# Patient Record
Sex: Female | Born: 1955 | Race: White | Hispanic: No | Marital: Single | Smoking: Never smoker
Health system: Southern US, Community
[De-identification: ages and names within clinical notes are randomized; demographics above are authoritative.]

## PROBLEM LIST (undated history)

## (undated) DIAGNOSIS — E039 Hypothyroidism, unspecified: Secondary | ICD-10-CM

## (undated) DIAGNOSIS — E119 Type 2 diabetes mellitus without complications: Secondary | ICD-10-CM

## (undated) DIAGNOSIS — I10 Essential (primary) hypertension: Secondary | ICD-10-CM

## (undated) DIAGNOSIS — S81809A Unspecified open wound, unspecified lower leg, initial encounter: Secondary | ICD-10-CM

## (undated) DIAGNOSIS — N200 Calculus of kidney: Secondary | ICD-10-CM

## (undated) DIAGNOSIS — I890 Lymphedema, not elsewhere classified: Secondary | ICD-10-CM

## (undated) NOTE — *Deleted (*Deleted)
[facility]  Name: [name]  MRN: [id number] DOB: Sep 18, 1955   Chief Complaint/ HPI  [name],  73 y.o. , female presents for their Initial CCM visit with the clinical pharmacist via telephone due to COVID-19 Pandemic.  PCP : Juline Patch, MD Patient Care Team: Juline Patch, MD as PCP - General (Family Medicine) Ubaldo Glassing Javier Docker, MD as Consulting Physician (Cardiology) Vladimir Faster, [facility] (Pharmacist)  Their chronic conditions include: Hypertension, Hyperlipidemia, Diabetes, Chronic Kidney Disease, Hypothyroidism and Osteoarthritis   Office Visits: 04/30/20- Dr. Ronnald Ramp-  11/05/19- Dr. Ronnald Ramp - bloodwork  Consult Visit: 05/30/19- Cardiology - SOB OE, echo recommended possible HF, determine LV function, ? Lymphedema, compression hose, suggest empagliflozin  No Known Allergies  Medications: Outpatient Encounter Medications as of 05/12/2020  Medication Sig  . acetaminophen (TYLENOL) 500 MG tablet Take 500 mg by mouth every 6 (six) hours as needed.  Marland Kitchen aspirin EC 81 MG tablet Take 1 tablet (81 mg total) by mouth daily.  Marland Kitchen atorvastatin (LIPITOR) 20 MG tablet Take 1 tablet (20 mg total) by mouth daily.  . cloNIDine (CATAPRES) 0.3 MG tablet TAKE ONE TABLET BY MOUTH 3 TIMES DAILY.  . furosemide (LASIX) 40 MG tablet Take 1 tablet (40 mg total) by mouth 2 (two) times daily.  Marland Kitchen glipiZIDE (GLUCOTROL) 5 MG tablet TAKE 1 TABLET BY MOUTH twice a day  . glucose blood test strip Use as instructed  . levothyroxine (SYNTHROID) 125 MCG tablet Take 1 tablet (125 mcg total) by mouth daily before breakfast.  . metoprolol succinate (TOPROL-XL) 50 MG 24 hr tablet Take 1 tablet (50 mg total) by mouth daily.  Marland Kitchen telmisartan (MICARDIS) 80 MG tablet TAKE ONE TABLET BY MOUTH EVERY DAY   No facility-administered encounter medications on file as of 05/12/2020.     Current Diagnosis/Assessment:    Goals Addressed   None       Diabetes   A1c goal <7%   Recent Relevant Labs: Lab Results  Component Value Date/Time   HGBA1C 8.2 (H) 04/30/2020 02:08 PM   HGBA1C 7.4 (H) 11/05/2019 03:44 PM   CMP Latest Ref Rng & Units 04/30/2020 11/05/2019 04/30/2019  Glucose 65 - 99 mg/dL 147(H) 106(H) 128(H)  BUN 8 - 27 mg/dL 28(H) 25 21  Creatinine 0.57 - 1.00 mg/dL 1.58(H) 1.53(H) 1.43(H)  Sodium 134 - 144 mmol/L 136 137 137  Potassium 3.5 - 5.2 mmol/L 5.0 4.8 4.4  Chloride 96 - 106 mmol/L 97 102 101  CO2 20 - 29 mmol/L 21 24 19(L)  Calcium 8.7 - 10.3 mg/dL 9.6 9.2 9.6  Total Protein 6.0 - 8.5 g/dL - 7.8 -  Total Bilirubin 0.0 - 1.2 mg/dL - 0.7 -  Alkaline Phos 39 - 117 IU/L - 88 -  AST 0 - 40 IU/L - 15 -  ALT 0 - 32 IU/L - 9 -   EGFR~33 mL/min  Last diabetic Eye exam: No results found for: HMDIABEYEEXA  Last diabetic Foot exam: No results found for: HMDIABFOOTEX   Checking BG: Daily usually before breakfast She does not check post-prandial Recent FBG Readings: 133 today. Reports her readings are usually in the 130's  She reports episodes of hypoglycemia with most recent 2-3 weeks ago. She states she became "shaky and hungry". Her BG at that time was 89.  Patient is currently uncontrolled on the following medications: Marland Kitchen Glipizide 5 mg bid  We discussed: diet and exercise extensively and signs/symptoms and treatment of hypoglycemia. Patients endorsed  eating too much bread. She does not cook and often eats packaged [employer] or [employer] or sandwiches. She reports eating more sandwiches than usual because tomatoes are in season and she loves tomato sandwiches. She does read labels and tries to purchase items with the least sodium. She drinks two 16-ounce diet, caffeine-free Mountain Dews daily and equally as much water. We discussed the importance of checking post-prandials to help guide future therapy.  We discussed the upward trend in her [id number] and complications associated with uncontrolled diabetes. She is very concerned about her kidney  function and her heart. She does currently exercise due to back pain which causes her walk hunched over. She uses a walker at baseline.Patient would benefit from transition to Empagliflozin. We discussed the benefits and risks including weight loss, minimal hypoglycemia, reduced risk cardiovascular death as well as increased UTI and yeast infections.  Patient does not want to go on any type of injectable therapy including insulin.  Plan  Recommend follow-up HbA1c 3-6 months from last reading given result above goal and upward trend. Consider changing to Empagliflozin at next appointment if eGFR still >30 ml/min.    Hypertension/CKD IV/EDEMA   BP goal is:  <130/80  Office blood pressures are  BP Readings from Last 3 Encounters:  04/30/20 130/80  11/05/19 130/84  04/30/19 (!) 160/90   BMP Latest Ref Rng & Units 04/30/2020 11/05/2019 04/30/2019  Glucose 65 - 99 mg/dL 147(H) 106(H) 128(H)  BUN 8 - 27 mg/dL 28(H) 25 21  Creatinine 0.57 - 1.00 mg/dL 1.58(H) 1.53(H) 1.43(H)  BUN/Creat Ratio 12 - 28 18 16 15   Sodium 134 - 144 mmol/L 136 137 137  Potassium 3.5 - 5.2 mmol/L 5.0 4.8 4.4  Chloride 96 - 106 mmol/L 97 102 101  CO2 20 - 29 mmol/L 21 24 19(L)  Calcium 8.7 - 10.3 mg/dL 9.6 9.2 9.6    Patient checks BP at home infrequently Patient states her monitor needs batteries and can not remember the last time she checked it at home.  Patient has failed these meds in the past: lopressor 25 mg Patient is currently controlled on the following medications:   Clonidine 0.3 tid  Furosemide 40 mg bid  Metoprolol succinate 50 mg bid  Telmisartan 80 mg qd  We discussed diet and exercise extensively. Patient does not cook and eats a lot of packaged meals. She has not followed up with cardiology or scheduled echocardiogram. She wears Juxtalite compression wraps and sits in recliner with legs elevated most of the time. Does not have scales at home. BP monitor needs batteries. She will get batteries  and begin checking BP.  Plan  Continue current medications.  Recommend patient follow up with cardiology for echo.    Hyperlipidemia   LDL goal < 70  Lipid Panel     Component Value Date/Time   CHOL 205 (H) 04/30/2020 1408   TRIG 112 04/30/2020 1408   HDL 49 04/30/2020 1408   LDLCALC 136 (H) 04/30/2020 1408    Hepatic Function Latest Ref Rng & Units 04/30/2020 11/05/2019 04/30/2019  Total Protein 6.0 - 8.5 g/dL - 7.8 -  Albumin 3.8 - 4.8 g/dL 4.1 4.0 4.0  AST 0 - 40 IU/L - 15 -  ALT 0 - 32 IU/L - 9 -  Alk Phosphatase 39 - 117 IU/L - 88 -  Total Bilirubin 0.0 - 1.2 mg/dL - 0.7 -     The 10-year ASCVD risk score Mikey Bussing DC Jr., et al., 2013)  is: 16.7%   Values used to calculate the score:     Age: 20 years     Sex: Female     Is Non-Hispanic African American: No     Diabetic: Yes     Tobacco smoker: No     Systolic Blood Pressure: [id number]     Is BP treated: Yes     HDL Cholesterol: 49 mg/dL     Total Cholesterol: 205 mg/dL   Patient has failed these meds in past: N/A Patient is currently uncontrolled on the following medications:  . Atorvastatin 10 mg qam . Aspirin 81 mg qam  We discussed:  diet and exercise extensively.  We discussed ASCVD risk calculation and LDL goals in patients with diabetes. Patient has limited mobility due to back pain. Uses a walker.  Plan  Recommend increasing to high intensity atorvastatin 40 mg for 50% reduction in LDL to achieve goal levels.   Consider discontinuing aspirin for primary prevention.  Hypothyroidism   Lab Results  Component Value Date/Time   TSH 0.511 04/30/2020 02:08 PM   TSH 0.258 (L) 11/05/2019 03:44 PM    Patient has failed these meds in past: none Patient is currently uncontrolled on the following medications:  . Levothyroxine 125 mcg qam  We discussed:  Taking first thing each morning 30 minutes before food or other medications. Patient denies any heart palpitations, difficulty falling asleep or excessive  sweating.  Plan  Recommend consider further decrease of levothyroxine to 112 mcg and recheck TSH in 6-8 weeks.  Vaccines   Reviewed and discussed patient's vaccination history.    Immunization History  Administered Date(s) Administered  . Tdap 05/03/2018    Plan  Patient is comtemplating COVID vaccine and is not interested in Shingrix or flu vaccine at this time.   Medication Management   Pt uses [facility] pharmacy for all medications Pt endorses 90-100% compliance  We discussed: Patient has prescriptions delivered and likes using [facility] because it is close to her house.   Plan  Continue current medication management strategy    Follow up: 3 month phone visit  Junita Push. Kenton Kingfisher PharmD, [facility] [phone]

---

## 2008-06-27 DIAGNOSIS — E119 Type 2 diabetes mellitus without complications: Secondary | ICD-10-CM

## 2008-06-27 HISTORY — DX: Type 2 diabetes mellitus without complications: E11.9

## 2010-12-21 ENCOUNTER — Encounter: Payer: Self-pay | Admitting: Nurse Practitioner

## 2010-12-21 ENCOUNTER — Encounter: Payer: Self-pay | Admitting: Cardiothoracic Surgery

## 2010-12-26 ENCOUNTER — Encounter: Payer: Self-pay | Admitting: Nurse Practitioner

## 2011-01-26 ENCOUNTER — Encounter: Payer: Self-pay | Admitting: Nurse Practitioner

## 2011-01-26 ENCOUNTER — Encounter: Payer: Self-pay | Admitting: Cardiothoracic Surgery

## 2011-01-28 ENCOUNTER — Ambulatory Visit: Payer: Self-pay | Admitting: Family Medicine

## 2011-02-24 ENCOUNTER — Ambulatory Visit: Payer: Self-pay | Admitting: Family Medicine

## 2011-02-26 ENCOUNTER — Encounter: Payer: Self-pay | Admitting: Nurse Practitioner

## 2011-02-26 ENCOUNTER — Encounter: Payer: Self-pay | Admitting: Cardiothoracic Surgery

## 2012-02-20 ENCOUNTER — Emergency Department: Payer: Self-pay | Admitting: Emergency Medicine

## 2012-02-20 LAB — COMPREHENSIVE METABOLIC PANEL
Albumin: 3.5 g/dL (ref 3.4–5.0)
Alkaline Phosphatase: 82 U/L (ref 50–136)
BUN: 26 mg/dL — ABNORMAL HIGH (ref 7–18)
Bilirubin,Total: 0.8 mg/dL (ref 0.2–1.0)
Calcium, Total: 9.3 mg/dL (ref 8.5–10.1)
Co2: 27 mmol/L (ref 21–32)
Creatinine: 1.31 mg/dL — ABNORMAL HIGH (ref 0.60–1.30)
EGFR (Non-African Amer.): 45 — ABNORMAL LOW
SGPT (ALT): 22 U/L (ref 12–78)
Sodium: 139 mmol/L (ref 136–145)

## 2012-02-20 LAB — CBC
HCT: 37.6 % (ref 35.0–47.0)
HGB: 12.5 g/dL (ref 12.0–16.0)
MCHC: 33.2 g/dL (ref 32.0–36.0)
MCV: 86 fL (ref 80–100)
RDW: 15.1 % — ABNORMAL HIGH (ref 11.5–14.5)
WBC: 5.3 10*3/uL (ref 3.6–11.0)

## 2012-12-31 ENCOUNTER — Encounter: Payer: Self-pay | Admitting: Cardiothoracic Surgery

## 2012-12-31 ENCOUNTER — Encounter: Payer: Self-pay | Admitting: Nurse Practitioner

## 2013-01-16 LAB — WOUND CULTURE

## 2013-01-25 ENCOUNTER — Encounter: Payer: Self-pay | Admitting: Cardiothoracic Surgery

## 2013-01-25 ENCOUNTER — Encounter: Payer: Self-pay | Admitting: Nurse Practitioner

## 2013-02-25 ENCOUNTER — Encounter: Payer: Self-pay | Admitting: Cardiothoracic Surgery

## 2013-03-27 ENCOUNTER — Encounter: Payer: Self-pay | Admitting: Cardiothoracic Surgery

## 2013-04-10 ENCOUNTER — Encounter: Payer: Self-pay | Admitting: Surgery

## 2013-04-27 ENCOUNTER — Encounter: Payer: Self-pay | Admitting: Cardiothoracic Surgery

## 2013-04-27 ENCOUNTER — Encounter: Payer: Self-pay | Admitting: Surgery

## 2013-05-27 ENCOUNTER — Encounter: Payer: Self-pay | Admitting: Cardiothoracic Surgery

## 2013-06-27 ENCOUNTER — Encounter: Payer: Self-pay | Admitting: Cardiothoracic Surgery

## 2013-07-28 ENCOUNTER — Encounter: Payer: Self-pay | Admitting: Cardiothoracic Surgery

## 2013-08-25 ENCOUNTER — Encounter: Payer: Self-pay | Admitting: Cardiothoracic Surgery

## 2013-09-16 ENCOUNTER — Encounter: Payer: Self-pay | Admitting: Surgery

## 2013-09-25 ENCOUNTER — Encounter: Payer: Self-pay | Admitting: Surgery

## 2013-09-25 ENCOUNTER — Encounter: Payer: Self-pay | Admitting: Cardiothoracic Surgery

## 2013-10-25 ENCOUNTER — Encounter: Payer: Self-pay | Admitting: Cardiothoracic Surgery

## 2013-10-25 ENCOUNTER — Encounter: Payer: Self-pay | Admitting: Surgery

## 2013-11-25 ENCOUNTER — Encounter: Payer: Self-pay | Admitting: Cardiothoracic Surgery

## 2013-12-02 ENCOUNTER — Encounter: Payer: Self-pay | Admitting: Internal Medicine

## 2013-12-16 ENCOUNTER — Encounter: Payer: Self-pay | Admitting: Surgery

## 2013-12-25 ENCOUNTER — Encounter: Payer: Self-pay | Admitting: Surgery

## 2013-12-25 ENCOUNTER — Encounter: Payer: Self-pay | Admitting: Internal Medicine

## 2013-12-25 ENCOUNTER — Encounter: Payer: Self-pay | Admitting: Cardiothoracic Surgery

## 2014-01-25 ENCOUNTER — Encounter: Payer: Self-pay | Admitting: Cardiothoracic Surgery

## 2014-01-25 ENCOUNTER — Encounter: Payer: Self-pay | Admitting: Surgery

## 2014-01-25 ENCOUNTER — Encounter: Payer: Self-pay | Admitting: Internal Medicine

## 2014-02-21 ENCOUNTER — Ambulatory Visit: Payer: Self-pay | Admitting: Family Medicine

## 2014-02-25 ENCOUNTER — Encounter: Payer: Self-pay | Admitting: Surgery

## 2014-02-25 ENCOUNTER — Encounter: Payer: Self-pay | Admitting: Cardiothoracic Surgery

## 2014-03-27 ENCOUNTER — Encounter: Payer: Self-pay | Admitting: Cardiothoracic Surgery

## 2014-04-27 ENCOUNTER — Encounter: Payer: Self-pay | Admitting: Cardiothoracic Surgery

## 2014-05-27 ENCOUNTER — Encounter: Payer: Self-pay | Admitting: Surgery

## 2014-06-27 ENCOUNTER — Encounter: Payer: Self-pay | Admitting: Cardiothoracic Surgery

## 2014-07-28 ENCOUNTER — Encounter: Payer: Self-pay | Admitting: Cardiothoracic Surgery

## 2014-08-18 ENCOUNTER — Encounter: Payer: Self-pay | Admitting: Surgery

## 2014-08-26 ENCOUNTER — Encounter
Admit: 2014-08-26 | Disposition: A | Discharge status: Home / Self Care | Payer: Self-pay | Attending: Surgery | Admitting: Surgery

## 2014-09-26 ENCOUNTER — Encounter
Admit: 2014-09-26 | Disposition: A | Discharge status: Home / Self Care | Payer: Self-pay | Attending: Surgery | Admitting: Surgery

## 2014-10-27 LAB — WOUND AEROBIC CULTURE

## 2014-10-29 ENCOUNTER — Encounter: Payer: Managed Care, Other (non HMO) | Attending: General Surgery | Admitting: Surgery

## 2014-10-29 DIAGNOSIS — E669 Obesity, unspecified: Secondary | ICD-10-CM | POA: Diagnosis not present

## 2014-10-29 DIAGNOSIS — L97219 Non-pressure chronic ulcer of right calf with unspecified severity: Secondary | ICD-10-CM | POA: Insufficient documentation

## 2014-10-29 DIAGNOSIS — I890 Lymphedema, not elsewhere classified: Secondary | ICD-10-CM | POA: Insufficient documentation

## 2014-10-29 DIAGNOSIS — E119 Type 2 diabetes mellitus without complications: Secondary | ICD-10-CM | POA: Diagnosis not present

## 2014-10-29 DIAGNOSIS — B353 Tinea pedis: Secondary | ICD-10-CM | POA: Diagnosis not present

## 2014-10-29 NOTE — Progress Notes (Signed)
MAHAK, LEBEDA ([id number]) Visit Report for 10/29/2014 Arrival Information Details Patient Name: [name], [name]. Date of Service: 10/29/2014 8:15 AM Medical Record Number: [id number] Patient Account Number: [id number] Date of Birth/Sex: 1955-09-21 (59 y.o. Female) Treating [name]: Montey Hora Primary Care Physician: [name] Other Clinician: Referring Physician: referred, self Treating Physician/Extender: BURNS, Charlean Sanfilippo in Treatment: 61 Visit Information History Since Last Visit Added or deleted any medications: No Patient Arrived: Cane Any new allergies or adverse reactions: No Arrival Time: 08:38 Had a fall or experienced change in No Accompanied By: self activities of daily living that may affect Transfer Assistance: None risk of falls: Patient Identification Verified: Yes Signs or symptoms of abuse/neglect since last No Secondary Verification Process Yes visito Completed: Hospitalized since last visit: No Patient Requires Transmission- No Pain Present Now: No Based Precautions: Patient Has Alerts: Yes Patient Alerts: Patient on Blood Thinner Electronic Signature(s) Signed: 10/29/2014 4:34:55 PM By: Montey Hora Entered By: Montey Hora on 10/29/2014 08:38:45 [name] ([id number]) -------------------------------------------------------------------------------- Encounter Discharge Information Details Patient Name: [name]. Date of Service: 10/29/2014 8:15 AM Medical Record Number: [id number] Patient Account Number: [id number] Date of Birth/Sex: [birth year] (59 y.o. Female) Treating [name]: Montey Hora Primary Care Physician: [name] Other Clinician: Referring Physician: referred, self Treating Physician/Extender: BURNS, Charlean Sanfilippo in Treatment: 3 Encounter Discharge Information Items Discharge Pain Level: 0 Discharge Condition: Stable Ambulatory Status: Cane Discharge Destination: Home Transportation: Other Accompanied By:  self Schedule Follow-up Appointment: Yes Medication Reconciliation completed No and provided to Patient/Care Branndon Tuite: Provided on Clinical Summary of Care: 10/29/2014 Form Type Recipient Paper Patient GI Electronic Signature(s) Signed: 10/29/2014 9:56:54 AM By: Ruthine Dose Previous Signature: 10/29/2014 9:55:02 AM Version By: Montey Hora Entered By: Ruthine Dose on 10/29/2014 09:56:54 [name] ([id number]) -------------------------------------------------------------------------------- Lower Extremity Assessment Details Patient Name: [name]. Date of Service: 10/29/2014 8:15 AM Medical Record Number: [id number] Patient Account Number: [id number] Date of Birth/Sex: April 04, 1956 (59 y.o. Female) Treating [name]: Montey Hora Primary Care Physician: [name] Other Clinician: Referring Physician: referred, self Treating Physician/Extender: BURNS, Charlean Sanfilippo in Treatment: 83 Edema Assessment Assessed: [Left: No] [Right: No] E[Left: dema] [Right: :] Calf Left: Right: Point of Measurement: 24 cm From Medial Instep 44.3 cm 40.8 cm Ankle Left: Right: Point of Measurement: 8 cm From Medial Instep 24 cm 23.2 cm Vascular Assessment Pulses: Posterior Tibial Palpable: [Left:Yes] [Right:Yes] Dorsalis Pedis Palpable: [Left:Yes] [Right:Yes] Extremity colors, hair growth, and conditions: Extremity Color: [Left:Hyperpigmented] [Right:Hyperpigmented] Hair Growth on Extremity: [Left:No] [Right:No] Temperature of Extremity: [Left:Warm] [Right:Warm] Capillary Refill: [Left:< 3 seconds] [Right:< 3 seconds] Toe Nail Assessment Left: Right: Thick: Yes Yes Discolored: Yes Yes Deformed: Yes Yes Improper Length and Hygiene: Yes Yes Electronic Signature(s) Signed: 10/29/2014 4:34:55 PM By: Montey Hora Entered By: Montey Hora on 10/29/2014 08:49:31 [name] ([id number]) [name], [name]  ([id number]) -------------------------------------------------------------------------------- Multi Wound Chart Details Patient Name: [name]. Date of Service: 10/29/2014 8:15 AM Medical Record Number: [id number] Patient Account Number: [id number] Date of Birth/Sex: 02-19-1956 (58 y.o. Female) Treating [name]: Baruch Gouty, [name], BSN, Velva Harman Primary Care Physician: [name] Other Clinician: Referring Physician: referred, self Treating Physician/Extender: BURNS, WALTER Weeks in Treatment: 72 Vital Signs Height(in): 65 Pulse(bpm): 57 Weight(lbs): 322 Blood Pressure 207/70 (mmHg): Body Mass Index(BMI): 54 Temperature(F): 97.6 Respiratory Rate 18 (breaths/min): Photos: [45:No Photos] [46:No Photos] [47:No Photos] Wound Location: [45:Right Lower Leg - Lateral, Proximal] [46:Left, Lateral Lower Leg] [47:Right, Medial Lower Leg] Wounding Event: [45:Blister] [46:Blister] [47:Blister] Primary Etiology: [45:Venous Leg Ulcer] [46:Lymphedema] [47:Lymphedema] Comorbid  History: [45:Lymphedema, Hypertension, Type II Diabetes] [46:N/A] [47:N/A] Date Acquired: [45:10/15/2014] [46:10/22/2014] [47:10/22/2014] Weeks of Treatment: [45:2] [46:1] [47:1] Wound Status: [45:Open] [46:Open] [47:Open] Measurements L x W x D 0.5x0.8x0.1 [46:0x0x0] [47:1x0.8x0.1] (cm) Area (cm) : [45:0.314] [46:0] [47:0.628] Volume (cm) : [45:0.031] [46:0] [47:0.063] % Reduction in Area: [45:97.50%] [46:100.00%] [47:89.00%] % Reduction in Volume: 97.50% [46:100.00%] [47:89.00%] Classification: [45:Partial Thickness] [46:Partial Thickness] [47:Partial Thickness] HBO Classification: [45:Grade 1] [46:N/A] [47:N/A] Exudate Amount: [45:Small] [46:N/A] [47:N/A] Exudate Type: [45:Serous] [46:N/A] [47:N/A] Exudate Color: [45:amber] [46:N/A] [47:N/A] Wound Margin: [45:Distinct, outline attached] [46:N/A] [47:N/A] Granulation Amount: [45:Large (67-100%)] [46:N/A] [47:N/A] Granulation Quality: [45:Red] [46:N/A] [47:N/A] Necrotic  Amount: [45:None Present (0%)] [46:N/A] [47:N/A] Exposed Structures: [45:Fascia: No Fat: No Tendon: No Muscle: No] [46:N/A] [47:N/A] Joint: No Bone: No Limited to Skin Breakdown Epithelialization: Medium (34-66%) N/A N/A Debridement: N/A N/A N/A Time-Out Taken: N/A N/A N/A Pain Control: N/A N/A N/A Tissue Debrided: N/A N/A N/A Level: N/A N/A N/A Debridement Area (sq N/A N/A N/A cm): Instrument: N/A N/A N/A Bleeding: N/A N/A N/A Procedural Pain: N/A N/A N/A Post Procedural Pain: N/A N/A N/A Debridement Treatment N/A N/A N/A Response: Post Debridement N/A N/A N/A Measurements L x W x D (cm) Post Debridement N/A N/A N/A Volume: (cm) Periwound Skin Texture: Edema: No No Abnormalities Noted No Abnormalities Noted Excoriation: No Induration: No Callus: No Crepitus: No Fluctuance: No Friable: No Rash: No Scarring: No Periwound Skin Maceration: No No Abnormalities Noted No Abnormalities Noted Moisture: Moist: No Dry/Scaly: No Periwound Skin Color: Atrophie Blanche: No No Abnormalities Noted No Abnormalities Noted Cyanosis: No Ecchymosis: No Erythema: No Hemosiderin Staining: No Mottled: No Pallor: No Rubor: No Temperature: No Abnormality N/A N/A Tenderness on No No No Palpation: Wound Preparation: Ulcer Cleansing: Other: N/A N/A soap and water Topical Anesthetic [name], [name] ([id number]) Applied: Other: lidocaine 4% Procedures Performed: N/A N/A N/A Wound Number: 48 N/A N/A Photos: No Photos N/A N/A Wound Location: Right, Lateral Lower Leg N/A N/A Wounding Event: Blister N/A N/A Primary Etiology: Venous Leg Ulcer N/A N/A Comorbid History: N/A N/A N/A Date Acquired: 10/29/2014 N/A N/A Weeks of Treatment: 0 N/A N/A Wound Status: Open N/A N/A Measurements L x W x D 7.5x1.9x0.1 N/A N/A (cm) Area (cm) : 11.192 N/A N/A Volume (cm) : 1.119 N/A N/A % Reduction in Area: N/A N/A N/A % Reduction in Volume: N/A N/A N/A Classification: N/A N/A N/A HBO  Classification: N/A N/A N/A Exudate Amount: N/A N/A N/A Exudate Type: N/A N/A N/A Exudate Color: N/A N/A N/A Wound Margin: N/A N/A N/A Granulation Amount: N/A N/A N/A Granulation Quality: N/A N/A N/A Necrotic Amount: N/A N/A N/A Exposed Structures: N/A N/A N/A Epithelialization: N/A N/A N/A Debridement: Debridement [id number] N/A N/A [id number]) Time-Out Taken: Yes N/A N/A Pain Control: Lidocaine 4% Topical N/A N/A Solution Tissue Debrided: Fibrin/Slough, Exudates N/A N/A Level: Skin/Subcutaneous N/A N/A Tissue Debridement Area (sq 14.25 N/A N/A cm): Instrument: Curette N/A N/A Bleeding: None N/A N/A Procedural Pain: 0 N/A N/A Post Procedural Pain: 0 N/A N/A Debridement Treatment Procedure was tolerated N/A N/A Response: well Post Debridement 7.5x1.9x0.1 N/A N/A Measurements L x W x D (cm) [name], [name] ([id number]) Post Debridement 1.119 N/A N/A Volume: (cm) Periwound Skin Texture: No Abnormalities Noted N/A N/A Periwound Skin No Abnormalities Noted N/A N/A Moisture: Periwound Skin Color: No Abnormalities Noted N/A N/A Temperature: N/A N/A N/A Tenderness on No N/A N/A Palpation: Wound Preparation: N/A N/A N/A Procedures Performed: Debridement N/A N/A Treatment Notes Electronic Signature(s) Signed: 10/29/2014 1:15:46 PM By:  [name], [employer], [name] Entered By: Regan Lemming on 10/29/2014 09:17:49 [name] ([id number]) -------------------------------------------------------------------------------- Multi-Disciplinary Care Plan Details Patient Name: [name] Date of Service: 10/29/2014 8:15 AM Medical Record Number: [id number] Patient Account Number: [id number] Date of Birth/Sex: April 06, 1956 (59 y.o. Female) Treating [name]: Afful, [name], BSN, Velva Harman Primary Care Physician: [name] Other Clinician: Referring Physician: referred, self Treating Physician/Extender: BURNS, Charlean Sanfilippo in Treatment: 79 Active Inactive Nutrition Nursing Diagnoses: Impaired  glucose control: actual or potential Goals: Patient/caregiver verbalizes understanding of need to maintain therapeutic glucose control per primary care physician Date Initiated: 12/16/2013 Goal Status: Active Interventions: Provide education on elevated blood sugars and impact on wound healing Notes: Soft Tissue Infection Nursing Diagnoses: Impaired tissue integrity Goals: Patient will remain free of wound infection Date Initiated: 12/16/2013 Goal Status: Active Interventions: Assess signs and symptoms of infection every visit Notes: Venous Leg Ulcer Nursing Diagnoses: Knowledge deficit related to disease process and management Potential for venous Insuffiency (use before diagnosis confirmed) GoalsDAJANAY, [name] ([id number]) Patient will maintain optimal edema control Date Initiated: 08/18/2014 Goal Status: Active Interventions: Assess peripheral edema status every visit. Compression as ordered Treatment Activities: Therapeutic compression applied : 10/29/2014 Notes: Electronic Signature(s) Signed: 10/29/2014 1:15:46 PM By: Regan Lemming BSN, [name] Entered By: Regan Lemming on 10/29/2014 09:17:38 [name] ([id number]) -------------------------------------------------------------------------------- Patient/Caregiver Education Details Patient Name: [name] Date of Service: 10/29/2014 8:15 AM Medical Record Number: [id number] Patient Account Number: [id number] Date of Birth/Gender: [birth year] (59 y.o. Female) Treating [name]: Montey Hora Primary Care Physician: [name] Other Clinician: Referring Physician: referred, self Treating Physician/Extender: BURNS, Charlean Sanfilippo in Treatment: 74 Education Assessment Education Provided To: Patient Education Topics Provided Venous: Handouts: Other: call Hoyle Sauer r/t compression hose Methods: Demonstration, Explain/Verbal Responses: State content correctly Electronic Signature(s) Signed: 10/29/2014 9:55:25 AM  By: Montey Hora Entered By: Montey Hora on 10/29/2014 09:55:25 [name] ([id number]) -------------------------------------------------------------------------------- Wound Assessment Details Patient Name: [name]. Date of Service: 10/29/2014 8:15 AM Medical Record Number: [id number] Patient Account Number: [id number] Date of Birth/Sex: 1955-11-29 (59 y.o. Female) Treating [name]: Montey Hora Primary Care Physician: [name] Other Clinician: Referring Physician: referred, self Treating Physician/Extender: BURNS, WALTER Weeks in Treatment: 83 Wound Status Wound Number: 27 Primary Venous Leg Ulcer Etiology: Wound Location: Right Lower Leg - Lateral, Proximal Wound Status: Open Wounding Event: Blister Comorbid Lymphedema, Hypertension, Type II History: Diabetes Date Acquired: 10/15/2014 Weeks Of Treatment: 2 Clustered Wound: No Photos Photo Uploaded By: Montey Hora on 10/29/2014 13:07:16 Wound Measurements Length: (cm) 0.5 Width: (cm) 0.8 Depth: (cm) 0.1 Area: (cm) 0.314 Volume: (cm) 0.031 % Reduction in Area: 97.5% % Reduction in Volume: 97.5% Epithelialization: Medium (34-66%) Tunneling: No Undermining: No Wound Description 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 ([id number]) Necrotic Amount: None Present (0%) Fat Layer Exposed: No Tendon Exposed: No Muscle Exposed: No Joint Exposed: No Bone Exposed: No Limited to Skin Breakdown Periwound Skin Texture Texture Color No Abnormalities Noted: No No Abnormalities Noted: No Callus: No Atrophie Blanche: No Crepitus: No Cyanosis: No Excoriation: No Ecchymosis: No Fluctuance: No Erythema:  No Friable: No Hemosiderin Staining: No Induration: No Mottled: No Localized Edema: No Pallor: No Rash: No Rubor: No Scarring: No Temperature / Pain Moisture Temperature: No Abnormality No Abnormalities Noted: No Dry / Scaly:  No Maceration: No Moist: No Wound Preparation Ulcer Cleansing: Other: soap and water , Topical Anesthetic Applied: Other: lidocaine 4%, Treatment Notes Wound #45 (Right, Proximal, Lateral Lower Leg) 1. Cleansed with: Cleanse wound with antibacterial soap and water 2. Anesthetic Topical Lidocaine 4% cream to wound bed prior to debridement 3. Peri-wound Care: Moisturizing lotion 4. Dressing Applied: Iodoflex 5. Secondary Dressing Applied ABD Pad 7. Secured with 4 Layer Compression System - Bilateral Electronic Signature(s) Signed: 10/29/2014 11:43:56 AM By: Montey Hora Previous Signature: 10/29/2014 9:14:02 AM Version By: [name], [name] ([id number]) Entered By: Montey Hora on 10/29/2014 11:43:56 [name] ([id number]) -------------------------------------------------------------------------------- Wound Assessment Details Patient Name: [name]. Date of Service: 10/29/2014 8:15 AM Medical Record Number: [id number] Patient Account Number: [id number] Date of Birth/Sex: [birth year] (59 y.o. Female) Treating [name]: Montey Hora Primary Care Physician: [name] Other Clinician: Referring Physician: referred, self Treating Physician/Extender: BURNS, WALTER Weeks in Treatment: 83 Wound Status Wound Number: 46 Primary Etiology: Lymphedema Wound Location: Left, Lateral Lower Leg Wound Status: Open Wounding Event: Blister Date Acquired: 10/22/2014 Weeks Of Treatment: 1 Clustered Wound: No Photos Photo Uploaded By: Montey Hora on 10/29/2014 13:07:17 Wound Measurements Length: (cm) 0 % Reduction Width: (cm) 0 % Reduction Depth: (cm) 0 Area: (cm) 0 Volume: (cm) 0 in Area: 100% in Volume:  100% Wound Description Classification: Partial Thickness Periwound Skin Texture Texture Color No Abnormalities Noted: No No Abnormalities Noted: No Moisture No Abnormalities Noted: No Electronic Signature(s) Signed: 10/29/2014 4:34:55 PM By: [name], [name] ([id number]) Entered By: Montey Hora on 10/29/2014 08:59:12 [name] ([id number]) -------------------------------------------------------------------------------- Wound Assessment Details Patient Name: [name]. Date of Service: 10/29/2014 8:15 AM Medical Record Number: [id number] Patient Account Number: [id number] Date of Birth/Sex: 1955-10-22 (59 y.o. Female) Treating [name]: Montey Hora Primary Care Physician: [name] Other Clinician: Referring Physician: referred, self Treating Physician/Extender: BURNS, WALTER Weeks in Treatment: 83 Wound Status Wound Number: 47 Primary Lymphedema Etiology: Wound Location: Right Lower Leg - Medial Wound Status: Open Wounding Event: Blister Comorbid Lymphedema, Hypertension, Type II Date Acquired: 10/22/2014 History: Diabetes Weeks Of Treatment: 1 Clustered Wound: No Photos Photo Uploaded By: Montey Hora on 10/29/2014 13:08:02 Wound Measurements Length: (cm) 1 Width: (cm) 0.8 Depth: (cm) 0.1 Area: (cm) 0.628 Volume: (cm) 0.063 % Reduction in Area: 89% % Reduction in Volume: 89% Epithelialization: Small (1-33%) Tunneling: No Undermining: No Wound Description Classification: Partial Thickness Diabetic Severity (Wagner): Grade 1 Wound Margin: Distinct, outline attach Exudate Amount: Medium Exudate Type: Serous Exudate Color: amber Foul Odor After Cleansing: No ed Wound Bed Granulation Amount: Large (67-100%) Exposed Structure Granulation Quality: Red Fascia Exposed: No Necrotic Amount: None Present (0%) Fat Layer Exposed: No [name], [name] ([id number]) Tendon Exposed: No Muscle Exposed: No Joint Exposed: No Bone  Exposed: No Limited to Skin Breakdown Periwound Skin Texture Texture Color No Abnormalities Noted: No No Abnormalities Noted: No Callus: No Atrophie Blanche: No Crepitus: No Cyanosis: No Excoriation: No Ecchymosis: No Fluctuance: No Erythema: No Friable: No Hemosiderin Staining: No Induration: No Mottled: No Localized Edema: No Pallor: No Rash: No Rubor: No Scarring: No Temperature / Pain Moisture Temperature: No Abnormality No Abnormalities Noted: No Dry / Scaly: No Maceration: No Moist: No Wound Preparation Ulcer Cleansing: Other: soap and water, Topical Anesthetic Applied: Other: lidocaine 4%, Treatment Notes Wound #47 (Right, Medial Lower Leg) 1. Cleansed with: Cleanse wound with antibacterial soap and water 2. Anesthetic Topical Lidocaine 4% cream to wound bed prior to debridement 3. Peri-wound Care: Moisturizing  lotion 4. Dressing Applied: Iodoflex 5. Secondary Dressing Applied ABD Pad 7. Secured with 4 Layer Compression System - Bilateral Electronic Signature(s) Signed: 10/29/2014 11:44:33 AM By: Montey Hora Entered By: Montey Hora on 10/29/2014 11:44:33 [name] ([name] ([id number]) -------------------------------------------------------------------------------- Wound Assessment Details Patient Name: [name]. Date of Service: 10/29/2014 8:15 AM Medical Record Number: [id number] Patient Account Number: [id number] Date of Birth/Sex: 1955-08-23 (59 y.o. Female) Treating [name]: Montey Hora Primary Care Physician: [name] Other Clinician: Referring Physician: referred, self Treating Physician/Extender: BURNS, WALTER Weeks in Treatment: 83 Wound Status Wound Number: 59 Primary Venous Leg Ulcer Etiology: Wound Location: Right Lower Leg - Lateral Wound Status: Open Wounding Event: Blister Comorbid Lymphedema, Hypertension, Type II Date Acquired: 10/29/2014 History: Diabetes Weeks Of Treatment:  0 Clustered Wound: No Photos Photo Uploaded By: Montey Hora on 10/29/2014 13:08:03 Wound Measurements Length: (cm) 7.5 Width: (cm) 1.9 Depth: (cm) 0.1 Area: (cm) 11.192 Volume: (cm) 1.119 % Reduction in Area: 0% % Reduction in Volume: 0% Epithelialization: None Tunneling: No Undermining: No Wound Description Classification: Partial Thickness Foul O Diabetic Severity (Wagner): Grade 1 Wound Margin: Distinct, outline attached Exudate Amount: Medium Exudate Type: Serous Exudate Color: amber dor After Cleansing: No Wound Bed Granulation Amount: Large (67-100%) Exposed Structure Granulation Quality: Red Fascia Exposed: No Necrotic Amount: None Present (0%) Fat Layer Exposed: No [name], [name] ([id number]) Tendon Exposed: No Muscle Exposed: No Joint Exposed: No Bone Exposed: No Limited to Skin Breakdown Periwound Skin Texture Texture Color No Abnormalities Noted: No No Abnormalities Noted: No Callus: No Atrophie Blanche: No Crepitus: No Cyanosis: No Excoriation: No Ecchymosis: No Fluctuance: No Erythema: No Friable: No Hemosiderin Staining: No Induration: No Mottled: No Localized Edema: No Pallor: No Rash: No Rubor: No Scarring: No Temperature / Pain Moisture Temperature: No Abnormality No Abnormalities Noted: No Dry / Scaly: No Maceration: No Moist: No Wound Preparation Ulcer Cleansing: Other: soap and water, Topical Anesthetic Applied: Other: lidocaine 4%, Treatment Notes Wound #48 (Right, Lateral Lower Leg) 1. Cleansed with: Cleanse wound with antibacterial soap and water 2. Anesthetic Topical Lidocaine 4% cream to wound bed prior to debridement 3. Peri-wound Care: Moisturizing lotion 4. Dressing Applied: Iodoflex 5. Secondary Dressing Applied ABD Pad 7. Secured with 4 Layer Compression System - Bilateral Electronic Signature(s) Signed: 10/29/2014 11:45:24 AM By: Montey Hora Entered By: Montey Hora on 10/29/2014  11:45:24 [name] ([name] ([id number]) -------------------------------------------------------------------------------- Vitals Details Patient Name: [name] Date of Service: 10/29/2014 8:15 AM Medical Record Number: [id number] Patient Account Number: [id number] Date of Birth/Sex: 1955/09/12 (59 y.o. Female) Treating [name]: Montey Hora Primary Care Physician: [name] Other Clinician: Referring Physician: referred, self Treating Physician/Extender: BURNS, WALTER Weeks in Treatment: 81 Vital Signs Time Taken: 08:39 Temperature (F): 97.6 Height (in): 65 Pulse (bpm): 57 Weight (lbs): 322 Respiratory Rate (breaths/min): 18 Body Mass Index (BMI): 53.6 Blood Pressure (mmHg): 207/70 Reference Range: 80 - 120 mg / dl Electronic Signature(s) Signed: 10/29/2014 4:34:55 PM By: Montey Hora Entered By: Montey Hora on 10/29/2014 08:39:59

## 2014-10-29 NOTE — Progress Notes (Addendum)
MADESYN, PANGELINAN ([id number]) Visit Report for 10/29/2014 Chief Complaint Document Details Patient Name: [name], [name]. Date of Service: 10/29/2014 8:15 AM Medical Record Number: [id number] Patient Account Number: [id number] Date of Birth/Sex: [birth year] (59 y.o. 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 New ulcers on R. All healed on L. Electronic Signature(s) Signed: 10/29/2014 4:29:30 PM By: Loletha Grayer MD Entered By: Loletha Grayer on 10/29/2014 13:40:02 [name] ([id number]) -------------------------------------------------------------------------------- Debridement Details Patient Name: [name] Date of Service: 10/29/2014 8:15 AM Medical Record Number: [id number] Patient Account Number: [id number] Date of Birth/Sex: [birth year] (59 y.o. Female) Treating RN: Montey Hora Primary Care Physician: [name] Other Clinician: Referring Physician: referred, self Treating Physician/Extender: BURNS, Charlean Sanfilippo in Treatment: 83 Debridement Performed for Wound #48 Right,Lateral Lower Leg Assessment: Performed By: Physician BURNS, Teressa Senter., MD Debridement: Debridement Pre-procedure Yes Verification/Time Out Taken: Start Time: 09:15 Pain Control: Lidocaine 4% Topical Solution Level: Skin/Subcutaneous Tissue Total Area Debrided (L x 7.5 (cm) x 1.9 (cm) = 14.25 (cm) W): Tissue and other Non-Viable, Exudate, Fibrin/Slough material debrided: Instrument: Curette Bleeding: None End Time: 09:15 Procedural Pain: 0 Post Procedural Pain: 0 Response to Treatment: Procedure was tolerated well Post Debridement Measurements of Total Wound Length: (cm) 7.5 Width: (cm)  1.9 Depth: (cm) 0.2 Volume: (cm) 2.238 Electronic Signature(s) Signed: 10/29/2014 4:29:30 PM By: Loletha Grayer MD Signed: 10/29/2014 4:34:55 PM By: Montey Hora Previous Signature: 10/29/2014 1:15:46 PM Version By: Regan Lemming BSN, RN Entered By: Loletha Grayer on 10/29/2014 13:39:40 [name] ([id number]) -------------------------------------------------------------------------------- HPI Details Patient Name: [name]. Date of Service: 10/29/2014 8:15 AM Medical Record Number: [id number] Patient Account Number: [id number] Date of Birth/Sex: [birth year] (59 y.o. Female) Treating RN: Montey Hora Primary Care Physician: [name] Other Clinician: Referring Physician: referred, self Treating Physician/Extender: BURNS, WALTER Weeks in Treatment: 46 History of Present Illness HPI Description: 59yo w/ h/o BLE phlebolymphedema, type 2 DM, and obesity. Has a h/o chronic, recurrent BLE calf ulcers. Tolerating 4 layer compression. Ulcers recently healed, but recurred. Infrequently uses lymphedema pump. Awaiting lymphedema clinic visit. In the past has developed recurrent ulcers when compression bandages discontinued. She returns to clinic today and complains of new R calf ulcers. Mild pain. No F/C. Moderate clear drainage. Electronic Signature(s) Signed: 10/29/2014 4:29:30 PM By: Loletha Grayer MD Entered By: Loletha Grayer on 10/29/2014 13:40:40 [name] ([id number]) -------------------------------------------------------------------------------- Physical Exam Details Patient Name: [name] Date of Service: 10/29/2014 8:15 AM Medical Record Number: [id number] Patient Account Number: [id number] Date of Birth/Sex: 01-16-1956 (59 y.o. Female) Treating RN: Montey Hora Primary Care Physician: [name] Other Clinician: Referring Physician: referred, self Treating Physician/Extender: BURNS, WALTER Weeks in Treatment:  56 Constitutional . Pulse regular. Respirations normal and unlabored. Afebrile. . Notes New right lower extremity ulcers. No cellulitis. No left calf ulcerations. Palpable DP bilaterally. Electronic Signature(s) Signed: 10/29/2014 4:29:30 PM By: Loletha Grayer MD Entered By: Loletha Grayer on 10/29/2014 13:41:23 [name] ([id number]) -------------------------------------------------------------------------------- Physician Orders Details Patient Name: [name] Date of Service: 10/29/2014 8:15 AM Medical Record Number: [id number] Patient Account Number: [id number] Date of Birth/Sex: 15-Mar-1956 (59 y.o. Female) Treating RN: Baruch Gouty, RN, BSN, Velva Harman Primary Care Physician: [name] Other Clinician: Referring Physician: referred, self Treating Physician/Extender: BURNS, WALTER Weeks in Treatment: 60  Verbal / Phone Orders: Yes Clinician: Afful, RN, BSN, Velva Harman Read Back and Verified: Yes Diagnosis Coding Wound Cleansing Wound #45 Right,Proximal,Lateral Lower Leg o Cleanse wound with mild soap and water o May shower with protection. Wound #47 Right,Medial Lower Leg o Cleanse wound with mild soap and water o May shower with protection. Wound #48 Right,Lateral Lower Leg o Cleanse wound with mild soap and water o May shower with protection. Skin Barriers/Peri-Wound Care Wound #45 Right,Proximal,Lateral Lower Leg o Barrier cream Wound #47 Right,Medial Lower Leg o Barrier cream Wound #48 Right,Lateral Lower Leg o Barrier cream Primary Wound Dressing Wound #45 Right,Proximal,Lateral Lower Leg o Iodoflex Wound #47 Right,Medial Lower Leg o Iodoflex Wound #48 Right,Lateral Lower Leg o Iodoflex Secondary Dressing Wound #45 Right,Proximal,Lateral Lower Leg o Gauze, ABD and Kerlix/Conform Spalla, Lille K. ([id number]) Wound #47 Right,Medial Lower Leg o Gauze, ABD and Kerlix/Conform Wound #48 Right,Lateral Lower Leg o Gauze,  ABD and Kerlix/Conform Dressing Change Frequency Wound #45 Right,Proximal,Lateral Lower Leg o Change dressing every week Wound #47 Right,Medial Lower Leg o Change dressing every week Wound #48 Right,Lateral Lower Leg o Change dressing every week Follow-up Appointments Wound #45 Right,Proximal,Lateral Lower Leg o Return Appointment in 1 week. Wound #47 Right,Medial Lower Leg o Return Appointment in 1 week. Wound #48 Right,Lateral Lower Leg o Return Appointment in 1 week. Edema Control Wound #45 Right,Proximal,Lateral Lower Leg o 4 Layer Compression System - Bilateral Wound #47 Right,Medial Lower Leg o 4 Layer Compression System - Bilateral Wound #48 Right,Lateral Lower Leg o 4 Layer Compression System - Bilateral Notes Patient given the phone number for Lymphedema Specialist to call her for custom compression fitting. Electronic Signature(s) Signed: 10/29/2014 9:20:25 AM By: Regan Lemming BSN, RN Signed: 10/29/2014 4:29:30 PM By: Loletha Grayer MD Entered By: Regan Lemming on 10/29/2014 09:20:25 [name] ([id number]) -------------------------------------------------------------------------------- Problem List Details Patient Name: [name]. Date of Service: 10/29/2014 8:15 AM Medical Record Number: [id number] Patient Account Number: [id number] Date of Birth/Sex: [birth year] (60 y.o. Female) Treating RN: Montey Hora Primary Care Physician: [name] Other Clinician: Referring Physician: referred, self Treating Physician/Extender: BURNS, Charlean Sanfilippo in Treatment: 74 Active Problems ICD-9 Encounter Code Description Active Date Diagnosis 457.1 Other Lymphedema - excludes congenital, eyelid or vulva 03/27/2013 Yes 459.33 Chronic venous hypertension with ulcer and inflammation 03/27/2013 Yes 707.12 Ulcer of lower limbs, except pressure ulcer; Ulcer of calf 03/27/2013 Yes ICD-10 Encounter Code Description Active Date Diagnosis I89.0  Lymphedema, not elsewhere classified 03/26/2014 Yes E66.9 Obesity, unspecified 03/26/2014 Yes B35.3 Tinea pedis 08/01/2014 Yes 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 08/27/2014 Yes E11.621 Type 2 diabetes mellitus with foot ulcer 10/08/2014 Yes Inactive Problems Resolved Problems ICD-10 Code Description Active Date Resolved Date ARYIAH, NUANEZ ([id number]) I83.022 Varicose veins of left lower extremity with ulcer of calf 03/26/2014 S80.821A Blister (nonthermal), right lower leg, initial encounter 10/16/2014 10/16/2014 L03.115 Cellulitis of right lower limb 10/22/2014 10/22/2014 Electronic Signature(s) Signed: 10/29/2014 4:29:30 PM By: Loletha Grayer MD Entered By: Loletha Grayer on 10/29/2014 13:39:16 [name] ([id number]) -------------------------------------------------------------------------------- Progress Note Details Patient Name: [name]. Date of Service: 10/29/2014 8:15 AM Medical Record Number: [id number] Patient Account Number: [id number] Date of Birth/Sex: Nov 16, 1955 (59 y.o. Female) Treating RN: Montey Hora Primary Care Physician: [name] Other Clinician: Referring Physician: referred, self Treating Physician/Extender: BURNS, Charlean Sanfilippo in Treatment: 49 Subjective Chief Complaint Information obtained from Patient Bilateral lower extremity phlebolymphedema and ulcerations since May 2014. New ulcers on R. All  healed on L. History of Present Illness (HPI) 59yo w/ h/o BLE phlebolymphedema, type 2 DM, and obesity. Has a h/o chronic, recurrent BLE calf ulcers. Tolerating 4 layer compression. Ulcers recently healed, but recurred. Infrequently uses lymphedema pump. Awaiting lymphedema clinic visit. In the past has developed recurrent ulcers when compression bandages discontinued. She returns to clinic today and complains of new R calf ulcers. Mild pain. No F/C. Moderate clear drainage. Objective Constitutional Pulse  regular. Respirations normal and unlabored. Afebrile. Vitals Time Taken: 8:39 AM, Height: 65 in, Weight: 322 lbs, BMI: 53.6, Temperature: 97.6 F, Pulse: 57 bpm, Respiratory Rate: 18 breaths/min, Blood Pressure: 207/70 mmHg. General Notes: New right lower extremity ulcers. No cellulitis. No left calf ulcerations. Palpable DP bilaterally. Integumentary (Hair, Skin) Wound #45 status is Open. Original cause of wound was Blister. The wound is located on the Right,Proximal,Lateral Lower Leg. The wound measures 0.5cm length x 0.8cm width x 0.1cm depth; 0.314cm^2 area and 0.031cm^3 volume. The wound is limited to skin breakdown. There is no tunneling or undermining noted. There is a small amount of serous drainage noted. The wound margin is distinct with the outline attached to the wound base. There is large (67-100%) red granulation within the wound bed. There is no necrotic tissue within the wound bed. The periwound skin appearance did not exhibit: Callus, Shahin, Paloma K. ([id number]) Crepitus, Excoriation, Fluctuance, Friable, Induration, Localized Edema, Rash, Scarring, Dry/Scaly, Maceration, Moist, Atrophie Blanche, Cyanosis, Ecchymosis, Hemosiderin Staining, Mottled, Pallor, Rubor, Erythema. Periwound temperature was noted as No Abnormality. Wound #46 status is Open. Original cause of wound was Blister. The wound is located on the Left,Lateral Lower Leg. The wound measures 0cm length x 0cm width x 0cm depth; 0cm^2 area and 0cm^3 volume. Wound #47 status is Open. Original cause of wound was Blister. The wound is located on the Right,Medial Lower Leg. The wound measures 1cm length x 0.8cm width x 0.1cm depth; 0.628cm^2 area and 0.063cm^3 volume. The wound is limited to skin breakdown. There is no tunneling or undermining noted. There is a medium amount of serous drainage noted. The wound margin is distinct with the outline attached to the wound base. There is large (67-100%) red granulation  within the wound bed. There is no necrotic tissue within the wound bed. The periwound skin appearance did not exhibit: Callus, Crepitus, Excoriation, Fluctuance, Friable, Induration, Localized Edema, Rash, Scarring, Dry/Scaly, Maceration, Moist, Atrophie Blanche, Cyanosis, Ecchymosis, Hemosiderin Staining, Mottled, Pallor, Rubor, Erythema. Periwound temperature was noted as No Abnormality. Wound #48 status is Open. Original cause of wound was Blister. The wound is located on the Right,Lateral Lower Leg. The wound measures 7.5cm length x 1.9cm width x 0.1cm depth; 11.192cm^2 area and 1.119cm^3 volume. The wound is limited to skin breakdown. There is no tunneling or undermining noted. There is a medium amount of serous drainage noted. The wound margin is distinct with the outline attached to the wound base. There is large (67-100%) red granulation within the wound bed. There is no necrotic tissue within the wound bed. The periwound skin appearance did not exhibit: Callus, Crepitus, Excoriation, Fluctuance, Friable, Induration, Localized Edema, Rash, Scarring, Dry/Scaly, Maceration, Moist, Atrophie Blanche, Cyanosis, Ecchymosis, Hemosiderin Staining, Mottled, Pallor, Rubor, Erythema. Periwound temperature was noted as No Abnormality. 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 ([id number]) bilateral lower extremity phlebolymphedema and new right calf ulcerations. Healed on the left. Procedures Wound #48 Wound #48 is a Venous Leg Ulcer located on the Right,Lateral Lower  Leg . There was a Skin/Subcutaneous Tissue Debridement [id number]) debridement with total area of 14.25 sq cm performed by BURNS, Teressa Senter., MD. with the following instrument(s): Curette to remove Non-Viable tissue/material including Exudate and Fibrin/Slough after achieving pain control using Lidocaine 4% Topical Solution. A time out was conducted prior to the start of the procedure. There was no bleeding. The procedure was tolerated well with a pain level of 0 throughout and a pain level of 0 following the procedure. Post Debridement Measurements: 7.5cm length x 1.9cm width x 0.2cm depth; 2.238cm^3 volume. Plan Wound Cleansing: Wound #45 Right,Proximal,Lateral Lower Leg: Cleanse wound with mild soap and water May shower with protection. Wound #47 Right,Medial Lower Leg: Cleanse wound with mild soap and water May shower with protection. Wound #48 Right,Lateral Lower Leg: Cleanse wound with mild soap and water May shower with protection. Skin Barriers/Peri-Wound Care: Wound #45 Right,Proximal,Lateral Lower Leg: Barrier cream Wound #47 Right,Medial Lower Leg: Barrier cream Wound #48 Right,Lateral Lower Leg: Barrier cream Primary Wound Dressing: Wound #45 Right,Proximal,Lateral Lower Leg: Iodoflex Wound #47 Right,Medial Lower Leg: Iodoflex Wound #48 Right,Lateral Lower Leg: Iodoflex Secondary Dressing: Wound #45 Right,Proximal,Lateral Lower Leg: Gauze, ABD and Kerlix/Conform Wound #47 Right,Medial Lower Leg: Gauze, ABD and Kerlix/Conform Wound #48 Right,Lateral Lower Leg: Perillo, Ileen K. ([id number]) Gauze, ABD and Kerlix/Conform Dressing Change Frequency: Wound #45 Right,Proximal,Lateral Lower Leg: Change dressing every week Wound #47 Right,Medial Lower Leg: Change dressing every week Wound #48 Right,Lateral Lower Leg: Change dressing every week Follow-up Appointments: Wound #45 Right,Proximal,Lateral Lower Leg: Return Appointment in 1 week. Wound #47  Right,Medial Lower Leg: Return Appointment in 1 week. Wound #48 Right,Lateral Lower Leg: Return Appointment in 1 week. Edema Control: Wound #45 Right,Proximal,Lateral Lower Leg: 4 Layer Compression System - Bilateral Wound #47 Right,Medial Lower Leg: 4 Layer Compression System - Bilateral Wound #48 Right,Lateral Lower Leg: 4 Layer Compression System - Bilateral General Notes: Patient given the phone number for Lymphedema Specialist to call her for custom compression fitting. cadexomer iodine. 4-layer compression. Lymphedema pump. Trying to arrange for her to get fitted for custom made compression stockings. Electronic Signature(s) Signed: 10/29/2014 4:29:30 PM By: Loletha Grayer MD Entered By: Loletha Grayer on 10/29/2014 13:42:09

## 2014-11-05 ENCOUNTER — Encounter: Payer: Managed Care, Other (non HMO) | Admitting: Surgery

## 2014-11-05 DIAGNOSIS — L97219 Non-pressure chronic ulcer of right calf with unspecified severity: Secondary | ICD-10-CM | POA: Diagnosis not present

## 2014-11-05 NOTE — Progress Notes (Addendum)
TAYLORANN, MCCUEN ([id number]) Visit Report for 11/05/2014 Chief Complaint Document Details Patient Name: [name], [name]. Date of Service: 11/05/2014 3:30 PM Medical Record Number: [id number] Patient Account Number: [id number] Date of Birth/Sex: 12-15-1955 (59 y.o. 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 New ulcers on R. All healed on L. New blister on L. Electronic Signature(s) Signed: 11/06/2014 5:17:27 AM By: Loletha Grayer MD Entered By: Loletha Grayer on 11/06/2014 08:08:03 [name] ([id number]) -------------------------------------------------------------------------------- Debridement Details Patient Name: [name]. Date of Service: 11/05/2014 3:30 PM Medical Record Number: [id number] Patient Account Number: [id number] Date of Birth/Sex: December 23, 1955 (59 y.o. Female) Treating RN: Cornell Barman Primary Care Physician: [name] Other Clinician: Referring Physician: [name] Treating Physician/Extender: Suella Grove in Treatment: 84 Debridement Performed for Wound #48 Right,Lateral Lower Leg Assessment: Performed By: Physician , Debridement: Debridement Pre-procedure Yes Verification/Time Out Taken: Start Time: 16:20 Pain Control: Lidocaine 4% Topical Solution Level: Skin/Subcutaneous Tissue Total Area Debrided (L x 1.2 (cm) x 8 (cm) = 9.6 (cm) W): Tissue and other Viable, Non-Viable, Exudate, Fat, Fibrin/Slough, Subcutaneous material debrided: Instrument: Curette Bleeding: Minimum Hemostasis Achieved: Pressure End Time: 16:21 Procedural Pain: 0 Post Procedural Pain: 0 Response to Treatment: Procedure was tolerated well Post Debridement Measurements of Total Wound Length: (cm)  1.2 Width: (cm) 8 Depth: (cm) 0.2 Volume: (cm) 1.508 Electronic Signature(s) Signed: 11/06/2014 5:17:27 AM By: Loletha Grayer MD Signed: 11/06/2014 5:14:54 PM By: Gretta Cool RN, BSN, Kim RN, BSN Previous Signature: 11/05/2014 5:48:59 PM Version By: Regan Lemming BSN, RN Entered By: Loletha Grayer on 11/06/2014 08:07:44 [name] ([id number]) -------------------------------------------------------------------------------- HPI Details Patient Name: [name]. Date of Service: 11/05/2014 3:30 PM Medical Record Number: [id number] Patient Account Number: [id number] Date of Birth/Sex: [birth year] (59 y.o. Female) Treating RN: Cornell Barman Primary Care Physician: [name] Other Clinician: Referring Physician: [name] Treating Physician/Extender: Suella Grove in Treatment: 36 History of Present Illness HPI Description: 59yo w/ h/o BLE phlebolymphedema, type 2 DM, and obesity. Has a h/o chronic, recurrent BLE calf ulcers. Tolerating 4 layer compression. Ulcers recently healed, but recurred. Infrequently uses lymphedema pump. Awaiting lymphedema clinic and/or private nurse visit to fit for custom compression stockings. In the past she has developed recurrent ulcers when compression bandages discontinued and juxtalite compression initiated. She returns to clinic today and complains of persistent right calf ulcers and a new left calf blister. Mild pain. No F/C. Moderate clear drainage. Electronic Signature(s) Signed: 11/06/2014 5:17:27 AM By: Loletha Grayer MD Entered By: Loletha Grayer on 11/06/2014 08:11:56 [name] ([id number]) -------------------------------------------------------------------------------- Physical Exam Details Patient Name: [name] Date of Service: 11/05/2014 3:30 PM Medical Record Number: [id number] Patient Account Number: [id number] Date of Birth/Sex: 02-16-1956 (59 y.o. Female) Treating RN: Cornell Barman Primary Care  Physician: [name] Other Clinician: Referring Physician: [name] Treating Physician/Extender: Suella Grove in Treatment: 67 Constitutional . Respiratory WNL. No retractions.. Cardiovascular Pedal Pulses WNL. Integumentary (Hair, Skin) .Marland Kitchen Neurological Sensation normal to touch, pin,and vibration. Psychiatric Judgement and insight Intact.. Oriented times 3.. No evidence of depression, anxiety, or agitation.. Notes Persistent right lower extremity ulcers. Full thickness. No cellulitis. No left calf ulcerations. Palpable DP bilaterally. New 3cm blister on L ant calf. Prepped with betadine and drained with 15 blade. Clear fluid evacuated. Skin viable. No blood loss. Pt tolerated well. Electronic  Signature(s) Signed: 11/06/2014 5:17:27 AM By: Loletha Grayer MD Entered By: Loletha Grayer on 11/06/2014 08:13:49 [name] ([id number]) -------------------------------------------------------------------------------- Physician Orders Details Patient Name: [name] Date of Service: 11/05/2014 3:30 PM Medical Record Number: [id number] Patient Account Number: [id number] Date of Birth/Sex: [birth year] (59 y.o. Female) Treating RN: Afful, RN, BSN, Velva Harman Primary Care Physician: [name] Other Clinician: Referring Physician: [name] Treating Physician/Extender: Suella Grove in Treatment: 58 Verbal / Phone Orders: Yes Clinician: Afful, RN, BSN, Rita Read Back and Verified: Yes Diagnosis Coding ICD-9 Coding Code Description 457.1 Other Lymphedema - excludes congenital, eyelid or vulva 459.33 Chronic venous hypertension with ulcer and inflammation 707.12 Ulcer of lower limbs, except pressure ulcer; Ulcer of calf ICD-10 Coding Code Description I89.0 Lymphedema, not elsewhere classified E66.9 Obesity, unspecified B35.3 Tinea pedis 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 Wound #48 Right,Lateral Lower Leg o Cleanse wound with mild soap and water o May shower with protection. Skin Barriers/Peri-Wound Care Wound #47 Right,Medial Lower Leg o Barrier cream Wound #48 Right,Lateral Lower Leg o Barrier cream Primary Wound Dressing Wound #47 Right,Medial Lower Leg o Iodoflex Moffit, Kayanna K. ([id number]) Wound #48 Right,Lateral Lower Leg o Iodoflex Secondary Dressing Wound #47 Right,Medial Lower Leg o Gauze, ABD and Kerlix/Conform Wound #48 Right,Lateral Lower Leg o Gauze, ABD and Kerlix/Conform Dressing Change Frequency Wound #47 Right,Medial Lower Leg o Change dressing every week Wound #48 Right,Lateral Lower Leg o Change dressing every week Follow-up Appointments Wound #47 Right,Medial Lower Leg o Return Appointment in 1 week. Wound #48 Right,Lateral Lower Leg o Return Appointment in 1 week. Edema Control Wound #47 Right,Medial Lower Leg o 4 Layer Compression System - Bilateral Wound #48 Right,Lateral Lower Leg o 4 Layer Compression System - Bilateral Electronic Signature(s) Signed: 11/05/2014 5:48:59 PM By: Regan Lemming BSN, RN Entered By: Regan Lemming on 11/05/2014 16:34:25 [name] ([id number]) -------------------------------------------------------------------------------- Problem List Details Patient Name: [name]. Date of Service: 11/05/2014 3:30 PM Medical Record Number: [id number] Patient Account Number: [id number] Date of Birth/Sex: [birth year] (59 y.o. Female) Treating RN: Cornell Barman Primary Care Physician: [name] Other Clinician: Referring Physician: [name] Treating Physician/Extender: Suella Grove in Treatment: 29 Active Problems ICD-9 Encounter Code Description Active Date Diagnosis 457.1  Other Lymphedema - excludes congenital, eyelid or vulva 03/27/2013 Yes 459.33 Chronic venous hypertension with ulcer and inflammation 03/27/2013 Yes 707.12 Ulcer of lower limbs, except pressure ulcer; Ulcer of calf 03/27/2013 Yes ICD-10 Encounter Code Description Active Date Diagnosis I89.0 Lymphedema, not elsewhere classified 03/26/2014 Yes E66.9 Obesity, unspecified 03/26/2014 Yes B35.3 Tinea pedis 08/01/2014 Yes 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 ([id number]) Resolved Problems ICD-10 Code Description Active Date Resolved Date I83.022 Varicose veins of left lower extremity with ulcer of calf 03/26/2014 S80.821A Blister (nonthermal), right lower leg, initial encounter 10/16/2014 10/16/2014 L03.115 Cellulitis of right lower limb 10/22/2014 10/22/2014 Electronic Signature(s) Signed: 11/06/2014 5:17:27 AM By: Loletha Grayer MD Previous Signature: 11/05/2014 4:25:44 PM Version By: Loletha Grayer MD Entered By: Loletha Grayer on 11/06/2014 08:07:08 [name] ([id number]) -------------------------------------------------------------------------------- Progress Note Details Patient Name: [name],  [name] Date of Service: 11/05/2014 3:30 PM Medical Record Number: [id number] Patient Account Number: [id number] Date of Birth/Sex: August 29, 1955 (59 y.o. Female) Treating RN: Cornell Barman Primary Care Physician: [name] Other Clinician: Referring Physician: [name] Treating Physician/Extender: Suella Grove in Treatment: 57 Subjective Chief Complaint Information obtained from Patient Bilateral lower extremity phlebolymphedema and ulcerations since May 2014. New ulcers on R. All healed on L. New blister on L. History of Present Illness (HPI) 59yo w/ h/o BLE  phlebolymphedema, type 2 DM, and obesity. Has a h/o chronic, recurrent BLE calf ulcers. Tolerating 4 layer compression. Ulcers recently healed, but recurred. Infrequently uses lymphedema pump. Awaiting lymphedema clinic and/or private nurse visit to fit for custom compression stockings. In the past she has developed recurrent ulcers when compression bandages discontinued and juxtalite compression initiated. She returns to clinic today and complains of persistent right calf ulcers and a new left calf blister. Mild pain. No F/C. Moderate clear drainage. Objective Constitutional Respiratory WNL. No retractions.. Cardiovascular Pedal Pulses WNL. Neurological Sensation normal to touch, pin,and vibration. Psychiatric Judgement and insight Intact.. Oriented times 3.. No evidence of depression, anxiety, or agitation.[name] ([id number]) General Notes: Persistent right lower extremity ulcers. Full thickness. No cellulitis. No left calf ulcerations. Palpable DP bilaterally. New 3cm blister on L ant calf. Prepped with betadine and drained with 15 blade. Clear fluid evacuated. Skin viable. No blood loss. Pt tolerated well. Integumentary (Hair, Skin) Wound #45 status is Open. Original cause of wound was Blister. The wound is located on the Right,Proximal,Lateral Lower Leg. The wound measures 0cm length x 0cm width x 0cm depth; 0cm^2 area and 0cm^3 volume. The wound is limited to skin breakdown. There is a small amount of serous drainage noted. The wound margin is distinct with the outline attached to the wound base. There is large (67-100%) red granulation within the wound bed. There is no necrotic tissue within the wound bed. The periwound skin appearance did not exhibit: Callus, Crepitus, Excoriation, Fluctuance, Friable, Induration, Localized Edema, Rash, Scarring, Dry/Scaly, Maceration, Moist, Atrophie Blanche, Cyanosis, Ecchymosis, Hemosiderin Staining, Mottled, Pallor, Rubor,  Erythema. Periwound temperature was noted as No Abnormality. Wound #47 status is Healed - Epithelialized. Original cause of wound was Blister. The wound is located on the Right,Medial Lower Leg. The wound measures 0cm length x 0cm width x 0cm depth; 0cm^2 area and 0cm^3 volume. The wound is limited to skin breakdown. There is a medium amount of serous drainage noted. The wound margin is distinct with the outline attached to the wound base. There is large (67-100%) red granulation within the wound bed. There is no necrotic tissue within the wound bed. The periwound skin appearance did not exhibit: Callus, Crepitus, Excoriation, Fluctuance, Friable, Induration, Localized Edema, Rash, Scarring, Dry/Scaly, Maceration, Moist, Atrophie Blanche, Cyanosis, Ecchymosis, Hemosiderin Staining, Mottled, Pallor, Rubor, Erythema. Periwound temperature was noted as No Abnormality. Wound #48 status is Open. Original cause of wound was Blister. The wound is located on the Right,Lateral Lower Leg. The wound measures 1.2cm length x 8cm width x 0.1cm depth; 7.54cm^2 area and 0.754cm^3 volume. The wound is limited to skin breakdown. There is a medium amount of serous drainage noted. The wound margin is distinct with the outline attached to the wound base. There is large (67-100%) red granulation within the wound bed. There is no necrotic tissue within the wound bed. The periwound skin appearance did not exhibit: Callus, Crepitus, Excoriation, Fluctuance, Friable, Induration, Localized Edema, Rash, Scarring, Dry/Scaly, Maceration, Moist, Atrophie Blanche, Cyanosis, Ecchymosis, Hemosiderin  Staining, Mottled, Pallor, Rubor, Erythema. Periwound temperature was noted as No Abnormality. Wound #49 status is Open. Original cause of wound was Blister. The wound is located on the Left,Medial Lower Leg. The wound measures 2.9cm length x 3.2cm width x 0.1cm depth; 7.288cm^2 area and 0.729cm^3 volume. The wound is limited to skin  breakdown. The wound margin is flat and intact. There is large (67-100%) granulation within the wound bed. There is no necrotic tissue within the wound bed. The periwound skin appearance did not exhibit: Callus, Crepitus, Excoriation, Fluctuance, Friable, Induration, Localized Edema, Rash, Scarring, Dry/Scaly, Maceration, Moist, Atrophie Blanche, Cyanosis, Ecchymosis, Hemosiderin Staining, Mottled, Pallor, Rubor, Erythema. 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 R calf ulcers. New L calf blister. Procedures Wound #48 Wound #48 is a Venous Leg Ulcer located on the Right,Lateral Lower Leg . There was a Skin/Subcutaneous Tissue Debridement [id number]) debridement with total area of 9.6 sq cm performed by . with the following instrument(s): Curette to remove Viable and Non-Viable tissue/material including Exudate, Fat, Fibrin/Slough, and Subcutaneous after achieving pain control using Lidocaine 4% Topical Solution. A time out was conducted prior to the start of the procedure. A Minimum amount of bleeding was controlled with Pressure. The procedure was tolerated well with a pain level of 0 throughout and a pain level of 0 following the procedure. Post Debridement Measurements: 1.2cm length x 8cm width x 0.2cm depth; 1.508cm^3 volume. Plan Wound Cleansing: Wound #47 Right,Medial Lower Leg: Cleanse wound with mild soap and water May shower with protection. Wound  #48 Right,Lateral Lower Leg: Cleanse wound with mild soap and water May shower with protection. Skin Barriers/Peri-Wound Care: [name], [name] ([id number]) Wound #47 Right,Medial Lower Leg: Barrier cream Wound #48 Right,Lateral Lower Leg: Barrier cream Primary Wound Dressing: Wound #47 Right,Medial Lower Leg: Iodoflex Wound #48 Right,Lateral Lower Leg: Iodoflex Secondary Dressing: Wound #47 Right,Medial Lower Leg: Gauze, ABD and Kerlix/Conform Wound #48 Right,Lateral Lower Leg: Gauze, ABD and Kerlix/Conform Dressing Change Frequency: Wound #47 Right,Medial Lower Leg: Change dressing every week Wound #48 Right,Lateral Lower Leg: Change dressing every week Follow-up Appointments: Wound #47 Right,Medial Lower Leg: Return Appointment in 1 week. Wound #48 Right,Lateral Lower Leg: Return Appointment in 1 week. Edema Control: Wound #47 Right,Medial Lower Leg: 4 Layer Compression System - Bilateral Wound #48 Right,Lateral Lower Leg: 4 Layer Compression System - Bilateral Cadexomer iodine. 4 layer compression. Schedule appointment for fitting of compression stockings. Electronic Signature(s) Signed: 11/06/2014 5:17:27 AM By: Loletha Grayer MD Entered By: Loletha Grayer on 11/06/2014 08:15:14 [name] ([id number]) -------------------------------------------------------------------------------- SuperBill Details Patient Name: [name] Date of Service: 11/05/2014 Medical Record Number: [id number] Patient Account Number: [id number] Date of Birth/Sex: [birth year] (59 y.o. 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 ([id number]) Modifier: Quantity: 1 Electronic Signature(s) Signed: 11/06/2014 5:17:27 AM By: Loletha Grayer MD Previous Signature: 11/05/2014 5:59:15 PM Version By: Gretta Cool RN, BSN, Kim RN, BSN Entered By: Loletha Grayer on 11/06/2014 08:16:30

## 2014-11-06 NOTE — Progress Notes (Signed)
[name], [name] ([id number]) Visit Report for 11/05/2014 Arrival Information Details Patient Name: [name], [name]. Date of Service: 11/05/2014 3:30 PM Medical Record Number: [id number] Patient Account Number: [id number] Date of Birth/Sex: 09/27/1955 (59 y.o. Female) Treating RN: Cornell Barman Primary Care Physician: [name] Other Clinician: Referring Physician: [name] Treating Physician/Extender: Suella Grove in Treatment: 20 Visit Information History Since Last Visit Added or deleted any medications: Yes Patient Arrived: Cane Any new allergies or adverse reactions: No Arrival Time: 16:04 Had a fall or experienced change in No Accompanied By: self activities of daily living that may affect Transfer Assistance: None risk of falls: Patient Identification Verified: Yes Signs or symptoms of abuse/neglect since last No Secondary Verification Process Yes visito Completed: Hospitalized since last visit: No Patient Requires Transmission- No Has Dressing in Place as Prescribed: Yes Based Precautions: Has Compression in Place as Prescribed: Yes Patient Has Alerts: Yes Pain Present Now: No Patient Alerts: Patient on Blood Thinner Electronic Signature(s) Signed: 11/05/2014 5:59:15 PM By: Gretta Cool, RN, BSN, Kim RN, BSN Entered By: Gretta Cool, RN, BSN, Kim on 11/05/2014 16:05:10 [name] ([id number]) -------------------------------------------------------------------------------- Encounter Discharge Information Details Patient Name: [name]. Date of Service: 11/05/2014 3:30 PM Medical Record Number: [id number] Patient Account Number: [id number] Date of Birth/Sex: [birth year] (59 y.o. Female) Treating RN: Cornell Barman Primary Care Physician: [name] Other Clinician: Referring Physician: [name] Treating Physician/Extender: Suella Grove in Treatment: 54 Encounter Discharge Information Items Schedule Follow-up Appointment: No Medication Reconciliation  completed No and provided to Patient/Care Abhay Godbolt: Provided on Clinical Summary of Care: 11/05/2014 Form Type Recipient Paper Patient GI Electronic Signature(s) Signed: 11/05/2014 4:51:52 PM By: Ruthine Dose Entered By: Ruthine Dose on 11/05/2014 16:51:51 [name] ([id number]) -------------------------------------------------------------------------------- Lower Extremity Assessment Details Patient Name: [name]. Date of Service: 11/05/2014 3:30 PM Medical Record Number: [id number] Patient Account Number: [id number] Date of Birth/Sex: December 22, 1955 (59 y.o. Female) Treating RN: Cornell Barman Primary Care Physician: [name] Other Clinician: Referring Physician: [name] Treating Physician/Extender: Suella Grove in Treatment: 84 Edema Assessment Assessed: [Left: No] [Right: No] E[Left: dema] [Right: :] Calf Left: Right: Point of Measurement: 24 cm From Medial Instep 44.5 cm 39.6 cm Ankle Left: Right: Point of Measurement: 8 cm From Medial Instep 23 cm 23.2 cm Vascular Assessment Pulses: Posterior Tibial Dorsalis Pedis Palpable: [Left:Yes] [Right:Yes] Extremity colors, hair growth, and conditions: Extremity Color: [Left:Mottled] [Right:Mottled] Hair Growth on Extremity: [Left:No] [Right:No] Temperature of Extremity: [Left:Cool] [Right:Cool] Capillary Refill: [Left:< 3 seconds] [Right:< 3 seconds] Toe Nail Assessment Left: Right: Thick: Yes Yes Discolored: Yes Yes Deformed: Yes Yes Improper Length and Hygiene: Yes Yes Electronic Signature(s) Signed: 11/05/2014 5:59:15 PM By: Gretta Cool, RN, BSN, Kim RN, BSN Entered By: Gretta Cool, RN, BSN, Kim on 11/05/2014 16:25:58 [name] ([id number]) -------------------------------------------------------------------------------- Multi Wound Chart Details Patient Name: [name]. Date of Service: 11/05/2014 3:30 PM Medical Record Number: [id number] Patient Account Number: [id number] Date of Birth/Sex:  Mar 25, 1956 (58 y.o. Female) Treating RN: Baruch Gouty, RN, BSN, Velva Harman Primary Care Physician: [name] Other Clinician: Referring Physician: [name] Treating Physician/Extender: Suella Grove in Treatment: 84 Wound Assessments Treatment Notes Electronic Signature(s) Signed: 11/05/2014 5:48:59 PM By: Regan Lemming BSN, RN Entered By: Regan Lemming on 11/05/2014 16:30:51 [name] ([id number]) -------------------------------------------------------------------------------- [place] Details Patient Name: [name] Date of Service: 11/05/2014 3:30 PM Medical Record Number: [id number] Patient Account Number: [id number] Date of Birth/Sex: 1956-05-21 (59 y.o. Female) Treating RN: Baruch Gouty, RN, BSN, Velva Harman Primary Care Physician: [name] Other Clinician: Referring Physician: [name]  Treating Physician/Extender: Weeks in Treatment: 74 Active Inactive Nutrition Nursing Diagnoses: Impaired glucose control: actual or potential Goals: Patient/caregiver verbalizes understanding of need to maintain therapeutic glucose control per primary care physician Date Initiated: 12/16/2013 Goal Status: Active Interventions: Provide education on elevated blood sugars and impact on wound healing Notes: Soft Tissue Infection Nursing Diagnoses: Impaired tissue integrity Goals: Patient will remain free of wound infection Date Initiated: 12/16/2013 Goal Status: Active Interventions: Assess signs and symptoms of infection every visit Notes: Venous Leg Ulcer Nursing Diagnoses: Knowledge deficit related to disease process and management Potential for venous Insuffiency (use before diagnosis confirmed) GoalsKADEIDRE, [name] ([id number]) Patient will maintain optimal edema control Date Initiated: 08/18/2014 Goal Status: Active Interventions: Assess peripheral edema status every visit. Compression as ordered Treatment Activities: Therapeutic compression applied :  11/05/2014 Notes: Electronic Signature(s) Signed: 11/05/2014 5:48:59 PM By: Regan Lemming BSN, RN Entered By: Regan Lemming on 11/05/2014 16:30:42 [name] ([id number]) -------------------------------------------------------------------------------- Pain Assessment Details Patient Name: [name]. Date of Service: 11/05/2014 3:30 PM Medical Record Number: [id number] Patient Account Number: [id number] Date of Birth/Sex: [birth year] (59 y.o. Female) Treating RN: Cornell Barman Primary Care Physician: [name] Other Clinician: Referring Physician: [name] Treating Physician/Extender: Suella Grove in Treatment: 85 Active Problems Location of Pain Severity and Description of Pain Patient Has Paino No Site Locations Pain Management and Medication Current Pain Management: Electronic Signature(s) Signed: 11/05/2014 5:59:15 PM By: Gretta Cool, RN, BSN, Kim RN, BSN Entered By: Gretta Cool, RN, BSN, Kim on 11/05/2014 16:05:17 [name] ([id number]) -------------------------------------------------------------------------------- Patient/Caregiver Education Details Patient Name: [name] Date of Service: 11/05/2014 3:30 PM Medical Record Number: [id number] Patient Account Number: [id number] Date of Birth/Gender: 10-Jun-1956 (59 y.o. Female) Treating RN: Montey Hora Primary Care Physician: [name] Other Clinician: Referring Physician: [name] Treating Physician/Extender: Suella Grove in Treatment: 22 Education Assessment Education Provided To: Patient Education Topics Provided Wound/Skin Impairment: Handouts: Other: come in for nurse visit if wrap slips or becomes too tight Methods: Demonstration, Explain/Verbal Responses: State content correctly Electronic Signature(s) Signed: 11/05/2014 5:02:24 PM By: Montey Hora Entered By: Montey Hora on 11/05/2014 17:02:24 [name]  ([id number]) -------------------------------------------------------------------------------- Wound Assessment Details Patient Name: [name]. Date of Service: 11/05/2014 3:30 PM Medical Record Number: [id number] Patient Account Number: [id number] Date of Birth/Sex: 08/09/1955 (59 y.o. Female) Treating RN: Cornell Barman Primary Care Physician: [name] Other Clinician: Referring Physician: [name] Treating Physician/Extender: Suella Grove in Treatment: 84 Wound Status Wound Number: 45 Primary Venous Leg Ulcer Etiology: Wound Location: Right Lower Leg - Lateral, Proximal Wound Status: Open Wounding Event: Blister Comorbid Lymphedema, Hypertension, Type II History: Diabetes Date Acquired: 10/15/2014 Weeks Of Treatment: 3 Clustered Wound: No Photos Photo Uploaded By: Gretta Cool, RN, BSN, Kim on 11/05/2014 17:10:01 Wound Measurements Length: (cm) 0 % Redu Width: (cm) 0 % Redu Depth: (cm) 0 Epithe Area: (cm) 0 Volume: (cm) 0 ction in Area: 100% ction in Volume: 100% lialization: Medium (34-66%) Wound Description 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 ([id number]) Necrotic Amount: None Present (0%) Fat Layer Exposed: No Tendon Exposed: No Muscle Exposed: No Joint Exposed: No Bone Exposed: No Limited to Skin Breakdown Periwound Skin Texture Texture Color No Abnormalities Noted: No No Abnormalities Noted: No Callus: No Atrophie Blanche: No Crepitus: No Cyanosis: No Excoriation: No Ecchymosis:  No Fluctuance: No Erythema: No Friable: No Hemosiderin Staining: No Induration: No Mottled: No Localized Edema: No Pallor: No Rash: No Rubor: No Scarring: No Temperature  / Pain Moisture Temperature: No Abnormality No Abnormalities Noted: No Dry / Scaly: No Maceration: No Moist: No Wound Preparation Ulcer Cleansing: Other: soap and water , Electronic Signature(s) Signed: 11/05/2014 5:59:15 PM By: Gretta Cool, RN, BSN, Kim RN, BSN Entered By: Gretta Cool, RN, BSN, Kim on 11/05/2014 16:33:37 [name] ([id number]) -------------------------------------------------------------------------------- Wound Assessment Details Patient Name: [name]. Date of Service: 11/05/2014 3:30 PM Medical Record Number: [id number] Patient Account Number: [id number] Date of Birth/Sex: 06-Sep-1955 (59 y.o. Female) Treating RN: Cornell Barman Primary Care Physician: [name] Other Clinician: Referring Physician: [name] Treating Physician/Extender: Suella Grove in Treatment: 84 Wound Status Wound Number: 47 Primary Lymphedema Etiology: Wound Location: Right Lower Leg - Medial Wound Status: Healed - Epithelialized Wounding Event: Blister Comorbid Lymphedema, Hypertension, Type II Date Acquired: 10/22/2014 History: Diabetes Weeks Of Treatment: 2 Clustered Wound: No Photos Photo Uploaded By: Gretta Cool, RN, BSN, Kim on 11/05/2014 17:13:38 Wound Measurements Length: (cm) 0 % Redu Width: (cm) 0 % Redu Depth: (cm) 0 Epithe Area: (cm) 0 Volume: (cm) 0 ction in Area: 100% ction in Volume: 100% lialization: Small (1-33%) Wound Description Classification: Partial Thickness Diabetic Severity (Wagner): Grade 1 Wound Margin: Distinct, outline attach Exudate Amount: Medium Exudate Type: Serous Exudate Color: amber Foul Odor After Cleansing: No ed Wound Bed Granulation Amount: Large (67-100%) Exposed Structure Granulation Quality: Red Fascia Exposed: No Necrotic Amount: None Present (0%) Fat Layer Exposed: No [name], [name] ([id number]) Tendon Exposed: No Muscle Exposed: No Joint Exposed: No Bone Exposed: No Limited to Skin Breakdown Periwound Skin  Texture Texture Color No Abnormalities Noted: No No Abnormalities Noted: No Callus: No Atrophie Blanche: No Crepitus: No Cyanosis: No Excoriation: No Ecchymosis: No Fluctuance: No Erythema: No Friable: No Hemosiderin Staining: No Induration: No Mottled: No Localized Edema: No Pallor: No Rash: No Rubor: No Scarring: No Temperature / Pain Moisture Temperature: No Abnormality No Abnormalities Noted: No Dry / Scaly: No Maceration: No Moist: No Wound Preparation Ulcer Cleansing: Other: soap and water, Electronic Signature(s) Signed: 11/05/2014 5:59:15 PM By: Gretta Cool, RN, BSN, Kim RN, BSN Entered By: Gretta Cool, RN, BSN, Kim on 11/05/2014 16:35:57 [name] ([id number]) -------------------------------------------------------------------------------- Wound Assessment Details Patient Name: [name]. Date of Service: 11/05/2014 3:30 PM Medical Record Number: [id number] Patient Account Number: [id number] Date of Birth/Sex: 1955-11-10 (59 y.o. Female) Treating RN: Cornell Barman Primary Care Physician: [name] Other Clinician: Referring Physician: [name] Treating Physician/Extender: Suella Grove in Treatment: 84 Wound Status Wound Number: 48 Primary Venous Leg Ulcer Etiology: Wound Location: Right Lower Leg - Lateral Wound Status: Open Wounding Event: Blister Comorbid Lymphedema, Hypertension, Type II Date Acquired: 10/29/2014 History: Diabetes Weeks Of Treatment: 1 Clustered Wound: No Photos Photo Uploaded By: Gretta Cool, RN, BSN, Kim on 11/05/2014 17:13:38 Wound Measurements Length: (cm) 1.2 Width: (cm) 8 Depth: (cm) 0.1 Area: (cm) 7.54 Volume: (cm) 0.754 % Reduction in Area: 32.6% % Reduction in Volume: 32.6% Epithelialization: None Wound Description Classification: Partial Thickness Foul O Diabetic Severity (Wagner): Grade 1 Wound Margin: Distinct, outline attached Exudate Amount: Medium Exudate Type: Serous Exudate Color: amber dor After  Cleansing: No Wound Bed Granulation Amount: Large (67-100%) Exposed Structure Granulation Quality: Red Fascia Exposed: No Necrotic Amount: None Present (0%) Fat Layer Exposed: No [name], [name] ([id number]) Tendon Exposed: No Muscle Exposed: No Joint Exposed: No Bone Exposed: No Limited to Skin Breakdown Periwound Skin Texture  Texture Color No Abnormalities Noted: No No Abnormalities Noted: No Callus: No Atrophie Blanche: No Crepitus: No Cyanosis: No Excoriation: No Ecchymosis: No Fluctuance: No Erythema: No Friable: No Hemosiderin Staining: No Induration: No Mottled: No Localized Edema: No Pallor: No Rash: No Rubor: No Scarring: No Temperature / Pain Moisture Temperature: No Abnormality No Abnormalities Noted: No Dry / Scaly: No Maceration: No Moist: No Wound Preparation Ulcer Cleansing: Other: soap and water, Topical Anesthetic Applied: None Treatment Notes Wound #48 (Right, Lateral Lower Leg) 1. Cleansed with: Cleanse wound with antibacterial soap and water 4. Dressing Applied: Iodoflex 5. Secondary Dressing Applied ABD and Kerlix/Conform 7. Secured with 4 Layer Compression System - Bilateral Electronic Signature(s) Signed: 11/05/2014 5:59:15 PM By: Gretta Cool, RN, BSN, Kim RN, BSN Entered By: Gretta Cool, RN, BSN, Kim on 11/05/2014 16:36:21 [name] ([id number]) -------------------------------------------------------------------------------- Wound Assessment Details Patient Name: [name] Date of Service: 11/05/2014 3:30 PM Medical Record Number: [id number] Patient Account Number: [id number] Date of Birth/Sex: [birth year] (60 y.o. Female) Treating RN: Cornell Barman Primary Care Physician: [name] Other Clinician: Referring Physician: [name] Treating Physician/Extender: Suella Grove in Treatment: 84 Wound Status Wound Number: 49 Primary Venous Leg Ulcer Etiology: Wound Location: Left Lower Leg - Medial Wound Status: Open Wounding  Event: Blister Comorbid Lymphedema, Hypertension, Type II Date Acquired: 11/03/2014 History: Diabetes Weeks Of Treatment: 0 Clustered Wound: No Photos Photo Uploaded By: Gretta Cool, RN, BSN, Kim on 11/05/2014 17:14:31 Wound Measurements Length: (cm) 2.9 Width: (cm) 3.2 Depth: (cm) 0.1 Area: (cm) 7.288 Volume: (cm) 0.729 % Reduction in Area: 0% % Reduction in Volume: 0% Wound Description Classification: Partial Thickness Diabetic Severity Autumn [place]): Grade 0 Wound Margin: Flat and Intact Wound Bed Granulation Amount: Large (67-100%) Exposed Structure Necrotic Amount: None Present (0%) Fascia Exposed: No Fat Layer Exposed: No Tendon Exposed: No Muscle Exposed: No Joint Exposed: No [name], [name] ([id number]) Bone Exposed: No Limited to Skin Breakdown Periwound Skin Texture Texture Color No Abnormalities Noted: No No Abnormalities Noted: No Callus: No Atrophie Blanche: No Crepitus: No Cyanosis: No Excoriation: No Ecchymosis: No Fluctuance: No Erythema: No Friable: No Hemosiderin Staining: No Induration: No Mottled: No Localized Edema: No Pallor: No Rash: No Rubor: No Scarring: No Moisture No Abnormalities Noted: No Dry / Scaly: No Maceration: No Moist: No Wound Preparation Topical Anesthetic Applied: Other Treatment Notes Wound #49 (Left, Medial Lower Leg) 1. Cleansed with: Cleanse wound with antibacterial soap and water 4. Dressing Applied: Iodoflex 5. Secondary Dressing Applied ABD and Kerlix/Conform 7. Secured with 4 Layer Compression System - Bilateral Electronic Signature(s) Signed: 11/05/2014 5:59:15 PM By: Gretta Cool, RN, BSN, Kim RN, BSN Entered By: Gretta Cool, RN, BSN, Kim on 11/05/2014 16:35:00 [name] ([id number]) -------------------------------------------------------------------------------- Vitals Details Patient Name: [name] Date of Service: 11/05/2014 3:30 PM Medical Record Number: [id number] Patient Account Number:  [id number] Date of Birth/Sex: [birth year] (59 y.o. Female) Treating RN: Cornell Barman Primary Care Physician: [name] Other Clinician: Referring Physician: [name] Treating Physician/Extender: Suella Grove in Treatment: 14 Vital Signs Reference Range: 80 - 120 mg / dl Electronic Signature(s) Signed: 11/05/2014 5:59:15 PM By: Gretta Cool, RN, BSN, Kim RN, BSN Entered By: Gretta Cool, RN, BSN, Kim on 11/05/2014 16:05:18

## 2014-11-12 ENCOUNTER — Encounter: Payer: Managed Care, Other (non HMO) | Admitting: Surgery

## 2014-11-12 DIAGNOSIS — L97219 Non-pressure chronic ulcer of right calf with unspecified severity: Secondary | ICD-10-CM | POA: Diagnosis not present

## 2014-11-12 NOTE — Progress Notes (Addendum)
[name], [name] ([id number]) Visit Report for 11/12/2014 Chief Complaint Document Details Patient Name: [name], [name]. Date of Service: 11/12/2014 8:15 AM Medical Record Number: [id number] Patient Account Number: [id number] Date of Birth/Sex: 1955/11/18 (59 y.o. Female) Treating RN: Primary Care Physician: [name] Other Clinician: Referring Physician: [name] Treating Physician/Extender: BURNS, Charlean Sanfilippo in Treatment: 33 Information Obtained from: Patient Chief Complaint Bilateral lower extremity phlebolymphedema and ulcerations since May 2014. New painful L great toe callus. Electronic Signature(s) Signed: 11/12/2014 3:18:58 PM By: Loletha Grayer MD Entered By: Loletha Grayer on 11/12/2014 14:15:02 [name] ([id number]) -------------------------------------------------------------------------------- HPI Details Patient Name: [name] Date of Service: 11/12/2014 8:15 AM Medical Record Number: [id number] Patient Account Number: [id number] Date of Birth/Sex: 11-Mar-1956 (59 y.o. Female) Treating RN: Primary Care Physician: [name] Other Clinician: Referring Physician: [name] Treating Physician/Extender: BURNS, Charlean Sanfilippo in Treatment: 38 History of Present Illness HPI Description: 59yo w/ h/o BLE phlebolymphedema, type 2 DM, and obesity. Has a h/o chronic, recurrent BLE calf ulcers. Tolerating 4 layer compression. Ulcers recently healed, but recurred. Infrequently uses lymphedema pump. Awaiting lymphedema clinic and/or private nurse visit to fit for custom compression stockings. In the past she has developed recurrent ulcers when compression bandages discontinued and juxtalite compression initiated. She returns to clinic today and complains of a painful callus on L great toe. Mild pain. No F/C. Moderate clear drainage from calf ulcers. Electronic Signature(s) Signed: 11/12/2014 3:18:58 PM By: Loletha Grayer  MD Entered By: Loletha Grayer on 11/12/2014 14:15:55 [name] ([id number]) -------------------------------------------------------------------------------- Physical Exam Details Patient Name: [name] Date of Service: 11/12/2014 8:15 AM Medical Record Number: [id number] Patient Account Number: [id number] Date of Birth/Sex: 29-Jan-1956 (59 y.o. Female) Treating RN: Primary Care Physician: [name] Other Clinician: Referring Physician: [name] Treating Physician/Extender: BURNS, Charlean Sanfilippo in Treatment: 85 Constitutional . Pulse regular. Respirations normal and unlabored. Afebrile. . Notes Small bilateral lower extremity ulcers. Full thickness. No cellulitis. Palpable DP bilaterally. New raised callus on L great toe. Sharply debrided. No underlying ulceration. No blood loss. Patient tolerated well. Electronic Signature(s) Signed: 11/12/2014 3:18:58 PM By: Loletha Grayer MD Entered By: Loletha Grayer on 11/12/2014 14:17:24 [name] ([id number]) -------------------------------------------------------------------------------- Physician Orders Details Patient Name: [name] Date of Service: 11/12/2014 8:15 AM Medical Record Number: [id number] Patient Account Number: [id number] Date of Birth/Sex: Feb 05, 1956 (59 y.o. Female) Treating RN: Baruch Gouty, RN, BSN, Velva Harman Primary Care Physician: [name] Other Clinician: Referring Physician: [name] Treating Physician/Extender: Suella Grove in Treatment: 45 Verbal / Phone Orders: Yes Clinician: Afful, RN, BSN, Rita Read Back and Verified: Yes Diagnosis Coding Wound Cleansing Wound #48 Right,Lateral Lower Leg o Cleanse wound with mild soap and water o May shower with protection. Skin Barriers/Peri-Wound Care Wound #48 Right,Lateral Lower Leg o Barrier cream Primary Wound Dressing Wound #48 Right,Lateral Lower Leg o Iodoflex Secondary Dressing Wound #48 Right,Lateral  Lower Leg o Gauze, ABD and Kerlix/Conform Dressing Change Frequency Wound #48 Right,Lateral Lower Leg o Change dressing every week Follow-up Appointments Wound #48 Right,Lateral Lower Leg o Return Appointment in 1 week. Edema Control Wound #48 Right,Lateral Lower Leg o 4 Layer Compression System - Bilateral Notes Patient will get fitted next visit for custom compression Electronic Signature(s) Signed: 11/12/2014 9:01:02 AM By: Regan Lemming BSN, RN [name] ([id number]) Entered By: Regan Lemming on 11/12/2014 09:01:02 [name] ([id number]) -------------------------------------------------------------------------------- Problem List Details Patient Name: [name]. Date of Service: 11/12/2014 8:15 AM Medical Record  Number: [id number] Patient Account Number: [id number] Date of Birth/Sex: [birth year] (59 y.o. Female) Treating RN: Primary Care Physician: [name] Other Clinician: Referring Physician: [name] Treating Physician/Extender: BURNS, Charlean Sanfilippo in Treatment: 78 Active Problems ICD-9 Encounter Code Description Active Date Diagnosis 457.1 Other Lymphedema - excludes congenital, eyelid or vulva 03/27/2013 Yes 459.33 Chronic venous hypertension with ulcer and inflammation 03/27/2013 Yes 707.12 Ulcer of lower limbs, except pressure ulcer; Ulcer of calf 03/27/2013 Yes ICD-10 Encounter Code Description Active Date Diagnosis I89.0 Lymphedema, not elsewhere classified 03/26/2014 Yes E66.9 Obesity, unspecified 03/26/2014 Yes B35.3 Tinea pedis 08/01/2014 Yes 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 Date of Service: 11/12/2014 8:15 AM Medical Record Number: [id number] Patient Account Number: [id number] Date of Birth/Sex: Jul 05, 1955 (59 y.o. Female) Treating RN: Primary Care Physician: [name] Other Clinician: Referring Physician: [name] Treating Physician/Extender: BURNS, Charlean Sanfilippo in Treatment: 35 Subjective Chief Complaint Information obtained from Patient Bilateral lower extremity phlebolymphedema and ulcerations since May 2014. New painful L great toe callus. History of Present Illness (HPI) 59yo w/ h/o BLE phlebolymphedema, type 2 DM, and obesity. Has a h/o chronic, recurrent BLE calf ulcers. Tolerating 4 layer compression. Ulcers recently healed, but recurred. Infrequently uses lymphedema pump. Awaiting lymphedema clinic and/or private nurse visit to fit for custom compression stockings. In the past she has developed recurrent ulcers when compression bandages discontinued and juxtalite compression initiated. She returns to clinic today and complains of a painful callus on L great toe. Mild pain. No F/C. Moderate clear drainage from calf ulcers. Objective Constitutional Pulse regular. Respirations normal and unlabored. Afebrile. Vitals Time Taken: 8:30 AM, Temperature: 98.0 F, Pulse: 64 bpm, Respiratory Rate: 20 breaths/min, Blood Pressure: 214/72 mmHg. General Notes: CM notified of elevated BP and pt asymptomatic. Pt took her BP medication around 7-7:30 am this morning. Pt is instructed to f/u with her PCP. General Notes: Small bilateral lower extremity ulcers. Full thickness. No cellulitis. Palpable DP bilaterally. New raised callus on L great toe. Sharply debrided. No underlying  ulceration. No blood loss. Patient tolerated well. Integumentary (Hair, Skin) Wound #48 status is Open. Original cause of wound was Blister. The wound is located on the Right,Lateral [place], [name] ([id number]) Lower Leg. The wound measures 0.4cm length x 0.9cm width x 0.1cm depth; 0.283cm^2 area and 0.028cm^3 volume. The wound is limited to skin breakdown. There is no tunneling or undermining noted. There is a medium amount of sanguinous drainage noted. The wound margin is distinct with the outline attached to the wound base. There is medium (34-66%) red granulation within the wound bed. There is no necrotic tissue within the wound bed. The periwound skin appearance exhibited: Localized Edema, Moist, Hemosiderin Staining. The periwound skin appearance did not exhibit: Callus, Crepitus, Excoriation, Fluctuance, Friable, Induration, Rash, Scarring, Dry/Scaly, Maceration, Atrophie Blanche, Cyanosis, Ecchymosis, Mottled, Pallor, Rubor, Erythema. Periwound temperature was noted as No Abnormality. Wound #49 status is Open. Original cause of wound was Blister. The wound is located on the Left,Medial Lower Leg. The wound measures 0.6cm length x 0.4cm width x 0.1cm  depth; 0.188cm^2 area and 0.019cm^3 volume. The wound is limited to skin breakdown. There is no tunneling or undermining noted. There is a small amount of sanguinous drainage noted. The wound margin is flat and intact. There is medium (34-66%) granulation within the wound bed. There is no necrotic tissue within the wound bed. The periwound skin appearance exhibited: Localized Edema, Moist, Hemosiderin Staining. The periwound skin appearance did not exhibit: Callus, Crepitus, Excoriation, Fluctuance, Friable, Induration, Rash, Scarring, Dry/Scaly, Maceration, Atrophie Blanche, Cyanosis, Ecchymosis, Mottled, Pallor, Rubor, Erythema. Periwound temperature was noted as No Abnormality. 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 New L great toe painful callus. Plan [name], [name] ([id number]) Wound Cleansing: Wound #48 Right,Lateral Lower Leg: Cleanse wound with mild soap and water May shower with protection. Skin Barriers/Peri-Wound Care: Wound #48 Right,Lateral Lower Leg: Barrier cream Primary Wound Dressing: Wound #48 Right,Lateral Lower Leg: Iodoflex Secondary Dressing: Wound #48 Right,Lateral Lower Leg: Gauze, ABD and Kerlix/Conform Dressing Change Frequency: Wound #48 Right,Lateral Lower Leg: Change dressing every week Follow-up Appointments: Wound #48 Right,Lateral Lower Leg: Return Appointment in 1 week. Edema Control: Wound #48 Right,Lateral Lower Leg: 4 Layer Compression System - Bilateral General Notes: Patient will get fitted next visit for custom compression Cadexomer iodine and 4-layer compression. Scheduled to be fitted for new compression stockings next week. Electronic Signature(s) Signed: 11/12/2014 3:18:58 PM By: Loletha Grayer MD Entered By: Loletha Grayer on 11/12/2014 14:18:25 [name] ([id number]) -------------------------------------------------------------------------------- SuperBill Details Patient Name: [name] Date of Service: 11/12/2014 Medical Record Number: [id number] Patient Account Number: [id number] Date of Birth/Sex: Feb 18, 1956 (59 y.o.  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 E11.621 Type 2 diabetes mellitus with foot ulcer I83.222 Varicose veins of left lower extremity with both ulcer of calf and inflammation L84 Corns and callosities Facility Procedures CPT4 Code: [id number] Description: [id number] - PARE BENIGN LES; SGL ICD-10 Description Diagnosis L84 Corns and callosities Modifier: Quantity: 1 Physician Procedures CPT4: Description Modifier Quantity Code [id number] - WC PHYS LEVEL 3 - EST PT 1 ICD-10 Description Diagnosis L84 Corns and callosities 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

## 2014-11-13 NOTE — Progress Notes (Signed)
[name] ([id number]) Visit Report for 11/12/2014 Arrival Information Details Patient Name: [name], [name]. Date of Service: 11/12/2014 8:15 AM Medical Record Number: [id number] Patient Account Number: [id number] Date of Birth/Sex: March 31, 1956 (59 y.o. Female) Treating RN: Junious Dresser Primary Care Physician: [name] Other Clinician: Referring Physician: [name] Treating Physician/Extender: BURNS, Charlean Sanfilippo in Treatment: 58 Visit Information History Since Last Visit Added or deleted any medications: No Patient Arrived: Wheel Chair Any new allergies or adverse reactions: No Arrival Time: 08:28 Had a fall or experienced change in No Accompanied By: self activities of daily living that may affect Transfer Assistance: None risk of falls: Patient Identification Verified: Yes Signs or symptoms of abuse/neglect since last No Secondary Verification Process Yes visito Completed: Hospitalized since last visit: No Patient Requires Transmission- No Has Dressing in Place as Prescribed: Yes Based Precautions: Has Compression in Place as Prescribed: Yes Patient Has Alerts: Yes Pain Present Now: No Patient Alerts: Patient on Blood Thinner Electronic Signature(s) Signed: 11/12/2014 4:49:21 PM By: Sharon Mt Entered By: Sharon Mt on 11/12/2014 16:49:21 [name] ([id number]) -------------------------------------------------------------------------------- Encounter Discharge Information Details Patient Name: [name]. Date of Service: 11/12/2014 8:15 AM Medical Record Number: [id number] Patient Account Number: [id number] Date of Birth/Sex: [birth year] (59 y.o. Female) Treating RN: Primary Care Physician: [name] Other Clinician: Referring Physician: [name] Treating Physician/Extender: Suella Grove in Treatment: 41 Encounter Discharge Information Items Schedule Follow-up Appointment: No Medication Reconciliation completed No and  provided to Patient/Care Collis Thede: Provided on Clinical Summary of Care: 11/12/2014 Form Type Recipient Paper Patient GI Electronic Signature(s) Signed: 11/12/2014 9:20:07 AM By: Ruthine Dose Entered By: Ruthine Dose on 11/12/2014 09:20:07 [name] ([id number]) -------------------------------------------------------------------------------- Lower Extremity Assessment Details Patient Name: [name]. Date of Service: 11/12/2014 8:15 AM Medical Record Number: [id number] Patient Account Number: [id number] Date of Birth/Sex: 1956-01-14 (59 y.o. Female) Treating RN: Junious Dresser Primary Care Physician: [name] Other Clinician: Referring Physician: [name] Treating Physician/Extender: Suella Grove in Treatment: 85 Edema Assessment Assessed: [Left: Yes] [Right: Yes] Edema: [Left: Yes] [Right: Yes] Calf Left: Right: Point of Measurement: 24 cm From Medial Instep 47 cm 39 cm Ankle Left: Right: Point of Measurement: 8 cm From Medial Instep 23.7 cm 22.2 cm Vascular Assessment Claudication: Claudication Assessment [Left:None] [Right:None] Pulses: Posterior Tibial Palpable: [Left:Yes] [Right:Yes] Dorsalis Pedis Palpable: [Left:Yes] [Right:Yes] Extremity colors, hair growth, and conditions: Extremity Color: [Left:Hyperpigmented] [Right:Hyperpigmented] Hair Growth on Extremity: [Left:No] [Right:No] Temperature of Extremity: [Left:Warm] [Right:Warm] Capillary Refill: [Left:< 3 seconds] [Right:< 3 seconds] Dependent Rubor: [Left:No] [Right:No] Blanched when Elevated: [Left:No] [Right:No] Toe Nail Assessment Left: Right: Thick: Yes Yes Discolored: Yes Yes Deformed: Yes Yes Improper Length and Hygiene: Yes Yes KIRK, CHEFF ([id number]) Electronic Signature(s) Signed: 11/12/2014 4:50:44 PM By: Junious Dresser RN Entered By: Junious Dresser on 11/12/2014 08:39:52 [name]  ([id number]) -------------------------------------------------------------------------------- Multi Wound Chart Details Patient Name: [name]. Date of Service: 11/12/2014 8:15 AM Medical Record Number: [id number] Patient Account Number: [id number] Date of Birth/Sex: [birth year] (59 y.o. Female) Treating RN: Baruch Gouty, RN, BSN, Velva Harman Primary Care Physician: [name] Other Clinician: Referring Physician: [name] Treating Physician/Extender: Suella Grove in Treatment: 67 Vital Signs Height(in): Pulse(bpm): 64 Weight(lbs): Blood Pressure 214/72 (mmHg): Body Mass Index(BMI): Temperature(F): 98.0 Respiratory Rate 20 (breaths/min): Photos: [48:No Photos] [49:No Photos] [N/A:N/A] Wound Location: [48:Right Lower Leg - Lateral] [49:Left Lower Leg - Medial] [N/A:N/A] Wounding Event: [48:Blister] [49:Blister] [N/A:N/A] Primary Etiology: [48:Venous Leg Ulcer] [49:Venous Leg Ulcer] [N/A:N/A] Comorbid History: [48:Lymphedema, Hypertension, Type II Diabetes] [49:Lymphedema, Hypertension, Type  II Diabetes] [N/A:N/A] Date Acquired: [48:10/29/2014] [49:11/03/2014] [N/A:N/A] Weeks of Treatment: [48:2] [49:1] [N/A:N/A] Wound Status: [48:Open] [49:Open] [N/A:N/A] Measurements L x W x D 0.4x0.9x0.1 [49:0.6x0.4x0.1] [N/A:N/A] (cm) Area (cm) : [48:0.283] [49:0.188] [N/A:N/A] Volume (cm) : [48:0.028] [49:0.019] [N/A:N/A] % Reduction in Area: [48:97.50%] [49:97.40%] [N/A:N/A] % Reduction in Volume: 97.50% [49:97.40%] [N/A:N/A] Classification: [48:Partial Thickness] [49:Partial Thickness] [N/A:N/A] HBO Classification: [48:Grade 1] [49:Grade 0] [N/A:N/A] Exudate Amount: [48:Medium] [49:Small] [N/A:N/A] Exudate Type: [48:Sanguinous] [49:Sanguinous] [N/A:N/A] Exudate Color: [48:red] [49:red] [N/A:N/A] Wound Margin: [48:Distinct, outline attached] [49:Flat and Intact] [N/A:N/A] Granulation Amount: [48:Medium (34-66%)] [49:Medium (34-66%)] [N/A:N/A] Granulation Quality: [48:Red] [49:N/A]  [N/A:N/A] Necrotic Amount: [48:None Present (0%)] [49:None Present (0%)] [N/A:N/A] Exposed Structures: [48:Fascia: No Fat: No Tendon: No Muscle: No Joint: No] [49:Fascia: No Fat: No Tendon: No Muscle: No Joint: No] [N/A:N/A] Bone: No Bone: No Limited to Skin Limited to Skin Breakdown Breakdown Epithelialization: Medium (34-66%) Medium (34-66%) N/A Periwound Skin Texture: Edema: Yes Edema: Yes N/A Excoriation: No Excoriation: No Induration: No Induration: No Callus: No Callus: No Crepitus: No Crepitus: No Fluctuance: No Fluctuance: No Friable: No Friable: No Rash: No Rash: No Scarring: No Scarring: No Periwound Skin Moist: Yes Moist: Yes N/A Moisture: Maceration: No Maceration: No Dry/Scaly: No Dry/Scaly: No Periwound Skin Color: Hemosiderin Staining: Yes Hemosiderin Staining: Yes N/A Atrophie Blanche: No Atrophie Blanche: No Cyanosis: No Cyanosis: No Ecchymosis: No Ecchymosis: No Erythema: No Erythema: No Mottled: No Mottled: No Pallor: No Pallor: No Rubor: No Rubor: No Temperature: No Abnormality No Abnormality N/A Tenderness on No No N/A Palpation: Wound Preparation: Ulcer Cleansing: Ulcer Cleansing: N/A Rinsed/Irrigated with Rinsed/Irrigated with Saline, Other: soap and Saline, Other: soap and water water Topical Anesthetic Topical Anesthetic Applied: None Applied: None Treatment Notes Electronic Signature(s) Signed: 11/12/2014 5:13:49 PM By: Regan Lemming BSN, RN Entered By: Regan Lemming on 11/12/2014 08:59:38 [name] ([id number]) -------------------------------------------------------------------------------- Multi-Disciplinary Care Plan Details Patient Name: [name]. Date of Service: 11/12/2014 8:15 AM Medical Record Number: [id number] Patient Account Number: [id number] Date of Birth/Sex: 04-04-1956 (59 y.o. Female) Treating RN: Afful, RN, BSN, Velva Harman Primary Care Physician: [name] Other Clinician: Referring Physician:  [name] Treating Physician/Extender: Suella Grove in Treatment: 24 Active Inactive Nutrition Nursing Diagnoses: Impaired glucose control: actual or potential Goals: Patient/caregiver verbalizes understanding of need to maintain therapeutic glucose control per primary care physician Date Initiated: 12/16/2013 Goal Status: Active Interventions: Provide education on elevated blood sugars and impact on wound healing Notes: Soft Tissue Infection Nursing Diagnoses: Impaired tissue integrity Goals: Patient will remain free of wound infection Date Initiated: 12/16/2013 Goal Status: Active Interventions: Assess signs and symptoms of infection every visit Notes: Venous Leg Ulcer Nursing Diagnoses: Knowledge deficit related to disease process and management Potential for venous Insuffiency (use before diagnosis confirmed) GoalsROSEBELL, SUMMEROUR ([id number]) Patient will maintain optimal edema control Date Initiated: 08/18/2014 Goal Status: Active Interventions: Assess peripheral edema status every visit. Compression as ordered Treatment Activities: Therapeutic compression applied : 11/12/2014 Notes: Electronic Signature(s) Signed: 11/12/2014 5:13:49 PM By: Regan Lemming BSN, RN Entered By: Regan Lemming on 11/12/2014 08:59:24 [name] ([id number]) -------------------------------------------------------------------------------- Pain Assessment Details Patient Name: [name]. Date of Service: 11/12/2014 8:15 AM Medical Record Number: [id number] Patient Account Number: [id number] Date of Birth/Sex: 18-Jul-1955 (59 y.o. Female) Treating RN: Junious Dresser Primary Care Physician: [name] Other Clinician: Referring Physician: [name] Treating Physician/Extender: Suella Grove in Treatment: 91 Active Problems Location of Pain Severity and Description of Pain Patient Has Paino No Site Locations Pain Management and Medication Current Pain Management:  Electronic  Signature(s) Signed: 11/12/2014 4:50:44 PM By: Junious Dresser RN Entered By: Junious Dresser on 11/12/2014 08:30:30 [name] ([id number]) -------------------------------------------------------------------------------- Patient/Caregiver Education Details Patient Name: [name] Date of Service: 11/12/2014 8:15 AM Medical Record Number: [id number] Patient Account Number: [id number] Date of Birth/Gender: [birth year] (59 y.o. Female) Treating RN: Junious Dresser Primary Care Physician: [name] Other Clinician: Referring Physician: [name] Treating Physician/Extender: Suella Grove in Treatment: 61 Education Assessment Education Provided To: Patient Education Topics Provided Safety: Methods: Explain/Verbal Responses: State content correctly Venous: Methods: Explain/Verbal Responses: State content correctly Wound/Skin Impairment: Methods: Explain/Verbal Responses: State content correctly Electronic Signature(s) Signed: 11/12/2014 4:50:44 PM By: Junious Dresser RN Entered By: Junious Dresser on 11/12/2014 10:23:21 [name] ([id number]) -------------------------------------------------------------------------------- Wound Assessment Details Patient Name: [name]. Date of Service: 11/12/2014 8:15 AM Medical Record Number: [id number] Patient Account Number: [id number] Date of Birth/Sex: [birth year] (59 y.o. Female) Treating RN: Junious Dresser Primary Care Physician: [name] Other Clinician: Referring Physician: [name] Treating Physician/Extender: Suella Grove in Treatment: 85 Wound Status Wound Number: 29 Primary Venous Leg Ulcer Etiology: Wound Location: Right Lower Leg - Lateral Wound Status: Open Wounding Event: Blister Comorbid Lymphedema, Hypertension, Type II Date Acquired: 10/29/2014 History: Diabetes Weeks Of Treatment: 2 Clustered Wound: No Photos Photo Uploaded By: Junious Dresser on 11/12/2014 12:17:52 Wound Measurements Length:  (cm) 0.4 Width: (cm) 0.9 Depth: (cm) 0.1 Area: (cm) 0.283 Volume: (cm) 0.028 % Reduction in Area: 97.5% % Reduction in Volume: 97.5% Epithelialization: Medium (34-66%) Tunneling: No Undermining: No Wound Description Classification: Partial Thickness Foul O Diabetic Severity (Wagner): Grade 1 Wound Margin: Distinct, outline attached Exudate Amount: Medium Exudate Type: Sanguinous Exudate Color: red dor After Cleansing: No Wound Bed Granulation Amount: Medium (34-66%) Exposed Structure Granulation Quality: Red Fascia Exposed: No Necrotic Amount: None Present (0%) Fat Layer Exposed: No Hessel, Ginevra K. ([id number]) Tendon Exposed: No Muscle Exposed: No Joint Exposed: No Bone Exposed: No Limited to Skin Breakdown Periwound Skin Texture Texture Color No Abnormalities Noted: No No Abnormalities Noted: No Callus: No Atrophie Blanche: No Crepitus: No Cyanosis: No Excoriation: No Ecchymosis: No Fluctuance: No Erythema: No Friable: No Hemosiderin Staining: Yes Induration: No Mottled: No Localized Edema: Yes Pallor: No Rash: No Rubor: No Scarring: No Temperature / Pain Moisture Temperature: No Abnormality No Abnormalities Noted: No Dry / Scaly: No Maceration: No Moist: Yes Wound Preparation Ulcer Cleansing: Rinsed/Irrigated with Saline, Other: soap and water, Topical Anesthetic Applied: None Treatment Notes Wound #48 (Right, Lateral Lower Leg) 1. Cleansed with: Cleanse wound with antibacterial soap and water 4. Dressing Applied: Iodoflex 5. Secondary Dressing Applied ABD and Kerlix/Conform 7. Secured with 4 Layer Compression System - Bilateral Electronic Signature(s) Signed: 11/12/2014 4:50:44 PM By: Junious Dresser RN Entered By: Junious Dresser on 11/12/2014 08:58:00 [name] ([id number]) -------------------------------------------------------------------------------- Wound Assessment Details Patient Name: [name] Date of  Service: 11/12/2014 8:15 AM Medical Record Number: [id number] Patient Account Number: [id number] Date of Birth/Sex: [birth year] (59 y.o. Female) Treating RN: Junious Dresser Primary Care Physician: [name] Other Clinician: Referring Physician: [name] Treating Physician/Extender: Suella Grove in Treatment: 85 Wound Status Wound Number: 49 Primary Venous Leg Ulcer Etiology: Wound Location: Left Lower Leg - Medial Wound Status: Open Wounding Event: Blister Comorbid Lymphedema, Hypertension, Type II Date Acquired: 11/03/2014 History: Diabetes Weeks Of Treatment: 1 Clustered Wound: No Photos Photo Uploaded By: Junious Dresser on 11/12/2014 12:18:27 Wound Measurements Length: (cm) 0.6 Width: (cm) 0.4 Depth: (cm) 0.1 Area: (cm) 0.188 Volume: (cm) 0.019 % Reduction in Area: 97.4% %  Reduction in Volume: 97.4% Epithelialization: Medium (34-66%) Tunneling: No Undermining: No Wound Description Classification: Partial Thickness Diabetic Severity Earleen Newport): Grade 0 Wound Margin: Flat and Intact Exudate Amount: Small Exudate Type: Sanguinous Exudate Color: red Wound Bed Granulation Amount: Medium (34-66%) Exposed Structure Necrotic Amount: None Present (0%) Fascia Exposed: No Fat Layer Exposed: No Khader, Marcelia K. ([id number]) Tendon Exposed: No Muscle Exposed: No Joint Exposed: No Bone Exposed: No Limited to Skin Breakdown Periwound Skin Texture Texture Color No Abnormalities Noted: No No Abnormalities Noted: No Callus: No Atrophie Blanche: No Crepitus: No Cyanosis: No Excoriation: No Ecchymosis: No Fluctuance: No Erythema: No Friable: No Hemosiderin Staining: Yes Induration: No Mottled: No Localized Edema: Yes Pallor: No Rash: No Rubor: No Scarring: No Temperature / Pain Moisture Temperature: No Abnormality No Abnormalities Noted: No Dry / Scaly: No Maceration: No Moist: Yes Wound Preparation Ulcer Cleansing: Rinsed/Irrigated with Saline, Other: soap  and water, Topical Anesthetic Applied: None Treatment Notes Wound #49 (Left, Medial Lower Leg) 1. Cleansed with: Cleanse wound with antibacterial soap and water 4. Dressing Applied: Iodoflex 5. Secondary Dressing Applied ABD and Kerlix/Conform 7. Secured with 4 Layer Compression System - Bilateral Electronic Signature(s) Signed: 11/12/2014 4:50:44 PM By: Junious Dresser RN Entered By: Junious Dresser on 11/12/2014 08:56:02 [name] ([id number]) -------------------------------------------------------------------------------- Vitals Details Patient Name: [name] Date of Service: 11/12/2014 8:15 AM Medical Record Number: [id number] Patient Account Number: [id number] Date of Birth/Sex: 1955-09-26 (59 y.o. Female) Treating RN: Junious Dresser Primary Care Physician: [name] Other Clinician: Referring Physician: [name] Treating Physician/Extender: Suella Grove in Treatment: 46 Vital Signs Time Taken: 08:30 Temperature (F): 98.0 Pulse (bpm): 64 Respiratory Rate (breaths/min): 20 Blood Pressure (mmHg): 214/72 Reference Range: 80 - 120 mg / dl Notes CM notified of elevated BP and pt asymptomatic. Pt took her BP medication around 7-7:30 am this morning. Pt is instructed to f/u with her PCP. Electronic Signature(s) Signed: 11/12/2014 4:50:44 PM By: Junious Dresser RN Entered By: Junious Dresser on 11/12/2014 08:33:01

## 2014-11-14 DIAGNOSIS — L97219 Non-pressure chronic ulcer of right calf with unspecified severity: Secondary | ICD-10-CM | POA: Diagnosis not present

## 2014-11-14 NOTE — Progress Notes (Signed)
TACARRA, JANN ([id number]) Visit Report for 11/14/2014 Arrival Information Details Patient Name: [name], [name]. Date of Service: 11/14/2014 8:00 AM Medical Record Number: [id number] Patient Account Number: [id number] Date of Birth/Sex: 06-29-1955 (59 y.o. Female) Treating RN: Montey Hora Primary Care Physician: [name] Other Clinician: Referring Physician: [name] Treating Physician/Extender: Frann Rider in Treatment: 21 Visit Information History Since Last Visit Added or deleted any medications: No Patient Arrived: Cane Any new allergies or adverse reactions: No Arrival Time: 08:10 Had a fall or experienced change in No Accompanied By: self activities of daily living that may affect Transfer Assistance: None risk of falls: Patient Identification Verified: Yes Signs or symptoms of abuse/neglect since last No Secondary Verification Process Yes visito Completed: Hospitalized since last visit: No Patient Requires Transmission- No Pain Present Now: No Based Precautions: Patient Has Alerts: Yes Patient Alerts: Patient on Blood Thinner Electronic Signature(s) Signed: 11/14/2014 8:49:02 AM By: Montey Hora Entered By: Montey Hora on 11/14/2014 08:49:02 [name] ([id number]) -------------------------------------------------------------------------------- Encounter Discharge Information Details Patient Name: [name]. Date of Service: 11/14/2014 8:00 AM Medical Record Number: [id number] Patient Account Number: [id number] Date of Birth/Sex: 08-19-1955 (59 y.o. Female) Treating RN: Montey Hora Primary Care Physician: [name] Other Clinician: Referring Physician: [name] Treating Physician/Extender: Frann Rider in Treatment: 10 Encounter Discharge Information Items Discharge Pain Level: 0 Discharge Condition: Stable Ambulatory Status: Cane Discharge Destination:  Home Private Transportation: Auto Accompanied By: self Schedule Follow-up Appointment: Yes Medication Reconciliation completed and No provided to Patient/Care Evan Mackie: Clinical Summary of Care: Electronic Signature(s) Signed: 11/14/2014 8:50:38 AM By: Montey Hora Entered By: Montey Hora on 11/14/2014 08:50:38 [name] ([id number]) -------------------------------------------------------------------------------- Patient/Caregiver Education Details Patient Name: [name] Date of Service: 11/14/2014 8:00 AM Medical Record Number: [id number] Patient Account Number: [id number] Date of Birth/Gender: February 13, 1956 (59 y.o. Female) Treating RN: Montey Hora Primary Care Physician: [name] Other Clinician: Referring Physician: [name] Treating Physician/Extender: Frann Rider in Treatment: 12 Education Assessment Education Provided To: Patient Education Topics Provided Wound/Skin Impairment: Handouts: Other: call Monday if wrap slips Methods: Explain/Verbal Responses: State content correctly Electronic Signature(s) Signed: 11/14/2014 8:50:17 AM By: Montey Hora Entered By: Montey Hora on 11/14/2014 08:50:17

## 2014-11-19 ENCOUNTER — Encounter: Payer: Managed Care, Other (non HMO) | Admitting: Surgery

## 2014-11-19 DIAGNOSIS — L97219 Non-pressure chronic ulcer of right calf with unspecified severity: Secondary | ICD-10-CM | POA: Diagnosis not present

## 2014-11-19 NOTE — Progress Notes (Addendum)
[name], [name] ([id number]) Visit Report for 11/19/2014 Chief Complaint Document Details Patient Name: [name], [name]. Date of Service: 11/19/2014 8:15 AM Medical Record Number: [id number] Patient Account Number: [id number] Date of Birth/Sex: 03-30-1956 (59 y.o. 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 New painful L great toe callus and ulcer. Electronic Signature(s) Signed: 11/19/2014 4:38:40 PM By: Loletha Grayer MD Entered By: Loletha Grayer on 11/19/2014 11:11:15 [name] ([id number]) -------------------------------------------------------------------------------- Debridement Details Patient Name: [name] Date of Service: 11/19/2014 8:15 AM Medical Record Number: [id number] Patient Account Number: [id number] Date of Birth/Sex: 1956/05/13 (59 y.o. Female) Treating RN: Primary Care Physician: [name] Other Clinician: Referring Physician: [name] Treating Physician/Extender: BURNS III, Charlean Sanfilippo in Treatment: 86 Debridement Performed for Wound #51 Left Toe Great Assessment: Performed By: Physician BURNS III, Ryan Palermo, Debridement: Debridement Pre-procedure Yes Verification/Time Out Taken: Start Time: 09:00 Pain Control: Lidocaine 2% Topical Gel Level: Skin/Subcutaneous Tissue Total Area Debrided (L x 0.3 (cm) x 0.3 (cm) = 0.09 (cm) W): Tissue and other Viable, Non-Viable, Callus, Fat, Fibrin/Slough, Subcutaneous material debrided: Instrument: Curette Bleeding: Minimum Hemostasis Achieved: Silver Nitrate End Time: 09:01 Procedural Pain: 0 Post Procedural Pain: 0 Response to Treatment: Procedure was tolerated well Post Debridement Measurements of Total  Wound Length: (cm) 0.3 Width: (cm) 0.3 Depth: (cm) 0.3 Volume: (cm) 0.021 Electronic Signature(s) Signed: 11/19/2014 4:38:40 PM By: Loletha Grayer MD Entered By: Loletha Grayer on 11/19/2014 11:00:04 [name] ([id number]) -------------------------------------------------------------------------------- HPI Details Patient Name: [name] Date of Service: 11/19/2014 8:15 AM Medical Record Number: [id number] Patient Account Number: [id number] Date of Birth/Sex: 03-25-1956 (59 y.o. Female) Treating RN: Primary Care Physician: [name] Other Clinician: Referring Physician: [name] Treating Physician/Extender: BURNS III, Jahmar Mckelvy Weeks in Treatment: 4 History of Present Illness HPI Description: 59yo w/ h/o BLE phlebolymphedema, type 2 DM, and obesity. Has a h/o chronic, recurrent BLE calf ulcers. Tolerating 4 layer compression. Ulcers recently healed, but recurred. Infrequently uses lymphedema pump. Fitted today for custom compression stockings. In the past she has developed recurrent ulcers when compression bandages discontinued and juxtalite compression initiated. She returns to clinic today and complains of a persistent painful callus on L great toe. Mild calf pain. No F/C. Moderate clear drainage from R calf ulcer. Electronic Signature(s) Signed: 11/19/2014 4:38:40 PM By: Loletha Grayer MD Entered By: Loletha Grayer on 11/19/2014 11:12:22 [name] ([id number]) -------------------------------------------------------------------------------- Physical Exam Details Patient Name: [name] Date of Service: 11/19/2014 8:15 AM Medical Record Number: [id number] Patient Account Number: [id number] Date of Birth/Sex: 1956-04-12 (59 y.o. Female) Treating RN: Primary Care Physician: [name] Other Clinician: Referring Physician: [name] Treating Physician/Extender: BURNS III, Dannell Gortney Weeks in Treatment:  86 Constitutional . Pulse regular. Respirations normal and unlabored. Afebrile. Marland Kitchen Respiratory WNL. No retractions.. Cardiovascular Pedal Pulses WNL. Integumentary (Hair, Skin) .Marland Kitchen Neurological . Psychiatric Judgement and insight Intact.. Oriented times 3.. No evidence of depression, anxiety, or agitation.. Notes R lateral calf ulcer. Partial thickness. No cellulitis. Palpable DP bilaterally. New raised callus on L great toe with underlying ulcer. Sharply debrided. No probe to bone. Electronic Signature(s) Signed: 11/19/2014 4:38:40 PM By: Loletha Grayer MD Entered By: Loletha Grayer on 11/19/2014 11:13:47 [name] ([id number]) -------------------------------------------------------------------------------- Physician Orders Details Patient Name: [name] Date of Service: 11/19/2014  8:15 AM Medical Record Number: [id number] Patient Account Number: [id number] Date of Birth/Sex: December 26, 1955 (59 y.o. Female) Treating RN: Afful, RN, BSN, Velva Harman Primary Care Physician: [name] Other Clinician: Referring Physician: [name] Treating Physician/Extender: BURNS III, Charlean Sanfilippo in Treatment: 62 Verbal / Phone Orders: Yes Clinician: Afful, RN, BSN, Rita Read Back and Verified: Yes Diagnosis Coding Wound Cleansing Wound #51 Left Toe Great o Cleanse wound with mild soap and water o May Shower, gently pat wound dry prior to applying new dressing. o May shower with protection. Skin Barriers/Peri-Wound Care Wound #50 Right,Proximal,Lateral Lower Leg o Moisturizing lotion Wound #51 Left Toe Great o Moisturizing lotion Primary Wound Dressing Wound #50 Right,Proximal,Lateral Lower Leg o Iodoflex Wound #51 Left Toe Great o Prisma Ag Secondary Dressing Wound #51 Left Toe Great o Gauze and Kerlix/Conform Wound #50 Right,Proximal,Lateral Lower Leg o Gauze, ABD and Kerlix/Conform Dressing Change Frequency Wound #50  Right,Proximal,Lateral Lower Leg o Change dressing every week Wound #51 Left Toe Great o Change dressing every other day. Follow-up Appointments Wound #50 Right,Proximal,Lateral Lower Leg Twining, Nikkole K. ([id number]) o Return Appointment in 1 week. Wound #51 Left Toe Great o Return Appointment in 1 week. Edema Control Wound #50 Right,Proximal,Lateral Lower Leg o 4 Layer Compression System - Bilateral Wound #51 Left Toe Great o 4 Layer Compression System - Bilateral Electronic Signature(s) Signed: 11/19/2014 9:55:06 AM By: Regan Lemming BSN, RN Entered By: Regan Lemming on 11/19/2014 09:55:05 [name] ([id number]) -------------------------------------------------------------------------------- Problem List Details Patient Name: [name]. Date of Service: 11/19/2014 8:15 AM Medical Record Number: [id number] Patient Account Number: [id number] Date of Birth/Sex: 25-Mar-1956 (59 y.o. Female) Treating RN: Primary Care Physician: [name] Other Clinician: Referring Physician: [name] Treating Physician/Extender: BURNS III, Charlean Sanfilippo in Treatment: 14 Active Problems ICD-9 Encounter Code Description Active Date Diagnosis 457.1 Other Lymphedema - excludes congenital, eyelid or vulva 03/27/2013 Yes 459.33 Chronic venous hypertension with ulcer and inflammation 03/27/2013 Yes 707.12 Ulcer of lower limbs, except pressure ulcer; Ulcer of calf 03/27/2013 Yes ICD-10 Encounter Code Description Active Date Diagnosis I89.0 Lymphedema, not elsewhere classified 03/26/2014 Yes E66.9 Obesity, unspecified 03/26/2014 Yes B35.3 Tinea pedis 08/01/2014 Yes 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 ([id number]) Resolved Problems ICD-10 Code Description Active Date Resolved Date I83.222  Varicose veins of left lower extremity with both ulcer of 11/12/2014 11/12/2014 calf and inflammation Electronic Signature(s) Signed: 11/19/2014 4:38:40 PM By: Loletha Grayer MD Entered By: Loletha Grayer on 11/19/2014 10:58:40 [name] ([id number]) -------------------------------------------------------------------------------- Progress Note Details Patient Name: [name]. Date of Service: 11/19/2014 8:15 AM Medical Record Number: [id number] Patient Account Number: [id number] Date of Birth/Sex: 20-May-1956 (59 y.o. Female) Treating RN: Primary Care Physician: [name] Other Clinician: Referring Physician: [name] Treating Physician/Extender: BURNS III, Charlean Sanfilippo in Treatment: 29 Subjective Chief Complaint Information obtained from Patient Bilateral lower extremity phlebolymphedema and ulcerations since May 2014. New painful L great toe callus and ulcer. History of Present Illness (HPI) 59yo w/ h/o BLE phlebolymphedema, type 2 DM, and obesity. Has a h/o chronic, recurrent BLE calf ulcers. Tolerating 4 layer compression. Ulcers recently healed, but recurred. Infrequently uses lymphedema pump. Fitted today for custom compression stockings. In the past she has developed recurrent ulcers when compression bandages discontinued and juxtalite compression initiated. She returns to clinic today and complains of a persistent painful callus  on L great toe. Mild calf pain. No F/C. Moderate clear drainage from R calf ulcer. Objective Constitutional Pulse regular. Respirations normal and unlabored. Afebrile. Vitals Time Taken: 8:52 AM, Temperature: 98.1 F, Pulse: 55 bpm, Respiratory Rate: 18 breaths/min, Blood Pressure: 162/52 mmHg. Respiratory WNL. No retractions.. Cardiovascular Pedal Pulses WNL. Psychiatric Judgement and insight Intact.. Oriented times 3.. No evidence of depression, anxiety, or agitation.[name] ([id number]) General  Notes: R lateral calf ulcer. Partial thickness. No cellulitis. Palpable DP bilaterally. New raised callus on L great toe with underlying ulcer. Sharply debrided. No probe to bone. Integumentary (Hair, Skin) Wound #48 status is Open. Original cause of wound was Blister. The wound is located on the Right,Lateral Lower Leg. The wound measures 0cm length x 0cm width x 0cm depth; 0cm^2 area and 0cm^3 volume. Wound #49 status is Open. Original cause of wound was Blister. The wound is located on the Left,Medial Lower Leg. The wound measures 0cm length x 0cm width x 0cm depth; 0cm^2 area and 0cm^3 volume. Wound #50 status is Open. Original cause of wound was Blister. The wound is located on the Right,Proximal,Lateral Lower Leg. The wound measures 5.5cm length x 7.4cm width x 0.1cm depth; 31.966cm^2 area and 3.197cm^3 volume. The wound is limited to skin breakdown. There is no tunneling or undermining noted. There is a large amount of serous drainage noted. The wound margin is flat and intact. There is large (67-100%) red granulation within the wound bed. There is no necrotic tissue within the wound bed. The periwound skin appearance did not exhibit: Callus, Crepitus, Excoriation, Fluctuance, Friable, Induration, Localized Edema, Rash, Scarring, Dry/Scaly, Maceration, Moist, Atrophie Blanche, Cyanosis, Ecchymosis, Hemosiderin Staining, Mottled, Pallor, Rubor, Erythema. Periwound temperature was noted as No Abnormality. Wound #51 status is Open. Original cause of wound was Gradually Appeared. The wound is located on the Left Toe Great. The wound measures 0.3cm length x 0.3cm width x 0.2cm depth; 0.071cm^2 area and 0.014cm^3 volume. The wound is limited to skin breakdown. There is no tunneling or undermining noted. There is a small amount of serous drainage noted. The wound margin is distinct with the outline attached to the wound base. There is large (67-100%) pink, pale granulation within the wound bed.  There is no necrotic tissue within the wound bed. The periwound skin appearance exhibited: Localized Edema, Moist. The periwound skin appearance did not exhibit: Callus, Crepitus, Excoriation, Fluctuance, Friable, Induration, Rash, Scarring, Dry/Scaly, Maceration, Atrophie Blanche, Cyanosis, Ecchymosis, Hemosiderin Staining, Mottled, Pallor, Rubor, Erythema. Periwound temperature was noted as No Abnormality. 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 ([id number]) Bilateral lower extremity phlebolymphedema and right calf ulceration. New left great toe ulceration. Procedures Wound #51 Wound #51 is a Diabetic Wound/Ulcer of the Lower Extremity located on the Left Toe Great . There was a Skin/Subcutaneous Tissue Debridement [id number]) debridement with total area of 0.09 sq cm performed by BURNS III, Audryna Wendt. with the following instrument(s): Curette to remove Viable and Non- Viable tissue/material including Fat, Fibrin/Slough, Callus, and Subcutaneous after achieving pain control using Lidocaine 2% Topical Gel. A time out was conducted prior  to the start of the procedure. A Minimum amount of bleeding was controlled with Silver Nitrate. The procedure was tolerated well with a pain level of 0 throughout and a pain level of 0 following the procedure. Post Debridement Measurements: 0.3cm length x 0.3cm width x 0.3cm depth; 0.021cm^3 volume. Plan Wound Cleansing: Wound #51 Left Toe Great: Cleanse wound with mild soap and water May Shower,  gently pat wound dry prior to applying new dressing. May shower with protection. Skin Barriers/Peri-Wound Care: Wound #50 Right,Proximal,Lateral Lower Leg: Moisturizing lotion Wound #51 Left Toe Great: Moisturizing lotion Primary Wound Dressing: Wound #50 Right,Proximal,Lateral Lower Leg: Iodoflex Wound #51 Left Toe Great: Prisma Ag Secondary Dressing: Wound #51 Left Toe Great: Gauze and Kerlix/Conform Wound #50 Right,Proximal,Lateral Lower Leg: Gauze, ABD and Kerlix/Conform Dressing Change FrequencyNESSA, [name]. ([id number]) Wound #50 Right,Proximal,Lateral Lower Leg: Change dressing every week Wound #51 Left Toe Great: Change dressing every other day. Follow-up Appointments: Wound #50 Right,Proximal,Lateral Lower Leg: Return Appointment in 1 week. Wound #51 Left Toe Great: Return Appointment in 1 week. Edema Control: Wound #50 Right,Proximal,Lateral Lower Leg: 4 Layer Compression System - Bilateral Wound #51 Left Toe Great: 4 Layer Compression System - Bilateral MRI done in 4-layer compression. Patient was fitted for custom compression stockings today. Prisma to left great toe ulceration. Offloading with open toed shoes. Electronic Signature(s) Signed: 11/19/2014 4:38:40 PM By: Loletha Grayer MD Entered By: Loletha Grayer on 11/19/2014 11:15:10 [name] ([id number]) -------------------------------------------------------------------------------- SuperBill Details Patient Name: [name] Date of Service: 11/19/2014 Medical Record Number: [id number] Patient Account Number: [id number] Date of Birth/Sex: 24-Mar-1956 (59 y.o. 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 E11.621 Type 2 diabetes mellitus with foot ulcer L84 Corns and callosities S91.102A Unspecified open wound of left great toe without damage to nail, initial encounter Facility Procedures CPT4 Code: [id number] Description: 11042 - DEB SUBQ TISSUE 20 SQ CM/< ICD-10 Description Diagnosis E11.621 Type 2 diabetes mellitus with foot ulcer Modifier: Quantity: 1 Physician Procedures CPT4: Description Modifier Quantity Code [id number] - WC PHYS LEVEL 3 - EST PT 1 ICD-10 Description Diagnosis 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

## 2014-11-20 NOTE — Progress Notes (Addendum)
TEATHER, JASSO ([id number]) Visit Report for 11/19/2014 Arrival Information Details Patient Name: [name]. Date of Service: 11/19/2014 8:15 AM Medical Record Number: [id number] Patient Account Number: [id number] Date of Birth/Sex: 01-Aug-1955 (59 y.o. Female) Treating RN: Montey Hora Primary Care Physician: [name] Other Clinician: Referring Physician: [name] Treating Physician/Extender: BURNS III, Charlean Sanfilippo in Treatment: 28 Visit Information History Since Last Visit Added or deleted any medications: No Patient Arrived: Cane Any new allergies or adverse reactions: No Arrival Time: 08:25 Had a fall or experienced change in No Accompanied By: self activities of daily living that may affect Transfer Assistance: None risk of falls: Patient Identification Verified: Yes Signs or symptoms of abuse/neglect since last No Secondary Verification Process Yes visito Completed: Hospitalized since last visit: No Patient Requires Transmission- No Pain Present Now: No Based Precautions: Patient Has Alerts: Yes Patient Alerts: Patient on Blood Thinner Electronic Signature(s) Signed: 11/19/2014 5:42:15 PM By: Montey Hora Entered By: Montey Hora on 11/19/2014 08:58:51 [name] ([id number]) -------------------------------------------------------------------------------- Encounter Discharge Information Details Patient Name: [name]. Date of Service: 11/19/2014 8:15 AM Medical Record Number: [id number] Patient Account Number: [id number] Date of Birth/Sex: May 12, 1956 (59 y.o. Female) Treating RN: Primary Care Physician: [name] Other Clinician: Referring Physician: [name] Treating Physician/Extender: BURNS III, Charlean Sanfilippo in Treatment: 76 Encounter Discharge Information Items Discharge Pain Level: 0 Discharge Condition: Stable Ambulatory Status: Cane Discharge Destination: Home Transportation: Private Auto Accompanied  By: self Schedule Follow-up Appointment: Yes Medication Reconciliation completed No and provided to Patient/Care [name]: Provided on Clinical Summary of Care: 11/19/2014 Form Type Recipient Paper Patient GI Electronic Signature(s) Signed: 11/19/2014 12:21:18 PM By: Montey Hora Previous Signature: 11/19/2014 10:27:07 AM Version By: Ruthine Dose Entered By: Montey Hora on 11/19/2014 12:21:18 [name] ([id number]) -------------------------------------------------------------------------------- Lower Extremity Assessment Details Patient Name: [name]. Date of Service: 11/19/2014 8:15 AM Medical Record Number: [id number] Patient Account Number: [id number] Date of Birth/Sex: 1955/12/20 (59 y.o. Female) Treating RN: Montey Hora Primary Care Physician: [name] Other Clinician: Referring Physician: [name] Treating Physician/Extender: BURNS III, Charlean Sanfilippo in Treatment: 86 Edema Assessment Assessed: [Left: No] [Right: No] E[Left: dema] [Right: :] Calf Left: Right: Point of Measurement: 24 cm From Medial Instep 45.7 cm 40.7 cm Ankle Left: Right: Point of Measurement: 8 cm From Medial Instep 24.4 cm 22.2 cm Vascular Assessment Pulses: Posterior Tibial Palpable: [Left:Yes] [Right:Yes] Dorsalis Pedis Palpable: [Left:Yes] [Right:Yes] Extremity colors, hair growth, and conditions: Extremity Color: [Left:Hyperpigmented] [Right:Hyperpigmented] Hair Growth on Extremity: [Left:No] [Right:No] Temperature of Extremity: [Left:Warm] [Right:Warm] Capillary Refill: [Left:< 3 seconds] [Right:< 3 seconds] Toe Nail Assessment Left: Right: Thick: Yes Yes Discolored: Yes Yes Deformed: Yes Yes Improper Length and Hygiene: Yes Yes Electronic Signature(s) Signed: 11/19/2014 5:42:15 PM By: Montey Hora Entered By: Montey Hora on 11/19/2014 09:01:43 [name] ([id number]) [name], [name]  ([id number]) -------------------------------------------------------------------------------- Multi Wound Chart Details Patient Name: [name]. Date of Service: 11/19/2014 8:15 AM Medical Record Number: [id number] Patient Account Number: [id number] Date of Birth/Sex: May 05, 1956 (59 y.o. Female) Treating RN: Baruch Gouty, RN, BSN, Velva Harman Primary Care Physician: [name] Other Clinician: Referring Physician: [name] Treating Physician/Extender: BURNS III, WALTER Weeks in Treatment: 60 Vital Signs Height(in): Pulse(bpm): 55 Weight(lbs): Blood Pressure 162/52 (mmHg): Body Mass Index(BMI): Temperature(F): 98.1 Respiratory Rate 18 (breaths/min): Photos: [48:No Photos] [49:No Photos] [50:No Photos] Wound Location: [48:Right, Lateral Lower Leg Left, Medial Lower Leg] [50:Right Lower Leg - Lateral, Proximal] Wounding Event: [48:Blister] [49:Blister] [50:Blister] Primary Etiology: [48:Venous Leg Ulcer] [49:Venous Leg Ulcer] [  50:Venous Leg Ulcer] Comorbid History: [48:N/A] [49:N/A] [50:Lymphedema, Hypertension, Type II Diabetes] Date Acquired: [48:10/29/2014] [49:11/03/2014] [50:11/14/2014] Weeks of Treatment: [48:3] [49:2] [50:0] Wound Status: [48:Open] [49:Open] [50:Open] Measurements L x W x D 0x0x0 [49:0x0x0] [50:5.5x7.4x0.1] (cm) Area (cm) : [48:0] [49:0] [50:31.966] Volume (cm) : [48:0] [49:0] [50:3.197] % Reduction in Area: [48:100.00%] [49:100.00%] [50:0.00%] % Reduction in Volume: 100.00% [49:100.00%] [50:0.00%] Classification: [48:Partial Thickness] [49:Partial Thickness] [50:Partial Thickness] HBO Classification: [48:N/A] [49:N/A] [50:Grade 1] Exudate Amount: [48:N/A] [49:N/A] [50:Large] Exudate Type: [48:N/A] [49:N/A] [50:Serous] Exudate Color: [48:N/A] [49:N/A] [50:amber] Wound Margin: [48:N/A] [49:N/A] [50:Flat and Intact] Granulation Amount: [48:N/A] [49:N/A] [50:Large (67-100%)] Granulation Quality: [48:N/A] [49:N/A] [50:Red] Necrotic Amount: [48:N/A]  [49:N/A] [50:None Present (0%)] Epithelialization: [48:N/A] [49:N/A] [50:None] Periwound Skin Texture: No Abnormalities Noted [49:No Abnormalities Noted] [50:Edema: No Excoriation: No Induration: No] Callus: No Crepitus: No Fluctuance: No Friable: No Rash: No Scarring: No Periwound Skin No Abnormalities Noted No Abnormalities Noted Maceration: No Moisture: Moist: No Dry/Scaly: No Periwound Skin Color: No Abnormalities Noted No Abnormalities Noted Atrophie Blanche: No Cyanosis: No Ecchymosis: No Erythema: No Hemosiderin Staining: No Mottled: No Pallor: No Rubor: No Temperature: N/A N/A No Abnormality Tenderness on No No No Palpation: Wound Preparation: N/A N/A Ulcer Cleansing: Other: soap and water Topical Anesthetic Applied: None Treatment Notes Electronic Signature(s) Signed: 11/19/2014 5:38:29 PM By: Regan Lemming BSN, RN Entered By: Regan Lemming on 11/19/2014 09:46:23 [name] ([id number]) -------------------------------------------------------------------------------- Multi-Disciplinary Care Plan Details Patient Name: [name]. Date of Service: 11/19/2014 8:15 AM Medical Record Number: [id number] Patient Account Number: [id number] Date of Birth/Sex: March 27, 1956 (59 y.o. Female) Treating RN: Afful, RN, BSN, Velva Harman Primary Care Physician: [name] Other Clinician: Referring Physician: [name] Treating Physician/Extender: BURNS III, Charlean Sanfilippo in Treatment: 104 Active Inactive Nutrition Nursing Diagnoses: Impaired glucose control: actual or potential Goals: Patient/caregiver verbalizes understanding of need to maintain therapeutic glucose control per primary care physician Date Initiated: 12/16/2013 Goal Status: Active Interventions: Provide education on elevated blood sugars and impact on wound healing Notes: Soft Tissue Infection Nursing Diagnoses: Impaired tissue integrity Goals: Patient will remain free of wound infection Date  Initiated: 12/16/2013 Goal Status: Active Interventions: Assess signs and symptoms of infection every visit Notes: Venous Leg Ulcer Nursing Diagnoses: Knowledge deficit related to disease process and management Potential for venous Insuffiency (use before diagnosis confirmed) GoalsFLEURETTE, GRAFFEO ([id number]) Patient will maintain optimal edema control Date Initiated: 08/18/2014 Goal Status: Active Interventions: Assess peripheral edema status every visit. Compression as ordered Treatment Activities: Therapeutic compression applied : 11/19/2014 Notes: Electronic Signature(s) Signed: 11/19/2014 5:38:29 PM By: Regan Lemming BSN, RN Entered By: Regan Lemming on 11/19/2014 09:46:12 [name] ([id number]) -------------------------------------------------------------------------------- Patient/Caregiver Education Details Patient Name: [name] Date of Service: 11/19/2014 8:15 AM Medical Record Number: [id number] Patient Account Number: [id number] Date of Birth/Gender: [birth year] (59 y.o. Female) Treating RN: Montey Hora Primary Care Physician: [name] Other Clinician: Referring Physician: [name] Treating Physician/Extender: BURNS III, Charlean Sanfilippo in Treatment: 61 Education Assessment Education Provided To: Patient Education Topics Provided Wound/Skin Impairment: Handouts: Other: wound care for toe wound Methods: Demonstration, Explain/Verbal Responses: State content correctly Electronic Signature(s) Signed: 11/19/2014 12:21:43 PM By: Montey Hora Entered By: Montey Hora on 11/19/2014 12:21:43 [name] ([id number]) -------------------------------------------------------------------------------- Wound Assessment Details Patient Name: [name]. Date of Service: 11/19/2014 8:15 AM Medical Record Number: [id number] Patient Account Number: [id number] Date of Birth/Sex: December 29, 1955 (59 y.o. Female) Treating RN: Montey Hora Primary Care Physician: [name] Other Clinician: Referring Physician: [name] Treating Physician/Extender:  BURNS III, WALTER Weeks in Treatment: 86 Wound Status Wound Number: 48 Primary Etiology: Venous Leg Ulcer Wound Location: Right, Lateral Lower Leg Wound Status: Open Wounding Event: Blister Date Acquired: 10/29/2014 Weeks Of Treatment: 3 Clustered Wound: No Photos Photo Uploaded By: Montey Hora on 11/19/2014 12:35:27 Wound Measurements Length: (cm) 0 % Reduction i Width: (cm) 0 % Reduction i Depth: (cm) 0 Area: (cm) 0 Volume: (cm) 0 n Area: 100% n Volume: 100% Wound Description Classification: Partial Thickness Periwound Skin Texture Texture Color No Abnormalities Noted: No No Abnormalities Noted: No Moisture No Abnormalities Noted: No Electronic Signature(s) Signed: 11/19/2014 5:42:15 PM By: Margarita Grizzle, [name] ([id number]) Entered By: Montey Hora on 11/19/2014 08:55:34 [name] ([id number]) -------------------------------------------------------------------------------- Wound Assessment Details Patient Name: [name]. Date of Service: 11/19/2014 8:15 AM Medical Record Number: [id number] Patient Account Number: [id number] Date of Birth/Sex: 05-22-1956 (59 y.o. Female) Treating RN: Montey Hora Primary Care Physician: [name] Other Clinician: Referring Physician: [name] Treating Physician/Extender: BURNS III, WALTER Weeks in Treatment: 86 Wound Status Wound Number: 49 Primary Etiology: Venous Leg Ulcer Wound Location: Left, Medial Lower Leg Wound Status: Open Wounding Event: Blister Date Acquired: 11/03/2014 Weeks Of Treatment: 2 Clustered Wound: No Photos Photo Uploaded By: Montey Hora on 11/19/2014 12:35:28 Wound Measurements Length: (cm) 0 % Reduction i Width: (cm) 0 % Reduction i Depth: (cm) 0 Area: (cm) 0 Volume: (cm) 0 n Area: 100% n Volume: 100% Wound  Description Classification: Partial Thickness Periwound Skin Texture Texture Color No Abnormalities Noted: No No Abnormalities Noted: No Moisture No Abnormalities Noted: No Electronic Signature(s) Signed: 11/19/2014 5:42:15 PM By: Margarita Grizzle, [name] ([id number]) Entered By: Montey Hora on 11/19/2014 08:55:34 [name] ([id number]) -------------------------------------------------------------------------------- Wound Assessment Details Patient Name: [name]. Date of Service: 11/19/2014 8:15 AM Medical Record Number: [id number] Patient Account Number: [id number] Date of Birth/Sex: May 15, 1956 (59 y.o. Female) Treating RN: Montey Hora Primary Care Physician: [name] Other Clinician: Referring Physician: [name] Treating Physician/Extender: BURNS III, WALTER Weeks in Treatment: 86 Wound Status Wound Number: 67 Primary Venous Leg Ulcer Etiology: Wound Location: Right Lower Leg - Lateral, Proximal Wound Status: Open Wounding Event: Blister Comorbid Lymphedema, Hypertension, Type II History: Diabetes Date Acquired: 11/14/2014 Weeks Of Treatment: 0 Clustered Wound: No Photos Photo Uploaded By: Montey Hora on 11/19/2014 12:35:56 Wound Measurements Length: (cm) 5.5 Width: (cm) 7.4 Depth: (cm) 0.1 Area: (cm) 31.966 Volume: (cm) 3.197 % Reduction in Area: 0% % Reduction in Volume: 0% Epithelialization: None Tunneling: No Undermining: No Wound Description Classification: Partial Thickness Foul Odor A Diabetic Severity (Wagner): Grade 1 Wound Margin: Flat and Intact Exudate Amount: Large Exudate Type: Serous Exudate Color: amber fter Cleansing: No Wound Bed Granulation Amount: Large (67-100%) Exposed Structure Granulation Quality: Red Fascia Exposed: No Tessler, Burgandy K. ([id number]) Necrotic Amount: None Present (0%) Fat Layer Exposed: No Tendon Exposed: No Muscle Exposed: No Joint Exposed: No Bone Exposed:  No Limited to Skin Breakdown Periwound Skin Texture Texture Color No Abnormalities Noted: No No Abnormalities Noted: No Callus: No Atrophie Blanche: No Crepitus: No Cyanosis: No Excoriation: No Ecchymosis: No Fluctuance: No Erythema: No Friable: No Hemosiderin Staining: No Induration: No Mottled: No Localized Edema: No Pallor: No Rash: No Rubor: No Scarring: No Temperature / Pain Moisture Temperature: No Abnormality No Abnormalities Noted: No Dry / Scaly: No Maceration: No Moist: No Wound Preparation Ulcer Cleansing: Other: soap and water, Topical Anesthetic Applied: None Treatment Notes Wound #50 (Right, Proximal, Lateral Lower Leg) 1. Cleansed with:  Cleanse wound with antibacterial soap and water 4. Dressing Applied: Iodoflex 5. Secondary Dressing Applied Non-Adherent pad 7. Secured with 4 Layer Compression System - Bilateral Electronic Signature(s) Signed: 11/19/2014 5:42:15 PM By: Montey Hora Entered By: Montey Hora on 11/19/2014 09:04:24 [name] ([id number]) -------------------------------------------------------------------------------- Wound Assessment Details Patient Name: [name]. Date of Service: 11/19/2014 8:15 AM Medical Record Number: [id number] Patient Account Number: [id number] Date of Birth/Sex: 1956/05/02 (59 y.o. Female) Treating RN: Afful, RN, BSN, [place] Primary Care Physician: [name] Other Clinician: Referring Physician: [name] Treating Physician/Extender: BURNS III, WALTER Weeks in Treatment: 86 Wound Status Wound Number: 22 Primary Diabetic Wound/Ulcer of the Lower Etiology: Extremity Wound Location: Left Toe Great Wound Status: Open Wounding Event: Gradually Appeared Comorbid Lymphedema, Hypertension, Type II Date Acquired: 11/19/2014 History: Diabetes Weeks Of Treatment: 0 Clustered Wound: No Photos Photo Uploaded By: Montey Hora on 11/19/2014 12:35:56 Wound Measurements Length: (cm)  0.3 Width: (cm) 0.3 Depth: (cm) 0.2 Area: (cm) 0.071 Volume: (cm) 0.014 % Reduction in Area: 0% % Reduction in Volume: 0% Epithelialization: Small (1-33%) Tunneling: No Undermining: No Wound Description Classification: Grade 0 Wound Margin: Distinct, outline attached Exudate Amount: Small Exudate Type: Serous Exudate Color: amber Foul Odor After Cleansing: No Wound Bed Granulation Amount: Large (67-100%) Exposed Structure Granulation Quality: Pink, Pale Fascia Exposed: No Necrotic Amount: None Present (0%) Fat Layer Exposed: No Tendon Exposed: No Grothaus, Ceasia K. ([id number]) Muscle Exposed: No Joint Exposed: No Bone Exposed: No Limited to Skin Breakdown Periwound Skin Texture Texture Color No Abnormalities Noted: No No Abnormalities Noted: No Callus: No Atrophie Blanche: No Crepitus: No Cyanosis: No Excoriation: No Ecchymosis: No Fluctuance: No Erythema: No Friable: No Hemosiderin Staining: No Induration: No Mottled: No Localized Edema: Yes Pallor: No Rash: No Rubor: No Scarring: No Temperature / Pain Moisture Temperature: No Abnormality No Abnormalities Noted: No Dry / Scaly: No Maceration: No Moist: Yes Wound Preparation Ulcer Cleansing: Rinsed/Irrigated with Saline Topical Anesthetic Applied: None Treatment Notes Wound #51 (Left Toe Great) 1. Cleansed with: Cleanse wound with antibacterial soap and water 4. Dressing Applied: Prisma Ag 5. Secondary Dressing Applied Kerlix/Conform 7. Secured with Recruitment consultant) Signed: 11/19/2014 5:38:29 PM By: Regan Lemming BSN, RN Entered By: Regan Lemming on 11/19/2014 09:51:52 [name] ([id number]) -------------------------------------------------------------------------------- Vitals Details Patient Name: [name] Date of Service: 11/19/2014 8:15 AM Medical Record Number: [id number] Patient Account Number: [id number] Date of Birth/Sex: 08-Apr-1956 (59 y.o.  Female) Treating RN: Montey Hora Primary Care Physician: [name] Other Clinician: Referring Physician: [name] Treating Physician/Extender: BURNS III, WALTER Weeks in Treatment: 24 Vital Signs Time Taken: 08:52 Temperature (F): 98.1 Pulse (bpm): 55 Respiratory Rate (breaths/min): 18 Blood Pressure (mmHg): 162/52 Reference Range: 80 - 120 mg / dl Electronic Signature(s) Signed: 11/19/2014 5:42:15 PM By: Montey Hora Entered By: Montey Hora on 11/19/2014 08:59:17

## 2014-11-26 ENCOUNTER — Encounter: Payer: Managed Care, Other (non HMO) | Attending: Surgery | Admitting: Surgery

## 2014-11-26 DIAGNOSIS — I890 Lymphedema, not elsewhere classified: Secondary | ICD-10-CM | POA: Diagnosis not present

## 2014-11-26 DIAGNOSIS — E669 Obesity, unspecified: Secondary | ICD-10-CM | POA: Insufficient documentation

## 2014-11-26 DIAGNOSIS — L97219 Non-pressure chronic ulcer of right calf with unspecified severity: Secondary | ICD-10-CM | POA: Insufficient documentation

## 2014-11-26 DIAGNOSIS — B353 Tinea pedis: Secondary | ICD-10-CM | POA: Diagnosis not present

## 2014-11-26 DIAGNOSIS — E119 Type 2 diabetes mellitus without complications: Secondary | ICD-10-CM | POA: Diagnosis not present

## 2014-11-26 DIAGNOSIS — N200 Calculus of kidney: Secondary | ICD-10-CM

## 2014-11-26 HISTORY — DX: Calculus of kidney: N20.0

## 2014-11-27 NOTE — Progress Notes (Signed)
TANITA, CANN ([id number]) Visit Report for 11/26/2014 Arrival Information Details Patient Name: [name]. Date of Service: 11/26/2014 8:00 AM Medical Record Number: [id number] Patient Account Number: [id number] Date of Birth/Sex: [birth year] (59 y.o. Female) Treating RN: Cornell Barman Primary Care Physician: [name] Other Clinician: Referring Physician: [name] Treating Physician/Extender: BURNS, Charlean Sanfilippo in Treatment: 50 Visit Information History Since Last Visit Added or deleted any medications: No Patient Arrived: [name] Any new allergies or adverse reactions: No Arrival Time: 08:09 Had a fall or experienced change in No Accompanied By: self activities of daily living that may affect Transfer Assistance: None risk of falls: Patient Identification Verified: Yes Signs or symptoms of abuse/neglect since last No Secondary Verification Process Yes visito Completed: Hospitalized since last visit: No Patient Requires Transmission- No Has Dressing in Place as Prescribed: Yes Based Precautions: Has Compression in Place as Prescribed: Yes Patient Has Alerts: Yes Pain Present Now: No Patient Alerts: Patient on Blood Thinner Electronic Signature(s) Signed: 11/26/2014 5:03:50 PM By: Gretta Cool, RN, BSN, Kim RN, BSN Entered By: Gretta Cool, RN, BSN, Kim on 11/26/2014 08:10:07 [name] ([id number]) -------------------------------------------------------------------------------- Encounter Discharge Information Details Patient Name: [name]. Date of Service: 11/26/2014 8:00 AM Medical Record Number: [id number] Patient Account Number: [id number] Date of Birth/Sex: 01/21/1956 (59 y.o. Female) Treating RN: Cornell Barman Primary Care Physician: [name] Other Clinician: Referring Physician: [name] Treating Physician/Extender: BURNS, Charlean Sanfilippo in Treatment: 56 Encounter Discharge Information Items Discharge Pain Level: 0 Discharge Condition:  Stable Ambulatory Status: Cane Discharge Destination: Home Transportation: Private Auto Accompanied By: self Schedule Follow-up Appointment: Yes Medication Reconciliation completed Yes and provided to Patient/Care Parminder Trapani: Provided on Clinical Summary of Care: 11/26/2014 Form Type Recipient Paper Patient GI Electronic Signature(s) Signed: 11/26/2014 8:56:47 AM By: Ruthine Dose Entered By: Ruthine Dose on 11/26/2014 08:56:47 [name] ([id number]) -------------------------------------------------------------------------------- Lower Extremity Assessment Details Patient Name: [name] Date of Service: 11/26/2014 8:00 AM Medical Record Number: [id number] Patient Account Number: [id number] Date of Birth/Sex: 01/31/1956 (59 y.o. Female) Treating RN: Cornell Barman Primary Care Physician: [name] Other Clinician: Referring Physician: [name] Treating Physician/Extender: BURNS, Charlean Sanfilippo in Treatment: 87 Edema Assessment Assessed: [Left: No] [Right: No] E[Left: dema] [Right: :] Calf Left: Right: Point of Measurement: 24 cm From Medial Instep 44.6 cm 37.2 cm Ankle Left: Right: Point of Measurement: 8 cm From Medial Instep 24 cm 23.7 cm Vascular Assessment Pulses: Posterior Tibial Dorsalis Pedis Palpable: [Left:Yes] [Right:Yes] Extremity colors, hair growth, and conditions: Extremity Color: [Left:Mottled] [Right:Mottled] Hair Growth on Extremity: [Left:No] [Right:No] Temperature of Extremity: [Left:Warm] [Right:Warm] Capillary Refill: [Left:< 3 seconds] [Right:< 3 seconds] Toe Nail Assessment Left: Right: Thick: Yes Yes Discolored: Yes Yes Deformed: Yes Yes Improper Length and Hygiene: Yes Yes Electronic Signature(s) Signed: 11/26/2014 5:03:50 PM By: Gretta Cool, RN, BSN, Kim RN, BSN Entered By: Gretta Cool, RN, BSN, Kim on 11/26/2014 08:21:59 [name]  ([id number]) -------------------------------------------------------------------------------- Multi Wound Chart Details Patient Name: [name]. Date of Service: 11/26/2014 8:00 AM Medical Record Number: [id number] Patient Account Number: [id number] Date of Birth/Sex: 1956-02-05 (59 y.o. 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] [N/A:N/A] Comorbid History: [50:Lymphedema, Hypertension, Type II Diabetes] [51:Lymphedema, Hypertension, Type II Diabetes] [N/A:N/A] Date Acquired: [50:11/14/2014] [51:11/19/2014] [N/A:N/A] Weeks of Treatment: [50:1] [51:1] [N/A:N/A] Wound Status: [50:Open] [51:Open] [N/A:N/A] Measurements L x W x D 5x6x0.1 [51:N/A] [N/A:N/A] (cm) Area (cm) : [50:23.562] [51:N/A] [N/A:N/A] Volume (cm) : [50:2.356] [51:N/A] [N/A:N/A] % Reduction in Area: [50:26.30%] [51:N/A] [N/A:N/A] % Reduction in Volume: 26.30% [51:N/A] [N/A:N/A] Classification: [50:Partial Thickness] [51:Grade 0] [N/A:N/A] HBO Classification: [50:Grade 1] [51:N/A] [N/A:N/A] Exudate Amount: [50:Large] [51:Small] [N/A:N/A] Exudate Type: [50:Serous] [51:Serous] [N/A:N/A] Exudate Color: [50:amber] [51:amber] [N/A:N/A] Wound Margin: [50:Flat and Intact] [51:Distinct, outline attached] [N/A:N/A] Granulation Amount: [50:Large (67-100%)] [51:Large (67-100%)] [N/A:N/A] Granulation Quality: [50:Red] [51:Pale]  [N/A:N/A] Necrotic Amount: [50:None Present (0%)] [51:None Present (0%)] [N/A:N/A] Exposed Structures: [50:Fascia: No Fat: No Tendon: No] [51:Fascia: No Fat: No Tendon: No] [N/A:N/A] Muscle: No Muscle: No Joint: No Joint: No Bone: No Bone: No Limited to Skin Limited to Skin Breakdown Breakdown Epithelialization: Small (1-33%) Small (1-33%) N/A Periwound Skin Texture: Friable: Yes Callus: Yes N/A Edema: No Edema: No Excoriation: No Excoriation: No Induration: No Induration: No Callus: No Crepitus: No Crepitus: No Fluctuance: No Fluctuance: No Friable: No Rash: No Rash: No Scarring: No Scarring: No Periwound Skin Moist: Yes Dry/Scaly: Yes N/A Moisture: Maceration: No Maceration: No Dry/Scaly: No Moist: No Periwound Skin Color: Atrophie Blanche: No Atrophie Blanche: No N/A Cyanosis: No Cyanosis: No Ecchymosis: No Ecchymosis: No Erythema: No Erythema: No Hemosiderin Staining: No Hemosiderin Staining: No Mottled: No Mottled: No Pallor: No Pallor: No Rubor: No Rubor: No Temperature: No Abnormality No Abnormality N/A Tenderness on No No N/A Palpation: Wound Preparation: Ulcer Cleansing: Other: Ulcer Cleansing: N/A soap and water Rinsed/Irrigated with Saline Topical Anesthetic Applied: None Topical Anesthetic Applied: Other: lidocaine 4% Treatment Notes Electronic Signature(s) Signed: 11/26/2014 5:03:50 PM By: Gretta Cool, RN, BSN, Kim RN, BSN Entered By: Gretta Cool, RN, BSN, Kim on 11/26/2014 08:27:31 [name] ([id number]) -------------------------------------------------------------------------------- Multi-Disciplinary Care Plan Details Patient Name: [name] Date of Service: 11/26/2014 8:00 AM Medical Record Number: [id number] Patient Account Number: [id number] Date of Birth/Sex: March 16, 1956 (59 y.o. Female) Treating RN: Cornell Barman Primary Care Physician: [name] Other Clinician: Referring Physician: [name] Treating  Physician/Extender: BURNS, Charlean Sanfilippo in Treatment: 38 Active Inactive Nutrition Nursing Diagnoses: Impaired glucose control: actual or potential Goals: Patient/caregiver verbalizes understanding of need to maintain therapeutic glucose control per primary care physician Date Initiated: 12/16/2013 Goal Status: Active Interventions: Provide education on elevated blood sugars and impact on wound healing Notes: Soft Tissue Infection Nursing Diagnoses: Impaired tissue integrity Goals: Patient will remain free of wound infection Date Initiated: 12/16/2013 Goal Status: Active Interventions: Assess signs and symptoms of infection every visit Notes: Venous Leg Ulcer Nursing Diagnoses: Knowledge deficit related to disease process and management Potential for venous Insuffiency (use before diagnosis confirmed) GoalsMAKENLEIGH, [name] ([id number]) Patient will maintain optimal edema control Date Initiated: 08/18/2014 Goal Status: Active Interventions: Assess peripheral edema status every visit. Compression as ordered Treatment Activities: Therapeutic compression applied : 11/26/2014 Notes: Electronic Signature(s) Signed: 11/26/2014 5:03:50 PM By: Gretta Cool, RN, BSN, Kim RN, BSN Entered By: Gretta Cool, RN, BSN, Kim on 11/26/2014 08:27:15 [name] ([id number]) -------------------------------------------------------------------------------- Pain Assessment Details Patient Name: [name]. Date of Service: 11/26/2014 8:00 AM Medical Record Number: [id number] Patient Account Number: [id number] Date of Birth/Sex: [birth year] (59 y.o. Female) Treating RN: Cornell Barman Primary Care Physician: [name] Other Clinician: Referring Physician: [name] Treating Physician/Extender: BURNS, Charlean Sanfilippo in Treatment: 65 Active Problems Location  of Pain Severity and Description of Pain Patient Has Paino No Site Locations Pain Management and Medication Current Pain  Management: Electronic Signature(s) Signed: 11/26/2014 5:03:50 PM By: Gretta Cool, RN, BSN, Kim RN, BSN Entered By: Gretta Cool, RN, BSN, Kim on 11/26/2014 08:10:23 [name] ([id number]) -------------------------------------------------------------------------------- Patient/Caregiver Education Details Patient Name: [name] Date of Service: 11/26/2014 8:00 AM Medical Record Number: [id number] Patient Account Number: [id number] Date of Birth/Gender: 06-18-1956 (59 y.o. Female) Treating RN: Cornell Barman Primary Care Physician: [name] Other Clinician: Referring Physician: [name] Treating Physician/Extender: BURNS, Charlean Sanfilippo in Treatment: 54 Education Assessment Education Provided To: Patient Education Topics Provided Venous: Controlling Swelling with Multilayered Compression Wraps, Other: elevate legs above heart Handouts: when sitting Electronic Signature(s) Signed: 11/26/2014 5:03:50 PM By: Gretta Cool, RN, BSN, Kim RN, BSN Entered By: Gretta Cool, RN, BSN, Kim on 11/26/2014 08:53:54 [name] ([id number]) -------------------------------------------------------------------------------- Wound Assessment Details Patient Name: [name] Date of Service: 11/26/2014 8:00 AM Medical Record Number: [id number] Patient Account Number: [id number] Date of Birth/Sex: May 17, 1956 (59 y.o. Female) Treating RN: Cornell Barman Primary Care Physician: [name] Other Clinician: Referring Physician: [name] Treating Physician/Extender: BURNS, Charlean Sanfilippo in Treatment: 87 Wound Status Wound Number: 50 Primary Venous Leg Ulcer Etiology: Wound Location: Right Lower Leg - Lateral, Proximal Wound Status: Open Wounding Event: Blister Comorbid Lymphedema, Hypertension, Type II History: Diabetes Date Acquired: 11/14/2014 Weeks Of Treatment: 1 Clustered Wound: No Photos Photo Uploaded By: Gretta Cool, RN, BSN, Kim on 11/26/2014 09:05:48 Wound Measurements Length: (cm)  5 Width: (cm) 6 Depth: (cm) 0.1 Area: (cm) 23.562 Volume: (cm) 2.356 % Reduction in Area: 26.3% % Reduction in Volume: 26.3% Epithelialization: Small (1-33%) Undermining: No Wound Description Classification: Partial Thickness Foul Odor A Diabetic Severity (Wagner): Grade 1 Wound Margin: Flat and Intact Exudate Amount: Large Exudate Type: Serous Exudate Color: amber fter Cleansing: No Wound Bed Granulation Amount: Large (67-100%) Exposed Structure Granulation Quality: Red Fascia Exposed: No [name], [name] ([id number]) Necrotic Amount: None Present (0%) Fat Layer Exposed: No Tendon Exposed: No Muscle Exposed: No Joint Exposed: No Bone Exposed: No Limited to Skin Breakdown Periwound Skin Texture Texture Color No Abnormalities Noted: No No Abnormalities Noted: No Callus: No Atrophie Blanche: No Crepitus: No Cyanosis: No Excoriation: No Ecchymosis: No Fluctuance: No Erythema: No Friable: Yes Hemosiderin Staining: No Induration: No Mottled: No Localized Edema: No Pallor: No Rash: No Rubor: No Scarring: No Temperature / Pain Moisture Temperature: No Abnormality No Abnormalities Noted: No Dry / Scaly: No Maceration: No Moist: Yes Wound Preparation Ulcer Cleansing: Other: soap and water, Topical Anesthetic Applied: None Treatment Notes Wound #50 (Right, Proximal, Lateral Lower Leg) 1. Cleansed with: Cleanse wound with antibacterial soap and water May shower with protection 2. Anesthetic Topical Lidocaine 4% cream to wound bed prior to debridement 3. Peri-wound Care: Barrier cream 4. Dressing Applied: Iodoflex 5. Secondary Dressing Applied Non-Adherent pad 7. Secured with 4 Layer Compression System - Bilateral Notes [name], [name] ([id number]) Left for edema management, right for wound and edema management, Toe healed. ABD and Kerlix to build up ankles, pink paste below knee to anchor. Electronic Signature(s) Signed: 11/26/2014 5:03:50 PM  By: Gretta Cool, RN, BSN, Kim RN, BSN Entered By: Gretta Cool, RN, BSN, Kim on 11/26/2014 08:25:34 [name] ([id number]) -------------------------------------------------------------------------------- Wound Assessment Details Patient Name: [name] Date of Service: 11/26/2014 8:00 AM Medical Record Number: [id number] Patient Account Number: [id number] Date of Birth/Sex: 09/15/1955 (59 y.o. Female) Treating RN: Cornell Barman Primary Care Physician: [name] Other Clinician: Referring  Physician: [name] Treating Physician/Extender: BURNS, Charlean Sanfilippo in Treatment: 87 Wound Status Wound Number: 51 Primary Diabetic Wound/Ulcer of the Lower Etiology: Extremity Wound Location: Left Toe Great Wound Status: Healed - Epithelialized Wounding Event: Gradually Appeared Comorbid Lymphedema, Hypertension, Type II Date Acquired: 11/19/2014 History: Diabetes Weeks Of Treatment: 1 Clustered Wound: No Photos Photo Uploaded By: Gretta Cool, RN, BSN, Kim on 11/26/2014 09:05:48 Wound Measurements Length: (cm) Width: (cm) Depth: (cm) Area: (cm) Volume: (cm) 0 % Reduction in Area: 100% 0 % Reduction in Volume: 100% 0 Epithelialization: Small (1-33%) 0 0 Wound Description Classification: Grade 0 Wound Margin: Distinct, outline attached Exudate Amount: Small Exudate Type: Serous Exudate Color: amber Foul Odor After Cleansing: No Wound Bed Granulation Amount: Large (67-100%) Exposed Structure Granulation Quality: Pale Fascia Exposed: No Necrotic Amount: None Present (0%) Fat Layer Exposed: No Tendon Exposed: No [name], [name] ([id number]) Muscle Exposed: No Joint Exposed: No Bone Exposed: No Limited to Skin Breakdown Periwound Skin Texture Texture Color No Abnormalities Noted: No No Abnormalities Noted: No Callus: Yes Atrophie Blanche: No Crepitus: No Cyanosis: No Excoriation: No Ecchymosis: No Fluctuance: No Erythema: No Friable: No Hemosiderin Staining:  No Induration: No Mottled: No Localized Edema: No Pallor: No Rash: No Rubor: No Scarring: No Temperature / Pain Moisture Temperature: No Abnormality No Abnormalities Noted: No Dry / Scaly: Yes Maceration: No Moist: No Wound Preparation Ulcer Cleansing: Rinsed/Irrigated with Saline Topical Anesthetic Applied: Other: lidocaine 4%, Electronic Signature(s) Signed: 11/26/2014 5:03:50 PM By: Gretta Cool, RN, BSN, Kim RN, BSN Entered By: Gretta Cool, RN, BSN, Kim on 11/26/2014 08:33:29 [name] ([id number]) -------------------------------------------------------------------------------- Vitals Details Patient Name: [name] Date of Service: 11/26/2014 8:00 AM Medical Record Number: [id number] Patient Account Number: [id number] Date of Birth/Sex: [birth year] (59 y.o. Female) Treating RN: Cornell Barman Primary Care Physician: [name] Other Clinician: Referring Physician: [name] Treating Physician/Extender: BURNS, Charlean Sanfilippo in Treatment: 96 Vital Signs Time Taken: 08:12 Temperature (F): 98.2 Pulse (bpm): 66 Respiratory Rate (breaths/min): 18 Blood Pressure (mmHg): 171/76 Reference Range: 80 - 120 mg / dl Electronic Signature(s) Signed: 11/26/2014 5:03:50 PM By: Gretta Cool, RN, BSN, Kim RN, BSN Entered By: Gretta Cool, RN, BSN, Kim on 11/26/2014 08:15:09

## 2014-11-27 NOTE — Progress Notes (Signed)
KORTNY, SULEMAN ([id number]) Visit Report for 11/26/2014 Chief Complaint Document Details Patient Name: [name], [name]. Date of Service: 11/26/2014 8:00 AM Medical Record Number: [id number] Patient Account Number: [id number] Date of Birth/Sex: May 08, 1956 (59 y.o. Female) Treating RN: Primary Care Physician: [name] Other Clinician: Referring Physician: [name] Treating Physician/Extender: BURNS, Charlean Sanfilippo in Treatment: 25 Information Obtained from: Patient Chief Complaint Bilateral lower extremity phlebolymphedema and ulcerations since May 2014. New L great toe callus and ulcer. Electronic Signature(s) Signed: 11/26/2014 4:26:44 PM By: Loletha Grayer MD Entered By: Loletha Grayer on 11/26/2014 11:09:08 [name] ([id number]) -------------------------------------------------------------------------------- HPI Details Patient Name: [name] Date of Service: 11/26/2014 8:00 AM Medical Record Number: [id number] Patient Account Number: [id number] Date of Birth/Sex: 10-14-1955 (59 y.o. Female) Treating RN: Primary Care Physician: [name] Other Clinician: Referring Physician: [name] Treating Physician/Extender: BURNS, Charlean Sanfilippo in Treatment: 64 History of Present Illness HPI Description: 59yo w/ h/o BLE phlebolymphedema, type 2 DM, and obesity. h/o chronic, recurrent BLE calf ulcers. Tolerating 4 layer compression. Ulcers recently healed, but recurred. Infrequently uses lymphedema pump. Fitted for custom compression stockings. In the past she has developed recurrent ulcers when compression bandages discontinued and juxtalite compression initiated. Developed a L great toe ulcer in May 2016. She returns to clinic today and is without complaints. Mild calf pain. No F/C. Moderate clear drainage from R calf ulcer. No toe pain or drainage. Electronic Signature(s) Signed: 11/26/2014 4:26:44 PM By: Loletha Grayer MD Entered By: Loletha Grayer on 11/26/2014 11:11:13 [name] ([id number]) -------------------------------------------------------------------------------- Callus Pairing Details Patient Name: [name] Date of Service: 11/26/2014 8:00 AM Medical Record Number: [id number] Patient Account Number: [id number] Date of Birth/Sex: 02/15/1956 (59 y.o. Female) Treating RN: Primary Care Physician: [name] Other Clinician: Referring Physician: [name] Treating Physician/Extender: BURNS, Charlean Sanfilippo in Treatment: 87 Procedure Performed for: Wound #51 Left Toe Great Performed By: Physician BURNS, Teressa Senter., MD Notes L great toe callus debrided with bladed curette. No blood loss. No underlying ulcer. Electronic Signature(s) Signed: 11/26/2014 4:26:44 PM By: Loletha Grayer MD Entered By: Loletha Grayer on 11/26/2014 11:08:43 [name] ([id number]) -------------------------------------------------------------------------------- Physical Exam Details Patient Name: [name] Date of Service: 11/26/2014 8:00 AM Medical Record Number: [id number] Patient Account Number: [id number] Date of Birth/Sex: 1955-08-15 (59 y.o. Female) Treating RN: Primary Care Physician: [name] Other Clinician: Referring Physician: [name] Treating Physician/Extender: BURNS, Charlean Sanfilippo in Treatment: 39 Constitutional . Pulse regular. Respirations normal and unlabored. Afebrile. . Notes R lateral calf ulcer. Partial thickness. No cellulitis. Biofilm wiped free with gauze. No debridement required today. Palpable DP bilaterally. L great toe callus sharply debrided. Underlying ulcer is reepithelialized. Electronic Signature(s) Signed: 11/26/2014 4:26:44 PM By: Loletha Grayer MD Entered By: Loletha Grayer on 11/26/2014 11:12:55 [name] ([id number]) -------------------------------------------------------------------------------- Physician Orders  Details Patient Name: [name] Date of Service: 11/26/2014 8:00 AM Medical Record Number: [id number] Patient Account Number: [id number] Date of Birth/Sex: April 15, 1956 (59 y.o. Female) Treating RN: Cornell Barman Primary Care Physician: [name] Other Clinician: Referring Physician: [name] Treating Physician/Extender: BURNS, Charlean Sanfilippo in Treatment: 39 Verbal / Phone Orders: No Diagnosis Coding Wound Cleansing Wound #50 Right,Proximal,Lateral Lower Leg o Cleanse wound with mild soap and water o May shower with protection. o No tub bath. Skin Barriers/Peri-Wound Care Wound #50 Right,Proximal,Lateral Lower Leg o Barrier cream o Moisturizing lotion Primary Wound Dressing Wound #50 Right,Proximal,Lateral Lower Leg o Iodoflex Secondary Dressing o  Conform/Kerlix - to build up ankle Wound #50 Right,Proximal,Lateral Lower Leg o ABD pad Dressing Change Frequency Wound #50 Right,Proximal,Lateral Lower Leg o Change dressing every week Follow-up Appointments Wound #50 Right,Proximal,Lateral Lower Leg o Return Appointment in 1 week. Edema Control Wound #50 Right,Proximal,Lateral Lower Leg o 4 Layer Compression System - Bilateral Electronic Signature(s) Signed: 11/26/2014 4:26:44 PM By: Loletha Grayer MD [name] ([id number]) Signed: 11/26/2014 5:03:50 PM By: Gretta Cool, RN, BSN, Kim RN, BSN Entered By: Gretta Cool, RN, BSN, Kim on 11/26/2014 08:35:49 [name] ([id number]) -------------------------------------------------------------------------------- Problem List Details Patient Name: [name], [name]. Date of Service: 11/26/2014 8:00 AM Medical Record Number: [id number] Patient Account Number: [id number] Date of Birth/Sex: 02/06/1956 (59 y.o. Female) Treating RN: Primary Care Physician: [name] Other Clinician: Referring Physician: [name] Treating Physician/Extender: BURNS, Charlean Sanfilippo in Treatment: 17 Active  Problems ICD-9 Encounter Code Description Active Date Diagnosis 457.1 Other Lymphedema - excludes congenital, eyelid or vulva 03/27/2013 Yes 459.33 Chronic venous hypertension with ulcer and inflammation 03/27/2013 Yes 707.12 Ulcer of lower limbs, except pressure ulcer; Ulcer of calf 03/27/2013 Yes ICD-10 Encounter Code Description Active Date Diagnosis I89.0 Lymphedema, not elsewhere classified 03/26/2014 Yes E66.9 Obesity, unspecified 03/26/2014 Yes B35.3 Tinea pedis 08/01/2014 Yes 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 ([id number]) Resolved Problems ICD-10 Code Description Active Date Resolved Date I83.222 Varicose veins of left lower extremity with both ulcer of 11/12/2014 11/12/2014 calf and inflammation Electronic Signature(s) Signed: 11/26/2014 4:26:44 PM By: Loletha Grayer MD Entered By: Loletha Grayer on 11/26/2014 11:07:48 [name] ([id number]) -------------------------------------------------------------------------------- Progress Note Details Patient Name: [name]. Date of Service: 11/26/2014 8:00 AM Medical Record Number: [id number] Patient Account Number: [id number] Date of Birth/Sex: February 23, 1956 (59 y.o. Female) Treating RN: Primary Care Physician: [name] Other Clinician: Referring Physician: [name] Treating Physician/Extender: BURNS, Charlean Sanfilippo in Treatment: 19 Subjective Chief Complaint Information obtained from Patient Bilateral lower extremity phlebolymphedema and ulcerations since May 2014. New L great toe callus and ulcer. History of Present Illness (HPI) 59yo w/ h/o BLE phlebolymphedema, type 2 DM, and obesity. h/o chronic, recurrent BLE calf ulcers. Tolerating 4 layer compression. Ulcers recently healed, but recurred. Infrequently uses  lymphedema pump. Fitted for custom compression stockings. In the past she has developed recurrent ulcers when compression bandages discontinued and juxtalite compression initiated. Developed a L great toe ulcer in May 2016. She returns to clinic today and is without complaints. Mild calf pain. No F/C. Moderate clear drainage from R calf ulcer. No toe pain or drainage. Objective Constitutional Pulse regular. Respirations normal and unlabored. Afebrile. Vitals Time Taken: 8:12 AM, Temperature: 98.2 F, Pulse: 66 bpm, Respiratory Rate: 18 breaths/min, Blood Pressure: 171/76 mmHg. General Notes: R lateral calf ulcer. Partial thickness. No cellulitis. Biofilm wiped free with gauze. No debridement required today. Palpable DP bilaterally. L great toe callus sharply debrided. Underlying ulcer is reepithelialized. Integumentary (Hair, Skin) Wound #50 status is Open. Original cause of wound was Blister. The wound is located on the Right,Proximal,Lateral Lower Leg. The wound measures 5cm length x 6cm width x 0.1cm depth; 23.562cm^2 area and 2.356cm^3 volume. The wound is limited to skin breakdown. There is no undermining [name], [name] ([id number]) noted. There is a large amount of serous drainage noted. The wound margin is flat and intact. There is large (67-100%) red granulation within the wound  bed. There is no necrotic tissue within the wound bed. The periwound skin appearance exhibited: Friable, Moist. The periwound skin appearance did not exhibit: Callus, Crepitus, Excoriation, Fluctuance, Induration, Localized Edema, Rash, Scarring, Dry/Scaly, Maceration, Atrophie Blanche, Cyanosis, Ecchymosis, Hemosiderin Staining, Mottled, Pallor, Rubor, Erythema. Periwound temperature was noted as No Abnormality. Wound #51 status is Healed - Epithelialized. Original cause of wound was Gradually Appeared. The wound is located on the Left Toe Great. The wound measures 0cm length x 0cm width x 0cm depth;  0cm^2 area and 0cm^3 volume. The wound is limited to skin breakdown. There is a small amount of serous drainage noted. The wound margin is distinct with the outline attached to the wound base. There is large (67-100%) pale granulation within the wound bed. There is no necrotic tissue within the wound bed. The periwound skin appearance exhibited: Callus, Dry/Scaly. The periwound skin appearance did not exhibit: Crepitus, Excoriation, Fluctuance, Friable, Induration, Localized Edema, Rash, Scarring, Maceration, Moist, Atrophie Blanche, Cyanosis, Ecchymosis, Hemosiderin Staining, Mottled, Pallor, Rubor, Erythema. Periwound temperature was noted as No Abnormality. 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 Left great toe callus, ulceration re-epithelialized. Procedures Wound #51 Wound #51 is a Diabetic Wound/Ulcer of the Lower Extremity located on the Left Toe Great . An Callus [name], [name] ([id number]) Pairing procedure was performed by BURNS, Teressa Senter., MD. Notes: L great toe callus debrided with bladed curette. No blood loss. No underlying ulcer. Plan Wound Cleansing: Wound #50 Right,Proximal,Lateral Lower Leg: Cleanse wound with mild soap and water May shower with protection. No tub bath. Skin Barriers/Peri-Wound Care: Wound #50 Right,Proximal,Lateral Lower Leg: Barrier cream Moisturizing lotion Primary Wound Dressing: Wound #50 Right,Proximal,Lateral Lower  Leg: Iodoflex Secondary Dressing: Conform/Kerlix - to build up ankle Wound #50 Right,Proximal,Lateral Lower Leg: ABD pad Dressing Change Frequency: Wound #50 Right,Proximal,Lateral Lower Leg: Change dressing every week Follow-up Appointments: Wound #50 Right,Proximal,Lateral Lower Leg: Return Appointment in 1 week. Edema Control: Wound #50 Right,Proximal,Lateral Lower Leg: 4 Layer Compression System - Bilateral Cadexomer iodine and 4-layer compression. Awaiting compression stockings. Continue offloading measures for left great toe. Electronic Signature(s) Signed: 11/26/2014 4:26:44 PM By: Loletha Grayer MD Entered By: Loletha Grayer on 11/26/2014 11:14:33 [name] ([name] ([id number]) -------------------------------------------------------------------------------- SuperBill Details Patient Name: [name] Date of Service: 11/26/2014 Medical Record Number: [id number] Patient Account Number: [id number] Date of Birth/Sex: 06-Jun-1956 (59 y.o. 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 Physician Procedures CPT4 Code: [id number] MICKELLE, ROLOFF Description: [id number] - WC PHYS LEVEL 2 - EST PT ICD-10 Description Diagnosis I83.012 Varicose veins of right lower extremity with ulcer N K. ([id number]) Modifier: of calf Quantity: 1 Electronic Signature(s) Signed: 11/26/2014 4:26:44 PM By: Loletha Grayer MD Entered By: Loletha Grayer on 11/26/2014 11:15:16

## 2014-12-03 ENCOUNTER — Encounter: Payer: Managed Care, Other (non HMO) | Admitting: Surgery

## 2014-12-03 DIAGNOSIS — L97219 Non-pressure chronic ulcer of right calf with unspecified severity: Secondary | ICD-10-CM | POA: Diagnosis not present

## 2014-12-03 NOTE — Progress Notes (Signed)
BRANDEIS, DIMEO ([id number]) Visit Report for 12/03/2014 Chief Complaint Document Details Patient Name: [name], [name]. Date of Service: 12/03/2014 8:00 AM Medical Record Number: [id number] Patient Account Number: [id number] Date of Birth/Sex: September 27, 1955 (59 y.o. 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 Bilateral great toe callus. Electronic Signature(s) Signed: 12/03/2014 3:19:31 PM By: Loletha Grayer MD Entered By: Loletha Grayer on 12/03/2014 13:54:25 [name] ([id number]) -------------------------------------------------------------------------------- HPI Details Patient Name: [name] Date of Service: 12/03/2014 8:00 AM Medical Record Number: [id number] Patient Account Number: [id number] Date of Birth/Sex: Jun 03, 1956 (59 y.o. Female) Treating RN: Primary Care Physician: [name] Other Clinician: Referring Physician: [name] Treating Physician/Extender: BURNS III, WALTER Weeks in Treatment: 51 History of Present Illness HPI Description: 59yo w/ h/o BLE phlebolymphedema, type 2 DM, and obesity. h/o chronic, recurrent BLE calf ulcers. Tolerating 4 layer compression. Ulcers recently healed, but recurred. Infrequently uses lymphedema pump. Fitted for custom compression stockings. In the past she has developed recurrent ulcers when compression bandages discontinued and juxtalite compression initiated. Developed a L great toe ulcer in May 2016, which has healed. She returns to clinic today and c/o B great toe callus. Mild calf pain. No F/C. Moderate clear drainage from R calf ulcer. No toe pain or drainage. Electronic Signature(s) Signed: 12/03/2014 3:19:31 PM By: Loletha Grayer  MD Entered By: Loletha Grayer on 12/03/2014 13:54:58 [name] ([id number]) -------------------------------------------------------------------------------- Physical Exam Details Patient Name: [name] Date of Service: 12/03/2014 8:00 AM Medical Record Number: [id number] Patient Account Number: [id number] Date of Birth/Sex: 02-Aug-1955 (59 y.o. Female) Treating RN: Primary Care Physician: [name] Other Clinician: Referring Physician: [name] Treating Physician/Extender: BURNS III, WALTER Weeks in Treatment: 88 Constitutional . Pulse regular. Respirations normal and unlabored. Afebrile. Marland Kitchen Respiratory WNL. No retractions.. Cardiovascular Pedal Pulses WNL. Integumentary (Hair, Skin) .Marland Kitchen Neurological . Psychiatric Judgement and insight Intact.. Oriented times 3.. No evidence of depression, anxiety, or agitation.. Notes R lateral calf ulcer. Partial thickness. No cellulitis. Biofilm wiped free with gauze. No debridement required today. Palpable DP bilaterally. Bilateral great toe callus sharply debrided. No underlying ulcers. Electronic Signature(s) Signed: 12/03/2014 3:19:31 PM By: Loletha Grayer MD Entered By: Loletha Grayer on 12/03/2014 13:55:53 [name] ([id number]) -------------------------------------------------------------------------------- Physician Orders Details Patient Name: [name] Date of Service: 12/03/2014 8:00 AM Medical Record Number: [id number] Patient Account Number: [id number] Date of Birth/Sex: 1956/06/05 (59 y.o. Female) Treating RN: Baruch Gouty, RN, BSN, Velva Harman Primary Care Physician: [name] Other Clinician: Referring Physician: [name] Treating Physician/Extender: BURNS III, Charlean Sanfilippo in Treatment: 37 Verbal / Phone Orders: Yes Clinician: Afful, RN, BSN, Rita Read Back and Verified: Yes Diagnosis Coding Wound Cleansing Wound #50 Right,Proximal,Lateral Lower Leg o Cleanse wound with  mild soap and water o May shower with protection. o No tub bath. Skin Barriers/Peri-Wound Care Wound #50 Right,Proximal,Lateral Lower Leg o Barrier cream o Moisturizing lotion Primary Wound Dressing Wound #50 Right,Proximal,Lateral Lower Leg o Iodoflex Secondary Dressing o Conform/Kerlix - to build up ankle Wound #50 Right,Proximal,Lateral Lower Leg o ABD pad Dressing Change Frequency Wound #50 Right,Proximal,Lateral Lower Leg o Change dressing every week Follow-up Appointments Wound #50 Right,Proximal,Lateral Lower Leg o Return Appointment in 1 week. Edema Control Wound #50 Right,Proximal,Lateral Lower Leg o 4 Layer Compression System - Bilateral Electronic Signature(s) Signed: 12/03/2014  3:19:31 PM By: Loletha Grayer MD [name] ([id number]) Signed: 12/03/2014 4:29:34 PM By: Regan Lemming BSN, RN Entered By: Regan Lemming on 12/03/2014 08:49:11 [name] ([id number]) -------------------------------------------------------------------------------- Problem List Details Patient Name: [name]. Date of Service: 12/03/2014 8:00 AM Medical Record Number: [id number] Patient Account Number: [id number] Date of Birth/Sex: [birth year] (59 y.o. Female) Treating RN: Primary Care Physician: [name] Other Clinician: Referring Physician: [name] Treating Physician/Extender: BURNS III, Charlean Sanfilippo in Treatment: 71 Active Problems ICD-9 Encounter Code Description Active Date Diagnosis 457.1 Other Lymphedema - excludes congenital, eyelid or vulva 03/27/2013 Yes 459.33 Chronic venous hypertension with ulcer and inflammation 03/27/2013 Yes 707.12 Ulcer of lower limbs, except pressure ulcer; Ulcer of calf 03/27/2013 Yes ICD-10 Encounter Code Description Active Date Diagnosis I89.0 Lymphedema, not elsewhere classified 03/26/2014 Yes E66.9 Obesity, unspecified 03/26/2014 Yes 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 08/27/2014 Yes E11.621 Type 2 diabetes mellitus with foot ulcer 10/08/2014 Yes L84 Corns and callosities 11/12/2014 Yes Inactive Problems Resolved Problems ICD-10 Code Description Active Date Resolved Date AINHARA, BURRI ([id number]) I83.222 Varicose veins of left lower extremity with both ulcer of 11/12/2014 11/12/2014 calf and inflammation Electronic Signature(s) Signed: 12/03/2014 3:19:31 PM By: Loletha Grayer MD Entered By: Loletha Grayer on 12/03/2014 13:58:04 [name] ([id number]) -------------------------------------------------------------------------------- Progress Note Details Patient Name: [name]. Date of Service: 12/03/2014 8:00 AM Medical Record Number: [id number] Patient Account Number: [id number] Date of Birth/Sex: 1955-11-10 (59 y.o. Female) Treating RN: Primary Care Physician: [name] Other Clinician: Referring Physician: [name] Treating Physician/Extender: BURNS III, Charlean Sanfilippo in Treatment: 34 Subjective Chief Complaint Information obtained from Patient Bilateral lower extremity phlebolymphedema and ulcerations since May 2014. Bilateral great toe callus. History of Present Illness (HPI) 59yo w/ h/o BLE phlebolymphedema, type 2 DM, and obesity. h/o chronic, recurrent BLE calf ulcers. Tolerating 4 layer compression. Ulcers recently healed, but recurred. Infrequently uses lymphedema pump. Fitted for custom compression stockings. In the past she has developed recurrent ulcers when compression bandages discontinued and juxtalite compression initiated. Developed a L great toe ulcer in May 2016, which has healed. She returns to clinic today and c/o B great toe callus. Mild calf pain. No F/C. Moderate clear drainage from R calf ulcer. No toe pain or drainage. Objective Constitutional Pulse regular. Respirations normal and unlabored. Afebrile. Vitals Time Taken: 8:37 AM, Temperature: 98.2 F, Pulse: 65 bpm, Respiratory  Rate: 18 breaths/min, Blood Pressure: 155/57 mmHg. Respiratory WNL. No retractions.. Cardiovascular Pedal Pulses WNL. Psychiatric Judgement and insight Intact.. Oriented times 3.. No evidence of depression, anxiety, or agitation.[name] ([id number]) General Notes: R lateral calf ulcer. Partial thickness. No cellulitis. Biofilm wiped free with gauze. No debridement required today. Palpable DP bilaterally. Bilateral great toe callus sharply debrided. No underlying ulcers. Integumentary (Hair, Skin) Wound #50 status is Open. Original cause of wound was Blister. The wound is located on the Right,Proximal,Lateral Lower Leg. The wound measures 4.5cm length x 5.5cm width x 0.1cm depth; 19.439cm^2 area and 1.944cm^3 volume. The wound is limited to skin breakdown. There is no tunneling or undermining noted. There is a large amount of serous drainage noted. The wound margin is flat and intact. There is large (67-100%) red granulation within the wound bed. There is no necrotic tissue within the wound bed. The periwound skin appearance exhibited: Friable, Moist. The periwound skin appearance did not exhibit: Callus, Crepitus, Excoriation, Fluctuance, Induration, Localized Edema, Rash, Scarring, Dry/Scaly, Maceration, Atrophie Blanche, Cyanosis, Ecchymosis,  Hemosiderin Staining, Mottled, Pallor, Rubor, Erythema. Periwound temperature was noted as No Abnormality. 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  Bilateral great toe callus formation. Plan Wound Cleansing: Wound #50 Right,Proximal,Lateral Lower Leg: Cleanse wound with mild soap and water May shower with protection. No tub bath. AILY, GOTTO ([id number]) Skin Barriers/Peri-Wound Care: Wound #50 Right,Proximal,Lateral Lower Leg: Barrier cream Moisturizing lotion Primary Wound Dressing: Wound #50 Right,Proximal,Lateral Lower Leg: Iodoflex Secondary Dressing: Conform/Kerlix - to build up ankle Wound #50 Right,Proximal,Lateral Lower Leg: ABD pad Dressing Change Frequency: Wound #50 Right,Proximal,Lateral Lower Leg: Change dressing every week Follow-up Appointments: Wound #50 Right,Proximal,Lateral Lower Leg: Return Appointment in 1 week. Edema Control: Wound #50 Right,Proximal,Lateral Lower Leg: 4 Layer Compression System - Bilateral Iodoflex and 4-layer compression. Awaiting custom fit compression stockings. Offloading recommendations given. Electronic Signature(s) Signed: 12/03/2014 3:19:31 PM By: Loletha Grayer MD Entered By: Loletha Grayer on 12/03/2014 13:58:22 [name] ([id number]) -------------------------------------------------------------------------------- SuperBill Details Patient Name: [name] Date of Service: 12/03/2014 Medical Record Number: [id number] Patient Account Number: [id number] Date of Birth/Sex: 01/08/1956 (59 y.o. 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 CPT4: [id number] - PARE BENIGN LES; SGL 1 ICD-10 Description Diagnosis L84 Corns and callosities Physician Procedures CPT4 Code: [id number] Suzie Portela Description: [id number] - WC PHYS PARE BENIGN LES; SGL ICD-10 Description Diagnosis L84 Corns and callosities N K. ([id number]) Modifier: Quantity: 1 Electronic Signature(s) Signed: 12/03/2014 3:19:31 PM By: Loletha Grayer MD Entered By: Loletha Grayer on 12/03/2014 13:57:44

## 2014-12-03 NOTE — Progress Notes (Signed)
PRAPTI, FLOW ([id number]) Visit Report for 12/03/2014 Arrival Information Details Patient Name: [name], [name]. Date of Service: 12/03/2014 8:00 AM Medical Record Number: [id number] Patient Account Number: [id number] Date of Birth/Sex: 03-Oct-1955 (59 y.o. Female) Treating RN: Afful, RN, BSN, Velva Harman Primary Care Physician: [name] Other Clinician: Referring Physician: [name] Treating Physician/Extender: BURNS III, Charlean Sanfilippo in Treatment: 4 Visit Information History Since Last Visit Added or deleted any medications: No Patient Arrived: Cane Any new allergies or adverse reactions: No Arrival Time: 08:32 Had a fall or experienced change in No Accompanied By: self activities of daily living that may affect Transfer Assistance: None risk of falls: Patient Identification Verified: Yes Signs or symptoms of abuse/neglect since last No Secondary Verification Process Yes visito Completed: Hospitalized since last visit: No Patient Requires Transmission- No Has Dressing in Place as Prescribed: Yes Based Precautions: Pain Present Now: No Patient Has Alerts: Yes Patient Alerts: Patient on Blood Thinner Electronic Signature(s) Signed: 12/03/2014 4:29:34 PM By: Regan Lemming BSN, RN Entered By: Regan Lemming on 12/03/2014 08:32:47 [name] ([id number]) -------------------------------------------------------------------------------- Clinic Level of Care Assessment Details Patient Name: [name]. Date of Service: 12/03/2014 8:00 AM Medical Record Number: [id number] Patient Account Number: [id number] Date of Birth/Sex: February 14, 1956 (59 y.o. Female) Treating RN: Afful, RN, BSN, Velva Harman Primary Care Physician: [name] Other Clinician: Referring Physician: [name] Treating Physician/Extender: BURNS III, Charlean Sanfilippo in Treatment: 40 Clinic Level of Care Assessment Items TOOL 4 Quantity Score []  - Use when only an EandM is performed on FOLLOW-UP visit  0 ASSESSMENTS - Nursing Assessment / Reassessment []  - Reassessment of Co-morbidities (includes updates in patient status) 0 []  - Reassessment of Adherence to Treatment Plan 0 ASSESSMENTS - Wound and Skin Assessment / Reassessment X - Simple Wound Assessment / Reassessment - one wound 1 5 []  - Complex Wound Assessment / Reassessment - multiple wounds 0 []  - Dermatologic / Skin Assessment (not related to wound area) 0 ASSESSMENTS - Focused Assessment X - Circumferential Edema Measurements - multi extremities 1 5 []  - Nutritional Assessment / Counseling / Intervention 0 X - Lower Extremity Assessment (monofilament, tuning fork, pulses) 1 5 []  - Peripheral Arterial Disease Assessment (using hand held doppler) 0 ASSESSMENTS - Ostomy and/or Continence Assessment and Care []  - Incontinence Assessment and Management 0 []  - Ostomy Care Assessment and Management (repouching, etc.) 0 PROCESS - Coordination of Care X - Simple Patient / Family Education for ongoing care 1 15 []  - Complex (extensive) Patient / Family Education for ongoing care 0 []  - Staff obtains Programmer, systems, Records, Test Results / Process Orders 0 []  - Staff telephones HHA, Nursing Homes / Clarify orders / etc 0 []  - Routine Transfer to another Facility (non-emergent condition) 0 Ziska, Tarisa K. ([id number]) []  - Routine Hospital Admission (non-emergent condition) 0 []  - New Admissions / Biomedical engineer / Ordering NPWT, Apligraf, etc. 0 []  - Emergency Hospital Admission (emergent condition) 0 []  - Simple Discharge Coordination 0 []  - Complex (extensive) Discharge Coordination 0 PROCESS - Special Needs []  - Pediatric / Minor Patient Management 0 []  - Isolation Patient Management 0 []  - Hearing / Language / Visual special needs 0 []  - Assessment of Community assistance (transportation, D/C planning, etc.) 0 []  - Additional assistance / Altered mentation 0 []  - Support Surface(s) Assessment (bed, cushion, seat, etc.)  0 INTERVENTIONS - Wound Cleansing / Measurement X - Simple Wound Cleansing - one wound 1 5 []  - Complex Wound Cleansing - multiple wounds 0 X -  Wound Imaging (photographs - any number of wounds) 1 5 []  - Wound Tracing (instead of photographs) 0 []  - Simple Wound Measurement - one wound 0 []  - Complex Wound Measurement - multiple wounds 0 INTERVENTIONS - Wound Dressings X - Small Wound Dressing one or multiple wounds 1 10 []  - Medium Wound Dressing one or multiple wounds 0 []  - Large Wound Dressing one or multiple wounds 0 []  - Application of Medications - topical 0 []  - Application of Medications - injection 0 INTERVENTIONS - Miscellaneous []  - External ear exam 0 Mcclafferty, Akhila K. ([id number]) []  - Specimen Collection (cultures, biopsies, blood, body fluids, etc.) 0 []  - Specimen(s) / Culture(s) sent or taken to Lab for analysis 0 []  - Patient Transfer (multiple staff / Harrel Lemon Lift / Similar devices) 0 []  - Simple Staple / Suture removal (25 or less) 0 []  - Complex Staple / Suture removal (26 or more) 0 []  - Hypo / Hyperglycemic Management (close monitor of Blood Glucose) 0 []  - Ankle / Brachial Index (ABI) - do not check if billed separately 0 X - Vital Signs 1 5 Has the patient been seen at the hospital within the last three years: Yes Total Score: 55 Level Of Care: New/Established - Level 2 Electronic Signature(s) Signed: 12/03/2014 4:29:34 PM By: Regan Lemming BSN, RN Entered By: Regan Lemming on 12/03/2014 10:00:16 [name] ([id number]) -------------------------------------------------------------------------------- Encounter Discharge Information Details Patient Name: [name]. Date of Service: 12/03/2014 8:00 AM Medical Record Number: [id number] Patient Account Number: [id number] Date of Birth/Sex: [birth year] (59 y.o. Female) Treating RN: Baruch Gouty, RN, BSN, Velva Harman Primary Care Physician: [name] Other Clinician: Referring Physician: [name] Treating  Physician/Extender: BURNS III, Charlean Sanfilippo in Treatment: 31 Encounter Discharge Information Items Discharge Pain Level: 0 Discharge Condition: Stable Ambulatory Status: Cane Discharge Destination: Home Transportation: Private Auto Accompanied By: self Schedule Follow-up Appointment: No Medication Reconciliation completed No and provided to Patient/Care Kresta Templeman: Provided on Clinical Summary of Care: 12/03/2014 Form Type Recipient Paper Patient GI Electronic Signature(s) Signed: 12/03/2014 4:29:34 PM By: Regan Lemming BSN, RN Previous Signature: 12/03/2014 9:20:07 AM Version By: Ruthine Dose Entered By: Regan Lemming on 12/03/2014 09:39:53 [name] ([id number]) -------------------------------------------------------------------------------- Lower Extremity Assessment Details Patient Name: [name]. Date of Service: 12/03/2014 8:00 AM Medical Record Number: [id number] Patient Account Number: [id number] Date of Birth/Sex: May 07, 1956 (59 y.o. Female) Treating RN: Afful, RN, BSN, Velva Harman Primary Care Physician: [name] Other Clinician: Referring Physician: [name] Treating Physician/Extender: BURNS III, Charlean Sanfilippo in Treatment: 88 Edema Assessment Assessed: [Left: No] [Right: No] E[Left: dema] [Right: :] Calf Left: Right: Point of Measurement: 24 cm From Medial Instep 52.6 cm 38 cm Ankle Left: Right: Point of Measurement: 8 cm From Medial Instep 23.9 cm 23 cm Vascular Assessment Claudication: Claudication Assessment [Left:None] [Right:None] Pulses: Posterior Tibial Dorsalis Pedis Palpable: [Left:Yes] [Right:Yes] Extremity colors, hair growth, and conditions: Extremity Color: [Left:Mottled] [Right:Mottled] Hair Growth on Extremity: [Left:No] Temperature of Extremity: [Left:Warm] [Right:Warm] Capillary Refill: [Left:< 3 seconds] [Right:< 3 seconds] Dependent Rubor: [Left:No] [Right:No] Blanched when Elevated: [Left:No]  [Right:No] Lipodermatosclerosis: [Left:No] [Right:No] Toe Nail Assessment Left: Right: Thick: Yes Yes Discolored: Yes Yes Deformed: Yes Yes Improper Length and Hygiene: Yes Yes [facility], [place] ([id number]) Electronic Signature(s) Signed: 12/03/2014 4:29:34 PM By: Regan Lemming BSN, RN Entered By: Regan Lemming on 12/03/2014 08:41:03 [name] ([id number]) -------------------------------------------------------------------------------- Multi Wound Chart Details Patient Name: [name]. Date of Service: 12/03/2014 8:00 AM Medical Record Number: [id number] Patient Account Number: [id number]  Date of Birth/Sex: 1955/08/07 (59 y.o. Female) Treating RN: Baruch Gouty, RN, BSN, Velva Harman Primary Care Physician: [name] Other Clinician: Referring Physician: [name] Treating Physician/Extender: BURNS III, WALTER Weeks in Treatment: 34 Vital Signs Height(in): Pulse(bpm): 65 Weight(lbs): Blood Pressure 155/57 (mmHg): Body Mass Index(BMI): Temperature(F): 98.2 Respiratory Rate 18 (breaths/min): Photos: [50:No Photos] [N/A:N/A] Wound Location: [50:Right Lower Leg - Lateral, Proximal] [N/A:N/A] Wounding Event: [50:Blister] [N/A:N/A] Primary Etiology: [50:Venous Leg Ulcer] [N/A:N/A] Comorbid History: [50:Lymphedema, Hypertension, Type II Diabetes] [N/A:N/A] Date Acquired: [50:11/14/2014] [N/A:N/A] Weeks of Treatment: [50:2] [N/A:N/A] Wound Status: [50:Open] [N/A:N/A] Measurements L x W x D 4.5x5.5x0.1 [N/A:N/A] (cm) Area (cm) : [50:19.439] [N/A:N/A] Volume (cm) : [50:1.944] [N/A:N/A] % Reduction in Area: [50:39.20%] [N/A:N/A] % Reduction in Volume: 39.20% [N/A:N/A] Classification: [50:Partial Thickness] [N/A:N/A] HBO Classification: [50:Grade 1] [N/A:N/A] Exudate Amount: [50:Large] [N/A:N/A] Exudate Type: [50:Serous] [N/A:N/A] Exudate Color: [50:amber] [N/A:N/A] Wound Margin: [50:Flat and Intact] [N/A:N/A] Granulation Amount: [50:Large (67-100%)]  [N/A:N/A] Granulation Quality: [50:Red] [N/A:N/A] Necrotic Amount: [50:None Present (0%)] [N/A:N/A] Exposed Structures: [50:Fascia: No Fat: No Tendon: No Muscle: No] [N/A:N/A] Joint: No Bone: No Limited to Skin Breakdown Epithelialization: Small (1-33%) N/A N/A Periwound Skin Texture: Friable: Yes N/A N/A Edema: No Excoriation: No Induration: No Callus: No Crepitus: No Fluctuance: No Rash: No Scarring: No Periwound Skin Moist: Yes N/A N/A Moisture: Maceration: No Dry/Scaly: No Periwound Skin Color: Atrophie Blanche: No N/A N/A Cyanosis: No Ecchymosis: No Erythema: No Hemosiderin Staining: No Mottled: No Pallor: No Rubor: No Temperature: No Abnormality N/A N/A Tenderness on No N/A N/A Palpation: Wound Preparation: Ulcer Cleansing: Other: N/A N/A soap and water Topical Anesthetic Applied: None Treatment Notes Electronic Signature(s) Signed: 12/03/2014 4:29:34 PM By: Regan Lemming BSN, RN Entered By: Regan Lemming on 12/03/2014 08:48:06 [name] ([id number]) -------------------------------------------------------------------------------- Multi-Disciplinary Care Plan Details Patient Name: [name]. Date of Service: 12/03/2014 8:00 AM Medical Record Number: [id number] Patient Account Number: [id number] Date of Birth/Sex: 1955/11/18 (59 y.o. Female) Treating RN: Afful, RN, BSN, Velva Harman Primary Care Physician: [name] Other Clinician: Referring Physician: [name] Treating Physician/Extender: BURNS III, Charlean Sanfilippo in Treatment: 65 Active Inactive Nutrition Nursing Diagnoses: Impaired glucose control: actual or potential Goals: Patient/caregiver verbalizes understanding of need to maintain therapeutic glucose control per primary care physician Date Initiated: 12/16/2013 Goal Status: Active Interventions: Provide education on elevated blood sugars and impact on wound healing Notes: Soft Tissue Infection Nursing Diagnoses: Impaired tissue  integrity Goals: Patient will remain free of wound infection Date Initiated: 12/16/2013 Goal Status: Active Interventions: Assess signs and symptoms of infection every visit Notes: Venous Leg Ulcer Nursing Diagnoses: Knowledge deficit related to disease process and management Potential for venous Insuffiency (use before diagnosis confirmed) GoalsSHENE, JERDEE ([id number]) Patient will maintain optimal edema control Date Initiated: 08/18/2014 Goal Status: Active Interventions: Assess peripheral edema status every visit. Compression as ordered Treatment Activities: Therapeutic compression applied : 12/03/2014 Notes: Electronic Signature(s) Signed: 12/03/2014 4:29:34 PM By: Regan Lemming BSN, RN Entered By: Regan Lemming on 12/03/2014 08:47:01 [name] ([id number]) -------------------------------------------------------------------------------- Pain Assessment Details Patient Name: [name]. Date of Service: 12/03/2014 8:00 AM Medical Record Number: [id number] Patient Account Number: [id number] Date of Birth/Sex: 07/11/1955 (59 y.o. Female) Treating RN: Baruch Gouty, RN, BSN, Velva Harman Primary Care Physician: [name] Other Clinician: Referring Physician: [name] Treating Physician/Extender: BURNS III, Charlean Sanfilippo in Treatment: 60 Active Problems Location of Pain Severity and Description of Pain Patient Has Paino No Site Locations Pain Management and Medication Current Pain Management: Electronic Signature(s) Signed: 12/03/2014 4:29:34 PM By: Baruch Gouty,  Velva Harman BSN, RN Entered By: Regan Lemming on 12/03/2014 08:37:34 [name] ([id number]) -------------------------------------------------------------------------------- Patient/Caregiver Education Details Patient Name: [name] Date of Service: 12/03/2014 8:00 AM Medical Record Number: [id number] Patient Account Number: [id number] Date of Birth/Gender: [birth year] (59 y.o. Female) Treating RN: Baruch Gouty,  RN, BSN, Velva Harman Primary Care Physician: [name] Other Clinician: Referring Physician: [name] Treating Physician/Extender: BURNS III, Charlean Sanfilippo in Treatment: 40 Education Assessment Education Provided To: Patient Education Topics Provided Wound/Skin Impairment: Methods: Occupational psychologist) Signed: 12/03/2014 4:29:34 PM By: Regan Lemming BSN, RN Entered By: Regan Lemming on 12/03/2014 09:40:05 [name] ([id number]) -------------------------------------------------------------------------------- Wound Assessment Details Patient Name: [name] Date of Service: 12/03/2014 8:00 AM Medical Record Number: [id number] Patient Account Number: [id number] Date of Birth/Sex: 09/13/1955 (59 y.o. Female) Treating RN: Afful, RN, BSN, Velva Harman Primary Care Physician: [name] Other Clinician: Referring Physician: [name] Treating Physician/Extender: BURNS III, WALTER Weeks in Treatment: 88 Wound Status Wound Number: 50 Primary Venous Leg Ulcer Etiology: Wound Location: Right Lower Leg - Lateral, Proximal Wound Status: Open Wounding Event: Blister Comorbid Lymphedema, Hypertension, Type II History: Diabetes Date Acquired: 11/14/2014 Weeks Of Treatment: 2 Clustered Wound: No Wound Measurements Length: (cm) 4.5 Width: (cm) 5.5 Depth: (cm) 0.1 Area: (cm) 19.439 Volume: (cm) 1.944 % Reduction in Area: 39.2% % Reduction in Volume: 39.2% Epithelialization: Small (1-33%) Tunneling: No Undermining: No Wound Description Classification: Partial Thickness Foul Odor A Diabetic Severity (Wagner): Grade 1 Wound Margin: Flat and Intact Exudate Amount: Large Exudate Type: Serous Exudate Color: amber fter Cleansing: No Wound Bed Granulation Amount: Large (67-100%) Exposed Structure Granulation Quality: Red Fascia Exposed: No Necrotic Amount: None Present (0%) Fat Layer Exposed: No Tendon Exposed: No Muscle Exposed: No Joint Exposed:  No Bone Exposed: No Limited to Skin Breakdown Periwound Skin Texture Texture Color No Abnormalities Noted: No No Abnormalities Noted: No Callus: No Atrophie Blanche: No Crepitus: No Cyanosis: No Gadsby, Austin K. ([id number]) Excoriation: No Ecchymosis: No Fluctuance: No Erythema: No Friable: Yes Hemosiderin Staining: No Induration: No Mottled: No Localized Edema: No Pallor: No Rash: No Rubor: No Scarring: No Temperature / Pain Moisture Temperature: No Abnormality No Abnormalities Noted: No Dry / Scaly: No Maceration: No Moist: Yes Wound Preparation Ulcer Cleansing: Other: soap and water, Topical Anesthetic Applied: None Treatment Notes Wound #50 (Right, Proximal, Lateral Lower Leg) 1. Cleansed with: Cleanse wound with antibacterial soap and water 3. Peri-wound Care: Moisturizing lotion 4. Dressing Applied: Iodoflex 5. Secondary Dressing Applied Gauze and Kerlix/Conform 7. Secured with 4 Layer Compression System - Bilateral Notes Left for edema management, right for wound and edema management, Toe healed. ABD and Kerlix to build up ankles, pink paste below knee to anchor. Electronic Signature(s) Signed: 12/03/2014 4:29:34 PM By: Regan Lemming BSN, RN Entered By: Regan Lemming on 12/03/2014 08:44:13 [name] ([id number]) -------------------------------------------------------------------------------- Vitals Details Patient Name: [name] Date of Service: 12/03/2014 8:00 AM Medical Record Number: [id number] Patient Account Number: [id number] Date of Birth/Sex: October 06, 1955 (59 y.o. Female) Treating RN: Afful, RN, BSN, Velva Harman Primary Care Physician: [name] Other Clinician: Referring Physician: [name] Treating Physician/Extender: BURNS III, Charlean Sanfilippo in Treatment: 37 Vital Signs Time Taken: 08:37 Temperature (F): 98.2 Pulse (bpm): 65 Respiratory Rate (breaths/min): 18 Blood Pressure (mmHg): 155/57 Reference Range: 80 - 120  mg / dl Electronic Signature(s) Signed: 12/03/2014 4:29:34 PM By: Regan Lemming BSN, RN Entered By: Regan Lemming on 12/03/2014 08:41:16

## 2014-12-10 ENCOUNTER — Encounter: Payer: Managed Care, Other (non HMO) | Admitting: Surgery

## 2014-12-10 DIAGNOSIS — L97219 Non-pressure chronic ulcer of right calf with unspecified severity: Secondary | ICD-10-CM | POA: Diagnosis not present

## 2014-12-10 NOTE — Progress Notes (Signed)
Autumn, [name] ([id number]) Visit Report for 12/10/2014 Arrival Information Details Patient Name: [name], [name]. Date of Service: 12/10/2014 8:00 AM Medical Record Number: [id number] Patient Account Number: [id number] Date of Birth/Sex: 1955-12-12 (59 y.o. Female) Treating RN: Montey Hora Primary Care Physician: [name] Other Clinician: Referring Physician: [name] Treating Physician/Extender: BURNS III, Charlean Sanfilippo in Treatment: 80 Visit Information History Since Last Visit Added or deleted any medications: No Patient Arrived: Cane Any new allergies or adverse reactions: No Arrival Time: 08:21 Had a fall or experienced change in No Accompanied By: self activities of daily living that may affect Transfer Assistance: None risk of falls: Patient Identification Verified: Yes Signs or symptoms of abuse/neglect since last No Secondary Verification Process Yes visito Completed: Hospitalized since last visit: No Patient Requires Transmission- No Pain Present Now: No Based Precautions: Patient Has Alerts: Yes Patient Alerts: Patient on Blood Thinner Electronic Signature(s) Signed: 12/10/2014 4:32:09 PM By: Montey Hora Entered By: Montey Hora on 12/10/2014 08:21:41 [name] ([id number]) -------------------------------------------------------------------------------- Encounter Discharge Information Details Patient Name: [name]. Date of Service: 12/10/2014 8:00 AM Medical Record Number: [id number] Patient Account Number: [id number] Date of Birth/Sex: Nov 20, 1955 (59 y.o. Female) Treating RN: Primary Care Physician: [name] Other Clinician: Referring Physician: [name] Treating Physician/Extender: BURNS III, Charlean Sanfilippo in Treatment: 24 Encounter Discharge Information Items Schedule Follow-up Appointment: No Medication Reconciliation completed No and provided to Patient/Care Tila Millirons: Provided on Clinical Summary of  Care: 12/10/2014 Form Type Recipient Paper Patient GI Electronic Signature(s) Signed: 12/10/2014 9:42:39 AM By: Ruthine Dose Entered By: Ruthine Dose on 12/10/2014 09:42:39 [name] ([id number]) -------------------------------------------------------------------------------- Lower Extremity Assessment Details Patient Name: [name]. Date of Service: 12/10/2014 8:00 AM Medical Record Number: [id number] Patient Account Number: [id number] Date of Birth/Sex: Dec 30, 1955 (59 y.o. Female) Treating RN: Montey Hora Primary Care Physician: [name] Other Clinician: Referring Physician: [name] Treating Physician/Extender: BURNS III, Charlean Sanfilippo in Treatment: 89 Edema Assessment Assessed: [Left: No] [Right: No] E[Left: dema] [Right: :] Calf Left: Right: Point of Measurement: 24 cm From Medial Instep 48.6 cm 41.2 cm Ankle Left: Right: Point of Measurement: 8 cm From Medial Instep 25.2 cm 23.4 cm Vascular Assessment Pulses: Posterior Tibial Dorsalis Pedis Palpable: [Left:Yes] [Right:Yes] Extremity colors, hair growth, and conditions: Extremity Color: [Left:Hyperpigmented] [Right:Hyperpigmented] Hair Growth on Extremity: [Left:No] [Right:No] Temperature of Extremity: [Left:Warm] [Right:Warm] Capillary Refill: [Left:< 3 seconds] [Right:< 3 seconds] Toe Nail Assessment Left: Right: Thick: Yes Yes Discolored: Yes Yes Deformed: Yes Yes Improper Length and Hygiene: Yes Yes Electronic Signature(s) Signed: 12/10/2014 4:32:09 PM By: Montey Hora Entered By: Montey Hora on 12/10/2014 08:51:11 [name] ([id number]) -------------------------------------------------------------------------------- Multi Wound Chart Details Patient Name: [name]. Date of Service: 12/10/2014 8:00 AM Medical Record Number: [id number] Patient Account Number: [id number] Date of Birth/Sex: 17-May-1956 (59 y.o. Female) Treating RN: Baruch Gouty, RN, BSN, Velva Harman Primary  Care Physician: [name] Other Clinician: Referring Physician: [name] Treating Physician/Extender: BURNS III, WALTER Weeks in Treatment: 89 Vital Signs Height(in): Pulse(bpm): 56 Weight(lbs): Blood Pressure 156/56 (mmHg): Body Mass Index(BMI): Temperature(F): 98.3 Respiratory Rate 18 (breaths/min): Photos: [50:No Photos] [N/A:N/A] Wound Location: [50:Right Lower Leg - Lateral, Proximal] [N/A:N/A] Wounding Event: [50:Blister] [N/A:N/A] Primary Etiology: [50:Venous Leg Ulcer] [N/A:N/A] Comorbid History: [50:Lymphedema, Hypertension, Type II Diabetes] [N/A:N/A] Date Acquired: [50:11/14/2014] [N/A:N/A] Weeks of Treatment: [50:3] [N/A:N/A] Wound Status: [50:Open] [N/A:N/A] Measurements L x W x D 3.3x3.8x0.1 [N/A:N/A] (cm) Area (cm) : [50:9.849] [N/A:N/A] Volume (cm) : [50:0.985] [N/A:N/A] % Reduction in Area: [50:69.20%] [N/A:N/A] % Reduction  in Volume: 69.20% [N/A:N/A] Classification: [50:Partial Thickness] [N/A:N/A] HBO Classification: [50:Grade 1] [N/A:N/A] Exudate Amount: [50:Large] [N/A:N/A] Exudate Type: [50:Serous] [N/A:N/A] Exudate Color: [50:amber] [N/A:N/A] Wound Margin: [50:Flat and Intact] [N/A:N/A] Granulation Amount: [50:Large (67-100%)] [N/A:N/A] Granulation Quality: [50:Red] [N/A:N/A] Necrotic Amount: [50:None Present (0%)] [N/A:N/A] Exposed Structures: [50:Fascia: No Fat: No Tendon: No Muscle: No] [N/A:N/A] Joint: No Bone: No Limited to Skin Breakdown Epithelialization: Small (1-33%) N/A N/A Periwound Skin Texture: Friable: Yes N/A N/A Edema: No Excoriation: No Induration: No Callus: No Crepitus: No Fluctuance: No Rash: No Scarring: No Periwound Skin Moist: Yes N/A N/A Moisture: Maceration: No Dry/Scaly: No Periwound Skin Color: Atrophie Blanche: No N/A N/A Cyanosis: No Ecchymosis: No Erythema: No Hemosiderin Staining: No Mottled: No Pallor: No Rubor: No Temperature: No Abnormality N/A N/A Tenderness on No N/A  N/A Palpation: Wound Preparation: Ulcer Cleansing: Other: N/A N/A soap and water Topical Anesthetic Applied: Other: lidocaine 4% Treatment Notes Electronic Signature(s) Signed: 12/10/2014 4:56:06 PM By: Regan Lemming BSN, RN Entered By: Regan Lemming on 12/10/2014 08:59:42 [name] ([id number]) -------------------------------------------------------------------------------- [place] Details Patient Name: [name] Date of Service: 12/10/2014 8:00 AM Medical Record Number: [id number] Patient Account Number: [id number] Date of Birth/Sex: 07-08-1955 (59 y.o. Female) Treating RN: Afful, RN, BSN, Velva Harman Primary Care Physician: [name] Other Clinician: Referring Physician: [name] Treating Physician/Extender: BURNS III, Charlean Sanfilippo in Treatment: 79 Active Inactive Nutrition Nursing Diagnoses: Impaired glucose control: actual or potential Goals: Patient/caregiver verbalizes understanding of need to maintain therapeutic glucose control per primary care physician Date Initiated: 12/16/2013 Goal Status: Active Interventions: Provide education on elevated blood sugars and impact on wound healing Notes: Soft Tissue Infection Nursing Diagnoses: Impaired tissue integrity Goals: Patient will remain free of wound infection Date Initiated: 12/16/2013 Goal Status: Active Interventions: Assess signs and symptoms of infection every visit Notes: Venous Leg Ulcer Nursing Diagnoses: Knowledge deficit related to disease process and management Potential for venous Insuffiency (use before diagnosis confirmed) GoalsDACY, FIGURES ([id number]) Patient will maintain optimal edema control Date Initiated: 08/18/2014 Goal Status: Active Interventions: Assess peripheral edema status every visit. Compression as ordered Treatment Activities: Therapeutic compression applied : 12/10/2014 Notes: Electronic Signature(s) Signed: 12/10/2014 4:56:06 PM  By: Regan Lemming BSN, RN Entered By: Regan Lemming on 12/10/2014 08:59:32 [name] ([id number]) -------------------------------------------------------------------------------- Wound Assessment Details Patient Name: [name]. Date of Service: 12/10/2014 8:00 AM Medical Record Number: [id number] Patient Account Number: [id number] Date of Birth/Sex: [birth year] (59 y.o. Female) Treating RN: Montey Hora Primary Care Physician: [name] Other Clinician: Referring Physician: [name] Treating Physician/Extender: BURNS III, WALTER Weeks in Treatment: 89 Wound Status Wound Number: [id number] Primary Venous Leg Ulcer Etiology: Wound Location: Right Lower Leg - Lateral, Proximal Wound Status: Open Wounding Event: Blister Comorbid Lymphedema, Hypertension, Type II History: Diabetes Date Acquired: 11/14/2014 Weeks Of Treatment: 3 Clustered Wound: No Photos Photo Uploaded By: Montey Hora on 12/10/2014 12:43:54 Wound Measurements Length: (cm) 3.3 Width: (cm) 3.8 Depth: (cm) 0.1 Area: (cm) 9.849 Volume: (cm) 0.985 % Reduction in Area: 69.2% % Reduction in Volume: 69.2% Epithelialization: Small (1-33%) Tunneling: No Undermining: No Wound Description Classification: Partial Thickness Foul Odor A Diabetic Severity (Wagner): Grade 1 Wound Margin: Flat and Intact Exudate Amount: Large Exudate Type: Serous Exudate Color: amber fter Cleansing: No Wound Bed Granulation Amount: Large (67-100%) Exposed Structure Granulation Quality: Red Fascia Exposed: No Bonine, Karuna K. ([id number]) Necrotic Amount: None Present (0%) Fat Layer Exposed: No Tendon Exposed: No Muscle Exposed: No Joint Exposed: No Bone Exposed: No Limited  to Skin Breakdown Periwound Skin Texture Texture Color No Abnormalities Noted: No No Abnormalities Noted: No Callus: No Atrophie Blanche: No Crepitus: No Cyanosis: No Excoriation: No Ecchymosis: No Fluctuance: No Erythema:  No Friable: Yes Hemosiderin Staining: No Induration: No Mottled: No Localized Edema: No Pallor: No Rash: No Rubor: No Scarring: No Temperature / Pain Moisture Temperature: No Abnormality No Abnormalities Noted: No Dry / Scaly: No Maceration: No Moist: Yes Wound Preparation Ulcer Cleansing: Other: soap and water, Topical Anesthetic Applied: Other: lidocaine 4%, Electronic Signature(s) Signed: 12/10/2014 8:55:12 AM By: Montey Hora Entered By: Montey Hora on 12/10/2014 08:55:12 [name] ([id number]) -------------------------------------------------------------------------------- Vitals Details Patient Name: [name] Date of Service: 12/10/2014 8:00 AM Medical Record Number: [id number] Patient Account Number: [id number] Date of Birth/Sex: August 03, 1955 (59 y.o. Female) Treating RN: Montey Hora Primary Care Physician: [name] Other Clinician: Referring Physician: [name] Treating Physician/Extender: BURNS III, WALTER Weeks in Treatment: 80 Vital Signs Time Taken: 08:47 Temperature (F): 98.3 Pulse (bpm): 56 Respiratory Rate (breaths/min): 18 Blood Pressure (mmHg): 156/56 Reference Range: 80 - 120 mg / dl Electronic Signature(s) Signed: 12/10/2014 4:32:09 PM By: Montey Hora Entered By: Montey Hora on 12/10/2014 08:47:12

## 2014-12-10 NOTE — Progress Notes (Signed)
[name] ([id number]) Visit Report for 12/10/2014 Chief Complaint Document Details Patient Name: [name]. Date of Service: 12/10/2014 8:00 AM Medical Record Number: [id number] Patient Account Number: [id number] Date of Birth/Sex: April 15, 1956 (59 y.o. 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 Electronic Signature(s) Signed: 12/10/2014 3:28:51 PM By: Loletha Grayer MD Entered By: Loletha Grayer on 12/10/2014 09:42:49 [name] ([id number]) -------------------------------------------------------------------------------- Debridement Details Patient Name: [name]. Date of Service: 12/10/2014 8:00 AM Medical Record Number: [id number] Patient Account Number: [id number] Date of Birth/Sex: [birth year] (59 y.o. Female) Treating RN: Primary Care Physician: [name] Other Clinician: Referring Physician: [name] Treating Physician/Extender: BURNS III, Charlean Sanfilippo in Treatment: 89 Debridement Performed for Wound #50 Right,Proximal,Lateral Lower Leg Assessment: Performed By: Physician BURNS III, Teressa Senter., MD Debridement: Debridement Pre-procedure Yes Verification/Time Out Taken: Start Time: 09:00 Pain Control: Lidocaine 4% Topical Solution Level: Skin/Subcutaneous Tissue Total Area Debrided (L x 3.3 (cm) x 3.8 (cm) = 12.54 (cm) W): Tissue and other Viable, Non-Viable, Exudate, Fat, Fibrin/Slough, Subcutaneous material debrided: Instrument: Curette Bleeding: Minimum Hemostasis Achieved: Pressure End Time: 09:01 Procedural Pain: 0 Post Procedural Pain: 1 Response to Treatment: Procedure was tolerated well Post Debridement Measurements of Total  Wound Length: (cm) 3.3 Width: (cm) 3.8 Depth: (cm) 0.2 Volume: (cm) 1.97 Electronic Signature(s) Signed: 12/10/2014 3:28:51 PM By: Loletha Grayer MD Entered By: Loletha Grayer on 12/10/2014 09:42:22 [name] ([id number]) -------------------------------------------------------------------------------- HPI Details Patient Name: [name] Date of Service: 12/10/2014 8:00 AM Medical Record Number: [id number] Patient Account Number: [id number] Date of Birth/Sex: July 26, 1955 (59 y.o. Female) Treating RN: Primary Care Physician: [name] Other Clinician: Referring Physician: [name] Treating Physician/Extender: BURNS III, Madelon Welsch Weeks in Treatment: 19 History of Present Illness HPI Description: 59yo w/ h/o BLE phlebolymphedema, type 2 DM, and obesity. No PVD. h/o chronic, recurrent BLE calf ulcers. Tolerating 4 layer compression. Ulcers recently healed, but recurred with juxtalite. Infrequently uses lymphedema pump. Fitted for custom compression stockings but not yet received. She returns to clinic today and is without complaints. No significant pain. Minimal drainage from right calf. No fever or chills. Electronic Signature(s) Signed: 12/10/2014 3:28:51 PM By: Loletha Grayer MD Entered By: Loletha Grayer on 12/10/2014 09:44:37 [name] ([id number]) -------------------------------------------------------------------------------- Physical Exam Details Patient Name: [name] Date of Service: 12/10/2014 8:00 AM Medical Record Number: [id number] Patient Account Number: [id number] Date of Birth/Sex: 1955/12/03 (59 y.o. Female) Treating RN: Primary Care Physician: [name] Other Clinician: Referring Physician: [name] Treating Physician/Extender: BURNS III, Kyra Laffey Weeks in Treatment: 89 Constitutional . Pulse regular. Respirations normal and unlabored. Afebrile. . Notes R lateral calf ulcer. Full thickness. No  cellulitis. 2+ pitting edema. Palpable DP bilaterally. L calf ulcerations remain healed. Electronic Signature(s) Signed: 12/10/2014 3:28:51 PM By: Loletha Grayer MD Entered By: Loletha Grayer on 12/10/2014 09:45:48 [name] ([id number]) -------------------------------------------------------------------------------- Physician Orders Details Patient Name: [name] Date of Service: 12/10/2014 8:00 AM Medical Record Number: [id number] Patient Account Number: [id number] Date of Birth/Sex: 1956-06-24 (59 y.o. Female) Treating RN: Baruch Gouty, RN, BSN, Velva Harman Primary Care Physician: [name] Other Clinician: Referring Physician: [name] Treating Physician/Extender: BURNS III, Charlean Sanfilippo in Treatment: 39 Verbal / Phone Orders: Yes Clinician: Afful, RN, BSN, Rita Read Back and Verified: Yes Diagnosis  Coding Wound Cleansing Wound #50 Right,Proximal,Lateral Lower Leg o Cleanse wound with mild soap and water o May shower with protection. o No tub bath. Skin Barriers/Peri-Wound Care Wound #50 Right,Proximal,Lateral Lower Leg o Barrier cream o Moisturizing lotion Primary Wound Dressing Wound #50 Right,Proximal,Lateral Lower Leg o Iodoflex Secondary Dressing o Conform/Kerlix - to build up ankle Wound #50 Right,Proximal,Lateral Lower Leg o ABD pad Dressing Change Frequency Wound #50 Right,Proximal,Lateral Lower Leg o Change dressing every week Follow-up Appointments Wound #50 Right,Proximal,Lateral Lower Leg o Return Appointment in 1 week. Edema Control Wound #50 Right,Proximal,Lateral Lower Leg o 4 Layer Compression System - Bilateral Electronic Signature(s) Signed: 12/10/2014 3:28:51 PM By: Loletha Grayer MD [name] ([id number]) Signed: 12/10/2014 4:56:06 PM By: Regan Lemming BSN, RN Entered By: Regan Lemming on 12/10/2014 09:02:22 [name]  ([id number]) -------------------------------------------------------------------------------- Problem List Details Patient Name: [name]. Date of Service: 12/10/2014 8:00 AM Medical Record Number: [id number] Patient Account Number: [id number] Date of Birth/Sex: [birth year] (59 y.o. Female) Treating RN: Primary Care Physician: [name] Other Clinician: Referring Physician: [name] Treating Physician/Extender: BURNS III, Charlean Sanfilippo in Treatment: 89 Active Problems ICD-9 Encounter Code Description Active Date Diagnosis 457.1 Other Lymphedema - excludes congenital, eyelid or vulva 03/27/2013 Yes 459.33 Chronic venous hypertension with ulcer and inflammation 03/27/2013 Yes 707.12 Ulcer of lower limbs, except pressure ulcer; Ulcer of calf 03/27/2013 Yes ICD-10 Encounter Code Description Active Date Diagnosis I89.0 Lymphedema, not elsewhere classified 03/26/2014 Yes E66.9 Obesity, unspecified 03/26/2014 Yes 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 08/27/2014 Yes E11.621 Type 2 diabetes mellitus with foot ulcer 10/08/2014 Yes Inactive Problems Resolved Problems ICD-10 Code Description Active Date Resolved Date I83.222 Varicose veins of left lower extremity with both ulcer of 11/12/2014 11/12/2014 calf and inflammation [name], [name] ([id number]) L84 Corns and callosities 11/12/2014 11/12/2014 Electronic Signature(s) Signed: 12/10/2014 3:28:51 PM By: Loletha Grayer MD Entered By: Loletha Grayer on 12/10/2014 09:39:27 [name] ([id number]) -------------------------------------------------------------------------------- Progress Note Details Patient Name: [name]. Date of Service: 12/10/2014 8:00 AM Medical Record Number: [id number] Patient Account Number: [id number] Date of Birth/Sex: [birth year] (59 y.o. 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 History of Present Illness (HPI) 59yo w/ h/o BLE phlebolymphedema, type 2 DM, and obesity. No PVD. h/o chronic, recurrent BLE calf ulcers. Tolerating 4 layer compression. Ulcers recently healed, but recurred with juxtalite. Infrequently uses lymphedema pump. Fitted for custom compression stockings but not yet received. She returns to clinic today and is without complaints. No significant pain. Minimal drainage from right calf. No fever or chills. Objective Constitutional Pulse regular. Respirations normal and unlabored. Afebrile. Vitals Time Taken: 8:47 AM, Temperature: 98.3 F, Pulse: 56 bpm, Respiratory Rate: 18 breaths/min, Blood Pressure: 156/56 mmHg. General Notes: R lateral calf ulcer. Full thickness. No cellulitis. 2+ pitting edema. Palpable DP bilaterally. L calf ulcerations remain healed. Integumentary (Hair, Skin) Wound #50 status is Open. Original cause of wound was Blister. The wound is located on the Right,Proximal,Lateral Lower Leg. The wound measures 3.3cm length x 3.8cm width x 0.1cm depth; 9.849cm^2 area and 0.985cm^3 volume. The wound is limited to skin breakdown. There is no tunneling or undermining noted. There is a large amount of serous drainage noted. The wound margin is flat and intact. There is large (  67-100%) red granulation within the wound bed. There is no necrotic tissue within the wound [name], [name] ([id number]) bed. The periwound skin appearance exhibited: Friable, Moist. The periwound skin appearance did not exhibit: Callus, Crepitus, Excoriation, Fluctuance, Induration, Localized Edema, Rash, Scarring, Dry/Scaly, Maceration, Atrophie Blanche, Cyanosis, Ecchymosis, Hemosiderin Staining, Mottled, Pallor, Rubor, Erythema.  Periwound temperature was noted as No Abnormality. 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 Procedures Wound #50 Wound #50 is a Venous Leg Ulcer located on the Right,Proximal,Lateral Lower Leg . There was a Skin/Subcutaneous Tissue Debridement [id number]) debridement with total area of 12.54 sq cm performed by BURNS III, Teressa Senter., MD. with the following instrument(s): Curette to remove Viable and Non-Viable tissue/material including Exudate, Fat, Fibrin/Slough, and Subcutaneous after achieving pain control using Lidocaine 4% Topical Solution. A time out was conducted prior to the start of the procedure. A Minimum amount of bleeding was controlled with Pressure. The procedure was tolerated well with a pain level of 0 throughout and a pain level of 1 following the procedure. Post Debridement Measurements: 3.3cm length x 3.8cm width x 0.2cm depth; 1.97cm^3 volume. Plan Wound Cleansing: [name], [name]. ([id number]) Wound #50 Right,Proximal,Lateral Lower Leg: Cleanse wound with mild soap and water May shower with protection. No tub bath. Skin Barriers/Peri-Wound Care: Wound #50 Right,Proximal,Lateral Lower Leg: Barrier cream Moisturizing lotion Primary Wound Dressing: Wound #50 Right,Proximal,Lateral Lower Leg: Iodoflex Secondary Dressing: Conform/Kerlix - to build up ankle Wound #50 Right,Proximal,Lateral Lower Leg: ABD pad Dressing Change Frequency: Wound #50 Right,Proximal,Lateral Lower Leg: Change dressing  every week Follow-up Appointments: Wound #50 Right,Proximal,Lateral Lower Leg: Return Appointment in 1 week. Edema Control: Wound #50 Right,Proximal,Lateral Lower Leg: 4 Layer Compression System - Bilateral Awaiting custom fit compression stockings, ready to use on the left. In the meantime continue with Iodoflex and 4-layer compression. Lymphedema pump daily. Electronic Signature(s) Signed: 12/10/2014 3:28:51 PM By: Loletha Grayer MD Entered By: Loletha Grayer on 12/10/2014 09:47:11 [name] ([id number]) -------------------------------------------------------------------------------- SuperBill Details Patient Name: [name] Date of Service: 12/10/2014 Medical Record Number: [id number] Patient Account Number: [id number] Date of Birth/Sex: 11-24-1955 (59 y.o. 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 

## 2014-12-15 ENCOUNTER — Emergency Department
Admission: EM | Admit: 2014-12-15 | Discharge: 2014-12-15 | Disposition: A | Discharge status: Home / Self Care | Payer: Managed Care, Other (non HMO) | Attending: Emergency Medicine | Admitting: Emergency Medicine

## 2014-12-15 ENCOUNTER — Encounter: Payer: Self-pay | Admitting: Radiology

## 2014-12-15 ENCOUNTER — Emergency Department: Payer: Managed Care, Other (non HMO)

## 2014-12-15 DIAGNOSIS — N289 Disorder of kidney and ureter, unspecified: Secondary | ICD-10-CM | POA: Diagnosis not present

## 2014-12-15 DIAGNOSIS — N23 Unspecified renal colic: Secondary | ICD-10-CM | POA: Insufficient documentation

## 2014-12-15 DIAGNOSIS — N3091 Cystitis, unspecified with hematuria: Secondary | ICD-10-CM | POA: Diagnosis not present

## 2014-12-15 DIAGNOSIS — R109 Unspecified abdominal pain: Secondary | ICD-10-CM | POA: Diagnosis present

## 2014-12-15 DIAGNOSIS — M1611 Unilateral primary osteoarthritis, right hip: Secondary | ICD-10-CM | POA: Insufficient documentation

## 2014-12-15 DIAGNOSIS — N3090 Cystitis, unspecified without hematuria: Secondary | ICD-10-CM

## 2014-12-15 LAB — URINALYSIS COMPLETE WITH MICROSCOPIC (ARMC ONLY)
Bilirubin Urine: NEGATIVE
Glucose, UA: NEGATIVE mg/dL
Ketones, ur: NEGATIVE mg/dL
Nitrite: NEGATIVE
PH: 7 (ref 5.0–8.0)
PROTEIN: 100 mg/dL — AB
Specific Gravity, Urine: 1.009 (ref 1.005–1.030)

## 2014-12-15 LAB — CBC WITH DIFFERENTIAL/PLATELET
BASOS ABS: 0.1 10*3/uL (ref 0–0.1)
Basophils Relative: 1 %
EOS PCT: 0 %
Eosinophils Absolute: 0 10*3/uL (ref 0–0.7)
HEMATOCRIT: 39.6 % (ref 35.0–47.0)
Hemoglobin: 12.8 g/dL (ref 12.0–16.0)
LYMPHS ABS: 0.9 10*3/uL — AB (ref 1.0–3.6)
Lymphocytes Relative: 10 %
MCH: 27.7 pg (ref 26.0–34.0)
MCHC: 32.4 g/dL (ref 32.0–36.0)
MCV: 85.5 fL (ref 80.0–100.0)
MONO ABS: 0.7 10*3/uL (ref 0.2–0.9)
Monocytes Relative: 7 %
NEUTROS ABS: 7.8 10*3/uL — AB (ref 1.4–6.5)
Neutrophils Relative %: 82 %
Platelets: 241 10*3/uL (ref 150–440)
RBC: 4.64 MIL/uL (ref 3.80–5.20)
RDW: 15.9 % — AB (ref 11.5–14.5)
WBC: 9.5 10*3/uL (ref 3.6–11.0)

## 2014-12-15 LAB — BASIC METABOLIC PANEL
Anion gap: 9 (ref 5–15)
BUN: 20 mg/dL (ref 6–20)
CO2: 26 mmol/L (ref 22–32)
Calcium: 8.8 mg/dL — ABNORMAL LOW (ref 8.9–10.3)
Chloride: 104 mmol/L (ref 101–111)
Creatinine, Ser: 2.08 mg/dL — ABNORMAL HIGH (ref 0.44–1.00)
GFR calc Af Amer: 28 mL/min — ABNORMAL LOW (ref >60)
GFR calc non Af Amer: 25 mL/min — ABNORMAL LOW (ref >60)
GLUCOSE: 124 mg/dL — AB (ref 65–99)
POTASSIUM: 3.7 mmol/L (ref 3.5–5.1)
Sodium: 139 mmol/L (ref 135–145)

## 2014-12-15 LAB — COMPREHENSIVE METABOLIC PANEL
ALK PHOS: 76 U/L (ref 38–126)
ALT: 10 U/L — AB (ref 14–54)
AST: 25 U/L (ref 15–41)
Albumin: 3.6 g/dL (ref 3.5–5.0)
Anion gap: 10 (ref 5–15)
BUN: 20 mg/dL (ref 6–20)
CO2: 25 mmol/L (ref 22–32)
Calcium: 9.7 mg/dL (ref 8.9–10.3)
Chloride: 105 mmol/L (ref 101–111)
Creatinine, Ser: 2.34 mg/dL — ABNORMAL HIGH (ref 0.44–1.00)
GFR calc non Af Amer: 21 mL/min — ABNORMAL LOW (ref >60)
GFR, EST AFRICAN AMERICAN: 25 mL/min — AB (ref >60)
GLUCOSE: 129 mg/dL — AB (ref 65–99)
Potassium: 4.1 mmol/L (ref 3.5–5.1)
Sodium: 140 mmol/L (ref 135–145)
TOTAL PROTEIN: 7.5 g/dL (ref 6.5–8.1)
Total Bilirubin: 1.4 mg/dL — ABNORMAL HIGH (ref 0.3–1.2)

## 2014-12-15 LAB — LIPASE, BLOOD: Lipase: 32 U/L (ref 22–51)

## 2014-12-15 MED ORDER — SULFAMETHOXAZOLE-TRIMETHOPRIM 800-160 MG PO TABS: 1.0000 | ORAL_TABLET | Freq: Two times a day (BID) | ORAL | Status: DC

## 2014-12-15 MED ORDER — SODIUM CHLORIDE 0.9 % IV SOLN
Freq: Once | INTRAVENOUS | Status: AC
  Administered 2014-12-15: 19:00:00 via INTRAVENOUS

## 2014-12-15 MED ORDER — CEFTRIAXONE SODIUM IN DEXTROSE 20 MG/ML IV SOLN
1.0000 g | Freq: Once | INTRAVENOUS | Status: AC
  Administered 2014-12-15: 1000 mg via INTRAVENOUS

## 2014-12-15 MED ORDER — OXYCODONE-ACETAMINOPHEN 5-325 MG PO TABS: 1.0000 | ORAL_TABLET | Freq: Four times a day (QID) | ORAL | Status: DC | PRN

## 2014-12-15 MED ORDER — ONDANSETRON HCL 4 MG/2ML IJ SOLN
4.0000 mg | Freq: Once | INTRAMUSCULAR | Status: AC
  Administered 2014-12-15: 4 mg via INTRAVENOUS

## 2014-12-15 MED ORDER — CEFTRIAXONE SODIUM IN DEXTROSE 20 MG/ML IV SOLN
INTRAVENOUS | Status: AC
  Administered 2014-12-15: 1000 mg via INTRAVENOUS
  Filled 2014-12-15: qty 50

## 2014-12-15 MED ORDER — ONDANSETRON HCL 4 MG/2ML IJ SOLN
INTRAMUSCULAR | Status: AC
  Filled 2014-12-15: qty 2

## 2014-12-15 NOTE — Discharge Instructions (Signed)
Abdominal Pain °Many things can cause abdominal pain. Usually, abdominal pain is not caused by a disease and will improve without treatment. It can often be observed and treated at home. Your health care provider will do a physical exam and possibly order blood tests and X-rays to help determine the seriousness of your pain. However, in many cases, more time must pass before a clear cause of the pain can be found. Before that point, your health care provider may not know if you need more testing or further treatment. °HOME CARE INSTRUCTIONS  °Monitor your abdominal pain for any changes. The following actions may help to alleviate any discomfort you are experiencing: °· Only take over-the-counter or prescription medicines as directed by your health care provider. °· Do not take laxatives unless directed to do so by your health care provider. °· Try a clear liquid diet (broth, tea, or water) as directed by your health care provider. Slowly move to a bland diet as tolerated. °SEEK MEDICAL CARE IF: °· You have unexplained abdominal pain. °· You have abdominal pain associated with nausea or diarrhea. °· You have pain when you urinate or have a bowel movement. °· You experience abdominal pain that wakes you in the night. °· You have abdominal pain that is worsened or improved by eating food. °· You have abdominal pain that is worsened with eating fatty foods. °· You have a fever. °SEEK IMMEDIATE MEDICAL CARE IF:  °· Your pain does not go away within 2 hours. °· You keep throwing up (vomiting). °· Your pain is felt only in portions of the abdomen, such as the right side or the left lower portion of the abdomen. °· You pass bloody or black tarry stools. °MAKE SURE YOU: °· Understand these instructions.   °· Will watch your condition.   °· Will get help right away if you are not doing well or get worse.   °Document Released: 03/23/2005 Document Revised: 06/18/2013 Document Reviewed: 02/20/2013 °[employer]® Patient Information  ©2015 [employer]. This information is not intended to replace advice given to you by your health care provider. Make sure you discuss any questions you have with your health care provider. °Urinary Tract Infection °Urinary tract infections (UTIs) can develop anywhere along your urinary tract. Your urinary tract is your body's drainage system for removing wastes and extra water. Your urinary tract includes two kidneys, two ureters, a bladder, and a urethra. Your kidneys are a pair of bean-shaped organs. Each kidney is about the size of your fist. They are located below your ribs, one on each side of your spine. °CAUSES °Infections are caused by microbes, which are microscopic organisms, including fungi, viruses, and bacteria. These organisms are so small that they can only be seen through a microscope. Bacteria are the microbes that most commonly cause UTIs. °SYMPTOMS  °Symptoms of UTIs may vary by age and gender of the patient and by the location of the infection. Symptoms in young women typically include a frequent and intense urge to urinate and a painful, burning feeling in the bladder or urethra during urination. Older women and men are more likely to be tired, shaky, and weak and have muscle aches and abdominal pain. A fever may mean the infection is in your kidneys. Other symptoms of a kidney infection include pain in your back or sides below the ribs, nausea, and vomiting. °DIAGNOSIS °To diagnose a UTI, your caregiver will ask you about your symptoms. Your caregiver also will ask to provide a urine sample. The urine sample   will be tested for bacteria and white blood cells. White blood cells are made by your body to help fight infection. °TREATMENT  °Typically, UTIs can be treated with medication. Because most UTIs are caused by a bacterial infection, they usually can be treated with the use of antibiotics. The choice of antibiotic and length of treatment depend on your symptoms and the type of bacteria  causing your infection. °HOME CARE INSTRUCTIONS °· If you were prescribed antibiotics, take them exactly as your caregiver instructs you. Finish the medication even if you feel better after you have only taken some of the medication. °· Drink enough water and fluids to keep your urine clear or pale yellow. °· Avoid caffeine, tea, and carbonated beverages. They tend to irritate your bladder. °· Empty your bladder often. Avoid holding urine for long periods of time. °· Empty your bladder before and after sexual intercourse. °· After a bowel movement, women should cleanse from front to back. Use each tissue only once. °SEEK MEDICAL CARE IF:  °· You have back pain. °· You develop a fever. °· Your symptoms do not begin to resolve within 3 days. °SEEK IMMEDIATE MEDICAL CARE IF:  °· You have severe back pain or lower abdominal pain. °· You develop chills. °· You have nausea or vomiting. °· You have continued burning or discomfort with urination. °MAKE SURE YOU:  °· Understand these instructions. °· Will watch your condition. °· Will get help right away if you are not doing well or get worse. °Document Released: 03/23/2005 Document Revised: 12/13/2011 Document Reviewed: 07/22/2011 °[employer]® Patient Information ©2015 [employer]. This information is not intended to replace advice given to you by your health care provider. Make sure you discuss any questions you have with your health care provider. ° °

## 2014-12-15 NOTE — ED Provider Notes (Signed)
[facility] Emergency Department Provider Note     Time seen: ----------------------------------------- 3:22 PM on 12/15/2014 -----------------------------------------    I have reviewed the triage vital signs and the nursing notes.   HISTORY  Chief Complaint No chief complaint on file.    HPI [name] is a 59 y.o. female who presents ER for right side pain that started today. She describes sharp abdominal pain as well as pain that is dull in her right hip. 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 It is sharp and associated vomiting, nothing makes it better or worse. Has not had a history of same.   No past medical history on file.  There are no active problems to display for this patient.   No past surgical history on file.  Allergies Review of patient's allergies indicates not on file.  Social History History  Substance Use Topics  . Smoking status: Not on file  . Smokeless tobacco: Not on file  . Alcohol Use: Not on file    Review of Systems Constitutional: Negative for fever. Eyes: Negative for visual changes. ENT: Negative for sore throat. Cardiovascular: Negative for chest pain. Respiratory: Negative for shortness of breath. Gastrointestinal: Positive for abdominal pain and vomiting Genitourinary: Negative for dysuria. Musculoskeletal: Positive for right hip pain Skin: Negative for rash. Neurological: Negative for headaches, focal weakness or numbness. Positive for difficulty ambulating  10-point ROS otherwise negative.  ____________________________________________   PHYSICAL EXAM:  VITAL SIGNS: ED Triage Vitals  Enc Vitals Group     BP --      Pulse --      Resp --      Temp --      Temp src --      SpO2 --      Weight --      Height --      Head Cir --      Peak Flow --      Pain Score --      Pain Loc --      Pain Edu? --      Excl. in Center? --      Constitutional: Alert and oriented. Well appearing and in no distress. Eyes: Conjunctivae are normal. PERRL. Normal extraocular movements. ENT   Head: Normocephalic and atraumatic.   Nose: No congestion/rhinnorhea.   Mouth/Throat: Mucous membranes are moist.   Neck: No stridor. Hematological/Lymphatic/Immunilogical: No cervical lymphadenopathy. Cardiovascular: Normal rate, regular rhythm. Normal and symmetric distal pulses are present in all extremities. No murmurs, rubs, or gallops. Respiratory: Normal respiratory effort without tachypnea nor retractions. Breath sounds are clear and equal bilaterally. No wheezes/rales/rhonchi. Gastrointestinal: There is specific right lower quadrant tenderness, no rebound or guarding. Musculoskeletal: Range of motion seems intact, she has marked lymphedema both legs. There is tenderness over the right hip. Neurologic:  Normal speech and language. No gross focal neurologic deficits are appreciated. Speech is normal. No gait instability. Skin:  Skin is warm, dry and intact. No rash noted. Psychiatric: Mood and affect are normal. Speech and behavior are normal. Patient exhibits appropriate insight and judgment.  ____________________________________________  ED COURSE:  Pertinent labs & imaging results that were available during my care of the patient were reviewed by me and considered in my medical decision making (see chart for details). Patient will need abdominal labs, urine. She may need CT scan for further evaluation ____________________________________________    LABS (pertinent positives/negatives)  Labs Reviewed  CBC  WITH DIFFERENTIAL/PLATELET - Abnormal; Notable for the following:    RDW 15.9 (*)    Neutro Abs 7.8 (*)    Lymphs Abs 0.9 (*)    All other components within normal limits  COMPREHENSIVE METABOLIC PANEL - Abnormal; Notable for the following:    Glucose, Bld 129 (*)    Creatinine, Ser 2.34 (*)    ALT 10 (*)     Total Bilirubin 1.4 (*)    GFR calc non Af Amer 21 (*)    GFR calc Af Amer 25 (*)    All other components within normal limits  URINALYSIS COMPLETEWITH MICROSCOPIC ([facility] ONLY) - Abnormal; Notable for the following:    Color, Urine STRAW (*)    APPearance CLEAR (*)    Hgb urine dipstick 2+ (*)    Protein, ur 100 (*)    Leukocytes, UA TRACE (*)    Bacteria, UA RARE (*)    Squamous Epithelial / LPF 0-5 (*)    All other components within normal limits  BASIC METABOLIC PANEL - Abnormal; Notable for the following:    Glucose, Bld 124 (*)    Creatinine, Ser 2.08 (*)    Calcium 8.8 (*)    GFR calc non Af Amer 25 (*)    GFR calc Af Amer 28 (*)    All other components within normal limits  LIPASE, BLOOD    RADIOLOGY  IMPRESSION: Mild asymmetric right renal swelling and perinephric stranding. Differential diagnosis includes pyelonephritis and recently passed ureteral calculus. No persistent ureteral calculus or hydronephrosis visualized.  Cholelithiasis. No radiographic evidence of cholecystitis.  ____________________________________________  FINAL ASSESSMENT AND PLAN  Abdominal pain, hip pain, cystitis, renal insufficiency  Plan: Patient with likely recently passed stone versus pyelonephritis. She is given IV 1 g Rocephin. Pain is currently under control, will be able to be discharged with oral antibiotics and pain control. Renal function was improving nicely here after a liter fluid. Patient be encouraged to have increased by mouth intake. She is advised to follow-up with her doctor in 2-3 days for evaluation on Septra and Percocet as needed.   Earleen Newport, MD   Earleen Newport, MD 12/15/14 2132

## 2014-12-15 NOTE — ED Notes (Signed)
Dr Jimmye Norman aware of pts BP,  She has not taken her daily meds for BP but she has them with her in her pocket.   She has already discussed with Dr Jimmye Norman and its [facility] per him that pt take her regular scheduled meds.

## 2014-12-15 NOTE — ED Notes (Signed)
IV fluid bolus restarted as pt was disconnected to ambulate to the rest room. Pt ambulates w/ some difficulty w/ the assistance of a cane, gross bilat peripheral edema noted. Pt assisted to sitting in bedside chair d/t pt experiencing difficulties getting into the bed. Pt admits to some improvement in her pain as compared to the pain she experienced last night. Pt in no acute distress A&Ox4, speaking complete sentences, skin warm and dry.

## 2014-12-15 NOTE — ED Notes (Signed)
Pt presents via EMS from work.  She c/o nausea and vomiting that started last night with lower abdominal pain and urinary frequency

## 2014-12-15 NOTE — ED Notes (Signed)
At 1600 pt reported that she needed to use the bathroom,  She was assisted to the commode in her room.  She is still in the bathroom at this time.   She reported to RN that she has soiled her clothes.  Pt states  That she will remain in hospital gown while she is being evaluated.   She remains in the bathroom at this time,   She denies the offer for help from nurse.   She was instructed to use call bell in bathroom for assistance when needed.

## 2014-12-15 NOTE — ED Notes (Signed)
D/c instructions reviewed w/ pt - pt denies any further questions or concerns at present. Rx given x2

## 2014-12-15 NOTE — ED Notes (Signed)
Pt reports she took her home antihypertensives approx 1800-1845.

## 2014-12-17 ENCOUNTER — Ambulatory Visit: Payer: Managed Care, Other (non HMO) | Admitting: Surgery

## 2014-12-18 ENCOUNTER — Telehealth: Payer: Self-pay | Admitting: Emergency Medicine

## 2014-12-18 NOTE — ED Notes (Signed)
Says her company is requiring a return to work note so she can return to work.  Patient was not given a work note, so we will put one at stat desk indicating pateitn was seen and released.

## 2014-12-19 ENCOUNTER — Encounter: Payer: Managed Care, Other (non HMO) | Admitting: Surgery

## 2014-12-19 DIAGNOSIS — L97219 Non-pressure chronic ulcer of right calf with unspecified severity: Secondary | ICD-10-CM | POA: Diagnosis not present

## 2014-12-22 ENCOUNTER — Ambulatory Visit (INDEPENDENT_AMBULATORY_CARE_PROVIDER_SITE_OTHER): Payer: Managed Care, Other (non HMO) | Admitting: Family Medicine

## 2014-12-22 ENCOUNTER — Encounter: Payer: Self-pay | Admitting: Family Medicine

## 2014-12-22 VITALS — BP 130/80 | HR 64 | Ht 65.0 in | Wt 284.0 lb

## 2014-12-22 DIAGNOSIS — N200 Calculus of kidney: Secondary | ICD-10-CM

## 2014-12-22 DIAGNOSIS — Z09 Encounter for follow-up examination after completed treatment for conditions other than malignant neoplasm: Secondary | ICD-10-CM

## 2014-12-22 DIAGNOSIS — E11628 Type 2 diabetes mellitus with other skin complications: Secondary | ICD-10-CM

## 2014-12-22 LAB — POCT URINALYSIS DIPSTICK
BILIRUBIN UA: NEGATIVE
Blood, UA: NEGATIVE
Glucose, UA: NEGATIVE
KETONES UA: NEGATIVE
LEUKOCYTES UA: NEGATIVE
Nitrite, UA: NEGATIVE
Protein, UA: NEGATIVE
Spec Grav, UA: 1.02
Urobilinogen, UA: 0.2
pH, UA: 5

## 2014-12-22 NOTE — Progress Notes (Addendum)
Name: [name]   MRN: [id number]    DOB: [birth year]   Date:12/22/2014       Progress Note  Subjective  Chief Complaint  Chief Complaint  Patient presents with  . Hospitalization Follow-up    hematuria    Abdominal Pain This is a chronic problem. The current episode started 1 to 4 weeks ago. The onset quality is sudden. The problem occurs intermittently. The problem has been resolved. The pain is located in the RLQ. The quality of the pain is aching. The abdominal pain radiates to the suprapubic region. Associated symptoms include frequency, hematuria, nausea and vomiting. Pertinent negatives include no anorexia, arthralgias, belching, constipation, diarrhea, dysuria, fever, flatus, headaches, hematochezia, melena, myalgias or weight loss. Nothing aggravates the pain. The pain is relieved by certain positions (sulfa/analgesic). She has tried acetaminophen and oral narcotic analgesics for the symptoms. The treatment provided mild relief. Prior diagnostic workup includes CT scan (eval for poss passed kidney stone).    No problem-specific assessment & plan notes found for this encounter.   History reviewed. No pertinent past medical history.  History reviewed. No pertinent past surgical history.  Family History  Problem Relation Age of Onset  . Cancer Mother   . Diabetes Father     History   Social History  . Marital Status: Single    Spouse Name: N/A  . Number of Children: N/A  . Years of Education: N/A   Occupational History  . Not on file.   Social History Main Topics  . Smoking status: Never Smoker   . Smokeless tobacco: Not on file  . Alcohol Use: No  . Drug Use: No  . Sexual Activity: No   Other Topics Concern  . Not on file   Social History Narrative    No Known Allergies   Review of Systems  Constitutional: Negative for fever and weight loss.  Respiratory: Negative for cough, hemoptysis and sputum production.   Cardiovascular: Negative for chest  pain, palpitations and orthopnea.  Gastrointestinal: Positive for nausea, vomiting and abdominal pain. Negative for diarrhea, constipation, blood in stool, melena, hematochezia, anorexia and flatus.  Genitourinary: Positive for frequency, hematuria and flank pain. Negative for dysuria and urgency.  Musculoskeletal: Negative for myalgias and arthralgias.  Neurological: Negative for headaches.  Endo/Heme/Allergies: Does not bruise/bleed easily.  Psychiatric/Behavioral: Negative for suicidal ideas. The patient is not nervous/anxious.      Objective  Filed Vitals:   12/22/14 0815  BP: 130/80  Pulse: 64  Height: 5\' 5"  (1.651 m)  Weight: 284 lb (128.822 kg)    Physical Exam  Constitutional: She is well-developed, well-nourished, and in no distress. No distress.  HENT:  Head: Normocephalic and atraumatic.  Right Ear: External ear normal.  Left Ear: External ear normal.  Nose: Nose normal.  Mouth/Throat: Oropharynx is clear and moist.  Eyes: Conjunctivae and EOM are normal. Pupils are equal, round, and reactive to light. Right eye exhibits no discharge. Left eye exhibits no discharge.  Neck: Normal range of motion. Neck supple. No JVD present. No thyromegaly present.  Cardiovascular: Normal rate, regular rhythm, normal heart sounds and intact distal pulses.  Exam reveals no gallop and no friction rub.   No murmur heard. Pulmonary/Chest: Effort normal and breath sounds normal. No respiratory distress. She has no wheezes.  Abdominal: Soft. Bowel sounds are normal. She exhibits no distension and no mass. There is no tenderness. There is no rebound and no guarding.  Musculoskeletal: Normal range of motion. She exhibits no  edema.  Lymphadenopathy:    She has no cervical adenopathy.  Neurological: She is alert. She has normal reflexes.  Skin: Skin is warm and dry. No rash noted. She is not diaphoretic. No erythema.  Psychiatric: Mood and affect normal.      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 Macon [facility]  12/22/2014

## 2014-12-23 ENCOUNTER — Encounter: Payer: Managed Care, Other (non HMO) | Admitting: Surgery

## 2014-12-23 DIAGNOSIS — L97219 Non-pressure chronic ulcer of right calf with unspecified severity: Secondary | ICD-10-CM | POA: Diagnosis not present

## 2014-12-24 NOTE — Progress Notes (Signed)
[name], [name] ([id number]) Visit Report for 12/23/2014 Arrival Information Details Patient Name: [name]. Date of Service: 12/23/2014 8:15 AM Medical Record Number: [id number] Patient Account Number: [id number] Date of Birth/Sex: 27-Jan-1956 (59 y.o. Female) Treating RN: Afful, RN, BSN, Velva Harman Primary Care Physician: [name] Other Clinician: Referring Physician: [name] Treating Physician/Extender: Frann Rider in Treatment: 73 Visit Information History Since Last Visit Any new allergies or adverse reactions: No Patient Arrived: [name] Had a fall or experienced change in No Arrival Time: 08:25 activities of daily living that may affect Accompanied By: self risk of falls: Transfer Assistance: None Signs or symptoms of abuse/neglect since last No Patient Identification Verified: Yes visito Secondary Verification Process Yes Hospitalized since last visit: No Completed: Has Dressing in Place as Prescribed: Yes Patient Requires Transmission- No Has Compression in Place as Prescribed: Yes Based Precautions: Pain Present Now: No Patient Has Alerts: Yes Patient Alerts: Patient on Blood Thinner Electronic Signature(s) Signed: 12/23/2014 4:21:14 PM By: Regan Lemming BSN, RN Entered By: Regan Lemming on 12/23/2014 08:26:51 [name] ([id number]) -------------------------------------------------------------------------------- Clinic Level of Care Assessment Details Patient Name: [name]. Date of Service: 12/23/2014 8:15 AM Medical Record Number: [id number] Patient Account Number: [id number] Date of Birth/Sex: [birth year] (59 y.o. Female) Treating RN: Afful, RN, BSN, Velva Harman Primary Care Physician: [name] Other Clinician: Referring Physician: [name] Treating Physician/Extender: Frann Rider in Treatment: 46 Clinic Level of Care Assessment Items TOOL 4 Quantity Score []  - Use when only an EandM is performed on FOLLOW-UP  visit 0 ASSESSMENTS - Nursing Assessment / Reassessment X - Reassessment of Co-morbidities (includes updates in patient status) 1 10 X - Reassessment of Adherence to Treatment Plan 1 5 ASSESSMENTS - Wound and Skin Assessment / Reassessment X - Simple Wound Assessment / Reassessment - one wound 1 5 []  - Complex Wound Assessment / Reassessment - multiple wounds 0 []  - Dermatologic / Skin Assessment (not related to wound area) 0 ASSESSMENTS - Focused Assessment []  - Circumferential Edema Measurements - multi extremities 0 []  - Nutritional Assessment / Counseling / Intervention 0 []  - Lower Extremity Assessment (monofilament, tuning fork, pulses) 0 []  - Peripheral Arterial Disease Assessment (using hand held doppler) 0 ASSESSMENTS - Ostomy and/or Continence Assessment and Care []  - Incontinence Assessment and Management 0 []  - Ostomy Care Assessment and Management (repouching, etc.) 0 PROCESS - Coordination of Care X - Simple Patient / Family Education for ongoing care 1 15 []  - Complex (extensive) Patient / Family Education for ongoing care 0 []  - Staff obtains Programmer, systems, Records, Test Results / Process Orders 0 []  - Staff telephones HHA, Nursing Homes / Clarify orders / etc 0 []  - Routine Transfer to another Facility (non-emergent condition) 0 Cerda, Stasha K. ([id number]) []  - Routine Hospital Admission (non-emergent condition) 0 []  - New Admissions / Biomedical engineer / Ordering NPWT, Apligraf, etc. 0 []  - Emergency Hospital Admission (emergent condition) 0 []  - Simple Discharge Coordination 0 []  - Complex (extensive) Discharge Coordination 0 PROCESS - Special Needs []  - Pediatric / Minor Patient Management 0 []  - Isolation Patient Management 0 []  - Hearing / Language / Visual special needs 0 []  - Assessment of Community assistance (transportation, D/C planning, etc.) 0 []  - Additional assistance / Altered mentation 0 []  - Support Surface(s) Assessment (bed, cushion, seat,  etc.) 0 INTERVENTIONS - Wound Cleansing / Measurement X - Simple Wound Cleansing - one wound 1 5 []  - Complex Wound Cleansing - multiple wounds 0 []  -  Wound Imaging (photographs - any number of wounds) 0 []  - Wound Tracing (instead of photographs) 0 []  - Simple Wound Measurement - one wound 0 []  - Complex Wound Measurement - multiple wounds 0 INTERVENTIONS - Wound Dressings X - Small Wound Dressing one or multiple wounds 1 10 []  - Medium Wound Dressing one or multiple wounds 0 []  - Large Wound Dressing one or multiple wounds 0 []  - Application of Medications - topical 0 []  - Application of Medications - injection 0 INTERVENTIONS - Miscellaneous []  - External ear exam 0 Mcmenamin, Ashia K. ([id number]) []  - Specimen Collection (cultures, biopsies, blood, body fluids, etc.) 0 []  - Specimen(s) / Culture(s) sent or taken to Lab for analysis 0 []  - Patient Transfer (multiple staff / Harrel Lemon Lift / Similar devices) 0 []  - Simple Staple / Suture removal (25 or less) 0 []  - Complex Staple / Suture removal (26 or more) 0 []  - Hypo / Hyperglycemic Management (close monitor of Blood Glucose) 0 []  - Ankle / Brachial Index (ABI) - do not check if billed separately 0 X - Vital Signs 1 5 Has the patient been seen at the hospital within the last three years: Yes Total Score: 55 Level Of Care: New/Established - Level 2 Electronic Signature(s) Signed: 12/23/2014 4:21:14 PM By: Regan Lemming BSN, RN Entered By: Regan Lemming on 12/23/2014 09:07:28 [name] ([id number]) -------------------------------------------------------------------------------- Encounter Discharge Information Details Patient Name: [name]. Date of Service: 12/23/2014 8:15 AM Medical Record Number: [id number] Patient Account Number: [id number] Date of Birth/Sex: 02/23/1956 (58 y.o. Female) Treating RN: Primary Care Physician: [name] Other Clinician: Referring Physician: [name] Treating  Physician/Extender: Frann Rider in Treatment: 76 Encounter Discharge Information Items Schedule Follow-up Appointment: No Medication Reconciliation completed No and provided to Patient/Care [name]: Provided on Clinical Summary of Care: 12/23/2014 Form Type Recipient Paper Patient GI Electronic Signature(s) Signed: 12/23/2014 9:37:51 AM By: Ruthine Dose Entered By: Ruthine Dose on 12/23/2014 09:37:51 [name] ([id number]) -------------------------------------------------------------------------------- Lower Extremity Assessment Details Patient Name: [name]. Date of Service: 12/23/2014 8:15 AM Medical Record Number: [id number] Patient Account Number: [id number] Date of Birth/Sex: [birth year] (59 y.o. Female) Treating RN: Afful, RN, BSN, Velva Harman Primary Care Physician: [name] Other Clinician: Referring Physician: [name] Treating Physician/Extender: Frann Rider in Treatment: 90 Edema Assessment Assessed: Shirlyn Goltz: No] [Right: No] E[Left: dema] [Right: :] Calf Left: Right: Point of Measurement: 24 cm From Medial Instep 43 cm 38 cm Ankle Left: Right: Point of Measurement: 8 cm From Medial Instep 24.5 cm 22.2 cm Vascular Assessment Claudication: Claudication Assessment [Left:None] [Right:None] Pulses: Posterior Tibial Dorsalis Pedis Palpable: [Left:Yes] [Right:Yes] Extremity colors, hair growth, and conditions: Extremity Color: [Left:Mottled] [Right:Mottled] Hair Growth on Extremity: [Left:No] [Right:No] Temperature of Extremity: [Left:Warm] [Right:Warm] Capillary Refill: [Left:< 3 seconds] [Right:< 3 seconds] Toe Nail Assessment Left: Right: Thick: Yes Yes Discolored: Yes Yes Deformed: Yes Yes Improper Length and Hygiene: Yes Yes Electronic Signature(s) Signed: 12/23/2014 4:21:14 PM By: Regan Lemming BSN, RN Entered By: Regan Lemming on 12/23/2014 08:40:28 [name] ([id number]) [name], [name]  ([id number]) -------------------------------------------------------------------------------- Multi Wound Chart Details Patient Name: [name]. Date of Service: 12/23/2014 8:15 AM Medical Record Number: [id number] Patient Account Number: [id number] Date of Birth/Sex: [birth year] (59 y.o. Female) Treating RN: Baruch Gouty, RN, BSN, Velva Harman Primary Care Physician: [name] Other Clinician: Referring Physician: [name] Treating Physician/Extender: Frann Rider in Treatment: 90 Vital Signs Height(in): Pulse(bpm): 56 Weight(lbs): Blood Pressure (mmHg): Body Mass Index(BMI): Temperature(F): 98.7 Respiratory Rate  18 (breaths/min): Photos: [50:No Photos] [N/A:N/A] Wound Location: [50:Right Lower Leg - Lateral, Proximal] [N/A:N/A] Wounding Event: [50:Blister] [N/A:N/A] Primary Etiology: [50:Venous Leg Ulcer] [N/A:N/A] Comorbid History: [50:Lymphedema, Hypertension, Type II Diabetes] [N/A:N/A] Date Acquired: [50:11/14/2014] [N/A:N/A] Weeks of Treatment: [50:4] [N/A:N/A] Wound Status: [50:Healed - Epithelialized] [N/A:N/A] Measurements L x W x D 0x0x0 [N/A:N/A] (cm) Area (cm) : [50:0] [N/A:N/A] Volume (cm) : [50:0] [N/A:N/A] % Reduction in Area: [50:100.00%] [N/A:N/A] % Reduction in Volume: 100.00% [N/A:N/A] Classification: [50:Partial Thickness] [N/A:N/A] HBO Classification: [50:Grade 0] [N/A:N/A] Exudate Amount: [50:None Present] [N/A:N/A] Granulation Amount: [50:None Present (0%)] [N/A:N/A] Necrotic Amount: [50:None Present (0%)] [N/A:N/A] Exposed Structures: [50:Fascia: No Fat: No Tendon: No Muscle: No Joint: No Bone: No Limited to Skin Breakdown] [N/A:N/A] Periwound Skin Texture: Edema: Yes N/A N/A Excoriation: No Induration: No Callus: No Crepitus: No Fluctuance: No Friable: No Rash: No Scarring: No Periwound Skin Maceration: No N/A N/A Moisture: Moist: No Dry/Scaly: No Periwound Skin Color: Atrophie Blanche: No N/A N/A Cyanosis: No Ecchymosis:  No Erythema: No Hemosiderin Staining: No Mottled: No Pallor: No Rubor: No Temperature: No Abnormality N/A N/A Tenderness on No N/A N/A Palpation: Wound Preparation: Ulcer Cleansing: Other: N/A N/A washed with soap and wter Treatment Notes Electronic Signature(s) Signed: 12/23/2014 4:21:14 PM By: Regan Lemming BSN, RN Entered By: Regan Lemming on 12/23/2014 09:02:03 [name] ([id number]) -------------------------------------------------------------------------------- [place] Details Patient Name: [name]. Date of Service: 12/23/2014 8:15 AM Medical Record Number: [id number] Patient Account Number: [id number] Date of Birth/Sex: Jun 04, 1956 (59 y.o. Female) Treating RN: Afful, RN, BSN, Velva Harman Primary Care Physician: [name] Other Clinician: Referring Physician: [name] Treating Physician/Extender: Frann Rider in Treatment: 52 Active Inactive Nutrition Nursing Diagnoses: Impaired glucose control: actual or potential Goals: Patient/caregiver verbalizes understanding of need to maintain therapeutic glucose control per primary care physician Date Initiated: 12/16/2013 Goal Status: Active Interventions: Provide education on elevated blood sugars and impact on wound healing Notes: Soft Tissue Infection Nursing Diagnoses: Impaired tissue integrity Goals: Patient will remain free of wound infection Date Initiated: 12/16/2013 Goal Status: Active Interventions: Assess signs and symptoms of infection every visit Notes: Venous Leg Ulcer Nursing Diagnoses: Knowledge deficit related to disease process and management Potential for venous Insuffiency (use before diagnosis confirmed) GoalsSTELLE, STROME ([id number]) Patient will maintain optimal edema control Date Initiated: 08/18/2014 Goal Status: Active Interventions: Assess peripheral edema status every visit. Compression as ordered Treatment Activities: Therapeutic  compression applied : 12/23/2014 Notes: Electronic Signature(s) Signed: 12/23/2014 4:21:14 PM By: Regan Lemming BSN, RN Entered By: Regan Lemming on 12/23/2014 09:01:49 [name] ([id number]) -------------------------------------------------------------------------------- Pain Assessment Details Patient Name: [name]. Date of Service: 12/23/2014 8:15 AM Medical Record Number: [id number] Patient Account Number: [id number] Date of Birth/Sex: 02-28-1956 (59 y.o. Female) Treating RN: Baruch Gouty, RN, BSN, Velva Harman Primary Care Physician: [name] Other Clinician: Referring Physician: [name] Treating Physician/Extender: Frann Rider in Treatment: 36 Active Problems Location of Pain Severity and Description of Pain Patient Has Paino No Site Locations Pain Management and Medication Current Pain Management: Electronic Signature(s) Signed: 12/23/2014 4:21:14 PM By: Regan Lemming BSN, RN Entered By: Regan Lemming on 12/23/2014 08:28:52 [name] ([id number]) -------------------------------------------------------------------------------- Wound Assessment Details Patient Name: [name]. Date of Service: 12/23/2014 8:15 AM Medical Record Number: [id number] Patient Account Number: [id number] Date of Birth/Sex: Mar 15, 1956 (59 y.o. Female) Treating RN: Baruch Gouty, RN, BSN, Velva Harman Primary Care Physician: [name] Other Clinician: Referring Physician: [name] Treating Physician/Extender: Frann Rider in Treatment: 90 Wound Status Wound Number:  50 Primary Venous Leg Ulcer Etiology: Wound Location: Right Lower Leg - Lateral, Proximal Wound Status: Healed - Epithelialized Wounding Event: Blister Comorbid Lymphedema, Hypertension, Type II History: Diabetes Date Acquired: 11/14/2014 Weeks Of Treatment: 4 Clustered Wound: No Photos Photo Uploaded By: Regan Lemming on 12/23/2014 16:03:14 Wound Measurements Length: (cm) 0 % Reduction Width: (cm) 0 %  Reduction Depth: (cm) 0 Tunneling: Area: (cm) 0 Underminin Volume: (cm) 0 in Area: 100% in Volume: 100% No g: No Wound Description Classification: Partial Thickness Foul Odor A Diabetic Severity (Wagner): Grade 0 Exudate Amount: None Present fter Cleansing: No Wound Bed Granulation Amount: None Present (0%) Exposed Structure Necrotic Amount: None Present (0%) Fascia Exposed: No Fat Layer Exposed: No Tendon Exposed: No Muscle Exposed: No Kenealy, Aliya K. ([id number]) Joint Exposed: No Bone Exposed: No Limited to Skin Breakdown Periwound Skin Texture Texture Color No Abnormalities Noted: No No Abnormalities Noted: No Callus: No Atrophie Blanche: No Crepitus: No Cyanosis: No Excoriation: No Ecchymosis: No Fluctuance: No Erythema: No Friable: No Hemosiderin Staining: No Induration: No Mottled: No Localized Edema: Yes Pallor: No Rash: No Rubor: No Scarring: No Temperature / Pain Moisture Temperature: No Abnormality No Abnormalities Noted: No Dry / Scaly: No Maceration: No Moist: No Wound Preparation Ulcer Cleansing: Other: washed with soap and wter, Electronic Signature(s) Signed: 12/23/2014 4:21:14 PM By: Regan Lemming BSN, RN Entered By: Regan Lemming on 12/23/2014 08:41:32 [name] ([id number]) -------------------------------------------------------------------------------- Vitals Details Patient Name: [name]. Date of Service: 12/23/2014 8:15 AM Medical Record Number: [id number] Patient Account Number: [id number] Date of Birth/Sex: 1955/08/12 (59 y.o. Female) Treating RN: Baruch Gouty, RN, BSN, Velva Harman Primary Care Physician: [name] Other Clinician: Referring Physician: [name] Treating Physician/Extender: Frann Rider in Treatment: 90 Vital Signs Time Taken: 08:41 Temperature (F): 98.7 Pulse (bpm): 56 Respiratory Rate (breaths/min): 18 Reference Range: 80 - 120 mg / dl Electronic Signature(s) Signed: 12/23/2014  4:21:14 PM By: Regan Lemming BSN, RN Entered By: Regan Lemming on 12/23/2014 08:42:17

## 2014-12-24 NOTE — Progress Notes (Signed)
CASYN, HEES ([id number]) Visit Report for 12/23/2014 Chief Complaint Document Details Patient Name: [name], [name]. Date of Service: 12/23/2014 8:15 AM Medical Record Number: [id number] Patient Account Number: [id number] Date of Birth/Sex: [birth year] (59 y.o. Female) Treating RN: Primary Care Physician: [name] Other Clinician: Referring Physician: [name] Treating Physician/Extender: Frann Rider in Treatment: 69 Information Obtained from: Patient Chief Complaint Bilateral lower extremity phlebolymphedema and ulcerations since May 2014 (healed on left). Electronic Signature(s) Signed: 12/23/2014 12:30:49 PM By: Christin Fudge MD, FACS Entered By: Christin Fudge on 12/23/2014 09:08:45 [name] ([id number]) -------------------------------------------------------------------------------- HPI Details Patient Name: [name] Date of Service: 12/23/2014 8:15 AM Medical Record Number: [id number] Patient Account Number: [id number] Date of Birth/Sex: 12/16/1955 (59 y.o. Female) Treating RN: Primary Care Physician: [name] Other Clinician: Referring Physician: [name] Treating Physician/Extender: Frann Rider in Treatment: 32 History of Present Illness HPI Description: 59yo w/ h/o BLE phlebolymphedema, type 2 DM, and obesity. No PVD. h/o chronic, recurrent BLE calf ulcers. Tolerating 4 layer compression. Ulcers recently healed, but recurred with juxtalite. Infrequently uses lymphedema pump. Fitted for custom compression stockings but not yet received. She returns to clinic today and is without complaints. No significant pain. Minimal drainage from right calf. No fever or chills. 12/19/2014 -- she was in hospital recently for a UTI and has been on IV and oral antibiotics. Her legs are doing really well and she has no concerns. she has compression stockings ordered but they're still not in. 12/23/2014 -- she is still awaiting her  compression stockings which are custom ordered and also awaiting appointment from the lymphedema clinic. Other than that she is doing very well. Electronic Signature(s) Signed: 12/23/2014 12:30:49 PM By: Christin Fudge MD, FACS Entered By: Christin Fudge on 12/23/2014 [id number] [name] ([id number]) -------------------------------------------------------------------------------- Physical Exam Details Patient Name: [name] Date of Service: 12/23/2014 8:15 AM Medical Record Number: [id number] Patient Account Number: [id number] Date of Birth/Sex: 04/16/1956 (59 y.o. Female) Treating RN: Primary Care Physician: [name] Other Clinician: Referring Physician: [name] Treating Physician/Extender: Frann Rider in Treatment: 90 Constitutional . Pulse regular. Respirations normal and unlabored. Afebrile. . Eyes Nonicteric. Reactive to light. Ears, Nose, Mouth, and Throat Lips, teeth, and gums WNL.Marland Kitchen Moist mucosa without lesions . Neck supple and nontender. No palpable supraclavicular or cervical adenopathy. Normal sized without goiter. Respiratory WNL. No retractions.. Cardiovascular Pedal Pulses WNL. No clubbing, cyanosis or edema. Musculoskeletal Adexa without tenderness or enlargement.. Digits and nails w/o clubbing, cyanosis, infection, petechiae, ischemia, or inflammatory conditions.. Integumentary (Hair, Skin) No suspicious lesions. edema well controlled and no open ulcerations on both lower extremities.. No crepitus or fluctuance. No peri-wound warmth or erythema. No masses.Marland Kitchen Psychiatric Judgement and insight Intact.. No evidence of depression, anxiety, or agitation.. Electronic Signature(s) Signed: 12/23/2014 12:30:49 PM By: Christin Fudge MD, FACS Entered By: Christin Fudge on 12/23/2014 09:10:00 [name] ([id number]) -------------------------------------------------------------------------------- Physician Orders Details Patient Name:  [name] Date of Service: 12/23/2014 8:15 AM Medical Record Number: [id number] Patient Account Number: [id number] Date of Birth/Sex: Nov 12, 1955 (58 y.o. Female) Treating RN: Baruch Gouty, RN, BSN, Velva Harman Primary Care Physician: [name] Other Clinician: Referring Physician: [name] Treating Physician/Extender: Frann Rider in Treatment: 67 Verbal / Phone Orders: Yes Clinician: Afful, RN, BSN, Rita Read Back and Verified: Yes Diagnosis Coding Secondary Dressing o Conform/Kerlix - to build up ankle Follow-up Appointments o Return Appointment in 1 week. Edema Control o 4 Layer Compression System - Bilateral o Elevate legs to  the level of the heart and pump ankles as often as possible Electronic Signature(s) Signed: 12/23/2014 12:30:49 PM By: Christin Fudge MD, FACS Signed: 12/23/2014 4:21:14 PM By: Regan Lemming BSN, RN Entered By: Regan Lemming on 12/23/2014 09:06:05 [name] ([id number]) -------------------------------------------------------------------------------- Problem List Details Patient Name: [name]. Date of Service: 12/23/2014 8:15 AM Medical Record Number: [id number] Patient Account Number: [id number] Date of Birth/Sex: [birth year] (59 y.o. Female) Treating RN: Primary Care Physician: [name] Other Clinician: Referring Physician: [name] Treating Physician/Extender: Frann Rider in Treatment: 57 Active Problems ICD-9 Encounter Code Description Active Date Diagnosis 457.1 Other Lymphedema - excludes congenital, eyelid or vulva 03/27/2013 Yes 459.33 Chronic venous hypertension with ulcer and inflammation 03/27/2013 Yes 707.12 Ulcer of lower limbs, except pressure ulcer; Ulcer of calf 03/27/2013 Yes ICD-10 Encounter Code Description Active Date Diagnosis I89.0 Lymphedema, not elsewhere classified 03/26/2014 Yes E66.9 Obesity, unspecified 03/26/2014 Yes 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 08/27/2014 Yes E11.621 Type 2 diabetes mellitus with foot ulcer 10/08/2014 Yes Inactive Problems Resolved Problems ICD-10 Code Description Active Date Resolved Date I83.222 Varicose veins of left lower extremity with both ulcer of 11/12/2014 11/12/2014 calf and inflammation [name], [name] ([id number]) L84 Corns and callosities 11/12/2014 11/12/2014 Electronic Signature(s) Signed: 12/23/2014 12:30:49 PM By: Christin Fudge MD, FACS Entered By: Christin Fudge on 12/23/2014 09:08:35 [name] ([id number]) -------------------------------------------------------------------------------- Progress Note Details Patient Name: [name]. Date of Service: 12/23/2014 8:15 AM Medical Record Number: [id number] Patient Account Number: [id number] Date of Birth/Sex: 1956/06/05 (59 y.o. Female) Treating RN: Primary Care Physician: [name] Other Clinician: Referring Physician: [name] Treating Physician/Extender: Frann Rider in Treatment: 65 Subjective Chief Complaint Information obtained from Patient Bilateral lower extremity phlebolymphedema and ulcerations since May 2014 (healed on left). History of Present Illness (HPI) 59yo w/ h/o BLE phlebolymphedema, type 2 DM, and obesity. No PVD. h/o chronic, recurrent BLE calf ulcers. Tolerating 4 layer compression. Ulcers recently healed, but recurred with juxtalite. Infrequently uses lymphedema pump. Fitted for custom compression stockings but not yet received. She returns to clinic today and is without complaints. No significant pain. Minimal drainage from right calf. No fever or chills. 12/19/2014 -- she was in hospital recently for a UTI and has been on IV and oral antibiotics. Her legs are doing really well and she has no concerns. she has compression stockings ordered but they're still not in. 12/23/2014 -- she is still awaiting her compression stockings which are custom ordered and also awaiting appointment  from the lymphedema clinic. Other than that she is doing very well. Objective Constitutional Pulse regular. Respirations normal and unlabored. Afebrile. Vitals Time Taken: 8:41 AM, Temperature: 98.7 F, Pulse: 56 bpm, Respiratory Rate: 18 breaths/min. Eyes Nonicteric. Reactive to light. Ears, Nose, Mouth, and Throat Lips, teeth, and gums WNL.Marland Kitchen Moist mucosa without lesions . [name], [name] ([id number]) Neck supple and nontender. No palpable supraclavicular or cervical adenopathy. Normal sized without goiter. Respiratory WNL. No retractions.. Cardiovascular Pedal Pulses WNL. No clubbing, cyanosis or edema. Musculoskeletal Adexa without tenderness or enlargement.. Digits and nails w/o clubbing, cyanosis, infection, petechiae, ischemia, or inflammatory conditions.Marland Kitchen Psychiatric Judgement and insight Intact.. No evidence of depression, anxiety, or agitation.. Integumentary (Hair, Skin) No suspicious lesions. edema well controlled and no open ulcerations on both lower extremities.. No crepitus or fluctuance. No peri-wound warmth or erythema. No masses.. Wound #50 status is Healed - Epithelialized. Original cause of wound was Blister. The wound is located on the Right,Proximal,Lateral Lower  Leg. The wound measures 0cm length x 0cm width x 0cm depth; 0cm^2 area and 0cm^3 volume. The wound is limited to skin breakdown. There is no tunneling or undermining noted. There is a none present amount of drainage noted. There is no granulation within the wound bed. There is no necrotic tissue within the wound bed. The periwound skin appearance exhibited: Localized Edema. The periwound skin appearance did not exhibit: Callus, Crepitus, Excoriation, Fluctuance, Friable, Induration, Rash, Scarring, Dry/Scaly, Maceration, Moist, Atrophie Blanche, Cyanosis, Ecchymosis, Hemosiderin Staining, Mottled, Pallor, Rubor, Erythema. Periwound temperature was noted as No Abnormality. 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 ([id number]) Her custom made compression stockings have still not arrived and hence we will continue with a 4-layer wrap. We are awaiting an appointment for the lymphedema clinic and she is going to use her lymphedema pumps at home twice a day. She will come and see Dr. Quay Burow next week. Plan Secondary Dressing: Conform/Kerlix - to build up ankle Follow-up Appointments: Return Appointment in 1 week. Edema Control: 4 Layer Compression System - Bilateral Elevate legs to the level of the heart and pump ankles as often as possible Her custom made compression stockings have still not arrived and hence we will continue with a 4-layer wrap. We are awaiting an appointment for the lymphedema clinic and she is going to use her lymphedema pumps at home twice a day. She will come and see Dr. Quay Burow next week. Electronic Signature(s) Signed: 12/23/2014 12:30:49 PM By: Christin Fudge MD, FACS Entered By: Christin Fudge on 12/23/2014 09:12:19 [name] ([id number]) -------------------------------------------------------------------------------- SuperBill Details Patient Name: [name] Date of Service: 12/23/2014 Medical Record Number: [id number] Patient Account Number: [id number] Date of Birth/Sex: July 15, 1955 (59 y.o. 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 Physician Procedures CPT4 Code: [id number] Description: [id number] - WC PHYS LEVEL 3 - EST PT ICD-10 Description Diagnosis I89.0 Lymphedema, not elsewhere classified I83.012 Varicose veins of right lower extremity with ulcer E11.621 Type 2 diabetes mellitus with foot ulcer Modifier: of calf Quantity: 1 Electronic Signature(s) Signed: 12/23/2014 12:30:49 PM By: Christin Fudge MD, FACS Entered By: Christin Fudge on 12/23/2014 09:12:41

## 2014-12-31 ENCOUNTER — Encounter: Payer: Managed Care, Other (non HMO) | Attending: Surgery | Admitting: Surgery

## 2014-12-31 DIAGNOSIS — I890 Lymphedema, not elsewhere classified: Secondary | ICD-10-CM | POA: Insufficient documentation

## 2014-12-31 DIAGNOSIS — E119 Type 2 diabetes mellitus without complications: Secondary | ICD-10-CM | POA: Insufficient documentation

## 2014-12-31 DIAGNOSIS — E669 Obesity, unspecified: Secondary | ICD-10-CM | POA: Diagnosis not present

## 2015-01-02 NOTE — Progress Notes (Signed)
[name], [name] ([id number]) Visit Report for 12/19/2014 Chief Complaint Document Details Patient Name: [name], [name]. Date of Service: 12/19/2014 8:00 AM Medical Record Number: [id number] Patient Account Number: [id number] Date of Birth/Sex: [birth year] (59 y.o. Female) Treating RN: Primary Care Physician: [name] Other Clinician: Referring Physician: Otilio [name] Treating Physician/Extender: [name], [name] in Treatment: 90 Information Obtained from: Patient Chief Complaint Bilateral lower extremity phlebolymphedema and ulcerations since May 2014 (healed on left). Electronic Signature(s) Signed: 12/19/2014 10:16:48 AM By: Autumn Fudge MD, FACS Signed: 01/02/2015 8:00:54 AM By: Autumn Grayer MD Entered By: [name] on 12/19/2014 08:57:07 [name] ([id number]) -------------------------------------------------------------------------------- HPI Details Patient Name: [name] Date of Service: 12/19/2014 8:00 AM Medical Record Number: [id number] Patient Account Number: [id number] Date of Birth/Sex: [birth year] (59 y.o. Female) Treating RN: Primary Care Physician: [name] Other Clinician: Referring Physician: Otilio [name] Treating Physician/Extender: [name], [name] [name] in Treatment: 90 History of Present Illness HPI Description: 59yo w/ h/o BLE phlebolymphedema, type 2 DM, and obesity. No PVD. h/o chronic, recurrent BLE calf ulcers. Tolerating 4 layer compression. Ulcers recently healed, but recurred with juxtalite. Infrequently uses lymphedema pump. Fitted for custom compression stockings but not yet received. She returns to clinic today and is without complaints. No significant pain. Minimal drainage from right calf. No fever or chills. 12/19/2014 -- she was in hospital recently for a UTI and has been on IV and oral antibiotics. Her legs are doing really well and she has no concerns. she has compression stockings ordered but  they're still not in. Electronic Signature(s) Signed: 12/19/2014 10:16:48 AM By: Autumn Fudge MD, FACS Signed: 01/02/2015 8:00:54 AM By: Autumn Grayer MD Entered By: [name] on 12/19/2014 08:58:09 [name] ([id number]) -------------------------------------------------------------------------------- Physical Exam Details Patient Name: [name]. Date of Service: 12/19/2014 8:00 AM Medical Record Number: [id number] Patient Account Number: [id number] Date of Birth/Sex: 06/29/1955 (59 y.o. Female) Treating RN: Primary Care Physician: [name] Other Clinician: Referring Physician: Otilio [name] Treating Physician/Extender: [name], [name] [name] in Treatment: 90 Constitutional . Pulse regular. Respirations normal and unlabored. Afebrile. . Eyes Nonicteric. Reactive to light. Ears, Nose, Mouth, and Throat Lips, teeth, and gums WNL.Marland Kitchen Moist mucosa without lesions . Neck supple and nontender. No palpable supraclavicular or cervical adenopathy. Normal sized without goiter. Respiratory WNL. No retractions.. Cardiovascular Pedal Pulses WNL. No clubbing, cyanosis or edema. Chest Breasts symmetical and no nipple discharge.. the lymphedema of the lower part below the knee is controlled very well but she has massive lymphedema whether is no compression.. Musculoskeletal Adexa without tenderness or enlargement.. Digits and nails w/o clubbing, cyanosis, infection, petechiae, ischemia, or inflammatory conditions.. Integumentary (Hair, Skin) No suspicious lesions. No open ulcerations on both legs.. No crepitus or fluctuance. No peri-wound warmth or erythema. No masses.Marland Kitchen Psychiatric Judgement and insight Intact.. No evidence of depression, anxiety, or agitation.. Electronic Signature(s) Signed: 12/19/2014 10:16:48 AM By: Autumn Fudge MD, FACS Signed: 01/02/2015 8:00:54 AM By: Autumn Grayer MD Entered By: [name] on 12/19/2014 08:59:07 [name]  ([id number]) -------------------------------------------------------------------------------- Physician Orders Details Patient Name: [name] Date of Service: 12/19/2014 8:00 AM Medical Record Number: [id number] Patient Account Number: [id number] Date of Birth/Sex: 1955/07/21 (59 y.o. Female) Treating RN: [name] Primary Care Physician: [name] Other Clinician: Referring Physician: Otilio [name] Treating Physician/Extender: [name], [name] in Treatment: 51 Verbal / Phone Orders: Yes Clinician: Montey [name] Read Back and Verified: Yes Diagnosis Coding Wound Cleansing Wound #50 Right,Proximal,Lateral Lower Leg o  Cleanse wound with mild soap and water o May shower with protection. o No tub bath. Skin Barriers/Peri-Wound Care Wound #50 Right,Proximal,Lateral Lower Leg o Barrier cream o Moisturizing lotion Primary Wound Dressing Wound #50 Right,Proximal,Lateral Lower Leg o Iodoflex Secondary Dressing o Conform/Kerlix - to build up ankle Wound #50 Right,Proximal,Lateral Lower Leg o ABD pad Dressing Change Frequency Wound #50 Right,Proximal,Lateral Lower Leg o Change dressing every week Follow-up Appointments Wound #50 Right,Proximal,Lateral Lower Leg o Return Appointment in 1 week. Edema Control Wound #50 Right,Proximal,Lateral Lower Leg o 4 Layer Compression System - Bilateral Notes refer to lymphedema clinic [name] ([id number]) Electronic Signature(s) Signed: 12/19/2014 3:37:55 PM By: [name] Signed: 01/02/2015 8:00:54 AM By: Autumn Grayer MD Entered By: [name] on 12/19/2014 08:54:44 [name] ([id number]) -------------------------------------------------------------------------------- Problem List Details Patient Name: [name]. Date of Service: 12/19/2014 8:00 AM Medical Record Number: [id number] Patient Account Number: [id number] Date of Birth/Sex: [birth year] (59 y.o.  Female) Treating RN: Primary Care Physician: [name] Other Clinician: Referring Physician: Otilio [name] Treating Physician/Extender: [name], [name] in Treatment: 90 Active Problems ICD-9 Encounter Code Description Active Date Diagnosis 457.1 Other Lymphedema - excludes congenital, eyelid or vulva 03/27/2013 Yes 459.33 Chronic venous hypertension with ulcer and inflammation 03/27/2013 Yes 707.12 Ulcer of lower limbs, except pressure ulcer; Ulcer of calf 03/27/2013 Yes ICD-10 Encounter Code Description Active Date Diagnosis I89.0 Lymphedema, not elsewhere classified 03/26/2014 Yes E66.9 Obesity, unspecified 03/26/2014 Yes 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 08/27/2014 Yes E11.621 Type 2 diabetes mellitus with foot ulcer 10/08/2014 Yes Inactive Problems Resolved Problems ICD-10 Code Description Active Date Resolved Date I83.222 Varicose veins of left lower extremity with both ulcer of 11/12/2014 11/12/2014 calf and inflammation [name], [name] ([id number]) L84 Corns and callosities 11/12/2014 11/12/2014 Electronic Signature(s) Signed: 12/19/2014 10:16:48 AM By: Autumn Fudge MD, FACS Signed: 01/02/2015 8:00:54 AM By: Autumn Grayer MD Entered By: [name] on 12/19/2014 08:56:59 [name] ([id number]) -------------------------------------------------------------------------------- Progress Note Details Patient Name: [name]. Date of Service: 12/19/2014 8:00 AM Medical Record Number: [id number] Patient Account Number: [id number] Date of Birth/Sex: 05/16/1956 (59 y.o. Female) Treating RN: Primary Care Physician: [name] Other Clinician: Referring Physician: Otilio [name] Treating Physician/Extender: [name], [name] in Treatment: 32 Subjective Chief Complaint Information obtained from Patient Bilateral lower extremity phlebolymphedema and ulcerations since May 2014 (healed on left). History of  Present Illness (HPI) 59yo w/ h/o BLE phlebolymphedema, type 2 DM, and obesity. No PVD. h/o chronic, recurrent BLE calf ulcers. Tolerating 4 layer compression. Ulcers recently healed, but recurred with juxtalite. Infrequently uses lymphedema pump. Fitted for custom compression stockings but not yet received. She returns to clinic today and is without complaints. No significant pain. Minimal drainage from right calf. No fever or chills. 12/19/2014 -- she was in hospital recently for a UTI and has been on IV and oral antibiotics. Her legs are doing really well and she has no concerns. she has compression stockings ordered but they're still not in. Objective Constitutional Pulse regular. Respirations normal and unlabored. Afebrile. Vitals Time Taken: 8:52 AM, Temperature: 98.5 F, Pulse: 46 bpm, Respiratory Rate: 18 breaths/min, Blood Pressure: 145/69 mmHg. Eyes Nonicteric. Reactive to light. Ears, Nose, Mouth, and Throat Lips, teeth, and gums WNL.Marland Kitchen Moist mucosa without lesions . Neck [name], [name] ([id number]) supple and nontender. No palpable supraclavicular or cervical adenopathy. Normal sized without goiter. Respiratory WNL. No retractions.. Cardiovascular Pedal Pulses WNL. No clubbing, cyanosis or edema. Chest  Breasts symmetical and no nipple discharge.. the lymphedema of the lower part below the knee is controlled very well but she has massive lymphedema whether is no compression.. Musculoskeletal Adexa without tenderness or enlargement.. Digits and nails w/o clubbing, cyanosis, infection, petechiae, ischemia, or inflammatory conditions.Marland Kitchen Psychiatric Judgement and insight Intact.. No evidence of depression, anxiety, or agitation.. Integumentary (Hair, Skin) No suspicious lesions. No open ulcerations on both legs.. No crepitus or fluctuance. No peri-wound warmth or erythema. No masses.. Wound #50 status is Open. Original cause of wound was Blister. The wound is located on  the Right,Proximal,Lateral Lower Leg. The wound measures 0cm length x 0cm width x 0cm depth; 0cm^2 area and 0cm^3 volume. 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 ([id number]) She has made good progress over the last 10 days and this is the time we will referred her to the lymphedema clinic back again as she has no open ulcers. We will continue with a 4-layer compression 10 she gets her compression stockings and have urged her to use the lymphedema pump him twice a day. She'll come and see Dr. Quay Burow next week. Plan Wound Cleansing: Wound #50 Right,Proximal,Lateral Lower Leg: Cleanse wound with mild soap and water May shower with protection. No tub bath. Skin Barriers/Peri-Wound Care: Wound #50 Right,Proximal,Lateral Lower Leg: Barrier cream Moisturizing lotion Primary Wound Dressing: Wound #50 Right,Proximal,Lateral Lower Leg: Iodoflex Secondary Dressing: Conform/Kerlix - to build up ankle Wound #50 Right,Proximal,Lateral Lower Leg: ABD pad Dressing Change Frequency: Wound #50 Right,Proximal,Lateral Lower Leg: Change dressing every week Follow-up Appointments: Wound #50 Right,Proximal,Lateral Lower Leg: Return Appointment in 1 week. Edema Control: Wound #50 Right,Proximal,Lateral Lower Leg: 4 Layer Compression System - Bilateral General Notes: refer to lymphedema clinic She has made good progress over the last 10 days and this is the time we will referred her to the lymphedema clinic back again as she has no open ulcers. We will continue with a 4-layer compression  10 she gets her compression stockings and have urged her to use the lymphedema pump him twice a day. She'll come and see Dr. Quay Burow next week. Electronic Signature(s) Signed: 12/19/2014 10:16:48 AM By: Autumn Fudge MD, FACS Signed: 01/02/2015 8:00:54 AM By: Autumn Grayer MD [name] ([id number]) Entered By: [name] on 12/19/2014 09:00:13 [name] ([id number]) -------------------------------------------------------------------------------- SuperBill Details Patient Name: [name] Date of Service: 12/19/2014 Medical Record Number: [id number] Patient Account Number: [id number] Date of Birth/Sex: 1955-12-02 (60 y.o. 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 Physician Procedures CPT4 Code:  [id number] Description: 99213 - WC PHYS LEVEL 3 - EST PT ICD-10 Description Diagnosis I89.0 Lymphedema, not elsewhere classified E66.9 Obesity, unspecified I83.012 Varicose veins of right lower extremity with ulcer Modifier: of calf Quantity: 1 Electronic Signature(s) Signed: 12/19/2014 10:16:48 AM By: Autumn Fudge MD, FACS Signed: 01/02/2015 8:00:54 AM By: Autumn Grayer  MD Entered By: [name] on 12/19/2014 09:00:51

## 2015-01-02 NOTE — Progress Notes (Signed)
[name], [name] ([id number]) Visit Report for 12/19/2014 Arrival Information Details Patient Name: [name], [name]. Date of Service: 12/19/2014 8:00 AM Medical Record Number: [id number] Patient Account Number: [id number] Date of Birth/Sex: 1956-02-26 (59 y.o. Female) Treating RN: Montey Hora Primary Care Physician: [name] Other Clinician: Referring Physician: [name] Treating Physician/Extender: BURNS III, Charlean Sanfilippo in Treatment: 90 Visit Information History Since Last Visit Added or deleted any medications: No Patient Arrived: Wheel Chair Any new allergies or adverse reactions: No Arrival Time: 08:10 Had a fall or experienced change in No Accompanied By: self activities of daily living that may affect Transfer Assistance: None risk of falls: Patient Identification Verified: Yes Signs or symptoms of abuse/neglect since last No Secondary Verification Process Yes visito Completed: Hospitalized since last visit: No Patient Requires Transmission- No Pain Present Now: No Based Precautions: Patient Has Alerts: Yes Patient Alerts: Patient on Blood Thinner Electronic Signature(s) Signed: 12/19/2014 3:37:55 PM By: Montey Hora Entered By: Montey Hora on 12/19/2014 08:19:23 [name] ([id number]) -------------------------------------------------------------------------------- Encounter Discharge Information Details Patient Name: [name]. Date of Service: 12/19/2014 8:00 AM Medical Record Number: [id number] Patient Account Number: [id number] Date of Birth/Sex: [birth year] (59 y.o. Female) Treating RN: Montey Hora Primary Care Physician: [name] Other Clinician: Referring Physician: [name] Treating Physician/Extender: BURNS III, Charlean Sanfilippo in Treatment: 37 Encounter Discharge Information Items Discharge Pain Level: 0 Discharge Condition: Stable Ambulatory Status: Wheelchair Discharge Destination: Home Transportation:  Private Auto Accompanied By: self Schedule Follow-up Appointment: Yes Medication Reconciliation completed and provided to Patient/Care No Francis Yardley: Provided on Clinical Summary of Care: 12/19/2014 Form Type Recipient Paper Patient GI Electronic Signature(s) Signed: 12/19/2014 9:26:35 AM By: Ruthine Dose Entered By: Ruthine Dose on 12/19/2014 09:26:35 [name] ([id number]) -------------------------------------------------------------------------------- Lower Extremity Assessment Details Patient Name: [name] Date of Service: 12/19/2014 8:00 AM Medical Record Number: [id number] Patient Account Number: [id number] Date of Birth/Sex: March 20, 1956 (59 y.o. Female) Treating RN: Montey Hora Primary Care Physician: [name] Other Clinician: Referring Physician: [name] Treating Physician/Extender: BURNS III, Charlean Sanfilippo in Treatment: 90 Edema Assessment Assessed: [Left: No] [Right: No] E[Left: dema] [Right: :] Calf Left: Right: Point of Measurement: 24 cm From Medial Instep 42.6 cm 37.8 cm Ankle Left: Right: Point of Measurement: 8 cm From Medial Instep 24.9 cm 22.8 cm Vascular Assessment Pulses: Posterior Tibial Palpable: [Left:Yes] [Right:Yes] Dorsalis Pedis Palpable: [Left:Yes] [Right:Yes] Extremity colors, hair growth, and conditions: Extremity Color: [Left:Hyperpigmented] [Right:Hyperpigmented] Hair Growth on Extremity: [Left:No] [Right:No] Temperature of Extremity: [Left:Warm] [Right:Warm] Capillary Refill: [Left:< 3 seconds] [Right:< 3 seconds] Toe Nail Assessment Left: Right: Thick: Yes Yes Discolored: Yes Yes Deformed: Yes Yes Improper Length and Hygiene: Yes Yes Electronic Signature(s) Signed: 12/19/2014 3:37:55 PM By: Montey Hora Entered By: Montey Hora on 12/19/2014 08:46:49 [name] ([id number]) [name], [name]  ([id number]) -------------------------------------------------------------------------------- Multi Wound Chart Details Patient Name: [name]. Date of Service: 12/19/2014 8:00 AM Medical Record Number: [id number] Patient Account Number: [id number] Date of Birth/Sex: 1955/12/03 (59 y.o. Female) Treating RN: Montey Hora Primary Care Physician: [name] Other Clinician: Referring Physician: [name] Treating Physician/Extender: BURNS III, WALTER Weeks in Treatment: 90 Vital Signs Height(in): Pulse(bpm): 46 Weight(lbs): Blood Pressure 145/69 (mmHg): Body Mass Index(BMI): Temperature(F): 98.5 Respiratory Rate 18 (breaths/min): Photos: [50:No Photos] [N/A:N/A] Wound Location: [50:Right, Proximal, Lateral Lower Leg] [N/A:N/A] Wounding Event: [50:Blister] [N/A:N/A] Primary Etiology: [50:Venous Leg Ulcer] [N/A:N/A] Date Acquired: [50:11/14/2014] [N/A:N/A] Weeks of Treatment: [50:4] [N/A:N/A] Wound Status: [50:Open] [N/A:N/A] Measurements L x W x D 0x0x0 [N/A:N/A] (cm)  Area (cm) : [50:0] [N/A:N/A] Volume (cm) : [50:0] [N/A:N/A] % Reduction in Area: [50:100.00%] [N/A:N/A] % Reduction in Volume: 100.00% [N/A:N/A] Classification: [50:Partial Thickness] [N/A:N/A] Periwound Skin Texture: No Abnormalities Noted [N/A:N/A] Periwound Skin [50:No Abnormalities Noted] [N/A:N/A] Moisture: Periwound Skin Color: No Abnormalities Noted [N/A:N/A] Tenderness on [50:No] [N/A:N/A] Treatment Notes Electronic Signature(s) Signed: 12/19/2014 3:37:55 PM By: Montey Hora Entered By: Montey Hora on 12/19/2014 08:53:49 [name] ([id number]) [name], [name] ([id number]) -------------------------------------------------------------------------------- Multi-Disciplinary Care Plan Details Patient Name: [name]. Date of Service: 12/19/2014 8:00 AM Medical Record Number: [id number] Patient Account Number: [id number] Date of Birth/Sex: 12-Oct-1955 (59 y.o.  Female) Treating RN: Montey Hora Primary Care Physician: [name] Other Clinician: Referring Physician: [name] Treating Physician/Extender: BURNS III, Charlean Sanfilippo in Treatment: 68 Active Inactive Nutrition Nursing Diagnoses: Impaired glucose control: actual or potential Goals: Patient/caregiver verbalizes understanding of need to maintain therapeutic glucose control per primary care physician Date Initiated: 12/16/2013 Goal Status: Active Interventions: Provide education on elevated blood sugars and impact on wound healing Notes: Soft Tissue Infection Nursing Diagnoses: Impaired tissue integrity Goals: Patient will remain free of wound infection Date Initiated: 12/16/2013 Goal Status: Active Interventions: Assess signs and symptoms of infection every visit Notes: Venous Leg Ulcer Nursing Diagnoses: Knowledge deficit related to disease process and management Potential for venous Insuffiency (use before diagnosis confirmed) GoalsMALAJA, [name] ([id number]) Patient will maintain optimal edema control Date Initiated: 08/18/2014 Goal Status: Active Interventions: Assess peripheral edema status every visit. Compression as ordered Treatment Activities: Therapeutic compression applied : 12/19/2014 Notes: Electronic Signature(s) Signed: 12/19/2014 3:37:55 PM By: Montey Hora Entered By: Montey Hora on 12/19/2014 08:53:41 [name] ([id number]) -------------------------------------------------------------------------------- Patient/Caregiver Education Details Patient Name: [name] Date of Service: 12/19/2014 8:00 AM Medical Record Number: [id number] Patient Account Number: [id number] Date of Birth/Gender: Sep 09, 1955 (59 y.o. Female) Treating RN: Montey Hora Primary Care Physician: [name] Other Clinician: Referring Physician: [name] Treating Physician/Extender: Frann Rider in Treatment: 5 Education  Assessment Education Provided To: Patient Education Topics Provided Wound/Skin Impairment: Handouts: Other: referral made to lymphedema clinic Methods: Explain/Verbal Responses: State content correctly Electronic Signature(s) Signed: 12/19/2014 3:37:55 PM By: Montey Hora Entered By: Montey Hora on 12/19/2014 09:19:23 [name] ([id number]) -------------------------------------------------------------------------------- Wound Assessment Details Patient Name: [name]. Date of Service: 12/19/2014 8:00 AM Medical Record Number: [id number] Patient Account Number: [id number] Date of Birth/Sex: Dec 10, 1955 (59 y.o. Female) Treating RN: Montey Hora Primary Care Physician: [name] Other Clinician: Referring Physician: [name] Treating Physician/Extender: BURNS III, WALTER Weeks in Treatment: 90 Wound Status Wound Number: 50 Primary Etiology: Venous Leg Ulcer Wound Location: Right, Proximal, Lateral Lower Wound Status: Open Leg Wounding Event: Blister Date Acquired: 11/14/2014 Weeks Of Treatment: 4 Clustered Wound: No Photos Photo Uploaded By: Montey Hora on 12/19/2014 15:15:15 Wound Measurements Length: (cm) 0 % Reduction Width: (cm) 0 % Reduction Depth: (cm) 0 Area: (cm) 0 Volume: (cm) 0 in Area: 100% in Volume: 100% Wound Description Classification: Partial Thickness Periwound Skin Texture Texture Color No Abnormalities Noted: No No Abnormalities Noted: No Moisture No Abnormalities Noted: No Electronic Signature(s) Signed: 12/19/2014 3:37:55 PM By: Margarita Grizzle, [name] ([id number]) Entered By: Montey Hora on 12/19/2014 08:47:23 [name] ([id number]) -------------------------------------------------------------------------------- Vitals Details Patient Name: [name]. Date of Service: 12/19/2014 8:00 AM Medical Record Number: [id number] Patient Account Number: [id number] Date of Birth/Sex:  05-Jul-1955 (59 y.o. Female) Treating RN: Montey Hora Primary Care Physician: [name] Other Clinician: Referring Physician: [name] Treating  Physician/Extender: BURNS III, WALTER Weeks in Treatment: 90 Vital Signs Time Taken: 08:52 Temperature (F): 98.5 Pulse (bpm): 46 Respiratory Rate (breaths/min): 18 Blood Pressure (mmHg): 145/69 Reference Range: 80 - 120 mg / dl Electronic Signature(s) Signed: 12/19/2014 3:37:55 PM By: Montey Hora Entered By: Montey Hora on 12/19/2014 08:52:56

## 2015-01-02 NOTE — Progress Notes (Signed)
[name], [name] ([id number]) Visit Report for 12/31/2014 Arrival Information Details Patient Name: [name], [name]. Date of Service: 12/31/2014 8:00 AM Medical Record Number: [id number] Patient Account Number: [id number] Date of Birth/Sex: 1955/10/20 (59 y.o. Female) Treating RN: Montey Hora Primary Care Physician: [name] Other Clinician: Referring Physician: [name] Treating Physician/Extender: BURNS III, Charlean Sanfilippo in Treatment: 64 Visit Information History Since Last Visit Added or deleted any medications: No Patient Arrived: Wheel Chair Any new allergies or adverse reactions: No Arrival Time: 08:20 Had a fall or experienced change in No Accompanied By: self activities of daily living that may affect Transfer Assistance: None risk of falls: Patient Identification Verified: Yes Signs or symptoms of abuse/neglect since last No Secondary Verification Process Yes visito Completed: Hospitalized since last visit: No Patient Requires Transmission- No Pain Present Now: No Based Precautions: Patient Has Alerts: Yes Patient Alerts: Patient on Blood Thinner Electronic Signature(s) Signed: 01/01/2015 5:13:09 PM By: Montey Hora Entered By: Montey Hora on 12/31/2014 08:21:02 [name] ([id number]) -------------------------------------------------------------------------------- Encounter Discharge Information Details Patient Name: [name]. Date of Service: 12/31/2014 8:00 AM Medical Record Number: [id number] Patient Account Number: [id number] Date of Birth/Sex: Nov 15, 1955 (59 y.o. Female) Treating RN: Montey Hora Primary Care Physician: [name] Other Clinician: Referring Physician: [name] Treating Physician/Extender: BURNS III, Charlean Sanfilippo in Treatment: 4 Encounter Discharge Information Items Discharge Pain Level: 0 Discharge Condition: Stable Ambulatory Status: Wheelchair Discharge Destination: Home Transportation:  Private Auto Accompanied By: self Schedule Follow-up Appointment: Yes Medication Reconciliation completed No and provided to Patient/Care [name]: Provided on Clinical Summary of Care: 12/31/2014 Form Type Recipient Paper Patient GI Electronic Signature(s) Signed: 12/31/2014 4:32:02 PM By: Ruthine Dose Entered By: Ruthine Dose on 12/31/2014 16:32:02 [name] ([id number]) -------------------------------------------------------------------------------- Lower Extremity Assessment Details Patient Name: [name] Date of Service: 12/31/2014 8:00 AM Medical Record Number: [id number] Patient Account Number: [id number] Date of Birth/Sex: 1956/05/07 (59 y.o. Female) Treating RN: Montey Hora Primary Care Physician: [name] Other Clinician: Referring Physician: [name] Treating Physician/Extender: BURNS III, Charlean Sanfilippo in Treatment: 92 Edema Assessment Assessed: [Left: No] [Right: No] E[Left: dema] [Right: :] Calf Left: Right: Point of Measurement: 24 cm From Medial Instep 43.4 cm 38.7 cm Ankle Left: Right: Point of Measurement: 8 cm From Medial Instep 23.3 cm 22.4 cm Vascular Assessment Pulses: Posterior Tibial Palpable: [Left:Yes] [Right:Yes] Dorsalis Pedis Palpable: [Left:Yes] [Right:Yes] Extremity colors, hair growth, and conditions: Extremity Color: [Left:Hyperpigmented] [Right:Hyperpigmented] Hair Growth on Extremity: [Left:No] [Right:No] Temperature of Extremity: [Left:Warm] [Right:Warm] Capillary Refill: [Left:< 3 seconds] [Right:< 3 seconds] Toe Nail Assessment Left: Right: Thick: Yes Yes Discolored: Yes Yes Deformed: Yes Yes Improper Length and Hygiene: Yes Yes Electronic Signature(s) Signed: 01/01/2015 5:13:09 PM By: Montey Hora Entered By: Montey Hora on 12/31/2014 08:32:59 [name] ([id number]) [name], [name] ([id number]) -------------------------------------------------------------------------------- Multi  Wound Chart Details Patient Name: [name]. Date of Service: 12/31/2014 8:00 AM Medical Record Number: [id number] Patient Account Number: [id number] Date of Birth/Sex: 1956/02/15 (59 y.o. Female) Treating RN: Montey Hora Primary Care Physician: [name] Other Clinician: Referring Physician: [name] Treating Physician/Extender: BURNS III, WALTER Weeks in Treatment: 92 Vital Signs Height(in): Pulse(bpm): 51 Weight(lbs): Blood Pressure 136/49 (mmHg): Body Mass Index(BMI): Temperature(F): 98.3 Respiratory Rate 18 (breaths/min): Wound Assessments Treatment Notes Electronic Signature(s) Signed: 01/01/2015 5:13:09 PM By: Montey Hora Entered By: Montey Hora on 12/31/2014 08:41:07 [name] ([id number]) -------------------------------------------------------------------------------- Multi-Disciplinary Care Plan Details Patient Name: [name] Date of Service: 12/31/2014 8:00 AM Medical Record Number: [id number]  Patient Account Number: [id number] Date of Birth/Sex: [birth year] (59 y.o. Female) Treating RN: Montey Hora Primary Care Physician: [name] Other Clinician: Referring Physician: [name] Treating Physician/Extender: BURNS III, Charlean Sanfilippo in Treatment: 27 Active Inactive Nutrition Nursing Diagnoses: Impaired glucose control: actual or potential Goals: Patient/caregiver verbalizes understanding of need to maintain therapeutic glucose control per primary care physician Date Initiated: 12/16/2013 Goal Status: Active Interventions: Provide education on elevated blood sugars and impact on wound healing Notes: Soft Tissue Infection Nursing Diagnoses: Impaired tissue integrity Goals: Patient will remain free of wound infection Date Initiated: 12/16/2013 Goal Status: Active Interventions: Assess signs and symptoms of infection every visit Notes: Venous Leg Ulcer Nursing Diagnoses: Knowledge deficit related to  disease process and management Potential for venous Insuffiency (use before diagnosis confirmed) GoalsROSELEA, [name] ([id number]) Patient will maintain optimal edema control Date Initiated: 08/18/2014 Goal Status: Active Interventions: Assess peripheral edema status every visit. Compression as ordered Treatment Activities: Therapeutic compression applied : 12/31/2014 Notes: Electronic Signature(s) Signed: 01/01/2015 5:13:09 PM By: Montey Hora Entered By: Montey Hora on 12/31/2014 08:41:01 [name] ([id number]) -------------------------------------------------------------------------------- Patient/Caregiver Education Details Patient Name: [name] Date of Service: 12/31/2014 8:00 AM Medical Record Number: [id number] Patient Account Number: [id number] Date of Birth/Gender: April 16, 1956 (59 y.o. Female) Treating RN: Montey Hora Primary Care Physician: [name] Other Clinician: Referring Physician: [name] Treating Physician/Extender: BURNS III, Charlean Sanfilippo in Treatment: 75 Education Assessment Education Provided To: Patient Education Topics Provided Wound/Skin Impairment: Handouts: Other: need for compression ongoing Methods: Demonstration, Explain/Verbal Responses: State content correctly Electronic Signature(s) Signed: 01/01/2015 5:13:09 PM By: Montey Hora Entered By: Montey Hora on 12/31/2014 08:51:47 [name] ([id number]) -------------------------------------------------------------------------------- Vitals Details Patient Name: [name] Date of Service: 12/31/2014 8:00 AM Medical Record Number: [id number] Patient Account Number: [id number] Date of Birth/Sex: 01-14-1956 (59 y.o. Female) Treating RN: Montey Hora Primary Care Physician: [name] Other Clinician: Referring Physician: [name] Treating Physician/Extender: BURNS III, WALTER Weeks in Treatment: 92 Vital Signs Time Taken:  08:21 Temperature (F): 98.3 Pulse (bpm): 51 Respiratory Rate (breaths/min): 18 Blood Pressure (mmHg): 136/49 Reference Range: 80 - 120 mg / dl Electronic Signature(s) Signed: 01/01/2015 5:13:09 PM By: Montey Hora Entered By: Montey Hora on 12/31/2014 08:22:54

## 2015-01-02 NOTE — Progress Notes (Signed)
[name] ([id number]) Visit Report for 12/31/2014 Chief Complaint Document Details Patient Name: [name]. Date of Service: 12/31/2014 8:00 AM Medical Record Number: [id number] Patient Account Number: [id number] Date of Birth/Sex: Sep 09, 1955 (59 y.o. Female) Treating RN: Primary Care Physician: [name] Other Clinician: Referring Physician: [name] Treating Physician/Extender: BURNS III, Charlean Sanfilippo in Treatment: 92 Information Obtained from: Patient Chief Complaint Bilateral lower extremity phlebolymphedema and ulcerations since May 2014 (healed). Electronic Signature(s) Signed: 12/31/2014 1:56:48 PM By: Loletha Grayer MD Entered By: Loletha Grayer on 12/31/2014 09:13:54 [name] ([id number]) -------------------------------------------------------------------------------- HPI Details Patient Name: [name] Date of Service: 12/31/2014 8:00 AM Medical Record Number: [id number] Patient Account Number: [id number] Date of Birth/Sex: 10/01/1955 (59 y.o. Female) Treating RN: Primary Care Physician: [name] Other Clinician: Referring Physician: [name] Treating Physician/Extender: BURNS III, Anyelina Claycomb Weeks in Treatment: 70 History of Present Illness HPI Description: 59yo w/ h/o BLE phlebolymphedema, type 2 DM, and obesity. No PVD. h/o chronic, recurrent BLE calf ulcers. Tolerating 4 layer compression. Ulcers previously healed, but recurred with juxtalite. Infrequently uses lymphedema pump. Bilateral lower extremity ulcerations again healed as of June 2016. Fitted for custom compression stockings but not yet received. She returns to clinic today and is without complaints. No significant pain. No drainage. No fever or chills. Electronic Signature(s) Signed: 12/31/2014 1:56:48 PM By: Loletha Grayer MD Entered By: Loletha Grayer on 12/31/2014 09:15:38 [name]  ([id number]) -------------------------------------------------------------------------------- Physical Exam Details Patient Name: [name] Date of Service: 12/31/2014 8:00 AM Medical Record Number: [id number] Patient Account Number: [id number] Date of Birth/Sex: 08/26/1955 (59 y.o. Female) Treating RN: Primary Care Physician: [name] Other Clinician: Referring Physician: [name] Treating Physician/Extender: BURNS III, Kavonte Bearse Weeks in Treatment: 92 Constitutional . Pulse regular. Respirations normal and unlabored. Afebrile. . Notes Bilateral lower extremity ulcerations are completely re-epithelialized. No drainage. No evidence for infection. 1+ pitting edema. Palpable DP bilaterally. Left great toe ulcer remains healed. Electronic Signature(s) Signed: 12/31/2014 1:56:48 PM By: Loletha Grayer MD Entered By: Loletha Grayer on 12/31/2014 09:16:17 [name] ([id number]) -------------------------------------------------------------------------------- Physician Orders Details Patient Name: [name] Date of Service: 12/31/2014 8:00 AM Medical Record Number: [id number] Patient Account Number: [id number] Date of Birth/Sex: Aug 17, 1955 (59 y.o. Female) Treating RN: Montey Hora Primary Care Physician: [name] Other Clinician: Referring Physician: [name] Treating Physician/Extender: BURNS III, Charlean Sanfilippo in Treatment: 30 Verbal / Phone Orders: Yes Clinician: Montey Hora Read Back and Verified: Yes Diagnosis Coding Secondary Dressing o Conform/Kerlix - to build up ankle Follow-up Appointments o Return Appointment in 1 week. Edema Control o 4 Layer Compression System - Bilateral o Elevate legs to the level of the heart and pump ankles as often as possible Electronic Signature(s) Signed: 12/31/2014 1:56:48 PM By: Loletha Grayer MD Signed: 01/01/2015 5:13:09 PM By: Montey Hora Entered By: Montey Hora on 12/31/2014  08:41:34 [name] ([id number]) -------------------------------------------------------------------------------- Problem List Details Patient Name: [name]. Date of Service: 12/31/2014 8:00 AM Medical Record Number: [id number] Patient Account Number: [id number] Date of Birth/Sex: 1956-05-06 (59 y.o. Female) Treating RN: Primary Care Physician: [name] Other Clinician: Referring Physician: [name] Treating Physician/Extender: BURNS III, Charlean Sanfilippo in Treatment: 92 Active Problems ICD-9 Encounter Code Description Active Date Diagnosis 457.1 Other Lymphedema - excludes congenital, eyelid or vulva 03/27/2013 Yes 459.33 Chronic venous hypertension with ulcer and inflammation 03/27/2013 Yes 707.12 Ulcer of lower limbs, except pressure ulcer; Ulcer of calf 03/27/2013 Yes ICD-10 Encounter Code  Description Active Date Diagnosis I89.0 Lymphedema, not elsewhere classified 03/26/2014 Yes E66.9 Obesity, unspecified 03/26/2014 Yes Inactive Problems Resolved Problems ICD-10 Code Description Active Date Resolved Date I83.222 Varicose veins of left lower extremity with both ulcer of 11/12/2014 11/12/2014 calf and inflammation L84 Corns and callosities 11/12/2014 11/12/2014 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 Date of Service: 12/31/2014 8:00 AM Medical Record Number: [id number] Patient Account Number: [id number] Date of Birth/Sex: [birth year] [age].o. Female) Treating RN: Primary Care Physician: [name] Other  Clinician: Referring Physician: [name] Treating Physician/Extender: BURNS III, Charlean Sanfilippo in Treatment: 6 Subjective Chief Complaint Information obtained from Patient Bilateral lower extremity phlebolymphedema and ulcerations since May 2014 (healed). History of Present Illness (HPI) 59yo w/ h/o BLE phlebolymphedema, type 2 DM, and obesity. No PVD. h/o chronic, recurrent BLE calf ulcers. Tolerating 4 layer compression. Ulcers previously healed, but recurred with juxtalite. Infrequently uses lymphedema pump. Bilateral lower extremity ulcerations again healed as of June 2016. Fitted for custom compression stockings but not yet received. She returns to clinic today and is without complaints. No significant pain. No drainage. No fever or chills. Objective Constitutional Pulse regular. Respirations normal and unlabored. Afebrile. Vitals Time Taken: 8:21 AM, Temperature: 98.3 F, Pulse: 51 bpm, Respiratory Rate: 18 breaths/min, Blood Pressure: 136/49 mmHg. General Notes: Bilateral lower extremity ulcerations are completely re-epithelialized. No drainage. No evidence for infection. 1+ pitting edema. Palpable DP bilaterally. Left great toe ulcer remains healed. DIVINITY, SHAHEEN ([id number]) Assessment Active Problems ICD-9 457.1 - Other Lymphedema - excludes congenital, eyelid or vulva 459.33 - Chronic venous hypertension with ulcer and inflammation 707.12 - Ulcer of lower limbs, except pressure ulcer; Ulcer of calf ICD-10 I89.0 - Lymphedema, not elsewhere classified E66.9 - Obesity, unspecified Bilateral lower extremity phlebolymphedema and healed ulcerations. Plan Secondary Dressing: Conform/Kerlix - to build up ankle Follow-up Appointments: Return Appointment in 1 week. Edema Control: 4 Layer Compression System - Bilateral Elevate legs to the level of the heart and pump ankles as often as possible Awaiting custom fit compression stockings. Continue with 4-layer  elastic compression bandages in the meantime. Lymphedema consult reordered. Podiatry consult. Electronic Signature(s) Signed: 12/31/2014 1:56:48 PM By: Loletha Grayer MD Entered By: Loletha Grayer on 12/31/2014 09:17:04 [name] ([id number]) -------------------------------------------------------------------------------- SuperBill Details Patient Name: [name] Date of Service: 12/31/2014 Medical Record Number: [id number] Patient Account Number: [id number] Date of Birth/Sex: [birth year] (59 y.o. 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 Physician Procedures CPT4 Code: [id number] Description: [id number] - WC PHYS LEVEL 2 - EST PT ICD-10 Description Diagnosis I89.0 Lymphedema, not elsewhere  classified Modifier: Quantity: 1 Electronic Signature(s) Signed: 12/31/2014 1:56:48 PM By: Loletha Grayer MD Entered By: Loletha Grayer on 12/31/2014 09:17:30

## 2015-01-07 ENCOUNTER — Encounter: Payer: Managed Care, Other (non HMO) | Admitting: Surgery

## 2015-01-07 NOTE — Progress Notes (Addendum)
RYLAND, GROM ([id number]) Visit Report for 01/07/2015 Arrival Information Details Patient Name: [name], [name]. Date of Service: 01/07/2015 3:45 PM Medical Record Number: [id number] Patient Account Number: [id number] Date of Birth/Sex: September 04, 1955 (59 y.o. Female) Treating RN: Afful, RN, BSN, Velva Harman Primary Care Physician: [name] Other Clinician: Referring Physician: [name] Treating Physician/Extender: BURNS III, Charlean Sanfilippo in Treatment: 57 Visit Information History Since Last Visit Any new allergies or adverse reactions: No Patient Arrived: Wheel Chair Had a fall or experienced change in No Arrival Time: 15:30 activities of daily living that may affect Accompanied By: SELF risk of falls: Transfer Assistance: None Hospitalized since last visit: No Patient Identification Verified: Yes Has Dressing in Place as Prescribed: Yes Secondary Verification Process Yes Has Compression in Place as Prescribed: Yes Completed: Pain Present Now: No Patient Requires Transmission- No Based Precautions: Patient Has Alerts: Yes Patient Alerts: Patient on Blood Thinner Electronic Signature(s) Signed: 01/07/2015 3:58:24 PM By: Regan Lemming BSN, RN Entered By: Regan Lemming on 01/07/2015 15:58:24 [name] ([id number]) -------------------------------------------------------------------------------- General Visit Notes Details Patient Name: [name]. Date of Service: 01/07/2015 3:45 PM Medical Record Number: [id number] Patient Account Number: [id number] Date of Birth/Sex: 1955-07-29 (59 y.o. Female) Treating RN: Baruch Gouty, RN, BSN, Velva Harman Primary Care Physician: [name] Other Clinician: Referring Physician: [name] Treating Physician/Extender: BURNS III, Charlean Sanfilippo in Treatment: 75 Notes Bilaterally wrapped patient lower legs to control, edema until seen by lymph clinic and received custom fit compression stockings. Electronic Signature(s) Signed:  01/07/2015 4:01:12 PM By: Regan Lemming BSN, RN Entered By: Regan Lemming on 01/07/2015 16:01:12 [name] ([id number]) -------------------------------------------------------------------------------- Lower Extremity Assessment Details Patient Name: [name]. Date of Service: 01/07/2015 3:45 PM Medical Record Number: [id number] Patient Account Number: [id number] Date of Birth/Sex: Jan 17, 1956 (59 y.o. Female) Treating RN: Afful, RN, BSN, Velva Harman Primary Care Physician: [name] Other Clinician: Referring Physician: [name] Treating Physician/Extender: BURNS III, Charlean Sanfilippo in Treatment: 93 Edema Assessment Assessed: [Left: No] [Right: No] E[Left: dema] [Right: :] Calf Left: Right: Point of Measurement: 24 cm From Medial Instep 43.2 cm 38.5 cm Ankle Left: Right: Point of Measurement: 8 cm From Medial Instep 23.3 cm 22.2 cm Vascular Assessment Claudication: Claudication Assessment [Left:None] [Right:None] Pulses: Posterior Tibial Dorsalis Pedis Palpable: [Left:Yes] [Right:Yes] Extremity colors, hair growth, and conditions: Extremity Color: [Left:Mottled] [Right:Mottled] Hair Growth on Extremity: [Left:No] [Right:No] Temperature of Extremity: [Left:Warm] [Right:Warm] Capillary Refill: [Left:< 3 seconds] [Right:< 3 seconds] Dependent Rubor: [Left:No] [Right:No] Blanched when Elevated: [Right:No] Lipodermatosclerosis: [Left:No] [Right:No] Toe Nail Assessment Left: Right: Thick: Yes Yes Discolored: Yes Yes Deformed: Yes Yes Improper Length and Hygiene: Yes Yes [name], [name] ([id number]) Electronic Signature(s) Signed: 01/07/2015 3:59:50 PM By: Regan Lemming BSN, RN Entered By: Regan Lemming on 01/07/2015 15:59:50 [name] ([id number]) -------------------------------------------------------------------------------- Multi Wound Chart Details Patient Name: [name]. Date of Service: 01/07/2015 3:45 PM Medical Record Number:  [id number] Patient Account Number: [id number] Date of Birth/Sex: Jan 13, 1956 (59 y.o. Female) Treating RN: Baruch Gouty, RN, BSN, Velva Harman Primary Care Physician: [name] Other Clinician: Referring Physician: [name] Treating Physician/Extender: BURNS III, Charlean Sanfilippo in Treatment: 93 Wound Assessments Treatment Notes Electronic Signature(s) Signed: 01/07/2015 3:57:18 PM By: Regan Lemming BSN, RN Entered By: Regan Lemming on 01/07/2015 15:57:17 [name] ([id number]) -------------------------------------------------------------------------------- Multi-Disciplinary Care Plan Details Patient Name: [name] Date of Service: 01/07/2015 3:45 PM Medical Record Number: [id number] Patient Account Number: [id number] Date of Birth/Sex: 10-23-1955 (59 y.o. Female) Treating RN: Afful, RN, BSN, [facility] Primary Care Physician:  [name] Other Clinician: Referring Physician: [name] Treating Physician/Extender: BURNS III, Charlean Sanfilippo in Treatment: 73 Active Inactive Nutrition Nursing Diagnoses: Impaired glucose control: actual or potential Goals: Patient/caregiver verbalizes understanding of need to maintain therapeutic glucose control per primary care physician Date Initiated: 12/16/2013 Goal Status: Active Interventions: Provide education on elevated blood sugars and impact on wound healing Notes: Soft Tissue Infection Nursing Diagnoses: Impaired tissue integrity Goals: Patient will remain free of wound infection Date Initiated: 12/16/2013 Goal Status: Active Interventions: Assess signs and symptoms of infection every visit Notes: Venous Leg Ulcer Nursing Diagnoses: Knowledge deficit related to disease process and management Potential for venous Insuffiency (use before diagnosis confirmed) GoalsYORDANOS, [name] ([id number]) Patient will maintain optimal edema control Date Initiated: 08/18/2014 Goal Status: Active Interventions: Assess peripheral edema  status every visit. Compression as ordered Treatment Activities: Therapeutic compression applied : 01/07/2015 Notes: Electronic Signature(s) Signed: 01/07/2015 3:56:56 PM By: Regan Lemming BSN, RN Entered By: Regan Lemming on 01/07/2015 15:56:55 [name] ([id number]) -------------------------------------------------------------------------------- Pain Assessment Details Patient Name: [name]. Date of Service: 01/07/2015 3:45 PM Medical Record Number: [id number] Patient Account Number: [id number] Date of Birth/Sex: 20-Dec-1955 (59 y.o. Female) Treating RN: Baruch Gouty, RN, BSN, Velva Harman Primary Care Physician: [name] Other Clinician: Referring Physician: [name] Treating Physician/Extender: BURNS III, Charlean Sanfilippo in Treatment: 55 Active Problems Location of Pain Severity and Description of Pain Patient Has Paino No Site Locations Pain Management and Medication Current Pain Management: Electronic Signature(s) Signed: 01/07/2015 3:58:31 PM By: Regan Lemming BSN, RN Entered By: Regan Lemming on 01/07/2015 15:58:31 [name] ([id number]) -------------------------------------------------------------------------------- Vitals Details Patient Name: [name]. Date of Service: 01/07/2015 3:45 PM Medical Record Number: [id number] Patient Account Number: [id number] Date of Birth/Sex: [birth year] (59 y.o. Female) Treating RN: Afful, RN, BSN, Velva Harman Primary Care Physician: [name] Other Clinician: Referring Physician: [name] Treating Physician/Extender: BURNS III, WALTER Weeks in Treatment: 93 Vital Signs Time Taken: 15:38 Temperature (F): 98.2 Pulse (bpm): 49 Respiratory Rate (breaths/min): 18 Blood Pressure (mmHg): 132/48 Reference Range: 80 - 120 mg / dl Electronic Signature(s) Signed: 01/07/2015 3:58:55 PM By: Regan Lemming BSN, RN Entered By: Regan Lemming on 01/07/2015 15:58:55

## 2015-01-09 NOTE — Progress Notes (Signed)
[name], [name] ([id number]) Visit Report for 01/07/2015 Chief Complaint Document Details Patient Name: [name], [name]. Date of Service: 01/07/2015 3:45 PM Medical Record Number: [id number] Patient Account Number: [id number] Date of Birth/Sex: 02-19-1956 (59 y.o. Female) Treating RN: Baruch Gouty, RN, BSN, Velva Harman Primary Care Physician: [name] Other Clinician: Referring Physician: [name] Treating Physician/Extender: BURNS III, Charlean Sanfilippo in Treatment: 49 Information Obtained from: Patient Chief Complaint Bilateral lower extremity phlebolymphedema and ulcerations since May 2014 (healed). Electronic Signature(s) Signed: 01/09/2015 10:32:07 AM By: Loletha Grayer MD Entered By: Loletha Grayer on 01/09/2015 10:29:14 [name] ([id number]) -------------------------------------------------------------------------------- HPI Details Patient Name: [name] Date of Service: 01/07/2015 3:45 PM Medical Record Number: [id number] Patient Account Number: [id number] Date of Birth/Sex: [birth year] (59 y.o. Female) Treating RN: Baruch Gouty, RN, BSN, Velva Harman Primary Care Physician: [name] Other Clinician: Referring Physician: [name] Treating Physician/Extender: BURNS III, WALTER Weeks in Treatment: 63 History of Present Illness HPI Description: 59yo w/ h/o BLE phlebolymphedema, type 2 DM, and obesity. No PVD. h/o chronic, recurrent BLE calf ulcers. Tolerating 4 layer compression. Ulcers previously healed, but recurred with juxtalite. Infrequently uses lymphedema pump. Bilateral lower extremity ulcerations again healed as of June 2016. Fitted for custom compression stockings but not yet received. Awaiting lymphedema consult. She returns to clinic today and is without complaints. No significant pain. No drainage. No fever or chills. Electronic Signature(s) Signed: 01/09/2015 10:32:07 AM By: Loletha Grayer MD Entered By: Loletha Grayer on 01/09/2015  10:29:50 [name] ([id number]) -------------------------------------------------------------------------------- Physical Exam Details Patient Name: [name] Date of Service: 01/07/2015 3:45 PM Medical Record Number: [id number] Patient Account Number: [id number] Date of Birth/Sex: 1955/07/22 (59 y.o. Female) Treating RN: Baruch Gouty, RN, BSN, Velva Harman Primary Care Physician: [name] Other Clinician: Referring Physician: [name] Treating Physician/Extender: BURNS III, WALTER Weeks in Treatment: 93 Constitutional . Pulse regular. Respirations normal and unlabored. Afebrile. . Notes Bilateral lower extremity ulcerations are completely re-epithelialized. No drainage. No evidence for infection. 1+ pitting edema. Palpable DP bilaterally. Left great toe ulcer remains healed. Electronic Signature(s) Signed: 01/09/2015 10:32:07 AM By: Loletha Grayer MD Entered By: Loletha Grayer on 01/09/2015 10:30:19 [name] ([id number]) -------------------------------------------------------------------------------- Physician Orders Details Patient Name: [name] Date of Service: 01/07/2015 3:45 PM Medical Record Number: [id number] Patient Account Number: [id number] Date of Birth/Sex: Nov 19, 1955 (59 y.o. Female) Treating RN: Baruch Gouty, RN, BSN, Velva Harman Primary Care Physician: [name] Other Clinician: Referring Physician: [name] Treating Physician/Extender: BURNS III, Charlean Sanfilippo in Treatment: 40 Verbal / Phone Orders: Yes Clinician: Afful, RN, BSN, Rita Read Back and Verified: Yes Diagnosis Coding Secondary Dressing o Conform/Kerlix - to build up ankle Follow-up Appointments o Return Appointment in 1 week. Edema Control o 4 Layer Compression System - Bilateral o Elevate legs to the level of the heart and pump ankles as often as possible Electronic Signature(s) Signed: 01/07/2015 3:57:44 PM By: Regan Lemming BSN, RN Signed: 01/09/2015  10:32:07 AM By: Loletha Grayer MD Entered By: Regan Lemming on 01/07/2015 15:57:44 [name] ([id number]) -------------------------------------------------------------------------------- Problem List Details Patient Name: [name]. Date of Service: 01/07/2015 3:45 PM Medical Record Number: [id number] Patient Account Number: [id number] Date of Birth/Sex: 10/04/1955 (59 y.o. Female) Treating RN: Baruch Gouty, RN, BSN, Velva Harman Primary Care Physician: [name] Other Clinician: Referring Physician: [name] Treating Physician/Extender: BURNS III, Charlean Sanfilippo in Treatment: 89 Active Problems ICD-9 Encounter Code Description Active Date Diagnosis 457.1 Other Lymphedema - excludes congenital, eyelid or vulva 03/27/2013 Yes 459.33  Chronic venous hypertension with ulcer and inflammation 03/27/2013 Yes 707.12 Ulcer of lower limbs, except pressure ulcer; Ulcer of calf 03/27/2013 Yes ICD-10 Encounter Code Description Active Date Diagnosis I89.0 Lymphedema, not elsewhere classified 03/26/2014 Yes E66.9 Obesity, unspecified 03/26/2014 Yes Inactive Problems Resolved Problems ICD-10 Code Description Active Date Resolved Date 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 Date of Service: 01/07/2015 3:45 PM Medical Record Number: [id number] Patient Account Number: [id number] Date of  Birth/Sex: [birth year] (59 y.o. Female) Treating RN: Baruch Gouty, RN, BSN, Velva Harman Primary Care Physician: [name] Other Clinician: Referring Physician: [name] Treating Physician/Extender: BURNS III, Charlean Sanfilippo in Treatment: 47 Subjective Chief Complaint Information obtained from Patient Bilateral lower extremity phlebolymphedema and ulcerations since May 2014 (healed). History of Present Illness (HPI) 59yo w/ h/o BLE phlebolymphedema, type 2 DM, and obesity. No PVD. h/o chronic, recurrent BLE calf ulcers. Tolerating 4 layer compression. Ulcers previously healed, but recurred with juxtalite. Infrequently uses lymphedema pump. Bilateral lower extremity ulcerations again healed as of June 2016. Fitted for custom compression stockings but not yet received. Awaiting lymphedema consult. She returns to clinic today and is without complaints. No significant pain. No drainage. No fever or chills. Objective Constitutional Pulse regular. Respirations normal and unlabored. Afebrile. Vitals Time Taken: 3:38 PM, Temperature: 98.2 F, Pulse: 49 bpm, Respiratory Rate: 18 breaths/min, Blood Pressure: 132/48 mmHg. General Notes: Bilateral lower extremity ulcerations are completely re-epithelialized. No drainage. No evidence for infection. 1+ pitting edema. Palpable DP bilaterally. Left great toe ulcer remains healed. [name], [name] ([id number]) Assessment Active Problems ICD-9 457.1 - Other Lymphedema - excludes congenital, eyelid or vulva 459.33 - Chronic venous hypertension with ulcer and inflammation 707.12 - Ulcer of lower limbs, except pressure ulcer; Ulcer of calf ICD-10 I89.0 - Lymphedema, not elsewhere classified E66.9 - Obesity, unspecified Bilateral lower extremity phlebolymphedema and healed ulcerations. Plan Secondary Dressing: Conform/Kerlix - to build up ankle Follow-up Appointments: Return Appointment in 1 week. Edema Control: 4 Layer Compression System -  Bilateral Elevate legs to the level of the heart and pump ankles as often as possible Awaiting arrival of custom fit compression stockings. Also awaiting lymphedema consult. Continue with 4-layer compression and lymphedema pump for now. Electronic Signature(s) Signed: 01/09/2015 10:32:07 AM By: Loletha Grayer MD Entered By: Loletha Grayer on 01/09/2015 10:31:09 [name] ([id number]) -------------------------------------------------------------------------------- SuperBill Details Patient Name: [name] Date of Service: 01/07/2015 Medical Record Number: [id number] Patient Account Number: [id number] Date of Birth/Sex: [birth year] (59 y.o. Female) Treating RN: Afful, RN, BSN, Velva Harman Primary Care Physician: [name] Other Clinician: Referring Physician: [name] Treating Physician/Extender: BURNS III, Charlean Sanfilippo in Treatment: 18 Diagnosis Coding ICD-9 Codes Code Description 457.1 Other Lymphedema - excludes congenital, eyelid or vulva 459.33 Chronic venous hypertension with ulcer and inflammation 707.12 Ulcer of lower limbs, except pressure ulcer; Ulcer of calf ICD-10 Codes Code Description I89.0 Lymphedema, not elsewhere classified E66.9 Obesity, unspecified Physician Procedures CPT4 Code: [id number] Description: [id number] - WC PHYS LEVEL 2 - EST PT ICD-10 Description Diagnosis I89.0 Lymphedema, not elsewhere classified Modifier: Quantity: 1 Electronic Signature(s) Signed: 01/09/2015  10:32:07 AM By: Loletha Grayer MD Entered By: Loletha Grayer on 01/09/2015 10:31:33

## 2015-01-14 ENCOUNTER — Encounter: Payer: Managed Care, Other (non HMO) | Admitting: Surgery

## 2015-01-14 DIAGNOSIS — I890 Lymphedema, not elsewhere classified: Secondary | ICD-10-CM | POA: Diagnosis not present

## 2015-01-14 NOTE — Progress Notes (Signed)
BRAYLIE, HENNING ([id number]) Visit Report for 01/14/2015 Chief Complaint Document Details Patient Name: [name], [name]. Date of Service: 01/14/2015 8:00 AM Medical Record Number: [id number] Patient Account Number: [id number] Date of Birth/Sex: 10-15-1955 (59 y.o. Female) Treating RN: Baruch Gouty, RN, BSN, Velva Harman Primary Care Physician: [name] Other Clinician: Referring Physician: [name] Treating Physician/Extender: BURNS III, Charlean Sanfilippo in Treatment: 47 Information Obtained from: Patient Chief Complaint Bilateral lower extremity phlebolymphedema and ulcerations since May 2014 (healed). Electronic Signature(s) Signed: 01/14/2015 4:18:55 PM By: Loletha Grayer MD Entered By: Loletha Grayer on 01/14/2015 11:11:09 [name] ([id number]) -------------------------------------------------------------------------------- HPI Details Patient Name: [name] Date of Service: 01/14/2015 8:00 AM Medical Record Number: [id number] Patient Account Number: [id number] Date of Birth/Sex: Aug 17, 1955 (59 y.o. Female) Treating RN: Baruch Gouty, RN, BSN, Velva Harman Primary Care Physician: [name] Other Clinician: Referring Physician: [name] Treating Physician/Extender: BURNS III, WALTER Weeks in Treatment: 19 History of Present Illness HPI Description: 59yo w/ h/o BLE phlebolymphedema, type 2 DM, and obesity. No PVD. h/o chronic, recurrent BLE calf ulcers. Tolerating 4 layer compression. Ulcers previously healed, but recurred with juxtalite. Infrequently uses lymphedema pump. Bilateral lower extremity ulcerations again healed as of June 2016. Fitted for custom compression stockings but not yet received. Awaiting lymphedema consult. She returns to clinic today and is without complaints. No significant pain. No drainage. No fever or chills. Electronic Signature(s) Signed: 01/14/2015 4:18:55 PM By: Loletha Grayer MD Entered By: Loletha Grayer on 01/14/2015  11:11:25 [name] ([id number]) -------------------------------------------------------------------------------- Physical Exam Details Patient Name: [name] Date of Service: 01/14/2015 8:00 AM Medical Record Number: [id number] Patient Account Number: [id number] Date of Birth/Sex: [birth year] (59 y.o. Female) Treating RN: Baruch Gouty, RN, BSN, Velva Harman Primary Care Physician: [name] Other Clinician: Referring Physician: [name] Treating Physician/Extender: BURNS III, WALTER Weeks in Treatment: 94 Constitutional . Pulse regular. Respirations normal and unlabored. Afebrile. Marland Kitchen Respiratory WNL. No retractions.. Breath sounds WNL, No rubs, rales, rhonchi, or wheeze.. Cardiovascular Pedal Pulses WNL. Integumentary (Hair, Skin) .Marland Kitchen Neurological Sensation normal to touch, pin,and vibration. Psychiatric Judgement and insight Intact.. Oriented times 3.. No evidence of depression, anxiety, or agitation.. Notes Bilateral lower extremity ulcerations are completely re-epithelialized. No drainage. No evidence for infection. 1+ pitting edema. Palpable DP bilaterally. Left great toe ulcer remains healed. Electronic Signature(s) Signed: 01/14/2015 4:18:55 PM By: Loletha Grayer MD Entered By: Loletha Grayer on 01/14/2015 11:12:19 [name] ([id number]) -------------------------------------------------------------------------------- Physician Orders Details Patient Name: [name] Date of Service: 01/14/2015 8:00 AM Medical Record Number: [id number] Patient Account Number: [id number] Date of Birth/Sex: Nov 30, 1955 (59 y.o. Female) Treating RN: Baruch Gouty, RN, BSN, Velva Harman Primary Care Physician: [name] Other Clinician: Referring Physician: [name] Treating Physician/Extender: BURNS III, Charlean Sanfilippo in Treatment: 1 Verbal / Phone Orders: Yes Clinician: Afful, RN, BSN, Rita Read Back and Verified: Yes Diagnosis Coding Secondary Dressing o  Conform/Kerlix - to build up ankle Follow-up Appointments o Return Appointment in 1 week. Edema Control o 4 Layer Compression System - Bilateral o Elevate legs to the level of the heart and pump ankles as often as possible Electronic Signature(s) Signed: 01/14/2015 3:55:21 PM By: Regan Lemming BSN, RN Signed: 01/14/2015 4:18:55 PM By: Loletha Grayer MD Entered By: Regan Lemming on 01/14/2015 08:40:16 [name] ([id number]) -------------------------------------------------------------------------------- Problem List Details Patient Name: [name]. Date of Service: 01/14/2015 8:00 AM Medical Record Number: [id number] Patient Account Number: [id number] Date of Birth/Sex: [birth year] (59 y.o. Female) Treating RN: Afful, RN,  BSN, Velva Harman Primary Care Physician: [name] Other Clinician: Referring Physician: [name] Treating Physician/Extender: BURNS III, Charlean Sanfilippo in Treatment: 94 Active Problems ICD-9 Encounter Code Description Active Date Diagnosis 457.1 Other Lymphedema - excludes congenital, eyelid or vulva 03/27/2013 Yes 459.33 Chronic venous hypertension with ulcer and inflammation 03/27/2013 Yes 707.12 Ulcer of lower limbs, except pressure ulcer; Ulcer of calf 03/27/2013 Yes ICD-10 Encounter Code Description Active Date Diagnosis I89.0 Lymphedema, not elsewhere classified 03/26/2014 Yes E66.9 Obesity, unspecified 03/26/2014 Yes E11.40 Type 2 diabetes mellitus with diabetic neuropathy, 01/14/2015 Yes unspecified R26.89 Other abnormalities of gait and mobility 01/14/2015 Yes Inactive Problems Resolved Problems ICD-10 Code Description Active Date Resolved Date 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 Date of Service: 01/14/2015 8:00 AM Medical Record Number: [id number] Patient Account Number: [id number] Date of Birth/Sex: 09-27-1955 (59 y.o. Female) Treating RN: Baruch Gouty, RN, BSN, Velva Harman Primary Care Physician: [name] Other Clinician: Referring Physician: [name] Treating Physician/Extender: BURNS III, Charlean Sanfilippo in Treatment: 36 Subjective Chief Complaint Information obtained from Patient Bilateral lower extremity phlebolymphedema and ulcerations since May 2014 (healed). History of Present Illness (HPI) 59yo w/ h/o BLE phlebolymphedema, type 2 DM, and obesity. No PVD. h/o chronic, recurrent BLE calf ulcers. Tolerating 4 layer compression. Ulcers previously healed, but recurred with juxtalite. Infrequently uses lymphedema pump. Bilateral lower extremity ulcerations again healed as of June 2016. Fitted for custom compression stockings but not yet received. Awaiting lymphedema consult. She returns to clinic today and is without complaints. No significant pain. No drainage. No fever or chills. Objective Constitutional Pulse regular. Respirations normal and unlabored. Afebrile. Vitals Time Taken: 8:25 AM, Temperature: 98.6 F, Pulse: 53 bpm, Respiratory Rate: 19 breaths/min, Blood Pressure: 207/73 mmHg. Respiratory WNL. No retractions.. Breath sounds WNL, No rubs, rales, rhonchi, or wheeze.. Cardiovascular Pedal Pulses WNL. Neurological Sensation normal to touch, pin,and vibration. AYVA, ROWINSKI ([id number]) Psychiatric Judgement and insight Intact.. Oriented times 3.. No evidence of depression, anxiety, or  agitation.. General Notes: Bilateral lower extremity ulcerations are completely re-epithelialized. No drainage. No evidence for infection. 1+ pitting edema. Palpable DP bilaterally. Left great toe ulcer remains healed. Integumentary (Hair, Skin) Assessment Active Problems ICD-9 457.1 - Other Lymphedema - excludes congenital, eyelid or vulva 459.33 - Chronic venous hypertension with ulcer and inflammation 707.12 - Ulcer of lower limbs, except pressure ulcer; Ulcer of calf ICD-10 I89.0 - Lymphedema, not elsewhere classified E66.9 - Obesity, unspecified E11.40 - Type 2 diabetes mellitus with diabetic neuropathy, unspecified R26.89 - Other abnormalities of gait and mobility Bilateral lower extremity phlebolymphedema. Healed ulcerations. Plan Secondary Dressing: Conform/Kerlix - to build up ankle Follow-up Appointments: Return Appointment in 1 week. Edema Control: 4 Layer Compression System - Bilateral Elevate legs to the level of the heart and pump ankles as often as possible Lampi, Wajiha K. ([id number]) 4-layer compression. I asked Ms. Gangwer to bring her juxtalite next week so that we can potentially transition her back to juxtalite compression. Still awaiting custom fit compression stockings. Still awaiting lymphedema consult visit. She will follow-up with Dr. Rudene Christians and Dr. Ronnald Ramp regarding her back  pain, unsteady gait, and potential need for a walker or wheelchair. Electronic Signature(s) Signed: 01/14/2015 4:18:55 PM By: Loletha Grayer MD Entered By: Loletha Grayer on 01/14/2015 11:17:01 [name] ([id number]) -------------------------------------------------------------------------------- SuperBill Details Patient Name: [name] Date of Service: 01/14/2015 Medical Record Number: [id number] Patient Account Number: [id number] Date of Birth/Sex: July 20, 1955 (59 y.o. Female) Treating RN: Afful, RN, BSN, Velva Harman Primary Care Physician: [name] Other  Clinician: Referring Physician: [name] Treating Physician/Extender: BURNS III, Charlean Sanfilippo in Treatment: 10/04/1992 Diagnosis Coding ICD-9 Codes Code Description 457.1 Other Lymphedema - excludes congenital, eyelid or vulva 459.33 Chronic venous hypertension with ulcer and inflammation 707.12 Ulcer of lower limbs, except pressure ulcer; Ulcer of calf ICD-10 Codes Code Description I89.0 Lymphedema, not elsewhere classified E66.9 Obesity, unspecified Facility Procedures CPT4: Description Modifier Quantity Code [id number] BILATERAL: Application of multi-layer venous compression 1 system; leg (below knee), including ankle and foot. Physician Procedures CPT4 Code: [id number] Description: [id number] - WC PHYS LEVEL 3 - EST PT ICD-10 Description Diagnosis I89.0 Lymphedema, not elsewhere classified Modifier: Quantity: 1 Electronic Signature(s) Signed: 01/14/2015 4:18:55 PM By: Loletha Grayer MD Entered By: Loletha Grayer on 01/14/2015 11:15:52

## 2015-01-14 NOTE — Progress Notes (Signed)
[name] ([id number]) Visit Report for 01/14/2015 Arrival Information Details Patient Name: [name], [name]. Date of Service: 01/14/2015 8:00 AM Medical Record Number: [id number] Patient Account Number: [id number] Date of Birth/Sex: May 28, 1956 (59 y.o. Female) Treating RN: Afful, RN, BSN, Velva Harman Primary Care Physician: [name] Other Clinician: Referring Physician: [name] Treating Physician/Extender: BURNS III, Charlean Sanfilippo in Treatment: 50 Visit Information History Since Last Visit Any new allergies or adverse reactions: No Patient Arrived: [name] Had a fall or experienced change in No Arrival Time: 08:24 activities of daily living that may affect Accompanied By: self risk of falls: Transfer Assistance: None Signs or symptoms of abuse/neglect since last No Patient Identification Verified: Yes visito Secondary Verification Process Yes Hospitalized since last visit: No Completed: Has Dressing in Place as Prescribed: Yes Patient Requires Transmission- No Has Compression in Place as Prescribed: Yes Based Precautions: Pain Present Now: No Patient Has Alerts: Yes Patient Alerts: Patient on Blood Thinner Electronic Signature(s) Signed: 01/14/2015 3:55:21 PM By: Regan Lemming BSN, RN Entered By: Regan Lemming on 01/14/2015 08:25:37 [name] ([id number]) -------------------------------------------------------------------------------- Lower Extremity Assessment Details Patient Name: [name]. Date of Service: 01/14/2015 8:00 AM Medical Record Number: [id number] Patient Account Number: [id number] Date of Birth/Sex: [birth year] (59 y.o. Female) Treating RN: Afful, RN, BSN, Velva Harman Primary Care Physician: [name] Other Clinician: Referring Physician: [name] Treating Physician/Extender: BURNS III, Charlean Sanfilippo in Treatment: 94 Edema Assessment Assessed: [Left: No] [Right: No] E[Left: dema] [Right: :] Calf Left: Right: Point of  Measurement: 24 cm From Medial Instep 42.5 cm 39 cm Ankle Left: Right: Point of Measurement: 8 cm From Medial Instep 25 cm 23 cm Vascular Assessment Claudication: Claudication Assessment [Left:None] [Right:None] Pulses: Posterior Tibial Dorsalis Pedis Palpable: [Left:Yes] [Right:Yes] Extremity colors, hair growth, and conditions: Extremity Color: [Left:Mottled] [Right:Mottled] Hair Growth on Extremity: [Left:No] [Right:No] Temperature of Extremity: [Left:Warm] [Right:Warm] Capillary Refill: [Left:< 3 seconds] [Right:< 3 seconds] Toe Nail Assessment Left: Right: Thick: Yes Yes Discolored: Yes Yes Deformed: Yes Yes Improper Length and Hygiene: Yes Yes Electronic Signature(s) Signed: 01/14/2015 3:55:21 PM By: Regan Lemming BSN, RN Entered By: Regan Lemming on 01/14/2015 08:31:59 [name] ([id number]) [name], [name] ([id number]) -------------------------------------------------------------------------------- Multi Wound Chart Details Patient Name: [name]. Date of Service: 01/14/2015 8:00 AM Medical Record Number: [id number] Patient Account Number: [id number] Date of Birth/Sex: [birth year] (59 y.o. Female) Treating RN: Baruch Gouty, RN, BSN, Velva Harman Primary Care Physician: [name] Other Clinician: Referring Physician: [name] Treating Physician/Extender: BURNS III, Charlean Sanfilippo in Treatment: 94 Vital Signs Height(in): Pulse(bpm): 53 Weight(lbs): Blood Pressure 207/73 (mmHg): Body Mass Index(BMI): Temperature(F): 98.6 Respiratory Rate 19 (breaths/min): Wound Assessments Treatment Notes Electronic Signature(s) Signed: 01/14/2015 3:55:21 PM By: Regan Lemming BSN, RN Entered By: Regan Lemming on 01/14/2015 08:39:41 [name] ([id number]) -------------------------------------------------------------------------------- Multi-Disciplinary Care Plan Details Patient Name: [name] Date of Service: 01/14/2015 8:00 AM Medical Record  Number: [id number] Patient Account Number: [id number] Date of Birth/Sex: 05/20/1956 (59 y.o. Female) Treating RN: Afful, RN, BSN, Velva Harman Primary Care Physician: [name] Other Clinician: Referring Physician: [name] Treating Physician/Extender: BURNS III, Charlean Sanfilippo in Treatment: 54 Active Inactive Nutrition Nursing Diagnoses: Impaired glucose control: actual or potential Goals: Patient/caregiver verbalizes understanding of need to maintain therapeutic glucose control per primary care physician Date Initiated: 12/16/2013 Goal Status: Active Interventions: Provide education on elevated blood sugars and impact on wound healing Notes: Soft Tissue Infection Nursing Diagnoses: Impaired tissue integrity Goals: Patient will remain free of wound infection Date Initiated: 12/16/2013 Goal Status:  Active Interventions: Assess signs and symptoms of infection every visit Notes: Venous Leg Ulcer Nursing Diagnoses: Knowledge deficit related to disease process and management Potential for venous Insuffiency (use before diagnosis confirmed) GoalsAPOLLINE, [name] ([id number]) Patient will maintain optimal edema control Date Initiated: 08/18/2014 Goal Status: Active Interventions: Assess peripheral edema status every visit. Compression as ordered Treatment Activities: Therapeutic compression applied : 01/14/2015 Notes: Electronic Signature(s) Signed: 01/14/2015 3:55:21 PM By: Regan Lemming BSN, RN Entered By: Regan Lemming on 01/14/2015 08:39:31 [name] ([id number]) -------------------------------------------------------------------------------- Pain Assessment Details Patient Name: [name]. Date of Service: 01/14/2015 8:00 AM Medical Record Number: [id number] Patient Account Number: [id number] Date of Birth/Sex: [birth year] (59 y.o. Female) Treating RN: Baruch Gouty, RN, BSN, Velva Harman Primary Care Physician: [name] Other Clinician: Referring Physician: [name] Treating Physician/Extender: BURNS III, Charlean Sanfilippo in Treatment: 18 Active Problems Location of Pain Severity and Description of Pain Patient Has Paino No Site Locations Pain Management and Medication Current Pain Management: Electronic Signature(s) Signed: 01/14/2015 3:55:21 PM By: Regan Lemming BSN, RN Entered By: Regan Lemming on 01/14/2015 08:25:57 [name] ([id number]) -------------------------------------------------------------------------------- Vitals Details Patient Name: [name]. Date of Service: 01/14/2015 8:00 AM Medical Record Number: [id number] Patient Account Number: [id number] Date of Birth/Sex: Aug 25, 1955 (59 y.o. Female) Treating RN: Afful, RN, BSN, Velva Harman Primary Care Physician: [name] Other Clinician: Referring Physician: [name] Treating Physician/Extender: BURNS III, WALTER Weeks in Treatment: 94 Vital Signs Time Taken: 08:25 Temperature (F): 98.6 Pulse (bpm): 53 Respiratory Rate (breaths/min): 19 Blood Pressure (mmHg): 207/73 Reference Range: 80 - 120 mg / dl Electronic Signature(s) Signed: 01/14/2015 3:55:21 PM By: Regan Lemming BSN, RN Entered By: Regan Lemming on 01/14/2015 08:26:20

## 2015-01-17 ENCOUNTER — Other Ambulatory Visit: Payer: Self-pay | Admitting: Family Medicine

## 2015-01-17 DIAGNOSIS — E119 Type 2 diabetes mellitus without complications: Secondary | ICD-10-CM

## 2015-01-17 DIAGNOSIS — M2550 Pain in unspecified joint: Secondary | ICD-10-CM

## 2015-01-17 DIAGNOSIS — E039 Hypothyroidism, unspecified: Secondary | ICD-10-CM

## 2015-01-17 DIAGNOSIS — I10 Essential (primary) hypertension: Secondary | ICD-10-CM

## 2015-01-17 DIAGNOSIS — R609 Edema, unspecified: Secondary | ICD-10-CM

## 2015-01-21 ENCOUNTER — Encounter: Payer: Self-pay | Admitting: General Surgery

## 2015-01-21 ENCOUNTER — Encounter (HOSPITAL_BASED_OUTPATIENT_CLINIC_OR_DEPARTMENT_OTHER): Payer: Managed Care, Other (non HMO) | Admitting: General Surgery

## 2015-01-21 DIAGNOSIS — I890 Lymphedema, not elsewhere classified: Secondary | ICD-10-CM | POA: Diagnosis not present

## 2015-01-21 DIAGNOSIS — L97929 Non-pressure chronic ulcer of unspecified part of left lower leg with unspecified severity: Principal | ICD-10-CM

## 2015-01-21 DIAGNOSIS — I83019 Varicose veins of right lower extremity with ulcer of unspecified site: Secondary | ICD-10-CM

## 2015-01-21 DIAGNOSIS — L97919 Non-pressure chronic ulcer of unspecified part of right lower leg with unspecified severity: Principal | ICD-10-CM

## 2015-01-21 DIAGNOSIS — I872 Venous insufficiency (chronic) (peripheral): Secondary | ICD-10-CM

## 2015-01-21 DIAGNOSIS — I83029 Varicose veins of left lower extremity with ulcer of unspecified site: Principal | ICD-10-CM

## 2015-01-21 NOTE — Progress Notes (Signed)
See i heal 

## 2015-01-28 ENCOUNTER — Ambulatory Visit: Payer: Managed Care, Other (non HMO) | Admitting: Surgery

## 2015-01-29 NOTE — Progress Notes (Addendum)
[name], [name] ([id number]) Visit Report for 01/21/2015 Chief Complaint Document Details Patient Name: [name], [name]. Date of Service: 01/21/2015 8:00 AM Medical Record Number: [id number] Patient Account Number: [id number] Date of Birth/Sex: [birth year] (59 y.o. 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 Electronic Signature(s) Signed: 01/28/2015 4:58:55 PM By: Gretta Cool RN, BSN, Kim RN, BSN Previous Signature: 01/21/2015 12:43:00 PM Version By: Judene Companion MD Entered By: Gretta Cool RN, BSN, Kim on 01/28/2015 13:55:48 [name] ([id number]) -------------------------------------------------------------------------------- HPI Details Patient Name: [name] Date of Service: 01/21/2015 8:00 AM Medical Record Number: [id number] Patient Account Number: [id number] Date of Birth/Sex: [birth year] (59 y.o. Female) Treating RN: Primary Care Physician: [name] Other Clinician: Referring Physician: [name] Treating Physician/Extender: Judene Companion Weeks in Treatment: 95 History of Present Illness HPI Description: 59yo w/ h/o BLE phlebolymphedema, type 2 DM, and obesity. No PVD. h/o chronic, recurrent BLE calf ulcers. Tolerating 4 layer compression. Ulcers previously healed, but recurred with juxtalite. Infrequently uses lymphedema pump. Bilateral lower extremity ulcerations again healed as of June 2016. Fitted for custom compression stockings but not yet received. Awaiting lymphedema consult. She returns to clinic today and is without complaints. No significant pain. No drainage. No fever or chills. Electronic Signature(s) Signed: 01/28/2015 4:58:55 PM By: Gretta Cool RN, BSN, Kim RN, BSN Previous  Signature: 01/21/2015 12:43:00 PM Version By: Judene Companion MD Entered By: Gretta Cool RN, BSN, Kim on 01/28/2015 13:55:58 [name] ([id number]) -------------------------------------------------------------------------------- Physical Exam Details Patient Name: [name] Date of Service: 01/21/2015 8:00 AM Medical Record Number: [id number] Patient Account Number: [id number] Date of Birth/Sex: June 22, 1956 (59 y.o. Female) Treating RN: Primary Care Physician: [name] Other Clinician: Referring Physician: [name] Treating Physician/Extender: Benjaman Pott in Treatment: 95 Electronic Signature(s) Signed: 01/28/2015 4:58:55 PM By: Gretta Cool RN, BSN, Kim RN, BSN Previous Signature: 01/21/2015 12:43:00 PM Version By: Judene Companion MD Entered By: Gretta Cool RN, BSN, Kim on 01/28/2015 13:56:59 [name] ([id number]) -------------------------------------------------------------------------------- Physician Orders Details Patient Name: [name] Date of Service: 01/21/2015 8:00 AM Medical Record Number: [id number] Patient Account Number: [id number] Date of Birth/Sex: [birth year] (59 y.o. Female) Treating RN: Montey Hora Primary Care Physician: [name] Other Clinician: Referring Physician: [name] Treating Physician/Extender: Benjaman Pott in Treatment: 95 Verbal / Phone Orders: Yes Clinician: Montey Hora Read Back and Verified: Yes Diagnosis Coding Wound Cleansing Wound #52 Right,Proximal,Anterior Lower Leg o Cleanse wound with mild soap and water Anesthetic Wound #52 Right,Proximal,Anterior Lower Leg o Topical Lidocaine 4% cream applied to wound bed prior to debridement Primary Wound Dressing Wound #52 Right,Proximal,Anterior Lower Leg o Iodoflex Secondary Dressing o Conform/Kerlix - to build up ankle o Non-adherent pad Dressing Change Frequency Wound #52 Right,Proximal,Anterior Lower Leg o Change dressing  every week Follow-up Appointments Wound #52 Right,Proximal,Anterior Lower Leg o Return Appointment in 1 week. Edema Control Wound #52 Right,Proximal,Anterior Lower Leg o 4 Layer Compression System - Bilateral Electronic Signature(s) Signed: 01/21/2015 4:34:46 PM By: Regan Lemming BSN, RN Entered By: Regan Lemming on 01/21/2015 10:21:10 [name] ([id number]) -------------------------------------------------------------------------------- Problem List Details Patient Name: [name]. Date of Service: 01/21/2015 8:00 AM Medical Record Number: [id number] Patient Account Number: [id number] Date of Birth/Sex: 01/04/1956 (59 y.o. Female) Treating RN: Montey Hora Primary Care Physician: [name] Other Clinician: Referring Physician: [name] Treating Physician/Extender: Benjaman Pott  in Treatment: 95 Active Problems ICD-9 Encounter Code Description Active Date Diagnosis 457.1 Other Lymphedema - excludes congenital, eyelid or vulva 03/27/2013 Yes 459.33 Chronic venous hypertension with ulcer and inflammation 03/27/2013 Yes 707.12 Ulcer of lower limbs, except pressure ulcer; Ulcer of calf 03/27/2013 Yes ICD-10 Encounter Code Description Active Date Diagnosis I89.0 Lymphedema, not elsewhere classified 03/26/2014 Yes E66.9 Obesity, unspecified 03/26/2014 Yes E11.40 Type 2 diabetes mellitus with diabetic neuropathy, 01/14/2015 Yes unspecified R26.89 Other abnormalities of gait and mobility 01/14/2015 Yes Inactive Problems Resolved Problems ICD-10 Code Description Active Date Resolved Date 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 Date of Service: 01/21/2015 8:00 AM Medical Record Number: [id number] Patient Account Number: [id number] Date of Birth/Sex: 1956-02-11 (59 y.o. Female) Treating RN: Primary Care Physician: [name] Other Clinician: Referring Physician: [name] Treating Physician/Extender: Benjaman Pott in Treatment: 95 Subjective Chief Complaint Information obtained from Patient Bilateral lower extremity phlebolymphedema and ulcerations since May 2014 (healed). History of Present Illness (HPI) 59yo w/ h/o BLE phlebolymphedema, type 2 DM, and obesity. No PVD. h/o chronic, recurrent BLE calf ulcers. Tolerating 4 layer compression. Ulcers previously healed, but recurred with juxtalite. Infrequently uses lymphedema pump. Bilateral lower extremity ulcerations again healed as of June 2016. Fitted for custom compression stockings but not yet received. Awaiting lymphedema consult. She returns to clinic today and is without complaints. No significant pain. No drainage. No fever or chills. Objective Constitutional Vitals Time Taken: 8:30 AM, Temperature: 98.3 F, Pulse: 70 bpm, Respiratory Rate: 18 breaths/min, Blood Pressure: 171/72 mmHg. Integumentary (Hair, Skin) Wound #52 status is Open. Original cause of wound was Blister. The wound is located on the Right,Proximal,Anterior Lower Leg. The wound measures 1.7cm length x 4.3cm width x 0.1cm depth; 5.741cm^2 area and 0.574cm^3 volume. The wound is limited to skin breakdown. There is no tunneling or undermining noted. There is a small amount of serous drainage noted. The wound margin is flat and intact. There is small (1-33%) red granulation within the wound bed. There is no necrotic  tissue within the wound bed. The periwound skin appearance did not exhibit: Callus, Crepitus, Excoriation, Fluctuance, Friable, Induration, Localized Edema, Rash, Scarring, Dry/Scaly, Maceration, Moist, Atrophie Blanche, Cyanosis, Ecchymosis, Hemosiderin Staining, Mottled, Pallor, Rubor, Erythema. Periwound temperature was noted as No Abnormality. [name], [name] ([id number]) Assessment Active Problems ICD-9 457.1 - Other Lymphedema - excludes congenital, eyelid or vulva 459.33 - Chronic venous hypertension with ulcer and inflammation 707.12 - Ulcer of lower limbs, except pressure ulcer; Ulcer of calf ICD-10 I89.0 - Lymphedema, not elsewhere classified E66.9 - Obesity, unspecified E11.40 - Type 2 diabetes mellitus with diabetic neuropathy, unspecified R26.89 - Other abnormalities of gait and mobility Diagnoses ICD-9 457.1: Other Lymphedema - excludes congenital, eyelid or vulva 459.33: Chronic venous hypertension with ulcer and inflammation 707.12: Ulcer of lower limbs, except pressure ulcer; Ulcer of calf ICD-10 I89.0: Lymphedema, not elsewhere classified E66.9: Obesity, unspecified Patient with venous stasis. Almost healed . Continue compression. Plan Wound Cleansing: Wound #52 Right,Proximal,Anterior Lower Leg: Cleanse wound with mild soap and water Anesthetic: Wound #52 Right,Proximal,Anterior Lower Leg: Topical Lidocaine  4% cream applied to wound bed prior to debridement Primary Wound Dressing: Wound #52 Right,Proximal,Anterior Lower Leg: Iodoflex Secondary Dressing: Conform/Kerlix - to build up ankle Non-adherent pad Dressing Change Frequency: Wound #52 Right,Proximal,Anterior Lower Leg: Change dressing every week [name], [name] ([id number]) Follow-up Appointments: Wound #52 Right,Proximal,Anterior Lower Leg: Return Appointment in 1 week. Edema Control: Wound #52 Right,Proximal,Anterior Lower Leg: 4 Layer Compression System - Bilateral Follow-Up  Appointments: A follow-up appointment should be scheduled. Electronic Signature(s) Signed: 02/03/2015 4:32:21 PM By: Lorine Bears RCP, RRT, CHT Previous Signature: 01/21/2015 12:39:18 PM Version By: Judene Companion MD Entered By: Lorine Bears on 02/03/2015 16:32:20 [name] ([id number]) -------------------------------------------------------------------------------- SuperBill Details Patient Name: [name] Date of Service: 01/21/2015 Medical Record Number: [id number] Patient Account Number: [id number] Date of Birth/Sex: [birth year] (59 y.o. Female) Treating RN: Afful, RN, BSN, Velva Harman Primary Care Physician: [name] Other Clinician: Referring Physician: [name] Treating Physician/Extender: Benjaman Pott in Treatment: 09/30/93 Diagnosis Coding ICD-9 Codes Code Description 457.1 Other Lymphedema - excludes congenital, eyelid or vulva 459.33 Chronic venous hypertension with ulcer and inflammation 707.12 Ulcer of lower limbs, except pressure ulcer; Ulcer of calf ICD-10 Codes Code Description I89.0 Lymphedema, not elsewhere classified E66.9 Obesity, unspecified Facility Procedures CPT4: Description Modifier Quantity Code [id number] BILATERAL: Application of multi-layer venous compression 1 system; leg (below knee), including ankle and foot. Electronic Signature(s) Signed: 01/21/2015 4:44:36 PM By: Judene Companion MD Previous Signature: 01/21/2015 12:43:00 PM Version By: Judene Companion MD Entered By: Judene Companion on 01/21/2015 16:44:35

## 2015-01-29 NOTE — Progress Notes (Signed)
ELDORIS, UPSHAW ([id number]) Visit Report for 01/21/2015 Arrival Information Details Patient Name: [name], [name]. Date of Service: 01/21/2015 8:00 AM Medical Record Number: [id number] Patient Account Number: [id number] Date of Birth/Sex: August 04, 1955 (59 y.o. Female) Treating RN: Montey Hora Primary Care Physician: [name] Other Clinician: Referring Physician: [name] Treating Physician/Extender: Benjaman Pott in Treatment: 95 Visit Information History Since Last Visit Added or deleted any medications: No Patient Arrived: Wheel Chair Any new allergies or adverse reactions: No Arrival Time: 08:29 Had a fall or experienced change in No Accompanied By: self activities of daily living that may affect Transfer Assistance: None risk of falls: Patient Identification Verified: Yes Signs or symptoms of abuse/neglect since last No Secondary Verification Process Yes visito Completed: Hospitalized since last visit: No Patient Requires Transmission- No Pain Present Now: No Based Precautions: Patient Has Alerts: Yes Patient Alerts: Patient on Blood Thinner Electronic Signature(s) Signed: 01/21/2015 4:34:46 PM By: Regan Lemming BSN, RN Entered By: Regan Lemming on 01/21/2015 10:19:23 [name] ([id number]) -------------------------------------------------------------------------------- Encounter Discharge Information Details Patient Name: [name]. Date of Service: 01/21/2015 8:00 AM Medical Record Number: [id number] Patient Account Number: [id number] Date of Birth/Sex: March 30, 1956 (59 y.o. Female) Treating RN: Primary Care Physician: [name] Other Clinician: Referring Physician: [name] Treating Physician/Extender: BURNS III, WALTER Weeks in Treatment: 95 Encounter Discharge Information Items Discharge Pain Level: 0 Discharge Condition: Stable Ambulatory Status: Wheelchair Discharge Destination:  Home Private Transportation: Auto Accompanied By: self Schedule Follow-up Appointment: Yes Medication Reconciliation completed and No provided to Patient/Care Naiah Donahoe: Clinical Summary of Care: Electronic Signature(s) Signed: 01/21/2015 12:13:59 PM By: Montey Hora Entered By: Montey Hora on 01/21/2015 12:13:59 [name] ([id number]) -------------------------------------------------------------------------------- Lower Extremity Assessment Details Patient Name: [name] Date of Service: 01/21/2015 8:00 AM Medical Record Number: [id number] Patient Account Number: [id number] Date of Birth/Sex: Sep 16, 1955 (59 y.o. Female) Treating RN: Montey Hora Primary Care Physician: [name] Other Clinician: Referring Physician: [name] Treating Physician/Extender: Judene Companion Weeks in Treatment: 95 Edema Assessment Assessed: [Left: No] [Right: No] Edema: [Left: Yes] [Right: Yes] Calf Left: Right: Point of Measurement: 24 cm From Medial Instep 45.3 cm 40.6 cm Ankle Left: Right: Point of Measurement: 8 cm From Medial Instep 25.5 cm 25 cm Vascular Assessment Pulses: Posterior Tibial Dorsalis Pedis Palpable: [Left:Yes] [Right:Yes] Extremity colors, hair growth, and conditions: Extremity Color: [Left:Hyperpigmented] [Right:Hyperpigmented] Hair Growth on Extremity: [Left:No] [Right:No] Temperature of Extremity: [Left:Warm] [Right:Warm] Capillary Refill: [Left:< 3 seconds] [Right:< 3 seconds] Toe Nail Assessment Left: Right: Thick: Yes Yes Discolored: Yes Yes Deformed: Yes Yes Improper Length and Hygiene: No No Electronic Signature(s) Signed: 01/21/2015 4:34:46 PM By: Regan Lemming BSN, RN Signed: 01/21/2015 4:40:42 PM By: Montey Hora Entered By: Regan Lemming on 01/21/2015 10:19:58 [name] ([id number]) [name], [name] ([id number]) -------------------------------------------------------------------------------- Multi Wound Chart  Details Patient Name: [name]. Date of Service: 01/21/2015 8:00 AM Medical Record Number: [id number] Patient Account Number: [id number] Date of Birth/Sex: 15-Jul-1955 (59 y.o. Female) Treating RN: Montey Hora Primary Care Physician: [name] Other Clinician: Referring Physician: [name] Treating Physician/Extender: Judene Companion Weeks in Treatment: 95 Vital Signs Height(in): Pulse(bpm): 70 Weight(lbs): Blood Pressure 171/72 (mmHg): Body Mass Index(BMI): Temperature(F): 98.3 Respiratory Rate 18 (breaths/min): Photos: [52:No Photos] [N/A:N/A] Wound Location: [52:Right Lower Leg - Anterior, Proximal] [N/A:N/A] Wounding Event: [52:Blister] [N/A:N/A] Primary Etiology: [52:Lymphedema] [N/A:N/A] Comorbid History: [52:Lymphedema, Hypertension, Type II Diabetes] [N/A:N/A] Date Acquired: [52:01/21/2015] [N/A:N/A] Weeks of Treatment: [52:0] [N/A:N/A] Wound Status: [52:Open] [N/A:N/A] Measurements L x W x D 1.7x4.3x0.1 [  N/A:N/A] (cm) Area (cm) : [52:5.741] [N/A:N/A] Volume (cm) : [52:0.574] [N/A:N/A] % Reduction in Area: [52:0.00%] [N/A:N/A] % Reduction in Volume: 0.00% [N/A:N/A] Classification: [52:Partial Thickness] [N/A:N/A] HBO Classification: [52:Grade 1] [N/A:N/A] Exudate Amount: [52:Small] [N/A:N/A] Exudate Type: [52:Serous] [N/A:N/A] Exudate Color: [52:amber] [N/A:N/A] Wound Margin: [52:Flat and Intact] [N/A:N/A] Granulation Amount: [52:Small (1-33%)] [N/A:N/A] Granulation Quality: [52:Red] [N/A:N/A] Necrotic Amount: [52:None Present (0%)] [N/A:N/A] Exposed Structures: [52:Fascia: No Fat: No Tendon: No Muscle: No] [N/A:N/A] Joint: No Bone: No Limited to Skin Breakdown Epithelialization: Large (67-100%) N/A N/A Periwound Skin Texture: Edema: No N/A N/A Excoriation: No Induration: No Callus: No Crepitus: No Fluctuance: No Friable: No Rash: No Scarring: No Periwound Skin Maceration: No N/A N/A Moisture: Moist: No Dry/Scaly:  No Periwound Skin Color: Atrophie Blanche: No N/A N/A Cyanosis: No Ecchymosis: No Erythema: No Hemosiderin Staining: No Mottled: No Pallor: No Rubor: No Temperature: No Abnormality N/A N/A Tenderness on No N/A N/A Palpation: Wound Preparation: Ulcer Cleansing: Other: N/A N/A soap and water Topical Anesthetic Applied: Other: lidocaine 4% Treatment Notes Wound #52 (Right, Proximal, Anterior Lower Leg) 1. Cleansed with: Cleanse wound with antibacterial soap and water 4. Dressing Applied: Iodoflex 5. Secondary Dressing Applied Kerlix/Conform Non-Adherent pad 7. Secured with Tape 4 Layer Compression System - Bilateral [name], [name] ([id number]) Electronic Signature(s) Signed: 01/21/2015 4:34:46 PM By: Regan Lemming BSN, RN Entered By: Regan Lemming on 01/21/2015 10:20:49 [name] ([id number]) -------------------------------------------------------------------------------- Multi-Disciplinary Care Plan Details Patient Name: [name] Date of Service: 01/21/2015 8:00 AM Medical Record Number: [id number] Patient Account Number: [id number] Date of Birth/Sex: 27-Mar-1956 (59 y.o. Female) Treating RN: Montey Hora Primary Care Physician: [name] Other Clinician: Referring Physician: [name] Treating Physician/Extender: Benjaman Pott in Treatment: 95 Active Inactive Nutrition Nursing Diagnoses: Impaired glucose control: actual or potential Goals: Patient/caregiver verbalizes understanding of need to maintain therapeutic glucose control per primary care physician Date Initiated: 12/16/2013 Goal Status: Active Interventions: Provide education on elevated blood sugars and impact on wound healing Notes: Soft Tissue Infection Nursing Diagnoses: Impaired tissue integrity Goals: Patient will remain free of wound infection Date Initiated: 12/16/2013 Goal Status: Active Interventions: Assess signs and symptoms of infection every  visit Notes: Venous Leg Ulcer Nursing Diagnoses: Knowledge deficit related to disease process and management Potential for venous Insuffiency (use before diagnosis confirmed) GoalsLOANNA, [name] ([id number]) Patient will maintain optimal edema control Date Initiated: 08/18/2014 Goal Status: Active Interventions: Assess peripheral edema status every visit. Compression as ordered Treatment Activities: Therapeutic compression applied : 01/21/2015 Notes: Electronic Signature(s) Signed: 01/21/2015 4:34:46 PM By: Regan Lemming BSN, RN Signed: 01/21/2015 4:40:42 PM By: Montey Hora Entered By: Regan Lemming on 01/21/2015 10:20:34 [name] ([id number]) -------------------------------------------------------------------------------- Pain Assessment Details Patient Name: [name]. Date of Service: 01/21/2015 8:00 AM Medical Record Number: [id number] Patient Account Number: [id number] Date of Birth/Sex: [birth year] (59 y.o. Female) Treating RN: Montey Hora Primary Care Physician: [name] Other Clinician: Referring Physician: [name] Treating Physician/Extender: Judene Companion Weeks in Treatment: 95 Active Problems Location of Pain Severity and Description of Pain Patient Has Paino No Site Locations Pain Management and Medication Current Pain Management: Electronic Signature(s) Signed: 01/21/2015 4:34:46 PM By: Regan Lemming BSN, RN Signed: 01/21/2015 4:40:42 PM By: Montey Hora Entered By: Regan Lemming on 01/21/2015 10:19:41 [name] ([id number]) -------------------------------------------------------------------------------- Patient/Caregiver Education Details Patient Name: [name] Date of Service: 01/21/2015 8:00 AM Medical Record Number: [id number] Patient Account Number: [id number] Date of Birth/Gender: 09-Feb-1956 (59 y.o. Female) Treating RN: Primary Care Physician: [name]  Other Clinician: Referring Physician: [name] Treating Physician/Extender: BURNS III, Charlean Sanfilippo in Treatment: 95 Education Assessment Education Provided To: Patient Education Topics Provided Nutrition: Handouts: Other: nutrition and wound healing Methods: Explain/Verbal Responses: State content correctly Electronic Signature(s) Signed: 01/21/2015 12:14:34 PM By: Montey Hora Entered By: Montey Hora on 01/21/2015 12:14:34 [name] ([id number]) -------------------------------------------------------------------------------- Wound Assessment Details Patient Name: [name]. Date of Service: 01/21/2015 8:00 AM Medical Record Number: [id number] Patient Account Number: [id number] Date of Birth/Sex: 01-26-1956 (59 y.o. Female) Treating RN: Montey Hora Primary Care Physician: [name] Other Clinician: Referring Physician: [name] Treating Physician/Extender: BURNS III, WALTER Weeks in Treatment: 95 Wound Status Wound Number: 52 Primary Lymphedema Etiology: Wound Location: Right Lower Leg - Anterior, Proximal Wound Status: Open Wounding Event: Blister Comorbid Lymphedema, Hypertension, Type II History: Diabetes Date Acquired: 01/21/2015 Weeks Of Treatment: 0 Clustered Wound: No Photos Photo Uploaded By: Montey Hora on 01/21/2015 13:08:23 Wound Measurements Length: (cm) 1.7 Width: (cm) 4.3 Depth: (cm) 0.1 Area: (cm) 5.741 Volume: (cm) 0.574 % Reduction in Area: 0% % Reduction in Volume: 0% Epithelialization: Large (67-100%) Tunneling: No Undermining: No Wound Description Classification: Partial Thickness Foul Odor A Diabetic Severity (Wagner): Grade 1 Wound Margin: Flat and Intact Exudate Amount: Small Exudate Type: Serous Exudate Color: amber fter Cleansing: No Wound Bed Granulation Amount: Small (1-33%) Exposed Structure Granulation Quality: Red Fascia Exposed: No [name], [name] ([id number]) Necrotic Amount: None Present (0%) Fat Layer Exposed:  No Tendon Exposed: No Muscle Exposed: No Joint Exposed: No Bone Exposed: No Limited to Skin Breakdown Periwound Skin Texture Texture Color No Abnormalities Noted: No No Abnormalities Noted: No Callus: No Atrophie Blanche: No Crepitus: No Cyanosis: No Excoriation: No Ecchymosis: No Fluctuance: No Erythema: No Friable: No Hemosiderin Staining: No Induration: No Mottled: No Localized Edema: No Pallor: No Rash: No Rubor: No Scarring: No Temperature / Pain Moisture Temperature: No Abnormality No Abnormalities Noted: No Dry / Scaly: No Maceration: No Moist: No Wound Preparation Ulcer Cleansing: Other: soap and water, Topical Anesthetic Applied: Other: lidocaine 4%, Treatment Notes Wound #52 (Right, Proximal, Anterior Lower Leg) 1. Cleansed with: Cleanse wound with antibacterial soap and water 4. Dressing Applied: Iodoflex 5. Secondary Dressing Applied Kerlix/Conform Non-Adherent pad 7. Secured with Tape 4 Layer Compression System - Bilateral Electronic Signature(s) Signed: 01/21/2015 4:40:42 PM By: Montey Hora Entered By: Montey Hora on 01/21/2015 08:52:22 [name] ([name] ([id number]) -------------------------------------------------------------------------------- Vitals Details Patient Name: [name] Date of Service: 01/21/2015 8:00 AM Medical Record Number: [id number] Patient Account Number: [id number] Date of Birth/Sex: Oct 15, 1955 (59 y.o. Female) Treating RN: Montey Hora Primary Care Physician: [name] Other Clinician: Referring Physician: [name] Treating Physician/Extender: Judene Companion Weeks in Treatment: 95 Vital Signs Time Taken: 08:30 Temperature (F): 98.3 Pulse (bpm): 70 Respiratory Rate (breaths/min): 18 Blood Pressure (mmHg): 171/72 Reference Range: 80 - 120 mg / dl Electronic Signature(s) Signed: 01/21/2015 4:34:46 PM By: Regan Lemming BSN, RN Entered By: Regan Lemming on  01/21/2015 10:19:50

## 2015-02-02 ENCOUNTER — Ambulatory Visit: Payer: Managed Care, Other (non HMO) | Admitting: Surgery

## 2015-02-04 ENCOUNTER — Encounter: Payer: Managed Care, Other (non HMO) | Attending: Surgery | Admitting: Surgery

## 2015-02-04 DIAGNOSIS — R2689 Other abnormalities of gait and mobility: Secondary | ICD-10-CM | POA: Diagnosis not present

## 2015-02-04 DIAGNOSIS — I83012 Varicose veins of right lower extremity with ulcer of calf: Secondary | ICD-10-CM | POA: Diagnosis not present

## 2015-02-04 DIAGNOSIS — E1140 Type 2 diabetes mellitus with diabetic neuropathy, unspecified: Secondary | ICD-10-CM | POA: Insufficient documentation

## 2015-02-04 DIAGNOSIS — I890 Lymphedema, not elsewhere classified: Secondary | ICD-10-CM | POA: Diagnosis not present

## 2015-02-04 DIAGNOSIS — L97219 Non-pressure chronic ulcer of right calf with unspecified severity: Secondary | ICD-10-CM | POA: Diagnosis not present

## 2015-02-04 DIAGNOSIS — E669 Obesity, unspecified: Secondary | ICD-10-CM | POA: Diagnosis not present

## 2015-02-05 NOTE — Progress Notes (Signed)
RYNEISHA, DEBSKI ([id number]) Visit Report for 02/04/2015 Chief Complaint Document Details Patient Name: [name], [name]. Date of Service: 02/04/2015 8:00 AM Medical Record Number: [id number] Patient Account Number: [id number] Date of Birth/Sex: 1956/05/23 (59 y.o. Female) Treating RN: Primary Care Physician: [name] Other Clinician: Referring Physician: [name] Treating Physician/Extender: BURNS III, Charlean Sanfilippo in Treatment: 97 Information Obtained from: Patient Chief Complaint Bilateral lower extremity phlebolymphedema and ulcerations since May 2014 (healed on left, new ulcer on right). Electronic Signature(s) Signed: 02/04/2015 2:26:54 PM By: Loletha Grayer MD Entered By: Loletha Grayer on 02/04/2015 11:51:18 [name] ([id number]) -------------------------------------------------------------------------------- HPI Details Patient Name: [name] Date of Service: 02/04/2015 8:00 AM Medical Record Number: [id number] Patient Account Number: [id number] Date of Birth/Sex: October 08, 1955 (59 y.o. Female) Treating RN: Primary Care Physician: [name] Other Clinician: Referring Physician: [name] Treating Physician/Extender: BURNS III, Johnjoseph Rolfe Weeks in Treatment: 98 History of Present Illness HPI Description: 59yo w/ h/o BLE phlebolymphedema, type 2 DM, and obesity. No PVD. h/o chronic, recurrent BLE calf ulcers. Tolerating 4 layer compression. Ulcers previously healed, but recurred with juxtalite. Infrequently uses lymphedema pump. Bilateral lower extremity ulcerations again healed as of June 2016. Fitted for custom compression stockings but not yet received. Awaiting lymphedema consult. She returns to clinic today and as a new ulceration on her right medial calf. No significant pain. No fever or chills. No significant drainage. Electronic Signature(s) Signed: 02/04/2015 2:26:54 PM By: Loletha Grayer MD Entered By: Loletha Grayer on 02/04/2015 11:52:11 [name] ([id number]) -------------------------------------------------------------------------------- Physical Exam Details Patient Name: [name] Date of Service: 02/04/2015 8:00 AM Medical Record Number: [id number] Patient Account Number: [id number] Date of Birth/Sex: 12/27/1955 (59 y.o. Female) Treating RN: Primary Care Physician: [name] Other Clinician: Referring Physician: [name] Treating Physician/Extender: BURNS III, Makayla Lanter Weeks in Treatment: 97 Constitutional . Pulse regular. Respirations normal and unlabored. Afebrile. Marland Kitchen Respiratory WNL. No retractions.. Cardiovascular Pedal Pulses WNL. Integumentary (Hair, Skin) .Marland Kitchen Neurological . Psychiatric Judgement and insight Intact.. Oriented times 3.. No evidence of depression, anxiety, or agitation.. Notes New right medial calf ulceration. Partial-thickness. Biofilm wiped free with gauze. No surgical debridement required. Otherwise, no ulcerations. Edema well controlled. No evidence for infection. Palpable DP bilaterally. No toe ulcerations. Electronic Signature(s) Signed: 02/04/2015 2:26:54 PM By: Loletha Grayer MD Entered By: Loletha Grayer on 02/04/2015 11:53:06 [name] ([id number]) -------------------------------------------------------------------------------- Physician Orders Details Patient Name: [name] Date of Service: 02/04/2015 8:00 AM Medical Record Number: [id number] Patient Account Number: [id number] Date of Birth/Sex: [birth year] (59 y.o. Female) Treating RN: Montey Hora Primary Care Physician: [name] Other Clinician: Referring Physician: [name] Treating Physician/Extender: BURNS III, Charlean Sanfilippo in Treatment: 49 Verbal / Phone Orders: Yes Clinician: Montey Hora Read Back and Verified: Yes Diagnosis Coding Secondary Dressing o Conform/Kerlix - to build up ankle Dressing Change Frequency o  Change dressing every week Follow-up Appointments o Return Appointment in 1 week. Edema Control o 4 Layer Compression System - Bilateral Electronic Signature(s) Signed: 02/04/2015 2:26:54 PM By: Loletha Grayer MD Signed: 02/04/2015 2:39:43 PM By: Montey Hora Entered By: Montey Hora on 02/04/2015 08:52:29 [name] ([id number]) -------------------------------------------------------------------------------- Problem List Details Patient Name: [name]. Date of Service: 02/04/2015 8:00 AM Medical Record Number: [id number] Patient Account Number: [id number] Date of Birth/Sex: 04-Oct-1955 (59 y.o. Female) Treating RN: Primary Care Physician: [name] Other Clinician: Referring Physician: [name] Treating Physician/Extender: BURNS III, Cannie Muckle Weeks in Treatment: 36 Active Problems ICD-9  Encounter Code Description Active Date Diagnosis 457.1 Other Lymphedema - excludes congenital, eyelid or vulva 03/27/2013 Yes 459.33 Chronic venous hypertension with ulcer and inflammation 03/27/2013 Yes 707.12 Ulcer of lower limbs, except pressure ulcer; Ulcer of calf 03/27/2013 Yes ICD-10 Encounter Code Description Active Date Diagnosis I89.0 Lymphedema, not elsewhere classified 03/26/2014 Yes E66.9 Obesity, unspecified 03/26/2014 Yes E11.40 Type 2 diabetes mellitus with diabetic neuropathy, 01/14/2015 Yes unspecified R26.89 Other abnormalities of gait and mobility 01/14/2015 Yes Inactive Problems Resolved Problems ICD-10 Code Description Active Date Resolved Date 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 Date of Service: 02/04/2015 8:00 AM Medical Record Number: [id number] Patient Account Number: [id number] Date of Birth/Sex: 1955-07-14 (59 y.o. Female) Treating RN: Primary Care Physician: [name] Other Clinician: Referring Physician: [name] Treating Physician/Extender: BURNS III, Charlean Sanfilippo in Treatment: 97 Subjective Chief Complaint Information obtained from Patient Bilateral lower extremity phlebolymphedema and ulcerations since May 2014 (healed on left, new ulcer on right). History of Present Illness (HPI) 59yo w/ h/o BLE phlebolymphedema, type 2 DM, and obesity. No PVD. h/o chronic, recurrent BLE calf ulcers. Tolerating 4 layer compression. Ulcers previously healed, but recurred with juxtalite. Infrequently uses lymphedema pump. Bilateral lower extremity ulcerations again healed as of June 2016. Fitted for custom compression stockings but not yet received. Awaiting lymphedema consult. She returns to clinic today and as a new ulceration on her right medial calf. No significant pain. No fever or chills. No significant drainage. Objective Constitutional Pulse regular. Respirations normal and unlabored. Afebrile. Vitals Time Taken: 8:26 AM, Temperature: 98.4 F, Pulse: 65 bpm, Respiratory Rate: 18 breaths/min, Blood Pressure: 141/53 mmHg. Respiratory WNL. No retractions.. Cardiovascular Pedal Pulses WNL. Psychiatric JACINTA, GAWRYCH ([id number]) Judgement and insight Intact.. Oriented times 3.. No evidence of depression, anxiety, or agitation.. General Notes: New right medial calf ulceration. Partial-thickness. Biofilm wiped free with gauze. No surgical debridement required. Otherwise, no ulcerations. Edema well  controlled. No evidence for infection. Palpable DP bilaterally. No toe ulcerations. Integumentary (Hair, Skin) Wound #52 status is Healed - Epithelialized. Original cause of wound was Blister. The wound is located on the Right,Proximal,Anterior Lower Leg. The wound measures 0cm length x 0cm width x 0cm depth; 0cm^2 area and 0cm^3 volume. Assessment Active Problems ICD-9 457.1 - Other Lymphedema - excludes congenital, eyelid or vulva 459.33 - Chronic venous hypertension with ulcer and inflammation 707.12 - Ulcer of lower limbs, except pressure ulcer; Ulcer of calf ICD-10 I89.0 - Lymphedema, not elsewhere classified E66.9 - Obesity, unspecified E11.40 - Type 2 diabetes mellitus with diabetic neuropathy, unspecified R26.89 - Other abnormalities of gait and mobility bilateral lower extremity phlebolymphedema and new right calf ulceration. Plan Secondary Dressing: Conform/Kerlix - to build up ankle Dressing Change Frequency: Change dressing every week Follow-up Appointments: Return Appointment in 1 week. Edema Control: 4 Layer Compression System - Bilateral Pereira, Demetra K. ([id number]) Continue 4-layer compression bandages. I asked the patient to bring her juxtalite compression garments next week, and if she is healed we'll plan on applying juxtalite. It appears that her compression stockings are not going to arrive, and we  have been trying to get a lymphedema consult for over one year. This time to transition her to a plan that she can manage at home. Electronic Signature(s) Signed: 02/04/2015 2:26:54 PM By: Loletha Grayer MD Entered By: Loletha Grayer on 02/04/2015 11:54:55 [name] ([id number]) -------------------------------------------------------------------------------- SuperBill Details Patient Name: [name] Date of Service: 02/04/2015 Medical Record Number: [id number] Patient Account Number: [id number] Date of Birth/Sex: 26-May-1956 (59 y.o.  Female) Treating RN: Primary Care Physician: [name] Other Clinician: Referring Physician: [name] Treating Physician/Extender: BURNS III, Kevin Mario Weeks in Treatment: 1995-10-04 Diagnosis Coding ICD-9 Codes Code Description 457.1 Other Lymphedema - excludes congenital, eyelid or vulva 459.33 Chronic venous hypertension with ulcer and inflammation 707.12 Ulcer of lower limbs, except pressure ulcer; Ulcer of calf ICD-10 Codes Code Description I89.0 Lymphedema, not elsewhere classified E66.9 Obesity, unspecified E11.40 Type 2 diabetes mellitus with diabetic neuropathy, unspecified R26.89 Other abnormalities of gait and mobility 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

## 2015-02-05 NOTE — Progress Notes (Signed)
ROSELLE, CUCINELLA ([id number]) Visit Report for 02/04/2015 Arrival Information Details Patient Name: [name], [name]. Date of Service: 02/04/2015 8:00 AM Medical Record Number: [id number] Patient Account Number: [id number] Date of Birth/Sex: Feb 20, 1956 (59 y.o. Female) Treating RN: Montey Hora Primary Care Physician: [name] Other Clinician: Referring Physician: [name] Treating Physician/Extender: BURNS III, Charlean Sanfilippo in Treatment: 10 Visit Information History Since Last Visit Added or deleted any medications: No Patient Arrived: Cane Any new allergies or adverse reactions: No Arrival Time: 08:25 Had a fall or experienced change in No Accompanied By: self activities of daily living that may affect Transfer Assistance: None risk of falls: Patient Identification Verified: Yes Signs or symptoms of abuse/neglect since last No Secondary Verification Process Yes visito Completed: Hospitalized since last visit: No Patient Requires Transmission- No Pain Present Now: No Based Precautions: Patient Has Alerts: Yes Patient Alerts: Patient on Blood Thinner Electronic Signature(s) Signed: 02/04/2015 2:39:43 PM By: Montey Hora Entered By: Montey Hora on 02/04/2015 08:25:59 [name] ([id number]) -------------------------------------------------------------------------------- Encounter Discharge Information Details Patient Name: [name]. Date of Service: 02/04/2015 8:00 AM Medical Record Number: [id number] Patient Account Number: [id number] Date of Birth/Sex: 07/21/1955 (59 y.o. Female) Treating RN: Montey Hora Primary Care Physician: [name] Other Clinician: Referring Physician: [name] Treating Physician/Extender: BURNS III, Charlean Sanfilippo in Treatment: 97 Encounter Discharge Information Items Discharge Pain Level: 0 Discharge Condition: Stable Ambulatory Status: Cane Discharge Destination: Home Transportation: Private  Auto Accompanied By: self Schedule Follow-up Appointment: Yes Medication Reconciliation completed No and provided to Patient/Care Herberto Ledwell: Provided on Clinical Summary of Care: 02/04/2015 Form Type Recipient Paper Patient GI Electronic Signature(s) Signed: 02/04/2015 9:24:30 AM By: Ruthine Dose Entered By: Ruthine Dose on 02/04/2015 09:24:30 [name] ([id number]) -------------------------------------------------------------------------------- Lower Extremity Assessment Details Patient Name: [name] Date of Service: 02/04/2015 8:00 AM Medical Record Number: [id number] Patient Account Number: [id number] Date of Birth/Sex: August 11, 1955 (59 y.o. Female) Treating RN: Montey Hora Primary Care Physician: [name] Other Clinician: Referring Physician: [name] Treating Physician/Extender: BURNS III, Charlean Sanfilippo in Treatment: 97 Edema Assessment Assessed: [Left: No] [Right: No] E[Left: dema] [Right: :] Calf Left: Right: Point of Measurement: 24 cm From Medial Instep 43.3 cm 38.5 cm Ankle Left: Right: Point of Measurement: 8 cm From Medial Instep 24.2 cm 22.3 cm Vascular Assessment Pulses: Posterior Tibial Dorsalis Pedis Palpable: [Left:Yes] [Right:Yes] Extremity colors, hair growth, and conditions: Extremity Color: [Left:Hyperpigmented] [Right:Hyperpigmented] Hair Growth on Extremity: [Left:No] [Right:No] Temperature of Extremity: [Left:Warm] [Right:Warm] Capillary Refill: [Left:< 3 seconds] [Right:< 3 seconds] Toe Nail Assessment Left: Right: Thick: Yes Yes Discolored: Yes Yes Deformed: Yes Yes Improper Length and Hygiene: No No Electronic Signature(s) Signed: 02/04/2015 2:39:43 PM By: Montey Hora Entered By: Montey Hora on 02/04/2015 08:44:46 [name] ([id number]) -------------------------------------------------------------------------------- Multi Wound Chart Details Patient Name: [name]. Date of Service:  02/04/2015 8:00 AM Medical Record Number: [id number] Patient Account Number: [id number] Date of Birth/Sex: 11-29-1955 (59 y.o. Female) Treating RN: Montey Hora Primary Care Physician: [name] Other Clinician: Referring Physician: [name] Treating Physician/Extender: BURNS III, WALTER Weeks in Treatment: 97 Vital Signs Height(in): Pulse(bpm): 65 Weight(lbs): Blood Pressure 141/53 (mmHg): Body Mass Index(BMI): Temperature(F): 98.4 Respiratory Rate 18 (breaths/min): Photos: [52:No Photos] [N/A:N/A] Wound Location: [52:Right, Proximal, Anterior Lower Leg] [N/A:N/A] Wounding Event: [52:Blister] [N/A:N/A] Primary Etiology: [52:Lymphedema] [N/A:N/A] Date Acquired: [52:01/21/2015] [N/A:N/A] Weeks of Treatment: [52:2] [N/A:N/A] Wound Status: [52:Healed - Epithelialized] [N/A:N/A] Measurements L x W x D 0x0x0 [N/A:N/A] (cm) Area (cm) : [52:0] [N/A:N/A] Volume (cm) : [  52:0] [N/A:N/A] % Reduction in Area: [52:100.00%] [N/A:N/A] % Reduction in Volume: 100.00% [N/A:N/A] Classification: [52:Partial Thickness] [N/A:N/A] Periwound Skin Texture: No Abnormalities Noted [N/A:N/A] Periwound Skin [52:No Abnormalities Noted] [N/A:N/A] Moisture: Periwound Skin Color: No Abnormalities Noted [N/A:N/A] Tenderness on [52:No] [N/A:N/A] Treatment Notes Electronic Signature(s) Signed: 02/04/2015 2:39:43 PM By: Montey Hora Entered By: Montey Hora on 02/04/2015 08:51:11 [name] ([id number]) [name] ([id number]) -------------------------------------------------------------------------------- [place] Details Patient Name: [name]. Date of Service: 02/04/2015 8:00 AM Medical Record Number: [id number] Patient Account Number: [id number] Date of Birth/Sex: 1955/08/15 (59 y.o. Female) Treating RN: Montey Hora Primary Care Physician: [name] Other Clinician: Referring Physician: [name] Treating Physician/Extender:  BURNS III, Charlean Sanfilippo in Treatment: 36 Active Inactive Nutrition Nursing Diagnoses: Impaired glucose control: actual or potential Goals: Patient/caregiver verbalizes understanding of need to maintain therapeutic glucose control per primary care physician Date Initiated: 12/16/2013 Goal Status: Active Interventions: Provide education on elevated blood sugars and impact on wound healing Notes: Soft Tissue Infection Nursing Diagnoses: Impaired tissue integrity Goals: Patient will remain free of wound infection Date Initiated: 12/16/2013 Goal Status: Active Interventions: Assess signs and symptoms of infection every visit Notes: Venous Leg Ulcer Nursing Diagnoses: Knowledge deficit related to disease process and management Potential for venous Insuffiency (use before diagnosis confirmed) GoalsSAYRA, FOCO ([id number]) Patient will maintain optimal edema control Date Initiated: 08/18/2014 Goal Status: Active Interventions: Assess peripheral edema status every visit. Compression as ordered Treatment Activities: Therapeutic compression applied : 02/04/2015 Notes: Electronic Signature(s) Signed: 02/04/2015 2:39:43 PM By: Montey Hora Entered By: Montey Hora on 02/04/2015 08:50:40 [name] ([id number]) -------------------------------------------------------------------------------- Non-Wound Condition Assessment Details Patient Name: [name]. Date of Service: 02/04/2015 8:00 AM Medical Record Number: [id number] Patient Account Number: [id number] Date of Birth/Sex: 11-28-1955 (59 y.o. Female) Treating RN: Montey Hora Primary Care Physician: [name] Other Clinician: Referring Physician: [name] Treating Physician/Extender: BURNS III, WALTER Weeks in Treatment: 97 Non-Wound Condition: Condition: Lymphedema Location: Leg Side: Left Periwound Skin Texture Texture Color No Abnormalities Noted: No No Abnormalities Noted:  No Callus: No Atrophie Blanche: No Crepitus: No Cyanosis: No Excoriation: No Ecchymosis: No Fluctuance: No Erythema: No Friable: No Hemosiderin Staining: Yes Induration: No Mottled: No Localized Edema: No Pallor: No Rash: No Rubor: No Scarring: No Temperature / Pain Moisture Temperature: No Abnormality No Abnormalities Noted: No Dry / Scaly: No Maceration: No Moist: No Notes 4 layer wrap with pink unna as anchor Electronic Signature(s) Signed: 02/04/2015 2:39:43 PM By: Montey Hora Entered By: Montey Hora on 02/04/2015 08:55:05 [name] ([id number]) -------------------------------------------------------------------------------- Non-Wound Condition Assessment Details Patient Name: [name]. Date of Service: 02/04/2015 8:00 AM Medical Record Number: [id number] Patient Account Number: [id number] Date of Birth/Sex: March 06, 1956 (59 y.o. Female) Treating RN: Montey Hora Primary Care Physician: [name] Other Clinician: Referring Physician: [name] Treating Physician/Extender: BURNS III, WALTER Weeks in Treatment: 97 Non-Wound Condition: Condition: Lymphedema Location: Leg Side: Right Periwound Skin Texture Texture Color No Abnormalities Noted: No No Abnormalities Noted: No Callus: No Atrophie Blanche: No Crepitus: No Cyanosis: No Excoriation: No Ecchymosis: No Fluctuance: No Erythema: No Friable: No Hemosiderin Staining: Yes Induration: No Mottled: No Localized Edema: No Pallor: No Rash: No Rubor: No Scarring: No Temperature / Pain Moisture Temperature: No Abnormality No Abnormalities Noted: No Dry / Scaly: No Maceration: No Moist: No Notes 4 layer wrap with pink unna as anchor Electronic Signature(s) Signed: 02/04/2015 2:39:43 PM By: Montey Hora Entered By: Montey Hora on 02/04/2015 08:55:22 Andreas, Ladene K.  (  [id number]) -------------------------------------------------------------------------------- Patient/Caregiver Education Details Patient Name: [name] Date of Service: 02/04/2015 8:00 AM Medical Record Number: [id number] Patient Account Number: [id number] Date of Birth/Gender: Jan 01, 1956 (59 y.o. Female) Treating RN: Montey Hora Primary Care Physician: [name] Other Clinician: Referring Physician: [name] Treating Physician/Extender: BURNS III, Charlean Sanfilippo in Treatment: 63 Education Assessment Education Provided To: Patient Education Topics Provided Venous: Handouts: Other: bring juxtalite next week Methods: Explain/Verbal Responses: State content correctly Electronic Signature(s) Signed: 02/04/2015 2:39:43 PM By: Montey Hora Entered By: Montey Hora on 02/04/2015 08:54:02 [name] ([id number]) -------------------------------------------------------------------------------- Wound Assessment Details Patient Name: [name]. Date of Service: 02/04/2015 8:00 AM Medical Record Number: [id number] Patient Account Number: [id number] Date of Birth/Sex: May 28, 1956 (59 y.o. Female) Treating RN: Montey Hora Primary Care Physician: [name] Other Clinician: Referring Physician: [name] Treating Physician/Extender: BURNS III, WALTER Weeks in Treatment: 97 Wound Status Wound Number: [id number] Primary Etiology: Lymphedema Wound Location: Right, Proximal, Anterior Lower Wound Status: Healed - Epithelialized Leg Wounding Event: Blister Date Acquired: 01/21/2015 Weeks Of Treatment: 2 Clustered Wound: No Photos Photo Uploaded By: Montey Hora on 02/04/2015 11:45:43 Wound Measurements Length: (cm) 0 Width: (cm) 0 Depth: (cm) 0 Area: (cm) 0 Volume: (cm) 0 % Reduction in Area: 100% % Reduction in Volume: 100% Wound Description Classification: Partial Thickness Periwound Skin Texture Texture Color No Abnormalities Noted:  No No Abnormalities Noted: No Moisture No Abnormalities Noted: No Electronic Signature(s) Signed: 02/04/2015 2:39:43 PM By: Margarita Grizzle, [name] ([id number]) Entered By: Montey Hora on 02/04/2015 08:50:23 [name] ([id number]) -------------------------------------------------------------------------------- Vitals Details Patient Name: [name]. Date of Service: 02/04/2015 8:00 AM Medical Record Number: [id number] Patient Account Number: [id number] Date of Birth/Sex: [birth year] (59 y.o. Female) Treating RN: Montey Hora Primary Care Physician: [name] Other Clinician: Referring Physician: [name] Treating Physician/Extender: BURNS III, WALTER Weeks in Treatment: 97 Vital Signs Time Taken: 08:26 Temperature (F): 98.4 Pulse (bpm): 65 Respiratory Rate (breaths/min): 18 Blood Pressure (mmHg): 141/53 Reference Range: 80 - 120 mg / dl Electronic Signature(s) Signed: 02/04/2015 2:39:43 PM By: Montey Hora Entered By: Montey Hora on 02/04/2015 [id number]

## 2015-02-11 ENCOUNTER — Ambulatory Visit: Payer: Managed Care, Other (non HMO) | Admitting: Surgery

## 2015-02-18 ENCOUNTER — Encounter: Payer: Managed Care, Other (non HMO) | Admitting: Surgery

## 2015-02-18 DIAGNOSIS — L97219 Non-pressure chronic ulcer of right calf with unspecified severity: Secondary | ICD-10-CM | POA: Diagnosis not present

## 2015-02-18 NOTE — Progress Notes (Signed)
LAVINE, ASANTE ([id number]) Visit Report for 02/18/2015 Chief Complaint Document Details Patient Name: [name], [name]. Date of Service: 02/18/2015 10:45 AM Medical Record Number: [id number] Patient Account Number: [id number] Date of Birth/Sex: Sep 09, 1955 (59 y.o. Female) Treating RN: Baruch Gouty, RN, BSN, Velva Harman Primary Care Physician: [name] Other Clinician: Referring Physician: [name] Treating Physician/Extender: BURNS III, Charlean Sanfilippo in Treatment: 99 Information Obtained from: Patient Chief Complaint Bilateral lower extremity phlebolymphedema and ulcerations since May 2014 (healed). Electronic Signature(s) Signed: 02/18/2015 3:32:14 PM By: Loletha Grayer MD Entered By: Loletha Grayer on 02/18/2015 12:55:07 [name] ([id number]) -------------------------------------------------------------------------------- HPI Details Patient Name: [name] Date of Service: 02/18/2015 10:45 AM Medical Record Number: [id number] Patient Account Number: [id number] Date of Birth/Sex: [birth year] (59 y.o. Female) Treating RN: Baruch Gouty, RN, BSN, Velva Harman Primary Care Physician: [name] Other Clinician: Referring Physician: [name] Treating Physician/Extender: BURNS III, WALTER Weeks in Treatment: 99 History of Present Illness HPI Description: 58yo w/ h/o BLE phlebolymphedema, type 2 DM, and obesity. No PVD. h/o chronic, recurrent BLE calf ulcers. Tolerating 4 layer compression. Ulcers previously healed, but recurred with juxtalite. Infrequently uses lymphedema pump. Bilateral lower extremity ulcerations again healed as of June 2016. Fitted for custom compression stockings but not yet received. Awaiting lymphedema consult. She returns to clinic today and is without complaints. No significant pain. No fever or chills. No drainage. Electronic Signature(s) Signed: 02/18/2015 3:32:14 PM By: Loletha Grayer MD Entered By: Loletha Grayer on 02/18/2015  12:55:42 [name] ([id number]) -------------------------------------------------------------------------------- Physical Exam Details Patient Name: [name] Date of Service: 02/18/2015 10:45 AM Medical Record Number: [id number] Patient Account Number: [id number] Date of Birth/Sex: Jun 15, 1956 (59 y.o. Female) Treating RN: Baruch Gouty, RN, BSN, Velva Harman Primary Care Physician: [name] Other Clinician: Referring Physician: [name] Treating Physician/Extender: BURNS III, WALTER Weeks in Treatment: 99 Constitutional . Pulse regular. Respirations normal and unlabored. Afebrile. Marland Kitchen Respiratory WNL. No retractions.. Cardiovascular Pedal Pulses WNL. Integumentary (Hair, Skin) .Marland Kitchen Neurological . Psychiatric Judgement and insight Intact.. Oriented times 3.. No evidence of depression, anxiety, or agitation.. Notes Bilateral calf ulcerations are re-epithelialized. No drainage. No infection. 1+ pitting edema bilaterally. Palpable DP bilaterally. Electronic Signature(s) Signed: 02/18/2015 3:32:14 PM By: Loletha Grayer MD Entered By: Loletha Grayer on 02/18/2015 12:56:31 [name] ([id number]) -------------------------------------------------------------------------------- Physician Orders Details Patient Name: [name] Date of Service: 02/18/2015 10:45 AM Medical Record Number: [id number] Patient Account Number: [id number] Date of Birth/Sex: 1956/03/25 (59 y.o. Female) Treating RN: Afful, RN, BSN, Velva Harman Primary Care Physician: [name] Other Clinician: Referring Physician: [name] Treating Physician/Extender: BURNS III, Charlean Sanfilippo in Treatment: 45 Verbal / Phone Orders: Yes Clinician: Afful, RN, BSN, Rita Read Back and Verified: Yes Diagnosis Coding Skin Barriers/Peri-Wound Care o Moisturizing lotion Follow-up Appointments o Return Appointment in: - 3 weeks for final follow up with juxtalite usage. Edema Control o Patient  to wear own Juxtalite compression garment. o Elevate legs to the level of the heart and pump ankles as often as possible Electronic Signature(s) Signed: 02/18/2015 2:45:34 PM By: Regan Lemming BSN, RN Signed: 02/18/2015 3:32:14 PM By: Loletha Grayer MD Entered By: Regan Lemming on 02/18/2015 11:21:36 [name] ([id number]) -------------------------------------------------------------------------------- Problem List Details Patient Name: [name]. Date of Service: 02/18/2015 10:45 AM Medical Record Number: [id number] Patient Account Number: [id number] Date of Birth/Sex: 1955-12-25 (59 y.o. Female) Treating RN: Baruch Gouty, RN, BSN, Velva Harman Primary Care Physician: [name] Other Clinician: Referring Physician: [name] Treating Physician/Extender: BURNS III,  WALTER Weeks in Treatment: 99 Active Problems ICD-9 Encounter Code Description Active Date Diagnosis 457.1 Other Lymphedema - excludes congenital, eyelid or vulva 03/27/2013 Yes 459.33 Chronic venous hypertension with ulcer and inflammation 03/27/2013 Yes 707.12 Ulcer of lower limbs, except pressure ulcer; Ulcer of calf 03/27/2013 Yes ICD-10 Encounter Code Description Active Date Diagnosis I89.0 Lymphedema, not elsewhere classified 03/26/2014 Yes E66.9 Obesity, unspecified 03/26/2014 Yes E11.40 Type 2 diabetes mellitus with diabetic neuropathy, 01/14/2015 Yes unspecified R26.89 Other abnormalities of gait and mobility 01/14/2015 Yes Inactive Problems Resolved Problems ICD-10 Code Description Active Date Resolved Date 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 Date of Service: 02/18/2015 10:45 AM Medical Record Number: [id number] Patient Account Number: [id number] Date of Birth/Sex: 10-Jan-1956 (59 y.o. Female) Treating RN: Baruch Gouty, RN, BSN, Velva Harman Primary Care Physician: [name] Other Clinician: Referring Physician: [name] Treating Physician/Extender: BURNS III, Charlean Sanfilippo in Treatment: 99 Subjective Chief Complaint Information obtained from Patient Bilateral lower extremity phlebolymphedema and ulcerations since May 2014 (healed). History of Present Illness (HPI) 59yo w/ h/o BLE phlebolymphedema, type 2 DM, and obesity. No PVD. h/o chronic, recurrent BLE calf ulcers. Tolerating 4 layer compression. Ulcers previously healed, but recurred with juxtalite. Infrequently uses lymphedema pump. Bilateral lower extremity ulcerations again healed as of June 2016. Fitted for custom compression stockings but not yet received. Awaiting lymphedema consult. She returns to clinic today and is without complaints. No significant pain. No fever or chills. No drainage. Objective Constitutional Pulse regular. Respirations normal and unlabored. Afebrile. Vitals Time Taken: 10:45 AM, Temperature: 98.1 F, Pulse: 51 bpm, Respiratory Rate: 17 breaths/min, Blood Pressure: 164/50 mmHg. Respiratory WNL. No retractions.. Cardiovascular Pedal Pulses WNL. Psychiatric Judgement and insight Intact.. Oriented times 3.. No evidence of depression, anxiety, or agitation.[name] ([id number]) General Notes: Bilateral calf ulcerations are re-epithelialized. No drainage. No infection. 1+ pitting edema bilaterally. Palpable DP bilaterally. Integumentary (Hair, Skin) Assessment Active Problems ICD-9 457.1 -  Other Lymphedema - excludes congenital, eyelid or vulva 459.33 - Chronic venous hypertension with ulcer and inflammation 707.12 - Ulcer of lower limbs, except pressure ulcer; Ulcer of calf ICD-10 I89.0 - Lymphedema, not elsewhere classified E66.9 - Obesity, unspecified E11.40 - Type 2 diabetes mellitus with diabetic neuropathy, unspecified R26.89 - Other abnormalities of gait and mobility Bilateral lower extremity phlebolymphedema and healed ulcerations. Plan Skin Barriers/Peri-Wound Care: Moisturizing lotion Follow-up Appointments: Return Appointment in: - 3 weeks for final follow up with juxtalite usage. Edema Control: Patient to wear own Juxtalite compression garment. Elevate legs to the level of the heart and pump ankles as often as possible Trella, Levenia K. ([id number]) Will transition to juxtalite compression garments. Patient was instructed on how to apply them. Lymphedema pump 2 hours daily. Frequent leg elevation. Return to clinic in 2-3 weeks to make sure she has not developed an ulcer recurrence. Electronic Signature(s) Signed: 02/18/2015 3:32:14 PM By: Loletha Grayer MD Entered By: Loletha Grayer on 02/18/2015 12:57:44 [name] ([id number]) -------------------------------------------------------------------------------- SuperBill Details Patient Name: [name] Date of Service: 02/18/2015 Medical Record Number: [id number] Patient Account Number: [id number]  Date of Birth/Sex: Jun 19, 1956 (59 y.o. Female) Treating RN: Afful, RN, BSN, Velva Harman Primary Care Physician: [name] Other Clinician: Referring Physician: [name] Treating Physician/Extender: BURNS III, Charlean Sanfilippo in Treatment: 10-10-97 Diagnosis Coding ICD-9 Codes Code Description 457.1 Other Lymphedema - excludes congenital, eyelid or vulva 459.33 Chronic venous hypertension with ulcer and inflammation 707.12 Ulcer of lower limbs, except pressure ulcer; Ulcer of calf ICD-10  Codes Code Description I89.0 Lymphedema, not elsewhere classified E66.9 Obesity, unspecified E11.40 Type 2 diabetes mellitus with diabetic neuropathy, unspecified R26.89 Other abnormalities of gait and mobility Facility Procedures CPT4 Code: [id number] Description: [id number] - WOUND CARE VISIT-LEV 1 EST PT Modifier: Quantity: 1 Physician Procedures CPT4 Code: [id number] Description: [id number] - WC PHYS LEVEL 3 - EST PT ICD-10 Description Diagnosis I89.0 Lymphedema, not elsewhere classified Modifier: Quantity: 1 Electronic Signature(s) Signed: 02/18/2015 3:32:14 PM By: Loletha Grayer MD Entered By: Loletha Grayer on 02/18/2015 12:58:06

## 2015-02-18 NOTE — Progress Notes (Signed)
[name], [name] ([id number]) Visit Report for 02/18/2015 Arrival Information Details Patient Name: [name], [name]. Date of Service: 02/18/2015 10:45 AM Medical Record Number: [id number] Patient Account Number: [id number] Date of Birth/Sex: 11-Nov-1955 (59 y.o. Female) Treating RN: Afful, RN, BSN, Velva Harman Primary Care Physician: [name] Other Clinician: Referring Physician: [name] Treating Physician/Extender: BURNS III, Charlean Sanfilippo in Treatment: 99 Visit Information History Since Last Visit Added or deleted any medications: No Patient Arrived: Wheel Chair Any new allergies or adverse reactions: No Arrival Time: 10:40 Had a fall or experienced change in No Accompanied By: friend activities of daily living that may affect Transfer Assistance: Stretcher risk of falls: Patient Identification Verified: Yes Signs or symptoms of abuse/neglect since last No Secondary Verification Process Yes visito Completed: Hospitalized since last visit: No Patient Requires Transmission- No Has Dressing in Place as Prescribed: Yes Based Precautions: Has Compression in Place as Prescribed: Yes Patient Has Alerts: Yes Pain Present Now: No Patient Alerts: Patient on Blood Thinner Electronic Signature(s) Signed: 02/18/2015 2:45:34 PM By: Regan Lemming BSN, RN Entered By: Regan Lemming on 02/18/2015 10:45:46 [name] ([id number]) -------------------------------------------------------------------------------- Clinic Level of Care Assessment Details Patient Name: [name]. Date of Service: 02/18/2015 10:45 AM Medical Record Number: [id number] Patient Account Number: [id number] Date of Birth/Sex: [birth year] (59 y.o. Female) Treating RN: Afful, RN, BSN, Velva Harman Primary Care Physician: [name] Other Clinician: Referring Physician: [name] Treating Physician/Extender: BURNS III, Charlean Sanfilippo in Treatment: 99 Clinic Level of Care Assessment Items TOOL 4 Quantity  Score []  - Use when only an EandM is performed on FOLLOW-UP visit 0 ASSESSMENTS - Nursing Assessment / Reassessment X - Reassessment of Co-morbidities (includes updates in patient status) 1 10 X - Reassessment of Adherence to Treatment Plan 1 5 ASSESSMENTS - Wound and Skin Assessment / Reassessment []  - Simple Wound Assessment / Reassessment - one wound 0 []  - Complex Wound Assessment / Reassessment - multiple wounds 0 []  - Dermatologic / Skin Assessment (not related to wound area) 0 ASSESSMENTS - Focused Assessment []  - Circumferential Edema Measurements - multi extremities 0 []  - Nutritional Assessment / Counseling / Intervention 0 []  - Lower Extremity Assessment (monofilament, tuning fork, pulses) 0 []  - Peripheral Arterial Disease Assessment (using hand held doppler) 0 ASSESSMENTS - Ostomy and/or Continence Assessment and Care []  - Incontinence Assessment and Management 0 []  - Ostomy Care Assessment and Management (repouching, etc.) 0 PROCESS - Coordination of Care X - Simple Patient / Family Education for ongoing care 1 15 []  - Complex (extensive) Patient / Family Education for ongoing care 0 []  - Staff obtains Programmer, systems, Records, Test Results / Process Orders 0 []  - Staff telephones HHA, Nursing Homes / Clarify orders / etc 0 []  - Routine Transfer to another Facility (non-emergent condition) 0 [name], [name] ([id number]) []  - Routine Hospital Admission (non-emergent condition) 0 []  - New Admissions / Biomedical engineer / Ordering NPWT, Apligraf, etc. 0 []  - Emergency Hospital Admission (emergent condition) 0 []  - Simple Discharge Coordination 0 []  - Complex (extensive) Discharge Coordination 0 PROCESS - Special Needs []  - Pediatric / Minor Patient Management 0 []  - Isolation Patient Management 0 []  - Hearing / Language / Visual special needs 0 []  - Assessment of Community assistance (transportation, D/C planning, etc.) 0 []  - Additional assistance / Altered mentation  0 []  - Support Surface(s) Assessment (bed, cushion, seat, etc.) 0 INTERVENTIONS - Wound Cleansing / Measurement []  - Simple Wound Cleansing - one wound 0 []  - Complex Wound  Cleansing - multiple wounds 0 []  - Wound Imaging (photographs - any number of wounds) 0 []  - Wound Tracing (instead of photographs) 0 []  - Simple Wound Measurement - one wound 0 []  - Complex Wound Measurement - multiple wounds 0 INTERVENTIONS - Wound Dressings []  - Small Wound Dressing one or multiple wounds 0 []  - Medium Wound Dressing one or multiple wounds 0 []  - Large Wound Dressing one or multiple wounds 0 []  - Application of Medications - topical 0 []  - Application of Medications - injection 0 INTERVENTIONS - Miscellaneous []  - External ear exam 0 [name], [name] ([id number]) []  - Specimen Collection (cultures, biopsies, blood, body fluids, etc.) 0 []  - Specimen(s) / Culture(s) sent or taken to Lab for analysis 0 []  - Patient Transfer (multiple staff / Harrel Lemon Lift / Similar devices) 0 []  - Simple Staple / Suture removal (25 or less) 0 []  - Complex Staple / Suture removal (26 or more) 0 []  - Hypo / Hyperglycemic Management (close monitor of Blood Glucose) 0 []  - Ankle / Brachial Index (ABI) - do not check if billed separately 0 X - Vital Signs 1 5 Has the patient been seen at the hospital within the last three years: Yes Total Score: 35 Level Of Care: New/Established - Level 1 Electronic Signature(s) Signed: 02/18/2015 2:45:34 PM By: Regan Lemming BSN, RN Entered By: Regan Lemming on 02/18/2015 11:22:09 [name] ([id number]) -------------------------------------------------------------------------------- Encounter Discharge Information Details Patient Name: [name]. Date of Service: 02/18/2015 10:45 AM Medical Record Number: [id number] Patient Account Number: [id number] Date of Birth/Sex: 1956/03/31 (59 y.o. Female) Treating RN: Baruch Gouty, RN, BSN, Velva Harman Primary Care Physician: [name] Other Clinician: Referring Physician: [name] Treating Physician/Extender: BURNS III, Charlean Sanfilippo in Treatment: 99 Encounter Discharge Information Items Schedule Follow-up Appointment: No Medication Reconciliation completed No and provided to Patient/Care Ademola Vert: Provided on Clinical Summary of Care: 02/18/2015 Form Type Recipient Paper Patient GI Electronic Signature(s) Signed: 02/18/2015 11:19:06 AM By: Ruthine Dose Entered By: Ruthine Dose on 02/18/2015 11:19:06 [name] ([id number]) -------------------------------------------------------------------------------- Lower Extremity Assessment Details Patient Name: [name]. Date of Service: 02/18/2015 10:45 AM Medical Record Number: [id number] Patient Account Number: [id number] Date of Birth/Sex: 11-17-1955 (59 y.o. Female) Treating RN: Afful, RN, BSN, Luce Primary Care Physician: [name] Other Clinician: Referring Physician: [name] Treating Physician/Extender: BURNS III, Charlean Sanfilippo in Treatment: 99 Edema Assessment Assessed: [Left: No] [Right: No] E[Left: dema] [Right: :] Calf Left: Right: Point of Measurement: 24 cm From Medial Instep 42.5 cm 38.5 cm Ankle Left: Right: Point of Measurement: 8 cm From Medial Instep 22.6 cm 22 cm Vascular Assessment Claudication: Claudication Assessment [Left:None] [Right:None] Pulses: Posterior Tibial Dorsalis Pedis Palpable: [Left:Yes] [Right:Yes] Extremity colors, hair growth, and conditions: Extremity Color: [Left:Mottled] [Right:Mottled] Hair Growth on Extremity: [Left:No] [Right:No] Temperature of Extremity: [Left:Warm] [Right:Warm] Capillary Refill: [Left:< 3 seconds] [Right:< 3 seconds] Toe Nail Assessment Left: Right: Thick: Yes Yes Discolored: Yes Yes Deformed: No No Improper Length and Hygiene: No No Electronic Signature(s) Signed: 02/18/2015 2:45:34 PM By: Regan Lemming BSN, RN Entered By: Regan Lemming on 02/18/2015  10:47:36 [name] ([id number] [name], [name] ([id number]) -------------------------------------------------------------------------------- Multi Wound Chart Details Patient Name: [name]. Date of Service: 02/18/2015 10:45 AM Medical Record Number: [id number] Patient Account Number: [id number] Date of Birth/Sex: [birth year] (59 y.o. Female) Treating RN: Baruch Gouty, RN, BSN, Velva Harman Primary Care Physician: [name] Other Clinician: Referring Physician: [name] Treating Physician/Extender: BURNS III, [name] in Treatment: 99 Vital Signs Height(in):  Pulse(bpm): 51 Weight(lbs): Blood Pressure 164/50 (mmHg): Body Mass Index(BMI): Temperature(F): 98.1 Respiratory Rate 17 (breaths/min): Wound Assessments Treatment Notes Electronic Signature(s) Signed: 02/18/2015 2:45:34 PM By: Regan Lemming BSN, RN Entered By: Regan Lemming on 02/18/2015 11:04:06 [name] ([id number]) -------------------------------------------------------------------------------- Multi-Disciplinary Care Plan Details Patient Name: [name] Date of Service: 02/18/2015 10:45 AM Medical Record Number: [id number] Patient Account Number: [id number] Date of Birth/Sex: July 03, 1955 (59 y.o. Female) Treating RN: Afful, RN, BSN, Velva Harman Primary Care Physician: [name] Other Clinician: Referring Physician: [name] Treating Physician/Extender: BURNS III, Charlean Sanfilippo in Treatment: 99 Active Inactive Nutrition Nursing Diagnoses: Impaired glucose control: actual or potential Goals: Patient/caregiver verbalizes understanding of need to maintain therapeutic glucose control per primary care physician Date Initiated: 12/16/2013 Goal Status: Active Interventions: Provide education on elevated blood sugars and impact on wound healing Notes: Soft Tissue Infection Nursing Diagnoses: Impaired tissue integrity Goals: Patient will remain free of wound infection Date  Initiated: 12/16/2013 Goal Status: Active Interventions: Assess signs and symptoms of infection every visit Notes: Venous Leg Ulcer Nursing Diagnoses: Knowledge deficit related to disease process and management Potential for venous Insuffiency (use before diagnosis confirmed) GoalsMAKIYLA, [name] ([id number]) Patient will maintain optimal edema control Date Initiated: 08/18/2014 Goal Status: Active Interventions: Assess peripheral edema status every visit. Compression as ordered Treatment Activities: Therapeutic compression applied : 02/18/2015 Notes: Electronic Signature(s) Signed: 02/18/2015 2:45:34 PM By: Regan Lemming BSN, RN Entered By: Regan Lemming on 02/18/2015 11:03:57 [name] ([id number]) -------------------------------------------------------------------------------- Pain Assessment Details Patient Name: [name]. Date of Service: 02/18/2015 10:45 AM Medical Record Number: [id number] Patient Account Number: [id number] Date of Birth/Sex: [birth year] (59 y.o. Female) Treating RN: Baruch Gouty, RN, BSN, Velva Harman Primary Care Physician: [name] Other Clinician: Referring Physician: [name] Treating Physician/Extender: BURNS III, Charlean Sanfilippo in Treatment: 99 Active Problems Location of Pain Severity and Description of Pain Patient Has Paino No Site Locations Pain Management and Medication Current Pain Management: Electronic Signature(s) Signed: 02/18/2015 2:45:34 PM By: Regan Lemming BSN, RN Entered By: Regan Lemming on 02/18/2015 10:45:54 [name] ([id number]) -------------------------------------------------------------------------------- Vitals Details Patient Name: [name]. Date of Service: 02/18/2015 10:45 AM Medical Record Number: [id number] Patient Account Number: [id number] Date of Birth/Sex: 1956/05/22 (59 y.o. Female) Treating RN: Afful, RN, BSN, Velva Harman Primary Care Physician: [name] Other Clinician: Referring  Physician: [name] Treating Physician/Extender: BURNS III, [name] in Treatment: 99 Vital Signs Time Taken: 10:45 Temperature (F): 98.1 Pulse (bpm): 51 Respiratory Rate (breaths/min): 17 Blood Pressure (mmHg): 164/50 Reference Range: 80 - 120 mg / dl Electronic Signature(s) Signed: 02/18/2015 2:45:34 PM By: Regan Lemming BSN, RN Entered By: Regan Lemming on 02/18/2015 10:53:55

## 2015-02-25 ENCOUNTER — Ambulatory Visit: Payer: Managed Care, Other (non HMO) | Admitting: Surgery

## 2015-02-25 ENCOUNTER — Emergency Department
Admission: EM | Admit: 2015-02-25 | Discharge: 2015-02-25 | Disposition: A | Discharge status: Home / Self Care | Payer: Managed Care, Other (non HMO) | Attending: Emergency Medicine | Admitting: Emergency Medicine

## 2015-02-25 ENCOUNTER — Encounter: Payer: Managed Care, Other (non HMO) | Admitting: Surgery

## 2015-02-25 ENCOUNTER — Other Ambulatory Visit
Admission: RE | Admit: 2015-02-25 | Discharge: 2015-02-25 | Disposition: A | Discharge status: Home / Self Care | Payer: Managed Care, Other (non HMO) | Source: Other Acute Inpatient Hospital | Attending: Surgery | Admitting: Surgery

## 2015-02-25 ENCOUNTER — Encounter: Payer: Self-pay | Admitting: *Deleted

## 2015-02-25 DIAGNOSIS — L97219 Non-pressure chronic ulcer of right calf with unspecified severity: Secondary | ICD-10-CM | POA: Diagnosis not present

## 2015-02-25 DIAGNOSIS — Z791 Long term (current) use of non-steroidal anti-inflammatories (NSAID): Secondary | ICD-10-CM | POA: Insufficient documentation

## 2015-02-25 DIAGNOSIS — E119 Type 2 diabetes mellitus without complications: Secondary | ICD-10-CM | POA: Insufficient documentation

## 2015-02-25 DIAGNOSIS — Z7982 Long term (current) use of aspirin: Secondary | ICD-10-CM | POA: Insufficient documentation

## 2015-02-25 DIAGNOSIS — Y92410 Unspecified street and highway as the place of occurrence of the external cause: Secondary | ICD-10-CM | POA: Insufficient documentation

## 2015-02-25 DIAGNOSIS — S3992XA Unspecified injury of lower back, initial encounter: Secondary | ICD-10-CM | POA: Diagnosis not present

## 2015-02-25 DIAGNOSIS — L0889 Other specified local infections of the skin and subcutaneous tissue: Secondary | ICD-10-CM | POA: Diagnosis not present

## 2015-02-25 DIAGNOSIS — S79911A Unspecified injury of right hip, initial encounter: Secondary | ICD-10-CM | POA: Diagnosis not present

## 2015-02-25 DIAGNOSIS — Y9389 Activity, other specified: Secondary | ICD-10-CM | POA: Insufficient documentation

## 2015-02-25 DIAGNOSIS — Z792 Long term (current) use of antibiotics: Secondary | ICD-10-CM | POA: Insufficient documentation

## 2015-02-25 DIAGNOSIS — I10 Essential (primary) hypertension: Secondary | ICD-10-CM | POA: Diagnosis not present

## 2015-02-25 DIAGNOSIS — Y998 Other external cause status: Secondary | ICD-10-CM | POA: Diagnosis not present

## 2015-02-25 DIAGNOSIS — Z79899 Other long term (current) drug therapy: Secondary | ICD-10-CM | POA: Insufficient documentation

## 2015-02-25 DIAGNOSIS — W19XXXA Unspecified fall, initial encounter: Secondary | ICD-10-CM

## 2015-02-25 NOTE — ED Provider Notes (Signed)
[facility] Emergency Department Provider Note  ____________________________________________  Time seen: Approximately 10:32 AM  I have reviewed the triage vital signs and the nursing notes.   HISTORY  Chief Complaint Fall   HPI [name] is a 59 y.o. female who reports she was getting out of her pickup truck and turned around to get something andlost her balance and fell. The difficulty getting up. It was laying on the ground complaining of a lot of pain in her back and right hip. EMS brought her in back boarded. On removal from the backboard she reported that the back pain and hip pain hadn't resolved. The pain is what she usually experiences while she lays on a hard surface. She has had this before. Patient was allowed to sit up and couldn't walk by herself with her cane with no new or different pain at all. We'll discharge the patient. I should add patient did not hit her head and has no loss of consciousness  Patient's past medical history significant for the fact that she will be getting a new hip in the left hip joint. She was on her way to see Dr. Algis Downs when she fell. Patient did not say anything about any other medical problems when asked however based on her medications at present. Has hypertension and diabetes   History reviewed. No pertinent past medical history.  There are no active problems to display for this patient.   History reviewed. No pertinent past surgical history.  Current Outpatient Rx  Name  Route  Sig  Dispense  Refill  . aspirin EC 81 MG tablet   Oral   Take 81 mg by mouth daily.         . cloNIDine (CATAPRES) 0.3 MG tablet      Take 1 tablet by mouth 3  times daily   270 tablet   0   . etodolac (LODINE) 400 MG tablet      Take 1 tablet by mouth two  times daily   180 tablet   0   . furosemide (LASIX) 40 MG tablet      Take 1 tablet by mouth  daily   90 tablet   0   . levothyroxine (SYNTHROID,  LEVOTHROID) 200 MCG tablet      Take 1 tablet by mouth  daily   90 tablet   0   . metFORMIN (GLUCOPHAGE) 500 MG tablet      Take 1 tablet by mouth two  times daily   180 tablet   0   . metoprolol succinate (TOPROL-XL) 50 MG 24 hr tablet      Take 1 tablet by mouth  daily   90 tablet   0   . oxyCODONE-acetaminophen (ROXICET) 5-325 MG per tablet   Oral   Take 1 tablet by mouth every 6 (six) hours as needed.   20 tablet   0   . sulfamethoxazole-trimethoprim (BACTRIM DS) 800-160 MG per tablet   Oral   Take 1 tablet by mouth 2 (two) times daily.   20 tablet   0   . telmisartan (MICARDIS) 80 MG tablet      Take 1 tablet by mouth  daily   90 tablet   0   . traMADol (ULTRAM) 50 MG tablet   Oral   Take 50 mg by mouth every 6 (six) hours as needed for moderate pain.           Allergies Review of patient's allergies  indicates no known allergies.  Family History  Problem Relation Age of Onset  . Cancer Mother   . Diabetes Father     Social History Social History  Substance Use Topics  . Smoking status: Never Smoker   . Smokeless tobacco: None  . Alcohol Use: No    Review of Systems Constitutional: No fever/chills Eyes: No visual changes. ENT: No sore throat. Cardiovascular: Denies chest pain. Respiratory: Denies shortness of breath. Gastrointestinal: No abdominal pain.  No nausea, no vomiting.  No diarrhea.  No constipation. Genitourinary: Negative for dysuria. Musculoskeletal: See history of present illness Skin: Negative for rash. Neurological: Negative for headaches, focal weakness or numbness.  10-point ROS otherwise negative.  ____________________________________________   PHYSICAL EXAM:  VITAL SIGNS: ED Triage Vitals  Enc Vitals Group     BP 02/25/15 0840 142/77 mmHg     Pulse Rate 02/25/15 0837 76     Resp 02/25/15 0837 20     Temp 02/25/15 0837 98.2 F (36.8 C)     Temp Source 02/25/15 0837 Oral     SpO2 02/25/15 0835 100 %      Weight 02/25/15 0837 250 lb (113.399 kg)     Height 02/25/15 0837 5\' 5"  (1.651 m)     Head Cir --      Peak Flow --      Pain Score 02/25/15 0838 5     Pain Loc --      Pain Edu? --      Excl. in [place]? --     Constitutional: Alert and oriented. Well appearing and in no acute distress. Eyes: Conjunctivae are normal. PERRL. EOMI. Head: Atraumatic. Nose: No congestion/rhinnorhea. Mouth/Throat: Mucous membranes are moist.  Oropharynx non-erythematous. Neck: No stridor.   Cardiovascular: Normal rate, regular rhythm. Grossly normal heart sounds.  Good peripheral circulation. Respiratory: Normal respiratory effort.  No retractions. Lungs CTAB. Gastrointestinal: Soft and nontender. No distention. No abdominal bruits. No CVA tenderness. Musculoskeletal: No lower extremity tenderness nor edema.  No joint effusions. Neurologic:  Normal speech and language. No gross focal neurologic deficits are appreciated. No gait instability. Skin:  Skin is warm, dry and intact. No rash noted. Psychiatric: Mood and affect are normal. Speech and behavior are normal.  ____________________________________________   LABS (all labs ordered are listed, but only abnormal results are displayed)  Labs Reviewed - No data to display ____________________________________________  EKG   ____________________________________________  RADIOLOGY   ____________________________________________   PROCEDURES  ____________________________________________   INITIAL IMPRESSION / ASSESSMENT AND PLAN / ED COURSE  Pertinent labs & imaging results that were available during my care of the patient were reviewed by me and considered in my medical decision making (see chart for details).   ____________________________________________   FINAL CLINICAL IMPRESSION(S) / ED DIAGNOSES  Final diagnoses:  Fall, initial encounter      Nena Polio, MD 02/25/15 1037

## 2015-02-25 NOTE — ED Notes (Signed)
Ambulated pt in room, pt states ambulatory at her baseline with no pain, pt uses cane, MD notified

## 2015-02-25 NOTE — ED Notes (Signed)
Pt arrives via EMS on spinal board, pt was walking into wound center and lost her footing, denies LOC, complaining of lower back pain and right hip pain, MD at bedside, pt removed form spinal board

## 2015-02-25 NOTE — Discharge Instructions (Signed)

## 2015-02-26 NOTE — Progress Notes (Signed)
[name], [name] ([id number]) Visit Report for 02/25/2015 Arrival Information Details Patient Name: [name], [name]. Date of Service: 02/25/2015 11:00 AM Medical Record Number: [id number] Patient Account Number: [id number] Date of Birth/Sex: 1955/12/01 (59 y.o. Female) Treating RN: Cornell Barman Primary Care Physician: [name] Other Clinician: Referring Physician: [name] Treating Physician/Extender: BURNS III, Charlean Sanfilippo in Treatment: 100 Visit Information History Since Last Visit Added or deleted any medications: No Patient Arrived: Wheel Chair Any new allergies or adverse reactions: No Arrival Time: 11:11 Had a fall or experienced change in No Accompanied By: self activities of daily living that may affect Transfer Assistance: None risk of falls: Patient Identification Verified: Yes Signs or symptoms of abuse/neglect since last No Secondary Verification Process Yes visito Completed: Hospitalized since last visit: No Patient Requires Transmission- No Pain Present Now: No Based Precautions: Patient Has Alerts: Yes Patient Alerts: Patient on Blood Thinner Electronic Signature(s) Signed: 02/25/2015 5:43:22 PM By: Gretta Cool, RN, BSN, Kim RN, BSN Entered By: Gretta Cool, RN, BSN, Kim on 02/25/2015 11:12:17 [name] ([id number]) -------------------------------------------------------------------------------- Encounter Discharge Information Details Patient Name: [name]. Date of Service: 02/25/2015 11:00 AM Medical Record Number: [id number] Patient Account Number: [id number] Date of Birth/Sex: Feb 02, 1956 (59 y.o. Female) Treating RN: Cornell Barman Primary Care Physician: [name] Other Clinician: Referring Physician: [name] Treating Physician/Extender: BURNS III, Charlean Sanfilippo in Treatment: 100 Encounter Discharge Information Items Discharge Pain Level: 0 Discharge Condition: Stable Ambulatory Status: Wheelchair Discharge Destination:  Home Transportation: Private Auto Accompanied By: self Schedule Follow-up Appointment: Yes Medication Reconciliation completed and provided to Patient/Care Yes Maysie Parkhill: Provided on Clinical Summary of Care: 02/25/2015 Form Type Recipient Paper Patient GI Electronic Signature(s) Signed: 02/25/2015 11:50:11 AM By: Ruthine Dose Entered By: Ruthine Dose on 02/25/2015 11:50:11 [name] ([id number]) -------------------------------------------------------------------------------- Lower Extremity Assessment Details Patient Name: [name] Date of Service: 02/25/2015 11:00 AM Medical Record Number: [id number] Patient Account Number: [id number] Date of Birth/Sex: December 08, 1955 (59 y.o. Female) Treating RN: Cornell Barman Primary Care Physician: [name] Other Clinician: Referring Physician: [name] Treating Physician/Extender: BURNS III, Charlean Sanfilippo in Treatment: 100 Edema Assessment Assessed: [Left: No] [Right: No] E[Left: dema] [Right: :] Calf Left: Right: Point of Measurement: 24 cm From Medial Instep cm 47.6 cm Ankle Left: Right: Point of Measurement: 8 cm From Medial Instep cm 24.2 cm Vascular Assessment Pulses: Posterior Tibial Dorsalis Pedis Palpable: [Right:Yes] Extremity colors, hair growth, and conditions: Extremity Color: [Right:Hyperpigmented] Hair Growth on Extremity: [Right:No] Temperature of Extremity: [Right:Warm] Capillary Refill: [Right:< 3 seconds] Toe Nail Assessment Left: Right: Thick: Yes Discolored: Yes Deformed: Yes Improper Length and Hygiene: No Electronic Signature(s) Signed: 02/25/2015 5:43:22 PM By: Gretta Cool, RN, BSN, Kim RN, BSN Entered By: Gretta Cool, RN, BSN, Kim on 02/25/2015 11:17:56 [name] ([id number]) -------------------------------------------------------------------------------- Multi Wound Chart Details Patient Name: [name]. Date of Service: 02/25/2015 11:00 AM Medical Record Number:  [id number] Patient Account Number: [id number] Date of Birth/Sex: [birth year] (59 y.o. Female) Treating RN: Cornell Barman Primary Care Physician: [name] Other Clinician: Referring Physician: [name] Treating Physician/Extender: BURNS III, Charlean Sanfilippo in Treatment: 100 Vital Signs Height(in): 65 Pulse(bpm): 59 Weight(lbs): Blood Pressure 183/57 (mmHg): Body Mass Index(BMI): Temperature(F): 97.7 Respiratory Rate 18 (breaths/min): Photos: [53:No Photos] [N/A:N/A] Wound Location: [53:Right Lower Leg - Circumfernential] [N/A:N/A] Wounding Event: [53:Gradually Appeared] [N/A:N/A] Primary Etiology: [53:Lymphedema] [N/A:N/A] Comorbid History: [53:Lymphedema, Hypertension, Type II Diabetes] [N/A:N/A] Date Acquired: [53:02/21/2015] [N/A:N/A] Weeks of Treatment: [53:0] [N/A:N/A] Wound Status: [53:Open] [N/A:N/A] Clustered Wound: [53:Yes] [N/A:N/A] Measurements L x W x  D 13x35x0.1 [N/A:N/A] (cm) Area (cm) : [id number] [N/A:N/A] Volume (cm) : [53:35.736] [N/A:N/A] % Reduction in Area: [53:0.00%] [N/A:N/A] % Reduction in Volume: 0.00% [N/A:N/A] Classification: [53:Partial Thickness] [N/A:N/A] HBO Classification: [53:Grade 1] [N/A:N/A] Exudate Amount: [53:Large] [N/A:N/A] Exudate Type: [53:Serous] [N/A:N/A] Exudate Color: [53:amber] [N/A:N/A] Wound Margin: [53:Indistinct, nonvisible] [N/A:N/A] Granulation Amount: [53:None Present (0%)] [N/A:N/A] Necrotic Amount: [53:None Present (0%)] [N/A:N/A] Exposed Structures: [53:Fascia: No Fat: No Tendon: No Muscle: No] [N/A:N/A] Joint: No Bone: No Limited to Skin Breakdown Epithelialization: None N/A N/A Periwound Skin Texture: Scarring: Yes N/A N/A Edema: No Excoriation: No Induration: No Callus: No Crepitus: No Fluctuance: No Friable: No Rash: No Periwound Skin Moist: Yes N/A N/A Moisture: Maceration: No Dry/Scaly: No Periwound Skin Color: Hemosiderin Staining: Yes N/A N/A Atrophie Blanche: No Cyanosis:  No Ecchymosis: No Erythema: No Mottled: No Pallor: No Rubor: No Tenderness on No N/A N/A Palpation: Treatment Notes Electronic Signature(s) Signed: 02/25/2015 5:43:22 PM By: Gretta Cool, RN, BSN, Kim RN, BSN Entered By: Gretta Cool, RN, BSN, Kim on 02/25/2015 11:32:55 [name] ([id number]) -------------------------------------------------------------------------------- Multi-Disciplinary Care Plan Details Patient Name: [name]. Date of Service: 02/25/2015 11:00 AM Medical Record Number: [id number] Patient Account Number: [id number] Date of Birth/Sex: September 04, 1955 (59 y.o. Female) Treating RN: Cornell Barman Primary Care Physician: [name] Other Clinician: Referring Physician: [name] Treating Physician/Extender: BURNS III, Charlean Sanfilippo in Treatment: 100 Active Inactive Nutrition Nursing Diagnoses: Impaired glucose control: actual or potential Goals: Patient/caregiver verbalizes understanding of need to maintain therapeutic glucose control per primary care physician Date Initiated: 12/16/2013 Goal Status: Active Interventions: Provide education on elevated blood sugars and impact on wound healing Notes: Soft Tissue Infection Nursing Diagnoses: Impaired tissue integrity Goals: Patient will remain free of wound infection Date Initiated: 12/16/2013 Goal Status: Active Interventions: Assess signs and symptoms of infection every visit Notes: Venous Leg Ulcer Nursing Diagnoses: Knowledge deficit related to disease process and management Potential for venous Insuffiency (use before diagnosis confirmed) GoalsTANEASHA, [name] ([id number]) Patient will maintain optimal edema control Date Initiated: 08/18/2014 Goal Status: Active Interventions: Assess peripheral edema status every visit. Compression as ordered Treatment Activities: Therapeutic compression applied : 02/25/2015 Notes: Electronic Signature(s) Signed: 02/25/2015 5:43:22 PM By: Gretta Cool, RN, BSN,  Kim RN, BSN Entered By: Gretta Cool, RN, BSN, Kim on 02/25/2015 11:32:47 [name] ([id number]) -------------------------------------------------------------------------------- Pain Assessment Details Patient Name: [name]. Date of Service: 02/25/2015 11:00 AM Medical Record Number: [id number] Patient Account Number: [id number] Date of Birth/Sex: May 10, 1956 (59 y.o. Female) Treating RN: Cornell Barman Primary Care Physician: [name] Other Clinician: Referring Physician: [name] Treating Physician/Extender: BURNS III, Charlean Sanfilippo in Treatment: 100 Active Problems Location of Pain Severity and Description of Pain Patient Has Paino No Site Locations Pain Management and Medication Current Pain Management: Notes knee and hip pain Electronic Signature(s) Signed: 02/25/2015 5:43:22 PM By: Gretta Cool, RN, BSN, Kim RN, BSN Entered By: Gretta Cool, RN, BSN, Kim on 02/25/2015 11:12:48 [name] ([id number]) -------------------------------------------------------------------------------- Patient/Caregiver Education Details Patient Name: [name] Date of Service: 02/25/2015 11:00 AM Medical Record Number: [id number] Patient Account Number: [id number] Date of Birth/Gender: 06/07/1956 (59 y.o. Female) Treating RN: Cornell Barman Primary Care Physician: [name] Other Clinician: Referring Physician: [name] Treating Physician/Extender: BURNS III, Charlean Sanfilippo in Treatment: 100 Education Assessment Education Provided To: Patient Education Topics Provided Wound/Skin Impairment: Handouts: Caring for Your Ulcer, Other: wound care as prescribed Methods: Demonstration Responses: State content correctly Electronic Signature(s) Signed: 02/25/2015 5:43:22 PM By: Gretta Cool, RN, BSN, Kim RN, BSN Entered By: Gretta Cool, RN,  BSN, Kim on 02/25/2015 11:43:40 [name], [name] ([id number]) -------------------------------------------------------------------------------- Wound  Assessment Details Patient Name: [name], [name]. Date of Service: 02/25/2015 11:00 AM Medical Record Number: [id number] Patient Account Number: [id number] Date of Birth/Sex: 08-18-1955 (59 y.o. Female) Treating RN: Cornell Barman Primary Care Physician: [name] Other Clinician: Referring Physician: [name] Treating Physician/Extender: BURNS III, WALTER Weeks in Treatment: 100 Wound Status Wound Number: [id number] Primary Lymphedema Etiology: Wound Location: Right Lower Leg - Circumfernential Wound Status: Open Wounding Event: Gradually Appeared Comorbid Lymphedema, Hypertension, Type II History: Diabetes Date Acquired: 02/21/2015 Weeks Of Treatment: 0 Clustered Wound: Yes Photos Photo Uploaded By: Gretta Cool, RN, BSN, Kim on 02/25/2015 12:29:58 Wound Measurements Length: (cm) 13 Width: (cm) 35 Depth: (cm) 0.1 Area: (cm) 357.356 Volume: (cm) 35.736 % Reduction in Area: 0% % Reduction in Volume: 0% Epithelialization: None Wound Description Classification: Partial Thickness Diabetic Severity [name]): Grade 1 Wound Margin: Indistinct, nonvisible Exudate Amount: Large Exudate Type: Serous Exudate Color: amber Wound Bed Granulation Amount: None Present (0%) Exposed Structure Necrotic Amount: None Present (0%) Fascia Exposed: No [name], [name] ([id number]) Fat Layer Exposed: No Tendon Exposed: No Muscle Exposed: No Joint Exposed: No Bone Exposed: No Limited to Skin Breakdown Periwound Skin Texture Texture Color No Abnormalities Noted: No No Abnormalities Noted: No Callus: No Atrophie Blanche: No Crepitus: No Cyanosis: No Excoriation: No Ecchymosis: No Fluctuance: No Erythema: No Friable: No Hemosiderin Staining: Yes Induration: No Mottled: No Localized Edema: No Pallor: No Rash: No Rubor: No Scarring: Yes Moisture No Abnormalities Noted: No Dry / Scaly: No Maceration: No Moist: Yes Treatment Notes Wound #53 (Right, Circumferential Lower  Leg) 1. Cleansed with: Clean wound with Normal Saline 2. Anesthetic Topical Lidocaine 4% cream to wound bed prior to debridement 3. Peri-wound Care: Barrier cream 4. Dressing Applied: Iodoflex 5. Secondary Dressing Applied ABD Pad 7. Secured with 4-Layer Compression System - Right Lower Extremity Notes Return Friday for re-wrap Electronic Signature(s) Signed: 02/25/2015 5:43:22 PM By: Gretta Cool, RN, BSN, Kim RN, BSN Entered By: Gretta Cool, RN, BSN, Kim on 02/25/2015 11:24:12 [name] ([name] ([id number]) -------------------------------------------------------------------------------- Vitals Details Patient Name: [name] Date of Service: 02/25/2015 11:00 AM Medical Record Number: [id number] Patient Account Number: [id number] Date of Birth/Sex: 1955/07/26 (59 y.o. Female) Treating RN: Cornell Barman Primary Care Physician: [name] Other Clinician: Referring Physician: [name] Treating Physician/Extender: BURNS III, Charlean Sanfilippo in Treatment: 100 Vital Signs Time Taken: 11:12 Temperature (F): 97.7 Height (in): 65 Pulse (bpm): 59 Source: Stated Respiratory Rate (breaths/min): 18 Blood Pressure (mmHg): 183/57 Reference Range: 80 - 120 mg / dl Electronic Signature(s) Signed: 02/25/2015 5:43:22 PM By: Gretta Cool, RN, BSN, Kim RN, BSN Entered By: Gretta Cool, RN, BSN, Kim on 02/25/2015 11:13:48

## 2015-02-26 NOTE — Progress Notes (Signed)
Autumn, [name] ([id number]) Visit Report for 02/25/2015 Chief Complaint Document Details Patient Name: Autumn, [name]. Date of Service: 02/25/2015 11:00 AM Medical Record Number: [id number] Patient Account Number: [id number] Date of Birth/Sex: [birth year] (59 y.o. 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 Electronic Signature(s) Signed: 02/25/2015 3:59:25 PM By: Loletha Grayer MD Entered By: Loletha Grayer on 02/25/2015 14:28:32 [name] ([id number]) -------------------------------------------------------------------------------- Debridement Details Patient Name: [name] Date of Service: 02/25/2015 11:00 AM Medical Record Number: [id number] Patient Account Number: [id number] Date of Birth/Sex: Jan 30, 1956 (59 y.o. Female) Treating RN: Primary Care Physician: [name] Other Clinician: Referring Physician: [name] Treating Physician/Extender: BURNS III, Charlean Sanfilippo in Treatment: 100 Debridement Performed for Wound #53 Right,Circumferential Lower Leg Assessment: Performed By: Physician BURNS III, Teressa Senter., MD Debridement: Debridement Pre-procedure Yes Verification/Time Out Taken: Start Time: 11:00 Pain Control: Lidocaine 4% Topical Solution Level: Skin/Subcutaneous Tissue Total Area Debrided (L x 1 (cm) x 1 (cm) = 1 (cm) W): Tissue and other Viable, Non-Viable, Fat, Fibrin/Slough, Subcutaneous material debrided: Instrument: Curette Bleeding: Minimum Hemostasis Achieved: Pressure End Time: 11:01 Procedural Pain: 0 Post Procedural Pain: 0 Response to Treatment: Procedure was tolerated well Post Debridement Measurements of Total Wound Length: (cm) 13 Width:  (cm) 35 Depth: (cm) 0.2 Volume: (cm) 71.471 Post Procedure Diagnosis Same as Pre-procedure Notes Biopsy culture obtained. Electronic Signature(s) Signed: 02/25/2015 3:59:25 PM By: Loletha Grayer MD Entered By: Loletha Grayer on 02/25/2015 14:35:17 [name] ([id number]) -------------------------------------------------------------------------------- HPI Details Patient Name: [name] Date of Service: 02/25/2015 11:00 AM Medical Record Number: [id number] Patient Account Number: [id number] Date of Birth/Sex: 01-29-1956 (59 y.o. Female) Treating RN: Primary Care Physician: [name] Other Clinician: Referring Physician: [name] Treating Physician/Extender: BURNS III, WALTER Weeks in Treatment: 100 History of Present Illness HPI Description: 59yo w/ h/o BLE phlebolymphedema, type 2 DM, and obesity. No PVD. h/o chronic, recurrent BLE calf ulcers. Treated with 4 layer compression. Infrequently uses lymphedema pump. Bilateral lower extremity ulcerations healed as of June 2016. Fitted for custom compression stockings but not yet received. Awaiting lymphedema consult. She was transitioned to juxtalite compression garments earlier this month. She returns to clinic today and says that she developed recurrent right calf ulcerations, swelling, and redness this past week. Her juxtalite compression garment got wet and she removed it. She reports mild pain. No fever or chills. Moderate clear drainage. Electronic Signature(s) Signed: 02/25/2015 3:59:25 PM By: Loletha Grayer MD Entered By: Loletha Grayer on 02/25/2015 14:30:37 [name] ([id number]) -------------------------------------------------------------------------------- Physical Exam Details Patient Name: [name] Date of Service: 02/25/2015 11:00 AM Medical Record Number: [id number] Patient Account Number: [id number] Date of Birth/Sex: 1956-01-27 (59 y.o. Female) Treating  RN: Primary Care Physician: [name] Other Clinician: Referring Physician: [name] Treating Physician/Extender: BURNS III, WALTER Weeks in Treatment: 100 Constitutional . Pulse regular. Respirations normal and unlabored. Afebrile. Marland Kitchen Respiratory WNL. No retractions.. Cardiovascular Pedal Pulses WNL. Integumentary (Hair, Skin) .Marland Kitchen Neurological . Psychiatric . Oriented times 3.. No evidence of depression, anxiety, or agitation.. Notes Multiple right calf ulcerations. Partial and full-thickness. Associated cellulitis in the proximal calf. 3+ pitting edema. Palpable DP. No left calf ulcerations. Edema well controlled. Electronic Signature(s) Signed: 02/25/2015 3:59:25 PM By: Loletha Grayer MD Entered By: Loletha Grayer on 02/25/2015 14:32:00 [name], Autumn  Raliegh [name] ([id number]) -------------------------------------------------------------------------------- Physician Orders Details Patient Name: [name]. Date of Service: 02/25/2015 11:00 AM Medical Record Number: [id number] Patient Account Number: [id number] Date of Birth/Sex: [birth year] (59 y.o. Female) Treating RN: Cornell Barman Primary Care Physician: [name] Other Clinician: Referring Physician: [name] Treating Physician/Extender: BURNS III, Charlean Sanfilippo in Treatment: 100 Verbal / Phone Orders: Yes Clinician: Cornell Barman Read Back and Verified: Yes Diagnosis Coding Wound Cleansing Wound #53 Right,Circumferential Lower Leg o Cleanse wound with mild soap and water Skin Barriers/Peri-Wound Care Wound #53 Right,Circumferential Lower Leg o Barrier cream Primary Wound Dressing Wound #53 Right,Circumferential Lower Leg o Iodoflex Secondary Dressing Wound #53 Right,Circumferential Lower Leg o ABD pad Dressing Change Frequency Wound #53 Right,Circumferential Lower Leg o Change dressing every week o Other: - Nurse visit Friday Follow-up Appointments Wound #53 Right,Circumferential  Lower Leg o Return Appointment in 1 week. Edema Control Wound #53 Right,Circumferential Lower Leg o 4-Layer Compression System - Right Lower Extremity Additional Orders / Instructions Wound #53 Right,Circumferential Lower Leg o Increase protein intake. Autumn, [name] ([id number]) Electronic Signature(s) Signed: 02/25/2015 3:59:25 PM By: Loletha Grayer MD Signed: 02/25/2015 5:43:22 PM By: Gretta Cool RN, BSN, Kim RN, BSN Entered By: Gretta Cool, RN, BSN, Kim on 02/25/2015 11:36:08 [name] ([id number]) -------------------------------------------------------------------------------- Problem List Details Patient Name: [name]. Date of Service: 02/25/2015 11:00 AM Medical Record Number: [id number] Patient Account Number: [id number] Date of Birth/Sex: 04-14-1956 (59 y.o. Female) Treating RN: Primary Care Physician: [name] Other Clinician: Referring Physician: [name] Treating Physician/Extender: BURNS III, Charlean Sanfilippo in Treatment: 100 Active Problems ICD-9 Encounter Code Description Active Date Diagnosis 457.1 Other Lymphedema - excludes congenital, eyelid or vulva 03/27/2013 Yes 459.33 Chronic venous hypertension with ulcer and inflammation 03/27/2013 Yes 707.12 Ulcer of lower limbs, except pressure ulcer; Ulcer of calf 03/27/2013 Yes ICD-10 Encounter Code Description Active Date Diagnosis I89.0 Lymphedema, not elsewhere classified 03/26/2014 Yes E66.9 Obesity, unspecified 03/26/2014 Yes E11.40 Type 2 diabetes mellitus with diabetic neuropathy, 01/14/2015 Yes unspecified R26.89 Other abnormalities of gait and mobility 01/14/2015 Yes L03.115 Cellulitis of right lower limb 02/25/2015 Yes 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 ([id number]) E11.622 Type 2 diabetes mellitus with other skin ulcer 02/25/2015 Yes Inactive Problems Resolved Problems ICD-10 Code Description  Active Date Resolved Date 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 08/27/2014 08/28/2014 E11.621 Type 2 diabetes mellitus with foot ulcer 10/08/2014 10/09/2014 [id number] Varicose veins of left lower extremity with both ulcer of 11/12/2014 11/12/2014 calf and inflammation L84 Corns and callosities 11/12/2014 11/12/2014 Electronic Signature(s) Signed: 02/25/2015 3:59:25 PM By: Loletha Grayer MD Entered By: Loletha Grayer on 02/25/2015 14:35:01 [name] ([id number]) -------------------------------------------------------------------------------- Progress Note Details Patient Name: [name]. Date of Service: 02/25/2015 11:00 AM Medical Record Number: [id number] Patient Account Number: [id number] Date of Birth/Sex: 12-29-1955 (59 y.o. 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 History of Present Illness (HPI) 59yo w/ h/o BLE phlebolymphedema, type 2 DM, and obesity. No PVD. h/o chronic, recurrent BLE calf ulcers. Treated with 4 layer compression. Infrequently uses lymphedema pump. Bilateral lower extremity ulcerations healed as of June 2016. Fitted for custom compression stockings but not yet received. Awaiting lymphedema consult. She was transitioned to juxtalite compression garments earlier this  month. She returns to clinic today and says that she developed recurrent right calf ulcerations, swelling, and redness this past week. Her juxtalite compression garment got wet and she removed it. She reports mild pain. No fever or chills. Moderate clear drainage. Objective Constitutional Pulse regular. Respirations normal and unlabored. Afebrile. Vitals Time Taken: 11:12 AM,  Height: 65 in, Source: Stated, Temperature: 97.7 F, Pulse: 59 bpm, Respiratory Rate: 18 breaths/min, Blood Pressure: 183/57 mmHg. Respiratory WNL. No retractions.. Cardiovascular Pedal Pulses WNL. Psychiatric [name], [name] ([id number]) Oriented times 3.. No evidence of depression, anxiety, or agitation.. General Notes: Multiple right calf ulcerations. Partial and full-thickness. Associated cellulitis in the proximal calf. 3+ pitting edema. Palpable DP. No left calf ulcerations. Edema well controlled. Integumentary (Hair, Skin) Wound #53 status is Open. Original cause of wound was Gradually Appeared. The wound is located on the Right,Circumferential Lower Leg. The wound measures 13cm length x 35cm width x 0.1cm depth; 357.356cm^2 area and 35.736cm^3 volume. The wound is limited to skin breakdown. There is a large amount of serous drainage noted. The wound margin is indistinct and nonvisible. There is no granulation within the wound bed. There is no necrotic tissue within the wound bed. The periwound skin appearance exhibited: Scarring, Moist, Hemosiderin Staining. The periwound skin appearance did not exhibit: Callus, Crepitus, Excoriation, Fluctuance, Friable, Induration, Localized Edema, Rash, Dry/Scaly, Maceration, Atrophie Blanche, Cyanosis, Ecchymosis, Mottled, Pallor, Rubor, Erythema. 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 Procedures Wound #53 Wound #53 is a Lymphedema located on the Right,Circumferential Lower Leg . There was a [name], [name]. ([id number]) Skin/Subcutaneous Tissue Debridement [id number]) debridement with total area of 1 sq cm performed by BURNS III, Teressa Senter., MD. with the following instrument(s): Curette to remove Viable and Non-Viable tissue/material including Fat, Fibrin/Slough, and Subcutaneous after achieving pain control using Lidocaine 4% Topical Solution. A time out was conducted prior to the start of the procedure. A Minimum amount of bleeding was controlled with Pressure. The procedure was tolerated well with a pain level of 0 throughout and a pain level of 0 following the procedure. Post Debridement Measurements: 13cm length x 35cm width x 0.2cm depth; 71.471cm^3 volume. Post procedure Diagnosis Wound #53: Same as Pre-Procedure General Notes: Biopsy culture obtained.. Plan Wound Cleansing: Wound #53 Right,Circumferential Lower Leg: Cleanse wound with mild soap and water Skin Barriers/Peri-Wound Care: Wound #53 Right,Circumferential Lower Leg: Barrier cream Primary Wound Dressing: Wound #53 Right,Circumferential Lower Leg: Iodoflex Secondary Dressing: Wound #53 Right,Circumferential Lower Leg: ABD pad Dressing Change Frequency: Wound #53 Right,Circumferential Lower Leg: Change dressing every week Other: - Nurse visit Friday Follow-up Appointments: Wound #53 Right,Circumferential Lower Leg: Return Appointment in 1 week. Edema Control: Wound #53 Right,Circumferential Lower Leg: 4-Layer Compression System - Right Lower Extremity Additional Orders / Instructions: Wound #53 Right,Circumferential Lower Leg: Increase protein intake. Cadexomer iodine. 4-layer compression bandage to right calf. Return to clinic Friday or Monday. [name], [name] ([id number]) Juxtalite to left  calf. Doxycycline  started. Follow-up on biopsy cultures obtained today. Frequent leg elevation. Use lymphedema pump 2 hours daily. Electronic Signature(s) Signed: 02/25/2015 3:59:25 PM By: Loletha Grayer MD Entered By: Loletha Grayer on 02/25/2015 14:35:32 [name] ([id number]) -------------------------------------------------------------------------------- SuperBill Details Patient Name: [name] Date of Service: 02/25/2015 Medical Record Number: [id number] Patient Account Number: [id number] Date of Birth/Sex: [birth year] (59 y.o. 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 E11.622 Type 2 diabetes mellitus with other skin ulcer Facility Procedures CPT4: Description Modifier Quantity Code [id number] (Facility Use Only) [phone] - APPLY MULTLAY COMPRS LWR RT 1 LEG CPT4: [id number] - DEB SUBQ TISSUE 20 SQ CM/< 1 ICD-10 Description Diagnosis 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

## 2015-02-27 ENCOUNTER — Encounter: Payer: Managed Care, Other (non HMO) | Attending: Surgery

## 2015-02-27 DIAGNOSIS — I890 Lymphedema, not elsewhere classified: Secondary | ICD-10-CM | POA: Insufficient documentation

## 2015-02-27 DIAGNOSIS — R2689 Other abnormalities of gait and mobility: Secondary | ICD-10-CM | POA: Insufficient documentation

## 2015-02-27 DIAGNOSIS — L97219 Non-pressure chronic ulcer of right calf with unspecified severity: Secondary | ICD-10-CM | POA: Insufficient documentation

## 2015-02-27 DIAGNOSIS — E1140 Type 2 diabetes mellitus with diabetic neuropathy, unspecified: Secondary | ICD-10-CM | POA: Diagnosis not present

## 2015-02-27 DIAGNOSIS — I83012 Varicose veins of right lower extremity with ulcer of calf: Secondary | ICD-10-CM | POA: Insufficient documentation

## 2015-02-27 DIAGNOSIS — E669 Obesity, unspecified: Secondary | ICD-10-CM | POA: Diagnosis not present

## 2015-02-27 NOTE — Progress Notes (Signed)
ANIELLE, STARLING ([id number]) Visit Report for 02/27/2015 Arrival Information Details Patient Name: [name], [name]. Date of Service: 02/27/2015 1:45 PM Medical Record Number: [id number] Patient Account Number: [id number] Date of Birth/Sex: [birth year] (59 y.o. Female) Treating RN: Cornell Barman Primary Care Physician: [name] Other Clinician: Referring Physician: [name] Treating Physician/Extender: Frann Rider in Treatment: 100 Visit Information History Since Last Visit Added or deleted any medications: No Patient Arrived: Wheel Chair Any new allergies or adverse reactions: No Arrival Time: 14:28 Had a fall or experienced change in No Accompanied By: self activities of daily living that may affect Transfer Assistance: None risk of falls: Patient Identification Verified: Yes Signs or symptoms of abuse/neglect since last No Secondary Verification Process Yes visito Completed: Hospitalized since last visit: No Patient Requires Transmission- No Has Dressing in Place as Prescribed: Yes Based Precautions: Pain Present Now: No Patient Has Alerts: Yes Patient Alerts: Patient on Blood Thinner Electronic Signature(s) Signed: 02/27/2015 2:56:30 PM By: Gretta Cool, RN, BSN, Kim RN, BSN Entered By: Gretta Cool, RN, BSN, Kim on 02/27/2015 14:28:41 [name] ([id number]) -------------------------------------------------------------------------------- Encounter Discharge Information Details Patient Name: [name]. Date of Service: 02/27/2015 1:45 PM Medical Record Number: [id number] Patient Account Number: [id number] Date of Birth/Sex: [birth year] (59 y.o. Female) Treating RN: Cornell Barman Primary Care Physician: [name] Other Clinician: Referring Physician: [name] Treating Physician/Extender: Frann Rider in Treatment: 100 Encounter Discharge Information Items Discharge Pain Level: 0 Discharge Condition: Stable Ambulatory Status:  Wheelchair Discharge Destination: Home Private Transportation: Auto Accompanied By: self Schedule Follow-up Appointment: Yes Medication Reconciliation completed and Yes provided to Patient/Care [name]: Clinical Summary of Care: Electronic Signature(s) Signed: 02/27/2015 2:56:30 PM By: Gretta Cool, RN, BSN, Kim RN, BSN Entered By: Gretta Cool, RN, BSN, Kim on 02/27/2015 14:29:59 [name] ([id number]) -------------------------------------------------------------------------------- Patient/Caregiver Education Details Patient Name: [name] Date of Service: 02/27/2015 1:45 PM Medical Record Number: [id number] Patient Account Number: [id number] Date of Birth/Gender: 1956-03-21 (59 y.o. Female) Treating RN: Cornell Barman Primary Care Physician: [name] Other Clinician: Referring Physician: [name] Treating Physician/Extender: Frann Rider in Treatment: 100 Education Assessment Education Provided To: Patient Education Topics Provided Wound/Skin Impairment: Handouts: Caring for Your Ulcer, Other: continue wound care as prescribed Methods: Demonstration, Explain/Verbal Responses: State content correctly Electronic Signature(s) Signed: 02/27/2015 2:56:30 PM By: Gretta Cool, RN, BSN, Kim RN, BSN Entered By: Gretta Cool, RN, BSN, Kim on 02/27/2015 14:29:32

## 2015-02-28 LAB — WOUND CULTURE

## 2015-03-06 ENCOUNTER — Ambulatory Visit: Payer: Managed Care, Other (non HMO) | Admitting: Surgery

## 2015-03-11 ENCOUNTER — Ambulatory Visit: Payer: Managed Care, Other (non HMO) | Admitting: Surgery

## 2015-03-16 ENCOUNTER — Ambulatory Visit: Payer: Managed Care, Other (non HMO) | Admitting: Surgery

## 2015-03-26 ENCOUNTER — Encounter
Admission: RE | Admit: 2015-03-26 | Discharge: 2015-03-26 | Disposition: A | Discharge status: Home / Self Care | Payer: Managed Care, Other (non HMO) | Source: Ambulatory Visit | Attending: Orthopedic Surgery | Admitting: Orthopedic Surgery

## 2015-03-26 DIAGNOSIS — I10 Essential (primary) hypertension: Secondary | ICD-10-CM | POA: Diagnosis not present

## 2015-03-26 DIAGNOSIS — Z01818 Encounter for other preprocedural examination: Secondary | ICD-10-CM | POA: Diagnosis not present

## 2015-03-26 HISTORY — DX: Essential (primary) hypertension: I10

## 2015-03-26 HISTORY — DX: Hypothyroidism, unspecified: E03.9

## 2015-03-26 HISTORY — DX: Type 2 diabetes mellitus without complications: E11.9

## 2015-03-26 HISTORY — DX: Calculus of kidney: N20.0

## 2015-03-26 LAB — TYPE AND SCREEN
ABO/RH(D): O POS
Antibody Screen: NEGATIVE

## 2015-03-26 LAB — CBC
HEMATOCRIT: 35.6 % (ref 35.0–47.0)
Hemoglobin: 11.6 g/dL — ABNORMAL LOW (ref 12.0–16.0)
MCH: 28.7 pg (ref 26.0–34.0)
MCHC: 32.7 g/dL (ref 32.0–36.0)
MCV: 87.7 fL (ref 80.0–100.0)
Platelets: 196 10*3/uL (ref 150–440)
RBC: 4.06 MIL/uL (ref 3.80–5.20)
RDW: 15.1 % — ABNORMAL HIGH (ref 11.5–14.5)
WBC: 5.7 10*3/uL (ref 3.6–11.0)

## 2015-03-26 LAB — BASIC METABOLIC PANEL
Anion gap: 4 — ABNORMAL LOW (ref 5–15)
BUN: 25 mg/dL — AB (ref 6–20)
CHLORIDE: 104 mmol/L (ref 101–111)
CO2: 26 mmol/L (ref 22–32)
CREATININE: 2.53 mg/dL — AB (ref 0.44–1.00)
Calcium: 8.9 mg/dL (ref 8.9–10.3)
GFR calc Af Amer: 22 mL/min — ABNORMAL LOW (ref >60)
GFR calc non Af Amer: 19 mL/min — ABNORMAL LOW (ref >60)
Glucose, Bld: 139 mg/dL — ABNORMAL HIGH (ref 65–99)
Potassium: 4.8 mmol/L (ref 3.5–5.1)
Sodium: 134 mmol/L — ABNORMAL LOW (ref 135–145)

## 2015-03-26 LAB — SURGICAL PCR SCREEN
MRSA, PCR: NEGATIVE
Staphylococcus aureus: NEGATIVE

## 2015-03-26 LAB — APTT: aPTT: 29 seconds (ref 24–36)

## 2015-03-26 LAB — PROTIME-INR
INR: 1.22
PROTHROMBIN TIME: 15.6 s — AB (ref 11.4–15.0)

## 2015-03-26 LAB — SEDIMENTATION RATE: Sed Rate: 44 mm/hr — ABNORMAL HIGH (ref 0–30)

## 2015-03-26 LAB — ABO/RH: ABO/RH(D): O POS

## 2015-03-26 NOTE — Patient Instructions (Addendum)
  Your procedure is scheduled on: Tuesday Oct. 11, 2016. Report to Same Day Surgery. To find out your arrival time please call [phone] between 1PM - 3PM on Monday Oct. 10,2016.  Remember: Instructions that are not followed completely may result in serious medical risk, up to and including death, or upon the discretion of your surgeon and anesthesiologist your surgery may need to be rescheduled.    __x__ 1. Do not eat food or drink liquids after midnight. No gum chewing or hard candies.     ____ 2. No Alcohol for 24 hours before or after surgery.   ____ 3. Bring all medications with you on the day of surgery if instructed.    __x__ 4. Notify your doctor if there is any change in your medical condition     (cold, fever, infections).     Do not wear jewelry, make-up, hairpins, clips or nail polish.  Do not wear lotions, powders, or perfumes. You may wear deodorant.  Do not shave 48 hours prior to surgery. Men may shave face and neck.  Do not bring valuables to the hospital.    [facility] is not responsible for any belongings or valuables.               Contacts, dentures or bridgework may not be worn into surgery.  Leave your suitcase in the car. After surgery it may be brought to your room.  For patients admitted to the hospital, discharge time is determined by your treatment team.   Patients discharged the day of surgery will not be allowed to drive home.    Please read over the following fact sheets that you were given:   [facility] Preparing for Surgery  _x___ Take these medicines the morning of surgery with A SIP OF WATER:    1. levothyroxine (SYNTHROID, LEVOTHROID)  2. metoprolol succinate (TOPROL-XL)   3. telmisartan (MICARDIS)  4. Clonidine  ____ Fleet Enema (as directed)   __x__ Use SAGE wipes as directed  ____ Use inhalers on the day of surgery  __x__ Stop metformin 2 days prior to surgery on October 9th, 2016.    ____ Take 1/2 of usual insulin dose the  night before surgery and none on the morning of surgery.   __x__ Stop aspirin on Oct. 4, 2106.  __x__ Stop Anti-inflammatories Etodolac on Oct. 4, 2016. You can take Tylenol or Tramadol for pain.   ____ Stop supplements until after surgery.    ____ Bring C-Pap to the hospital.

## 2015-03-27 ENCOUNTER — Other Ambulatory Visit: Payer: Self-pay

## 2015-03-27 DIAGNOSIS — R9431 Abnormal electrocardiogram [ECG] [EKG]: Secondary | ICD-10-CM

## 2015-03-27 NOTE — Pre-Procedure Instructions (Signed)
EKG sent to Dr. Rosey Bath for review, clearance requested due to changes in EKG.  Clearance faxed to Dr.s Rudene Christians and [name].  Spoke with Mendel Ryder Dr. Rudene Christians nurse reagarding clearance.

## 2015-03-27 NOTE — OR Nursing (Signed)
Ekg to Dr Rosey Bath for review

## 2015-03-31 NOTE — Pre-Procedure Instructions (Signed)
Spoke with pt on the phone, request pt come into PAT to give another urine sample, 1st sample was left in bathroom, did not make to lab.  Pt informed this RN that she has an appt tomorrow with Dr. Clayborn Bigness for cardiac clearance.

## 2015-04-01 ENCOUNTER — Encounter
Admission: RE | Admit: 2015-04-01 | Discharge: 2015-04-01 | Disposition: A | Discharge status: Home / Self Care | Payer: Managed Care, Other (non HMO) | Source: Ambulatory Visit | Attending: Orthopedic Surgery | Admitting: Orthopedic Surgery

## 2015-04-01 ENCOUNTER — Encounter: Payer: Managed Care, Other (non HMO) | Attending: Surgery | Admitting: Surgery

## 2015-04-01 DIAGNOSIS — Z01818 Encounter for other preprocedural examination: Secondary | ICD-10-CM | POA: Insufficient documentation

## 2015-04-01 DIAGNOSIS — E669 Obesity, unspecified: Secondary | ICD-10-CM | POA: Insufficient documentation

## 2015-04-01 DIAGNOSIS — L97221 Non-pressure chronic ulcer of left calf limited to breakdown of skin: Secondary | ICD-10-CM | POA: Insufficient documentation

## 2015-04-01 DIAGNOSIS — I890 Lymphedema, not elsewhere classified: Secondary | ICD-10-CM | POA: Diagnosis not present

## 2015-04-01 DIAGNOSIS — R2689 Other abnormalities of gait and mobility: Secondary | ICD-10-CM | POA: Diagnosis not present

## 2015-04-01 DIAGNOSIS — I83012 Varicose veins of right lower extremity with ulcer of calf: Secondary | ICD-10-CM | POA: Insufficient documentation

## 2015-04-01 DIAGNOSIS — E1140 Type 2 diabetes mellitus with diabetic neuropathy, unspecified: Secondary | ICD-10-CM | POA: Diagnosis not present

## 2015-04-01 DIAGNOSIS — I83222 Varicose veins of left lower extremity with both ulcer of calf and inflammation: Secondary | ICD-10-CM | POA: Insufficient documentation

## 2015-04-01 DIAGNOSIS — L97211 Non-pressure chronic ulcer of right calf limited to breakdown of skin: Secondary | ICD-10-CM | POA: Insufficient documentation

## 2015-04-01 LAB — URINALYSIS COMPLETE WITH MICROSCOPIC (ARMC ONLY)
Bacteria, UA: NONE SEEN
GLUCOSE, UA: NEGATIVE mg/dL
Hgb urine dipstick: NEGATIVE
KETONES UR: NEGATIVE mg/dL
Leukocytes, UA: NEGATIVE
NITRITE: NEGATIVE
PH: 6 (ref 5.0–8.0)
Protein, ur: 100 mg/dL — AB
Specific Gravity, Urine: 1.012 (ref 1.005–1.030)

## 2015-04-01 NOTE — OR Nursing (Signed)
Faxed labs to Dr Ronnald Ramp and requested be a part of medical clearance. Left message for Baxter Flattery.

## 2015-04-02 ENCOUNTER — Ambulatory Visit: Payer: Managed Care, Other (non HMO) | Admitting: Family Medicine

## 2015-04-02 ENCOUNTER — Ambulatory Visit (INDEPENDENT_AMBULATORY_CARE_PROVIDER_SITE_OTHER): Payer: Managed Care, Other (non HMO) | Admitting: Family Medicine

## 2015-04-02 ENCOUNTER — Encounter: Payer: Self-pay | Admitting: Family Medicine

## 2015-04-02 VITALS — BP 120/80 | HR 80 | Ht 65.0 in | Wt 250.0 lb

## 2015-04-02 DIAGNOSIS — E119 Type 2 diabetes mellitus without complications: Secondary | ICD-10-CM

## 2015-04-02 DIAGNOSIS — R609 Edema, unspecified: Secondary | ICD-10-CM

## 2015-04-02 DIAGNOSIS — I10 Essential (primary) hypertension: Secondary | ICD-10-CM | POA: Diagnosis not present

## 2015-04-02 DIAGNOSIS — E039 Hypothyroidism, unspecified: Secondary | ICD-10-CM

## 2015-04-02 DIAGNOSIS — N289 Disorder of kidney and ureter, unspecified: Secondary | ICD-10-CM

## 2015-04-02 DIAGNOSIS — R600 Localized edema: Secondary | ICD-10-CM

## 2015-04-02 MED ORDER — LEVOTHYROXINE SODIUM 200 MCG PO TABS: ORAL_TABLET | ORAL | Status: DC

## 2015-04-02 MED ORDER — CLONIDINE HCL 0.3 MG PO TABS: ORAL_TABLET | ORAL | Status: DC

## 2015-04-02 MED ORDER — METOPROLOL SUCCINATE ER 50 MG PO TB24: ORAL_TABLET | ORAL | Status: DC

## 2015-04-02 MED ORDER — TELMISARTAN 80 MG PO TABS: ORAL_TABLET | ORAL | Status: DC

## 2015-04-02 NOTE — Progress Notes (Addendum)
SHAKHIA, MCANELLY ([id number]) Visit Report for 04/01/2015 Arrival Information Details Patient Name: [name], [name]. Date of Service: 04/01/2015 10:30 AM Medical Record Number: [id number] Patient Account Number: [id number] Date of Birth/Sex: 05/14/1956 (59 y.o. Female) Treating RN: Montey Hora Primary Care Physician: [name] Other Clinician: Referring Physician: [name] Treating Physician/Extender: BURNS III, Charlean Sanfilippo in Treatment: 9 Visit Information History Since Last Visit Added or deleted any medications: No Patient Arrived: Wheel Chair Any new allergies or adverse reactions: No Arrival Time: 10:47 Had a fall or experienced change in No Accompanied By: self activities of daily living that may affect Transfer Assistance: None risk of falls: Patient Identification Verified: Yes Signs or symptoms of abuse/neglect since last No Secondary Verification Process Yes visito Completed: Hospitalized since last visit: No Patient Requires Transmission- No Pain Present Now: No Based Precautions: Patient Has Alerts: Yes Patient Alerts: Patient on Blood Thinner Electronic Signature(s) Signed: 04/01/2015 4:37:40 PM By: Montey Hora Entered By: Montey Hora on 04/01/2015 10:48:14 [name] ([id number]) -------------------------------------------------------------------------------- Clinic Level of Care Assessment Details Patient Name: [name] Date of Service: 04/01/2015 10:30 AM Medical Record Number: [id number] Patient Account Number: [id number] Date of Birth/Sex: 23-Jul-1955 (59 y.o. Female) Treating RN: Montey Hora Primary Care Physician: [name] Other Clinician: Referring Physician: [name] Treating Physician/Extender: BURNS III, Charlean Sanfilippo in Treatment: 105 Clinic Level of Care Assessment Items TOOL 4 Quantity Score []  - Use when only an EandM is performed on FOLLOW-UP visit 0 ASSESSMENTS - Nursing Assessment /  Reassessment X - Reassessment of Co-morbidities (includes updates in patient status) 1 10 X - Reassessment of Adherence to Treatment Plan 1 5 ASSESSMENTS - Wound and Skin Assessment / Reassessment []  - Simple Wound Assessment / Reassessment - one wound 0 X - Complex Wound Assessment / Reassessment - multiple wounds 2 5 []  - Dermatologic / Skin Assessment (not related to wound area) 0 ASSESSMENTS - Focused Assessment X - Circumferential Edema Measurements - multi extremities 2 5 []  - Nutritional Assessment / Counseling / Intervention 0 X - Lower Extremity Assessment (monofilament, tuning fork, pulses) 1 5 []  - Peripheral Arterial Disease Assessment (using hand held doppler) 0 ASSESSMENTS - Ostomy and/or Continence Assessment and Care []  - Incontinence Assessment and Management 0 []  - Ostomy Care Assessment and Management (repouching, etc.) 0 PROCESS - Coordination of Care X - Simple Patient / Family Education for ongoing care 1 15 []  - Complex (extensive) Patient / Family Education for ongoing care 0 []  - Staff obtains Programmer, systems, Records, Test Results / Process Orders 0 []  - Staff telephones HHA, Nursing Homes / Clarify orders / etc 0 []  - Routine Transfer to another Facility (non-emergent condition) 0 Jahn, Keari K. ([id number]) []  - Routine Hospital Admission (non-emergent condition) 0 []  - New Admissions / Biomedical engineer / Ordering NPWT, Apligraf, etc. 0 []  - Emergency Hospital Admission (emergent condition) 0 X - Simple Discharge Coordination 1 10 []  - Complex (extensive) Discharge Coordination 0 PROCESS - Special Needs []  - Pediatric / Minor Patient Management 0 []  - Isolation Patient Management 0 []  - Hearing / Language / Visual special needs 0 []  - Assessment of Community assistance (transportation, D/C planning, etc.) 0 []  - Additional assistance / Altered mentation 0 []  - Support Surface(s) Assessment (bed, cushion, seat, etc.) 0 INTERVENTIONS - Wound Cleansing  / Measurement []  - Simple Wound Cleansing - one wound 0 X - Complex Wound Cleansing - multiple wounds 2 5 X - Wound Imaging (photographs - any number of wounds)  1 5 []  - Wound Tracing (instead of photographs) 0 []  - Simple Wound Measurement - one wound 0 X - Complex Wound Measurement - multiple wounds 2 5 INTERVENTIONS - Wound Dressings []  - Small Wound Dressing one or multiple wounds 0 X - Medium Wound Dressing one or multiple wounds 2 15 []  - Large Wound Dressing one or multiple wounds 0 []  - Application of Medications - topical 0 []  - Application of Medications - injection 0 INTERVENTIONS - Miscellaneous []  - External ear exam 0 Whitehair, Itzamar K. ([id number]) []  - Specimen Collection (cultures, biopsies, blood, body fluids, etc.) 0 []  - Specimen(s) / Culture(s) sent or taken to Lab for analysis 0 []  - Patient Transfer (multiple staff / Harrel Lemon Lift / Similar devices) 0 []  - Simple Staple / Suture removal (25 or less) 0 []  - Complex Staple / Suture removal (26 or more) 0 []  - Hypo / Hyperglycemic Management (close monitor of Blood Glucose) 0 []  - Ankle / Brachial Index (ABI) - do not check if billed separately 0 X - Vital Signs 1 5 Has the patient been seen at the hospital within the last three years: Yes Total Score: 125 Level Of Care: New/Established - Level 4 Electronic Signature(s) Signed: 04/01/2015 4:37:40 PM By: Montey Hora Entered By: Montey Hora on 04/01/2015 11:13:52 [name] ([id number]) -------------------------------------------------------------------------------- Encounter Discharge Information Details Patient Name: [name]. Date of Service: 04/01/2015 10:30 AM Medical Record Number: [id number] Patient Account Number: [id number] Date of Birth/Sex: Apr 21, 1956 (59 y.o. Female) Treating RN: Cornell Barman Primary Care Physician: [name] Other Clinician: Referring Physician: [name] Treating Physician/Extender: BURNS III,  Charlean Sanfilippo in Treatment: 105 Encounter Discharge Information Items Discharge Pain Level: 0 Discharge Condition: Stable Ambulatory Status: Wheelchair Discharge Destination: Home Transportation: Private Auto Accompanied By: friend Schedule Follow-up Appointment: Yes Medication Reconciliation completed and provided to Patient/Care Yes Efrem Pitstick: Provided on Clinical Summary of Care: 04/01/2015 Form Type Recipient Paper Patient GI Electronic Signature(s) Signed: 04/01/2015 4:54:35 PM By: Gretta Cool RN, BSN, Kim RN, BSN Previous Signature: 04/01/2015 8:27:47 AM Version By: Ruthine Dose Entered By: Gretta Cool RN, BSN, Kim on 04/01/2015 12:02:45 [name] ([id number]) -------------------------------------------------------------------------------- Lower Extremity Assessment Details Patient Name: [name]. Date of Service: 04/01/2015 10:30 AM Medical Record Number: [id number] Patient Account Number: [id number] Date of Birth/Sex: 07-30-1955 (59 y.o. Female) Treating RN: Montey Hora Primary Care Physician: [name] Other Clinician: Referring Physician: [name] Treating Physician/Extender: BURNS III, Charlean Sanfilippo in Treatment: 105 Edema Assessment Assessed: [Left: No] [Right: No] E[Left: dema] [Right: :] Calf Left: Right: Point of Measurement: 24 cm From Medial Instep 47.1 cm cm Ankle Left: Right: Point of Measurement: 8 cm From Medial Instep 27 cm cm Vascular Assessment Pulses: Posterior Tibial Palpable: [Left:Yes] [Right:Yes] Dorsalis Pedis Palpable: [Left:Yes] [Right:Yes] Extremity colors, hair growth, and conditions: Extremity Color: [Left:Hyperpigmented] [Right:Hyperpigmented] Temperature of Extremity: [Left:Warm] [Right:Warm] Capillary Refill: [Left:< 3 seconds] [Right:< 3 seconds] Toe Nail Assessment Left: Right: Thick: Yes Yes Discolored: Yes Yes Deformed: Yes Yes Improper Length and Hygiene: No No Electronic Signature(s) Signed: 04/01/2015  4:37:40 PM By: Montey Hora Entered By: Montey Hora on 04/01/2015 10:54:43 [name] ([id number]) -------------------------------------------------------------------------------- Multi Wound Chart Details Patient Name: [name]. Date of Service: 04/01/2015 10:30 AM Medical Record Number: [id number] Patient Account Number: [id number] Date of Birth/Sex: [birth year] (59 y.o. Female) Treating RN: Montey Hora Primary Care Physician: [name] Other Clinician: Referring Physician: [name] Treating Physician/Extender: BURNS III, WALTER Weeks in Treatment: 105 Vital Signs Height(in): 65 Pulse(bpm):  53 Weight(lbs): Blood Pressure 199/67 (mmHg): Body Mass Index(BMI): Temperature(F): 97.7 Respiratory Rate 18 (breaths/min): Photos: [53:No Photos] [54:No Photos] [N/A:N/A] Wound Location: [53:Right Lower Leg - Circumfernential] [54:Left Lower Leg - Circumfernential] [N/A:N/A] Wounding Event: [53:Gradually Appeared] [54:Gradually Appeared] [N/A:N/A] Primary Etiology: [53:Lymphedema] [54:Lymphedema] [N/A:N/A] Comorbid History: [53:Lymphedema, Hypertension, Type II Diabetes] [54:Lymphedema, Hypertension, Type II Diabetes] [N/A:N/A] Date Acquired: [53:02/21/2015] [54:03/26/2015] [N/A:N/A] Weeks of Treatment: [53:5] [54:0] [N/A:N/A] Wound Status: [53:Open] [54:Open] [N/A:N/A] Clustered Wound: [53:Yes] [54:Yes] [N/A:N/A] Measurements L x W x D 12x18x0.1 [54:4x31x0.1] [N/A:N/A] (cm) Area (cm) : [id number] [54:97.389] [N/A:N/A] Volume (cm) : [id number] [54:9.739] [N/A:N/A] % Reduction in Area: [53:52.50%] [54:0.00%] [N/A:N/A] % Reduction in Volume: 52.50% [54:0.00%] [N/A:N/A] Classification: [53:Partial Thickness] [54:Partial Thickness] [N/A:N/A] HBO Classification: [53:Grade 1] [54:Grade 1] [N/A:N/A] Exudate Amount: [53:Large] [54:Small] [N/A:N/A] Exudate Type: [53:Serous] [54:Serous] [N/A:N/A] Exudate Color: [53:amber] [54:amber] [N/A:N/A] Wound  Margin: [53:Indistinct, nonvisible] [54:Indistinct, nonvisible] [N/A:N/A] Granulation Amount: [53:None Present (0%)] [54:Small (1-33%)] [N/A:N/A] Necrotic Amount: [53:None Present (0%)] [54:Small (1-33%)] [N/A:N/A] Exposed Structures: [53:Fascia: No Fat: No Tendon: No Muscle: No] [54:Fascia: No Fat: No Tendon: No Muscle: No] [N/A:N/A] Joint: No Joint: No Bone: No Bone: No Limited to Skin Limited to Skin Breakdown Breakdown Epithelialization: None N/A N/A Periwound Skin Texture: Scarring: Yes Edema: No N/A Edema: No Excoriation: No Excoriation: No Induration: No Induration: No Callus: No Callus: No Crepitus: No Crepitus: No Fluctuance: No Fluctuance: No Friable: No Friable: No Rash: No Rash: No Scarring: No Periwound Skin Moist: Yes Dry/Scaly: Yes N/A Moisture: Maceration: No Maceration: No Dry/Scaly: No Moist: No Periwound Skin Color: Hemosiderin Staining: Yes Hemosiderin Staining: Yes N/A Atrophie Blanche: No Atrophie Blanche: No Cyanosis: No Cyanosis: No Ecchymosis: No Ecchymosis: No Erythema: No Erythema: No Mottled: No Mottled: No Pallor: No Pallor: No Rubor: No Rubor: No Temperature: N/A No Abnormality N/A Tenderness on No No N/A Palpation: Wound Preparation: Ulcer Cleansing: Ulcer Cleansing: N/A Rinsed/Irrigated with Rinsed/Irrigated with Saline Saline Topical Anesthetic Topical Anesthetic Applied: None Applied: None Treatment Notes Electronic Signature(s) Signed: 04/01/2015 4:37:40 PM By: Montey Hora Entered By: Montey Hora on 04/01/2015 11:05:50 [name] ([id number]) -------------------------------------------------------------------------------- Multi-Disciplinary Care Plan Details Patient Name: [name]. Date of Service: 04/01/2015 10:30 AM Medical Record Number: [id number] Patient Account Number: [id number] Date of Birth/Sex: 1955/08/19 (59 y.o. Female) Treating RN: Montey Hora Primary Care Physician: [name] Other Clinician: Referring Physician: [name] Treating Physician/Extender: BURNS III, Charlean Sanfilippo in Treatment: 105 Active Inactive Electronic Signature(s) Signed: 04/02/2015 4:56:28 PM By: Montey Hora Signed: 04/06/2015 9:25:31 AM By: Gretta Cool RN, BSN, Kim RN, BSN Previous Signature: 04/01/2015 4:37:40 PM Version By: Montey Hora Entered By: Gretta Cool RN, BSN, Kim on 04/02/2015 16:54:08 [name] ([id number]) -------------------------------------------------------------------------------- Patient/Caregiver Education Details Patient Name: [name] Date of Service: 04/01/2015 10:30 AM Medical Record Number: [id number] Patient Account Number: [id number] Date of Birth/Gender: [birth year] (59 y.o. Female) Treating RN: Montey Hora Primary Care Physician: [name] Other Clinician: Referring Physician: [name] Treating Physician/Extender: BURNS III, Charlean Sanfilippo in Treatment: 105 Education Assessment Education Provided To: Patient Education Topics Provided Venous: Handouts: Other: compression daily and regular [place] visits Methods: Explain/Verbal Responses: State content correctly Electronic Signature(s) Signed: 04/01/2015 4:37:40 PM By: Montey Hora Entered By: Montey Hora on 04/01/2015 11:19:09 [name] ([id number]) -------------------------------------------------------------------------------- Wound Assessment Details Patient Name: [name]. Date of Service: 04/01/2015 10:30 AM Medical Record Number: [id number] Patient Account Number: [id number] Date of Birth/Sex: September 07, 1955 (59 y.o. Female) Treating RN: Montey Hora Primary Care Physician: [name] Other Clinician: Referring Physician: [name]  Treating Physician/Extender: BURNS III, WALTER Weeks in Treatment: 105 Wound Status Wound Number: [id number] Primary Lymphedema Etiology: Wound Location: Right Lower Leg - Circumfernential Wound Status:  Open Wounding Event: Gradually Appeared Comorbid Lymphedema, Hypertension, Type II History: Diabetes Date Acquired: 02/21/2015 Weeks Of Treatment: 5 Clustered Wound: Yes Photos Photo Uploaded By: Gretta Cool, RN, BSN, Kim on 04/01/2015 16:55:31 Wound Measurements Length: (cm) 12 Width: (cm) 18 Depth: (cm) 0.1 Area: (cm) 169.646 Volume: (cm) 16.965 % Reduction in Area: 52.5% % Reduction in Volume: 52.5% Epithelialization: None Wound Description Classification: Partial Thickness Diabetic Severity (Wagner): Grade 1 Wound Margin: Indistinct, nonvisible Exudate Amount: Large Exudate Type: Serous Exudate Color: amber Wound Bed Granulation Amount: None Present (0%) Exposed Structure Necrotic Amount: None Present (0%) Fascia Exposed: No Seipp, Cayla K. ([id number]) Fat Layer Exposed: No Tendon Exposed: No Muscle Exposed: No Joint Exposed: No Bone Exposed: No Limited to Skin Breakdown Periwound Skin Texture Texture Color No Abnormalities Noted: No No Abnormalities Noted: No Callus: No Atrophie Blanche: No Crepitus: No Cyanosis: No Excoriation: No Ecchymosis: No Fluctuance: No Erythema: No Friable: No Hemosiderin Staining: Yes Induration: No Mottled: No Localized Edema: No Pallor: No Rash: No Rubor: No Scarring: Yes Moisture No Abnormalities Noted: No Dry / Scaly: No Maceration: No Moist: Yes Wound Preparation Ulcer Cleansing: Rinsed/Irrigated with Saline Topical Anesthetic Applied: None Electronic Signature(s) Signed: 04/01/2015 4:37:40 PM By: Montey Hora Entered By: Montey Hora on 04/01/2015 11:04:14 [name] ([id number]) -------------------------------------------------------------------------------- Wound Assessment Details Patient Name: [name]. Date of Service: 04/01/2015 10:30 AM Medical Record Number: [id number] Patient Account Number: [id number] Date of Birth/Sex: 08/06/1955 (59 y.o. Female) Treating RN: Montey Hora Primary Care Physician: [name] Other Clinician: Referring Physician: [name] Treating Physician/Extender: BURNS III, WALTER Weeks in Treatment: 105 Wound Status Wound Number: 54 Primary Lymphedema Etiology: Wound Location: Left Lower Leg - Circumfernential Wound Status: Open Wounding Event: Gradually Appeared Comorbid Lymphedema, Hypertension, Type II History: Diabetes Date Acquired: 03/26/2015 Weeks Of Treatment: 0 Clustered Wound: Yes Photos Photo Uploaded By: Gretta Cool, RN, BSN, Kim on 04/01/2015 16:56:02 Wound Measurements Length: (cm) 4 Width: (cm) 31 Depth: (cm) 0.1 Area: (cm) 97.389 Volume: (cm) 9.739 % Reduction in Area: 0% % Reduction in Volume: 0% Wound Description Classification: Partial Thickness Diabetic Severity (Wagner): Grade 1 Wound Margin: Indistinct, nonvisible Exudate Amount: Small Exudate Type: Serous Exudate Color: amber Wound Bed Granulation Amount: Small (1-33%) Exposed Structure Necrotic Amount: Small (1-33%) Fascia Exposed: No Lins, Haruna K. ([id number]) Necrotic Quality: Adherent Slough Fat Layer Exposed: No Tendon Exposed: No Muscle Exposed: No Joint Exposed: No Bone Exposed: No Limited to Skin Breakdown Periwound Skin Texture Texture Color No Abnormalities Noted: No No Abnormalities Noted: No Callus: No Atrophie Blanche: No Crepitus: No Cyanosis: No Excoriation: No Ecchymosis: No Fluctuance: No Erythema: No Friable: No Hemosiderin Staining: Yes Induration: No Mottled: No Localized Edema: No Pallor: No Rash: No Rubor: No Scarring: No Temperature / Pain Moisture Temperature: No Abnormality No Abnormalities Noted: No Dry / Scaly: Yes Maceration: No Moist: No Wound Preparation Ulcer Cleansing: Rinsed/Irrigated with Saline Topical Anesthetic Applied: None Electronic Signature(s) Signed: 04/01/2015 4:37:40 PM By: Montey Hora Entered By: Montey Hora on 04/01/2015 11:04:42 [name]  ([id number]) -------------------------------------------------------------------------------- Vitals Details Patient Name: [name]. Date of Service: 04/01/2015 10:30 AM Medical Record Number: [id number] Patient Account Number: [id number] Date of Birth/Sex: 1956-04-07 (59 y.o. Female) Treating RN: Montey Hora Primary Care Physician: [name] Other Clinician: Referring Physician: [name] Treating Physician/Extender: BURNS III, WALTER Weeks in Treatment: [place]  Signs Time Taken: 10:48 Temperature (F): 97.7 Height (in): 65 Pulse (bpm): 53 Respiratory Rate (breaths/min): 18 Blood Pressure (mmHg): 199/67 Reference Range: 80 - 120 mg / dl Electronic Signature(s) Signed: 04/01/2015 4:37:40 PM By: Montey Hora Entered By: Montey Hora on 04/01/2015 10:52:12

## 2015-04-02 NOTE — Progress Notes (Signed)
Name: [name]   MRN: [id number]    DOB: 1955-09-13   Date:04/02/2015       Progress Note  Subjective  Chief Complaint  Chief Complaint  Patient presents with  . Hypertension  . Diabetes  . Leg Swelling    takes Lasix  . Hypothyroidism    HPI Comments: Obesity suggestion for futher wt loss/ Has lost 30 lbs/ pt has leg swelling on lasix  Hypertension This is a chronic problem. The current episode started more than 1 year ago. The problem has been waxing and waning since onset. The problem is controlled. Pertinent negatives include no anxiety, blurred vision, chest pain, headaches, malaise/fatigue, neck pain, orthopnea, palpitations, peripheral edema, PND, shortness of breath or sweats. Agents associated with hypertension include NSAIDs. Risk factors for coronary artery disease include dyslipidemia and obesity. Past treatments include central alpha agonists, diuretics and beta blockers. The current treatment provides mild improvement. There are no compliance problems.  Hypertensive end-organ damage includes kidney disease, renovascular disease and a thyroid problem. There is no history of angina, CAD/MI, CVA, heart failure, left ventricular hypertrophy, PVD or retinopathy. There is no history of chronic renal disease.  Diabetes She presents for her follow-up diabetic visit. She has type 2 diabetes mellitus. Pertinent negatives for hypoglycemia include no confusion, headaches, nervousness/anxiousness, pallor, sleepiness or sweats. Associated symptoms include fatigue. Pertinent negatives for diabetes include no blurred vision and no chest pain. There are no hypoglycemic complications. Diabetic symptom progression: not taking readings. Pertinent negatives for diabetic complications include no CVA, PVD or retinopathy. Risk factors for coronary artery disease include diabetes mellitus. Current diabetic treatment includes oral agent (monotherapy) (hold metformen due to renal insufficiency). She is  compliant with treatment all of the time. She is following a generally healthy diet. She has not had a previous visit with a dietitian. She rarely participates in exercise. An ACE inhibitor/angiotensin II receptor blocker is not being taken.  Thyroid Problem Presents for follow-up visit. Symptoms include fatigue and leg swelling. Patient reports no anxiety, cold intolerance, constipation, depressed mood, hair loss or palpitations. The symptoms have been improving. Past treatments include levothyroxine. The treatment provided moderate relief. Her past medical history is significant for diabetes and obesity. There is no history of heart failure.    No problem-specific assessment & plan notes found for this encounter.   Past Medical History  Diagnosis Date  . Hypertension   . Hypothyroidism   . Diabetes mellitus without complication ([place]) [id number]  . Kidney stone 11/2014    history of    History reviewed. No pertinent past surgical history.  Family History  Problem Relation Age of Onset  . Cancer Mother   . Diabetes Father     Social History   Social History  . Marital Status: Single    Spouse Name: N/A  . Number of Children: N/A  . Years of Education: N/A   Occupational History  . Not on file.   Social History Main Topics  . Smoking status: Never Smoker   . Smokeless tobacco: Not on file  . Alcohol Use: No  . Drug Use: No  . Sexual Activity: No   Other Topics Concern  . Not on file   Social History Narrative    No Known Allergies   Review of Systems  Constitutional: Positive for fatigue. Negative for malaise/fatigue.  Eyes: Negative for blurred vision.  Respiratory: Negative for shortness of breath.   Cardiovascular: Negative for chest pain, palpitations, orthopnea and PND.  Gastrointestinal:  Negative for constipation.  Musculoskeletal: Negative for neck pain.  Skin: Negative for pallor.  Neurological: Negative for headaches.  Endo/Heme/Allergies: Negative for  cold intolerance.  Psychiatric/Behavioral: Negative for confusion. The patient is not nervous/anxious.      Objective  Filed Vitals:   04/02/15 1434  BP: 120/80  Pulse: 80  Height: 5\' 5"  (1.651 m)  Weight: 250 lb (113.399 kg)    Physical Exam  Constitutional: She is well-developed, well-nourished, and in no distress. No distress.  HENT:  Head: Normocephalic and atraumatic.  Right Ear: External ear normal.  Left Ear: External ear normal.  Nose: Nose normal.  Mouth/Throat: Oropharynx is clear and moist.  Eyes: Conjunctivae and EOM are normal. Pupils are equal, round, and reactive to light. Right eye exhibits no discharge. Left eye exhibits no discharge.  Neck: Normal range of motion. Neck supple. No JVD present. No thyromegaly present.  Cardiovascular: Normal rate, regular rhythm, normal heart sounds and intact distal pulses.  Exam reveals no gallop and no friction rub.   No murmur heard. Pulmonary/Chest: Effort normal and breath sounds normal.  Abdominal: Soft. Bowel sounds are normal. She exhibits no mass. There is no tenderness. There is no guarding.  Musculoskeletal: Normal range of motion. She exhibits no edema.  Lymphadenopathy:    She has no cervical adenopathy.  Neurological: She is alert.  Skin: Skin is warm and dry. She is not diaphoretic.  Psychiatric: Mood and affect normal.      Assessment & Plan  Problem List Items Addressed This Visit    None    Visit Diagnoses    Acute renal insufficiency    -  Primary    hold lasix and etodolac    Relevant Orders    COMPLETE METABOLIC PANEL WITH GFR    Essential hypertension        Relevant Medications    cloNIDine (CATAPRES) 0.3 MG tablet    metoprolol succinate (TOPROL-XL) 50 MG 24 hr tablet    telmisartan (MICARDIS) 80 MG tablet    Other Relevant Orders    COMPLETE METABOLIC PANEL WITH GFR    Type 2 diabetes mellitus without complication, without long-term current use of insulin (HCC)        hold metformen     Relevant Medications    telmisartan (MICARDIS) 80 MG tablet    Other Relevant Orders    COMPLETE METABOLIC PANEL WITH GFR    Lipid Profile    Hypothyroidism, unspecified hypothyroidism type        Relevant Medications    levothyroxine (SYNTHROID, LEVOTHROID) 200 MCG tablet    metoprolol succinate (TOPROL-XL) 50 MG 24 hr tablet    Other Relevant Orders    COMPLETE METABOLIC PANEL WITH GFR    TSH    Edema extremities        Relevant Orders    COMPLETE METABOLIC PANEL WITH GFR         Dr. Deanna Jones [facility]  04/02/2015

## 2015-04-02 NOTE — Progress Notes (Signed)
GILL, SABAT ([id number]) Visit Report for 04/01/2015 Chief Complaint Document Details Patient Name: [name], [name]. Date of Service: 04/01/2015 10:30 AM Medical Record Number: [id number] Patient Account Number: [id number] Date of Birth/Sex: August 09, 1955 (59 y.o. 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 Electronic Signature(s) Signed: 04/01/2015 4:13:26 PM By: Loletha Grayer MD Entered By: Loletha Grayer on 04/01/2015 12:59:59 [name] ([id number]) -------------------------------------------------------------------------------- HPI Details Patient Name: [name] Date of Service: 04/01/2015 10:30 AM Medical Record Number: [id number] Patient Account Number: [id number] Date of Birth/Sex: 13-Jun-1956 (59 y.o. Female) Treating RN: Cornell Barman Primary Care Physician: [name] Other Clinician: Referring Physician: [name] Treating Physician/Extender: BURNS III, Charlean Sanfilippo in Treatment: 40 History of Present Illness HPI Description: 59yo w/ h/o BLE phlebolymphedema, type 2 DM, and obesity. No PVD. h/o chronic, recurrent BLE calf ulcers. Treated with 4 layer compression. Infrequently uses lymphedema pump. Bilateral lower extremity ulcerations healed as of June 2016. Fitted for custom compression stockings but not yet received. Awaiting lymphedema consult. She was transitioned to juxtalite compression garment on the left. Compression bandage applied to right leg on 02/25/2015. Patient has not followed up since that time, wearing same compression bandage. She returns to clinic today and has developed recurrent bilateral calf ulcerations. No significant pain.  No fever or chills. Moderate drainage. Electronic Signature(s) Signed: 04/01/2015 4:13:26 PM By: Loletha Grayer MD Entered By: Loletha Grayer on 04/01/2015 13:01:13 [name] ([id number]) -------------------------------------------------------------------------------- Physical Exam Details Patient Name: [name] Date of Service: 04/01/2015 10:30 AM Medical Record Number: [id number] Patient Account Number: [id number] Date of Birth/Sex: June 01, 1956 (59 y.o. Female) Treating RN: Cornell Barman Primary Care Physician: [name] Other Clinician: Referring Physician: [name] Treating Physician/Extender: BURNS III, WALTER Weeks in Treatment: 105 Constitutional . Pulse regular. Respirations normal and unlabored. Afebrile. Marland Kitchen Respiratory WNL. No retractions.. Cardiovascular Pedal Pulses WNL. Integumentary (Hair, Skin) .Marland Kitchen Neurological . Psychiatric . Oriented times 3.. No evidence of depression, anxiety, or agitation.. Notes Bilateral calf ulcerations. Full thickness. No evidence for infection. 2+ pitting edema bilaterally. Palpable DP bilaterally. Electronic Signature(s) Signed: 04/01/2015 4:13:26 PM By: Loletha Grayer MD Entered By: Loletha Grayer on 04/01/2015 13:01:56 [name] ([id number]) -------------------------------------------------------------------------------- Physician Orders Details Patient Name: [name] Date of Service: 04/01/2015 10:30 AM Medical Record Number: [id number] Patient Account Number: [id number] Date of Birth/Sex: 05/02/1956 (59 y.o. Female) Treating RN: Montey Hora Primary Care Physician: [name] Other Clinician: Referring Physician: [name] Treating Physician/Extender: BURNS III, Charlean Sanfilippo in Treatment: 105 Verbal / Phone Orders: Yes Clinician: Montey Hora Read Back and Verified: Yes Diagnosis Coding Wound Cleansing Wound #53 Right,Circumferential Lower Leg o Cleanse  wound with mild soap and water Wound #54 Left,Circumferential Lower Leg o Cleanse wound with mild soap and water Primary Wound Dressing Wound #53 Right,Circumferential Lower Leg o Other: - bacitracin Wound #54 Left,Circumferential Lower Leg o Other: - bacitracin Secondary Dressing Wound #53 Right,Circumferential Lower Leg o ABD and Kerlix/Conform Wound #54 Left,Circumferential Lower Leg o ABD and Kerlix/Conform Dressing Change Frequency Wound #53 Right,Circumferential Lower Leg o Change dressing every day. Wound #54 Left,Circumferential Lower Leg o Change dressing every day. Follow-up Appointments Wound #53 Right,Circumferential Lower Leg o Other: - Patient will return, if necessary following hip surgery and rehab Edema Control Wound #53 Right,Circumferential Lower  Leg o Tubigrip - until Juxtalite is available [name], [name] ([id number]) Wound #54 Left,Circumferential Lower Leg o Patient to wear own Juxtalite compression garment. - left leg Additional Orders / Instructions Wound #53 Right,Circumferential Lower Leg o Increase protein intake. Wound #54 Left,Circumferential Lower Leg o Increase protein intake. o Other: - order Juzo for both lower legs Electronic Signature(s) Signed: 04/01/2015 4:13:26 PM By: Loletha Grayer MD Signed: 04/01/2015 4:54:35 PM By: Gretta Cool RN, BSN, Kim RN, BSN Entered By: Gretta Cool, RN, BSN, Kim on 04/01/2015 11:59:21 [name] ([id number]) -------------------------------------------------------------------------------- Problem List Details Patient Name: [name]. Date of Service: 04/01/2015 10:30 AM Medical Record Number: [id number] Patient Account Number: [id number] Date of Birth/Sex: December 19, 1955 (59 y.o. Female) Treating RN: Cornell Barman Primary Care Physician: [name] Other Clinician: Referring Physician: [name] Treating Physician/Extender: BURNS III, Charlean Sanfilippo in Treatment:  105 Active Problems ICD-9 Encounter Code Description Active Date Diagnosis 457.1 Other Lymphedema - excludes congenital, eyelid or vulva 03/27/2013 Yes 459.33 Chronic venous hypertension with ulcer and inflammation 03/27/2013 Yes 707.12 Ulcer of lower limbs, except pressure ulcer; Ulcer of calf 03/27/2013 Yes ICD-10 Encounter Code Description Active Date Diagnosis I89.0 Lymphedema, not elsewhere classified 03/26/2014 Yes E66.9 Obesity, unspecified 03/26/2014 Yes E11.40 Type 2 diabetes mellitus with diabetic neuropathy, 01/14/2015 Yes unspecified R26.89 Other abnormalities of gait and mobility 01/14/2015 Yes 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 ([id number]) [id number] Varicose veins of left lower extremity with both ulcer of 11/12/2014 Yes calf and inflammation Inactive Problems Resolved Problems ICD-10 Code Description Active Date Resolved Date E11.621 Type 2 diabetes mellitus with foot ulcer 10/08/2014 10/09/2014 L84 Corns and callosities 11/12/2014 11/12/2014 L03.115 Cellulitis of right lower limb 02/25/2015 02/25/2015 Electronic Signature(s) Signed: 04/01/2015 4:13:26 PM By: Loletha Grayer MD Entered By: Loletha Grayer on 04/01/2015 12:59:31 [name] ([id number]) -------------------------------------------------------------------------------- Progress Note Details Patient Name: [name]. Date of Service: 04/01/2015 10:30 AM Medical Record Number: [id number] Patient Account Number: [id number] Date of Birth/Sex: 04-24-1956 (59 y.o. Female) Treating RN: Cornell Barman Primary Care Physician: [name] Other Clinician: Referring Physician: [name] Treating Physician/Extender: BURNS III, Charlean Sanfilippo in Treatment: 105 Subjective Chief Complaint Information obtained from Patient Bilateral lower extremity  phlebolymphedema and recurrent bilateral calf ulcerations. History of Present Illness (HPI) 60yo w/ h/o BLE phlebolymphedema, type 2 DM, and obesity. No PVD. h/o chronic, recurrent BLE calf ulcers. Treated with 4 layer compression. Infrequently uses lymphedema pump. Bilateral lower extremity ulcerations healed as of June 2016. Fitted for custom compression stockings but not yet received. Awaiting lymphedema consult. She was transitioned to juxtalite compression garment on the left. Compression bandage applied to right leg on 02/25/2015. Patient has not followed up since that time, wearing same compression bandage. She returns to clinic today and has developed recurrent bilateral calf ulcerations. No significant pain. No fever or chills. Moderate drainage. Objective Constitutional Pulse regular. Respirations normal and unlabored. Afebrile. Vitals Time Taken: 10:48 AM, Height: 65 in, Temperature: 97.7 F, Pulse: 53 bpm, Respiratory Rate: 18 breaths/min, Blood Pressure: 199/67 mmHg. Respiratory WNL. No retractions.. Cardiovascular Pedal Pulses WNL. Psychiatric [name], [name] ([id number]) Oriented times 3.. No evidence of depression, anxiety, or agitation.. General Notes: Bilateral calf ulcerations. Full thickness. No evidence for infection. 2+ pitting edema bilaterally. Palpable DP bilaterally. Integumentary (Hair, Skin) Wound #53 status is Open. Original cause of wound was Gradually Appeared. The wound  is located on the Right,Circumferential Lower Leg. The wound measures 12cm length x 18cm width x 0.1cm depth; 169.646cm^2 area and 16.965cm^3 volume. The wound is limited to skin breakdown. There is a large amount of serous drainage noted. The wound margin is indistinct and nonvisible. There is no granulation within the wound bed. There is no necrotic tissue within the wound bed. The periwound skin appearance exhibited: Scarring, Moist, Hemosiderin Staining. The periwound skin  appearance did not exhibit: Callus, Crepitus, Excoriation, Fluctuance, Friable, Induration, Localized Edema, Rash, Dry/Scaly, Maceration, Atrophie Blanche, Cyanosis, Ecchymosis, Mottled, Pallor, Rubor, Erythema. Wound #54 status is Open. Original cause of wound was Gradually Appeared. The wound is located on the Left,Circumferential Lower Leg. The wound measures 4cm length x 31cm width x 0.1cm depth; 97.389cm^2 area and 9.739cm^3 volume. The wound is limited to skin breakdown. There is a small amount of serous drainage noted. The wound margin is indistinct and nonvisible. There is small (1-33%) granulation within the wound bed. There is a small (1-33%) amount of necrotic tissue within the wound bed including Adherent Slough. The periwound skin appearance exhibited: Dry/Scaly, Hemosiderin Staining. The periwound skin appearance did not exhibit: Callus, Crepitus, Excoriation, Fluctuance, Friable, Induration, Localized Edema, Rash, Scarring, Maceration, Moist, Atrophie Blanche, Cyanosis, Ecchymosis, Mottled, Pallor, Rubor, Erythema. Periwound temperature was noted as No Abnormality. 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 ([id number]) Recurrent bilateral calf ulcerations. Plan Wound Cleansing: Wound #53 Right,Circumferential Lower Leg: Cleanse wound  with mild soap and water Wound #54 Left,Circumferential Lower Leg: Cleanse wound with mild soap and water Primary Wound Dressing: Wound #53 Right,Circumferential Lower Leg: Other: - bacitracin Wound #54 Left,Circumferential Lower Leg: Other: - bacitracin Secondary Dressing: Wound #53 Right,Circumferential Lower Leg: ABD and Kerlix/Conform Wound #54 Left,Circumferential Lower Leg: ABD and Kerlix/Conform Dressing Change Frequency: Wound #53 Right,Circumferential Lower Leg: Change dressing every day. Wound #54 Left,Circumferential Lower Leg: Change dressing every day. Follow-up Appointments: Wound #53 Right,Circumferential Lower Leg: Other: - Patient will return, if necessary following hip surgery and rehab Edema Control: Wound #53 Right,Circumferential Lower Leg: Tubigrip - until Juxtalite is available Wound #54 Left,Circumferential Lower Leg: Patient to wear own Juxtalite compression garment. - left leg Additional Orders / Instructions: Wound #53 Right,Circumferential Lower Leg: Increase protein intake. Wound #54 Left,Circumferential Lower Leg: Increase protein intake. Other: - order Juzo for both lower legs [name], [name] ([id number]) I explained that I do not think it is prudent for [name] to apply a compression bandage if she is not going to return to clinic for over 1 month and not remove the compression bandage. Recommended mupirocin cream and juxtalite compression garment bilaterally. Lymphedema pump 2 hours daily. Frequent leg elevation. Electronic Signature(s) Signed: 04/01/2015 4:13:26 PM By: Loletha Grayer MD Entered By: Loletha Grayer on 04/01/2015 13:02:51 [name] ([id number]) -------------------------------------------------------------------------------- SuperBill Details Patient Name: [name] Date of Service: 04/01/2015 Medical Record Number: [id number] Patient Account Number: [id number] Date of  Birth/Sex: [birth year] (59 y.o.  Female) Treating RN: Cornell Barman Primary Care Physician: [name] Other Clinician: Referring Physician: [name] Treating Physician/Extender: BURNS III, Charlean Sanfilippo in Treatment: 105 Diagnosis Coding ICD-9 Codes Code Description 457.1 Other Lymphedema - excludes congenital, eyelid or vulva 459.33 Chronic venous hypertension with ulcer and inflammation 707.12 Ulcer of lower limbs, except pressure ulcer; Ulcer of calf ICD-10 Codes Code Description I89.0 Lymphedema, not elsewhere classified E66.9 Obesity, unspecified E11.40 Type 2 diabetes mellitus with diabetic neuropathy, unspecified R26.89 Other abnormalities of gait and mobility 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 I83.222 Varicose veins of left lower extremity with both ulcer of calf and inflammation E11.622 Type 2 diabetes mellitus with other skin ulcer Facility Procedures CPT4 Code: [id number] Description: 99214 - WOUND CARE VISIT-LEV 4 EST PT Modifier: Quantity: 1 Physician Procedures CPT4: Description Modifier Quantity Code [id number] 99214 - WC PHYS LEVEL 4 - EST PT 1 ICD-10 Description Diagnosis 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

## 2015-04-03 LAB — LIPID PANEL
CHOL/HDL RATIO: 3.7 ratio (ref 0.0–4.4)
Cholesterol, Total: 198 mg/dL (ref 100–199)
HDL: 53 mg/dL (ref >39)
LDL Calculated: 130 mg/dL — ABNORMAL HIGH (ref 0–99)
TRIGLYCERIDES: 73 mg/dL (ref 0–149)
VLDL Cholesterol Cal: 15 mg/dL (ref 5–40)

## 2015-04-03 LAB — TSH: TSH: 0.138 u[IU]/mL — ABNORMAL LOW (ref 0.450–4.500)

## 2015-04-07 ENCOUNTER — Inpatient Hospital Stay
Admission: RE | Admit: 2015-04-07 | Payer: Managed Care, Other (non HMO) | Source: Ambulatory Visit | Admitting: Orthopedic Surgery

## 2015-04-07 ENCOUNTER — Encounter: Payer: Managed Care, Other (non HMO) | Admitting: Surgery

## 2015-04-07 DIAGNOSIS — L97211 Non-pressure chronic ulcer of right calf limited to breakdown of skin: Secondary | ICD-10-CM | POA: Diagnosis not present

## 2015-04-07 SURGERY — ARTHROPLASTY, HIP, TOTAL, ANTERIOR APPROACH
Anesthesia: Choice | Laterality: Right

## 2015-04-08 ENCOUNTER — Other Ambulatory Visit: Payer: Self-pay | Admitting: Nephrology

## 2015-04-08 DIAGNOSIS — N184 Chronic kidney disease, stage 4 (severe): Secondary | ICD-10-CM

## 2015-04-08 LAB — CREATINE: Creatine, Serum: 0 mg/dL — ABNORMAL LOW (ref 0.1–1.0)

## 2015-04-08 NOTE — Progress Notes (Addendum)
SHELESE, GEESAMAN ([id number]) Visit Report for 04/07/2015 Chief Complaint Document Details Patient Name: [name], [name] 04/07/2015 11:00 Date of Service: AM Medical Record [id number] Number: Patient Account Number: [id number] [birth year] (59 y.o. 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 Electronic Signature(s) Signed: 04/07/2015 12:12:45 PM By: Christin Fudge MD, FACS Entered By: Christin Fudge on 04/07/2015 12:12:45 [name] ([id number]) -------------------------------------------------------------------------------- HPI Details Patient Name: [name] 04/07/2015 11:00 Date of Service: AM Medical Record [id number] Number: Patient Account Number: [id number] 11/07/1955 (59 y.o. Treating RN: Montey Hora Date of Birth/Sex: Female) Other Clinician: Primary Care Physician: [name] Treating Christin Fudge Referring Physician: [name] Physician/Extender: Suella Grove in Treatment: 105 History of Present Illness HPI Description: 59yo w/ h/o BLE phlebolymphedema, type 2 DM, and obesity. No PVD. h/o chronic, recurrent BLE calf ulcers. Treated with 4 layer compression. Infrequently uses lymphedema pump. Bilateral lower extremity ulcerations healed as of June 2016. Fitted for custom compression stockings but not yet received. Awaiting lymphedema consult. She was transitioned to juxtalite compression garment on the left. Compression bandage applied to right leg on 02/25/2015. Patient has not followed up since that time, wearing same compression bandage. She returns to clinic today and has developed recurrent bilateral calf ulcerations. No significant pain.  No fever or chills. Moderate drainage. 04/07/2015 -- her hip surgery was canceled due to medical reasons and she is here without any compression today. I understand from Dr. Quay Burow note that she has been very noncompliant in the last time did not come back for over a month and had the same compression bandage. Today she does not have any compression and she says they were wet and hence she didn't wear them. Electronic Signature(s) Signed: 04/07/2015 12:15:21 PM By: Christin Fudge MD, FACS Entered By: Christin Fudge on 04/07/2015 12:15:21 [name] ([id number]) -------------------------------------------------------------------------------- Physical Exam Details Patient Name: [name] 04/07/2015 11:00 Date of Service: AM Medical Record [id number] Number: Patient Account Number: [id number] 1956/04/05 (59 y.o. Treating RN: Montey Hora Date of Birth/Sex: Female) Other Clinician: Primary Care Physician: [name] Treating Christin Fudge Referring Physician: [name] Physician/Extender: Suella Grove in Treatment: 105 Constitutional . Pulse regular. Respirations normal and unlabored. Afebrile. . Eyes Nonicteric. Reactive to light. Ears, Nose, Mouth, and Throat Lips, teeth, and gums WNL.Marland Kitchen Moist mucosa without lesions . Neck supple and nontender. No palpable supraclavicular or cervical adenopathy. Normal sized without goiter. Respiratory WNL. No retractions.. Cardiovascular Pedal Pulses WNL. +2 pitting edema bilaterally.. Lymphatic No adneopathy. No adenopathy. No adenopathy. Musculoskeletal Adexa without tenderness or enlargement.. Digits and nails w/o clubbing, cyanosis, infection, petechiae, ischemia, or inflammatory conditions.. Integumentary (Hair, Skin) No suspicious lesions. No crepitus or fluctuance. No peri-wound warmth or erythema. No masses.Marland Kitchen Psychiatric Judgement and insight Intact.. No evidence of depression, anxiety, or agitation.. Notes she has  massive edema today with open superficial ulceration both lower extremities. She also has some blebs but no cellulitis. Electronic Signature(s) Signed: 04/07/2015 12:16:41 PM By: Christin Fudge MD, FACS Entered By: Christin Fudge on 04/07/2015 12:16:40 [name] ([id number]) -------------------------------------------------------------------------------- Physician Orders Details Patient Name: [name] 04/07/2015 11:00 Date of Service: AM Medical Record [id number] Number: Patient Account Number: [id number] [birth year] (59 y.o. Treating RN: Montey Hora Date of Birth/Sex: Female) Other Clinician: Primary Care Physician: [name] Treating Christin Fudge Referring Physician: Ronnald Ramp,  Deanna Physician/Extender: Weeks in Treatment: 105 Verbal / Phone Orders: Yes Clinician: Montey Hora Read Back and Verified: Yes Diagnosis Coding Wound Cleansing Wound #53 Right,Circumferential Lower Leg o Cleanse wound with mild soap and water Wound #54 Left,Circumferential Lower Leg o Cleanse wound with mild soap and water Primary Wound Dressing Wound #53 Right,Circumferential Lower Leg o Aquacel Ag o Other: - bacitracin Wound #54 Left,Circumferential Lower Leg o Aquacel Ag o Other: - bacitracin Secondary Dressing Wound #53 Right,Circumferential Lower Leg o ABD and Kerlix/Conform Wound #54 Left,Circumferential Lower Leg o ABD and Kerlix/Conform Dressing Change Frequency Wound #53 Right,Circumferential Lower Leg o Change dressing every day. Wound #54 Left,Circumferential Lower Leg o Change dressing every day. Follow-up Appointments Wound #53 Right,Circumferential Lower Leg o Other: - Patient will return, if necessary following hip surgery and rehab CHASYA, HEO ([id number]) Wound #54 Left,Circumferential Lower Leg o Other: - Patient will return, if necessary following hip surgery and rehab Edema Control Wound #53 Right,Circumferential  Lower Leg o Patient to wear own Juxtalite/Juzo compression garment. Wound #54 Left,Circumferential Lower Leg o Patient to wear own Juxtalite/Juzo compression garment. Additional Orders / Instructions Wound #53 Right,Circumferential Lower Leg o Increase protein intake. Wound #54 Left,Circumferential Lower Leg o Increase protein intake. Electronic Signature(s) Signed: 04/07/2015 4:43:21 PM By: Christin Fudge MD, FACS Signed: 04/07/2015 5:00:37 PM By: Montey Hora Entered By: Montey Hora on 04/07/2015 12:06:40 [name] ([id number]) -------------------------------------------------------------------------------- Problem List Details Patient Name: SHENETTE, HOLST 04/07/2015 11:00 Date of Service: AM Medical Record [id number] Number: Patient Account Number: [id number] 05-19-1956 (59 y.o. Treating RN: Montey Hora Date of Birth/Sex: Female) Other Clinician: Primary Care Physician: [name] Treating Kyle Stansell Referring Physician: [name] Physician/Extender: Suella Grove in Treatment: 105 Active Problems ICD-9 Encounter Code Description Active Date Diagnosis 457.1 Other Lymphedema - excludes congenital, eyelid or vulva 03/27/2013 Yes 459.33 Chronic venous hypertension with ulcer and inflammation 03/27/2013 Yes 707.12 Ulcer of lower limbs, except pressure ulcer; Ulcer of calf 03/27/2013 Yes ICD-10 Encounter Code Description Active Date Diagnosis I89.0 Lymphedema, not elsewhere classified 03/26/2014 Yes E66.9 Obesity, unspecified 03/26/2014 Yes E11.40 Type 2 diabetes mellitus with diabetic neuropathy, 01/14/2015 Yes unspecified R26.89 Other abnormalities of gait and mobility 01/14/2015 Yes 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 ([id number]) E11.622 Type 2 diabetes  mellitus with other skin ulcer 02/25/2015 Yes Inactive Problems Resolved Problems ICD-10 Code Description Active Date Resolved Date E11.621 Type 2 diabetes mellitus with foot ulcer 10/08/2014 10/09/2014 L03.115 Cellulitis of right lower limb 02/25/2015 02/25/2015 L84 Corns and callosities 11/12/2014 11/12/2014 Electronic Signature(s) Signed: 04/07/2015 12:12:30 PM By: Christin Fudge MD, FACS Entered By: Christin Fudge on 04/07/2015 12:12:30 [name] ([id number]) -------------------------------------------------------------------------------- Progress Note Details Patient Name: [name] 04/07/2015 11:00 Date of Service: AM Medical Record [id number] Number: Patient Account Number: [id number] 17-Jun-1956 (59 y.o. 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 History of Present Illness (HPI) 59yo w/ h/o BLE phlebolymphedema, type 2 DM, and obesity. No PVD. h/o chronic, recurrent BLE calf ulcers. Treated with 4 layer compression. Infrequently uses lymphedema pump. Bilateral lower extremity ulcerations healed as of June 2016. Fitted for custom compression stockings but not yet received.  Awaiting lymphedema consult. She was transitioned to juxtalite compression garment on the left. Compression bandage applied to right leg on 02/25/2015. Patient has not followed up since that time, wearing same compression bandage. She returns to clinic today and has developed recurrent bilateral calf ulcerations. No significant pain. No fever or chills. Moderate drainage. 04/07/2015 -- her hip surgery was canceled due to medical reasons and she is here without any compression today.  I understand from Dr. Quay Burow note that she has been very noncompliant in the last time did not come back for over a month and had the same compression bandage. Today she does not have any compression and she says they were wet and hence she didn't wear them. Objective Constitutional Pulse regular. Respirations normal and unlabored. Afebrile. Vitals Time Taken: 11:36 AM, Height: 65 in, Temperature: 98.2 F, Pulse: 66 bpm, Respiratory Rate: 18 breaths/min, Blood Pressure: 182/59 mmHg. Eyes Gusler, Maddalynn K. ([id number]) Nonicteric. Reactive to light. Ears, Nose, Mouth, and Throat Lips, teeth, and gums WNL.Marland Kitchen Moist mucosa without lesions . Neck supple and nontender. No palpable supraclavicular or cervical adenopathy. Normal sized without goiter. Respiratory WNL. No retractions.. Cardiovascular Pedal Pulses WNL. +2 pitting edema bilaterally.. Lymphatic No adneopathy. No adenopathy. No adenopathy. Musculoskeletal Adexa without tenderness or enlargement.. Digits and nails w/o clubbing, cyanosis, infection, petechiae, ischemia, or inflammatory conditions.Marland Kitchen Psychiatric Judgement and insight Intact.. No evidence of depression, anxiety, or agitation.. General Notes: she has massive edema today with open superficial ulceration both lower extremities. She also has some blebs but no cellulitis. Integumentary (Hair, Skin) No suspicious lesions. No crepitus or fluctuance. No peri-wound warmth or erythema. No masses.. Wound #53 status is Open. Original cause of wound was Gradually Appeared. The wound is located on the Right,Circumferential Lower Leg. The wound measures 10.5cm length x 27cm width x 0.1cm depth; 222.66cm^2 area and 22.266cm^3 volume. The wound is limited to skin breakdown. There is no tunneling or undermining noted. There is a large amount of serous drainage noted. The wound margin is indistinct and nonvisible. There is large (67-100%) red granulation within the wound bed. There is  no necrotic tissue within the wound bed. The periwound skin appearance exhibited: Scarring, Moist, Hemosiderin Staining. The periwound skin appearance did not exhibit: Callus, Crepitus, Excoriation, Fluctuance, Friable, Induration, Localized Edema, Rash, Dry/Scaly, Maceration, Atrophie Blanche, Cyanosis, Ecchymosis, Mottled, Pallor, Rubor, Erythema. Wound #54 status is Open. Original cause of wound was Gradually Appeared. The wound is located on the Left,Circumferential Lower Leg. The wound measures 16.5cm length x 43.5cm width x 0.1cm depth; 563.72cm^2 area and 56.372cm^3 volume. The wound is limited to skin breakdown. There is no tunneling or undermining noted. There is a small amount of serous drainage noted. The wound margin is indistinct and nonvisible. There is large (67-100%) red granulation within the wound bed. There is no necrotic tissue within the wound bed. The periwound skin appearance exhibited: Dry/Scaly, Hemosiderin Staining. The periwound skin appearance did not exhibit: Callus, Crepitus, Excoriation, Fluctuance, Friable, Induration, Localized Edema, Rash, Scarring, Maceration, Moist, Atrophie Blanche, Cyanosis, Ecchymosis, Mottled, Pallor, Rubor, Erythema. Periwound temperature was noted as No Abnormality. 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 Quay Burow told her last week and said it is impossible to compress her if she is not going to be here on a regular basis. I have again discussed with her the need for regularly using her compression stockings starting first thing in the morning and using them all day and taking them out only had bedtime. We will use silver alginate as the primary dressing today. She also says she'll be compliant with her lymphedema pumps using them twice a day for an hour each. She will follow-up with Dr. Quay Burow next week. Plan Wound Cleansing: Wound #53 Right,Circumferential Lower Leg: Cleanse wound with mild soap and water Wound #54 Left,Circumferential Lower Leg: Cleanse wound with mild soap and water Primary Wound Dressing: Wound #53 Right,Circumferential Lower Leg: Aquacel Ag Other: - bacitracin Wound #54 Left,Circumferential Lower Leg: Aquacel Ag Other: - bacitracin Secondary Dressing: Wound #53 Right,Circumferential Lower Leg: ABD and Kerlix/Conform Hoel, Sharika K. ([id number]) Wound #54 Left,Circumferential Lower Leg: ABD and Kerlix/Conform Dressing Change Frequency: Wound #53 Right,Circumferential Lower Leg: Change dressing every day. Wound #54 Left,Circumferential Lower Leg: Change dressing every day. Follow-up Appointments: Wound #53 Right,Circumferential Lower Leg: Other: - Patient will return, if necessary following hip surgery and rehab Wound #54 Left,Circumferential Lower Leg: Other: - Patient will return, if necessary following hip surgery and rehab Edema Control: Wound #53 Right,Circumferential Lower Leg: Patient to wear own Juxtalite/Juzo compression garment. Wound #54 Left,Circumferential Lower Leg: Patient to wear own Juxtalite/Juzo compression garment. Additional Orders / Instructions: Wound #53 Right,Circumferential Lower Leg: Increase protein intake. Wound #54 Left,Circumferential Lower Leg: Increase protein  intake. I have reiterated what Dr. Quay Burow told her last week and said it is impossible to compress her if she is not going to be here on a regular basis. I have again discussed with her the need for regularly using her compression stockings starting first thing in the morning and using them all day and taking them out only had bedtime. We will use silver alginate as the primary dressing today. She also says she'll be compliant with her lymphedema pumps using them twice a day for an hour each. She will follow-up with Dr. Quay Burow next week. Electronic Signature(s) Signed: 04/10/2015 4:07:26 PM By: Christin Fudge MD, FACS Previous Signature: 04/07/2015 12:18:05 PM Version By: Christin Fudge MD, FACS Entered By: Christin Fudge on 04/10/2015 16:07:26 [name] ([id number]) -------------------------------------------------------------------------------- SuperBill Details Patient Name: [name] Date of Service: 04/07/2015 Medical Record Number: [id number] Patient Account Number: [id number] Date of Birth/Sex: [birth year] (59 y.o. Female) Treating RN: Montey Hora Primary Care Physician: [name] Other Clinician: Referring Physician: [name] Treating Physician/Extender: Frann Rider in Treatment: 105 Diagnosis Coding ICD-9 Codes Code Description 457.1 Other Lymphedema - excludes congenital, eyelid or vulva 459.33 Chronic venous hypertension with ulcer and inflammation 707.12 Ulcer of lower limbs, except pressure ulcer; Ulcer of calf ICD-10 Codes Code Description I89.0 Lymphedema, not elsewhere classified E66.9 Obesity, unspecified E11.40 Type 2 diabetes mellitus with diabetic neuropathy, unspecified R26.89 Other abnormalities of gait and mobility 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 I83.222 Varicose veins of left lower extremity with both ulcer of calf and inflammation E11.622 Type 2 diabetes mellitus with  other skin ulcer Facility Procedures CPT4 Code:  [id number] Description: 99213 - WOUND CARE VISIT-LEV 3 EST PT Modifier: Quantity: 1 Physician Procedures CPT4 Code: [id number] Description: [id number] - WC PHYS LEVEL 3 - EST PT ICD-10 Description Diagnosis I89.0 Lymphedema, not elsewhere classified E66.9 Obesity, unspecified E11.40 Type 2 diabetes mellitus with diabetic neuropathy, R26.89 Other abnormalities of gait and mobility Modifier: unspecified Quantity: 1 Electronic Signature(s) Signed: 04/07/2015 5:42:51 PM By: Gretta Cool, RN, BSN, Kim RN, BSN Signed: 04/09/2015 8:02:47 AM By: Christin Fudge MD, FACS [name] ([id number]) Previous Signature: 04/07/2015 12:18:25 PM Version By: Christin Fudge MD, FACS Entered By: Gretta Cool, RN, BSN, Kim on 04/07/2015 17:32:17

## 2015-04-08 NOTE — Progress Notes (Signed)
GAVRIELLE, NEVES ([id number]) Visit Report for 04/07/2015 Arrival Information Details Patient Name: Autumn, [name]. Date of Service: 04/07/2015 11:00 AM Medical Record Number: [id number] Patient Account Number: [id number] Date of Birth/Sex: January 07, 1956 (59 y.o. Female) Treating RN: Montey Hora Primary Care Physician: [name] Other Clinician: Referring Physician: [name] Treating Physician/Extender: Frann Rider in Treatment: 8 Visit Information History Since Last Visit Added or deleted any medications: No Patient Arrived: Wheel Chair Any new allergies or adverse reactions: No Arrival Time: 11:35 Had a fall or experienced change in No Accompanied By: self activities of daily living that may affect Transfer Assistance: None risk of falls: Patient Identification Verified: Yes Signs or symptoms of abuse/neglect since last No Secondary Verification Process Yes visito Completed: Hospitalized since last visit: No Patient Requires Transmission- No Pain Present Now: No Based Precautions: Patient Has Alerts: Yes Patient Alerts: Patient on Blood Thinner Electronic Signature(s) Signed: 04/07/2015 5:00:37 PM By: Montey Hora Entered By: Montey Hora on 04/07/2015 11:36:16 [name] ([id number]) -------------------------------------------------------------------------------- Clinic Level of Care Assessment Details Patient Name: [name]. Date of Service: 04/07/2015 11:00 AM Medical Record Number: [id number] Patient Account Number: [id number] Date of Birth/Sex: [birth year] (59 y.o. Female) Treating RN: Cornell Barman Primary Care Physician: [name] Other Clinician: Referring Physician: [name] Treating Physician/Extender: Frann Rider in Treatment: 105 Clinic Level of Care Assessment Items TOOL 4 Quantity Score []  - Use when only an EandM is performed on FOLLOW-UP visit 0 ASSESSMENTS - Nursing Assessment /  Reassessment []  - Reassessment of Co-morbidities (includes updates in patient status) 0 X - Reassessment of Adherence to Treatment Plan 1 5 ASSESSMENTS - Wound and Skin Assessment / Reassessment []  - Simple Wound Assessment / Reassessment - one wound 0 X - Complex Wound Assessment / Reassessment - multiple wounds 1 5 []  - Dermatologic / Skin Assessment (not related to wound area) 0 ASSESSMENTS - Focused Assessment []  - Circumferential Edema Measurements - multi extremities 0 []  - Nutritional Assessment / Counseling / Intervention 0 []  - Lower Extremity Assessment (monofilament, tuning fork, pulses) 0 []  - Peripheral Arterial Disease Assessment (using hand held doppler) 0 ASSESSMENTS - Ostomy and/or Continence Assessment and Care []  - Incontinence Assessment and Management 0 []  - Ostomy Care Assessment and Management (repouching, etc.) 0 PROCESS - Coordination of Care []  - Simple Patient / Family Education for ongoing care 0 X - Complex (extensive) Patient / Family Education for ongoing care 1 20 X - Staff obtains Programmer, systems, Records, Test Results / Process Orders 1 10 []  - Staff telephones HHA, Nursing Homes / Clarify orders / etc 0 []  - Routine Transfer to another Facility (non-emergent condition) 0 Echeverri, Deija K. ([id number]) []  - Routine Hospital Admission (non-emergent condition) 0 []  - New Admissions / Biomedical engineer / Ordering NPWT, Apligraf, etc. 0 []  - Emergency Hospital Admission (emergent condition) 0 X - Simple Discharge Coordination 1 10 []  - Complex (extensive) Discharge Coordination 0 PROCESS - Special Needs []  - Pediatric / Minor Patient Management 0 []  - Isolation Patient Management 0 []  - Hearing / Language / Visual special needs 0 []  - Assessment of Community assistance (transportation, D/C planning, etc.) 0 []  - Additional assistance / Altered mentation 0 []  - Support Surface(s) Assessment (bed, cushion, seat, etc.) 0 INTERVENTIONS - Wound Cleansing /  Measurement []  - Simple Wound Cleansing - one wound 0 X - Complex Wound Cleansing - multiple wounds 1 5 X - Wound Imaging (photographs - any number of wounds) 1 5 []  -  Wound Tracing (instead of photographs) 0 []  - Simple Wound Measurement - one wound 0 X - Complex Wound Measurement - multiple wounds 1 5 INTERVENTIONS - Wound Dressings []  - Small Wound Dressing one or multiple wounds 0 X - Medium Wound Dressing one or multiple wounds 1 15 []  - Large Wound Dressing one or multiple wounds 0 []  - Application of Medications - topical 0 []  - Application of Medications - injection 0 INTERVENTIONS - Miscellaneous []  - External ear exam 0 Arrellano, Ruthy K. ([id number]) []  - Specimen Collection (cultures, biopsies, blood, body fluids, etc.) 0 []  - Specimen(s) / Culture(s) sent or taken to Lab for analysis 0 []  - Patient Transfer (multiple staff / Harrel Lemon Lift / Similar devices) 0 []  - Simple Staple / Suture removal (25 or less) 0 []  - Complex Staple / Suture removal (26 or more) 0 []  - Hypo / Hyperglycemic Management (close monitor of Blood Glucose) 0 []  - Ankle / Brachial Index (ABI) - do not check if billed separately 0 X - Vital Signs 1 5 Has the patient been seen at the hospital within the last three years: Yes Total Score: 85 Level Of Care: New/Established - Level 3 Electronic Signature(s) Signed: 04/07/2015 5:42:51 PM By: Gretta Cool, RN, BSN, Kim RN, BSN Entered By: Gretta Cool, RN, BSN, Kim on 04/07/2015 17:31:55 [name] ([id number]) -------------------------------------------------------------------------------- Encounter Discharge Information Details Patient Name: [name]. Date of Service: 04/07/2015 11:00 AM Medical Record Number: [id number] Patient Account Number: [id number] Date of Birth/Sex: March 19, 1956 (59 y.o. Female) Treating RN: Montey Hora Primary Care Physician: [name] Other Clinician: Referring Physician: [name] Treating Physician/Extender:  Frann Rider in Treatment: 105 Encounter Discharge Information Items Discharge Pain Level: 0 Discharge Condition: Stable Ambulatory Status: Wheelchair Discharge Destination: Home Transportation: Private Auto Accompanied By: self Schedule Follow-up Appointment: Yes Medication Reconciliation completed and provided to Patient/Care No Tajae Maiolo: Provided on Clinical Summary of Care: 04/07/2015 Form Type Recipient Paper Patient GI Electronic Signature(s) Signed: 04/07/2015 12:54:58 PM By: Montey Hora Previous Signature: 04/07/2015 12:32:00 PM Version By: Ruthine Dose Entered By: Montey Hora on 04/07/2015 12:54:58 [name] ([id number]) -------------------------------------------------------------------------------- Lower Extremity Assessment Details Patient Name: [name]. Date of Service: 04/07/2015 11:00 AM Medical Record Number: [id number] Patient Account Number: [id number] Date of Birth/Sex: 11/11/1955 (59 y.o. Female) Treating RN: Montey Hora Primary Care Physician: [name] Other Clinician: Referring Physician: [name] Treating Physician/Extender: Frann Rider in Treatment: 105 Edema Assessment Assessed: Shirlyn Goltz: No] Patrice Paradise: No] Edema: [Left: Yes] [Right: Yes] Calf Left: Right: Point of Measurement: 24 cm From Medial Instep 51.5 cm 48 cm Ankle Left: Right: Point of Measurement: 8 cm From Medial Instep 26.5 cm 26.3 cm Vascular Assessment Pulses: Posterior Tibial Dorsalis Pedis Palpable: [Left:Yes] [Right:Yes] Extremity colors, hair growth, and conditions: Extremity Color: [Left:Hyperpigmented] [Right:Hyperpigmented] Hair Growth on Extremity: [Left:No] [Right:No] Temperature of Extremity: [Left:Warm] [Right:Warm] Capillary Refill: [Left:< 3 seconds] [Right:< 3 seconds] Toe Nail Assessment Left: Right: Thick: Yes Yes Discolored: Yes Yes Deformed: Yes Yes Improper Length and Hygiene: No No Electronic  Signature(s) Signed: 04/07/2015 5:00:37 PM By: Montey Hora Entered By: Montey Hora on 04/07/2015 11:51:29 [name] ([id number]) -------------------------------------------------------------------------------- Multi Wound Chart Details Patient Name: [name]. Date of Service: 04/07/2015 11:00 AM Medical Record Number: [id number] Patient Account Number: [id number] Date of Birth/Sex: [birth year] (59 y.o. Female) Treating RN: Montey Hora Primary Care Physician: [name] Other Clinician: Referring Physician: [name] Treating Physician/Extender: Frann Rider in Treatment: 105 Vital Signs Height(in): 65 Pulse(bpm): 26  Weight(lbs): Blood Pressure 182/59 (mmHg): Body Mass Index(BMI): Temperature(F): 98.2 Respiratory Rate 18 (breaths/min): Photos: [53:No Photos] [54:No Photos] [N/A:N/A] Wound Location: [53:Right Lower Leg - Circumfernential] [54:Left Lower Leg - Circumfernential] [N/A:N/A] Wounding Event: [53:Gradually Appeared] [54:Gradually Appeared] [N/A:N/A] Primary Etiology: [53:Lymphedema] [54:Lymphedema] [N/A:N/A] Comorbid History: [53:Lymphedema, Hypertension, Type II Diabetes] [54:Lymphedema, Hypertension, Type II Diabetes] [N/A:N/A] Date Acquired: [53:02/21/2015] [54:03/26/2015] [N/A:N/A] Weeks of Treatment: [53:5] [54:0] [N/A:N/A] Wound Status: [53:Open] [54:Open] [N/A:N/A] Clustered Wound: [53:Yes] [54:Yes] [N/A:N/A] Measurements L x W x D 10.5x27x0.1 [54:16.5x43.5x0.1] [N/A:N/A] (cm) Area (cm) : [53:222.66] [id number] [N/A:N/A] Volume (cm) : [53:22.266] [54:56.372] [N/A:N/A] % Reduction in Area: [53:37.70%] [54:-478.80%] [N/A:N/A] % Reduction in Volume: 37.70% [54:-478.80%] [N/A:N/A] Classification: [53:Partial Thickness] [54:Partial Thickness] [N/A:N/A] HBO Classification: [53:Grade 1] [54:Grade 1] [N/A:N/A] Exudate Amount: [53:Large] [54:Small] [N/A:N/A] Exudate Type: [53:Serous] [54:Serous] [N/A:N/A] Exudate Color:  [53:amber] [54:amber] [N/A:N/A] Wound Margin: [53:Indistinct, nonvisible] [54:Indistinct, nonvisible] [N/A:N/A] Granulation Amount: [53:Large (67-100%)] [54:Large (67-100%)] [N/A:N/A] Granulation Quality: [53:Red] [54:Red] [N/A:N/A] Necrotic Amount: [53:None Present (0%)] [54:None Present (0%)] [N/A:N/A] Exposed Structures: [53:Fascia: No Fat: No Tendon: No] [54:Fascia: No Fat: No Tendon: No] [N/A:N/A] Muscle: No Muscle: No Joint: No Joint: No Bone: No Bone: No Limited to Skin Limited to Skin Breakdown Breakdown Epithelialization: None None N/A Periwound Skin Texture: Scarring: Yes Edema: No N/A Edema: No Excoriation: No Excoriation: No Induration: No Induration: No Callus: No Callus: No Crepitus: No Crepitus: No Fluctuance: No Fluctuance: No Friable: No Friable: No Rash: No Rash: No Scarring: No Periwound Skin Moist: Yes Dry/Scaly: Yes N/A Moisture: Maceration: No Maceration: No Dry/Scaly: No Moist: No Periwound Skin Color: Hemosiderin Staining: Yes Hemosiderin Staining: Yes N/A Atrophie Blanche: No Atrophie Blanche: No Cyanosis: No Cyanosis: No Ecchymosis: No Ecchymosis: No Erythema: No Erythema: No Mottled: No Mottled: No Pallor: No Pallor: No Rubor: No Rubor: No Temperature: N/A No Abnormality N/A Tenderness on No No N/A Palpation: Wound Preparation: Ulcer Cleansing: Other: Ulcer Cleansing: Other: N/A soap and water soap and water Topical Anesthetic Topical Anesthetic Applied: None Applied: None Treatment Notes Electronic Signature(s) Signed: 04/07/2015 5:00:37 PM By: Montey Hora Entered By: Montey Hora on 04/07/2015 11:56:50 [name] ([id number]) -------------------------------------------------------------------------------- Multi-Disciplinary Care Plan Details Patient Name: [name]. Date of Service: 04/07/2015 11:00 AM Medical Record Number: [id number] Patient Account Number: [id number] Date of Birth/Sex:  [birth year] (59 y.o. Female) Treating RN: Montey Hora Primary Care Physician: [name] Other Clinician: Referring Physician: [name] Treating Physician/Extender: Frann Rider in Treatment: 105 Active Inactive Electronic Signature(s) Signed: 04/07/2015 5:00:37 PM By: Montey Hora Entered By: Montey Hora on 04/07/2015 11:56:40 [name] ([id number]) -------------------------------------------------------------------------------- Patient/Caregiver Education Details Patient Name: [name] Date of Service: 04/07/2015 11:00 AM Medical Record Number: [id number] Patient Account Number: [id number] Date of Birth/Gender: 1955/08/30 (59 y.o. Female) Treating RN: Montey Hora Primary Care Physician: [name] Other Clinician: Referring Physician: [name] Treating Physician/Extender: Frann Rider in Treatment: 105 Education Assessment Education Provided To: Patient Education Topics Provided Venous: Other: use compression pumps for 1 hour twice daily and wear juxtalite from morning to Handouts: bedtime every day ev Methods: Explain/Verbal Responses: State content correctly Electronic Signature(s) Signed: 04/07/2015 12:55:52 PM By: Montey Hora Entered By: Montey Hora on 04/07/2015 12:55:51 [name] ([id number]) -------------------------------------------------------------------------------- Wound Assessment Details Patient Name: [name]. Date of Service: 04/07/2015 11:00 AM Medical Record Number: [id number] Patient Account Number: [id number] Date of Birth/Sex: [birth year] (59 y.o. Female) Treating RN: Montey Hora Primary Care Physician: [name] Other Clinician: Referring Physician: [name] Treating Physician/Extender: Frann Rider in  Treatment: 105 Wound Status Wound Number: 53 Primary Lymphedema Etiology: Wound Location: Right Lower Leg - Circumfernential Wound Status:  Open Wounding Event: Gradually Appeared Comorbid Lymphedema, Hypertension, Type II History: Diabetes Date Acquired: 02/21/2015 Weeks Of Treatment: 5 Clustered Wound: Yes Photos Wound Measurements Length: (cm) 10.5 Width: (cm) 27 Depth: (cm) 0.1 Area: (cm) 222.66 Volume: (cm) 22.266 % Reduction in Area: 37.7% % Reduction in Volume: 37.7% Epithelialization: None Tunneling: No Undermining: No Wound Description Classification: Partial Thickness Diabetic Severity (Wagner): Grade 1 Wound Margin: Indistinct, nonvisible Exudate Amount: Large Exudate Type: Serous Exudate Color: amber Wound Bed Granulation Amount: Large (67-100%) Exposed Structure Granulation Quality: Red Fascia Exposed: No Necrotic Amount: None Present (0%) Fat Layer Exposed: No Cuda, Chayce K. ([id number]) Tendon Exposed: No Muscle Exposed: No Joint Exposed: No Bone Exposed: No Limited to Skin Breakdown Periwound Skin Texture Texture Color No Abnormalities Noted: No No Abnormalities Noted: No Callus: No Atrophie Blanche: No Crepitus: No Cyanosis: No Excoriation: No Ecchymosis: No Fluctuance: No Erythema: No Friable: No Hemosiderin Staining: Yes Induration: No Mottled: No Localized Edema: No Pallor: No Rash: No Rubor: No Scarring: Yes Moisture No Abnormalities Noted: No Dry / Scaly: No Maceration: No Moist: Yes Wound Preparation Ulcer Cleansing: Other: soap and water, Topical Anesthetic Applied: None Treatment Notes Wound #53 (Right, Circumferential Lower Leg) 1. Cleansed with: Cleanse wound with antibacterial soap and water 4. Dressing Applied: Aquacel Ag Other dressing (specify in notes) 5. Secondary Dressing Applied ABD and Kerlix/Conform 7. Secured with Tape Notes Manufacturing systems engineer) Signed: 04/07/2015 12:57:56 PM By: Montey Hora Entered By: Montey Hora on 04/07/2015 12:57:56 [name] ([name]  ([id number]) -------------------------------------------------------------------------------- Wound Assessment Details Patient Name: [name]. Date of Service: 04/07/2015 11:00 AM Medical Record Number: [id number] Patient Account Number: [id number] Date of Birth/Sex: Nov 16, 1955 (59 y.o. Female) Treating RN: Montey Hora Primary Care Physician: [name] Other Clinician: Referring Physician: [name] Treating Physician/Extender: Frann Rider in Treatment: 105 Wound Status Wound Number: 54 Primary Lymphedema Etiology: Wound Location: Left Lower Leg - Circumfernential Wound Status: Open Wounding Event: Gradually Appeared Comorbid Lymphedema, Hypertension, Type II History: Diabetes Date Acquired: 03/26/2015 Weeks Of Treatment: 0 Clustered Wound: Yes Photos Wound Measurements Length: (cm) 16.5 Width: (cm) 43.5 Depth: (cm) 0.1 Area: (cm) 563.72 Volume: (cm) 56.372 % Reduction in Area: -478.8% % Reduction in Volume: -478.8% Epithelialization: None Tunneling: No Undermining: No Wound Description Classification: Partial Thickness Diabetic Severity (Wagner): Grade 1 Wound Margin: Indistinct, nonvisible Exudate Amount: Small Exudate Type: Serous Exudate Color: amber Wound Bed Granulation Amount: Large (67-100%) Exposed Structure Granulation Quality: Red Fascia Exposed: No Necrotic Amount: None Present (0%) Fat Layer Exposed: No Cavins, Mahasin K. ([id number]) Tendon Exposed: No Muscle Exposed: No Joint Exposed: No Bone Exposed: No Limited to Skin Breakdown Periwound Skin Texture Texture Color No Abnormalities Noted: No No Abnormalities Noted: No Callus: No Atrophie Blanche: No Crepitus: No Cyanosis: No Excoriation: No Ecchymosis: No Fluctuance: No Erythema: No Friable: No Hemosiderin Staining: Yes Induration: No Mottled: No Localized Edema: No Pallor: No Rash: No Rubor: No Scarring: No Temperature / Pain Moisture Temperature:  No Abnormality No Abnormalities Noted: No Dry / Scaly: Yes Maceration: No Moist: No Wound Preparation Ulcer Cleansing: Other: soap and water, Topical Anesthetic Applied: None Treatment Notes Wound #54 (Left, Circumferential Lower Leg) 1. Cleansed with: Cleanse wound with antibacterial soap and water 4. Dressing Applied: Aquacel Ag Other dressing (specify in notes) 5. Secondary Dressing Applied ABD and Kerlix/Conform 7. Secured with Tape Notes Manufacturing systems engineer)  Signed: 04/07/2015 12:59:08 PM By: Montey Hora Previous Signature: 04/07/2015 12:58:33 PM Version By: Montey Hora Entered By: Montey Hora on 04/07/2015 12:59:08 [name] ([name], [name] ([id number]) -------------------------------------------------------------------------------- Vitals Details Patient Name: [name] Date of Service: 04/07/2015 11:00 AM Medical Record Number: [id number] Patient Account Number: [id number] Date of Birth/Sex: 13-Jul-1955 (59 y.o. Female) Treating RN: Montey Hora Primary Care Physician: [name] Other Clinician: Referring Physician: [name] Treating Physician/Extender: Frann Rider in Treatment: 105 Vital Signs Time Taken: 11:36 Temperature (F): 98.2 Height (in): 65 Pulse (bpm): 66 Respiratory Rate (breaths/min): 18 Blood Pressure (mmHg): 182/59 Reference Range: 80 - 120 mg / dl Electronic Signature(s) Signed: 04/07/2015 5:00:37 PM By: Montey Hora Entered By: Montey Hora on 04/07/2015 11:38:33

## 2015-04-10 NOTE — Pre-Procedure Instructions (Addendum)
Spoke with [name] at Dr. Rudene Christians office regarding how clearances are going for pt, pt is now being referred to a urologist for abnormal lab results, new diagnosis of kidney disease.  [name] will let us know when pt is cleared and ready for surgery.

## 2015-04-14 ENCOUNTER — Encounter: Payer: Managed Care, Other (non HMO) | Admitting: Surgery

## 2015-04-14 DIAGNOSIS — L97211 Non-pressure chronic ulcer of right calf limited to breakdown of skin: Secondary | ICD-10-CM | POA: Diagnosis not present

## 2015-04-14 NOTE — Progress Notes (Addendum)
Autumn, [name] ([id number]) Visit Report for 04/14/2015 Arrival Information Details Patient Name: [name], [name]. Date of Service: 04/14/2015 2:30 PM Medical Record Number: [id number] Patient Account Number: [id number] Date of Birth/Sex: 1956-01-20 (60 y.o. Female) Treating RN: Afful, RN, BSN, [name] Primary Care Physician: [name] Other Clinician: Referring Physician: Otilio [name] Treating Physician/Extender: [name] in Treatment: 106 Visit Information History Since Last Visit Added or deleted any medications: No Patient Arrived: Wheel Chair Any new allergies or adverse reactions: No Arrival Time: 14:32 Had a fall or experienced change in No Accompanied By: self activities of daily living that may affect Transfer Assistance: Manual risk of falls: Patient Identification Verified: Yes Signs or symptoms of abuse/neglect since last No Secondary Verification Process Yes visito Completed: Has Dressing in Place as Prescribed: Yes Patient Requires Transmission- No Has Compression in Place as Prescribed: Yes Based Precautions: Pain Present Now: No Patient Has Alerts: Yes Patient Alerts: Patient on Blood Thinner Electronic Signature(s) Signed: 04/15/2015 1:44:27 PM By: [name] BSN, RN Previous Signature: 04/14/2015 2:33:06 PM Version By: [name] BSN, RN Entered By: [name] on 04/15/2015 13:44:27 [name] ([id number]) -------------------------------------------------------------------------------- Encounter Discharge Information Details Patient Name: [name]. Date of Service: 04/14/2015 2:30 PM Medical Record Number: [id number] Patient Account Number: [id number] Date of Birth/Sex: Jul 11, 1955 (59 y.o. Female) Treating RN: Autumn Gouty, RN, BSN, [name] Primary Care Physician: [name] Other Clinician: Referring Physician: Otilio [name] Treating Physician/Extender: [name] in Treatment: 106 Encounter Discharge  Information Items Discharge Pain Level: 0 Discharge Condition: Stable Ambulatory Status: Cane Discharge Destination: Home Transportation: Private Auto Accompanied By: friend Schedule Follow-up Appointment: No Medication Reconciliation completed and provided to Patient/Care No [name]: Provided on Clinical Summary of Care: 04/14/2015 Form Type Recipient Paper Patient GI Electronic Signature(s) Signed: 04/14/2015 3:37:45 PM By: [name] Previous Signature: 04/14/2015 3:07:21 PM Version By: [name] BSN, RN Entered By: [name] on 04/14/2015 15:37:45 [name] ([id number]) -------------------------------------------------------------------------------- Lower Extremity Assessment Details Patient Name: [name] Date of Service: 04/14/2015 2:30 PM Medical Record Number: [id number] Patient Account Number: [id number] Date of Birth/Sex: 03/01/1956 (59 y.o. Female) Treating RN: Afful, RN, BSN, [name] Primary Care Physician: [name] Other Clinician: Referring Physician: Otilio [name] Treating Physician/Extender: [name] in Treatment: 106 Edema Assessment Assessed: [name]: No] [Right: No] E[Left: dema] [Right: :] Calf Left: Right: Point of Measurement: 24 cm From Medial Instep 53.5 cm 48.9 cm Ankle Left: Right: Point of Measurement: 8 cm From Medial Instep 27.2 cm 27.8 cm Vascular Assessment Claudication: Claudication Assessment [Left:None] [Right:None] Pulses: Posterior Tibial Dorsalis Pedis Palpable: [Left:Yes] [Right:Yes] Extremity colors, hair growth, and conditions: Extremity Color: [Left:Mottled] [Right:Mottled] Hair Growth on Extremity: [Left:No] [Right:No] Temperature of Extremity: [Left:Warm] [Right:Warm] Capillary Refill: [Left:< 3 seconds] Toe Nail Assessment Left: Right: Thick: Yes Yes Discolored: Yes Yes Deformed: No No Improper Length and Hygiene: Yes Yes Electronic Signature(s) Signed: 04/14/2015 2:41:40 PM  By: [name] BSN, RN Entered By: [name] on 04/14/2015 14:41:40 [name] ([id number] [name], [name] ([id number]) -------------------------------------------------------------------------------- Multi Wound Chart Details Patient Name: [name]. Date of Service: 04/14/2015 2:30 PM Medical Record Number: [id number] Patient Account Number: [id number] Date of Birth/Sex: Aug 11, 1955 (59 y.o. Female) Treating RN: Autumn Gouty, RN, BSN, [name] Primary Care Physician: [name] Other Clinician: Referring Physician: Otilio [name] Treating Physician/Extender: [name] in Treatment: 106 Vital Signs Height(in): 65 Pulse(bpm): 68 Weight(lbs): Blood Pressure 178/66 (mmHg): Body Mass Index(BMI): Temperature(F): 98.1 Respiratory Rate 20 (breaths/min): Photos: [53:No Photos] [54:No Photos] [  N/A:N/A] Wound Location: [53:Right, Circumferential Lower Leg] [54:Left, Circumferential Lower Leg] [N/A:N/A] Wounding Event: [53:Gradually Appeared] [54:Gradually Appeared] [N/A:N/A] Primary Etiology: [53:Lymphedema] [54:Lymphedema] [N/A:N/A] Date Acquired: [53:02/21/2015] [54:03/26/2015] [N/A:N/A] Weeks of Treatment: [53:6] [54:1] [N/A:N/A] Wound Status: [53:Open] [54:Open] [N/A:N/A] Clustered Wound: [53:Yes] [54:Yes] [N/A:N/A] Measurements L x W x D 15.5x29x0.1 [54:8x20x0.1] [N/A:N/A] (cm) Area (cm) : [id number] [54:125.664] [N/A:N/A] Volume (cm) : [id number] [54:12.566] [N/A:N/A] % Reduction in Area: [53:1.20%] [54:-29.00%] [N/A:N/A] % Reduction in Volume: 1.20% [54:-29.00%] [N/A:N/A] Classification: [53:Partial Thickness] [54:Partial Thickness] [N/A:N/A] Periwound Skin Texture: No Abnormalities Noted [54:No Abnormalities Noted] [N/A:N/A] Periwound Skin [53:No Abnormalities Noted] [54:No Abnormalities Noted] [N/A:N/A] Moisture: Periwound Skin Color: No Abnormalities Noted [54:No Abnormalities Noted] [N/A:N/A] Tenderness on [53:No] [54:No]  [N/A:N/A] Treatment Notes Electronic Signature(s) Signed: 04/14/2015 3:04:13 PM By: [name] BSN, RN Entered By: [name] on 04/14/2015 15:04:12 [name] ([name] ([id number]) -------------------------------------------------------------------------------- Multi-Disciplinary Care Plan Details Patient Name: [name]. Date of Service: 04/14/2015 2:30 PM Medical Record Number: [id number] Patient Account Number: [id number] Date of Birth/Sex: Apr 28, 1956 (59 y.o. Female) Treating RN: Afful, RN, BSN, [name] Primary Care Physician: [name] Other Clinician: Referring Physician: Otilio [name] Treating Physician/Extender: [name] in Treatment: 106 Active Inactive Electronic Signature(s) Signed: 04/14/2015 3:03:56 PM By: [name] BSN, RN Entered By: [name] on 04/14/2015 15:03:56 [name] ([id number]) -------------------------------------------------------------------------------- Pain Assessment Details Patient Name: [name]. Date of Service: 04/14/2015 2:30 PM Medical Record Number: [id number] Patient Account Number: [id number] Date of Birth/Sex: 07-13-1955 (59 y.o. Female) Treating RN: Autumn Gouty, RN, BSN, [name] Primary Care Physician: [name] Other Clinician: Referring Physician: Otilio [name] Treating Physician/Extender: [name] in Treatment: 106 Active Problems Location of Pain Severity and Description of Pain Patient Has Paino No Site Locations Pain Management and Medication Current Pain Management: Electronic Signature(s) Signed: 04/14/2015 2:33:14 PM By: [name] BSN, RN Entered By: [name] on 04/14/2015 14:33:14 [name] ([id number]) -------------------------------------------------------------------------------- Patient/Caregiver Education Details Patient Name: [name] Date of Service: 04/14/2015 2:30 PM Medical Record Number:  [id number] Patient Account Number: [id number] Date of Birth/Gender: 02-20-1956 (59 y.o. Female) Treating RN: Autumn Gouty, RN, BSN, [name] Primary Care Physician: [name] Other Clinician: Referring Physician: Otilio [name] Treating Physician/Extender: [name] in Treatment: 106 Education Assessment Education Provided To: Patient Education Topics Provided Basic Hygiene: Methods: Explain/Verbal Responses: State content correctly Wound/Skin Impairment: Methods: Explain/Verbal Responses: State content correctly Electronic Signature(s) Signed: 04/14/2015 3:07:34 PM By: [name] BSN, RN Entered By: [name] on 04/14/2015 15:07:34 [name] ([id number]) -------------------------------------------------------------------------------- Wound Assessment Details Patient Name: [name]. Date of Service: 04/14/2015 2:30 PM Medical Record Number: [id number] Patient Account Number: [id number] Date of Birth/Sex: 12-18-1955 (59 y.o. Female) Treating RN: Afful, RN, BSN, [place] Primary Care Physician: [name] Other Clinician: Referring Physician: Otilio [name] Treating Physician/Extender: [name] in Treatment: 106 Wound Status Wound Number: [id number] Primary Etiology: Lymphedema Wound Location: Right, Circumferential Lower Wound Status: Open Leg Wounding Event: Gradually Appeared Date Acquired: 02/21/2015 Weeks Of Treatment: 6 Clustered Wound: Yes Photos Photo Uploaded By: [name] on 04/14/2015 16:53:22 Wound Measurements Length: (cm) 15.5 Width: (cm) 29 Depth: (cm) 0.1 Area: (cm) 353.036 Volume: (cm) 35.304 % Reduction in Area: 1.2% % Reduction in Volume: 1.2% Wound Description Classification: Partial Thickness Periwound Skin Texture Texture Color No Abnormalities Noted: No No Abnormalities Noted: No Moisture No Abnormalities Noted: No Treatment Notes Wound #53 (Right, Circumferential Lower Leg) Flori, Anistyn K.  ([id number]) 1. Cleansed with: Cleanse wound with antibacterial soap  and water 3. Peri-wound Care: Barrier cream 4. Dressing Applied: Aquacel Ag 5. Secondary Dressing Applied ABD and Kerlix/Conform 7. Secured with 4 Layer Compression System - Bilateral Electronic Signature(s) Signed: 04/14/2015 4:25:08 PM By: [name] BSN, RN Entered By: [name] on 04/14/2015 14:45:59 [name] ([id number]) -------------------------------------------------------------------------------- Wound Assessment Details Patient Name: [name]. Date of Service: 04/14/2015 2:30 PM Medical Record Number: [id number] Patient Account Number: [id number] Date of Birth/Sex: [birth year] (59 y.o. Female) Treating RN: Afful, RN, BSN, [place] Primary Care Physician: [name] Other Clinician: Referring Physician: Otilio [name] Treating Physician/Extender: [name] in Treatment: 106 Wound Status Wound Number: 71 Primary Etiology: Lymphedema Wound Location: Left, Circumferential Lower Leg Wound Status: Open Wounding Event: Gradually Appeared Date Acquired: 03/26/2015 Weeks Of Treatment: 1 Clustered Wound: Yes Photos Photo Uploaded By: [name] on 04/14/2015 16:53:59 Wound Measurements Length: (cm) 8 Width: (cm) 20 Depth: (cm) 0.1 Area: (cm) 125.664 Volume: (cm) 12.566 % Reduction in Area: -29% % Reduction in Volume: -29% Wound Description Classification: Partial Thickness Periwound Skin Texture Texture Color No Abnormalities Noted: No No Abnormalities Noted: No Moisture No Abnormalities Noted: No Treatment Notes Wound #54 (Left, Circumferential Lower Leg) 1. Cleansed withIYONNA, LOWNEY ([id number]) Cleanse wound with antibacterial soap and water 3. Peri-wound Care: Barrier cream 4. Dressing Applied: Aquacel Ag 5. Secondary Dressing Applied ABD and Kerlix/Conform 7. Secured with 4 Layer Compression System - Bilateral Electronic  Signature(s) Signed: 04/14/2015 4:25:08 PM By: [name] BSN, RN Entered By: [name] on 04/14/2015 14:46:00 [name] ([id number]) -------------------------------------------------------------------------------- Vitals Details Patient Name: [name] Date of Service: 04/14/2015 2:30 PM Medical Record Number: [id number] Patient Account Number: [id number] Date of Birth/Sex: [birth year] (59 y.o. Female) Treating RN: Afful, RN, BSN, [place] Primary Care Physician: [name] Other Clinician: Referring Physician: Otilio [name] Treating Physician/Extender: [name] in Treatment: 106 Vital Signs Time Taken: 14:40 Temperature (F): 98.1 Height (in): 65 Pulse (bpm): 68 Respiratory Rate (breaths/min): 20 Blood Pressure (mmHg): 178/66 Reference Range: 80 - 120 mg / dl Electronic Signature(s) Signed: 04/14/2015 2:40:48 PM By: [name] BSN, RN Entered By: [name] on 04/14/2015 14:40:48

## 2015-04-15 ENCOUNTER — Ambulatory Visit: Payer: Managed Care, Other (non HMO)

## 2015-04-15 NOTE — Progress Notes (Signed)
TOPAZ, ELDRETH ([id number]) Visit Report for 04/14/2015 Chief Complaint Document Details Patient Name: [name], [name]. Date of Service: 04/14/2015 2:30 PM Medical Record Number: [id number] Patient Account Number: [id number] Date of Birth/Sex: [birth year] (59 y.o. Female) Treating RN: Baruch Gouty, RN, BSN, Velva Harman Primary Care Physician: [name] Other Clinician: Referring Physician: [name] Treating Physician/Extender: Frann Rider in Treatment: 106 Information Obtained from: Patient Chief Complaint Bilateral lower extremity phlebolymphedema and recurrent bilateral calf ulcerations. Electronic Signature(s) Signed: 04/14/2015 3:24:58 PM By: Christin Fudge MD, FACS Entered By: Christin Fudge on 04/14/2015 15:24:58 [name] ([id number]) -------------------------------------------------------------------------------- HPI Details Patient Name: [name] Date of Service: 04/14/2015 2:30 PM Medical Record Number: [id number] Patient Account Number: [id number] Date of Birth/Sex: 1955/08/18 (59 y.o. Female) Treating RN: Baruch Gouty, RN, BSN, Velva Harman Primary Care Physician: [name] Other Clinician: Referring Physician: [name] Treating Physician/Extender: Frann Rider in Treatment: 106 History of Present Illness HPI Description: 59yo w/ h/o BLE phlebolymphedema, type 2 DM, and obesity. No PVD. h/o chronic, recurrent BLE calf ulcers. Treated with 4 layer compression. Infrequently uses lymphedema pump. Bilateral lower extremity ulcerations healed as of June 2016. Fitted for custom compression stockings but not yet received. Awaiting lymphedema consult. She was transitioned to juxtalite compression garment on the left. Compression bandage applied to right leg on 02/25/2015. Patient has not followed up since that time, wearing same compression bandage. She returns to clinic today and has developed recurrent bilateral calf ulcerations. No significant  pain. No fever or chills. Moderate drainage. 04/07/2015 -- her hip surgery was canceled due to medical reasons and she is here without any compression today. I understand from Dr. Quay Burow note that she has been very noncompliant in the last time did not come back for over a month and had the same compression bandage. Today she does not have any compression and she says they were wet and hence she didn't wear them. 04/14/2015 -- she says she has got some transport organizer and will be here every week for regular appointments. She has been using her juxta lights at home. Electronic Signature(s) Signed: 04/14/2015 3:25:30 PM By: Christin Fudge MD, FACS Entered By: Christin Fudge on 04/14/2015 15:25:30 [name] ([id number]) -------------------------------------------------------------------------------- Physical Exam Details Patient Name: [name] Date of Service: 04/14/2015 2:30 PM Medical Record Number: [id number] Patient Account Number: [id number] Date of Birth/Sex: 12/08/1955 (59 y.o. Female) Treating RN: Baruch Gouty, RN, BSN, Velva Harman Primary Care Physician: [name] Other Clinician: Referring Physician: [name] Treating Physician/Extender: Frann Rider in Treatment: 106 Constitutional . Pulse regular. Respirations normal and unlabored. Afebrile. . Eyes Nonicteric. Reactive to light. Ears, Nose, Mouth, and Throat Lips, teeth, and gums WNL.Marland Kitchen Moist mucosa without lesions . Neck supple and nontender. No palpable supraclavicular or cervical adenopathy. Normal sized without goiter. Respiratory WNL. No retractions.. Cardiovascular Pedal Pulses WNL. progressive stage II lymphedema of both lower extremities. Lymphatic No adneopathy. No adenopathy. No adenopathy. Musculoskeletal Adexa without tenderness or enlargement.. Digits and nails w/o clubbing, cyanosis, infection, petechiae, ischemia, or inflammatory conditions.. Integumentary (Hair, Skin) No suspicious  lesions. No crepitus or fluctuance. No peri-wound warmth or erythema. No masses.Marland Kitchen Psychiatric Judgement and insight Intact.. No evidence of depression, anxiety, or agitation.. Notes she continues to have massive lymphedema both lower extremities and also has ulceration bilateral lower extremities which are fairly clean. Electronic Signature(s) Signed: 04/14/2015 3:26:13 PM By: Christin Fudge MD, FACS Entered By: Christin Fudge on 04/14/2015 15:26:13 [name] ([id number]) -------------------------------------------------------------------------------- Physician Orders Details Patient Name: [name]  Date of Service: 04/14/2015 2:30 PM Medical Record Number: [id number] Patient Account Number: [id number] Date of Birth/Sex: February 01, 1956 (59 y.o. Female) Treating RN: Baruch Gouty, RN, BSN, [place] Primary Care Physician: [name] Other Clinician: Referring Physician: [name] Treating Physician/Extender: Frann Rider in Treatment: [id number] Verbal / Phone Orders: Yes Clinician: Afful, RN, BSN, Rita Read Back and Verified: Yes Diagnosis Coding Wound Cleansing Wound #53 Right,Circumferential Lower Leg o Cleanse wound with mild soap and water Wound #54 Left,Circumferential Lower Leg o Cleanse wound with mild soap and water Skin Barriers/Peri-Wound Care Wound #53 Right,Circumferential Lower Leg o Barrier cream Wound #54 Left,Circumferential Lower Leg o Barrier cream Primary Wound Dressing Wound #53 Right,Circumferential Lower Leg o Aquacel Ag Wound #54 Left,Circumferential Lower Leg o Aquacel Ag Secondary Dressing Wound #53 Right,Circumferential Lower Leg o ABD and Kerlix/Conform o Drawtex Wound #54 Left,Circumferential Lower Leg o ABD and Kerlix/Conform o Drawtex Dressing Change Frequency Wound #53 Right,Circumferential Lower Leg o Change dressing every other day. Wound #54 Left,Circumferential Lower Leg o Change dressing every other  day. KIELYN, DUDZIAK ([id number]) Follow-up Appointments Wound #53 Right,Circumferential Lower Leg o Return Appointment in 1 week. Wound #54 Left,Circumferential Lower Leg o Return Appointment in 1 week. Edema Control Wound #53 Right,Circumferential Lower Leg o 4 Layer Compression System - Bilateral Wound #54 Left,Circumferential Lower Leg o 4 Layer Compression System - Bilateral Electronic Signature(s) Signed: 04/14/2015 3:06:07 PM By: Regan Lemming BSN, RN Signed: 04/14/2015 4:16:00 PM By: Christin Fudge MD, FACS Entered By: Regan Lemming on 04/14/2015 15:06:06 [name] ([id number]) -------------------------------------------------------------------------------- Problem List Details Patient Name: [name]. Date of Service: 04/14/2015 2:30 PM Medical Record Number: [id number] Patient Account Number: [id number] Date of Birth/Sex: 06/03/1956 (59 y.o. Female) Treating RN: Afful, RN, BSN, [place] Primary Care Physician: [name] Other Clinician: Referring Physician: [name] Treating Physician/Extender: Frann Rider in Treatment: 106 Active Problems ICD-9 Encounter Code Description Active Date Diagnosis 457.1 Other Lymphedema - excludes congenital, eyelid or vulva 03/27/2013 Yes 459.33 Chronic venous hypertension with ulcer and inflammation 03/27/2013 Yes 707.12 Ulcer of lower limbs, except pressure ulcer; Ulcer of calf 03/27/2013 Yes ICD-10 Encounter Code Description Active Date Diagnosis I89.0 Lymphedema, not elsewhere classified 03/26/2014 Yes E66.9 Obesity, unspecified 03/26/2014 Yes E11.40 Type 2 diabetes mellitus with diabetic neuropathy, 01/14/2015 Yes unspecified R26.89 Other abnormalities of gait and mobility 01/14/2015 Yes 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 ([id number]) E11.622 Type 2 diabetes mellitus with other skin ulcer 02/25/2015 Yes Inactive Problems Resolved Problems ICD-10 Code Description Active Date Resolved Date E11.621 Type 2 diabetes mellitus with foot ulcer 10/08/2014 10/09/2014 L03.115 Cellulitis of right lower limb 02/25/2015 02/25/2015 L84 Corns and callosities 11/12/2014 11/12/2014 Electronic Signature(s) Signed: 04/14/2015 3:24:39 PM By: Christin Fudge MD, FACS Entered By: Christin Fudge on 04/14/2015 15:24:38 [name] ([id number]) -------------------------------------------------------------------------------- Progress Note Details Patient Name: [name]. Date of Service: 04/14/2015 2:30 PM Medical Record Number: [id number] Patient Account Number: [id number] Date of Birth/Sex: [birth year] (59 y.o. Female) Treating RN: Baruch Gouty, RN, BSN, Velva Harman Primary Care Physician: [name] Other Clinician: Referring Physician: [name] Treating Physician/Extender: Frann Rider in Treatment: 106 Subjective Chief Complaint Information obtained from Patient Bilateral lower extremity phlebolymphedema and recurrent bilateral calf ulcerations. History of Present Illness (HPI) 59yo w/ h/o BLE phlebolymphedema, type 2 DM, and obesity. No PVD. h/o chronic, recurrent BLE calf ulcers.  Treated with 4 layer compression. Infrequently uses lymphedema pump. Bilateral lower extremity ulcerations healed as of June 2016. Fitted for custom compression stockings but not yet received. Awaiting lymphedema consult. She was transitioned to juxtalite compression garment on the left. Compression bandage applied to right leg on 02/25/2015. Patient has not followed up since that time, wearing same compression bandage. She returns to clinic today and has developed recurrent bilateral calf ulcerations. No significant pain. No fever or chills. Moderate drainage. 04/07/2015 -- her hip surgery was canceled due to medical  reasons and she is here without any compression today. I understand from Dr. Quay Burow note that she has been very noncompliant in the last time did not come back for over a month and had the same compression bandage. Today she does not have any compression and she says they were wet and hence she didn't wear them. 04/14/2015 -- she says she has got some transport organizer and will be here every week for regular appointments. She has been using her juxta lights at home. Objective Constitutional Pulse regular. Respirations normal and unlabored. Afebrile. Vitals Time Taken: 2:40 PM, Height: 65 in, Temperature: 98.1 F, Pulse: 68 bpm, Respiratory Rate: 20 breaths/min, Blood Pressure: 178/66 mmHg. Eyes Carroll, Fryda K. ([id number]) Nonicteric. Reactive to light. Ears, Nose, Mouth, and Throat Lips, teeth, and gums WNL.Marland Kitchen Moist mucosa without lesions . Neck supple and nontender. No palpable supraclavicular or cervical adenopathy. Normal sized without goiter. Respiratory WNL. No retractions.. Cardiovascular Pedal Pulses WNL. progressive stage II lymphedema of both lower extremities. Lymphatic No adneopathy. No adenopathy. No adenopathy. Musculoskeletal Adexa without tenderness or enlargement.. Digits and nails w/o clubbing, cyanosis, infection, petechiae, ischemia, or inflammatory conditions.Marland Kitchen Psychiatric Judgement and insight Intact.. No evidence of depression, anxiety, or agitation.. General Notes: she continues to have massive lymphedema both lower extremities and also has ulceration bilateral lower extremities which are fairly clean. Integumentary (Hair, Skin) No suspicious lesions. No crepitus or fluctuance. No peri-wound warmth or erythema. No masses.. Wound #53 status is Open. Original cause of wound was Gradually Appeared. The wound is located on the Right,Circumferential Lower Leg. The wound measures 15.5cm length x 29cm width x 0.1cm depth; 353.036cm^2 area and 35.304cm^3  volume. Wound #54 status is Open. Original cause of wound was Gradually Appeared. The wound is located on the Left,Circumferential Lower Leg. The wound measures 8cm length x 20cm width x 0.1cm depth; 125.664cm^2 area and 12.566cm^3 volume. Assessment Active Problems ICD-9 457.1 - Other Lymphedema - excludes congenital, eyelid or vulva 459.33 - Chronic venous hypertension with ulcer and inflammation 707.12 - Ulcer of lower limbs, except pressure ulcer; Ulcer of calf Roets, Tityana K. 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 She promises to be back next week for a compression wrap change. Plan Wound Cleansing: Wound #53 Right,Circumferential Lower Leg: Cleanse wound with mild soap and water Wound #54 Left,Circumferential Lower Leg: Cleanse wound with mild soap and water Skin Barriers/Peri-Wound Care: Wound #53 Right,Circumferential Lower Leg: Barrier cream Wound #54 Left,Circumferential Lower Leg:  Barrier cream Primary Wound Dressing: Wound #53 Right,Circumferential Lower Leg: Aquacel Ag Wound #54 Left,Circumferential Lower Leg: Aquacel Ag Secondary Dressing: Wound #53 Right,Circumferential Lower Leg: ABD and Kerlix/Conform Drawtex Wound #54 Left,Circumferential Lower Leg: ABD and Kerlix/Conform Drawtex Dressing Change Frequency: Wound #53 Right,Circumferential Lower Leg: Change dressing every other day. Wound #54  Left,Circumferential Lower Leg: Change dressing every other day. Follow-up Appointments: Wound #53 Right,Circumferential Lower Leg: ERSULA, COMBEST. ([id number]) Return Appointment in 1 week. Wound #54 Left,Circumferential Lower Leg: Return Appointment in 1 week. Edema Control: Wound #53 Right,Circumferential Lower Leg: 4 Layer Compression System - Bilateral Wound #54 Left,Circumferential Lower Leg: 4 Layer Compression System - Bilateral Since she is going to be compliant with regular visits to the wound center I have recommended silver alginate and 4-layer Profore compression wraps. She promises to be back next week for a compression wrap change. Electronic Signature(s) Signed: 04/14/2015 3:26:57 PM By: Christin Fudge MD, FACS Entered By: Christin Fudge on 04/14/2015 15:26:57 [name] ([id number]) -------------------------------------------------------------------------------- SuperBill Details Patient Name: [name] Date of Service: 04/14/2015 Medical Record Number: [id number] Patient Account Number: [id number] Date of Birth/Sex: 07/14/1955 (59 y.o. Female) Treating RN: Afful, RN, BSN, Velva Harman Primary Care Physician: [name] Other Clinician: Referring Physician: [name] Treating Physician/Extender: Frann Rider in Treatment: 106 Diagnosis Coding ICD-9 Codes Code Description 457.1 Other Lymphedema - excludes congenital, eyelid or vulva 459.33 Chronic venous hypertension with ulcer and inflammation 707.12 Ulcer of lower limbs, except pressure ulcer; Ulcer of calf ICD-10 Codes Code Description I89.0 Lymphedema, not elsewhere classified E66.9 Obesity, unspecified E11.40 Type 2 diabetes mellitus with diabetic neuropathy, unspecified R26.89 Other abnormalities of gait and mobility 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 I83.222 Varicose veins of left lower extremity with both ulcer of calf and  inflammation E11.622 Type 2 diabetes mellitus with other skin ulcer Physician Procedures CPT4: Description Modifier Quantity Code [id number] - WC PHYS LEVEL 3 - EST PT 1 ICD-10 Description Diagnosis I89.0 Lymphedema, not elsewhere classified 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

## 2015-04-20 ENCOUNTER — Ambulatory Visit
Admission: RE | Admit: 2015-04-20 | Discharge: 2015-04-20 | Disposition: A | Discharge status: Home / Self Care | Payer: Managed Care, Other (non HMO) | Source: Ambulatory Visit | Attending: Nephrology | Admitting: Nephrology

## 2015-04-20 DIAGNOSIS — N184 Chronic kidney disease, stage 4 (severe): Secondary | ICD-10-CM | POA: Insufficient documentation

## 2015-04-20 DIAGNOSIS — N281 Cyst of kidney, acquired: Secondary | ICD-10-CM | POA: Insufficient documentation

## 2015-04-22 ENCOUNTER — Ambulatory Visit: Payer: Managed Care, Other (non HMO) | Admitting: Surgery

## 2015-04-23 ENCOUNTER — Encounter: Payer: Managed Care, Other (non HMO) | Admitting: Surgery

## 2015-04-23 DIAGNOSIS — L97211 Non-pressure chronic ulcer of right calf limited to breakdown of skin: Secondary | ICD-10-CM | POA: Diagnosis not present

## 2015-04-24 NOTE — Progress Notes (Signed)
[name], [name] ([id number]) Visit Report for 04/23/2015 Arrival Information Details Patient Name: [name], [name]. Date of Service: 04/23/2015 3:15 PM Medical Record Number: [id number] Patient Account Number: [id number] Date of Birth/Sex: 08/23/1955 (59 y.o. Female) Treating RN: Cornell Barman Primary Care Physician: [name] Other Clinician: Referring Physician: [name] Treating Physician/Extender: Frann Rider in Treatment: 81 Visit Information History Since Last Visit Added or deleted any medications: No Patient Arrived: Wheel Chair Any new allergies or adverse reactions: No Arrival Time: 15:22 Had a fall or experienced change in No Accompanied By: friend, [name] activities of daily living that may affect Transfer Assistance: None risk of falls: Patient Identification Verified: Yes Signs or symptoms of abuse/neglect since last No Secondary Verification Process Yes visito Completed: Hospitalized since last visit: No Patient Requires Transmission- No Has Dressing in Place as Prescribed: Yes Based Precautions: Has Compression in Place as Prescribed: Yes Patient Has Alerts: Yes Pain Present Now: No Patient Alerts: Patient on Blood Thinner Electronic Signature(s) Signed: 04/23/2015 6:06:11 PM By: Gretta Cool, RN, BSN, Kim RN, BSN Entered By: Gretta Cool, RN, BSN, Kim on 04/23/2015 15:36:59 [name] ([id number]) -------------------------------------------------------------------------------- Clinic Level of Care Assessment Details Patient Name: [name]. Date of Service: 04/23/2015 3:15 PM Medical Record Number: [id number] Patient Account Number: [id number] Date of Birth/Sex: 24-May-1956 (59 y.o. Female) Treating RN: Cornell Barman Primary Care Physician: [name] Other Clinician: Referring Physician: [name] Treating Physician/Extender: Frann Rider in Treatment: 108 Clinic Level of Care Assessment Items TOOL 1 Quantity  Score []  - Use when EandM and Procedure is performed on INITIAL visit 0 ASSESSMENTS - Nursing Assessment / Reassessment []  - General Physical Exam (combine w/ comprehensive assessment (listed just 0 below) when performed on new pt. evals) []  - Comprehensive Assessment (HX, ROS, Risk Assessments, Wounds Hx, etc.) 0 ASSESSMENTS - Wound and Skin Assessment / Reassessment []  - Dermatologic / Skin Assessment (not related to wound area) 0 ASSESSMENTS - Ostomy and/or Continence Assessment and Care []  - Incontinence Assessment and Management 0 []  - Ostomy Care Assessment and Management (repouching, etc.) 0 PROCESS - Coordination of Care []  - Simple Patient / Family Education for ongoing care 0 []  - Complex (extensive) Patient / Family Education for ongoing care 0 []  - Staff obtains Programmer, systems, Records, Test Results / Process Orders 0 []  - Staff telephones HHA, Nursing Homes / Clarify orders / etc 0 []  - Routine Transfer to another Facility (non-emergent condition) 0 []  - Routine Hospital Admission (non-emergent condition) 0 []  - New Admissions / Biomedical engineer / Ordering NPWT, Apligraf, etc. 0 []  - Emergency Hospital Admission (emergent condition) 0 PROCESS - Special Needs []  - Pediatric / Minor Patient Management 0 []  - Isolation Patient Management 0 Bellard, [name] ([id number]) []  - Hearing / Language / Visual special needs 0 []  - Assessment of Community assistance (transportation, D/C planning, etc.) 0 []  - Additional assistance / Altered mentation 0 []  - Support Surface(s) Assessment (bed, cushion, seat, etc.) 0 INTERVENTIONS - Miscellaneous []  - External ear exam 0 []  - Patient Transfer (multiple staff / Civil Service fast streamer / Similar devices) 0 []  - Simple Staple / Suture removal (25 or less) 0 []  - Complex Staple / Suture removal (26 or more) 0 []  - Hypo/Hyperglycemic Management (do not check if billed separately) 0 []  - Ankle / Brachial Index (ABI) - do not check if billed  separately 0 Has the patient been seen at the hospital within the last three years: Yes Total Score: 0 Level Of Care:  ____ Engineer, maintenance) Signed: 04/23/2015 6:06:11 PM By: Gretta Cool, RN, BSN, Kim RN, BSN Entered By: Gretta Cool, RN, BSN, Kim on 04/23/2015 15:39:13 [name] ([id number]) -------------------------------------------------------------------------------- Encounter Discharge Information Details Patient Name: [name] Date of Service: 04/23/2015 3:15 PM Medical Record Number: [id number] Patient Account Number: [id number] Date of Birth/Sex: Apr 19, 1956 (59 y.o. Female) Treating RN: Cornell Barman Primary Care Physician: [name] Other Clinician: Referring Physician: [name] Treating Physician/Extender: Frann Rider in Treatment: [phone] Encounter Discharge Information Items Discharge Pain Level: 0 Discharge Condition: Stable Ambulatory Status: Wheelchair Discharge Destination: Home Transportation: Private Auto Accompanied By: self Schedule Follow-up Appointment: Yes Medication Reconciliation completed and provided to Patient/Care Yes [name]: Provided on Clinical Summary of Care: 04/23/2015 Form Type Recipient Paper Patient GI Electronic Signature(s) Signed: 04/23/2015 4:13:59 PM By: Ruthine Dose Entered By: Ruthine Dose on 04/23/2015 16:13:59 [name] ([id number]) -------------------------------------------------------------------------------- Lower Extremity Assessment Details Patient Name: [name] Date of Service: 04/23/2015 3:15 PM Medical Record Number: [id number] Patient Account Number: [id number] Date of Birth/Sex: Jan 18, 1956 (59 y.o. Female) Treating RN: Cornell Barman Primary Care Physician: [name] Other Clinician: Referring Physician: [name] Treating Physician/Extender: Frann Rider in Treatment: 108 Edema Assessment Assessed: [name]: No] [Right: No] E[Left: dema] [Right:  :] Calf Left: Right: Point of Measurement: 24 cm From Medial Instep 48.5 cm 42.5 cm Ankle Left: Right: Point of Measurement: 8 cm From Medial Instep 25 cm 24.5 cm Vascular Assessment Pulses: Posterior Tibial Dorsalis Pedis Palpable: [Left:Yes] [Right:Yes] Extremity colors, hair growth, and conditions: Extremity Color: [Left:Normal] [Right:Normal] Hair Growth on Extremity: [Left:No] [Right:No] Temperature of Extremity: [Left:Cool] [Right:Cool] Capillary Refill: [Left:< 3 seconds] [Right:< 3 seconds] Toe Nail Assessment Left: Right: Thick: Yes Yes Discolored: Yes Yes Deformed: Yes Yes Improper Length and Hygiene: No No Electronic Signature(s) Signed: 04/23/2015 6:06:11 PM By: Gretta Cool, RN, BSN, Kim RN, BSN Entered By: Gretta Cool, RN, BSN, Kim on 04/23/2015 15:37:36 [name] ([id number]) -------------------------------------------------------------------------------- Multi Wound Chart Details Patient Name: [name]. Date of Service: 04/23/2015 3:15 PM Medical Record Number: [id number] Patient Account Number: [id number] Date of Birth/Sex: 02-Aug-1955 (59 y.o. Female) Treating RN: Cornell Barman Primary Care Physician: [name] Other Clinician: Referring Physician: [name] Treating Physician/Extender: Frann Rider in Treatment: 108 Vital Signs Height(in): 65 Pulse(bpm): 60 Weight(lbs): Blood Pressure 162/47 (mmHg): Body Mass Index(BMI): Temperature(F): 98.2 Respiratory Rate 18 (breaths/min): Photos: [53:No Photos] [54:No Photos] [N/A:N/A] Wound Location: [53:Right, Circumferential Lower Leg] [54:Left, Circumferential Lower Leg] [N/A:N/A] Wounding Event: [53:Gradually Appeared] [54:Gradually Appeared] [N/A:N/A] Primary Etiology: [53:Lymphedema] [54:Lymphedema] [N/A:N/A] Date Acquired: [53:02/21/2015] [54:03/26/2015] [N/A:N/A] Weeks of Treatment: [53:8] [54:3] [N/A:N/A] Wound Status: [53:Open] [54:Open] [N/A:N/A] Clustered Wound: [53:Yes]  [54:Yes] [N/A:N/A] Measurements L x W x D 15x16x0.1 [54:7x12x0.1] [N/A:N/A] (cm) Area (cm) : [id number] [N/A:N/A] Volume (cm) : [53:18.85] [54:6.597] [N/A:N/A] % Reduction in Area: [53:47.30%] [54:32.30%] [N/A:N/A] % Reduction in Volume: 47.30% [54:32.30%] [N/A:N/A] Classification: [53:Partial Thickness] [54:Partial Thickness] [N/A:N/A] Periwound Skin Texture: No Abnormalities Noted [54:No Abnormalities Noted] [N/A:N/A] Periwound Skin [53:No Abnormalities Noted] [54:No Abnormalities Noted] [N/A:N/A] Moisture: Periwound Skin Color: No Abnormalities Noted [54:No Abnormalities Noted] [N/A:N/A] Tenderness on [53:No] [54:No] [N/A:N/A] Treatment Notes Electronic Signature(s) Signed: 04/23/2015 6:06:11 PM By: Gretta Cool, RN, BSN, Kim RN, BSN Entered By: Gretta Cool, RN, BSN, Kim on 04/23/2015 15:38:31 [name] ([name] ([id number]) -------------------------------------------------------------------------------- Multi-Disciplinary Care Plan Details Patient Name: [name]. Date of Service: 04/23/2015 3:15 PM Medical Record Number: [id number] Patient Account Number: [id number] Date of Birth/Sex: 1955/11/13 (60 y.o. Female) Treating RN: Cornell Barman  Primary Care Physician: [name] Other Clinician: Referring Physician: [name] Treating Physician/Extender: Frann Rider in Treatment: 108 Active Inactive Electronic Signature(s) Signed: 04/23/2015 6:06:11 PM By: Gretta Cool, RN, BSN, Kim RN, BSN Entered By: Gretta Cool, RN, BSN, Kim on 04/23/2015 15:38:16 [name] ([id number]) -------------------------------------------------------------------------------- Pain Assessment Details Patient Name: [name]. Date of Service: 04/23/2015 3:15 PM Medical Record Number: [id number] Patient Account Number: [id number] Date of Birth/Sex: August 03, 1955 (59 y.o. Female) Treating RN: Cornell Barman Primary Care Physician: [name] Other  Clinician: Referring Physician: [name] Treating Physician/Extender: Frann Rider in Treatment: 108 Active Problems Location of Pain Severity and Description of Pain Patient Has Paino No Site Locations Pain Management and Medication Current Pain Management: Electronic Signature(s) Signed: 04/23/2015 6:06:11 PM By: Gretta Cool, RN, BSN, Kim RN, BSN Entered By: Gretta Cool, RN, BSN, Kim on 04/23/2015 15:37:08 [name] ([id number]) -------------------------------------------------------------------------------- Patient/Caregiver Education Details Patient Name: [name] Date of Service: 04/23/2015 3:15 PM Medical Record Number: [id number] Patient Account Number: [id number] Date of Birth/Gender: [birth year] (59 y.o. Female) Treating RN: Cornell Barman Primary Care Physician: [name] Other Clinician: Referring Physician: [name] Treating Physician/Extender: Frann Rider in Treatment: 108 Education Assessment Education Provided To: Patient Education Topics Provided Wound/Skin Impairment: Handouts: Caring for Your Ulcer, Other: wound care as prescribed Methods: Demonstration, Explain/Verbal Responses: State content correctly Electronic Signature(s) Signed: 04/23/2015 6:06:11 PM By: Gretta Cool, RN, BSN, Kim RN, BSN Entered By: Gretta Cool, RN, BSN, Kim on 04/23/2015 15:52:50 [name] ([id number]) -------------------------------------------------------------------------------- Wound Assessment Details Patient Name: [name] Date of Service: 04/23/2015 3:15 PM Medical Record Number: [id number] Patient Account Number: [id number] Date of Birth/Sex: 12-Mar-1956 (59 y.o. Female) Treating RN: Cornell Barman Primary Care Physician: [name] Other Clinician: Referring Physician: [name] Treating Physician/Extender: BURNS III, WALTER Weeks in Treatment: 108 Wound Status Wound Number: [id number] Primary Etiology: Lymphedema Wound Location:  Right, Circumferential Lower Wound Status: Open Leg Wounding Event: Gradually Appeared Date Acquired: 02/21/2015 Weeks Of Treatment: 8 Clustered Wound: Yes Photos Photo Uploaded By: Gretta Cool, RN, BSN, Kim on 04/23/2015 18:02:59 Wound Measurements Length: (cm) 15 Width: (cm) 16 Depth: (cm) 0.1 Area: (cm) 188.496 Volume: (cm) 18.85 % Reduction in Area: 47.3% % Reduction in Volume: 47.3% Wound Description Classification: Partial Thickness Periwound Skin Texture Texture Color No Abnormalities Noted: No No Abnormalities Noted: No Moisture No Abnormalities Noted: No Treatment Notes Wound #53 (Right, Circumferential Lower Leg) Pottle, [name] ([id number]) 1. Cleansed with: Clean wound with Normal Saline Cleanse wound with antibacterial soap and water 2. Anesthetic Topical Lidocaine 4% cream to wound bed prior to debridement 4. Dressing Applied: Other dressing (specify in notes) 5. Secondary Dressing Applied ABD Pad 7. Secured with 4 Layer Compression System - Bilateral Notes Curator) Signed: 04/23/2015 6:06:11 PM By: Gretta Cool, RN, BSN, Kim RN, BSN Entered By: Gretta Cool, RN, BSN, Kim on 04/23/2015 15:36:26 [name] ([id number]) -------------------------------------------------------------------------------- Wound Assessment Details Patient Name: [name] Date of Service: 04/23/2015 3:15 PM Medical Record Number: [id number] Patient Account Number: [id number] Date of Birth/Sex: Dec 03, 1955 (59 y.o. Female) Treating RN: Cornell Barman Primary Care Physician: [name] Other Clinician: Referring Physician: [name] Treating Physician/Extender: BURNS III, WALTER Weeks in Treatment: 108 Wound Status Wound Number: 54 Primary Etiology: Lymphedema Wound Location: Left, Circumferential Lower Leg Wound Status: Open Wounding Event: Gradually Appeared Date Acquired: 03/26/2015 Weeks Of Treatment: 3 Clustered Wound:  Yes Photos Photo Uploaded By: Gretta Cool, RN, BSN, Kim on 04/23/2015 18:03:21 Wound Measurements Length: (cm) 7 Width: (  cm) 12 Depth: (cm) 0.1 Area: (cm) 65.973 Volume: (cm) 6.597 % Reduction in Area: 32.3% % Reduction in Volume: 32.3% Wound Description Classification: Partial Thickness Periwound Skin Texture Texture Color No Abnormalities Noted: No No Abnormalities Noted: No Moisture No Abnormalities Noted: No Treatment Notes Wound #54 (Left, Circumferential Lower Leg) 1. Cleansed withLEKEYA, [name] ([id number]) Clean wound with Normal Saline Cleanse wound with antibacterial soap and water 2. Anesthetic Topical Lidocaine 4% cream to wound bed prior to debridement 4. Dressing Applied: Other dressing (specify in notes) 5. Secondary Dressing Applied ABD Pad 7. Secured with 4 Layer Compression System - Bilateral Notes Curator) Signed: 04/23/2015 6:06:11 PM By: Gretta Cool, RN, BSN, Kim RN, BSN Entered By: Gretta Cool, RN, BSN, Kim on 04/23/2015 15:36:27 [name] ([id number]) -------------------------------------------------------------------------------- Vitals Details Patient Name: [name] Date of Service: 04/23/2015 3:15 PM Medical Record Number: [id number] Patient Account Number: [id number] Date of Birth/Sex: 26-Feb-1956 (59 y.o. Female) Treating RN: Cornell Barman Primary Care Physician: [name] Other Clinician: Referring Physician: [name] Treating Physician/Extender: Frann Rider in Treatment: 108 Vital Signs Time Taken: 15:23 Temperature (F): 98.2 Height (in): 65 Pulse (bpm): 60 Respiratory Rate (breaths/min): 18 Blood Pressure (mmHg): 162/47 Reference Range: 80 - 120 mg / dl Electronic Signature(s) Signed: 04/23/2015 6:06:11 PM By: Gretta Cool, RN, BSN, Kim RN, BSN Entered By: Gretta Cool, RN, BSN, Kim on 04/23/2015 15:37:16

## 2015-04-24 NOTE — Progress Notes (Signed)
MERRIANN, FOGEL ([id number]) Visit Report for 04/23/2015 Chief Complaint Document Details Patient Name: [name], [name]. Date of Service: 04/23/2015 3:15 PM Medical Record Number: [id number] Patient Account Number: [id number] Date of Birth/Sex: December 04, 1955 (59 y.o. 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 Electronic Signature(s) Signed: 04/23/2015 3:46:32 PM By: Christin Fudge MD, FACS Entered By: Christin Fudge on 04/23/2015 15:46:32 [name] ([id number]) -------------------------------------------------------------------------------- HPI Details Patient Name: [name] Date of Service: 04/23/2015 3:15 PM Medical Record Number: [id number] Patient Account Number: [id number] Date of Birth/Sex: 08-21-1955 (59 y.o. Female) Treating RN: Cornell Barman Primary Care Physician: [name] Other Clinician: Referring Physician: [name] Treating Physician/Extender: Frann Rider in Treatment: 52 History of Present Illness HPI Description: 59yo w/ h/o BLE phlebolymphedema, type 2 DM, and obesity. No PVD. h/o chronic, recurrent BLE calf ulcers. Treated with 4 layer compression. Infrequently uses lymphedema pump. Bilateral lower extremity ulcerations healed as of June 2016. Fitted for custom compression stockings but not yet received. Awaiting lymphedema consult. She was transitioned to juxtalite compression garment on the left. Compression bandage applied to right leg on 02/25/2015. Patient has not followed up since that time, wearing same compression bandage. She returns to clinic today and has developed recurrent bilateral calf ulcerations. No significant pain. No fever or  chills. Moderate drainage. 04/07/2015 -- her hip surgery was canceled due to medical reasons and she is here without any compression today. I understand from Dr. Quay Burow note that she has been very noncompliant in the last time did not come back for over a month and had the same compression bandage. Today she does not have any compression and she says they were wet and hence she didn't wear them. 04/14/2015 -- she says she has got some transport organizer and will be here every week for regular appointments. She has been using her juxta lights at home. Electronic Signature(s) Signed: 04/23/2015 3:46:39 PM By: Christin Fudge MD, FACS Entered By: Christin Fudge on 04/23/2015 15:46:39 [name] ([id number]) -------------------------------------------------------------------------------- Physical Exam Details Patient Name: [name] Date of Service: 04/23/2015 3:15 PM Medical Record Number: [id number] Patient Account Number: [id number] Date of Birth/Sex: 03-27-1956 (59 y.o. Female) Treating RN: Cornell Barman Primary Care Physician: [name] Other Clinician: Referring Physician: [name] Treating Physician/Extender: Frann Rider in Treatment: 108 Constitutional . Pulse regular. Respirations normal and unlabored. Afebrile. . Eyes Nonicteric. Reactive to light. Ears, Nose, Mouth, and Throat Lips, teeth, and gums WNL.Marland Kitchen Moist mucosa without lesions . Neck supple and nontender. No palpable supraclavicular or cervical adenopathy. Normal sized without goiter. Respiratory WNL. No retractions.. Cardiovascular Pedal Pulses WNL. stage II lymphedema of both lower extremities.. Lymphatic No adneopathy. No adenopathy. No adenopathy. Musculoskeletal Adexa without tenderness or enlargement.. Digits and nails w/o clubbing, cyanosis, infection, petechiae, ischemia, or inflammatory conditions.. Integumentary (Hair, Skin) No suspicious lesions. No crepitus or fluctuance. No  peri-wound warmth or erythema. No masses.Marland Kitchen Psychiatric Judgement and insight Intact.. No evidence of depression, anxiety, or agitation.. Notes the wounds on the lower part of her legs are still oozing quite significantly but overall her edema has come down Electronic Signature(s) Signed: 04/23/2015 3:47:21 PM By: Christin Fudge MD, FACS Entered By: Christin Fudge on 04/23/2015 15:47:21 [name] ([id number]) -------------------------------------------------------------------------------- Physician Orders Details Patient Name: [name]. Date of Service: 04/23/2015 3:15 PM  Medical Record Number: [id number] Patient Account Number: [id number] Date of Birth/Sex: 1956-06-08 (59 y.o. Female) Treating RN: Cornell Barman Primary Care Physician: [name] Other Clinician: Referring Physician: [name] Treating Physician/Extender: Frann Rider in Treatment: 13 Verbal / Phone Orders: Yes Clinician: Cornell Barman Read Back and Verified: Yes Diagnosis Coding Wound Cleansing Wound #53 Right,Circumferential Lower Leg o Cleanse wound with mild soap and water Wound #54 Left,Circumferential Lower Leg o Cleanse wound with mild soap and water Anesthetic Wound #53 Right,Circumferential Lower Leg o Topical Lidocaine 4% cream applied to wound bed prior to debridement Wound #54 Left,Circumferential Lower Leg o Topical Lidocaine 4% cream applied to wound bed prior to debridement Primary Wound Dressing Wound #53 Right,Circumferential Lower Leg o Other: - siltec and siltec sorbact Wound #54 Left,Circumferential Lower Leg o Other: - siltec and siltec sorbact Secondary Dressing Wound #53 Right,Circumferential Lower Leg o ABD pad Wound #54 Left,Circumferential Lower Leg o ABD pad Dressing Change Frequency Wound #53 Right,Circumferential Lower Leg o Change dressing every week Wound #54 Left,Circumferential Lower Leg o Change dressing every week Follow-up  Appointments [name], [name] ([id number]) Wound #53 Right,Circumferential Lower Leg o Return Appointment in 1 week. Wound #54 Left,Circumferential Lower Leg o Return Appointment in 1 week. Edema Control Wound #53 Right,Circumferential Lower Leg o 4 Layer Compression System - Bilateral Wound #54 Left,Circumferential Lower Leg o 4 Layer Compression System - Bilateral Electronic Signature(s) Signed: 04/23/2015 3:59:19 PM By: Christin Fudge MD, FACS Signed: 04/23/2015 6:06:11 PM By: Gretta Cool RN, BSN, Kim RN, BSN Entered By: Gretta Cool, RN, BSN, Kim on 04/23/2015 15:51:03 [name] ([id number]) -------------------------------------------------------------------------------- Problem List Details Patient Name: [name]. Date of Service: 04/23/2015 3:15 PM Medical Record Number: [id number] Patient Account Number: [id number] Date of Birth/Sex: 05/17/1956 (59 y.o. Female) Treating RN: Cornell Barman Primary Care Physician: [name] Other Clinician: Referring Physician: [name] Treating Physician/Extender: Frann Rider in Treatment: 108 Active Problems ICD-9 Encounter Code Description Active Date Diagnosis 457.1 Other Lymphedema - excludes congenital, eyelid or vulva 03/27/2013 Yes 459.33 Chronic venous hypertension with ulcer and inflammation 03/27/2013 Yes 707.12 Ulcer of lower limbs, except pressure ulcer; Ulcer of calf 03/27/2013 Yes ICD-10 Encounter Code Description Active Date Diagnosis I89.0 Lymphedema, not elsewhere classified 03/26/2014 Yes E66.9 Obesity, unspecified 03/26/2014 Yes E11.40 Type 2 diabetes mellitus with diabetic neuropathy, 01/14/2015 Yes unspecified R26.89 Other abnormalities of gait and mobility 01/14/2015 Yes 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 02/25/2015 Yes calf and inflammation I83.222 Varicose veins of left lower extremity with both ulcer of 11/12/2014 Yes calf and inflammation [name], [name] ([id number]) E11.622 Type 2 diabetes mellitus with other skin ulcer 02/25/2015 Yes Inactive Problems Resolved Problems ICD-10 Code Description Active Date Resolved Date E11.621 Type 2 diabetes mellitus with foot ulcer 10/08/2014 10/09/2014 L03.115 Cellulitis of right lower limb 02/25/2015 02/25/2015 L84 Corns and callosities 11/12/2014 11/12/2014 Electronic Signature(s) Signed: 04/23/2015 3:46:24 PM By: Christin Fudge MD, FACS Entered By: Christin Fudge on 04/23/2015 15:46:23 [name] ([id number]) -------------------------------------------------------------------------------- Progress Note Details Patient Name: [name]. Date of Service: 04/23/2015 3:15 PM Medical Record Number: [id number] Patient Account Number: [id number] Date of Birth/Sex: 04-29-1956 (59 y.o. 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 History of Present Illness (HPI) 59yo w/ h/o BLE phlebolymphedema, type 2 DM, and obesity. No PVD.  h/o chronic, recurrent BLE calf ulcers. Treated with 4 layer compression. Infrequently uses lymphedema pump. Bilateral lower extremity ulcerations healed as of June 2016. Fitted for custom compression stockings but not yet received. Awaiting lymphedema consult. She was transitioned to juxtalite compression garment on the left. Compression bandage applied to right leg on 02/25/2015. Patient has not followed up since that time, wearing same compression bandage. She returns to clinic today and has developed recurrent bilateral calf ulcerations. No significant pain. No fever or chills. Moderate drainage. 04/07/2015 -- her hip surgery was canceled due to medical reasons and she is here  without any compression today. I understand from Dr. Quay Burow note that she has been very noncompliant in the last time did not come back for over a month and had the same compression bandage. Today she does not have any compression and she says they were wet and hence she didn't wear them. 04/14/2015 -- she says she has got some transport organizer and will be here every week for regular appointments. She has been using her juxta lights at home. Objective Constitutional Pulse regular. Respirations normal and unlabored. Afebrile. Vitals Time Taken: 3:23 PM, Height: 65 in, Temperature: 98.2 F, Pulse: 60 bpm, Respiratory Rate: 18 breaths/min, Blood Pressure: 162/47 mmHg. Eyes [name], [name] ([id number]) Nonicteric. Reactive to light. Ears, Nose, Mouth, and Throat Lips, teeth, and gums WNL.Marland Kitchen Moist mucosa without lesions . Neck supple and nontender. No palpable supraclavicular or cervical adenopathy. Normal sized without goiter. Respiratory WNL. No retractions.. Cardiovascular Pedal Pulses WNL. stage II lymphedema of both lower extremities.. Lymphatic No adneopathy. No adenopathy. No adenopathy. Musculoskeletal Adexa without tenderness or enlargement.. Digits and nails w/o clubbing, cyanosis, infection, petechiae, ischemia, or inflammatory conditions.Marland Kitchen Psychiatric Judgement and insight Intact.. No evidence of depression, anxiety, or agitation.. General Notes: the wounds on the lower part of her legs are still oozing quite significantly but overall her edema has come down Integumentary (Hair, Skin) No suspicious lesions. No crepitus or fluctuance. No peri-wound warmth or erythema. No masses.. Wound #53 status is Open. Original cause of wound was Gradually Appeared. The wound is located on the Right,Circumferential Lower Leg. The wound measures 15cm length x 16cm width x 0.1cm depth; 188.496cm^2 area and 18.85cm^3 volume. Wound #54 status is Open. Original cause of wound was  Gradually Appeared. The wound is located on the Left,Circumferential Lower Leg. The wound measures 7cm length x 12cm width x 0.1cm depth; 65.973cm^2 area and 6.597cm^3 volume. Assessment Active Problems ICD-9 457.1 - Other Lymphedema - excludes congenital, eyelid or vulva 459.33 - Chronic venous hypertension with ulcer and inflammation 707.12 - Ulcer of lower limbs, except pressure ulcer; Ulcer of calf [name], [name] 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 She will then have a Profore 4-layer wrap. I have asked her to continue with elevation of her limbs and see as an regular basis every 7 days. Plan Wound Cleansing: Wound #53 Right,Circumferential Lower Leg: Cleanse wound with mild soap and water Wound #54 Left,Circumferential  Lower Leg: Cleanse wound with mild soap and water Anesthetic: Wound #53 Right,Circumferential Lower Leg: Topical Lidocaine 4% cream applied to wound bed prior to debridement Wound #54 Left,Circumferential Lower Leg: Topical Lidocaine 4% cream applied to wound bed prior to debridement Primary Wound Dressing: Wound #53 Right,Circumferential Lower Leg: Other: - siltec and siltec sorbact Wound #54 Left,Circumferential Lower Leg: Other: - siltec and siltec sorbact Secondary Dressing: Wound #53 Right,Circumferential Lower Leg: ABD pad Wound #54 Left,Circumferential Lower Leg: ABD pad Dressing Change  Frequency: Wound #53 Right,Circumferential Lower Leg: Change dressing every week Wound #54 Left,Circumferential Lower Leg: Change dressing every week Follow-up Appointments: Wound #53 Right,Circumferential Lower Leg: Return Appointment in 1 week. [name], [name] ([id number]) Wound #54 Left,Circumferential Lower Leg: Return Appointment in 1 week. Edema Control: Wound #53 Right,Circumferential Lower Leg: 4 Layer Compression System - Bilateral Wound #54 Left,Circumferential Lower Leg: 4 Layer Compression System - Bilateral I have recommended that we use Siltec Sorbact on the left lower extremity and the right lower extremity and plain sorbact in areas which are smaller. She will then have a Profore 4-layer wrap. I have asked her to continue with elevation of her limbs and see as an regular basis every 7 days. Electronic Signature(s) Signed: 04/23/2015 4:00:51 PM By: Christin Fudge MD, FACS Previous Signature: 04/23/2015 3:49:11 PM Version By: Christin Fudge MD, FACS Entered By: Christin Fudge on 04/23/2015 16:00:51 [name] ([id number]) -------------------------------------------------------------------------------- SuperBill Details Patient Name: [name] Date of Service: 04/23/2015 Medical Record Number: [id number] Patient Account Number: [id number] Date of Birth/Sex: 31-Mar-1956 (59 y.o. Female) Treating RN: Cornell Barman Primary Care Physician: [name] Other Clinician: Referring Physician: [name] Treating Physician/Extender: Frann Rider in Treatment: 108 Diagnosis Coding ICD-9 Codes Code Description 457.1 Other Lymphedema - excludes congenital, eyelid or vulva 459.33 Chronic venous hypertension with ulcer and inflammation 707.12 Ulcer of lower limbs, except pressure ulcer; Ulcer of calf ICD-10 Codes Code Description I89.0 Lymphedema, not elsewhere classified E66.9 Obesity, unspecified E11.40 Type 2 diabetes mellitus with diabetic  neuropathy, unspecified R26.89 Other abnormalities of gait and mobility 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 ICD-10 Description Diagnosis I89.0 Lymphedema, not elsewhere classified Physician Procedures CPT4: Description Modifier Quantity Code [id number] - WC PHYS LEVEL 3 - EST PT 1 ICD-10 Description Diagnosis I89.0 Lymphedema, not elsewhere classified E66.9 Obesity, unspecified E11.40 Type 2 diabetes mellitus with diabetic neuropathy, unspecified  [name], [name] ([id number]) Electronic Signature(s) Signed: 04/23/2015 3:59:19 PM By: Christin Fudge MD, FACS Signed: 04/23/2015 6:06:11 PM By: Gretta Cool RN, BSN, Kim RN, BSN Previous Signature: 04/23/2015 3:49:32 PM Version By: Christin Fudge MD, FACS Entered By: Gretta Cool, RN, BSN, Kim on 04/23/2015 15:51:42

## 2015-04-29 ENCOUNTER — Ambulatory Visit: Payer: Managed Care, Other (non HMO) | Admitting: Surgery

## 2015-04-30 ENCOUNTER — Encounter: Payer: Managed Care, Other (non HMO) | Attending: Surgery | Admitting: Surgery

## 2015-04-30 DIAGNOSIS — I890 Lymphedema, not elsewhere classified: Secondary | ICD-10-CM | POA: Insufficient documentation

## 2015-04-30 DIAGNOSIS — R2689 Other abnormalities of gait and mobility: Secondary | ICD-10-CM | POA: Diagnosis not present

## 2015-04-30 DIAGNOSIS — E1140 Type 2 diabetes mellitus with diabetic neuropathy, unspecified: Secondary | ICD-10-CM | POA: Diagnosis not present

## 2015-04-30 DIAGNOSIS — E11622 Type 2 diabetes mellitus with other skin ulcer: Secondary | ICD-10-CM | POA: Diagnosis not present

## 2015-04-30 DIAGNOSIS — E669 Obesity, unspecified: Secondary | ICD-10-CM | POA: Insufficient documentation

## 2015-04-30 DIAGNOSIS — I83222 Varicose veins of left lower extremity with both ulcer of calf and inflammation: Secondary | ICD-10-CM | POA: Insufficient documentation

## 2015-04-30 DIAGNOSIS — L97811 Non-pressure chronic ulcer of other part of right lower leg limited to breakdown of skin: Secondary | ICD-10-CM | POA: Diagnosis not present

## 2015-04-30 DIAGNOSIS — I83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: Secondary | ICD-10-CM | POA: Diagnosis not present

## 2015-04-30 DIAGNOSIS — L97821 Non-pressure chronic ulcer of other part of left lower leg limited to breakdown of skin: Secondary | ICD-10-CM | POA: Diagnosis not present

## 2015-05-01 NOTE — Progress Notes (Signed)
SHAQUONDA, STELMA ([id number]) Visit Report for 04/30/2015 Chief Complaint Document Details Patient Name: [name], [name]. Date of Service: 04/30/2015 3:00 PM Medical Record Number: [id number] Patient Account Number: [id number] Date of Birth/Sex: [birth year] (59 y.o. 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 Electronic Signature(s) Signed: 04/30/2015 3:32:29 PM By: Christin Fudge MD, FACS Entered By: Christin Fudge on 04/30/2015 15:32:29 [name] ([id number]) -------------------------------------------------------------------------------- HPI Details Patient Name: [name] Date of Service: 04/30/2015 3:00 PM Medical Record Number: [id number] Patient Account Number: [id number] Date of Birth/Sex: 04-Jul-1955 (59 y.o. Female) Treating RN: Montey Hora Primary Care Physician: [name] Other Clinician: Referring Physician: [name] Treating Physician/Extender: Frann Rider in Treatment: 76 History of Present Illness HPI Description: 59yo w/ h/o BLE phlebolymphedema, type 2 DM, and obesity. No PVD. h/o chronic, recurrent BLE calf ulcers. Treated with 4 layer compression. Infrequently uses lymphedema pump. Bilateral lower extremity ulcerations healed as of June 2016. Fitted for custom compression stockings but not yet received. Awaiting lymphedema consult. She was transitioned to juxtalite compression garment on the left. Compression bandage applied to right leg on 02/25/2015. Patient has not followed up since that time, wearing same compression bandage. She returns to clinic today and has developed recurrent bilateral calf ulcerations. No significant pain. No fever or  chills. Moderate drainage. 04/07/2015 -- her hip surgery was canceled due to medical reasons and she is here without any compression today. I understand from Dr. Quay Burow note that she has been very noncompliant in the last time did not come back for over a month and had the same compression bandage. Today she does not have any compression and she says they were wet and hence she didn't wear them. 04/14/2015 -- she says she has got some transport organizer and will be here every week for regular appointments. She has been using her juxta lights at home. Electronic Signature(s) Signed: 04/30/2015 3:32:37 PM By: Christin Fudge MD, FACS Entered By: Christin Fudge on 04/30/2015 15:32:36 [name] ([id number]) -------------------------------------------------------------------------------- Physical Exam Details Patient Name: [name] Date of Service: 04/30/2015 3:00 PM Medical Record Number: [id number] Patient Account Number: [id number] Date of Birth/Sex: 1955/09/10 (59 y.o. Female) Treating RN: Montey Hora Primary Care Physician: [name] Other Clinician: Referring Physician: [name] Treating Physician/Extender: Frann Rider in Treatment: 109 Constitutional . Pulse regular. Respirations normal and unlabored. Afebrile. . Eyes Nonicteric. Reactive to light. Ears, Nose, Mouth, and Throat Lips, teeth, and gums WNL.Marland Kitchen Moist mucosa without lesions . Neck supple and nontender. No palpable supraclavicular or cervical adenopathy. Normal sized without goiter. Respiratory WNL. No retractions.. Breath sounds WNL, No rubs, rales, rhonchi, or wheeze.. Cardiovascular Heart rhythm and rate regular, no murmur or gallop.. Pedal Pulses WNL. No clubbing, cyanosis or edema. Chest Breasts symmetical and no nipple discharge.. Breast tissue WNL, no masses, lumps, or tenderness.. Lymphatic No adneopathy. No adenopathy. No adenopathy. Musculoskeletal Adexa without tenderness  or enlargement.. Digits and nails w/o clubbing, cyanosis, infection, petechiae, ischemia, or inflammatory conditions.. Integumentary (Hair, Skin) No suspicious lesions. No crepitus or fluctuance. No peri-wound warmth or erythema. No masses.Marland Kitchen Psychiatric Judgement and insight Intact.. No evidence of depression, anxiety, or agitation.. Notes Both the legs look very good edema has come down significantly and the ulcerations are minimal. Electronic Signature(s) Signed: 04/30/2015 3:33:09 PM By: Christin Fudge MD, FACS  Entered By: Christin Fudge on 04/30/2015 15:33:09 [name] ([id number]) -------------------------------------------------------------------------------- Physician Orders Details Patient Name: [name] Date of Service: 04/30/2015 3:00 PM Medical Record Number: [id number] Patient Account Number: [id number] Date of Birth/Sex: 02/26/1956 (59 y.o. Female) Treating RN: Montey Hora Primary Care Physician: [name] Other Clinician: Referring Physician: [name] Treating Physician/Extender: Frann Rider in Treatment: 29 Verbal / Phone Orders: Yes Clinician: Montey Hora Read Back and Verified: Yes Diagnosis Coding Wound Cleansing Wound #53 Right,Circumferential Lower Leg o Cleanse wound with mild soap and water Wound #54 Left,Circumferential Lower Leg o Cleanse wound with mild soap and water Wound #55 Right,Proximal,Lateral Lower Leg o Cleanse wound with mild soap and water Anesthetic Wound #53 Right,Circumferential Lower Leg o Topical Lidocaine 4% cream applied to wound bed prior to debridement Wound #54 Left,Circumferential Lower Leg o Topical Lidocaine 4% cream applied to wound bed prior to debridement Wound #55 Right,Proximal,Lateral Lower Leg o Topical Lidocaine 4% cream applied to wound bed prior to debridement Primary Wound Dressing Wound #53 Right,Circumferential Lower Leg o Other: - siltec and siltec  sorbact Wound #54 Left,Circumferential Lower Leg o Other: - siltec and siltec sorbact Wound #55 Right,Proximal,Lateral Lower Leg o Other: - siltec and siltec sorbact Secondary Dressing Wound #53 Right,Circumferential Lower Leg o ABD pad Wound #54 Left,Circumferential Lower Leg o ABD pad Zafar, Coco K. ([id number]) Wound #55 Right,Proximal,Lateral Lower Leg o ABD pad Dressing Change Frequency Wound #53 Right,Circumferential Lower Leg o Change dressing every week Wound #54 Left,Circumferential Lower Leg o Change dressing every week Wound #55 Right,Proximal,Lateral Lower Leg o Change dressing every week Follow-up Appointments Wound #53 Right,Circumferential Lower Leg o Return Appointment in 1 week. Wound #54 Left,Circumferential Lower Leg o Return Appointment in 1 week. Wound #55 Right,Proximal,Lateral Lower Leg o Return Appointment in 1 week. Edema Control Wound #53 Right,Circumferential Lower Leg o 4 Layer Compression System - Bilateral Wound #54 Left,Circumferential Lower Leg o 4 Layer Compression System - Bilateral Wound #55 Right,Proximal,Lateral Lower Leg o 4 Layer Compression System - Bilateral Electronic Signature(s) Signed: 04/30/2015 3:36:42 PM By: Christin Fudge MD, FACS Signed: 04/30/2015 5:32:28 PM By: Montey Hora Entered By: Montey Hora on 04/30/2015 15:30:41 [name] ([id number]) -------------------------------------------------------------------------------- Problem List Details Patient Name: [name]. Date of Service: 04/30/2015 3:00 PM Medical Record Number: [id number] Patient Account Number: [id number] Date of Birth/Sex: [birth year] (59 y.o. Female) Treating RN: Montey Hora Primary Care Physician: [name] Other Clinician: Referring Physician: [name] Treating Physician/Extender: Frann Rider in Treatment: 109 Active Problems ICD-9 Encounter Code Description Active  Date Diagnosis 457.1 Other Lymphedema - excludes congenital, eyelid or vulva 03/27/2013 Yes 459.33 Chronic venous hypertension with ulcer and inflammation 03/27/2013 Yes 707.12 Ulcer of lower limbs, except pressure ulcer; Ulcer of calf 03/27/2013 Yes ICD-10 Encounter Code Description Active Date Diagnosis I89.0 Lymphedema, not elsewhere classified 03/26/2014 Yes E66.9 Obesity, unspecified 03/26/2014 Yes E11.40 Type 2 diabetes mellitus with diabetic neuropathy, 01/14/2015 Yes unspecified R26.89 Other abnormalities of gait and mobility 01/14/2015 Yes 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 ([id number]) E11.622 Type 2 diabetes mellitus with other skin ulcer 02/25/2015 Yes Inactive Problems Resolved Problems ICD-10 Code Description Active Date Resolved Date E11.621 Type 2 diabetes mellitus with foot ulcer 10/08/2014 10/09/2014 L03.115 Cellulitis of right lower limb 02/25/2015 02/25/2015 L84 Corns and callosities 11/12/2014 11/12/2014 Electronic Signature(s) Signed: 04/30/2015 3:32:16  PM By: Christin Fudge MD, FACS Entered By: Christin Fudge on 04/30/2015 15:32:16 [name] ([id number]) -------------------------------------------------------------------------------- Progress Note Details Patient Name: [name]. Date of Service: 04/30/2015 3:00 PM Medical Record Number: [id number] Patient Account Number: [id number] Date of Birth/Sex: Nov 05, 1955 (59 y.o. 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 History of  Present Illness (HPI) 59yo w/ h/o BLE phlebolymphedema, type 2 DM, and obesity. No PVD. h/o chronic, recurrent BLE calf ulcers. Treated with 4 layer compression. Infrequently uses lymphedema pump. Bilateral lower extremity ulcerations healed as of June 2016. Fitted for custom compression stockings but not yet received. Awaiting lymphedema consult. She was transitioned to juxtalite compression garment on the left. Compression bandage applied to right leg on 02/25/2015. Patient has not followed up since that time, wearing same compression bandage. She returns to clinic today and has developed recurrent bilateral calf ulcerations. No significant pain. No fever or chills. Moderate drainage. 04/07/2015 -- her hip surgery was canceled due to medical reasons and she is here without any compression today. I understand from Dr. Quay Burow note that she has been very noncompliant in the last time did not come back for over a month and had the same compression bandage. Today she does not have any compression and she says they were wet and hence she didn't wear them. 04/14/2015 -- she says she has got some transport organizer and will be here every week for regular appointments. She has been using her juxta lights at home. Objective Constitutional Pulse regular. Respirations normal and unlabored. Afebrile. Vitals Time Taken: 3:08 PM, Height: 65 in, Temperature: 98.3 F, Pulse: 55 bpm, Respiratory Rate: 18 breaths/min, Blood Pressure: 171/55 mmHg. Eyes Luallen, Jaylnn K. ([id number]) Nonicteric. Reactive to light. Ears, Nose, Mouth, and Throat Lips, teeth, and gums WNL.Marland Kitchen Moist mucosa without lesions . Neck supple and nontender. No palpable supraclavicular or cervical adenopathy. Normal sized without goiter. Respiratory WNL. No retractions.. Breath sounds WNL, No rubs, rales, rhonchi, or wheeze.. Cardiovascular Heart rhythm and rate regular, no murmur or gallop.. Pedal Pulses WNL. No clubbing, cyanosis  or edema. Chest Breasts symmetical and no nipple discharge.. Breast tissue WNL, no masses, lumps, or tenderness.. Lymphatic No adneopathy. No adenopathy. No adenopathy. Musculoskeletal Adexa without tenderness or enlargement.. Digits and nails w/o clubbing, cyanosis, infection, petechiae, ischemia, or inflammatory conditions.Marland Kitchen Psychiatric Judgement and insight Intact.. No evidence of depression, anxiety, or agitation.. General Notes: Both the legs look very good edema has come down significantly and the ulcerations are minimal. Integumentary (Hair, Skin) No suspicious lesions. No crepitus or fluctuance. No peri-wound warmth or erythema. No masses.. Wound #53 status is Open. Original cause of wound was Gradually Appeared. The wound is located on the Right,Circumferential Lower Leg. The wound measures 1cm length x 5cm width x 0.1cm depth; 3.927cm^2 area and 0.393cm^3 volume. The wound is limited to skin breakdown. There is no tunneling or undermining noted. There is a small amount of serous drainage noted. The wound margin is flat and intact. There is large (67-100%) red granulation within the wound bed. There is no necrotic tissue within the wound bed. The periwound skin appearance did not exhibit: Callus, Crepitus, Excoriation, Fluctuance, Friable, Induration, Localized Edema, Rash, Scarring, Dry/Scaly, Maceration, Moist, Atrophie Blanche, Cyanosis, Ecchymosis, Hemosiderin Staining, Mottled, Pallor, Rubor, Erythema. Wound #54 status is Open. Original cause of wound  was Gradually Appeared. The wound is located on the Left,Circumferential Lower Leg. The wound measures 9.5cm length x 7.5cm width x 0.1cm depth; 55.96cm^2 area and 5.596cm^3 volume. The wound is limited to skin breakdown. There is no tunneling or undermining noted. There is a medium amount of serous drainage noted. The wound margin is flat and intact. There is large (67-100%) red granulation within the wound bed. There is no  necrotic tissue within the wound bed. The periwound skin appearance did not exhibit: Callus, Crepitus, Excoriation, Fluctuance, Friable, Induration, Localized Edema, Rash, Scarring, Dry/Scaly, Maceration, Moist, Atrophie Blanche, Cyanosis, Ecchymosis, Hemosiderin Staining, Mottled, Pallor, Rubor, Erythema. ROSELYNN, CAGE ([id number]) Wound #55 status is Open. Original cause of wound was Blister. The wound is located on the Right,Proximal,Lateral Lower Leg. The wound measures 11cm length x 3cm width x 0.1cm depth; 25.918cm^2 area and 2.592cm^3 volume. The wound is limited to skin breakdown. There is no tunneling or undermining noted. There is a large amount of serous drainage noted. The wound margin is flat and intact. There is large (67-100%) pink granulation within the wound bed. There is no necrotic tissue within the wound bed. The periwound skin appearance did not exhibit: Callus, Crepitus, Excoriation, Fluctuance, Friable, Induration, Localized Edema, Rash, Scarring, Dry/Scaly, Maceration, Moist, Atrophie Blanche, Cyanosis, Ecchymosis, Hemosiderin Staining, Mottled, Pallor, Rubor, Erythema. The periwound has tenderness on palpation. 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 I83.222 - Varicose veins of left lower extremity with both ulcer of calf and inflammation E11.622 - Type 2 diabetes mellitus with other skin ulcer Plan Wound Cleansing: Wound #53 Right,Circumferential Lower Leg: Cleanse wound with mild  soap and water Wound #54 Left,Circumferential Lower Leg: Cleanse wound with mild soap and water Wound #55 Right,Proximal,Lateral Lower Leg: Cleanse wound with mild soap and water Anesthetic: Wound #53 Right,Circumferential Lower Leg: Topical Lidocaine 4% cream applied to wound bed prior to debridement Wound #54 Left,Circumferential Lower Leg: Topical Lidocaine 4% cream applied to wound bed prior to debridement Blixt, Colbie K. ([id number]) Wound #55 Right,Proximal,Lateral Lower Leg: Topical Lidocaine 4% cream applied to wound bed prior to debridement Primary Wound Dressing: Wound #53 Right,Circumferential Lower Leg: Other: - siltec and siltec sorbact Wound #54 Left,Circumferential Lower Leg: Other: - siltec and siltec sorbact Wound #55 Right,Proximal,Lateral Lower Leg: Other: - siltec and siltec sorbact Secondary Dressing: Wound #53 Right,Circumferential Lower Leg: ABD pad Wound #54 Left,Circumferential Lower Leg: ABD pad Wound #55 Right,Proximal,Lateral Lower Leg: ABD pad Dressing Change Frequency: Wound #53 Right,Circumferential Lower Leg: Change dressing every week Wound #54 Left,Circumferential Lower Leg: Change dressing every week Wound #55 Right,Proximal,Lateral Lower Leg: Change dressing every week Follow-up Appointments: Wound #53 Right,Circumferential Lower Leg: Return Appointment in 1 week. Wound #54 Left,Circumferential Lower Leg: Return Appointment in 1 week. Wound #55 Right,Proximal,Lateral Lower Leg: Return Appointment in 1 week. Edema Control: Wound #53 Right,Circumferential Lower Leg: 4 Layer Compression System - Bilateral Wound #54 Left,Circumferential Lower Leg: 4 Layer Compression System - Bilateral Wound #55 Right,Proximal,Lateral Lower Leg: 4 Layer Compression System - Bilateral We will use Sorbact and foam, under the 4 layer Profore wrap and I  have urged her to keep her compression stockings ready in case her ulcers are healed out by next  week. She promises to come on a weekly basis as we are using compression wraps. DALEA, HENLY ([id number]) Electronic Signature(s) Signed: 04/30/2015 3:35:59 PM By: Christin Fudge MD, FACS Entered By: Christin Fudge on 04/30/2015 15:35:59 [name] ([id number]) -------------------------------------------------------------------------------- SuperBill Details Patient Name: [name] Date of Service: 04/30/2015 Medical Record Number: [id number] Patient Account Number: [id number] Date of Birth/Sex: [birth year] (59 y.o. Female) Treating RN: Montey Hora Primary Care Physician: [name] Other Clinician: Referring Physician: [name] Treating Physician/Extender: Frann Rider in Treatment: 109 Diagnosis Coding ICD-9 Codes Code Description 457.1 Other Lymphedema - excludes congenital, eyelid or vulva 459.33 Chronic venous hypertension with ulcer and inflammation 707.12 Ulcer of lower limbs, except pressure ulcer; Ulcer of calf ICD-10 Codes Code Description I89.0 Lymphedema, not elsewhere classified E66.9 Obesity, unspecified E11.40 Type 2 diabetes mellitus with diabetic neuropathy, unspecified R26.89 Other abnormalities of gait and mobility 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 Physician Procedures CPT4: Description Modifier Quantity Code [id number] - WC PHYS LEVEL 3 - EST PT 1 ICD-10 Description Diagnosis I89.0 Lymphedema, not elsewhere classified E66.9 Obesity, unspecified E11.40 Type 2 diabetes mellitus with diabetic neuropathy, unspecified  I83.212 Varicose veins of  right lower extremity with both ulcer of calf and inflammation SETAREH, LOIACONO ([id number]) Electronic Signature(s) Signed: 04/30/2015 3:36:17 PM By: Christin Fudge MD, FACS Entered By: Christin Fudge on 04/30/2015 15:36:16

## 2015-05-01 NOTE — Progress Notes (Signed)
EIZABETH, ZACARIAS ([id number]) Visit Report for 04/30/2015 Arrival Information Details Patient Name: [name], [name]. Date of Service: 04/30/2015 3:00 PM Medical Record Number: [id number] Patient Account Number: [id number] Date of Birth/Sex: July 28, 1955 (59 y.o. Female) Treating RN: Montey Hora Primary Care Physician: [name] Other Clinician: Referring Physician: [name] Treating Physician/Extender: Frann Rider in Treatment: 100 Visit Information History Since Last Visit Added or deleted any medications: No Patient Arrived: Wheel Chair Any new allergies or adverse reactions: No Arrival Time: 15:05 Had a fall or experienced change in No Accompanied By: self activities of daily living that may affect Transfer Assistance: None risk of falls: Patient Identification Verified: Yes Signs or symptoms of abuse/neglect since last No Secondary Verification Process Yes visito Completed: Hospitalized since last visit: No Patient Requires Transmission- No Pain Present Now: No Based Precautions: Patient Has Alerts: Yes Patient Alerts: Patient on Blood Thinner Electronic Signature(s) Signed: 04/30/2015 5:32:28 PM By: Montey Hora Entered By: Montey Hora on 04/30/2015 15:06:27 [name] ([id number]) -------------------------------------------------------------------------------- Encounter Discharge Information Details Patient Name: [name]. Date of Service: 04/30/2015 3:00 PM Medical Record Number: [id number] Patient Account Number: [id number] Date of Birth/Sex: [birth year] (59 y.o. Female) Treating RN: Montey Hora Primary Care Physician: [name] Other Clinician: Referring Physician: [name] Treating Physician/Extender: Frann Rider in Treatment: 109 Encounter Discharge Information Items Discharge Pain Level: 0 Discharge Condition: Stable Ambulatory Status: Wheelchair Discharge Destination: Home Transportation:  Private Auto Accompanied By: self Schedule Follow-up Appointment: Yes Medication Reconciliation completed and provided to Patient/Care No Aydin Cavalieri: Provided on Clinical Summary of Care: 04/30/2015 Form Type Recipient Paper Patient GI Electronic Signature(s) Signed: 04/30/2015 4:57:38 PM By: Montey Hora Previous Signature: 04/30/2015 3:49:26 PM Version By: Ruthine Dose Entered By: Montey Hora on 04/30/2015 16:57:38 [name] ([id number]) -------------------------------------------------------------------------------- Lower Extremity Assessment Details Patient Name: [name] Date of Service: 04/30/2015 3:00 PM Medical Record Number: [id number] Patient Account Number: [id number] Date of Birth/Sex: 1955-08-18 (59 y.o. Female) Treating RN: Montey Hora Primary Care Physician: [name] Other Clinician: Referring Physician: [name] Treating Physician/Extender: Frann Rider in Treatment: 109 Edema Assessment Assessed: Shirlyn Goltz: No] [Right: No] Edema: [Left: Yes] [Right: Yes] Calf Left: Right: Point of Measurement: 24 cm From Medial Instep 42.4 cm 38.5 cm Ankle Left: Right: Point of Measurement: 8 cm From Medial Instep 24.7 cm 24 cm Vascular Assessment Pulses: Posterior Tibial Dorsalis Pedis Palpable: [Left:Yes] [Right:Yes] Extremity colors, hair growth, and conditions: Extremity Color: [Left:Hyperpigmented] [Right:Hyperpigmented] Hair Growth on Extremity: [Left:No] [Right:No] Temperature of Extremity: [Left:Warm] [Right:Warm] Capillary Refill: [Left:< 3 seconds] [Right:< 3 seconds] Toe Nail Assessment Left: Right: Thick: Yes Yes Discolored: Yes Yes Deformed: Yes Yes Improper Length and Hygiene: No No Electronic Signature(s) Signed: 04/30/2015 5:32:28 PM By: Montey Hora Entered By: Montey Hora on 04/30/2015 15:17:55 [name]  ([id number]) -------------------------------------------------------------------------------- Multi Wound Chart Details Patient Name: [name]. Date of Service: 04/30/2015 3:00 PM Medical Record Number: [id number] Patient Account Number: [id number] Date of Birth/Sex: 1956-04-19 (59 y.o. Female) Treating RN: Montey Hora Primary Care Physician: [name] Other Clinician: Referring Physician: [name] Treating Physician/Extender: Frann Rider in Treatment: 109 Vital Signs Height(in): 65 Pulse(bpm): 55 Weight(lbs): Blood Pressure 171/55 (mmHg): Body Mass Index(BMI): Temperature(F): 98.3 Respiratory Rate 18 (breaths/min): Photos: [53:No Photos] [54:No Photos] [55:No Photos] Wound Location: [53:Right Lower Leg - Circumfernential] [54:Left Lower Leg - Circumfernential] [55:Right, Proximal, Lateral Lower Leg] Wounding Event: [53:Gradually Appeared] [54:Gradually Appeared] [55:Blister] Primary Etiology: [53:Lymphedema] [54:Lymphedema] [55:Lymphedema] Comorbid History: [53:Lymphedema, Hypertension, Type II Diabetes] [54:Lymphedema,  Hypertension, Type II Diabetes] [55:Lymphedema, Hypertension, Type II Diabetes] Date Acquired: [53:02/21/2015] [54:03/26/2015] [55:04/30/2015] Weeks of Treatment: [53:9] [54:4] [55:0] Wound Status: [53:Open] [54:Open] [55:Open] Clustered Wound: [53:Yes] [54:Yes] [55:Yes] Measurements L x W x D 1x5x0.1 [54:9.5x7.5x0.1] [55:11x3x0.1] (cm) Area (cm) : [53:3.927] [54:55.96] [55:25.918] Volume (cm) : [53:0.393] [54:5.596] [55:2.592] % Reduction in Area: [53:98.90%] [54:42.50%] [55:N/A] % Reduction in Volume: 98.90% [54:42.50%] [55:N/A] Classification: [53:Partial Thickness] [54:Partial Thickness] [55:Partial Thickness] HBO Classification: [53:Grade 1] [54:Grade 1] [55:Grade 1] Exudate Amount: [53:Small] [54:Medium] [55:Large] Exudate Type: [53:Serous] [54:Serous] [55:Serous] Exudate Color: [53:amber] [54:amber] [55:amber] Wound  Margin: [53:Flat and Intact] [54:Flat and Intact] [55:Flat and Intact] Granulation Amount: [53:Large (67-100%)] [54:Large (67-100%)] [55:Large (67-100%)] Granulation Quality: [53:Red] [54:Red] [55:Pink] Necrotic Amount: [53:None Present (0%)] [54:None Present (0%)] [55:None Present (0%)] Exposed Structures: [53:Fascia: No Fat: No Tendon: No] [54:Fascia: No Fat: No Tendon: No] [55:Fascia: No Fat: No Tendon: No] Muscle: No Muscle: No Muscle: No Joint: No Joint: No Joint: No Bone: No Bone: No Bone: No Limited to Skin Limited to Skin Limited to Skin Breakdown Breakdown Breakdown Epithelialization: Large (67-100%) Medium (34-66%) Medium (34-66%) Periwound Skin Texture: Edema: No Edema: No Edema: No Excoriation: No Excoriation: No Excoriation: No Induration: No Induration: No Induration: No Callus: No Callus: No Callus: No Crepitus: No Crepitus: No Crepitus: No Fluctuance: No Fluctuance: No Fluctuance: No Friable: No Friable: No Friable: No Rash: No Rash: No Rash: No Scarring: No Scarring: No Scarring: No Periwound Skin Maceration: No Maceration: No Maceration: No Moisture: Moist: No Moist: No Moist: No Dry/Scaly: No Dry/Scaly: No Dry/Scaly: No Periwound Skin Color: Atrophie Blanche: No Atrophie Blanche: No Atrophie Blanche: No Cyanosis: No Cyanosis: No Cyanosis: No Ecchymosis: No Ecchymosis: No Ecchymosis: No Erythema: No Erythema: No Erythema: No Hemosiderin Staining: No Hemosiderin Staining: No Hemosiderin Staining: No Mottled: No Mottled: No Mottled: No Pallor: No Pallor: No Pallor: No Rubor: No Rubor: No Rubor: No Tenderness on No No Yes Palpation: Wound Preparation: Ulcer Cleansing: Other: Ulcer Cleansing: Other: Ulcer Cleansing: Other: soap and water soap and water soap and water Topical Anesthetic Topical Anesthetic Topical Anesthetic Applied: None Applied: None Applied: None Treatment Notes Electronic Signature(s) Signed:  04/30/2015 5:32:28 PM By: Montey Hora Entered By: Montey Hora on 04/30/2015 15:27:28 [name] ([id number]) -------------------------------------------------------------------------------- Multi-Disciplinary Care Plan Details Patient Name: [name]. Date of Service: 04/30/2015 3:00 PM Medical Record Number: [id number] Patient Account Number: [id number] Date of Birth/Sex: 1956/02/28 (59 y.o. Female) Treating RN: Montey Hora Primary Care Physician: [name] Other Clinician: Referring Physician: [name] Treating Physician/Extender: Frann Rider in Treatment: 109 Active Inactive Electronic Signature(s) Signed: 04/30/2015 5:32:28 PM By: Montey Hora Entered By: Montey Hora on 04/30/2015 15:26:15 [name] ([id number]) -------------------------------------------------------------------------------- Patient/Caregiver Education Details Patient Name: [name] Date of Service: 04/30/2015 3:00 PM Medical Record Number: [id number] Patient Account Number: [id number] Date of Birth/Gender: 1956/02/20 (59 y.o. Female) Treating RN: Montey Hora Primary Care Physician: [name] Other Clinician: Referring Physician: [name] Treating Physician/Extender: Frann Rider in Treatment: 109 Education Assessment Education Provided To: Patient Education Topics Provided Venous: Other: call if you cannot make your appointment and we will instruct you on removing your Handouts: wraps Methods: Explain/Verbal Responses: State content correctly Electronic Signature(s) Signed: 04/30/2015 4:58:11 PM By: Montey Hora Entered By: Montey Hora on 04/30/2015 16:58:11 [name] ([id number]) -------------------------------------------------------------------------------- Wound Assessment Details Patient Name: [name]. Date of Service: 04/30/2015 3:00 PM Medical Record Number: [id number] Patient Account  Number: [id number] Date of Birth/Sex: 07/06/1955 (59 y.o. Female) Treating  RN: Montey Hora Primary Care Physician: [name] Other Clinician: Referring Physician: [name] Treating Physician/Extender: Frann Rider in Treatment: 109 Wound Status Wound Number: 53 Primary Lymphedema Etiology: Wound Location: Right Lower Leg - Circumfernential Wound Status: Open Wounding Event: Gradually Appeared Comorbid Lymphedema, Hypertension, Type II History: Diabetes Date Acquired: 02/21/2015 Weeks Of Treatment: 9 Clustered Wound: Yes Photos Photo Uploaded By: Montey Hora on 04/30/2015 16:53:35 Wound Measurements Length: (cm) 1 Width: (cm) 5 Depth: (cm) 0.1 Area: (cm) 3.927 Volume: (cm) 0.393 % Reduction in Area: 98.9% % Reduction in Volume: 98.9% Epithelialization: Large (67-100%) Tunneling: No Undermining: No Wound Description Classification: Partial Thickness Foul Odor A Diabetic Severity (Wagner): Grade 1 Wound Margin: Flat and Intact Exudate Amount: Small Exudate Type: Serous Exudate Color: amber fter Cleansing: No Wound Bed Granulation Amount: Large (67-100%) Exposed Structure Granulation Quality: Red Fascia Exposed: No Pyles, Denica K. ([id number]) Necrotic Amount: None Present (0%) Fat Layer Exposed: No Tendon Exposed: No Muscle Exposed: No Joint Exposed: No Bone Exposed: No Limited to Skin Breakdown Periwound Skin Texture Texture Color No Abnormalities Noted: No No Abnormalities Noted: No Callus: No Atrophie Blanche: No Crepitus: No Cyanosis: No Excoriation: No Ecchymosis: No Fluctuance: No Erythema: No Friable: No Hemosiderin Staining: No Induration: No Mottled: No Localized Edema: No Pallor: No Rash: No Rubor: No Scarring: No Moisture No Abnormalities Noted: No Dry / Scaly: No Maceration: No Moist: No Wound Preparation Ulcer Cleansing: Other: soap and water, Topical Anesthetic Applied: None Treatment Notes Wound #53  (Right, Circumferential Lower Leg) 1. Cleansed with: Cleanse wound with antibacterial soap and water 4. Dressing Applied: Other dressing (specify in notes) 5. Secondary Dressing Applied ABD Pad 7. Secured with Tape 4 Layer Compression System - Bilateral Notes sorbact Electronic Signature(s) Signed: 04/30/2015 5:32:28 PM By: Montey Hora Entered By: Montey Hora on 04/30/2015 15:25:09 [name] ([name] ([id number]) -------------------------------------------------------------------------------- Wound Assessment Details Patient Name: [name]. Date of Service: 04/30/2015 3:00 PM Medical Record Number: [id number] Patient Account Number: [id number] Date of Birth/Sex: 05-04-1956 (59 y.o. Female) Treating RN: Montey Hora Primary Care Physician: [name] Other Clinician: Referring Physician: [name] Treating Physician/Extender: Frann Rider in Treatment: 109 Wound Status Wound Number: 54 Primary Lymphedema Etiology: Wound Location: Left Lower Leg - Circumfernential Wound Status: Open Wounding Event: Gradually Appeared Comorbid Lymphedema, Hypertension, Type II History: Diabetes Date Acquired: 03/26/2015 Weeks Of Treatment: 4 Clustered Wound: Yes Photos Photo Uploaded By: Montey Hora on 04/30/2015 16:54:16 Wound Measurements Length: (cm) 9.5 Width: (cm) 7.5 Depth: (cm) 0.1 Area: (cm) 55.96 Volume: (cm) 5.596 % Reduction in Area: 42.5% % Reduction in Volume: 42.5% Epithelialization: Medium (34-66%) Tunneling: No Undermining: No Wound Description Classification: Partial Thickness Foul Odor A Diabetic Severity (Wagner): Grade 1 Wound Margin: Flat and Intact Exudate Amount: Medium Exudate Type: Serous Exudate Color: amber fter Cleansing: No Wound Bed Granulation Amount: Large (67-100%) Exposed Structure Granulation Quality: Red Fascia Exposed: No Blough, Maitlyn K. ([id number]) Necrotic Amount:  None Present (0%) Fat Layer Exposed: No Tendon Exposed: No Muscle Exposed: No Joint Exposed: No Bone Exposed: No Limited to Skin Breakdown Periwound Skin Texture Texture Color No Abnormalities Noted: No No Abnormalities Noted: No Callus: No Atrophie Blanche: No Crepitus: No Cyanosis: No Excoriation: No Ecchymosis: No Fluctuance: No Erythema: No Friable: No Hemosiderin Staining: No Induration: No Mottled: No Localized Edema: No Pallor: No Rash: No Rubor: No Scarring: No Moisture No Abnormalities Noted: No Dry / Scaly: No Maceration: No Moist: No Wound Preparation Ulcer Cleansing: Other: soap and  water, Topical Anesthetic Applied: None Treatment Notes Wound #54 (Left, Circumferential Lower Leg) 1. Cleansed with: Cleanse wound with antibacterial soap and water 4. Dressing Applied: Other dressing (specify in notes) 5. Secondary Dressing Applied ABD Pad 7. Secured with Tape 4 Layer Compression System - Bilateral Notes sorbact Electronic Signature(s) Signed: 04/30/2015 5:32:28 PM By: Montey Hora Entered By: Montey Hora on 04/30/2015 15:25:48 [name] ([name], [name] ([id number]) -------------------------------------------------------------------------------- Wound Assessment Details Patient Name: [name]. Date of Service: 04/30/2015 3:00 PM Medical Record Number: [id number] Patient Account Number: [id number] Date of Birth/Sex: [birth year] (59 y.o. Female) Treating RN: Montey Hora Primary Care Physician: [name] Other Clinician: Referring Physician: [name] Treating Physician/Extender: Frann Rider in Treatment: 109 Wound Status Wound Number: 55 Primary Lymphedema Etiology: Wound Location: Right, Proximal, Lateral Lower Leg Wound Status: Open Wounding Event: Blister Comorbid Lymphedema, Hypertension, Type II History: Diabetes Date Acquired: 04/30/2015 Weeks Of Treatment: 0 Clustered Wound:  Yes Photos Photo Uploaded By: Montey Hora on 04/30/2015 16:53:35 Wound Measurements Length: (cm) 11 Width: (cm) 3 Depth: (cm) 0.1 Area: (cm) 25.918 Volume: (cm) 2.592 % Reduction in Area: % Reduction in Volume: Epithelialization: Medium (34-66%) Tunneling: No Undermining: No Wound Description Classification: Partial Thickness Foul Odor A Diabetic Severity (Wagner): Grade 1 Wound Margin: Flat and Intact Exudate Amount: Large Exudate Type: Serous Exudate Color: amber fter Cleansing: No Wound Bed Granulation Amount: Large (67-100%) Exposed Structure Granulation Quality: Pink Fascia Exposed: No Supple, Katherleen K. ([id number]) Necrotic Amount: None Present (0%) Fat Layer Exposed: No Tendon Exposed: No Muscle Exposed: No Joint Exposed: No Bone Exposed: No Limited to Skin Breakdown Periwound Skin Texture Texture Color No Abnormalities Noted: No No Abnormalities Noted: No Callus: No Atrophie Blanche: No Crepitus: No Cyanosis: No Excoriation: No Ecchymosis: No Fluctuance: No Erythema: No Friable: No Hemosiderin Staining: No Induration: No Mottled: No Localized Edema: No Pallor: No Rash: No Rubor: No Scarring: No Temperature / Pain Moisture Tenderness on Palpation: Yes No Abnormalities Noted: No Dry / Scaly: No Maceration: No Moist: No Wound Preparation Ulcer Cleansing: Other: soap and water, Topical Anesthetic Applied: None Treatment Notes Wound #55 (Right, Proximal, Lateral Lower Leg) 1. Cleansed with: Cleanse wound with antibacterial soap and water 4. Dressing Applied: Other dressing (specify in notes) 5. Secondary Dressing Applied ABD Pad 7. Secured with Tape 4 Layer Compression System - Bilateral Notes sorbact Electronic Signature(s) Signed: 04/30/2015 5:32:28 PM By: Montey Hora Entered By: Montey Hora on 04/30/2015 15:24:12 [name] ([name]  ([id number]) -------------------------------------------------------------------------------- Vitals Details Patient Name: [name] Date of Service: 04/30/2015 3:00 PM Medical Record Number: [id number] Patient Account Number: [id number] Date of Birth/Sex: [birth year] (59 y.o. Female) Treating RN: Montey Hora Primary Care Physician: [name] Other Clinician: Referring Physician: [name] Treating Physician/Extender: Frann Rider in Treatment: 109 Vital Signs Time Taken: 15:08 Temperature (F): 98.3 Height (in): 65 Pulse (bpm): 55 Respiratory Rate (breaths/min): 18 Blood Pressure (mmHg): 171/55 Reference Range: 80 - 120 mg / dl Electronic Signature(s) Signed: 04/30/2015 5:32:28 PM By: Montey Hora Entered By: Montey Hora on 04/30/2015 15:08:35

## 2015-05-05 ENCOUNTER — Encounter: Payer: Managed Care, Other (non HMO) | Admitting: Surgery

## 2015-05-05 DIAGNOSIS — E11622 Type 2 diabetes mellitus with other skin ulcer: Secondary | ICD-10-CM | POA: Diagnosis not present

## 2015-05-06 NOTE — Progress Notes (Signed)
MIDORI, SLACUM ([id number]) Visit Report for 05/05/2015 Arrival Information Details Patient Name: [name], [name]. Date of Service: 05/05/2015 2:00 PM Medical Record Number: [id number] Patient Account Number: [id number] Date of Birth/Sex: 21-Nov-1955 (59 y.o. Female) Treating RN: Montey Hora Primary Care Physician: [name] Other Clinician: Referring Physician: [name] Treating Physician/Extender: Frann Rider in Treatment: 62 Visit Information History Since Last Visit Added or deleted any medications: No Patient Arrived: Wheel Chair Any new allergies or adverse reactions: No Arrival Time: 14:16 Had a fall or experienced change in No Accompanied By: self activities of daily living that may affect Transfer Assistance: None risk of falls: Patient Identification Verified: Yes Signs or symptoms of abuse/neglect since last No Secondary Verification Process Yes visito Completed: Hospitalized since last visit: No Patient Requires Transmission- No Pain Present Now: No Based Precautions: Patient Has Alerts: Yes Patient Alerts: Patient on Blood Thinner Electronic Signature(s) Signed: 05/05/2015 5:19:50 PM By: Montey Hora Entered By: Montey Hora on 05/05/2015 14:17:19 [name] ([id number]) -------------------------------------------------------------------------------- Encounter Discharge Information Details Patient Name: [name]. Date of Service: 05/05/2015 2:00 PM Medical Record Number: [id number] Patient Account Number: [id number] Date of Birth/Sex: [birth year] (59 y.o. Female) Treating RN: Montey Hora Primary Care Physician: [name] Other Clinician: Referring Physician: [name] Treating Physician/Extender: Frann Rider in Treatment: 109 Encounter Discharge Information Items Discharge Pain Level: 0 Discharge Condition: Stable Ambulatory Status: Wheelchair Discharge Destination: Home Transportation:  Private Auto Accompanied By: self Schedule Follow-up Appointment: Yes Medication Reconciliation completed and provided to Patient/Care No Adeleigh Barletta: Provided on Clinical Summary of Care: 05/05/2015 Form Type Recipient Paper Patient GI Electronic Signature(s) Signed: 05/05/2015 3:32:24 PM By: Montey Hora Previous Signature: 05/05/2015 3:14:12 PM Version By: Ruthine Dose Entered By: Montey Hora on 05/05/2015 15:32:24 [name] ([id number]) -------------------------------------------------------------------------------- Lower Extremity Assessment Details Patient Name: [name]. Date of Service: 05/05/2015 2:00 PM Medical Record Number: [id number] Patient Account Number: [id number] Date of Birth/Sex: [birth year] (59 y.o. Female) Treating RN: Montey Hora Primary Care Physician: [name] Other Clinician: Referring Physician: [name] Treating Physician/Extender: Frann Rider in Treatment: 109 Edema Assessment Assessed: Shirlyn Goltz: No] [Right: No] Edema: [Left: Yes] [Right: Yes] Calf Left: Right: Point of Measurement: 24 cm From Medial Instep 41.6 cm 36.5 cm Ankle Left: Right: Point of Measurement: 8 cm From Medial Instep 24 cm 22.4 cm Vascular Assessment Pulses: Posterior Tibial Dorsalis Pedis Palpable: [Left:Yes] [Right:Yes] Extremity colors, hair growth, and conditions: Extremity Color: [Left:Hyperpigmented] [Right:Hyperpigmented] Hair Growth on Extremity: [Left:No] [Right:No] Temperature of Extremity: [Left:Warm] [Right:Warm] Capillary Refill: [Left:< 3 seconds] [Right:< 3 seconds] Toe Nail Assessment Left: Right: Thick: Yes Yes Discolored: Yes Yes Deformed: Yes Yes Improper Length and Hygiene: No No Electronic Signature(s) Signed: 05/05/2015 5:19:50 PM By: Montey Hora Entered By: Montey Hora on 05/05/2015 14:37:42 [name], [name]  ([id number]) -------------------------------------------------------------------------------- Multi Wound Chart Details Patient Name: [name]. Date of Service: 05/05/2015 2:00 PM Medical Record Number: [id number] Patient Account Number: [id number] Date of Birth/Sex: [birth year] (59 y.o. Female) Treating RN: Montey Hora Primary Care Physician: [name] Other Clinician: Referring Physician: [name] Treating Physician/Extender: Frann Rider in Treatment: 109 Vital Signs Height(in): 65 Pulse(bpm): 57 Weight(lbs): Blood Pressure 165/48 (mmHg): Body Mass Index(BMI): Temperature(F): 98.2 Respiratory Rate 18 (breaths/min): Photos: [53:No Photos] [54:No Photos] [55:No Photos] Wound Location: [53:Right, Circumferential Lower Leg] [54:Left, Circumferential Lower Leg] [55:Right Lower Leg - Lateral, Proximal] Wounding Event: [53:Gradually Appeared] [54:Gradually Appeared] [55:Blister] Primary Etiology: [53:Lymphedema] [54:Lymphedema] [55:Lymphedema] Comorbid History: [53:N/A] [54:N/A] [55:Lymphedema, Hypertension, Type II Diabetes]  Date Acquired: [53:02/21/2015] [54:03/26/2015] [55:04/30/2015] Weeks of Treatment: [53:9] [54:4] [55:0] Wound Status: [53:Healed - Epithelialized] [54:Healed - Epithelialized] [55:Open] Clustered Wound: [53:Yes] [54:Yes] [55:Yes] Measurements L x W x D 0x0x0 [54:0x0x0] [55:0.8x6x0.1] (cm) Area (cm) : [53:0] [54:0] [55:3.77] Volume (cm) : [53:0] [54:0] [55:0.377] % Reduction in Area: [53:100.00%] [54:100.00%] [55:85.50%] % Reduction in Volume: 100.00% [54:100.00%] [55:85.50%] Classification: [53:Partial Thickness] [54:Partial Thickness] [55:Partial Thickness] HBO Classification: [53:N/A] [54:N/A] [55:Grade 1] Exudate Amount: [53:N/A] [54:N/A] [55:Large] Exudate Type: [53:N/A] [54:N/A] [55:Serous] Exudate Color: [53:N/A] [54:N/A] [55:amber] Wound Margin: [53:N/A] [54:N/A] [55:Flat and Intact] Granulation Amount: [53:N/A] [54:N/A]  [55:Large (67-100%)] Granulation Quality: [53:N/A] [54:N/A] [55:Pink] Necrotic Amount: [53:N/A] [54:N/A] [55:None Present (0%)] Epithelialization: [53:N/A] [54:N/A] [55:Medium (34-66%)] Periwound Skin Texture: No Abnormalities Noted [54:No Abnormalities Noted] Edema: No Excoriation: No Induration: No Callus: No Crepitus: No Fluctuance: No Friable: No Rash: No Scarring: No Periwound Skin No Abnormalities Noted No Abnormalities Noted Maceration: No Moisture: Moist: No Dry/Scaly: No Periwound Skin Color: No Abnormalities Noted No Abnormalities Noted Atrophie Blanche: No Cyanosis: No Ecchymosis: No Erythema: No Hemosiderin Staining: No Mottled: No Pallor: No Rubor: No Tenderness on No No Yes Palpation: Wound Preparation: N/A N/A Ulcer Cleansing: Other: soap and water Topical Anesthetic Applied: None Treatment Notes Electronic Signature(s) Signed: 05/05/2015 5:19:50 PM By: Montey Hora Entered By: Montey Hora on 05/05/2015 14:42:40 [name] ([id number]) -------------------------------------------------------------------------------- Multi-Disciplinary Care Plan Details Patient Name: [name]. Date of Service: 05/05/2015 2:00 PM Medical Record Number: [id number] Patient Account Number: [id number] Date of Birth/Sex: Nov 09, 1955 (59 y.o. Female) Treating RN: Montey Hora Primary Care Physician: [name] Other Clinician: Referring Physician: [name] Treating Physician/Extender: Frann Rider in Treatment: 109 Active Inactive Electronic Signature(s) Signed: 05/05/2015 5:19:50 PM By: Montey Hora Entered By: Montey Hora on 05/05/2015 14:42:27 [name] ([id number]) -------------------------------------------------------------------------------- Patient/Caregiver Education Details Patient Name: [name] Date of Service: 05/05/2015 2:00 PM Medical Record Number: [id number] Patient Account Number:  [id number] Date of Birth/Gender: 1955/08/14 (59 y.o. Female) Treating RN: Montey Hora Primary Care Physician: [name] Other Clinician: Referring Physician: [name] Treating Physician/Extender: Frann Rider in Treatment: 109 Education Assessment Education Provided To: Patient Education Topics Provided Venous: Handouts: Other: must wear juxtalite on LLE Methods: Explain/Verbal Responses: State content correctly Electronic Signature(s) Signed: 05/05/2015 3:32:43 PM By: Montey Hora Entered By: Montey Hora on 05/05/2015 15:32:43 [name] ([id number]) -------------------------------------------------------------------------------- Wound Assessment Details Patient Name: [name]. Date of Service: 05/05/2015 2:00 PM Medical Record Number: [id number] Patient Account Number: [id number] Date of Birth/Sex: 1956/02/23 (59 y.o. Female) Treating RN: Montey Hora Primary Care Physician: [name] Other Clinician: Referring Physician: [name] Treating Physician/Extender: Frann Rider in Treatment: 109 Wound Status Wound Number: 53 Primary Etiology: Lymphedema Wound Location: Right, Circumferential Lower Wound Status: Healed - Epithelialized Leg Wounding Event: Gradually Appeared Date Acquired: 02/21/2015 Weeks Of Treatment: 9 Clustered Wound: Yes Photos Photo Uploaded By: Montey Hora on 05/05/2015 16:56:20 Wound Measurements Length: (cm) 0 % Reduction i Width: (cm) 0 % Reduction i Depth: (cm) 0 Area: (cm) 0 Volume: (cm) 0 n Area: 100% n Volume: 100% Wound Description Classification: Partial Thickness Periwound Skin Texture Texture Color No Abnormalities Noted: No No Abnormalities Noted: No Moisture No Abnormalities Noted: No Electronic Signature(s) Signed: 05/05/2015 5:19:50 PM By: Margarita Grizzle, [name] ([id number]) Entered By: Montey Hora on 05/05/2015 14:41:27 [name]  ([id number]) -------------------------------------------------------------------------------- Wound Assessment Details Patient Name: [name]. Date of Service: 05/05/2015 2:00 PM Medical Record Number: [id number] Patient Account Number: [id number] Date  of Birth/Sex: [birth year] (59 y.o. Female) Treating RN: Montey Hora Primary Care Physician: [name] Other Clinician: Referring Physician: [name] Treating Physician/Extender: Frann Rider in Treatment: 109 Wound Status Wound Number: 54 Primary Etiology: Lymphedema Wound Location: Left, Circumferential Lower Leg Wound Status: Healed - Epithelialized Wounding Event: Gradually Appeared Date Acquired: 03/26/2015 Weeks Of Treatment: 4 Clustered Wound: Yes Photos Photo Uploaded By: Montey Hora on 05/05/2015 16:56:21 Wound Measurements Length: (cm) 0 Width: (cm) 0 Depth: (cm) 0 Area: (cm) 0 Volume: (cm) 0 % Reduction in Area: 100% % Reduction in Volume: 100% Wound Description Classification: Partial Thickness Periwound Skin Texture Texture Color No Abnormalities Noted: No No Abnormalities Noted: No Moisture No Abnormalities Noted: No Electronic Signature(s) Signed: 05/05/2015 5:19:50 PM By: Margarita Grizzle, [name] ([id number]) Entered By: Montey Hora on 05/05/2015 14:41:27 [name] ([id number]) -------------------------------------------------------------------------------- Wound Assessment Details Patient Name: [name]. Date of Service: 05/05/2015 2:00 PM Medical Record Number: [id number] Patient Account Number: [id number] Date of Birth/Sex: Mar 17, 1956 (59 y.o. Female) Treating RN: Montey Hora Primary Care Physician: [name] Other Clinician: Referring Physician: [name] Treating Physician/Extender: Frann Rider in Treatment: 109 Wound Status Wound Number: 55 Primary Lymphedema Etiology: Wound Location: Right Lower Leg -  Lateral, Proximal Wound Status: Open Wounding Event: Blister Comorbid Lymphedema, Hypertension, Type II History: Diabetes Date Acquired: 04/30/2015 Weeks Of Treatment: 0 Clustered Wound: Yes Photos Photo Uploaded By: Montey Hora on 05/05/2015 16:56:35 Wound Measurements Length: (cm) 0.8 Width: (cm) 6 Depth: (cm) 0.1 Area: (cm) 3.77 Volume: (cm) 0.377 % Reduction in Area: 85.5% % Reduction in Volume: 85.5% Epithelialization: Medium (34-66%) Tunneling: No Undermining: No Wound Description Classification: Partial Thickness Foul Odor A Diabetic Severity (Wagner): Grade 1 Wound Margin: Flat and Intact Exudate Amount: Large Exudate Type: Serous Exudate Color: amber fter Cleansing: No Wound Bed Granulation Amount: Large (67-100%) Exposed Structure Granulation Quality: Pink Fascia Exposed: No [name], [name] ([id number]) Necrotic Amount: None Present (0%) Fat Layer Exposed: No Tendon Exposed: No Muscle Exposed: No Joint Exposed: No Bone Exposed: No Limited to Skin Breakdown Periwound Skin Texture Texture Color No Abnormalities Noted: No No Abnormalities Noted: No Callus: No Atrophie Blanche: No Crepitus: No Cyanosis: No Excoriation: No Ecchymosis: No Fluctuance: No Erythema: No Friable: No Hemosiderin Staining: No Induration: No Mottled: No Localized Edema: No Pallor: No Rash: No Rubor: No Scarring: No Temperature / Pain Moisture Tenderness on Palpation: Yes No Abnormalities Noted: No Dry / Scaly: No Maceration: No Moist: No Wound Preparation Ulcer Cleansing: Other: soap and water, Topical Anesthetic Applied: None Treatment Notes Wound #55 (Right, Proximal, Lateral Lower Leg) 1. Cleansed with: Cleanse wound with antibacterial soap and water 4. Dressing Applied: Aquacel Ag 5. Secondary Dressing Applied ABD Pad 7. Secured with 4-Layer Compression System - Right Lower Extremity Notes tubigrip on LLE until patient gets home and can apply  juxtalite Electronic Signature(s) Signed: 05/05/2015 5:19:50 PM By: Montey Hora Entered By: Montey Hora on 05/05/2015 14:41:58 [name] ([id number]) [name] ([id number]) -------------------------------------------------------------------------------- Vitals Details Patient Name: [name]. Date of Service: 05/05/2015 2:00 PM Medical Record Number: [id number] Patient Account Number: [id number] Date of Birth/Sex: 10-24-1955 (59 y.o. Female) Treating RN: Montey Hora Primary Care Physician: [name] Other Clinician: Referring Physician: [name] Treating Physician/Extender: Frann Rider in Treatment: 109 Vital Signs Time Taken: 14:17 Temperature (F): 98.2 Height (in): 65 Pulse (bpm): 57 Respiratory Rate (breaths/min): 18 Blood Pressure (mmHg): 165/48 Reference Range: 80 - 120 mg / dl Electronic Signature(s) Signed: 05/05/2015 5:19:50  PM By: Montey Hora Entered By: Montey Hora on 05/05/2015 14:19:05

## 2015-05-06 NOTE — Progress Notes (Signed)
[name] ([id number]) Visit Report for 05/05/2015 Chief Complaint Document Details Patient Name: [name], [name]. Date of Service: 05/05/2015 2:00 PM Medical Record Number: [id number] Patient Account Number: [id number] Date of Birth/Sex: [birth year] (59 y.o. 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 Electronic Signature(s) Signed: 05/05/2015 3:21:37 PM By: Autumn Fudge MD, FACS Entered By: [name] on 05/05/2015 15:21:36 [name] ([id number]) -------------------------------------------------------------------------------- HPI Details Patient Name: [name] Date of Service: 05/05/2015 2:00 PM Medical Record Number: [id number] Patient Account Number: [id number] Date of Birth/Sex: 06-Mar-1956 (59 y.o. Female) Treating RN: [name] Primary Care Physician: [name] Other Clinician: Referring Physician: Otilio [name] Treating Physician/Extender: [name] in Treatment: 32 History of Present Illness HPI Description: 59yo w/ h/o BLE phlebolymphedema, type 2 DM, and obesity. No PVD. h/o chronic, recurrent BLE calf ulcers. Treated with 4 layer compression. Infrequently uses lymphedema pump. Bilateral lower extremity ulcerations healed as of June 2016. Fitted for custom compression stockings but not yet received. Awaiting lymphedema consult. She was transitioned to juxtalite compression garment on the left. Compression bandage applied to right leg on 02/25/2015. Patient has not followed up since that time, wearing same compression bandage. She returns to clinic today and has developed recurrent bilateral calf ulcerations. No significant pain. No fever or  chills. Moderate drainage. 04/07/2015 -- her hip surgery was canceled due to medical reasons and she is here without any compression today. I understand from Dr. Quay [name] note that she has been very noncompliant in the last time did not come back for over a month and had the same compression bandage. Today she does not have any compression and she says they were wet and hence she didn't wear them. 04/14/2015 -- she says she has got some transport organizer and will be here every week for regular appointments. She has been using her juxta lights at home. Electronic Signature(s) Signed: 05/05/2015 3:21:45 PM By: Autumn Fudge MD, FACS Entered By: [name] on 05/05/2015 15:21:45 [name] ([id number]) -------------------------------------------------------------------------------- Physical Exam Details Patient Name: [name] Date of Service: 05/05/2015 2:00 PM Medical Record Number: [id number] Patient Account Number: [id number] Date of Birth/Sex: Feb 07, 1956 (59 y.o. Female) Treating RN: [name] Primary Care Physician: [name] Other Clinician: Referring Physician: Otilio [name] Treating Physician/Extender: [name] in Treatment: 109 Constitutional . Pulse regular. Respirations normal and unlabored. Afebrile. . Eyes Nonicteric. Reactive to light. Ears, Nose, Mouth, and Throat Lips, teeth, and gums WNL.Marland Kitchen Moist mucosa without lesions . Neck supple and nontender. No palpable supraclavicular or cervical adenopathy. Normal sized without goiter. Respiratory WNL. No retractions.. Breath sounds WNL, No rubs, rales, rhonchi, or wheeze.. Cardiovascular Heart rhythm and rate regular, no murmur or gallop.. Pedal Pulses WNL. No clubbing, cyanosis or edema. Chest Breasts symmetical and no nipple discharge.. Breast tissue WNL, no masses, lumps, or tenderness.. Lymphatic No adneopathy. No adenopathy. No adenopathy. Musculoskeletal Adexa without tenderness  or enlargement.. Digits and nails w/o clubbing, cyanosis, infection, petechiae, ischemia, or inflammatory conditions.. Integumentary (Hair, Skin) No suspicious lesions. No crepitus or fluctuance. No peri-wound warmth or erythema. No masses.Marland Kitchen Psychiatric Judgement and insight Intact.. No evidence of depression, anxiety, or agitation.. Notes the right lower extremity near the calf has a small open area but the rest of it is completely healed. The left lower extremity looks great. Edema  of both lower extremities is minimal. Electronic Signature(s) Signed: 05/05/2015 3:22:24 PM By: Autumn Fudge MD, FACS Entered By: [name] on 05/05/2015 15:22:24 [name] ([id number]) -------------------------------------------------------------------------------- Physician Orders Details Patient Name: [name] Date of Service: 05/05/2015 2:00 PM Medical Record Number: [id number] Patient Account Number: [id number] Date of Birth/Sex: 13-Mar-1956 (59 y.o. Female) Treating RN: [name] Primary Care Physician: [name] Other Clinician: Referring Physician: Otilio [name] Treating Physician/Extender: [name] in Treatment: 67 Verbal / Phone Orders: Yes Clinician: Montey [name] Read Back and Verified: Yes Diagnosis Coding Wound Cleansing Wound #55 Right,Proximal,Lateral Lower Leg o Cleanse wound with mild soap and water Anesthetic Wound #55 Right,Proximal,Lateral Lower Leg o Topical Lidocaine 4% cream applied to wound bed prior to debridement Primary Wound Dressing Wound #55 Right,Proximal,Lateral Lower Leg o Aquacel Ag Secondary Dressing Wound #55 Right,Proximal,Lateral Lower Leg o ABD pad Dressing Change Frequency Wound #55 Right,Proximal,Lateral Lower Leg o Change dressing every week Follow-up Appointments Wound #55 Right,Proximal,Lateral Lower Leg o Return Appointment in 1 week. Edema Control Wound #55 Right,Proximal,Lateral Lower  Leg o 4-Layer Compression System - Right Lower Extremity Electronic Signature(s) Signed: 05/05/2015 4:34:10 PM By: Autumn Fudge MD, FACS Signed: 05/05/2015 5:19:50 PM By: [name] Entered By: [name] on 05/05/2015 14:52:05 [name] ([name] ([id number]) -------------------------------------------------------------------------------- Problem List Details Patient Name: [name]. Date of Service: 05/05/2015 2:00 PM Medical Record Number: [id number] Patient Account Number: [id number] Date of Birth/Sex: 1955-12-17 (59 y.o. Female) Treating RN: [name] Primary Care Physician: [name] Other Clinician: Referring Physician: Otilio [name] Treating Physician/Extender: [name] in Treatment: 109 Active Problems ICD-9 Encounter Code Description Active Date Diagnosis 457.1 Other Lymphedema - excludes congenital, eyelid or vulva 03/27/2013 Yes 459.33 Chronic venous hypertension with ulcer and inflammation 03/27/2013 Yes 707.12 Ulcer of lower limbs, except pressure ulcer; Ulcer of calf 03/27/2013 Yes ICD-10 Encounter Code Description Active Date Diagnosis I89.0 Lymphedema, not elsewhere classified 03/26/2014 Yes E66.9 Obesity, unspecified 03/26/2014 Yes E11.40 Type 2 diabetes mellitus with diabetic neuropathy, 01/14/2015 Yes unspecified R26.89 Other abnormalities of gait and mobility 01/14/2015 Yes 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 ([id number]) E11.622 Type 2 diabetes mellitus with other skin ulcer 02/25/2015 Yes Inactive Problems Resolved Problems ICD-10 Code Description Active Date Resolved Date E11.621 Type 2 diabetes mellitus with foot ulcer 10/08/2014 10/09/2014 L03.115 Cellulitis of right lower limb 02/25/2015 02/25/2015 L84  Corns and callosities 11/12/2014 11/12/2014 Electronic Signature(s) Signed: 05/05/2015 3:21:27 PM By: Autumn Fudge MD, FACS Entered By: [name] on 05/05/2015 15:21:26 [name] ([id number]) -------------------------------------------------------------------------------- Progress Note Details Patient Name: [name]. Date of Service: 05/05/2015 2:00 PM Medical Record Number: [id number] Patient Account Number: [id number] Date of Birth/Sex: February 14, 1956 (59 y.o. 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 History of Present Illness (HPI) 59yo w/ h/o BLE phlebolymphedema, type 2 DM, and obesity. No PVD. h/o chronic, recurrent BLE calf ulcers. Treated with 4 layer compression. Infrequently uses lymphedema pump. Bilateral lower extremity ulcerations healed as of June 2016. Fitted for custom compression stockings but not yet received. Awaiting lymphedema consult. She was transitioned to juxtalite compression garment on the left. Compression bandage applied  to right leg on 02/25/2015. Patient has not followed up since that time, wearing same compression bandage. She returns to clinic today and has developed recurrent bilateral calf ulcerations. No significant pain. No fever or chills. Moderate drainage. 04/07/2015 -- her hip surgery was canceled due to medical reasons and she is here without any compression today. I understand from Dr. Quay [name] note that she has been very noncompliant in the last time did not come back for over a month and had the same compression bandage. Today she does not have any compression and she says they were wet and hence she didn't wear them. 04/14/2015 -- she  says she has got some transport organizer and will be here every week for regular appointments. She has been using her juxta lights at home. Objective Constitutional Pulse regular. Respirations normal and unlabored. Afebrile. Vitals Time Taken: 2:17 PM, Height: 65 in, Temperature: 98.2 F, Pulse: 57 bpm, Respiratory Rate: 18 breaths/min, Blood Pressure: 165/48 mmHg. Eyes [name], [name] ([id number]) Nonicteric. Reactive to light. Ears, Nose, Mouth, and Throat Lips, teeth, and gums WNL.Marland Kitchen Moist mucosa without lesions . Neck supple and nontender. No palpable supraclavicular or cervical adenopathy. Normal sized without goiter. Respiratory WNL. No retractions.. Breath sounds WNL, No rubs, rales, rhonchi, or wheeze.. Cardiovascular Heart rhythm and rate regular, no murmur or gallop.. Pedal Pulses WNL. No clubbing, cyanosis or edema. Chest Breasts symmetical and no nipple discharge.. Breast tissue WNL, no masses, lumps, or tenderness.. Lymphatic No adneopathy. No adenopathy. No adenopathy. Musculoskeletal Adexa without tenderness or enlargement.. Digits and nails w/o clubbing, cyanosis, infection, petechiae, ischemia, or inflammatory conditions.Marland Kitchen Psychiatric Judgement and insight Intact.. No evidence of depression, anxiety, or agitation.. General Notes: the right lower extremity near the calf has a small open area but the rest of it is completely healed. The left lower extremity looks great. Edema of both lower extremities is minimal. Integumentary (Hair, Skin) No suspicious lesions. No crepitus or fluctuance. No peri-wound warmth or erythema. No masses.. Wound #53 status is Healed - Epithelialized. Original cause of wound was Gradually Appeared. The wound is located on the Right,Circumferential Lower Leg. The wound measures 0cm length x 0cm width x 0cm depth; 0cm^2 area and 0cm^3 volume. Wound #54 status is Healed - Epithelialized. Original cause of wound was Gradually Appeared. The  wound is located on the Left,Circumferential Lower Leg. The wound measures 0cm length x 0cm width x 0cm depth; 0cm^2 area and 0cm^3 volume. Wound #55 status is Open. Original cause of wound was Blister. The wound is located on the Right,Proximal,Lateral Lower Leg. The wound measures 0.8cm length x 6cm width x 0.1cm depth; 3.77cm^2 area and 0.377cm^3 volume. The wound is limited to skin breakdown. There is no tunneling or undermining noted. There is a large amount of serous drainage noted. The wound margin is flat and intact. There is large (67-100%) pink granulation within the wound bed. There is no necrotic tissue within the wound bed. The periwound skin appearance did not exhibit: Callus, Crepitus, Excoriation, Fluctuance, Friable, Induration, Localized Edema, Rash, Scarring, Dry/Scaly, Maceration, Moist, Atrophie Blanche, Cyanosis, Ecchymosis, Hemosiderin Staining, Mottled, Pallor, Rubor, Erythema. The periwound has [name], [name] ([id number]) tenderness on palpation. 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 We will put silver alginate on her right leg and use a 4-layer compression wrap. Her left leg she will go and use juxta light or sooner she reaches  home. I have urged her to keep her compression stockings ready in case her ulcers are healed out by next week. She promises to come on a weekly basis as we are using compression wraps. Plan Wound Cleansing: Wound #55 Right,Proximal,Lateral Lower Leg: Cleanse wound with mild soap and water Anesthetic: Wound #55 Right,Proximal,Lateral Lower Leg: Topical Lidocaine 4% cream applied to wound bed prior to debridement Primary Wound Dressing: Wound #55 Right,Proximal,Lateral Lower Leg: Aquacel Ag [name] ([id number]) Secondary Dressing: Wound #55 Right,Proximal,Lateral Lower Leg: ABD pad Dressing Change Frequency: Wound #55 Right,Proximal,Lateral Lower Leg: Change dressing every week Follow-up Appointments: Wound #55 Right,Proximal,Lateral Lower Leg: Return Appointment in 1 week. Edema Control: Wound #55 Right,Proximal,Lateral Lower Leg: 4-Layer Compression System - Right Lower Extremity The patient is very close to completely been healed out but has not brought in her juxta light stockings. We will put silver alginate on her right leg and use a 4-layer compression wrap. Her left leg she will go and use juxta light or sooner she reaches home. I have urged her to keep her compression stockings ready in case her ulcers are healed out by next week. She promises to come on a weekly basis as we are using compression wraps. Electronic Signature(s) Signed: 05/05/2015 3:23:41 PM By: Autumn Fudge MD, FACS Entered By: [name] on 05/05/2015 15:23:41 [name] ([id number]) -------------------------------------------------------------------------------- SuperBill Details Patient Name: [name] Date of Service: 05/05/2015 Medical Record Number: [id number] Patient Account Number: [id number] Date of Birth/Sex: 1955/10/14 (59 y.o. Female) Treating RN: [name] Primary Care Physician: [name] Other Clinician: Referring Physician: Otilio [name] Treating  Physician/Extender: [name] in Treatment: 109 Diagnosis Coding ICD-9 Codes Code Description 457.1 Other Lymphedema - excludes congenital, eyelid or vulva 459.33 Chronic venous hypertension with ulcer and inflammation 707.12 Ulcer of lower limbs, except pressure ulcer; Ulcer of calf ICD-10 Codes Code Description I89.0 Lymphedema, not elsewhere classified E66.9 Obesity, unspecified E11.40 Type 2 diabetes mellitus with diabetic neuropathy, unspecified R26.89 Other abnormalities of gait and mobility 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 I83.222 Varicose veins of left lower extremity with both ulcer of calf and inflammation E11.622 Type 2 diabetes mellitus with other skin ulcer Facility Procedures CPT4: Description Modifier Quantity Code [id number] (Facility Use Only) [id number] - APPLY Power COMPRS LWR RT 1 LEG Physician Procedures CPT4: Description Modifier Quantity Code [id number] - WC PHYS LEVEL 3 - EST PT 1 ICD-10 Description Diagnosis I89.0 Lymphedema, not elsewhere classified 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

## 2015-05-07 ENCOUNTER — Other Ambulatory Visit: Payer: Self-pay

## 2015-05-07 ENCOUNTER — Encounter: Payer: Self-pay | Admitting: Emergency Medicine

## 2015-05-07 ENCOUNTER — Emergency Department
Admission: EM | Admit: 2015-05-07 | Discharge: 2015-05-08 | Disposition: A | Discharge status: Home / Self Care | Payer: Managed Care, Other (non HMO) | Attending: Emergency Medicine | Admitting: Emergency Medicine

## 2015-05-07 DIAGNOSIS — N200 Calculus of kidney: Secondary | ICD-10-CM | POA: Insufficient documentation

## 2015-05-07 DIAGNOSIS — I10 Essential (primary) hypertension: Secondary | ICD-10-CM | POA: Diagnosis not present

## 2015-05-07 DIAGNOSIS — Z79899 Other long term (current) drug therapy: Secondary | ICD-10-CM | POA: Diagnosis not present

## 2015-05-07 DIAGNOSIS — K8020 Calculus of gallbladder without cholecystitis without obstruction: Secondary | ICD-10-CM | POA: Insufficient documentation

## 2015-05-07 DIAGNOSIS — E119 Type 2 diabetes mellitus without complications: Secondary | ICD-10-CM | POA: Insufficient documentation

## 2015-05-07 DIAGNOSIS — Z7982 Long term (current) use of aspirin: Secondary | ICD-10-CM | POA: Insufficient documentation

## 2015-05-07 DIAGNOSIS — R109 Unspecified abdominal pain: Secondary | ICD-10-CM | POA: Diagnosis present

## 2015-05-07 LAB — COMPREHENSIVE METABOLIC PANEL
ALK PHOS: 72 U/L (ref 38–126)
ALT: 9 U/L — AB (ref 14–54)
AST: 20 U/L (ref 15–41)
Albumin: 3.6 g/dL (ref 3.5–5.0)
Anion gap: 12 (ref 5–15)
BILIRUBIN TOTAL: 1 mg/dL (ref 0.3–1.2)
BUN: 21 mg/dL — AB (ref 6–20)
CALCIUM: 9.7 mg/dL (ref 8.9–10.3)
CHLORIDE: 101 mmol/L (ref 101–111)
CO2: 25 mmol/L (ref 22–32)
CREATININE: 2.06 mg/dL — AB (ref 0.44–1.00)
GFR, EST AFRICAN AMERICAN: 29 mL/min — AB (ref >60)
GFR, EST NON AFRICAN AMERICAN: 25 mL/min — AB (ref >60)
Glucose, Bld: 227 mg/dL — ABNORMAL HIGH (ref 65–99)
Potassium: 4.6 mmol/L (ref 3.5–5.1)
Sodium: 138 mmol/L (ref 135–145)
TOTAL PROTEIN: 8.1 g/dL (ref 6.5–8.1)

## 2015-05-07 LAB — TROPONIN I

## 2015-05-07 LAB — CBC
HCT: 37.8 % (ref 35.0–47.0)
Hemoglobin: 12.3 g/dL (ref 12.0–16.0)
MCH: 28.6 pg (ref 26.0–34.0)
MCHC: 32.6 g/dL (ref 32.0–36.0)
MCV: 87.7 fL (ref 80.0–100.0)
PLATELETS: 220 10*3/uL (ref 150–440)
RBC: 4.31 MIL/uL (ref 3.80–5.20)
RDW: 13.6 % (ref 11.5–14.5)
WBC: 11.6 10*3/uL — AB (ref 3.6–11.0)

## 2015-05-07 LAB — LIPASE, BLOOD: LIPASE: 26 U/L (ref 11–51)

## 2015-05-07 MED ORDER — ONDANSETRON HCL 4 MG/2ML IJ SOLN
4.0000 mg | Freq: Once | INTRAMUSCULAR | Status: AC
  Administered 2015-05-07: 4 mg via INTRAVENOUS
  Filled 2015-05-07: qty 2

## 2015-05-07 MED ORDER — ONDANSETRON HCL 4 MG/2ML IJ SOLN
4.0000 mg | Freq: Once | INTRAMUSCULAR | Status: AC
  Administered 2015-05-07: 4 mg via INTRAVENOUS

## 2015-05-07 MED ORDER — PROMETHAZINE HCL 25 MG/ML IJ SOLN
12.5000 mg | Freq: Once | INTRAMUSCULAR | Status: AC
  Administered 2015-05-07: 12.5 mg via INTRAVENOUS
  Filled 2015-05-07: qty 1

## 2015-05-07 MED ORDER — IOHEXOL 240 MG/ML SOLN
50.0000 mL | Freq: Once | INTRAMUSCULAR | Status: AC | PRN
  Administered 2015-05-07: 50 mL via ORAL

## 2015-05-07 MED ORDER — MORPHINE SULFATE (PF) 2 MG/ML IV SOLN
INTRAVENOUS | Status: AC
  Administered 2015-05-07: 4 mg via INTRAVENOUS
  Filled 2015-05-07: qty 2

## 2015-05-07 MED ORDER — ONDANSETRON HCL 4 MG/2ML IJ SOLN
INTRAMUSCULAR | Status: DC
Start: 2015-05-07 — End: 2015-05-08
  Filled 2015-05-07: qty 2

## 2015-05-07 MED ORDER — MORPHINE SULFATE (PF) 4 MG/ML IV SOLN
4.0000 mg | Freq: Once | INTRAVENOUS | Status: AC
  Administered 2015-05-07: 4 mg via INTRAVENOUS
  Filled 2015-05-07: qty 1

## 2015-05-07 NOTE — ED Provider Notes (Signed)
[facility] Emergency Department Provider Note    ____________________________________________  Time seen: 1925  I have reviewed the triage vital signs and the nursing notes.   HISTORY  Chief Complaint Abdominal Pain   History limited by: Not Limited   HPI [name] is a 59 y.o. female who presents to the emergency department today with concerns forabdominal pain. The patient stated that the pain started roughly 9-1/2 hours ago. The patient states that it was located in her left side and left lower quadrant. It has been constant. It is severe. She describes it as cramping. She has had a bowel movement since the pain started. These have been nonbloody and nondiarrheal. She has had some accompanying nausea and vomiting. Denies any bloody emesis. Denies any fevers. Denies similar symptoms in the past. Denies any recent colonoscopies.   Past Medical History  Diagnosis Date  . Hypertension   . Hypothyroidism   . Diabetes mellitus without complication ([place]) [id number]  . Kidney stone 11/2014    history of    There are no active problems to display for this patient.   History reviewed. No pertinent past surgical history.  Current Outpatient Rx  Name  Route  Sig  Dispense  Refill  . aspirin EC 81 MG tablet   Oral   Take 81 mg by mouth daily.         . cloNIDine (CATAPRES) 0.3 MG tablet      Take 1 tablet by mouth 3  times daily   270 tablet   1   . etodolac (LODINE) 400 MG tablet      Take 1 tablet by mouth two  times daily   180 tablet   0   . furosemide (LASIX) 40 MG tablet      Take 1 tablet by mouth  daily Patient taking differently: Take 1 tablet by mouth  daily in am   90 tablet   0   . levothyroxine (SYNTHROID, LEVOTHROID) 200 MCG tablet      Take 1 tablet by mouth  daily   90 tablet   1   . metFORMIN (GLUCOPHAGE) 500 MG tablet      Take 1 tablet by mouth two  times daily   180 tablet   0   . metoprolol succinate  (TOPROL-XL) 50 MG 24 hr tablet      Take 1 tablet by mouth  daily   90 tablet   1   . telmisartan (MICARDIS) 80 MG tablet      Take 1 tablet by mouth  daily   90 tablet   1   . traMADol (ULTRAM) 50 MG tablet   Oral   Take 50 mg by mouth every 6 (six) hours as needed for moderate pain.           Allergies Review of patient's allergies indicates no known allergies.  Family History  Problem Relation Age of Onset  . Cancer Mother   . Diabetes Father     Social History Social History  Substance Use Topics  . Smoking status: Never Smoker   . Smokeless tobacco: None  . Alcohol Use: No    Review of Systems  Constitutional: Negative for fever. Cardiovascular: Negative for chest pain. Respiratory: Negative for shortness of breath. Gastrointestinal: Positive for left-sided abdominal pain. Genitourinary: Negative for dysuria. Musculoskeletal: Negative for back pain. Skin: Negative for rash. Neurological: Negative for headaches, focal weakness or numbness.  10-point ROS otherwise negative.  ____________________________________________  PHYSICAL EXAM:  VITAL SIGNS: ED Triage Vitals  Enc Vitals Group     BP 05/07/15 1752 234/65 mmHg     Pulse Rate 05/07/15 1747 60     Resp 05/07/15 1747 18     Temp 05/07/15 1747 98.3 F (36.8 C)     Temp Source 05/07/15 1747 Oral     SpO2 05/07/15 1747 100 %     Weight 05/07/15 1747 250 lb (113.399 kg)     Height 05/07/15 1747 5\' 5"  (1.651 m)     Head Cir --      Peak Flow --      Pain Score 05/07/15 1753 10   Constitutional: Alert and oriented. Well appearing and in no distress. Eyes: Conjunctivae are normal. PERRL. Normal extraocular movements. ENT   Head: Normocephalic and atraumatic.   Nose: No congestion/rhinnorhea.   Mouth/Throat: Mucous membranes are moist.   Neck: No stridor. Hematological/Lymphatic/Immunilogical: No cervical lymphadenopathy. Cardiovascular: Normal rate, regular rhythm.  No murmurs,  rubs, or gallops. Respiratory: Normal respiratory effort without tachypnea nor retractions. Breath sounds are clear and equal bilaterally. No wheezes/rales/rhonchi. Gastrointestinal: Soft. Fairly tender to palpation throughout the abdomen however certainly worse in the left lower quadrant. No rebound. No guarding. Genitourinary: Deferred Musculoskeletal: Normal range of motion in all extremities. No joint effusions.  No lower extremity tenderness nor edema. Neurologic:  Normal speech and language. No gross focal neurologic deficits are appreciated.  Skin:  Skin is warm, dry and intact. No rash noted. Psychiatric: Mood and affect are normal. Speech and behavior are normal. Patient exhibits appropriate insight and judgment.  ____________________________________________    LABS (pertinent positives/negatives)  Labs Reviewed  COMPREHENSIVE METABOLIC PANEL - Abnormal; Notable for the following:    Glucose, Bld 227 (*)    BUN 21 (*)    Creatinine, Ser 2.06 (*)    ALT 9 (*)    GFR calc non Af Amer 25 (*)    GFR calc Af Amer 29 (*)    All other components within normal limits  CBC - Abnormal; Notable for the following:    WBC 11.6 (*)    All other components within normal limits  LIPASE, BLOOD  TROPONIN I  URINALYSIS COMPLETEWITH MICROSCOPIC ([facility] ONLY)     ____________________________________________   EKG  I, Nance Pear, attending physician, personally viewed and interpreted this EKG  EKG Time: 1757 Rate: 59 Rhythm: sinus bradycardia Axis: left axis deviation Intervals: qtc 419 QRS: narrow, q waves III, aVF, V1, V2, V3, V4 ST changes: no st elevation Impression: abnormal ekg   ____________________________________________    RADIOLOGY  CT abd/pel pending   ____________________________________________   PROCEDURES  Procedure(s) performed: None  Critical Care performed: No  ____________________________________________   INITIAL IMPRESSION / ASSESSMENT  AND PLAN / ED COURSE  Pertinent labs & imaging results that were available during my care of the patient were reviewed by me and considered in my medical decision making (see chart for details).  Patient presented to the emergency department today because of concerns for left-sided abdominal pain. On exam she does have some tenderness. She had a mild leukocytosis on blood work. I did order a CT abdomen and pelvis to be performed. Differential includes diverticulitis, kidney stones, colitis as my primary concerns.  ____________________________________________   FINAL CLINICAL IMPRESSION(S) / ED DIAGNOSES  Abdominal Pain  Nance Pear, MD 05/07/15 2310

## 2015-05-07 NOTE — ED Notes (Signed)
Pt states that diluted contrast with water from CT is not making her nauseous. Pt is continuing to drink that contrast, is about 3/4 of way done with that. Pt states she is sleepy since receiving Phenergan. Pt speech is clear at a normal pace at this time. Pt is resting comfortably in bed.

## 2015-05-07 NOTE — ED Notes (Signed)
Pt asked for pain medicine. Pt received pain medicine last at 21:25.  When talking to pt, pt states that she is sleepy. Pt's speech has slowed, some words are slurred. When this RN talks to pt for a few minutes, pt's speech clears and she is able to speak in a faster pace. Pt has no facial droop noted.

## 2015-05-07 NOTE — ED Notes (Signed)
CT called. CT suggests we dilute sweetness of oral contrast in either lemonade or water to help pt drink contrast. Pt wants to try water. CT made aware.

## 2015-05-07 NOTE — ED Notes (Signed)
Elenor, [phone], called. Pt says she is a friend and neighbor. Will give pt a phone to call Elenor back.

## 2015-05-07 NOTE — ED Notes (Signed)
Pt to ed with c/o abd pain since 10 am,  Started vomiting at 3pm.  Pt reports pain worse in left lower quad.

## 2015-05-07 NOTE — ED Notes (Signed)
Pt is about 2/3 through first bottle of contrast. Pt states contrast makes her nauseous because contrast is sweet. MD made aware. See [facility] for medicine pt received.

## 2015-05-08 ENCOUNTER — Emergency Department: Payer: Managed Care, Other (non HMO)

## 2015-05-08 LAB — URINALYSIS COMPLETE WITH MICROSCOPIC (ARMC ONLY)
BACTERIA UA: NONE SEEN
Bilirubin Urine: NEGATIVE
GLUCOSE, UA: 150 mg/dL — AB
LEUKOCYTES UA: NEGATIVE
NITRITE: NEGATIVE
PH: 7 (ref 5.0–8.0)
Protein, ur: 30 mg/dL — AB
SPECIFIC GRAVITY, URINE: 1.009 (ref 1.005–1.030)

## 2015-05-08 MED ORDER — ONDANSETRON 4 MG PO TBDP: 4.0000 mg | ORAL_TABLET | Freq: Three times a day (TID) | ORAL | Status: DC | PRN

## 2015-05-08 MED ORDER — OXYCODONE-ACETAMINOPHEN 5-325 MG PO TABS: 1.0000 | ORAL_TABLET | ORAL | Status: DC | PRN

## 2015-05-08 MED ORDER — TAMSULOSIN HCL 0.4 MG PO CAPS
0.4000 mg | ORAL_CAPSULE | Freq: Every day | ORAL | Status: DC
Start: 2015-05-08 — End: 2015-12-09

## 2015-05-08 NOTE — ED Notes (Signed)
Pt back from CT

## 2015-05-08 NOTE — ED Provider Notes (Signed)
I seem care of the patient 11:00 PM from Dr. Archie Balboa. CT scan of the abdomen revealed CT Abdomen Pelvis Wo Contrast (Final result) Result time: 05/08/15 00:53:37   Final result by Rad Results In Interface (05/08/15 00:53:37)   Narrative:   CLINICAL DATA: Left lower quadrant abdominal pain, onset today. Nausea and vomiting.  EXAM: CT ABDOMEN AND PELVIS WITHOUT CONTRAST  TECHNIQUE: Multidetector CT imaging of the abdomen and pelvis was performed following the standard protocol without IV contrast.  COMPARISON: CT 12/15/2014  FINDINGS: Lower chest: The included lung bases are clear.  Liver: No focal lesion.  Hepatobiliary: Multiple calcified gallstones within the gallbladder. No biliary dilatation or pericholecystic inflammatory change.  Pancreas: Normal.  Spleen: Normal. Splenule at the hilum.  Adrenal glands: No nodule.  Kidneys: Left hydroureteronephrosis secondary to an obstructing 6 x 5 mm stone in the mid ureter. Significant perinephric edema. At least 3 additional nonobstructing stones in the left kidney. Questionable punctate stone in the lower right kidney, no right obstructive uropathy. Simple cyst in the interpolar right kidney measures 17 mm.  Stomach/Bowel: Stomach physiologically distended. There are no dilated or thickened small bowel loops. Small volume of stool throughout the colon without colonic wall thickening. The appendix is normal.  Vascular/Lymphatic: No retroperitoneal adenopathy. Small bilateral external iliac lymph nodes, unchanged from prior. Abdominal aorta is normal in caliber. Mild atherosclerotic calcification and tortuosity, no aneurysm.  Reproductive: Atrophic uterus, normal for age. Quiescent ovaries.  Bladder: Physiologically distended, no wall thickening.  Other: No free air, free fluid, or intra-abdominal fluid collection. Tiny fat containing umbilical hernia.  Musculoskeletal: There are no acute or suspicious  osseous abnormalities. Multilevel degenerative change throughout spine. Vacuum phenomenon at L4-L5 with subchondral change, stable in appearance from prior exam. Advanced osteoarthritis of the right hip.  IMPRESSION: 1. Obstructing 6 x 7 mm stone in the left mid ureter with proximal hydroureteronephrosis and significant perinephric stranding. Additional nonobstructing stones in the left kidney. 2. Chronic incidental findings include cholelithiasis, atherosclerosis, and advanced lumbar spine and right hip degenerative change.   Electronically Signed By: Jeb Levering M.D. On: 05/08/2015 00:53   I evaluated the patient who was pain-free at the time of evaluation. I informed her of the CT scan findings. I advised patient to return to the emergency department immediately if pain worsening pain vomiting or any emergency medical concerns. Patient is being referred to Dr. Junious Silk urologist on call for outpatient management  Gregor Hams, MD 05/08/15 [phone]

## 2015-05-08 NOTE — Discharge Instructions (Signed)
Cholelithiasis °Cholelithiasis (also called gallstones) is a form of gallbladder disease in which gallstones form in your gallbladder. The gallbladder is an organ that stores bile made in the liver, which helps digest fats. Gallstones begin as small crystals and slowly grow into stones. Gallstone pain occurs when the gallbladder spasms and a gallstone is blocking the duct. Pain can also occur when a stone passes out of the duct.  °RISK FACTORS °· Being female.   °· Having multiple pregnancies. Health care providers sometimes advise removing diseased gallbladders before future pregnancies.   °· Being obese. °· Eating a diet heavy in fried foods and fat.   °· Being older than 60 years and increasing age.   °· Prolonged use of medicines containing female hormones.   °· Having diabetes mellitus.   °· Rapidly losing weight.   °· Having a family history of gallstones (heredity).   °SYMPTOMS °· Nausea.   °· Vomiting. °· Abdominal pain.   °· Yellowing of the skin (jaundice).   °· Sudden pain. It may persist from several minutes to several hours. °· Fever.   °· Tenderness to the touch.  °In some cases, when gallstones do not move into the bile duct, people have no pain or symptoms. These are called "silent" gallstones.  °TREATMENT °Silent gallstones do not need treatment. In severe cases, emergency surgery may be required. Options for treatment include: °· Surgery to remove the gallbladder. This is the most common treatment. °· Medicines. These do not always work and may take 6-12 months or more to work. °· Shock wave treatment (extracorporeal biliary lithotripsy). In this treatment an ultrasound machine sends shock waves to the gallbladder to break gallstones into smaller pieces that can pass into the intestines or be dissolved by medicine. °HOME CARE INSTRUCTIONS  °· Only take over-the-counter or prescription medicines for pain, discomfort, or fever as directed by your health care provider.   °· Follow a low-fat diet until  seen again by your health care provider. Fat causes the gallbladder to contract, which can result in pain.   °· Follow up with your health care provider as directed. Attacks are almost always recurrent and surgery is usually required for permanent treatment.   °SEEK IMMEDIATE MEDICAL CARE IF:  °· Your pain increases and is not controlled by medicines.   °· You have a fever or persistent symptoms for more than 2-3 days.   °· You have a fever and your symptoms suddenly get worse.   °· You have persistent nausea and vomiting.   °MAKE SURE YOU:  °· Understand these instructions. °· Will watch your condition. °· Will get help right away if you are not doing well or get worse. °  °This information is not intended to replace advice given to you by your health care provider. Make sure you discuss any questions you have with your health care provider. °  °Document Released: 06/09/2005 Document Revised: 02/13/2013 Document Reviewed: 12/05/2012 °Elsevier Interactive Patient Education ©2016 [employer] ° °Kidney Stones °Kidney stones (urolithiasis) are deposits that form inside your kidneys. The intense pain is caused by the stone moving through the urinary tract. When the stone moves, the ureter goes into spasm around the stone. The stone is usually passed in the urine.  °CAUSES  °· A disorder that makes certain neck glands produce too much parathyroid hormone (primary hyperparathyroidism). °· A buildup of uric acid crystals, similar to gout in your joints. °· Narrowing (stricture) of the ureter. °· A kidney obstruction present at birth (congenital obstruction). °· Previous surgery on the kidney or ureters. °· Numerous kidney infections. °SYMPTOMS  °· Feeling sick   to your stomach (nauseous). °· Throwing up (vomiting). °· Blood in the urine (hematuria). °· Pain that usually spreads (radiates) to the groin. °· Frequency or urgency of urination. °DIAGNOSIS  °· Taking a history and physical exam. °· Blood or urine tests. °· CT  scan. °· Occasionally, an examination of the inside of the urinary bladder (cystoscopy) is performed. °TREATMENT  °· Observation. °· Increasing your fluid intake. °· Extracorporeal shock wave lithotripsy--This is a noninvasive procedure that uses shock waves to break up kidney stones. °· Surgery may be needed if you have severe pain or persistent obstruction. There are various surgical procedures. Most of the procedures are performed with the use of small instruments. Only small incisions are needed to accommodate these instruments, so recovery time is minimized. °The size, location, and chemical composition are all important variables that will determine the proper choice of action for you. Talk to your health care provider to better understand your situation so that you will minimize the risk of injury to yourself and your kidney.  °HOME CARE INSTRUCTIONS  °· Drink enough water and fluids to keep your urine clear or pale yellow. This will help you to pass the stone or stone fragments. °· Strain all urine through the provided strainer. Keep all particulate matter and stones for your health care provider to see. The stone causing the pain may be as small as a grain of salt. It is very important to use the strainer each and every time you pass your urine. The collection of your stone will allow your health care provider to analyze it and verify that a stone has actually passed. The stone analysis will often identify what you can do to reduce the incidence of recurrences. °· Only take over-the-counter or prescription medicines for pain, discomfort, or fever as directed by your health care provider. °· Keep all follow-up visits as told by your health care provider. This is important. °· Get follow-up X-rays if required. The absence of pain does not always mean that the stone has passed. It may have only stopped moving. If the urine remains completely obstructed, it can cause loss of kidney function or even complete  destruction of the kidney. It is your responsibility to make sure X-rays and follow-ups are completed. Ultrasounds of the kidney can show blockages and the status of the kidney. Ultrasounds are not associated with any radiation and can be performed easily in a matter of minutes. °· Make changes to your daily diet as told by your health care provider. You may be told to: °¨ Limit the amount of salt that you eat. °¨ Eat 5 or more servings of fruits and vegetables each day. °¨ Limit the amount of meat, poultry, fish, and eggs that you eat. °· Collect a 24-hour urine sample as told by your health care provider. You may need to collect another urine sample every 6-12 months. °SEEK MEDICAL CARE IF: °· You experience pain that is progressive and unresponsive to any pain medicine you have been prescribed. °SEEK IMMEDIATE MEDICAL CARE IF:  °· Pain cannot be controlled with the prescribed medicine. °· You have a fever or shaking chills. °· The severity or intensity of pain increases over 18 hours and is not relieved by pain medicine. °· You develop a new onset of abdominal pain. °· You feel faint or pass out. °· You are unable to urinate. °  °This information is not intended to replace advice given to you by your health care provider. Make sure you discuss   any questions you have with your health care provider. °  °Document Released: 06/13/2005 Document Revised: 03/04/2015 Document Reviewed: 11/14/2012 °Elsevier Interactive Patient Education ©2016 [employer] ° °

## 2015-05-08 NOTE — ED Notes (Signed)
Pt awake and calling ride.

## 2015-05-08 NOTE — ED Notes (Signed)
Pt almost done drinking oral contrast. Pt verbalized need to use restroom, but stated she did not want to get up until she finished contrast. Pt was instructed to use call bell when done with contrast and this RN will help assist to toilet. Pt states she has been dozing off.

## 2015-05-08 NOTE — ED Notes (Signed)
Patient transported to CT via stretcher.

## 2015-05-08 NOTE — ED Notes (Signed)
Pt has called a friend who will be here to pick up pt at 0700. Pt is getting dressed.

## 2015-05-08 NOTE — ED Notes (Signed)
Pt asleep in stretcher

## 2015-05-08 NOTE — ED Notes (Signed)
Dr. Brown at bedside

## 2015-05-12 ENCOUNTER — Encounter: Payer: Managed Care, Other (non HMO) | Admitting: Surgery

## 2015-05-12 DIAGNOSIS — E11622 Type 2 diabetes mellitus with other skin ulcer: Secondary | ICD-10-CM | POA: Diagnosis not present

## 2015-05-13 NOTE — Progress Notes (Addendum)
IRJA, MARRAZZO ([id number]) Visit Report for 05/12/2015 Chief Complaint Document Details Patient Name: [name], [name]. Date of Service: 05/12/2015 2:30 PM Medical Record Number: [id number] Patient Account Number: [id number] Date of Birth/Sex: 1955-12-11 (59 y.o. 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 Electronic Signature(s) Signed: 05/12/2015 3:31:38 PM By: Christin Fudge MD, FACS Entered By: Christin Fudge on 05/12/2015 15:31:36 [name] ([id number]) -------------------------------------------------------------------------------- HPI Details Patient Name: [name] Date of Service: 05/12/2015 2:30 PM Medical Record Number: [id number] Patient Account Number: [id number] Date of Birth/Sex: 1955/10/03 (59 y.o. Female) Treating RN: Cornell Barman Primary Care Physician: [name] Other Clinician: Referring Physician: [name] Treating Physician/Extender: Frann Rider in Treatment: 110 History of Present Illness HPI Description: 59yo w/ h/o BLE phlebolymphedema, type 2 DM, and obesity. No PVD. h/o chronic, recurrent BLE calf ulcers. Treated with 4 layer compression. Infrequently uses lymphedema pump. Bilateral lower extremity ulcerations healed as of June 2016. Fitted for custom compression stockings but not yet received. Awaiting lymphedema consult. She was transitioned to juxtalite compression garment on the left. Compression bandage applied to right leg on 02/25/2015. Patient has not followed up since that time, wearing same compression bandage. She returns to clinic today and has developed recurrent bilateral calf ulcerations. No significant pain. No fever or  chills. Moderate drainage. 04/07/2015 -- her hip surgery was canceled due to medical reasons and she is here without any compression today. I understand from Dr. Quay Burow note that she has been very noncompliant in the last time did not come back for over a month and had the same compression bandage. Today she does not have any compression and she says they were wet and hence she didn't wear them. 04/14/2015 -- she says she has got some transport organizer and will be here every week for regular appointments. She has been using her juxta lights at home. 05/12/2015 -- her right leg is now completely healed but she has developed 2 new blisters on her left leg which have opened out into ulcers. Electronic Signature(s) Signed: 05/12/2015 3:32:17 PM By: Christin Fudge MD, FACS Entered By: Christin Fudge on 05/12/2015 15:32:16 [name] ([id number]) -------------------------------------------------------------------------------- Physical Exam Details Patient Name: [name] Date of Service: 05/12/2015 2:30 PM Medical Record Number: [id number] Patient Account Number: [id number] Date of Birth/Sex: [birth year] (59 y.o. Female) Treating RN: Cornell Barman Primary Care Physician: [name] Other Clinician: Referring Physician: [name] Treating Physician/Extender: Frann Rider in Treatment: 110 Constitutional . Pulse regular. Respirations normal and unlabored. Afebrile. . Eyes Nonicteric. Reactive to light. Ears, Nose, Mouth, and Throat Lips, teeth, and gums WNL.Marland Kitchen Moist mucosa without lesions . Neck supple and nontender. No palpable supraclavicular or cervical adenopathy. Normal sized without goiter. Respiratory WNL. No retractions.. Cardiovascular Pedal Pulses WNL. significant lymphedema of the left lower extremity. Lymphatic No adneopathy. No adenopathy. No adenopathy. Musculoskeletal Adexa without tenderness or enlargement.. Digits and nails w/o clubbing,  cyanosis, infection, petechiae, ischemia, or inflammatory conditions.. Integumentary (Hair, Skin) No suspicious lesions. No crepitus or fluctuance. No peri-wound warmth or erythema. No masses.Marland Kitchen Psychiatric Judgement and insight Intact.. No evidence of depression, anxiety, or agitation.. Notes the right lower eczema and is completely healed. The left lower extremity which was looking excellent last week now has 2 open ulcerations which are superficial Electronic Signature(s) Signed: 05/12/2015 3:33:27 PM  By: Christin Fudge MD, FACS Entered By: Christin Fudge on 05/12/2015 15:33:27 [name] ([id number]) -------------------------------------------------------------------------------- Physician Orders Details Patient Name: [name] Date of Service: 05/12/2015 2:30 PM Medical Record Number: [id number] Patient Account Number: [id number] Date of Birth/Sex: 1956-04-14 (59 y.o. Female) Treating RN: Cornell Barman Primary Care Physician: [name] Other Clinician: Referring Physician: [name] Treating Physician/Extender: Frann Rider in Treatment: 110 Verbal / Phone Orders: Yes Clinician: Cornell Barman Read Back and Verified: Yes Diagnosis Coding ICD-9 Coding Code Description 457.1 Other Lymphedema - excludes congenital, eyelid or vulva 459.33 Chronic venous hypertension with ulcer and inflammation 707.12 Ulcer of lower limbs, except pressure ulcer; Ulcer of calf ICD-10 Coding Code Description I89.0 Lymphedema, not elsewhere classified E66.9 Obesity, unspecified E11.40 Type 2 diabetes mellitus with diabetic neuropathy, unspecified R26.89 Other abnormalities of gait and mobility 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 ([id number]) Wound #57 Left Lower Leg o Boardered Foam Dressing Dressing Change Frequency Wound #56 Left,Medial Lower Leg o Change dressing every other day. Wound #57 Left Lower Leg o Change dressing every other day. Follow-up Appointments Wound #56 Left,Medial Lower Leg o Return Appointment in 1 week. Wound #57 Left Lower Leg o Return Appointment in 1 week. Edema Control Wound #56 Left,Medial Lower Leg o Compression Pump: Use compression pump on left lower extremity for 30 minutes, twice daily. o Compression Pump: Use compression pump on right lower extremity for 30 minutes, twice daily. o Other: - Juxtafit bilateral Wound #57 Left Lower Leg o Compression Pump: Use compression pump on left lower extremity for 30 minutes, twice daily. o Compression Pump: Use compression pump on right lower extremity for 30 minutes, twice daily. o Other: - Juxtafit bilateral Electronic Signature(s) Signed: 05/12/2015 4:44:09 PM By: Christin Fudge MD, FACS Signed: 05/12/2015 5:36:16 PM By: Gretta Cool RN, BSN, Kim RN, BSN Entered By: Gretta Cool, RN, BSN, Kim on 05/12/2015 16:36:32 [name] ([id number]) -------------------------------------------------------------------------------- Problem List Details Patient Name: [name]. Date of Service: 05/12/2015 2:30 PM Medical Record Number: [id number] Patient Account Number: [id number] Date of Birth/Sex: 1955/08/07 (59 y.o. Female) Treating RN: Cornell Barman Primary Care Physician: [name] Other Clinician: Referring  Physician: [name] Treating Physician/Extender: Frann Rider in Treatment: 110 Active Problems ICD-9 Encounter Code Description Active Date Diagnosis 457.1 Other Lymphedema - excludes congenital, eyelid or vulva 03/27/2013 Yes 459.33 Chronic venous hypertension with ulcer and inflammation 03/27/2013 Yes 707.12 Ulcer of lower limbs, except pressure ulcer; Ulcer of calf 03/27/2013 Yes ICD-10 Encounter Code Description Active Date Diagnosis I89.0 Lymphedema, not elsewhere classified 03/26/2014 Yes E66.9 Obesity, unspecified 03/26/2014 Yes E11.40 Type 2 diabetes mellitus with diabetic neuropathy, 01/14/2015 Yes unspecified R26.89 Other abnormalities of gait and mobility 01/14/2015 Yes 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 02/25/2015 Yes calf and inflammation I83.222 Varicose veins of left lower extremity with both ulcer of 11/12/2014 Yes calf and inflammation [name],  [name] ([id number] Type 2 diabetes mellitus with other skin ulcer 02/25/2015 Yes L97.221 Non-pressure chronic ulcer of left calf limited to 05/12/2015 Yes breakdown of skin Inactive Problems Resolved Problems ICD-10 Code Description Active Date Resolved Date E11.621 Type 2 diabetes mellitus with foot ulcer 10/08/2014 10/09/2014 L03.115 Cellulitis of right lower limb 02/25/2015 02/25/2015 L84 Corns and callosities 11/12/2014 11/12/2014 Electronic Signature(s) Signed: 05/12/2015 3:30:02 PM By: Christin Fudge MD, FACS Entered By: Christin Fudge on 05/12/2015 15:30:01 [name] ([id number]) -------------------------------------------------------------------------------- Progress Note Details Patient Name: [name]. Date of Service: 05/12/2015 2:30 PM Medical Record Number: [id number] Patient Account Number: [id number] Date of Birth/Sex: Nov 11, 1955 (59 y.o. 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 History of Present Illness (HPI) 59yo w/ h/o BLE phlebolymphedema, type 2 DM, and obesity. No PVD. h/o chronic, recurrent BLE calf ulcers. Treated with 4 layer compression. Infrequently uses lymphedema pump. Bilateral lower extremity ulcerations healed as of June 2016. Fitted for custom compression stockings but not yet received. Awaiting lymphedema consult. She was transitioned to juxtalite compression garment on the left. Compression bandage applied to right leg on 02/25/2015. Patient has not followed up since that time, wearing same compression bandage. She returns to clinic today and has developed recurrent bilateral calf ulcerations. No significant pain. No fever or chills. Moderate drainage. 04/07/2015 -- her hip surgery was canceled due to medical reasons and she is here without any compression today. I understand from Dr. Quay Burow note that she has been very noncompliant in the last time did not come back for over a month and had the same compression bandage. Today she does not have any compression and she says they were wet and hence she didn't wear them. 04/14/2015 -- she says she has got some transport organizer and will be here every week for regular appointments. She has been using her juxta lights at home. 05/12/2015 -- her right leg is now completely healed but she has developed 2 new blisters on her left leg which have opened out into ulcers. Objective Constitutional Pulse regular. Respirations normal and unlabored. Afebrile. Vitals Time Taken: 2:52 PM, Height: 65 in, Temperature: 98.1 F, Pulse: 62 bpm, Respiratory Rate: 18 breaths/min, Blood Pressure: 193/57 mmHg. [name], [name] ([id number]) Eyes Nonicteric. Reactive to light. Ears, Nose,  Mouth, and Throat Lips, teeth, and gums WNL.Marland Kitchen Moist mucosa without lesions . Neck supple and nontender. No palpable supraclavicular or cervical adenopathy. Normal sized without goiter. Respiratory WNL. No retractions.. Cardiovascular Pedal Pulses WNL. significant lymphedema of the left lower extremity. Lymphatic No adneopathy. No adenopathy. No adenopathy. Musculoskeletal Adexa without tenderness or enlargement.. Digits and nails w/o clubbing, cyanosis, infection, petechiae, ischemia, or inflammatory conditions.Marland Kitchen Psychiatric Judgement and insight Intact.. No evidence of depression, anxiety, or agitation.. General Notes: the right lower eczema and is completely healed. The left lower extremity which was looking excellent last week now has 2 open ulcerations which are superficial Integumentary (Hair, Skin) No suspicious lesions. No crepitus or fluctuance. No peri-wound warmth or erythema. No masses.. Wound #55 status is Healed - Epithelialized. Original cause of wound was Blister. The wound is located on the Right,Proximal,Lateral Lower Leg. The wound measures 0cm length x 0cm width x 0cm depth; 0cm^2 area and 0cm^3 volume. The wound is limited to skin breakdown. There is a large amount of serous  drainage noted. The wound margin is flat and intact. There is large (67-100%) pink granulation within the wound bed. There is no necrotic tissue within the wound bed. The periwound skin appearance did not exhibit: Callus, Crepitus, Excoriation, Fluctuance, Friable, Induration, Localized Edema, Rash, Scarring, Dry/Scaly, Maceration, Moist, Atrophie Blanche, Cyanosis, Ecchymosis, Hemosiderin Staining, Mottled, Pallor, Rubor, Erythema. The periwound has tenderness on palpation. Wound #56 status is Open. Original cause of wound was Gradually Appeared. The wound is located on the Left,Medial Lower Leg. The wound measures 2cm length x 8.2cm width x 0.1cm depth; 12.881cm^2 area and 1.288cm^3 volume. The  wound is limited to skin breakdown. There is a medium amount of serous drainage noted. The wound margin is flat and intact. There is large (67-100%) red granulation within the wound bed. There is a small (1-33%) amount of necrotic tissue within the wound bed. The periwound skin appearance exhibited: Maceration, Moist. The periwound skin appearance did not exhibit: Callus, Crepitus, Excoriation, Fluctuance, Friable, Induration, Localized Edema, Rash, Scarring, Dry/Scaly, Atrophie Blanche, Cyanosis, Ecchymosis, Hemosiderin Staining, Mottled, Pallor, Rubor, Erythema. [name], [name] ([id number]) Wound #57 status is Open. Original cause of wound was Gradually Appeared. The wound is located on the Left Lower Leg. The wound measures 1.2cm length x 0.8cm width x 0.1cm depth; 0.754cm^2 area and 0.075cm^3 volume. The wound is limited to skin breakdown. There is a medium amount of serous drainage noted. The wound margin is flat and intact. There is large (67-100%) red granulation within the wound bed. The periwound skin appearance did not exhibit: Callus, Crepitus, Excoriation, Fluctuance, Friable, Induration, Localized Edema, Rash, Scarring, Dry/Scaly, Maceration, Moist, Atrophie Blanche, Cyanosis, Ecchymosis, Hemosiderin Staining, Mottled, Pallor, Rubor, Erythema. 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 I83.222 - Varicose veins of left lower extremity with both ulcer of calf and inflammation E11.622 - Type 2 diabetes  mellitus with other skin ulcer L97.221 - Non-pressure chronic ulcer of left calf limited to breakdown of skin On the right leg she will use her juxta light stockings and appropriate elevation has been emphasized. On her left leg for the new ulceration we will use Aquacel Ag with her juxta light compression and I have gone over details about when to put the compression stockings on when to take them out in the night and we have discussed elevation. We have also discussed using her lymphedema pumps twice a day. I have asked her to be compliant as we are continually having new problems with her and most of it is just due to her noncompliance. She will come back to see me next week. Plan Wound Cleansing: Wound #56 Left,Medial Lower Leg: Cleanse wound with mild soap and water [name], [name] ([id number]) Wound #57 Left Lower Leg: Cleanse wound with mild soap and water Primary Wound Dressing: Wound #56 Left,Medial Lower Leg: Aquacel Ag Wound #57 Left Lower Leg: Aquacel Ag Secondary Dressing: Wound #56 Left,Medial Lower Leg: Boardered Foam Dressing Wound #57 Left Lower Leg: Boardered Foam Dressing Dressing Change Frequency: Wound #56 Left,Medial Lower Leg: Change dressing every other day. Wound #57 Left Lower Leg: Change dressing every  other day. Follow-up Appointments: Wound #56 Left,Medial Lower Leg: Return Appointment in 1 week. Wound #57 Left Lower Leg: Return Appointment in 1 week. Edema Control: Wound #56 Left,Medial Lower Leg: Compression Pump: Use compression pump on left lower extremity for 30 minutes, twice daily. Compression Pump: Use compression pump on right lower extremity for 30 minutes, twice daily. Other: - Juxtafit bilateral Wound #57 Left Lower Leg: Compression Pump: Use compression pump on left lower extremity for 30 minutes, twice daily. Compression Pump: Use compression pump on right lower extremity for 30 minutes, twice daily. Other: - Juxtafit  bilateral On the right leg she will use her juxta light stockings and appropriate elevation has been emphasized. On her left leg for the new ulceration we will use Aquacel Ag with her juxta light compression and I have gone over details about when to put the compression stockings on when to take them out in the night and we have discussed elevation. We have also discussed using her lymphedema pumps twice a day. I have asked her to be compliant as we are continually having new problems with her and most of it is just due to her noncompliance. She will come back to see me next week. Electronic Signature(s) [name], [name] ([id number]) Signed: 05/13/2015 4:10:46 PM By: Christin Fudge MD, FACS Previous Signature: 05/12/2015 3:35:24 PM Version By: Christin Fudge MD, FACS Entered By: Christin Fudge on 05/13/2015 16:10:17 [name] ([id number]) -------------------------------------------------------------------------------- SuperBill Details Patient Name: [name] Date of Service: 05/12/2015 Medical Record Number: [id number] Patient Account Number: [id number] Date of Birth/Sex: 1956-04-03 (59 y.o. Female) Treating RN: Cornell Barman Primary Care Physician: [name] Other Clinician: Referring Physician: [name] Treating Physician/Extender: Frann Rider in Treatment: 110 Diagnosis Coding ICD-9 Codes Code Description 457.1 Other Lymphedema - excludes congenital, eyelid or vulva 459.33 Chronic venous hypertension with ulcer and inflammation 707.12 Ulcer of lower limbs, except pressure ulcer; Ulcer of calf ICD-10 Codes Code Description I89.0 Lymphedema, not elsewhere classified E66.9 Obesity, unspecified E11.40 Type 2 diabetes mellitus with diabetic neuropathy, unspecified R26.89 Other abnormalities of gait and mobility 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 I83.222 Varicose veins of left lower extremity with  both ulcer of calf and inflammation E11.622 Type 2 diabetes mellitus with other skin ulcer L97.221 Non-pressure chronic ulcer of left calf limited to breakdown of skin Facility Procedures CPT4 Code: [id number] Description: [facility] VISIT-LEV 3 EST PT Modifier: Quantity: 1 Physician Procedures CPT4 Code: [id number] Description: [id number] - WC PHYS LEVEL 3 - EST PT ICD-10 Description Diagnosis I89.0 Lymphedema, not elsewhere classified E66.9 Obesity, unspecified E11.40 Type 2 diabetes mellitus with diabetic neuropathy, R26.89 Other abnormalities of gait and mobility Modifier: unspecified Quantity: 1 Electronic Signature(s) Signed: 05/12/2015 4:44:09 PM By: Christin Fudge MD, FACS [name] ([id number]) Signed: 05/12/2015 5:36:16 PM By: Gretta Cool RN, BSN, Kim RN, BSN Previous Signature: 05/12/2015 3:35:45 PM Version By: Christin Fudge MD, FACS Entered By: Gretta Cool RN, BSN, Kim on 05/12/2015 16:38:30

## 2015-05-13 NOTE — Progress Notes (Signed)
EMMAGENE, SESTER ([id number]) Visit Report for 05/12/2015 Arrival Information Details Patient Name: [name], [name]. Date of Service: 05/12/2015 2:30 PM Medical Record Number: [id number] Patient Account Number: [id number] Date of Birth/Sex: July 13, 1955 (59 y.o. Female) Treating RN: Cornell Barman Primary Care Physician: [name] Other Clinician: Referring Physician: [name] Treating Physician/Extender: Frann Rider in Treatment: 61 Visit Information History Since Last Visit Added or deleted any medications: No Patient Arrived: Gilford Rile Any new allergies or adverse reactions: No Arrival Time: 14:50 Had a fall or experienced change in No Accompanied By: self activities of daily living that may affect Transfer Assistance: None risk of falls: Patient Identification Verified: Yes Signs or symptoms of abuse/neglect since last No Secondary Verification Process Yes visito Completed: Hospitalized since last visit: No Patient Requires Transmission- No Has Dressing in Place as Prescribed: Yes Based Precautions: Has Compression in Place as Prescribed: Yes Patient Has Alerts: Yes Pain Present Now: No Patient Alerts: Patient on Blood Thinner Electronic Signature(s) Signed: 05/12/2015 5:36:16 PM By: Gretta Cool, RN, BSN, Kim RN, BSN Entered By: Gretta Cool, RN, BSN, Kim on 05/12/2015 14:51:53 [name] ([id number]) -------------------------------------------------------------------------------- Clinic Level of Care Assessment Details Patient Name: [name]. Date of Service: 05/12/2015 2:30 PM Medical Record Number: [id number] Patient Account Number: [id number] Date of Birth/Sex: 11-Apr-1956 (59 y.o. Female) Treating RN: Cornell Barman Primary Care Physician: [name] Other Clinician: Referring Physician: [name] Treating Physician/Extender: Frann Rider in Treatment: 110 Clinic Level of Care Assessment Items TOOL 4 Quantity Score []  - Use when  only an EandM is performed on FOLLOW-UP visit 0 ASSESSMENTS - Nursing Assessment / Reassessment []  - Reassessment of Co-morbidities (includes updates in patient status) 0 X - Reassessment of Adherence to Treatment Plan 1 5 ASSESSMENTS - Wound and Skin Assessment / Reassessment []  - Simple Wound Assessment / Reassessment - one wound 0 X - Complex Wound Assessment / Reassessment - multiple wounds 2 5 []  - Dermatologic / Skin Assessment (not related to wound area) 0 ASSESSMENTS - Focused Assessment []  - Circumferential Edema Measurements - multi extremities 0 []  - Nutritional Assessment / Counseling / Intervention 0 []  - Lower Extremity Assessment (monofilament, tuning fork, pulses) 0 []  - Peripheral Arterial Disease Assessment (using hand held doppler) 0 ASSESSMENTS - Ostomy and/or Continence Assessment and Care []  - Incontinence Assessment and Management 0 []  - Ostomy Care Assessment and Management (repouching, etc.) 0 PROCESS - Coordination of Care []  - Simple Patient / Family Education for ongoing care 0 X - Complex (extensive) Patient / Family Education for ongoing care 1 20 []  - Staff obtains Programmer, systems, Records, Test Results / Process Orders 0 []  - Staff telephones HHA, Nursing Homes / Clarify orders / etc 0 []  - Routine Transfer to another Facility (non-emergent condition) 0 [name], [name] ([id number]) []  - Routine Hospital Admission (non-emergent condition) 0 []  - New Admissions / Biomedical engineer / Ordering NPWT, Apligraf, etc. 0 []  - Emergency Hospital Admission (emergent condition) 0 X - Simple Discharge Coordination 1 10 []  - Complex (extensive) Discharge Coordination 0 PROCESS - Special Needs []  - Pediatric / Minor Patient Management 0 []  - Isolation Patient Management 0 []  - Hearing / Language / Visual special needs 0 []  - Assessment of Community assistance (transportation, D/C planning, etc.) 0 []  - Additional assistance / Altered mentation 0 []  - Support  Surface(s) Assessment (bed, cushion, seat, etc.) 0 INTERVENTIONS - Wound Cleansing / Measurement []  - Simple Wound Cleansing - one wound 0 X - Complex Wound Cleansing -  multiple wounds 2 5 X - Wound Imaging (photographs - any number of wounds) 1 5 []  - Wound Tracing (instead of photographs) 0 []  - Simple Wound Measurement - one wound 0 X - Complex Wound Measurement - multiple wounds 2 5 INTERVENTIONS - Wound Dressings []  - Small Wound Dressing one or multiple wounds 0 X - Medium Wound Dressing one or multiple wounds 2 15 []  - Large Wound Dressing one or multiple wounds 0 []  - Application of Medications - topical 0 []  - Application of Medications - injection 0 INTERVENTIONS - Miscellaneous []  - External ear exam 0 [name], [name] ([id number]) []  - Specimen Collection (cultures, biopsies, blood, body fluids, etc.) 0 []  - Specimen(s) / Culture(s) sent or taken to Lab for analysis 0 []  - Patient Transfer (multiple staff / Harrel Lemon Lift / Similar devices) 0 []  - Simple Staple / Suture removal (25 or less) 0 []  - Complex Staple / Suture removal (26 or more) 0 []  - Hypo / Hyperglycemic Management (close monitor of Blood Glucose) 0 []  - Ankle / Brachial Index (ABI) - do not check if billed separately 0 X - Vital Signs 1 5 Has the patient been seen at the hospital within the last three years: Yes Total Score: 105 Level Of Care: New/Established - Level 3 Electronic Signature(s) Signed: 05/12/2015 5:36:16 PM By: Gretta Cool, RN, BSN, Kim RN, BSN Entered By: Gretta Cool, RN, BSN, Kim on 05/12/2015 16:38:14 [name] ([id number]) -------------------------------------------------------------------------------- Encounter Discharge Information Details Patient Name: [name]. Date of Service: 05/12/2015 2:30 PM Medical Record Number: [id number] Patient Account Number: [id number] Date of Birth/Sex: 11-28-1955 (59 y.o. Female) Treating RN: Cornell Barman Primary Care Physician: [name] Other Clinician: Referring Physician: [name] Treating Physician/Extender: Frann Rider in Treatment: 110 Encounter Discharge Information Items Discharge Pain Level: 0 Discharge Condition: Stable Ambulatory Status: Walker Discharge Destination: Home Transportation: Private Auto Accompanied By: self Schedule Follow-up Appointment: Yes Medication Reconciliation completed and provided to Patient/Care Yes [name]: Provided on Clinical Summary of Care: 05/12/2015 Form Type Recipient Paper Patient GI Electronic Signature(s) Signed: 05/12/2015 5:36:16 PM By: Gretta Cool RN, BSN, Kim RN, BSN Previous Signature: 05/12/2015 3:35:07 PM Version By: Ruthine Dose Entered By: Gretta Cool RN, BSN, Kim on 05/12/2015 16:39:39 [name] ([id number]) -------------------------------------------------------------------------------- Lower Extremity Assessment Details Patient Name: [name]. Date of Service: 05/12/2015 2:30 PM Medical Record Number: [id number] Patient Account Number: [id number] Date of Birth/Sex: 1955-12-06 (59 y.o. Female) Treating RN: Cornell Barman Primary Care Physician: [name] Other Clinician: Referring Physician: [name] Treating Physician/Extender: Frann Rider in Treatment: 110 Vascular Assessment Pulses: Posterior Tibial Dorsalis Pedis Palpable: [Left:Yes] [Right:Yes] Extremity colors, hair growth, and conditions: Extremity Color: [Left:Hyperpigmented] [Right:Hyperpigmented] Hair Growth on Extremity: [Left:Yes] [Right:Yes] Temperature of Extremity: [Left:Warm] [Right:Warm] Capillary Refill: [Left:< 3 seconds] [Right:< 3 seconds] Toe Nail Assessment Left: Right: Thick: Yes Yes Discolored: Yes Yes Deformed: Yes Yes Improper Length and Hygiene: Yes Yes Electronic Signature(s) Signed: 05/12/2015 5:36:16 PM By: Gretta Cool, RN, BSN, Kim RN, BSN Entered By: Gretta Cool, RN, BSN, Kim on 05/12/2015 15:23:12 [name]  ([id number]) -------------------------------------------------------------------------------- Multi Wound Chart Details Patient Name: [name]. Date of Service: 05/12/2015 2:30 PM Medical Record Number: [id number] Patient Account Number: [id number] Date of Birth/Sex: July 26, 1955 (59 y.o. Female) Treating RN: Cornell Barman Primary Care Physician: [name] Other Clinician: Referring Physician: [name] Treating Physician/Extender: Frann Rider in Treatment: 110 Vital Signs Height(in): 65 Pulse(bpm): 62 Weight(lbs): Blood Pressure 193/57 (mmHg): Body Mass Index(BMI): Temperature(F): 98.1 Respiratory Rate  18 (breaths/min): Photos: [55:No Photos] [56:No Photos] [57:No Photos] Wound Location: [55:Right Lower Leg - Lateral, Left Lower Leg - Medial Proximal] [57:Left Lower Leg] Wounding Event: [55:Blister] [56:Gradually Appeared] [57:Gradually Appeared] Primary Etiology: [55:Lymphedema] [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] Comorbid History: [55:Lymphedema, Hypertension, Type II Diabetes] [56:Lymphedema, Hypertension, Type II Diabetes] [57:Lymphedema, Hypertension, Type II Diabetes] Date Acquired: [55:04/30/2015] [56:05/04/2015] [57:05/04/2015] Weeks of Treatment: [55:1] [56:0] [57:0] Wound Status: [55:Healed - Epithelialized] [56:Open] [57:Open] Clustered Wound: [55:Yes] [56:No] [57:No] Measurements L x W x D 0x0x0 [56:2x8.2x0.1] [57:1.2x0.8x0.1] (cm) Area (cm) : [55:0] [56:12.881] [57:0.754] Volume (cm) : [55:0] [56:1.288] [57:0.075] % Reduction in Area: [55:100.00%] [56:N/A] [57:N/A] % Reduction in Volume: 100.00% [56:N/A] [57:N/A] Classification: [55:Partial Thickness] [56:Grade 1] [57:Grade 1] HBO Classification: [55:Grade 1] [56:N/A] [57:N/A] Exudate Amount: [55:Large] [56:Medium] [57:Medium] Exudate Type: [55:Serous] [56:Serous] [57:Serous] Exudate Color: [55:amber] [56:amber] [57:amber] Wound  Margin: [55:Flat and Intact] [56:Flat and Intact] [57:Flat and Intact] Granulation Amount: [55:Large (67-100%)] [56:Large (67-100%)] [57:Large (67-100%)] Granulation Quality: [55:Pink] [56:Red, Hyper-granulation] [57:Red] Necrotic Amount: [55:None Present (0%)] [56:Small (1-33%)] [57:N/A] Exposed Structures: [55:Fascia: No Fat: No] [56:Fascia: No Fat: No] [57:Fascia: No Fat: No] Tendon: No Tendon: No Tendon: No Muscle: No Muscle: No Muscle: No Joint: No Joint: No Joint: No Bone: No Bone: No Bone: No Limited to Skin Limited to Skin Limited to Skin Breakdown Breakdown Breakdown Epithelialization: Medium (34-66%) N/A N/A Periwound Skin Texture: Edema: No Edema: No Edema: No Excoriation: No Excoriation: No Excoriation: No Induration: No Induration: No Induration: No Callus: No Callus: No Callus: No Crepitus: No Crepitus: No Crepitus: No Fluctuance: No Fluctuance: No Fluctuance: No Friable: No Friable: No Friable: No Rash: No Rash: No Rash: No Scarring: No Scarring: No Scarring: No Periwound Skin Maceration: No Maceration: Yes Maceration: No Moisture: Moist: No Moist: Yes Moist: No Dry/Scaly: No Dry/Scaly: No Dry/Scaly: No Periwound Skin Color: Atrophie Blanche: No Atrophie Blanche: No Atrophie Blanche: No Cyanosis: No Cyanosis: No Cyanosis: No Ecchymosis: No Ecchymosis: No Ecchymosis: No Erythema: No Erythema: No Erythema: No Hemosiderin Staining: No Hemosiderin Staining: No Hemosiderin Staining: No Mottled: No Mottled: No Mottled: No Pallor: No Pallor: No Pallor: No Rubor: No Rubor: No Rubor: No Tenderness on Yes No No Palpation: Wound Preparation: Ulcer Cleansing: Other: Ulcer Cleansing: N/A soap and water Rinsed/Irrigated with Saline Topical Anesthetic Applied: None Topical Anesthetic Applied: Other Treatment Notes Electronic Signature(s) Signed: 05/12/2015 5:36:16 PM By: Gretta Cool, RN, BSN, Kim RN, BSN Entered By: Gretta Cool, RN, BSN,  Kim on 05/12/2015 16:34:18 [name] ([id number]) -------------------------------------------------------------------------------- Multi-Disciplinary Care Plan Details Patient Name: [name] Date of Service: 05/12/2015 2:30 PM Medical Record Number: [id number] Patient Account Number: [id number] Date of Birth/Sex: Mar 31, 1956 (59 y.o. Female) Treating RN: Cornell Barman Primary Care Physician: [name] Other Clinician: Referring Physician: [name] Treating Physician/Extender: Frann Rider in Treatment: 110 Active Inactive Nutrition Nursing Diagnoses: Impaired glucose control: actual or potential Goals: Patient/caregiver verbalizes understanding of need to maintain therapeutic glucose control per primary care physician Date Initiated: 12/16/2013 Goal Status: Active Interventions: Provide education on elevated blood sugars and impact on wound healing Notes: Orientation to the Wound Care Program Nursing Diagnoses: Knowledge deficit related to the wound healing center program Goals: Patient/caregiver will verbalize understanding of the [facility] Date Initiated: 04/03/2013 Goal Status: Active Interventions: Provide education on orientation to the wound center Notes: Soft Tissue Infection Nursing Diagnoses: Impaired tissue integrity Goals: Patient will remain free of wound infection AYAAN, GOODELL ([id number]) Date Initiated: 12/16/2013 Goal Status:  Active Interventions: Assess signs and symptoms of infection every visit Notes: Venous Leg Ulcer Nursing Diagnoses: Knowledge deficit related to disease process and management Potential for venous Insuffiency (use before diagnosis confirmed) Goals: Patient will maintain optimal edema control Date Initiated: 08/18/2014 Goal Status: Active Interventions: Assess peripheral edema status every visit. Compression as ordered Treatment Activities: Therapeutic compression applied  : 05/12/2015 Notes: Electronic Signature(s) Signed: 05/12/2015 5:36:16 PM By: Gretta Cool, RN, BSN, Kim RN, BSN Entered By: Gretta Cool, RN, BSN, Kim on 05/12/2015 16:34:02 [name] ([id number]) -------------------------------------------------------------------------------- Pain Assessment Details Patient Name: [name]. Date of Service: 05/12/2015 2:30 PM Medical Record Number: [id number] Patient Account Number: [id number] Date of Birth/Sex: 1955-08-28 (59 y.o. Female) Treating RN: Cornell Barman Primary Care Physician: [name] Other Clinician: Referring Physician: [name] Treating Physician/Extender: Frann Rider in Treatment: 110 Active Problems Location of Pain Severity and Description of Pain Patient Has Paino No Site Locations Pain Management and Medication Current Pain Management: Electronic Signature(s) Signed: 05/12/2015 5:36:16 PM By: Gretta Cool, RN, BSN, Kim RN, BSN Entered By: Gretta Cool, RN, BSN, Kim on 05/12/2015 14:52:02 [name] ([id number]) -------------------------------------------------------------------------------- Patient/Caregiver Education Details Patient Name: [name] Date of Service: 05/12/2015 2:30 PM Medical Record Number: [id number] Patient Account Number: [id number] Date of Birth/Gender: [birth year] (59 y.o. Female) Treating RN: Cornell Barman Primary Care Physician: [name] Other Clinician: Referring Physician: [name] Treating Physician/Extender: Frann Rider in Treatment: 110 Education Assessment Education Provided To: Patient Education Topics Provided Wound/Skin Impairment: Handouts: Caring for Your Ulcer Methods: Demonstration, Explain/Verbal Responses: State content correctly Electronic Signature(s) Signed: 05/12/2015 5:36:16 PM By: Gretta Cool, RN, BSN, Kim RN, BSN Entered By: Gretta Cool, RN, BSN, Kim on 05/12/2015 16:39:55 [name]  ([id number]) -------------------------------------------------------------------------------- Wound Assessment Details Patient Name: [name] Date of Service: 05/12/2015 2:30 PM Medical Record Number: [id number] Patient Account Number: [id number] Date of Birth/Sex: 1956-04-21 (59 y.o. Female) Treating RN: Cornell Barman Primary Care Physician: [name] Other Clinician: Referring Physician: [name] Treating Physician/Extender: Frann Rider in Treatment: 110 Wound Status Wound Number: 55 Primary Lymphedema Etiology: Wound Location: Right Lower Leg - Lateral, Proximal Wound Status: Healed - Epithelialized Wounding Event: Blister Comorbid Lymphedema, Hypertension, Type II History: Diabetes Date Acquired: 04/30/2015 Weeks Of Treatment: 1 Clustered Wound: Yes Photos Photo Uploaded By: Gretta Cool, RN, BSN, Kim on 05/12/2015 17:38:16 Wound Measurements Length: (cm) 0 % Reduction Width: (cm) 0 % Reduction Depth: (cm) 0 Epitheliali Area: (cm) 0 Volume: (cm) 0 in Area: 100% in Volume: 100% zation: Medium (34-66%) Wound Description Classification: Partial Thickness Foul Odor A Diabetic Severity (Wagner): Grade 1 Wound Margin: Flat and Intact Exudate Amount: Large Exudate Type: Serous Exudate Color: amber fter Cleansing: No Wound Bed Granulation Amount: Large (67-100%) Exposed Structure Granulation Quality: Pink Fascia Exposed: No Lucien, [name] ([id number]) Necrotic Amount: None Present (0%) Fat Layer Exposed: No Tendon Exposed: No Muscle Exposed: No Joint Exposed: No Bone Exposed: No Limited to Skin Breakdown Periwound Skin Texture Texture Color No Abnormalities Noted: No No Abnormalities Noted: No Callus: No Atrophie Blanche: No Crepitus: No Cyanosis: No Excoriation: No Ecchymosis: No Fluctuance: No Erythema: No Friable: No Hemosiderin Staining: No Induration: No Mottled: No Localized Edema: No Pallor: No Rash: No Rubor:  No Scarring: No Temperature / Pain Moisture Tenderness on Palpation: Yes No Abnormalities Noted: No Dry / Scaly: No Maceration: No Moist: No Wound Preparation Ulcer Cleansing: Other: soap and water, Topical Anesthetic Applied: None Electronic Signature(s) Signed: 05/12/2015 5:36:16 PM By: Gretta Cool, RN, BSN, Kim RN, BSN  Entered By: Gretta Cool, RN, BSN, Kim on 05/12/2015 15:09:03 [name] ([id number]) -------------------------------------------------------------------------------- Wound Assessment Details Patient Name: [name] Date of Service: 05/12/2015 2:30 PM Medical Record Number: [id number] Patient Account Number: [id number] Date of Birth/Sex: Nov 27, 1955 (59 y.o. Female) Treating RN: Cornell Barman Primary Care Physician: [name] Other Clinician: Referring Physician: [name] Treating Physician/Extender: Frann Rider in Treatment: 110 Wound Status Wound Number: 56 Primary Diabetic Wound/Ulcer of the Lower Etiology: Extremity Wound Location: Left Lower Leg - Medial Wound Status: Open Wounding Event: Gradually Appeared Comorbid Lymphedema, Hypertension, Type II Date Acquired: 05/04/2015 History: Diabetes Weeks Of Treatment: 0 Clustered Wound: No Photos Photo Uploaded By: Gretta Cool, RN, BSN, Kim on 05/12/2015 17:38:33 Wound Measurements Length: (cm) 2 % Reduction i Width: (cm) 8.2 % Reduction i Depth: (cm) 0.1 Area: (cm) 12.881 Volume: (cm) 1.288 n Area: n Volume: Wound Description Classification: Grade 1 Wound Margin: Flat and Intact Exudate Amount: Medium Exudate Type: Serous Exudate Color: amber Wound Bed Granulation Amount: Large (67-100%) Exposed Structure Granulation Quality: Red, Hyper-granulation Fascia Exposed: No Necrotic Amount: Small (1-33%) Fat Layer Exposed: No Tendon Exposed: No [name], [name] ([id number]) Muscle Exposed: No Joint Exposed: No Bone Exposed: No Limited to Skin Breakdown Periwound Skin  Texture Texture Color No Abnormalities Noted: No No Abnormalities Noted: No Callus: No Atrophie Blanche: No Crepitus: No Cyanosis: No Excoriation: No Ecchymosis: No Fluctuance: No Erythema: No Friable: No Hemosiderin Staining: No Induration: No Mottled: No Localized Edema: No Pallor: No Rash: No Rubor: No Scarring: No Moisture No Abnormalities Noted: No Dry / Scaly: No Maceration: Yes Moist: Yes Wound Preparation Ulcer Cleansing: Rinsed/Irrigated with Saline Topical Anesthetic Applied: Other Treatment Notes Wound #56 (Left, Medial Lower Leg) 1. Cleansed with: Cleanse wound with antibacterial soap and water 4. Dressing Applied: Aquacel Ag 5. Secondary Dressing Applied Bordered Foam Dressing 7. Secured with Other (specify in notes) Notes Juxtafit bilateral Electronic Signature(s) Signed: 05/12/2015 5:36:16 PM By: Gretta Cool, RN, BSN, Kim RN, BSN Entered By: Gretta Cool, RN, BSN, Kim on 05/12/2015 15:07:45 [name] ([id number]) -------------------------------------------------------------------------------- Wound Assessment Details Patient Name: [name] Date of Service: 05/12/2015 2:30 PM Medical Record Number: [id number] Patient Account Number: [id number] Date of Birth/Sex: 1956-03-01 (59 y.o. Female) Treating RN: Cornell Barman Primary Care Physician: [name] Other Clinician: Referring Physician: [name] Treating Physician/Extender: Frann Rider in Treatment: 110 Wound Status Wound Number: 57 Primary Diabetic Wound/Ulcer of the Lower Etiology: Extremity Wound Location: Left Lower Leg Wound Status: Open Wounding Event: Gradually Appeared Comorbid Lymphedema, Hypertension, Type II Date Acquired: 05/04/2015 History: Diabetes Weeks Of Treatment: 0 Clustered Wound: No Photos Photo Uploaded By: Gretta Cool, RN, BSN, Kim on 05/12/2015 17:38:34 Wound Measurements Length: (cm) 1.2 % Reduction i Width: (cm) 0.8 % Reduction i Depth: (cm)  0.1 Area: (cm) 0.754 Volume: (cm) 0.075 n Area: n Volume: Wound Description Classification: Grade 1 Wound Margin: Flat and Intact Exudate Amount: Medium Exudate Type: Serous Exudate Color: amber Wound Bed Granulation Amount: Large (67-100%) Exposed Structure Granulation Quality: Red Fascia Exposed: No Fat Layer Exposed: No Tendon Exposed: No [name], [name] ([id number]) Muscle Exposed: No Joint Exposed: No Bone Exposed: No Limited to Skin Breakdown Periwound Skin Texture Texture Color No Abnormalities Noted: No No Abnormalities Noted: No Callus: No Atrophie Blanche: No Crepitus: No Cyanosis: No Excoriation: No Ecchymosis: No Fluctuance: No Erythema: No Friable: No Hemosiderin Staining: No Induration: No Mottled: No Localized Edema: No Pallor: No Rash: No Rubor: No Scarring: No Moisture No Abnormalities Noted: No Dry / Scaly:  No Maceration: No Moist: No Treatment Notes Wound #57 (Left Lower Leg) 1. Cleansed with: Cleanse wound with antibacterial soap and water 4. Dressing Applied: Aquacel Ag 5. Secondary Dressing Applied Bordered Foam Dressing 7. Secured with Other (specify in notes) Notes Juxtafit bilateral Electronic Signature(s) Signed: 05/12/2015 5:36:16 PM By: Gretta Cool, RN, BSN, Kim RN, BSN Entered By: Gretta Cool, RN, BSN, Kim on 05/12/2015 15:10:43 [name] ([id number]) -------------------------------------------------------------------------------- Vitals Details Patient Name: [name] Date of Service: 05/12/2015 2:30 PM Medical Record Number: [id number] Patient Account Number: [id number] Date of Birth/Sex: 1956-05-22 (59 y.o. Female) Treating RN: Cornell Barman Primary Care Physician: [name] Other Clinician: Referring Physician: [name] Treating Physician/Extender: Frann Rider in Treatment: 110 Vital Signs Time Taken: 14:52 Temperature (F): 98.1 Height (in): 65 Pulse (bpm): 62 Respiratory Rate  (breaths/min): 18 Blood Pressure (mmHg): 193/57 Reference Range: 80 - 120 mg / dl Electronic Signature(s) Signed: 05/12/2015 5:36:16 PM By: Gretta Cool, RN, BSN, Kim RN, BSN Entered By: Gretta Cool, RN, BSN, Kim on 05/12/2015 14:52:26

## 2015-05-18 ENCOUNTER — Ambulatory Visit: Payer: Managed Care, Other (non HMO) | Admitting: Surgery

## 2015-05-19 ENCOUNTER — Encounter: Payer: Managed Care, Other (non HMO) | Admitting: Surgery

## 2015-05-19 DIAGNOSIS — E11622 Type 2 diabetes mellitus with other skin ulcer: Secondary | ICD-10-CM | POA: Diagnosis not present

## 2015-05-20 NOTE — Progress Notes (Signed)
[name], [name] ([id number]) Visit Report for 05/19/2015 Arrival Information Details Patient Name: [name], [name]. Date of Service: 05/19/2015 3:30 PM Medical Record Number: [id number] Patient Account Number: [id number] Date of Birth/Sex: [birth year] (59 y.o. Female) Treating RN: Cornell Barman Primary Care Physician: [name] Other Clinician: Referring Physician: [name] Treating Physician/Extender: Frann Rider in Treatment: 50 Visit Information History Since Last Visit Added or deleted any medications: No Patient Arrived: Wheel Chair Any new allergies or adverse reactions: No Arrival Time: 14:47 Had a fall or experienced change in No Accompanied By: self activities of daily living that may affect Transfer Assistance: Manual risk of falls: Patient Identification Verified: Yes Signs or symptoms of abuse/neglect since last No Secondary Verification Process Yes visito Completed: Hospitalized since last visit: No Patient Requires Transmission- No Has Dressing in Place as Prescribed: Yes Based Precautions: Pain Present Now: No Patient Has Alerts: Yes Patient Alerts: Patient on Blood Thinner Electronic Signature(s) Signed: 05/19/2015 4:18:45 PM By: Alric Quan Entered By: Alric Quan on 05/19/2015 16:17:39 [name] ([id number]) -------------------------------------------------------------------------------- Clinic Level of Care Assessment Details Patient Name: [name] Date of Service: 05/19/2015 3:30 PM Medical Record Number: [id number] Patient Account Number: [id number] Date of Birth/Sex: [birth year] (59 y.o. Female) Treating RN: Ahmed Prima Primary Care Physician: [name] Other Clinician: Referring Physician: [name] Treating Physician/Extender: Frann Rider in Treatment: 111 Clinic Level of Care Assessment Items TOOL 3 Quantity Score X - Use when EandM and Procedure is performed on FOLLOW-UP  visit 1 0 ASSESSMENTS - Nursing Assessment / Reassessment []  - Reassessment of Co-morbidities (includes updates in patient status) 0 []  - Reassessment of Adherence to Treatment Plan 0 ASSESSMENTS - Wound and Skin Assessment / Reassessment []  - Points for Wound Assessment can only be taken for a new wound of unknown 0 or different etiology and a procedure is NOT performed to that wound []  - Simple Wound Assessment / Reassessment - one wound 0 X - Complex Wound Assessment / Reassessment - multiple wounds 1 5 []  - Dermatologic / Skin Assessment (not related to wound area) 0 ASSESSMENTS - Focused Assessment X - Circumferential Edema Measurements - multi extremities 1 5 []  - Nutritional Assessment / Counseling / Intervention 0 []  - Lower Extremity Assessment (monofilament, tuning fork, pulses) 0 []  - Peripheral Arterial Disease Assessment (using hand held doppler) 0 ASSESSMENTS - Ostomy and/or Continence Assessment and Care []  - Incontinence Assessment and Management 0 []  - Ostomy Care Assessment and Management (repouching, etc.) 0 PROCESS - Coordination of Care []  - Points for Discharge Coordination can only be taken for a new wound of 0 unknown or different etiology and a procedure is NOT performed to that wound []  - Simple Patient / Family Education for ongoing care 0 X - Complex (extensive) Patient / Family Education for ongoing care 1 20 [name], [name] ([id number]) []  - Staff obtains Consents, Records, Test Results / Process Orders 0 []  - Staff telephones HHA, Nursing Homes / Clarify orders / etc 0 []  - Routine Transfer to another Facility (non-emergent condition) 0 []  - Routine Hospital Admission (non-emergent condition) 0 []  - New Admissions / Biomedical engineer / Ordering NPWT, Apligraf, etc. 0 []  - Emergency Hospital Admission (emergent condition) 0 []  - Simple Discharge Coordination 0 []  - Complex (extensive) Discharge Coordination 0 PROCESS - Special Needs []  -  Pediatric / Minor Patient Management 0 []  - Isolation Patient Management 0 []  - Hearing / Language / Visual special needs 0 []  - Assessment of  Community assistance (transportation, D/C planning, etc.) 0 []  - Additional assistance / Altered mentation 0 []  - Support Surface(s) Assessment (bed, cushion, seat, etc.) 0 INTERVENTIONS - Wound Cleansing / Measurement []  - Points for Wound Cleaning / Measurement, Wound Dressing, Specimen 0 Collection and Specimen taken to lab can only be taken for a new wound of unknown or different etiology and a procedure is NOT performed to that wound []  - Simple Wound Cleansing - one wound 0 X - Complex Wound Cleansing - multiple wounds 1 5 X - Wound Imaging (photographs - any number of wounds) 1 5 []  - Wound Tracing (instead of photographs) 0 []  - Simple Wound Measurement - one wound 0 X - Complex Wound Measurement - multiple wounds 1 5 INTERVENTIONS - Wound Dressings []  - Small Wound Dressing one or multiple wounds 0 [name], [name] ([id number]) []  - Medium Wound Dressing one or multiple wounds 0 X - Large Wound Dressing one or multiple wounds 1 20 INTERVENTIONS - Miscellaneous []  - External ear exam 0 []  - Specimen Collection (cultures, biopsies, blood, body fluids, etc.) 0 []  - Specimen(s) / Culture(s) sent or taken to Lab for analysis 0 []  - Patient Transfer (multiple staff / Civil Service fast streamer / Similar devices) 0 []  - Simple Staple / Suture removal (25 or less) 0 []  - Complex Staple / Suture removal (26 or more) 0 []  - Hypo / Hyperglycemic Management (close monitor of Blood Glucose) 0 []  - Ankle / Brachial Index (ABI) - do not check if billed separately 0 []  - Vital Signs 0 Has the patient been seen at the hospital within the last three years: Yes Total Score: 65 Level Of Care: New/Established - Level 2 Electronic Signature(s) Signed: 05/19/2015 4:16:33 PM By: Alric Quan Entered By: Alric Quan on 05/19/2015 15:46:38 [name]  ([id number]) -------------------------------------------------------------------------------- Encounter Discharge Information Details Patient Name: [name]. Date of Service: 05/19/2015 3:30 PM Medical Record Number: [id number] Patient Account Number: [id number] Date of Birth/Sex: 25-Jul-1955 (59 y.o. Female) Treating RN: Ahmed Prima Primary Care Physician: [name] Other Clinician: Referring Physician: [name] Treating Physician/Extender: Frann Rider in Treatment: 111 Encounter Discharge Information Items Discharge Pain Level: 0 Discharge Condition: Stable Ambulatory Status: Walker Discharge Destination: Home Transportation: Private Auto Accompanied By: self Schedule Follow-up Appointment: Yes Medication Reconciliation completed and provided to Patient/Care Yes Debby Clyne: Provided on Clinical Summary of Care: 05/19/2015 Form Type Recipient Paper Patient GI Electronic Signature(s) Signed: 05/19/2015 4:16:33 PM By: Alric Quan Previous Signature: 05/19/2015 4:02:12 PM Version By: Ruthine Dose Entered By: Alric Quan on 05/19/2015 16:14:43 [name] ([id number]) -------------------------------------------------------------------------------- Lower Extremity Assessment Details Patient Name: [name] Date of Service: 05/19/2015 3:30 PM Medical Record Number: [id number] Patient Account Number: [id number] Date of Birth/Sex: [birth year] (59 y.o. Female) Treating RN: Cornell Barman Primary Care Physician: [name] Other Clinician: Referring Physician: [name] Treating Physician/Extender: Frann Rider in Treatment: 111 Edema Assessment Assessed: Shirlyn Goltz: No] [Right: No] E[Left: dema] [Right: :] Calf Left: Right: Point of Measurement: 24 cm From Medial Instep 52.4 cm 44 cm Ankle Left: Right: Point of Measurement: 8 cm From Medial Instep 25.8 cm 24 cm Vascular Assessment Pulses: Posterior Tibial Dorsalis  Pedis Palpable: [Left:Yes] [Right:Yes] Extremity colors, hair growth, and conditions: Extremity Color: [Left:Hyperpigmented] [Right:Hyperpigmented] Hair Growth on Extremity: [Left:No] [Right:No] Temperature of Extremity: [Left:Warm] [Right:Warm] Capillary Refill: [Left:< 3 seconds] [Right:< 3 seconds] Toe Nail Assessment Left: Right: Thick: Yes Yes Discolored: Yes Yes Deformed: No No Improper Length and Hygiene: No No  Electronic Signature(s) Signed: 05/19/2015 4:18:45 PM By: Alric Quan Signed: 05/19/2015 5:37:09 PM By: Gretta Cool RN, BSN, Kim RN, BSN Entered By: Alric Quan on 05/19/2015 16:17:57 [name] ([name] ([id number]) -------------------------------------------------------------------------------- Multi Wound Chart Details Patient Name: [name]. Date of Service: 05/19/2015 3:30 PM Medical Record Number: [id number] Patient Account Number: [id number] Date of Birth/Sex: Nov 20, 1955 (59 y.o. 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] [58:Lymphedema] [N/A:N/A] Secondary Etiology: [56:N/A] [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] [N/A:N/A] Comorbid History: [56:N/A] [58:Lymphedema, Hypertension, Type II Diabetes] [N/A:N/A] Date Acquired: [56:05/04/2015] [58:05/15/2015] [N/A:N/A] Weeks of Treatment: [56:1] [58:0] [N/A:N/A] Wound  Status: [56:Open] [58:Open] [N/A:N/A] Clustered Wound: [56:Yes] [58:Yes] [N/A:N/A] Measurements L x W x D 10x43x0.1 [58:4x30x0.1] [N/A:N/A] (cm) Area (cm) : [id number] [58:94.248] [N/A:N/A] Volume (cm) : [id number] [58:9.425] [N/A:N/A] % Reduction in Area: [56:-2521.90%] [58:N/A] [N/A:N/A] % Reduction in Volume: -2522.00% [58:N/A] [N/A:N/A] Classification: [56:Grade 1] [58:Partial Thickness] [N/A:N/A] HBO Classification: [56:N/A] [58:Grade 1] [N/A:N/A] Exudate Amount: [56:N/A] [58:Large] [N/A:N/A] Exudate Type: [56:N/A] [58:Serous] [N/A:N/A] Exudate Color: [56:N/A] [58:amber] [N/A:N/A] Wound Margin: [56:N/A] [58:Indistinct, nonvisible] [N/A:N/A] Granulation Amount: [56:N/A] [58:Small (1-33%)] [N/A:N/A] Granulation Quality: [56:N/A] [58:Red] [N/A:N/A] Necrotic Amount: [56:N/A] [58:Large (67-100%)] [N/A:N/A] Periwound Skin Texture: No Abnormalities Noted Scarring: Yes N/A Edema: No Excoriation: No Induration: No Callus: No Crepitus: No Fluctuance: No Friable: No Rash: No Periwound Skin No Abnormalities Noted Maceration: Yes N/A Moisture: Moist: No Dry/Scaly: No Periwound Skin Color: No Abnormalities Noted Atrophie Blanche: No N/A Cyanosis: No Ecchymosis: No Erythema: No Hemosiderin Staining: No Mottled: No Pallor: No Rubor: No Tenderness on No No N/A Palpation: Wound Preparation: N/A Ulcer Cleansing: N/A Rinsed/Irrigated with Saline Topical Anesthetic Applied: None Treatment Notes Electronic Signature(s) Signed: 05/19/2015 4:16:33 PM By: Alric Quan Entered By: Alric Quan on 05/19/2015 15:30:56 [name] ([id number]) -------------------------------------------------------------------------------- [place] Details Patient Name: [name]. Date of Service: 05/19/2015 3:30 PM Medical Record Number: [id number] Patient Account Number: [id number] Date of Birth/Sex: 1956-02-11 (59 y.o. Female) Treating RN:  Ahmed Prima Primary Care Physician: [name] Other Clinician: Referring Physician: [name] Treating Physician/Extender: Frann Rider in Treatment: 111 Active Inactive Nutrition Nursing Diagnoses: Impaired glucose control: actual or potential Goals: Patient/caregiver verbalizes understanding of need to maintain therapeutic glucose control per primary care physician Date Initiated: 12/16/2013 Goal Status: Active Interventions: Provide education on elevated blood sugars and impact on wound healing Notes: Orientation to the Wound Care Program Nursing Diagnoses: Knowledge deficit related to the wound healing center program Goals: Patient/caregiver will verbalize understanding of the [place] Program Date Initiated: 04/03/2013 Goal Status: Active Interventions: Provide education on orientation to the wound center Notes: Soft Tissue Infection Nursing Diagnoses: Impaired tissue integrity Goals: Patient will remain free of wound infection [name], [name] ([id number]) Date Initiated: 12/16/2013 Goal Status: Active Interventions: Assess signs and symptoms of infection every visit Notes: Venous Leg Ulcer Nursing Diagnoses: Knowledge deficit related to disease process and management Potential for venous Insuffiency (use before diagnosis confirmed) Goals: Patient will maintain optimal edema control Date Initiated: 08/18/2014 Goal Status: Active Interventions: Assess peripheral edema status every visit. Compression as ordered Treatment Activities:  Therapeutic compression applied : 05/19/2015 Notes: Electronic Signature(s) Signed: 05/19/2015 4:16:33 PM By: Alric Quan Entered By: Alric Quan on 05/19/2015 15:30:43 [name] ([id number]) -------------------------------------------------------------------------------- Pain Assessment Details Patient Name: [name]. Date of Service: 05/19/2015 3:30 PM Medical  Record Number: [id number] Patient Account Number: [id number] Date of Birth/Sex: [birth year] (59 y.o. Female) Treating RN: Cornell Barman Primary Care Physician: [name] Other Clinician: Referring Physician: [name] Treating Physician/Extender: Frann Rider in Treatment: 111 Active Problems Location of Pain Severity and Description of Pain Patient Has Paino No Site Locations Pain Management and Medication Current Pain Management: Electronic Signature(s) Signed: 05/19/2015 4:18:45 PM By: Alric Quan Signed: 05/19/2015 5:37:09 PM By: Gretta Cool RN, BSN, Kim RN, BSN Entered By: Alric Quan on 05/19/2015 16:17:47 [name] ([id number]) -------------------------------------------------------------------------------- Patient/Caregiver Education Details Patient Name: [name] Date of Service: 05/19/2015 3:30 PM Medical Record Number: [id number] Patient Account Number: [id number] Date of Birth/Gender: 1955-09-13 (59 y.o. Female) Treating RN: Ahmed Prima Primary Care Physician: [name] Other Clinician: Referring Physician: [name] Treating Physician/Extender: Frann Rider in Treatment: 111 Education Assessment Education Provided To: Patient Education Topics Provided Wound/Skin Impairment: Handouts: Other: do not get wraps wet Methods: Explain/Verbal Responses: State content correctly Electronic Signature(s) Signed: 05/19/2015 4:16:33 PM By: Alric Quan Entered By: Alric Quan on 05/19/2015 16:15:36 [name] ([id number]) -------------------------------------------------------------------------------- Wound Assessment Details Patient Name: [name]. Date of Service: 05/19/2015 3:30 PM Medical Record Number: [id number] Patient Account Number: [id number] Date of Birth/Sex: [birth year] (59 y.o. Female) Treating RN: Cornell Barman Primary Care Physician: [name] Other Clinician: Referring  Physician: [name] Treating Physician/Extender: Frann Rider in Treatment: 111 Wound Status Wound Number: 56 Primary Diabetic Wound/Ulcer of the Lower Etiology: Extremity Wound Location: Left, Medial Lower Leg Wound Status: Open Wounding Event: Gradually Appeared Date Acquired: 05/04/2015 Weeks Of Treatment: 1 Clustered Wound: Yes Photos Photo Uploaded By: Gretta Cool, RN, BSN, Kim on 05/19/2015 17:27:06 Wound Measurements Length: (cm) 10 Width: (cm) 43 Depth: (cm) 0.1 Area: (cm) 337.721 Volume: (cm) 33.772 % Reduction in Area: -2521.9% % Reduction in Volume: -2522% Wound Description Classification: Grade 1 Periwound Skin Texture Texture Color No Abnormalities Noted: No No Abnormalities Noted: No Moisture No Abnormalities Noted: No Treatment Notes Wound #56 (Left, Medial Lower Leg) 1. Cleansed withALIANNA, [name] ([id number]) Clean wound with Normal Saline 2. Anesthetic Topical Lidocaine 4% cream to wound bed prior to debridement 4. Dressing Applied: Aquacel Ag Other dressing (specify in notes) 7. Secured with Catering manager Bilaterally Notes Engineer, manufacturing) Signed: 05/19/2015 5:37:09 PM By: Gretta Cool, RN, BSN, Kim RN, BSN Entered By: Gretta Cool, RN, BSN, Kim on 05/19/2015 15:06:07 [name] ([id number]) -------------------------------------------------------------------------------- Wound Assessment Details Patient Name: [name] Date of Service: 05/19/2015 3:30 PM Medical Record Number: [id number] Patient Account Number: [id number] Date of Birth/Sex: [birth year] (59 y.o. Female) Treating RN: Cornell Barman Primary Care Physician: [name] Other Clinician: Referring Physician: [name] Treating Physician/Extender: Frann Rider in Treatment: 111 Wound Status Wound Number: 58 Primary Lymphedema Etiology: Wound Location: Right Lower Leg - Circumfernential Secondary Diabetic Wound/Ulcer of  the Lower Etiology: Extremity Wounding Event: Blister Wound Status: Open Date Acquired: 05/15/2015 Comorbid Lymphedema, Hypertension, Type Weeks Of Treatment: 0 History: II Diabetes Clustered Wound: Yes Photos Photo Uploaded By: Gretta Cool, RN, BSN, Kim on 05/19/2015 17:27:31 Wound Measurements Length: (cm) 4 % Reducti Width: (cm) 30 % Reducti Depth: (cm) 0.1 Area: (cm) 94.248 Volume: (cm) 9.425 on in Area: on in Volume: Wound  Description Classification: Partial Thickness Diabetic Severity Earleen Newport): Grade 1 Wound Margin: Indistinct, nonvisib Exudate Amount: Large Exudate Type: Serous Exudate Color: amber Foul Odor After Cleansing: No le Wound Bed Granulation Amount: Small (1-33%) Exposed Structure Granulation Quality: Red Fascia Exposed: No [name], [name] ([id number]) Necrotic Amount: Large (67-100%) Fat Layer Exposed: No Necrotic Quality: Adherent Slough Tendon Exposed: No Muscle Exposed: No Joint Exposed: No Bone Exposed: No Limited to Skin Breakdown Periwound Skin Texture Texture Color No Abnormalities Noted: No No Abnormalities Noted: No Callus: No Atrophie Blanche: No Crepitus: No Cyanosis: No Excoriation: No Ecchymosis: No Fluctuance: No Erythema: No Friable: No Hemosiderin Staining: No Induration: No Mottled: No Localized Edema: No Pallor: No Rash: No Rubor: No Scarring: Yes Moisture No Abnormalities Noted: No Dry / Scaly: No Maceration: Yes Moist: No Wound Preparation Ulcer Cleansing: Rinsed/Irrigated with Saline Topical Anesthetic Applied: None Treatment Notes Wound #58 (Right, Circumferential Lower Leg) 1. Cleansed with: Clean wound with Normal Saline 2. Anesthetic Topical Lidocaine 4% cream to wound bed prior to debridement 4. Dressing Applied: Aquacel Ag Other dressing (specify in notes) 7. Secured with Catering manager Bilaterally Notes Engineer, manufacturing) Signed: 05/19/2015 5:37:09 PM By:  Gretta Cool, RN, BSN, Kim RN, BSN [name], [name] Kitchen ([id number]) Entered By: Gretta Cool, RN, BSN, Kim on 05/19/2015 15:08:45 [name] ([id number]) -------------------------------------------------------------------------------- Vitals Details Patient Name: [name] Date of Service: 05/19/2015 3:30 PM Medical Record Number: [id number] Patient Account Number: [id number] Date of Birth/Sex: 12/14/1955 (59 y.o. Female) Treating RN: Cornell Barman Primary Care Physician: [name] Other Clinician: Referring Physician: [name] Treating Physician/Extender: Frann Rider in Treatment: 111 Vital Signs Time Taken: 14:48 Temperature (F): 97.6 Height (in): 65 Pulse (bpm): 61 Respiratory Rate (breaths/min): 18 Blood Pressure (mmHg): 216/77 Reference Range: 80 - 120 mg / dl Electronic Signature(s) Signed: 05/19/2015 4:18:45 PM By: Alric Quan Entered By: Alric Quan on 05/19/2015 16:17:52

## 2015-05-20 NOTE — Progress Notes (Signed)
SAKAE, EULL ([id number]) Visit Report for 05/19/2015 Chief Complaint Document Details Patient Name: [name], [name]. Date of Service: 05/19/2015 3:30 PM Medical Record Number: [id number] Patient Account Number: [id number] Date of Birth/Sex: 1955-11-09 (59 y.o. 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 Electronic Signature(s) Signed: 05/19/2015 4:17:39 PM By: Christin Fudge MD, FACS Entered By: Christin Fudge on 05/19/2015 16:17:39 [name] ([id number]) -------------------------------------------------------------------------------- HPI Details Patient Name: [name] Date of Service: 05/19/2015 3:30 PM Medical Record Number: [id number] Patient Account Number: [id number] Date of Birth/Sex: 06-16-1956 (59 y.o. Female) Treating RN: Montey Hora Primary Care Physician: [name] Other Clinician: Referring Physician: [name] Treating Physician/Extender: Frann Rider in Treatment: 56 History of Present Illness HPI Description: 59yo w/ h/o BLE phlebolymphedema, type 2 DM, and obesity. No PVD. h/o chronic, recurrent BLE calf ulcers. Treated with 4 layer compression. Infrequently uses lymphedema pump. Bilateral lower extremity ulcerations healed as of June 2016. Fitted for custom compression stockings but not yet received. Awaiting lymphedema consult. She was transitioned to juxtalite compression garment on the left. Compression bandage applied to right leg on 02/25/2015. Patient has not followed up since that time, wearing same compression bandage. She returns to clinic today and has developed recurrent bilateral calf ulcerations. No significant pain. No fever  or chills. Moderate drainage. 04/07/2015 -- her hip surgery was canceled due to medical reasons and she is here without any compression today. I understand from Dr. Quay Burow note that she has been very noncompliant in the last time did not come back for over a month and had the same compression bandage. Today she does not have any compression and she says they were wet and hence she didn't wear them. 04/14/2015 -- she says she has got some transport organizer and will be here every week for regular appointments. She has been using her juxta lights at home. 05/12/2015 -- her right leg is now completely healed but she has developed 2 new blisters on her left leg which have opened out into ulcers. 05/19/2015 -- the patient has severe blisters and ulceration of the right lower extremity and this was completely healed last week. Her left leg has got significantly worse and this is now almost circumferential ulcerations. Of note though the patient tells [name] she is wearing compression stockings, and using her lymphedema pumps I do not see how she has such drastic changes the moment we stop wrapping her legs. I believe she is not being compliant and is not being truthful about her use of her compression stockings Electronic Signature(s) Signed: 05/19/2015 4:19:41 PM By: Christin Fudge MD, FACS Entered By: Christin Fudge on 05/19/2015 16:19:41 [name] ([id number]) -------------------------------------------------------------------------------- Physical Exam Details Patient Name: [name]. Date of Service: 05/19/2015 3:30 PM Medical Record Number: [id number] Patient Account Number: [id number] Date of Birth/Sex: 1955/11/09 (59 y.o. Female) Treating RN: Montey Hora Primary Care Physician: [name] Other Clinician: Referring Physician: [name] Treating Physician/Extender: Frann Rider in Treatment: 111 Constitutional . Pulse regular. Respirations normal and  unlabored. Afebrile. . Eyes Nonicteric. Reactive to light. Ears, Nose, Mouth, and Throat Lips, teeth, and gums WNL.Marland Kitchen Moist mucosa without lesions . Neck supple and nontender. No palpable supraclavicular or cervical adenopathy. Normal sized without goiter. Respiratory WNL. No retractions.. Cardiovascular Pedal Pulses WNL. she has weeping and bilateral stage  II lymphedema. Lymphatic No adneopathy. No adenopathy. No adenopathy. Musculoskeletal Adexa without tenderness or enlargement.. Digits and nails w/o clubbing, cyanosis, infection, petechiae, ischemia, or inflammatory conditions.. Integumentary (Hair, Skin) No suspicious lesions. No crepitus or fluctuance. No peri-wound warmth or erythema. No masses.Marland Kitchen Psychiatric Judgement and insight Intact.. No evidence of depression, anxiety, or agitation.. Notes the right leg which was completely healed last week now has several open blisters and intact blisters with significant dosing of fluid. The left lower extremity which was healed 2 weeks ago had 2 small open ulcerations last week and now is completely open with multiple circumferential ulcerations. Electronic Signature(s) Signed: 05/19/2015 4:20:44 PM By: Christin Fudge MD, FACS Entered By: Christin Fudge on 05/19/2015 16:20:44 [name] ([id number]) -------------------------------------------------------------------------------- Physician Orders Details Patient Name: [name] Date of Service: 05/19/2015 3:30 PM Medical Record Number: [id number] Patient Account Number: [id number] Date of Birth/Sex: 10-22-1955 (59 y.o. Female) Treating RN: Ahmed Prima Primary Care Physician: [name] Other Clinician: Referring Physician: [name] Treating Physician/Extender: Frann Rider in Treatment: [phone] Verbal / Phone Orders: Yes Clinician: Carolyne Fiscal, Debi Read Back and Verified: Yes Diagnosis Coding Wound Cleansing Wound #56 Left,Medial Lower Leg o  Clean wound with Normal Saline. Wound #56 Left,Circumferential Lower Leg o Clean wound with Normal Saline. Wound #58 Right,Circumferential Lower Leg o Clean wound with Normal Saline. Primary Wound Dressing Wound #56 Left,Medial Lower Leg o Aquacel Ag o Other: - Mextra Superabsorbent Wound #56 Left,Circumferential Lower Leg o Aquacel Ag o Other: - Mextra Superabsorbent Wound #58 Right,Circumferential Lower Leg o Aquacel Ag o Other: - Mextra Superabsorbent Follow-up Appointments Wound #56 Left,Medial Lower Leg o Return Appointment in 1 week. Wound #56 Left,Circumferential Lower Leg o Return Appointment in 1 week. Wound #58 Right,Circumferential Lower Leg o Return Appointment in 1 week. Edema Control Wound #56 Left,Medial Lower Leg [address] DEZHANE, OSTER ([id number]) Wound #56 Left,Circumferential Lower Leg o Unna Boots Bilaterally Wound #58 Right,Circumferential Lower Leg o Unna Boots Bilaterally Notes Dr. Con Memos ordered home health but patient refused to have home health come into the home. Electronic Signature(s) Signed: 05/19/2015 4:16:33 PM By: Alric Quan Signed: 05/19/2015 4:47:37 PM By: Christin Fudge MD, FACS Entered By: Alric Quan on 05/19/2015 16:04:21 [name] ([id number]) -------------------------------------------------------------------------------- Problem List Details Patient Name: [name]. Date of Service: 05/19/2015 3:30 PM Medical Record Number: [id number] Patient Account Number: [id number] Date of Birth/Sex: Dec 30, 1955 (59 y.o. Female) Treating RN: Montey Hora Primary Care Physician: [name] Other Clinician: Referring Physician: [name] Treating Physician/Extender: Frann Rider in Treatment: 111 Active Problems ICD-9 Encounter Code Description Active Date Diagnosis 457.1 Other Lymphedema - excludes congenital, eyelid or vulva 03/27/2013 Yes 459.33  Chronic venous hypertension with ulcer and inflammation 03/27/2013 Yes 707.12 Ulcer of lower limbs, except pressure ulcer; Ulcer of calf 03/27/2013 Yes ICD-10 Encounter Code Description Active Date Diagnosis I89.0 Lymphedema, not elsewhere classified 03/26/2014 Yes E66.9 Obesity, unspecified 03/26/2014 Yes E11.40 Type 2 diabetes mellitus with diabetic neuropathy, 01/14/2015 Yes unspecified R26.89 Other abnormalities of gait and mobility 01/14/2015 Yes 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 ([id number]) E11.622 Type 2 diabetes mellitus with other skin ulcer 02/25/2015 Yes L97.221 Non-pressure chronic ulcer of left calf limited to 05/12/2015 Yes breakdown of skin Inactive Problems Resolved Problems ICD-10 Code Description Active Date Resolved Date E11.621 Type 2 diabetes mellitus  with foot ulcer 10/08/2014 10/09/2014 L03.115 Cellulitis of right lower limb 02/25/2015 02/25/2015 L84 Corns and callosities 11/12/2014 11/12/2014 Electronic Signature(s) Signed: 05/19/2015 4:17:27 PM By: Christin Fudge MD, FACS Entered By: Christin Fudge on 05/19/2015 16:17:27 [name] ([id number]) -------------------------------------------------------------------------------- Progress Note Details Patient Name: [name]. Date of Service: 05/19/2015 3:30 PM Medical Record Number: [id number] Patient Account Number: [id number] Date of Birth/Sex: 1956/01/21 (58 y.o. 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 History of  Present Illness (HPI) 59yo w/ h/o BLE phlebolymphedema, type 2 DM, and obesity. No PVD. h/o chronic, recurrent BLE calf ulcers. Treated with 4 layer compression. Infrequently uses lymphedema pump. Bilateral lower extremity ulcerations healed as of June 2016. Fitted for custom compression stockings but not yet received. Awaiting lymphedema consult. She was transitioned to juxtalite compression garment on the left. Compression bandage applied to right leg on 02/25/2015. Patient has not followed up since that time, wearing same compression bandage. She returns to clinic today and has developed recurrent bilateral calf ulcerations. No significant pain. No fever or chills. Moderate drainage. 04/07/2015 -- her hip surgery was canceled due to medical reasons and she is here without any compression today. I understand from Dr. Quay Burow note that she has been very noncompliant in the last time did not come back for over a month and had the same compression bandage. Today she does not have any compression and she says they were wet and hence she didn't wear them. 04/14/2015 -- she says she has got some transport organizer and will be here every week for regular appointments. She has been using her juxta lights at home. 05/12/2015 -- her right leg is now completely healed but she has developed 2 new blisters on her left leg which have opened out into ulcers. 05/19/2015 -- the patient has severe blisters and ulceration of the right lower extremity and this was completely healed last week. Her left leg has got significantly worse and this is now almost circumferential ulcerations. Of note though the patient tells [name] she is wearing compression stockings, and using her lymphedema pumps I do not see how she has such drastic changes the moment we stop wrapping her legs. I believe she is not being compliant and is not being truthful about her use of her compression stockings Objective Chery, Cyani K.  ([id number]) Constitutional Pulse regular. Respirations normal and unlabored. Afebrile. Vitals Time Taken: 2:48 PM, Height: 65 in, Temperature: 97.6 F, Pulse: 61 bpm, Respiratory Rate: 18 breaths/min, Blood Pressure: 216/77 mmHg. Eyes Nonicteric. Reactive to light. Ears, Nose, Mouth, and Throat Lips, teeth, and gums WNL.Marland Kitchen Moist mucosa without lesions . Neck supple and nontender. No palpable supraclavicular or cervical adenopathy. Normal sized without goiter. Respiratory WNL. No retractions.. Cardiovascular Pedal Pulses WNL. she has weeping and bilateral stage II lymphedema. Lymphatic No adneopathy. No adenopathy. No adenopathy. Musculoskeletal Adexa without tenderness or enlargement.. Digits and nails w/o clubbing, cyanosis, infection, petechiae, ischemia, or inflammatory conditions.Marland Kitchen Psychiatric Judgement and insight Intact.. No evidence of depression, anxiety, or agitation.. General Notes: the right leg which was completely healed last week now has several open blisters and intact blisters with significant dosing of fluid. The left lower extremity which was healed 2 weeks ago had 2 small open ulcerations last week and now is completely open with multiple circumferential ulcerations. Integumentary (Hair,  Skin) No suspicious lesions. No crepitus or fluctuance. No peri-wound warmth or erythema. No masses.. Wound #56 status is Open. Original cause of wound was Gradually Appeared. The wound is located on the Left,Medial Lower Leg. The wound measures 10cm length x 43cm width x 0.1cm depth; 337.721cm^2 area and 33.772cm^3 volume. Wound #58 status is Open. Original cause of wound was Blister. The wound is located on the Right,Circumferential Lower Leg. The wound measures 4cm length x 30cm width x 0.1cm depth; 94.248cm^2 area and 9.425cm^3 volume. The wound is limited to skin breakdown. There is a large amount of serous drainage noted. The wound margin is indistinct and nonvisible. There  is small (1-33%) red granulation within the wound bed. There is a large (67-100%) amount of necrotic tissue within the wound bed including Adherent Slough. The periwound skin appearance exhibited: Scarring, Maceration. The ORIAH, HOLLENBACH ([id number]) periwound skin appearance did not exhibit: Callus, Crepitus, Excoriation, Fluctuance, Friable, Induration, Localized Edema, Rash, Dry/Scaly, Moist, Atrophie Blanche, Cyanosis, Ecchymosis, Hemosiderin Staining, Mottled, Pallor, Rubor, Erythema. 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 The moment we stop using on Unna's boots or 4 layer compression her wounds deteriorate significantly. I have again recommended Aquacel Ag and Unna's boots bilaterally and if need be home health needs to go and change it twice a week. I will also discuss with her that due to these open ulceration she should let her orthopedic surgeon in no as she has planned hip surgery in early December Plan Wound Cleansing: Wound #56 Left,Medial Lower Leg: Clean wound with Normal Saline. Wound #56 Left,Circumferential Lower Leg: Clean wound with Normal  Saline. Wound #58 Right,Circumferential Lower Leg: Stanislawski, Mennie K. ([id number]) Clean wound with Normal Saline. Primary Wound Dressing: Wound #56 Left,Medial Lower Leg: Aquacel Ag Other: - Mextra Superabsorbent Wound #56 Left,Circumferential Lower Leg: Aquacel Ag Other: - Mextra Superabsorbent Wound #58 Right,Circumferential Lower Leg: Aquacel Ag Other: - Mextra Superabsorbent Follow-up Appointments: Wound #56 Left,Medial Lower Leg: Return Appointment in 1 week. Wound #56 Left,Circumferential Lower Leg: Return Appointment in 1 week. Wound #58 Right,Circumferential Lower Leg: Return Appointment in 1 week. Edema Control: Wound #56 Left,Medial Lower Leg: Unna Boots Bilaterally Wound #56 Left,Circumferential Lower Leg: Unna Boots Bilaterally Wound #58 Right,Circumferential Lower Leg: Unna Boots Bilaterally General Notes: Dr. Con Memos ordered home health but patient refused to have home health come into the home. I firmly believe that this patient is not being compliant with her compression wraps and a lymphedema pumps at home. The moment we stop using on Unna's boots or 4 layer compression her wounds deteriorate significantly. I have again recommended Aquacel Ag and Unna's boots bilaterally and if need be home health needs to go and change it twice a week. I will also discuss with  her that due to these open ulceration she should let her orthopedic surgeon in no as she has planned hip surgery in early December Electronic Signature(s) Signed: 05/19/2015 4:22:29 PM By: Christin Fudge MD, FACS Entered By: Christin Fudge on 05/19/2015 16:22:29 [name] ([id number]) -------------------------------------------------------------------------------- SuperBill Details Patient Name: [name]. Date of Service: 05/19/2015 Medical Record Number: [id number] Patient Account Number: [id number] Date of Birth/Sex: 06-Oct-1955 (59 y.o. Female) Treating RN: Carolyne Fiscal, Debi Primary  Care Physician: [name] Other Clinician: Referring Physician: [name] Treating Physician/Extender: Frann Rider in Treatment: 111 Diagnosis Coding ICD-9 Codes Code Description 457.1 Other Lymphedema - excludes congenital, eyelid or vulva 459.33 Chronic venous hypertension with ulcer and inflammation 707.12 Ulcer of lower limbs, except pressure ulcer; Ulcer of calf ICD-10 Codes Code Description I89.0 Lymphedema, not elsewhere classified E66.9 Obesity, unspecified E11.40 Type 2 diabetes mellitus with diabetic neuropathy, unspecified R26.89 Other abnormalities of gait and mobility 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 I83.222 Varicose veins of left lower extremity with both ulcer of calf and inflammation E11.622 Type 2 diabetes mellitus with other skin ulcer L97.221 Non-pressure chronic ulcer of left calf limited to breakdown of skin Facility Procedures CPT4 Code: [id number] Description: 29580 - APPLY UNNA BOOT/PROFO BILATERAL Modifier: Quantity: 1 Physician Procedures CPT4: Description Modifier Quantity Code [id number] - WC PHYS LEVEL 3 - EST PT 1 ICD-10 Description Diagnosis I89.0 Lymphedema, not elsewhere classified 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 I83.222  Varicose veins of left lower extremity with both ulcer of calf and inflammation BREXLEIGH, SYBERT ([id number]) Electronic Signature(s) Signed: 05/19/2015 4:47:37 PM By: Christin Fudge MD, FACS Signed: 05/19/2015 5:37:09 PM By: Gretta Cool RN, BSN, Kim RN, BSN Previous Signature: 05/19/2015 4:24:43 PM Version By: Christin Fudge MD, FACS Previous Signature: 05/19/2015 4:16:33 PM Version By: Alric Quan Entered By: Gretta Cool RN, BSN, Kim on 05/19/2015 16:30:41

## 2015-05-25 ENCOUNTER — Encounter: Payer: Self-pay | Admitting: *Deleted

## 2015-05-25 ENCOUNTER — Other Ambulatory Visit: Payer: Managed Care, Other (non HMO)

## 2015-05-25 NOTE — Patient Instructions (Signed)
  Your procedure is scheduled on: 05-28-15 Report to [facility] To find out your arrival time please call [phone] between 1PM - 3PM on 05-27-15  Remember: Instructions that are not followed completely may result in serious medical risk, up to and including death, or upon the discretion of your surgeon and anesthesiologist your surgery may need to be rescheduled.    __X__ 1. Do not eat food or drink liquids after midnight. No gum chewing or hard candies.     __X__ 2. No Alcohol for 24 hours before or after surgery.   ____ 3. Bring all medications with you on the day of surgery if instructed.    ____ 4. Notify your doctor if there is any change in your medical condition     (cold, fever, infections).     Do not wear jewelry, make-up, hairpins, clips or nail polish.  Do not wear lotions, powders, or perfumes. You may wear deodorant.  Do not shave 48 hours prior to surgery. Men may shave face and neck.  Do not bring valuables to the hospital.    [facility] is not responsible for any belongings or valuables.               Contacts, dentures or bridgework may not be worn into surgery.  Leave your suitcase in the car. After surgery it may be brought to your room.  For patients admitted to the hospital, discharge time is determined by your treatment team.   Patients discharged the day of surgery will not be allowed to drive home.   Please read over the following fact sheets that you were given:     __X__ Take these medicines the morning of surgery with A SIP OF WATER:    1. CLONIDINE  2. LEVOTHYROXINE  3. METOPROLOL  4. MICARDIS  5.  6.  ____ Fleet Enema (as directed)   ____ Use CHG Soap as directed  ____ Use inhalers on the day of surgery  ____ Stop metformin 2 days prior to surgery    ____ Take 1/2 of usual insulin dose the night before surgery and none on the morning of surgery.   ____ Stop Coumadin/Plavix/aspirin-PT STOPPED ASA ON  05-20-15  ____ Stop Anti-inflammatories-NO NSAIDS OR ASA PRODUCTS-TRAMADOL/OXYCODONE OK TO CONTINUE OR TYLENOL    ____ Stop supplements until after surgery.    ____ Bring C-Pap to the hospital.

## 2015-05-27 ENCOUNTER — Encounter: Payer: Managed Care, Other (non HMO) | Admitting: Surgery

## 2015-05-27 DIAGNOSIS — E11622 Type 2 diabetes mellitus with other skin ulcer: Secondary | ICD-10-CM | POA: Diagnosis not present

## 2015-05-28 ENCOUNTER — Inpatient Hospital Stay: Payer: Managed Care, Other (non HMO) | Admitting: Certified Registered"

## 2015-05-28 ENCOUNTER — Inpatient Hospital Stay: Payer: Managed Care, Other (non HMO)

## 2015-05-28 ENCOUNTER — Inpatient Hospital Stay
Admission: RE | Admit: 2015-05-28 | Discharge: 2015-06-01 | DRG: 470 | Disposition: A | Discharge status: Skilled Nursing Facility | Payer: Managed Care, Other (non HMO) | Source: Ambulatory Visit | Attending: Orthopedic Surgery | Admitting: Orthopedic Surgery

## 2015-05-28 ENCOUNTER — Encounter
Admission: RE | Disposition: A | Discharge status: Skilled Nursing Facility | Payer: Self-pay | Source: Ambulatory Visit | Attending: Orthopedic Surgery

## 2015-05-28 ENCOUNTER — Other Ambulatory Visit: Payer: Self-pay | Admitting: Cardiology

## 2015-05-28 DIAGNOSIS — E039 Hypothyroidism, unspecified: Secondary | ICD-10-CM | POA: Diagnosis present

## 2015-05-28 DIAGNOSIS — Z7982 Long term (current) use of aspirin: Secondary | ICD-10-CM

## 2015-05-28 DIAGNOSIS — Z87442 Personal history of urinary calculi: Secondary | ICD-10-CM

## 2015-05-28 DIAGNOSIS — L89152 Pressure ulcer of sacral region, stage 2: Secondary | ICD-10-CM | POA: Diagnosis present

## 2015-05-28 DIAGNOSIS — M1611 Unilateral primary osteoarthritis, right hip: Principal | ICD-10-CM | POA: Diagnosis present

## 2015-05-28 DIAGNOSIS — Z809 Family history of malignant neoplasm, unspecified: Secondary | ICD-10-CM | POA: Diagnosis not present

## 2015-05-28 DIAGNOSIS — Z419 Encounter for procedure for purposes other than remedying health state, unspecified: Secondary | ICD-10-CM

## 2015-05-28 DIAGNOSIS — I129 Hypertensive chronic kidney disease with stage 1 through stage 4 chronic kidney disease, or unspecified chronic kidney disease: Secondary | ICD-10-CM | POA: Diagnosis present

## 2015-05-28 DIAGNOSIS — N184 Chronic kidney disease, stage 4 (severe): Secondary | ICD-10-CM | POA: Diagnosis present

## 2015-05-28 DIAGNOSIS — Z833 Family history of diabetes mellitus: Secondary | ICD-10-CM

## 2015-05-28 DIAGNOSIS — G8918 Other acute postprocedural pain: Secondary | ICD-10-CM

## 2015-05-28 DIAGNOSIS — L8990 Pressure ulcer of unspecified site, unspecified stage: Secondary | ICD-10-CM | POA: Insufficient documentation

## 2015-05-28 DIAGNOSIS — E119 Type 2 diabetes mellitus without complications: Secondary | ICD-10-CM | POA: Diagnosis present

## 2015-05-28 HISTORY — DX: Lymphedema, not elsewhere classified: I89.0

## 2015-05-28 HISTORY — DX: Unspecified open wound, unspecified lower leg, initial encounter: S81.809A

## 2015-05-28 HISTORY — PX: TOTAL HIP ARTHROPLASTY: SHX124

## 2015-05-28 LAB — BASIC METABOLIC PANEL
Anion gap: 8 (ref 5–15)
BUN: 15 mg/dL (ref 6–20)
CALCIUM: 9.4 mg/dL (ref 8.9–10.3)
CHLORIDE: 101 mmol/L (ref 101–111)
CO2: 25 mmol/L (ref 22–32)
CREATININE: 1.62 mg/dL — AB (ref 0.44–1.00)
GFR, EST AFRICAN AMERICAN: 39 mL/min — AB (ref >60)
GFR, EST NON AFRICAN AMERICAN: 33 mL/min — AB (ref >60)
Glucose, Bld: 191 mg/dL — ABNORMAL HIGH (ref 65–99)
Potassium: 4 mmol/L (ref 3.5–5.1)
SODIUM: 134 mmol/L — AB (ref 135–145)

## 2015-05-28 LAB — SURGICAL PCR SCREEN
MRSA, PCR: NEGATIVE
STAPHYLOCOCCUS AUREUS: NEGATIVE

## 2015-05-28 LAB — GLUCOSE, CAPILLARY
Glucose-Capillary: 149 mg/dL — ABNORMAL HIGH (ref 65–99)
Glucose-Capillary: 149 mg/dL — ABNORMAL HIGH (ref 65–99)
Glucose-Capillary: 132 mg/dL — ABNORMAL HIGH (ref 65–99)
Glucose-Capillary: 183 mg/dL — ABNORMAL HIGH (ref 65–99)

## 2015-05-28 LAB — CBC
HCT: 35.5 % (ref 35.0–47.0)
HEMOGLOBIN: 11.6 g/dL — AB (ref 12.0–16.0)
MCH: 28.5 pg (ref 26.0–34.0)
MCHC: 32.6 g/dL (ref 32.0–36.0)
MCV: 87.6 fL (ref 80.0–100.0)
Platelets: 272 10*3/uL (ref 150–440)
RBC: 4.05 MIL/uL (ref 3.80–5.20)
RDW: 13.2 % (ref 11.5–14.5)
WBC: 6.8 10*3/uL (ref 3.6–11.0)

## 2015-05-28 LAB — TYPE AND SCREEN
ABO/RH(D): O POS
Antibody Screen: NEGATIVE

## 2015-05-28 LAB — PROTIME-INR
INR: 1.22
PROTHROMBIN TIME: 15.6 s — AB (ref 11.4–15.0)

## 2015-05-28 LAB — APTT: aPTT: 25 seconds (ref 24–36)

## 2015-05-28 SURGERY — ARTHROPLASTY, HIP, TOTAL, ANTERIOR APPROACH
Anesthesia: General | Laterality: Right

## 2015-05-28 MED ORDER — NEOMYCIN-POLYMYXIN B GU 40-200000 IR SOLN
Status: AC
  Filled 2015-05-28: qty 4

## 2015-05-28 MED ORDER — ASPIRIN EC 81 MG PO TBEC
81.0000 mg | DELAYED_RELEASE_TABLET | Freq: Every day | ORAL | Status: DC
  Administered 2015-05-28 – 2015-06-01 (×5): 81 mg via ORAL
  Filled 2015-05-28 (×5): qty 1

## 2015-05-28 MED ORDER — ZOLPIDEM TARTRATE 5 MG PO TABS: 5.0000 mg | ORAL_TABLET | Freq: Every evening | ORAL | Status: DC | PRN

## 2015-05-28 MED ORDER — MORPHINE SULFATE (PF) 2 MG/ML IV SOLN
INTRAVENOUS | Status: AC
  Filled 2015-05-28: qty 1

## 2015-05-28 MED ORDER — NITROGLYCERIN 0.4 MG SL SUBL
SUBLINGUAL_TABLET | SUBLINGUAL | Status: AC
  Administered 2015-05-28: 0.4 mg via SUBLINGUAL
  Filled 2015-05-28: qty 1

## 2015-05-28 MED ORDER — FAMOTIDINE 20 MG PO TABS
ORAL_TABLET | ORAL | Status: AC
  Administered 2015-05-28: 20 mg via ORAL
  Filled 2015-05-28: qty 1

## 2015-05-28 MED ORDER — ACETAMINOPHEN 500 MG PO TABS
1000.0000 mg | ORAL_TABLET | Freq: Four times a day (QID) | ORAL | Status: AC
  Administered 2015-05-28 – 2015-05-29 (×3): 1000 mg via ORAL
  Filled 2015-05-28 (×5): qty 2

## 2015-05-28 MED ORDER — TAPENTADOL HCL 50 MG PO TABS
50.0000 mg | ORAL_TABLET | ORAL | Status: DC | PRN
  Administered 2015-05-28: 50 mg via ORAL
  Administered 2015-05-29 – 2015-05-30 (×4): 100 mg via ORAL
  Administered 2015-05-31: 50 mg via ORAL
  Filled 2015-05-28: qty 1
  Filled 2015-05-28 (×3): qty 2
  Filled 2015-05-28: qty 1
  Filled 2015-05-28: qty 2

## 2015-05-28 MED ORDER — CEFAZOLIN SODIUM-DEXTROSE 2-3 GM-% IV SOLR
2.0000 g | Freq: Once | INTRAVENOUS | Status: AC
  Administered 2015-05-28: 2 g via INTRAVENOUS

## 2015-05-28 MED ORDER — PHENOL 1.4 % MT LIQD: 1.0000 | OROMUCOSAL | Status: DC | PRN

## 2015-05-28 MED ORDER — DIPHENHYDRAMINE HCL 12.5 MG/5ML PO ELIX: 12.5000 mg | ORAL_SOLUTION | ORAL | Status: DC | PRN

## 2015-05-28 MED ORDER — OXYCODONE HCL ER 15 MG PO T12A
15.0000 mg | EXTENDED_RELEASE_TABLET | Freq: Two times a day (BID) | ORAL | Status: DC
  Administered 2015-05-28 – 2015-06-01 (×8): 15 mg via ORAL
  Filled 2015-05-28 (×8): qty 1

## 2015-05-28 MED ORDER — MAGNESIUM HYDROXIDE 400 MG/5ML PO SUSP
30.0000 mL | Freq: Every day | ORAL | Status: DC | PRN
  Administered 2015-05-30: 30 mL via ORAL
  Filled 2015-05-28: qty 30

## 2015-05-28 MED ORDER — FENTANYL CITRATE (PF) 100 MCG/2ML IJ SOLN
25.0000 ug | INTRAMUSCULAR | Status: DC | PRN
  Administered 2015-05-28 (×2): 25 ug via INTRAVENOUS

## 2015-05-28 MED ORDER — LIDOCAINE HCL (CARDIAC) 20 MG/ML IV SOLN
INTRAVENOUS | Status: DC | PRN
  Administered 2015-05-28: 60 mg via INTRAVENOUS

## 2015-05-28 MED ORDER — LEVOTHYROXINE SODIUM 88 MCG PO TABS
200.0000 ug | ORAL_TABLET | Freq: Every day | ORAL | Status: DC
Start: 2015-05-29 — End: 2015-06-01
  Administered 2015-05-30 – 2015-06-01 (×3): 200 ug via ORAL
  Filled 2015-05-28 (×3): qty 1

## 2015-05-28 MED ORDER — BISACODYL 10 MG RE SUPP
10.0000 mg | Freq: Every day | RECTAL | Status: DC | PRN
  Administered 2015-05-31: 10 mg via RECTAL
  Filled 2015-05-28: qty 1

## 2015-05-28 MED ORDER — PROPOFOL 500 MG/50ML IV EMUL
INTRAVENOUS | Status: DC | PRN
  Administered 2015-05-28: 50 ug/kg/min via INTRAVENOUS

## 2015-05-28 MED ORDER — FAMOTIDINE 20 MG PO TABS
20.0000 mg | ORAL_TABLET | Freq: Once | ORAL | Status: AC
  Administered 2015-05-28: 20 mg via ORAL

## 2015-05-28 MED ORDER — MIDAZOLAM HCL 5 MG/5ML IJ SOLN
INTRAMUSCULAR | Status: DC | PRN
  Administered 2015-05-28: 2 mg via INTRAVENOUS

## 2015-05-28 MED ORDER — LACTATED RINGERS IV SOLN: INTRAVENOUS | Status: DC

## 2015-05-28 MED ORDER — ACETAMINOPHEN 650 MG RE SUPP: 650.0000 mg | Freq: Four times a day (QID) | RECTAL | Status: DC | PRN

## 2015-05-28 MED ORDER — HYDROMORPHONE HCL 1 MG/ML IJ SOLN
1.0000 mg | Freq: Once | INTRAMUSCULAR | Status: AC
  Administered 2015-05-28: 1 mg via INTRAVENOUS
  Filled 2015-05-28: qty 1

## 2015-05-28 MED ORDER — ONDANSETRON HCL 4 MG/2ML IJ SOLN
INTRAMUSCULAR | Status: DC | PRN
  Administered 2015-05-28: 4 mg via INTRAVENOUS

## 2015-05-28 MED ORDER — ACETAMINOPHEN 325 MG PO TABS
650.0000 mg | ORAL_TABLET | Freq: Four times a day (QID) | ORAL | Status: DC | PRN
  Administered 2015-05-28: 650 mg via ORAL
  Filled 2015-05-28: qty 2

## 2015-05-28 MED ORDER — CLONIDINE HCL 0.1 MG PO TABS
0.3000 mg | ORAL_TABLET | Freq: Three times a day (TID) | ORAL | Status: DC
  Administered 2015-05-28 – 2015-06-01 (×12): 0.3 mg via ORAL
  Filled 2015-05-28 (×13): qty 3

## 2015-05-28 MED ORDER — NITROGLYCERIN 0.4 MG SL SUBL
SUBLINGUAL_TABLET | SUBLINGUAL | Status: AC
Start: 2015-05-28 — End: 2015-05-29
  Filled 2015-05-28: qty 1

## 2015-05-28 MED ORDER — SODIUM CHLORIDE 0.9 % IV SOLN
1000.0000 mg | INTRAVENOUS | Status: AC
  Administered 2015-05-28: 1000 mg via INTRAVENOUS
  Filled 2015-05-28: qty 10

## 2015-05-28 MED ORDER — OXYCODONE HCL 5 MG PO TABS
5.0000 mg | ORAL_TABLET | ORAL | Status: DC | PRN
  Administered 2015-05-28: 5 mg via ORAL
  Filled 2015-05-28: qty 1

## 2015-05-28 MED ORDER — ONDANSETRON HCL 4 MG PO TABS: 4.0000 mg | ORAL_TABLET | Freq: Four times a day (QID) | ORAL | Status: DC | PRN

## 2015-05-28 MED ORDER — BUPIVACAINE-EPINEPHRINE (PF) 0.25% -1:200000 IJ SOLN
INTRAMUSCULAR | Status: AC
  Filled 2015-05-28: qty 30

## 2015-05-28 MED ORDER — TRAMADOL HCL 50 MG PO TABS: 50.0000 mg | ORAL_TABLET | Freq: Four times a day (QID) | ORAL | Status: DC | PRN

## 2015-05-28 MED ORDER — ASPIRIN 325 MG PO TABS
325.0000 mg | ORAL_TABLET | Freq: Once | ORAL | Status: AC
Start: 2015-05-28 — End: 2015-05-28
  Administered 2015-05-28: 325 mg via ORAL

## 2015-05-28 MED ORDER — METOCLOPRAMIDE HCL 5 MG PO TABS: 5.0000 mg | ORAL_TABLET | Freq: Three times a day (TID) | ORAL | Status: DC | PRN

## 2015-05-28 MED ORDER — ONDANSETRON HCL 4 MG/2ML IJ SOLN: 4.0000 mg | Freq: Once | INTRAMUSCULAR | Status: DC | PRN

## 2015-05-28 MED ORDER — ENOXAPARIN SODIUM 40 MG/0.4ML ~~LOC~~ SOLN: 40.0000 mg | SUBCUTANEOUS | Status: DC

## 2015-05-28 MED ORDER — SODIUM CHLORIDE 0.9 % IV SOLN
INTRAVENOUS | Status: DC
  Administered 2015-05-28 – 2015-05-29 (×5): via INTRAVENOUS

## 2015-05-28 MED ORDER — ALUM & MAG HYDROXIDE-SIMETH 200-200-20 MG/5ML PO SUSP: 30.0000 mL | ORAL | Status: DC | PRN

## 2015-05-28 MED ORDER — MORPHINE SULFATE (PF) 2 MG/ML IV SOLN
2.0000 mg | Freq: Once | INTRAVENOUS | Status: AC
  Administered 2015-05-28: 2 mg via INTRAVENOUS

## 2015-05-28 MED ORDER — METFORMIN HCL 500 MG PO TABS
500.0000 mg | ORAL_TABLET | Freq: Two times a day (BID) | ORAL | Status: DC
  Filled 2015-05-28: qty 1

## 2015-05-28 MED ORDER — ONDANSETRON 4 MG PO TBDP
4.0000 mg | ORAL_TABLET | Freq: Three times a day (TID) | ORAL | Status: DC | PRN
  Filled 2015-05-28: qty 1

## 2015-05-28 MED ORDER — MAGNESIUM CITRATE PO SOLN: 1.0000 | Freq: Once | ORAL | Status: DC | PRN

## 2015-05-28 MED ORDER — ONDANSETRON HCL 4 MG/2ML IJ SOLN: 4.0000 mg | Freq: Four times a day (QID) | INTRAMUSCULAR | Status: DC | PRN

## 2015-05-28 MED ORDER — DOCUSATE SODIUM 100 MG PO CAPS
100.0000 mg | ORAL_CAPSULE | Freq: Two times a day (BID) | ORAL | Status: DC
  Administered 2015-05-29 – 2015-06-01 (×6): 100 mg via ORAL
  Filled 2015-05-28 (×7): qty 1

## 2015-05-28 MED ORDER — FENTANYL CITRATE (PF) 100 MCG/2ML IJ SOLN
INTRAMUSCULAR | Status: AC
  Administered 2015-05-28: 25 ug via INTRAVENOUS
  Filled 2015-05-28: qty 2

## 2015-05-28 MED ORDER — CEFAZOLIN SODIUM-DEXTROSE 2-3 GM-% IV SOLR
2.0000 g | Freq: Three times a day (TID) | INTRAVENOUS | Status: AC
  Administered 2015-05-28 – 2015-05-30 (×6): 2 g via INTRAVENOUS
  Filled 2015-05-28 (×7): qty 50

## 2015-05-28 MED ORDER — MENTHOL 3 MG MT LOZG: 1.0000 | LOZENGE | OROMUCOSAL | Status: DC | PRN

## 2015-05-28 MED ORDER — METOPROLOL SUCCINATE ER 25 MG PO TB24
25.0000 mg | ORAL_TABLET | ORAL | Status: DC
  Administered 2015-05-29 – 2015-06-01 (×5): 25 mg via ORAL
  Filled 2015-05-28 (×6): qty 1

## 2015-05-28 MED ORDER — METOCLOPRAMIDE HCL 5 MG/ML IJ SOLN: 5.0000 mg | Freq: Three times a day (TID) | INTRAMUSCULAR | Status: DC | PRN

## 2015-05-28 MED ORDER — IRBESARTAN 75 MG PO TABS
75.0000 mg | ORAL_TABLET | Freq: Every day | ORAL | Status: DC
  Administered 2015-05-28 – 2015-06-01 (×5): 75 mg via ORAL
  Filled 2015-05-28 (×5): qty 1

## 2015-05-28 MED ORDER — METHOCARBAMOL 1000 MG/10ML IJ SOLN: 500.0000 mg | Freq: Four times a day (QID) | INTRAVENOUS | Status: DC | PRN

## 2015-05-28 MED ORDER — NITROGLYCERIN 0.4 MG SL SUBL
0.4000 mg | SUBLINGUAL_TABLET | SUBLINGUAL | Status: DC | PRN
  Administered 2015-05-28 (×2): 0.4 mg via SUBLINGUAL

## 2015-05-28 MED ORDER — FUROSEMIDE 40 MG PO TABS
40.0000 mg | ORAL_TABLET | Freq: Every day | ORAL | Status: DC
  Administered 2015-05-30 – 2015-06-01 (×3): 40 mg via ORAL
  Filled 2015-05-28 (×5): qty 1

## 2015-05-28 MED ORDER — EPHEDRINE SULFATE 50 MG/ML IJ SOLN
INTRAMUSCULAR | Status: DC | PRN
  Administered 2015-05-28: 10 mg via INTRAVENOUS

## 2015-05-28 MED ORDER — INSULIN ASPART 100 UNIT/ML ~~LOC~~ SOLN
0.0000 [IU] | Freq: Three times a day (TID) | SUBCUTANEOUS | Status: DC
  Administered 2015-05-29 – 2015-05-30 (×4): 3 [IU] via SUBCUTANEOUS
  Administered 2015-05-30: 5 [IU] via SUBCUTANEOUS
  Administered 2015-05-30: 2 [IU] via SUBCUTANEOUS
  Administered 2015-05-31: 3 [IU] via SUBCUTANEOUS
  Administered 2015-05-31: 8 [IU] via SUBCUTANEOUS
  Administered 2015-05-31: 2 [IU] via SUBCUTANEOUS
  Administered 2015-06-01: 3 [IU] via SUBCUTANEOUS
  Administered 2015-06-01: 5 [IU] via SUBCUTANEOUS
  Filled 2015-05-28: qty 3
  Filled 2015-05-28: qty 2
  Filled 2015-05-28: qty 3
  Filled 2015-05-28: qty 2
  Filled 2015-05-28: qty 3
  Filled 2015-05-28: qty 2
  Filled 2015-05-28: qty 3
  Filled 2015-05-28 (×2): qty 5
  Filled 2015-05-28: qty 8
  Filled 2015-05-28: qty 3
  Filled 2015-05-28: qty 2

## 2015-05-28 MED ORDER — NEOMYCIN-POLYMYXIN B GU 40-200000 IR SOLN
Status: DC | PRN
  Administered 2015-05-28: 4 mL

## 2015-05-28 MED ORDER — CEFAZOLIN SODIUM-DEXTROSE 2-3 GM-% IV SOLR
INTRAVENOUS | Status: AC
Start: 2015-05-28 — End: 2015-05-28
  Filled 2015-05-28: qty 50

## 2015-05-28 MED ORDER — METHOCARBAMOL 500 MG PO TABS: 500.0000 mg | ORAL_TABLET | Freq: Four times a day (QID) | ORAL | Status: DC | PRN

## 2015-05-28 MED ORDER — TAMSULOSIN HCL 0.4 MG PO CAPS
0.4000 mg | ORAL_CAPSULE | Freq: Every day | ORAL | Status: DC
  Administered 2015-05-29 – 2015-06-01 (×4): 0.4 mg via ORAL
  Filled 2015-05-28 (×4): qty 1

## 2015-05-28 MED ORDER — BUPIVACAINE-EPINEPHRINE 0.25% -1:200000 IJ SOLN
INTRAMUSCULAR | Status: DC | PRN
  Administered 2015-05-28: 30 mL

## 2015-05-28 MED ORDER — NEOMYCIN-POLYMYXIN B GU 40-200000 IR SOLN
Status: AC
  Filled 2015-05-28: qty 2

## 2015-05-28 MED ORDER — FENTANYL CITRATE (PF) 100 MCG/2ML IJ SOLN
INTRAMUSCULAR | Status: DC | PRN
  Administered 2015-05-28: 25 ug via INTRAVENOUS

## 2015-05-28 MED ORDER — ASPIRIN EC 325 MG PO TBEC
DELAYED_RELEASE_TABLET | ORAL | Status: AC
  Filled 2015-05-28: qty 1

## 2015-05-28 MED ORDER — MORPHINE SULFATE (PF) 2 MG/ML IV SOLN
2.0000 mg | INTRAVENOUS | Status: DC | PRN
  Administered 2015-05-28: 2 mg via INTRAVENOUS
  Filled 2015-05-28: qty 1

## 2015-05-28 SURGICAL SUPPLY — 49 items
BLADE SAW 1/2 (BLADE) ×2 IMPLANT
BNDG COHESIVE 6X5 TAN STRL LF (GAUZE/BANDAGES/DRESSINGS) ×4 IMPLANT
BNDG GAUZE 4.5X4.1 6PLY STRL (MISCELLANEOUS) ×2 IMPLANT
CANISTER SUCT 1200ML W/VALVE (MISCELLANEOUS) ×2 IMPLANT
CAPT HIP TOTAL 3 ×2 IMPLANT
CAST PADDING 6X4YD ST 30248 (SOFTGOODS) ×4
CATH FOL LEG HOLDER (MISCELLANEOUS) ×2 IMPLANT
CATH TRAY METER 16FR LF (MISCELLANEOUS) ×2 IMPLANT
CHLORAPREP W/TINT 26ML (MISCELLANEOUS) ×2 IMPLANT
DRAPE C-ARM XRAY 36X54 (DRAPES) ×2 IMPLANT
DRAPE INCISE IOBAN 66X60 STRL (DRAPES) IMPLANT
DRAPE POUCH INSTRU U-SHP 10X18 (DRAPES) ×2 IMPLANT
DRAPE SHEET LG 3/4 BI-LAMINATE (DRAPES) ×6 IMPLANT
DRAPE TABLE BACK 80X90 (DRAPES) ×2 IMPLANT
ELECT BLADE 6.5 EXT (BLADE) ×2 IMPLANT
GAUZE SPONGE 4X4 12PLY STRL (GAUZE/BANDAGES/DRESSINGS) ×2 IMPLANT
GAUZE XEROFORM 4X4 STRL (GAUZE/BANDAGES/DRESSINGS) ×2 IMPLANT
GLOVE BIOGEL PI IND STRL 9 (GLOVE) ×1 IMPLANT
GLOVE BIOGEL PI INDICATOR 9 (GLOVE) ×1
GLOVE SURG ORTHO 9.0 STRL STRW (GLOVE) ×2 IMPLANT
GOWN SPECIALTY ULTRA XL (MISCELLANEOUS) ×2 IMPLANT
GOWN STRL REUS W/ TWL LRG LVL3 (GOWN DISPOSABLE) ×1 IMPLANT
GOWN STRL REUS W/TWL LRG LVL3 (GOWN DISPOSABLE) ×1
HEMOVAC 400CC 10FR (MISCELLANEOUS) ×2 IMPLANT
HOOD PEEL AWAY FACE SHEILD DIS (HOOD) ×2 IMPLANT
KIT PREVENA INCISION MGT 13 (CANNISTER) ×2 IMPLANT
MAT BLUE FLOOR 46X72 FLO (MISCELLANEOUS) ×2 IMPLANT
NDL SAFETY 18GX1.5 (NEEDLE) ×2 IMPLANT
NEEDLE SPNL 18GX3.5 QUINCKE PK (NEEDLE) ×2 IMPLANT
NS IRRIG 1000ML POUR BTL (IV SOLUTION) ×2 IMPLANT
PACK HIP COMPR (MISCELLANEOUS) ×2 IMPLANT
PADDING CAST COTTON 6X4 ST (SOFTGOODS) ×4 IMPLANT
REAMER ROD DEEP FLUTE 2.5X950 (INSTRUMENTS) ×2 IMPLANT
SOL PREP PVP 2OZ (MISCELLANEOUS) ×2
SOLUTION PREP PVP 2OZ (MISCELLANEOUS) ×1 IMPLANT
STAPLER SKIN PROX 35W (STAPLE) ×2 IMPLANT
STRAP SAFETY BODY (MISCELLANEOUS) ×2 IMPLANT
SUT DVC 2 QUILL PDO  T11 36X36 (SUTURE) ×1
SUT DVC 2 QUILL PDO T11 36X36 (SUTURE) ×1 IMPLANT
SUT DVC QUILL MONODERM 30X30 (SUTURE) ×2 IMPLANT
SUT ETHIBOND NAB CT1 #1 30IN (SUTURE) ×2 IMPLANT
SUT SILK 0 (SUTURE) ×1
SUT SILK 0 30XBRD TIE 6 (SUTURE) ×1 IMPLANT
SUT VIC AB 1 CT1 36 (SUTURE) ×2 IMPLANT
SYR 20CC LL (SYRINGE) ×2 IMPLANT
SYR 30ML LL (SYRINGE) ×2 IMPLANT
TAPE MICROFOAM 4IN (TAPE) ×2 IMPLANT
TUBE KAMVAC SUCTION (TUBING) ×2 IMPLANT
WATER STERILE IRR 1000ML POUR (IV SOLUTION) ×2 IMPLANT

## 2015-05-28 NOTE — Progress Notes (Signed)
Spoke with Dr.Menz regarding patient's severe right hip pain, no relief for any pain medications given so far ( see MAR), blood pressure 180/80, hr- 65, resp- 12. Order received for OxyContin 15 mg bid - first dose now.

## 2015-05-28 NOTE — Transfer of Care (Signed)
Immediate Anesthesia Transfer of Care Note  Patient: [name]  Procedure(s) Performed: Procedure(s): TOTAL HIP ARTHROPLASTY ANTERIOR APPROACH (Right)  Patient Location: PACU  Anesthesia Type:General and Spinal  Level of Consciousness: awake, alert , oriented and patient cooperative  Airway & Oxygen Therapy: Patient Spontanous Breathing and Patient connected to face mask oxygen  Post-op Assessment: Report given to RN, Post -op Vital signs reviewed and stable and Patient moving all extremities X 4  Post vital signs: Reviewed and stable  Last Vitals:  Filed Vitals:   05/28/15 0838  BP: 192/64  Pulse: 68  Temp: 36.7 C  Resp: 18    Complications: No apparent anesthesia complications

## 2015-05-28 NOTE — H&P (Signed)
Reviewed paper H+P, will be scanned into chart. No changes noted.  

## 2015-05-28 NOTE — OR Nursing (Addendum)
Upon arrival to [facility] via w/c, pt noted to have compression wraps bilaterally to lower legs do to open oozing blisters being treated by wound center.  Pt states she was seen at wound center yesterday by Dr burns and dressing rewrapped.  Pt states Dr burns called Dr Rudene Christians yesterday to discuss the wounds and the plan of care with surgery.  Pt also has a red rash under panis on right hip surgical site and very red in the crease of hip.  states she used the chg wash at home. Unable to use the sage wipes to surgical site due to redness and irritation.  MRSA pcr nasal swab obtained and sent to lab.  Notes obtained from wound center and placed in chart.

## 2015-05-28 NOTE — Consult Note (Signed)
[place]  CARDIOLOGY CONSULT NOTE  Patient ID: ANGLA ZEGARELLI MRN: [id number] DOB/AGE: [birth year] 61 y.o.  Admit date: 05/28/2015 Referring Physician Dr. Rudene Christians Primary Physician Dr. Ronnald Ramp Primary Cardiologist Dr. Clayborn Bigness Reason for Consultation Chest pain post op  HPI: Patient is a 58 female with no prior cardiac history who we are seeing today after patient developed chest pain after hip surgery.  Patient has a history of diabetes, hypertension.  She was evaluated preoperatively with an echocardiogram which LV function with mild LVH.  There was no significant valvular abnormality she had a Lexiscan stress test which revealed no stress-induced ischemia.  Her electrocardiogram preoperatively revealed sinus bradycardia with nonspecific ST T wave changes.  Postoperative EKG showed similar findings.  She is currently pain free after sublingual nitroglycerin x2 as well as aspirin and morphine.  She denies any prior cardiac history but has been limited in her activity due to her hip pain.  Risk factors include diabetes and hypertension.  She is on metoprolol, clonidine and Micardis as well as enteric-coated aspirin as an outpatient.  ROS Review of Systems - History obtained from chart review and the patient General ROS: positive for  - Chest pain and surgical hip pain Respiratory ROS: no cough, shortness of breath, or wheezing Cardiovascular ROS: positive for - Transient chest pain currently without pain Gastrointestinal ROS: no abdominal pain, change in bowel habits, or black or bloody stools Musculoskeletal ROS: negative Neurological ROS: no TIA or stroke symptoms   Past Medical History  Diagnosis Date  . Hypertension   . Hypothyroidism   . Diabetes mellitus without complication ([place]) [id number]  . Kidney stone 11/2014    history of/STAGE 4 KIDNEY DISEASE PER DR Juleen China  . Lymphedema   . Multiple open wounds of lower leg     PT BEING SEEN BY THE WOUND  CENTER FOR CHRONIC BLISTERS PER PT    Family History  Problem Relation Age of Onset  . Cancer Mother   . Diabetes Father     Social History   Social History  . Marital Status: Single    Spouse Name: N/A  . Number of Children: N/A  . Years of Education: N/A   Occupational History  . Not on file.   Social History Main Topics  . Smoking status: Never Smoker   . Smokeless tobacco: Not on file  . Alcohol Use: No  . Drug Use: No  . Sexual Activity: No   Other Topics Concern  . Not on file   Social History Narrative    History reviewed. No pertinent past surgical history.   Prescriptions prior to admission  Medication Sig Dispense Refill Last Dose  . aspirin EC 81 MG tablet Take 81 mg by mouth daily.   05/20/2015  . cloNIDine (CATAPRES) 0.3 MG tablet Take 1 tablet by mouth 3  times daily 270 tablet 1 05/28/2015  . furosemide (LASIX) 40 MG tablet Take 1 tablet by mouth  daily (Patient taking differently: Take 1 tablet by mouth  daily in am-PRN) 90 tablet 0 05/24/2015  . levothyroxine (SYNTHROID, LEVOTHROID) 200 MCG tablet Take 1 tablet by mouth  daily 90 tablet 1 05/28/2015  . metoprolol succinate (TOPROL-XL) 50 MG 24 hr tablet Take 1 tablet by mouth  daily (Patient taking differently: Take 25 mg by mouth every morning. Take 1 tablet by mouth  daily) 90 tablet 1 05/28/2015 at Unknown time  . oxyCODONE-acetaminophen (ROXICET) 5-325 MG tablet Take 1 tablet by mouth  every 4 (four) hours as needed for severe pain. 20 tablet 0 05/27/2015  . telmisartan (MICARDIS) 80 MG tablet Take 1 tablet by mouth  daily (Patient taking differently: Take 160 mg by mouth every morning. Take 1 tablet by mouth  daily) 90 tablet 1 05/28/2015 at Unknown time  . traMADol (ULTRAM) 50 MG tablet Take 50 mg by mouth every 6 (six) hours as needed for moderate pain.   05/24/2015  . etodolac (LODINE) 400 MG tablet Take 1 tablet by mouth two  times daily (Patient not taking: Reported on 05/07/2015) 180 tablet 0 Not  Taking at Unknown time  . metFORMIN (GLUCOPHAGE) 500 MG tablet Take 1 tablet by mouth two  times daily (Patient not taking: Reported on 05/28/2015) 180 tablet 0 Not Taking at Unknown time  . ondansetron (ZOFRAN ODT) 4 MG disintegrating tablet Take 1 tablet (4 mg total) by mouth every 8 (eight) hours as needed for nausea or vomiting. (Patient not taking: Reported on 05/28/2015) 20 tablet 0 Not Taking at Unknown time  . tamsulosin (FLOMAX) 0.4 MG CAPS capsule Take 1 capsule (0.4 mg total) by mouth daily after breakfast. (Patient not taking: Reported on 05/28/2015) 5 capsule 0 Not Taking at Unknown time    Physical Exam: Blood pressure 155/59, pulse 71, temperature 97.9 F (36.6 C), temperature source Oral, resp. rate 18, height 5\' 5"  (1.651 m), weight 113.399 kg (250 lb), SpO2 100 %.   General appearance: alert and cooperative Resp: clear to auscultation bilaterally Chest wall: no tenderness Cardio: regular rate and rhythm GI: soft, non-tender; bowel sounds normal; no masses,  no organomegaly Extremities: extremities normal, atraumatic, no cyanosis or edema Pulses: 2+ and symmetric Neurologic: Grossly normal Labs:   Lab Results  Component Value Date   WBC 6.8 05/28/2015   HGB 11.6* 05/28/2015   HCT 35.5 05/28/2015   MCV 87.6 05/28/2015   PLT 272 05/28/2015    Recent Labs Lab 05/28/15 0833  NA 134*  K 4.0  CL 101  CO2 25  BUN 15  CREATININE 1.62*  CALCIUM 9.4  GLUCOSE 191*   Lab Results  Component Value Date   TROPONINI <0.03 05/07/2015       EKG:  Sinus rhythm with no ischemia  ASSESSMENT AND PLAN:    Patient with history of hip pain status post surgery now with chest pain post hip replacement.  Currently stable with no significant EKG changes.   Likely nonischemic chest pain.  Will follow on off unit to telemetry overnight on her metoprolol and clonidine and aspirin as tolerated from an orthopedic standpoint and review EKG in the morning with further recommendations based  on results of this. Signed: Teodoro Spray MD, [facility] 05/28/2015, 3:29 PM

## 2015-05-28 NOTE — Op Note (Signed)
05/28/2015  1:22 PM  PATIENT:  [name]  59 y.o. female  PRE-OPERATIVE DIAGNOSIS:  OSTEOARTHRITIS  POST-OPERATIVE DIAGNOSIS:  osteoarthritis right hip  PROCEDURE:  Procedure(s): TOTAL HIP ARTHROPLASTY ANTERIOR APPROACH (Right)  SURGEON: Laurene Footman, MD  ASSISTANTS: None  ANESTHESIA:   spinal  EBL:  Total I/O In: 1300 [I.V.:1300] Out: 950 [Urine:550; Blood:400]  BLOOD ADMINISTERED:none  DRAINS: (Medium) Hemovact drain(s) in the Subcutaneous layer with  Suction Open   LOCAL MEDICATIONS USED:  MARCAINE     SPECIMEN:  Source of Specimen:  Femoral head right  DISPOSITION OF SPECIMEN:  PATHOLOGY  COUNTS:  YES  TOURNIQUET:  * No tourniquets in log *  IMPLANTS: Medacta AMIS 3 STEM with 54 mm Mpact cup DM with liner and S 28 mm head  DICTATION: .Dragon Dictation   The patient was brought to the operating room and after spinal anesthesia was obtained patient was placed on the operative table with the ipsilateral foot into the Medacta attachment, contralateral leg on a well-padded table dressings were applied to her venous stasis areas in the lower legs. C-arm was brought in and preop template x-ray taken. After prepping and draping in usual sterile fashion appropriate patient identification and timeout procedures were completed. Anterior approach to the hip with bikini approach was obtained and centered over the greater trochanter and TFL muscle. The subcutaneous tissue was incised hemostasis being achieved by electrocautery. TFL fascia was incised and the muscle retracted laterally deep retractor placed. The lateral femoral circumflex vessels were identified and ligated. The anterior capsule was exposed and a capsulotomy performed. The neck was identified and a femoral neck cut carried out with a saw. The head was removed without difficulty and showed sclerotic femoral head and acetabulum. Reaming was carried out to 52 mm and a 54 mm cup trial gave appropriate tightness to  the acetabular component a 55 Mpact DM cup was impacted into position. The leg was then externally rotated and ischiofemoral and pupo releases carried out. The femur was sequentially broached to a size 3 with reaming required to get the stem seated, size 3 stem and S head trials were placed and the final components chosen. The 3  stem was inserted along with a S 28 mm head and 54 mm liner. The hip was reduced and was stable the wound was thoroughly irrigated with a dilute Betadine solution. The deep fascia was closed Using a heavy Quill after infiltration of 30 cc of quarter percent Sensorcaine with epinephrine. Subcutaneous drains were then inserted 2-0 Quill to close the skin with skin staples  Proviena dressing applied  PLAN OF CARE: Admit to inpatient

## 2015-05-28 NOTE — OR Nursing (Signed)
Wounds on both lower extremeties were draining green ooze, skin removed when telfa pad pulled up.  Skin folds over edematous regions from wraps previously applied.  Patient denies discomfort on lower legs.  Unable to locate pulses behind right knee, right ankle(inside and outside regions) or on top of edematous foot.  Redness noted from swelling.  Patient lives alone and does not sleep in a bed, but in a chair, according to conversations.  Could be some concern for having legs elevated for lymphadema treatment and post hip replacement. Patient plans on going to rehab briefly and will return home.  Setting is not desirable for recovery but patient refuses any medical help or friends to enter her home.  Will contact Social Services to follow up with patient and possibly do a home visit prior to sending patient home.

## 2015-05-28 NOTE — Progress Notes (Signed)
S/P right total hip surgery, pt's alert and oriented , v/s assessed, placed on tele monitor, iv site intact, bilateral lower extremity with dressing in place- patient go to wound clinic for care, BLE dressing changed at the wound clinic on 05/27/15 per patient. Right hip dressing dry/clean/intact with Hemovac and provena . AVI in place BLE. Foley in place draining clear yellow urine.

## 2015-05-28 NOTE — Anesthesia Procedure Notes (Signed)
Spinal Patient location during procedure: OR Start time: 05/28/2015 10:20 AM End time: 05/28/2015 10:38 AM Staffing Performed by: anesthesiologist  Preanesthetic Checklist Completed: patient identified, site marked, surgical consent, pre-op evaluation, timeout performed, IV checked, risks and benefits discussed and monitors and equipment checked Spinal Block Patient position: sitting Prep: Betadine Patient monitoring: heart rate, continuous pulse ox, blood pressure and cardiac monitor Approach: midline Location: L4-5 Injection technique: single-shot Needle Needle type: Whitacre and Introducer  Needle gauge: 24 G Needle length: 9 cm Needle insertion depth: 7 cm Assessment Sensory level: T8 Additional Notes Negative paresthesia. Negative blood return. Positive free-flowing CSF. Expiration date of kit checked and confirmed. Patient tolerated procedure well, without complications.

## 2015-05-28 NOTE — OR Nursing (Signed)
Dr Rudene Christians in to see pt.  Wounds unwrapped and examined.  Multiple wounds on each lower extremity, oozing and red.  With 2-3+ edema.  Unable to locate pedal pulses on either foot, possibly due to edema.  Also examined redness and rash  On right hip area under panis. plans proceed with surgery.

## 2015-05-28 NOTE — Anesthesia Preprocedure Evaluation (Signed)
Anesthesia Evaluation  Patient identified by MRN, date of birth, ID band Patient awake    Reviewed: Allergy & Precautions, NPO status , Patient's Chart, lab work & pertinent test results, reviewed documented beta blocker date and time   History of Anesthesia Complications Negative for: history of anesthetic complications  Airway Mallampati: III       Dental  (+) Teeth Intact   Pulmonary neg pulmonary ROS,           Cardiovascular hypertension, Pt. on medications and Pt. on home beta blockers      Neuro/Psych negative neurological ROS     GI/Hepatic negative GI ROS, Neg liver ROS,   Endo/Other  diabetes, Type 2, Oral Hypoglycemic AgentsHypothyroidism   Renal/GU Renal InsufficiencyRenal disease     Musculoskeletal   Abdominal   Peds  Hematology   Anesthesia Other Findings   Reproductive/Obstetrics                             Anesthesia Physical Anesthesia Plan  ASA: III  Anesthesia Plan: Spinal   Post-op Pain Management:    Induction:   Airway Management Planned:   Additional Equipment:   Intra-op Plan:   Post-operative Plan:   Informed Consent: I have reviewed the patients History and Physical, chart, labs and discussed the procedure including the risks, benefits and alternatives for the proposed anesthesia with the patient or authorized representative who has indicated his/her understanding and acceptance.     Plan Discussed with:   Anesthesia Plan Comments:         Anesthesia Quick Evaluation

## 2015-05-28 NOTE — Progress Notes (Signed)
Per  Dr Rudene Christians on rounds. Leave dressing on ble and have wound care center chg dressings tomorrow.

## 2015-05-29 LAB — GLUCOSE, CAPILLARY
GLUCOSE-CAPILLARY: 182 mg/dL — AB (ref 65–99)
GLUCOSE-CAPILLARY: 195 mg/dL — AB (ref 65–99)
Glucose-Capillary: 194 mg/dL — ABNORMAL HIGH (ref 65–99)
Glucose-Capillary: 155 mg/dL — ABNORMAL HIGH (ref 65–99)

## 2015-05-29 MED ORDER — ENOXAPARIN SODIUM 40 MG/0.4ML ~~LOC~~ SOLN
40.0000 mg | Freq: Two times a day (BID) | SUBCUTANEOUS | Status: DC
  Administered 2015-05-29 – 2015-06-01 (×7): 40 mg via SUBCUTANEOUS
  Filled 2015-05-29 (×7): qty 0.4

## 2015-05-29 NOTE — Care Management Note (Signed)
Case Management Note  Patient Details  Name: [name] MRN: [id number] Date of Birth: [birth year]  Subjective/Objective:                  Met with patient to discuss discharge planning. PT is still pending. Patient wants to go to [facility] for rehab. She states she had been using a cane inside her home and then a walker when she goes out. She states her neighbor was helping her out but recently had an MI and is unable to help her. Patient states her friend [name] brought her to her surgery appointment lives about 25 minutes from patient's [place]. She states she uses [facility]. Patient has ulcers on her lower extremities per RN requiring WOC. She has [employer] which will require prior authorization for SNF and if she need home health PT the [facility] will need to arrange through [facility] [phone] Fax [phone].  Action/Plan: [facility] number delivered to patient. RNCM will continue to follow.   Expected Discharge Date:  05/28/15               Expected Discharge Plan:     In-House Referral:     Discharge planning Services  CM Consult  Post Acute Care Choice:  Home Health Choice offered to:  Patient  DME Arranged:    DME Agency:     HH Arranged:    [name]:     Status of Service:  In process, will continue to follow  Medicare Important Message Given:    Date Medicare IM Given:    Medicare IM give by:    Date Additional Medicare IM Given:    Additional Medicare Important Message give by:     If discussed at [place] of Stay Meetings, dates discussed:    Additional Comments:  Marshell Garfinkel, RN 05/29/2015, 9:43 AM

## 2015-05-29 NOTE — Evaluation (Signed)
Occupational Therapy Evaluation Patient Details Name: [name] MRN: [id number] DOB: Oct 30, 1955 Today's Date: 05/29/2015    History of Present Illness Pt here with L hip replacement, anterior approach   Clinical Impression   This patient is a 59 year old female who came to [facility] for a L total hip replacement.  Patient lives in a 1 story home with 1 step to enter.   She had been independent with ADL and functional mobility. She now requires assistance and would benefit from Occupational Therapy for ADL/functioal mobility training.      Follow Up Recommendations  SNF    Equipment Recommendations       Recommendations for Other Services       Precautions / Restrictions Precautions Precautions: Anterior Hip Restrictions Weight Bearing Restrictions: Yes LLE Weight Bearing: Weight bearing as tolerated      Mobility Bed Mobility  Transfers    Balance                                            ADL                                         General ADL Comments: Had been independent with BADL. Had been working until recently and would like to return.  Practiced techniques for lower body dressing using hip kit with hand over hand assist to illustrate. Needed cues for technique.     Vision     Perception     Praxis      Pertinent Vitals/Pain Pain Assessment: 0-10 Pain Score: 4      Hand Dominance     Extremity/Trunk Assessment Upper Extremity Assessment Upper Extremity Assessment:  (hip kit)         Communication Communication Communication: No difficulties   Cognition Arousal/Alertness: Awake/alert Behavior During Therapy: WFL for tasks assessed/performed Overall Cognitive Status: Within Functional Limits for tasks assessed                     General Comments       Exercises     Shoulder Instructions      Home Living Family/patient expects to be discharged to:: Skilled  nursing facility Living Arrangements: Alone                               Additional Comments: Pt reports that she had been driving and generally more independent just 6 months ago      Prior Functioning/Environment Level of Independence: Independent with assistive device(s)        Comments: Pt has had increasing falls recently    OT Diagnosis: Acute pain   OT Problem List:     OT Treatment/Interventions: Self-care/ADL training    OT Goals(Current goals can be found in the care plan section) Acute Rehab OT Goals Patient Stated Goal: "I know I need to get stronger" OT Goal Formulation: With patient Time For Goal Achievement: 06/12/15 Potential to Achieve Goals: Good  OT Frequency: Min 1X/week   Barriers to D/C:            Co-evaluation              End of Session Equipment Utilized  During Treatment:  (Hip kit)  Activity Tolerance:   Patient left: in chair;with call bell/phone within reach;with chair alarm set   Time: 1000-1025 OT Time Calculation (min): 25 min Charges:  OT General Charges $OT Visit: 1 Procedure OT Evaluation $Initial OT Evaluation Tier I: 1 Procedure OT Treatments $Self Care/Home Management : 8-22 mins G-Codes:    Myrene Galas, MS/OTR/L  05/29/2015, 11:56 AM

## 2015-05-29 NOTE — Progress Notes (Signed)
Pt refused to take furosemide this AM. Education provided, pt still refused stating it makes her go to the bathroom too much. Explained/educated on med again, pt still refused, states "maybe tomorrow I'll take it".

## 2015-05-29 NOTE — Consult Note (Signed)
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  Seen at [facility].  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  Apply Xeroform gauze to wound beds for antimicrobial protection and nonadherence to wound bed.  Top with calcium alginate for absorption.  Wrap leg with kerlix from knee to ankle.  Apply JuxtaFit compression garment. Bilateral lower legs.  Change three times weekly.  Mon/Wed/Fri. Will not follow at this time.  Please re-consult if needed.  Domenic Moras RN BSN [place] Pager [phone]

## 2015-05-29 NOTE — Progress Notes (Signed)
  Subjective: 1 Day Post-Op Procedure(s) (LRB): TOTAL HIP ARTHROPLASTY ANTERIOR APPROACH (Right) Patient reports pain as moderate.   Patient seen in rounds with Dr. Rudene Christians. Patient is well, and has had no acute complaints or problems Plan is to go Rehab after hospital stay. Negative for chest pain and shortness of breath Fever: no Gastrointestinal: Negative for nausea and vomiting  Objective: Vital signs in last 24 hours: Temp:  [96.9 F (36.1 C)-102.1 F (38.9 C)] 97.5 F (36.4 C) (12/02 0415) Pulse Rate:  [58-83] 59 (12/02 0415) Resp:  [12-18] 18 (12/02 0415) BP: (119-220)/(39-88) 158/65 mmHg (12/02 0415) SpO2:  [97 %-100 %] 98 % (12/02 0415) Weight:  [113.399 kg (250 lb)] 113.399 kg (250 lb) (12/01 0838)  Intake/Output from previous day:  Intake/Output Summary (Last 24 hours) at 05/29/15 0601 Last data filed at 05/29/15 0418  Gross per 24 hour  Intake   1825 ml  Output   1780 ml  Net     45 ml    Intake/Output this shift: Total I/O In: 151.7 [I.V.:151.7] Out: 275 [Urine:250; Drains:25]  Labs:  Recent Labs  05/28/15 0833  HGB 11.6*    Recent Labs  05/28/15 0833  WBC 6.8  RBC 4.05  HCT 35.5  PLT 272    Recent Labs  05/28/15 0833  NA 134*  K 4.0  CL 101  CO2 25  BUN 15  CREATININE 1.62*  GLUCOSE 191*  CALCIUM 9.4    Recent Labs  05/28/15 0833  INR 1.22     EXAM General - Patient is Alert and Oriented Extremity - Sensation intact distally Dorsiflexion/Plantar flexion intact Dressing/Incision - clean, dry, no drainage from the dressing. The Hemovac is in place. Motor Function - intact, moving foot and toes well on exam.   Past Medical History  Diagnosis Date  . Hypertension   . Hypothyroidism   . Diabetes mellitus without complication ([place]) [id number]  . Kidney stone 11/2014    history of/STAGE 4 KIDNEY DISEASE PER DR Juleen China  . Lymphedema   . Multiple open wounds of lower leg     PT BEING SEEN BY THE WOUND CENTER FOR CHRONIC BLISTERS PER  PT    Assessment/Plan: 1 Day Post-Op Procedure(s) (LRB): TOTAL HIP ARTHROPLASTY ANTERIOR APPROACH (Right) Active Problems:   Primary osteoarthritis of right hip  Estimated body mass index is 41.6 kg/(m^2) as calculated from the following:   Height as of this encounter: 5\' 5"  (1.651 m).   Weight as of this encounter: 113.399 kg (250 lb). Advance diet Up with therapy  DVT Prophylaxis - Lovenox, Foot Pumps and TED hose Weight-Bearing as tolerated to right leg  Reche Dixon, PA-C Orthopaedic Surgery 05/29/2015, 6:01 AM

## 2015-05-29 NOTE — Clinical Documentation Improvement (Signed)
Orthopedic  Patient with BMI of 41.6  Please provide a diagnosis associated with an elevated BMI and document findings in next progress note. Do not document in BPA drop down box.   Obesity with BMI of 41.6  Morbid Obesity with BMI of 41.6  Other  Clinically Undetermined  Please exercise your independent, professional judgment when responding. A specific answer is not anticipated or expected.  Thank You, Zoila Shutter RN, [place] [phone]; Cell: [phone]

## 2015-05-29 NOTE — Anesthesia Postprocedure Evaluation (Signed)
Anesthesia Post Note  Patient: [name]  Procedure(s) Performed: Procedure(s) (LRB): TOTAL HIP ARTHROPLASTY ANTERIOR APPROACH (Right)  Anesthesia Type: Spinal Level of consciousness: awake and alert Pain management: satisfactory to patient Vital Signs Assessment: post-procedure vital signs reviewed and stable Respiratory status: spontaneous breathing Cardiovascular status: stable Postop Assessment: no headache and no backache Anesthetic complications: no    Last Vitals:  Filed Vitals:   05/29/15 0800 05/29/15 1130  BP: 142/64 118/50  Pulse: 57 58  Temp: 36.5 C 36.9 C  Resp: 18 18    Last Pain:  Filed Vitals:   05/29/15 1249  PainSc: 2     LLE Motor Response: Purposeful movement LLE Sensation: Full sensation RLE Motor Response: Purposeful movement RLE Sensation: Full sensation      Rolla Plate P

## 2015-05-29 NOTE — Progress Notes (Signed)
Enoxaparin   Patient qualifies for Enoxaparin 40 mg BID based on CrCl >30 ml/min and BMI >40 per policy. Will change to Enoxaparin 40 mg BID.  Verleen Stuckey D. Aadarsh Cozort, PharmD    

## 2015-05-29 NOTE — Progress Notes (Addendum)
Pt participated in PT x 2 today. Good appetite. Pain well controlled with Nucynta, tylenol. Plan to d/c to [facility], likely Monday 06/01/15. Baxter Flattery SW informed pt.  SSI coverage given all 3 meals today.

## 2015-05-29 NOTE — Progress Notes (Signed)
Physical Therapy Treatment Patient Details Name: [name] MRN: [id number] DOB: 03/08/1956 Today's Date: 05/29/2015    History of Present Illness Pt here with L hip replacement, anterior approach    PT Comments    Pt was able to rise from chair with only min assist and was motivated to try to walk to the door, but continued with very slow, labored ambulation with forward flexed posture and R foot drop needing extra effort to clear toes.  She does well with R LE exercises but has expected limitations with pain, weakness and fatigue.    Follow Up Recommendations  SNF     Equipment Recommendations       Recommendations for Other Services       Precautions / Restrictions Precautions Precautions: Anterior Hip Restrictions LLE Weight Bearing: Weight bearing as tolerated    Mobility  Bed Mobility Overal bed mobility: Needs Assistance Bed Mobility: Sit to Supine       Sit to supine: Max assist;Mod assist   General bed mobility comments: Pt needs considerable assist to get R LE back up into bed, she is able to scoot in bed fairly well and   Transfers Overall transfer level: Needs assistance Equipment used: Rolling walker (2 wheeled) Transfers: Sit to/from Stand Sit to Stand: Min assist         General transfer comment: Pt needing less assist to get to standing this afternoon, though she continues to be very reliant on the walker and has forward flexed posture  Ambulation/Gait Ambulation/Gait assistance: Min assist Ambulation Distance (Feet): 8 Feet Assistive device: Rolling walker (2 wheeled)       General Gait Details: Again pt limited with slow, labored ambulation but does not have losses of balance.  She fatigues quickly with the effort and despite wanting to try to make it to the door she is just not able to push herself.   Stairs            Wheelchair Mobility    Modified Rankin (Stroke Patients Only)       Balance                                    Cognition Arousal/Alertness: Awake/alert Behavior During Therapy: WFL for tasks assessed/performed Overall Cognitive Status: Within Functional Limits for tasks assessed                      Exercises Total Joint Exercises Ankle Circles/Pumps: AROM;Right;PROM;Left Quad Sets: Strengthening;Right;10 reps Gluteal Sets: 10 reps;Strengthening Heel Slides: AAROM;10 reps Hip ABduction/ADduction: AROM;10 reps    General Comments        Pertinent Vitals/Pain Pain Assessment: 0-10 Pain Score: 6     Home Living Family/patient expects to be discharged to:: Skilled nursing facility Living Arrangements: Alone                  Prior Function Level of Independence: Independent with assistive device(s)          PT Goals (current goals can now be found in the care plan section) Acute Rehab PT Goals Patient Stated Goal: "I know I need to get stronger" Progress towards PT goals: Progressing toward goals    Frequency  BID    PT Plan Current plan remains appropriate    Co-evaluation             End of Session Equipment Utilized During Treatment: Gait  belt Activity Tolerance: No increased pain;Patient limited by fatigue;Patient limited by pain       Time: 1352-1420 PT Time Calculation (min) (ACUTE ONLY): 28 min  Charges:  $Gait Training: 8-22 mins $Therapeutic Exercise: 8-22 mins                    G Codes:     Wayne Both, PT, DPT [phone]  Kreg Shropshire 05/29/2015, 3:14 PM

## 2015-05-29 NOTE — Evaluation (Signed)
Physical Therapy Evaluation Patient Details Name: [name] MRN: [id number] DOB: 04-02-1956 Today's Date: 05/29/2015   History of Present Illness  Pt here with L hip replacement, anterior approach  Clinical Impression  Pt is able to participate with ~10 minutes of exercises apart from exam but does need considerable encouragement and cuing. She is fearful and hesitant but ultimately is able to do some limited mobility and ambulation with assist.  Pt with L foot drop making ambulation more difficult but with deliberate effort she is able to take decent steps though she has poor posture during ambulation.     Follow Up Recommendations SNF    Equipment Recommendations       Recommendations for Other Services       Precautions / Restrictions Precautions Precautions: Anterior Hip Restrictions Weight Bearing Restrictions: Yes LLE Weight Bearing: Weight bearing as tolerated      Mobility  Bed Mobility Overal bed mobility: Needs Assistance Bed Mobility: Supine to Sit     Supine to sit: Mod assist;Max assist     General bed mobility comments: Pt needs assist to get to EOB, c/o pain and is hesitant the entire time.   Transfers Overall transfer level: Needs assistance Equipment used: Rolling walker (2 wheeled) Transfers: Sit to/from Stand Sit to Stand: Mod assist         General transfer comment: Pt is able to get to standing but remains forward flexed posture and is unable to maintain upright posture w/o constant cuing.  She is reliant on the walker for support  Ambulation/Gait Ambulation/Gait assistance: Min/Mod assist Ambulation Distance (Feet): 6 Feet Assistive device: Rolling walker (2 wheeled)       General Gait Details: Pt is very slow and labored with ambulation.  She is hesitant and fearful, but does not have any losses of balance.  After turning to get to the chair she was able to take a few small forward step with chair following but becomes fatigued and  is hesitant the entire time.   Stairs            Wheelchair Mobility    Modified Rankin (Stroke Patients Only)       Balance                                             Pertinent Vitals/Pain Pain Assessment: 0-10 Pain Score: 4     Home Living Family/patient expects to be discharged to:: Skilled nursing facility Living Arrangements: Alone               Additional Comments: Pt reports that she had been driving and generally more independent just 6 months ago    Prior Function Level of Independence: Independent with assistive device(s)         Comments: Pt has had increasing falls recently     Hand Dominance        Extremity/Trunk Assessment   Upper Extremity Assessment: Generalized weakness (pt hesitant with any movement does have functional AROM)           Lower Extremity Assessment: LLE deficits/detail;RLE deficits/detail RLE Deficits / Details: Pt with pain limited AROM in R and generally displays 3- to 3+/5 strength LLE Deficits / Details: Pt with signficant foot drop on L, generalized weakness t/o grossly 3+/5     Communication   Communication: No difficulties  Cognition Arousal/Alertness: Awake/alert Behavior  During Therapy: Anxious Overall Cognitive Status: Within Functional Limits for tasks assessed                      General Comments      Exercises Total Joint Exercises Ankle Circles/Pumps: AROM;Right;PROM;Left Quad Sets: Strengthening Gluteal Sets: 10 reps;Strengthening Heel Slides: AAROM;10 reps Hip ABduction/ADduction: AAROM;10 reps      Assessment/Plan    PT Assessment Patient needs continued PT services  PT Diagnosis Difficulty walking;Generalized weakness   PT Problem List Decreased safety awareness;Decreased strength;Decreased activity tolerance;Decreased balance;Decreased mobility;Decreased coordination;Decreased range of motion;Decreased knowledge of use of DME;Decreased  cognition;Decreased knowledge of precautions  PT Treatment Interventions DME instruction;Gait training;Stair training;Functional mobility training;Therapeutic activities;Balance training;Therapeutic exercise;Neuromuscular re-education;Patient/family education   PT Goals (Current goals can be found in the Care Plan section) Acute Rehab PT Goals Patient Stated Goal: "I know I need to get stronger" PT Goal Formulation: With patient Time For Goal Achievement: 06/12/15 Potential to Achieve Goals: Fair    Frequency BID   Barriers to discharge        Co-evaluation               End of Session Equipment Utilized During Treatment: Gait belt Activity Tolerance: No increased pain;Patient limited by fatigue;Patient limited by pain Patient left: with chair alarm set           Time: [phone] PT Time Calculation (min) (ACUTE ONLY): 30 min   Charges:   PT Evaluation $Initial PT Evaluation Tier I: 1 Procedure PT Treatments $Therapeutic Exercise: 8-22 mins   PT G Codes:       Wayne Both, PT, DPT [phone]  Kreg Shropshire 05/29/2015, 10:50 AM

## 2015-05-29 NOTE — Progress Notes (Signed)
Initial Nutrition Assessment   INTERVENTION:   Meals and Snacks: Cater to patient preferences Medical Food Supplement Therapy: will recommend Sugar Free Mighty Shakes on meal trays TID for added nutrition (each shake provides 300kcals and 9g protein) Coordination of Care: will recommend new weight   NUTRITION DIAGNOSIS:   Increased nutrient needs related to wound healing as evidenced by estimated needs.  GOAL:   Patient will meet greater than or equal to 90% of their needs  MONITOR:    (Energy Intake, Anthropometrics, Glucose Profile)  REASON FOR ASSESSMENT:    (Pressure Ulcer )    ASSESSMENT:   Pt admitted with osteoarthritis of right hip, s/p total hip arthroplasty 12/1.  Past Medical History  Diagnosis Date  . Hypertension   . Hypothyroidism   . Diabetes mellitus without complication ([place]) [id number]  . Kidney stone 11/2014    history of/STAGE 4 KIDNEY DISEASE PER DR Juleen China  . Lymphedema   . Multiple open wounds of lower leg     PT BEING SEEN BY THE [facility]OUND CENTER FOR CHRONIC BLISTERS PER PT     Diet Order:  Diet Carb Modified Fluid consistency:: Thin; Room service appropriate?: Yes    Current Nutrition: Pt reports eating 100% of breakfast this am.   Food/Nutrition-Related History: Pt reports good appetite at home PTA eating 2-3 meals. Pt reports not wanting to 'cook' any more but has salads or a burger for dinner.    Scheduled Medications:  . acetaminophen  1,000 mg Oral 4 times per day  . aspirin EC  81 mg Oral Daily  .  ceFAZolin (ANCEF) IV  2 g Intravenous 3 times per day  . cloNIDine  0.3 mg Oral TID  . docusate sodium  100 mg Oral BID  . enoxaparin (LOVENOX) injection  40 mg Subcutaneous Q12H  . furosemide  40 mg Oral Daily  . insulin aspart  0-15 Units Subcutaneous TID WC  . irbesartan  75 mg Oral Daily  . levothyroxine  200 mcg Oral QAC breakfast  . metoprolol succinate  25 mg Oral [id number]  . oxyCODONE  15 mg Oral Q12H  . tamsulosin  0.4 mg Oral QPC  breakfast    Continuous Medications:  . sodium chloride 50 mL/hr at 05/28/15 1658     Electrolyte/Renal Profile and Glucose Profile:   Recent Labs Lab 05/28/15 0833  NA 134*  K 4.0  CL 101  CO2 25  BUN 15  CREATININE 1.62*  CALCIUM 9.4  GLUCOSE 191*   Protein Profile: No results for input(s): ALBUMIN in the last 168 hours.  Gastrointestinal Profile: Last BM:  05/26/2015   Nutrition-Focused Physical Exam Findings:  Unable to complete Nutrition-Focused physical exam at this time.    Weight Change: Pt reports intentional weight loss PTA.   Skin:   (Stage II sacral pressure ulcer)   Height:   Ht Readings from Last 1 Encounters:  05/28/15 5\' 5"  (1.651 m)    Weight:   Wt Readings from Last 1 Encounters:  05/28/15 250 lb (113.399 kg)    Wt Readings from Last 10 Encounters:  05/28/15 250 lb (113.399 kg)  05/07/15 250 lb (113.399 kg)  04/02/15 250 lb (113.399 kg)  03/26/15 250 lb (113.399 kg)  02/25/15 250 lb (113.399 kg)  12/22/14 284 lb (128.822 kg)  12/15/14 284 lb 6.3 oz (129 kg)    Ideal Body Weight:   56.8kg  BMI:  Body mass index is 41.6 kg/(m^2).  Estimated Nutritional Needs:   Kcal:  using  IBW of 56.8kg, BEE: 1134kcals, TEE: (IF 1.2-1.4)(AF 1.2) 1632-1904kcals  Protein:  68-85g protein (1.2-1.5g/kg)  Fluid:  1420-1761mL of fluid (25-18mL/kg)  EDUCATION NEEDS:   Education needs no appropriate at this time   [place], RD, LDN Pager [phone]

## 2015-05-29 NOTE — Progress Notes (Signed)
[name]  SUBJECTIVE: No further chest pain. Mild surgical discomfort. Able to stand this am without chest pain   Filed Vitals:   05/28/15 2341 05/29/15 0415 05/29/15 0800 05/29/15 1130  BP: 119/39 158/65 142/64 118/50  Pulse: 64 59 57 58  Temp: 98.2 F (36.8 C) 97.5 F (36.4 C) 97.7 F (36.5 C) 98.5 F (36.9 C)  TempSrc: Oral Oral Oral Oral  Resp: 18 18 18 18   Height:      Weight:      SpO2: 100% 98% 100% 100%    Intake/Output Summary (Last 24 hours) at 05/29/15 1310 Last data filed at 05/29/15 0946  Gross per 24 hour  Intake 995.83 ml  Output    930 ml  Net  65.83 ml    LABS: Basic Metabolic Panel:  Recent Labs  05/28/15 0833  NA 134*  K 4.0  CL 101  CO2 25  GLUCOSE 191*  BUN 15  CREATININE 1.62*  CALCIUM 9.4   Liver Function Tests: No results for input(s): AST, ALT, ALKPHOS, BILITOT, PROT, ALBUMIN in the last 72 hours. No results for input(s): LIPASE, AMYLASE in the last 72 hours. CBC:  Recent Labs  05/28/15 0833  WBC 6.8  HGB 11.6*  HCT 35.5  MCV 87.6  PLT 272   Cardiac Enzymes: No results for input(s): CKTOTAL, CKMB, CKMBINDEX, TROPONINI in the last 72 hours. BNP: Invalid input(s): POCBNP D-Dimer: No results for input(s): DDIMER in the last 72 hours. Hemoglobin A1C: No results for input(s): HGBA1C in the last 72 hours. Fasting Lipid Panel: No results for input(s): CHOL, HDL, LDLCALC, TRIG, CHOLHDL, LDLDIRECT in the last 72 hours. Thyroid Function Tests: No results for input(s): TSH, T4TOTAL, T3FREE, THYROIDAB in the last 72 hours.  Invalid input(s): FREET3 Anemia Panel: No results for input(s): VITAMINB12, FOLATE, FERRITIN, TIBC, IRON, RETICCTPCT in the last 72 hours.   Physical Exam: Blood pressure 118/50, pulse 58, temperature 98.5 F (36.9 C), temperature source Oral, resp. rate 18, height 5\' 5"  (1.651 m), weight 113.399 kg (250 lb), SpO2 100 %.    General appearance: alert and  cooperative Resp: clear to auscultation bilaterally Chest wall: no tenderness Cardio: regular rate and rhythm GI: soft, non-tender; bowel sounds normal; no masses,  no organomegaly Neurologic: Grossly normal  TELEMETRY: Reviewed: telemetry pt in nsr EKG: NSR with no ischemia  ASSESSMENT AND PLAN:  Active Problems:   Primary osteoarthritis of right hip-stable post op.  CHest pain-ekg normal this am. No further chest pain. Pt does not appear to be ischemic. Will discontinue off unit tele.     Teodoro Spray., MD, [facility] 05/29/2015 1:10 PM

## 2015-05-29 NOTE — NC FL2 (Signed)
[place] LEVEL OF CARE SCREENING TOOL     IDENTIFICATION  Patient Name: [name] Birthdate: [birth year] Sex: female Admission Date (Current Location): 05/28/2015  [facility] and [place] Number: Engineering geologist and Address:  [facility], [address] [place], [place] [address]      Provider Number: [id number]  Attending Physician Name and Address:  Hessie Knows, MD  Relative Name and Phone Number:       Current Level of Care: SNF Recommended Level of Care: [place] Prior Approval Number:    Date Approved/Denied:   PASRR Number:    Discharge Plan: SNF    Current Diagnoses: Patient Active Problem List   Diagnosis Date Noted  . Primary osteoarthritis of right hip 05/28/2015   Chief Complaint  Patient presents with  . Hypertension  . Diabetes  . Leg Swelling    takes Lasix  . Hypothyroidism       Orientation ACTIVITIES/SOCIAL BLADDER RESPIRATION    Self, Time, Situation, Place  Family supportive, Active Continent Normal  BEHAVIORAL SYMPTOMS/MOOD NEUROLOGICAL BOWEL NUTRITION STATUS      Continent Diet (Carb modified)  PHYSICIAN VISITS COMMUNICATION OF NEEDS Height & Weight Skin  30 days Verbally   250 lbs. PU Stage and Appropriate Care PU Stage 1 Dressing: Daily PU Stage 2 Dressing: Daily    Surgical wounds  AMBULATORY STATUS RESPIRATION    Assist extensive Normal      Personal Care Assistance Level of Assistance  Bathing, Dressing Bathing Assistance: Maximum assistance   Dressing Assistance: Maximum assistance      Functional Limitations Info  Sight, Hearing, Speech Sight Info: Adequate Hearing Info: Adequate Speech Info: Adequate       SPECIAL CARE FACTORS FREQUENCY  PT (By licensed PT), OT (By licensed OT)     PT Frequency: 5x weekly OT Frequency: 3x weekly           Additional Factors Info  Insulin Sliding Scale               Current Medications  (05/29/2015):  This is the current hospital active medication list Current Facility-Administered Medications  Medication Dose Route Frequency Provider Last Rate Last Dose  . 0.9 %  sodium chloride infusion   Intravenous Continuous Hessie Knows, MD 50 mL/hr at 05/28/15 1658    . acetaminophen (TYLENOL) tablet 650 mg  650 mg Oral Q6H PRN Hessie Knows, MD   650 mg at 05/28/15 2128   Or  . acetaminophen (TYLENOL) suppository 650 mg  650 mg Rectal Q6H PRN Hessie Knows, MD      . acetaminophen (TYLENOL) tablet 1,000 mg  1,000 mg Oral 4 times per day Hessie Knows, MD   1,000 mg at 05/29/15 0651  . alum & mag hydroxide-simeth (MAALOX/MYLANTA) 200-200-20 MG/5ML suspension 30 mL  30 mL Oral Q4H PRN Hessie Knows, MD      . aspirin EC tablet 81 mg  81 mg Oral Daily Hessie Knows, MD   81 mg at 05/29/15 0847  . bisacodyl (DULCOLAX) suppository 10 mg  10 mg Rectal Daily PRN Hessie Knows, MD      . ceFAZolin (ANCEF) IVPB 2 g/50 mL premix  2 g Intravenous 3 times per day Hessie Knows, MD   2 g at 05/29/15 [phone]  . cloNIDine (CATAPRES) tablet 0.3 mg  0.3 mg Oral TID Hessie Knows, MD   0.3 mg at 05/29/15 0847  . diphenhydrAMINE (BENADRYL) 12.5 MG/5ML elixir 12.5-25 mg  12.5-25  mg Oral Q4H PRN Hessie Knows, MD      . docusate sodium (COLACE) capsule 100 mg  100 mg Oral BID Hessie Knows, MD   100 mg at 05/29/15 0847  . enoxaparin (LOVENOX) injection 40 mg  40 mg Subcutaneous Q12H Hessie Knows, MD   40 mg at 05/29/15 0846  . furosemide (LASIX) tablet 40 mg  40 mg Oral Daily Hessie Knows, MD   40 mg at 05/28/15 1647  . insulin aspart (novoLOG) injection 0-15 Units  0-15 Units Subcutaneous TID WC Duanne Guess, PA-C   3 Units at 05/29/15 1253  . irbesartan (AVAPRO) tablet 75 mg  75 mg Oral Daily Hessie Knows, MD   75 mg at 05/29/15 0846  . levothyroxine (SYNTHROID, LEVOTHROID) tablet 200 mcg  200 mcg Oral QAC breakfast Hessie Knows, MD      . magnesium citrate solution 1 Bottle  1 Bottle Oral Once PRN Hessie Knows, MD       . magnesium hydroxide (MILK OF MAGNESIA) suspension 30 mL  30 mL Oral Daily PRN Hessie Knows, MD      . menthol-cetylpyridinium (CEPACOL) lozenge 3 mg  1 lozenge Oral PRN Hessie Knows, MD       Or  . phenol (CHLORASEPTIC) mouth spray 1 spray  1 spray Mouth/Throat PRN Hessie Knows, MD      . methocarbamol (ROBAXIN) tablet 500 mg  500 mg Oral Q6H PRN Hessie Knows, MD       Or  . methocarbamol (ROBAXIN) 500 mg in dextrose 5 % 50 mL IVPB  500 mg Intravenous Q6H PRN Hessie Knows, MD      . metoCLOPramide (REGLAN) tablet 5-10 mg  5-10 mg Oral Q8H PRN Hessie Knows, MD       Or  . metoCLOPramide (REGLAN) injection 5-10 mg  5-10 mg Intravenous Q8H PRN Hessie Knows, MD      . metoprolol succinate (TOPROL-XL) 24 hr tablet 25 mg  25 mg Oral Tedra Senegal, MD   25 mg at 05/29/15 0847  . morphine 2 MG/ML injection 2 mg  2 mg Intravenous Q1H PRN Hessie Knows, MD   2 mg at 05/28/15 1706  . ondansetron (ZOFRAN) tablet 4 mg  4 mg Oral Q6H PRN Hessie Knows, MD       Or  . ondansetron [facility]) injection 4 mg  4 mg Intravenous Q6H PRN Hessie Knows, MD      . ondansetron (ZOFRAN-ODT) disintegrating tablet 4 mg  4 mg Oral Q8H PRN Hessie Knows, MD      . oxyCODONE (Oxy IR/ROXICODONE) immediate release tablet 5-10 mg  5-10 mg Oral Q3H PRN Hessie Knows, MD   5 mg at 05/28/15 1645  . oxyCODONE (OXYCONTIN) 12 hr tablet 15 mg  15 mg Oral Q12H Hessie Knows, MD   15 mg at 05/29/15 0847  . tamsulosin (FLOMAX) capsule 0.4 mg  0.4 mg Oral QPC breakfast Hessie Knows, MD   0.4 mg at 05/29/15 0847  . tapentadol (NUCYNTA) tablet 50-100 mg  50-100 mg Oral Q4H PRN Duanne Guess, PA-C   50 mg at 05/28/15 2342  . zolpidem (AMBIEN) tablet 5 mg  5 mg Oral QHS PRN Hessie Knows, MD         Discharge Medications: Please see discharge summary for a list of discharge medications.  Relevant Imaging Results:  Relevant Lab Results:  Recent Labs    Additional [place], LCSW

## 2015-05-30 LAB — BASIC METABOLIC PANEL
Anion gap: 3 — ABNORMAL LOW (ref 5–15)
BUN: 18 mg/dL (ref 6–20)
CHLORIDE: 109 mmol/L (ref 101–111)
CO2: 26 mmol/L (ref 22–32)
CREATININE: 1.71 mg/dL — AB (ref 0.44–1.00)
Calcium: 8.1 mg/dL — ABNORMAL LOW (ref 8.9–10.3)
GFR, EST AFRICAN AMERICAN: 36 mL/min — AB (ref >60)
GFR, EST NON AFRICAN AMERICAN: 31 mL/min — AB (ref >60)
Glucose, Bld: 211 mg/dL — ABNORMAL HIGH (ref 65–99)
Potassium: 4.1 mmol/L (ref 3.5–5.1)
SODIUM: 138 mmol/L (ref 135–145)

## 2015-05-30 LAB — GLUCOSE, CAPILLARY
GLUCOSE-CAPILLARY: 197 mg/dL — AB (ref 65–99)
Glucose-Capillary: 212 mg/dL — ABNORMAL HIGH (ref 65–99)
Glucose-Capillary: 127 mg/dL — ABNORMAL HIGH (ref 65–99)

## 2015-05-30 LAB — HEMOGLOBIN: HEMOGLOBIN: 8.7 g/dL — AB (ref 12.0–16.0)

## 2015-05-30 MED ORDER — ENOXAPARIN SODIUM 40 MG/0.4ML ~~LOC~~ SOLN: 40.0000 mg | SUBCUTANEOUS | Status: DC

## 2015-05-30 MED ORDER — OXYCODONE HCL ER 15 MG PO T12A: 15.0000 mg | EXTENDED_RELEASE_TABLET | Freq: Two times a day (BID) | ORAL | Status: DC

## 2015-05-30 MED ORDER — TAPENTADOL HCL 50 MG PO TABS: 50.0000 mg | ORAL_TABLET | ORAL | Status: DC | PRN

## 2015-05-30 NOTE — Progress Notes (Signed)
Physical Therapy Treatment Patient Details Name: BLAIKE TELL MRN: [id number] DOB: Jan 05, 1956 Today's Date: 05/30/2015    History of Present Illness Pt here with L hip replacement, anterior approach    PT Comments    Pt is able to increase ambulation and some AROM during exercises but continues to struggle with transfer and bed mobility.  She also displays very poor posture and becomes very fatigued with ambulation during which she has poor DF control in both and ankles and can talk only short shuffling gait pattern.  Follow Up Recommendations  SNF     Equipment Recommendations       Recommendations for Other Services       Precautions / Restrictions Precautions Precautions: Anterior Hip;Fall Restrictions LLE Weight Bearing: Weight bearing as tolerated    Mobility  Bed Mobility Overal bed mobility: Needs Assistance Bed Mobility: Sit to Supine       Sit to supine: Max assist   General bed mobility comments: Pt unable to lift either LE to try getting back into bed, needs heavy assist  Transfers Overall transfer level: Needs assistance Equipment used: Rolling walker (2 wheeled) Transfers: Sit to/from Stand Sit to Stand: Min assist         General transfer comment: Again pt unable to get to standing w/o direct assit, even from raised recliner height (pillows).  She does show good effort, but is weak.   Ambulation/Gait Ambulation/Gait assistance: Min assist Ambulation Distance (Feet): 30 Feet Assistive device: Rolling walker (2 wheeled)       General Gait Details: Pt able to maintain slow but consistent speed.  She displays poor posture to start that becomes worse as she fatigues and pt again struggles to clear either foot with slow, shuffling gait.    Stairs            Wheelchair Mobility    Modified Rankin (Stroke Patients Only)       Balance                                    Cognition Arousal/Alertness:  Awake/alert Behavior During Therapy: WFL for tasks assessed/performed Overall Cognitive Status: Within Functional Limits for tasks assessed                      Exercises Total Joint Exercises Ankle Circles/Pumps: AROM;Right;PROM;Left Quad Sets: Strengthening;Right;10 reps Gluteal Sets: 10 reps;Strengthening Short Arc Quad: AROM;10 reps Heel Slides: 10 reps;AROM Hip ABduction/ADduction: AROM;10 reps Straight Leg Raises: AAROM;10 reps    General Comments        Pertinent Vitals/Pain Pain Score: 3  (reports she has had moments of random increased pain)    Home Living                      Prior Function            PT Goals (current goals can now be found in the care plan section) Progress towards PT goals: Progressing toward goals    Frequency  BID    PT Plan Current plan remains appropriate    Co-evaluation             End of Session Equipment Utilized During Treatment: Gait belt Activity Tolerance: Patient limited by fatigue Patient left: with bed alarm set     Time: [id number] PT Time Calculation (min) (ACUTE ONLY): 31 min  Charges:  $Gait Training: 8-22  mins $Therapeutic Exercise: 8-22 mins                    G Codes:     Wayne Both, [place], DPT [phone]  Kreg Shropshire 05/30/2015, 5:11 PM

## 2015-05-30 NOTE — Discharge Summary (Signed)
Physician Discharge Summary  Subjective: 2 Days Post-Op Procedure(s) (LRB): TOTAL HIP ARTHROPLASTY ANTERIOR APPROACH (Right) Patient reports pain as mild.   Patient seen in rounds with Dr. Roland Rack. Patient is well, and has had no acute complaints or problems Patient is ready to go to skilled nursing at [facility]  Physician Discharge Summary  Patient ID: [name] MRN: [id number] DOB/AGE: [birth year] 60 y.o.  Admit date: 05/28/2015 Discharge date: 06/01/2015  Admission Diagnoses:  Discharge Diagnoses:  Active Problems:   Primary osteoarthritis of right hip   Discharged Condition: fair  Hospital Course: The patient is status post right anterior approach total hip replacement. The patient has done fine since surgery. The patient ambulated slowly with physical therapy. The patient did have a wound VAC which kept her wound protected. She had a drain removed on postop day 2 and she had slightly lower hemoglobin on postop day 2 at 8.7. The patient did well as her pain management continued to improve. The patient was ready to go to rehabilitation on 06/01/2015.  Treatments: surgery:  PROCEDURE: Procedure(s): TOTAL HIP ARTHROPLASTY ANTERIOR APPROACH (Right)  SURGEON: Laurene Footman, MD  ASSISTANTS: None  ANESTHESIA: spinal  EBL: Total I/O In: 1300 [I.V.:1300] Out: 950 [Urine:550; Blood:400]  BLOOD ADMINISTERED:none  DRAINS: (Medium) Hemovact drain(s) in the Subcutaneous layer with Suction Open   LOCAL MEDICATIONS USED: MARCAINE   SPECIMEN: Source of Specimen: Femoral head right  DISPOSITION OF SPECIMEN: PATHOLOGY  COUNTS: YES  TOURNIQUET: * No tourniquets in log *  IMPLANTS: Medacta AMIS 3 STEM with 54 mm Mpact cup DM with liner and S 28 mm head  Discharge Exam: Blood pressure 149/50, pulse 73, temperature 99.3 F (37.4 C), temperature source Oral, resp. rate 18, height 5\' 5"  (1.651 m), weight 113.399 kg (250 lb), SpO2 99 %.   Disposition:  01-Home or Self Care     Medication List    STOP taking these medications        etodolac 400 MG tablet  Commonly known as:  LODINE     oxyCODONE-acetaminophen 5-325 MG tablet  Commonly known as:  ROXICET     traMADol 50 MG tablet  Commonly known as:  ULTRAM      TAKE these medications        aspirin EC 81 MG tablet  Take 81 mg by mouth daily.     cloNIDine 0.3 MG tablet  Commonly known as:  CATAPRES  Take 1 tablet by mouth 3  times daily     enoxaparin 40 MG/0.4ML injection  Commonly known as:  LOVENOX  Inject 0.4 mLs (40 mg total) into the skin daily.     furosemide 40 MG tablet  Commonly known as:  LASIX  Take 1 tablet by mouth  daily     levothyroxine 200 MCG tablet  Commonly known as:  SYNTHROID, LEVOTHROID  Take 1 tablet by mouth  daily     metFORMIN 500 MG tablet  Commonly known as:  GLUCOPHAGE  Take 1 tablet by mouth two  times daily     metoprolol succinate 50 MG 24 hr tablet  Commonly known as:  TOPROL-XL  Take 1 tablet by mouth  daily     ondansetron 4 MG disintegrating tablet  Commonly known as:  ZOFRAN ODT  Take 1 tablet (4 mg total) by mouth every 8 (eight) hours as needed for nausea or vomiting.     oxyCODONE 15 mg 12 hr tablet  Commonly known as:  OXYCONTIN  Take 1  tablet (15 mg total) by mouth every 12 (twelve) hours.     tamsulosin 0.4 MG Caps capsule  Commonly known as:  FLOMAX  Take 1 capsule (0.4 mg total) by mouth daily after breakfast.     tapentadol 50 MG Tabs tablet  Commonly known as:  NUCYNTA  Take 1-2 tablets (50-100 mg total) by mouth every 4 (four) hours as needed for severe pain.     telmisartan 80 MG tablet  Commonly known as:  MICARDIS  Take 1 tablet by mouth  daily           Follow-up Information    Follow up with MENZ,MICHAEL, MD In 2 weeks.   Specialty:  Orthopedic Surgery   Why:  For wound re-check and staple removal   Contact information:   [address] [place] [address] [phone]       Signed: Prescott Parma, Lem Peary 05/30/2015, 6:17 AM   Objective: Vital signs in last 24 hours: Temp:  [97.7 F (36.5 C)-99.3 F (37.4 C)] 99.3 F (37.4 C) (12/03 0424) Pulse Rate:  [57-73] 73 (12/03 0424) Resp:  [17-20] 18 (12/03 0424) BP: (118-154)/(50-64) 149/50 mmHg (12/03 0424) SpO2:  [99 %-100 %] 99 % (12/03 0424)  Intake/Output from previous day:  Intake/Output Summary (Last 24 hours) at 05/30/15 0617 Last data filed at 05/30/15 0600  Gross per 24 hour  Intake 2213.33 ml  Output    875 ml  Net 1338.33 ml    Intake/Output this shift: Total I/O In: 1382.5 [I.V.:1132.5; IV Piggyback:250] Out: 450 [Urine:450]  Labs:  Recent Labs  05/28/15 0833 05/30/15 0335  HGB 11.6* 8.7*    Recent Labs  05/28/15 0833  WBC 6.8  RBC 4.05  HCT 35.5  PLT 272    Recent Labs  05/28/15 0833 05/30/15 0335  NA 134* 138  K 4.0 4.1  CL 101 109  CO2 25 26  BUN 15 18  CREATININE 1.62* 1.71*  GLUCOSE 191* 211*  CALCIUM 9.4 8.1*    Recent Labs  05/28/15 0833  INR 1.22    EXAM: General - Patient is Alert and Oriented Extremity - Sensation intact distally Dorsiflexion/Plantar flexion intact No cellulitis present Compartment soft Incision - clean, dry, with a Provena wound VAC in place. Motor Function -  the patient was able to dorsiflex and plantarflex her feet with ambulation to about 8 feet.  Assessment/Plan: 2 Days Post-Op Procedure(s) (LRB): TOTAL HIP ARTHROPLASTY ANTERIOR APPROACH (Right) Procedure(s) (LRB): TOTAL HIP ARTHROPLASTY ANTERIOR APPROACH (Right) Past Medical History  Diagnosis Date  . Hypertension   . Hypothyroidism   . Diabetes mellitus without complication ([place]) [id number]  . Kidney stone 11/2014    history of/STAGE 4 KIDNEY DISEASE PER DR Juleen China  . Lymphedema   . Multiple open wounds of lower leg     PT BEING SEEN BY THE [facility] CENTER FOR CHRONIC BLISTERS PER PT   Active Problems:   Primary osteoarthritis of right  hip  Estimated body mass index is 41.6 kg/(m^2) as calculated from the following:   Height as of this encounter: 5\' 5"  (1.651 m).   Weight as of this encounter: 113.399 kg (250 lb). Discharge to SNF on 06/01/2015 Diet - Regular diet Follow up - in 2 weeks Activity - WBAT Disposition - Rehab Condition Upon Discharge - Stable DVT Prophylaxis - Lovenox and TED hose  Reche Dixon, PA-C Orthopaedic Surgery 05/30/2015, 6:17 AM

## 2015-05-30 NOTE — Progress Notes (Signed)
  Subjective: 2 Days Post-Op Procedure(s) (LRB): TOTAL HIP ARTHROPLASTY ANTERIOR APPROACH (Right) Patient reports pain as mild.   Patient seen in rounds with Dr. Roland Rack. Patient is well, and has had no acute complaints or problems Plan is to go Skilled nursing facility after hospital stay. Negative for chest pain and shortness of breath Fever: no Gastrointestinal: Negative for nausea and vomiting  Objective: Vital signs in last 24 hours: Temp:  [97.7 F (36.5 C)-99.3 F (37.4 C)] 99.3 F (37.4 C) (12/03 0424) Pulse Rate:  [57-73] 73 (12/03 0424) Resp:  [17-20] 18 (12/03 0424) BP: (118-154)/(50-64) 149/50 mmHg (12/03 0424) SpO2:  [99 %-100 %] 99 % (12/03 0424)  Intake/Output from previous day:  Intake/Output Summary (Last 24 hours) at 05/30/15 0609 Last data filed at 05/30/15 0600  Gross per 24 hour  Intake 2213.33 ml  Output    875 ml  Net 1338.33 ml    Intake/Output this shift: Total I/O In: 1382.5 [I.V.:1132.5; IV Piggyback:250] Out: 450 [Urine:450]  Labs:  Recent Labs  05/28/15 0833 05/30/15 0335  HGB 11.6* 8.7*    Recent Labs  05/28/15 0833  WBC 6.8  RBC 4.05  HCT 35.5  PLT 272    Recent Labs  05/28/15 0833 05/30/15 0335  NA 134* 138  K 4.0 4.1  CL 101 109  CO2 25 26  BUN 15 18  CREATININE 1.62* 1.71*  GLUCOSE 191* 211*  CALCIUM 9.4 8.1*    Recent Labs  05/28/15 0833  INR 1.22     EXAM General - Patient is Alert and Oriented Extremity - Neurovascular intact Dorsiflexion/Plantar flexion intact No cellulitis present Compartment soft Dressing/Incision - clean, dry, the Hemovac was removed. [facility] is still in place. Motor Function - intact, moving foot and toes well on exam. The patient ambulated 8 feet with physical therapy  Past Medical History  Diagnosis Date  . Hypertension   . Hypothyroidism   . Diabetes mellitus without complication ([place]) [id number]  . Kidney stone 11/2014    history of/STAGE 4 KIDNEY DISEASE PER DR  Juleen China  . Lymphedema   . Multiple open wounds of lower leg     PT BEING SEEN BY THE [facility] FOR CHRONIC BLISTERS PER PT    Assessment/Plan: 2 Days Post-Op Procedure(s) (LRB): TOTAL HIP ARTHROPLASTY ANTERIOR APPROACH (Right) Active Problems:   Primary osteoarthritis of right hip  Estimated body mass index is 41.6 kg/(m^2) as calculated from the following:   Height as of this encounter: 5\' 5"  (1.651 m).   Weight as of this encounter: 113.399 kg (250 lb). Up with therapy  The plan is for the patient to be discharged to rehabilitation on Monday.  DVT Prophylaxis - Lovenox, Foot Pumps and TED hose Weight-Bearing as tolerated to right leg  Reche Dixon, PA-C Orthopaedic Surgery 05/30/2015, 6:09 AM

## 2015-05-30 NOTE — Progress Notes (Signed)
Physical Therapy Treatment Patient Details Name: [name] MRN: [id number] DOB: 1955/09/22 Today's Date: 05/30/2015    History of Present Illness Pt here with L hip replacement, anterior approach    PT Comments    Pt is able to ambulate to the door today (chair following) but she continues to have very slow, labored gait with poor posture and lacking confidence.  She displays good effort with bed exercises and mobility but continues to need some assist with each.  Pt eager to work with PT but is pain and strength limited.   Follow Up Recommendations  SNF     Equipment Recommendations   (pt has rolling walker)    Recommendations for Other Services       Precautions / Restrictions Precautions Precautions: Anterior Hip Restrictions LLE Weight Bearing: Weight bearing as tolerated    Mobility  Bed Mobility Overal bed mobility: Needs Assistance Bed Mobility: Sit to Supine     Supine to sit: Mod assist     General bed mobility comments: Pt again showing good effort with attempt to get to sitting, but does need considerable assist to get to upright at EOB  Transfers Overall transfer level: Needs assistance Equipment used: Rolling walker (2 wheeled) Transfers: Sit to/from Stand Sit to Stand: Min assist         General transfer comment: Two attempts to get to standing w/o direct assist, however pt unable.  She does need assist to rise from bed and get to standing.   Ambulation/Gait Ambulation/Gait assistance: Min assist Ambulation Distance (Feet): 15 Feet Assistive device: Rolling walker (2 wheeled)       General Gait Details: Chair following, pt shows good effort but is very slow and fatigues quickly with ambulation to the door. She continues to have very shuffling gait (L toe clearance an issue) as well as very forward posture.    Stairs            Wheelchair Mobility    Modified Rankin (Stroke Patients Only)       Balance                                     Cognition Arousal/Alertness: Awake/alert Behavior During Therapy: WFL for tasks assessed/performed Overall Cognitive Status: Within Functional Limits for tasks assessed                      Exercises Total Joint Exercises Ankle Circles/Pumps: AROM;Right;PROM;Left Quad Sets: Strengthening;Right;10 reps Gluteal Sets: 10 reps;Strengthening Short Arc Quad: AAROM;10 reps Heel Slides: AAROM;10 reps Hip ABduction/ADduction: AROM;10 reps    General Comments        Pertinent Vitals/Pain Pain Score: 5     Home Living                      Prior Function            PT Goals (current goals can now be found in the care plan section) Progress towards PT goals: Progressing toward goals    Frequency  BID    PT Plan Current plan remains appropriate    Co-evaluation             End of Session Equipment Utilized During Treatment: Gait belt Activity Tolerance: Patient limited by fatigue Patient left: with chair alarm set     Time: [id number] PT Time Calculation (min) (ACUTE ONLY): 26 min  Charges:  $Gait Training: 8-22 mins $Therapeutic Exercise: 8-22 mins                    G Codes:     Wayne Both, [place], DPT [phone]  Kreg Shropshire 05/30/2015, 1:11 PM

## 2015-05-31 DIAGNOSIS — L8990 Pressure ulcer of unspecified site, unspecified stage: Secondary | ICD-10-CM | POA: Insufficient documentation

## 2015-05-31 LAB — GLUCOSE, CAPILLARY
GLUCOSE-CAPILLARY: 258 mg/dL — AB (ref 65–99)
Glucose-Capillary: 196 mg/dL — ABNORMAL HIGH (ref 65–99)
Glucose-Capillary: 157 mg/dL — ABNORMAL HIGH (ref 65–99)
Glucose-Capillary: 236 mg/dL — ABNORMAL HIGH (ref 65–99)

## 2015-05-31 LAB — CBC
HCT: 25.8 % — ABNORMAL LOW (ref 35.0–47.0)
Hemoglobin: 8.4 g/dL — ABNORMAL LOW (ref 12.0–16.0)
MCH: 28.6 pg (ref 26.0–34.0)
MCHC: 32.4 g/dL (ref 32.0–36.0)
MCV: 88.5 fL (ref 80.0–100.0)
PLATELETS: 186 10*3/uL (ref 150–440)
RBC: 2.92 MIL/uL — AB (ref 3.80–5.20)
RDW: 13.5 % (ref 11.5–14.5)
WBC: 8.5 10*3/uL (ref 3.6–11.0)

## 2015-05-31 NOTE — Progress Notes (Signed)
Physical Therapy Treatment Patient Details Name: [name] MRN: [id number] DOB: Apr 18, 1956 Today's Date: 05/31/2015    History of Present Illness Pt here with L hip replacement, anterior approach    PT Comments    Pt shows good effort t/o session and though she c/o general pain she is eager to work with PT and shows some improvement with ambulation and exercises.  She continues to be slow and labored with essentially all acts, but is safe and has shown consistent gains.   Follow Up Recommendations  SNF     Equipment Recommendations       Recommendations for Other Services       Precautions / Restrictions Precautions Precautions: Anterior Hip;Fall Restrictions LLE Weight Bearing: Weight bearing as tolerated    Mobility  Bed Mobility Overal bed mobility: Needs Assistance Bed Mobility: Supine to Sit     Supine to sit: Min guard     General bed mobility comments: Pt needing cuing and encouragement to get to EOB, but she is able to do most of the mobility acts with just extra time  Transfers Overall transfer level: Modified independent Equipment used: Rolling walker (2 wheeled) Transfers: Sit to/from Stand Sit to Stand: Min guard         General transfer comment: Pt better able to get to standing today, still needing much cuing and encouragement  Ambulation/Gait Ambulation/Gait assistance: Min assist Ambulation Distance (Feet): 45 Feet Assistive device: Rolling walker (2 wheeled)       General Gait Details: Pt a little better with posture and shows some increased speed/confidence though she does remain slow and labored with the effort   Stairs            Wheelchair Mobility    Modified Rankin (Stroke Patients Only)       Balance                                    Cognition Arousal/Alertness: Awake/alert Behavior During Therapy: WFL for tasks assessed/performed Overall Cognitive Status: Within Functional Limits for tasks  assessed                      Exercises Total Joint Exercises Ankle Circles/Pumps: AROM;Right;PROM;Left Quad Sets: Strengthening;Right;10 reps Gluteal Sets: 10 reps;Strengthening Short Arc Quad: AROM;10 reps Heel Slides: 10 reps;AROM Hip ABduction/ADduction: AROM;10 reps Straight Leg Raises: AAROM;10 reps    General Comments        Pertinent Vitals/Pain Pain Score: 5     Home Living                      Prior Function            PT Goals (current goals can now be found in the care plan section) Progress towards PT goals: Progressing toward goals    Frequency  BID    PT Plan Current plan remains appropriate    Co-evaluation             End of Session Equipment Utilized During Treatment: Gait belt Activity Tolerance: Patient limited by fatigue Patient left: with bed alarm set     Time: [id number] PT Time Calculation (min) (ACUTE ONLY): 25 min  Charges:  $Gait Training: 8-22 mins $Therapeutic Exercise: 8-22 mins                    G Codes:  [name], PT, DPT [phone]  [name] 05/31/2015, 1:14 PM

## 2015-05-31 NOTE — Clinical Social Work Note (Signed)
Clinical Social Work Assessment  Patient Details  Name: [name] MRN: [id number] Date of Birth: [birth year]  Date of referral:  05/31/15               Reason for consult:  Abuse/Neglect, Facility Placement                Permission sought to share information with:  Case Manager, Customer service manager Permission granted to share information::  Yes, Verbal Permission Granted  Name::        Agency::  SNFs  Relationship::     Contact Information:     Housing/Transportation Living arrangements for the past 2 months:  [place] of Information:  Patient, Engineer, materials, Medical Team Patient Interpreter Needed:  None Criminal Activity/Legal Involvement Pertinent to Current Situation/Hospitalization:  No - Comment as needed Significant Relationships:  Warehouse manager, Friend Lives with:  Self Do you feel safe going back to the place where you live?  No Need for family participation in patient care:  No (Coment)  Care giving concerns:  Pt lives alone. Friends of the Pt report poor living conditions and concern for Pt's ability to care of herself at home.    Social Worker assessment / plan:  CSW was referred to Pt to assist with dc planning. Pt is employed but unable to work since July due to declining health over the last few months. Pt reports that she is single, no children. She has an elderly neighbor who has been assisting her over the last few months, but her neighbor has now had a heart attack and her friend and friend's husband have started assisting Pt. Pt shared that she has had difficulty keeping up her home since her health started to decline. She shared that her friends are helping and that a church in the community has planned to build her a ramp in the next few weeks. CSW discussed recommendation of SNF for STR at dc. Pt is in agreement and prefers [facility] if possible. CSW confirmed SN[facility] bed at [facility]. Pt notified that her insurance  will require preauthorization which will not be received before Monday.   Employment status:   Employed, on short-term disability Insurance information:  Managed Care PT Recommendations:  [place] / Referral to community resources:  [place]  Patient/Family's Response to care:  Pt adjusting well after surgery. No friends or family in the room. RN confirmed Pt doing well with movement after surgery.   Patient/Family's Understanding of and Emotional Response to Diagnosis, Current Treatment, and Prognosis:  Pt is hopeful to return to work if possible, but is planning on drawing her social security as soon as next spring.   Emotional Assessment Appearance:    Attitude/Demeanor/Rapport:   (cooperative) Affect (typically observed):  Accepting, Hopeful Orientation:  Oriented to Self, Oriented to Place, Oriented to  Time, Oriented to Situation Alcohol / Substance use:  Never Used Psych involvement (Current and /or in the community):  No (Comment)  Discharge Needs  Concerns to be addressed:  Adjustment to Illness, Home Safety Concerns Readmission within the last 30 days:  No Current discharge risk:  Dependent with Mobility, Lives alone Barriers to Discharge:  Continued Medical Work up   [employer], LCSW 05/31/2015, 10:45 PM

## 2015-05-31 NOTE — Progress Notes (Signed)
  Subjective: 3 Days Post-Op Procedure(s) (LRB): TOTAL HIP ARTHROPLASTY ANTERIOR APPROACH (Right) Patient reports pain as mild.   Patient seen in rounds with Dr. Roland [name]. Patient is well, and has had no acute complaints or problems Plan is to go Skilled nursing facility after hospital stay. Negative for chest pain and shortness of breath Fever: no Gastrointestinal: Negative for nausea and vomiting  Objective: Vital signs in last 24 hours: Temp:  [98.3 F (36.8 C)-99.8 F (37.7 C)] 98.4 F (36.9 C) (12/04 0345) Pulse Rate:  [60-79] 66 (12/04 0345) Resp:  [16-18] 18 (12/04 0345) BP: (126-170)/(40-56) 126/40 mmHg (12/04 0345) SpO2:  [97 %-99 %] 98 % (12/04 0345)  Intake/Output from previous day:  Intake/Output Summary (Last 24 hours) at 05/31/15 0649 Last data filed at 05/31/15 0005  Gross per 24 hour  Intake 1161.67 ml  Output    550 ml  Net 611.67 ml    Intake/Output this shift:    Labs:  Recent Labs  05/28/15 0833 05/30/15 0335 05/31/15 0331  HGB 11.6* 8.7* 8.4*    Recent Labs  05/28/15 0833 05/31/15 0331  WBC 6.8 8.5  RBC 4.05 2.92*  HCT 35.5 25.8*  PLT 272 186    Recent Labs  05/28/15 0833 05/30/15 0335  NA 134* 138  K 4.0 4.1  CL 101 109  CO2 25 26  BUN 15 18  CREATININE 1.62* 1.71*  GLUCOSE 191* 211*  CALCIUM 9.4 8.1*    Recent Labs  05/28/15 0833  INR 1.22     EXAM General - Patient is Alert and Oriented Extremity - Neurovascular intact Dorsiflexion/Plantar flexion intact No cellulitis present Compartment soft Dressing/Incision - clean, dry, intact. [facility] is still in place. Motor Function - intact, moving foot and toes well on exam. The patient ambulated 30 feet with physical therapy. The patient is up to the bedside commode this morning.  Past Medical History  Diagnosis Date  . Hypertension   . Hypothyroidism   . Diabetes mellitus without complication ([name]) [id number]  . Kidney stone 11/2014    history of/STAGE 4  KIDNEY DISEASE PER DR [name]  . Lymphedema   . Multiple open wounds of lower leg     PT BEING SEEN BY THE WOUND CENTER FOR CHRONIC BLISTERS PER PT    Assessment/Plan: 3 Days Post-Op Procedure(s) (LRB): TOTAL HIP ARTHROPLASTY ANTERIOR APPROACH (Right) Active Problems:   Primary osteoarthritis of right hip  Estimated body mass index is 41.6 kg/(m^2) as calculated from the following:   Height as of this encounter: 5\' 5"  (1.651 m).   Weight as of this encounter: 113.399 kg (250 lb). Up with therapy  The plan is for the patient to be discharged to rehabilitation on Monday.  DVT Prophylaxis - Lovenox, Foot Pumps and TED hose Weight-Bearing as tolerated to right leg  Autumn Dixon, PA-C Orthopaedic Surgery 05/31/2015, 6:49 AM

## 2015-05-31 NOTE — Clinical Social Work Placement (Signed)
   CLINICAL SOCIAL WORK PLACEMENT  NOTE  Date:  05/31/2015  Patient Details  Name: [name] MRN: [id number] Date of Birth: [birth year]  Clinical Social Work is seeking post-discharge placement for this patient at the [place] level of care (*CSW will initial, date and re-position this form in  chart as items are completed):  Yes   Patient/family provided with [facility] Work Department's list of facilities offering this level of care within the geographic area requested by the patient (or if unable, by the patient's family).  Yes   Patient/family informed of their freedom to choose among providers that offer the needed level of care, that participate in Medicare, Medicaid or managed care program needed by the patient, have an available bed and are willing to accept the patient.  Yes   Patient/family informed of [facility]'s ownership interest in [facility] and [facility], as well as of the fact that they are under no obligation to receive care at these facilities.  PASRR submitted to EDS on 05/31/15     PASRR number received on 05/31/15     Existing PASRR number confirmed on       FL2 transmitted to all facilities in geographic area requested by pt/family on 05/29/15     FL2 transmitted to all facilities within larger geographic area on       Patient informed that his/her managed care company has contracts with or will negotiate with certain facilities, including the following:        Yes   Patient/family informed of bed offers received.  Patient chooses bed at [facility]     Physician recommends and patient chooses bed at      Patient to be transferred to [facility] on  .  Patient to be transferred to facility by       Patient family notified on   of transfer.  Name of family member notified:        PHYSICIAN       Additional Comment:    _______________________________________________ Alonna Buckler,  LCSW 05/31/2015, 11:06 PM

## 2015-06-01 ENCOUNTER — Encounter
Admission: RE | Admit: 2015-06-01 | Discharge: 2015-06-01 | Disposition: A | Discharge status: Home / Self Care | Payer: Managed Care, Other (non HMO) | Source: Ambulatory Visit | Attending: Internal Medicine | Admitting: Internal Medicine

## 2015-06-01 DIAGNOSIS — E119 Type 2 diabetes mellitus without complications: Secondary | ICD-10-CM | POA: Insufficient documentation

## 2015-06-01 DIAGNOSIS — Z794 Long term (current) use of insulin: Secondary | ICD-10-CM | POA: Insufficient documentation

## 2015-06-01 LAB — GLUCOSE, CAPILLARY
GLUCOSE-CAPILLARY: 158 mg/dL — AB (ref 65–99)
GLUCOSE-CAPILLARY: 233 mg/dL — AB (ref 65–99)
Glucose-Capillary: 206 mg/dL — ABNORMAL HIGH (ref 65–99)
Glucose-Capillary: 193 mg/dL — ABNORMAL HIGH (ref 65–99)

## 2015-06-01 LAB — SURGICAL PATHOLOGY

## 2015-06-01 NOTE — Progress Notes (Signed)
   Subjective: 4 Days Post-Op Procedure(s) (LRB): TOTAL HIP ARTHROPLASTY ANTERIOR APPROACH (Right) Patient reports pain as 3 on 0-10 scale and 4 on 0-10 scale.   Patient is well, and has had no acute complaints or problems We will start therapy today.  Plan is to go Rehab after hospital stay.  Objective: Vital signs in last 24 hours: Temp:  [98.4 F (36.9 C)-99.1 F (37.3 C)] 98.5 F (36.9 C) (12/05 0732) Pulse Rate:  [66-82] 73 (12/05 0732) Resp:  [16-18] 18 (12/05 0732) BP: (124-161)/(38-48) 161/48 mmHg (12/05 0732) SpO2:  [98 %-100 %] 98 % (12/05 0732)  Intake/Output from previous day: 12/04 0701 - 12/05 0700 In: 960 [P.O.:960] Out: 0  Intake/Output this shift:     Recent Labs  05/30/15 0335 05/31/15 0331  HGB 8.7* 8.4*    Recent Labs  05/31/15 0331  WBC 8.5  RBC 2.92*  HCT 25.8*  PLT 186    Recent Labs  05/30/15 0335  NA 138  K 4.1  CL 109  CO2 26  BUN 18  CREATININE 1.71*  GLUCOSE 211*  CALCIUM 8.1*   No results for input(s): LABPT, INR in the last 72 hours.  EXAM General - Patient is Alert, Appropriate and Oriented Extremity - Neurovascular intact Sensation intact distally Intact pulses distally Dorsiflexion/Plantar flexion intact Dressing - dressing C/D/I, no drainage and wound vac intact Motor Function - intact, moving foot and toes well on exam.   Past Medical History  Diagnosis Date  . Hypertension   . Hypothyroidism   . Diabetes mellitus without complication ([place]) [id number]  . Kidney stone 11/2014    history of/STAGE 4 KIDNEY DISEASE PER DR Juleen China  . Lymphedema   . Multiple open wounds of lower leg     PT BEING SEEN BY THE WOUND CENTER FOR CHRONIC BLISTERS PER PT    Assessment/Plan:   4 Days Post-Op Procedure(s) (LRB): TOTAL HIP ARTHROPLASTY ANTERIOR APPROACH (Right) Active Problems:   Primary osteoarthritis of right hip   Pressure ulcer  Estimated body mass index is 41.6 kg/(m^2) as calculated from the following:   Height  as of this encounter: 5\' 5"  (1.651 m).   Weight as of this encounter: 113.399 kg (250 lb). Advance diet Up with therapy Discharge to SNF  Follow up with [facility] ortho in 2 weeks Disposable wound vac will last 1 week. Remove wound vac, apply dressing when battery dies.  DVT Prophylaxis - Aspirin, Lovenox, Foot Pumps and TED hose Weight-Bearing as tolerated to right leg D/C O2 and Pulse OX and try on Room Air  T. Rachelle Hora, PA-C [place] 06/01/2015, 7:48 AM

## 2015-06-01 NOTE — Progress Notes (Addendum)
Physical Therapy Treatment Patient Details Name: [name] MRN: [id number] DOB: 1955/11/18 Today's Date: 06/01/2015    History of Present Illness Pt here with L hip replacement, anterior approach    PT Comments    All bed mobility and transfers take some increased time/effort; but becoming less of a struggle for pt. Pt demonstrates improved sit to stand transfer with cues to sequence of quad set and glut set for upright posture. Pt able to ambulate 6 feet bed to chair and chair to bedside commode with improved quality and confidence 3 different times. Mild heart rate increase on first walk from 101 to 121 beats per minute with recovery in less than a minute. Nursing aide make aware. No significant changes on subsequent transfers/walks. Pt to bed discharged to skilled nursing facility today for continued strengthening and progress of functional mobility.   Follow Up Recommendations  SNF     Equipment Recommendations       Recommendations for Other Services       Precautions / Restrictions Precautions Precautions: Anterior Hip;Fall Restrictions Weight Bearing Restrictions: Yes LLE Weight Bearing: Weight bearing as tolerated    Mobility  Bed Mobility Overal bed mobility: Needs Assistance Bed Mobility: Supine to Sit     Supine to sit: Supervision (increased time to scoot to edge of bed)     General bed mobility comments: use of rails and increased time to scoot edge of bed  Transfers Overall transfer level: Needs assistance Equipment used: Rolling walker (2 wheeled) Transfers: Sit to/from Stand Sit to Stand: Min guard (cues for quad set and glute set to achieve upright stand)         General transfer comment: cues for hand placement and sequence for quad set and glute set to achieve full upright stand  Ambulation/Gait Ambulation/Gait assistance: Min guard Ambulation Distance (Feet): 6 Feet (performed 3x) Assistive device: Rolling walker (2 wheeled) Gait  Pattern/deviations: Step-to pattern     General Gait Details: Improved confidence and demonstrates improved posture/steps with cueing   Stairs            Wheelchair Mobility    Modified Rankin (Stroke Patients Only)       Balance Overall balance assessment: Needs assistance         Standing balance support: Bilateral upper extremity supported Standing balance-Leahy Scale: Fair                      Cognition Arousal/Alertness: Awake/alert Behavior During Therapy: WFL for tasks assessed/performed Overall Cognitive Status: Within Functional Limits for tasks assessed                      Exercises      General Comments        Pertinent Vitals/Pain Pain Assessment: 0-10 Pain Score: 5  Pain Location: R hip Pain Intervention(s): Monitored during session;Repositioned;Premedicated before session;Limited activity within patient's tolerance    Home Living                      Prior Function            PT Goals (current goals can now be found in the care plan section) Progress towards PT goals: Progressing toward goals    Frequency  BID    PT Plan Current plan remains appropriate    Co-evaluation             End of Session Equipment Utilized During Treatment: Gait belt  Activity Tolerance: Patient tolerated treatment well Patient left: in chair;with chair alarm set;with nursing/sitter in room (OT tending to pt next and with set pt up with table/phone)     Time: [id number] PT Time Calculation (min) (ACUTE ONLY): 24 min  Charges:  $Gait Training: 23-37 mins                    G Codes:      Charlaine Dalton 06/01/2015, 12:21 PM

## 2015-06-01 NOTE — Discharge Planning (Signed)
Pt IV DCd.  DC papers given, explained and educated.  Report called to [facility] s/w April Byrum, LPN.  Packet created and will be sent with EMS.  VSS and RN assessment revealed stability for DC to facility.  Bilat LE wound dressings changed prior to DC.  EMS called and will be transporting to facility (RM 203-B) when they arrive.

## 2015-06-01 NOTE — Progress Notes (Signed)
Patient is medically stable for D/C to [facility] today. Per Kim admissions coordinator at [facility] patient is going to room 203-B. Per Creola Corn authorization has been received. RN will call report at [phone] and arrange EMS for transport. Clinical Education officer, museum (CSW) sent D/C Summary, D/C packet and FL2 to [employer] via [employer]. Patient is aware of above. Per patient she will contact her neighbor and let her know she is discharging today. Please reconsult if future social work needs arise. CSW signing off.   Blima Rich, [place] [phone]

## 2015-06-01 NOTE — NC FL2 (Signed)
[place] LEVEL OF CARE SCREENING TOOL     IDENTIFICATION  Patient Name: [name] Birthdate: 06/26/1956 Sex: female Admission Date (Current Location): 05/28/2015  [place] and [place] Number: Engineering geologist and Address:  [facility], [address], [place], [place] [address]      Provider Number: [id number]  Attending Physician Name and Address:  Hessie Knows, MD  Relative Name and Phone Number:       Current Level of Care: SNF Recommended Level of Care: [place] Prior Approval Number:    Date Approved/Denied:   PASRR Number:  [id number]  Discharge Plan: SNF    Current Diagnoses: Patient Active Problem List   Diagnosis Date Noted  . Pressure ulcer 05/31/2015  . Primary osteoarthritis of right hip 05/28/2015    Orientation ACTIVITIES/SOCIAL BLADDER RESPIRATION    Self, Time, Situation, Place  Family supportive, Active Continent Normal  BEHAVIORAL SYMPTOMS/MOOD NEUROLOGICAL BOWEL NUTRITION STATUS      Continent Diet (Carb modified)  PHYSICIAN VISITS COMMUNICATION OF NEEDS Height & Weight Skin  30 days Verbally   250 lbs. PU Stage and Appropriate Care PU Stage 1 Dressing: Daily PU Stage 2 Dressing: Daily      AMBULATORY STATUS RESPIRATION    Assist extensive Normal      Personal Care Assistance Level of Assistance  Bathing, Dressing Bathing Assistance: Maximum assistance   Dressing Assistance: Maximum assistance      Functional Limitations Info  Sight, Hearing, Speech Sight Info: Adequate Hearing Info: Adequate Speech Info: Adequate       SPECIAL CARE FACTORS FREQUENCY  PT (By licensed PT), OT (By licensed OT)     PT Frequency: 5x weekly OT Frequency: 3x weekly           Additional Factors Info  Insulin Sliding Scale               Current Medications (06/01/2015):  This is the current hospital active medication list Current Facility-Administered Medications   Medication Dose Route Frequency Provider Last Rate Last Dose  . 0.9 %  sodium chloride infusion   Intravenous Continuous Hessie Knows, MD   Stopped at 05/30/15 1519  . acetaminophen (TYLENOL) tablet 650 mg  650 mg Oral Q6H PRN Hessie Knows, MD   650 mg at 05/28/15 2128   Or  . acetaminophen (TYLENOL) suppository 650 mg  650 mg Rectal Q6H PRN Hessie Knows, MD      . alum & mag hydroxide-simeth (MAALOX/MYLANTA) 200-200-20 MG/5ML suspension 30 mL  30 mL Oral Q4H PRN Hessie Knows, MD      . aspirin EC tablet 81 mg  81 mg Oral Daily Hessie Knows, MD   81 mg at 06/01/15 0900  . bisacodyl (DULCOLAX) suppository 10 mg  10 mg Rectal Daily PRN Hessie Knows, MD   10 mg at 05/31/15 0545  . cloNIDine (CATAPRES) tablet 0.3 mg  0.3 mg Oral TID Hessie Knows, MD   0.3 mg at 06/01/15 0859  . diphenhydrAMINE (BENADRYL) 12.5 MG/5ML elixir 12.5-25 mg  12.5-25 mg Oral Q4H PRN Hessie Knows, MD      . docusate sodium (COLACE) capsule 100 mg  100 mg Oral BID Hessie Knows, MD   100 mg at 06/01/15 0900  . enoxaparin (LOVENOX) injection 40 mg  40 mg Subcutaneous Q12H Hessie Knows, MD   40 mg at 06/01/15 0859  . furosemide (LASIX) tablet 40 mg  40 mg Oral Daily Hessie Knows, MD   40 mg  at 06/01/15 0900  . insulin aspart (novoLOG) injection 0-15 Units  0-15 Units Subcutaneous TID WC Duanne Guess, PA-C   3 Units at 06/01/15 0901  . irbesartan (AVAPRO) tablet 75 mg  75 mg Oral Daily Hessie Knows, MD   75 mg at 06/01/15 0900  . levothyroxine (SYNTHROID, LEVOTHROID) tablet 200 mcg  200 mcg Oral QAC breakfast Hessie Knows, MD   200 mcg at 06/01/15 0900  . magnesium citrate solution 1 Bottle  1 Bottle Oral Once PRN Hessie Knows, MD      . magnesium hydroxide (MILK OF MAGNESIA) suspension 30 mL  30 mL Oral Daily PRN Hessie Knows, MD   30 mL at 05/30/15 1639  . menthol-cetylpyridinium (CEPACOL) lozenge 3 mg  1 lozenge Oral PRN Hessie Knows, MD       Or  . phenol [facility]) mouth spray 1 spray  1 spray Mouth/Throat PRN  Hessie Knows, MD      . methocarbamol (ROBAXIN) tablet 500 mg  500 mg Oral Q6H PRN Hessie Knows, MD       Or  . methocarbamol (ROBAXIN) 500 mg in dextrose 5 % 50 mL IVPB  500 mg Intravenous Q6H PRN Hessie Knows, MD      . metoCLOPramide (REGLAN) tablet 5-10 mg  5-10 mg Oral Q8H PRN Hessie Knows, MD       Or  . metoCLOPramide (REGLAN) injection 5-10 mg  5-10 mg Intravenous Q8H PRN Hessie Knows, MD      . metoprolol succinate (TOPROL-XL) 24 hr tablet 25 mg  25 mg Oral Tedra Senegal, MD   25 mg at 06/01/15 0957  . morphine 2 MG/ML injection 2 mg  2 mg Intravenous Q1H PRN Hessie Knows, MD   2 mg at 05/28/15 1706  . ondansetron (ZOFRAN) tablet 4 mg  4 mg Oral Q6H PRN Hessie Knows, MD       Or  . ondansetron [facility]) injection 4 mg  4 mg Intravenous Q6H PRN Hessie Knows, MD      . ondansetron (ZOFRAN-ODT) disintegrating tablet 4 mg  4 mg Oral Q8H PRN Hessie Knows, MD      . oxyCODONE (Oxy IR/ROXICODONE) immediate release tablet 5-10 mg  5-10 mg Oral Q3H PRN Hessie Knows, MD   5 mg at 05/28/15 1645  . oxyCODONE (OXYCONTIN) 12 hr tablet 15 mg  15 mg Oral Q12H Hessie Knows, MD   15 mg at 06/01/15 0900  . tamsulosin (FLOMAX) capsule 0.4 mg  0.4 mg Oral QPC breakfast Hessie Knows, MD   0.4 mg at 06/01/15 0900  . tapentadol (NUCYNTA) tablet 50-100 mg  50-100 mg Oral Q4H PRN Duanne Guess, PA-C   50 mg at 05/31/15 1145  . zolpidem (AMBIEN) tablet 5 mg  5 mg Oral QHS PRN Hessie Knows, MD         Discharge Medications: Please see discharge summary for a list of discharge medications.  Relevant Imaging Results:  Relevant Lab Results:  Recent Labs    Additional Information    Loralyn Freshwater, LCSW

## 2015-06-01 NOTE — Discharge Instructions (Signed)
ANTERIOR APPROACH TOTAL HIP REPLACEMENT POSTOPERATIVE DIRECTIONS   Hip Rehabilitation, Guidelines Following Surgery  The results of a hip operation are greatly improved after range of motion and muscle strengthening exercises. Follow all safety measures which are given to protect your hip. If any of these exercises cause increased pain or swelling in your joint, decrease the amount until you are comfortable again. Then slowly increase the exercises. Call your caregiver if you have problems or questions.   HOME CARE INSTRUCTIONS  Remove items at home which could result in a fall. This includes throw rugs or furniture in walking pathways.   ICE to the affected hip every three hours for 30 minutes at a time and then as needed for pain and swelling.  Continue to use ice on the hip for pain and swelling from surgery. You may notice swelling that will progress down to the foot and ankle.  This is normal after surgery.  Elevate the leg when you are not up walking on it.    Continue to use the breathing machine which will help keep your temperature down.  It is common for your temperature to cycle up and down following surgery, especially at night when you are not up moving around and exerting yourself.  The breathing machine keeps your lungs expanded and your temperature down.  Do not place pillow under knee, focus on keeping the knee straight while resting  DIET You may resume your previous home diet once your are discharged from the hospital.  DRESSING / WOUND CARE / SHOWERING Keep the surgical dressing until follow up.  The dressing is water proof, so you can shower without any extra covering.  IF THE DRESSING FALLS OFF or the wound gets wet inside, change the dressing with sterile gauze.  Please use good hand washing techniques before changing the dressing.  Do not use any lotions or creams on the incision until instructed by your surgeon.   Keep your dressing dry with showering.  You can keep it  covered and pat dry. Change the surgical dressing daily and reapply a dry dressing each time. The wound VAC needs to stay in place for at least 1 week. It can then be removed and normal dressings can be applied.  ACTIVITY Walk with your walker as instructed. Use walker as long as suggested by your caregivers. Avoid periods of inactivity such as sitting longer than an hour when not asleep. This helps prevent blood clots.  You may resume a sexual relationship in one month or when given the OK by your doctor.  You may return to work once you are cleared by your doctor.  Do not drive a car for 6 weeks or until released by you surgeon.  Do not drive while taking narcotics.  WEIGHT BEARING Weight bearing as tolerated with assist device (walker, cane, etc) as directed, use it as long as suggested by your surgeon or therapist, typically at least 4-6 weeks.  POSTOPERATIVE CONSTIPATION PROTOCOL Constipation - defined medically as fewer than three stools per week and severe constipation as less than one stool per week.  One of the most common issues patients have following surgery is constipation.  Even if you have a regular bowel pattern at home, your normal regimen is likely to be disrupted due to multiple reasons following surgery.  Combination of anesthesia, postoperative narcotics, change in appetite and fluid intake all can affect your bowels.  In order to avoid complications following surgery, here are some recommendations in order to  help you during your recovery period.  Colace (docusate) - Pick up an over-the-counter form of Colace or another stool softener and take twice a day as long as you are requiring postoperative pain medications.  Take with a full glass of water daily.  If you experience loose stools or diarrhea, hold the colace until you stool forms back up.  If your symptoms do not get better within 1 week or if they get worse, check with your doctor.  Dulcolax (bisacodyl) - Pick up  over-the-counter and take as directed by the product packaging as needed to assist with the movement of your bowels.  Take with a full glass of water.  Use this product as needed if not relieved by Colace only.   MiraLax (polyethylene glycol) - Pick up over-the-counter to have on hand.  MiraLax is a solution that will increase the amount of water in your bowels to assist with bowel movements.  Take as directed and can mix with a glass of water, juice, soda, coffee, or tea.  Take if you go more than two days without a movement. Do not use MiraLax more than once per day. Call your doctor if you are still constipated or irregular after using this medication for 7 days in a row.  If you continue to have problems with postoperative constipation, please contact the office for further assistance and recommendations.  If you experience "the worst abdominal pain ever" or develop nausea or vomiting, please contact the office immediatly for further recommendations for treatment.  ITCHING  If you experience itching with your medications, try taking only a single pain pill, or even half a pain pill at a time.  You can also use Benadryl over the counter for itching or also to help with sleep.   TED HOSE STOCKINGS Wear the elastic stockings on both legs for three weeks following surgery during the day but you may remove then at night for sleeping.  MEDICATIONS See your medication summary on the After Visit Summary that the nursing staff will review with you prior to discharge.  You may have some home medications which will be placed on hold until you complete the course of blood thinner medication.  It is important for you to complete the blood thinner medication as prescribed by your surgeon.  Continue your approved medications as instructed at time of discharge.  PRECAUTIONS If you experience chest pain or shortness of breath - call 911 immediately for transfer to the hospital emergency department.  If you  develop a fever greater that 101 F, purulent drainage from wound, increased redness or drainage from wound, foul odor from the wound/dressing, or calf pain - CONTACT YOUR SURGEON.                                                   FOLLOW-UP APPOINTMENTS Make sure you keep all of your appointments after your operation with your surgeon and caregivers. You should call the office at the above phone number and make an appointment for approximately two weeks after the date of your surgery or on the date instructed by your surgeon outlined in the "After Visit Summary".  RANGE OF MOTION AND STRENGTHENING EXERCISES  These exercises are designed to help you keep full movement of your hip joint. Follow your caregiver's or physical therapist's instructions. Perform all exercises about fifteen  times, three times per day or as directed. Exercise both hips, even if you have had only one joint replacement. These exercises can be done on a training (exercise) mat, on the floor, on a table or on a bed. Use whatever works the best and is most comfortable for you. Use music or television while you are exercising so that the exercises are a pleasant break in your day. This will make your life better with the exercises acting as a break in routine you can look forward to.  Lying on your back, slowly slide your foot toward your buttocks, raising your knee up off the floor. Then slowly slide your foot back down until your leg is straight again.  Lying on your back spread your legs as far apart as you can without causing discomfort.  Lying on your side, raise your upper leg and foot straight up from the floor as far as is comfortable. Slowly lower the leg and repeat.  Lying on your back, tighten up the muscle in the front of your thigh (quadriceps muscles). You can do this by keeping your leg straight and trying to raise your heel off the floor. This helps strengthen the largest muscle supporting your knee.  Lying on your back,  tighten up the muscles of your buttocks both with the legs straight and with the knee bent at a comfortable angle while keeping your heel on the floor.   IF YOU ARE TRANSFERRED TO A SKILLED REHAB FACILITY If the patient is transferred to a skilled rehab facility following release from the hospital, a list of the current medications will be sent to the facility for the patient to continue.  When discharged from the skilled rehab facility, please have the facility set up the patient's [place] prior to being released. Also, the skilled facility will be responsible for providing the patient with their medications at time of release from the facility to include their pain medication, the muscle relaxants, and their blood thinner medication. If the patient is still at the rehab facility at time of the two week follow up appointment, the skilled rehab facility will also need to assist the patient in arranging follow up appointment in our office and any transportation needs.  MAKE SURE YOU:  Understand these instructions.  Get help right away if you are not doing well or get worse.  Wound vac battery will last 1 week. Once battery dies, alarm will sound, remove wound vac and apply dressing   Pick up stool softner and laxative for home use following surgery while on pain medications. Do not submerge incision under water. Please use good hand washing techniques while changing dressing each day. May shower starting three days after surgery. Please use a clean towel to pat the incision dry following showers. Continue to use ice for pain and swelling after surgery. Do not use any lotions or creams on the incision until instructed by your surgeon.

## 2015-06-01 NOTE — Progress Notes (Signed)
Occupational Therapy Treatment Patient Details Name: [name] MRN: [id number] DOB: April 27, 1956 Today's Date: 06/01/2015    History of present illness Pt here with L hip replacement, anterior approach   OT comments  Patient with improvement with hip kit use.  Follow Up Recommendations  SNF    Equipment Recommendations       Recommendations for Other Services      Precautions / Restrictions Precautions Precautions: Anterior Hip;Fall Restrictions Weight Bearing Restrictions: Yes LLE Weight Bearing: Weight bearing as tolerated       Mobility Bed Mobility                  Transfers                      Balance                                   ADL  Patient declined actual dressing, but willing to practice techniques for lower body dressing using hip kit (can't reach right foot enough yet). Patient used reacher to doff sock and techniques for donning pants with minimal assist and verbal and physical cues for safety and technique.                                              Vision                     Perception     Praxis      Cognition   Behavior During Therapy: WFL for tasks assessed/performed Overall Cognitive Status: Within Functional Limits for tasks assessed                       Extremity/Trunk Assessment               Exercises     Shoulder Instructions       General Comments      Pertinent Vitals/ Pain       Pain Assessment: 0-10 Pain Score: 5  Pain Location: R hip Pain Intervention(s): Monitored during session;Repositioned;Premedicated before session;Limited activity within patient's tolerance  Home Living                                          Prior Functioning/Environment              Frequency       Progress Toward Goals  OT Goals(current goals can now be found in the care plan section)        Plan      Co-evaluation                 End of Session Equipment Utilized During Treatment:  (hip kit)   Activity Tolerance     Patient Left in chair;with call bell/phone within reach;with chair alarm set   Nurse Communication          Time: 4196-2229 OT Time Calculation (min): 14 min  Charges: OT General Charges $OT Visit: 1 Procedure OT Treatments $Self Care/Home Management : 8-22 mins  Myrene Galas, MS/OTR/L  06/01/2015, 11:45 AM

## 2015-06-01 NOTE — Clinical Social Work Placement (Signed)
   CLINICAL SOCIAL WORK PLACEMENT  NOTE  Date:  06/01/2015  Patient Details  Name: [name] MRN: [id number] Date of Birth: 1956-01-03  Clinical Social Work is seeking post-discharge placement for this patient at the [place] level of care (*CSW will initial, date and re-position this form in  chart as items are completed):  Yes   Patient/family provided with [facility]'s list of facilities offering this level of care within the geographic area requested by the patient (or if unable, by the patient's family).  Yes   Patient/family informed of their freedom to choose among providers that offer the needed level of care, that participate in Medicare, Medicaid or managed care program needed by the patient, have an available bed and are willing to accept the patient.  Yes   Patient/family informed of [facility]'s ownership interest in [facility] and [facility], as well as of the fact that they are under no obligation to receive care at these facilities.  PASRR submitted to EDS on 05/31/15     PASRR number received on 05/31/15     Existing PASRR number confirmed on       FL2 transmitted to all facilities in geographic area requested by pt/family on 05/29/15     FL2 transmitted to all facilities within larger geographic area on       Patient informed that his/her managed care company has contracts with or will negotiate with certain facilities, including the following:        Yes   Patient/family informed of bed offers received.  Patient chooses bed at [facility]     Physician recommends and patient chooses bed at      Patient to be transferred to [facility] on 06/01/15.  Patient to be transferred to facility by  [facility] )     Patient family notified on 06/01/15 of transfer.  Name of family member notified:   (Patient reported that she would notify her neighbor of D/C today. Patient is alert and  oriented. )     PHYSICIAN       Additional Comment:    _______________________________________________ Loralyn Freshwater, LCSW 06/01/2015, 1:14 PM

## 2015-06-02 DIAGNOSIS — Z794 Long term (current) use of insulin: Secondary | ICD-10-CM | POA: Diagnosis not present

## 2015-06-02 DIAGNOSIS — E119 Type 2 diabetes mellitus without complications: Secondary | ICD-10-CM | POA: Diagnosis present

## 2015-06-02 LAB — GLUCOSE, CAPILLARY
GLUCOSE-CAPILLARY: 227 mg/dL — AB (ref 65–99)
GLUCOSE-CAPILLARY: 266 mg/dL — AB (ref 65–99)
GLUCOSE-CAPILLARY: 192 mg/dL — AB (ref 65–99)
Glucose-Capillary: 178 mg/dL — ABNORMAL HIGH (ref 65–99)

## 2015-06-03 DIAGNOSIS — E119 Type 2 diabetes mellitus without complications: Secondary | ICD-10-CM | POA: Diagnosis not present

## 2015-06-03 NOTE — Progress Notes (Signed)
KHAIA, SEATON ([id number]) Visit Report for 05/27/2015 Arrival Information Details Patient Name: [name], [name]. Date of Service: 05/27/2015 2:30 PM Medical Record Number: [id number] Patient Account Number: [id number] Date of Birth/Sex: January 21, 1956 (59 y.o. Female) Treating RN: Ahmed Prima Primary Care Physician: [name] Other Clinician: Referring Physician: [name] Treating Physician/Extender: BURNS III, Charlean Sanfilippo in Treatment: 69 Visit Information History Since Last Visit All ordered tests and consults were completed: No Patient Arrived: Wheel Chair Added or deleted any medications: No Arrival Time: 14:37 Any new allergies or adverse reactions: No Accompanied By: self Had a fall or experienced change in No Transfer Assistance: EasyPivot Patient activities of daily living that may affect Lift risk of falls: Patient Identification Verified: Yes Signs or symptoms of abuse/neglect since last No Secondary Verification Process Yes visito Completed: Hospitalized since last visit: No Patient Requires Transmission- No Pain Present Now: No Based Precautions: Patient Has Alerts: Yes Patient Alerts: Patient on Blood Thinner Electronic Signature(s) Signed: 06/02/2015 6:00:56 PM By: Alric Quan Entered By: Alric Quan on 05/27/2015 14:38:16 [name] ([id number]) -------------------------------------------------------------------------------- Clinic Level of Care Assessment Details Patient Name: [name] Date of Service: 05/27/2015 2:30 PM Medical Record Number: [id number] Patient Account Number: [id number] Date of Birth/Sex: 20-May-1956 (59 y.o. Female) Treating RN: Ahmed Prima Primary Care Physician: [name] Other Clinician: Referring Physician: [name] Treating Physician/Extender: BURNS III, Charlean Sanfilippo in Treatment: 113 Clinic Level of Care Assessment Items TOOL 4 Quantity Score []  - Use when only an  EandM is performed on FOLLOW-UP visit 0 ASSESSMENTS - Nursing Assessment / Reassessment []  - Reassessment of Co-morbidities (includes updates in patient status) 0 X - Reassessment of Adherence to Treatment Plan 1 5 ASSESSMENTS - Wound and Skin Assessment / Reassessment []  - Simple Wound Assessment / Reassessment - one wound 0 X - Complex Wound Assessment / Reassessment - multiple wounds 1 5 []  - Dermatologic / Skin Assessment (not related to wound area) 0 ASSESSMENTS - Focused Assessment X - Circumferential Edema Measurements - multi extremities 1 5 []  - Nutritional Assessment / Counseling / Intervention 0 []  - Lower Extremity Assessment (monofilament, tuning fork, pulses) 0 []  - Peripheral Arterial Disease Assessment (using hand held doppler) 0 ASSESSMENTS - Ostomy and/or Continence Assessment and Care []  - Incontinence Assessment and Management 0 []  - Ostomy Care Assessment and Management (repouching, etc.) 0 PROCESS - Coordination of Care X - Simple Patient / Family Education for ongoing care 1 15 []  - Complex (extensive) Patient / Family Education for ongoing care 0 []  - Staff obtains Programmer, systems, Records, Test Results / Process Orders 0 []  - Staff telephones HHA, Nursing Homes / Clarify orders / etc 0 []  - Routine Transfer to another Facility (non-emergent condition) 0 Perales, Yasmin K. ([id number]) []  - Routine Hospital Admission (non-emergent condition) 0 []  - New Admissions / Biomedical engineer / Ordering NPWT, Apligraf, etc. 0 []  - Emergency Hospital Admission (emergent condition) 0 X - Simple Discharge Coordination 1 10 []  - Complex (extensive) Discharge Coordination 0 PROCESS - Special Needs []  - Pediatric / Minor Patient Management 0 []  - Isolation Patient Management 0 []  - Hearing / Language / Visual special needs 0 []  - Assessment of Community assistance (transportation, D/C planning, etc.) 0 []  - Additional assistance / Altered mentation 0 []  - Support Surface(s)  Assessment (bed, cushion, seat, etc.) 0 INTERVENTIONS - Wound Cleansing / Measurement []  - Simple Wound Cleansing - one wound 0 X - Complex Wound Cleansing - multiple wounds 1 5 X -  Wound Imaging (photographs - any number of wounds) 1 5 []  - Wound Tracing (instead of photographs) 0 []  - Simple Wound Measurement - one wound 0 X - Complex Wound Measurement - multiple wounds 1 5 INTERVENTIONS - Wound Dressings []  - Small Wound Dressing one or multiple wounds 0 []  - Medium Wound Dressing one or multiple wounds 0 X - Large Wound Dressing one or multiple wounds 1 20 X - Application of Medications - topical 1 5 []  - Application of Medications - injection 0 INTERVENTIONS - Miscellaneous []  - External ear exam 0 Scarfo, Benedicta K. ([id number]) []  - Specimen Collection (cultures, biopsies, blood, body fluids, etc.) 0 []  - Specimen(s) / Culture(s) sent or taken to Lab for analysis 0 []  - Patient Transfer (multiple staff / Harrel Lemon Lift / Similar devices) 0 []  - Simple Staple / Suture removal (25 or less) 0 []  - Complex Staple / Suture removal (26 or more) 0 []  - Hypo / Hyperglycemic Management (close monitor of Blood Glucose) 0 []  - Ankle / Brachial Index (ABI) - do not check if billed separately 0 X - Vital Signs 1 5 Has the patient been seen at the hospital within the last three years: Yes Total Score: 85 Level Of Care: New/Established - Level 3 Electronic Signature(s) Signed: 06/02/2015 6:00:56 PM By: Alric Quan Entered By: Alric Quan on 05/27/2015 15:38:54 [name] ([id number]) -------------------------------------------------------------------------------- Encounter Discharge Information Details Patient Name: [name]. Date of Service: 05/27/2015 2:30 PM Medical Record Number: [id number] Patient Account Number: [id number] Date of Birth/Sex: 05-05-1956 (59 y.o. Female) Treating RN: Cornell Barman Primary Care Physician: [name] Other Clinician: Referring  Physician: [name] Treating Physician/Extender: BURNS III, Charlean Sanfilippo in Treatment: 113 Encounter Discharge Information Items Discharge Pain Level: 0 Discharge Condition: Stable Ambulatory Status: Wheelchair Discharge Destination: Home Transportation: Private Auto Accompanied By: self Schedule Follow-up Appointment: Yes Medication Reconciliation completed and provided to Patient/Care Yes Lonnie Reth: Provided on Clinical Summary of Care: 05/27/2015 Form Type Recipient Paper Patient GI Electronic Signature(s) Signed: 06/02/2015 6:00:56 PM By: Alric Quan Previous Signature: 05/27/2015 3:33:51 PM Version By: Ruthine Dose Entered By: Alric Quan on 05/27/2015 15:40:45 [name] ([id number]) -------------------------------------------------------------------------------- Lower Extremity Assessment Details Patient Name: [name] Date of Service: 05/27/2015 2:30 PM Medical Record Number: [id number] Patient Account Number: [id number] Date of Birth/Sex: 1955-10-23 (59 y.o. Female) Treating RN: Carolyne Fiscal, Debi Primary Care Physician: [name] Other Clinician: Referring Physician: [name] Treating Physician/Extender: BURNS III, Charlean Sanfilippo in Treatment: 113 Edema Assessment Assessed: [Left: No] [Right: No] Edema: [Left: Yes] [Right: Yes] Calf Left: Right: Point of Measurement: 24 cm From Medial Instep 47 cm 42.8 cm Ankle Left: Right: Point of Measurement: 8 cm From Medial Instep 25 cm 25.6 cm Vascular Assessment Pulses: Posterior Tibial Dorsalis Pedis Palpable: [Left:Yes] [Right:Yes] Extremity colors, hair growth, and conditions: Extremity Color: [Left:Hyperpigmented] [Right:Hyperpigmented] Hair Growth on Extremity: [Left:Yes] [Right:Yes] Temperature of Extremity: [Left:Warm] [Right:Warm] Capillary Refill: [Left:< 3 seconds] [Right:< 3 seconds] Toe Nail Assessment Left: Right: Thick: Yes Yes Discolored: Yes Yes Deformed: No  No Improper Length and Hygiene: No No Electronic Signature(s) Signed: 06/02/2015 6:00:56 PM By: Alric Quan Entered By: Alric Quan on 05/27/2015 14:48:56 [name] ([id number]) -------------------------------------------------------------------------------- Multi Wound Chart Details Patient Name: [name]. Date of Service: 05/27/2015 2:30 PM Medical Record Number: [id number] Patient Account Number: [id number] Date of Birth/Sex: 10/11/1955 (59 y.o. 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] [58:Lymphedema] [N/A:N/A] Secondary Etiology: [56:N/A] [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] [N/A:N/A] Comorbid History: [56:Lymphedema, Hypertension, Type II Diabetes] [58:Lymphedema, Hypertension, Type II Diabetes] [N/A:N/A] Date Acquired: [56:05/04/2015] [58:05/15/2015] [N/A:N/A] Weeks of Treatment: [56:2] [58:1] [N/A:N/A] Wound Status: [56:Open] [58:Open] [N/A:N/A] Clustered Wound: [56:Yes] [58:Yes] [N/A:N/A] Measurements L x W x D 13.3x30x0.1 [58:14x29x0.1] [N/A:N/A] (cm) Area (cm) : [id number] [N/A:N/A] Volume (cm) : [56:31.337] [58:31.887] [N/A:N/A] % Reduction in Area: [56:-2332.80%] [58:-238.30%] [N/A:N/A] % Reduction in Volume: -2333.00% [58:-238.30%] [N/A:N/A] Classification: [56:Grade 1] [58:Partial Thickness] [N/A:N/A] HBO  Classification: [56:N/A] [58:Grade 1] [N/A:N/A] Exudate Amount: [56:Large] [58:Large] [N/A:N/A] Exudate Type: [56:Serous] [58:Serous] [N/A:N/A] Exudate Color: [56:amber] [58:amber] [N/A:N/A] Foul Odor After [56:Yes] [58:No] [N/A:N/A] Cleansing: Odor Anticipated Due to No [58:N/A] [N/A:N/A] Product UseELISEA, DELCOUR ([id number]) Wound Margin: N/A Indistinct, nonvisible N/A Granulation Amount: Medium (34-66%) Medium (34-66%) N/A Granulation Quality: Red, Pink Red, Pink N/A Necrotic Amount: Medium (34-66%) Medium (34-66%) N/A Necrotic Tissue: Eschar, Adherent Slough Adherent Slough N/A Exposed Structures: Fascia: No Fascia: No N/A Fat: No Fat: No Tendon: No Tendon: No Muscle: No Muscle: No Joint: No Joint: No Bone: No Bone: No Limited to Skin Limited to Skin Breakdown Breakdown Epithelialization: None None N/A Periwound Skin Texture: No Abnormalities Noted Edema: Yes N/A Scarring: Yes Excoriation: No Induration: No Callus: No Crepitus: No Fluctuance: No Friable: No Rash: No Periwound Skin Maceration: Yes Maceration: Yes N/A Moisture: Moist: Yes Moist: Yes Dry/Scaly: No Periwound Skin Color: No Abnormalities Noted Atrophie Blanche: No N/A Cyanosis: No Ecchymosis: No Erythema: No Hemosiderin Staining: No Mottled: No Pallor: No Rubor: No Tenderness on No No N/A Palpation: Wound Preparation: Ulcer Cleansing: Other: Ulcer Cleansing: Other: N/A soap and water soap and water Topical Anesthetic Topical Anesthetic Applied: Other: lidocaine Applied: None 4% Treatment Notes Electronic Signature(s) Signed: 06/02/2015 6:00:56 PM By: Alric Quan Entered By: Alric Quan on 05/27/2015 15:36:59 [name] ([name] ([id number]) -------------------------------------------------------------------------------- Multi-Disciplinary Care Plan Details Patient Name: [name] Date of Service: 05/27/2015 2:30 PM Medical  Record Number: [id number] Patient Account Number: [id number] Date of Birth/Sex: 24-Sep-1955 (59 y.o. Female) Treating RN: Ahmed Prima Primary Care Physician: [name] Other Clinician: Referring Physician: [name] Treating Physician/Extender: BURNS III, Charlean Sanfilippo in Treatment: 113 Active Inactive Nutrition Nursing Diagnoses: Impaired glucose control: actual or potential Goals: Patient/caregiver verbalizes understanding of need to maintain therapeutic glucose control per primary care physician Date Initiated: 12/16/2013 Goal Status: Active Interventions: Provide education on elevated blood sugars and impact on wound healing Notes: Orientation to the Wound Care Program Nursing Diagnoses: Knowledge deficit related to the wound healing center program Goals: Patient/caregiver will verbalize understanding of the [facility] Date Initiated: 04/03/2013 Goal Status: Active Interventions: Provide education on orientation to the wound center Notes: Soft Tissue Infection Nursing Diagnoses: Impaired tissue integrity Goals: Patient will remain free of wound infection DARRIONA, CONNELLY ([id number]) Date Initiated: 12/16/2013 Goal Status: Active Interventions: Assess signs and symptoms of infection every visit Notes: Venous Leg Ulcer Nursing Diagnoses: Knowledge deficit related to disease process and management Potential for venous Insuffiency (use before diagnosis confirmed) Goals: Patient will maintain optimal edema control Date Initiated: 08/18/2014 Goal Status: Active Interventions: Assess peripheral  edema status every visit. Compression as ordered Treatment Activities: Therapeutic compression applied : 05/27/2015 Notes: Electronic Signature(s) Signed: 06/02/2015 6:00:56 PM By: Alric Quan Entered By: Alric Quan on 05/27/2015 15:36:47 [name]  ([id number]) -------------------------------------------------------------------------------- Pain Assessment Details Patient Name: [name]. Date of Service: 05/27/2015 2:30 PM Medical Record Number: [id number] Patient Account Number: [id number] Date of Birth/Sex: 05/31/1956 (59 y.o. Female) Treating RN: Ahmed Prima Primary Care Physician: [name] Other Clinician: Referring Physician: [name] Treating Physician/Extender: BURNS III, Charlean Sanfilippo in Treatment: 113 Active Problems Location of Pain Severity and Description of Pain Patient Has Paino No Site Locations Pain Management and Medication Current Pain Management: Electronic Signature(s) Signed: 06/02/2015 6:00:56 PM By: Alric Quan Entered By: Alric Quan on 05/27/2015 14:40:50 [name] ([id number]) -------------------------------------------------------------------------------- Patient/Caregiver Education Details Patient Name: [name]. Date of Service: 05/27/2015 2:30 PM Medical Record Number: [id number] Patient Account Number: [id number] Date of Birth/Gender: [birth year] (59 y.o. Female) Treating RN: Ahmed Prima Primary Care Physician: [name] Other Clinician: Referring Physician: [name] Treating Physician/Extender: BURNS III, Charlean Sanfilippo in Treatment: 43 Education Assessment Education Provided To: Patient Education Topics Provided Wound/Skin Impairment: Handouts: Other: change dressings as ordered Methods: Demonstration, Explain/Verbal Responses: State content correctly Electronic Signature(s) Signed: 06/02/2015 6:00:56 PM By: Alric Quan Entered By: Alric Quan on 05/27/2015 15:41:19 [name] ([id number]) -------------------------------------------------------------------------------- Wound Assessment Details Patient Name: [name] Date of Service: 05/27/2015 2:30 PM Medical Record Number: [id number] Patient  Account Number: [id number] Date of Birth/Sex: [birth year] (59 y.o. Female) Treating RN: Carolyne Fiscal, Debi Primary Care Physician: [name] Other Clinician: Referring Physician: [name] Treating Physician/Extender: BURNS III, WALTER Weeks in Treatment: 113 Wound Status Wound Number: 56 Primary Diabetic Wound/Ulcer of the Lower Etiology: Extremity Wound Location: Left Lower Leg - Medial Wound Status: Open Wounding Event: Gradually Appeared Comorbid Lymphedema, Hypertension, Type II Date Acquired: 05/04/2015 History: Diabetes Weeks Of Treatment: 2 Clustered Wound: Yes Photos Wound Measurements Length: (cm) 13.3 Width: (cm) 30 Depth: (cm) 0.1 Area: (cm) 313.374 Volume: (cm) 31.337 % Reduction in Area: -2332.8% % Reduction in Volume: -2333% Epithelialization: None Tunneling: No Undermining: No Wound Description Classification: Grade 1 Exudate Amount: Large Exudate Type: Serous Exudate Color: amber Foul Odor After Cleansing: Yes Due to Product Use: No Wound Bed Granulation Amount: Medium (34-66%) Exposed Structure Granulation Quality: Red, Pink Fascia Exposed: No Necrotic Amount: Medium (34-66%) Fat Layer Exposed: No Necrotic Quality: Eschar, Adherent Slough Tendon Exposed: No Muscle Exposed: No Joint Exposed: No Czerniak, Arrie K. ([id number]) Bone Exposed: No Limited to Skin Breakdown Periwound Skin Texture Texture Color No Abnormalities Noted: No No Abnormalities Noted: No Moisture No Abnormalities Noted: No Maceration: Yes Moist: Yes Wound Preparation Ulcer Cleansing: Other: soap and water, Topical Anesthetic Applied: Other: lidocaine 4%, Treatment Notes Wound #56 (Left, Medial Lower Leg) 1. Cleansed with: Cleanse wound with antibacterial soap and water 2. Anesthetic Topical Lidocaine 4% cream to wound bed prior to debridement 4. Dressing Applied: Aquacel Ag 5. Secondary Dressing Applied ABD Pad Kerlix/Conform 6. Footwear/Offloading  device applied Other footwear/offloading device applied (specify in notes) 7. Secured with Tape Notes juxtilites Electronic Signature(s) Signed: 06/02/2015 6:00:56 PM By: Alric Quan Entered By: Alric Quan on 05/27/2015 16:49:59 [name] ([id number]) -------------------------------------------------------------------------------- Wound Assessment Details Patient Name: [name]. Date of Service: 05/27/2015 2:30 PM Medical Record Number: [id number] Patient Account Number: [id number] Date of Birth/Sex: 02/26/1956 (59 y.o. Female) Treating RN: Ahmed Prima Primary Care Physician: [name] Other Clinician: Referring Physician: [name] Treating Physician/Extender: BURNS  III, WALTER Weeks in Treatment: 113 Wound Status Wound Number: 58 Primary Lymphedema Etiology: Wound Location: Right Lower Leg - Circumfernential Secondary Diabetic Wound/Ulcer of the Lower Etiology: Extremity Wounding Event: Blister Wound Status: Open Date Acquired: 05/15/2015 Comorbid Lymphedema, Hypertension, Type Weeks Of Treatment: 1 History: II Diabetes Clustered Wound: Yes Photos Wound Measurements Length: (cm) 14 Width: (cm) 29 Depth: (cm) 0.1 Area: (cm) 318.872 Volume: (cm) 31.887 % Reduction in Area: -238.3% % Reduction in Volume: -238.3% Epithelialization: None Tunneling: No Undermining: No Wound Description Classification: Partial Thickness Diabetic Severity (Wagner): Grade 1 Wound Margin: Indistinct, nonvisibl Exudate Amount: Large Exudate Type: Serous Exudate Color: amber Foul Odor After Cleansing: No e Wound Bed Granulation Amount: Medium (34-66%) Exposed Structure Granulation Quality: Red, Pink Fascia Exposed: No Necrotic Amount: Medium (34-66%) Fat Layer Exposed: No Balaguer, Tranika K. ([id number]) Necrotic Quality: Adherent Slough Tendon Exposed: No Muscle Exposed: No Joint Exposed: No Bone Exposed: No Limited to Skin  Breakdown Periwound Skin Texture Texture Color No Abnormalities Noted: No No Abnormalities Noted: No Callus: No Atrophie Blanche: No Crepitus: No Cyanosis: No Excoriation: No Ecchymosis: No Fluctuance: No Erythema: No Friable: No Hemosiderin Staining: No Induration: No Mottled: No Localized Edema: Yes Pallor: No Rash: No Rubor: No Scarring: Yes Moisture No Abnormalities Noted: No Dry / Scaly: No Maceration: Yes Moist: Yes Wound Preparation Ulcer Cleansing: Other: soap and water, Topical Anesthetic Applied: None Treatment Notes Wound #58 (Right, Circumferential Lower Leg) 1. Cleansed with: Cleanse wound with antibacterial soap and water 2. Anesthetic Topical Lidocaine 4% cream to wound bed prior to debridement 4. Dressing Applied: Aquacel Ag 5. Secondary Dressing Applied ABD Pad Kerlix/Conform 6. Footwear/Offloading device applied Other footwear/offloading device applied (specify in notes) 7. Secured with Tape Notes juxtilites MOESHA, KUENNING ([id number]) Electronic Signature(s) Signed: 06/02/2015 6:00:56 PM By: Alric Quan Entered By: Alric Quan on 05/27/2015 16:50:57 [name] ([id number]) -------------------------------------------------------------------------------- Vitals Details Patient Name: [name] Date of Service: 05/27/2015 2:30 PM Medical Record Number: [id number] Patient Account Number: [id number] Date of Birth/Sex: 1956/03/07 (59 y.o. Female) Treating RN: Carolyne Fiscal, Debi Primary Care Physician: [name] Other Clinician: Referring Physician: [name] Treating Physician/Extender: BURNS III, WALTER Weeks in Treatment: 113 Vital Signs Time Taken: 14:42 Temperature (F): 98.0 Height (in): 65 Pulse (bpm): 61 Respiratory Rate (breaths/min): 18 Blood Pressure (mmHg): 162/56 Reference Range: 80 - 120 mg / dl Electronic Signature(s) Signed: 06/02/2015 6:00:56 PM By: Alric Quan Entered By:  Alric Quan on 05/27/2015 14:43:31

## 2015-06-03 NOTE — Progress Notes (Signed)
LYNNE, VONCANNON ([id number]) Visit Report for 05/27/2015 Chief Complaint Document Details Patient Name: [name], [name]. Date of Service: 05/27/2015 2:30 PM Medical Record Number: [id number] Patient Account Number: [id number] Date of Birth/Sex: 04-01-1956 (59 y.o. 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 Electronic Signature(s) Signed: 05/27/2015 4:02:10 PM By: Loletha Grayer MD Entered By: Loletha Grayer on 05/27/2015 15:28:25 [name] ([id number]) -------------------------------------------------------------------------------- HPI Details Patient Name: [name] Date of Service: 05/27/2015 2:30 PM Medical Record Number: [id number] Patient Account Number: [id number] Date of Birth/Sex: March 04, 1956 (59 y.o. Female) Treating RN: Cornell Barman Primary Care Physician: [name] Other Clinician: Referring Physician: [name] Treating Physician/Extender: BURNS III, Charlean Sanfilippo in Treatment: 65 History of Present Illness HPI Description: 59yo w/ h/o BLE phlebolymphedema, type 2 DM (unknown hemoglobin A1c), and obesity. No PVD. h/o chronic, recurrent BLE calf ulcers. Treated with 4 layer compression. Infrequently uses lymphedema pump. Bilateral lower extremity ulcerations healed as of June 2016. Fitted for custom compression stockings but did not receive them. Patient could not travel for lymphedema consult. She developed recurrent bilateral lower extremity ulcerations in August 2016. She is able to heal these quite quickly with 4 layer elastic compression bandages. However, she is not very compliant using her juxtalite compression garments and tends to develop recurrent  ulcerations while using juxtalite. Unna boots were applied this past week. She tolerated this well but performs minimal ambulation. She is scheduled to undergo total hip replacement tomorrow with Dr. Rudene Christians. She is unsure if he is aware of her ulcerations. She is otherwise without complaints. No significant pain. No fever or chills. Moderate clear drainage. Not on any antibiotics. Electronic Signature(s) Signed: 05/27/2015 4:02:10 PM By: Loletha Grayer MD Entered By: Loletha Grayer on 05/27/2015 15:33:35 [name] ([id number]) -------------------------------------------------------------------------------- Physical Exam Details Patient Name: [name] Date of Service: 05/27/2015 2:30 PM Medical Record Number: [id number] Patient Account Number: [id number] Date of Birth/Sex: 1955/12/29 (59 y.o. Female) Treating RN: Cornell Barman Primary Care Physician: [name] Other Clinician: Referring Physician: [name] Treating Physician/Extender: BURNS III, WALTER Weeks in Treatment: 113 Constitutional . Pulse regular. Respirations normal and unlabored. Afebrile. Marland Kitchen Respiratory WNL. No retractions.. Cardiovascular Pedal Pulses WNL. Integumentary (Hair, Skin) .Marland Kitchen Neurological Sensation normal to touch, pin,and vibration. Psychiatric . Oriented times 3.. No evidence of depression, anxiety, or agitation.. Notes Bilateral calf ulcerations. Partial to full-thickness. Biofilm wiped free with gauze. Healthy underlying, bleeding granulation tissue and dermis. No surgical debridement required. Baseline erythema. No significant cellulitis. 2+ pitting edema. Palpable DP bilaterally. ABIs abnormally elevated. Electronic Signature(s) Signed: 05/27/2015 4:02:10 PM By: Loletha Grayer MD Entered By: Loletha Grayer on 05/27/2015 15:34:54 [name] ([id number]) -------------------------------------------------------------------------------- Physician Orders  Details Patient Name: [name] Date of Service: 05/27/2015 2:30 PM Medical Record Number: [id number] Patient Account Number: [id number] Date of Birth/Sex: 07/18/1955 (59 y.o. Female) Treating RN: Ahmed Prima Primary Care Physician: [name] Other Clinician: Referring Physician: [name] Treating Physician/Extender: BURNS III, Charlean Sanfilippo in Treatment: 113 Verbal / Phone Orders: Yes Clinician: Carolyne Fiscal, Debi Read Back and Verified: Yes Diagnosis Coding ICD-9 Coding Code Description 457.1 Other Lymphedema - excludes congenital, eyelid or vulva 459.33 Chronic venous hypertension with ulcer and inflammation 707.12 Ulcer of lower limbs, except pressure ulcer; Ulcer of calf ICD-10 Coding Code Description I89.0  Lymphedema, not elsewhere classified E66.9 Obesity, unspecified E11.40 Type 2 diabetes mellitus with diabetic neuropathy, unspecified R26.89 Other abnormalities of gait and mobility 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 I83.222 Varicose veins of left lower extremity with both ulcer of calf and inflammation E11.622 Type 2 diabetes mellitus with other skin ulcer L97.221 Non-pressure chronic ulcer of left calf limited to breakdown of skin Wound Cleansing Wound #56 Left,Circumferential Lower Leg o Cleanse wound with mild soap and water Wound #56 Left,Medial Lower Leg o Cleanse wound with mild soap and water Wound #58 Right,Circumferential Lower Leg o Cleanse wound with mild soap and water Primary Wound Dressing Wound #56 Left,Circumferential Lower Leg o Aquacel Ag Wound #56 Left,Medial Lower Leg o Aquacel Ag Wound #58 Right,Circumferential Lower Leg [name], [name] ([id number]) o Aquacel Ag Secondary Dressing Wound #56 Left,Circumferential Lower Leg o ABD and Kerlix/Conform Wound #56 Left,Medial Lower Leg o ABD and Kerlix/Conform Wound #58 Right,Circumferential Lower Leg o  ABD and Kerlix/Conform Dressing Change Frequency Wound #56 Left,Circumferential Lower Leg o Change dressing every other day. Wound #56 Left,Medial Lower Leg o Change dressing every other day. Wound #58 Right,Circumferential Lower Leg o Change dressing every other day. Edema Control Wound #56 Left,Circumferential Lower Leg o Patient to wear own Juxtalite/Juzo compression garment. Wound #56 Left,Medial Lower Leg o Patient to wear own Juxtalite/Juzo compression garment. Wound #58 Right,Circumferential Lower Leg o Patient to wear own Juxtalite/Juzo compression garment. Electronic Signature(s) Signed: 05/27/2015 4:02:10 PM By: Loletha Grayer MD Signed: 06/02/2015 6:00:56 PM By: Alric Quan Entered By: Alric Quan on 05/27/2015 15:38:14 [name] ([id number]) -------------------------------------------------------------------------------- Problem List Details Patient Name: [name] Date of Service: 05/27/2015 2:30 PM Medical Record Number: [id number] Patient Account Number: [id number] Date of Birth/Sex: [birth year] (59 y.o. Female) Treating RN: Cornell Barman Primary Care Physician: [name] Other Clinician: Referring Physician: [name] Treating Physician/Extender: BURNS III, Charlean Sanfilippo in Treatment: 113 Active Problems ICD-9 Encounter Code Description Active Date Diagnosis 457.1 Other Lymphedema - excludes congenital, eyelid or vulva 03/27/2013 Yes 459.33 Chronic venous hypertension with ulcer and inflammation 03/27/2013 Yes 707.12 Ulcer of lower limbs, except pressure ulcer; Ulcer of calf 03/27/2013 Yes ICD-10 Encounter Code Description Active Date Diagnosis I89.0 Lymphedema, not elsewhere classified 03/26/2014 Yes E66.9 Obesity, unspecified 03/26/2014 Yes E11.40 Type 2 diabetes mellitus with diabetic neuropathy, 01/14/2015 Yes unspecified R26.89 Other abnormalities of gait and mobility 01/14/2015 Yes I83.212 Varicose veins of  right lower extremity with both ulcer of 02/25/2015 Yes calf and inflammation I83.222 Varicose veins of left lower extremity with both ulcer of 11/12/2014 Yes calf and inflammation [name], [name] ([id number]) E11.622 Type 2 diabetes mellitus with other skin ulcer 02/25/2015 Yes L97.221 Non-pressure chronic ulcer of left calf limited to 05/12/2015 Yes breakdown of skin Inactive Problems Resolved Problems ICD-10 Code Description Active Date Resolved Date E11.621 Type 2 diabetes mellitus with foot ulcer 10/08/2014 10/09/2014 L03.115 Cellulitis of right lower limb 02/25/2015 02/25/2015 L84 Corns and callosities 11/12/2014 11/12/2014 Electronic Signature(s) Signed: 05/27/2015 4:02:10 PM By: Loletha Grayer MD Entered By: Loletha Grayer on 05/27/2015 15:28:04 [name] ([id number]) -------------------------------------------------------------------------------- Progress Note Details Patient Name: [name]. Date of Service: 05/27/2015 2:30 PM Medical Record Number: [id number] Patient Account Number: [id number] Date of Birth/Sex: June 15, 1956 (59 y.o. Female) Treating RN: Cornell Barman Primary Care Physician: [name] Other Clinician: Referring Physician: [name] Treating Physician/Extender: BURNS III, Charlean Sanfilippo in Treatment: 113 Subjective Chief Complaint Information obtained from Patient Bilateral lower extremity  phlebolymphedema and recurrent calf ulcerations. History of Present Illness (HPI) 59yo w/ h/o BLE phlebolymphedema, type 2 DM (unknown hemoglobin A1c), and obesity. No PVD. h/o chronic, recurrent BLE calf ulcers. Treated with 4 layer compression. Infrequently uses lymphedema pump. Bilateral lower extremity ulcerations healed as of June 2016. Fitted for custom compression stockings but did not receive them. Patient could not travel for lymphedema consult. She developed recurrent bilateral lower extremity ulcerations in August 2016. She is able to  heal these quite quickly with 4 layer elastic compression bandages. However, she is not very compliant using her juxtalite compression garments and tends to develop recurrent ulcerations while using juxtalite. Unna boots were applied this past week. She tolerated this well but performs minimal ambulation. She is scheduled to undergo total hip replacement tomorrow with Dr. Rudene Christians. She is unsure if he is aware of her ulcerations. She is otherwise without complaints. No significant pain. No fever or chills. Moderate clear drainage. Not on any antibiotics. Objective Constitutional Pulse regular. Respirations normal and unlabored. Afebrile. Vitals Time Taken: 2:42 PM, Height: 65 in, Temperature: 98.0 F, Pulse: 61 bpm, Respiratory Rate: 18 breaths/min, Blood Pressure: 162/56 mmHg. Respiratory WNL. No retractions.. Cardiovascular Abel, Bernyce K. ([id number]) Pedal Pulses WNL. Neurological Sensation normal to touch, pin,and vibration. Psychiatric Oriented times 3.. No evidence of depression, anxiety, or agitation.. General Notes: Bilateral calf ulcerations. Partial to full-thickness. Biofilm wiped free with gauze. Healthy underlying, bleeding granulation tissue and dermis. No surgical debridement required. Baseline erythema. No significant cellulitis. 2+ pitting edema. Palpable DP bilaterally. ABIs abnormally elevated. Integumentary (Hair, Skin) Wound #56 status is Open. Original cause of wound was Gradually Appeared. The wound is located on the Left,Medial Lower Leg. The wound measures 13.3cm length x 30cm width x 0.1cm depth; 313.374cm^2 area and 31.337cm^3 volume. The wound is limited to skin breakdown. There is no tunneling or undermining noted. There is a large amount of serous drainage noted. There is medium (34-66%) red, pink granulation within the wound bed. There is a medium (34-66%) amount of necrotic tissue within the wound bed including Eschar and Adherent Slough. The periwound  skin appearance exhibited: Maceration, Moist. Wound #58 status is Open. Original cause of wound was Blister. The wound is located on the Right,Circumferential Lower Leg. The wound measures 14cm length x 29cm width x 0.1cm depth; 318.872cm^2 area and 31.887cm^3 volume. The wound is limited to skin breakdown. There is no tunneling or undermining noted. There is a large amount of serous drainage noted. The wound margin is indistinct and nonvisible. There is medium (34-66%) red, pink granulation within the wound bed. There is a medium (34-66%) amount of necrotic tissue within the wound bed including Adherent Slough. The periwound skin appearance exhibited: Localized Edema, Scarring, Maceration, Moist. The periwound skin appearance did not exhibit: Callus, Crepitus, Excoriation, Fluctuance, Friable, Induration, Rash, Dry/Scaly, Atrophie Blanche, Cyanosis, Ecchymosis, Hemosiderin Staining, Mottled, Pallor, Rubor, Erythema. 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 Scheduled for hip replacement tomorrow. Plan I spoke with Dr. Rudene Christians in orthopedics about her recurrent, active ulcerations. He will  plan on seeing her in the morning and will decide at that time whether to proceed or cancel her hip replacement. The wounds were dressed today with silver alginate, Kerlex, and juxtalite compression garments, so Dr. Rudene Christians can easily inspect her legs tomorrow. I recommended 4 layer elastic compression bandages since she performs minimal ambulation at her baseline and will be nonambulatory after her surgery. Electronic Signature(s) Signed: 05/27/2015 4:02:10 PM By: Loletha Grayer MD Entered By: Loletha Grayer on 05/27/2015 15:39:20 [name] ([id number]) -------------------------------------------------------------------------------- SuperBill Details Patient Name: [name] Date of Service: 05/27/2015 Medical Record Number: [id number] Patient Account Number: [id number] Date of Birth/Sex: Jun 29, 1955 (59 y.o. Female) Treating RN: Ahmed Prima Primary Care Physician: [name] Other Clinician: Referring Physician: [name] Treating Physician/Extender: BURNS III, Charlean Sanfilippo in Treatment: 113 Diagnosis Coding ICD-9 Codes Code Description 457.1 Other Lymphedema - excludes congenital, eyelid or vulva 459.33 Chronic venous hypertension with ulcer and inflammation 707.12 Ulcer of lower limbs, except pressure ulcer; Ulcer of calf ICD-10 Codes Code Description I89.0 Lymphedema, not elsewhere classified E66.9 Obesity, unspecified E11.40 Type 2 diabetes mellitus with diabetic neuropathy, unspecified R26.89 Other abnormalities of gait and mobility 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 I83.222 Varicose veins of left lower extremity with both ulcer of calf and inflammation E11.622 Type 2 diabetes mellitus with other skin ulcer L97.221 Non-pressure chronic ulcer of left calf limited to breakdown of skin Facility Procedures CPT4 Code: [id number] Description: 99213 - WOUND CARE VISIT-LEV 3 EST  PT Modifier: Quantity: 1 Physician Procedures CPT4: Description Modifier Quantity Code [id number] 99213 - WC PHYS LEVEL 3 - EST PT 1 ICD-10 Description Diagnosis 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

## 2015-06-04 ENCOUNTER — Encounter: Payer: Managed Care, Other (non HMO) | Attending: Surgery | Admitting: Surgery

## 2015-06-04 DIAGNOSIS — I83222 Varicose veins of left lower extremity with both ulcer of calf and inflammation: Secondary | ICD-10-CM | POA: Insufficient documentation

## 2015-06-04 DIAGNOSIS — L97221 Non-pressure chronic ulcer of left calf limited to breakdown of skin: Secondary | ICD-10-CM | POA: Insufficient documentation

## 2015-06-04 DIAGNOSIS — I83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: Secondary | ICD-10-CM | POA: Diagnosis not present

## 2015-06-04 DIAGNOSIS — I890 Lymphedema, not elsewhere classified: Secondary | ICD-10-CM | POA: Diagnosis not present

## 2015-06-04 DIAGNOSIS — E1140 Type 2 diabetes mellitus with diabetic neuropathy, unspecified: Secondary | ICD-10-CM | POA: Insufficient documentation

## 2015-06-04 DIAGNOSIS — E669 Obesity, unspecified: Secondary | ICD-10-CM | POA: Insufficient documentation

## 2015-06-04 DIAGNOSIS — L97211 Non-pressure chronic ulcer of right calf limited to breakdown of skin: Secondary | ICD-10-CM | POA: Diagnosis not present

## 2015-06-04 DIAGNOSIS — E11622 Type 2 diabetes mellitus with other skin ulcer: Secondary | ICD-10-CM | POA: Insufficient documentation

## 2015-06-04 DIAGNOSIS — R2689 Other abnormalities of gait and mobility: Secondary | ICD-10-CM | POA: Diagnosis not present

## 2015-06-04 LAB — GLUCOSE, CAPILLARY
GLUCOSE-CAPILLARY: 241 mg/dL — AB (ref 65–99)
Glucose-Capillary: 163 mg/dL — ABNORMAL HIGH (ref 65–99)
Glucose-Capillary: 233 mg/dL — ABNORMAL HIGH (ref 65–99)

## 2015-06-05 DIAGNOSIS — E119 Type 2 diabetes mellitus without complications: Secondary | ICD-10-CM | POA: Diagnosis not present

## 2015-06-05 LAB — GLUCOSE, CAPILLARY
GLUCOSE-CAPILLARY: 177 mg/dL — AB (ref 65–99)
GLUCOSE-CAPILLARY: 271 mg/dL — AB (ref 65–99)
GLUCOSE-CAPILLARY: 143 mg/dL — AB (ref 65–99)
GLUCOSE-CAPILLARY: 169 mg/dL — AB (ref 65–99)
Glucose-Capillary: 238 mg/dL — ABNORMAL HIGH (ref 65–99)
Glucose-Capillary: 251 mg/dL — ABNORMAL HIGH (ref 65–99)
Glucose-Capillary: 224 mg/dL — ABNORMAL HIGH (ref 65–99)
Glucose-Capillary: 223 mg/dL — ABNORMAL HIGH (ref 65–99)

## 2015-06-05 NOTE — Progress Notes (Addendum)
Autumn, [name] ([id number]) Visit Report for 06/04/2015 Chief Complaint Document Details Patient Name: [name], [name]. Date of Service: 06/04/2015 2:30 PM Medical Record Number: [id number] Patient Account Number: [id number] Date of Birth/Sex: [birth year] (59 y.o. 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 Electronic Signature(s) Signed: 06/04/2015 3:14:10 PM By: Christin Fudge MD, FACS Entered By: Christin Fudge on 06/04/2015 15:14:10 [name] ([id number]) -------------------------------------------------------------------------------- HPI Details Patient Name: [name] Date of Service: 06/04/2015 2:30 PM Medical Record Number: [id number] Patient Account Number: [id number] Date of Birth/Sex: 1955/10/22 (58 y.o. Female) Treating RN: Cornell Barman Primary Care Physician: [name] Other Clinician: Referring Physician: [name] Treating Physician/Extender: Frann Rider in Treatment: 38 History of Present Illness HPI Description: 59yo w/ h/o BLE phlebolymphedema, type 2 DM (unknown hemoglobin A1c), and obesity. No PVD. h/o chronic, recurrent BLE calf ulcers. Treated with 4 layer compression. Infrequently uses lymphedema pump. Bilateral lower extremity ulcerations healed as of June 2016. Fitted for custom compression stockings but did not receive them. Patient could not travel for lymphedema consult. She developed recurrent bilateral lower extremity ulcerations in August 2016. She is able to heal these quite quickly with 4 layer elastic compression bandages. However, she is not very compliant using her juxtalite compression garments and tends to develop recurrent ulcerations while  using juxtalite. Unna boots were applied this past week. She tolerated this well but performs minimal ambulation. She is scheduled to undergo total hip replacement tomorrow with Dr. Rudene Christians. She is unsure if he is aware of her ulcerations. She is otherwise without complaints. No significant pain. No fever or chills. Moderate clear drainage. Not on any antibiotics. 06/04/2015 -- she did have her hip surgery and has now been in rehabilitation and they're controlling her fluid intake and diuretics to the extent that she has lost 13 pounds of weight and her lower extremity circumference is decreased by 7 cm. Electronic Signature(s) Signed: 06/04/2015 3:15:14 PM By: Christin Fudge MD, FACS Entered By: Christin Fudge on 06/04/2015 15:15:14 [name] ([id number]) -------------------------------------------------------------------------------- Physical Exam Details Patient Name: [name] Date of Service: 06/04/2015 2:30 PM Medical Record Number: [id number] Patient Account Number: [id number] Date of Birth/Sex: [birth year] (59 y.o. Female) Treating RN: Cornell Barman Primary Care Physician: [name] Other Clinician: Referring Physician: [name] Treating Physician/Extender: Frann Rider in Treatment: 114 Constitutional . Pulse regular. Respirations normal and unlabored. Afebrile. . Eyes Nonicteric. Reactive to light. Ears, Nose, Mouth, and Throat Lips, teeth, and gums WNL.Marland Kitchen Moist mucosa without lesions . Neck supple and nontender. No palpable supraclavicular or cervical adenopathy. Normal sized without goiter. Respiratory WNL. No retractions.. Cardiovascular Pedal Pulses WNL. No clubbing, cyanosis or edema. Lymphatic No adneopathy. No adenopathy. No adenopathy. Musculoskeletal Adexa without tenderness or enlargement.. Digits and nails w/o clubbing, cyanosis, infection, petechiae, ischemia, or inflammatory conditions.. Integumentary (Hair, Skin) No suspicious  lesions. No crepitus or fluctuance. No peri-wound warmth or erythema. No masses.Marland Kitchen Psychiatric Judgement and insight Intact.. No evidence of depression, anxiety, or agitation.. Notes both lower extremities are remarkably better as far as the edema goes. Her ulceration looks good right is slightly larger than the left. Healthy granulation tissue and no cellulitis. Electronic Signature(s) Signed: 06/04/2015 3:16:24 PM By: Christin Fudge MD, FACS Entered By: Christin Fudge on 06/04/2015 15:16:24 [name] ([id number]) -------------------------------------------------------------------------------- Physician Orders Details Patient Name:  [name], [name] Date of Service: 06/04/2015 2:30 PM Medical Record Number: [id number] Patient Account Number: [id number] Date of Birth/Sex: 1956/02/07 (59 y.o. Female) Treating RN: Cornell Barman Primary Care Physician: [name] Other Clinician: Referring Physician: [name] Treating Physician/Extender: Frann Rider in Treatment: 83 Verbal / Phone Orders: Yes Clinician: Cornell Barman Read Back and Verified: Yes Diagnosis Coding ICD-9 Coding Code Description 457.1 Other Lymphedema - excludes congenital, eyelid or vulva 459.33 Chronic venous hypertension with ulcer and inflammation 707.12 Ulcer of lower limbs, except pressure ulcer; Ulcer of calf ICD-10 Coding Code Description I89.0 Lymphedema, not elsewhere classified E66.9 Obesity, unspecified E11.40 Type 2 diabetes mellitus with diabetic neuropathy, unspecified R26.89 Other abnormalities of gait and mobility 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 I83.222 Varicose veins of left lower extremity with both ulcer of calf and inflammation E11.622 Type 2 diabetes mellitus with other skin ulcer L97.221 Non-pressure chronic ulcer of left calf limited to breakdown of skin Wound Cleansing Wound #56 Left,Circumferential Lower Leg o Cleanse  wound with mild soap and water Wound #56 Left,Medial Lower Leg o Cleanse wound with mild soap and water Wound #58 Right,Circumferential Lower Leg o Cleanse wound with mild soap and water Anesthetic Wound #56 Left,Circumferential Lower Leg o Topical Lidocaine 4% cream applied to wound bed prior to debridement Wound #56 Left,Medial Lower Leg o Topical Lidocaine 4% cream applied to wound bed prior to debridement Wound #58 Right,Circumferential Lower Leg Kawabata, Kimberlyann K. ([id number]) o Topical Lidocaine 4% cream applied to wound bed prior to debridement Primary Wound Dressing Wound #56 Left,Circumferential Lower Leg o Aquacel Ag Wound #56 Left,Medial Lower Leg o Aquacel Ag Wound #58 Right,Circumferential Lower Leg o Aquacel Ag Secondary Dressing Wound #56 Left,Circumferential Lower Leg o ABD pad Wound #56 Left,Medial Lower Leg o ABD pad Wound #58 Right,Circumferential Lower Leg o ABD pad Dressing Change Frequency Wound #56 Left,Circumferential Lower Leg o Change dressing every week Wound #56 Left,Medial Lower Leg o Change dressing every week Wound #58 Right,Circumferential Lower Leg o Change dressing every week Follow-up Appointments Wound #56 Left,Circumferential Lower Leg o Return Appointment in 1 week. Wound #56 Left,Medial Lower Leg o Return Appointment in 1 week. Wound #58 Right,Circumferential Lower Leg o Return Appointment in 1 week. Edema Control Wound #56 Left,Circumferential Lower Leg o 4 Layer Compression System - Bilateral Wound #56 Left,Medial Lower Leg Lacock, Aviela K. ([id number]) o 4 Layer Compression System - Bilateral Wound #58 Right,Circumferential Lower Leg o 4 Layer Compression System - Bilateral Electronic Signature(s) Signed: 06/04/2015 4:08:23 PM By: Christin Fudge MD, FACS Signed: 06/05/2015 3:49:12 PM By: Gretta Cool RN, BSN, Kim RN, BSN Entered By: Gretta Cool, RN, BSN, Kim on 06/04/2015 15:58:03 [name] ([id number]) -------------------------------------------------------------------------------- Problem List Details Patient Name: [name]. Date of Service: 06/04/2015 2:30 PM Medical Record Number: [id number] Patient Account Number: [id number] Date of Birth/Sex: Feb 04, 1956 (59 y.o. Female) Treating RN: Cornell Barman Primary Care Physician: [name] Other Clinician: Referring Physician: [name] Treating Physician/Extender: Frann Rider in Treatment: 114 Active Problems ICD-9 Encounter Code Description Active Date Diagnosis 457.1 Other Lymphedema - excludes congenital, eyelid or vulva 03/27/2013 Yes 459.33 Chronic venous hypertension with ulcer and inflammation 03/27/2013 Yes 707.12 Ulcer of lower limbs, except pressure ulcer; Ulcer of calf 03/27/2013 Yes ICD-10 Encounter Code Description Active Date Diagnosis I89.0 Lymphedema, not elsewhere classified 03/26/2014 Yes E66.9 Obesity, unspecified 03/26/2014 Yes E11.40 Type 2 diabetes mellitus with diabetic neuropathy, 01/14/2015 Yes unspecified R26.89 Other abnormalities of gait and  mobility 01/14/2015 Yes 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 02/25/2015 Yes calf and inflammation I83.222 Varicose veins of left lower extremity with both ulcer of 11/12/2014 Yes calf and inflammation TANZY, STUMBAUGH. ([id number]) E11.622 Type 2 diabetes mellitus with other skin ulcer 02/25/2015 Yes L97.221 Non-pressure chronic ulcer of left calf limited to 05/12/2015 Yes breakdown of skin Inactive Problems Resolved Problems ICD-10 Code Description Active Date Resolved Date E11.621 Type 2 diabetes mellitus with foot ulcer 10/08/2014 10/09/2014 L03.115 Cellulitis of right lower limb 02/25/2015 02/25/2015 L84 Corns and callosities 11/12/2014 11/12/2014 Electronic Signature(s) Signed: 06/04/2015 3:14:00 PM By: Christin Fudge MD, FACS Entered By: Christin Fudge on 06/04/2015 15:14:00 [name]  ([id number]) -------------------------------------------------------------------------------- Progress Note Details Patient Name: [name]. Date of Service: 06/04/2015 2:30 PM Medical Record Number: [id number] Patient Account Number: [id number] Date of Birth/Sex: Jul 30, 1955 (59 y.o. 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 History of Present Illness (HPI) 59yo w/ h/o BLE phlebolymphedema, type 2 DM (unknown hemoglobin A1c), and obesity. No PVD. h/o chronic, recurrent BLE calf ulcers. Treated with 4 layer compression. Infrequently uses lymphedema pump. Bilateral lower extremity ulcerations healed as of June 2016. Fitted for custom compression stockings but did not receive them. Patient could not travel for lymphedema consult. She developed recurrent bilateral lower extremity ulcerations in August 2016. She is able to heal these quite quickly with 4 layer elastic compression bandages. However, she is not very compliant using her juxtalite compression garments and tends to develop recurrent ulcerations while using juxtalite. Unna boots were applied this past week. She tolerated this well but performs minimal ambulation. She is scheduled to undergo total hip replacement tomorrow with Dr. Rudene Christians. She is unsure if he is aware of her ulcerations. She is otherwise without complaints. No significant pain. No fever or chills. Moderate clear drainage. Not on any antibiotics. 06/04/2015 -- she did have her hip surgery and has now been in rehabilitation and they're controlling her fluid intake and diuretics to the extent that she has lost 13 pounds of weight and her lower extremity circumference is decreased by 7  cm. Objective Constitutional Pulse regular. Respirations normal and unlabored. Afebrile. Vitals Time Taken: 2:53 PM, Height: 65 in, Temperature: 98.5 F, Pulse: 69 bpm, Respiratory Rate: 18 breaths/min, Blood Pressure: 138/48 mmHg. CHANTHA, CROZIER ([id number]) Eyes Nonicteric. Reactive to light. Ears, Nose, Mouth, and Throat Lips, teeth, and gums WNL.Marland Kitchen Moist mucosa without lesions . Neck supple and nontender. No palpable supraclavicular or cervical adenopathy. Normal sized without goiter. Respiratory WNL. No retractions.. Cardiovascular Pedal Pulses WNL. No clubbing, cyanosis or edema. Lymphatic No adneopathy. No adenopathy. No adenopathy. Musculoskeletal Adexa without tenderness or enlargement.. Digits and nails w/o clubbing, cyanosis, infection, petechiae, ischemia, or inflammatory conditions.Marland Kitchen Psychiatric Judgement and insight Intact.. No evidence of depression, anxiety, or agitation.. General Notes: both lower extremities are remarkably better as far as the edema goes. Her ulceration looks good right is slightly larger than the left. Healthy granulation tissue and no cellulitis. Integumentary (Hair, Skin) No suspicious lesions. No crepitus or fluctuance. No peri-wound warmth or erythema. No masses.. Wound #56 status is Open. Original cause of wound was Gradually Appeared. The wound is located on the Left,Medial Lower Leg. The wound measures 3cm length x 2.5cm width x 0.1cm depth; 5.89cm^2 area and 0.589cm^3 volume. The  wound is limited to skin breakdown. There is a large amount of serous drainage noted. The wound margin is distinct with the outline attached to the wound base. There is medium (34-66%) red, pink granulation within the wound bed. There is a medium (34-66%) amount of necrotic tissue within the wound bed including Eschar and Adherent Slough. The periwound skin appearance exhibited: Maceration, Moist. Wound #58 status is Open. Original cause of wound was Blister.  The wound is located on the Right,Circumferential Lower Leg. The wound measures 8cm length x 3.5cm width x 0.1cm depth; 21.991cm^2 area and 2.199cm^3 volume. The wound is limited to skin breakdown. There is a large amount of serous drainage noted. The wound margin is indistinct and nonvisible. There is medium (34-66%) red, pink granulation within the wound bed. There is a medium (34-66%) amount of necrotic tissue within the wound bed including Adherent Slough. The periwound skin appearance exhibited: Localized Edema, Scarring, Maceration, Moist. The periwound skin appearance did not exhibit: Callus, Crepitus, Excoriation, Fluctuance, Friable, Induration, Rash, Dry/Scaly, Atrophie Blanche, Cyanosis, Ecchymosis, Hemosiderin Staining, Mottled, Pallor, Rubor, Erythema. JULIANNY, KARY ([id number]) 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 I83.222 - Varicose veins of left lower extremity with both ulcer of calf and inflammation E11.622 - Type 2 diabetes mellitus with other skin ulcer L97.221 - Non-pressure chronic ulcer of left calf limited to breakdown of skin At this stage I'm going to try my best to take advantage of the significant fluid loss from her lower extremities. I have recommended we continue with silver alginate and a 4-layer Profore wrap. She is also encouraged to elevate and exercise as permitted by her rehabilitation facility. She will come back and see me next week. Plan Wound Cleansing: Wound #56  Left,Circumferential Lower Leg: Cleanse wound with mild soap and water Wound #56 Left,Medial Lower Leg: Cleanse wound with mild soap and water Wound #58 Right,Circumferential Lower Leg: Cleanse wound with mild soap and water Anesthetic: Wound #56 Left,Circumferential Lower Leg: Topical Lidocaine 4% cream applied to wound bed prior to debridement Wound #56 Left,Medial Lower Leg: Topical Lidocaine 4% cream applied to wound bed prior to debridement Wound #58 Right,Circumferential Lower Leg: LUTISHA, DAFFRON. ([id number]) Topical Lidocaine 4% cream applied to wound bed prior to debridement Primary Wound Dressing: Wound #56 Left,Circumferential Lower Leg: Aquacel Ag Wound #56 Left,Medial Lower Leg: Aquacel Ag Wound #58 Right,Circumferential Lower Leg: Aquacel Ag Secondary Dressing: Wound #56 Left,Circumferential Lower Leg: ABD pad Wound #56 Left,Medial Lower Leg: ABD pad Wound #58 Right,Circumferential Lower Leg: ABD pad Dressing Change Frequency: Wound #56 Left,Circumferential Lower Leg: Change dressing every week Wound #56 Left,Medial Lower Leg: Change dressing every week Wound #58 Right,Circumferential Lower Leg: Change dressing every week Follow-up Appointments: Wound #56 Left,Circumferential Lower Leg: Return Appointment in 1 week. Wound #56 Left,Medial Lower Leg: Return Appointment in 1 week. Wound #58 Right,Circumferential Lower Leg: Return Appointment in 1 week. Edema Control: Wound #56 Left,Circumferential Lower Leg: 4 Layer Compression System - Bilateral Wound #56 Left,Medial Lower Leg: 4 Layer Compression System - Bilateral Wound #58 Right,Circumferential Lower Leg: 4 Layer Compression System -  Bilateral At this stage I'm going to try my best to take advantage of the significant fluid loss from her lower extremities. I have recommended we continue with silver alginate and a 4-layer Profore wrap. She is also encouraged to elevate and exercise as permitted by  her rehabilitation facility. She will come back and see me next week. DWANNA, MAIGNAN ([id number]) Electronic Signature(s) Signed: 06/04/2015 4:02:32 PM By: Christin Fudge MD, FACS Previous Signature: 06/04/2015 3:17:45 PM Version By: Christin Fudge MD, FACS Entered By: Christin Fudge on 06/04/2015 16:02:32 [name] ([id number]) -------------------------------------------------------------------------------- SuperBill Details Patient Name: [name] Date of Service: 06/04/2015 Medical Record Number: [id number] Patient Account Number: [id number] Date of Birth/Sex: 21-Oct-1955 (59 y.o. Female) Treating RN: Cornell Barman Primary Care Physician: [name] Other Clinician: Referring Physician: [name] Treating Physician/Extender: Frann Rider in Treatment: 114 Diagnosis Coding ICD-9 Codes Code Description 457.1 Other Lymphedema - excludes congenital, eyelid or vulva 459.33 Chronic venous hypertension with ulcer and inflammation 707.12 Ulcer of lower limbs, except pressure ulcer; Ulcer of calf ICD-10 Codes Code Description I89.0 Lymphedema, not elsewhere classified E66.9 Obesity, unspecified E11.40 Type 2 diabetes mellitus with diabetic neuropathy, unspecified R26.89 Other abnormalities of gait and mobility 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

## 2015-06-06 DIAGNOSIS — E119 Type 2 diabetes mellitus without complications: Secondary | ICD-10-CM | POA: Diagnosis not present

## 2015-06-06 LAB — GLUCOSE, CAPILLARY
GLUCOSE-CAPILLARY: 235 mg/dL — AB (ref 65–99)
GLUCOSE-CAPILLARY: 169 mg/dL — AB (ref 65–99)

## 2015-06-06 NOTE — Progress Notes (Signed)
HELDANA, RODRIGUES ([id number]) Visit Report for 06/04/2015 Arrival Information Details Patient Name: [name], [name]. Date of Service: 06/04/2015 2:30 PM Medical Record Number: [id number] Patient Account Number: [id number] Date of Birth/Sex: 09/02/1955 (59 y.o. Female) Treating RN: Cornell Barman Primary Care Physician: [name] Other Clinician: Referring Physician: [name] Treating Physician/Extender: Frann Rider in Treatment: 40 Visit Information History Since Last Visit Added or deleted any medications: Yes Patient Arrived: Wheel Chair Any new allergies or adverse reactions: No Arrival Time: 14:52 Had a fall or experienced change in No Accompanied By: self activities of daily living that may affect Transfer Assistance: Manual risk of falls: Patient Identification Verified: Yes Signs or symptoms of abuse/neglect since last No Secondary Verification Process Yes visito Completed: Hospitalized since last visit: No Patient Requires Transmission- No Pain Present Now: No Based Precautions: Patient Has Alerts: Yes Patient Alerts: Patient on Blood Thinner Electronic Signature(s) Signed: 06/05/2015 3:49:12 PM By: Gretta Cool, RN, BSN, Kim RN, BSN Entered By: Gretta Cool, RN, BSN, Kim on 06/04/2015 14:53:18 [name] ([id number]) -------------------------------------------------------------------------------- Encounter Discharge Information Details Patient Name: [name]. Date of Service: 06/04/2015 2:30 PM Medical Record Number: [id number] Patient Account Number: [id number] Date of Birth/Sex: Feb 18, 1956 (59 y.o. Female) Treating RN: Cornell Barman Primary Care Physician: [name] Other Clinician: Referring Physician: [name] Treating Physician/Extender: Frann Rider in Treatment: 114 Encounter Discharge Information Items Discharge Pain Level: 0 Discharge Condition: Stable Ambulatory Status: Wheelchair Discharge Destination: Nursing  Home Transportation: Private Auto Accompanied By: self Schedule Follow-up Appointment: Yes Medication Reconciliation completed and provided to Patient/Care Yes Madisan Bice: Provided on Clinical Summary of Care: 06/04/2015 Form Type Recipient Paper Patient GI Electronic Signature(s) Signed: 06/05/2015 3:49:12 PM By: Gretta Cool RN, BSN, Kim RN, BSN Previous Signature: 06/04/2015 3:36:54 PM Version By: Ruthine Dose Entered By: Gretta Cool RN, BSN, Kim on 06/04/2015 15:59:29 [name] ([id number]) -------------------------------------------------------------------------------- Lower Extremity Assessment Details Patient Name: [name]. Date of Service: 06/04/2015 2:30 PM Medical Record Number: [id number] Patient Account Number: [id number] Date of Birth/Sex: [birth year] (59 y.o. Female) Treating RN: Cornell Barman Primary Care Physician: [name] Other Clinician: Referring Physician: [name] Treating Physician/Extender: Frann Rider in Treatment: 114 Edema Assessment Assessed: [name]: No] [Right: No] E[Left: dema] [Right: :] Calf Left: Right: Point of Measurement: 24 cm From Medial Instep 39.5 cm 36.4 cm Ankle Left: Right: Point of Measurement: 8 cm From Medial Instep 23.5 cm 23 cm Vascular Assessment Pulses: Posterior Tibial Dorsalis Pedis Palpable: [Left:Yes] [Right:Yes] Extremity colors, hair growth, and conditions: Extremity Color: [Left:Hyperpigmented] [Right:Hyperpigmented] Hair Growth on Extremity: [Left:Yes] [Right:Yes] Temperature of Extremity: [Left:Warm] [Right:Warm] Toe Nail Assessment Left: Right: Thick: Yes Yes Discolored: Yes Yes Deformed: No Improper Length and Hygiene: No No Electronic Signature(s) Signed: 06/05/2015 3:49:12 PM By: Gretta Cool, RN, BSN, Kim RN, BSN Entered By: Gretta Cool, RN, BSN, Kim on 06/04/2015 15:06:16 [name] ([id number]) -------------------------------------------------------------------------------- Multi Wound  Chart Details Patient Name: [name]. Date of Service: 06/04/2015 2:30 PM Medical Record Number: [id number] Patient Account Number: [id number] Date of Birth/Sex: 1956/03/29 (59 y.o. 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] [58:Lymphedema] [N/A:N/A]  Secondary Etiology: [56:N/A] [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] [N/A:N/A] Comorbid History: [56:Lymphedema, Hypertension, Type II Diabetes] [58:Lymphedema, Hypertension, Type II Diabetes] [N/A:N/A] Date Acquired: [56:05/04/2015] [58:05/15/2015] [N/A:N/A] Weeks of Treatment: [56:3] [58:2] [N/A:N/A] Wound Status: [56:Open] [58:Open] [N/A:N/A] Clustered Wound: [56:Yes] [58:Yes] [N/A:N/A] Measurements L x W x D 3x2.5x0.1 [58:8x3.5x0.1] [N/A:N/A] (cm) Area (cm) : [56:5.89] [58:21.991] [N/A:N/A] Volume (cm) : [56:0.589] [58:2.199] [N/A:N/A] % Reduction in Area: [56:54.30%] [58:76.70%] [N/A:N/A] % Reduction in Volume: 54.30% [58:76.70%] [N/A:N/A] Classification: [56:Grade 1] [58:Partial Thickness] [N/A:N/A] HBO Classification: [56:N/A] [58:Grade 1] [N/A:N/A] Exudate Amount: [56:Large] [58:Large] [N/A:N/A] Exudate Type: [56:Serous] [58:Serous] [N/A:N/A] Exudate Color: [56:amber] [58:amber] [N/A:N/A] Foul Odor After [56:Yes] [58:No] [N/A:N/A] Cleansing: Odor Anticipated Due to No [58:N/A] [N/A:N/A] Product UseLYNCOLN, HOUSLER  ([id number]) Wound Margin: Distinct, outline attached Indistinct, nonvisible N/A Granulation Amount: Medium (34-66%) Medium (34-66%) N/A Granulation Quality: Red, Pink Red, Pink N/A Necrotic Amount: Medium (34-66%) Medium (34-66%) N/A Necrotic Tissue: Eschar, Adherent Slough Adherent Slough N/A Exposed Structures: Fascia: No Fascia: No N/A Fat: No Fat: No Tendon: No Tendon: No Muscle: No Muscle: No Joint: No Joint: No Bone: No Bone: No Limited to Skin Limited to Skin Breakdown Breakdown Epithelialization: None None N/A Periwound Skin Texture: No Abnormalities Noted Edema: Yes N/A Scarring: Yes Excoriation: No Induration: No Callus: No Crepitus: No Fluctuance: No Friable: No Rash: No Periwound Skin Maceration: Yes Maceration: Yes N/A Moisture: Moist: Yes Moist: Yes Dry/Scaly: No Periwound Skin Color: No Abnormalities Noted Atrophie Blanche: No N/A Cyanosis: No Ecchymosis: No Erythema: No Hemosiderin Staining: No Mottled: No Pallor: No Rubor: No Tenderness on No No N/A Palpation: Wound Preparation: Ulcer Cleansing: Other: Ulcer Cleansing: Other: N/A soap and water soap and water Topical Anesthetic Topical Anesthetic Applied: Other: lidocaine Applied: None 4% Treatment Notes Electronic Signature(s) Signed: 06/05/2015 3:49:12 PM By: Gretta Cool, RN, BSN, Kim RN, BSN Entered By: Gretta Cool, RN, BSN, Kim on 06/04/2015 15:08:12 [name] ([name] ([id number]) -------------------------------------------------------------------------------- Multi-Disciplinary Care Plan Details Patient Name: [name] Date of Service: 06/04/2015 2:30 PM Medical Record Number: [id number] Patient Account Number: [id number] Date of Birth/Sex: 1956/01/15 (59 y.o. Female) Treating RN: Cornell Barman Primary Care Physician: [name] Other Clinician: Referring Physician: [name] Treating Physician/Extender: Frann Rider in Treatment:  114 Active Inactive Nutrition Nursing Diagnoses: Impaired glucose control: actual or potential Goals: Patient/caregiver verbalizes understanding of need to maintain therapeutic glucose control per primary care physician Date Initiated: 12/16/2013 Goal Status: Active Interventions: Provide education on elevated blood sugars and impact on wound healing Notes: Orientation to the Wound Care Program Nursing Diagnoses: Knowledge deficit related to the wound healing center program Goals: Patient/caregiver will verbalize understanding of the [facility] Date Initiated: 04/03/2013 Goal Status: Active Interventions: Provide education on orientation to the wound center Notes: Soft Tissue Infection Nursing Diagnoses: Impaired tissue integrity Goals: Patient will remain free of wound infection CAROLLE, DUELL ([id number]) Date Initiated: 12/16/2013 Goal Status: Active Interventions: Assess signs and symptoms of infection every visit Notes: Venous Leg Ulcer Nursing Diagnoses: Knowledge deficit related to disease process and management Potential for venous Insuffiency (use before diagnosis confirmed) Goals: Patient will maintain optimal edema control Date Initiated: 08/18/2014 Goal Status: Active Interventions: Assess peripheral edema status every visit. Compression as ordered Treatment Activities: Therapeutic compression applied : 06/04/2015 Notes: Electronic Signature(s) Signed: 06/05/2015 3:49:12 PM By: Gretta Cool, RN, BSN, Kim RN, BSN Entered By: Gretta Cool, RN, BSN, Kim on 06/04/2015 15:08:01 [name] ([id number]) -------------------------------------------------------------------------------- Pain Assessment Details Patient Name: [name]. Date of  Service: 06/04/2015 2:30 PM Medical Record Number: [id number] Patient Account Number: [id number] Date of Birth/Sex: November 28, 1955 (59 y.o. Female) Treating RN: Cornell Barman Primary Care Physician: [name] Other Clinician: Referring Physician: [name] Treating Physician/Extender: Frann Rider in Treatment: 114 Active Problems Location of Pain Severity and Description of Pain Patient Has Paino No Site Locations Pain Management and Medication Current Pain Management: Electronic Signature(s) Signed: 06/05/2015 3:49:12 PM By: Gretta Cool, RN, BSN, Kim RN, BSN Entered By: Gretta Cool, RN, BSN, Kim on 06/04/2015 14:53:25 [name] ([id number]) -------------------------------------------------------------------------------- Patient/Caregiver Education Details Patient Name: [name] Date of Service: 06/04/2015 2:30 PM Medical Record Number: [id number] Patient Account Number: [id number] Date of Birth/Gender: 02/04/1956 (59 y.o. Female) Treating RN: Cornell Barman Primary Care Physician: [name] Other Clinician: Referring Physician: [name] Treating Physician/Extender: Frann Rider in Treatment: 114 Education Assessment Education Provided To: Patient Education Topics Provided Wound/Skin Impairment: Handouts: Caring for Your Ulcer Methods: Demonstration, Explain/Verbal Responses: State content correctly Electronic Signature(s) Signed: 06/05/2015 3:49:12 PM By: Gretta Cool, RN, BSN, Kim RN, BSN Entered By: Gretta Cool, RN, BSN, Kim on 06/04/2015 15:59:46 [name] ([id number]) -------------------------------------------------------------------------------- Wound Assessment Details Patient Name: [name] Date of Service: 06/04/2015 2:30 PM Medical Record Number: [id number] Patient Account Number: [id number] Date of Birth/Sex: [birth year] (59 y.o. Female) Treating RN: Cornell Barman Primary Care Physician: [name] Other Clinician: Referring Physician: [name] Treating Physician/Extender: Frann Rider in Treatment: 114 Wound Status Wound Number: 56 Primary Diabetic Wound/Ulcer of the Lower Etiology: Extremity Wound Location:  Left Lower Leg - Medial Wound Status: Open Wounding Event: Gradually Appeared Comorbid Lymphedema, Hypertension, Type II Date Acquired: 05/04/2015 History: Diabetes Weeks Of Treatment: 3 Clustered Wound: Yes Photos Photo Uploaded By: Gretta Cool, RN, BSN, Kim on 06/04/2015 16:13:49 Wound Measurements Length: (cm) 3 Width: (cm) 2.5 Depth: (cm) 0.1 Area: (cm) 5.89 Volume: (cm) 0.589 % Reduction in Area: 54.3% % Reduction in Volume: 54.3% Epithelialization: None Wound Description Classification: Grade 1 Foul Odor Aft Wound Margin: Distinct, outline attached Due to Produc Exudate Amount: Large Exudate Type: Serous Exudate Color: amber er Cleansing: Yes t Use: No Wound Bed Granulation Amount: Medium (34-66%) Exposed Structure Granulation Quality: Red, Pink Fascia Exposed: No Necrotic Amount: Medium (34-66%) Fat Layer Exposed: No Necrotic Quality: Eschar, Adherent Slough Tendon Exposed: No Billing, Aayana K. ([id number]) Muscle Exposed: No Joint Exposed: No Bone Exposed: No Limited to Skin Breakdown Periwound Skin Texture Texture Color No Abnormalities Noted: No No Abnormalities Noted: No Moisture No Abnormalities Noted: No Maceration: Yes Moist: Yes Wound Preparation Ulcer Cleansing: Other: soap and water, Topical Anesthetic Applied: Other: lidocaine 4%, Treatment Notes Wound #56 (Left, Medial Lower Leg) 1. Cleansed with: Cleanse wound with antibacterial soap and water 2. Anesthetic Topical Lidocaine 4% cream to wound bed prior to debridement 4. Dressing Applied: Aquacel Ag 5. Secondary Dressing Applied ABD Pad 7. Secured with 4 Layer Compression System - Bilateral Electronic Signature(s) Signed: 06/05/2015 3:49:12 PM By: Gretta Cool, RN, BSN, Kim RN, BSN Entered By: Gretta Cool, RN, BSN, Kim on 06/04/2015 15:07:21 [name] ([id number]) -------------------------------------------------------------------------------- Wound Assessment Details Patient Name:  [name] Date of Service: 06/04/2015 2:30 PM Medical Record Number: [id number] Patient Account Number: [id number] Date of Birth/Sex: 10/21/1955 (59 y.o. Female) Treating RN: Cornell Barman Primary Care Physician: [name] Other Clinician: Referring Physician: [name] Treating Physician/Extender: Frann Rider in Treatment: 114 Wound Status Wound Number: 58 Primary Lymphedema Etiology: Wound Location: Right Lower Leg - Circumfernential Secondary Diabetic Wound/Ulcer of the Lower Etiology: Extremity  Wounding Event: Blister Wound Status: Open Date Acquired: 05/15/2015 Comorbid Lymphedema, Hypertension, Type Weeks Of Treatment: 2 History: II Diabetes Clustered Wound: Yes Wound Measurements Length: (cm) 8 Width: (cm) 3.5 Depth: (cm) 0.1 Area: (cm) 21.991 Volume: (cm) 2.199 % Reduction in Area: 76.7% % Reduction in Volume: 76.7% Epithelialization: None Wound Description Classification: Partial Thickness Diabetic Severity (Wagner): Grade 1 Wound Margin: Indistinct, nonvisib Exudate Amount: Large Exudate Type: Serous Exudate Color: amber Foul Odor After Cleansing: No le Wound Bed Granulation Amount: Medium (34-66%) Exposed Structure Granulation Quality: Red, Pink Fascia Exposed: No Necrotic Amount: Medium (34-66%) Fat Layer Exposed: No Necrotic Quality: Adherent Slough Tendon Exposed: No Muscle Exposed: No Joint Exposed: No Bone Exposed: No Limited to Skin Breakdown Periwound Skin Texture Texture Color No Abnormalities Noted: No No Abnormalities Noted: No Callus: No Atrophie Blanche: No Crepitus: No Cyanosis: No Spraggins, Jaidan K. ([id number]) Excoriation: No Ecchymosis: No Fluctuance: No Erythema: No Friable: No Hemosiderin Staining: No Induration: No Mottled: No Localized Edema: Yes Pallor: No Rash: No Rubor: No Scarring: Yes Moisture No Abnormalities Noted: No Dry / Scaly: No Maceration: Yes Moist: Yes Wound  Preparation Ulcer Cleansing: Other: soap and water, Topical Anesthetic Applied: None Treatment Notes Wound #58 (Right, Circumferential Lower Leg) 1. Cleansed with: Cleanse wound with antibacterial soap and water 2. Anesthetic Topical Lidocaine 4% cream to wound bed prior to debridement 4. Dressing Applied: Aquacel Ag 5. Secondary Dressing Applied ABD Pad 7. Secured with 4 Layer Compression System - Bilateral Electronic Signature(s) Signed: 06/05/2015 3:49:12 PM By: Gretta Cool, RN, BSN, Kim RN, BSN Entered By: Gretta Cool, RN, BSN, Kim on 06/04/2015 15:07:31 [name] ([id number]) -------------------------------------------------------------------------------- Vitals Details Patient Name: [name] Date of Service: 06/04/2015 2:30 PM Medical Record Number: [id number] Patient Account Number: [id number] Date of Birth/Sex: 1956-02-01 (59 y.o. Female) Treating RN: Cornell Barman Primary Care Physician: [name] Other Clinician: Referring Physician: [name] Treating Physician/Extender: Frann Rider in Treatment: 114 Vital Signs Time Taken: 14:53 Temperature (F): 98.5 Height (in): 65 Pulse (bpm): 69 Respiratory Rate (breaths/min): 18 Blood Pressure (mmHg): 138/48 Reference Range: 80 - 120 mg / dl Electronic Signature(s) Signed: 06/05/2015 3:49:12 PM By: Gretta Cool, RN, BSN, Kim RN, BSN Entered By: Gretta Cool, RN, BSN, Kim on 06/04/2015 14:53:53

## 2015-06-07 LAB — GLUCOSE, CAPILLARY
GLUCOSE-CAPILLARY: 165 mg/dL — AB (ref 65–99)
GLUCOSE-CAPILLARY: 230 mg/dL — AB (ref 65–99)
GLUCOSE-CAPILLARY: 258 mg/dL — AB (ref 65–99)
GLUCOSE-CAPILLARY: 286 mg/dL — AB (ref 65–99)
GLUCOSE-CAPILLARY: 232 mg/dL — AB (ref 65–99)
Glucose-Capillary: 168 mg/dL — ABNORMAL HIGH (ref 65–99)
Glucose-Capillary: 214 mg/dL — ABNORMAL HIGH (ref 65–99)

## 2015-06-08 DIAGNOSIS — E119 Type 2 diabetes mellitus without complications: Secondary | ICD-10-CM | POA: Diagnosis not present

## 2015-06-10 LAB — GLUCOSE, CAPILLARY
GLUCOSE-CAPILLARY: 240 mg/dL — AB (ref 65–99)
GLUCOSE-CAPILLARY: 229 mg/dL — AB (ref 65–99)
GLUCOSE-CAPILLARY: 164 mg/dL — AB (ref 65–99)
GLUCOSE-CAPILLARY: 189 mg/dL — AB (ref 65–99)
GLUCOSE-CAPILLARY: 161 mg/dL — AB (ref 65–99)
GLUCOSE-CAPILLARY: 242 mg/dL — AB (ref 65–99)
Glucose-Capillary: 182 mg/dL — ABNORMAL HIGH (ref 65–99)
Glucose-Capillary: 251 mg/dL — ABNORMAL HIGH (ref 65–99)
Glucose-Capillary: 135 mg/dL — ABNORMAL HIGH (ref 65–99)

## 2015-06-11 ENCOUNTER — Encounter: Payer: Managed Care, Other (non HMO) | Admitting: Surgery

## 2015-06-11 DIAGNOSIS — E119 Type 2 diabetes mellitus without complications: Secondary | ICD-10-CM | POA: Diagnosis not present

## 2015-06-11 DIAGNOSIS — E11622 Type 2 diabetes mellitus with other skin ulcer: Secondary | ICD-10-CM | POA: Diagnosis not present

## 2015-06-11 LAB — GLUCOSE, CAPILLARY
Glucose-Capillary: 242 mg/dL — ABNORMAL HIGH (ref 65–99)
Glucose-Capillary: 157 mg/dL — ABNORMAL HIGH (ref 65–99)
Glucose-Capillary: 198 mg/dL — ABNORMAL HIGH (ref 65–99)

## 2015-06-12 DIAGNOSIS — E119 Type 2 diabetes mellitus without complications: Secondary | ICD-10-CM | POA: Diagnosis not present

## 2015-06-12 LAB — GLUCOSE, CAPILLARY
Glucose-Capillary: 187 mg/dL — ABNORMAL HIGH (ref 65–99)
Glucose-Capillary: 232 mg/dL — ABNORMAL HIGH (ref 65–99)
Glucose-Capillary: 220 mg/dL — ABNORMAL HIGH (ref 65–99)

## 2015-06-12 NOTE — Progress Notes (Addendum)
CARIANN, WEITMAN ([id number]) Visit Report for 06/11/2015 Chief Complaint Document Details Patient Name: [name], [name]. Date of Service: 06/11/2015 2:15 PM Medical Record Number: [id number] Patient Account Number: [id number] Date of Birth/Sex: August 29, 1955 (59 y.o. 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 Electronic Signature(s) Signed: 06/11/2015 3:21:41 PM By: Christin Fudge MD, FACS Entered By: Christin Fudge on 06/11/2015 15:21:41 [name] ([id number]) -------------------------------------------------------------------------------- HPI Details Patient Name: [name] Date of Service: 06/11/2015 2:15 PM Medical Record Number: [id number] Patient Account Number: [id number] Date of Birth/Sex: 12-May-1956 (59 y.o. Female) Treating RN: Ahmed Prima Primary Care Physician: [name] Other Clinician: Referring Physician: [name] Treating Physician/Extender: Frann Rider in Treatment: 37 History of Present Illness HPI Description: 59yo w/ h/o BLE phlebolymphedema, type 2 DM (unknown hemoglobin A1c), and obesity. No PVD. h/o chronic, recurrent BLE calf ulcers. Treated with 4 layer compression. Infrequently uses lymphedema pump. Bilateral lower extremity ulcerations healed as of June 2016. Fitted for custom compression stockings but did not receive them. Patient could not travel for lymphedema consult. She developed recurrent bilateral lower extremity ulcerations in August 2016. She is able to heal these quite quickly with 4 layer elastic compression bandages. However, she is not very compliant using her juxtalite compression garments and tends to develop recurrent  ulcerations while using juxtalite. Unna boots were applied this past week. She tolerated this well but performs minimal ambulation. She is scheduled to undergo total hip replacement tomorrow with Dr. Rudene Christians. She is unsure if he is aware of her ulcerations. She is otherwise without complaints. No significant pain. No fever or chills. Moderate clear drainage. Not on any antibiotics. 06/04/2015 -- she did have her hip surgery and has now been in rehabilitation and they're controlling her fluid intake and diuretics to the extent that she has lost 13 pounds of weight and her lower extremity circumference is decreased by 7 cm. Electronic Signature(s) Signed: 06/11/2015 3:21:56 PM By: Christin Fudge MD, FACS Entered By: Christin Fudge on 06/11/2015 15:21:56 [name] ([id number]) -------------------------------------------------------------------------------- Physical Exam Details Patient Name: [name] Date of Service: 06/11/2015 2:15 PM Medical Record Number: [id number] Patient Account Number: [id number] Date of Birth/Sex: 1956-02-03 (59 y.o. Female) Treating RN: Ahmed Prima Primary Care Physician: [name] Other Clinician: Referring Physician: [name] Treating Physician/Extender: Frann Rider in Treatment: 115 Constitutional . Pulse regular. Respirations normal and unlabored. Afebrile. . Eyes Nonicteric. Reactive to light. Ears, Nose, Mouth, and Throat Lips, teeth, and gums WNL.Marland Kitchen Moist mucosa without lesions . Neck supple and nontender. No palpable supraclavicular or cervical adenopathy. Normal sized without goiter. Respiratory WNL. No retractions.. Breath sounds WNL, No rubs, rales, rhonchi, or wheeze.. Chest Breasts symmetical and no nipple discharge.. Breast tissue WNL, no masses, lumps, or tenderness.. Lymphatic No adneopathy. No adenopathy. No adenopathy. Musculoskeletal Adexa without tenderness or enlargement.. Digits and nails w/o  clubbing, cyanosis, infection, petechiae, ischemia, or inflammatory conditions.. Integumentary (Hair, Skin) No suspicious lesions. No crepitus or fluctuance. No peri-wound warmth or erythema. No masses.Marland Kitchen Psychiatric Judgement and insight Intact.. No evidence of depression, anxiety, or agitation.. Notes the right lower extremity is completely healed. The left lower extremity has an ulcerated area in the left lateral region and the edema has gone down significantly. Electronic Signature(s) Signed: 06/11/2015 3:22:41 PM By: Christin Fudge MD, FACS Entered By: Christin Fudge  on 06/11/2015 15:22:40 FARA, BONTA ([id number]) -------------------------------------------------------------------------------- Physician Orders Details Patient Name: [name] Date of Service: 06/11/2015 2:15 PM Medical Record Number: [id number] Patient Account Number: [id number] Date of Birth/Sex: Apr 05, 1956 (59 y.o. Female) Treating RN: Carolyne Fiscal, Debi Primary Care Physician: [name] Other Clinician: Referring Physician: [name] Treating Physician/Extender: Frann Rider in Treatment: 115 Verbal / Phone Orders: Yes Clinician: Carolyne Fiscal, Debi Read Back and Verified: Yes Diagnosis Coding ICD-9 Coding Code Description 457.1 Other Lymphedema - excludes congenital, eyelid or vulva 459.33 Chronic venous hypertension with ulcer and inflammation 707.12 Ulcer of lower limbs, except pressure ulcer; Ulcer of calf ICD-10 Coding Code Description I89.0 Lymphedema, not elsewhere classified E66.9 Obesity, unspecified E11.40 Type 2 diabetes mellitus with diabetic neuropathy, unspecified R26.89 Other abnormalities of gait and mobility 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 I83.222 Varicose veins of left lower extremity with both ulcer of calf and inflammation E11.622 Type 2 diabetes mellitus with other skin ulcer L97.221 Non-pressure chronic  ulcer of left calf limited to breakdown of skin Wound Cleansing Wound #56 Left,Circumferential Lower Leg o Cleanse wound with mild soap and water o Cleanse wound with mild soap and water Wound #56 Left,Medial Lower Leg o Cleanse wound with mild soap and water o Cleanse wound with mild soap and water Wound #58 Right,Circumferential Lower Leg o Cleanse wound with mild soap and water Anesthetic Wound #56 Left,Circumferential Lower Leg o Topical Lidocaine 4% cream applied to wound bed prior to debridement o Topical Lidocaine 4% cream applied to wound bed prior to debridement Wound #56 Left,Medial Lower Leg Klimaszewski, Sharlette K. ([id number]) o Topical Lidocaine 4% cream applied to wound bed prior to debridement o Topical Lidocaine 4% cream applied to wound bed prior to debridement Wound #58 Right,Circumferential Lower Leg o Topical Lidocaine 4% cream applied to wound bed prior to debridement Primary Wound Dressing Wound #56 Left,Circumferential Lower Leg o Aquacel Ag o Aquacel Ag Wound #56 Left,Medial Lower Leg o Aquacel Ag o Aquacel Ag Secondary Dressing Wound #56 Left,Circumferential Lower Leg o ABD pad o ABD pad Wound #56 Left,Medial Lower Leg o ABD pad o ABD pad Dressing Change Frequency Wound #56 Left,Circumferential Lower Leg o Change dressing every week o Change dressing every week Wound #56 Left,Medial Lower Leg o Change dressing every week o Change dressing every week Follow-up Appointments Wound #56 Left,Circumferential Lower Leg o Return Appointment in 1 week. o Return Appointment in 1 week. Wound #56 Left,Medial Lower Leg o Return Appointment in 1 week. o Return Appointment in 1 week. Edema Control o Other: - Juxtalite for right leg Wound #56 Left,Circumferential Lower Leg o 4 Layer Compression System - Bilateral o 4 Layer Compression System - Bilateral Hubers, Zarah K. ([id number]) Wound #56  Left,Medial Lower Leg o 4 Layer Compression System - Bilateral o 4 Layer Compression System - Bilateral Electronic Signature(s) Signed: 06/11/2015 4:24:56 PM By: Christin Fudge MD, FACS Signed: 06/11/2015 5:40:37 PM By: Alric Quan Entered By: Alric Quan on 06/11/2015 15:43:23 [name] ([id number]) -------------------------------------------------------------------------------- Problem List Details Patient Name: [name]. Date of Service: 06/11/2015 2:15 PM Medical Record Number: [id number] Patient Account Number: [id number] Date of Birth/Sex: February 26, 1956 (59 y.o. Female) Treating RN: Ahmed Prima Primary Care Physician: [name] Other Clinician: Referring Physician: [name] Treating Physician/Extender: Frann Rider in [id number] Active Problems ICD-9 Encounter Code Description Active Date Diagnosis 457.1 Other Lymphedema - excludes congenital, eyelid or vulva 03/27/2013 Yes 459.33 Chronic venous hypertension with ulcer  and inflammation 03/27/2013 Yes 707.12 Ulcer of lower limbs, except pressure ulcer; Ulcer of calf 03/27/2013 Yes ICD-10 Encounter Code Description Active Date Diagnosis I89.0 Lymphedema, not elsewhere classified 03/26/2014 Yes E66.9 Obesity, unspecified 03/26/2014 Yes E11.40 Type 2 diabetes mellitus with diabetic neuropathy, 01/14/2015 Yes unspecified R26.89 Other abnormalities of gait and mobility 01/14/2015 Yes 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 02/25/2015 Yes calf and inflammation I83.222 Varicose veins of left lower extremity with both ulcer of 11/12/2014 Yes calf and inflammation Sabine, Amaris K. ([id number]) E11.622 Type 2 diabetes mellitus with other skin ulcer 02/25/2015 Yes L97.221 Non-pressure chronic ulcer of left calf limited to 05/12/2015 Yes breakdown of skin Inactive Problems Resolved Problems ICD-10 Code Description Active Date Resolved Date E11.621 Type 2  diabetes mellitus with foot ulcer 10/08/2014 10/09/2014 L03.115 Cellulitis of right lower limb 02/25/2015 02/25/2015 L84 Corns and callosities 11/12/2014 11/12/2014 Electronic Signature(s) Signed: 06/11/2015 3:20:34 PM By: Christin Fudge MD, FACS Entered By: Christin Fudge on 06/11/2015 15:20:34 [name] ([id number]) -------------------------------------------------------------------------------- Progress Note Details Patient Name: [name]. Date of Service: 06/11/2015 2:15 PM Medical Record Number: [id number] Patient Account Number: [id number] Date of Birth/Sex: [birth year] (59 y.o. 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 History of Present Illness (HPI) 59yo w/ h/o BLE phlebolymphedema, type 2 DM (unknown hemoglobin A1c), and obesity. No PVD. h/o chronic, recurrent BLE calf ulcers. Treated with 4 layer compression. Infrequently uses lymphedema pump. Bilateral lower extremity ulcerations healed as of June 2016. Fitted for custom compression stockings but did not receive them. Patient could not travel for lymphedema consult. She developed recurrent bilateral lower extremity ulcerations in August 2016. She is able to heal these quite quickly with 4 layer elastic compression bandages. However, she is not very compliant using her juxtalite compression garments and tends to develop recurrent ulcerations while using juxtalite. Unna boots were applied this past week. She tolerated this well but performs minimal ambulation. She is scheduled to undergo total hip replacement tomorrow with Dr. Rudene Christians. She is unsure if he is aware of her ulcerations. She is otherwise without complaints. No significant  pain. No fever or chills. Moderate clear drainage. Not on any antibiotics. 06/04/2015 -- she did have her hip surgery and has now been in rehabilitation and they're controlling her fluid intake and diuretics to the extent that she has lost 13 pounds of weight and her lower extremity circumference is decreased by 7 cm. Objective Constitutional Pulse regular. Respirations normal and unlabored. Afebrile. Vitals Time Taken: 2:41 PM, Height: 65 in, Temperature: 97.8 F, Pulse: 55 bpm, Respiratory Rate: 18 breaths/min, Blood Pressure: 134/38 mmHg. KORRYN, MONTANARI ([id number]) Eyes Nonicteric. Reactive to light. Ears, Nose, Mouth, and Throat Lips, teeth, and gums WNL.Marland Kitchen Moist mucosa without lesions . Neck supple and nontender. No palpable supraclavicular or cervical adenopathy. Normal sized without goiter. Respiratory WNL. No retractions.. Breath sounds WNL, No rubs, rales, rhonchi, or wheeze.. Chest Breasts symmetical and no nipple discharge.. Breast tissue WNL, no masses, lumps, or tenderness.. Lymphatic No adneopathy. No adenopathy. No adenopathy. Musculoskeletal Adexa without tenderness or enlargement.. Digits and nails w/o clubbing, cyanosis, infection, petechiae, ischemia, or inflammatory conditions.Marland Kitchen Psychiatric Judgement and insight Intact.. No evidence of depression, anxiety, or agitation.. General Notes: the right lower extremity is completely healed. The left lower extremity has an ulcerated area in  the left lateral region and the edema has gone down significantly. Integumentary (Hair, Skin) No suspicious lesions. No crepitus or fluctuance. No peri-wound warmth or erythema. No masses.. Wound #56 status is Open. Original cause of wound was Gradually Appeared. The wound is located on the Left,Medial Lower Leg. The wound measures 2.3cm length x 0.3cm width x 0.1cm depth; 0.542cm^2 area and 0.054cm^3 volume. The wound is limited to skin breakdown. There is no tunneling or  undermining noted. There is a medium amount of serosanguineous drainage noted. The wound margin is distinct with the outline attached to the wound base. There is medium (34-66%) red, pink granulation within the wound bed. There is no necrotic tissue within the wound bed. The periwound skin appearance exhibited: Dry/Scaly, Moist. The periwound skin appearance did not exhibit: Maceration. The periwound has tenderness on palpation. Wound #58 status is Healed - Epithelialized. Original cause of wound was Blister. The wound is located on the Right,Circumferential Lower Leg. The wound measures 0cm length x 0cm width x 0cm depth; 0cm^2 area and 0cm^3 volume. The wound is limited to skin breakdown. There is no tunneling or undermining noted. There is a none present amount of drainage noted. The wound margin is indistinct and nonvisible. There is no granulation within the wound bed. There is no necrotic tissue within the wound bed. The periwound skin appearance exhibited: Localized Edema. The periwound skin appearance did not exhibit: Callus, Crepitus, Excoriation, Fluctuance, Friable, Induration, Rash, Scarring, Dry/Scaly, Maceration, Moist, Malloy, Latoya K. ([id number]) Atrophie Blanche, Cyanosis, Ecchymosis, Hemosiderin Staining, Mottled, Pallor, Rubor, Erythema. 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 I83.222 - Varicose veins of left lower extremity with both ulcer of calf and inflammation E11.622 - Type 2 diabetes  mellitus with other skin ulcer L97.221 - Non-pressure chronic ulcer of left calf limited to breakdown of skin For her right lower extremity have asked her to use her compression wraps and use them all day except for the nighttime. n her left lower extremity I have recommended we continue with silver alginate and a 4-layer Profore wrap. She is also encouraged to elevate and exercise as permitted by her rehabilitation facility. She will come back and see me next week. Plan Wound Cleansing: Wound #56 Left,Circumferential Lower Leg: Cleanse wound with mild soap and water Cleanse wound with mild soap and water Wound #56 Left,Medial Lower Leg: Cleanse wound with mild soap and water Cleanse wound with mild soap and water Wound #58 Right,Circumferential Lower Leg: Burnsed, Taura K. ([id number]) Cleanse wound with mild soap and water Anesthetic: Wound #56 Left,Circumferential Lower Leg: Topical Lidocaine 4% cream applied to wound bed prior to debridement Topical Lidocaine 4% cream applied to wound bed prior to debridement Wound #56 Left,Medial Lower Leg: Topical Lidocaine 4% cream applied to wound bed prior to debridement Topical Lidocaine 4% cream applied to wound bed prior to debridement Wound #58 Right,Circumferential Lower Leg: Topical Lidocaine 4% cream applied to wound bed prior to debridement Primary Wound Dressing: Wound #56 Left,Circumferential Lower Leg: Aquacel Ag Aquacel Ag Wound #56 Left,Medial Lower Leg: Aquacel Ag Aquacel Ag Secondary Dressing: Wound #  85 Left,Circumferential Lower Leg: ABD pad ABD pad Wound #56 Left,Medial Lower Leg: ABD pad ABD pad Dressing Change Frequency: Wound #56 Left,Circumferential Lower Leg: Change dressing every week Change dressing every week Wound #56 Left,Medial Lower Leg: Change dressing every week Change dressing every week Follow-up Appointments: Wound #56 Left,Circumferential Lower Leg: Return Appointment in 1 week. Return  Appointment in 1 week. Wound #56 Left,Medial Lower Leg: Return Appointment in 1 week. Return Appointment in 1 week. Edema Control: Other: - Juxtalite for right leg Wound #56 Left,Circumferential Lower Leg: 4 Layer Compression System - Bilateral 4 Layer Compression System - Bilateral Wound #56 Left,Medial Lower Leg: 4 Layer Compression System - Bilateral 4 Layer Compression System - Bilateral Streater, Florie K. ([id number]) For her right lower extremity have asked her to use her compression wraps and use them all day except for the nighttime. n her left lower extremity I have recommended we continue with silver alginate and a 4-layer Profore wrap. She is also encouraged to elevate and exercise as permitted by her rehabilitation facility. She will come back and see me next week. Electronic Signature(s) Signed: 06/11/2015 4:26:10 PM By: Christin Fudge MD, FACS Previous Signature: 06/11/2015 3:23:39 PM Version By: Christin Fudge MD, FACS Entered By: Christin Fudge on 06/11/2015 16:26:10 [name] ([id number]) -------------------------------------------------------------------------------- SuperBill Details Patient Name: [name] Date of Service: 06/11/2015 Medical Record Number: [id number] Patient Account Number: [id number] Date of Birth/Sex: 05-27-1956 (59 y.o. Female) Treating RN: Ahmed Prima Primary Care Physician: [name] Other Clinician: Referring Physician: [name] Treating Physician/Extender: Frann Rider in Treatment: 115 Diagnosis Coding ICD-9 Codes Code Description 457.1 Other Lymphedema - excludes congenital, eyelid or vulva 459.33 Chronic venous hypertension with ulcer and inflammation 707.12 Ulcer of lower limbs, except pressure ulcer; Ulcer of calf ICD-10 Codes Code Description I89.0 Lymphedema, not elsewhere classified E66.9 Obesity, unspecified E11.40 Type 2 diabetes mellitus with diabetic neuropathy, unspecified R26.89  Other abnormalities of gait and mobility 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 I83.222 Varicose veins of left lower extremity with both ulcer of calf and inflammation E11.622 Type 2 diabetes mellitus with other skin ulcer L97.221 Non-pressure chronic ulcer of left calf limited to breakdown of skin Facility Procedures CPT4: Description Modifier Quantity Code [id number] (Facility Use Only) [phone] - [place] [facility] 1 LEG Physician Procedures CPT4: Description Modifier Quantity Code [id number] - WC PHYS LEVEL 3 - EST PT 1 ICD-10 Description Diagnosis I89.0 Lymphedema, not elsewhere classified I83.222 Varicose veins of left lower extremity with both ulcer of calf and inflammation E11.40  Type 2 diabetes mellitus with diabetic neuropathy, unspecified 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

## 2015-06-12 NOTE — Progress Notes (Signed)
KEKOA, BLUHM ([id number]) Visit Report for 06/11/2015 Arrival Information Details Patient Name: [name], [name]. Date of Service: 06/11/2015 2:15 PM Medical Record Number: [id number] Patient Account Number: [id number] Date of Birth/Sex: 05-Jan-1956 (59 y.o. Female) Treating RN: Ahmed Prima Primary Care Physician: [name] Other Clinician: Referring Physician: [name] Treating Physician/Extender: Frann Rider in Treatment: 64 Visit Information History Since Last Visit All ordered tests and consults were completed: No Patient Arrived: Wheel Chair Added or deleted any medications: No Arrival Time: 14:39 Any new allergies or adverse reactions: No Accompanied By: self Had a fall or experienced change in No Transfer Assistance: EasyPivot Patient activities of daily living that may affect Lift risk of falls: Patient Identification Verified: Yes Signs or symptoms of abuse/neglect since last No Secondary Verification Process Yes visito Completed: Hospitalized since last visit: No Patient Requires Transmission- No Pain Present Now: No Based Precautions: Patient Has Alerts: Yes Patient Alerts: Patient on Blood Thinner Electronic Signature(s) Signed: 06/11/2015 5:40:37 PM By: Alric Quan Entered By: Alric Quan on 06/11/2015 [place], Dorrington K. ([id number]) -------------------------------------------------------------------------------- Encounter Discharge Information Details Patient Name: [name]. Date of Service: 06/11/2015 2:15 PM Medical Record Number: [id number] Patient Account Number: [id number] Date of Birth/Sex: [birth year] (59 y.o. Female) Treating RN: Ahmed Prima Primary Care Physician: [name] Other Clinician: Referring Physician: [name] Treating Physician/Extender: Frann Rider in Treatment: 115 Encounter Discharge Information Items Discharge Pain Level: 0 Discharge Condition:  Stable Ambulatory Status: Wheelchair Discharge Destination: Home Transportation: Other Accompanied By: self Schedule Follow-up Appointment: Yes Medication Reconciliation completed and provided to Patient/Care Yes Calena [place]: Provided on Clinical Summary of Care: 06/11/2015 Form Type Recipient Paper Patient GI Electronic Signature(s) Signed: 06/11/2015 3:51:13 PM By: Ruthine Dose Entered By: Ruthine Dose on 06/11/2015 15:51:13 [name] ([id number]) -------------------------------------------------------------------------------- Lower Extremity Assessment Details Patient Name: [name] Date of Service: 06/11/2015 2:15 PM Medical Record Number: [id number] Patient Account Number: [id number] Date of Birth/Sex: 1956/03/03 (59 y.o. Female) Treating RN: Ahmed Prima Primary Care Physician: [name] Other Clinician: Referring Physician: [name] Treating Physician/Extender: Frann Rider in Treatment: 115 Edema Assessment Assessed: Shirlyn Goltz: No] [Right: No] E[Left: dema] [Right: :] Calf Left: Right: Point of Measurement: cm From Medial Instep 38.5 cm 35 cm Ankle Left: Right: Point of Measurement: cm From Medial Instep 23 cm 22.2 cm Vascular Assessment Pulses: Posterior Tibial Dorsalis Pedis Palpable: [Left:No] [Right:No] Doppler: [Left:Monophasic] [Right:Monophasic] Extremity colors, hair growth, and conditions: Extremity Color: [Left:Mottled] [Right:Mottled] Hair Growth on Extremity: [Left:No] [Right:No] Temperature of Extremity: [Left:Warm] [Right:Warm] Capillary Refill: [Left:< 3 seconds] [Right:< 3 seconds] Toe Nail Assessment Left: Right: Thick: Yes Yes Discolored: Yes Yes Deformed: No No Improper Length and Hygiene: No No Electronic Signature(s) Signed: 06/11/2015 5:40:37 PM By: Alric Quan Entered By: Alric Quan on 06/11/2015 14:57:45 [name] ([id number]) [name], [name]  ([id number]) -------------------------------------------------------------------------------- Multi Wound Chart Details Patient Name: [name]. Date of Service: 06/11/2015 2:15 PM Medical Record Number: [id number] Patient Account Number: [id number] Date of Birth/Sex: 01-19-1956 (59 y.o. 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] [58:Lymphedema] [N/A:N/A] Secondary Etiology: [56:N/A] [58:Diabetic Wound/Ulcer  of the Lower Extremity] [N/A:N/A] Comorbid History: [56:Lymphedema, Hypertension, Type II Diabetes] [58:Lymphedema, Hypertension, Type II Diabetes] [N/A:N/A] Date Acquired: [56:05/04/2015] [58:05/15/2015] [N/A:N/A] Weeks of Treatment: [56:4] [58:3] [N/A:N/A] Wound Status: [56:Open] [58:Open] [N/A:N/A] Clustered Wound: [56:Yes] [58:Yes] [N/A:N/A] Measurements L x W x D 2.3x0.3x0.1 [58:5x4x0.1] [N/A:N/A] (cm) Area (cm) : [56:0.542] [58:15.708] [N/A:N/A] Volume (cm) : [56:0.054] [58:1.571] [N/A:N/A] % Reduction in Area: [56:95.80%] [58:83.30%] [N/A:N/A] % Reduction in Volume: 95.80% [58:83.30%] [N/A:N/A] Classification: [56:Grade 1] [58:Partial Thickness] [N/A:N/A] HBO Classification: [56:N/A] [58:Grade 1] [N/A:N/A] Exudate Amount: [56:Medium] [58:Medium] [N/A:N/A] Exudate Type: [56:Serosanguineous] [58:Serosanguineous] [N/A:N/A] Exudate Color: [56:red, brown] [58:red, brown]  [N/A:N/A] Wound Margin: [56:Distinct, outline attached] [58:Indistinct, nonvisible] [N/A:N/A] Granulation Amount: [56:Medium (34-66%)] [58:Medium (34-66%)] [N/A:N/A] Granulation Quality: [56:Red, Pink] [58:Red, Pink] [N/A:N/A] Necrotic Amount: [56:None Present (0%)] [58:None Present (0%)] [N/A:N/A] Exposed Structures: Fascia: No Fascia: No N/A Fat: No Fat: No Tendon: No Tendon: No Muscle: No Muscle: No Joint: No Joint: No Bone: No Bone: No Limited to Skin Limited to Skin Breakdown Breakdown Epithelialization: None None N/A Periwound Skin Texture: No Abnormalities Noted Edema: Yes N/A Scarring: Yes Excoriation: No Induration: No Callus: No Crepitus: No Fluctuance: No Friable: No Rash: No Periwound Skin Moist: Yes Moist: Yes N/A Moisture: Dry/Scaly: Yes Dry/Scaly: Yes Maceration: No Maceration: No Periwound Skin Color: No Abnormalities Noted Atrophie Blanche: No N/A Cyanosis: No Ecchymosis: No Erythema: No Hemosiderin Staining: No Mottled: No Pallor: No Rubor: No Tenderness on Yes No N/A Palpation: Wound Preparation: Ulcer Cleansing: Other: Ulcer Cleansing: Other: N/A soap and water soap and water Topical Anesthetic Topical Anesthetic Applied: Other: lidocaine Applied: None 4% Treatment Notes Electronic Signature(s) Signed: 06/11/2015 5:40:37 PM By: Alric Quan Entered By: Alric Quan on 06/11/2015 15:40:44 [name] ([id number]) -------------------------------------------------------------------------------- Multi-Disciplinary Care Plan Details Patient Name: [name] Date of Service: 06/11/2015 2:15 PM Medical Record Number: [id number] Patient Account Number: [id number] Date of Birth/Sex: 11/10/1955 (59 y.o. Female) Treating RN: Ahmed Prima Primary Care Physician: [name] Other Clinician: Referring Physician: [name] Treating Physician/Extender: Frann Rider in Treatment: 115 Active  Inactive Nutrition Nursing Diagnoses: Impaired glucose control: actual or potential Goals: Patient/caregiver verbalizes understanding of need to maintain therapeutic glucose control per primary care physician Date Initiated: 12/16/2013 Goal Status: Active Interventions: Provide education on elevated blood sugars and impact on wound healing Notes: Orientation to the Wound Care Program Nursing Diagnoses: Knowledge deficit related to the wound healing center program Goals: Patient/caregiver will verbalize understanding of the [facility] Date Initiated: 04/03/2013 Goal Status: Active Interventions: Provide education on orientation to the wound center Notes: Soft Tissue Infection Nursing Diagnoses: Impaired tissue integrity Goals: Patient will remain free of wound infection MARLEAN, SNOWBERGER ([id number]) Date Initiated: 12/16/2013 Goal Status: Active Interventions: Assess signs and symptoms of infection every visit Notes: Venous Leg Ulcer Nursing Diagnoses: Knowledge deficit related to disease process and management Potential for venous Insuffiency (use before diagnosis confirmed) Goals: Patient will maintain optimal edema control Date Initiated: 08/18/2014 Goal Status: Active Interventions: Assess peripheral edema status every visit. Compression as ordered Treatment Activities: Therapeutic compression applied : 06/11/2015 Notes: Electronic Signature(s) Signed: 06/11/2015 5:40:37 PM By: Alric Quan Entered By: Alric Quan on 06/11/2015 15:40:33 [name] ([id number]) -------------------------------------------------------------------------------- Pain Assessment Details Patient Name: [name]. Date of Service: 06/11/2015 2:15 PM Medical Record Number: [id number] Patient Account Number: [id number] Date of Birth/Sex: [birth year] (59 y.o. Female) Treating RN: Ahmed Prima Primary Care Physician: [name] Other  Clinician: Referring Physician: [name] Treating Physician/Extender: Frann Rider in Treatment: [phone]  Active Problems Location of Pain Severity and Description of Pain Patient Has Paino No Site Locations Pain Management and Medication Current Pain Management: Electronic Signature(s) Signed: 06/11/2015 5:40:37 PM By: Alric Quan Entered By: Alric Quan on 06/11/2015 14:41:17 [name] ([id number]) -------------------------------------------------------------------------------- Patient/Caregiver Education Details Patient Name: [name]. Date of Service: 06/11/2015 2:15 PM Medical Record Number: [id number] Patient Account Number: [id number] Date of Birth/Gender: Jun 11, 1956 (59 y.o. Female) Treating RN: Ahmed Prima Primary Care Physician: [name] Other Clinician: Referring Physician: [name] Treating Physician/Extender: Frann Rider in Treatment: 115 Education Assessment Education Provided To: Patient Education Topics Provided Wound/Skin Impairment: Handouts: Other: do not get wrap wet Methods: Demonstration, Explain/Verbal Responses: State content correctly Electronic Signature(s) Signed: 06/11/2015 5:40:37 PM By: Alric Quan Entered By: Alric Quan on 06/11/2015 15:49:53 [name] ([id number]) -------------------------------------------------------------------------------- Wound Assessment Details Patient Name: [name] Date of Service: 06/11/2015 2:15 PM Medical Record Number: [id number] Patient Account Number: [id number] Date of Birth/Sex: [birth year] (59 y.o. Female) Treating RN: Carolyne Fiscal, Debi Primary Care Physician: [name] Other Clinician: Referring Physician: [name] Treating Physician/Extender: Frann Rider in Treatment: 115 Wound Status Wound Number: 56 Primary Diabetic Wound/Ulcer of the Lower Etiology: Extremity Wound Location: Left Lower Leg -  Medial Wound Status: Open Wounding Event: Gradually Appeared Comorbid Lymphedema, Hypertension, Type II Date Acquired: 05/04/2015 History: Diabetes Weeks Of Treatment: 4 Clustered Wound: Yes Photos Photo Uploaded By: Alric Quan on 06/11/2015 16:48:05 Wound Measurements Length: (cm) 2.3 Width: (cm) 0.3 Depth: (cm) 0.1 Area: (cm) 0.542 Volume: (cm) 0.054 % Reduction in Area: 95.8% % Reduction in Volume: 95.8% Epithelialization: None Tunneling: No Undermining: No Wound Description Classification: Grade 1 Wound Margin: Distinct, outline attached Exudate Amount: Medium Exudate Type: Serosanguineous Exudate Color: red, brown Foul Odor After Cleansing: No Wound Bed Granulation Amount: Medium (34-66%) Exposed Structure Granulation Quality: Red, Pink Fascia Exposed: No Necrotic Amount: None Present (0%) Fat Layer Exposed: No Tendon Exposed: No Jenning, Taylie K. ([id number]) Muscle Exposed: No Joint Exposed: No Bone Exposed: No Limited to Skin Breakdown Periwound Skin Texture Texture Color No Abnormalities Noted: No No Abnormalities Noted: No Moisture Temperature / Pain No Abnormalities Noted: No Tenderness on Palpation: Yes Dry / Scaly: Yes Maceration: No Moist: Yes Wound Preparation Ulcer Cleansing: Other: soap and water, Topical Anesthetic Applied: Other: lidocaine 4%, Treatment Notes Wound #56 (Left, Medial Lower Leg) 1. Cleansed with: Clean wound with Normal Saline 2. Anesthetic Topical Lidocaine 4% cream to wound bed prior to debridement 4. Dressing Applied: Aquacel Ag 7. Secured with 4-Layer Compression System - Left Lower Extremity Notes juxtalite ro right leg Electronic Signature(s) Signed: 06/11/2015 5:40:37 PM By: Alric Quan Entered By: Alric Quan on 06/11/2015 15:04:24 [name] ([id number]) -------------------------------------------------------------------------------- Wound Assessment Details Patient Name:  [name]. Date of Service: 06/11/2015 2:15 PM Medical Record Number: [id number] Patient Account Number: [id number] Date of Birth/Sex: [birth year] (59 y.o. Female) Treating RN: Ahmed Prima Primary Care Physician: [name] Other Clinician: Referring Physician: [name] Treating Physician/Extender: Frann Rider in Treatment: 115 Wound Status Wound Number: 58 Primary Lymphedema Etiology: Wound Location: Right Lower Leg - Circumfernential Secondary Diabetic Wound/Ulcer of the Lower Etiology: Extremity Wounding Event: Blister Wound Status: Healed - Epithelialized Date Acquired: 05/15/2015 Comorbid Lymphedema, Hypertension, Type Weeks Of Treatment: 3 History: II Diabetes Clustered Wound: Yes Photos Photo Uploaded By: Alric Quan on 06/11/2015 16:48:05 Wound Measurements Length: (cm) 0 % Reduct Width: (cm) 0 % Reduct Depth: (cm) 0 Epitheli Area: (cm) 0 Tunneli Volume: (cm) 0 Undermi  ion in Area: 100% ion in Volume: 100% alization: Large (67-100%) ng: No ning: No Wound Description Classification: Partial Thickness Diabetic Severity (Wagner): Grade 1 Wound Margin: Indistinct, nonvisib Exudate Amount: None Present Foul Odor After Cleansing: No le Wound Bed Granulation Amount: None Present (0%) Exposed Structure Necrotic Amount: None Present (0%) Fascia Exposed: No Fat Layer Exposed: No Tendon Exposed: No Lauro, Leisel K. ([id number]) Muscle Exposed: No Joint Exposed: No Bone Exposed: No Limited to Skin Breakdown Periwound Skin Texture Texture Color No Abnormalities Noted: No No Abnormalities Noted: No Callus: No Atrophie Blanche: No Crepitus: No Cyanosis: No Excoriation: No Ecchymosis: No Fluctuance: No Erythema: No Friable: No Hemosiderin Staining: No Induration: No Mottled: No Localized Edema: Yes Pallor: No Rash: No Rubor: No Scarring: No Moisture No Abnormalities Noted: No Dry / Scaly: No Maceration:  No Moist: No Wound Preparation Ulcer Cleansing: Other: soap and water, Topical Anesthetic Applied: None Electronic Signature(s) Signed: 06/11/2015 5:40:37 PM By: Alric Quan Entered By: Alric Quan on 06/11/2015 15:47:56 [name] ([id number]) -------------------------------------------------------------------------------- Vitals Details Patient Name: [name]. Date of Service: 06/11/2015 2:15 PM Medical Record Number: [id number] Patient Account Number: [id number] Date of Birth/Sex: 27-Jun-1956 (59 y.o. Female) Treating RN: Carolyne Fiscal, Debi Primary Care Physician: [name] Other Clinician: Referring Physician: [name] Treating Physician/Extender: Frann Rider in Treatment: 115 Vital Signs Time Taken: 14:41 Temperature (F): 97.8 Height (in): 65 Pulse (bpm): 55 Respiratory Rate (breaths/min): 18 Blood Pressure (mmHg): 134/38 Reference Range: 80 - 120 mg / dl Electronic Signature(s) Signed: 06/11/2015 5:40:37 PM By: Alric Quan Entered By: Alric Quan on 06/11/2015 14:43:24

## 2015-06-13 DIAGNOSIS — E119 Type 2 diabetes mellitus without complications: Secondary | ICD-10-CM | POA: Diagnosis not present

## 2015-06-13 LAB — GLUCOSE, CAPILLARY
GLUCOSE-CAPILLARY: 159 mg/dL — AB (ref 65–99)
GLUCOSE-CAPILLARY: 247 mg/dL — AB (ref 65–99)
GLUCOSE-CAPILLARY: 217 mg/dL — AB (ref 65–99)

## 2015-06-14 DIAGNOSIS — E119 Type 2 diabetes mellitus without complications: Secondary | ICD-10-CM | POA: Diagnosis not present

## 2015-06-14 LAB — GLUCOSE, CAPILLARY
GLUCOSE-CAPILLARY: 145 mg/dL — AB (ref 65–99)
GLUCOSE-CAPILLARY: 140 mg/dL — AB (ref 65–99)
Glucose-Capillary: 296 mg/dL — ABNORMAL HIGH (ref 65–99)

## 2015-06-15 DIAGNOSIS — E119 Type 2 diabetes mellitus without complications: Secondary | ICD-10-CM | POA: Diagnosis not present

## 2015-06-15 LAB — GLUCOSE, CAPILLARY
GLUCOSE-CAPILLARY: 246 mg/dL — AB (ref 65–99)
GLUCOSE-CAPILLARY: 159 mg/dL — AB (ref 65–99)
GLUCOSE-CAPILLARY: 214 mg/dL — AB (ref 65–99)
Glucose-Capillary: 230 mg/dL — ABNORMAL HIGH (ref 65–99)
Glucose-Capillary: 200 mg/dL — ABNORMAL HIGH (ref 65–99)

## 2015-06-16 DIAGNOSIS — E119 Type 2 diabetes mellitus without complications: Secondary | ICD-10-CM | POA: Diagnosis not present

## 2015-06-16 LAB — GLUCOSE, CAPILLARY
GLUCOSE-CAPILLARY: 177 mg/dL — AB (ref 65–99)
GLUCOSE-CAPILLARY: 268 mg/dL — AB (ref 65–99)
Glucose-Capillary: 144 mg/dL — ABNORMAL HIGH (ref 65–99)
Glucose-Capillary: 257 mg/dL — ABNORMAL HIGH (ref 65–99)

## 2015-06-17 ENCOUNTER — Other Ambulatory Visit: Payer: Self-pay | Admitting: Orthopedic Surgery

## 2015-06-17 DIAGNOSIS — R29898 Other symptoms and signs involving the musculoskeletal system: Secondary | ICD-10-CM

## 2015-06-17 DIAGNOSIS — M21372 Foot drop, left foot: Secondary | ICD-10-CM

## 2015-06-18 ENCOUNTER — Encounter: Payer: Managed Care, Other (non HMO) | Admitting: Surgery

## 2015-06-18 DIAGNOSIS — E11622 Type 2 diabetes mellitus with other skin ulcer: Secondary | ICD-10-CM | POA: Diagnosis not present

## 2015-06-19 DIAGNOSIS — E119 Type 2 diabetes mellitus without complications: Secondary | ICD-10-CM | POA: Diagnosis not present

## 2015-06-19 LAB — GLUCOSE, CAPILLARY
GLUCOSE-CAPILLARY: 157 mg/dL — AB (ref 65–99)
GLUCOSE-CAPILLARY: 261 mg/dL — AB (ref 65–99)
GLUCOSE-CAPILLARY: 200 mg/dL — AB (ref 65–99)
GLUCOSE-CAPILLARY: 151 mg/dL — AB (ref 65–99)
Glucose-Capillary: 159 mg/dL — ABNORMAL HIGH (ref 65–99)
Glucose-Capillary: 169 mg/dL — ABNORMAL HIGH (ref 65–99)
Glucose-Capillary: 262 mg/dL — ABNORMAL HIGH (ref 65–99)
Glucose-Capillary: 134 mg/dL — ABNORMAL HIGH (ref 65–99)
Glucose-Capillary: 217 mg/dL — ABNORMAL HIGH (ref 65–99)

## 2015-06-19 NOTE — Progress Notes (Addendum)
[name], [name] ([id number]) Visit Report for 06/18/2015 Chief Complaint Document Details Patient Name: [name]. Date of Service: 06/18/2015 3:00 PM Medical Record Number: [id number] Patient Account Number: [id number] Date of Birth/Sex: Jun 24, 1956 (59 y.o. Female) Treating RN: Baruch Gouty, RN, BSN, Velva Harman Primary Care Physician: [name] Other Clinician: Referring Physician: [name] Treating Physician/Extender: Frann Rider in Treatment: 116 Information Obtained from: Patient Chief Complaint Bilateral lower extremity phlebolymphedema and recurrent calf ulcerations. Electronic Signature(s) Signed: 06/18/2015 3:45:38 PM By: Christin Fudge MD, FACS Entered By: Christin Fudge on 06/18/2015 15:45:38 [name] ([id number]) -------------------------------------------------------------------------------- HPI Details Patient Name: [name] Date of Service: 06/18/2015 3:00 PM Medical Record Number: [id number] Patient Account Number: [id number] Date of Birth/Sex: Oct 25, 1955 (59 y.o. Female) Treating RN: Baruch Gouty, RN, BSN, Velva Harman Primary Care Physician: [name] Other Clinician: Referring Physician: [name] Treating Physician/Extender: Frann Rider in Treatment: 116 History of Present Illness HPI Description: 58yo w/ h/o BLE phlebolymphedema, type 2 DM (unknown hemoglobin A1c), and obesity. No PVD. h/o chronic, recurrent BLE calf ulcers. Treated with 4 layer compression. Infrequently uses lymphedema pump. Bilateral lower extremity ulcerations healed as of June 2016. Fitted for custom compression stockings but did not receive them. Patient could not travel for lymphedema consult. She developed recurrent bilateral lower extremity ulcerations in August 2016. She is able to heal these quite quickly with 4 layer elastic compression bandages. However, she is not very compliant using her juxtalite compression garments and tends to develop  recurrent ulcerations while using juxtalite. Unna boots were applied this past week. She tolerated this well but performs minimal ambulation. She is scheduled to undergo total hip replacement tomorrow with Dr. Rudene Christians. She is unsure if he is aware of her ulcerations. She is otherwise without complaints. No significant pain. No fever or chills. Moderate clear drainage. Not on any antibiotics. 06/04/2015 -- she did have her hip surgery and has now been in rehabilitation and they're controlling her fluid intake and diuretics to the extent that she has lost 13 pounds of weight and her lower extremity circumference is decreased by 7 cm. 06/18/2015 -- she is still in the rehabilitation facility and continues to be looked after well and has lost a total of 15 pounds and her diabetes is under much better control. Electronic Signature(s) Signed: 06/18/2015 3:46:09 PM By: Christin Fudge MD, FACS Entered By: Christin Fudge on 06/18/2015 15:46:09 [name] ([id number]) -------------------------------------------------------------------------------- Physical Exam Details Patient Name: [name] Date of Service: 06/18/2015 3:00 PM Medical Record Number: [id number] Patient Account Number: [id number] Date of Birth/Sex: [birth year] (59 y.o. Female) Treating RN: Baruch Gouty, RN, BSN, Velva Harman Primary Care Physician: [name] Other Clinician: Referring Physician: [name] Treating Physician/Extender: Frann Rider in Treatment: 116 Constitutional . Pulse regular. Respirations normal and unlabored. Afebrile. . Eyes Nonicteric. Reactive to light. Ears, Nose, Mouth, and Throat Lips, teeth, and gums WNL.Marland Kitchen Moist mucosa without lesions. Neck supple and nontender. No palpable supraclavicular or cervical adenopathy. Normal sized without goiter. Respiratory WNL. No retractions.. Cardiovascular Pedal Pulses WNL. No clubbing, cyanosis or edema. Gastrointestinal (GI) Abdomen without masses  or tenderness.. No liver or spleen enlargement or tenderness.. Genitourinary (GU) No hydrocele, spermatocele, tenderness of the cord, or testicular mass.Marland Kitchen Penis without lesions.[name] without lesions. No cystocele, or rectocele. Pelvic support intact, no discharge.Marland Kitchen Urethra without masses, tenderness or scarring.Marland Kitchen Lymphatic No adneopathy. No adenopathy. No adenopathy. Musculoskeletal Adexa without tenderness or enlargement.. Digits and nails w/o clubbing, cyanosis, infection, petechiae, ischemia, or inflammatory conditions.. Integumentary (Hair, Skin)  No suspicious lesions. No crepitus or fluctuance. No peri-wound warmth or erythema. No masses.Marland Kitchen Psychiatric Judgement and insight Intact.. No evidence of depression, anxiety, or agitation.. Notes right lower symmetry continues to do well and is remaining well healed with compression stockings of the juxta light variety. The left lower extremity has minimal ulcerations laterally and we will continue local care and compression. Electronic Signature(s) Signed: 06/18/2015 3:46:59 PM By: Christin Fudge MD, FACS [name] ([id number]) Entered By: Christin Fudge on 06/18/2015 15:46:59 [name] ([id number]) -------------------------------------------------------------------------------- Physician Orders Details Patient Name: [name] Date of Service: 06/18/2015 3:00 PM Medical Record Number: [id number] Patient Account Number: [id number] Date of Birth/Sex: [birth year] (59 y.o. Female) Treating RN: Afful, RN, BSN, Velva Harman Primary Care Physician: [name] Other Clinician: Referring Physician: [name] Treating Physician/Extender: Frann Rider in Treatment: [id number] Verbal / Phone Orders: Yes Clinician: Afful, RN, BSN, Rita Read Back and Verified: Yes Diagnosis Coding Wound Cleansing Wound #56 Left,Medial Lower Leg o Cleanse wound with mild soap and water Anesthetic Wound #56 Left,Medial Lower  Leg o Topical Lidocaine 4% cream applied to wound bed prior to debridement Primary Wound Dressing Wound #56 Left,Medial Lower Leg o Prisma Ag Secondary Dressing Wound #56 Left,Medial Lower Leg o ABD pad Dressing Change Frequency Wound #56 Left,Medial Lower Leg o Change dressing every week Follow-up Appointments Wound #56 Left,Medial Lower Leg o Return Appointment in 1 week. Edema Control Wound #56 Left,Medial Lower Leg o 4-Layer Compression System - Left Lower Extremity o Patient to wear own Juxtalite/Juzo compression garment. - on right leg Electronic Signature(s) Signed: 06/18/2015 4:29:20 PM By: Regan Lemming BSN, RN Signed: 06/18/2015 5:05:23 PM By: Christin Fudge MD, FACS Entered By: Regan Lemming on 06/18/2015 15:49:38 [name] ([name] ([id number]) -------------------------------------------------------------------------------- Problem List Details Patient Name: [name]. Date of Service: 06/18/2015 3:00 PM Medical Record Number: [id number] Patient Account Number: [id number] Date of Birth/Sex: July 18, 1955 (59 y.o. Female) Treating RN: Afful, RN, BSN, [place] Primary Care Physician: [name] Other Clinician: Referring Physician: [name] Treating Physician/Extender: Frann Rider in [id number] Active Problems ICD-9 Encounter Code Description Active Date Diagnosis 457.1 Other Lymphedema - excludes congenital, eyelid or vulva 03/27/2013 Yes 459.33 Chronic venous hypertension with ulcer and inflammation 03/27/2013 Yes 707.12 Ulcer of lower limbs, except pressure ulcer; Ulcer of calf 03/27/2013 Yes ICD-10 Encounter Code Description Active Date Diagnosis I89.0 Lymphedema, not elsewhere classified 03/26/2014 Yes E66.9 Obesity, unspecified 03/26/2014 Yes E11.40 Type 2 diabetes mellitus with diabetic neuropathy, 01/14/2015 Yes unspecified R26.89 Other abnormalities of gait and mobility 01/14/2015 Yes I83.212  Varicose veins of right lower extremity with both ulcer of 02/25/2015 Yes calf and inflammation I83.222 Varicose veins of left lower extremity with both ulcer of 11/12/2014 Yes calf and inflammation [name], [name] ([id number]) E11.622 Type 2 diabetes mellitus with other skin ulcer 02/25/2015 Yes L97.221 Non-pressure chronic ulcer of left calf limited to 05/12/2015 Yes breakdown of skin Inactive Problems Resolved Problems ICD-10 Code Description Active Date Resolved Date E11.621 Type 2 diabetes mellitus with foot ulcer 10/08/2014 10/09/2014 L03.115 Cellulitis of right lower limb 02/25/2015 02/25/2015 L84 Corns and callosities 11/12/2014 11/12/2014 Electronic Signature(s) Signed: 06/18/2015 3:45:29 PM By: Christin Fudge MD, FACS Entered By: Christin Fudge on 06/18/2015 15:45:29 [name] ([id number]) -------------------------------------------------------------------------------- Progress Note Details Patient Name: [name]. Date of Service: 06/18/2015 3:00 PM Medical Record Number: [id number] Patient Account Number: [id number] Date of Birth/Sex: May 28, 1956 (59 y.o. Female) Treating RN: Afful, RN, BSN, Velva Harman Primary Care Physician: Ronnald Ramp,  Deanna Other Clinician: Referring Physician: [name] Treating Physician/Extender: Frann Rider in Treatment: 116 Subjective Chief Complaint Information obtained from Patient Bilateral lower extremity phlebolymphedema and recurrent calf ulcerations. History of Present Illness (HPI) 59yo w/ h/o BLE phlebolymphedema, type 2 DM (unknown hemoglobin A1c), and obesity. No PVD. h/o chronic, recurrent BLE calf ulcers. Treated with 4 layer compression. Infrequently uses lymphedema pump. Bilateral lower extremity ulcerations healed as of June 2016. Fitted for custom compression stockings but did not receive them. Patient could not travel for lymphedema consult. She developed recurrent bilateral lower extremity ulcerations in August  2016. She is able to heal these quite quickly with 4 layer elastic compression bandages. However, she is not very compliant using her juxtalite compression garments and tends to develop recurrent ulcerations while using juxtalite. Unna boots were applied this past week. She tolerated this well but performs minimal ambulation. She is scheduled to undergo total hip replacement tomorrow with Dr. Rudene Christians. She is unsure if he is aware of her ulcerations. She is otherwise without complaints. No significant pain. No fever or chills. Moderate clear drainage. Not on any antibiotics. 06/04/2015 -- she did have her hip surgery and has now been in rehabilitation and they're controlling her fluid intake and diuretics to the extent that she has lost 13 pounds of weight and her lower extremity circumference is decreased by 7 cm. 06/18/2015 -- she is still in the rehabilitation facility and continues to be looked after well and has lost a total of 15 pounds and her diabetes is under much better control. Objective Constitutional Pulse regular. Respirations normal and unlabored. Afebrile. Vitals Time Taken: 2:52 PM, Height: 65 in, Temperature: 98.4 F, Pulse: 66 bpm, Respiratory Rate: 19 Pung, [name] ([id number]) breaths/min, Blood Pressure: 168/70 mmHg. Eyes Nonicteric. Reactive to light. Ears, Nose, Mouth, and Throat Lips, teeth, and gums WNL.Marland Kitchen Moist mucosa without lesions. Neck supple and nontender. No palpable supraclavicular or cervical adenopathy. Normal sized without goiter. Respiratory WNL. No retractions.. Cardiovascular Pedal Pulses WNL. No clubbing, cyanosis or edema. Gastrointestinal (GI) Abdomen without masses or tenderness.. No liver or spleen enlargement or tenderness.. Genitourinary (GU) No hydrocele, spermatocele, tenderness of the cord, or testicular mass.Marland Kitchen Penis without lesions.[name] without lesions. No cystocele, or rectocele. Pelvic support intact, no discharge.Marland Kitchen Urethra  without masses, tenderness or scarring.Marland Kitchen Lymphatic No adneopathy. No adenopathy. No adenopathy. Musculoskeletal Adexa without tenderness or enlargement.. Digits and nails w/o clubbing, cyanosis, infection, petechiae, ischemia, or inflammatory conditions.Marland Kitchen Psychiatric Judgement and insight Intact.. No evidence of depression, anxiety, or agitation.. General Notes: right lower symmetry continues to do well and is remaining well healed with compression stockings of the juxta light variety. The left lower extremity has minimal ulcerations laterally and we will continue local care and compression. Integumentary (Hair, Skin) No suspicious lesions. No crepitus or fluctuance. No peri-wound warmth or erythema. No masses.. Wound #56 status is Open. Original cause of wound was Gradually Appeared. The wound is located on the Left,Medial Lower Leg. The wound measures 1cm length x 0.3cm width x 0.1cm depth; 0.236cm^2 area and 0.024cm^3 volume. The wound is limited to skin breakdown. There is no tunneling or undermining noted. There is a small amount of serosanguineous drainage noted. The wound margin is distinct with the outline attached to the wound base. There is medium (34-66%) red, pink granulation within the wound bed. There is no necrotic tissue within the wound bed. The periwound skin appearance exhibited: Dry/Scaly. The periwound skin appearance did not exhibit: Callus, Crepitus, Excoriation, Fluctuance, Friable, Induration,  [name], [name] ([id number]) Localized Edema, Rash, Scarring, Maceration, Moist, Atrophie Blanche, Cyanosis, Ecchymosis, Hemosiderin Staining, Mottled, Pallor, Rubor, Erythema. The periwound has tenderness on palpation. 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 On her left lower extremity I have recommended we continue with silver alginate and a 4-layer Profore wrap. She is also encouraged to elevate and exercise as permitted by her rehabilitation facility. She will come back and see me next week. Plan Wound Cleansing: Wound #56 Left,Medial Lower Leg: Cleanse wound with mild soap and water Anesthetic: Wound #56 Left,Medial Lower Leg: Topical Lidocaine 4% cream applied to wound bed prior to debridement Primary Wound Dressing: Wound #56 Left,Medial Lower Leg: [name], [name] ([id number]) Prisma Ag Secondary Dressing: Wound #56 Left,Medial Lower Leg: ABD pad Dressing Change Frequency: Wound #56 Left,Medial Lower Leg: Change dressing every week Follow-up Appointments: Wound #56 Left,Medial Lower Leg: Return Appointment in 1 week. Edema Control: Wound #56 Left,Medial Lower Leg: 4-Layer Compression System - Left Lower Extremity Patient to wear own Juxtalite/Juzo compression garment. - on right leg For her right lower extremity have asked her to use her Juxta-lite compression wraps and use them all day except for the nighttime. On her left lower extremity I have recommended we continue with silver alginate and a 4-layer Profore wrap. She is also encouraged to elevate and exercise as permitted by her  rehabilitation facility. She will come back and see me next week. Electronic Signature(s) Signed: 06/18/2015 5:18:49 PM By: Christin Fudge MD, FACS Previous Signature: 06/18/2015 3:47:51 PM Version By: Christin Fudge MD, FACS Entered By: Christin Fudge on 06/18/2015 17:18:49 [name] ([id number]) -------------------------------------------------------------------------------- SuperBill Details Patient Name: [name] Date of Service: 06/18/2015 Medical Record Number: [id number] Patient Account Number: [id number] Date of Birth/Sex: 05-03-1956 (59 y.o. Female) Treating RN: Afful, RN, BSN, Velva Harman Primary Care Physician: [name] Other Clinician: Referring Physician: [name] Treating Physician/Extender: Frann Rider in Treatment: 116 Diagnosis Coding ICD-9 Codes Code Description 457.1 Other Lymphedema - excludes congenital, eyelid or vulva 459.33 Chronic venous hypertension with ulcer and inflammation 707.12 Ulcer of lower limbs, except pressure ulcer; Ulcer of calf ICD-10 Codes Code Description I89.0 Lymphedema, not elsewhere classified E66.9 Obesity, unspecified E11.40 Type 2 diabetes mellitus with diabetic neuropathy, unspecified R26.89 Other abnormalities of gait and mobility 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 I83.222 Varicose veins of left lower extremity with both ulcer of calf and inflammation E11.622 Type 2 diabetes mellitus with other skin ulcer L97.221 Non-pressure chronic ulcer of left calf limited to breakdown of skin Facility Procedures CPT4: Description Modifier Quantity Code [id number] (Facility Use Only) [id number] - [facility] COMPRS LWR LT 1 LEG Physician Procedures CPT4: Description Modifier Quantity Code  [id number] - WC PHYS LEVEL 3 - EST PT 1 ICD-10 Description Diagnosis I89.0 Lymphedema, not elsewhere classified E11.40 Type 2 diabetes mellitus with diabetic neuropathy,  unspecified I83.212 Varicose veins of  right lower extremity with both ulcer of calf and inflammation I83.222 Varicose veins of left lower extremity with both ulcer of calf and inflammation CHRISTL, ALCARAZ ([id number]) Electronic Signature(s) Signed: 06/18/2015 5:30:06 PM By: Christin Fudge MD, FACS Signed: 06/18/2015 6:21:38 PM By: Gretta Cool RN, BSN, Kim RN, BSN Previous Signature: 06/18/2015 3:48:08 PM Version By: Christin Fudge MD, FACS Entered By: Gretta Cool RN, BSN, Kim on 06/18/2015 17:27:27

## 2015-06-19 NOTE — Progress Notes (Signed)
GORDON, KATHOL ([id number]) Visit Report for 06/18/2015 Arrival Information Details Patient Name: [name], [name]. Date of Service: 06/18/2015 3:00 PM Medical Record Number: [id number] Patient Account Number: [id number] Date of Birth/Sex: 1955/10/16 (59 y.o. Female) Treating RN: Afful, RN, BSN, Velva Harman Primary Care Physician: [name] Other Clinician: Referring Physician: [name] Treating Physician/Extender: Frann Rider in Treatment: 38 Visit Information History Since Last Visit Any new allergies or adverse reactions: No Patient Arrived: Wheel Chair Had a fall or experienced change in No Arrival Time: 14:50 activities of daily living that may affect Accompanied By: self risk of falls: Transfer Assistance: None Signs or symptoms of abuse/neglect since last No Patient Identification Verified: Yes visito Secondary Verification Process Yes Hospitalized since last visit: No Completed: Has Dressing in Place as Prescribed: Yes Patient Requires Transmission- No Has Compression in Place as Prescribed: Yes Based Precautions: Pain Present Now: No Patient Has Alerts: Yes Patient Alerts: Patient on Blood Thinner Electronic Signature(s) Signed: 06/18/2015 4:29:20 PM By: Regan Lemming BSN, RN Entered By: Regan Lemming on 06/18/2015 14:50:48 [name] ([id number]) -------------------------------------------------------------------------------- Encounter Discharge Information Details Patient Name: [name]. Date of Service: 06/18/2015 3:00 PM Medical Record Number: [id number] Patient Account Number: [id number] Date of Birth/Sex: 05-24-1956 (59 y.o. Female) Treating RN: Baruch Gouty, RN, BSN, Velva Harman Primary Care Physician: [name] Other Clinician: Referring Physician: [name] Treating Physician/Extender: Frann Rider in Treatment: [phone] Encounter Discharge Information Items Discharge Pain Level: 0 Discharge Condition: Stable Ambulatory  Status: Wheelchair Discharge Destination: Nursing Home Transportation: Other Accompanied By: self Schedule Follow-up Appointment: No Medication Reconciliation completed and provided to Patient/Care No Jamina Macbeth: Provided on Clinical Summary of Care: 06/18/2015 Form Type Recipient Paper Patient GI Electronic Signature(s) Signed: 06/18/2015 4:29:20 PM By: Regan Lemming BSN, RN Previous Signature: 06/18/2015 3:16:57 PM Version By: Ruthine Dose Entered By: Regan Lemming on 06/18/2015 15:45:50 [name] ([id number]) -------------------------------------------------------------------------------- Lower Extremity Assessment Details Patient Name: [name]. Date of Service: 06/18/2015 3:00 PM Medical Record Number: [id number] Patient Account Number: [id number] Date of Birth/Sex: [birth year] (59 y.o. Female) Treating RN: Afful, RN, BSN, [place] Primary Care Physician: [name] Other Clinician: Referring Physician: [name] Treating Physician/Extender: Frann Rider in Treatment: 116 Edema Assessment Assessed: Shirlyn Goltz: No] [Right: No] E[Left: dema] [Right: :] Calf Left: Right: Point of Measurement: 24 cm From Medial Instep 38.6 cm 35.4 cm Ankle Left: Right: Point of Measurement: 8 cm From Medial Instep 23 cm 22.6 cm Vascular Assessment Claudication: Claudication Assessment [Left:None] [Right:None] Pulses: Posterior Tibial Dorsalis Pedis Palpable: [Left:Yes] [Right:Yes] Extremity colors, hair growth, and conditions: Extremity Color: [Left:Mottled] [Right:Mottled] Hair Growth on Extremity: [Left:No] [Right:No] Temperature of Extremity: [Left:Warm] [Right:Warm] Capillary Refill: [Left:< 3 seconds] [Right:< 3 seconds] Toe Nail Assessment Left: Right: Thick: Yes Yes Discolored: Yes Yes Deformed: No No Improper Length and Hygiene: No No Electronic Signature(s) Signed: 06/18/2015 4:29:20 PM By: Regan Lemming BSN, RN Entered By: Regan Lemming on 06/18/2015  14:54:01 [name] ([name], [name] ([id number]) -------------------------------------------------------------------------------- Multi Wound Chart Details Patient Name: [name]. Date of Service: 06/18/2015 3:00 PM Medical Record Number: [id number] Patient Account Number: [id number] Date of Birth/Sex: 07/09/1955 (59 y.o. 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] [N/A:N/A] Comorbid History: [56:Lymphedema, Hypertension, Type II Diabetes] [N/A:N/A] Date Acquired: [56:05/04/2015] [N/A:N/A] Weeks of Treatment: [56:5] [N/A:N/A] Wound Status: [56:Open] [N/A:N/A] Clustered Wound: [56:Yes] [N/A:N/A] Measurements L x W x D 1x0.3x0.1 [N/A:N/A] (cm) Area (cm) : [56:0.236] [N/A:N/A] Volume (cm) : [56:0.024] [N/A:N/A] % Reduction in Area: [56:98.20%] [N/A:N/A] % Reduction in Volume: 98.10% [N/A:N/A] Classification: [56:Grade 1] [N/A:N/A] Exudate Amount: [56:Small] [N/A:N/A] Exudate Type: [56:Serosanguineous] [N/A:N/A] Exudate Color: [56:red, brown] [N/A:N/A] Wound Margin: [56:Distinct, outline attached] [N/A:N/A] Granulation Amount: [56:Medium (34-66%)] [N/A:N/A] Granulation Quality: [56:Red, Pink] [N/A:N/A] Necrotic Amount: [56:None Present (0%)] [N/A:N/A] Exposed Structures: [56:Fascia: No Fat: No Tendon: No Muscle: No] [N/A:N/A] Joint: No Bone: No Limited to Skin Breakdown Epithelialization: None N/A N/A Periwound Skin  Texture: Edema: No N/A N/A Excoriation: No Induration: No Callus: No Crepitus: No Fluctuance: No Friable: No Rash: No Scarring: No Periwound Skin Dry/Scaly: Yes N/A N/A Moisture: Maceration: No Moist: No Periwound Skin Color: Atrophie Blanche: No N/A N/A Cyanosis: No Ecchymosis: No Erythema: No Hemosiderin Staining: No Mottled: No Pallor: No Rubor: No Tenderness on Yes N/A N/A Palpation: Wound Preparation: Ulcer Cleansing: Other: N/A N/A soap and water Topical Anesthetic Applied: Other: lidocaine 4% Treatment Notes Electronic Signature(s) Signed: 06/18/2015 4:29:20 PM By: Regan Lemming BSN, RN Entered By: Regan Lemming on 06/18/2015 15:18:25 [name] ([id number]) -------------------------------------------------------------------------------- Multi-Disciplinary Care Plan Details Patient Name: [name] Date of Service: 06/18/2015 3:00 PM Medical Record Number: [id number] Patient Account Number: [id number] Date of Birth/Sex: Jul 29, 1955 (59 y.o. Female) Treating RN: Afful, RN, BSN, Velva Harman Primary Care Physician: [name] Other Clinician: Referring Physician: [name] Treating Physician/Extender: Frann Rider in Treatment: 116 Active Inactive Nutrition Nursing Diagnoses: Impaired glucose control: actual or potential Goals: Patient/caregiver verbalizes understanding of need to maintain therapeutic glucose control per primary care physician Date Initiated: 12/16/2013 Goal Status: Active Interventions: Provide education on elevated blood sugars and impact on wound healing Notes: Orientation to the Wound Care Program Nursing Diagnoses: Knowledge deficit related to the wound healing center program Goals: Patient/caregiver will verbalize understanding of the [place] [facility] Date Initiated: 04/03/2013 Goal Status: Active Interventions: Provide education on orientation to the wound center Notes: Soft Tissue  Infection Nursing Diagnoses: Impaired tissue integrity Goals: Patient will remain free of wound infection KRYSLYNN, LUCKENBILL ([id number]) Date Initiated: 12/16/2013 Goal Status: Active Interventions: Assess signs and symptoms of infection every visit Notes: Venous Leg Ulcer Nursing Diagnoses: Knowledge deficit related to disease process and management Potential for venous Insuffiency (use before diagnosis confirmed) Goals: Patient will maintain optimal edema control Date Initiated: 08/18/2014 Goal Status: Active Interventions: Assess peripheral edema status every visit. Compression as ordered Treatment Activities: Therapeutic compression applied : 06/18/2015 Notes: Electronic Signature(s) Signed: 06/18/2015 4:29:20 PM By: Regan Lemming BSN, RN Entered By: Regan Lemming on 06/18/2015 15:18:16 [name] ([id number]) -------------------------------------------------------------------------------- Pain Assessment Details Patient Name: [name]. Date of Service: 06/18/2015 3:00 PM Medical Record Number: [id number] Patient Account Number: [id number] Date of Birth/Sex: 11/14/1955 (59 y.o. Female) Treating RN: Baruch Gouty, RN, BSN, Velva Harman Primary Care Physician: [name] Other Clinician: Referring Physician: [name] Treating Physician/Extender: Frann Rider in Treatment: 116 Active Problems Location of Pain Severity and Description of Pain Patient Has Paino No Site Locations Pain Management and Medication Current Pain Management: Electronic Signature(s) Signed: 06/18/2015 4:29:20 PM By: Regan Lemming BSN, RN Entered By: Regan Lemming on 06/18/2015 14:50:56 [name] ([id number]) -------------------------------------------------------------------------------- Patient/Caregiver Education Details Patient Name: [name] Date  of Service: 06/18/2015 3:00 PM Medical Record Number: [id number] Patient Account Number: [id number] Date of  Birth/Gender: Aug 28, 1955 (59 y.o. Female) Treating RN: Baruch Gouty, RN, BSN, Velva Harman Primary Care Physician: [name] Other Clinician: Referring Physician: [name] Treating Physician/Extender: Frann Rider in Treatment: 116 Education Assessment Education Provided To: Patient Education Topics Provided Elevated Blood Sugar/ Impact on Healing: Methods: Explain/Verbal Responses: State content correctly Welcome To The [place]: Methods: Explain/Verbal Responses: State content correctly Electronic Signature(s) Signed: 06/18/2015 4:29:20 PM By: Regan Lemming BSN, RN Entered By: Regan Lemming on 06/18/2015 15:46:06 [name] ([id number]) -------------------------------------------------------------------------------- Wound Assessment Details Patient Name: [name] Date of Service: 06/18/2015 3:00 PM Medical Record Number: [id number] Patient Account Number: [id number] Date of Birth/Sex: [birth year] (59 y.o. Female) Treating RN: Afful, RN, BSN, [place] Primary Care Physician: [name] Other Clinician: Referring Physician: [name] Treating Physician/Extender: Frann Rider in Treatment: 116 Wound Status Wound Number: [id number] Primary Diabetic Wound/Ulcer of the Lower Etiology: Extremity Wound Location: Left Lower Leg - Medial Wound Status: Open Wounding Event: Gradually Appeared Comorbid Lymphedema, Hypertension, Type II Date Acquired: 05/04/2015 History: Diabetes Weeks Of Treatment: 5 Clustered Wound: Yes Photos Photo Uploaded By: Regan Lemming on 06/18/2015 16:27:04 Wound Measurements Length: (cm) 1 Width: (cm) 0.3 Depth: (cm) 0.1 Area: (cm) 0.236 Volume: (cm) 0.024 % Reduction in Area: 98.2% % Reduction in Volume: 98.1% Epithelialization: None Tunneling: No Undermining: No Wound Description Classification: Grade 1 Wound Margin: Distinct, outline attached Exudate Amount: Small Exudate Type: Serosanguineous Exudate Color: red,  brown Garzon, Tacoya K. ([id number]) Foul Odor After Cleansing: No Wound Bed Granulation Amount: Medium (34-66%) Exposed Structure Granulation Quality: Red, Pink Fascia Exposed: No Necrotic Amount: None Present (0%) Fat Layer Exposed: No Tendon Exposed: No Muscle Exposed: No Joint Exposed: No Bone Exposed: No Limited to Skin Breakdown Periwound Skin Texture Texture Color No Abnormalities Noted: No No Abnormalities Noted: No Callus: No Atrophie Blanche: No Crepitus: No Cyanosis: No Excoriation: No Ecchymosis: No Fluctuance: No Erythema: No Friable: No Hemosiderin Staining: No Induration: No Mottled: No Localized Edema: No Pallor: No Rash: No Rubor: No Scarring: No Temperature / Pain Moisture Tenderness on Palpation: Yes No Abnormalities Noted: No Dry / Scaly: Yes Maceration: No Moist: No Wound Preparation Ulcer Cleansing: Other: soap and water, Topical Anesthetic Applied: Other: lidocaine 4%, Electronic Signature(s) Signed: 06/18/2015 4:29:20 PM By: Regan Lemming BSN, RN Entered By: Regan Lemming on 06/18/2015 15:03:02 [name] ([id number]) -------------------------------------------------------------------------------- Wound Assessment Details Patient Name: [name]. Date of Service: 06/18/2015 3:00 PM Medical Record Number: [id number] Patient Account Number: [id number] Date of Birth/Sex: October 16, 1955 (59 y.o. Female) Treating RN: Afful, RN, BSN, [place] Primary Care Physician: [name] Other Clinician: Referring Physician: [name] Treating Physician/Extender: Frann Rider in Treatment: 116 Wound Status Wound Number: 56 Primary Diabetic Wound/Ulcer of the Lower Etiology: Extremity Wound Location: Left, Circumferential Lower Leg Wound Status: Healed - Epithelialized Wounding Event: Gradually Appeared Date Acquired: 05/04/2015 Weeks Of Treatment: 0 Clustered Wound: Yes Wound Measurements Length: (cm) Width: (cm) Depth:  (cm) Area: (cm) Volume: (cm) 0 % Reduction in Area: 0 % Reduction in Volume: 0 0 0 Periwound Skin Texture Texture Color No Abnormalities Noted: No No Abnormalities Noted: No Moisture No Abnormalities Noted: No Electronic Signature(s) Signed: 06/18/2015 4:29:20 PM By: Regan Lemming BSN, RN Entered By: Regan Lemming on 06/18/2015 15:47:35 [name] ([id number]) -------------------------------------------------------------------------------- Vitals Details Patient Name: [name]. Date of Service: 06/18/2015 3:00 PM Medical Record Number: [id number] Patient Account Number: [id number] Date  of Birth/Sex: April 12, 1956 (59 y.o. Female) Treating RN: Baruch Gouty, RN, BSN, [place] Primary Care Physician: [name] Other Clinician: Referring Physician: [name] Treating Physician/Extender: Frann Rider in Treatment: 116 Vital Signs Time Taken: 14:52 Temperature (F): 98.4 Height (in): 65 Pulse (bpm): 66 Respiratory Rate (breaths/min): 19 Blood Pressure (mmHg): 168/70 Reference Range: 80 - 120 mg / dl Electronic Signature(s) Signed: 06/18/2015 4:29:20 PM By: Regan Lemming BSN, RN Entered By: Regan Lemming on 06/18/2015 14:54:16

## 2015-06-20 DIAGNOSIS — E119 Type 2 diabetes mellitus without complications: Secondary | ICD-10-CM | POA: Diagnosis not present

## 2015-06-20 LAB — GLUCOSE, CAPILLARY
GLUCOSE-CAPILLARY: 243 mg/dL — AB (ref 65–99)
GLUCOSE-CAPILLARY: 136 mg/dL — AB (ref 65–99)
GLUCOSE-CAPILLARY: 141 mg/dL — AB (ref 65–99)

## 2015-06-22 DIAGNOSIS — E119 Type 2 diabetes mellitus without complications: Secondary | ICD-10-CM | POA: Diagnosis not present

## 2015-06-22 LAB — GLUCOSE, CAPILLARY
GLUCOSE-CAPILLARY: 158 mg/dL — AB (ref 65–99)
GLUCOSE-CAPILLARY: 276 mg/dL — AB (ref 65–99)
GLUCOSE-CAPILLARY: 154 mg/dL — AB (ref 65–99)
Glucose-Capillary: 272 mg/dL — ABNORMAL HIGH (ref 65–99)
Glucose-Capillary: 288 mg/dL — ABNORMAL HIGH (ref 65–99)
Glucose-Capillary: 140 mg/dL — ABNORMAL HIGH (ref 65–99)
Glucose-Capillary: 186 mg/dL — ABNORMAL HIGH (ref 65–99)

## 2015-06-24 LAB — GLUCOSE, CAPILLARY
GLUCOSE-CAPILLARY: 229 mg/dL — AB (ref 65–99)
GLUCOSE-CAPILLARY: 179 mg/dL — AB (ref 65–99)
GLUCOSE-CAPILLARY: 220 mg/dL — AB (ref 65–99)
GLUCOSE-CAPILLARY: 161 mg/dL — AB (ref 65–99)
Glucose-Capillary: 166 mg/dL — ABNORMAL HIGH (ref 65–99)
Glucose-Capillary: 163 mg/dL — ABNORMAL HIGH (ref 65–99)
Glucose-Capillary: 321 mg/dL — ABNORMAL HIGH (ref 65–99)
Glucose-Capillary: 180 mg/dL — ABNORMAL HIGH (ref 65–99)

## 2015-06-26 ENCOUNTER — Encounter: Payer: Self-pay | Admitting: General Surgery

## 2015-06-26 ENCOUNTER — Encounter (HOSPITAL_BASED_OUTPATIENT_CLINIC_OR_DEPARTMENT_OTHER): Payer: Managed Care, Other (non HMO) | Admitting: General Surgery

## 2015-06-26 DIAGNOSIS — I87313 Chronic venous hypertension (idiopathic) with ulcer of bilateral lower extremity: Secondary | ICD-10-CM | POA: Diagnosis not present

## 2015-06-26 DIAGNOSIS — E11622 Type 2 diabetes mellitus with other skin ulcer: Secondary | ICD-10-CM | POA: Diagnosis not present

## 2015-06-26 DIAGNOSIS — L97919 Non-pressure chronic ulcer of unspecified part of right lower leg with unspecified severity: Principal | ICD-10-CM

## 2015-06-26 DIAGNOSIS — L97929 Non-pressure chronic ulcer of unspecified part of left lower leg with unspecified severity: Principal | ICD-10-CM

## 2015-06-26 NOTE — Progress Notes (Signed)
See i heal 

## 2015-06-27 NOTE — Progress Notes (Addendum)
[name] ([id number]) Visit Report for 06/26/2015 Chief Complaint Document Details Patient Name: [name], [name] 06/26/2015 12:15 Date of Service: PM Medical Record [id number] Number: Patient Account Number: [id number] Sep 10, 1955 (59 y.o. 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 Electronic Signature(s) Signed: 06/26/2015 12:50:14 PM By: Judene Companion MD Entered By: Judene Companion on 06/26/2015 12:50:14 [name] ([id number]) -------------------------------------------------------------------------------- HPI Details Patient Name: [name] 06/26/2015 12:15 Date of Service: PM Medical Record [id number] Number: Patient Account Number: [id number] [birth year] (59 y.o. Treating RN: Ahmed Prima Date of Birth/Sex: Female) Other Clinician: Primary Care Physician: Leonia Corona, Rickayla Wieland Referring Physician: [name] Physician/Extender: Suella Grove in Treatment: 117 History of Present Illness HPI Description: 59yo w/ h/o BLE phlebolymphedema, type 2 DM (unknown hemoglobin A1c), and obesity. No PVD. h/o chronic, recurrent BLE calf ulcers. Treated with 4 layer compression. Infrequently uses lymphedema pump. Bilateral lower extremity ulcerations healed as of June 2016. Fitted for custom compression stockings but did not receive them. Patient could not travel for lymphedema consult. She developed recurrent bilateral lower extremity ulcerations in August 2016. She is able to heal these quite quickly with 4 layer elastic compression bandages. However, she is not very compliant using her juxtalite compression garments and tends to develop recurrent  ulcerations while using juxtalite. Unna boots were applied this past week. She tolerated this well but performs minimal ambulation. She is scheduled to undergo total hip replacement tomorrow with Dr. Rudene Christians. She is unsure if he is aware of her ulcerations. She is otherwise without complaints. No significant pain. No fever or chills. Moderate clear drainage. Not on any antibiotics. 06/04/2015 -- she did have her hip surgery and has now been in rehabilitation and they're controlling her fluid intake and diuretics to the extent that she has lost 13 pounds of weight and her lower extremity circumference is decreased by 7 cm. 06/18/2015 -- she is still in the rehabilitation facility and continues to be looked after well and has lost a total of 15 pounds and her diabetes is under much better control. Electronic Signature(s) Signed: 06/26/2015 12:50:52 PM By: Judene Companion MD Entered By: Judene Companion on 06/26/2015 12:50:52 [name] ([id number]) -------------------------------------------------------------------------------- Physical Exam Details Patient Name: [name], [name] 06/26/2015 12:15 Date of Service: PM Medical Record [id number] Number: Patient Account Number: [id number] 02/05/1956 (59 y.o. Treating RN: Ahmed Prima Date of Birth/Sex: Female) Other Clinician: Primary Care Physician: Leonia Corona, Zanaya Baize Referring Physician: [name] Physician/Extender: Suella Grove in Treatment: 117 Electronic Signature(s) Signed: 06/26/2015 12:51:03 PM By: Judene Companion MD Entered By: Judene Companion on 06/26/2015 12:51:03 [name] ([id number]) -------------------------------------------------------------------------------- Physician Orders Details Patient Name: [name], [name] 06/26/2015 12:15 Date of Service: PM Medical Record [id number] Number: Patient Account Number: [id number] 01/21/1956 (59 y.o. Treating RN: Ahmed Prima Date of  Birth/Sex: Female) Other Clinician: Primary Care Physician: Leonia Corona, Reza Crymes Referring Physician: [name] Physician/Extender: Suella Grove in Treatment: 117 Verbal / Phone Orders: Yes Clinician: Carolyne Fiscal, Debi Read Back and Verified: Yes Diagnosis Coding ICD-9 Coding Code Description 457.1 Other Lymphedema - excludes congenital, eyelid or vulva 459.33 Chronic venous hypertension with ulcer and inflammation 707.12 Ulcer of lower limbs, except pressure ulcer; Ulcer of calf ICD-10 Coding Code Description I89.0 Lymphedema, not elsewhere classified E66.9 Obesity, unspecified E11.40 Type 2 diabetes mellitus with  diabetic neuropathy, unspecified R26.89 Other abnormalities of gait and mobility 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 I83.222 Varicose veins of left lower extremity with both ulcer of calf and inflammation E11.622 Type 2 diabetes mellitus with other skin ulcer L97.221 Non-pressure chronic ulcer of left calf limited to breakdown of skin Wound Cleansing Wound #56 Left,Medial Lower Leg o Cleanse wound with mild soap and water Wound #59 Right,Circumferential Lower Leg o Cleanse wound with mild soap and water Anesthetic Wound #56 Left,Medial Lower Leg o Topical Lidocaine 4% cream applied to wound bed prior to debridement Wound #59 Right,Circumferential Lower Leg o Topical Lidocaine 4% cream applied to wound bed prior to debridement Primary Wound Dressing Maddison, Lesieli K. ([id number]) Wound #56 Left,Medial Lower Leg o Aquacel Ag Wound #59 Right,Circumferential Lower Leg o Aquacel Ag Secondary Dressing Wound #56 Left,Medial Lower Leg o ABD pad Wound #59 Right,Circumferential Lower Leg o ABD pad Dressing Change Frequency Wound #56 Left,Medial Lower Leg o Change dressing every week Wound #59 Right,Circumferential Lower Leg o Change dressing every week Follow-up  Appointments Wound #56 Left,Medial Lower Leg o Return Appointment in 1 week. Wound #59 Right,Circumferential Lower Leg o Return Appointment in 1 week. Edema Control Wound #56 Left,Medial Lower Leg o 4 Layer Compression System - Bilateral o Elevate legs to the level of the heart and pump ankles as often as possible Wound #59 Right,Circumferential Lower Leg o 4 Layer Compression System - Bilateral o Elevate legs to the level of the heart and pump ankles as often as possible Electronic Signature(s) Signed: 06/26/2015 1:09:45 PM By: Judene Companion MD Signed: 06/26/2015 1:51:49 PM By: Alric Quan Entered By: Alric Quan on 06/26/2015 13:05:01 [name] ([id number]) -------------------------------------------------------------------------------- Problem List Details Patient Name: [name] 06/26/2015 12:15 Date of Service: PM Medical Record [id number] Number: Patient Account Number: [id number] 1956/06/27 (59 y.o. Treating RN: Ahmed Prima Date of Birth/Sex: Female) Other Clinician: Primary Care Physician: Leonia Corona, [place] Referring Physician: [name] Physician/Extender: Suella Grove in Treatment: 117 Active Problems ICD-9 Encounter Code Description Active Date Diagnosis 457.1 Other Lymphedema - excludes congenital, eyelid or vulva 03/27/2013 Yes 459.33 Chronic venous hypertension with ulcer and inflammation 03/27/2013 Yes 707.12 Ulcer of lower limbs, except pressure ulcer; Ulcer of calf 03/27/2013 Yes ICD-10 Encounter Code Description Active Date Diagnosis I89.0 Lymphedema, not elsewhere classified 03/26/2014 Yes E66.9 Obesity, unspecified 03/26/2014 Yes E11.40 Type 2 diabetes mellitus with diabetic neuropathy, 01/14/2015 Yes unspecified R26.89 Other abnormalities of gait and mobility 01/14/2015 Yes 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 02/25/2015 Yes calf and inflammation I83.222 Varicose  veins of left lower extremity with both ulcer of 11/12/2014 Yes calf and inflammation Melkonian, Raylyn K. ([id number]) E11.622 Type 2 diabetes mellitus with other skin ulcer 02/25/2015 Yes L97.221 Non-pressure chronic ulcer of left calf limited to 05/12/2015 Yes breakdown of skin Inactive Problems Resolved Problems ICD-10 Code Description Active Date Resolved Date E11.621 Type 2 diabetes mellitus with foot ulcer 10/08/2014 10/09/2014 L03.115 Cellulitis of right lower limb 02/25/2015 02/25/2015 L84 Corns and callosities 11/12/2014 11/12/2014 Electronic Signature(s) Signed: 06/26/2015 12:50:04 PM By: Judene Companion MD Entered By: Judene Companion on 06/26/2015 12:50:04 [name] ([id number]) -------------------------------------------------------------------------------- Progress Note Details Patient Name: [name] 06/26/2015 12:15 Date of Service: PM Medical Record [id number] Number: Patient Account Number: [id number] 01/12/1956 (59 y.o. 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 History of Present Illness (HPI) 59yo w/ h/o BLE phlebolymphedema, type 2 DM (unknown hemoglobin A1c), and obesity. No PVD. h/o chronic, recurrent BLE calf ulcers. Treated with 4 layer compression. Infrequently uses lymphedema pump. Bilateral lower extremity ulcerations healed as of June 2016. Fitted for custom compression stockings but did not receive them. Patient could not travel for lymphedema consult. She developed recurrent bilateral lower extremity ulcerations in August 2016. She is able to heal these quite quickly with 4 layer elastic compression bandages. However, she is not very  compliant using her juxtalite compression garments and tends to develop recurrent ulcerations while using juxtalite. Unna boots were applied this past week. She tolerated this well but performs minimal ambulation. She is scheduled to undergo total hip replacement tomorrow with Dr. Rudene Christians. She is unsure if he is aware of her ulcerations. She is otherwise without complaints. No significant pain. No fever or chills. Moderate clear drainage. Not on any antibiotics. 06/04/2015 -- she did have her hip surgery and has now been in rehabilitation and they're controlling her fluid intake and diuretics to the extent that she has lost 13 pounds of weight and her lower extremity circumference is decreased by 7 cm. 06/18/2015 -- she is still in the rehabilitation facility and continues to be looked after well and has lost a total of 15 pounds and her diabetes is under much better control. Objective Constitutional Vitals Time Taken: 12:33 PM, Height: 65 in, Temperature: 97.9 F, Pulse: 63 bpm, Respiratory Rate: 18 [name], [name] ([id number]) breaths/min, Blood Pressure: 155/53 mmHg. Integumentary (Hair, Skin) Wound #59 status is Open. Original cause of wound was Blister. The wound is located on the Right,Circumferential Lower Leg. The wound is limited to skin breakdown. There is no tunneling or undermining noted. There is a medium amount of serous drainage noted. The wound margin is flat and intact. There is large (67-100%) red granulation within the wound bed. There is a small (1-33%) amount of necrotic tissue within the wound bed including Adherent Slough. The periwound skin appearance did not exhibit: Callus, Crepitus, Excoriation, Fluctuance, Friable, Induration, Localized Edema, Rash, Scarring, Dry/Scaly, Maceration, Moist, Atrophie Blanche, Cyanosis, Ecchymosis, Hemosiderin Staining, Mottled, Pallor, Rubor, Erythema. 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 Treating with compression and collagen. Has had right hip replaced and is losing some weight. Electronic Signature(s) Signed: 06/26/2015 12:53:38 PM By: Judene Companion MD Entered By: Judene Companion on 06/26/2015 12:53:38 [name] ([name] ([id number]) -------------------------------------------------------------------------------- SuperBill Details Patient Name: [name] Date of Service: 06/26/2015 Medical Record Number: [id number] Patient Account Number: [id number] Date of Birth/Sex: Dec 25, 1955 (59 y.o. Female) Treating RN: Ahmed Prima Primary Care Physician: [name] Other Clinician: Referring Physician: [name]  Treating Physician/Extender: Judene Companion Weeks in Treatment: 117 Diagnosis Coding ICD-9 Codes Code Description 457.1 Other Lymphedema - excludes congenital, eyelid or vulva 459.33 Chronic venous hypertension with ulcer and inflammation 707.12 Ulcer of lower limbs, except pressure ulcer; Ulcer of calf ICD-10 Codes Code Description I89.0 Lymphedema, not elsewhere classified E66.9 Obesity, unspecified E11.40 Type 2 diabetes mellitus with diabetic neuropathy, unspecified R26.89 Other  abnormalities of gait and mobility 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

## 2015-06-27 NOTE — Progress Notes (Signed)
OLIS, ROKOSZ ([id number]) Visit Report for 06/26/2015 Arrival Information Details Patient Name: [name], [name]. Date of Service: 06/26/2015 12:15 PM Medical Record Number: [id number] Patient Account Number: [id number] Date of Birth/Sex: 05-May-1956 (59 y.o. Female) Treating RN: Ahmed Prima Primary Care Physician: [name] Other Clinician: Referring Physician: [name] Treating Physician/Extender: Benjaman Pott in Treatment: 53 Visit Information History Since Last Visit All ordered tests and consults were completed: No Patient Arrived: Wheel Chair Added or deleted any medications: No Arrival Time: 12:32 Any new allergies or adverse reactions: No Accompanied By: self Had a fall or experienced change in No Transfer Assistance: EasyPivot Patient activities of daily living that may affect Lift risk of falls: Patient Identification Verified: Yes Signs or symptoms of abuse/neglect since last No Secondary Verification Process Yes visito Completed: Hospitalized since last visit: No Patient Requires Transmission- No Pain Present Now: No Based Precautions: Patient Has Alerts: Yes Patient Alerts: Patient on Blood Thinner Electronic Signature(s) Signed: 06/26/2015 1:51:49 PM By: Alric Quan Entered By: Alric Quan on 06/26/2015 12:33:01 [name] ([id number]) -------------------------------------------------------------------------------- Encounter Discharge Information Details Patient Name: [name]. Date of Service: 06/26/2015 12:15 PM Medical Record Number: [id number] Patient Account Number: [id number] Date of Birth/Sex: [birth year] (59 y.o. Female) Treating RN: Ahmed Prima Primary Care Physician: [name] Other Clinician: Referring Physician: [name] Treating Physician/Extender: Benjaman Pott in Treatment: 117 Encounter Discharge Information Items Discharge Pain Level: 0 Discharge Condition:  Stable Ambulatory Status: Wheelchair Discharge Destination: Home Transportation: Other Accompanied By: self Schedule Follow-up Appointment: Yes Medication Reconciliation completed and provided to Patient/Care Yes Melvinia Ashby: Provided on Clinical Summary of Care: 06/26/2015 Form Type Recipient Paper Patient gi Electronic Signature(s) Signed: 06/26/2015 1:33:54 PM By: Sharon Mt Previous Signature: 06/26/2015 12:55:16 PM Version By: Judene Companion MD Entered By: Sharon Mt on 06/26/2015 13:33:54 [name] ([id number]) -------------------------------------------------------------------------------- Lower Extremity Assessment Details Patient Name: [name]. Date of Service: 06/26/2015 12:15 PM Medical Record Number: [id number] Patient Account Number: [id number] Date of Birth/Sex: Oct 17, 1955 (59 y.o. Female) Treating RN: Carolyne Fiscal, Debi Primary Care Physician: [name] Other Clinician: Referring Physician: [name] Treating Physician/Extender: Judene Companion Weeks in Treatment: 117 Edema Assessment Assessed: [Left: No] [Right: No] Edema: [Left: Yes] [Right: Yes] Calf Left: Right: Point of Measurement: 24 cm From Medial Instep 40.2 cm 48.7 cm Ankle Left: Right: Point of Measurement: 8 cm From Medial Instep 22 cm 25.3 cm Vascular Assessment Pulses: Posterior Tibial Dorsalis Pedis Palpable: [Left:Yes] [Right:Yes] Extremity colors, hair growth, and conditions: Extremity Color: [Left:Hyperpigmented] [Right:Hyperpigmented] Hair Growth on Extremity: [Left:No] [Right:No] Temperature of Extremity: [Left:Warm] [Right:Warm] Capillary Refill: [Left:< 3 seconds] [Right:< 3 seconds] Toe Nail Assessment Left: Right: Thick: Yes Yes Discolored: Yes Yes Deformed: Yes Yes Improper Length and Hygiene: No No Electronic Signature(s) Signed: 06/26/2015 1:51:49 PM By: Alric Quan Entered By: Alric Quan on 06/26/2015 12:42:25 [name]  ([id number]) -------------------------------------------------------------------------------- Multi Wound Chart Details Patient Name: [name]. Date of Service: 06/26/2015 12:15 PM Medical Record Number: [id number] Patient Account Number: [id number] Date of Birth/Sex: 1955-08-10 (59 y.o. 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] [59:Lymphedema] [  N/A:N/A] Comorbid History: [56:Lymphedema, Hypertension, Type II Diabetes] [59:Lymphedema, Hypertension, Type II Diabetes] [N/A:N/A] Date Acquired: [56:05/04/2015] [59:06/23/2015] [N/A:N/A] Weeks of Treatment: [56:6] [59:0] [N/A:N/A] Wound Status: [56:Open] [59:Open] [N/A:N/A] Clustered Wound: [56:Yes] [59:No] [N/A:N/A] Measurements L x W x D 1.2x0.8x0.1 [59:12.5x29x0.1] [N/A:N/A] (cm) Area (cm) : [56:0.754] [id number] [N/A:N/A] Volume (cm) : [56:0.075] [59:28.471] [N/A:N/A] % Reduction in Area: [56:94.10%] [59:0.00%] [N/A:N/A] % Reduction in Volume: 94.20% [59:0.00%] [N/A:N/A] Classification: [56:Grade 1] [59:Partial Thickness] [N/A:N/A] HBO Classification: [56:N/A] [59:Grade 1] [N/A:N/A] Exudate Amount: [56:Small] [59:Medium] [N/A:N/A] Exudate Type: [56:Serosanguineous] [59:Serous] [N/A:N/A] Exudate Color: [56:red, brown] [59:amber] [N/A:N/A] Wound Margin: [56:Distinct, outline attached] [59:Flat and Intact] [N/A:N/A] Granulation Amount:  [56:Medium (34-66%)] [59:Large (67-100%)] [N/A:N/A] Granulation Quality: [56:Red, Pink] [59:Red] [N/A:N/A] Necrotic Amount: [56:None Present (0%)] [59:Small (1-33%)] [N/A:N/A] Exposed Structures: [56:Fascia: No Fat: No] [59:Fascia: No Fat: No] [N/A:N/A] Tendon: No Tendon: No Muscle: No Muscle: No Joint: No Joint: No Bone: No Bone: No Limited to Skin Limited to Skin Breakdown Breakdown Epithelialization: None Small (1-33%) N/A Periwound Skin Texture: Edema: No Edema: No N/A Excoriation: No Excoriation: No Induration: No Induration: No Callus: No Callus: No Crepitus: No Crepitus: No Fluctuance: No Fluctuance: No Friable: No Friable: No Rash: No Rash: No Scarring: No Scarring: No Periwound Skin Dry/Scaly: Yes Maceration: No N/A Moisture: Maceration: No Moist: No Moist: No Dry/Scaly: No Periwound Skin Color: Atrophie Blanche: No Atrophie Blanche: No N/A Cyanosis: No Cyanosis: No Ecchymosis: No Ecchymosis: No Erythema: No Erythema: No Hemosiderin Staining: No Hemosiderin Staining: No Mottled: No Mottled: No Pallor: No Pallor: No Rubor: No Rubor: No Tenderness on Yes No N/A Palpation: Wound Preparation: Ulcer Cleansing: Other: Ulcer Cleansing: Other: N/A soap and water soap and water Topical Anesthetic Topical Anesthetic Applied: Other: lidocaine Applied: None 4% Treatment Notes Electronic Signature(s) Signed: 06/26/2015 1:51:49 PM By: Alric Quan Entered By: Alric Quan on 06/26/2015 12:53:39 [name] ([id number]) -------------------------------------------------------------------------------- Multi-Disciplinary Care Plan Details Patient Name: [name] Date of Service: 06/26/2015 12:15 PM Medical Record Number: [id number] Patient Account Number: [id number] Date of Birth/Sex: 1956/04/26 (59 y.o. Female) Treating RN: Ahmed Prima Primary Care Physician: [name] Other Clinician: Referring Physician: [name] Treating Physician/Extender: Benjaman Pott in Treatment: 117 Active Inactive Nutrition Nursing Diagnoses: Impaired glucose control: actual or potential Goals: Patient/caregiver verbalizes understanding of need to maintain therapeutic glucose control per primary care physician Date Initiated: 12/16/2013 Goal Status: Active Interventions: Provide education on elevated blood sugars and impact on wound healing Notes: Orientation to the Wound Care Program Nursing Diagnoses: Knowledge deficit related to the wound healing center program Goals: Patient/caregiver will verbalize understanding of the [place] Program Date Initiated: 04/03/2013 Goal Status: Active Interventions: Provide education on orientation to the wound center Notes: Soft Tissue Infection Nursing Diagnoses: Impaired tissue integrity Goals: Patient will remain free of wound infection JOHNENE, GRANADE ([id number]) Date Initiated: 12/16/2013 Goal Status: Active Interventions: Assess signs and symptoms of infection every visit Notes: Venous Leg Ulcer Nursing Diagnoses: Knowledge deficit related to disease process and management Potential for venous Insuffiency (use before diagnosis confirmed) Goals: Patient will maintain optimal edema control Date Initiated: 08/18/2014 Goal Status: Active Interventions: Assess peripheral edema status every visit. Compression as ordered Treatment Activities: Therapeutic compression applied : 06/26/2015 Notes: Electronic Signature(s) Signed: 06/26/2015 1:51:49 PM By: Alric Quan Entered By: Alric Quan on 06/26/2015 12:53:25 [name] ([id number]) -------------------------------------------------------------------------------- Pain Assessment Details Patient Name: [name]. Date of Service: 06/26/2015 12:15 PM Medical Record Number: [id number] Patient Account Number: [id number] Date of Birth/Sex: 1955/11/14 (59 y.o.  Female) Treating RN: Ahmed Prima Primary Care Physician: [name] Other Clinician: Referring Physician: [name] Treating Physician/Extender: Judene Companion Weeks in Treatment: 117 Active Problems Location of Pain Severity and Description of Pain Patient Has Paino No Site Locations Pain Management and Medication Current Pain Management: Electronic Signature(s) Signed: 06/26/2015 1:51:49 PM By: Alric Quan Entered By: Alric Quan on 06/26/2015 12:33:09 [name] ([id number]) -------------------------------------------------------------------------------- Patient/Caregiver Education Details Patient Name: [name]. Date of Service: 06/26/2015 12:15 PM Medical Record Number: [id number] Patient Account Number: [id number] Date of Birth/Gender: 16-Jul-1955 (59 y.o. Female) Treating RN: Ahmed Prima Primary Care Physician: [name] Other Clinician: Referring Physician: [name] Treating Physician/Extender: Benjaman Pott in Treatment: 54 Education Assessment Education Provided To: Patient Education Topics Provided Wound/Skin Impairment: Handouts: Other: do not get wraps wet Methods: Demonstration, Explain/Verbal Responses: State content correctly Electronic Signature(s) Signed: 06/26/2015 1:51:49 PM By: Alric Quan Previous Signature: 06/26/2015 12:55:28 PM Version By: Judene Companion MD Entered By: Alric Quan on 06/26/2015 13:16:51 [name] ([id number]) -------------------------------------------------------------------------------- Wound Assessment Details Patient Name: [name] Date of Service: 06/26/2015 12:15 PM Medical Record Number: [id number] Patient Account Number: [id number] Date of Birth/Sex: [birth year] (59 y.o. Female) Treating RN: Carolyne Fiscal, Debi Primary Care Physician: [name] Other Clinician: Referring Physician: [name] Treating Physician/Extender: Judene Companion Weeks in Treatment: 117 Wound Status Wound Number: 56 Primary Diabetic Wound/Ulcer of the Lower Etiology: Extremity Wound Location: Left Lower Leg - Medial Wound Status: Open Wounding Event: Gradually Appeared Comorbid Lymphedema, Hypertension, Type II Date Acquired: 05/04/2015 History: Diabetes Weeks Of Treatment: 6 Clustered Wound: Yes Photos Photo Uploaded By: Alric Quan on 06/26/2015 13:35:28 Wound Measurements Length: (cm) 1.2 Width: (cm) 0.8 Depth: (cm) 0.1 Area: (cm) 0.754 Volume: (cm) 0.075 % Reduction in Area: 94.1% % Reduction in Volume: 94.2% Epithelialization: None Tunneling: No Undermining: No Wound Description Classification: Grade 1 Wound Margin: Distinct, outline attached Exudate Amount: Small Exudate Type: Serosanguineous Exudate Color: red, brown Foul Odor After Cleansing: No Wound Bed Granulation Amount: Medium (34-66%) Exposed Structure Granulation Quality: Red, Pink Fascia Exposed: No Necrotic Amount: None Present (0%) Fat Layer Exposed: No Tendon Exposed: No Hunger, Luetta K. ([id number]) Muscle Exposed: No Joint Exposed: No Bone Exposed: No Limited to Skin Breakdown Periwound Skin Texture Texture Color No Abnormalities Noted: No No Abnormalities Noted: No Callus: No Atrophie Blanche: No Crepitus: No Cyanosis: No Excoriation: No Ecchymosis: No Fluctuance: No Erythema: No Friable: No Hemosiderin Staining: No Induration: No Mottled: No Localized Edema: No Pallor: No Rash: No Rubor: No Scarring: No Temperature / Pain Moisture Tenderness on Palpation: Yes No Abnormalities Noted: No Dry / Scaly: Yes Maceration: No Moist: No Wound Preparation Ulcer Cleansing: Other: soap and water, Topical Anesthetic Applied: Other: lidocaine 4%, Treatment Notes Wound #56 (Left, Medial Lower Leg) 1. Cleansed with: Cleanse wound with antibacterial soap and water 2. Anesthetic Topical Lidocaine 4% cream to wound bed prior  to debridement 4. Dressing Applied: Aquacel Ag 5. Secondary Dressing Applied ABD Pad 7. Secured with 4 Layer Compression System - Bilateral Electronic Signature(s) Signed: 06/26/2015 1:51:49 PM By: Alric Quan Entered By: Alric Quan on 06/26/2015 12:52:54 [name] ([id number]) -------------------------------------------------------------------------------- Wound Assessment Details Patient Name: [name]. Date of Service: 06/26/2015 12:15 PM Medical Record Number: [id number] Patient Account Number: [id number] Date of Birth/Sex: January 18, 1956 (58 y.o. Female) Treating RN: Ahmed Prima Primary Care Physician: [name] Other Clinician: Referring Physician: [name] Treating Physician/Extender: Judene Companion Weeks in Treatment: 117 Wound Status Wound Number: 59 Primary Lymphedema Etiology:  Wound Location: Right Lower Leg - Circumfernential Wound Status: Open Wounding Event: Blister Comorbid Lymphedema, Hypertension, Type II History: Diabetes Date Acquired: 06/23/2015 Weeks Of Treatment: 0 Clustered Wound: No Photos Photo Uploaded By: Alric Quan on 06/26/2015 13:36:11 Wound Measurements Length: (cm) 12.5 Width: (cm) 29 Depth: (cm) 0.1 Area: (cm) 284.707 Volume: (cm) 28.471 % Reduction in Area: 0% % Reduction in Volume: 0% Epithelialization: Small (1-33%) Tunneling: No Undermining: No Wound Description Classification: Partial Thickness Foul Odor Af Diabetic Severity (Wagner): Grade 1 Wound Margin: Flat and Intact Exudate Amount: Medium Exudate Type: Serous Exudate Color: amber ter Cleansing: No Wound Bed Granulation Amount: Large (67-100%) Exposed Structure Granulation Quality: Red Fascia Exposed: No Oyster, Yardley K. ([id number]) Necrotic Amount: Small (1-33%) Fat Layer Exposed: No Necrotic Quality: Adherent Slough Tendon Exposed: No Muscle Exposed: No Joint Exposed: No Bone Exposed: No Limited to Skin  Breakdown Periwound Skin Texture Texture Color No Abnormalities Noted: No No Abnormalities Noted: No Callus: No Atrophie Blanche: No Crepitus: No Cyanosis: No Excoriation: No Ecchymosis: No Fluctuance: No Erythema: No Friable: No Hemosiderin Staining: No Induration: No Mottled: No Localized Edema: No Pallor: No Rash: No Rubor: No Scarring: No Moisture No Abnormalities Noted: No Dry / Scaly: No Maceration: No Moist: No Wound Preparation Ulcer Cleansing: Other: soap and water, Topical Anesthetic Applied: None Treatment Notes Wound #59 (Right, Circumferential Lower Leg) 1. Cleansed with: Cleanse wound with antibacterial soap and water 2. Anesthetic Topical Lidocaine 4% cream to wound bed prior to debridement 4. Dressing Applied: Aquacel Ag 5. Secondary Dressing Applied ABD Pad 7. Secured with 4 Layer Compression System - Bilateral Electronic Signature(s) Signed: 06/26/2015 1:51:49 PM By: Alric Quan Entered By: Alric Quan on 06/26/2015 12:52:34 [name] ([name] ([id number]) -------------------------------------------------------------------------------- Vitals Details Patient Name: [name] Date of Service: 06/26/2015 12:15 PM Medical Record Number: [id number] Patient Account Number: [id number] Date of Birth/Sex: 06/04/1956 (59 y.o. Female) Treating RN: Carolyne Fiscal, Debi Primary Care Physician: [name] Other Clinician: Referring Physician: [name] Treating Physician/Extender: Benjaman Pott in Treatment: 117 Vital Signs Time Taken: 12:33 Temperature (F): 97.9 Height (in): 65 Pulse (bpm): 63 Respiratory Rate (breaths/min): 18 Blood Pressure (mmHg): 155/53 Reference Range: 80 - 120 mg / dl Electronic Signature(s) Signed: 06/26/2015 1:51:49 PM By: Alric Quan Entered By: Alric Quan on 06/26/2015 12:35:23

## 2015-07-03 ENCOUNTER — Ambulatory Visit: Payer: Managed Care, Other (non HMO) | Admitting: Surgery

## 2015-07-08 ENCOUNTER — Ambulatory Visit: Admission: RE | Admit: 2015-07-08 | Payer: Managed Care, Other (non HMO) | Source: Ambulatory Visit

## 2015-07-09 ENCOUNTER — Ambulatory Visit: Payer: Managed Care, Other (non HMO) | Admitting: Surgery

## 2015-07-15 ENCOUNTER — Ambulatory Visit: Payer: Managed Care, Other (non HMO) | Admitting: Surgery

## 2015-07-17 ENCOUNTER — Encounter: Payer: Managed Care, Other (non HMO) | Attending: Surgery | Admitting: Surgery

## 2015-07-17 DIAGNOSIS — L97221 Non-pressure chronic ulcer of left calf limited to breakdown of skin: Secondary | ICD-10-CM | POA: Insufficient documentation

## 2015-07-17 DIAGNOSIS — E1140 Type 2 diabetes mellitus with diabetic neuropathy, unspecified: Secondary | ICD-10-CM | POA: Diagnosis not present

## 2015-07-17 DIAGNOSIS — I83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: Secondary | ICD-10-CM | POA: Diagnosis not present

## 2015-07-17 DIAGNOSIS — E11622 Type 2 diabetes mellitus with other skin ulcer: Secondary | ICD-10-CM | POA: Insufficient documentation

## 2015-07-17 DIAGNOSIS — I83222 Varicose veins of left lower extremity with both ulcer of calf and inflammation: Secondary | ICD-10-CM | POA: Insufficient documentation

## 2015-07-17 DIAGNOSIS — I890 Lymphedema, not elsewhere classified: Secondary | ICD-10-CM | POA: Diagnosis present

## 2015-07-17 DIAGNOSIS — R269 Unspecified abnormalities of gait and mobility: Secondary | ICD-10-CM | POA: Insufficient documentation

## 2015-07-17 DIAGNOSIS — E669 Obesity, unspecified: Secondary | ICD-10-CM | POA: Diagnosis not present

## 2015-07-17 DIAGNOSIS — R2689 Other abnormalities of gait and mobility: Secondary | ICD-10-CM | POA: Diagnosis not present

## 2015-07-18 NOTE — Progress Notes (Signed)
[name] ([id number]) Visit Report for 07/17/2015 Chief Complaint Document Details Patient Name: [name]. Date of Service: 07/17/2015 12:45 PM Medical Record Number: [id number] Patient Account Number: [id number] Date of Birth/Sex: June 22, 1956 (60 y.o. Female) Treating RN: Baruch Gouty, RN, BSN, Velva Harman Primary Care Physician: [name] Other Clinician: Referring Physician: [name] Treating Physician/Extender: Frann Rider in Treatment: 31 Information Obtained from: Patient Chief Complaint Bilateral lower extremity phlebolymphedema and recurrent calf ulcerations. Electronic Signature(s) Signed: 07/17/2015 1:17:59 PM By: Christin Fudge MD, FACS Entered By: Christin Fudge on 07/17/2015 13:17:59 [name] ([id number]) -------------------------------------------------------------------------------- HPI Details Patient Name: [name] Date of Service: 07/17/2015 12:45 PM Medical Record Number: [id number] Patient Account Number: [id number] Date of Birth/Sex: 1956-01-20 (59 y.o. Female) Treating RN: Baruch Gouty, RN, BSN, Velva Harman Primary Care Physician: [name] Other Clinician: Referring Physician: [name] Treating Physician/Extender: Frann Rider in Treatment: 42 History of Present Illness HPI Description: 60yo w/ h/o BLE phlebolymphedema, type 2 DM (unknown hemoglobin A1c), and obesity. No PVD. h/o chronic, recurrent BLE calf ulcers. Treated with 4 layer compression. Infrequently uses lymphedema pump. Bilateral lower extremity ulcerations healed as of June 2016. Fitted for custom compression stockings but did not receive them. Patient could not travel for lymphedema consult. She developed recurrent bilateral lower extremity ulcerations in August 2016. She is able to heal these quite quickly with 4 layer elastic compression bandages. However, she is not very compliant using her juxtalite compression garments and tends to develop  recurrent ulcerations while using juxtalite. Unna boots were applied this past week. She tolerated this well but performs minimal ambulation. She is scheduled to undergo total hip replacement tomorrow with Dr. Rudene Christians. She is unsure if he is aware of her ulcerations. She is otherwise without complaints. No significant pain. No fever or chills. Moderate clear drainage. Not on any antibiotics. 06/04/2015 -- she did have her hip surgery and has now been in rehabilitation and they're controlling her fluid intake and diuretics to the extent that she has lost 13 pounds of weight and her lower extremity circumference is decreased by 7 cm. 06/18/2015 -- she is still in the rehabilitation facility and continues to be looked after well and has lost a total of 15 pounds and her diabetes is under much better control. Electronic Signature(s) Signed: 07/17/2015 1:18:04 PM By: Christin Fudge MD, FACS Entered By: Christin Fudge on 07/17/2015 13:18:04 [name] ([id number]) -------------------------------------------------------------------------------- Physical Exam Details Patient Name: [name] Date of Service: 07/17/2015 12:45 PM Medical Record Number: [id number] Patient Account Number: [id number] Date of Birth/Sex: [birth year] (60 y.o. Female) Treating RN: Baruch Gouty, RN, BSN, Velva Harman Primary Care Physician: [name] Other Clinician: Referring Physician: [name] Treating Physician/Extender: Frann Rider in Treatment: 120 Constitutional . Pulse regular. Respirations normal and unlabored. Afebrile. . Eyes Nonicteric. Reactive to light. Ears, Nose, Mouth, and Throat Lips, teeth, and gums WNL.Marland Kitchen Moist mucosa without lesions. Neck supple and nontender. No palpable supraclavicular or cervical adenopathy. Normal sized without goiter. Respiratory WNL. No retractions.. Cardiovascular Pedal Pulses WNL. No clubbing, cyanosis or edema. Chest Breasts symmetical and no nipple  discharge.. Breast tissue WNL, no masses, lumps, or tenderness.. Lymphatic No adneopathy. No adenopathy. No adenopathy. Musculoskeletal Adexa without tenderness or enlargement.. Digits and nails w/o clubbing, cyanosis, infection, petechiae, ischemia, or inflammatory conditions.. Integumentary (Hair, Skin) No suspicious lesions. No crepitus or fluctuance. No peri-wound warmth or erythema. No masses.Marland Kitchen Psychiatric Judgement and insight Intact.. No evidence of depression, anxiety, or agitation.. Notes both lower extremities have good  resolution of her edema and there are no open ulcerations whatsoever. However she does have a lot of dry flaky skin but other than that she has done very well. Electronic Signature(s) Signed: 07/17/2015 1:19:27 PM By: Christin Fudge MD, FACS Previous Signature: 07/17/2015 1:18:17 PM Version By: Christin Fudge MD, FACS Entered By: Christin Fudge on 07/17/2015 13:19:27 [name] ([id number]) -------------------------------------------------------------------------------- Physician Orders Details Patient Name: [name] Date of Service: 07/17/2015 12:45 PM Medical Record Number: [id number] Patient Account Number: [id number] Date of Birth/Sex: Oct 02, 1955 (60 y.o. Female) Treating RN: Baruch Gouty, RN, BSN, Velva Harman Primary Care Physician: [name] Other Clinician: Referring Physician: [name] Treating Physician/Extender: Frann Rider in Treatment: 120 Verbal / Phone Orders: Yes Clinician: Afful, RN, BSN, Rita Read Back and Verified: Yes Diagnosis Coding Edema Control o Patient to wear own Juxtalite/Juzo compression garment. o Elevate legs to the level of the heart and pump ankles as often as possible Discharge From [place] o Discharge from [facility] Completed Electronic Signature(s) Signed: 07/17/2015 3:13:08 PM By: Regan Lemming BSN, RN Signed: 07/17/2015 4:37:54 PM By: Christin Fudge MD, FACS Entered By:  Regan Lemming on 07/17/2015 13:17:16 [name] ([id number]) -------------------------------------------------------------------------------- Problem List Details Patient Name: [name]. Date of Service: 07/17/2015 12:45 PM Medical Record Number: [id number] Patient Account Number: [id number] Date of Birth/Sex: [birth year] (60 y.o. Female) Treating RN: Afful, RN, BSN, [place] Primary Care Physician: [name] Other Clinician: Referring Physician: [name] Treating Physician/Extender: Frann Rider in Treatment: 120 Active Problems ICD-9 Encounter Code Description Active Date Diagnosis 457.1 Other Lymphedema - excludes congenital, eyelid or vulva 03/27/2013 Yes 459.33 Chronic venous hypertension with ulcer and inflammation 03/27/2013 Yes 707.12 Ulcer of lower limbs, except pressure ulcer; Ulcer of calf 03/27/2013 Yes ICD-10 Encounter Code Description Active Date Diagnosis I89.0 Lymphedema, not elsewhere classified 03/26/2014 Yes E66.9 Obesity, unspecified 03/26/2014 Yes E11.40 Type 2 diabetes mellitus with diabetic neuropathy, 01/14/2015 Yes unspecified R26.89 Other abnormalities of gait and mobility 01/14/2015 Yes 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 ([id number]) E11.622 Type 2 diabetes mellitus with other skin ulcer 02/25/2015 Yes L97.221 Non-pressure chronic ulcer of left calf limited to 05/12/2015 Yes breakdown of skin Inactive Problems Resolved Problems ICD-10 Code Description Active Date Resolved Date E11.621 Type 2 diabetes mellitus with foot ulcer 10/08/2014 10/09/2014 L03.115 Cellulitis of right lower limb 02/25/2015 02/25/2015 L84 Corns and callosities 11/12/2014 11/12/2014 Electronic Signature(s) Signed: 07/17/2015 1:17:40 PM By: Christin Fudge MD, FACS Entered By:  Christin Fudge on 07/17/2015 13:17:40 [name] ([id number]) -------------------------------------------------------------------------------- Progress Note Details Patient Name: [name]. Date of Service: 07/17/2015 12:45 PM Medical Record Number: [id number] Patient Account Number: [id number] Date of Birth/Sex: 12-10-1955 (60 y.o. Female) Treating RN: Baruch Gouty, RN, BSN, Velva Harman Primary Care Physician: [name] Other Clinician: Referring Physician: [name] Treating Physician/Extender: Frann Rider in Treatment: 120 Subjective Chief Complaint Information obtained from Patient Bilateral lower extremity phlebolymphedema and recurrent calf ulcerations. History of Present Illness (HPI) 60yo w/ h/o BLE phlebolymphedema, type 2 DM (unknown hemoglobin A1c), and obesity. No PVD. h/o chronic, recurrent BLE calf ulcers. Treated with 4 layer compression. Infrequently uses lymphedema pump. Bilateral lower extremity ulcerations healed as of June 2016. Fitted for custom compression stockings but did not receive them. Patient could not travel for lymphedema consult. She developed recurrent bilateral lower extremity ulcerations  in August 2016. She is able to heal these quite quickly with 4 layer elastic compression bandages. However, she is not very compliant using her juxtalite compression garments and tends to develop recurrent ulcerations while using juxtalite. Unna boots were applied this past week. She tolerated this well but performs minimal ambulation. She is scheduled to undergo total hip replacement tomorrow with Dr. Rudene Christians. She is unsure if he is aware of her ulcerations. She is otherwise without complaints. No significant pain. No fever or chills. Moderate clear drainage. Not on any antibiotics. 06/04/2015 -- she did have her hip surgery and has now been in rehabilitation and they're controlling her fluid intake and diuretics to the extent that she has lost 13  pounds of weight and her lower extremity circumference is decreased by 7 cm. 06/18/2015 -- she is still in the rehabilitation facility and continues to be looked after well and has lost a total of 15 pounds and her diabetes is under much better control. Objective Constitutional Pulse regular. Respirations normal and unlabored. Afebrile. Vitals Time Taken: 1:06 PM, Height: 65 in, Temperature: 98.1 F, Pulse: 53 bpm, Respiratory Rate: 20 Gravette, Dyann K. ([id number]) breaths/min, Blood Pressure: 166/45 mmHg. Eyes Nonicteric. Reactive to light. Ears, Nose, Mouth, and Throat Lips, teeth, and gums WNL.Marland Kitchen Moist mucosa without lesions. Neck supple and nontender. No palpable supraclavicular or cervical adenopathy. Normal sized without goiter. Respiratory WNL. No retractions.. Cardiovascular Pedal Pulses WNL. No clubbing, cyanosis or edema. Chest Breasts symmetical and no nipple discharge.. Breast tissue WNL, no masses, lumps, or tenderness.. Lymphatic No adneopathy. No adenopathy. No adenopathy. Musculoskeletal Adexa without tenderness or enlargement.. Digits and nails w/o clubbing, cyanosis, infection, petechiae, ischemia, or inflammatory conditions.Marland Kitchen Psychiatric Judgement and insight Intact.. No evidence of depression, anxiety, or agitation.. General Notes: both lower extremities have good resolution of her edema and there are no open ulcerations whatsoever. However she does have a lot of dry flaky skin but other than that she has done very well. Integumentary (Hair, Skin) No suspicious lesions. No crepitus or fluctuance. No peri-wound warmth or erythema. No masses.. Wound #56 status is Healed - Epithelialized. Original cause of wound was Gradually Appeared. The wound is located on the Left,Medial Lower Leg. The wound measures 0cm length x 0cm width x 0cm depth; 0cm^2 area and 0cm^3 volume. The wound is limited to skin breakdown. There is no tunneling or undermining noted. There is a  none present amount of drainage noted. The wound margin is distinct with the outline attached to the wound base. There is no granulation within the wound bed. There is no necrotic tissue within the wound bed. The periwound skin appearance exhibited: Dry/Scaly. The periwound skin appearance did not exhibit: Callus, Crepitus, Excoriation, Fluctuance, Friable, Induration, Localized Edema, Rash, Scarring, Maceration, Moist, Atrophie Blanche, Cyanosis, Ecchymosis, Hemosiderin Staining, Mottled, Pallor, Rubor, Erythema. Periwound temperature was noted as No Abnormality. Wound #59 status is Healed - Epithelialized. Original cause of wound was Blister. The wound is located on the Right,Circumferential Lower Leg. The wound measures 0cm length x 0cm width x 0cm depth; 0cm^2 area and 0cm^3 volume. The wound is limited to skin breakdown. There is no tunneling or undermining Jaskot, Lovella K. ([id number]) noted. There is a none present amount of drainage noted. The wound margin is flat and intact. There is large (67-100%) red granulation within the wound bed. There is no necrotic tissue within the wound bed. The periwound skin appearance exhibited: Dry/Scaly. The periwound skin appearance did not exhibit: Callus, Crepitus, Excoriation, Fluctuance, Friable,  Induration, Localized Edema, Rash, Scarring, Maceration, Moist, Atrophie Blanche, Cyanosis, Ecchymosis, Hemosiderin Staining, Mottled, Pallor, Rubor, Erythema. Periwound temperature was noted as No Abnormality. 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 Elevate legs to the level of the heart and pump ankles as often as possible Discharge From [facility] Services: Discharge from [place] Completed At this stage the wounds have completely healed and we have discussed using her lymphedema pumps twice a day, using her juxta light compression all day and continuing with elevation and exercise. SUREYA, FAMILIA ([id number]) She is discharge from the wound care services and was seen back as needed. Electronic Signature(s) Signed: 07/17/2015 1:20:24 PM By: Christin Fudge MD, FACS Entered By: Christin Fudge on 07/17/2015 13:20:24 [name] ([id number]) -------------------------------------------------------------------------------- SuperBill Details Patient Name: [name] Date of Service: 07/17/2015 Medical Record Number: [id number] Patient Account Number: [id number] Date of Birth/Sex: 1955-08-28 (60 y.o. Female) Treating RN: Afful, RN, BSN, Velva Harman Primary Care Physician: [name] Other Clinician: Referring Physician: [name] Treating Physician/Extender: Frann Rider in Treatment: 120 Diagnosis Coding ICD-9 Codes Code Description 457.1 Other Lymphedema - excludes congenital, eyelid or vulva 459.33 Chronic venous hypertension with ulcer and inflammation 707.12 Ulcer of lower limbs, except pressure ulcer; Ulcer of calf ICD-10 Codes Code Description I89.0 Lymphedema, not elsewhere classified E66.9 Obesity, unspecified E11.40 Type 2 diabetes mellitus with diabetic neuropathy, unspecified R26.89 Other abnormalities of  gait and mobility 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 I83.222 Varicose veins of left lower extremity with both ulcer of calf and inflammation E11.622 Type 2 diabetes mellitus with other skin ulcer L97.221 Non-pressure chronic ulcer of left calf limited to breakdown of skin Physician Procedures CPT4: Description Modifier Quantity Code [id number] - WC PHYS LEVEL 3 - EST PT 1 ICD-10 Description Diagnosis I89.0 Lymphedema, not elsewhere classified 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

## 2015-07-18 NOTE — Progress Notes (Addendum)
[name] ([id number]) Visit Report for 07/17/2015 Arrival Information Details Patient Name: [name], [name]. Date of Service: 07/17/2015 12:45 PM Medical Record Number: [id number] Patient Account Number: [id number] Date of Birth/Sex: 1956/05/02 (60 y.o. Female) Treating RN: Afful, RN, BSN, Velva Harman Primary Care Physician: [name] Other Clinician: Referring Physician: [name] Treating Physician/Extender: Frann Rider in Treatment: 61 Visit Information History Since Last Visit Added or deleted any medications: No Patient Arrived: Wheel Chair Any new allergies or adverse reactions: No Arrival Time: 13:01 Had a fall or experienced change in No Accompanied By: self activities of daily living that may affect Transfer Assistance: None risk of falls: Patient Identification Verified: Yes Signs or symptoms of abuse/neglect since last No Secondary Verification Process Yes visito Completed: Has Dressing in Place as Prescribed: No Patient Requires Transmission- No Has Compression in Place as Prescribed: No Based Precautions: Pain Present Now: No Patient Has Alerts: Yes Patient Alerts: Patient on Blood Thinner Electronic Signature(s) Signed: 07/17/2015 1:02:05 PM By: Regan Lemming BSN, RN Entered By: Regan Lemming on 07/17/2015 13:02:05 [name] ([id number]) -------------------------------------------------------------------------------- Clinic Level of Care Assessment Details Patient Name: [name]. Date of Service: 07/17/2015 12:45 PM Medical Record Number: [id number] Patient Account Number: [id number] Date of Birth/Sex: 11-05-1955 (60 y.o. Female) Treating RN: Afful, RN, BSN, Velva Harman Primary Care Physician: [name] Other Clinician: Referring Physician: [name] Treating Physician/Extender: Frann Rider in Treatment: 120 Clinic Level of Care Assessment Items TOOL 4 Quantity Score []  - Use when only an EandM is performed on  FOLLOW-UP visit 0 ASSESSMENTS - Nursing Assessment / Reassessment X - Reassessment of Co-morbidities (includes updates in patient status) 1 10 X - Reassessment of Adherence to Treatment Plan 1 5 ASSESSMENTS - Wound and Skin Assessment / Reassessment []  - Simple Wound Assessment / Reassessment - one wound 0 []  - Complex Wound Assessment / Reassessment - multiple wounds 0 []  - Dermatologic / Skin Assessment (not related to wound area) 0 ASSESSMENTS - Focused Assessment X - Circumferential Edema Measurements - multi extremities 1 5 []  - Nutritional Assessment / Counseling / Intervention 0 X - Lower Extremity Assessment (monofilament, tuning fork, pulses) 1 5 []  - Peripheral Arterial Disease Assessment (using hand held doppler) 0 ASSESSMENTS - Ostomy and/or Continence Assessment and Care []  - Incontinence Assessment and Management 0 []  - Ostomy Care Assessment and Management (repouching, etc.) 0 PROCESS - Coordination of Care X - Simple Patient / Family Education for ongoing care 1 15 []  - Complex (extensive) Patient / Family Education for ongoing care 0 []  - Staff obtains Programmer, systems, Records, Test Results / Process Orders 0 []  - Staff telephones HHA, Nursing Homes / Clarify orders / etc 0 []  - Routine Transfer to another Facility (non-emergent condition) 0 [name], [name] ([id number]) []  - Routine Hospital Admission (non-emergent condition) 0 []  - New Admissions / Biomedical engineer / Ordering NPWT, Apligraf, etc. 0 []  - Emergency Hospital Admission (emergent condition) 0 X - Simple Discharge Coordination 1 10 []  - Complex (extensive) Discharge Coordination 0 PROCESS - Special Needs []  - Pediatric / Minor Patient Management 0 []  - Isolation Patient Management 0 []  - Hearing / Language / Visual special needs 0 []  - Assessment of Community assistance (transportation, D/C planning, etc.) 0 []  - Additional assistance / Altered mentation 0 []  - Support Surface(s) Assessment (bed,  cushion, seat, etc.) 0 INTERVENTIONS - Wound Cleansing / Measurement []  - Simple Wound Cleansing - one wound 0 []  - Complex Wound Cleansing - multiple wounds  0 X - Wound Imaging (photographs - any number of wounds) 1 5 []  - Wound Tracing (instead of photographs) 0 []  - Simple Wound Measurement - one wound 0 []  - Complex Wound Measurement - multiple wounds 0 INTERVENTIONS - Wound Dressings []  - Small Wound Dressing one or multiple wounds 0 []  - Medium Wound Dressing one or multiple wounds 0 []  - Large Wound Dressing one or multiple wounds 0 []  - Application of Medications - topical 0 []  - Application of Medications - injection 0 INTERVENTIONS - Miscellaneous []  - External ear exam 0 [name], [name] ([id number]) []  - Specimen Collection (cultures, biopsies, blood, body fluids, etc.) 0 []  - Specimen(s) / Culture(s) sent or taken to Lab for analysis 0 []  - Patient Transfer (multiple staff / Harrel Lemon Lift / Similar devices) 0 []  - Simple Staple / Suture removal (25 or less) 0 []  - Complex Staple / Suture removal (26 or more) 0 []  - Hypo / Hyperglycemic Management (close monitor of Blood Glucose) 0 []  - Ankle / Brachial Index (ABI) - do not check if billed separately 0 X - Vital Signs 1 5 Has the patient been seen at the hospital within the last three years: Yes Total Score: 60 Level Of Care: New/Established - Level 2 Electronic Signature(s) Signed: 07/17/2015 1:54:34 PM By: Regan Lemming BSN, RN Entered By: Regan Lemming on 07/17/2015 13:54:33 [name] ([id number]) -------------------------------------------------------------------------------- Encounter Discharge Information Details Patient Name: [name]. Date of Service: 07/17/2015 12:45 PM Medical Record Number: [id number] Patient Account Number: [id number] Date of Birth/Sex: 1955/10/21 (60 y.o. Female) Treating RN: Baruch Gouty, RN, BSN, Velva Harman Primary Care Physician: [name] Other Clinician: Referring Physician:  [name] Treating Physician/Extender: Frann Rider in Treatment: 83 Encounter Discharge Information Items Discharge Pain Level: 0 Discharge Condition: Stable Ambulatory Status: Wheelchair Discharge Destination: Home Transportation: Other Accompanied By: self Schedule Follow-up Appointment: No Medication Reconciliation completed and provided to Patient/Care No Harald Quevedo: Provided on Clinical Summary of Care: 07/17/2015 Form Type Recipient Paper Patient GI Electronic Signature(s) Signed: 07/17/2015 1:27:16 PM By: Ruthine Dose Entered By: Ruthine Dose on 07/17/2015 13:27:16 [name] ([id number]) -------------------------------------------------------------------------------- Lower Extremity Assessment Details Patient Name: [name] Date of Service: 07/17/2015 12:45 PM Medical Record Number: [id number] Patient Account Number: [id number] Date of Birth/Sex: 11-13-1955 (60 y.o. Female) Treating RN: Afful, RN, BSN, Velva Harman Primary Care Physician: [name] Other Clinician: Referring Physician: [name] Treating Physician/Extender: Frann Rider in Treatment: 120 Edema Assessment Assessed: Shirlyn Goltz: No] [Right: No] E[Left: dema] [Right: :] Calf Left: Right: Point of Measurement: 24 cm From Medial Instep cm cm Ankle Left: Right: Point of Measurement: 8 cm From Medial Instep cm cm Vascular Assessment Pulses: Posterior Tibial Dorsalis Pedis Palpable: [Left:Yes] [Right:Yes] Extremity colors, hair growth, and conditions: Extremity Color: [Left:Mottled] [Right:Mottled] Hair Growth on Extremity: [Left:No] [Right:No] Temperature of Extremity: [Left:Warm] [Right:Warm] Capillary Refill: [Left:< 3 seconds] [Right:< 3 seconds] Toe Nail Assessment Left: Right: Thick: Yes Yes Discolored: Yes Yes Deformed: No No Improper Length and Hygiene: Yes Yes Electronic Signature(s) Signed: 07/17/2015 1:02:58 PM By: Regan Lemming BSN, RN Entered By:  Regan Lemming on 07/17/2015 13:02:58 [name] ([id number]) -------------------------------------------------------------------------------- [place] Details Patient Name: [name]. Date of Service: 07/17/2015 12:45 PM Medical Record Number: [id number] Patient Account Number: [id number] Date of Birth/Sex: 1955/11/01 (60 y.o. Female) Treating RN: Baruch Gouty, RN, BSN, Velva Harman Primary Care Physician: [name] Other Clinician: Referring Physician: [name] Treating Physician/Extender: Frann Rider in Treatment: 120 Active Inactive Electronic Signature(s)  Signed: 07/17/2015 1:53:42 PM By: Regan Lemming BSN, RN Entered By: Regan Lemming on 07/17/2015 13:53:42 [name] ([id number]) -------------------------------------------------------------------------------- Pain Assessment Details Patient Name: [name]. Date of Service: 07/17/2015 12:45 PM Medical Record Number: [id number] Patient Account Number: [id number] Date of Birth/Sex: 22-Feb-1956 (60 y.o. Female) Treating RN: Baruch Gouty, RN, BSN, Velva Harman Primary Care Physician: [name] Other Clinician: Referring Physician: [name] Treating Physician/Extender: Frann Rider in Treatment: 120 Active Problems Location of Pain Severity and Description of Pain Patient Has Paino No Site Locations Pain Management and Medication Current Pain Management: Electronic Signature(s) Signed: 07/17/2015 1:02:14 PM By: Regan Lemming BSN, RN Entered By: Regan Lemming on 07/17/2015 13:02:13 [name] ([id number]) -------------------------------------------------------------------------------- Patient/Caregiver Education Details Patient Name: [name] Date of Service: 07/17/2015 12:45 PM Medical Record Number: [id number] Patient Account Number: [id number] Date of Birth/Gender: 09-14-1955 (60 y.o. Female) Treating RN: Baruch Gouty, RN, BSN, Velva Harman Primary Care Physician: [name] Other Clinician: Referring Physician: [name] Treating Physician/Extender: Frann Rider in Treatment: 34 Education Assessment Education Provided To: Patient Education Topics Provided Elevated Blood Sugar/ Impact on Healing: Welcome To [facility]: Methods: Explain/Verbal Responses: State content correctly Electronic Signature(s) Signed: 07/17/2015 3:13:08 PM By: Regan Lemming BSN, RN Entered By: Regan Lemming on 07/17/2015 13:26:53 [name] ([id number]) -------------------------------------------------------------------------------- Wound Assessment Details Patient Name: [name] Date of Service: 07/17/2015 12:45 PM Medical Record Number: [id number] Patient Account Number: [id number] Date of Birth/Sex: August 30, 1955 (60 y.o. Female) Treating RN: Afful, RN, BSN, [place] Primary Care Physician: [name] Other Clinician: Referring Physician: [name] Treating Physician/Extender: Frann Rider in Treatment: 120 Wound Status Wound Number: 56 Primary Diabetic Wound/Ulcer of the Lower Etiology: Extremity Wound Location: Left Lower Leg - Medial Wound Status: Healed - Epithelialized Wounding Event: Gradually Appeared Comorbid Lymphedema, Hypertension, Type Date Acquired: 05/04/2015 History: II Diabetes Weeks Of Treatment: 9 Clustered Wound: Yes Photos Photo Uploaded By: Regan Lemming on 07/17/2015 15:04:06 Wound Measurements Length: (cm) Width: (cm) Depth: (cm) Area: (cm) Volume: (cm) 0 % Reduction in Area: 100% 0 % Reduction in Volume: 100% 0 Epithelialization: Large (67-100%) 0 Tunneling: No 0 Undermining: No Wound Description Classification: Grade 1 Wound Margin: Distinct, outline attached Exudate Amount: None Present Foul Odor After Cleansing: No Wound Bed Granulation Amount: None Present (0%) Exposed Structure Necrotic Amount: None Present (0%) Fascia Exposed: No Fat Layer Exposed: No Tendon Exposed:  No Muscle Exposed: No Joint Exposed: No [name], [name] ([id number]) Bone Exposed: No Limited to Skin Breakdown Periwound Skin Texture Texture Color No Abnormalities Noted: No No Abnormalities Noted: No Callus: No Atrophie Blanche: No Crepitus: No Cyanosis: No Excoriation: No Ecchymosis: No Fluctuance: No Erythema: No Friable: No Hemosiderin Staining: No Induration: No Mottled: No Localized Edema: No Pallor: No Rash: No Rubor: No Scarring: No Temperature / Pain Moisture Temperature: No Abnormality No Abnormalities Noted: No Dry / Scaly: Yes Maceration: No Moist: No Wound Preparation Ulcer Cleansing: Other: soap and water, Topical Anesthetic Applied: None Electronic Signature(s) Signed: 07/17/2015 3:13:08 PM By: Regan Lemming BSN, RN Entered By: Regan Lemming on 07/17/2015 13:15:43 [name] ([id number]) -------------------------------------------------------------------------------- Wound Assessment Details Patient Name: [name]. Date of Service: 07/17/2015 12:45 PM Medical Record Number: [id number] Patient Account Number: [id number] Date of Birth/Sex: [birth year] (60 y.o. Female) Treating RN: Baruch Gouty, RN, BSN, Velva Harman Primary Care Physician: [name] Other Clinician: Referring Physician: [name] Treating Physician/Extender: Frann Rider in Treatment: 120 Wound Status Wound Number: 59 Primary Lymphedema Etiology: Wound Location: Right Lower  Leg - Circumfernential Wound Status: Healed - Epithelialized Wounding Event: Blister Comorbid Lymphedema, Hypertension, Type History: II Diabetes Date Acquired: 06/23/2015 Weeks Of Treatment: 3 Clustered Wound: No Photos Photo Uploaded By: Regan Lemming on 07/17/2015 15:04:06 Wound Measurements Length: (cm) 0 % Reduction i Width: (cm) 0 % Reduction i Depth: (cm) 0 Epithelializa Area: (cm) 0 Tunneling: Volume: (cm) 0 Undermining: n Area: 100% n Volume: 100% tion: Large  (67-100%) No No Wound Description Classification: Partial Thickness Foul Odor Af Diabetic Severity ([place]): Grade 1 Wound Margin: Flat and Intact Exudate Amount: None Present ter Cleansing: No Wound Bed Granulation Amount: Large (67-100%) Exposed Structure Granulation Quality: Red Fascia Exposed: No Necrotic Amount: None Present (0%) Fat Layer Exposed: No Tendon Exposed: No [name], [name] ([id number]) Muscle Exposed: No Joint Exposed: No Bone Exposed: No Limited to Skin Breakdown Periwound Skin Texture Texture Color No Abnormalities Noted: No No Abnormalities Noted: No Callus: No Atrophie Blanche: No Crepitus: No Cyanosis: No Excoriation: No Ecchymosis: No Fluctuance: No Erythema: No Friable: No Hemosiderin Staining: No Induration: No Mottled: No Localized Edema: No Pallor: No Rash: No Rubor: No Scarring: No Temperature / Pain Moisture Temperature: No Abnormality No Abnormalities Noted: No Dry / Scaly: Yes Maceration: No Moist: No Wound Preparation Ulcer Cleansing: Other: soap and water, Topical Anesthetic Applied: None Electronic Signature(s) Signed: 07/17/2015 3:13:08 PM By: Regan Lemming BSN, RN Entered By: Regan Lemming on 07/17/2015 13:16:14 [name] ([id number]) -------------------------------------------------------------------------------- [place] Details Patient Name: [name]. Date of Service: 07/17/2015 12:45 PM Medical Record Number: [id number] Patient Account Number: [id number] Date of Birth/Sex: 1956-02-24 (60 y.o. Female) Treating RN: Afful, RN, BSN, [place] Primary Care Physician: [name] Other Clinician: Referring Physician: [name] Treating Physician/Extender: Frann Rider in Treatment: 120 Vital Signs Time Taken: 13:06 Temperature (F): 98.1 Height (in): 65 Pulse (bpm): 53 Respiratory Rate (breaths/min): 20 Blood Pressure (mmHg): 166/45 Reference Range: 80 - 120 mg / dl Electronic  Signature(s) Signed: 07/17/2015 3:13:08 PM By: Regan Lemming BSN, RN Entered By: Regan Lemming on 07/17/2015 13:07:56

## 2015-07-29 ENCOUNTER — Other Ambulatory Visit: Payer: Self-pay | Admitting: Family Medicine

## 2015-08-25 ENCOUNTER — Other Ambulatory Visit: Payer: Self-pay

## 2015-09-14 ENCOUNTER — Other Ambulatory Visit: Payer: Self-pay | Admitting: Orthopedic Surgery

## 2015-09-14 DIAGNOSIS — M9973 Connective tissue and disc stenosis of intervertebral foramina of lumbar region: Secondary | ICD-10-CM

## 2015-09-24 ENCOUNTER — Ambulatory Visit
Admission: RE | Admit: 2015-09-24 | Discharge: 2015-09-24 | Disposition: A | Discharge status: Home / Self Care | Payer: BLUE CROSS/BLUE SHIELD | Source: Ambulatory Visit | Attending: Orthopedic Surgery | Admitting: Orthopedic Surgery

## 2015-09-24 DIAGNOSIS — G544 Lumbosacral root disorders, not elsewhere classified: Secondary | ICD-10-CM | POA: Insufficient documentation

## 2015-09-24 DIAGNOSIS — M9973 Connective tissue and disc stenosis of intervertebral foramina of lumbar region: Secondary | ICD-10-CM | POA: Diagnosis present

## 2015-09-24 DIAGNOSIS — M47816 Spondylosis without myelopathy or radiculopathy, lumbar region: Secondary | ICD-10-CM | POA: Diagnosis not present

## 2015-09-24 DIAGNOSIS — M5126 Other intervertebral disc displacement, lumbar region: Secondary | ICD-10-CM | POA: Diagnosis not present

## 2015-10-14 ENCOUNTER — Other Ambulatory Visit: Payer: Self-pay | Admitting: Neurosurgery

## 2015-10-14 DIAGNOSIS — M4626 Osteomyelitis of vertebra, lumbar region: Secondary | ICD-10-CM

## 2015-11-04 ENCOUNTER — Ambulatory Visit: Payer: Managed Care, Other (non HMO)

## 2015-11-13 ENCOUNTER — Ambulatory Visit
Admission: RE | Admit: 2015-11-13 | Discharge: 2015-11-13 | Disposition: A | Discharge status: Home / Self Care | Payer: BLUE CROSS/BLUE SHIELD | Source: Ambulatory Visit | Attending: Neurosurgery | Admitting: Neurosurgery

## 2015-11-13 DIAGNOSIS — M4626 Osteomyelitis of vertebra, lumbar region: Secondary | ICD-10-CM | POA: Insufficient documentation

## 2015-11-13 LAB — POCT I-STAT CREATININE: CREATININE: 1.5 mg/dL — AB (ref 0.44–1.00)

## 2015-11-13 MED ORDER — GADOBENATE DIMEGLUMINE 529 MG/ML IV SOLN: 10.0000 mL | Freq: Once | INTRAVENOUS | Status: DC | PRN

## 2015-12-09 ENCOUNTER — Ambulatory Visit (INDEPENDENT_AMBULATORY_CARE_PROVIDER_SITE_OTHER): Payer: BLUE CROSS/BLUE SHIELD | Admitting: Family Medicine

## 2015-12-09 ENCOUNTER — Encounter: Payer: Self-pay | Admitting: Family Medicine

## 2015-12-09 ENCOUNTER — Ambulatory Visit
Admission: RE | Admit: 2015-12-09 | Discharge: 2015-12-09 | Disposition: A | Discharge status: Home / Self Care | Payer: BLUE CROSS/BLUE SHIELD | Source: Ambulatory Visit | Attending: Neurosurgery | Admitting: Neurosurgery

## 2015-12-09 VITALS — BP 124/70 | HR 56 | Ht 65.0 in | Wt 232.0 lb

## 2015-12-09 DIAGNOSIS — E119 Type 2 diabetes mellitus without complications: Secondary | ICD-10-CM | POA: Diagnosis not present

## 2015-12-09 DIAGNOSIS — E785 Hyperlipidemia, unspecified: Secondary | ICD-10-CM | POA: Diagnosis not present

## 2015-12-09 DIAGNOSIS — M5126 Other intervertebral disc displacement, lumbar region: Secondary | ICD-10-CM | POA: Diagnosis not present

## 2015-12-09 DIAGNOSIS — E039 Hypothyroidism, unspecified: Secondary | ICD-10-CM | POA: Diagnosis not present

## 2015-12-09 DIAGNOSIS — M4626 Osteomyelitis of vertebra, lumbar region: Secondary | ICD-10-CM | POA: Insufficient documentation

## 2015-12-09 DIAGNOSIS — I10 Essential (primary) hypertension: Secondary | ICD-10-CM | POA: Diagnosis not present

## 2015-12-09 DIAGNOSIS — R609 Edema, unspecified: Secondary | ICD-10-CM

## 2015-12-09 MED ORDER — CLONIDINE HCL 0.3 MG PO TABS: ORAL_TABLET | ORAL | Status: DC

## 2015-12-09 MED ORDER — GADOBENATE DIMEGLUMINE 529 MG/ML IV SOLN
10.0000 mL | Freq: Once | INTRAVENOUS | Status: AC | PRN
  Administered 2015-12-09: 10 mL via INTRAVENOUS

## 2015-12-09 MED ORDER — LEVOTHYROXINE SODIUM 200 MCG PO TABS: ORAL_TABLET | ORAL | Status: DC

## 2015-12-09 MED ORDER — FUROSEMIDE 40 MG PO TABS: ORAL_TABLET | ORAL | Status: DC

## 2015-12-09 MED ORDER — METOPROLOL SUCCINATE ER 50 MG PO TB24: ORAL_TABLET | ORAL | Status: DC

## 2015-12-09 MED ORDER — TELMISARTAN 80 MG PO TABS: ORAL_TABLET | ORAL | Status: DC

## 2015-12-09 NOTE — Progress Notes (Signed)
Name: [name]   MRN: [id number]    DOB: [birth year]   Date:12/09/2015       Progress Note  Subjective  Chief Complaint  Chief Complaint  Patient presents with  . Hypothyroidism  . Hypertension  . Leg Swelling    Hypertension This is a chronic problem. The current episode started more than 1 year ago. The problem has been gradually improving since onset. The problem is controlled. Pertinent negatives include no anxiety, blurred vision, chest pain, headaches, malaise/fatigue, neck pain, orthopnea, palpitations, peripheral edema, PND, shortness of breath or sweats. Past treatments include angiotensin blockers, beta blockers and diuretics (clonidine). The current treatment provides moderate improvement. There are no compliance problems.  Hypertensive end-organ damage includes a thyroid problem. There is no history of angina, kidney disease, CAD/MI, CVA, heart failure, left ventricular hypertrophy, PVD, renovascular disease or retinopathy. Identifiable causes of hypertension include chronic renal disease. There is no history of a hypertension causing med.  Thyroid Problem Presents for follow-up visit. Symptoms include weight loss. Patient reports no anxiety, cold intolerance, constipation, depressed mood, diaphoresis, diarrhea, dry skin, fatigue, hair loss, heat intolerance, hoarse voice, leg swelling, nail problem, palpitations, tremors, visual change or weight gain. The symptoms have been stable. Past treatments include levothyroxine. The treatment provided moderate relief. Her past medical history is significant for obesity. There is no history of dementia or heart failure.    No problem-specific assessment & plan notes found for this encounter.   Past Medical History  Diagnosis Date  . Hypertension   . Hypothyroidism   . Diabetes mellitus without complication ([place]) [id number]  . Kidney stone 11/2014    history of/STAGE 4 KIDNEY DISEASE PER DR Juleen China  . Lymphedema   . Multiple open  wounds of lower leg     [facility] PT    Past Surgical History  Procedure Laterality Date  . 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 Cancer Mother   . Diabetes Father     Social History   Social History  . Marital Status: Single    Spouse Name: N/A  . Number of Children: N/A  . Years of Education: N/A   Occupational History  . Not on file.   Social History Main Topics  . Smoking status: Never Smoker   . Smokeless tobacco: Not on file  . Alcohol Use: No  . Drug Use: No  . Sexual Activity: No   Other Topics Concern  . Not on file   Social History Narrative    No Known Allergies   Review of Systems  Constitutional: Positive for weight loss. Negative for fever, chills, weight gain, malaise/fatigue, diaphoresis and fatigue.  HENT: Negative for ear discharge, ear pain, hoarse voice and sore throat.   Eyes: Negative for blurred vision.  Respiratory: Negative for cough, sputum production, shortness of breath and wheezing.   Cardiovascular: Negative for chest pain, palpitations, orthopnea, leg swelling and PND.  Gastrointestinal: Negative for heartburn, nausea, abdominal pain, diarrhea, constipation, blood in stool and melena.  Genitourinary: Negative for dysuria, urgency, frequency and hematuria.  Musculoskeletal: Negative for myalgias, back pain, joint pain and neck pain.  Skin: Negative for rash.  Neurological: Negative for dizziness, tingling, tremors, sensory change, focal weakness and headaches.  Endo/Heme/Allergies: Negative for environmental allergies, cold intolerance, heat intolerance and polydipsia. Does not bruise/bleed easily.  Psychiatric/Behavioral: Negative for depression and suicidal ideas. The patient is not  nervous/anxious and does not have insomnia.      Objective  Filed Vitals:   12/09/15 1039  BP: 124/70  Pulse: 56  Height: 5\' 5"  (1.651 m)  Weight: 232 lb (105.235 kg)    Physical Exam  Constitutional: She is well-developed, well-nourished, and in no distress. No distress.  HENT:  Head: Normocephalic and atraumatic.  Right Ear: External ear normal.  Left Ear: External ear normal.  Nose: Nose normal.  Mouth/Throat: Oropharynx is clear and moist.  Eyes: Conjunctivae and EOM are normal. Pupils are equal, round, and reactive to light. Right eye exhibits no discharge. Left eye exhibits no discharge.  Neck: Normal range of motion. Neck supple. No JVD present. No thyromegaly present.  Cardiovascular: Normal rate, regular rhythm, normal heart sounds and intact distal pulses.  Exam reveals no gallop and no friction rub.   No murmur heard. Pulmonary/Chest: Effort normal and breath sounds normal.  Abdominal: Soft. Bowel sounds are normal. She exhibits no mass. There is no tenderness. There is no guarding.  Musculoskeletal: Normal range of motion. She exhibits no edema.  Lymphadenopathy:    She has no cervical adenopathy.  Neurological: She is alert. She has normal reflexes.  Skin: Skin is warm and dry. She is not diaphoretic.  Psychiatric: Mood and affect normal.  Nursing note and vitals reviewed.     Assessment & Plan  Problem List Items Addressed This Visit      Cardiovascular and Mediastinum   Essential hypertension - Primary   Relevant Medications   cloNIDine (CATAPRES) 0.3 MG tablet   metoprolol succinate (TOPROL-XL) 50 MG 24 hr tablet   telmisartan (MICARDIS) 80 MG tablet   furosemide (LASIX) 40 MG tablet   Other Relevant Orders   Renal Function Panel    Other Visit Diagnoses    Type 2 diabetes mellitus without complication, without long-term current use of insulin (HCC)        Relevant Medications    telmisartan (MICARDIS) 80 MG tablet    Other Relevant Orders     Hemoglobin A1c    Lipid Profile    Microalbumin / creatinine urine ratio    Hypothyroidism, unspecified hypothyroidism type        Relevant Medications    levothyroxine (SYNTHROID, LEVOTHROID) 200 MCG tablet    metoprolol succinate (TOPROL-XL) 50 MG 24 hr tablet    Other Relevant Orders    TSH    Edema, unspecified type        Relevant Medications    furosemide (LASIX) 40 MG tablet    Hyperlipidemia        Relevant Medications    cloNIDine (CATAPRES) 0.3 MG tablet    metoprolol succinate (TOPROL-XL) 50 MG 24 hr tablet    telmisartan (MICARDIS) 80 MG tablet    furosemide (LASIX) 40 MG tablet         Dr. [facility]  12/09/2015

## 2015-12-10 LAB — HEMOGLOBIN A1C
ESTIMATED AVERAGE GLUCOSE: 154 mg/dL
HEMOGLOBIN A1C: 7 % — AB (ref 4.8–5.6)

## 2015-12-10 LAB — RENAL FUNCTION PANEL
Albumin: 3.8 g/dL (ref 3.6–4.8)
BUN / CREAT RATIO: 15 (ref 12–28)
BUN: 23 mg/dL (ref 8–27)
CALCIUM: 9.2 mg/dL (ref 8.7–10.3)
CHLORIDE: 98 mmol/L (ref 96–106)
CO2: 22 mmol/L (ref 18–29)
CREATININE: 1.55 mg/dL — AB (ref 0.57–1.00)
GFR calc Af Amer: 41 mL/min/{1.73_m2} — ABNORMAL LOW (ref >59)
GFR calc non Af Amer: 36 mL/min/{1.73_m2} — ABNORMAL LOW (ref >59)
Glucose: 133 mg/dL — ABNORMAL HIGH (ref 65–99)
PHOSPHORUS: 3.5 mg/dL (ref 2.5–4.5)
Potassium: 4.8 mmol/L (ref 3.5–5.2)
SODIUM: 138 mmol/L (ref 134–144)

## 2015-12-10 LAB — LIPID PANEL
CHOL/HDL RATIO: 3.3 ratio (ref 0.0–4.4)
Cholesterol, Total: 195 mg/dL (ref 100–199)
HDL: 59 mg/dL (ref >39)
LDL Calculated: 123 mg/dL — ABNORMAL HIGH (ref 0–99)
Triglycerides: 66 mg/dL (ref 0–149)
VLDL Cholesterol Cal: 13 mg/dL (ref 5–40)

## 2015-12-10 LAB — MICROALBUMIN / CREATININE URINE RATIO
Creatinine, Urine: 100.7 mg/dL
MICROALB/CREAT RATIO: 45.1 mg/g creat — ABNORMAL HIGH (ref 0.0–30.0)
MICROALBUM., U, RANDOM: 45.4 ug/mL

## 2015-12-10 LAB — TSH: TSH: 0.045 u[IU]/mL — AB (ref 0.450–4.500)

## 2015-12-11 MED ORDER — LEVOTHYROXINE SODIUM 175 MCG PO TABS: 175.0000 ug | ORAL_TABLET | Freq: Every day | ORAL | Status: DC

## 2015-12-11 NOTE — Addendum Note (Signed)
Addended by: Berton Mount L on: 12/11/2015 11:21 AM   Modules accepted: Orders, Medications

## 2015-12-21 ENCOUNTER — Other Ambulatory Visit: Payer: Self-pay | Admitting: Neurosurgery

## 2016-01-04 ENCOUNTER — Other Ambulatory Visit: Payer: Self-pay | Admitting: Family Medicine

## 2016-01-28 ENCOUNTER — Inpatient Hospital Stay (HOSPITAL_COMMUNITY)
Admission: RE | Admit: 2016-01-28 | Payer: Managed Care, Other (non HMO) | Source: Ambulatory Visit | Admitting: Neurosurgery

## 2016-01-28 ENCOUNTER — Encounter (HOSPITAL_COMMUNITY): Admission: RE | Payer: Self-pay | Source: Ambulatory Visit

## 2016-01-28 SURGERY — POSTERIOR LUMBAR FUSION 1 LEVEL
Anesthesia: General

## 2016-02-01 ENCOUNTER — Other Ambulatory Visit: Payer: Self-pay

## 2016-02-01 MED ORDER — LEVOTHYROXINE SODIUM 175 MCG PO TABS: 175.0000 ug | ORAL_TABLET | Freq: Every day | ORAL | 0 refills | Status: DC

## 2016-02-22 ENCOUNTER — Other Ambulatory Visit: Payer: Self-pay | Admitting: Orthopedic Surgery

## 2016-02-22 DIAGNOSIS — G8929 Other chronic pain: Secondary | ICD-10-CM

## 2016-02-22 DIAGNOSIS — M545 Low back pain, unspecified: Secondary | ICD-10-CM

## 2016-02-25 ENCOUNTER — Other Ambulatory Visit: Payer: BLUE CROSS/BLUE SHIELD

## 2016-02-25 DIAGNOSIS — E039 Hypothyroidism, unspecified: Secondary | ICD-10-CM

## 2016-02-26 ENCOUNTER — Other Ambulatory Visit: Payer: Self-pay

## 2016-02-26 LAB — TSH: TSH: 0.326 u[IU]/mL — ABNORMAL LOW (ref 0.450–4.500)

## 2016-02-26 MED ORDER — LEVOTHYROXINE SODIUM 150 MCG PO TABS: 150.0000 ug | ORAL_TABLET | Freq: Every day | ORAL | 1 refills | Status: DC

## 2016-03-10 ENCOUNTER — Other Ambulatory Visit: Payer: Self-pay | Admitting: Orthopedic Surgery

## 2016-03-10 ENCOUNTER — Ambulatory Visit
Admission: RE | Admit: 2016-03-10 | Discharge: 2016-03-10 | Disposition: A | Discharge status: Home / Self Care | Payer: BLUE CROSS/BLUE SHIELD | Source: Ambulatory Visit | Attending: Orthopedic Surgery | Admitting: Orthopedic Surgery

## 2016-03-10 VITALS — BP 203/74 | HR 53 | Temp 97.9°F | Resp 16

## 2016-03-10 DIAGNOSIS — G8929 Other chronic pain: Secondary | ICD-10-CM

## 2016-03-10 DIAGNOSIS — M545 Low back pain, unspecified: Secondary | ICD-10-CM

## 2016-03-10 MED ORDER — SODIUM CHLORIDE 0.9 % IV SOLN
Freq: Once | INTRAVENOUS | Status: AC
  Administered 2016-03-10: 13:00:00 via INTRAVENOUS

## 2016-03-10 MED ORDER — MIDAZOLAM HCL 2 MG/2ML IJ SOLN
1.0000 mg | INTRAMUSCULAR | Status: DC | PRN
  Administered 2016-03-10: 0.5 mg via INTRAVENOUS
  Administered 2016-03-10: 1 mg via INTRAVENOUS

## 2016-03-10 MED ORDER — KETOROLAC TROMETHAMINE 30 MG/ML IJ SOLN
30.0000 mg | Freq: Once | INTRAMUSCULAR | Status: AC
  Administered 2016-03-10: 30 mg via INTRAVENOUS

## 2016-03-10 MED ORDER — FENTANYL CITRATE (PF) 100 MCG/2ML IJ SOLN
25.0000 ug | INTRAMUSCULAR | Status: DC | PRN
  Administered 2016-03-10 (×2): 50 ug via INTRAVENOUS

## 2016-03-10 NOTE — Discharge Instructions (Signed)
Disc Aspiration Post Procedure Discharge Instructions ° °1. May resume a regular diet and any medications that you routinely take (including pain medications). °2. No driving day of procedure. °3. Upon discharge go home and rest for at least 4 hours.  May use an ice pack as needed to injection sites on back. °4. Remove bandades later, today. ° ° ° °Please contact our office at [phone] for the following symptoms: ° °· Fever greater than 100 degrees °· Increased swelling, pain, or redness at injection site. ° ° °Thank you for visiting [facility]. ° °  ° ° ° °    °

## 2016-03-13 LAB — CSF CULTURE: ORGANISM ID, BACTERIA: NO GROWTH

## 2016-03-13 LAB — CSF CULTURE W GRAM STAIN
Gram Stain: NONE SEEN
Gram Stain: NONE SEEN

## 2016-03-15 LAB — ANAEROBIC CULTURE
GRAM STAIN: NONE SEEN
Gram Stain: NONE SEEN

## 2016-05-09 ENCOUNTER — Other Ambulatory Visit: Payer: Self-pay

## 2016-05-12 ENCOUNTER — Encounter: Payer: Self-pay | Admitting: Family Medicine

## 2016-05-12 ENCOUNTER — Other Ambulatory Visit: Payer: Self-pay | Admitting: Family Medicine

## 2016-05-12 ENCOUNTER — Ambulatory Visit (INDEPENDENT_AMBULATORY_CARE_PROVIDER_SITE_OTHER): Payer: BLUE CROSS/BLUE SHIELD | Admitting: Family Medicine

## 2016-05-12 VITALS — BP 160/80 | HR 60 | Ht 65.0 in | Wt 248.0 lb

## 2016-05-12 DIAGNOSIS — Z6841 Body Mass Index (BMI) 40.0 and over, adult: Secondary | ICD-10-CM | POA: Diagnosis not present

## 2016-05-12 DIAGNOSIS — E669 Obesity, unspecified: Secondary | ICD-10-CM

## 2016-05-12 DIAGNOSIS — R609 Edema, unspecified: Secondary | ICD-10-CM

## 2016-05-12 DIAGNOSIS — I10 Essential (primary) hypertension: Secondary | ICD-10-CM | POA: Diagnosis not present

## 2016-05-12 DIAGNOSIS — E039 Hypothyroidism, unspecified: Secondary | ICD-10-CM | POA: Diagnosis not present

## 2016-05-12 DIAGNOSIS — I87313 Chronic venous hypertension (idiopathic) with ulcer of bilateral lower extremity: Secondary | ICD-10-CM

## 2016-05-12 DIAGNOSIS — L89893 Pressure ulcer of other site, stage 3: Secondary | ICD-10-CM

## 2016-05-12 DIAGNOSIS — IMO0001 Reserved for inherently not codable concepts without codable children: Secondary | ICD-10-CM

## 2016-05-12 DIAGNOSIS — E119 Type 2 diabetes mellitus without complications: Secondary | ICD-10-CM

## 2016-05-12 DIAGNOSIS — L97919 Non-pressure chronic ulcer of unspecified part of right lower leg with unspecified severity: Secondary | ICD-10-CM

## 2016-05-12 DIAGNOSIS — L97929 Non-pressure chronic ulcer of unspecified part of left lower leg with unspecified severity: Secondary | ICD-10-CM

## 2016-05-12 MED ORDER — LEVOTHYROXINE SODIUM 150 MCG PO TABS: 150.0000 ug | ORAL_TABLET | Freq: Every day | ORAL | 1 refills | Status: DC

## 2016-05-12 MED ORDER — CLONIDINE HCL 0.3 MG PO TABS: ORAL_TABLET | ORAL | 1 refills | Status: DC

## 2016-05-12 MED ORDER — METOPROLOL SUCCINATE ER 100 MG PO TB24: 100.0000 mg | ORAL_TABLET | Freq: Every day | ORAL | 1 refills | Status: DC

## 2016-05-12 MED ORDER — TELMISARTAN 80 MG PO TABS: ORAL_TABLET | ORAL | 1 refills | Status: DC

## 2016-05-12 MED ORDER — FUROSEMIDE 40 MG PO TABS: 20.0000 mg | ORAL_TABLET | ORAL | 1 refills | Status: DC

## 2016-05-12 MED ORDER — ASPIRIN EC 81 MG PO TBEC: 81.0000 mg | DELAYED_RELEASE_TABLET | Freq: Every day | ORAL | 3 refills | Status: DC

## 2016-05-12 NOTE — Progress Notes (Signed)
Name: [name]   MRN: [id number]    DOB: 02-18-1956   Date:05/12/2016       Progress Note  Subjective  Chief Complaint  Chief Complaint  Patient presents with  . Hypertension    had to double the Metoprolol to BID on Tuesday  . Hypothyroidism  . Edema  . Follow-up    needs A1C drawn d/t not taking anything for diabetes due to kidneys    Hypertension  This is a chronic problem. The current episode started more than 1 year ago. The problem has been waxing and waning since onset. The problem is controlled. Pertinent negatives include no anxiety, blurred vision, chest pain, headaches, malaise/fatigue, neck pain, orthopnea, palpitations, peripheral edema, PND, shortness of breath or sweats. There are no associated agents to hypertension. There are no known risk factors for coronary artery disease. The current treatment provides moderate improvement. There are no compliance problems.  Hypertensive end-organ damage includes a thyroid problem. There is no history of angina, kidney disease, CAD/MI, CVA, heart failure, left ventricular hypertrophy, PVD, renovascular disease or retinopathy. There is no history of chronic renal disease or a hypertension causing med.  Thyroid Problem  Presents for follow-up visit. Symptoms include weight gain. Patient reports no anxiety, cold intolerance, constipation, depressed mood, diaphoresis, diarrhea, dry skin, fatigue, hair loss, heat intolerance, hoarse voice, leg swelling, nail problem, palpitations, tremors, visual change or weight loss. The symptoms have been stable. There is no history of heart failure.  Diabetes  She presents for her follow-up diabetic visit. She has type 2 diabetes mellitus. The initial diagnosis of diabetes was made 15 years ago. Her disease course has been fluctuating. Pertinent negatives for hypoglycemia include no confusion, dizziness, headaches, nervousness/anxiousness, pallor, sleepiness, speech difficulty, sweats or tremors.  Pertinent negatives for diabetes include no blurred vision, no chest pain, no fatigue, no foot paresthesias, no foot ulcerations, no polydipsia, no polyuria, no visual change, no weakness and no weight loss. Symptoms are stable. Pertinent negatives for diabetic complications include no CVA, PVD or retinopathy. There are no known risk factors for coronary artery disease. Current diabetic treatment includes diet. She is compliant with treatment some of the time. She is following a generally unhealthy diet. She has not had a previous visit with a dietitian. She participates in exercise daily. An ACE inhibitor/angiotensin II receptor blocker is not being taken. She does not see a podiatrist.Eye exam is not current.    No problem-specific Assessment & Plan notes found for this encounter.   Past Medical History:  Diagnosis Date  . Diabetes mellitus without complication ([place]) 9373  . Hypertension   . Hypothyroidism   . Kidney stone 11/2014   history of/STAGE 4 KIDNEY DISEASE PER DR Juleen China  . Lymphedema   . Multiple open wounds of lower leg    PT BEING SEEN BY [facility] FOR CHRONIC BLISTERS PER PT    Past Surgical History:  Procedure Laterality Date  . TOTAL HIP ARTHROPLASTY Right 05/28/2015   Procedure: TOTAL HIP ARTHROPLASTY ANTERIOR APPROACH;  Surgeon: Hessie Knows, MD;  Location: [facility] ORS;  Service: Orthopedics;  Laterality: Right;    Family History  Problem Relation Age of Onset  . Cancer Mother   . Diabetes Father     Social History   Social History  . Marital status: Single    Spouse name: N/A  . Number of children: N/A  . Years of education: N/A   Occupational History  . Not on file.   Social  History Main Topics  . Smoking status: Never Smoker  . Smokeless tobacco: Not on file  . Alcohol use No  . Drug use: No  . Sexual activity: No   Other Topics Concern  . Not on file   Social History Narrative  . No narrative on file    No Known Allergies   Review of  Systems  Constitutional: Positive for weight gain. Negative for chills, diaphoresis, fatigue, fever, malaise/fatigue and weight loss.  HENT: Negative for ear discharge, ear pain, hoarse voice and sore throat.   Eyes: Negative for blurred vision.  Respiratory: Negative for cough, sputum production, shortness of breath and wheezing.   Cardiovascular: Negative for chest pain, palpitations, orthopnea, leg swelling and PND.  Gastrointestinal: Negative for abdominal pain, blood in stool, constipation, diarrhea, heartburn, melena and nausea.  Genitourinary: Negative for dysuria, frequency, hematuria and urgency.  Musculoskeletal: Negative for back pain, joint pain, myalgias and neck pain.  Skin: Negative for pallor and rash.  Neurological: Negative for dizziness, tingling, tremors, sensory change, focal weakness, speech difficulty, weakness and headaches.  Endo/Heme/Allergies: Negative for environmental allergies, cold intolerance, heat intolerance and polydipsia. Does not bruise/bleed easily.  Psychiatric/Behavioral: Negative for confusion, depression and suicidal ideas. The patient is not nervous/anxious and does not have insomnia.      Objective  Vitals:   05/12/16 1022  BP: (!) 160/80  Pulse: 60  Weight: 248 lb (112.5 kg)  Height: 5\' 5"  (1.651 m)    Physical Exam  Constitutional: She is well-developed, well-nourished, and in no distress. No distress.  HENT:  Head: Normocephalic and atraumatic.  Right Ear: External ear normal.  Left Ear: External ear normal.  Nose: Nose normal.  Mouth/Throat: Oropharynx is clear and moist.  Eyes: Conjunctivae and EOM are normal. Pupils are equal, round, and reactive to light. Right eye exhibits no discharge. Left eye exhibits no discharge.  Neck: Normal range of motion. Neck supple. No JVD present. No thyromegaly present.  Cardiovascular: Normal rate, regular rhythm, S1 normal, S2 normal, normal heart sounds, intact distal pulses and normal pulses.   Exam reveals no gallop, no S4 and no friction rub.   No murmur heard. Pulmonary/Chest: Effort normal and breath sounds normal. She has no wheezes. She has no rales.  Abdominal: Soft. Bowel sounds are normal. She exhibits no mass. There is no tenderness. There is no guarding.  Musculoskeletal: Normal range of motion. She exhibits no edema.  Lymphadenopathy:    She has no cervical adenopathy.  Neurological: She is alert. She has normal sensation and normal reflexes.  Mono nl  Skin: Skin is warm, dry and intact. No rash noted. She is not diaphoretic.  Psychiatric: Mood and affect normal.  Nursing note and vitals reviewed.     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)   Relevant Medications   telmisartan (MICARDIS) 80 MG tablet   cloNIDine (CATAPRES) 0.3 MG tablet   furosemide (LASIX) 40 MG tablet   metoprolol succinate (TOPROL XL) 100 MG 24 hr tablet   aspirin EC 81 MG tablet   Essential hypertension - Primary   Relevant Medications   telmisartan (MICARDIS) 80 MG tablet   cloNIDine (CATAPRES) 0.3 MG tablet   furosemide (LASIX) 40 MG tablet   metoprolol succinate (TOPROL XL) 100 MG 24 hr tablet   aspirin EC 81 MG tablet   Other Relevant Orders  Renal Function Panel     Musculoskeletal and Integument   Pressure ulcer    Other Visit Diagnoses    Type 2 diabetes mellitus without complication, without long-term current use of insulin (HCC)       Relevant Medications   telmisartan (MICARDIS) 80 MG tablet   aspirin EC 81 MG tablet   Other Relevant Orders   Hemoglobin A1c   Renal Function Panel   Lipid Profile   Hypothyroidism, unspecified type       Relevant Medications   levothyroxine (SYNTHROID, LEVOTHROID) 150 MCG tablet   metoprolol succinate (TOPROL XL) 100 MG 24 hr tablet   Edema, unspecified type       Relevant Medications    furosemide (LASIX) 40 MG tablet   Class 3 obesity with serious comorbidity and body mass index (BMI) of 45.0 to 49.9 in adult, unspecified obesity type (HCC)            Dr. [facility]  05/12/16

## 2016-05-13 LAB — RENAL FUNCTION PANEL
Albumin: 4.1 g/dL (ref 3.6–4.8)
BUN / CREAT RATIO: 16 (ref 12–28)
BUN: 25 mg/dL (ref 8–27)
CO2: 24 mmol/L (ref 18–29)
CREATININE: 1.58 mg/dL — AB (ref 0.57–1.00)
Calcium: 9.4 mg/dL (ref 8.7–10.3)
Chloride: 97 mmol/L (ref 96–106)
GFR calc Af Amer: 40 mL/min/{1.73_m2} — ABNORMAL LOW (ref >59)
GFR, EST NON AFRICAN AMERICAN: 35 mL/min/{1.73_m2} — AB (ref >59)
Glucose: 149 mg/dL — ABNORMAL HIGH (ref 65–99)
Phosphorus: 3.8 mg/dL (ref 2.5–4.5)
Potassium: 5.6 mmol/L — ABNORMAL HIGH (ref 3.5–5.2)
SODIUM: 137 mmol/L (ref 134–144)

## 2016-05-13 LAB — HEMOGLOBIN A1C
ESTIMATED AVERAGE GLUCOSE: 166 mg/dL
HEMOGLOBIN A1C: 7.4 % — AB (ref 4.8–5.6)

## 2016-05-13 LAB — LIPID PANEL
CHOL/HDL RATIO: 3.1 ratio (ref 0.0–4.4)
Cholesterol, Total: 213 mg/dL — ABNORMAL HIGH (ref 100–199)
HDL: 68 mg/dL (ref >39)
LDL CALC: 129 mg/dL — AB (ref 0–99)
Triglycerides: 79 mg/dL (ref 0–149)
VLDL CHOLESTEROL CAL: 16 mg/dL (ref 5–40)

## 2016-05-20 ENCOUNTER — Other Ambulatory Visit: Payer: Self-pay | Admitting: Family Medicine

## 2016-05-20 DIAGNOSIS — I10 Essential (primary) hypertension: Secondary | ICD-10-CM

## 2016-05-20 DIAGNOSIS — R609 Edema, unspecified: Secondary | ICD-10-CM

## 2016-06-06 ENCOUNTER — Other Ambulatory Visit: Payer: Self-pay

## 2016-06-06 DIAGNOSIS — I10 Essential (primary) hypertension: Secondary | ICD-10-CM

## 2016-06-09 ENCOUNTER — Ambulatory Visit: Payer: BLUE CROSS/BLUE SHIELD | Admitting: Family Medicine

## 2016-06-09 ENCOUNTER — Other Ambulatory Visit: Payer: Self-pay

## 2016-11-15 ENCOUNTER — Ambulatory Visit (INDEPENDENT_AMBULATORY_CARE_PROVIDER_SITE_OTHER): Payer: BLUE CROSS/BLUE SHIELD | Admitting: Family Medicine

## 2016-11-15 VITALS — BP 140/100 | HR 64 | Ht 65.0 in | Wt 266.0 lb

## 2016-11-15 DIAGNOSIS — R809 Proteinuria, unspecified: Secondary | ICD-10-CM | POA: Diagnosis not present

## 2016-11-15 DIAGNOSIS — E039 Hypothyroidism, unspecified: Secondary | ICD-10-CM | POA: Diagnosis not present

## 2016-11-15 DIAGNOSIS — R609 Edema, unspecified: Secondary | ICD-10-CM

## 2016-11-15 DIAGNOSIS — I10 Essential (primary) hypertension: Secondary | ICD-10-CM | POA: Diagnosis not present

## 2016-11-15 DIAGNOSIS — E1129 Type 2 diabetes mellitus with other diabetic kidney complication: Secondary | ICD-10-CM

## 2016-11-15 DIAGNOSIS — E1169 Type 2 diabetes mellitus with other specified complication: Secondary | ICD-10-CM | POA: Insufficient documentation

## 2016-11-15 MED ORDER — LEVOTHYROXINE SODIUM 150 MCG PO TABS: 150.0000 ug | ORAL_TABLET | Freq: Every day | ORAL | 1 refills | Status: DC

## 2016-11-15 MED ORDER — METOPROLOL SUCCINATE ER 50 MG PO TB24: 50.0000 mg | ORAL_TABLET | Freq: Every day | ORAL | 1 refills | Status: DC

## 2016-11-15 MED ORDER — GLUCOSE BLOOD VI STRP: ORAL_STRIP | 12 refills | Status: DC

## 2016-11-15 MED ORDER — FUROSEMIDE 40 MG PO TABS: 40.0000 mg | ORAL_TABLET | Freq: Two times a day (BID) | ORAL | 0 refills | Status: DC

## 2016-11-15 MED ORDER — CLONIDINE HCL 0.3 MG PO TABS: ORAL_TABLET | ORAL | 1 refills | Status: DC

## 2016-11-15 MED ORDER — TELMISARTAN 80 MG PO TABS: ORAL_TABLET | ORAL | 1 refills | Status: DC

## 2016-11-15 NOTE — Progress Notes (Signed)
Name: [name]   MRN: [id number]    DOB: Mar 06, 1956   Date:11/15/2016       Progress Note  Subjective  Chief Complaint  Chief Complaint  Patient presents with  . Diabetes  . Leg Swelling  . Hypertension  . Hypothyroidism    Diabetes  She presents for her follow-up diabetic visit. She has type 2 diabetes mellitus. Her disease course has been stable. There are no hypoglycemic associated symptoms. Pertinent negatives for hypoglycemia include no dizziness, headaches, nervousness/anxiousness or tremors. Associated symptoms include fatigue. Pertinent negatives for diabetes include no blurred vision, no chest pain, no foot paresthesias, no foot ulcerations, no polydipsia, no polyphagia, no polyuria, no visual change, no weakness and no weight loss. There are no hypoglycemic complications. Symptoms are stable. There are no diabetic complications. Pertinent negatives for diabetic complications include no CVA, PVD or retinopathy. Risk factors for coronary artery disease include diabetes mellitus, hypertension and post-menopausal. Current diabetic treatment includes diet. She is following a generally healthy diet. She has not had a previous visit with a dietitian. She participates in exercise daily. Her breakfast blood glucose is taken between 8-9 am. Her breakfast blood glucose range is generally 180-200 mg/dl. An ACE inhibitor/angiotensin II receptor blocker is being taken. She does not see a podiatrist.Eye exam is not current.  Hypertension  This is a chronic problem. The problem has been gradually improving since onset. Associated symptoms include peripheral edema. Pertinent negatives include no blurred vision, chest pain, headaches, malaise/fatigue, neck pain, orthopnea, palpitations, PND or shortness of breath. There are no known risk factors for coronary artery disease. Past treatments include angiotensin blockers, diuretics, central alpha agonists and beta blockers. The current treatment  provides mild improvement. There are no compliance problems.  There is no history of angina, kidney disease, CAD/MI, CVA, heart failure, left ventricular hypertrophy, PVD or retinopathy. Identifiable causes of hypertension include a thyroid problem. There is no history of chronic renal disease, a hypertension causing med or renovascular disease.  Thyroid Problem  Presents for follow-up visit. Symptoms include cold intolerance, fatigue, leg swelling and weight gain. Patient reports no anxiety, constipation, depressed mood, diaphoresis, diarrhea, dry skin, hair loss, hoarse voice, nail problem, palpitations, tremors, visual change or weight loss. The symptoms have been stable. There is no history of heart failure.    No problem-specific Assessment & Plan notes found for this encounter.   Past Medical History:  Diagnosis Date  . Diabetes mellitus without complication ([place]) 0998  . Hypertension   . Hypothyroidism   . Kidney stone 11/2014   history of/STAGE 4 KIDNEY DISEASE PER DR Juleen China  . Lymphedema   . Multiple open wounds of lower leg    PT BEING SEEN BY [facility]RONIC BLISTERS PER PT    Past Surgical History:  Procedure Laterality Date  . TOTAL HIP ARTHROPLASTY Right 05/28/2015   Procedure: TOTAL HIP ARTHROPLASTY ANTERIOR APPROACH;  Surgeon: Hessie Knows, MD;  Location: [facility] ORS;  Service: Orthopedics;  Laterality: Right;    Family History  Problem Relation Age of Onset  . Cancer Mother   . Diabetes Father     Social History   Social History  . Marital status: Single    Spouse name: N/A  . Number of children: N/A  . Years of education: N/A   Occupational History  . Not on file.   Social History Main Topics  . Smoking status: Never Smoker  . Smokeless tobacco: Not on file  . Alcohol use  No  . Drug use: No  . Sexual activity: No   Other Topics Concern  . Not on file   Social History Narrative  . No narrative on file    No Known  Allergies  Outpatient Medications Prior to Visit  Medication Sig Dispense Refill  . aspirin EC 81 MG tablet Take 1 tablet (81 mg total) by mouth daily. 90 tablet 3  . cloNIDine (CATAPRES) 0.3 MG tablet Take 1 tablet by mouth 3  times daily 270 tablet 1  . furosemide (LASIX) 40 MG tablet TAKE 1 TABLET BY MOUTH ONCE A DAY AND TAKE 1/2 TABLET BY MOUTH ONCE EVERY 3 DAYS. (Patient taking differently: daily) 45 tablet 0  . levothyroxine (SYNTHROID, LEVOTHROID) 150 MCG tablet Take 1 tablet (150 mcg total) by mouth daily. Change in therapy d/t labs 90 tablet 1  . metoprolol succinate (TOPROL XL) 100 MG 24 hr tablet Take 1 tablet (100 mg total) by mouth daily. Take with or immediately following a meal. (Patient taking differently: Take 50 mg by mouth daily. Take with or immediately following a meal.) 90 tablet 1  . telmisartan (MICARDIS) 80 MG tablet Take 1 tablet by mouth  daily 90 tablet 1  . furosemide (LASIX) 40 MG tablet TAKE 1 TABLET BY MOUTH ONCE A DAY AND TAKE 1/2 TABLET BY MOUTH ONCE EVERY 3 DAYS. 45 tablet 0   No facility-administered medications prior to visit.     Review of Systems  Constitutional: Positive for fatigue and weight gain. Negative for chills, diaphoresis, fever, malaise/fatigue and weight loss.  HENT: Negative for ear discharge, ear pain, hoarse voice and sore throat.   Eyes: Negative for blurred vision.  Respiratory: Negative for cough, sputum production, shortness of breath and wheezing.   Cardiovascular: Negative for chest pain, palpitations, orthopnea, leg swelling and PND.  Gastrointestinal: Negative for abdominal pain, blood in stool, constipation, diarrhea, heartburn, melena and nausea.  Genitourinary: Negative for dysuria, frequency, hematuria and urgency.  Musculoskeletal: Negative for back pain, joint pain, myalgias and neck pain.  Skin: Negative for rash.  Neurological: Negative for dizziness, tingling, tremors, sensory change, focal weakness, weakness and  headaches.  Endo/Heme/Allergies: Positive for cold intolerance. Negative for environmental allergies, polydipsia and polyphagia. Does not bruise/bleed easily.  Psychiatric/Behavioral: Negative for depression and suicidal ideas. The patient is not nervous/anxious and does not have insomnia.      Objective  Vitals:   11/15/16 1350  BP: (!) 140/100  Pulse: 64  Weight: 266 lb (120.7 kg)  Height: 5\' 5"  (1.651 m)    Physical Exam  Constitutional: She is well-developed, well-nourished, and in no distress. No distress.  HENT:  Head: Normocephalic and atraumatic.  Right Ear: External ear normal.  Left Ear: External ear normal.  Nose: Nose normal.  Mouth/Throat: Oropharynx is clear and moist.  Eyes: Conjunctivae and EOM are normal. Pupils are equal, round, and reactive to light. Right eye exhibits no discharge. Left eye exhibits no discharge.  Neck: Normal range of motion. Neck supple. No JVD present. No thyromegaly present.  Cardiovascular: Normal rate, regular rhythm, normal heart sounds and intact distal pulses.  Exam reveals no gallop and no friction rub.   No murmur heard. Pulmonary/Chest: Effort normal and breath sounds normal. She has no wheezes. She has no rales.  Abdominal: Soft. Bowel sounds are normal. She exhibits no mass. There is no tenderness. There is no guarding.  Musculoskeletal: Normal range of motion. She exhibits no edema.  Lymphadenopathy:    She has no  cervical adenopathy.  Neurological: She is alert. She has normal reflexes.  Skin: Skin is warm and dry. She is not diaphoretic.  Psychiatric: Mood and affect normal.  Nursing note and vitals reviewed.     Assessment & Plan  Problem List Items Addressed This Visit      Cardiovascular and Mediastinum   Essential hypertension   Relevant Medications   telmisartan (MICARDIS) 80 MG tablet   metoprolol succinate (TOPROL-XL) 50 MG 24 hr tablet   furosemide (LASIX) 40 MG tablet   cloNIDine (CATAPRES) 0.3 MG tablet    Other Relevant Orders   Renal Function Panel     Endocrine   Hypothyroidism   Relevant Medications   metoprolol succinate (TOPROL-XL) 50 MG 24 hr tablet   levothyroxine (SYNTHROID, LEVOTHROID) 150 MCG tablet   Other Relevant Orders   TSH   Type 2 diabetes mellitus with microalbuminuria, without long-term current use of insulin (HCC) - Primary   Relevant Medications   telmisartan (MICARDIS) 80 MG tablet   Other Relevant Orders   Hemoglobin A1c   Renal Function Panel     Other   Edema   Relevant Medications   furosemide (LASIX) 40 MG tablet      Meds ordered this encounter  Medications  . telmisartan (MICARDIS) 80 MG tablet    Sig: Take 1 tablet by mouth  daily    Dispense:  90 tablet    Refill:  1  . metoprolol succinate (TOPROL-XL) 50 MG 24 hr tablet    Sig: Take 1 tablet (50 mg total) by mouth daily. Take with or immediately following a meal.    Dispense:  90 tablet    Refill:  1  . levothyroxine (SYNTHROID, LEVOTHROID) 150 MCG tablet    Sig: Take 1 tablet (150 mcg total) by mouth daily. Change in therapy d/t labs    Dispense:  90 tablet    Refill:  1  . furosemide (LASIX) 40 MG tablet    Sig: Take 1 tablet (40 mg total) by mouth 2 (two) times daily.    Dispense:  90 tablet    Refill:  0  . cloNIDine (CATAPRES) 0.3 MG tablet    Sig: Take 1 tablet by mouth 3  times daily    Dispense:  270 tablet    Refill:  1  . glucose blood test strip    Sig: Use as instructed    Dispense:  100 each    Refill:  12    Contour blood glucose strips      Dr. [facility]  11/15/16

## 2016-11-16 LAB — RENAL FUNCTION PANEL
ALBUMIN: 4.1 g/dL (ref 3.6–4.8)
BUN/Creatinine Ratio: 13 (ref 12–28)
BUN: 21 mg/dL (ref 8–27)
CO2: 23 mmol/L (ref 18–29)
CREATININE: 1.63 mg/dL — AB (ref 0.57–1.00)
Calcium: 9.7 mg/dL (ref 8.7–10.3)
Chloride: 98 mmol/L (ref 96–106)
GFR, EST AFRICAN AMERICAN: 38 mL/min/{1.73_m2} — AB (ref >59)
GFR, EST NON AFRICAN AMERICAN: 33 mL/min/{1.73_m2} — AB (ref >59)
Glucose: 162 mg/dL — ABNORMAL HIGH (ref 65–99)
Phosphorus: 3.1 mg/dL (ref 2.5–4.5)
Potassium: 4.5 mmol/L (ref 3.5–5.2)
Sodium: 137 mmol/L (ref 134–144)

## 2016-11-16 LAB — TSH: TSH: 0.332 u[IU]/mL — ABNORMAL LOW (ref 0.450–4.500)

## 2016-11-16 LAB — HEMOGLOBIN A1C
Est. average glucose Bld gHb Est-mCnc: 192 mg/dL
HEMOGLOBIN A1C: 8.3 % — AB (ref 4.8–5.6)

## 2016-11-17 ENCOUNTER — Other Ambulatory Visit: Payer: Self-pay

## 2016-11-17 MED ORDER — GLIPIZIDE 5 MG PO TABS: 2.5000 mg | ORAL_TABLET | Freq: Two times a day (BID) | ORAL | 1 refills | Status: DC

## 2017-01-13 ENCOUNTER — Ambulatory Visit: Payer: BLUE CROSS/BLUE SHIELD | Admitting: Family Medicine

## 2017-01-19 ENCOUNTER — Ambulatory Visit (INDEPENDENT_AMBULATORY_CARE_PROVIDER_SITE_OTHER): Payer: BLUE CROSS/BLUE SHIELD | Admitting: Family Medicine

## 2017-01-19 ENCOUNTER — Encounter: Payer: Self-pay | Admitting: Family Medicine

## 2017-01-19 VITALS — BP 120/80 | HR 72 | Ht 65.0 in | Wt 275.0 lb

## 2017-01-19 DIAGNOSIS — R809 Proteinuria, unspecified: Secondary | ICD-10-CM | POA: Diagnosis not present

## 2017-01-19 DIAGNOSIS — N183 Chronic kidney disease, stage 3 unspecified: Secondary | ICD-10-CM

## 2017-01-19 DIAGNOSIS — E1129 Type 2 diabetes mellitus with other diabetic kidney complication: Secondary | ICD-10-CM | POA: Diagnosis not present

## 2017-01-19 MED ORDER — GLIPIZIDE ER 2.5 MG PO TB24: 2.5000 mg | ORAL_TABLET | Freq: Every day | ORAL | 3 refills | Status: DC

## 2017-01-19 NOTE — Progress Notes (Signed)
Name: [name]   MRN: [id number]    DOB: April 23, 1956   Date:01/19/2017       Progress Note  Subjective  Chief Complaint  Chief Complaint  Patient presents with  . Diabetes    started on glipizide bid     Diabetes  She presents for her follow-up diabetic visit. She has type 2 diabetes mellitus. Her disease course has been fluctuating. There are no hypoglycemic associated symptoms. Pertinent negatives for hypoglycemia include no dizziness, headaches or nervousness/anxiousness. There are no diabetic associated symptoms. Pertinent negatives for diabetes include no blurred vision, no chest pain, no fatigue, no foot paresthesias, no foot ulcerations, no polydipsia, no polyphagia, no polyuria, no visual change, no weakness and no weight loss. There are no hypoglycemic complications. Symptoms are stable. Pertinent negatives for diabetic complications include no autonomic neuropathy, CVA, heart disease, impotence, nephropathy, peripheral neuropathy, PVD or retinopathy. There are no known risk factors for coronary artery disease. Current diabetic treatment includes oral agent (monotherapy). She is compliant with treatment all of the time. She is following a generally unhealthy diet. Her breakfast blood glucose is taken between 8-9 am. Her breakfast blood glucose range is generally 130-140 mg/dl. An ACE inhibitor/angiotensin II receptor blocker is being taken.    No problem-specific Assessment & Plan notes found for this encounter.   Past Medical History:  Diagnosis Date  . Diabetes mellitus without complication ([place]) 7564  . Hypertension   . Hypothyroidism   . Kidney stone 11/2014   history of/STAGE 4 KIDNEY DISEASE PER DR Juleen China  . Lymphedema   . Multiple open wounds of lower leg    PT BEING SEEN BY [facility] FOR CHRONIC BLISTERS PER PT    Past Surgical History:  Procedure Laterality Date  . TOTAL HIP ARTHROPLASTY Right 05/28/2015   Procedure: TOTAL HIP ARTHROPLASTY ANTERIOR  APPROACH;  Surgeon: Hessie Knows, MD;  Location: [facility] ORS;  Service: Orthopedics;  Laterality: Right;    Family History  Problem Relation Age of Onset  . Cancer Mother   . Diabetes Father     Social History   Social History  . Marital status: Single    Spouse name: N/A  . Number of children: N/A  . Years of education: N/A   Occupational History  . Not on file.   Social History Main Topics  . Smoking status: Never Smoker  . Smokeless tobacco: Never Used  . Alcohol use No  . Drug use: No  . Sexual activity: No   Other Topics Concern  . Not on file   Social History Narrative  . No narrative on file    No Known Allergies  Outpatient Medications Prior to Visit  Medication Sig Dispense Refill  . aspirin EC 81 MG tablet Take 1 tablet (81 mg total) by mouth daily. 90 tablet 3  . cloNIDine (CATAPRES) 0.3 MG tablet Take 1 tablet by mouth 3  times daily 270 tablet 1  . furosemide (LASIX) 40 MG tablet Take 1 tablet (40 mg total) by mouth 2 (two) times daily. 90 tablet 0  . glucose blood test strip Use as instructed 100 each 12  . levothyroxine (SYNTHROID, LEVOTHROID) 150 MCG tablet Take 1 tablet (150 mcg total) by mouth daily. Change in therapy d/t labs 90 tablet 1  . metoprolol succinate (TOPROL-XL) 50 MG 24 hr tablet Take 1 tablet (50 mg total) by mouth daily. Take with or immediately following a meal. 90 tablet 1  . telmisartan (MICARDIS) 80 MG tablet  Take 1 tablet by mouth  daily 90 tablet 1  . glipiZIDE (GLUCOTROL) 5 MG tablet Take 0.5 tablets (2.5 mg total) by mouth 2 (two) times daily before a meal. 30 tablet 1   No facility-administered medications prior to visit.     Review of Systems  Constitutional: Negative for chills, fatigue, fever, malaise/fatigue and weight loss.  HENT: Negative for ear discharge, ear pain and sore throat.   Eyes: Negative for blurred vision.  Respiratory: Negative for cough, sputum production, shortness of breath and wheezing.    Cardiovascular: Negative for chest pain, palpitations and leg swelling.  Gastrointestinal: Negative for abdominal pain, blood in stool, constipation, diarrhea, heartburn, melena and nausea.  Genitourinary: Negative for dysuria, frequency, hematuria, impotence and urgency.  Musculoskeletal: Negative for back pain, joint pain, myalgias and neck pain.  Skin: Negative for rash.  Neurological: Negative for dizziness, tingling, sensory change, focal weakness, weakness and headaches.  Endo/Heme/Allergies: Negative for environmental allergies, polydipsia and polyphagia. Does not bruise/bleed easily.  Psychiatric/Behavioral: Negative for depression and suicidal ideas. The patient is not nervous/anxious and does not have insomnia.      Objective  Vitals:   01/19/17 1414  BP: 120/80  Pulse: 72  Weight: 275 lb (124.7 kg)  Height: 5\' 5"  (1.651 m)    Physical Exam  Constitutional: She is well-developed, well-nourished, and in no distress. No distress.  HENT:  Head: Normocephalic and atraumatic.  Right Ear: External ear normal.  Left Ear: External ear normal.  Nose: Nose normal.  Mouth/Throat: Oropharynx is clear and moist.  Eyes: Pupils are equal, round, and reactive to light. Conjunctivae and EOM are normal. Right eye exhibits no discharge. Left eye exhibits no discharge.  Neck: Normal range of motion. Neck supple. No JVD present. No thyromegaly present.  Cardiovascular: Normal rate, regular rhythm, normal heart sounds and intact distal pulses.  Exam reveals no gallop and no friction rub.   No murmur heard. Pulmonary/Chest: Effort normal and breath sounds normal.  Abdominal: Soft. Bowel sounds are normal. She exhibits no mass. There is no tenderness. There is no guarding.  Musculoskeletal: Normal range of motion. She exhibits no edema.  Lymphadenopathy:    She has no cervical adenopathy.  Neurological: She is alert. She has normal reflexes.  Skin: Skin is warm and dry. No rash noted. She  is not diaphoretic.  Psychiatric: Mood and affect normal.  Nursing note and vitals reviewed.     Assessment & Plan  Problem List Items Addressed This Visit      Endocrine   Type 2 diabetes mellitus with microalbuminuria, without long-term current use of insulin (HCC) - Primary   Relevant Medications   glipiZIDE (GLUCOTROL XL) 2.5 MG 24 hr tablet   Other Relevant Orders   Renal Function Panel   Hemoglobin A1c    Other Visit Diagnoses    CKD (chronic kidney disease) stage 3, GFR 30-59 ml/min          Meds ordered this encounter  Medications  . glipiZIDE (GLUCOTROL XL) 2.5 MG 24 hr tablet    Sig: Take 1 tablet (2.5 mg total) by mouth daily with breakfast.    Dispense:  30 tablet    Refill:  3      Dr. [facility]  01/19/17

## 2017-01-20 LAB — HEMOGLOBIN A1C
Est. average glucose Bld gHb Est-mCnc: 154 mg/dL
HEMOGLOBIN A1C: 7 % — AB (ref 4.8–5.6)

## 2017-01-20 LAB — RENAL FUNCTION PANEL
Albumin: 4 g/dL (ref 3.6–4.8)
BUN/Creatinine Ratio: 13 (ref 12–28)
BUN: 20 mg/dL (ref 8–27)
CALCIUM: 9.5 mg/dL (ref 8.7–10.3)
CO2: 20 mmol/L (ref 20–29)
CREATININE: 1.6 mg/dL — AB (ref 0.57–1.00)
Chloride: 102 mmol/L (ref 96–106)
GFR calc Af Amer: 39 mL/min/{1.73_m2} — ABNORMAL LOW (ref >59)
GFR, EST NON AFRICAN AMERICAN: 34 mL/min/{1.73_m2} — AB (ref >59)
Glucose: 111 mg/dL — ABNORMAL HIGH (ref 65–99)
Phosphorus: 2.8 mg/dL (ref 2.5–4.5)
Potassium: 5.2 mmol/L (ref 3.5–5.2)
Sodium: 140 mmol/L (ref 134–144)

## 2017-04-03 ENCOUNTER — Other Ambulatory Visit: Payer: Self-pay

## 2017-04-29 ENCOUNTER — Other Ambulatory Visit: Payer: Self-pay | Admitting: Family Medicine

## 2017-04-29 DIAGNOSIS — I10 Essential (primary) hypertension: Secondary | ICD-10-CM

## 2017-05-01 ENCOUNTER — Ambulatory Visit: Payer: BLUE CROSS/BLUE SHIELD | Admitting: Family Medicine

## 2017-05-01 ENCOUNTER — Other Ambulatory Visit: Payer: Self-pay | Admitting: Family Medicine

## 2017-05-01 ENCOUNTER — Encounter: Payer: Self-pay | Admitting: Family Medicine

## 2017-05-01 VITALS — BP 130/94 | HR 76 | Ht 65.0 in | Wt 282.0 lb

## 2017-05-01 DIAGNOSIS — I10 Essential (primary) hypertension: Secondary | ICD-10-CM

## 2017-05-01 DIAGNOSIS — R809 Proteinuria, unspecified: Secondary | ICD-10-CM | POA: Diagnosis not present

## 2017-05-01 DIAGNOSIS — E039 Hypothyroidism, unspecified: Secondary | ICD-10-CM

## 2017-05-01 DIAGNOSIS — R609 Edema, unspecified: Secondary | ICD-10-CM | POA: Diagnosis not present

## 2017-05-01 DIAGNOSIS — E1129 Type 2 diabetes mellitus with other diabetic kidney complication: Secondary | ICD-10-CM

## 2017-05-01 MED ORDER — METOPROLOL SUCCINATE ER 50 MG PO TB24: 50.0000 mg | ORAL_TABLET | Freq: Every day | ORAL | 1 refills | Status: DC

## 2017-05-01 MED ORDER — GLIPIZIDE ER 2.5 MG PO TB24: 2.5000 mg | ORAL_TABLET | Freq: Every day | ORAL | 1 refills | Status: DC

## 2017-05-01 MED ORDER — LEVOTHYROXINE SODIUM 150 MCG PO TABS: 150.0000 ug | ORAL_TABLET | Freq: Every day | ORAL | 1 refills | Status: DC

## 2017-05-01 MED ORDER — CLONIDINE HCL 0.3 MG PO TABS: ORAL_TABLET | ORAL | 1 refills | Status: DC

## 2017-05-01 MED ORDER — TELMISARTAN 80 MG PO TABS: 80.0000 mg | ORAL_TABLET | Freq: Every day | ORAL | 1 refills | Status: DC

## 2017-05-01 MED ORDER — FUROSEMIDE 40 MG PO TABS: 40.0000 mg | ORAL_TABLET | Freq: Two times a day (BID) | ORAL | 1 refills | Status: DC

## 2017-05-01 NOTE — Progress Notes (Signed)
Name: [name]   MRN: [id number]    DOB: 09-26-1955   Date:05/01/2017       Progress Note  Subjective  Chief Complaint  Chief Complaint  Patient presents with  . Diabetes  . Hypothyroidism  . Hypertension  . Edema    peripheral edema/ blood pressure- takes furosemide    Diabetes  She presents for her follow-up diabetic visit. She has type 2 diabetes mellitus. Her disease course has been stable. There are no hypoglycemic associated symptoms. Pertinent negatives for hypoglycemia include no dizziness, headaches, nervousness/anxiousness or sweats. Associated symptoms include fatigue. Pertinent negatives for diabetes include no blurred vision, no chest pain, no polydipsia and no weight loss. There are no hypoglycemic complications. Symptoms are stable. There are no diabetic complications. Pertinent negatives for diabetic complications include no CVA, PVD or retinopathy. There are no known risk factors for coronary artery disease. Current diabetic treatment includes oral agent (monotherapy). She is compliant with treatment all of the time. Her weight is stable. She is following a generally healthy diet. Meal planning includes avoidance of concentrated sweets. She rarely participates in exercise. Her breakfast blood glucose is taken between 8-9 am. Her breakfast blood glucose range is generally 130-140 mg/dl. An ACE inhibitor/angiotensin II receptor blocker is not being taken. She does not see a podiatrist.Eye exam is not current.  Hypertension  This is a chronic problem. The current episode started more than 1 year ago. The problem has been gradually improving since onset. The problem is controlled. Pertinent negatives include no anxiety, blurred vision, chest pain, headaches, malaise/fatigue, neck pain, orthopnea, palpitations, peripheral edema, PND, shortness of breath or sweats. There are no associated agents to hypertension. Risk factors for coronary artery disease include dyslipidemia,  diabetes mellitus, obesity and post-menopausal state. Past treatments include beta blockers, calcium channel blockers and diuretics (catapress). There are no compliance problems.  There is no history of angina, kidney disease, CAD/MI, CVA, heart failure, left ventricular hypertrophy, PVD or retinopathy. Identifiable causes of hypertension include a thyroid problem. There is no history of chronic renal disease, a hypertension causing med or renovascular disease.  Thyroid Problem  Presents for follow-up visit. Symptoms include fatigue, leg swelling and weight gain. Patient reports no anxiety, cold intolerance, constipation, depressed mood, diaphoresis, diarrhea, hair loss, nail problem, palpitations or weight loss. The symptoms have been stable. There is no history of heart failure.    No problem-specific Assessment & Plan notes found for this encounter.   Past Medical History:  Diagnosis Date  . Diabetes mellitus without complication ([place]) 6203  . Hypertension   . Hypothyroidism   . Kidney stone 11/2014   history of/STAGE 4 KIDNEY DISEASE PER DR Juleen China  . Lymphedema   . Multiple open wounds of lower leg    PT BEING SEEN BY [facility]RON[facility] BLISTERS PER PT    History reviewed. No pertinent surgical history.  Family History  Problem Relation Age of Onset  . Cancer Mother   . Diabetes Father     Social History   Socioeconomic History  . Marital status: Single    Spouse name: Not on file  . Number of children: Not on file  . Years of education: Not on file  . Highest education level: Not on file  Social Needs  . Financial resource strain: Not on file  . Food insecurity - worry: Not on file  . Food insecurity - inability: Not on file  . Transportation needs - medical: Not on file  .  Transportation needs - non-medical: Not on file  Occupational History  . Not on file  Tobacco Use  . Smoking status: Never Smoker  . Smokeless tobacco: Never Used  Substance and Sexual  Activity  . Alcohol use: No  . Drug use: No  . Sexual activity: No  Other Topics Concern  . Not on file  Social History Narrative  . Not on file    No Known Allergies  Outpatient Medications Prior to Visit  Medication Sig Dispense Refill  . aspirin EC 81 MG tablet Take 1 tablet (81 mg total) by mouth daily. 90 tablet 3  . glucose blood test strip Use as instructed 100 each 12  . cloNIDine (CATAPRES) 0.3 MG tablet Take 1 tablet by mouth 3  times daily 270 tablet 1  . furosemide (LASIX) 40 MG tablet Take 1 tablet (40 mg total) by mouth 2 (two) times daily. 90 tablet 0  . glipiZIDE (GLUCOTROL XL) 2.5 MG 24 hr tablet Take 1 tablet (2.5 mg total) by mouth daily with breakfast. 30 tablet 3  . levothyroxine (SYNTHROID, LEVOTHROID) 150 MCG tablet Take 1 tablet (150 mcg total) by mouth daily. Change in therapy d/t labs 90 tablet 1  . metoprolol succinate (TOPROL-XL) 50 MG 24 hr tablet Take 1 tablet (50 mg total) by mouth daily. Take with or immediately following a meal. 90 tablet 1  . telmisartan (MICARDIS) 80 MG tablet TAKE ONE TABLET BY MOUTH EVERY DAY 90 tablet 0   No facility-administered medications prior to visit.     Review of Systems  Constitutional: Positive for fatigue and weight gain. Negative for chills, diaphoresis, fever, malaise/fatigue and weight loss.  HENT: Negative for ear discharge, ear pain and sore throat.   Eyes: Negative for blurred vision.  Respiratory: Negative for cough, sputum production, shortness of breath and wheezing.   Cardiovascular: Negative for chest pain, palpitations, orthopnea, leg swelling and PND.  Gastrointestinal: Negative for abdominal pain, blood in stool, constipation, diarrhea, heartburn, melena and nausea.  Genitourinary: Negative for dysuria, frequency, hematuria and urgency.  Musculoskeletal: Negative for back pain, joint pain, myalgias and neck pain.  Skin: Negative for rash.  Neurological: Negative for dizziness, tingling, sensory change,  focal weakness and headaches.  Endo/Heme/Allergies: Negative for environmental allergies, cold intolerance and polydipsia. Does not bruise/bleed easily.  Psychiatric/Behavioral: Negative for depression and suicidal ideas. The patient is not nervous/anxious and does not have insomnia.      Objective  Vitals:   05/01/17 1355  BP: (!) 130/94  Pulse: 76  Weight: 282 lb (127.9 kg)  Height: 5\' 5"  (1.651 m)    Physical Exam  Constitutional: She is well-developed, well-nourished, and in no distress. No distress.  HENT:  Head: Normocephalic and atraumatic.  Right Ear: External ear normal.  Left Ear: External ear normal.  Nose: Nose normal.  Mouth/Throat: Oropharynx is clear and moist.  Eyes: Conjunctivae and EOM are normal. Pupils are equal, round, and reactive to light. Right eye exhibits no discharge. Left eye exhibits no discharge.  Neck: Normal range of motion. Neck supple. No JVD present. No thyromegaly present.  Cardiovascular: Normal rate, regular rhythm, normal heart sounds and intact distal pulses. Exam reveals no gallop and no friction rub.  No murmur heard. Pulmonary/Chest: Effort normal and breath sounds normal.  Abdominal: Soft. Bowel sounds are normal. She exhibits no mass. There is no tenderness. There is no guarding.  Musculoskeletal: Normal range of motion. She exhibits no edema.  Lymphadenopathy:    She has no  cervical adenopathy.  Neurological: She is alert. She has normal reflexes.  Skin: Skin is warm and dry. She is not diaphoretic.  Psychiatric: Mood and affect normal.  Nursing note and vitals reviewed.     Assessment & Plan  Problem List Items Addressed This Visit      Cardiovascular and Mediastinum   Essential hypertension   Relevant Medications   furosemide (LASIX) 40 MG tablet   telmisartan (MICARDIS) 80 MG tablet   cloNIDine (CATAPRES) 0.3 MG tablet   metoprolol succinate (TOPROL-XL) 50 MG 24 hr tablet   Other Relevant Orders   Renal Function  Panel     Endocrine   Hypothyroidism   Relevant Medications   metoprolol succinate (TOPROL-XL) 50 MG 24 hr tablet   levothyroxine (SYNTHROID, LEVOTHROID) 150 MCG tablet   Other Relevant Orders   TSH   Type 2 diabetes mellitus with microalbuminuria, without long-term current use of insulin (HCC) - Primary   Relevant Medications   telmisartan (MICARDIS) 80 MG tablet   glipiZIDE (GLUCOTROL XL) 2.5 MG 24 hr tablet   Other Relevant Orders   Hemoglobin A1c   Renal Function Panel   Lipid Profile     Other   Edema   Relevant Medications   furosemide (LASIX) 40 MG tablet    Other Visit Diagnoses    Morbid obesity (HCC)       Relevant Medications   glipiZIDE (GLUCOTROL XL) 2.5 MG 24 hr tablet      Meds ordered this encounter  Medications  . furosemide (LASIX) 40 MG tablet    Sig: Take 1 tablet (40 mg total) 2 (two) times daily by mouth.    Dispense:  180 tablet    Refill:  1  . telmisartan (MICARDIS) 80 MG tablet    Sig: Take 1 tablet (80 mg total) daily by mouth.    Dispense:  90 tablet    Refill:  1  . cloNIDine (CATAPRES) 0.3 MG tablet    Sig: Take 1 tablet by mouth 3  times daily    Dispense:  270 tablet    Refill:  1  . metoprolol succinate (TOPROL-XL) 50 MG 24 hr tablet    Sig: Take 1 tablet (50 mg total) daily by mouth. Take with or immediately following a meal.    Dispense:  90 tablet    Refill:  1  . levothyroxine (SYNTHROID, LEVOTHROID) 150 MCG tablet    Sig: Take 1 tablet (150 mcg total) daily by mouth. Change in therapy d/t labs    Dispense:  90 tablet    Refill:  1  . glipiZIDE (GLUCOTROL XL) 2.5 MG 24 hr tablet    Sig: Take 1 tablet (2.5 mg total) daily with breakfast by mouth.    Dispense:  90 tablet    Refill:  1      Dr. [name] [facility]  05/01/17

## 2017-05-02 ENCOUNTER — Other Ambulatory Visit: Payer: Self-pay

## 2017-05-02 DIAGNOSIS — R7989 Other specified abnormal findings of blood chemistry: Secondary | ICD-10-CM

## 2017-05-02 LAB — RENAL FUNCTION PANEL
ALBUMIN: 4.2 g/dL (ref 3.6–4.8)
BUN/Creatinine Ratio: 12 (ref 12–28)
BUN: 18 mg/dL (ref 8–27)
CHLORIDE: 96 mmol/L (ref 96–106)
CO2: 23 mmol/L (ref 20–29)
CREATININE: 1.55 mg/dL — AB (ref 0.57–1.00)
Calcium: 9.8 mg/dL (ref 8.7–10.3)
GFR calc non Af Amer: 35 mL/min/{1.73_m2} — ABNORMAL LOW (ref >59)
GFR, EST AFRICAN AMERICAN: 41 mL/min/{1.73_m2} — AB (ref >59)
Glucose: 113 mg/dL — ABNORMAL HIGH (ref 65–99)
Phosphorus: 2.7 mg/dL (ref 2.5–4.5)
Potassium: 5.2 mmol/L (ref 3.5–5.2)
Sodium: 135 mmol/L (ref 134–144)

## 2017-05-02 LAB — LIPID PANEL
CHOLESTEROL TOTAL: 245 mg/dL — AB (ref 100–199)
Chol/HDL Ratio: 4.4 ratio (ref 0.0–4.4)
HDL: 56 mg/dL (ref >39)
LDL CALC: 163 mg/dL — AB (ref 0–99)
Triglycerides: 131 mg/dL (ref 0–149)
VLDL CHOLESTEROL CAL: 26 mg/dL (ref 5–40)

## 2017-05-02 LAB — HEMOGLOBIN A1C
ESTIMATED AVERAGE GLUCOSE: 157 mg/dL
HEMOGLOBIN A1C: 7.1 % — AB (ref 4.8–5.6)

## 2017-05-02 LAB — TSH: TSH: 2.12 u[IU]/mL (ref 0.450–4.500)

## 2017-06-26 ENCOUNTER — Telehealth: Payer: Self-pay

## 2017-06-26 NOTE — Telephone Encounter (Signed)
Concern for patient living conditions  per emergency contact who is a friend. She has to help by driving 30 min to take trash to road as patient will not walk to take trash out and says it is cause she is afraid to fall after new hip. Emergency Contact wants [name] to go look at house and condition and feels she is lazy and needs Home Health. Baxter Flattery to call her back.

## 2017-06-28 NOTE — Telephone Encounter (Signed)
I explained to this lady that called- we do not go out and evaluate living conditions. If she is worried about the pt "living like a hoarder" she can call Social Services and report unhealthy living conditions. She stated the pt "no longer can drive, doesn't have a sink to wash anything, doesn't have a stove, and doesn't have a bed."

## 2017-07-25 ENCOUNTER — Other Ambulatory Visit: Payer: Self-pay

## 2017-07-25 DIAGNOSIS — I10 Essential (primary) hypertension: Secondary | ICD-10-CM

## 2017-07-25 MED ORDER — TELMISARTAN 80 MG PO TABS: ORAL_TABLET | ORAL | 1 refills | Status: DC

## 2017-09-14 DIAGNOSIS — E1122 Type 2 diabetes mellitus with diabetic chronic kidney disease: Secondary | ICD-10-CM | POA: Diagnosis not present

## 2017-09-14 DIAGNOSIS — R809 Proteinuria, unspecified: Secondary | ICD-10-CM | POA: Diagnosis not present

## 2017-09-14 DIAGNOSIS — I129 Hypertensive chronic kidney disease with stage 1 through stage 4 chronic kidney disease, or unspecified chronic kidney disease: Secondary | ICD-10-CM | POA: Diagnosis not present

## 2017-09-14 DIAGNOSIS — N183 Chronic kidney disease, stage 3 (moderate): Secondary | ICD-10-CM | POA: Diagnosis not present

## 2017-10-22 ENCOUNTER — Other Ambulatory Visit: Payer: Self-pay | Admitting: Family Medicine

## 2017-10-22 DIAGNOSIS — R809 Proteinuria, unspecified: Secondary | ICD-10-CM

## 2017-10-22 DIAGNOSIS — E039 Hypothyroidism, unspecified: Secondary | ICD-10-CM

## 2017-10-22 DIAGNOSIS — I10 Essential (primary) hypertension: Secondary | ICD-10-CM

## 2017-10-22 DIAGNOSIS — E1129 Type 2 diabetes mellitus with other diabetic kidney complication: Secondary | ICD-10-CM

## 2017-10-24 ENCOUNTER — Ambulatory Visit (INDEPENDENT_AMBULATORY_CARE_PROVIDER_SITE_OTHER): Payer: Medicare HMO | Admitting: Family Medicine

## 2017-10-24 ENCOUNTER — Encounter: Payer: Self-pay | Admitting: Family Medicine

## 2017-10-24 VITALS — BP 138/90 | HR 88 | Ht 65.0 in | Wt 282.0 lb

## 2017-10-24 DIAGNOSIS — R809 Proteinuria, unspecified: Secondary | ICD-10-CM

## 2017-10-24 DIAGNOSIS — E1129 Type 2 diabetes mellitus with other diabetic kidney complication: Secondary | ICD-10-CM | POA: Diagnosis not present

## 2017-10-24 DIAGNOSIS — L97929 Non-pressure chronic ulcer of unspecified part of left lower leg with unspecified severity: Secondary | ICD-10-CM

## 2017-10-24 DIAGNOSIS — L97919 Non-pressure chronic ulcer of unspecified part of right lower leg with unspecified severity: Secondary | ICD-10-CM | POA: Diagnosis not present

## 2017-10-24 DIAGNOSIS — E039 Hypothyroidism, unspecified: Secondary | ICD-10-CM

## 2017-10-24 DIAGNOSIS — I10 Essential (primary) hypertension: Secondary | ICD-10-CM | POA: Diagnosis not present

## 2017-10-24 DIAGNOSIS — R609 Edema, unspecified: Secondary | ICD-10-CM

## 2017-10-24 DIAGNOSIS — N184 Chronic kidney disease, stage 4 (severe): Secondary | ICD-10-CM | POA: Insufficient documentation

## 2017-10-24 DIAGNOSIS — I87313 Chronic venous hypertension (idiopathic) with ulcer of bilateral lower extremity: Secondary | ICD-10-CM | POA: Diagnosis not present

## 2017-10-24 MED ORDER — METOPROLOL SUCCINATE ER 50 MG PO TB24: 50.0000 mg | ORAL_TABLET | Freq: Every day | ORAL | 1 refills | Status: DC

## 2017-10-24 MED ORDER — FUROSEMIDE 40 MG PO TABS: 40.0000 mg | ORAL_TABLET | Freq: Two times a day (BID) | ORAL | 1 refills | Status: DC

## 2017-10-24 MED ORDER — TELMISARTAN 80 MG PO TABS: ORAL_TABLET | ORAL | 1 refills | Status: DC

## 2017-10-24 MED ORDER — LEVOTHYROXINE SODIUM 150 MCG PO TABS: 150.0000 ug | ORAL_TABLET | Freq: Every day | ORAL | 1 refills | Status: DC

## 2017-10-24 MED ORDER — CLONIDINE HCL 0.3 MG PO TABS: ORAL_TABLET | ORAL | 1 refills | Status: DC

## 2017-10-24 NOTE — Progress Notes (Signed)
Name: [name]   MRN: [id number]    DOB: 1955-07-10   Date:10/24/2017       Progress Note  Subjective  Chief Complaint  Chief Complaint  Patient presents with  . Diabetes  . Hypothyroidism  . Hypertension  . Leg Swelling    refill lasix    Diabetes  She presents for her follow-up diabetic visit. She has type 2 diabetes mellitus. Her disease course has been stable. There are no hypoglycemic associated symptoms. Pertinent negatives for hypoglycemia include no dizziness, headaches, nervousness/anxiousness or sweats. Pertinent negatives for diabetes include no blurred vision, no chest pain, no fatigue, no foot paresthesias, no foot ulcerations, no polydipsia, no polyphagia, no polyuria, no visual change, no weakness and no weight loss. There are no hypoglycemic complications. Symptoms are stable. There are no diabetic complications. Pertinent negatives for diabetic complications include no CVA, PVD or retinopathy. Risk factors for coronary artery disease include dyslipidemia, diabetes mellitus and hypertension. Current diabetic treatment includes oral agent (monotherapy) (glypizide). She is compliant with treatment some of the time. Her weight is stable. She never participates in exercise. Her breakfast blood glucose is taken between 8-9 am. Her breakfast blood glucose range is generally 130-140 mg/dl. An ACE inhibitor/angiotensin II receptor blocker is being taken. She does not see a podiatrist.Eye exam is not current.  Hypertension  This is a chronic problem. The current episode started more than 1 year ago. The problem has been waxing and waning since onset. The problem is controlled. Associated symptoms include peripheral edema. Pertinent negatives include no anxiety, blurred vision, chest pain, headaches, malaise/fatigue, neck pain, orthopnea, palpitations, PND, shortness of breath or sweats. There are no associated agents to hypertension. Risk factors for coronary artery disease include  diabetes mellitus, dyslipidemia, obesity and post-menopausal state. Past treatments include beta blockers, calcium channel blockers and diuretics (and clonadine). The current treatment provides moderate improvement. There are no compliance problems.  There is no history of angina, kidney disease, CAD/MI, CVA, heart failure, left ventricular hypertrophy, PVD or retinopathy. Identifiable causes of hypertension include a thyroid problem. There is no history of chronic renal disease, a hypertension causing med or renovascular disease.  Thyroid Problem  Presents for follow-up visit. Symptoms include weight gain. Patient reports no anxiety, cold intolerance, constipation, depressed mood, diaphoresis, diarrhea, dry skin, fatigue, hair loss, heat intolerance, leg swelling, nail problem, palpitations, visual change or weight loss. The symptoms have been stable. There is no history of heart failure.    No problem-specific Assessment & Plan notes found for this encounter.   Past Medical History:  Diagnosis Date  . Diabetes mellitus without complication ([place]) 0254  . Hypertension   . Hypothyroidism   . Kidney stone 11/2014   history of/STAGE 4 KIDNEY DISEASE PER DR Juleen China  . Lymphedema   . Multiple open wounds of lower leg    PT BEING SEEN BY THE [facility]RONIC BLISTERS PER PT    Past Surgical History:  Procedure Laterality Date  . TOTAL HIP ARTHROPLASTY Right 05/28/2015   Procedure: TOTAL HIP ARTHROPLASTY ANTERIOR APPROACH;  Surgeon: Hessie Knows, MD;  Location: [facility] ORS;  Service: Orthopedics;  Laterality: Right;    Family History  Problem Relation Age of Onset  . Cancer Mother   . Diabetes Father     Social History   Socioeconomic History  . Marital status: Single    Spouse name: Not on file  . Number of children: Not on file  . Years of education: Not on  file  . Highest education level: Not on file  Occupational History  . Not on file  Social Needs  . Financial resource  strain: Not on file  . Food insecurity:    Worry: Not on file    Inability: Not on file  . Transportation needs:    Medical: Not on file    Non-medical: Not on file  Tobacco Use  . Smoking status: Never Smoker  . Smokeless tobacco: Never Used  Substance and Sexual Activity  . Alcohol use: No  . Drug use: No  . Sexual activity: Never  Lifestyle  . Physical activity:    Days per week: Not on file    Minutes per session: Not on file  . Stress: Not on file  Relationships  . Social connections:    Talks on phone: Not on file    Gets together: Not on file    Attends religious service: Not on file    Active member of club or organization: Not on file    Attends meetings of clubs or organizations: Not on file    Relationship status: Not on file  . Intimate partner violence:    Fear of current or ex partner: Not on file    Emotionally abused: Not on file    Physically abused: Not on file    Forced sexual activity: Not on file  Other Topics Concern  . Not on file  Social History Narrative  . Not on file    No Known Allergies  Outpatient Medications Prior to Visit  Medication Sig Dispense Refill  . aspirin EC 81 MG tablet Take 1 tablet (81 mg total) by mouth daily. 90 tablet 3  . glipiZIDE (GLUCOTROL XL) 2.5 MG 24 hr tablet TAKE ONE TABLET BY MOUTH EVERY DAY WITH BREAKFAST. 90 tablet 1  . glucose blood test strip Use as instructed 100 each 12  . cloNIDine (CATAPRES) 0.3 MG tablet TAKE ONE TABLET BY MOUTH 3 TIMES DAILY. 270 tablet 1  . furosemide (LASIX) 40 MG tablet Take 1 tablet (40 mg total) 2 (two) times daily by mouth. 180 tablet 1  . levothyroxine (SYNTHROID, LEVOTHROID) 150 MCG tablet TAKE ONE TABLET BY MOUTH EVERY DAY 90 tablet 1  . metoprolol succinate (TOPROL-XL) 50 MG 24 hr tablet Take 1 tablet (50 mg total) daily by mouth. Take with or immediately following a meal. 90 tablet 1  . telmisartan (MICARDIS) 80 MG tablet TAKE ONE TABLET BY MOUTH EVERY DAY 90 tablet 1   No  facility-administered medications prior to visit.     Review of Systems  Constitutional: Positive for weight gain. Negative for chills, diaphoresis, fatigue, fever, malaise/fatigue and weight loss.  HENT: Negative for ear discharge, ear pain and sore throat.   Eyes: Negative for blurred vision.  Respiratory: Negative for cough, sputum production, shortness of breath and wheezing.   Cardiovascular: Negative for chest pain, palpitations, orthopnea, leg swelling and PND.  Gastrointestinal: Negative for abdominal pain, blood in stool, constipation, diarrhea, heartburn, melena and nausea.  Genitourinary: Negative for dysuria, frequency, hematuria and urgency.  Musculoskeletal: Negative for back pain, joint pain, myalgias and neck pain.  Skin: Negative for rash.  Neurological: Negative for dizziness, tingling, sensory change, focal weakness, weakness and headaches.  Endo/Heme/Allergies: Negative for environmental allergies, cold intolerance, heat intolerance, polydipsia and polyphagia. Does not bruise/bleed easily.  Psychiatric/Behavioral: Negative for depression and suicidal ideas. The patient is not nervous/anxious and does not have insomnia.      Objective  Vitals:  10/24/17 1409  BP: 138/90  Pulse: 88  Weight: 282 lb (127.9 kg)  Height: 5\' 5"  (1.651 m)    Physical Exam  Constitutional: No distress.  HENT:  Head: Normocephalic and atraumatic.  Right Ear: Tympanic membrane, external ear and ear canal normal.  Left Ear: Tympanic membrane, external ear and ear canal normal.  Nose: Nose normal. No mucosal edema.  Mouth/Throat: Oropharynx is clear and moist. No oropharyngeal exudate, posterior oropharyngeal edema or posterior oropharyngeal erythema.  Eyes: Pupils are equal, round, and reactive to light. Conjunctivae and EOM are normal. Right eye exhibits no discharge. Left eye exhibits no discharge.  Neck: Normal range of motion. Neck supple. No JVD present. No thyromegaly present.   Cardiovascular: Normal rate, regular rhythm, S1 normal, S2 normal, normal heart sounds and intact distal pulses. Exam reveals no gallop, no S3, no S4 and no friction rub.  No murmur heard. Pulmonary/Chest: Effort normal and breath sounds normal. No respiratory distress. She has no decreased breath sounds. She has no wheezes. She has no rhonchi. She has no rales.  Abdominal: Soft. Bowel sounds are normal. She exhibits no mass. There is no tenderness. There is no guarding.  Musculoskeletal: Normal range of motion. She exhibits no edema.  Feet:  Right Foot:  Protective Sensation: 10 sites tested. 10 sites sensed.  Left Foot:  Protective Sensation: 10 sites tested. 10 sites sensed.  Lymphadenopathy:    She has no cervical adenopathy.  Neurological: She is alert. She has normal reflexes.  Skin: Skin is warm and dry. She is not diaphoretic.      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)   Relevant Medications   furosemide (LASIX) 40 MG tablet   telmisartan (MICARDIS) 80 MG tablet   cloNIDine (CATAPRES) 0.3 MG tablet   metoprolol succinate (TOPROL-XL) 50 MG 24 hr tablet   Essential hypertension   Relevant Medications   furosemide (LASIX) 40 MG tablet   telmisartan (MICARDIS) 80 MG tablet   cloNIDine (CATAPRES) 0.3 MG tablet   metoprolol succinate (TOPROL-XL) 50 MG 24 hr tablet   Other Relevant Orders   Renal Function Panel     Endocrine   Hypothyroidism   Relevant Medications   metoprolol succinate (TOPROL-XL) 50 MG 24 hr tablet   levothyroxine (SYNTHROID, LEVOTHROID) 150 MCG tablet   Type 2 diabetes mellitus with microalbuminuria, without long-term current use of insulin (HCC) - Primary   Relevant Medications   telmisartan (MICARDIS) 80 MG tablet   Other Relevant Orders   Hemoglobin A1c   Renal Function Panel     Other   Edema    Relevant Medications   furosemide (LASIX) 40 MG tablet   Morbid obesity (HCC)      Meds ordered this encounter  Medications  . furosemide (LASIX) 40 MG tablet    Sig: Take 1 tablet (40 mg total) by mouth 2 (two) times daily.    Dispense:  180 tablet    Refill:  1  . telmisartan (MICARDIS) 80 MG tablet    Sig: TAKE ONE TABLET BY MOUTH EVERY DAY    Dispense:  90 tablet    Refill:  1  . cloNIDine (CATAPRES) 0.3 MG tablet    Sig: TAKE ONE TABLET BY MOUTH 3 TIMES DAILY.    Dispense:  270 tablet    Refill:  1  . metoprolol succinate (TOPROL-XL)  50 MG 24 hr tablet    Sig: Take 1 tablet (50 mg total) by mouth daily. Take with or immediately following a meal.    Dispense:  90 tablet    Refill:  1  . levothyroxine (SYNTHROID, LEVOTHROID) 150 MCG tablet    Sig: Take 1 tablet (150 mcg total) by mouth daily.    Dispense:  90 tablet    Refill:  1      Dr. [name] [facility]  10/24/17

## 2017-10-25 LAB — RENAL FUNCTION PANEL
ALBUMIN: 4.2 g/dL (ref 3.6–4.8)
BUN / CREAT RATIO: 11 — AB (ref 12–28)
BUN: 17 mg/dL (ref 8–27)
CALCIUM: 9.5 mg/dL (ref 8.7–10.3)
CHLORIDE: 98 mmol/L (ref 96–106)
CO2: 23 mmol/L (ref 20–29)
Creatinine, Ser: 1.5 mg/dL — ABNORMAL HIGH (ref 0.57–1.00)
GFR calc non Af Amer: 37 mL/min/{1.73_m2} — ABNORMAL LOW (ref >59)
GFR, EST AFRICAN AMERICAN: 42 mL/min/{1.73_m2} — AB (ref >59)
Glucose: 118 mg/dL — ABNORMAL HIGH (ref 65–99)
POTASSIUM: 4.9 mmol/L (ref 3.5–5.2)
Phosphorus: 3 mg/dL (ref 2.5–4.5)
Sodium: 138 mmol/L (ref 134–144)

## 2017-10-25 LAB — HEMOGLOBIN A1C
Est. average glucose Bld gHb Est-mCnc: 166 mg/dL
HEMOGLOBIN A1C: 7.4 % — AB (ref 4.8–5.6)

## 2017-10-25 LAB — TSH: TSH: 0.963 u[IU]/mL (ref 0.450–4.500)

## 2017-10-27 ENCOUNTER — Other Ambulatory Visit: Payer: Self-pay

## 2017-10-27 DIAGNOSIS — E1129 Type 2 diabetes mellitus with other diabetic kidney complication: Secondary | ICD-10-CM

## 2017-10-27 DIAGNOSIS — R809 Proteinuria, unspecified: Principal | ICD-10-CM

## 2017-10-27 MED ORDER — GLIPIZIDE ER 2.5 MG PO TB24: 2.5000 mg | ORAL_TABLET | Freq: Two times a day (BID) | ORAL | 1 refills | Status: DC

## 2017-10-31 ENCOUNTER — Other Ambulatory Visit: Payer: Self-pay

## 2017-10-31 DIAGNOSIS — R69 Illness, unspecified: Secondary | ICD-10-CM | POA: Diagnosis not present

## 2017-10-31 MED ORDER — GLIPIZIDE 5 MG PO TABS: 2.5000 mg | ORAL_TABLET | Freq: Two times a day (BID) | ORAL | 1 refills | Status: DC

## 2017-11-05 IMAGING — CT CT ABD-PELV W/O CM
2 of 4 series · 16 of 46 positions shown, 18 images · non-contrast
Comparison: CT 12/15/2014

CLINICAL DATA: Left lower quadrant abdominal pain, onset today.
Nausea and vomiting.

EXAM:
CT ABDOMEN AND PELVIS WITHOUT CONTRAST
TECHNIQUE: Multidetector CT imaging of the abdomen and pelvis was performed
following the standard protocol without IV contrast.

[Series 2: routine abd pel wo · axial · 0.98mm/px · z∈[-476,-46]mm · 13 of 94 slices shown, 15 images]
[im 4/94  soft-tissue]
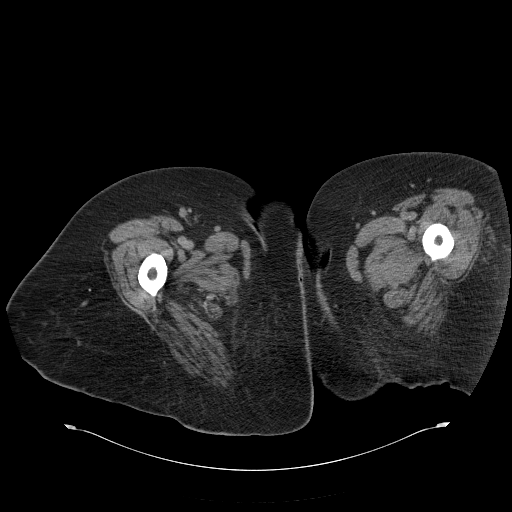
[im 4/94  bone]
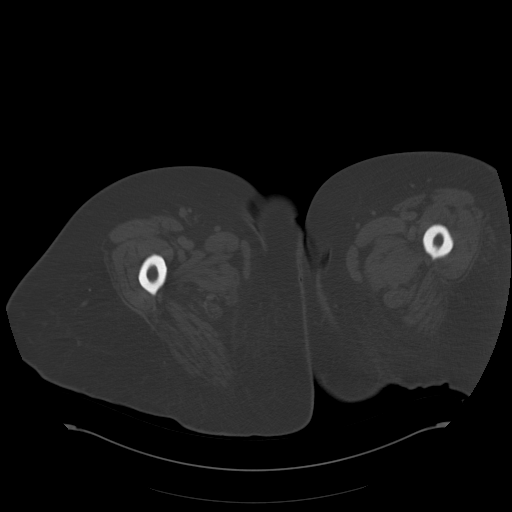
[im 12/94  soft-tissue]
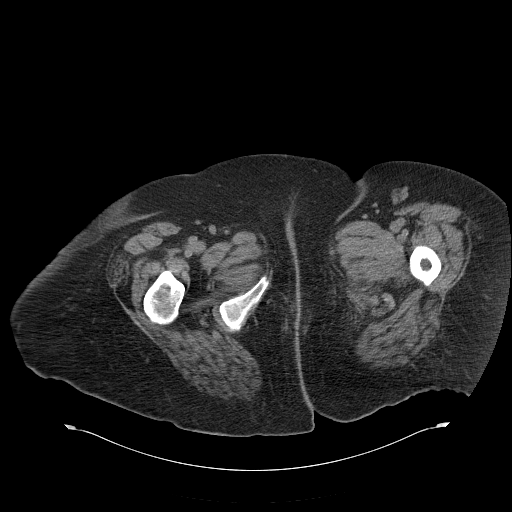
[im 20/94  soft-tissue]
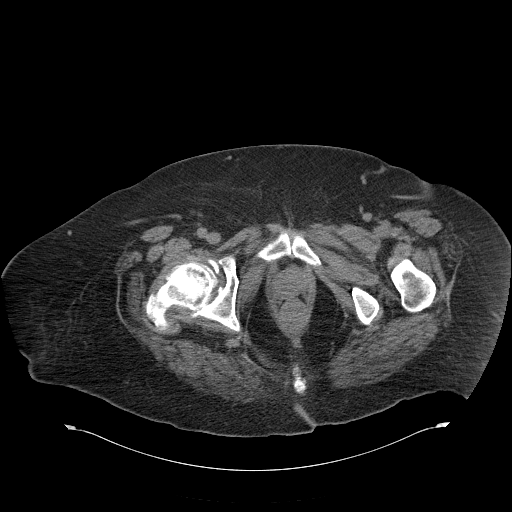
[im 28/94  soft-tissue]
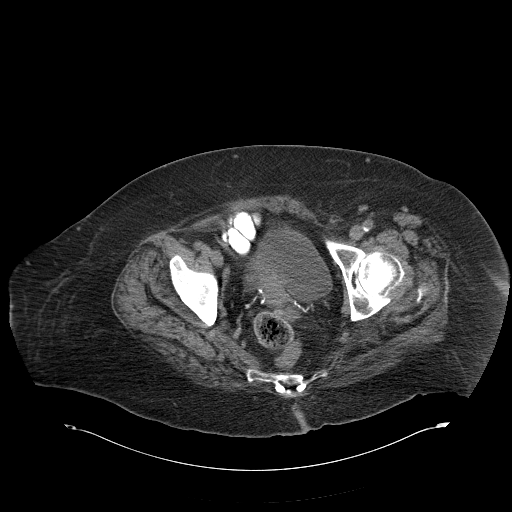
[im 32/94  soft-tissue]
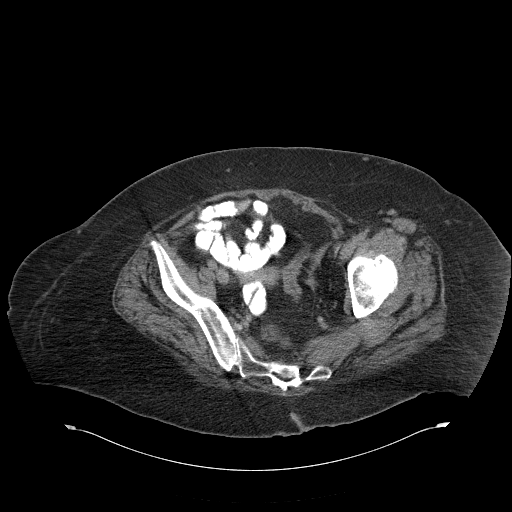
[im 39/94  soft-tissue]
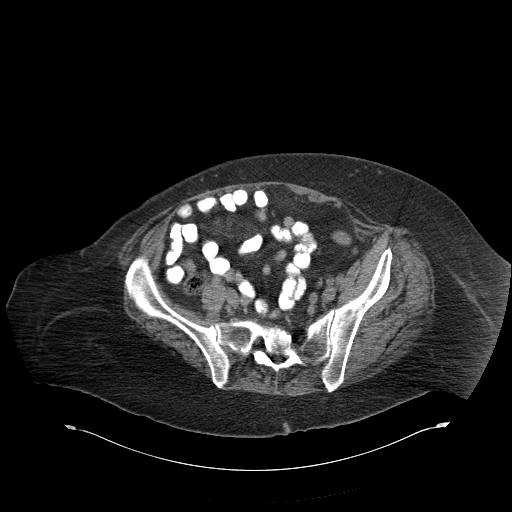
[im 47/94  soft-tissue]
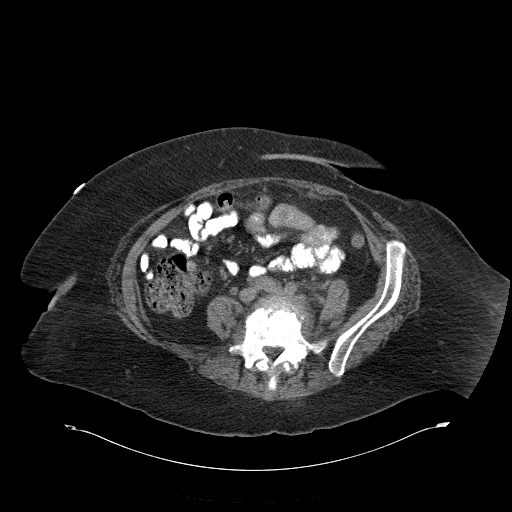
[im 55/94  soft-tissue]
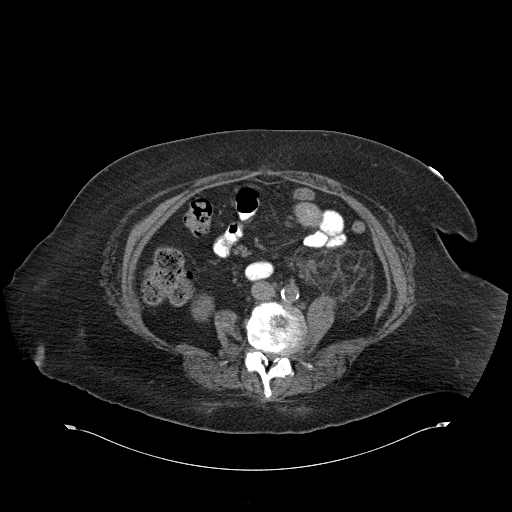
[im 63/94  soft-tissue]
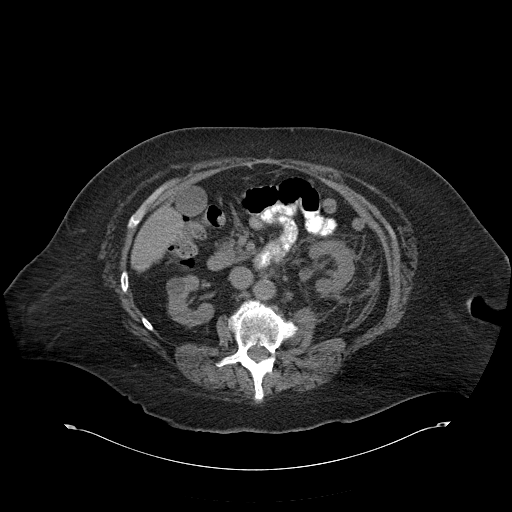
[im 63/94  bone]
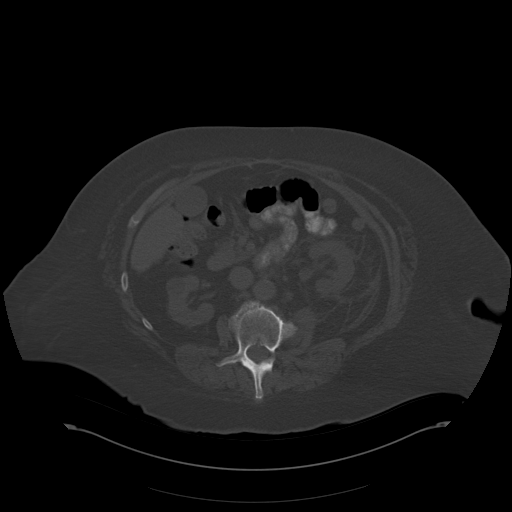
[im 66/94  soft-tissue]
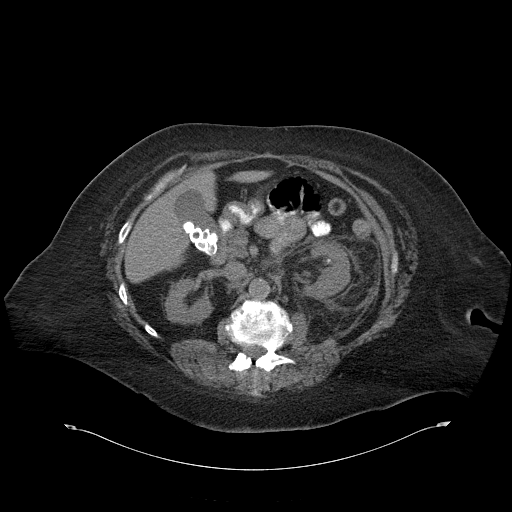
[im 74/94  soft-tissue]
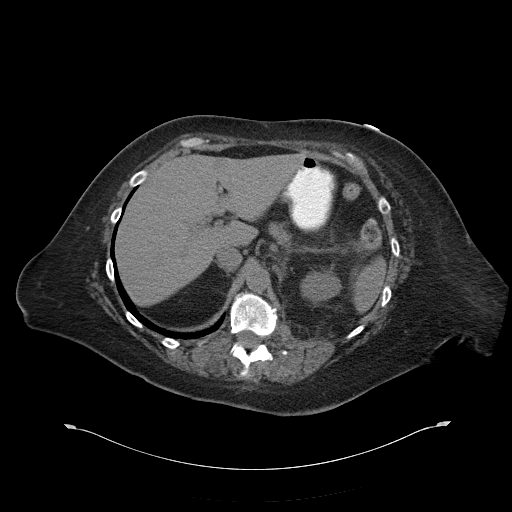
[im 82/94  soft-tissue]
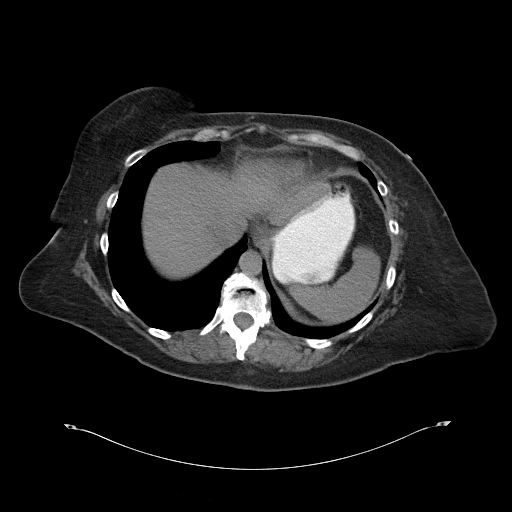
[im 90/94  soft-tissue]
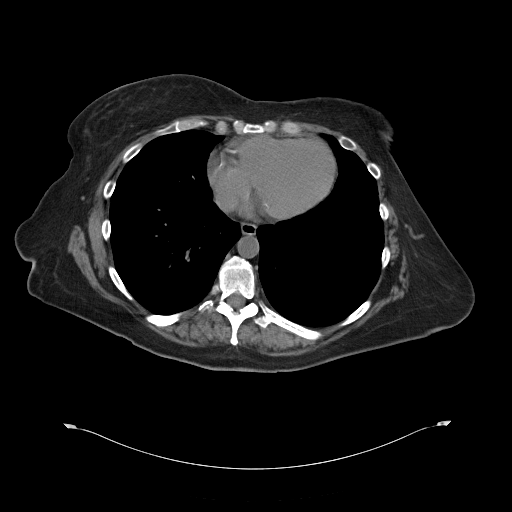

[Series 5: cor routine abd pel wo · coronal · 0.79mm/px · 3 of 158 slices shown]
[im 53/158  soft-tissue]
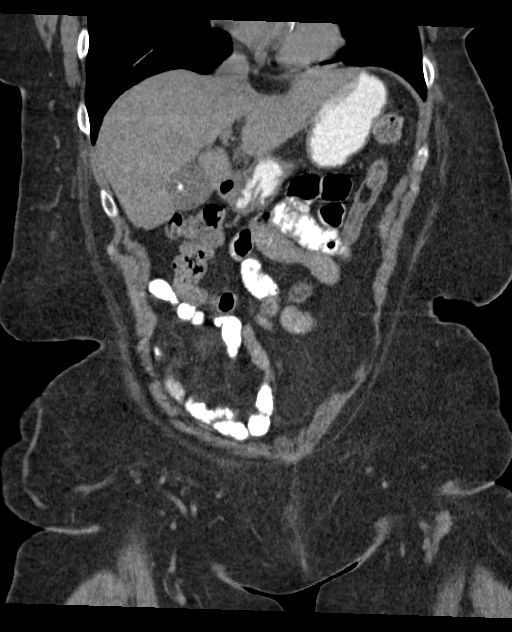
[im 70/158  soft-tissue]
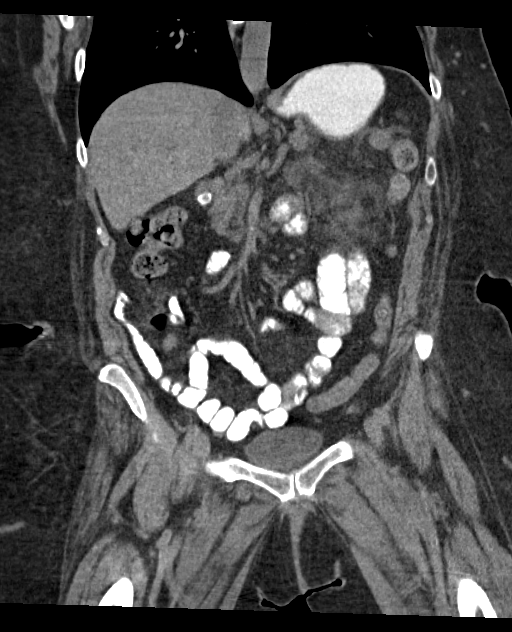
[im 88/158  soft-tissue]
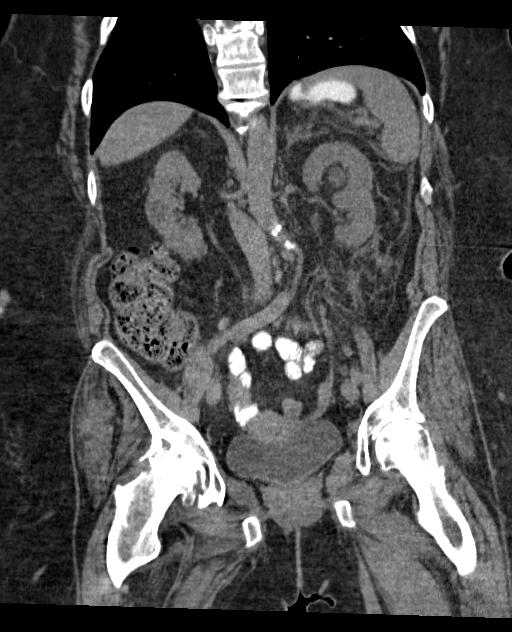

[16 of 46 positions shown; findings below may reference images not displayed]

FINDINGS: Lower chest:  The included lung bases are clear.

Liver: No focal lesion.

Hepatobiliary: Multiple calcified gallstones within the gallbladder.
No biliary dilatation or pericholecystic inflammatory change.

Pancreas: Normal.

Spleen: Normal.  Splenule at the hilum.

Adrenal glands: No nodule.

Kidneys: Left hydroureteronephrosis secondary to an obstructing 6 x
5 mm stone in the mid ureter. Significant perinephric edema. At
least 3 additional nonobstructing stones in the left kidney.
Questionable punctate stone in the lower right kidney, no right
obstructive uropathy. Simple cyst in the interpolar right kidney
measures 17 mm.

Stomach/Bowel: Stomach physiologically distended. There are no
dilated or thickened small bowel loops. Small volume of stool
throughout the colon without colonic wall thickening. The appendix
is normal.

Vascular/Lymphatic: No retroperitoneal adenopathy. Small bilateral
external iliac lymph nodes, unchanged from prior. Abdominal aorta is
normal in caliber. Mild atherosclerotic calcification and
tortuosity, no aneurysm.

Reproductive: Atrophic uterus, normal for age.  Quiescent ovaries.

Bladder: Physiologically distended, no wall thickening.

Other: No free air, free fluid, or intra-abdominal fluid collection.
Tiny fat containing umbilical hernia.

Musculoskeletal: There are no acute or suspicious osseous
abnormalities. Multilevel degenerative change throughout spine.
Vacuum phenomenon at L4-L5 with subchondral change, stable in
appearance from prior exam. Advanced osteoarthritis of the right
hip.
IMPRESSION: 1. Obstructing 6 x 7 mm stone in the left mid ureter with proximal
hydroureteronephrosis and significant perinephric stranding.
Additional nonobstructing stones in the left kidney.
2. Chronic incidental findings include cholelithiasis,
atherosclerosis, and advanced lumbar spine and right hip
degenerative change.

## 2017-12-19 ENCOUNTER — Other Ambulatory Visit: Payer: Medicare HMO

## 2017-12-19 DIAGNOSIS — R809 Proteinuria, unspecified: Secondary | ICD-10-CM

## 2017-12-19 DIAGNOSIS — E1129 Type 2 diabetes mellitus with other diabetic kidney complication: Secondary | ICD-10-CM | POA: Diagnosis not present

## 2017-12-20 LAB — HEMOGLOBIN A1C
ESTIMATED AVERAGE GLUCOSE: 134 mg/dL
Hgb A1c MFr Bld: 6.3 % — ABNORMAL HIGH (ref 4.8–5.6)

## 2017-12-21 ENCOUNTER — Other Ambulatory Visit: Payer: Self-pay

## 2017-12-21 DIAGNOSIS — R69 Illness, unspecified: Secondary | ICD-10-CM | POA: Diagnosis not present

## 2017-12-21 MED ORDER — GLIPIZIDE 5 MG PO TABS: 2.5000 mg | ORAL_TABLET | Freq: Two times a day (BID) | ORAL | 1 refills | Status: DC

## 2017-12-21 MED ORDER — GLUCOSE BLOOD VI STRP: ORAL_STRIP | 12 refills | Status: DC

## 2018-03-14 DIAGNOSIS — R69 Illness, unspecified: Secondary | ICD-10-CM | POA: Diagnosis not present

## 2018-05-03 ENCOUNTER — Encounter: Payer: Self-pay | Admitting: Family Medicine

## 2018-05-03 ENCOUNTER — Ambulatory Visit (INDEPENDENT_AMBULATORY_CARE_PROVIDER_SITE_OTHER): Payer: Medicare HMO | Admitting: Family Medicine

## 2018-05-03 VITALS — BP 140/100 | HR 76 | Ht 65.0 in | Wt 276.0 lb

## 2018-05-03 DIAGNOSIS — E039 Hypothyroidism, unspecified: Secondary | ICD-10-CM

## 2018-05-03 DIAGNOSIS — I10 Essential (primary) hypertension: Secondary | ICD-10-CM

## 2018-05-03 DIAGNOSIS — R809 Proteinuria, unspecified: Secondary | ICD-10-CM

## 2018-05-03 DIAGNOSIS — R69 Illness, unspecified: Secondary | ICD-10-CM | POA: Diagnosis not present

## 2018-05-03 DIAGNOSIS — E1129 Type 2 diabetes mellitus with other diabetic kidney complication: Secondary | ICD-10-CM

## 2018-05-03 DIAGNOSIS — R609 Edema, unspecified: Secondary | ICD-10-CM

## 2018-05-03 DIAGNOSIS — Z23 Encounter for immunization: Secondary | ICD-10-CM

## 2018-05-03 MED ORDER — CLONIDINE HCL 0.3 MG PO TABS: ORAL_TABLET | ORAL | 1 refills | Status: DC

## 2018-05-03 MED ORDER — LEVOTHYROXINE SODIUM 150 MCG PO TABS: 150.0000 ug | ORAL_TABLET | Freq: Every day | ORAL | 1 refills | Status: DC

## 2018-05-03 MED ORDER — FUROSEMIDE 40 MG PO TABS: 40.0000 mg | ORAL_TABLET | Freq: Two times a day (BID) | ORAL | 1 refills | Status: DC

## 2018-05-03 MED ORDER — METOPROLOL SUCCINATE ER 50 MG PO TB24: 50.0000 mg | ORAL_TABLET | Freq: Every day | ORAL | 1 refills | Status: DC

## 2018-05-03 MED ORDER — GLUCOSE BLOOD VI STRP: ORAL_STRIP | 12 refills | Status: DC

## 2018-05-03 MED ORDER — TELMISARTAN 80 MG PO TABS: ORAL_TABLET | ORAL | 1 refills | Status: DC

## 2018-05-03 MED ORDER — GLIPIZIDE 5 MG PO TABS: 2.5000 mg | ORAL_TABLET | Freq: Two times a day (BID) | ORAL | 1 refills | Status: DC

## 2018-05-03 NOTE — Progress Notes (Signed)
Date:  05/03/2018   Name:  [name]   DOB:  1955/08/23   MRN:  [id number]   Chief Complaint: Hypertension; Diabetes; Hypothyroidism; peripheral edema; and tdap Hypertension  This is a chronic problem. The current episode started more than 1 year ago. The problem has been gradually improving since onset. The problem is controlled. Pertinent negatives include no anxiety, blurred vision, chest pain, headaches, malaise/fatigue, neck pain, orthopnea, palpitations, peripheral edema, PND, shortness of breath or sweats. There are no associated agents to hypertension. Risk factors for coronary artery disease include diabetes mellitus, dyslipidemia, obesity and post-menopausal state. Past treatments include angiotensin blockers, beta blockers, central alpha agonists and diuretics. The current treatment provides moderate improvement. There are no compliance problems.  There is no history of angina, kidney disease, CAD/MI, CVA, heart failure, left ventricular hypertrophy, PVD or retinopathy. Identifiable causes of hypertension include a thyroid problem. There is no history of chronic renal disease, a hypertension causing med or renovascular disease.  Diabetes  She presents for her follow-up diabetic visit. She has type 2 diabetes mellitus. Her disease course has been stable. There are no hypoglycemic associated symptoms. Pertinent negatives for hypoglycemia include no confusion, dizziness, headaches, hunger, mood changes, nervousness/anxiousness, pallor, seizures, sleepiness, speech difficulty, sweats or tremors. Pertinent negatives for diabetes include no blurred vision, no chest pain, no fatigue, no foot paresthesias, no foot ulcerations, no polydipsia, no polyphagia, no polyuria, no visual change, no weakness and no weight loss. There are no hypoglycemic complications. Symptoms are improving. There are no diabetic complications. Pertinent negatives for diabetic complications include no CVA, PVD or  retinopathy. Risk factors for coronary artery disease include diabetes mellitus, dyslipidemia, hypertension and obesity. Current diabetic treatment includes oral agent (monotherapy). She is compliant with treatment all of the time. Her weight is stable. She is following a generally healthy diet. She participates in exercise intermittently. Her breakfast blood glucose is taken between 8-9 am. Her breakfast blood glucose range is generally 110-130 mg/dl. An ACE inhibitor/angiotensin II receptor blocker is being taken. She does not see a podiatrist.Eye exam is not current.  Thyroid Problem  Presents for follow-up visit. Patient reports no anxiety, cold intolerance, constipation, depressed mood, diaphoresis, diarrhea, dry skin, fatigue, hair loss, heat intolerance, hoarse voice, leg swelling, menstrual problem, nail problem, palpitations, tremors, visual change, weight gain or weight loss. The symptoms have been stable. There is no history of heart failure.     Review of Systems  Constitutional: Negative.  Negative for chills, diaphoresis, fatigue, fever, malaise/fatigue, unexpected weight change, weight gain and weight loss.  HENT: Negative for congestion, ear discharge, ear pain, hoarse voice, rhinorrhea, sinus pressure, sneezing and sore throat.   Eyes: Negative for blurred vision, photophobia, pain, discharge, redness and itching.  Respiratory: Negative for cough, shortness of breath, wheezing and stridor.   Cardiovascular: Negative for chest pain, palpitations, orthopnea and PND.  Gastrointestinal: Negative for abdominal pain, blood in stool, constipation, diarrhea, nausea and vomiting.  Endocrine: Negative for cold intolerance, heat intolerance, polydipsia, polyphagia and polyuria.  Genitourinary: Negative for dysuria, flank pain, frequency, hematuria, menstrual problem, pelvic pain, urgency, vaginal bleeding and vaginal discharge.  Musculoskeletal: Negative for arthralgias, back pain, myalgias and  neck pain.  Skin: Negative for pallor and rash.  Allergic/Immunologic: Negative for environmental allergies and food allergies.  Neurological: Negative for dizziness, tremors, seizures, speech difficulty, weakness, light-headedness, numbness and headaches.  Hematological: Negative for adenopathy. Does not bruise/bleed easily.  Psychiatric/Behavioral: Negative for confusion and dysphoric mood. The patient  is not nervous/anxious.     Patient Active Problem List   Diagnosis Date Noted  . Morbid obesity ([place]) 10/24/2017  . Chronic kidney disease, stage IV (severe) ([place]) 10/24/2017  . Hypothyroidism 11/15/2016  . Edema 11/15/2016  . Type 2 diabetes mellitus with microalbuminuria, without long-term current use of insulin ([place]) 11/15/2016  . Essential hypertension 12/09/2015  . 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 Pressure ulcer 05/31/2015  . Primary osteoarthritis of right hip 05/28/2015    No Known Allergies  Past Surgical History:  Procedure Laterality Date  . TOTAL HIP ARTHROPLASTY Right 05/28/2015   Procedure: TOTAL HIP ARTHROPLASTY ANTERIOR APPROACH;  Surgeon: Hessie Knows, MD;  Location: [facility] ORS;  Service: Orthopedics;  Laterality: Right;    Social History   Tobacco Use  . Smoking status: Never Smoker  . Smokeless tobacco: Never Used  Substance Use Topics  . Alcohol use: No  . Drug use: No     Medication list has been reviewed and updated.  Current Meds  Medication Sig  . aspirin EC 81 MG tablet Take 1 tablet (81 mg total) by mouth daily.  . cloNIDine (CATAPRES) 0.3 MG tablet TAKE ONE TABLET BY MOUTH 3 TIMES DAILY.  . furosemide (LASIX) 40 MG tablet Take 1 tablet (40 mg total) by mouth 2 (two) times daily.  Marland Kitchen glipiZIDE (GLUCOTROL) 5 MG tablet Take 0.5 tablets (2.5 mg total) by mouth 2 (two) times daily before a meal.  . glucose blood test strip Use as instructed  . levothyroxine (SYNTHROID, LEVOTHROID) 150 MCG  tablet Take 1 tablet (150 mcg total) by mouth daily.  . metoprolol succinate (TOPROL-XL) 50 MG 24 hr tablet Take 1 tablet (50 mg total) by mouth daily. Take with or immediately following a meal.  . telmisartan (MICARDIS) 80 MG tablet TAKE ONE TABLET BY MOUTH EVERY DAY    PHQ 2/9 Scores 05/03/2018 05/01/2017 01/19/2017 12/09/2015  PHQ - 2 Score 0 0 0 0  PHQ- 9 Score 0 3 3 -    Physical Exam  Constitutional: She is oriented to person, place, and time. She appears well-developed and well-nourished.  HENT:  Head: Normocephalic.  Right Ear: External ear normal.  Left Ear: External ear normal.  Mouth/Throat: Oropharynx is clear and moist.  Eyes: Pupils are equal, round, and reactive to light. Conjunctivae and EOM are normal. Lids are everted and swept, no foreign bodies found. Left eye exhibits no hordeolum. No foreign body present in the left eye. Right conjunctiva is not injected. Left conjunctiva is not injected. No scleral icterus.  Neck: Normal range of motion. Neck supple. No JVD present. No tracheal deviation present. No thyromegaly present.  Cardiovascular: Normal rate, regular rhythm, normal heart sounds and intact distal pulses. Exam reveals no gallop and no friction rub.  No murmur heard. Pulmonary/Chest: Effort normal and breath sounds normal. No respiratory distress. She has no wheezes. She has no rales.  Abdominal: Soft. Bowel sounds are normal. She exhibits no mass. There is no hepatosplenomegaly. There is no tenderness. There is no rebound and no guarding.  Musculoskeletal: Normal range of motion. She exhibits no edema or tenderness.  Lymphadenopathy:    She has no cervical adenopathy.  Neurological: She is alert and oriented to person, place, and time. She has normal strength. She displays normal reflexes. No cranial nerve deficit.  Skin: Skin is warm. No rash noted.  Psychiatric: She has a normal mood and affect. Her mood appears not anxious. She  does not exhibit a depressed mood.   Nursing note and vitals reviewed.   BP (!) 140/100   Pulse 76   Ht 5\' 5"  (1.651 m)   Wt 276 lb (125.2 kg)   BMI 45.93 kg/m   Assessment and Plan:  1. Edema, unspecified type Chronic.  Controlled.  Continue Lasix 40 mg 1 a day - furosemide (LASIX) 40 MG tablet; Take 1 tablet (40 mg total) by mouth 2 (two) times daily.  Dispense: 180 tablet; Refill: 1  2. Essential hypertension Chronic.  Stable.  Controlled.  Continue furosemide, Toprol all 50 mg 1 a day Micardis 80 mg 1 a day Catapres 0.3 mg 1 3 times a day check renal function panel - furosemide (LASIX) 40 MG tablet; Take 1 tablet (40 mg total) by mouth 2 (two) times daily.  Dispense: 180 tablet; Refill: 1 - metoprolol succinate (TOPROL-XL) 50 MG 24 hr tablet; Take 1 tablet (50 mg total) by mouth daily. Take with or immediately following a meal.  Dispense: 90 tablet; Refill: 1 - telmisartan (MICARDIS) 80 MG tablet; TAKE ONE TABLET BY MOUTH EVERY DAY  Dispense: 90 tablet; Refill: 1 - cloNIDine (CATAPRES) 0.3 MG tablet; TAKE ONE TABLET BY MOUTH 3 TIMES DAILY.  Dispense: 270 tablet; Refill: 1 - Renal Function Panel  3. Hypothyroidism, unspecified type Chronic.  Controlled continue levothyroxine 150 mcg 1 a day.  Check TSH. - levothyroxine (SYNTHROID, LEVOTHROID) 150 MCG tablet; Take 1 tablet (150 mcg total) by mouth daily.  Dispense: 90 tablet; Refill: 1 - TSH  4. Need for diphtheria-tetanus-pertussis (Tdap) vaccine Discussed and administered. - Tdap vaccine greater than or equal to 7yo IM  5. Type 2 diabetes mellitus with microalbuminuria, unspecified whether long term insulin use (HCC) Chronic.  Will check A1c and adjust accordingly.  Continue glipizide 5 mg 1 a day. - Hemoglobin A1c  Dr. [facility]  05/03/2018

## 2018-05-03 NOTE — Progress Notes (Signed)
Date:  05/03/2018   Name:  [name]   DOB:  1955-12-01   MRN:  [id number]   Chief Complaint: Hypertension; Diabetes; Hypothyroidism; peripheral edema; and tdap Hypertension  This is a chronic problem. The current episode started more than 1 year ago. The problem has been gradually improving since onset. The problem is controlled. Pertinent negatives include no anxiety, blurred vision, chest pain, headaches, malaise/fatigue, neck pain, orthopnea, palpitations, peripheral edema, PND, shortness of breath or sweats. There are no associated agents to hypertension. Risk factors for coronary artery disease include diabetes mellitus, dyslipidemia, obesity and post-menopausal state. Past treatments include angiotensin blockers, beta blockers, central alpha agonists and diuretics. The current treatment provides moderate improvement. There are no compliance problems.  There is no history of angina, kidney disease, CAD/MI, CVA, heart failure, left ventricular hypertrophy, PVD or retinopathy. Identifiable causes of hypertension include a thyroid problem. There is no history of chronic renal disease, a hypertension causing med or renovascular disease.  Diabetes  She presents for her follow-up diabetic visit. She has type 2 diabetes mellitus. Her disease course has been stable. There are no hypoglycemic associated symptoms. Pertinent negatives for hypoglycemia include no confusion, dizziness, headaches, hunger, mood changes, nervousness/anxiousness, pallor, seizures, sleepiness, speech difficulty, sweats or tremors. Pertinent negatives for diabetes include no blurred vision, no chest pain, no fatigue, no foot paresthesias, no foot ulcerations, no polydipsia, no polyphagia, no polyuria, no visual change, no weakness and no weight loss. There are no hypoglycemic complications. Symptoms are improving. There are no diabetic complications. Pertinent negatives for diabetic complications include no CVA, PVD or  retinopathy. Risk factors for coronary artery disease include diabetes mellitus, dyslipidemia, hypertension and obesity. Current diabetic treatment includes oral agent (monotherapy). She is compliant with treatment all of the time. Her weight is stable. She is following a generally healthy diet. She participates in exercise intermittently. Her breakfast blood glucose is taken between 8-9 am. Her breakfast blood glucose range is generally 110-130 mg/dl. An ACE inhibitor/angiotensin II receptor blocker is being taken. She does not see a podiatrist.Eye exam is not current.  Thyroid Problem  Presents for follow-up visit. Patient reports no anxiety, cold intolerance, constipation, depressed mood, diaphoresis, diarrhea, dry skin, fatigue, hair loss, heat intolerance, hoarse voice, leg swelling, menstrual problem, nail problem, palpitations, tremors, visual change, weight gain or weight loss. The symptoms have been stable. There is no history of heart failure.     Review of Systems  Constitutional: Negative.  Negative for chills, diaphoresis, fatigue, fever, malaise/fatigue, unexpected weight change, weight gain and weight loss.  HENT: Negative for congestion, ear discharge, ear pain, hoarse voice, rhinorrhea, sinus pressure, sneezing and sore throat.   Eyes: Negative for blurred vision, photophobia, pain, discharge, redness and itching.  Respiratory: Negative for cough, shortness of breath, wheezing and stridor.   Cardiovascular: Negative for chest pain, palpitations, orthopnea and PND.  Gastrointestinal: Negative for abdominal pain, blood in stool, constipation, diarrhea, nausea and vomiting.  Endocrine: Negative for cold intolerance, heat intolerance, polydipsia, polyphagia and polyuria.  Genitourinary: Negative for dysuria, flank pain, frequency, hematuria, menstrual problem, pelvic pain, urgency, vaginal bleeding and vaginal discharge.  Musculoskeletal: Negative for arthralgias, back pain, myalgias and  neck pain.  Skin: Negative for pallor and rash.  Allergic/Immunologic: Negative for environmental allergies and food allergies.  Neurological: Negative for dizziness, tremors, seizures, speech difficulty, weakness, light-headedness, numbness and headaches.  Hematological: Negative for adenopathy. Does not bruise/bleed easily.  Psychiatric/Behavioral: Negative for confusion and dysphoric mood. The patient  is not nervous/anxious.     Patient Active Problem List   Diagnosis Date Noted  . Morbid obesity ([place]) 10/24/2017  . Chronic kidney disease, stage IV (severe) ([place]) 10/24/2017  . Hypothyroidism 11/15/2016  . Edema 11/15/2016  . Type 2 diabetes mellitus with microalbuminuria, without long-term current use of insulin ([place]) 11/15/2016  . Essential hypertension 12/09/2015  . 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 ([place]) 06/26/2015  . Pressure ulcer 05/31/2015  . Primary osteoarthritis of right hip 05/28/2015    No Known Allergies  Past Surgical History:  Procedure Laterality Date  . TOTAL HIP ARTHROPLASTY Right 05/28/2015   Procedure: TOTAL HIP ARTHROPLASTY ANTERIOR APPROACH;  Surgeon: Hessie Knows, MD;  Location: [facility] ORS;  Service: Orthopedics;  Laterality: Right;    Social History   Tobacco Use  . Smoking status: Never Smoker  . Smokeless tobacco: Never Used  Substance Use Topics  . Alcohol use: No  . Drug use: No     Medication list has been reviewed and updated.  Current Meds  Medication Sig  . aspirin EC 81 MG tablet Take 1 tablet (81 mg total) by mouth daily.  . cloNIDine (CATAPRES) 0.3 MG tablet TAKE ONE TABLET BY MOUTH 3 TIMES DAILY.  . furosemide (LASIX) 40 MG tablet Take 1 tablet (40 mg total) by mouth 2 (two) times daily.  Marland Kitchen glipiZIDE (GLUCOTROL) 5 MG tablet Take 0.5 tablets (2.5 mg total) by mouth 2 (two) times daily before a meal.  . glucose blood test strip Use as instructed  . levothyroxine (SYNTHROID, LEVOTHROID) 150 MCG  tablet Take 1 tablet (150 mcg total) by mouth daily.  . metoprolol succinate (TOPROL-XL) 50 MG 24 hr tablet Take 1 tablet (50 mg total) by mouth daily. Take with or immediately following a meal.  . telmisartan (MICARDIS) 80 MG tablet TAKE ONE TABLET BY MOUTH EVERY DAY  . [DISCONTINUED] cloNIDine (CATAPRES) 0.3 MG tablet TAKE ONE TABLET BY MOUTH 3 TIMES DAILY.  . [DISCONTINUED] furosemide (LASIX) 40 MG tablet Take 1 tablet (40 mg total) by mouth 2 (two) times daily.  . [DISCONTINUED] glipiZIDE (GLUCOTROL) 5 MG tablet Take 0.5 tablets (2.5 mg total) by mouth 2 (two) times daily before a meal.  . [DISCONTINUED] glucose blood test strip Use as instructed  . [DISCONTINUED] levothyroxine (SYNTHROID, LEVOTHROID) 150 MCG tablet Take 1 tablet (150 mcg total) by mouth daily.  . [DISCONTINUED] metoprolol succinate (TOPROL-XL) 50 MG 24 hr tablet Take 1 tablet (50 mg total) by mouth daily. Take with or immediately following a meal.  . [DISCONTINUED] telmisartan (MICARDIS) 80 MG tablet TAKE ONE TABLET BY MOUTH EVERY DAY    PHQ 2/9 Scores 05/03/2018 05/01/2017 01/19/2017 12/09/2015  PHQ - 2 Score 0 0 0 0  PHQ- 9 Score 0 3 3 -    Physical Exam  Constitutional: She is oriented to person, place, and time. She appears well-developed and well-nourished.  HENT:  Head: Normocephalic.  Right Ear: External ear normal.  Left Ear: External ear normal.  Mouth/Throat: Oropharynx is clear and moist.  Eyes: Pupils are equal, round, and reactive to light. Conjunctivae and EOM are normal. Lids are everted and swept, no foreign bodies found. Left eye exhibits no hordeolum. No foreign body present in the left eye. Right conjunctiva is not injected. Left conjunctiva is not injected. No scleral icterus.  Neck: Normal range of motion. Neck supple. No JVD present. No tracheal deviation present. No thyromegaly present.  Cardiovascular: Normal rate, regular rhythm, normal heart sounds  and intact distal pulses. Exam reveals no gallop  and no friction rub.  No murmur heard. Pulmonary/Chest: Effort normal and breath sounds normal. No respiratory distress. She has no wheezes. She has no rales.  Abdominal: Soft. Bowel sounds are normal. She exhibits no mass. There is no hepatosplenomegaly. There is no tenderness. There is no rebound and no guarding.  Musculoskeletal: Normal range of motion. She exhibits no edema or tenderness.  Lymphadenopathy:    She has no cervical adenopathy.  Neurological: She is alert and oriented to person, place, and time. She has normal strength. She displays normal reflexes. No cranial nerve deficit.  Skin: Skin is warm. No rash noted.  Psychiatric: She has a normal mood and affect. Her mood appears not anxious. She does not exhibit a depressed mood.  Nursing note and vitals reviewed.   BP (!) 140/100   Pulse 76   Ht 5\' 5"  (1.651 m)   Wt 276 lb (125.2 kg)   BMI 45.93 kg/m   Assessment and Plan:  1. Edema, unspecified type Chronic.  Controlled.  Continue Lasix 40 mg 1 a day - furosemide (LASIX) 40 MG tablet; Take 1 tablet (40 mg total) by mouth 2 (two) times daily.  Dispense: 180 tablet; Refill: 1  2. Essential hypertension Chronic.  Stable.  Controlled.  Continue furosemide, Toprol all 50 mg 1 a day Micardis 80 mg 1 a day Catapres 0.3 mg 1 3 times a day check renal function panel - furosemide (LASIX) 40 MG tablet; Take 1 tablet (40 mg total) by mouth 2 (two) times daily.  Dispense: 180 tablet; Refill: 1 - metoprolol succinate (TOPROL-XL) 50 MG 24 hr tablet; Take 1 tablet (50 mg total) by mouth daily. Take with or immediately following a meal.  Dispense: 90 tablet; Refill: 1 - telmisartan (MICARDIS) 80 MG tablet; TAKE ONE TABLET BY MOUTH EVERY DAY  Dispense: 90 tablet; Refill: 1 - cloNIDine (CATAPRES) 0.3 MG tablet; TAKE ONE TABLET BY MOUTH 3 TIMES DAILY.  Dispense: 270 tablet; Refill: 1 - Renal Function Panel  3. Hypothyroidism, unspecified type Chronic.  Controlled continue levothyroxine  150 mcg 1 a day.  Check TSH. - levothyroxine (SYNTHROID, LEVOTHROID) 150 MCG tablet; Take 1 tablet (150 mcg total) by mouth daily.  Dispense: 90 tablet; Refill: 1 - TSH  4. Need for diphtheria-tetanus-pertussis (Tdap) vaccine Discussed and administered. - Tdap vaccine greater than or equal to 7yo IM  5. Type 2 diabetes mellitus with microalbuminuria, unspecified whether long term insulin use (HCC) Chronic.  Will check A1c and adjust accordingly.  Continue glipizide 5 mg 1 a day. - Hemoglobin A1c  Dr. [facility]  05/03/2018

## 2018-05-04 LAB — RENAL FUNCTION PANEL
Albumin: 4.4 g/dL (ref 3.6–4.8)
BUN/Creatinine Ratio: 17 (ref 12–28)
BUN: 27 mg/dL (ref 8–27)
CALCIUM: 9.6 mg/dL (ref 8.7–10.3)
CO2: 21 mmol/L (ref 20–29)
Chloride: 101 mmol/L (ref 96–106)
Creatinine, Ser: 1.62 mg/dL — ABNORMAL HIGH (ref 0.57–1.00)
GFR calc Af Amer: 38 mL/min/{1.73_m2} — ABNORMAL LOW (ref >59)
GFR calc non Af Amer: 33 mL/min/{1.73_m2} — ABNORMAL LOW (ref >59)
GLUCOSE: 90 mg/dL (ref 65–99)
PHOSPHORUS: 3.2 mg/dL (ref 2.5–4.5)
POTASSIUM: 4.9 mmol/L (ref 3.5–5.2)
Sodium: 138 mmol/L (ref 134–144)

## 2018-05-04 LAB — HEMOGLOBIN A1C
Est. average glucose Bld gHb Est-mCnc: 131 mg/dL
Hgb A1c MFr Bld: 6.2 % — ABNORMAL HIGH (ref 4.8–5.6)

## 2018-05-04 LAB — TSH: TSH: 0.37 u[IU]/mL — ABNORMAL LOW (ref 0.450–4.500)

## 2018-10-26 ENCOUNTER — Encounter: Payer: Self-pay | Admitting: Family Medicine

## 2018-10-26 ENCOUNTER — Ambulatory Visit (INDEPENDENT_AMBULATORY_CARE_PROVIDER_SITE_OTHER): Payer: Medicare HMO | Admitting: Family Medicine

## 2018-10-26 ENCOUNTER — Other Ambulatory Visit: Payer: Self-pay

## 2018-10-26 DIAGNOSIS — N184 Chronic kidney disease, stage 4 (severe): Secondary | ICD-10-CM

## 2018-10-26 DIAGNOSIS — I10 Essential (primary) hypertension: Secondary | ICD-10-CM | POA: Diagnosis not present

## 2018-10-26 DIAGNOSIS — R809 Proteinuria, unspecified: Secondary | ICD-10-CM | POA: Diagnosis not present

## 2018-10-26 DIAGNOSIS — R609 Edema, unspecified: Secondary | ICD-10-CM

## 2018-10-26 DIAGNOSIS — E039 Hypothyroidism, unspecified: Secondary | ICD-10-CM | POA: Diagnosis not present

## 2018-10-26 DIAGNOSIS — E1129 Type 2 diabetes mellitus with other diabetic kidney complication: Secondary | ICD-10-CM

## 2018-10-26 MED ORDER — LEVOTHYROXINE SODIUM 150 MCG PO TABS: 150.0000 ug | ORAL_TABLET | Freq: Every day | ORAL | 1 refills | Status: DC

## 2018-10-26 MED ORDER — METOPROLOL SUCCINATE ER 50 MG PO TB24: 50.0000 mg | ORAL_TABLET | Freq: Every day | ORAL | 1 refills | Status: DC

## 2018-10-26 MED ORDER — GLIPIZIDE 5 MG PO TABS: 2.5000 mg | ORAL_TABLET | Freq: Two times a day (BID) | ORAL | 1 refills | Status: DC

## 2018-10-26 MED ORDER — TELMISARTAN 80 MG PO TABS: ORAL_TABLET | ORAL | 1 refills | Status: DC

## 2018-10-26 MED ORDER — CLONIDINE HCL 0.3 MG PO TABS: ORAL_TABLET | ORAL | 1 refills | Status: DC

## 2018-10-26 MED ORDER — FUROSEMIDE 40 MG PO TABS: 40.0000 mg | ORAL_TABLET | Freq: Two times a day (BID) | ORAL | 1 refills | Status: DC

## 2018-10-26 NOTE — Progress Notes (Signed)
Date:  10/26/2018   Name:  [name]   DOB:  April 26, 1956   MRN:  [id number]   Chief Complaint: Diabetes (needs foot exam); Hypertension; Hypothyroidism; and Leg Swelling (furosemide refill)  Diabetes  She presents for her follow-up diabetic visit. She has type 2 diabetes mellitus. Her disease course has been fluctuating. There are no hypoglycemic associated symptoms. Pertinent negatives for hypoglycemia include no headaches or sweats. Associated symptoms include weight loss. Pertinent negatives for diabetes include no blurred vision, no chest pain and no fatigue. There are no hypoglycemic complications. There are no diabetic complications. Pertinent negatives for diabetic complications include no CVA, PVD or retinopathy. Risk factors for coronary artery disease include diabetes mellitus, dyslipidemia and hypertension. Current diabetic treatment includes oral agent (monotherapy). She is compliant with treatment some of the time. She is following a generally unhealthy diet. Meal planning includes avoidance of concentrated sweets. She rarely participates in exercise. Her home blood glucose trend is fluctuating minimally. Her breakfast blood glucose is taken between 8-9 am. Her breakfast blood glucose range is generally 110-130 mg/dl. (Last time checked) An ACE inhibitor/angiotensin II receptor blocker is being taken. Eye exam is not current.  Hypertension  This is a chronic problem. The current episode started more than 1 year ago. The problem is unchanged. The problem is controlled. Pertinent negatives include no anxiety, blurred vision, chest pain, headaches, malaise/fatigue, neck pain, orthopnea, palpitations, peripheral edema, PND, shortness of breath or sweats. There are no associated agents to hypertension. Past treatments include central alpha agonists, beta blockers, angiotensin blockers and diuretics. The current treatment provides moderate improvement. There are no compliance problems.   There is no history of angina, kidney disease, CVA, heart failure, PVD or retinopathy. Identifiable causes of hypertension include a thyroid problem. There is no history of chronic renal disease, a hypertension causing med or renovascular disease.  Thyroid Problem  Presents for follow-up visit. Symptoms include weight loss. Patient reports no constipation, depressed mood, diaphoresis, diarrhea, fatigue, heat intolerance, hoarse voice, leg swelling, palpitations or weight gain. The symptoms have been improving. Her past medical history is significant for hyperlipidemia. There is no history of heart failure.  Hyperlipidemia  This is a chronic problem. The current episode started more than 1 month ago. The problem is controlled. Recent lipid tests were reviewed and are normal. She has no history of chronic renal disease. There are no known factors aggravating her hyperlipidemia. Pertinent negatives include no chest pain, focal sensory loss, focal weakness, leg pain, myalgias or shortness of breath. There are no compliance problems.  Risk factors for coronary artery disease include diabetes mellitus, hypertension and obesity.    Review of Systems  Constitutional: Positive for weight loss. Negative for diaphoresis, fatigue, malaise/fatigue and weight gain.  HENT: Negative for hoarse voice.   Eyes: Negative for blurred vision.  Respiratory: Negative for shortness of breath.   Cardiovascular: Negative for chest pain, palpitations, orthopnea and PND.  Gastrointestinal: Negative for constipation and diarrhea.  Endocrine: Negative for heat intolerance.  Musculoskeletal: Negative for myalgias and neck pain.  Neurological: Negative for focal weakness and headaches.    Patient Active Problem List   Diagnosis Date Noted  . Morbid obesity ([place]) 10/24/2017  . Chronic kidney disease, stage IV (severe) ([place]) 10/24/2017  . Hypothyroidism 11/15/2016  . Edema 11/15/2016  . Type 2 diabetes mellitus with  microalbuminuria, without long-term current use of insulin ([place]) 11/15/2016  . Essential hypertension 12/09/2015  . 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 Pressure ulcer 05/31/2015  . Primary osteoarthritis of right hip 05/28/2015    No Known Allergies  Past Surgical History:  Procedure Laterality Date  . TOTAL HIP ARTHROPLASTY Right 05/28/2015   Procedure: TOTAL HIP ARTHROPLASTY ANTERIOR APPROACH;  Surgeon: Hessie Knows, MD;  Location: [facility] ORS;  Service: Orthopedics;  Laterality: Right;    Social History   Tobacco Use  . Smoking status: Never Smoker  . Smokeless tobacco: Never Used  Substance Use Topics  . Alcohol use: No  . Drug use: No     Medication list has been reviewed and updated.  Current Meds  Medication Sig  . aspirin EC 81 MG tablet Take 1 tablet (81 mg total) by mouth daily.  . cloNIDine (CATAPRES) 0.3 MG tablet TAKE ONE TABLET BY MOUTH 3 TIMES DAILY.  . furosemide (LASIX) 40 MG tablet Take 1 tablet (40 mg total) by mouth 2 (two) times daily.  Marland Kitchen glipiZIDE (GLUCOTROL) 5 MG tablet Take 0.5 tablets (2.5 mg total) by mouth 2 (two) times daily before a meal.  . glucose blood test strip Use as instructed  . levothyroxine (SYNTHROID, LEVOTHROID) 150 MCG tablet Take 1 tablet (150 mcg total) by mouth daily.  . metoprolol succinate (TOPROL-XL) 50 MG 24 hr tablet Take 1 tablet (50 mg total) by mouth daily. Take with or immediately following a meal.  . telmisartan (MICARDIS) 80 MG tablet TAKE ONE TABLET BY MOUTH EVERY DAY    PHQ 2/9 Scores 05/03/2018 05/01/2017 01/19/2017 12/09/2015  PHQ - 2 Score 0 0 0 0  PHQ- 9 Score 0 3 3 -    BP Readings from Last 3 Encounters:  10/26/18 120/70  05/03/18 (!) 140/100  10/24/17 138/90    Physical Exam Constitutional:      General: She is not in acute distress.    Appearance: She is not diaphoretic.  HENT:     Head: Normocephalic and atraumatic.     Right Ear: External ear  normal.     Left Ear: External ear normal.     Nose: Nose normal.  Eyes:     General:        Right eye: No discharge.        Left eye: No discharge.     Conjunctiva/sclera: Conjunctivae normal.     Pupils: Pupils are equal, round, and reactive to light.  Neck:     Musculoskeletal: Normal range of motion and neck supple.     Thyroid: No thyromegaly.     Vascular: No JVD.  Cardiovascular:     Rate and Rhythm: Normal rate and regular rhythm.     Pulses:          Dorsalis pedis pulses are 1+ on the right side and 1+ on the left side.       Posterior tibial pulses are 1+ on the right side and 1+ on the left side.     Heart sounds: Normal heart sounds. No murmur. No friction rub. No gallop.   Pulmonary:     Effort: Pulmonary effort is normal.     Breath sounds: Normal breath sounds.  Abdominal:     General: Bowel sounds are normal.     Palpations: Abdomen is soft. There is no mass.     Tenderness: There is no abdominal tenderness. There is no guarding.  Musculoskeletal: Normal range of motion.     Right foot: Normal range of motion.     Left foot: Normal range of  motion.       Feet:  Feet:     Right foot:     Protective Sensation: 2 sites tested. 3 sites sensed.     Skin integrity: Skin integrity normal.     Left foot:     Protective Sensation: 2 sites tested. 2 sites sensed.     Skin integrity: Skin integrity normal.  Lymphadenopathy:     Cervical: No cervical adenopathy.  Skin:    General: Skin is warm and dry.  Neurological:     Mental Status: She is alert.     Deep Tendon Reflexes: Reflexes are normal and symmetric.     Wt Readings from Last 3 Encounters:  10/26/18 270 lb (122.5 kg)  05/03/18 276 lb (125.2 kg)  10/24/17 282 lb (127.9 kg)    BP 120/70   Pulse 68   Ht 5\' 5"  (1.651 m)   Wt 270 lb (122.5 kg)   BMI 44.93 kg/m   Assessment and Plan:  1. Essential hypertension Chronic.  Controlled.  Continue clonidine 0.3 3 times daily furosemide 40 mg once a  day as well as metoprolol 50 mg XL once a day and Micardis 80 mg once a day.  Will check renal function panel. - cloNIDine (CATAPRES) 0.3 MG tablet; TAKE ONE TABLET BY MOUTH 3 TIMES DAILY.  Dispense: 270 tablet; Refill: 1 - furosemide (LASIX) 40 MG tablet; Take 1 tablet (40 mg total) by mouth 2 (two) times daily.  Dispense: 180 tablet; Refill: 1 - metoprolol succinate (TOPROL-XL) 50 MG 24 hr tablet; Take 1 tablet (50 mg total) by mouth daily. Take with or immediately following a meal.  Dispense: 90 tablet; Refill: 1 - telmisartan (MICARDIS) 80 MG tablet; TAKE ONE TABLET BY MOUTH EVERY DAY  Dispense: 90 tablet; Refill: 1 - Renal Function Panel Patient still has 1+ edema but this is significantly improved and from in the past 2. Edema, unspecified type Patient still has 1+ edema but this is significantly improved from the past evaluations.  We will continue furosemide 40 mg once a day. - furosemide (LASIX) 40 MG tablet; Take 1 tablet (40 mg total) by mouth 2 (two) times daily.  Dispense: 180 tablet; Refill: 1  3. Hypothyroidism, unspecified type Chronic.  Controlled.  Continue Synthroid 150 mcg daily as well as we will check a TSH and adjust if needed. - levothyroxine (SYNTHROID) 150 MCG tablet; Take 1 tablet (150 mcg total) by mouth daily.  Dispense: 90 tablet; Refill: 1  4. Type 2 diabetes mellitus with microalbuminuria, unspecified whether long term insulin use (HCC) Chronic.  Controlled.  Will check A1c microalbuminuria and renal function panel.  Continue Glucotrol 5 mg once a day.  Patient had refill of glucometer and glucose strips for which she has not been able to use due to mechanical concerns. - HgB A1c - Microalbumin, urine - Lipid Panel With LDL/HDL Ratio - Renal Function Panel  5. Morbid obesity ([place]) Health risks of being over weight were discussed and patient was counseled on weight loss options and exercise.  6. Chronic kidney disease, stage IV (severe) ([place]) Patient is  followed by nephrology.  Which recheck a renal function panel convey results. - Renal Function Panel

## 2018-10-30 LAB — RENAL FUNCTION PANEL
Albumin: 4.3 g/dL (ref 3.8–4.8)
BUN/Creatinine Ratio: 18 (ref 12–28)
BUN: 33 mg/dL — ABNORMAL HIGH (ref 8–27)
CO2: 20 mmol/L (ref 20–29)
Calcium: 9.4 mg/dL (ref 8.7–10.3)
Chloride: 100 mmol/L (ref 96–106)
Creatinine, Ser: 1.82 mg/dL — ABNORMAL HIGH (ref 0.57–1.00)
GFR calc Af Amer: 33 mL/min/{1.73_m2} — ABNORMAL LOW (ref >59)
GFR calc non Af Amer: 29 mL/min/{1.73_m2} — ABNORMAL LOW (ref >59)
Glucose: 108 mg/dL — ABNORMAL HIGH (ref 65–99)
Phosphorus: 3 mg/dL (ref 3.0–4.3)
Potassium: 4.5 mmol/L (ref 3.5–5.2)
Sodium: 140 mmol/L (ref 134–144)

## 2018-10-30 LAB — LIPID PANEL WITH LDL/HDL RATIO
Cholesterol, Total: 200 mg/dL — ABNORMAL HIGH (ref 100–199)
HDL: 42 mg/dL (ref >39)
LDL Calculated: 142 mg/dL — ABNORMAL HIGH (ref 0–99)
LDl/HDL Ratio: 3.4 ratio — ABNORMAL HIGH (ref 0.0–3.2)
Triglycerides: 79 mg/dL (ref 0–149)
VLDL Cholesterol Cal: 16 mg/dL (ref 5–40)

## 2018-10-30 LAB — HEMOGLOBIN A1C
Est. average glucose Bld gHb Est-mCnc: 134 mg/dL
Hgb A1c MFr Bld: 6.3 % — ABNORMAL HIGH (ref 4.8–5.6)

## 2018-10-30 LAB — MICROALBUMIN, URINE

## 2019-02-26 ENCOUNTER — Ambulatory Visit: Payer: Medicare HMO | Admitting: Family Medicine

## 2019-04-17 ENCOUNTER — Telehealth: Payer: Self-pay | Admitting: Family Medicine

## 2019-04-17 NOTE — Telephone Encounter (Signed)
°  Called to schedule Medicare Annual Wellness Visit with Nurse Health Advisor, Clemetine Marker at [facility]. If patient returns call, please schedule AWV-i with [facility] ~ Any date on [facility] schedule (M or Wed)   Questions regarding scheduling, please call  [phone] or Skype > [email or web address]   [place]  ??[email or web address]   ??[id number]   1Skype

## 2019-04-30 ENCOUNTER — Other Ambulatory Visit: Payer: Self-pay

## 2019-04-30 ENCOUNTER — Encounter: Payer: Self-pay | Admitting: Family Medicine

## 2019-04-30 ENCOUNTER — Ambulatory Visit (INDEPENDENT_AMBULATORY_CARE_PROVIDER_SITE_OTHER): Payer: Medicare HMO | Admitting: Family Medicine

## 2019-04-30 VITALS — BP 160/90 | HR 80 | Ht 65.0 in | Wt 276.0 lb

## 2019-04-30 DIAGNOSIS — E1129 Type 2 diabetes mellitus with other diabetic kidney complication: Secondary | ICD-10-CM | POA: Diagnosis not present

## 2019-04-30 DIAGNOSIS — R809 Proteinuria, unspecified: Secondary | ICD-10-CM

## 2019-04-30 DIAGNOSIS — R609 Edema, unspecified: Secondary | ICD-10-CM | POA: Diagnosis not present

## 2019-04-30 DIAGNOSIS — I10 Essential (primary) hypertension: Secondary | ICD-10-CM

## 2019-04-30 DIAGNOSIS — E039 Hypothyroidism, unspecified: Secondary | ICD-10-CM | POA: Diagnosis not present

## 2019-04-30 DIAGNOSIS — N184 Chronic kidney disease, stage 4 (severe): Secondary | ICD-10-CM | POA: Diagnosis not present

## 2019-04-30 DIAGNOSIS — E7801 Familial hypercholesterolemia: Secondary | ICD-10-CM | POA: Diagnosis not present

## 2019-04-30 DIAGNOSIS — E1122 Type 2 diabetes mellitus with diabetic chronic kidney disease: Secondary | ICD-10-CM | POA: Diagnosis not present

## 2019-04-30 DIAGNOSIS — R0600 Dyspnea, unspecified: Secondary | ICD-10-CM | POA: Diagnosis not present

## 2019-04-30 DIAGNOSIS — R0609 Other forms of dyspnea: Secondary | ICD-10-CM

## 2019-04-30 DIAGNOSIS — R69 Illness, unspecified: Secondary | ICD-10-CM | POA: Diagnosis not present

## 2019-04-30 MED ORDER — GLUCOSE BLOOD VI STRP: ORAL_STRIP | 12 refills | Status: DC

## 2019-04-30 MED ORDER — CLONIDINE HCL 0.3 MG PO TABS: ORAL_TABLET | ORAL | 1 refills | Status: DC

## 2019-04-30 MED ORDER — LEVOTHYROXINE SODIUM 150 MCG PO TABS: 150.0000 ug | ORAL_TABLET | Freq: Every day | ORAL | 1 refills | Status: DC

## 2019-04-30 MED ORDER — METOPROLOL SUCCINATE ER 100 MG PO TB24: 100.0000 mg | ORAL_TABLET | Freq: Every day | ORAL | 1 refills | Status: DC

## 2019-04-30 MED ORDER — FUROSEMIDE 40 MG PO TABS: 40.0000 mg | ORAL_TABLET | Freq: Two times a day (BID) | ORAL | 1 refills | Status: DC

## 2019-04-30 MED ORDER — GLIPIZIDE 5 MG PO TABS: 2.5000 mg | ORAL_TABLET | Freq: Two times a day (BID) | ORAL | 1 refills | Status: DC

## 2019-04-30 MED ORDER — TELMISARTAN 80 MG PO TABS: ORAL_TABLET | ORAL | 1 refills | Status: DC

## 2019-04-30 MED ORDER — ATORVASTATIN CALCIUM 10 MG PO TABS: 10.0000 mg | ORAL_TABLET | Freq: Every day | ORAL | 1 refills | Status: DC

## 2019-04-30 NOTE — Progress Notes (Signed)
Date:  04/30/2019   Name:  [name]   DOB:  1955-12-18   MRN:  [id number]   Chief Complaint: Hypothyroidism, Hypertension, Leg Swelling, and Diabetes  Hypertension This is a chronic problem. The current episode started more than 1 year ago. The problem is unchanged. Condition status: uncertain. Pertinent negatives include no anxiety, blurred vision, chest pain, headaches, malaise/fatigue, neck pain, orthopnea, palpitations, peripheral edema, PND, shortness of breath or sweats. Risk factors for coronary artery disease include diabetes mellitus and dyslipidemia. Past treatments include central alpha agonists, beta blockers, diuretics and angiotensin blockers. The current treatment provides moderate improvement. Compliance problems: difficult to contact.  There is no history of angina, kidney disease, CAD/MI, CVA, heart failure, left ventricular hypertrophy, PVD or retinopathy. Identifiable causes of hypertension include a thyroid problem. There is no history of chronic renal disease, a hypertension causing med or renovascular disease.  Diabetes Pertinent negatives for hypoglycemia include no dizziness, headaches, nervousness/anxiousness or sweats. Pertinent negatives for diabetes include no blurred vision, no chest pain, no polydipsia and no visual change. Pertinent negatives for diabetic complications include no CVA, heart disease, PVD or retinopathy. (Decrease GFR) Risk factors for coronary artery disease include hypertension, dyslipidemia and obesity. Current diabetic treatment includes oral agent (monotherapy). She is compliant with treatment some of the time. Her weight is stable. She is following a generally healthy diet. Meal planning includes avoidance of concentrated sweets and carbohydrate counting. She rarely participates in exercise. (Does not check) An ACE inhibitor/angiotensin II receptor blocker is being taken.  Thyroid Problem Presents for follow-up visit. Patient reports no  anxiety, constipation, depressed mood, diarrhea, palpitations or visual change. There is no history of heart failure.    Review of Systems  Constitutional: Negative for chills, fever and malaise/fatigue.  HENT: Negative for drooling, ear discharge, ear pain and sore throat.   Eyes: Negative for blurred vision.  Respiratory: Negative for cough, shortness of breath and wheezing.   Cardiovascular: Negative for chest pain, palpitations, orthopnea, leg swelling and PND.  Gastrointestinal: Negative for abdominal pain, blood in stool, constipation, diarrhea and nausea.  Endocrine: Negative for polydipsia.  Genitourinary: Negative for dysuria, frequency, hematuria and urgency.  Musculoskeletal: Negative for back pain, myalgias and neck pain.  Skin: Negative for rash.  Allergic/Immunologic: Negative for environmental allergies.  Neurological: Negative for dizziness and headaches.  Hematological: Does not bruise/bleed easily.  Psychiatric/Behavioral: Negative for suicidal ideas. The patient is not nervous/anxious.     Patient Active Problem List   Diagnosis Date Noted  . Morbid obesity ([place]) 10/24/2017  . Chronic kidney disease, stage IV (severe) ([place]) 10/24/2017  . Hypothyroidism 11/15/2016  . Edema 11/15/2016  . Type 2 diabetes mellitus with microalbuminuria, without long-term current use of insulin ([place]) 11/15/2016  . Essential hypertension 12/09/2015  . 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 ([place]) 06/26/2015  . Pressure ulcer 05/31/2015  . Primary osteoarthritis of right hip 05/28/2015    No Known Allergies  Past Surgical History:  Procedure Laterality Date  . TOTAL HIP ARTHROPLASTY Right 05/28/2015   Procedure: TOTAL HIP ARTHROPLASTY ANTERIOR APPROACH;  Surgeon: Hessie Knows, MD;  Location: [facility] ORS;  Service: Orthopedics;  Laterality: Right;    Social History   Tobacco Use  . Smoking status: Never Smoker  . Smokeless tobacco: Never Used   Substance Use Topics  . Alcohol use: No  . Drug use: No     Medication list has been reviewed and updated.  Current Meds  Medication  Sig  . aspirin EC 81 MG tablet Take 1 tablet (81 mg total) by mouth daily.  . cloNIDine (CATAPRES) 0.3 MG tablet TAKE ONE TABLET BY MOUTH 3 TIMES DAILY.  . furosemide (LASIX) 40 MG tablet Take 1 tablet (40 mg total) by mouth 2 (two) times daily.  Marland Kitchen glipiZIDE (GLUCOTROL) 5 MG tablet Take 0.5 tablets (2.5 mg total) by mouth 2 (two) times daily before a meal.  . glucose blood test strip Use as instructed  . levothyroxine (SYNTHROID) 150 MCG tablet Take 1 tablet (150 mcg total) by mouth daily.  . metoprolol succinate (TOPROL-XL) 50 MG 24 hr tablet Take 1 tablet (50 mg total) by mouth daily. Take with or immediately following a meal.  . telmisartan (MICARDIS) 80 MG tablet TAKE ONE TABLET BY MOUTH EVERY DAY    PHQ 2/9 Scores 05/03/2018 05/01/2017 01/19/2017 12/09/2015  PHQ - 2 Score 0 0 0 0  PHQ- 9 Score 0 3 3 -    BP Readings from Last 3 Encounters:  04/30/19 (!) 160/90  10/26/18 120/70  05/03/18 (!) 140/100    Physical Exam Vitals signs and nursing note reviewed.  Constitutional:      Appearance: She is well-developed. She is obese.  HENT:     Head: Normocephalic.     Right Ear: Tympanic membrane, ear canal and external ear normal.     Left Ear: Tympanic membrane, ear canal and external ear normal.     Nose: Nose normal. No congestion or rhinorrhea.  Eyes:     General: Lids are everted, no foreign bodies appreciated. No scleral icterus.       Left eye: No foreign body or hordeolum.     Conjunctiva/sclera: Conjunctivae normal.     Right eye: Right conjunctiva is not injected.     Left eye: Left conjunctiva is not injected.     Pupils: Pupils are equal, round, and reactive to light.  Neck:     Musculoskeletal: Normal range of motion and neck supple.     Thyroid: No thyromegaly.     Vascular: No JVD.     Trachea: No tracheal deviation.   Cardiovascular:     Rate and Rhythm: Normal rate and regular rhythm.     Pulses: Normal pulses.     Heart sounds: Normal heart sounds, S1 normal and S2 normal. No murmur. No systolic murmur. No diastolic murmur. No friction rub. No gallop. No S3 or S4 sounds.   Pulmonary:     Effort: Pulmonary effort is normal. No respiratory distress.     Breath sounds: Normal breath sounds. No wheezing, rhonchi or rales.  Abdominal:     General: Bowel sounds are normal.     Palpations: Abdomen is soft. There is no mass.     Tenderness: There is no abdominal tenderness. There is no guarding or rebound.  Musculoskeletal: Normal range of motion.        General: No tenderness.  Lymphadenopathy:     Cervical: No cervical adenopathy.  Skin:    General: Skin is warm.     Findings: No rash.  Neurological:     Mental Status: She is alert and oriented to person, place, and time.     Cranial Nerves: No cranial nerve deficit.     Deep Tendon Reflexes: Reflexes normal.  Psychiatric:        Mood and Affect: Mood is not anxious or depressed.     Wt Readings from Last 3 Encounters:  04/30/19 276 lb (125.2 kg)  10/26/18 270 lb (122.5 kg)  05/03/18 276 lb (125.2 kg)    BP (!) 160/90   Pulse 80   Ht 5\' 5"  (1.651 m)   Wt 276 lb (125.2 kg)   SpO2 98%   BMI 45.93 kg/m   Assessment and Plan:  1. Essential hypertension Chronic.  Uncontrolled.  Will continue clonidine 0.33 times a day furosemide 40 mg twice a day and telmisartan 80 mg once a day we will increase her metoprolol to 100 XL once a day.  We will recheck blood pressure in 6 to 8 weeks. - Renal Function Panel - cloNIDine (CATAPRES) 0.3 MG tablet; TAKE ONE TABLET BY MOUTH 3 TIMES DAILY.  Dispense: 270 tablet; Refill: 1 - furosemide (LASIX) 40 MG tablet; Take 1 tablet (40 mg total) by mouth 2 (two) times daily.  Dispense: 180 tablet; Refill: 1 - telmisartan (MICARDIS) 80 MG tablet; TAKE ONE TABLET BY MOUTH EVERY DAY  Dispense: 90 tablet; Refill: 1   2. Edema, unspecified type Patient has a longstanding history of edema which she is on furosemide 40 mg twice a day. - furosemide (LASIX) 40 MG tablet; Take 1 tablet (40 mg total) by mouth 2 (two) times daily.  Dispense: 180 tablet; Refill: 1  3. Hypothyroidism, unspecified type Chronic.  Controlled.  Will do thyroid panel with TSH and will continue levothyroxine 150 mcg daily. - Thyroid Panel With TSH - levothyroxine (SYNTHROID) 150 MCG tablet; Take 1 tablet (150 mcg total) by mouth daily.  Dispense: 90 tablet; Refill: 1  4. Type 2 diabetes mellitus with microalbuminuria, unspecified whether long term insulin use (HCC) Chronic.  Questionable control.  A1c's have been in the low 6 range.  Patient does not do any testing.  Patient was once again given a prescription for testing strips and glucometer. - HgB A1c - glucose blood test strip; Use as instructed  Dispense: 100 each; Refill: 12  5. CKD stage 4 due to type 2 diabetes mellitus ([place]) Patient had decreased GFR of 29 on the last and we will repeat renal panel to see where this is at this time we have discussed the likely need for referral to nephrology if this should be in the same range.  6. Morbid obesity ([place]) This may be getting better but I am not so sure patient was given a diet sheet once again to assist in weight loss and hopefully patient will take this serious.  7. Familial hypercholesterolemia Chronic.  Uncontrolled.  We will initiate atorvastatin 10 mg once a day to decrease LDL circumstances. - atorvastatin (LIPITOR) 10 MG tablet; Take 1 tablet (10 mg total) by mouth daily.  Dispense: 90 tablet; Refill: 1

## 2019-04-30 NOTE — Patient Instructions (Signed)
Carbohydrate Counting for Diabetes Mellitus, Adult  Carbohydrate counting is a method of keeping track of how many carbohydrates you eat. Eating carbohydrates naturally increases the amount of sugar (glucose) in the blood. Counting how many carbohydrates you eat helps keep your blood glucose within normal limits, which helps you manage your diabetes (diabetes mellitus). It is important to know how many carbohydrates you can safely have in each meal. This is different for every person. A diet and nutrition specialist (registered dietitian) can help you make a meal plan and calculate how many carbohydrates you should have at each meal and snack. Carbohydrates are found in the following foods:  Grains, such as breads and cereals.  Dried beans and soy products.  Starchy vegetables, such as potatoes, peas, and corn.  Fruit and fruit juices.  Milk and yogurt.  Sweets and snack foods, such as cake, cookies, candy, chips, and soft drinks. How do I count carbohydrates? There are two ways to count carbohydrates in food. You can use either of the methods or a combination of both. Reading "Nutrition Facts" on packaged food The "Nutrition Facts" list is included on the labels of almost all packaged foods and beverages in the [place] It includes:  The serving size.  Information about nutrients in each serving, including the grams (g) of carbohydrate per serving. To use the "Nutrition Facts":  Decide how many servings you will have.  Multiply the number of servings by the number of carbohydrates per serving.  The resulting number is the total amount of carbohydrates that you will be having. Learning standard serving sizes of other foods When you eat carbohydrate foods that are not packaged or do not include "Nutrition Facts" on the label, you need to measure the servings in order to count the amount of carbohydrates:  Measure the foods that you will eat with a food scale or measuring cup, if needed.   Decide how many standard-size servings you will eat.  Multiply the number of servings by 15. Most carbohydrate-rich foods have about 15 g of carbohydrates per serving. ? For example, if you eat 8 oz (170 g) of strawberries, you will have eaten 2 servings and 30 g of carbohydrates (2 servings x 15 g = 30 g).  For foods that have more than one food mixed, such as soups and casseroles, you must count the carbohydrates in each food that is included. The following list contains standard serving sizes of common carbohydrate-rich foods. Each of these servings has about 15 g of carbohydrates:   hamburger bun or  English muffin.   oz (15 mL) syrup.   oz (14 g) jelly.  1 slice of bread.  1 six-inch tortilla.  3 oz (85 g) cooked rice or pasta.  4 oz (113 g) cooked dried beans.  4 oz (113 g) starchy vegetable, such as peas, corn, or potatoes.  4 oz (113 g) hot cereal.  4 oz (113 g) mashed potatoes or  of a large baked potato.  4 oz (113 g) canned or frozen fruit.  4 oz (120 mL) fruit juice.  4-6 crackers.  6 chicken nuggets.  6 oz (170 g) unsweetened dry cereal.  6 oz (170 g) plain fat-free yogurt or yogurt sweetened with artificial sweeteners.  8 oz (240 mL) milk.  8 oz (170 g) fresh fruit or one small piece of fruit.  24 oz (680 g) popped popcorn. Example of carbohydrate counting Sample meal  3 oz (85 g) chicken breast.  6 oz (170 g)   brown rice.  4 oz (113 g) corn.  8 oz (240 mL) milk.  8 oz (170 g) strawberries with sugar-free whipped topping. Carbohydrate calculation 1. Identify the foods that contain carbohydrates: ? Rice. ? Corn. ? Milk. ? Strawberries. 2. Calculate how many servings you have of each food: ? 2 servings rice. ? 1 serving corn. ? 1 serving milk. ? 1 serving strawberries. 3. Multiply each number of servings by 15 g: ? 2 servings rice x 15 g = 30 g. ? 1 serving corn x 15 g = 15 g. ? 1 serving milk x 15 g = 15 g. ? 1 serving  strawberries x 15 g = 15 g. 4. Add together all of the amounts to find the total grams of carbohydrates eaten: ? 30 g + 15 g + 15 g + 15 g = 75 g of carbohydrates total. Summary  Carbohydrate counting is a method of keeping track of how many carbohydrates you eat.  Eating carbohydrates naturally increases the amount of sugar (glucose) in the blood.  Counting how many carbohydrates you eat helps keep your blood glucose within normal limits, which helps you manage your diabetes.  A diet and nutrition specialist (registered dietitian) can help you make a meal plan and calculate how many carbohydrates you should have at each meal and snack. This information is not intended to replace advice given to you by your health care provider. Make sure you discuss any questions you have with your health care provider. Document Released: 06/13/2005 Document Revised: 01/05/2017 Document Reviewed: 11/25/2015 Elsevier Patient Education  2020 Elsevier Inc.  

## 2019-05-01 LAB — THYROID PANEL WITH TSH
Free Thyroxine Index: 4 (ref 1.2–4.9)
T3 Uptake Ratio: 34 % (ref 24–39)
T4, Total: 11.9 ug/dL (ref 4.5–12.0)
TSH: 0.034 u[IU]/mL — ABNORMAL LOW (ref 0.450–4.500)

## 2019-05-01 LAB — RENAL FUNCTION PANEL
Albumin: 4 g/dL (ref 3.8–4.8)
BUN/Creatinine Ratio: 15 (ref 12–28)
BUN: 21 mg/dL (ref 8–27)
CO2: 19 mmol/L — ABNORMAL LOW (ref 20–29)
Calcium: 9.6 mg/dL (ref 8.7–10.3)
Chloride: 101 mmol/L (ref 96–106)
Creatinine, Ser: 1.43 mg/dL — ABNORMAL HIGH (ref 0.57–1.00)
GFR calc Af Amer: 44 mL/min/{1.73_m2} — ABNORMAL LOW (ref >59)
GFR calc non Af Amer: 38 mL/min/{1.73_m2} — ABNORMAL LOW (ref >59)
Glucose: 128 mg/dL — ABNORMAL HIGH (ref 65–99)
Phosphorus: 2.6 mg/dL — ABNORMAL LOW (ref 3.0–4.3)
Potassium: 4.4 mmol/L (ref 3.5–5.2)
Sodium: 137 mmol/L (ref 134–144)

## 2019-05-01 LAB — HEMOGLOBIN A1C
Est. average glucose Bld gHb Est-mCnc: 148 mg/dL
Hgb A1c MFr Bld: 6.8 % — ABNORMAL HIGH (ref 4.8–5.6)

## 2019-05-01 MED ORDER — METOPROLOL SUCCINATE ER 50 MG PO TB24: 50.0000 mg | ORAL_TABLET | Freq: Every day | ORAL | 1 refills | Status: DC

## 2019-05-01 MED ORDER — LEVOTHYROXINE SODIUM 137 MCG PO TABS: 137.0000 ug | ORAL_TABLET | Freq: Every day | ORAL | 1 refills | Status: DC

## 2019-05-01 NOTE — Addendum Note (Signed)
Addended by: Fredderick Severance on: 05/01/2019 07:50 AM   Modules accepted: Orders

## 2019-05-02 ENCOUNTER — Telehealth: Payer: Self-pay

## 2019-05-02 NOTE — Telephone Encounter (Signed)
Spoke to pt- gave her appt for Dr Clayborn Bigness on 05/30/2019 @ 2:15 in [facility]- told her to be there at 2:00

## 2019-05-04 DIAGNOSIS — R69 Illness, unspecified: Secondary | ICD-10-CM | POA: Diagnosis not present

## 2019-05-30 DIAGNOSIS — I890 Lymphedema, not elsewhere classified: Secondary | ICD-10-CM | POA: Diagnosis not present

## 2019-05-30 DIAGNOSIS — Z6841 Body Mass Index (BMI) 40.0 and over, adult: Secondary | ICD-10-CM | POA: Diagnosis not present

## 2019-05-30 DIAGNOSIS — I10 Essential (primary) hypertension: Secondary | ICD-10-CM | POA: Diagnosis not present

## 2019-05-30 DIAGNOSIS — E119 Type 2 diabetes mellitus without complications: Secondary | ICD-10-CM | POA: Diagnosis not present

## 2019-05-30 DIAGNOSIS — R0602 Shortness of breath: Secondary | ICD-10-CM | POA: Diagnosis not present

## 2019-05-30 DIAGNOSIS — I509 Heart failure, unspecified: Secondary | ICD-10-CM | POA: Diagnosis not present

## 2019-07-08 ENCOUNTER — Other Ambulatory Visit: Payer: Medicare HMO

## 2019-07-08 ENCOUNTER — Other Ambulatory Visit: Payer: Self-pay

## 2019-07-08 DIAGNOSIS — E1129 Type 2 diabetes mellitus with other diabetic kidney complication: Secondary | ICD-10-CM

## 2019-07-08 DIAGNOSIS — E039 Hypothyroidism, unspecified: Secondary | ICD-10-CM

## 2019-07-08 NOTE — Progress Notes (Signed)
Put in labs

## 2019-07-09 DIAGNOSIS — R809 Proteinuria, unspecified: Secondary | ICD-10-CM | POA: Diagnosis not present

## 2019-07-09 DIAGNOSIS — E039 Hypothyroidism, unspecified: Secondary | ICD-10-CM | POA: Diagnosis not present

## 2019-07-09 DIAGNOSIS — E1129 Type 2 diabetes mellitus with other diabetic kidney complication: Secondary | ICD-10-CM | POA: Diagnosis not present

## 2019-07-10 ENCOUNTER — Other Ambulatory Visit: Payer: Self-pay

## 2019-07-10 DIAGNOSIS — R809 Proteinuria, unspecified: Secondary | ICD-10-CM

## 2019-07-10 DIAGNOSIS — E1129 Type 2 diabetes mellitus with other diabetic kidney complication: Secondary | ICD-10-CM

## 2019-07-10 DIAGNOSIS — E039 Hypothyroidism, unspecified: Secondary | ICD-10-CM

## 2019-07-10 LAB — HEMOGLOBIN A1C
Est. average glucose Bld gHb Est-mCnc: 163 mg/dL
Hgb A1c MFr Bld: 7.3 % — ABNORMAL HIGH (ref 4.8–5.6)

## 2019-07-10 LAB — TSH: TSH: 0.095 u[IU]/mL — ABNORMAL LOW (ref 0.450–4.500)

## 2019-07-10 MED ORDER — GLIPIZIDE 5 MG PO TABS: ORAL_TABLET | ORAL | 0 refills | Status: DC

## 2019-07-10 MED ORDER — LEVOTHYROXINE SODIUM 125 MCG PO TABS: 125.0000 ug | ORAL_TABLET | Freq: Every day | ORAL | 0 refills | Status: DC

## 2019-07-10 NOTE — Progress Notes (Signed)
Put in new levo and glipizide doses and sent to pharm [address]

## 2019-09-30 ENCOUNTER — Other Ambulatory Visit: Payer: Self-pay

## 2019-09-30 DIAGNOSIS — E039 Hypothyroidism, unspecified: Secondary | ICD-10-CM

## 2019-09-30 MED ORDER — LEVOTHYROXINE SODIUM 125 MCG PO TABS: 125.0000 ug | ORAL_TABLET | Freq: Every day | ORAL | 0 refills | Status: DC

## 2019-11-04 ENCOUNTER — Ambulatory Visit (INDEPENDENT_AMBULATORY_CARE_PROVIDER_SITE_OTHER): Payer: Medicare HMO

## 2019-11-04 DIAGNOSIS — Z0000 Encounter for general adult medical examination without abnormal findings: Secondary | ICD-10-CM

## 2019-11-04 NOTE — Progress Notes (Signed)
Subjective:   [name] is a 64 y.o. female who presents for an Initial Medicare Annual Wellness Visit.  Virtual Visit via Telephone Note  I connected with  [name] on 11/04/19 at 10:40 AM EDT by telephone and verified that I am speaking with the correct person using two identifiers.  Medicare Annual Wellness visit completed telephonically due to Covid-19 pandemic.   Location: Patient: home Provider: office   I discussed the limitations, risks, security and privacy concerns of performing an evaluation and management service by telephone and the availability of in person appointments. The patient expressed understanding and agreed to proceed.  Unable to perform video visit due to patient does not have video capability.   Some vital signs may be absent or patient reported.   Clemetine Marker, LPN    Review of Systems    Cardiac Risk Factors include: advanced age (>76men, >20 women);diabetes mellitus;hypertension;dyslipidemia;sedentary lifestyle     Objective:    There were no vitals filed for this visit. There is no height or weight on file to calculate BMI.  Advanced Directives 11/04/2019 05/28/2015 05/07/2015 03/26/2015 02/25/2015 12/22/2014 12/15/2014  Does Patient Have a Medical Advance Directive? No No No No No No No  Would patient like information on creating a medical advance directive? No - Patient declined No - patient declined information Yes - Scientist, clinical (histocompatibility and immunogenetics) given Yes - Scientist, clinical (histocompatibility and immunogenetics) given No - patient declined information No - patient declined information No - patient declined information    Current Medications (verified) Outpatient Encounter Medications as of 11/04/2019  Medication Sig  . aspirin EC 81 MG tablet Take 1 tablet (81 mg total) by mouth daily.  Marland Kitchen atorvastatin (LIPITOR) 10 MG tablet Take 1 tablet (10 mg total) by mouth daily.  . cloNIDine (CATAPRES) 0.3 MG tablet TAKE ONE TABLET BY MOUTH 3 TIMES DAILY.  . furosemide (LASIX) 40  MG tablet Take 1 tablet (40 mg total) by mouth 2 (two) times daily.  Marland Kitchen glipiZIDE (GLUCOTROL) 5 MG tablet Take 1 tablet (5mg ) in the am and 1/2 tablet (2.5mg ) in the pm  . glucose blood test strip Use as instructed  . levothyroxine (SYNTHROID) 125 MCG tablet Take 1 tablet (125 mcg total) by mouth daily before breakfast.  . metoprolol succinate (TOPROL-XL) 50 MG 24 hr tablet Take 1 tablet (50 mg total) by mouth daily.  Marland Kitchen telmisartan (MICARDIS) 80 MG tablet TAKE ONE TABLET BY MOUTH EVERY DAY   No facility-administered encounter medications on file as of 11/04/2019.    Allergies (verified) Patient has no known allergies.   History: Past Medical History:  Diagnosis Date  . Diabetes mellitus without complication ([place]) 6286  . Hypertension   . Hypothyroidism   . Kidney stone 11/2014   history of/STAGE 4 KIDNEY DISEASE PER DR Juleen China  . Lymphedema   . Multiple open wounds of lower leg    PT BEING SEEN BY THE [facility] FOR CHRONIC BLISTERS PER PT   Past Surgical History:  Procedure Laterality Date  . 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 Cancer Mother   . Diabetes Father    Social History   Socioeconomic History  . Marital status: Single    Spouse name: Not on file  . Number of children: 0  . Years of education: Not on  file  . Highest education level: Not on file  Occupational History  . Not on file  Tobacco Use  . Smoking status: Never Smoker  . Smokeless tobacco: Never Used  Substance and Sexual Activity  . Alcohol use: No  . Drug use: No  . Sexual activity: Never  Other Topics Concern  . Not on file  Social History Narrative   Pt lives alone   Social Determinants of Health   Financial Resource Strain: Low Risk   . Difficulty of Paying Living Expenses: Not hard at all   Food Insecurity: No Food Insecurity  . Worried About Charity fundraiser in the Last Year: Never true  . Ran Out of Food in the Last Year: Never true  Transportation Needs: No Transportation Needs  . Lack of Transportation (Medical): No  . Lack of Transportation (Non-Medical): No  Physical Activity: Inactive  . Days of Exercise per Week: 0 days  . Minutes of Exercise per Session: 0 min  Stress: No Stress Concern Present  . Feeling of Stress : Not at all  Social Connections: Unknown  . Frequency of Communication with Friends and Family: Patient refused  . Frequency of Social Gatherings with Friends and Family: Patient refused  . Attends Religious Services: Patient refused  . Active Member of Clubs or Organizations: Patient refused  . Attends Archivist Meetings: Patient refused  . Marital Status: Patient refused    Tobacco Counseling Counseling given: Not Answered   Clinical Intake:  Pre-visit preparation completed: Yes  Pain : No/denies pain     Nutritional Risks: None Diabetes: Yes CBG done?: No Did pt. bring in CBG monitor from home?: No   Nutrition Risk Assessment:  Has the patient had any N/V/D within the last 2 months?  No  Does the patient have any non-healing wounds?  No  Has the patient had any unintentional weight loss or weight gain?  No   Diabetes:  Is the patient diabetic?  Yes  If diabetic, was a CBG obtained today?  No  Did the patient bring in their glucometer from home?  No  How often do you monitor your CBG's? daily.   Financial Strains and Diabetes Management:  Are you having any financial strains with the device, your supplies or your medication? No .  Does the patient want to be seen by Chronic Care Management for management of their diabetes?  No  Would the patient like to be referred to a Nutritionist or for Diabetic Management?  No   Diabetic Exams:  Diabetic Eye Exam: Completed 2016. Overdue for diabetic eye exam. Pt has  been advised about the importance in completing this exam.   Diabetic Foot Exam: Completed 04/30/19.   How often do you need to have someone help you when you read instructions, pamphlets, or other written materials from your doctor or pharmacy?: 1 - Never  Interpreter Needed?: No  Information entered by :: Clemetine Marker LPN   Activities of Daily Living In your present state of health, do you have any difficulty performing the following activities: 11/04/2019  Hearing? N  Comment declines hearing aids  Vision? N  Difficulty concentrating or making decisions? N  Walking or climbing stairs? N  Dressing or bathing? N  Doing errands, shopping? N  Preparing Food and eating ? N  Using the Toilet? N  In the past six months, have you accidently leaked urine? N  Do you have problems with loss of bowel control? N  Managing  your Medications? N  Managing your Finances? N  Housekeeping or managing your Housekeeping? N  Some recent data might be hidden     Immunizations and Health Maintenance Immunization History  Administered Date(s) Administered  . Tdap 05/03/2018   Health Maintenance Due  Topic Date Due  . Hepatitis C Screening  Never done  . OPHTHALMOLOGY EXAM  Never done  . MAMMOGRAM  Never done  . COLONOSCOPY  Never done  . DEXA SCAN  Never done  . PNA vac Low Risk Adult (1 of 2 - PCV13) Never done    Patient Care Team: Juline Patch, MD as PCP - General (Family Medicine) Ubaldo Glassing Javier Docker, MD as Consulting Physician (Cardiology)  Indicate any recent Medical Services you may have received from other than [facility] providers in the past year (date may be approximate).     Assessment:   This is a routine wellness examination for [name].  Hearing/Vision screen  Hearing Screening   125Hz  250Hz  500Hz  1000Hz  2000Hz  3000Hz  4000Hz  6000Hz  8000Hz   Right ear:           Left ear:           Comments: Pt denies hearing difficulty  Vision Screening Comments: Past due for eye exam;  plans to establish care with [facility]  Dietary issues and exercise activities discussed: Current Exercise Habits: The patient does not participate in regular exercise at present, Exercise limited by: orthopedic condition(s)  Goals   None    Depression Screen PHQ 2/9 Scores 11/04/2019 05/03/2018 05/01/2017 01/19/2017 12/09/2015 12/22/2014  PHQ - 2 Score 0 0 0 0 0 0  PHQ- 9 Score - 0 3 3 - -    Fall Risk Fall Risk  11/04/2019 01/19/2017 12/09/2015 12/22/2014  Falls in the past year? 0 No Yes No  Number falls in past yr: 0 - 2 or more -  Injury with Fall? 0 - No -  Risk for fall due to : Impaired balance/gait;Impaired mobility - - -  Follow up Falls prevention discussed - - -    FALL RISK PREVENTION PERTAINING TO THE HOME:  Any stairs in or around the home? Yes  If so, do they handrails? Yes   Home free of loose throw rugs in walkways, pet beds, electrical cords, etc? Yes  Adequate lighting in your home to reduce risk of falls? Yes   ASSISTIVE DEVICES UTILIZED TO PREVENT FALLS:  Life alert? No  Use of a cane, walker or w/c? Yes  Grab bars in the bathroom? Yes  Shower chair or bench in shower? No  Elevated toilet seat or a handicapped toilet? No   DME ORDERS:  DME order needed?  No   TIMED UP AND GO:  Was the test performed? No . Telephonic visit.    Education: Fall risk prevention has been discussed.  Intervention(s) required? No   Cognitive Function:     6CIT Screen 11/04/2019  What Year? 0 points  What month? 0 points  What time? 0 points  Count back from 20 0 points  Months in reverse 0 points  Repeat phrase 0 points  Total Score 0    Screening Tests Health Maintenance  Topic Date Due  . Hepatitis C Screening  Never done  . OPHTHALMOLOGY EXAM  Never done  . MAMMOGRAM  Never done  . COLONOSCOPY  Never done  . DEXA SCAN  Never done  . PNA vac Low Risk Adult (1 of 2 - PCV13) Never done  .  COVID-19 Vaccine (1) 06/27/2020 (Originally 08/27/1971)  .  HEMOGLOBIN A1C  01/06/2020  . FOOT EXAM  04/29/2020  . TETANUS/TDAP  05/03/2028  . INFLUENZA VACCINE  Discontinued    Qualifies for Shingles Vaccine? Yes . Due for Shingrix. Education has been provided regarding the importance of this vaccine. Pt has been advised to call insurance company to determine out of pocket expense. Advised may also receive vaccine at local pharmacy or Health Dept. Verbalized acceptance and understanding.  Tdap: Up to date  Flu Vaccine: Due for Flu vaccine. Does the patient want to receive this vaccine today?  No . Education has been provided regarding the importance of this vaccine but still declined. Advised may receive this vaccine at local pharmacy or Health Dept. Aware to provide a copy of the vaccination record if obtained from local pharmacy or Health Dept. Verbalized acceptance and understanding.  Pneumococcal Vaccine: Due for Pneumococcal vaccine. Does the patient want to receive this vaccine today?  No . Education has been provided regarding the importance of this vaccine but still declined. Advised may receive this vaccine at local pharmacy or Health Dept. Aware to provide a copy of the vaccination record if obtained from local pharmacy or Health Dept. Verbalized acceptance and understanding.  Covid-19 Vaccine: Due for Covid-19 vaccine. Education has been provided regarding the importance of this vaccine. Advised may receive this vaccine at local pharmacy, health department or [place] vaccine clinic. Aware to provide a copy of the vaccination record if obtained from local pharmacy or health department. Verbalized acceptance and understanding.    Cancer Screenings:  Colorectal Screening: pt declines  Mammogram: Completed 2012. Repeat every year; pt declines repeat screening at this time.   Bone Density: DUE. Pt declines.   Lung Cancer Screening: (Low Dose CT Chest recommended if Age 24-80 years, 30 pack-year currently smoking OR have quit w/in 15years.)  does not qualify.    Additional Screening:  Hepatitis C Screening: does qualify; postponed  Vision Screening: Recommended annual ophthalmology exams for early detection of glaucoma and other disorders of the eye. Is the patient up to date with their annual eye exam?  No  Who is the provider or what is the name of the office in which the pt attends annual eye exams? Not established If pt is not established with a provider, would they like to be referred to a provider to establish care? No . Pt plans to establish care with [facility].   Dental Screening: Recommended annual dental exams for proper oral hygiene  Community Resource Referral:  CRR required this visit?  No      Plan:    I have personally reviewed and addressed the Medicare Annual Wellness questionnaire and have noted the following in the patient's chart:  A. Medical and social history B. Use of alcohol, tobacco or illicit drugs  C. Current medications and supplements D. Functional ability and status E.  Nutritional status F.  Physical activity G. Advance directives H. List of other physicians I.  Hospitalizations, surgeries, and ER visits in previous 12 months J.  [place] such as hearing and vision if needed, cognitive and depression L. Referrals and appointments   In addition, I have reviewed and discussed with patient certain preventive protocols, quality metrics, and best practice recommendations. A written personalized care plan for preventive services as well as general preventive health recommendations were provided to patient.   Signed,  Clemetine Marker, LPN Nurse Health Advisor   Nurse Notes: pt reminded  about appt tomorrow with Dr. Ronnald Ramp.

## 2019-11-04 NOTE — Patient Instructions (Signed)
[name] , Thank you for taking time to come for your Medicare Wellness Visit. I appreciate your ongoing commitment to your health goals. Please review the following plan we discussed and let me know if I can assist you in the future.   Screening recommendations/referrals: Colonoscopy: due Mammogram: due Bone Density: due Recommended yearly ophthalmology/optometry visit for glaucoma screening and checkup Recommended yearly dental visit for hygiene and checkup  Vaccinations: Influenza vaccine: postponed Pneumococcal vaccine: postponed Tdap vaccine: done 05/03/18 Shingles vaccine: Shingrix discussed. Please contact your pharmacy for coverage information.  Covid-19: postponed  Advanced directives: Advance directive discussed with you today. Even though you declined this today please call our office should you change your mind and we can give you the proper paperwork for you to fill out.  Conditions/risks identified: Recommend increasing physical activity as tolerated  Next appointment: Please follow up in one year for your Medicare Annual Wellness visit.     Preventive Care 24 Years and Older, Female Preventive care refers to lifestyle choices and visits with your health care provider that can promote health and wellness. What does preventive care include?  A yearly physical exam. This is also called an annual well check.  Dental exams once or twice a year.  Routine eye exams. Ask your health care provider how often you should have your eyes checked.  Personal lifestyle choices, including:  Daily care of your teeth and gums.  Regular physical activity.  Eating a healthy diet.  Avoiding tobacco and drug use.  Limiting alcohol use.  Practicing safe sex.  Taking low-dose aspirin every day.  Taking vitamin and mineral supplements as recommended by your health care provider. What happens during an annual well check? The services and screenings done by your health care  provider during your annual well check will depend on your age, overall health, lifestyle risk factors, and family history of disease. Counseling  Your health care provider may ask you questions about your:  Alcohol use.  Tobacco use.  Drug use.  Emotional well-being.  Home and relationship well-being.  Sexual activity.  Eating habits.  History of falls.  Memory and ability to understand (cognition).  Work and work Statistician.  Reproductive health. Screening  You may have the following tests or measurements:  Height, weight, and BMI.  Blood pressure.  Lipid and cholesterol levels. These may be checked every 5 years, or more frequently if you are over 66 years old.  Skin check.  Lung cancer screening. You may have this screening every year starting at age 54 if you have a 30-pack-year history of smoking and currently smoke or have quit within the past 15 years.  Fecal occult blood test (FOBT) of the stool. You may have this test every year starting at age 55.  Flexible sigmoidoscopy or colonoscopy. You may have a sigmoidoscopy every 5 years or a colonoscopy every 10 years starting at age 58.  Hepatitis C blood test.  Hepatitis B blood test.  Sexually transmitted disease (STD) testing.  Diabetes screening. This is done by checking your blood sugar (glucose) after you have not eaten for a while (fasting). You may have this done every 1-3 years.  Bone density scan. This is done to screen for osteoporosis. You may have this done starting at age 56.  Mammogram. This may be done every 1-2 years. Talk to your health care provider about how often you should have regular mammograms. Talk with your health care provider about your test results, treatment options, and if  necessary, the need for more tests. Vaccines  Your health care provider may recommend certain vaccines, such as:  Influenza vaccine. This is recommended every year.  Tetanus, diphtheria, and acellular  pertussis (Tdap, Td) vaccine. You may need a Td booster every 10 years.  Zoster vaccine. You may need this after age 47.  Pneumococcal 13-valent conjugate (PCV13) vaccine. One dose is recommended after age 57.  Pneumococcal polysaccharide (PPSV23) vaccine. One dose is recommended after age 72. Talk to your health care provider about which screenings and vaccines you need and how often you need them. This information is not intended to replace advice given to you by your health care provider. Make sure you discuss any questions you have with your health care provider. Document Released: 07/10/2015 Document Revised: 03/02/2016 Document Reviewed: 04/14/2015 Elsevier Interactive Patient Education  2017 [place] Prevention in the Home Falls can cause injuries. They can happen to people of all ages. There are many things you can do to make your home safe and to help prevent falls. What can I do on the outside of my home?  Regularly fix the edges of walkways and driveways and fix any cracks.  Remove anything that might make you trip as you walk through a door, such as a raised step or threshold.  Trim any bushes or trees on the path to your home.  Use bright outdoor lighting.  Clear any walking paths of anything that might make someone trip, such as rocks or tools.  Regularly check to see if handrails are loose or broken. Make sure that both sides of any steps have handrails.  Any raised decks and porches should have guardrails on the edges.  Have any leaves, snow, or ice cleared regularly.  Use sand or salt on walking paths during winter.  Clean up any spills in your garage right away. This includes oil or grease spills. What can I do in the bathroom?  Use night lights.  Install grab bars by the toilet and in the tub and shower. Do not use towel bars as grab bars.  Use non-skid mats or decals in the tub or shower.  If you need to sit down in the shower, use a plastic,  non-slip stool.  Keep the floor dry. Clean up any water that spills on the floor as soon as it happens.  Remove soap buildup in the tub or shower regularly.  Attach bath mats securely with double-sided non-slip rug tape.  Do not have throw rugs and other things on the floor that can make you trip. What can I do in the bedroom?  Use night lights.  Make sure that you have a light by your bed that is easy to reach.  Do not use any sheets or blankets that are too big for your bed. They should not hang down onto the floor.  Have a firm chair that has side arms. You can use this for support while you get dressed.  Do not have throw rugs and other things on the floor that can make you trip. What can I do in the kitchen?  Clean up any spills right away.  Avoid walking on wet floors.  Keep items that you use a lot in easy-to-reach places.  If you need to reach something above you, use a strong step stool that has a grab bar.  Keep electrical cords out of the way.  Do not use floor polish or wax that makes floors slippery. If you must  use wax, use non-skid floor wax.  Do not have throw rugs and other things on the floor that can make you trip. What can I do with my stairs?  Do not leave any items on the stairs.  Make sure that there are handrails on both sides of the stairs and use them. Fix handrails that are broken or loose. Make sure that handrails are as long as the stairways.  Check any carpeting to make sure that it is firmly attached to the stairs. Fix any carpet that is loose or worn.  Avoid having throw rugs at the top or bottom of the stairs. If you do have throw rugs, attach them to the floor with carpet tape.  Make sure that you have a light switch at the top of the stairs and the bottom of the stairs. If you do not have them, ask someone to add them for you. What else can I do to help prevent falls?  Wear shoes that:  Do not have high heels.  Have rubber  bottoms.  Are comfortable and fit you well.  Are closed at the toe. Do not wear sandals.  If you use a stepladder:  Make sure that it is fully opened. Do not climb a closed stepladder.  Make sure that both sides of the stepladder are locked into place.  Ask someone to hold it for you, if possible.  Clearly mark and make sure that you can see:  Any grab bars or handrails.  First and last steps.  Where the edge of each step is.  Use tools that help you move around (mobility aids) if they are needed. These include:  Canes.  Walkers.  Scooters.  Crutches.  Turn on the lights when you go into a dark area. Replace any light bulbs as soon as they burn out.  Set up your furniture so you have a clear path. Avoid moving your furniture around.  If any of your floors are uneven, fix them.  If there are any pets around you, be aware of where they are.  Review your medicines with your doctor. Some medicines can make you feel dizzy. This can increase your chance of falling. Ask your doctor what other things that you can do to help prevent falls. This information is not intended to replace advice given to you by your health care provider. Make sure you discuss any questions you have with your health care provider. Document Released: 04/09/2009 Document Revised: 11/19/2015 Document Reviewed: 07/18/2014 Elsevier Interactive Patient Education  2017 [employer].

## 2019-11-05 ENCOUNTER — Other Ambulatory Visit: Payer: Self-pay

## 2019-11-05 ENCOUNTER — Ambulatory Visit (INDEPENDENT_AMBULATORY_CARE_PROVIDER_SITE_OTHER): Payer: Medicare HMO | Admitting: Family Medicine

## 2019-11-05 ENCOUNTER — Encounter: Payer: Self-pay | Admitting: Family Medicine

## 2019-11-05 VITALS — BP 130/84 | HR 88 | Ht 65.0 in | Wt 271.0 lb

## 2019-11-05 DIAGNOSIS — R809 Proteinuria, unspecified: Secondary | ICD-10-CM | POA: Diagnosis not present

## 2019-11-05 DIAGNOSIS — E1129 Type 2 diabetes mellitus with other diabetic kidney complication: Secondary | ICD-10-CM

## 2019-11-05 DIAGNOSIS — E7801 Familial hypercholesterolemia: Secondary | ICD-10-CM

## 2019-11-05 DIAGNOSIS — R609 Edema, unspecified: Secondary | ICD-10-CM | POA: Diagnosis not present

## 2019-11-05 DIAGNOSIS — E039 Hypothyroidism, unspecified: Secondary | ICD-10-CM | POA: Diagnosis not present

## 2019-11-05 DIAGNOSIS — I10 Essential (primary) hypertension: Secondary | ICD-10-CM | POA: Diagnosis not present

## 2019-11-05 MED ORDER — GLIPIZIDE 5 MG PO TABS: ORAL_TABLET | ORAL | 1 refills | Status: DC

## 2019-11-05 MED ORDER — LEVOTHYROXINE SODIUM 125 MCG PO TABS: 125.0000 ug | ORAL_TABLET | Freq: Every day | ORAL | 1 refills | Status: DC

## 2019-11-05 MED ORDER — ATORVASTATIN CALCIUM 10 MG PO TABS: 10.0000 mg | ORAL_TABLET | Freq: Every day | ORAL | 1 refills | Status: DC

## 2019-11-05 MED ORDER — CLONIDINE HCL 0.3 MG PO TABS: ORAL_TABLET | ORAL | 1 refills | Status: DC

## 2019-11-05 MED ORDER — METOPROLOL SUCCINATE ER 50 MG PO TB24: 50.0000 mg | ORAL_TABLET | Freq: Every day | ORAL | 1 refills | Status: DC

## 2019-11-05 MED ORDER — TELMISARTAN 80 MG PO TABS: ORAL_TABLET | ORAL | 1 refills | Status: DC

## 2019-11-05 MED ORDER — FUROSEMIDE 40 MG PO TABS: 40.0000 mg | ORAL_TABLET | Freq: Two times a day (BID) | ORAL | 1 refills | Status: DC

## 2019-11-05 NOTE — Progress Notes (Signed)
Date:  11/05/2019   Name:  [name]   DOB:  05-11-1956   MRN:  [id number]   Chief Complaint: Hyperlipidemia, Diabetes, Hypertension, Hypothyroidism, and Edema  Hyperlipidemia This is a chronic problem. The current episode started more than 1 year ago. The problem is controlled. Recent lipid tests were reviewed and are normal. Exacerbating diseases include diabetes, hypothyroidism and obesity. She has no history of chronic renal disease, liver disease or nephrotic syndrome. There are no known factors aggravating her hyperlipidemia. Pertinent negatives include no chest pain, focal sensory loss, focal weakness, leg pain, myalgias or shortness of breath. Current antihyperlipidemic treatment includes statins. The current treatment provides moderate improvement of lipids. There are no compliance problems.  Risk factors for coronary artery disease include dyslipidemia and hypertension.  Diabetes She presents for her follow-up diabetic visit. She has type 2 diabetes mellitus. Her disease course has been stable. There are no hypoglycemic associated symptoms. Pertinent negatives for hypoglycemia include no confusion, dizziness, headaches, hunger, mood changes, nervousness/anxiousness, pallor, seizures, sleepiness, speech difficulty, sweats or tremors. There are no diabetic associated symptoms. Pertinent negatives for diabetes include no blurred vision, no chest pain, no fatigue, no foot paresthesias, no foot ulcerations, no polydipsia, no polyphagia, no polyuria, no visual change, no weakness and no weight loss. There are no hypoglycemic complications. Symptoms are stable. There are no diabetic complications. Pertinent negatives for diabetic complications include no CVA, PVD or retinopathy. Risk factors for coronary artery disease include dyslipidemia, diabetes mellitus and hypertension. Current diabetic treatment includes oral agent (monotherapy). She is compliant with treatment most of the time. Her  weight is decreasing steadily. She is following a generally healthy diet. Her breakfast blood glucose is taken between 8-9 am. Her breakfast blood glucose range is generally 130-140 mg/dl. An ACE inhibitor/angiotensin II receptor blocker is being taken. Eye exam is current.  Hypertension This is a chronic problem. The current episode started more than 1 year ago. The problem has been gradually improving since onset. The problem is controlled. Pertinent negatives include no anxiety, blurred vision, chest pain, headaches, malaise/fatigue, neck pain, orthopnea, palpitations, peripheral edema, PND, shortness of breath or sweats. There are no associated agents to hypertension. Risk factors for coronary artery disease include diabetes mellitus, dyslipidemia and obesity. Past treatments include central alpha agonists, angiotensin blockers, diuretics and beta blockers. There are no compliance problems.  There is no history of angina, kidney disease, CAD/MI, CVA, heart failure, left ventricular hypertrophy, PVD or retinopathy. Identifiable causes of hypertension include a thyroid problem. There is no history of chronic renal disease.  Thyroid Problem Presents for follow-up visit. Patient reports no anxiety, cold intolerance, constipation, depressed mood, diaphoresis, diarrhea, fatigue, hair loss, heat intolerance, hoarse voice, leg swelling, menstrual problem, nail problem, palpitations, tremors, visual change, weight gain or weight loss. The symptoms have been stable. Her past medical history is significant for diabetes and hyperlipidemia. There is no history of heart failure.    Lab Results  Component Value Date   CREATININE 1.43 (H) 04/30/2019   BUN 21 04/30/2019   NA 137 04/30/2019   K 4.4 04/30/2019   CL 101 04/30/2019   CO2 19 (L) 04/30/2019   Lab Results  Component Value Date   CHOL 200 (H) 10/26/2018   HDL 42 10/26/2018   LDLCALC 142 (H) 10/26/2018   TRIG 79 10/26/2018   CHOLHDL 4.4 05/01/2017     Lab Results  Component Value Date   TSH 0.095 (L) 07/09/2019   Lab Results  Component Value Date   HGBA1C 7.3 (H) 07/09/2019   Lab Results  Component Value Date   WBC 8.5 05/31/2015   HGB 8.4 (L) 05/31/2015   HCT 25.8 (L) 05/31/2015   MCV 88.5 05/31/2015   PLT 186 05/31/2015   Lab Results  Component Value Date   ALT 9 (L) 05/07/2015   AST 20 05/07/2015   ALKPHOS 72 05/07/2015   BILITOT 1.0 05/07/2015     Review of Systems  Constitutional: Negative.  Negative for chills, diaphoresis, fatigue, fever, malaise/fatigue, unexpected weight change, weight gain and weight loss.  HENT: Negative for congestion, ear discharge, ear pain, hoarse voice, rhinorrhea, sinus pressure, sneezing and sore throat.   Eyes: Negative for blurred vision, photophobia, pain, discharge, redness and itching.  Respiratory: Negative for cough, shortness of breath, wheezing and stridor.   Cardiovascular: Negative for chest pain, palpitations, orthopnea and PND.  Gastrointestinal: Negative for abdominal pain, blood in stool, constipation, diarrhea, nausea and vomiting.  Endocrine: Negative for cold intolerance, heat intolerance, polydipsia, polyphagia and polyuria.  Genitourinary: Negative for dysuria, flank pain, frequency, hematuria, menstrual problem, pelvic pain, urgency, vaginal bleeding and vaginal discharge.  Musculoskeletal: Negative for arthralgias, back pain, myalgias and neck pain.  Skin: Negative for pallor and rash.  Allergic/Immunologic: Negative for environmental allergies and food allergies.  Neurological: Negative for dizziness, tremors, focal weakness, seizures, speech difficulty, weakness, light-headedness, numbness and headaches.  Hematological: Negative for adenopathy. Does not bruise/bleed easily.  Psychiatric/Behavioral: Negative for confusion and dysphoric mood. The patient is not nervous/anxious.     Patient Active Problem List   Diagnosis Date Noted  . Morbid obesity ([place])  10/24/2017  . Chronic kidney disease, stage IV (severe) ([place]) 10/24/2017  . Hypothyroidism 11/15/2016  . Edema 11/15/2016  . Type 2 diabetes mellitus with microalbuminuria, without long-term current use of insulin ([place]) 11/15/2016  . Essential hypertension 12/09/2015  . 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 Pressure ulcer 05/31/2015  . Primary osteoarthritis of right hip 05/28/2015    No Known Allergies  Past Surgical History:  Procedure Laterality Date  . TOTAL HIP ARTHROPLASTY Right 05/28/2015   Procedure: TOTAL HIP ARTHROPLASTY ANTERIOR APPROACH;  Surgeon: Hessie Knows, MD;  Location: [facility] ORS;  Service: Orthopedics;  Laterality: Right;    Social History   Tobacco Use  . Smoking status: Never Smoker  . Smokeless tobacco: Never Used  Substance Use Topics  . Alcohol use: No  . Drug use: No     Medication list has been reviewed and updated.  Current Meds  Medication Sig  . aspirin EC 81 MG tablet Take 1 tablet (81 mg total) by mouth daily.  Marland Kitchen atorvastatin (LIPITOR) 10 MG tablet Take 1 tablet (10 mg total) by mouth daily.  . cloNIDine (CATAPRES) 0.3 MG tablet TAKE ONE TABLET BY MOUTH 3 TIMES DAILY.  . furosemide (LASIX) 40 MG tablet Take 1 tablet (40 mg total) by mouth 2 (two) times daily.  Marland Kitchen glipiZIDE (GLUCOTROL) 5 MG tablet Take 1 tablet (5mg ) in the am and 1/2 tablet (2.5mg ) in the pm  . glucose blood test strip Use as instructed  . levothyroxine (SYNTHROID) 125 MCG tablet Take 1 tablet (125 mcg total) by mouth daily before breakfast.  . metoprolol succinate (TOPROL-XL) 50 MG 24 hr tablet Take 1 tablet (50 mg total) by mouth daily.  Marland Kitchen telmisartan (MICARDIS) 80 MG tablet TAKE ONE TABLET BY MOUTH EVERY DAY    PHQ 2/9 Scores 11/05/2019 11/04/2019 05/03/2018  05/01/2017  PHQ - 2 Score 0 0 0 0  PHQ- 9 Score 0 - 0 3    BP Readings from Last 3 Encounters:  11/05/19 130/84  04/30/19 (!) 160/90  10/26/18 120/70     Physical Exam Vitals and nursing note reviewed.  Constitutional:      Appearance: She is well-developed.  HENT:     Head: Normocephalic.     Right Ear: Tympanic membrane, ear canal and external ear normal. There is no impacted cerumen.     Left Ear: Tympanic membrane, ear canal and external ear normal. There is no impacted cerumen.     Nose: Nose normal. No congestion or rhinorrhea.     Mouth/Throat:     Mouth: Mucous membranes are moist.  Eyes:     General: Lids are everted, no foreign bodies appreciated. No scleral icterus.       Left eye: No foreign body or hordeolum.     Conjunctiva/sclera: Conjunctivae normal.     Right eye: Right conjunctiva is not injected.     Left eye: Left conjunctiva is not injected.     Pupils: Pupils are equal, round, and reactive to light.  Neck:     Thyroid: No thyromegaly.     Vascular: No JVD.     Trachea: No tracheal deviation.  Cardiovascular:     Rate and Rhythm: Normal rate and regular rhythm.     Heart sounds: Normal heart sounds. No murmur. No friction rub. No gallop.   Pulmonary:     Effort: Pulmonary effort is normal. No respiratory distress.     Breath sounds: Normal breath sounds. No wheezing, rhonchi or rales.  Abdominal:     General: Bowel sounds are normal.     Palpations: Abdomen is soft. There is no mass.     Tenderness: There is no abdominal tenderness. There is no guarding or rebound.  Musculoskeletal:        General: No tenderness. Normal range of motion.     Cervical back: Normal range of motion and neck supple.  Lymphadenopathy:     Cervical: No cervical adenopathy.  Skin:    General: Skin is warm.     Capillary Refill: Capillary refill takes less than 2 seconds.     Findings: No bruising, erythema or rash.  Neurological:     Mental Status: She is alert and oriented to person, place, and time.     Cranial Nerves: No cranial nerve deficit.     Deep Tendon Reflexes: Reflexes normal.  Psychiatric:        Mood and  Affect: Mood is not anxious or depressed.     Wt Readings from Last 3 Encounters:  11/05/19 271 lb (122.9 kg)  04/30/19 276 lb (125.2 kg)  10/26/18 270 lb (122.5 kg)    BP 130/84   Pulse 88   Ht 5\' 5"  (1.651 m)   Wt 271 lb (122.9 kg)   BMI 45.10 kg/m   Assessment and Plan: 1. Familial hypercholesterolemia Chronic.  Controlled.  Stable.  Continue atorvastatin 10 mg once a day. - atorvastatin (LIPITOR) 10 MG tablet; Take 1 tablet (10 mg total) by mouth daily.  Dispense: 90 tablet; Refill: 1  2. Essential hypertension Chronic.  Controlled.  Stable.  Continue clonidine 0.3 mg 1 3 times a day, furosemide 40 mg 1 twice a day, metoprolol XL 50 mg once a day, and Micardis 80 mg once a day.  Will check CMP to assess GFR and electrolytes - cloNIDine (  CATAPRES) 0.3 MG tablet; TAKE ONE TABLET BY MOUTH 3 TIMES DAILY.  Dispense: 270 tablet; Refill: 1 - furosemide (LASIX) 40 MG tablet; Take 1 tablet (40 mg total) by mouth 2 (two) times daily.  Dispense: 180 tablet; Refill: 1 - metoprolol succinate (TOPROL-XL) 50 MG 24 hr tablet; Take 1 tablet (50 mg total) by mouth daily.  Dispense: 90 tablet; Refill: 1 - telmisartan (MICARDIS) 80 MG tablet; TAKE ONE TABLET BY MOUTH EVERY DAY  Dispense: 90 tablet; Refill: 1 - Comprehensive Metabolic Panel (CMET)  3. Edema, unspecified type Chronic.  Controlled.  Stable.  Continue furosemide 40 mg twice a day for control of edema. - furosemide (LASIX) 40 MG tablet; Take 1 tablet (40 mg total) by mouth 2 (two) times daily.  Dispense: 180 tablet; Refill: 1  4. Type 2 diabetes mellitus with microalbuminuria, unspecified whether long term insulin use (HCC) Chronic.  Controlled.  Stable.  Continue glipizide 5 mg 1 tablet in the morning and half tablet in the evening.  We will check CMP and hemoglobin A1c for assessment of diabetic control. - glipiZIDE (GLUCOTROL) 5 MG tablet; Take 1 tablet (5mg ) in the am and 1/2 tablet (2.5mg ) in the pm  Dispense: 135 tablet; Refill:  1 - Comprehensive Metabolic Panel (CMET) - HgB A1c  5. Hypothyroidism, unspecified type Chronic.  Controlled.  Stable.  Will obtain TSH to assess current level of control in the meantime we are anticipating continuance of levothyroxine 125 mcg daily. - levothyroxine (SYNTHROID) 125 MCG tablet; Take 1 tablet (125 mcg total) by mouth daily before breakfast.  Dispense: 90 tablet; Refill: 1 - TSH

## 2019-11-06 ENCOUNTER — Other Ambulatory Visit: Payer: Medicare HMO

## 2019-11-06 DIAGNOSIS — E1129 Type 2 diabetes mellitus with other diabetic kidney complication: Secondary | ICD-10-CM

## 2019-11-06 DIAGNOSIS — R809 Proteinuria, unspecified: Secondary | ICD-10-CM | POA: Diagnosis not present

## 2019-11-06 LAB — COMPREHENSIVE METABOLIC PANEL
ALT: 9 IU/L (ref 0–32)
AST: 15 IU/L (ref 0–40)
Albumin/Globulin Ratio: 1.1 — ABNORMAL LOW (ref 1.2–2.2)
Albumin: 4 g/dL (ref 3.8–4.8)
Alkaline Phosphatase: 88 IU/L (ref 39–117)
BUN/Creatinine Ratio: 16 (ref 12–28)
BUN: 25 mg/dL (ref 8–27)
Bilirubin Total: 0.7 mg/dL (ref 0.0–1.2)
CO2: 24 mmol/L (ref 20–29)
Calcium: 9.2 mg/dL (ref 8.7–10.3)
Chloride: 102 mmol/L (ref 96–106)
Creatinine, Ser: 1.53 mg/dL — ABNORMAL HIGH (ref 0.57–1.00)
GFR calc Af Amer: 41 mL/min/{1.73_m2} — ABNORMAL LOW (ref >59)
GFR calc non Af Amer: 35 mL/min/{1.73_m2} — ABNORMAL LOW (ref >59)
Globulin, Total: 3.8 g/dL (ref 1.5–4.5)
Glucose: 106 mg/dL — ABNORMAL HIGH (ref 65–99)
Potassium: 4.8 mmol/L (ref 3.5–5.2)
Sodium: 137 mmol/L (ref 134–144)
Total Protein: 7.8 g/dL (ref 6.0–8.5)

## 2019-11-06 LAB — TSH: TSH: 0.258 u[IU]/mL — ABNORMAL LOW (ref 0.450–4.500)

## 2019-11-06 LAB — HEMOGLOBIN A1C
Est. average glucose Bld gHb Est-mCnc: 166 mg/dL
Hgb A1c MFr Bld: 7.4 % — ABNORMAL HIGH (ref 4.8–5.6)

## 2019-11-07 LAB — MICROALBUMIN, URINE: Microalbumin, Urine: 15.7 ug/mL

## 2019-11-07 LAB — SPECIMEN STATUS REPORT

## 2020-02-06 ENCOUNTER — Telehealth: Payer: Self-pay | Admitting: *Deleted

## 2020-02-06 NOTE — Chronic Care Management (AMB) (Signed)
  Chronic Care Management   Outreach Note  02/06/2020 Name: VARIE MACHAMER MRN: [id number] DOB: 19-Mar-1956  [name] is a 64 y.o. year old female who is a primary care patient of Juline Patch, MD. I reached out to [name] by phone today in response to a referral sent by Ms. [name] [employer]el's health plan.     An unsuccessful telephone outreach was attempted today. The patient was referred to the case management team for assistance with care management and care coordination.   Follow Up Plan: The care management team will reach out to the patient again over the next 7 days. If patient returns call to provider office, please advise to call [facility] at [phone].  [place], [place] [address] Direct Dial: [phone] [email or web address]

## 2020-02-12 NOTE — Chronic Care Management (AMB) (Signed)
  Chronic Care Management   Outreach Note  02/12/2020 Name: [name] MRN: [id number] DOB: [birth year]  [name] is a 64 y.o. year old female who is a primary care patient of Juline Patch, MD. I reached out to [name] by phone today in response to a referral sent by Ms. [name] [employer]'s health plan.     A second unsuccessful telephone outreach was attempted today. The patient was referred to the case management team for assistance with care management and care coordination.   Follow Up Plan: The care management team will reach out to the patient again over the next 7 days. If patient returns call to provider office, please advise to call [facility] at [phone].  [place], [place] [address] Direct Dial: [phone] [email or web address] [email or web address] Website: [email or web address]

## 2020-02-19 NOTE — Chronic Care Management (AMB) (Signed)
  Chronic Care Management   Note  02/19/2020 Name: TYLA BURGNER MRN: [id number] DOB: 10/15/1955  [name] is a 64 y.o. year old female who is a primary care patient of Juline Patch, MD. I reached out to [name] by phone today in response to a referral sent by Ms. [name] Kelly's health plan.     Ms. Pike was given information about Chronic Care Management services today including:  1. CCM service includes personalized support from designated clinical staff supervised by her physician, including individualized plan of care and coordination with other care providers 2. 24/7 contact phone numbers for assistance for urgent and routine care needs. 3. Service will only be billed when office clinical staff spend 20 minutes or more in a month to coordinate care. 4. Only one practitioner may furnish and bill the service in a calendar month. 5. The patient may stop CCM services at any time (effective at the end of the month) by phone call to the office staff. 6. The patient will be responsible for cost sharing (co-pay) of up to 20% of the service fee (after annual deductible is met).  Patient agreed to services and verbal consent obtained.   Follow up plan: Telephone appointment with care management team member scheduled for:02/20/2020  [facility]  [place], [place] [address] Direct Dial: [email or web address] Website: [email or web address]

## 2020-02-20 ENCOUNTER — Ambulatory Visit: Payer: Medicare HMO | Admitting: Pharmacist

## 2020-02-20 ENCOUNTER — Ambulatory Visit: Payer: Medicare HMO | Admitting: Family Medicine

## 2020-02-20 DIAGNOSIS — I10 Essential (primary) hypertension: Secondary | ICD-10-CM

## 2020-02-20 DIAGNOSIS — E1129 Type 2 diabetes mellitus with other diabetic kidney complication: Secondary | ICD-10-CM

## 2020-02-20 NOTE — Patient Instructions (Addendum)
Visit Information [name], It was a pleasure speaking with you today! Thank you for letting me be a part of your care team. I know we can reach your [place] of [place]! I believe in you. Please call me  any questions or concerns.  Kindest Regards,  [name], PharmD- Pharmacist [phone]  Goals Addressed            This Visit's Progress   . PharmD "I'd like to be able to [place] the [place]"       [name] (see longitudinal plan of care for additional care plan information)  Current Barriers:  . Chronic Disease Management support, education, and care coordination needs related to Hypertension, Hyperlipidemia, and Diabetes    Hypertension BP Readings from Last 3 Encounters:  11/05/19 130/84  04/30/19 (!) 160/90  10/26/18 120/70   . Pharmacist Clinical Goal(s): o Over the next 90 days, patient will work with PharmD and providers to maintain BP goal <130/80 . Current regimen:   Clonidine 0.3 tid  Furosemide 40 mg bid  Metoprolol succinate 50 mg bid  Telmisartan 80 mg qd . Interventions: . Comprehensive medication review performed, medication list updated in electronic medical record . Inter-disciplinary care team collaboration (see longitudinal plan of care) o Encouraged patient to follow-up with Cardiology for recommended echocardiogram. . Patient self care activities - Over the next 90 days, patient will: o Check BP daily document, and provide at future appointments o Ensure daily salt intake < 2300 mg/day o Focus on low sodium, reduced saturated fat diet. o Strive to increase exercise with goal of 30 minutes, 5 days per week.  Hyperlipidemia Lab Results  Component Value Date/Time   LDLCALC 142 (H) 10/26/2018 11:45 AM   . Pharmacist Clinical Goal(s): o Over the next 90 days, patient will work with PharmD and providers to achieve LDL goal < 70 . Current regimen:  . Atorvastatin 10 mg qam . Aspirin 81 mg  qam . Interventions: . Comprehensive medication review performed, medication list updated in electronic medical record . Inter-disciplinary care team collaboration (see longitudinal plan of care) o Will discuss increasing atorvastatin dose with PCP. Marland Kitchen Patient self care activities - Over the next 90 days, patient will: o Focus on low sodium, reduced saturated fat diet. o Strive to increase exercise with goal of 30 minutes, 5 days per week.  Diabetes Lab Results  Component Value Date/Time   HGBA1C 7.4 (H) 11/05/2019 03:44 PM   HGBA1C 7.3 (H) 07/09/2019 02:27 PM   . Pharmacist Clinical Goal(s): o Over the next 90 days, patient will work with PharmD and providers to achieve A1c goal <7% . Current regimen:  o Glipizide 5 mg qam, 2.5 mg qpm . Interventions: . Comprehensive medication review performed, medication list updated in electronic medical record . Inter-disciplinary care team collaboration (see longitudinal plan of care) o Counseled patient on the benefit of SGLT2 including weight loss and cardiovascular risk reduction in addition to A1c reduction. o Will discuss with PCP . Patient self care activities - Over the next 90 days, patient will: o Check blood sugar twice daily, document, and provide at future appointments o Check fasting blood glucose and 2 hours after a meal and document provide at future appointments to help guide therapy. o Contact provider with any episodes of hypoglycemia    Medication management . Pharmacist Clinical Goal(s): o Over the next 90 days, patient will work with PharmD and providers to achieve optimal medication adherence . Current  pharmacy: [facility] . Interventions o Comprehensive medication review performed. o Continue current medication management strategy . Patient self care activities - Over the next 90  days, patient will: o Take medications as prescribed o Report any questions or concerns to PharmD and/or provider(s)  Initial goal  documentation        [name] was given information about Chronic Care Management services today including:  1. CCM service includes personalized support from designated clinical staff supervised by her physician, including individualized plan of care and coordination with other care providers 2. 24/7 contact phone numbers for assistance for urgent and routine care needs. 3. Standard insurance, coinsurance, copays and deductibles apply for chronic care management only during months in which we provide at least 20 minutes of these services. Most insurances cover these services at 100%, however patients may be responsible for any copay, coinsurance and/or deductible if applicable. This service may help you avoid the need for more expensive face-to-face services. 4. Only one practitioner may furnish and bill the service in a calendar month. 5. The patient may stop CCM services at any time (effective at the end of the month) by phone call to the office staff.  Patient agreed to services and verbal consent obtained.   The patient verbalized understanding of instructions provided today and agreed to receive a mailed copy of patient instruction and/or educational materials. Telephone follow up appointment with pharmacy team member scheduled for: 05/12/20 @ 1 pm  [name]. Kenton Kingfisher PharmD, BCPS Clinical Pharmacist [phone]  Diabetes Mellitus and Standards of Medical Care Managing diabetes (diabetes mellitus) can be complicated. Your diabetes treatment may be managed by a team of health care providers, including:  A physician who specializes in diabetes (endocrinologist).  A nurse practitioner or physician assistant.  Nurses.  A diet and nutrition specialist (registered dietitian).  A certified diabetes educator (CDE).  An exercise specialist.  A pharmacist.  An eye doctor.  A foot specialist (podiatrist).  A dentist.  A primary care provider.  A mental health provider. Your  health care providers follow guidelines to help you get the best quality of care. The following schedule is a general guideline for your diabetes management plan. Your health care providers may give you more specific instructions. Physical exams Upon being diagnosed with diabetes mellitus, and each year after that, your health care provider will ask about your medical and family history. He or she will also do a physical exam. Your exam may include:  Measuring your height, weight, and body mass index (BMI).  Checking your blood pressure. This will be done at every routine medical visit. Your target blood pressure may vary depending on your medical conditions, your age, and other factors.  Thyroid gland exam.  Skin exam.  Screening for damage to your nerves (peripheral neuropathy). This may include checking the pulse in your legs and feet and checking the level of sensation in your hands and feet.  A complete foot exam to inspect the structure and skin of your feet, including checking for cuts, bruises, redness, blisters, sores, or other problems.  Screening for blood vessel (vascular) problems, which may include checking the pulse in your legs and feet and checking your temperature. Blood tests Depending on your treatment plan and your personal needs, you may have the following tests done:  HbA1c (hemoglobin A1c). This test provides information about blood sugar (glucose) control over the previous 2-3 months. It is used to adjust your treatment plan, if needed. This test will be done: ?  At least 2 times a year, if you are meeting your treatment goals. ? 4 times a year, if you are not meeting your treatment goals or if treatment goals have changed.  Lipid testing, including total, LDL, and HDL cholesterol and triglyceride levels. ? The goal for LDL is less than 100 mg/dL (5.5 mmol/L). If you are at high risk for complications, the goal is less than 70 mg/dL (3.9 mmol/L). ? The goal for HDL  is 40 mg/dL (2.2 mmol/L) or higher for men and 50 mg/dL (2.8 mmol/L) or higher for women. An HDL cholesterol of 60 mg/dL (3.3 mmol/L) or higher gives some protection against heart disease. ? The goal for triglycerides is less than 150 mg/dL (8.3 mmol/L).  Liver function tests.  Kidney function tests.  Thyroid function tests. Dental and eye exams  Visit your dentist two times a year.  If you have type 1 diabetes, your health care provider may recommend an eye exam 3-5 years after you are diagnosed, and then once a year after your first exam. ? For children with type 1 diabetes, a health care provider may recommend an eye exam when your child is age 33 or older and has had diabetes for 3-5 years. After the first exam, your child should get an eye exam once a year.  If you have type 2 diabetes, your health care provider may recommend an eye exam as soon as you are diagnosed, and then once a year after your first exam. Immunizations   The yearly flu (influenza) vaccine is recommended for everyone 6 months or older who has diabetes.  The pneumonia (pneumococcal) vaccine is recommended for everyone 2 years or older who has diabetes. If you are 7 or older, you may get the pneumonia vaccine as a series of two separate shots.  The hepatitis B vaccine is recommended for adults shortly after being diagnosed with diabetes.  Adults and children with diabetes should receive all other vaccines according to age-specific recommendations from the Centers for Disease Control and Prevention (CDC). Mental and emotional health Screening for symptoms of eating disorders, anxiety, and depression is recommended at the time of diagnosis and afterward as needed. If your screening shows that you have symptoms (positive screening result), you may need more evaluation and you may work with a mental health care provider. Treatment plan Your treatment plan will be reviewed at every medical visit. You and your health  care provider will discuss:  How you are taking your medicines, including insulin.  Any side effects you are experiencing.  Your blood glucose target goals.  The frequency of your blood glucose monitoring.  Lifestyle habits, such as activity level as well as tobacco, alcohol, and substance use. Diabetes self-management education Your health care provider will assess how well you are monitoring your blood glucose levels and whether you are taking your insulin correctly. He or she may refer you to:  A certified diabetes educator to manage your diabetes throughout your life, starting at diagnosis.  A registered dietitian who can create or review your personal nutrition plan.  An exercise specialist who can discuss your activity level and exercise plan. Summary  Managing diabetes (diabetes mellitus) can be complicated. Your diabetes treatment may be managed by a team of health care providers.  Your health care providers follow guidelines in order to help you get the best quality of care.  Standards of care including having regular physical exams, blood tests, blood pressure monitoring, immunizations, screening tests, and education about how  to manage your diabetes.  Your health care providers may also give you more specific instructions based on your individual health. This information is not intended to replace advice given to you by your health care provider. Make sure you discuss any questions you have with your health care provider. Document Revised: 03/02/2018 Document Reviewed: 03/11/2016 Elsevier Patient Education  [place].

## 2020-02-20 NOTE — Progress Notes (Signed)
Chronic Care Management Pharmacy  Name: [name]  MRN: [id number] DOB: [birth year]   Chief Complaint/ HPI  [name],  64 y.o. , female presents for their Initial CCM visit with the clinical pharmacist via telephone due to COVID-19 Pandemic.  PCP : Juline Patch, MD Patient Care Team: Juline Patch, MD as PCP - General (Family Medicine) Ubaldo Glassing Javier Docker, MD as Consulting Physician (Cardiology) Vladimir Faster, [facility] (Pharmacist)  Their chronic conditions include: Hypertension, Hyperlipidemia, Diabetes, Chronic Kidney Disease, Hypothyroidism and Osteoarthritis   Office Visits: 11/05/19- Dr. Ronnald Ramp - bloodwork  Consult Visit: 05/30/19- Cardiology - SOB OE, echo recommended possible HF, determine LV function, ? Lymphedema, compression hose, suggest empagliflozin  No Known Allergies  Medications: Outpatient Encounter Medications as of 02/20/2020  Medication Sig  . acetaminophen (TYLENOL) 500 MG tablet Take 500 mg by mouth every 6 (six) hours as needed.  Marland Kitchen aspirin EC 81 MG tablet Take 1 tablet (81 mg total) by mouth daily.  Marland Kitchen atorvastatin (LIPITOR) 10 MG tablet Take 1 tablet (10 mg total) by mouth daily.  . cloNIDine (CATAPRES) 0.3 MG tablet TAKE ONE TABLET BY MOUTH 3 TIMES DAILY.  . furosemide (LASIX) 40 MG tablet Take 1 tablet (40 mg total) by mouth 2 (two) times daily.  Marland Kitchen glipiZIDE (GLUCOTROL) 5 MG tablet Take 1 tablet (29m) in the am and 1/2 tablet (2.52m in the pm  . glucose blood test strip Use as instructed  . levothyroxine (SYNTHROID) 125 MCG tablet Take 1 tablet (125 mcg total) by mouth daily before breakfast.  . metoprolol succinate (TOPROL-XL) 50 MG 24 hr tablet Take 1 tablet (50 mg total) by mouth daily.  . Marland Kitchenelmisartan (MICARDIS) 80 MG tablet TAKE ONE TABLET BY MOUTH EVERY DAY   No facility-administered encounter medications on file as of 02/20/2020.     Current Diagnosis/Assessment:    Goals Addressed            This Visit's Progress   .  PharmD "I'd [place]"       [place] care plan information[place]  Current Barriers:  . Chronic Disease Management support, education, and care coordination needs related to Hypertension, Hyperlipidemia, and Diabetes    Hypertension BP Readings from Last 3 Encounters:  11/05/19 130/84  04/30/19 (!) 160/90  10/26/18 120/70   . Pharmacist Clinical Goal(s): o Over the next 90 days, patient will work with PharmD and providers to maintain BP goal <130/80 . Current regimen:   Clonidine 0.3 tid  Furosemide 40 mg bid  Metoprolol succinate 50 mg bid  Telmisartan 80 mg qd . Interventions: . Comprehensive medication review performed, medication list updated in electronic medical record . Inter-disciplinary care team collaboration (see longitudinal plan of care) o Encouraged patient to follow-up with Cardiology for recommended echocardiogram. . Patient self care activities - Over the next 90 days, patient will: o Check BP daily document, and provide at future appointments o Ensure daily salt intake < 2300 mg/day o Focus on low sodium, reduced saturated fat diet. o Strive to increase exercise with goal of 30 minutes, 5 days per week.  Hyperlipidemia Lab Results  Component Value Date/Time   LDLCALC 142 (H) 10/26/2018 11:45 AM   . Pharmacist Clinical Goal(s): o Over the next 90 days, patient will work with PharmD and providers to achieve LDL goal < 70 . Current regimen:  . Atorvastatin 10 mg qam .  Aspirin 81 mg qam . Interventions: . Comprehensive medication review performed, medication list updated in electronic medical record . Inter-disciplinary care team collaboration (see longitudinal plan of care) o Will discuss increasing atorvastatin dose with PCP. Marland Kitchen Patient self care activities - Over the next 90 days, patient will: o Focus on low sodium, reduced saturated fat diet. o Strive to increase  exercise with goal of 30 minutes, 5 days per week.  Diabetes Lab Results  Component Value Date/Time   HGBA1C 7.4 (H) 11/05/2019 03:44 PM   HGBA1C 7.3 (H) 07/09/2019 02:27 PM   . Pharmacist Clinical Goal(s): o Over the next 90 days, patient will work with PharmD and providers to achieve A1c goal <7% . Current regimen:  o Glipizide 5 mg qam, 2.5 mg qpm . Interventions: . Comprehensive medication review performed, medication list updated in electronic medical record . Inter-disciplinary care team collaboration (see longitudinal plan of care) o Counseled patient on the benefit of SGLT2 including weight loss and cardiovascular risk reduction in addition to A1c reduction. o Will discuss with PCP . Patient self care activities - Over the next 90 days, patient will: o Check blood sugar twice daily, document, and provide at future appointments o Check fasting blood glucose and 2 hours after a meal and document provide at future appointments to help guide therapy. o Contact provider with any episodes of hypoglycemia    Medication management . Pharmacist Clinical Goal(s): o Over the next 90 days, patient will work with PharmD and providers to achieve optimal medication adherence . Current pharmacy: [facility] . Interventions o Comprehensive medication review performed. o Continue current medication management strategy . Patient self care activities - Over the next 90  days, patient will: o Take medications as prescribed o Report any questions or concerns to PharmD and/or provider(s)  Initial goal documentation          Diabetes   A1c goal <7%  Recent Relevant Labs: Lab Results  Component Value Date/Time   HGBA1C 7.4 (H) 11/05/2019 03:44 PM   HGBA1C 7.3 (H) 07/09/2019 02:27 PM   CMP Latest Ref Rng & Units 11/05/2019 04/30/2019 10/26/2018  Glucose 65 - 99 mg/dL 106(H) 128(H) 108(H)  BUN 8 - 27 mg/dL 25 21 33(H)  Creatinine 0.57 - 1.00 mg/dL 1.53(H) 1.43(H) 1.82(H)  Sodium 134 -  144 mmol/L 137 137 140  Potassium 3.5 - 5.2 mmol/L 4.8 4.4 4.5  Chloride 96 - 106 mmol/L 102 101 100  CO2 20 - 29 mmol/L 24 19(L) 20  Calcium 8.7 - 10.3 mg/dL 9.2 9.6 9.4  Total Protein 6.0 - 8.5 g/dL 7.8 - -  Total Bilirubin 0.0 - 1.2 mg/dL 0.7 - -  Alkaline Phos 39 - 117 IU/L 88 - -  AST 0 - 40 IU/L 15 - -  ALT 0 - 32 IU/L 9 - -   EGFR~33 mL/min  Last diabetic Eye exam: No results found for: HMDIABEYEEXA  Last diabetic Foot exam: No results found for: HMDIABFOOTEX   Checking BG: Daily usually before breakfast Sahe does not check post-prandial Recent FBG Readings: 133 today. Reports her readings are usually in the 130's  She reports episodes of hypoglycemia with most recent 2-3 weeks ago. She states she became "shaky and hungry". Her BG at that time was 89.  Patient is currently uncontrolled on the following medications: Marland Kitchen Glipizide 5 mg qam, 2.5 mg qpm  We discussed: diet and exercise extensively and signs/symptoms and treatment of hypoglycemia. Patients endorsed eating too much bread. She does  not cook and often eats packaged [employer] or [employer] or sandwiches. She reports eating more sandwiches than usual because tomatoes are in season and she loves tomato sandwiches. She does read labels and tries to purchase items with the least sodium. She drinks two 16-ounce diet, caffeine-free Mountain Dews daily and equally as much water. We discussed the importance of checking post-prandials to help guide future therapy.  We discussed the upward trend in her T9Q and complications associated with uncontrolled diabetes. She is very concerned about her kidney function and her heart. She does currently exercise due to back pain which causes her walk hunched over. She uses a walker at baseline.Patient would benefit from transition to Empagliflozin. We discussed the benefits and risks including weight loss, minimal hypoglycemia, reduced risk cardiovascular death as well as increased UTI and  yeast infections.  Patient does not want to go on any type of injectable therapy including insulin.  Plan  Recommend follow-up HbA1c 3-6 months from last reading given result above goal and upward trend. Consider changing to Empagliflozin at next appointment if eGFR still >30 ml/min.    Hypertension/CKD IV/EDEMA   BP goal is:  <130/80  Office blood pressures are  BP Readings from Last 3 Encounters:  11/05/19 130/84  04/30/19 (!) 160/90  10/26/18 120/70   BMP Latest Ref Rng & Units 11/05/2019 04/30/2019 10/26/2018  Glucose 65 - 99 mg/dL 106(H) 128(H) 108(H)  BUN 8 - 27 mg/dL 25 21 33(H)  Creatinine 0.57 - 1.00 mg/dL 1.53(H) 1.43(H) 1.82(H)  BUN/Creat Ratio 12 - _0 Sodium 134 - 144 mmol/L 137 137 140  Potassium 3.5 - 5.2 mmol/L 4.8 4.4 4.5  Chloride 96 - 106 mmol/L 102 101 100  CO2 20 - 29 mmol/L 24 19(L) 20  Calcium 8.7 - 10.3 mg/dL 9.2 9.6 9.4    Patient checks BP at home infrequently Patient states her monitor needs batteries and can not remember the last time she checked it at home.  Patient has failed these meds in the past: lopressor 25 mg Patient is currently controlled on the following medications:   Clonidine 0.3 tid  Furosemide 40 mg bid  Metoprolol succinate 50 mg bid  Telmisartan 80 mg qd  We discussed diet and exercise extensively. Patient does not cook and eats a lot of packaged meals. She has not followed up with cardiology or scheduled echocardiogram. She wears Juxtalite compression wraps and sits in recliner with legs elevated most of the time. Does not have scales at home. BP monitor needs batteries. She will get batteries and begin checking BP.  Plan  Continue current medications.  Recommend patient follow up with cardiology for echol.    Hyperlipidemia   LDL goal < 70  Lipid Panel     Component Value Date/Time   CHOL 200 (H) 10/26/2018 1145   TRIG 79 10/26/2018 1145   HDL 42 10/26/2018 1145   LDLCALC 142 (H) 10/26/2018 1145     Hepatic Function Latest Ref Rng & Units 11/05/2019 04/30/2019 10/26/2018  Total Protein 6.0 - 8.5 g/dL 7.8 - -  Albumin 3.8 - 4.8 g/dL 4.0 4.0 4.3  AST 0 - 40 IU/L 15 - -  ALT 0 - 32 IU/L 9 - -  Alk Phosphatase 39 - 117 IU/L 88 - -  Total Bilirubin 0.0 - 1.2 mg/dL 0.7 - -     The 10-year ASCVD risk score Mikey Bussing DC Jr., et al., 2013) is: 17.4%   Values used  to calculate the score:     Age: 51 years     Sex: Female     Is Non-Hispanic African American: No     Diabetic: Yes     Tobacco smoker: No     Systolic Blood Pressure: 599 mmHg     Is BP treated: Yes     HDL Cholesterol: 42 mg/dL     Total Cholesterol: 200 mg/dL   Patient has failed these meds in past: N/A Patient is currently uncontrolled on the following medications:  . Atorvastatin 10 mg qam . Aspirin 81 mg qam  We discussed:  diet and exercise extensively.  We discussed ASCVD risk calculation and LDL goals in patients with diabetes. Patient has limited mobility due to back pain. Uses a walker.  Plan  Recommend consider increasing atorvastatin dose to 20 mg to achieve goal ldl <70.  Hypothyroidism   Lab Results  Component Value Date/Time   TSH 0.258 (L) 11/05/2019 03:44 PM   TSH 0.095 (L) 07/09/2019 02:27 PM    Patient has failed these meds in past: none Patient is currently uncontrolled on the following medications:  . Levothyroxine 125 mcg qam  We discussed:  Taking first thing each morning 30 minutes before food or other medications. Patient denies any heart palpitations, difficulty falling asleep or excessive sweating.  Plan  Recommend consider further decrease of levothyroxine to 112 mcg and recheck TSH in 6-8 weeks.  Vaccines   Reviewed and discussed patient's vaccination history.    Immunization History  Administered Date(s) Administered  . Tdap 05/03/2018    Plan  Patient is comtemplating COVID vaccine and is not interested in Shingrix or flu vaccine at this time.   Medication Management    Pt uses [facility] pharmacy for all medications Pt endorses 90-100% compliance  We discussed: Patient has prescriptions delivered and likes using [facility] because it is close to her house.   Plan  Continue current medication management strategy    Follow up: 3 month phone visit  Junita Push. Kenton Kingfisher PharmD, [facility] [phone]

## 2020-03-30 ENCOUNTER — Ambulatory Visit: Payer: Medicare HMO | Admitting: Family Medicine

## 2020-04-28 ENCOUNTER — Other Ambulatory Visit: Payer: Self-pay | Admitting: Family Medicine

## 2020-04-28 DIAGNOSIS — E1129 Type 2 diabetes mellitus with other diabetic kidney complication: Secondary | ICD-10-CM

## 2020-04-28 NOTE — Telephone Encounter (Signed)
Requested Prescriptions  Pending Prescriptions Disp Refills   glipiZIDE (GLUCOTROL) 5 MG tablet [Pharmacy Med Name: glipiZIDE 5 MG Oral Tablet] 135 tablet 0    Sig: TAKE 1 TABLET BY MOUTH IN THE MORNING AND 1/2 (ONE-HALF) TABLET IN THE EVENING     Endocrinology:  Diabetes - Sulfonylureas Passed - 04/28/2020  7:56 PM      Passed - HBA1C is between 0 and 7.9 and within 180 days    Hgb A1c MFr Bld  Date Value Ref Range Status  11/05/2019 7.4 (H) 4.8 - 5.6 % Final    Comment:             Prediabetes: 5.7 - 6.4          Diabetes: >6.4          Glycemic control for adults with diabetes: <7.0          Passed - Valid encounter within last 6 months    Recent Outpatient Visits          5 months ago Essential hypertension   [facility] Juline Patch, MD   12 months ago Type 2 diabetes mellitus with microalbuminuria, unspecified whether long term insulin use ([place])   [facility] Juline Patch, MD   1 year ago Essential hypertension   [facility] Juline Patch, MD   1 year ago Type 2 diabetes mellitus with microalbuminuria, unspecified whether long term insulin use ([place])   [facility] Juline Patch, MD   2 years ago Type 2 diabetes mellitus with microalbuminuria, without long-term current use of insulin ([place])   [facility] Juline Patch, MD      Future Appointments            In 2 days Juline Patch, MD [facility], [facility]

## 2020-04-30 ENCOUNTER — Ambulatory Visit (INDEPENDENT_AMBULATORY_CARE_PROVIDER_SITE_OTHER): Payer: Medicare HMO | Admitting: Family Medicine

## 2020-04-30 ENCOUNTER — Other Ambulatory Visit: Payer: Self-pay

## 2020-04-30 ENCOUNTER — Encounter: Payer: Self-pay | Admitting: Family Medicine

## 2020-04-30 VITALS — BP 130/80 | HR 98 | Ht 65.0 in | Wt 279.0 lb

## 2020-04-30 DIAGNOSIS — I10 Essential (primary) hypertension: Secondary | ICD-10-CM | POA: Diagnosis not present

## 2020-04-30 DIAGNOSIS — E78019 Familial hypercholesterolemia, unspecified: Secondary | ICD-10-CM

## 2020-04-30 DIAGNOSIS — R809 Proteinuria, unspecified: Secondary | ICD-10-CM | POA: Diagnosis not present

## 2020-04-30 DIAGNOSIS — R609 Edema, unspecified: Secondary | ICD-10-CM | POA: Diagnosis not present

## 2020-04-30 DIAGNOSIS — E1129 Type 2 diabetes mellitus with other diabetic kidney complication: Secondary | ICD-10-CM

## 2020-04-30 DIAGNOSIS — E039 Hypothyroidism, unspecified: Secondary | ICD-10-CM | POA: Diagnosis not present

## 2020-04-30 DIAGNOSIS — Z91199 Patient's noncompliance with other medical treatment and regimen due to unspecified reason: Secondary | ICD-10-CM

## 2020-04-30 DIAGNOSIS — E7801 Familial hypercholesterolemia: Secondary | ICD-10-CM | POA: Diagnosis not present

## 2020-04-30 DIAGNOSIS — R7989 Other specified abnormal findings of blood chemistry: Secondary | ICD-10-CM

## 2020-04-30 DIAGNOSIS — Z9119 Patient's noncompliance with other medical treatment and regimen: Secondary | ICD-10-CM | POA: Diagnosis not present

## 2020-04-30 MED ORDER — GLIPIZIDE 5 MG PO TABS: ORAL_TABLET | ORAL | 1 refills | Status: DC

## 2020-04-30 MED ORDER — CLONIDINE HCL 0.3 MG PO TABS: ORAL_TABLET | ORAL | 1 refills | Status: DC

## 2020-04-30 MED ORDER — TELMISARTAN 80 MG PO TABS: ORAL_TABLET | ORAL | 1 refills | Status: DC

## 2020-04-30 MED ORDER — METOPROLOL SUCCINATE ER 50 MG PO TB24: 50.0000 mg | ORAL_TABLET | Freq: Every day | ORAL | 1 refills | Status: DC

## 2020-04-30 MED ORDER — FUROSEMIDE 40 MG PO TABS: 40.0000 mg | ORAL_TABLET | Freq: Two times a day (BID) | ORAL | 1 refills | Status: DC

## 2020-04-30 MED ORDER — ATORVASTATIN CALCIUM 10 MG PO TABS: 10.0000 mg | ORAL_TABLET | Freq: Every day | ORAL | 1 refills | Status: DC

## 2020-04-30 MED ORDER — LEVOTHYROXINE SODIUM 125 MCG PO TABS: 125.0000 ug | ORAL_TABLET | Freq: Every day | ORAL | 1 refills | Status: DC

## 2020-04-30 NOTE — Progress Notes (Signed)
Date:  04/30/2020   Name:  [name]   DOB:  April 07, 1956   MRN:  [id number]   Chief Complaint: Hyperlipidemia, Hypothyroidism, Hypertension, Diabetes, and Edema  Hyperlipidemia This is a chronic problem. The current episode started more than 1 year ago. The problem is controlled. Recent lipid tests were reviewed and are normal. She has no history of chronic renal disease, diabetes, hypothyroidism, liver disease, obesity or nephrotic syndrome. Factors aggravating her hyperlipidemia include thiazides. Pertinent negatives include no chest pain, focal sensory loss, focal weakness, leg pain, myalgias or shortness of breath. Current antihyperlipidemic treatment includes statins. The current treatment provides moderate improvement of lipids. There are no compliance problems.  Risk factors for coronary artery disease include diabetes mellitus, dyslipidemia, hypertension and obesity.  Hypertension This is a chronic problem. The problem has been waxing and waning since onset. The problem is controlled. Pertinent negatives include no chest pain, headaches, neck pain, palpitations or shortness of breath. Risk factors for coronary artery disease include dyslipidemia, diabetes mellitus and obesity. Past treatments include angiotensin blockers, beta blockers and diuretics (clondine). The current treatment provides moderate improvement. There are no compliance problems.  There is no history of angina, kidney disease, CAD/MI, CVA, heart failure, left ventricular hypertrophy, PVD or retinopathy. There is no history of chronic renal disease, a hypertension causing med or renovascular disease.  Diabetes She presents for her follow-up diabetic visit. She has type 2 diabetes mellitus. Pertinent negatives for hypoglycemia include no dizziness, headaches or nervousness/anxiousness. Pertinent negatives for diabetes include no chest pain and no polydipsia. Pertinent negatives for diabetic complications include no CVA,  PVD or retinopathy. Current diabetic treatment includes oral agent (monotherapy). She is compliant with treatment some of the time. An ACE inhibitor/angiotensin II receptor blocker is being taken. Eye exam is not current.    Lab Results  Component Value Date   CREATININE 1.53 (H) 11/05/2019   BUN 25 11/05/2019   NA 137 11/05/2019   K 4.8 11/05/2019   CL 102 11/05/2019   CO2 24 11/05/2019   Lab Results  Component Value Date   CHOL 200 (H) 10/26/2018   HDL 42 10/26/2018   LDLCALC 142 (H) 10/26/2018   TRIG 79 10/26/2018   CHOLHDL 4.4 05/01/2017   Lab Results  Component Value Date   TSH 0.258 (L) 11/05/2019   Lab Results  Component Value Date   HGBA1C 7.4 (H) 11/05/2019   Lab Results  Component Value Date   WBC 8.5 05/31/2015   HGB 8.4 (L) 05/31/2015   HCT 25.8 (L) 05/31/2015   MCV 88.5 05/31/2015   PLT 186 05/31/2015   Lab Results  Component Value Date   ALT 9 11/05/2019   AST 15 11/05/2019   ALKPHOS 88 11/05/2019   BILITOT 0.7 11/05/2019     Review of Systems  Constitutional: Negative for chills and fever.  HENT: Negative for drooling, ear discharge, ear pain, postnasal drip and sore throat.   Respiratory: Negative for cough, shortness of breath and wheezing.   Cardiovascular: Negative for chest pain, palpitations and leg swelling.  Gastrointestinal: Negative for abdominal pain, blood in stool, constipation, diarrhea and nausea.  Endocrine: Negative for polydipsia.  Genitourinary: Negative for dysuria, frequency, hematuria and urgency.  Musculoskeletal: Negative for back pain, myalgias and neck pain.  Skin: Negative for rash.  Allergic/Immunologic: Negative for environmental allergies.  Neurological: Negative for dizziness, focal weakness and headaches.  Hematological: Does not bruise/bleed easily.  Psychiatric/Behavioral: Negative for suicidal ideas. The patient is not  nervous/anxious.     Patient Active Problem List   Diagnosis Date Noted  . Morbid  obesity (HCC) 10/24/2017  . Chronic kidney disease, stage IV (severe) (HCC) 10/24/2017  . Hypothyroidism 11/15/2016  . Edema 11/15/2016  . Type 2 diabetes mellitus with microalbuminuria, without long-term current use of insulin (HCC) 11/15/2016  . Essential hypertension 12/09/2015  . 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) 06/26/2015  . Pressure ulcer 05/31/2015  . Primary osteoarthritis of right hip 05/28/2015    No Known Allergies  Past Surgical History:  Procedure Laterality Date  . TOTAL HIP ARTHROPLASTY Right 05/28/2015   Procedure: TOTAL HIP ARTHROPLASTY ANTERIOR APPROACH;  Surgeon: Kennedy Bucker, MD;  Location: [facility] ORS;  Service: Orthopedics;  Laterality: Right;    Social History   Tobacco Use  . Smoking status: Never Smoker  . Smokeless tobacco: Never Used  Substance Use Topics  . Alcohol use: No  . Drug use: No     Medication list has been reviewed and updated.  Current Meds  Medication Sig  . acetaminophen (TYLENOL) 500 MG tablet Take 500 mg by mouth every 6 (six) hours as needed.  Marland Kitchen aspirin EC 81 MG tablet Take 1 tablet (81 mg total) by mouth daily.  Marland Kitchen atorvastatin (LIPITOR) 10 MG tablet Take 1 tablet (10 mg total) by mouth daily.  . cloNIDine (CATAPRES) 0.3 MG tablet TAKE ONE TABLET BY MOUTH 3 TIMES DAILY.  . furosemide (LASIX) 40 MG tablet Take 1 tablet (40 mg total) by mouth 2 (two) times daily.  Marland Kitchen glipiZIDE (GLUCOTROL) 5 MG tablet TAKE 1 TABLET BY MOUTH IN THE MORNING AND 1/2 (ONE-HALF) TABLET IN THE EVENING  . glucose blood test strip Use as instructed  . levothyroxine (SYNTHROID) 125 MCG tablet Take 1 tablet (125 mcg total) by mouth daily before breakfast.  . metoprolol succinate (TOPROL-XL) 50 MG 24 hr tablet Take 1 tablet (50 mg total) by mouth daily.  Marland Kitchen telmisartan (MICARDIS) 80 MG tablet TAKE ONE TABLET BY MOUTH EVERY DAY    PHQ 2/9 Scores 04/30/2020 11/05/2019 11/04/2019 05/03/2018  PHQ - 2 Score 0 0 0 0  PHQ-  9 Score 0 0 - 0    GAD 7 : Generalized Anxiety Score 04/30/2020 11/05/2019  Nervous, Anxious, on Edge 0 0  Control/stop worrying 0 0  Worry too much - different things 0 0  Trouble relaxing 0 0  Restless 0 0  Easily annoyed or irritable 0 0  Afraid - awful might happen 0 0  Total GAD 7 Score 0 0    BP Readings from Last 3 Encounters:  04/30/20 130/80  11/05/19 130/84  04/30/19 (!) 160/90    Physical Exam Vitals and nursing note reviewed.  Constitutional:      Appearance: She is well-developed.  HENT:     Head: Normocephalic.     Right Ear: External ear normal.     Left Ear: External ear normal.  Eyes:     General: Lids are everted, no foreign bodies appreciated. No scleral icterus.       Left eye: No foreign body or hordeolum.     Conjunctiva/sclera: Conjunctivae normal.     Right eye: Right conjunctiva is not injected.     Left eye: Left conjunctiva is not injected.     Pupils: Pupils are equal, round, and reactive to light.  Neck:     Thyroid: No thyromegaly.     Vascular: No JVD.     Trachea: No  tracheal deviation.  Cardiovascular:     Rate and Rhythm: Normal rate and regular rhythm.     Chest Wall: PMI is not displaced.     Pulses: Normal pulses.     Heart sounds: Normal heart sounds, S1 normal and S2 normal. No murmur heard.  No friction rub. No gallop. No S3 or S4 sounds.   Pulmonary:     Effort: Pulmonary effort is normal. No respiratory distress.     Breath sounds: Normal breath sounds. No wheezing, rhonchi or rales.  Abdominal:     General: Bowel sounds are normal.     Palpations: Abdomen is soft. There is no mass.     Tenderness: There is no abdominal tenderness. There is no guarding or rebound.  Musculoskeletal:        General: No tenderness. Normal range of motion.     Cervical back: Normal range of motion and neck supple.  Lymphadenopathy:     Cervical: No cervical adenopathy.  Skin:    General: Skin is warm.     Findings: No rash.  Neurological:      Mental Status: She is alert and oriented to person, place, and time.     Cranial Nerves: No cranial nerve deficit.     Deep Tendon Reflexes: Reflexes normal.  Psychiatric:        Mood and Affect: Mood is not anxious or depressed.     Wt Readings from Last 3 Encounters:  04/30/20 279 lb (126.6 kg)  11/05/19 271 lb (122.9 kg)  04/30/19 276 lb (125.2 kg)    BP 130/80   Pulse 98   Ht $R'5\' 5"'rc$  (1.651 m)   Wt 279 lb (126.6 kg)   BMI 46.43 kg/m  Assessment and Plan: 1. Type 2 diabetes mellitus with microalbuminuria, unspecified whether long term insulin use (HCC) Chronic.  Questionably controlled.  Patient does not check around blood sugars.  We will obtain an A1c to see what the current level is in the meantime we will asked the patient to continue her glipizide 5 mg one in the morning and a half in the evening. - HgB A1c - glipiZIDE (GLUCOTROL) 5 MG tablet; TAKE 1 TABLET BY MOUTH IN THE MORNING AND 1/2 (ONE-HALF) TABLET IN THE EVENING  Dispense: 135 tablet; Refill: 1  2. Essential hypertension Chronic.  Controlled.  Stable.  Patient will continue regimen of clonidine 0.33 times a day Lasix 40 mg twice a day metoprolol XL 50 mg once a day maintain losartan 80 mg once a day.  Will check renal function panel for electrolytes and GFR. - Renal Function Panel - cloNIDine (CATAPRES) 0.3 MG tablet; TAKE ONE TABLET BY MOUTH 3 TIMES DAILY.  Dispense: 270 tablet; Refill: 1 - furosemide (LASIX) 40 MG tablet; Take 1 tablet (40 mg total) by mouth 2 (two) times daily.  Dispense: 180 tablet; Refill: 1 - metoprolol succinate (TOPROL-XL) 50 MG 24 hr tablet; Take 1 tablet (50 mg total) by mouth daily.  Dispense: 90 tablet; Refill: 1 - telmisartan (MICARDIS) 80 MG tablet; TAKE ONE TABLET BY MOUTH EVERY DAY  Dispense: 90 tablet; Refill: 1  3. Familial hypercholesterolemia Chronic.  Controlled.  Stable.  Continue atorvastatin 10 mg once a day and recheck lipid panel. - atorvastatin (LIPITOR) 10 MG tablet;  Take 1 tablet (10 mg total) by mouth daily.  Dispense: 90 tablet; Refill: 1 - Lipid Panel With LDL/HDL Ratio  4. Hypothyroidism, unspecified type Chronic.  Controlled.  Stable.  Continue levothyroxine 125 pending thyroid panel  with TSH in appropriate range. - Thyroid Panel With TSH - levothyroxine (SYNTHROID) 125 MCG tablet; Take 1 tablet (125 mcg total) by mouth daily before breakfast.  Dispense: 90 tablet; Refill: 1  5. Edema, unspecified type Chronic.  Controlled.  Stable.  Patient is unable to wear compression stockings for various reasons so we will continue Lasix 40 mg twice a day. - furosemide (LASIX) 40 MG tablet; Take 1 tablet (40 mg total) by mouth 2 (two) times daily.  Dispense: 180 tablet; Refill: 1  6. Morbid obesity (HCC) Chronic.  Likely never to be addressed patient continues to eat questionably and has gained 8 pounds since last visit.  We will get a clue about controlled with the next A1c as well as rechecking patient pending her A1c.  7. Noncompliance There are multiple areas of concern including her diet her weight gain refuses to go for diabetic eye exam until Covid start but she will get her Covid vaccination for myriad of reasons.  All I can do is really encourage her.Marland KitchenMarland Kitchen

## 2020-05-01 ENCOUNTER — Other Ambulatory Visit: Payer: Self-pay

## 2020-05-01 DIAGNOSIS — R809 Proteinuria, unspecified: Secondary | ICD-10-CM

## 2020-05-01 DIAGNOSIS — E1129 Type 2 diabetes mellitus with other diabetic kidney complication: Secondary | ICD-10-CM

## 2020-05-01 LAB — RENAL FUNCTION PANEL
Albumin: 4.1 g/dL (ref 3.8–4.8)
BUN/Creatinine Ratio: 18 (ref 12–28)
BUN: 28 mg/dL — ABNORMAL HIGH (ref 8–27)
CO2: 21 mmol/L (ref 20–29)
Calcium: 9.6 mg/dL (ref 8.7–10.3)
Chloride: 97 mmol/L (ref 96–106)
Creatinine, Ser: 1.58 mg/dL — ABNORMAL HIGH (ref 0.57–1.00)
GFR calc Af Amer: 39 mL/min/{1.73_m2} — ABNORMAL LOW (ref >59)
GFR calc non Af Amer: 34 mL/min/{1.73_m2} — ABNORMAL LOW (ref >59)
Glucose: 147 mg/dL — ABNORMAL HIGH (ref 65–99)
Phosphorus: 2.9 mg/dL — ABNORMAL LOW (ref 3.0–4.3)
Potassium: 5 mmol/L (ref 3.5–5.2)
Sodium: 136 mmol/L (ref 134–144)

## 2020-05-01 LAB — LIPID PANEL WITH LDL/HDL RATIO
Cholesterol, Total: 205 mg/dL — ABNORMAL HIGH (ref 100–199)
HDL: 49 mg/dL (ref >39)
LDL Chol Calc (NIH): 136 mg/dL — ABNORMAL HIGH (ref 0–99)
LDL/HDL Ratio: 2.8 ratio (ref 0.0–3.2)
Triglycerides: 112 mg/dL (ref 0–149)
VLDL Cholesterol Cal: 20 mg/dL (ref 5–40)

## 2020-05-01 LAB — HEMOGLOBIN A1C
Est. average glucose Bld gHb Est-mCnc: 189 mg/dL
Hgb A1c MFr Bld: 8.2 % — ABNORMAL HIGH (ref 4.8–5.6)

## 2020-05-01 LAB — THYROID PANEL WITH TSH
Free Thyroxine Index: 3.5 (ref 1.2–4.9)
T3 Uptake Ratio: 35 % (ref 24–39)
T4, Total: 10 ug/dL (ref 4.5–12.0)
TSH: 0.511 u[IU]/mL (ref 0.450–4.500)

## 2020-05-01 MED ORDER — GLIPIZIDE 5 MG PO TABS: ORAL_TABLET | ORAL | 1 refills | Status: DC

## 2020-05-01 MED ORDER — ATORVASTATIN CALCIUM 20 MG PO TABS: 20.0000 mg | ORAL_TABLET | Freq: Every day | ORAL | 1 refills | Status: DC

## 2020-05-01 NOTE — Addendum Note (Signed)
Addended by: Fredderick Severance on: 05/01/2020 08:51 AM   Modules accepted: Orders

## 2020-05-04 ENCOUNTER — Telehealth: Payer: Self-pay | Admitting: Family Medicine

## 2020-05-04 DIAGNOSIS — E1129 Type 2 diabetes mellitus with other diabetic kidney complication: Secondary | ICD-10-CM

## 2020-05-04 MED ORDER — GLIPIZIDE 5 MG PO TABS: ORAL_TABLET | ORAL | 0 refills | Status: DC

## 2020-05-04 NOTE — Telephone Encounter (Signed)
Requested Prescriptions  Pending Prescriptions Disp Refills  . glipiZIDE (GLUCOTROL) 5 MG tablet 180 tablet 0    Sig: TAKE 1 TABLET BY MOUTH twice a day     Endocrinology:  Diabetes - Sulfonylureas Failed - 05/04/2020  9:10 AM      Failed - HBA1C is between 0 and 7.9 and within 180 days    Hgb A1c MFr Bld  Date Value Ref Range Status  04/30/2020 8.2 (H) 4.8 - 5.6 % Final    Comment:             Prediabetes: 5.7 - 6.4          Diabetes: >6.4          Glycemic control for adults with diabetes: <7.0          Passed - Valid encounter within last 6 months    Recent Outpatient Visits          4 days ago Type 2 diabetes mellitus with microalbuminuria, unspecified whether long term insulin use ([place])   [facility] Juline Patch, MD   6 months ago Essential hypertension   [facility] Juline Patch, MD   1 year ago Type 2 diabetes mellitus with microalbuminuria, unspecified whether long term insulin use ([place])   [facility] Juline Patch, MD   1 year ago Essential hypertension   [facility] Juline Patch, MD   2 years ago Type 2 diabetes mellitus with microalbuminuria, unspecified whether long term insulin use [facility])   [facility] Juline Patch, MD

## 2020-05-04 NOTE — Telephone Encounter (Signed)
Pt is calling and she said dr Ronnald Ramp changed glipizide 5 mg to twice a day instead of 5 mg in morning and half pill in evenings.  Pt did not received enough medication she was only given 135 pills for 90 day supply instead of 180 pills   Pills for 90 day supply. [facility] [address]

## 2020-05-05 ENCOUNTER — Other Ambulatory Visit: Payer: Self-pay

## 2020-05-05 DIAGNOSIS — R809 Proteinuria, unspecified: Secondary | ICD-10-CM

## 2020-05-05 DIAGNOSIS — E1129 Type 2 diabetes mellitus with other diabetic kidney complication: Secondary | ICD-10-CM

## 2020-05-05 MED ORDER — GLIPIZIDE 5 MG PO TABS: ORAL_TABLET | ORAL | 0 refills | Status: DC

## 2020-05-05 NOTE — Telephone Encounter (Signed)
Resent glipizide #180 to [facility] [address]

## 2020-05-05 NOTE — Telephone Encounter (Signed)
Pharmacy called and needs the Rx resubmitted electronically, included new directions. Please advise

## 2020-05-12 ENCOUNTER — Telehealth: Payer: Self-pay

## 2020-05-12 NOTE — Progress Notes (Deleted)
Chronic Care Management Pharmacy  Name: [name]  MRN: [id number] DOB: Nov 20, 1955   Chief Complaint/ HPI  [name],  64 y.o. , female presents for their Initial CCM visit with the clinical pharmacist via telephone due to COVID-19 Pandemic.  PCP : Juline Patch, MD Patient Care Team: Juline Patch, MD as PCP - General (Family Medicine) Ubaldo Glassing Javier Docker, MD as Consulting Physician (Cardiology) Vladimir Faster, [facility] (Pharmacist)  Their chronic conditions include: Hypertension, Hyperlipidemia, Diabetes, Chronic Kidney Disease, Hypothyroidism and Osteoarthritis   Office Visits: 11/05/19- Dr. Ronnald Ramp - bloodwork  Consult Visit: 05/30/19- Cardiology - SOB OE, echo recommended possible HF, determine LV function, ? Lymphedema, compression hose, suggest empagliflozin  No Known Allergies  Medications: Outpatient Encounter Medications as of 05/12/2020  Medication Sig  . acetaminophen (TYLENOL) 500 MG tablet Take 500 mg by mouth every 6 (six) hours as needed.  Marland Kitchen aspirin EC 81 MG tablet Take 1 tablet (81 mg total) by mouth daily.  Marland Kitchen atorvastatin (LIPITOR) 20 MG tablet Take 1 tablet (20 mg total) by mouth daily.  . cloNIDine (CATAPRES) 0.3 MG tablet TAKE ONE TABLET BY MOUTH 3 TIMES DAILY.  . furosemide (LASIX) 40 MG tablet Take 1 tablet (40 mg total) by mouth 2 (two) times daily.  Marland Kitchen glipiZIDE (GLUCOTROL) 5 MG tablet TAKE 1 TABLET BY MOUTH twice a day  . glucose blood test strip Use as instructed  . levothyroxine (SYNTHROID) 125 MCG tablet Take 1 tablet (125 mcg total) by mouth daily before breakfast.  . metoprolol succinate (TOPROL-XL) 50 MG 24 hr tablet Take 1 tablet (50 mg total) by mouth daily.  Marland Kitchen telmisartan (MICARDIS) 80 MG tablet TAKE ONE TABLET BY MOUTH EVERY DAY   No facility-administered encounter medications on file as of 05/12/2020.     Current Diagnosis/Assessment:    Goals Addressed   None       Diabetes   A1c goal <7%  Recent Relevant  Labs: Lab Results  Component Value Date/Time   HGBA1C 8.2 (H) 04/30/2020 02:08 PM   HGBA1C 7.4 (H) 11/05/2019 03:44 PM   CMP Latest Ref Rng & Units 04/30/2020 11/05/2019 04/30/2019  Glucose 65 - 99 mg/dL 147(H) 106(H) 128(H)  BUN 8 - 27 mg/dL 28(H) 25 21  Creatinine 0.57 - 1.00 mg/dL 1.58(H) 1.53(H) 1.43(H)  Sodium 134 - 144 mmol/L 136 137 137  Potassium 3.5 - 5.2 mmol/L 5.0 4.8 4.4  Chloride 96 - 106 mmol/L 97 102 101  CO2 20 - 29 mmol/L 21 24 19(L)  Calcium 8.7 - 10.3 mg/dL 9.6 9.2 9.6  Total Protein 6.0 - 8.5 g/dL - 7.8 -  Total Bilirubin 0.0 - 1.2 mg/dL - 0.7 -  Alkaline Phos 39 - 117 IU/L - 88 -  AST 0 - 40 IU/L - 15 -  ALT 0 - 32 IU/L - 9 -   EGFR~33 mL/min  Last diabetic Eye exam: No results found for: HMDIABEYEEXA  Last diabetic Foot exam: No results found for: HMDIABFOOTEX   Checking BG: Daily usually before breakfast Sahe does not check post-prandial Recent FBG Readings: 133 today. Reports her readings are usually in the 130's  She reports episodes of hypoglycemia with most recent 2-3 weeks ago. She states she became "shaky and hungry". Her BG at that time was 89.  Patient is currently uncontrolled on the following medications: Marland Kitchen Glipizide 5 mg qam, 2.5 mg qpm  We discussed: diet and exercise extensively and signs/symptoms and treatment of hypoglycemia. Patients endorsed eating  too much bread. She does not cook and often eats packaged [employer] or [employer] or sandwiches. She reports eating more sandwiches than usual because tomatoes are in season and she loves tomato sandwiches. She does read labels and tries to purchase items with the least sodium. She drinks two 16-ounce diet, caffeine-free Mountain Dews daily and equally as much water. We discussed the importance of checking post-prandials to help guide future therapy.  We discussed the upward trend in her W8G and complications associated with uncontrolled diabetes. She is very concerned about her kidney  function and her heart. She does currently exercise due to back pain which causes her walk hunched over. She uses a walker at baseline.Patient would benefit from transition to Empagliflozin. We discussed the benefits and risks including weight loss, minimal hypoglycemia, reduced risk cardiovascular death as well as increased UTI and yeast infections.  Patient does not want to go on any type of injectable therapy including insulin.  Plan  Recommend follow-up HbA1c 3-6 months from last reading given result above goal and upward trend. Consider changing to Empagliflozin at next appointment if eGFR still >30 ml/min.    Hypertension/CKD IV/EDEMA   BP goal is:  <130/80  Office blood pressures are  BP Readings from Last 3 Encounters:  04/30/20 130/80  11/05/19 130/84  04/30/19 (!) 160/90   BMP Latest Ref Rng & Units 04/30/2020 11/05/2019 04/30/2019  Glucose 65 - 99 mg/dL 147(H) 106(H) 128(H)  BUN 8 - 27 mg/dL 28(H) 25 21  Creatinine 0.57 - 1.00 mg/dL 1.58(H) 1.53(H) 1.43(H)  BUN/Creat Ratio 12 - _0 Sodium 134 - 144 mmol/L 136 137 137  Potassium 3.5 - 5.2 mmol/L 5.0 4.8 4.4  Chloride 96 - 106 mmol/L 97 102 101  CO2 20 - 29 mmol/L 21 24 19(L)  Calcium 8.7 - 10.3 mg/dL 9.6 9.2 9.6    Patient checks BP at home infrequently Patient states her monitor needs batteries and can not remember the last time she checked it at home.  Patient has failed these meds in the past: lopressor 25 mg Patient is currently controlled on the following medications:   Clonidine 0.3 tid  Furosemide 40 mg bid  Metoprolol succinate 50 mg bid  Telmisartan 80 mg qd  We discussed diet and exercise extensively. Patient does not cook and eats a lot of packaged meals. She has not followed up with cardiology or scheduled echocardiogram. She wears Juxtalite compression wraps and sits in recliner with legs elevated most of the time. Does not have scales at home. BP monitor needs batteries. She will get batteries  and begin checking BP.  Plan  Continue current medications.  Recommend patient follow up with cardiology for echol.    Hyperlipidemia   LDL goal < 70  Lipid Panel     Component Value Date/Time   CHOL 205 (H) 04/30/2020 1408   TRIG 112 04/30/2020 1408   HDL 49 04/30/2020 1408   LDLCALC 136 (H) 04/30/2020 1408    Hepatic Function Latest Ref Rng & Units 04/30/2020 11/05/2019 04/30/2019  Total Protein 6.0 - 8.5 g/dL - 7.8 -  Albumin 3.8 - 4.8 g/dL 4.1 4.0 4.0  AST 0 - 40 IU/L - 15 -  ALT 0 - 32 IU/L - 9 -  Alk Phosphatase 39 - 117 IU/L - 88 -  Total Bilirubin 0.0 - 1.2 mg/dL - 0.7 -     The 10-year ASCVD risk score Mikey Bussing DC Jr., et al., 2013) is:  16.7%   Values used to calculate the score:     Age: 27 years     Sex: Female     Is Non-Hispanic African American: No     Diabetic: Yes     Tobacco smoker: No     Systolic Blood Pressure: 456 mmHg     Is BP treated: Yes     HDL Cholesterol: 49 mg/dL     Total Cholesterol: 205 mg/dL   Patient has failed these meds in past: N/A Patient is currently uncontrolled on the following medications:  . Atorvastatin 10 mg qam . Aspirin 81 mg qam  We discussed:  diet and exercise extensively.  We discussed ASCVD risk calculation and LDL goals in patients with diabetes. Patient has limited mobility due to back pain. Uses a walker.  Plan  Recommend consider increasing atorvastatin dose to 20 mg to achieve goal ldl <70.  Hypothyroidism   Lab Results  Component Value Date/Time   TSH 0.511 04/30/2020 02:08 PM   TSH 0.258 (L) 11/05/2019 03:44 PM    Patient has failed these meds in past: none Patient is currently uncontrolled on the following medications:  . Levothyroxine 125 mcg qam  We discussed:  Taking first thing each morning 30 minutes before food or other medications. Patient denies any heart palpitations, difficulty falling asleep or excessive sweating.  Plan  Recommend consider further decrease of levothyroxine to 112 mcg  and recheck TSH in 6-8 weeks.  Vaccines   Reviewed and discussed patient's vaccination history.    Immunization History  Administered Date(s) Administered  . Tdap 05/03/2018    Plan  Patient is comtemplating COVID vaccine and is not interested in Shingrix or flu vaccine at this time.   Medication Management   Pt uses [facility] pharmacy for all medications Pt endorses 90-100% compliance  We discussed: Patient has prescriptions delivered and likes using [facility] because it is close to her house.   Plan  Continue current medication management strategy    Follow up: 3 month phone visit  Junita Push. Kenton Kingfisher PharmD, [facility] [phone]

## 2020-09-11 ENCOUNTER — Telehealth: Payer: Self-pay | Admitting: Pharmacist

## 2020-09-11 NOTE — Chronic Care Management (AMB) (Cosign Needed)
° ° °  Chronic Care Management Pharmacy Assistant   Name: [name]  MRN: [id number] DOB: October 13, 1955  Reason for Encounter: Disease State/General Adherence Call  Recent office visits:  04/30/2020 OV PCP Dr. Ronnald Ramp; no medication changes indicated.   05/04/2020 TE; states patient said Dr. Ronnald Ramp changed glipizide 5 mg to twice a day instead of 5 mg in morning and half pill in evenings, patient requested new Rx to be sent in for #180 instead of #135. New Rx sent  Recent consult visits:  [facility] visits:  None in previous 6 months  Medications: Outpatient Encounter Medications as of 09/11/2020  Medication Sig   acetaminophen (TYLENOL) 500 MG tablet Take 500 mg by mouth every 6 (six) hours as needed.   aspirin EC 81 MG tablet Take 1 tablet (81 mg total) by mouth daily.   atorvastatin (LIPITOR) 20 MG tablet Take 1 tablet (20 mg total) by mouth daily.   cloNIDine (CATAPRES) 0.3 MG tablet TAKE ONE TABLET BY MOUTH 3 TIMES DAILY.   furosemide (LASIX) 40 MG tablet Take 1 tablet (40 mg total) by mouth 2 (two) times daily.   glipiZIDE (GLUCOTROL) 5 MG tablet TAKE 1 TABLET BY MOUTH twice a day   glucose blood test strip Use as instructed   levothyroxine (SYNTHROID) 125 MCG tablet Take 1 tablet (125 mcg total) by mouth daily before breakfast.   metoprolol succinate (TOPROL-XL) 50 MG 24 hr tablet Take 1 tablet (50 mg total) by mouth daily.   telmisartan (MICARDIS) 80 MG tablet TAKE ONE TABLET BY MOUTH EVERY DAY   No facility-administered encounter medications on file as of 09/11/2020.     Have you had any problems recently with your health?  Have you had any problems with your pharmacy?  What issues or side effects are you having with your medications?  What would you like me to pass along to [name] CPP for her to help you with?   What can we do to take care of you better?   Star Rating Drugs: Atorvastatin 20 mg last filled 07/23/2020 90 DS Telmisartan 80 mg last filled  07/23/2020 90 DS Glipizide 5 mg last filled 07/23/2020 90 DS  No future appointments.   *Unsuccessful attempt to reach patient to complete general adherence call. Chart review completed.  April D Calhoun, [place] [facility] Pharmacist Assistant [phone]

## 2020-09-14 ENCOUNTER — Telehealth: Payer: Self-pay | Admitting: Family Medicine

## 2020-09-14 NOTE — Telephone Encounter (Signed)
Pt needs to attend jury duty, unfortunately walking with a walker doesn't excuse her. If she sees ortho- they might can write her out

## 2020-09-14 NOTE — Telephone Encounter (Addendum)
Pt would like to know if Dr Ronnald Ramp will write her a letter to excuse her from jury duty due to her health issues. Pt states she uses a walker to walk, does not drive.  Juror number [id number] Monday April 25  9 am File # [id number]  [place] ,[place]  Email: [email or web address]

## 2020-09-14 NOTE — Telephone Encounter (Signed)
Spoke to pt

## 2020-09-29 ENCOUNTER — Telehealth: Payer: Self-pay

## 2020-09-29 NOTE — Progress Notes (Signed)
    Chronic Care Management Pharmacy Assistant   Name: [name]  MRN: [id number] DOB: 1956/04/19  Reason for Encounter: Disease State- General Adherence Call  Recent office visits:  No recent visits   Recent consult visits:  No recent visits   Hospital visits:  None in previous 6 months  Medications: Outpatient Encounter Medications as of 09/29/2020  Medication Sig  . acetaminophen (TYLENOL) 500 MG tablet Take 500 mg by mouth every 6 (six) hours as needed.  Marland Kitchen aspirin EC 81 MG tablet Take 1 tablet (81 mg total) by mouth daily.  Marland Kitchen atorvastatin (LIPITOR) 20 MG tablet Take 1 tablet (20 mg total) by mouth daily.  . cloNIDine (CATAPRES) 0.3 MG tablet TAKE ONE TABLET BY MOUTH 3 TIMES DAILY.  . furosemide (LASIX) 40 MG tablet Take 1 tablet (40 mg total) by mouth 2 (two) times daily.  Marland Kitchen glipiZIDE (GLUCOTROL) 5 MG tablet TAKE 1 TABLET BY MOUTH twice a day  . glucose blood test strip Use as instructed  . levothyroxine (SYNTHROID) 125 MCG tablet Take 1 tablet (125 mcg total) by mouth daily before breakfast.  . metoprolol succinate (TOPROL-XL) 50 MG 24 hr tablet Take 1 tablet (50 mg total) by mouth daily.  Marland Kitchen telmisartan (MICARDIS) 80 MG tablet TAKE ONE TABLET BY MOUTH EVERY DAY   No facility-administered encounter medications on file as of 09/29/2020.   Have you had any problems recently with your health?  Have you had any problems with your pharmacy?  What issues or side effects are you having with your medications?  What would you like me to pass along to [facility] for them to help you with?   What can we do to take care of you better?  Star Rating Drugs: Atorvastatin '20mg'$ - 90DS last filled 07/23/2020 Telmisartan '80mg'$ - 90DS last filled 07/23/2020  Several unsuccessful attempts made to contact patient for general adherence call, CPP notified   Wilford Sports CPA, [place]

## 2020-10-01 ENCOUNTER — Telehealth: Payer: Self-pay | Admitting: Family Medicine

## 2020-10-01 ENCOUNTER — Telehealth: Payer: Self-pay

## 2020-10-01 NOTE — Telephone Encounter (Signed)
Requesting new glucose meter and test strips-prescription expired 04/2020.  Next OV 10/22/20  [place], [place].

## 2020-10-01 NOTE — Telephone Encounter (Signed)
Left RX in basket and called pt to pick up

## 2020-10-01 NOTE — Telephone Encounter (Unsigned)
Copied from [place] [phone]. Topic: General - Other >> Oct 01, 2020  2:29 PM Pawlus, Brayton Layman A wrote: Reason for CRM: Pt broke her glucose meter and wanted to know if Dr Ronnald Ramp would send her in a new glucose monitor along with test strips. Please advise. >> Oct 01, 2020  2:33 PM Pawlus, Brayton Layman A wrote: Pt requested monitor and test strips to be sent to:   [facility] [address] ([address]), [place] [address]  [place]) [place] [address]  Phone: [phone] Fax: [phone]

## 2020-10-01 NOTE — Telephone Encounter (Signed)
Medication: One touch Meter, glucose blood test strip [id number]   Has the patient contacted their pharmacy? YES (Agent: If no, request that the patient contact the pharmacy for the refill.) (Agent: If yes, when and what did the pharmacy advise?)  Preferred Pharmacy (with phone number or street name): [facility] (N), [place] [address] [place] [address] Phone: [phone] Fax: [phone] Hours: Not open 24 hours    Agent: Please be advised that RX refills may take up to 3 business days. We ask that you follow-up with your pharmacy.

## 2020-10-22 ENCOUNTER — Other Ambulatory Visit: Payer: Self-pay

## 2020-10-22 ENCOUNTER — Ambulatory Visit (INDEPENDENT_AMBULATORY_CARE_PROVIDER_SITE_OTHER): Payer: Medicare HMO | Admitting: Family Medicine

## 2020-10-22 ENCOUNTER — Encounter: Payer: Self-pay | Admitting: Family Medicine

## 2020-10-22 VITALS — BP 160/110 | HR 68 | Ht 65.0 in | Wt 274.0 lb

## 2020-10-22 DIAGNOSIS — E7801 Familial hypercholesterolemia: Secondary | ICD-10-CM | POA: Diagnosis not present

## 2020-10-22 DIAGNOSIS — E1129 Type 2 diabetes mellitus with other diabetic kidney complication: Secondary | ICD-10-CM

## 2020-10-22 DIAGNOSIS — I10 Essential (primary) hypertension: Secondary | ICD-10-CM

## 2020-10-22 DIAGNOSIS — R609 Edema, unspecified: Secondary | ICD-10-CM

## 2020-10-22 DIAGNOSIS — R809 Proteinuria, unspecified: Secondary | ICD-10-CM

## 2020-10-22 DIAGNOSIS — E039 Hypothyroidism, unspecified: Secondary | ICD-10-CM

## 2020-10-22 MED ORDER — ATORVASTATIN CALCIUM 20 MG PO TABS: 20.0000 mg | ORAL_TABLET | Freq: Every day | ORAL | 1 refills | Status: DC

## 2020-10-22 MED ORDER — FUROSEMIDE 40 MG PO TABS: 40.0000 mg | ORAL_TABLET | Freq: Two times a day (BID) | ORAL | 1 refills | Status: DC

## 2020-10-22 MED ORDER — METOPROLOL SUCCINATE ER 50 MG PO TB24: 50.0000 mg | ORAL_TABLET | Freq: Every day | ORAL | 1 refills | Status: DC

## 2020-10-22 MED ORDER — LEVOTHYROXINE SODIUM 125 MCG PO TABS: 125.0000 ug | ORAL_TABLET | Freq: Every day | ORAL | 1 refills | Status: DC

## 2020-10-22 MED ORDER — TELMISARTAN 80 MG PO TABS: ORAL_TABLET | ORAL | 1 refills | Status: DC

## 2020-10-22 MED ORDER — GLIPIZIDE 5 MG PO TABS: ORAL_TABLET | ORAL | 0 refills | Status: DC

## 2020-10-22 MED ORDER — CLONIDINE HCL 0.3 MG PO TABS: ORAL_TABLET | ORAL | 1 refills | Status: DC

## 2020-10-22 NOTE — Progress Notes (Signed)
Date:  10/22/2020   Name:  [name]   DOB:  07-03-1955   MRN:  [id number]   Chief Complaint: Hyperlipidemia, Hypertension, Diabetes, Hypothyroidism, and Edema  Hyperlipidemia This is a chronic problem. The current episode started more than 1 year ago. The problem is controlled. Recent lipid tests were reviewed and are normal. Pertinent negatives include no chest pain, myalgias or shortness of breath. Current antihyperlipidemic treatment includes statins. The current treatment provides moderate improvement of lipids.  Hypertension This is a chronic problem. The current episode started more than 1 year ago. The problem has been waxing and waning since onset. The problem is uncontrolled. Pertinent negatives include no anxiety, blurred vision, chest pain, headaches, malaise/fatigue, neck pain, orthopnea, palpitations, peripheral edema, PND, shortness of breath or sweats. Risk factors for coronary artery disease include diabetes mellitus and dyslipidemia. Past treatments include central alpha agonists, calcium channel blockers, beta blockers and diuretics. The current treatment provides moderate improvement. There are no compliance problems.  Identifiable causes of hypertension include a thyroid problem.  Diabetes She presents for her follow-up diabetic visit. She has type 2 diabetes mellitus. Her disease course has been stable. Pertinent negatives for hypoglycemia include no confusion, dizziness, headaches, nervousness/anxiousness, sweats or tremors. Pertinent negatives for diabetes include no blurred vision, no chest pain, no fatigue, no foot paresthesias, no foot ulcerations, no polydipsia, no polyphagia, no polyuria, no visual change, no weakness and no weight loss. Symptoms are stable. There are no diabetic complications. There are no known risk factors for coronary artery disease. Current diabetic treatment includes oral agent (monotherapy). Her weight is stable. She is following a generally  unhealthy diet. Meal planning includes avoidance of concentrated sweets and carbohydrate counting. Home blood sugar record trend: still not taking at home.  Thyroid Problem Presents for follow-up visit. Patient reports no anxiety, cold intolerance, constipation, depressed mood, diaphoresis, diarrhea, dry skin, fatigue, heat intolerance, hoarse voice, menstrual problem, palpitations, tremors, visual change or weight loss. Her past medical history is significant for hyperlipidemia.    Lab Results  Component Value Date   CREATININE 1.58 (H) 04/30/2020   BUN 28 (H) 04/30/2020   NA 136 04/30/2020   K 5.0 04/30/2020   CL 97 04/30/2020   CO2 21 04/30/2020   Lab Results  Component Value Date   CHOL 205 (H) 04/30/2020   HDL 49 04/30/2020   LDLCALC 136 (H) 04/30/2020   TRIG 112 04/30/2020   CHOLHDL 4.4 05/01/2017   Lab Results  Component Value Date   TSH 0.511 04/30/2020   Lab Results  Component Value Date   HGBA1C 8.2 (H) 04/30/2020   Lab Results  Component Value Date   WBC 8.5 05/31/2015   HGB 8.4 (L) 05/31/2015   HCT 25.8 (L) 05/31/2015   MCV 88.5 05/31/2015   PLT 186 05/31/2015   Lab Results  Component Value Date   ALT 9 11/05/2019   AST 15 11/05/2019   ALKPHOS 88 11/05/2019   BILITOT 0.7 11/05/2019     Review of Systems  Constitutional: Negative.  Negative for chills, diaphoresis, fatigue, fever, malaise/fatigue, unexpected weight change and weight loss.  HENT: Negative for congestion, ear discharge, ear pain, hoarse voice, rhinorrhea, sinus pressure, sneezing and sore throat.   Eyes: Negative for blurred vision, photophobia, pain, discharge, redness and itching.  Respiratory: Negative for cough, shortness of breath, wheezing and stridor.   Cardiovascular: Negative for chest pain, palpitations, orthopnea and PND.  Gastrointestinal: Negative for abdominal pain, blood in stool, constipation,  diarrhea, nausea and vomiting.  Endocrine: Negative for cold intolerance, heat  intolerance, polydipsia, polyphagia and polyuria.  Genitourinary: Negative for dysuria, flank pain, frequency, hematuria, menstrual problem, pelvic pain, urgency, vaginal bleeding and vaginal discharge.  Musculoskeletal: Negative for arthralgias, back pain, myalgias and neck pain.  Skin: Negative for rash.  Allergic/Immunologic: Negative for environmental allergies and food allergies.  Neurological: Negative for dizziness, tremors, weakness, light-headedness, numbness and headaches.  Hematological: Negative for adenopathy. Does not bruise/bleed easily.  Psychiatric/Behavioral: Negative for confusion and dysphoric mood. The patient is not nervous/anxious.     Patient Active Problem List   Diagnosis Date Noted  . Morbid obesity ([place]) 10/24/2017  . Chronic kidney disease, stage IV (severe) ([place]) 10/24/2017  . Hypothyroidism 11/15/2016  . Edema 11/15/2016  . Type 2 diabetes mellitus with microalbuminuria, without long-term current use of insulin ([place]) 11/15/2016  . Essential hypertension 12/09/2015  . 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 Pressure ulcer 05/31/2015  . Primary osteoarthritis of right hip 05/28/2015    No Known Allergies  Past Surgical History:  Procedure Laterality Date  . TOTAL HIP ARTHROPLASTY Right 05/28/2015   Procedure: TOTAL HIP ARTHROPLASTY ANTERIOR APPROACH;  Surgeon: Hessie Knows, MD;  Location: [facility] ORS;  Service: Orthopedics;  Laterality: Right;    Social History   Tobacco Use  . Smoking status: Never Smoker  . Smokeless tobacco: Never Used  Substance Use Topics  . Alcohol use: No  . Drug use: No     Medication list has been reviewed and updated.  Current Meds  Medication Sig  . acetaminophen (TYLENOL) 500 MG tablet Take 500 mg by mouth every 6 (six) hours as needed.  Marland Kitchen aspirin EC 81 MG tablet Take 1 tablet (81 mg total) by mouth daily.  Marland Kitchen atorvastatin (LIPITOR) 20 MG tablet Take 1 tablet  (20 mg total) by mouth daily.  . cloNIDine (CATAPRES) 0.3 MG tablet TAKE ONE TABLET BY MOUTH 3 TIMES DAILY.  . furosemide (LASIX) 40 MG tablet Take 1 tablet (40 mg total) by mouth 2 (two) times daily.  Marland Kitchen glipiZIDE (GLUCOTROL) 5 MG tablet TAKE 1 TABLET BY MOUTH twice a day  . glucose blood test strip Use as instructed  . levothyroxine (SYNTHROID) 125 MCG tablet Take 1 tablet (125 mcg total) by mouth daily before breakfast.  . metoprolol succinate (TOPROL-XL) 50 MG 24 hr tablet Take 1 tablet (50 mg total) by mouth daily.  Marland Kitchen telmisartan (MICARDIS) 80 MG tablet TAKE ONE TABLET BY MOUTH EVERY DAY    PHQ 2/9 Scores 10/22/2020 04/30/2020 11/05/2019 11/04/2019  PHQ - 2 Score 0 0 0 0  PHQ- 9 Score 0 0 0 -    GAD 7 : Generalized Anxiety Score 10/22/2020 04/30/2020 11/05/2019  Nervous, Anxious, on Edge 0 0 0  Control/stop worrying 0 0 0  Worry too much - different things 0 0 0  Trouble relaxing 0 0 0  Restless 0 0 0  Easily annoyed or irritable 0 0 0  Afraid - awful might happen 0 0 0  Total GAD 7 Score 0 0 0    BP Readings from Last 3 Encounters:  10/22/20 (!) 160/110  04/30/20 130/80  11/05/19 130/84    Physical Exam Vitals and nursing note reviewed.  Constitutional:      Appearance: She is well-developed.  HENT:     Head: Normocephalic.     Right Ear: External ear normal.     Left Ear: External ear normal.  Eyes:     General: Lids are everted, no foreign bodies appreciated. No scleral icterus.       Left eye: No foreign body or hordeolum.     Conjunctiva/sclera: Conjunctivae normal.     Right eye: Right conjunctiva is not injected.     Left eye: Left conjunctiva is not injected.     Pupils: Pupils are equal, round, and reactive to light.  Neck:     Thyroid: No thyromegaly.     Vascular: No JVD.     Trachea: No tracheal deviation.  Cardiovascular:     Rate and Rhythm: Normal rate and regular rhythm.     Heart sounds: Normal heart sounds, S1 normal and S2 normal. No murmur  heard.  No systolic murmur is present.  No diastolic murmur is present. No friction rub. No gallop. No S3 or S4 sounds.   Pulmonary:     Effort: Pulmonary effort is normal. No respiratory distress.     Breath sounds: Normal breath sounds. No decreased breath sounds, wheezing, rhonchi or rales.  Abdominal:     General: Bowel sounds are normal.     Palpations: Abdomen is soft. There is no mass.     Tenderness: There is no abdominal tenderness. There is no guarding or rebound.  Musculoskeletal:        General: No tenderness. Normal range of motion.     Cervical back: Normal range of motion and neck supple.  Lymphadenopathy:     Cervical: No cervical adenopathy.  Skin:    General: Skin is warm.     Findings: No rash.  Neurological:     Mental Status: She is alert and oriented to person, place, and time.     Cranial Nerves: No cranial nerve deficit.     Deep Tendon Reflexes: Reflexes normal.  Psychiatric:        Mood and Affect: Mood is not anxious or depressed.     Wt Readings from Last 3 Encounters:  10/22/20 274 lb (124.3 kg)  04/30/20 279 lb (126.6 kg)  11/05/19 271 lb (122.9 kg)    BP (!) 160/110   Pulse 68   Ht '5\' 5"'$  (1.651 m)   Wt 274 lb (124.3 kg)   BMI 45.60 kg/m   Assessment and Plan:  1. Essential hypertension Chronic.  Uncontrolled.  Patient did not take her Lasix today prior to coming because of some reason she has.  Currently she is to continue her clonidine 0.33 times a day Lasix 40 mg twice a day metoprolol XL 50 mg once a day Micardis 80 mg every other day.  We will check a renal function panel and evaluate electrolytes and creatinine. - Renal Function Panel - cloNIDine (CATAPRES) 0.3 MG tablet; TAKE ONE TABLET BY MOUTH 3 TIMES DAILY.  Dispense: 270 tablet; Refill: 1 - furosemide (LASIX) 40 MG tablet; Take 1 tablet (40 mg total) by mouth 2 (two) times daily.  Dispense: 180 tablet; Refill: 1 - metoprolol succinate (TOPROL-XL) 50 MG 24 hr tablet; Take 1  tablet (50 mg total) by mouth daily.  Dispense: 90 tablet; Refill: 1 - telmisartan (MICARDIS) 80 MG tablet; TAKE ONE TABLET BY MOUTH EVERY DAY  Dispense: 90 tablet; Refill: 1  2. Familial hypercholesterolemia Chronic.  Controlled.  Stable.  Continue atorvastatin 20 mg once a day we will check lipid panel for current level of control. - atorvastatin (LIPITOR) 20 MG tablet; Take 1 tablet (20 mg total) by mouth daily.  Dispense: 90 tablet; Refill: 1  3. Edema, unspecified type Chronic.  Controlled.  Stable.  We will continue with Lasix 40 mg 1 twice a day. - Renal Function Panel - furosemide (LASIX) 40 MG tablet; Take 1 tablet (40 mg total) by mouth 2 (two) times daily.  Dispense: 180 tablet; Refill: 1  4. Type 2 diabetes mellitus with microalbuminuria, unspecified whether long term insulin use (HCC) Chronic.  Controlled.  Stable.  This is an ongoing issue that patient has not able to get a glucometer and to check some glucose readings to bring.  Patient is once again been told the importance of doing this and we will recheck an A1c in the meantime we will continue glipizide 5 mg 1 twice a day. - HgB A1c - glipiZIDE (GLUCOTROL) 5 MG tablet; TAKE 1 TABLET BY MOUTH twice a day  Dispense: 180 tablet; Refill: 0  5. Hypothyroidism, unspecified type Chronic.  Controlled.  Stable.  We will check thyroid function with TSH and thyroid panel and probably continue levothyroxine at 125 mcg daily. - Thyroid Panel With TSH - levothyroxine (SYNTHROID) 125 MCG tablet; Take 1 tablet (125 mcg total) by mouth daily before breakfast.  Dispense: 90 tablet; Refill: 1

## 2020-10-23 ENCOUNTER — Telehealth: Payer: Self-pay

## 2020-10-23 ENCOUNTER — Other Ambulatory Visit: Payer: Self-pay

## 2020-10-23 DIAGNOSIS — R809 Proteinuria, unspecified: Secondary | ICD-10-CM

## 2020-10-23 DIAGNOSIS — E039 Hypothyroidism, unspecified: Secondary | ICD-10-CM

## 2020-10-23 DIAGNOSIS — E1129 Type 2 diabetes mellitus with other diabetic kidney complication: Secondary | ICD-10-CM

## 2020-10-23 LAB — LIPID PANEL WITH LDL/HDL RATIO
Cholesterol, Total: 178 mg/dL (ref 100–199)
HDL: 45 mg/dL (ref >39)
LDL Chol Calc (NIH): 118 mg/dL — ABNORMAL HIGH (ref 0–99)
LDL/HDL Ratio: 2.6 ratio (ref 0.0–3.2)
Triglycerides: 82 mg/dL (ref 0–149)
VLDL Cholesterol Cal: 15 mg/dL (ref 5–40)

## 2020-10-23 LAB — RENAL FUNCTION PANEL
Albumin: 4 g/dL (ref 3.8–4.8)
BUN/Creatinine Ratio: 17 (ref 12–28)
BUN: 27 mg/dL (ref 8–27)
CO2: 17 mmol/L — ABNORMAL LOW (ref 20–29)
Calcium: 9.7 mg/dL (ref 8.7–10.3)
Chloride: 103 mmol/L (ref 96–106)
Creatinine, Ser: 1.6 mg/dL — ABNORMAL HIGH (ref 0.57–1.00)
Glucose: 105 mg/dL — ABNORMAL HIGH (ref 65–99)
Phosphorus: 2.9 mg/dL — ABNORMAL LOW (ref 3.0–4.3)
Potassium: 5.7 mmol/L — ABNORMAL HIGH (ref 3.5–5.2)
Sodium: 137 mmol/L (ref 134–144)
eGFR: 35 mL/min/{1.73_m2} — ABNORMAL LOW (ref >59)

## 2020-10-23 LAB — THYROID PANEL WITH TSH
Free Thyroxine Index: 3.4 (ref 1.2–4.9)
T3 Uptake Ratio: 34 % (ref 24–39)
T4, Total: 10 ug/dL (ref 4.5–12.0)
TSH: 0.372 u[IU]/mL — ABNORMAL LOW (ref 0.450–4.500)

## 2020-10-23 LAB — HEMOGLOBIN A1C
Est. average glucose Bld gHb Est-mCnc: 143 mg/dL
Hgb A1c MFr Bld: 6.6 % — ABNORMAL HIGH (ref 4.8–5.6)

## 2020-10-23 MED ORDER — LEVOTHYROXINE SODIUM 112 MCG PO TABS: 112.0000 ug | ORAL_TABLET | Freq: Every day | ORAL | 0 refills | Status: DC

## 2020-10-23 MED ORDER — GLUCOSE BLOOD VI STRP: 1.0000 | ORAL_STRIP | Freq: Every day | 3 refills | Status: DC

## 2020-10-23 MED ORDER — BLOOD GLUCOSE METER KIT
1.0000 | PACK | Freq: Every day | 0 refills | Status: AC
Start: 2020-10-23 — End: ?

## 2020-10-23 MED ORDER — BLOOD GLUCOSE METER KIT: PACK | 0 refills | Status: DC

## 2020-10-23 MED ORDER — ONETOUCH ULTRASOFT LANCETS MISC: 1.0000 | Freq: Every day | 3 refills | Status: AC

## 2020-10-23 NOTE — Telephone Encounter (Signed)
Patient is calling again to check the status of her rx request.  Pharmacy is waiting to hear from the doctor.  Please confirm and call patient to give an update.

## 2020-10-23 NOTE — Telephone Encounter (Signed)
Copied from [place] [phone]. Topic: General - Other >> Oct 23, 2020  2:40 PM Tessa Lerner A wrote: Reason for CRM: Courtney with [facility] has made contact regarding patient's previously submitted prescription for testing supplies  Three separate prescriptions are needed for the meter, strips and lancets respectively  The words "up to" and "as directed" cannot be used in the instructions. A specific and set frequency must be included  With the patient not being insulin dependent Medicare will only 1x a day testing  Please contact to further advise when possible >> Oct 23, 2020  2:45 PM Tessa Lerner A wrote: Medicare will only *cover 1x a day testing

## 2020-10-23 NOTE — Telephone Encounter (Signed)
Duplicate message.  Prescriptions sent to the pharmacy electronically.

## 2020-10-23 NOTE — Telephone Encounter (Unsigned)
Copied from [place] [phone]. Topic: General - Other >> Oct 23, 2020 11:56 AM Yvette Rack wrote: Reason for CRM: [facility] called to request that the Rx for the glucose meter, test strips and lancets is sent to them electronically.

## 2020-10-23 NOTE — Telephone Encounter (Signed)
Sent to [facility]

## 2020-10-23 NOTE — Telephone Encounter (Signed)
Prescriptions sent to the pharmacy electronically.

## 2020-10-23 NOTE — Telephone Encounter (Deleted)
Three separate prescriptions are needed for the meter, strips and lancets respectively  "up to" and "as directed" cannot be used in the instructions a specific and set frequency must be included  With the patient not being insulin dependent Medicare will only 1x a day testing

## 2020-10-23 NOTE — Telephone Encounter (Signed)
Copied from [place] [phone]. Topic: General - Other >> Oct 23, 2020  2:40 PM Tessa Lerner A wrote: Reason for CRM: Courtney with [facility] has made contact regarding patient's previously submitted prescription for testing supplies  Three separate prescriptions are needed for the meter, strips and lancets respectively  The words "up to" and "as directed" cannot be used in the instructions. A specific and set frequency must be included  With the patient not being insulin dependent Medicare will only 1x a day testing  Please contact to further advise when possible

## 2020-10-26 ENCOUNTER — Other Ambulatory Visit: Payer: Self-pay

## 2020-10-26 DIAGNOSIS — E1129 Type 2 diabetes mellitus with other diabetic kidney complication: Secondary | ICD-10-CM

## 2020-10-26 DIAGNOSIS — R809 Proteinuria, unspecified: Secondary | ICD-10-CM

## 2020-10-26 MED ORDER — GLUCOSE BLOOD VI STRP
1.0000 | ORAL_STRIP | Freq: Every day | 3 refills | Status: AC
Start: 2020-10-26 — End: ?

## 2020-10-28 ENCOUNTER — Telehealth: Payer: Self-pay

## 2020-10-28 NOTE — Telephone Encounter (Signed)
Copied from [place] [id number]. Topic: General - Other >> Oct 28, 2020  8:53 AM Keene Breath wrote: Reason for CRM: Patient stated she is returning a call to [place].  She is not exactly sure why she got the call, but would like a call back because her voice mail is not set up.  CB# [phone]

## 2020-10-28 NOTE — Telephone Encounter (Signed)
Called pt- she received message on glucometer

## 2020-10-29 ENCOUNTER — Telehealth: Payer: Self-pay

## 2020-10-29 ENCOUNTER — Telehealth: Payer: Self-pay | Admitting: Pharmacist

## 2020-10-29 NOTE — Progress Notes (Deleted)
Chronic Care Management Pharmacy Note  10/29/2020 Name:  [name] MRN:  [id number] DOB:  23-Aug-1955  Subjective: [name] is an 65 y.o. year old female who is a primary patient of Autumn Patch, MD.  The CCM team was consulted for assistance with disease management and care coordination needs.    Engaged with patient by telephone for follow up visit in response to provider referral for pharmacy case management and/or care coordination services.   Consent to Services:  The patient was given information about Chronic Care Management services, agreed to services, and gave verbal consent prior to initiation of services.  Please see initial visit note for detailed documentation.   Patient Care Team: Autumn Patch, MD as PCP - General (Family Medicine) Autumn Spray, MD as Consulting Physician (Cardiology) Vladimir Faster, [facility] (Pharmacist)  Recent office visits: 10/22/20- [name](PCP)- blood work, TSH 0.372 levo dose decreased to 112 mcg 04/30/21-Joens(PCP)- blood work, referral to endo and nephrology  Recent consult visits: [name] Hospital visits: None in previous 6 months  Objective:  Lab Results  Component Value Date   CREATININE 1.60 (H) 10/22/2020   BUN 27 10/22/2020   GFRNONAA 34 (L) 04/30/2020   GFRAA 39 (L) 04/30/2020   NA 137 10/22/2020   K 5.7 (H) 10/22/2020   CALCIUM 9.7 10/22/2020   CO2 17 (L) 10/22/2020   GLUCOSE 105 (H) 10/22/2020    Lab Results  Component Value Date/Time   HGBA1C 6.6 (H) 10/22/2020 02:50 PM   HGBA1C 8.2 (H) 04/30/2020 02:08 PM    Last diabetic Eye exam: No results found for: HMDIABEYEEXA  Last diabetic Foot exam: No results found for: HMDIABFOOTEX   Lab Results  Component Value Date   CHOL 178 10/22/2020   HDL 45 10/22/2020   LDLCALC 118 (H) 10/22/2020   TRIG 82 10/22/2020   CHOLHDL 4.4 05/01/2017    Hepatic Function Latest Ref Rng & Units 10/22/2020 04/30/2020 11/05/2019  Total Protein 6.0 - 8.5 g/dL - - 7.8   Albumin 3.8 - 4.8 g/dL 4.0 4.1 4.0  AST 0 - 40 IU/L - - 15  ALT 0 - 32 IU/L - - 9  Alk Phosphatase 39 - 117 IU/L - - 88  Total Bilirubin 0.0 - 1.2 mg/dL - - 0.7    Lab Results  Component Value Date/Time   TSH 0.372 (L) 10/22/2020 02:50 PM   TSH 0.511 04/30/2020 02:08 PM    CBC Latest Ref Rng & Units 05/31/2015 05/30/2015 05/28/2015  WBC 3.6 - 11.0 K/uL 8.5 - 6.8  Hemoglobin 12.0 - 16.0 g/dL 8.4(L) 8.7(L) 11.6(L)  Hematocrit 35.0 - 47.0 % 25.8(L) - 35.5  Platelets 150 - 440 K/uL 186 - 272    No results found for: VD25OH  Clinical ASCVD: No  The 10-year ASCVD risk score [name] DC Jr., et al., 2013) is: 25.8%   Values used to calculate the score:     Age: 63 years     Sex: Female     Is Non-Hispanic African American: No     Diabetic: Yes     Tobacco smoker: No     Systolic Blood Pressure: 010 mmHg     Is BP treated: Yes     HDL Cholesterol: 45 mg/dL     Total Cholesterol: 178 mg/dL    Depression screen [facility] 2/9 10/22/2020 04/30/2020 11/05/2019  Decreased Interest 0 0 0  Down, Depressed, Hopeless 0 0 0  PHQ - 2 Score 0 0 0  Altered sleeping 0 0 0  Tired, decreased energy 0 0 0  Change in appetite 0 0 0  Feeling bad or failure about yourself  0 0 0  Trouble concentrating 0 0 0  Moving slowly or fidgety/restless 0 0 0  Suicidal thoughts 0 0 0  PHQ-9 Score 0 0 0  Difficult doing work/chores - - -       Social History   Tobacco Use  Smoking Status Never Smoker  Smokeless Tobacco Never Used   BP Readings from Last 3 Encounters:  10/22/20 (!) 160/110  04/30/20 130/80  11/05/19 130/84   Pulse Readings from Last 3 Encounters:  10/22/20 68  04/30/20 98  11/05/19 88   Wt Readings from Last 3 Encounters:  10/22/20 274 lb (124.3 kg)  04/30/20 279 lb (126.6 kg)  11/05/19 271 lb (122.9 kg)   BMI Readings from Last 3 Encounters:  10/22/20 45.60 kg/m  04/30/20 46.43 kg/m  11/05/19 45.10 kg/m    Assessment/Interventions: Review of patient past medical history,  allergies, medications, health status, including review of consultants reports, laboratory and other test data, was performed as part of comprehensive evaluation and provision of chronic care management services.   SDOH:  (Social Determinants of Health) assessments and interventions performed: No  SDOH Screenings   Alcohol Screen: Low Risk   . Last Alcohol Screening Score (AUDIT): 0  Depression (PHQ2-9): Low Risk   . PHQ-2 Score: 0  Financial Resource Strain: Low Risk   . Difficulty of Paying Living Expenses: Not hard at all  Food Insecurity: No Food Insecurity  . Worried About Charity fundraiser in the Last Year: Never true  . Ran Out of Food in the Last Year: Never true  Housing: Low Risk   . Last Housing Risk Score: 0  Physical Activity: Inactive  . Days of Exercise per Week: 0 days  . Minutes of Exercise per Session: 0 min  Social Connections: Unknown  . Frequency of Communication with Friends and Family: Patient refused  . Frequency of Social Gatherings with Friends and Family: Patient refused  . Attends Religious Services: Patient refused  . Active Member of Clubs or Organizations: Patient refused  . Attends Archivist Meetings: Patient refused  . Marital Status: Patient refused  Stress: No Stress Concern Present  . Feeling of Stress : Not at all  Tobacco Use: Low Risk   . Smoking Tobacco Use: Never Smoker  . Smokeless Tobacco Use: Never Used  Transportation Needs: No Transportation Needs  . Lack of Transportation (Medical): No  . Lack of Transportation (Non-Medical): No     Immunization History  Administered Date(s) Administered  . Tdap 05/03/2018    Conditions to be addressed/monitored:  Hypertension, Hyperlipidemia, Diabetes, Chronic Kidney Disease and Hypothyroidism  There are no care plans that you recently modified to display for this patient.    Medication Assistance: {MEDASSISTANCEINFO:25044}  Patient's preferred pharmacy is:  [facility] [address] ([address]), [place]) [place] [address] Phone: [phone] Fax: [phone]  Uses pill box? {Yes or If no, why not?:20788} Pt endorses ***% compliance  We discussed: {Pharmacy options:24294} Patient decided to: {US Pharmacy Plan:23885}  Care Plan and Follow Up Patient Decision:  {FOLLOWUP:24991}  Plan: {CM FOLLOW UP EHUD:14970}  ***  Current Barriers:  . {pharmacybarriers:24917}  Pharmacist Clinical Goal(s):  Marland Kitchen Patient will {PHARMACYGOALCHOICES:24921} through collaboration with PharmD and provider.   Interventions: . 1:1 collaboration with [name]  C, MD regarding development and update of comprehensive plan of care as evidenced by provider attestation and co-signature . Inter-disciplinary care team collaboration (see longitudinal plan of care) . Comprehensive medication review performed; medication list updated in electronic medical record  Hypertension (BP goal {CHL HP UPSTREAM Pharmacist BP ranges:[phone]}) -{US controlled/uncontrolled:25276} -Current treatment: . *** -Medications previously tried: ***  -Current home readings: *** -Current dietary habits: *** -Current exercise habits: *** -{ACTIONS;DENIES/REPORTS:21021675::"Denies"} hypotensive/hypertensive symptoms -Educated on {CCM BP Counseling:25124} -Counseled to monitor BP at home ***, document, and provide log at future appointments -{CCMPHARMDINTERVENTION:25122}  Hyperlipidemia: (LDL goal < ***) -{US controlled/uncontrolled:25276} -Current treatment: . *** -Medications previously tried: ***  -Current dietary patterns: *** -Current exercise habits: *** -Educated on {CCM HLD Counseling:25126} -{CCMPHARMDINTERVENTION:25122}  Diabetes (A1c goal {A1c goals:23924}) -{US controlled/uncontrolled:25276} -Current medications: . *** -Medications previously tried: ***  -Current home glucose readings . fasting glucose:  *** . post prandial glucose: *** -{ACTIONS;DENIES/REPORTS:21021675::"Denies"} hypoglycemic/hyperglycemic symptoms -Current meal patterns:  . breakfast: ***  . lunch: ***  . dinner: *** . snacks: *** . drinks: *** -Current exercise: *** -Educated on {CCM DM COUNSELING:25123} -Counseled to check feet daily and get yearly eye exams -{CCMPHARMDINTERVENTION:25122}  *** (Goal: ***) -{US controlled/uncontrolled:25276} -Current treatment  . *** -Medications previously tried: ***  -{CCMPHARMDINTERVENTION:25122}  Health Maintenance -Vaccine gaps: *** -Current therapy:  . *** -Educated on {ccm supplement counseling:25128} -{CCM Patient satisfied:25129} -{CCMPHARMDINTERVENTION:25122}   Patient Goals/Self-Care Activities . Patient will:  - {pharmacypatientgoals:24919}  Follow Up Plan: {CM FOLLOW UP FBPZ:02585}

## 2020-11-02 ENCOUNTER — Telehealth: Payer: Self-pay | Admitting: Family Medicine

## 2020-11-02 NOTE — Telephone Encounter (Signed)
Copied from [place] [id number]. Topic: Medicare AWV >> Nov 02, 2020 11:07 AM Cher Nakai R wrote: Reason for CRM:  No answer unable to leave a message for patient to call back and schedule Medicare Annual Wellness Visit (AWV) in office.   If unable to come into the office for AWV,  please offer to do virtually or by telephone.  Last AWV: 11/04/2019  Please schedule at anytime with [facility] Health Advisor.  40 minute appointment  Any questions, please contact me at [phone]

## 2020-11-19 ENCOUNTER — Ambulatory Visit: Payer: Medicare HMO | Admitting: Family Medicine

## 2020-12-14 ENCOUNTER — Other Ambulatory Visit: Payer: Self-pay

## 2020-12-14 ENCOUNTER — Other Ambulatory Visit: Payer: Medicare HMO

## 2020-12-14 DIAGNOSIS — E039 Hypothyroidism, unspecified: Secondary | ICD-10-CM

## 2020-12-15 ENCOUNTER — Other Ambulatory Visit: Payer: Self-pay

## 2020-12-15 DIAGNOSIS — E039 Hypothyroidism, unspecified: Secondary | ICD-10-CM

## 2020-12-15 DIAGNOSIS — E1129 Type 2 diabetes mellitus with other diabetic kidney complication: Secondary | ICD-10-CM

## 2020-12-15 LAB — TSH: TSH: 1.22 u[IU]/mL (ref 0.450–4.500)

## 2020-12-15 MED ORDER — GLIPIZIDE 5 MG PO TABS: ORAL_TABLET | ORAL | 0 refills | Status: DC

## 2020-12-15 MED ORDER — LEVOTHYROXINE SODIUM 112 MCG PO TABS: 112.0000 ug | ORAL_TABLET | Freq: Every day | ORAL | 0 refills | Status: DC

## 2021-02-14 ENCOUNTER — Emergency Department
Admission: EM | Admit: 2021-02-14 | Discharge: 2021-02-15 | Disposition: A | Discharge status: Home / Self Care | Payer: Medicare HMO | Attending: Emergency Medicine | Admitting: Emergency Medicine

## 2021-02-14 ENCOUNTER — Other Ambulatory Visit: Payer: Self-pay

## 2021-02-14 ENCOUNTER — Emergency Department: Payer: Medicare HMO

## 2021-02-14 DIAGNOSIS — E039 Hypothyroidism, unspecified: Secondary | ICD-10-CM | POA: Insufficient documentation

## 2021-02-14 DIAGNOSIS — Z7984 Long term (current) use of oral hypoglycemic drugs: Secondary | ICD-10-CM | POA: Diagnosis not present

## 2021-02-14 DIAGNOSIS — Z96641 Presence of right artificial hip joint: Secondary | ICD-10-CM | POA: Insufficient documentation

## 2021-02-14 DIAGNOSIS — Z743 Need for continuous supervision: Secondary | ICD-10-CM | POA: Diagnosis not present

## 2021-02-14 DIAGNOSIS — M79671 Pain in right foot: Secondary | ICD-10-CM | POA: Insufficient documentation

## 2021-02-14 DIAGNOSIS — N184 Chronic kidney disease, stage 4 (severe): Secondary | ICD-10-CM | POA: Diagnosis not present

## 2021-02-14 DIAGNOSIS — I129 Hypertensive chronic kidney disease with stage 1 through stage 4 chronic kidney disease, or unspecified chronic kidney disease: Secondary | ICD-10-CM | POA: Insufficient documentation

## 2021-02-14 DIAGNOSIS — Z7982 Long term (current) use of aspirin: Secondary | ICD-10-CM | POA: Diagnosis not present

## 2021-02-14 DIAGNOSIS — M25571 Pain in right ankle and joints of right foot: Secondary | ICD-10-CM | POA: Diagnosis not present

## 2021-02-14 DIAGNOSIS — Z79899 Other long term (current) drug therapy: Secondary | ICD-10-CM | POA: Insufficient documentation

## 2021-02-14 DIAGNOSIS — R52 Pain, unspecified: Secondary | ICD-10-CM | POA: Diagnosis not present

## 2021-02-14 DIAGNOSIS — I10 Essential (primary) hypertension: Secondary | ICD-10-CM | POA: Diagnosis not present

## 2021-02-14 DIAGNOSIS — M79673 Pain in unspecified foot: Secondary | ICD-10-CM | POA: Diagnosis not present

## 2021-02-14 DIAGNOSIS — E1122 Type 2 diabetes mellitus with diabetic chronic kidney disease: Secondary | ICD-10-CM | POA: Insufficient documentation

## 2021-02-14 DIAGNOSIS — M7989 Other specified soft tissue disorders: Secondary | ICD-10-CM | POA: Diagnosis not present

## 2021-02-14 NOTE — ED Triage Notes (Signed)
Patient to triage via EMS from home.  Per EMS patient with right foot pain for 3 weeks causing decreased mobility.  EMS interventions - hr 92, bp 208/80, pulse oxi 100% on room air, CBG 94.    Patient denies any type of injury.  Has not sought treatment for pain.

## 2021-02-15 MED ORDER — DOXYCYCLINE HYCLATE 100 MG PO CAPS: 100.0000 mg | ORAL_CAPSULE | Freq: Two times a day (BID) | ORAL | 0 refills | Status: AC

## 2021-02-15 MED ORDER — GABAPENTIN 300 MG PO CAPS
300.0000 mg | ORAL_CAPSULE | Freq: Once | ORAL | Status: AC
  Administered 2021-02-15: 300 mg via ORAL
  Filled 2021-02-15: qty 1

## 2021-02-15 MED ORDER — GABAPENTIN 300 MG PO CAPS: 300.0000 mg | ORAL_CAPSULE | Freq: Three times a day (TID) | ORAL | 0 refills | Status: DC | PRN

## 2021-02-15 MED ORDER — DOXYCYCLINE HYCLATE 100 MG PO TABS
100.0000 mg | ORAL_TABLET | Freq: Once | ORAL | Status: AC
  Administered 2021-02-15: 100 mg via ORAL
  Filled 2021-02-15: qty 1

## 2021-02-15 NOTE — ED Provider Notes (Signed)
[facility] Emergency Department Provider Note   ____________________________________________   I have reviewed the triage vital signs and the nursing notes.   HISTORY  Chief Complaint Foot Pain   History limited by: Not Limited   HPI [name] is a 65 y.o. female who presents to the emergency department today because of concerns for right foot pain.  Patient states that the pain started roughly 3 weeks ago.  Over the past week it has gotten worse.  She states today it was so bad she could barely put any weight on it.  She stated initially the pain was located to the outside aspect of her foot and now it is more in her heel.  She denies any trauma prior to the pain starting.  The patient denies any fevers, chills, nausea or vomiting.   Records reviewed. Per medical record review patient has a history of DM, HTN, lymphedema.   Past Medical History:  Diagnosis Date   Diabetes mellitus without complication ([place]) 9735   Hypertension    Hypothyroidism    Kidney stone 11/2014   history of/STAGE 4 KIDNEY DISEASE PER DR Juleen China   Lymphedema    Multiple open wounds of lower leg    PT BEING SEEN BY THE WOUND CENTER FOR CHRONIC BLISTERS PER PT    Patient Active Problem List   Diagnosis Date Noted   Morbid obesity ([place]) 10/24/2017   Chronic kidney disease, stage IV (severe) ([place]) 10/24/2017   Hypothyroidism 11/15/2016   Edema 11/15/2016   Type 2 diabetes mellitus with microalbuminuria, without long-term current use of insulin ([place]) 11/15/2016   Essential hypertension 12/09/2015   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 ([place]) 06/26/2015   Pressure ulcer 05/31/2015   Primary osteoarthritis of right hip 05/28/2015    Past Surgical History:  Procedure Laterality Date   TOTAL HIP ARTHROPLASTY Right 05/28/2015   Procedure: TOTAL HIP ARTHROPLASTY ANTERIOR APPROACH;  Surgeon: Hessie Knows, MD;  Location: [facility] ORS;   Service: Orthopedics;  Laterality: Right;    Prior to Admission medications   Medication Sig Start Date End Date Taking? Authorizing Provider  acetaminophen (TYLENOL) 500 MG tablet Take 500 mg by mouth every 6 (six) hours as needed.    [provider]  aspirin EC 81 MG tablet Take 1 tablet (81 mg total) by mouth daily. 05/12/16   Juline Patch, MD  atorvastatin (LIPITOR) 20 MG tablet Take 1 tablet (20 mg total) by mouth daily. 10/22/20   Juline Patch, MD  blood glucose meter kit and supplies 1 each by Other route daily. ONE TOUCH ULTRA METER. Use as directed. E11.29 10/23/20   Juline Patch, MD  cloNIDine (CATAPRES) 0.3 MG tablet TAKE ONE TABLET BY MOUTH 3 TIMES DAILY. 10/22/20   Juline Patch, MD  furosemide (LASIX) 40 MG tablet Take 1 tablet (40 mg total) by mouth 2 (two) times daily. 10/22/20   Juline Patch, MD  glipiZIDE (GLUCOTROL) 5 MG tablet TAKE 1 TABLET BY MOUTH twice a day 12/15/20   Juline Patch, MD  glucose blood test strip 1 each by Other route daily. ONE TOUCH ULTRA TEST STRIPS E11.29 10/26/20   Juline Patch, MD  Lancets [facility] ULTRASOFT) lancets 1 each by Other route daily. Use as instructed E11.29 10/23/20   Juline Patch, MD  levothyroxine (SYNTHROID) 112 MCG tablet Take 1 tablet (112 mcg total) by mouth daily before breakfast. 12/15/20   Juline Patch, MD  metoprolol succinate (TOPROL-XL) 50 MG 24 hr tablet Take 1 tablet (50 mg total) by mouth daily. 10/22/20   Juline Patch, MD  telmisartan (MICARDIS) 80 MG tablet TAKE ONE TABLET BY MOUTH EVERY DAY 10/22/20   Juline Patch, MD    Allergies Patient has no known allergies.  Family History  Problem Relation Age of Onset   Cancer Mother    Diabetes Father     Social History Social History   Tobacco Use   Smoking status: Never   Smokeless tobacco: Never  Substance Use Topics   Alcohol use: No   Drug use: No    Review of Systems Constitutional: No fever/chills Eyes: No visual  changes. ENT: No sore throat. Cardiovascular: Denies chest pain. Respiratory: Denies shortness of breath. Gastrointestinal: No abdominal pain.  No nausea, no vomiting.  No diarrhea.   Genitourinary: Negative for dysuria. Musculoskeletal: Positive for right foot pain. Skin: Negative for rash. Neurological: Negative for headaches, focal weakness or numbness.  ____________________________________________   PHYSICAL EXAM:  VITAL SIGNS: ED Triage Vitals [02/14/21 2032]  Enc Vitals Group     BP (!) 204/97     Pulse Rate 98     Resp 18     Temp 97.8 F (36.6 C)     Temp Source Oral     SpO2 97 %     Weight 274 lb (124.3 kg)     Height _0  (1.651 m)   Constitutional: Alert and oriented.  Eyes: Conjunctivae are normal.  ENT      Head: Normocephalic and atraumatic.      Nose: No congestion/rhinnorhea.      Mouth/Throat: Mucous membranes are moist.      Neck: No stridor. Hematological/Lymphatic/Immunilogical: No cervical lymphadenopathy. Cardiovascular: Normal rate, regular rhythm.  No murmurs, rubs, or gallops.  Respiratory: Normal respiratory effort without tachypnea nor retractions. Breath sounds are clear and equal bilaterally. No wheezes/rales/rhonchi. Gastrointestinal: Soft and non tender. No rebound. No guarding.  Genitourinary: Deferred Musculoskeletal: Bilateral lower extremity edema. Right foot without any deformity or open wounds. Some tenderness to palpation to the heel and midfoot.  Neurologic:  Normal speech and language. No gross focal neurologic deficits are appreciated.  Skin:  Skin is warm, dry and intact. No rash noted. Psychiatric: Mood and affect are normal. Speech and behavior are normal. Patient exhibits appropriate insight and judgment.  ____________________________________________    LABS (pertinent positives/negatives)  None  ____________________________________________   EKG  None  ____________________________________________     RADIOLOGY  Right foot No acute abnormality  ____________________________________________   PROCEDURES  Procedures  ____________________________________________   INITIAL IMPRESSION / ASSESSMENT AND PLAN / ED COURSE  Pertinent labs & imaging results that were available during my care of the patient were reviewed by me and considered in my medical decision making (see chart for details).   Patient presented to the emergency department today because of concerns for right foot pain.  No traumatic injury prior to the pain starting.  X-ray was obtained which not show any acute fracture.  The patient does have history of lymphedema. On exam no open wounds. At this time have lower suspicion for infection however will plan on starting patient out of an abundance of caution. Additionally will give patient gabapentin. Will give patient podiatry follow up. ____________________________________________   FINAL CLINICAL IMPRESSION(S) / ED DIAGNOSES  Final diagnoses:  Foot pain, right     Note: This dictation was prepared with Dragon dictation. Any transcriptional errors that result from  this process are unintentional     Nance Pear, MD 02/15/21 0100

## 2021-02-15 NOTE — Discharge Instructions (Addendum)
Please seek medical attention for any high fevers, chest pain, shortness of breath, change in behavior, persistent vomiting, bloody stool or any other new or concerning symptoms.  

## 2021-02-16 ENCOUNTER — Telehealth: Payer: Self-pay | Admitting: Family Medicine

## 2021-02-16 NOTE — Telephone Encounter (Signed)
Pt called stating that she was recently seen in ED due to the pain in her heel. She states that she was prescribed gabapentin and it is not helping with the pain. She states that she contacted ED and they stated that she needed to contact PCP until her appt with the podiatrist on 02/26/21. Please advise.       [facility] ([address]), [place] ([place]) [place] [address]  Phone: [phone] Fax: [phone]  Hours: Not open 24 hours

## 2021-02-22 ENCOUNTER — Ambulatory Visit: Payer: Medicare HMO | Admitting: Family Medicine

## 2021-02-23 ENCOUNTER — Ambulatory Visit: Payer: Self-pay | Admitting: Physician Assistant

## 2021-02-26 DIAGNOSIS — M79671 Pain in right foot: Secondary | ICD-10-CM | POA: Diagnosis not present

## 2021-02-26 DIAGNOSIS — M216X1 Other acquired deformities of right foot: Secondary | ICD-10-CM | POA: Diagnosis not present

## 2021-02-26 DIAGNOSIS — L603 Nail dystrophy: Secondary | ICD-10-CM | POA: Diagnosis not present

## 2021-02-26 DIAGNOSIS — I890 Lymphedema, not elsewhere classified: Secondary | ICD-10-CM | POA: Diagnosis not present

## 2021-02-26 DIAGNOSIS — M792 Neuralgia and neuritis, unspecified: Secondary | ICD-10-CM | POA: Diagnosis not present

## 2021-02-26 DIAGNOSIS — M7731 Calcaneal spur, right foot: Secondary | ICD-10-CM | POA: Diagnosis not present

## 2021-02-26 DIAGNOSIS — M722 Plantar fascial fibromatosis: Secondary | ICD-10-CM | POA: Diagnosis not present

## 2021-02-26 DIAGNOSIS — M2141 Flat foot [pes planus] (acquired), right foot: Secondary | ICD-10-CM | POA: Diagnosis not present

## 2021-02-26 DIAGNOSIS — E1142 Type 2 diabetes mellitus with diabetic polyneuropathy: Secondary | ICD-10-CM | POA: Diagnosis not present

## 2021-02-26 DIAGNOSIS — M19071 Primary osteoarthritis, right ankle and foot: Secondary | ICD-10-CM | POA: Diagnosis not present

## 2021-03-05 ENCOUNTER — Telehealth: Payer: Self-pay

## 2021-03-05 NOTE — Telephone Encounter (Signed)
I tried to call pt for 2 reasons. 1) We received a paper from Dr Luana Shu concerning Diabetic shoes. The form is asking that we include supporting documentation. As of right now, there is none. 2) The last time we saw pt in April, she had an elevated bp that was suppose to be followed up on. She has cancelled 2 appts with [name] since then. I tried to call and get a message that states "mailbox is full." We will attempt to send a letter out to pt. For her to call the office concerning getting her back in office for bp check and filling out the form.

## 2021-03-08 ENCOUNTER — Ambulatory Visit: Payer: Self-pay | Admitting: Physician Assistant

## 2021-03-08 NOTE — Telephone Encounter (Signed)
Pt called in, states foot hurts to bad to make appt, has called Dr Luana Shu about injection or med to relieve foot pain and then will cb for [phone] Christel Mormon appt

## 2021-03-08 NOTE — Telephone Encounter (Signed)
Patient is waiting on Dr Luana Shu first then she will determine if she needs to set up appointment with Dr Ronnald Ramp.

## 2021-03-10 ENCOUNTER — Ambulatory Visit (INDEPENDENT_AMBULATORY_CARE_PROVIDER_SITE_OTHER): Payer: Medicare HMO

## 2021-03-10 DIAGNOSIS — Z0000 Encounter for general adult medical examination without abnormal findings: Secondary | ICD-10-CM

## 2021-03-10 NOTE — Progress Notes (Signed)
Subjective:   [name] is a 65 y.o. female who presents for Medicare Annual (Subsequent) preventive examination.  Virtual Visit via Telephone Note  I connected with  [name] on 03/10/21 at  2:00 PM EDT by telephone and verified that I am speaking with the correct person using two identifiers.  Location: Patient: home Provider: [facility] Persons participating in the virtual visit: [place]   I discussed the limitations, risks, security and privacy concerns of performing an evaluation and management service by telephone and the availability of in person appointments. The patient expressed understanding and agreed to proceed.  Interactive audio and video telecommunications were attempted between this nurse and patient, however failed, due to patient having technical difficulties OR patient did not have access to video capability.  We continued and completed visit with audio only.  Some vital signs may be absent or patient reported.   Clemetine Marker, LPN   Review of Systems     Cardiac Risk Factors include: advanced age (>46men, >34 women);diabetes mellitus;hypertension;dyslipidemia;sedentary lifestyle     Objective:    Today's Vitals   03/10/21 1413  PainSc: 10-Worst pain ever   There is no height or weight on file to calculate BMI.  Advanced Directives 03/10/2021 02/14/2021 11/04/2019 05/28/2015 05/07/2015 03/26/2015 02/25/2015  Does Patient Have a Medical Advance Directive? No No No No No No No  Would patient like information on creating a medical advance directive? No - Patient declined - No - Patient declined No - patient declined information Yes - Educational materials given Yes - Educational materials given No - patient declined information    Current Medications (verified) Outpatient Encounter Medications as of 03/10/2021  Medication Sig   acetaminophen (TYLENOL) 500 MG tablet Take 500 mg by mouth every 6 (six) hours as needed.   aspirin EC 81  MG tablet Take 1 tablet (81 mg total) by mouth daily.   atorvastatin (LIPITOR) 20 MG tablet Take 1 tablet (20 mg total) by mouth daily.   blood glucose meter kit and supplies 1 each by Other route daily. ONE TOUCH ULTRA METER. Use as directed. E11.29   cloNIDine (CATAPRES) 0.3 MG tablet TAKE ONE TABLET BY MOUTH 3 TIMES DAILY.   furosemide (LASIX) 40 MG tablet Take 1 tablet (40 mg total) by mouth 2 (two) times daily.   glipiZIDE (GLUCOTROL) 5 MG tablet TAKE 1 TABLET BY MOUTH twice a day   glucose blood test strip 1 each by Other route daily. ONE TOUCH ULTRA TEST STRIPS E11.29   Lancets (ONETOUCH ULTRASOFT) lancets 1 each by Other route daily. Use as instructed E11.29   levothyroxine (SYNTHROID) 112 MCG tablet Take 1 tablet (112 mcg total) by mouth daily before breakfast.   metoprolol succinate (TOPROL-XL) 50 MG 24 hr tablet Take 1 tablet (50 mg total) by mouth daily.   telmisartan (MICARDIS) 80 MG tablet TAKE ONE TABLET BY MOUTH EVERY DAY   gabapentin (NEURONTIN) 300 MG capsule Take 1 capsule (300 mg total) by mouth 3 (three) times daily as needed (pain). (Patient not taking: Reported on 03/10/2021)   No facility-administered encounter medications on file as of 03/10/2021.    Allergies (verified) Patient has no known allergies.   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   Procedure: TOTAL HIP ARTHROPLASTY ANTERIOR APPROACH;  Surgeon: Hessie Knows, MD;  Location: [facility] ORS;  Service: Orthopedics;  Laterality: Right;   Family History  Problem Relation Age of Onset   Cancer Mother    Diabetes Father    Social History    Socioeconomic History   Marital status: Single    Spouse name: Not on file   Number of children: 0   Years of education: Not on file   Highest education level: Not on file  Occupational History   Not on file  Tobacco Use   Smoking status: Never   Smokeless tobacco: Never  Vaping Use   Vaping Use: Never used  Substance and Sexual Activity   Alcohol use: No   Drug use: No   Sexual activity: Never  Other Topics Concern   Not on file  Social History Narrative   Pt lives alone   Social Determinants of Health   Financial Resource Strain: Low Risk    Difficulty of Paying Living Expenses: Not very hard  Food Insecurity: No Food Insecurity   Worried About Charity fundraiser in the Last Year: Never true   [place] in the Last Year: Never true  Transportation Needs: No Transportation Needs   Lack of Transportation (Medical): No   Lack of Transportation (Non-Medical): No  Physical Activity: Inactive   Days of Exercise per Week: 0 days   Minutes of Exercise per Session: 0 min  Stress: No Stress Concern Present   Feeling of Stress : Only a little  Social Connections: Socially Isolated   Frequency of Communication with Friends and Family: More than three times a week   Frequency of Social Gatherings with Friends and Family: Three times a week   Attends Religious Services: Never   Active Member of Clubs or Organizations: No   Attends Music therapist: Never   Marital Status: Never married    Tobacco Counseling Counseling given: Not Answered   Clinical Intake:  Pre-visit preparation completed: Yes  Pain : 0-10 Pain Score: 10-Worst pain ever Pain Type: Neuropathic pain, Chronic pain Pain Location: Foot Pain Orientation: Right, Left Pain Descriptors / Indicators: Aching, Burning Pain Onset: More than a month ago Pain Frequency: Constant     Nutritional Risks: None Diabetes: Yes CBG done?: No Did pt. bring in CBG monitor from home?: No  How  often do you need to have someone help you when you read instructions, pamphlets, or other written materials from your doctor or pharmacy?: 1 - Never  Nutrition Risk Assessment:  Has the patient had any N/V/D within the last 2 months?  No  Does the patient have any non-healing wounds?  No  Has the patient had any unintentional weight loss or weight gain?  No   Diabetes:  Is the patient diabetic?  Yes  If diabetic, was a CBG obtained today?  No  Did the patient bring in their glucometer from home?  No  How often do you monitor your CBG's? daily.   Financial Strains and Diabetes Management:  Are you having any financial strains with the device, your supplies or your medication? No .  Does the patient want to be seen by Chronic Care Management for management of their diabetes?  No  Would the patient like to be referred to  a Nutritionist or for Diabetic Management?  No   Diabetic Exams:  Diabetic Eye Exam: Overdue for diabetic eye exam. Pt has been advised about the importance in completing this exam.   Diabetic Foot Exam: Pt has been advised about the importance in completing this exam.   Interpreter Needed?: No  Information entered by :: Clemetine Marker LPN   Activities of Daily Living In your present state of health, do you have any difficulty performing the following activities: 03/10/2021  Hearing? N  Vision? N  Difficulty concentrating or making decisions? N  Walking or climbing stairs? Y  Dressing or bathing? Y  Doing errands, shopping? N  Preparing Food and eating ? N  Using the Toilet? N  In the past six months, have you accidently leaked urine? N  Do you have problems with loss of bowel control? N  Managing your Medications? N  Managing your Finances? N  Housekeeping or managing your Housekeeping? N  Some recent data might be hidden    Patient Care Team: Juline Patch, MD as PCP - General (Family Medicine) Ubaldo Glassing Javier Docker, MD as Consulting Physician  (Cardiology) Vladimir Faster, [facility] (Pharmacist)  Indicate any recent Medical Services you may have received from other than [facility] providers in the past year (date may be approximate).     Assessment:   This is a routine wellness examination for Natalyah.  Hearing/Vision screen Hearing Screening - Comments:: Pt denies hearing difficulty Vision Screening - Comments:: Past due for eye exam; plans to establish care with [facility]  Dietary issues and exercise activities discussed: Current Exercise Habits: The patient does not participate in regular exercise at present, Exercise limited by: orthopedic condition(s);neurologic condition(s)   Goals Addressed   None    Depression Screen PHQ 2/9 Scores 03/10/2021 10/22/2020 04/30/2020 11/05/2019 11/04/2019 05/03/2018 05/01/2017  PHQ - 2 Score 0 0 0 0 0 0 0  PHQ- 9 Score - 0 0 0 - 0 3    Fall Risk Fall Risk  03/10/2021 04/30/2020 11/05/2019 11/04/2019 01/19/2017  Falls in the past year? 0 0 0 0 No  Number falls in past yr: 0 - - 0 -  Injury with Fall? 0 - - 0 -  Risk for fall due to : Impaired balance/gait;Impaired mobility - - Impaired balance/gait;Impaired mobility -  Follow up Falls prevention discussed Falls evaluation completed Falls evaluation completed Falls prevention discussed -    FALL RISK PREVENTION PERTAINING TO THE HOME:  Any stairs in or around the home? No  If so, are there any without handrails? No  Home free of loose throw rugs in walkways, pet beds, electrical cords, etc? Yes  Adequate lighting in your home to reduce risk of falls? Yes   ASSISTIVE DEVICES UTILIZED TO PREVENT FALLS:  Life alert? No  Use of a cane, walker or w/c? Yes  Grab bars in the bathroom? Yes  Shower chair or bench in shower? No  Elevated toilet seat or a handicapped toilet? No   TIMED UP AND GO:  Was the test performed? No . Telephonic visit.   Cognitive Function: Normal cognitive status assessed by direct observation by this Nurse Health  Advisor. No abnormalities found.       6CIT Screen 11/04/2019  What Year? 0 points  What month? 0 points  What time? 0 points  Count back from 20 0 points  Months in reverse 0 points  Repeat phrase 0 points  Total Score 0    Immunizations Immunization  History  Administered Date(s) Administered   Pneumococcal Polysaccharide-23 09/28/2010   Tdap 05/03/2018    TDAP status: Up to date  Flu Vaccine status: Declined, Education has been provided regarding the importance of this vaccine but patient still declined. Advised may receive this vaccine at local pharmacy or Health Dept. Aware to provide a copy of the vaccination record if obtained from local pharmacy or Health Dept. Verbalized acceptance and understanding.  Pneumococcal vaccine status: Due, Education has been provided regarding the importance of this vaccine. Advised may receive this vaccine at local pharmacy or Health Dept. Aware to provide a copy of the vaccination record if obtained from local pharmacy or Health Dept. Verbalized acceptance and understanding.  Covid-19 vaccine status: Declined, Education has been provided regarding the importance of this vaccine but patient still declined. Advised may receive this vaccine at local pharmacy or Health Dept.or vaccine clinic. Aware to provide a copy of the vaccination record if obtained from local pharmacy or Health Dept. Verbalized acceptance and understanding.  Qualifies for Shingles Vaccine? Yes   Zostavax completed No   Shingrix Completed?: No.    Education has been provided regarding the importance of this vaccine. Patient has been advised to call insurance company to determine out of pocket expense if they have not yet received this vaccine. Advised may also receive vaccine at local pharmacy or Health Dept. Verbalized acceptance and understanding.  Screening Tests Health Maintenance  Topic Date Due   OPHTHALMOLOGY EXAM  Never done   Hepatitis C Screening  Never done    COLONOSCOPY (Pts 45-33yrs Insurance coverage will need to be confirmed)  Never done   MAMMOGRAM  Never done   Zoster Vaccines- Shingrix (1 of 2) Never done   DEXA SCAN  Never done   PNA vac Low Risk Adult (1 of 2 - PCV13) 08/28/2018   HEMOGLOBIN A1C  04/23/2021   FOOT EXAM  04/30/2021   TETANUS/TDAP  05/03/2028   HPV VACCINES  Aged Out   INFLUENZA VACCINE  Discontinued   COVID-19 Vaccine  Discontinued    Health Maintenance  Health Maintenance Due  Topic Date Due   OPHTHALMOLOGY EXAM  Never done   Hepatitis C Screening  Never done   COLONOSCOPY (Pts 45-79yrs Insurance coverage will need to be confirmed)  Never done   MAMMOGRAM  Never done   Zoster Vaccines- Shingrix (1 of 2) Never done   DEXA SCAN  Never done   PNA vac Low Risk Adult (1 of 2 - PCV13) 08/28/2018    Colorectal cancer screening: pt declined colonoscopy  Mammogram status: Completed 10/29/19. Repeat every year  Bone density screening: pt declined  Lung Cancer Screening: (Low Dose CT Chest recommended if Age 83-80 years, 30 pack-year currently smoking OR have quit w/in 15years.) does not qualify.   Additional Screening:  Hepatitis C Screening: does qualify; postponed  Vision Screening: Recommended annual ophthalmology exams for early detection of glaucoma and other disorders of the eye. Is the patient up to date with their annual eye exam?  No  Who is the provider or what is the name of the office in which the patient attends annual eye exams? Not established If pt is not established with a provider, would they like to be referred to a provider to establish care? No .   Dental Screening: Recommended annual dental exams for proper oral hygiene  Community Resource Referral / Chronic Care Management: CRR required this visit?  No   CCM required this visit?  No  Plan:     I have personally reviewed and noted the following in the patient's chart:   Medical and social history Use of alcohol, tobacco or  illicit drugs  Current medications and supplements including opioid prescriptions.  Functional ability and status Nutritional status Physical activity Advanced directives List of other physicians Hospitalizations, surgeries, and ER visits in previous 12 months Vitals Screenings to include cognitive, depression, and falls Referrals and appointments  In addition, I have reviewed and discussed with patient certain preventive protocols, quality metrics, and best practice recommendations. A written personalized care plan for preventive services as well as general preventive health recommendations were provided to patient.     Clemetine Marker, LPN   6/76/1952   Nurse Notes: pt c/o problems with feet, concerned about taking gabapentin due to side effects; advised to schedule office visit

## 2021-03-10 NOTE — Patient Instructions (Signed)
[name] , Thank you for taking time to come for your Medicare Wellness Visit. I appreciate your ongoing commitment to your health goals. Please review the following plan we discussed and let me know if I can assist you in the future.   Screening recommendations/referrals: Colonoscopy: due Mammogram: due Bone Density: due Recommended yearly ophthalmology/optometry visit for glaucoma screening and checkup Recommended yearly dental visit for hygiene and checkup  Vaccinations: Influenza vaccine: declined Pneumococcal vaccine: done 09/28/10 Tdap vaccine: done 05/03/18 Shingles vaccine: Shingrix discussed. Please contact your pharmacy for coverage information.  Covid-19:declined  Advanced directives: Advance directive discussed with you today. Even though you declined this today please call our office should you change your mind and we can give you the proper paperwork for you to fill out.   Conditions/risks identified: Recommend increasing activity as tolerated  Next appointment: Follow up in one year for your annual wellness visit    Preventive Care 65 Years and Older, Female Preventive care refers to lifestyle choices and visits with your health care provider that can promote health and wellness. What does preventive care include? A yearly physical exam. This is also called an annual well check. Dental exams once or twice a year. Routine eye exams. Ask your health care provider how often you should have your eyes checked. Personal lifestyle choices, including: Daily care of your teeth and gums. Regular physical activity. Eating a healthy diet. Avoiding tobacco and drug use. Limiting alcohol use. Practicing safe sex. Taking low-dose aspirin every day. Taking vitamin and mineral supplements as recommended by your health care provider. What happens during an annual well check? The services and screenings done by your health care provider during your annual well check will depend on  your age, overall health, lifestyle risk factors, and family history of disease. Counseling  Your health care provider may ask you questions about your: Alcohol use. Tobacco use. Drug use. Emotional well-being. Home and relationship well-being. Sexual activity. Eating habits. History of falls. Memory and ability to understand (cognition). Work and work Statistician. Reproductive health. Screening  You may have the following tests or measurements: Height, weight, and BMI. Blood pressure. Lipid and cholesterol levels. These may be checked every 5 years, or more frequently if you are over 67 years old. Skin check. Lung cancer screening. You may have this screening every year starting at age 60 if you have a 30-pack-year history of smoking and currently smoke or have quit within the past 15 years. Fecal occult blood test (FOBT) of the stool. You may have this test every year starting at age 7. Flexible sigmoidoscopy or colonoscopy. You may have a sigmoidoscopy every 5 years or a colonoscopy every 10 years starting at age 51. Hepatitis C blood test. Hepatitis B blood test. Sexually transmitted disease (STD) testing. Diabetes screening. This is done by checking your blood sugar (glucose) after you have not eaten for a while (fasting). You may have this done every 1-3 years. Bone density scan. This is done to screen for osteoporosis. You may have this done starting at age 31. Mammogram. This may be done every 1-2 years. Talk to your health care provider about how often you should have regular mammograms. Talk with your health care provider about your test results, treatment options, and if necessary, the need for more tests. Vaccines  Your health care provider may recommend certain vaccines, such as: Influenza vaccine. This is recommended every year. Tetanus, diphtheria, and acellular pertussis (Tdap, Td) vaccine. You may need a Td booster  every 10 years. Zoster vaccine. You may need this  after age 18. Pneumococcal 13-valent conjugate (PCV13) vaccine. One dose is recommended after age 57. Pneumococcal polysaccharide (PPSV23) vaccine. One dose is recommended after age 35. Talk to your health care provider about which screenings and vaccines you need and how often you need them. This information is not intended to replace advice given to you by your health care provider. Make sure you discuss any questions you have with your health care provider. Document Released: 07/10/2015 Document Revised: 03/02/2016 Document Reviewed: 04/14/2015 Elsevier Interactive Patient Education  2017 [employer] Prevention in the Home Falls can cause injuries. They can happen to people of all ages. There are many things you can do to make your home safe and to help prevent falls. What can I do on the outside of my home? Regularly fix the edges of walkways and driveways and fix any cracks. Remove anything that might make you trip as you walk through a door, such as a raised step or threshold. Trim any bushes or trees on the path to your home. Use bright outdoor lighting. Clear any walking paths of anything that might make someone trip, such as rocks or tools. Regularly check to see if handrails are loose or broken. Make sure that both sides of any steps have handrails. Any raised decks and porches should have guardrails on the edges. Have any leaves, snow, or ice cleared regularly. Use sand or salt on walking paths during winter. Clean up any spills in your garage right away. This includes oil or grease spills. What can I do in the bathroom? Use night lights. Install grab bars by the toilet and in the tub and shower. Do not use towel bars as grab bars. Use non-skid mats or decals in the tub or shower. If you need to sit down in the shower, use a plastic, non-slip stool. Keep the floor dry. Clean up any water that spills on the floor as soon as it happens. Remove soap buildup in the tub or  shower regularly. Attach bath mats securely with double-sided non-slip rug tape. Do not have throw rugs and other things on the floor that can make you trip. What can I do in the bedroom? Use night lights. Make sure that you have a light by your bed that is easy to reach. Do not use any sheets or blankets that are too big for your bed. They should not hang down onto the floor. Have a firm chair that has side arms. You can use this for support while you get dressed. Do not have throw rugs and other things on the floor that can make you trip. What can I do in the kitchen? Clean up any spills right away. Avoid walking on wet floors. Keep items that you use a lot in easy-to-reach places. If you need to reach something above you, use a strong step stool that has a grab bar. Keep electrical cords out of the way. Do not use floor polish or wax that makes floors slippery. If you must use wax, use non-skid floor wax. Do not have throw rugs and other things on the floor that can make you trip. What can I do with my stairs? Do not leave any items on the stairs. Make sure that there are handrails on both sides of the stairs and use them. Fix handrails that are broken or loose. Make sure that handrails are as long as the stairways. Check any carpeting to  make sure that it is firmly attached to the stairs. Fix any carpet that is loose or worn. Avoid having throw rugs at the top or bottom of the stairs. If you do have throw rugs, attach them to the floor with carpet tape. Make sure that you have a light switch at the top of the stairs and the bottom of the stairs. If you do not have them, ask someone to add them for you. What else can I do to help prevent falls? Wear shoes that: Do not have high heels. Have rubber bottoms. Are comfortable and fit you well. Are closed at the toe. Do not wear sandals. If you use a stepladder: Make sure that it is fully opened. Do not climb a closed stepladder. Make  sure that both sides of the stepladder are locked into place. Ask someone to hold it for you, if possible. Clearly mark and make sure that you can see: Any grab bars or handrails. First and last steps. Where the edge of each step is. Use tools that help you move around (mobility aids) if they are needed. These include: Canes. Walkers. Scooters. Crutches. Turn on the lights when you go into a dark area. Replace any light bulbs as soon as they burn out. Set up your furniture so you have a clear path. Avoid moving your furniture around. If any of your floors are uneven, fix them. If there are any pets around you, be aware of where they are. Review your medicines with your doctor. Some medicines can make you feel dizzy. This can increase your chance of falling. Ask your doctor what other things that you can do to help prevent falls. This information is not intended to replace advice given to you by your health care provider. Make sure you discuss any questions you have with your health care provider. Document Released: 04/09/2009 Document Revised: 11/19/2015 Document Reviewed: 07/18/2014 Elsevier Interactive Patient Education  2017 [employer].

## 2021-03-12 ENCOUNTER — Telehealth: Payer: Self-pay | Admitting: *Deleted

## 2021-03-12 NOTE — Telephone Encounter (Signed)
Called number on file to sch patient for AWV and was not able to reach patient or leave voicemail. Recording for on phone stated patient VM is full

## 2021-04-07 ENCOUNTER — Ambulatory Visit: Payer: Medicare HMO | Admitting: Podiatry

## 2021-04-15 ENCOUNTER — Ambulatory Visit: Payer: Self-pay | Admitting: *Deleted

## 2021-04-15 NOTE — Telephone Encounter (Signed)
Pt called stating that she has been having trouble with medication, gabapentin. She states that when first taking it it made her loopy, but is recently causing her to have fatigue and feeling like she needs to sleep all day. She states that she is having a hard time walking due to the pain in her feet and is concerned about  taking the medication. Please advise.   Called patient to review medication concerns and symptoms. No answer, unable to leave voicemail. Voicemail full.

## 2021-04-15 NOTE — Telephone Encounter (Signed)
2nd attempt to contact patient  to review symptoms. No answer, no voicemail to leave message set up.

## 2021-04-16 ENCOUNTER — Inpatient Hospital Stay
Admission: EM | Admit: 2021-04-16 | Discharge: 2021-05-18 | DRG: 854 | Disposition: A | Discharge status: Skilled Nursing Facility | Payer: Medicare HMO | Attending: Internal Medicine | Admitting: Internal Medicine

## 2021-04-16 ENCOUNTER — Emergency Department: Payer: Medicare HMO

## 2021-04-16 ENCOUNTER — Encounter: Payer: Self-pay | Admitting: *Deleted

## 2021-04-16 ENCOUNTER — Other Ambulatory Visit: Payer: Self-pay

## 2021-04-16 DIAGNOSIS — N179 Acute kidney failure, unspecified: Secondary | ICD-10-CM | POA: Diagnosis not present

## 2021-04-16 DIAGNOSIS — L03119 Cellulitis of unspecified part of limb: Secondary | ICD-10-CM

## 2021-04-16 DIAGNOSIS — R609 Edema, unspecified: Secondary | ICD-10-CM | POA: Diagnosis not present

## 2021-04-16 DIAGNOSIS — I70263 Atherosclerosis of native arteries of extremities with gangrene, bilateral legs: Secondary | ICD-10-CM | POA: Diagnosis present

## 2021-04-16 DIAGNOSIS — L8990 Pressure ulcer of unspecified site, unspecified stage: Secondary | ICD-10-CM | POA: Insufficient documentation

## 2021-04-16 DIAGNOSIS — H8110 Benign paroxysmal vertigo, unspecified ear: Secondary | ICD-10-CM | POA: Diagnosis present

## 2021-04-16 DIAGNOSIS — L89152 Pressure ulcer of sacral region, stage 2: Secondary | ICD-10-CM | POA: Diagnosis present

## 2021-04-16 DIAGNOSIS — I82409 Acute embolism and thrombosis of unspecified deep veins of unspecified lower extremity: Secondary | ICD-10-CM

## 2021-04-16 DIAGNOSIS — R627 Adult failure to thrive: Secondary | ICD-10-CM | POA: Diagnosis present

## 2021-04-16 DIAGNOSIS — Z743 Need for continuous supervision: Secondary | ICD-10-CM | POA: Diagnosis not present

## 2021-04-16 DIAGNOSIS — I890 Lymphedema, not elsewhere classified: Secondary | ICD-10-CM | POA: Diagnosis present

## 2021-04-16 DIAGNOSIS — R52 Pain, unspecified: Secondary | ICD-10-CM

## 2021-04-16 DIAGNOSIS — E1122 Type 2 diabetes mellitus with diabetic chronic kidney disease: Secondary | ICD-10-CM | POA: Diagnosis present

## 2021-04-16 DIAGNOSIS — L03116 Cellulitis of left lower limb: Secondary | ICD-10-CM | POA: Diagnosis present

## 2021-04-16 DIAGNOSIS — L89620 Pressure ulcer of left heel, unstageable: Secondary | ICD-10-CM | POA: Diagnosis present

## 2021-04-16 DIAGNOSIS — E1142 Type 2 diabetes mellitus with diabetic polyneuropathy: Secondary | ICD-10-CM | POA: Diagnosis present

## 2021-04-16 DIAGNOSIS — M7989 Other specified soft tissue disorders: Secondary | ICD-10-CM | POA: Diagnosis not present

## 2021-04-16 DIAGNOSIS — R42 Dizziness and giddiness: Secondary | ICD-10-CM

## 2021-04-16 DIAGNOSIS — Z7984 Long term (current) use of oral hypoglycemic drugs: Secondary | ICD-10-CM

## 2021-04-16 DIAGNOSIS — E1152 Type 2 diabetes mellitus with diabetic peripheral angiopathy with gangrene: Secondary | ICD-10-CM | POA: Diagnosis present

## 2021-04-16 DIAGNOSIS — M79606 Pain in leg, unspecified: Secondary | ICD-10-CM

## 2021-04-16 DIAGNOSIS — A4159 Other Gram-negative sepsis: Principal | ICD-10-CM | POA: Diagnosis present

## 2021-04-16 DIAGNOSIS — I87313 Chronic venous hypertension (idiopathic) with ulcer of bilateral lower extremity: Secondary | ICD-10-CM | POA: Diagnosis present

## 2021-04-16 DIAGNOSIS — E785 Hyperlipidemia, unspecified: Secondary | ICD-10-CM | POA: Diagnosis present

## 2021-04-16 DIAGNOSIS — I10 Essential (primary) hypertension: Secondary | ICD-10-CM | POA: Diagnosis not present

## 2021-04-16 DIAGNOSIS — A419 Sepsis, unspecified organism: Secondary | ICD-10-CM | POA: Diagnosis not present

## 2021-04-16 DIAGNOSIS — R001 Bradycardia, unspecified: Secondary | ICD-10-CM | POA: Diagnosis not present

## 2021-04-16 DIAGNOSIS — Z87442 Personal history of urinary calculi: Secondary | ICD-10-CM

## 2021-04-16 DIAGNOSIS — R000 Tachycardia, unspecified: Secondary | ICD-10-CM | POA: Diagnosis not present

## 2021-04-16 DIAGNOSIS — Z20822 Contact with and (suspected) exposure to covid-19: Secondary | ICD-10-CM | POA: Diagnosis present

## 2021-04-16 DIAGNOSIS — B964 Proteus (mirabilis) (morganii) as the cause of diseases classified elsewhere: Secondary | ICD-10-CM | POA: Diagnosis present

## 2021-04-16 DIAGNOSIS — L03115 Cellulitis of right lower limb: Secondary | ICD-10-CM | POA: Diagnosis present

## 2021-04-16 DIAGNOSIS — Z79899 Other long term (current) drug therapy: Secondary | ICD-10-CM

## 2021-04-16 DIAGNOSIS — F4322 Adjustment disorder with anxiety: Secondary | ICD-10-CM | POA: Diagnosis present

## 2021-04-16 DIAGNOSIS — L97822 Non-pressure chronic ulcer of other part of left lower leg with fat layer exposed: Secondary | ICD-10-CM | POA: Diagnosis present

## 2021-04-16 DIAGNOSIS — L97529 Non-pressure chronic ulcer of other part of left foot with unspecified severity: Secondary | ICD-10-CM | POA: Diagnosis present

## 2021-04-16 DIAGNOSIS — Z79890 Hormone replacement therapy (postmenopausal): Secondary | ICD-10-CM

## 2021-04-16 DIAGNOSIS — N1832 Chronic kidney disease, stage 3b: Secondary | ICD-10-CM | POA: Diagnosis present

## 2021-04-16 DIAGNOSIS — I739 Peripheral vascular disease, unspecified: Secondary | ICD-10-CM

## 2021-04-16 DIAGNOSIS — E039 Hypothyroidism, unspecified: Secondary | ICD-10-CM | POA: Diagnosis present

## 2021-04-16 DIAGNOSIS — Z6841 Body Mass Index (BMI) 40.0 and over, adult: Secondary | ICD-10-CM

## 2021-04-16 DIAGNOSIS — E1169 Type 2 diabetes mellitus with other specified complication: Secondary | ICD-10-CM | POA: Diagnosis present

## 2021-04-16 DIAGNOSIS — N189 Chronic kidney disease, unspecified: Secondary | ICD-10-CM

## 2021-04-16 DIAGNOSIS — Z96641 Presence of right artificial hip joint: Secondary | ICD-10-CM | POA: Diagnosis present

## 2021-04-16 DIAGNOSIS — E876 Hypokalemia: Secondary | ICD-10-CM | POA: Diagnosis not present

## 2021-04-16 DIAGNOSIS — I872 Venous insufficiency (chronic) (peripheral): Secondary | ICD-10-CM | POA: Diagnosis present

## 2021-04-16 DIAGNOSIS — Z833 Family history of diabetes mellitus: Secondary | ICD-10-CM

## 2021-04-16 DIAGNOSIS — M869 Osteomyelitis, unspecified: Secondary | ICD-10-CM | POA: Diagnosis present

## 2021-04-16 DIAGNOSIS — R600 Localized edema: Secondary | ICD-10-CM | POA: Diagnosis not present

## 2021-04-16 DIAGNOSIS — E875 Hyperkalemia: Secondary | ICD-10-CM | POA: Diagnosis present

## 2021-04-16 DIAGNOSIS — L97812 Non-pressure chronic ulcer of other part of right lower leg with fat layer exposed: Secondary | ICD-10-CM | POA: Diagnosis present

## 2021-04-16 DIAGNOSIS — M79605 Pain in left leg: Secondary | ICD-10-CM | POA: Insufficient documentation

## 2021-04-16 DIAGNOSIS — I1310 Hypertensive heart and chronic kidney disease without heart failure, with stage 1 through stage 4 chronic kidney disease, or unspecified chronic kidney disease: Secondary | ICD-10-CM | POA: Diagnosis present

## 2021-04-16 MED ORDER — FENTANYL CITRATE PF 50 MCG/ML IJ SOSY
50.0000 ug | PREFILLED_SYRINGE | Freq: Once | INTRAMUSCULAR | Status: AC
Start: 2021-04-16 — End: 2021-04-17
  Administered 2021-04-17: 50 ug via INTRAVENOUS
  Filled 2021-04-16 (×2): qty 1

## 2021-04-16 MED ORDER — LACTATED RINGERS IV BOLUS
1000.0000 mL | Freq: Once | INTRAVENOUS | Status: AC
  Administered 2021-04-17: 1000 mL via INTRAVENOUS

## 2021-04-16 MED ORDER — SODIUM CHLORIDE 0.9 % IV SOLN
2.0000 g | Freq: Once | INTRAVENOUS | Status: AC
  Administered 2021-04-17: 2 g via INTRAVENOUS
  Filled 2021-04-16: qty 2

## 2021-04-16 MED ORDER — VANCOMYCIN HCL 2000 MG/400ML IV SOLN
2000.0000 mg | Freq: Once | INTRAVENOUS | Status: AC
  Administered 2021-04-17: 2000 mg via INTRAVENOUS
  Filled 2021-04-16: qty 400

## 2021-04-16 NOTE — ED Notes (Signed)
Patient was incontinent to urine upon arrival. Patient's pants were soaked and shoes had standing urine and feces on them. Patient states she has been sporadic with taking meds over 3-4 days. Patient's clothing was removed, a flea was noted.Patient's hair is greasy. Dr. Tamala Julian at bedside.

## 2021-04-16 NOTE — ED Provider Notes (Signed)
[facility] Emergency Department Provider Note  ____________________________________________   Event Date/Time   First MD Initiated Contact with Patient 04/16/21 2259     (approximate)  I have reviewed the triage vital signs and the nursing notes.   HISTORY  Chief Complaint Leg Pain and Foot Pain   HPI [name] is a 65 y.o. female with a past medical history of DM, HTN, hypothyroidism, kidney stones, lymphedema, morbid obesity and plantar fasciitis followed by podiatry outpatient for some chronic wounds and edema in the lower extremity previously seen at wound care center who presents via EMS after a welfare check was made on the patient with neighbors concerned that patient has been stuck in her recliner for several days.  Patient states her feet have been hurting her for over a month got particularly bad over the last couple of days preventing her from standing or walking.  She states she has been urinating and stooling on herself and has had almost nothing to eat or drink.  Per report she was covered in her own feces and there is Feces all over the dwelling.  Patient denies any recent falls or injuries, headache, earache, sore throat, chest pain, cough, vomiting but is not sure if she has had any diarrhea.  She denies any burning with urination.  No other acute concerns at this time.         Past Medical History:  Diagnosis Date  . Diabetes mellitus without complication (HCC) 2010  . Hypertension   . Hypothyroidism   . Kidney stone 11/2014   history of/STAGE 4 KIDNEY DISEASE PER DR Wynelle Link  . Lymphedema   . Multiple open wounds of lower leg    PT BEING SEEN BY THE WOUND CENTER FOR CHRONIC BLISTERS PER PT    Patient Active Problem List   Diagnosis Date Noted  . Sepsis due to cellulitis (HCC) 04/17/2021  . Morbid obesity (HCC) 10/24/2017  . Chronic kidney disease, stage IV (severe) (HCC) 10/24/2017  . Hypothyroidism 11/15/2016  . Edema  11/15/2016  . Type 2 diabetes mellitus with microalbuminuria, without long-term current use of insulin (HCC) 11/15/2016  . Essential hypertension 12/09/2015  . 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) 06/26/2015  . Pressure ulcer 05/31/2015  . Primary osteoarthritis of right hip 05/28/2015    Past Surgical History:  Procedure Laterality Date  . TOTAL HIP ARTHROPLASTY Right 05/28/2015   Procedure: TOTAL HIP ARTHROPLASTY ANTERIOR APPROACH;  Surgeon: Kennedy Bucker, MD;  Location: [facility] ORS;  Service: Orthopedics;  Laterality: Right;    Prior to Admission medications   Medication Sig Start Date End Date Taking? Authorizing Provider  acetaminophen (TYLENOL) 500 MG tablet Take 500 mg by mouth every 6 (six) hours as needed.    [provider]  aspirin EC 81 MG tablet Take 1 tablet (81 mg total) by mouth daily. 05/12/16   Duanne Limerick, MD  atorvastatin (LIPITOR) 20 MG tablet Take 1 tablet (20 mg total) by mouth daily. 10/22/20   Duanne Limerick, MD  blood glucose meter kit and supplies 1 each by Other route daily. ONE TOUCH ULTRA METER. Use as directed. E11.29 10/23/20   Duanne Limerick, MD  cloNIDine (CATAPRES) 0.3 MG tablet TAKE ONE TABLET BY MOUTH 3 TIMES DAILY. 10/22/20   Duanne Limerick, MD  furosemide (LASIX) 40 MG tablet Take 1 tablet (40 mg total) by mouth 2 (two) times daily. 10/22/20   Duanne Limerick, MD  gabapentin (NEURONTIN) 300 MG capsule Take 1 capsule (300 mg total) by mouth 3 (three) times daily as needed (pain). Patient not taking: Reported on 03/10/2021 02/15/21 02/15/22  Nance Pear, MD  glipiZIDE (GLUCOTROL) 5 MG tablet TAKE 1 TABLET BY MOUTH twice a day 12/15/20   Juline Patch, MD  glucose blood test strip 1 each by Other route daily. ONE TOUCH ULTRA TEST STRIPS E11.29 10/26/20   Juline Patch, MD  Lancets [facility] ULTRASOFT) lancets 1 each by Other route daily. Use as instructed E11.29 10/23/20   Juline Patch, MD   levothyroxine (SYNTHROID) 112 MCG tablet Take 1 tablet (112 mcg total) by mouth daily before breakfast. 12/15/20   Juline Patch, MD  metoprolol succinate (TOPROL-XL) 50 MG 24 hr tablet Take 1 tablet (50 mg total) by mouth daily. 10/22/20   Juline Patch, MD  telmisartan (MICARDIS) 80 MG tablet TAKE ONE TABLET BY MOUTH EVERY DAY 10/22/20   Juline Patch, MD    Allergies Patient has no known allergies.  Family History  Problem Relation Age of Onset  . Cancer Mother   . Diabetes Father     Social History Social History   Tobacco Use  . Smoking status: Never  . Smokeless tobacco: Never  Vaping Use  . Vaping Use: Never used  Substance Use Topics  . Alcohol use: No  . Drug use: No    Review of Systems  Review of Systems  Constitutional:  Positive for malaise/fatigue. Negative for chills and fever.  HENT:  Negative for sore throat.   Eyes:  Negative for pain.  Respiratory:  Negative for cough and stridor.   Cardiovascular:  Negative for chest pain.  Gastrointestinal:  Negative for vomiting.  Genitourinary:  Negative for dysuria.  Musculoskeletal:  Positive for myalgias (both feet and lower legs).  Skin:  Negative for rash.  Neurological:  Positive for weakness. Negative for seizures, loss of consciousness and headaches.  Psychiatric/Behavioral:  Negative for suicidal ideas.   All other systems reviewed and are negative.    ____________________________________________   PHYSICAL EXAM:  VITAL SIGNS: ED Triage Vitals  Enc Vitals Group     BP 04/16/21 2239 (!) 161/74     Pulse Rate 04/16/21 2239 (!) 112     Resp 04/16/21 2239 18     Temp 04/16/21 2239 98.7 F (37.1 C)     Temp Source 04/16/21 2239 Oral     SpO2 04/16/21 2236 98 %     Weight 04/16/21 2244 270 lb (122.5 kg)     Height 04/16/21 2244 $RemoveBefor'5\' 5"'[id number]$  (1.651 m)     Head Circumference --      Peak Flow --      Pain Score 04/16/21 2243 10     Pain Loc --      Pain Edu? --      Excl. in [place]? --    Vitals:    04/17/21 0100 04/17/21 0130  BP: (!) 179/81 (!) 195/86  Pulse: (!) 117 (!) 122  Resp: 15 20  Temp:    SpO2: 100% 100%   Physical Exam Vitals and nursing note reviewed.  Constitutional:      General: She is in acute distress.     Appearance: She is well-developed. She is obese. She is ill-appearing.  HENT:     Head: Normocephalic and atraumatic.     Right Ear: External ear normal.     Left Ear: External ear normal.     Nose:  Nose normal.     Mouth/Throat:     Mouth: Mucous membranes are dry.  Eyes:     Conjunctiva/sclera: Conjunctivae normal.  Cardiovascular:     Rate and Rhythm: Regular rhythm. Tachycardia present.     Heart sounds: No murmur heard. Pulmonary:     Effort: Pulmonary effort is normal. No respiratory distress.     Breath sounds: Normal breath sounds.  Abdominal:     Palpations: Abdomen is soft.     Tenderness: There is no abdominal tenderness.  Musculoskeletal:     Cervical back: Neck supple.     Right lower leg: Edema present.     Left lower leg: Edema present.  Skin:    General: Skin is warm and dry.     Capillary Refill: Capillary refill takes 2 to 3 seconds.  Neurological:     Mental Status: She is alert and oriented to person, place, and time.  Psychiatric:        Mood and Affect: Mood normal.    States she will sacral decubitus wound.  There is also some breakdown of the pannus that does not appear infected on the right mid abdomen.  There is edema of the bile extremities which are indurated with significant erythema and some erosions over several of the toes most significantly on the left first toe down to the phalanx on the dorsal aspect.  Patient is able to move her toes otherwise on command.  Sensation is diminished to light touch on the bilateral lower extremities. ____________________________________________   LABS (all labs ordered are listed, but only abnormal results are displayed)  Labs Reviewed  CBC WITH DIFFERENTIAL/PLATELET -  Abnormal; Notable for the following components:      Result Value   WBC 18.9 (*)    RDW 19.2 (*)    Platelets 429 (*)    Neutro Abs 16.9 (*)    Monocytes Absolute 1.1 (*)    Abs Immature Granulocytes 0.16 (*)    All other components within normal limits  COMPREHENSIVE METABOLIC PANEL - Abnormal; Notable for the following components:   Potassium 5.6 (*)    Chloride 114 (*)    CO2 14 (*)    Glucose, Bld 120 (*)    BUN 60 (*)    Creatinine, Ser 2.13 (*)    Total Protein 8.6 (*)    Albumin 3.2 (*)    GFR, Estimated 25 (*)    All other components within normal limits  PROTIME-INR - Abnormal; Notable for the following components:   Prothrombin Time 16.4 (*)    INR 1.3 (*)    All other components within normal limits  CULTURE, BLOOD (ROUTINE X 2)  CULTURE, BLOOD (ROUTINE X 2)  RESP PANEL BY RT-PCR (FLU A&B, COVID) ARPGX2  CK  APTT  MAGNESIUM  URINALYSIS, COMPLETE (UACMP) WITH MICROSCOPIC  LACTIC ACID, PLASMA  LACTIC ACID, PLASMA  SEDIMENTATION RATE  BRAIN NATRIURETIC PEPTIDE   ____________________________________________  EKG  EKG remarkable for sinus tachycardia with ventricular rate of 121, normal axis, unremarkable intervals with significant artifact in inferior leads and V1 without clear evidence of acute ischemia or significant arrhythmia. ____________________________________________  RADIOLOGY  ED MD interpretation: Plain film of the left foot shows no acute fracture dislocation or other clear evidence of osteomyelitis by radiograph.  There is some soft tissue swelling.  Right foot shows an intra-articular fracture at the base of the fifth digit proximal phalanx but no other acute fractures or clear evidence of osteomyelitis.  Official radiology  report(s): DG Foot 2 Views Left  Result Date: 04/16/2021 CLINICAL DATA:  Concern for osteomyelitis. EXAM: LEFT FOOT - 2 VIEW COMPARISON:  Right foot radiograph dated 04/16/2021. FINDINGS: There is no acute fracture or  dislocation. No evidence of acute osteomyelitis by radiograph. There is diffuse subcutaneous edema and soft tissue swelling of the dorsum of the forefoot. No radiopaque foreign object or soft tissue gas. IMPRESSION: 1. No acute fracture or dislocation. No evidence of acute osteomyelitis by radiograph. 2. Soft tissue swelling of the dorsum of the forefoot. Electronically Signed   By: Anner Crete M.D.   On: 04/16/2021 23:54   DG Foot 2 Views Right  Result Date: 04/17/2021 CLINICAL DATA:  Left unable to stand EXAM: RIGHT FOOT - 2 VIEW COMPARISON:  X-ray right foot 02/14/2021. FINDINGS: Intra-articular fracture of the base of the fifth digit proximal phalanx. Query acute fracture of the fifth digit proximal phalanx head and neck. No cortical erosion or destruction. No dislocation. There is no evidence of arthropathy or other focal bone abnormality. Associated subcutaneus soft tissue edema. IMPRESSION: 1. Intra-articular fracture of the base of the fifth digit proximal phalanx. 2. Query acute fracture of the fifth digit proximal phalanx head and neck. Electronically Signed   By: Iven Finn M.D.   On: 04/17/2021 00:01    ____________________________________________   PROCEDURES  Procedure(s) performed (including Critical Care):  .Critical Care Performed by: Lucrezia Starch, MD Authorized by: Lucrezia Starch, MD   Critical care provider statement:    Critical care time (minutes):  45   Critical care was necessary to treat or prevent imminent or life-threatening deterioration of the following conditions:  Sepsis   Critical care was time spent personally by me on the following activities:  Pulse oximetry, ordering and review of radiographic studies, ordering and review of laboratory studies, ordering and performing treatments and interventions, review of old charts, examination of patient, evaluation of patient's response to treatment, development of treatment plan with patient or surrogate  and obtaining history from patient or surrogate   I assumed direction of critical care for this patient from another provider in my specialty: no     Care discussed with: admitting provider     ____________________________________________   INITIAL IMPRESSION / [place] / ED COURSE      Patient presents with above-stated history exam after being brought to the emergency room after welfare check concerns of patient not been out of her recliner in several days.  Patient states this is because she has had worsening pain in both feet that is preventing her from doing this.  On arrival she is tachycardic to 112 and slightly hypertensive with otherwise stable vital signs on room air.  She does have sequelae of being in chair for several days including sacral decubitus wound and some feces and urine on her clothing.  Lower extremities are edematous with erythema and multiple areas of skin breakdown most notably over the left first toe where it seems there is exposed bone.  No concern about osteomyelitis in addition to the cellulitis.  History exam features to suggest recent trauma although certainly possible patient has a pathologic fracture as well.  Will obtain x-rays of both feet.  Plain film of the left foot shows no acute fracture dislocation or other clear evidence of osteomyelitis by radiograph.  There is some soft tissue swelling.  Right foot shows an intra-articular fracture at the base of the fifth digit proximal phalanx but no other acute fractures  or clear evidence of osteomyelitis.  CMP remarkable for K of 5.6, bicarb of 14, glucose of 124, BUN of 60, creatinine of 2.13 compared to 1.65 months ago and an anion gap of 13.  This is not consistent with DKA.  I suspect likely metabolic acidosis from possible starvation and possibly lactic acidosis.  CK is within normal limits.  Magnesium is within normal limits.  Given tachycardia and CBC showing WBC count of 18,000 with concern for  infectious source in both feet and concern for sepsis.  Code sepsis initiated.  In addition to obtaining a lactic acid and blood cultures will give broad-spectrum antibiotics to cover for possible osteomyelitis and as I am able to probe to bone on the left despite no findings on x-ray suggestive of this.  We will also start fluid resuscitation with 2 L IV and then start her on maintenance.  I will admit to hospital service for further evaluation and management.      ____________________________________________   FINAL CLINICAL IMPRESSION(S) / ED DIAGNOSES  Final diagnoses:  Cellulitis of lower extremity, unspecified laterality  Sepsis, due to unspecified organism, unspecified whether acute organ dysfunction present ([place])  AKI (acute kidney injury) ([place])    Medications  vancomycin (VANCOREADY) IVPB 2000 mg/400 mL (2,000 mg Intravenous New Bag/Given 04/17/21 0129)  lactated ringers infusion ( Intravenous New Bag/Given 04/17/21 0130)  oxyCODONE-acetaminophen (PERCOCET/ROXICET) 5-325 MG per tablet 1 tablet (has no administration in time range)  lactated ringers bolus 1,000 mL (has no administration in time range)  fentaNYL (SUBLIMAZE) injection 50 mcg (50 mcg Intravenous Given 04/17/21 0046)  lactated ringers bolus 1,000 mL (1,000 mLs Intravenous New Bag/Given 04/17/21 0125)  ceFEPIme (MAXIPIME) 2 g in sodium chloride 0.9 % 100 mL IVPB (0 g Intravenous Stopped 04/17/21 0130)     ED Discharge Orders     None        Note:  This document was prepared using Dragon voice recognition software and may include unintentional dictation errors.    Lucrezia Starch, MD 04/17/21 [phone]

## 2021-04-16 NOTE — Progress Notes (Signed)
PHARMACY -  BRIEF ANTIBIOTIC NOTE   Pharmacy has received consult(s) for Cefepime and Vancomycin from an ED provider.  The patient's profile has been reviewed for ht/wt/allergies/indication/available labs.    One time order(s) placed for Cefepime 2 gm and Vancomycin 2 gm per pt wt 122.5 kg.  Further antibiotics/pharmacy consults should be ordered by admitting physician if indicated.                       Thank you, Renda Rolls, PharmD, [facility] 04/16/2021 11:37 PM

## 2021-04-16 NOTE — ED Notes (Signed)
Patient has stage II decub both sides of gluteal folds. Patient has redness under both breasts and under abdominal fold. Area is bleeding under left abdominal fold. Bilateral lower extremities are scaly, reddened and feet are swollen, a toenail fell off from left great toe. Open areas are seen on tip of both great toes.

## 2021-04-16 NOTE — ED Triage Notes (Signed)
Per EMS report, EMS was called out 3 times today to assist the patient to stand. EMS was called by other people, not the patient. Police were called out for a well-person checked by neighbor and had to break into the back door and remove debris in the house from the back door. Per EMS report, patient has been staying in her recliner for several day due to leg and foot pain, with bilateral feet pain being the worse. Patient was incontinent to urine and bowel while sitting in the chair. Patient is a diabetic and has been eating crackers and drinking [facility] per EMS report. No family is available. Patient has wounds on her legs but missed the appointment due to problem with mobility.EMS reports patient has a cat and a large amount of cat feces was on the carpet by the patient's chair.

## 2021-04-17 ENCOUNTER — Inpatient Hospital Stay: Payer: Medicare HMO

## 2021-04-17 DIAGNOSIS — I739 Peripheral vascular disease, unspecified: Secondary | ICD-10-CM | POA: Diagnosis not present

## 2021-04-17 DIAGNOSIS — Z7401 Bed confinement status: Secondary | ICD-10-CM | POA: Diagnosis not present

## 2021-04-17 DIAGNOSIS — M869 Osteomyelitis, unspecified: Secondary | ICD-10-CM | POA: Diagnosis not present

## 2021-04-17 DIAGNOSIS — A4159 Other Gram-negative sepsis: Secondary | ICD-10-CM | POA: Diagnosis not present

## 2021-04-17 DIAGNOSIS — N179 Acute kidney failure, unspecified: Secondary | ICD-10-CM

## 2021-04-17 DIAGNOSIS — S91302A Unspecified open wound, left foot, initial encounter: Secondary | ICD-10-CM | POA: Diagnosis not present

## 2021-04-17 DIAGNOSIS — M7989 Other specified soft tissue disorders: Secondary | ICD-10-CM | POA: Diagnosis not present

## 2021-04-17 DIAGNOSIS — R69 Illness, unspecified: Secondary | ICD-10-CM | POA: Diagnosis not present

## 2021-04-17 DIAGNOSIS — A419 Sepsis, unspecified organism: Secondary | ICD-10-CM

## 2021-04-17 DIAGNOSIS — E785 Hyperlipidemia, unspecified: Secondary | ICD-10-CM | POA: Diagnosis not present

## 2021-04-17 DIAGNOSIS — E875 Hyperkalemia: Secondary | ICD-10-CM | POA: Diagnosis not present

## 2021-04-17 DIAGNOSIS — F4322 Adjustment disorder with anxiety: Secondary | ICD-10-CM | POA: Diagnosis not present

## 2021-04-17 DIAGNOSIS — L03116 Cellulitis of left lower limb: Secondary | ICD-10-CM | POA: Diagnosis not present

## 2021-04-17 DIAGNOSIS — M86171 Other acute osteomyelitis, right ankle and foot: Secondary | ICD-10-CM | POA: Diagnosis not present

## 2021-04-17 DIAGNOSIS — I70248 Atherosclerosis of native arteries of left leg with ulceration of other part of lower left leg: Secondary | ICD-10-CM | POA: Diagnosis not present

## 2021-04-17 DIAGNOSIS — M79661 Pain in right lower leg: Secondary | ICD-10-CM | POA: Diagnosis not present

## 2021-04-17 DIAGNOSIS — L97829 Non-pressure chronic ulcer of other part of left lower leg with unspecified severity: Secondary | ICD-10-CM | POA: Diagnosis not present

## 2021-04-17 DIAGNOSIS — L97519 Non-pressure chronic ulcer of other part of right foot with unspecified severity: Secondary | ICD-10-CM | POA: Diagnosis not present

## 2021-04-17 DIAGNOSIS — L89620 Pressure ulcer of left heel, unstageable: Secondary | ICD-10-CM | POA: Diagnosis not present

## 2021-04-17 DIAGNOSIS — M6281 Muscle weakness (generalized): Secondary | ICD-10-CM | POA: Diagnosis not present

## 2021-04-17 DIAGNOSIS — E1122 Type 2 diabetes mellitus with diabetic chronic kidney disease: Secondary | ICD-10-CM | POA: Diagnosis not present

## 2021-04-17 DIAGNOSIS — I70235 Atherosclerosis of native arteries of right leg with ulceration of other part of foot: Secondary | ICD-10-CM | POA: Diagnosis not present

## 2021-04-17 DIAGNOSIS — L03115 Cellulitis of right lower limb: Secondary | ICD-10-CM | POA: Diagnosis not present

## 2021-04-17 DIAGNOSIS — I87313 Chronic venous hypertension (idiopathic) with ulcer of bilateral lower extremity: Secondary | ICD-10-CM | POA: Diagnosis not present

## 2021-04-17 DIAGNOSIS — L97512 Non-pressure chronic ulcer of other part of right foot with fat layer exposed: Secondary | ICD-10-CM | POA: Diagnosis not present

## 2021-04-17 DIAGNOSIS — E1169 Type 2 diabetes mellitus with other specified complication: Secondary | ICD-10-CM | POA: Diagnosis not present

## 2021-04-17 DIAGNOSIS — Z743 Need for continuous supervision: Secondary | ICD-10-CM | POA: Diagnosis not present

## 2021-04-17 DIAGNOSIS — I70245 Atherosclerosis of native arteries of left leg with ulceration of other part of foot: Secondary | ICD-10-CM | POA: Diagnosis not present

## 2021-04-17 DIAGNOSIS — L03119 Cellulitis of unspecified part of limb: Secondary | ICD-10-CM | POA: Diagnosis not present

## 2021-04-17 DIAGNOSIS — L97822 Non-pressure chronic ulcer of other part of left lower leg with fat layer exposed: Secondary | ICD-10-CM | POA: Diagnosis not present

## 2021-04-17 DIAGNOSIS — R5381 Other malaise: Secondary | ICD-10-CM | POA: Diagnosis not present

## 2021-04-17 DIAGNOSIS — E1142 Type 2 diabetes mellitus with diabetic polyneuropathy: Secondary | ICD-10-CM | POA: Diagnosis not present

## 2021-04-17 DIAGNOSIS — Z6841 Body Mass Index (BMI) 40.0 and over, adult: Secondary | ICD-10-CM | POA: Diagnosis not present

## 2021-04-17 DIAGNOSIS — L97423 Non-pressure chronic ulcer of left heel and midfoot with necrosis of muscle: Secondary | ICD-10-CM | POA: Diagnosis not present

## 2021-04-17 DIAGNOSIS — L89152 Pressure ulcer of sacral region, stage 2: Secondary | ICD-10-CM | POA: Diagnosis not present

## 2021-04-17 DIAGNOSIS — L97819 Non-pressure chronic ulcer of other part of right lower leg with unspecified severity: Secondary | ICD-10-CM | POA: Diagnosis not present

## 2021-04-17 DIAGNOSIS — R627 Adult failure to thrive: Secondary | ICD-10-CM | POA: Diagnosis not present

## 2021-04-17 DIAGNOSIS — Z20822 Contact with and (suspected) exposure to covid-19: Secondary | ICD-10-CM | POA: Diagnosis not present

## 2021-04-17 DIAGNOSIS — I70263 Atherosclerosis of native arteries of extremities with gangrene, bilateral legs: Secondary | ICD-10-CM | POA: Diagnosis not present

## 2021-04-17 DIAGNOSIS — L97812 Non-pressure chronic ulcer of other part of right lower leg with fat layer exposed: Secondary | ICD-10-CM | POA: Diagnosis not present

## 2021-04-17 DIAGNOSIS — L97522 Non-pressure chronic ulcer of other part of left foot with fat layer exposed: Secondary | ICD-10-CM | POA: Diagnosis not present

## 2021-04-17 DIAGNOSIS — N1832 Chronic kidney disease, stage 3b: Secondary | ICD-10-CM | POA: Diagnosis not present

## 2021-04-17 DIAGNOSIS — I70238 Atherosclerosis of native arteries of right leg with ulceration of other part of lower right leg: Secondary | ICD-10-CM | POA: Diagnosis not present

## 2021-04-17 DIAGNOSIS — E039 Hypothyroidism, unspecified: Secondary | ICD-10-CM | POA: Diagnosis not present

## 2021-04-17 DIAGNOSIS — I1310 Hypertensive heart and chronic kidney disease without heart failure, with stage 1 through stage 4 chronic kidney disease, or unspecified chronic kidney disease: Secondary | ICD-10-CM | POA: Diagnosis not present

## 2021-04-17 DIAGNOSIS — B964 Proteus (mirabilis) (morganii) as the cause of diseases classified elsewhere: Secondary | ICD-10-CM | POA: Diagnosis not present

## 2021-04-17 DIAGNOSIS — R42 Dizziness and giddiness: Secondary | ICD-10-CM | POA: Diagnosis not present

## 2021-04-17 DIAGNOSIS — E1152 Type 2 diabetes mellitus with diabetic peripheral angiopathy with gangrene: Secondary | ICD-10-CM | POA: Diagnosis not present

## 2021-04-17 DIAGNOSIS — L97929 Non-pressure chronic ulcer of unspecified part of left lower leg with unspecified severity: Secondary | ICD-10-CM | POA: Diagnosis not present

## 2021-04-17 DIAGNOSIS — L0390 Cellulitis, unspecified: Secondary | ICD-10-CM

## 2021-04-17 DIAGNOSIS — L8990 Pressure ulcer of unspecified site, unspecified stage: Secondary | ICD-10-CM | POA: Insufficient documentation

## 2021-04-17 DIAGNOSIS — E1151 Type 2 diabetes mellitus with diabetic peripheral angiopathy without gangrene: Secondary | ICD-10-CM | POA: Diagnosis not present

## 2021-04-17 DIAGNOSIS — M79662 Pain in left lower leg: Secondary | ICD-10-CM | POA: Diagnosis not present

## 2021-04-17 DIAGNOSIS — N189 Chronic kidney disease, unspecified: Secondary | ICD-10-CM

## 2021-04-17 DIAGNOSIS — M79605 Pain in left leg: Secondary | ICD-10-CM | POA: Diagnosis not present

## 2021-04-17 DIAGNOSIS — R531 Weakness: Secondary | ICD-10-CM | POA: Diagnosis not present

## 2021-04-17 DIAGNOSIS — R52 Pain, unspecified: Secondary | ICD-10-CM | POA: Diagnosis not present

## 2021-04-17 DIAGNOSIS — L97529 Non-pressure chronic ulcer of other part of left foot with unspecified severity: Secondary | ICD-10-CM | POA: Diagnosis not present

## 2021-04-17 DIAGNOSIS — I10 Essential (primary) hypertension: Secondary | ICD-10-CM | POA: Diagnosis not present

## 2021-04-17 DIAGNOSIS — S91301A Unspecified open wound, right foot, initial encounter: Secondary | ICD-10-CM | POA: Diagnosis not present

## 2021-04-17 DIAGNOSIS — R000 Tachycardia, unspecified: Secondary | ICD-10-CM | POA: Diagnosis not present

## 2021-04-17 DIAGNOSIS — I70209 Unspecified atherosclerosis of native arteries of extremities, unspecified extremity: Secondary | ICD-10-CM | POA: Diagnosis not present

## 2021-04-17 LAB — CBC WITH DIFFERENTIAL/PLATELET
Abs Immature Granulocytes: 0.16 10*3/uL — ABNORMAL HIGH (ref 0.00–0.07)
Basophils Absolute: 0.1 10*3/uL (ref 0.0–0.1)
Basophils Relative: 0 %
Eosinophils Absolute: 0 10*3/uL (ref 0.0–0.5)
Eosinophils Relative: 0 %
HCT: 38.9 % (ref 36.0–46.0)
Hemoglobin: 12.6 g/dL (ref 12.0–15.0)
Immature Granulocytes: 1 %
Lymphocytes Relative: 3 %
Lymphs Abs: 0.7 10*3/uL (ref 0.7–4.0)
MCH: 31 pg (ref 26.0–34.0)
MCHC: 32.4 g/dL (ref 30.0–36.0)
MCV: 95.8 fL (ref 80.0–100.0)
Monocytes Absolute: 1.1 10*3/uL — ABNORMAL HIGH (ref 0.1–1.0)
Monocytes Relative: 6 %
Neutro Abs: 16.9 10*3/uL — ABNORMAL HIGH (ref 1.7–7.7)
Neutrophils Relative %: 90 %
Platelets: 429 10*3/uL — ABNORMAL HIGH (ref 150–400)
RBC: 4.06 MIL/uL (ref 3.87–5.11)
RDW: 19.2 % — ABNORMAL HIGH (ref 11.5–15.5)
WBC: 18.9 10*3/uL — ABNORMAL HIGH (ref 4.0–10.5)
nRBC: 0 % (ref 0.0–0.2)

## 2021-04-17 LAB — RESP PANEL BY RT-PCR (FLU A&B, COVID) ARPGX2
Influenza A by PCR: NEGATIVE
Influenza B by PCR: NEGATIVE
SARS Coronavirus 2 by RT PCR: NEGATIVE

## 2021-04-17 LAB — CBC
HCT: 34.5 % — ABNORMAL LOW (ref 36.0–46.0)
Hemoglobin: 11.2 g/dL — ABNORMAL LOW (ref 12.0–15.0)
MCH: 31.3 pg (ref 26.0–34.0)
MCHC: 32.5 g/dL (ref 30.0–36.0)
MCV: 96.4 fL (ref 80.0–100.0)
Platelets: 372 10*3/uL (ref 150–400)
RBC: 3.58 MIL/uL — ABNORMAL LOW (ref 3.87–5.11)
RDW: 19.3 % — ABNORMAL HIGH (ref 11.5–15.5)
WBC: 18.5 10*3/uL — ABNORMAL HIGH (ref 4.0–10.5)
nRBC: 0 % (ref 0.0–0.2)

## 2021-04-17 LAB — BASIC METABOLIC PANEL
Anion gap: 7 (ref 5–15)
BUN: 58 mg/dL — ABNORMAL HIGH (ref 8–23)
CO2: 17 mmol/L — ABNORMAL LOW (ref 22–32)
Calcium: 8.7 mg/dL — ABNORMAL LOW (ref 8.9–10.3)
Chloride: 118 mmol/L — ABNORMAL HIGH (ref 98–111)
Creatinine, Ser: 1.85 mg/dL — ABNORMAL HIGH (ref 0.44–1.00)
GFR, Estimated: 30 mL/min — ABNORMAL LOW (ref >60)
Glucose, Bld: 118 mg/dL — ABNORMAL HIGH (ref 70–99)
Potassium: 4.6 mmol/L (ref 3.5–5.1)
Sodium: 142 mmol/L (ref 135–145)

## 2021-04-17 LAB — COMPREHENSIVE METABOLIC PANEL
ALT: 15 U/L (ref 0–44)
AST: 25 U/L (ref 15–41)
Albumin: 3.2 g/dL — ABNORMAL LOW (ref 3.5–5.0)
Alkaline Phosphatase: 74 U/L (ref 38–126)
Anion gap: 13 (ref 5–15)
BUN: 60 mg/dL — ABNORMAL HIGH (ref 8–23)
CO2: 14 mmol/L — ABNORMAL LOW (ref 22–32)
Calcium: 9 mg/dL (ref 8.9–10.3)
Chloride: 114 mmol/L — ABNORMAL HIGH (ref 98–111)
Creatinine, Ser: 2.13 mg/dL — ABNORMAL HIGH (ref 0.44–1.00)
GFR, Estimated: 25 mL/min — ABNORMAL LOW (ref >60)
Glucose, Bld: 120 mg/dL — ABNORMAL HIGH (ref 70–99)
Potassium: 5.6 mmol/L — ABNORMAL HIGH (ref 3.5–5.1)
Sodium: 141 mmol/L (ref 135–145)
Total Bilirubin: 0.9 mg/dL (ref 0.3–1.2)
Total Protein: 8.6 g/dL — ABNORMAL HIGH (ref 6.5–8.1)

## 2021-04-17 LAB — URINALYSIS, COMPLETE (UACMP) WITH MICROSCOPIC
Bacteria, UA: NONE SEEN
Bilirubin Urine: NEGATIVE
Glucose, UA: NEGATIVE mg/dL
Ketones, ur: 5 mg/dL — AB
Leukocytes,Ua: NEGATIVE
Nitrite: NEGATIVE
Protein, ur: 30 mg/dL — AB
Specific Gravity, Urine: 1.026 (ref 1.005–1.030)
pH: 5 (ref 5.0–8.0)

## 2021-04-17 LAB — PROCALCITONIN: Procalcitonin: 0.73 ng/mL

## 2021-04-17 LAB — PROTIME-INR
INR: 1.3 — ABNORMAL HIGH (ref 0.8–1.2)
INR: 1.4 — ABNORMAL HIGH (ref 0.8–1.2)
Prothrombin Time: 16.4 seconds — ABNORMAL HIGH (ref 11.4–15.2)
Prothrombin Time: 17.4 seconds — ABNORMAL HIGH (ref 11.4–15.2)

## 2021-04-17 LAB — BRAIN NATRIURETIC PEPTIDE: B Natriuretic Peptide: 104.8 pg/mL — ABNORMAL HIGH (ref 0.0–100.0)

## 2021-04-17 LAB — CK: Total CK: 156 U/L (ref 38–234)

## 2021-04-17 LAB — GLUCOSE, CAPILLARY: Glucose-Capillary: 156 mg/dL — ABNORMAL HIGH (ref 70–99)

## 2021-04-17 LAB — HIV ANTIBODY (ROUTINE TESTING W REFLEX): HIV Screen 4th Generation wRfx: NONREACTIVE

## 2021-04-17 LAB — SEDIMENTATION RATE: Sed Rate: 118 mm/hr — ABNORMAL HIGH (ref 0–30)

## 2021-04-17 LAB — APTT: aPTT: 24 seconds (ref 24–36)

## 2021-04-17 LAB — LACTIC ACID, PLASMA: Lactic Acid, Venous: 2 mmol/L (ref 0.5–1.9)

## 2021-04-17 LAB — CORTISOL-AM, BLOOD: Cortisol - AM: 36.5 ug/dL — ABNORMAL HIGH (ref 6.7–22.6)

## 2021-04-17 LAB — MAGNESIUM: Magnesium: 2.4 mg/dL (ref 1.7–2.4)

## 2021-04-17 MED ORDER — ONDANSETRON HCL 4 MG/2ML IJ SOLN: 4.0000 mg | Freq: Four times a day (QID) | INTRAMUSCULAR | Status: DC | PRN

## 2021-04-17 MED ORDER — MAGNESIUM HYDROXIDE 400 MG/5ML PO SUSP: 30.0000 mL | Freq: Every day | ORAL | Status: DC | PRN

## 2021-04-17 MED ORDER — ACETAMINOPHEN 325 MG PO TABS
650.0000 mg | ORAL_TABLET | Freq: Four times a day (QID) | ORAL | Status: DC | PRN
  Administered 2021-05-13 – 2021-05-18 (×2): 650 mg via ORAL
  Filled 2021-04-17 (×3): qty 2

## 2021-04-17 MED ORDER — VANCOMYCIN HCL IN DEXTROSE 1-5 GM/200ML-% IV SOLN: 1000.0000 mg | Freq: Once | INTRAVENOUS | Status: DC

## 2021-04-17 MED ORDER — METRONIDAZOLE 500 MG/100ML IV SOLN
500.0000 mg | Freq: Two times a day (BID) | INTRAVENOUS | Status: DC
Start: 2021-04-17 — End: 2021-04-20
  Administered 2021-04-17 – 2021-04-20 (×7): 500 mg via INTRAVENOUS
  Filled 2021-04-17 (×7): qty 100

## 2021-04-17 MED ORDER — ENOXAPARIN SODIUM 60 MG/0.6ML IJ SOSY
0.5000 mg/kg | PREFILLED_SYRINGE | INTRAMUSCULAR | Status: DC
  Administered 2021-04-17: 62.5 mg via SUBCUTANEOUS
  Filled 2021-04-17: qty 1.2

## 2021-04-17 MED ORDER — OXYCODONE-ACETAMINOPHEN 5-325 MG PO TABS
1.0000 | ORAL_TABLET | Freq: Once | ORAL | Status: AC
  Administered 2021-04-17: 1 via ORAL
  Filled 2021-04-17: qty 1

## 2021-04-17 MED ORDER — ATORVASTATIN CALCIUM 20 MG PO TABS
20.0000 mg | ORAL_TABLET | Freq: Every day | ORAL | Status: DC
  Administered 2021-04-17 – 2021-05-05 (×17): 20 mg via ORAL
  Filled 2021-04-17 (×18): qty 1

## 2021-04-17 MED ORDER — HYDROMORPHONE HCL 1 MG/ML IJ SOLN
0.5000 mg | INTRAMUSCULAR | Status: DC | PRN
  Administered 2021-04-17 – 2021-04-27 (×6): 0.5 mg via INTRAVENOUS
  Filled 2021-04-17 (×6): qty 0.5

## 2021-04-17 MED ORDER — ONDANSETRON HCL 4 MG PO TABS: 4.0000 mg | ORAL_TABLET | Freq: Four times a day (QID) | ORAL | Status: DC | PRN

## 2021-04-17 MED ORDER — SODIUM CHLORIDE 0.9 % IV SOLN
2.0000 g | Freq: Two times a day (BID) | INTRAVENOUS | Status: DC
  Administered 2021-04-17 – 2021-04-19 (×6): 2 g via INTRAVENOUS
  Filled 2021-04-17 (×9): qty 2

## 2021-04-17 MED ORDER — LEVOTHYROXINE SODIUM 112 MCG PO TABS
112.0000 ug | ORAL_TABLET | Freq: Every day | ORAL | Status: DC
  Administered 2021-04-17 – 2021-05-18 (×26): 112 ug via ORAL
  Filled 2021-04-17 (×33): qty 1

## 2021-04-17 MED ORDER — GABAPENTIN 300 MG PO CAPS
300.0000 mg | ORAL_CAPSULE | Freq: Two times a day (BID) | ORAL | Status: DC
  Administered 2021-04-18 – 2021-05-01 (×6): 300 mg via ORAL
  Filled 2021-04-17 (×21): qty 1

## 2021-04-17 MED ORDER — VANCOMYCIN HCL 750 MG/150ML IV SOLN
750.0000 mg | INTRAVENOUS | Status: DC
  Administered 2021-04-17: 750 mg via INTRAVENOUS
  Filled 2021-04-17 (×2): qty 150

## 2021-04-17 MED ORDER — GABAPENTIN 300 MG PO CAPS: 300.0000 mg | ORAL_CAPSULE | Freq: Every day | ORAL | Status: DC

## 2021-04-17 MED ORDER — METOPROLOL SUCCINATE ER 50 MG PO TB24
50.0000 mg | ORAL_TABLET | Freq: Every day | ORAL | Status: DC
  Administered 2021-04-17 – 2021-05-17 (×29): 50 mg via ORAL
  Filled 2021-04-17 (×31): qty 1

## 2021-04-17 MED ORDER — ACETAMINOPHEN 650 MG RE SUPP
650.0000 mg | Freq: Four times a day (QID) | RECTAL | Status: DC | PRN
  Filled 2021-04-17: qty 1

## 2021-04-17 MED ORDER — CLONIDINE HCL 0.1 MG PO TABS
0.3000 mg | ORAL_TABLET | Freq: Three times a day (TID) | ORAL | Status: DC
  Administered 2021-04-17 – 2021-05-18 (×88): 0.3 mg via ORAL
  Filled 2021-04-17 (×91): qty 3

## 2021-04-17 MED ORDER — LACTATED RINGERS IV BOLUS
1000.0000 mL | Freq: Once | INTRAVENOUS | Status: AC
  Administered 2021-04-17: 1000 mL via INTRAVENOUS

## 2021-04-17 MED ORDER — TRAZODONE HCL 50 MG PO TABS
25.0000 mg | ORAL_TABLET | Freq: Every evening | ORAL | Status: DC | PRN
  Administered 2021-04-18 – 2021-05-16 (×11): 25 mg via ORAL
  Filled 2021-04-17 (×13): qty 1

## 2021-04-17 MED ORDER — SODIUM CHLORIDE 0.9 % IV SOLN: 2.0000 g | Freq: Once | INTRAVENOUS | Status: DC

## 2021-04-17 MED ORDER — HYDROCERIN EX CREA
TOPICAL_CREAM | Freq: Every day | CUTANEOUS | Status: AC
  Filled 2021-04-17 (×2): qty 113

## 2021-04-17 MED ORDER — LACTATED RINGERS IV SOLN: INTRAVENOUS | Status: AC

## 2021-04-17 MED ORDER — OXYCODONE HCL 5 MG PO TABS
5.0000 mg | ORAL_TABLET | ORAL | Status: DC | PRN
  Administered 2021-04-17 – 2021-04-26 (×10): 5 mg via ORAL
  Filled 2021-04-17 (×10): qty 1

## 2021-04-17 NOTE — H&P (Signed)
[facility]   PATIENT NAME: [name]    MR#:  [id number]  DATE OF BIRTH:  Nov 25, 1955  DATE OF ADMISSION:  04/16/2021  PRIMARY CARE PHYSICIAN: Juline Patch, MD   Patient is coming from: Home  REQUESTING/REFERRING PHYSICIAN: Hulan Saas, MD  CHIEF COMPLAINT:   Chief Complaint  Patient presents with  . Leg Pain  . Foot Pain    HISTORY OF PRESENT ILLNESS:  [name] is a 65 y.o. Caucasian female with medical history significant for type 2 diabetes mellitus, hypertension and hypothyroidism, CKD stage IIIb, nephrolithiasis and lymphedema as well as plantar fasciitis, who presented to the emergency room with acute onset of bilateral feet and leg pain, who has been sitting in her wheelchair apparently for 3 days when her neighbor was checking on her.  The patient's feet have been hurting for over a month significantly worsening over the last couple of days making her unable to ambulate.  Unfortunately patient has been urinating and defecating on herself unsteady without p.o. intake.  She was therefore covered with her on feces and urine per her neighbor.  No recent falls or injuries.  No chest pain or dyspnea or palpitations.  No nausea or vomiting or abdominal pain.  She was having worsening pain in the left fifth toe.  ED Course: Upon presentation to the emergency room, blood pressure was 161/74 with a heart rate of 112 with otherwise normal vital signs.  Labs revealed potassium of 5.6 and a BN of 60 with a creatinine of 2.13 higher than previous levels.  Albumin was 3.2 and total protein 8.6.  Lactic acid was 2 and CBC showed leukocytosis 18.9 with neutrophilia.  Sed rate was 118.  INR was 1.3 with PT of 16.4.  Influenza antigens and COVID-19 PCR came back negative.  UA was unremarkable.  Blood cultures were drawn. EKG as reviewed by me : EKG showed sinus tachycardia with rate 121 with multAnd Q waves anteroseptally.iplePremature ventricular and supraventricular  complexes Imaging: 2 view x-ray of the left foot showed soft tissue swelling of the dorsum of the forefoot with no other acute abnormalities.he left foot.  2 view x-ray of the right foot revealed intra-articular fract distal proximal phalanx and query acute fracture of the fifth digit proximal phalanx head and neck.ure of the base of the fifth.  The patient was given IV cefepime and vancomycin as well as IV fentanyl, hydration with 2 L bolus of IV lactated Ringer followed 150 mill per hour.  She will be admitted to a medical bed for further evaluation and management. PAST MEDICAL HISTORY:   Past Medical History:  Diagnosis Date  . Diabetes mellitus without complication ([place]) 2707  . Hypertension   . Hypothyroidism   . Kidney stone 11/2014   history of/STAGE 4 KIDNEY DISEASE PER DR Juleen China  . Lymphedema   . Multiple open wounds of lower leg    PT BEING SEEN BY THE WOUND CENTER FOR CHRONIC BLISTERS PER PT    PAST SURGICAL HISTORY:   Past Surgical History:  Procedure Laterality Date  . TOTAL HIP ARTHROPLASTY Right 05/28/2015   Procedure: TOTAL HIP ARTHROPLASTY ANTERIOR APPROACH;  Surgeon: Hessie Knows, MD;  Location: [facility] ORS;  Service: Orthopedics;  Laterality: Right;    SOCIAL HISTORY:   Social History   Tobacco Use  . Smoking status: Never  . Smokeless tobacco: Never  Substance Use Topics  . Alcohol use: No    FAMILY HISTORY:   Family  History  Problem Relation Age of Onset  . Cancer Mother   . Diabetes Father     DRUG ALLERGIES:  No Known Allergies  REVIEW OF SYSTEMS:   ROS As per history of present illness. All pertinent systems were reviewed above. Constitutional, HEENT, cardiovascular, respiratory, GI, GU, musculoskeletal, neuro, psychiatric, endocrine, integumentary and hematologic systems were reviewed and are otherwise negative/unremarkable except for positive findings mentioned above in the HPI.   MEDICATIONS AT HOME:   Prior to Admission medications    Medication Sig Start Date End Date Taking? Authorizing Provider  acetaminophen (TYLENOL) 500 MG tablet Take 500-1,000 mg by mouth every 6 (six) hours as needed for mild pain or moderate pain.   Yes [provider]  aspirin EC 81 MG tablet Take 1 tablet (81 mg total) by mouth daily. 05/12/16  Yes Juline Patch, MD  atorvastatin (LIPITOR) 20 MG tablet Take 1 tablet (20 mg total) by mouth daily. 10/22/20  Yes Juline Patch, MD  blood glucose meter kit and supplies 1 each by Other route daily. ONE TOUCH ULTRA METER. Use as directed. E11.29 10/23/20  Yes Juline Patch, MD  cloNIDine (CATAPRES) 0.3 MG tablet TAKE ONE TABLET BY MOUTH 3 TIMES DAILY. 10/22/20  Yes Juline Patch, MD  furosemide (LASIX) 40 MG tablet Take 1 tablet (40 mg total) by mouth 2 (two) times daily. 10/22/20  Yes Juline Patch, MD  gabapentin (NEURONTIN) 300 MG capsule Take 1 capsule (300 mg total) by mouth 3 (three) times daily as needed (pain). Patient taking differently: Take 300 mg by mouth at bedtime. 02/15/21 02/15/22 Yes Nance Pear, MD  glipiZIDE (GLUCOTROL) 5 MG tablet TAKE 1 TABLET BY MOUTH twice a day 12/15/20  Yes [name] C, MD  glucose blood test strip 1 each by Other route daily. ONE TOUCH ULTRA TEST STRIPS E11.29 10/26/20  Yes Juline Patch, MD  Lancets [facility] ULTRASOFT) lancets 1 each by Other route daily. Use as instructed E11.29 10/23/20  Yes Juline Patch, MD  levothyroxine (SYNTHROID) 112 MCG tablet Take 1 tablet (112 mcg total) by mouth daily before breakfast. 12/15/20  Yes Juline Patch, MD  metoprolol succinate (TOPROL-XL) 50 MG 24 hr tablet Take 1 tablet (50 mg total) by mouth daily. 10/22/20  Yes Juline Patch, MD  telmisartan (MICARDIS) 80 MG tablet TAKE ONE TABLET BY MOUTH EVERY DAY 10/22/20  Yes Juline Patch, MD      VITAL SIGNS:  Blood pressure (!) 163/88, pulse (!) 117, temperature 98.7 F (37.1 C), temperature source Oral, resp. rate 13, height 5' 5" (1.651 m), weight  122.5 kg, SpO2 100 %.  PHYSICAL EXAMINATION:  Physical Exam  GENERAL:  65 y.o.-year-old Caucasian female patient lying in the bed with no acute distress.  EYES: Pupils equal, round, reactive to light and accommodation. No scleral icterus. Extraocular muscles intact.  HEENT: Head atraumatic, normocephalic. Oropharynx and nasopharynx clear.  NECK:  Supple, no jugular venous distention. No thyroid enlargement, no tenderness.  LUNGS: Normal breath sounds bilaterally, no wheezing, rales,rhonchi or crepitation. No use of accessory muscles of respiration.  CARDIOVASCULAR: Regular rate and rhythm, S1, S2 normal. No murmurs, rubs, or gallops.  ABDOMEN: Soft, nondistended, nontender. Bowel sounds present. No organomegaly or mass.  EXTREMITIES: No pedal edema, cyanosis, or clubbing.  NEUROLOGIC: Cranial nerves II through XII are intact. Muscle strength 5/5 in all extremities. Sensation intact. Gait not checked.  PSYCHIATRIC: The patient is alert and oriented x 3.  Normal affect  and good eye contact. SKIN: Bilateral leg and foot swelling with erythema and bilateral leg lymphedema with lichenification and tenderness over the lower both feet and more on the right fifth toe.       LABORATORY PANEL:   CBC Recent Labs  Lab 04/17/21 0019  WBC 18.9*  HGB 12.6  HCT 38.9  PLT 429*   ------------------------------------------------------------------------------------------------------------------  Chemistries  Recent Labs  Lab 04/17/21 0019  NA 141  K 5.6*  CL 114*  CO2 14*  GLUCOSE 120*  BUN 60*  CREATININE 2.13*  CALCIUM 9.0  MG 2.4  AST 25  ALT 15  ALKPHOS 74  BILITOT 0.9   ------------------------------------------------------------------------------------------------------------------  Cardiac Enzymes No results for input(s): TROPONINI in the last 168  hours. ------------------------------------------------------------------------------------------------------------------  RADIOLOGY:  DG Foot 2 Views Left  Result Date: 04/16/2021 CLINICAL DATA:  Concern for osteomyelitis. EXAM: LEFT FOOT - 2 VIEW COMPARISON:  Right foot radiograph dated 04/16/2021. FINDINGS: There is no acute fracture or dislocation. No evidence of acute osteomyelitis by radiograph. There is diffuse subcutaneous edema and soft tissue swelling of the dorsum of the forefoot. No radiopaque foreign object or soft tissue gas. IMPRESSION: 1. No acute fracture or dislocation. No evidence of acute osteomyelitis by radiograph. 2. Soft tissue swelling of the dorsum of the forefoot. Electronically Signed   By: Anner Crete M.D.   On: 04/16/2021 23:54   DG Foot 2 Views Right  Result Date: 04/17/2021 CLINICAL DATA:  Left unable to stand EXAM: RIGHT FOOT - 2 VIEW COMPARISON:  X-ray right foot 02/14/2021. FINDINGS: Intra-articular fracture of the base of the fifth digit proximal phalanx. Query acute fracture of the fifth digit proximal phalanx head and neck. No cortical erosion or destruction. No dislocation. There is no evidence of arthropathy or other focal bone abnormality. Associated subcutaneus soft tissue edema. IMPRESSION: 1. Intra-articular fracture of the base of the fifth digit proximal phalanx. 2. Query acute fracture of the fifth digit proximal phalanx head and neck. Electronically Signed   By: Iven Finn M.D.   On: 04/17/2021 00:01      IMPRESSION AND PLAN:  Active Problems:   Sepsis due to cellulitis ([place]) 1.  Sepsis due to bilateral lower extremity due to severe nonpurulent cellulitis.  Sepsis manifested by tachycardia and leukocytosis. - The patient will be admitted to a medical bed. - We will continue antibiotic therapy with IV cefepime and vancomycin. - Pain management to be provided. - Podiatry consult will be obtained.  2.  Acute kidney injury superimposed on  stage IIIb chronic kidney disease. - The patient will be hydrated with IV normal saline and will follow BMP. - We will avoid nephrotoxins.  3.  Hyperkalemia. - We will follow potassium with hydration. - We will hold off myocarditis.  4.  Hypothyroidism. - We will continue Synthroid.  5.  Type 2 diabetes mellitus with peripheral neuropathy. - The patient will be placed on supplement coverage with NovoLog. - We will hold oral antidiabetics for now. - We will continue Neurontin.  6.  Essential hypertension. - We will continue clonidine, Toprol-XL and hold off myocarditis.  DVT prophylaxis: Lovenox. Code Status: full code. Family Communication:  The plan of care was discussed in details with the patient (and family). I answered all questions. The patient agreed to proceed with the above mentioned plan. Further management will depend upon hospital course. Disposition Plan: Back to previous home environment Consults called: Podiatry. All the records are reviewed and case discussed with ED provider.  Status is: Inpatient   Remains inpatient appropriate because:Ongoing diagnostic testing needed not appropriate for outpatient work up, Unsafe d/c plan, IV treatments appropriate due to intensity of illness or inability to take PO, and Inpatient level of care appropriate due to severity of illness   Dispo: The patient is from: Home              Anticipated d/c is to: Home              Patient currently is not medically stable to d/c.              Difficult to place patient: No  TOTAL TIME TAKING CARE OF THIS PATIENT: 55 minutes.     Christel Mormon M.D on 04/17/2021 at 3:01 AM  [facility]   From 7 PM-7 AM, contact night-coverage [email or web address]  CC: Primary care physician; Juline Patch, MD

## 2021-04-17 NOTE — Consult Note (Signed)
[facility] Nurse Consult Note: Reason for Consult: Foot and LE ulcerations, suspected osteomyelitis to right foot.  Dr. Vickki Muff (Podiatric Medicine) has just evaluated patient within the last hour and provided photo documentation in the EMR. He made recommendations for topical care.  I will implement those recommendations and add additional skin care orders for Nursing. I will also provide a mattress replacement, guidance for Turning and repositioning, pressure redistribution heel boots and a pressure redistribution chair cushion for times when patient will be OOB in the chair. Dr. Vickki Muff additionally has recommended a vascular surgery evaluation. Wound type: Pressure Injury POA: Yes Measurement:To be obtained by Nursing staff today prior to first dressing change and documented (L x W x D in cm) on Nursing Flow Sheet Wound bed: As illustrated in photos Drainage (amount, consistency, odor) small, serous from all wounds Periwound: with plaque formation on anterior LE/pretibial areas Dressing procedure/placement/frequency: As noted above, I will implement the recommended wound care topical care orders of Dr. Vickki Muff as I am in agreement. Those consist of applying silver hydrofiber (Aquacel Ag+ Advantage) to all wounds daily after cleansing, topping with gauze, ABD and securing with Kerlix roll gauze/paper tape. I have added Eucerin cream application post cleaning to the intact skin on the LEs once daily. Additionally, I have ordered a mattress replacement with low air loss feature, bilateral pressure redistribution heel boots and a pressure redistribution chair cushion. Eucerin cream is ordered for skin care after cleansing to address the plaques on the anterior LEs.  [facility] nursing team will not follow, but will remain available to this patient, the nursing and medical teams.  Please re-consult if needed. Thanks, Maudie Flakes, MSN, RN, [place], Arther Abbott  Pager# [phone]

## 2021-04-17 NOTE — Consult Note (Signed)
ORTHOPAEDIC CONSULTATION  REQUESTING PHYSICIAN: Sidney Ace, MD  Chief Complaint: Foot ulcers with possible osteomyelitis right foot  HPI: [name] is a 65 y.o. female admitted with lower extremity cellulitis and sepsis work-up.  Found in her home wheelchair had been there for several days.  Neighbor checked on her.  Patient complaining of worsening pain into her lower legs.  Brought by EMS to the hospital.  Upon admission noted ulcer to her right femur.  X-rays taken.  Concern for possible osteomyelitis of the right fourth toe.  Patient not easily arousable today.  Did not answer questions appropriately.  Did express pain into the lower extremities and feet with the examination today.  Was not able to provide any review of systems or history.  Past Medical History:  Diagnosis Date   Diabetes mellitus without complication ([place]) 3875   Hypertension    Hypothyroidism    Kidney stone 11/2014   history of/STAGE 4 KIDNEY DISEASE PER DR Juleen China   Lymphedema    Multiple open wounds of lower leg    PT BEING SEEN BY THE WOUND CENTER FOR CHRONIC BLISTERS PER PT   Past Surgical History:  Procedure Laterality Date   TOTAL HIP ARTHROPLASTY Right 05/28/2015   Procedure: TOTAL HIP ARTHROPLASTY ANTERIOR APPROACH;  Surgeon: Hessie Knows, MD;  Location: [facility] ORS;  Service: Orthopedics;  Laterality: Right;   Social History   Socioeconomic History   Marital status: Single    Spouse name: Not on file   Number of children: 0   Years of education: Not on file   Highest education level: Not on file  Occupational History   Not on file  Tobacco Use   Smoking status: Never   Smokeless tobacco: Never  Vaping Use   Vaping Use: Never used  Substance and Sexual Activity   Alcohol use: No   Drug use: No   Sexual activity: Never  Other Topics Concern   Not on file  Social History Narrative   Pt lives alone   Social Determinants of Health   Financial Resource Strain: Low Risk     Difficulty of Paying Living Expenses: Not very hard  Food Insecurity: No Food Insecurity   Worried About Charity fundraiser in the Last Year: Never true   [place] in the Last Year: Never true  Transportation Needs: No Transportation Needs   Lack of Transportation (Medical): No   Lack of Transportation (Non-Medical): No  Physical Activity: Inactive   Days of Exercise per Week: 0 days   Minutes of Exercise per Session: 0 min  Stress: No Stress Concern Present   Feeling of Stress : Only a little  Social Connections: Socially Isolated   Frequency of Communication with Friends and Family: More than three times a week   Frequency of Social Gatherings with Friends and Family: Three times a week   Attends Religious Services: Never   Active Member of Clubs or Organizations: No   Attends Archivist Meetings: Never   Marital Status: Never married   Family History  Problem Relation Age of Onset   Cancer Mother    Diabetes Father    No Known Allergies Prior to Admission medications   Medication Sig Start Date End Date Taking? Authorizing Provider  acetaminophen (TYLENOL) 500 MG tablet Take 500-1,000 mg by mouth every 6 (six) hours as needed for mild pain or moderate pain.   Yes [provider]  aspirin EC 81 MG tablet Take 1  tablet (81 mg total) by mouth daily. 05/12/16  Yes Juline Patch, MD  atorvastatin (LIPITOR) 20 MG tablet Take 1 tablet (20 mg total) by mouth daily. 10/22/20  Yes Juline Patch, MD  blood glucose meter kit and supplies 1 each by Other route daily. ONE TOUCH ULTRA METER. Use as directed. E11.29 10/23/20  Yes Juline Patch, MD  cloNIDine (CATAPRES) 0.3 MG tablet TAKE ONE TABLET BY MOUTH 3 TIMES DAILY. 10/22/20  Yes Juline Patch, MD  furosemide (LASIX) 40 MG tablet Take 1 tablet (40 mg total) by mouth 2 (two) times daily. 10/22/20  Yes Juline Patch, MD  gabapentin (NEURONTIN) 300 MG capsule Take 1 capsule (300 mg total) by mouth 3 (three)  times daily as needed (pain). Patient taking differently: Take 300 mg by mouth at bedtime. 02/15/21 02/15/22 Yes Nance Pear, MD  glipiZIDE (GLUCOTROL) 5 MG tablet TAKE 1 TABLET BY MOUTH twice a day 12/15/20  Yes [name] C, MD  glucose blood test strip 1 each by Other route daily. ONE TOUCH ULTRA TEST STRIPS E11.29 10/26/20  Yes Juline Patch, MD  Lancets [facility] ULTRASOFT) lancets 1 each by Other route daily. Use as instructed E11.29 10/23/20  Yes Juline Patch, MD  levothyroxine (SYNTHROID) 112 MCG tablet Take 1 tablet (112 mcg total) by mouth daily before breakfast. 12/15/20  Yes Juline Patch, MD  metoprolol succinate (TOPROL-XL) 50 MG 24 hr tablet Take 1 tablet (50 mg total) by mouth daily. 10/22/20  Yes Juline Patch, MD  telmisartan (MICARDIS) 80 MG tablet TAKE ONE TABLET BY MOUTH EVERY DAY 10/22/20  Yes Juline Patch, MD   US Venous Img Lower Bilateral (DVT)  Result Date: 04/17/2021 CLINICAL DATA:  Bilateral pain and swelling EXAM: BILATERAL LOWER EXTREMITY VENOUS DOPPLER ULTRASOUND TECHNIQUE: Gray-scale sonography with compression, as well as color and duplex ultrasound, were performed to evaluate the deep venous system(s) from the level of the common femoral vein through the popliteal and proximal calf veins. COMPARISON:  None. FINDINGS: VENOUS Normal compressibility of the common femoral, superficial femoral, and popliteal veins. Limited visualization and evaluation of calf veins bilaterally. Visualized portions of profunda femoral vein and great saphenous vein unremarkable. No filling defects to suggest DVT on grayscale or color Doppler imaging. Doppler waveforms show normal direction of venous flow, normal respiratory phasicity and response to augmentation. OTHER None. Limitations: Technologist describes technically difficult study secondary to large legs, inability to optimally position patient. IMPRESSION: No femoropopliteal DVT.  Limited calf vein evaluation. If clinical  symptoms are inconsistent or if there are persistent or worsening symptoms, further imaging (possibly involving the iliac veins) may be warranted. Electronically Signed   By: Lucrezia Europe M.D.   On: 04/17/2021 12:31   DG Foot 2 Views Left  Result Date: 04/16/2021 CLINICAL DATA:  Concern for osteomyelitis. EXAM: LEFT FOOT - 2 VIEW COMPARISON:  Right foot radiograph dated 04/16/2021. FINDINGS: There is no acute fracture or dislocation. No evidence of acute osteomyelitis by radiograph. There is diffuse subcutaneous edema and soft tissue swelling of the dorsum of the forefoot. No radiopaque foreign object or soft tissue gas. IMPRESSION: 1. No acute fracture or dislocation. No evidence of acute osteomyelitis by radiograph. 2. Soft tissue swelling of the dorsum of the forefoot. Electronically Signed   By: Anner Crete M.D.   On: 04/16/2021 23:54   DG Foot 2 Views Right  Result Date: 04/17/2021 CLINICAL DATA:  Left unable to stand EXAM: RIGHT FOOT - 2  VIEW COMPARISON:  X-ray right foot 02/14/2021. FINDINGS: Intra-articular fracture of the base of the fifth digit proximal phalanx. Query acute fracture of the fifth digit proximal phalanx head and neck. No cortical erosion or destruction. No dislocation. There is no evidence of arthropathy or other focal bone abnormality. Associated subcutaneus soft tissue edema. IMPRESSION: 1. Intra-articular fracture of the base of the fifth digit proximal phalanx. 2. Query acute fracture of the fifth digit proximal phalanx head and neck. Electronically Signed   By: Iven Finn M.D.   On: 04/17/2021 00:01    Positive ROS: All other systems have been reviewed and were otherwise negative with the exception of those mentioned in the HPI and as above.  12 point ROS was performed.  Physical Exam: General: Alert and oriented.  No apparent distress.  Vascular:  Left foot:Dorsalis Pedis:  absent Posterior Tibial:  absent  Right foot: Dorsalis Pedis:  absent Posterior  Tibial:  absent  Neuro:intact gross sensation  Derm: Multiple areas of ulceration to bilateral lower extremity.  Posterior right calf with likely venous stasis ulceration.  Pressure induced ulceration distal fifth MTPJ probes down to bone.  Blister just dorsal to this area.  Necrotic ulcer on the distal aspect of the right great toe.  Large necrotic ulceration to the left heel.  See clinical pictures. Posterior right calf   Right fifth MTPJ   Distal right great toe   Plantar left heel   Right lower extremity   Left lower extremity    Ortho/MS: Diffuse lymphedema to bilateral lower extremities.  Assessment: Peripheral vascular disease bilateral lower extremity Full-thickness ulcer with likely osteomyelitis right fifth MTPJ Necrotic heel ulceration left heel Necrotic ulceration distal left great toe Posterior right calf ulceration Lower extremity lymphedema bilaterally  Plan: Patient with obvious areas of concern for vasculopathy given nonhealing necrotic ulceration.  I have consulted vascular surgery for their opinion.  I suspect patient has osteomyelitis of the right fifth MTPJ with full-thickness ulcer probing to bone and fracture of the fifth MTPJ region but until vascular status is evaluated will defer debridement for now.  Patient also has a large posterior left heel necrotic ulceration.  Concerning for osteomyelitis.  X-rays of this region are negative.  MRI has been ordered.  Wound care has been consulted as well.  Applied multiple padded dressings to bilateral heels and right fifth MTPJ with calcium alginate with silver to the left heel and right fifth MTPJ today.  Patient at high risk of needing amputation in the future.  Concern for patient competence at this time.  Did not answer questions appropriately.    Elesa Hacker, DPM Cell [phone]   04/17/2021 3:06 PM

## 2021-04-17 NOTE — Progress Notes (Signed)
Pharmacy Antibiotic Note  [name] is a 65 y.o. female admitted on 04/16/2021 with cellulitis.  Pharmacy has been consulted for Cefepime and Vancomycin dosing x 7 days.  Plan: Cefepime 2 gm q12h per indication and renal fxn.  Vancomycin Pt ordered initial dose of Vancomycin 2 gm Vancomycin 750 mg IV Q 24 hrs.  Goal AUC 400-550. Expected AUC: 478.8 SCr used: 1.85, Vd used: 0.5, BMI > 43  Pharmacy will continue to follow and will adjust abx dosing when warranted.   Height: 5\' 5"  (165.1 cm) Weight: 122.5 kg (270 lb) IBW/kg (Calculated) : 57  Temp (24hrs), Avg:98.7 F (37.1 C), Min:98.7 F (37.1 C), Max:98.7 F (37.1 C)  Recent Labs  Lab 04/17/21 0019  WBC 18.9*  CREATININE 2.13*  LATICACIDVEN 2.0*    Estimated Creatinine Clearance: 33.7 mL/min (A) (by C-G formula based on SCr of 2.13 mg/dL (H)).    No Known Allergies  Antimicrobials this admission: 10/22 Cefepime >> x 7 days 10/22 Vancomycin >> x 7 days 10/22 Flagyl >> x 7 days  Microbiology results: 10/22 BCx: Pending  Thank you for allowing pharmacy to be a part of this patient's care.  Renda Rolls, PharmD, [facility] 04/17/2021 3:21 AM

## 2021-04-17 NOTE — Sepsis Progress Note (Signed)
Monitoring for code sepsis protocol. 

## 2021-04-17 NOTE — Progress Notes (Signed)
Brief hospitalist update note.  This is a nonbillable note.  Please see same-day H&P for full billable details.  Briefly, this is an unfortunate 65 year old female with extensive past medical history who was brought in via EMS for evaluation of bilateral lower extremity redness swelling and pain.  Apparently patient has been in her wheelchair at home unable to move for several days.  Lower extremity significant cellulitic changes.  On broad-spectrum antibiotics.  Also some question of fracture in the feet on the right lower extremity.  Podiatry was apparently consulted on admission.  Evaluation is pending.  In the meantime we will treat with broad-spectrum IV antibiotics, IV fluids, as needed pain control.  W [facility] consult requested.  Ralene Muskrat MD

## 2021-04-17 NOTE — Progress Notes (Signed)
PHARMACIST - PHYSICIAN COMMUNICATION  CONCERNING:  Enoxaparin (Lovenox) for DVT Prophylaxis    RECOMMENDATION: Patient was prescribed enoxaprin 40mg  q24 hours for VTE prophylaxis.   Filed Weights   04/16/21 2244  Weight: 122.5 kg (270 lb)    Body mass index is 44.93 kg/m.  Estimated Creatinine Clearance: 33.7 mL/min (A) (by C-G formula based on SCr of 2.13 mg/dL (H)).   Based on [place] patient is candidate for enoxaparin 0.5mg /kg TBW SQ every 24 hours based on BMI being >30.   DESCRIPTION: Pharmacy has adjusted enoxaparin dose per [facility] policy.  Patient is now receiving enoxaparin 0.5 mg/kg every 24 hours   Renda Rolls, PharmD, [facility] 04/17/2021 3:14 AM

## 2021-04-17 NOTE — Progress Notes (Signed)
CODE SEPSIS - PHARMACY COMMUNICATION  **Broad Spectrum Antibiotics should be administered within 1 hour of Sepsis diagnosis**  Time Code Sepsis Called/Page Received: 0114  Antibiotics Ordered: Cefepime and Vancomycin  Time of 1st antibiotic administration: [place], PharmD, MBA 04/17/2021 1:11 AM

## 2021-04-17 NOTE — ED Notes (Signed)
Report given to Amanda RN

## 2021-04-17 NOTE — ED Notes (Signed)
Assumed care of patient, no report received. Pt noted to have pulled out PIV.

## 2021-04-18 ENCOUNTER — Encounter: Payer: Self-pay | Admitting: Family Medicine

## 2021-04-18 DIAGNOSIS — L0390 Cellulitis, unspecified: Secondary | ICD-10-CM | POA: Diagnosis not present

## 2021-04-18 DIAGNOSIS — A419 Sepsis, unspecified organism: Secondary | ICD-10-CM | POA: Diagnosis not present

## 2021-04-18 LAB — CBC WITH DIFFERENTIAL/PLATELET
Abs Immature Granulocytes: 0.09 10*3/uL — ABNORMAL HIGH (ref 0.00–0.07)
Basophils Absolute: 0.1 10*3/uL (ref 0.0–0.1)
Basophils Relative: 1 %
Eosinophils Absolute: 0.2 10*3/uL (ref 0.0–0.5)
Eosinophils Relative: 2 %
HCT: 28.9 % — ABNORMAL LOW (ref 36.0–46.0)
Hemoglobin: 9.2 g/dL — ABNORMAL LOW (ref 12.0–15.0)
Immature Granulocytes: 1 %
Lymphocytes Relative: 7 %
Lymphs Abs: 0.7 10*3/uL (ref 0.7–4.0)
MCH: 30 pg (ref 26.0–34.0)
MCHC: 31.8 g/dL (ref 30.0–36.0)
MCV: 94.1 fL (ref 80.0–100.0)
Monocytes Absolute: 0.9 10*3/uL (ref 0.1–1.0)
Monocytes Relative: 9 %
Neutro Abs: 8.6 10*3/uL — ABNORMAL HIGH (ref 1.7–7.7)
Neutrophils Relative %: 80 %
Platelets: 270 10*3/uL (ref 150–400)
RBC: 3.07 MIL/uL — ABNORMAL LOW (ref 3.87–5.11)
RDW: 19.3 % — ABNORMAL HIGH (ref 11.5–15.5)
WBC: 10.5 10*3/uL (ref 4.0–10.5)
nRBC: 0 % (ref 0.0–0.2)

## 2021-04-18 LAB — BASIC METABOLIC PANEL
Anion gap: 5 (ref 5–15)
BUN: 47 mg/dL — ABNORMAL HIGH (ref 8–23)
CO2: 17 mmol/L — ABNORMAL LOW (ref 22–32)
Calcium: 8 mg/dL — ABNORMAL LOW (ref 8.9–10.3)
Chloride: 118 mmol/L — ABNORMAL HIGH (ref 98–111)
Creatinine, Ser: 1.37 mg/dL — ABNORMAL HIGH (ref 0.44–1.00)
GFR, Estimated: 42 mL/min — ABNORMAL LOW (ref >60)
Glucose, Bld: 192 mg/dL — ABNORMAL HIGH (ref 70–99)
Potassium: 4.1 mmol/L (ref 3.5–5.1)
Sodium: 140 mmol/L (ref 135–145)

## 2021-04-18 MED ORDER — ZINC OXIDE 40 % EX OINT
TOPICAL_OINTMENT | CUTANEOUS | Status: DC | PRN
  Filled 2021-04-18: qty 113

## 2021-04-18 MED ORDER — ENOXAPARIN SODIUM 60 MG/0.6ML IJ SOSY
0.5000 mg/kg | PREFILLED_SYRINGE | INTRAMUSCULAR | Status: DC
  Administered 2021-04-18 – 2021-05-18 (×29): 60 mg via SUBCUTANEOUS
  Filled 2021-04-18 (×29): qty 0.6

## 2021-04-18 MED ORDER — VANCOMYCIN HCL IN DEXTROSE 1-5 GM/200ML-% IV SOLN
1000.0000 mg | INTRAVENOUS | Status: DC
Start: 2021-04-18 — End: 2021-04-19
  Administered 2021-04-18 – 2021-04-19 (×2): 1000 mg via INTRAVENOUS
  Filled 2021-04-18 (×3): qty 200

## 2021-04-18 MED ORDER — ENOXAPARIN SODIUM 60 MG/0.6ML IJ SOSY: 0.5000 mg/kg | PREFILLED_SYRINGE | INTRAMUSCULAR | Status: DC

## 2021-04-18 MED ORDER — HYDRALAZINE HCL 20 MG/ML IJ SOLN: 10.0000 mg | INTRAMUSCULAR | Status: DC | PRN

## 2021-04-18 MED ORDER — IRBESARTAN 150 MG PO TABS
75.0000 mg | ORAL_TABLET | Freq: Every day | ORAL | Status: DC
  Administered 2021-04-18 – 2021-04-19 (×2): 75 mg via ORAL
  Filled 2021-04-18 (×2): qty 1

## 2021-04-18 NOTE — Progress Notes (Signed)
MRI was ordered yesterday to evaluate her left heel for possible osteomyelitis and infection.  Patient was not able to tolerate the procedure secondary to pain.  Was not able to lay on the procedure table we will order a three-phase bone scan at this time.  Wound care has seen and orders have been written.  Consult for vascular surgery has been placed and we are awaiting their evaluation as well.

## 2021-04-18 NOTE — Consult Note (Signed)
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 Referring Physician: Dr. Priscella Mann, Louanne Belton, MD/Justin Vickki Muff, D.P.M.  [name] is an 65 y.o. female.  HPI: This is a 65 year old lady with chronic history of lower extremity swelling and multiple ulcerations.  The patient has been visiting the wound care center on and off for many years.  The swelling of her lower extremities has progressively gotten worse.  She currently has dressings placed in bilateral lower extremities with multiple ulcerations.  Past Medical History:  Diagnosis Date   Diabetes mellitus without complication ([place]) 1694   Hypertension    Hypothyroidism    Kidney stone 11/2014   history of/STAGE 4 KIDNEY DISEASE PER DR Juleen China   Lymphedema    Multiple open wounds of lower leg    PT BEING SEEN BY THE WOUND CENTER FOR CHRONIC BLISTERS PER PT    Past Surgical History:  Procedure Laterality Date   TOTAL HIP ARTHROPLASTY Right 05/28/2015   Procedure: TOTAL HIP ARTHROPLASTY ANTERIOR APPROACH;  Surgeon: Hessie Knows, MD;  Location: [facility] ORS;  Service: Orthopedics;  Laterality: Right;    Family History  Problem Relation Age of Onset   Cancer Mother    Diabetes Father     Social History:  reports that she has never smoked. She has never used smokeless tobacco. She reports that she does not drink alcohol and does not use drugs.  Allergies: No Known Allergies  Medications: I have reviewed the patient's current medications. Prior to Admission:  Medications Prior to Admission  Medication Sig Dispense Refill Last Dose   acetaminophen (TYLENOL) 500 MG tablet Take 500-1,000 mg by mouth every 6 (six) hours as needed for mild pain or moderate pain.   Unknown at PRN   aspirin EC 81 MG tablet Take 1 tablet (81 mg total) by mouth daily. 90 tablet 3    atorvastatin (LIPITOR) 20 MG tablet Take 1 tablet (20 mg total) by mouth daily. 90 tablet 1 Past  Week at Unknown   blood glucose meter kit and supplies 1 each by Other route daily. ONE TOUCH ULTRA METER. Use as directed. E11.29 1 each 0    cloNIDine (CATAPRES) 0.3 MG tablet TAKE ONE TABLET BY MOUTH 3 TIMES DAILY. 270 tablet 1 Past Week at Unknown   furosemide (LASIX) 40 MG tablet Take 1 tablet (40 mg total) by mouth 2 (two) times daily. 180 tablet 1 Past Week at Unknown   gabapentin (NEURONTIN) 300 MG capsule Take 1 capsule (300 mg total) by mouth 3 (three) times daily as needed (pain). (Patient taking differently: Take 300 mg by mouth at bedtime.) 20 capsule 0 Past Week at Unknown   glipiZIDE (GLUCOTROL) 5 MG tablet TAKE 1 TABLET BY MOUTH twice a day 180 tablet 0 Past Week at Unknown   glucose blood test strip 1 each by Other route daily. ONE TOUCH ULTRA TEST STRIPS E11.29 100 strip 3    Lancets (ONETOUCH ULTRASOFT) lancets 1 each by Other route daily. Use as instructed E11.29 100 each 3    levothyroxine (SYNTHROID) 112 MCG tablet Take 1 tablet (112 mcg total) by mouth daily before breakfast. 90 tablet 0 Past Week at Unknown   metoprolol succinate (TOPROL-XL) 50 MG 24 hr tablet Take 1 tablet (50 mg total) by mouth daily. 90 tablet 1 Past Week at Unknown   telmisartan (MICARDIS) 80 MG tablet TAKE ONE TABLET BY MOUTH EVERY DAY 90 tablet 1 Past Week at Unknown  Scheduled:  atorvastatin  20 mg Oral Daily   cloNIDine  0.3 mg Oral TID   enoxaparin (LOVENOX) injection  0.5 mg/kg Subcutaneous Q24H   gabapentin  300 mg Oral BID   hydrocerin   Topical Daily   irbesartan  75 mg Oral Daily   levothyroxine  112 mcg Oral Q0600   metoprolol succinate  50 mg Oral Q breakfast    Results for orders placed or performed during the hospital encounter of 04/16/21 (from the past 48 hour(s))  CBC with Differential     Status: Abnormal   Collection Time: 04/17/21 12:19 AM  Result Value Ref Range   WBC 18.9 (H) 4.0 - 10.5 K/uL   RBC 4.06 3.87 - 5.11 MIL/uL   Hemoglobin 12.6 12.0 - 15.0 g/dL   HCT 38.9  36.0 - 46.0 %   MCV 95.8 80.0 - 100.0 fL   MCH 31.0 26.0 - 34.0 pg   MCHC 32.4 30.0 - 36.0 g/dL   RDW 19.2 (H) 11.5 - 15.5 %   Platelets 429 (H) 150 - 400 K/uL   nRBC 0.0 0.0 - 0.2 %   Neutrophils Relative % 90 %   Neutro Abs 16.9 (H) 1.7 - 7.7 K/uL   Lymphocytes Relative 3 %   Lymphs Abs 0.7 0.7 - 4.0 K/uL   Monocytes Relative 6 %   Monocytes Absolute 1.1 (H) 0.1 - 1.0 K/uL   Eosinophils Relative 0 %   Eosinophils Absolute 0.0 0.0 - 0.5 K/uL   Basophils Relative 0 %   Basophils Absolute 0.1 0.0 - 0.1 K/uL   Immature Granulocytes 1 %   Abs Immature Granulocytes 0.16 (H) 0.00 - 0.07 K/uL    Comment: Performed at [facility], [address] [place], [place] [address]  Comprehensive metabolic panel     Status: Abnormal   Collection Time: 04/17/21 12:19 AM  Result Value Ref Range   Sodium 141 135 - 145 mmol/L   Potassium 5.6 (H) 3.5 - 5.1 mmol/L    Comment: HEMOLYSIS AT THIS LEVEL MAY AFFECT RESULT   Chloride 114 (H) 98 - 111 mmol/L   CO2 14 (L) 22 - 32 mmol/L   Glucose, Bld 120 (H) 70 - 99 mg/dL    Comment: Glucose reference range applies only to samples taken after fasting for at least 8 hours.   BUN 60 (H) 8 - 23 mg/dL   Creatinine, Ser 2.13 (H) 0.44 - 1.00 mg/dL   Calcium 9.0 8.9 - 10.3 mg/dL   Total Protein 8.6 (H) 6.5 - 8.1 g/dL   Albumin 3.2 (L) 3.5 - 5.0 g/dL   AST 25 15 - 41 U/L    Comment: HEMOLYSIS AT THIS LEVEL MAY AFFECT RESULT   ALT 15 0 - 44 U/L   Alkaline Phosphatase 74 38 - 126 U/L   Total Bilirubin 0.9 0.3 - 1.2 mg/dL    Comment: HEMOLYSIS AT THIS LEVEL MAY AFFECT RESULT   GFR, Estimated 25 (L) >60 mL/min    Comment: (NOTE) Calculated using the CKD-EPI Creatinine Equation (2021)    Anion gap 13 5 - 15    Comment: Performed at [facility], [address] [place], [place] [address]  Urinalysis, Complete w Microscopic     Status: Abnormal   Collection Time: 04/17/21 12:19 AM  Result Value Ref Range   Color, Urine YELLOW (A) YELLOW    APPearance CLEAR (A) CLEAR   Specific Gravity, Urine 1.026 1.005 - 1.030   pH 5.0 5.0 -  8.0   Glucose, UA NEGATIVE NEGATIVE mg/dL   Hgb urine dipstick SMALL (A) NEGATIVE   Bilirubin Urine NEGATIVE NEGATIVE   Ketones, ur 5 (A) NEGATIVE mg/dL   Protein, ur 30 (A) NEGATIVE mg/dL   Nitrite NEGATIVE NEGATIVE   Leukocytes,Ua NEGATIVE NEGATIVE   RBC / HPF 0-5 0 - 5 RBC/hpf   WBC, UA 0-5 0 - 5 WBC/hpf   Bacteria, UA NONE SEEN NONE SEEN   Squamous Epithelial / LPF 0-5 0 - 5   Mucus PRESENT    Hyaline Casts, UA PRESENT     Comment: Performed at [facility], [address] [place], [place] [address]  CK     Status: None   Collection Time: 04/17/21 12:19 AM  Result Value Ref Range   Total CK 156 38 - 234 U/L    Comment: HEMOLYSIS AT THIS LEVEL MAY AFFECT RESULT Performed at [facility], [address] [place], [place] [address]   Lactic acid, plasma     Status: Abnormal   Collection Time: 04/17/21 12:19 AM  Result Value Ref Range   Lactic Acid, Venous 2.0 (HH) 0.5 - 1.9 mmol/L    Comment: CRITICAL RESULT CALLED TO, READ BACK BY AND VERIFIED WITH AMANDA NIVALA AT 0206 04/17/2021 DLB Performed at [facility], [address] [place], [place] [address]   Protime-INR     Status: Abnormal   Collection Time: 04/17/21 12:19 AM  Result Value Ref Range   Prothrombin Time 16.4 (H) 11.4 - 15.2 seconds   INR 1.3 (H) 0.8 - 1.2    Comment: (NOTE) INR goal varies based on device and disease states. Performed at [facility], [address] [place], [place] [address]   APTT     Status: None   Collection Time: 04/17/21 12:19 AM  Result Value Ref Range   aPTT 24 24 - 36 seconds    Comment: Performed at [facility], [address] [place], [place] [address]  Blood culture (routine x 2)     Status: None (Preliminary result)   Collection Time: 04/17/21 12:19 AM   Specimen: BLOOD  Result Value Ref Range   Specimen Description BLOOD RHAND     Special Requests      BOTTLES DRAWN AEROBIC AND ANAEROBIC Blood Culture adequate volume   Culture      NO GROWTH 1 DAY Performed at [facility], [address], [place], [place] [address]    Report Status PENDING   Magnesium     Status: None   Collection Time: 04/17/21 12:19 AM  Result Value Ref Range   Magnesium 2.4 1.7 - 2.4 mg/dL    Comment: Performed at [facility], [address] [place], [place] [address]  Brain natriuretic peptide     Status: Abnormal   Collection Time: 04/17/21 12:19 AM  Result Value Ref Range   B Natriuretic Peptide 104.8 (H) 0.0 - 100.0 pg/mL    Comment: Performed at [facility], [address] [place], [place] [address]  Resp Panel by RT-PCR (Flu A&B, Covid) Nasopharyngeal Swab     Status: None   Collection Time: 04/17/21  1:34 AM   Specimen: Nasopharyngeal Swab; Nasopharyngeal(NP) swabs in vial transport medium  Result Value Ref Range   SARS Coronavirus 2 by RT PCR NEGATIVE NEGATIVE    Comment: (NOTE) SARS-CoV-2 target nucleic acids are NOT DETECTED.  The SARS-CoV-2 RNA is generally detectable in upper respiratory specimens during the acute phase of infection.  The lowest concentration of SARS-CoV-2 viral copies this assay can detect is 138 copies/mL. A negative result does not preclude SARS-Cov-2 infection and should not be used as the sole basis for treatment or other patient management decisions. A negative result may occur with  improper specimen collection/handling, submission of specimen other than nasopharyngeal swab, presence of viral mutation(s) within the areas targeted by this assay, and inadequate number of viral copies(<138 copies/mL). A negative result must be combined with clinical observations, patient history, and epidemiological information. The expected result is Negative.  Fact Sheet for Patients:  EntrepreneurPulse.com.au  Fact Sheet for Healthcare Providers:   IncredibleEmployment.be  This test is no t yet approved or cleared by the [place] FDA and  has been authorized for detection and/or diagnosis of SARS-CoV-2 by FDA under an Emergency Use Authorization (EUA). This EUA will remain  in effect (meaning this test can be used) for the duration of the COVID-19 declaration under Section 564(b)(1) of the Act, 21 U.S.C.section 360bbb-3(b)(1), unless the authorization is terminated  or revoked sooner.       Influenza A by PCR NEGATIVE NEGATIVE   Influenza B by PCR NEGATIVE NEGATIVE    Comment: (NOTE) The Xpert Xpress SARS-CoV-2/FLU/RSV plus assay is intended as an aid in the diagnosis of influenza from Nasopharyngeal swab specimens and should not be used as a sole basis for treatment. Nasal washings and aspirates are unacceptable for Xpert Xpress SARS-CoV-2/FLU/RSV testing.  Fact Sheet for Patients: EntrepreneurPulse.com.au  Fact Sheet for Healthcare Providers: IncredibleEmployment.be  This test is not yet approved or cleared by the [place] FDA and has been authorized for detection and/or diagnosis of SARS-CoV-2 by FDA under an Emergency Use Authorization (EUA). This EUA will remain in effect (meaning this test can be used) for the duration of the COVID-19 declaration under Section 564(b)(1) of the Act, 21 U.S.C. section 360bbb-3(b)(1), unless the authorization is terminated or revoked.  Performed at [facility], [address] [place], [place] [address]   Blood culture (routine x 2)     Status: None (Preliminary result)   Collection Time: 04/17/21  2:25 AM   Specimen: BLOOD  Result Value Ref Range   Specimen Description BLOOD BLOOD LEFT HAND    Special Requests      BOTTLES DRAWN AEROBIC AND ANAEROBIC Blood Culture adequate volume   Culture      NO GROWTH 1 DAY Performed at [facility], [address] [place], [place] [address]    Report  Status PENDING   Sedimentation rate     Status: Abnormal   Collection Time: 04/17/21  2:25 AM  Result Value Ref Range   Sed Rate 118 (H) 0 - 30 mm/hr    Comment: Performed at [facility], [address] [place], [place] [address]  HIV Antibody (routine testing w rflx)     Status: None   Collection Time: 04/17/21  4:36 AM  Result Value Ref Range   HIV Screen 4th Generation wRfx Non Reactive Non Reactive    Comment: Performed at [facility], [address], [place], [place] [address]  Protime-INR     Status: Abnormal   Collection Time: 04/17/21  4:36 AM  Result Value Ref Range   Prothrombin Time 17.4 (H) 11.4 - 15.2 seconds   INR 1.4 (H) 0.8 - 1.2    Comment: (NOTE) INR goal varies based on device and disease states. Performed at [facility], [address], [place], [place] [address]  Cortisol-am, blood     Status: Abnormal   Collection Time: 04/17/21  4:36 AM  Result Value Ref Range   Cortisol - AM 36.5 (H) 6.7 - 22.6 ug/dL    Comment: Performed at [facility], [place] [address], [place], [place] [address]  Procalcitonin     Status: None   Collection Time: 04/17/21  4:36 AM  Result Value Ref Range   Procalcitonin 0.73 ng/mL    Comment:        Interpretation: PCT > 0.5 ng/mL and <= 2 ng/mL: Systemic infection (sepsis) is possible, but other conditions are known to elevate PCT as well. (NOTE)       Sepsis PCT Algorithm           Lower Respiratory Tract                                      Infection PCT Algorithm    ----------------------------     ----------------------------         PCT < 0.25 ng/mL                PCT < 0.10 ng/mL          Strongly encourage             Strongly discourage   discontinuation of antibiotics    initiation of antibiotics    ----------------------------     -----------------------------       PCT 0.25 - 0.50 ng/mL            PCT 0.10 - 0.25 ng/mL               OR       >80% decrease in PCT             Discourage initiation of                                            antibiotics      Encourage discontinuation           of antibiotics    ----------------------------     -----------------------------         PCT >= 0.50 ng/mL              PCT 0.26 - 0.50 ng/mL                AND       <80% decrease in PCT             Encourage initiation of                                             antibiotics       Encourage continuation           of antibiotics    ----------------------------     -----------------------------        PCT >= 0.50 ng/mL                  PCT > 0.50 ng/mL               AND         increase in PCT  Strongly encourage                                      initiation of antibiotics    Strongly encourage escalation           of antibiotics                                     -----------------------------                                           PCT <= 0.25 ng/mL                                                 OR                                        > 80% decrease in PCT                                      Discontinue / Do not initiate                                             antibiotics  Performed at [facility], [address] [place], [place] [address]   Basic metabolic panel     Status: Abnormal   Collection Time: 04/17/21  4:36 AM  Result Value Ref Range   Sodium 142 135 - 145 mmol/L   Potassium 4.6 3.5 - 5.1 mmol/L   Chloride 118 (H) 98 - 111 mmol/L   CO2 17 (L) 22 - 32 mmol/L   Glucose, Bld 118 (H) 70 - 99 mg/dL    Comment: Glucose reference range applies only to samples taken after fasting for at least 8 hours.   BUN 58 (H) 8 - 23 mg/dL   Creatinine, Ser 1.85 (H) 0.44 - 1.00 mg/dL   Calcium 8.7 (L) 8.9 - 10.3 mg/dL   GFR, Estimated 30 (L) >60 mL/min    Comment: (NOTE) Calculated using the CKD-EPI Creatinine Equation (2021)    Anion gap 7 5 - 15    Comment: Performed at [facility], [address]  [place],  [address]  CBC     Status: Abnormal   Collection Time: 04/17/21  4:36 AM  Result Value Ref Range   WBC 18.5 (H) 4.0 - 10.5 K/uL   RBC 3.58 (L) 3.87 - 5.11 MIL/uL   Hemoglobin 11.2 (L) 12.0 - 15.0 g/dL   HCT 34.5 (L) 36.0 - 46.0 %   MCV 96.4 80.0 - 100.0 fL   MCH 31.3 26.0 - 34.0 pg   MCHC 32.5 30.0 - 36.0 g/dL   RDW 19.3 (H) 11.5 - 15.5 %   Platelets 372 150 - 400 K/uL   nRBC 0.0 0.0 -  0.2 %    Comment: Performed at [facility], [address] [place], [place] [address]  Glucose, capillary     Status: Abnormal   Collection Time: 04/17/21  4:17 PM  Result Value Ref Range   Glucose-Capillary 156 (H) 70 - 99 mg/dL    Comment: Glucose reference range applies only to samples taken after fasting for at least 8 hours.   Comment 1 Notify RN    Comment 2 Document in Chart   CBC with Differential/Platelet     Status: Abnormal   Collection Time: 04/18/21  5:25 AM  Result Value Ref Range   WBC 10.5 4.0 - 10.5 K/uL   RBC 3.07 (L) 3.87 - 5.11 MIL/uL   Hemoglobin 9.2 (L) 12.0 - 15.0 g/dL   HCT 28.9 (L) 36.0 - 46.0 %   MCV 94.1 80.0 - 100.0 fL   MCH 30.0 26.0 - 34.0 pg   MCHC 31.8 30.0 - 36.0 g/dL   RDW 19.3 (H) 11.5 - 15.5 %   Platelets 270 150 - 400 K/uL   nRBC 0.0 0.0 - 0.2 %   Neutrophils Relative % 80 %   Neutro Abs 8.6 (H) 1.7 - 7.7 K/uL   Lymphocytes Relative 7 %   Lymphs Abs 0.7 0.7 - 4.0 K/uL   Monocytes Relative 9 %   Monocytes Absolute 0.9 0.1 - 1.0 K/uL   Eosinophils Relative 2 %   Eosinophils Absolute 0.2 0.0 - 0.5 K/uL   Basophils Relative 1 %   Basophils Absolute 0.1 0.0 - 0.1 K/uL   Immature Granulocytes 1 %   Abs Immature Granulocytes 0.09 (H) 0.00 - 0.07 K/uL    Comment: Performed at [facility], [address] [place], [place] [address]  Basic metabolic panel     Status: Abnormal   Collection Time: 04/18/21  5:25 AM  Result Value Ref Range   Sodium 140 135 - 145 mmol/L   Potassium 4.1 3.5 - 5.1 mmol/L   Chloride 118 (H) 98 -  111 mmol/L   CO2 17 (L) 22 - 32 mmol/L   Glucose, Bld 192 (H) 70 - 99 mg/dL    Comment: Glucose reference range applies only to samples taken after fasting for at least 8 hours.   BUN 47 (H) 8 - 23 mg/dL   Creatinine, Ser 1.37 (H) 0.44 - 1.00 mg/dL   Calcium 8.0 (L) 8.9 - 10.3 mg/dL   GFR, Estimated 42 (L) >60 mL/min    Comment: (NOTE) Calculated using the CKD-EPI Creatinine Equation (2021)    Anion gap 5 5 - 15    Comment: Performed at [facility], [address] [place], [place] [address]    US Venous Img Lower Bilateral (DVT)  Result Date: 04/17/2021 CLINICAL DATA:  Bilateral pain and swelling EXAM: BILATERAL LOWER EXTREMITY VENOUS DOPPLER ULTRASOUND TECHNIQUE: Gray-scale sonography with compression, as well as color and duplex ultrasound, were performed to evaluate the deep venous system(s) from the level of the common femoral vein through the popliteal and proximal calf veins. COMPARISON:  None. FINDINGS: VENOUS Normal compressibility of the common femoral, superficial femoral, and popliteal veins. Limited visualization and evaluation of calf veins bilaterally. Visualized portions of profunda femoral vein and great saphenous vein unremarkable. No filling defects to suggest DVT on grayscale or color Doppler imaging. Doppler waveforms show normal direction of venous flow, normal respiratory phasicity and response to augmentation. OTHER None. Limitations: Technologist describes technically difficult study secondary to large legs, inability to optimally position  patient. IMPRESSION: No femoropopliteal DVT.  Limited calf vein evaluation. If clinical symptoms are inconsistent or if there are persistent or worsening symptoms, further imaging (possibly involving the iliac veins) may be warranted. Electronically Signed   By: Lucrezia Europe M.D.   On: 04/17/2021 12:31   DG Foot 2 Views Left  Result Date: 04/16/2021 CLINICAL DATA:  Concern for osteomyelitis. EXAM: LEFT FOOT - 2 VIEW  COMPARISON:  Right foot radiograph dated 04/16/2021. FINDINGS: There is no acute fracture or dislocation. No evidence of acute osteomyelitis by radiograph. There is diffuse subcutaneous edema and soft tissue swelling of the dorsum of the forefoot. No radiopaque foreign object or soft tissue gas. IMPRESSION: 1. No acute fracture or dislocation. No evidence of acute osteomyelitis by radiograph. 2. Soft tissue swelling of the dorsum of the forefoot. Electronically Signed   By: Anner Crete M.D.   On: 04/16/2021 23:54   DG Foot 2 Views Right  Result Date: 04/17/2021 CLINICAL DATA:  Left unable to stand EXAM: RIGHT FOOT - 2 VIEW COMPARISON:  X-ray right foot 02/14/2021. FINDINGS: Intra-articular fracture of the base of the fifth digit proximal phalanx. Query acute fracture of the fifth digit proximal phalanx head and neck. No cortical erosion or destruction. No dislocation. There is no evidence of arthropathy or other focal bone abnormality. Associated subcutaneus soft tissue edema. IMPRESSION: 1. Intra-articular fracture of the base of the fifth digit proximal phalanx. 2. Query acute fracture of the fifth digit proximal phalanx head and neck. Electronically Signed   By: Iven Finn M.D.   On: 04/17/2021 00:01    Review of Systems  All other systems reviewed and are negative. Blood pressure (!) 156/68, pulse 97, temperature 98.3 F (36.8 C), temperature source Oral, resp. rate 17, height _0  (1.651 m), weight 118.5 kg, SpO2 98 %. Physical Exam Constitutional:      General: She is not in acute distress.    Appearance: Normal appearance. She is obese. She is not ill-appearing, toxic-appearing or diaphoretic.  HENT:     Head: Normocephalic and atraumatic.     Nose: Nose normal.  Eyes:     Extraocular Movements: Extraocular movements intact.     Conjunctiva/sclera: Conjunctivae normal.     Pupils: Pupils are equal, round, and reactive to light.  Neck:     Vascular: No carotid bruit.   Cardiovascular:     Rate and Rhythm: Normal rate and regular rhythm.  Pulmonary:     Effort: Pulmonary effort is normal. No respiratory distress.     Breath sounds: Normal breath sounds.  Abdominal:     General: Bowel sounds are normal. There is no distension.     Palpations: Abdomen is soft.     Tenderness: There is no abdominal tenderness. There is no guarding.  Musculoskeletal:        General: Swelling present. Normal range of motion.     Cervical back: Normal range of motion and neck supple. No rigidity.     Right lower leg: Edema present.     Left lower leg: Edema present.     Comments: Multiphasic Doppler signals noted over dorsalis pedis and posterior tibial.  Notable lesions are noted over the toes and the lower extremities bilaterally.  Clean dressings are noted without any foul smell.  2-3+ pitting edema noted bilaterally.  Lymphadenopathy:     Cervical: No cervical adenopathy.  Skin:    General: Skin is warm and dry.     Capillary Refill: Capillary refill takes less  than 2 seconds.     Findings: Lesion present.  Neurological:     General: No focal deficit present.     Mental Status: She is alert. Mental status is at baseline.    Assessment/Plan: This is a 65 year old lady with chronic venous insufficiency/ Mild peripheral arterial occlusive disease and chronic venous hypertension in bilateral lower extremities. I will recommend CT venography I would also recommend leg elevation. Continue with local wound care.  Elmore Guise 04/18/2021, 1:37 PM

## 2021-04-18 NOTE — TOC CM/SW Note (Addendum)
Patient with poor living conditions. Per rounds, APS may need to be contacted. CSW left handoff for TOC to follow up with weekday APS tomorrow. Patient not discharging today.   PT/OT pending.  Oleh Genin, [place]

## 2021-04-18 NOTE — Progress Notes (Signed)
PROGRESS NOTE    [name]  BBC:[id number] DOB: 12/01/1955 DOA: 04/16/2021 PCP: Juline Patch, MD    Brief Narrative:  unfortunate 65 year old female with extensive past medical history who was brought in via EMS for evaluation of bilateral lower extremity redness swelling and pain.  Apparently patient has been in her wheelchair at home unable to move for several days.  Lower extremity significant cellulitic changes.  On broad-spectrum antibiotics.  Also some question of fracture in the feet on the right lower extremity.    Podiatry consulted on admission, recommendations appreciated.  Concern regarding vascular supply lower extremities.  Podiatry apparently reached out to vascular surgery for consultation prior to any surgical intervention.  Awaiting vascular evaluation to assess lower extremity vasculopathy.   Patient remains hemodynamically stable.  Remains on broad-spectrum antibiotics, IV fluids, as needed pain control.  Appreciate WOC consult.   Assessment & Plan:   Active Problems:   Sepsis due to cellulitis (HCC)   Pressure injury of skin  Severe bilateral lower extremity cellulitis, nonpurulent Sepsis secondary to above Sepsis criteria met with tachycardia and leukocytosis.  Presumed source bilateral lower extremity nonpurulent cellulitis Plan: Continue broad-spectrum antibiotics for today IV fluids Pain management as needed Podiatry and vascular surgery follow-up May need surgical intervention but will require assessment of vasculature prior to that time  Acute kidney injury on chronic kidney disease stage IIIb Unclear etiology ATN versus prerenal azotemia Continue IV fluid hydration Can restart home telmisartan (auto substitute for Avapro)  Hyperkalemia Improved  Hypothyroidism PTA Synthroid  Type 2 diabetes mellitus with peripheral neuropathy Oral medications on hold Sliding scale coverage Modified diet Continue Neurontin  Essential  hypertension PTA clonidine and Toprol-XL Telmisartan not on formulary, I will substitute Avapro IV hydralazine as needed  Morbid obesity BMI 50.38 This complicates overall care and prognosis Nutrition consult  Poor self-care TOC consult   DVT prophylaxis: SQ Lovenox Code Status: Full Family Communication: None today Disposition Plan: Status is: Inpatient  Remains inpatient appropriate because: Sepsis secondary to bilateral lower extremity cellulitis.  Disposition plan pending.       Level of care: Med-Surg  Consultants:  Podiatry Vascular surgery  Procedures:  None  Antimicrobials: Vancomycin Cefepime Flagyl   Subjective: Seen and examined.  Sleepy this morning.  Only complaint is pain in bilateral lower extremities  Objective: Vitals:   04/17/21 1854 04/17/21 1955 04/18/21 0408 04/18/21 0919  BP: (!) 159/55 (!) 122/44 (!) 131/58 (!) 156/68  Pulse: 81 71 76 97  Resp: _0 Temp: 97.9 F (36.6 C) 98.5 F (36.9 C) 98.9 F (37.2 C) 98.3 F (36.8 C)  TempSrc: Oral Oral Oral Oral  SpO2: 100% 100% 98% 98%  Weight:      Height:        Intake/Output Summary (Last 24 hours) at 04/18/2021 0944 Last data filed at 04/18/2021 0533 Gross per 24 hour  Intake 1841.33 ml  Output 1100 ml  Net 741.33 ml   Filed Weights   04/16/21 2244 04/17/21 0403  Weight: 122.5 kg 118.5 kg    Examination:  General exam: Sleeping.  No acute distress Respiratory system: Bibasilar crackles.  Normal work of breathing.  Room air Cardiovascular system: S1-S2, RRR, no murmurs, bilateral nonpitting edema Gastrointestinal system: Obese, NT/ND, normal bowel sounds Central nervous system: Sleepy, oriented x2, no focal deficits Extremities: Diffusely decreased power symmetrically, nonpalpable pedal pulses Skin: Bilateral lower extremity lichenification.  Nonpurulent cellulitis.  Erythematous Psychiatry: Judgement and insight appear impaired. Mood &  affect flattened.      Data Reviewed: I have personally reviewed following labs and imaging studies  CBC: Recent Labs  Lab 04/17/21 0019 04/17/21 0436 04/18/21 0525  WBC 18.9* 18.5* 10.5  NEUTROABS 16.9*  --  8.6*  HGB 12.6 11.2* 9.2*  HCT 38.9 34.5* 28.9*  MCV 95.8 96.4 94.1  PLT 429* 372 950   Basic Metabolic Panel: Recent Labs  Lab 04/17/21 0019 04/17/21 0436 04/18/21 0525  NA 141 142 140  K 5.6* 4.6 4.1  CL 114* 118* 118*  CO2 14* 17* 17*  GLUCOSE 120* 118* 192*  BUN 60* 58* 47*  CREATININE 2.13* 1.85* 1.37*  CALCIUM 9.0 8.7* 8.0*  MG 2.4  --   --    GFR: Estimated Creatinine Clearance: 51.3 mL/min (A) (by C-G formula based on SCr of 1.37 mg/dL (H)). Liver Function Tests: Recent Labs  Lab 04/17/21 0019  AST 25  ALT 15  ALKPHOS 74  BILITOT 0.9  PROT 8.6*  ALBUMIN 3.2*   No results for input(s): LIPASE, AMYLASE in the last 168 hours. No results for input(s): AMMONIA in the last 168 hours. Coagulation Profile: Recent Labs  Lab 04/17/21 0019 04/17/21 0436  INR 1.3* 1.4*   Cardiac Enzymes: Recent Labs  Lab 04/17/21 0019  CKTOTAL 156   BNP (last 3 results) No results for input(s): PROBNP in the last 8760 hours. HbA1C: No results for input(s): HGBA1C in the last 72 hours. CBG: Recent Labs  Lab 04/17/21 1617  GLUCAP 156*   Lipid Profile: No results for input(s): CHOL, HDL, LDLCALC, TRIG, CHOLHDL, LDLDIRECT in the last 72 hours. Thyroid Function Tests: No results for input(s): TSH, T4TOTAL, FREET4, T3FREE, THYROIDAB in the last 72 hours. Anemia Panel: No results for input(s): VITAMINB12, FOLATE, FERRITIN, TIBC, IRON, RETICCTPCT in the last 72 hours. Sepsis Labs: Recent Labs  Lab 04/17/21 0019 04/17/21 0436  PROCALCITON  --  0.73  LATICACIDVEN 2.0*  --     Recent Results (from the past 240 hour(s))  Blood culture (routine x 2)     Status: None (Preliminary result)   Collection Time: 04/17/21 12:19 AM   Specimen: BLOOD  Result Value Ref Range Status    Specimen Description BLOOD RHAND  Final   Special Requests   Final    BOTTLES DRAWN AEROBIC AND ANAEROBIC Blood Culture adequate volume   Culture   Final    NO GROWTH 1 DAY Performed at [facility], [address], [place], [place] [address]    Report Status PENDING  Incomplete  Resp Panel by RT-PCR (Flu A&B, Covid) Nasopharyngeal Swab     Status: None   Collection Time: 04/17/21  1:34 AM   Specimen: Nasopharyngeal Swab; Nasopharyngeal(NP) swabs in vial transport medium  Result Value Ref Range Status   SARS Coronavirus 2 by RT PCR NEGATIVE NEGATIVE Final    Comment: (NOTE) SARS-CoV-2 target nucleic acids are NOT DETECTED.  The SARS-CoV-2 RNA is generally detectable in upper respiratory specimens during the acute phase of infection. The lowest concentration of SARS-CoV-2 viral copies this assay can detect is 138 copies/mL. A negative result does not preclude SARS-Cov-2 infection and should not be used as the sole basis for treatment or other patient management decisions. A negative result may occur with  improper specimen collection/handling, submission of specimen other than nasopharyngeal swab, presence of viral mutation(s) within the areas targeted by this assay, and inadequate number of viral copies(<138 copies/mL). A negative result must be combined with clinical observations, patient  history, and epidemiological information. The expected result is Negative.  Fact Sheet for Patients:  EntrepreneurPulse.com.au  Fact Sheet for Healthcare Providers:  IncredibleEmployment.be  This test is no t yet approved or cleared by the [place] FDA and  has been authorized for detection and/or diagnosis of SARS-CoV-2 by FDA under an Emergency Use Authorization (EUA). This EUA will remain  in effect (meaning this test can be used) for the duration of the COVID-19 declaration under Section 564(b)(1) of the Act, 21 U.S.C.section  360bbb-3(b)(1), unless the authorization is terminated  or revoked sooner.       Influenza A by PCR NEGATIVE NEGATIVE Final   Influenza B by PCR NEGATIVE NEGATIVE Final    Comment: (NOTE) The Xpert Xpress SARS-CoV-2/FLU/RSV plus assay is intended as an aid in the diagnosis of influenza from Nasopharyngeal swab specimens and should not be used as a sole basis for treatment. Nasal washings and aspirates are unacceptable for Xpert Xpress SARS-CoV-2/FLU/RSV testing.  Fact Sheet for Patients: EntrepreneurPulse.com.au  Fact Sheet for Healthcare Providers: IncredibleEmployment.be  This test is not yet approved or cleared by the [place] FDA and has been authorized for detection and/or diagnosis of SARS-CoV-2 by FDA under an Emergency Use Authorization (EUA). This EUA will remain in effect (meaning this test can be used) for the duration of the COVID-19 declaration under Section 564(b)(1) of the Act, 21 U.S.C. section 360bbb-3(b)(1), unless the authorization is terminated or revoked.  Performed at [facility], [address] [place], [place] [address]   Blood culture (routine x 2)     Status: None (Preliminary result)   Collection Time: 04/17/21  2:25 AM   Specimen: BLOOD  Result Value Ref Range Status   Specimen Description BLOOD BLOOD LEFT HAND  Final   Special Requests   Final    BOTTLES DRAWN AEROBIC AND ANAEROBIC Blood Culture adequate volume   Culture   Final    NO GROWTH 1 DAY Performed at [facility], [address] [place], [place] [address]    Report Status PENDING  Incomplete         Radiology Studies: US Venous Img Lower Bilateral (DVT)  Result Date: 04/17/2021 CLINICAL DATA:  Bilateral pain and swelling EXAM: BILATERAL LOWER EXTREMITY VENOUS DOPPLER ULTRASOUND TECHNIQUE: Gray-scale sonography with compression, as well as color and duplex ultrasound, were performed to evaluate the deep venous  system(s) from the level of the common femoral vein through the popliteal and proximal calf veins. COMPARISON:  None. FINDINGS: VENOUS Normal compressibility of the common femoral, superficial femoral, and popliteal veins. Limited visualization and evaluation of calf veins bilaterally. Visualized portions of profunda femoral vein and great saphenous vein unremarkable. No filling defects to suggest DVT on grayscale or color Doppler imaging. Doppler waveforms show normal direction of venous flow, normal respiratory phasicity and response to augmentation. OTHER None. Limitations: Technologist describes technically difficult study secondary to large legs, inability to optimally position patient. IMPRESSION: No femoropopliteal DVT.  Limited calf vein evaluation. If clinical symptoms are inconsistent or if there are persistent or worsening symptoms, further imaging (possibly involving the iliac veins) may be warranted. Electronically Signed   By: Lucrezia Europe M.D.   On: 04/17/2021 12:31   DG Foot 2 Views Left  Result Date: 04/16/2021 CLINICAL DATA:  Concern for osteomyelitis. EXAM: LEFT FOOT - 2 VIEW COMPARISON:  Right foot radiograph dated 04/16/2021. FINDINGS: There is no acute fracture or dislocation. No evidence of acute osteomyelitis by radiograph. There is diffuse subcutaneous edema  and soft tissue swelling of the dorsum of the forefoot. No radiopaque foreign object or soft tissue gas. IMPRESSION: 1. No acute fracture or dislocation. No evidence of acute osteomyelitis by radiograph. 2. Soft tissue swelling of the dorsum of the forefoot. Electronically Signed   By: Anner Crete M.D.   On: 04/16/2021 23:54   DG Foot 2 Views Right  Result Date: 04/17/2021 CLINICAL DATA:  Left unable to stand EXAM: RIGHT FOOT - 2 VIEW COMPARISON:  X-ray right foot 02/14/2021. FINDINGS: Intra-articular fracture of the base of the fifth digit proximal phalanx. Query acute fracture of the fifth digit proximal phalanx head and  neck. No cortical erosion or destruction. No dislocation. There is no evidence of arthropathy or other focal bone abnormality. Associated subcutaneus soft tissue edema. IMPRESSION: 1. Intra-articular fracture of the base of the fifth digit proximal phalanx. 2. Query acute fracture of the fifth digit proximal phalanx head and neck. Electronically Signed   By: Iven Finn M.D.   On: 04/17/2021 00:01        Scheduled Meds:  atorvastatin  20 mg Oral Daily   cloNIDine  0.3 mg Oral TID   enoxaparin (LOVENOX) injection  0.5 mg/kg Subcutaneous Q24H   gabapentin  300 mg Oral BID   hydrocerin   Topical Daily   levothyroxine  112 mcg Oral Q0600   metoprolol succinate  50 mg Oral Q breakfast   Continuous Infusions:  ceFEPime (MAXIPIME) IV Stopped (04/17/21 2200)   metronidazole 500 mg (04/18/21 0533)   vancomycin Stopped (04/17/21 1115)     LOS: 1 day    Time spent: 35 minutes    Sidney Ace, MD [facility]   If 7PM-7AM, please contact night-coverage  04/18/2021, 9:44 AM

## 2021-04-18 NOTE — Progress Notes (Signed)
Air mattress ordered for pt. I was told it will arrive late tonight or tomorrow.

## 2021-04-18 NOTE — Progress Notes (Signed)
Pharmacy Antibiotic Note  [name] is a 65 y.o. female admitted on 04/16/2021 with cellulitis.  Pharmacy has been consulted for Cefepime and Vancomycin dosing x 7 days.  Plan: Cefepime 2 gm q12h per indication and Crcl 51.3 ml/min  Vancomycin Pt ordered initial dose of Vancomycin 2 gm Vancomycin 750 mg IV Q 24 hrs.  Goal AUC 400-550. Expected AUC: 478.8 SCr used: 1.85, Vd used: 0.5, BMI > 43  Pharmacy will continue to follow and will adjust abx dosing when warranted.  10/23:  Scr improved 1.85>>1.37 Will adjust Vancomycin to 1000 mg IV Q 24 hrs. Goal AUC 400-550. Expected AUC: 494 SCr used: 1.37 Cmin 13.7 Vd 0.5  (BMi>30)      Height: 5\' 5"  (165.1 cm) Weight: 118.5 kg (261 lb 3.9 oz) IBW/kg (Calculated) : 57  Temp (24hrs), Avg:98.4 F (36.9 C), Min:97.8 F (36.6 C), Max:98.9 F (37.2 C)  Recent Labs  Lab 04/17/21 0019 04/17/21 0436 04/18/21 0525  WBC 18.9* 18.5* 10.5  CREATININE 2.13* 1.85* 1.37*  LATICACIDVEN 2.0*  --   --      Estimated Creatinine Clearance: 51.3 mL/min (A) (by C-G formula based on SCr of 1.37 mg/dL (H)).    No Known Allergies  Antimicrobials this admission: 10/22 Cefepime >> x 7 days 10/22 Vancomycin >> x 7 days 10/22 Flagyl >> x 7 days  Microbiology results: 10/22 BCx: Pending  Thank you for allowing pharmacy to be a part of this patient's care.  Chinita Greenland PharmD Clinical Pharmacist 04/18/2021

## 2021-04-19 ENCOUNTER — Encounter: Payer: Self-pay | Admitting: Family Medicine

## 2021-04-19 ENCOUNTER — Ambulatory Visit: Payer: Medicare HMO | Admitting: Family Medicine

## 2021-04-19 ENCOUNTER — Inpatient Hospital Stay: Payer: Medicare HMO

## 2021-04-19 DIAGNOSIS — A419 Sepsis, unspecified organism: Secondary | ICD-10-CM | POA: Diagnosis not present

## 2021-04-19 DIAGNOSIS — L97929 Non-pressure chronic ulcer of unspecified part of left lower leg with unspecified severity: Secondary | ICD-10-CM | POA: Diagnosis not present

## 2021-04-19 DIAGNOSIS — L0390 Cellulitis, unspecified: Secondary | ICD-10-CM | POA: Diagnosis not present

## 2021-04-19 LAB — CBC WITH DIFFERENTIAL/PLATELET
Abs Immature Granulocytes: 0.2 10*3/uL — ABNORMAL HIGH (ref 0.00–0.07)
Basophils Absolute: 0.1 10*3/uL (ref 0.0–0.1)
Basophils Relative: 1 %
Eosinophils Absolute: 0.3 10*3/uL (ref 0.0–0.5)
Eosinophils Relative: 3 %
HCT: 30.6 % — ABNORMAL LOW (ref 36.0–46.0)
Hemoglobin: 9.5 g/dL — ABNORMAL LOW (ref 12.0–15.0)
Immature Granulocytes: 2 %
Lymphocytes Relative: 8 %
Lymphs Abs: 0.9 10*3/uL (ref 0.7–4.0)
MCH: 29.7 pg (ref 26.0–34.0)
MCHC: 31 g/dL (ref 30.0–36.0)
MCV: 95.6 fL (ref 80.0–100.0)
Monocytes Absolute: 1.1 10*3/uL — ABNORMAL HIGH (ref 0.1–1.0)
Monocytes Relative: 10 %
Neutro Abs: 7.9 10*3/uL — ABNORMAL HIGH (ref 1.7–7.7)
Neutrophils Relative %: 76 %
Platelets: 287 10*3/uL (ref 150–400)
RBC: 3.2 MIL/uL — ABNORMAL LOW (ref 3.87–5.11)
RDW: 19.5 % — ABNORMAL HIGH (ref 11.5–15.5)
WBC: 10.3 10*3/uL (ref 4.0–10.5)
nRBC: 0 % (ref 0.0–0.2)

## 2021-04-19 LAB — BASIC METABOLIC PANEL
Anion gap: 7 (ref 5–15)
BUN: 41 mg/dL — ABNORMAL HIGH (ref 8–23)
CO2: 19 mmol/L — ABNORMAL LOW (ref 22–32)
Calcium: 8.3 mg/dL — ABNORMAL LOW (ref 8.9–10.3)
Chloride: 115 mmol/L — ABNORMAL HIGH (ref 98–111)
Creatinine, Ser: 1.57 mg/dL — ABNORMAL HIGH (ref 0.44–1.00)
GFR, Estimated: 36 mL/min — ABNORMAL LOW (ref >60)
Glucose, Bld: 206 mg/dL — ABNORMAL HIGH (ref 70–99)
Potassium: 4.4 mmol/L (ref 3.5–5.1)
Sodium: 141 mmol/L (ref 135–145)

## 2021-04-19 LAB — GLUCOSE, CAPILLARY
Glucose-Capillary: 200 mg/dL — ABNORMAL HIGH (ref 70–99)
Glucose-Capillary: 173 mg/dL — ABNORMAL HIGH (ref 70–99)

## 2021-04-19 LAB — HEMOGLOBIN A1C
Hgb A1c MFr Bld: 5.3 % (ref 4.8–5.6)
Mean Plasma Glucose: 105 mg/dL

## 2021-04-19 LAB — SURGICAL PCR SCREEN
MRSA, PCR: NEGATIVE
Staphylococcus aureus: NEGATIVE

## 2021-04-19 MED ORDER — INSULIN ASPART 100 UNIT/ML IJ SOLN
0.0000 [IU] | Freq: Every day | INTRAMUSCULAR | Status: DC
  Administered 2021-04-20: 2 [IU] via SUBCUTANEOUS
  Administered 2021-04-21: 3 [IU] via SUBCUTANEOUS
  Administered 2021-04-23 – 2021-04-30 (×3): 2 [IU] via SUBCUTANEOUS
  Filled 2021-04-19 (×5): qty 1

## 2021-04-19 MED ORDER — INSULIN ASPART 100 UNIT/ML IJ SOLN
0.0000 [IU] | Freq: Three times a day (TID) | INTRAMUSCULAR | Status: DC
  Administered 2021-04-19 – 2021-04-21 (×5): 3 [IU] via SUBCUTANEOUS
  Administered 2021-04-21 (×2): 5 [IU] via SUBCUTANEOUS
  Administered 2021-04-22 – 2021-04-24 (×7): 3 [IU] via SUBCUTANEOUS
  Administered 2021-04-24: 8 [IU] via SUBCUTANEOUS
  Administered 2021-04-24 – 2021-04-28 (×11): 3 [IU] via SUBCUTANEOUS
  Administered 2021-04-29: 2 [IU] via SUBCUTANEOUS
  Administered 2021-04-29: 3 [IU] via SUBCUTANEOUS
  Administered 2021-04-29: 5 [IU] via SUBCUTANEOUS
  Administered 2021-04-30 – 2021-05-01 (×3): 3 [IU] via SUBCUTANEOUS
  Administered 2021-05-01: 2 [IU] via SUBCUTANEOUS
  Administered 2021-05-01: 5 [IU] via SUBCUTANEOUS
  Administered 2021-05-02 – 2021-05-04 (×7): 2 [IU] via SUBCUTANEOUS
  Administered 2021-05-04: 3 [IU] via SUBCUTANEOUS
  Administered 2021-05-05: 5 [IU] via SUBCUTANEOUS
  Administered 2021-05-05 (×2): 3 [IU] via SUBCUTANEOUS
  Administered 2021-05-06 (×2): 2 [IU] via SUBCUTANEOUS
  Administered 2021-05-06 – 2021-05-07 (×2): 3 [IU] via SUBCUTANEOUS
  Administered 2021-05-07 – 2021-05-08 (×3): 2 [IU] via SUBCUTANEOUS
  Administered 2021-05-10: 3 [IU] via SUBCUTANEOUS
  Administered 2021-05-11: 2 [IU] via SUBCUTANEOUS
  Administered 2021-05-13: 12:00:00 3 [IU] via SUBCUTANEOUS
  Administered 2021-05-14: 2 [IU] via SUBCUTANEOUS
  Administered 2021-05-15: 3 [IU] via SUBCUTANEOUS
  Administered 2021-05-17: 2 [IU] via SUBCUTANEOUS
  Administered 2021-05-17: 3 [IU] via SUBCUTANEOUS
  Administered 2021-05-17: 2 [IU] via SUBCUTANEOUS
  Administered 2021-05-18: 3 [IU] via SUBCUTANEOUS
  Filled 2021-04-19 (×59): qty 1

## 2021-04-19 MED ORDER — SODIUM CHLORIDE 0.9 % IV SOLN: INTRAVENOUS | Status: DC | PRN

## 2021-04-19 MED ORDER — TECHNETIUM TC 99M MEDRONATE IV KIT
20.0000 | PACK | Freq: Once | INTRAVENOUS | Status: AC | PRN
  Administered 2021-04-19: 21.9 via INTRAVENOUS
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
## 2021-04-19 NOTE — Progress Notes (Signed)
PROGRESS NOTE    KRISTINE CHAHAL  HNT:[id number] DOB: 1956-06-25 DOA: 04/16/2021 PCP: Duanne Limerick, MD    Brief Narrative:  unfortunate 65 year old female with extensive past medical history who was brought in via EMS for evaluation of bilateral lower extremity redness swelling and pain.  Apparently patient has been in her wheelchair at home unable to move for several days.  Lower extremity significant cellulitic changes.  On broad-spectrum antibiotics.  Also some question of fracture in the feet on the right lower extremity.    Podiatry consulted on admission, recommendations appreciated.  Concern regarding vascular supply lower extremities.  Podiatry apparently reached out to vascular surgery for consultation prior to any surgical intervention.  Awaiting vascular evaluation to assess lower extremity vasculopathy.   Patient remains hemodynamically stable.  Remains on broad-spectrum antibiotics, IV fluids, as needed pain control.  Appreciate WOC consult.  Appreciate vascular evaluation and recommendations.   Assessment & Plan:   Active Problems:   Sepsis due to cellulitis (HCC)   Pressure injury of skin  Severe bilateral lower extremity cellulitis, nonpurulent Sepsis secondary to above Sepsis criteria met with tachycardia and leukocytosis.  Presumed source bilateral lower extremity nonpurulent cellulitis Concern regarding vascular supply Podiatry and vascular consulted Plan: Continue vancomycin and cefepime for now Check MRSA screen, if negative can DC Vanco DC IV fluids As needed pain management Podiatry and vascular follow-up CT venogram per vascular recommendations today Decision regarding surgical intervention for podiatry standpoint after vascular assessment  Acute kidney injury on chronic kidney disease stage IIIb Unclear etiology ATN versus prerenal azotemia Hold home telmisartan Daily renal function  Hyperkalemia Improved  Hypothyroidism PTA Synthroid  Type  2 diabetes mellitus with peripheral neuropathy Oral medications on hold Sliding scale coverage Modified diet Continue Neurontin  Essential hypertension PTA clonidine and Toprol-XL Telmisartan not on formulary, I will substitute Avapro IV hydralazine as needed  Morbid obesity BMI 43.47 This complicates overall care and prognosis Nutrition consult  Poor self-care TOC consult   DVT prophylaxis: SQ Lovenox Code Status: Full Family Communication: None today Disposition Plan: Status is: Inpatient  Remains inpatient appropriate because: Sepsis secondary to bilateral lower extremity cellulitis.  Disposition plan pending.       Level of care: Med-Surg  Consultants:  Podiatry Vascular surgery  Procedures:  None  Antimicrobials: Vancomycin Cefepime Flagyl   Subjective: Seen and examined.  Sleepy this morning.  When awakened states she is feeling a little better Objective: Vitals:   04/18/21 1719 04/18/21 2005 04/19/21 0529 04/19/21 0818  BP: (!) 116/43 (!) 140/54 (!) 126/55 (!) 156/61  Pulse: 64 67 60 64  Resp: 17 18 16 16   Temp: 98.1 F (36.7 C) 99.6 F (37.6 C) 98 F (36.7 C) 97.8 F (36.6 C)  TempSrc: Oral Oral Oral Oral  SpO2: 100% 100% 100% 100%  Weight:      Height:        Intake/Output Summary (Last 24 hours) at 04/19/2021 1052 Last data filed at 04/19/2021 1013 Gross per 24 hour  Intake 724.35 ml  Output 600 ml  Net 124.35 ml   Filed Weights   04/16/21 2244 04/17/21 0403  Weight: 122.5 kg 118.5 kg    Examination:  General exam: No acute distress.  Lethargic Respiratory system: Poor respiratory effort.  Bibasilar crackles.  Normal work of breathing.  Room air Cardiovascular system: S1-S2, RRR, no murmurs, bilateral nonpitting edema Gastrointestinal system: Obese, NT/ND, normal bowel sounds Central nervous system: Sleepy, oriented x2, no focal deficits Extremities: Diffusely decreased  power symmetrically, nonpalpable pedal pulses Skin:  Bilateral lower extremity lichenification.  Nonpurulent cellulitis.  Erythematous Psychiatry: Judgement and insight appear impaired. Mood & affect flattened.     Data Reviewed: I have personally reviewed following labs and imaging studies  CBC: Recent Labs  Lab 04/17/21 0019 04/17/21 0436 04/18/21 0525 04/19/21 0618  WBC 18.9* 18.5* 10.5 10.3  NEUTROABS 16.9*  --  8.6* 7.9*  HGB 12.6 11.2* 9.2* 9.5*  HCT 38.9 34.5* 28.9* 30.6*  MCV 95.8 96.4 94.1 95.6  PLT 429* 372 270 161   Basic Metabolic Panel: Recent Labs  Lab 04/17/21 0019 04/17/21 0436 04/18/21 0525 04/19/21 0618  NA 141 142 140 141  K 5.6* 4.6 4.1 4.4  CL 114* 118* 118* 115*  CO2 14* 17* 17* 19*  GLUCOSE 120* 118* 192* 206*  BUN 60* 58* 47* 41*  CREATININE 2.13* 1.85* 1.37* 1.57*  CALCIUM 9.0 8.7* 8.0* 8.3*  MG 2.4  --   --   --    GFR: Estimated Creatinine Clearance: 44.8 mL/min (A) (by C-G formula based on SCr of 1.57 mg/dL (H)). Liver Function Tests: Recent Labs  Lab 04/17/21 0019  AST 25  ALT 15  ALKPHOS 74  BILITOT 0.9  PROT 8.6*  ALBUMIN 3.2*   No results for input(s): LIPASE, AMYLASE in the last 168 hours. No results for input(s): AMMONIA in the last 168 hours. Coagulation Profile: Recent Labs  Lab 04/17/21 0019 04/17/21 0436  INR 1.3* 1.4*   Cardiac Enzymes: Recent Labs  Lab 04/17/21 0019  CKTOTAL 156   BNP (last 3 results) No results for input(s): PROBNP in the last 8760 hours. HbA1C: No results for input(s): HGBA1C in the last 72 hours. CBG: Recent Labs  Lab 04/17/21 1617  GLUCAP 156*   Lipid Profile: No results for input(s): CHOL, HDL, LDLCALC, TRIG, CHOLHDL, LDLDIRECT in the last 72 hours. Thyroid Function Tests: No results for input(s): TSH, T4TOTAL, FREET4, T3FREE, THYROIDAB in the last 72 hours. Anemia Panel: No results for input(s): VITAMINB12, FOLATE, FERRITIN, TIBC, IRON, RETICCTPCT in the last 72 hours. Sepsis Labs: Recent Labs  Lab 04/17/21 0019  04/17/21 0436  PROCALCITON  --  0.73  LATICACIDVEN 2.0*  --     Recent Results (from the past 240 hour(s))  Blood culture (routine x 2)     Status: None (Preliminary result)   Collection Time: 04/17/21 12:19 AM   Specimen: BLOOD  Result Value Ref Range Status   Specimen Description BLOOD RHAND  Final   Special Requests   Final    BOTTLES DRAWN AEROBIC AND ANAEROBIC Blood Culture adequate volume   Culture   Final    NO GROWTH 2 DAYS Performed at [facility], [address] [place],  [address]    Report Status PENDING  Incomplete  Resp Panel by RT-PCR (Flu A&B, Covid) Nasopharyngeal Swab     Status: None   Collection Time: 04/17/21  1:34 AM   Specimen: Nasopharyngeal Swab; Nasopharyngeal(NP) swabs in vial transport medium  Result Value Ref Range Status   SARS Coronavirus 2 by RT PCR NEGATIVE NEGATIVE Final    Comment: (NOTE) SARS-CoV-2 target nucleic acids are NOT DETECTED.  The SARS-CoV-2 RNA is generally detectable in upper respiratory specimens during the acute phase of infection. The lowest concentration of SARS-CoV-2 viral copies this assay can detect is 138 copies/mL. A negative result does not preclude SARS-Cov-2 infection and should not be used as the sole basis for treatment or other patient management decisions. A  negative result may occur with  improper specimen collection/handling, submission of specimen other than nasopharyngeal swab, presence of viral mutation(s) within the areas targeted by this assay, and inadequate number of viral copies(<138 copies/mL). A negative result must be combined with clinical observations, patient history, and epidemiological information. The expected result is Negative.  Fact Sheet for Patients:  EntrepreneurPulse.com.au  Fact Sheet for Healthcare Providers:  IncredibleEmployment.be  This test is no t yet approved or cleared by the [place] FDA and  has been authorized  for detection and/or diagnosis of SARS-CoV-2 by FDA under an Emergency Use Authorization (EUA). This EUA will remain  in effect (meaning this test can be used) for the duration of the COVID-19 declaration under Section 564(b)(1) of the Act, 21 U.S.C.section 360bbb-3(b)(1), unless the authorization is terminated  or revoked sooner.       Influenza A by PCR NEGATIVE NEGATIVE Final   Influenza B by PCR NEGATIVE NEGATIVE Final    Comment: (NOTE) The Xpert Xpress SARS-CoV-2/FLU/RSV plus assay is intended as an aid in the diagnosis of influenza from Nasopharyngeal swab specimens and should not be used as a sole basis for treatment. Nasal washings and aspirates are unacceptable for Xpert Xpress SARS-CoV-2/FLU/RSV testing.  Fact Sheet for Patients: EntrepreneurPulse.com.au  Fact Sheet for Healthcare Providers: IncredibleEmployment.be  This test is not yet approved or cleared by the [place] FDA and has been authorized for detection and/or diagnosis of SARS-CoV-2 by FDA under an Emergency Use Authorization (EUA). This EUA will remain in effect (meaning this test can be used) for the duration of the COVID-19 declaration under Section 564(b)(1) of the Act, 21 U.S.C. section 360bbb-3(b)(1), unless the authorization is terminated or revoked.  Performed at [facility], [address] [place], [place] [address]   Blood culture (routine x 2)     Status: None (Preliminary result)   Collection Time: 04/17/21  2:25 AM   Specimen: BLOOD  Result Value Ref Range Status   Specimen Description BLOOD BLOOD LEFT HAND  Final   Special Requests   Final    BOTTLES DRAWN AEROBIC AND ANAEROBIC Blood Culture adequate volume   Culture   Final    NO GROWTH 2 DAYS Performed at [facility], [address] [place], [place] [address]    Report Status PENDING  Incomplete         Radiology Studies: US Venous Img Lower Bilateral  (DVT)  Result Date: 04/17/2021 CLINICAL DATA:  Bilateral pain and swelling EXAM: BILATERAL LOWER EXTREMITY VENOUS DOPPLER ULTRASOUND TECHNIQUE: Gray-scale sonography with compression, as well as color and duplex ultrasound, were performed to evaluate the deep venous system(s) from the level of the common femoral vein through the popliteal and proximal calf veins. COMPARISON:  None. FINDINGS: VENOUS Normal compressibility of the common femoral, superficial femoral, and popliteal veins. Limited visualization and evaluation of calf veins bilaterally. Visualized portions of profunda femoral vein and great saphenous vein unremarkable. No filling defects to suggest DVT on grayscale or color Doppler imaging. Doppler waveforms show normal direction of venous flow, normal respiratory phasicity and response to augmentation. OTHER None. Limitations: Technologist describes technically difficult study secondary to large legs, inability to optimally position patient. IMPRESSION: No femoropopliteal DVT.  Limited calf vein evaluation. If clinical symptoms are inconsistent or if there are persistent or worsening symptoms, further imaging (possibly involving the iliac veins) may be warranted. Electronically Signed   By: Lucrezia Europe M.D.   On: 04/17/2021 12:31  Scheduled Meds:  atorvastatin  20 mg Oral Daily   cloNIDine  0.3 mg Oral TID   enoxaparin (LOVENOX) injection  0.5 mg/kg Subcutaneous Q24H   gabapentin  300 mg Oral BID   hydrocerin   Topical Daily   irbesartan  75 mg Oral Daily   levothyroxine  112 mcg Oral Q0600   metoprolol succinate  50 mg Oral Q breakfast   Continuous Infusions:  sodium chloride Stopped (04/19/21 0506)   ceFEPime (MAXIPIME) IV 2 g (04/19/21 0950)   metronidazole 100 mL/hr at 04/19/21 0606   vancomycin Stopped (04/18/21 1343)     LOS: 2 days    Time spent: 25 minutes    Sidney Ace, MD [facility]   If 7PM-7AM, please contact  night-coverage  04/19/2021, 10:52 AM

## 2021-04-19 NOTE — Progress Notes (Addendum)
PT Cancellation Note  Patient Details Name: YZABELLA CRUNK MRN: [id number] DOB: 1955-10-11   Cancelled Treatment:    Reason Eval/Treat Not Completed: Patient at procedure or test/unavailable.  PT order received and chart reviewed.  PT attempted to see pt, however pt was off the floor with nuclear medicine.  Therapist will attempt to evaluate pt at later date/time as medically appropriate.   Gwenlyn Saran, PT, DPT 04/19/21, 3:55 PM

## 2021-04-19 NOTE — Progress Notes (Signed)
OT Cancellation Note  Patient Details Name: [name] MRN: [id number] DOB: [birth year]   Cancelled Treatment:    Reason Eval/Treat Not Completed: Patient at procedure or test/ unavailable. Order received and chart review. Pt currently off floor for bone scan. OT to re-attempt at later time/date as able.   Fredirick Maudlin, OTR/L [place]

## 2021-04-19 NOTE — Progress Notes (Signed)
[facility] Daily Progress Note  Subjective: Patient without complaint this AM.  No acute issues overnight.  Objective: Vitals:   04/18/21 1719 04/18/21 2005 04/19/21 0529 04/19/21 0818  BP: (!) 116/43 (!) 140/54 (!) 126/55 (!) 156/61  Pulse: 64 67 60 64  Resp: 17 18 16 16   Temp: 98.1 F (36.7 C) 99.6 F (37.6 C) 98 F (36.7 C) 97.8 F (36.6 C)  TempSrc: Oral Oral Oral Oral  SpO2: 100% 100% 100% 100%  Weight:      Height:        Intake/Output Summary (Last 24 hours) at 04/19/2021 1334 Last data filed at 04/19/2021 1013 Gross per 24 hour  Intake 724.35 ml  Output 600 ml  Net 124.35 ml   Physical Exam: A&Ox3, NAD CV: RRR Pulmonary: CTA Bilaterally Abdomen: Soft, Nontender, Nondistended Vascular:  Right lower extremity: Thigh soft.  Calf soft.  Moderate edema.  Wounds currently are dressed with dressings that are clean and dry.  Left lower extremity: Thigh soft.  Calf soft.  Moderate edema.  Wounds currently are dressed with dressings that are clean and dry.   Laboratory: CBC    Component Value Date/Time   WBC 10.3 04/19/2021 0618   HGB 9.5 (L) 04/19/2021 0618   HGB 12.5 02/20/2012 0737   HCT 30.6 (L) 04/19/2021 0618   HCT 37.6 02/20/2012 0737   PLT 287 04/19/2021 0618   PLT 230 02/20/2012 0737   BMET    Component Value Date/Time   NA 141 04/19/2021 0618   NA 137 10/22/2020 1450   NA 139 02/20/2012 0737   K 4.4 04/19/2021 0618   K 4.7 02/20/2012 0737   CL 115 (H) 04/19/2021 0618   CL 103 02/20/2012 0737   CO2 19 (L) 04/19/2021 0618   CO2 27 02/20/2012 0737   GLUCOSE 206 (H) 04/19/2021 0618   GLUCOSE 123 (H) 02/20/2012 0737   BUN 41 (H) 04/19/2021 0618   BUN 27 10/22/2020 1450   BUN 26 (H) 02/20/2012 0737   CREATININE 1.57 (H) 04/19/2021 0618   CREATININE 1.31 (H) 02/20/2012 0737   CALCIUM 8.3 (L) 04/19/2021 0618   CALCIUM 9.3 02/20/2012 0737   GFRNONAA 36 (L) 04/19/2021 0618   GFRNONAA 45 (L) 02/20/2012 0737   GFRAA 39 (L)  04/30/2020 1408   GFRAA 52 (L) 02/20/2012 0737   Assessment/Planning: The patient is a 65 year old female who presents with severe lymphedema versus venous insufficiency to the bilateral legs  1) Rule out atherosclerotic disease: Patient with multiple risk factors for atherosclerotic disease Nonhealing ulceration to the bilateral legs Able to faintly Doppler pedal pulses bilaterally however recommend the patient undergo an ABI and attempt to assess the patency of her arterial system. Having an updated ABI will also be helpful in moving forward with doing wrap therapy  2) Lymphedema versus venous insufficiency: Daily a contributing component Patient does not engage in conservative therapy including wearing medical grade 1 compression socks, elevation of her extremities remaining active Patient will need a formal work-up possibly laser ablation lymphedema pump in the future Once ABI results are normal would recommend starting with minor wrap therapy to gain control of the situation Patient is in agreement with plan  3) Type 2 diabetes: On appropriate medication. Encouraged good control as its slows the progression of atherosclerotic disease.  Discussed with Dr. Eber Hong Waseem Suess PA-C 04/19/2021 1:34 PM

## 2021-04-20 ENCOUNTER — Ambulatory Visit: Payer: Medicare HMO | Admitting: Family Medicine

## 2021-04-20 ENCOUNTER — Inpatient Hospital Stay: Payer: Medicare HMO

## 2021-04-20 ENCOUNTER — Other Ambulatory Visit (INDEPENDENT_AMBULATORY_CARE_PROVIDER_SITE_OTHER): Payer: Self-pay | Admitting: Vascular Surgery

## 2021-04-20 DIAGNOSIS — L0390 Cellulitis, unspecified: Secondary | ICD-10-CM | POA: Diagnosis not present

## 2021-04-20 DIAGNOSIS — A419 Sepsis, unspecified organism: Secondary | ICD-10-CM | POA: Diagnosis not present

## 2021-04-20 DIAGNOSIS — L97929 Non-pressure chronic ulcer of unspecified part of left lower leg with unspecified severity: Secondary | ICD-10-CM | POA: Diagnosis not present

## 2021-04-20 LAB — CBC WITH DIFFERENTIAL/PLATELET
Abs Immature Granulocytes: 0.36 10*3/uL — ABNORMAL HIGH (ref 0.00–0.07)
Basophils Absolute: 0.1 10*3/uL (ref 0.0–0.1)
Basophils Relative: 1 %
Eosinophils Absolute: 0.5 10*3/uL (ref 0.0–0.5)
Eosinophils Relative: 4 %
HCT: 34.1 % — ABNORMAL LOW (ref 36.0–46.0)
Hemoglobin: 10.8 g/dL — ABNORMAL LOW (ref 12.0–15.0)
Immature Granulocytes: 3 %
Lymphocytes Relative: 7 %
Lymphs Abs: 0.8 10*3/uL (ref 0.7–4.0)
MCH: 29.8 pg (ref 26.0–34.0)
MCHC: 31.7 g/dL (ref 30.0–36.0)
MCV: 94.2 fL (ref 80.0–100.0)
Monocytes Absolute: 1 10*3/uL (ref 0.1–1.0)
Monocytes Relative: 9 %
Neutro Abs: 7.9 10*3/uL — ABNORMAL HIGH (ref 1.7–7.7)
Neutrophils Relative %: 76 %
Platelets: 347 10*3/uL (ref 150–400)
RBC: 3.62 MIL/uL — ABNORMAL LOW (ref 3.87–5.11)
RDW: 19.4 % — ABNORMAL HIGH (ref 11.5–15.5)
WBC: 10.6 10*3/uL — ABNORMAL HIGH (ref 4.0–10.5)
nRBC: 0 % (ref 0.0–0.2)

## 2021-04-20 LAB — GLUCOSE, CAPILLARY
Glucose-Capillary: 166 mg/dL — ABNORMAL HIGH (ref 70–99)
Glucose-Capillary: 153 mg/dL — ABNORMAL HIGH (ref 70–99)
Glucose-Capillary: 186 mg/dL — ABNORMAL HIGH (ref 70–99)
Glucose-Capillary: 212 mg/dL — ABNORMAL HIGH (ref 70–99)

## 2021-04-20 LAB — BASIC METABOLIC PANEL
Anion gap: 7 (ref 5–15)
BUN: 34 mg/dL — ABNORMAL HIGH (ref 8–23)
CO2: 19 mmol/L — ABNORMAL LOW (ref 22–32)
Calcium: 8.6 mg/dL — ABNORMAL LOW (ref 8.9–10.3)
Chloride: 115 mmol/L — ABNORMAL HIGH (ref 98–111)
Creatinine, Ser: 1.34 mg/dL — ABNORMAL HIGH (ref 0.44–1.00)
GFR, Estimated: 43 mL/min — ABNORMAL LOW (ref >60)
Glucose, Bld: 185 mg/dL — ABNORMAL HIGH (ref 70–99)
Potassium: 4.6 mmol/L (ref 3.5–5.1)
Sodium: 141 mmol/L (ref 135–145)

## 2021-04-20 MED ORDER — CEFAZOLIN SODIUM-DEXTROSE 1-4 GM/50ML-% IV SOLN
1.0000 g | Freq: Three times a day (TID) | INTRAVENOUS | Status: DC
  Administered 2021-04-20 – 2021-04-24 (×13): 1 g via INTRAVENOUS
  Filled 2021-04-20 (×15): qty 50

## 2021-04-20 MED ORDER — SODIUM CHLORIDE 0.9 % IV SOLN: INTRAVENOUS | Status: DC

## 2021-04-20 NOTE — Evaluation (Addendum)
Occupational Therapy Evaluation Patient Details Name: PENNEY DOMANSKI MRN: [id number] DOB: January 11, 1956 Today's Date: 04/20/2021   History of Present Illness 65 y.o. female with a past medical history of DM, HTN, hypothyroidism, kidney stones, lymphedema, morbid obesity and plantar fasciitis followed by podiatry outpatient for some chronic wounds and edema in the lower extremity who presents via EMS after a welfare check was made on the patient with neighbors concerned that patient has been stuck in her recliner for several days.  Patient states her feet have been hurting her for over a month got particularly bad over the last couple of days preventing her from standing or walking.   Clinical Impression   Pt seen for OT evaluation this date. Upon arrival to room, pt seated upright in bed. Pt is a questionable historian, as she had some difficulty recalling home set-up and was unclear with most recent PLOF; per pt, she used RW for household mobility and was independent with ADLs at baseline (spongebathes, however unclear how she was managing LB ADLs). Pt lives alone and reports that her friend assists with IADLs. Pt currently requires MIN A for supine>sit transfer with HOB elevated, SUPERVISION/SET-UP for seated UB ADLs, and MAX A for seated LB dressing. Following x3 attempts and 2-person assist, pt unable to perform sit>stand transfer this date d/t LE weakness. Pt required MAX A+2 to return to supine and MAX A+2 for peri-care as pt was noted to have BM during session. Pt currently presents with decreased strength, activity tolerance, and awareness of deficits. Pt would benefit from additional skilled OT services to maximize return to PLOF and minimize risk of future falls, injury, caregiver burden, and readmission. Upon discharge, recommend SNF.         Recommendations for follow up therapy are one component of a multi-disciplinary discharge planning process, led by the attending physician.   Recommendations may be updated based on patient status, additional functional criteria and insurance authorization.   Follow Up Recommendations  Skilled nursing-short term rehab (<3 hours/day)    Assistance Recommended at Discharge Intermittent Supervision/Assistance  Functional Status Assessment  Patient has had a recent decline in their functional status and/or demonstrates limited ability to make significant improvements in function in a reasonable and predictable amount of time  Equipment Recommendations  Other (comment) (defer to next venue of care)       Precautions / Restrictions Precautions Precautions: Fall Restrictions Weight Bearing Restrictions: No Other Position/Activity Restrictions: Per podiatry (via secure chat), pt likely has "osteomyelitis to her right 5th toe joint that will have to be addressed in the future but for now she should be  able to transfer on her foot and very short distances.  She also has a necrotic heel ulcer on her left foot. Should also minimize ambulation on this side."      Mobility Bed Mobility Overal bed mobility: Needs Assistance Bed Mobility: Supine to Sit;Sit to Supine;Rolling Rolling: +2 for physical assistance;Max assist   Supine to sit: Min assist;HOB elevated Sit to supine: +2 for physical assistance;Max assist   General bed mobility comments: MAX A+2 with bed in trendelenburg to scoot up in bed    Transfers Overall transfer level: Needs assistance Equipment used: Rolling walker (2 wheels) Transfers: Sit to/from Stand Sit to Stand: Max assist;+2 physical assistance           General transfer comment: Pt unable due to fear/weakness. Pt appears self limiting not putting forth full effort to attempt standing due to LE pain and expressing  fear of falling      Balance Overall balance assessment: Needs assistance Sitting-balance support: No upper extremity supported;Feet unsupported Sitting balance-Leahy Scale: Fair Sitting  balance - Comments: able to maintain static sitting balance with no support     Standing balance-Leahy Scale: Zero Standing balance comment: Following x3 sit>stand attempt, pt unable to clear hips from bed following +2 assist                           ADL either performed or assessed with clinical judgement   ADL Overall ADL's : Needs assistance/impaired                                       General ADL Comments: SET-UP/SUPERVISION for seated UB ADLs. MAX A for seated/bed-level LB dressing/pericare      Pertinent Vitals/Pain Pain Assessment: Faces Faces Pain Scale: Hurts even more Pain Location: BLE's Pain Descriptors / Indicators: Aching;Grimacing Pain Intervention(s): Monitored during session;Repositioned        Extremity/Trunk Assessment Upper Extremity Assessment Upper Extremity Assessment: Generalized weakness   Lower Extremity Assessment Lower Extremity Assessment: Generalized weakness RLE Deficits / Details: Cellulitis LLE Deficits / Details: Cellulitis, wounds       Communication Communication Communication: No difficulties   Cognition Arousal/Alertness: Awake/alert Behavior During Therapy: WFL for tasks assessed/performed Overall Cognitive Status: No family/caregiver present to determine baseline cognitive functioning                                 General Comments: Alert and oriented to self, location, and year. Pt is a questionable historian; some difficulty recalling home set-up/PLOF. Pt also presents with decreased awareness of deficits and decreased safety awareness     General Comments  BLE's with cellulitis wrapped in gauze bandages. RLE bleeding through bandage (RN notified). PT/OT found pt's R great toe pressing against metal hook at foot of bed. Repositioned for skin integrity. No additional wounds/redness noted, just skin indentation that improved once removed. RN notified, pillows placed at foot of bed post  session    Exercises Other Exercises Other Exercises: Role of PT in acute setting, bed mobility, d/c recs, skin protection strategies.        Home Living Family/patient expects to be discharged to:: Private residence Living Arrangements: Alone Available Help at Discharge: Friend(s);Available PRN/intermittently Type of Home: House Home Access: Stairs to enter [employer] of Steps: 3-4 Entrance Stairs-Rails:  (Did not disclose if railing available or not) Home Layout: One level     Bathroom Shower/Tub: Other (comment) (pt spongebathes at baseline)         Home Equipment: Rolling Walker (2 wheels);Rollator (4 wheels)   Additional Comments: Pt is a questionable historian, as she had some difficulty recalling home set-up and was unclear with most recent PLOF.      Prior Functioning/Environment Prior Level of Function : Patient poor historian/Family not available             Mobility Comments: Per pt, she walks around home with RW and uses rollator for community mobility. Denies any recent falls. Pt was unclear re when she was last ambulating in community ADLs Comments: Per pt, she is independent with ADLs at baseline, spongebathing. Unable to verbalize how she performs LB dressing/bathing. Pt reports her friend assists with groceries and transportation  for appointments        OT Problem List: Decreased strength;Decreased activity tolerance;Impaired balance (sitting and/or standing);Decreased safety awareness;Obesity;Pain      OT Treatment/Interventions: Self-care/ADL training;Therapeutic exercise;Energy conservation;DME and/or AE instruction;Therapeutic activities;Patient/family education;Balance training    OT Goals(Current goals can be found in the care plan section) Acute Rehab OT Goals Patient Stated Goal: to go back home OT Goal Formulation: With patient Time For Goal Achievement: 05/04/21 Potential to Achieve Goals: Fair ADL Goals Pt Will Perform  Upper Body Dressing: with set-up;sitting Pt Will Perform Lower Body Dressing: with min assist;with adaptive equipment;sitting/lateral leans Pt Will Transfer to Toilet: with mod assist;with +2 assist;stand pivot transfer;bedside commode  OT Frequency: Min 1X/week           Co-evaluation PT/OT/SLP Co-Evaluation/Treatment: Yes Reason for Co-Treatment: Complexity of the patient's impairments (multi-system involvement);For patient/therapist safety;To address functional/ADL transfers PT goals addressed during session: Mobility/safety with mobility OT goals addressed during session: ADL's and self-care      AM-PAC OT "6 Clicks" Daily Activity     Outcome Measure Help from another person eating meals?: None Help from another person taking care of personal grooming?: A Little Help from another person toileting, which includes using toliet, bedpan, or urinal?: Total Help from another person bathing (including washing, rinsing, drying)?: A Lot Help from another person to put on and taking off regular upper body clothing?: A Little Help from another person to put on and taking off regular lower body clothing?: A Lot 6 Click Score: 15   End of Session Equipment Utilized During Treatment: Gait belt;Rolling walker (2 wheels) Nurse Communication: Mobility status  Activity Tolerance: Patient tolerated treatment well Patient left: in bed;with call bell/phone within reach;with bed alarm set;with nursing/sitter in room  OT Visit Diagnosis: Unsteadiness on feet (R26.81);Muscle weakness (generalized) (M62.81);Pain Pain - Right/Left:  (both) Pain - part of body: Leg                Time: 6967-8938 OT Time Calculation (min): 54 min Charges:  OT General Charges $OT Visit: 1 Visit OT Evaluation $OT Eval Moderate Complexity: 1 Mod OT Treatments $Self Care/Home Management : 23-37 mins  Fredirick Maudlin, OTR/L [place]

## 2021-04-20 NOTE — Progress Notes (Signed)
Patient seen today.  Still not able to answer questions appropriately.  Does answer question in regards to hospital and state.  Cannot answer what city she is in.  Does not remember my name.  Does not remember the attempts at the MRI or bone scan which she refused.  Dressing still intact.  Evaluated this right lateral foot and this wound looks to be stable for now.  X-rays are consistent with osteomyelitis.  She refused the MRI secondary to pain.  Bone scan was ordered and only immediate flow images were obtained.  She refuses the delayed imaging.  Vascular has seen and evaluated.  Noninvasive vascular testing showed severe peripheral vascular disease: Dampened monophasic waveforms in the bilateral lower extremities at the level of the ankle, left greater than right. Noncompressible left ankle-brachial indices and likely falsely elevated, normal (0.98), right lower extremity ankle brachial index. Findings overall suggestive of severe bilateral lower extremity peripheral artery disease, likely with significant runoff vessel involvement.  At this point will await final decision from vascular surgery.  Patient may need intervention based on the noninvasive testing.  Patient will likely need debridement with partial ray amputation versus removal of joint from the fifth MTPJ.  Unable to assess if there is osteomyelitis in her left heel.  Debridement of the left heel can be considered down the road as well.  I still have concerns in regards to competency.  We will reach out to internal medicine for their opinion.

## 2021-04-20 NOTE — Evaluation (Signed)
Physical Therapy Evaluation Patient Details Name: [name] MRN: [id number] DOB: 12-May-1956 Today's Date: 04/20/2021  History of Present Illness  65 y.o. female with a past medical history of DM, HTN, hypothyroidism, kidney stones, lymphedema, morbid obesity and plantar fasciitis followed by podiatry outpatient for some chronic wounds and edema in the lower extremity who presents via EMS after a welfare check was made on the patient with neighbors concerned that patient has been stuck in her recliner for several days.  Patient states her feet have been hurting her for over a month got particularly bad over the last couple of days preventing her from standing or walking.   Clinical Impression  Pt admitted with above diagnosis. Pt received supine in bed agreeable to PT/OT co-eval. Per podiatry, pt needs to minimalize ambulation due to wounds on feet, but is safe to mobilize short distances. Pt appears confused, however is alert and oriented to person, place and time (year), and situation. Displays difficulty with word finding, and answering questions appropriately without re-direction/cuing. Initially repotrs level entrance into her home, then states she does have stairs. PLOF per pt is use of RW for household distances and rollator for community tasks but endorses very rarely leaving the house. Pt lives alone and has friend that assists in groceries, and transporting pt in community. Denies any falls in last 6-12 months. Pt able to transfer supine to seated EoB with minA with LE's but requires mod-max VC's for hand placement with HOB elevated and use of bed rails to assist in pt to perform. Pt endorses fear/pain in LE's which I believe limited pt's overall ability to give max effort to display current level of function. X3 attempts made to stand to RW with very little clearance of buttocks off bed with maxA+2. Poor ability to shift weight anteriorly and poor hand placement despite max cueing. BM noted  in standing attempts. MaxA+2 to return pt supine in bed. MaxA+2 to Roll R/L for perihygiene and maxA+2 to maintain position in side lying and dependent for perihygiene. RLE actively bleeding through bandage, so PT assist RN post session to wrap leg. Pt displaying deficits in strength and mobility with inability to stand or ambulate which is well below pt's baseline. Pt will require STR to optimize OOB mobility with LRAD prior to returning to home environment. Pt currently with functional limitations due to the deficits listed below (see PT Problem List). Pt will benefit from skilled PT to increase their independence and safety with mobility to allow discharge to the venue listed below.     Recommendations for follow up therapy are one component of a multi-disciplinary discharge planning process, led by the attending physician.  Recommendations may be updated based on patient status, additional functional criteria and insurance authorization.  Follow Up Recommendations Skilled nursing-short term rehab (<3 hours/day)    Assistance Recommended at Discharge Intermittent Supervision/Assistance  Functional Status Assessment Patient has had a recent decline in their functional status and/or demonstrates limited ability to make significant improvements in function in a reasonable and predictable amount of time  Equipment Recommendations  Other (comment) (tbd by next venue of care)    Recommendations for Other Services       Precautions / Restrictions Precautions Precautions: Fall Restrictions Weight Bearing Restrictions: No      Mobility  Bed Mobility Overal bed mobility: Needs Assistance Bed Mobility: Supine to Sit;Sit to Supine;Rolling Rolling: +2 for physical assistance;Max assist   Supine to sit: Min assist;HOB elevated Sit to supine: +2 for  physical assistance;Max assist   General bed mobility comments: maX +2 with bed in trendelenburg to scoot up in bed Patient Response: Flat  affect  Transfers Overall transfer level: Needs assistance                 General transfer comment: unable due to fear/weakness? Pt appears self limiting not putting forth full effort to attempt standing due to LE pain and expressing fear of falling    Ambulation/Gait             General Gait Details: deferred  Stairs            Wheelchair Mobility    Modified Rankin (Stroke Patients Only)       Balance Overall balance assessment: Needs assistance Sitting-balance support: Bilateral upper extremity supported;Feet unsupported Sitting balance-Leahy Scale: Fair Sitting balance - Comments: able to maintain static sitting balance with no support     Standing balance-Leahy Scale: Zero Standing balance comment: UNable to attain standing                             Pertinent Vitals/Pain Pain Assessment: Faces Faces Pain Scale: Hurts even more Pain Location: BLE's Pain Intervention(s): Monitored during session;Repositioned    Home Living Family/patient expects to be discharged to:: Private residence Living Arrangements: Alone Available Help at Discharge: Friend(s);Available PRN/intermittently Type of Home: House Home Access: Stairs to enter Entrance Stairs-Rails:  (Did not disclose if railing available or not) Entrance Stairs-Number of Steps: 3-4   Home Layout: One level Home Equipment: Conservation officer, nature (2 wheels);Rollator (4 wheels) Additional Comments: Pt reports her friend assist with groceries and transportation for appointments otherwise stays in her home.    Prior Function Prior Level of Function : Patient poor historian/Family not available             Mobility Comments: Per pt, household ambulator with RW, uses rollator for community ADLs Comments: Reports being able to give herself bird baths, uses friend for transportation.     Hand Dominance        Extremity/Trunk Assessment   Upper Extremity Assessment Upper Extremity  Assessment: Generalized weakness    Lower Extremity Assessment Lower Extremity Assessment: Generalized weakness;RLE deficits/detail;LLE deficits/detail RLE Deficits / Details: Cellulitis LLE Deficits / Details: Cellulitis, wounds       Communication   Communication: No difficulties  Cognition Arousal/Alertness: Awake/alert   Overall Cognitive Status: No family/caregiver present to determine baseline cognitive functioning                                 General Comments: A&O to time (year) self, location        General Comments General comments (skin integrity, edema, etc.): BLE's with cellulitis wrapped in gauze bandages. RLE bleeding through bandage (RN notified). PT/OT found pt's R great toe pressing against metal hook at foot of bed. Repositioned for skin integrity. No additional wounds/redness noted, just skin indentation that improved once removed. RN notified, pillows placed at foot of bed post session.    Exercises Other Exercises Other Exercises: Role of PT in acute setting, bed mobility, d/c recs, skin protection strategies.   Assessment/Plan    PT Assessment Patient needs continued PT services  PT Problem List Decreased strength;Pain;Decreased cognition;Decreased activity tolerance;Decreased knowledge of use of DME;Decreased balance;Decreased safety awareness;Decreased mobility;Decreased skin integrity       PT Treatment Interventions DME instruction;Therapeutic  exercise;Gait training;Balance training;Neuromuscular re-education;Stair training;Functional mobility training;Therapeutic activities;Patient/family education    PT Goals (Current goals can be found in the Care Plan section)  Acute Rehab PT Goals Patient Stated Goal: to go home PT Goal Formulation: With patient Time For Goal Achievement: 05/04/21 Potential to Achieve Goals: Poor    Frequency Min 2X/week   Barriers to discharge Decreased caregiver support Stairs to enter home, lives  alone, no family support    Co-evaluation PT/OT/SLP Co-Evaluation/Treatment: Yes Reason for Co-Treatment: Complexity of the patient's impairments (multi-system involvement);Necessary to address cognition/behavior during functional activity;For patient/therapist safety;To address functional/ADL transfers PT goals addressed during session: Mobility/safety with mobility;Proper use of DME;Strengthening/ROM         AM-PAC PT "6 Clicks" Mobility  Outcome Measure Help needed turning from your back to your side while in a flat bed without using bedrails?: A Lot Help needed moving from lying on your back to sitting on the side of a flat bed without using bedrails?: A Lot Help needed moving to and from a bed to a chair (including a wheelchair)?: Total Help needed standing up from a chair using your arms (e.g., wheelchair or bedside chair)?: Total Help needed to walk in hospital room?: Total Help needed climbing 3-5 steps with a railing? : Total 6 Click Score: 8    End of Session Equipment Utilized During Treatment: Gait belt Activity Tolerance: Patient limited by pain;Patient limited by lethargy Patient left: in bed;with call bell/phone within reach;with bed alarm set Nurse Communication: Mobility status PT Visit Diagnosis: Muscle weakness (generalized) (M62.81);Difficulty in walking, not elsewhere classified (R26.2);Pain Pain - Right/Left:  (BLE's) Pain - part of body: Leg    Time: 7048-8891 PT Time Calculation (min) (ACUTE ONLY): 49 min   Charges:   PT Evaluation $PT Eval Moderate Complexity: 1 Mod PT Treatments $Therapeutic Activity: 8-22 mins      Nancylee Gaines M. Fairly IV, PT, DPT Physical Therapist- [facility]  04/20/2021, 11:20 AM

## 2021-04-20 NOTE — Progress Notes (Signed)
Dressing change to bil lower ext declined by pt during this shift.

## 2021-04-20 NOTE — NC FL2 (Signed)
[place] LEVEL OF CARE SCREENING TOOL     IDENTIFICATION  Patient Name: [name] Birthdate: [birth year] Sex: female Admission Date (Current Location): 04/16/2021  [facility] and [place] Number:  Engineering geologist and Address:         Provider Number: [id number]  Attending Physician Name and Address:  Sidney Ace, MD  Relative Name and Phone Number:       Current Level of Care: Hospital Recommended Level of Care: [place] Prior Approval Number:    Date Approved/Denied:   PASRR Number: [id number]  Discharge Plan: SNF    Current Diagnoses: Patient Active Problem List   Diagnosis Date Noted   Sepsis due to cellulitis ([place]) 04/17/2021   Pressure injury of skin 04/17/2021   Morbid obesity ([place]) 10/24/2017   Chronic kidney disease, stage IV (severe) ([place]) 10/24/2017   Hypothyroidism 11/15/2016   Edema 11/15/2016   Type 2 diabetes mellitus with microalbuminuria, without long-term current use of insulin ([place]) 11/15/2016   Essential hypertension 12/09/2015   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 ([place]) 06/26/2015   Pressure ulcer 05/31/2015   Primary osteoarthritis of right hip 05/28/2015    Orientation RESPIRATION BLADDER Height & Weight     Self, Time, Situation, Place  Normal Continent Weight: 118.5 kg Height:  5\' 5"  (165.1 cm)  BEHAVIORAL SYMPTOMS/MOOD NEUROLOGICAL BOWEL NUTRITION STATUS      Continent Diet (Carb modified)  AMBULATORY STATUS COMMUNICATION OF NEEDS Skin   Total Care   PU Stage and Appropriate Care, Skin abrasions, Other (Comment) (Moisture assosicated damage, unstabable, skin cracking)                       Personal Care Assistance Level of Assistance              Functional Limitations Info             SPECIAL CARE FACTORS FREQUENCY  PT (By licensed PT), OT (By licensed OT)                    Contractures Contractures Info: Not present     Additional Factors Info  Code Status, Allergies Code Status Info: Full Allergies Info: NKDA           Current Medications (04/20/2021):  This is the current hospital active medication list Current Facility-Administered Medications  Medication Dose Route Frequency Provider Last Rate Last Admin   0.9 %  sodium chloride infusion   Intravenous PRN Ralene Muskrat B, MD 10 mL/hr at 04/19/21 1522 Infusion Verify at 04/19/21 1522   [START ON 04/21/2021] 0.9 %  sodium chloride infusion   Intravenous Continuous Stegmayer, Kimberly A, PA-C       acetaminophen (TYLENOL) tablet 650 mg  650 mg Oral Q6H PRN Mansy, Jan A, MD       Or   acetaminophen (TYLENOL) suppository 650 mg  650 mg Rectal Q6H PRN Mansy, Jan A, MD       atorvastatin (LIPITOR) tablet 20 mg  20 mg Oral Daily Mansy, Jan A, MD   20 mg at 04/20/21 1327   ceFAZolin (ANCEF) IVPB 1 g/50 mL premix  1 g Intravenous Q8H Sreenath, Sudheer B, MD 100 mL/hr at 04/20/21 1326 1 g at 04/20/21 1326   cloNIDine (CATAPRES) tablet 0.3 mg  0.3 mg Oral TID Mansy, Jan A, MD   0.3 mg at 04/20/21 0933   enoxaparin (LOVENOX) injection 60 mg  0.5 mg/kg Subcutaneous Q24H Narda Rutherford, RPH   60 mg at 04/20/21 1884   gabapentin (NEURONTIN) capsule 300 mg  300 mg Oral BID Ralene Muskrat B, MD   300 mg at 04/19/21 2203   hydrALAZINE (APRESOLINE) injection 10 mg  10 mg Intravenous Q4H PRN Sidney Ace, MD       hydrocerin (EUCERIN) cream   Topical Daily Sidney Ace, MD   Given at 04/20/21 0935   HYDROmorphone (DILAUDID) injection 0.5 mg  0.5 mg Intravenous Q3H PRN Ralene Muskrat B, MD   0.5 mg at 04/19/21 1122   insulin aspart (novoLOG) injection 0-15 Units  0-15 Units Subcutaneous TID WC Ralene Muskrat B, MD   3 Units at 04/20/21 1330   insulin aspart (novoLOG) injection 0-5 Units  0-5 Units Subcutaneous QHS Ralene Muskrat B, MD       levothyroxine (SYNTHROID) tablet 112 mcg  112 mcg Oral Q0600 Mansy, Jan A, MD   112 mcg at  04/20/21 0540   liver oil-zinc oxide (DESITIN) 40 % ointment   Topical PRN Mansy, Jan A, MD       magnesium hydroxide (MILK OF MAGNESIA) suspension 30 mL  30 mL Oral Daily PRN Mansy, Jan A, MD       metoprolol succinate (TOPROL-XL) 24 hr tablet 50 mg  50 mg Oral Q breakfast Mansy, Jan A, MD   50 mg at 04/20/21 0933   ondansetron (ZOFRAN) tablet 4 mg  4 mg Oral Q6H PRN Mansy, Jan A, MD       Or   ondansetron [facility]) injection 4 mg  4 mg Intravenous Q6H PRN Mansy, Jan A, MD       oxyCODONE (Oxy IR/ROXICODONE) immediate release tablet 5 mg  5 mg Oral Q4H PRN Priscella Mann, Sudheer B, MD   5 mg at 04/19/21 0131   traZODone (DESYREL) tablet 25 mg  25 mg Oral QHS PRN Mansy, Jan A, MD   25 mg at 04/18/21 2121     Discharge Medications: Please see discharge summary for a list of discharge medications.  Relevant Imaging Results:  Relevant Lab Results:   Additional Information ss [id number]  Beverly Sessions, RN

## 2021-04-20 NOTE — Progress Notes (Signed)
PROGRESS NOTE    [name]  ZHY:[id number] DOB: [birth year] DOA: 04/16/2021 PCP: Juline Patch, MD    Brief Narrative:  unfortunate 65 year old female with extensive past medical history who was brought in via EMS for evaluation of bilateral lower extremity redness swelling and pain.  Apparently patient has been in her wheelchair at home unable to move for several days.  Lower extremity significant cellulitic changes.  On broad-spectrum antibiotics.  Also some question of fracture in the feet on the right lower extremity.    Podiatry consulted on admission, recommendations appreciated.  Concern regarding vascular supply lower extremities.  Podiatry apparently reached out to vascular surgery for consultation prior to any surgical intervention.  Awaiting vascular evaluation to assess lower extremity vasculopathy.   Patient remains hemodynamically stable.  Remains on broad-spectrum antibiotics, IV fluids, as needed pain control.  Appreciate WOC consult.  Appreciate vascular evaluation and recommendations.  ABI completed with results significant for severe bilateral peripheral arterial disease.  Pending podiatry follow-up.   Assessment & Plan:   Active Problems:   Sepsis due to cellulitis (HCC)   Pressure injury of skin  Severe bilateral lower extremity cellulitis, nonpurulent Sepsis secondary to above Sepsis criteria met with tachycardia and leukocytosis.  Presumed source bilateral lower extremity nonpurulent cellulitis Concern regarding vascular supply Podiatry and vascular consulted MRSA screen negative Plan: Discontinue vancomycin Discontinue cefepime Start Ancef for nonpurulent cellulitis no further IV fluids DC IV fluids As needed pain management Podiatry and vascular follow-up  pending decision regarding surgical intervention from podiatry standpoint  Acute kidney injury on chronic kidney disease stage IIIb Unclear etiology ATN versus prerenal azotemia Hold home  telmisartan Daily renal function  Hyperkalemia Improved  Hypothyroidism PTA Synthroid  Type 2 diabetes mellitus with peripheral neuropathy Oral medications on hold Sliding scale coverage Carb modified diet Continue Neurontin  Essential hypertension PTA clonidine and Toprol-XL  Home ARB on hold IV hydralazine as needed  Morbid obesity BMI 29.52 This complicates overall care and prognosis Nutrition consult  Poor self-care TOC consult DSS evaluation pending   DVT prophylaxis: SQ Lovenox Code Status: Full Family Communication: None today Disposition Plan: Status is: Inpatient  Remains inpatient appropriate because: Sepsis secondary to bilateral lower extremity cellulitis.  Pending podiatry follow-up.  1 surgical plan is in place we will able to formulate disposition plan.  TOC aware.  Home may not be a safe disposition.  Pending therapy evaluations.       Level of care: Med-Surg  Consultants:  Podiatry Vascular surgery  Procedures:  None  Antimicrobials: Cefazolin, started 10/25   Subjective: Seen and examined.  Remains sleepy.  When awakened she is oriented and states he feels a little better.  Objective: Vitals:   04/19/21 0818 04/19/21 1610 04/20/21 0422 04/20/21 0755  BP: (!) 156/61 (!) 153/61 (!) 168/61 (!) 175/69  Pulse: 64 70 69 64  Resp: $Remo'16 16 18   'JJQra$ Temp: 97.8 F (36.6 C) (!) 97.5 F (36.4 C) 98.8 F (37.1 C) 98.2 F (36.8 C)  TempSrc: Oral Oral Oral Oral  SpO2: 100% 99% 98% 98%  Weight:      Height:        Intake/Output Summary (Last 24 hours) at 04/20/2021 1012 Last data filed at 04/20/2021 0500 Gross per 24 hour  Intake 737.26 ml  Output 700 ml  Net 37.26 ml   Filed Weights   04/16/21 2244 04/17/21 0403  Weight: 122.5 kg 118.5 kg    Examination:  General exam: No acute distress.  Appears lethargic.  Appears chronically ill Respiratory system: Poor respiratory effort.  Bibasilar crackles.  Normal work of breathing.  Room  air Cardiovascular system: S1-S2, RRR, no murmurs, bilateral nonpitting edema Gastrointestinal system: Obese, NT/ND, normal bowel sounds Central nervous system: Sleepy, oriented x2, no focal deficits Extremities: Diffusely decreased power symmetrically, nonpalpable pedal pulses Skin: Bilateral lower extremity lichenification.  Nonpurulent cellulitis.  Erythematous.  Painful to touch Psychiatry: Judgement and insight appear impaired. Mood & affect flattened.     Data Reviewed: I have personally reviewed following labs and imaging studies  CBC: Recent Labs  Lab 04/17/21 0019 04/17/21 0436 04/18/21 0525 04/19/21 0618 04/20/21 0605  WBC 18.9* 18.5* 10.5 10.3 10.6*  NEUTROABS 16.9*  --  8.6* 7.9* 7.9*  HGB 12.6 11.2* 9.2* 9.5* 10.8*  HCT 38.9 34.5* 28.9* 30.6* 34.1*  MCV 95.8 96.4 94.1 95.6 94.2  PLT 429* 372 270 287 185   Basic Metabolic Panel: Recent Labs  Lab 04/17/21 0019 04/17/21 0436 04/18/21 0525 04/19/21 0618 04/20/21 0605  NA 141 142 140 141 141  K 5.6* 4.6 4.1 4.4 4.6  CL 114* 118* 118* 115* 115*  CO2 14* 17* 17* 19* 19*  GLUCOSE 120* 118* 192* 206* 185*  BUN 60* 58* 47* 41* 34*  CREATININE 2.13* 1.85* 1.37* 1.57* 1.34*  CALCIUM 9.0 8.7* 8.0* 8.3* 8.6*  MG 2.4  --   --   --   --    GFR: Estimated Creatinine Clearance: 52.5 mL/min (A) (by C-G formula based on SCr of 1.34 mg/dL (H)). Liver Function Tests: Recent Labs  Lab 04/17/21 0019  AST 25  ALT 15  ALKPHOS 74  BILITOT 0.9  PROT 8.6*  ALBUMIN 3.2*   No results for input(s): LIPASE, AMYLASE in the last 168 hours. No results for input(s): AMMONIA in the last 168 hours. Coagulation Profile: Recent Labs  Lab 04/17/21 0019 04/17/21 0436  INR 1.3* 1.4*   Cardiac Enzymes: Recent Labs  Lab 04/17/21 0019  CKTOTAL 156   BNP (last 3 results) No results for input(s): PROBNP in the last 8760 hours. HbA1C: Recent Labs    04/19/21 0618  HGBA1C 5.3   CBG: Recent Labs  Lab 04/17/21 1617  04/19/21 1633 04/19/21 2128 04/20/21 0752  GLUCAP 156* 200* 173* 166*   Lipid Profile: No results for input(s): CHOL, HDL, LDLCALC, TRIG, CHOLHDL, LDLDIRECT in the last 72 hours. Thyroid Function Tests: No results for input(s): TSH, T4TOTAL, FREET4, T3FREE, THYROIDAB in the last 72 hours. Anemia Panel: No results for input(s): VITAMINB12, FOLATE, FERRITIN, TIBC, IRON, RETICCTPCT in the last 72 hours. Sepsis Labs: Recent Labs  Lab 04/17/21 0019 04/17/21 0436  PROCALCITON  --  0.73  LATICACIDVEN 2.0*  --     Recent Results (from the past 240 hour(s))  Blood culture (routine x 2)     Status: None (Preliminary result)   Collection Time: 04/17/21 12:19 AM   Specimen: BLOOD  Result Value Ref Range Status   Specimen Description BLOOD RHAND  Final   Special Requests   Final    BOTTLES DRAWN AEROBIC AND ANAEROBIC Blood Culture adequate volume   Culture   Final    NO GROWTH 3 DAYS Performed at [facility], [address] [place], [place] [address]    Report Status PENDING  Incomplete  Resp Panel by RT-PCR (Flu A&B, Covid) Nasopharyngeal Swab     Status: None   Collection Time: 04/17/21  1:34 AM   Specimen: Nasopharyngeal Swab; Nasopharyngeal(NP) swabs in vial transport  medium  Result Value Ref Range Status   SARS Coronavirus 2 by RT PCR NEGATIVE NEGATIVE Final    Comment: (NOTE) SARS-CoV-2 target nucleic acids are NOT DETECTED.  The SARS-CoV-2 RNA is generally detectable in upper respiratory specimens during the acute phase of infection. The lowest concentration of SARS-CoV-2 viral copies this assay can detect is 138 copies/mL. A negative result does not preclude SARS-Cov-2 infection and should not be used as the sole basis for treatment or other patient management decisions. A negative result may occur with  improper specimen collection/handling, submission of specimen other than nasopharyngeal swab, presence of viral mutation(s) within the areas targeted by this  assay, and inadequate number of viral copies(<138 copies/mL). A negative result must be combined with clinical observations, patient history, and epidemiological information. The expected result is Negative.  Fact Sheet for Patients:  EntrepreneurPulse.com.au  Fact Sheet for Healthcare Providers:  IncredibleEmployment.be  This test is no t yet approved or cleared by the [place] FDA and  has been authorized for detection and/or diagnosis of SARS-CoV-2 by FDA under an Emergency Use Authorization (EUA). This EUA will remain  in effect (meaning this test can be used) for the duration of the COVID-19 declaration under Section 564(b)(1) of the Act, 21 U.S.C.section 360bbb-3(b)(1), unless the authorization is terminated  or revoked sooner.       Influenza A by PCR NEGATIVE NEGATIVE Final   Influenza B by PCR NEGATIVE NEGATIVE Final    Comment: (NOTE) The Xpert Xpress SARS-CoV-2/FLU/RSV plus assay is intended as an aid in the diagnosis of influenza from Nasopharyngeal swab specimens and should not be used as a sole basis for treatment. Nasal washings and aspirates are unacceptable for Xpert Xpress SARS-CoV-2/FLU/RSV testing.  Fact Sheet for Patients: EntrepreneurPulse.com.au  Fact Sheet for Healthcare Providers: IncredibleEmployment.be  This test is not yet approved or cleared by the [place] FDA and has been authorized for detection and/or diagnosis of SARS-CoV-2 by FDA under an Emergency Use Authorization (EUA). This EUA will remain in effect (meaning this test can be used) for the duration of the COVID-19 declaration under Section 564(b)(1) of the Act, 21 U.S.C. section 360bbb-3(b)(1), unless the authorization is terminated or revoked.  Performed at [facility], [address] [place], [place] [address]   Blood culture (routine x 2)     Status: None (Preliminary result)   Collection  Time: 04/17/21  2:25 AM   Specimen: BLOOD  Result Value Ref Range Status   Specimen Description BLOOD BLOOD LEFT HAND  Final   Special Requests   Final    BOTTLES DRAWN AEROBIC AND ANAEROBIC Blood Culture adequate volume   Culture   Final    NO GROWTH 3 DAYS Performed at [facility], [address], [place], [place] [address]    Report Status PENDING  Incomplete  Surgical pcr screen     Status: None   Collection Time: 04/19/21  1:09 PM   Specimen: Nasal Mucosa; Nasal Swab  Result Value Ref Range Status   MRSA, PCR NEGATIVE NEGATIVE Final   Staphylococcus aureus NEGATIVE NEGATIVE Final    Comment: (NOTE) The Xpert SA Assay (FDA approved for NASAL specimens in patients 54 years of age and older), is one component of a comprehensive surveillance program. It is not intended to diagnose infection nor to guide or monitor treatment. Performed at [facility], [address], [place], [place] [address]          Radiology Studies: US ARTERIAL ABI (  SCREENING LOWER EXTREMITY)  Result Date: 04/20/2021 CLINICAL DATA:  65 year old female with bilateral foot wounds. EXAM: NONINVASIVE PHYSIOLOGIC VASCULAR STUDY OF BILATERAL LOWER EXTREMITIES TECHNIQUE: Evaluation of both lower extremities were performed at rest, including calculation of ankle-brachial indices with single level Doppler, pressure and pulse volume recording. COMPARISON:  None. FINDINGS: Right ABI:  0.98 Left ABI:  Noncompressible. Right Lower Extremity: Monophasic waveforms at the level of the ankle, dampened in the posterior tibial artery. Left Lower Extremity: Dampened monophasic waveforms at the level of the ankle. IMPRESSION: Dampened monophasic waveforms in the bilateral lower extremities at the level of the ankle, left greater than right. Noncompressible left ankle-brachial indices and likely falsely elevated, normal (0.98), right lower extremity ankle brachial index. Findings overall suggestive of severe  bilateral lower extremity peripheral artery disease, likely with significant runoff vessel involvement. Electronically Signed   By: Ruthann Cancer M.D.   On: 04/20/2021 09:25   NM Bone Scan 3 Phase Lower Extremity  Result Date: 04/19/2021 CLINICAL DATA:  Concern for osteomyelitis ulceration of the LEFT heel. Ulcer of the RIGHT great toe EXAM: NUCLEAR MEDICINE 3-PHASE BONE SCAN TECHNIQUE: Radionuclide angiographic images, immediate static blood pool images were obtained of the 21.9 after intravenous injection of radiopharmaceutical. Delayed imaging was not performed due to patient discomfort. Patient has significant pain during imaging and refused to continue. RADIOPHARMACEUTICALS:  21.9 mCi Tc-73m MDP IV COMPARISON:  Plain films 04/16/2021 FINDINGS: Vascular phase: There is hyperemia to the LEFT heel and RIGHT forefoot Blood pool phase: There is increased blood pool activity in the LEFT heel as well as the RIGHT forefoot and great toe. Delayed phase: Omitted as above. Patient could not complete exam due to discomfort. IMPRESSION: 1. Hyperemia and blood pool activity in the LEFT heel differential including osteomyelitis versus cellulitis. 2. Hyperemia and blood pool activity in the RIGHT forefoot and great toe with differential including osteomyelitis versus cellulitis. 3. Patient could not complete delayed imaging as above. Electronically Signed   By: Suzy Bouchard M.D.   On: 04/19/2021 21:05        Scheduled Meds:  atorvastatin  20 mg Oral Daily   cloNIDine  0.3 mg Oral TID   enoxaparin (LOVENOX) injection  0.5 mg/kg Subcutaneous Q24H   gabapentin  300 mg Oral BID   hydrocerin   Topical Daily   insulin aspart  0-15 Units Subcutaneous TID WC   insulin aspart  0-5 Units Subcutaneous QHS   levothyroxine  112 mcg Oral Q0600   metoprolol succinate  50 mg Oral Q breakfast   Continuous Infusions:  sodium chloride 10 mL/hr at 04/19/21 1522   ceFEPime (MAXIPIME) IV 2 g (04/19/21 2208)    metronidazole 500 mg (04/20/21 0547)     LOS: 3 days    Time spent: 25 minutes    Sidney Ace, MD [facility]   If 7PM-7AM, please contact night-coverage  04/20/2021, 10:12 AM

## 2021-04-20 NOTE — TOC Progression Note (Signed)
Transition of Care [facility]) - Progression Note    Patient Details  Name: LEANDA PADMORE MRN: [id number] Date of Birth: 1956-03-20  [facility]) CM/SW Contact  Beverly Sessions, RN Phone Number: 04/20/2021, 2:25 PM  Clinical Narrative:     Patient not medically ready for discharge PT OT recommending SNF Patient agreeable to bed search  Existing PASRR Fl2 sent for signature Bed search initiated   Expected Discharge Plan: [place] Barriers to Discharge: Continued Medical Work up  Expected Discharge Plan and Services Expected Discharge Plan: [place] arrangements for the past 2 months: Single Family Home                                       Social Determinants of Health (SDOH) Interventions    Readmission Risk Interventions No flowsheet data found.

## 2021-04-20 NOTE — Progress Notes (Signed)
[facility] Daily Progress Note  Subjective: Patient without complaint this AM.  No acute issues overnight.  Objective: Vitals:   04/19/21 0818 04/19/21 1610 04/20/21 0422 04/20/21 0755  BP: (!) 156/61 (!) 153/61 (!) 168/61 (!) 175/69  Pulse: 64 70 69 64  Resp: 16 16 18    Temp: 97.8 F (36.6 C) (!) 97.5 F (36.4 C) 98.8 F (37.1 C) 98.2 F (36.8 C)  TempSrc: Oral Oral Oral Oral  SpO2: 100% 99% 98% 98%  Weight:      Height:        Intake/Output Summary (Last 24 hours) at 04/20/2021 1231 Last data filed at 04/20/2021 0500 Gross per 24 hour  Intake 617.26 ml  Output 700 ml  Net -82.74 ml   Physical Exam: A&Ox3, NAD CV: RRR Pulmonary: CTA Bilaterally Abdomen: Soft, Nontender, Nondistended Vascular:             Right lower extremity: Thigh soft. Calf soft. Moderate edema.  Wounds currently are dressed with dressings that are clean and dry.             Left lower extremity: Thigh soft. Calf soft. Moderate edema.  Wounds currently are dressed with dressings that are clean and dry.   Laboratory: CBC    Component Value Date/Time   WBC 10.6 (H) 04/20/2021 0605   HGB 10.8 (L) 04/20/2021 0605   HGB 12.5 02/20/2012 0737   HCT 34.1 (L) 04/20/2021 0605   HCT 37.6 02/20/2012 0737   PLT 347 04/20/2021 0605   PLT 230 02/20/2012 0737   BMET    Component Value Date/Time   NA 141 04/20/2021 0605   NA 137 10/22/2020 1450   NA 139 02/20/2012 0737   K 4.6 04/20/2021 0605   K 4.7 02/20/2012 0737   CL 115 (H) 04/20/2021 0605   CL 103 02/20/2012 0737   CO2 19 (L) 04/20/2021 0605   CO2 27 02/20/2012 0737   GLUCOSE 185 (H) 04/20/2021 0605   GLUCOSE 123 (H) 02/20/2012 0737   BUN 34 (H) 04/20/2021 0605   BUN 27 10/22/2020 1450   BUN 26 (H) 02/20/2012 0737   CREATININE 1.34 (H) 04/20/2021 0605   CREATININE 1.31 (H) 02/20/2012 0737   CALCIUM 8.6 (L) 04/20/2021 0605   CALCIUM 9.3 02/20/2012 0737   GFRNONAA 43 (L) 04/20/2021 0605   GFRNONAA 45 (L) 02/20/2012  0737   GFRAA 39 (L) 04/30/2020 1408   GFRAA 52 (L) 02/20/2012 0737   Assessment/Planning: The patient is a 65 year old female who presents with severe lymphedema versus venous insufficiency to the bilateral legs   1) Bilateral lower extremity atherosclerotic disease with ulceration: Patient with multiple risk factors for atherosclerotic disease Nonhealing ulceration to the bilateral legs ABI notable for:  Dampened monophasic waveforms in the bilateral lower extremities at the level of the ankle, left greater than right. Noncompressible left ankle-brachial indices and likely falsely elevated, normal (0.98), right lower extremity ankle brachial index. Findings overall suggestive of severe bilateral lower extremity peripheral artery disease, likely with significant runoff vessel involvement.  Patient will need angiograms to the bilateral lower extremities.  We will start with the right lower extremity in an attempt to assess her anatomy and contributing degree of atherosclerotic disease.  If appropriate an attempt to revascularize leg can be made at that time.  Procedure, risks and benefits were explained to the patient.  All questions were answered.  Patient wishes to proceed.  We will plan on this possibly tomorrow with either Dr.  Dew / Civil engineer, contracting.   2) Lymphedema versus venous insufficiency: Contributing component Patient does not engage in conservative therapy including wearing medical grade 1 compression socks, elevation of her extremities remaining active Patient will need a formal work-up possibly laser ablation lymphedema pump in the future Once angiograms are completed and arterial patency improved would recommend starting with minor wrap therapy to gain control of the situation  Patient is in agreement with plan   3) Type 2 diabetes: On appropriate medication. Encouraged good control as its slows the progression of atherosclerotic disease.   Discussed with Dr. Eber Hong  Tanara Turvey PA-C 04/20/2021 12:31 PM

## 2021-04-20 NOTE — TOC Progression Note (Signed)
Transition of Care [facility]) - Progression Note    Patient Details  Name: [name] MRN: [id number] Date of Birth: [birth year]  [facility]) CM/SW Contact  Beverly Sessions, RN Phone Number: 04/20/2021, 9:19 AM  Clinical Narrative:     PT/OT pending Received Call from Milus Glazier at [place], she will be coming today to evaluate the patient  Expected Discharge Plan: [place] Barriers to Discharge: Continued Medical Work up  Expected Discharge Plan and Services Expected Discharge Plan: [place] arrangements for the past 2 months: Single Family Home                                       Social Determinants of Health (SDOH) Interventions    Readmission Risk Interventions No flowsheet data found.

## 2021-04-21 ENCOUNTER — Encounter
Admission: EM | Disposition: A | Discharge status: Skilled Nursing Facility | Payer: Medicare HMO | Source: Home / Self Care | Attending: Internal Medicine

## 2021-04-21 DIAGNOSIS — F4322 Adjustment disorder with anxiety: Secondary | ICD-10-CM | POA: Diagnosis not present

## 2021-04-21 DIAGNOSIS — N179 Acute kidney failure, unspecified: Secondary | ICD-10-CM | POA: Diagnosis not present

## 2021-04-21 DIAGNOSIS — L0390 Cellulitis, unspecified: Secondary | ICD-10-CM | POA: Diagnosis not present

## 2021-04-21 DIAGNOSIS — I10 Essential (primary) hypertension: Secondary | ICD-10-CM

## 2021-04-21 DIAGNOSIS — N1832 Chronic kidney disease, stage 3b: Secondary | ICD-10-CM | POA: Diagnosis not present

## 2021-04-21 LAB — BASIC METABOLIC PANEL
Anion gap: 5 (ref 5–15)
BUN: 30 mg/dL — ABNORMAL HIGH (ref 8–23)
CO2: 18 mmol/L — ABNORMAL LOW (ref 22–32)
Calcium: 7.9 mg/dL — ABNORMAL LOW (ref 8.9–10.3)
Chloride: 113 mmol/L — ABNORMAL HIGH (ref 98–111)
Creatinine, Ser: 1.51 mg/dL — ABNORMAL HIGH (ref 0.44–1.00)
GFR, Estimated: 38 mL/min — ABNORMAL LOW (ref >60)
Glucose, Bld: 267 mg/dL — ABNORMAL HIGH (ref 70–99)
Potassium: 4.4 mmol/L (ref 3.5–5.1)
Sodium: 136 mmol/L (ref 135–145)

## 2021-04-21 LAB — CBC WITH DIFFERENTIAL/PLATELET
Abs Immature Granulocytes: 0.3 10*3/uL — ABNORMAL HIGH (ref 0.00–0.07)
Basophils Absolute: 0.1 10*3/uL (ref 0.0–0.1)
Basophils Relative: 1 %
Eosinophils Absolute: 0.4 10*3/uL (ref 0.0–0.5)
Eosinophils Relative: 4 %
HCT: 29.4 % — ABNORMAL LOW (ref 36.0–46.0)
Hemoglobin: 9.8 g/dL — ABNORMAL LOW (ref 12.0–15.0)
Immature Granulocytes: 3 %
Lymphocytes Relative: 8 %
Lymphs Abs: 0.9 10*3/uL (ref 0.7–4.0)
MCH: 31.5 pg (ref 26.0–34.0)
MCHC: 33.3 g/dL (ref 30.0–36.0)
MCV: 94.5 fL (ref 80.0–100.0)
Monocytes Absolute: 1 10*3/uL (ref 0.1–1.0)
Monocytes Relative: 9 %
Neutro Abs: 8.1 10*3/uL — ABNORMAL HIGH (ref 1.7–7.7)
Neutrophils Relative %: 75 %
Platelets: 310 10*3/uL (ref 150–400)
RBC: 3.11 MIL/uL — ABNORMAL LOW (ref 3.87–5.11)
RDW: 19.1 % — ABNORMAL HIGH (ref 11.5–15.5)
WBC: 10.8 10*3/uL — ABNORMAL HIGH (ref 4.0–10.5)
nRBC: 0 % (ref 0.0–0.2)

## 2021-04-21 LAB — GLUCOSE, CAPILLARY
Glucose-Capillary: 218 mg/dL — ABNORMAL HIGH (ref 70–99)
Glucose-Capillary: 173 mg/dL — ABNORMAL HIGH (ref 70–99)
Glucose-Capillary: 246 mg/dL — ABNORMAL HIGH (ref 70–99)
Glucose-Capillary: 275 mg/dL — ABNORMAL HIGH (ref 70–99)

## 2021-04-21 SURGERY — LOWER EXTREMITY ANGIOGRAPHY
Anesthesia: Moderate Sedation | Laterality: Right

## 2021-04-21 MED ORDER — AMLODIPINE BESYLATE 5 MG PO TABS
5.0000 mg | ORAL_TABLET | Freq: Every day | ORAL | Status: DC
  Administered 2021-04-21 – 2021-05-11 (×19): 5 mg via ORAL
  Filled 2021-04-21 (×19): qty 1

## 2021-04-21 NOTE — TOC Progression Note (Addendum)
Transition of Care [facility]) - Progression Note    Patient Details  Name: [name] MRN: [id number] Date of Birth: 08-09-1955  Transition of Care [facility]) CM/SW [place], LCSW Phone Number: 04/21/2021, 10:32 AM  Clinical Narrative:  Patient has one bed offers so far: [facility]. [facility] and [facility] are still pending. All others in the area have declined. Went by room to discuss with patient but she is too lethargic for conversation. Will try again later.   4:21 pm: Met with patient and provided update on bed offers. Patient doesn't think she wants SNF anymore but CSW encouraged her to at least consider. Discussed concerns with weakness and state of her home. Preference would be [facility] because it down the street from her house. Left message for admissions coordinator asking her to review. Patient asking about alternative to Gabapentin because it makes her lethargic and "loopy." She stated she has called her PCP office several times with no call back. Sent secure chat to MD and pharmacist to notify.  Expected Discharge Plan: [place] Barriers to Discharge: Continued Medical Work up  Expected Discharge Plan and Services Expected Discharge Plan: [place] arrangements for the past 2 months: Single Family Home                                       Social Determinants of Health (SDOH) Interventions    Readmission Risk Interventions No flowsheet data found.

## 2021-04-21 NOTE — Progress Notes (Signed)
PROGRESS NOTE    [name]  DBZ:[id number] DOB: 1956-03-15 DOA: 04/16/2021 PCP: Juline Patch, MD    Brief Narrative:  unfortunate 65 year old female with extensive past medical history who was brought in via EMS for evaluation of bilateral lower extremity redness swelling and pain.  Apparently patient has been in her wheelchair at home unable to move for several days.  Lower extremity significant cellulitic changes.  On broad-spectrum antibiotics.  Also some question of fracture in the feet on the right lower extremity.    Podiatry consulted on admission, recommendations appreciated.  Concern regarding vascular supply lower extremities.  Podiatry apparently reached out to vascular surgery for consultation prior to any surgical intervention.  Awaiting vascular evaluation to assess lower extremity vasculopathy.   Patient remains hemodynamically stable.  Remains on broad-spectrum antibiotics, IV fluids, as needed pain control.  Appreciate WOC consult.  Appreciate vascular evaluation and recommendations.  ABI completed with results significant for severe bilateral peripheral arterial disease.  Pending podiatry follow-up.  10/26 angiogram cancelled. Concern if pt is competent to make decisions.psych consulted   Assessment & Plan:   Active Problems:   Sepsis due to cellulitis (HCC)   Pressure injury of skin  Severe bilateral lower extremity cellulitis, nonpurulent Sepsis secondary to above Sepsis criteria met with tachycardia and leukocytosis.  Presumed source bilateral lower extremity nonpurulent cellulitis Concern regarding vascular supply Podiatry and vascular consulted MRSA screen negative 7/26 vancomycin and cefepime were discontinued.  Patient was started on Ancef for nonpurulent cellulitis Podiatry and vascular following Will need evaluation with angiogram but will need to have psychiatry to see if patient is competent to make decisions on her procedures before  proceeding   Acute kidney injury on chronic kidney disease stage IIIb ATN versus prerenal azotemia 10/26 continue to hold telmisartan Monitor renal function   Hyperkalemia Improved.  Hypothyroidism Continue Synthroid continue R-ISS  Type 2 diabetes mellitus with peripheral neuropathy Oral medications on hold Sliding scale coverage Carb modified diet Continue Neurontin  Essential hypertension PTA clonidine and Toprol-XL  Home ARB on hold IV hydralazine as needed 10/26 mildly elevated.  Will add amlodipine 5 mg daily  Morbid obesity BMI 12.24 This complicates overall care and prognosis Nutrition consult  Poor self-care TOC consult DSS evaluation pending   DVT prophylaxis: SQ Lovenox Code Status: Full Family Communication: None at bedside Disposition Plan: Status is: Inpatient  Remains inpatient appropriate because: Sepsis secondary to bilateral lower extremity cellulitis.  Pending podiatry follow-up.  Need competency evaluation by psychiatry.  Will need angiogram at some point.  Work-up and treatment pending         Level of care: Med-Surg  Consultants:  Podiatry Vascular surgery  Procedures:  None  Antimicrobials: Cefazolin, started 10/25   Subjective: Has no sob, cp, abd pain  Objective: Vitals:   04/20/21 0755 04/20/21 1522 04/20/21 2010 04/21/21 0421  BP: (!) 175/69 (!) 131/45 (!) 141/51 (!) 166/62  Pulse: 64 71 (!) 55 (!) 53  Resp:  _0 Temp: 98.2 F (36.8 C) 98.6 F (37 C) 98.3 F (36.8 C) 98.1 F (36.7 C)  TempSrc: Oral Oral Oral Oral  SpO2: 98% 100% 96% 97%  Weight:      Height:        Intake/Output Summary (Last 24 hours) at 04/21/2021 0808 Last data filed at 04/21/2021 0501 Gross per 24 hour  Intake 956.44 ml  Output --  Net 956.44 ml   Filed Weights   04/16/21 2244 04/17/21 0403  Weight: 122.5 kg 118.5 kg    Examination:  Nad, calm Cta no w/r Regular s1/s2 no gallop Soft benign +bs +LE edema, b/l  dressing in place Awake and alert Mood and affect appropriate in current setting    Data Reviewed: I have personally reviewed following labs and imaging studies  CBC: Recent Labs  Lab 04/17/21 0019 04/17/21 0436 04/18/21 0525 04/19/21 0618 04/20/21 0605 04/21/21 0536  WBC 18.9* 18.5* 10.5 10.3 10.6* 10.8*  NEUTROABS 16.9*  --  8.6* 7.9* 7.9* 8.1*  HGB 12.6 11.2* 9.2* 9.5* 10.8* 9.8*  HCT 38.9 34.5* 28.9* 30.6* 34.1* 29.4*  MCV 95.8 96.4 94.1 95.6 94.2 94.5  PLT 429* 372 270 287 347 161   Basic Metabolic Panel: Recent Labs  Lab 04/17/21 0019 04/17/21 0436 04/18/21 0525 04/19/21 0618 04/20/21 0605 04/21/21 0536  NA 141 142 140 141 141 136  K 5.6* 4.6 4.1 4.4 4.6 4.4  CL 114* 118* 118* 115* 115* 113*  CO2 14* 17* 17* 19* 19* 18*  GLUCOSE 120* 118* 192* 206* 185* 267*  BUN 60* 58* 47* 41* 34* 30*  CREATININE 2.13* 1.85* 1.37* 1.57* 1.34* 1.51*  CALCIUM 9.0 8.7* 8.0* 8.3* 8.6* 7.9*  MG 2.4  --   --   --   --   --    GFR: Estimated Creatinine Clearance: 46.6 mL/min (A) (by C-G formula based on SCr of 1.51 mg/dL (H)). Liver Function Tests: Recent Labs  Lab 04/17/21 0019  AST 25  ALT 15  ALKPHOS 74  BILITOT 0.9  PROT 8.6*  ALBUMIN 3.2*   No results for input(s): LIPASE, AMYLASE in the last 168 hours. No results for input(s): AMMONIA in the last 168 hours. Coagulation Profile: Recent Labs  Lab 04/17/21 0019 04/17/21 0436  INR 1.3* 1.4*   Cardiac Enzymes: Recent Labs  Lab 04/17/21 0019  CKTOTAL 156   BNP (last 3 results) No results for input(s): PROBNP in the last 8760 hours. HbA1C: Recent Labs    04/19/21 0618  HGBA1C 5.3   CBG: Recent Labs  Lab 04/20/21 0752 04/20/21 1138 04/20/21 1625 04/20/21 2127 04/21/21 0747  GLUCAP 166* 153* 186* 212* 218*   Lipid Profile: No results for input(s): CHOL, HDL, LDLCALC, TRIG, CHOLHDL, LDLDIRECT in the last 72 hours. Thyroid Function Tests: No results for input(s): TSH, T4TOTAL, FREET4, T3FREE,  THYROIDAB in the last 72 hours. Anemia Panel: No results for input(s): VITAMINB12, FOLATE, FERRITIN, TIBC, IRON, RETICCTPCT in the last 72 hours. Sepsis Labs: Recent Labs  Lab 04/17/21 0019 04/17/21 0436  PROCALCITON  --  0.73  LATICACIDVEN 2.0*  --     Recent Results (from the past 240 hour(s))  Blood culture (routine x 2)     Status: None (Preliminary result)   Collection Time: 04/17/21 12:19 AM   Specimen: BLOOD  Result Value Ref Range Status   Specimen Description BLOOD RHAND  Final   Special Requests   Final    BOTTLES DRAWN AEROBIC AND ANAEROBIC Blood Culture adequate volume   Culture   Final    NO GROWTH 4 DAYS Performed at [facility], [address] [place], [place] [address]    Report Status PENDING  Incomplete  Resp Panel by RT-PCR (Flu A&B, Covid) Nasopharyngeal Swab     Status: None   Collection Time: 04/17/21  1:34 AM   Specimen: Nasopharyngeal Swab; Nasopharyngeal(NP) swabs in vial transport medium  Result Value Ref Range Status   SARS Coronavirus 2 by RT PCR NEGATIVE NEGATIVE Final  Comment: (NOTE) SARS-CoV-2 target nucleic acids are NOT DETECTED.  The SARS-CoV-2 RNA is generally detectable in upper respiratory specimens during the acute phase of infection. The lowest concentration of SARS-CoV-2 viral copies this assay can detect is 138 copies/mL. A negative result does not preclude SARS-Cov-2 infection and should not be used as the sole basis for treatment or other patient management decisions. A negative result may occur with  improper specimen collection/handling, submission of specimen other than nasopharyngeal swab, presence of viral mutation(s) within the areas targeted by this assay, and inadequate number of viral copies(<138 copies/mL). A negative result must be combined with clinical observations, patient history, and epidemiological information. The expected result is Negative.  Fact Sheet for Patients:   EntrepreneurPulse.com.au  Fact Sheet for Healthcare Providers:  IncredibleEmployment.be  This test is no t yet approved or cleared by the [place] FDA and  has been authorized for detection and/or diagnosis of SARS-CoV-2 by FDA under an Emergency Use Authorization (EUA). This EUA will remain  in effect (meaning this test can be used) for the duration of the COVID-19 declaration under Section 564(b)(1) of the Act, 21 U.S.C.section 360bbb-3(b)(1), unless the authorization is terminated  or revoked sooner.       Influenza A by PCR NEGATIVE NEGATIVE Final   Influenza B by PCR NEGATIVE NEGATIVE Final    Comment: (NOTE) The Xpert Xpress SARS-CoV-2/FLU/RSV plus assay is intended as an aid in the diagnosis of influenza from Nasopharyngeal swab specimens and should not be used as a sole basis for treatment. Nasal washings and aspirates are unacceptable for Xpert Xpress SARS-CoV-2/FLU/RSV testing.  Fact Sheet for Patients: EntrepreneurPulse.com.au  Fact Sheet for Healthcare Providers: IncredibleEmployment.be  This test is not yet approved or cleared by the [place] FDA and has been authorized for detection and/or diagnosis of SARS-CoV-2 by FDA under an Emergency Use Authorization (EUA). This EUA will remain in effect (meaning this test can be used) for the duration of the COVID-19 declaration under Section 564(b)(1) of the Act, 21 U.S.C. section 360bbb-3(b)(1), unless the authorization is terminated or revoked.  Performed at [facility], [address] [place], [place] [address]   Blood culture (routine x 2)     Status: None (Preliminary result)   Collection Time: 04/17/21  2:25 AM   Specimen: BLOOD  Result Value Ref Range Status   Specimen Description BLOOD BLOOD LEFT HAND  Final   Special Requests   Final    BOTTLES DRAWN AEROBIC AND ANAEROBIC Blood Culture adequate volume   Culture    Final    NO GROWTH 4 DAYS Performed at [facility], [address], [place], [place] [address]    Report Status PENDING  Incomplete  Surgical pcr screen     Status: None   Collection Time: 04/19/21  1:09 PM   Specimen: Nasal Mucosa; Nasal Swab  Result Value Ref Range Status   MRSA, PCR NEGATIVE NEGATIVE Final   Staphylococcus aureus NEGATIVE NEGATIVE Final    Comment: (NOTE) The Xpert SA Assay (FDA approved for NASAL specimens in patients 74 years of age and older), is one component of a comprehensive surveillance program. It is not intended to diagnose infection nor to guide or monitor treatment. Performed at [facility], [address], [place], [place] [address]          Radiology Studies: US ARTERIAL ABI (SCREENING LOWER EXTREMITY)  Result Date: 04/20/2021 CLINICAL DATA:  65 year old female with bilateral foot wounds. EXAM: NONINVASIVE PHYSIOLOGIC VASCULAR STUDY  OF BILATERAL LOWER EXTREMITIES TECHNIQUE: Evaluation of both lower extremities were performed at rest, including calculation of ankle-brachial indices with single level Doppler, pressure and pulse volume recording. COMPARISON:  None. FINDINGS: Right ABI:  0.98 Left ABI:  Noncompressible. Right Lower Extremity: Monophasic waveforms at the level of the ankle, dampened in the posterior tibial artery. Left Lower Extremity: Dampened monophasic waveforms at the level of the ankle. IMPRESSION: Dampened monophasic waveforms in the bilateral lower extremities at the level of the ankle, left greater than right. Noncompressible left ankle-brachial indices and likely falsely elevated, normal (0.98), right lower extremity ankle brachial index. Findings overall suggestive of severe bilateral lower extremity peripheral artery disease, likely with significant runoff vessel involvement. Electronically Signed   By: Ruthann Cancer M.D.   On: 04/20/2021 09:25   NM Bone Scan 3 Phase Lower Extremity  Result Date:  04/19/2021 CLINICAL DATA:  Concern for osteomyelitis ulceration of the LEFT heel. Ulcer of the RIGHT great toe EXAM: NUCLEAR MEDICINE 3-PHASE BONE SCAN TECHNIQUE: Radionuclide angiographic images, immediate static blood pool images were obtained of the 21.9 after intravenous injection of radiopharmaceutical. Delayed imaging was not performed due to patient discomfort. Patient has significant pain during imaging and refused to continue. RADIOPHARMACEUTICALS:  21.9 mCi Tc-20mMDP IV COMPARISON:  Plain films 04/16/2021 FINDINGS: Vascular phase: There is hyperemia to the LEFT heel and RIGHT forefoot Blood pool phase: There is increased blood pool activity in the LEFT heel as well as the RIGHT forefoot and great toe. Delayed phase: Omitted as above. Patient could not complete exam due to discomfort. IMPRESSION: 1. Hyperemia and blood pool activity in the LEFT heel differential including osteomyelitis versus cellulitis. 2. Hyperemia and blood pool activity in the RIGHT forefoot and great toe with differential including osteomyelitis versus cellulitis. 3. Patient could not complete delayed imaging as above. Electronically Signed   By: SSuzy BouchardM.D.   On: 04/19/2021 21:05        Scheduled Meds:  atorvastatin  20 mg Oral Daily   cloNIDine  0.3 mg Oral TID   enoxaparin (LOVENOX) injection  0.5 mg/kg Subcutaneous Q24H   gabapentin  300 mg Oral BID   hydrocerin   Topical Daily   insulin aspart  0-15 Units Subcutaneous TID WC   insulin aspart  0-5 Units Subcutaneous QHS   levothyroxine  112 mcg Oral Q0600   metoprolol succinate  50 mg Oral Q breakfast   Continuous Infusions:  sodium chloride 10 mL/hr at 04/20/21 2304   sodium chloride 75 mL/hr at 04/21/21 0501    ceFAZolin (ANCEF) IV Stopped (04/20/21 2304)     LOS: 4 days    Time spent: 35 minutes with more than 50% on [place] MD [facility]   If 7PM-7AM, please contact night-coverage  04/21/2021, 8:08 AM

## 2021-04-21 NOTE — Progress Notes (Signed)
PODIATRY / FOOT AND ANKLE SURGERY PROGRESS NOTE  Chief Complaint: Foot pain/infection/leg swelling   HPI: [name] is a 65 y.o. female who presents today resting in bed comfortably.  Patient is answering questions and seems to be more understanding today of medical condition and necessity for care.  Patient complains still of pain to bilateral lower extremities.  Patient has kept her dressings clean and intact since last visit and did have dressings changed by nursing staff today.  Patient continues to be nonambulatory in bed today with appropriate offloading.  Patient currently denies nausea, vomiting, fever, chills.  PMHx:  Past Medical History:  Diagnosis Date   Diabetes mellitus without complication (HCC) 2010   Hypertension    Hypothyroidism    Kidney stone 11/2014   history of/STAGE 4 KIDNEY DISEASE PER DR Wynelle Link   Lymphedema    Multiple open wounds of lower leg    PT BEING SEEN BY THE WOUND CENTER FOR CHRONIC BLISTERS PER PT    Surgical Hx:  Past Surgical History:  Procedure Laterality Date   TOTAL HIP ARTHROPLASTY Right 05/28/2015   Procedure: TOTAL HIP ARTHROPLASTY ANTERIOR APPROACH;  Surgeon: Kennedy Bucker, MD;  Location: [facility] ORS;  Service: Orthopedics;  Laterality: Right;    FHx:  Family History  Problem Relation Age of Onset   Cancer Mother    Diabetes Father     Social History:  reports that she has never smoked. She has never used smokeless tobacco. She reports that she does not drink alcohol and does not use drugs.  Allergies: No Known Allergies   Medications Prior to Admission  Medication Sig Dispense Refill   acetaminophen (TYLENOL) 500 MG tablet Take 500-1,000 mg by mouth every 6 (six) hours as needed for mild pain or moderate pain.     aspirin EC 81 MG tablet Take 1 tablet (81 mg total) by mouth daily. 90 tablet 3   atorvastatin (LIPITOR) 20 MG tablet Take 1 tablet (20 mg total) by mouth daily. 90 tablet 1   blood glucose meter kit and supplies 1  each by Other route daily. ONE TOUCH ULTRA METER. Use as directed. E11.29 1 each 0   cloNIDine (CATAPRES) 0.3 MG tablet TAKE ONE TABLET BY MOUTH 3 TIMES DAILY. 270 tablet 1   furosemide (LASIX) 40 MG tablet Take 1 tablet (40 mg total) by mouth 2 (two) times daily. 180 tablet 1   gabapentin (NEURONTIN) 300 MG capsule Take 1 capsule (300 mg total) by mouth 3 (three) times daily as needed (pain). (Patient taking differently: Take 300 mg by mouth at bedtime.) 20 capsule 0   glipiZIDE (GLUCOTROL) 5 MG tablet TAKE 1 TABLET BY MOUTH twice a day 180 tablet 0   glucose blood test strip 1 each by Other route daily. ONE TOUCH ULTRA TEST STRIPS E11.29 100 strip 3   Lancets (ONETOUCH ULTRASOFT) lancets 1 each by Other route daily. Use as instructed E11.29 100 each 3   levothyroxine (SYNTHROID) 112 MCG tablet Take 1 tablet (112 mcg total) by mouth daily before breakfast. 90 tablet 0   metoprolol succinate (TOPROL-XL) 50 MG 24 hr tablet Take 1 tablet (50 mg total) by mouth daily. 90 tablet 1   telmisartan (MICARDIS) 80 MG tablet TAKE ONE TABLET BY MOUTH EVERY DAY 90 tablet 1    Physical Exam: General: Alert and oriented.  No apparent distress.  Vascular: DP/PT pulses unable to be palpated due to bilateral lower extremity swelling, lymphedema appears to be moderate to severe.  Feet do appear to be warm to touch bilateral.  Neuro: Light touch sensation reduced to bilateral lower extremities.  Derm: Large ulcerations to the posterior aspects of both lower legs near the calf area consistent with venous type ulcerations or lymphedema type wounds.  Serous drainage present from these areas, wound beds appear to be granular overall.  Large ulceration to the plantar posterior aspect the left heel which appears to be a pressure type ulceration that probes deep nearly to bone, area appears to be necrotic overall with large amount of fibrous tissue, no granularity noted, mild odor.  Distal tip of the left hallux  appears to have a small superficial necrotic eschar present to the distal tip.  No associated erythema or edema.  No drainage.  Right distal tip hallux with eschar present to the distal tip, no associated erythema or edema, no drainage.  Eschar present to the fourth webspace area of the right foot present to the lateral aspect of the fifth metatarsal phalangeal joint likely due to pressure ulceration.  Appears to have a small opening also at the plantar aspect of the fifth metatarsal phalangeal joint that probes to bone, mild odor present, no erythema or associated edema specifically to this area, mild serous drainage.  Preulcerative lesion to the plantar aspect of the right heel likely due to pressure present with mild hemorrhagic change.  Nails appear to be thickened, relatively well trimmed, discolored, dystrophic and brittle subungual debris.  Skin appears to be thin and atrophic to bilateral lower extremities with large areas of peeling dry skin present.  MSK: 3/5 strength bilateral lower extremity muscle groups.  Pain on palpation to the left heel and right fifth metatarsal phalangeal joint area as well as both big toes.  Pain on palpation of the posterior aspects of both lower legs in the area of large venous type ulcerations.  Results for orders placed or performed during the hospital encounter of 04/16/21 (from the past 48 hour(s))  Glucose, capillary     Status: Abnormal   Collection Time: 04/19/21  4:33 PM  Result Value Ref Range   Glucose-Capillary 200 (H) 70 - 99 mg/dL    Comment: Glucose reference range applies only to samples taken after fasting for at least 8 hours.  Glucose, capillary     Status: Abnormal   Collection Time: 04/19/21  9:28 PM  Result Value Ref Range   Glucose-Capillary 173 (H) 70 - 99 mg/dL    Comment: Glucose reference range applies only to samples taken after fasting for at least 8 hours.   Comment 1 Notify RN   CBC with Differential/Platelet     Status:  Abnormal   Collection Time: 04/20/21  6:05 AM  Result Value Ref Range   WBC 10.6 (H) 4.0 - 10.5 K/uL   RBC 3.62 (L) 3.87 - 5.11 MIL/uL   Hemoglobin 10.8 (L) 12.0 - 15.0 g/dL   HCT 34.1 (L) 36.0 - 46.0 %   MCV 94.2 80.0 - 100.0 fL   MCH 29.8 26.0 - 34.0 pg   MCHC 31.7 30.0 - 36.0 g/dL   RDW 19.4 (H) 11.5 - 15.5 %   Platelets 347 150 - 400 K/uL   nRBC 0.0 0.0 - 0.2 %   Neutrophils Relative % 76 %   Neutro Abs 7.9 (H) 1.7 - 7.7 K/uL   Lymphocytes Relative 7 %   Lymphs Abs 0.8 0.7 - 4.0 K/uL   Monocytes Relative 9 %   Monocytes Absolute 1.0 0.1 - 1.0 K/uL  Eosinophils Relative 4 %   Eosinophils Absolute 0.5 0.0 - 0.5 K/uL   Basophils Relative 1 %   Basophils Absolute 0.1 0.0 - 0.1 K/uL   Immature Granulocytes 3 %   Abs Immature Granulocytes 0.36 (H) 0.00 - 0.07 K/uL    Comment: Performed at [facility], [address] [place], [place] [address]  Basic metabolic panel     Status: Abnormal   Collection Time: 04/20/21  6:05 AM  Result Value Ref Range   Sodium 141 135 - 145 mmol/L   Potassium 4.6 3.5 - 5.1 mmol/L   Chloride 115 (H) 98 - 111 mmol/L   CO2 19 (L) 22 - 32 mmol/L   Glucose, Bld 185 (H) 70 - 99 mg/dL    Comment: Glucose reference range applies only to samples taken after fasting for at least 8 hours.   BUN 34 (H) 8 - 23 mg/dL   Creatinine, Ser 1.34 (H) 0.44 - 1.00 mg/dL   Calcium 8.6 (L) 8.9 - 10.3 mg/dL   GFR, Estimated 43 (L) >60 mL/min    Comment: (NOTE) Calculated using the CKD-EPI Creatinine Equation (2021)    Anion gap 7 5 - 15    Comment: Performed at [facility], [address] [place], [place] [address]  Glucose, capillary     Status: Abnormal   Collection Time: 04/20/21  7:52 AM  Result Value Ref Range   Glucose-Capillary 166 (H) 70 - 99 mg/dL    Comment: Glucose reference range applies only to samples taken after fasting for at least 8 hours.  Glucose, capillary     Status: Abnormal   Collection Time: 04/20/21 11:38 AM  Result  Value Ref Range   Glucose-Capillary 153 (H) 70 - 99 mg/dL    Comment: Glucose reference range applies only to samples taken after fasting for at least 8 hours.  Glucose, capillary     Status: Abnormal   Collection Time: 04/20/21  4:25 PM  Result Value Ref Range   Glucose-Capillary 186 (H) 70 - 99 mg/dL    Comment: Glucose reference range applies only to samples taken after fasting for at least 8 hours.  Glucose, capillary     Status: Abnormal   Collection Time: 04/20/21  9:27 PM  Result Value Ref Range   Glucose-Capillary 212 (H) 70 - 99 mg/dL    Comment: Glucose reference range applies only to samples taken after fasting for at least 8 hours.   Comment 1 Notify RN   CBC with Differential/Platelet     Status: Abnormal   Collection Time: 04/21/21  5:36 AM  Result Value Ref Range   WBC 10.8 (H) 4.0 - 10.5 K/uL   RBC 3.11 (L) 3.87 - 5.11 MIL/uL   Hemoglobin 9.8 (L) 12.0 - 15.0 g/dL   HCT 29.4 (L) 36.0 - 46.0 %   MCV 94.5 80.0 - 100.0 fL   MCH 31.5 26.0 - 34.0 pg   MCHC 33.3 30.0 - 36.0 g/dL   RDW 19.1 (H) 11.5 - 15.5 %   Platelets 310 150 - 400 K/uL   nRBC 0.0 0.0 - 0.2 %   Neutrophils Relative % 75 %   Neutro Abs 8.1 (H) 1.7 - 7.7 K/uL   Lymphocytes Relative 8 %   Lymphs Abs 0.9 0.7 - 4.0 K/uL   Monocytes Relative 9 %   Monocytes Absolute 1.0 0.1 - 1.0 K/uL   Eosinophils Relative 4 %   Eosinophils Absolute 0.4 0.0 - 0.5 K/uL   Basophils  Relative 1 %   Basophils Absolute 0.1 0.0 - 0.1 K/uL   Immature Granulocytes 3 %   Abs Immature Granulocytes 0.30 (H) 0.00 - 0.07 K/uL    Comment: Performed at [facility], [address] [place], [place] [address]  Basic metabolic panel     Status: Abnormal   Collection Time: 04/21/21  5:36 AM  Result Value Ref Range   Sodium 136 135 - 145 mmol/L   Potassium 4.4 3.5 - 5.1 mmol/L   Chloride 113 (H) 98 - 111 mmol/L   CO2 18 (L) 22 - 32 mmol/L   Glucose, Bld 267 (H) 70 - 99 mg/dL    Comment: Glucose reference range applies only  to samples taken after fasting for at least 8 hours.   BUN 30 (H) 8 - 23 mg/dL   Creatinine, Ser 1.51 (H) 0.44 - 1.00 mg/dL   Calcium 7.9 (L) 8.9 - 10.3 mg/dL   GFR, Estimated 38 (L) >60 mL/min    Comment: (NOTE) Calculated using the CKD-EPI Creatinine Equation (2021)    Anion gap 5 5 - 15    Comment: Performed at [facility], [address] [place], [place] [address]  Glucose, capillary     Status: Abnormal   Collection Time: 04/21/21  7:47 AM  Result Value Ref Range   Glucose-Capillary 218 (H) 70 - 99 mg/dL    Comment: Glucose reference range applies only to samples taken after fasting for at least 8 hours.  Glucose, capillary     Status: Abnormal   Collection Time: 04/21/21 11:34 AM  Result Value Ref Range   Glucose-Capillary 173 (H) 70 - 99 mg/dL    Comment: Glucose reference range applies only to samples taken after fasting for at least 8 hours.   US ARTERIAL ABI (SCREENING LOWER EXTREMITY)  Result Date: 04/20/2021 CLINICAL DATA:  65 year old female with bilateral foot wounds. EXAM: NONINVASIVE PHYSIOLOGIC VASCULAR STUDY OF BILATERAL LOWER EXTREMITIES TECHNIQUE: Evaluation of both lower extremities were performed at rest, including calculation of ankle-brachial indices with single level Doppler, pressure and pulse volume recording. COMPARISON:  None. FINDINGS: Right ABI:  0.98 Left ABI:  Noncompressible. Right Lower Extremity: Monophasic waveforms at the level of the ankle, dampened in the posterior tibial artery. Left Lower Extremity: Dampened monophasic waveforms at the level of the ankle. IMPRESSION: Dampened monophasic waveforms in the bilateral lower extremities at the level of the ankle, left greater than right. Noncompressible left ankle-brachial indices and likely falsely elevated, normal (0.98), right lower extremity ankle brachial index. Findings overall suggestive of severe bilateral lower extremity peripheral artery disease, likely with significant runoff vessel  involvement. Electronically Signed   By: Ruthann Cancer M.D.   On: 04/20/2021 09:25   NM Bone Scan 3 Phase Lower Extremity  Result Date: 04/19/2021 CLINICAL DATA:  Concern for osteomyelitis ulceration of the LEFT heel. Ulcer of the RIGHT great toe EXAM: NUCLEAR MEDICINE 3-PHASE BONE SCAN TECHNIQUE: Radionuclide angiographic images, immediate static blood pool images were obtained of the 21.9 after intravenous injection of radiopharmaceutical. Delayed imaging was not performed due to patient discomfort. Patient has significant pain during imaging and refused to continue. RADIOPHARMACEUTICALS:  21.9 mCi Tc-29m MDP IV COMPARISON:  Plain films 04/16/2021 FINDINGS: Vascular phase: There is hyperemia to the LEFT heel and RIGHT forefoot Blood pool phase: There is increased blood pool activity in the LEFT heel as well as the RIGHT forefoot and great toe. Delayed phase: Omitted as above. Patient could not complete exam due to discomfort.  IMPRESSION: 1. Hyperemia and blood pool activity in the LEFT heel differential including osteomyelitis versus cellulitis. 2. Hyperemia and blood pool activity in the RIGHT forefoot and great toe with differential including osteomyelitis versus cellulitis. 3. Patient could not complete delayed imaging as above. Electronically Signed   By: Suzy Bouchard M.D.   On: 04/19/2021 21:05    Blood pressure (!) 178/59, pulse 98, temperature 98.1 F (36.7 C), temperature source Oral, resp. rate 18, height 5' 5" (1.651 m), weight 118.5 kg, SpO2 98 %.   Assessment Osteomyelitis right fifth metatarsal phalangeal joint, right distal hallux.  Concern for possible osteomyelitis left calcaneus. Dry gangrene distal aspects of both big toes Venous/lymphedema type wounds to the posterior aspects of both legs. Diabetes type 2 polyneuropathy PVD Failure to thrive  Plan -Patient seen and examined today. -Patient appears to be more understanding today of medical condition and as to why she is  in the hospital.  Appears to be improving compared to previous mentation. -Discussed with patient the need to perform an angiogram to bilateral lower extremities to improve circulation as patient appears to have fairly significant peripheral vascular disease based on examination, gangrene present, and ABI showing reduced flow. -Unfortunately CT angiogram has been canceled today due to patient's mentation.  Psych consult pending. -Discussed with patient that she likely needs surgical debridement as well as partial amputations of a few digits due to ulcerations and infection present.  Discussed though that patient has poor blood flow and performing procedure right now would be detrimental as she would likely not heal any surgical procedure until blood flow is improved. -At this point patient will likely need wound debridements to the posterior aspects of both lower legs, wound debridement with bone culture of the left heel with likely wound VAC application, partial amputations of the right hallux and right fifth ray.  Discussed with patient that once again she is at high risk for limb loss. -Continue with dressing instructions as ordered. -Wound culture taken today and sent off.  Appreciate medicine recommendations for antibiotic therapy. -Discussed patient's condition with friend, Shelah Lewandowsky, appreciative of care and discussion.  Patient's friend also states the patient's living condition is very poor.  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  Patient's friend does not believe that she should be coming back to this area after discharge or after released from skilled nursing as it is detrimental to the patient's health.  Discussed that we will consider this.  Social worker to work further with patient on placement and long-term goals of care. -Likely that patient will have to see the wound care center after discharge for chronic lower extremity wounds.   She has been treated for these in the past at the wound care center.  We will continue to follow-up with patient after discharge as well. -For now planning waiting to do any surgical procedure until vascular team has improved circulation and medical decision making has been appropriate by patient and released by psych.  Caroline More, DPM 04/21/2021, 1:34 PM

## 2021-04-21 NOTE — Consult Note (Signed)
[facility] Face-to-Face Psychiatry Consult   Reason for Consult: Consult for 65 year old woman with cellulitis of the feet.  Question about capacity. Referring Physician: Kurtis Bushman Patient Identification: [name] MRN:  [id number] Principal Diagnosis: Adjustment disorder with anxiety Diagnosis:  Principal Problem:   Adjustment disorder with anxiety Active Problems:   Sepsis due to cellulitis ([place])   Pressure injury of skin   Total Time spent with patient: 1 hour  Subjective:   [name] is a 65 y.o. female patient admitted with "well it is my feet, mainly".  HPI: Patient seen chart reviewed.  65 year old woman who has chronic cellulitis of both feet.  Brought into the hospital after neighbors became concerned that she was not in contact.  Emergency services found the patient in a chair with feces and urine on her.  Somehow the determination was made that she may have not moved for a couple of days.  Patient is being evaluated among other things for what interventions might possibly be done for her chronic cellulitis.  On interview the patient was cooperative and pleasant.  She was fully alert and oriented.  She was able to describe her medical problems accurately.  She understood that she had chronic infections in her feet.  Patient remembered people coming into her house and bringing her to the hospital.  She denied that she had been in the same chair for days at a time but also acknowledged that her memory was not very good and she was not sure how long she had been asleep.  She mentioned to me that she had recently been started on gabapentin which she feels makes her too tired and confused.  Patient herself spontaneously brought up the idea of an angiogram when I asked her what the team was doing for her.  She was able to accurately describe for me what an angiogram was and what the purpose of it was.  I asked her if she had any concerns about having it done.  She admitted that this  morning she had been grumpy and may have indicated a possible refusal because she said she was upset that she did not feel that the doctors were communicating directly with her well enough.  She also said that her mother had had carotid angiograms and just knowing what that procedure was she had some fear.  She was able however on her own to say that obviously legs and feet are less crucial than brain.  In any case the patient understood that the angiogram needed to be done if any consideration could be made for vascular surgery.  She told me that she did not want to have an amputation and so she supposed that she would probably consent to the angiogram.  When asked about where she will go when she goes home she said she would prefer to go home.  I pointed out that she had not been able to walk which she admitted herself and that she was not taking care of herself well at home.  She acknowledged that as well and said that she would probably be okay at least with rehab.  Patient did not report any mood symptoms.  No psychotic symptoms.  No sign of dangerousness.  Past Psychiatric History: No known past psychiatric history.  No history of substance abuse disorder.  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   Procedure: TOTAL HIP ARTHROPLASTY ANTERIOR APPROACH;  Surgeon: Hessie Knows, MD;  Location: [facility] ORS;  Service: Orthopedics;  Laterality: Right;   Family History:  Family History  Problem  Relation Age of Onset   Cancer Mother    Diabetes Father    Family Psychiatric  History: Unknown Social History:  Social History   Substance and Sexual Activity  Alcohol Use No     Social History   Substance and Sexual Activity  Drug Use No    Social History   Socioeconomic History   Marital status: Single    Spouse name: Not on file   Number of children: 0   Years of education: Not on file   Highest education level: Not on file  Occupational History   Not on file  Tobacco Use   Smoking status: Never   Smokeless tobacco: Never  Vaping Use   Vaping Use: Never used  Substance and Sexual Activity   Alcohol use: No   Drug use: No   Sexual activity: Never  Other Topics Concern   Not on file  Social History Narrative   Pt lives alone   Social Determinants of Health   Financial Resource Strain: Low Risk    Difficulty of Paying Living Expenses: Not very hard  Food Insecurity: No Food Insecurity   Worried About Charity fundraiser in the Last Year: Never true   [place] in the Last Year: Never true  Transportation Needs: No Transportation Needs   Lack of Transportation (Medical): No   Lack of Transportation (Non-Medical): No  Physical Activity: Inactive   Days of Exercise per Week: 0 days   Minutes of Exercise per Session: 0 min  Stress: No Stress Concern Present   Feeling of Stress : Only a little  Social Connections: Socially Isolated   Frequency of Communication with Friends and Family: More than three times a week   Frequency of Social Gatherings with Friends and Family: Three times a week   Attends Religious Services: Never   Active Member of Clubs or Organizations: No   Attends Archivist Meetings: Never   Marital Status: Never married   Additional Social History:    Allergies:  No Known Allergies  Labs:  Results for orders placed or performed during the hospital encounter of 04/16/21 (from the past 48 hour(s))  Glucose, capillary      Status: Abnormal   Collection Time: 04/19/21  9:28 PM  Result Value Ref Range   Glucose-Capillary 173 (H) 70 - 99 mg/dL    Comment: Glucose reference range applies only to samples taken after fasting for at least 8 hours.   Comment 1 Notify RN   CBC with Differential/Platelet     Status: Abnormal   Collection Time: 04/20/21  6:05 AM  Result Value Ref Range   WBC 10.6 (H) 4.0 - 10.5 K/uL   RBC 3.62 (L) 3.87 - 5.11 MIL/uL   Hemoglobin 10.8 (L) 12.0 - 15.0 g/dL   HCT 34.1 (L)  36.0 - 46.0 %   MCV 94.2 80.0 - 100.0 fL   MCH 29.8 26.0 - 34.0 pg   MCHC 31.7 30.0 - 36.0 g/dL   RDW 19.4 (H) 11.5 - 15.5 %   Platelets 347 150 - 400 K/uL   nRBC 0.0 0.0 - 0.2 %   Neutrophils Relative % 76 %   Neutro Abs 7.9 (H) 1.7 - 7.7 K/uL   Lymphocytes Relative 7 %   Lymphs Abs 0.8 0.7 - 4.0 K/uL   Monocytes Relative 9 %   Monocytes Absolute 1.0 0.1 - 1.0 K/uL   Eosinophils Relative 4 %   Eosinophils Absolute 0.5 0.0 - 0.5 K/uL   Basophils Relative 1 %   Basophils Absolute 0.1 0.0 - 0.1 K/uL   Immature Granulocytes 3 %   Abs Immature Granulocytes 0.36 (H) 0.00 - 0.07 K/uL    Comment: Performed at [facility], [address], [place], [place] [address]  Basic metabolic panel     Status: Abnormal   Collection Time: 04/20/21  6:05 AM  Result Value Ref Range   Sodium 141 135 - 145 mmol/L   Potassium 4.6 3.5 - 5.1 mmol/L   Chloride 115 (H) 98 - 111 mmol/L   CO2 19 (L) 22 - 32 mmol/L   Glucose, Bld 185 (H) 70 - 99 mg/dL    Comment: Glucose reference range applies only to samples taken after fasting for at least 8 hours.   BUN 34 (H) 8 - 23 mg/dL   Creatinine, Ser 1.34 (H) 0.44 - 1.00 mg/dL   Calcium 8.6 (L) 8.9 - 10.3 mg/dL   GFR, Estimated 43 (L) >60 mL/min    Comment: (NOTE) Calculated using the CKD-EPI Creatinine Equation (2021)    Anion gap 7 5 - 15    Comment: Performed at [facility], [address] [place], [place] [address]  Glucose, capillary     Status: Abnormal    Collection Time: 04/20/21  7:52 AM  Result Value Ref Range   Glucose-Capillary 166 (H) 70 - 99 mg/dL    Comment: Glucose reference range applies only to samples taken after fasting for at least 8 hours.  Glucose, capillary     Status: Abnormal   Collection Time: 04/20/21 11:38 AM  Result Value Ref Range   Glucose-Capillary 153 (H) 70 - 99 mg/dL    Comment: Glucose reference range applies only to samples taken after fasting for at least 8 hours.  Glucose, capillary     Status: Abnormal   Collection Time: 04/20/21  4:25 PM  Result Value Ref Range   Glucose-Capillary 186 (H) 70 - 99 mg/dL    Comment: Glucose reference range applies only to samples taken after fasting for at least 8 hours.  Glucose, capillary     Status: Abnormal   Collection Time: 04/20/21  9:27 PM  Result Value Ref Range   Glucose-Capillary 212 (H) 70 - 99 mg/dL    Comment: Glucose reference range applies only to samples taken after fasting for at least 8 hours.   Comment 1 Notify RN   CBC with Differential/Platelet     Status: Abnormal   Collection Time: 04/21/21  5:36 AM  Result Value Ref Range   WBC 10.8 (H) 4.0 - 10.5 K/uL   RBC 3.11 (L) 3.87 - 5.11 MIL/uL   Hemoglobin 9.8 (L) 12.0 - 15.0 g/dL   HCT 29.4 (L) 36.0 - 46.0 %   MCV 94.5 80.0 - 100.0 fL   MCH 31.5  26.0 - 34.0 pg   MCHC 33.3 30.0 - 36.0 g/dL   RDW 19.1 (H) 11.5 - 15.5 %   Platelets 310 150 - 400 K/uL   nRBC 0.0 0.0 - 0.2 %   Neutrophils Relative % 75 %   Neutro Abs 8.1 (H) 1.7 - 7.7 K/uL   Lymphocytes Relative 8 %   Lymphs Abs 0.9 0.7 - 4.0 K/uL   Monocytes Relative 9 %   Monocytes Absolute 1.0 0.1 - 1.0 K/uL   Eosinophils Relative 4 %   Eosinophils Absolute 0.4 0.0 - 0.5 K/uL   Basophils Relative 1 %   Basophils Absolute 0.1 0.0 - 0.1 K/uL   Immature Granulocytes 3 %   Abs Immature Granulocytes 0.30 (H) 0.00 - 0.07 K/uL    Comment: Performed at [facility], [address], [place], [place] [address]  Basic metabolic panel      Status: Abnormal   Collection Time: 04/21/21  5:36 AM  Result Value Ref Range   Sodium 136 135 - 145 mmol/L   Potassium 4.4 3.5 - 5.1 mmol/L   Chloride 113 (H) 98 - 111 mmol/L   CO2 18 (L) 22 - 32 mmol/L   Glucose, Bld 267 (H) 70 - 99 mg/dL    Comment: Glucose reference range applies only to samples taken after fasting for at least 8 hours.   BUN 30 (H) 8 - 23 mg/dL   Creatinine, Ser 1.51 (H) 0.44 - 1.00 mg/dL   Calcium 7.9 (L) 8.9 - 10.3 mg/dL   GFR, Estimated 38 (L) >60 mL/min    Comment: (NOTE) Calculated using the CKD-EPI Creatinine Equation (2021)    Anion gap 5 5 - 15    Comment: Performed at [facility], [address] [place], [place] [address]  Glucose, capillary     Status: Abnormal   Collection Time: 04/21/21  7:47 AM  Result Value Ref Range   Glucose-Capillary 218 (H) 70 - 99 mg/dL    Comment: Glucose reference range applies only to samples taken after fasting for at least 8 hours.  Glucose, capillary     Status: Abnormal   Collection Time: 04/21/21 11:34 AM  Result Value Ref Range   Glucose-Capillary 173 (H) 70 - 99 mg/dL    Comment: Glucose reference range applies only to samples taken after fasting for at least 8 hours.  Glucose, capillary     Status: Abnormal   Collection Time: 04/21/21  4:23 PM  Result Value Ref Range   Glucose-Capillary 246 (H) 70 - 99 mg/dL    Comment: Glucose reference range applies only to samples taken after fasting for at least 8 hours.    Current Facility-Administered Medications  Medication Dose Route Frequency Provider Last Rate Last Admin   0.9 %  sodium chloride infusion   Intravenous PRN Ralene Muskrat B, MD 10 mL/hr at 04/20/21 2304 Restarted at 04/20/21 2304   0.9 %  sodium chloride infusion   Intravenous Continuous Stegmayer, Kimberly A, PA-C 75 mL/hr at 04/21/21 0501 Infusion Verify at 04/21/21 0501   acetaminophen (TYLENOL) tablet 650 mg  650 mg Oral Q6H PRN Mansy, Jan A, MD       Or   acetaminophen (TYLENOL)  suppository 650 mg  650 mg Rectal Q6H PRN Mansy, Jan A, MD       amLODipine (NORVASC) tablet 5 mg  5 mg Oral Daily Nolberto Hanlon, MD   5 mg at 04/21/21 1630   atorvastatin (LIPITOR) tablet 20 mg  20  mg Oral Daily Mansy, Jan A, MD   20 mg at 04/21/21 0845   ceFAZolin (ANCEF) IVPB 1 g/50 mL premix  1 g Intravenous Q8H Sreenath, Sudheer B, MD 100 mL/hr at 04/21/21 1639 1 g at 04/21/21 1639   cloNIDine (CATAPRES) tablet 0.3 mg  0.3 mg Oral TID Mansy, Jan A, MD   0.3 mg at 04/21/21 1635   enoxaparin (LOVENOX) injection 60 mg  0.5 mg/kg Subcutaneous Q24H Narda Rutherford, RPH   60 mg at 04/21/21 1630   gabapentin (NEURONTIN) capsule 300 mg  300 mg Oral BID Ralene Muskrat B, MD   300 mg at 04/20/21 2231   hydrALAZINE (APRESOLINE) injection 10 mg  10 mg Intravenous Q4H PRN Sidney Ace, MD       hydrocerin (EUCERIN) cream   Topical Daily Ralene Muskrat B, MD   Given at 04/21/21 [phone]   HYDROmorphone (DILAUDID) injection 0.5 mg  0.5 mg Intravenous Q3H PRN Ralene Muskrat B, MD   0.5 mg at 04/19/21 1122   insulin aspart (novoLOG) injection 0-15 Units  0-15 Units Subcutaneous TID WC Ralene Muskrat B, MD   5 Units at 04/21/21 1631   insulin aspart (novoLOG) injection 0-5 Units  0-5 Units Subcutaneous QHS Ralene Muskrat B, MD   2 Units at 04/20/21 2245   levothyroxine (SYNTHROID) tablet 112 mcg  112 mcg Oral Q0600 Mansy, Jan A, MD   112 mcg at 04/20/21 0540   liver oil-zinc oxide (DESITIN) 40 % ointment   Topical PRN Mansy, Jan A, MD       magnesium hydroxide (MILK OF MAGNESIA) suspension 30 mL  30 mL Oral Daily PRN Mansy, Jan A, MD       metoprolol succinate (TOPROL-XL) 24 hr tablet 50 mg  50 mg Oral Q breakfast Mansy, Jan A, MD   50 mg at 04/21/21 0845   ondansetron (ZOFRAN) tablet 4 mg  4 mg Oral Q6H PRN Mansy, Jan A, MD       Or   ondansetron [facility]) injection 4 mg  4 mg Intravenous Q6H PRN Mansy, Jan A, MD       oxyCODONE (Oxy IR/ROXICODONE) immediate release tablet 5 mg  5 mg Oral Q4H  PRN Ralene Muskrat B, MD   5 mg at 04/19/21 0131   traZODone (DESYREL) tablet 25 mg  25 mg Oral QHS PRN Mansy, Jan A, MD   25 mg at 04/18/21 2121    Musculoskeletal: Strength & Muscle Tone: decreased Gait & Station: unable to stand Patient leans: N/A            Psychiatric Specialty Exam:  Presentation  General Appearance: No data recorded Eye Contact:No data recorded Speech:No data recorded Speech Volume:No data recorded Handedness:No data recorded  Mood and Affect  Mood:No data recorded Affect:No data recorded  Thought Process  Thought Processes:No data recorded Descriptions of Associations:No data recorded Orientation:No data recorded Thought Content:No data recorded History of Schizophrenia/Schizoaffective disorder:No data recorded Duration of Psychotic Symptoms:No data recorded Hallucinations:No data recorded Ideas of Reference:No data recorded Suicidal Thoughts:No data recorded Homicidal Thoughts:No data recorded  Sensorium  Memory:No data recorded Judgment:No data recorded Insight:No data recorded  Executive Functions  Concentration:No data recorded Attention Span:No data recorded Recall:No data recorded Fund of Knowledge:No data recorded Language:No data recorded  Psychomotor Activity  Psychomotor Activity:No data recorded  Assets  Assets:No data recorded  Sleep  Sleep:No data recorded  Physical Exam: Physical Exam Vitals and nursing note reviewed.  Constitutional:  Appearance: Normal appearance.  HENT:     Head: Normocephalic and atraumatic.     Mouth/Throat:     Pharynx: Oropharynx is clear.  Eyes:     Pupils: Pupils are equal, round, and reactive to light.  Cardiovascular:     Rate and Rhythm: Normal rate and regular rhythm.  Pulmonary:     Effort: Pulmonary effort is normal.     Breath sounds: Normal breath sounds.  Abdominal:     General: Abdomen is flat.     Palpations: Abdomen is soft.  Musculoskeletal:         General: Normal range of motion.  Skin:    General: Skin is warm and dry.  Neurological:     General: No focal deficit present.     Mental Status: She is alert. Mental status is at baseline.  Psychiatric:        Attention and Perception: Attention normal.        Mood and Affect: Mood normal.        Speech: Speech normal.        Behavior: Behavior normal.        Thought Content: Thought content normal.        Cognition and Memory: Cognition normal.        Judgment: Judgment normal.   Review of Systems  Constitutional: Negative.   HENT: Negative.    Eyes: Negative.   Respiratory: Negative.    Cardiovascular: Negative.   Gastrointestinal: Negative.   Musculoskeletal: Negative.   Skin: Negative.   Neurological: Negative.   Psychiatric/Behavioral: Negative.    Blood pressure (!) 144/52, pulse 61, temperature 98.4 F (36.9 C), temperature source Oral, resp. rate 18, height 5\' 5"  (1.651 m), weight 118.5 kg, SpO2 98 %. Body mass index is 43.47 kg/m.  Treatment Plan Summary: Plan to my evaluation the patient has full capacity to make medical decisions including decisions about discharge.  She was able to accurately describe the facts of both her medical and home living situation and to articulate the risks and benefits both ways.  I have no concern at all about her capacity or any other acute mental health issue  Disposition: Patient does not meet criteria for psychiatric inpatient admission. See note  Alethia Berthold, MD 04/21/2021 7:26 PM

## 2021-04-22 DIAGNOSIS — F4322 Adjustment disorder with anxiety: Secondary | ICD-10-CM

## 2021-04-22 LAB — BASIC METABOLIC PANEL WITH GFR
Anion gap: 9 (ref 5–15)
BUN: 26 mg/dL — ABNORMAL HIGH (ref 8–23)
CO2: 19 mmol/L — ABNORMAL LOW (ref 22–32)
Calcium: 8.4 mg/dL — ABNORMAL LOW (ref 8.9–10.3)
Chloride: 110 mmol/L (ref 98–111)
Creatinine, Ser: 1.37 mg/dL — ABNORMAL HIGH (ref 0.44–1.00)
GFR, Estimated: 42 mL/min — ABNORMAL LOW
Glucose, Bld: 220 mg/dL — ABNORMAL HIGH (ref 70–99)
Potassium: 4.5 mmol/L (ref 3.5–5.1)
Sodium: 138 mmol/L (ref 135–145)

## 2021-04-22 LAB — CULTURE, BLOOD (ROUTINE X 2)
Culture: NO GROWTH
Culture: NO GROWTH
Special Requests: ADEQUATE
Special Requests: ADEQUATE

## 2021-04-22 LAB — CBC WITH DIFFERENTIAL/PLATELET
Abs Immature Granulocytes: 0.49 10*3/uL — ABNORMAL HIGH (ref 0.00–0.07)
Basophils Absolute: 0.1 10*3/uL (ref 0.0–0.1)
Basophils Relative: 1 %
Eosinophils Absolute: 0.7 10*3/uL — ABNORMAL HIGH (ref 0.0–0.5)
Eosinophils Relative: 5 %
HCT: 33.8 % — ABNORMAL LOW (ref 36.0–46.0)
Hemoglobin: 10.8 g/dL — ABNORMAL LOW (ref 12.0–15.0)
Immature Granulocytes: 3 %
Lymphocytes Relative: 8 %
Lymphs Abs: 1.1 10*3/uL (ref 0.7–4.0)
MCH: 29.9 pg (ref 26.0–34.0)
MCHC: 32 g/dL (ref 30.0–36.0)
MCV: 93.6 fL (ref 80.0–100.0)
Monocytes Absolute: 1.3 10*3/uL — ABNORMAL HIGH (ref 0.1–1.0)
Monocytes Relative: 9 %
Neutro Abs: 10.9 10*3/uL — ABNORMAL HIGH (ref 1.7–7.7)
Neutrophils Relative %: 74 %
Platelets: 329 10*3/uL (ref 150–400)
RBC: 3.61 MIL/uL — ABNORMAL LOW (ref 3.87–5.11)
RDW: 18.8 % — ABNORMAL HIGH (ref 11.5–15.5)
WBC: 14.7 10*3/uL — ABNORMAL HIGH (ref 4.0–10.5)
nRBC: 0 % (ref 0.0–0.2)

## 2021-04-22 LAB — GLUCOSE, CAPILLARY
Glucose-Capillary: 199 mg/dL — ABNORMAL HIGH (ref 70–99)
Glucose-Capillary: 180 mg/dL — ABNORMAL HIGH (ref 70–99)
Glucose-Capillary: 185 mg/dL — ABNORMAL HIGH (ref 70–99)
Glucose-Capillary: 174 mg/dL — ABNORMAL HIGH (ref 70–99)

## 2021-04-22 MED ORDER — HYDROCORTISONE 1 % EX CREA
TOPICAL_CREAM | Freq: Three times a day (TID) | CUTANEOUS | Status: DC | PRN
  Filled 2021-04-22 (×3): qty 28

## 2021-04-22 MED ORDER — BISMUTH SUBSALICYLATE 262 MG/15ML PO SUSP
30.0000 mL | ORAL | Status: DC | PRN
  Administered 2021-04-23 (×2): 30 mL via ORAL
  Filled 2021-04-22 (×2): qty 118

## 2021-04-22 MED ORDER — DIPHENHYDRAMINE-ZINC ACETATE 2-0.1 % EX CREA: TOPICAL_CREAM | Freq: Three times a day (TID) | CUTANEOUS | Status: DC | PRN

## 2021-04-22 NOTE — Progress Notes (Signed)
PROGRESS NOTE    [name]  MJM:[id number] DOB: 1955-10-08 DOA: 04/16/2021 PCP: Duanne Limerick, MD    Brief Narrative:  unfortunate 65 year old female with extensive past medical history who was brought in via EMS for evaluation of bilateral lower extremity redness swelling and pain.  Apparently patient has been in her wheelchair at home unable to move for several days.  Lower extremity significant cellulitic changes.  On broad-spectrum antibiotics.  Also some question of fracture in the feet on the right lower extremity.    Podiatry consulted on admission, recommendations appreciated.  Concern regarding vascular supply lower extremities.  Podiatry apparently reached out to vascular surgery for consultation prior to any surgical intervention.  Awaiting vascular evaluation to assess lower extremity vasculopathy.   Patient remains hemodynamically stable.  Remains on broad-spectrum antibiotics, IV fluids, as needed pain control.  Appreciate WOC consult.  Appreciate vascular evaluation and recommendations.  ABI completed with results significant for severe bilateral peripheral arterial disease.  Pending podiatry follow-up.  10/26 angiogram cancelled. Concern if pt is competent to make decisions.psych consulted 10/27 psychiatry evaluated patient and found her to be competent in decision making.  Plan for angiogram possibly tomorrow.   Assessment & Plan:   Principal Problem:   Adjustment disorder with anxiety Active Problems:   Sepsis due to cellulitis (HCC)   Pressure injury of skin  Severe bilateral lower extremity cellulitis, nonpurulent Sepsis secondary to above Sepsis criteria met with tachycardia and leukocytosis.  Presumed source bilateral lower extremity nonpurulent cellulitis Concern regarding vascular supply Podiatry and vascular consulted MRSA screen negative 7/26 vancomycin and cefepime were discontinued.  Patient was started on Ancef for nonpurulent  cellulitis Podiatry and vascular following 10/27 angiogram possibly tomorrow   Acute kidney injury on chronic kidney disease stage IIIb ATN versus prerenal azotemia 10/27 continue to hold telmisartan  continue to hold telmisartan Monitor renal function renal function improving continue to monitor   Hyperkalemia Improved  Hypothyroidism Continue Synthroid    Type 2 diabetes mellitus with peripheral neuropathy Oral medications on hold Sliding scale coverage Carb modified diet Continue Neurontin  Essential hypertension PTA clonidine and Toprol-XL  Home ARB on hold IV hydralazine as needed 10/26 mildly elevated.  Will add amlodipine 5 mg daily  Morbid obesity BMI 43.47 This complicates overall care and prognosis Nutrition consult  Poor self-care TOC consult DSS evaluation pending   DVT prophylaxis: SQ Lovenox Code Status: Full Family Communication: None at bedside Disposition Plan: Status is: Inpatient  Remains inpatient appropriate because: Sepsis secondary to bilateral lower extremity cellulitis.  Pending podiatry follow-up.  Angiogram possibly tomorrow      Level of care: Med-Surg  Consultants:  Podiatry Vascular surgery  Procedures:  None  Antimicrobials: Cefazolin, started 10/25   Subjective: Had episode of small amount of diarrhea.  When I went to see the patient and her stool was more on the soft side.  No abdominal pain, nausea or vomiting   Objective: Vitals:   04/21/21 2206 04/22/21 0235 04/22/21 0412 04/22/21 0828  BP: (!) 156/52 (!) 155/61 (!) 159/57 (!) 165/66  Pulse: (!) 58 (!) 55 (!) 51 (!) 57  Resp: 16 16 16 18   Temp:   98.3 F (36.8 C) 99.4 F (37.4 C)  TempSrc:   Oral Oral  SpO2: 97% 97% 98% 100%  Weight:      Height:        Intake/Output Summary (Last 24 hours) at 04/22/2021 1228 Last data filed at 04/22/2021 0500 Gross per 24 hour  Intake --  Output 500 ml  Net -500 ml   Filed Weights   04/16/21 2244 04/17/21  0403  Weight: 122.5 kg 118.5 kg    Examination:  NAD, calm CTA no wheeze rales Regular S1-S2 no gallops Soft benign positive bowel sounds Bilateral dressings in place.  Bilateral lower extremity edema Aaxox3 Mood and affect appropriate in current setting   Data Reviewed: I have personally reviewed following labs and imaging studies  CBC: Recent Labs  Lab 04/18/21 0525 04/19/21 0618 04/20/21 0605 04/21/21 0536 04/22/21 0727  WBC 10.5 10.3 10.6* 10.8* 14.7*  NEUTROABS 8.6* 7.9* 7.9* 8.1* 10.9*  HGB 9.2* 9.5* 10.8* 9.8* 10.8*  HCT 28.9* 30.6* 34.1* 29.4* 33.8*  MCV 94.1 95.6 94.2 94.5 93.6  PLT 270 287 347 310 697   Basic Metabolic Panel: Recent Labs  Lab 04/17/21 0019 04/17/21 0436 04/18/21 0525 04/19/21 0618 04/20/21 0605 04/21/21 0536 04/22/21 0727  NA 141   < > 140 141 141 136 138  K 5.6*   < > 4.1 4.4 4.6 4.4 4.5  CL 114*   < > 118* 115* 115* 113* 110  CO2 14*   < > 17* 19* 19* 18* 19*  GLUCOSE 120*   < > 192* 206* 185* 267* 220*  BUN 60*   < > 47* 41* 34* 30* 26*  CREATININE 2.13*   < > 1.37* 1.57* 1.34* 1.51* 1.37*  CALCIUM 9.0   < > 8.0* 8.3* 8.6* 7.9* 8.4*  MG 2.4  --   --   --   --   --   --    < > = values in this interval not displayed.   GFR: Estimated Creatinine Clearance: 51.3 mL/min (A) (by C-G formula based on SCr of 1.37 mg/dL (H)). Liver Function Tests: Recent Labs  Lab 04/17/21 0019  AST 25  ALT 15  ALKPHOS 74  BILITOT 0.9  PROT 8.6*  ALBUMIN 3.2*   No results for input(s): LIPASE, AMYLASE in the last 168 hours. No results for input(s): AMMONIA in the last 168 hours. Coagulation Profile: Recent Labs  Lab 04/17/21 0019 04/17/21 0436  INR 1.3* 1.4*   Cardiac Enzymes: Recent Labs  Lab 04/17/21 0019  CKTOTAL 156   BNP (last 3 results) No results for input(s): PROBNP in the last 8760 hours. HbA1C: No results for input(s): HGBA1C in the last 72 hours.  CBG: Recent Labs  Lab 04/21/21 1134 04/21/21 1623 04/21/21 2106  04/22/21 0812 04/22/21 1158  GLUCAP 173* 246* 275* 199* 180*   Lipid Profile: No results for input(s): CHOL, HDL, LDLCALC, TRIG, CHOLHDL, LDLDIRECT in the last 72 hours. Thyroid Function Tests: No results for input(s): TSH, T4TOTAL, FREET4, T3FREE, THYROIDAB in the last 72 hours. Anemia Panel: No results for input(s): VITAMINB12, FOLATE, FERRITIN, TIBC, IRON, RETICCTPCT in the last 72 hours. Sepsis Labs: Recent Labs  Lab 04/17/21 0019 04/17/21 0436  PROCALCITON  --  0.73  LATICACIDVEN 2.0*  --     Recent Results (from the past 240 hour(s))  Blood culture (routine x 2)     Status: None   Collection Time: 04/17/21 12:19 AM   Specimen: BLOOD  Result Value Ref Range Status   Specimen Description BLOOD RHAND  Final   Special Requests   Final    BOTTLES DRAWN AEROBIC AND ANAEROBIC Blood Culture adequate volume   Culture   Final    NO GROWTH 5 DAYS Performed at [facility], [address] [place], [place] [address]  Report Status 04/22/2021 FINAL  Final  Resp Panel by RT-PCR (Flu A&B, Covid) Nasopharyngeal Swab     Status: None   Collection Time: 04/17/21  1:34 AM   Specimen: Nasopharyngeal Swab; Nasopharyngeal(NP) swabs in vial transport medium  Result Value Ref Range Status   SARS Coronavirus 2 by RT PCR NEGATIVE NEGATIVE Final    Comment: (NOTE) SARS-CoV-2 target nucleic acids are NOT DETECTED.  The SARS-CoV-2 RNA is generally detectable in upper respiratory specimens during the acute phase of infection. The lowest concentration of SARS-CoV-2 viral copies this assay can detect is 138 copies/mL. A negative result does not preclude SARS-Cov-2 infection and should not be used as the sole basis for treatment or other patient management decisions. A negative result may occur with  improper specimen collection/handling, submission of specimen other than nasopharyngeal swab, presence of viral mutation(s) within the areas targeted by this assay, and inadequate  number of viral copies(<138 copies/mL). A negative result must be combined with clinical observations, patient history, and epidemiological information. The expected result is Negative.  Fact Sheet for Patients:  EntrepreneurPulse.com.au  Fact Sheet for Healthcare Providers:  IncredibleEmployment.be  This test is no t yet approved or cleared by the [place] FDA and  has been authorized for detection and/or diagnosis of SARS-CoV-2 by FDA under an Emergency Use Authorization (EUA). This EUA will remain  in effect (meaning this test can be used) for the duration of the COVID-19 declaration under Section 564(b)(1) of the Act, 21 U.S.C.section 360bbb-3(b)(1), unless the authorization is terminated  or revoked sooner.       Influenza A by PCR NEGATIVE NEGATIVE Final   Influenza B by PCR NEGATIVE NEGATIVE Final    Comment: (NOTE) The Xpert Xpress SARS-CoV-2/FLU/RSV plus assay is intended as an aid in the diagnosis of influenza from Nasopharyngeal swab specimens and should not be used as a sole basis for treatment. Nasal washings and aspirates are unacceptable for Xpert Xpress SARS-CoV-2/FLU/RSV testing.  Fact Sheet for Patients: EntrepreneurPulse.com.au  Fact Sheet for Healthcare Providers: IncredibleEmployment.be  This test is not yet approved or cleared by the [place] FDA and has been authorized for detection and/or diagnosis of SARS-CoV-2 by FDA under an Emergency Use Authorization (EUA). This EUA will remain in effect (meaning this test can be used) for the duration of the COVID-19 declaration under Section 564(b)(1) of the Act, 21 U.S.C. section 360bbb-3(b)(1), unless the authorization is terminated or revoked.  Performed at [facility], [address] [place], [place] [address]   Blood culture (routine x 2)     Status: None   Collection Time: 04/17/21  2:25 AM   Specimen: BLOOD   Result Value Ref Range Status   Specimen Description BLOOD BLOOD LEFT HAND  Final   Special Requests   Final    BOTTLES DRAWN AEROBIC AND ANAEROBIC Blood Culture adequate volume   Culture   Final    NO GROWTH 5 DAYS Performed at [facility], [address], [place], [place] [address]    Report Status 04/22/2021 FINAL  Final  Surgical pcr screen     Status: None   Collection Time: 04/19/21  1:09 PM   Specimen: Nasal Mucosa; Nasal Swab  Result Value Ref Range Status   MRSA, PCR NEGATIVE NEGATIVE Final   Staphylococcus aureus NEGATIVE NEGATIVE Final    Comment: (NOTE) The Xpert SA Assay (FDA approved for NASAL specimens in patients 36 years of age and older), is one component of a comprehensive surveillance program.  It is not intended to diagnose infection nor to guide or monitor treatment. Performed at [facility], [address] [place], [place] [address]   Aerobic/Anaerobic Culture w Gram Stain (surgical/deep wound)     Status: None (Preliminary result)   Collection Time: 04/21/21  6:20 PM   Specimen: Wound  Result Value Ref Range Status   Specimen Description   Final    WOUND Performed at [facility], [address], [place], [place] [address]    Special Requests   Final    NONE Performed at [facility], [address] [place], [place] [address]    Gram Stain NO WBC SEEN NO ORGANISMS SEEN   Final   Culture   Final    TOO YOUNG TO READ Performed at [facility], [place] [address] [place], [place] [address]    Report Status PENDING  Incomplete         Radiology Studies: No results found.      Scheduled Meds:  amLODipine  5 mg Oral Daily   atorvastatin  20 mg Oral Daily   cloNIDine  0.3 mg Oral TID   enoxaparin (LOVENOX) injection  0.5 mg/kg Subcutaneous Q24H   gabapentin  300 mg Oral BID   hydrocerin   Topical Daily   insulin aspart  0-15 Units Subcutaneous TID WC   insulin aspart  0-5 Units  Subcutaneous QHS   levothyroxine  112 mcg Oral Q0600   metoprolol succinate  50 mg Oral Q breakfast   Continuous Infusions:  sodium chloride 10 mL/hr at 04/21/21 2217   sodium chloride 75 mL/hr at 04/21/21 0501    ceFAZolin (ANCEF) IV 1 g (04/22/21 0554)     LOS: 5 days    Time spent: 35 minutes with more than 50% on [place], MD [facility]   If 7PM-7AM, please contact night-coverage  04/22/2021, 12:28 PM

## 2021-04-22 NOTE — Progress Notes (Signed)
Patient refused to be cleaned up after having bowel movement. Nurse and tech offered multiple times. States she does not want to be moved around and is tired. Wants to be cleaned up in the morning. Told patient to call us if she decided to be cleaned up sooner. Patient also refusing to do Q2 turns.

## 2021-04-22 NOTE — Progress Notes (Signed)
[facility]  Daily Progress Note   Subjective  -   Patient remains anxious.  No major events.  Psych has seen and states she has decision making capacity for consent  Objective Vitals:   04/21/21 2206 04/22/21 0235 04/22/21 0412 04/22/21 0828  BP: (!) 156/52 (!) 155/61 (!) 159/57 (!) 165/66  Pulse: (!) 58 (!) 55 (!) 51 (!) 57  Resp: 16 16 16 18   Temp:   98.3 F (36.8 C) 99.4 F (37.4 C)  TempSrc:   Oral Oral  SpO2: 97% 97% 98% 100%  Weight:      Height:        Intake/Output Summary (Last 24 hours) at 04/22/2021 1554 Last data filed at 04/22/2021 0500 Gross per 24 hour  Intake --  Output 500 ml  Net -500 ml    PULM  CTAB CV       Bradycardic VASC  Both feet and legs dressed.  Laboratory CBC    Component Value Date/Time   WBC 14.7 (H) 04/22/2021 0727   HGB 10.8 (L) 04/22/2021 0727   HGB 12.5 02/20/2012 0737   HCT 33.8 (L) 04/22/2021 0727   HCT 37.6 02/20/2012 0737   PLT 329 04/22/2021 0727   PLT 230 02/20/2012 0737    BMET    Component Value Date/Time   NA 138 04/22/2021 0727   NA 137 10/22/2020 1450   NA 139 02/20/2012 0737   K 4.5 04/22/2021 0727   K 4.7 02/20/2012 0737   CL 110 04/22/2021 0727   CL 103 02/20/2012 0737   CO2 19 (L) 04/22/2021 0727   CO2 27 02/20/2012 0737   GLUCOSE 220 (H) 04/22/2021 0727   GLUCOSE 123 (H) 02/20/2012 0737   BUN 26 (H) 04/22/2021 0727   BUN 27 10/22/2020 1450   BUN 26 (H) 02/20/2012 0737   CREATININE 1.37 (H) 04/22/2021 0727   CREATININE 1.31 (H) 02/20/2012 0737   CALCIUM 8.4 (L) 04/22/2021 0727   CALCIUM 9.3 02/20/2012 0737   GFRNONAA 42 (L) 04/22/2021 0727   GFRNONAA 45 (L) 02/20/2012 0737   GFRAA 39 (L) 04/30/2020 1408   GFRAA 52 (L) 02/20/2012 0737    Assessment/Planning:   PAD with ulceration. Psych has cleared and I have discussed with her and she is agreeable to proceed tomorrow with angiogram   Leotis Pain  04/22/2021, 3:54 PM

## 2021-04-22 NOTE — Progress Notes (Signed)
Inpatient Diabetes Program Recommendations  AACE/ADA: New Consensus Statement on Inpatient Glycemic Control (2015)  Target Ranges:  Prepandial:   less than 140 mg/dL      Peak postprandial:   less than 180 mg/dL (1-2 hours)      Critically ill patients:  140 - 180 mg/dL  Results for [name], [name] (MRN [id number]) as of 04/22/2021 08:58  Ref. Range 04/21/2021 07:47 04/21/2021 11:34 04/21/2021 16:23 04/21/2021 21:06  Glucose-Capillary Latest Ref Range: 70 - 99 mg/dL 218 (H) 173 (H) 246 (H) 275 (H)  Results for [name], [name] (MRN [id number]) as of 04/22/2021 08:58  Ref. Range 04/22/2021 08:12  Glucose-Capillary Latest Ref Range: 70 - 99 mg/dL 199 (H)  Results for [name], [name] (MRN [id number]) as of 04/22/2021 08:58  Ref. Range 04/19/2021 06:18  Hemoglobin A1C Latest Ref Range: 4.8 - 5.6 % 5.3    Home DM Meds: Glipizide 5 mg BID  Current Orders: Novolog Moderate Correction Scale/ SSI (0-15 units) TID AC + HS    MD- Note CBGs elevated >200.  Home oral meds are on hold.  Please consider starting low dose basal insulin while home Glipizide is on hold.  Current A1c of 5.3% shows very good control at home with oral meds alone.  Semglee 8 units Daily (0.075 units/kg)    --Will follow patient during hospitalization--  Wyn Quaker RN, MSN, CDE Diabetes Coordinator Inpatient Glycemic Control Team Team Pager: [phone] (8a-5p)

## 2021-04-22 NOTE — Care Management Important Message (Signed)
Important Message  Patient Details  Name: [name] MRN: [id number] Date of Birth: [birth year]   Medicare Important Message Given:  Other (see comment)  Attempted to obtain consent for Medicare IM.  Patient asleep upon visit.  Will attempt at later time.   Dannette Barbara 04/22/2021, 3:11 PM

## 2021-04-23 DIAGNOSIS — F4322 Adjustment disorder with anxiety: Secondary | ICD-10-CM | POA: Diagnosis not present

## 2021-04-23 LAB — CBC WITH DIFFERENTIAL/PLATELET
Abs Immature Granulocytes: 0.31 10*3/uL — ABNORMAL HIGH (ref 0.00–0.07)
Basophils Absolute: 0.1 10*3/uL (ref 0.0–0.1)
Basophils Relative: 1 %
Eosinophils Absolute: 0.6 10*3/uL — ABNORMAL HIGH (ref 0.0–0.5)
Eosinophils Relative: 5 %
HCT: 33.7 % — ABNORMAL LOW (ref 36.0–46.0)
Hemoglobin: 11.4 g/dL — ABNORMAL LOW (ref 12.0–15.0)
Immature Granulocytes: 2 %
Lymphocytes Relative: 7 %
Lymphs Abs: 0.9 10*3/uL (ref 0.7–4.0)
MCH: 31.8 pg (ref 26.0–34.0)
MCHC: 33.8 g/dL (ref 30.0–36.0)
MCV: 93.9 fL (ref 80.0–100.0)
Monocytes Absolute: 1.1 10*3/uL — ABNORMAL HIGH (ref 0.1–1.0)
Monocytes Relative: 8 %
Neutro Abs: 10.2 10*3/uL — ABNORMAL HIGH (ref 1.7–7.7)
Neutrophils Relative %: 77 %
Platelets: 331 10*3/uL (ref 150–400)
RBC: 3.59 MIL/uL — ABNORMAL LOW (ref 3.87–5.11)
RDW: 18.3 % — ABNORMAL HIGH (ref 11.5–15.5)
WBC: 13.2 10*3/uL — ABNORMAL HIGH (ref 4.0–10.5)
nRBC: 0 % (ref 0.0–0.2)

## 2021-04-23 LAB — GLUCOSE, CAPILLARY
Glucose-Capillary: 184 mg/dL — ABNORMAL HIGH (ref 70–99)
Glucose-Capillary: 172 mg/dL — ABNORMAL HIGH (ref 70–99)
Glucose-Capillary: 168 mg/dL — ABNORMAL HIGH (ref 70–99)
Glucose-Capillary: 244 mg/dL — ABNORMAL HIGH (ref 70–99)

## 2021-04-23 MED ORDER — SODIUM CHLORIDE 0.9 % IV SOLN: INTRAVENOUS | Status: DC

## 2021-04-23 MED ORDER — RISAQUAD PO CAPS
2.0000 | ORAL_CAPSULE | Freq: Every day | ORAL | Status: DC
  Administered 2021-04-23 – 2021-05-18 (×24): 2 via ORAL
  Filled 2021-04-23 (×26): qty 2

## 2021-04-23 NOTE — Progress Notes (Addendum)
PODIATRY / FOOT AND ANKLE SURGERY PROGRESS NOTE  Chief Complaint: Foot pain/infection/leg swelling   HPI: [name] is a 65 y.o. female who presents today resting in bed comfortably.  Patient is answering questions and seems to be more understanding today of medical condition and necessity for care. Patient continues to be nonambulatory in bed today with appropriate offloading.  Patient currently denies nausea, vomiting, fever, chills.  PMHx:  Past Medical History:  Diagnosis Date   Diabetes mellitus without complication ([place]) 0277   Hypertension    Hypothyroidism    Kidney stone 11/2014   history of/STAGE 4 KIDNEY DISEASE PER DR Juleen China   Lymphedema    Multiple open wounds of lower leg    PT BEING SEEN BY THE [facility]OUND [facility] FOR CHRONIC BLISTERS PER PT    Surgical Hx:  Past Surgical History:  Procedure Laterality Date   TOTAL HIP ARTHROPLASTY Right 05/28/2015   Procedure: TOTAL HIP ARTHROPLASTY ANTERIOR APPROACH;  Surgeon: Hessie Knows, MD;  Location: [facility] ORS;  Service: Orthopedics;  Laterality: Right;    FHx:  Family History  Problem Relation Age of Onset   Cancer Mother    Diabetes Father     Social History:  reports that she has never smoked. She has never used smokeless tobacco. She reports that she does not drink alcohol and does not use drugs.  Allergies: No Known Allergies   Medications Prior to Admission  Medication Sig Dispense Refill   acetaminophen (TYLENOL) 500 MG tablet Take 500-1,000 mg by mouth every 6 (six) hours as needed for mild pain or moderate pain.     aspirin EC 81 MG tablet Take 1 tablet (81 mg total) by mouth daily. 90 tablet 3   atorvastatin (LIPITOR) 20 MG tablet Take 1 tablet (20 mg total) by mouth daily. 90 tablet 1   blood glucose meter kit and supplies 1 each by Other route daily. ONE TOUCH ULTRA METER. Use as directed. E11.29 1 each 0   cloNIDine (CATAPRES) 0.3 MG tablet TAKE ONE TABLET BY MOUTH 3 TIMES DAILY. 270 tablet 1   furosemide  (LASIX) 40 MG tablet Take 1 tablet (40 mg total) by mouth 2 (two) times daily. 180 tablet 1   gabapentin (NEURONTIN) 300 MG capsule Take 1 capsule (300 mg total) by mouth 3 (three) times daily as needed (pain). (Patient taking differently: Take 300 mg by mouth at bedtime.) 20 capsule 0   glipiZIDE (GLUCOTROL) 5 MG tablet TAKE 1 TABLET BY MOUTH twice a day 180 tablet 0   glucose blood test strip 1 each by Other route daily. ONE TOUCH ULTRA TEST STRIPS E11.29 100 strip 3   Lancets (ONETOUCH ULTRASOFT) lancets 1 each by Other route daily. Use as instructed E11.29 100 each 3   levothyroxine (SYNTHROID) 112 MCG tablet Take 1 tablet (112 mcg total) by mouth daily before breakfast. 90 tablet 0   metoprolol succinate (TOPROL-XL) 50 MG 24 hr tablet Take 1 tablet (50 mg total) by mouth daily. 90 tablet 1   telmisartan (MICARDIS) 80 MG tablet TAKE ONE TABLET BY MOUTH EVERY DAY 90 tablet 1    Physical Exam: General: Alert and oriented.  No apparent distress.  Vascular: DP/PT pulses unable to be palpated due to bilateral lower extremity swelling, lymphedema appears to be moderate to severe.  Feet do appear to be warm to touch bilateral.  Neuro: Light touch sensation reduced to bilateral lower extremities.  Derm: Large ulcerations to the posterior aspects of both lower legs near  the calf area consistent with venous type ulcerations or lymphedema type wounds.  Serous drainage present from these areas, wound beds appear to be granular overall.  Large ulceration to the plantar posterior aspect the left heel which appears to be a pressure type ulceration that probes deep nearly to bone, area appears to be necrotic overall with large amount of fibrous tissue, no granularity noted, mild odor.  Distal tip of the left hallux appears to have a small superficial necrotic eschar present to the distal tip.  No associated erythema or edema.  No drainage.  Right distal tip hallux with eschar present to the distal tip,  no associated erythema or edema, no drainage.  Eschar present to the fourth webspace area of the right foot present to the lateral aspect of the fifth metatarsal phalangeal joint likely due to pressure ulceration.  Appears to have a small opening also at the plantar aspect of the fifth metatarsal phalangeal joint that probes to bone, mild odor present, no erythema or associated edema specifically to this area, mild serous drainage.  Preulcerative lesion to the plantar aspect of the right heel likely due to pressure present with mild hemorrhagic change.  Nails appear to be thickened, relatively well trimmed, discolored, dystrophic and brittle subungual debris.  Skin appears to be thin and atrophic to bilateral lower extremities with large areas of peeling dry skin present.  MSK: 3/5 strength bilateral lower extremity muscle groups.  Pain on palpation to the left heel and right fifth metatarsal phalangeal joint area as well as both big toes.  Pain on palpation of the posterior aspects of both lower legs in the area of large venous type ulcerations.  Results for orders placed or performed during the hospital encounter of 04/16/21 (from the past 48 hour(s))  Glucose, capillary     Status: Abnormal   Collection Time: 04/21/21  4:23 PM  Result Value Ref Range   Glucose-Capillary 246 (H) 70 - 99 mg/dL    Comment: Glucose reference range applies only to samples taken after fasting for at least 8 hours.  Aerobic/Anaerobic Culture w Gram Stain (surgical/deep wound)     Status: None (Preliminary result)   Collection Time: 04/21/21  6:20 PM   Specimen: Wound  Result Value Ref Range   Specimen Description      WOUND Performed at [facility], [address] [place], [place] [address]    Special Requests      NONE Performed at [facility], [place], [place] [address]    Gram Stain      NO WBC SEEN NO ORGANISMS SEEN Performed at [facility], [place]  [address], [place], [place] [address]    Culture ABUNDANT PROTEUS VULGARIS    Report Status PENDING   Glucose, capillary     Status: Abnormal   Collection Time: 04/21/21  9:06 PM  Result Value Ref Range   Glucose-Capillary 275 (H) 70 - 99 mg/dL    Comment: Glucose reference range applies only to samples taken after fasting for at least 8 hours.   Comment 1 Notify RN   CBC with Differential/Platelet     Status: Abnormal   Collection Time: 04/22/21  7:27 AM  Result Value Ref Range   WBC 14.7 (H) 4.0 - 10.5 K/uL   RBC 3.61 (L) 3.87 - 5.11 MIL/uL   Hemoglobin 10.8 (L) 12.0 - 15.0 g/dL   HCT 33.8 (L) 36.0 - 46.0 %   MCV 93.6 80.0 - 100.0  fL   MCH 29.9 26.0 - 34.0 pg   MCHC 32.0 30.0 - 36.0 g/dL   RDW 18.8 (H) 11.5 - 15.5 %   Platelets 329 150 - 400 K/uL   nRBC 0.0 0.0 - 0.2 %   Neutrophils Relative % 74 %   Neutro Abs 10.9 (H) 1.7 - 7.7 K/uL   Lymphocytes Relative 8 %   Lymphs Abs 1.1 0.7 - 4.0 K/uL   Monocytes Relative 9 %   Monocytes Absolute 1.3 (H) 0.1 - 1.0 K/uL   Eosinophils Relative 5 %   Eosinophils Absolute 0.7 (H) 0.0 - 0.5 K/uL   Basophils Relative 1 %   Basophils Absolute 0.1 0.0 - 0.1 K/uL   Immature Granulocytes 3 %   Abs Immature Granulocytes 0.49 (H) 0.00 - 0.07 K/uL    Comment: Performed at [facility], [address] [place], [place] [address]  Basic metabolic panel     Status: Abnormal   Collection Time: 04/22/21  7:27 AM  Result Value Ref Range   Sodium 138 135 - 145 mmol/L   Potassium 4.5 3.5 - 5.1 mmol/L   Chloride 110 98 - 111 mmol/L   CO2 19 (L) 22 - 32 mmol/L   Glucose, Bld 220 (H) 70 - 99 mg/dL    Comment: Glucose reference range applies only to samples taken after fasting for at least 8 hours.   BUN 26 (H) 8 - 23 mg/dL   Creatinine, Ser 1.37 (H) 0.44 - 1.00 mg/dL   Calcium 8.4 (L) 8.9 - 10.3 mg/dL   GFR, Estimated 42 (L) >60 mL/min    Comment: (NOTE) Calculated using the CKD-EPI Creatinine Equation (2021)    Anion gap 9 5 - 15    Comment:  Performed at [facility], [address] [place], [place] [address]  Glucose, capillary     Status: Abnormal   Collection Time: 04/22/21  8:12 AM  Result Value Ref Range   Glucose-Capillary 199 (H) 70 - 99 mg/dL    Comment: Glucose reference range applies only to samples taken after fasting for at least 8 hours.  Glucose, capillary     Status: Abnormal   Collection Time: 04/22/21 11:58 AM  Result Value Ref Range   Glucose-Capillary 180 (H) 70 - 99 mg/dL    Comment: Glucose reference range applies only to samples taken after fasting for at least 8 hours.  Glucose, capillary     Status: Abnormal   Collection Time: 04/22/21  5:10 PM  Result Value Ref Range   Glucose-Capillary 185 (H) 70 - 99 mg/dL    Comment: Glucose reference range applies only to samples taken after fasting for at least 8 hours.  Glucose, capillary     Status: Abnormal   Collection Time: 04/22/21  9:22 PM  Result Value Ref Range   Glucose-Capillary 174 (H) 70 - 99 mg/dL    Comment: Glucose reference range applies only to samples taken after fasting for at least 8 hours.  CBC with Differential/Platelet     Status: Abnormal   Collection Time: 04/23/21  5:32 AM  Result Value Ref Range   WBC 13.2 (H) 4.0 - 10.5 K/uL   RBC 3.59 (L) 3.87 - 5.11 MIL/uL   Hemoglobin 11.4 (L) 12.0 - 15.0 g/dL   HCT 33.7 (L) 36.0 - 46.0 %   MCV 93.9 80.0 - 100.0 fL   MCH 31.8 26.0 - 34.0 pg   MCHC 33.8 30.0 - 36.0 g/dL   RDW 18.3 (H)  11.5 - 15.5 %   Platelets 331 150 - 400 K/uL   nRBC 0.0 0.0 - 0.2 %   Neutrophils Relative % 77 %   Neutro Abs 10.2 (H) 1.7 - 7.7 K/uL   Lymphocytes Relative 7 %   Lymphs Abs 0.9 0.7 - 4.0 K/uL   Monocytes Relative 8 %   Monocytes Absolute 1.1 (H) 0.1 - 1.0 K/uL   Eosinophils Relative 5 %   Eosinophils Absolute 0.6 (H) 0.0 - 0.5 K/uL   Basophils Relative 1 %   Basophils Absolute 0.1 0.0 - 0.1 K/uL   Immature Granulocytes 2 %   Abs Immature Granulocytes 0.31 (H) 0.00 - 0.07 K/uL    Comment:  Performed at [facility], [address] [place], [place] [address]  Glucose, capillary     Status: Abnormal   Collection Time: 04/23/21  7:14 AM  Result Value Ref Range   Glucose-Capillary 184 (H) 70 - 99 mg/dL    Comment: Glucose reference range applies only to samples taken after fasting for at least 8 hours.  Glucose, capillary     Status: Abnormal   Collection Time: 04/23/21 11:50 AM  Result Value Ref Range   Glucose-Capillary 172 (H) 70 - 99 mg/dL    Comment: Glucose reference range applies only to samples taken after fasting for at least 8 hours.   No results found.  Blood pressure (!) 146/48, pulse 72, temperature 98.4 F (36.9 C), temperature source Oral, resp. rate 18, height $RemoveBe'5\' 5"'psyEqANPl$  (1.651 m), weight 118.5 kg, SpO2 96 %.   Assessment Osteomyelitis right fifth metatarsal phalangeal joint, right distal hallux.  Concern for possible osteomyelitis left calcaneus. Dry gangrene distal aspects of both big toes Venous/lymphedema type wounds to the posterior aspects of both legs. Diabetes type 2 polyneuropathy PVD Failure to thrive  Plan -Patient seen and examined today. -Patient appears to be more understanding today of medical condition and as to why she is in the hospital.  Appears to be improving compared to previous mentation. -Wounds appear to be very stable at this time. -Discussed with patient the need to perform an angiogram to bilateral lower extremities to improve circulation as patient appears to have fairly significant peripheral vascular disease based on examination, gangrene present, and ABI showing reduced flow. -Appreciate vascular surgery recommendations.  Planning for angiogram at some point soon. -Discussed with patient that she likely needs surgical debridement as well as partial amputations of a few digits due to ulcerations and infection present.  Discussed though that patient has poor blood flow and performing procedure right now would be  detrimental as she would likely not heal any surgical procedure until blood flow is improved. -At this point patient will likely need wound debridements to the posterior aspects of both lower legs, wound debridement with bone culture of the left heel with likely wound VAC application, partial amputations of the right hallux and right fifth ray.  Discussed with patient that once again she is at high risk for limb loss. -Continue with dressing instructions as ordered. -Wound culture growing Proteus.  Appreciate medicine recommendations for antibiotic therapy. -Likely that patient will have to see the wound care center after discharge for chronic lower extremity wounds.  She has been treated for these in the past at the wound care center.  We will continue to follow-up with patient after discharge as well. -For now planning waiting to do any surgical procedure until vascular team has improved circulation.  Would like to have circulation improved to  bilateral lower extremities prior to foot/leg surgical intervention such that she could have 1 procedure to do as needed for both limbs as far as debridement/amputations.  We will await vascular surgery input prior to surgery but will likely have to be at some point next week.  For now we will follow-up with patient more peripherally until vascular is able to improve circulation.  Will await recommendations.  Caroline More, DPM 04/23/2021, 12:49 PM

## 2021-04-23 NOTE — Progress Notes (Signed)
PROGRESS NOTE    [name]  JAS:[id number] DOB: July 17, 1955 DOA: 04/16/2021 PCP: Juline Patch, MD    Brief Narrative:  unfortunate 65 year old female with extensive past medical history who was brought in via EMS for evaluation of bilateral lower extremity redness swelling and pain.  Apparently patient has been in her wheelchair at home unable to move for several days.  Lower extremity significant cellulitic changes.  On broad-spectrum antibiotics.  Also some question of fracture in the feet on the right lower extremity.    Podiatry consulted on admission, recommendations appreciated.  Concern regarding vascular supply lower extremities.  Podiatry apparently reached out to vascular surgery for consultation prior to any surgical intervention.  Awaiting vascular evaluation to assess lower extremity vasculopathy.   Patient remains hemodynamically stable.  Remains on broad-spectrum antibiotics, IV fluids, as needed pain control.  Appreciate WOC consult.  Appreciate vascular evaluation and recommendations.  ABI completed with results significant for severe bilateral peripheral arterial disease.  Pending podiatry follow-up.  10/26 angiogram cancelled. Concern if pt is competent to make decisions.psych consulted 10/27 psychiatry evaluated patient and found her to be competent in decision making.  Plan for angiogram possibly tomorrow. 10/28- claims having diarrhea, but more like soft stool . She believes its her "nerves". Declined angiogram today, vascular was notified.   Assessment & Plan:   Principal Problem:   Adjustment disorder with anxiety Active Problems:   Sepsis due to cellulitis ([place])   Pressure injury of skin  Severe bilateral lower extremity cellulitis, nonpurulent Sepsis secondary to above Sepsis criteria met with tachycardia and leukocytosis.  Presumed source bilateral lower extremity nonpurulent cellulitis Concern regarding vascular supply Podiatry and vascular  consulted MRSA screen negative 7/26 vancomycin and cefepime were discontinued.  Patient was started on Ancef for nonpurulent cellulitis Podiatry and vascular following 10/28 awaiting angiogram, but unfortunately patient continues to change her mind. We will keep her n.p.o. tonight in case she can have angio in a.m.   Acute kidney injury on chronic kidney disease stage IIIb ATN versus prerenal azotemia 10/28 improving  continue to hold telmisartan No lab today Will check tomorrow     Hyperkalemia Improved, monitor periodically  Hypothyroidism Continue Synthroid   Type 2 diabetes mellitus with peripheral neuropathy Oral medications on hold Sliding scale coverage Carb modified diet Continue Neurontin  Essential hypertension PTA clonidine and Toprol-XL  Home ARB on hold IV hydralazine as needed 10/26 mildly elevated.  Will add amlodipine 5 mg daily  Morbid obesity BMI 41.93 This complicates overall care and prognosis Nutrition consult  Poor self-care TOC consult DSS evaluation pending   DVT prophylaxis: SQ Lovenox Code Status: Full Family Communication: None at bedside Disposition Plan: Status is: Inpatient  Remains inpatient appropriate because: Sepsis secondary to bilateral lower extremity cellulitis.  Pending podiatry follow-up.  Angiogram possibly tomorrow      Level of care: Med-Surg  Consultants:  Podiatry Vascular surgery  Procedures:  None  Antimicrobials: Cefazolin, started 10/25   Subjective: Reports no shortness of breath, chest pain, or abdominal pain   Objective: Vitals:   04/22/21 2125 04/23/21 0413 04/23/21 0431 04/23/21 0713  BP: (!) 134/48 (!) 167/55 (!) 164/68 (!) 146/48  Pulse: (!) 52 70 67 72  Resp:  17  18  Temp:  98.3 F (36.8 C)  98.4 F (36.9 C)  TempSrc:  Oral  Oral  SpO2:  100%  96%  Weight:      Height:        Intake/Output Summary (Last  24 hours) at 04/23/2021 0825 Last data filed at 04/22/2021  2300 Gross per 24 hour  Intake 240 ml  Output 500 ml  Net -260 ml   Filed Weights   04/16/21 2244 04/17/21 0403  Weight: 122.5 kg 118.5 kg    Examination: NAD, calm CTA no wheeze Regular S1-S2 no gallops +edema aaoxox3   Data Reviewed: I have personally reviewed following labs and imaging studies  CBC: Recent Labs  Lab 04/19/21 0618 04/20/21 0605 04/21/21 0536 04/22/21 0727 04/23/21 0532  WBC 10.3 10.6* 10.8* 14.7* 13.2*  NEUTROABS 7.9* 7.9* 8.1* 10.9* 10.2*  HGB 9.5* 10.8* 9.8* 10.8* 11.4*  HCT 30.6* 34.1* 29.4* 33.8* 33.7*  MCV 95.6 94.2 94.5 93.6 93.9  PLT 287 347 310 329 756   Basic Metabolic Panel: Recent Labs  Lab 04/17/21 0019 04/17/21 0436 04/18/21 0525 04/19/21 0618 04/20/21 0605 04/21/21 0536 04/22/21 0727  NA 141   < > 140 141 141 136 138  K 5.6*   < > 4.1 4.4 4.6 4.4 4.5  CL 114*   < > 118* 115* 115* 113* 110  CO2 14*   < > 17* 19* 19* 18* 19*  GLUCOSE 120*   < > 192* 206* 185* 267* 220*  BUN 60*   < > 47* 41* 34* 30* 26*  CREATININE 2.13*   < > 1.37* 1.57* 1.34* 1.51* 1.37*  CALCIUM 9.0   < > 8.0* 8.3* 8.6* 7.9* 8.4*  MG 2.4  --   --   --   --   --   --    < > = values in this interval not displayed.   GFR: Estimated Creatinine Clearance: 51.3 mL/min (A) (by C-G formula based on SCr of 1.37 mg/dL (H)). Liver Function Tests: Recent Labs  Lab 04/17/21 0019  AST 25  ALT 15  ALKPHOS 74  BILITOT 0.9  PROT 8.6*  ALBUMIN 3.2*   No results for input(s): LIPASE, AMYLASE in the last 168 hours. No results for input(s): AMMONIA in the last 168 hours. Coagulation Profile: Recent Labs  Lab 04/17/21 0019 04/17/21 0436  INR 1.3* 1.4*   Cardiac Enzymes: Recent Labs  Lab 04/17/21 0019  CKTOTAL 156   BNP (last 3 results) No results for input(s): PROBNP in the last 8760 hours. HbA1C: No results for input(s): HGBA1C in the last 72 hours.  CBG: Recent Labs  Lab 04/21/21 2106 04/22/21 0812 04/22/21 1158 04/22/21 1710 04/22/21 2122   GLUCAP 275* 199* 180* 185* 174*   Lipid Profile: No results for input(s): CHOL, HDL, LDLCALC, TRIG, CHOLHDL, LDLDIRECT in the last 72 hours. Thyroid Function Tests: No results for input(s): TSH, T4TOTAL, FREET4, T3FREE, THYROIDAB in the last 72 hours. Anemia Panel: No results for input(s): VITAMINB12, FOLATE, FERRITIN, TIBC, IRON, RETICCTPCT in the last 72 hours. Sepsis Labs: Recent Labs  Lab 04/17/21 0019 04/17/21 0436  PROCALCITON  --  0.73  LATICACIDVEN 2.0*  --     Recent Results (from the past 240 hour(s))  Blood culture (routine x 2)     Status: None   Collection Time: 04/17/21 12:19 AM   Specimen: BLOOD  Result Value Ref Range Status   Specimen Description BLOOD RHAND  Final   Special Requests   Final    BOTTLES DRAWN AEROBIC AND ANAEROBIC Blood Culture adequate volume   Culture   Final    NO GROWTH 5 DAYS Performed at [facility], [address], [place], [place] [address]    Report Status 04/22/2021  FINAL  Final  Resp Panel by RT-PCR (Flu A&B, Covid) Nasopharyngeal Swab     Status: None   Collection Time: 04/17/21  1:34 AM   Specimen: Nasopharyngeal Swab; Nasopharyngeal(NP) swabs in vial transport medium  Result Value Ref Range Status   SARS Coronavirus 2 by RT PCR NEGATIVE NEGATIVE Final    Comment: (NOTE) SARS-CoV-2 target nucleic acids are NOT DETECTED.  The SARS-CoV-2 RNA is generally detectable in upper respiratory specimens during the acute phase of infection. The lowest concentration of SARS-CoV-2 viral copies this assay can detect is 138 copies/mL. A negative result does not preclude SARS-Cov-2 infection and should not be used as the sole basis for treatment or other patient management decisions. A negative result may occur with  improper specimen collection/handling, submission of specimen other than nasopharyngeal swab, presence of viral mutation(s) within the areas targeted by this assay, and inadequate number of viral copies(<138  copies/mL). A negative result must be combined with clinical observations, patient history, and epidemiological information. The expected result is Negative.  Fact Sheet for Patients:  EntrepreneurPulse.com.au  Fact Sheet for Healthcare Providers:  IncredibleEmployment.be  This test is no t yet approved or cleared by the [place] FDA and  has been authorized for detection and/or diagnosis of SARS-CoV-2 by FDA under an Emergency Use Authorization (EUA). This EUA will remain  in effect (meaning this test can be used) for the duration of the COVID-19 declaration under Section 564(b)(1) of the Act, 21 U.S.C.section 360bbb-3(b)(1), unless the authorization is terminated  or revoked sooner.       Influenza A by PCR NEGATIVE NEGATIVE Final   Influenza B by PCR NEGATIVE NEGATIVE Final    Comment: (NOTE) The Xpert Xpress SARS-CoV-2/FLU/RSV plus assay is intended as an aid in the diagnosis of influenza from Nasopharyngeal swab specimens and should not be used as a sole basis for treatment. Nasal washings and aspirates are unacceptable for Xpert Xpress SARS-CoV-2/FLU/RSV testing.  Fact Sheet for Patients: EntrepreneurPulse.com.au  Fact Sheet for Healthcare Providers: IncredibleEmployment.be  This test is not yet approved or cleared by the [place] FDA and has been authorized for detection and/or diagnosis of SARS-CoV-2 by FDA under an Emergency Use Authorization (EUA). This EUA will remain in effect (meaning this test can be used) for the duration of the COVID-19 declaration under Section 564(b)(1) of the Act, 21 U.S.C. section 360bbb-3(b)(1), unless the authorization is terminated or revoked.  Performed at [facility], [address] [place], [place] [address]   Blood culture (routine x 2)     Status: None   Collection Time: 04/17/21  2:25 AM   Specimen: BLOOD  Result Value Ref Range  Status   Specimen Description BLOOD BLOOD LEFT HAND  Final   Special Requests   Final    BOTTLES DRAWN AEROBIC AND ANAEROBIC Blood Culture adequate volume   Culture   Final    NO GROWTH 5 DAYS Performed at [facility], [address] [place], [place] [address]    Report Status 04/22/2021 FINAL  Final  Surgical pcr screen     Status: None   Collection Time: 04/19/21  1:09 PM   Specimen: Nasal Mucosa; Nasal Swab  Result Value Ref Range Status   MRSA, PCR NEGATIVE NEGATIVE Final   Staphylococcus aureus NEGATIVE NEGATIVE Final    Comment: (NOTE) The Xpert SA Assay (FDA approved for NASAL specimens in patients 21 years of age and older), is one component of a comprehensive surveillance program. It is not  intended to diagnose infection nor to guide or monitor treatment. Performed at [facility], [address] [place], [place] [address]   Aerobic/Anaerobic Culture w Gram Stain (surgical/deep wound)     Status: None (Preliminary result)   Collection Time: 04/21/21  6:20 PM   Specimen: Wound  Result Value Ref Range Status   Specimen Description   Final    WOUND Performed at [facility], [address], [place], [place] [address]    Special Requests   Final    NONE Performed at [facility], [address] [place], [place] [address]    Gram Stain NO WBC SEEN NO ORGANISMS SEEN   Final   Culture   Final    TOO YOUNG TO READ Performed at [facility], [place] [address], [place], [place] [address]    Report Status PENDING  Incomplete         Radiology Studies: No results found.      Scheduled Meds:  amLODipine  5 mg Oral Daily   atorvastatin  20 mg Oral Daily   cloNIDine  0.3 mg Oral TID   enoxaparin (LOVENOX) injection  0.5 mg/kg Subcutaneous Q24H   gabapentin  300 mg Oral BID   hydrocerin   Topical Daily   insulin aspart  0-15 Units Subcutaneous TID WC   insulin aspart  0-5 Units Subcutaneous QHS   levothyroxine   112 mcg Oral Q0600   metoprolol succinate  50 mg Oral Q breakfast   Continuous Infusions:  sodium chloride 10 mL/hr at 04/23/21 0601   sodium chloride 75 mL/hr at 04/23/21 0601    ceFAZolin (ANCEF) IV 1 g (04/23/21 0602)     LOS: 6 days    Time spent: 35 minutes with more than 50% on [place], MD [facility]   If 7PM-7AM, please contact night-coverage  04/23/2021, 8:25 AM

## 2021-04-23 NOTE — Progress Notes (Signed)
Occupational Therapy Treatment Patient Details Name: [name] MRN: [id number] DOB: Oct 23, 1955 Today's Date: 04/23/2021   History of present illness 65 y.o. female with a past medical history of DM, HTN, hypothyroidism, kidney stones, lymphedema, morbid obesity and plantar fasciitis followed by podiatry outpatient for some chronic wounds and edema in the lower extremity who presents via EMS after a welfare check was made on the patient with neighbors concerned that patient has been stuck in her recliner for several days.  Patient states her feet have been hurting her for over a month got particularly bad over the last couple of days preventing her from standing or walking.   OT comments  Pt seen for OT/PT co-tx to address bed mobility and ADL transfer training. RN provided post-op shoe for R foot near start of session. Pt very talkative, requiring frequent redirection to task and encouragement to initiate. Pt required  MOD A x2 for rolling to her side so RN could place a sacral foam pad. Mod A x2 for supine>sit EOB and was able to maintain static sitting balance with supervision and set up for grooming tasks. OT provided MAX A for combing out severe tangles/matting in her hair while she sat EOB and participated in there-ex with PT. Pt demonstrates decreased awareness of her deficits and expressed surprise that she was not ultimately able to achieve standing despite OT and PT support. She required MAX-TOTAL A but was unable to clear buttocks from EOB and required MAX VC for technique. MAX-TOTAL A x2 to return to bed. RN notified of pt's RLE bandage/wrapping requiring changed. Pt continues to benefit from skilled OT services, as she is far from baseline.    Recommendations for follow up therapy are one component of a multi-disciplinary discharge planning process, led by the attending physician.  Recommendations may be updated based on patient status, additional functional criteria and insurance  authorization.    Follow Up Recommendations  Skilled nursing-short term rehab (<3 hours/day)    Assistance Recommended at Discharge Intermittent Supervision/Assistance  Equipment Recommendations  BSC (BARI DROP ARM)    Recommendations for Other Services      Precautions / Restrictions Precautions Precautions: Fall Restrictions Weight Bearing Restrictions: No Other Position/Activity Restrictions: Per podiatry (via secure chat), pt likely has "osteomyelitis to her right 5th toe joint that will have to be addressed in the future but for now she should be  able to transfer on her foot and very short distances.  She also has a necrotic heel ulcer on her left foot. Should also minimize ambulation on this side."; now with post-op shoe for R foot       Mobility Bed Mobility Overal bed mobility: Needs Assistance Bed Mobility: Supine to Sit;Sit to Supine;Rolling Rolling: +2 for physical assistance;Mod assist   Supine to sit: Mod assist;+2 for physical assistance;HOB elevated Sit to supine: Total assist;Max assist;+2 for physical assistance        Transfers Overall transfer level: Needs assistance Equipment used: Rolling walker (2 wheels) Transfers: Sit to/from Stand Sit to Stand: Max assist;+2 physical assistance;Total assist           General transfer comment: despite attempts, unable to clear buttocks off bed     Balance Overall balance assessment: Needs assistance Sitting-balance support: No upper extremity supported;Feet unsupported;Feet supported Sitting balance-Leahy Scale: Fair       Standing balance-Leahy Scale: Zero  ADL either performed or assessed with clinical judgement   ADL Overall ADL's : Needs assistance/impaired     Grooming: Sitting;Set up;Supervision/safety;Wash/dry face;Wash/dry hands Grooming Details (indicate cue type and reason): set up and supervision for grooming; OT provided MAX A for combing out severe  tangled hair while pt participated in seated BLE ex with PT                                     Vision       Perception     Praxis      Cognition Arousal/Alertness: Awake/alert Behavior During Therapy: WFL for tasks assessed/performed Overall Cognitive Status: No family/caregiver present to determine baseline cognitive functioning                                 General Comments: decreased awareness of deficits, near constant redirection, appears to use talking/complaints to delay initiation of tasks          Exercises     Shoulder Instructions       General Comments      Pertinent Vitals/ Pain       Pain Assessment: Faces Faces Pain Scale: Hurts even more Pain Location: back when lying flat temporarily to support repositioning Pain Descriptors / Indicators: Aching;Grimacing Pain Intervention(s): Limited activity within patient's tolerance;Monitored during session;Repositioned  Home Living                                          Prior Functioning/Environment              Frequency  Min 1X/week        Progress Toward Goals  OT Goals(current goals can now be found in the care plan section)  Progress towards OT goals: Progressing toward goals  Acute Rehab OT Goals Patient Stated Goal: get stronger and stand again OT Goal Formulation: With patient Time For Goal Achievement: 05/04/21 Potential to Achieve Goals: [place] Discharge plan remains appropriate;Frequency remains appropriate    Co-evaluation    PT/OT/SLP Co-Evaluation/Treatment: Yes Reason for Co-Treatment: Complexity of the patient's impairments (multi-system involvement);For patient/therapist safety;To address functional/ADL transfers PT goals addressed during session: Mobility/safety with mobility OT goals addressed during session: ADL's and self-care      AM-PAC OT "6 Clicks" Daily Activity     Outcome Measure   Help from another  person eating meals?: None Help from another person taking care of personal grooming?: A Little Help from another person toileting, which includes using toliet, bedpan, or urinal?: Total Help from another person bathing (including washing, rinsing, drying)?: A Lot Help from another person to put on and taking off regular upper body clothing?: A Little Help from another person to put on and taking off regular lower body clothing?: A Lot 6 Click Score: 15    End of Session    OT Visit Diagnosis: Unsteadiness on feet (R26.81);Muscle weakness (generalized) (M62.81);Pain Pain - Right/Left: Right Pain - part of body: Leg   Activity Tolerance Patient tolerated treatment well   Patient Left in bed;with call bell/phone within reach;with bed alarm set   Nurse Communication Mobility status;Other (comment) (wounds wrapped needing changed)        Time: 6712-4580 OT Time Calculation (min): 41 min  Charges: OT General Charges $OT Visit: 1 Visit OT Treatments $Self Care/Home Management : 23-37 mins  Ardeth Perfect., MPH, MS, OTR/L ascom [phone] 04/23/21, 5:33 PM

## 2021-04-23 NOTE — TOC Progression Note (Addendum)
Transition of Care [facility]) - Progression Note    Patient Details  Name: [name] MRN: [id number] Date of Birth: 12/24/1955  Transition of Care [facility]) CM/SW [place], LCSW Phone Number: 04/23/2021, 9:20 AM  Clinical Narrative:   Updated APS social worker, Milus Glazier. She will try to come to hospital later today to discuss SNF placement with patient.  9:48 am: [facility] admissions coordinator is still reviewing referral. [facility] admissions coordinator has offered a bed.   1:28 pm: [facility] bed. Patient is aware and has accepted bed offer. No anticipated discharge date yet. Plan for angiogram on Monday. Told admissions coordinator she is on an air loss mattress. Updated APS Education officer, museum.  Expected Discharge Plan: [place] Barriers to Discharge: Continued Medical Work up  Expected Discharge Plan and Services Expected Discharge Plan: [place] arrangements for the past 2 months: Single Family Home                                       Social Determinants of Health (SDOH) Interventions    Readmission Risk Interventions No flowsheet data found.

## 2021-04-24 DIAGNOSIS — F4322 Adjustment disorder with anxiety: Secondary | ICD-10-CM | POA: Diagnosis not present

## 2021-04-24 LAB — CBC WITH DIFFERENTIAL/PLATELET
Abs Immature Granulocytes: 0.2 10*3/uL — ABNORMAL HIGH (ref 0.00–0.07)
Basophils Absolute: 0.1 10*3/uL (ref 0.0–0.1)
Basophils Relative: 1 %
Eosinophils Absolute: 0.6 10*3/uL — ABNORMAL HIGH (ref 0.0–0.5)
Eosinophils Relative: 5 %
HCT: 31.9 % — ABNORMAL LOW (ref 36.0–46.0)
Hemoglobin: 10.3 g/dL — ABNORMAL LOW (ref 12.0–15.0)
Immature Granulocytes: 2 %
Lymphocytes Relative: 10 %
Lymphs Abs: 1.2 10*3/uL (ref 0.7–4.0)
MCH: 29.9 pg (ref 26.0–34.0)
MCHC: 32.3 g/dL (ref 30.0–36.0)
MCV: 92.7 fL (ref 80.0–100.0)
Monocytes Absolute: 1.1 10*3/uL — ABNORMAL HIGH (ref 0.1–1.0)
Monocytes Relative: 10 %
Neutro Abs: 8.5 10*3/uL — ABNORMAL HIGH (ref 1.7–7.7)
Neutrophils Relative %: 72 %
Platelets: 337 10*3/uL (ref 150–400)
RBC: 3.44 MIL/uL — ABNORMAL LOW (ref 3.87–5.11)
RDW: 18.3 % — ABNORMAL HIGH (ref 11.5–15.5)
WBC: 11.6 10*3/uL — ABNORMAL HIGH (ref 4.0–10.5)
nRBC: 0 % (ref 0.0–0.2)

## 2021-04-24 LAB — GLUCOSE, CAPILLARY
Glucose-Capillary: 198 mg/dL — ABNORMAL HIGH (ref 70–99)
Glucose-Capillary: 196 mg/dL — ABNORMAL HIGH (ref 70–99)
Glucose-Capillary: 254 mg/dL — ABNORMAL HIGH (ref 70–99)
Glucose-Capillary: 147 mg/dL — ABNORMAL HIGH (ref 70–99)

## 2021-04-24 MED ORDER — HYDROXYZINE HCL 10 MG PO TABS
10.0000 mg | ORAL_TABLET | Freq: Four times a day (QID) | ORAL | Status: DC | PRN
  Administered 2021-04-24 – 2021-05-16 (×17): 10 mg via ORAL
  Filled 2021-04-24 (×4): qty 1
  Filled 2021-04-24: qty 3
  Filled 2021-04-24 (×2): qty 1
  Filled 2021-04-24: qty 3
  Filled 2021-04-24 (×10): qty 1
  Filled 2021-04-24: qty 3
  Filled 2021-04-24 (×6): qty 1

## 2021-04-24 MED ORDER — SODIUM CHLORIDE 0.9 % IV SOLN
2.0000 g | Freq: Two times a day (BID) | INTRAVENOUS | Status: DC
  Administered 2021-04-24 – 2021-05-01 (×14): 2 g via INTRAVENOUS
  Filled 2021-04-24 (×15): qty 2

## 2021-04-24 NOTE — Progress Notes (Signed)
Physical Therapy Treatment Patient Details Name: [name] MRN: [id number] DOB: [birth year] Today's Date: 04/24/2021   History of Present Illness      PT Comments    Pt seen for OT/PT co-tx to address bed mobility and ADL transfer training. RN provided post-op shoe for R foot near start of session.  Pt required maximal encouragement throughout session in order for pt to perform tasks assigned by therapists.  Pt requires modA x2 for rolling to side in bed for RN to replace foam pad on sacrum.  Pt required modA x54fr transfer to EOB and maxA x2 for STS.  Pt did complete some therapeutic exercises during seated positioning, however has minimal contraction in legs and is unable to follow directions very well due to talkative nature.  Pt required maxA x2 in order to return to supine positioning where all needs were met.  Current discharge plans to SNF remain appropriate at this time.  Pt will continue to benefit from skilled therapy in order to address deficits listed below.    Recommendations for follow up therapy are one component of a multi-disciplinary discharge planning process, led by the attending physician.  Recommendations may be updated based on patient status, additional functional criteria and insurance authorization.  Follow Up Recommendations        Assistance Recommended at Discharge    Equipment Recommendations       Recommendations for Other Services       Precautions / Restrictions       Mobility  Bed Mobility                    Transfers                        Ambulation/Gait                 Stairs             Wheelchair Mobility    Modified Rankin (Stroke Patients Only)       Balance                                            Cognition                                                Exercises      General Comments        Pertinent Vitals/Pain      Home Living                           Prior Function            PT Goals (current goals can now be found in the care plan section) Acute Rehab PT Goals Patient Stated Goal: to go home PT Goal Formulation: With patient Time For Goal Achievement: 05/04/21 Potential to Achieve Goals: Poor Progress towards PT goals: Progressing toward goals    Frequency           PT Plan      Co-evaluation              AM-PAC PT "6 Clicks" Mobility   Outcome Measure  End of Session               Time: 4039-7953 PT Time Calculation (min) (ACUTE ONLY): 45 min  Charges:  $Therapeutic Activity: 8-22 mins                     Gwenlyn Saran, PT, DPT 04/23/21, 5:30PM    Christie Nottingham 04/23/21, 5:30PM

## 2021-04-24 NOTE — Progress Notes (Signed)
PROGRESS NOTE    [name]  DXA:[id number] DOB: 1956/02/08 DOA: 04/16/2021 PCP: Juline Patch, MD    Brief Narrative:  unfortunate 65 year old female with extensive past medical history who was brought in via EMS for evaluation of bilateral lower extremity redness swelling and pain.  Apparently patient has been in her wheelchair at home unable to move for several days.  Lower extremity significant cellulitic changes.  On broad-spectrum antibiotics.  Also some question of fracture in the feet on the right lower extremity.    Podiatry consulted on admission, recommendations appreciated.  Concern regarding vascular supply lower extremities.  Podiatry apparently reached out to vascular surgery for consultation prior to any surgical intervention.  Awaiting vascular evaluation to assess lower extremity vasculopathy.   Patient remains hemodynamically stable.  Remains on broad-spectrum antibiotics, IV fluids, as needed pain control.  Appreciate WOC consult.  Appreciate vascular evaluation and recommendations.  ABI completed with results significant for severe bilateral peripheral arterial disease.  Pending podiatry follow-up.  10/26 angiogram cancelled. Concern if pt is competent to make decisions.psych consulted 10/27 psychiatry evaluated patient and found her to be competent in decision making.  Plan for angiogram possibly tomorrow. 10/28- claims having diarrhea, but more like soft stool . She believes its her "nerves". Declined angiogram today, vascular was notified.  10/29 "diarrhea resolved, states was likely her "nerves" causing it. Agreeable to angiogram on Monday. Told her will be npo on Sunday night and to please comply.  Assessment & Plan:   Principal Problem:   Adjustment disorder with anxiety Active Problems:   Sepsis due to cellulitis ([place])   Pressure injury of skin  Severe bilateral lower extremity cellulitis, nonpurulent Sepsis secondary to above Sepsis criteria met with  tachycardia and leukocytosis.  Presumed source bilateral lower extremity nonpurulent cellulitis Concern regarding vascular supply Podiatry and vascular consulted MRSA screen negative 7/26 vancomycin and cefepime were discontinued.  Patient was started on Ancef for nonpurulent cellulitis Podiatry and vascular following 10/29 agreeable to angiogram, will n.p.o. her Sunday night for angiogram to be done on Monday  Continue IV antibiotics    Acute kidney injury on chronic kidney disease stage IIIb ATN versus prerenal azotemia 10/29 improved with IV fluids Continue to hold telmisartan Check a.m. labs      Hyperkalemia Improved    Hypothyroidism Continue Synthroid   Type 2 diabetes mellitus with peripheral neuropathy Oral medications on hold BG overall stable Continue RISS Continue Neurontin  Essential hypertension Better controlled Continue Toprol, clonidine, amlodipine ARB on hold as above IV hydralazine   Morbid obesity BMI 20.94 This complicates overall care and prognosis Nutrition consult  Poor self-care TOC consult DSS evaluation pending   DVT prophylaxis: SQ Lovenox Code Status: Full Family Communication: None at bedside Disposition Plan: Status is: Inpatient  Remains inpatient appropriate because: Sepsis secondary to bilateral lower extremity cellulitis.  Receiving IV treatment Angiogram on Monday     Level of care: Med-Surg  Consultants:  Podiatry Vascular surgery  Procedures:  None  Antimicrobials: Cefazolin, started 10/25   Subjective: No shortness of breath, abdominal pain, diarrhea   Objective: Vitals:   04/23/21 1611 04/23/21 2244 04/24/21 0416 04/24/21 0710  BP: (!) 155/48 (!) 135/45 (!) 143/54 (!) 151/47  Pulse: 62 (!) 59 (!) 55 (!) 58  Resp: $Remo'18 16 20 18  'IzKgr$ Temp: 98.9 F (37.2 C) 98.2 F (36.8 C) 97.8 F (36.6 C) 98.6 F (37 C)  TempSrc:  Oral Oral   SpO2: 100% 97% 98% 100%  Weight:  Height:         Intake/Output Summary (Last 24 hours) at 04/24/2021 0820 Last data filed at 04/23/2021 1735 Gross per 24 hour  Intake 605.33 ml  Output 200 ml  Net 405.33 ml   Filed Weights   04/16/21 2244 04/17/21 0403  Weight: 122.5 kg 118.5 kg    Examination: NAD, calm CTA no wheeze RRR or S1-S2 no gallops Obese benign positive bowel sounds Lower extremity wrapped in dressing positive edema Alert oriented x3 Mood and affect appropriate in current setting  Data Reviewed: I have personally reviewed following labs and imaging studies  CBC: Recent Labs  Lab 04/20/21 0605 04/21/21 0536 04/22/21 0727 04/23/21 0532 04/24/21 0430  WBC 10.6* 10.8* 14.7* 13.2* 11.6*  NEUTROABS 7.9* 8.1* 10.9* 10.2* 8.5*  HGB 10.8* 9.8* 10.8* 11.4* 10.3*  HCT 34.1* 29.4* 33.8* 33.7* 31.9*  MCV 94.2 94.5 93.6 93.9 92.7  PLT 347 310 329 331 103   Basic Metabolic Panel: Recent Labs  Lab 04/18/21 0525 04/19/21 0618 04/20/21 0605 04/21/21 0536 04/22/21 0727  NA 140 141 141 136 138  K 4.1 4.4 4.6 4.4 4.5  CL 118* 115* 115* 113* 110  CO2 17* 19* 19* 18* 19*  GLUCOSE 192* 206* 185* 267* 220*  BUN 47* 41* 34* 30* 26*  CREATININE 1.37* 1.57* 1.34* 1.51* 1.37*  CALCIUM 8.0* 8.3* 8.6* 7.9* 8.4*   GFR: Estimated Creatinine Clearance: 51.3 mL/min (A) (by C-G formula based on SCr of 1.37 mg/dL (H)). Liver Function Tests: No results for input(s): AST, ALT, ALKPHOS, BILITOT, PROT, ALBUMIN in the last 168 hours.  No results for input(s): LIPASE, AMYLASE in the last 168 hours. No results for input(s): AMMONIA in the last 168 hours. Coagulation Profile: No results for input(s): INR, PROTIME in the last 168 hours.  Cardiac Enzymes: No results for input(s): CKTOTAL, CKMB, CKMBINDEX, TROPONINI in the last 168 hours.  BNP (last 3 results) No results for input(s): PROBNP in the last 8760 hours. HbA1C: No results for input(s): HGBA1C in the last 72 hours.  CBG: Recent Labs  Lab 04/23/21 0714  04/23/21 1150 04/23/21 1658 04/23/21 2034 04/24/21 0711  GLUCAP 184* 172* 168* 244* 198*   Lipid Profile: No results for input(s): CHOL, HDL, LDLCALC, TRIG, CHOLHDL, LDLDIRECT in the last 72 hours. Thyroid Function Tests: No results for input(s): TSH, T4TOTAL, FREET4, T3FREE, THYROIDAB in the last 72 hours. Anemia Panel: No results for input(s): VITAMINB12, FOLATE, FERRITIN, TIBC, IRON, RETICCTPCT in the last 72 hours. Sepsis Labs: No results for input(s): PROCALCITON, LATICACIDVEN in the last 168 hours.   Recent Results (from the past 240 hour(s))  Blood culture (routine x 2)     Status: None   Collection Time: 04/17/21 12:19 AM   Specimen: BLOOD  Result Value Ref Range Status   Specimen Description BLOOD RHAND  Final   Special Requests   Final    BOTTLES DRAWN AEROBIC AND ANAEROBIC Blood Culture adequate volume   Culture   Final    NO GROWTH 5 DAYS Performed at [facility], [address] [place], [place] [address]    Report Status 04/22/2021 FINAL  Final  Resp Panel by RT-PCR (Flu A&B, Covid) Nasopharyngeal Swab     Status: None   Collection Time: 04/17/21  1:34 AM   Specimen: Nasopharyngeal Swab; Nasopharyngeal(NP) swabs in vial transport medium  Result Value Ref Range Status   SARS Coronavirus 2 by RT PCR NEGATIVE NEGATIVE Final    Comment: (NOTE) SARS-CoV-2 target nucleic acids  are NOT DETECTED.  The SARS-CoV-2 RNA is generally detectable in upper respiratory specimens during the acute phase of infection. The lowest concentration of SARS-CoV-2 viral copies this assay can detect is 138 copies/mL. A negative result does not preclude SARS-Cov-2 infection and should not be used as the sole basis for treatment or other patient management decisions. A negative result may occur with  improper specimen collection/handling, submission of specimen other than nasopharyngeal swab, presence of viral mutation(s) within the areas targeted by this assay, and inadequate  number of viral copies(<138 copies/mL). A negative result must be combined with clinical observations, patient history, and epidemiological information. The expected result is Negative.  Fact Sheet for Patients:  EntrepreneurPulse.com.au  Fact Sheet for Healthcare Providers:  IncredibleEmployment.be  This test is no t yet approved or cleared by the [place] FDA and  has been authorized for detection and/or diagnosis of SARS-CoV-2 by FDA under an Emergency Use Authorization (EUA). This EUA will remain  in effect (meaning this test can be used) for the duration of the COVID-19 declaration under Section 564(b)(1) of the Act, 21 U.S.C.section 360bbb-3(b)(1), unless the authorization is terminated  or revoked sooner.       Influenza A by PCR NEGATIVE NEGATIVE Final   Influenza B by PCR NEGATIVE NEGATIVE Final    Comment: (NOTE) The Xpert Xpress SARS-CoV-2/FLU/RSV plus assay is intended as an aid in the diagnosis of influenza from Nasopharyngeal swab specimens and should not be used as a sole basis for treatment. Nasal washings and aspirates are unacceptable for Xpert Xpress SARS-CoV-2/FLU/RSV testing.  Fact Sheet for Patients: EntrepreneurPulse.com.au  Fact Sheet for Healthcare Providers: IncredibleEmployment.be  This test is not yet approved or cleared by the [place] FDA and has been authorized for detection and/or diagnosis of SARS-CoV-2 by FDA under an Emergency Use Authorization (EUA). This EUA will remain in effect (meaning this test can be used) for the duration of the COVID-19 declaration under Section 564(b)(1) of the Act, 21 U.S.C. section 360bbb-3(b)(1), unless the authorization is terminated or revoked.  Performed at [facility], [address] [place], [place] [address]   Blood culture (routine x 2)     Status: None   Collection Time: 04/17/21  2:25 AM   Specimen: BLOOD   Result Value Ref Range Status   Specimen Description BLOOD BLOOD LEFT HAND  Final   Special Requests   Final    BOTTLES DRAWN AEROBIC AND ANAEROBIC Blood Culture adequate volume   Culture   Final    NO GROWTH 5 DAYS Performed at [facility], [address], [place], [place] [address]    Report Status 04/22/2021 FINAL  Final  Surgical pcr screen     Status: None   Collection Time: 04/19/21  1:09 PM   Specimen: Nasal Mucosa; Nasal Swab  Result Value Ref Range Status   MRSA, PCR NEGATIVE NEGATIVE Final   Staphylococcus aureus NEGATIVE NEGATIVE Final    Comment: (NOTE) The Xpert SA Assay (FDA approved for NASAL specimens in patients 69 years of age and older), is one component of a comprehensive surveillance program. It is not intended to diagnose infection nor to guide or monitor treatment. Performed at [facility], [address] [place], [place] [address]   Aerobic/Anaerobic Culture w Gram Stain (surgical/deep wound)     Status: None (Preliminary result)   Collection Time: 04/21/21  6:20 PM   Specimen: Wound  Result Value Ref Range Status   Specimen Description   Final  WOUND Performed at [facility], [address] [place], [place] [address]    Special Requests   Final    NONE Performed at [facility], [address] [place], [place] [address]    Gram Stain   Final    NO WBC SEEN NO ORGANISMS SEEN Performed at [facility], [place] [address], [place], [place] [address]    Culture ABUNDANT PROTEUS VULGARIS  Final   Report Status PENDING  Incomplete         Radiology Studies: No results found.      Scheduled Meds:  acidophilus  2 capsule Oral Daily   amLODipine  5 mg Oral Daily   atorvastatin  20 mg Oral Daily   cloNIDine  0.3 mg Oral TID   enoxaparin (LOVENOX) injection  0.5 mg/kg Subcutaneous Q24H   gabapentin  300 mg Oral BID   hydrocerin   Topical Daily   insulin aspart  0-15 Units Subcutaneous TID  WC   insulin aspart  0-5 Units Subcutaneous QHS   levothyroxine  112 mcg Oral Q0600   metoprolol succinate  50 mg Oral Q breakfast   Continuous Infusions:  sodium chloride Stopped (04/23/21 1727)    ceFAZolin (ANCEF) IV 1 g (04/24/21 0527)     LOS: 7 days    Time spent: 35 minutes with more than 50% on [place], MD [facility]   If 7PM-7AM, please contact night-coverage  04/24/2021, 8:20 AM

## 2021-04-25 DIAGNOSIS — F4322 Adjustment disorder with anxiety: Secondary | ICD-10-CM | POA: Diagnosis not present

## 2021-04-25 LAB — GLUCOSE, CAPILLARY
Glucose-Capillary: 174 mg/dL — ABNORMAL HIGH (ref 70–99)
Glucose-Capillary: 171 mg/dL — ABNORMAL HIGH (ref 70–99)
Glucose-Capillary: 192 mg/dL — ABNORMAL HIGH (ref 70–99)
Glucose-Capillary: 238 mg/dL — ABNORMAL HIGH (ref 70–99)
Glucose-Capillary: 246 mg/dL — ABNORMAL HIGH (ref 70–99)

## 2021-04-25 LAB — BASIC METABOLIC PANEL
Anion gap: 6 (ref 5–15)
BUN: 19 mg/dL (ref 8–23)
CO2: 19 mmol/L — ABNORMAL LOW (ref 22–32)
Calcium: 7.7 mg/dL — ABNORMAL LOW (ref 8.9–10.3)
Chloride: 112 mmol/L — ABNORMAL HIGH (ref 98–111)
Creatinine, Ser: 1.25 mg/dL — ABNORMAL HIGH (ref 0.44–1.00)
GFR, Estimated: 47 mL/min — ABNORMAL LOW (ref >60)
Glucose, Bld: 165 mg/dL — ABNORMAL HIGH (ref 70–99)
Potassium: 4 mmol/L (ref 3.5–5.1)
Sodium: 137 mmol/L (ref 135–145)

## 2021-04-25 NOTE — Plan of Care (Signed)
  Problem: Education: Goal: Knowledge of General Education information will improve Description: Including pain rating scale, medication(s)/side effects and non-pharmacologic comfort measures Outcome: Progressing   Problem: Health Behavior/Discharge Planning: Goal: Ability to manage health-related needs will improve Outcome: Progressing   Problem: Clinical Measurements: Goal: Ability to maintain clinical measurements within normal limits will improve Outcome: Progressing Goal: Will remain free from infection Outcome: Progressing Goal: Diagnostic test results will improve Outcome: Progressing Goal: Respiratory complications will improve Outcome: Progressing Goal: Cardiovascular complication will be avoided Outcome: Progressing   Problem: Activity: Goal: Risk for activity intolerance will decrease Outcome: Progressing   Problem: Coping: Goal: Level of anxiety will decrease Outcome: Progressing   Problem: Nutrition: Goal: Adequate nutrition will be maintained Outcome: Progressing   Problem: Elimination: Goal: Will not experience complications related to bowel motility Outcome: Progressing Goal: Will not experience complications related to urinary retention Outcome: Progressing   Problem: Safety: Goal: Ability to remain free from injury will improve Outcome: Progressing   Problem: Skin Integrity: Goal: Risk for impaired skin integrity will decrease Outcome: Progressing   Problem: Clinical Measurements: Goal: Ability to avoid or minimize complications of infection will improve Outcome: Progressing

## 2021-04-25 NOTE — Progress Notes (Signed)
PROGRESS NOTE    [name]  PFS:[id number] DOB: Dec 19, 1955 DOA: 04/16/2021 PCP: Duanne Limerick, MD    Brief Narrative:  unfortunate 65 year old female with extensive past medical history who was brought in via EMS for evaluation of bilateral lower extremity redness swelling and pain.  Apparently patient has been in her wheelchair at home unable to move for several days.  Lower extremity significant cellulitic changes.  On broad-spectrum antibiotics.  Also some question of fracture in the feet on the right lower extremity.    Podiatry consulted on admission, recommendations appreciated.  Concern regarding vascular supply lower extremities.  Podiatry apparently reached out to vascular surgery for consultation prior to any surgical intervention.  Awaiting vascular evaluation to assess lower extremity vasculopathy.   Patient remains hemodynamically stable.  Remains on broad-spectrum antibiotics, IV fluids, as needed pain control.  Appreciate WOC consult.  Appreciate vascular evaluation and recommendations.  ABI completed with results significant for severe bilateral peripheral arterial disease.  Pending podiatry follow-up.  10/26 angiogram cancelled. Concern if pt is competent to make decisions.psych consulted 10/27 psychiatry evaluated patient and found her to be competent in decision making.  Plan for angiogram possibly tomorrow. 10/28- claims having diarrhea, but more like soft stool . She believes its her "nerves". Declined angiogram today, vascular was notified.  10/29 "diarrhea resolved, states was likely her "nerves" causing it. Agreeable to angiogram on Monday. Told her will be npo on Sunday night and to please comply.  Assessment & Plan:   Principal Problem:   Adjustment disorder with anxiety Active Problems:   Sepsis due to cellulitis (HCC)   Pressure injury of skin  Severe bilateral lower extremity cellulitis, nonpurulent Sepsis secondary to above Sepsis criteria met with  tachycardia and leukocytosis.  Presumed source bilateral lower extremity nonpurulent cellulitis Concern regarding vascular supply Podiatry and vascular consulted MRSA screen negative 7/26 vancomycin and cefepime were discontinued.  Patient was started on Ancef for nonpurulent cellulitis Podiatry and vascular following 10/30-sensitivity to wound culture came back we will switch her antibiotics to cefepime as currently resistant to cefazolin. Plan for angiogram in a.m. by vascular surgery.  N.p.o. at midnight tonight    Acute kidney injury on chronic kidney disease stage IIIb ATN versus prerenal azotemia 10/29 continue holding telmisartan Creatinine improving with IV fluids and encourage p.o. intake.  Today is 1.25     Hyperkalemia Improved   Hypothyroidism Continue Synthroid   Type 2 diabetes mellitus with peripheral neuropathy Oral medications on hold BG overall stable Continue RISS Continue Neurontin  Essential hypertension Better controlled Continue Toprol, clonidine, amlodipine ARB on hold as above IV hydralazine   Morbid obesity BMI 43.47 This complicates overall care and prognosis Nutrition consult  Poor self-care TOC consult DSS evaluation pending   DVT prophylaxis: SQ Lovenox Code Status: Full Family Communication: None at bedside Disposition Plan: Status is: Inpatient  Remains inpatient appropriate because: Sepsis secondary to bilateral lower extremity cellulitis.  Receiving IV treatment Angiogram on Monday     Level of care: Med-Surg  Consultants:  Podiatry Vascular surgery  Procedures:  None  Antimicrobials: Cefazolin, started 10/25...>dc'd 10/29 Cefepime 10/29>>>    Subjective:    Objective: Vitals:   04/24/21 1507 04/24/21 2100 04/25/21 0322 04/25/21 0727  BP: (!) 133/51 136/61 (!) 144/58 (!) 156/58  Pulse: (!) 56 62 (!) 50 (!) 53  Resp: 18 18 15 18   Temp: 98.9 F (37.2 C) 98.7 F (37.1 C) 98.2 F (36.8 C) 98.6 F  (37 C)  TempSrc:  Oral Oral Oral  SpO2: 99% 98% 97% 98%  Weight:      Height:        Intake/Output Summary (Last 24 hours) at 04/25/2021 0821 Last data filed at 04/25/2021 0343 Gross per 24 hour  Intake --  Output 1000 ml  Net -1000 ml   Filed Weights   04/16/21 2244 04/17/21 0403  Weight: 122.5 kg 118.5 kg    Examination: Nad, calm Cta no w/r Rrr s1/s2 no gallop Soft benign +bs B/l dressing in place Aaxoxo3 grossly intact  Data Reviewed: I have personally reviewed following labs and imaging studies  CBC: Recent Labs  Lab 04/20/21 0605 04/21/21 0536 04/22/21 0727 04/23/21 0532 04/24/21 0430  WBC 10.6* 10.8* 14.7* 13.2* 11.6*  NEUTROABS 7.9* 8.1* 10.9* 10.2* 8.5*  HGB 10.8* 9.8* 10.8* 11.4* 10.3*  HCT 34.1* 29.4* 33.8* 33.7* 31.9*  MCV 94.2 94.5 93.6 93.9 92.7  PLT 347 310 329 331 735   Basic Metabolic Panel: Recent Labs  Lab 04/19/21 0618 04/20/21 0605 04/21/21 0536 04/22/21 0727 04/25/21 0414  NA 141 141 136 138 137  K 4.4 4.6 4.4 4.5 4.0  CL 115* 115* 113* 110 112*  CO2 19* 19* 18* 19* 19*  GLUCOSE 206* 185* 267* 220* 165*  BUN 41* 34* 30* 26* 19  CREATININE 1.57* 1.34* 1.51* 1.37* 1.25*  CALCIUM 8.3* 8.6* 7.9* 8.4* 7.7*   GFR: Estimated Creatinine Clearance: 56.3 mL/min (A) (by C-G formula based on SCr of 1.25 mg/dL (H)). Liver Function Tests: No results for input(s): AST, ALT, ALKPHOS, BILITOT, PROT, ALBUMIN in the last 168 hours.  No results for input(s): LIPASE, AMYLASE in the last 168 hours. No results for input(s): AMMONIA in the last 168 hours. Coagulation Profile: No results for input(s): INR, PROTIME in the last 168 hours.  Cardiac Enzymes: No results for input(s): CKTOTAL, CKMB, CKMBINDEX, TROPONINI in the last 168 hours.  BNP (last 3 results) No results for input(s): PROBNP in the last 8760 hours. HbA1C: No results for input(s): HGBA1C in the last 72 hours.  CBG: Recent Labs  Lab 04/23/21 2034 04/24/21 0711  04/24/21 1140 04/24/21 1709 04/24/21 2137  GLUCAP 244* 198* 196* 254* 147*   Lipid Profile: No results for input(s): CHOL, HDL, LDLCALC, TRIG, CHOLHDL, LDLDIRECT in the last 72 hours. Thyroid Function Tests: No results for input(s): TSH, T4TOTAL, FREET4, T3FREE, THYROIDAB in the last 72 hours. Anemia Panel: No results for input(s): VITAMINB12, FOLATE, FERRITIN, TIBC, IRON, RETICCTPCT in the last 72 hours. Sepsis Labs: No results for input(s): PROCALCITON, LATICACIDVEN in the last 168 hours.   Recent Results (from the past 240 hour(s))  Blood culture (routine x 2)     Status: None   Collection Time: 04/17/21 12:19 AM   Specimen: BLOOD  Result Value Ref Range Status   Specimen Description BLOOD RHAND  Final   Special Requests   Final    BOTTLES DRAWN AEROBIC AND ANAEROBIC Blood Culture adequate volume   Culture   Final    NO GROWTH 5 DAYS Performed at [facility], [address] [place], [place] [address]    Report Status 04/22/2021 FINAL  Final  Resp Panel by RT-PCR (Flu A&B, Covid) Nasopharyngeal Swab     Status: None   Collection Time: 04/17/21  1:34 AM   Specimen: Nasopharyngeal Swab; Nasopharyngeal(NP) swabs in vial transport medium  Result Value Ref Range Status   SARS Coronavirus 2 by RT PCR NEGATIVE NEGATIVE Final    Comment: (NOTE) SARS-CoV-2 target nucleic  acids are NOT DETECTED.  The SARS-CoV-2 RNA is generally detectable in upper respiratory specimens during the acute phase of infection. The lowest concentration of SARS-CoV-2 viral copies this assay can detect is 138 copies/mL. A negative result does not preclude SARS-Cov-2 infection and should not be used as the sole basis for treatment or other patient management decisions. A negative result may occur with  improper specimen collection/handling, submission of specimen other than nasopharyngeal swab, presence of viral mutation(s) within the areas targeted by this assay, and inadequate number of  viral copies(<138 copies/mL). A negative result must be combined with clinical observations, patient history, and epidemiological information. The expected result is Negative.  Fact Sheet for Patients:  BloggerCourse.com  Fact Sheet for Healthcare Providers:  SeriousBroker.it  This test is no t yet approved or cleared by the [place] FDA and  has been authorized for detection and/or diagnosis of SARS-CoV-2 by FDA under an Emergency Use Authorization (EUA). This EUA will remain  in effect (meaning this test can be used) for the duration of the COVID-19 declaration under Section 564(b)(1) of the Act, 21 U.S.C.section 360bbb-3(b)(1), unless the authorization is terminated  or revoked sooner.       Influenza A by PCR NEGATIVE NEGATIVE Final   Influenza B by PCR NEGATIVE NEGATIVE Final    Comment: (NOTE) The Xpert Xpress SARS-CoV-2/FLU/RSV plus assay is intended as an aid in the diagnosis of influenza from Nasopharyngeal swab specimens and should not be used as a sole basis for treatment. Nasal washings and aspirates are unacceptable for Xpert Xpress SARS-CoV-2/FLU/RSV testing.  Fact Sheet for Patients: BloggerCourse.com  Fact Sheet for Healthcare Providers: SeriousBroker.it  This test is not yet approved or cleared by the [place] FDA and has been authorized for detection and/or diagnosis of SARS-CoV-2 by FDA under an Emergency Use Authorization (EUA). This EUA will remain in effect (meaning this test can be used) for the duration of the COVID-19 declaration under Section 564(b)(1) of the Act, 21 U.S.C. section 360bbb-3(b)(1), unless the authorization is terminated or revoked.  Performed at [facility], [address] [place], [place] [address]   Blood culture (routine x 2)     Status: None   Collection Time: 04/17/21  2:25 AM   Specimen: BLOOD  Result  Value Ref Range Status   Specimen Description BLOOD BLOOD LEFT HAND  Final   Special Requests   Final    BOTTLES DRAWN AEROBIC AND ANAEROBIC Blood Culture adequate volume   Culture   Final    NO GROWTH 5 DAYS Performed at [facility], [address] [place], [place] [address]    Report Status 04/22/2021 FINAL  Final  Surgical pcr screen     Status: None   Collection Time: 04/19/21  1:09 PM   Specimen: Nasal Mucosa; Nasal Swab  Result Value Ref Range Status   MRSA, PCR NEGATIVE NEGATIVE Final   Staphylococcus aureus NEGATIVE NEGATIVE Final    Comment: (NOTE) The Xpert SA Assay (FDA approved for NASAL specimens in patients 58 years of age and older), is one component of a comprehensive surveillance program. It is not intended to diagnose infection nor to guide or monitor treatment. Performed at [facility], [address] [place], [place] [address]   Aerobic/Anaerobic Culture w Gram Stain (surgical/deep wound)     Status: None (Preliminary result)   Collection Time: 04/21/21  6:20 PM   Specimen: Wound  Result Value Ref Range Status   Specimen Description  Final    WOUND Performed at [facility], [address] [place], [place] [address]    Special Requests   Final    NONE Performed at [facility], [address] [place], [place] [address]    Gram Stain   Final    NO WBC SEEN NO ORGANISMS SEEN Performed at [facility], [place] [address] [place], [place] [address]    Culture   Final    ABUNDANT PROTEUS VULGARIS NO ANAEROBES ISOLATED; CULTURE IN PROGRESS FOR 5 DAYS    Report Status PENDING  Incomplete   Organism ID, Bacteria PROTEUS VULGARIS  Final      Susceptibility   Proteus vulgaris - MIC*    AMPICILLIN >=32 RESISTANT Resistant     CEFAZOLIN >=64 RESISTANT Resistant     CEFEPIME <=0.12 SENSITIVE Sensitive     CEFTAZIDIME <=1 SENSITIVE Sensitive     CIPROFLOXACIN <=0.25 SENSITIVE Sensitive     GENTAMICIN <=1  SENSITIVE Sensitive     IMIPENEM 2 SENSITIVE Sensitive     TRIMETH/SULFA <=20 SENSITIVE Sensitive     AMPICILLIN/SULBACTAM 8 SENSITIVE Sensitive     PIP/TAZO <=4 SENSITIVE Sensitive     * ABUNDANT PROTEUS VULGARIS         Radiology Studies: No results found.      Scheduled Meds:  acidophilus  2 capsule Oral Daily   amLODipine  5 mg Oral Daily   atorvastatin  20 mg Oral Daily   cloNIDine  0.3 mg Oral TID   enoxaparin (LOVENOX) injection  0.5 mg/kg Subcutaneous Q24H   gabapentin  300 mg Oral BID   hydrocerin   Topical Daily   insulin aspart  0-15 Units Subcutaneous TID WC   insulin aspart  0-5 Units Subcutaneous QHS   levothyroxine  112 mcg Oral Q0600   metoprolol succinate  50 mg Oral Q breakfast   Continuous Infusions:  sodium chloride Stopped (04/23/21 1727)   ceFEPime (MAXIPIME) IV 2 g (04/24/21 2333)     LOS: 8 days    Time spent: 35 minutes with more than 50% on [place], MD [facility]   If 7PM-7AM, please contact night-coverage  04/25/2021, 8:21 AM

## 2021-04-25 NOTE — Progress Notes (Signed)
      Daily Progress Note  Per instructions from Dr. Ronalee Belts, will preop pt for tomorrow. - NPO after midnight - BMP/CBC in AM  Adele Barthel, MD, FACS, FSVS Covering for [facility]: [phone]  04/25/2021, 8:19 AM

## 2021-04-26 DIAGNOSIS — L97929 Non-pressure chronic ulcer of unspecified part of left lower leg with unspecified severity: Secondary | ICD-10-CM | POA: Diagnosis not present

## 2021-04-26 DIAGNOSIS — F4322 Adjustment disorder with anxiety: Secondary | ICD-10-CM | POA: Diagnosis not present

## 2021-04-26 LAB — CBC
HCT: 32.7 % — ABNORMAL LOW (ref 36.0–46.0)
Hemoglobin: 10.3 g/dL — ABNORMAL LOW (ref 12.0–15.0)
MCH: 29.9 pg (ref 26.0–34.0)
MCHC: 31.5 g/dL (ref 30.0–36.0)
MCV: 95.1 fL (ref 80.0–100.0)
Platelets: 345 10*3/uL (ref 150–400)
RBC: 3.44 MIL/uL — ABNORMAL LOW (ref 3.87–5.11)
RDW: 18.1 % — ABNORMAL HIGH (ref 11.5–15.5)
WBC: 10.1 10*3/uL (ref 4.0–10.5)
nRBC: 0 % (ref 0.0–0.2)

## 2021-04-26 LAB — GLUCOSE, CAPILLARY
Glucose-Capillary: 184 mg/dL — ABNORMAL HIGH (ref 70–99)
Glucose-Capillary: 174 mg/dL — ABNORMAL HIGH (ref 70–99)
Glucose-Capillary: 103 mg/dL — ABNORMAL HIGH (ref 70–99)
Glucose-Capillary: 110 mg/dL — ABNORMAL HIGH (ref 70–99)
Glucose-Capillary: 187 mg/dL — ABNORMAL HIGH (ref 70–99)

## 2021-04-26 LAB — BASIC METABOLIC PANEL
Anion gap: 4 — ABNORMAL LOW (ref 5–15)
BUN: 23 mg/dL (ref 8–23)
CO2: 21 mmol/L — ABNORMAL LOW (ref 22–32)
Calcium: 7.8 mg/dL — ABNORMAL LOW (ref 8.9–10.3)
Chloride: 110 mmol/L (ref 98–111)
Creatinine, Ser: 1.51 mg/dL — ABNORMAL HIGH (ref 0.44–1.00)
GFR, Estimated: 38 mL/min — ABNORMAL LOW (ref >60)
Glucose, Bld: 199 mg/dL — ABNORMAL HIGH (ref 70–99)
Potassium: 4 mmol/L (ref 3.5–5.1)
Sodium: 135 mmol/L (ref 135–145)

## 2021-04-26 MED ORDER — SODIUM CHLORIDE 0.45 % IV SOLN: INTRAVENOUS | Status: DC

## 2021-04-26 NOTE — Progress Notes (Signed)
[facility] Daily Progress Note  Subjective: Patient much more alert today.  No complaints this AM.  No acute issues overnight.  Objective: Vitals:   04/25/21 0727 04/25/21 1605 04/25/21 2005 04/26/21 0735  BP: (!) 156/58 (!) 128/56 (!) 143/54 (!) 174/64  Pulse: (!) 53 (!) 56 (!) 57 (!) 58  Resp: 18 18 16 18   Temp: 98.6 F (37 C) 98 F (36.7 C) 98.5 F (36.9 C) 98.6 F (37 C)  TempSrc: Oral Oral Oral   SpO2: 98% 94% 98% 100%  Weight:      Height:        Intake/Output Summary (Last 24 hours) at 04/26/2021 1451 Last data filed at 04/26/2021 1028 Gross per 24 hour  Intake 400 ml  Output 200 ml  Net 200 ml   Physical Exam: A&Ox3, NAD CV: RRR Pulmonary: CTA Bilaterally Abdomen: Soft, Nontender, Nondistended Vascular:  Right lower extremity: Thigh soft. Calf soft. Extremities warm distally. Unable to palpate pedal pulses. Wounds are stable.  Left lower extremity: Thigh soft. Calf soft. Extremities warm distally. Unable to palpate pedal pulses. Wounds are stable.   Laboratory: CBC    Component Value Date/Time   WBC 10.1 04/26/2021 0424   HGB 10.3 (L) 04/26/2021 0424   HGB 12.5 02/20/2012 0737   HCT 32.7 (L) 04/26/2021 0424   HCT 37.6 02/20/2012 0737   PLT 345 04/26/2021 0424   PLT 230 02/20/2012 0737   BMET    Component Value Date/Time   NA 135 04/26/2021 0424   NA 137 10/22/2020 1450   NA 139 02/20/2012 0737   K 4.0 04/26/2021 0424   K 4.7 02/20/2012 0737   CL 110 04/26/2021 0424   CL 103 02/20/2012 0737   CO2 21 (L) 04/26/2021 0424   CO2 27 02/20/2012 0737   GLUCOSE 199 (H) 04/26/2021 0424   GLUCOSE 123 (H) 02/20/2012 0737   BUN 23 04/26/2021 0424   BUN 27 10/22/2020 1450   BUN 26 (H) 02/20/2012 0737   CREATININE 1.51 (H) 04/26/2021 0424   CREATININE 1.31 (H) 02/20/2012 0737   CALCIUM 7.8 (L) 04/26/2021 0424   CALCIUM 9.3 02/20/2012 0737   GFRNONAA 38 (L) 04/26/2021 0424   GFRNONAA 45 (L) 02/20/2012 0737   GFRAA 39 (L)  04/30/2020 1408   GFRAA 52 (L) 02/20/2012 0737   Assessment/Planning: The patient is a 65 year old female with extensive past medical history who was brought in via EMS for evaluation of bilateral lower extremity wounds, redness, swelling, and pain.    1) Atherosclerotic Disease with Gangrene: Patient with severe atherosclerotic disease to the bilateral lower extremity in the setting of multiple ulcerations which are nonhealing.  We have been planning to move forward with an angiogram since last week however the patient refused multiple times.  Patient was on the schedule today however due to an emergency we were not able to complete an angiogram.  I did have the opportunity to have another long discussion with the patient in regard to her diagnosis of peripheral artery disease - what it means and its role in wound formation and nonhealing.  We also discussed what would happen if she chooses not to move forward with any type of intervention.  She understands that her wounds will not heal and progressively get worse and she is at risk for amputation.  At this time, the patient is consenting to move forward with an angiogram.  We will plan on this tomorrow with Dr. Delana Meyer.  2) Bilateral Lower  Extremity Cellulitis: On IV antibiotics. Patient will need a formal venous insufficiency/lymphedema work-up when she is discharged.  Discussed with Dew / Tamera Stands PA-C 04/26/2021 2:51 PM

## 2021-04-26 NOTE — H&P (View-Only) (Signed)
[facility] Daily Progress Note  Subjective: Patient much more alert today.  No complaints this AM.  No acute issues overnight.  Objective: Vitals:   04/25/21 0727 04/25/21 1605 04/25/21 2005 04/26/21 0735  BP: (!) 156/58 (!) 128/56 (!) 143/54 (!) 174/64  Pulse: (!) 53 (!) 56 (!) 57 (!) 58  Resp: 18 18 16 18   Temp: 98.6 F (37 C) 98 F (36.7 C) 98.5 F (36.9 C) 98.6 F (37 C)  TempSrc: Oral Oral Oral   SpO2: 98% 94% 98% 100%  Weight:      Height:        Intake/Output Summary (Last 24 hours) at 04/26/2021 1451 Last data filed at 04/26/2021 1028 Gross per 24 hour  Intake 400 ml  Output 200 ml  Net 200 ml   Physical Exam: A&Ox3, NAD CV: RRR Pulmonary: CTA Bilaterally Abdomen: Soft, Nontender, Nondistended Vascular:  Right lower extremity: Thigh soft. Calf soft. Extremities warm distally. Unable to palpate pedal pulses. Wounds are stable.  Left lower extremity: Thigh soft. Calf soft. Extremities warm distally. Unable to palpate pedal pulses. Wounds are stable.   Laboratory: CBC    Component Value Date/Time   WBC 10.1 04/26/2021 0424   HGB 10.3 (L) 04/26/2021 0424   HGB 12.5 02/20/2012 0737   HCT 32.7 (L) 04/26/2021 0424   HCT 37.6 02/20/2012 0737   PLT 345 04/26/2021 0424   PLT 230 02/20/2012 0737   BMET    Component Value Date/Time   NA 135 04/26/2021 0424   NA 137 10/22/2020 1450   NA 139 02/20/2012 0737   K 4.0 04/26/2021 0424   K 4.7 02/20/2012 0737   CL 110 04/26/2021 0424   CL 103 02/20/2012 0737   CO2 21 (L) 04/26/2021 0424   CO2 27 02/20/2012 0737   GLUCOSE 199 (H) 04/26/2021 0424   GLUCOSE 123 (H) 02/20/2012 0737   BUN 23 04/26/2021 0424   BUN 27 10/22/2020 1450   BUN 26 (H) 02/20/2012 0737   CREATININE 1.51 (H) 04/26/2021 0424   CREATININE 1.31 (H) 02/20/2012 0737   CALCIUM 7.8 (L) 04/26/2021 0424   CALCIUM 9.3 02/20/2012 0737   GFRNONAA 38 (L) 04/26/2021 0424   GFRNONAA 45 (L) 02/20/2012 0737   GFRAA 39 (L)  04/30/2020 1408   GFRAA 52 (L) 02/20/2012 0737   Assessment/Planning: The patient is a 65 year old female with extensive past medical history who was brought in via EMS for evaluation of bilateral lower extremity wounds, redness, swelling, and pain.    1) Atherosclerotic Disease with Gangrene: Patient with severe atherosclerotic disease to the bilateral lower extremity in the setting of multiple ulcerations which are nonhealing.  We have been planning to move forward with an angiogram since last week however the patient refused multiple times.  Patient was on the schedule today however due to an emergency we were not able to complete an angiogram.  I did have the opportunity to have another long discussion with the patient in regard to her diagnosis of peripheral artery disease - what it means and its role in wound formation and nonhealing.  We also discussed what would happen if she chooses not to move forward with any type of intervention.  She understands that her wounds will not heal and progressively get worse and she is at risk for amputation.  At this time, the patient is consenting to move forward with an angiogram.  We will plan on this tomorrow with Dr. Delana Meyer.  2) Bilateral Lower  Extremity Cellulitis: On IV antibiotics. Patient will need a formal venous insufficiency/lymphedema work-up when she is discharged.  Discussed with Dew / Tamera Stands PA-C 04/26/2021 2:51 PM

## 2021-04-26 NOTE — Progress Notes (Signed)
PROGRESS NOTE    [name]  ZOX:[id number] DOB: 10/20/1955 DOA: 04/16/2021 PCP: Juline Patch, MD    Brief Narrative:  unfortunate 65 year old female with extensive past medical history who was brought in via EMS for evaluation of bilateral lower extremity redness swelling and pain.  Apparently patient has been in her wheelchair at home unable to move for several days.  Lower extremity significant cellulitic changes.  On broad-spectrum antibiotics.  Also some question of fracture in the feet on the right lower extremity.    Podiatry consulted on admission, recommendations appreciated.  Concern regarding vascular supply lower extremities.  Podiatry apparently reached out to vascular surgery for consultation prior to any surgical intervention.  Awaiting vascular evaluation to assess lower extremity vasculopathy.   Patient remains hemodynamically stable.  Remains on broad-spectrum antibiotics, IV fluids, as needed pain control.  Appreciate WOC consult.  Appreciate vascular evaluation and recommendations.  ABI completed with results significant for severe bilateral peripheral arterial disease.  Pending podiatry follow-up.  10/26 angiogram cancelled. Concern if pt is competent to make decisions.psych consulted 10/27 psychiatry evaluated patient and found her to be competent in decision making.  Plan for angiogram possibly tomorrow. 10/28- claims having diarrhea, but more like soft stool . She believes its her "nerves". Declined angiogram today, vascular was notified.  10/29 "diarrhea resolved, states was likely her "nerves" causing it. Agreeable to angiogram on Monday. Told her will be npo on Sunday night and to please comply. 10/31-  no complaints this am. Angiogram today.  Assessment & Plan:   Principal Problem:   Adjustment disorder with anxiety Active Problems:   Sepsis due to cellulitis ([place])   Pressure injury of skin  Severe bilateral lower extremity cellulitis,  nonpurulent Sepsis secondary to above Sepsis criteria met with tachycardia and leukocytosis.  Presumed source bilateral lower extremity nonpurulent cellulitis Concern regarding vascular supply Podiatry and vascular consulted MRSA screen negative 7/26 vancomycin and cefepime were discontinued.  Patient was started on Ancef for nonpurulent cellulitis Podiatry and vascular following 10/30-sensitivity to wound culture came back we will switch her antibiotics to cefepime as currently resistant to cefazolin. 10/31 plan for angiogram today    Acute kidney injury on chronic kidney disease stage IIIb ATN versus prerenal azotemia 10/31 creatinine mildly elevated at 1.51 Continue to hold telmisartan Start IV fluids for hydration specially since she is going for angiogram      Hyperkalemia Improved and remained stable   Hypothyroidism Continue Synthroid   Type 2 diabetes mellitus with peripheral neuropathy Oral medications on hold BG overall stable Continue RISS Continue Neurontin  Essential hypertension Better controlled Continue Toprol, clonidine, amlodipine ARB on hold as above IV hydralazine   Morbid obesity BMI 81.19 This complicates overall care and prognosis Nutrition consult  Poor self-care TOC consult DSS evaluation pending   DVT prophylaxis: SQ Lovenox Code Status: Full Family Communication: None at bedside Disposition Plan: Status is: Inpatient  Remains inpatient appropriate because: Sepsis secondary to bilateral lower extremity cellulitis.  Receiving IV treatment Angiogram today     Level of care: Med-Surg  Consultants:  Podiatry Vascular surgery  Procedures:  None  Antimicrobials: Cefazolin, started 10/25...>dc'd 10/29 Cefepime 10/29>>>    Subjective: No shortness of breath or chest pain   Objective: Vitals:   04/25/21 0727 04/25/21 1605 04/25/21 2005 04/26/21 0735  BP: (!) 156/58 (!) 128/56 (!) 143/54 (!) 174/64  Pulse: (!) 53  (!) 56 (!) 57 (!) 58  Resp: _0 Temp: 98.6 F (  37 C) 98 F (36.7 C) 98.5 F (36.9 C) 98.6 F (37 C)  TempSrc: Oral Oral Oral   SpO2: 98% 94% 98% 100%  Weight:      Height:        Intake/Output Summary (Last 24 hours) at 04/26/2021 0830 Last data filed at 04/26/2021 0414 Gross per 24 hour  Intake 390 ml  Output 200 ml  Net 190 ml   Filed Weights   04/16/21 2244 04/17/21 0403  Weight: 122.5 kg 118.5 kg    Examination: NAD calm  CTA no wheeze rales Regular S1-S2 no gallops Soft benign positive bowel sounds Dressing in place [id number]   Data Reviewed: I have personally reviewed following labs and imaging studies  CBC: Recent Labs  Lab 04/20/21 0605 04/21/21 0536 04/22/21 0727 04/23/21 0532 04/24/21 0430 04/26/21 0424  WBC 10.6* 10.8* 14.7* 13.2* 11.6* 10.1  NEUTROABS 7.9* 8.1* 10.9* 10.2* 8.5*  --   HGB 10.8* 9.8* 10.8* 11.4* 10.3* 10.3*  HCT 34.1* 29.4* 33.8* 33.7* 31.9* 32.7*  MCV 94.2 94.5 93.6 93.9 92.7 95.1  PLT 347 310 329 331 337 364   Basic Metabolic Panel: Recent Labs  Lab 04/20/21 0605 04/21/21 0536 04/22/21 0727 04/25/21 0414 04/26/21 0424  NA 141 136 138 137 135  K 4.6 4.4 4.5 4.0 4.0  CL 115* 113* 110 112* 110  CO2 19* 18* 19* 19* 21*  GLUCOSE 185* 267* 220* 165* 199*  BUN 34* 30* 26* 19 23  CREATININE 1.34* 1.51* 1.37* 1.25* 1.51*  CALCIUM 8.6* 7.9* 8.4* 7.7* 7.8*   GFR: Estimated Creatinine Clearance: 46.6 mL/min (A) (by C-G formula based on SCr of 1.51 mg/dL (H)). Liver Function Tests: No results for input(s): AST, ALT, ALKPHOS, BILITOT, PROT, ALBUMIN in the last 168 hours.  No results for input(s): LIPASE, AMYLASE in the last 168 hours. No results for input(s): AMMONIA in the last 168 hours. Coagulation Profile: No results for input(s): INR, PROTIME in the last 168 hours.  Cardiac Enzymes: No results for input(s): CKTOTAL, CKMB, CKMBINDEX, TROPONINI in the last 168 hours.  BNP (last 3 results) No results for  input(s): PROBNP in the last 8760 hours. HbA1C: No results for input(s): HGBA1C in the last 72 hours.  CBG: Recent Labs  Lab 04/25/21 0729 04/25/21 1150 04/25/21 1606 04/25/21 2144 04/25/21 2322  GLUCAP 174* 171* 192* 238* 246*   Lipid Profile: No results for input(s): CHOL, HDL, LDLCALC, TRIG, CHOLHDL, LDLDIRECT in the last 72 hours. Thyroid Function Tests: No results for input(s): TSH, T4TOTAL, FREET4, T3FREE, THYROIDAB in the last 72 hours. Anemia Panel: No results for input(s): VITAMINB12, FOLATE, FERRITIN, TIBC, IRON, RETICCTPCT in the last 72 hours. Sepsis Labs: No results for input(s): PROCALCITON, LATICACIDVEN in the last 168 hours.   Recent Results (from the past 240 hour(s))  Blood culture (routine x 2)     Status: None   Collection Time: 04/17/21 12:19 AM   Specimen: BLOOD  Result Value Ref Range Status   Specimen Description BLOOD RHAND  Final   Special Requests   Final    BOTTLES DRAWN AEROBIC AND ANAEROBIC Blood Culture adequate volume   Culture   Final    NO GROWTH 5 DAYS Performed at [facility], [address], [place], [place] [address]    Report Status 04/22/2021 FINAL  Final  Resp Panel by RT-PCR (Flu A&B, Covid) Nasopharyngeal Swab     Status: None   Collection Time: 04/17/21  1:34 AM   Specimen: Nasopharyngeal Swab; Nasopharyngeal(NP)  swabs in vial transport medium  Result Value Ref Range Status   SARS Coronavirus 2 by RT PCR NEGATIVE NEGATIVE Final    Comment: (NOTE) SARS-CoV-2 target nucleic acids are NOT DETECTED.  The SARS-CoV-2 RNA is generally detectable in upper respiratory specimens during the acute phase of infection. The lowest concentration of SARS-CoV-2 viral copies this assay can detect is 138 copies/mL. A negative result does not preclude SARS-Cov-2 infection and should not be used as the sole basis for treatment or other patient management decisions. A negative result may occur with  improper specimen  collection/handling, submission of specimen other than nasopharyngeal swab, presence of viral mutation(s) within the areas targeted by this assay, and inadequate number of viral copies(<138 copies/mL). A negative result must be combined with clinical observations, patient history, and epidemiological information. The expected result is Negative.  Fact Sheet for Patients:  EntrepreneurPulse.com.au  Fact Sheet for Healthcare Providers:  IncredibleEmployment.be  This test is no t yet approved or cleared by the [place] FDA and  has been authorized for detection and/or diagnosis of SARS-CoV-2 by FDA under an Emergency Use Authorization (EUA). This EUA will remain  in effect (meaning this test can be used) for the duration of the COVID-19 declaration under Section 564(b)(1) of the Act, 21 U.S.C.section 360bbb-3(b)(1), unless the authorization is terminated  or revoked sooner.       Influenza A by PCR NEGATIVE NEGATIVE Final   Influenza B by PCR NEGATIVE NEGATIVE Final    Comment: (NOTE) The Xpert Xpress SARS-CoV-2/FLU/RSV plus assay is intended as an aid in the diagnosis of influenza from Nasopharyngeal swab specimens and should not be used as a sole basis for treatment. Nasal washings and aspirates are unacceptable for Xpert Xpress SARS-CoV-2/FLU/RSV testing.  Fact Sheet for Patients: EntrepreneurPulse.com.au  Fact Sheet for Healthcare Providers: IncredibleEmployment.be  This test is not yet approved or cleared by the [place] FDA and has been authorized for detection and/or diagnosis of SARS-CoV-2 by FDA under an Emergency Use Authorization (EUA). This EUA will remain in effect (meaning this test can be used) for the duration of the COVID-19 declaration under Section 564(b)(1) of the Act, 21 U.S.C. section 360bbb-3(b)(1), unless the authorization is terminated or revoked.  Performed at [facility], [address] [place], [place] [address]   Blood culture (routine x 2)     Status: None   Collection Time: 04/17/21  2:25 AM   Specimen: BLOOD  Result Value Ref Range Status   Specimen Description BLOOD BLOOD LEFT HAND  Final   Special Requests   Final    BOTTLES DRAWN AEROBIC AND ANAEROBIC Blood Culture adequate volume   Culture   Final    NO GROWTH 5 DAYS Performed at [facility], [address], [place], [place] [address]    Report Status 04/22/2021 FINAL  Final  Surgical pcr screen     Status: None   Collection Time: 04/19/21  1:09 PM   Specimen: Nasal Mucosa; Nasal Swab  Result Value Ref Range Status   MRSA, PCR NEGATIVE NEGATIVE Final   Staphylococcus aureus NEGATIVE NEGATIVE Final    Comment: (NOTE) The Xpert SA Assay (FDA approved for NASAL specimens in patients 29 years of age and older), is one component of a comprehensive surveillance program. It is not intended to diagnose infection nor to guide or monitor treatment. Performed at [facility], [address] [place], [place] [address]   Aerobic/Anaerobic Culture w Gram Stain (surgical/deep wound)  Status: None (Preliminary result)   Collection Time: 04/21/21  6:20 PM   Specimen: Wound  Result Value Ref Range Status   Specimen Description   Final    WOUND Performed at [facility], [address], [place], [place] [address]    Special Requests   Final    NONE Performed at [facility], [address] [place], [place] [address]    Gram Stain   Final    NO WBC SEEN NO ORGANISMS SEEN Performed at [facility], [place] [address], [place], [place] [address]    Culture   Final    ABUNDANT PROTEUS VULGARIS NO ANAEROBES ISOLATED; CULTURE IN PROGRESS FOR 5 DAYS    Report Status PENDING  Incomplete   Organism ID, Bacteria PROTEUS VULGARIS  Final      Susceptibility   Proteus vulgaris - MIC*    AMPICILLIN >=32 RESISTANT Resistant     CEFAZOLIN  >=64 RESISTANT Resistant     CEFEPIME <=0.12 SENSITIVE Sensitive     CEFTAZIDIME <=1 SENSITIVE Sensitive     CIPROFLOXACIN <=0.25 SENSITIVE Sensitive     GENTAMICIN <=1 SENSITIVE Sensitive     IMIPENEM 2 SENSITIVE Sensitive     TRIMETH/SULFA <=20 SENSITIVE Sensitive     AMPICILLIN/SULBACTAM 8 SENSITIVE Sensitive     PIP/TAZO <=4 SENSITIVE Sensitive     * ABUNDANT PROTEUS VULGARIS         Radiology Studies: No results found.      Scheduled Meds:  acidophilus  2 capsule Oral Daily   amLODipine  5 mg Oral Daily   atorvastatin  20 mg Oral Daily   cloNIDine  0.3 mg Oral TID   enoxaparin (LOVENOX) injection  0.5 mg/kg Subcutaneous Q24H   gabapentin  300 mg Oral BID   hydrocerin   Topical Daily   insulin aspart  0-15 Units Subcutaneous TID WC   insulin aspart  0-5 Units Subcutaneous QHS   levothyroxine  112 mcg Oral Q0600   metoprolol succinate  50 mg Oral Q breakfast   Continuous Infusions:  sodium chloride Stopped (04/23/21 1727)   ceFEPime (MAXIPIME) IV 2 g (04/25/21 2033)     LOS: 9 days    Time spent: 35 minutes with more than 50% on [place], MD [facility]   If 7PM-7AM, please contact night-coverage  04/26/2021, 8:30 AM

## 2021-04-27 ENCOUNTER — Ambulatory Visit: Payer: Medicare HMO | Admitting: Podiatry

## 2021-04-27 ENCOUNTER — Encounter
Admission: EM | Disposition: A | Discharge status: Skilled Nursing Facility | Payer: Self-pay | Source: Home / Self Care | Attending: Internal Medicine

## 2021-04-27 DIAGNOSIS — I70263 Atherosclerosis of native arteries of extremities with gangrene, bilateral legs: Secondary | ICD-10-CM

## 2021-04-27 DIAGNOSIS — F4322 Adjustment disorder with anxiety: Secondary | ICD-10-CM | POA: Diagnosis not present

## 2021-04-27 HISTORY — PX: LOWER EXTREMITY ANGIOGRAPHY: CATH118251

## 2021-04-27 LAB — BASIC METABOLIC PANEL
Anion gap: 6 (ref 5–15)
BUN: 22 mg/dL (ref 8–23)
CO2: 20 mmol/L — ABNORMAL LOW (ref 22–32)
Calcium: 8.3 mg/dL — ABNORMAL LOW (ref 8.9–10.3)
Chloride: 108 mmol/L (ref 98–111)
Creatinine, Ser: 1.36 mg/dL — ABNORMAL HIGH (ref 0.44–1.00)
GFR, Estimated: 43 mL/min — ABNORMAL LOW (ref >60)
Glucose, Bld: 187 mg/dL — ABNORMAL HIGH (ref 70–99)
Potassium: 4.1 mmol/L (ref 3.5–5.1)
Sodium: 134 mmol/L — ABNORMAL LOW (ref 135–145)

## 2021-04-27 LAB — AEROBIC/ANAEROBIC CULTURE W GRAM STAIN (SURGICAL/DEEP WOUND): Gram Stain: NONE SEEN

## 2021-04-27 LAB — GLUCOSE, CAPILLARY
Glucose-Capillary: 111 mg/dL — ABNORMAL HIGH (ref 70–99)
Glucose-Capillary: 177 mg/dL — ABNORMAL HIGH (ref 70–99)
Glucose-Capillary: 173 mg/dL — ABNORMAL HIGH (ref 70–99)
Glucose-Capillary: 110 mg/dL — ABNORMAL HIGH (ref 70–99)
Glucose-Capillary: 84 mg/dL (ref 70–99)
Glucose-Capillary: 88 mg/dL (ref 70–99)
Glucose-Capillary: 180 mg/dL — ABNORMAL HIGH (ref 70–99)

## 2021-04-27 SURGERY — LOWER EXTREMITY ANGIOGRAPHY
Anesthesia: Moderate Sedation | Laterality: Right

## 2021-04-27 MED ORDER — DIPHENHYDRAMINE HCL 50 MG/ML IJ SOLN
INTRAMUSCULAR | Status: DC | PRN
  Administered 2021-04-27: 50 mg via INTRAVENOUS

## 2021-04-27 MED ORDER — OXYCODONE HCL 5 MG PO TABS
5.0000 mg | ORAL_TABLET | ORAL | Status: DC | PRN
  Administered 2021-04-27 – 2021-04-29 (×3): 5 mg via ORAL
  Administered 2021-04-30 – 2021-05-06 (×8): 10 mg via ORAL
  Filled 2021-04-27 (×4): qty 2
  Filled 2021-04-27: qty 1
  Filled 2021-04-27 (×2): qty 2
  Filled 2021-04-27 (×2): qty 1
  Filled 2021-04-27 (×2): qty 2

## 2021-04-27 MED ORDER — MIDAZOLAM HCL 2 MG/2ML IJ SOLN
INTRAMUSCULAR | Status: AC
  Filled 2021-04-27: qty 2

## 2021-04-27 MED ORDER — CLOPIDOGREL BISULFATE 75 MG PO TABS
75.0000 mg | ORAL_TABLET | Freq: Every day | ORAL | Status: DC
  Administered 2021-04-28 – 2021-05-18 (×19): 75 mg via ORAL
  Filled 2021-04-27 (×19): qty 1

## 2021-04-27 MED ORDER — FENTANYL CITRATE PF 50 MCG/ML IJ SOSY
PREFILLED_SYRINGE | INTRAMUSCULAR | Status: AC
  Filled 2021-04-27: qty 1

## 2021-04-27 MED ORDER — MIDAZOLAM HCL 2 MG/ML PO SYRP: 8.0000 mg | ORAL_SOLUTION | Freq: Once | ORAL | Status: DC | PRN

## 2021-04-27 MED ORDER — LABETALOL HCL 5 MG/ML IV SOLN: 10.0000 mg | INTRAVENOUS | Status: DC | PRN

## 2021-04-27 MED ORDER — MORPHINE SULFATE (PF) 2 MG/ML IV SOLN: 2.0000 mg | INTRAVENOUS | Status: DC | PRN

## 2021-04-27 MED ORDER — HYDRALAZINE HCL 20 MG/ML IJ SOLN: 5.0000 mg | INTRAMUSCULAR | Status: DC | PRN

## 2021-04-27 MED ORDER — SODIUM CHLORIDE 0.9% FLUSH
3.0000 mL | Freq: Two times a day (BID) | INTRAVENOUS | Status: DC
  Administered 2021-04-28 – 2021-05-18 (×36): 3 mL via INTRAVENOUS

## 2021-04-27 MED ORDER — DIPHENHYDRAMINE HCL 50 MG/ML IJ SOLN: 50.0000 mg | Freq: Once | INTRAMUSCULAR | Status: DC | PRN

## 2021-04-27 MED ORDER — FAMOTIDINE 20 MG PO TABS: 40.0000 mg | ORAL_TABLET | Freq: Once | ORAL | Status: DC | PRN

## 2021-04-27 MED ORDER — HEPARIN SODIUM (PORCINE) 1000 UNIT/ML IJ SOLN
INTRAMUSCULAR | Status: DC | PRN
  Administered 2021-04-27: 5000 [IU] via INTRAVENOUS

## 2021-04-27 MED ORDER — SODIUM CHLORIDE 0.9% FLUSH: 3.0000 mL | INTRAVENOUS | Status: DC | PRN

## 2021-04-27 MED ORDER — ACETAMINOPHEN 325 MG PO TABS: 650.0000 mg | ORAL_TABLET | ORAL | Status: DC | PRN

## 2021-04-27 MED ORDER — SODIUM CHLORIDE 0.9 % IV SOLN: 250.0000 mL | INTRAVENOUS | Status: DC | PRN

## 2021-04-27 MED ORDER — CEFAZOLIN SODIUM-DEXTROSE 2-4 GM/100ML-% IV SOLN
2.0000 g | Freq: Once | INTRAVENOUS | Status: AC
  Administered 2021-04-27: 2 g via INTRAVENOUS

## 2021-04-27 MED ORDER — HEPARIN SODIUM (PORCINE) 1000 UNIT/ML IJ SOLN
INTRAMUSCULAR | Status: AC
  Filled 2021-04-27: qty 1

## 2021-04-27 MED ORDER — SODIUM CHLORIDE FLUSH 0.9 % IV SOLN
INTRAVENOUS | Status: AC
  Filled 2021-04-27: qty 10

## 2021-04-27 MED ORDER — MIDAZOLAM HCL 2 MG/2ML IJ SOLN
INTRAMUSCULAR | Status: DC | PRN
  Administered 2021-04-27 (×3): 1 mg via INTRAVENOUS
  Administered 2021-04-27: 2 mg via INTRAVENOUS

## 2021-04-27 MED ORDER — HYDROMORPHONE HCL 1 MG/ML IJ SOLN
1.0000 mg | INTRAMUSCULAR | Status: DC | PRN
  Administered 2021-04-30 – 2021-05-04 (×4): 1 mg via INTRAVENOUS
  Filled 2021-04-27 (×5): qty 1

## 2021-04-27 MED ORDER — CLOPIDOGREL BISULFATE 75 MG PO TABS
300.0000 mg | ORAL_TABLET | Freq: Once | ORAL | Status: AC
  Administered 2021-04-27: 300 mg via ORAL
  Filled 2021-04-27: qty 4

## 2021-04-27 MED ORDER — ONDANSETRON HCL 4 MG/2ML IJ SOLN: 4.0000 mg | Freq: Four times a day (QID) | INTRAMUSCULAR | Status: DC | PRN

## 2021-04-27 MED ORDER — HYDROMORPHONE HCL 1 MG/ML IJ SOLN: 1.0000 mg | Freq: Once | INTRAMUSCULAR | Status: DC | PRN

## 2021-04-27 MED ORDER — METHYLPREDNISOLONE SODIUM SUCC 125 MG IJ SOLR: 125.0000 mg | Freq: Once | INTRAMUSCULAR | Status: DC | PRN

## 2021-04-27 MED ORDER — FENTANYL CITRATE (PF) 100 MCG/2ML IJ SOLN
INTRAMUSCULAR | Status: DC | PRN
  Administered 2021-04-27 (×5): 25 ug via INTRAVENOUS

## 2021-04-27 MED ORDER — METRONIDAZOLE 500 MG/100ML IV SOLN
500.0000 mg | Freq: Two times a day (BID) | INTRAVENOUS | Status: DC
Start: 2021-04-27 — End: 2021-05-01
  Administered 2021-04-27 – 2021-05-01 (×9): 500 mg via INTRAVENOUS
  Filled 2021-04-27 (×9): qty 100

## 2021-04-27 MED ORDER — SODIUM CHLORIDE 0.9 % IV SOLN: INTRAVENOUS | Status: AC

## 2021-04-27 MED ORDER — SODIUM CHLORIDE 0.9 % IV SOLN
INTRAVENOUS | Status: DC
Start: 2021-04-27 — End: 2021-04-27

## 2021-04-27 MED ORDER — DIPHENHYDRAMINE HCL 50 MG/ML IJ SOLN
INTRAMUSCULAR | Status: AC
  Filled 2021-04-27: qty 1

## 2021-04-27 SURGICAL SUPPLY — 19 items
BALLN LUTONIX DCB 5X60X130 (BALLOONS) ×2
BALLOON LUTONIX DCB 5X60X130 (BALLOONS) IMPLANT
CATH 0.018 NAVICROSS ANG 135 (CATHETERS) ×1 IMPLANT
CATH ANGIO 5F PIGTAIL 65CM (CATHETERS) ×1 IMPLANT
COVER PROBE U/S 5X48 (MISCELLANEOUS) ×1 IMPLANT
DEVICE STARCLOSE SE CLOSURE (Vascular Products) ×1 IMPLANT
GLIDEWIRE ADV .035X260CM (WIRE) ×1 IMPLANT
GUIDEWIRE PFTE-COATED .018X300 (WIRE) ×1 IMPLANT
KIT ENCORE 26 ADVANTAGE (KITS) ×1 IMPLANT
NDL ENTRY 21GA 7CM ECHOTIP (NEEDLE) IMPLANT
NEEDLE ENTRY 21GA 7CM ECHOTIP (NEEDLE) ×2 IMPLANT
PACK ANGIOGRAPHY (CUSTOM PROCEDURE TRAY) ×2 IMPLANT
SET INTRO CAPELLA COAXIAL (SET/KITS/TRAYS/PACK) ×1 IMPLANT
SHEATH BRITE TIP 5FRX11 (SHEATH) ×1 IMPLANT
SHEATH RAABE 6FRX70 (SHEATH) ×1 IMPLANT
STENT LIFESTENT 5F 7X100X135 (Permanent Stent) ×1 IMPLANT
SYR MEDRAD MARK 7 150ML (SYRINGE) ×1 IMPLANT
TUBING CONTRAST HIGH PRESS 72 (TUBING) ×1 IMPLANT
WIRE GUIDERIGHT .035X150 (WIRE) ×2 IMPLANT

## 2021-04-27 NOTE — Progress Notes (Signed)
PROGRESS NOTE    [name]  XKV:[id number] DOB: 16-Mar-1956 DOA: 04/16/2021 PCP: Duanne Limerick, MD    Brief Narrative:  unfortunate 65 year old female with extensive past medical history who was brought in via EMS for evaluation of bilateral lower extremity redness swelling and pain.  Apparently patient has been in her wheelchair at home unable to move for several days.  Lower extremity significant cellulitic changes.  On broad-spectrum antibiotics.  Also some question of fracture in the feet on the right lower extremity.   Podiatry consulted on admission,Concern regarding vascular supply lower extremities.  Podiatry apparently reached out to vascular surgery for consultation prior to any surgical intervention.  Awaiting vascular evaluation to assess lower extremity vasculopathy. WOC following. ABI completed with results significant for severe bilateral peripheral arterial disease.   Dr. Marylu Lund week: Patient has been waiting for angiogram to be completed, but kept changing her mind and finally decided to proceed. Podiatry had Concern whether pt is  competent to make decisions.psych consulted psychiatry evaluated patient and found her to be competent in decision making.  Had diarrhea, resolved. Thought was her nerves. Also added probiotics   11/1 plan for angiogram today    Assessment & Plan:   Principal Problem:   Adjustment disorder with anxiety Active Problems:   Sepsis due to cellulitis ([facility])   Pressure injury of skin  Sepsis Severe bilateral lower extremity cellulitis, nonpurulent Sepsis criteria met with tachycardia and leukocytosis.  Presumed source bilateral lower extremity nonpurulent cellulitis Concern regarding vascular supply Podiatry and vascular consulted MRSA screen negative 7/26 vancomycin and cefepime were discontinued.  Patient was started on Ancef for nonpurulent cellulitis 10/30-sensitivity to wound culture came back we will switch her antibiotics to  cefepime as currently resistant to cefazolin. 11/1 continue iv abx Angiogram today     Acute kidney injury on chronic kidney disease stage IIIb ATN versus prerenal azotemia 10/31 creatinine mildly elevated at 1.51 11/1 improving again with ivf Continue to hold telmisartan Monitor renal function post angiogram       Hyperkalemia Improved and remained stable   Hypothyroidism Continue Synthroid   Type 2 diabetes mellitus with peripheral neuropathy Oral medications on hold BG overall stable Continue RISS Continue Neurontin  Essential hypertension Better controlled Continue Toprol, clonidine, amlodipine ARB on hold as above IV hydralazine Bp mildly elevated today due to pt anxious about angiogram   Morbid obesity BMI 43.47 This complicates overall care and prognosis Nutrition consult  Poor self-care TOC consult DSS evaluation pending   DVT prophylaxis: SQ Lovenox Code Status: Full Family Communication: None at bedside Disposition Plan: Status is: Inpatient  Remains inpatient appropriate because: Sepsis secondary to bilateral lower extremity cellulitis.  Receiving IV treatment Angiogram today     Level of care: Med-Surg  Consultants:  Podiatry Vascular surgery  Procedures:  None  Antimicrobials: Cefazolin, started 10/25...>dc'd 10/29 Cefepime 10/29>>>    Subjective: No shortness of breath, chest pain or abdominal pain   Objective: Vitals:   04/26/21 1936 04/27/21 0317 04/27/21 0506 04/27/21 0717  BP: 120/70 (!) 155/66 (!) 165/67 (!) 149/60  Pulse: (!) 56 (!) 49 (!) 51 (!) 51  Resp: 18 16 16 18   Temp: 98.5 F (36.9 C) 97.6 F (36.4 C)  97.7 F (36.5 C)  TempSrc: Oral Oral  Oral  SpO2: 97% 97% 99% 100%  Weight:      Height:        Intake/Output Summary (Last 24 hours) at 04/27/2021 13/06/2020 Last data filed at 04/27/2021 [phone]  Gross per 24 hour  Intake 253.75 ml  Output 225 ml  Net 28.75 ml   Filed Weights   04/16/21 2244  04/17/21 0403  Weight: 122.5 kg 118.5 kg    Examination: NAD, calm CTA no wheeze rales rhonchi's Regular S1-S2 no gallops Soft benign positive bowel sounds Bilateral lower extremities dressings in place Awake oriented x3   Data Reviewed: I have personally reviewed following labs and imaging studies  CBC: Recent Labs  Lab 04/21/21 0536 04/22/21 0727 04/23/21 0532 04/24/21 0430 04/26/21 0424  WBC 10.8* 14.7* 13.2* 11.6* 10.1  NEUTROABS 8.1* 10.9* 10.2* 8.5*  --   HGB 9.8* 10.8* 11.4* 10.3* 10.3*  HCT 29.4* 33.8* 33.7* 31.9* 32.7*  MCV 94.5 93.6 93.9 92.7 95.1  PLT 310 329 331 337 062   Basic Metabolic Panel: Recent Labs  Lab 04/21/21 0536 04/22/21 0727 04/25/21 0414 04/26/21 0424 04/27/21 0558  NA 136 138 137 135 134*  K 4.4 4.5 4.0 4.0 4.1  CL 113* 110 112* 110 108  CO2 18* 19* 19* 21* 20*  GLUCOSE 267* 220* 165* 199* 187*  BUN 30* 26* $Remov'19 23 22  'BIlgfH$ CREATININE 1.51* 1.37* 1.25* 1.51* 1.36*  CALCIUM 7.9* 8.4* 7.7* 7.8* 8.3*   GFR: Estimated Creatinine Clearance: 51.7 mL/min (A) (by C-G formula based on SCr of 1.36 mg/dL (H)). Liver Function Tests: No results for input(s): AST, ALT, ALKPHOS, BILITOT, PROT, ALBUMIN in the last 168 hours.  No results for input(s): LIPASE, AMYLASE in the last 168 hours. No results for input(s): AMMONIA in the last 168 hours. Coagulation Profile: No results for input(s): INR, PROTIME in the last 168 hours.  Cardiac Enzymes: No results for input(s): CKTOTAL, CKMB, CKMBINDEX, TROPONINI in the last 168 hours.  BNP (last 3 results) No results for input(s): PROBNP in the last 8760 hours. HbA1C: No results for input(s): HGBA1C in the last 72 hours.  CBG: Recent Labs  Lab 04/26/21 1658 04/26/21 1720 04/26/21 2139 04/27/21 0406 04/27/21 0719  GLUCAP 110* 103* 187* 111* 177*   Lipid Profile: No results for input(s): CHOL, HDL, LDLCALC, TRIG, CHOLHDL, LDLDIRECT in the last 72 hours. Thyroid Function Tests: No results for  input(s): TSH, T4TOTAL, FREET4, T3FREE, THYROIDAB in the last 72 hours. Anemia Panel: No results for input(s): VITAMINB12, FOLATE, FERRITIN, TIBC, IRON, RETICCTPCT in the last 72 hours. Sepsis Labs: No results for input(s): PROCALCITON, LATICACIDVEN in the last 168 hours.   Recent Results (from the past 240 hour(s))  Surgical pcr screen     Status: None   Collection Time: 04/19/21  1:09 PM   Specimen: Nasal Mucosa; Nasal Swab  Result Value Ref Range Status   MRSA, PCR NEGATIVE NEGATIVE Final   Staphylococcus aureus NEGATIVE NEGATIVE Final    Comment: (NOTE) The Xpert SA Assay (FDA approved for NASAL specimens in patients 76 years of age and older), is one component of a comprehensive surveillance program. It is not intended to diagnose infection nor to guide or monitor treatment. Performed at [facility], [address] [place], [place] [address]   Aerobic/Anaerobic Culture w Gram Stain (surgical/deep wound)     Status: None (Preliminary result)   Collection Time: 04/21/21  6:20 PM   Specimen: Wound  Result Value Ref Range Status   Specimen Description   Final    WOUND Performed at [facility], [address], [place], [place] [address]    Special Requests   Final    NONE Performed at [facility], [address] [place], [place] [address]    Gram Stain NO WBC SEEN NO ORGANISMS SEEN   Final   Culture   Final    ABUNDANT PROTEUS VULGARIS HOLDING FOR POSSIBLE ANAEROBE Performed at [facility], [place] [address] [place], [place] [address]    Report Status PENDING  Incomplete   Organism ID, Bacteria PROTEUS VULGARIS  Final      Susceptibility   Proteus vulgaris - MIC*    AMPICILLIN >=32 RESISTANT Resistant     CEFAZOLIN >=64 RESISTANT Resistant     CEFEPIME <=0.12 SENSITIVE Sensitive     CEFTAZIDIME <=1 SENSITIVE Sensitive     CIPROFLOXACIN <=0.25 SENSITIVE Sensitive     GENTAMICIN <=1 SENSITIVE Sensitive     IMIPENEM 2 SENSITIVE  Sensitive     TRIMETH/SULFA <=20 SENSITIVE Sensitive     AMPICILLIN/SULBACTAM 8 SENSITIVE Sensitive     PIP/TAZO <=4 SENSITIVE Sensitive     * ABUNDANT PROTEUS VULGARIS         Radiology Studies: No results found.      Scheduled Meds:  acidophilus  2 capsule Oral Daily   amLODipine  5 mg Oral Daily   atorvastatin  20 mg Oral Daily   cloNIDine  0.3 mg Oral TID   enoxaparin (LOVENOX) injection  0.5 mg/kg Subcutaneous Q24H   gabapentin  300 mg Oral BID   hydrocerin   Topical Daily   insulin aspart  0-15 Units Subcutaneous TID WC   insulin aspart  0-5 Units Subcutaneous QHS   levothyroxine  112 mcg Oral Q0600   metoprolol succinate  50 mg Oral Q breakfast   Continuous Infusions:  sodium chloride 75 mL/hr at 04/27/21 0318   sodium chloride Stopped (04/23/21 1727)   ceFEPime (MAXIPIME) IV 2 g (04/26/21 2117)     LOS: 10 days    Time spent: 35 minutes with more than 50% on [place], MD [facility]   If 7PM-7AM, please contact night-coverage  04/27/2021, 8:08 AM

## 2021-04-27 NOTE — Progress Notes (Signed)
PT Cancellation Note  Patient Details Name: [name] MRN: [id number] DOB: [birth year]   Cancelled Treatment:    Reason Eval/Treat Not Completed: Patient at procedure or test/unavailable.  Pt having angiogram performed today and is currently off the floor for procedure.  Pt will re-evaluate pt tomorrow as medically appropriate.     Gwenlyn Saran, PT, DPT 04/27/21, 1:53 PM

## 2021-04-27 NOTE — Progress Notes (Signed)
Nurse called due to pt. Having server pt. Pt. Was crying reporting a squeezing feeling in her r. Leg. Pt. Points to a spot on her inner right tight that has a hard knot and was the source of her pain. Nurse medicated pt. And report to vascular PA. She assured that this was not uncommon and update pt. Meds. Pt. Appears to be comfortable after pain meds.

## 2021-04-27 NOTE — Progress Notes (Signed)
OT Cancellation Note  Patient Details Name: [name] MRN: [id number] DOB: 1956-02-17   Cancelled Treatment:    Reason Eval/Treat Not Completed: Patient at procedure or test/ unavailable. Pt out of the room. Per chart, plan for angiogram today. Will re-attempt OT at later date/time as pt is appropriate.  Ardeth Perfect., MPH, MS, OTR/L ascom [phone] 04/27/21, 1:51 PM

## 2021-04-27 NOTE — Interval H&P Note (Signed)
History and Physical Interval Note:  04/27/2021 2:48 PM  [name]  has presented today for surgery, with the diagnosis of PAD.  The various methods of treatment have been discussed with the patient and family. After consideration of risks, benefits and other options for treatment, the patient has consented to  Procedure(s): Lower Extremity Angiography (Right) as a surgical intervention.  The patient's history has been reviewed, patient examined, no change in status, stable for surgery.  I have reviewed the patient's chart and labs.  Questions were answered to the patient's satisfaction.     Hortencia Pilar

## 2021-04-28 ENCOUNTER — Encounter: Payer: Self-pay | Admitting: Vascular Surgery

## 2021-04-28 DIAGNOSIS — F4322 Adjustment disorder with anxiety: Secondary | ICD-10-CM | POA: Diagnosis not present

## 2021-04-28 DIAGNOSIS — L97929 Non-pressure chronic ulcer of unspecified part of left lower leg with unspecified severity: Secondary | ICD-10-CM | POA: Diagnosis not present

## 2021-04-28 LAB — GLUCOSE, CAPILLARY
Glucose-Capillary: 193 mg/dL — ABNORMAL HIGH (ref 70–99)
Glucose-Capillary: 177 mg/dL — ABNORMAL HIGH (ref 70–99)
Glucose-Capillary: 180 mg/dL — ABNORMAL HIGH (ref 70–99)
Glucose-Capillary: 189 mg/dL — ABNORMAL HIGH (ref 70–99)

## 2021-04-28 NOTE — Progress Notes (Signed)
PODIATRY / FOOT AND ANKLE SURGERY PROGRESS NOTE  Chief Complaint: Foot pain/infection/leg swelling   HPI: [name] is a 65 y.o. female who presents today resting in bed comfortably.  Patient had vascular procedure yesterday to improve blood flow to the right lower extremity.  Patient is resting today with dressings on bilateral lower extremities with Prevalon boots on.  PMHx:  Past Medical History:  Diagnosis Date   Diabetes mellitus without complication ([place]) 7893   Hypertension    Hypothyroidism    Kidney stone 11/2014   history of/STAGE 4 KIDNEY DISEASE PER DR Juleen China   Lymphedema    Multiple open wounds of lower leg    PT BEING SEEN BY THE WOUND CENTER FOR CHRONIC BLISTERS PER PT    Surgical Hx:  Past Surgical History:  Procedure Laterality Date   LOWER EXTREMITY ANGIOGRAPHY Right 04/27/2021   Procedure: Lower Extremity Angiography;  Surgeon: Katha Cabal, MD;  Location: [facility];  Service: Cardiovascular;  Laterality: Right;   TOTAL HIP ARTHROPLASTY Right 05/28/2015   Procedure: TOTAL HIP ARTHROPLASTY ANTERIOR APPROACH;  Surgeon: Hessie Knows, MD;  Location: [facility];  Service: Orthopedics;  Laterality: Right;    FHx:  Family History  Problem Relation Age of Onset   Cancer Mother    Diabetes Father     Social History:  reports that she has never smoked. She has never used smokeless tobacco. She reports that she does not drink alcohol and does not use drugs.  Allergies: No Known Allergies   Medications Prior to Admission  Medication Sig Dispense Refill   acetaminophen (TYLENOL) 500 MG tablet Take 500-1,000 mg by mouth every 6 (six) hours as needed for mild pain or moderate pain.     aspirin EC 81 MG tablet Take 1 tablet (81 mg total) by mouth daily. 90 tablet 3   atorvastatin (LIPITOR) 20 MG tablet Take 1 tablet (20 mg total) by mouth daily. 90 tablet 1   blood glucose meter kit and supplies 1 each by Other route daily. ONE TOUCH ULTRA METER.  Use as directed. E11.29 1 each 0   cloNIDine (CATAPRES) 0.3 MG tablet TAKE ONE TABLET BY MOUTH 3 TIMES DAILY. 270 tablet 1   furosemide (LASIX) 40 MG tablet Take 1 tablet (40 mg total) by mouth 2 (two) times daily. 180 tablet 1   gabapentin (NEURONTIN) 300 MG capsule Take 1 capsule (300 mg total) by mouth 3 (three) times daily as needed (pain). (Patient taking differently: Take 300 mg by mouth at bedtime.) 20 capsule 0   glipiZIDE (GLUCOTROL) 5 MG tablet TAKE 1 TABLET BY MOUTH twice a day 180 tablet 0   glucose blood test strip 1 each by Other route daily. ONE TOUCH ULTRA TEST STRIPS E11.29 100 strip 3   Lancets (ONETOUCH ULTRASOFT) lancets 1 each by Other route daily. Use as instructed E11.29 100 each 3   levothyroxine (SYNTHROID) 112 MCG tablet Take 1 tablet (112 mcg total) by mouth daily before breakfast. 90 tablet 0   metoprolol succinate (TOPROL-XL) 50 MG 24 hr tablet Take 1 tablet (50 mg total) by mouth daily. 90 tablet 1   telmisartan (MICARDIS) 80 MG tablet TAKE ONE TABLET BY MOUTH EVERY DAY 90 tablet 1    Physical Exam: General: Alert and oriented.  No apparent distress.  Vascular: DP/PT pulses unable to be palpated due to bilateral lower extremity swelling, lymphedema appears to be moderate to severe.  Feet do appear to be warm to touch bilateral.  Neuro: Light touch sensation reduced to bilateral lower extremities.  Derm: Large ulcerations to the posterior aspects of both lower legs near the calf area consistent with venous type ulcerations or lymphedema type wounds.  Serous drainage present from these areas, wound beds appear to be granular overall.  Large ulceration to the plantar posterior aspect the left heel which appears to be a pressure type ulceration that probes deep nearly to bone, area appears to be necrotic overall with large amount of fibrous tissue, no granularity noted, mild odor.  Distal tip of the left hallux appears to have a small superficial necrotic eschar  present to the distal tip.  No associated erythema or edema.  No drainage.  Right distal tip hallux with eschar present to the distal tip, no associated erythema or edema, no drainage.  Eschar present to the fourth webspace area of the right foot present to the lateral aspect of the fifth metatarsal phalangeal joint likely due to pressure ulceration.  Appears to have a small opening also at the plantar aspect of the fifth metatarsal phalangeal joint that probes to bone, mild odor present, no erythema or associated edema specifically to this area, mild serous drainage.  Preulcerative lesion to the plantar aspect of the right heel likely due to pressure present with mild hemorrhagic change.  Nails appear to be thickened, relatively well trimmed, discolored, dystrophic and brittle subungual debris.  Skin appears to be thin and atrophic to bilateral lower extremities with large areas of peeling dry skin present.  MSK: 3/5 strength bilateral lower extremity muscle groups.  Pain on palpation to the left heel and right fifth metatarsal phalangeal joint area as well as both big toes.  Pain on palpation of the posterior aspects of both lower legs in the area of large venous type ulcerations.  Results for orders placed or performed during the hospital encounter of 04/16/21 (from the past 48 hour(s))  Glucose, capillary     Status: Abnormal   Collection Time: 04/26/21  4:58 PM  Result Value Ref Range   Glucose-Capillary 110 (H) 70 - 99 mg/dL    Comment: Glucose reference range applies only to samples taken after fasting for at least 8 hours.  Glucose, capillary     Status: Abnormal   Collection Time: 04/26/21  5:20 PM  Result Value Ref Range   Glucose-Capillary 103 (H) 70 - 99 mg/dL    Comment: Glucose reference range applies only to samples taken after fasting for at least 8 hours.  Glucose, capillary     Status: Abnormal   Collection Time: 04/26/21  9:39 PM  Result Value Ref Range    Glucose-Capillary 187 (H) 70 - 99 mg/dL    Comment: Glucose reference range applies only to samples taken after fasting for at least 8 hours.   Comment 1 Notify RN   Glucose, capillary     Status: Abnormal   Collection Time: 04/27/21  4:06 AM  Result Value Ref Range   Glucose-Capillary 111 (H) 70 - 99 mg/dL    Comment: Glucose reference range applies only to samples taken after fasting for at least 8 hours.  Basic metabolic panel     Status: Abnormal   Collection Time: 04/27/21  5:58 AM  Result Value Ref Range   Sodium 134 (L) 135 - 145 mmol/L   Potassium 4.1 3.5 - 5.1 mmol/L   Chloride 108 98 - 111 mmol/L   CO2 20 (L) 22 - 32 mmol/L   Glucose, Bld 187 (H) 70 -  99 mg/dL    Comment: Glucose reference range applies only to samples taken after fasting for at least 8 hours.   BUN 22 8 - 23 mg/dL   Creatinine, Ser 1.36 (H) 0.44 - 1.00 mg/dL   Calcium 8.3 (L) 8.9 - 10.3 mg/dL   GFR, Estimated 43 (L) >60 mL/min    Comment: (NOTE) Calculated using the CKD-EPI Creatinine Equation (2021)    Anion gap 6 5 - 15    Comment: Performed at [facility], [address] [place], [place] [address]  Glucose, capillary     Status: Abnormal   Collection Time: 04/27/21  7:19 AM  Result Value Ref Range   Glucose-Capillary 177 (H) 70 - 99 mg/dL    Comment: Glucose reference range applies only to samples taken after fasting for at least 8 hours.  Glucose, capillary     Status: Abnormal   Collection Time: 04/27/21 11:32 AM  Result Value Ref Range   Glucose-Capillary 173 (H) 70 - 99 mg/dL    Comment: Glucose reference range applies only to samples taken after fasting for at least 8 hours.  Glucose, capillary     Status: Abnormal   Collection Time: 04/27/21  1:51 PM  Result Value Ref Range   Glucose-Capillary 110 (H) 70 - 99 mg/dL    Comment: Glucose reference range applies only to samples taken after fasting for at least 8 hours.  Glucose, capillary     Status: None   Collection Time:  04/27/21  4:25 PM  Result Value Ref Range   Glucose-Capillary 84 70 - 99 mg/dL    Comment: Glucose reference range applies only to samples taken after fasting for at least 8 hours.  Glucose, capillary     Status: None   Collection Time: 04/27/21  5:20 PM  Result Value Ref Range   Glucose-Capillary 88 70 - 99 mg/dL    Comment: Glucose reference range applies only to samples taken after fasting for at least 8 hours.  Glucose, capillary     Status: Abnormal   Collection Time: 04/27/21 10:16 PM  Result Value Ref Range   Glucose-Capillary 180 (H) 70 - 99 mg/dL    Comment: Glucose reference range applies only to samples taken after fasting for at least 8 hours.   Comment 1 Notify RN   Glucose, capillary     Status: Abnormal   Collection Time: 04/28/21  7:31 AM  Result Value Ref Range   Glucose-Capillary 193 (H) 70 - 99 mg/dL    Comment: Glucose reference range applies only to samples taken after fasting for at least 8 hours.  Glucose, capillary     Status: Abnormal   Collection Time: 04/28/21 11:41 AM  Result Value Ref Range   Glucose-Capillary 177 (H) 70 - 99 mg/dL    Comment: Glucose reference range applies only to samples taken after fasting for at least 8 hours.   PERIPHERAL VASCULAR CATHETERIZATION  Result Date: 04/27/2021 See surgical note for result.   Blood pressure (!) 121/39, pulse (!) 52, temperature 98.9 F (37.2 C), temperature source Oral, resp. rate 20, height _0  (1.651 m), weight 118.5 kg, SpO2 99 %.   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 Diabetes type 2 polyneuropathy PVD Failure to thrive  Plan -Patient seen and examined today. -Wounds appear to be stable at this time. -  Appreciate vascular recommendations.  Patient had angiogram to the right lower  extremity.  Vascular planning on repeat angiogram to the left lower extremity under anesthesia due to patient compliance issues during case.  They will plan for this at the end of this week or potentially early next week. -Discussed with patient that after she gets revascularized to the left lower extremity then we can proceed with surgical procedure for her feet and lower legs.  Would likely recommend partial right fifth ray amputation, wound debridements to the posterior aspects of both lower legs, left calcaneal wound debridement with bone biopsy and antibiotic bead placement with wound VAC therapy.  At some point the future patient could also potentially have to undergo amputations of both tips of both hallux due to dry stable eschars present.  Once again discussed with patient that we will proceed with this after patient has been revascularized to bilateral lower extremities.  Discussed case with vascular in detail. -Discussed with patient that she is at high risk for limb loss. -Continue with dressing instructions as ordered. -Wound culture growing Proteus.  Appreciate medicine recommendations for antibiotic therapy. -Likely that patient will have to see the wound care center after discharge for chronic lower extremity wounds.  She has been treated for these in the past at the wound care center.  We will continue to follow-up with patient after discharge as well.  For now we will follow-up with patient more peripherally until vascular is able to improve circulation.  Will await recommendations.  Caroline More, DPM 04/28/2021, 1:14 PM

## 2021-04-28 NOTE — Op Note (Signed)
[facility]  Percutaneous Study/Intervention Procedural Note   Date of Surgery: 04/28/2021  Surgeon:  Katha Cabal, MD.  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  Introduction catheter into right lower extremity 3rd order catheter placement              2.    Contrast injection right lower extremity for distal runoff             3.  Percutaneous transluminal angioplasty and stent placement right superficial femoral artery and popliteal             4.  Star close closure left common femoral arteriotomy  Anesthesia: Conscious sedation was administered under my direct supervision by the interventional radiology RN. IV Versed plus fentanyl were utilized. Continuous ECG, pulse oximetry and blood pressure was monitored throughout the entire procedure.  Conscious sedation was for a total of 64 minutes.  Sheath: 6 French 80 cm Rabie sheath left common femoral retrograde  Contrast: 85 cc  Fluoroscopy Time: 9.8 minutes  Indications:  [name] presents with advanced cellulitis with multiple ulcerations bilateral lower extremities.  She has nonpalpable pedal pulses and poor Doppler signals indicating atherosclerotic occlusive disease.  This places her at a very high risk for limb loss.  The risks and benefits are reviewed all questions answered patient agrees to proceed.  Procedure:  [name] is a 65 y.o. y.o. female who was identified and appropriate procedural time out was performed.  The patient was then placed supine on the table and prepped and draped in the usual sterile fashion.    Ultrasound was placed in the sterile sleeve and the left groin was evaluated the left common femoral artery was echolucent and pulsatile indicating patency.  Image was recorded for the permanent record and under  real-time visualization a microneedle was inserted into the common femoral artery microwire followed by a micro-sheath.  A J-wire was then advanced through the micro-sheath and a  5 [place] sheath was then inserted over a J-wire. J-wire was then advanced and a 5 French pigtail catheter was positioned at the level of T12. AP projection of the aorta was then obtained. Pigtail catheter was repositioned to above the bifurcation and a LAO view of the pelvis was obtained.  Subsequently a pigtail catheter with the stiff angle Glidewire was used to cross the aortic bifurcation the catheter wire were advanced down into the right distal external iliac artery. Oblique view of the femoral bifurcation was then obtained and subsequently the wire was reintroduced and the pigtail catheter negotiated into the SFA representing third order catheter placement. Distal runoff was then performed.  Diagnostic interpretation: The abdominal aorta is opacified with a bolus injection contrast is widely patent and minimal atherosclerotic changes.  Aortic bifurcation is widely patent as well.  The bilateral common internal and external iliac arteries are widely patent with minimal atherosclerotic changes.  The right common femoral profunda femoris and proximal SFA are widely patent.  There is mild atherosclerotic changes of the common and profunda femoris arteries.  The superficial femoral artery demonstrates a steady increase in calcifications and at Hunter's canal there is moderate irregularity.  This continues into the popliteal and in the mid popliteal at the level of the tibial peroneal plateau there is a focal 80% stenosis with what appears to  be some thrombus associated with the distally.  The distal popliteal is demonstrates increasing atherosclerotic changes but appears patent without hemodynamically significant stenosis and this is true of the tibioperoneal trunk.  The anterior tibial occludes within several millimeters of its  origin and remains occluded down to the level ankle.  Posterior tibial occludes several centimeters after its origin remains occluded down to the ankle.  Peroneal is patent down to the ankle and the typical collaterals seen fill the posterior tibial and dorsalis pedis at the level of the ankle.  Peroneal is the dominant runoff to the foot and appears diseased but widely patent.  5000 units of heparin was then given and allowed to circulate and a 6 [place] Rabie sheath was advanced up and over the bifurcation and positioned in the femoral artery  KMP  catheter and advantage Glidewire were then negotiated down into the peroneal.  A 4 mm x 60 mm Lutonix balloon was used to angioplasty the distal superficial femoral and popliteal arteries. Inflation was to 10 atmospheres for 1 minute. Follow-up imaging demonstrated patency with the thrombus still present creating greater than 50% diameter reduction.  Therefore a 7 mm x 100 mm life stent was deployed which was not postdilated given the thrombus. Distal runoff was then reassessed and now the popliteal artery is widely patent with less than 10% residual stenosis and no evidence of mobile thrombus remaining.  I then attempted to cross the posterior tibial lesion with a Ferd Hibbs cross catheter and a 018 advantage wire however this was unsuccessful.  After review of these images the sheath is pulled into the left external iliac oblique of the common femoral is obtained and a Star close device deployed. There no immediate complications.   Findings:   The abdominal aorta is opacified with a bolus injection contrast is widely patent and minimal atherosclerotic changes.  Aortic bifurcation is widely patent as well.  The bilateral common internal and external iliac arteries are widely patent with minimal atherosclerotic changes.  The right common femoral profunda femoris and proximal SFA are widely patent.  There is mild atherosclerotic changes of the common and profunda  femoris arteries.  The superficial femoral artery demonstrates a steady increase in calcifications and at Hunter's canal there is moderate irregularity.  This continues into the popliteal and in the mid popliteal at the level of the tibial peroneal plateau there is a focal 80% stenosis with what appears to be some thrombus associated with the distally.  The distal popliteal is demonstrates increasing atherosclerotic changes but appears patent without hemodynamically significant stenosis and this is true of the tibioperoneal trunk.  The anterior tibial occludes within several millimeters of its origin and remains occluded down to the level ankle.  Posterior tibial occludes several centimeters after its origin remains occluded down to the ankle.  Peroneal is patent down to the ankle and the typical collaterals seen fill the posterior tibial and dorsalis pedis at the level of the ankle.  Peroneal is the dominant runoff to the foot and appears diseased but widely patent.  Following angioplasty popliteal artery is patent but there is the previously noted thrombus present and therefore the life stent is deployed with successful exclusion of the thrombus and less than 5% residual stenosis of the SFA popliteal.     Summary: Successful recanalization right lower extremity for limb salvage                        Disposition: Patient was  taken to the recovery room in stable condition having tolerated the procedure well.  [name], [name] 04/28/2021,8:09 AM

## 2021-04-28 NOTE — Progress Notes (Signed)
[facility] Daily Progress Note  04/28/21:             1.  Introduction catheter into right lower extremity 3rd order catheter placement              2.    Contrast injection right lower extremity for distal runoff             3.  Percutaneous transluminal angioplasty and stent placement right superficial femoral artery and popliteal             4.  Star close closure left common femoral arteriotomy  Subjective: Patient with improved discomfort this afternoon.  No acute issues overnight.  Objective: Vitals:   04/27/21 1853 04/28/21 0522 04/28/21 0734 04/28/21 1128  BP:  (!) 128/55 (!) 127/45 (!) 121/39  Pulse:  (!) 52 (!) 51 (!) 52  Resp: 18 16 20    Temp:  97.9 F (36.6 C) 98.1 F (36.7 C) 98.9 F (37.2 C)  TempSrc:  Oral Oral Oral  SpO2: 95% 100% 99% 99%  Weight:      Height:        Intake/Output Summary (Last 24 hours) at 04/28/2021 1307 Last data filed at 04/28/2021 1136 Gross per 24 hour  Intake 1065.67 ml  Output 350 ml  Net 715.67 ml   Physical Exam: A&Ox3, NAD CV: RRR Pulmonary: CTA Bilaterally Abdomen: Soft, Non-tender, Non-distended Left groin access site:  PAD removed.  No hematoma or ecchymosis noted.  No drainage. Vascular:             Right lower extremity: Thigh soft. Calf soft. Extremities warm distally. Unable to palpate pedal pulses. Wounds are stable.             Left lower extremity: Thigh soft. Calf soft. Extremities warm distally. Unable to palpate pedal pulses. Wounds are stable.   Laboratory: CBC    Component Value Date/Time   WBC 10.1 04/26/2021 0424   HGB 10.3 (L) 04/26/2021 0424   HGB 12.5 02/20/2012 0737   HCT 32.7 (L) 04/26/2021 0424   HCT 37.6 02/20/2012 0737   PLT 345 04/26/2021 0424   PLT 230 02/20/2012 0737   BMET    Component Value Date/Time   NA 134 (L) 04/27/2021 0558   NA 137 10/22/2020 1450   NA 139 02/20/2012 0737   K 4.1 04/27/2021 0558   K 4.7 02/20/2012 0737   CL 108 04/27/2021 0558   CL 103  02/20/2012 0737   CO2 20 (L) 04/27/2021 0558   CO2 27 02/20/2012 0737   GLUCOSE 187 (H) 04/27/2021 0558   GLUCOSE 123 (H) 02/20/2012 0737   BUN 22 04/27/2021 0558   BUN 27 10/22/2020 1450   BUN 26 (H) 02/20/2012 0737   CREATININE 1.36 (H) 04/27/2021 0558   CREATININE 1.31 (H) 02/20/2012 0737   CALCIUM 8.3 (L) 04/27/2021 0558   CALCIUM 9.3 02/20/2012 0737   GFRNONAA 43 (L) 04/27/2021 0558   GFRNONAA 45 (L) 02/20/2012 0737   GFRAA 39 (L) 04/30/2020 1408   GFRAA 52 (L) 02/20/2012 0737   Assessment/Planning: The patient is a 65 year old female with extensive past medical history who was brought in via EMS for evaluation of bilateral lower extremity wounds, redness, swelling, and pain s/p right lower extremity angiogram with intervention (04/27/21)  1) Successful vascularization of the right lower extremity via angiogram. 2) Patient will also need the left lower extremity treated however even after given Versed and fentanyl for sedation the patient sobbed  and moved throughout the angiogram which made it rather difficult.  If we do move forward to treat the left lower extremity she will need general anesthesia and this will have to be scheduled at a time. 3) On Plavix and statin for medical management 4) Creatinine today 1.36 after initiation of contrast during angiogram.  AM BMP.  Discussed with Dr. Eber Hong Autumn Rennaker PA-C 04/28/2021 1:07 PM

## 2021-04-28 NOTE — Progress Notes (Signed)
OT Cancellation Note  Patient Details Name: [name] MRN: [id number] DOB: 1956/05/31   Cancelled Treatment:    Reason Eval/Treat Not Completed: Patient declined, no reason specified;Other (comment) (pt declined participation in any type of therapeutic task despite max encouragement. Will re-attempt as able.Shanon Payor, OTD OTR/L  04/28/21, 3:10 PM

## 2021-04-28 NOTE — Progress Notes (Signed)
PROGRESS NOTE    [name]  FKC:[id number] DOB: [birth year] DOA: 04/16/2021 PCP: Juline Patch, MD    Brief Narrative:  unfortunate 65 year old female with extensive past medical history who was brought in via EMS for evaluation of bilateral lower extremity redness swelling and pain.  Apparently patient has been in her wheelchair at home unable to move for several days.  Lower extremity significant cellulitic changes.  On broad-spectrum antibiotics.  Also some question of fracture in the feet on the right lower extremity.   Podiatry consulted on admission,Concern regarding vascular supply lower extremities.  Podiatry apparently reached out to vascular surgery for consultation prior to any surgical intervention.  Awaiting vascular evaluation to assess lower extremity vasculopathy. WOC following. ABI completed with results significant for severe bilateral peripheral arterial disease.    Angiogram delayed several times due to patient refusal and concerned about capacity.  Angiogram completed on 11/1.  Successful recanalization right lower extremity for limb salvage.  Pending podiatry follow-up     Assessment & Plan:  Sepsis Severe bilateral lower extremity cellulitis, nonpurulent Sepsis criteria met with tachycardia and leukocytosis.  Presumed source bilateral lower extremity nonpurulent cellulitis Concern regarding vascular supply Podiatry and vascular consulted MRSA screen negative 7/26 vancomycin and cefepime were discontinued.  Patient was started on Ancef for nonpurulent cellulitis 10/30-sensitivity to wound culture came back we will switch her antibiotics to cefepime as currently resistant to cefazolin. -Angiogram with successful recanalization right lower extremity for limb salvage on 11/1 Plan: Can continue cefepime for now Once podiatry follow-up obtained we can determine how long course of antibiotics she needs         Acute kidney injury on chronic kidney disease  stage IIIb ATN versus prerenal azotemia 10/31 creatinine mildly elevated at 1.51 11/1 improving again with ivf Continue to hold telmisartan Monitor renal function post angiogram Continue IVF for now             Hyperkalemia Improved and remained stable     Hypothyroidism Continue Synthroid     Type 2 diabetes mellitus with peripheral neuropathy Oral medications on hold BG overall stable Continue RISS Continue Neurontin   Essential hypertension Better controlled Continue Toprol, clonidine, amlodipine ARB on hold as above IV hydralazine     Morbid obesity BMI 74.94 This complicates overall care and prognosis Nutrition consult   Poor self-care TOC consult DSS evaluation pending  DVT prophylaxis: SQ Lovenox Code Status: Full Family Communication: None today Disposition Plan: Status is: Inpatient  Remains inpatient appropriate because: Postop day 1 status post angiography right lower extremity.  Successful recanalization.  Pending podiatry follow-up to determine surgical plan.       Level of care: Med-Surg  Consultants:  Vascular surgery Podiatry  Procedures:  Right lower extremity angiogram 11/1  Antimicrobials: Cefepime   Subjective: Patient seen and examined.  Resting comfortably in bed.  No visible distress.  Complains of mild pain near surgical site otherwise asymptomatic  Objective: Vitals:   04/28/21 0522 04/28/21 0734 04/28/21 1100 04/28/21 1128  BP: (!) 128/55 (!) 127/45 121/63 (!) 121/39  Pulse: (!) 52 (!) 51 (!) 52 (!) 52  Resp: 16 20    Temp: 97.9 F (36.6 C) 98.1 F (36.7 C) 98.9 F (37.2 C) 98.9 F (37.2 C)  TempSrc: Oral Oral Oral Oral  SpO2: 100% 99%  99%  Weight:      Height:        Intake/Output Summary (Last 24 hours) at 04/28/2021 1153 Last data filed at  04/28/2021 1022 Gross per 24 hour  Intake 825.67 ml  Output 350 ml  Net 475.67 ml   Filed Weights   04/16/21 2244 04/17/21 0403  Weight: 122.5 kg 118.5 kg     Examination:  General exam: No acute distress.  Appears chronically ill Respiratory system: Bibasilar crackles.  Normal work of breathing.  Room air Cardiovascular system: S1-S2, RRR, no murmurs Gastrointestinal system: Obese, NT/ND, normal bowel sounds Central nervous system: Alert and oriented. No focal neurological deficits. Extremities: Decreased power symmetrically.  Bilateral lower extremities and boots in surgical dressings Skin: Significant bilateral lower extremity cellulitis Psychiatry: Judgement and insight appear normal. Mood & affect appropriate.     Data Reviewed: I have personally reviewed following labs and imaging studies  CBC: Recent Labs  Lab 04/22/21 0727 04/23/21 0532 04/24/21 0430 04/26/21 0424  WBC 14.7* 13.2* 11.6* 10.1  NEUTROABS 10.9* 10.2* 8.5*  --   HGB 10.8* 11.4* 10.3* 10.3*  HCT 33.8* 33.7* 31.9* 32.7*  MCV 93.6 93.9 92.7 95.1  PLT 329 331 337 132   Basic Metabolic Panel: Recent Labs  Lab 04/22/21 0727 04/25/21 0414 04/26/21 0424 04/27/21 0558  NA 138 137 135 134*  K 4.5 4.0 4.0 4.1  CL 110 112* 110 108  CO2 19* 19* 21* 20*  GLUCOSE 220* 165* 199* 187*  BUN 26* $Remov'19 23 22  'wIqGYk$ CREATININE 1.37* 1.25* 1.51* 1.36*  CALCIUM 8.4* 7.7* 7.8* 8.3*   GFR: Estimated Creatinine Clearance: 51.7 mL/min (A) (by C-G formula based on SCr of 1.36 mg/dL (H)). Liver Function Tests: No results for input(s): AST, ALT, ALKPHOS, BILITOT, PROT, ALBUMIN in the last 168 hours. No results for input(s): LIPASE, AMYLASE in the last 168 hours. No results for input(s): AMMONIA in the last 168 hours. Coagulation Profile: No results for input(s): INR, PROTIME in the last 168 hours. Cardiac Enzymes: No results for input(s): CKTOTAL, CKMB, CKMBINDEX, TROPONINI in the last 168 hours. BNP (last 3 results) No results for input(s): PROBNP in the last 8760 hours. HbA1C: No results for input(s): HGBA1C in the last 72 hours. CBG: Recent Labs  Lab 04/27/21 1625  04/27/21 1720 04/27/21 2216 04/28/21 0731 04/28/21 1141  GLUCAP 84 88 180* 193* 177*   Lipid Profile: No results for input(s): CHOL, HDL, LDLCALC, TRIG, CHOLHDL, LDLDIRECT in the last 72 hours. Thyroid Function Tests: No results for input(s): TSH, T4TOTAL, FREET4, T3FREE, THYROIDAB in the last 72 hours. Anemia Panel: No results for input(s): VITAMINB12, FOLATE, FERRITIN, TIBC, IRON, RETICCTPCT in the last 72 hours. Sepsis Labs: No results for input(s): PROCALCITON, LATICACIDVEN in the last 168 hours.  Recent Results (from the past 240 hour(s))  Surgical pcr screen     Status: None   Collection Time: 04/19/21  1:09 PM   Specimen: Nasal Mucosa; Nasal Swab  Result Value Ref Range Status   MRSA, PCR NEGATIVE NEGATIVE Final   Staphylococcus aureus NEGATIVE NEGATIVE Final    Comment: (NOTE) The Xpert SA Assay (FDA approved for NASAL specimens in patients 49 years of age and older), is one component of a comprehensive surveillance program. It is not intended to diagnose infection nor to guide or monitor treatment. Performed at [facility], [address] [place], [place] [address]   Aerobic/Anaerobic Culture w Gram Stain (surgical/deep wound)     Status: None   Collection Time: 04/21/21  6:20 PM   Specimen: Wound  Result Value Ref Range Status   Specimen Description   Final    WOUND Performed at [facility], [address] [place], [place] [address]    Special Requests   Final    NONE Performed at [facility], [place], [place] [address]    Gram Stain NO WBC SEEN NO ORGANISMS SEEN   Final   Culture   Final    ABUNDANT PROTEUS VULGARIS FEW PREVOTELLA SPECIES BETA LACTAMASE POSITIVE Performed at [facility], [place] [address] [place], [place] [address]    Report Status 04/27/2021 FINAL  Final   Organism ID, Bacteria PROTEUS VULGARIS  Final      Susceptibility   Proteus vulgaris - MIC*    AMPICILLIN >=32 RESISTANT  Resistant     CEFAZOLIN >=64 RESISTANT Resistant     CEFEPIME <=0.12 SENSITIVE Sensitive     CEFTAZIDIME <=1 SENSITIVE Sensitive     CIPROFLOXACIN <=0.25 SENSITIVE Sensitive     GENTAMICIN <=1 SENSITIVE Sensitive     IMIPENEM 2 SENSITIVE Sensitive     TRIMETH/SULFA <=20 SENSITIVE Sensitive     AMPICILLIN/SULBACTAM 8 SENSITIVE Sensitive     PIP/TAZO <=4 SENSITIVE Sensitive     * ABUNDANT PROTEUS VULGARIS         Radiology Studies: PERIPHERAL VASCULAR CATHETERIZATION  Result Date: 04/27/2021 See surgical note for result.       Scheduled Meds:  acidophilus  2 capsule Oral Daily   amLODipine  5 mg Oral Daily   atorvastatin  20 mg Oral Daily   cloNIDine  0.3 mg Oral TID   clopidogrel  75 mg Oral Q breakfast   enoxaparin (LOVENOX) injection  0.5 mg/kg Subcutaneous Q24H   gabapentin  300 mg Oral BID   hydrocerin   Topical Daily   insulin aspart  0-15 Units Subcutaneous TID WC   insulin aspart  0-5 Units Subcutaneous QHS   levothyroxine  112 mcg Oral Q0600   metoprolol succinate  50 mg Oral Q breakfast   sodium chloride flush  3 mL Intravenous Q12H   Continuous Infusions:  sodium chloride Stopped (04/25/21 2330)   sodium chloride     ceFEPime (MAXIPIME) IV 2 g (04/28/21 1028)   metronidazole 500 mg (04/28/21 1136)     LOS: 11 days    Time spent: 35 minutes    Sidney Ace, MD [facility]   If 7PM-7AM, please contact night-coverage  04/28/2021, 11:53 AM

## 2021-04-28 NOTE — Progress Notes (Signed)
Physical Therapy Treatment Patient Details Name: [name] MRN: [id number] DOB: 05/09/1956 Today's Date: 04/28/2021   History of Present Illness 65 y.o. female with a past medical history of DM, HTN, hypothyroidism, kidney stones, lymphedema, morbid obesity and plantar fasciitis followed by podiatry outpatient for some chronic wounds and edema in the lower extremity who presents via EMS after a welfare check was made on the patient with neighbors concerned that patient has been stuck in her recliner for several days.  Patient states her feet have been hurting her for over a month got particularly bad over the last couple of days preventing her from standing or walking. Pt underwent R angioplasty and stent placement on 04/27/21.    PT Comments    Pt is making slow progress towards her therapy goals and currently limited by anxiety regarding movement and her current weight bearing precautions. During session, pt demonstrating poor understanding of WB precautions (listed below) and unable to progress to transfer due to inability to maintain precautions. Pt educated on techniques to improve her active participation in her care with scooting at EOB and up in bed. Continuing to recommend SNF at discharge to improve mobility and reduce caregiver burden. Will continue to work with patient during acute hospitalization to progress mobility.    Recommendations for follow up therapy are one component of a multi-disciplinary discharge planning process, led by the attending physician.  Recommendations may be updated based on patient status, additional functional criteria and insurance authorization.  Follow Up Recommendations  Skilled nursing-short term rehab (<3 hours/day)     Assistance Recommended at Discharge Frequent or constant Supervision/Assistance  Equipment Recommendations  Other (comment) (TBD next venue of care)    Recommendations for Other Services       Precautions / Restrictions  Precautions Precautions: Fall Restrictions Weight Bearing Restrictions: Yes RLE Weight Bearing: Partial weight bearing RLE Partial Weight Bearing Percentage or Pounds: Heel contact only with surgical shoe LLE Weight Bearing: Touchdown weight bearing Other Position/Activity Restrictions: Per podiatry: "For now can be partial weightbearing with heel contact to the right foot in surgical shoe.  Left foot she has a large heel ulceration so she cannot put weight on the left heel, patient could perhaps toe touch in a surgical shoe but believe this will be difficult to achieve due to body habitus and instability."     Mobility  Bed Mobility Overal bed mobility: Needs Assistance Bed Mobility: Supine to Sit;Sit to Supine     Supine to sit: Mod assist;HOB elevated Sit to supine: Mod assist;+2 for physical assistance   General bed mobility comments: Management of B LEs on and off bed. Pt demonstrating improvement in being able to scoot backwards and forwards at EOB for improve safety. Pt able to scoot up in bed in supine with usage of hand rails and encouragement    Transfers Overall transfer level: Needs assistance Equipment used: Rolling walker (2 wheels) Transfers: Sit to/from Stand Sit to Stand: Mod assist;+2 physical assistance           General transfer comment: ModA +2 for safety with usage of bariatric RW. Pt demonstrating difficulty maintaining WB precautions and requesting to sit due to dizziness    Ambulation/Gait             General Gait Details: Deferred   Stairs             Wheelchair Mobility    Modified Rankin (Stroke Patients Only)       Balance Overall balance  assessment: Needs assistance Sitting-balance support: Bilateral upper extremity supported;Feet supported Sitting balance-Leahy Scale: Fair     Standing balance support: Reliant on assistive device for balance;Bilateral upper extremity supported Standing balance-Leahy Scale:  Poor Standing balance comment: Unable to maintain standing position with assistance and requesting to sit due to dizziness                            Cognition Arousal/Alertness: Awake/alert Behavior During Therapy: Anxious Overall Cognitive Status: Within Functional Limits for tasks assessed                                 General Comments: requiring maximal encouragement to participate, fear of falling        Exercises Total Joint Exercises Ankle Circles/Pumps: AROM;Both;10 reps;Supine Long Arc Quad: AROM;Strengthening;Both;10 reps;Seated Other Exercises Other Exercises: Pt educated heavily on her WB precautions and techniques to improve ability to scoot at EOB and supine    General Comments        Pertinent Vitals/Pain Pain Assessment: No/denies pain    Home Living                          Prior Function            PT Goals (current goals can now be found in the care plan section) Acute Rehab PT Goals Patient Stated Goal: to go home PT Goal Formulation: With patient Time For Goal Achievement: 05/04/21 Potential to Achieve Goals: Poor Progress towards PT goals: Progressing toward goals    Frequency    Min 2X/week      PT Plan Current plan remains appropriate    Co-evaluation              AM-PAC PT "6 Clicks" Mobility   Outcome Measure  Help needed turning from your back to your side while in a flat bed without using bedrails?: A Lot Help needed moving from lying on your back to sitting on the side of a flat bed without using bedrails?: A Lot Help needed moving to and from a bed to a chair (including a wheelchair)?: Total Help needed standing up from a chair using your arms (e.g., wheelchair or bedside chair)?: Total Help needed to walk in hospital room?: Total Help needed climbing 3-5 steps with a railing? : Total 6 Click Score: 8    End of Session Equipment Utilized During Treatment: Gait belt;Other  (comment) (R post op shoe) Activity Tolerance: Patient limited by fatigue Patient left: in bed;with call bell/phone within reach;with bed alarm set;Other (comment) (PRAFOs donning on B LE) Nurse Communication: Mobility status PT Visit Diagnosis: Muscle weakness (generalized) (M62.81);Difficulty in walking, not elsewhere classified (R26.2);Pain     Time: 1287-8676 PT Time Calculation (min) (ACUTE ONLY): 33 min  Charges:  $Therapeutic Exercise: 8-22 mins $Therapeutic Activity: 8-22 mins                     Andrey Campanile, SPT    Andrey Campanile 04/28/2021, 12:26 PM

## 2021-04-29 ENCOUNTER — Other Ambulatory Visit (INDEPENDENT_AMBULATORY_CARE_PROVIDER_SITE_OTHER): Payer: Self-pay | Admitting: Vascular Surgery

## 2021-04-29 DIAGNOSIS — F4322 Adjustment disorder with anxiety: Secondary | ICD-10-CM | POA: Diagnosis not present

## 2021-04-29 DIAGNOSIS — L97929 Non-pressure chronic ulcer of unspecified part of left lower leg with unspecified severity: Secondary | ICD-10-CM | POA: Diagnosis not present

## 2021-04-29 LAB — GLUCOSE, CAPILLARY
Glucose-Capillary: 181 mg/dL — ABNORMAL HIGH (ref 70–99)
Glucose-Capillary: 207 mg/dL — ABNORMAL HIGH (ref 70–99)
Glucose-Capillary: 130 mg/dL — ABNORMAL HIGH (ref 70–99)
Glucose-Capillary: 190 mg/dL — ABNORMAL HIGH (ref 70–99)

## 2021-04-29 LAB — CBC WITH DIFFERENTIAL/PLATELET
Abs Immature Granulocytes: 0.07 10*3/uL (ref 0.00–0.07)
Basophils Absolute: 0.1 10*3/uL (ref 0.0–0.1)
Basophils Relative: 1 %
Eosinophils Absolute: 0.5 10*3/uL (ref 0.0–0.5)
Eosinophils Relative: 6 %
HCT: 32.4 % — ABNORMAL LOW (ref 36.0–46.0)
Hemoglobin: 9.9 g/dL — ABNORMAL LOW (ref 12.0–15.0)
Immature Granulocytes: 1 %
Lymphocytes Relative: 13 %
Lymphs Abs: 1.2 10*3/uL (ref 0.7–4.0)
MCH: 30.1 pg (ref 26.0–34.0)
MCHC: 30.6 g/dL (ref 30.0–36.0)
MCV: 98.5 fL (ref 80.0–100.0)
Monocytes Absolute: 1 10*3/uL (ref 0.1–1.0)
Monocytes Relative: 11 %
Neutro Abs: 6.2 10*3/uL (ref 1.7–7.7)
Neutrophils Relative %: 68 %
Platelets: 301 10*3/uL (ref 150–400)
RBC: 3.29 MIL/uL — ABNORMAL LOW (ref 3.87–5.11)
RDW: 18.3 % — ABNORMAL HIGH (ref 11.5–15.5)
WBC: 9 10*3/uL (ref 4.0–10.5)
nRBC: 0 % (ref 0.0–0.2)

## 2021-04-29 LAB — BASIC METABOLIC PANEL
Anion gap: 6 (ref 5–15)
BUN: 20 mg/dL (ref 8–23)
CO2: 20 mmol/L — ABNORMAL LOW (ref 22–32)
Calcium: 8.2 mg/dL — ABNORMAL LOW (ref 8.9–10.3)
Chloride: 111 mmol/L (ref 98–111)
Creatinine, Ser: 1.4 mg/dL — ABNORMAL HIGH (ref 0.44–1.00)
GFR, Estimated: 41 mL/min — ABNORMAL LOW (ref >60)
Glucose, Bld: 206 mg/dL — ABNORMAL HIGH (ref 70–99)
Potassium: 4.3 mmol/L (ref 3.5–5.1)
Sodium: 137 mmol/L (ref 135–145)

## 2021-04-29 MED ORDER — MECLIZINE HCL 25 MG PO TABS
12.5000 mg | ORAL_TABLET | Freq: Three times a day (TID) | ORAL | Status: DC | PRN
  Administered 2021-04-29 (×2): 12.5 mg via ORAL
  Filled 2021-04-29 (×4): qty 0.5

## 2021-04-29 MED ORDER — SODIUM CHLORIDE 0.9 % IV SOLN: INTRAVENOUS | Status: DC

## 2021-04-29 MED ORDER — CEFAZOLIN SODIUM-DEXTROSE 2-4 GM/100ML-% IV SOLN
2.0000 g | INTRAVENOUS | Status: AC
  Administered 2021-04-30: 2 g via INTRAVENOUS

## 2021-04-29 NOTE — Progress Notes (Signed)
PROGRESS NOTE    [name]  XYN:[id number] DOB: February 25, 1956 DOA: 04/16/2021 PCP: Duanne Limerick, MD    Brief Narrative:  unfortunate 65 year old female with extensive past medical history who was brought in via EMS for evaluation of bilateral lower extremity redness swelling and pain.  Apparently patient has been in her wheelchair at home unable to move for several days.  Lower extremity significant cellulitic changes.  On broad-spectrum antibiotics.  Also some question of fracture in the feet on the right lower extremity.   Podiatry consulted on admission,Concern regarding vascular supply lower extremities.  Podiatry apparently reached out to vascular surgery for consultation prior to any surgical intervention.  Awaiting vascular evaluation to assess lower extremity vasculopathy. WOC following. ABI completed with results significant for severe bilateral peripheral arterial disease.    Angiogram delayed several times due to patient refusal and concerned about capacity.  Angiogram completed on 11/1.  Successful recanalization right lower extremity for limb salvage.  Podiatry is deferring surgical intervention until we have angiography and revascularization attempt on both lower extremities.  Now the right is been completed the patient is apparently refusing left lower extremity angiogram.  Vascular surgery is offered general anesthesia but at this time the patient continues to refuse.     Assessment & Plan:  Sepsis Severe bilateral lower extremity cellulitis, nonpurulent Sepsis criteria met with tachycardia and leukocytosis.  Presumed source bilateral lower extremity nonpurulent cellulitis Concern regarding vascular supply Podiatry and vascular consulted MRSA screen negative 7/26 vancomycin and cefepime were discontinued.  Patient was started on Ancef for nonpurulent cellulitis 10/30-sensitivity to wound culture came back we will switch her antibiotics to cefepime as currently  resistant to cefazolin. -Angiogram with successful recanalization right lower extremity for limb salvage on 11/1 -Needs angio of left lower extremity however currently patient is refusing despite for vascular for general anesthesia Plan: Continue IV cefepime for now.  We will revisit issue of angiogram with patient.  Tentatively on schedule for 11/4   Acute kidney injury on chronic kidney disease stage IIIb ATN versus prerenal azotemia 10/31 creatinine mildly elevated at 1.51 11/1 improving again with ivf Continue to hold telmisartan Monitor renal function post angiogram   Hyperkalemia Improved and remained stable   Hypothyroidism Continue Synthroid   Type 2 diabetes mellitus with peripheral neuropathy Oral medications on hold BG overall stable Continue RISS Continue Neurontin   Essential hypertension Better controlled Continue Toprol, clonidine, amlodipine ARB on hold as above IV hydralazine   Morbid obesity BMI 43.47 This complicates overall care and prognosis Nutrition consult   Poor self-care TOC consult DSS evaluation pending  DVT prophylaxis: SQ Lovenox Code Status: Full Family Communication: None today Disposition Plan: Status is: Inpatient  Remains inpatient appropriate because: Needs left lower extremity angiogram prior to any surgical intervention from podiatry.  Currently patient is refusing.  We will revisit this issue with patient in a.m.  Disposition plan unclear at this time.       Level of care: Med-Surg  Consultants:  Vascular surgery Podiatry  Procedures:  Right lower extremity angiogram 11/1  Antimicrobials: Cefepime   Subjective: Patient seen and examined.  Resting in bed.  No visible distress.  Pain in your surgical site endorsed  Objective: Vitals:   04/28/21 1527 04/28/21 2020 04/29/21 0308 04/29/21 0743  BP: (!) 134/55 (!) 125/49 (!) 128/55 (!) 150/61  Pulse: (!) 51 (!) 51 (!) 55 (!) 51  Resp: 16 18 18 16   Temp: 97.7 F  (36.5 C) 98.8 F (  37.1 C) 98.1 F (36.7 C) 97.8 F (36.6 C)  TempSrc: Oral Oral Oral Oral  SpO2: 99% 97% 97% 100%  Weight:      Height:        Intake/Output Summary (Last 24 hours) at 04/29/2021 1039 Last data filed at 04/29/2021 1031 Gross per 24 hour  Intake 720 ml  Output --  Net 720 ml   Filed Weights   04/16/21 2244 04/17/21 0403  Weight: 122.5 kg 118.5 kg    Examination:  General exam: No acute distress.  Chronically ill Respiratory system: Poor respiratory effort.  Bibasilar crackles.  Normal work of breathing.  Room air Cardiovascular system: S1-S2, RRR, no murmurs Gastrointestinal system: Obese, NT/ND, normal bowel sounds Central nervous system: Alert and oriented. No focal neurological deficits. Extremities: Decreased power symmetrically.  Bilateral lower extremities and boots in surgical dressings Skin: Significant bilateral lower extremity cellulitis Psychiatry: Judgement and insight appear normal. Mood & affect appropriate.     Data Reviewed: I have personally reviewed following labs and imaging studies  CBC: Recent Labs  Lab 04/23/21 0532 04/24/21 0430 04/26/21 0424 04/29/21 0749  WBC 13.2* 11.6* 10.1 9.0  NEUTROABS 10.2* 8.5*  --  6.2  HGB 11.4* 10.3* 10.3* 9.9*  HCT 33.7* 31.9* 32.7* 32.4*  MCV 93.9 92.7 95.1 98.5  PLT 331 337 345 194   Basic Metabolic Panel: Recent Labs  Lab 04/25/21 0414 04/26/21 0424 04/27/21 0558 04/29/21 0749  NA 137 135 134* 137  K 4.0 4.0 4.1 4.3  CL 112* 110 108 111  CO2 19* 21* 20* 20*  GLUCOSE 165* 199* 187* 206*  BUN $Re'19 23 22 20  'KvB$ CREATININE 1.25* 1.51* 1.36* 1.40*  CALCIUM 7.7* 7.8* 8.3* 8.2*   GFR: Estimated Creatinine Clearance: 50.2 mL/min (A) (by C-G formula based on SCr of 1.4 mg/dL (H)). Liver Function Tests: No results for input(s): AST, ALT, ALKPHOS, BILITOT, PROT, ALBUMIN in the last 168 hours. No results for input(s): LIPASE, AMYLASE in the last 168 hours. No results for input(s): AMMONIA in  the last 168 hours. Coagulation Profile: No results for input(s): INR, PROTIME in the last 168 hours. Cardiac Enzymes: No results for input(s): CKTOTAL, CKMB, CKMBINDEX, TROPONINI in the last 168 hours. BNP (last 3 results) No results for input(s): PROBNP in the last 8760 hours. HbA1C: No results for input(s): HGBA1C in the last 72 hours. CBG: Recent Labs  Lab 04/28/21 0731 04/28/21 1141 04/28/21 1637 04/28/21 2116 04/29/21 0742  GLUCAP 193* 177* 180* 189* 181*   Lipid Profile: No results for input(s): CHOL, HDL, LDLCALC, TRIG, CHOLHDL, LDLDIRECT in the last 72 hours. Thyroid Function Tests: No results for input(s): TSH, T4TOTAL, FREET4, T3FREE, THYROIDAB in the last 72 hours. Anemia Panel: No results for input(s): VITAMINB12, FOLATE, FERRITIN, TIBC, IRON, RETICCTPCT in the last 72 hours. Sepsis Labs: No results for input(s): PROCALCITON, LATICACIDVEN in the last 168 hours.  Recent Results (from the past 240 hour(s))  Surgical pcr screen     Status: None   Collection Time: 04/19/21  1:09 PM   Specimen: Nasal Mucosa; Nasal Swab  Result Value Ref Range Status   MRSA, PCR NEGATIVE NEGATIVE Final   Staphylococcus aureus NEGATIVE NEGATIVE Final    Comment: (NOTE) The Xpert SA Assay (FDA approved for NASAL specimens in patients 36 years of age and older), is one component of a comprehensive surveillance program. It is not intended to diagnose infection nor to guide or monitor treatment. Performed at [facility], [place],  [place], [place] [address]   Aerobic/Anaerobic Culture w Gram Stain (surgical/deep wound)     Status: None   Collection Time: 04/21/21  6:20 PM   Specimen: Wound  Result Value Ref Range Status   Specimen Description   Final    WOUND Performed at [facility], [address] [place], [place] [address]    Special Requests   Final    NONE Performed at [facility], [address] [place], [place] [address]    Gram  Stain NO WBC SEEN NO ORGANISMS SEEN   Final   Culture   Final    ABUNDANT PROTEUS VULGARIS FEW PREVOTELLA SPECIES BETA LACTAMASE POSITIVE Performed at [facility], [place] [address] [place], [place] [address]    Report Status 04/27/2021 FINAL  Final   Organism ID, Bacteria PROTEUS VULGARIS  Final      Susceptibility   Proteus vulgaris - MIC*    AMPICILLIN >=32 RESISTANT Resistant     CEFAZOLIN >=64 RESISTANT Resistant     CEFEPIME <=0.12 SENSITIVE Sensitive     CEFTAZIDIME <=1 SENSITIVE Sensitive     CIPROFLOXACIN <=0.25 SENSITIVE Sensitive     GENTAMICIN <=1 SENSITIVE Sensitive     IMIPENEM 2 SENSITIVE Sensitive     TRIMETH/SULFA <=20 SENSITIVE Sensitive     AMPICILLIN/SULBACTAM 8 SENSITIVE Sensitive     PIP/TAZO <=4 SENSITIVE Sensitive     * ABUNDANT PROTEUS VULGARIS         Radiology Studies: PERIPHERAL VASCULAR CATHETERIZATION  Result Date: 04/27/2021 See surgical note for result.       Scheduled Meds:  acidophilus  2 capsule Oral Daily   amLODipine  5 mg Oral Daily   atorvastatin  20 mg Oral Daily   cloNIDine  0.3 mg Oral TID   clopidogrel  75 mg Oral Q breakfast   enoxaparin (LOVENOX) injection  0.5 mg/kg Subcutaneous Q24H   gabapentin  300 mg Oral BID   hydrocerin   Topical Daily   insulin aspart  0-15 Units Subcutaneous TID WC   insulin aspart  0-5 Units Subcutaneous QHS   levothyroxine  112 mcg Oral Q0600   metoprolol succinate  50 mg Oral Q breakfast   sodium chloride flush  3 mL Intravenous Q12H   Continuous Infusions:  sodium chloride Stopped (04/25/21 2330)   sodium chloride     [START ON 04/30/2021] sodium chloride     ceFEPime (MAXIPIME) IV 2 g (04/28/21 2226)   metronidazole 500 mg (04/28/21 2344)     LOS: 12 days    Time spent: 25 minutes    Sidney Ace, MD [facility]   If 7PM-7AM, please contact night-coverage  04/29/2021, 10:39 AM

## 2021-04-29 NOTE — Progress Notes (Signed)
[facility] Daily Progress Note  04/28/21:             1.  Introduction catheter into right lower extremity 3rd order catheter placement              2.    Contrast injection right lower extremity for distal runoff             3.  Percutaneous transluminal angioplasty and stent placement right superficial femoral artery and popliteal             4.  Star close closure left common femoral arteriotomy  Subjective: Patient without complaint this AM.  No acute issues overnight.  Objective: Vitals:   04/28/21 1527 04/28/21 2020 04/29/21 0308 04/29/21 0743  BP: (!) 134/55 (!) 125/49 (!) 128/55 (!) 150/61  Pulse: (!) 51 (!) 51 (!) 55 (!) 51  Resp: 16 18 18 16   Temp: 97.7 F (36.5 C) 98.8 F (37.1 C) 98.1 F (36.7 C) 97.8 F (36.6 C)  TempSrc: Oral Oral Oral Oral  SpO2: 99% 97% 97% 100%  Weight:      Height:        Intake/Output Summary (Last 24 hours) at 04/29/2021 1011 Last data filed at 04/28/2021 1800 Gross per 24 hour  Intake 720 ml  Output --  Net 720 ml   Physical Exam: A&Ox3, NAD CV: RRR Pulmonary: CTA Bilaterally Abdomen: Soft, Non-tender, Non-distended Left groin access site:             PAD removed.  No hematoma or ecchymosis noted.  No drainage. Vascular:             Right lower extremity: Thigh soft. Calf soft. Extremities warm distally. Unable to palpate pedal pulses. Wounds are stable.             Left lower extremity: Thigh soft. Calf soft. Extremities warm distally. Unable to palpate pedal pulses. Wounds are stable.   Laboratory: CBC    Component Value Date/Time   WBC 9.0 04/29/2021 0749   HGB 9.9 (L) 04/29/2021 0749   HGB 12.5 02/20/2012 0737   HCT 32.4 (L) 04/29/2021 0749   HCT 37.6 02/20/2012 0737   PLT 301 04/29/2021 0749   PLT 230 02/20/2012 0737   BMET    Component Value Date/Time   NA 137 04/29/2021 0749   NA 137 10/22/2020 1450   NA 139 02/20/2012 0737   K 4.3 04/29/2021 0749   K 4.7 02/20/2012 0737   CL 111  04/29/2021 0749   CL 103 02/20/2012 0737   CO2 20 (L) 04/29/2021 0749   CO2 27 02/20/2012 0737   GLUCOSE 206 (H) 04/29/2021 0749   GLUCOSE 123 (H) 02/20/2012 0737   BUN 20 04/29/2021 0749   BUN 27 10/22/2020 1450   BUN 26 (H) 02/20/2012 0737   CREATININE 1.40 (H) 04/29/2021 0749   CREATININE 1.31 (H) 02/20/2012 0737   CALCIUM 8.2 (L) 04/29/2021 0749   CALCIUM 9.3 02/20/2012 0737   GFRNONAA 41 (L) 04/29/2021 0749   GFRNONAA 45 (L) 02/20/2012 0737   GFRAA 39 (L) 04/30/2020 1408   GFRAA 52 (L) 02/20/2012 0737   Assessment/Planning: The patient is a 65 year old female with extensive past medical history who was brought in via EMS for evaluation of bilateral lower extremity wounds, redness, swelling, and pain s/p right lower extremity angiogram with intervention (04/27/21)   1) Successful vascularization of the right lower extremity via angiogram.  2) Patient will also need the left  lower extremity treated however even after given Versed and fentanyl for sedation the patient sobbed and moved throughout the angiogram which made it rather difficult.  We would like to place the patient on our schedule tomorrow for a left lower extremity angiogram with possible intervention using general anesthesia.  I had a long discussion with the patient about this.  Currently, she is refusing.  I will go ahead and place her on the schedule and schedule anesthesia in case she does change her mind.  I will also make her n.p.o. after midnight just in case.  3) On Plavix and statin for medical management   Discussed with Dr. Eber Hong Mandeep Ferch PA-C 04/29/2021 10:11 AM

## 2021-04-29 NOTE — H&P (View-Only) (Signed)
[facility] Daily Progress Note  04/28/21:             1.  Introduction catheter into right lower extremity 3rd order catheter placement              2.    Contrast injection right lower extremity for distal runoff             3.  Percutaneous transluminal angioplasty and stent placement right superficial femoral artery and popliteal             4.  Star close closure left common femoral arteriotomy  Subjective: Patient without complaint this AM.  No acute issues overnight.  Objective: Vitals:   04/28/21 1527 04/28/21 2020 04/29/21 0308 04/29/21 0743  BP: (!) 134/55 (!) 125/49 (!) 128/55 (!) 150/61  Pulse: (!) 51 (!) 51 (!) 55 (!) 51  Resp: 16 18 18 16   Temp: 97.7 F (36.5 C) 98.8 F (37.1 C) 98.1 F (36.7 C) 97.8 F (36.6 C)  TempSrc: Oral Oral Oral Oral  SpO2: 99% 97% 97% 100%  Weight:      Height:        Intake/Output Summary (Last 24 hours) at 04/29/2021 1011 Last data filed at 04/28/2021 1800 Gross per 24 hour  Intake 720 ml  Output --  Net 720 ml   Physical Exam: A&Ox3, NAD CV: RRR Pulmonary: CTA Bilaterally Abdomen: Soft, Non-tender, Non-distended Left groin access site:             PAD removed.  No hematoma or ecchymosis noted.  No drainage. Vascular:             Right lower extremity: Thigh soft. Calf soft. Extremities warm distally. Unable to palpate pedal pulses. Wounds are stable.             Left lower extremity: Thigh soft. Calf soft. Extremities warm distally. Unable to palpate pedal pulses. Wounds are stable.   Laboratory: CBC    Component Value Date/Time   WBC 9.0 04/29/2021 0749   HGB 9.9 (L) 04/29/2021 0749   HGB 12.5 02/20/2012 0737   HCT 32.4 (L) 04/29/2021 0749   HCT 37.6 02/20/2012 0737   PLT 301 04/29/2021 0749   PLT 230 02/20/2012 0737   BMET    Component Value Date/Time   NA 137 04/29/2021 0749   NA 137 10/22/2020 1450   NA 139 02/20/2012 0737   K 4.3 04/29/2021 0749   K 4.7 02/20/2012 0737   CL 111  04/29/2021 0749   CL 103 02/20/2012 0737   CO2 20 (L) 04/29/2021 0749   CO2 27 02/20/2012 0737   GLUCOSE 206 (H) 04/29/2021 0749   GLUCOSE 123 (H) 02/20/2012 0737   BUN 20 04/29/2021 0749   BUN 27 10/22/2020 1450   BUN 26 (H) 02/20/2012 0737   CREATININE 1.40 (H) 04/29/2021 0749   CREATININE 1.31 (H) 02/20/2012 0737   CALCIUM 8.2 (L) 04/29/2021 0749   CALCIUM 9.3 02/20/2012 0737   GFRNONAA 41 (L) 04/29/2021 0749   GFRNONAA 45 (L) 02/20/2012 0737   GFRAA 39 (L) 04/30/2020 1408   GFRAA 52 (L) 02/20/2012 0737   Assessment/Planning: The patient is a 65 year old female with extensive past medical history who was brought in via EMS for evaluation of bilateral lower extremity wounds, redness, swelling, and pain s/p right lower extremity angiogram with intervention (04/27/21)   1) Successful vascularization of the right lower extremity via angiogram.  2) Patient will also need the left  lower extremity treated however even after given Versed and fentanyl for sedation the patient sobbed and moved throughout the angiogram which made it rather difficult.  We would like to place the patient on our schedule tomorrow for a left lower extremity angiogram with possible intervention using general anesthesia.  I had a long discussion with the patient about this.  Currently, she is refusing.  I will go ahead and place her on the schedule and schedule anesthesia in case she does change her mind.  I will also make her n.p.o. after midnight just in case.  3) On Plavix and statin for medical management   Discussed with Dr. Eber Hong Tabbatha Bordelon PA-C 04/29/2021 10:11 AM

## 2021-04-29 NOTE — Progress Notes (Signed)
Occupational Therapy Treatment Patient Details Name: [name] MRN: [id number] DOB: 04/19/1956 Today's Date: 04/29/2021   History of present illness 65 y.o. female with a past medical history of DM, HTN, hypothyroidism, kidney stones, lymphedema, morbid obesity and plantar fasciitis followed by podiatry outpatient for some chronic wounds and edema in the lower extremity who presents via EMS after a welfare check was made on the patient with neighbors concerned that patient has been stuck in her recliner for several days.  Patient states her feet have been hurting her for over a month got particularly bad over the last couple of days preventing her from standing or walking. Pt underwent R angioplasty and stent placement on 04/27/21.   OT comments  Pt seen for OT tx this date. Pt perseverating on dizziness and learning why she is dizzy. Active listening provided. Secure chat sent to RN to address. Pt required MAX VC to redirect and attend to task. Pt instructed in BUE exercises. Pt required MAX encouragement to perform and cues to redirect as she kept waving her arms around stating "I can do that just fine" but then would not follow instructions for repetitions. Facilitated problem solving for when she can perform BUE there ex to support her recovery and pt agreeable to during commercial breaks. Pt continues to benefit from skilled OT Services. Continue to recommend SNF at pt is unsafe/unable to return home safety at this time due to deficits and functional impairments.    Recommendations for follow up therapy are one component of a multi-disciplinary discharge planning process, led by the attending physician.  Recommendations may be updated based on patient status, additional functional criteria and insurance authorization.    Follow Up Recommendations  Skilled nursing-short term rehab (<3 hours/day)    Assistance Recommended at Discharge Frequent or constant Supervision/Assistance  Equipment  Recommendations  BSC (BARI DROP ARM)    Recommendations for Other Services      Precautions / Restrictions Precautions Precautions: Fall Restrictions Weight Bearing Restrictions: Yes RLE Weight Bearing: Partial weight bearing RLE Partial Weight Bearing Percentage or Pounds: heel contact only with surgical shoe LLE Weight Bearing: Partial weight bearing Other Position/Activity Restrictions: Per podiatry: "For now can be partial weightbearing with heel contact to the right foot in surgical shoe.  Left foot she has a large heel ulceration so she cannot put weight on the left heel, patient could perhaps toe touch in a surgical shoe but believe this will be difficult to achieve due to body habitus and instability."       Mobility Bed Mobility               General bed mobility comments: deferred 2/2 significant dizziness and HR 52, RN notified    Transfers                   General transfer comment: deferred 2/2 significant dizziness and HR 52, RN notified     Balance                                           ADL either performed or assessed with clinical judgement   ADL Overall ADL's : Needs assistance/impaired     Grooming: Bed level;Wash/dry face;Set up Grooming Details (indicate cue type and reason): pt declined EOB 2/2 dizziness, set up with washcloth and able to perform long sitting in bed  Vision       Perception     Praxis      Cognition Arousal/Alertness: Awake/alert Behavior During Therapy: Anxious Overall Cognitive Status: No family/caregiver present to determine baseline cognitive functioning                                 General Comments: pt very talkative, requires MAX VC to redirect, very focused on why she is dizzy. Active listening utilized and RN secure chat sent during session to address.          Exercises Other Exercises Other Exercises: Pt  instructed in BUE exercises. Pt required MAX encouragement to perform and cues to redirect as she kept waving her arms around stating "I can do that just fine" but then would not follow instructions for repetitions.   Shoulder Instructions       General Comments prevalon boots adjusted for more optimal LE positioning/joint alignment    Pertinent Vitals/ Pain          Home Living                                          Prior Functioning/Environment              Frequency  Min 1X/week        Progress Toward Goals  OT Goals(current goals can now be found in the care plan section)  Progress towards OT goals: OT to reassess next treatment  Acute Rehab OT Goals Patient Stated Goal: get stronger and stand again OT Goal Formulation: With patient Time For Goal Achievement: 05/04/21 Potential to Achieve Goals: [place] A[place]as Discharge plan remains appropriate;Frequency remains appropriate    Co-evaluation                 AM-PAC OT "6 Clicks" Daily Activity     Outcome Measure   Help from another person eating meals?: None Help from another person taking care of personal grooming?: A Little Help from another person toileting, which includes using toliet, bedpan, or urinal?: Total Help from another person bathing (including washing, rinsing, drying)?: A Lot Help from another person to put on and taking off regular upper body clothing?: A Little Help from another person to put on and taking off regular lower body clothing?: A Lot 6 Click Score: 15    End of Session    OT Visit Diagnosis: Unsteadiness on feet (R26.81);Muscle weakness (generalized) (M62.81);Pain Pain - Right/Left: Left Pain - part of body: Leg   Activity Tolerance Treatment limited secondary to medical complications (Comment) (dizziness)   Patient Left in bed;with call bell/phone within reach;with bed alarm set;Other (comment) (prevalon boots on B feet)   Nurse Communication  Other (comment) (dizziness)        Time: 5498-2641 OT Time Calculation (min): 15 min  Charges: OT General Charges $OT Visit: 1 Visit OT Treatments $Therapeutic Exercise: 8-22 mins  Ardeth Perfect., MPH, MS, OTR/L ascom [phone] 04/29/21, 11:37 AM

## 2021-04-29 NOTE — Progress Notes (Signed)
PT Cancellation Note  Patient Details Name: [name] MRN: [id number] DOB: 09/16/1955   Cancelled Treatment:    Reason Eval/Treat Not Completed: Other (comment). Pt supine in bed upon entry with bil PRAFO boots donned. Pt currently declining participation with therapy treatment today due to c/o dizziness. Pt denies hx of inner ear/vertigo issues. PT explained that dizziness could be due to minimal mobility and decreased upright tolerance, and that participation with therapy could help address these issues. Pt verbalized understanding but continued to decline participation, going on to say that she was dizzy last night and no one can tell her why. RN notified of pt complaints. Will follow up with therapy treatment tomorrow as appropriate.    Herminio Commons, PT, DPT 10:54 AM,04/29/21

## 2021-04-30 ENCOUNTER — Encounter
Admission: EM | Disposition: A | Discharge status: Skilled Nursing Facility | Payer: Self-pay | Source: Home / Self Care | Attending: Internal Medicine

## 2021-04-30 ENCOUNTER — Inpatient Hospital Stay: Payer: Medicare HMO | Admitting: Anesthesiology

## 2021-04-30 DIAGNOSIS — I70245 Atherosclerosis of native arteries of left leg with ulceration of other part of foot: Secondary | ICD-10-CM

## 2021-04-30 DIAGNOSIS — F4322 Adjustment disorder with anxiety: Secondary | ICD-10-CM | POA: Diagnosis not present

## 2021-04-30 DIAGNOSIS — I70248 Atherosclerosis of native arteries of left leg with ulceration of other part of lower left leg: Secondary | ICD-10-CM

## 2021-04-30 DIAGNOSIS — L97519 Non-pressure chronic ulcer of other part of right foot with unspecified severity: Secondary | ICD-10-CM

## 2021-04-30 DIAGNOSIS — L97529 Non-pressure chronic ulcer of other part of left foot with unspecified severity: Secondary | ICD-10-CM

## 2021-04-30 DIAGNOSIS — L97829 Non-pressure chronic ulcer of other part of left lower leg with unspecified severity: Secondary | ICD-10-CM

## 2021-04-30 DIAGNOSIS — L97819 Non-pressure chronic ulcer of other part of right lower leg with unspecified severity: Secondary | ICD-10-CM

## 2021-04-30 DIAGNOSIS — I70238 Atherosclerosis of native arteries of right leg with ulceration of other part of lower right leg: Secondary | ICD-10-CM

## 2021-04-30 DIAGNOSIS — I70235 Atherosclerosis of native arteries of right leg with ulceration of other part of foot: Secondary | ICD-10-CM

## 2021-04-30 HISTORY — PX: LOWER EXTREMITY ANGIOGRAPHY: CATH118251

## 2021-04-30 LAB — BASIC METABOLIC PANEL
Anion gap: 6 (ref 5–15)
BUN: 21 mg/dL (ref 8–23)
CO2: 19 mmol/L — ABNORMAL LOW (ref 22–32)
Calcium: 8.2 mg/dL — ABNORMAL LOW (ref 8.9–10.3)
Chloride: 112 mmol/L — ABNORMAL HIGH (ref 98–111)
Creatinine, Ser: 1.33 mg/dL — ABNORMAL HIGH (ref 0.44–1.00)
GFR, Estimated: 44 mL/min — ABNORMAL LOW (ref >60)
Glucose, Bld: 175 mg/dL — ABNORMAL HIGH (ref 70–99)
Potassium: 4.2 mmol/L (ref 3.5–5.1)
Sodium: 137 mmol/L (ref 135–145)

## 2021-04-30 LAB — GLUCOSE, CAPILLARY
Glucose-Capillary: 163 mg/dL — ABNORMAL HIGH (ref 70–99)
Glucose-Capillary: 154 mg/dL — ABNORMAL HIGH (ref 70–99)
Glucose-Capillary: 174 mg/dL — ABNORMAL HIGH (ref 70–99)
Glucose-Capillary: 177 mg/dL — ABNORMAL HIGH (ref 70–99)
Glucose-Capillary: 209 mg/dL — ABNORMAL HIGH (ref 70–99)

## 2021-04-30 SURGERY — LOWER EXTREMITY ANGIOGRAPHY
Anesthesia: General | Laterality: Left

## 2021-04-30 MED ORDER — DIPHENHYDRAMINE HCL 50 MG/ML IJ SOLN
INTRAMUSCULAR | Status: AC
  Filled 2021-04-30: qty 1

## 2021-04-30 MED ORDER — ONDANSETRON HCL 4 MG/2ML IJ SOLN
INTRAMUSCULAR | Status: DC | PRN
  Administered 2021-04-30: 4 mg via INTRAVENOUS

## 2021-04-30 MED ORDER — PROPOFOL 500 MG/50ML IV EMUL
INTRAVENOUS | Status: DC | PRN
  Administered 2021-04-30: 100 ug/kg/min via INTRAVENOUS

## 2021-04-30 MED ORDER — ONDANSETRON HCL 4 MG/2ML IJ SOLN: 4.0000 mg | Freq: Four times a day (QID) | INTRAMUSCULAR | Status: DC | PRN

## 2021-04-30 MED ORDER — HEPARIN SODIUM (PORCINE) 1000 UNIT/ML IJ SOLN
INTRAMUSCULAR | Status: AC
  Filled 2021-04-30: qty 1

## 2021-04-30 MED ORDER — FENTANYL CITRATE (PF) 100 MCG/2ML IJ SOLN
INTRAMUSCULAR | Status: DC | PRN
  Administered 2021-04-30: 25 ug via INTRAVENOUS

## 2021-04-30 MED ORDER — PROPOFOL 500 MG/50ML IV EMUL
INTRAVENOUS | Status: AC
  Filled 2021-04-30: qty 50

## 2021-04-30 MED ORDER — HEPARIN SODIUM (PORCINE) 1000 UNIT/ML IJ SOLN
INTRAMUSCULAR | Status: DC | PRN
  Administered 2021-04-30: 5000 [IU] via INTRAVENOUS

## 2021-04-30 MED ORDER — CEFAZOLIN SODIUM-DEXTROSE 1-4 GM/50ML-% IV SOLN: 1.0000 g | Freq: Once | INTRAVENOUS | Status: DC

## 2021-04-30 MED ORDER — MIDAZOLAM HCL 2 MG/2ML IJ SOLN
INTRAMUSCULAR | Status: AC
  Filled 2021-04-30: qty 2

## 2021-04-30 MED ORDER — ONDANSETRON HCL 4 MG/2ML IJ SOLN
INTRAMUSCULAR | Status: AC
  Filled 2021-04-30: qty 2

## 2021-04-30 MED ORDER — PROPOFOL 10 MG/ML IV BOLUS
INTRAVENOUS | Status: DC | PRN
  Administered 2021-04-30: 30 mg via INTRAVENOUS

## 2021-04-30 MED ORDER — ACETAMINOPHEN 10 MG/ML IV SOLN
INTRAVENOUS | Status: AC
  Filled 2021-04-30: qty 100

## 2021-04-30 MED ORDER — LIDOCAINE HCL (PF) 2 % IJ SOLN
INTRAMUSCULAR | Status: AC
  Filled 2021-04-30: qty 5

## 2021-04-30 MED ORDER — FENTANYL CITRATE (PF) 100 MCG/2ML IJ SOLN
INTRAMUSCULAR | Status: AC
  Filled 2021-04-30: qty 2

## 2021-04-30 MED ORDER — DIPHENHYDRAMINE HCL 50 MG/ML IJ SOLN: 50.0000 mg | Freq: Once | INTRAMUSCULAR | Status: DC | PRN

## 2021-04-30 MED ORDER — SODIUM CHLORIDE 0.9 % IV SOLN: INTRAVENOUS | Status: DC

## 2021-04-30 MED ORDER — KETOROLAC TROMETHAMINE 30 MG/ML IJ SOLN
INTRAMUSCULAR | Status: AC
  Filled 2021-04-30: qty 1

## 2021-04-30 MED ORDER — MIDAZOLAM HCL 2 MG/2ML IJ SOLN
INTRAMUSCULAR | Status: DC | PRN
  Administered 2021-04-30: 2 mg via INTRAVENOUS

## 2021-04-30 MED ORDER — HYDROMORPHONE HCL 1 MG/ML IJ SOLN: 1.0000 mg | Freq: Once | INTRAMUSCULAR | Status: DC | PRN

## 2021-04-30 MED ORDER — FAMOTIDINE 20 MG PO TABS: 40.0000 mg | ORAL_TABLET | Freq: Once | ORAL | Status: DC | PRN

## 2021-04-30 MED ORDER — IODIXANOL 320 MG/ML IV SOLN
INTRAVENOUS | Status: DC | PRN
  Administered 2021-04-30: 35 mL via INTRA_ARTERIAL

## 2021-04-30 MED ORDER — MIDAZOLAM HCL 2 MG/ML PO SYRP: 8.0000 mg | ORAL_SOLUTION | Freq: Once | ORAL | Status: DC | PRN

## 2021-04-30 MED ORDER — METHYLPREDNISOLONE SODIUM SUCC 125 MG IJ SOLR: 125.0000 mg | Freq: Once | INTRAMUSCULAR | Status: DC | PRN

## 2021-04-30 SURGICAL SUPPLY — 16 items
BALLN LUTONIX 6X120X130 (BALLOONS) ×2
BALLN LUTONIX DCB 5X80X130 (BALLOONS) ×2
BALLOON LUTONIX 6X120X130 (BALLOONS) IMPLANT
BALLOON LUTONIX DCB 5X80X130 (BALLOONS) IMPLANT
CATH ANGIO 5F PIGTAIL 65CM (CATHETERS) ×1 IMPLANT
CATH TEMPO 5F RIM 65CM (CATHETERS) ×1 IMPLANT
COVER PROBE U/S 5X48 (MISCELLANEOUS) ×1 IMPLANT
DEVICE STARCLOSE SE CLOSURE (Vascular Products) ×1 IMPLANT
GLIDEWIRE ADV .035X260CM (WIRE) ×1 IMPLANT
KIT ENCORE 26 ADVANTAGE (KITS) ×1 IMPLANT
PACK ANGIOGRAPHY (CUSTOM PROCEDURE TRAY) ×2 IMPLANT
SHEATH ANL2 6FRX45 HC (SHEATH) ×1 IMPLANT
SHEATH BRITE TIP 5FRX11 (SHEATH) ×1 IMPLANT
SYR MEDRAD MARK 7 150ML (SYRINGE) ×1 IMPLANT
TUBING CONTRAST HIGH PRESS 72 (TUBING) ×1 IMPLANT
WIRE GUIDERIGHT .035X150 (WIRE) ×2 IMPLANT

## 2021-04-30 NOTE — Plan of Care (Signed)
  Problem: Education: Goal: Knowledge of General Education information will improve Description: Including pain rating scale, medication(s)/side effects and non-pharmacologic comfort measures 04/30/2021 1654 by Jordan Likes, RN Outcome: Progressing 04/30/2021 1507 by Jordan Likes, RN Outcome: Progressing   Problem: Health Behavior/Discharge Planning: Goal: Ability to manage health-related needs will improve 04/30/2021 1654 by Jordan Likes, RN Outcome: Progressing 04/30/2021 1507 by Jordan Likes, RN Outcome: Progressing   Problem: Clinical Measurements: Goal: Ability to maintain clinical measurements within normal limits will improve 04/30/2021 1654 by Jordan Likes, RN Outcome: Progressing 04/30/2021 1507 by Jordan Likes, RN Outcome: Progressing Goal: Will remain free from infection 04/30/2021 1654 by Jordan Likes, RN Outcome: Progressing 04/30/2021 1507 by Jordan Likes, RN Outcome: Progressing Goal: Diagnostic test results will improve 04/30/2021 1654 by Jordan Likes, RN Outcome: Progressing 04/30/2021 1507 by Jordan Likes, RN Outcome: Progressing Goal: Respiratory complications will improve 04/30/2021 1654 by Jordan Likes, RN Outcome: Progressing 04/30/2021 1507 by Jordan Likes, RN Outcome: Progressing Goal: Cardiovascular complication will be avoided 04/30/2021 1654 by Jordan Likes, RN Outcome: Progressing 04/30/2021 1507 by Jordan Likes, RN Outcome: Progressing   Problem: Activity: Goal: Risk for activity intolerance will decrease 04/30/2021 1654 by Jordan Likes, RN Outcome: Progressing 04/30/2021 1507 by Jordan Likes, RN Outcome: Progressing   Problem: Elimination: Goal: Will not experience complications related to bowel motility 04/30/2021 1654 by Jordan Likes, RN Outcome: Progressing 04/30/2021 1507 by Jordan Likes, RN Outcome: Progressing Goal: Will not experience  complications related to urinary retention 04/30/2021 1654 by Jordan Likes, RN Outcome: Progressing 04/30/2021 1507 by Jordan Likes, RN Outcome: Progressing   Problem: Pain Managment: Goal: General experience of comfort will improve 04/30/2021 1654 by Jordan Likes, RN Outcome: Progressing 04/30/2021 1507 by Jordan Likes, RN Outcome: Progressing   Problem: Safety: Goal: Ability to remain free from injury will improve 04/30/2021 1654 by Jordan Likes, RN Outcome: Progressing 04/30/2021 1507 by Jordan Likes, RN Outcome: Progressing   Problem: Skin Integrity: Goal: Risk for impaired skin integrity will decrease 04/30/2021 1654 by Jordan Likes, RN Outcome: Progressing 04/30/2021 1507 by Jordan Likes, RN Outcome: Progressing   Problem: Clinical Measurements: Goal: Ability to avoid or minimize complications of infection will improve 04/30/2021 1654 by Jordan Likes, RN Outcome: Progressing 04/30/2021 1507 by Jordan Likes, RN Outcome: Progressing   Problem: Skin Integrity: Goal: Skin integrity will improve 04/30/2021 1654 by Jordan Likes, RN Outcome: Progressing 04/30/2021 1507 by Jordan Likes, RN Outcome: Progressing

## 2021-04-30 NOTE — Progress Notes (Signed)
PROGRESS NOTE    [name]  YHT:[id number] DOB: 01-31-1956 DOA: 04/16/2021 PCP: Juline Patch, MD    Brief Narrative:  unfortunate 65 year old female with extensive past medical history who was brought in via EMS for evaluation of bilateral lower extremity redness swelling and pain.  Apparently patient has been in her wheelchair at home unable to move for several days.  Lower extremity significant cellulitic changes.  On broad-spectrum antibiotics.  Also some question of fracture in the feet on the right lower extremity.   Podiatry consulted on admission,Concern regarding vascular supply lower extremities.  Podiatry apparently reached out to vascular surgery for consultation prior to any surgical intervention.  Awaiting vascular evaluation to assess lower extremity vasculopathy. WOC following. ABI completed with results significant for severe bilateral peripheral arterial disease.    Angiogram delayed several times due to patient refusal and concerned about capacity.  Angiogram completed on 11/1.  Successful recanalization right lower extremity for limb salvage.  Podiatry is deferring surgical intervention until we have angiography and revascularization attempt on both lower extremities.  Now the right is been completed the patient is apparently refusing left lower extremity angiogram.  Vascular surgery is offered general anesthesia but at this time the patient continues to refuse.  11/4: Patient agreed to angiography this morning.  Notify vascular surgery     Assessment & Plan:  Sepsis Severe bilateral lower extremity cellulitis, nonpurulent Sepsis criteria met with tachycardia and leukocytosis.  Presumed source bilateral lower extremity nonpurulent cellulitis Concern regarding vascular supply Podiatry and vascular consulted MRSA screen negative 7/26 vancomycin and cefepime were discontinued.  Patient was started on Ancef for nonpurulent cellulitis 10/30-sensitivity to wound  culture came back we will switch her antibiotics to cefepime as currently resistant to cefazolin. -Angiogram with successful recanalization right lower extremity for limb salvage on 11/1 -Needs angio of left lower extremity however currently patient is refusing despite for vascular for general anesthesia Plan: Continue IV cefepime.  Tentative 10-day stop date placed.  Left lower extremity angiogram today.  Podiatry follow-up.   Acute kidney injury on chronic kidney disease stage IIIb ATN versus prerenal azotemia 10/31 creatinine mildly elevated at 1.51 11/1 improving again with ivf Continue to hold telmisartan Monitor renal function post angiogram Nephrotoxins    Hyperkalemia Improved and remained stable   Hypothyroidism Continue Synthroid   Type 2 diabetes mellitus with peripheral neuropathy Oral medications on hold BG overall stable Continue RISS Continue Neurontin   Essential hypertension Better controlled Continue Toprol, clonidine, amlodipine ARB on hold as above IV hydralazine   Morbid obesity BMI 44.69 This complicates overall care and prognosis Nutrition consult   Poor self-care TOC consult DSS evaluation pending  DVT prophylaxis: SQ Lovenox Code Status: Full Family Communication: None today Disposition Plan: Status is: Inpatient  Remains inpatient appropriate because: Needs left lower extremity angiogram prior to any surgical intervention from podiatry.  Plan for angiography today.  Podiatry follow-up after       Level of care: Med-Surg  Consultants:  Vascular surgery Podiatry  Procedures:  Right lower extremity angiogram 11/1 Left lower extremity angiogram 11/4  Antimicrobials: Cefepime   Subjective: Patient seen and examined.  Resting in bed.  No visible distress.  Objective: Vitals:   04/29/21 1535 04/30/21 0344 04/30/21 0741 04/30/21 1201  BP: (!) 126/53 (!) 149/60 (!) 149/58 (!) 169/76  Pulse: (!) 53 (!) 51 (!) 52 64  Resp: _0 Temp: 98.5 F (36.9 C) 98.9 F (37.2 C) 97.8 F (  36.6 C) 98.3 F (36.8 C)  TempSrc: Oral Oral Oral Oral  SpO2: 100% 97% 98% 99%  Weight:      Height:        Intake/Output Summary (Last 24 hours) at 04/30/2021 1259 Last data filed at 04/30/2021 1255 Gross per 24 hour  Intake 660 ml  Output 310 ml  Net 350 ml   Filed Weights   04/16/21 2244 04/17/21 0403  Weight: 122.5 kg 118.5 kg    Examination:  General exam: No acute distress Respiratory system: Poor respiratory effort.  Lungs clear.  Normal work breathing.  Room air Cardiovascular system: S1-S2, RRR, no murmurs Gastrointestinal system: Obese, NT/ND, normal bowel sounds Central nervous system: Alert and oriented. No focal neurological deficits. Extremities: Decreased power symmetrically.  Bilateral lower extremities and boots in surgical dressings Skin: Significant bilateral lower extremity cellulitis Psychiatry: Judgement and insight appear normal. Mood & affect appropriate.     Data Reviewed: I have personally reviewed following labs and imaging studies  CBC: Recent Labs  Lab 04/24/21 0430 04/26/21 0424 04/29/21 0749  WBC 11.6* 10.1 9.0  NEUTROABS 8.5*  --  6.2  HGB 10.3* 10.3* 9.9*  HCT 31.9* 32.7* 32.4*  MCV 92.7 95.1 98.5  PLT 337 345 503   Basic Metabolic Panel: Recent Labs  Lab 04/25/21 0414 04/26/21 0424 04/27/21 0558 04/29/21 0749 04/30/21 0528  NA 137 135 134* 137 137  K 4.0 4.0 4.1 4.3 4.2  CL 112* 110 108 111 112*  CO2 19* 21* 20* 20* 19*  GLUCOSE 165* 199* 187* 206* 175*  BUN _0 CREATININE 1.25* 1.51* 1.36* 1.40* 1.33*  CALCIUM 7.7* 7.8* 8.3* 8.2* 8.2*   GFR: Estimated Creatinine Clearance: 52.9 mL/min (A) (by C-G formula based on SCr of 1.33 mg/dL (H)). Liver Function Tests: No results for input(s): AST, ALT, ALKPHOS, BILITOT, PROT, ALBUMIN in the last 168 hours. No results for input(s): LIPASE, AMYLASE in the last 168 hours. No results for input(s): AMMONIA  in the last 168 hours. Coagulation Profile: No results for input(s): INR, PROTIME in the last 168 hours. Cardiac Enzymes: No results for input(s): CKTOTAL, CKMB, CKMBINDEX, TROPONINI in the last 168 hours. BNP (last 3 results) No results for input(s): PROBNP in the last 8760 hours. HbA1C: No results for input(s): HGBA1C in the last 72 hours. CBG: Recent Labs  Lab 04/29/21 1654 04/29/21 2120 04/30/21 0743 04/30/21 1131 04/30/21 1211  GLUCAP 130* 190* 163* 174* 154*   Lipid Profile: No results for input(s): CHOL, HDL, LDLCALC, TRIG, CHOLHDL, LDLDIRECT in the last 72 hours. Thyroid Function Tests: No results for input(s): TSH, T4TOTAL, FREET4, T3FREE, THYROIDAB in the last 72 hours. Anemia Panel: No results for input(s): VITAMINB12, FOLATE, FERRITIN, TIBC, IRON, RETICCTPCT in the last 72 hours. Sepsis Labs: No results for input(s): PROCALCITON, LATICACIDVEN in the last 168 hours.  Recent Results (from the past 240 hour(s))  Aerobic/Anaerobic Culture w Gram Stain (surgical/deep wound)     Status: None   Collection Time: 04/21/21  6:20 PM   Specimen: Wound  Result Value Ref Range Status   Specimen Description   Final    WOUND Performed at [facility], [address] [place], [place] [address]    Special Requests   Final    NONE Performed at [facility], [address] [place], [place] [address]    Gram Stain NO WBC SEEN NO ORGANISMS SEEN   Final   Culture   Final  ABUNDANT PROTEUS VULGARIS FEW PREVOTELLA SPECIES BETA LACTAMASE POSITIVE Performed at [facility], [place] [address] [place], [place] [address]    Report Status 04/27/2021 FINAL  Final   Organism ID, Bacteria PROTEUS VULGARIS  Final      Susceptibility   Proteus vulgaris - MIC*    AMPICILLIN >=32 RESISTANT Resistant     CEFAZOLIN >=64 RESISTANT Resistant     CEFEPIME <=0.12 SENSITIVE Sensitive     CEFTAZIDIME <=1 SENSITIVE Sensitive     CIPROFLOXACIN <=0.25 SENSITIVE  Sensitive     GENTAMICIN <=1 SENSITIVE Sensitive     IMIPENEM 2 SENSITIVE Sensitive     TRIMETH/SULFA <=20 SENSITIVE Sensitive     AMPICILLIN/SULBACTAM 8 SENSITIVE Sensitive     PIP/TAZO <=4 SENSITIVE Sensitive     * ABUNDANT PROTEUS VULGARIS         Radiology Studies: PERIPHERAL VASCULAR CATHETERIZATION  Result Date: 04/30/2021 See surgical note for result.       Scheduled Meds:  [MAR Hold] acidophilus  2 capsule Oral Daily   [MAR Hold] amLODipine  5 mg Oral Daily   [MAR Hold] atorvastatin  20 mg Oral Daily   [MAR Hold] cloNIDine  0.3 mg Oral TID   [MAR Hold] clopidogrel  75 mg Oral Q breakfast   [MAR Hold] enoxaparin (LOVENOX) injection  0.5 mg/kg Subcutaneous Q24H   [MAR Hold] gabapentin  300 mg Oral BID   [MAR Hold] hydrocerin   Topical Daily   [MAR Hold] insulin aspart  0-15 Units Subcutaneous TID WC   [MAR Hold] insulin aspart  0-5 Units Subcutaneous QHS   [MAR Hold] levothyroxine  112 mcg Oral Q0600   [MAR Hold] metoprolol succinate  50 mg Oral Q breakfast   [MAR Hold] sodium chloride flush  3 mL Intravenous Q12H   Continuous Infusions:  sodium chloride 75 mL/hr at 04/30/21 0615   sodium chloride 75 mL/hr at 04/30/21 1222   [MAR Hold] ceFEPime (MAXIPIME) IV 2 g (04/30/21 0905)   [MAR Hold] metronidazole 500 mg (04/30/21 0224)     LOS: 13 days    Time spent: 25 minutes    Sidney Ace, MD [facility]   If 7PM-7AM, please contact night-coverage  04/30/2021, 12:59 PM

## 2021-04-30 NOTE — Progress Notes (Addendum)
PODIATRY / FOOT AND ANKLE SURGERY PROGRESS NOTE  Chief Complaint: Foot pain/infection/leg swelling   HPI: [name] is a 65 y.o. female who presents today resting in bed comfortably.  Patient had vascular procedure today to improve blood flow to the left lower extremity.  Patient is resting today with dressings on bilateral lower extremities.  PMHx:  Past Medical History:  Diagnosis Date   Diabetes mellitus without complication ([place]) 7948   Hypertension    Hypothyroidism    Kidney stone 11/2014   history of/STAGE 4 KIDNEY DISEASE PER DR Juleen China   Lymphedema    Multiple open wounds of lower leg    PT BEING SEEN BY THE WOUND CENTER FOR CHRONIC BLISTERS PER PT    Surgical Hx:  Past Surgical History:  Procedure Laterality Date   LOWER EXTREMITY ANGIOGRAPHY Right 04/27/2021   Procedure: Lower Extremity Angiography;  Surgeon: Katha Cabal, MD;  Location: [facility];  Service: Cardiovascular;  Laterality: Right;   TOTAL HIP ARTHROPLASTY Right 05/28/2015   Procedure: TOTAL HIP ARTHROPLASTY ANTERIOR APPROACH;  Surgeon: Hessie Knows, MD;  Location: [facility];  Service: Orthopedics;  Laterality: Right;    FHx:  Family History  Problem Relation Age of Onset   Cancer Mother    Diabetes Father     Social History:  reports that she has never smoked. She has never used smokeless tobacco. She reports that she does not drink alcohol and does not use drugs.  Allergies: No Known Allergies   Medications Prior to Admission  Medication Sig Dispense Refill   acetaminophen (TYLENOL) 500 MG tablet Take 500-1,000 mg by mouth every 6 (six) hours as needed for mild pain or moderate pain.     aspirin EC 81 MG tablet Take 1 tablet (81 mg total) by mouth daily. 90 tablet 3   atorvastatin (LIPITOR) 20 MG tablet Take 1 tablet (20 mg total) by mouth daily. 90 tablet 1   blood glucose meter kit and supplies 1 each by Other route daily. ONE TOUCH ULTRA METER. Use as directed. E11.29 1  each 0   cloNIDine (CATAPRES) 0.3 MG tablet TAKE ONE TABLET BY MOUTH 3 TIMES DAILY. 270 tablet 1   furosemide (LASIX) 40 MG tablet Take 1 tablet (40 mg total) by mouth 2 (two) times daily. 180 tablet 1   gabapentin (NEURONTIN) 300 MG capsule Take 1 capsule (300 mg total) by mouth 3 (three) times daily as needed (pain). (Patient taking differently: Take 300 mg by mouth at bedtime.) 20 capsule 0   glipiZIDE (GLUCOTROL) 5 MG tablet TAKE 1 TABLET BY MOUTH twice a day 180 tablet 0   glucose blood test strip 1 each by Other route daily. ONE TOUCH ULTRA TEST STRIPS E11.29 100 strip 3   Lancets (ONETOUCH ULTRASOFT) lancets 1 each by Other route daily. Use as instructed E11.29 100 each 3   levothyroxine (SYNTHROID) 112 MCG tablet Take 1 tablet (112 mcg total) by mouth daily before breakfast. 90 tablet 0   metoprolol succinate (TOPROL-XL) 50 MG 24 hr tablet Take 1 tablet (50 mg total) by mouth daily. 90 tablet 1   telmisartan (MICARDIS) 80 MG tablet TAKE ONE TABLET BY MOUTH EVERY DAY 90 tablet 1    Physical Exam: General: Alert and oriented.  No apparent distress.  Vascular: DP/PT pulses unable to be palpated due to bilateral lower extremity swelling, lymphedema appears to be moderate to severe.  Feet do appear to be warm to touch bilateral.  Neuro: Light touch  sensation reduced to bilateral lower extremities.  Derm: Large ulcerations to the posterior aspects of both lower legs near the calf area consistent with venous type ulcerations or lymphedema type wounds.  Serous drainage present from these areas, wound beds appear to be granular overall.  Large ulceration to the plantar posterior aspect the left heel which appears to be a pressure type ulceration that probes deep nearly to bone, area appears to be necrotic overall with large amount of fibrous tissue, no granularity noted, mild odor.  Distal tip of the left hallux appears to have a small superficial necrotic eschar present to the distal tip.  No  associated erythema or edema.  No drainage.  Right distal tip hallux with eschar present to the distal tip, no associated erythema or edema, no drainage.  Appears to have a small opening also at the plantar aspect of the fifth metatarsal phalangeal joint that probes to bone, mild odor present, no erythema or associated edema specifically to this area, mild serous drainage.,  Appears stable  Preulcerative lesion to the plantar aspect of the right heel likely due to pressure present with mild hemorrhagic change.  Nails appear to be thickened, relatively well trimmed, discolored, dystrophic and brittle subungual debris.  Skin appears to be thin and atrophic to bilateral lower extremities with large areas of peeling dry skin present.  MSK: 3/5 strength bilateral lower extremity muscle groups.  Pain on palpation to the left heel and right fifth metatarsal phalangeal joint area as well as both big toes.  Pain on palpation of the posterior aspects of both lower legs in the area of large venous type ulcerations.  Results for orders placed or performed during the hospital encounter of 04/16/21 (from the past 48 hour(s))  Glucose, capillary     Status: Abnormal   Collection Time: 04/28/21  9:16 PM  Result Value Ref Range   Glucose-Capillary 189 (H) 70 - 99 mg/dL    Comment: Glucose reference range applies only to samples taken after fasting for at least 8 hours.  Glucose, capillary     Status: Abnormal   Collection Time: 04/29/21  7:42 AM  Result Value Ref Range   Glucose-Capillary 181 (H) 70 - 99 mg/dL    Comment: Glucose reference range applies only to samples taken after fasting for at least 8 hours.  CBC with Differential/Platelet     Status: Abnormal   Collection Time: 04/29/21  7:49 AM  Result Value Ref Range   WBC 9.0 4.0 - 10.5 K/uL   RBC 3.29 (L) 3.87 - 5.11 MIL/uL   Hemoglobin 9.9 (L) 12.0 - 15.0 g/dL   HCT 04.4 (L) 24.8 - 31.2 %   MCV 98.5 80.0 - 100.0 fL   MCH 30.1 26.0 - 34.0 pg    MCHC 30.6 30.0 - 36.0 g/dL   RDW 66.8 (H) 58.2 - 68.5 %   Platelets 301 150 - 400 K/uL   nRBC 0.0 0.0 - 0.2 %   Neutrophils Relative % 68 %   Neutro Abs 6.2 1.7 - 7.7 K/uL   Lymphocytes Relative 13 %   Lymphs Abs 1.2 0.7 - 4.0 K/uL   Monocytes Relative 11 %   Monocytes Absolute 1.0 0.1 - 1.0 K/uL   Eosinophils Relative 6 %   Eosinophils Absolute 0.5 0.0 - 0.5 K/uL   Basophils Relative 1 %   Basophils Absolute 0.1 0.0 - 0.1 K/uL   Immature Granulocytes 1 %   Abs Immature Granulocytes 0.07 0.00 - 0.07 K/uL  Comment: Performed at [facility], [address] [place], [place] [address]  Basic metabolic panel     Status: Abnormal   Collection Time: 04/29/21  7:49 AM  Result Value Ref Range   Sodium 137 135 - 145 mmol/L   Potassium 4.3 3.5 - 5.1 mmol/L   Chloride 111 98 - 111 mmol/L   CO2 20 (L) 22 - 32 mmol/L   Glucose, Bld 206 (H) 70 - 99 mg/dL    Comment: Glucose reference range applies only to samples taken after fasting for at least 8 hours.   BUN 20 8 - 23 mg/dL   Creatinine, Ser 1.10 (H) 0.44 - 1.00 mg/dL   Calcium 8.2 (L) 8.9 - 10.3 mg/dL   GFR, Estimated 41 (L) >60 mL/min    Comment: (NOTE) Calculated using the CKD-EPI Creatinine Equation (2021)    Anion gap 6 5 - 15    Comment: Performed at [facility], [address] [place], [place] [address]  Glucose, capillary     Status: Abnormal   Collection Time: 04/29/21 11:47 AM  Result Value Ref Range   Glucose-Capillary 207 (H) 70 - 99 mg/dL    Comment: Glucose reference range applies only to samples taken after fasting for at least 8 hours.   Comment 1 Notify RN   Glucose, capillary     Status: Abnormal   Collection Time: 04/29/21  4:54 PM  Result Value Ref Range   Glucose-Capillary 130 (H) 70 - 99 mg/dL    Comment: Glucose reference range applies only to samples taken after fasting for at least 8 hours.   Comment 1 Notify RN   Glucose, capillary     Status: Abnormal   Collection Time: 04/29/21   9:20 PM  Result Value Ref Range   Glucose-Capillary 190 (H) 70 - 99 mg/dL    Comment: Glucose reference range applies only to samples taken after fasting for at least 8 hours.  Basic metabolic panel     Status: Abnormal   Collection Time: 04/30/21  5:28 AM  Result Value Ref Range   Sodium 137 135 - 145 mmol/L   Potassium 4.2 3.5 - 5.1 mmol/L   Chloride 112 (H) 98 - 111 mmol/L   CO2 19 (L) 22 - 32 mmol/L   Glucose, Bld 175 (H) 70 - 99 mg/dL    Comment: Glucose reference range applies only to samples taken after fasting for at least 8 hours.   BUN 21 8 - 23 mg/dL   Creatinine, Ser 6.87 (H) 0.44 - 1.00 mg/dL   Calcium 8.2 (L) 8.9 - 10.3 mg/dL   GFR, Estimated 44 (L) >60 mL/min    Comment: (NOTE) Calculated using the CKD-EPI Creatinine Equation (2021)    Anion gap 6 5 - 15    Comment: Performed at [facility], [address] [place], [place] [address]  Glucose, capillary     Status: Abnormal   Collection Time: 04/30/21  7:43 AM  Result Value Ref Range   Glucose-Capillary 163 (H) 70 - 99 mg/dL    Comment: Glucose reference range applies only to samples taken after fasting for at least 8 hours.   Comment 1 Notify RN   Glucose, capillary     Status: Abnormal   Collection Time: 04/30/21 11:31 AM  Result Value Ref Range   Glucose-Capillary 174 (H) 70 - 99 mg/dL    Comment: Glucose reference range applies only to samples taken after fasting for at least 8 hours.  Comment 1 Notify RN   Glucose, capillary     Status: Abnormal   Collection Time: 04/30/21 12:11 PM  Result Value Ref Range   Glucose-Capillary 154 (H) 70 - 99 mg/dL    Comment: Glucose reference range applies only to samples taken after fasting for at least 8 hours.  Glucose, capillary     Status: Abnormal   Collection Time: 04/30/21  4:38 PM  Result Value Ref Range   Glucose-Capillary 177 (H) 70 - 99 mg/dL    Comment: Glucose reference range applies only to samples taken after fasting for at least 8 hours.    Comment 1 Notify RN    PERIPHERAL VASCULAR CATHETERIZATION  Result Date: 04/30/2021 See surgical note for result.   Blood pressure (!) 138/56, pulse (!) 59, temperature 97.9 F (36.6 C), temperature source Oral, resp. rate 14, height $RemoveBe'5\' 5"'nZOYAtgtA$  (1.651 m), weight 118.5 kg, SpO2 99 %.   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 Diabetes type 2 polyneuropathy PVD Failure to thrive  Plan -Patient seen and examined today. -Wound still remains stable at this time.  Patient afebrile with vital signs stable.  WBC 9.  Wound culture growing Proteus.  Appreciate medicine recommendations for IV antibiotic therapy. -Discussed previous bone scan results, x-ray imaging, and clinical evaluation.  Patient appears to have osteomyelitis to the right hallux distally, right fifth metatarsal phalangeal joint, and potentially left calcaneus. -Discussed all treatment options with the patient both conservative and surgical attempts at correction including potential risks and complications.  At this time patient has elected for surgical procedure consisting of right partial fifth ray amputation, right partial hallux amputation, bilateral lower leg posterior wound debridements, left calcaneus bone biopsy/culture with wound debridement, antibiotic bead application, and wound VAC therapy.  Patient has elected to go forward with surgical intervention.  All questions answered. -Discussed patient is still at high risk for limb loss.  Patient's blood flow has been improved from vascular standpoint, optimized for surgery. -We will allow tissues to demarcate slightly further over the weekend after recent subsequent CTA with interventions.  Plan for surgical intervention on Monday, 05/03/2021 at around 4:30 PM.  Patient  to be n.p.o. after breakfast at 8 AM for surgical intervention. -Continue with current dressing changes.  Patient may be weightbearing as tolerated to the right foot at this time but is to remain nonweightbearing to the left lower extremity with toe touching only due to left heel ulcer.  Appreciate PT/OT Rex.   Caroline More, DPM 04/30/2021, 5:04 PM

## 2021-04-30 NOTE — Plan of Care (Signed)
  Problem: Education: Goal: Knowledge of General Education information will improve Description: Including pain rating scale, medication(s)/side effects and non-pharmacologic comfort measures Outcome: Progressing   Problem: Health Behavior/Discharge Planning: Goal: Ability to manage health-related needs will improve Outcome: Progressing   Problem: Clinical Measurements: Goal: Ability to maintain clinical measurements within normal limits will improve Outcome: Progressing Goal: Will remain free from infection Outcome: Progressing Goal: Diagnostic test results will improve Outcome: Progressing Goal: Respiratory complications will improve Outcome: Progressing Goal: Cardiovascular complication will be avoided Outcome: Progressing   Problem: Activity: Goal: Risk for activity intolerance will decrease Outcome: Progressing   Problem: Nutrition: Goal: Adequate nutrition will be maintained Outcome: Progressing   Problem: Elimination: Goal: Will not experience complications related to bowel motility Outcome: Progressing Goal: Will not experience complications related to urinary retention Outcome: Progressing   Problem: Coping: Goal: Level of anxiety will decrease Outcome: Progressing   Problem: Pain Managment: Goal: General experience of comfort will improve Outcome: Progressing   Problem: Skin Integrity: Goal: Risk for impaired skin integrity will decrease Outcome: Progressing   Problem: Safety: Goal: Ability to remain free from injury will improve Outcome: Progressing   Problem: Clinical Measurements: Goal: Ability to avoid or minimize complications of infection will improve Outcome: Progressing   Problem: Skin Integrity: Goal: Skin integrity will improve Outcome: Progressing

## 2021-04-30 NOTE — Transfer of Care (Signed)
Immediate Anesthesia Transfer of Care Note  Patient: [name]  Procedure(s) Performed: Lower Extremity Angiography (Left)  Patient Location: PACU  Anesthesia Type:General  Level of Consciousness: drowsy and patient cooperative  Airway & Oxygen Therapy: Patient Spontanous Breathing and Patient connected to nasal cannula oxygen  Post-op Assessment: Report given to RN and Post -op Vital signs reviewed and stable  Post vital signs: Reviewed and stable  Last Vitals:  Vitals Value Taken Time  BP    Temp    Pulse    Resp    SpO2      Last Pain:  Vitals:   04/30/21 1201  TempSrc: Oral  PainSc: 10-Worst pain ever         Complications: No notable events documented.

## 2021-04-30 NOTE — Interval H&P Note (Signed)
History and Physical Interval Note:  04/30/2021 12:18 PM  [name]  has presented today for surgery, with the diagnosis of PAD.  The various methods of treatment have been discussed with the patient and family. After consideration of risks, benefits and other options for treatment, the patient has consented to  Procedure(s): Lower Extremity Angiography (Left) as a surgical intervention.  The patient's history has been reviewed, patient examined, no change in status, stable for surgery.  I have reviewed the patient's chart and labs.  Questions were answered to the patient's satisfaction.     Leotis Pain

## 2021-04-30 NOTE — Progress Notes (Signed)
OT Cancellation Note  Patient Details Name: [name] MRN: [id number] DOB: May 04, 1956   Cancelled Treatment:    Reason Eval/Treat Not Completed: Patient at procedure or test/ unavailable. Pt out of the room. Per chart, schedule for LLE Angiography. Will re-attempt at later date/time as pt is available.   Ardeth Perfect., MPH, MS, OTR/L ascom [phone] 04/30/21, 12:23 PM

## 2021-04-30 NOTE — Anesthesia Preprocedure Evaluation (Addendum)
Anesthesia Evaluation  Patient identified by MRN, date of birth, ID band Patient awake    Reviewed: Allergy & Precautions, NPO status , Patient's Chart, lab work & pertinent test results  History of Anesthesia Complications Negative for: history of anesthetic complications  Airway Mallampati: IV   Neck ROM: Full    Dental  (+) Poor Dentition   Pulmonary neg pulmonary ROS,    Pulmonary exam normal breath sounds clear to auscultation       Cardiovascular hypertension, + Peripheral Vascular Disease  Normal cardiovascular exam Rhythm:Regular Rate:Normal  ECG 04/16/21:  Sinus tachycardia (HR 121) Multiple premature complexes, vent & supraven Anterior infarct, old Nonspecific T abnormalities, lateral leads   Neuro/Psych negative neurological ROS     GI/Hepatic negative GI ROS,   Endo/Other  diabetes, Type 2Hypothyroidism Class 3 obesity  Renal/GU Renal disease (nephrolithiasis, stage III CKD)     Musculoskeletal   Abdominal   Peds  Hematology negative hematology ROS (+)   Anesthesia Other Findings Sepsis 2/2 LE cellulitis  Reproductive/Obstetrics                            Anesthesia Physical Anesthesia Plan  ASA: 3  Anesthesia Plan: General   Post-op Pain Management:    Induction: Intravenous  PONV Risk Score and Plan: 3 and Treatment may vary due to age or medical condition, Ondansetron, Propofol infusion and TIVA  Airway Management Planned: Natural Airway  Additional Equipment:   Intra-op Plan:   Post-operative Plan: Extubation in OR  Informed Consent: I have reviewed the patients History and Physical, chart, labs and discussed the procedure including the risks, benefits and alternatives for the proposed anesthesia with the patient or authorized representative who has indicated his/her understanding and acceptance.     Dental advisory given  Plan Discussed with:  CRNA  Anesthesia Plan Comments: (LMA/GETA backup.  Patient consented for risks of anesthesia including but not limited to:  - adverse reactions to medications - damage to eyes, teeth, lips or other oral mucosa - nerve damage due to positioning  - sore throat or hoarseness - damage to heart, brain, nerves, lungs, other parts of body or loss of life  Informed patient about role of CRNA in peri- and intra-operative care.  Patient voiced understanding.)       Anesthesia Quick Evaluation

## 2021-04-30 NOTE — Anesthesia Postprocedure Evaluation (Signed)
Anesthesia Post Note  Patient: [name]  Procedure(s) Performed: Lower Extremity Angiography (Left)  Patient location during evaluation: PACU Anesthesia Type: General Level of consciousness: awake and alert, oriented and patient cooperative Pain management: pain level controlled Vital Signs Assessment: post-procedure vital signs reviewed and stable Respiratory status: spontaneous breathing, nonlabored ventilation and respiratory function stable Cardiovascular status: blood pressure returned to baseline and stable Postop Assessment: adequate PO intake Anesthetic complications: no   No notable events documented.   Last Vitals:  Vitals:   04/30/21 1315 04/30/21 1330  BP: (!) 134/53 (!) 154/67  Pulse: (!) 56 64  Resp: 14 14  Temp:    SpO2: 100% 100%    Last Pain:  Vitals:   04/30/21 1330  TempSrc:   PainSc: 0-No pain                 Darrin Nipper

## 2021-04-30 NOTE — Progress Notes (Signed)
PT Cancellation Note  Patient Details Name: [name] MRN: [id number] DOB: 1955/12/01   Cancelled Treatment:    Reason Eval/Treat Not Completed: Patient at procedure or test/unavailable.  Chart reviewed and attempted to see pt.  Pt currently scheduled and off floor for angiogram of the L LE.  Will attempt to see pt at later date as medically appropriate.   Gwenlyn Saran, PT, DPT 04/30/21, 2:58 PM

## 2021-04-30 NOTE — Op Note (Signed)
[facility]  Percutaneous Study/Intervention Procedural Note   Date of Surgery: 04/30/2021  Surgeon(s):Cashlyn Huguley    Assistants:none  Pre-operative Diagnosis: PAD with ulceration  Post-operative diagnosis:  Same  Procedure(s) Performed:             1.  Ultrasound guidance for vascular access right femoral artery             2.  Catheter placement into left superficial femoral artery from right femoral artery             3.  Aortogram and selective left lower extremity angiogram             4.  Percutaneous transluminal angioplasty of left popliteal artery with 5 mm diameter and 6 mm diameter Lutonix drug-coated angioplasty balloons             5.  StarClose closure device right femoral artery  EBL: 5 cc  Contrast: 35 cc  Fluoro Time: 2.9 minutes  Anesthesia: MAC              Indications:  Patient is a 65 y.o.female with severe peripheral arterial disease on both legs with ulceration having already had the right lower extremity treated. The patient is brought in for angiography for further evaluation and potential treatment.  Due to the limb threatening nature of the situation, angiogram was performed for attempted limb salvage. The patient is aware that if the procedure fails, amputation would be expected.  The patient also understands that even with successful revascularization, amputation may still be required due to the severity of the situation.  Risks and benefits are discussed and informed consent is obtained.   Procedure:  The patient was identified and appropriate procedural time out was performed.  The patient was then placed supine on the table and prepped and draped in the usual sterile fashion. Moderate conscious sedation was administered during a face to face encounter with the patient throughout the procedure with my supervision of the RN administering medicines and monitoring the patient's vital signs, pulse oximetry, telemetry and mental status  throughout from the start of the procedure until the patient was taken to the recovery room. Ultrasound was used to evaluate the right common femoral artery.  It was patent .  A digital ultrasound image was acquired.  A Seldinger needle was used to access the right common femoral artery under direct ultrasound guidance and a permanent image was performed.  A 0.035 J wire was advanced without resistance and a 5Fr sheath was placed.  I then crossed the aortic bifurcation and advanced to the left femoral head and then into the left proximal superficial femoral artery. Selective left lower extremity angiogram was then performed. This demonstrated Fairly normal common femoral artery, profunda femoris artery, and mildly diseased superficial femoral artery.  The popliteal artery had to moderate stenosis in relatively close proximity 8 to 10 cm apart.  These were both in the 60% range.  The below-knee popliteal artery then normalized and the peroneal artery was the only runoff distally although the anterior tibial and posterior tibial arteries did reconstitute through large peroneal collaterals in the foot. It was felt that it was in the patient's best interest to proceed with intervention after these images to avoid a second procedure and a larger amount of contrast and fluoroscopy based off of the findings from the initial angiogram. The patient was systemically heparinized and a 6 [place] Ansell sheath was then placed over the [employer] wire. I  then used a Kumpe catheter and the advantage wire to cross the popliteal artery stenoses without difficulty.  I initially performed angioplasty with a 5 mm diameter by 8 cm length Lutonix drug-coated angioplasty balloon inflated twice in the left popliteal artery.  This was slightly undersized so we exchanged for a 6 mm diameter by 12 cm length Lutonix drug-coated angioplasty balloon inflated to 10 atm for 1 minute.  Completion imaging showed improvement with about a 15 to  20% residual stenosis in each lesion. I elected to terminate the procedure. The sheath was removed and StarClose closure device was deployed in the right femoral artery with excellent hemostatic result. The patient was taken to the recovery room in stable condition having tolerated the procedure well.  Findings:                Left lower Extremity: Fairly normal common femoral artery, profunda femoris artery, and mildly diseased superficial femoral artery.  The popliteal artery had to moderate stenosis in relatively close proximity 8 to 10 cm apart.  These were both in the 60% range.  The below-knee popliteal artery then normalized and the peroneal artery was the only runoff distally although the anterior tibial and posterior tibial arteries did reconstitute through large peroneal collaterals in the foot.   Disposition: Patient was taken to the recovery room in stable condition having tolerated the procedure well.  Complications: None  Leotis Pain 04/30/2021 1:27 PM   This note was created with Dragon Medical transcription system. Any errors in dictation are purely unintentional.

## 2021-05-01 DIAGNOSIS — F4322 Adjustment disorder with anxiety: Secondary | ICD-10-CM | POA: Diagnosis not present

## 2021-05-01 LAB — GLUCOSE, CAPILLARY
Glucose-Capillary: 160 mg/dL — ABNORMAL HIGH (ref 70–99)
Glucose-Capillary: 207 mg/dL — ABNORMAL HIGH (ref 70–99)
Glucose-Capillary: 126 mg/dL — ABNORMAL HIGH (ref 70–99)
Glucose-Capillary: 108 mg/dL — ABNORMAL HIGH (ref 70–99)

## 2021-05-01 MED ORDER — SODIUM CHLORIDE 0.9 % IV SOLN
3.0000 g | Freq: Four times a day (QID) | INTRAVENOUS | Status: AC
  Administered 2021-05-01 – 2021-05-04 (×12): 3 g via INTRAVENOUS
  Filled 2021-05-01 (×2): qty 3
  Filled 2021-05-01: qty 8
  Filled 2021-05-01 (×2): qty 3
  Filled 2021-05-01 (×7): qty 8

## 2021-05-01 NOTE — Progress Notes (Signed)
PT Cancellation Note  Patient Details Name: [name] MRN: [id number] DOB: Mar 28, 1956   Cancelled Treatment:    Reason Eval/Treat Not Completed: Pain limiting ability to participate. Pt supine in bed sleeping upon entry. Currently declining participation with PT due to c/o 8/10 R foot pain. Requesting medication; RN notified. Agreeable for PT to come back later today. Will follow up with PT treatment later today as appropriate.    Herminio Commons, PT, DPT 12:07 PM,05/01/21

## 2021-05-01 NOTE — Progress Notes (Addendum)
Physical Therapy Treatment Patient Details Name: [name] MRN: [id number] DOB: [birth year] Today's Date: 05/01/2021   History of Present Illness 65 y.o. female with a past medical history of DM, HTN, hypothyroidism, kidney stones, lymphedema, morbid obesity and plantar fasciitis followed by podiatry outpatient for some chronic wounds and edema in the lower extremity who presents via EMS after a welfare check was made on the patient with neighbors concerned that patient has been stuck in her recliner for several days.  Patient states her feet have been hurting her for over a month got particularly bad over the last couple of days preventing her from standing or walking. Pt underwent R angioplasty and stent placement on 04/27/21.    PT Comments    Pt tolerated treatment well today. It has been a few days since pt has been seen by PT due to procedures and pt declining. Pt required increased assist today (max +1-2) for supine<>sit transfers, bil rolling, and posterior scooting in supine, but was able to maintain static/dynamic sitting at EOB for ~98minutes without LOB. Treatment focused on proximal stability for improved gross balance including: BLE therex, multiplanar reaching outside BOS, and static sitting without BUE support while PT assisted with self care ADL's. While pt did not have LOB, she demonstrates decreased weight shift with reaching (likely due to fear of falling) and decreased BLE AROM during marching and LAQ (likely due to weakness/habitus). Pt required max encouragement at beginning of session for participation due to fear of falling and standing, but became more agreeable by the end of session. Increased time/effort at end of session for bil rolling in supine for linen change. While pt is making progress, she continues to be limited with meeting goals secondary to obesity, decreased activity tolerance, low motivation, generalized weakness, BLE WB precautions, impaired skin integrity,  and decreased balance. Pt will continue to benefit from skilled acute PT services to address deficits for return to baseline function. Will continue to recommend SNF at DC.      Recommendations for follow up therapy are one component of a multi-disciplinary discharge planning process, led by the attending physician.  Recommendations may be updated based on patient status, additional functional criteria and insurance authorization.  Follow Up Recommendations  Skilled nursing-short term rehab (<3 hours/day)     Assistance Recommended at Discharge Frequent or constant Supervision/Assistance  Equipment Recommendations  Other (comment) (defer to post acute)    Recommendations for Other Services       Precautions / Restrictions Precautions Precautions: Fall Restrictions Weight Bearing Restrictions: Yes RLE Weight Bearing: Weight bearing as tolerated LLE Weight Bearing: Non weight bearing Other Position/Activity Restrictions: Per podiatry: "For now can be partial weightbearing with heel contact to the right foot in surgical shoe.  Left foot she has a large heel ulceration so she cannot put weight on the left heel, patient could perhaps toe touch in a surgical shoe but believe this will be difficult to achieve due to body habitus and instability."     Mobility  Bed Mobility Overal bed mobility: Needs Assistance Bed Mobility: Supine to Sit;Sit to Supine Rolling: Max assist   Supine to sit: Max assist;HOB elevated Sit to supine: Max assist;+2 for safety/equipment   General bed mobility comments: multimodal cues for safety/sequencing; increased time/effort for rolling and supine>sit    Transfers                   General transfer comment: deferred secondary to dizziness  Balance Overall balance assessment: Needs assistance Sitting-balance support: Feet supported Sitting balance-Leahy Scale: Fair Sitting balance - Comments: Able to maintain static sitting balance  without BUE support, demonstrating no LOB. Able to perform seated BLE therex at EOB with UUE support, and multiplanar reaching outside BOS on BUE with UUE support. Demonstrates limitations in weight shifting due to fear of falling.                                    Cognition Arousal/Alertness: Awake/alert Behavior During Therapy: Anxious Overall Cognitive Status: No family/caregiver present to determine baseline cognitive functioning                                 General Comments: increased time for processing        Exercises Total Joint Exercises Gluteal Sets: AROM;Strengthening;Both;10 reps;Seated Long Arc Quad: AROM;Strengthening;Both;10 reps;Seated Marching in Standing: AROM;Strengthening;Both;10 reps;Seated Other Exercises Other Exercises: Participated in bed mobility and transfers with encouragement from PT and RN. Required increased assist for mobility, but able to maintain sitting EOB for ~48min while performing BLE therex, static sitting without BUE support (while assisting with self care ADL's), and multiplanar reaching outside BOS with BUE. Other Exercises: Pt educated re: PT role/POC, progress, importance of therapy/participation, OOB mobility, WB precautions, upcoming surgery, and safety with mobility.    General Comments General comments (skin integrity, edema, etc.): drainage noted to chuck pad; chuck pad changed at end of session      Pertinent Vitals/Pain Pain Assessment: No/denies pain     PT Goals (current goals can now be found in the care plan section) Acute Rehab PT Goals Patient Stated Goal: to go home PT Goal Formulation: With patient Time For Goal Achievement: 05/04/21 Potential to Achieve Goals: Poor Progress towards PT goals: Progressing toward goals    Frequency    Min 2X/week      PT Plan Current plan remains appropriate       AM-PAC PT "6 Clicks" Mobility   Outcome Measure  Help needed turning from  your back to your side while in a flat bed without using bedrails?: A Lot Help needed moving from lying on your back to sitting on the side of a flat bed without using bedrails?: A Lot Help needed moving to and from a bed to a chair (including a wheelchair)?: Total Help needed standing up from a chair using your arms (e.g., wheelchair or bedside chair)?: Total Help needed to walk in hospital room?: Total Help needed climbing 3-5 steps with a railing? : Total 6 Click Score: 8    End of Session   Activity Tolerance: Patient limited by fatigue Patient left: in bed;with call bell/phone within reach (bed in chair position) Nurse Communication: Mobility status PT Visit Diagnosis: Muscle weakness (generalized) (M62.81);Difficulty in walking, not elsewhere classified (R26.2);Pain Pain - Right/Left:  (bil feet)     Time: 0981-1914 PT Time Calculation (min) (ACUTE ONLY): 43 min  Charges:  $Therapeutic Activity: 8-22 mins $Neuromuscular Re-education: 23-37 mins                       Herminio Commons, PT, DPT 3:53 PM,05/01/21

## 2021-05-01 NOTE — Progress Notes (Signed)
PROGRESS NOTE    [name]  NXU:[id number] DOB: Sep 03, 1955 DOA: 04/16/2021 PCP: Duanne Limerick, MD    Brief Narrative:  unfortunate 65 year old female with extensive past medical history who was brought in via EMS for evaluation of bilateral lower extremity redness swelling and pain.  Apparently patient has been in her wheelchair at home unable to move for several days.  Lower extremity significant cellulitic changes.  On broad-spectrum antibiotics.  Also some question of fracture in the feet on the right lower extremity.   Podiatry consulted on admission,Concern regarding vascular supply lower extremities.  Podiatry apparently reached out to vascular surgery for consultation prior to any surgical intervention.  Awaiting vascular evaluation to assess lower extremity vasculopathy. WOC following. ABI completed with results significant for severe bilateral peripheral arterial disease.    Angiogram delayed several times due to patient refusal and concerned about capacity.  Angiogram completed on 11/1.  Successful recanalization right lower extremity for limb salvage.  Podiatry is deferring surgical intervention until we have angiography and revascularization attempt on both lower extremities.  Now the right is been completed the patient is apparently refusing left lower extremity angiogram.  Vascular surgery is offered general anesthesia but at this time the patient continues to refuse.  11/4: Patient agreed to angiography this morning.  Notify vascular surgery 11/5: Status post angiography.  Appreciate podiatry follow-up.  Current plan for surgical intervention with Dr. Excell Seltzer on 11/7     Assessment & Plan:  Sepsis Severe bilateral lower extremity cellulitis, nonpurulent Sepsis criteria met with tachycardia and leukocytosis.  Presumed source bilateral lower extremity nonpurulent cellulitis Concern regarding vascular supply Podiatry and vascular consulted MRSA screen negative 7/26  vancomycin and cefepime were discontinued.  Patient was started on Ancef for nonpurulent cellulitis 10/30-sensitivity to wound culture came back we will switch her antibiotics to cefepime as currently resistant to cefazolin. -Angiogram with successful recanalization right lower extremity for limb salvage on 11/1 -Angiogram of left lower extremity successful on 11/4 Plan: Continue IV cefepime with tentative stop date Podiatry planning surgical intervention on Monday 11/7   Acute kidney injury on chronic kidney disease stage IIIb ATN versus prerenal azotemia 10/31 creatinine mildly elevated at 1.51 11/1 improving again with ivf Continue to hold telmisartan Monitor renal function post angiogram Avoid nephrotoxins    Hyperkalemia Improved and remained stable   Hypothyroidism Continue Synthroid   Type 2 diabetes mellitus with peripheral neuropathy Oral medications on hold BG overall stable Continue RISS Continue Neurontin   Essential hypertension Better controlled Continue Toprol, clonidine, amlodipine ARB on hold as above IV hydralazine   Morbid obesity BMI 43.47 This complicates overall care and prognosis Nutrition consult   Poor self-care TOC consult DSS evaluation pending  DVT prophylaxis: SQ Lovenox Code Status: Full Family Communication: None today Disposition Plan: Status is: Inpatient  Remains inpatient appropriate because: Surgical invention with podiatry planned 11/7       Level of care: Med-Surg  Consultants:  Vascular surgery Podiatry  Procedures:  Right lower extremity angiogram 11/1 Left lower extremity angiogram 11/4  Antimicrobials: Cefepime   Subjective: Patient seen and examined.  No status changes over interval.  No pain complaints this morning.  Resting in bed  Objective: Vitals:   04/30/21 1502 04/30/21 1958 05/01/21 0450 05/01/21 0812  BP: (!) 138/56 (!) 115/50 (!) 141/59 (!) 132/56  Pulse: (!) 59 (!) 51 (!) 57 (!) 51   Resp: 14 16 17 12   Temp: 97.9 F (36.6 C) 98.3 F (36.8 C) 97.8  F (36.6 C) 97.9 F (36.6 C)  TempSrc: Oral Oral Oral Oral  SpO2: 99% 98% 96% 97%  Weight:      Height:        Intake/Output Summary (Last 24 hours) at 05/01/2021 1019 Last data filed at 05/01/2021 0450 Gross per 24 hour  Intake 2044.32 ml  Output 360 ml  Net 1684.32 ml   Filed Weights   04/16/21 2244 04/17/21 0403  Weight: 122.5 kg 118.5 kg    Examination:  General exam: No apparent distress Respiratory system: Lungs clear.  Normal work of breathing.  Room air Cardiovascular system: S1-S2, RRR, no murmurs Gastrointestinal system: Obese, NT/ND, normal bowel sounds Central nervous system: Alert and oriented. No focal neurological deficits. Extremities: Decreased power symmetrically.  Bilateral lower extremities and boots in surgical dressings Skin: Significant bilateral lower extremity cellulitis Psychiatry: Judgement and insight appear normal. Mood & affect appropriate.     Data Reviewed: I have personally reviewed following labs and imaging studies  CBC: Recent Labs  Lab 04/26/21 0424 04/29/21 0749  WBC 10.1 9.0  NEUTROABS  --  6.2  HGB 10.3* 9.9*  HCT 32.7* 32.4*  MCV 95.1 98.5  PLT 345 409   Basic Metabolic Panel: Recent Labs  Lab 04/25/21 0414 04/26/21 0424 04/27/21 0558 04/29/21 0749 04/30/21 0528  NA 137 135 134* 137 137  K 4.0 4.0 4.1 4.3 4.2  CL 112* 110 108 111 112*  CO2 19* 21* 20* 20* 19*  GLUCOSE 165* 199* 187* 206* 175*  BUN $Re'19 23 22 20 21  'pcF$ CREATININE 1.25* 1.51* 1.36* 1.40* 1.33*  CALCIUM 7.7* 7.8* 8.3* 8.2* 8.2*   GFR: Estimated Creatinine Clearance: 52.9 mL/min (A) (by C-G formula based on SCr of 1.33 mg/dL (H)). Liver Function Tests: No results for input(s): AST, ALT, ALKPHOS, BILITOT, PROT, ALBUMIN in the last 168 hours. No results for input(s): LIPASE, AMYLASE in the last 168 hours. No results for input(s): AMMONIA in the last 168 hours. Coagulation Profile: No  results for input(s): INR, PROTIME in the last 168 hours. Cardiac Enzymes: No results for input(s): CKTOTAL, CKMB, CKMBINDEX, TROPONINI in the last 168 hours. BNP (last 3 results) No results for input(s): PROBNP in the last 8760 hours. HbA1C: No results for input(s): HGBA1C in the last 72 hours. CBG: Recent Labs  Lab 04/30/21 1131 04/30/21 1211 04/30/21 1638 04/30/21 2116 05/01/21 0801  GLUCAP 174* 154* 177* 209* 160*   Lipid Profile: No results for input(s): CHOL, HDL, LDLCALC, TRIG, CHOLHDL, LDLDIRECT in the last 72 hours. Thyroid Function Tests: No results for input(s): TSH, T4TOTAL, FREET4, T3FREE, THYROIDAB in the last 72 hours. Anemia Panel: No results for input(s): VITAMINB12, FOLATE, FERRITIN, TIBC, IRON, RETICCTPCT in the last 72 hours. Sepsis Labs: No results for input(s): PROCALCITON, LATICACIDVEN in the last 168 hours.  Recent Results (from the past 240 hour(s))  Aerobic/Anaerobic Culture w Gram Stain (surgical/deep wound)     Status: None   Collection Time: 04/21/21  6:20 PM   Specimen: Wound  Result Value Ref Range Status   Specimen Description   Final    WOUND Performed at [facility], [address], [place], [place] [address]    Special Requests   Final    NONE Performed at [facility] [address], [place] [place], [place] [address]    Gram Stain NO WBC SEEN NO ORGANISMS SEEN   Final   Culture   Final    ABUNDANT PROTEUS VULGARIS FEW PREVOTELLA SPECIES BETA LACTAMASE POSITIVE Performed  at [facility], [place] [address], [place], [place] [address]    Report Status 04/27/2021 FINAL  Final   Organism ID, Bacteria PROTEUS VULGARIS  Final      Susceptibility   Proteus vulgaris - MIC*    AMPICILLIN >=32 RESISTANT Resistant     CEFAZOLIN >=64 RESISTANT Resistant     CEFEPIME <=0.12 SENSITIVE Sensitive     CEFTAZIDIME <=1 SENSITIVE Sensitive     CIPROFLOXACIN <=0.25 SENSITIVE Sensitive     GENTAMICIN <=1 SENSITIVE Sensitive      IMIPENEM 2 SENSITIVE Sensitive     TRIMETH/SULFA <=20 SENSITIVE Sensitive     AMPICILLIN/SULBACTAM 8 SENSITIVE Sensitive     PIP/TAZO <=4 SENSITIVE Sensitive     * ABUNDANT PROTEUS VULGARIS         Radiology Studies: PERIPHERAL VASCULAR CATHETERIZATION  Result Date: 04/30/2021 See surgical note for result.       Scheduled Meds:  acidophilus  2 capsule Oral Daily   amLODipine  5 mg Oral Daily   atorvastatin  20 mg Oral Daily   cloNIDine  0.3 mg Oral TID   clopidogrel  75 mg Oral Q breakfast   enoxaparin (LOVENOX) injection  0.5 mg/kg Subcutaneous Q24H   gabapentin  300 mg Oral BID   hydrocerin   Topical Daily   insulin aspart  0-15 Units Subcutaneous TID WC   insulin aspart  0-5 Units Subcutaneous QHS   levothyroxine  112 mcg Oral Q0600   metoprolol succinate  50 mg Oral Q breakfast   sodium chloride flush  3 mL Intravenous Q12H   Continuous Infusions:  sodium chloride 75 mL/hr at 05/01/21 0358   ceFEPime (MAXIPIME) IV 2 g (05/01/21 1008)   metronidazole Stopped (05/01/21 0044)     LOS: 14 days    Time spent: 25 minutes    Sidney Ace, MD [facility]   If 7PM-7AM, please contact night-coverage  05/01/2021, 10:19 AM

## 2021-05-02 DIAGNOSIS — F4322 Adjustment disorder with anxiety: Secondary | ICD-10-CM | POA: Diagnosis not present

## 2021-05-02 LAB — GLUCOSE, CAPILLARY
Glucose-Capillary: 148 mg/dL — ABNORMAL HIGH (ref 70–99)
Glucose-Capillary: 148 mg/dL — ABNORMAL HIGH (ref 70–99)
Glucose-Capillary: 135 mg/dL — ABNORMAL HIGH (ref 70–99)
Glucose-Capillary: 112 mg/dL — ABNORMAL HIGH (ref 70–99)

## 2021-05-02 MED ORDER — POVIDONE-IODINE 10 % EX SWAB: 2.0000 "application " | Freq: Once | CUTANEOUS | Status: DC

## 2021-05-02 MED ORDER — CHLORHEXIDINE GLUCONATE 4 % EX LIQD: 60.0000 mL | Freq: Once | CUTANEOUS | Status: DC

## 2021-05-02 NOTE — Progress Notes (Signed)
PROGRESS NOTE    [name]  RDE:[id number] DOB: 12/22/1955 DOA: 04/16/2021 PCP: Juline Patch, MD    Brief Narrative:  unfortunate 65 year old female with extensive past medical history who was brought in via EMS for evaluation of bilateral lower extremity redness swelling and pain.  Apparently patient has been in her wheelchair at home unable to move for several days.  Lower extremity significant cellulitic changes.  On broad-spectrum antibiotics.  Also some question of fracture in the feet on the right lower extremity.   Podiatry consulted on admission,Concern regarding vascular supply lower extremities.  Podiatry apparently reached out to vascular surgery for consultation prior to any surgical intervention.  Awaiting vascular evaluation to assess lower extremity vasculopathy. WOC following. ABI completed with results significant for severe bilateral peripheral arterial disease.    Angiogram delayed several times due to patient refusal and concerned about capacity.  Angiogram completed on 11/1.  Successful recanalization right lower extremity for limb salvage.  Podiatry is deferring surgical intervention until we have angiography and revascularization attempt on both lower extremities.  Now the right is been completed the patient is apparently refusing left lower extremity angiogram.  Vascular surgery is offered general anesthesia but at this time the patient continues to refuse.  11/4: Patient agreed to angiography this morning.  Notify vascular surgery 11/5: Status post angiography.  Appreciate podiatry follow-up.  Current plan for surgical intervention with Dr. Luana Shu on 11/7     Assessment & Plan:  Sepsis Severe bilateral lower extremity cellulitis, nonpurulent Sepsis criteria met with tachycardia and leukocytosis.  Presumed source bilateral lower extremity nonpurulent cellulitis Concern regarding vascular supply Podiatry and vascular consulted MRSA screen negative 7/26  vancomycin and cefepime were discontinued.  Patient was started on Ancef for nonpurulent cellulitis 10/30-sensitivity to wound culture came back we will switch her antibiotics to cefepime as currently resistant to cefazolin. -Angiogram with successful recanalization right lower extremity for limb salvage on 11/1 -Angiogram of left lower extremity successful on 11/4 Plan: Continue IV cefepime with tentative stop date Podiatry planning surgical intervention on Monday 11/7 Surgery planned tomorrow afternoon.  N.p.o. after 8 AM   Acute kidney injury on chronic kidney disease stage IIIb ATN versus prerenal azotemia 10/31 creatinine mildly elevated at 1.51 11/1 improving again with ivf Continue to hold telmisartan Monitor renal function post angiogram Avoid nephrotoxins    Hyperkalemia Improved and remained stable   Hypothyroidism Continue Synthroid   Type 2 diabetes mellitus with peripheral neuropathy Oral medications on hold BG overall stable Continue RISS Continue Neurontin   Essential hypertension Better controlled Continue Toprol, clonidine, amlodipine ARB on hold as above IV hydralazine   Morbid obesity BMI 63.14 This complicates overall care and prognosis Nutrition consult   Poor self-care TOC consult DSS evaluation pending  DVT prophylaxis: SQ Lovenox Code Status: Full Family Communication: None today Disposition Plan: Status is: Inpatient  Remains inpatient appropriate because: Surgical invention with podiatry planned 11/7       Level of care: Med-Surg  Consultants:  Vascular surgery Podiatry  Procedures:  Right lower extremity angiogram 11/1 Left lower extremity angiogram 11/4  Antimicrobials: Cefepime   Subjective: Patient seen and examined.  No status changes over interval.  No pain complaints this morning.  Resting in bed  Objective: Vitals:   05/01/21 2022 05/01/21 2301 05/02/21 0410 05/02/21 0742  BP: (!) 127/56  (!) 129/58 (!)  157/85  Pulse: (!) 56 68 (!) 55 73  Resp: _0 Temp: (!) 97.5 F (  36.4 C)  97.7 F (36.5 C) 97.6 F (36.4 C)  TempSrc: Oral  Oral Oral  SpO2: 98%  98% 100%  Weight:      Height:        Intake/Output Summary (Last 24 hours) at 05/02/2021 1138 Last data filed at 05/02/2021 1045 Gross per 24 hour  Intake 1254.76 ml  Output --  Net 1254.76 ml   Filed Weights   04/16/21 2244 04/17/21 0403  Weight: 122.5 kg 118.5 kg    Examination:  General exam: No apparent distress Respiratory system: Lungs clear.  Normal work of breathing.  Room air Cardiovascular system: S1-S2, RRR, no murmurs Gastrointestinal system: Obese, NT/ND, normal bowel sounds Central nervous system: Alert and oriented. No focal neurological deficits. Extremities: Decreased power symmetrically.  Bilateral lower extremities and boots in surgical dressings Skin: Significant bilateral lower extremity cellulitis Psychiatry: Judgement and insight appear normal. Mood & affect appropriate.     Data Reviewed: I have personally reviewed following labs and imaging studies  CBC: Recent Labs  Lab 04/26/21 0424 04/29/21 0749  WBC 10.1 9.0  NEUTROABS  --  6.2  HGB 10.3* 9.9*  HCT 32.7* 32.4*  MCV 95.1 98.5  PLT 345 372   Basic Metabolic Panel: Recent Labs  Lab 04/26/21 0424 04/27/21 0558 04/29/21 0749 04/30/21 0528  NA 135 134* 137 137  K 4.0 4.1 4.3 4.2  CL 110 108 111 112*  CO2 21* 20* 20* 19*  GLUCOSE 199* 187* 206* 175*  BUN _0 CREATININE 1.51* 1.36* 1.40* 1.33*  CALCIUM 7.8* 8.3* 8.2* 8.2*   GFR: Estimated Creatinine Clearance: 52.9 mL/min (A) (by C-G formula based on SCr of 1.33 mg/dL (H)). Liver Function Tests: No results for input(s): AST, ALT, ALKPHOS, BILITOT, PROT, ALBUMIN in the last 168 hours. No results for input(s): LIPASE, AMYLASE in the last 168 hours. No results for input(s): AMMONIA in the last 168 hours. Coagulation Profile: No results for input(s): INR, PROTIME in  the last 168 hours. Cardiac Enzymes: No results for input(s): CKTOTAL, CKMB, CKMBINDEX, TROPONINI in the last 168 hours. BNP (last 3 results) No results for input(s): PROBNP in the last 8760 hours. HbA1C: No results for input(s): HGBA1C in the last 72 hours. CBG: Recent Labs  Lab 05/01/21 1147 05/01/21 1720 05/01/21 2226 05/02/21 0744 05/02/21 1124  GLUCAP 207* 126* 108* 148* 148*   Lipid Profile: No results for input(s): CHOL, HDL, LDLCALC, TRIG, CHOLHDL, LDLDIRECT in the last 72 hours. Thyroid Function Tests: No results for input(s): TSH, T4TOTAL, FREET4, T3FREE, THYROIDAB in the last 72 hours. Anemia Panel: No results for input(s): VITAMINB12, FOLATE, FERRITIN, TIBC, IRON, RETICCTPCT in the last 72 hours. Sepsis Labs: No results for input(s): PROCALCITON, LATICACIDVEN in the last 168 hours.  No results found for this or any previous visit (from the past 240 hour(s)).        Radiology Studies: PERIPHERAL VASCULAR CATHETERIZATION  Result Date: 04/30/2021 See surgical note for result.       Scheduled Meds:  acidophilus  2 capsule Oral Daily   amLODipine  5 mg Oral Daily   atorvastatin  20 mg Oral Daily   cloNIDine  0.3 mg Oral TID   clopidogrel  75 mg Oral Q breakfast   enoxaparin (LOVENOX) injection  0.5 mg/kg Subcutaneous Q24H   hydrocerin   Topical Daily   insulin aspart  0-15 Units Subcutaneous TID WC   insulin aspart  0-5 Units Subcutaneous QHS   levothyroxine  112 mcg Oral  Q0600   metoprolol succinate  50 mg Oral Q breakfast   sodium chloride flush  3 mL Intravenous Q12H   Continuous Infusions:  ampicillin-sulbactam (UNASYN) IV 3 g (05/02/21 1043)     LOS: 15 days    Time spent: 15 minutes    Sidney Ace, MD [facility]   If 7PM-7AM, please contact night-coverage  05/02/2021, 11:38 AM

## 2021-05-03 ENCOUNTER — Inpatient Hospital Stay: Payer: Medicare HMO | Admitting: Anesthesiology

## 2021-05-03 ENCOUNTER — Encounter: Payer: Self-pay | Admitting: Family Medicine

## 2021-05-03 ENCOUNTER — Encounter
Admission: EM | Disposition: A | Discharge status: Skilled Nursing Facility | Payer: Self-pay | Source: Home / Self Care | Attending: Internal Medicine

## 2021-05-03 DIAGNOSIS — F4322 Adjustment disorder with anxiety: Secondary | ICD-10-CM | POA: Diagnosis not present

## 2021-05-03 HISTORY — PX: IRRIGATION AND DEBRIDEMENT FOOT: SHX6602

## 2021-05-03 HISTORY — PX: AMPUTATION: SHX166

## 2021-05-03 LAB — BASIC METABOLIC PANEL
Anion gap: 8 (ref 5–15)
BUN: 20 mg/dL (ref 8–23)
CO2: 20 mmol/L — ABNORMAL LOW (ref 22–32)
Calcium: 8.3 mg/dL — ABNORMAL LOW (ref 8.9–10.3)
Chloride: 112 mmol/L — ABNORMAL HIGH (ref 98–111)
Creatinine, Ser: 1.52 mg/dL — ABNORMAL HIGH (ref 0.44–1.00)
GFR, Estimated: 37 mL/min — ABNORMAL LOW (ref >60)
Glucose, Bld: 160 mg/dL — ABNORMAL HIGH (ref 70–99)
Potassium: 4.7 mmol/L (ref 3.5–5.1)
Sodium: 140 mmol/L (ref 135–145)

## 2021-05-03 LAB — CBC WITH DIFFERENTIAL/PLATELET
Abs Immature Granulocytes: 0.05 10*3/uL (ref 0.00–0.07)
Basophils Absolute: 0.1 10*3/uL (ref 0.0–0.1)
Basophils Relative: 1 %
Eosinophils Absolute: 0.4 10*3/uL (ref 0.0–0.5)
Eosinophils Relative: 5 %
HCT: 30.1 % — ABNORMAL LOW (ref 36.0–46.0)
Hemoglobin: 9.2 g/dL — ABNORMAL LOW (ref 12.0–15.0)
Immature Granulocytes: 1 %
Lymphocytes Relative: 12 %
Lymphs Abs: 1 10*3/uL (ref 0.7–4.0)
MCH: 30.2 pg (ref 26.0–34.0)
MCHC: 30.6 g/dL (ref 30.0–36.0)
MCV: 98.7 fL (ref 80.0–100.0)
Monocytes Absolute: 0.6 10*3/uL (ref 0.1–1.0)
Monocytes Relative: 7 %
Neutro Abs: 6 10*3/uL (ref 1.7–7.7)
Neutrophils Relative %: 74 %
Platelets: 327 10*3/uL (ref 150–400)
RBC: 3.05 MIL/uL — ABNORMAL LOW (ref 3.87–5.11)
RDW: 18.5 % — ABNORMAL HIGH (ref 11.5–15.5)
WBC: 8.1 10*3/uL (ref 4.0–10.5)
nRBC: 0 % (ref 0.0–0.2)

## 2021-05-03 LAB — PROTIME-INR
INR: 1.5 — ABNORMAL HIGH (ref 0.8–1.2)
Prothrombin Time: 17.7 seconds — ABNORMAL HIGH (ref 11.4–15.2)

## 2021-05-03 LAB — GLUCOSE, CAPILLARY
Glucose-Capillary: 140 mg/dL — ABNORMAL HIGH (ref 70–99)
Glucose-Capillary: 122 mg/dL — ABNORMAL HIGH (ref 70–99)
Glucose-Capillary: 106 mg/dL — ABNORMAL HIGH (ref 70–99)
Glucose-Capillary: 105 mg/dL — ABNORMAL HIGH (ref 70–99)
Glucose-Capillary: 101 mg/dL — ABNORMAL HIGH (ref 70–99)
Glucose-Capillary: 114 mg/dL — ABNORMAL HIGH (ref 70–99)
Glucose-Capillary: 116 mg/dL — ABNORMAL HIGH (ref 70–99)

## 2021-05-03 SURGERY — AMPUTATION, FOOT, RAY
Anesthesia: General | Site: Toe | Laterality: Right

## 2021-05-03 MED ORDER — PROPOFOL 10 MG/ML IV BOLUS
INTRAVENOUS | Status: DC | PRN
  Administered 2021-05-03: 120 mg via INTRAVENOUS

## 2021-05-03 MED ORDER — VANCOMYCIN HCL 1000 MG IV SOLR
INTRAVENOUS | Status: AC
  Filled 2021-05-03: qty 20

## 2021-05-03 MED ORDER — ACETAMINOPHEN 10 MG/ML IV SOLN: 1000.0000 mg | Freq: Once | INTRAVENOUS | Status: DC | PRN

## 2021-05-03 MED ORDER — ONDANSETRON HCL 4 MG/2ML IJ SOLN
INTRAMUSCULAR | Status: AC
  Filled 2021-05-03: qty 2

## 2021-05-03 MED ORDER — DEXMEDETOMIDINE (PRECEDEX) IN NS 20 MCG/5ML (4 MCG/ML) IV SYRINGE
PREFILLED_SYRINGE | INTRAVENOUS | Status: AC
  Filled 2021-05-03: qty 5

## 2021-05-03 MED ORDER — FENTANYL CITRATE (PF) 100 MCG/2ML IJ SOLN
INTRAMUSCULAR | Status: AC
  Filled 2021-05-03: qty 2

## 2021-05-03 MED ORDER — SODIUM CHLORIDE 0.9 % IV SOLN
INTRAVENOUS | Status: DC
Start: 2021-05-03 — End: 2021-05-05

## 2021-05-03 MED ORDER — LIDOCAINE HCL (PF) 1 % IJ SOLN: INTRAMUSCULAR | Status: DC | PRN

## 2021-05-03 MED ORDER — SODIUM CHLORIDE 0.9 % IR SOLN
Status: DC | PRN
  Administered 2021-05-03: 1000 mL

## 2021-05-03 MED ORDER — KETAMINE HCL 50 MG/5ML IJ SOSY
PREFILLED_SYRINGE | INTRAMUSCULAR | Status: AC
  Filled 2021-05-03: qty 5

## 2021-05-03 MED ORDER — SODIUM CHLORIDE 0.9 % IR SOLN
Status: DC | PRN
  Administered 2021-05-03: 300 mL

## 2021-05-03 MED ORDER — LIDOCAINE HCL (CARDIAC) PF 100 MG/5ML IV SOSY
PREFILLED_SYRINGE | INTRAVENOUS | Status: DC | PRN
  Administered 2021-05-03: 60 mg via INTRAVENOUS

## 2021-05-03 MED ORDER — KETAMINE HCL 10 MG/ML IJ SOLN
INTRAMUSCULAR | Status: DC | PRN
  Administered 2021-05-03 (×2): 20 mg via INTRAVENOUS
  Administered 2021-05-03: 10 mg via INTRAVENOUS

## 2021-05-03 MED ORDER — BUPIVACAINE HCL (PF) 0.5 % IJ SOLN
INTRAMUSCULAR | Status: AC
  Filled 2021-05-03: qty 30

## 2021-05-03 MED ORDER — HYDROMORPHONE HCL 1 MG/ML IJ SOLN
INTRAMUSCULAR | Status: DC | PRN
  Administered 2021-05-03 (×2): .5 mg via INTRAVENOUS

## 2021-05-03 MED ORDER — GLYCOPYRROLATE 0.2 MG/ML IJ SOLN
INTRAMUSCULAR | Status: DC | PRN
Start: 2021-05-03 — End: 2021-05-03
  Administered 2021-05-03: .1 mg via INTRAVENOUS

## 2021-05-03 MED ORDER — NEOMYCIN-POLYMYXIN B GU 40-200000 IR SOLN
Status: AC
  Filled 2021-05-03: qty 2

## 2021-05-03 MED ORDER — GLYCOPYRROLATE 0.2 MG/ML IJ SOLN
INTRAMUSCULAR | Status: AC
  Filled 2021-05-03: qty 1

## 2021-05-03 MED ORDER — DEXMEDETOMIDINE HCL IN NACL 400 MCG/100ML IV SOLN
INTRAVENOUS | Status: DC | PRN
  Administered 2021-05-03: 8 ug via INTRAVENOUS

## 2021-05-03 MED ORDER — LIDOCAINE HCL (PF) 2 % IJ SOLN
INTRAMUSCULAR | Status: AC
  Filled 2021-05-03: qty 5

## 2021-05-03 MED ORDER — ROCURONIUM BROMIDE 100 MG/10ML IV SOLN
INTRAVENOUS | Status: DC | PRN
  Administered 2021-05-03: 50 mg via INTRAVENOUS

## 2021-05-03 MED ORDER — FENTANYL CITRATE (PF) 100 MCG/2ML IJ SOLN
INTRAMUSCULAR | Status: AC
  Administered 2021-05-03: 50 ug via INTRAVENOUS
  Filled 2021-05-03: qty 2

## 2021-05-03 MED ORDER — PROMETHAZINE HCL 25 MG/ML IJ SOLN: 6.2500 mg | INTRAMUSCULAR | Status: DC | PRN

## 2021-05-03 MED ORDER — OXYCODONE HCL 5 MG/5ML PO SOLN: 5.0000 mg | Freq: Once | ORAL | Status: DC | PRN

## 2021-05-03 MED ORDER — PROPOFOL 10 MG/ML IV BOLUS
INTRAVENOUS | Status: AC
  Filled 2021-05-03: qty 20

## 2021-05-03 MED ORDER — ONDANSETRON HCL 4 MG/2ML IJ SOLN
INTRAMUSCULAR | Status: DC | PRN
  Administered 2021-05-03: 4 mg via INTRAVENOUS

## 2021-05-03 MED ORDER — SUGAMMADEX SODIUM 200 MG/2ML IV SOLN
INTRAVENOUS | Status: DC | PRN
  Administered 2021-05-03: 200 mg via INTRAVENOUS

## 2021-05-03 MED ORDER — MIDAZOLAM HCL 2 MG/2ML IJ SOLN
INTRAMUSCULAR | Status: AC
  Filled 2021-05-03: qty 2

## 2021-05-03 MED ORDER — MIDAZOLAM HCL 2 MG/2ML IJ SOLN
INTRAMUSCULAR | Status: DC | PRN
  Administered 2021-05-03 (×2): 1 mg via INTRAVENOUS

## 2021-05-03 MED ORDER — SUCCINYLCHOLINE CHLORIDE 200 MG/10ML IV SOSY
PREFILLED_SYRINGE | INTRAVENOUS | Status: AC
  Filled 2021-05-03: qty 10

## 2021-05-03 MED ORDER — LIDOCAINE HCL (PF) 1 % IJ SOLN
INTRAMUSCULAR | Status: AC
  Filled 2021-05-03: qty 30

## 2021-05-03 MED ORDER — FENTANYL CITRATE (PF) 100 MCG/2ML IJ SOLN
25.0000 ug | INTRAMUSCULAR | Status: DC | PRN
  Administered 2021-05-03 (×2): 50 ug via INTRAVENOUS

## 2021-05-03 MED ORDER — OXYCODONE HCL 5 MG PO TABS: 5.0000 mg | ORAL_TABLET | Freq: Once | ORAL | Status: DC | PRN

## 2021-05-03 MED ORDER — PHENYLEPHRINE HCL (PRESSORS) 10 MG/ML IV SOLN
INTRAVENOUS | Status: DC | PRN
  Administered 2021-05-03: 80 ug via INTRAVENOUS

## 2021-05-03 MED ORDER — HYDROMORPHONE HCL 1 MG/ML IJ SOLN
INTRAMUSCULAR | Status: AC
  Filled 2021-05-03: qty 1

## 2021-05-03 MED ORDER — SUCCINYLCHOLINE CHLORIDE 200 MG/10ML IV SOSY
PREFILLED_SYRINGE | INTRAVENOUS | Status: DC | PRN
  Administered 2021-05-03: 140 mg via INTRAVENOUS

## 2021-05-03 SURGICAL SUPPLY — 68 items
BAG COUNTER SPONGE SURGICOUNT (BAG) ×4 IMPLANT
BLADE MED AGGRESSIVE (BLADE) ×4 IMPLANT
BLADE OSC/SAGITTAL MD 5.5X18 (BLADE) IMPLANT
BLADE OSCILLATING/SAGITTAL (BLADE)
BLADE SURG 15 STRL LF DISP TIS (BLADE) ×3 IMPLANT
BLADE SURG 15 STRL SS (BLADE) ×1
BLADE SURG MINI STRL (BLADE) IMPLANT
BLADE SW THK.38XMED LNG THN (BLADE) IMPLANT
BNDG COHESIVE 6X5 TAN ST LF (GAUZE/BANDAGES/DRESSINGS) ×4 IMPLANT
BNDG CONFORM 2 STRL LF (GAUZE/BANDAGES/DRESSINGS) IMPLANT
BNDG CONFORM 3 STRL LF (GAUZE/BANDAGES/DRESSINGS) IMPLANT
BNDG ELASTIC 3X5.8 VLCR NS LF (GAUZE/BANDAGES/DRESSINGS) IMPLANT
BNDG ELASTIC 4X5.8 VLCR NS LF (GAUZE/BANDAGES/DRESSINGS) IMPLANT
BNDG ELASTIC 4X5.8 VLCR STR LF (GAUZE/BANDAGES/DRESSINGS) ×8 IMPLANT
BNDG ESMARK 4X12 TAN STRL LF (GAUZE/BANDAGES/DRESSINGS) ×4 IMPLANT
BNDG GAUZE ELAST 4 BULKY (GAUZE/BANDAGES/DRESSINGS) ×8 IMPLANT
CNTNR SPEC 2.5X3XGRAD LEK (MISCELLANEOUS) ×12
CONT SPEC 4OZ STER OR WHT (MISCELLANEOUS) ×4
CONTAINER SPEC 2.5X3XGRAD LEK (MISCELLANEOUS) ×12 IMPLANT
COVER BACK TABLE REUSABLE LG (DRAPES) ×4 IMPLANT
CUFF TOURN SGL QUICK 12 (TOURNIQUET CUFF) IMPLANT
CUFF TOURN SGL QUICK 18X4 (TOURNIQUET CUFF) IMPLANT
DRAPE FLUOR MINI C-ARM 54X84 (DRAPES) IMPLANT
DURAPREP 26ML APPLICATOR (WOUND CARE) ×4 IMPLANT
ELECT REM PT RETURN 9FT ADLT (ELECTROSURGICAL) ×4
ELECTRODE REM PT RTRN 9FT ADLT (ELECTROSURGICAL) ×3 IMPLANT
GAUZE 4X4 16PLY ~~LOC~~+RFID DBL (SPONGE) ×4 IMPLANT
GAUZE PACKING 1/4 X5 YD (GAUZE/BANDAGES/DRESSINGS) IMPLANT
GAUZE PACKING IODOFORM 1X5 (PACKING) IMPLANT
GAUZE SPONGE 4X4 12PLY STRL (GAUZE/BANDAGES/DRESSINGS) ×16 IMPLANT
GAUZE XEROFORM 1X8 LF (GAUZE/BANDAGES/DRESSINGS) ×8 IMPLANT
GAUZE XEROFORM 4X4 STRL (GAUZE/BANDAGES/DRESSINGS) ×4 IMPLANT
GAUZE XEROFORM 5X9 LF (GAUZE/BANDAGES/DRESSINGS) ×8 IMPLANT
GLOVE SURG ENC MOIS LTX SZ7 (GLOVE) ×4 IMPLANT
GLOVE SURG UNDER LTX SZ7 (GLOVE) ×4 IMPLANT
GOWN STRL REUS W/ TWL LRG LVL3 (GOWN DISPOSABLE) ×6 IMPLANT
GOWN STRL REUS W/TWL LRG LVL3 (GOWN DISPOSABLE) ×2
HANDPIECE VERSAJET DEBRIDEMENT (MISCELLANEOUS) ×4 IMPLANT
HEMOSTAT SURGICEL 2X14 (HEMOSTASIS) ×4 IMPLANT
IV NS 1000ML (IV SOLUTION)
IV NS 1000ML BAXH (IV SOLUTION) IMPLANT
IV NS IRRIG 3000ML ARTHROMATIC (IV SOLUTION) IMPLANT
KIT TURNOVER KIT A (KITS) ×4 IMPLANT
LABEL OR SOLS (LABEL) ×4 IMPLANT
MANIFOLD NEPTUNE II (INSTRUMENTS) ×4 IMPLANT
NEEDLE FILTER BLUNT 18X 1/2SAF (NEEDLE) ×1
NEEDLE FILTER BLUNT 18X1 1/2 (NEEDLE) ×3 IMPLANT
NEEDLE HYPO 25X1 1.5 SAFETY (NEEDLE) ×8 IMPLANT
NS IRRIG 500ML POUR BTL (IV SOLUTION) ×4 IMPLANT
PACK EXTREMITY ARMC (MISCELLANEOUS) ×4 IMPLANT
PAD ABD DERMACEA PRESS 5X9 (GAUZE/BANDAGES/DRESSINGS) ×16 IMPLANT
PULSAVAC PLUS IRRIG FAN TIP (DISPOSABLE)
RASP SM TEAR CROSS CUT (RASP) IMPLANT
SOL PREP PVP 2OZ (MISCELLANEOUS) ×4
SOLUTION PREP PVP 2OZ (MISCELLANEOUS) ×3 IMPLANT
STOCKINETTE STRL 6IN 960660 (GAUZE/BANDAGES/DRESSINGS) ×4 IMPLANT
SUT ETHILON 3-0 FS-10 30 BLK (SUTURE) ×12
SUT ETHILON 4-0 (SUTURE)
SUT ETHILON 4-0 FS2 18XMFL BLK (SUTURE)
SUT VIC AB 3-0 SH 27 (SUTURE) ×1
SUT VIC AB 3-0 SH 27X BRD (SUTURE) ×3 IMPLANT
SUT VIC AB 4-0 FS2 27 (SUTURE) IMPLANT
SUTURE EHLN 3-0 FS-10 30 BLK (SUTURE) ×9 IMPLANT
SUTURE ETHLN 4-0 FS2 18XMF BLK (SUTURE) IMPLANT
SWAB CULTURE AMIES ANAERIB BLU (MISCELLANEOUS) IMPLANT
SYR 10ML LL (SYRINGE) ×4 IMPLANT
TIP FAN IRRIG PULSAVAC PLUS (DISPOSABLE) IMPLANT
WATER STERILE IRR 500ML POUR (IV SOLUTION) ×4 IMPLANT

## 2021-05-03 NOTE — Op Note (Signed)
PODIATRY / FOOT AND ANKLE SURGERY OPERATIVE REPORT    SURGEON: Caroline More, DPM  PRE-OPERATIVE DIAGNOSIS:  1.  Right posterior lower leg ulceration 2.  Left posterior lower leg ulceration 3.  Osteomyelitis with associated wounds and cellulitis right hallux and right fifth ray 4.  Left calcaneal pressure ulceration 5.  Diabetes type 2 polyneuropathy 6.  PVD 7.  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  Right hallux and fifth metatarsal osteomyelitis 2.  Necrotic left heel ulceration into muscle, no bone exposed 3.  Subcutaneous ulceration to the posterior aspect of both lower legs  PATHOLOGY/SPECIMEN(S): Right hallux and fifth ray  INDICATIONS:   JALYIAH [name] is a 65 y.o. female who presents with multiple ulcerations to bilateral lower extremities.  Patient has had chronic bedsores present to the left heel, posterior lower legs, right distal hallux and right fifth ray.  Patient has had failure to thrive overall and was seen in her house due to a general wellness check and was noted to be surrounded by large amounts of trash and fecal matter as well as soiled materials.  Patient was sent to the emergency room for further work-up and was noted to have these ulcerations and osteomyelitis present to the right hallux and right fifth ray and concern for osteomyelitis to the left heel.  Patient has been worked up from a vascular standpoint and revascularization procedures have been performed.   Discussed all treatment options with the patient both conservative and surgical attempts at correction including potential risks and complications and patient is elected for procedure described above.  No guarantees given to patient.  Consent obtained prior to procedure.  Patient is at high risk for limb loss.  DESCRIPTION: After obtaining full informed written consent, the patient was brought back to the operating room and placed supine upon the operating table.  The patient received IV antibiotics prior to induction.  After obtaining adequate anesthesia, the patient was prepped and draped in the standard fashion.  An Esmarch bandage was used to exsanguinate the right foot at the level of the ankle.  Attention was then directed to the right distal hallux where a fishmouth type of incision was made at the PIPJ creating dorsal and plantar flaps.  The IPJ hallux was visualized and an extensor tenotomy and capsulotomy was performed followed by release the collateral ligaments and flexor tendon attachment and the distal phalanx was disarticulated and passed off the operative site and sent off to pathology.  The flexor and extensor tendons were cut as far proximally as possible.  The surgical site was flushed with copious amounts normal sterile saline.  The skin was then reapproximated well coapted with 3-0 nylon in simple type stitching.  Minimal bleeding occurred overall.  Attention was then directed to the right fifth ray where a racquet type of incision was made around the fifth ray encompassing the ulceration present to the plantar lateral aspect of the fifth metatarsal phalangeal joint excising the ulceration and excising the ulcerated area between the fourth and fifth toes  as well as the dorsal aspect of the PIPJ of the fifth toe.  This incision was made straight to bone.  At this time the fifth metatarsal phalangeal joint was identified and an extensor tenotomy and capsulotomy was performed followed by  release of the collateral and suspensory ligaments as well as plantar plate and flexor tendon.  The fifth toe was disarticulated and passed off in the operative site.  Circumferential dissection was then performed around the fifth metatarsal head to the level of the distal shaft.  At this time a sagittal bone saw was used to resect the fifth metatarsal head at the level of the distal shaft/neck area with the appropriate beveling.  The fifth metatarsal bone piece was then passed off in the operative site and sent off to pathology with the toe.  No further evidence of osteomyelitis or infection remain.  Large amount of serous drainage was present through this area due to patient's large volume of swelling present.  Minimal bleeding occurred overall.  The surgical site was flushed with copious amounts normal sterile saline.  The subcutaneous tissue was then reapproximated well coapted with 3-0 Vicryl and the skin was then reapproximated well coapted with 3-0 nylon in a combination of simple and horizontal mattress type stitching.  The Esmarch bandage was removed to the right lower extremity and a prompt hyperemic response was noted all digits of the right foot, unfortunately the plantar flap of the right hallux appeared to be somewhat delayed with capillary fill time but we will continue to monitor postoperatively.  Hemostasis appeared to be well controlled.  Attention was then directed to the posterior aspects of both lower legs.  100% excisional subcutaneous wound debridement was performed to both wounds present with the Versajet removing fibrous tissue and biofilm 100% granular tissue.  The right posterior lower leg wound predebridement and postdebridement measured approximately 6 cm x 6 cm x 0.1 cm.  The left posterior lower leg wound predebridement and postdebridement measured approximately 3 cm x 3 cm x 0.1 cm.  Hemostasis was achieved with compression.  Attention was then directed to the plantar and  posterior left heel.  100% excisional wound debridement was performed into muscular tissue with combination of 15 blade and Versajet.  No bone appeared to be exposed.  Wound predebridement measured 5 cm x 5 cm by unknown, postdebridement measured 5 cm x 5 cm x 2 cm.  Hemostasis was achieved with compression but also with Surgicel which was applied.  Postoperative dressings were applied to the right foot consisting of Xeroform to the incision lines followed by 4 x 4 gauze, Xeroform to the posterior lower leg wound, 4 x 4 gauze, ABD, Kerlix, Ace wrap.  Postoperative dressing was applied the left foot consisting of Xeroform to the wounds followed by 4 x 4 gauze, ABD, Kerlix, Ace wrap.  Patient tolerated the procedure and anesthesia well was transferred to recovery room vital signs stable and vascular status appearing to be somewhat intact both feet despite peripheral vascular disease.  Patient will be discharged back to the inpatient room with the appropriate orders and instructions.  Patient to have wound VAC placed to the left heel.  Patient has a bleak outlook overall as far as limb salvage.  We will continue to treat with local wound care but patient is at high risk for limb loss.   COMPLICATIONS: None  CONDITION: Good, stable  Caroline More, DPM

## 2021-05-03 NOTE — Anesthesia Procedure Notes (Addendum)
Procedure Name: Intubation Date/Time: 05/03/2021 5:45 PM Performed by: Kerri Perches, CRNA Pre-anesthesia Checklist: Patient identified, Patient being monitored, Timeout performed, Emergency Drugs available and Suction available Patient Re-evaluated:Patient Re-evaluated prior to induction Oxygen Delivery Method: Circle system utilized Preoxygenation: Pre-oxygenation with 100% oxygen Induction Type: IV induction Ventilation: Mask ventilation without difficulty Laryngoscope Size: 3 and McGraph Grade View: Grade I Tube type: Oral Tube size: 7.0 mm Number of attempts: 1 Airway Equipment and Method: Stylet Placement Confirmation: ETT inserted through vocal cords under direct vision, positive ETCO2 and breath sounds checked- equal and bilateral Secured at: 21 cm Tube secured with: Tape Dental Injury: Teeth and Oropharynx as per pre-operative assessment  Comments: Teeth untouched and left in preanesthetic condition

## 2021-05-03 NOTE — Progress Notes (Signed)
Pre-op report called and given to Aldona Bar, RN in [employer] All questions answered to satisfaction. OR personnel to transport pt.

## 2021-05-03 NOTE — Plan of Care (Signed)

## 2021-05-03 NOTE — Progress Notes (Signed)
OT Cancellation Note  Patient Details Name: [name] MRN: [id number] DOB: Mar 12, 1956   Cancelled Treatment:    Reason Eval/Treat Not Completed: Patient at procedure or test/ unavailable. Per chart review, pt with scheduled surgical intervention with podiatry this date. Per therapy protocols, pt will require new orders or continue at transfer to initiate therapy services. Will follow acutely and initiate services as pt medically appropriate.  Fredirick Maudlin, OTR/L [place]

## 2021-05-03 NOTE — Anesthesia Preprocedure Evaluation (Addendum)
Anesthesia Evaluation  Patient identified by MRN, date of birth, ID band Patient awake    Reviewed: Allergy & Precautions, NPO status , Patient's Chart, lab work & pertinent test results  History of Anesthesia Complications Negative for: history of anesthetic complications  Airway Mallampati: III  TM Distance: >3 FB Neck ROM: Full    Dental  (+) Missing, Poor Dentition, Loose,    Pulmonary neg pulmonary ROS,    Pulmonary exam normal        Cardiovascular hypertension (BP medications held due to AKI), Pt. on home beta blockers + Peripheral Vascular Disease   Rhythm:Regular  ECG 04/16/21:  Sinus tachycardia (HR 121) Multiple premature complexes, vent & supraven Anterior infarct, old Nonspecific T abnormalities, lateral leads   Neuro/Psych PSYCHIATRIC DISORDERS Anxiety negative neurological ROS     GI/Hepatic negative GI ROS,   Endo/Other  diabetes, Well Controlled, Type 2, Oral Hypoglycemic AgentsHypothyroidism obesity  Renal/GU CRFRenal disease (nephrolithiasis, AKI on stage III CKD)     Musculoskeletal  (+) Arthritis ,   Abdominal (+) + obese,   Peds  Hematology  (+) Blood dyscrasia, anemia ,   Anesthesia Other Findings Sepsis 2/2 LE cellulitis on IV cefepime   Reproductive/Obstetrics                           Anesthesia Physical  Anesthesia Plan  ASA: 3  Anesthesia Plan: General   Post-op Pain Management:    Induction: Intravenous  PONV Risk Score and Plan: 3 and Treatment may vary due to age or medical condition and Ondansetron  Airway Management Planned: Oral ETT  Additional Equipment:   Intra-op Plan:   Post-operative Plan: Extubation in OR  Informed Consent: I have reviewed the patients History and Physical, chart, labs and discussed the procedure including the risks, benefits and alternatives for the proposed anesthesia with the patient or authorized representative  who has indicated his/her understanding and acceptance.     Dental advisory given  Plan Discussed with: CRNA and Anesthesiologist  Anesthesia Plan Comments:       Anesthesia Quick Evaluation

## 2021-05-03 NOTE — Progress Notes (Signed)
PROGRESS NOTE    [name]  TFT:[id number] DOB: Dec 22, 1955 DOA: 04/16/2021 PCP: Juline Patch, MD    Brief Narrative:  unfortunate 65 year old female with extensive past medical history who was brought in via EMS for evaluation of bilateral lower extremity redness swelling and pain.  Apparently patient has been in her wheelchair at home unable to move for several days.  Lower extremity significant cellulitic changes.  On broad-spectrum antibiotics.  Also some question of fracture in the feet on the right lower extremity.   Podiatry consulted on admission,Concern regarding vascular supply lower extremities.  Podiatry apparently reached out to vascular surgery for consultation prior to any surgical intervention.  Awaiting vascular evaluation to assess lower extremity vasculopathy. WOC following. ABI completed with results significant for severe bilateral peripheral arterial disease.    Angiogram delayed several times due to patient refusal and concerned about capacity.  Angiogram completed on 11/1.  Successful recanalization right lower extremity for limb salvage.  Podiatry is deferring surgical intervention until we have angiography and revascularization attempt on both lower extremities.  Now the right is been completed the patient is apparently refusing left lower extremity angiogram.  Vascular surgery is offered general anesthesia but at this time the patient continues to refuse.  11/4: Patient agreed to angiography this morning.  Notify vascular surgery 11/5: Status post angiography.  Appreciate podiatry follow-up.  Current plan for surgical intervention with Dr. Luana Shu on 11/7 11/7: N.p.o. for debridement with podiatry today.  Patient aware of surgical plan and in agreement.  N.p.o.     Assessment & Plan:  Sepsis Severe bilateral lower extremity cellulitis, nonpurulent Sepsis criteria met with tachycardia and leukocytosis.  Presumed source bilateral lower extremity nonpurulent  cellulitis Concern regarding vascular supply Podiatry and vascular consulted MRSA screen negative 7/26 vancomycin and cefepime were discontinued.  Patient was started on Ancef for nonpurulent cellulitis 10/30-sensitivity to wound culture came back we will switch her antibiotics to cefepime as currently resistant to cefazolin. -Angiogram with successful recanalization right lower extremity for limb salvage on 11/1 -Angiogram of left lower extremity successful on 11/4 Plan: Continue IV cefepime with tentative stop date Podiatry planning surgical intervention on Monday 11/7 n.p.o. for surgical invention with podiatry today   Acute kidney injury on chronic kidney disease stage IIIb ATN versus prerenal azotemia 10/31 creatinine mildly elevated at 1.51 11/1 improving again with ivf 11/7: Renal function slightly worse Plan: Continue to hold telmisartan IV fluids while n.p.o. Avoid nephrotoxins    Hyperkalemia Improved and remained stable   Hypothyroidism Continue Synthroid   Type 2 diabetes mellitus with peripheral neuropathy Oral medications on hold BG overall stable Continue RISS Continue Neurontin   Essential hypertension Better controlled Continue Toprol, clonidine, amlodipine ARB on hold as above IV hydralazine   Morbid obesity BMI 70.62 This complicates overall care and prognosis Nutrition consult   Poor self-care TOC consult DSS evaluation pending  DVT prophylaxis: SQ Lovenox Code Status: Full Family Communication: None today Disposition Plan: Status is: Inpatient  Remains inpatient appropriate because: Surgical invention with podiatry planned 11/7       Level of care: Med-Surg  Consultants:  Vascular surgery Podiatry  Procedures:  Right lower extremity angiogram 11/1 Left lower extremity angiogram 11/4 Surgical debridement 11/7  Antimicrobials: Cefepime   Subjective: Patient seen and examined.  No status changes over interval.  No pain  complaints this morning.  Resting in bed  Objective: Vitals:   05/02/21 1519 05/02/21 2003 05/03/21 0450 05/03/21 0808  BP: (!) 125/51 Marland Kitchen)  125/54 (!) 146/63 (!) 153/63  Pulse: (!) 54 (!) 52 63 (!) 57  Resp: $Remo'16 16 15 16  'wskGD$ Temp: 97.9 F (36.6 C) 98.6 F (37 C) 98.5 F (36.9 C) 99.1 F (37.3 C)  TempSrc: Oral  Oral Oral  SpO2: 98%  97% 96%  Weight:      Height:        Intake/Output Summary (Last 24 hours) at 05/03/2021 1012 Last data filed at 05/03/2021 0450 Gross per 24 hour  Intake 640 ml  Output 350 ml  Net 290 ml   Filed Weights   04/16/21 2244 04/17/21 0403  Weight: 122.5 kg 118.5 kg    Examination:  General exam: No acute distress.  Resting in bed Respiratory system: No adventitious sounds.  Normal work of breathing.  Room air Cardiovascular system: S1-S2, RRR, no murmurs Gastrointestinal system: Obese, NT/ND, normal bowel sounds Central nervous system: Alert and oriented. No focal neurological deficits. Extremities: Decreased power symmetrically.  Bilateral lower extremities and boots in surgical dressings Skin: Significant bilateral lower extremity cellulitis Psychiatry: Judgement and insight appear normal. Mood & affect appropriate.     Data Reviewed: I have personally reviewed following labs and imaging studies  CBC: Recent Labs  Lab 04/29/21 0749 05/03/21 0752  WBC 9.0 8.1  NEUTROABS 6.2 6.0  HGB 9.9* 9.2*  HCT 32.4* 30.1*  MCV 98.5 98.7  PLT 301 628   Basic Metabolic Panel: Recent Labs  Lab 04/27/21 0558 04/29/21 0749 04/30/21 0528 05/03/21 0752  NA 134* 137 137 140  K 4.1 4.3 4.2 4.7  CL 108 111 112* 112*  CO2 20* 20* 19* 20*  GLUCOSE 187* 206* 175* 160*  BUN $Re'22 20 21 20  'WgT$ CREATININE 1.36* 1.40* 1.33* 1.52*  CALCIUM 8.3* 8.2* 8.2* 8.3*   GFR: Estimated Creatinine Clearance: 46.3 mL/min (A) (by C-G formula based on SCr of 1.52 mg/dL (H)). Liver Function Tests: No results for input(s): AST, ALT, ALKPHOS, BILITOT, PROT, ALBUMIN in the  last 168 hours. No results for input(s): LIPASE, AMYLASE in the last 168 hours. No results for input(s): AMMONIA in the last 168 hours. Coagulation Profile: Recent Labs  Lab 05/03/21 0752  INR 1.5*   Cardiac Enzymes: No results for input(s): CKTOTAL, CKMB, CKMBINDEX, TROPONINI in the last 168 hours. BNP (last 3 results) No results for input(s): PROBNP in the last 8760 hours. HbA1C: No results for input(s): HGBA1C in the last 72 hours. CBG: Recent Labs  Lab 05/02/21 0744 05/02/21 1124 05/02/21 1616 05/02/21 2137 05/03/21 0806  GLUCAP 148* 148* 135* 112* 140*   Lipid Profile: No results for input(s): CHOL, HDL, LDLCALC, TRIG, CHOLHDL, LDLDIRECT in the last 72 hours. Thyroid Function Tests: No results for input(s): TSH, T4TOTAL, FREET4, T3FREE, THYROIDAB in the last 72 hours. Anemia Panel: No results for input(s): VITAMINB12, FOLATE, FERRITIN, TIBC, IRON, RETICCTPCT in the last 72 hours. Sepsis Labs: No results for input(s): PROCALCITON, LATICACIDVEN in the last 168 hours.  No results found for this or any previous visit (from the past 240 hour(s)).        Radiology Studies: No results found.      Scheduled Meds:  acidophilus  2 capsule Oral Daily   amLODipine  5 mg Oral Daily   atorvastatin  20 mg Oral Daily   chlorhexidine  60 mL Topical Once   cloNIDine  0.3 mg Oral TID   clopidogrel  75 mg Oral Q breakfast   enoxaparin (LOVENOX) injection  0.5 mg/kg Subcutaneous Q24H   hydrocerin  Topical Daily   insulin aspart  0-15 Units Subcutaneous TID WC   insulin aspart  0-5 Units Subcutaneous QHS   levothyroxine  112 mcg Oral Q0600   metoprolol succinate  50 mg Oral Q breakfast   povidone-iodine  2 application Topical Once   sodium chloride flush  3 mL Intravenous Q12H   Continuous Infusions:  sodium chloride     ampicillin-sulbactam (UNASYN) IV 3 g (05/03/21 0452)     LOS: 16 days    Time spent: 25 minutes    Sidney Ace, MD [facility]   If 7PM-7AM, please contact night-coverage  05/03/2021, 10:12 AM

## 2021-05-03 NOTE — Progress Notes (Signed)
PT Cancellation Note  Patient Details Name: MULAN ADAN MRN: [id number] DOB: 02-06-1956   Cancelled Treatment:    Reason Eval/Treat Not Completed: Other (comment): Per chart review, pt with scheduled surgical intervention with podiatry this date. Per therapy protocols, pt will require new or continue at transfer orders to continue with therapy services. Will follow acutely and initiate services as pt medically appropriate   D. Royetta Asal PT, DPT 05/03/21, 2:39 PM

## 2021-05-03 NOTE — H&P (Signed)
HISTORY AND PHYSICAL INTERVAL NOTE:  05/03/2021  4:44 PM  [name]  has presented today for surgery, with the diagnosis of osteomyelitis right hallux and 5th toe, possibly calcaneus, left heel ulcer, right and left posterior leg ulcerations.  The various methods of treatment have been discussed with the patient.  No guarantees were given.  After consideration of risks, benefits and other options for treatment, the patient has consented to surgery.  I have reviewed the patients' chart and labs.    PROCEDURE: RIGHT HALLUX PARTIAL AMPUTATION RIGHT PARTIAL 5TH RAY AMPUTATION RIGHT AND LEFT POSTERIOR LOWER LEG ULCERATION DEBRIDEMENTS LEFT HEEL ULCERATION DEBRIDEMENT, BONE BIOPSY, ANTIBIOTIC BEAD APPLICATION, POSSIBLE WOUND VAC PLACEMENT   A history and physical examination was performed in the hospital.  The patient was reexamined.  There have been no changes to this history and physical examination.  Caroline More, DPM

## 2021-05-03 NOTE — Transfer of Care (Signed)
Immediate Anesthesia Transfer of Care Note  Patient: [name]  Procedure(s) Performed: AMPUTATION RAY (Right: Toe) IRRIGATION AND DEBRIDEMENT FOOT (Bilateral: Foot) BONE BIOPSY (Left: Foot)  Patient Location: PACU  Anesthesia Type:General  Level of Consciousness: awake and alert   Airway & Oxygen Therapy: Patient Spontanous Breathing and Patient connected to face mask oxygen  Post-op Assessment: Report given to RN and Post -op Vital signs reviewed and stable  Post vital signs: Reviewed and stable  Last Vitals:  Vitals Value Taken Time  BP 201/92 05/03/21 1920  Temp    Pulse 85 05/03/21 1925  Resp 21 05/03/21 1925  SpO2 100 % 05/03/21 1925  Vitals shown include unvalidated device data.  Last Pain:  Vitals:   05/03/21 1640  TempSrc: Oral  PainSc: 0-No pain      Patients Stated Pain Goal: 3 (05/12/51 0802)  Complications: No notable events documented.

## 2021-05-03 NOTE — Anesthesia Postprocedure Evaluation (Signed)
Anesthesia Post Note  Patient: [name]  Procedure(s) Performed: AMPUTATION RAY (Right: Toe) IRRIGATION AND DEBRIDEMENT FEET (Bilateral: Foot)  Patient location during evaluation: PACU Anesthesia Type: General Level of consciousness: awake and alert Pain management: pain level controlled Vital Signs Assessment: post-procedure vital signs reviewed and stable Respiratory status: spontaneous breathing, nonlabored ventilation and respiratory function stable Cardiovascular status: blood pressure returned to baseline and stable Postop Assessment: no apparent nausea or vomiting Anesthetic complications: no   No notable events documented.   Last Vitals:  Vitals:   05/03/21 2027 05/03/21 2105  BP: (!) 156/59 (!) 138/57  Pulse: 71 65  Resp: 18 18  Temp: (!) 36.3 C 36.4 C  SpO2: 95% 97%    Last Pain:  Vitals:   05/03/21 2027  TempSrc: Oral  PainSc: [place]

## 2021-05-04 ENCOUNTER — Encounter: Payer: Self-pay | Admitting: Podiatry

## 2021-05-04 DIAGNOSIS — F4322 Adjustment disorder with anxiety: Secondary | ICD-10-CM | POA: Diagnosis not present

## 2021-05-04 LAB — CBC WITH DIFFERENTIAL/PLATELET
Abs Immature Granulocytes: 0.04 10*3/uL (ref 0.00–0.07)
Basophils Absolute: 0.1 10*3/uL (ref 0.0–0.1)
Basophils Relative: 1 %
Eosinophils Absolute: 0.4 10*3/uL (ref 0.0–0.5)
Eosinophils Relative: 4 %
HCT: 30.1 % — ABNORMAL LOW (ref 36.0–46.0)
Hemoglobin: 8.9 g/dL — ABNORMAL LOW (ref 12.0–15.0)
Immature Granulocytes: 1 %
Lymphocytes Relative: 13 %
Lymphs Abs: 1.2 10*3/uL (ref 0.7–4.0)
MCH: 29.5 pg (ref 26.0–34.0)
MCHC: 29.6 g/dL — ABNORMAL LOW (ref 30.0–36.0)
MCV: 99.7 fL (ref 80.0–100.0)
Monocytes Absolute: 0.8 10*3/uL (ref 0.1–1.0)
Monocytes Relative: 9 %
Neutro Abs: 6.2 10*3/uL (ref 1.7–7.7)
Neutrophils Relative %: 72 %
Platelets: 331 10*3/uL (ref 150–400)
RBC: 3.02 MIL/uL — ABNORMAL LOW (ref 3.87–5.11)
RDW: 18.6 % — ABNORMAL HIGH (ref 11.5–15.5)
WBC: 8.6 10*3/uL (ref 4.0–10.5)
nRBC: 0 % (ref 0.0–0.2)

## 2021-05-04 LAB — GLUCOSE, CAPILLARY
Glucose-Capillary: 127 mg/dL — ABNORMAL HIGH (ref 70–99)
Glucose-Capillary: 155 mg/dL — ABNORMAL HIGH (ref 70–99)
Glucose-Capillary: 139 mg/dL — ABNORMAL HIGH (ref 70–99)
Glucose-Capillary: 125 mg/dL — ABNORMAL HIGH (ref 70–99)

## 2021-05-04 LAB — BASIC METABOLIC PANEL
Anion gap: 8 (ref 5–15)
BUN: 19 mg/dL (ref 8–23)
CO2: 20 mmol/L — ABNORMAL LOW (ref 22–32)
Calcium: 8.2 mg/dL — ABNORMAL LOW (ref 8.9–10.3)
Chloride: 112 mmol/L — ABNORMAL HIGH (ref 98–111)
Creatinine, Ser: 1.52 mg/dL — ABNORMAL HIGH (ref 0.44–1.00)
GFR, Estimated: 37 mL/min — ABNORMAL LOW (ref >60)
Glucose, Bld: 146 mg/dL — ABNORMAL HIGH (ref 70–99)
Potassium: 4.5 mmol/L (ref 3.5–5.1)
Sodium: 140 mmol/L (ref 135–145)

## 2021-05-04 NOTE — Progress Notes (Signed)
PROGRESS NOTE    [name]  MGQ:[id number] DOB: 12-Jan-1956 DOA: 04/16/2021 PCP: Juline Patch, MD    Brief Narrative:  unfortunate 65 year old female with extensive past medical history who was brought in via EMS for evaluation of bilateral lower extremity redness swelling and pain.  Apparently patient has been in her wheelchair at home unable to move for several days.  Lower extremity significant cellulitic changes.  On broad-spectrum antibiotics.  Also some question of fracture in the feet on the right lower extremity.   Podiatry consulted on admission,Concern regarding vascular supply lower extremities.  Podiatry apparently reached out to vascular surgery for consultation prior to any surgical intervention.  Awaiting vascular evaluation to assess lower extremity vasculopathy. WOC following. ABI completed with results significant for severe bilateral peripheral arterial disease.    Angiogram delayed several times due to patient refusal and concerned about capacity.  Angiogram completed on 11/1.  Successful recanalization right lower extremity for limb salvage.  Podiatry is deferring surgical intervention until we have angiography and revascularization attempt on both lower extremities.  Now the right is been completed the patient is apparently refusing left lower extremity angiogram.  Vascular surgery is offered general anesthesia but at this time the patient continues to refuse.  11/4: Patient agreed to angiography this morning.  Notify vascular surgery 11/5: Status post angiography.  Appreciate podiatry follow-up.  Current plan for surgical intervention with Dr. Luana Shu on 11/7 11/7: N.p.o. for debridement with podiatry today.  Patient aware of surgical plan and in agreement.  N.p.o. 11/8: Postop day 1 status post extensive debridement and wound VAC placement     Assessment & Plan:  Sepsis Severe bilateral lower extremity cellulitis, nonpurulent Sepsis criteria met with  tachycardia and leukocytosis.  Presumed source bilateral lower extremity nonpurulent cellulitis Concern regarding vascular supply Podiatry and vascular consulted MRSA screen negative 7/26 vancomycin and cefepime were discontinued.  Patient was started on Ancef for nonpurulent cellulitis 10/30-sensitivity to wound culture came back we will switch her antibiotics to cefepime as currently resistant to cefazolin. -Angiogram with successful recanalization right lower extremity for limb salvage on 11/1 -Angiogram of left lower extremity successful on 11/4 -Status post extensive debridement and wound VAC placement with podiatry on 11/7 Plan: Continue IV cefepime with tentative stop date Postoperative wound care Podiatry follow-up   Acute kidney injury on chronic kidney disease stage IIIb ATN versus prerenal azotemia 10/31 creatinine mildly elevated at 1.51 11/1 improving again with ivf 11/7: Renal function slightly worse Plan: Continue to hold telmisartan Continue IV fluids Avoid nephrotoxins    Hyperkalemia Improved and remained stable   Hypothyroidism Continue Synthroid   Type 2 diabetes mellitus with peripheral neuropathy Oral medications on hold BG overall stable Continue RISS Continue Neurontin   Essential hypertension Better controlled Continue Toprol, clonidine, amlodipine ARB on hold as above IV hydralazine   Morbid obesity BMI 26.71 This complicates overall care and prognosis Nutrition consult   Poor self-care TOC consult DSS evaluation Will need SNF, [facility] after medical stabilization  DVT prophylaxis: SQ Lovenox Code Status: Full Family Communication: None today Disposition Plan: Status is: Inpatient  Remains inpatient appropriate because: Postop day 1 status post extensive debridement lower extremities.       Level of care: Med-Surg  Consultants:  Vascular surgery Podiatry  Procedures:  Right lower extremity angiogram  11/1 Left lower extremity angiogram 11/4 Surgical debridement 11/7  Antimicrobials: Unasyn   Subjective: Patient seen and examined.  Complaining of pain in lower extremities.  Objective: Vitals:   05/03/21 2105 05/03/21 2156 05/04/21 0337 05/04/21 0747  BP: (!) 138/57 (!) 151/51 (!) 152/59 (!) 157/58  Pulse: 65 78 73 70  Resp: _0 Temp: 97.6 F (36.4 C) (!) 97.3 F (36.3 C) 98.4 F (36.9 C) 97.8 F (36.6 C)  TempSrc:  Oral Oral Oral  SpO2: 97% 97% 95% 100%  Weight:      Height:        Intake/Output Summary (Last 24 hours) at 05/04/2021 1126 Last data filed at 05/04/2021 1048 Gross per 24 hour  Intake 2419.61 ml  Output 300 ml  Net 2119.61 ml   Filed Weights   04/16/21 2244 04/17/21 0403 05/03/21 1640  Weight: 122.5 kg 118.5 kg 118.5 kg    Examination:  General exam: Mild distress due to pain Respiratory system: No adventitious sounds.  Normal work of breathing.  Room air Cardiovascular system: S1-S2, RRR, no murmurs Gastrointestinal system: Obese, NT/ND, normal bowel sounds Central nervous system: Alert and oriented. No focal neurological deficits. Extremities: Decreased power symmetrically.  Bilateral lower extremities in surgical wraps.  Not removed.  Skin: Significant bilateral lower extremity cellulitis Psychiatry: Judgement and insight appear normal. Mood & affect appropriate.     Data Reviewed: I have personally reviewed following labs and imaging studies  CBC: Recent Labs  Lab 04/29/21 0749 05/03/21 0752 05/04/21 0729  WBC 9.0 8.1 8.6  NEUTROABS 6.2 6.0 6.2  HGB 9.9* 9.2* 8.9*  HCT 32.4* 30.1* 30.1*  MCV 98.5 98.7 99.7  PLT 301 327 741   Basic Metabolic Panel: Recent Labs  Lab 04/29/21 0749 04/30/21 0528 05/03/21 0752 05/04/21 0729  NA 137 137 140 140  K 4.3 4.2 4.7 4.5  CL 111 112* 112* 112*  CO2 20* 19* 20* 20*  GLUCOSE 206* 175* 160* 146*  BUN _1 CREATININE 1.40* 1.33* 1.52* 1.52*  CALCIUM 8.2* 8.2* 8.3* 8.2*    GFR: Estimated Creatinine Clearance: 46.3 mL/min (A) (by C-G formula based on SCr of 1.52 mg/dL (H)). Liver Function Tests: No results for input(s): AST, ALT, ALKPHOS, BILITOT, PROT, ALBUMIN in the last 168 hours. No results for input(s): LIPASE, AMYLASE in the last 168 hours. No results for input(s): AMMONIA in the last 168 hours. Coagulation Profile: Recent Labs  Lab 05/03/21 0752  INR 1.5*   Cardiac Enzymes: No results for input(s): CKTOTAL, CKMB, CKMBINDEX, TROPONINI in the last 168 hours. BNP (last 3 results) No results for input(s): PROBNP in the last 8760 hours. HbA1C: No results for input(s): HGBA1C in the last 72 hours. CBG: Recent Labs  Lab 05/03/21 1650 05/03/21 1928 05/03/21 2055 05/03/21 2102 05/04/21 0747  GLUCAP 105* 101* 114* 116* 127*   Lipid Profile: No results for input(s): CHOL, HDL, LDLCALC, TRIG, CHOLHDL, LDLDIRECT in the last 72 hours. Thyroid Function Tests: No results for input(s): TSH, T4TOTAL, FREET4, T3FREE, THYROIDAB in the last 72 hours. Anemia Panel: No results for input(s): VITAMINB12, FOLATE, FERRITIN, TIBC, IRON, RETICCTPCT in the last 72 hours. Sepsis Labs: No results for input(s): PROCALCITON, LATICACIDVEN in the last 168 hours.  No results found for this or any previous visit (from the past 240 hour(s)).        Radiology Studies: No results found.      Scheduled Meds:  acidophilus  2 capsule Oral Daily   amLODipine  5 mg Oral Daily   atorvastatin  20 mg Oral Daily   cloNIDine  0.3 mg Oral TID   clopidogrel  75 mg Oral Q breakfast   enoxaparin (LOVENOX) injection  0.5 mg/kg Subcutaneous Q24H   hydrocerin   Topical Daily   insulin aspart  0-15 Units Subcutaneous TID WC   insulin aspart  0-5 Units Subcutaneous QHS   levothyroxine  112 mcg Oral Q0600   metoprolol succinate  50 mg Oral Q breakfast   sodium chloride flush  3 mL Intravenous Q12H   Continuous Infusions:  sodium chloride 75 mL/hr at 05/04/21 0416    ampicillin-sulbactam (UNASYN) IV 3 g (05/04/21 0824)     LOS: 17 days    Time spent: 25 minutes    Sidney Ace, MD [facility]   If 7PM-7AM, please contact night-coverage  05/04/2021, 11:26 AM

## 2021-05-04 NOTE — Progress Notes (Signed)
Physical Therapy Treatment Patient Details Name: [name] MRN: [id number] DOB: [birth year] Today's Date: 05/04/2021   History of Present Illness 65 y.o. female with a past medical history of DM, HTN, hypothyroidism, kidney stones, lymphedema, morbid obesity and plantar fasciitis followed by podiatry outpatient for some chronic wounds and edema in the lower extremity who presents via EMS after a welfare check was made on the patient with neighbors concerned that patient has been stuck in her recliner for several days.  Patient states her feet have been hurting her for over a month got particularly bad over the last couple of days preventing her from standing or walking. Pt underwent R angioplasty and stent placement on 04/27/21.    PT Comments    Pt was not motivated to participate during the session, but was willing to do ther ex in bed. Pt's SpO2 was 96% and HR was 63 upon entering the room. Pt declined sitting EOB and standing even after PT explanation on benefits of mobility. Current goals remain appropriate. Pt will benefit from a trial of PT services in a SNF setting upon discharge to safely address deficits listed in patient problem list for decreased caregiver assistance and eventual return to PLOF.    Recommendations for follow up therapy are one component of a multi-disciplinary discharge planning process, led by the attending physician.  Recommendations may be updated based on patient status, additional functional criteria and insurance authorization.  Follow Up Recommendations  Skilled nursing-short term rehab (<3 hours/day)     Assistance Recommended at Discharge Frequent or constant Supervision/Assistance  Equipment Recommendations  Other (comment) (defer to post acute)    Recommendations for Other Services       Precautions / Restrictions Precautions Precautions: Fall Restrictions Weight Bearing Restrictions: Yes RLE Weight Bearing: Partial weight bearing RLE  Partial Weight Bearing Percentage or Pounds: PWB to RLE with post op shoe with heel weight bearing only LLE Weight Bearing: Non weight bearing LLE Partial Weight Bearing Percentage or Pounds: Toe touch with post op shoe with transfers only     Mobility  Bed Mobility               General bed mobility comments: NT, pt declined Patient Response: Flat affect  Transfers                   General transfer comment: NT, pt declined    Ambulation/Gait               General Gait Details: NT, pt declined   Stairs             Wheelchair Mobility    Modified Rankin (Stroke Patients Only)       Balance       Sitting balance - Comments: NT, pt declined sitting EOB       Standing balance comment: NT, pt declined                            Cognition Arousal/Alertness: Awake/alert Behavior During Therapy: Flat affect Overall Cognitive Status: No family/caregiver present to determine baseline cognitive functioning                                 General Comments: increased time for processing        Exercises Total Joint Exercises Ankle Circles/Pumps: AROM;Both;10 reps;Supine;Strengthening Quad Sets: AROM;Strengthening;Both;10 reps;Supine Gluteal Sets: AROM;Strengthening;Both;10 reps;Supine  Towel Squeeze: AROM;Strengthening;Both;10 reps;Supine (with pillow) Other Exercises Other Exercises: Education on benefits of mobility    General Comments        Pertinent Vitals/Pain Pain Assessment: No/denies pain Pain Score: 0-No pain    Home Living                          Prior Function            PT Goals (current goals can now be found in the care plan section) Acute Rehab PT Goals Patient Stated Goal: to go home PT Goal Formulation: With patient Time For Goal Achievement: 05/17/21 Potential to Achieve Goals: Fair Progress towards PT goals: PT to reassess next treatment    Frequency    Min  2X/week      PT Plan Current plan remains appropriate    Co-evaluation              AM-PAC PT "6 Clicks" Mobility   Outcome Measure  Help needed turning from your back to your side while in a flat bed without using bedrails?: A Lot Help needed moving from lying on your back to sitting on the side of a flat bed without using bedrails?: A Lot Help needed moving to and from a bed to a chair (including a wheelchair)?: Total Help needed standing up from a chair using your arms (e.g., wheelchair or bedside chair)?: Total Help needed to walk in hospital room?: Total Help needed climbing 3-5 steps with a railing? : Total 6 Click Score: 8    End of Session     Patient left: in bed;with call bell/phone within reach;Other (comment) (reapplied prevalon boots) Nurse Communication: Mobility status PT Visit Diagnosis: Muscle weakness (generalized) (M62.81);Difficulty in walking, not elsewhere classified (R26.2);Pain     Time: 0213-0234 PT Time Calculation (min) (ACUTE ONLY): 21 min  Charges:                        Sheldon Silvan SPT 05/04/21, 3:18 PM

## 2021-05-04 NOTE — Consult Note (Addendum)
[place] Nurse Consult Note: Patient receiving care in [facility]. Primary RN, [place], present for dressing removal to left heel. Reason for Consult: VAC application to left heel Wound type: surgical Pressure Injury POA: Yes/No/NA Measurement: Per surgical MD note 5 cm x 5 cm x 2 cm Wound bed: Surgicel over wound bed--no bleeding occurred from wound when existing dressing taken down to the level of the Surgicel. Surgicel remained in wound bed then I placed the piece of black foam over it. Drainage (amount, consistency, odor) to be determined Periwound: intact Dressing procedure/placement/frequency: One piece of black foam placed over the left heel surgicel, then drape, then suction applied. Immediate seal obtained. Dressing lightly secured with Ace wrap.  Due to the straightforward nature of the dressing, I have turned the dressing changes over to the primary nurse.  To begin Thursday of this week.  I also modified the VAC order to Tuesday, Thursday, Saturday since there was not a WOC in house to apply the [facility] yesterday.  Discussed plan of care with the bedside nurse.  [place] nurse will not follow at this time.  Please re-consult the [place] team if needed.  Val Riles, RN, MSN, CWOCN, CNS-BC, pager [phone]

## 2021-05-04 NOTE — Progress Notes (Signed)
F/u right and left foot surgery.  C/o pain left >right  Wounds stable.  Dressing changed to right foot and left leg.  Wound vac left intact.  Will continued to follow.  Skin flaps are stable for now but pt still at risk of further tissue loss.

## 2021-05-04 NOTE — Progress Notes (Signed)
Occupational Therapy Treatment Patient Details Name: [name] MRN: [id number] DOB: 1955/10/19 Today's Date: 05/04/2021   History of present illness 65 y.o. female with a past medical history of DM, HTN, hypothyroidism, kidney stones, lymphedema, morbid obesity and plantar fasciitis followed by podiatry outpatient for some chronic wounds and edema in the lower extremity who presents via EMS after a welfare check was made on the patient with neighbors concerned that patient has been stuck in her recliner for several days.  Patient states her feet have been hurting her for over a month got particularly bad over the last couple of days preventing her from standing or walking. Pt underwent R angioplasty and stent placement on 04/27/21. On 05/03/21, pt underwent right posterior lower leg ulceration wound debridement, left lower leg ulceration wound debridement, left heel wound debridement, right partial hallux amputation, and right partial fifth ray amputation.   OT comments  Ms. Pancake seen today for re-evaluation, 1 day post surgery for wound debridement on L LE and R partial hallux and partial fifth ray amputation, with goals of care updated. Pt continues to be reluctant to engage in OOB mobility, stating that 10/10 pain in L foot prevented her from touching foot to floor. She was willing, however, to attempt bed mobility and supine therapeutic activity. She required Max A +2 for supine<>sit, crying out with complaints of pain at multiple points. She was able to maintain sitting balance for ~ 3 minutes with Max A + 1 before requesting return to supine. Pt unable to provide any assistance with moving LE; however, used b/l UE to assist in bed mobility and scooting towards HOB. Recommend ongoing OT while hospitalized to assist pt in regaining fxl mobility. DC to SNF remains appropriate.    Recommendations for follow up therapy are one component of a multi-disciplinary discharge planning process, led by the  attending physician.  Recommendations may be updated based on patient status, additional functional criteria and insurance authorization.    Follow Up Recommendations  Skilled nursing-short term rehab (<3 hours/day)    Assistance Recommended at Discharge Frequent or constant Supervision/Assistance  Equipment Recommendations       Recommendations for Other Services      Precautions / Restrictions Precautions Precautions: Fall Restrictions Weight Bearing Restrictions: Yes RLE Weight Bearing: Partial weight bearing RLE Partial Weight Bearing Percentage or Pounds: PWB to RLE w/ post-op shoe, weight bearing on heel only LLE Weight Bearing: Non weight bearing LLE Partial Weight Bearing Percentage or Pounds: Toe touch for transfers only, w/ post-op shoe       Mobility Bed Mobility Overal bed mobility: Needs Assistance Bed Mobility: Supine to Sit;Sit to Supine Rolling: Max assist;+2 for physical assistance   Supine to sit: Max assist;HOB elevated;+2 for physical assistance Sit to supine: Max assist;+2 for physical assistance   General bed mobility comments: Max A + 2 for bed mobility: Total A for moving LE; Mod A for scooting to HOB in supine    Transfers                   General transfer comment: NT, pt declined     Balance Overall balance assessment: Needs assistance Sitting-balance support: Feet supported Sitting balance-Leahy Scale: Poor Sitting balance - Comments: Requires Max A to maintain sitting balance EOB for ~ 3 minutes Postural control: Right lateral lean   Standing balance-Leahy Scale: Zero Standing balance comment: NT, pt declined  ADL either performed or assessed with clinical judgement   ADL Overall ADL's : Needs assistance/impaired Eating/Feeding: Modified independent;Set up   Grooming: Wash/dry hands;Wash/dry face;Set up                                 General ADL Comments: Max-Total A for  LB ADLs    Extremity/Trunk Assessment Upper Extremity Assessment Upper Extremity Assessment: Generalized weakness   Lower Extremity Assessment Lower Extremity Assessment: Generalized weakness RLE Deficits / Details: Cellulitis,partial hallux amputation, partial fifth ray amputation LLE Deficits / Details: Cellulitis, heel wounds, lower leg ulceration        Vision       Perception     Praxis      Cognition Arousal/Alertness: Awake/alert Behavior During Therapy: WFL for tasks assessed/performed Overall Cognitive Status: No family/caregiver present to determine baseline cognitive functioning                                 General Comments: oriented to place, date, situation; becomes agitated with attempts at movement          Exercises Total Joint Exercises Ankle Circles/Pumps: AROM;Both;10 reps;Supine;Strengthening Quad Sets: AROM;Strengthening;Both;10 reps;Supine Gluteal Sets: AROM;Strengthening;Both;10 reps;Supine Towel Squeeze: AROM;Strengthening;Both;10 reps;Supine (with pillow) Other Exercises Other Exercises: Education on benefits of mobility, WB precautions.   Shoulder Instructions       General Comments      Pertinent Vitals/ Pain       Pain Assessment: 0-10 Pain Score: 10-Worst pain ever Pain Location: L heel Pain Descriptors / Indicators: Aching;Grimacing;Crying Pain Intervention(s): Limited activity within patient's tolerance;Repositioned  Home Living                                          Prior Functioning/Environment              Frequency  Min 1X/week        Progress Toward Goals  OT Goals(current goals can now be found in the care plan section)  Progress towards OT goals: Progressing toward goals  Acute Rehab OT Goals Patient Stated Goal: to feel better OT Goal Formulation: With patient Time For Goal Achievement: 05/18/21 Potential to Achieve Goals: Fair ADL Goals Pt Will Perform Lower  Body Dressing: with mod assist;sitting/lateral leans  Plan Discharge plan remains appropriate;Frequency remains appropriate    Co-evaluation                 AM-PAC OT "6 Clicks" Daily Activity     Outcome Measure   Help from another person eating meals?: None Help from another person taking care of personal grooming?: A Little Help from another person toileting, which includes using toliet, bedpan, or urinal?: Total Help from another person bathing (including washing, rinsing, drying)?: A Lot Help from another person to put on and taking off regular upper body clothing?: A Lot Help from another person to put on and taking off regular lower body clothing?: A Lot 6 Click Score: 14    End of Session    OT Visit Diagnosis: Unsteadiness on feet (R26.81);Muscle weakness (generalized) (M62.81);Pain   Activity Tolerance Patient limited by pain;Patient limited by lethargy   Patient Left in bed;with call bell/phone within reach;with bed alarm set;Other (comment) (Prevolon boots bilaterally)   Nurse Communication  Time: 4996-9249 OT Time Calculation (min): 15 min  Charges: OT General Charges $OT Visit: 1 Visit OT Evaluation $OT Re-eval: 1 Re-eval OT Treatments $Self Care/Home Management : 8-22 mins  Josiah Lobo, PhD, MS, OTR/L 05/04/21, 3:45 PM

## 2021-05-04 NOTE — TOC Progression Note (Signed)
Transition of Care [facility]) - Progression Note    Patient Details  Name: [name] MRN: [id number] Date of Birth: 10-Jul-1955  Transition of Care [facility]) CM/SW [place], LCSW Phone Number: 05/04/2021, 9:49 AM  Clinical Narrative:   Updated DSS social worker, Milus Glazier.  Expected Discharge Plan: [place] Barriers to Discharge: Continued Medical Work up  Expected Discharge Plan and Services Expected Discharge Plan: [place] arrangements for the past 2 months: Single Family Home                                       Social Determinants of Health (SDOH) Interventions    Readmission Risk Interventions No flowsheet data found.

## 2021-05-05 DIAGNOSIS — F4322 Adjustment disorder with anxiety: Secondary | ICD-10-CM | POA: Diagnosis not present

## 2021-05-05 LAB — GLUCOSE, CAPILLARY
Glucose-Capillary: 160 mg/dL — ABNORMAL HIGH (ref 70–99)
Glucose-Capillary: 200 mg/dL — ABNORMAL HIGH (ref 70–99)
Glucose-Capillary: 213 mg/dL — ABNORMAL HIGH (ref 70–99)
Glucose-Capillary: 159 mg/dL — ABNORMAL HIGH (ref 70–99)

## 2021-05-05 LAB — SURGICAL PATHOLOGY

## 2021-05-05 MED ORDER — INSULIN GLARGINE-YFGN 100 UNIT/ML ~~LOC~~ SOLN
5.0000 [IU] | Freq: Every day | SUBCUTANEOUS | Status: DC
  Administered 2021-05-05: 5 [IU] via SUBCUTANEOUS
  Filled 2021-05-05 (×2): qty 0.05

## 2021-05-05 MED ORDER — CEPHALEXIN 500 MG PO CAPS
500.0000 mg | ORAL_CAPSULE | Freq: Four times a day (QID) | ORAL | Status: AC
  Administered 2021-05-05 – 2021-05-10 (×20): 500 mg via ORAL
  Filled 2021-05-05 (×20): qty 1

## 2021-05-05 MED ORDER — ATORVASTATIN CALCIUM 20 MG PO TABS
40.0000 mg | ORAL_TABLET | Freq: Every day | ORAL | Status: DC
  Administered 2021-05-06 – 2021-05-17 (×12): 40 mg via ORAL
  Filled 2021-05-05 (×12): qty 2

## 2021-05-05 MED ORDER — DOXYCYCLINE HYCLATE 100 MG PO TABS
100.0000 mg | ORAL_TABLET | Freq: Two times a day (BID) | ORAL | Status: AC
  Administered 2021-05-05 – 2021-05-10 (×10): 100 mg via ORAL
  Filled 2021-05-05 (×10): qty 1

## 2021-05-05 NOTE — Progress Notes (Signed)
Physical Therapy Treatment Patient Details Name: [name] MRN: [id number] DOB: [birth year] Today's Date: 05/05/2021   History of Present Illness 65 y.o. female with a past medical history of DM, HTN, hypothyroidism, kidney stones, lymphedema, morbid obesity and plantar fasciitis followed by podiatry outpatient for some chronic wounds and edema in the lower extremity who presents via EMS after a welfare check was made on the patient with neighbors concerned that patient has been stuck in her recliner for several days.  Patient states her feet have been hurting her for over a month got particularly bad over the last couple of days preventing her from standing or walking. Pt underwent R angioplasty and stent placement on 04/27/21. On 05/03/21, pt underwent right posterior lower leg ulceration wound debridement, left lower leg ulceration wound debridement, left heel wound debridement, right partial hallux amputation, and right partial fifth ray amputation.   PT Comments    Pt was agreeable to participate during the session and put forth good effort throughout. Pt was able to complete all ther ex in bed. Pt is still waiting on second post-op shoe so PT is deferring re-eval until that is delivered. Pt will benefit from PT services in a SNF setting upon discharge to safely address deficits listed in patient problem list for decreased caregiver assistance and eventual return to PLOF.    Recommendations for follow up therapy are one component of a multi-disciplinary discharge planning process, led by the attending physician.  Recommendations may be updated based on patient status, additional functional criteria and insurance authorization.  Follow Up Recommendations  Skilled nursing-short term rehab (<3 hours/day)     Assistance Recommended at Discharge Frequent or constant Supervision/Assistance  Equipment Recommendations  Other (comment) (defer to post acute)    Recommendations for Other Services        Precautions / Restrictions Precautions Precautions: Fall Restrictions Weight Bearing Restrictions: Yes RLE Weight Bearing: Partial weight bearing RLE Partial Weight Bearing Percentage or Pounds: PWB to RLE w/ post-op shoe, WB on heel only LLE Weight Bearing: Partial weight bearing LLE Partial Weight Bearing Percentage or Pounds: Toe touch w/ post-op shoe w/ transfers only     Mobility  Bed Mobility               General bed mobility comments: NT, still waiting on second post-op shoe    Transfers                   General transfer comment: NT, still waiting on second post-op shoe    Ambulation/Gait               General Gait Details: NT, still waiting on second post-op shoe   Stairs             Wheelchair Mobility    Modified Rankin (Stroke Patients Only)       Balance                                            Cognition Arousal/Alertness: Awake/alert Behavior During Therapy: WFL for tasks assessed/performed;Flat affect Overall Cognitive Status: No family/caregiver present to determine baseline cognitive functioning                                          Exercises  Total Joint Exercises Ankle Circles/Pumps: AROM;Both;10 reps;Supine;Strengthening Quad Sets: AROM;Strengthening;Both;10 reps;Supine Gluteal Sets: AROM;Strengthening;Both;10 reps;Supine Towel Squeeze: AROM;Strengthening;Both;10 reps;Supine General Exercises - Upper Extremity Shoulder Flexion: AROM;Both;10 reps;Supine (with HOB elevated) Elbow Flexion: AROM;Strengthening;10 reps;Supine;Both (with HOB elevated, second set with resistance) Elbow Extension: AROM;Strengthening;Both;10 reps;Supine (with HOB elevated, second set with resistance)    General Comments        Pertinent Vitals/Pain Pain Assessment: 0-10 Pain Score: 3  Pain Location: R knee Pain Descriptors / Indicators: Aching Pain Intervention(s): Repositioned     Home Living                          Prior Function            PT Goals (current goals can now be found in the care plan section) Acute Rehab PT Goals Patient Stated Goal: to go home PT Goal Formulation: With patient Time For Goal Achievement: 05/17/21 Potential to Achieve Goals: Fair Progress towards PT goals: PT to reassess next treatment    Frequency    Min 2X/week      PT Plan Current plan remains appropriate    Co-evaluation              AM-PAC PT "6 Clicks" Mobility   Outcome Measure  Help needed turning from your back to your side while in a flat bed without using bedrails?: A Lot Help needed moving from lying on your back to sitting on the side of a flat bed without using bedrails?: A Lot Help needed moving to and from a bed to a chair (including a wheelchair)?: Total Help needed standing up from a chair using your arms (e.g., wheelchair or bedside chair)?: Total Help needed to walk in hospital room?: Total Help needed climbing 3-5 steps with a railing? : Total 6 Click Score: 8    End of Session   Activity Tolerance: Patient tolerated treatment well Patient left: in bed;with call bell/phone within reach;with bed alarm set Nurse Communication: Mobility status PT Visit Diagnosis: Muscle weakness (generalized) (M62.81);Difficulty in walking, not elsewhere classified (R26.2);Pain Pain - part of body: Leg     Time: 1042-1101 PT Time Calculation (min) (ACUTE ONLY): 19 min  Charges:  $Therapeutic Exercise: 8-22 mins                     Sheldon Silvan SPT 05/05/21, 1:40 PM

## 2021-05-05 NOTE — TOC Progression Note (Signed)
Transition of Care [facility]) - Progression Note    Patient Details  Name: [name] MRN: [id number] Date of Birth: 09-06-1955  [facility]) CM/SW Contact  Candie Chroman, LCSW Phone Number: 05/05/2021, 4:26 PM  Clinical Narrative:   Per MD, no further surgeries planned. Left message for [facility] admissions coordinator asking her to start [facility] authorization and order wound vac and air mattress.  Expected Discharge Plan: [place] Barriers to Discharge: Continued Medical Work up  Expected Discharge Plan and Services Expected Discharge Plan: [place] arrangements for the past 2 months: Single Family Home                                       Social Determinants of Health (SDOH) Interventions    Readmission Risk Interventions No flowsheet data found.

## 2021-05-05 NOTE — NC FL2 (Signed)
[place] LEVEL OF CARE SCREENING TOOL     IDENTIFICATION  Patient Name: [name] Birthdate: 08-08-1955 Sex: female Admission Date (Current Location): 04/16/2021  [facility] and [place] Number:  Engineering geologist and Address:  [facility], [address], [place], [place] [address]      Provider Number: [id number]  Attending Physician Name and Address:  Gwynne Edinger, MD  Relative Name and Phone Number:       Current Level of Care: Hospital Recommended Level of Care: [place] Prior Approval Number:    Date Approved/Denied:   PASRR Number: [id number]  Discharge Plan: SNF    Current Diagnoses: Patient Active Problem List   Diagnosis Date Noted   Adjustment disorder with anxiety 04/21/2021   Sepsis due to cellulitis ([place]) 04/17/2021   Pressure injury of skin 04/17/2021   Left leg pain 04/16/2021   Morbid obesity ([place]) 10/24/2017   Chronic kidney disease, stage IV (severe) ([place]) 10/24/2017   Hypothyroidism 11/15/2016   Edema 11/15/2016   Type 2 diabetes mellitus with microalbuminuria, without long-term current use of insulin ([place]) 11/15/2016   Essential hypertension 12/09/2015   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 ([place]) 06/26/2015   Pressure ulcer 05/31/2015   Primary osteoarthritis of right hip 05/28/2015    Orientation RESPIRATION BLADDER Height & Weight     Self, Time, Situation, Place  Normal Incontinent, External catheter Weight: 261 lb 3.9 oz (118.5 kg) Height:  5\' 5"  (165.1 cm)  BEHAVIORAL SYMPTOMS/MOOD NEUROLOGICAL BOWEL NUTRITION STATUS   (None)  (None) Incontinent Diet (Carb modified)  AMBULATORY STATUS COMMUNICATION OF NEEDS Skin   Total Care Verbally Skin abrasions, Other (Comment), PU Stage and Appropriate Care, Surgical wounds, Wound Vac (Unstageable pressure injury on left posterior heel (vac). Incisions on right & left toe/foot (surgical  dressing). MASD on right and left coccyx (cream). Cellulitis/cracking on both lower legs (ace wrap). Eschar on right posterior tibial (ABD, gauze).)   PU Stage 2 Dressing:  (Sacrum: Foam.)                   Personal Care Assistance Level of Assistance  Bathing, Feeding, Dressing Bathing Assistance: Maximum assistance Feeding assistance: Limited assistance Dressing Assistance: Maximum assistance     Functional Limitations Info  Sight, Hearing, Speech Sight Info: Adequate Hearing Info: Adequate Speech Info: Adequate    SPECIAL CARE FACTORS FREQUENCY  PT (By licensed PT), OT (By licensed OT)     PT Frequency: 5 x week OT Frequency: 5 x week            Contractures Contractures Info: Not present    Additional Factors Info  Code Status, Allergies Code Status Info: Full Allergies Info: NKDA           Current Medications (05/05/2021):  This is the current hospital active medication list Current Facility-Administered Medications  Medication Dose Route Frequency Provider Last Rate Last Admin   acetaminophen (TYLENOL) tablet 650 mg  650 mg Oral Q6H PRN Caroline More, DPM       Or   acetaminophen (TYLENOL) suppository 650 mg  650 mg Rectal Q6H PRN Caroline More, DPM       acidophilus (RISAQUAD) capsule 2 capsule  2 capsule Oral Daily Caroline More, DPM   2 capsule at 05/05/21 1034   amLODipine (NORVASC) tablet 5 mg  5 mg Oral Daily Caroline More, DPM   5 mg at 05/05/21 1034   [START ON 05/06/2021]  atorvastatin (LIPITOR) tablet 40 mg  40 mg Oral Daily Wouk, Ailene Rud, MD       bismuth subsalicylate (PEPTO BISMOL) 262 MG/15ML suspension 30 mL  30 mL Oral Q4H PRN Caroline More, DPM   30 mL at 04/23/21 0558   cephALEXin (KEFLEX) capsule 500 mg  500 mg Oral Q6H Wouk, Ailene Rud, MD       cloNIDine (CATAPRES) tablet 0.3 mg  0.3 mg Oral TID Caroline More, DPM   0.3 mg at 05/05/21 1634   clopidogrel (PLAVIX) tablet 75 mg  75 mg Oral Q breakfast Caroline More, DPM   75 mg at  05/05/21 0842   doxycycline (VIBRA-TABS) tablet 100 mg  100 mg Oral Q12H Wouk, Ailene Rud, MD       enoxaparin (LOVENOX) injection 60 mg  0.5 mg/kg Subcutaneous Q24H Caroline More, DPM   60 mg at 05/05/21 1037   hydrALAZINE (APRESOLINE) injection 5 mg  5 mg Intravenous Q20 Min PRN Caroline More, DPM       hydrocerin (EUCERIN) cream   Topical Daily Caroline More, DPM   Given at 05/05/21 1035   hydrocortisone cream 1 %   Topical TID PRN Caroline More, DPM   Given at 04/25/21 2026   HYDROmorphone (DILAUDID) injection 1 mg  1 mg Intravenous Q3H PRN Caroline More, DPM   1 mg at 05/04/21 1638   hydrOXYzine (ATARAX/VISTARIL) tablet 10 mg  10 mg Oral Q6H PRN Caroline More, DPM   10 mg at 04/29/21 2231   insulin aspart (novoLOG) injection 0-15 Units  0-15 Units Subcutaneous TID WC Caroline More, DPM   5 Units at 05/05/21 1633   insulin aspart (novoLOG) injection 0-5 Units  0-5 Units Subcutaneous QHS Caroline More, DPM   2 Units at 04/30/21 2202   insulin glargine-yfgn (SEMGLEE) injection 5 Units  5 Units Subcutaneous QHS Wouk, Ailene Rud, MD       labetalol (NORMODYNE) injection 10 mg  10 mg Intravenous Q10 min PRN Caroline More, DPM       levothyroxine (SYNTHROID) tablet 112 mcg  112 mcg Oral Q0600 Caroline More, DPM   112 mcg at 05/05/21 2725   liver oil-zinc oxide (DESITIN) 40 % ointment   Topical PRN Caroline More, DPM       magnesium hydroxide (MILK OF MAGNESIA) suspension 30 mL  30 mL Oral Daily PRN Caroline More, DPM       meclizine (ANTIVERT) tablet 12.5 mg  12.5 mg Oral TID PRN Caroline More, DPM   12.5 mg at 04/29/21 2232   metoprolol succinate (TOPROL-XL) 24 hr tablet 50 mg  50 mg Oral Q breakfast Caroline More, DPM   50 mg at 05/05/21 0846   ondansetron (ZOFRAN) tablet 4 mg  4 mg Oral Q6H PRN Caroline More, DPM       Or   ondansetron (ZOFRAN) injection 4 mg  4 mg Intravenous Q6H PRN Caroline More, DPM       oxyCODONE (Oxy IR/ROXICODONE) immediate release tablet 5-10 mg  5-10 mg Oral Q4H  PRN Caroline More, DPM   10 mg at 05/04/21 1234   sodium chloride flush (NS) 0.9 % injection 3 mL  3 mL Intravenous Q12H Caroline More, DPM   3 mL at 05/05/21 1035   sodium chloride flush (NS) 0.9 % injection 3 mL  3 mL Intravenous PRN Caroline More, DPM       traZODone (DESYREL) tablet 25 mg  25 mg Oral QHS PRN Caroline More, DPM   25 mg  at 05/03/21 2125     Discharge Medications: Please see discharge summary for a list of discharge medications.  Relevant Imaging Results:  Relevant Lab Results:   Additional Information ss [id number]  Candie Chroman, LCSW

## 2021-05-05 NOTE — Progress Notes (Addendum)
PROGRESS NOTE    [name]  PZW:[id number] DOB: [birth year] DOA: 04/16/2021 PCP: Juline Patch, MD    Brief Narrative:  unfortunate 65 year old female with extensive past medical history who was brought in via EMS for evaluation of bilateral lower extremity redness swelling and pain.  Apparently patient has been in her wheelchair at home unable to move for several days.  Lower extremity significant cellulitic changes.  On broad-spectrum antibiotics.  Also some question of fracture in the feet on the right lower extremity.   Podiatry consulted on admission,Concern regarding vascular supply lower extremities.  Podiatry apparently reached out to vascular surgery for consultation prior to any surgical intervention.  Awaiting vascular evaluation to assess lower extremity vasculopathy. WOC following. ABI completed with results significant for severe bilateral peripheral arterial disease.    Angiogram delayed several times due to patient refusal and concerned about capacity.  Angiogram completed on 11/1.  Successful recanalization right lower extremity for limb salvage.  Podiatry is deferring surgical intervention until we have angiography and revascularization attempt on both lower extremities.  Now the right is been completed the patient is apparently refusing left lower extremity angiogram.  Vascular surgery is offered general anesthesia but at this time the patient continues to refuse.  11/4: Patient agreed to angiography this morning.  Notify vascular surgery 11/5: Status post angiography.  Appreciate podiatry follow-up.  Current plan for surgical intervention with Dr. Luana Shu on 11/7 11/7: N.p.o. for debridement with podiatry today.  Patient aware of surgical plan and in agreement.  N.p.o. 11/8: Postop day 1 status post extensive debridement and wound VAC placement     Assessment & Plan:  Sepsis Severe bilateral lower extremity cellulitis, nonpurulent Sepsis criteria met with  tachycardia and leukocytosis.  Presumed source bilateral lower extremity nonpurulent cellulitis Concern regarding vascular supply Podiatry and vascular consulted MRSA screen negative Treated with vanc/cefepime then unasyn, abx stopped 11/8-Angiogram with successful recanalization right lower extremity for limb salvage on 11/1 -Angiogram of left lower extremity successful on 11/4 -Status post extensive debridement and wound VAC placement with podiatry on 11/7 Plan: Postoperative wound care Continue wound vac Clean margins, podiatry advises 5 days more abx, will start keflex/doxy   Acute kidney injury on chronic kidney disease stage IIIb Cr has stabilized around 1.5 - monitor   Hypothyroidism Continue Synthroid   Type 2 diabetes mellitus with peripheral neuropathy Oral medications on hold BG overall somewhat elevated Continue RISS Add lantus 5 qhs Continue Neurontin   Essential hypertension Better controlled Continue Toprol, clonidine, amlodipine ARB on hold as above IV hydralazine   Morbid obesity BMI 42.35 This complicates overall care and prognosis Nutrition consult   Poor self-care TOC consult DSS evaluation Will need SNF, [facility] medical [facility]  DVT prophylaxis: SQ Lovenox Code Status: Full Family Communication: None today Disposition Plan: Status is: Inpatient  Remains inpatient appropriate because: Postop day 1 status post extensive debridement lower extremities.       Level of care: Med-Surg  Consultants:  Vascular surgery Podiatry  Procedures:  Right lower extremity angiogram 11/1 Left lower extremity angiogram 11/4 Surgical debridement 11/7  Antimicrobials: See above   Subjective: Patient seen and examined.  No pain. Tolerating diet.  Objective: Vitals:   05/04/21 2101 05/05/21 0426 05/05/21 0735 05/05/21 0845  BP: (!) 123/43 (!) 152/54 (!) 152/62   Pulse: (!) 53 62 (!) 56 69  Resp: 16 16    Temp:  97.7 F (36.5 C) 98.1 F (36.7 C) 97.7 F (  36.5 C)   TempSrc: Oral Oral Oral   SpO2: 96% 95% 98% 98%  Weight:      Height:        Intake/Output Summary (Last 24 hours) at 05/05/2021 1508 Last data filed at 05/05/2021 1411 Gross per 24 hour  Intake 1620 ml  Output 600 ml  Net 1020 ml   Filed Weights   04/16/21 2244 04/17/21 0403 05/03/21 1640  Weight: 122.5 kg 118.5 kg 118.5 kg    Examination:  General exam: Mild distress due to pain Respiratory system: No adventitious sounds.  Normal work of breathing.  Room air Cardiovascular system: S1-S2, RRR, no murmurs Gastrointestinal system: Obese, NT/ND, normal bowel sounds Central nervous system: Alert and oriented. No focal neurological deficits. Extremities: Decreased power symmetrically.  Bilateral lower extremities in surgical wraps.  Not removed.      Psychiatry: Judgement and insight appear normal. Mood & affect appropriate.     Data Reviewed: I have personally reviewed following labs and imaging studies  CBC: Recent Labs  Lab 04/29/21 0749 05/03/21 0752 05/04/21 0729  WBC 9.0 8.1 8.6  NEUTROABS 6.2 6.0 6.2  HGB 9.9* 9.2* 8.9*  HCT 32.4* 30.1* 30.1*  MCV 98.5 98.7 99.7  PLT 301 327 979   Basic Metabolic Panel: Recent Labs  Lab 04/29/21 0749 04/30/21 0528 05/03/21 0752 05/04/21 0729  NA 137 137 140 140  K 4.3 4.2 4.7 4.5  CL 111 112* 112* 112*  CO2 20* 19* 20* 20*  GLUCOSE 206* 175* 160* 146*  BUN _0 CREATININE 1.40* 1.33* 1.52* 1.52*  CALCIUM 8.2* 8.2* 8.3* 8.2*   GFR: Estimated Creatinine Clearance: 46.3 mL/min (A) (by C-G formula based on SCr of 1.52 mg/dL (H)). Liver Function Tests: No results for input(s): AST, ALT, ALKPHOS, BILITOT, PROT, ALBUMIN in the last 168 hours. No results for input(s): LIPASE, AMYLASE in the last 168 hours. No results for input(s): AMMONIA in the last 168 hours. Coagulation Profile: Recent Labs  Lab 05/03/21 0752  INR 1.5*   Cardiac Enzymes: No  results for input(s): CKTOTAL, CKMB, CKMBINDEX, TROPONINI in the last 168 hours. BNP (last 3 results) No results for input(s): PROBNP in the last 8760 hours. HbA1C: No results for input(s): HGBA1C in the last 72 hours. CBG: Recent Labs  Lab 05/04/21 1134 05/04/21 1635 05/04/21 2046 05/05/21 0734 05/05/21 1126  GLUCAP 155* 139* 125* 160* 200*   Lipid Profile: No results for input(s): CHOL, HDL, LDLCALC, TRIG, CHOLHDL, LDLDIRECT in the last 72 hours. Thyroid Function Tests: No results for input(s): TSH, T4TOTAL, FREET4, T3FREE, THYROIDAB in the last 72 hours. Anemia Panel: No results for input(s): VITAMINB12, FOLATE, FERRITIN, TIBC, IRON, RETICCTPCT in the last 72 hours. Sepsis Labs: No results for input(s): PROCALCITON, LATICACIDVEN in the last 168 hours.  No results found for this or any previous visit (from the past 240 hour(s)).        Radiology Studies: No results found.      Scheduled Meds:  acidophilus  2 capsule Oral Daily   amLODipine  5 mg Oral Daily   atorvastatin  20 mg Oral Daily   cloNIDine  0.3 mg Oral TID   clopidogrel  75 mg Oral Q breakfast   enoxaparin (LOVENOX) injection  0.5 mg/kg Subcutaneous Q24H   hydrocerin   Topical Daily   insulin aspart  0-15 Units Subcutaneous TID WC   insulin aspart  0-5 Units Subcutaneous QHS   levothyroxine  112 mcg Oral Q0600   metoprolol  succinate  50 mg Oral Q breakfast   sodium chloride flush  3 mL Intravenous Q12H   Continuous Infusions:  sodium chloride 75 mL/hr at 05/05/21 0507     LOS: 18 days    Time spent: 35 minutes    Desma Maxim, MD [facility]   If 7PM-7AM, please contact night-coverage  05/05/2021, 3:08 PM

## 2021-05-06 DIAGNOSIS — F4322 Adjustment disorder with anxiety: Secondary | ICD-10-CM | POA: Diagnosis not present

## 2021-05-06 LAB — CBC
HCT: 27.8 % — ABNORMAL LOW (ref 36.0–46.0)
Hemoglobin: 8.6 g/dL — ABNORMAL LOW (ref 12.0–15.0)
MCH: 30.6 pg (ref 26.0–34.0)
MCHC: 30.9 g/dL (ref 30.0–36.0)
MCV: 98.9 fL (ref 80.0–100.0)
Platelets: 261 10*3/uL (ref 150–400)
RBC: 2.81 MIL/uL — ABNORMAL LOW (ref 3.87–5.11)
RDW: 18 % — ABNORMAL HIGH (ref 11.5–15.5)
WBC: 6.6 10*3/uL (ref 4.0–10.5)
nRBC: 0 % (ref 0.0–0.2)

## 2021-05-06 LAB — BASIC METABOLIC PANEL
Anion gap: 5 (ref 5–15)
BUN: 15 mg/dL (ref 8–23)
CO2: 20 mmol/L — ABNORMAL LOW (ref 22–32)
Calcium: 8.1 mg/dL — ABNORMAL LOW (ref 8.9–10.3)
Chloride: 113 mmol/L — ABNORMAL HIGH (ref 98–111)
Creatinine, Ser: 1.32 mg/dL — ABNORMAL HIGH (ref 0.44–1.00)
GFR, Estimated: 44 mL/min — ABNORMAL LOW (ref >60)
Glucose, Bld: 165 mg/dL — ABNORMAL HIGH (ref 70–99)
Potassium: 4.2 mmol/L (ref 3.5–5.1)
Sodium: 138 mmol/L (ref 135–145)

## 2021-05-06 LAB — GLUCOSE, CAPILLARY
Glucose-Capillary: 150 mg/dL — ABNORMAL HIGH (ref 70–99)
Glucose-Capillary: 154 mg/dL — ABNORMAL HIGH (ref 70–99)
Glucose-Capillary: 129 mg/dL — ABNORMAL HIGH (ref 70–99)
Glucose-Capillary: 125 mg/dL — ABNORMAL HIGH (ref 70–99)

## 2021-05-06 MED ORDER — OXYCODONE HCL 5 MG PO TABS
5.0000 mg | ORAL_TABLET | ORAL | Status: DC | PRN
  Administered 2021-05-06 – 2021-05-09 (×3): 5 mg via ORAL
  Filled 2021-05-06 (×4): qty 1

## 2021-05-06 MED ORDER — INSULIN GLARGINE-YFGN 100 UNIT/ML ~~LOC~~ SOLN
8.0000 [IU] | Freq: Every day | SUBCUTANEOUS | Status: DC
  Administered 2021-05-06 – 2021-05-17 (×10): 8 [IU] via SUBCUTANEOUS
  Filled 2021-05-06 (×13): qty 0.08

## 2021-05-06 MED ORDER — IRBESARTAN 150 MG PO TABS
75.0000 mg | ORAL_TABLET | Freq: Every day | ORAL | Status: DC
  Administered 2021-05-06 – 2021-05-08 (×3): 75 mg via ORAL
  Filled 2021-05-06 (×3): qty 1

## 2021-05-06 NOTE — Progress Notes (Signed)
Daily Progress Note   Subjective  - 3 Days Post-Op  Follow-up bilateral foot wounds.  Status post right fifth toe amputation as well as debridement of left heel.  Objective Vitals:   05/05/21 0845 05/05/21 1620 05/05/21 2113 05/06/21 0739  BP:  (!) 143/54 (!) 145/64 (!) 146/51  Pulse: 69 (!) 59 (!) 57 63  Resp:  20  19  Temp:  98.2 F (36.8 C) 98.2 F (36.8 C) 97.9 F (36.6 C)  TempSrc:  Oral Oral Oral  SpO2: 98% 97% 96% 97%  Weight:      Height:        Physical Exam: Patient resting in bed.  Dressings changed today. Right foot dressing change.  Wound to the distal aspect of the great toe as well as lateral fifth metatarsophalangeal joint region are stable.  No signs of dehiscence.  Sutures are intact.  No signs of residual infection at this time currently.  She has superficial wounds on the posterior aspect of both legs.  She has severe lymphedema.  Left heel ulceration was evaluated.  Wound VAC removed.  There is mixed granular fibrotic tissue.  It measures 4 x 2-1/2 cm.  Full-thickness down deep into the subcutaneous tissue.  No signs of acute infection.  Large amount of Surgicel was removed from the wound bed.       Laboratory CBC    Component Value Date/Time   WBC 6.6 05/06/2021 0624   HGB 8.6 (L) 05/06/2021 0624   HGB 12.5 02/20/2012 0737   HCT 27.8 (L) 05/06/2021 0624   HCT 37.6 02/20/2012 0737   PLT 261 05/06/2021 0624   PLT 230 02/20/2012 0737    BMET    Component Value Date/Time   NA 138 05/06/2021 0624   NA 137 10/22/2020 1450   NA 139 02/20/2012 0737   K 4.2 05/06/2021 0624   K 4.7 02/20/2012 0737   CL 113 (H) 05/06/2021 0624   CL 103 02/20/2012 0737   CO2 20 (L) 05/06/2021 0624   CO2 27 02/20/2012 0737   GLUCOSE 165 (H) 05/06/2021 0624   GLUCOSE 123 (H) 02/20/2012 0737   BUN 15 05/06/2021 0624   BUN 27 10/22/2020 1450   BUN 26 (H) 02/20/2012 0737   CREATININE 1.32 (H) 05/06/2021 0624   CREATININE 1.31 (H) 02/20/2012 0737   CALCIUM 8.1 (L)  05/06/2021 0624   CALCIUM 9.3 02/20/2012 0737   GFRNONAA 44 (L) 05/06/2021 0624   GFRNONAA 45 (L) 02/20/2012 0737   GFRAA 39 (L) 04/30/2020 1408   GFRAA 52 (L) 02/20/2012 0737    Assessment/Planning: 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 Also status post left heel debridement for necrotic tissue.  The pathology shows clean margins at the proximal aspect.  Would recommend 5 days of residual oral antibiotics for residual soft tissue infection. All wounds look to be stable.  Dressings changed today. Patient will need 3 times a week wound VAC dressing on her left heel. Can perform dry dressing 3 times a week to the right foot wounds. Bilateral leg wounds with mild drainage.  I would recommend 3 times a week Xeroform dressings to the lower leg wounds. Patient has been seen routinely by the wound care center.  Recommend follow-up for bilateral lower leg wounds as well as lymphedema. Follow-up with podiatry in approximately 2 weeks to reevaluate the surgical sites. Patient will need to remain nonweightbearing to the left heel.  Can perform weightbearing on the right heel as needed  for transfer. From podiatry standpoint patient is stable for discharge.  Samara Deist A  05/06/2021, 12:36 PM

## 2021-05-06 NOTE — Progress Notes (Signed)
BLE dressings changed, cream applied, wound vac changed.  Dr. Vickki Muff present.

## 2021-05-06 NOTE — Progress Notes (Signed)
PROGRESS NOTE    [name]  ERD:[id number] DOB: 1955/11/22 DOA: 04/16/2021 PCP: Juline Patch, MD    Brief Narrative:  unfortunate 65 year old female with extensive past medical history who was brought in via EMS for evaluation of bilateral lower extremity redness swelling and pain.  Apparently patient has been in her wheelchair at home unable to move for several days.  Lower extremity significant cellulitic changes.  On broad-spectrum antibiotics.  Also some question of fracture in the feet on the right lower extremity.   Podiatry consulted on admission,Concern regarding vascular supply lower extremities.  Podiatry apparently reached out to vascular surgery for consultation prior to any surgical intervention.  Awaiting vascular evaluation to assess lower extremity vasculopathy. WOC following. ABI completed with results significant for severe bilateral peripheral arterial disease.    Angiogram delayed several times due to patient refusal and concerned about capacity.  Angiogram completed on 11/1.  Successful recanalization right lower extremity for limb salvage.  Podiatry is deferring surgical intervention until we have angiography and revascularization attempt on both lower extremities.  Now the right is been completed the patient is apparently refusing left lower extremity angiogram.  Vascular surgery is offered general anesthesia but at this time the patient continues to refuse.  11/4: Patient agreed to angiography this morning.  Notify vascular surgery 11/5: Status post angiography.  Appreciate podiatry follow-up.  Current plan for surgical intervention with Dr. Luana Shu on 11/7 11/7: N.p.o. for debridement with podiatry today.  Patient aware of surgical plan and in agreement.  N.p.o. 11/8: Postop day 1 status post extensive debridement and wound VAC placement     Assessment & Plan:  Sepsis Severe bilateral lower extremity cellulitis, nonpurulent Sepsis criteria met with  tachycardia and leukocytosis.  Presumed source bilateral lower extremity nonpurulent cellulitis Concern regarding vascular supply Podiatry and vascular consulted MRSA screen negative Treated with vanc/cefepime then unasyn, iv abx stopped 11/8 -Angiogram with successful recanalization right lower extremity for limb salvage on 11/1 -Angiogram of left lower extremity successful on 11/4 -Status post extensive debridement and wound VAC placement with podiatry on 11/7 Plan: Postoperative wound care Continue wound vac Clean margins, podiatry advises 5 days abx, started keflex/doxy on 11/9 Podiatry to see today and document wound care instructions Stable for discharge, pending insurance authorization for snf   Acute kidney injury on chronic kidney disease stage IIIb Aki resolved, cr stable at 1.3 - monitor   Hypothyroidism Continue Synthroid   Type 2 diabetes mellitus with peripheral neuropathy Oral medications on hold BG overall somewhat elevated Continue RISS Increase lantus to 8 qhs Continue Neurontin   Essential hypertension Better controlled Continue Toprol, clonidine, amlodipine Re-start arb today as kidney function has returned to baseline. Start irbesartan 75 for home telmisartan   Morbid obesity BMI 56.31 This complicates overall care and prognosis Nutrition consulted   Poor self-care TOC consulted DSS is following   DVT prophylaxis: SQ Lovenox Code Status: Full Family Communication: patient requests no calls Disposition Plan: Status is: Inpatient  Remains inpatient appropriate because: unsafe d/c plan       Level of care: Med-Surg  Consultants:  Vascular surgery Podiatry  Procedures:  Right lower extremity angiogram 11/1 Left lower extremity angiogram 11/4 Surgical debridement 11/7  Antimicrobials: See above   Subjective: Patient seen and examined.  Mild toe pain. Tolerating diet.  Objective: Vitals:   05/05/21 0845 05/05/21 1620 05/05/21  2113 05/06/21 0739  BP:  (!) 143/54 (!) 145/64 (!) 146/51  Pulse: 69 (!) 59 (!) 57  63  Resp:  20  19  Temp:  98.2 F (36.8 C) 98.2 F (36.8 C) 97.9 F (36.6 C)  TempSrc:  Oral Oral Oral  SpO2: 98% 97% 96% 97%  Weight:      Height:        Intake/Output Summary (Last 24 hours) at 05/06/2021 1018 Last data filed at 05/06/2021 1016 Gross per 24 hour  Intake 600 ml  Output 450 ml  Net 150 ml   Filed Weights   04/16/21 2244 04/17/21 0403 05/03/21 1640  Weight: 122.5 kg 118.5 kg 118.5 kg    Examination:  General exam: Mild distress due to pain Respiratory system: No adventitious sounds.  Normal work of breathing.  Room air Cardiovascular system: S1-S2, RRR, no murmurs Gastrointestinal system: Obese, NT/ND, normal bowel sounds Central nervous system: Alert and oriented. No focal neurological deficits. Extremities: Decreased power symmetrically.  Bilateral lower extremities in surgical wraps.  Not removed.    From 11/9:    Psychiatry: Judgement and insight appear normal. Mood & affect appropriate.     Data Reviewed: I have personally reviewed following labs and imaging studies  CBC: Recent Labs  Lab 05/03/21 0752 05/04/21 0729 05/06/21 0624  WBC 8.1 8.6 6.6  NEUTROABS 6.0 6.2  --   HGB 9.2* 8.9* 8.6*  HCT 30.1* 30.1* 27.8*  MCV 98.7 99.7 98.9  PLT 327 331 261   Basic Metabolic Panel: Recent Labs  Lab 04/30/21 0528 05/03/21 0752 05/04/21 0729 05/06/21 0624  NA 137 140 140 138  K 4.2 4.7 4.5 4.2  CL 112* 112* 112* 113*  CO2 19* 20* 20* 20*  GLUCOSE 175* 160* 146* 165*  BUN 21 20 19 15  CREATININE 1.33* 1.52* 1.52* 1.32*  CALCIUM 8.2* 8.3* 8.2* 8.1*   GFR: Estimated Creatinine Clearance: 53.3 mL/min (A) (by C-G formula based on SCr of 1.32 mg/dL (H)). Liver Function Tests: No results for input(s): AST, ALT, ALKPHOS, BILITOT, PROT, ALBUMIN in the last 168 hours. No results for input(s): LIPASE, AMYLASE in the last 168 hours. No results for input(s):  AMMONIA in the last 168 hours. Coagulation Profile: Recent Labs  Lab 05/03/21 0752  INR 1.5*   Cardiac Enzymes: No results for input(s): CKTOTAL, CKMB, CKMBINDEX, TROPONINI in the last 168 hours. BNP (last 3 results) No results for input(s): PROBNP in the last 8760 hours. HbA1C: No results for input(s): HGBA1C in the last 72 hours. CBG: Recent Labs  Lab 05/05/21 0734 05/05/21 1126 05/05/21 1619 05/05/21 2156 05/06/21 0740  GLUCAP 160* 200* 213* 159* 150*   Lipid Profile: No results for input(s): CHOL, HDL, LDLCALC, TRIG, CHOLHDL, LDLDIRECT in the last 72 hours. Thyroid Function Tests: No results for input(s): TSH, T4TOTAL, FREET4, T3FREE, THYROIDAB in the last 72 hours. Anemia Panel: No results for input(s): VITAMINB12, FOLATE, FERRITIN, TIBC, IRON, RETICCTPCT in the last 72 hours. Sepsis Labs: No results for input(s): PROCALCITON, LATICACIDVEN in the last 168 hours.  No results found for this or any previous visit (from the past 240 hour(s)).        Radiology Studies: No results found.      Scheduled Meds:  acidophilus  2 capsule Oral Daily   amLODipine  5 mg Oral Daily   atorvastatin  40 mg Oral Daily   cephALEXin  500 mg Oral Q6H   cloNIDine  0.3 mg Oral TID   clopidogrel  75 mg Oral Q breakfast   doxycycline  100 mg Oral Q12H   enoxaparin (LOVENOX) injection  0.5   mg/kg Subcutaneous Q24H   hydrocerin   Topical Daily   insulin aspart  0-15 Units Subcutaneous TID WC   insulin aspart  0-5 Units Subcutaneous QHS   insulin glargine-yfgn  5 Units Subcutaneous QHS   levothyroxine  112 mcg Oral Q0600   metoprolol succinate  50 mg Oral Q breakfast   sodium chloride flush  3 mL Intravenous Q12H   Continuous Infusions:     LOS: 19 days    Time spent: 25 minutes    Noah B Wouk, MD [facility]   If 7PM-7AM, please contact night-coverage  05/06/2021, 10:18 AM   

## 2021-05-07 DIAGNOSIS — F4322 Adjustment disorder with anxiety: Secondary | ICD-10-CM | POA: Diagnosis not present

## 2021-05-07 LAB — BASIC METABOLIC PANEL
Anion gap: 4 — ABNORMAL LOW (ref 5–15)
BUN: 17 mg/dL (ref 8–23)
CO2: 20 mmol/L — ABNORMAL LOW (ref 22–32)
Calcium: 8 mg/dL — ABNORMAL LOW (ref 8.9–10.3)
Chloride: 115 mmol/L — ABNORMAL HIGH (ref 98–111)
Creatinine, Ser: 1.27 mg/dL — ABNORMAL HIGH (ref 0.44–1.00)
GFR, Estimated: 46 mL/min — ABNORMAL LOW (ref >60)
Glucose, Bld: 125 mg/dL — ABNORMAL HIGH (ref 70–99)
Potassium: 4.2 mmol/L (ref 3.5–5.1)
Sodium: 139 mmol/L (ref 135–145)

## 2021-05-07 LAB — GLUCOSE, CAPILLARY
Glucose-Capillary: 125 mg/dL — ABNORMAL HIGH (ref 70–99)
Glucose-Capillary: 172 mg/dL — ABNORMAL HIGH (ref 70–99)
Glucose-Capillary: 150 mg/dL — ABNORMAL HIGH (ref 70–99)
Glucose-Capillary: 128 mg/dL — ABNORMAL HIGH (ref 70–99)

## 2021-05-07 MED ORDER — MECLIZINE HCL 25 MG PO TABS
25.0000 mg | ORAL_TABLET | Freq: Three times a day (TID) | ORAL | Status: DC | PRN
  Administered 2021-05-10 – 2021-05-12 (×4): 25 mg via ORAL
  Filled 2021-05-07 (×5): qty 1

## 2021-05-07 NOTE — TOC Progression Note (Signed)
Transition of Care [facility]) - Progression Note    Patient Details  Name: [name] MRN: [id number] Date of Birth: 03/09/1956  Transition of Care [facility]) CM/SW [place], LCSW Phone Number: 05/07/2021, 12:05 PM  Clinical Narrative:   SNF insurance authorization still pending. [facility] will not order wound vac until she is in-house.  Expected Discharge Plan: [place] Barriers to Discharge: Continued Medical Work up  Expected Discharge Plan and Services Expected Discharge Plan: [place] arrangements for the past 2 months: Single Family Home                                       Social Determinants of Health (SDOH) Interventions    Readmission Risk Interventions No flowsheet data found.

## 2021-05-07 NOTE — Progress Notes (Signed)
PROGRESS NOTE    [name]  WUJ:[id number] DOB: [birth year] DOA: 04/16/2021 PCP: Juline Patch, MD    Brief Narrative:  unfortunate 65 year old female with extensive past medical history who was brought in via EMS for evaluation of bilateral lower extremity redness swelling and pain.  Apparently patient has been in her wheelchair at home unable to move for several days.  Lower extremity significant cellulitic changes.  On broad-spectrum antibiotics.  Also some question of fracture in the feet on the right lower extremity.   Podiatry consulted on admission,Concern regarding vascular supply lower extremities.  Podiatry apparently reached out to vascular surgery for consultation prior to any surgical intervention.  Awaiting vascular evaluation to assess lower extremity vasculopathy. WOC following. ABI completed with results significant for severe bilateral peripheral arterial disease.    Angiogram delayed several times due to patient refusal and concerned about capacity.  Angiogram completed on 11/1.  Successful recanalization right lower extremity for limb salvage.  Podiatry is deferring surgical intervention until we have angiography and revascularization attempt on both lower extremities.  Now the right is been completed the patient is apparently refusing left lower extremity angiogram.  Vascular surgery is offered general anesthesia but at this time the patient continues to refuse.  11/4: Patient agreed to angiography this morning.  Notify vascular surgery 11/5: Status post angiography.  Appreciate podiatry follow-up.  Current plan for surgical intervention with Dr. Luana Shu on 11/7 11/7: N.p.o. for debridement with podiatry today.  Patient aware of surgical plan and in agreement.  N.p.o. 11/8: Postop day 1 status post extensive debridement and wound VAC placement     Assessment & Plan:  Sepsis Severe bilateral lower extremity cellulitis, nonpurulent Sepsis criteria met with  tachycardia and leukocytosis.  Presumed source bilateral lower extremity nonpurulent cellulitis Concern regarding vascular supply Podiatry and vascular consulted MRSA screen negative Treated with vanc/cefepime then unasyn, iv abx stopped 11/8 -Angiogram with successful recanalization right lower extremity for limb salvage on 11/1 -Angiogram of left lower extremity successful on 11/4 -Status post extensive debridement and wound VAC placement with podiatry on 11/7 Plan: Postoperative wound care Continue wound vac Clean margins, podiatry advises 5 days abx, started keflex/doxy on 11/9 Podiatry to see today and document wound care instructions Stable for discharge, pending insurance authorization for snf  BPPV Patient reports several days vertigo provoked by head movement relieved w/ rest, no tinnitus or ha or hearing loss. - pt eval/tx ordered, though not sure patient's mobility will be sufficient for eval and treatment - meclizine prn   Acute kidney injury on chronic kidney disease stage IIIb Aki resolved, cr stable at 1.3 - monitor   Hypothyroidism Continue Synthroid   Type 2 diabetes mellitus with peripheral neuropathy Oral medications on hold BG overall somewhat elevated Continue RISS Lantus 8 qhs Continue Neurontin   Essential hypertension Better controlled Continue Toprol, clonidine, amlodipine Re-started arb as kidney function has returned to baseline. irbesartan 75 for home telmisartan   Morbid obesity BMI 95.62 This complicates overall care and prognosis Nutrition consulted   Poor self-care TOC consulted DSS is following   DVT prophylaxis: SQ Lovenox Code Status: Full Family Communication: patient requests no calls Disposition Plan: Status is: Inpatient  Remains inpatient appropriate because: unsafe d/c plan       Level of care: Med-Surg  Consultants:  Vascular surgery Podiatry  Procedures:  Right lower extremity angiogram 11/1 Left lower  extremity angiogram 11/4 Surgical debridement 11/7  Antimicrobials: See above   Subjective: Patient seen and  examined.  Mild toe pain. Tolerating diet. Vertigo with head movement to side.  Objective: Vitals:   05/06/21 1523 05/06/21 2027 05/07/21 0541 05/07/21 0734  BP: 120/88 (!) 127/49 (!) 132/47 (!) 160/57  Pulse: (!) 59 (!) 58 (!) 57 (!) 56  Resp: 19   18  Temp: 98 F (36.7 C) 98.7 F (37.1 C) 98.1 F (36.7 C) 97.9 F (36.6 C)  TempSrc: Oral Oral Oral   SpO2: 97% 96% 96% 97%  Weight:      Height:        Intake/Output Summary (Last 24 hours) at 05/07/2021 1240 Last data filed at 05/07/2021 1007 Gross per 24 hour  Intake 540 ml  Output 700 ml  Net -160 ml   Filed Weights   04/16/21 2244 04/17/21 0403 05/03/21 1640  Weight: 122.5 kg 118.5 kg 118.5 kg    Examination:  General exam: Mild distress due to pain Respiratory system: No adventitious sounds.  Normal work of breathing.  Room air Cardiovascular system: S1-S2, RRR, no murmurs Gastrointestinal system: Obese, NT/ND, normal bowel sounds Central nervous system: Alert and oriented. No focal neurological deficits. Extremities: Decreased power symmetrically.  Bilateral lower extremities in surgical wraps.  Not removed.    From 11/9:    Psychiatry: Judgement and insight appear normal. Mood & affect appropriate.     Data Reviewed: I have personally reviewed following labs and imaging studies  CBC: Recent Labs  Lab 05/03/21 0752 05/04/21 0729 05/06/21 0624  WBC 8.1 8.6 6.6  NEUTROABS 6.0 6.2  --   HGB 9.2* 8.9* 8.6*  HCT 30.1* 30.1* 27.8*  MCV 98.7 99.7 98.9  PLT 327 331 557   Basic Metabolic Panel: Recent Labs  Lab 05/03/21 0752 05/04/21 0729 05/06/21 0624 05/07/21 0418  NA 140 140 138 139  K 4.7 4.5 4.2 4.2  CL 112* 112* 113* 115*  CO2 20* 20* 20* 20*  GLUCOSE 160* 146* 165* 125*  BUN _0 CREATININE 1.52* 1.52* 1.32* 1.27*  CALCIUM 8.3* 8.2* 8.1* 8.0*   GFR: Estimated  Creatinine Clearance: 55.4 mL/min (A) (by C-G formula based on SCr of 1.27 mg/dL (H)). Liver Function Tests: No results for input(s): AST, ALT, ALKPHOS, BILITOT, PROT, ALBUMIN in the last 168 hours. No results for input(s): LIPASE, AMYLASE in the last 168 hours. No results for input(s): AMMONIA in the last 168 hours. Coagulation Profile: Recent Labs  Lab 05/03/21 0752  INR 1.5*   Cardiac Enzymes: No results for input(s): CKTOTAL, CKMB, CKMBINDEX, TROPONINI in the last 168 hours. BNP (last 3 results) No results for input(s): PROBNP in the last 8760 hours. HbA1C: No results for input(s): HGBA1C in the last 72 hours. CBG: Recent Labs  Lab 05/06/21 1136 05/06/21 1632 05/06/21 2021 05/07/21 0734 05/07/21 1136  GLUCAP 154* 129* 125* 125* 172*   Lipid Profile: No results for input(s): CHOL, HDL, LDLCALC, TRIG, CHOLHDL, LDLDIRECT in the last 72 hours. Thyroid Function Tests: No results for input(s): TSH, T4TOTAL, FREET4, T3FREE, THYROIDAB in the last 72 hours. Anemia Panel: No results for input(s): VITAMINB12, FOLATE, FERRITIN, TIBC, IRON, RETICCTPCT in the last 72 hours. Sepsis Labs: No results for input(s): PROCALCITON, LATICACIDVEN in the last 168 hours.  No results found for this or any previous visit (from the past 240 hour(s)).        Radiology Studies: No results found.      Scheduled Meds:  acidophilus  2 capsule Oral Daily   amLODipine  5 mg Oral Daily  atorvastatin  40 mg Oral Daily   cephALEXin  500 mg Oral Q6H   cloNIDine  0.3 mg Oral TID   clopidogrel  75 mg Oral Q breakfast   doxycycline  100 mg Oral Q12H   enoxaparin (LOVENOX) injection  0.5 mg/kg Subcutaneous Q24H   hydrocerin   Topical Daily   insulin aspart  0-15 Units Subcutaneous TID WC   insulin aspart  0-5 Units Subcutaneous QHS   insulin glargine-yfgn  8 Units Subcutaneous QHS   irbesartan  75 mg Oral Daily   levothyroxine  112 mcg Oral Q0600   metoprolol succinate  50 mg Oral Q  breakfast   sodium chloride flush  3 mL Intravenous Q12H   Continuous Infusions:     LOS: 20 days    Time spent: 20 minutes    Desma Maxim, MD [facility]   If 7PM-7AM, please contact night-coverage  05/07/2021, 12:40 PM

## 2021-05-07 NOTE — Plan of Care (Signed)

## 2021-05-07 NOTE — Progress Notes (Signed)
Physical Therapy Treatment Patient Details Name: [name] MRN: [id number] DOB: 08/03/1955 Today's Date: 05/07/2021   History of Present Illness 65 y.o. female with a past medical history of DM, HTN, hypothyroidism, kidney stones, lymphedema, morbid obesity and plantar fasciitis followed by podiatry outpatient for some chronic wounds and edema in the lower extremity who presents via EMS after a welfare check was made on the patient with neighbors concerned that patient has been stuck in her recliner for several days.  Patient states her feet have been hurting her for over a month got particularly bad over the last couple of days preventing her from standing or walking. Pt underwent R angioplasty and stent placement on 04/27/21. On 05/03/21, pt underwent right posterior lower leg ulceration wound debridement, left lower leg ulceration wound debridement, left heel wound debridement, right partial hallux amputation, and right partial fifth ray amputation.   PT Comments    Pt was pleasant and motivated to participate during the session and put forth good effort throughout. Pt reported mild dizziness when turning to the right but no nystagmus was noted with L/R head turns. Pt required min assist +1 when sitting up to the EOB for LLE control. Pt was able to maintain balance on EOB without LE support. Pt did not report any dizziness upon coming to sitting from supine until prompted and pt stated she felt "a little dizzy, but better than before." When returning to supine, pt required min assist +2 for bilat LE support and with NAD and at no point mentioning dizziness. Pt will benefit from PT services in a SNF setting upon discharge to safely address deficits listed in patient problem list for decreased caregiver assistance and eventual return to PLOF.   Recommendations for follow up therapy are one component of a multi-disciplinary discharge planning process, led by the attending physician.  Recommendations  may be updated based on patient status, additional functional criteria and insurance authorization.  Follow Up Recommendations  Skilled nursing-short term rehab (<3 hours/day)     Assistance Recommended at Discharge Frequent or constant Supervision/Assistance  Equipment Recommendations  Other (comment) (defer to post acute)    Recommendations for Other Services       Precautions / Restrictions Precautions Precautions: Fall Restrictions Weight Bearing Restrictions: Yes RLE Weight Bearing: Partial weight bearing RLE Partial Weight Bearing Percentage or Pounds: PWB to RLE w/ post op shoe, WB on heel only LLE Weight Bearing: Non weight bearing LLE Partial Weight Bearing Percentage or Pounds: Toe touch WB to forefoot only, no heel WB, for transfers only w/ post-op shoe donned     Mobility  Bed Mobility Overal bed mobility: Needs Assistance Bed Mobility: Supine to Sit;Sit to Supine Rolling: Max assist;+2 for physical assistance   Supine to sit: Min assist Sit to supine: Min assist;+2 for physical assistance   General bed mobility comments: Min assist +1 for supine to sit for control of LLE, Min assist +2 for sit to supine for control of LLE, increased time and effort    Transfers                   General transfer comment: Pt unable to clear surface of mattress with max effort and heavy cuing for WB compliance    Ambulation/Gait               General Gait Details: NT   Stairs             Wheelchair Mobility    Modified Rankin (  Stroke Patients Only)       Balance Overall balance assessment: Needs assistance Sitting-balance support: Feet unsupported;Bilateral upper extremity supported Sitting balance-Leahy Scale: Fair   Postural control: Right lateral lean     Standing balance comment: NT                            Cognition Arousal/Alertness: Awake/alert Behavior During Therapy: WFL for tasks assessed/performed;Flat  affect Overall Cognitive Status: Within Functional Limits for tasks assessed                                          Exercises Other Exercises: BLE LAQ's x 10 each Other Exercises: Pt education on weight bearing restictions when performing minimal EOB WB activity Other Exercises: Low intensity pre-transfer training with gentle force applied to appropriate area of Bil feet x 10 each with heavy cuing for WB compliance    General Comments        Pertinent Vitals/Pain Pain Assessment: 0-10 Pain Score: 2  Faces Pain Scale: Hurts a little bit Pain Location: L knee Pain Descriptors / Indicators: Aching Pain Intervention(s): Repositioned    Home Living                          Prior Function            PT Goals (current goals can now be found in the care plan section) Acute Rehab PT Goals Patient Stated Goal: to go home PT Goal Formulation: With patient Time For Goal Achievement: 05/17/21 Potential to Achieve Goals: Fair Progress towards PT goals: Progressing toward goals    Frequency    Min 2X/week      PT Plan Current plan remains appropriate    Co-evaluation              AM-PAC PT "6 Clicks" Mobility   Outcome Measure  Help needed turning from your back to your side while in a flat bed without using bedrails?: A Lot Help needed moving from lying on your back to sitting on the side of a flat bed without using bedrails?: A Lot Help needed moving to and from a bed to a chair (including a wheelchair)?: Total Help needed standing up from a chair using your arms (e.g., wheelchair or bedside chair)?: Total Help needed to walk in hospital room?: Total Help needed climbing 3-5 steps with a railing? : Total 6 Click Score: 8    End of Session   Activity Tolerance: Patient tolerated treatment well Patient left: in bed;with call bell/phone within reach;with bed alarm set;with family/visitor present Nurse Communication: Mobility  status PT Visit Diagnosis: Muscle weakness (generalized) (M62.81);Difficulty in walking, not elsewhere classified (R26.2);Pain Pain - part of body: Leg     Time: 1540-1610 PT Time Calculation (min) (ACUTE ONLY): 30 min  Charges:                        Sheldon Silvan SPT 05/07/21, 4:46 PM

## 2021-05-07 NOTE — Progress Notes (Signed)
Occupational Therapy Treatment Patient Details Name: [name] MRN: [id number] DOB: 1955/07/17 Today's Date: 05/07/2021   History of present illness 65 y.o. female with a past medical history of DM, HTN, hypothyroidism, kidney stones, lymphedema, morbid obesity and plantar fasciitis followed by podiatry outpatient for some chronic wounds and edema in the lower extremity who presents via EMS after a welfare check was made on the patient with neighbors concerned that patient has been stuck in her recliner for several days.  Patient states her feet have been hurting her for over a month got particularly bad over the last couple of days preventing her from standing or walking. Pt underwent R angioplasty and stent placement on 04/27/21. On 05/03/21, pt underwent right posterior lower leg ulceration wound debridement, left lower leg ulceration wound debridement, left heel wound debridement, right partial hallux amputation, and right partial fifth ray amputation.   OT comments  [name] seen for OT treatment on this date. Upon arrival to room pt awake/alert and in bed. Pt eager to work with therapy. Pt currently requiring SETUP + CGA for grooming- toothbrushing, hairbrushing, hairwashing, seated EOB ~20 min. Pt making good progress toward goals. Pt reports extreme dizziness with rolling requiring MAX Ax2. Pt continues to benefit from skilled OT services to maximize return to PLOF and minimize risk of future falls, injury, caregiver burden, and readmission. Will continue to follow POC. Discharge recommendation remains appropriate.     Recommendations for follow up therapy are one component of a multi-disciplinary discharge planning process, led by the attending physician.  Recommendations may be updated based on patient status, additional functional criteria and insurance authorization.    Follow Up Recommendations  Skilled nursing-short term rehab (<3 hours/day)    Assistance Recommended at Discharge  Frequent or constant Supervision/Assistance  Equipment Recommendations  Other (comment) (defer to next venue of care)    Recommendations for Other Services      Precautions / Restrictions Precautions Precautions: Fall Restrictions Weight Bearing Restrictions: Yes RLE Weight Bearing: Partial weight bearing LLE Weight Bearing: Non weight bearing Other Position/Activity Restrictions: right heel transfer only, left toe touch/ L heel NWB       Mobility Bed Mobility Overal bed mobility: Needs Assistance Bed Mobility: Supine to Sit;Sit to Supine;Rolling Rolling: Max assist;+2 for physical assistance   Supine to sit: Mod assist;+2 for physical assistance Sit to supine: Max assist;+2 for physical assistance        Transfers                         Balance Overall balance assessment: Needs assistance Sitting-balance support: Feet supported;No upper extremity supported Sitting balance-Leahy Scale: Fair                                     ADL either performed or assessed with clinical judgement   ADL Overall ADL's : Needs assistance/impaired                                       General ADL Comments: Set-up + CGA for grooming- toothbrushing, hairbrushing, hairwashing, seated EOB ~20.    Extremity/Trunk Assessment               Cognition Arousal/Alertness: Awake/alert Behavior During Therapy: WFL for tasks assessed/performed Overall Cognitive Status: Within Functional Limits for  tasks assessed                                            Exercises Exercises: Other exercises Other Exercises Other Exercises: Pt educ re: OT role, importance of mvmt for fucntional mobility, falls prevention Other Exercises: Sup<>sit, facewashing, toothbrushing, hairbrushing, hairwashing.           Pertinent Vitals/ Pain       Pain Assessment: Faces Faces Pain Scale: Hurts a little bit Pain Location: R knee Pain  Descriptors / Indicators: Aching Pain Intervention(s): Limited activity within patient's tolerance   Frequency  Min 1X/week        Progress Toward Goals  OT Goals(current goals can now be found in the care plan section)  Progress towards OT goals: Progressing toward goals  Acute Rehab OT Goals Patient Stated Goal: to go home OT Goal Formulation: With patient Time For Goal Achievement: 05/18/21 Potential to Achieve Goals: Fair ADL Goals Pt Will Perform Upper Body Dressing: with set-up;sitting Pt Will Perform Lower Body Dressing: with mod assist;sitting/lateral leans Pt Will Transfer to Toilet: with mod assist;with +2 assist;stand pivot transfer;bedside commode  Plan Discharge plan remains appropriate;Frequency remains appropriate    Co-evaluation                 AM-PAC OT "6 Clicks" Daily Activity     Outcome Measure   Help from another person eating meals?: None Help from another person taking care of personal grooming?: A Little Help from another person toileting, which includes using toliet, bedpan, or urinal?: Total Help from another person bathing (including washing, rinsing, drying)?: A Lot Help from another person to put on and taking off regular upper body clothing?: A Little Help from another person to put on and taking off regular lower body clothing?: A Lot 6 Click Score: 15    End of Session    OT Visit Diagnosis: Unsteadiness on feet (R26.81);Muscle weakness (generalized) (M62.81);Pain   Activity Tolerance Patient tolerated treatment well   Patient Left in bed;with call bell/phone within reach   Nurse Communication          Time: 1610-9604 OT Time Calculation (min): 34 min  Charges: OT General Charges $OT Visit: 1 Visit OT Treatments $Self Care/Home Management : 23-37 mins  Nino Glow, Markus Daft 05/07/2021, 4:03 PM

## 2021-05-08 DIAGNOSIS — F4322 Adjustment disorder with anxiety: Secondary | ICD-10-CM | POA: Diagnosis not present

## 2021-05-08 LAB — GLUCOSE, CAPILLARY
Glucose-Capillary: 135 mg/dL — ABNORMAL HIGH (ref 70–99)
Glucose-Capillary: 118 mg/dL — ABNORMAL HIGH (ref 70–99)
Glucose-Capillary: 99 mg/dL (ref 70–99)
Glucose-Capillary: 170 mg/dL — ABNORMAL HIGH (ref 70–99)

## 2021-05-08 MED ORDER — IRBESARTAN 150 MG PO TABS
75.0000 mg | ORAL_TABLET | Freq: Once | ORAL | Status: AC
  Administered 2021-05-08: 75 mg via ORAL
  Filled 2021-05-08: qty 1

## 2021-05-08 MED ORDER — IRBESARTAN 150 MG PO TABS
150.0000 mg | ORAL_TABLET | Freq: Every day | ORAL | Status: DC
  Administered 2021-05-09 – 2021-05-18 (×10): 150 mg via ORAL
  Filled 2021-05-08 (×10): qty 1

## 2021-05-08 NOTE — Progress Notes (Signed)
PROGRESS NOTE    [name]  IRW:[id number] DOB: [birth year] DOA: 04/16/2021 PCP: Juline Patch, MD    Brief Narrative:  unfortunate 65 year old female with extensive past medical history who was brought in via EMS for evaluation of bilateral lower extremity redness swelling and pain.  Apparently patient has been in her wheelchair at home unable to move for several days.  Lower extremity significant cellulitic changes.  On broad-spectrum antibiotics.  Also some question of fracture in the feet on the right lower extremity.   Podiatry consulted on admission,Concern regarding vascular supply lower extremities.  Podiatry apparently reached out to vascular surgery for consultation prior to any surgical intervention.  Awaiting vascular evaluation to assess lower extremity vasculopathy. WOC following. ABI completed with results significant for severe bilateral peripheral arterial disease.    Angiogram delayed several times due to patient refusal and concerned about capacity.  Angiogram completed on 11/1.  Successful recanalization right lower extremity for limb salvage.  Podiatry is deferring surgical intervention until we have angiography and revascularization attempt on both lower extremities.  Now the right is been completed the patient is apparently refusing left lower extremity angiogram.  Vascular surgery is offered general anesthesia but at this time the patient continues to refuse.  11/4: Patient agreed to angiography this morning.  Notify vascular surgery 11/5: Status post angiography.  Appreciate podiatry follow-up.  Current plan for surgical intervention with Dr. Luana Shu on 11/7 11/7: N.p.o. for debridement with podiatry today.  Patient aware of surgical plan and in agreement.  N.p.o. 11/8: Postop day 1 status post extensive debridement and wound VAC placement     Assessment & Plan:  Sepsis Severe bilateral lower extremity cellulitis, nonpurulent Sepsis criteria met with  tachycardia and leukocytosis.  Presumed source bilateral lower extremity nonpurulent cellulitis Concern regarding vascular supply Podiatry and vascular consulted MRSA screen negative Treated with vanc/cefepime then unasyn, iv abx stopped 11/8 -Angiogram with successful recanalization right lower extremity for limb salvage on 11/1 -Angiogram of left lower extremity successful on 11/4 -Status post extensive debridement and wound VAC placement with podiatry on 11/7 Plan: Postoperative wound care Continue wound vac Clean margins, podiatry advises 5 days abx, started keflex/doxy on 11/9 Podiatry advises: - finish abx - 3x weekly dry dressing change to right foot - 3x weekly wound vac changes left heel - 3x weekly xeroform dressings to lower extremity wounds - f/u wound center (patient is established there) - f/u podiatry 2 weeks dr. Vickki Muff - nonweightbearing left heel, weightbearing right heel as needed for transfer  BPPV Patient reports several days vertigo provoked by head movement relieved w/ rest, no tinnitus or ha or hearing loss. - pt eval/tx ordered, will touch base w/ them as they saw pt yesterday but don't appear to have addressed her vertigo   Acute kidney injury on chronic kidney disease stage IIIb Aki resolved, cr stable at 1.3 - monitor   Hypothyroidism Continue Synthroid   Type 2 diabetes mellitus with peripheral neuropathy Oral medications on hold BG overall somewhat elevated Continue RISS Lantus 8 qhs Continue Neurontin   Essential hypertension Better controlled Continue Toprol, clonidine, amlodipine Re-started arb as kidney function has returned to baseline. Will increase irbesartan to 150  Morbid obesity BMI 76.19 This complicates overall care and prognosis Nutrition consulted   Poor self-care TOC consulted DSS is following   DVT prophylaxis: SQ Lovenox Code Status: Full Family Communication: patient requests no calls Disposition Plan: Status is:  Inpatient  Remains inpatient appropriate because: unsafe d/c plan  Level of care: Med-Surg  Consultants:  Vascular surgery Podiatry  Procedures:  Right lower extremity angiogram 11/1 Left lower extremity angiogram 11/4 Surgical debridement 11/7  Antimicrobials: See above   Subjective: Patient seen and examined.  Mild toe pain. Tolerating diet. Vertigo with head movement to side.  Objective: Vitals:   05/07/21 1611 05/07/21 1927 05/08/21 0430 05/08/21 0739  BP: (!) 132/41 (!) 141/58 (!) 148/60 (!) 156/60  Pulse: (!) 58 (!) 55 (!) 57 (!) 57  Resp: $Remo'18 16 17 18  'joLgW$ Temp: 98.2 F (36.8 C) (!) 97.4 F (36.3 C) 98.4 F (36.9 C) 98 F (36.7 C)  TempSrc:  Oral Oral Oral  SpO2: 98% 97% 98% 98%  Weight:      Height:        Intake/Output Summary (Last 24 hours) at 05/08/2021 1429 Last data filed at 05/08/2021 1100 Gross per 24 hour  Intake 243 ml  Output 650 ml  Net -407 ml   Filed Weights   04/16/21 2244 04/17/21 0403 05/03/21 1640  Weight: 122.5 kg 118.5 kg 118.5 kg    Examination:  General exam: Mild distress due to pain Respiratory system: No adventitious sounds.  Normal work of breathing.  Room air Cardiovascular system: S1-S2, RRR, no murmurs Gastrointestinal system: Obese, NT/ND, normal bowel sounds Central nervous system: Alert and oriented. No focal neurological deficits. Extremities: Decreased power symmetrically.  Bilateral lower extremities in surgical wraps.  Not removed.    From 11/9:    Psychiatry: Judgement and insight appear normal. Mood & affect appropriate.     Data Reviewed: I have personally reviewed following labs and imaging studies  CBC: Recent Labs  Lab 05/03/21 0752 05/04/21 0729 05/06/21 0624  WBC 8.1 8.6 6.6  NEUTROABS 6.0 6.2  --   HGB 9.2* 8.9* 8.6*  HCT 30.1* 30.1* 27.8*  MCV 98.7 99.7 98.9  PLT 327 331 704   Basic Metabolic Panel: Recent Labs  Lab 05/03/21 0752 05/04/21 0729 05/06/21 0624 05/07/21 0418   NA 140 140 138 139  K 4.7 4.5 4.2 4.2  CL 112* 112* 113* 115*  CO2 20* 20* 20* 20*  GLUCOSE 160* 146* 165* 125*  BUN $Re'20 19 15 17  'Pbv$ CREATININE 1.52* 1.52* 1.32* 1.27*  CALCIUM 8.3* 8.2* 8.1* 8.0*   GFR: Estimated Creatinine Clearance: 55.4 mL/min (A) (by C-G formula based on SCr of 1.27 mg/dL (H)). Liver Function Tests: No results for input(s): AST, ALT, ALKPHOS, BILITOT, PROT, ALBUMIN in the last 168 hours. No results for input(s): LIPASE, AMYLASE in the last 168 hours. No results for input(s): AMMONIA in the last 168 hours. Coagulation Profile: Recent Labs  Lab 05/03/21 0752  INR 1.5*   Cardiac Enzymes: No results for input(s): CKTOTAL, CKMB, CKMBINDEX, TROPONINI in the last 168 hours. BNP (last 3 results) No results for input(s): PROBNP in the last 8760 hours. HbA1C: No results for input(s): HGBA1C in the last 72 hours. CBG: Recent Labs  Lab 05/07/21 1136 05/07/21 1611 05/07/21 2045 05/08/21 0736 05/08/21 1146  GLUCAP 172* 150* 128* 135* 118*   Lipid Profile: No results for input(s): CHOL, HDL, LDLCALC, TRIG, CHOLHDL, LDLDIRECT in the last 72 hours. Thyroid Function Tests: No results for input(s): TSH, T4TOTAL, FREET4, T3FREE, THYROIDAB in the last 72 hours. Anemia Panel: No results for input(s): VITAMINB12, FOLATE, FERRITIN, TIBC, IRON, RETICCTPCT in the last 72 hours. Sepsis Labs: No results for input(s): PROCALCITON, LATICACIDVEN in the last 168 hours.  No results found for this or any previous visit (from the  past 240 hour(s)).        Radiology Studies: No results found.      Scheduled Meds:  acidophilus  2 capsule Oral Daily   amLODipine  5 mg Oral Daily   atorvastatin  40 mg Oral Daily   cephALEXin  500 mg Oral Q6H   cloNIDine  0.3 mg Oral TID   clopidogrel  75 mg Oral Q breakfast   doxycycline  100 mg Oral Q12H   enoxaparin (LOVENOX) injection  0.5 mg/kg Subcutaneous Q24H   insulin aspart  0-15 Units Subcutaneous TID WC   insulin aspart   0-5 Units Subcutaneous QHS   insulin glargine-yfgn  8 Units Subcutaneous QHS   irbesartan  75 mg Oral Daily   levothyroxine  112 mcg Oral Q0600   metoprolol succinate  50 mg Oral Q breakfast   sodium chloride flush  3 mL Intravenous Q12H   Continuous Infusions:     LOS: 21 days    Time spent: 20 minutes    Desma Maxim, MD [facility]   If 7PM-7AM, please contact night-coverage  05/08/2021, 2:29 PM

## 2021-05-08 NOTE — TOC Progression Note (Signed)
Transition of Care [facility]) - Progression Note    Patient Details  Name: [name] MRN: [id number] Date of Birth: 11/04/1955  Transition of Care [facility]) CM/SW [place], LCSW Phone Number: 05/08/2021, 9:05 AM  Clinical Narrative:   Reached out to [place] with [facility] and asked to be notified if they obtain insurance auth this weekend.     Expected Discharge Plan: [place] Barriers to Discharge: Continued Medical Work up  Expected Discharge Plan and Services Expected Discharge Plan: [place] arrangements for the past 2 months: Single Family Home                                       Social Determinants of Health (SDOH) Interventions    Readmission Risk Interventions No flowsheet data found.

## 2021-05-09 DIAGNOSIS — F4322 Adjustment disorder with anxiety: Secondary | ICD-10-CM | POA: Diagnosis not present

## 2021-05-09 LAB — GLUCOSE, CAPILLARY
Glucose-Capillary: 114 mg/dL — ABNORMAL HIGH (ref 70–99)
Glucose-Capillary: 103 mg/dL — ABNORMAL HIGH (ref 70–99)
Glucose-Capillary: 96 mg/dL (ref 70–99)
Glucose-Capillary: 136 mg/dL — ABNORMAL HIGH (ref 70–99)

## 2021-05-09 NOTE — Progress Notes (Signed)
PT Cancellation Note  Patient Details Name: [name] MRN: [id number] DOB: 07/13/1955   Cancelled Treatment:    Reason Eval/Treat Not Completed: Other (comment).  Chart reviewed.  Pt on bedpan upon PT arrival.  Discussed pt's dizziness/symptoms and reason for vestibular PT.  Educated pt on testing for BPPV (Dix-Hallpike) and what treatment procedure would entail (Epley maneuver); also educated pt on risks, precautions, and contraindications.  Pt reporting concern of back pain with positioning (for testing and procedure) so deferred further vestibular testing today (pt plans to think about it and address it another time).  Nurse notified pt still on bedpan.  Leitha Bleak, PT 05/09/21, 3:39 PM

## 2021-05-09 NOTE — Progress Notes (Signed)
PROGRESS NOTE    [name]  PXT:[id number] DOB: Oct 30, 1955 DOA: 04/16/2021 PCP: Juline Patch, MD    Brief Narrative:  unfortunate 65 year old female with extensive past medical history who was brought in via EMS for evaluation of bilateral lower extremity redness swelling and pain.  Apparently patient has been in her wheelchair at home unable to move for several days.  Lower extremity significant cellulitic changes.  On broad-spectrum antibiotics.  Also some question of fracture in the feet on the right lower extremity.   Podiatry consulted on admission,Concern regarding vascular supply lower extremities.  Podiatry apparently reached out to vascular surgery for consultation prior to any surgical intervention.  Awaiting vascular evaluation to assess lower extremity vasculopathy. WOC following. ABI completed with results significant for severe bilateral peripheral arterial disease.    Angiogram delayed several times due to patient refusal and concerned about capacity.  Angiogram completed on 11/1.  Successful recanalization right lower extremity for limb salvage.  Podiatry is deferring surgical intervention until we have angiography and revascularization attempt on both lower extremities.  Now the right is been completed the patient is apparently refusing left lower extremity angiogram.  Vascular surgery is offered general anesthesia but at this time the patient continues to refuse.  11/4: Patient agreed to angiography this morning.  Notify vascular surgery 11/5: Status post angiography.  Appreciate podiatry follow-up.  Current plan for surgical intervention with Dr. Luana Shu on 11/7 11/7: N.p.o. for debridement with podiatry today.  Patient aware of surgical plan and in agreement.  N.p.o. 11/8: Postop day 1 status post extensive debridement and wound VAC placement 11/11: podiatry has placed wound care and f/u recs. Stable for discharge, awaiting snf insurance authorization     Assessment  & Plan:  Sepsis Severe bilateral lower extremity cellulitis, nonpurulent Sepsis criteria met with tachycardia and leukocytosis.  Presumed source bilateral lower extremity nonpurulent cellulitis Concern regarding vascular supply Podiatry and vascular consulted MRSA screen negative Treated with vanc/cefepime then unasyn, iv abx stopped 11/8 -Angiogram with successful recanalization right lower extremity for limb salvage on 11/1 -Angiogram of left lower extremity successful on 11/4 -Status post extensive debridement and wound VAC placement with podiatry on 11/7 Plan: Postoperative wound care Continue wound vac Clean margins, podiatry advises 5 days abx, started keflex/doxy on 11/9 Podiatry advises: - finish abx - 3x weekly dry dressing change to right foot - 3x weekly wound vac changes left heel - 3x weekly xeroform dressings to lower extremity wounds - f/u wound center (patient is established there) - f/u podiatry 2 weeks dr. Vickki Muff - nonweightbearing left heel, weightbearing right heel as needed for transfer  BPPV Patient reports several days vertigo provoked by head movement relieved w/ rest, no tinnitus or ha or hearing loss. - pt eval/tx ordered, not in house this weekend, should be able to see tomorrow   Acute kidney injury on chronic kidney disease stage IIIb Aki resolved, cr stable at 1.3 - monitor   Hypothyroidism Continue Synthroid   Type 2 diabetes mellitus with peripheral neuropathy Oral medications on hold BG overall somewhat elevated Continue RISS Lantus 8 qhs Continue Neurontin   Essential hypertension Better controlled Continue Toprol, clonidine, amlodipine Re-started arb as kidney function has returned to baseline. have increased irbesartan to 150  Morbid obesity BMI 62.70 This complicates overall care and prognosis Nutrition consulted   Poor self-care TOC consulted DSS is following   DVT prophylaxis: SQ Lovenox Code Status: Full Family  Communication: patient requests no calls Disposition Plan: Status is:  Inpatient  Remains inpatient appropriate because: unsafe d/c plan       Level of care: Med-Surg  Consultants:  Vascular surgery Podiatry  Procedures:  Right lower extremity angiogram 11/1 Left lower extremity angiogram 11/4 Surgical debridement 11/7  Antimicrobials: See above   Subjective: Patient seen and examined.  No pain. Tolerating diet. Vertigo with head movement to side.  Objective: Vitals:   05/08/21 1600 05/08/21 1944 05/09/21 0411 05/09/21 0732  BP: 140/62 (!) 153/55 (!) 159/63 (!) 158/56  Pulse: (!) 56 (!) 57 (!) 56 (!) 50  Resp: $Remo'18 20 18 18  '[place]Xdy$ Temp: 98 F (36.7 C) 98.1 F (36.7 C) 97.9 F (36.6 C) (!) 97.3 F (36.3 C)  TempSrc: Oral Oral Oral Oral  SpO2: 94% 97% 100% 99%  Weight:      Height:        Intake/Output Summary (Last 24 hours) at 05/09/2021 1217 Last data filed at 05/09/2021 0703 Gross per 24 hour  Intake 0 ml  Output 500 ml  Net -500 ml   Filed Weights   04/16/21 2244 04/17/21 0403 05/03/21 1640  Weight: 122.5 kg 118.5 kg 118.5 kg    Examination:  General exam: Mild distress due to pain Respiratory system: No adventitious sounds.  Normal work of breathing.  Room air Cardiovascular system: S1-S2, RRR, no murmurs Gastrointestinal system: Obese, NT/ND, normal bowel sounds Central nervous system: Alert and oriented. No focal neurological deficits. Extremities: Decreased power symmetrically.  Bilateral lower extremities in surgical wraps.  Not removed.    From 11/9:    Psychiatry: Judgement and insight appear normal. Mood & affect appropriate.     Data Reviewed: I have personally reviewed following labs and imaging studies  CBC: Recent Labs  Lab 05/03/21 0752 05/04/21 0729 05/06/21 0624  WBC 8.1 8.6 6.6  NEUTROABS 6.0 6.2  --   HGB 9.2* 8.9* 8.6*  HCT 30.1* 30.1* 27.8*  MCV 98.7 99.7 98.9  PLT 327 331 973   Basic Metabolic Panel: Recent Labs   Lab 05/03/21 0752 05/04/21 0729 05/06/21 0624 05/07/21 0418  NA 140 140 138 139  K 4.7 4.5 4.2 4.2  CL 112* 112* 113* 115*  CO2 20* 20* 20* 20*  GLUCOSE 160* 146* 165* 125*  BUN $Re'20 19 15 17  'Mcg$ CREATININE 1.52* 1.52* 1.32* 1.27*  CALCIUM 8.3* 8.2* 8.1* 8.0*   GFR: Estimated Creatinine Clearance: 55.4 mL/min (A) (by C-G formula based on SCr of 1.27 mg/dL (H)). Liver Function Tests: No results for input(s): AST, ALT, ALKPHOS, BILITOT, PROT, ALBUMIN in the last 168 hours. No results for input(s): LIPASE, AMYLASE in the last 168 hours. No results for input(s): AMMONIA in the last 168 hours. Coagulation Profile: Recent Labs  Lab 05/03/21 0752  INR 1.5*   Cardiac Enzymes: No results for input(s): CKTOTAL, CKMB, CKMBINDEX, TROPONINI in the last 168 hours. BNP (last 3 results) No results for input(s): PROBNP in the last 8760 hours. HbA1C: No results for input(s): HGBA1C in the last 72 hours. CBG: Recent Labs  Lab 05/08/21 1146 05/08/21 1642 05/08/21 2026 05/09/21 0733 05/09/21 1150  GLUCAP 118* 99 170* 114* 103*   Lipid Profile: No results for input(s): CHOL, HDL, LDLCALC, TRIG, CHOLHDL, LDLDIRECT in the last 72 hours. Thyroid Function Tests: No results for input(s): TSH, T4TOTAL, FREET4, T3FREE, THYROIDAB in the last 72 hours. Anemia Panel: No results for input(s): VITAMINB12, FOLATE, FERRITIN, TIBC, IRON, RETICCTPCT in the last 72 hours. Sepsis Labs: No results for input(s): PROCALCITON, LATICACIDVEN in the last 168  hours.  No results found for this or any previous visit (from the past 240 hour(s)).        Radiology Studies: No results found.      Scheduled Meds:  acidophilus  2 capsule Oral Daily   amLODipine  5 mg Oral Daily   atorvastatin  40 mg Oral Daily   cephALEXin  500 mg Oral Q6H   cloNIDine  0.3 mg Oral TID   clopidogrel  75 mg Oral Q breakfast   doxycycline  100 mg Oral Q12H   enoxaparin (LOVENOX) injection  0.5 mg/kg Subcutaneous Q24H    insulin aspart  0-15 Units Subcutaneous TID WC   insulin aspart  0-5 Units Subcutaneous QHS   insulin glargine-yfgn  8 Units Subcutaneous QHS   irbesartan  150 mg Oral Daily   levothyroxine  112 mcg Oral Q0600   metoprolol succinate  50 mg Oral Q breakfast   sodium chloride flush  3 mL Intravenous Q12H   Continuous Infusions:     LOS: 22 days    Time spent: 20 minutes    Desma Maxim, MD [facility]   If 7PM-7AM, please contact night-coverage  05/09/2021, 12:17 PM

## 2021-05-10 DIAGNOSIS — F4322 Adjustment disorder with anxiety: Secondary | ICD-10-CM | POA: Diagnosis not present

## 2021-05-10 LAB — BASIC METABOLIC PANEL
Anion gap: 8 (ref 5–15)
BUN: 14 mg/dL (ref 8–23)
CO2: 20 mmol/L — ABNORMAL LOW (ref 22–32)
Calcium: 8.2 mg/dL — ABNORMAL LOW (ref 8.9–10.3)
Chloride: 109 mmol/L (ref 98–111)
Creatinine, Ser: 1.19 mg/dL — ABNORMAL HIGH (ref 0.44–1.00)
GFR, Estimated: 50 mL/min — ABNORMAL LOW (ref >60)
Glucose, Bld: 125 mg/dL — ABNORMAL HIGH (ref 70–99)
Potassium: 4 mmol/L (ref 3.5–5.1)
Sodium: 137 mmol/L (ref 135–145)

## 2021-05-10 LAB — CBC
HCT: 31.1 % — ABNORMAL LOW (ref 36.0–46.0)
Hemoglobin: 9.6 g/dL — ABNORMAL LOW (ref 12.0–15.0)
MCH: 29.7 pg (ref 26.0–34.0)
MCHC: 30.9 g/dL (ref 30.0–36.0)
MCV: 96.3 fL (ref 80.0–100.0)
Platelets: 280 10*3/uL (ref 150–400)
RBC: 3.23 MIL/uL — ABNORMAL LOW (ref 3.87–5.11)
RDW: 16.7 % — ABNORMAL HIGH (ref 11.5–15.5)
WBC: 4.4 10*3/uL (ref 4.0–10.5)
nRBC: 0 % (ref 0.0–0.2)

## 2021-05-10 LAB — GLUCOSE, CAPILLARY
Glucose-Capillary: 118 mg/dL — ABNORMAL HIGH (ref 70–99)
Glucose-Capillary: 155 mg/dL — ABNORMAL HIGH (ref 70–99)
Glucose-Capillary: 90 mg/dL (ref 70–99)
Glucose-Capillary: 103 mg/dL — ABNORMAL HIGH (ref 70–99)

## 2021-05-10 MED ORDER — FUROSEMIDE 40 MG PO TABS
40.0000 mg | ORAL_TABLET | Freq: Two times a day (BID) | ORAL | Status: DC
  Administered 2021-05-10: 40 mg via ORAL
  Filled 2021-05-10 (×2): qty 1

## 2021-05-10 NOTE — Progress Notes (Signed)
Physical Therapy Treatment Patient Details Name: [name] MRN: [id number] DOB: 1955-07-06 Today's Date: 05/10/2021   History of Present Illness 65 y.o. female with a past medical history of DM, HTN, hypothyroidism, kidney stones, lymphedema, morbid obesity and plantar fasciitis followed by podiatry outpatient for some chronic wounds and edema in the lower extremity who presents via EMS after a welfare check was made on the patient with neighbors concerned that patient has been stuck in her recliner for several days.  Patient states her feet have been hurting her for over a month got particularly bad over the last couple of days preventing her from standing or walking. Pt underwent R angioplasty and stent placement on 04/27/21. On 05/03/21, pt underwent right posterior lower leg ulceration wound debridement, left lower leg ulceration wound debridement, left heel wound debridement, right partial hallux amputation, and right partial fifth ray amputation.   PT Comments    Pt was pleasant and agreeable to participate during the session and put forth good effort throughout. Per MD orders/request, PT performed Dix-Hallpike, which brought on mild symptoms but was otherwise negative. Pt was symptomatic when turning her head to the right and rolling to the right, therefore PT performed Epley maneuver to the right. Pt was still symptomatic following treatment. Pt was able to complete all ther ex in bed. Pt required max assist +2 to roll in bed and was unable to maintain sidelying position assistance without support. Pt required max assist +2 to sit EOB from sidelying with bilat LE control. Pt will benefit from PT services in a SNF setting upon discharge to safely address deficits listed in patient problem list for decreased caregiver assistance and eventual return to PLOF.   Recommendations for follow up therapy are one component of a multi-disciplinary discharge planning process, led by the attending  physician.  Recommendations may be updated based on patient status, additional functional criteria and insurance authorization.  Follow Up Recommendations  Skilled nursing-short term rehab (<3 hours/day)     Assistance Recommended at Discharge Frequent or constant Supervision/Assistance  Equipment Recommendations  Other (comment) (defer to post acute)    Recommendations for Other Services       Precautions / Restrictions Precautions Precautions: Fall Restrictions Weight Bearing Restrictions: Yes RLE Partial Weight Bearing Percentage or Pounds: PWB to RLE w/ post op shoe, WB on heel only LLE Weight Bearing: Non weight bearing LLE Partial Weight Bearing Percentage or Pounds: Toe touch WB to forefoot only, no heel WB, for tranfers only w/ post op shoe donned     Mobility  Bed Mobility Overal bed mobility: Needs Assistance Bed Mobility: Supine to Sit;Sit to Supine Rolling: Max assist;+2 for physical assistance         General bed mobility comments: Max asist +2 for rolling in bed, pt able to grab ahold of bed rails for assistance, increased time and effort    Transfers                   General transfer comment: NT    Ambulation/Gait               General Gait Details: NT   Stairs             Wheelchair Mobility    Modified Rankin (Stroke Patients Only)       Balance Overall balance assessment: Needs assistance Sitting-balance support: Bilateral upper extremity supported;Feet unsupported Sitting balance-Leahy Scale: Fair Sitting balance - Comments: Requires min assist when sitting on the EOB  Standing balance comment: NT                            Cognition Arousal/Alertness: Awake/alert Behavior During Therapy: WFL for tasks assessed/performed;Flat affect Overall Cognitive Status: Within Functional Limits for tasks assessed                                          Exercises Total Joint  Exercises Ankle Circles/Pumps: AROM;Both;10 reps;Supine;Strengthening Quad Sets: AROM;Strengthening;Both;10 reps;Supine Gluteal Sets: AROM;Strengthening;Both;10 reps;Supine Other Exercises Other Exercises: PT performed Marye Round and Epley maneuver    General Comments        Pertinent Vitals/Pain Pain Assessment: No/denies pain    Home Living                          Prior Function            PT Goals (current goals can now be found in the care plan section) Acute Rehab PT Goals Patient Stated Goal: to go home PT Goal Formulation: With patient Time For Goal Achievement: 05/17/21 Potential to Achieve Goals: Fair Progress towards PT goals: Progressing toward goals    Frequency    Min 2X/week      PT Plan Current plan remains appropriate    Co-evaluation              AM-PAC PT "6 Clicks" Mobility   Outcome Measure  Help needed turning from your back to your side while in a flat bed without using bedrails?: A Lot Help needed moving from lying on your back to sitting on the side of a flat bed without using bedrails?: A Lot Help needed moving to and from a bed to a chair (including a wheelchair)?: Total Help needed standing up from a chair using your arms (e.g., wheelchair or bedside chair)?: Total Help needed to walk in hospital room?: Total Help needed climbing 3-5 steps with a railing? : Total 6 Click Score: 8    End of Session   Activity Tolerance: Patient tolerated treatment well Patient left: in bed;with call bell/phone within reach;with bed alarm set Nurse Communication: Mobility status PT Visit Diagnosis: Muscle weakness (generalized) (M62.81);Difficulty in walking, not elsewhere classified (R26.2);Pain     Time: 3810-1751 PT Time Calculation (min) (ACUTE ONLY): 46 min  Charges:                        Sheldon Silvan SPT 05/10/21, 3:45 PM

## 2021-05-10 NOTE — TOC Progression Note (Addendum)
Transition of Care [facility]) - Progression Note    Patient Details  Name: [name] MRN: [id number] Date of Birth: Jul 03, 1955  Transition [facility] CM/SW [place], RN Phone Number: 05/10/2021, 1:51 PM  Clinical Narrative: Received a call from [name] that Authorization  was denied, Peer to Peer is required by 12N tomorrow, call [phone] opt.#3. Ref.# [id number]. Attending given secure chat information. Family appeal [phone] with reference number. Attempted to call friend number on file, no answer left message to return call.  2:30pm. Friend called back says patient does not have family that can call and patient can speak for herself, would prefer that patient call if needed. I called [facility] and spoke with Otila Kluver who says if attending do peer to peer no family call is required. Friend called and informed that the call is not needed if Attending calls.    Expected Discharge Plan: [place] Barriers to Discharge: Continued Medical Work up  Expected Discharge Plan and Services Expected Discharge Plan: [place] arrangements for the past 2 months: Single Family Home                                       Social Determinants of Health (SDOH) Interventions    Readmission Risk Interventions No flowsheet data found.

## 2021-05-10 NOTE — Progress Notes (Signed)
PROGRESS NOTE    [name]  EGB:[id number] DOB: March 07, 1956 DOA: 04/16/2021 PCP: Juline Patch, MD    Brief Narrative:  unfortunate 65 year old female with extensive past medical history who was brought in via EMS for evaluation of bilateral lower extremity redness swelling and pain.  Apparently patient has been in her wheelchair at home unable to move for several days.  Lower extremity significant cellulitic changes.  On broad-spectrum antibiotics.  Also some question of fracture in the feet on the right lower extremity.   Podiatry consulted on admission,Concern regarding vascular supply lower extremities.  Podiatry apparently reached out to vascular surgery for consultation prior to any surgical intervention.  Awaiting vascular evaluation to assess lower extremity vasculopathy. WOC following. ABI completed with results significant for severe bilateral peripheral arterial disease.    Angiogram delayed several times due to patient refusal and concerned about capacity.  Angiogram completed on 11/1.  Successful recanalization right lower extremity for limb salvage.  Podiatry is deferring surgical intervention until we have angiography and revascularization attempt on both lower extremities.  Now the right is been completed the patient is apparently refusing left lower extremity angiogram.  Vascular surgery is offered general anesthesia but at this time the patient continues to refuse.  11/4: Patient agreed to angiography this morning.  Notify vascular surgery 11/5: Status post angiography.  Appreciate podiatry follow-up.  Current plan for surgical intervention with Dr. Luana Shu on 11/7 11/7: N.p.o. for debridement with podiatry today.  Patient aware of surgical plan and in agreement.  N.p.o. 11/8: Postop day 1 status post extensive debridement and wound VAC placement 11/11: podiatry has placed wound care and f/u recs. Stable for discharge, awaiting snf insurance authorization     Assessment  & Plan:  Sepsis Severe bilateral lower extremity cellulitis, nonpurulent Sepsis criteria met with tachycardia and leukocytosis.  Presumed source bilateral lower extremity nonpurulent cellulitis Concern regarding vascular supply Podiatry and vascular consulted MRSA screen negative Treated with vanc/cefepime then unasyn, iv abx stopped 11/8 -Angiogram with successful recanalization right lower extremity for limb salvage on 11/1 -Angiogram of left lower extremity successful on 11/4 -Status post extensive debridement and wound VAC placement with podiatry on 11/7 Plan: Postoperative wound care Continue wound vac Clean margins, podiatry advises 5 days abx, started keflex/doxy on 11/9 Podiatry advises: - finish abx - 3x weekly dry dressing change to right foot - 3x weekly wound vac changes left heel - 3x weekly xeroform dressings to lower extremity wounds - f/u wound center (patient is established there) - f/u podiatry 2 weeks dr. Vickki Muff - nonweightbearing left heel, weightbearing right heel as needed for transfer  BPPV Patient reports several days vertigo provoked by head movement relieved w/ rest, no tinnitus or ha or hearing loss. - vestibular PT today   Acute kidney injury on chronic kidney disease stage IIIb Aki resolved, cr stable at 1.3 - monitor   Hypothyroidism Continue Synthroid   Type 2 diabetes mellitus with peripheral neuropathy Oral medications on hold BG overall somewhat elevated Continue RISS Lantus 8 qhs Continue Neurontin   Essential hypertension Better controlled Continue Toprol, clonidine, amlodipine Re-started arb as kidney function has returned to baseline. have increased irbesartan to 150 - resume home lasix today, bps up and kidney function improved  Morbid obesity BMI 37.10 This complicates overall care and prognosis Nutrition consulted   Poor self-care TOC consulted DSS is following   DVT prophylaxis: SQ Lovenox Code Status: Full Family  Communication: patient requests no calls Disposition Plan: Status is:  Inpatient  Remains inpatient appropriate because: unsafe d/c plan       Level of care: Med-Surg  Consultants:  Vascular surgery Podiatry  Procedures:  Right lower extremity angiogram 11/1 Left lower extremity angiogram 11/4 Surgical debridement 11/7  Antimicrobials: See above   Subjective: Patient seen and examined.  No pain. Tolerating diet. Vertigo with head movement to side.  Objective: Vitals:   05/09/21 1924 05/10/21 0412 05/10/21 0756 05/10/21 0817  BP: (!) 154/55 (!) 168/60 (!) 164/67   Pulse: (!) 54 (!) 54 (!) 54 65  Resp: $Remo'18 18 16   'zSsag$ Temp: 98.5 F (36.9 C) 98.4 F (36.9 C) 97.6 F (36.4 C)   TempSrc: Oral     SpO2: 100% 99% 100%   Weight:      Height:        Intake/Output Summary (Last 24 hours) at 05/10/2021 1432 Last data filed at 05/10/2021 0104 Gross per 24 hour  Intake --  Output 600 ml  Net -600 ml   Filed Weights   04/16/21 2244 04/17/21 0403 05/03/21 1640  Weight: 122.5 kg 118.5 kg 118.5 kg    Examination:  General exam: Mild distress due to pain Respiratory system: No adventitious sounds.  Normal work of breathing.  Room air Cardiovascular system: S1-S2, RRR, no murmurs Gastrointestinal system: Obese, NT/ND, normal bowel sounds Central nervous system: Alert and oriented. No focal neurological deficits. Extremities: Decreased power symmetrically.  Bilateral lower extremities in surgical wraps.  Not removed.    From 11/9:    Psychiatry: Judgement and insight appear normal. Mood & affect appropriate.     Data Reviewed: I have personally reviewed following labs and imaging studies  CBC: Recent Labs  Lab 05/04/21 0729 05/06/21 0624 05/10/21 0655  WBC 8.6 6.6 4.4  NEUTROABS 6.2  --   --   HGB 8.9* 8.6* 9.6*  HCT 30.1* 27.8* 31.1*  MCV 99.7 98.9 96.3  PLT 331 261 612   Basic Metabolic Panel: Recent Labs  Lab 05/04/21 0729 05/06/21 0624  05/07/21 0418 05/10/21 0655  NA 140 138 139 137  K 4.5 4.2 4.2 4.0  CL 112* 113* 115* 109  CO2 20* 20* 20* 20*  GLUCOSE 146* 165* 125* 125*  BUN $Re'19 15 17 14  'BDH$ CREATININE 1.52* 1.32* 1.27* 1.19*  CALCIUM 8.2* 8.1* 8.0* 8.2*   GFR: Estimated Creatinine Clearance: 59.1 mL/min (A) (by C-G formula based on SCr of 1.19 mg/dL (H)). Liver Function Tests: No results for input(s): AST, ALT, ALKPHOS, BILITOT, PROT, ALBUMIN in the last 168 hours. No results for input(s): LIPASE, AMYLASE in the last 168 hours. No results for input(s): AMMONIA in the last 168 hours. Coagulation Profile: No results for input(s): INR, PROTIME in the last 168 hours.  Cardiac Enzymes: No results for input(s): CKTOTAL, CKMB, CKMBINDEX, TROPONINI in the last 168 hours. BNP (last 3 results) No results for input(s): PROBNP in the last 8760 hours. HbA1C: No results for input(s): HGBA1C in the last 72 hours. CBG: Recent Labs  Lab 05/09/21 1150 05/09/21 1640 05/09/21 2122 05/10/21 0753 05/10/21 1150  GLUCAP 103* 96 136* 118* 155*   Lipid Profile: No results for input(s): CHOL, HDL, LDLCALC, TRIG, CHOLHDL, LDLDIRECT in the last 72 hours. Thyroid Function Tests: No results for input(s): TSH, T4TOTAL, FREET4, T3FREE, THYROIDAB in the last 72 hours. Anemia Panel: No results for input(s): VITAMINB12, FOLATE, FERRITIN, TIBC, IRON, RETICCTPCT in the last 72 hours. Sepsis Labs: No results for input(s): PROCALCITON, LATICACIDVEN in the last 168 hours.  No  results found for this or any previous visit (from the past 240 hour(s)).        Radiology Studies: No results found.      Scheduled Meds:  acidophilus  2 capsule Oral Daily   amLODipine  5 mg Oral Daily   atorvastatin  40 mg Oral Daily   cloNIDine  0.3 mg Oral TID   clopidogrel  75 mg Oral Q breakfast   enoxaparin (LOVENOX) injection  0.5 mg/kg Subcutaneous Q24H   insulin aspart  0-15 Units Subcutaneous TID WC   insulin aspart  0-5 Units  Subcutaneous QHS   insulin glargine-yfgn  8 Units Subcutaneous QHS   irbesartan  150 mg Oral Daily   levothyroxine  112 mcg Oral Q0600   metoprolol succinate  50 mg Oral Q breakfast   sodium chloride flush  3 mL Intravenous Q12H   Continuous Infusions:     LOS: 23 days    Time spent: 20 minutes    Desma Maxim, MD [facility]   If 7PM-7AM, please contact night-coverage  05/10/2021, 2:32 PM

## 2021-05-11 DIAGNOSIS — F4322 Adjustment disorder with anxiety: Secondary | ICD-10-CM | POA: Diagnosis not present

## 2021-05-11 LAB — GLUCOSE, CAPILLARY
Glucose-Capillary: 100 mg/dL — ABNORMAL HIGH (ref 70–99)
Glucose-Capillary: 120 mg/dL — ABNORMAL HIGH (ref 70–99)
Glucose-Capillary: 140 mg/dL — ABNORMAL HIGH (ref 70–99)
Glucose-Capillary: 73 mg/dL (ref 70–99)
Glucose-Capillary: 143 mg/dL — ABNORMAL HIGH (ref 70–99)

## 2021-05-11 LAB — BASIC METABOLIC PANEL
Anion gap: 8 (ref 5–15)
BUN: 11 mg/dL (ref 8–23)
CO2: 23 mmol/L (ref 22–32)
Calcium: 8.3 mg/dL — ABNORMAL LOW (ref 8.9–10.3)
Chloride: 107 mmol/L (ref 98–111)
Creatinine, Ser: 1.1 mg/dL — ABNORMAL HIGH (ref 0.44–1.00)
GFR, Estimated: 55 mL/min — ABNORMAL LOW (ref >60)
Glucose, Bld: 103 mg/dL — ABNORMAL HIGH (ref 70–99)
Potassium: 3.9 mmol/L (ref 3.5–5.1)
Sodium: 138 mmol/L (ref 135–145)

## 2021-05-11 LAB — RESP PANEL BY RT-PCR (FLU A&B, COVID) ARPGX2
Influenza A by PCR: NEGATIVE
Influenza B by PCR: NEGATIVE
SARS Coronavirus 2 by RT PCR: NEGATIVE

## 2021-05-11 MED ORDER — AMLODIPINE BESYLATE 10 MG PO TABS
10.0000 mg | ORAL_TABLET | Freq: Every day | ORAL | Status: DC
  Administered 2021-05-12 – 2021-05-18 (×7): 10 mg via ORAL
  Filled 2021-05-11 (×7): qty 1

## 2021-05-11 MED ORDER — FUROSEMIDE 20 MG PO TABS
20.0000 mg | ORAL_TABLET | Freq: Two times a day (BID) | ORAL | Status: DC
  Administered 2021-05-12 – 2021-05-18 (×13): 20 mg via ORAL
  Filled 2021-05-11 (×14): qty 1

## 2021-05-11 NOTE — TOC Progression Note (Addendum)
Transition of Care [facility]) - Progression Note    Patient Details  Name: [name] MRN: [id number] Date of Birth: 01-Jul-1955  [facility] CM/SW Contact  Beverly Sessions, RN Phone Number: 05/11/2021, 2:17 PM  Clinical Narrative:     Notified by MD that Peer to Peer has been approved Per Otila Kluver at [facility] they have not received notification as of yet MD to order rapid covid    Update:  Per Otila Kluver they have requested for [employer] to consider a carve out for the wound vac and air mattress  Expected Discharge Plan: [place] Barriers to Discharge: Continued Medical Work up  Expected Discharge Plan and Services Expected Discharge Plan: [place] arrangements for the past 2 months: Single Family Home                                       Social Determinants of Health (SDOH) Interventions    Readmission Risk Interventions No flowsheet data found.

## 2021-05-11 NOTE — Progress Notes (Addendum)
PROGRESS NOTE    [name]  UVO:[id number] DOB: [birth year] DOA: 04/16/2021 PCP: Juline Patch, MD    Brief Narrative:  unfortunate 65 year old female with extensive past medical history who was brought in via EMS for evaluation of bilateral lower extremity redness swelling and pain.  Apparently patient has been in her wheelchair at home unable to move for several days.  Lower extremity significant cellulitic changes.  On broad-spectrum antibiotics.  Also some question of fracture in the feet on the right lower extremity.   Podiatry consulted on admission,Concern regarding vascular supply lower extremities.  Podiatry apparently reached out to vascular surgery for consultation prior to any surgical intervention.  Awaiting vascular evaluation to assess lower extremity vasculopathy. WOC following. ABI completed with results significant for severe bilateral peripheral arterial disease.    Angiogram delayed several times due to patient refusal and concerned about capacity.  Angiogram completed on 11/1.  Successful recanalization right lower extremity for limb salvage.  Podiatry is deferring surgical intervention until we have angiography and revascularization attempt on both lower extremities.  Now the right is been completed the patient is apparently refusing left lower extremity angiogram.  Vascular surgery is offered general anesthesia but at this time the patient continues to refuse.  11/4: Patient agreed to angiography this morning.  Notify vascular surgery 11/5: Status post angiography.  Appreciate podiatry follow-up.  Current plan for surgical intervention with Dr. Luana Shu on 11/7 11/7: N.p.o. for debridement with podiatry today.  Patient aware of surgical plan and in agreement.  N.p.o. 11/8: Postop day 1 status post extensive debridement and wound VAC placement 11/11: podiatry has placed wound care and f/u recs. Stable for discharge, awaiting snf insurance authorization     Assessment  & Plan:  Sepsis Severe bilateral lower extremity cellulitis, nonpurulent Sepsis criteria met with tachycardia and leukocytosis.  Presumed source bilateral lower extremity nonpurulent cellulitis Concern regarding vascular supply Podiatry and vascular consulted MRSA screen negative Treated with vanc/cefepime then unasyn, iv abx stopped 11/8 -Angiogram with successful recanalization right lower extremity for limb salvage on 11/1 -Angiogram of left lower extremity successful on 11/4 -Status post extensive debridement and wound VAC placement with podiatry on 11/7 Plan: Postoperative wound care Continue wound vac Clean margins, podiatry advises 5 days post-op abx, has now completed 5 day course of keflex/doxy Podiatry advises: - finish abx - 3x weekly dry dressing change to right foot - 3x weekly wound vac changes left heel - 3x weekly xeroform dressings to lower extremity wounds - f/u wound center (patient is established there) - f/u podiatry 2 weeks dr. Vickki Muff - nonweightbearing left heel, weightbearing right heel as needed for transfer  Discharge planning Medically stable for discharge. On 11/14 TOC notified insurance declined snf. I called on 11/14 to arrange peer-to-peer (see TOC note from 11/14). I heard back from [place] md dr. Jerene Pitch and after p2p she has approved rehab. Will relay this info to [facility]  [facility] Patient reports several days vertigo provoked by head movement relieved w/ rest, no tinnitus or ha or hearing loss. Vestibular pt worked w/ patient on 11/14, pt thinks may have helped a little.    Acute kidney injury on chronic kidney disease stage IIIb Aki resolved, cr stable at ~1.3 - monitor   Hypothyroidism Continue Synthroid   Type 2 diabetes mellitus with peripheral neuropathy Oral medications on hold BG overall somewhat elevated Continue RISS Lantus 8 qhs Continue Neurontin   Essential hypertension BP elevated Continue Toprol, clonidine, amlodipine Re-started  arb as  kidney function has returned to baseline. have increased irbesartan to 150 - resumed home lasix on 11/14. Patient requests reduced dose from 40 bid to 20 bid, will trial that today. - will also increase amlodipine from 5 to 10  Morbid obesity BMI 56.38 This complicates overall care and prognosis Nutrition consulted   Poor self-care TOC consulted DSS is following   DVT prophylaxis: SQ Lovenox Code Status: Full Family Communication: patient requests no calls Disposition Plan: Status is: Inpatient  Remains inpatient appropriate because: unsafe d/c plan       Level of care: Med-Surg  Consultants:  Vascular surgery Podiatry  Procedures:  Right lower extremity angiogram 11/1 Left lower extremity angiogram 11/4 Surgical debridement 11/7  Antimicrobials: See above   Subjective: Patient seen and examined.  No pain. Tolerating diet. Vertigo with head movement to side somewhat improved from yesterday  Objective: Vitals:   05/10/21 1517 05/10/21 1921 05/11/21 0604 05/11/21 0806  BP: (!) 155/61 (!) 172/60 (!) 173/66 (!) 169/56  Pulse: 66 (!) 52 (!) 59 (!) 57  Resp: $Remo'16 18 16   'vuBix$ Temp: (!) 96.6 F (35.9 C) (!) 97.3 F (36.3 C) 97.8 F (36.6 C) 98.1 F (36.7 C)  TempSrc: Oral Oral Oral Oral  SpO2: 100% 96% 98% 97%  Weight:      Height:        Intake/Output Summary (Last 24 hours) at 05/11/2021 1216 Last data filed at 05/11/2021 0900 Gross per 24 hour  Intake 600 ml  Output 1200 ml  Net -600 ml   Filed Weights   04/16/21 2244 04/17/21 0403 05/03/21 1640  Weight: 122.5 kg 118.5 kg 118.5 kg    Examination:  General exam: Mild distress due to pain Respiratory system: No adventitious sounds.  Normal work of breathing.  Room air Cardiovascular system: S1-S2, RRR, no murmurs Gastrointestinal system: Obese, NT/ND, normal bowel sounds Central nervous system: Alert and oriented. No focal neurological deficits. Extremities: Decreased power symmetrically.   Bilateral lower extremities in surgical wraps.  Not removed.    From 11/9:    Psychiatry: Judgement and insight appear normal. Mood & affect appropriate.     Data Reviewed: I have personally reviewed following labs and imaging studies  CBC: Recent Labs  Lab 05/06/21 0624 05/10/21 0655  WBC 6.6 4.4  HGB 8.6* 9.6*  HCT 27.8* 31.1*  MCV 98.9 96.3  PLT 261 937   Basic Metabolic Panel: Recent Labs  Lab 05/06/21 0624 05/07/21 0418 05/10/21 0655 05/11/21 0656  NA 138 139 137 138  K 4.2 4.2 4.0 3.9  CL 113* 115* 109 107  CO2 20* 20* 20* 23  GLUCOSE 165* 125* 125* 103*  BUN $Re'15 17 14 11  'iyQ$ CREATININE 1.32* 1.27* 1.19* 1.10*  CALCIUM 8.1* 8.0* 8.2* 8.3*   GFR: Estimated Creatinine Clearance: 63.9 mL/min (A) (by C-G formula based on SCr of 1.1 mg/dL (H)). Liver Function Tests: No results for input(s): AST, ALT, ALKPHOS, BILITOT, PROT, ALBUMIN in the last 168 hours. No results for input(s): LIPASE, AMYLASE in the last 168 hours. No results for input(s): AMMONIA in the last 168 hours. Coagulation Profile: No results for input(s): INR, PROTIME in the last 168 hours.  Cardiac Enzymes: No results for input(s): CKTOTAL, CKMB, CKMBINDEX, TROPONINI in the last 168 hours. BNP (last 3 results) No results for input(s): PROBNP in the last 8760 hours. HbA1C: No results for input(s): HGBA1C in the last 72 hours. CBG: Recent Labs  Lab 05/10/21 1150 05/10/21 1619 05/10/21 2057 05/11/21 0805 05/11/21  1139  GLUCAP 155* 90 103* 100* 120*   Lipid Profile: No results for input(s): CHOL, HDL, LDLCALC, TRIG, CHOLHDL, LDLDIRECT in the last 72 hours. Thyroid Function Tests: No results for input(s): TSH, T4TOTAL, FREET4, T3FREE, THYROIDAB in the last 72 hours. Anemia Panel: No results for input(s): VITAMINB12, FOLATE, FERRITIN, TIBC, IRON, RETICCTPCT in the last 72 hours. Sepsis Labs: No results for input(s): PROCALCITON, LATICACIDVEN in the last 168 hours.  No results found for this  or any previous visit (from the past 240 hour(s)).        Radiology Studies: No results found.      Scheduled Meds:  acidophilus  2 capsule Oral Daily   amLODipine  5 mg Oral Daily   atorvastatin  40 mg Oral Daily   cloNIDine  0.3 mg Oral TID   clopidogrel  75 mg Oral Q breakfast   enoxaparin (LOVENOX) injection  0.5 mg/kg Subcutaneous Q24H   furosemide  40 mg Oral BID   insulin aspart  0-15 Units Subcutaneous TID WC   insulin aspart  0-5 Units Subcutaneous QHS   insulin glargine-yfgn  8 Units Subcutaneous QHS   irbesartan  150 mg Oral Daily   levothyroxine  112 mcg Oral Q0600   metoprolol succinate  50 mg Oral Q breakfast   sodium chloride flush  3 mL Intravenous Q12H   Continuous Infusions:     LOS: 24 days    Time spent: 20 minutes    Desma Maxim, MD [facility]   If 7PM-7AM, please contact night-coverage  05/11/2021, 12:16 PM

## 2021-05-11 NOTE — Care Management Important Message (Signed)
Important Message  Patient Details  Name: [name] MRN: [id number] Date of Birth: 1956/04/17   Medicare Important Message Given:  Yes     Dannette Barbara 05/11/2021, 2:17 PM

## 2021-05-11 NOTE — Progress Notes (Signed)
Occupational Therapy Treatment Patient Details Name: [name] MRN: [id number] DOB: Apr 17, 1956 Today's Date: 05/11/2021   History of present illness 65 y.o. female with a past medical history of DM, HTN, hypothyroidism, kidney stones, lymphedema, morbid obesity and plantar fasciitis followed by podiatry outpatient for some chronic wounds and edema in the lower extremity who presents via EMS after a welfare check was made on the patient with neighbors concerned that patient has been stuck in her recliner for several days.  Patient states her feet have been hurting her for over a month got particularly bad over the last couple of days preventing her from standing or walking. Pt underwent R angioplasty and stent placement on 04/27/21. On 05/03/21, pt underwent right posterior lower leg ulceration wound debridement, left lower leg ulceration wound debridement, left heel wound debridement, right partial hallux amputation, and right partial fifth ray amputation.   OT comments  [name] seen for OT treatment on this date. Upon arrival to room pt awake/alert. Pt in bed and agreeable to tx. Pt instructed and return demonstrated  ther-ex for BUE and PLB. Pt MAX A don/doff shoes sitting, EOB. Tolerated ~ 15 min dynamic sitting. Pt making good progress toward goals. Pt continues to benefit from skilled OT services to maximize return to PLOF and minimize risk of future falls, injury, caregiver burden, and readmission. Will continue to follow POC. Discharge recommendation remains appropriate.     Recommendations for follow up therapy are one component of a multi-disciplinary discharge planning process, led by the attending physician.  Recommendations may be updated based on patient status, additional functional criteria and insurance authorization.    Follow Up Recommendations  Skilled nursing-short term rehab (<3 hours/day)    Assistance Recommended at Discharge Frequent or constant Supervision/Assistance   Equipment Recommendations  Other (comment) (defer to next venue of care)    Recommendations for Other Services      Precautions / Restrictions Precautions Precautions: Fall Restrictions Weight Bearing Restrictions: Yes RLE Weight Bearing: Partial weight bearing RLE Partial Weight Bearing Percentage or Pounds: PWB to RLE w/ post op shoe, WB on heel only LLE Weight Bearing: Non weight bearing LLE Partial Weight Bearing Percentage or Pounds: Toe touch WB to forefoot only, no heel WB, for tranfers only w/ post op shoe donned       Mobility Bed Mobility Overal bed mobility: Needs Assistance Bed Mobility: Rolling;Supine to Sit;Sit to Supine Rolling: Max assist;+2 for physical assistance   Supine to sit: Mod assist Sit to supine: Max assist   General bed mobility comments: + 2 to slide pt up to top of bed    Transfers                         Balance Overall balance assessment: Needs assistance Sitting-balance support: No upper extremity supported;Feet supported Sitting balance-Leahy Scale: Good                                     ADL either performed or assessed with clinical judgement   ADL Overall ADL's : Needs assistance/impaired                                       General ADL Comments: Pt MAX A don/doff shoes sitting, EOB. Seated, EOB ~ 15 min for simulated  seated grooming tasks.     Extremity/Trunk Assessment               Cognition Arousal/Alertness: Awake/alert Behavior During Therapy: WFL for tasks assessed/performed Overall Cognitive Status: Within Functional Limits for tasks assessed                                            Exercises Exercises: Other exercises Other Exercises Other Exercises: Pt educ re: OT role, importance of mvmt for to maintain functional mobility, WB precautions, ther-ex, PLB Other Exercises: rolling to assess dizziness, sup<>sit, ther- ex Other Exercises: Ther -ex  chest press x 10, cross body jabs x 10 BUE, shoulder adbuction/ adduction x 10 BUE   Shoulder Instructions       General Comments      Pertinent Vitals/ Pain       Pain Assessment: 0-10 Faces Pain Scale: Hurts little more Pain Location: L knee Pain Descriptors / Indicators: Aching Pain Intervention(s): Limited activity within patient's tolerance;Monitored during session;Repositioned  Home Living                                          Prior Functioning/Environment              Frequency  Min 1X/week        Progress Toward Goals  OT Goals(current goals can now be found in the care plan section)  Progress towards OT goals: Progressing toward goals  Acute Rehab OT Goals Patient Stated Goal: to get better OT Goal Formulation: With patient Time For Goal Achievement: 05/18/21 Potential to Achieve Goals: Fair ADL Goals Pt Will Perform Upper Body Dressing: with set-up;sitting Pt Will Perform Lower Body Dressing: with mod assist;sitting/lateral leans Pt Will Transfer to Toilet: with mod assist;with +2 assist;stand pivot transfer;bedside commode  Plan Discharge plan remains appropriate;Frequency remains appropriate    Co-evaluation                 AM-PAC OT "6 Clicks" Daily Activity     Outcome Measure   Help from another person eating meals?: None Help from another person taking care of personal grooming?: A Little Help from another person toileting, which includes using toliet, bedpan, or urinal?: Total Help from another person bathing (including washing, rinsing, drying)?: A Lot Help from another person to put on and taking off regular upper body clothing?: A Little Help from another person to put on and taking off regular lower body clothing?: A Lot 6 Click Score: 15    End of Session    OT Visit Diagnosis: Unsteadiness on feet (R26.81);Muscle weakness (generalized) (M62.81);Pain Pain - Right/Left: Left Pain - part of body: Knee    Activity Tolerance Patient tolerated treatment well   Patient Left in bed;with call bell/phone within reach   Nurse Communication          Time: 8416-6063 OT Time Calculation (min): 28 min  Charges: OT General Charges $OT Visit: 1 Visit OT Treatments $Self Care/Home Management : 8-22 mins $Therapeutic Exercise: 8-22 mins  Nino Glow, OTS   Nino Glow 05/11/2021, 2:38 PM

## 2021-05-11 NOTE — Progress Notes (Signed)
PT Cancellation Note  Patient Details Name: [name] MRN: [id number] DOB: February 12, 1956   Cancelled Treatment:    Reason Eval/Treat Not Completed: Other (comment). Pt tearful throughout attempt, upset about wanting water/asking for water. When educated on PT plan for session, started to cry and stated she just wasn't up to it today, that it made her dizzy and nauseous. Pt encouraged as able, water provided, and agreeable to PT attempt tomorrow.   Lieutenant Diego PT, DPT 3:24 PM,05/11/21

## 2021-05-11 NOTE — Progress Notes (Signed)
Patient refusing to take Lasix, "I want to hold off on my lasix altogether today, I have been peeing all night and I am tired."  Dr. Si Raider made aware.

## 2021-05-12 DIAGNOSIS — N179 Acute kidney failure, unspecified: Secondary | ICD-10-CM | POA: Diagnosis not present

## 2021-05-12 DIAGNOSIS — L03119 Cellulitis of unspecified part of limb: Secondary | ICD-10-CM | POA: Diagnosis not present

## 2021-05-12 LAB — BASIC METABOLIC PANEL
Anion gap: 6 (ref 5–15)
BUN: 12 mg/dL (ref 8–23)
CO2: 24 mmol/L (ref 22–32)
Calcium: 7.9 mg/dL — ABNORMAL LOW (ref 8.9–10.3)
Chloride: 107 mmol/L (ref 98–111)
Creatinine, Ser: 1.11 mg/dL — ABNORMAL HIGH (ref 0.44–1.00)
GFR, Estimated: 54 mL/min — ABNORMAL LOW (ref >60)
Glucose, Bld: 89 mg/dL (ref 70–99)
Potassium: 3.5 mmol/L (ref 3.5–5.1)
Sodium: 137 mmol/L (ref 135–145)

## 2021-05-12 LAB — GLUCOSE, CAPILLARY
Glucose-Capillary: 76 mg/dL (ref 70–99)
Glucose-Capillary: 118 mg/dL — ABNORMAL HIGH (ref 70–99)
Glucose-Capillary: 86 mg/dL (ref 70–99)
Glucose-Capillary: 127 mg/dL — ABNORMAL HIGH (ref 70–99)

## 2021-05-12 NOTE — Progress Notes (Addendum)
PROGRESS NOTE    As per Dr. Si Raider: Unfortunate 65 year old female with extensive past medical history who was brought in via EMS for evaluation of bilateral lower extremity redness swelling and pain.  Apparently patient has been in her wheelchair at home unable to move for several days.  Lower extremity significant cellulitic changes.  On broad-spectrum antibiotics.  Also some question of fracture in the feet on the right lower extremity.   Podiatry consulted on admission,Concern regarding vascular supply lower extremities.  Podiatry apparently reached out to vascular surgery for consultation prior to any surgical intervention.  Awaiting vascular evaluation to assess lower extremity vasculopathy. WOC following. ABI completed with results significant for severe bilateral peripheral arterial disease.    Angiogram delayed several times due to patient refusal and concerned about capacity.  Angiogram completed on 11/1.  Successful recanalization right lower extremity for limb salvage.  Podiatry is deferring surgical intervention until we have angiography and revascularization attempt on both lower extremities.  Now the right is been completed the patient is apparently refusing left lower extremity angiogram.  Vascular surgery is offered general anesthesia but at this time the patient continues to refuse.   11/4: Patient agreed to angiography this morning.  Notify vascular surgery 11/5: Status post angiography.  Appreciate podiatry follow-up.  Current plan for surgical intervention with Dr. Luana Shu on 11/7 11/7: N.p.o. for debridement with podiatry today.  Patient aware of surgical plan and in agreement.  N.p.o. 11/8: Postop day 1 status post extensive debridement and wound VAC placement 11/11: podiatry has placed wound care and f/u recs. Stable for discharge, awaiting snf insurance authorization  As per Dr. Jimmye Norman: Medically stable for d/c but waiting on SNF placement still    [name]   ZDG:[id number] DOB: [birth year] DOA: 04/16/2021 PCP: Juline Patch, MD  Assessment & Plan:   Principal Problem:   Adjustment disorder with anxiety Active Problems:   Sepsis due to cellulitis ([place])   Pressure injury of skin   Left leg pain  Sepsis: met criteria w/ tachycardia, leukocytosis & cellulitis  Severe bilateral lower extremity cellulitis:  nonpurulent. Completed abx course on 05/04/21. S/p angiogram with successful recanalization right lower extremity for limb salvage on 11/1. S/p angiogram of left lower extremity successful on 11/4. S/p extensive debridement and wound VAC placement with podiatry on 11/7. Continue w/ wound care. Will need  3x weekly dry dressing change to right foot, 3x weekly wound vac changes left heel, 3x weekly xeroform dressings to lower extremity wounds. D/u wound center (patient is established there). F/u podiatry 2 weeks Dr. Vickki Muff. Nonweightbearing left heel, weightbearing right heel as needed for transfer  BPPV: meclizine prn. Had vestibular exercises    AKI on CKDIIIb: Cr is labile. Avoid nephrotoxic meds    Hypothyroidism: continue on home dose of synthroid    DM2: continue on glargine, SSI w/ accuchecks   HTN: continue on clonidine, metoporolol, irbesartan, amlodipine & lasix    Morbid obesity: BMI 43.4. Complicates overall care & prognosis    Poor self-care: DSS is following    DVT prophylaxis: lovenox  Code Status: full  Family Communication: Disposition Plan: waiting on SNF placement  Level of care: Med-Surg  Status is: Inpatient  Remains inpatient appropriate because: medically stable for d/c. Waiting on SNF placement still      Consultants:  Podiatry   Procedures:   Antimicrobials:    Subjective: Pt c/o malaise   Objective: Vitals:   05/11/21 0806 05/11/21 1609 05/11/21 1955 05/12/21 0421  BP: (!) 169/56 (!) 166/65 (!) 138/46 (!) 175/67  Pulse: (!) 57 (!) 58 (!) 52 (!) 57  Resp:   20 16  Temp: 98.1 F (36.7 C)  98 F (36.7 C) 98.3 F (36.8 C) 97.9 F (36.6 C)  TempSrc: Oral Oral Oral Oral  SpO2: 97% 98% 99% 98%  Weight:      Height:        Intake/Output Summary (Last 24 hours) at 05/12/2021 0729 Last data filed at 05/11/2021 2100 Gross per 24 hour  Intake 660 ml  Output 30 ml  Net 630 ml   Filed Weights   04/16/21 2244 04/17/21 0403 05/03/21 1640  Weight: 122.5 kg 118.5 kg 118.5 kg    Examination:  General exam: Appears calm and comfortable  Respiratory system: Clear to auscultation. Respiratory effort normal. Cardiovascular system: S1 & S2 +. No rubs, gallops or clicks.  Gastrointestinal system: Abdomen is obese, soft and nontender. Hypoactive bowel sounds heard. Central nervous system: Alert and oriented. Moves all extremities  Psychiatry: Judgement and insight appear normal. Flat mood and affect    Data Reviewed: I have personally reviewed following labs and imaging studies  CBC: Recent Labs  Lab 05/06/21 0624 05/10/21 0655  WBC 6.6 4.4  HGB 8.6* 9.6*  HCT 27.8* 31.1*  MCV 98.9 96.3  PLT 261 741   Basic Metabolic Panel: Recent Labs  Lab 05/06/21 0624 05/07/21 0418 05/10/21 0655 05/11/21 0656 05/12/21 0608  NA 138 139 137 138 137  K 4.2 4.2 4.0 3.9 3.5  CL 113* 115* 109 107 107  CO2 20* 20* 20* 23 24  GLUCOSE 165* 125* 125* 103* 89  BUN $Re'15 17 14 11 12  'bem$ CREATININE 1.32* 1.27* 1.19* 1.10* 1.11*  CALCIUM 8.1* 8.0* 8.2* 8.3* 7.9*   GFR: Estimated Creatinine Clearance: 63.4 mL/min (A) (by C-G formula based on SCr of 1.11 mg/dL (H)). Liver Function Tests: No results for input(s): AST, ALT, ALKPHOS, BILITOT, PROT, ALBUMIN in the last 168 hours. No results for input(s): LIPASE, AMYLASE in the last 168 hours. No results for input(s): AMMONIA in the last 168 hours. Coagulation Profile: No results for input(s): INR, PROTIME in the last 168 hours. Cardiac Enzymes: No results for input(s): CKTOTAL, CKMB, CKMBINDEX, TROPONINI in the last 168 hours. BNP (last 3  results) No results for input(s): PROBNP in the last 8760 hours. HbA1C: No results for input(s): HGBA1C in the last 72 hours. CBG: Recent Labs  Lab 05/11/21 0805 05/11/21 1139 05/11/21 1608 05/11/21 2112 05/11/21 2326  GLUCAP 100* 120* 140* 73 143*   Lipid Profile: No results for input(s): CHOL, HDL, LDLCALC, TRIG, CHOLHDL, LDLDIRECT in the last 72 hours. Thyroid Function Tests: No results for input(s): TSH, T4TOTAL, FREET4, T3FREE, THYROIDAB in the last 72 hours. Anemia Panel: No results for input(s): VITAMINB12, FOLATE, FERRITIN, TIBC, IRON, RETICCTPCT in the last 72 hours. Sepsis Labs: No results for input(s): PROCALCITON, LATICACIDVEN in the last 168 hours.  Recent Results (from the past 240 hour(s))  Resp Panel by RT-PCR (Flu A&B, Covid) Nasopharyngeal Swab     Status: None   Collection Time: 05/11/21  1:19 PM   Specimen: Nasopharyngeal Swab; Nasopharyngeal(NP) swabs in vial transport medium  Result Value Ref Range Status   SARS Coronavirus 2 by RT PCR NEGATIVE NEGATIVE Final    Comment: (NOTE) SARS-CoV-2 target nucleic acids are NOT DETECTED.  The SARS-CoV-2 RNA is generally detectable in upper respiratory specimens during the acute phase of infection. The lowest concentration of SARS-CoV-2 viral copies  this assay can detect is 138 copies/mL. A negative result does not preclude SARS-Cov-2 infection and should not be used as the sole basis for treatment or other patient management decisions. A negative result may occur with  improper specimen collection/handling, submission of specimen other than nasopharyngeal swab, presence of viral mutation(s) within the areas targeted by this assay, and inadequate number of viral copies(<138 copies/mL). A negative result must be combined with clinical observations, patient history, and epidemiological information. The expected result is Negative.  Fact Sheet for Patients:  EntrepreneurPulse.com.au  Fact Sheet  for Healthcare Providers:  IncredibleEmployment.be  This test is no t yet approved or cleared by the [place] FDA and  has been authorized for detection and/or diagnosis of SARS-CoV-2 by FDA under an Emergency Use Authorization (EUA). This EUA will remain  in effect (meaning this test can be used) for the duration of the COVID-19 declaration under Section 564(b)(1) of the Act, 21 U.S.C.section 360bbb-3(b)(1), unless the authorization is terminated  or revoked sooner.       Influenza A by PCR NEGATIVE NEGATIVE Final   Influenza B by PCR NEGATIVE NEGATIVE Final    Comment: (NOTE) The Xpert Xpress SARS-CoV-2/FLU/RSV plus assay is intended as an aid in the diagnosis of influenza from Nasopharyngeal swab specimens and should not be used as a sole basis for treatment. Nasal washings and aspirates are unacceptable for Xpert Xpress SARS-CoV-2/FLU/RSV testing.  Fact Sheet for Patients: EntrepreneurPulse.com.au  Fact Sheet for Healthcare Providers: IncredibleEmployment.be  This test is not yet approved or cleared by the [place] FDA and has been authorized for detection and/or diagnosis of SARS-CoV-2 by FDA under an Emergency Use Authorization (EUA). This EUA will remain in effect (meaning this test can be used) for the duration of the COVID-19 declaration under Section 564(b)(1) of the Act, 21 U.S.C. section 360bbb-3(b)(1), unless the authorization is terminated or revoked.  Performed at [facility], [address] [place], [place] [address]          Radiology Studies: No results found.      Scheduled Meds:  acidophilus  2 capsule Oral Daily   amLODipine  10 mg Oral Daily   atorvastatin  40 mg Oral Daily   cloNIDine  0.3 mg Oral TID   clopidogrel  75 mg Oral Q breakfast   enoxaparin (LOVENOX) injection  0.5 mg/kg Subcutaneous Q24H   furosemide  20 mg Oral BID   insulin aspart  0-15 Units  Subcutaneous TID WC   insulin aspart  0-5 Units Subcutaneous QHS   insulin glargine-yfgn  8 Units Subcutaneous QHS   irbesartan  150 mg Oral Daily   levothyroxine  112 mcg Oral Q0600   metoprolol succinate  50 mg Oral Q breakfast   sodium chloride flush  3 mL Intravenous Q12H   Continuous Infusions:   LOS: 25 days    Time spent: 33 mins     Wyvonnia Dusky, MD [facility] Pager [phone]  If 7PM-7AM, please contact night-coverage 05/12/2021, 7:29 AM

## 2021-05-12 NOTE — Progress Notes (Signed)
Daily Progress Note   Subjective  - 9 Days Post-Op  Follow-up bilateral foot wounds.  Status post right fifth toe amputation as well as debridement of left heel.  Patient resting in bed comfortably today.  Objective Vitals:   05/11/21 1609 05/11/21 1955 05/12/21 0421 05/12/21 0739  BP: (!) 166/65 (!) 138/46 (!) 175/67 (!) 166/57  Pulse: (!) 58 (!) 52 (!) 57 (!) 54  Resp:  20 16   Temp: 98 F (36.7 C) 98.3 F (36.8 C) 97.9 F (36.6 C) (!) 97.5 F (36.4 C)  TempSrc: Oral Oral Oral Oral  SpO2: 98% 99% 98% 97%  Weight:      Height:        Physical Exam: Right hallux and partial fifth ray amputation sites appear to be well coapted with sutures intact, no dehiscence present today, capillary fill time appears to be intact to flaps of amputation sites.  Posterior right and left lower leg venous type ulcerations appear to be 100% granular today and do appear to be slightly improved in size overall.  No corresponding erythema, mild serous drainage.  Moderate lymphedema/venous insufficiency noted to bilateral lower extremities, improved overall since admission.  Left plantar heel ulceration measures 2.7 cm x 2.5 cm x 1.2 cm, wound bed appears to have areas of granulation tissue present but also appears to have some fibrous tissue as well as some necrotic tissue.  No bone exposed at this time but appears to be close to the surface of bone.  Muscle and plantar fascia is visualized.  Mild serous drainage, minimal odor, mild associated erythema.  Laboratory CBC    Component Value Date/Time   WBC 4.4 05/10/2021 0655   HGB 9.6 (L) 05/10/2021 0655   HGB 12.5 02/20/2012 0737   HCT 31.1 (L) 05/10/2021 0655   HCT 37.6 02/20/2012 0737   PLT 280 05/10/2021 0655   PLT 230 02/20/2012 0737    BMET    Component Value Date/Time   NA 137 05/12/2021 0608   NA 137 10/22/2020 1450   NA 139 02/20/2012 0737   K 3.5 05/12/2021 0608   K 4.7 02/20/2012 0737   CL 107 05/12/2021 0608   CL 103  02/20/2012 0737   CO2 24 05/12/2021 0608   CO2 27 02/20/2012 0737   GLUCOSE 89 05/12/2021 0608   GLUCOSE 123 (H) 02/20/2012 0737   BUN 12 05/12/2021 0608   BUN 27 10/22/2020 1450   BUN 26 (H) 02/20/2012 0737   CREATININE 1.11 (H) 05/12/2021 0608   CREATININE 1.31 (H) 02/20/2012 0737   CALCIUM 7.9 (L) 05/12/2021 0608   CALCIUM 9.3 02/20/2012 0737   GFRNONAA 54 (L) 05/12/2021 0608   GFRNONAA 45 (L) 02/20/2012 0737   GFRAA 39 (L) 04/30/2020 1408   GFRAA 52 (L) 02/20/2012 0737    Assessment/Planning: 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 status post left heel debridement for necrotic tissue and posterior lower leg wound debridements PVD  The pathology shows clean margins at the proximal aspect.   All wounds look to be stable.  Dressings changed today. 100% excisional wound debridement performed into muscle to the left heel.  Predebridement measurement same as postdebridement, 2.7 x 2.5 x 1.2 cm, removed fibrous tissue, slough, necrotic tissue to bleeding granular tissue with a 15 blade and tissue nipper.  Patient tolerated well.  Hemostasis achieved with compression. Patient will need 3 times a week wound VAC dressing on her left heel. Can perform dry dressing 3 times a  week to the right foot wounds. Bilateral leg wounds with mild drainage.  I would recommend 3 times a week Xeroform dressings to the lower leg wounds. Patient has been seen routinely by the wound care center.  Recommend follow-up for bilateral lower leg wounds as well as lymphedema. Follow-up with podiatry in an outpatient setting within the next 2 weeks. Patient will need to remain nonweightbearing to the left heel.  Can perform weightbearing on the right heel as needed for transfer. From podiatry standpoint patient is stable for discharge.  Caroline More, DPM  05/12/2021, 1:26 PM

## 2021-05-12 NOTE — Consult Note (Signed)
[facility] Nurse wound consult note Consultation was completed by review of records, images and assistance from the bedside nurse/clinical staff.   Reason for Consult: re-application of NPWT to the heel wound Had previously been assessed by [facility] nurse, non complicated NPWT dressing. [place] for bedside nurses to perform.  I reviewed new images flat wound with no packing agree with same Wound type: pressure injury; debridement at the bedside today  Pressure Injury POA: Yes See Dr. Deborra Medina note from today on debridement and measurements/description He also noted orders for her bilateral LE wound and her surgical wounds which I have updated in the computer per his notes.  Dressing procedure/placement/frequency:  See updated orders Xeroform to the LEs Dry dressings to the right foot wounds  NPWT to the heel wound; bedside nurse aware Prevalon boots in place to offload heels Follow up in wound care center for lymphedema and wound care  Follow up in podiatrist office as scheduled   Re consult if needed, will not follow at this time. Thanks  Bindi Klomp [employer], RN,CWOCN, CNS, [place] [phone])

## 2021-05-12 NOTE — Progress Notes (Addendum)
Dr. Luana Shu removed wound vac from left heel, bedside debridement of left heel done.  NS wet to dry dressing applied to left heel.  May leave wound vac off for now, may reapply when wound care RN is available.  BLE dressings changed, xeroform on open wounds on bilateral calf areas, and dry gauze on right foot incision sites, ace wrap applied bilaterally.  Tolerated well by patient.  Denied need for pain medicine.  Needs addressed.

## 2021-05-12 NOTE — TOC Progression Note (Addendum)
Transition of Care [facility]) - Progression Note    Patient Details  Name: [name] MRN: [id number] Date of Birth: 1956-06-11  Transition of [facility]) CM/SW [place], LCSW Phone Number: 05/12/2021, 10:22 AM  Clinical Narrative:  [facility] out decision still pending. If denied, SNF admissions coordinator said administrator will let her know what to do.   12:54 pm: [facility] has rescinded their bed offer because [facility] will not cover air mattress or wound vac. Left message for [facility] admissions coordinator asking her to review referral again.  Expected Discharge Plan: [place] Barriers to Discharge: Continued Medical Work up  Expected Discharge Plan and Services Expected Discharge Plan: [place] arrangements for the past 2 months: Single Family Home                                       Social Determinants of Health (SDOH) Interventions    Readmission Risk Interventions No flowsheet data found.

## 2021-05-12 NOTE — Plan of Care (Signed)

## 2021-05-12 NOTE — Progress Notes (Signed)
Physical Therapy Treatment Patient Details Name: [name] MRN: [id number] DOB: Feb 24, 1956 Today's Date: 05/12/2021   History of Present Illness 65 y.o. female with a past medical history of DM, HTN, hypothyroidism, kidney stones, lymphedema, morbid obesity and plantar fasciitis followed by podiatry outpatient for some chronic wounds and edema in the lower extremity who presents via EMS after a welfare check was made on the patient with neighbors concerned that patient has been stuck in her recliner for several days.  Patient states her feet have been hurting her for over a month got particularly bad over the last couple of days preventing her from standing or walking. Pt underwent R angioplasty and stent placement on 04/27/21. On 05/03/21, pt underwent right posterior lower leg ulceration wound debridement, left lower leg ulceration wound debridement, left heel wound debridement, right partial hallux amputation, and right partial fifth ray amputation.    PT Comments    Patient alert, agreeable to PT with encouragement and education. Willing to attempt canalith repositioning technique to attempt to address dizziness. The patient was able to sit up in bed in long sitting with minA, rolling to R performed twice maxAx2 for safety/physical assist. R dix hallpike + for nystagmus, epley x2 with pt reported improved symptoms in initial testing position and sitting EOB at end of session. Education provided on positioning during the day and to assess her symptoms with next attempts at bed mobility. The patient remains motivated to address these symptoms and return to PLOF as able.   Subjective history of current problem: Reported that the dizziness onset was sudden after her procedure here at the hospital.  exacerbating factors: Looking L and R, especially rolling in bed to the R  Relieving factors: rest, eyes closed  Symptom duration: <1 minute  Symptom frequency: every time she rolls in bed  especially to the R  Symptom onset (sudden, slow): sudden  Description of symptoms: pt reports feeling "dizzy" and that the room feels like it is moving, that shes going to fall out of bed   OCULOMOTOR / VESTIBULAR TESTING:  Oculomotor Exam- Room Light  Findings Comments  Ocular Alignment normal   Ocular ROM normal   Spontaneous Nystagmus normal   Gaze-Holding Nystagmus not examined   End-Gaze Nystagmus not examined   Vergence (normal 2-3") not examined   Smooth Pursuit normal   Saccades not examined      BPPV TESTS:  Symptoms Duration Intensity   Left Dix-Hallpike  Not tested due to previous sessions, complaints of severity to R     Right Dix-Hallpike  + for nystagmus, reported dizziness <10 seconds 4/10, second attempt 2/10   Left Head Roll Negative for nystagmus, pt reported some dizziness <5 seconds 2/10   Right Head Roll Unclear; pt reports feeling dizzy, hard to decipher true nystagmus  <10 seconds 3/10                       Recommendations for follow up therapy are one component of a multi-disciplinary discharge planning process, led by the attending physician.  Recommendations may be updated based on patient status, additional functional criteria and insurance authorization.  Follow Up Recommendations  Skilled nursing-short term rehab (<3 hours/day)     Assistance Recommended at Discharge Frequent or constant Supervision/Assistance  Equipment Recommendations  Other (comment) (defer to post acute)    Recommendations for Other Services       Precautions / Restrictions Precautions Precautions: Fall Restrictions Weight Bearing Restrictions: Yes RLE Weight  Bearing: Partial weight bearing RLE Partial Weight Bearing Percentage or Pounds: PWB to RLE w/ post op shoe, WB on heel only LLE Weight Bearing: Weight bearing as tolerated LLE Partial Weight Bearing Percentage or Pounds: toe touch WB to forefoot only, no heel WB, for transfers only w/post op shoe Other  Position/Activity Restrictions: right heel transfer only, left toe touch/ L heel NWB     Mobility  Bed Mobility Overal bed mobility: Needs Assistance Bed Mobility: Rolling;Supine to Sit;Sit to Supine Rolling: Max assist;+2 for physical assistance   Supine to sit: Mod assist;+2 for safety/equipment Sit to supine: Mod assist;+2 for physical assistance   General bed mobility comments: + 2 to slide pt up to top of bed    Transfers                   General transfer comment: NT, session focused on bed mobility and Epley manuevers    Ambulation/Gait                   Stairs             Wheelchair Mobility    Modified Rankin (Stroke Patients Only)       Balance Overall balance assessment: Needs assistance Sitting-balance support: No upper extremity supported;Feet supported Sitting balance-Leahy Scale: Good Sitting balance - Comments: Requires min assist when sitting on the EOB initially, progressed to CGA                                    Cognition Arousal/Alertness: Awake/alert Behavior During Therapy: WFL for tasks assessed/performed Overall Cognitive Status: Within Functional Limits for tasks assessed                                          Exercises Other Exercises Other Exercises: dix hall pike, epley x2 on the R, supine head roll test several times to assess    General Comments        Pertinent Vitals/Pain Pain Assessment: Faces Faces Pain Scale: Hurts little more Pain Location: main complaint of dizziness this session, some LE pain Pain Descriptors / Indicators: Aching Pain Intervention(s): Limited activity within patient's tolerance;Monitored during session;Repositioned    Home Living                          Prior Function            PT Goals (current goals can now be found in the care plan section) Progress towards PT goals: Progressing toward goals    Frequency    Min  2X/week      PT Plan Current plan remains appropriate    Co-evaluation              AM-PAC PT "6 Clicks" Mobility   Outcome Measure  Help needed turning from your back to your side while in a flat bed without using bedrails?: A Lot Help needed moving from lying on your back to sitting on the side of a flat bed without using bedrails?: A Lot Help needed moving to and from a bed to a chair (including a wheelchair)?: Total Help needed standing up from a chair using your arms (e.g., wheelchair or bedside chair)?: Total Help needed to walk in hospital room?: Total Help needed climbing  3-5 steps with a railing? : Total 6 Click Score: 8    End of Session   Activity Tolerance: Patient tolerated treatment well Patient left: in bed;with call bell/phone within reach;with bed alarm set Nurse Communication: Mobility status PT Visit Diagnosis: Muscle weakness (generalized) (M62.81);Difficulty in walking, not elsewhere classified (R26.2);Pain Pain - Right/Left:  (bilateral feet) Pain - part of body: Leg     Time: 1047-1110 PT Time Calculation (min) (ACUTE ONLY): 23 min  Charges:  $Therapeutic Activity: 8-22 mins $Canalith Rep Proc: 8-22 mins                     Lieutenant Diego PT, DPT 11:47 AM,05/12/21

## 2021-05-13 DIAGNOSIS — F4322 Adjustment disorder with anxiety: Secondary | ICD-10-CM | POA: Diagnosis not present

## 2021-05-13 DIAGNOSIS — L03119 Cellulitis of unspecified part of limb: Secondary | ICD-10-CM | POA: Diagnosis not present

## 2021-05-13 LAB — BASIC METABOLIC PANEL
Anion gap: 11 (ref 5–15)
BUN: 11 mg/dL (ref 8–23)
CO2: 25 mmol/L (ref 22–32)
Calcium: 7.9 mg/dL — ABNORMAL LOW (ref 8.9–10.3)
Chloride: 102 mmol/L (ref 98–111)
Creatinine, Ser: 1.1 mg/dL — ABNORMAL HIGH (ref 0.44–1.00)
GFR, Estimated: 55 mL/min — ABNORMAL LOW (ref >60)
Glucose, Bld: 114 mg/dL — ABNORMAL HIGH (ref 70–99)
Potassium: 3.3 mmol/L — ABNORMAL LOW (ref 3.5–5.1)
Sodium: 138 mmol/L (ref 135–145)

## 2021-05-13 LAB — CBC
HCT: 33.8 % — ABNORMAL LOW (ref 36.0–46.0)
Hemoglobin: 10.4 g/dL — ABNORMAL LOW (ref 12.0–15.0)
MCH: 29.3 pg (ref 26.0–34.0)
MCHC: 30.8 g/dL (ref 30.0–36.0)
MCV: 95.2 fL (ref 80.0–100.0)
Platelets: 282 10*3/uL (ref 150–400)
RBC: 3.55 MIL/uL — ABNORMAL LOW (ref 3.87–5.11)
RDW: 15.8 % — ABNORMAL HIGH (ref 11.5–15.5)
WBC: 5.1 10*3/uL (ref 4.0–10.5)
nRBC: 0 % (ref 0.0–0.2)

## 2021-05-13 LAB — GLUCOSE, CAPILLARY
Glucose-Capillary: 119 mg/dL — ABNORMAL HIGH (ref 70–99)
Glucose-Capillary: 167 mg/dL — ABNORMAL HIGH (ref 70–99)
Glucose-Capillary: 100 mg/dL — ABNORMAL HIGH (ref 70–99)
Glucose-Capillary: 113 mg/dL — ABNORMAL HIGH (ref 70–99)

## 2021-05-13 MED ORDER — POTASSIUM CHLORIDE CRYS ER 20 MEQ PO TBCR
40.0000 meq | EXTENDED_RELEASE_TABLET | Freq: Once | ORAL | Status: AC
  Administered 2021-05-13: 21:00:00 40 meq via ORAL
  Filled 2021-05-13: qty 2

## 2021-05-13 NOTE — Progress Notes (Addendum)
Physical Therapy Treatment Patient Details Name: [name] MRN: [id number] DOB: 1955-09-06 Today's Date: 05/13/2021   History of Present Illness 65 y.o. female with a past medical history of DM, HTN, hypothyroidism, kidney stones, lymphedema, morbid obesity and plantar fasciitis followed by podiatry outpatient for some chronic wounds and edema in the lower extremity who presents via EMS after a welfare check was made on the patient with neighbors concerned that patient has been stuck in her recliner for several days.  Patient states her feet have been hurting her for over a month got particularly bad over the last couple of days preventing her from standing or walking. Pt underwent R angioplasty and stent placement on 04/27/21. On 05/03/21, pt underwent right posterior lower leg ulceration wound debridement, left lower leg ulceration wound debridement, left heel wound debridement, right partial hallux amputation, and right partial fifth ray amputation.    PT Comments    Patient alert, agreeable to PT, reported some dizziness with mobility, and some knee pain (L knee) as well. The patient demonstrated improvement in bed mobility today and willingness to participate. Supine to sit with modA and bed rails, extra needed. Fair sitting balance throughout. The pt also displayed excellent recall of precautions. Session focused on promoting lateral scoot transfers and education due to difficulty weight bearing orders and pt's lack of traction with post op shoes. She was able to scoot L laterally several times, mod-maxA with attempts to block feet. Returned to supine modaX2 for safety. The patient would benefit from further skilled PT intervention to continue to progress towards goals. Recommendation remains appropriate.      Recommendations for follow up therapy are one component of a multi-disciplinary discharge planning process, led by the attending physician.  Recommendations may be updated based on  patient status, additional functional criteria and insurance authorization.  Follow Up Recommendations  Skilled nursing-short term rehab (<3 hours/day)     Assistance Recommended at Discharge Frequent or constant Supervision/Assistance  Equipment Recommendations  Other (comment) (defer to post acute)    Recommendations for Other Services       Precautions / Restrictions Precautions Precautions: Fall Restrictions Weight Bearing Restrictions: Yes RLE Weight Bearing: Partial weight bearing RLE Partial Weight Bearing Percentage or Pounds: PWB to RLE w/ post op shoe, WB on heel only LLE Weight Bearing: Partial weight bearing LLE Partial Weight Bearing Percentage or Pounds: toe touch WB to forefoot only, no heel WB, for transfers only w/post op shoe     Mobility  Bed Mobility Overal bed mobility: Needs Assistance Bed Mobility: Rolling;Supine to Sit;Sit to Supine Rolling: Mod assist;+2 for safety/equipment   Supine to sit: Mod assist Sit to supine: Mod assist;+2 for safety/equipment   General bed mobility comments: + 2 to slide pt up to top of bed    Transfers Overall transfer level: Needs assistance Equipment used: None Transfers: Bed to chair/wheelchair/BSC            Lateral/Scoot Transfers: Max assist General transfer comment: feet blocked due to pt's feet slipping, maxA to assist with weight shift    Ambulation/Gait                   Stairs             Wheelchair Mobility    Modified Rankin (Stroke Patients Only)       Balance Overall balance assessment: Needs assistance Sitting-balance support: Feet supported Sitting balance-Leahy Scale: Good Sitting balance - Comments: CGA  Cognition Arousal/Alertness: Awake/alert Behavior During Therapy: WFL for tasks assessed/performed Overall Cognitive Status: Within Functional Limits for tasks assessed                                  General Comments: oriented to place, date, situation; becomes agitated with attempts at movement        Exercises      General Comments        Pertinent Vitals/Pain Pain Assessment: Faces Faces Pain Scale: Hurts little more Pain Location: knee pain Pain Descriptors / Indicators: Aching Pain Intervention(s): Limited activity within patient's tolerance;Monitored during session;Repositioned    Home Living                          Prior Function            PT Goals (current goals can now be found in the care plan section) Progress towards PT goals: Progressing toward goals    Frequency    Min 2X/week      PT Plan Current plan remains appropriate    Co-evaluation              AM-PAC PT "6 Clicks" Mobility   Outcome Measure  Help needed turning from your back to your side while in a flat bed without using bedrails?: A Lot Help needed moving from lying on your back to sitting on the side of a flat bed without using bedrails?: A Lot Help needed moving to and from a bed to a chair (including a wheelchair)?: Total Help needed standing up from a chair using your arms (e.g., wheelchair or bedside chair)?: Total Help needed to walk in hospital room?: Total Help needed climbing 3-5 steps with a railing? : Total 6 Click Score: 8    End of Session   Activity Tolerance: Patient tolerated treatment well Patient left: in bed;with call bell/phone within reach;with bed alarm set Nurse Communication: Mobility status PT Visit Diagnosis: Muscle weakness (generalized) (M62.81);Difficulty in walking, not elsewhere classified (R26.2);Pain Pain - Right/Left:  (bilateral feet) Pain - part of body: Leg     Time: 1561-5379 PT Time Calculation (min) (ACUTE ONLY): 25 min  Charges:  $Therapeutic Activity: 23-37 mins                     Lieutenant Diego PT, DPT 3:25 PM,05/13/21

## 2021-05-13 NOTE — TOC Progression Note (Addendum)
Transition of Care [facility]) - Progression Note    Patient Details  Name: [name] MRN: [id number] Date of Birth: December 27, 1955  Transition [facility] Care [facility]) CM/SW [place], LCSW Phone Number: 05/13/2021, 8:53 AM  Clinical Narrative:   [facility] supervisor has not received response from [facility] MD in regards to covering wound vac and air mattress for [employer].  12:40 pm: [facility] is able to offer a bed and is aware of issues with carveout for wound vac and air mattress. Asked admissions coordinator to start insurance authorization. Patient is aware and agreeable.  Expected Discharge Plan: [place] Barriers to Discharge: Continued Medical Work up  Expected Discharge Plan and Services Expected Discharge Plan: [place] arrangements for the past 2 months: Single Family Home                                       Social Determinants of Health (SDOH) Interventions    Readmission Risk Interventions No flowsheet data found.

## 2021-05-13 NOTE — Progress Notes (Signed)
[facility]  PROGRESS NOTE  [name] HWE:[id number] DOB: 1955/10/08 DOA: 04/16/2021 PCP: Juline Patch, MD   Brief HPI:   65 year old female with extensive past medical history was brought by EMS for evaluation of bilateral lower extremity redness, swelling and pain.  Patient has been in her wheelchair at home and was unable to move for several days.  He was found to have lower extremity cellulitis.  Podiatry was consulted.  Vascular surgery was consulted prior to surgical intervention ABIs completed showed severe bilateral peripheral artery disease. Angiogram delayed several times due to patient refusal and concerned about capacity.  Angiogram completed on 11/1.  Successful recanalization right lower extremity for limb salvage.  Podiatry is deferring surgical intervention until we have angiography and revascularization attempt on both lower extremities.  Now the right is been completed the patient is apparently refusing left lower extremity angiogram.  Vascular surgery is offered general anesthesia but at this time the patient continues to refuse.   11/4: Patient agreed to angiography this morning.  Notify vascular surgery 11/5: Status post angiography.  Appreciate podiatry follow-up.  Current plan for surgical intervention with Dr. Luana Shu on 11/7 11/7: N.p.o. for debridement with podiatry today.  Patient aware of surgical plan and in agreement.  N.p.o. 11/8: Postop day 1 status post extensive debridement and wound VAC placement 11/11: podiatry has placed wound care and f/u recs. Stable for discharge, awaiting snf insurance authorization   Subjective   Patient seen examined, no new complaints.   Assessment/Plan:     Sepsis, POA -Presented with tachycardia, leukocytosis due to cellulitis -Resolved  Severe bilateral lower extremity cellulitis -Completed antibiotic course on 05/04/2021 -S/p angiogram with successful recanalization right lower extremity for limb salvage on  04/27/2021 -S/p angiogram of left lower extremity, successful on 11/4 -S/p extensive debridement and wound VAC placement with podiatry on 11/7 -Will need  3x weekly dry dressing change to right foot, 3x weekly wound vac changes left heel, 3x weekly xeroform dressings to lower extremity wounds. D/u wound center (patient is established there). F/u podiatry 2 weeks Dr. Vickki Muff. Nonweightbearing left heel, weightbearing right heel as needed for transfer  BPPV -Continue meclizine as needed  Acute kidney injury on CKD stage IIIb -Creatinine stable at 1.10  Hypokalemia -Potassium is 3.3 -We will replace potassium and follow BMP in am  Hypertension -Continue clonidine, metoprolol, irbesartan, amlodipine, Lasix  Diabetes mellitus type 2 -Continue glargine -Sliding scale insulin NovoLog -CBG well controlled  Hypothyroidism -Continue Synthroid    Medications     acidophilus  2 capsule Oral Daily   amLODipine  10 mg Oral Daily   atorvastatin  40 mg Oral Daily   cloNIDine  0.3 mg Oral TID   clopidogrel  75 mg Oral Q breakfast   enoxaparin (LOVENOX) injection  0.5 mg/kg Subcutaneous Q24H   furosemide  20 mg Oral BID   insulin aspart  0-15 Units Subcutaneous TID WC   insulin aspart  0-5 Units Subcutaneous QHS   insulin glargine-yfgn  8 Units Subcutaneous QHS   irbesartan  150 mg Oral Daily   levothyroxine  112 mcg Oral Q0600   metoprolol succinate  50 mg Oral Q breakfast   sodium chloride flush  3 mL Intravenous Q12H     Data Reviewed:   CBG:  Recent Labs  Lab 05/12/21 1614 05/12/21 2144 05/13/21 0827 05/13/21 1108 05/13/21 1544  GLUCAP 86 127* 119* 167* 100*    SpO2: 98 % O2 Flow Rate (L/min): 2 L/min  Vitals:   05/13/21 0413 05/13/21 0830 05/13/21 0905 05/13/21 1546  BP: (!) 165/63 (!) 182/55  (!) 157/52  Pulse: (!) 58 (!) 54 63 (!) 57  Resp: 20 16  16   Temp: 97.8 F (36.6 C) 98.3 F (36.8 C)  98.1 F (36.7 C)  TempSrc: Oral Oral  Oral  SpO2: 99% 98%   98%  Weight:      Height:         Intake/Output Summary (Last 24 hours) at 05/13/2021 1836 Last data filed at 05/13/2021 1029 Gross per 24 hour  Intake 240 ml  Output 1050 ml  Net -810 ml    11/15 1901 - 11/17 0700 In: 540 [P.O.:540] Out: 2180 [Urine:2100; Drains:80]  Filed Weights   04/16/21 2244 04/17/21 0403 05/03/21 1640  Weight: 122.5 kg 118.5 kg 118.5 kg    Data Reviewed: Basic Metabolic Panel: Recent Labs  Lab 05/07/21 0418 05/10/21 0655 05/11/21 0656 05/12/21 0608 05/13/21 0420  NA 139 137 138 137 138  K 4.2 4.0 3.9 3.5 3.3*  CL 115* 109 107 107 102  CO2 20* 20* 23 24 25   GLUCOSE 125* 125* 103* 89 114*  BUN 17 14 11 12 11   CREATININE 1.27* 1.19* 1.10* 1.11* 1.10*  CALCIUM 8.0* 8.2* 8.3* 7.9* 7.9*   Liver Function Tests: No results for input(s): AST, ALT, ALKPHOS, BILITOT, PROT, ALBUMIN in the last 168 hours. No results for input(s): LIPASE, AMYLASE in the last 168 hours. No results for input(s): AMMONIA in the last 168 hours. CBC: Recent Labs  Lab 05/10/21 0655 05/13/21 0420  WBC 4.4 5.1  HGB 9.6* 10.4*  HCT 31.1* 33.8*  MCV 96.3 95.2  PLT 280 282   Cardiac Enzymes: No results for input(s): CKTOTAL, CKMB, CKMBINDEX, TROPONINI in the last 168 hours. BNP (last 3 results) Recent Labs    04/17/21 0019  BNP 104.8*    ProBNP (last 3 results) No results for input(s): PROBNP in the last 8760 hours.  CBG: Recent Labs  Lab 05/12/21 1614 05/12/21 2144 05/13/21 0827 05/13/21 1108 05/13/21 1544  GLUCAP 86 127* 119* 167* 100*       Radiology Reports  No results found.     Antibiotics: Anti-infectives (From admission, onward)    Start     Dose/Rate Route Frequency Ordered Stop   05/05/21 2200  doxycycline (VIBRA-TABS) tablet 100 mg        100 mg Oral Every 12 hours 05/05/21 1621 05/10/21 0906   05/05/21 1800  cephALEXin (KEFLEX) capsule 500 mg        500 mg Oral Every 6 hours 05/05/21 1621 05/10/21 1206   05/01/21 2200   Ampicillin-Sulbactam (UNASYN) 3 g in sodium chloride 0.9 % 100 mL IVPB        3 g 200 mL/hr over 30 Minutes Intravenous Every 6 hours 05/01/21 1423 05/04/21 1711   05/01/21 0000  ceFAZolin (ANCEF) IVPB 1 g/50 mL premix  Status:  Discontinued       Note to Pharmacy: To be given in specials   1 g 100 mL/hr over 30 Minutes Intravenous  Once 04/30/21 1144 04/30/21 1158   04/29/21 1158  ceFAZolin (ANCEF) IVPB 2g/100 mL premix        2 g 200 mL/hr over 30 Minutes Intravenous 30 min pre-op 04/29/21 1158 04/30/21 1235   04/28/21 0800  ceFAZolin (ANCEF) IVPB 2g/100 mL premix       Note to Pharmacy: To be given in specials   2 g 200 mL/hr  over 30 Minutes Intravenous  Once 04/27/21 1302 04/27/21 1526   04/27/21 1200  metroNIDAZOLE (FLAGYL) IVPB 500 mg  Status:  Discontinued        500 mg 100 mL/hr over 60 Minutes Intravenous Every 12 hours 04/27/21 1106 05/01/21 1423   04/24/21 2200  ceFEPIme (MAXIPIME) 2 g in sodium chloride 0.9 % 100 mL IVPB  Status:  Discontinued        2 g 200 mL/hr over 30 Minutes Intravenous Every 12 hours 04/24/21 1658 05/01/21 1423   04/20/21 1200  ceFAZolin (ANCEF) IVPB 1 g/50 mL premix  Status:  Discontinued        1 g 100 mL/hr over 30 Minutes Intravenous Every 8 hours 04/20/21 1016 04/24/21 1656   04/18/21 1100  vancomycin (VANCOCIN) IVPB 1000 mg/200 mL premix  Status:  Discontinued        1,000 mg 200 mL/hr over 60 Minutes Intravenous Every 24 hours 04/18/21 0954 04/19/21 1455   04/17/21 1100  ceFEPIme (MAXIPIME) 2 g in sodium chloride 0.9 % 100 mL IVPB  Status:  Discontinued        2 g 200 mL/hr over 30 Minutes Intravenous Every 12 hours 04/17/21 0312 04/20/21 1016   04/17/21 1000  vancomycin (VANCOREADY) IVPB 750 mg/150 mL  Status:  Discontinued        750 mg 150 mL/hr over 60 Minutes Intravenous Every 24 hours 04/17/21 0614 04/18/21 0954   04/17/21 0330  metroNIDAZOLE (FLAGYL) IVPB 500 mg  Status:  Discontinued        500 mg 100 mL/hr over 60 Minutes  Intravenous 2 times daily 04/17/21 0256 04/20/21 1016   04/17/21 0300  ceFEPIme (MAXIPIME) 2 g in sodium chloride 0.9 % 100 mL IVPB  Status:  Discontinued        2 g 200 mL/hr over 30 Minutes Intravenous  Once 04/17/21 0256 04/17/21 0311   04/17/21 0300  vancomycin (VANCOCIN) IVPB 1000 mg/200 mL premix  Status:  Discontinued        1,000 mg 200 mL/hr over 60 Minutes Intravenous  Once 04/17/21 0256 04/17/21 0316   04/16/21 2345  ceFEPIme (MAXIPIME) 2 g in sodium chloride 0.9 % 100 mL IVPB        2 g 200 mL/hr over 30 Minutes Intravenous  Once 04/16/21 2333 04/17/21 0130   04/16/21 2345  vancomycin (VANCOREADY) IVPB 2000 mg/400 mL        2,000 mg 200 mL/hr over 120 Minutes Intravenous  Once 04/16/21 2334 04/17/21 0332         DVT prophylaxis: Lovenox  Code Status: Full code  Family Communication: No family at bedside   Consultants:   Procedures:     Objective    Physical Examination:  General-appears in no acute distress Heart-S1-S2, regular, no murmur auscultated Lungs-clear to auscultation bilaterally, no wheezing or crackles auscultated Abdomen-soft, nontender, no organomegaly Extremities-no edema in the lower extremities Neuro-alert, oriented x3, no focal deficit noted  Status is: Inpatient  Dispo: The patient is from: Home              Anticipated d/c is to: Skilled nursing facility              Anticipated d/c date is: 05/19/2021              Patient currently not stable for discharge  Barrier to discharge-awaiting bed at skilled nursing facility  COVID-19 Labs  No results for input(s): DDIMER, FERRITIN, LDH, CRP in the last 72  hours.  Lab Results  Component Value Date   SARSCOV2NAA NEGATIVE 05/11/2021   [place] NEGATIVE 04/17/2021     Pressure Injury 04/17/21 Sacrum Medial Stage 2 -  Partial thickness loss of dermis presenting as a shallow open injury with a red, pink wound bed without slough. (Active)  04/17/21 0444  Location: Sacrum   Location Orientation: Medial  Staging: Stage 2 -  Partial thickness loss of dermis presenting as a shallow open injury with a red, pink wound bed without slough.  Wound Description (Comments):   Present on Admission: Yes     Pressure Injury 04/17/21 Heel Left;Posterior Unstageable - Full thickness tissue loss in which the base of the injury is covered by slough (yellow, tan, gray, green or brown) and/or eschar (tan, brown or black) in the wound bed. (Active)  04/17/21   Location: Heel  Location Orientation: Left;Posterior  Staging: Unstageable - Full thickness tissue loss in which the base of the injury is covered by slough (yellow, tan, gray, green or brown) and/or eschar (tan, brown or black) in the wound bed.  Wound Description (Comments):   Present on Admission: Yes        Recent Results (from the past 240 hour(s))  Resp Panel by RT-PCR (Flu A&B, Covid) Nasopharyngeal Swab     Status: None   Collection Time: 05/11/21  1:19 PM   Specimen: Nasopharyngeal Swab; Nasopharyngeal(NP) swabs in vial transport medium  Result Value Ref Range Status   SARS Coronavirus 2 by RT PCR NEGATIVE NEGATIVE Final    Comment: (NOTE) SARS-CoV-2 target nucleic acids are NOT DETECTED.  The SARS-CoV-2 RNA is generally detectable in upper respiratory specimens during the acute phase of infection. The lowest concentration of SARS-CoV-2 viral copies this assay can detect is 138 copies/mL. A negative result does not preclude SARS-Cov-2 infection and should not be used as the sole basis for treatment or other patient management decisions. A negative result may occur with  improper specimen collection/handling, submission of specimen other than nasopharyngeal swab, presence of viral mutation(s) within the areas targeted by this assay, and inadequate number of viral copies(<138 copies/mL). A negative result must be combined with clinical observations, patient history, and epidemiological information. The  expected result is Negative.  Fact Sheet for Patients:  EntrepreneurPulse.com.au  Fact Sheet for Healthcare Providers:  IncredibleEmployment.be  This test is no t yet approved or cleared by the [place] FDA and  has been authorized for detection and/or diagnosis of SARS-CoV-2 by FDA under an Emergency Use Authorization (EUA). This EUA will remain  in effect (meaning this test can be used) for the duration of the COVID-19 declaration under Section 564(b)(1) of the Act, 21 U.S.C.section 360bbb-3(b)(1), unless the authorization is terminated  or revoked sooner.       Influenza A by PCR NEGATIVE NEGATIVE Final   Influenza B by PCR NEGATIVE NEGATIVE Final    Comment: (NOTE) The Xpert Xpress SARS-CoV-2/FLU/RSV plus assay is intended as an aid in the diagnosis of influenza from Nasopharyngeal swab specimens and should not be used as a sole basis for treatment. Nasal washings and aspirates are unacceptable for Xpert Xpress SARS-CoV-2/FLU/RSV testing.  Fact Sheet for Patients: EntrepreneurPulse.com.au  Fact Sheet for Healthcare Providers: IncredibleEmployment.be  This test is not yet approved or cleared by the [place] FDA and has been authorized for detection and/or diagnosis of SARS-CoV-2 by FDA under an Emergency Use Authorization (EUA). This EUA will remain in effect (meaning this test can be used) for the duration of  the COVID-19 declaration under Section 564(b)(1) of the Act, 21 U.S.C. section 360bbb-3(b)(1), unless the authorization is terminated or revoked.  Performed at [facility], [address], [place], [place] [address]     [facility] If 7PM-7AM, please contact night-coverage at [email or web address], Office  [phone]   05/13/2021, 6:36 PM  LOS: 26 days

## 2021-05-14 DIAGNOSIS — L03119 Cellulitis of unspecified part of limb: Secondary | ICD-10-CM | POA: Diagnosis not present

## 2021-05-14 DIAGNOSIS — N179 Acute kidney failure, unspecified: Secondary | ICD-10-CM | POA: Diagnosis not present

## 2021-05-14 LAB — GLUCOSE, CAPILLARY
Glucose-Capillary: 94 mg/dL (ref 70–99)
Glucose-Capillary: 135 mg/dL — ABNORMAL HIGH (ref 70–99)
Glucose-Capillary: 79 mg/dL (ref 70–99)
Glucose-Capillary: 103 mg/dL — ABNORMAL HIGH (ref 70–99)

## 2021-05-14 LAB — BASIC METABOLIC PANEL
Anion gap: 9 (ref 5–15)
BUN: 12 mg/dL (ref 8–23)
CO2: 26 mmol/L (ref 22–32)
Calcium: 7.9 mg/dL — ABNORMAL LOW (ref 8.9–10.3)
Chloride: 104 mmol/L (ref 98–111)
Creatinine, Ser: 1.08 mg/dL — ABNORMAL HIGH (ref 0.44–1.00)
GFR, Estimated: 56 mL/min — ABNORMAL LOW (ref >60)
Glucose, Bld: 97 mg/dL (ref 70–99)
Potassium: 3.3 mmol/L — ABNORMAL LOW (ref 3.5–5.1)
Sodium: 139 mmol/L (ref 135–145)

## 2021-05-14 MED ORDER — POTASSIUM CHLORIDE CRYS ER 20 MEQ PO TBCR
40.0000 meq | EXTENDED_RELEASE_TABLET | Freq: Once | ORAL | Status: AC
  Administered 2021-05-14: 40 meq via ORAL
  Filled 2021-05-14: qty 2

## 2021-05-14 NOTE — TOC Progression Note (Addendum)
Transition of Care [facility]) - Progression Note    Patient Details  Name: [name] MRN: [id number] Date of Birth: [birth year]  Transition of Care [facility]) CM/SW [place], LCSW Phone Number: 05/14/2021, 10:30 AM  Clinical Narrative:  Insurance authorization still pending.   3:14 pm: Will plan for discharge to [facility] on Monday if auth approved. MD and RN aware.  Expected Discharge Plan: [place] Barriers to Discharge: Continued Medical Work up  Expected Discharge Plan and Services Expected Discharge Plan: [place] arrangements for the past 2 months: Single Family Home                                       Social Determinants of Health (SDOH) Interventions    Readmission Risk Interventions No flowsheet data found.

## 2021-05-14 NOTE — Care Management Important Message (Signed)
Important Message  Patient Details  Name: ANYLA ISRAELSON MRN: [id number] Date of Birth: 1956/05/07   Medicare Important Message Given:  Yes     Dannette Barbara 05/14/2021, 10:39 AM

## 2021-05-14 NOTE — Progress Notes (Addendum)
PROGRESS NOTE    As per Dr. Si Raider: Unfortunate 65 year old female with extensive past medical history who was brought in via EMS for evaluation of bilateral lower extremity redness swelling and pain.  Apparently patient has been in her wheelchair at home unable to move for several days.  Lower extremity significant cellulitic changes.  On broad-spectrum antibiotics.  Also some question of fracture in the feet on the right lower extremity.   Podiatry consulted on admission,Concern regarding vascular supply lower extremities.  Podiatry apparently reached out to vascular surgery for consultation prior to any surgical intervention.  Awaiting vascular evaluation to assess lower extremity vasculopathy. WOC following. ABI completed with results significant for severe bilateral peripheral arterial disease.    Angiogram delayed several times due to patient refusal and concerned about capacity.  Angiogram completed on 11/1.  Successful recanalization right lower extremity for limb salvage.  Podiatry is deferring surgical intervention until we have angiography and revascularization attempt on both lower extremities.  Now the right is been completed the patient is apparently refusing left lower extremity angiogram.  Vascular surgery is offered general anesthesia but at this time the patient continues to refuse.   11/4: Patient agreed to angiography this morning.  Notify vascular surgery 11/5: Status post angiography.  Appreciate podiatry follow-up.  Current plan for surgical intervention with Dr. Luana Shu on 11/7 11/7: N.p.o. for debridement with podiatry today.  Patient aware of surgical plan and in agreement.  N.p.o. 11/8: Postop day 1 status post extensive debridement and wound VAC placement 11/11: podiatry has placed wound care and f/u recs. Stable for discharge, awaiting snf insurance authorization  As per Dr. Jimmye Norman 11/16 & 05/14/21: Medically stable for d/c but still waiting on insurance auth.     MALAISHA [name]  HWE:[id number] DOB: 08-Jul-1955 DOA: 04/16/2021 PCP: Juline Patch, MD  Assessment & Plan:   Principal Problem:   Adjustment disorder with anxiety Active Problems:   Sepsis due to cellulitis ([place])   Pressure injury of skin   Left leg pain  Sepsis: met criteria w/ tachycardia, leukocytosis & cellulitis  Severe bilateral lower extremity cellulitis:  nonpurulent. Completed abx course on 05/04/21. S/p angiogram with successful recanalization right lower extremity for limb salvage on 11/1. S/p angiogram of left lower extremity successful on 11/4. S/p extensive debridement and wound VAC placement with podiatry on 11/7. Continue w/ wound care. Will need 3x weekly dry dressing change to right foot, 3x weekly wound vac changes left heel, 3x weekly xeroform dressings to lower extremity wounds. F/u wound center (patient is established there). F/u podiatry 2 weeks Dr. Vickki Muff. Nonweightbearing left heel, weightbearing right heel as needed for transfer  BPPV: meclizine prn. Had vestibular exercises   Hypokalemia: KCl repleated.    AKI on CKDIIIb: Cr is trending down slightly from day prior    Hypothyroidism: continue on synthroid    DM2: continue on glargine, SSI w/ accuchecks   HTN: continue on metoprolol, amlodipine, clonidine, irbesartan & lasix   Morbid obesity: BMI 43.4. Complicates overall care & prognosis   Poor self-care: DSS is following    DVT prophylaxis: lovenox  Code Status: full  Family Communication: Disposition Plan: waiting on insurance auth still   Level of care: Med-Surg  Status is: Inpatient  Remains inpatient appropriate because: medically stable for d/c. Waiting on insurance auth      Consultants:  Podiatry   Procedures:   Antimicrobials:    Subjective: Pt c/o fatigue   Objective: Vitals:   05/13/21 1546 05/13/21 1926  05/14/21 0518 05/14/21 0836  BP: (!) 157/52 (!) 158/53 (!) 171/56 (!) 158/59  Pulse: (!) 57 (!) 57 (!) 51  (!) 59  Resp: _0 Temp: 98.1 F (36.7 C) (!) 97.4 F (36.3 C) 97.8 F (36.6 C) (!) 97.2 F (36.2 C)  TempSrc: Oral Oral  Oral  SpO2: 98% 96% 98% 99%  Weight:      Height:        Intake/Output Summary (Last 24 hours) at 05/14/2021 1500 Last data filed at 05/14/2021 1021 Gross per 24 hour  Intake 0 ml  Output 501 ml  Net -501 ml   Filed Weights   04/16/21 2244 04/17/21 0403 05/03/21 1640  Weight: 122.5 kg 118.5 kg 118.5 kg    Examination:  General exam: Appears comfortable  Respiratory system: clear breath sounds b/l  Cardiovascular system: S1/S2+. No rubs or gallops.  Gastrointestinal system: Abd is soft, NT, ND & normal bowel sounds  Central nervous system: Alert and oriented. Moves all extremities  Psychiatry: judgement and insight appear normal. Flat mood and affect   Data Reviewed: I have personally reviewed following labs and imaging studies  CBC: Recent Labs  Lab 05/10/21 0655 05/13/21 0420  WBC 4.4 5.1  HGB 9.6* 10.4*  HCT 31.1* 33.8*  MCV 96.3 95.2  PLT 280 882   Basic Metabolic Panel: Recent Labs  Lab 05/10/21 0655 05/11/21 0656 05/12/21 0608 05/13/21 0420 05/14/21 0413  NA 137 138 137 138 139  K 4.0 3.9 3.5 3.3* 3.3*  CL 109 107 107 102 104  CO2 20* _1 GLUCOSE 125* 103* 89 114* 97  BUN _2 CREATININE 1.19* 1.10* 1.11* 1.10* 1.08*  CALCIUM 8.2* 8.3* 7.9* 7.9* 7.9*   GFR: Estimated Creatinine Clearance: 65.1 mL/min (A) (by C-G formula based on SCr of 1.08 mg/dL (H)). Liver Function Tests: No results for input(s): AST, ALT, ALKPHOS, BILITOT, PROT, ALBUMIN in the last 168 hours. No results for input(s): LIPASE, AMYLASE in the last 168 hours. No results for input(s): AMMONIA in the last 168 hours. Coagulation Profile: No results for input(s): INR, PROTIME in the last 168 hours. Cardiac Enzymes: No results for input(s): CKTOTAL, CKMB, CKMBINDEX, TROPONINI in the last 168 hours. BNP (last 3 results) No results  for input(s): PROBNP in the last 8760 hours. HbA1C: No results for input(s): HGBA1C in the last 72 hours. CBG: Recent Labs  Lab 05/13/21 1108 05/13/21 1544 05/13/21 2058 05/14/21 0811 05/14/21 1137  GLUCAP 167* 100* 113* 94 135*   Lipid Profile: No results for input(s): CHOL, HDL, LDLCALC, TRIG, CHOLHDL, LDLDIRECT in the last 72 hours. Thyroid Function Tests: No results for input(s): TSH, T4TOTAL, FREET4, T3FREE, THYROIDAB in the last 72 hours. Anemia Panel: No results for input(s): VITAMINB12, FOLATE, FERRITIN, TIBC, IRON, RETICCTPCT in the last 72 hours. Sepsis Labs: No results for input(s): PROCALCITON, LATICACIDVEN in the last 168 hours.  Recent Results (from the past 240 hour(s))  Resp Panel by RT-PCR (Flu A&B, Covid) Nasopharyngeal Swab     Status: None   Collection Time: 05/11/21  1:19 PM   Specimen: Nasopharyngeal Swab; Nasopharyngeal(NP) swabs in vial transport medium  Result Value Ref Range Status   SARS Coronavirus 2 by RT PCR NEGATIVE NEGATIVE Final    Comment: (NOTE) SARS-CoV-2 target nucleic acids are NOT DETECTED.  The SARS-CoV-2 RNA is generally detectable in upper respiratory specimens during the acute phase of infection. The lowest concentration of SARS-CoV-2 viral copies this  assay can detect is 138 copies/mL. A negative result does not preclude SARS-Cov-2 infection and should not be used as the sole basis for treatment or other patient management decisions. A negative result may occur with  improper specimen collection/handling, submission of specimen other than nasopharyngeal swab, presence of viral mutation(s) within the areas targeted by this assay, and inadequate number of viral copies(<138 copies/mL). A negative result must be combined with clinical observations, patient history, and epidemiological information. The expected result is Negative.  Fact Sheet for Patients:  EntrepreneurPulse.com.au  Fact Sheet for Healthcare  Providers:  IncredibleEmployment.be  This test is no t yet approved or cleared by the [place] FDA and  has been authorized for detection and/or diagnosis of SARS-CoV-2 by FDA under an Emergency Use Authorization (EUA). This EUA will remain  in effect (meaning this test can be used) for the duration of the COVID-19 declaration under Section 564(b)(1) of the Act, 21 U.S.C.section 360bbb-3(b)(1), unless the authorization is terminated  or revoked sooner.       Influenza A by PCR NEGATIVE NEGATIVE Final   Influenza B by PCR NEGATIVE NEGATIVE Final    Comment: (NOTE) The Xpert Xpress SARS-CoV-2/FLU/RSV plus assay is intended as an aid in the diagnosis of influenza from Nasopharyngeal swab specimens and should not be used as a sole basis for treatment. Nasal washings and aspirates are unacceptable for Xpert Xpress SARS-CoV-2/FLU/RSV testing.  Fact Sheet for Patients: EntrepreneurPulse.com.au  Fact Sheet for Healthcare Providers: IncredibleEmployment.be  This test is not yet approved or cleared by the [place] FDA and has been authorized for detection and/or diagnosis of SARS-CoV-2 by FDA under an Emergency Use Authorization (EUA). This EUA will remain in effect (meaning this test can be used) for the duration of the COVID-19 declaration under Section 564(b)(1) of the Act, 21 U.S.C. section 360bbb-3(b)(1), unless the authorization is terminated or revoked.  Performed at [facility], [address], [place], [place] [address]          Radiology Studies: No results found.      Scheduled Meds:  acidophilus  2 capsule Oral Daily   amLODipine  10 mg Oral Daily   atorvastatin  40 mg Oral Daily   cloNIDine  0.3 mg Oral TID   clopidogrel  75 mg Oral Q breakfast   enoxaparin (LOVENOX) injection  0.5 mg/kg Subcutaneous Q24H   furosemide  20 mg Oral BID   insulin aspart  0-15 Units Subcutaneous TID WC    insulin aspart  0-5 Units Subcutaneous QHS   insulin glargine-yfgn  8 Units Subcutaneous QHS   irbesartan  150 mg Oral Daily   levothyroxine  112 mcg Oral Q0600   metoprolol succinate  50 mg Oral Q breakfast   sodium chloride flush  3 mL Intravenous Q12H   Continuous Infusions:   LOS: 27 days    Time spent: 30 mins     Wyvonnia Dusky, MD [facility] Pager [phone]  If 7PM-7AM, please contact night-coverage 05/14/2021, 3:00 PM

## 2021-05-15 DIAGNOSIS — L89152 Pressure ulcer of sacral region, stage 2: Secondary | ICD-10-CM

## 2021-05-15 DIAGNOSIS — A419 Sepsis, unspecified organism: Secondary | ICD-10-CM

## 2021-05-15 DIAGNOSIS — E1169 Type 2 diabetes mellitus with other specified complication: Secondary | ICD-10-CM

## 2021-05-15 DIAGNOSIS — N189 Chronic kidney disease, unspecified: Secondary | ICD-10-CM

## 2021-05-15 DIAGNOSIS — N179 Acute kidney failure, unspecified: Secondary | ICD-10-CM

## 2021-05-15 DIAGNOSIS — E785 Hyperlipidemia, unspecified: Secondary | ICD-10-CM

## 2021-05-15 LAB — CBC
HCT: 32.9 % — ABNORMAL LOW (ref 36.0–46.0)
Hemoglobin: 10.4 g/dL — ABNORMAL LOW (ref 12.0–15.0)
MCH: 30 pg (ref 26.0–34.0)
MCHC: 31.6 g/dL (ref 30.0–36.0)
MCV: 94.8 fL (ref 80.0–100.0)
Platelets: 275 10*3/uL (ref 150–400)
RBC: 3.47 MIL/uL — ABNORMAL LOW (ref 3.87–5.11)
RDW: 15.2 % (ref 11.5–15.5)
WBC: 6.2 10*3/uL (ref 4.0–10.5)
nRBC: 0 % (ref 0.0–0.2)

## 2021-05-15 LAB — BASIC METABOLIC PANEL
Anion gap: 9 (ref 5–15)
BUN: 11 mg/dL (ref 8–23)
CO2: 26 mmol/L (ref 22–32)
Calcium: 8.1 mg/dL — ABNORMAL LOW (ref 8.9–10.3)
Chloride: 101 mmol/L (ref 98–111)
Creatinine, Ser: 1.11 mg/dL — ABNORMAL HIGH (ref 0.44–1.00)
GFR, Estimated: 54 mL/min — ABNORMAL LOW (ref >60)
Glucose, Bld: 125 mg/dL — ABNORMAL HIGH (ref 70–99)
Potassium: 3.5 mmol/L (ref 3.5–5.1)
Sodium: 136 mmol/L (ref 135–145)

## 2021-05-15 LAB — GLUCOSE, CAPILLARY
Glucose-Capillary: 114 mg/dL — ABNORMAL HIGH (ref 70–99)
Glucose-Capillary: 161 mg/dL — ABNORMAL HIGH (ref 70–99)
Glucose-Capillary: 103 mg/dL — ABNORMAL HIGH (ref 70–99)
Glucose-Capillary: 147 mg/dL — ABNORMAL HIGH (ref 70–99)

## 2021-05-15 LAB — MAGNESIUM: Magnesium: 1.6 mg/dL — ABNORMAL LOW (ref 1.7–2.4)

## 2021-05-15 MED ORDER — MAGNESIUM SULFATE 2 GM/50ML IV SOLN
2.0000 g | Freq: Once | INTRAVENOUS | Status: AC
  Administered 2021-05-15: 2 g via INTRAVENOUS
  Filled 2021-05-15: qty 50

## 2021-05-15 NOTE — Progress Notes (Signed)
Patient ID: [name], female   DOB: 12-Jul-1955, 65 y.o.   MRN: [id number] [facility] PROGRESS NOTE  LICET DUNPHY EYC:[id number] DOB: 1955/11/09 DOA: 04/16/2021 PCP: Juline Patch, MD  HPI/Subjective: Patient admitted 28 days ago with leg and foot pain and was admitted with sepsis.  Patient was concerned however to go to the bathroom at the rehab center he stood 1 Foot and Heel Touch on the Other Foot.  Objective: Vitals:   05/15/21 0447 05/15/21 0800  BP: (!) 163/60 (!) 146/57  Pulse: 64 (!) 59  Resp: 16 18  Temp: 98.1 F (36.7 C) 98 F (36.7 C)  SpO2: 97% 97%    Intake/Output Summary (Last 24 hours) at 05/15/2021 1424 Last data filed at 05/15/2021 1034 Gross per 24 hour  Intake 180 ml  Output 600 ml  Net -420 ml   Filed Weights   04/16/21 2244 04/17/21 0403 05/03/21 1640  Weight: 122.5 kg 118.5 kg 118.5 kg    ROS: Review of Systems  Respiratory:  Negative for shortness of breath.   Cardiovascular:  Negative for chest pain.  Gastrointestinal:  Negative for abdominal pain, nausea and vomiting.  Exam: Physical Exam HENT:     Head: Normocephalic.     Mouth/Throat:     Pharynx: No oropharyngeal exudate.  Eyes:     General: Lids are normal.     Conjunctiva/sclera: Conjunctivae normal.  Cardiovascular:     Rate and Rhythm: Normal rate and regular rhythm.     Heart sounds: Normal heart sounds, S1 normal and S2 normal.  Pulmonary:     Breath sounds: No decreased breath sounds, wheezing, rhonchi or rales.  Abdominal:     Palpations: Abdomen is soft.     Tenderness: There is no abdominal tenderness.  Musculoskeletal:     Right lower leg: Swelling present.     Left lower leg: Swelling present.  Skin:    General: Skin is warm.     Comments: Bilateral legs covered.  Sutures seen on right first toe.  Left heel covered with wound VAC.  Neurological:     Mental Status: She is alert and oriented to person, place, and time.      Scheduled Meds:   acidophilus  2 capsule Oral Daily   amLODipine  10 mg Oral Daily   atorvastatin  40 mg Oral Daily   cloNIDine  0.3 mg Oral TID   clopidogrel  75 mg Oral Q breakfast   enoxaparin (LOVENOX) injection  0.5 mg/kg Subcutaneous Q24H   furosemide  20 mg Oral BID   insulin aspart  0-15 Units Subcutaneous TID WC   insulin aspart  0-5 Units Subcutaneous QHS   insulin glargine-yfgn  8 Units Subcutaneous QHS   irbesartan  150 mg Oral Daily   levothyroxine  112 mcg Oral Q0600   metoprolol succinate  50 mg Oral Q breakfast   sodium chloride flush  3 mL Intravenous Q12H   Continuous Infusions:  magnesium sulfate bolus IVPB 2 g (05/15/21 1401)    Assessment/Plan:  Sepsis, present on admission with bilateral lower extremity cellulitis, tachycardia and leukocytosis on presentation.  Patient completed antibiotic course on 05/04/2021.  Patient had successful angiogram with recanalization of right lower extremity for limb salvage on 04/27/2021 and left lower extremity angiogram on 04/30/2021.  Patient had extensive debridement with podiatry on 05/03/2021 and is on a wound VAC.  Nonweightbearing left heel and right toes. Sacral decubiti, stage II, present on admission Acute kidney injury on chronic  kidney disease stage IIIa.  Creatinine up to 1.52 on 05/04/2021 and is down to 1.11 on 05/15/2021. Benign positional vertigo on meclizine as needed Hypothyroidism unspecified on Synthroid Type 2 diabetes mellitus with hyperlipidemia on atorvastatin and glargine insulin with sliding scale Essential hypertension on irbesartan and metoprolol, clonidine, amlodipine and Lasix Morbid obesity with a BMI 43.47 Patient lives by herself and is hopeful to get back to that point.  At this point is an unsafe discharge home.  Will need rehab.  Awaiting insurance authorization  Pressure Injury 04/17/21 Sacrum Medial Stage 2 -  Partial thickness loss of dermis presenting as a shallow open injury with a red, pink wound bed without  slough. (Active)  04/17/21 0444  Location: Sacrum  Location Orientation: Medial  Staging: Stage 2 -  Partial thickness loss of dermis presenting as a shallow open injury with a red, pink wound bed without slough.  Wound Description (Comments):   Present on Admission: Yes     Pressure Injury 04/17/21 Heel Left;Posterior Unstageable - Full thickness tissue loss in which the base of the injury is covered by slough (yellow, tan, gray, green or brown) and/or eschar (tan, brown or black) in the wound bed. (Active)  04/17/21   Location: Heel  Location Orientation: Left;Posterior  Staging: Unstageable - Full thickness tissue loss in which the base of the injury is covered by slough (yellow, tan, gray, green or brown) and/or eschar (tan, brown or black) in the wound bed.  Wound Description (Comments):   Present on Admission: Yes       Code Status:     Code Status Orders  (From admission, onward)           Start     Ordered   04/17/21 0241  Full code  Continuous        04/17/21 0256           Code Status History     Date Active Date Inactive Code Status Order ID Comments User Context   05/28/2015 1519 06/01/2015 1929 Full Code [id number]  Hessie Knows, MD Inpatient      Family Communication: Declined Disposition Plan: Status is: Inpatient.  Stable for discharge out to rehab.  Awaiting insurance authorization  Time spent: 28 minutes  [place] [employer]

## 2021-05-16 DIAGNOSIS — L89152 Pressure ulcer of sacral region, stage 2: Secondary | ICD-10-CM | POA: Diagnosis not present

## 2021-05-16 DIAGNOSIS — E1169 Type 2 diabetes mellitus with other specified complication: Secondary | ICD-10-CM | POA: Diagnosis not present

## 2021-05-16 DIAGNOSIS — A419 Sepsis, unspecified organism: Secondary | ICD-10-CM | POA: Diagnosis not present

## 2021-05-16 DIAGNOSIS — N179 Acute kidney failure, unspecified: Secondary | ICD-10-CM | POA: Diagnosis not present

## 2021-05-16 LAB — GLUCOSE, CAPILLARY
Glucose-Capillary: 79 mg/dL (ref 70–99)
Glucose-Capillary: 121 mg/dL — ABNORMAL HIGH (ref 70–99)
Glucose-Capillary: 130 mg/dL — ABNORMAL HIGH (ref 70–99)
Glucose-Capillary: 129 mg/dL — ABNORMAL HIGH (ref 70–99)

## 2021-05-16 NOTE — Progress Notes (Signed)
Patient ID: [name], female   DOB: [birth year], 65 y.o.   MRN: [id number] [facility] PROGRESS NOTE  DENNA FRYBERGER YKZ:[id number] DOB: [birth year] DOA: 04/16/2021 PCP: Juline Patch, MD  HPI/Subjective: Patient feeling okay.  Concerned about her foot and ambulation.  Admitted 29 days ago with leg and foot pain and was admitted with sepsis.  Objective: Vitals:   05/16/21 0535 05/16/21 0821  BP: (!) 155/54 (!) 152/52  Pulse: (!) 57 60  Resp: 16 16  Temp: 98.3 F (36.8 C) 98.4 F (36.9 C)  SpO2: 97% 98%    Intake/Output Summary (Last 24 hours) at 05/16/2021 1414 Last data filed at 05/16/2021 1022 Gross per 24 hour  Intake 660 ml  Output 800 ml  Net -140 ml   Filed Weights   04/16/21 2244 04/17/21 0403 05/03/21 1640  Weight: 122.5 kg 118.5 kg 118.5 kg    ROS: Review of Systems  Respiratory:  Negative for shortness of breath.   Cardiovascular:  Negative for chest pain.  Gastrointestinal:  Negative for abdominal pain, nausea and vomiting.  Exam: Physical Exam HENT:     Head: Normocephalic.     Mouth/Throat:     Pharynx: No oropharyngeal exudate.  Eyes:     General: Lids are normal.     Conjunctiva/sclera: Conjunctivae normal.  Cardiovascular:     Rate and Rhythm: Normal rate and regular rhythm.     Heart sounds: Normal heart sounds, S1 normal and S2 normal.  Pulmonary:     Breath sounds: No decreased breath sounds, wheezing, rhonchi or rales.  Abdominal:     Palpations: Abdomen is soft.     Tenderness: There is no abdominal tenderness.  Musculoskeletal:     Right lower leg: Swelling present.     Left lower leg: Swelling present.  Skin:    General: Skin is warm.     Comments: Bilateral legs wrapped.  Right first toe partial amputation.  Left heel wound VAC.  Neurological:     Mental Status: She is alert and oriented to person, place, and time.      Scheduled Meds:  acidophilus  2 capsule Oral Daily   amLODipine  10 mg Oral Daily    atorvastatin  40 mg Oral Daily   cloNIDine  0.3 mg Oral TID   clopidogrel  75 mg Oral Q breakfast   enoxaparin (LOVENOX) injection  0.5 mg/kg Subcutaneous Q24H   furosemide  20 mg Oral BID   insulin aspart  0-15 Units Subcutaneous TID WC   insulin aspart  0-5 Units Subcutaneous QHS   insulin glargine-yfgn  8 Units Subcutaneous QHS   irbesartan  150 mg Oral Daily   levothyroxine  112 mcg Oral Q0600   metoprolol succinate  50 mg Oral Q breakfast   sodium chloride flush  3 mL Intravenous Q12H    Assessment/Plan:  Sepsis, present on admission with bilateral lower extremity cellulitis, tachycardia, leukocytosis.  Patient completed antibiotic course on 05/04/2021.  Right lower extremity angiogram on 04/27/2021 and left lower extremity angiogram on 04/30/2021.  Extensive debridement by podiatry on 05/03/2021.  Patient on wound VAC on left heel.  Patient is nonweightbearing on left heel and right toes.  Will need rehab.  Unsafe discharge home. Sacral decubiti, stage II, present on admission.  See full description below Acute kidney injury on chronic kidney disease stage IIIa.  Creatinine peaked at 1.52 on 05/04/2021 and down to 1.11 on 05/15/21 Benign positional vertigo on meclizine Hypothyroidism unspecified on Synthroid Type  2 diabetes mellitus with hyperlipidemia on atorvastatin and glargine insulin with sliding scale Essential hypertension.  On irbesartan, metoprolol, clonidine, amlodipine and Lasix Morbid obesity with a BMI of 43.47  Pressure Injury 04/17/21 Sacrum Medial Stage 2 -  Partial thickness loss of dermis presenting as a shallow open injury with a red, pink wound bed without slough. (Active)  04/17/21 0444  Location: Sacrum  Location Orientation: Medial  Staging: Stage 2 -  Partial thickness loss of dermis presenting as a shallow open injury with a red, pink wound bed without slough.  Wound Description (Comments):   Present on Admission: Yes     Pressure Injury 04/17/21 Heel  Left;Posterior Unstageable - Full thickness tissue loss in which the base of the injury is covered by slough (yellow, tan, gray, green or brown) and/or eschar (tan, brown or black) in the wound bed. (Active)  04/17/21   Location: Heel  Location Orientation: Left;Posterior  Staging: Unstageable - Full thickness tissue loss in which the base of the injury is covered by slough (yellow, tan, gray, green or brown) and/or eschar (tan, brown or black) in the wound bed.  Wound Description (Comments):   Present on Admission: Yes       Code Status:     Code Status Orders  (From admission, onward)           Start     Ordered   04/17/21 0241  Full code  Continuous        04/17/21 0256           Code Status History     Date Active Date Inactive Code Status Order ID Comments User Context   05/28/2015 1519 06/01/2015 1929 Full Code [id number]  Hessie Knows, MD Inpatient      Family Communication: Declined Disposition Plan: Status is: Inpatient.  Medically stable for discharge awaiting insurance authorization for rehab  Time spent: 27 minutes  [place]

## 2021-05-17 DIAGNOSIS — E039 Hypothyroidism, unspecified: Secondary | ICD-10-CM

## 2021-05-17 DIAGNOSIS — R42 Dizziness and giddiness: Secondary | ICD-10-CM

## 2021-05-17 LAB — GLUCOSE, CAPILLARY
Glucose-Capillary: 147 mg/dL — ABNORMAL HIGH (ref 70–99)
Glucose-Capillary: 134 mg/dL — ABNORMAL HIGH (ref 70–99)
Glucose-Capillary: 168 mg/dL — ABNORMAL HIGH (ref 70–99)
Glucose-Capillary: 134 mg/dL — ABNORMAL HIGH (ref 70–99)

## 2021-05-17 LAB — RESP PANEL BY RT-PCR (FLU A&B, COVID) ARPGX2
Influenza A by PCR: NEGATIVE
Influenza B by PCR: NEGATIVE
SARS Coronavirus 2 by RT PCR: NEGATIVE

## 2021-05-17 MED ORDER — METOPROLOL SUCCINATE ER 25 MG PO TB24
12.5000 mg | ORAL_TABLET | Freq: Every day | ORAL | Status: DC
  Administered 2021-05-18: 12.5 mg via ORAL
  Filled 2021-05-17: qty 1

## 2021-05-17 NOTE — Care Management Important Message (Signed)
Important Message  Patient Details  Name: [name] MRN: [id number] Date of Birth: May 04, 1956   Medicare Important Message Given:  Yes     Dannette Barbara 05/17/2021, 5:04 PM

## 2021-05-17 NOTE — Progress Notes (Signed)
Patient ID: [name], female   DOB: Jan 10, 1956, 65 y.o.   MRN: [id number] [facility] PROGRESS NOTE  [name] IZT:[id number] DOB: Jun 29, 1955 DOA: 04/16/2021 PCP: Juline Patch, MD  HPI/Subjective: Awaiting insurance authorization to go out to rehab.  Unsafe discharge home at this point.  Patient feels okay.  She is nervous how she is going to be able to go to the bathroom at rehab.  Objective: Vitals:   05/17/21 0900 05/17/21 1527  BP: (!) 138/57 (!) 129/54  Pulse: (!) 55 (!) 43  Resp: 16 16  Temp: 98 F (36.7 C) (!) 97.5 F (36.4 C)  SpO2: 98% 93%    Intake/Output Summary (Last 24 hours) at 05/17/2021 1535 Last data filed at 05/17/2021 1413 Gross per 24 hour  Intake 200 ml  Output 1400 ml  Net -1200 ml   Filed Weights   04/16/21 2244 04/17/21 0403 05/03/21 1640  Weight: 122.5 kg 118.5 kg 118.5 kg    ROS: Review of Systems  Respiratory:  Negative for shortness of breath.   Cardiovascular:  Negative for chest pain.  Gastrointestinal:  Negative for abdominal pain, nausea and vomiting.  Exam: Physical Exam HENT:     Head: Normocephalic.     Mouth/Throat:     Pharynx: No oropharyngeal exudate.  Eyes:     General: Lids are normal.     Conjunctiva/sclera: Conjunctivae normal.  Cardiovascular:     Rate and Rhythm: Normal rate and regular rhythm.     Heart sounds: Normal heart sounds, S1 normal and S2 normal.  Pulmonary:     Breath sounds: No decreased breath sounds, wheezing, rhonchi or rales.  Abdominal:     Palpations: Abdomen is soft.     Tenderness: There is no abdominal tenderness.  Musculoskeletal:     Right lower leg: Swelling present.     Left lower leg: Swelling present.  Skin:    General: Skin is warm.     Comments: Lower extremities covered.  Wound VAC left heel.  Neurological:     Mental Status: She is alert and oriented to person, place, and time.      Scheduled Meds:  acidophilus  2 capsule Oral Daily   amLODipine  10 mg  Oral Daily   atorvastatin  40 mg Oral Daily   cloNIDine  0.3 mg Oral TID   clopidogrel  75 mg Oral Q breakfast   enoxaparin (LOVENOX) injection  0.5 mg/kg Subcutaneous Q24H   furosemide  20 mg Oral BID   insulin aspart  0-15 Units Subcutaneous TID WC   insulin aspart  0-5 Units Subcutaneous QHS   insulin glargine-yfgn  8 Units Subcutaneous QHS   irbesartan  150 mg Oral Daily   levothyroxine  112 mcg Oral Q0600   metoprolol succinate  50 mg Oral Q breakfast   sodium chloride flush  3 mL Intravenous Q12H     Assessment/Plan:  Sepsis, present on admission with bilateral lower extremity cellulitis, tachycardia and leukocytosis.  Patient completed antibiotic course during the hospital stay on 05/04/2021.  Patient had right lower extremity angiogram on 04/27/2021 and left lower extremity angiogram on 04/30/2021.  Patient also had extensive debridement by podiatry on 05/03/2021.  Patient has a wound VAC on her left heel.  Patient is nonweightbearing on left heel and right toes.  Will end up needing rehab.  Received insurance authorization this afternoon.  We will send off COVID swab.  Facility will take tomorrow. Sacral decubiti, stage II, present on admission.  See full description below Acute kidney injury on chronic kidney disease stage IIIa.  Creatinine peaked at 1.52 on 05/04/2021 and down to 1.1 on 05/15/2021. Benign positional vertigo on as needed meclizine Hypothyroidism unspecified on Synthroid Type 2 diabetes mellitus with hyperlipidemia.  Continue atorvastatin.  Patient on glargine insulin with sliding scale. Essential hypertension and bradycardia.  Cut back on metoprolol dose for tomorrow.  May end up having to cut back on clonidine also.  Continue irbesartan, clonidine, amlodipine and Lasix Morbid obesity with a BMI of 43.47 Peripheral vascular disease on Plavix.  Pressure Injury 04/17/21 Sacrum Medial Stage 2 -  Partial thickness loss of dermis presenting as a shallow open injury with a  red, pink wound bed without slough. (Active)  04/17/21 0444  Location: Sacrum  Location Orientation: Medial  Staging: Stage 2 -  Partial thickness loss of dermis presenting as a shallow open injury with a red, pink wound bed without slough.  Wound Description (Comments):   Present on Admission: Yes     Pressure Injury 04/17/21 Heel Left;Posterior Unstageable - Full thickness tissue loss in which the base of the injury is covered by slough (yellow, tan, gray, green or brown) and/or eschar (tan, brown or black) in the wound bed. (Active)  04/17/21   Location: Heel  Location Orientation: Left;Posterior  Staging: Unstageable - Full thickness tissue loss in which the base of the injury is covered by slough (yellow, tan, gray, green or brown) and/or eschar (tan, brown or black) in the wound bed.  Wound Description (Comments):   Present on Admission: Yes       Code Status:     Code Status Orders  (From admission, onward)           Start     Ordered   04/17/21 0241  Full code  Continuous        04/17/21 0256           Code Status History     Date Active Date Inactive Code Status Order ID Comments User Context   05/28/2015 1519 06/01/2015 1929 Full Code [id number]  Hessie Knows, MD Inpatient      Disposition Plan: Status is: Inpatient.  He was [employer].  Facility will take tomorrow when wound VAC arrives.  Send COVID test today.  Time spent: 26 minutes  [place]

## 2021-05-17 NOTE — TOC Progression Note (Signed)
Transition of Care [facility]) - Progression Note    Patient Details  Name: [name] MRN: [id number] Date of Birth: [birth year]  [facility]) CM/SW Contact  Beverly Sessions, RN Phone Number: 05/17/2021, 12:50 PM  Clinical Narrative:     Reached out to Hilda Blades at [facility] for update on auth.  Awaiting response    Expected Discharge Plan: [place] Barriers to Discharge: Continued Medical Work up  Expected Discharge Plan and Services Expected Discharge Plan: [place] arrangements for the past 2 months: Single Family Home                                       Social Determinants of Health (SDOH) Interventions    Readmission Risk Interventions No flowsheet data found.

## 2021-05-17 NOTE — TOC Progression Note (Addendum)
Transition of Care [facility]) - Progression Note    Patient Details  Name: [name] MRN: [id number] Date of Birth: 06/27/1956  [facility]) CM/SW Contact  Beverly Sessions, RN Phone Number: 05/17/2021, 3:36 PM  Clinical Narrative:      Josem Kaufmann still pending.  Per Hilda Blades at [facility], wound vac will be at the facility tomorrow    Update:  auth received.  Per Hilda Blades patient can admit tomorrow.  MD to order covid test   Expected Discharge Plan: Skilled Nursing Facility Barriers to Discharge: Continued Medical Work up  Expected Discharge Plan and Services Expected Discharge Plan: [place] arrangements for the past 2 months: Single Family Home                                       Social Determinants of Health (SDOH) Interventions    Readmission Risk Interventions No flowsheet data found.

## 2021-05-18 DIAGNOSIS — Z743 Need for continuous supervision: Secondary | ICD-10-CM | POA: Diagnosis not present

## 2021-05-18 DIAGNOSIS — N189 Chronic kidney disease, unspecified: Secondary | ICD-10-CM | POA: Diagnosis not present

## 2021-05-18 DIAGNOSIS — L03119 Cellulitis of unspecified part of limb: Secondary | ICD-10-CM | POA: Diagnosis not present

## 2021-05-18 DIAGNOSIS — I70209 Unspecified atherosclerosis of native arteries of extremities, unspecified extremity: Secondary | ICD-10-CM | POA: Diagnosis not present

## 2021-05-18 DIAGNOSIS — M6281 Muscle weakness (generalized): Secondary | ICD-10-CM | POA: Diagnosis not present

## 2021-05-18 DIAGNOSIS — I110 Hypertensive heart disease with heart failure: Secondary | ICD-10-CM | POA: Diagnosis not present

## 2021-05-18 DIAGNOSIS — R52 Pain, unspecified: Secondary | ICD-10-CM | POA: Diagnosis not present

## 2021-05-18 DIAGNOSIS — E1169 Type 2 diabetes mellitus with other specified complication: Secondary | ICD-10-CM | POA: Diagnosis not present

## 2021-05-18 DIAGNOSIS — R42 Dizziness and giddiness: Secondary | ICD-10-CM | POA: Diagnosis not present

## 2021-05-18 DIAGNOSIS — I739 Peripheral vascular disease, unspecified: Secondary | ICD-10-CM | POA: Diagnosis not present

## 2021-05-18 DIAGNOSIS — E1151 Type 2 diabetes mellitus with diabetic peripheral angiopathy without gangrene: Secondary | ICD-10-CM | POA: Diagnosis not present

## 2021-05-18 DIAGNOSIS — N179 Acute kidney failure, unspecified: Secondary | ICD-10-CM | POA: Diagnosis not present

## 2021-05-18 DIAGNOSIS — Z95820 Peripheral vascular angioplasty status with implants and grafts: Secondary | ICD-10-CM | POA: Diagnosis not present

## 2021-05-18 DIAGNOSIS — R5381 Other malaise: Secondary | ICD-10-CM | POA: Diagnosis not present

## 2021-05-18 DIAGNOSIS — L0390 Cellulitis, unspecified: Secondary | ICD-10-CM | POA: Diagnosis not present

## 2021-05-18 DIAGNOSIS — Z7401 Bed confinement status: Secondary | ICD-10-CM | POA: Diagnosis not present

## 2021-05-18 DIAGNOSIS — R69 Illness, unspecified: Secondary | ICD-10-CM | POA: Diagnosis not present

## 2021-05-18 DIAGNOSIS — R531 Weakness: Secondary | ICD-10-CM | POA: Diagnosis not present

## 2021-05-18 DIAGNOSIS — A419 Sepsis, unspecified organism: Secondary | ICD-10-CM | POA: Diagnosis not present

## 2021-05-18 DIAGNOSIS — L97523 Non-pressure chronic ulcer of other part of left foot with necrosis of muscle: Secondary | ICD-10-CM | POA: Diagnosis not present

## 2021-05-18 DIAGNOSIS — E785 Hyperlipidemia, unspecified: Secondary | ICD-10-CM | POA: Diagnosis not present

## 2021-05-18 DIAGNOSIS — E1142 Type 2 diabetes mellitus with diabetic polyneuropathy: Secondary | ICD-10-CM | POA: Diagnosis not present

## 2021-05-18 DIAGNOSIS — I10 Essential (primary) hypertension: Secondary | ICD-10-CM | POA: Diagnosis not present

## 2021-05-18 DIAGNOSIS — L89152 Pressure ulcer of sacral region, stage 2: Secondary | ICD-10-CM | POA: Diagnosis not present

## 2021-05-18 DIAGNOSIS — E039 Hypothyroidism, unspecified: Secondary | ICD-10-CM | POA: Diagnosis not present

## 2021-05-18 LAB — BASIC METABOLIC PANEL
Anion gap: 9 (ref 5–15)
BUN: 12 mg/dL (ref 8–23)
CO2: 31 mmol/L (ref 22–32)
Calcium: 8.1 mg/dL — ABNORMAL LOW (ref 8.9–10.3)
Chloride: 97 mmol/L — ABNORMAL LOW (ref 98–111)
Creatinine, Ser: 1.21 mg/dL — ABNORMAL HIGH (ref 0.44–1.00)
GFR, Estimated: 49 mL/min — ABNORMAL LOW (ref >60)
Glucose, Bld: 137 mg/dL — ABNORMAL HIGH (ref 70–99)
Potassium: 3 mmol/L — ABNORMAL LOW (ref 3.5–5.1)
Sodium: 137 mmol/L (ref 135–145)

## 2021-05-18 LAB — CBC
HCT: 32.7 % — ABNORMAL LOW (ref 36.0–46.0)
Hemoglobin: 10.3 g/dL — ABNORMAL LOW (ref 12.0–15.0)
MCH: 29.3 pg (ref 26.0–34.0)
MCHC: 31.5 g/dL (ref 30.0–36.0)
MCV: 92.9 fL (ref 80.0–100.0)
Platelets: 287 10*3/uL (ref 150–400)
RBC: 3.52 MIL/uL — ABNORMAL LOW (ref 3.87–5.11)
RDW: 14.6 % (ref 11.5–15.5)
WBC: 5.2 10*3/uL (ref 4.0–10.5)
nRBC: 0 % (ref 0.0–0.2)

## 2021-05-18 LAB — GLUCOSE, CAPILLARY
Glucose-Capillary: 120 mg/dL — ABNORMAL HIGH (ref 70–99)
Glucose-Capillary: 169 mg/dL — ABNORMAL HIGH (ref 70–99)

## 2021-05-18 LAB — MAGNESIUM: Magnesium: 1.8 mg/dL (ref 1.7–2.4)

## 2021-05-18 MED ORDER — POTASSIUM CHLORIDE CRYS ER 20 MEQ PO TBCR: 40.0000 meq | EXTENDED_RELEASE_TABLET | Freq: Every day | ORAL | 0 refills | Status: DC

## 2021-05-18 MED ORDER — IRBESARTAN 150 MG PO TABS: 150.0000 mg | ORAL_TABLET | Freq: Every day | ORAL | 0 refills | Status: DC

## 2021-05-18 MED ORDER — MAGNESIUM OXIDE -MG SUPPLEMENT 400 (240 MG) MG PO TABS
400.0000 mg | ORAL_TABLET | Freq: Every day | ORAL | Status: DC
  Administered 2021-05-18: 400 mg via ORAL
  Filled 2021-05-18: qty 1

## 2021-05-18 MED ORDER — OXYCODONE HCL 5 MG PO TABS: 5.0000 mg | ORAL_TABLET | Freq: Four times a day (QID) | ORAL | 0 refills | Status: DC | PRN

## 2021-05-18 MED ORDER — ENOXAPARIN SODIUM 60 MG/0.6ML IJ SOSY: 0.5000 mg/kg | PREFILLED_SYRINGE | INTRAMUSCULAR | 0 refills | Status: DC

## 2021-05-18 MED ORDER — OXYCODONE HCL 5 MG PO TABS: 5.0000 mg | ORAL_TABLET | Freq: Four times a day (QID) | ORAL | Status: DC | PRN

## 2021-05-18 MED ORDER — METOPROLOL SUCCINATE ER 25 MG PO TB24: 12.5000 mg | ORAL_TABLET | Freq: Every day | ORAL | 0 refills | Status: AC

## 2021-05-18 MED ORDER — INSULIN ASPART 100 UNIT/ML IJ SOLN: 3.0000 [IU] | Freq: Three times a day (TID) | INTRAMUSCULAR | 0 refills | Status: AC

## 2021-05-18 MED ORDER — AMLODIPINE BESYLATE 10 MG PO TABS: 10.0000 mg | ORAL_TABLET | Freq: Every day | ORAL | 0 refills | Status: DC

## 2021-05-18 MED ORDER — POTASSIUM CHLORIDE CRYS ER 20 MEQ PO TBCR
40.0000 meq | EXTENDED_RELEASE_TABLET | Freq: Every day | ORAL | Status: DC
  Administered 2021-05-18: 40 meq via ORAL
  Filled 2021-05-18: qty 2

## 2021-05-18 MED ORDER — HYDROXYZINE HCL 10 MG PO TABS: 10.0000 mg | ORAL_TABLET | Freq: Three times a day (TID) | ORAL | 0 refills | Status: DC | PRN

## 2021-05-18 MED ORDER — INSULIN GLARGINE-YFGN 100 UNIT/ML ~~LOC~~ SOLN: 8.0000 [IU] | Freq: Every day | SUBCUTANEOUS | 0 refills | Status: DC

## 2021-05-18 MED ORDER — CLOPIDOGREL BISULFATE 75 MG PO TABS: 75.0000 mg | ORAL_TABLET | Freq: Every day | ORAL | 0 refills | Status: AC

## 2021-05-18 MED ORDER — FUROSEMIDE 20 MG PO TABS: 20.0000 mg | ORAL_TABLET | Freq: Two times a day (BID) | ORAL | 0 refills | Status: DC

## 2021-05-18 MED ORDER — RISAQUAD PO CAPS: 2.0000 | ORAL_CAPSULE | Freq: Every day | ORAL | 0 refills | Status: AC

## 2021-05-18 MED ORDER — ACETAMINOPHEN 325 MG PO TABS: 650.0000 mg | ORAL_TABLET | Freq: Four times a day (QID) | ORAL | Status: AC | PRN

## 2021-05-18 MED ORDER — MECLIZINE HCL 25 MG PO TABS
25.0000 mg | ORAL_TABLET | Freq: Three times a day (TID) | ORAL | 0 refills | Status: DC | PRN
Start: 2021-05-18 — End: 2021-10-30

## 2021-05-18 MED ORDER — MAGNESIUM OXIDE -MG SUPPLEMENT 400 (240 MG) MG PO TABS: 400.0000 mg | ORAL_TABLET | Freq: Every day | ORAL | 0 refills | Status: DC

## 2021-05-18 NOTE — TOC Transition Note (Signed)
Transition of Care [facility]) - CM/SW Discharge Note   Patient Details  Name: [name] MRN: [id number] Date of Birth: [birth year]  Transition of Care [facility]) CM/SW Contact:  Beverly Sessions, RN Phone Number: 05/18/2021, 12:24 PM   Clinical Narrative:    Patient will DC to:[place] Anticipated DC date:05/18/21  Family notified:Patient states she will update her friend  Transport by:EMS  Per MD patient ready for DC to . RN, patient, patient's, and facility notified of DC. Discharge Summary sent to facility. RN given number for report. DC packet on chart. Ambulance transport requested for patient.  TOC signing off.  Isaias Cowman [facility] [phone]      Barriers to Discharge: Continued Medical Work up   Patient Goals and CMS Choice        Discharge Placement                       Discharge Plan and Services                                     Social Determinants of Health (SDOH) Interventions     Readmission Risk Interventions No flowsheet data found.

## 2021-05-18 NOTE — Discharge Summary (Addendum)
[facility] [place] [facility]   PATIENT NAME: [name]    MR#:  [id number]  DATE OF BIRTH:  [birth year]  DATE OF ADMISSION:  04/16/2021 ADMITTING PHYSICIAN: Hannah Beat, MD  DATE OF DISCHARGE: 05/18/2021  PRIMARY CARE PHYSICIAN: Duanne Limerick, MD    ADMISSION DIAGNOSIS:  Pain [R52] AKI (acute kidney injury) (HCC) [N17.9] Cellulitis of lower extremity, unspecified laterality [L03.119] Sepsis due to cellulitis (HCC) [L03.90, A41.9] Sepsis, due to unspecified organism, unspecified whether acute organ dysfunction present (HCC) [A41.9]  DISCHARGE DIAGNOSIS:  Principal Problem:   Adjustment disorder with anxiety Active Problems:   Type 2 diabetes mellitus with hyperlipidemia (HCC)   Sepsis (HCC)   Pressure injury of skin   Left leg pain   Sepsis without acute organ dysfunction (HCC)   Acute kidney injury superimposed on CKD (HCC)   Vertigo   PVD (peripheral vascular disease) (HCC)   SECONDARY DIAGNOSIS:   Past Medical History:  Diagnosis Date  . Diabetes mellitus without complication (HCC) 2010  . Hypertension   . Hypothyroidism   . Kidney stone 11/2014   history of/STAGE 4 KIDNEY DISEASE PER DR Wynelle Link  . Lymphedema   . Multiple open wounds of lower leg    PT BEING SEEN BY THE WOUND CENTER FOR CHRONIC BLISTERS PER PT    HOSPITAL COURSE:   Sepsis, present on admission bilateral lower extremity cellulitis, tachycardia and leukocytosis.  The patient ended up completing her antibiotic course during the hospital stay on 05/04/2021.  Patient had extensive debridement by podiatry on 05/03/2021.  Patient has a wound VAC on her left heel.  This needs to be changed 3 times a week.  For right leg dry dressing change right fifth toe and great toe wounds 3 times a week and cover with Ace wrap.  For left lower extremity apply Xeroform padded gauze bandage to left leg 3 times a week and cover with Ace bandage.  Wound VAC left heel.  Patient is heel touch on  right foot and toe touch on left foot.  Activity as tolerated.  Sutures on right foot can stay in until seen by podiatry as outpatient. Peripheral vascular disease.  Patient on Plavix.  Patient had right lower extremity angiogram on 04/27/2021.  Patient had left lower extremity angiogram on 04/30/2021.  Please see operative reports.  Follow-up with vascular surgery as outpatient. Sacral decubiti, stage II, present on admission.  Partial thickness loss of dermis presenting with pink open wound without slough. Acute kidney injury on chronic kidney disease stage IIIa.  Creatinine peaked at 1.52 and down to 1.21 upon discharge. Hypothyroidism unspecified on Synthroid Benign positional vertigo on as needed meclizine Type 2 diabetes mellitus with hyperlipidemia on atorvastatin.  Patient on glargine insulin with short acting insulin prior to meals.  Check fingersticks before every meal and nightly. Essential hypertension and bradycardia.  I cut back on the metoprolol dose to 12.5 mg daily.  Patient also on high-dose clonidine.  Continue irbesartan, amlodipine and Lasix Morbid obesity with a BMI of 43.47 Hypokalemia replace potassium for 3 days and recheck BMP and magnesium in 1 week. DVT prophylaxis with Lovenox until more ambulatory.  Check CBC weekly while on this.  DISCHARGE CONDITIONS:   Fair  CONSULTS OBTAINED:  Treatment Team:  Audery Amel, MD Podiatry Vascular surgery  DRUG ALLERGIES:  No Known Allergies  DISCHARGE MEDICATIONS:   Allergies as of 05/18/2021   No Known Allergies      Medication List  STOP taking these medications    aspirin EC 81 MG tablet   gabapentin 300 MG capsule Commonly known as: Neurontin   glipiZIDE 5 MG tablet Commonly known as: GLUCOTROL   telmisartan 80 MG tablet Commonly known as: MICARDIS Replaced by: irbesartan 150 MG tablet       TAKE these medications    acetaminophen 325 MG tablet Commonly known as: TYLENOL Take 2 tablets  (650 mg total) by mouth every 6 (six) hours as needed for mild pain (or Fever >/= 101). What changed:  medication strength how much to take reasons to take this   acidophilus Caps capsule Take 2 capsules by mouth daily for 15 days. Start taking on: May 19, 2021   amLODipine 10 MG tablet Commonly known as: NORVASC Take 1 tablet (10 mg total) by mouth daily.   atorvastatin 20 MG tablet Commonly known as: LIPITOR Take 1 tablet (20 mg total) by mouth daily.   blood glucose meter kit and supplies 1 each by Other route daily. ONE TOUCH ULTRA METER. Use as directed. E11.29   cloNIDine 0.3 MG tablet Commonly known as: CATAPRES TAKE ONE TABLET BY MOUTH 3 TIMES DAILY.   clopidogrel 75 MG tablet Commonly known as: PLAVIX Take 1 tablet (75 mg total) by mouth daily with breakfast. Start taking on: May 19, 2021   enoxaparin 60 MG/0.6ML injection Commonly known as: LOVENOX Inject 0.6 mLs (60 mg total) into the skin daily.   furosemide 20 MG tablet Commonly known as: LASIX Take 1 tablet (20 mg total) by mouth 2 (two) times daily. What changed:  medication strength how much to take   glucose blood test strip 1 each by Other route daily. ONE TOUCH ULTRA TEST STRIPS E11.29   hydrOXYzine 10 MG tablet Commonly known as: ATARAX/VISTARIL Take 1 tablet (10 mg total) by mouth 3 (three) times daily as needed.   insulin aspart 100 UNIT/ML injection Commonly known as: novoLOG Inject 3 Units into the skin 3 (three) times daily with meals.   insulin glargine-yfgn 100 UNIT/ML injection Commonly known as: SEMGLEE Inject 0.08 mLs (8 Units total) into the skin at bedtime.   irbesartan 150 MG tablet Commonly known as: AVAPRO Take 1 tablet (150 mg total) by mouth daily. Start taking on: May 19, 2021 Replaces: telmisartan 80 MG tablet   levothyroxine 112 MCG tablet Commonly known as: SYNTHROID Take 1 tablet (112 mcg total) by mouth daily before breakfast.   magnesium oxide  400 (240 Mg) MG tablet Commonly known as: MAG-OX Take 1 tablet (400 mg total) by mouth daily. Start taking on: May 19, 2021   meclizine 25 MG tablet Commonly known as: ANTIVERT Take 1 tablet (25 mg total) by mouth 3 (three) times daily as needed for dizziness.   metoprolol succinate 25 MG 24 hr tablet Commonly known as: TOPROL-XL Take 0.5 tablets (12.5 mg total) by mouth daily with breakfast. Start taking on: May 19, 2021 What changed:  medication strength how much to take when to take this   onetouch ultrasoft lancets 1 each by Other route daily. Use as instructed E11.29   oxyCODONE 5 MG immediate release tablet Commonly known as: Oxy IR/ROXICODONE Take 1 tablet (5 mg total) by mouth every 6 (six) hours as needed for severe pain.   potassium chloride SA 20 MEQ tablet Commonly known as: KLOR-CON Take 2 tablets (40 mEq total) by mouth daily for 3 days. Start taking on: May 19, 2021  Discharge Care Instructions  (From admission, onward)           Start     Ordered   05/06/21 0000  Change dressing       Comments: Dressing orders. For left foot: Change wound VAC 3 times a week to left heel. For right foot: Dry dressing change to right fifth toe and great toe wounds 3 times a week. For bilateral lower leg wounds: Apply Xeroform and padded gauze bandage to leg wounds 3 times a week.   05/06/21 1243             DISCHARGE INSTRUCTIONS:   Follow-up team at rehab 1 day Follow-up podiatry 2 weeks Follow-up vascular surgery Follow-up wound care center  If you experience worsening of your admission symptoms, develop shortness of breath, life threatening emergency, suicidal or homicidal thoughts you must seek medical attention immediately by calling 911 or calling your MD immediately  if symptoms less severe.  You Must read complete instructions/literature along with all the possible adverse reactions/side effects for all the  Medicines you take and that have been prescribed to you. Take any new Medicines after you have completely understood and accept all the possible adverse reactions/side effects.   Please note  You were cared for by a hospitalist during your hospital stay. If you have any questions about your discharge medications or the care you received while you were in the hospital after you are discharged, you can call the unit and asked to speak with the hospitalist on call if the hospitalist that took care of you is not available. Once you are discharged, your primary care physician will handle any further medical issues. Please note that NO REFILLS for any discharge medications will be authorized once you are discharged, as it is imperative that you return to your primary care physician (or establish a relationship with a primary care physician if you do not have one) for your aftercare needs so that they can reassess your need for medications and monitor your lab values.    Today   CHIEF COMPLAINT:   Chief Complaint  Patient presents with  . Leg Pain  . Foot Pain    HISTORY OF PRESENT ILLNESS:  Calisa Luckenbaugh  is a 65 y.o. female came in with leg and foot pain   VITAL SIGNS:  Blood pressure (!) 152/65, pulse 79, temperature 97.7 F (36.5 C), temperature source Oral, resp. rate 19, height 5\' 5"  (1.651 m), weight 118.5 kg, SpO2 99 %.  I/O:   Intake/Output Summary (Last 24 hours) at 05/18/2021 1137 Last data filed at 05/18/2021 1030 Gross per 24 hour  Intake 480 ml  Output 700 ml  Net -220 ml    PHYSICAL EXAMINATION:  GENERAL:  65 y.o.-year-old patient lying in the bed with no acute distress.  EYES: Pupils equal, round, reactive to light and accommodation. No scleral icterus.  HEENT: Head atraumatic, normocephalic. Oropharynx and nasopharynx clear.   LUNGS: Normal breath sounds bilaterally, no wheezing, rales,rhonchi or crepitation. No use of accessory muscles of respiration.   CARDIOVASCULAR: S1, S2 normal. No murmurs, rubs, or gallops.  ABDOMEN: Soft, non-tender, non-distended.  EXTREMITIES: 2+ pedal edema. No cyanosis, or clubbing.  NEUROLOGIC: Cranial nerves II through XII are intact. Muscle strength 5/5 in all extremities. Sensation intact. Gait not checked.  PSYCHIATRIC: The patient is alert and oriented x 3.  SKIN: No obvious rash, lesion, or ulcer.   DATA REVIEW:   CBC Recent Labs  Lab 05/18/21 0301  WBC 5.2  HGB 10.3*  HCT 32.7*  PLT 287    Chemistries  Recent Labs  Lab 05/18/21 0301  NA 137  K 3.0*  CL 97*  CO2 31  GLUCOSE 137*  BUN 12  CREATININE 1.21*  CALCIUM 8.1*  MG 1.8     Microbiology Results  Results for orders placed or performed during the hospital encounter of 04/16/21  Blood culture (routine x 2)     Status: None   Collection Time: 04/17/21 12:19 AM   Specimen: BLOOD  Result Value Ref Range Status   Specimen Description BLOOD RHAND  Final   Special Requests   Final    BOTTLES DRAWN AEROBIC AND ANAEROBIC Blood Culture adequate volume   Culture   Final    NO GROWTH 5 DAYS Performed at [facility], [address] [place], [place] [address]    Report Status 04/22/2021 FINAL  Final  Resp Panel by RT-PCR (Flu A&B, Covid) Nasopharyngeal Swab     Status: None   Collection Time: 04/17/21  1:34 AM   Specimen: Nasopharyngeal Swab; Nasopharyngeal(NP) swabs in vial transport medium  Result Value Ref Range Status   SARS Coronavirus 2 by RT PCR NEGATIVE NEGATIVE Final    Comment: (NOTE) SARS-CoV-2 target nucleic acids are NOT DETECTED.  The SARS-CoV-2 RNA is generally detectable in upper respiratory specimens during the acute phase of infection. The lowest concentration of SARS-CoV-2 viral copies this assay can detect is 138 copies/mL. A negative result does not preclude SARS-Cov-2 infection and should not be used as the sole basis for treatment or other patient management decisions. A negative result may  occur with  improper specimen collection/handling, submission of specimen other than nasopharyngeal swab, presence of viral mutation(s) within the areas targeted by this assay, and inadequate number of viral copies(<138 copies/mL). A negative result must be combined with clinical observations, patient history, and epidemiological information. The expected result is Negative.  Fact Sheet for Patients:  EntrepreneurPulse.com.au  Fact Sheet for Healthcare Providers:  IncredibleEmployment.be  This test is no t yet approved or cleared by the [place] FDA and  has been authorized for detection and/or diagnosis of SARS-CoV-2 by FDA under an Emergency Use Authorization (EUA). This EUA will remain  in effect (meaning this test can be used) for the duration of the COVID-19 declaration under Section 564(b)(1) of the Act, 21 U.S.C.section 360bbb-3(b)(1), unless the authorization is terminated  or revoked sooner.       Influenza A by PCR NEGATIVE NEGATIVE Final   Influenza B by PCR NEGATIVE NEGATIVE Final    Comment: (NOTE) The Xpert Xpress SARS-CoV-2/FLU/RSV plus assay is intended as an aid in the diagnosis of influenza from Nasopharyngeal swab specimens and should not be used as a sole basis for treatment. Nasal washings and aspirates are unacceptable for Xpert Xpress SARS-CoV-2/FLU/RSV testing.  Fact Sheet for Patients: EntrepreneurPulse.com.au  Fact Sheet for Healthcare Providers: IncredibleEmployment.be  This test is not yet approved or cleared by the [place] FDA and has been authorized for detection and/or diagnosis of SARS-CoV-2 by FDA under an Emergency Use Authorization (EUA). This EUA will remain in effect (meaning this test can be used) for the duration of the COVID-19 declaration under Section 564(b)(1) of the Act, 21 U.S.C. section 360bbb-3(b)(1), unless the authorization is terminated  or revoked.  Performed at [facility], [address] [place], [place] [address]   Blood culture (routine x 2)     Status: None   Collection Time:  04/17/21  2:25 AM   Specimen: BLOOD  Result Value Ref Range Status   Specimen Description BLOOD BLOOD LEFT HAND  Final   Special Requests   Final    BOTTLES DRAWN AEROBIC AND ANAEROBIC Blood Culture adequate volume   Culture   Final    NO GROWTH 5 DAYS Performed at [facility], [address], [place], [place] [address]    Report Status 04/22/2021 FINAL  Final  Surgical pcr screen     Status: None   Collection Time: 04/19/21  1:09 PM   Specimen: Nasal Mucosa; Nasal Swab  Result Value Ref Range Status   MRSA, PCR NEGATIVE NEGATIVE Final   Staphylococcus aureus NEGATIVE NEGATIVE Final    Comment: (NOTE) The Xpert SA Assay (FDA approved for NASAL specimens in patients 71 years of age and older), is one component of a comprehensive surveillance program. It is not intended to diagnose infection nor to guide or monitor treatment. Performed at [facility], [address] [place], [place] [address]   Aerobic/Anaerobic Culture w Gram Stain (surgical/deep wound)     Status: None   Collection Time: 04/21/21  6:20 PM   Specimen: Wound  Result Value Ref Range Status   Specimen Description   Final    WOUND Performed at [facility], [address], [place], [place] [address]    Special Requests   Final    NONE Performed at [facility], [address] [place], [place] [address]    Gram Stain NO WBC SEEN NO ORGANISMS SEEN   Final   Culture   Final    ABUNDANT PROTEUS VULGARIS FEW PREVOTELLA SPECIES BETA LACTAMASE POSITIVE Performed at [facility], [place] [address] [place], [place] [address]    Report Status 04/27/2021 FINAL  Final   Organism ID, Bacteria PROTEUS VULGARIS  Final      Susceptibility   Proteus vulgaris - MIC*    AMPICILLIN >=32 RESISTANT Resistant      CEFAZOLIN >=64 RESISTANT Resistant     CEFEPIME <=0.12 SENSITIVE Sensitive     CEFTAZIDIME <=1 SENSITIVE Sensitive     CIPROFLOXACIN <=0.25 SENSITIVE Sensitive     GENTAMICIN <=1 SENSITIVE Sensitive     IMIPENEM 2 SENSITIVE Sensitive     TRIMETH/SULFA <=20 SENSITIVE Sensitive     AMPICILLIN/SULBACTAM 8 SENSITIVE Sensitive     PIP/TAZO <=4 SENSITIVE Sensitive     * ABUNDANT PROTEUS VULGARIS  Resp Panel by RT-PCR (Flu A&B, Covid) Nasopharyngeal Swab     Status: None   Collection Time: 05/11/21  1:19 PM   Specimen: Nasopharyngeal Swab; Nasopharyngeal(NP) swabs in vial transport medium  Result Value Ref Range Status   SARS Coronavirus 2 by RT PCR NEGATIVE NEGATIVE Final    Comment: (NOTE) SARS-CoV-2 target nucleic acids are NOT DETECTED.  The SARS-CoV-2 RNA is generally detectable in upper respiratory specimens during the acute phase of infection. The lowest concentration of SARS-CoV-2 viral copies this assay can detect is 138 copies/mL. A negative result does not preclude SARS-Cov-2 infection and should not be used as the sole basis for treatment or other patient management decisions. A negative result may occur with  improper specimen collection/handling, submission of specimen other than nasopharyngeal swab, presence of viral mutation(s) within the areas targeted by this assay, and inadequate number of viral copies(<138 copies/mL). A negative result must be combined with clinical observations, patient history, and epidemiological information. The expected result is Negative.  Fact Sheet for Patients:  EntrepreneurPulse.com.au  Fact Sheet for Healthcare Providers:  IncredibleEmployment.be  This test is no t yet approved or cleared by the [place] FDA and  has been authorized for detection and/or diagnosis of SARS-CoV-2 by FDA under an Emergency Use Authorization (EUA). This EUA will remain  in effect (meaning this test can be used) for the  duration of the COVID-19 declaration under Section 564(b)(1) of the Act, 21 U.S.C.section 360bbb-3(b)(1), unless the authorization is terminated  or revoked sooner.       Influenza A by PCR NEGATIVE NEGATIVE Final   Influenza B by PCR NEGATIVE NEGATIVE Final    Comment: (NOTE) The Xpert Xpress SARS-CoV-2/FLU/RSV plus assay is intended as an aid in the diagnosis of influenza from Nasopharyngeal swab specimens and should not be used as a sole basis for treatment. Nasal washings and aspirates are unacceptable for Xpert Xpress SARS-CoV-2/FLU/RSV testing.  Fact Sheet for Patients: EntrepreneurPulse.com.au  Fact Sheet for Healthcare Providers: IncredibleEmployment.be  This test is not yet approved or cleared by the [place] FDA and has been authorized for detection and/or diagnosis of SARS-CoV-2 by FDA under an Emergency Use Authorization (EUA). This EUA will remain in effect (meaning this test can be used) for the duration of the COVID-19 declaration under Section 564(b)(1) of the Act, 21 U.S.C. section 360bbb-3(b)(1), unless the authorization is terminated or revoked.  Performed at [facility], [address] [place], [place] [address]   Resp Panel by RT-PCR (Flu A&B, Covid) Nasopharyngeal Swab     Status: None   Collection Time: 05/17/21  3:55 PM   Specimen: Nasopharyngeal Swab; Nasopharyngeal(NP) swabs in vial transport medium  Result Value Ref Range Status   SARS Coronavirus 2 by RT PCR NEGATIVE NEGATIVE Final    Comment: (NOTE) SARS-CoV-2 target nucleic acids are NOT DETECTED.  The SARS-CoV-2 RNA is generally detectable in upper respiratory specimens during the acute phase of infection. The lowest concentration of SARS-CoV-2 viral copies this assay can detect is 138 copies/mL. A negative result does not preclude SARS-Cov-2 infection and should not be used as the sole basis for treatment or other patient management  decisions. A negative result may occur with  improper specimen collection/handling, submission of specimen other than nasopharyngeal swab, presence of viral mutation(s) within the areas targeted by this assay, and inadequate number of viral copies(<138 copies/mL). A negative result must be combined with clinical observations, patient history, and epidemiological information. The expected result is Negative.  Fact Sheet for Patients:  EntrepreneurPulse.com.au  Fact Sheet for Healthcare Providers:  IncredibleEmployment.be  This test is no t yet approved or cleared by the [place] FDA and  has been authorized for detection and/or diagnosis of SARS-CoV-2 by FDA under an Emergency Use Authorization (EUA). This EUA will remain  in effect (meaning this test can be used) for the duration of the COVID-19 declaration under Section 564(b)(1) of the Act, 21 U.S.C.section 360bbb-3(b)(1), unless the authorization is terminated  or revoked sooner.       Influenza A by PCR NEGATIVE NEGATIVE Final   Influenza B by PCR NEGATIVE NEGATIVE Final    Comment: (NOTE) The Xpert Xpress SARS-CoV-2/FLU/RSV plus assay is intended as an aid in the diagnosis of influenza from Nasopharyngeal swab specimens and should not be used as a sole basis for treatment. Nasal washings and aspirates are unacceptable for Xpert Xpress SARS-CoV-2/FLU/RSV testing.  Fact Sheet for Patients: EntrepreneurPulse.com.au  Fact Sheet for Healthcare Providers: IncredibleEmployment.be  This test is not yet approved or cleared by the [place]  FDA and has been authorized for detection and/or diagnosis of SARS-CoV-2 by FDA under an Emergency Use Authorization (EUA). This EUA will remain in effect (meaning this test can be used) for the duration of the COVID-19 declaration under Section 564(b)(1) of the Act, 21 U.S.C. section 360bbb-3(b)(1), unless the  authorization is terminated or revoked.  Performed at [facility], [address] [place], [place] [address]       Management plans discussed with the patient, and she is in agreement.  CODE STATUS:     Code Status Orders  (From admission, onward)           Start     Ordered   04/17/21 0241  Full code  Continuous        04/17/21 0256           Code Status History     Date Active Date Inactive Code Status Order ID Comments User Context   05/28/2015 1519 06/01/2015 1929 Full Code [id number]  Hessie Knows, MD Inpatient       TOTAL TIME TAKING CARE OF THIS PATIENT: 32 minutes.    Loletha Grayer M.D on 05/18/2021 at 11:37 AM   [facility]  CC: Primary care physician; Juline Patch, MD

## 2021-05-18 NOTE — Progress Notes (Addendum)
Discharge instruction given to patient, all questions answered. IV removed. Report will be called to [facility] where pt. Will discharge. All belongings gathered, wound vac removed and wet to dry dressing in place. Facility will place wound vac to pt. Left foot on arrival. Report called and given to [place]. Nurse at facility.

## 2021-05-18 NOTE — Plan of Care (Signed)
  Problem: Education: Goal: Knowledge of General Education information will improve Description: Including pain rating scale, medication(s)/side effects and non-pharmacologic comfort measures Outcome: Adequate for Discharge   Problem: Health Behavior/Discharge Planning: Goal: Ability to manage health-related needs will improve Outcome: Adequate for Discharge   Problem: Clinical Measurements: Goal: Ability to maintain clinical measurements within normal limits will improve Outcome: Adequate for Discharge Goal: Will remain free from infection Outcome: Adequate for Discharge Goal: Diagnostic test results will improve Outcome: Adequate for Discharge Goal: Respiratory complications will improve Outcome: Adequate for Discharge Goal: Cardiovascular complication will be avoided Outcome: Adequate for Discharge   Problem: Activity: Goal: Risk for activity intolerance will decrease Outcome: Adequate for Discharge   Problem: Nutrition: Goal: Adequate nutrition will be maintained Outcome: Adequate for Discharge   Problem: Coping: Goal: Level of anxiety will decrease Outcome: Adequate for Discharge   Problem: Elimination: Goal: Will not experience complications related to bowel motility Outcome: Adequate for Discharge Goal: Will not experience complications related to urinary retention Outcome: Adequate for Discharge   Problem: Elimination: Goal: Will not experience complications related to urinary retention Outcome: Adequate for Discharge   Problem: Pain Managment: Goal: General experience of comfort will improve Outcome: Adequate for Discharge   Problem: Safety: Goal: Ability to remain free from injury will improve Outcome: Adequate for Discharge   Problem: Skin Integrity: Goal: Risk for impaired skin integrity will decrease Outcome: Adequate for Discharge   Problem: Clinical Measurements: Goal: Ability to avoid or minimize complications of infection will improve Outcome:  Adequate for Discharge   Problem: Skin Integrity: Goal: Skin integrity will improve Outcome: Adequate for Discharge

## 2021-05-19 DIAGNOSIS — R52 Pain, unspecified: Secondary | ICD-10-CM | POA: Diagnosis not present

## 2021-05-19 DIAGNOSIS — L0390 Cellulitis, unspecified: Secondary | ICD-10-CM | POA: Diagnosis not present

## 2021-05-19 DIAGNOSIS — L03119 Cellulitis of unspecified part of limb: Secondary | ICD-10-CM | POA: Diagnosis not present

## 2021-05-19 DIAGNOSIS — N179 Acute kidney failure, unspecified: Secondary | ICD-10-CM | POA: Diagnosis not present

## 2021-05-19 DIAGNOSIS — E1151 Type 2 diabetes mellitus with diabetic peripheral angiopathy without gangrene: Secondary | ICD-10-CM | POA: Diagnosis not present

## 2021-05-24 ENCOUNTER — Other Ambulatory Visit (INDEPENDENT_AMBULATORY_CARE_PROVIDER_SITE_OTHER): Payer: Self-pay | Admitting: Vascular Surgery

## 2021-05-24 DIAGNOSIS — Z95820 Peripheral vascular angioplasty status with implants and grafts: Secondary | ICD-10-CM

## 2021-05-24 DIAGNOSIS — I739 Peripheral vascular disease, unspecified: Secondary | ICD-10-CM

## 2021-05-25 DIAGNOSIS — I110 Hypertensive heart disease with heart failure: Secondary | ICD-10-CM | POA: Diagnosis not present

## 2021-05-25 DIAGNOSIS — R52 Pain, unspecified: Secondary | ICD-10-CM | POA: Diagnosis not present

## 2021-05-25 DIAGNOSIS — R69 Illness, unspecified: Secondary | ICD-10-CM | POA: Diagnosis not present

## 2021-05-25 DIAGNOSIS — I70209 Unspecified atherosclerosis of native arteries of extremities, unspecified extremity: Secondary | ICD-10-CM | POA: Diagnosis not present

## 2021-05-25 DIAGNOSIS — E1151 Type 2 diabetes mellitus with diabetic peripheral angiopathy without gangrene: Secondary | ICD-10-CM | POA: Diagnosis not present

## 2021-05-26 ENCOUNTER — Other Ambulatory Visit: Payer: Self-pay

## 2021-05-26 ENCOUNTER — Ambulatory Visit (INDEPENDENT_AMBULATORY_CARE_PROVIDER_SITE_OTHER): Payer: Medicare HMO

## 2021-05-26 ENCOUNTER — Ambulatory Visit (INDEPENDENT_AMBULATORY_CARE_PROVIDER_SITE_OTHER): Payer: Medicare HMO | Admitting: Nurse Practitioner

## 2021-05-26 ENCOUNTER — Encounter (INDEPENDENT_AMBULATORY_CARE_PROVIDER_SITE_OTHER): Payer: Self-pay

## 2021-05-26 VITALS — BP 130/77 | HR 53 | Ht 66.0 in | Wt 261.0 lb

## 2021-05-26 DIAGNOSIS — Z95820 Peripheral vascular angioplasty status with implants and grafts: Secondary | ICD-10-CM | POA: Diagnosis not present

## 2021-05-26 DIAGNOSIS — I739 Peripheral vascular disease, unspecified: Secondary | ICD-10-CM

## 2021-05-26 DIAGNOSIS — E785 Hyperlipidemia, unspecified: Secondary | ICD-10-CM | POA: Diagnosis not present

## 2021-05-26 DIAGNOSIS — I10 Essential (primary) hypertension: Secondary | ICD-10-CM

## 2021-05-26 DIAGNOSIS — E1169 Type 2 diabetes mellitus with other specified complication: Secondary | ICD-10-CM

## 2021-05-28 DIAGNOSIS — E1142 Type 2 diabetes mellitus with diabetic polyneuropathy: Secondary | ICD-10-CM | POA: Diagnosis not present

## 2021-05-28 DIAGNOSIS — L97523 Non-pressure chronic ulcer of other part of left foot with necrosis of muscle: Secondary | ICD-10-CM | POA: Diagnosis not present

## 2021-05-31 ENCOUNTER — Encounter (INDEPENDENT_AMBULATORY_CARE_PROVIDER_SITE_OTHER): Payer: Self-pay | Admitting: Nurse Practitioner

## 2021-05-31 NOTE — Progress Notes (Signed)
Subjective:    Patient ID: [name], female    DOB: [birth year], 65 y.o.   MRN: [id number] Chief Complaint  Patient presents with   Follow-up    1 weeek  needs to be sen by 05/26/21 per gs post gs post abi + bil LE may unna     [name] is a 65 year old female that presents today following intervention to the bilateral lower extremities.  The patient initially presented to [facility] due to worsening swelling and worsening of her wounds.  She has been following with the wound center off and on for years due to lower extremity wounds.  She underwent angiogram of the right lower extremity on 04/27/2021 and a left lower extremity on 04/30/2021.  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  Today performing ABIs was technically difficult due to patient being in wheelchair however left lower extremity arterial duplex shows triphasic/biphasic waveforms throughout the lower extremity.  The right lower extremity shows biphasic waveforms in the common femoral artery with monophasic waveforms in the mid SFA whereas there is a biphasic waveform in the mid popliteal artery with a patent stent.  Tibial artery waveforms have monophasic/biphasic waveforms.   Review of Systems  Cardiovascular:  Positive for leg swelling.  Skin:  Positive for wound.  All other systems reviewed and are negative.     Objective:   Physical Exam Vitals reviewed.  HENT:     Head: Normocephalic.  Cardiovascular:     Rate and Rhythm: Normal rate.  Pulmonary:     Effort: Pulmonary effort is normal.  Musculoskeletal:     Right Lower Extremity: (First and fifth toe amputated)    Left Lower Extremity: (Wound VAC on left heel) Skin:    General: Skin is warm and dry.     Capillary Refill: Capillary refill takes 2 to 3 seconds.     Findings: Wound present.  Neurological:     Mental Status:  She is alert and oriented to person, place, and time.     Motor: Weakness present.     Gait: Gait abnormal.  Psychiatric:        Mood and Affect: Mood normal.        Behavior: Behavior normal.        Thought Content: Thought content normal.        Judgment: Judgment normal.    BP 130/77   Pulse (!) 53   Ht $R'5\' 6"'ix$  (1.676 m)   Wt 261 lb (118.4 kg)   BMI 42.13 kg/m   Past Medical History:  Diagnosis Date   Diabetes mellitus without complication ([place]) 4888   Hypertension    Hypothyroidism    Kidney stone 11/2014   history of/STAGE 4 KIDNEY DISEASE PER DR Juleen China   Lymphedema    Multiple open wounds of lower leg    PT BEING SEEN BY THE WOUND CENTER FOR CHRONIC BLISTERS PER PT    Social History   Socioeconomic History   Marital status: Single    Spouse name: Not on file   Number of children: 0   Years of education: Not on file   Highest education level: Not on file  Occupational History   Not on file  Tobacco Use   Smoking status: Never   Smokeless tobacco: Never  Vaping Use   Vaping Use: Never used  Substance and  Sexual Activity   Alcohol use: No   Drug use: No   Sexual activity: Never  Other Topics Concern   Not on file  Social History Narrative   Pt lives alone   Social Determinants of Health   Financial Resource Strain: Low Risk    Difficulty of Paying Living Expenses: Not very hard  Food Insecurity: No Food Insecurity   Worried About Charity fundraiser in the Last Year: Never true   Ran Out of Food in the Last Year: Never true  Transportation Needs: No Transportation Needs   Lack of Transportation (Medical): No   Lack of Transportation (Non-Medical): No  Physical Activity: Inactive   Days of Exercise per Week: 0 days   Minutes of Exercise per Session: 0 min  Stress: No Stress Concern Present   Feeling of Stress : Only a little  Social Connections: Socially Isolated   Frequency of Communication with Friends and Family: More than three times a week    Frequency of Social Gatherings with Friends and Family: Three times a week   Attends Religious Services: Never   Active Member of Clubs or Organizations: No   Attends Archivist Meetings: Never   Marital Status: Never married  Human resources officer Violence: Not At Risk   Fear of Current or Ex-Partner: No   Emotionally Abused: No   Physically Abused: No   Sexually Abused: No    Past Surgical History:  Procedure Laterality Date   AMPUTATION Right 05/03/2021   Procedure: AMPUTATION RAY;  Surgeon: Caroline More, DPM;  Location: [facility];  Service: Podiatry;  Laterality: Right;  RIght Big Toe and Right Fifth Toe   IRRIGATION AND DEBRIDEMENT FOOT Bilateral 05/03/2021   Procedure: IRRIGATION AND DEBRIDEMENT FEET;  Surgeon: Caroline More, DPM;  Location: [facility];  Service: Podiatry;  Laterality: Bilateral;   LOWER EXTREMITY ANGIOGRAPHY Right 04/27/2021   Procedure: Lower Extremity Angiography;  Surgeon: Katha Cabal, MD;  Location: [facility];  Service: Cardiovascular;  Laterality: Right;   LOWER EXTREMITY ANGIOGRAPHY Left 04/30/2021   Procedure: Lower Extremity Angiography;  Surgeon: Algernon Huxley, MD;  Location: [facility];  Service: Cardiovascular;  Laterality: Left;   TOTAL HIP ARTHROPLASTY Right 05/28/2015   Procedure: TOTAL HIP ARTHROPLASTY ANTERIOR APPROACH;  Surgeon: Hessie Knows, MD;  Location: [facility];  Service: Orthopedics;  Laterality: Right;    Family History  Problem Relation Age of Onset   Cancer Mother    Diabetes Father     No Known Allergies  CBC Latest Ref Rng & Units 05/18/2021 05/15/2021 05/13/2021  WBC 4.0 - 10.5 K/uL 5.2 6.2 5.1  Hemoglobin 12.0 - 15.0 g/dL 10.3(L) 10.4(L) 10.4(L)  Hematocrit 36.0 - 46.0 % 32.7(L) 32.9(L) 33.8(L)  Platelets 150 - 400 K/uL 287 275 282      CMP     Component Value Date/Time   NA 137 05/18/2021 0301   NA 137 10/22/2020 1450   NA 139 02/20/2012 0737   K 3.0 (L) 05/18/2021 0301   K 4.7 02/20/2012  0737   CL 97 (L) 05/18/2021 0301   CL 103 02/20/2012 0737   CO2 31 05/18/2021 0301   CO2 27 02/20/2012 0737   GLUCOSE 137 (H) 05/18/2021 0301   GLUCOSE 123 (H) 02/20/2012 0737   BUN 12 05/18/2021 0301   BUN 27 10/22/2020 1450   BUN 26 (H) 02/20/2012 0737   CREATININE 1.21 (H) 05/18/2021 0301   CREATININE 1.31 (H) 02/20/2012 0737   CALCIUM  8.1 (L) 05/18/2021 0301   CALCIUM 9.3 02/20/2012 0737   PROT 8.6 (H) 04/17/2021 0019   PROT 7.8 11/05/2019 1544   PROT 7.8 02/20/2012 0737   ALBUMIN 3.2 (L) 04/17/2021 0019   ALBUMIN 4.0 10/22/2020 1450   ALBUMIN 3.5 02/20/2012 0737   AST 25 04/17/2021 0019   AST 23 02/20/2012 0737   ALT 15 04/17/2021 0019   ALT 22 02/20/2012 0737   ALKPHOS 74 04/17/2021 0019   ALKPHOS 82 02/20/2012 0737   BILITOT 0.9 04/17/2021 0019   BILITOT 0.7 11/05/2019 1544   BILITOT 0.8 02/20/2012 0737   GFRNONAA 49 (L) 05/18/2021 0301   GFRNONAA 45 (L) 02/20/2012 0737   GFRAA 39 (L) 04/30/2020 1408   GFRAA 52 (L) 02/20/2012 0737     No results found.     Assessment & Plan:   1. PAD (peripheral artery disease) (HCC) Recommend:  The patient is status post successful angiogram with intervention.  The patient had a right first and fifth toe amputation done by podiatry as well as intervention to the left heel.  She will continue to have wound care done by podiatry for those interventions.  The patient also has an upcoming visit with the wound care center.  Based on noninvasive studies the patient should have adequate perfusion for wound healing.  The patient will follow-up with repeat noninvasive studies in 3 months or sooner if wounds deteriorate.  2. Essential hypertension Continue antihypertensive medications as already ordered, these medications have been reviewed and there are no changes at this time.   3. Type 2 diabetes mellitus with hyperlipidemia ([place]) Continue hypoglycemic medications as already ordered, these medications have been reviewed and there  are no changes at this time.  Hgb A1C to be monitored as already arranged by primary service    Current Outpatient Medications on File Prior to Visit  Medication Sig Dispense Refill   acetaminophen (TYLENOL) 325 MG tablet Take 2 tablets (650 mg total) by mouth every 6 (six) hours as needed for mild pain (or Fever >/= 101).     acidophilus (RISAQUAD) CAPS capsule Take 2 capsules by mouth daily for 15 days. 30 capsule 0   amLODipine (NORVASC) 10 MG tablet Take 1 tablet (10 mg total) by mouth daily. 30 tablet 0   atorvastatin (LIPITOR) 20 MG tablet Take 1 tablet (20 mg total) by mouth daily. 90 tablet 1   blood glucose meter kit and supplies 1 each by Other route daily. ONE TOUCH ULTRA METER. Use as directed. E11.29 1 each 0   cloNIDine (CATAPRES) 0.3 MG tablet TAKE ONE TABLET BY MOUTH 3 TIMES DAILY. 270 tablet 1   clopidogrel (PLAVIX) 75 MG tablet Take 1 tablet (75 mg total) by mouth daily with breakfast. 30 tablet 0   enoxaparin (LOVENOX) 60 MG/0.6ML injection Inject 0.6 mLs (60 mg total) into the skin daily. 18 mL 0   furosemide (LASIX) 20 MG tablet Take 1 tablet (20 mg total) by mouth 2 (two) times daily. 60 tablet 0   glucose blood test strip 1 each by Other route daily. ONE TOUCH ULTRA TEST STRIPS E11.29 100 strip 3   hydrOXYzine (ATARAX/VISTARIL) 10 MG tablet Take 1 tablet (10 mg total) by mouth 3 (three) times daily as needed. 30 tablet 0   insulin aspart (NOVOLOG) 100 UNIT/ML injection Inject 3 Units into the skin 3 (three) times daily with meals. 10 mL 0   insulin glargine-yfgn (SEMGLEE) 100 UNIT/ML injection Inject 0.08 mLs (8 Units total) into the  skin at bedtime. 10 mL 0   irbesartan (AVAPRO) 150 MG tablet Take 1 tablet (150 mg total) by mouth daily. 30 tablet 0   Lancets (ONETOUCH ULTRASOFT) lancets 1 each by Other route daily. Use as instructed E11.29 100 each 3   levothyroxine (SYNTHROID) 112 MCG tablet Take 1 tablet (112 mcg total) by mouth daily before breakfast. 90 tablet 0    magnesium oxide (MAG-OX) 400 (240 Mg) MG tablet Take 1 tablet (400 mg total) by mouth daily. 30 tablet 0   meclizine (ANTIVERT) 25 MG tablet Take 1 tablet (25 mg total) by mouth 3 (three) times daily as needed for dizziness. 30 tablet 0   metoprolol succinate (TOPROL-XL) 25 MG 24 hr tablet Take 0.5 tablets (12.5 mg total) by mouth daily with breakfast. 15 tablet 0   oxyCODONE (OXY IR/ROXICODONE) 5 MG immediate release tablet Take 1 tablet (5 mg total) by mouth every 6 (six) hours as needed for severe pain. 8 tablet 0   potassium chloride SA (KLOR-CON) 20 MEQ tablet Take 2 tablets (40 mEq total) by mouth daily for 3 days. 6 tablet 0   No current facility-administered medications on file prior to visit.    There are no Patient Instructions on file for this visit. No follow-ups on file.   Kris Hartmann, NP

## 2021-06-03 ENCOUNTER — Encounter: Payer: Medicare HMO | Attending: Physician Assistant | Admitting: Physician Assistant

## 2021-06-03 ENCOUNTER — Other Ambulatory Visit: Payer: Self-pay

## 2021-06-03 DIAGNOSIS — E668 Other obesity: Secondary | ICD-10-CM | POA: Insufficient documentation

## 2021-06-03 DIAGNOSIS — L89893 Pressure ulcer of other site, stage 3: Secondary | ICD-10-CM | POA: Diagnosis not present

## 2021-06-03 DIAGNOSIS — E1140 Type 2 diabetes mellitus with diabetic neuropathy, unspecified: Secondary | ICD-10-CM | POA: Diagnosis not present

## 2021-06-03 DIAGNOSIS — Z6841 Body Mass Index (BMI) 40.0 and over, adult: Secondary | ICD-10-CM | POA: Insufficient documentation

## 2021-06-03 DIAGNOSIS — E038 Other specified hypothyroidism: Secondary | ICD-10-CM | POA: Diagnosis not present

## 2021-06-03 DIAGNOSIS — I10 Essential (primary) hypertension: Secondary | ICD-10-CM | POA: Diagnosis not present

## 2021-06-03 DIAGNOSIS — I120 Hypertensive chronic kidney disease with stage 5 chronic kidney disease or end stage renal disease: Secondary | ICD-10-CM | POA: Diagnosis not present

## 2021-06-03 DIAGNOSIS — I890 Lymphedema, not elsewhere classified: Secondary | ICD-10-CM | POA: Insufficient documentation

## 2021-06-03 DIAGNOSIS — N186 End stage renal disease: Secondary | ICD-10-CM | POA: Diagnosis not present

## 2021-06-03 DIAGNOSIS — E11622 Type 2 diabetes mellitus with other skin ulcer: Secondary | ICD-10-CM | POA: Diagnosis not present

## 2021-06-03 DIAGNOSIS — Z87891 Personal history of nicotine dependence: Secondary | ICD-10-CM | POA: Insufficient documentation

## 2021-06-03 DIAGNOSIS — E1122 Type 2 diabetes mellitus with diabetic chronic kidney disease: Secondary | ICD-10-CM | POA: Diagnosis not present

## 2021-06-03 DIAGNOSIS — L89896 Pressure-induced deep tissue damage of other site: Secondary | ICD-10-CM | POA: Diagnosis not present

## 2021-06-03 NOTE — Progress Notes (Signed)
Autumn, [name] ([id number]) Visit Report for 06/03/2021 Abuse/Suicide Risk Screen Details Patient Name: [name], [name]. Date of Service: 06/03/2021 12:45 PM Medical Record Number: [id number] Patient Account Number: [id number] Date of Birth/Sex: 04-21-1956 (65 y.o. F) Treating RN: Cornell Barman Primary Care Aprille Sawhney: [name] Other Clinician: Referring Rossana Molchan: Caroline More Treating Ewel Lona/Extender: Skipper Cliche in Treatment: 0 Abuse/Suicide Risk Screen Items Answer ABUSE RISK SCREEN: Has anyone close to you tried to hurt or harm you recentlyo No Do you feel uncomfortable with anyone in your familyo No Has anyone forced you do things that you didnot want to doo No Electronic Signature(s) Signed: 06/03/2021 5:30:12 PM By: Gretta Cool, BSN, RN, CWS, Kim RN, BSN Entered By: Gretta Cool, BSN, RN, CWS, Kim on 06/03/2021 14:06:59 [name] ([id number]) -------------------------------------------------------------------------------- Activities of Daily Living Details Patient Name: [name]. Date of Service: 06/03/2021 12:45 PM Medical Record Number: [id number] Patient Account Number: [id number] Date of Birth/Sex: 05/14/1956 (65 y.o. F) Treating RN: Cornell Barman Primary Care Meyer Dockery: [name] Other Clinician: Referring Leighton Luster: Caroline More Treating Tuan Tippin/Extender: Skipper Cliche in Treatment: 0 Activities of Daily Living Items Answer Activities of Daily Living (Please select one for each item) Drive Automobile Need Assistance Take Medications Need Assistance Use Telephone Need Assistance Care for Appearance Need Assistance Use Toilet Need Assistance Bath / Shower Need Assistance Dress Self Need Assistance Feed Self Need Assistance Walk Need Assistance Get In / Out Bed Need Assistance Housework Need Assistance Prepare Meals Need Assistance Handle Money Need Assistance Shop for Self Need Assistance Notes Patient was at home taking care of herself  before surgery on 05/03/2021. Electronic Signature(s) Signed: 06/03/2021 5:30:12 PM By: Gretta Cool, BSN, RN, CWS, Kim RN, BSN Entered By: Gretta Cool, BSN, RN, CWS, Kim on 06/03/2021 14:07:43 [name] ([id number]) -------------------------------------------------------------------------------- Education Screening Details Patient Name: [name] Date of Service: 06/03/2021 12:45 PM Medical Record Number: [id number] Patient Account Number: [id number] Date of Birth/Sex: [birth year] (65 y.o. F) Treating RN: Cornell Barman Primary Care Dreya Buhrman: [name] Other Clinician: Referring Darivs Lunden: Caroline More Treating Marketia Stallsmith/Extender: Skipper Cliche in Treatment: 0 Primary Learner Assessed: Patient Learning Preferences/Education Level/Primary Language Learning Preference: Explanation Highest Education Level: High School Preferred Language: English Cognitive Barrier Language Barrier: No Translator Needed: No Memory Deficit: No Emotional Barrier: No Physical Barrier Impaired Vision: Yes Glasses Impaired Hearing: No Decreased Hand dexterity: No Knowledge/Comprehension Knowledge Level: Medium Comprehension Level: Medium Ability to understand written instructions: Medium Ability to understand verbal instructions: Medium Motivation Anxiety Level: Calm Cooperation: Cooperative Education Importance: Acknowledges Need Interest in Health Problems: Asks Questions Perception: Confused Willingness to Engage in Self-Management High Activities: Readiness to Engage in Self-Management High Activities: Electronic Signature(s) Signed: 06/03/2021 5:30:12 PM By: Gretta Cool, BSN, RN, CWS, Kim RN, BSN Entered By: Gretta Cool, BSN, RN, CWS, Kim on 06/03/2021 14:08:14 [name] ([id number]) -------------------------------------------------------------------------------- Fall Risk Assessment Details Patient Name: [name]. Date of Service: 06/03/2021 12:45 PM Medical Record Number:  [id number] Patient Account Number: [id number] Date of Birth/Sex: [birth year] (65 y.o. F) Treating RN: Cornell Barman Primary Care Bradly Sangiovanni: [name] Other Clinician: Referring Lemma Tetro: Caroline More Treating Janautica Netzley/Extender: Skipper Cliche in Treatment: 0 Fall Risk Assessment Items Have you had 2 or more falls in the last 12 monthso 0 No Have you had any fall that resulted in injury in the last 12 monthso 0 No FALLS RISK SCREEN History of falling - immediate or within 3 months 0 No Secondary diagnosis (Do you have 2 or more medical diagnoseso) 0 No Ambulatory  aid None/bed rest/wheelchair/nurse 0 Yes Crutches/cane/walker 0 No Furniture 0 No Intravenous therapy Access/Saline/Heparin Lock 0 No Gait/Transferring Normal/ bed rest/ wheelchair 0 Yes Weak (short steps with or without shuffle, stooped but able to lift head while walking, may 0 No seek support from furniture) Impaired (short steps with shuffle, may have difficulty arising from chair, head down, impaired 0 No balance) Mental Status Oriented to own ability 0 Yes Electronic Signature(s) Signed: 06/03/2021 5:30:12 PM By: Gretta Cool, BSN, RN, CWS, Kim RN, BSN Entered By: Gretta Cool, BSN, RN, CWS, Kim on 06/03/2021 14:08:50 [name] ([id number]) -------------------------------------------------------------------------------- Foot Assessment Details Patient Name: [name]. Date of Service: 06/03/2021 12:45 PM Medical Record Number: [id number] Patient Account Number: [id number] Date of Birth/Sex: November 28, 1955 (65 y.o. F) Treating RN: Cornell Barman Primary Care Laritza Vokes: [name] Other Clinician: Referring Kanyon Seibold: Caroline More Treating Jennaya Pogue/Extender: Skipper Cliche in Treatment: 0 Foot Assessment Items Site Locations + = Sensation present, - = Sensation absent, C = Callus, U = Ulcer R = Redness, W = Warmth, M = Maceration, PU = Pre-ulcerative lesion F = Fissure, S = Swelling, D =  Dryness Assessment Right: Left: Other Deformity: No No Prior Foot Ulcer: Yes Yes Prior Amputation: Yes No Charcot Joint: No No Ambulatory Status: Non-ambulatory Assistance Device: Wheelchair Gait: Engineer, maintenance) Signed: 06/03/2021 5:30:12 PM By: Gretta Cool, BSN, RN, CWS, Kim RN, BSN Entered By: Gretta Cool, BSN, RN, CWS, Kim on 06/03/2021 14:09:49 [name] ([id number]) -------------------------------------------------------------------------------- Nutrition Risk Screening Details Patient Name: [name]. Date of Service: 06/03/2021 12:45 PM Medical Record Number: [id number] Patient Account Number: [id number] Date of Birth/Sex: Jun 28, 1955 (65 y.o. F) Treating RN: Cornell Barman Primary Care Minetta Krisher: [name] Other Clinician: Referring Christofer Shen: Caroline More Treating Kaydence Menard/Extender: Skipper Cliche in Treatment: 0 Height (in): 65 Weight (lbs): 245.4 Body Mass Index (BMI): 40.8 Nutrition Risk Screening Items Score Screening NUTRITION RISK SCREEN: I have an illness or condition that made me change the kind and/or amount of food I eat 0 No I eat fewer than two meals per day 0 No I eat few fruits and vegetables, or milk products 0 No I have three or more drinks of beer, liquor or wine almost every day 0 No I have tooth or mouth problems that make it hard for me to eat 0 No I don't always have enough money to buy the food I need 0 No I eat alone most of the time 0 No I take three or more different prescribed or over-the-counter drugs a day 1 Yes Without wanting to, I have lost or gained 10 pounds in the last six months 0 No I am not always physically able to shop, cook and/or feed myself 0 No Nutrition Protocols Good Risk Protocol Provide education on elevated Moderate Risk Protocol 0 blood sugars and impact on wound healing, as applicable High Risk Proctocol Risk Level: Good Risk Score: 1 Electronic Signature(s) Signed: 06/03/2021 5:30:12 PM By: Gretta Cool,  BSN, RN, CWS, Kim RN, BSN Entered By: Gretta Cool, BSN, RN, CWS, Kim on 06/03/2021 14:09:08

## 2021-06-03 NOTE — Progress Notes (Addendum)
AMBERLI, RUEGG ([id number]) Visit Report for 06/03/2021 Allergy List Details Patient Name: [name], [name]. Date of Service: 06/03/2021 12:45 PM Medical Record Number: [id number] Patient Account Number: [id number] Date of Birth/Sex: [birth year] (65 y.o. F) Treating RN: Cornell Barman Primary Care Aul Mangieri: [name] Other Clinician: Referring Dante Cooter: Caroline More Treating Pina Sirianni/Extender: Skipper Cliche in Treatment: 0 Allergies Active Allergies No Known Allergies Allergy Notes Electronic Signature(s) Signed: 06/03/2021 5:30:12 PM By: Gretta Cool, BSN, RN, CWS, Kim RN, BSN Entered By: Gretta Cool, BSN, RN, CWS, Kim on 06/03/2021 14:02:35 [name] ([id number]) -------------------------------------------------------------------------------- Arrival Information Details Patient Name: [name] Date of Service: 06/03/2021 12:45 PM Medical Record Number: [id number] Patient Account Number: [id number] Date of Birth/Sex: April 21, 1956 (65 y.o. F) Treating RN: Cornell Barman Primary Care Reisha Wos: [name] Other Clinician: Referring Gaige Fussner: Caroline More Treating Jobie Popp/Extender: Skipper Cliche in Treatment: 0 Visit Information Patient Arrived: Wheel Chair Arrival Time: 13:06 Accompanied By: caregiver Transfer Assistance: None Patient Identification Verified: Yes Secondary Verification Process Completed: Yes Patient Has Alerts: Yes Patient Alerts: Patient on Blood Thinner TYPE II Diabetic Plavix and Lovenox History Since Last Visit Electronic Signature(s) Signed: 06/03/2021 5:51:46 PM By: Gretta Cool, BSN, RN, CWS, Kim RN, BSN Previous Signature: 06/03/2021 5:30:12 PM Version By: Gretta Cool, BSN, RN, CWS, Kim RN, BSN Entered By: Gretta Cool, BSN, RN, CWS, Kim on 06/03/2021 17:51:46 [name] ([id number]) -------------------------------------------------------------------------------- Clinic Level of Care Assessment Details Patient Name: [name]. Date of  Service: 06/03/2021 12:45 PM Medical Record Number: [id number] Patient Account Number: [id number] Date of Birth/Sex: [birth year] (65 y.o. F) Treating RN: Cornell Barman Primary Care Cathyrn Deas: [name] Other Clinician: Referring Jaekwon Mcclune: Caroline More Treating Rashaun Wichert/Extender: Skipper Cliche in Treatment: 0 Clinic Level of Care Assessment Items TOOL 1 Quantity Score []  - Use when EandM and Procedure is performed on INITIAL visit 0 ASSESSMENTS - Nursing Assessment / Reassessment X - General Physical Exam (combine w/ comprehensive assessment (listed just below) when performed on new 1 20 pt. evals) X- 1 25 Comprehensive Assessment (HX, ROS, Risk Assessments, Wounds Hx, etc.) ASSESSMENTS - Wound and Skin Assessment / Reassessment []  - Dermatologic / Skin Assessment (not related to wound area) 0 ASSESSMENTS - Ostomy and/or Continence Assessment and Care []  - Incontinence Assessment and Management 0 []  - 0 Ostomy Care Assessment and Management (repouching, etc.) PROCESS - Coordination of Care X - Simple Patient / Family Education for ongoing care 1 15 []  - 0 Complex (extensive) Patient / Family Education for ongoing care X- 1 10 Staff obtains Programmer, systems, Records, Test Results / Process Orders []  - 0 Staff telephones HHA, Nursing Homes / Clarify orders / etc []  - 0 Routine Transfer to another Facility (non-emergent condition) []  - 0 Routine Hospital Admission (non-emergent condition) X- 1 15 New Admissions / Biomedical engineer / Ordering NPWT, Apligraf, etc. []  - 0 Emergency Hospital Admission (emergent condition) PROCESS - Special Needs []  - Pediatric / Minor Patient Management 0 []  - 0 Isolation Patient Management []  - 0 Hearing / Language / Visual special needs []  - 0 Assessment of Community assistance (transportation, D/C planning, etc.) []  - 0 Additional assistance / Altered mentation []  - 0 Support Surface(s) Assessment (bed, cushion, seat, etc.) INTERVENTIONS  - Miscellaneous []  - External ear exam 0 []  - 0 Patient Transfer (multiple staff / Civil Service fast streamer / Similar devices) []  - 0 Simple Staple / Suture removal (25 or less) []  - 0 Complex Staple / Suture removal (26 or more) []  - 0 Hypo/Hyperglycemic Management (do not  check if billed separately) []  - 0 Ankle / Brachial Index (ABI) - do not check if billed separately Has the patient been seen at the hospital within the last three years: Yes Total Score: 85 Level Of Care: New/Established - Level 3 CHARLESTON, VIERLING ([id number]) Electronic Signature(s) Signed: 06/03/2021 5:30:12 PM By: Gretta Cool, BSN, RN, CWS, Kim RN, BSN Entered By: Gretta Cool, BSN, RN, CWS, Kim on 06/03/2021 17:24:30 [name] ([id number]) -------------------------------------------------------------------------------- Encounter Discharge Information Details Patient Name: [name]. Date of Service: 06/03/2021 12:45 PM Medical Record Number: [id number] Patient Account Number: [id number] Date of Birth/Sex: 1955-09-14 (65 y.o. F) Treating RN: Cornell Barman Primary Care Tomasina Keasling: [name] Other Clinician: Referring Tiana Sivertson: Caroline More Treating Makailey Hodgkin/Extender: Skipper Cliche in Treatment: 0 Encounter Discharge Information Items Discharge Condition: Stable Ambulatory Status: Ambulatory Discharge Destination: Home Transportation: Private Auto Accompanied By: self Schedule Follow-up Appointment: Yes Clinical Summary of Care: Electronic Signature(s) Signed: 06/03/2021 5:25:46 PM By: Gretta Cool, BSN, RN, CWS, Kim RN, BSN Entered By: Gretta Cool, BSN, RN, CWS, Kim on 06/03/2021 17:25:46 [name] ([id number]) -------------------------------------------------------------------------------- General Visit Notes Details Patient Name: [name] Date of Service: 06/03/2021 12:45 PM Medical Record Number: [id number] Patient Account Number: [id number] Date of Birth/Sex: 07/08/1955 (65 y.o. F) Treating RN:  Cornell Barman Primary Care Demetrius Barrell: [name] Other Clinician: Referring Maria Gallicchio: Caroline More Treating Yomira Flitton/Extender: Skipper Cliche in Treatment: 0 Notes Patient arrives in wheel chair from [facility]. She states she had surgical intervention on 05/03/2021. Per patient, Dr. Mariel Kansky removed her right 5th digit and partial R great toe. A debridement was also completed on the left heel at the same time. Patient states she had seen the surgeon on Friday December 2. Nurses at [facility] are changing the dressing per orders from Dr. Luana Shu. The patient states she thinks she has a wound on the right posterior calf and maybe on the left posterior calf. Patient was transferred from her wheelchair to the chair with a lift. This RN noticed dried blood all the way through to the outside layer of the rolled gauze dressing. Once removed, the right posterior lower leg bleed large amounts, saturating a portion of a chux and through multiple packs of gauze used for pressure. PA notified and came in to try to stop the bleeding. RN used Surgifoam to stop bleeding, wrapped in extra absorbent dressing and ace wraps. Bleed copious amounts. Electronic Signature(s) Signed: 06/03/2021 6:14:50 PM By: Gretta Cool, BSN, RN, CWS, Kim RN, BSN Previous Signature: 06/03/2021 6:10:39 PM Version By: Gretta Cool, BSN, RN, CWS, Kim RN, BSN Entered By: Gretta Cool, BSN, RN, CWS, Kim on 06/03/2021 18:14:49 [name] ([id number]) -------------------------------------------------------------------------------- Lower Extremity Assessment Details Patient Name: WALIDA, CAJAS. Date of Service: 06/03/2021 12:45 PM Medical Record Number: [id number] Patient Account Number: [id number] Date of Birth/Sex: 02/09/1956 (65 y.o. F) Treating RN: Cornell Barman Primary Care Eliannah Hinde: [name] Other Clinician: Referring Ricki Clack: Caroline More Treating Treylen Gibbs/Extender: Skipper Cliche in Treatment: 0 Vascular  Assessment Pulses: Dorsalis Pedis Palpable: [Left:Yes] [Right:Yes] Posterior Tibial Doppler Audible: [Right:Inaudible] Electronic Signature(s) Signed: 06/03/2021 5:30:12 PM By: Gretta Cool, BSN, RN, CWS, Kim RN, BSN Entered By: Gretta Cool, BSN, RN, CWS, Kim on 06/03/2021 14:02:30 [name] ([id number]) -------------------------------------------------------------------------------- Multi Wound Chart Details Patient Name: [name] Date of Service: 06/03/2021 12:45 PM Medical Record Number: [id number] Patient Account Number: [id number] Date of Birth/Sex: [birth year] (65 y.o. F) Treating RN: Cornell Barman Primary Care Lynne Righi: [name] Other Clinician: Referring Nixxon Faria: Caroline More Treating Kyrstin Campillo/Extender:  Stone, Hoyt Weeks in Treatment: 0 Vital Signs Height(in): 65 Pulse(bpm): 65 Weight(lbs): 245.4 Blood Pressure(mmHg): 130/77 Body Mass Index(BMI): 41 Temperature(F): 98.2 Respiratory Rate(breaths/min): 16 Photos: [60:No Photos] [N/A:N/A] Wound Location: [60:Right, Posterior Lower Leg] [N/A:N/A] Wounding Event: [60:Pressure Injury] [N/A:N/A] Primary Etiology: [60:Pressure Ulcer] [N/A:N/A] Date Acquired: [60:05/17/2021] [N/A:N/A] Weeks of Treatment: [60:0] [N/A:N/A] Wound Status: [60:Open] [N/A:N/A] Measurements L x W x D (cm) [60:0.8x0.9x0.1] [N/A:N/A] Area (cm) : [60:0.565] [N/A:N/A] Volume (cm) : [60:0.057] [N/A:N/A] % Reduction in Area: [60:0.00%] [N/A:N/A] % Reduction in Volume: [60:0.00%] [N/A:N/A] Classification: [60:Category/Stage III] [N/A:N/A] Exudate Amount: [60:Large] [N/A:N/A] Exudate Type: [60:Sanguinous] [N/A:N/A] Exudate Color: [60:red] [N/A:N/A] Wound Margin: [60:Indistinct, nonvisible] [N/A:N/A] Granulation Amount: [60:None Present (0%)] [N/A:N/A] Necrotic Amount: [60:None Present (0%)] [N/A:N/A] Exposed Structures: [60:Fascia: No Fat Layer (Subcutaneous Tissue): No Tendon: No Muscle: No Joint: No Bone: No] [N/A:N/A] Assessment Notes:  [60:Patient has area that blood is dripping from. Pressure would not stop the bleeding. Surgiform along with pressure applied to wound bed controlled the bleeding.] [N/A:N/A] Treatment Notes Electronic Signature(s) Signed: 06/03/2021 5:30:12 PM By: Gretta Cool, BSN, RN, CWS, Kim RN, BSN Entered By: Gretta Cool, BSN, RN, CWS, Kim on 06/03/2021 14:23:16 [name] ([id number]) -------------------------------------------------------------------------------- Multi-Disciplinary Care Plan Details Patient Name: [name] Date of Service: 06/03/2021 12:45 PM Medical Record Number: [id number] Patient Account Number: [id number] Date of Birth/Sex: Nov 26, 1955 (65 y.o. F) Treating RN: Cornell Barman Primary Care Monchel Pollitt: [name] Other Clinician: Referring Zaydon Kinser: Caroline More Treating Dylann Layne/Extender: Skipper Cliche in Treatment: 0 Active Inactive Orientation to the Wound Care Program Nursing Diagnoses: Knowledge deficit related to the wound healing center program Goals: Patient/caregiver will verbalize understanding of the [place] Program Date Initiated: 06/03/2021 Target Resolution Date: 06/03/2021 Goal Status: Active Interventions: Provide education on orientation to the wound center Notes: Wound/Skin Impairment Nursing Diagnoses: Impaired tissue integrity Knowledge deficit related to smoking impact on wound healing Knowledge deficit related to ulceration/compromised skin integrity Goals: Patient/caregiver will verbalize understanding of skin care regimen Date Initiated: 06/03/2021 Target Resolution Date: 06/03/2021 Goal Status: Active Interventions: Assess ulceration(s) every visit Provide education on ulcer and skin care Treatment Activities: Skin care regimen initiated : 06/03/2021 Topical wound management initiated : 06/03/2021 Notes: Electronic Signature(s) Signed: 06/03/2021 5:30:12 PM By: Gretta Cool, BSN, RN, CWS, Kim RN, BSN Entered By: Gretta Cool, BSN, RN, CWS,  Kim on 06/03/2021 14:23:00 [name] ([id number]) -------------------------------------------------------------------------------- Pain Assessment Details Patient Name: [name]. Date of Service: 06/03/2021 12:45 PM Medical Record Number: [id number] Patient Account Number: [id number] Date of Birth/Sex: 04-05-1956 (65 y.o. F) Treating RN: Cornell Barman Primary Care Kainoah Bartosiewicz: [name] Other Clinician: Referring Deeanne Deininger: Caroline More Treating Amaya Blakeman/Extender: Skipper Cliche in Treatment: 0 Active Problems Location of Pain Severity and Description of Pain Patient Has Paino Yes Site Locations Pain Location: Pain in Ulcers Pain Management and Medication Current Pain Management: Electronic Signature(s) Signed: 06/03/2021 5:30:12 PM By: Gretta Cool, BSN, RN, CWS, Kim RN, BSN Entered By: Gretta Cool, BSN, RN, CWS, Kim on 06/03/2021 13:46:32 [name] ([id number]) -------------------------------------------------------------------------------- Patient/Caregiver Education Details Patient Name: [name] Date of Service: 06/03/2021 12:45 PM Medical Record Number: [id number] Patient Account Number: [id number] Date of Birth/Gender: 17-Aug-1955 (65 y.o. F) Treating RN: Cornell Barman Primary Care Physician: [name] Other Clinician: Referring Physician: Caroline More Treating Physician/Extender: Skipper Cliche in Treatment: 0 Education Assessment Education Provided To: Patient Education Topics Provided Welcome To [facility]: Handouts: Welcome To [facility] Methods: Demonstration Responses: State content correctly Wound/Skin Impairment: Handouts: Caring for Your Ulcer  Methods: Demonstration Responses: State content correctly Electronic Signature(s) Signed: 06/03/2021 5:30:12 PM By: Gretta Cool, BSN, RN, CWS, Kim RN, BSN Entered By: Gretta Cool, BSN, RN, CWS, Kim on 06/03/2021 17:25:02 [name]  ([id number]) -------------------------------------------------------------------------------- Wound Assessment Details Patient Name: [name] Date of Service: 06/03/2021 12:45 PM Medical Record Number: [id number] Patient Account Number: [id number] Date of Birth/Sex: 1955/07/06 (65 y.o. F) Treating RN: Cornell Barman Primary Care Sherria Riemann: [name] Other Clinician: Referring Lelan Cush: Caroline More Treating Hakeem Frazzini/Extender: Skipper Cliche in Treatment: 0 Wound Status Wound Number: 60 Primary Etiology: Pressure Ulcer Wound Location: Right, Posterior Lower Leg Wound Status: Open Wounding Event: Pressure Injury Date Acquired: 05/17/2021 Weeks Of Treatment: 0 Clustered Wound: No Wound Measurements Length: (cm) 0.8 Width: (cm) 0.9 Depth: (cm) 0.1 Area: (cm) 0.565 Volume: (cm) 0.057 % Reduction in Area: 0% % Reduction in Volume: 0% Wound Description Classification: Category/Stage III Wound Margin: Indistinct, nonvisible Exudate Amount: Large Exudate Type: Sanguinous Exudate Color: red Foul Odor After Cleansing: No Slough/Fibrino No Wound Bed Granulation Amount: None Present (0%) Exposed Structure Necrotic Amount: None Present (0%) Fascia Exposed: No Fat Layer (Subcutaneous Tissue) Exposed: No Tendon Exposed: No Muscle Exposed: No Joint Exposed: No Bone Exposed: No Assessment Notes Patient has area that blood is dripping from. Pressure would not stop the bleeding. Surgiform along with pressure applied to wound bed controlled the bleeding. Treatment Notes Wound #60 (Lower Leg) Wound Laterality: Right, Posterior Cleanser Peri-Wound Care Topical Primary Dressing Surgifoam Discharge Instruction: used to stop bleeding Secondary Dressing xeroform Zetuvit Secured With 64M ACE Elastic Bandage With [employer] Closure, 4 (in) Compression VERLYN, DANNENBERG ([id number]) Compression Stockings Add-Ons Electronic Signature(s) Signed: 06/03/2021 5:30:12  PM By: Gretta Cool, BSN, RN, CWS, Kim RN, BSN Entered By: Gretta Cool, BSN, RN, CWS, Kim on 06/03/2021 14:01:56 [name] ([id number]) -------------------------------------------------------------------------------- Vitals Details Patient Name: [name]. Date of Service: 06/03/2021 12:45 PM Medical Record Number: [id number] Patient Account Number: [id number] Date of Birth/Sex: 1956/01/20 (65 y.o. F) Treating RN: Cornell Barman Primary Care Gerritt Galentine: [name] Other Clinician: Referring Milany Geck: Caroline More Treating Hashem Goynes/Extender: Skipper Cliche in Treatment: 0 Vital Signs Time Taken: 13:30 Temperature (F): 98.2 Height (in): 65 Pulse (bpm): 65 Weight (lbs): 245.4 Respiratory Rate (breaths/min): 16 Source: Measured Blood Pressure (mmHg): 130/77 Body Mass Index (BMI): 40.8 Reference Range: 80 - 120 mg / dl Electronic Signature(s) Signed: 06/03/2021 5:30:12 PM By: Gretta Cool, BSN, RN, CWS, Kim RN, BSN Entered By: Gretta Cool, BSN, RN, CWS, Kim on 06/03/2021 13:58:24

## 2021-06-04 NOTE — Progress Notes (Addendum)
LEISA, GAULT ([id number]) Visit Report for 06/03/2021 Chief Complaint Document Details Patient Name: [name], [name]. Date of Service: 06/03/2021 12:45 PM Medical Record Number: [id number] Patient Account Number: [id number] Date of Birth/Sex: Jan 26, 1956 (65 y.o. F) Treating RN: Cornell Barman Primary Care Provider: [name] Other Clinician: Referring Provider: Caroline More Treating Provider/Extender: Skipper Cliche in Treatment: 0 Information Obtained from: Patient Chief Complaint Bilateral lower extremity phlebolymphedema and recurrent calf ulcerations. Electronic Signature(s) Signed: 06/03/2021 2:16:45 PM By: Worthy Keeler PA-C Entered By: Worthy Keeler on 06/03/2021 14:16:44 [name] ([id number]) -------------------------------------------------------------------------------- HPI Details Patient Name: [name] Date of Service: 06/03/2021 12:45 PM Medical Record Number: [id number] Patient Account Number: [id number] Date of Birth/Sex: December 26, 1955 (65 y.o. F) Treating RN: Cornell Barman Primary Care Provider: [name] Other Clinician: Referring Provider: Caroline More Treating Provider/Extender: Skipper Cliche in Treatment: 0 History of Present Illness HPI Description: 65yo w/ h/o BLE phlebolymphedema, type 2 DM (unknown hemoglobin A1c), and obesity. No PVD. h/o chronic, recurrent BLE calf ulcers. Treated with 4 layer compression. Infrequently uses lymphedema pump. Bilateral lower extremity ulcerations healed as of June 2016. Fitted for custom compression stockings but did not receive them. Patient could not travel for lymphedema consult. She developed recurrent bilateral lower extremity ulcerations in August 2016. She is able to heal these quite quickly with 4 layer elastic compression bandages. However, she is not very compliant using her juxtalite compression garments and tends to develop recurrent ulcerations while using juxtalite. Unna boots  were applied this past week. She tolerated this well but performs minimal ambulation. She is scheduled to undergo total hip replacement tomorrow with Dr. Rudene Christians. She is unsure if he is aware of her ulcerations. She is otherwise without complaints. No significant pain. No fever or chills. Moderate clear drainage. Not on any antibiotics. 06/04/2015 -- she did have her hip surgery and has now been in rehabilitation and they're controlling her fluid intake and diuretics to the extent that she has lost 13 pounds of weight and her lower extremity circumference is decreased by 7 cm. 06/18/2015 -- she is still in the rehabilitation facility and continues to be looked after well and has lost a total of 15 pounds and her diabetes is under much better control. Readmission: 06/03/2021 upon evaluation today patient actually appears to be doing somewhat poorly in regard to a wound on the right posterior lower leg. Patient does have a history of previously having been seen here in the clinic but this predates my time here back in 2017. Subsequently unfortunately right now she is having a significant issue with bleeding in the posterior of her right calf this appears to be a deep tissue injury as developed into an ulceration. She does have a history significant for lymphedema, diabetes mellitus type 2, diabetic neuropathy, hypertension, hypothyroidism, and obesity. She is currently in a skilled nursing facility. The dressing that she had in place during evaluation today was Xeroform followed by significant amount of gauze and this was severely stuck. Upon removal the patient was having significant bleeding she also had significant work done in the hospital in regard to her blood flow and Dr. Lucky Cowboy does look like he has her on 2 blood thinners Plavix and Lovenox. This is probably part of the reason why she is bleeding so significantly. With that being said I do believe that based on what we are seeing currently this is  probably can to be something that were getting have to try to get stopped  with more than just pressure to the region currently. I think surgery foam is probably can to be necessary. If we can get the stop of the Surgifoam she is probably can have to go to the hospital to try to get this bleeding stopped. Electronic Signature(s) Signed: 06/04/2021 6:03:36 PM By: Worthy Keeler PA-C Previous Signature: 06/03/2021 6:22:17 PM Version By: Worthy Keeler PA-C Entered By: Worthy Keeler on 06/04/2021 18:03:36 [name] ([id number]) -------------------------------------------------------------------------------- Physical Exam Details Patient Name: [name] Date of Service: 06/03/2021 12:45 PM Medical Record Number: [id number] Patient Account Number: [id number] Date of Birth/Sex: 12/18/1955 (65 y.o. F) Treating RN: Cornell Barman Primary Care Provider: [name] Other Clinician: Referring Provider: Caroline More Treating Provider/Extender: Skipper Cliche in Treatment: 0 Constitutional sitting or standing blood pressure is within target range for patient.. pulse regular and within target range for patient.Marland Kitchen respirations regular, non- labored and within target range for patient.Marland Kitchen temperature within target range for patient.. Obese and well-hydrated in no acute distress. Eyes conjunctiva clear no eyelid edema noted. pupils equal round and reactive to light and accommodation. Ears, Nose, Mouth, and Throat no gross abnormality of ear auricles or external auditory canals. normal hearing noted during conversation. mucus membranes moist. Respiratory normal breathing without difficulty. Cardiovascular 1+ dorsalis pedis/posterior tibialis pulses. 1+ pitting edema of the bilateral lower extremities. Musculoskeletal Patient unable to walk without assistance. no significant deformity or arthritic changes, no loss or range of motion, no clubbing. Psychiatric this patient is able to make  decisions and demonstrates good insight into disease process. Alert and Oriented x 3. pleasant and cooperative. Notes Upon inspection patient's wound again was, diffuse there was not a definitive deep ulceration on the right leg and she had more discoloration and deep tissue injury on the left leg. Nonetheless I do believe that based on what we are seeing currently this needs to be addressed sooner rather than later. I think we need to try to get the bleeding stopped and I discussed that with her today. We therefore did use the Surgifoam followed by pressure and were able to achieve hemostasis from what we see currently. Again this is getting need to stay in place the patient is not going to be able to have this changed they can rinse it with water when they are changing this and reapply the Xeroform but she should not have this directly removed and reabsorb in the next 4 to 6 weeks. Electronic Signature(s) Signed: 06/04/2021 6:04:29 PM By: Worthy Keeler PA-C Entered By: Worthy Keeler on 06/04/2021 18:04:29 [name] ([id number]) -------------------------------------------------------------------------------- Physician Orders Details Patient Name: [name] Date of Service: 06/03/2021 12:45 PM Medical Record Number: [id number] Patient Account Number: [id number] Date of Birth/Sex: August 23, 1955 (65 y.o. F) Treating RN: Cornell Barman Primary Care Provider: [name] Other Clinician: Referring Provider: Caroline More Treating Provider/Extender: Skipper Cliche in Treatment: 0 Verbal / Phone Orders: No Diagnosis Coding ICD-10 Coding Code Description I89.0 Lymphedema, not elsewhere classified E11.622 Type 2 diabetes mellitus with other skin ulcer E11.40 Type 2 diabetes mellitus with diabetic neuropathy, unspecified L89.893 Pressure ulcer of other site, stage 3 L89.896 Pressure-induced deep tissue damage of other site I10 Essential (primary) hypertension E03.8 Other  specified hypothyroidism E66.09 Other obesity due to excess calories Follow-up Appointments Wound #60 Right,Posterior Lower Leg o Return Appointment in 3 weeks. Bathing/ Shower/ Hygiene o Other: - Do not remove surgifoam. Wash area with saline and do not scrub. Edema Control - Lymphedema /  Segmental Compressive Device / Other o Other: - Lymphedema-use ace wraps to help control swelling. Off-Loading o Other: - Put pillows under knees to keep pressure off of calves. Deep tissue injury on posterior calves. Additional Orders / Instructions o Follow Nutritious Diet and Increase Protein Intake o Other: - Patient denies wound on sacrum. If there is a wound on the sacrum, please let us know so we can check it at her next visit. Wound Treatment Wound #60 - Lower Leg Wound Laterality: Right, Posterior Primary Dressing: Surgifoam Discharge Instructions: used to stop bleeding Secondary Dressing: xeroform Secondary Dressing: Zetuvit Secured With: 70M ACE Elastic Bandage With VELCRO Brand Closure, 4 (in) Electronic Signature(s) Signed: 06/03/2021 5:30:12 PM By: Gretta Cool, BSN, RN, CWS, Kim RN, BSN Signed: 06/03/2021 6:23:09 PM By: Worthy Keeler PA-C Entered By: Gretta Cool, BSN, RN, CWS, Kim on 06/03/2021 14:29:58 [name] ([id number]) -------------------------------------------------------------------------------- Problem List Details Patient Name: [name]. Date of Service: 06/03/2021 12:45 PM Medical Record Number: [id number] Patient Account Number: [id number] Date of Birth/Sex: 1956-06-16 (65 y.o. F) Treating RN: Cornell Barman Primary Care Provider: [name] Other Clinician: Referring Provider: Caroline More Treating Provider/Extender: Skipper Cliche in Treatment: 0 Active Problems ICD-10 Encounter Code Description Active Date MDM Diagnosis I89.0 Lymphedema, not elsewhere classified 06/03/2021 No Yes E11.622 Type 2 diabetes mellitus with other skin ulcer  06/03/2021 No Yes E11.40 Type 2 diabetes mellitus with diabetic neuropathy, unspecified 06/03/2021 No Yes L89.893 Pressure ulcer of other site, stage 3 06/03/2021 No Yes L89.896 Pressure-induced deep tissue damage of other site 06/03/2021 No Yes I10 Essential (primary) hypertension 06/03/2021 No Yes E03.8 Other specified hypothyroidism 06/03/2021 No Yes E66.09 Other obesity due to excess calories 06/03/2021 No Yes Inactive Problems Resolved Problems Electronic Signature(s) Signed: 06/03/2021 2:16:27 PM By: Worthy Keeler PA-C Entered By: Worthy Keeler on 06/03/2021 14:16:27 [name] ([id number]) -------------------------------------------------------------------------------- Progress Note Details Patient Name: [name]. Date of Service: 06/03/2021 12:45 PM Medical Record Number: [id number] Patient Account Number: [id number] Date of Birth/Sex: [birth year] (65 y.o. F) Treating RN: Cornell Barman Primary Care Provider: [name] Other Clinician: Referring Provider: Caroline More Treating Provider/Extender: Skipper Cliche in Treatment: 0 Subjective Chief Complaint Information obtained from Patient Bilateral lower extremity phlebolymphedema and recurrent calf ulcerations. History of Present Illness (HPI) 65yo w/ h/o BLE phlebolymphedema, type 2 DM (unknown hemoglobin A1c), and obesity. No PVD. h/o chronic, recurrent BLE calf ulcers. Treated with 4 layer compression. Infrequently uses lymphedema pump. Bilateral lower extremity ulcerations healed as of June 2016. Fitted for custom compression stockings but did not receive them. Patient could not travel for lymphedema consult. She developed recurrent bilateral lower extremity ulcerations in August 2016. She is able to heal these quite quickly with 4 layer elastic compression bandages. However, she is not very compliant using her juxtalite compression garments and tends to develop recurrent ulcerations while using  juxtalite. Unna boots were applied this past week. She tolerated this well but performs minimal ambulation. She is scheduled to undergo total hip replacement tomorrow with Dr. Rudene Christians. She is unsure if he is aware of her ulcerations. She is otherwise without complaints. No significant pain. No fever or chills. Moderate clear drainage. Not on any antibiotics. 06/04/2015 -- she did have her hip surgery and has now been in rehabilitation and they're controlling her fluid intake and diuretics to the extent that she has lost 13 pounds of weight and her lower extremity circumference is decreased by 7 cm. 06/18/2015 -- she is still in the  rehabilitation facility and continues to be looked after well and has lost a total of 15 pounds and her diabetes is under much better control. Readmission: 06/03/2021 upon evaluation today patient actually appears to be doing somewhat poorly in regard to a wound on the right posterior lower leg. Patient does have a history of previously having been seen here in the clinic but this predates my time here back in 2017. Subsequently unfortunately right now she is having a significant issue with bleeding in the posterior of her right calf this appears to be a deep tissue injury as developed into an ulceration. She does have a history significant for lymphedema, diabetes mellitus type 2, diabetic neuropathy, hypertension, hypothyroidism, and obesity. She is currently in a skilled nursing facility. The dressing that she had in place during evaluation today was Xeroform followed by significant amount of gauze and this was severely stuck. Upon removal the patient was having significant bleeding she also had significant work done in the hospital in regard to her blood flow and Dr. Lucky Cowboy does look like he has her on 2 blood thinners Plavix and Lovenox. This is probably part of the reason why she is bleeding so significantly. With that being said I do believe that based on what we are  seeing currently this is probably can to be something that were getting have to try to get stopped with more than just pressure to the region currently. I think surgery foam is probably can to be necessary. If we can get the stop of the Surgifoam she is probably can have to go to the hospital to try to get this bleeding stopped. Patient History Information obtained from Patient. Allergies No Known Allergies Family History Cancer - Mother, Diabetes - Paternal Grandparents,Father, Heart Disease - Father, Hypertension - Father,Mother, Lung Disease - Father, Thyroid Problems - Mother, No family history of Hereditary Spherocytosis, Kidney Disease, Seizures, Stroke, Tuberculosis. Social History Former smoker, Marital Status - Single, Alcohol Use - Never, Drug Use - No History, Caffeine Use - Rarely. Medical History Eyes Denies history of Cataracts, Glaucoma, Optic Neuritis Ear/Nose/Mouth/Throat Denies history of Chronic sinus problems/congestion, Middle ear problems Hematologic/Lymphatic Patient has history of Lymphedema Denies history of Anemia, Hemophilia, Human Immunodeficiency Virus, Sickle Cell Disease Respiratory Reining, [name] ([id number]) Denies history of Aspiration, Asthma, Chronic Obstructive Pulmonary Disease (COPD), Pneumothorax, Sleep Apnea, Tuberculosis Cardiovascular Patient has history of Hypertension, Peripheral Arterial Disease, Peripheral Venous Disease Denies history of Angina, Arrhythmia, Congestive Heart Failure, Coronary Artery Disease, Deep Vein Thrombosis, Hypotension, Myocardial Infarction, Phlebitis, Vasculitis Gastrointestinal Denies history of Cirrhosis , Colitis, Crohn s, Hepatitis A, Hepatitis B, Hepatitis C Endocrine Patient has history of Type II Diabetes Denies history of Type I Diabetes Genitourinary 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 Immunological Denies history of Lupus Erythematosus, Raynaud s, Scleroderma Integumentary  (Skin) Denies history of History of Burn, History of pressure wounds Musculoskeletal Patient has history of Osteoarthritis - Knees Denies history of Gout, Rheumatoid Arthritis, Osteomyelitis Neurologic Patient has history of Neuropathy - feet Denies history of Dementia, Quadriplegia, Paraplegia, Seizure Disorder Oncologic Denies history of Received Chemotherapy, Received Radiation Psychiatric Denies history of Anorexia/bulimia, Confinement Anxiety Review of Systems (ROS) Cardiovascular Complains or has symptoms of LE edema. Endocrine Complains or has symptoms of Thyroid disease - Hypothyroidism. Integumentary (Skin) Complains or has symptoms of Wounds, Bleeding or bruising tendency - Lovanox and Plavix. Musculoskeletal Complains or has symptoms of Muscle Weakness - generalized weakness. Objective Constitutional sitting or standing blood pressure  is within target range for patient.. pulse regular and within target range for patient.Marland Kitchen respirations regular, non- labored and within target range for patient.Marland Kitchen temperature within target range for patient.. Obese and well-hydrated in no acute distress. Vitals Time Taken: 1:30 PM, Height: 65 in, Weight: 245.4 lbs, Source: Measured, BMI: 40.8, Temperature: 98.2 F, Pulse: 65 bpm, Respiratory Rate: 16 breaths/min, Blood Pressure: 130/77 mmHg. Eyes conjunctiva clear no eyelid edema noted. pupils equal round and reactive to light and accommodation. Ears, Nose, Mouth, and Throat no gross abnormality of ear auricles or external auditory canals. normal hearing noted during conversation. mucus membranes moist. Respiratory normal breathing without difficulty. Cardiovascular 1+ dorsalis pedis/posterior tibialis pulses. 1+ pitting edema of the bilateral lower extremities. Musculoskeletal Patient unable to walk without assistance. no significant deformity or arthritic changes, no loss or range of motion, no clubbing. Psychiatric this patient is  able to make decisions and demonstrates good insight into disease process. Alert and Oriented x 3. pleasant and cooperative. General Notes: Upon inspection patient's wound again was, diffuse there was not a definitive deep ulceration on the right leg and she had more discoloration and deep tissue injury on the left leg. Nonetheless I do believe that based on what we are seeing currently this needs to be addressed sooner rather than later. I think we need to try to get the bleeding stopped and I discussed that with her today. We therefore did use the Surgifoam followed by pressure and were able to achieve hemostasis from what we see currently. Again this is getting need to stay in place [name], [name] ([id number]) the patient is not going to be able to have this changed they can rinse it with water when they are changing this and reapply the Xeroform but she should not have this directly removed and reabsorb in the next 4 to 6 weeks. Integumentary (Hair, Skin) Wound #60 status is Open. Original cause of wound was Pressure Injury. The date acquired was: 05/17/2021. The wound is located on the Right,Posterior Lower Leg. The wound measures 0.8cm length x 0.9cm width x 0.1cm depth; 0.565cm^2 area and 0.057cm^3 volume. There is a large amount of sanguinous drainage noted. The wound margin is indistinct and nonvisible. There is no granulation within the wound bed. There is no necrotic tissue within the wound bed. General Notes: Patient has area that blood is dripping from. Pressure would not stop the bleeding. Surgiform along with pressure applied to wound bed controlled the bleeding. Assessment Active Problems ICD-10 Lymphedema, not elsewhere classified Type 2 diabetes mellitus with other skin ulcer Type 2 diabetes mellitus with diabetic neuropathy, unspecified Pressure ulcer of other site, stage 3 Pressure-induced deep tissue damage of other site Essential (primary) hypertension Other  specified hypothyroidism Other obesity due to excess calories Plan Follow-up Appointments: Wound #60 Right,Posterior Lower Leg: Return Appointment in 3 weeks. Bathing/ Shower/ Hygiene: Other: - Do not remove surgifoam. Wash area with saline and do not scrub. Edema Control - Lymphedema / Segmental Compressive Device / Other: Other: - Lymphedema-use ace wraps to help control swelling. Off-Loading: Other: - Put pillows under knees to keep pressure off of calves. Deep tissue injury on posterior calves. Additional Orders / Instructions: Follow Nutritious Diet and Increase Protein Intake Other: - Patient denies wound on sacrum. If there is a wound on the sacrum, please let us know so we can check it at her next visit. WOUND #60: - Lower Leg Wound Laterality: Right, Posterior Primary Dressing: Surgifoam Discharge Instructions: used to stop bleeding Secondary  Dressing: xeroform Secondary Dressing: Zetuvit Secured With: 55M ACE Elastic Bandage With VELCRO Brand Closure, 4 (in) 1. I am good recommend currently that we see the patient back in 3 weeks to reevaluate and see where we stand. Right now have surgery foam in place I am hopeful that this will keep the bleeding from reoccurring unfortunately she needs to keep pressure off of the back of her legs as well. That is a significant issue based on what I am seeing currently. I discussed that with her today as well as the Caregiver from [facility]. 2. We are also can send orders back for what does need to be done and what does not need to be done with regard to caring for her ulcerations. 3. I am also can recommend the patient continue to elevate her legs much as possible that she should be keeping pressure off of her heels and the back of her legs using pillows behind her knees she voiced understanding. 4. With regard to the bleeding she is on 2 blood thinners that is a big part of the issue here but my opinion is if Dr. Lucky Cowboy has her on these he  has her on these for a reason and its necessary. She has 3 stents placed in both legs that were just recently done and I am sure he is concerned about keeping her blood flow open so that she does not have a limb threatening situation. We will see patient back for reevaluation in 1 week here in the clinic. If anything worsens or changes patient will contact our office for additional recommendations. Electronic Signature(s) Signed: 06/04/2021 6:06:06 PM By: Worthy Keeler PA-C Entered By: Worthy Keeler on 06/04/2021 18:06:06 [name] ([name] ([id number]) -------------------------------------------------------------------------------- ROS/PFSH Details Patient Name: [name] Date of Service: 06/03/2021 12:45 PM Medical Record Number: [id number] Patient Account Number: [id number] Date of Birth/Sex: Aug 04, 1955 (65 y.o. F) Treating RN: Cornell Barman Primary Care Provider: [name] Other Clinician: Referring Provider: Caroline More Treating Provider/Extender: Skipper Cliche in Treatment: 0 Information Obtained From Patient Cardiovascular Complaints and Symptoms: Positive for: LE edema Medical History: Positive for: Hypertension; Peripheral Arterial Disease; Peripheral Venous Disease Negative for: Angina; Arrhythmia; Congestive Heart Failure; Coronary Artery Disease; Deep Vein Thrombosis; Hypotension; Myocardial Infarction; Phlebitis; Vasculitis Endocrine Complaints and Symptoms: Positive for: Thyroid disease - Hypothyroidism Medical History: Positive for: Type II Diabetes Negative for: Type I Diabetes Time with diabetes: 10 years Treated with: Oral agents Blood sugar tested every day: No Integumentary (Skin) Complaints and Symptoms: Positive for: Wounds; Bleeding or bruising tendency - Lovanox and Plavix Medical History: Negative for: History of Burn; History of pressure wounds Musculoskeletal Complaints and Symptoms: Positive for:  Muscle Weakness - generalized weakness Medical History: Positive for: Osteoarthritis - Knees Negative for: Gout; Rheumatoid Arthritis; Osteomyelitis Eyes Medical History: Negative for: Cataracts; Glaucoma; Optic Neuritis Ear/Nose/Mouth/Throat Medical History: Negative for: Chronic sinus problems/congestion; Middle ear problems Hematologic/Lymphatic Medical History: Positive for: Lymphedema Negative for: Anemia; Hemophilia; Human Immunodeficiency Virus; Sickle Cell Disease Respiratory [name], [name]. 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 Immunological Medical History: Negative for: Lupus Erythematosus; Raynaudos; Scleroderma Neurologic Medical History: Positive for: Neuropathy - feet Negative for: Dementia; Quadriplegia; Paraplegia; Seizure Disorder Oncologic Medical History: Negative for: Received Chemotherapy; Received  Radiation Psychiatric Medical History: Negative for: Anorexia/bulimia; Confinement Anxiety Immunizations Pneumococcal Vaccine: Received Pneumococcal Vaccination: Yes Received Pneumococcal Vaccination On or After 60th Birthday: Yes Implantable Devices None Family and Social History Cancer: Yes - Mother; Diabetes: Yes - Paternal Grandparents,Father; Heart Disease: Yes - Father; Hereditary Spherocytosis: No; Hypertension: Yes - Father,Mother; Kidney Disease: No; Lung Disease: Yes - Father; Seizures: No; Stroke: No; Thyroid Problems: Yes - Mother; Tuberculosis: No; Former smoker; Marital Status - Single; Alcohol Use: Never; Drug Use: No History; Caffeine Use: Rarely; Financial Concerns: No; Food, Clothing or Shelter Needs: No; Support System Lacking: No;  Transportation Concerns: No Engineer, maintenance) Signed: 06/03/2021 5:30:12 PM By: Gretta Cool, BSN, RN, CWS, Kim RN, BSN Signed: 06/03/2021 6:23:09 PM By: Worthy Keeler PA-C Entered By: Gretta Cool, BSN, RN, CWS, Kim on 06/03/2021 14:06:52 [name] ([id number]) -------------------------------------------------------------------------------- SuperBill Details Patient Name: [name]. Date of Service: 06/03/2021 Medical Record Number: [id number] Patient Account Number: [id number] Date of Birth/Sex: 14-Oct-1955 (65 y.o. F) Treating RN: Cornell Barman Primary Care Provider: [name] Other Clinician: Referring Provider: Caroline More Treating Provider/Extender: Skipper Cliche in Treatment: 0 Diagnosis Coding ICD-10 Codes Code Description I89.0 Lymphedema, not elsewhere classified E11.622 Type 2 diabetes mellitus with other skin ulcer E11.40 Type 2 diabetes mellitus with diabetic neuropathy, unspecified L89.893 Pressure ulcer of other site, stage 3 L89.896 Pressure-induced deep tissue damage of other site I10 Essential (primary) hypertension E03.8 Other specified hypothyroidism E66.09 Other obesity due to excess calories Facility Procedures CPT4 Code: 88325498 Description: 99213 - WOUND CARE VISIT-LEV 3 EST PT Modifier: Quantity: 1 Physician Procedures CPT4 Code: 2641583 Description: 09407 - WC PHYS LEVEL 4 - NEW PT Modifier: Quantity: 1 CPT4 Code: Description: ICD-10 Diagnosis Description I89.0 Lymphedema, not elsewhere classified E11.622 Type 2 diabetes mellitus with other skin ulcer E11.40 Type 2 diabetes mellitus with diabetic neuropathy, unspecified L89.893 Pressure ulcer of other site, stage  3 Modifier: Quantity: Electronic Signature(s) Signed: 06/03/2021 6:22:47 PM By: Worthy Keeler PA-C Entered By: Worthy Keeler on 06/03/2021 18:22:46

## 2021-06-07 DIAGNOSIS — R69 Illness, unspecified: Secondary | ICD-10-CM | POA: Diagnosis not present

## 2021-06-07 DIAGNOSIS — F4321 Adjustment disorder with depressed mood: Secondary | ICD-10-CM | POA: Diagnosis not present

## 2021-06-09 DIAGNOSIS — M6281 Muscle weakness (generalized): Secondary | ICD-10-CM | POA: Diagnosis not present

## 2021-06-09 DIAGNOSIS — R2681 Unsteadiness on feet: Secondary | ICD-10-CM | POA: Diagnosis not present

## 2021-06-09 DIAGNOSIS — R278 Other lack of coordination: Secondary | ICD-10-CM | POA: Diagnosis not present

## 2021-06-15 DIAGNOSIS — R69 Illness, unspecified: Secondary | ICD-10-CM | POA: Diagnosis not present

## 2021-06-15 DIAGNOSIS — I70209 Unspecified atherosclerosis of native arteries of extremities, unspecified extremity: Secondary | ICD-10-CM | POA: Diagnosis not present

## 2021-06-15 DIAGNOSIS — R52 Pain, unspecified: Secondary | ICD-10-CM | POA: Diagnosis not present

## 2021-06-15 DIAGNOSIS — E1151 Type 2 diabetes mellitus with diabetic peripheral angiopathy without gangrene: Secondary | ICD-10-CM | POA: Diagnosis not present

## 2021-06-15 DIAGNOSIS — I110 Hypertensive heart disease with heart failure: Secondary | ICD-10-CM | POA: Diagnosis not present

## 2021-06-22 DIAGNOSIS — G894 Chronic pain syndrome: Secondary | ICD-10-CM | POA: Diagnosis not present

## 2021-06-22 DIAGNOSIS — I70209 Unspecified atherosclerosis of native arteries of extremities, unspecified extremity: Secondary | ICD-10-CM | POA: Diagnosis not present

## 2021-06-22 DIAGNOSIS — E1151 Type 2 diabetes mellitus with diabetic peripheral angiopathy without gangrene: Secondary | ICD-10-CM | POA: Diagnosis not present

## 2021-06-22 DIAGNOSIS — I110 Hypertensive heart disease with heart failure: Secondary | ICD-10-CM | POA: Diagnosis not present

## 2021-06-24 DIAGNOSIS — E119 Type 2 diabetes mellitus without complications: Secondary | ICD-10-CM | POA: Diagnosis not present

## 2021-06-25 ENCOUNTER — Other Ambulatory Visit: Payer: Self-pay

## 2021-06-25 ENCOUNTER — Encounter: Payer: Medicare HMO | Admitting: Internal Medicine

## 2021-06-25 DIAGNOSIS — L89896 Pressure-induced deep tissue damage of other site: Secondary | ICD-10-CM | POA: Diagnosis not present

## 2021-06-25 DIAGNOSIS — E1140 Type 2 diabetes mellitus with diabetic neuropathy, unspecified: Secondary | ICD-10-CM | POA: Diagnosis not present

## 2021-06-25 DIAGNOSIS — E668 Other obesity: Secondary | ICD-10-CM | POA: Diagnosis not present

## 2021-06-25 DIAGNOSIS — I10 Essential (primary) hypertension: Secondary | ICD-10-CM | POA: Diagnosis not present

## 2021-06-25 DIAGNOSIS — E11622 Type 2 diabetes mellitus with other skin ulcer: Secondary | ICD-10-CM | POA: Diagnosis not present

## 2021-06-25 DIAGNOSIS — L97222 Non-pressure chronic ulcer of left calf with fat layer exposed: Secondary | ICD-10-CM | POA: Diagnosis not present

## 2021-06-25 DIAGNOSIS — I120 Hypertensive chronic kidney disease with stage 5 chronic kidney disease or end stage renal disease: Secondary | ICD-10-CM | POA: Diagnosis not present

## 2021-06-25 DIAGNOSIS — E1122 Type 2 diabetes mellitus with diabetic chronic kidney disease: Secondary | ICD-10-CM | POA: Diagnosis not present

## 2021-06-25 DIAGNOSIS — L89893 Pressure ulcer of other site, stage 3: Secondary | ICD-10-CM | POA: Diagnosis not present

## 2021-06-25 DIAGNOSIS — Z87891 Personal history of nicotine dependence: Secondary | ICD-10-CM | POA: Diagnosis not present

## 2021-06-25 DIAGNOSIS — E038 Other specified hypothyroidism: Secondary | ICD-10-CM | POA: Diagnosis not present

## 2021-06-25 DIAGNOSIS — N186 End stage renal disease: Secondary | ICD-10-CM | POA: Diagnosis not present

## 2021-06-25 DIAGNOSIS — Z6841 Body Mass Index (BMI) 40.0 and over, adult: Secondary | ICD-10-CM | POA: Diagnosis not present

## 2021-06-25 DIAGNOSIS — I890 Lymphedema, not elsewhere classified: Secondary | ICD-10-CM | POA: Diagnosis not present

## 2021-06-25 NOTE — Progress Notes (Signed)
EMBREE, BRAWLEY ([id number]) Visit Report for 06/25/2021 HPI Details Patient Name: [name], [name]. Date of Service: 06/25/2021 11:15 AM Medical Record Number: [id number] Patient Account Number: [id number] Date of Birth/Sex: [birth year] (65 y.o. F) Treating RN: Levora Dredge Primary Care Provider: [name] Other Clinician: Referring Provider: [name] Treating Provider/Extender: Tito Dine in Treatment: 3 History of Present Illness HPI Description: 65yo w/ h/o BLE phlebolymphedema, type 2 DM (unknown hemoglobin A1c), and obesity. No PVD. h/o chronic, recurrent BLE calf ulcers. Treated with 4 layer compression. Infrequently uses lymphedema pump. Bilateral lower extremity ulcerations healed as of June 2016. Fitted for custom compression stockings but did not receive them. Patient could not travel for lymphedema consult. She developed recurrent bilateral lower extremity ulcerations in August 2016. She is able to heal these quite quickly with 4 layer elastic compression bandages. However, she is not very compliant using her juxtalite compression garments and tends to develop recurrent ulcerations while using juxtalite. Unna boots were applied this past week. She tolerated this well but performs minimal ambulation. She is scheduled to undergo total hip replacement tomorrow with Dr. Rudene Christians. She is unsure if he is aware of her ulcerations. She is otherwise without complaints. No significant pain. No fever or chills. Moderate clear drainage. Not on any antibiotics. 06/04/2015 -- she did have her hip surgery and has now been in rehabilitation and they're controlling her fluid intake and diuretics to the extent that she has lost 13 pounds of weight and her lower extremity circumference is decreased by 7 cm. 06/18/2015 -- she is still in the rehabilitation facility and continues to be looked after well and has lost a total of 15 pounds and her diabetes is under much better  control. Readmission: 06/03/2021 upon evaluation today patient actually appears to be doing somewhat poorly in regard to a wound on the right posterior lower leg. Patient does have a history of previously having been seen here in the clinic but this predates my time here back in 2017. Subsequently unfortunately right now she is having a significant issue with bleeding in the posterior of her right calf this appears to be a deep tissue injury as developed into an ulceration. She does have a history significant for lymphedema, diabetes mellitus type 2, diabetic neuropathy, hypertension, hypothyroidism, and obesity. She is currently in a skilled nursing facility. The dressing that she had in place during evaluation today was Xeroform followed by significant amount of gauze and this was severely stuck. Upon removal the patient was having significant bleeding she also had significant work done in the hospital in regard to her blood flow and Dr. Lucky Cowboy does look like he has her on 2 blood thinners Plavix and Lovenox. This is probably part of the reason why she is bleeding so significantly. With that being said I do believe that based on what we are seeing currently this is probably can to be something that were getting have to try to get stopped with more than just pressure to the region currently. I think surgery foam is probably can to be necessary. If we can get the stop of the Surgifoam she is probably can have to go to the hospital to try to get this bleeding stopped. 12/30; this is a patient who is currently in a skilled facility. She has wounds on her bilateral calfs likely secondary to severe chronic venous insufficiency and lymphedema the care of these wounds is complicated by the fact that she is on Lovenox and Plavix and  bleeds very frequently with even minimal trauma to these wounds. It would appear that she had a Vaseline gauze on the surface of these in the process of undressing them she bled  freely from especially the right but also the left. I had to go in and use silver nitrate. The skin on her legs is significant for chronic venous stasis and lymphedema. Very dry flaking skin Electronic Signature(s) Signed: 06/25/2021 4:08:28 PM By: Linton Ham MD Entered By: Linton Ham on 06/25/2021 12:30:18 [name] ([id number]) -------------------------------------------------------------------------------- Physical Exam Details Patient Name: [name] Date of Service: 06/25/2021 11:15 AM Medical Record Number: [id number] Patient Account Number: [id number] Date of Birth/Sex: 04/13/1956 (65 y.o. F) Treating RN: Levora Dredge Primary Care Provider: [name] Other Clinician: Referring Provider: [name] Treating Provider/Extender: Tito Dine in Treatment: 3 Constitutional Patient is hypertensive.. Pulse regular and within target range for patient.Marland Kitchen Respirations regular, non-labored and within target range.. Temperature is normal and within the target range for the patient.Marland Kitchen appears in no distress. Notes Wound exam; the wounds do not look too bad in terms of the surface very friable and bleeding freely requiring silver nitrate. This was done without any mechanical debridement by the provider this was simply in wound undressing or manipulation Electronic Signature(s) Signed: 06/25/2021 4:08:28 PM By: Linton Ham MD Entered By: Linton Ham on 06/25/2021 12:32:35 [name] ([id number]) -------------------------------------------------------------------------------- Physician Orders Details Patient Name: [name]. Date of Service: 06/25/2021 11:15 AM Medical Record Number: [id number] Patient Account Number: [id number] Date of Birth/Sex: [birth year] (65 y.o. F) Treating RN: Levora Dredge Primary Care Provider: [name] Other Clinician: Referring Provider: [name] Treating Provider/Extender: Tito Dine in Treatment: 3 Verbal / Phone Orders: No Diagnosis Coding ICD-10 Coding Code Description I89.0 Lymphedema, not elsewhere classified E11.622 Type 2 diabetes mellitus with other skin ulcer E11.40 Type 2 diabetes mellitus with diabetic neuropathy, unspecified L89.893 Pressure ulcer of other site, stage 3 L89.896 Pressure-induced deep tissue damage of other site I10 Essential (primary) hypertension E03.8 Other specified hypothyroidism E66.09 Other obesity due to excess calories Follow-up Appointments Wound #60 Right,Posterior Lower Leg o Return Appointment in 3 weeks. Bathing/ Shower/ Hygiene o Clean wound with Normal Saline or wound cleanser. o No tub bath. Edema Control - Lymphedema / Segmental Compressive Device / Other o Other: - Lymphedema-use ace wraps to help control swelling. Off-Loading o Other: - Put pillows under knees to keep pressure off of calves. Deep tissue injury on posterior calves. Additional Orders / Instructions o Follow Nutritious Diet and Increase Protein Intake o Other: - Patient denies wound on sacrum. If there is a wound on the sacrum, please let us know so we can check it at her next visit. Wound Treatment Wound #60 - Lower Leg Wound Laterality: Right, Posterior Topical: Triamcinolone Acetonide Cream, 0.1%, 15 (g) tube Every Other Day/30 Days Discharge Instructions: Apply as directed by provider. lotion on top Primary Dressing: Xeroform 4x4-HBD (in/in) Every Other Day/30 Days Discharge Instructions: Apply Xeroform 4x4-HBD (in/in) as directed Secondary Dressing: Gauze Every Other Day/30 Days Discharge Instructions: As directed: over xeroform Secured With: 67M Medipore H Soft Cloth Surgical Tape, 2x2 (in/yd) Every Other Day/30 Days Secured With: Coban Cohesive Bandage 4x5 (yds) Stretched Every Other Day/30 Days Discharge Instructions: Apply Coban as directed. Secured With: [employer] or Non-Sterile 6-ply 4.5x4 (yd/yd)  Every Other Day/30 Days Discharge Instructions: Apply Kerlix as directed Wound #61 - Lower Leg Wound Laterality: Left, Posterior Topical: Triamcinolone Acetonide Cream, 0.1%,  15 (g) tube Every Other Day/30 Days Discharge Instructions: Apply as directed by provider. lotion on top Winski, Ardell K. ([id number]) Primary Dressing: Xeroform 4x4-HBD (in/in) Every Other Day/30 Days Discharge Instructions: Apply Xeroform 4x4-HBD (in/in) as directed Secondary Dressing: Gauze Every Other Day/30 Days Discharge Instructions: As directed: over xeroform Secured With: 1M Medipore H Soft Cloth Surgical Tape, 2x2 (in/yd) Every Other Day/30 Days Secured With: Coban Cohesive Bandage 4x5 (yds) Stretched Every Other Day/30 Days Discharge Instructions: Apply Coban as directed. Secured With: [employer] or Non-Sterile 6-ply 4.5x4 (yd/yd) Every Other Day/30 Days Discharge Instructions: Apply Kerlix as directed Wound #62 - Lower Leg Wound Laterality: Left, Midline, Posterior Topical: Triamcinolone Acetonide Cream, 0.1%, 15 (g) tube Every Other Day/30 Days Discharge Instructions: Apply as directed by provider. lotion on top Primary Dressing: Xeroform 4x4-HBD (in/in) Every Other Day/30 Days Discharge Instructions: Apply Xeroform 4x4-HBD (in/in) as directed Secondary Dressing: Gauze Every Other Day/30 Days Discharge Instructions: As directed: over xeroform Secured With: 1M Medipore H Soft Cloth Surgical Tape, 2x2 (in/yd) Every Other Day/30 Days Secured With: Coban Cohesive Bandage 4x5 (yds) Stretched Every Other Day/30 Days Discharge Instructions: Apply Coban as directed. Secured With: [employer] or Non-Sterile 6-ply 4.5x4 (yd/yd) Every Other Day/30 Days Discharge Instructions: Apply Kerlix as directed Electronic Signature(s) Signed: 06/25/2021 4:08:28 PM By: Linton Ham MD Signed: 06/25/2021 4:39:16 PM By: Levora Dredge Entered By: Levora Dredge on 06/25/2021 12:58:38 [name] ([id number]) -------------------------------------------------------------------------------- Problem List Details Patient Name: [name]. Date of Service: 06/25/2021 11:15 AM Medical Record Number: [id number] Patient Account Number: [id number] Date of Birth/Sex: 08/20/1955 (65 y.o. F) Treating RN: Levora Dredge Primary Care Provider: [name] Other Clinician: Referring Provider: [name] Treating Provider/Extender: Tito Dine in Treatment: 3 Active Problems ICD-10 Encounter Code Description Active Date MDM Diagnosis I89.0 Lymphedema, not elsewhere classified 06/03/2021 No Yes E11.622 Type 2 diabetes mellitus with other skin ulcer 06/03/2021 No Yes E11.40 Type 2 diabetes mellitus with diabetic neuropathy, unspecified 06/03/2021 No Yes L89.893 Pressure ulcer of other site, stage 3 06/03/2021 No Yes L89.896 Pressure-induced deep tissue damage of other site 06/03/2021 No Yes I10 Essential (primary) hypertension 06/03/2021 No Yes E03.8 Other specified hypothyroidism 06/03/2021 No Yes E66.09 Other obesity due to excess calories 06/03/2021 No Yes Inactive Problems Resolved Problems Electronic Signature(s) Signed: 06/25/2021 4:08:28 PM By: Linton Ham MD Entered By: Linton Ham on 06/25/2021 12:27:38 [name] ([id number]) -------------------------------------------------------------------------------- Progress Note Details Patient Name: [name]. Date of Service: 06/25/2021 11:15 AM Medical Record Number: [id number] Patient Account Number: [id number] Date of Birth/Sex: 1955-11-10 (65 y.o. F) Treating RN: Levora Dredge Primary Care Provider: [name] Other Clinician: Referring Provider: [name] Treating Provider/Extender: Tito Dine in Treatment: 3 Subjective History of Present Illness (HPI) 65yo w/ h/o BLE phlebolymphedema, type 2 DM (unknown hemoglobin A1c), and obesity. No PVD. h/o chronic, recurrent  BLE calf ulcers. Treated with 4 layer compression. Infrequently uses lymphedema pump. Bilateral lower extremity ulcerations healed as of June 2016. Fitted for custom compression stockings but did not receive them. Patient could not travel for lymphedema consult. She developed recurrent bilateral lower extremity ulcerations in August 2016. She is able to heal these quite quickly with 4 layer elastic compression bandages. However, she is not very compliant using her juxtalite compression garments and tends to develop recurrent ulcerations while using juxtalite. Unna boots were applied this past week. She tolerated this well but performs minimal ambulation. She is scheduled to undergo total hip replacement tomorrow  with Dr. Rudene Christians. She is unsure if he is aware of her ulcerations. She is otherwise without complaints. No significant pain. No fever or chills. Moderate clear drainage. Not on any antibiotics. 06/04/2015 -- she did have her hip surgery and has now been in rehabilitation and they're controlling her fluid intake and diuretics to the extent that she has lost 13 pounds of weight and her lower extremity circumference is decreased by 7 cm. 06/18/2015 -- she is still in the rehabilitation facility and continues to be looked after well and has lost a total of 15 pounds and her diabetes is under much better control. Readmission: 06/03/2021 upon evaluation today patient actually appears to be doing somewhat poorly in regard to a wound on the right posterior lower leg. Patient does have a history of previously having been seen here in the clinic but this predates my time here back in 2017. Subsequently unfortunately right now she is having a significant issue with bleeding in the posterior of her right calf this appears to be a deep tissue injury as developed into an ulceration. She does have a history significant for lymphedema, diabetes mellitus type 2, diabetic neuropathy, hypertension,  hypothyroidism, and obesity. She is currently in a skilled nursing facility. The dressing that she had in place during evaluation today was Xeroform followed by significant amount of gauze and this was severely stuck. Upon removal the patient was having significant bleeding she also had significant work done in the hospital in regard to her blood flow and Dr. Lucky Cowboy does look like he has her on 2 blood thinners Plavix and Lovenox. This is probably part of the reason why she is bleeding so significantly. With that being said I do believe that based on what we are seeing currently this is probably can to be something that were getting have to try to get stopped with more than just pressure to the region currently. I think surgery foam is probably can to be necessary. If we can get the stop of the Surgifoam she is probably can have to go to the hospital to try to get this bleeding stopped. 12/30; this is a patient who is currently in a skilled facility. She has wounds on her bilateral calfs likely secondary to severe chronic venous insufficiency and lymphedema the care of these wounds is complicated by the fact that she is on Lovenox and Plavix and bleeds very frequently with even minimal trauma to these wounds. It would appear that she had a Vaseline gauze on the surface of these in the process of undressing them she bled freely from especially the right but also the left. I had to go in and use silver nitrate. The skin on her legs is significant for chronic venous stasis and lymphedema. Very dry flaking skin Objective Constitutional Patient is hypertensive.. Pulse regular and within target range for patient.Marland Kitchen Respirations regular, non-labored and within target range.. Temperature is normal and within the target range for the patient.Marland Kitchen appears in no distress. Vitals Time Taken: 11:56 AM, Height: 65 in, Weight: 245.4 lbs, BMI: 40.8, Temperature: 97.8 F, Pulse: 64 bpm, Respiratory Rate: 18 breaths/min,  Blood Pressure: 150/81 mmHg. General Notes: Wound exam; the wounds do not look too bad in terms of the surface very friable and bleeding freely requiring silver nitrate. This was done without any mechanical debridement by the provider this was simply in wound undressing or manipulation Integumentary (Hair, Skin) Mcintyre, Genieve K. ([id number]) Wound #60 status is Open. Original cause of wound was Pressure  Injury. The date acquired was: 05/17/2021. The wound has been in treatment 3 weeks. The wound is located on the Right,Posterior Lower Leg. The wound measures 6cm length x 4.5cm width x 0.1cm depth; 21.206cm^2 area and 2.121cm^3 volume. There is Fat Layer (Subcutaneous Tissue) exposed. There is no tunneling or undermining noted. There is a large amount of sanguinous drainage noted. The wound margin is indistinct and nonvisible. There is large (67-100%) friable granulation within the wound bed. There is no necrotic tissue within the wound bed. Wound #61 status is Open. Original cause of wound was Gradually Appeared. The date acquired was: 06/11/2021. The wound is located on the Left,Posterior Lower Leg. The wound measures 9cm length x 4.5cm width x 0.1cm depth; 31.809cm^2 area and 3.181cm^3 volume. There is Fat Layer (Subcutaneous Tissue) exposed. There is a large amount of sanguinous drainage noted. There is large (67-100%) friable granulation within the wound bed. There is no necrotic tissue within the wound bed. Assessment Active Problems ICD-10 Lymphedema, not elsewhere classified Type 2 diabetes mellitus with other skin ulcer Type 2 diabetes mellitus with diabetic neuropathy, unspecified Pressure ulcer of other site, stage 3 Pressure-induced deep tissue damage of other site Essential (primary) hypertension Other specified hypothyroidism Other obesity due to excess calories Plan 1. Unfortunately because of the bleeding I do not think there is much choice about what we put on this which  would either be Vaseline gauze or Xeroform 2. This will need to be changed every second day or perhaps every day if there is sticking on the second day 3. I recommended triamcinolone 0.1% 1-4 in Cetaphil cream liberally to her lower extremities including the dorsal feet with dressing changes. 4. I did not look into the indications for the combination of anticoagulant/antiplatelet drugs she is on however she tells me that I think the Lovenox is due to be stopped on the 10th. Hopefully that will make things easier to try and get these wounds to heal 5. The wound was bleeding fairly freely and spontaneously I use silver nitrate on this for cautery this was not a procedure Electronic Signature(s) Signed: 06/25/2021 4:12:33 PM By: Linton Ham MD Previous Signature: 06/25/2021 4:08:28 PM Version By: Linton Ham MD Entered By: Linton Ham on 06/25/2021 16:11:58 [name] ([id number]) -------------------------------------------------------------------------------- SuperBill Details Patient Name: [name]. Date of Service: 06/25/2021 Medical Record Number: [id number] Patient Account Number: [id number] Date of Birth/Sex: [birth year] (65 y.o. F) Treating RN: Levora Dredge Primary Care Provider: [name] Other Clinician: Referring Provider: [name] Treating Provider/Extender: Tito Dine in Treatment: 3 Diagnosis Coding ICD-10 Codes Code Description I89.0 Lymphedema, not elsewhere classified E11.622 Type 2 diabetes mellitus with other skin ulcer E11.40 Type 2 diabetes mellitus with diabetic neuropathy, unspecified L89.893 Pressure ulcer of other site, stage 3 L89.896 Pressure-induced deep tissue damage of other site I10 Essential (primary) hypertension E03.8 Other specified hypothyroidism E66.09 Other obesity due to excess calories Facility Procedures CPT4 Code: 44010272 Description: 99214 - WOUND CARE VISIT-LEV 4 EST  PT Modifier: Quantity: 1 Physician Procedures CPT4 Code: 5366440 Description: 34742 - WC PHYS LEVEL 4 - EST PT Modifier: Quantity: 1 CPT4 Code: Description: ICD-10 Diagnosis Description I89.0 Lymphedema, not elsewhere classified L89.893 Pressure ulcer of other site, stage 3 E11.622 Type 2 diabetes mellitus with other skin ulcer Modifier: Quantity: Electronic Signature(s) Signed: 06/25/2021 4:38:54 PM By: Levora Dredge Previous Signature: 06/25/2021 4:12:33 PM Version By: Linton Ham MD Previous Signature: 06/25/2021 12:56:39 PM Version By: Levora Dredge Previous Signature: 06/25/2021 4:08:28 PM Version  By: Linton Ham MD Entered By: Levora Dredge on 06/25/2021 16:38:54

## 2021-06-25 NOTE — Progress Notes (Signed)
ACQUANETTA, CABANILLA ([id number]) Visit Report for 06/25/2021 Arrival Information Details Patient Name: EVALINA, TABAK. Date of Service: 06/25/2021 11:15 AM Medical Record Number: [id number] Patient Account Number: [id number] Date of Birth/Sex: 1956/05/05 (65 y.o. F) Treating RN: Levora Dredge Primary Care Amberleigh Gerken: [name] Other Clinician: Referring Deundre Thong: [name] Treating Hersey Maclellan/Extender: Tito Dine in Treatment: 3 Visit Information History Since Last Visit Added or deleted any medications: No Patient Arrived: Wheel Chair Any new allergies or adverse reactions: No Arrival Time: 11:55 Had a fall or experienced change in No Accompanied By: staff activities of daily living that may affect Transfer Assistance: Harrel Lemon Lift risk of falls: Patient Identification Verified: Yes Hospitalized since last visit: No Secondary Verification Process Completed: Yes Has Dressing in Place as Prescribed: Yes Patient Has Alerts: Yes Pain Present Now: No Patient Alerts: Patient on Blood Thinner TYPE II Diabetic Plavix and Lovenox Electronic Signature(s) Signed: 06/25/2021 4:39:16 PM By: Levora Dredge Entered By: Levora Dredge on 06/25/2021 11:56:31 [name] ([id number]) -------------------------------------------------------------------------------- Clinic Level of Care Assessment Details Patient Name: [name] Date of Service: 06/25/2021 11:15 AM Medical Record Number: [id number] Patient Account Number: [id number] Date of Birth/Sex: 01/08/1956 (65 y.o. F) Treating RN: Levora Dredge Primary Care Beldon Nowling: [name] Other Clinician: Referring Jeiden Daughtridge: [name] Treating Jola Critzer/Extender: Tito Dine in Treatment: 3 Clinic Level of Care Assessment Items TOOL 4 Quantity Score X - Use when only an EandM is performed on FOLLOW-UP visit 1 0 ASSESSMENTS - Nursing Assessment / Reassessment X - Reassessment of  Co-morbidities (includes updates in patient status) 1 10 X- 1 5 Reassessment of Adherence to Treatment Plan ASSESSMENTS - Wound and Skin Assessment / Reassessment []  - Simple Wound Assessment / Reassessment - one wound 0 X- 3 5 Complex Wound Assessment / Reassessment - multiple wounds []  - 0 Dermatologic / Skin Assessment (not related to wound area) ASSESSMENTS - Focused Assessment []  - Circumferential Edema Measurements - multi extremities 0 []  - 0 Nutritional Assessment / Counseling / Intervention []  - 0 Lower Extremity Assessment (monofilament, tuning fork, pulses) []  - 0 Peripheral Arterial Disease Assessment (using hand held doppler) ASSESSMENTS - Ostomy and/or Continence Assessment and Care []  - Incontinence Assessment and Management 0 []  - 0 Ostomy Care Assessment and Management (repouching, etc.) PROCESS - Coordination of Care X - Simple Patient / Family Education for ongoing care 1 15 []  - 0 Complex (extensive) Patient / Family Education for ongoing care X- 1 10 Staff obtains Programmer, systems, Records, Test Results / Process Orders []  - 0 Staff telephones HHA, Nursing Homes / Clarify orders / etc []  - 0 Routine Transfer to another Facility (non-emergent condition) []  - 0 Routine Hospital Admission (non-emergent condition) []  - 0 New Admissions / Biomedical engineer / Ordering NPWT, Apligraf, etc. []  - 0 Emergency Hospital Admission (emergent condition) X- 1 10 Simple Discharge Coordination []  - 0 Complex (extensive) Discharge Coordination PROCESS - Special Needs []  - Pediatric / Minor Patient Management 0 []  - 0 Isolation Patient Management []  - 0 Hearing / Language / Visual special needs []  - 0 Assessment of Community assistance (transportation, D/C planning, etc.) []  - 0 Additional assistance / Altered mentation []  - 0 Support Surface(s) Assessment (bed, cushion, seat, etc.) INTERVENTIONS - Wound Cleansing / Measurement Holtry, Tashawnda K. ([id number]) []   - 0 Simple Wound Cleansing - one wound X- 3 5 Complex Wound Cleansing - multiple wounds X- 1 5 Wound Imaging (photographs - any number of wounds) []  - 0 Wound Tracing (  instead of photographs) []  - 0 Simple Wound Measurement - one wound X- 3 5 Complex Wound Measurement - multiple wounds INTERVENTIONS - Wound Dressings X - Small Wound Dressing one or multiple wounds 3 10 []  - 0 Medium Wound Dressing one or multiple wounds []  - 0 Large Wound Dressing one or multiple wounds []  - 0 Application of Medications - topical []  - 0 Application of Medications - injection INTERVENTIONS - Miscellaneous []  - External ear exam 0 []  - 0 Specimen Collection (cultures, biopsies, blood, body fluids, etc.) []  - 0 Specimen(s) / Culture(s) sent or taken to Lab for analysis []  - 0 Patient Transfer (multiple staff / Civil Service fast streamer / Similar devices) []  - 0 Simple Staple / Suture removal (25 or less) []  - 0 Complex Staple / Suture removal (26 or more) []  - 0 Hypo / Hyperglycemic Management (close monitor of Blood Glucose) []  - 0 Ankle / Brachial Index (ABI) - do not check if billed separately X- 1 5 Vital Signs Has the patient been seen at the hospital within the last three years: Yes Total Score: 135 Level Of Care: New/Established - Level 4 Electronic Signature(s) Signed: 06/25/2021 4:39:16 PM By: Levora Dredge Entered By: Levora Dredge on 06/25/2021 16:38:42 [name] ([id number]) -------------------------------------------------------------------------------- Encounter Discharge Information Details Patient Name: [name]. Date of Service: 06/25/2021 11:15 AM Medical Record Number: [id number] Patient Account Number: [id number] Date of Birth/Sex: December 29, 1955 (65 y.o. F) Treating RN: Levora Dredge Primary Care Betrice Wanat: [name] Other Clinician: Referring Aydan Levitz: [name] Treating Petr Bontempo/Extender: Tito Dine in Treatment: 3 Encounter  Discharge Information Items Discharge Condition: Stable Ambulatory Status: Wheelchair Discharge Destination: Skilled Nursing Facility Orders Sent: Yes Transportation: Other Accompanied By: staff Schedule Follow-up Appointment: Yes Clinical Summary of Care: Electronic Signature(s) Signed: 06/25/2021 12:59:23 PM By: Levora Dredge Entered By: Levora Dredge on 06/25/2021 12:59:23 [name] ([id number]) -------------------------------------------------------------------------------- Lower Extremity Assessment Details Patient Name: [name] Date of Service: 06/25/2021 11:15 AM Medical Record Number: [id number] Patient Account Number: [id number] Date of Birth/Sex: [birth year] (65 y.o. F) Treating RN: Levora Dredge Primary Care Meeyah Ovitt: [name] Other Clinician: Referring Lakisha Peyser: [name] Treating Tkeya Stencil/Extender: Tito Dine in Treatment: 3 Vascular Assessment Pulses: Dorsalis Pedis Palpable: [Left:Yes] [Right:Yes] Notes unable to do full assessment due to patient bleeding at wound sites and needing to hold pressure. Electronic Signature(s) Signed: 06/25/2021 12:51:30 PM By: Levora Dredge Entered By: Levora Dredge on 06/25/2021 12:51:30 [name] ([id number]) -------------------------------------------------------------------------------- Multi Wound Chart Details Patient Name: [name] Date of Service: 06/25/2021 11:15 AM Medical Record Number: [id number] Patient Account Number: [id number] Date of Birth/Sex: May 27, 1956 (65 y.o. F) Treating RN: Levora Dredge Primary Care Passion Lavin: [name] Other Clinician: Referring Tashe Purdon: [name] Treating Taniesha Glanz/Extender: Ricard Dillon Weeks in Treatment: 3 Vital Signs Height(in): 65 Pulse(bpm): 64 Weight(lbs): 245.4 Blood Pressure(mmHg): 150/81 Body Mass Index(BMI): 41 Temperature(F): 97.8 Respiratory Rate(breaths/min): 18 Photos: Wound  Location: Right, Posterior Lower Leg Left, Posterior Lower Leg Left, Midline, Posterior Lower Leg Wounding Event: Pressure Injury Gradually Appeared Gradually Appeared Primary Etiology: Pressure Ulcer Lymphedema Lymphedema Comorbid History: Lymphedema, Hypertension, Lymphedema, Hypertension, Lymphedema, Hypertension, Peripheral Arterial Disease, Peripheral Arterial Disease, Peripheral Arterial Disease, Peripheral Venous Disease, Type II Peripheral Venous Disease, Type II Peripheral Venous Disease, Type II Diabetes, End Stage Renal Disease, Diabetes, End Stage Renal Disease, Diabetes, End Stage Renal Disease, Osteoarthritis, Neuropathy Osteoarthritis, Neuropathy Osteoarthritis, Neuropathy Date Acquired: 05/17/2021 06/11/2021 06/11/2021 Weeks of Treatment: 3 0 0 Wound Status: Open Open Open Measurements  L x W x D (cm) 6x4.5x0.1 9x4.5x0.1 1.7x0.7x0.1 Area (cm) : 21.206 31.809 0.935 Volume (cm) : 2.121 3.181 0.093 % Reduction in Area: -3653.30% 0.00% N/A % Reduction in Volume: -3621.10% 0.00% N/A Classification: Category/Stage III Full Thickness Without Exposed Full Thickness Without Exposed Support Structures Support Structures Exudate Amount: Large Large Large Exudate Type: Sanguinous Sanguinous Sanguinous Exudate Color: red red red Wound Margin: Indistinct, nonvisible N/A N/A Granulation Amount: Large (67-100%) Large (67-100%) Large (67-100%) Granulation Quality: Friable Friable Friable Necrotic Amount: None Present (0%) None Present (0%) None Present (0%) Exposed Structures: Fat Layer (Subcutaneous Tissue): Fat Layer (Subcutaneous Tissue): Fat Layer (Subcutaneous Tissue): Yes Yes Yes Fascia: No Tendon: No Muscle: No Joint: No Bone: No Epithelialization: None N/A None Procedures Performed: CHEM CAUT GRANULATION TISS CHEM CAUT GRANULATION TISS CHEM CAUT GRANULATION TISS Treatment Notes Electronic Signature(s) Signed: 06/25/2021 12:55:15 PM By: Levora Dredge Entered By:  Levora Dredge on 06/25/2021 12:55:15 [name] ([id number]) [name] ([id number]) -------------------------------------------------------------------------------- Multi-Disciplinary Care Plan Details Patient Name: [name]. Date of Service: 06/25/2021 11:15 AM Medical Record Number: [id number] Patient Account Number: [id number] Date of Birth/Sex: 1956-04-06 (65 y.o. F) Treating RN: Levora Dredge Primary Care Nadja Lina: [name] Other Clinician: Referring Dodger Sinning: [name] Treating Mohamad Bruso/Extender: Tito Dine in Treatment: 3 Active Inactive Orientation to the Wound Care Program Nursing Diagnoses: Knowledge deficit related to the wound healing center program Goals: Patient/caregiver will verbalize understanding of the [facility] Program Date Initiated: 06/03/2021 Target Resolution Date: 06/03/2021 Goal Status: Active Interventions: Provide education on orientation to the wound center Notes: Wound/Skin Impairment Nursing Diagnoses: Impaired tissue integrity Knowledge deficit related to smoking impact on wound healing Knowledge deficit related to ulceration/compromised skin integrity Goals: Patient/caregiver will verbalize understanding of skin care regimen Date Initiated: 06/03/2021 Target Resolution Date: 06/03/2021 Goal Status: Active Interventions: Assess ulceration(s) every visit Provide education on ulcer and skin care Treatment Activities: Skin care regimen initiated : 06/03/2021 Topical wound management initiated : 06/03/2021 Notes: Electronic Signature(s) Signed: 06/25/2021 12:54:54 PM By: Levora Dredge Previous Signature: 06/25/2021 12:51:37 PM Version By: Levora Dredge Entered By: Levora Dredge on 06/25/2021 12:54:54 [name] ([id number]) -------------------------------------------------------------------------------- Pain Assessment Details Patient Name: [name]. Date of  Service: 06/25/2021 11:15 AM Medical Record Number: [id number] Patient Account Number: [id number] Date of Birth/Sex: 17-Nov-1955 (65 y.o. F) Treating RN: Levora Dredge Primary Care Chapin Arduini: [name] Other Clinician: Referring Seyed Heffley: [name] Treating Ryshawn Sanzone/Extender: Tito Dine in Treatment: 3 Active Problems Location of Pain Severity and Description of Pain Patient Has Paino No Site Locations Rate the pain. Current Pain Level: 0 Pain Management and Medication Current Pain Management: Electronic Signature(s) Signed: 06/25/2021 4:39:16 PM By: Levora Dredge Entered By: Levora Dredge on 06/25/2021 11:57:06 [name] ([id number]) -------------------------------------------------------------------------------- Patient/Caregiver Education Details Patient Name: [name] Date of Service: 06/25/2021 11:15 AM Medical Record Number: [id number] Patient Account Number: [id number] Date of Birth/Gender: 08-Jun-1956 (65 y.o. F) Treating RN: Levora Dredge Primary Care Physician: [name] Other Clinician: Referring Physician: [name] Treating Physician/Extender: Tito Dine in Treatment: 3 Education Assessment Education Provided To: Patient Education Topics Provided Wound/Skin Impairment: Handouts: Caring for Your Ulcer Methods: Explain/Verbal Responses: State content correctly Electronic Signature(s) Signed: 06/25/2021 4:39:16 PM By: Levora Dredge Entered By: Levora Dredge on 06/25/2021 12:56:55 [name] ([id number]) -------------------------------------------------------------------------------- Wound Assessment Details Patient Name: [name]. Date of Service: 06/25/2021 11:15 AM Medical Record Number: [id number] Patient Account Number: [id number] Date of Birth/Sex: 1956/06/05 (65 y.o.  F) Treating RN: Levora Dredge Primary Care Mylin Hirano: [name] Other Clinician: Referring Zain Bingman:  [name] Treating Nevaeha Finerty/Extender: Tito Dine in Treatment: 3 Wound Status Wound Number: 60 Primary Pressure Ulcer Etiology: Wound Location: Right, Posterior Lower Leg Wound Open Wounding Event: Pressure Injury Status: Date Acquired: 05/17/2021 Comorbid Lymphedema, Hypertension, Peripheral Arterial Disease, Weeks Of Treatment: 3 History: Peripheral Venous Disease, Type II Diabetes, End Stage Clustered Wound: No Renal Disease, Osteoarthritis, Neuropathy Photos Wound Measurements Length: (cm) 6 % Redu Width: (cm) 4.5 % Redu Depth: (cm) 0.1 Epithe Area: (cm) 21.206 [place] Volume: (cm) 2.121 Under ction in Area: -3653.3% ction in Volume: -3621.1% lialization: None ling: No mining: No Wound Description Classification: Category/Stage III Foul Wound Margin: Indistinct, nonvisible Sloug Exudate Amount: Large Exudate Type: Sanguinous Exudate Color: red Odor After Cleansing: No h/Fibrino No Wound Bed Granulation Amount: Large (67-100%) Exposed Structure Granulation Quality: Friable Fascia Exposed: No Necrotic Amount: None Present (0%) Fat Layer (Subcutaneous Tissue) Exposed: Yes Tendon Exposed: No Muscle Exposed: No Joint Exposed: No Bone Exposed: No Treatment Notes Wound #60 (Lower Leg) Wound Laterality: Right, Posterior Cleanser Peri-Wound Care Topical Glastetter, Arlett K. ([id number]) Triamcinolone Acetonide Cream, 0.1%, 15 (g) tube Discharge Instruction: Apply as directed by Braeton Wolgamott. lotion on top Primary Dressing Xeroform 4x4-HBD (in/in) Discharge Instruction: Apply Xeroform 4x4-HBD (in/in) as directed Secondary Dressing Gauze Discharge Instruction: As directed: over xeroform Secured With 47M [place] Surgical Tape, 2x2 (in/yd) Coban Cohesive Bandage 4x5 (yds) Stretched Discharge Instruction: Apply Coban as directed. Kerlix Roll Sterile or Non-Sterile 6-ply 4.5x4 (yd/yd) Discharge Instruction: Apply Kerlix as  directed Compression Wrap Compression Stockings Add-Ons Electronic Signature(s) Signed: 06/25/2021 12:54:00 PM By: Levora Dredge Entered By: Levora Dredge on 06/25/2021 12:53:59 [name] ([id number]) -------------------------------------------------------------------------------- Wound Assessment Details Patient Name: [name]. Date of Service: 06/25/2021 11:15 AM Medical Record Number: [id number] Patient Account Number: [id number] Date of Birth/Sex: [birth year] (65 y.o. F) Treating RN: Levora Dredge Primary Care Cricket Goodlin: [name] Other Clinician: Referring Searra Carnathan: [name] Treating Devaney Segers/Extender: Tito Dine in Treatment: 3 Wound Status Wound Number: 61 Primary Lymphedema Etiology: Wound Location: Left, Posterior Lower Leg Wound Open Wounding Event: Gradually Appeared Status: Date Acquired: 06/11/2021 Comorbid Lymphedema, Hypertension, Peripheral Arterial Disease, Weeks Of Treatment: 0 History: Peripheral Venous Disease, Type II Diabetes, End Stage Clustered Wound: No Renal Disease, Osteoarthritis, Neuropathy Photos Wound Measurements Length: (cm) 9 Width: (cm) 4.5 Depth: (cm) 0.1 Area: (cm) 31.809 Volume: (cm) 3.181 % Reduction in Area: 0% % Reduction in Volume: 0% Wound Description Classification: Full Thickness Without Exposed Support Structures Exudate Amount: Large Exudate Type: Sanguinous Exudate Color: red Foul Odor After Cleansing: No Slough/Fibrino No Wound Bed Granulation Amount: Large (67-100%) Exposed Structure Granulation Quality: Friable Fat Layer (Subcutaneous Tissue) Exposed: Yes Necrotic Amount: None Present (0%) Treatment Notes Wound #61 (Lower Leg) Wound Laterality: Left, Posterior Cleanser Peri-Wound Care Topical Triamcinolone Acetonide Cream, 0.1%, 15 (g) tube Discharge Instruction: Apply as directed by Mareesa Gathright. lotion on top Primary Dressing Xeroform 4x4-HBD (in/in) Woodson,  Geraldina K. ([id number]) Discharge Instruction: Apply Xeroform 4x4-HBD (in/in) as directed Secondary Dressing Gauze Discharge Instruction: As directed: over xeroform Secured With 47M [place] Surgical Tape, 2x2 (in/yd) Coban Cohesive Bandage 4x5 (yds) Stretched Discharge Instruction: Apply Coban as directed. Kerlix Roll Sterile or Non-Sterile 6-ply 4.5x4 (yd/yd) Discharge Instruction: Apply Kerlix as directed Compression Wrap Compression Stockings Add-Ons Electronic Signature(s) Signed: 06/25/2021 12:54:35 PM By: Levora Dredge Entered By: Levora Dredge on 06/25/2021 12:54:33 [name] ([id number]) -------------------------------------------------------------------------------- Wound  Assessment Details Patient Name: AMARILYS, LYLES. Date of Service: 06/25/2021 11:15 AM Medical Record Number: [id number] Patient Account Number: [id number] Date of Birth/Sex: 1956-05-27 (65 y.o. F) Treating RN: Levora Dredge Primary Care Jeily Guthridge: [name] Other Clinician: Referring Davied Nocito: [name] Treating Deira Shimer/Extender: Tito Dine in Treatment: 3 Wound Status Wound Number: 62 Primary Lymphedema Etiology: Wound Location: Left, Midline, Posterior Lower Leg Wound Open Wounding Event: Gradually Appeared Status: Date Acquired: 06/11/2021 Comorbid Lymphedema, Hypertension, Peripheral Arterial Disease, Weeks Of Treatment: 0 History: Peripheral Venous Disease, Type II Diabetes, End Stage Clustered Wound: No Renal Disease, Osteoarthritis, Neuropathy Photos Wound Measurements Length: (cm) 1.7 Width: (cm) 0.7 Depth: (cm) 0.1 Area: (cm) 0.935 Volume: (cm) 0.093 % Reduction in Area: % Reduction in Volume: Epithelialization: None Tunneling: No Undermining: No Wound Description Classification: Full Thickness Without Exposed Support Structures Exudate Amount: Large Exudate Type: Sanguinous Exudate Color: red Foul Odor After Cleansing:  No Slough/Fibrino No Wound Bed Granulation Amount: Large (67-100%) Exposed Structure Granulation Quality: Friable Fat Layer (Subcutaneous Tissue) Exposed: Yes Necrotic Amount: None Present (0%) Treatment Notes Wound #62 (Lower Leg) Wound Laterality: Left, Midline, Posterior Cleanser Peri-Wound Care Topical Triamcinolone Acetonide Cream, 0.1%, 15 (g) tube Discharge Instruction: Apply as directed by Steffani Dionisio. lotion on top Primary Dressing Xeroform 4x4-HBD (in/in) Labuda, Kaylynne K. ([id number]) Discharge Instruction: Apply Xeroform 4x4-HBD (in/in) as directed Secondary Dressing Gauze Discharge Instruction: As directed: over xeroform Secured With 4M [place] Surgical Tape, 2x2 (in/yd) Coban Cohesive Bandage 4x5 (yds) Stretched Discharge Instruction: Apply Coban as directed. Kerlix Roll Sterile or Non-Sterile 6-ply 4.5x4 (yd/yd) Discharge Instruction: Apply Kerlix as directed Compression Wrap Compression Stockings Add-Ons Electronic Signature(s) Signed: 06/25/2021 4:39:16 PM By: Levora Dredge Entered By: Levora Dredge on 06/25/2021 12:34:56 [name] ([id number]) -------------------------------------------------------------------------------- Vitals Details Patient Name: [name] Date of Service: 06/25/2021 11:15 AM Medical Record Number: [id number] Patient Account Number: [id number] Date of Birth/Sex: 04-14-1956 (65 y.o. F) Treating RN: Levora Dredge Primary Care Karia Ehresman: [name] Other Clinician: Referring Jaquae Rieves: [name] Treating Cherrell Maybee/Extender: Tito Dine in Treatment: 3 Vital Signs Time Taken: 11:56 Temperature (F): 97.8 Height (in): 65 Pulse (bpm): 64 Weight (lbs): 245.4 Respiratory Rate (breaths/min): 18 Body Mass Index (BMI): 40.8 Blood Pressure (mmHg): 150/81 Reference Range: 80 - 120 mg / dl Electronic Signature(s) Signed: 06/25/2021 4:39:16 PM By: Levora Dredge Entered By: Levora Dredge on 06/25/2021 11:56:55

## 2021-06-30 DIAGNOSIS — L97523 Non-pressure chronic ulcer of other part of left foot with necrosis of muscle: Secondary | ICD-10-CM | POA: Diagnosis not present

## 2021-06-30 DIAGNOSIS — E1142 Type 2 diabetes mellitus with diabetic polyneuropathy: Secondary | ICD-10-CM | POA: Diagnosis not present

## 2021-07-02 DIAGNOSIS — R69 Illness, unspecified: Secondary | ICD-10-CM | POA: Diagnosis not present

## 2021-07-02 DIAGNOSIS — F4321 Adjustment disorder with depressed mood: Secondary | ICD-10-CM | POA: Diagnosis not present

## 2021-07-06 DIAGNOSIS — I10 Essential (primary) hypertension: Secondary | ICD-10-CM | POA: Diagnosis not present

## 2021-07-06 DIAGNOSIS — E119 Type 2 diabetes mellitus without complications: Secondary | ICD-10-CM | POA: Diagnosis not present

## 2021-07-09 DIAGNOSIS — E1151 Type 2 diabetes mellitus with diabetic peripheral angiopathy without gangrene: Secondary | ICD-10-CM | POA: Diagnosis not present

## 2021-07-09 DIAGNOSIS — G894 Chronic pain syndrome: Secondary | ICD-10-CM | POA: Diagnosis not present

## 2021-07-09 DIAGNOSIS — I110 Hypertensive heart disease with heart failure: Secondary | ICD-10-CM | POA: Diagnosis not present

## 2021-07-16 ENCOUNTER — Encounter: Payer: Medicare HMO | Attending: Internal Medicine | Admitting: Internal Medicine

## 2021-07-16 ENCOUNTER — Other Ambulatory Visit: Payer: Self-pay

## 2021-07-16 DIAGNOSIS — I890 Lymphedema, not elsewhere classified: Secondary | ICD-10-CM | POA: Insufficient documentation

## 2021-07-16 DIAGNOSIS — E11622 Type 2 diabetes mellitus with other skin ulcer: Secondary | ICD-10-CM | POA: Diagnosis not present

## 2021-07-16 DIAGNOSIS — E1140 Type 2 diabetes mellitus with diabetic neuropathy, unspecified: Secondary | ICD-10-CM | POA: Diagnosis not present

## 2021-07-16 DIAGNOSIS — E038 Other specified hypothyroidism: Secondary | ICD-10-CM | POA: Insufficient documentation

## 2021-07-16 DIAGNOSIS — I10 Essential (primary) hypertension: Secondary | ICD-10-CM | POA: Diagnosis not present

## 2021-07-16 DIAGNOSIS — E668 Other obesity: Secondary | ICD-10-CM | POA: Insufficient documentation

## 2021-07-16 DIAGNOSIS — L97212 Non-pressure chronic ulcer of right calf with fat layer exposed: Secondary | ICD-10-CM | POA: Diagnosis not present

## 2021-07-16 DIAGNOSIS — L97422 Non-pressure chronic ulcer of left heel and midfoot with fat layer exposed: Secondary | ICD-10-CM | POA: Diagnosis not present

## 2021-07-16 DIAGNOSIS — L97222 Non-pressure chronic ulcer of left calf with fat layer exposed: Secondary | ICD-10-CM | POA: Diagnosis not present

## 2021-07-16 DIAGNOSIS — L89893 Pressure ulcer of other site, stage 3: Secondary | ICD-10-CM | POA: Insufficient documentation

## 2021-07-16 DIAGNOSIS — L89896 Pressure-induced deep tissue damage of other site: Secondary | ICD-10-CM | POA: Insufficient documentation

## 2021-07-16 NOTE — Progress Notes (Signed)
[name], [name] ([id number]) Visit Report for 07/16/2021 HPI Details Patient Name: [name], [name]. Date of Service: 07/16/2021 10:30 AM Medical Record Number: [id number] Patient Account Number: [id number] Date of Birth/Sex: 08-04-1955 (65 y.o. F) Treating RN: Levora Dredge Primary Care Provider: [name] Other Clinician: Referring Provider: [name] Treating Provider/Extender: Tito Dine in Treatment: 6 History of Present Illness HPI Description: 66yo w/ h/o BLE phlebolymphedema, type 2 DM (unknown hemoglobin A1c), and obesity. No PVD. h/o chronic, recurrent BLE calf ulcers. Treated with 4 layer compression. Infrequently uses lymphedema pump. Bilateral lower extremity ulcerations healed as of June 2016. Fitted for custom compression stockings but did not receive them. Patient could not travel for lymphedema consult. She developed recurrent bilateral lower extremity ulcerations in August 2016. She is able to heal these quite quickly with 4 layer elastic compression bandages. However, she is not very compliant using her juxtalite compression garments and tends to develop recurrent ulcerations while using juxtalite. Unna boots were applied this past week. She tolerated this well but performs minimal ambulation. She is scheduled to undergo total hip replacement tomorrow with Dr. Rudene Christians. She is unsure if he is aware of her ulcerations. She is otherwise without complaints. No significant pain. No fever or chills. Moderate clear drainage. Not on any antibiotics. 06/04/2015 -- she did have her hip surgery and has now been in rehabilitation and they're controlling her fluid intake and diuretics to the extent that she has lost 13 pounds of weight and her lower extremity circumference is decreased by 7 cm. 06/18/2015 -- she is still in the rehabilitation facility and continues to be looked after well and has lost a total of 15 pounds and her diabetes is under much better  control. Readmission: 06/03/2021 upon evaluation today patient actually appears to be doing somewhat poorly in regard to a wound on the right posterior lower leg. Patient does have a history of previously having been seen here in the clinic but this predates my time here back in 2017. Subsequently unfortunately right now she is having a significant issue with bleeding in the posterior of her right calf this appears to be a deep tissue injury as developed into an ulceration. She does have a history significant for lymphedema, diabetes mellitus type 2, diabetic neuropathy, hypertension, hypothyroidism, and obesity. She is currently in a skilled nursing facility. The dressing that she had in place during evaluation today was Xeroform followed by significant amount of gauze and this was severely stuck. Upon removal the patient was having significant bleeding she also had significant work done in the hospital in regard to her blood flow and Dr. Lucky Cowboy does look like he has her on 2 blood thinners Plavix and Lovenox. This is probably part of the reason why she is bleeding so significantly. With that being said I do believe that based on what we are seeing currently this is probably can to be something that were getting have to try to get stopped with more than just pressure to the region currently. I think surgery foam is probably can to be necessary. If we can get the stop of the Surgifoam she is probably can have to go to the hospital to try to get this bleeding stopped. 12/30; this is a patient who is currently in a skilled facility. She has wounds on her bilateral calfs likely secondary to severe chronic venous insufficiency and lymphedema the care of these wounds is complicated by the fact that she is on Lovenox and Plavix and  bleeds very frequently with even minimal trauma to these wounds. It would appear that she had a Vaseline gauze on the surface of these in the process of undressing them she bled  freely from especially the right but also the left. I had to go in and use silver nitrate. The skin on her legs is significant for chronic venous stasis and lymphedema. Very dry flaking skin 07/16/2021; I actually was a patent person who saw this woman 3 weeks ago. She is a nursing facility has chronic venous wounds and bilateral lower extremity wounds. We use Xeroform under compression. The time she was on Lovenox and Plavix I thought I had an excellent reason for these wounds to bleed so freely. Since then the Lovenox has been stopped and should be well out of her system but she is still on Plavix. She still has very friable bleeding wounds on the left lateral left Posterior and right Posterior lower extremity.These believe very freely when we take off the dressing Also she has a left heel wound which when we saw her last time was a surgical wound however I gather we have been asked to follow this as well. I am not exactly sure at this time what her surgery was but she comes in with a wound VAC on this and they are apparently changing it at the nursing home. Electronic Signature(s) Signed: 07/16/2021 3:34:05 PM By: Linton Ham MD Entered By: Linton Ham on 07/16/2021 12:18:41 [name] ([id number]) -------------------------------------------------------------------------------- Physical Exam Details Patient Name: [name] Date of Service: 07/16/2021 10:30 AM Medical Record Number: [id number] Patient Account Number: [id number] Date of Birth/Sex: October 20, 1955 (65 y.o. F) Treating RN: Levora Dredge Primary Care Provider: [name] Other Clinician: Referring Provider: [name] Treating Provider/Extender: Tito Dine in Treatment: 6 Constitutional Patient is hypertensive.. Pulse regular and within target range for patient.Marland Kitchen Respirations regular, non-labored and within target range.. Temperature is normal and within the target range for the patient.Marland Kitchen  appears in no distress. Notes Wound exam; very friable bleeding surfaces on all of her wounds. Any attempt to wipe them off simply seems to start the bleeding all over again when the clots are removed. Her skin on her lower extremities looks a lot better than the last time I saw this certainly much less erythema we use triamcinolone. On the left plantar heel a wound VAC was in place. The granulation tissue looks healthy here there is not as much bleeding. I am uncertain about the progress with this. There is not much depth however. Some erythema around that wound but I did I am not convinced there is active infection Electronic Signature(s) Signed: 07/16/2021 3:34:05 PM By: Linton Ham MD Entered By: Linton Ham on 07/16/2021 12:21:19 [name] ([id number]) -------------------------------------------------------------------------------- Physician Orders Details Patient Name: [name]. Date of Service: 07/16/2021 10:30 AM Medical Record Number: [id number] Patient Account Number: [id number] Date of Birth/Sex: Mar 26, 1956 (65 y.o. F) Treating RN: Cornell Barman Primary Care Provider: [name] Other Clinician: Referring Provider: [name] Treating Provider/Extender: Tito Dine in Treatment: 6 Verbal / Phone Orders: No Diagnosis Coding Follow-up Appointments Wound #60 Right,Posterior Lower Leg o Return Appointment in 3 weeks. Bathing/ Shower/ Hygiene o Clean wound with Normal Saline or wound cleanser. o No tub bath. Edema Control - Lymphedema / Segmental Compressive Device / Other o Other: - Lymphedema-use ace wraps to help control swelling. Off-Loading o Other: - Put pillows under knees to keep pressure off of calves. Deep tissue injury  on posterior calves. Additional Orders / Instructions o Follow Nutritious Diet and Increase Protein Intake o Other: - Patient denies wound on sacrum. If there is a wound on the sacrum, please let us  know so we can check it at her next visit. Negative Pressure Wound Therapy Wound #63 Left Calcaneus o Wound VAC settings at 149mmHg continuous pressure. Use foam to wound cavity. Please order WHITE foam to fill any tunnel/s and/or undermining when necessary. Change VAC dressing 3 X WEEK. Change canister as indicated when full. - to be re applied at [facility] Nurse may d/c VAC for s/s of increased infection, significant wound regression, or uncontrolled drainage. Garner at [phone]. o Apply contact layer over base of wound. Wound Treatment Wound #60 - Lower Leg Wound Laterality: Right, Posterior Topical: Triamcinolone Acetonide Cream, 0.1%, 15 (g) tube Every Other Day/30 Days Discharge Instructions: Apply as directed by provider. lotion on top Primary Dressing: Vaseline Petrolatum Gauze Strip, 3x9 (in/in) Every Other Day/30 Days Secondary Dressing: ABD Pad 5x9 (in/in) Every Other Day/30 Days Discharge Instructions: Cover with ABD pad Secured With: 84M Medipore H Soft Cloth Surgical Tape, 2x2 (in/yd) Every Other Day/30 Days Secured With: Coban Cohesive Bandage 4x5 (yds) Stretched Every Other Day/30 Days Discharge Instructions: Apply Coban as directed. Secured With: [employer] or Non-Sterile 6-ply 4.5x4 (yd/yd) Every Other Day/30 Days Discharge Instructions: Apply Kerlix as directed Wound #61 - Lower Leg Wound Laterality: Left, Posterior Topical: Triamcinolone Acetonide Cream, 0.1%, 15 (g) tube Every Other Day/30 Days Discharge Instructions: Apply as directed by provider. lotion on top Primary Dressing: Vaseline Impregnated Gauze Dressing, 3x9 (in/in) Every Other Day/30 Days Secondary Dressing: ABD Pad 5x9 (in/in) Every Other Day/30 Days Discharge Instructions: Cover with ABD pad [place], [name] ([id number]) Secured With: 84M [place] Surgical Tape, 2x2 (in/yd) Every Other Day/30 Days Secured With: Coban Cohesive Bandage 4x5  (yds) Stretched Every Other Day/30 Days Discharge Instructions: Apply Coban as directed. Secured With: [employer] or Non-Sterile 6-ply 4.5x4 (yd/yd) Every Other Day/30 Days Discharge Instructions: Apply Kerlix as directed Wound #62 - Lower Leg Wound Laterality: Left, Midline, Posterior Topical: Triamcinolone Acetonide Cream, 0.1%, 15 (g) tube Every Other Day/30 Days Discharge Instructions: Apply as directed by provider. lotion on top Primary Dressing: Vaseline Impregnated Gauze Dressing, 3x9 (in/in) Every Other Day/30 Days Secondary Dressing: ABD Pad 5x9 (in/in) Every Other Day/30 Days Discharge Instructions: Cover with ABD pad Secured With: 84M Medipore H Soft Cloth Surgical Tape, 2x2 (in/yd) Every Other Day/30 Days Secured With: Coban Cohesive Bandage 4x5 (yds) Stretched Every Other Day/30 Days Discharge Instructions: Apply Coban as directed. Secured With: [employer] or Non-Sterile 6-ply 4.5x4 (yd/yd) Every Other Day/30 Days Discharge Instructions: Apply Kerlix as directed Electronic Signature(s) Signed: 07/16/2021 2:57:14 PM By: Gretta Cool, BSN, RN, CWS, Kim RN, BSN Signed: 07/16/2021 3:34:05 PM By: Linton Ham MD Entered By: Gretta Cool, BSN, RN, CWS, Kim on 07/16/2021 11:47:49 [name] ([id number]) -------------------------------------------------------------------------------- Problem List Details Patient Name: [name]. Date of Service: 07/16/2021 10:30 AM Medical Record Number: [id number] Patient Account Number: [id number] Date of Birth/Sex: 1955-12-22 (65 y.o. F) Treating RN: Levora Dredge Primary Care Provider: [name] Other Clinician: Referring Provider: [name] Treating Provider/Extender: Tito Dine in Treatment: 6 Active Problems ICD-10 Encounter Code Description Active Date MDM Diagnosis I89.0 Lymphedema, not elsewhere classified 06/03/2021 No Yes E11.622 Type 2 diabetes mellitus with other skin ulcer 06/03/2021 No  Yes E11.40 Type 2 diabetes mellitus with  diabetic neuropathy, unspecified 06/03/2021 No Yes L89.893 Pressure ulcer of other site, stage 3 06/03/2021 No Yes L89.896 Pressure-induced deep tissue damage of other site 06/03/2021 No Yes I10 Essential (primary) hypertension 06/03/2021 No Yes E03.8 Other specified hypothyroidism 06/03/2021 No Yes E66.09 Other obesity due to excess calories 06/03/2021 No Yes Inactive Problems Resolved Problems Electronic Signature(s) Signed: 07/16/2021 3:34:05 PM By: Linton Ham MD Entered By: Linton Ham on 07/16/2021 12:14:12 [name] ([id number]) -------------------------------------------------------------------------------- Progress Note Details Patient Name: [name]. Date of Service: 07/16/2021 10:30 AM Medical Record Number: [id number] Patient Account Number: [id number] Date of Birth/Sex: 1955-08-01 (65 y.o. F) Treating RN: Levora Dredge Primary Care Provider: [name] Other Clinician: Referring Provider: [name] Treating Provider/Extender: Tito Dine in Treatment: 6 Subjective History of Present Illness (HPI) 66yo w/ h/o BLE phlebolymphedema, type 2 DM (unknown hemoglobin A1c), and obesity. No PVD. h/o chronic, recurrent BLE calf ulcers. Treated with 4 layer compression. Infrequently uses lymphedema pump. Bilateral lower extremity ulcerations healed as of June 2016. Fitted for custom compression stockings but did not receive them. Patient could not travel for lymphedema consult. She developed recurrent bilateral lower extremity ulcerations in August 2016. She is able to heal these quite quickly with 4 layer elastic compression bandages. However, she is not very compliant using her juxtalite compression garments and tends to develop recurrent ulcerations while using juxtalite. Unna boots were applied this past week. She tolerated this well but performs minimal ambulation. She is scheduled to undergo total  hip replacement tomorrow with Dr. Rudene Christians. She is unsure if he is aware of her ulcerations. She is otherwise without complaints. No significant pain. No fever or chills. Moderate clear drainage. Not on any antibiotics. 06/04/2015 -- she did have her hip surgery and has now been in rehabilitation and they're controlling her fluid intake and diuretics to the extent that she has lost 13 pounds of weight and her lower extremity circumference is decreased by 7 cm. 06/18/2015 -- she is still in the rehabilitation facility and continues to be looked after well and has lost a total of 15 pounds and her diabetes is under much better control. Readmission: 06/03/2021 upon evaluation today patient actually appears to be doing somewhat poorly in regard to a wound on the right posterior lower leg. Patient does have a history of previously having been seen here in the clinic but this predates my time here back in 2017. Subsequently unfortunately right now she is having a significant issue with bleeding in the posterior of her right calf this appears to be a deep tissue injury as developed into an ulceration. She does have a history significant for lymphedema, diabetes mellitus type 2, diabetic neuropathy, hypertension, hypothyroidism, and obesity. She is currently in a skilled nursing facility. The dressing that she had in place during evaluation today was Xeroform followed by significant amount of gauze and this was severely stuck. Upon removal the patient was having significant bleeding she also had significant work done in the hospital in regard to her blood flow and Dr. Lucky Cowboy does look like he has her on 2 blood thinners Plavix and Lovenox. This is probably part of the reason why she is bleeding so significantly. With that being said I do believe that based on what we are seeing currently this is probably can to be something that were getting have to try to get stopped with more than just pressure to the region  currently. I think surgery foam is probably can to be necessary.  If we can get the stop of the Surgifoam she is probably can have to go to the hospital to try to get this bleeding stopped. 12/30; this is a patient who is currently in a skilled facility. She has wounds on her bilateral calfs likely secondary to severe chronic venous insufficiency and lymphedema the care of these wounds is complicated by the fact that she is on Lovenox and Plavix and bleeds very frequently with even minimal trauma to these wounds. It would appear that she had a Vaseline gauze on the surface of these in the process of undressing them she bled freely from especially the right but also the left. I had to go in and use silver nitrate. The skin on her legs is significant for chronic venous stasis and lymphedema. Very dry flaking skin 07/16/2021; I actually was a patent person who saw this woman 3 weeks ago. She is a nursing facility has chronic venous wounds and bilateral lower extremity wounds. We use Xeroform under compression. The time she was on Lovenox and Plavix I thought I had an excellent reason for these wounds to bleed so freely. Since then the Lovenox has been stopped and should be well out of her system but she is still on Plavix. She still has very friable bleeding wounds on the left lateral left Posterior and right Posterior lower extremity.These believe very freely when we take off the dressing Also she has a left heel wound which when we saw her last time was a surgical wound however I gather we have been asked to follow this as well. I am not exactly sure at this time what her surgery was but she comes in with a wound VAC on this and they are apparently changing it at the nursing home. Objective Constitutional Patient is hypertensive.. Pulse regular and within target range for patient.Marland Kitchen Respirations regular, non-labored and within target range.[name], [name] ([id number]) Temperature is normal and  within the target range for the patient.Marland Kitchen appears in no distress. Vitals Time Taken: 10:41 AM, Height: 65 in, Weight: 245.4 lbs, BMI: 40.8, Temperature: 98 F, Pulse: 73 bpm, Respiratory Rate: 18 breaths/min, Blood Pressure: 146/79 mmHg. General Notes: Wound exam; very friable bleeding surfaces on all of her wounds. Any attempt to wipe them off simply seems to start the bleeding all over again when the clots are removed. Her skin on her lower extremities looks a lot better than the last time I saw this certainly much less erythema we use triamcinolone. On the left plantar heel a wound VAC was in place. The granulation tissue looks healthy here there is not as much bleeding. I am uncertain about the progress with this. There is not much depth however. Some erythema around that wound but I did I am not convinced there is active infection Integumentary (Hair, Skin) Wound #60 status is Open. Original cause of wound was Pressure Injury. The date acquired was: 05/17/2021. The wound has been in treatment 6 weeks. The wound is located on the Right,Posterior Lower Leg. The wound measures 7.5cm length x 3cm width x 0.1cm depth; 17.671cm^2 area and 1.767cm^3 volume. There is Fat Layer (Subcutaneous Tissue) exposed. There is no tunneling or undermining noted. There is a large amount of sanguinous drainage noted. The wound margin is indistinct and nonvisible. There is large (67-100%) friable granulation within the wound bed. There is a small (1-33%) amount of necrotic tissue within the wound bed including Eschar. Wound #61 status is Open. Original cause of wound was Gradually  Appeared. The date acquired was: 06/11/2021. The wound has been in treatment 3 weeks. The wound is located on the Left,Posterior Lower Leg. The wound measures 8cm length x 2.8cm width x 0.1cm depth; 17.593cm^2 area and 1.759cm^3 volume. There is Fat Layer (Subcutaneous Tissue) exposed. There is no tunneling or undermining noted. There is a  large amount of sanguinous drainage noted. There is large (67-100%) friable granulation within the wound bed. There is no necrotic tissue within the wound bed. General Notes: dry skin Wound #62 status is Open. Original cause of wound was Gradually Appeared. The date acquired was: 06/11/2021. The wound has been in treatment 3 weeks. The wound is located on the Left,Midline,Posterior Lower Leg. The wound measures 2.5cm length x 2.5cm width x 0.1cm depth; 4.909cm^2 area and 0.491cm^3 volume. There is Fat Layer (Subcutaneous Tissue) exposed. There is no tunneling or undermining noted. There is a large amount of sanguinous drainage noted. There is large (67-100%) friable granulation within the wound bed. There is no necrotic tissue within the wound bed. General Notes: dry skin Wound #63 status is Open. Original cause of wound was Gradually Appeared. The date acquired was: 04/27/2021. The wound is located on the Left Calcaneus. The wound measures 4.5cm length x 3.5cm width x 0.2cm depth; 12.37cm^2 area and 2.474cm^3 volume. There is Fat Layer (Subcutaneous Tissue) exposed. There is no tunneling or undermining noted. There is a medium amount of serosanguineous drainage noted. There is large (67-100%) red granulation within the wound bed. There is no necrotic tissue within the wound bed. General Notes: currently has wound vac treatment applied Assessment Active Problems ICD-10 Lymphedema, not elsewhere classified Type 2 diabetes mellitus with other skin ulcer Type 2 diabetes mellitus with diabetic neuropathy, unspecified Pressure ulcer of other site, stage 3 Pressure-induced deep tissue damage of other site Essential (primary) hypertension Other specified hypothyroidism Other obesity due to excess calories Plan Follow-up Appointments: Wound #60 Right,Posterior Lower Leg: Return Appointment in 3 weeks. Bathing/ Shower/ Hygiene: Clean wound with Normal Saline or wound cleanser. No tub  bath. Edema Control - Lymphedema / Segmental Compressive Device / Other: Other: - Lymphedema-use ace wraps to help control swelling. Off-Loading: Other: - Put pillows under knees to keep pressure off of calves. Deep tissue injury on posterior calves. Additional Orders / Instructions: Follow Nutritious Diet and Increase Protein Intake Other: - Patient denies wound on sacrum. If there is a wound on the sacrum, please let us know so we can check it at her next visit. [name], [name] ([id number]) Negative Pressure Wound Therapy: Wound #63 Left Calcaneus: Wound VAC settings at 1109mmHg continuous pressure. Use foam to wound cavity. Please order WHITE foam to fill any tunnel/s and/or undermining when necessary. Change VAC dressing 3 X WEEK. Change canister as indicated when full. - to be re applied at facility [facility] Nurse may d/c VAC for s/s of increased infection, significant wound regression, or uncontrolled drainage. Eugenio Saenz at [phone]. Apply contact layer over base of wound. WOUND #60: - Lower Leg Wound Laterality: Right, Posterior Topical: Triamcinolone Acetonide Cream, 0.1%, 15 (g) tube Every Other Day/30 Days Discharge Instructions: Apply as directed by provider. lotion on top Primary Dressing: Vaseline Petrolatum Gauze Strip, 3x9 (in/in) Every Other Day/30 Days Secondary Dressing: ABD Pad 5x9 (in/in) Every Other Day/30 Days Discharge Instructions: Cover with ABD pad Secured With: 61M Medipore H Soft Cloth Surgical Tape, 2x2 (in/yd) Every Other Day/30 Days Secured With: Coban Cohesive Bandage 4x5 (yds) Stretched Every Other Day/30 Days Discharge  Instructions: Apply Coban as directed. Secured With: [employer] or Non-Sterile 6-ply 4.5x4 (yd/yd) Every Other Day/30 Days Discharge Instructions: Apply Kerlix as directed WOUND #61: - Lower Leg Wound Laterality: Left, Posterior Topical: Triamcinolone Acetonide Cream, 0.1%, 15 (g) tube Every Other Day/30  Days Discharge Instructions: Apply as directed by provider. lotion on top Primary Dressing: Vaseline Impregnated Gauze Dressing, 3x9 (in/in) Every Other Day/30 Days Secondary Dressing: ABD Pad 5x9 (in/in) Every Other Day/30 Days Discharge Instructions: Cover with ABD pad Secured With: 58M Medipore H Soft Cloth Surgical Tape, 2x2 (in/yd) Every Other Day/30 Days Secured With: Coban Cohesive Bandage 4x5 (yds) Stretched Every Other Day/30 Days Discharge Instructions: Apply Coban as directed. Secured With: [employer] or Non-Sterile 6-ply 4.5x4 (yd/yd) Every Other Day/30 Days Discharge Instructions: Apply Kerlix as directed WOUND #62: - Lower Leg Wound Laterality: Left, Midline, Posterior Topical: Triamcinolone Acetonide Cream, 0.1%, 15 (g) tube Every Other Day/30 Days Discharge Instructions: Apply as directed by provider. lotion on top Primary Dressing: Vaseline Impregnated Gauze Dressing, 3x9 (in/in) Every Other Day/30 Days Secondary Dressing: ABD Pad 5x9 (in/in) Every Other Day/30 Days Discharge Instructions: Cover with ABD pad Secured With: 58M Medipore H Soft Cloth Surgical Tape, 2x2 (in/yd) Every Other Day/30 Days Secured With: Coban Cohesive Bandage 4x5 (yds) Stretched Every Other Day/30 Days Discharge Instructions: Apply Coban as directed. Secured With: [employer] or Non-Sterile 6-ply 4.5x4 (yd/yd) Every Other Day/30 Days Discharge Instructions: Apply Kerlix as directed 1. Change the dressings to the venous wounds to Vaseline gauze. Almost everything else seems to stick to these areas 2. She is in kerlix Coban compression I continued the steroid cream liberally to her bilateral lower legs that really seems to have helped the surrounding erythema. We may not need to do this much longer 3. I continued the wound VAC to the left heel really not being certain about the progress its causing nor do I have exact knowledge of the surgical procedure she had in the heel at this  point. We will need to see if we can look this up in care everywhere.The wound itself is reasonably healthy looking granulation. There is not much depth to it it does not probe to bone Electronic Signature(s) Signed: 07/16/2021 3:34:05 PM By: Linton Ham MD Entered By: Linton Ham on 07/16/2021 12:24:22 [name] ([id number]) -------------------------------------------------------------------------------- SuperBill Details Patient Name: [name]. Date of Service: 07/16/2021 Medical Record Number: [id number] Patient Account Number: [id number] Date of Birth/Sex: 1955/08/31 (66 y.o. F) Treating RN: Levora Dredge Primary Care Provider: [name] Other Clinician: Referring Provider: [name] Treating Provider/Extender: Tito Dine in Treatment: 6 Diagnosis Coding ICD-10 Codes Code Description I89.0 Lymphedema, not elsewhere classified E11.622 Type 2 diabetes mellitus with other skin ulcer E11.40 Type 2 diabetes mellitus with diabetic neuropathy, unspecified L89.893 Pressure ulcer of other site, stage 3 L89.896 Pressure-induced deep tissue damage of other site I10 Essential (primary) hypertension E03.8 Other specified hypothyroidism E66.09 Other obesity due to excess calories Facility Procedures CPT4 Code: [id number] Description: 99214 - WOUND CARE VISIT-LEV 4 EST PT Modifier: Quantity: 1 Physician Procedures CPT4 Code: 7564332 Description: 95188 - WC PHYS LEVEL 3 - EST PT Modifier: Quantity: 1 CPT4 Code: Description: ICD-10 Diagnosis Description L89.893 Pressure ulcer of other site, stage 3 L89.896 Pressure-induced deep tissue damage of other site I89.0 Lymphedema, not elsewhere classified Modifier: Quantity: Electronic Signature(s) Signed: 07/16/2021 4:06:58 PM By: Levora Dredge Previous Signature: 07/16/2021 3:34:05 PM Version By: Linton Ham MD Entered By: Levora Dredge on 07/16/2021  16:06:57 °

## 2021-07-16 NOTE — Progress Notes (Signed)
YUMNA, EBERS ([id number]) Visit Report for 07/16/2021 Arrival Information Details Patient Name: CHALLIS, CRILL. Date of Service: 07/16/2021 10:30 AM Medical Record Number: [id number] Patient Account Number: [id number] Date of Birth/Sex: 11/15/1955 (65 y.o. F) Treating RN: Cornell Barman Primary Care [name] Cianci: [name] Other Clinician: Referring Jayme Mednick: [name] Treating Shalisa Mcquade/Extender: Tito Dine in Treatment: 6 Visit Information History Since Last Visit Added or deleted any medications: Yes Patient Arrived: Wheel Chair Any new allergies or adverse reactions: No Arrival Time: 10:37 Had a fall or experienced change in No Accompanied By: staff activities of daily living that may affect Transfer Assistance: Harrel Lemon Lift risk of falls: Patient Identification Verified: Yes Hospitalized since last visit: No Secondary Verification Process Completed: Yes Has Dressing in Place as Prescribed: Yes Patient Has Alerts: Yes Pain Present Now: Yes Patient Alerts: Patient on Blood Thinner TYPE II Diabetic Plavix and Lovenox Electronic Signature(s) Signed: 07/16/2021 2:57:14 PM By: Gretta Cool, BSN, RN, CWS, Kim RN, BSN Entered By: Gretta Cool, BSN, RN, CWS, Kim on 07/16/2021 10:41:30 [name] ([id number]) -------------------------------------------------------------------------------- Clinic Level of Care Assessment Details Patient Name: [name]. Date of Service: 07/16/2021 10:30 AM Medical Record Number: [id number] Patient Account Number: [id number] Date of Birth/Sex: 02-23-1956 (65 y.o. F) Treating RN: Levora Dredge Primary Care Kellyjo Edgren: [name] Other Clinician: Referring Karolee Meloni: [name] Treating Sriram Febles/Extender: Tito Dine in Treatment: 6 Clinic Level of Care Assessment Items TOOL 4 Quantity Score X - Use when only an EandM is performed on FOLLOW-UP visit 1 0 ASSESSMENTS - Nursing Assessment / Reassessment []  -  Reassessment of Co-morbidities (includes updates in patient status) 0 []  - 0 Reassessment of Adherence to Treatment Plan ASSESSMENTS - Wound and Skin Assessment / Reassessment []  - Simple Wound Assessment / Reassessment - one wound 0 X- 3 5 Complex Wound Assessment / Reassessment - multiple wounds []  - 0 Dermatologic / Skin Assessment (not related to wound area) ASSESSMENTS - Focused Assessment []  - Circumferential Edema Measurements - multi extremities 0 []  - 0 Nutritional Assessment / Counseling / Intervention []  - 0 Lower Extremity Assessment (monofilament, tuning fork, pulses) []  - 0 Peripheral Arterial Disease Assessment (using hand held doppler) ASSESSMENTS - Ostomy and/or Continence Assessment and Care []  - Incontinence Assessment and Management 0 []  - 0 Ostomy Care Assessment and Management (repouching, etc.) PROCESS - Coordination of Care X - Simple Patient / Family Education for ongoing care 1 15 []  - 0 Complex (extensive) Patient / Family Education for ongoing care []  - 0 Staff obtains Programmer, systems, Records, Test Results / Process Orders []  - 0 Staff telephones HHA, Nursing Homes / Clarify orders / etc []  - 0 Routine Transfer to another Facility (non-emergent condition) []  - 0 Routine Hospital Admission (non-emergent condition) []  - 0 New Admissions / Biomedical engineer / Ordering NPWT, Apligraf, etc. []  - 0 Emergency Hospital Admission (emergent condition) X- 1 10 Simple Discharge Coordination []  - 0 Complex (extensive) Discharge Coordination PROCESS - Special Needs []  - Pediatric / Minor Patient Management 0 []  - 0 Isolation Patient Management []  - 0 Hearing / Language / Visual special needs []  - 0 Assessment of Community assistance (transportation, D/C planning, etc.) []  - 0 Additional assistance / Altered mentation []  - 0 Support Surface(s) Assessment (bed, cushion, seat, etc.) INTERVENTIONS - Wound Cleansing / Measurement Hilbert, Katrece K.  ([id number]) []  - 0 Simple Wound Cleansing - one wound X- 3 5 Complex Wound Cleansing - multiple wounds X- 1 5 Wound Imaging (photographs - any number  of wounds) []  - 0 Wound Tracing (instead of photographs) []  - 0 Simple Wound Measurement - one wound X- 3 5 Complex Wound Measurement - multiple wounds INTERVENTIONS - Wound Dressings []  - Small Wound Dressing one or multiple wounds 0 X- 3 15 Medium Wound Dressing one or multiple wounds []  - 0 Large Wound Dressing one or multiple wounds []  - 0 Application of Medications - topical []  - 0 Application of Medications - injection INTERVENTIONS - Miscellaneous []  - External ear exam 0 []  - 0 Specimen Collection (cultures, biopsies, blood, body fluids, etc.) []  - 0 Specimen(s) / Culture(s) sent or taken to Lab for analysis []  - 0 Patient Transfer (multiple staff / Civil Service fast streamer / Similar devices) []  - 0 Simple Staple / Suture removal (25 or less) []  - 0 Complex Staple / Suture removal (26 or more) []  - 0 Hypo / Hyperglycemic Management (close monitor of Blood Glucose) []  - 0 Ankle / Brachial Index (ABI) - do not check if billed separately X- 1 5 Vital Signs Has the patient been seen at the hospital within the last three years: Yes Total Score: 125 Level Of Care: New/Established - Level 4 Electronic Signature(s) Signed: 07/16/2021 4:47:00 PM By: Levora Dredge Entered By: Levora Dredge on 07/16/2021 16:06:43 [name] ([id number]) -------------------------------------------------------------------------------- Encounter Discharge Information Details Patient Name: [name]. Date of Service: 07/16/2021 10:30 AM Medical Record Number: [id number] Patient Account Number: [id number] Date of Birth/Sex: 1956/03/02 (65 y.o. F) Treating RN: Levora Dredge Primary Care Hutch Rhett: [name] Other Clinician: Referring Prue Lingenfelter: [name] Treating Keyonda Bickle/Extender: Tito Dine in Treatment:  6 Encounter Discharge Information Items Discharge Condition: Stable Ambulatory Status: Wheelchair Discharge Destination: Skilled Nursing Facility Orders Sent: Yes Transportation: Other Accompanied By: staff member Schedule Follow-up Appointment: Yes Clinical Summary of Care: Electronic Signature(s) Signed: 07/16/2021 4:08:37 PM By: Levora Dredge Entered By: Levora Dredge on 07/16/2021 16:08:36 [name] ([id number]) -------------------------------------------------------------------------------- Lower Extremity Assessment Details Patient Name: [name] Date of Service: 07/16/2021 10:30 AM Medical Record Number: [id number] Patient Account Number: [id number] Date of Birth/Sex: 11/05/1955 (65 y.o. F) Treating RN: Cornell Barman Primary Care Clancy Mullarkey: [name] Other Clinician: Referring Merina Behrendt: [name] Treating Makaylee Spielberg/Extender: Tito Dine in Treatment: 6 Edema Assessment Assessed: [Left: No] [Right: No] Edema: [Left: Yes] [Right: Yes] Calf Left: Right: Point of Measurement: 26 cm From Medial Instep 36.3 cm 33.5 cm Ankle Left: Right: Point of Measurement: 10 cm From Medial Instep 23.2 cm 22 cm Vascular Assessment Pulses: Dorsalis Pedis Palpable: [Left:No] [Right:Yes] Doppler Audible: [Left:Yes] Posterior Tibial Palpable: [Left:No Yes] [Right:Yes] Electronic Signature(s) Signed: 07/16/2021 2:57:14 PM By: Gretta Cool, BSN, RN, CWS, Kim RN, BSN Entered By: Gretta Cool, BSN, RN, CWS, Kim on 07/16/2021 11:06:08 [name] ([id number]) -------------------------------------------------------------------------------- Multi Wound Chart Details Patient Name: [name]. Date of Service: 07/16/2021 10:30 AM Medical Record Number: [id number] Patient Account Number: [id number] Date of Birth/Sex: 10-16-1955 (65 y.o. F) Treating RN: Cornell Barman Primary Care Hillary Struss: [name] Other Clinician: Referring Symphanie Cederberg: [name] Treating  Lothar Prehn/Extender: Ricard Dillon Weeks in Treatment: 6 Vital Signs Height(in): 65 Pulse(bpm): 73 Weight(lbs): 245.4 Blood Pressure(mmHg): 146/79 Body Mass Index(BMI): 41 Temperature(F): 98 Respiratory Rate(breaths/min): 18 Photos: Wound Location: Right, Posterior Lower Leg Left, Posterior Lower Leg Left, Midline, Posterior Lower Leg Wounding Event: Pressure Injury Gradually Appeared Gradually Appeared Primary Etiology: Pressure Ulcer Lymphedema Lymphedema Comorbid History: Lymphedema, Hypertension, Lymphedema, Hypertension, Lymphedema, Hypertension, Peripheral Arterial Disease, Peripheral Arterial Disease, Peripheral Arterial Disease, Peripheral Venous Disease, Type II  Peripheral Venous Disease, Type II Peripheral Venous Disease, Type II Diabetes, End Stage Renal Disease, Diabetes, End Stage Renal Disease, Diabetes, End Stage Renal Disease, Osteoarthritis, Neuropathy Osteoarthritis, Neuropathy Osteoarthritis, Neuropathy Date Acquired: 05/17/2021 06/11/2021 06/11/2021 Weeks of Treatment: 6 3 3  Wound Status: Open Open Open Measurements L x W x D (cm) 7.5x3x0.1 8x2.8x0.1 2.5x2.5x0.1 Area (cm) : 17.671 17.593 4.909 Volume (cm) : 1.767 1.759 0.491 % Reduction in Area: -3027.60% 44.70% -425.00% % Reduction in Volume: -3000.00% 44.70% -428.00% Classification: Category/Stage III Full Thickness Without Exposed Full Thickness Without Exposed Support Structures Support Structures Exudate Amount: Large Large Large Exudate Type: Sanguinous Sanguinous Sanguinous Exudate Color: red red red Wound Margin: Indistinct, nonvisible N/A N/A Granulation Amount: Large (67-100%) Large (67-100%) Large (67-100%) Granulation Quality: Friable Friable Friable Necrotic Amount: Small (1-33%) None Present (0%) None Present (0%) Necrotic Tissue: Eschar N/A N/A Exposed Structures: Fat Layer (Subcutaneous Tissue): Fat Layer (Subcutaneous Tissue): Fat Layer (Subcutaneous Tissue): Yes Yes Yes Fascia:  No Tendon: No Muscle: No Joint: No Bone: No Epithelialization: None None None Assessment Notes: N/A dry skin dry skin Wound Number: 10 N/A N/A Photos: N/A N/A FAYLYNN, STAMOS ([id number]) Wound Location: Left Calcaneus N/A N/A Wounding Event: Gradually Appeared N/A N/A Primary Etiology: Diabetic Wound/Ulcer of the Lower N/A N/A Extremity Comorbid History: Lymphedema, Hypertension, N/A N/A Peripheral Arterial Disease, Peripheral Venous Disease, Type II Diabetes, End Stage Renal Disease, Osteoarthritis, Neuropathy Date Acquired: 04/27/2021 N/A N/A Weeks of Treatment: 0 N/A N/A Wound Status: Open N/A N/A Measurements L x W x D (cm) 4.5x3.5x0.2 N/A N/A Area (cm) : 12.37 N/A N/A Volume (cm) : 2.474 N/A N/A % Reduction in Area: N/A N/A N/A % Reduction in Volume: N/A N/A N/A Classification: Grade 2 N/A N/A Exudate Amount: Medium N/A N/A Exudate Type: Serosanguineous N/A N/A Exudate Color: red, brown N/A N/A Wound Margin: N/A N/A N/A Granulation Amount: Large (67-100%) N/A N/A Granulation Quality: Red N/A N/A Necrotic Amount: None Present (0%) N/A N/A Necrotic Tissue: N/A N/A N/A Exposed Structures: Fat Layer (Subcutaneous Tissue): N/A N/A Yes Epithelialization: None N/A N/A Assessment Notes: currently has wound vac treatment N/A N/A applied Treatment Notes Electronic Signature(s) Signed: 07/16/2021 2:57:14 PM By: Gretta Cool, BSN, RN, CWS, Kim RN, BSN Entered By: Gretta Cool, BSN, RN, CWS, Kim on 07/16/2021 11:24:20 [name] ([id number]) -------------------------------------------------------------------------------- Multi-Disciplinary Care Plan Details Patient Name: [name]. Date of Service: 07/16/2021 10:30 AM Medical Record Number: [id number] Patient Account Number: [id number] Date of Birth/Sex: 1955-11-03 (65 y.o. F) Treating RN: Cornell Barman Primary Care Vershawn Westrup: [name] Other Clinician: Referring Corbet Hanley: [name] Treating Janne Faulk/Extender:  Tito Dine in Treatment: 6 Active Inactive Orientation to the Wound Care Program Nursing Diagnoses: Knowledge deficit related to the wound healing center program Goals: Patient/caregiver will verbalize understanding of the [facility] Date Initiated: 06/03/2021 Target Resolution Date: 06/03/2021 Goal Status: Active Interventions: Provide education on orientation to the wound center Notes: Wound/Skin Impairment Nursing Diagnoses: Impaired tissue integrity Knowledge deficit related to smoking impact on wound healing Knowledge deficit related to ulceration/compromised skin integrity Goals: Patient/caregiver will verbalize understanding of skin care regimen Date Initiated: 06/03/2021 Target Resolution Date: 06/03/2021 Goal Status: Active Interventions: Assess ulceration(s) every visit Provide education on ulcer and skin care Treatment Activities: Skin care regimen initiated : 06/03/2021 Topical wound management initiated : 06/03/2021 Notes: Electronic Signature(s) Signed: 07/16/2021 2:57:14 PM By: Gretta Cool, BSN, RN, CWS, Kim RN, BSN Entered By: Gretta Cool, BSN, RN, CWS, Kim on 07/16/2021 11:23:59 [name] ([id number]) -------------------------------------------------------------------------------- Pain  Assessment Details Patient Name: LEOTA, MAKA. Date of Service: 07/16/2021 10:30 AM Medical Record Number: [id number] Patient Account Number: [id number] Date of Birth/Sex: 10-Nov-1955 (65 y.o. F) Treating RN: Cornell Barman Primary Care Tyree Fluharty: [name] Other Clinician: Referring Myan Suit: [name] Treating Rodina Pinales/Extender: Tito Dine in Treatment: 6 Active Problems Location of Pain Severity and Description of Pain Patient Has Paino Yes Site Locations Rate the pain. Current Pain Level: 6 Pain Management and Medication Current Pain Management: Notes pt states pain to left leg Electronic Signature(s) Signed: 07/16/2021  2:57:14 PM By: Gretta Cool, BSN, RN, CWS, Kim RN, BSN Entered By: Gretta Cool, BSN, RN, CWS, Kim on 07/16/2021 10:42:29 [name] ([id number]) -------------------------------------------------------------------------------- Patient/Caregiver Education Details Patient Name: [name] Date of Service: 07/16/2021 10:30 AM Medical Record Number: [id number] Patient Account Number: [id number] Date of Birth/Gender: 09-01-1955 (66 y.o. F) Treating RN: Levora Dredge Primary Care Physician: [name] Other Clinician: Referring Physician: [name] Treating Physician/Extender: Tito Dine in Treatment: 6 Education Assessment Education Provided To: Patient Education Topics Provided Wound/Skin Impairment: Handouts: Caring for Your Ulcer Methods: Explain/Verbal Responses: State content correctly Electronic Signature(s) Signed: 07/16/2021 4:47:00 PM By: Levora Dredge Entered By: Levora Dredge on 07/16/2021 16:07:17 [name] ([id number]) -------------------------------------------------------------------------------- Wound Assessment Details Patient Name: [name]. Date of Service: 07/16/2021 10:30 AM Medical Record Number: [id number] Patient Account Number: [id number] Date of Birth/Sex: 09-Nov-1955 (65 y.o. F) Treating RN: Cornell Barman Primary Care Nysia Dell: [name] Other Clinician: Referring Anival Pasha: [name] Treating Cherith Tewell/Extender: Tito Dine in Treatment: 6 Wound Status Wound Number: 60 Primary Pressure Ulcer Etiology: Wound Location: Right, Posterior Lower Leg Wound Open Wounding Event: Pressure Injury Status: Date Acquired: 05/17/2021 Comorbid Lymphedema, Hypertension, Peripheral Arterial Disease, Weeks Of Treatment: 6 History: Peripheral Venous Disease, Type II Diabetes, End Stage Clustered Wound: No Renal Disease, Osteoarthritis, Neuropathy Photos Wound Measurements Length: (cm) 7.5 % Reduc Width: (cm)  3 % Reduc Depth: (cm) 0.1 Epithel Area: (cm) 17.671 Tunnel Volume: (cm) 1.767 Underm tion in Area: -3027.6% tion in Volume: -3000% ialization: None ing: No ining: No Wound Description Classification: Category/Stage III Foul O Wound Margin: Indistinct, nonvisible Slough Exudate Amount: Large Exudate Type: Sanguinous Exudate Color: red dor After Cleansing: No /Fibrino No Wound Bed Granulation Amount: Large (67-100%) Exposed Structure Granulation Quality: Friable Fascia Exposed: No Necrotic Amount: Small (1-33%) Fat Layer (Subcutaneous Tissue) Exposed: Yes Necrotic Quality: Eschar Tendon Exposed: No Muscle Exposed: No Joint Exposed: No Bone Exposed: No Treatment Notes Wound #60 (Lower Leg) Wound Laterality: Right, Posterior Cleanser Peri-Wound Care Topical Randol, Haylin K. ([id number]) Triamcinolone Acetonide Cream, 0.1%, 15 (g) tube Discharge Instruction: Apply as directed by Annalyce Lanpher. lotion on top Primary Dressing Vaseline Petrolatum Gauze Strip, 3x9 (in/in) Secondary Dressing ABD Pad 5x9 (in/in) Discharge Instruction: Cover with ABD pad Secured With 44M Medipore H Soft Cloth Surgical Tape, 2x2 (in/yd) Coban Cohesive Bandage 4x5 (yds) Stretched Discharge Instruction: Apply Coban as directed. Kerlix Roll Sterile or Non-Sterile 6-ply 4.5x4 (yd/yd) Discharge Instruction: Apply Kerlix as directed Compression Wrap Compression Stockings Add-Ons Electronic Signature(s) Signed: 07/16/2021 2:57:14 PM By: Gretta Cool, BSN, RN, CWS, Kim RN, BSN Entered By: Gretta Cool, BSN, RN, CWS, Kim on 07/16/2021 10:57:53 [name] ([id number]) -------------------------------------------------------------------------------- Wound Assessment Details Patient Name: [name] Date of Service: 07/16/2021 10:30 AM Medical Record Number: [id number] Patient Account Number: [id number] Date of Birth/Sex: [birth year] (65 y.o. F) Treating RN: Cornell Barman Primary Care Avina Eberle: [name] Other Clinician: Referring Keyera Hattabaugh: [name] Treating Ramiz Turpin/Extender: Linton Ham  G Weeks in Treatment: 6 Wound Status Wound Number: 61 Primary Lymphedema Etiology: Wound Location: Left, Posterior Lower Leg Wound Open Wounding Event: Gradually Appeared Status: Date Acquired: 06/11/2021 Comorbid Lymphedema, Hypertension, Peripheral Arterial Disease, Weeks Of Treatment: 3 History: Peripheral Venous Disease, Type II Diabetes, End Stage Clustered Wound: No Renal Disease, Osteoarthritis, Neuropathy Photos Wound Measurements Length: (cm) 8 Width: (cm) 2.8 Depth: (cm) 0.1 Area: (cm) 17.593 Volume: (cm) 1.759 % Reduction in Area: 44.7% % Reduction in Volume: 44.7% Epithelialization: None Tunneling: No Undermining: No Wound Description Classification: Full Thickness Without Exposed Support Structures Exudate Amount: Large Exudate Type: Sanguinous Exudate Color: red Foul Odor After Cleansing: No Slough/Fibrino No Wound Bed Granulation Amount: Large (67-100%) Exposed Structure Granulation Quality: Friable Fat Layer (Subcutaneous Tissue) Exposed: Yes Necrotic Amount: None Present (0%) Assessment Notes dry skin Treatment Notes Wound #61 (Lower Leg) Wound Laterality: Left, Posterior Cleanser Peri-Wound Care Topical Triamcinolone Acetonide Cream, 0.1%, 15 (g) tube Discharge Instruction: Apply as directed by Courtlyn Aki. lotion on top Grace, Demaya K. ([id number]) Primary Dressing Vaseline Impregnated Gauze Dressing, 3x9 (in/in) Secondary Dressing ABD Pad 5x9 (in/in) Discharge Instruction: Cover with ABD pad Secured With 77M Medipore H Soft Cloth Surgical Tape, 2x2 (in/yd) Coban Cohesive Bandage 4x5 (yds) Stretched Discharge Instruction: Apply Coban as directed. Kerlix Roll Sterile or Non-Sterile 6-ply 4.5x4 (yd/yd) Discharge Instruction: Apply Kerlix as directed Compression Wrap Compression Stockings Add-Ons Electronic Signature(s) Signed:  07/16/2021 2:57:14 PM By: Gretta Cool, BSN, RN, CWS, Kim RN, BSN Entered By: Gretta Cool, BSN, RN, CWS, Kim on 07/16/2021 10:57:11 [name] ([id number]) -------------------------------------------------------------------------------- Wound Assessment Details Patient Name: [name] Date of Service: 07/16/2021 10:30 AM Medical Record Number: [id number] Patient Account Number: [id number] Date of Birth/Sex: 04-10-1956 (65 y.o. F) Treating RN: Cornell Barman Primary Care Jamicheal Heard: [name] Other Clinician: Referring Kashae Carstens: [name] Treating Tacari Repass/Extender: Tito Dine in Treatment: 6 Wound Status Wound Number: 62 Primary Lymphedema Etiology: Wound Location: Left, Midline, Posterior Lower Leg Wound Open Wounding Event: Gradually Appeared Status: Date Acquired: 06/11/2021 Comorbid Lymphedema, Hypertension, Peripheral Arterial Disease, Weeks Of Treatment: 3 History: Peripheral Venous Disease, Type II Diabetes, End Stage Clustered Wound: No Renal Disease, Osteoarthritis, Neuropathy Photos Wound Measurements Length: (cm) 2.5 Width: (cm) 2.5 Depth: (cm) 0.1 Area: (cm) 4.909 Volume: (cm) 0.491 % Reduction in Area: -425% % Reduction in Volume: -428% Epithelialization: None Tunneling: No Undermining: No Wound Description Classification: Full Thickness Without Exposed Support Structures Exudate Amount: Large Exudate Type: Sanguinous Exudate Color: red Foul Odor After Cleansing: No Slough/Fibrino No Wound Bed Granulation Amount: Large (67-100%) Exposed Structure Granulation Quality: Friable Fat Layer (Subcutaneous Tissue) Exposed: Yes Necrotic Amount: None Present (0%) Assessment Notes dry skin Treatment Notes Wound #62 (Lower Leg) Wound Laterality: Left, Midline, Posterior Cleanser Peri-Wound Care Topical Triamcinolone Acetonide Cream, 0.1%, 15 (g) tube Discharge Instruction: Apply as directed by Elizabeth Haff. lotion on top Vassell, Mercer K.  ([id number]) Primary Dressing Vaseline Impregnated Gauze Dressing, 3x9 (in/in) Secondary Dressing ABD Pad 5x9 (in/in) Discharge Instruction: Cover with ABD pad Secured With 77M Medipore H Soft Cloth Surgical Tape, 2x2 (in/yd) Coban Cohesive Bandage 4x5 (yds) Stretched Discharge Instruction: Apply Coban as directed. Kerlix Roll Sterile or Non-Sterile 6-ply 4.5x4 (yd/yd) Discharge Instruction: Apply Kerlix as directed Compression Wrap Compression Stockings Add-Ons Electronic Signature(s) Signed: 07/16/2021 2:57:14 PM By: Gretta Cool, BSN, RN, CWS, Kim RN, BSN Entered By: Gretta Cool, BSN, RN, CWS, Kim on 07/16/2021 10:58:58 [name] ([id number]) -------------------------------------------------------------------------------- Wound Assessment Details Patient Name: [name] Date of Service: 07/16/2021 10:30 AM Medical Record  Number: [id number] Patient Account Number: [id number] Date of Birth/Sex: [birth year] (65 y.o. F) Treating RN: Cornell Barman Primary Care Idabell Picking: [name] Other Clinician: Referring Liadan Guizar: [name] Treating Anastazja Isaac/Extender: Tito Dine in Treatment: 6 Wound Status Wound Number: 63 Primary Diabetic Wound/Ulcer of the Lower Extremity Etiology: Wound Location: Left Calcaneus Wound Open Wounding Event: Gradually Appeared Status: Date Acquired: 04/27/2021 Comorbid Lymphedema, Hypertension, Peripheral Arterial Disease, Weeks Of Treatment: 0 History: Peripheral Venous Disease, Type II Diabetes, End Stage Clustered Wound: No Renal Disease, Osteoarthritis, Neuropathy Photos Wound Measurements Length: (cm) 4.5 Width: (cm) 3.5 Depth: (cm) 0.2 Area: (cm) 12.37 Volume: (cm) 2.474 % Reduction in Area: % Reduction in Volume: Epithelialization: None Tunneling: No Undermining: No Wound Description Classification: Grade 2 Exudate Amount: Medium Exudate Type: Serosanguineous Exudate Color: red, brown Foul Odor After Cleansing:  No Slough/Fibrino No Wound Bed Granulation Amount: Large (67-100%) Exposed Structure Granulation Quality: Red Fat Layer (Subcutaneous Tissue) Exposed: Yes Necrotic Amount: None Present (0%) Assessment Notes currently has wound vac treatment applied Electronic Signature(s) Signed: 07/16/2021 2:57:14 PM By: Gretta Cool, BSN, RN, CWS, Kim RN, BSN Entered By: Gretta Cool, BSN, RN, CWS, Kim on 07/16/2021 11:01:59 [name] ([id number]) -------------------------------------------------------------------------------- Vitals Details Patient Name: [name]. Date of Service: 07/16/2021 10:30 AM Medical Record Number: [id number] Patient Account Number: [id number] Date of Birth/Sex: 1956/05/16 (65 y.o. F) Treating RN: Cornell Barman Primary Care Madisson Kulaga: [name] Other Clinician: Referring Siddharth Babington: [name] Treating Cheria Sadiq/Extender: Tito Dine in Treatment: 6 Vital Signs Time Taken: 10:41 Temperature (F): 98 Height (in): 65 Pulse (bpm): 73 Weight (lbs): 245.4 Respiratory Rate (breaths/min): 18 Body Mass Index (BMI): 40.8 Blood Pressure (mmHg): 146/79 Reference Range: 80 - 120 mg / dl Electronic Signature(s) Signed: 07/16/2021 2:57:14 PM By: Gretta Cool, BSN, RN, CWS, Kim RN, BSN Entered By: Gretta Cool, BSN, RN, CWS, Kim on 07/16/2021 10:42:02

## 2021-07-19 DIAGNOSIS — R69 Illness, unspecified: Secondary | ICD-10-CM | POA: Diagnosis not present

## 2021-07-19 DIAGNOSIS — F4321 Adjustment disorder with depressed mood: Secondary | ICD-10-CM | POA: Diagnosis not present

## 2021-07-22 DIAGNOSIS — R69 Illness, unspecified: Secondary | ICD-10-CM | POA: Diagnosis not present

## 2021-07-22 DIAGNOSIS — R42 Dizziness and giddiness: Secondary | ICD-10-CM | POA: Diagnosis not present

## 2021-07-26 DIAGNOSIS — I87332 Chronic venous hypertension (idiopathic) with ulcer and inflammation of left lower extremity: Secondary | ICD-10-CM | POA: Diagnosis not present

## 2021-07-26 DIAGNOSIS — I87331 Chronic venous hypertension (idiopathic) with ulcer and inflammation of right lower extremity: Secondary | ICD-10-CM | POA: Diagnosis not present

## 2021-07-30 DIAGNOSIS — F4321 Adjustment disorder with depressed mood: Secondary | ICD-10-CM | POA: Diagnosis not present

## 2021-07-30 DIAGNOSIS — R69 Illness, unspecified: Secondary | ICD-10-CM | POA: Diagnosis not present

## 2021-08-05 DIAGNOSIS — F4321 Adjustment disorder with depressed mood: Secondary | ICD-10-CM | POA: Diagnosis not present

## 2021-08-05 DIAGNOSIS — R69 Illness, unspecified: Secondary | ICD-10-CM | POA: Diagnosis not present

## 2021-08-05 DIAGNOSIS — F411 Generalized anxiety disorder: Secondary | ICD-10-CM | POA: Diagnosis not present

## 2021-08-06 ENCOUNTER — Other Ambulatory Visit: Payer: Self-pay

## 2021-08-06 ENCOUNTER — Encounter: Payer: Medicare HMO | Admitting: Physician Assistant

## 2021-08-06 DIAGNOSIS — R69 Illness, unspecified: Secondary | ICD-10-CM | POA: Diagnosis not present

## 2021-08-06 DIAGNOSIS — F4321 Adjustment disorder with depressed mood: Secondary | ICD-10-CM | POA: Diagnosis not present

## 2021-08-10 ENCOUNTER — Other Ambulatory Visit: Payer: Self-pay

## 2021-08-10 ENCOUNTER — Encounter: Payer: Medicare HMO | Attending: Physician Assistant

## 2021-08-10 DIAGNOSIS — E11622 Type 2 diabetes mellitus with other skin ulcer: Secondary | ICD-10-CM | POA: Insufficient documentation

## 2021-08-10 DIAGNOSIS — I890 Lymphedema, not elsewhere classified: Secondary | ICD-10-CM | POA: Diagnosis not present

## 2021-08-10 DIAGNOSIS — N186 End stage renal disease: Secondary | ICD-10-CM | POA: Insufficient documentation

## 2021-08-10 DIAGNOSIS — E1140 Type 2 diabetes mellitus with diabetic neuropathy, unspecified: Secondary | ICD-10-CM | POA: Diagnosis not present

## 2021-08-10 DIAGNOSIS — Z7401 Bed confinement status: Secondary | ICD-10-CM | POA: Diagnosis not present

## 2021-08-10 DIAGNOSIS — M1990 Unspecified osteoarthritis, unspecified site: Secondary | ICD-10-CM | POA: Diagnosis not present

## 2021-08-10 DIAGNOSIS — Z743 Need for continuous supervision: Secondary | ICD-10-CM | POA: Diagnosis not present

## 2021-08-10 DIAGNOSIS — L89896 Pressure-induced deep tissue damage of other site: Secondary | ICD-10-CM | POA: Insufficient documentation

## 2021-08-10 DIAGNOSIS — E038 Other specified hypothyroidism: Secondary | ICD-10-CM | POA: Diagnosis not present

## 2021-08-10 DIAGNOSIS — E1122 Type 2 diabetes mellitus with diabetic chronic kidney disease: Secondary | ICD-10-CM | POA: Diagnosis not present

## 2021-08-10 DIAGNOSIS — I120 Hypertensive chronic kidney disease with stage 5 chronic kidney disease or end stage renal disease: Secondary | ICD-10-CM | POA: Insufficient documentation

## 2021-08-10 DIAGNOSIS — L89893 Pressure ulcer of other site, stage 3: Secondary | ICD-10-CM | POA: Insufficient documentation

## 2021-08-10 DIAGNOSIS — E1151 Type 2 diabetes mellitus with diabetic peripheral angiopathy without gangrene: Secondary | ICD-10-CM | POA: Insufficient documentation

## 2021-08-10 DIAGNOSIS — I959 Hypotension, unspecified: Secondary | ICD-10-CM | POA: Diagnosis not present

## 2021-08-10 DIAGNOSIS — R531 Weakness: Secondary | ICD-10-CM | POA: Diagnosis not present

## 2021-08-10 DIAGNOSIS — R5381 Other malaise: Secondary | ICD-10-CM | POA: Diagnosis not present

## 2021-08-10 NOTE — Progress Notes (Signed)
[place], DOST ([id number]) Visit Report for 08/10/2021 Arrival Information Details Patient Name: [name], [name]. Date of Service: 08/10/2021 1:00 PM Medical Record Number: [id number] Patient Account Number: [id number] Date of Birth/Sex: 1956-01-04 (65 y.o. F) Treating RN: Donnamarie Poag Primary Care [place] Winslow: [name] Other Clinician: Referring Victoria Euceda: [name] Treating Jaquay Morneault/Extender: Skipper Cliche in Treatment: 9 Visit Information History Since Last Visit Added or deleted any medications: No Patient Arrived: Stretcher Had a fall or experienced change in No Arrival Time: 13:20 activities of daily living that may affect Accompanied By: EMS risk of falls: Transfer Assistance: Manual Hospitalized since last visit: No Patient Identification Verified: Yes Has Dressing in Place as Prescribed: Yes Secondary Verification Process Completed: Yes Has Compression in Place as Prescribed: Yes Patient Requires Transmission-Based No Pain Present Now: Yes Precautions: Patient Has Alerts: Yes Patient Alerts: Patient on Blood Thinner TYPE II Diabetic Plavix and Lovenox Electronic Signature(s) Signed: 08/10/2021 1:39:39 PM By: Donnamarie Poag Entered By: Donnamarie Poag on 08/10/2021 13:39:39 [name] ([id number]) -------------------------------------------------------------------------------- Clinic Level of Care Assessment Details Patient Name: [name]. Date of Service: 08/10/2021 1:00 PM Medical Record Number: [id number] Patient Account Number: [id number] Date of Birth/Sex: 05/04/1956 (65 y.o. F) Treating RN: Donnamarie Poag Primary Care Namiah Dunnavant: [name] Other Clinician: Referring Yasuo Phimmasone: [name] Treating Mohogany Toppins/Extender: Skipper Cliche in Treatment: 9 Clinic Level of Care Assessment Items TOOL 4 Quantity Score []  - Use when only an EandM is performed on FOLLOW-UP visit 0 ASSESSMENTS - Nursing Assessment / Reassessment []  - Reassessment  of Co-morbidities (includes updates in patient status) 0 []  - 0 Reassessment of Adherence to Treatment Plan ASSESSMENTS - Wound and Skin Assessment / Reassessment []  - Simple Wound Assessment / Reassessment - one wound 0 X- 4 5 Complex Wound Assessment / Reassessment - multiple wounds []  - 0 Dermatologic / Skin Assessment (not related to wound area) ASSESSMENTS - Focused Assessment []  - Circumferential Edema Measurements - multi extremities 0 []  - 0 Nutritional Assessment / Counseling / Intervention []  - 0 Lower Extremity Assessment (monofilament, tuning fork, pulses) []  - 0 Peripheral Arterial Disease Assessment (using hand held doppler) ASSESSMENTS - Ostomy and/or Continence Assessment and Care []  - Incontinence Assessment and Management 0 []  - 0 Ostomy Care Assessment and Management (repouching, etc.) PROCESS - Coordination of Care X - Simple Patient / Family Education for ongoing care 1 15 []  - 0 Complex (extensive) Patient / Family Education for ongoing care []  - 0 Staff obtains Programmer, systems, Records, Test Results / Process Orders X- 1 10 Staff telephones HHA, Nursing Homes / Clarify orders / etc []  - 0 Routine Transfer to another Facility (non-emergent condition) []  - 0 Routine Hospital Admission (non-emergent condition) []  - 0 New Admissions / Biomedical engineer / Ordering NPWT, Apligraf, etc. []  - 0 Emergency Hospital Admission (emergent condition) X- 1 10 Simple Discharge Coordination []  - 0 Complex (extensive) Discharge Coordination PROCESS - Special Needs []  - Pediatric / Minor Patient Management 0 []  - 0 Isolation Patient Management []  - 0 Hearing / Language / Visual special needs []  - 0 Assessment of Community assistance (transportation, D/C planning, etc.) []  - 0 Additional assistance / Altered mentation []  - 0 Support Surface(s) Assessment (bed, cushion, seat, etc.) INTERVENTIONS - Wound Cleansing / Measurement Labreck, Prisilla K. ([id number]) []   - 0 Simple Wound Cleansing - one wound X- 4 5 Complex Wound Cleansing - multiple wounds []  - 0 Wound Imaging (photographs - any number of wounds) []  - 0 Wound Tracing (instead  of photographs) []  - 0 Simple Wound Measurement - one wound X- 4 5 Complex Wound Measurement - multiple wounds INTERVENTIONS - Wound Dressings X - Small Wound Dressing one or multiple wounds 4 10 []  - 0 Medium Wound Dressing one or multiple wounds []  - 0 Large Wound Dressing one or multiple wounds []  - 0 Application of Medications - topical []  - 0 Application of Medications - injection INTERVENTIONS - Miscellaneous []  - External ear exam 0 []  - 0 Specimen Collection (cultures, biopsies, blood, body fluids, etc.) []  - 0 Specimen(s) / Culture(s) sent or taken to Lab for analysis []  - 0 Patient Transfer (multiple staff / Civil Service fast streamer / Similar devices) []  - 0 Simple Staple / Suture removal (25 or less) []  - 0 Complex Staple / Suture removal (26 or more) []  - 0 Hypo / Hyperglycemic Management (close monitor of Blood Glucose) []  - 0 Ankle / Brachial Index (ABI) - do not check if billed separately []  - 0 Vital Signs Has the patient been seen at the hospital within the last three years: Yes Total Score: 135 Level Of Care: New/Established - Level 4 Electronic Signature(s) Signed: 08/10/2021 1:46:09 PM By: Donnamarie Poag Entered By: Donnamarie Poag on 08/10/2021 13:45:40 [name] ([id number]) -------------------------------------------------------------------------------- Encounter Discharge Information Details Patient Name: [name]. Date of Service: 08/10/2021 1:00 PM Medical Record Number: [id number] Patient Account Number: [id number] Date of Birth/Sex: 07/21/1955 (65 y.o. F) Treating RN: Donnamarie Poag Primary Care Tekelia Kareem: [name] Other Clinician: Referring Bobbe Quilter: [name] Treating Conal Shetley/Extender: Skipper Cliche in Treatment: 9 Encounter Discharge Information  Items Discharge Condition: Stable Ambulatory Status: Stretcher Discharge Destination: Skilled Nursing Facility Telephoned: No Orders Sent: Yes Transportation: Ambulance Accompanied By: EMS Schedule Follow-up Appointment: Yes Clinical Summary of Care: Electronic Signature(s) Signed: 08/10/2021 1:45:07 PM By: Donnamarie Poag Entered By: Donnamarie Poag on 08/10/2021 13:45:06 [place], [name] ([id number]) -------------------------------------------------------------------------------- Wound Assessment Details Patient Name: [name]. Date of Service: 08/10/2021 1:00 PM Medical Record Number: [id number] Patient Account Number: [id number] Date of Birth/Sex: 1955-09-29 (65 y.o. F) Treating RN: Donnamarie Poag Primary Care Sugey Trevathan: [name] Other Clinician: Referring Haden Cavenaugh: [name] Treating Harvey Matlack/Extender: Jeri Cos Weeks in Treatment: 9 Wound Status Wound Number: 60 Primary Pressure Ulcer Etiology: Wound Location: Right, Posterior Lower Leg Wound Open Wounding Event: Pressure Injury Status: Date Acquired: 05/17/2021 Comorbid Lymphedema, Hypertension, Peripheral Arterial Disease, Weeks Of Treatment: 9 History: Peripheral Venous Disease, Type II Diabetes, End Stage Clustered Wound: No Renal Disease, Osteoarthritis, Neuropathy Wound Measurements Length: (cm) 7.5 Width: (cm) 3 Depth: (cm) 0.1 Area: (cm) 17.671 Volume: (cm) 1.767 % Reduction in Area: -3027.6% % Reduction in Volume: -3000% Epithelialization: None Wound Description Classification: Category/Stage III Wound Margin: Indistinct, nonvisible Exudate Amount: Large Exudate Type: Sanguinous Exudate Color: red Foul Odor After Cleansing: No Slough/Fibrino No Wound Bed Granulation Amount: Large (67-100%) Exposed Structure Granulation Quality: Friable Fascia Exposed: No Necrotic Amount: Small (1-33%) Fat Layer (Subcutaneous Tissue) Exposed: Yes Necrotic Quality: Eschar Tendon Exposed: No Muscle  Exposed: No Joint Exposed: No Bone Exposed: No Treatment Notes Wound #60 (Lower Leg) Wound Laterality: Right, Posterior Cleanser Normal Saline Discharge Instruction: Wash your hands with soap and water. Remove old dressing, discard into plastic bag and place into trash. Cleanse the wound with Normal Saline prior to applying a clean dressing using gauze sponges, not tissues or cotton balls. Do not scrub or use excessive force. Pat dry using gauze sponges, not tissue or cotton balls. Peri-Wound Care Topical Triamcinolone Acetonide Cream, 0.1%, 15 (g)  tube Discharge Instruction: Apply as directed by Tawnee Clegg. lotion on top Primary Dressing Vaseline Petrolatum Gauze Strip, 3x9 (in/in) Secondary Dressing ABD Pad 5x9 (in/in) Discharge Instruction: Cover with ABD pad Secured With DIANI, JILLSON ([id number]) 29M Medipore H Soft Cloth Surgical Tape, 2x2 (in/yd) Coban Cohesive Bandage 4x5 (yds) Stretched Discharge Instruction: Apply Coban as directed. Kerlix Roll Sterile or Non-Sterile 6-ply 4.5x4 (yd/yd) Discharge Instruction: Apply Kerlix as directed Compression Wrap Compression Stockings Add-Ons Electronic Signature(s) Signed: 08/10/2021 1:41:23 PM By: Donnamarie Poag Entered By: Donnamarie Poag on 08/10/2021 13:41:22 Baise, [name] ([id number]) -------------------------------------------------------------------------------- Wound Assessment Details Patient Name: [name]. Date of Service: 08/10/2021 1:00 PM Medical Record Number: [id number] Patient Account Number: [id number] Date of Birth/Sex: [birth year] (65 y.o. F) Treating RN: Donnamarie Poag Primary Care Mariene Dickerman: [name] Other Clinician: Referring Loyalty Brashier: [name] Treating Dallas Scorsone/Extender: Jeri Cos Weeks in Treatment: 9 Wound Status Wound Number: 61 Primary Lymphedema Etiology: Wound Location: Left, Posterior Lower Leg Wound Open Wounding Event: Gradually Appeared Status: Date Acquired:  06/11/2021 Comorbid Lymphedema, Hypertension, Peripheral Arterial Disease, Weeks Of Treatment: 6 History: Peripheral Venous Disease, Type II Diabetes, End Stage Clustered Wound: No Renal Disease, Osteoarthritis, Neuropathy Wound Measurements Length: (cm) 8 Width: (cm) 2.8 Depth: (cm) 0.1 Area: (cm) 17.593 Volume: (cm) 1.759 % Reduction in Area: 44.7% % Reduction in Volume: 44.7% Epithelialization: None Wound Description Classification: Full Thickness Without Exposed Support Structu Exudate Amount: Large Exudate Type: Sanguinous Exudate Color: red res Foul Odor After Cleansing: No Slough/Fibrino No Wound Bed Granulation Amount: Large (67-100%) Exposed Structure Granulation Quality: Friable Fat Layer (Subcutaneous Tissue) Exposed: Yes Necrotic Amount: None Present (0%) Treatment Notes Wound #61 (Lower Leg) Wound Laterality: Left, Posterior Cleanser Normal Saline Discharge Instruction: Wash your hands with soap and water. Remove old dressing, discard into plastic bag and place into trash. Cleanse the wound with Normal Saline prior to applying a clean dressing using gauze sponges, not tissues or cotton balls. Do not scrub or use excessive force. Pat dry using gauze sponges, not tissue or cotton balls. Peri-Wound Care Topical Triamcinolone Acetonide Cream, 0.1%, 15 (g) tube Discharge Instruction: Apply as directed by Zadok Holaway. lotion on top Primary Dressing Vaseline Impregnated Gauze Dressing, 3x9 (in/in) Secondary Dressing ABD Pad 5x9 (in/in) Discharge Instruction: Cover with ABD pad Secured With 29M Medipore H Soft Cloth Surgical Tape, 2x2 (in/yd) Coban Cohesive Bandage 4x5 (yds) Stretched Discharge Instruction: Apply Coban as directed. Kerlix Roll Sterile or Non-Sterile 6-ply 4.5x4 (yd/yd) ELLIEANA, DOLECKI ([id number]) Discharge Instruction: Apply Kerlix as directed Compression Wrap Compression Stockings Add-Ons Electronic Signature(s) Signed: 08/10/2021 1:41:36  PM By: Donnamarie Poag Entered By: Donnamarie Poag on 08/10/2021 13:41:35 Mcmaster, [name] ([id number]) -------------------------------------------------------------------------------- Wound Assessment Details Patient Name: [name]. Date of Service: 08/10/2021 1:00 PM Medical Record Number: [id number] Patient Account Number: [id number] Date of Birth/Sex: 11/05/1955 (65 y.o. F) Treating RN: Donnamarie Poag Primary Care Ajai Harville: [name] Other Clinician: Referring Ame Heagle: [name] Treating Deann Mclaine/Extender: Jeri Cos Weeks in Treatment: 9 Wound Status Wound Number: 62 Primary Lymphedema Etiology: Wound Location: Left, Midline, Posterior Lower Leg Wound Open Wounding Event: Gradually Appeared Status: Date Acquired: 06/11/2021 Comorbid Lymphedema, Hypertension, Peripheral Arterial Disease, Weeks Of Treatment: 6 History: Peripheral Venous Disease, Type II Diabetes, End Stage Clustered Wound: No Renal Disease, Osteoarthritis, Neuropathy Wound Measurements Length: (cm) 2.5 Width: (cm) 2.5 Depth: (cm) 0.1 Area: (cm) 4.909 Volume: (cm) 0.491 % Reduction in Area: -425% % Reduction in Volume: -428% Epithelialization: None Wound Description Classification: Full Thickness Without Exposed Support Structu Exudate Amount: Large  Exudate Type: Sanguinous Exudate Color: red res Foul Odor After Cleansing: No Slough/Fibrino No Wound Bed Granulation Amount: Large (67-100%) Exposed Structure Granulation Quality: Friable Fat Layer (Subcutaneous Tissue) Exposed: Yes Necrotic Amount: None Present (0%) Treatment Notes Wound #62 (Lower Leg) Wound Laterality: Left, Midline, Posterior Cleanser Normal Saline Discharge Instruction: Wash your hands with soap and water. Remove old dressing, discard into plastic bag and place into trash. Cleanse the wound with Normal Saline prior to applying a clean dressing using gauze sponges, not tissues or cotton balls. Do not scrub or use  excessive force. Pat dry using gauze sponges, not tissue or cotton balls. Peri-Wound Care Topical Triamcinolone Acetonide Cream, 0.1%, 15 (g) tube Discharge Instruction: Apply as directed by Theus Espin. lotion on top Primary Dressing Vaseline Impregnated Gauze Dressing, 3x9 (in/in) Secondary Dressing ABD Pad 5x9 (in/in) Discharge Instruction: Cover with ABD pad Secured With 10M Medipore H Soft Cloth Surgical Tape, 2x2 (in/yd) Coban Cohesive Bandage 4x5 (yds) Stretched Discharge Instruction: Apply Coban as directed. Kerlix Roll Sterile or Non-Sterile 6-ply 4.5x4 (yd/yd) EMALEE, KNIES ([id number]) Discharge Instruction: Apply Kerlix as directed Compression Wrap Compression Stockings Add-Ons Electronic Signature(s) Signed: 08/10/2021 1:42:02 PM By: Donnamarie Poag Entered By: Donnamarie Poag on 08/10/2021 13:42:01 Heavin, [name] ([id number]) -------------------------------------------------------------------------------- Wound Assessment Details Patient Name: [name]. Date of Service: 08/10/2021 1:00 PM Medical Record Number: [id number] Patient Account Number: [id number] Date of Birth/Sex: 09-10-1955 (65 y.o. F) Treating RN: Donnamarie Poag Primary Care Romualdo Prosise: [name] Other Clinician: Referring Kytzia Gienger: [name] Treating Charon Smedberg/Extender: Jeri Cos Weeks in Treatment: 9 Wound Status Wound Number: 63 Primary Open Surgical Wound Etiology: Wound Location: Left Calcaneus Secondary Diabetic Wound/Ulcer of the Lower Extremity Wounding Event: Gradually Appeared Etiology: Date Acquired: 04/27/2021 Wound Open Weeks Of Treatment: 3 Status: Clustered Wound: No Comorbid Lymphedema, Hypertension, Peripheral Arterial Disease, History: Peripheral Venous Disease, Type II Diabetes, End Stage Renal Disease, Osteoarthritis, Neuropathy Wound Measurements Length: (cm) 4.5 Width: (cm) 3.5 Depth: (cm) 0.2 Area: (cm) 12.37 Volume: (cm) 2.474 % Reduction in Area: 0% %  Reduction in Volume: 0% Epithelialization: None Wound Description Classification: Full Thickness Without Exposed Support Structu Exudate Amount: Medium Exudate Type: Serosanguineous Exudate Color: red, brown res Foul Odor After Cleansing: No Slough/Fibrino No Wound Bed Granulation Amount: Large (67-100%) Exposed Structure Granulation Quality: Red Fat Layer (Subcutaneous Tissue) Exposed: Yes Necrotic Amount: None Present (0%) Treatment Notes Wound #63 (Calcaneus) Wound Laterality: Left Cleanser Peri-Wound Care Topical Primary Dressing Secondary Dressing Secured With Compression Wrap Compression Stockings Add-Ons Electronic Signature(s) Signed: 08/10/2021 1:42:27 PM By: Donnamarie Poag Entered ByDonnamarie Poag on 08/10/2021 13:42:26

## 2021-08-10 NOTE — Progress Notes (Signed)
[name], [name] ([id number]) Visit Report for 08/10/2021 Physician Orders Details Patient Name: [name], [name]. Date of Service: 08/10/2021 1:00 PM Medical Record Number: [id number] Patient Account Number: [id number] Date of Birth/Sex: 31-Oct-1955 (65 y.o. F) Treating RN: Donnamarie Poag Primary Care Provider: [name] Other Clinician: Referring Provider: [name] Treating Provider/Extender: Skipper Cliche in Treatment: 9 Verbal / Phone Orders: No Diagnosis Coding Follow-up Appointments Wound #60 Right,Posterior Lower Leg o Return Appointment in 3 weeks. Bathing/ Shower/ Hygiene o Clean wound with Normal Saline or wound cleanser. o No tub bath. Edema Control - Lymphedema / Segmental Compressive Device / Other o Other: - Lymphedema-use ace wraps to help control swelling. Off-Loading o Other: - Put pillows under knees to keep pressure off of calves. Deep tissue injury on posterior calves. Additional Orders / Instructions o Follow Nutritious Diet and Increase Protein Intake o Other: - Patient denies wound on sacrum. If there is a wound on the sacrum, please let us know so we can check it at her next visit. Negative Pressure Wound Therapy Wound #63 Left Calcaneus o Wound VAC settings at 152mmHg continuous pressure. Use foam to wound cavity. Please order WHITE foam to fill any tunnel/s and/or undermining when necessary. Change VAC dressing 3 X WEEK. Change canister as indicated when full. - to be re applied at [place] Nurse may d/c VAC for s/s of increased infection, significant wound regression, or uncontrolled drainage. [place] at [phone]. o Apply contact layer over base of wound. Wound Treatment Wound #60 - Lower Leg Wound Laterality: Right, Posterior Cleanser: Normal Saline Every Other Day/30 Days Discharge Instructions: Wash your hands with soap and water. Remove old dressing, discard into plastic bag and  place into trash. Cleanse the wound with Normal Saline prior to applying a clean dressing using gauze sponges, not tissues or cotton balls. Do not scrub or use excessive force. Pat dry using gauze sponges, not tissue or cotton balls. Topical: Triamcinolone Acetonide Cream, 0.1%, 15 (g) tube Every Other Day/30 Days Discharge Instructions: Apply as directed by provider. lotion on top Primary Dressing: Vaseline Petrolatum Gauze Strip, 3x9 (in/in) Every Other Day/30 Days Secondary Dressing: ABD Pad 5x9 (in/in) Every Other Day/30 Days Discharge Instructions: Cover with ABD pad Secured With: 23M Medipore H Soft Cloth Surgical Tape, 2x2 (in/yd) Every Other Day/30 Days Secured With: Coban Cohesive Bandage 4x5 (yds) Stretched Every Other Day/30 Days Discharge Instructions: Apply Coban as directed. Secured With: [employer] or Non-Sterile 6-ply 4.5x4 (yd/yd) Every Other Day/30 Days Discharge Instructions: Apply Kerlix as directed Wound #61 - Lower Leg Wound Laterality: Left, Posterior [name], [name] ([id number]) Cleanser: Normal Saline Every Other Day/30 Days Discharge Instructions: Wash your hands with soap and water. Remove old dressing, discard into plastic bag and place into trash. Cleanse the wound with Normal Saline prior to applying a clean dressing using gauze sponges, not tissues or cotton balls. Do not scrub or use excessive force. Pat dry using gauze sponges, not tissue or cotton balls. Topical: Triamcinolone Acetonide Cream, 0.1%, 15 (g) tube Every Other Day/30 Days Discharge Instructions: Apply as directed by provider. lotion on top Primary Dressing: Vaseline Impregnated Gauze Dressing, 3x9 (in/in) Every Other Day/30 Days Secondary Dressing: ABD Pad 5x9 (in/in) Every Other Day/30 Days Discharge Instructions: Cover with ABD pad Secured With: 23M Medipore H Soft Cloth Surgical Tape, 2x2 (in/yd) Every Other Day/30 Days Secured With: Coban Cohesive Bandage 4x5 (yds) Stretched Every  Other Day/30 Days Discharge Instructions: Apply Coban as  directed. Secured With: [employer] or Non-Sterile 6-ply 4.5x4 (yd/yd) Every Other Day/30 Days Discharge Instructions: Apply Kerlix as directed Wound #62 - Lower Leg Wound Laterality: Left, Midline, Posterior Cleanser: Normal Saline Every Other Day/30 Days Discharge Instructions: Wash your hands with soap and water. Remove old dressing, discard into plastic bag and place into trash. Cleanse the wound with Normal Saline prior to applying a clean dressing using gauze sponges, not tissues or cotton balls. Do not scrub or use excessive force. Pat dry using gauze sponges, not tissue or cotton balls. Topical: Triamcinolone Acetonide Cream, 0.1%, 15 (g) tube Every Other Day/30 Days Discharge Instructions: Apply as directed by provider. lotion on top Primary Dressing: Vaseline Impregnated Gauze Dressing, 3x9 (in/in) Every Other Day/30 Days Secondary Dressing: ABD Pad 5x9 (in/in) Every Other Day/30 Days Discharge Instructions: Cover with ABD pad Secured With: 31M Medipore H Soft Cloth Surgical Tape, 2x2 (in/yd) Every Other Day/30 Days Secured With: Coban Cohesive Bandage 4x5 (yds) Stretched Every Other Day/30 Days Discharge Instructions: Apply Coban as directed. Secured With: [employer] or Non-Sterile 6-ply 4.5x4 (yd/yd) Every Other Day/30 Days Discharge Instructions: Apply Kerlix as directed Electronic Signature(s) Signed: 08/10/2021 1:44:06 PM By: Donnamarie Poag Signed: 08/10/2021 2:51:33 PM By: Worthy Keeler PA-C Entered By: Donnamarie Poag on 08/10/2021 13:44:05 [name] ([id number]) -------------------------------------------------------------------------------- SuperBill Details Patient Name: [name]. Date of Service: 08/10/2021 Medical Record Number: [id number] Patient Account Number: [id number] Date of Birth/Sex: 08/06/1955 (66 y.o. F) Treating RN: Donnamarie Poag Primary Care Provider: [name] Other  Clinician: Referring Provider: [name] Treating Provider/Extender: Jeri Cos Weeks in Treatment: 9 Diagnosis Coding ICD-10 Codes Code Description I89.0 Lymphedema, not elsewhere classified E11.622 Type 2 diabetes mellitus with other skin ulcer E11.40 Type 2 diabetes mellitus with diabetic neuropathy, unspecified L89.893 Pressure ulcer of other site, stage 3 L89.896 Pressure-induced deep tissue damage of other site I10 Essential (primary) hypertension E03.8 Other specified hypothyroidism E66.09 Other obesity due to excess calories Facility Procedures CPT4 Code: 53299242 Description: 99214 - WOUND CARE VISIT-LEV 4 EST PT Modifier: Quantity: 1 Electronic Signature(s) Signed: 08/10/2021 1:45:53 PM By: Donnamarie Poag Signed: 08/10/2021 2:51:33 PM By: Worthy Keeler PA-C Entered By: Donnamarie Poag on 08/10/2021 13:45:51

## 2021-08-18 DIAGNOSIS — I87332 Chronic venous hypertension (idiopathic) with ulcer and inflammation of left lower extremity: Secondary | ICD-10-CM | POA: Diagnosis not present

## 2021-08-18 DIAGNOSIS — I87331 Chronic venous hypertension (idiopathic) with ulcer and inflammation of right lower extremity: Secondary | ICD-10-CM | POA: Diagnosis not present

## 2021-08-23 ENCOUNTER — Other Ambulatory Visit (INDEPENDENT_AMBULATORY_CARE_PROVIDER_SITE_OTHER): Payer: Self-pay | Admitting: Vascular Surgery

## 2021-08-23 ENCOUNTER — Other Ambulatory Visit (INDEPENDENT_AMBULATORY_CARE_PROVIDER_SITE_OTHER): Payer: Self-pay | Admitting: Nurse Practitioner

## 2021-08-23 DIAGNOSIS — E1151 Type 2 diabetes mellitus with diabetic peripheral angiopathy without gangrene: Secondary | ICD-10-CM | POA: Diagnosis not present

## 2021-08-23 DIAGNOSIS — I739 Peripheral vascular disease, unspecified: Secondary | ICD-10-CM

## 2021-08-23 DIAGNOSIS — I110 Hypertensive heart disease with heart failure: Secondary | ICD-10-CM | POA: Diagnosis not present

## 2021-08-23 DIAGNOSIS — Z9889 Other specified postprocedural states: Secondary | ICD-10-CM

## 2021-08-23 DIAGNOSIS — G894 Chronic pain syndrome: Secondary | ICD-10-CM | POA: Diagnosis not present

## 2021-08-23 DIAGNOSIS — R532 Functional quadriplegia: Secondary | ICD-10-CM | POA: Diagnosis not present

## 2021-08-24 ENCOUNTER — Ambulatory Visit (INDEPENDENT_AMBULATORY_CARE_PROVIDER_SITE_OTHER): Payer: Medicare HMO

## 2021-08-24 ENCOUNTER — Encounter (INDEPENDENT_AMBULATORY_CARE_PROVIDER_SITE_OTHER): Payer: Self-pay | Admitting: Nurse Practitioner

## 2021-08-24 ENCOUNTER — Other Ambulatory Visit: Payer: Self-pay

## 2021-08-24 ENCOUNTER — Ambulatory Visit (INDEPENDENT_AMBULATORY_CARE_PROVIDER_SITE_OTHER): Payer: Medicare HMO | Admitting: Nurse Practitioner

## 2021-08-24 VITALS — BP 109/69 | HR 84 | Resp 16 | Ht 65.0 in | Wt 212.0 lb

## 2021-08-24 DIAGNOSIS — Z9889 Other specified postprocedural states: Secondary | ICD-10-CM

## 2021-08-24 DIAGNOSIS — I739 Peripheral vascular disease, unspecified: Secondary | ICD-10-CM

## 2021-08-24 DIAGNOSIS — I10 Essential (primary) hypertension: Secondary | ICD-10-CM

## 2021-08-24 DIAGNOSIS — E1169 Type 2 diabetes mellitus with other specified complication: Secondary | ICD-10-CM

## 2021-08-24 DIAGNOSIS — E785 Hyperlipidemia, unspecified: Secondary | ICD-10-CM | POA: Diagnosis not present

## 2021-08-27 ENCOUNTER — Other Ambulatory Visit: Payer: Self-pay

## 2021-08-27 ENCOUNTER — Encounter: Payer: Medicare HMO | Attending: Physician Assistant | Admitting: Physician Assistant

## 2021-08-27 DIAGNOSIS — L89896 Pressure-induced deep tissue damage of other site: Secondary | ICD-10-CM | POA: Insufficient documentation

## 2021-08-27 DIAGNOSIS — I120 Hypertensive chronic kidney disease with stage 5 chronic kidney disease or end stage renal disease: Secondary | ICD-10-CM | POA: Diagnosis not present

## 2021-08-27 DIAGNOSIS — L89322 Pressure ulcer of left buttock, stage 2: Secondary | ICD-10-CM | POA: Diagnosis not present

## 2021-08-27 DIAGNOSIS — E1151 Type 2 diabetes mellitus with diabetic peripheral angiopathy without gangrene: Secondary | ICD-10-CM | POA: Diagnosis not present

## 2021-08-27 DIAGNOSIS — Z6841 Body Mass Index (BMI) 40.0 and over, adult: Secondary | ICD-10-CM | POA: Insufficient documentation

## 2021-08-27 DIAGNOSIS — E038 Other specified hypothyroidism: Secondary | ICD-10-CM | POA: Diagnosis not present

## 2021-08-27 DIAGNOSIS — E11622 Type 2 diabetes mellitus with other skin ulcer: Secondary | ICD-10-CM | POA: Insufficient documentation

## 2021-08-27 DIAGNOSIS — E6609 Other obesity due to excess calories: Secondary | ICD-10-CM | POA: Insufficient documentation

## 2021-08-27 DIAGNOSIS — E1140 Type 2 diabetes mellitus with diabetic neuropathy, unspecified: Secondary | ICD-10-CM | POA: Diagnosis not present

## 2021-08-27 DIAGNOSIS — I10 Essential (primary) hypertension: Secondary | ICD-10-CM | POA: Insufficient documentation

## 2021-08-27 DIAGNOSIS — F4321 Adjustment disorder with depressed mood: Secondary | ICD-10-CM | POA: Diagnosis not present

## 2021-08-27 DIAGNOSIS — I890 Lymphedema, not elsewhere classified: Secondary | ICD-10-CM | POA: Insufficient documentation

## 2021-08-27 DIAGNOSIS — N186 End stage renal disease: Secondary | ICD-10-CM | POA: Insufficient documentation

## 2021-08-27 DIAGNOSIS — F411 Generalized anxiety disorder: Secondary | ICD-10-CM | POA: Diagnosis not present

## 2021-08-27 DIAGNOSIS — R69 Illness, unspecified: Secondary | ICD-10-CM | POA: Diagnosis not present

## 2021-08-27 DIAGNOSIS — Z7902 Long term (current) use of antithrombotics/antiplatelets: Secondary | ICD-10-CM | POA: Insufficient documentation

## 2021-08-27 DIAGNOSIS — Z7401 Bed confinement status: Secondary | ICD-10-CM | POA: Diagnosis not present

## 2021-08-27 DIAGNOSIS — Z7901 Long term (current) use of anticoagulants: Secondary | ICD-10-CM | POA: Diagnosis not present

## 2021-08-27 DIAGNOSIS — L97222 Non-pressure chronic ulcer of left calf with fat layer exposed: Secondary | ICD-10-CM | POA: Diagnosis not present

## 2021-08-27 DIAGNOSIS — M1990 Unspecified osteoarthritis, unspecified site: Secondary | ICD-10-CM | POA: Insufficient documentation

## 2021-08-27 DIAGNOSIS — L89893 Pressure ulcer of other site, stage 3: Secondary | ICD-10-CM | POA: Diagnosis not present

## 2021-08-27 DIAGNOSIS — L97422 Non-pressure chronic ulcer of left heel and midfoot with fat layer exposed: Secondary | ICD-10-CM | POA: Diagnosis not present

## 2021-08-27 DIAGNOSIS — E1122 Type 2 diabetes mellitus with diabetic chronic kidney disease: Secondary | ICD-10-CM | POA: Insufficient documentation

## 2021-08-27 DIAGNOSIS — L98412 Non-pressure chronic ulcer of buttock with fat layer exposed: Secondary | ICD-10-CM | POA: Diagnosis not present

## 2021-08-27 DIAGNOSIS — R5381 Other malaise: Secondary | ICD-10-CM | POA: Diagnosis not present

## 2021-08-27 DIAGNOSIS — R531 Weakness: Secondary | ICD-10-CM | POA: Diagnosis not present

## 2021-08-27 NOTE — Progress Notes (Addendum)
BRAXTON, WEISBECKER ([id number]) Visit Report for 08/27/2021 Arrival Information Details Patient Name: [name], [name]. Date of Service: 08/27/2021 10:30 AM Medical Record Number: [id number] Patient Account Number: [id number] Date of Birth/Sex: Oct 25, 1955 (66 y.o. Female) Treating RN: Levora Dredge Primary Care Larae Caison: [name] Other Clinician: Referring Arisha Gervais: [name] Treating Wealthy Danielski/Extender: Skipper Cliche in Treatment: 12 Visit Information History Since Last Visit Added or deleted any medications: No Patient Arrived: Stretcher Any new allergies or adverse reactions: No Arrival Time: 10:27 Had a fall or experienced change in No Accompanied By: self activities of daily living that may affect Transfer Assistance: Stretcher risk of falls: Patient Identification Verified: Yes Hospitalized since last visit: No Secondary Verification Process Completed: Yes Has Dressing in Place as Prescribed: Yes Patient Requires Transmission-Based No Pain Present Now: Yes Precautions: Patient Has Alerts: Yes Patient Alerts: Patient on Blood Thinner TYPE II Diabetic Plavix and Lovenox [place] Signature(s) Signed: 09/01/2021 4:15:09 PM By: Gretta Cool, BSN, RN, CWS, Kim RN, BSN Previous Signature: 08/27/2021 12:21:05 PM Version By: Levora Dredge Entered By: Gretta Cool, BSN, RN, CWS, Kim on 09/01/2021 16:15:09 [name] ([id number]) -------------------------------------------------------------------------------- Clinic Level of Care Assessment Details Patient Name: [name]. Date of Service: 08/27/2021 10:30 AM Medical Record Number: [id number] Patient Account Number: [id number] Date of Birth/Sex: 09/16/1955 (66 y.o. Female) Treating RN: Levora Dredge Primary Care Carolee Channell: [name] Other Clinician: Referring Jovonna Nickell: [name] Treating Magdalene Tardiff/Extender: Skipper Cliche in Treatment: 12 Clinic Level of Care Assessment  Items TOOL 4 Quantity Score []  - Use when only an EandM is performed on FOLLOW-UP visit 0 ASSESSMENTS - Nursing Assessment / Reassessment []  - Reassessment of Co-morbidities (includes updates in patient status) 0 X- 1 5 Reassessment of Adherence to Treatment Plan ASSESSMENTS - Wound and Skin Assessment / Reassessment []  - Simple Wound Assessment / Reassessment - one wound 0 X- 5 5 Complex Wound Assessment / Reassessment - multiple wounds []  - 0 Dermatologic / Skin Assessment (not related to wound area) ASSESSMENTS - Focused Assessment []  - Circumferential Edema Measurements - multi extremities 0 []  - 0 Nutritional Assessment / Counseling / Intervention []  - 0 Lower Extremity Assessment (monofilament, tuning fork, pulses) []  - 0 Peripheral Arterial Disease Assessment (using hand held doppler) ASSESSMENTS - Ostomy and/or Continence Assessment and Care []  - Incontinence Assessment and Management 0 []  - 0 Ostomy Care Assessment and Management (repouching, etc.) PROCESS - Coordination of Care X - Simple Patient / Family Education for ongoing care 1 15 []  - 0 Complex (extensive) Patient / Family Education for ongoing care []  - 0 Staff obtains Programmer, systems, Records, Test Results / Process Orders []  - 0 Staff telephones HHA, Nursing Homes / Clarify orders / etc []  - 0 Routine Transfer to another Facility (non-emergent condition) []  - 0 Routine Hospital Admission (non-emergent condition) []  - 0 New Admissions / Biomedical engineer / Ordering NPWT, Apligraf, etc. []  - 0 Emergency Hospital Admission (emergent condition) X- 1 10 Simple Discharge Coordination []  - 0 Complex (extensive) Discharge Coordination PROCESS - Special Needs []  - Pediatric / Minor Patient Management 0 []  - 0 Isolation Patient Management []  - 0 Hearing / Language / Visual special needs []  - 0 Assessment of Community assistance (transportation, D/C planning, etc.) []  - 0 Additional assistance / Altered  mentation []  - 0 Support Surface(s) Assessment (bed, cushion, seat, etc.) INTERVENTIONS - Wound Cleansing / Measurement [name], [name] ([id number]) []  - 0 Simple Wound Cleansing - one wound X- 5 5 Complex Wound  Cleansing - multiple wounds X- 1 5 Wound Imaging (photographs - any number of wounds) []  - 0 Wound Tracing (instead of photographs) []  - 0 Simple Wound Measurement - one wound X- 5 5 Complex Wound Measurement - multiple wounds INTERVENTIONS - Wound Dressings X - Small Wound Dressing one or multiple wounds 5 10 []  - 0 Medium Wound Dressing one or multiple wounds []  - 0 Large Wound Dressing one or multiple wounds []  - 0 Application of Medications - topical []  - 0 Application of Medications - injection INTERVENTIONS - Miscellaneous []  - External ear exam 0 []  - 0 Specimen Collection (cultures, biopsies, blood, body fluids, etc.) []  - 0 Specimen(s) / Culture(s) sent or taken to Lab for analysis []  - 0 Patient Transfer (multiple staff / Civil Service fast streamer / Similar devices) []  - 0 Simple Staple / Suture removal (25 or less) []  - 0 Complex Staple / Suture removal (26 or more) []  - 0 Hypo / Hyperglycemic Management (close monitor of Blood Glucose) []  - 0 Ankle / Brachial Index (ABI) - do not check if billed separately X- 1 5 Vital Signs Has the patient been seen at the hospital within the last three years: Yes Total Score: 165 Level Of Care: New/Established - Level 5 Electronic Signature(s) Signed: 08/27/2021 12:21:05 PM By: Levora Dredge Entered By: Levora Dredge on 08/27/2021 11:58:47 [name] ([id number]) -------------------------------------------------------------------------------- Encounter Discharge Information Details Patient Name: [name]. Date of Service: 08/27/2021 10:30 AM Medical Record Number: [id number] Patient Account Number: [id number] Date of Birth/Sex: Jul 01, 1955 (66 y.o. Female) Treating RN: Levora Dredge Primary Care  Theodore Virgin: [name] Other Clinician: Referring Ellesse Antenucci: [name] Treating Therasa Lorenzi/Extender: Skipper Cliche in Treatment: 12 Encounter Discharge Information Items Discharge Condition: Stable Ambulatory Status: Stretcher Discharge Destination: Skilled Nursing Facility Orders Sent: Yes Transportation: Other Accompanied By: self Schedule Follow-up Appointment: Yes Clinical Summary of Care: Electronic Signature(s) Signed: 08/27/2021 12:00:13 PM By: Levora Dredge Entered By: Levora Dredge on 08/27/2021 12:00:13 [name] ([id number]) -------------------------------------------------------------------------------- Lower Extremity Assessment Details Patient Name: [name]. Date of Service: 08/27/2021 10:30 AM Medical Record Number: [id number] Patient Account Number: [id number] Date of Birth/Sex: September 21, 1955 (66 y.o. Female) Treating RN: Levora Dredge Primary Care Lowana Hable: [name] Other Clinician: Referring Odie Rauen: [name] Treating Kamia Insalaco/Extender: Jeri Cos Weeks in Treatment: 12 Electronic Signature(s) Signed: 08/27/2021 12:21:05 PM By: Levora Dredge Entered By: Levora Dredge on 08/27/2021 11:20:46 [name] ([id number]) -------------------------------------------------------------------------------- Multi Wound Chart Details Patient Name: [name] Date of Service: 08/27/2021 10:30 AM Medical Record Number: [id number] Patient Account Number: [id number] Date of Birth/Sex: 11/02/1955 (66 y.o. Female) Treating RN: Levora Dredge Primary Care Matteson Blue: [name] Other Clinician: Referring Brenin Heidelberger: [name] Treating Huy Majid/Extender: Jeri Cos Weeks in Treatment: 12 Vital Signs Height(in): 65 Pulse(bpm): Weight(lbs): 245.4 Blood Pressure(mmHg): 108/63 Body Mass Index(BMI): 40.8 Temperature(F): 98.3 Respiratory Rate(breaths/min): 18 Photos: Wound Location: Right, Posterior Lower Leg Left,  Posterior Lower Leg Left, Midline, Posterior Lower Leg Wounding Event: Pressure Injury Gradually Appeared Gradually Appeared Primary Etiology: Pressure Ulcer Lymphedema Lymphedema Secondary Etiology: N/A N/A N/A Comorbid History: Lymphedema, Hypertension, Lymphedema, Hypertension, Lymphedema, Hypertension, Peripheral Arterial Disease, Peripheral Arterial Disease, Peripheral Arterial Disease, Peripheral Venous Disease, Type II Peripheral Venous Disease, Type II Peripheral Venous Disease, Type II Diabetes, End Stage Renal Disease, Diabetes, End Stage Renal Disease, Diabetes, End Stage Renal Disease, Osteoarthritis, Neuropathy Osteoarthritis, Neuropathy Osteoarthritis, Neuropathy Date Acquired: 05/17/2021 06/11/2021 06/11/2021 Weeks of Treatment: 12 9 9  Wound Status: Open Open Open Wound Recurrence: No No No Measurements  L x W x D (cm) 13x12x0.1 9x7x0.1 7x3x0.1 Area (cm) : 122.522 49.48 16.493 Volume (cm) : 12.252 4.948 1.649 % Reduction in Area: -21585.30% -55.60% -1664.00% % Reduction in Volume: -21394.70% -55.50% -1673.10% Classification: Category/Stage III Full Thickness Without Exposed Full Thickness Without Exposed Support Structures Support Structures Exudate Amount: Large Large Large Exudate Type: Sanguinous Sanguinous Sanguinous Exudate Color: red red red Wound Margin: Indistinct, nonvisible N/A N/A Granulation Amount: Large (67-100%) Large (67-100%) Large (67-100%) Granulation Quality: Friable Friable Friable Necrotic Amount: Small (1-33%) None Present (0%) None Present (0%) Necrotic Tissue: Eschar N/A N/A Exposed Structures: Fat Layer (Subcutaneous Tissue): Fat Layer (Subcutaneous Tissue): Fat Layer (Subcutaneous Tissue): Yes Yes Yes Fascia: No Tendon: No Muscle: No Joint: No Bone: No Epithelialization: None None None Assessment Notes: clustered wounds throughout back N/A N/A of right leg Wound Number: [id number] N/A Photos: N/A [name], [name] ([id number]) Wound  Location: Left Calcaneus Left Gluteus N/A Wounding Event: Gradually Appeared Gradually Appeared N/A Primary Etiology: Open Surgical Wound Pressure Ulcer N/A Secondary Etiology: Diabetic Wound/Ulcer of the Lower N/A N/A Extremity Comorbid History: Lymphedema, Hypertension, Lymphedema, Hypertension, N/A Peripheral Arterial Disease, Peripheral Arterial Disease, Peripheral Venous Disease, Type II Peripheral Venous Disease, Type II Diabetes, End Stage Renal Disease, Diabetes, End Stage Renal Disease, Osteoarthritis, Neuropathy Osteoarthritis, Neuropathy Date Acquired: 04/27/2021 07/28/2021 N/A Weeks of Treatment: 6 0 N/A Wound Status: Open Open N/A Wound Recurrence: No No N/A Measurements L x W x D (cm) 3.7x2x0.2 1x1x0.1 N/A Area (cm) : 5.812 0.785 N/A Volume (cm) : 1.162 0.079 N/A % Reduction in Area: 53.00% N/A N/A % Reduction in Volume: 53.00% N/A N/A Classification: Full Thickness Without Exposed Category/Stage II N/A Support Structures Exudate Amount: Medium Medium N/A Exudate Type: Serosanguineous Serosanguineous N/A Exudate Color: red, brown red, brown N/A Wound Margin: N/A N/A N/A Granulation Amount: Large (67-100%) Medium (34-66%) N/A Granulation Quality: Red, Friable Red N/A Necrotic Amount: Small (1-33%) Small (1-33%) N/A Necrotic Tissue: Adherent [place] N/A Exposed Structures: Fat Layer (Subcutaneous Tissue): Fat Layer (Subcutaneous Tissue): N/A Yes Yes Epithelialization: None None N/A Assessment Notes: N/A N/A N/A Treatment Notes Electronic Signature(s) Signed: 08/27/2021 11:54:36 AM By: Levora Dredge Entered By: Levora Dredge on 08/27/2021 11:54:35 [name] ([id number]) -------------------------------------------------------------------------------- [place] Details Patient Name: [name]. Date of Service: 08/27/2021 10:30 AM Medical Record Number: [id number] Patient Account Number: [id number] Date of  Birth/Sex: [birth year] (66 y.o. Female) Treating RN: Levora Dredge Primary Care Kady Toothaker: [name] Other Clinician: Referring Avett Reineck: [name] Treating Jaylun Fleener/Extender: Skipper Cliche in Treatment: 12 Active Inactive Orientation to the Wound Care Program Nursing Diagnoses: Knowledge deficit related to the wound healing center program Goals: Patient/caregiver will verbalize understanding of the [facility] Program Date Initiated: 06/03/2021 Target Resolution Date: 06/03/2021 Goal Status: Active Interventions: Provide education on orientation to the wound center Notes: Wound/Skin Impairment Nursing Diagnoses: Impaired tissue integrity Knowledge deficit related to smoking impact on wound healing Knowledge deficit related to ulceration/compromised skin integrity Goals: Patient/caregiver will verbalize understanding of skin care regimen Date Initiated: 06/03/2021 Target Resolution Date: 06/03/2021 Goal Status: Active Interventions: Assess ulceration(s) every visit Provide education on ulcer and skin care Treatment Activities: Skin care regimen initiated : 06/03/2021 Topical wound management initiated : 06/03/2021 Notes: Electronic Signature(s) Signed: 08/27/2021 11:53:33 AM By: Levora Dredge Entered By: Levora Dredge on 08/27/2021 11:53:33 [name] ([id number]) -------------------------------------------------------------------------------- Pain Assessment Details Patient Name: [name]. Date of Service: 08/27/2021 10:30 AM Medical Record Number: [id number] Patient Account Number: [id number] Date  of Birth/Sex: 07/20/1955 (66 y.o. Female) Treating RN: Levora Dredge Primary Care Angelyn Osterberg: [name] Other Clinician: Referring Siara Gorder: [name] Treating Jadrian Bulman/Extender: Skipper Cliche in Treatment: 12 Active Problems Location of Pain Severity and Description of Pain Patient Has Paino Yes Site Locations Rate the  pain. Current Pain Level: 5 Pain Management and Medication Current Pain Management: Notes pt states pain in left leg Electronic Signature(s) Signed: 08/27/2021 12:21:05 PM By: Levora Dredge Entered By: Levora Dredge on 08/27/2021 10:29:23 [name] ([id number]) -------------------------------------------------------------------------------- Patient/Caregiver Education Details Patient Name: [name]. Date of Service: 08/27/2021 10:30 AM Medical Record Number: [id number] Patient Account Number: [id number] Date of Birth/Gender: 09/03/1955 (66 y.o. Female) Treating RN: Levora Dredge Primary Care Physician: [name] Other Clinician: Referring Physician: [name] Treating Physician/Extender: Skipper Cliche in Treatment: 12 Education Assessment Education Provided To: Patient Education Topics Provided Wound/Skin Impairment: Handouts: Caring for Your Ulcer Methods: Explain/Verbal Responses: State content correctly Electronic Signature(s) Signed: 08/27/2021 12:21:05 PM By: Levora Dredge Entered By: Levora Dredge on 08/27/2021 11:59:12 [name] ([id number]) -------------------------------------------------------------------------------- Wound Assessment Details Patient Name: [name]. Date of Service: 08/27/2021 10:30 AM Medical Record Number: [id number] Patient Account Number: [id number] Date of Birth/Sex: 1955/12/25 (66 y.o. Female) Treating RN: Levora Dredge Primary Care Delayni Streed: [name] Other Clinician: Referring Kyce Ging: [name] Treating Ruby Logiudice/Extender: Jeri Cos Weeks in Treatment: 12 Wound Status Wound Number: 60 Primary Pressure Ulcer Etiology: Wound Location: Right, Posterior Lower Leg Wound Open Wounding Event: Pressure Injury Status: Date Acquired: 05/17/2021 Comorbid Lymphedema, Hypertension, Peripheral Arterial Disease, Weeks Of Treatment: 12 History: Peripheral Venous Disease, Type II  Diabetes, End Stage Clustered Wound: No Renal Disease, Osteoarthritis, Neuropathy Photos Wound Measurements Length: (cm) 13 % Reduc Width: (cm) 12 % Reduc Depth: (cm) 0.1 Epithel Area: (cm) 122.522 Tunnel Volume: (cm) 12.252 Underm tion in Area: -21585.3% tion in Volume: -21394.7% ialization: None ing: No ining: No Wound Description Classification: Category/Stage III Foul Od Wound Margin: Indistinct, nonvisible Slough/ Exudate Amount: Large Exudate Type: Sanguinous Exudate Color: red or After Cleansing: No Fibrino No Wound Bed Granulation Amount: Large (67-100%) Exposed Structure Granulation Quality: Friable Fascia Exposed: No Necrotic Amount: Small (1-33%) Fat Layer (Subcutaneous Tissue) Exposed: Yes Necrotic Quality: Eschar Tendon Exposed: No Muscle Exposed: No Joint Exposed: No Bone Exposed: No Assessment Notes clustered wounds throughout back of right leg Treatment Notes Wound #60 (Lower Leg) Wound Laterality: Right, Posterior Cleanser Normal Saline Wignall, Sawyer K. ([id number]) Discharge Instruction: Wash your hands with soap and water. Remove old dressing, discard into plastic bag and place into trash. Cleanse the wound with Normal Saline prior to applying a clean dressing using gauze sponges, not tissues or cotton balls. Do not scrub or use excessive force. Pat dry using gauze sponges, not tissue or cotton balls. Peri-Wound Care Topical Primary Dressing Xeroform 5x9-HBD (in/in) Discharge Instruction: Apply Xeroform 5x9-HBD (in/in) as directed Secondary Dressing Gauze Discharge Instruction: As directed: dry, moistened with saline or moistened with Dakins Solution Secured With 19M Medipore H Soft Cloth Surgical Tape, 2x2 (in/yd) Coban Cohesive Bandage 4x5 (yds) Stretched Discharge Instruction: Apply Coban as directed. Kerlix Roll Sterile or Non-Sterile 6-ply 4.5x4 (yd/yd) Discharge Instruction: Apply Kerlix as directed Compression Wrap Compression  Stockings Add-Ons Electronic Signature(s) Signed: 08/27/2021 12:21:05 PM By: Levora Dredge Entered By: Levora Dredge on 08/27/2021 11:10:43 [name] ([id number]) -------------------------------------------------------------------------------- Wound Assessment Details Patient Name: [name]. Date of Service: 08/27/2021 10:30 AM Medical Record Number: [id number] Patient Account Number: [id number] Date of Birth/Sex: 1956-05-13 (66 y.o. Female)  Treating RN: Levora Dredge Primary Care Ransom Nickson: [name] Other Clinician: Referring Rubye Strohmeyer: [name] Treating Judaea Burgoon/Extender: Jeri Cos Weeks in Treatment: 12 Wound Status Wound Number: 61 Primary Lymphedema Etiology: Wound Location: Left, Posterior Lower Leg Wound Open Wounding Event: Gradually Appeared Status: Date Acquired: 06/11/2021 Comorbid Lymphedema, Hypertension, Peripheral Arterial Disease, Weeks Of Treatment: 9 History: Peripheral Venous Disease, Type II Diabetes, End Stage Clustered Wound: No Renal Disease, Osteoarthritis, Neuropathy Photos Wound Measurements Length: (cm) 9 Width: (cm) 7 Depth: (cm) 0.1 Area: (cm) 49.48 Volume: (cm) 4.948 % Reduction in Area: -55.6% % Reduction in Volume: -55.5% Epithelialization: None Tunneling: No Undermining: No Wound Description Classification: Full Thickness Without Exposed Support Structures Exudate Amount: Large Exudate Type: Sanguinous Exudate Color: red Foul Odor After Cleansing: No Slough/Fibrino No Wound Bed Granulation Amount: Large (67-100%) Exposed Structure Granulation Quality: Friable Fat Layer (Subcutaneous Tissue) Exposed: Yes Necrotic Amount: None Present (0%) Treatment Notes Wound #61 (Lower Leg) Wound Laterality: Left, Posterior Cleanser Normal Saline Discharge Instruction: Wash your hands with soap and water. Remove old dressing, discard into plastic bag and place into trash. Cleanse the wound with Normal Saline  prior to applying a clean dressing using gauze sponges, not tissues or cotton balls. Do not scrub or use excessive force. Pat dry using gauze sponges, not tissue or cotton balls. Peri-Wound Care Topical ALEKSANDRA, RABEN. ([id number]) Primary Dressing Xeroform 5x9-HBD (in/in) Discharge Instruction: Apply Xeroform 5x9-HBD (in/in) as directed Secondary Dressing Gauze Discharge Instruction: As directed: dry, moistened with saline or moistened with Dakins Solution Secured With 64M Medipore H Soft Cloth Surgical Tape, 2x2 (in/yd) Coban Cohesive Bandage 4x5 (yds) Stretched Discharge Instruction: Apply Coban as directed. Kerlix Roll Sterile or Non-Sterile 6-ply 4.5x4 (yd/yd) Discharge Instruction: Apply Kerlix as directed Compression Wrap Compression Stockings Add-Ons Electronic Signature(s) Signed: 08/27/2021 12:21:05 PM By: Levora Dredge Entered By: Levora Dredge on 08/27/2021 11:11:27 [name] ([id number]) -------------------------------------------------------------------------------- Wound Assessment Details Patient Name: [name]. Date of Service: 08/27/2021 10:30 AM Medical Record Number: [id number] Patient Account Number: [id number] Date of Birth/Sex: 1955-09-19 (66 y.o. Female) Treating RN: Levora Dredge Primary Care Payslee Bateson: [name] Other Clinician: Referring Susannah Carbin: [name] Treating Lorien Shingler/Extender: Jeri Cos Weeks in Treatment: 12 Wound Status Wound Number: 62 Primary Lymphedema Etiology: Wound Location: Left, Midline, Posterior Lower Leg Wound Open Wounding Event: Gradually Appeared Status: Date Acquired: 06/11/2021 Comorbid Lymphedema, Hypertension, Peripheral Arterial Disease, Weeks Of Treatment: 9 History: Peripheral Venous Disease, Type II Diabetes, End Stage Clustered Wound: No Renal Disease, Osteoarthritis, Neuropathy Photos Wound Measurements Length: (cm) 7 Width: (cm) 3 Depth: (cm) 0.1 Area: (cm) 16.493 Volume:  (cm) 1.649 % Reduction in Area: -1664% % Reduction in Volume: -1673.1% Epithelialization: None Tunneling: No Undermining: No Wound Description Classification: Full Thickness Without Exposed Support Structures Exudate Amount: Large Exudate Type: Sanguinous Exudate Color: red Foul Odor After Cleansing: No Slough/Fibrino No Wound Bed Granulation Amount: Large (67-100%) Exposed Structure Granulation Quality: Friable Fat Layer (Subcutaneous Tissue) Exposed: Yes Necrotic Amount: None Present (0%) Treatment Notes Wound #62 (Lower Leg) Wound Laterality: Left, Midline, Posterior Cleanser Normal Saline Discharge Instruction: Wash your hands with soap and water. Remove old dressing, discard into plastic bag and place into trash. Cleanse the wound with Normal Saline prior to applying a clean dressing using gauze sponges, not tissues or cotton balls. Do not scrub or use excessive force. Pat dry using gauze sponges, not tissue or cotton balls. Peri-Wound Care Topical [name], [name] ([id number]) Primary Dressing Xeroform 5x9-HBD (in/in) Discharge Instruction: Apply Xeroform 5x9-HBD (in/in) as directed  Secondary Dressing Gauze Discharge Instruction: As directed: dry, moistened with saline or moistened with Dakins Solution Secured With 42M Medipore H Soft Cloth Surgical Tape, 2x2 (in/yd) Coban Cohesive Bandage 4x5 (yds) Stretched Discharge Instruction: Apply Coban as directed. Kerlix Roll Sterile or Non-Sterile 6-ply 4.5x4 (yd/yd) Discharge Instruction: Apply Kerlix as directed Compression Wrap Compression Stockings Add-Ons Electronic Signature(s) Signed: 08/27/2021 12:21:05 PM By: Levora Dredge Entered By: Levora Dredge on 08/27/2021 11:12:15 [name] ([id number]) -------------------------------------------------------------------------------- Wound Assessment Details Patient Name: [name]. Date of Service: 08/27/2021 10:30 AM Medical Record Number:  [id number] Patient Account Number: [id number] Date of Birth/Sex: [birth year] (66 y.o. Female) Treating RN: Levora Dredge Primary Care Sheyann Sulton: [name] Other Clinician: Referring Neaveh Belanger: [name] Treating Arna Luis/Extender: Jeri Cos Weeks in Treatment: 12 Wound Status Wound Number: 63 Primary Open Surgical Wound Etiology: Wound Location: Left Calcaneus Secondary Diabetic Wound/Ulcer of the Lower Extremity Wounding Event: Gradually Appeared Etiology: Date Acquired: 04/27/2021 Wound Open Weeks Of Treatment: 6 Status: Clustered Wound: No Comorbid Lymphedema, Hypertension, Peripheral Arterial Disease, History: Peripheral Venous Disease, Type II Diabetes, End Stage Renal Disease, Osteoarthritis, Neuropathy Photos Wound Measurements Length: (cm) 3.7 Width: (cm) 2 Depth: (cm) 0.2 Area: (cm) 5.812 Volume: (cm) 1.162 % Reduction in Area: 53% % Reduction in Volume: 53% Epithelialization: None Tunneling: No Undermining: No Wound Description Classification: Full Thickness Without Exposed Support Structures Exudate Amount: Medium Exudate Type: Serosanguineous Exudate Color: red, brown Foul Odor After Cleansing: No Slough/Fibrino No Wound Bed Granulation Amount: Large (67-100%) Exposed Structure Granulation Quality: Red, Friable Fat Layer (Subcutaneous Tissue) Exposed: Yes Necrotic Amount: Small (1-33%) Necrotic Quality: Adherent Slough Treatment Notes Wound #63 (Calcaneus) Wound Laterality: Left Cleanser Normal Saline Discharge Instruction: Wash your hands with soap and water. Remove old dressing, discard into plastic bag and place into trash. Cleanse the wound with Normal Saline prior to applying a clean dressing using gauze sponges, not tissues or cotton balls. Do not scrub or use excessive force. Pat dry using gauze sponges, not tissue or cotton balls. Peri-Wound Care [name], [name] ([id number]) Topical Primary Dressing Xeroform 5x9-HBD  (in/in) Discharge Instruction: Apply Xeroform 5x9-HBD (in/in) as directed Secondary Dressing Gauze Discharge Instruction: As directed: dry, moistened with saline or moistened with Dakins Solution Secured With 42M Medipore H Soft Cloth Surgical Tape, 2x2 (in/yd) Coban Cohesive Bandage 4x5 (yds) Stretched Discharge Instruction: Apply Coban as directed. Kerlix Roll Sterile or Non-Sterile 6-ply 4.5x4 (yd/yd) Discharge Instruction: Apply Kerlix as directed Compression Wrap Compression Stockings Add-Ons Electronic Signature(s) Signed: 08/27/2021 12:21:05 PM By: Levora Dredge Entered By: Levora Dredge on 08/27/2021 11:13:09 [name] ([id number]) -------------------------------------------------------------------------------- Wound Assessment Details Patient Name: [name]. Date of Service: 08/27/2021 10:30 AM Medical Record Number: [id number] Patient Account Number: [id number] Date of Birth/Sex: 1955/09/12 (66 y.o. Female) Treating RN: Levora Dredge Primary Care Blease Capaldi: [name] Other Clinician: Referring Eriyonna Matsushita: [name] Treating Shaddai Shapley/Extender: Jeri Cos Weeks in Treatment: 12 Wound Status Wound Number: 64 Primary Pressure Ulcer Etiology: Wound Location: Left Gluteus Wound Open Wounding Event: Gradually Appeared Status: Date Acquired: 07/28/2021 Comorbid Lymphedema, Hypertension, Peripheral Arterial Disease, Weeks Of Treatment: 0 History: Peripheral Venous Disease, Type II Diabetes, End Stage Clustered Wound: No Renal Disease, Osteoarthritis, Neuropathy Photos Wound Measurements Length: (cm) 1 Width: (cm) 1 Depth: (cm) 0.1 Area: (cm) 0.785 Volume: (cm) 0.079 % Reduction in Area: % Reduction in Volume: Epithelialization: None Tunneling: No Undermining: No Wound Description Classification: Category/Stage II Exudate Amount: Medium Exudate Type: Serosanguineous Exudate Color: red, brown Foul Odor After Cleansing:  No Slough/Fibrino Yes  Wound Bed Granulation Amount: Medium (34-66%) Exposed Structure Granulation Quality: Red Fat Layer (Subcutaneous Tissue) Exposed: Yes Necrotic Amount: Small (1-33%) Necrotic Quality: Adherent Slough Treatment Notes Wound #64 (Gluteus) Wound Laterality: Left Cleanser Normal Saline Discharge Instruction: Wash your hands with soap and water. Remove old dressing, discard into plastic bag and place into trash. Cleanse the wound with Normal Saline prior to applying a clean dressing using gauze sponges, not tissues or cotton balls. Do not scrub or use excessive force. Pat dry using gauze sponges, not tissue or cotton balls. Peri-Wound Care Topical [name], [name] ([id number]) Primary Dressing Silvercel Small 2x2 (in/in) Discharge Instruction: Apply Silvercel Small 2x2 (in/in) as instructed Secondary Dressing Gauze Discharge Instruction: As directed: dry, moistened with saline or moistened with Dakins Solution Zetuvit Plus Silicone Border Dressing 5x5 (in/in) Secured With Compression Wrap Compression Stockings Add-Ons Electronic Signature(s) Signed: 08/27/2021 12:21:05 PM By: Levora Dredge Entered By: Levora Dredge on 08/27/2021 11:19:55 [name] ([id number]) -------------------------------------------------------------------------------- Vitals Details Patient Name: [name]. Date of Service: 08/27/2021 10:30 AM Medical Record Number: [id number] Patient Account Number: [id number] Date of Birth/Sex: October 11, 1955 (66 y.o. Female) Treating RN: Levora Dredge Primary Care Sylvestre Rathgeber: [name] Other Clinician: Referring Montravious Weigelt: [name] Treating Clarisse Rodriges/Extender: Skipper Cliche in Treatment: 12 Vital Signs Time Taken: 10:28 Temperature (F): 98.3 Height (in): 65 Respiratory Rate (breaths/min): 18 Weight (lbs): 245.4 Blood Pressure (mmHg): 108/63 Body Mass Index (BMI): 40.8 Reference Range: 80 - 120 mg / dl Electronic  Signature(s) Signed: 08/27/2021 12:21:05 PM By: Levora Dredge Entered By: Levora Dredge on 08/27/2021 10:28:59

## 2021-08-27 NOTE — Progress Notes (Addendum)
BEVA, REMUND ([id number]) Visit Report for 08/27/2021 Chief Complaint Document Details Patient Name: [name], [name]. Date of Service: 08/27/2021 10:30 AM Medical Record Number: [id number] Patient Account Number: [id number] Date of Birth/Sex: 1956/01/05 (66 y.o. 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 Electronic Signature(s) Signed: 08/27/2021 10:36:25 AM By: Worthy Keeler PA-C Entered By: Worthy Keeler on 08/27/2021 10:36:25 [name] ([id number]) -------------------------------------------------------------------------------- HPI Details Patient Name: [name] Date of Service: 08/27/2021 10:30 AM Medical Record Number: [id number] Patient Account Number: [id number] Date of Birth/Sex: 1956/06/05 (66 y.o. F) Treating RN: Levora Dredge Primary Care Provider: [name] Other Clinician: Referring Provider: [name] Treating Provider/Extender: Skipper Cliche in Treatment: 12 History of Present Illness HPI Description: 66yo w/ h/o BLE phlebolymphedema, type 2 DM (unknown hemoglobin A1c), and obesity. No PVD. h/o chronic, recurrent BLE calf ulcers. Treated with 4 layer compression. Infrequently uses lymphedema pump. Bilateral lower extremity ulcerations healed as of June 2016. Fitted for custom compression stockings but did not receive them. Patient could not travel for lymphedema consult. She developed recurrent bilateral lower extremity ulcerations in August 2016. She is able to heal these quite quickly with 4 layer elastic compression bandages. However, she is not very compliant using her juxtalite compression garments and tends to develop recurrent ulcerations while using  juxtalite. Unna boots were applied this past week. She tolerated this well but performs minimal ambulation. She is scheduled to undergo total hip replacement tomorrow with Dr. Rudene Christians. She is unsure if he is aware of her ulcerations. She is otherwise without complaints. No significant pain. No fever or chills. Moderate clear drainage. Not on any antibiotics. 06/04/2015 -- she did have her hip surgery and has now been in rehabilitation and they're controlling her fluid intake and diuretics to the extent that she has lost 13 pounds of weight and her lower extremity circumference is decreased by 7 cm. 06/18/2015 -- she is still in the rehabilitation facility and continues to be looked after well and has lost a total of 15 pounds and her diabetes is under much better control. Readmission: 06/03/2021 upon evaluation today patient actually appears to be doing somewhat poorly in regard to a wound on the right posterior lower leg. Patient does have a history of previously having been seen here in the clinic but this predates my time here back in 2017. Subsequently unfortunately right now she is having a significant issue with bleeding in the posterior of her right calf this appears to be a deep tissue injury as developed into an ulceration. She does have a history significant for lymphedema, diabetes mellitus type 2, diabetic neuropathy, hypertension, hypothyroidism, and obesity. She is currently in a skilled nursing facility. The dressing that she had in place during evaluation today was Xeroform followed by significant amount of gauze and this was severely stuck. Upon removal the patient was having significant bleeding she also had significant work done in the hospital in regard to her blood flow and Dr. Lucky Cowboy does look like he has her on 2 blood thinners Plavix and Lovenox. This is probably part of the reason why she is bleeding so significantly. With that being said I do believe that based on what we are  seeing currently this is probably can to be something that were getting have to try to get stopped  with more than just pressure to the region currently. I think surgery foam is probably can to be necessary. If we can get the stop of the Surgifoam she is probably can have to go to the hospital to try to get this bleeding stopped. 12/30; this is a patient who is currently in a skilled facility. She has wounds on her bilateral calfs likely secondary to severe chronic venous insufficiency and lymphedema the care of these wounds is complicated by the fact that she is on Lovenox and Plavix and bleeds very frequently with even minimal trauma to these wounds. It would appear that she had a Vaseline gauze on the surface of these in the process of undressing them she bled freely from especially the right but also the left. I had to go in and use silver nitrate. The skin on her legs is significant for chronic venous stasis and lymphedema. Very dry flaking skin 07/16/2021; I actually was a patent person who saw this woman 3 weeks ago. She is a nursing facility has chronic venous wounds and bilateral lower extremity wounds. We use Xeroform under compression. The time she was on Lovenox and Plavix I thought I had an excellent reason for these wounds to bleed so freely. Since then the Lovenox has been stopped and should be well out of her system but she is still on Plavix. She still has very friable bleeding wounds on the left lateral left Posterior and right Posterior lower extremity.These believe very freely when we take off the dressing Also she has a left heel wound which when we saw her last time was a surgical wound however I gather we have been asked to follow this as well. I am not exactly sure at this time what her surgery was but she comes in with a wound VAC on this and they are apparently changing it at the nursing home. 08/27/2021 upon evaluation patient appears to be doing better in regard to her wound  on the legs as well as the heel. Fortunately at this point I do not see any need for a wound VAC which is good news overall I think that the Xeroform is probably a good option that is what is being utilized here as well. Fortunately I do not see any signs of active infection locally nor systemically which is great news and overall I think that the patient is making excellent progress. The area on her right gluteal area actually appears to be doing quite well and I am pleased with that. I do believe the silver alginate dressing here could be of benefit for her. Electronic Signature(s) Signed: 08/27/2021 1:12:31 PM By: Worthy Keeler PA-C Entered By: Worthy Keeler on 08/27/2021 13:12:31 [name] ([id number]) -------------------------------------------------------------------------------- Physical Exam Details Patient Name: [name] Date of Service: 08/27/2021 10:30 AM Medical Record Number: [id number] Patient Account Number: [id number] Date of Birth/Sex: 02-Apr-1956 (66 y.o. F) Treating RN: Levora Dredge Primary Care Provider: [name] Other Clinician: Referring Provider: [name] Treating Provider/Extender: Jeri Cos Weeks in Treatment: 12 Constitutional Obese and well-hydrated in no acute distress. Respiratory normal breathing without difficulty. Psychiatric this patient is able to make decisions and demonstrates good insight into disease process. Alert and Oriented x 3. pleasant and cooperative. Notes Upon inspection patient's wound bed actually showed signs of good granulation epithelization at this point. Fortunately I do not see any evidence of active infection locally nor systemically and all of her wounds are showing signs of improvement. Obviously the wound  on her gluteal region is new. With that being said this is very superficial and honestly I think this is going to actually heal quite nicely based on what I am seeing. Electronic  Signature(s) Signed: 08/27/2021 1:14:16 PM By: Worthy Keeler PA-C Entered By: Worthy Keeler on 08/27/2021 13:14:16 [name] ([id number]) -------------------------------------------------------------------------------- Physician Orders Details Patient Name: [name] Date of Service: 08/27/2021 10:30 AM Medical Record Number: [id number] Patient Account Number: [id number] Date of Birth/Sex: August 06, 1955 (66 y.o. F) Treating RN: Levora Dredge Primary Care Provider: [name] Other Clinician: Referring Provider: [name] Treating Provider/Extender: Skipper Cliche in Treatment: 12 Verbal / Phone Orders: No Diagnosis Coding ICD-10 Coding Code Description I89.0 Lymphedema, not elsewhere classified E11.622 Type 2 diabetes mellitus with other skin ulcer E11.40 Type 2 diabetes mellitus with diabetic neuropathy, unspecified L89.893 Pressure ulcer of other site, stage 3 L89.896 Pressure-induced deep tissue damage of other site I10 Essential (primary) hypertension E03.8 Other specified hypothyroidism E66.09 Other obesity due to excess calories Follow-up Appointments Wound #60 Right,Posterior Lower Leg o Return Appointment in 3 weeks. Bathing/ Shower/ Hygiene o Clean wound with Normal Saline or wound cleanser. o No tub bath. Edema Control - Lymphedema / Segmental Compressive Device / Other o Other: - Lymphedema-use ace wraps to help control swelling. Off-Loading o Other: - Put pillows under knees to keep pressure off of calves. Deep tissue injury on posterior calves. Additional Orders / Instructions o Follow Nutritious Diet and Increase Protein Intake Negative Pressure Wound Therapy Wound #63 Left Calcaneus o Discontinue NPWT. Wound Treatment Wound #60 - Lower Leg Wound Laterality: Right, Posterior Cleanser: Normal Saline Every Other Day/30 Days Discharge Instructions: Wash your hands with soap and water. Remove old dressing, discard into  plastic bag and place into trash. Cleanse the wound with Normal Saline prior to applying a clean dressing using gauze sponges, not tissues or cotton balls. Do not scrub or use excessive force. Pat dry using gauze sponges, not tissue or cotton balls. Primary Dressing: Xeroform 5x9-HBD (in/in) Every Other Day/30 Days Discharge Instructions: Apply Xeroform 5x9-HBD (in/in) as directed Secondary Dressing: Gauze Every Other Day/30 Days Discharge Instructions: As directed: dry, moistened with saline or moistened with Dakins Solution Secured With: 339M Medipore H Soft Cloth Surgical Tape, 2x2 (in/yd) Every Other Day/30 Days Secured With: Coban Cohesive Bandage 4x5 (yds) Stretched Every Other Day/30 Days Discharge Instructions: Apply Coban as directed. Secured With: [employer] or Non-Sterile 6-ply 4.5x4 (yd/yd) Every Other Day/30 Days [name], [name] ([id number]) Discharge Instructions: Apply Kerlix as directed Wound #61 - Lower Leg Wound Laterality: Left, Posterior Cleanser: Normal Saline Every Other Day/30 Days Discharge Instructions: Wash your hands with soap and water. Remove old dressing, discard into plastic bag and place into trash. Cleanse the wound with Normal Saline prior to applying a clean dressing using gauze sponges, not tissues or cotton balls. Do not scrub or use excessive force. Pat dry using gauze sponges, not tissue or cotton balls. Primary Dressing: Xeroform 5x9-HBD (in/in) Every Other Day/30 Days Discharge Instructions: Apply Xeroform 5x9-HBD (in/in) as directed Secondary Dressing: Gauze Every Other Day/30 Days Discharge Instructions: As directed: dry, moistened with saline or moistened with Dakins Solution Secured With: 339M Medipore H Soft Cloth Surgical Tape, 2x2 (in/yd) Every Other Day/30 Days Secured With: Coban Cohesive Bandage 4x5 (yds) Stretched Every Other Day/30 Days Discharge Instructions: Apply Coban as directed. Secured With: [employer] or  Non-Sterile 6-ply 4.5x4 (yd/yd) Every Other Day/30 Days Discharge Instructions: Apply Kerlix as directed  Wound #62 - Lower Leg Wound Laterality: Left, Midline, Posterior Cleanser: Normal Saline Every Other Day/30 Days Discharge Instructions: Wash your hands with soap and water. Remove old dressing, discard into plastic bag and place into trash. Cleanse the wound with Normal Saline prior to applying a clean dressing using gauze sponges, not tissues or cotton balls. Do not scrub or use excessive force. Pat dry using gauze sponges, not tissue or cotton balls. Primary Dressing: Xeroform 5x9-HBD (in/in) Every Other Day/30 Days Discharge Instructions: Apply Xeroform 5x9-HBD (in/in) as directed Secondary Dressing: Gauze Every Other Day/30 Days Discharge Instructions: As directed: dry, moistened with saline or moistened with Dakins Solution Secured With: 228M Medipore H Soft Cloth Surgical Tape, 2x2 (in/yd) Every Other Day/30 Days Secured With: Coban Cohesive Bandage 4x5 (yds) Stretched Every Other Day/30 Days Discharge Instructions: Apply Coban as directed. Secured With: [employer] or Non-Sterile 6-ply 4.5x4 (yd/yd) Every Other Day/30 Days Discharge Instructions: Apply Kerlix as directed Wound #63 - Calcaneus Wound Laterality: Left Cleanser: Normal Saline Every Other Day/30 Days Discharge Instructions: Wash your hands with soap and water. Remove old dressing, discard into plastic bag and place into trash. Cleanse the wound with Normal Saline prior to applying a clean dressing using gauze sponges, not tissues or cotton balls. Do not scrub or use excessive force. Pat dry using gauze sponges, not tissue or cotton balls. Primary Dressing: Xeroform 5x9-HBD (in/in) Every Other Day/30 Days Discharge Instructions: Apply Xeroform 5x9-HBD (in/in) as directed Secondary Dressing: Gauze Every Other Day/30 Days Discharge Instructions: As directed: dry, moistened with saline or moistened with Dakins  Solution Secured With: 228M Medipore H Soft Cloth Surgical Tape, 2x2 (in/yd) Every Other Day/30 Days Secured With: Coban Cohesive Bandage 4x5 (yds) Stretched Every Other Day/30 Days Discharge Instructions: Apply Coban as directed. Secured With: [employer] or Non-Sterile 6-ply 4.5x4 (yd/yd) Every Other Day/30 Days Discharge Instructions: Apply Kerlix as directed Wound #64 - Gluteus Wound Laterality: Left Cleanser: Normal Saline Every Other Day/30 Days KAIESHA, TONNER ([id number]) Discharge Instructions: Wash your hands with soap and water. Remove old dressing, discard into plastic bag and place into trash. Cleanse the wound with Normal Saline prior to applying a clean dressing using gauze sponges, not tissues or cotton balls. Do not scrub or use excessive force. Pat dry using gauze sponges, not tissue or cotton balls. Primary Dressing: Silvercel Small 2x2 (in/in) Every Other Day/30 Days Discharge Instructions: Apply Silvercel Small 2x2 (in/in) as instructed Secondary Dressing: Gauze Every Other Day/30 Days Discharge Instructions: As directed: dry, moistened with saline or moistened with Dakins Solution Secondary Dressing: Zetuvit Plus Silicone Border Dressing 5x5 (in/in) Every Other Day/30 Days Electronic Signature(s) Signed: 08/27/2021 12:21:05 PM By: Levora Dredge Signed: 08/27/2021 5:14:00 PM By: Worthy Keeler PA-C Previous Signature: 08/27/2021 11:50:43 AM Version By: Levora Dredge Entered By: Levora Dredge on 08/27/2021 11:56:09 [name] ([id number]) -------------------------------------------------------------------------------- Problem List Details Patient Name: [name]. Date of Service: 08/27/2021 10:30 AM Medical Record Number: [id number] Patient Account Number: [id number] Date of Birth/Sex: 1955-10-17 (66 y.o. F) Treating RN: Levora Dredge Primary Care Provider: [name] Other Clinician: Referring Provider: [name] Treating  Provider/Extender: Skipper Cliche in Treatment: 12 Active Problems ICD-10 Encounter Code Description Active Date MDM Diagnosis I89.0 Lymphedema, not elsewhere classified 06/03/2021 No Yes E11.622 Type 2 diabetes mellitus with other skin ulcer 06/03/2021 No Yes E11.40 Type 2 diabetes mellitus with diabetic neuropathy, unspecified 06/03/2021 No Yes L89.893 Pressure ulcer of other site, stage 3 06/03/2021 No Yes L89.896 Pressure-induced  deep tissue damage of other site 06/03/2021 No Yes L89.322 Pressure ulcer of left buttock, stage 2 08/27/2021 No Yes I10 Essential (primary) hypertension 06/03/2021 No Yes E03.8 Other specified hypothyroidism 06/03/2021 No Yes E66.09 Other obesity due to excess calories 06/03/2021 No Yes Inactive Problems Resolved Problems Electronic Signature(s) Signed: 08/27/2021 1:14:01 PM By: Worthy Keeler PA-C Previous Signature: 08/27/2021 10:36:20 AM Version By: Worthy Keeler PA-C Entered By: Worthy Keeler on 08/27/2021 13:14:01 [name] ([id number]) -------------------------------------------------------------------------------- Progress Note Details Patient Name: [name]. Date of Service: 08/27/2021 10:30 AM Medical Record Number: [id number] Patient Account Number: [id number] Date of Birth/Sex: 07/07/1955 (66 y.o. F) Treating RN: Levora Dredge Primary Care Provider: [name] Other Clinician: Referring Provider: [name] Treating Provider/Extender: Skipper Cliche in Treatment: 12 Subjective Chief Complaint Information obtained from Patient Bilateral lower extremity phlebolymphedema and recurrent calf ulcerations. History of Present Illness (HPI) 66yo w/ h/o BLE phlebolymphedema, type 2 DM (unknown hemoglobin A1c), and obesity. No PVD. h/o chronic, recurrent BLE calf ulcers. Treated with 4 layer compression. Infrequently uses lymphedema pump. Bilateral lower extremity ulcerations healed as of June 2016. Fitted for custom  compression stockings but did not receive them. Patient could not travel for lymphedema consult. She developed recurrent bilateral lower extremity ulcerations in August 2016. She is able to heal these quite quickly with 4 layer elastic compression bandages. However, she is not very compliant using her juxtalite compression garments and tends to develop recurrent ulcerations while using juxtalite. Unna boots were applied this past week. She tolerated this well but performs minimal ambulation. She is scheduled to undergo total hip replacement tomorrow with Dr. Rudene Christians. She is unsure if he is aware of her ulcerations. She is otherwise without complaints. No significant pain. No fever or chills. Moderate clear drainage. Not on any antibiotics. 06/04/2015 -- she did have her hip surgery and has now been in rehabilitation and they're controlling her fluid intake and diuretics to the extent that she has lost 13 pounds of weight and her lower extremity circumference is decreased by 7 cm. 06/18/2015 -- she is still in the rehabilitation facility and continues to be looked after well and has lost a total of 15 pounds and her diabetes is under much better control. Readmission: 06/03/2021 upon evaluation today patient actually appears to be doing somewhat poorly in regard to a wound on the right posterior lower leg. Patient does have a history of previously having been seen here in the clinic but this predates my time here back in 2017. Subsequently unfortunately right now she is having a significant issue with bleeding in the posterior of her right calf this appears to be a deep tissue injury as developed into an ulceration. She does have a history significant for lymphedema, diabetes mellitus type 2, diabetic neuropathy, hypertension, hypothyroidism, and obesity. She is currently in a skilled nursing facility. The dressing that she had in place during evaluation today was Xeroform followed by significant amount  of gauze and this was severely stuck. Upon removal the patient was having significant bleeding she also had significant work done in the hospital in regard to her blood flow and Dr. Lucky Cowboy does look like he has her on 2 blood thinners Plavix and Lovenox. This is probably part of the reason why she is bleeding so significantly. With that being said I do believe that based on what we are seeing currently this is probably can to be something that were getting have to try to get stopped with  more than just pressure to the region currently. I think surgery foam is probably can to be necessary. If we can get the stop of the Surgifoam she is probably can have to go to the hospital to try to get this bleeding stopped. 12/30; this is a patient who is currently in a skilled facility. She has wounds on her bilateral calfs likely secondary to severe chronic venous insufficiency and lymphedema the care of these wounds is complicated by the fact that she is on Lovenox and Plavix and bleeds very frequently with even minimal trauma to these wounds. It would appear that she had a Vaseline gauze on the surface of these in the process of undressing them she bled freely from especially the right but also the left. I had to go in and use silver nitrate. The skin on her legs is significant for chronic venous stasis and lymphedema. Very dry flaking skin 07/16/2021; I actually was a patent person who saw this woman 3 weeks ago. She is a nursing facility has chronic venous wounds and bilateral lower extremity wounds. We use Xeroform under compression. The time she was on Lovenox and Plavix I thought I had an excellent reason for these wounds to bleed so freely. Since then the Lovenox has been stopped and should be well out of her system but she is still on Plavix. She still has very friable bleeding wounds on the left lateral left Posterior and right Posterior lower extremity.These believe very freely when we take off the  dressing Also she has a left heel wound which when we saw her last time was a surgical wound however I gather we have been asked to follow this as well. I am not exactly sure at this time what her surgery was but she comes in with a wound VAC on this and they are apparently changing it at the nursing home. 08/27/2021 upon evaluation patient appears to be doing better in regard to her wound on the legs as well as the heel. Fortunately at this point I do not see any need for a wound VAC which is good news overall I think that the Xeroform is probably a good option that is what is being utilized here as well. Fortunately I do not see any signs of active infection locally nor systemically which is great news and overall I think that the patient is making excellent progress. The area on her right gluteal area actually appears to be doing quite well and I am pleased with that. I do believe the silver alginate dressing here could be of benefit for her. [name], [name] ([id number]) Objective Constitutional Obese and well-hydrated in no acute distress. Vitals Time Taken: 10:28 AM, Height: 65 in, Weight: 245.4 lbs, BMI: 40.8, Temperature: 98.3 F, Respiratory Rate: 18 breaths/min, Blood Pressure: 108/63 mmHg. Respiratory normal breathing without difficulty. Psychiatric this patient is able to make decisions and demonstrates good insight into disease process. Alert and Oriented x 3. pleasant and cooperative. General Notes: Upon inspection patient's wound bed actually showed signs of good granulation epithelization at this point. Fortunately I do not see any evidence of active infection locally nor systemically and all of her wounds are showing signs of improvement. Obviously the wound on her gluteal region is new. With that being said this is very superficial and honestly I think this is going to actually heal quite nicely based on what I am seeing. Integumentary (Hair, Skin) Wound #60 status is Open.  Original cause of wound was Pressure  Injury. The date acquired was: 05/17/2021. The wound has been in treatment 12 weeks. The wound is located on the Right,Posterior Lower Leg. The wound measures 13cm length x 12cm width x 0.1cm depth; 122.522cm^2 area and 12.252cm^3 volume. There is Fat Layer (Subcutaneous Tissue) exposed. There is no tunneling or undermining noted. There is a large amount of sanguinous drainage noted. The wound margin is indistinct and nonvisible. There is large (67-100%) friable granulation within the wound bed. There is a small (1-33%) amount of necrotic tissue within the wound bed including Eschar. General Notes: clustered wounds throughout back of right leg Wound #61 status is Open. Original cause of wound was Gradually Appeared. The date acquired was: 06/11/2021. The wound has been in treatment 9 weeks. The wound is located on the Left,Posterior Lower Leg. The wound measures 9cm length x 7cm width x 0.1cm depth; 49.48cm^2 area and 4.948cm^3 volume. There is Fat Layer (Subcutaneous Tissue) exposed. There is no tunneling or undermining noted. There is a large amount of sanguinous drainage noted. There is large (67-100%) friable granulation within the wound bed. There is no necrotic tissue within the wound bed. Wound #62 status is Open. Original cause of wound was Gradually Appeared. The date acquired was: 06/11/2021. The wound has been in treatment 9 weeks. The wound is located on the Left,Midline,Posterior Lower Leg. The wound measures 7cm length x 3cm width x 0.1cm depth; 16.493cm^2 area and 1.649cm^3 volume. There is Fat Layer (Subcutaneous Tissue) exposed. There is no tunneling or undermining noted. There is a large amount of sanguinous drainage noted. There is large (67-100%) friable granulation within the wound bed. There is no necrotic tissue within the wound bed. Wound #63 status is Open. Original cause of wound was Gradually Appeared. The date acquired was: 04/27/2021.  The wound has been in treatment 6 weeks. The wound is located on the Left Calcaneus. The wound measures 3.7cm length x 2cm width x 0.2cm depth; 5.812cm^2 area and 1.162cm^3 volume. There is Fat Layer (Subcutaneous Tissue) exposed. There is no tunneling or undermining noted. There is a medium amount of serosanguineous drainage noted. There is large (67-100%) red, friable granulation within the wound bed. There is a small (1-33%) amount of necrotic tissue within the wound bed including Adherent Slough. Wound #64 status is Open. Original cause of wound was Gradually Appeared. The date acquired was: 07/28/2021. The wound is located on the Left Gluteus. The wound measures 1cm length x 1cm width x 0.1cm depth; 0.785cm^2 area and 0.079cm^3 volume. There is Fat Layer (Subcutaneous Tissue) exposed. There is no tunneling or undermining noted. There is a medium amount of serosanguineous drainage noted. There is medium (34-66%) red granulation within the wound bed. There is a small (1-33%) amount of necrotic tissue within the wound bed including Adherent Slough. Assessment Active Problems ICD-10 Lymphedema, not elsewhere classified Type 2 diabetes mellitus with other skin ulcer Type 2 diabetes mellitus with diabetic neuropathy, unspecified Pressure ulcer of other site, stage 3 Pressure-induced deep tissue damage of other site Pressure ulcer of left buttock, stage 2 Essential (primary) hypertension Other specified hypothyroidism Other obesity due to excess calories [name], [name] ([id number]) Plan Follow-up Appointments: Wound #60 Right,Posterior Lower Leg: Return Appointment in 3 weeks. Bathing/ Shower/ Hygiene: Clean wound with Normal Saline or wound cleanser. No tub bath. Edema Control - Lymphedema / Segmental Compressive Device / Other: Other: - Lymphedema-use ace wraps to help control swelling. Off-Loading: Other: - Put pillows under knees to keep pressure off of calves. Deep tissue injury  on posterior calves. Additional Orders / Instructions: Follow Nutritious Diet and Increase Protein Intake Negative Pressure Wound Therapy: Wound #63 Left Calcaneus: Discontinue NPWT. WOUND #60: - Lower Leg Wound Laterality: Right, Posterior Cleanser: Normal Saline Every Other Day/30 Days Discharge Instructions: Wash your hands with soap and water. Remove old dressing, discard into plastic bag and place into trash. Cleanse the wound with Normal Saline prior to applying a clean dressing using gauze sponges, not tissues or cotton balls. Do not scrub or use excessive force. Pat dry using gauze sponges, not tissue or cotton balls. Primary Dressing: Xeroform 5x9-HBD (in/in) Every Other Day/30 Days Discharge Instructions: Apply Xeroform 5x9-HBD (in/in) as directed Secondary Dressing: Gauze Every Other Day/30 Days Discharge Instructions: As directed: dry, moistened with saline or moistened with Dakins Solution Secured With: 44M Medipore H Soft Cloth Surgical Tape, 2x2 (in/yd) Every Other Day/30 Days Secured With: Coban Cohesive Bandage 4x5 (yds) Stretched Every Other Day/30 Days Discharge Instructions: Apply Coban as directed. Secured With: [employer] or Non-Sterile 6-ply 4.5x4 (yd/yd) Every Other Day/30 Days Discharge Instructions: Apply Kerlix as directed WOUND #61: - Lower Leg Wound Laterality: Left, Posterior Cleanser: Normal Saline Every Other Day/30 Days Discharge Instructions: Wash your hands with soap and water. Remove old dressing, discard into plastic bag and place into trash. Cleanse the wound with Normal Saline prior to applying a clean dressing using gauze sponges, not tissues or cotton balls. Do not scrub or use excessive force. Pat dry using gauze sponges, not tissue or cotton balls. Primary Dressing: Xeroform 5x9-HBD (in/in) Every Other Day/30 Days Discharge Instructions: Apply Xeroform 5x9-HBD (in/in) as directed Secondary Dressing: Gauze Every Other Day/30  Days Discharge Instructions: As directed: dry, moistened with saline or moistened with Dakins Solution Secured With: 44M Medipore H Soft Cloth Surgical Tape, 2x2 (in/yd) Every Other Day/30 Days Secured With: Coban Cohesive Bandage 4x5 (yds) Stretched Every Other Day/30 Days Discharge Instructions: Apply Coban as directed. Secured With: [employer] or Non-Sterile 6-ply 4.5x4 (yd/yd) Every Other Day/30 Days Discharge Instructions: Apply Kerlix as directed WOUND #62: - Lower Leg Wound Laterality: Left, Midline, Posterior Cleanser: Normal Saline Every Other Day/30 Days Discharge Instructions: Wash your hands with soap and water. Remove old dressing, discard into plastic bag and place into trash. Cleanse the wound with Normal Saline prior to applying a clean dressing using gauze sponges, not tissues or cotton balls. Do not scrub or use excessive force. Pat dry using gauze sponges, not tissue or cotton balls. Primary Dressing: Xeroform 5x9-HBD (in/in) Every Other Day/30 Days Discharge Instructions: Apply Xeroform 5x9-HBD (in/in) as directed Secondary Dressing: Gauze Every Other Day/30 Days Discharge Instructions: As directed: dry, moistened with saline or moistened with Dakins Solution Secured With: 44M Medipore H Soft Cloth Surgical Tape, 2x2 (in/yd) Every Other Day/30 Days Secured With: Coban Cohesive Bandage 4x5 (yds) Stretched Every Other Day/30 Days Discharge Instructions: Apply Coban as directed. Secured With: [employer] or Non-Sterile 6-ply 4.5x4 (yd/yd) Every Other Day/30 Days Discharge Instructions: Apply Kerlix as directed WOUND #63: - Calcaneus Wound Laterality: Left Cleanser: Normal Saline Every Other Day/30 Days Discharge Instructions: Wash your hands with soap and water. Remove old dressing, discard into plastic bag and place into trash. Cleanse the wound with Normal Saline prior to applying a clean dressing using gauze sponges, not tissues or cotton balls. Do not  scrub or use excessive force. Pat dry using gauze sponges, not tissue or cotton balls. Primary Dressing: Xeroform 5x9-HBD (in/in) Every Other Day/30 Days Discharge Instructions: Apply Xeroform  5x9-HBD (in/in) as directed Secondary Dressing: Gauze Every Other Day/30 Days Discharge Instructions: As directed: dry, moistened with saline or moistened with Dakins Solution Secured With: 87M Medipore H Soft Cloth Surgical Tape, 2x2 (in/yd) Every Other Day/30 Days Secured With: Coban Cohesive Bandage 4x5 (yds) Stretched Every Other Day/30 Days Discharge Instructions: Apply Coban as directed. Secured With: [employer] or Non-Sterile 6-ply 4.5x4 (yd/yd) Every Other Day/30 Days [name], [name] ([id number]) Discharge Instructions: Apply Kerlix as directed WOUND #64: - Gluteus Wound Laterality: Left Cleanser: Normal Saline Every Other Day/30 Days Discharge Instructions: Wash your hands with soap and water. Remove old dressing, discard into plastic bag and place into trash. Cleanse the wound with Normal Saline prior to applying a clean dressing using gauze sponges, not tissues or cotton balls. Do not scrub or use excessive force. Pat dry using gauze sponges, not tissue or cotton balls. Primary Dressing: Silvercel Small 2x2 (in/in) Every Other Day/30 Days Discharge Instructions: Apply Silvercel Small 2x2 (in/in) as instructed Secondary Dressing: Gauze Every Other Day/30 Days Discharge Instructions: As directed: dry, moistened with saline or moistened with Dakins Solution Secondary Dressing: Zetuvit Plus Silicone Border Dressing 5x5 (in/in) Every Other Day/30 Days 1. Would recommend currently that we go ahead and continue with the wound care measures as before and the patient is in agreement the plan. This includes the use of the silver alginate dressing which I think is good to be the best thing for the gluteal region. We use a border foam to cover. 2. I am also can recommend that we have the  patient continue with the Xeroform gauze pretty much at all other wound locations which seems to be doing quite well they are using Kerlix and Coban currently. 3. I would also recommend she continue with the Prevalon offloading boots. We will see patient back for reevaluation in 1 week here in the clinic. If anything worsens or changes patient will contact our office for additional recommendations. Electronic Signature(s) Signed: 08/27/2021 1:14:57 PM By: Worthy Keeler PA-C Entered By: Worthy Keeler on 08/27/2021 13:14:57 [name] ([id number]) -------------------------------------------------------------------------------- SuperBill Details Patient Name: [name] Date of Service: 08/27/2021 Medical Record Number: [id number] Patient Account Number: [id number] Date of Birth/Sex: 06-16-1956 (66 y.o. F) Treating RN: Levora Dredge Primary Care Provider: [name] Other Clinician: Referring Provider: [name] Treating Provider/Extender: Skipper Cliche in Treatment: 12 Diagnosis Coding ICD-10 Codes Code Description I89.0 Lymphedema, not elsewhere classified E11.622 Type 2 diabetes mellitus with other skin ulcer E11.40 Type 2 diabetes mellitus with diabetic neuropathy, unspecified L89.893 Pressure ulcer of other site, stage 3 L89.896 Pressure-induced deep tissue damage of other site I10 Essential (primary) hypertension E03.8 Other specified hypothyroidism E66.09 Other obesity due to excess calories Facility Procedures CPT4 Code: 90300923 Description: 99214 - WOUND CARE VISIT-LEV 4 EST PT Modifier: Quantity: 1 Physician Procedures CPT4 Code: 3007622 Description: 63335 - WC PHYS LEVEL 4 - EST PT Modifier: Quantity: 1 CPT4 Code: Description: ICD-10 Diagnosis Description I89.0 Lymphedema, not elsewhere classified E11.622 Type 2 diabetes mellitus with other skin ulcer E11.40 Type 2 diabetes mellitus with diabetic neuropathy, unspecified L89.893 Pressure  ulcer of other site, stage  3 Modifier: Quantity: Electronic Signature(s) Signed: 08/27/2021 1:15:22 PM By: Worthy Keeler PA-C Previous Signature: 08/27/2021 11:59:00 AM Version By: Levora Dredge Entered By: Worthy Keeler on 08/27/2021 13:15:21

## 2021-09-04 ENCOUNTER — Encounter (INDEPENDENT_AMBULATORY_CARE_PROVIDER_SITE_OTHER): Payer: Self-pay | Admitting: Nurse Practitioner

## 2021-09-04 NOTE — Progress Notes (Signed)
Subjective:    Patient ID: [name], female    DOB: [birth year], 66 y.o.   MRN: [id number] Chief Complaint  Patient presents with   Follow-up    ultrasound    The patient returns to the office for followup and review of the noninvasive studies. There have been no interval changes in lower extremity symptoms. No interval shortening of the patient's claudication distance or development of rest pain symptoms. No new ulcers or wounds have occurred since the last visit.  There have been no significant changes to the patient's overall health care.  The patient denies amaurosis fugax or recent TIA symptoms. There are no recent neurological changes noted. The patient denies history of DVT, PE or superficial thrombophlebitis. The patient denies recent episodes of angina or shortness of breath.   Patient was unable to tolerate the blood pressure cuffs so ABIs were unable to be obtained but the TBI 0.56 bilaterally.  The right lower extremity has triphasic/biphasic waveforms throughout the lower extremity.  The left also has biphasic/triphasic waveforms with monophasic waveforms at the distal tibial artery.   Review of Systems  Neurological:  Positive for weakness.  All other systems reviewed and are negative.     Objective:   Physical Exam Vitals reviewed.  HENT:     Head: Normocephalic.  Cardiovascular:     Rate and Rhythm: Normal rate.     Pulses:          Dorsalis pedis pulses are detected w/ Doppler on the right side and detected w/ Doppler on the left side.       Posterior tibial pulses are detected w/ Doppler on the right side and detected w/ Doppler on the left side.  Pulmonary:     Effort: Pulmonary effort is normal.  Skin:    General: Skin is warm and dry.  Neurological:     Mental Status: She is alert and oriented to person, place, and time.  Psychiatric:        Mood and Affect: Mood normal.        Behavior: Behavior normal.        Thought Content: Thought content  normal.        Judgment: Judgment normal.    BP 109/69 (BP Location: Left Arm)    Pulse 84    Resp 16    Ht $R'5\' 5"'Bl$  (1.651 m)    Wt 212 lb (96.2 kg)    BMI 35.28 kg/m   Past Medical History:  Diagnosis Date   Diabetes mellitus without complication ([place]) 3295   Hypertension    Hypothyroidism    Kidney stone 11/2014   history of/STAGE 4 KIDNEY DISEASE PER DR Juleen China   Lymphedema    Multiple open wounds of lower leg    PT BEING SEEN BY THE W[facility] CENTER FOR CHRONIC BLISTERS PER PT    Social History   Socioeconomic History   Marital status: Single    Spouse name: Not on file   Number of children: 0   Years of education: Not on file   Highest education level: Not on file  Occupational History   Not on file  Tobacco Use   Smoking status: Never   Smokeless tobacco: Never  Vaping Use   Vaping Use: Never used  Substance and Sexual Activity   Alcohol use: No   Drug use: No   Sexual activity: Never  Other Topics Concern   Not on file  Social History Narrative   Pt lives  alone   Social Determinants of Health   Financial Resource Strain: Low Risk    Difficulty of Paying Living Expenses: Not very hard  Food Insecurity: No Food Insecurity   Worried About Charity fundraiser in the Last Year: Never true   Ran Out of Food in the Last Year: Never true  Transportation Needs: No Transportation Needs   Lack of Transportation (Medical): No   Lack of Transportation (Non-Medical): No  Physical Activity: Inactive   Days of Exercise per Week: 0 days   Minutes of Exercise per Session: 0 min  Stress: No Stress Concern Present   Feeling of Stress : Only a little  Social Connections: Socially Isolated   Frequency of Communication with Friends and Family: More than three times a week   Frequency of Social Gatherings with Friends and Family: Three times a week   Attends Religious Services: Never   Active Member of Clubs or Organizations: No   Attends Archivist Meetings: Never    Marital Status: Never married  Human resources officer Violence: Not At Risk   Fear of Current or Ex-Partner: No   Emotionally Abused: No   Physically Abused: No   Sexually Abused: No    Past Surgical History:  Procedure Laterality Date   AMPUTATION Right 05/03/2021   Procedure: AMPUTATION RAY;  Surgeon: Caroline More, DPM;  Location: [facility];  Service: Podiatry;  Laterality: Right;  RIght Big Toe and Right Fifth Toe   IRRIGATION AND DEBRIDEMENT FOOT Bilateral 05/03/2021   Procedure: IRRIGATION AND DEBRIDEMENT FEET;  Surgeon: Caroline More, DPM;  Location: [facility];  Service: Podiatry;  Laterality: Bilateral;   LOWER EXTREMITY ANGIOGRAPHY Right 04/27/2021   Procedure: Lower Extremity Angiography;  Surgeon: Katha Cabal, MD;  Location: [facility];  Service: Cardiovascular;  Laterality: Right;   LOWER EXTREMITY ANGIOGRAPHY Left 04/30/2021   Procedure: Lower Extremity Angiography;  Surgeon: Algernon Huxley, MD;  Location: [facility];  Service: Cardiovascular;  Laterality: Left;   TOTAL HIP ARTHROPLASTY Right 05/28/2015   Procedure: TOTAL HIP ARTHROPLASTY ANTERIOR APPROACH;  Surgeon: Hessie Knows, MD;  Location: [facility];  Service: Orthopedics;  Laterality: Right;    Family History  Problem Relation Age of Onset   Cancer Mother    Diabetes Father     No Known Allergies  CBC Latest Ref Rng & Units 05/18/2021 05/15/2021 05/13/2021  WBC 4.0 - 10.5 K/uL 5.2 6.2 5.1  Hemoglobin 12.0 - 15.0 g/dL 10.3(L) 10.4(L) 10.4(L)  Hematocrit 36.0 - 46.0 % 32.7(L) 32.9(L) 33.8(L)  Platelets 150 - 400 K/uL 287 275 282      CMP     Component Value Date/Time   NA 137 05/18/2021 0301   NA 137 10/22/2020 1450   NA 139 02/20/2012 0737   K 3.0 (L) 05/18/2021 0301   K 4.7 02/20/2012 0737   CL 97 (L) 05/18/2021 0301   CL 103 02/20/2012 0737   CO2 31 05/18/2021 0301   CO2 27 02/20/2012 0737   GLUCOSE 137 (H) 05/18/2021 0301   GLUCOSE 123 (H) 02/20/2012 0737   BUN 12 05/18/2021 0301    BUN 27 10/22/2020 1450   BUN 26 (H) 02/20/2012 0737   CREATININE 1.21 (H) 05/18/2021 0301   CREATININE 1.31 (H) 02/20/2012 0737   CALCIUM 8.1 (L) 05/18/2021 0301   CALCIUM 9.3 02/20/2012 0737   PROT 8.6 (H) 04/17/2021 0019   PROT 7.8 11/05/2019 1544   PROT 7.8 02/20/2012 0737   ALBUMIN 3.2 (L)  04/17/2021 0019   ALBUMIN 4.0 10/22/2020 1450   ALBUMIN 3.5 02/20/2012 0737   AST 25 04/17/2021 0019   AST 23 02/20/2012 0737   ALT 15 04/17/2021 0019   ALT 22 02/20/2012 0737   ALKPHOS 74 04/17/2021 0019   ALKPHOS 82 02/20/2012 0737   BILITOT 0.9 04/17/2021 0019   BILITOT 0.7 11/05/2019 1544   BILITOT 0.8 02/20/2012 0737   GFRNONAA 49 (L) 05/18/2021 0301   GFRNONAA 45 (L) 02/20/2012 0737   GFRAA 39 (L) 04/30/2020 1408   GFRAA 52 (L) 02/20/2012 0737     VAS [name] ABI WITH/WO TBI  Result Date: 08/26/2021  LOWER EXTREMITY DOPPLER STUDY Patient Name:  SHANELE NISSAN  Date of Exam:   08/24/2021 Medical Rec #: [id number]          Accession #:    [id number] Date of Birth: [birth year]           Patient Gender: F Patient Age:   47 years Exam Location:  [facility] Procedure:      VAS [name] ABI WITH/WO TBI Referring Phys: Hortencia Pilar --------------------------------------------------------------------------------  Indications: Ulceration.  Performing Technologist: Almira Coaster RVS  Examination Guidelines: A complete evaluation includes at minimum, Doppler waveform signals and systolic blood pressure reading at the level of bilateral brachial, anterior tibial, and posterior tibial arteries, when vessel segments are accessible. Bilateral testing is considered an integral part of a complete examination. Photoelectric Plethysmograph (PPG) waveforms and toe systolic pressure readings are included as required and additional duplex testing as needed. Limited examinations for reoccurring indications may be performed as noted.  ABI Findings: +---------+------------------+-----+--------+--------+   Right     Rt Pressure (mmHg) Index Waveform Comment   +---------+------------------+-----+--------+--------+  Brachial  151                                         +---------+------------------+-----+--------+--------+  Great Toe 84                 0.56  Abnormal           +---------+------------------+-----+--------+--------+ +---------+------------------+-----+--------+-------+  Left      Lt Pressure (mmHg) Index Waveform Comment  +---------+------------------+-----+--------+-------+  Brachial  150                                        +---------+------------------+-----+--------+-------+  Great Toe 85                 0.56  Abnormal          +---------+------------------+-----+--------+-------+ +-------+------------+-----------+------------+------------+  ABI/TBI Today's ABI  Today's TBI Previous ABI Previous TBI  +-------+------------+-----------+------------+------------+  Right   Unobtainable .56                                    +-------+------------+-----------+------------+------------+  Left    Unobtainable .56                                    +-------+------------+-----------+------------+------------+  Summary: Right: The right toe-brachial index is abnormal. Patient could not withstand Pressure Cuff. Left: The left toe-brachial index is abnormal. Patient could not withstand Pressure Cuff.  *See table(s) above for measurements  and observations.  Electronically signed by Hortencia Pilar MD on 08/26/2021 at 4:23:28 PM.    Final        Assessment & Plan:   1. PAD (peripheral artery disease) (HCC)  Recommend:  The patient has evidence of atherosclerosis of the lower extremities with claudication.  The patient does not voice lifestyle limiting changes at this point in time.  Noninvasive studies do not suggest clinically significant change.  No invasive studies, angiography or surgery at this time The patient should continue walking and begin a more formal exercise program.  The patient should  continue antiplatelet therapy and aggressive treatment of the lipid abnormalities  No changes in the patient's medications at this time  The patient should continue wearing graduated compression socks 10-15 mmHg strength to control the mild edema.    2. Type 2 diabetes mellitus with hyperlipidemia ([place]) Continue hypoglycemic medications as already ordered, these medications have been reviewed and there are no changes at this time.  Hgb A1C to be monitored as already arranged by primary service   3. Essential hypertension Continue antihypertensive medications as already ordered, these medications have been reviewed and there are no changes at this time.    Current Outpatient Medications on File Prior to Visit  Medication Sig Dispense Refill   acetaminophen (TYLENOL) 325 MG tablet Take 2 tablets (650 mg total) by mouth every 6 (six) hours as needed for mild pain (or Fever >/= 101).     amLODipine (NORVASC) 10 MG tablet Take 1 tablet (10 mg total) by mouth daily. 30 tablet 0   atorvastatin (LIPITOR) 20 MG tablet Take 1 tablet (20 mg total) by mouth daily. 90 tablet 1   blood glucose meter kit and supplies 1 each by Other route daily. ONE TOUCH ULTRA METER. Use as directed. E11.29 1 each 0   cloNIDine (CATAPRES) 0.3 MG tablet TAKE ONE TABLET BY MOUTH 3 TIMES DAILY. 270 tablet 1   clopidogrel (PLAVIX) 75 MG tablet Take 1 tablet (75 mg total) by mouth daily with breakfast. 30 tablet 0   furosemide (LASIX) 20 MG tablet Take 1 tablet (20 mg total) by mouth 2 (two) times daily. 60 tablet 0   glucose blood test strip 1 each by Other route daily. ONE TOUCH ULTRA TEST STRIPS E11.29 100 strip 3   hydrOXYzine (ATARAX/VISTARIL) 10 MG tablet Take 1 tablet (10 mg total) by mouth 3 (three) times daily as needed. 30 tablet 0   insulin aspart (NOVOLOG) 100 UNIT/ML injection Inject 3 Units into the skin 3 (three) times daily with meals. 10 mL 0   insulin glargine-yfgn (SEMGLEE) 100 UNIT/ML injection Inject  0.08 mLs (8 Units total) into the skin at bedtime. 10 mL 0   irbesartan (AVAPRO) 150 MG tablet Take 1 tablet (150 mg total) by mouth daily. 30 tablet 0   Lancets (ONETOUCH ULTRASOFT) lancets 1 each by Other route daily. Use as instructed E11.29 100 each 3   levothyroxine (SYNTHROID) 112 MCG tablet Take 1 tablet (112 mcg total) by mouth daily before breakfast. 90 tablet 0   magnesium oxide (MAG-OX) 400 (240 Mg) MG tablet Take 1 tablet (400 mg total) by mouth daily. 30 tablet 0   meclizine (ANTIVERT) 25 MG tablet Take 1 tablet (25 mg total) by mouth 3 (three) times daily as needed for dizziness. 30 tablet 0   metoprolol succinate (TOPROL-XL) 25 MG 24 hr tablet Take 0.5 tablets (12.5 mg total) by mouth daily with breakfast. 15 tablet 0   oxyCODONE (OXY  IR/ROXICODONE) 5 MG immediate release tablet Take 1 tablet (5 mg total) by mouth every 6 (six) hours as needed for severe pain. 8 tablet 0   enoxaparin (LOVENOX) 60 MG/0.6ML injection Inject 0.6 mLs (60 mg total) into the skin daily. 18 mL 0   potassium chloride SA (KLOR-CON) 20 MEQ tablet Take 2 tablets (40 mEq total) by mouth daily for 3 days. 6 tablet 0   No current facility-administered medications on file prior to visit.    There are no Patient Instructions on file for this visit. No follow-ups on file.   Kris Hartmann, NP

## 2021-09-08 DIAGNOSIS — F4321 Adjustment disorder with depressed mood: Secondary | ICD-10-CM | POA: Diagnosis not present

## 2021-09-08 DIAGNOSIS — R69 Illness, unspecified: Secondary | ICD-10-CM | POA: Diagnosis not present

## 2021-09-08 DIAGNOSIS — F411 Generalized anxiety disorder: Secondary | ICD-10-CM | POA: Diagnosis not present

## 2021-09-10 DIAGNOSIS — I87331 Chronic venous hypertension (idiopathic) with ulcer and inflammation of right lower extremity: Secondary | ICD-10-CM | POA: Diagnosis not present

## 2021-09-13 ENCOUNTER — Telehealth: Payer: Self-pay

## 2021-09-13 NOTE — Telephone Encounter (Signed)
Mailbox is full could not lvm ?

## 2021-09-13 NOTE — Telephone Encounter (Signed)
Insurance is requiring patient to have a visit with Dr Ronnald Ramp for a follow up for thyroid and cholesterol in person. Please schedule patient a fasting appt with Dr Ronnald Ramp.  ? ?Once scheduled, please send back to me with date and time so I can send to insurance. ? ?Thank you! ?

## 2021-09-17 ENCOUNTER — Encounter: Payer: Medicare HMO | Admitting: Physician Assistant

## 2021-09-17 ENCOUNTER — Other Ambulatory Visit: Payer: Self-pay

## 2021-09-17 DIAGNOSIS — Z7901 Long term (current) use of anticoagulants: Secondary | ICD-10-CM | POA: Diagnosis not present

## 2021-09-17 DIAGNOSIS — Z743 Need for continuous supervision: Secondary | ICD-10-CM | POA: Diagnosis not present

## 2021-09-17 DIAGNOSIS — L89896 Pressure-induced deep tissue damage of other site: Secondary | ICD-10-CM | POA: Diagnosis not present

## 2021-09-17 DIAGNOSIS — I890 Lymphedema, not elsewhere classified: Secondary | ICD-10-CM | POA: Diagnosis not present

## 2021-09-17 DIAGNOSIS — M1990 Unspecified osteoarthritis, unspecified site: Secondary | ICD-10-CM | POA: Diagnosis not present

## 2021-09-17 DIAGNOSIS — L97222 Non-pressure chronic ulcer of left calf with fat layer exposed: Secondary | ICD-10-CM | POA: Diagnosis not present

## 2021-09-17 DIAGNOSIS — E1151 Type 2 diabetes mellitus with diabetic peripheral angiopathy without gangrene: Secondary | ICD-10-CM | POA: Diagnosis not present

## 2021-09-17 DIAGNOSIS — E11622 Type 2 diabetes mellitus with other skin ulcer: Secondary | ICD-10-CM | POA: Diagnosis not present

## 2021-09-17 DIAGNOSIS — I959 Hypotension, unspecified: Secondary | ICD-10-CM | POA: Diagnosis not present

## 2021-09-17 DIAGNOSIS — L89322 Pressure ulcer of left buttock, stage 2: Secondary | ICD-10-CM | POA: Diagnosis not present

## 2021-09-17 DIAGNOSIS — I10 Essential (primary) hypertension: Secondary | ICD-10-CM | POA: Diagnosis not present

## 2021-09-17 DIAGNOSIS — R29898 Other symptoms and signs involving the musculoskeletal system: Secondary | ICD-10-CM | POA: Diagnosis not present

## 2021-09-17 DIAGNOSIS — Z6841 Body Mass Index (BMI) 40.0 and over, adult: Secondary | ICD-10-CM | POA: Diagnosis not present

## 2021-09-17 DIAGNOSIS — E6609 Other obesity due to excess calories: Secondary | ICD-10-CM | POA: Diagnosis not present

## 2021-09-17 DIAGNOSIS — E038 Other specified hypothyroidism: Secondary | ICD-10-CM | POA: Diagnosis not present

## 2021-09-17 DIAGNOSIS — L97422 Non-pressure chronic ulcer of left heel and midfoot with fat layer exposed: Secondary | ICD-10-CM | POA: Diagnosis not present

## 2021-09-17 DIAGNOSIS — E1122 Type 2 diabetes mellitus with diabetic chronic kidney disease: Secondary | ICD-10-CM | POA: Diagnosis not present

## 2021-09-17 DIAGNOSIS — L98492 Non-pressure chronic ulcer of skin of other sites with fat layer exposed: Secondary | ICD-10-CM | POA: Diagnosis not present

## 2021-09-17 DIAGNOSIS — Z7902 Long term (current) use of antithrombotics/antiplatelets: Secondary | ICD-10-CM | POA: Diagnosis not present

## 2021-09-17 DIAGNOSIS — Z7401 Bed confinement status: Secondary | ICD-10-CM | POA: Diagnosis not present

## 2021-09-17 DIAGNOSIS — L89893 Pressure ulcer of other site, stage 3: Secondary | ICD-10-CM | POA: Diagnosis not present

## 2021-09-17 DIAGNOSIS — I120 Hypertensive chronic kidney disease with stage 5 chronic kidney disease or end stage renal disease: Secondary | ICD-10-CM | POA: Diagnosis not present

## 2021-09-17 DIAGNOSIS — R5381 Other malaise: Secondary | ICD-10-CM | POA: Diagnosis not present

## 2021-09-17 DIAGNOSIS — E1140 Type 2 diabetes mellitus with diabetic neuropathy, unspecified: Secondary | ICD-10-CM | POA: Diagnosis not present

## 2021-09-17 DIAGNOSIS — N186 End stage renal disease: Secondary | ICD-10-CM | POA: Diagnosis not present

## 2021-09-17 NOTE — Progress Notes (Addendum)
MAISLEY, HAINSWORTH ([id number]) ?Visit Report for 09/17/2021 ?Arrival Information Details ?Patient Name: [name] ?Date of Service: 09/17/2021 10:30 AM ?Medical Record Number: [id number] ?Patient Account Number: [id number] ?Date of Birth/Sex: [birth year] (66 y.o. F) ?Treating RN: Levora Dredge ?Primary Care Carmie Lanpher: [name] Other Clinician: ?Referring Angelena Sand: [name] ?Treating Nieshia Larmon/Extender: Jeri Cos ?Weeks in Treatment: 15 ?Visit Information History Since Last Visit ?Added or deleted any medications: No ?Patient Arrived: Stretcher ?Any new allergies or adverse reactions: No ?Arrival Time: 10:16 ?Had a fall or experienced change in No ?Accompanied By: self ?activities of daily living that may affect ?Transfer Assistance: Stretcher ?risk of falls: ?Patient Identification Verified: Yes ?Hospitalized since last visit: No ?Secondary Verification Process Completed: Yes ?Has Dressing in Place as Prescribed: Yes ?Patient Requires Transmission-Based No ?Pain Present Now: Yes ?Precautions: ?Patient Has Alerts: Yes ?Patient Alerts: Patient on Blood ?Thinner ?TYPE II Diabetic ?Plavix and Lovenox ?[place] ?Rehab ?Electronic Signature(s) ?Signed: 09/17/2021 4:31:50 PM By: Levora Dredge ?Entered By: Levora Dredge on 09/17/2021 10:17:08 ?ADELIN, VENTRELLA ([id number]) ?-------------------------------------------------------------------------------- ?Clinic Level of Care Assessment Details ?Patient Name: [name] ?Date of Service: 09/17/2021 10:30 AM ?Medical Record Number: [id number] ?Patient Account Number: [id number] ?Date of Birth/Sex: [birth year] (66 y.o. F) ?Treating RN: Levora Dredge ?Primary Care Jordi Kamm: [name] Other Clinician: ?Referring Farrie Sann: [name] ?Treating Tyson Parkison/Extender: Jeri Cos ?Weeks in Treatment: 15 ?Clinic Level of Care Assessment Items ?TOOL 4 Quantity Score ?[]  - Use when only an EandM is performed on FOLLOW-UP visit 0 ?ASSESSMENTS -  Nursing Assessment / Reassessment ?[]  - Reassessment of Co-morbidities (includes updates in patient status) 0 ?[]  - 0 ?Reassessment of Adherence to Treatment Plan ?ASSESSMENTS - Wound and Skin Assessment / Reassessment ?[]  - Simple Wound Assessment / Reassessment - one wound 0 ?X- 5 5 ?Complex Wound Assessment / Reassessment - multiple wounds ?[]  - 0 ?Dermatologic / Skin Assessment (not related to wound area) ?ASSESSMENTS - Focused Assessment ?[]  - Circumferential Edema Measurements - multi extremities 0 ?[]  - 0 ?Nutritional Assessment / Counseling / Intervention ?[]  - 0 ?Lower Extremity Assessment (monofilament, tuning fork, pulses) ?[]  - 0 ?Peripheral Arterial Disease Assessment (using hand held doppler) ?ASSESSMENTS - Ostomy and/or Continence Assessment and Care ?[]  - Incontinence Assessment and Management 0 ?[]  - 0 ?Ostomy Care Assessment and Management (repouching, etc.) ?PROCESS - Coordination of Care ?X - Simple Patient / Family Education for ongoing care 1 15 ?[]  - 0 ?Complex (extensive) Patient / Family Education for ongoing care ?[]  - 0 ?Staff obtains Consents, Records, Test Results / Process Orders ?[]  - 0 ?Staff telephones HHA, Nursing Homes / Clarify orders / etc ?[]  - 0 ?Routine Transfer to another Facility (non-emergent condition) ?[]  - 0 ?Routine Hospital Admission (non-emergent condition) ?[]  - 0 ?New Admissions / Biomedical engineer / Ordering NPWT, Apligraf, etc. ?[]  - 0 ?Emergency Hospital Admission (emergent condition) ?X- 1 10 ?Simple Discharge Coordination ?[]  - 0 ?Complex (extensive) Discharge Coordination ?PROCESS - Special Needs ?[]  - Pediatric / Minor Patient Management 0 ?[]  - 0 ?Isolation Patient Management ?[]  - 0 ?Hearing / Language / Visual special needs ?[]  - 0 ?Assessment of Community assistance (transportation, D/C planning, etc.) ?[]  - 0 ?Additional assistance / Altered mentation ?[]  - 0 ?Support Surface(s) Assessment (bed, cushion, seat, etc.) ?INTERVENTIONS - Wound  Cleansing / Measurement ?KJIRSTEN, BLOODGOOD ([id number]) ?[]  - 0 ?Simple Wound Cleansing - one wound ?X- 5 5 ?Complex Wound Cleansing - multiple wounds ?X- 1 5 ?Wound Imaging (photographs - any number of wounds) ?[]  -  0 ?Wound Tracing (instead of photographs) ?[]  - 0 ?Simple Wound Measurement - one wound ?X- 5 5 ?Complex Wound Measurement - multiple wounds ?INTERVENTIONS - Wound Dressings ?[]  - Small Wound Dressing one or multiple wounds 0 ?X- 5 15 ?Medium Wound Dressing one or multiple wounds ?[]  - 0 ?Large Wound Dressing one or multiple wounds ?[]  - 0 ?Application of Medications - topical ?[]  - 0 ?Application of Medications - injection ?INTERVENTIONS - Miscellaneous ?[]  - External ear exam 0 ?[]  - 0 ?Specimen Collection (cultures, biopsies, blood, body fluids, etc.) ?[]  - 0 ?Specimen(s) / Culture(s) sent or taken to Lab for analysis ?[]  - 0 ?Patient Transfer (multiple staff / Civil Service fast streamer / Similar devices) ?[]  - 0 ?Simple Staple / Suture removal (25 or less) ?[]  - 0 ?Complex Staple / Suture removal (26 or more) ?[]  - 0 ?Hypo / Hyperglycemic Management (close monitor of Blood Glucose) ?[]  - 0 ?Ankle / Brachial Index (ABI) - do not check if billed separately ?X- 1 5 ?Vital Signs ?Has the patient been seen at the hospital within the last three years: Yes ?Total Score: 185 ?Level Of Care: New/Established - Level ?5 ?Electronic Signature(s) ?Signed: 09/17/2021 4:31:50 PM By: Levora Dredge ?Entered By: Levora Dredge on 09/17/2021 11:45:52 ?KAYLOR, MAIERS ([id number]) ?-------------------------------------------------------------------------------- ?Complex / Palliative Patient Assessment Details ?Patient Name: [name] ?Date of Service: 09/17/2021 10:30 AM ?Medical Record Number: [id number] ?Patient Account Number: [id number] ?Date of Birth/Sex: Aug 22, 1955 (66 y.o. F) ?Treating RN: Donnamarie Poag ?Primary Care Africa Masaki: [name] Other Clinician: ?Referring Ercia Crisafulli: [name] ?Treating  Chimamanda Siegfried/Extender: Jeri Cos ?Weeks in Treatment: 15 ?Complex Wound Management Criteria ?Patient has remarkable or complex co-morbidities requiring medications or treatments that extend wound healing times. Examples: ?o? Diabetes mellitus with chronic renal failure or end stage renal disease requiring dialysis ?o? Advanced or poorly controlled rheumatoid arthritis ?o? Diabetes mellitus and end stage chronic obstructive pulmonary disease ?o? Active cancer with current chemo- or radiation therapy ?DM, lives in SNF, immobility, chronic pain ?Palliative Wound Management Criteria ?Care Approach ?Wound Care Plan: Complex Wound Management ?Electronic Signature(s) ?Signed: 09/20/2021 1:20:26 PM By: Donnamarie Poag ?Signed: 09/20/2021 3:51:56 PM By: Worthy Keeler PA-C ?Entered ByDonnamarie Poag on 09/20/2021 13:20:25 ?RETA, NORGREN ([id number]) ?-------------------------------------------------------------------------------- ?Encounter Discharge Information Details ?Patient Name: [name] ?Date of Service: 09/17/2021 10:30 AM ?Medical Record Number: [id number] ?Patient Account Number: [id number] ?Date of Birth/Sex: Jan 30, 1956 (66 y.o. F) ?Treating RN: Levora Dredge ?Primary Care Teeghan Hammer: [name] Other Clinician: ?Referring Marceil Welp: [name] ?Treating Makayela Secrest/Extender: Jeri Cos ?Weeks in Treatment: 15 ?Encounter Discharge Information Items ?Discharge Condition: Stable ?Ambulatory Status: Stretcher ?Discharge Destination: [place] ?Orders Sent: Yes ?Transportation: Other ?Accompanied By: self ?Schedule Follow-up Appointment: Yes ?Clinical Summary of Care: ?Electronic Signature(s) ?Signed: 09/17/2021 4:31:50 PM By: Levora Dredge ?Entered By: Levora Dredge on 09/17/2021 11:47:12 ?CAITLEN, WORTH ([id number]) ?-------------------------------------------------------------------------------- ?Lower Extremity Assessment Details ?Patient Name: [name] ?Date of Service:  09/17/2021 10:30 AM ?Medical Record Number: [id number] ?Patient Account Number: [id number] ?Date of Birth/Sex: February 24, 1956 (66 y.o. F) ?Treating RN: Levora Dredge ?Primary Care Sia Gabrielsen: [name] Other Clinician: ?Referring Provi

## 2021-09-17 NOTE — Progress Notes (Addendum)
DINITA, MIGLIACCIO ([id number]) ?Visit Report for 09/17/2021 ?Chief Complaint Document Details ?Patient Name: [name] ?Date of Service: 09/17/2021 10:30 AM ?Medical Record Number: [id number] ?Patient Account Number: [id number] ?Date of Birth/Sex: 1956/03/13 (66 y.o. 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 ?Electronic Signature(s) ?Signed: 09/17/2021 10:41:49 AM By: Worthy Keeler PA-C ?Entered By: Worthy Keeler on 09/17/2021 10:41:49 ?ELIETTE, DRUMWRIGHT ([id number]) ?-------------------------------------------------------------------------------- ?HPI Details ?Patient Name: [name] ?Date of Service: 09/17/2021 10:30 AM ?Medical Record Number: [id number] ?Patient Account Number: [id number] ?Date of Birth/Sex: Nov 23, 1955 (66 y.o. 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 No PVD. ?h/o chronic, recurrent BLE calf ulcers. ?Treated with 4 layer compression. Infrequently uses lymphedema pump. ?Bilateral lower extremity ulcerations healed as of June 2016. ?Fitted for custom compression stockings but did not receive them. Patient could not travel for lymphedema consult. ?She developed recurrent bilateral lower extremity ulcerations in August 2016. She is able to heal these quite quickly with 4 layer elastic ?compression bandages. However, she is not very compliant using her juxtalite compression garments and tends to develop recurrent ulcerations ?while using  juxtalite. ?Unna boots were applied this past week. She tolerated this well but performs minimal ambulation. She is scheduled to undergo total hip ?replacement tomorrow with Dr. Rudene Christians. She is unsure if he is aware of her ulcerations. ?She is otherwise without complaints. No significant pain. No fever or chills. Moderate clear drainage. Not on any antibiotics. ?06/04/2015 -- she did have her hip surgery and has now been in rehabilitation and they're controlling her fluid intake and diuretics to the extent ?that she has lost 13 pounds of weight and her lower extremity circumference is decreased by 7 cm. ?06/18/2015 -- she is still in the rehabilitation facility and continues to be looked after well and has lost a total of 15 pounds and her diabetes is ?under much better control. ?Readmission: ?06/03/2021 upon evaluation today patient actually appears to be doing somewhat poorly in regard to a wound on the right posterior lower leg. ?Patient does have a history of previously having been seen here in the clinic but this predates my time here back in 2017. Subsequently ?unfortunately right now she is having a significant issue with bleeding in the posterior of her right calf this appears to be a deep tissue injury as ?developed into an ulceration. ?She does have a history significant for lymphedema, diabetes mellitus type 2, diabetic neuropathy, hypertension, hypothyroidism, and obesity. ?She is currently in a skilled nursing facility. ?The dressing that she had in place during evaluation today was Xeroform followed by significant amount of gauze and this was severely stuck. ?Upon removal the patient was having significant bleeding she also had significant work done in the hospital in regard to her blood flow and Dr. Lucky Cowboy does look like he has her on 2 blood thinners Plavix and Lovenox. This is probably part of the reason why she is bleeding so significantly. ?With that being said I do believe that based on what we are  seeing currently this is probably can to be something that were getting have to try to ?get stopped  with more than just pressure to the region currently. I think surgery foam is probably can to be necessary. If we can get the stop of ?the Surgifoam she is probably can have to go to the hospital to try to get this bleeding stopped. ?12/30; this is a patient who is currently in a skilled facility. She has wounds on her bilateral calfs likely secondary to severe chronic venous ?insufficiency and lymphedema the care of these wounds is complicated by the fact that she is on Lovenox and Plavix and bleeds very frequently ?with even minimal trauma to these wounds. It would appear that she had a Vaseline gauze on the surface of these in the process of undressing ?them she bled freely from especially the right but also the left. I had to go in and use silver nitrate. ?The skin on her legs is significant for chronic venous stasis and lymphedema. Very dry flaking skin ?07/16/2021; I actually was a patent person who saw this woman 3 weeks ago. She is a nursing facility has chronic venous wounds and bilateral ?lower extremity wounds. We use Xeroform under compression. The time she was on Lovenox and Plavix I thought I had an excellent reason for ?these wounds to bleed so freely. Since then the Lovenox has been stopped and should be well out of her system but she is still on Plavix. She still ?has very friable bleeding wounds on the left lateral left Posterior and right Posterior lower extremity.These believe very freely when we take off ?the dressing ?Also she has a left heel wound which when we saw her last time was a surgical wound however I gather we have been asked to follow this as well. ?I am not exactly sure at this time what her surgery was but she comes in with a wound VAC on this and they are apparently changing it at the ?nursing home. ?08/27/2021 upon evaluation patient appears to be doing better in regard to her wound  on the legs as well as the heel. Fortunately at this point I do ?not see any need for a wound VAC which is good news overall I think that the Xeroform is probably a good option that is what is being utilized ?here as well. Fortunately I do not see any signs of active infection locally nor systemically which is great news and overall I think that the patient ?is making excellent progress. The area on her right gluteal area actually appears to be doing quite well and I am pleased with that. I do believe ?the silver alginate dressing here could be of benefit for her. ?09/17/2021 upon evaluation patient appears to be doing actually better in regard to her wounds on the legs. Fortunately she does not seem to be ?showing any signs of worsening which is great news and overall very pleased with where we stand. I do believe that she is tolerating the dressing ?changes without complication which is great news. ?Electronic Signature(s) ?Signed: 09/17/2021 5:16:22 PM By: Worthy Keeler PA-C ?SIARA, GORDER ([id number]) ?Entered By: Worthy Keeler on 09/17/2021 17:16:22 ?INDRA, WOLTERS ([id number]) ?-------------------------------------------------------------------------------- ?Physical Exam Details ?Patient Name: [name] ?Date of Service: 09/17/2021 10:30 AM ?Medical Record Number: [id number] ?Patient Account Number: [id number] ?Date of Birth/Sex: 02/22/1956 (66 y.o. F) ?Treating RN: Levora Dredge ?Primary Care Provider: [name] Other Clinician: ?Referring Provider: [name] ?Treating Provider/Extender: Jeri Cos ?Weeks in Treatment: 15 ?Constitutional ?Well-nourished and well-hydrated in no acute distress. ?Respiratory ?normal breathing without difficulty. ?Psychiatric ?this patient  is able to make decisions and demonstrates good insight into disease process. Alert and Oriented x 3. pleasant and cooperative. ?Notes ?Upon inspection patient's wounds are still extremely friable. I still think for  that reason that the Xeroform gauze is probably our best option here. ?She is in agreement with this plan. Fortunately I do not see any evidence of infection or worsening which is great news as well. ?Electronic S

## 2021-09-22 DIAGNOSIS — F4321 Adjustment disorder with depressed mood: Secondary | ICD-10-CM | POA: Diagnosis not present

## 2021-09-22 DIAGNOSIS — R69 Illness, unspecified: Secondary | ICD-10-CM | POA: Diagnosis not present

## 2021-09-22 DIAGNOSIS — F411 Generalized anxiety disorder: Secondary | ICD-10-CM | POA: Diagnosis not present

## 2021-10-01 DIAGNOSIS — I10 Essential (primary) hypertension: Secondary | ICD-10-CM | POA: Diagnosis not present

## 2021-10-08 ENCOUNTER — Encounter: Payer: Medicare HMO | Attending: Physician Assistant | Admitting: Physician Assistant

## 2021-10-08 DIAGNOSIS — Z6841 Body Mass Index (BMI) 40.0 and over, adult: Secondary | ICD-10-CM | POA: Insufficient documentation

## 2021-10-08 DIAGNOSIS — I890 Lymphedema, not elsewhere classified: Secondary | ICD-10-CM | POA: Diagnosis not present

## 2021-10-08 DIAGNOSIS — E11622 Type 2 diabetes mellitus with other skin ulcer: Secondary | ICD-10-CM | POA: Insufficient documentation

## 2021-10-08 DIAGNOSIS — Z743 Need for continuous supervision: Secondary | ICD-10-CM | POA: Diagnosis not present

## 2021-10-08 DIAGNOSIS — L89322 Pressure ulcer of left buttock, stage 2: Secondary | ICD-10-CM | POA: Diagnosis not present

## 2021-10-08 DIAGNOSIS — E6609 Other obesity due to excess calories: Secondary | ICD-10-CM | POA: Diagnosis not present

## 2021-10-08 DIAGNOSIS — I10 Essential (primary) hypertension: Secondary | ICD-10-CM | POA: Diagnosis not present

## 2021-10-08 DIAGNOSIS — E1140 Type 2 diabetes mellitus with diabetic neuropathy, unspecified: Secondary | ICD-10-CM | POA: Insufficient documentation

## 2021-10-08 DIAGNOSIS — E038 Other specified hypothyroidism: Secondary | ICD-10-CM | POA: Diagnosis not present

## 2021-10-08 DIAGNOSIS — I959 Hypotension, unspecified: Secondary | ICD-10-CM | POA: Diagnosis not present

## 2021-10-08 DIAGNOSIS — L89896 Pressure-induced deep tissue damage of other site: Secondary | ICD-10-CM | POA: Diagnosis not present

## 2021-10-08 DIAGNOSIS — L89893 Pressure ulcer of other site, stage 3: Secondary | ICD-10-CM | POA: Diagnosis not present

## 2021-10-08 DIAGNOSIS — Z7901 Long term (current) use of anticoagulants: Secondary | ICD-10-CM | POA: Insufficient documentation

## 2021-10-08 DIAGNOSIS — L97428 Non-pressure chronic ulcer of left heel and midfoot with other specified severity: Secondary | ICD-10-CM | POA: Diagnosis not present

## 2021-10-08 DIAGNOSIS — Z7902 Long term (current) use of antithrombotics/antiplatelets: Secondary | ICD-10-CM | POA: Insufficient documentation

## 2021-10-08 DIAGNOSIS — L97228 Non-pressure chronic ulcer of left calf with other specified severity: Secondary | ICD-10-CM | POA: Diagnosis not present

## 2021-10-08 DIAGNOSIS — Z7401 Bed confinement status: Secondary | ICD-10-CM | POA: Diagnosis not present

## 2021-10-08 NOTE — Progress Notes (Addendum)
[name], [name] ([id number]) ?Visit Report for 10/08/2021 ?Chief Complaint Document Details ?Patient Name: [name] ?Date of Service: 10/08/2021 10:30 AM ?Medical Record Number: [id number] ?Patient Account Number: [id number] ?Date of Birth/Sex: [birth year] (66 y.o. 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 ?Electronic Signature(s) ?Signed: 10/08/2021 11:05:39 AM By: Worthy Keeler PA-C ?Entered By: Worthy Keeler on 10/08/2021 11:05:39 ?[name], [name] ([id number]) ?-------------------------------------------------------------------------------- ?HPI Details ?Patient Name: [name] ?Date of Service: 10/08/2021 10:30 AM ?Medical Record Number: [id number] ?Patient Account Number: [id number] ?Date of Birth/Sex: [birth year] (66 y.o. F) ?Treating RN: Cornell Barman ?Primary Care Provider: [name] Other Clinician: ?Referring Provider: [name] ?Treating Provider/Extender: Jeri Cos ?Weeks in Treatment: 18 ?History of Present Illness ?HPI Description: 66yo w/ h/o BLE phlebolymphedema, type 2 DM (unknown hemoglobin A1c), and obesity. No PVD. ?h/o chronic, recurrent BLE calf ulcers. ?Treated with 4 layer compression. Infrequently uses lymphedema pump. ?Bilateral lower extremity ulcerations healed as of June 2016. ?Fitted for custom compression stockings but did not receive them. Patient could not travel for lymphedema consult. ?She developed recurrent bilateral lower extremity ulcerations in August 2016. She is able to heal these quite quickly with 4 layer elastic ?compression bandages. However, she is not very compliant using her juxtalite compression garments and tends to develop recurrent ulcerations ?while using juxtalite. ?Unna  boots were applied this past week. She tolerated this well but performs minimal ambulation. She is scheduled to undergo total hip ?replacement tomorrow with Dr. Rudene Christians. She is unsure if he is aware of her ulcerations. ?She is otherwise without complaints. No significant pain. No fever or chills. Moderate clear drainage. Not on any antibiotics. ?06/04/2015 -- she did have her hip surgery and has now been in rehabilitation and they're controlling her fluid intake and diuretics to the extent ?that she has lost 13 pounds of weight and her lower extremity circumference is decreased by 7 cm. ?06/18/2015 -- she is still in the rehabilitation facility and continues to be looked after well and has lost a total of 15 pounds and her diabetes is ?under much better control. ?Readmission: ?06/03/2021 upon evaluation today patient actually appears to be doing somewhat poorly in regard to a wound on the right posterior lower leg. ?Patient does have a history of previously having been seen here in the clinic but this predates my time here back in 2017. Subsequently ?unfortunately right now she is having a significant issue with bleeding in the posterior of her right calf this appears to be a deep tissue injury as ?developed into an ulceration. ?She does have a history significant for lymphedema, diabetes mellitus type 2, diabetic neuropathy, hypertension, hypothyroidism, and obesity. ?She is currently in a skilled nursing facility. ?The dressing that she had in place during evaluation today was Xeroform followed by significant amount of gauze and this was severely stuck. ?Upon removal the patient was having significant bleeding she also had significant work done in the hospital in regard to her blood flow and Dr. Lucky Cowboy does look like he has her on 2 blood thinners Plavix and Lovenox. This is probably part of the reason why she is bleeding so significantly. ?With that being said I do believe that based on what we are seeing currently  this is probably can to be something that were getting have to try to ?get stopped  with more than just pressure to the region currently. I think surgery foam is probably can to be necessary. If we can get the stop of ?the Surgifoam she is probably can have to go to the hospital to try to get this bleeding stopped. ?12/30; this is a patient who is currently in a skilled facility. She has wounds on her bilateral calfs likely secondary to severe chronic venous ?insufficiency and lymphedema the care of these wounds is complicated by the fact that she is on Lovenox and Plavix and bleeds very frequently ?with even minimal trauma to these wounds. It would appear that she had a Vaseline gauze on the surface of these in the process of undressing ?them she bled freely from especially the right but also the left. I had to go in and use silver nitrate. ?The skin on her legs is significant for chronic venous stasis and lymphedema. Very dry flaking skin ?07/16/2021; I actually was a patent person who saw this woman 3 weeks ago. She is a nursing facility has chronic venous wounds and bilateral ?lower extremity wounds. We use Xeroform under compression. The time she was on Lovenox and Plavix I thought I had an excellent reason for ?these wounds to bleed so freely. Since then the Lovenox has been stopped and should be well out of her system but she is still on Plavix. She still ?has very friable bleeding wounds on the left lateral left Posterior and right Posterior lower extremity.These believe very freely when we take off ?the dressing ?Also she has a left heel wound which when we saw her last time was a surgical wound however I gather we have been asked to follow this as well. ?I am not exactly sure at this time what her surgery was but she comes in with a wound VAC on this and they are apparently changing it at the ?nursing home. ?08/27/2021 upon evaluation patient appears to be doing better in regard to her wound on the legs as  well as the heel. Fortunately at this point I do ?not see any need for a wound VAC which is good news overall I think that the Xeroform is probably a good option that is what is being utilized ?here as well. Fortunately I do not see any signs of active infection locally nor systemically which is great news and overall I think that the patient ?is making excellent progress. The area on her right gluteal area actually appears to be doing quite well and I am pleased with that. I do believe ?the silver alginate dressing here could be of benefit for her. ?09/17/2021 upon evaluation patient appears to be doing actually better in regard to her wounds on the legs. Fortunately she does not seem to be ?showing any signs of worsening which is great news and overall very pleased with where we stand. I do believe that she is tolerating the dressing ?changes without complication which is great news. ?10-08-2021 upon evaluation today patient unfortunately continues to have significant issues with her legs. Unfortunately the biggest issue that we ?see Currently is that she is still having a lot of bleeding from her legs that she is also not able to move well due to her knee pain her left knee is ?quite significant as far as the discomfort is concerned and the right knee is getting equally as bad. Fortunately I do not see any evidence of active ?[name], [name] ([id number]) ?infection locally or systemically which is great news. Nonetheless I do believe  that her heels are looking much better there is a tiny area open on ?the left heel the right is doing quite well. In regard to the gluteal/sacral region this appears to be healed. ?Electronic Signature(s) ?Signed: 10/08/2021 2:56:46 PM By: Worthy Keeler PA-C ?Entered By: Worthy Keeler on 10/08/2021 14:56:46 ?[name], [name] ([id number]) ?-------------------------------------------------------------------------------- ?Physical Exam Details ?Patient Name: [name] ?Date of Service: 10/08/2021 10:30 AM ?Medical Record Number: [id number] ?Patient Account Number: [id number] ?Date of Birth/Sex: Nov 04, 1955 (66 y.o. F) ?Treating RN: Cornell Barman ?Primary Care Provider: [name]

## 2021-10-08 NOTE — Progress Notes (Signed)
[name], [name] ([id number]) ?Visit Report for 10/08/2021 ?Arrival Information Details ?Patient Name: [name] ?Date of Service: 10/08/2021 10:30 AM ?Medical Record Number: [id number] ?Patient Account Number: [id number] ?Date of Birth/Sex: 03-22-1956 (66 y.o. F) ?Treating RN: Cornell Barman ?Primary Care Jc Veron: [name] Other Clinician: ?Referring Kandis Henry: [name] ?Treating Charmelle Soh/Extender: Jeri Cos ?Weeks in Treatment: 18 ?Visit Information History Since Last Visit ?Added or deleted any medications: No ?Patient Arrived: Stretcher ?Pain Present Now: No ?Arrival Time: 10:41 ?Accompanied By: EMS ?Transfer Assistance: Stretcher ?Patient Identification Verified: Yes ?Secondary Verification Process Completed: Yes ?Patient Requires Transmission-Based No ?Precautions: ?Patient Has Alerts: Yes ?Patient Alerts: Patient on Blood ?Thinner ?TYPE II Diabetic ?Plavix and Lovenox ?[place] [facility] ?Electronic Signature(s) ?Signed: 10/08/2021 4:28:26 PM By: Gretta Cool, BSN, RN, CWS, Kim RN, BSN ?Entered By: Gretta Cool, BSN, RN, CWS, Kim on 10/08/2021 10:42:12 ?[name], [name] ([id number]) ?-------------------------------------------------------------------------------- ?Clinic Level of Care Assessment Details ?Patient Name: [name] ?Date of Service: 10/08/2021 10:30 AM ?Medical Record Number: [id number] ?Patient Account Number: [id number] ?Date of Birth/Sex: 1955-10-11 (66 y.o. F) ?Treating RN: Cornell Barman ?Primary Care Sebasthian Stailey: [name] Other Clinician: ?Referring Sahalie Beth: [name] ?Treating Zykira Matlack/Extender: Jeri Cos ?Weeks in Treatment: 18 ?Clinic Level of Care Assessment Items ?TOOL 4 Quantity Score ?[]  - Use when only an EandM is performed on FOLLOW-UP visit 0 ?ASSESSMENTS - Nursing Assessment / Reassessment ?X - Reassessment of Co-morbidities (includes updates in patient status) 1 10 ?X- 1 5 ?Reassessment of Adherence to Treatment Plan ?ASSESSMENTS - Wound and Skin Assessment /  Reassessment ?[]  - Simple Wound Assessment / Reassessment - one wound 0 ?X- 5 5 ?Complex Wound Assessment / Reassessment - multiple wounds ?[]  - 0 ?Dermatologic / Skin Assessment (not related to wound area) ?ASSESSMENTS - Focused Assessment ?[]  - Circumferential Edema Measurements - multi extremities 0 ?[]  - 0 ?Nutritional Assessment / Counseling / Intervention ?[]  - 0 ?Lower Extremity Assessment (monofilament, tuning fork, pulses) ?[]  - 0 ?Peripheral Arterial Disease Assessment (using hand held doppler) ?ASSESSMENTS - Ostomy and/or Continence Assessment and Care ?[]  - Incontinence Assessment and Management 0 ?[]  - 0 ?Ostomy Care Assessment and Management (repouching, etc.) ?PROCESS - Coordination of Care ?X - Simple Patient / Family Education for ongoing care 1 15 ?[]  - 0 ?Complex (extensive) Patient / Family Education for ongoing care ?X- 1 10 ?Staff obtains Consents, Records, Test Results / Process Orders ?[]  - 0 ?Staff telephones HHA, Nursing Homes / Clarify orders / etc ?[]  - 0 ?Routine Transfer to another Facility (non-emergent condition) ?[]  - 0 ?Routine Hospital Admission (non-emergent condition) ?[]  - 0 ?New Admissions / Biomedical engineer / Ordering NPWT, Apligraf, etc. ?[]  - 0 ?Emergency Hospital Admission (emergent condition) ?X- 1 10 ?Simple Discharge Coordination ?[]  - 0 ?Complex (extensive) Discharge Coordination ?PROCESS - Special Needs ?[]  - Pediatric / Minor Patient Management 0 ?[]  - 0 ?Isolation Patient Management ?[]  - 0 ?Hearing / Language / Visual special needs ?[]  - 0 ?Assessment of Community assistance (transportation, D/C planning, etc.) ?[]  - 0 ?Additional assistance / Altered mentation ?[]  - 0 ?Support Surface(s) Assessment (bed, cushion, seat, etc.) ?INTERVENTIONS - Wound Cleansing / Measurement ?[name], [name] ([id number]) ?[]  - 0 ?Simple Wound Cleansing - one wound ?X- 5 5 ?Complex Wound Cleansing - multiple wounds ?X- 1 5 ?Wound Imaging (photographs - any number of  wounds) ?[]  - 0 ?Wound Tracing (instead of photographs) ?[]  - 0 ?Simple Wound Measurement - one wound ?X- 5 5 ?Complex Wound Measurement - multiple wounds ?INTERVENTIONS - Wound Dressings ?[]  -  Small Wound Dressing one or multiple wounds 0 ?[]  - 0 ?Medium Wound Dressing one or multiple wounds ?X- 2 20 ?Large Wound Dressing one or multiple wounds ?[]  - 0 ?Application of Medications - topical ?[]  - 0 ?Application of Medications - injection ?INTERVENTIONS - Miscellaneous ?[]  - External ear exam 0 ?[]  - 0 ?Specimen Collection (cultures, biopsies, blood, body fluids, etc.) ?[]  - 0 ?Specimen(s) / Culture(s) sent or taken to Lab for analysis ?[]  - 0 ?Patient Transfer (multiple staff / Civil Service fast streamer / Similar devices) ?[]  - 0 ?Simple Staple / Suture removal (25 or less) ?[]  - 0 ?Complex Staple / Suture removal (26 or more) ?[]  - 0 ?Hypo / Hyperglycemic Management (close monitor of Blood Glucose) ?[]  - 0 ?Ankle / Brachial Index (ABI) - do not check if billed separately ?X- 1 5 ?Vital Signs ?Has the patient been seen at the hospital within the last three years: Yes ?Total Score: 175 ?Level Of Care: New/Established - Level ?5 ?Electronic Signature(s) ?Signed: 10/08/2021 4:28:26 PM By: Gretta Cool, BSN, RN, CWS, Kim RN, BSN ?Entered By: Gretta Cool, BSN, RN, CWS, Kim on 10/08/2021 13:49:15 ?[name], [name] ([id number]) ?-------------------------------------------------------------------------------- ?Encounter Discharge Information Details ?Patient Name: [name] ?Date of Service: 10/08/2021 10:30 AM ?Medical Record Number: [id number] ?Patient Account Number: [id number] ?Date of Birth/Sex: [birth year] (66 y.o. F) ?Treating RN: Cornell Barman ?Primary Care Nathin Saran: [name] Other Clinician: ?Referring Brentley Horrell: [name] ?Treating Dashton Czerwinski/Extender: Jeri Cos ?Weeks in Treatment: 18 ?Encounter Discharge Information Items ?Discharge Condition: Stable ?Ambulatory Status: Stretcher ?Discharge Destination: [place] ?Telephoned: No ?Orders Sent: Yes ?Transportation: Ambulance ?Accompanied By: ems ?Schedule Follow-up Appointment: Yes ?Clinical Summary of Care: ?Electronic Signature(s) ?Signed: 10/08/2021 1:51:50 PM By: Gretta Cool, BSN, RN, CWS, Kim RN, BSN ?Entered By: Gretta Cool, BSN, RN, CWS, Kim on 10/08/2021 13:51:50 ?RIGBY, SWAMY ([id number]) ?-------------------------------------------------------------------------------- ?Lower Extremity Assessment Details ?Patient Name: [name] ?Date of Service: 10/08/2021 10:30 AM ?Medical Record Number: [id number] ?Patient Account Number: [id number] ?Date of Birth/Sex: 1956/03/15 (66 y.o. F) ?Treating RN: Cornell Barman ?Primary Care Graysyn Bache: [name] Other Clinician: ?Referring Allahna Husband: [name] ?Treating Arnetha Silverthorne/Extender: Jeri Cos ?Weeks in Treatment: 18 ?Vascular Assessment ?Pulses: ?Dorsalis Pedis ?Palpable: [Left:Yes] [Right:Yes] ?Electronic Signature(s) ?Signed: 10/08/2021 1:32:14 PM By: Gretta Cool, BSN, RN, CWS, Kim RN, BSN ?Entered By: Gretta Cool, BSN, RN, CWS, Kim on 10/08/2021 13:32:14 ?HARNEET, NOBLETT ([id number]) ?-------------------------------------------------------------------------------- ?Multi Wound Chart Details ?Patient Name: [name] ?Date of Service: 10/08/2021 10:30 AM ?Medical Record Number: [id number] ?Patient Account Number: [id number] ?Date of Birth/Sex: 01-18-1956 (66 y.o. F) ?Treating RN: Cornell Barman ?Primary Care Pixie Burgener: [name] Other Clinician: ?Referring Airianna Kreischer: [name] ?Treating Cyree Chuong/Extender: Jeri Cos ?Weeks in Treatment: 18 ?Vital Signs ?Height(in): 65 ?Pulse(bpm): 66 ?Weight(lbs): 245.4 ?Blood Pressure(mmHg): 114/71 ?Body Mass Index(BMI): 40.8 ?Temperature(??F): 97.8 ?Respiratory Rate(breaths/min): 18 ?Photos: [60:No Photos] [61:No Photos] [62:No Photos] ?Wound Location: [60:Right, Posterior Lower Leg] [61:Left, Posterior Lower Leg] [62:Left, Midline, Posterior Lower Leg] ?Wounding Event: [60:Pressure  Injury] [61:Gradually Appeared] [62:Gradually Appeared] ?Primary Etiology: [60:Pressure Ulcer] [61:Lymphedema] [62:Lymphedema] ?Secondary Etiology: [60:N/A] [61:N/A] [62:N/A] ?Date Acquired: [60:05/17/2021] [61:06/11/2021] [62:1

## 2021-10-13 DIAGNOSIS — E039 Hypothyroidism, unspecified: Secondary | ICD-10-CM | POA: Diagnosis not present

## 2021-10-13 DIAGNOSIS — E119 Type 2 diabetes mellitus without complications: Secondary | ICD-10-CM | POA: Diagnosis not present

## 2021-10-13 DIAGNOSIS — G8929 Other chronic pain: Secondary | ICD-10-CM | POA: Diagnosis not present

## 2021-10-13 DIAGNOSIS — I890 Lymphedema, not elsewhere classified: Secondary | ICD-10-CM | POA: Diagnosis not present

## 2021-10-13 DIAGNOSIS — I509 Heart failure, unspecified: Secondary | ICD-10-CM | POA: Diagnosis not present

## 2021-10-13 DIAGNOSIS — I10 Essential (primary) hypertension: Secondary | ICD-10-CM | POA: Diagnosis not present

## 2021-10-13 DIAGNOSIS — U071 COVID-19: Secondary | ICD-10-CM | POA: Diagnosis not present

## 2021-10-19 DIAGNOSIS — G8929 Other chronic pain: Secondary | ICD-10-CM | POA: Diagnosis not present

## 2021-10-19 DIAGNOSIS — I10 Essential (primary) hypertension: Secondary | ICD-10-CM | POA: Diagnosis not present

## 2021-10-19 DIAGNOSIS — E039 Hypothyroidism, unspecified: Secondary | ICD-10-CM | POA: Diagnosis not present

## 2021-10-19 DIAGNOSIS — I509 Heart failure, unspecified: Secondary | ICD-10-CM | POA: Diagnosis not present

## 2021-10-19 DIAGNOSIS — I890 Lymphedema, not elsewhere classified: Secondary | ICD-10-CM | POA: Diagnosis not present

## 2021-10-19 DIAGNOSIS — E119 Type 2 diabetes mellitus without complications: Secondary | ICD-10-CM | POA: Diagnosis not present

## 2021-10-20 DIAGNOSIS — I10 Essential (primary) hypertension: Secondary | ICD-10-CM | POA: Diagnosis not present

## 2021-10-20 DIAGNOSIS — I87331 Chronic venous hypertension (idiopathic) with ulcer and inflammation of right lower extremity: Secondary | ICD-10-CM | POA: Diagnosis not present

## 2021-10-21 DIAGNOSIS — I509 Heart failure, unspecified: Secondary | ICD-10-CM | POA: Diagnosis not present

## 2021-10-21 DIAGNOSIS — G8929 Other chronic pain: Secondary | ICD-10-CM | POA: Diagnosis not present

## 2021-10-21 DIAGNOSIS — R110 Nausea: Secondary | ICD-10-CM | POA: Diagnosis not present

## 2021-10-21 DIAGNOSIS — I10 Essential (primary) hypertension: Secondary | ICD-10-CM | POA: Diagnosis not present

## 2021-10-21 DIAGNOSIS — I890 Lymphedema, not elsewhere classified: Secondary | ICD-10-CM | POA: Diagnosis not present

## 2021-10-21 DIAGNOSIS — E119 Type 2 diabetes mellitus without complications: Secondary | ICD-10-CM | POA: Diagnosis not present

## 2021-10-21 DIAGNOSIS — E039 Hypothyroidism, unspecified: Secondary | ICD-10-CM | POA: Diagnosis not present

## 2021-10-26 DIAGNOSIS — U071 COVID-19: Secondary | ICD-10-CM | POA: Diagnosis not present

## 2021-10-26 DIAGNOSIS — I10 Essential (primary) hypertension: Secondary | ICD-10-CM | POA: Diagnosis not present

## 2021-10-26 DIAGNOSIS — I890 Lymphedema, not elsewhere classified: Secondary | ICD-10-CM | POA: Diagnosis not present

## 2021-10-26 DIAGNOSIS — I509 Heart failure, unspecified: Secondary | ICD-10-CM | POA: Diagnosis not present

## 2021-10-26 DIAGNOSIS — G8929 Other chronic pain: Secondary | ICD-10-CM | POA: Diagnosis not present

## 2021-10-26 DIAGNOSIS — E039 Hypothyroidism, unspecified: Secondary | ICD-10-CM | POA: Diagnosis not present

## 2021-10-27 DIAGNOSIS — Z79899 Other long term (current) drug therapy: Secondary | ICD-10-CM | POA: Diagnosis not present

## 2021-10-27 DIAGNOSIS — F4321 Adjustment disorder with depressed mood: Secondary | ICD-10-CM | POA: Diagnosis not present

## 2021-10-27 DIAGNOSIS — F411 Generalized anxiety disorder: Secondary | ICD-10-CM | POA: Diagnosis not present

## 2021-10-27 DIAGNOSIS — R69 Illness, unspecified: Secondary | ICD-10-CM | POA: Diagnosis not present

## 2021-10-29 ENCOUNTER — Encounter: Payer: Medicare HMO | Attending: Physician Assistant | Admitting: Physician Assistant

## 2021-10-29 DIAGNOSIS — Z7401 Bed confinement status: Secondary | ICD-10-CM | POA: Diagnosis not present

## 2021-10-29 DIAGNOSIS — R5381 Other malaise: Secondary | ICD-10-CM | POA: Diagnosis not present

## 2021-10-29 DIAGNOSIS — E1110 Type 2 diabetes mellitus with ketoacidosis without coma: Secondary | ICD-10-CM | POA: Diagnosis not present

## 2021-10-29 DIAGNOSIS — L988 Other specified disorders of the skin and subcutaneous tissue: Secondary | ICD-10-CM | POA: Diagnosis not present

## 2021-10-29 DIAGNOSIS — R29898 Other symptoms and signs involving the musculoskeletal system: Secondary | ICD-10-CM | POA: Diagnosis not present

## 2021-10-29 DIAGNOSIS — L97222 Non-pressure chronic ulcer of left calf with fat layer exposed: Secondary | ICD-10-CM | POA: Diagnosis not present

## 2021-10-29 DIAGNOSIS — L97225 Non-pressure chronic ulcer of left calf with muscle involvement without evidence of necrosis: Secondary | ICD-10-CM | POA: Diagnosis not present

## 2021-10-29 DIAGNOSIS — I890 Lymphedema, not elsewhere classified: Secondary | ICD-10-CM | POA: Diagnosis not present

## 2021-10-29 DIAGNOSIS — I959 Hypotension, unspecified: Secondary | ICD-10-CM | POA: Diagnosis not present

## 2021-10-29 NOTE — Progress Notes (Addendum)
[name] ([id number]) ?Visit Report for 10/29/2021 ?Chief Complaint Document Details ?Patient Name: [name] ?Date of Service: 10/29/2021 10:30 AM ?Medical Record Number: [id number] ?Patient Account Number: [id number] ?Date of Birth/Sex: 1955/08/07 (66 y.o. 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 New sacral wound as of 10/29/21 ?Electronic Signature(s) ?Signed: 10/29/2021 11:15:37 AM By: Worthy Keeler PA-C ?Previous Signature: 10/29/2021 11:10:40 AM Version By: Worthy Keeler PA-C ?Entered By: Worthy Keeler on 10/29/2021 11:15:37 ?[name] ([id number]) ?-------------------------------------------------------------------------------- ?HPI Details ?Patient Name: [name] ?Date of Service: 10/29/2021 10:30 AM ?Medical Record Number: [id number] ?Patient Account Number: [id number] ?Date of Birth/Sex: 1955/11/22 (66 y.o. F) ?Treating RN: Levora Dredge ?Primary Care Provider: [name] Other Clinician: ?Referring Provider: [name] ?Treating Provider/Extender: Jeri Cos ?Weeks in Treatment: 21 ?History of Present Illness ?HPI Description: 66yo w/ h/o BLE phlebolymphedema, type 2 DM (unknown hemoglobin A1c), and obesity. No PVD. ?h/o chronic, recurrent BLE calf ulcers. ?Treated with 4 layer compression. Infrequently uses lymphedema pump. ?Bilateral lower extremity ulcerations healed as of June 2016. ?Fitted for custom compression stockings but did not receive them. Patient could not travel for lymphedema consult. ?She developed recurrent bilateral lower extremity ulcerations in August 2016. She is able to heal these quite quickly with 4 layer elastic ?compression bandages. However, she is not very compliant using  her juxtalite compression garments and tends to develop recurrent ulcerations ?while using juxtalite. ?Unna boots were applied this past week. She tolerated this well but performs minimal ambulation. She is scheduled to undergo total hip ?replacement tomorrow with Dr. Rudene Christians. She is unsure if he is aware of her ulcerations. ?She is otherwise without complaints. No significant pain. No fever or chills. Moderate clear drainage. Not on any antibiotics. ?06/04/2015 -- she did have her hip surgery and has now been in rehabilitation and they're controlling her fluid intake and diuretics to the extent ?that she has lost 13 pounds of weight and her lower extremity circumference is decreased by 7 cm. ?06/18/2015 -- she is still in the rehabilitation facility and continues to be looked after well and has lost a total of 15 pounds and her diabetes is ?under much better control. ?Readmission: ?06/03/2021 upon evaluation today patient actually appears to be doing somewhat poorly in regard to a wound on the right posterior lower leg. ?Patient does have a history of previously having been seen here in the clinic but this predates my time here back in 2017. Subsequently ?unfortunately right now she is having a significant issue with bleeding in the posterior of her right calf this appears to be a deep tissue injury as ?developed into an ulceration. ?She does have a history significant for lymphedema, diabetes mellitus type 2, diabetic neuropathy, hypertension, hypothyroidism, and obesity. ?She is currently in a skilled nursing facility. ?The dressing that she had in place during evaluation today was Xeroform followed by significant amount of gauze and this was severely stuck. ?Upon removal the patient was having significant bleeding she also had significant work done in the hospital in regard to her blood flow and Dr. Lucky Cowboy does look like he has her on 2 blood thinners Plavix and Lovenox. This is probably part of the reason why she is  bleeding so significantly. ?With that being said I do believe that based on what we are seeing  currently this is probably can to be something that were getting have to try to ?get stopped with more than just pressure to the region currently. I think surgery foam is probably can to be necessary. If we can get the stop of ?the Surgifoam she is probably can have to go to the hospital to try to get this bleeding stopped. ?12/30; this is a patient who is currently in a skilled facility. She has wounds on her bilateral calfs likely secondary to severe chronic venous ?insufficiency and lymphedema the care of these wounds is complicated by the fact that she is on Lovenox and Plavix and bleeds very frequently ?with even minimal trauma to these wounds. It would appear that she had a Vaseline gauze on the surface of these in the process of undressing ?them she bled freely from especially the right but also the left. I had to go in and use silver nitrate. ?The skin on her legs is significant for chronic venous stasis and lymphedema. Very dry flaking skin ?07/16/2021; I actually was a patent person who saw this woman 3 weeks ago. She is a nursing facility has chronic venous wounds and bilateral ?lower extremity wounds. We use Xeroform under compression. The time she was on Lovenox and Plavix I thought I had an excellent reason for ?these wounds to bleed so freely. Since then the Lovenox has been stopped and should be well out of her system but she is still on Plavix. She still ?has very friable bleeding wounds on the left lateral left Posterior and right Posterior lower extremity.These believe very freely when we take off ?the dressing ?Also she has a left heel wound which when we saw her last time was a surgical wound however I gather we have been asked to follow this as well. ?I am not exactly sure at this time what her surgery was but she comes in with a wound VAC on this and they are apparently changing it at the ?nursing  home. ?08/27/2021 upon evaluation patient appears to be doing better in regard to her wound on the legs as well as the heel. Fortunately at this point I do ?not see any need for a wound VAC which is good news overall I think that the Xeroform is probably a good option that is what is being utilized ?here as well. Fortunately I do not see any signs of active infection locally nor systemically which is great news and overall I think that the patient ?is making excellent progress. The area on her right gluteal area actually appears to be doing quite well and I am pleased with that. I do believe ?the silver alginate dressing here could be of benefit for her. ?09/17/2021 upon evaluation patient appears to be doing actually better in regard to her wounds on the legs. Fortunately she does not seem to be ?showing any signs of worsening which is great news and overall very pleased with where we stand. I do believe that she is tolerating the dressing ?changes without complication which is great news. ?10-08-2021 upon evaluation today patient unfortunately continues to have significant issues with her legs. Unfortunately the biggest issue that we ?see Currently is that she is still having a lot of bleeding from her legs that she is also not able to move well due to her knee pain her left knee is ?quite significant as far as the discomfort is concerned and the right knee is getting equally as bad. Fortunately I do not see any evidence of  active ?infection locally or systemically which is great news. Nonetheless I do believe that her heels are looking much better there is a tiny area open on ?DAISIE, HAFT ([id number]) ?the left heel the right is doing quite well. In regard to the gluteal/sacral region this appears to be healed. ?10-29-2021 upon evaluation today patient unfortunately is appearing to do much worse compared to normal. She has more breakdown in regard to ?the left lateral leg and again I think this is pressure  related. She also has more breakdown in regard to the gluteal area and she is very concerned ?about this in particular which is completely understandable. With that being said I do believe that she would ben

## 2021-10-29 NOTE — Progress Notes (Signed)
NEVAH, DALAL ([id number]) ?Visit Report for 10/29/2021 ?Arrival Information Details ?Patient Name: [name] ?Date of Service: 10/29/2021 10:30 AM ?Medical Record Number: [id number] ?Patient Account Number: [id number] ?Date of Birth/Sex: 17-Apr-1956 (66 y.o. F) ?Treating RN: Levora Dredge ?Primary Care Tariah Transue: [name] Other Clinician: ?Referring Rajendra Spiller: [name] ?Treating Yasiel Goyne/Extender: Jeri Cos ?Weeks in Treatment: 21 ?Visit Information History Since Last Visit ?Added or deleted any medications: No ?Patient Arrived: Stretcher ?Any new allergies or adverse reactions: No ?Arrival Time: 10:40 ?Had a fall or experienced change in No ?Accompanied By: self/transport ?activities of daily living that may affect ?Transfer Assistance: Manual ?risk of falls: ?Patient Identification Verified: Yes ?Hospitalized since last visit: No ?Secondary Verification Process Completed: Yes ?Has Dressing in Place as Prescribed: Yes ?Patient Requires Transmission-Based No ?Pain Present Now: No ?Precautions: ?Patient Has Alerts: Yes ?Patient Alerts: Patient on Blood ?Thinner ?TYPE II Diabetic ?Plavix and Lovenox ?[facility] ?Rehab ?Electronic Signature(s) ?Signed: 10/29/2021 3:51:04 PM By: Levora Dredge ?Entered By: Levora Dredge on 10/29/2021 10:41:08 ?[name], [name] ([id number]) ?-------------------------------------------------------------------------------- ?Clinic Level of Care Assessment Details ?Patient Name: [name] ?Date of Service: 10/29/2021 10:30 AM ?Medical Record Number: [id number] ?Patient Account Number: [id number] ?Date of Birth/Sex: Feb 06, 1956 (66 y.o. F) ?Treating RN: Levora Dredge ?Primary Care Saidah Kempton: [name] Other Clinician: ?Referring Heiress Williamson: [name] ?Treating Alyviah Crandle/Extender: Jeri Cos ?Weeks in Treatment: 21 ?Clinic Level of Care Assessment Items ?TOOL 4 Quantity Score ?[]  - Use when only an EandM is performed on FOLLOW-UP visit 0 ?ASSESSMENTS -  Nursing Assessment / Reassessment ?X - Reassessment of Co-morbidities (includes updates in patient status) 1 10 ?X- 1 5 ?Reassessment of Adherence to Treatment Plan ?ASSESSMENTS - Wound and Skin Assessment / Reassessment ?[]  - Simple Wound Assessment / Reassessment - one wound 0 ?X- 5 5 ?Complex Wound Assessment / Reassessment - multiple wounds ?[]  - 0 ?Dermatologic / Skin Assessment (not related to wound area) ?ASSESSMENTS - Focused Assessment ?[]  - Circumferential Edema Measurements - multi extremities 0 ?[]  - 0 ?Nutritional Assessment / Counseling / Intervention ?[]  - 0 ?Lower Extremity Assessment (monofilament, tuning fork, pulses) ?[]  - 0 ?Peripheral Arterial Disease Assessment (using hand held doppler) ?ASSESSMENTS - Ostomy and/or Continence Assessment and Care ?[]  - Incontinence Assessment and Management 0 ?[]  - 0 ?Ostomy Care Assessment and Management (repouching, etc.) ?PROCESS - Coordination of Care ?X - Simple Patient / Family Education for ongoing care 1 15 ?[]  - 0 ?Complex (extensive) Patient / Family Education for ongoing care ?[]  - 0 ?Staff obtains Consents, Records, Test Results / Process Orders ?[]  - 0 ?Staff telephones HHA, Nursing Homes / Clarify orders / etc ?[]  - 0 ?Routine Transfer to another Facility (non-emergent condition) ?[]  - 0 ?Routine Hospital Admission (non-emergent condition) ?[]  - 0 ?New Admissions / Biomedical engineer / Ordering NPWT, Apligraf, etc. ?[]  - 0 ?Emergency Hospital Admission (emergent condition) ?X- 1 10 ?Simple Discharge Coordination ?[]  - 0 ?Complex (extensive) Discharge Coordination ?PROCESS - Special Needs ?[]  - Pediatric / Minor Patient Management 0 ?[]  - 0 ?Isolation Patient Management ?[]  - 0 ?Hearing / Language / Visual special needs ?[]  - 0 ?Assessment of Community assistance (transportation, D/C planning, etc.) ?[]  - 0 ?Additional assistance / Altered mentation ?[]  - 0 ?Support Surface(s) Assessment (bed, cushion, seat, etc.) ?INTERVENTIONS - Wound  Cleansing / Measurement ?[name], [name] ([id number]) ?[]  - 0 ?Simple Wound Cleansing - one wound ?X- 5 5 ?Complex Wound Cleansing - multiple wounds ?X- 1 5 ?Wound Imaging (photographs - any number of wounds) ?[]  -  0 ?Wound Tracing (instead of photographs) ?[]  - 0 ?Simple Wound Measurement - one wound ?X- 5 5 ?Complex Wound Measurement - multiple wounds ?INTERVENTIONS - Wound Dressings ?[]  - Small Wound Dressing one or multiple wounds 0 ?X- 5 15 ?Medium Wound Dressing one or multiple wounds ?[]  - 0 ?Large Wound Dressing one or multiple wounds ?[]  - 0 ?Application of Medications - topical ?[]  - 0 ?Application of Medications - injection ?INTERVENTIONS - Miscellaneous ?[]  - External ear exam 0 ?[]  - 0 ?Specimen Collection (cultures, biopsies, blood, body fluids, etc.) ?[]  - 0 ?Specimen(s) / Culture(s) sent or taken to Lab for analysis ?[]  - 0 ?Patient Transfer (multiple staff / Civil Service fast streamer / Similar devices) ?[]  - 0 ?Simple Staple / Suture removal (25 or less) ?[]  - 0 ?Complex Staple / Suture removal (26 or more) ?[]  - 0 ?Hypo / Hyperglycemic Management (close monitor of Blood Glucose) ?[]  - 0 ?Ankle / Brachial Index (ABI) - do not check if billed separately ?X- 1 5 ?Vital Signs ?Has the patient been seen at the hospital within the last three years: Yes ?Total Score: 200 ?Level Of Care: New/Established - Level ?5 ?Electronic Signature(s) ?Signed: 10/29/2021 3:51:04 PM By: Levora Dredge ?Entered By: Levora Dredge on 10/29/2021 12:47:11 ?[name], [name] ([id number]) ?-------------------------------------------------------------------------------- ?Encounter Discharge Information Details ?Patient Name: [name] ?Date of Service: 10/29/2021 10:30 AM ?Medical Record Number: [id number] ?Patient Account Number: [id number] ?Date of Birth/Sex: 05-20-1956 (66 y.o. F) ?Treating RN: Levora Dredge ?Primary Care Euriah Matlack: [name] Other Clinician: ?Referring Geoff Dacanay: [name] ?Treating Jakory Matsuo/Extender:  Jeri Cos ?Weeks in Treatment: 21 ?Encounter Discharge Information Items ?Discharge Condition: Stable ?Ambulatory Status: Stretcher ?Discharge Destination: [place] ?Telephoned: No ?Orders Sent: Yes ?Transportation: Other ?Accompanied By: self ?Schedule Follow-up Appointment: Yes ?Clinical Summary of Care: Patient Declined ?Electronic Signature(s) ?Signed: 10/29/2021 12:49:17 PM By: Levora Dredge ?Previous Signature: 10/29/2021 12:48:33 PM Version By: Levora Dredge ?Entered By: Levora Dredge on 10/29/2021 12:49:17 ?[name], [name] ([id number]) ?-------------------------------------------------------------------------------- ?Lower Extremity Assessment Details ?Patient Name: [name] ?Date of Service: 10/29/2021 10:30 AM ?Medical Record Number: [id number] ?Patient Account Number: [id number] ?Date of Birth/Sex: 15-Sep-1955 (66 y.o. F) ?Treating RN: Levora Dredge ?Primary Care Breylen Agyeman: [name] Other Clinician: ?Referring Hodaya Curto: [name] ?Treating Kameren Baade/Extender: Jeri Cos ?Weeks in Treatment: 21 ?Electronic Signature(s) ?Signed: 10/29/2021 3:51:04 PM By: Levora Dredge ?Entered By: Levora Dredge on 10/29/2021 11:08:06 ?[name], [name] ([id number]) ?-------------------------------------------------------------------------------- ?Multi Wound Chart Details ?Patient Name: [name] ?Date of Service: 10/29/2021 10:30 AM ?Medical Record Number: [id number] ?Patient Account Number: [id number] ?Date of Birth/Sex: 1956/03/20 (66 y.o. F) ?Treating RN: Levora Dredge ?Primary Care Velina Drollinger: [name] Other Clinician: ?Referring Jacque Garrels: [name] ?Treating Tyshawna Alarid/Extender: Jeri Cos ?Weeks in Treatment: 21 ?Vital Signs ?Height(in): 65 ?Pulse(bpm): 61 ?Weight(lbs): 245.4 ?Blood Pressure(mmHg): 105/61 ?Body Mass Index(BMI): 40.8 ?Temperature(??F): 97.8 ?Respiratory Rate(breaths/min): 18 ?Photos: ?Wound Location: Right, Posterior Lower Leg Left, Posterior Lower  Leg Left, Midline, Posterior Lower Leg ?Wounding Event: Pressure Injury Gradually Appeared Gradually Appeared ?Primary Etiology: Pressure Ulcer Lymphedema Lymphedema ?Secondary Etiology: N/A N/A N/A ?Comorbid Hist

## 2021-10-30 ENCOUNTER — Inpatient Hospital Stay
Admission: EM | Admit: 2021-10-30 | Discharge: 2021-11-03 | DRG: 637 | Disposition: A | Discharge status: Skilled Nursing Facility | Payer: Medicare HMO | Source: Skilled Nursing Facility | Attending: Internal Medicine | Admitting: Internal Medicine

## 2021-10-30 ENCOUNTER — Inpatient Hospital Stay: Payer: Medicare HMO

## 2021-10-30 ENCOUNTER — Other Ambulatory Visit: Payer: Self-pay

## 2021-10-30 ENCOUNTER — Emergency Department: Payer: Medicare HMO

## 2021-10-30 DIAGNOSIS — E039 Hypothyroidism, unspecified: Secondary | ICD-10-CM | POA: Diagnosis present

## 2021-10-30 DIAGNOSIS — Z89421 Acquired absence of other right toe(s): Secondary | ICD-10-CM

## 2021-10-30 DIAGNOSIS — I10 Essential (primary) hypertension: Secondary | ICD-10-CM | POA: Diagnosis not present

## 2021-10-30 DIAGNOSIS — L8990 Pressure ulcer of unspecified site, unspecified stage: Secondary | ICD-10-CM | POA: Diagnosis present

## 2021-10-30 DIAGNOSIS — N281 Cyst of kidney, acquired: Secondary | ICD-10-CM | POA: Diagnosis not present

## 2021-10-30 DIAGNOSIS — L89153 Pressure ulcer of sacral region, stage 3: Secondary | ICD-10-CM | POA: Diagnosis present

## 2021-10-30 DIAGNOSIS — E1169 Type 2 diabetes mellitus with other specified complication: Secondary | ICD-10-CM | POA: Diagnosis not present

## 2021-10-30 DIAGNOSIS — E1151 Type 2 diabetes mellitus with diabetic peripheral angiopathy without gangrene: Secondary | ICD-10-CM | POA: Diagnosis present

## 2021-10-30 DIAGNOSIS — Z79899 Other long term (current) drug therapy: Secondary | ICD-10-CM | POA: Diagnosis not present

## 2021-10-30 DIAGNOSIS — Z79890 Hormone replacement therapy (postmenopausal): Secondary | ICD-10-CM | POA: Diagnosis not present

## 2021-10-30 DIAGNOSIS — I87313 Chronic venous hypertension (idiopathic) with ulcer of bilateral lower extremity: Secondary | ICD-10-CM | POA: Diagnosis present

## 2021-10-30 DIAGNOSIS — Z89411 Acquired absence of right great toe: Secondary | ICD-10-CM | POA: Diagnosis not present

## 2021-10-30 DIAGNOSIS — Z743 Need for continuous supervision: Secondary | ICD-10-CM | POA: Diagnosis not present

## 2021-10-30 DIAGNOSIS — Z6831 Body mass index (BMI) 31.0-31.9, adult: Secondary | ICD-10-CM | POA: Diagnosis not present

## 2021-10-30 DIAGNOSIS — N189 Chronic kidney disease, unspecified: Secondary | ICD-10-CM | POA: Diagnosis not present

## 2021-10-30 DIAGNOSIS — L89893 Pressure ulcer of other site, stage 3: Secondary | ICD-10-CM

## 2021-10-30 DIAGNOSIS — Z794 Long term (current) use of insulin: Secondary | ICD-10-CM | POA: Diagnosis not present

## 2021-10-30 DIAGNOSIS — L89309 Pressure ulcer of unspecified buttock, unspecified stage: Secondary | ICD-10-CM | POA: Diagnosis not present

## 2021-10-30 DIAGNOSIS — Z96641 Presence of right artificial hip joint: Secondary | ICD-10-CM | POA: Diagnosis not present

## 2021-10-30 DIAGNOSIS — E1110 Type 2 diabetes mellitus with ketoacidosis without coma: Principal | ICD-10-CM | POA: Diagnosis present

## 2021-10-30 DIAGNOSIS — I129 Hypertensive chronic kidney disease with stage 1 through stage 4 chronic kidney disease, or unspecified chronic kidney disease: Secondary | ICD-10-CM | POA: Diagnosis not present

## 2021-10-30 DIAGNOSIS — Z7902 Long term (current) use of antithrombotics/antiplatelets: Secondary | ICD-10-CM | POA: Diagnosis not present

## 2021-10-30 DIAGNOSIS — I739 Peripheral vascular disease, unspecified: Secondary | ICD-10-CM | POA: Diagnosis present

## 2021-10-30 DIAGNOSIS — Z833 Family history of diabetes mellitus: Secondary | ICD-10-CM

## 2021-10-30 DIAGNOSIS — K8020 Calculus of gallbladder without cholecystitis without obstruction: Secondary | ICD-10-CM | POA: Diagnosis not present

## 2021-10-30 DIAGNOSIS — N179 Acute kidney failure, unspecified: Secondary | ICD-10-CM | POA: Diagnosis present

## 2021-10-30 DIAGNOSIS — L97919 Non-pressure chronic ulcer of unspecified part of right lower leg with unspecified severity: Secondary | ICD-10-CM | POA: Diagnosis not present

## 2021-10-30 DIAGNOSIS — E785 Hyperlipidemia, unspecified: Secondary | ICD-10-CM | POA: Diagnosis not present

## 2021-10-30 DIAGNOSIS — N1831 Chronic kidney disease, stage 3a: Secondary | ICD-10-CM | POA: Diagnosis present

## 2021-10-30 DIAGNOSIS — E1122 Type 2 diabetes mellitus with diabetic chronic kidney disease: Secondary | ICD-10-CM | POA: Diagnosis present

## 2021-10-30 DIAGNOSIS — L97929 Non-pressure chronic ulcer of unspecified part of left lower leg with unspecified severity: Secondary | ICD-10-CM | POA: Diagnosis not present

## 2021-10-30 DIAGNOSIS — Z7401 Bed confinement status: Secondary | ICD-10-CM | POA: Diagnosis not present

## 2021-10-30 DIAGNOSIS — I4891 Unspecified atrial fibrillation: Secondary | ICD-10-CM | POA: Diagnosis not present

## 2021-10-30 DIAGNOSIS — R1031 Right lower quadrant pain: Secondary | ICD-10-CM | POA: Diagnosis not present

## 2021-10-30 DIAGNOSIS — R5381 Other malaise: Secondary | ICD-10-CM | POA: Diagnosis not present

## 2021-10-30 DIAGNOSIS — I700 Atherosclerosis of aorta: Secondary | ICD-10-CM | POA: Diagnosis not present

## 2021-10-30 DIAGNOSIS — R1030 Lower abdominal pain, unspecified: Principal | ICD-10-CM

## 2021-10-30 DIAGNOSIS — R1084 Generalized abdominal pain: Secondary | ICD-10-CM | POA: Diagnosis not present

## 2021-10-30 LAB — URINALYSIS, ROUTINE W REFLEX MICROSCOPIC
Bilirubin Urine: NEGATIVE
Glucose, UA: NEGATIVE mg/dL
Ketones, ur: 5 mg/dL — AB
Nitrite: NEGATIVE
Protein, ur: NEGATIVE mg/dL
Specific Gravity, Urine: 1.02 (ref 1.005–1.030)
Squamous Epithelial / HPF: NONE SEEN (ref 0–5)
pH: 5 (ref 5.0–8.0)

## 2021-10-30 LAB — COMPREHENSIVE METABOLIC PANEL
ALT: 8 U/L (ref 0–44)
AST: 21 U/L (ref 15–41)
Albumin: 2.7 g/dL — ABNORMAL LOW (ref 3.5–5.0)
Alkaline Phosphatase: 86 U/L (ref 38–126)
Anion gap: 17 — ABNORMAL HIGH (ref 5–15)
BUN: 19 mg/dL (ref 8–23)
CO2: 20 mmol/L — ABNORMAL LOW (ref 22–32)
Calcium: 9.1 mg/dL (ref 8.9–10.3)
Chloride: 95 mmol/L — ABNORMAL LOW (ref 98–111)
Creatinine, Ser: 1.53 mg/dL — ABNORMAL HIGH (ref 0.44–1.00)
GFR, Estimated: 37 mL/min — ABNORMAL LOW (ref >60)
Glucose, Bld: 214 mg/dL — ABNORMAL HIGH (ref 70–99)
Potassium: 4.2 mmol/L (ref 3.5–5.1)
Sodium: 132 mmol/L — ABNORMAL LOW (ref 135–145)
Total Bilirubin: 1 mg/dL (ref 0.3–1.2)
Total Protein: 7.9 g/dL (ref 6.5–8.1)

## 2021-10-30 LAB — LIPASE, BLOOD: Lipase: 29 U/L (ref 11–51)

## 2021-10-30 LAB — CBC
HCT: 36.8 % (ref 36.0–46.0)
Hemoglobin: 11.5 g/dL — ABNORMAL LOW (ref 12.0–15.0)
MCH: 24.8 pg — ABNORMAL LOW (ref 26.0–34.0)
MCHC: 31.3 g/dL (ref 30.0–36.0)
MCV: 79.5 fL — ABNORMAL LOW (ref 80.0–100.0)
Platelets: 524 10*3/uL — ABNORMAL HIGH (ref 150–400)
RBC: 4.63 MIL/uL (ref 3.87–5.11)
RDW: 16.8 % — ABNORMAL HIGH (ref 11.5–15.5)
WBC: 8.9 10*3/uL (ref 4.0–10.5)
nRBC: 0 % (ref 0.0–0.2)

## 2021-10-30 LAB — GLUCOSE, CAPILLARY: Glucose-Capillary: 189 mg/dL — ABNORMAL HIGH (ref 70–99)

## 2021-10-30 LAB — BETA-HYDROXYBUTYRIC ACID: Beta-Hydroxybutyric Acid: 3.51 mmol/L — ABNORMAL HIGH (ref 0.05–0.27)

## 2021-10-30 LAB — CBG MONITORING, ED: Glucose-Capillary: 195 mg/dL — ABNORMAL HIGH (ref 70–99)

## 2021-10-30 MED ORDER — SODIUM CHLORIDE 0.9 % IV SOLN
12.5000 mg | Freq: Three times a day (TID) | INTRAVENOUS | Status: AC | PRN
  Administered 2021-10-31: 12.5 mg via INTRAVENOUS
  Filled 2021-10-30 (×2): qty 0.5

## 2021-10-30 MED ORDER — ENOXAPARIN SODIUM 60 MG/0.6ML IJ SOSY
0.5000 mg/kg | PREFILLED_SYRINGE | INTRAMUSCULAR | Status: DC
  Administered 2021-10-31 – 2021-11-02 (×3): 45 mg via SUBCUTANEOUS
  Filled 2021-10-30 (×3): qty 0.6

## 2021-10-30 MED ORDER — DEXTROSE IN LACTATED RINGERS 5 % IV SOLN: INTRAVENOUS | Status: DC

## 2021-10-30 MED ORDER — AMLODIPINE BESYLATE 10 MG PO TABS
10.0000 mg | ORAL_TABLET | Freq: Every day | ORAL | Status: DC
  Administered 2021-10-31 – 2021-11-03 (×4): 10 mg via ORAL
  Filled 2021-10-30 (×4): qty 1

## 2021-10-30 MED ORDER — NYSTATIN 100000 UNIT/GM EX POWD
1.0000 "application " | Freq: Two times a day (BID) | CUTANEOUS | Status: DC
  Administered 2021-10-31 – 2021-11-03 (×7): 1 via TOPICAL
  Filled 2021-10-30 (×2): qty 15

## 2021-10-30 MED ORDER — MORPHINE SULFATE (PF) 2 MG/ML IV SOLN
2.0000 mg | Freq: Once | INTRAVENOUS | Status: AC
  Administered 2021-10-30: 2 mg via INTRAVENOUS
  Filled 2021-10-30: qty 1

## 2021-10-30 MED ORDER — IOHEXOL 300 MG/ML  SOLN
100.0000 mL | Freq: Once | INTRAMUSCULAR | Status: AC | PRN
  Administered 2021-10-30: 80 mL via INTRAVENOUS

## 2021-10-30 MED ORDER — LEVOTHYROXINE SODIUM 112 MCG PO TABS
112.0000 ug | ORAL_TABLET | Freq: Every day | ORAL | Status: DC
  Administered 2021-10-31 – 2021-11-03 (×4): 112 ug via ORAL
  Filled 2021-10-30 (×4): qty 1

## 2021-10-30 MED ORDER — LACTATED RINGERS IV BOLUS
1000.0000 mL | Freq: Once | INTRAVENOUS | Status: AC
  Administered 2021-10-30: 1000 mL via INTRAVENOUS

## 2021-10-30 MED ORDER — ONDANSETRON HCL 4 MG/2ML IJ SOLN
4.0000 mg | Freq: Once | INTRAMUSCULAR | Status: AC
  Administered 2021-10-30: 4 mg via INTRAVENOUS
  Filled 2021-10-30: qty 2

## 2021-10-30 MED ORDER — HYDRALAZINE HCL 20 MG/ML IJ SOLN
5.0000 mg | Freq: Four times a day (QID) | INTRAMUSCULAR | Status: DC | PRN
Start: 2021-10-30 — End: 2021-11-03

## 2021-10-30 MED ORDER — INSULIN REGULAR(HUMAN) IN NACL 100-0.9 UT/100ML-% IV SOLN: INTRAVENOUS | Status: DC

## 2021-10-30 MED ORDER — ONDANSETRON HCL 4 MG/2ML IJ SOLN
4.0000 mg | Freq: Four times a day (QID) | INTRAMUSCULAR | Status: DC | PRN
  Administered 2021-10-31 – 2021-11-01 (×2): 4 mg via INTRAVENOUS
  Filled 2021-10-30 (×2): qty 2

## 2021-10-30 MED ORDER — LACTATED RINGERS IV SOLN: INTRAVENOUS | Status: DC

## 2021-10-30 MED ORDER — ONDANSETRON HCL 4 MG PO TABS
4.0000 mg | ORAL_TABLET | Freq: Four times a day (QID) | ORAL | Status: DC | PRN
  Administered 2021-11-01: 4 mg via ORAL
  Filled 2021-10-30: qty 1

## 2021-10-30 MED ORDER — CLOPIDOGREL BISULFATE 75 MG PO TABS
75.0000 mg | ORAL_TABLET | Freq: Every day | ORAL | Status: DC
  Administered 2021-10-31 – 2021-11-03 (×4): 75 mg via ORAL
  Filled 2021-10-30 (×4): qty 1

## 2021-10-30 MED ORDER — DEXTROSE 50 % IV SOLN: 0.0000 mL | INTRAVENOUS | Status: DC | PRN

## 2021-10-30 MED ORDER — METOPROLOL SUCCINATE ER 25 MG PO TB24
12.5000 mg | ORAL_TABLET | Freq: Every day | ORAL | Status: DC
  Administered 2021-10-31 – 2021-11-03 (×4): 12.5 mg via ORAL
  Filled 2021-10-30: qty 0.5
  Filled 2021-10-30 (×3): qty 1

## 2021-10-30 MED ORDER — ATORVASTATIN CALCIUM 20 MG PO TABS
20.0000 mg | ORAL_TABLET | Freq: Every day | ORAL | Status: DC
  Administered 2021-10-31 – 2021-11-03 (×4): 20 mg via ORAL
  Filled 2021-10-30 (×4): qty 1

## 2021-10-30 MED ORDER — SODIUM CHLORIDE 0.9 % IV BOLUS
1000.0000 mL | Freq: Once | INTRAVENOUS | Status: AC
  Administered 2021-10-30: 1000 mL via INTRAVENOUS

## 2021-10-30 MED ORDER — VITAMIN D3 25 MCG (1000 UNIT) PO TABS
5000.0000 [IU] | ORAL_TABLET | Freq: Every day | ORAL | Status: DC
  Administered 2021-10-31 – 2021-11-03 (×4): 5000 [IU] via ORAL
  Filled 2021-10-30 (×7): qty 5

## 2021-10-30 MED ORDER — OXYCODONE HCL 5 MG PO TABS
5.0000 mg | ORAL_TABLET | Freq: Four times a day (QID) | ORAL | Status: AC | PRN
Start: 2021-10-30 — End: 2021-10-31
  Administered 2021-10-31 (×2): 5 mg via ORAL
  Filled 2021-10-30 (×2): qty 1

## 2021-10-30 NOTE — ED Provider Notes (Signed)
? ?[facility] ?Provider Note ? ? ? Event Date/Time  ? First MD Initiated Contact with Patient 10/30/21 1404   ?  (approximate) ? ? ?History  ? ?Abdominal Pain ? ? ?HPI ? ?[name] is a 66 y.o. female with a history of diabetes, CKD, sepsis, peripheral vascular disease who presents with complaints of right lower quadrant abdominal pain.  She reports she has been hurting for several days, started having nausea and vomiting today.  Multiple episodes of vomiting.  No fevers reported. ?  ? ? ?Physical Exam  ? ?Triage Vital Signs: ?ED Triage Vitals  ?Enc Vitals Group  ?   BP 10/30/21 1402 (!) 152/58  ?   Pulse Rate 10/30/21 1402 (!) 119  ?   Resp 10/30/21 1402 20  ?   Temp 10/30/21 1402 98.5 ?F (36.9 ?C)  ?   Temp Source 10/30/21 1402 Oral  ?   SpO2 10/30/21 1402 100 %  ?   Weight 10/30/21 1405 90.7 kg (200 lb)  ?   Height 10/30/21 1405 1.651 m (5\' 5" )  ?   Head Circumference --   ?   Peak Flow --   ?   Pain Score 10/30/21 1404 6  ?   Pain Loc --   ?   Pain Edu? --   ?   Excl. in [place]? --   ? ? ?Most recent vital signs: ?Vitals:  ? 10/30/21 1402 10/30/21 1430  ?BP: (!) 152/58 139/65  ?Pulse: (!) 119 93  ?Resp: 20 12  ?Temp: 98.5 ?F (36.9 ?C)   ?SpO2: 100% 97%  ? ? ? ?General: Awake, no distress.  ?CV:  Good peripheral perfusion.  Tachycardia ?Resp:  Normal effort.  ?Abd:  No distention, tenderness palpation primarily in the lower abdomen, right greater than left, no CVA tenderness  ?Other:   ? ? ?ED Results / Procedures / Treatments  ? ?Labs ?(all labs ordered are listed, but only abnormal results are displayed) ?Labs Reviewed  ?CBC - Abnormal; Notable for the following components:  ?    Result Value  ? Hemoglobin 11.5 (*)   ? MCV 79.5 (*)   ? MCH 24.8 (*)   ? RDW 16.8 (*)   ? Platelets 524 (*)   ? All other components within normal limits  ?COMPREHENSIVE METABOLIC PANEL  ?LIPASE, BLOOD  ?URINALYSIS, ROUTINE W REFLEX MICROSCOPIC  ? ? ? ?EKG ? ? ? ? ?RADIOLOGY ?CT abdomen pelvis  pending ? ? ? ?PROCEDURES: ? ?Critical Care performed:  ? ?Procedures ? ? ?MEDICATIONS ORDERED IN ED: ?Medications  ?morphine (PF) 2 MG/ML injection 2 mg (2 mg Intravenous Given 10/30/21 1423)  ?ondansetron (ZOFRAN) injection 4 mg (4 mg Intravenous Given 10/30/21 1422)  ? ? ? ?IMPRESSION / MDM / ASSESSMENT AND PLAN / ED COURSE  ?I reviewed the triage vital signs and the nursing notes. ? ?Patient presents with an acute illness that is potentially life-threatening ? ?She is tachycardic with abdominal pain as described above.  Differential includes appendicitis, diverticulitis, colitis, hollow viscus rupture, UTI, ureterolithiasis ? ?We will treat the patient with IV morphine, IV Zofran ? ?Have ordered labs urinalysis.  Anticipate that she will need a CT scan as well ? ?I have asked my colleague to follow-up on CT abdomen pelvis ? ?  ? ? ?FINAL CLINICAL IMPRESSION(S) / ED DIAGNOSES  ? ?Final diagnoses:  ?Lower abdominal pain  ? ? ? ?Rx / DC Orders  ? ?ED Discharge Orders   ? ?  None  ? ?  ? ? ? ?Note:  This document was prepared using Dragon voice recognition software and may include unintentional dictation errors. ?  ?Lavonia Drafts, MD ?10/30/21 1500 ? ?

## 2021-10-30 NOTE — Progress Notes (Signed)
PHARMACIST - PHYSICIAN COMMUNICATION ? ?CONCERNING:  Enoxaparin (Lovenox) for DVT Prophylaxis  ? ? ?RECOMMENDATION: ?Patient was prescribed enoxaprin 40mg  q24 hours for VTE prophylaxis.  ? Danley Danker Weights  ? 10/30/21 1405  ?Weight: 90.7 kg (200 lb)  ? ? ?Body mass index is 33.28 kg/m?. ? ?Estimated Creatinine Clearance: 39.2 mL/min (A) (by C-G formula based on SCr of 1.53 mg/dL (H)). ? ? ?Based on [place] patient is candidate for enoxaparin 0.5mg /kg TBW SQ every 24 hours based on BMI being >30. ? ? ?DESCRIPTION: ?Pharmacy has adjusted enoxaparin dose per [facility] policy. ? ?Patient is now receiving enoxaparin 0.5 mg/kg every 24 hours  ? ?Renda Rolls, PharmD, MBA ?10/30/2021 ?11:00 PM ? ? ?

## 2021-10-30 NOTE — Assessment & Plan Note (Signed)
-   Resumed home levothyroxine 112 mcg daily ?

## 2021-10-30 NOTE — Assessment & Plan Note (Addendum)
-   Resumed home metoprolol succinate 12.5 mg p.o. twice daily and amlodipine 10 mg daily ?- Did not resume losartan due to AKI, am team to resume when appropriate ?- Patient has not taken her clonidine in one week, I did not resume this as well ?- Hydralazine 5 mg IV q6h prn for SBP > 170 ordered ?

## 2021-10-30 NOTE — Hospital Course (Signed)
[name] is a 66 year old female with history of insulin-dependent diabetes mellitus, peripheral vascular disease, with chronic recurrent bilateral lower extremity Ulcers, hypothyroid, hypertension, hyperlipidemia, CKD stage IIIa at baseline, presence of sacral wound, who presents emergency department from [facility] via EMS for chief concerns of intractable nausea and vomiting and abdominal pain. ? ?Initial vitals in the emergency department show temperature of 98.5, respiration rate of 12, and increased to 27, heart rate of 93, blood pressure 139/65, SPO2 of 97% on room air. ? ?Serum sodium is 132, potassium 4.2, chloride of 95, bicarb of 20, BUN of 19, serum creatinine of 1.53, GFR 37, nonfasting blood glucose 216, WBC 8.9, hemoglobin 11.5, platelets of 524. ? ?Anion gap was elevated at 17. ? ?Beta hydroxybutyrate was positive at 3.51. ? ?UA was positive for trace leukocytes. ? ?CT abdomen pelvis with contrast was ordered by EDP and read as no acute abdominal pelvic findings.  Cholelithiasis without CT evidence of acute cholecystitis.  Moderate volume of stool throughout the colon.  Prominence of the endometrial stripe, abnormal in the postmenopausal setting.  Nonemergent pelvic ultrasound is recommended. ? ?ED treatment: Morphine 2 mg IV, ondansetron 4 mg IV x2, sodium chloride 1 L bolus. ?

## 2021-10-30 NOTE — ED Notes (Signed)
Attempted PO challenge at 2000. Pt ate small bite of cracker and won't eat anything else. ?

## 2021-10-30 NOTE — Assessment & Plan Note (Addendum)
-   Presumed secondary to GI loss ?- Baseline CKD 3A ?- Symptomatic support with IV fluid and ondansetron 4 mg p.o. every 6 hours as needed for nausea, ondansetron 4 mg IV every 6 hours as needed for vomiting, Phenergan 12.5 mg IV every 6 hours as needed for refractory nausea and vomiting, 1 dose ordered ?- Repeat BMP in a.m. ?

## 2021-10-30 NOTE — ED Triage Notes (Signed)
Pt BIB EMS from [facility] for ABD pain that started a few days ago. Per EMS, pt has ben vomiting since yesterday morning. Pt c/o pain in RLQ and is tender on palpation.  ?

## 2021-10-30 NOTE — H&P (Signed)
?History and Physical  ? ?[name] MGQ:[id number] DOB: [birth year] DOA: 10/30/2021 ? ?PCP: Juline Patch, MD  ?Outpatient Specialists: Dr. Charlyne Quale clinic podiatry ?Patient coming from: [facility] via EMS ? ?I have personally briefly reviewed patient's old medical records in [place]. ? ?Chief Concern: Abdominal pain, nausea, vomiting ? ?HPI: [name] is a 66 year old female with history of insulin-dependent diabetes mellitus, peripheral vascular disease, with chronic recurrent bilateral lower extremity Ulcers, hypothyroid, hypertension, hyperlipidemia, CKD stage IIIa at baseline, presence of sacral wound, who presents emergency department from [facility] via EMS for chief concerns of intractable nausea and vomiting and abdominal pain. ? ?Initial vitals in the emergency department show temperature of 98.5, respiration rate of 12, and increased to 27, heart rate of 93, blood pressure 139/65, SPO2 of 97% on room air. ? ?Serum sodium is 132, potassium 4.2, chloride of 95, bicarb of 20, BUN of 19, serum creatinine of 1.53, GFR 37, nonfasting blood glucose 216, WBC 8.9, hemoglobin 11.5, platelets of 524. ? ?Anion gap was elevated at 17. ? ?Beta hydroxybutyrate was positive at 3.51. ? ?UA was positive for trace leukocytes. ? ?CT abdomen pelvis with contrast was ordered by EDP and read as no acute abdominal pelvic findings.  Cholelithiasis without CT evidence of acute cholecystitis.  Moderate volume of stool throughout the colon.  Prominence of the endometrial stripe, abnormal in the postmenopausal setting.  Nonemergent pelvic ultrasound is recommended. ? ?ED treatment: Morphine 2 mg IV, ondansetron 4 mg IV x2, sodium chloride 1 L bolus. ? ?At bedside she is able to tell me her name, age, and she knows the current year is 2023.  She has a flat affect. ? ?She started vomiting two days ago, she denies blood. She denies known sick contacts. She endorses not able to keep food down for two days. She  denies chest pain, shortness of breath, dysuria, hematuria, diarrhea.  ? ?She denies fever, cough. She states she is just nauseous and feels like she needs to vomit.  ? ?Social history: She is from assisted living, [place]. She denies history of tobacco, etoh, recreational drug use.  ? ?ROS: ?Constitutional: no weight change, no fever ?ENT/Mouth: no sore throat, no rhinorrhea ?Eyes: no eye pain, no vision changes ?Cardiovascular: no chest pain, no dyspnea,  no edema, no palpitations ?Respiratory: no cough, no sputum, no wheezing ?Gastrointestinal: + nausea, + vomiting, no diarrhea, no constipation ?Genitourinary: no urinary incontinence, no dysuria, no hematuria ?Musculoskeletal: no arthralgias, no myalgias ?Skin: no skin lesions, no pruritus, ?Neuro: + weakness, no loss of consciousness, no syncope ?Psych: no anxiety, no depression, + decrease appetite ?Heme/Lymph: no bruising, no bleeding ? ?ED Course: Discussed with emergency medicine provider, patient requiring hospitalization for chief concerns of intractable nausea and vomiting. ? ?Assessment/Plan ? ?Principal Problem: ?  DKA (diabetic ketoacidosis) ([place]) ?Active Problems: ?  Pressure ulcer ?  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 ([place]) ?  Essential hypertension ?  Hypothyroidism ?  Type 2 diabetes mellitus with hyperlipidemia ([place]) ?  Morbid obesity ([place]) ?  Acute kidney injury superimposed on CKD ([place]) ?  PVD (peripheral vascular disease) ([place]) ?  Stage 3a chronic kidney disease (CKD) ([place]) ?  ?Assessment and Plan: ? ?* DKA (diabetic ketoacidosis) ([place]) ?- Patient has insulin-dependent diabetes mellitus, positive for ketones, bicarb is mildly acidotic on CMP ?- Etiology work-up in progress, differentials include pneumonia versus gastroenteritis ?- Portable chest x-ray, procalcitonin ordered ?- EKG and high sensitive  troponin one-time lab order placed ?- UA showed trace leukocytes ?- Insulin via Endo tool initiated via  hyperglycemic order set ?- Status post sodium chloride 1 L bolus per EDP ?- Ordered additional LR 1 L bolus ?- Repeat BMP at 1 AM repeat on admission to assess for anion gap status ?- Admit to stepdown, inpatient ? ?Acute kidney injury superimposed on CKD ([place]) ?- Presumed secondary to GI loss ?- Baseline CKD 3A ?- Symptomatic support with IV fluid and ondansetron 4 mg p.o. every 6 hours as needed for nausea, ondansetron 4 mg IV every 6 hours as needed for vomiting, Phenergan 12.5 mg IV every 6 hours as needed for refractory nausea and vomiting, 1 dose ordered ?- Repeat BMP in a.m. ? ?Hypothyroidism ?- Resumed home levothyroxine 112 mcg daily ? ?Essential hypertension ?- Resumed home metoprolol succinate 12.5 mg p.o. twice daily and amlodipine 10 mg daily ?- Did not resume losartan due to AKI, am team to resume when appropriate ?- Patient has not taken her clonidine in one week, I did not resume this as well ?- Hydralazine 5 mg IV q6h prn for SBP > 170 ordered ? ?Chart reviewed.  ? ?DVT prophylaxis: Enoxaparin ?Code Status: full code ?Diet: N.p.o. ?Family Communication: no ?Disposition Plan: Pending clinical course ?Consults called: None at this time ?Admission status: Inpatient, stepdown ? ?Past Medical History:  ?Diagnosis Date  ? Diabetes mellitus without complication ([place]) 1610  ? Hypertension   ? Hypothyroidism   ? Kidney stone 11/2014  ? history of/STAGE 4 KIDNEY DISEASE PER DR Juleen China  ? Lymphedema   ? Multiple open wounds of lower leg   ? PT BEING SEEN BY THE WOUND CENTER FOR CHRONIC BLISTERS PER PT  ? ?Past Surgical History:  ?Procedure Laterality Date  ? AMPUTATION Right 05/03/2021  ? Procedure: AMPUTATION RAY;  Surgeon: Caroline More, DPM;  Location: [facility] ORS;  Service: Podiatry;  Laterality: Right;  RIght Big Toe and Right Fifth Toe  ? IRRIGATION AND DEBRIDEMENT FOOT Bilateral 05/03/2021  ? Procedure: IRRIGATION AND DEBRIDEMENT FEET;  Surgeon: Caroline More, DPM;  Location: [facility] ORS;  Service: Podiatry;   Laterality: Bilateral;  ? LOWER EXTREMITY ANGIOGRAPHY Right 04/27/2021  ? Procedure: Lower Extremity Angiography;  Surgeon: Katha Cabal, MD;  Location: [place] [facility];  Service: Cardiovascular;  Laterality: Right;  ? LOWER EXTREMITY ANGIOGRAPHY Left 04/30/2021  ? Procedure: Lower Extremity Angiography;  Surgeon: Algernon Huxley, MD;  Location: [place] [facility];  Service: Cardiovascular;  Laterality: Left;  ? TOTAL HIP ARTHROPLASTY Right 05/28/2015  ? Procedure: TOTAL HIP ARTHROPLASTY ANTERIOR APPROACH;  Surgeon: Hessie Knows, MD;  Location: [facility] ORS;  Service: Orthopedics;  Laterality: Right;  ? ?Social History:  reports that she has never smoked. She has never used smokeless tobacco. She reports that she does not drink alcohol and does not use drugs. ? ?No Known Allergies ?Family History  ?Problem Relation Age of Onset  ? Cancer Mother   ? Diabetes Father   ? ?Family history: Family history reviewed and not pertinent ? ?Prior to Admission medications   ?Medication Sig Start Date End Date Taking? Authorizing Provider  ?acetaminophen (TYLENOL) 325 MG tablet Take 2 tablets (650 mg total) by mouth every 6 (six) hours as needed for mild pain (or Fever >/= 101). 05/18/21  Yes Wieting, Richard, MD  ?amLODipine (NORVASC) 10 MG tablet Take 1 tablet (10 mg total) by mouth daily. 05/18/21  Yes Wieting, Richard, MD  ?blood glucose meter kit and supplies 1 each  by Other route daily. ONE TOUCH ULTRA METER. Use as directed. E11.29 10/23/20  Yes Jones, Deanna C, MD  ?Cholecalciferol (VITAMIN D3) 125 MCG (5000 UT) CAPS Take 1 capsule by mouth daily. 10/12/21  Yes [provider]  ?cloNIDine (CATAPRES) 0.3 MG tablet TAKE ONE TABLET BY MOUTH 3 TIMES DAILY. 10/22/20  Yes Jones, Deanna C, MD  ?clopidogrel (PLAVIX) 75 MG tablet Take 1 tablet (75 mg total) by mouth daily with breakfast. 05/19/21  Yes Wieting, Richard, MD  ?doxycycline (VIBRAMYCIN) 100 MG capsule Take 100 mg by mouth 2 (two) times daily. 10/21/21  Yes  [provider]  ?furosemide (LASIX) 20 MG tablet Take 1 tablet (20 mg total) by mouth 2 (two) times daily. 05/18/21  Yes Wieting, Richard, MD  ?glucose blood test strip 1 each by Other route dail

## 2021-10-30 NOTE — Assessment & Plan Note (Addendum)
-   Patient has insulin-dependent diabetes mellitus, positive for ketones, bicarb is mildly acidotic on CMP ?- Etiology work-up in progress, differentials include pneumonia versus gastroenteritis ?- Portable chest x-ray, procalcitonin ordered ?- EKG and high sensitive troponin one-time lab order placed ?- UA showed trace leukocytes ?- Insulin via Endo tool initiated via hyperglycemic order set ?- Status post sodium chloride 1 L bolus per EDP ?- Ordered additional LR 1 L bolus ?- Repeat BMP at 1 AM repeat on admission to assess for anion gap status ?- Admit to stepdown, inpatient ?

## 2021-10-31 DIAGNOSIS — I10 Essential (primary) hypertension: Secondary | ICD-10-CM | POA: Diagnosis not present

## 2021-10-31 DIAGNOSIS — E1169 Type 2 diabetes mellitus with other specified complication: Secondary | ICD-10-CM | POA: Diagnosis not present

## 2021-10-31 DIAGNOSIS — E1110 Type 2 diabetes mellitus with ketoacidosis without coma: Secondary | ICD-10-CM | POA: Diagnosis not present

## 2021-10-31 DIAGNOSIS — E785 Hyperlipidemia, unspecified: Secondary | ICD-10-CM | POA: Diagnosis not present

## 2021-10-31 LAB — PROCALCITONIN: Procalcitonin: <0.1 ng/mL

## 2021-10-31 LAB — BLOOD GAS, VENOUS
Acid-Base Excess: 1.5 mmol/L (ref 0.0–2.0)
Bicarbonate: 24.7 mmol/L (ref 20.0–28.0)
O2 Saturation: 73 %
Patient temperature: 37
pCO2, Ven: 34 mmHg — ABNORMAL LOW (ref 44–60)
pH, Ven: 7.47 — ABNORMAL HIGH (ref 7.25–7.43)
pO2, Ven: 42 mmHg (ref 32–45)

## 2021-10-31 LAB — BASIC METABOLIC PANEL
Anion gap: 11 (ref 5–15)
Anion gap: 16 — ABNORMAL HIGH (ref 5–15)
BUN: 18 mg/dL (ref 8–23)
BUN: 20 mg/dL (ref 8–23)
CO2: 21 mmol/L — ABNORMAL LOW (ref 22–32)
CO2: 23 mmol/L (ref 22–32)
Calcium: 8.7 mg/dL — ABNORMAL LOW (ref 8.9–10.3)
Calcium: 8.9 mg/dL (ref 8.9–10.3)
Chloride: 102 mmol/L (ref 98–111)
Chloride: 99 mmol/L (ref 98–111)
Creatinine, Ser: 1.26 mg/dL — ABNORMAL HIGH (ref 0.44–1.00)
Creatinine, Ser: 1.57 mg/dL — ABNORMAL HIGH (ref 0.44–1.00)
GFR, Estimated: 36 mL/min — ABNORMAL LOW (ref >60)
GFR, Estimated: 47 mL/min — ABNORMAL LOW (ref >60)
Glucose, Bld: 163 mg/dL — ABNORMAL HIGH (ref 70–99)
Glucose, Bld: 203 mg/dL — ABNORMAL HIGH (ref 70–99)
Potassium: 3.9 mmol/L (ref 3.5–5.1)
Potassium: 4.1 mmol/L (ref 3.5–5.1)
Sodium: 136 mmol/L (ref 135–145)
Sodium: 136 mmol/L (ref 135–145)

## 2021-10-31 LAB — HEMOGLOBIN A1C
Hgb A1c MFr Bld: 7.9 % — ABNORMAL HIGH (ref 4.8–5.6)
Mean Plasma Glucose: 180.03 mg/dL

## 2021-10-31 LAB — MAGNESIUM: Magnesium: 2.2 mg/dL (ref 1.7–2.4)

## 2021-10-31 LAB — TROPONIN I (HIGH SENSITIVITY): Troponin I (High Sensitivity): 34 ng/L — ABNORMAL HIGH (ref <18)

## 2021-10-31 LAB — GLUCOSE, CAPILLARY
Glucose-Capillary: 156 mg/dL — ABNORMAL HIGH (ref 70–99)
Glucose-Capillary: 142 mg/dL — ABNORMAL HIGH (ref 70–99)
Glucose-Capillary: 85 mg/dL (ref 70–99)
Glucose-Capillary: 92 mg/dL (ref 70–99)
Glucose-Capillary: 131 mg/dL — ABNORMAL HIGH (ref 70–99)
Glucose-Capillary: 137 mg/dL — ABNORMAL HIGH (ref 70–99)

## 2021-10-31 LAB — MRSA NEXT GEN BY PCR, NASAL: MRSA by PCR Next Gen: NOT DETECTED

## 2021-10-31 LAB — ETHANOL: Alcohol, Ethyl (B): <10 mg/dL (ref <10)

## 2021-10-31 MED ORDER — CHLORHEXIDINE GLUCONATE CLOTH 2 % EX PADS
6.0000 | MEDICATED_PAD | Freq: Every day | CUTANEOUS | Status: DC
  Administered 2021-11-01: 6 via TOPICAL

## 2021-10-31 MED ORDER — INSULIN ASPART 100 UNIT/ML IJ SOLN: 0.0000 [IU] | Freq: Every day | INTRAMUSCULAR | Status: DC

## 2021-10-31 MED ORDER — INSULIN GLARGINE-YFGN 100 UNIT/ML ~~LOC~~ SOLN
8.0000 [IU] | Freq: Every day | SUBCUTANEOUS | Status: DC
  Administered 2021-10-31 – 2021-11-02 (×4): 8 [IU] via SUBCUTANEOUS
  Filled 2021-10-31 (×5): qty 0.08

## 2021-10-31 MED ORDER — INSULIN ASPART 100 UNIT/ML IJ SOLN
0.0000 [IU] | Freq: Three times a day (TID) | INTRAMUSCULAR | Status: DC
  Administered 2021-10-31: 3 [IU] via SUBCUTANEOUS
  Administered 2021-11-01 (×2): 4 [IU] via SUBCUTANEOUS
  Administered 2021-11-03 (×2): 3 [IU] via SUBCUTANEOUS
  Filled 2021-10-31 (×5): qty 1

## 2021-10-31 MED ORDER — DEXTROSE IN LACTATED RINGERS 5 % IV SOLN: INTRAVENOUS | Status: DC

## 2021-10-31 NOTE — Progress Notes (Signed)
?PROGRESS NOTE ? ? ? ?[name]  HWE:[id number] DOB: 28-Dec-1955 DOA: 10/30/2021 ?PCP: Juline Patch, MD  ? ? ?Assessment & Plan: ?  ?Principal Problem: ?  DKA (diabetic ketoacidosis) ([name]) ?Active Problems: ?  Pressure ulcer ?  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 ([name]) ?  Essential hypertension ?  Hypothyroidism ?  Type 2 diabetes mellitus with hyperlipidemia ([name]) ?  Morbid obesity ([name]) ?  Acute kidney injury superimposed on CKD ([name]) ?  PVD (peripheral vascular disease) ([name]) ?  Stage 3a chronic kidney disease (CKD) ([place] [name]) ? ?Assessment and Plan: ? ? ?DKA: w/ hx of likely poorly controlled DM2. Anion gap is closed. Insulin drip was d/c and continue on glargine, SSI w/ accuchecks  ? ?DM2: poorly controlled. Continue on glargine, SSI w/ accuchecks ?  ?AKI on CKDIIIa: Cr is labile. Avoid nephrotoxic meds  ?  ?Hypothyroidism: continue on home dose of levothyroxine  ?  ?HTN: continue on metoprolol, amlodipine. Hold losartan, clonidine  ? ?Obesity: BMI 31.9. Would benefit from weight loss  ? ? ?DVT prophylaxis: lovenox  ?Code Status: full  ?Family Communication:  ?Disposition Plan: depends on PT/OT recs  ? ?Level of care: Stepdown ? ?Status is: Inpatient ?Remains inpatient appropriate because: severity of illness ? ? ?Consultants:  ? ? ?Procedures:  ? ?Antimicrobials: ? ?Subjective: ?Pt c/o nausea ? ?Objective: ?Vitals:  ? 10/31/21 0410 10/31/21 0425 10/31/21 0600 10/31/21 0700  ?BP:   (!) 150/71 (!) 145/74  ?Pulse: (!) 113 (!) 108 (!) 109 (!) 116  ?Resp: 14 11 16  (!) 21  ?Temp:      ?TempSrc:      ?SpO2: 99% 98% 99% 99%  ?Weight:      ?Height:      ? ? ?Intake/Output Summary (Last 24 hours) at 10/31/2021 0809 ?Last data filed at 10/31/2021 0500 ?Gross per 24 hour  ?Intake 425 ml  ?Output 400 ml  ?Net 25 ml  ? ?Filed Weights  ? 10/30/21 1405 10/31/21 0000  ?Weight: 90.7 kg 87.1 kg  ? ? ?Examination: ? ?General exam: Appears calm but uncomfortable  ?Respiratory system: Clear to  auscultation. Respiratory effort normal. ?Cardiovascular system: S1 & S2 + No  rubs, gallops or clicks.  ?Gastrointestinal system: Abdomen is nondistended, soft and nontender. Normal bowel sounds heard. ?Central nervous system: Alert and awake. Moves all extremities  ?Psychiatry: Judgement and insight appear at baseline. Flat mood and affect ? ? ? ?Data Reviewed: I have personally reviewed following labs and imaging studies ? ?CBC: ?Recent Labs  ?Lab 10/30/21 ?1426  ?WBC 8.9  ?HGB 11.5*  ?HCT 36.8  ?MCV 79.5*  ?PLT 524*  ? ?Basic Metabolic Panel: ?Recent Labs  ?Lab 10/30/21 ?1426 10/30/21 ?2336 10/30/21 ?2350 10/31/21 ?0436  ?NA 132*  --  136 136  ?K 4.2  --  4.1 3.9  ?CL 95*  --  99 102  ?CO2 20*  --  21* 23  ?GLUCOSE 214*  --  203* 163*  ?BUN 19  --  20 18  ?CREATININE 1.53*  --  1.57* 1.26*  ?CALCIUM 9.1  --  8.9 8.7*  ?MG  --  2.2  --   --   ? ?GFR: ?Estimated Creatinine Clearance: 46.5 mL/min (A) (by C-G formula based on SCr of 1.26 mg/dL (H)). ?Liver Function Tests: ?Recent Labs  ?Lab 10/30/21 ?1426  ?AST 21  ?ALT 8  ?ALKPHOS 86  ?BILITOT 1.0  ?PROT 7.9  ?ALBUMIN 2.7*  ? ?Recent Labs  ?Lab  10/30/21 ?1426  ?LIPASE 29  ? ?No results for input(s): AMMONIA in the last 168 hours. ?Coagulation Profile: ?No results for input(s): INR, PROTIME in the last 168 hours. ?Cardiac Enzymes: ?No results for input(s): CKTOTAL, CKMB, CKMBINDEX, TROPONINI in the last 168 hours. ?BNP (last 3 results) ?No results for input(s): PROBNP in the last 8760 hours. ?HbA1C: ?No results for input(s): HGBA1C in the last 72 hours. ?CBG: ?Recent Labs  ?Lab 10/30/21 ?2300 10/30/21 ?2332 10/31/21 ?0402 10/31/21 ?2947  ?GLUCAP 195* 189* 156* 142*  ? ?Lipid Profile: ?No results for input(s): CHOL, HDL, LDLCALC, TRIG, CHOLHDL, LDLDIRECT in the last 72 hours. ?Thyroid Function Tests: ?No results for input(s): TSH, T4TOTAL, FREET4, T3FREE, THYROIDAB in the last 72 hours. ?Anemia Panel: ?No results for input(s): VITAMINB12, FOLATE, FERRITIN, TIBC,  IRON, RETICCTPCT in the last 72 hours. ?Sepsis Labs: ?Recent Labs  ?Lab 10/30/21 ?2336  ?PROCALCITON <0.10  ? ? ?Recent Results (from the past 240 hour(s))  ?MRSA Next Gen by PCR, Nasal     Status: None  ? Collection Time: 10/30/21 11:41 PM  ? Specimen: Nasal Mucosa; Nasal Swab  ?Result Value Ref Range Status  ? MRSA by PCR Next Gen NOT DETECTED NOT DETECTED Final  ?  Comment: (NOTE) ?The GeneXpert MRSA Assay (FDA approved for NASAL specimens only), ?is one component of a comprehensive MRSA colonization surveillance ?program. It is not intended to diagnose MRSA infection nor to guide ?or monitor treatment for MRSA infections. ?Test performance is not FDA approved in patients less than 2 years ?old. ?Performed at [facility], [place], ?[place] [address] ?  ?  ? ? ? ? ? ?Radiology Studies: ?CT ABDOMEN PELVIS W CONTRAST ? ?Result Date: 10/30/2021 ?CLINICAL DATA:  Abdominal pain, acute, nonlocalized EXAM: CT ABDOMEN AND PELVIS WITH CONTRAST TECHNIQUE: Multidetector CT imaging of the abdomen and pelvis was performed using the standard protocol following bolus administration of intravenous contrast. RADIATION DOSE REDUCTION: This exam was performed according to the departmental dose-optimization program which includes automated exposure control, adjustment of the mA and/or kV according to patient size and/or use of iterative reconstruction technique. CONTRAST:  50mL OMNIPAQUE IOHEXOL 300 MG/ML  SOLN COMPARISON:  05/08/2015 FINDINGS: Lower chest: Included lung bases are clear. Heart size is normal. Coronary artery atherosclerosis. Hepatobiliary: No focal liver abnormality is seen. Multiple stones within the dependent portion of the gallbladder. No evidence of gallbladder wall thickening or pericholecystic inflammatory changes by CT. No biliary dilatation. Pancreas: Unremarkable. No pancreatic ductal dilatation or surrounding inflammatory changes. Spleen: Normal in size without focal abnormality.  Adrenals/Urinary Tract: Unremarkable adrenal glands. 2.9 cm cyst within the interpolar region of the right kidney. Additional 1.0 cm lower pole right renal cyst. No follow-up imaging required. Kidneys enhance symmetrically. No renal stone or hydronephrosis. Urinary bladder is partially obscured by artifact from patient's hip prosthesis. Bladder appears otherwise unremarkable. Stomach/Bowel: Stomach within normal limits. No dilated loops of bowel. Normal appendix in the right lower quadrant (series 2, image 64). Moderate volume of stool throughout the colon. No focal bowel wall thickening or inflammatory changes. Vascular/Lymphatic: Aortic atherosclerosis. No enlarged abdominal or pelvic lymph nodes. Reproductive: Anteverted uterus. Mild prominence of the endometrial stripe. No adnexal masses. Other: No free fluid. No abdominopelvic fluid collection. No pneumoperitoneum. No abdominal wall hernia. Musculoskeletal: No acute osseous findings. Advanced degenerative disc disease within the lumbar spine. Right total hip arthroplasty. Advanced osteoarthritis of the left hip. Generalized muscle atrophy. IMPRESSION: 1. No acute abdominopelvic findings. 2. Cholelithiasis without CT  evidence of acute cholecystitis. 3. Moderate volume of stool throughout the colon. 4. Prominence of the endometrial stripe, abnormal in the postmenopausal setting. Follow-up non-emergent pelvic ultrasound is recommended. Aortic Atherosclerosis (ICD10-I70.0). Electronically Signed   By: Davina Poke D.O.   On: 10/30/2021 15:49  ? ?Portable chest x-ray (1 view) ? ?Result Date: 10/30/2021 ?CLINICAL DATA:  Diabetic ketoacidosis. EXAM: PORTABLE CHEST 1 VIEW COMPARISON:  None Available. FINDINGS: The heart size and mediastinal contours are within normal limits. There are calcifications in the aortic arch. Both lungs are clear. The visualized skeletal structures are unremarkable. IMPRESSION: No evidence of acute chest disease.  Aortic atherosclerosis.  Electronically Signed   By: Telford Nab M.D.   On: 10/30/2021 23:37   ? ? ? ? ? ?Scheduled Meds: ? amLODipine  10 mg Oral Daily  ? atorvastatin  20 mg Oral Daily  ? Chlorhexidine Gluconate Cloth  6 each Top

## 2021-10-31 NOTE — Plan of Care (Signed)
  Problem: Nutrition Goal: Nutritional status is improving Description: Monitor and assess patient for malnutrition (ex- brittle hair, bruises, dry skin, pale skin and conjunctiva, muscle wasting, smooth red tongue, and disorientation). Collaborate with interdisciplinary team and initiate plan and interventions as ordered.  Monitor patient's weight and dietary intake as ordered or per policy. Utilize nutrition screening tool and intervene per policy. Determine patient's food preferences and provide high-protein, high-caloric foods as appropriate.  Outcome: Progressing   Problem: Education: Goal: Knowledge of General Education information will improve Description: Including pain rating scale, medication(s)/side effects and non-pharmacologic comfort measures Outcome: Progressing   Problem: Health Behavior/Discharge Planning: Goal: Ability to manage health-related needs will improve Outcome: Progressing   Problem: Clinical Measurements: Goal: Ability to maintain clinical measurements within normal limits will improve Outcome: Progressing Goal: Will remain free from infection Outcome: Progressing Goal: Diagnostic test results will improve Outcome: Progressing Goal: Respiratory complications will improve Outcome: Progressing Goal: Cardiovascular complication will be avoided Outcome: Progressing   Problem: Activity: Goal: Risk for activity intolerance will decrease Outcome: Progressing   Problem: Nutrition: Goal: Adequate nutrition will be maintained Outcome: Progressing   Problem: Coping: Goal: Level of anxiety will decrease Outcome: Progressing   Problem: Elimination: Goal: Will not experience complications related to bowel motility Outcome: Progressing Goal: Will not experience complications related to urinary retention Outcome: Progressing   Problem: Pain Managment: Goal: General experience of comfort will improve Outcome: Progressing   Problem: Safety: Goal: Ability to  remain free from injury will improve Outcome: Progressing   Problem: Skin Integrity: Goal: Risk for impaired skin integrity will decrease Outcome: Progressing   

## 2021-10-31 NOTE — Progress Notes (Signed)
0800 patient complaining of patient to left lower leg patient unable to describe pain patient alert x4 able to make all needs known patient refusing reposition even after education on her wounds and the needs for repositioning. Patient refusing to wear off loading boot education on her calf pressure given and the importance of the off loading boot to her heel and calf. Pt still refuses to wear it ?1000 patient again refusing to turn and insisted that she wanted left boot off. ?

## 2021-10-31 NOTE — Progress Notes (Signed)
Notified DO Cox of pt having a burst of svt via rapid connect. Awaiting orders. ?

## 2021-10-31 NOTE — Consult Note (Addendum)
WOC Nurse Consult Note: ?Reason for Consult:Chronic, nonhealing bilateral LE wounds and newer onset sacral Stage 3 pressure injury. Patient followed closely (monthly) for several years at the outpatient wound care center at [facility]. Last seen on Friday, 10/29/21 by Provider Jeri Cos, III PA-C who measured all wounds and outlined the POC for all wounds. See note from that Encounter. I will continue Mr. Joaquim Lai, III's POC while in house. He notes that the patient unfortunately does not use her lymphedema pumps and has been spending an increased amount of time in the supine position at her care facility. His note reflects the importance of turning and repositioning to minimize time in the supine position.I will continue that while in house although it is our protocol to turn and reposition routinely. ?Wound type: Pressure plus venous insufficiency ?Pressure Injury POA: Yes ?Measurement:Per the outpatient wound care center on Friday, 10/29/21: ?Wound # 60: Right posterior LE:  12cm x 6cm x 0.1cm ?Wound # 61: Left posterior LE: 9.4cm x 3cm x 0.3cm ?Wound #62: Left midline posterior LE: 8cm x 2.4cm x 0.2cm ?Wound # 63: Left calcaneus: 1cm x 1cm x 0.1cm ?Wound # 65: Right lateral foot: 0.1cm x 0.1cm x 0.1cm ?Wound # 64: Sacrum: 3cm x 0.3cm x 0.1cm Stage 3  ? ?Wound bed: Full thickness wounds, various shades of red with some nonviable tissue. Please see notes from outpatient [facility] for wounds #60-#66 ?Drainage (amount, consistency, odor) serous from sacral PI, serous to serosanguinous from LEs (Please see noted from outpatient [facility]) ?Periwound: intact, with evidence of previous wound healing, intact ?Dressing procedure/placement/frequency: ?The POC in place has been effective and [facility] Provider indicates that continuation is advised.  We will use xeroform to the LEs (antimicrobial nonadherent) topped with ABD pads and secured with Kerlix roll gauze from just below toes to just below knees, paper tape. The Kerlix roll gauze is to be  topped with ACE bandage applied in a similar manner. Feet are to be placed into Prevalon boots. ?It is an essential element of her POC to be turned and repositioning from side to side while in bed to minimize time in the supine position. She is currently in the ICU on an mattress replacement with low air loss feature; I will continue this when she goes to the floor. ? ?[place] nursing team will not follow, but will remain available to this patient, the nursing and medical teams.  Please re-consult if needed. ?Thanks, ?Maudie Flakes, MSN, RN, [place], [place], CWON-AP, [place]  ?Pager# [phone]  ? ? ?  ?

## 2021-10-31 NOTE — Progress Notes (Signed)
Pt arrived on floor via stretcher. Pt transferred to ICU bed and placed on all monitors. Bilat LE have noted venous stasis ulcers. Removed dressings patient came up with and redressed after assessment. Sacrum and back also have wounds. Pt fsbs is 185. Notified MD cox that labs had not been recollected since 1415 and informed her of patient fsbs.. Insulin gtt has not been initiated. Informed that new labs were ordered and would be drawn and to await results prior to deciding to start insulin drip. Pt is receiving 1L LR bolus currently. VSS. Resp even and unlabored on RA. 20 int est to left ac with good blood return noted. Pt bed placed in low,locked position. SR up x3. Call light in reach.  ?

## 2021-11-01 DIAGNOSIS — N1831 Chronic kidney disease, stage 3a: Secondary | ICD-10-CM | POA: Diagnosis not present

## 2021-11-01 DIAGNOSIS — N189 Chronic kidney disease, unspecified: Secondary | ICD-10-CM | POA: Diagnosis not present

## 2021-11-01 DIAGNOSIS — E1169 Type 2 diabetes mellitus with other specified complication: Secondary | ICD-10-CM | POA: Diagnosis not present

## 2021-11-01 DIAGNOSIS — N179 Acute kidney failure, unspecified: Secondary | ICD-10-CM | POA: Diagnosis not present

## 2021-11-01 LAB — CBC
HCT: 34.3 % — ABNORMAL LOW (ref 36.0–46.0)
Hemoglobin: 10.6 g/dL — ABNORMAL LOW (ref 12.0–15.0)
MCH: 24.8 pg — ABNORMAL LOW (ref 26.0–34.0)
MCHC: 30.9 g/dL (ref 30.0–36.0)
MCV: 80.3 fL (ref 80.0–100.0)
Platelets: 440 10*3/uL — ABNORMAL HIGH (ref 150–400)
RBC: 4.27 MIL/uL (ref 3.87–5.11)
RDW: 17.2 % — ABNORMAL HIGH (ref 11.5–15.5)
WBC: 8.7 10*3/uL (ref 4.0–10.5)
nRBC: 0 % (ref 0.0–0.2)

## 2021-11-01 LAB — BASIC METABOLIC PANEL
Anion gap: 8 (ref 5–15)
BUN: 11 mg/dL (ref 8–23)
CO2: 24 mmol/L (ref 22–32)
Calcium: 8.6 mg/dL — ABNORMAL LOW (ref 8.9–10.3)
Chloride: 104 mmol/L (ref 98–111)
Creatinine, Ser: 1.27 mg/dL — ABNORMAL HIGH (ref 0.44–1.00)
GFR, Estimated: 46 mL/min — ABNORMAL LOW (ref >60)
Glucose, Bld: 126 mg/dL — ABNORMAL HIGH (ref 70–99)
Potassium: 3.6 mmol/L (ref 3.5–5.1)
Sodium: 136 mmol/L (ref 135–145)

## 2021-11-01 LAB — GLUCOSE, CAPILLARY
Glucose-Capillary: 144 mg/dL — ABNORMAL HIGH (ref 70–99)
Glucose-Capillary: 158 mg/dL — ABNORMAL HIGH (ref 70–99)
Glucose-Capillary: 73 mg/dL (ref 70–99)
Glucose-Capillary: 194 mg/dL — ABNORMAL HIGH (ref 70–99)
Glucose-Capillary: 150 mg/dL — ABNORMAL HIGH (ref 70–99)

## 2021-11-01 MED ORDER — SCOPOLAMINE 1 MG/3DAYS TD PT72
1.0000 | MEDICATED_PATCH | TRANSDERMAL | Status: DC
  Administered 2021-11-01: 1.5 mg via TRANSDERMAL
  Filled 2021-11-01: qty 1

## 2021-11-01 MED ORDER — CLONIDINE HCL 0.1 MG PO TABS
0.3000 mg | ORAL_TABLET | Freq: Three times a day (TID) | ORAL | Status: DC
Start: 2021-11-01 — End: 2021-11-03
  Administered 2021-11-01 – 2021-11-03 (×4): 0.3 mg via ORAL
  Filled 2021-11-01 (×5): qty 3

## 2021-11-01 MED ORDER — PROCHLORPERAZINE EDISYLATE 10 MG/2ML IJ SOLN
10.0000 mg | Freq: Four times a day (QID) | INTRAMUSCULAR | Status: DC | PRN
  Filled 2021-11-01: qty 2

## 2021-11-01 NOTE — Progress Notes (Signed)
PT Cancellation Note ? ?Patient Details ?Name: [name] ?MRN: [id number] ?DOB: [birth year] ? ? ?Cancelled Treatment:    Reason Eval/Treat Not Completed: Other (comment).  PT consult received.  Chart reviewed.  Pt reporting feeling nauseas and not feeling well in general (pt's nurse notified)--pt declined PT evaluation. ? ?Leitha Bleak, PT ?11/01/21, 9:21 AM ? ?

## 2021-11-01 NOTE — Progress Notes (Signed)
OT Cancellation Note ? ?Patient Details ?Name: [name] ?MRN: [id number] ?DOB: February 06, 1956 ? ? ?Cancelled Treatment:    Reason Eval/Treat Not Completed: OT screened, no needs identified, will sign off. OT order received and chart reviewed. Upon entering the pt requests assistance with pillows. She performs long sitting in bed without physical assistance. Pt reports use of hoyer lift for functional transfers, self care performed from bed level with pt able to assist with UB only, and use of disposable brief for toileting needs. Pt is in LTC facility and is at baseline level of function. OT to SIGN OFF at this time.  ? ?Darleen Crocker, MS, OTR/L , CBIS ?ascom [phone]  ?11/01/21, 12:50 PM  ?

## 2021-11-01 NOTE — Progress Notes (Signed)
11/01/2021 at 0420: ? ?Pt refused wound care dressing change to stage 3 on sacrum. This RN educated pt on the importance of consistency in wound care and correlation with healing. This RN stated, "if you change your mind about your dressing change please call me." Pt continued to refuse. Pt is resting and call bell within reach.  ?

## 2021-11-01 NOTE — Progress Notes (Signed)
Pt refused dressing changes and turning and repositioning today multiple times. Education provided will continue to monitor ?

## 2021-11-01 NOTE — Progress Notes (Addendum)
Inpatient Diabetes Program Recommendations ? ?AACE/ADA: New Consensus Statement on Inpatient Glycemic Control (2015) ? ?Target Ranges:  Prepandial:   less than 140 mg/dL ?     Peak postprandial:   less than 180 mg/dL (1-2 hours) ?     Critically ill patients:  140 - 180 mg/dL  ? ? Latest Reference Range & Units 10/31/21 07:26 10/31/21 11:48 10/31/21 16:57 10/31/21 20:36  ?Glucose-Capillary 70 - 99 mg/dL 142 (H) ? ?3 units Novolog ? 85 92 131 (H) ? ?8 units Semglee  ?(H): Data is abnormally high ? Latest Reference Range & Units 10/31/21 23:27 11/01/21 04:01 11/01/21 09:17 11/01/21 12:13  ?Glucose-Capillary 70 - 99 mg/dL 137 (H) 144 (H) 158 (H) ? ?4 units Novolog ? 73  ?(H): Data is abnormally high ? ? ? ? ?Home DM Meds: Lantus 8 units QHS ?       Novolog 3 units TID with meals ? ?Current Orders: Semglee 8 units QHS ?     Novolog Resistant Correction Scale/ SSI (0-20 units) TID AC + HS ? ? ? ? ?MD- Note significant drop in CBGs after getting Novolog SSI. ? ?Please consider reducing the Novolog SSI to the 0-15 unit (Moderate) scale ? ? ? ?--Will follow patient during hospitalization-- ? ?Wyn Quaker RN, MSN, CDE ?Diabetes Coordinator ?Inpatient Glycemic Control Team ?Team Pager: [phone] (8a-5p) ? ?

## 2021-11-01 NOTE — Progress Notes (Signed)
?PROGRESS NOTE ? ? ? ?[name]  AYT:[id number] DOB: 04/01/1956 DOA: 10/30/2021 ?PCP: Juline Patch, MD  ? ? ?Assessment & Plan: ?  ?Principal Problem: ?  DKA (diabetic ketoacidosis) ([place]) ?Active Problems: ?  Pressure ulcer ?  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 ([place]) ?  Essential hypertension ?  Hypothyroidism ?  Type 2 diabetes mellitus with hyperlipidemia ([place]) ?  Morbid obesity ([place]) ?  Acute kidney injury superimposed on CKD ([place]) ?  PVD (peripheral vascular disease) ([place]) ?  Stage 3a chronic kidney disease (CKD) ([place]) ? ?Assessment and Plan: ? ? ?DKA: w/ hx of likely poorly controlled DM2. Anion gap is closed. Resolved  ? ?DM2: poorly controlled, HbA1c 7.9. Continue on glargine, SSI w/ accuchecks. Nauseous still, zofran, compazine prn & scopolamine patch ordered  ?  ?AKI on CKDIIIa: Cr is labile. Avoid nephrotoxic meds   ?  ?Hypothyroidism: continue on home dose of levothyroxine  ?  ?HTN: continue on amlodipine, metoprolol, clonidine. Hold losartan   ? ?Obesity: BMI 31.9. Would benefit from weight loss  ? ? ?DVT prophylaxis: lovenox  ?Code Status: full  ?Family Communication:  ?Disposition Plan: depends on PT/OT recs  ? ?Level of care: Med-Surg ? ?Status is: Inpatient ?Remains inpatient appropriate because: severity of illness ? ? ?Consultants:  ? ? ?Procedures:  ? ?Antimicrobials: ? ?Subjective: ?Pt c/o nausea  ? ?Objective: ?Vitals:  ? 10/31/21 1657 10/31/21 1953 11/01/21 0004 11/01/21 0325  ?BP: 137/67 (!) 137/59 133/69 138/71  ?Pulse: (!) 104 (!) 109 (!) 103 (!) 101  ?Resp: 18 16 18 16   ?Temp: 98.1 ?F (36.7 ?C) 98.2 ?F (36.8 ?C) 97.6 ?F (36.4 ?C) 98.2 ?F (36.8 ?C)  ?TempSrc:  Oral Axillary Axillary  ?SpO2: 99% 98% 100% 96%  ?Weight:      ?Height:      ? ? ?Intake/Output Summary (Last 24 hours) at 11/01/2021 0733 ?Last data filed at 11/01/2021 0357 ?Gross per 24 hour  ?Intake 2312.86 ml  ?Output 550 ml  ?Net 1762.86 ml  ? ?Filed Weights  ? 10/30/21 1405 10/31/21  0000  ?Weight: 90.7 kg 87.1 kg  ? ? ?Examination: ? ?General exam: Appears uncomfortable  ?Respiratory system: clear breath sounds b/l  ?Cardiovascular system: S1/S2+. No rubs or clicks   ?Gastrointestinal system: Abd is soft, NT, obese & hypoactive bowel sounds  ?Central nervous system: alert and awake. Moves all extremities  ?Psychiatry: Judgement and insight appear at baseline. Flat mood and affect  ? ? ? ?Data Reviewed: I have personally reviewed following labs and imaging studies ? ?CBC: ?Recent Labs  ?Lab 10/30/21 ?1426 11/01/21 ?0415  ?WBC 8.9 8.7  ?HGB 11.5* 10.6*  ?HCT 36.8 34.3*  ?MCV 79.5* 80.3  ?PLT 524* 440*  ? ?Basic Metabolic Panel: ?Recent Labs  ?Lab 10/30/21 ?1426 10/30/21 ?2336 10/30/21 ?2350 10/31/21 ?3557 11/01/21 ?0415  ?NA 132*  --  136 136 136  ?K 4.2  --  4.1 3.9 3.6  ?CL 95*  --  99 102 104  ?CO2 20*  --  21* 23 24  ?GLUCOSE 214*  --  203* 163* 126*  ?BUN 19  --  20 18 11   ?CREATININE 1.53*  --  1.57* 1.26* 1.27*  ?CALCIUM 9.1  --  8.9 8.7* 8.6*  ?MG  --  2.2  --   --   --   ? ?GFR: ?Estimated Creatinine Clearance: 46.2 mL/min (A) (by C-G formula based on SCr of 1.27 mg/dL (H)). ?Liver Function Tests: ?Recent  Labs  ?Lab 10/30/21 ?1426  ?AST 21  ?ALT 8  ?ALKPHOS 86  ?BILITOT 1.0  ?PROT 7.9  ?ALBUMIN 2.7*  ? ?Recent Labs  ?Lab 10/30/21 ?1426  ?LIPASE 29  ? ?No results for input(s): AMMONIA in the last 168 hours. ?Coagulation Profile: ?No results for input(s): INR, PROTIME in the last 168 hours. ?Cardiac Enzymes: ?No results for input(s): CKTOTAL, CKMB, CKMBINDEX, TROPONINI in the last 168 hours. ?BNP (last 3 results) ?No results for input(s): PROBNP in the last 8760 hours. ?HbA1C: ?Recent Labs  ?  10/31/21 ?0436  ?HGBA1C 7.9*  ? ?CBG: ?Recent Labs  ?Lab 10/31/21 ?1148 10/31/21 ?1657 10/31/21 ?2036 10/31/21 ?2327 11/01/21 ?0401  ?GLUCAP 85 92 131* 137* 144*  ? ?Lipid Profile: ?No results for input(s): CHOL, HDL, LDLCALC, TRIG, CHOLHDL, LDLDIRECT in the last 72 hours. ?Thyroid Function  Tests: ?No results for input(s): TSH, T4TOTAL, FREET4, T3FREE, THYROIDAB in the last 72 hours. ?Anemia Panel: ?No results for input(s): VITAMINB12, FOLATE, FERRITIN, TIBC, IRON, RETICCTPCT in the last 72 hours. ?Sepsis Labs: ?Recent Labs  ?Lab 10/30/21 ?2336  ?PROCALCITON <0.10  ? ? ?Recent Results (from the past 240 hour(s))  ?MRSA Next Gen by PCR, Nasal     Status: None  ? Collection Time: 10/30/21 11:41 PM  ? Specimen: Nasal Mucosa; Nasal Swab  ?Result Value Ref Range Status  ? MRSA by PCR Next Gen NOT DETECTED NOT DETECTED Final  ?  Comment: (NOTE) ?The GeneXpert MRSA Assay (FDA approved for NASAL specimens only), ?is one component of a comprehensive MRSA colonization surveillance ?program. It is not intended to diagnose MRSA infection nor to guide ?or monitor treatment for MRSA infections. ?Test performance is not FDA approved in patients less than 2 years ?old. ?Performed at [facility], [place], ?[place] [address] ?  ?  ? ? ? ? ? ?Radiology Studies: ?CT ABDOMEN PELVIS W CONTRAST ? ?Result Date: 10/30/2021 ?CLINICAL DATA:  Abdominal pain, acute, nonlocalized EXAM: CT ABDOMEN AND PELVIS WITH CONTRAST TECHNIQUE: Multidetector CT imaging of the abdomen and pelvis was performed using the standard protocol following bolus administration of intravenous contrast. RADIATION DOSE REDUCTION: This exam was performed according to the departmental dose-optimization program which includes automated exposure control, adjustment of the mA and/or kV according to patient size and/or use of iterative reconstruction technique. CONTRAST:  17mL OMNIPAQUE IOHEXOL 300 MG/ML  SOLN COMPARISON:  05/08/2015 FINDINGS: Lower chest: Included lung bases are clear. Heart size is normal. Coronary artery atherosclerosis. Hepatobiliary: No focal liver abnormality is seen. Multiple stones within the dependent portion of the gallbladder. No evidence of gallbladder wall thickening or pericholecystic inflammatory changes by  CT. No biliary dilatation. Pancreas: Unremarkable. No pancreatic ductal dilatation or surrounding inflammatory changes. Spleen: Normal in size without focal abnormality. Adrenals/Urinary Tract: Unremarkable adrenal glands. 2.9 cm cyst within the interpolar region of the right kidney. Additional 1.0 cm lower pole right renal cyst. No follow-up imaging required. Kidneys enhance symmetrically. No renal stone or hydronephrosis. Urinary bladder is partially obscured by artifact from patient's hip prosthesis. Bladder appears otherwise unremarkable. Stomach/Bowel: Stomach within normal limits. No dilated loops of bowel. Normal appendix in the right lower quadrant (series 2, image 64). Moderate volume of stool throughout the colon. No focal bowel wall thickening or inflammatory changes. Vascular/Lymphatic: Aortic atherosclerosis. No enlarged abdominal or pelvic lymph nodes. Reproductive: Anteverted uterus. Mild prominence of the endometrial stripe. No adnexal masses. Other: No free fluid. No abdominopelvic fluid collection. No pneumoperitoneum. No abdominal wall hernia. Musculoskeletal: No  acute osseous findings. Advanced degenerative disc disease within the lumbar spine. Right total hip arthroplasty. Advanced osteoarthritis of the left hip. Generalized muscle atrophy. IMPRESSION: 1. No acute abdominopelvic findings. 2. Cholelithiasis without CT evidence of acute cholecystitis. 3. Moderate volume of stool throughout the colon. 4. Prominence of the endometrial stripe, abnormal in the postmenopausal setting. Follow-up non-emergent pelvic ultrasound is recommended. Aortic Atherosclerosis (ICD10-I70.0). Electronically Signed   By: Davina Poke D.O.   On: 10/30/2021 15:49  ? ?Portable chest x-ray (1 view) ? ?Result Date: 10/30/2021 ?CLINICAL DATA:  Diabetic ketoacidosis. EXAM: PORTABLE CHEST 1 VIEW COMPARISON:  None Available. FINDINGS: The heart size and mediastinal contours are within normal limits. There are calcifications  in the aortic arch. Both lungs are clear. The visualized skeletal structures are unremarkable. IMPRESSION: No evidence of acute chest disease.  Aortic atherosclerosis. Electronically Signed   By: Telford Nab

## 2021-11-02 DIAGNOSIS — N179 Acute kidney failure, unspecified: Secondary | ICD-10-CM | POA: Diagnosis not present

## 2021-11-02 DIAGNOSIS — N1831 Chronic kidney disease, stage 3a: Secondary | ICD-10-CM | POA: Diagnosis not present

## 2021-11-02 DIAGNOSIS — E1169 Type 2 diabetes mellitus with other specified complication: Secondary | ICD-10-CM | POA: Diagnosis not present

## 2021-11-02 DIAGNOSIS — N189 Chronic kidney disease, unspecified: Secondary | ICD-10-CM | POA: Diagnosis not present

## 2021-11-02 LAB — CBC
HCT: 31.6 % — ABNORMAL LOW (ref 36.0–46.0)
Hemoglobin: 9.7 g/dL — ABNORMAL LOW (ref 12.0–15.0)
MCH: 25.1 pg — ABNORMAL LOW (ref 26.0–34.0)
MCHC: 30.7 g/dL (ref 30.0–36.0)
MCV: 81.9 fL (ref 80.0–100.0)
Platelets: 357 10*3/uL (ref 150–400)
RBC: 3.86 MIL/uL — ABNORMAL LOW (ref 3.87–5.11)
RDW: 17.3 % — ABNORMAL HIGH (ref 11.5–15.5)
WBC: 6.6 10*3/uL (ref 4.0–10.5)
nRBC: 0 % (ref 0.0–0.2)

## 2021-11-02 LAB — BASIC METABOLIC PANEL
Anion gap: 7 (ref 5–15)
BUN: 10 mg/dL (ref 8–23)
CO2: 28 mmol/L (ref 22–32)
Calcium: 8.6 mg/dL — ABNORMAL LOW (ref 8.9–10.3)
Chloride: 103 mmol/L (ref 98–111)
Creatinine, Ser: 1.15 mg/dL — ABNORMAL HIGH (ref 0.44–1.00)
GFR, Estimated: 52 mL/min — ABNORMAL LOW (ref >60)
Glucose, Bld: 96 mg/dL (ref 70–99)
Potassium: 3.5 mmol/L (ref 3.5–5.1)
Sodium: 138 mmol/L (ref 135–145)

## 2021-11-02 LAB — URINE CULTURE: Culture: 60000 — AB

## 2021-11-02 LAB — GLUCOSE, CAPILLARY
Glucose-Capillary: 106 mg/dL — ABNORMAL HIGH (ref 70–99)
Glucose-Capillary: 108 mg/dL — ABNORMAL HIGH (ref 70–99)
Glucose-Capillary: 131 mg/dL — ABNORMAL HIGH (ref 70–99)
Glucose-Capillary: 170 mg/dL — ABNORMAL HIGH (ref 70–99)
Glucose-Capillary: 96 mg/dL (ref 70–99)
Glucose-Capillary: 98 mg/dL (ref 70–99)

## 2021-11-02 LAB — ETHYLENE GLYCOL: Ethylene Glycol Lvl: <5 mg/dL

## 2021-11-02 MED ORDER — POLYETHYLENE GLYCOL 3350 17 G PO PACK
17.0000 g | PACK | Freq: Every day | ORAL | Status: DC
  Administered 2021-11-02 – 2021-11-03 (×2): 17 g via ORAL
  Filled 2021-11-02 (×2): qty 1

## 2021-11-02 MED ORDER — ZINC SULFATE 220 (50 ZN) MG PO CAPS
220.0000 mg | ORAL_CAPSULE | Freq: Every day | ORAL | Status: DC
Start: 2021-11-02 — End: 2021-11-03
  Administered 2021-11-02 – 2021-11-03 (×2): 220 mg via ORAL
  Filled 2021-11-02 (×2): qty 1

## 2021-11-02 MED ORDER — OXYCODONE-ACETAMINOPHEN 5-325 MG PO TABS
1.0000 | ORAL_TABLET | Freq: Four times a day (QID) | ORAL | Status: DC | PRN
  Administered 2021-11-02 – 2021-11-03 (×3): 1 via ORAL
  Filled 2021-11-02 (×3): qty 1

## 2021-11-02 MED ORDER — ASCORBIC ACID 500 MG PO TABS
500.0000 mg | ORAL_TABLET | Freq: Two times a day (BID) | ORAL | Status: DC
Start: 2021-11-02 — End: 2021-11-03
  Administered 2021-11-02 – 2021-11-03 (×3): 500 mg via ORAL
  Filled 2021-11-02 (×3): qty 1

## 2021-11-02 MED ORDER — ADULT MULTIVITAMIN W/MINERALS CH
1.0000 | ORAL_TABLET | Freq: Every day | ORAL | Status: DC
  Administered 2021-11-02 – 2021-11-03 (×2): 1 via ORAL
  Filled 2021-11-02 (×2): qty 1

## 2021-11-02 MED ORDER — DOCUSATE SODIUM 100 MG PO CAPS
200.0000 mg | ORAL_CAPSULE | Freq: Two times a day (BID) | ORAL | Status: DC
  Administered 2021-11-02 – 2021-11-03 (×3): 200 mg via ORAL
  Filled 2021-11-02 (×3): qty 2

## 2021-11-02 MED ORDER — MORPHINE SULFATE (PF) 2 MG/ML IV SOLN: 1.0000 mg | INTRAVENOUS | Status: DC | PRN

## 2021-11-02 MED ORDER — PROSOURCE PLUS PO LIQD
30.0000 mL | Freq: Three times a day (TID) | ORAL | Status: DC
  Administered 2021-11-02 – 2021-11-03 (×3): 30 mL via ORAL
  Filled 2021-11-02 (×5): qty 30

## 2021-11-02 NOTE — Progress Notes (Signed)
PT Cancellation Note ? ?Patient Details ?Name: [name] ?MRN: [id number] ?DOB: 08-26-1955 ? ? ?Cancelled Treatment:    Reason Eval/Treat Not Completed: Other (comment).  TOC reporting pt long term care at [facility].  Therapist called pt's facility and staff member Marguarite Arbour) on pt's unit reports pt does not get OOB (pt refuses) and needs assist with rolling in bed for ADL's/hygiene.  Pt only gets OOB for going to appts (uses stretcher by ambulance).  Based on obtained information, no acute PT needs identified; will sign off at this time. ? ?Leitha Bleak, PT ?11/02/21, 1:13 PM ? ?

## 2021-11-02 NOTE — Progress Notes (Signed)
11/01/2021 at 0500: ? ?Pt refused BLE dressing change. Pt refused sacral dressing change.  ?

## 2021-11-02 NOTE — Progress Notes (Signed)
Initial Nutrition Assessment ? ?DOCUMENTATION CODES:  ? ?Obesity unspecified ? ?INTERVENTION:  ? ?-Liberalize diet to carb modified for wider variety of meal selections ?-MVI with minerals daily ?-500 mg vitamin C BID ?-220 mg zinc sulfate daily ?-30 ml Prosource Plus TID, each supplement provides 100 kcals and 15 grams protein ?-Check zinc level secondary to taste changes ? ?NUTRITION DIAGNOSIS:  ? ?Increased nutrient needs related to wound healing as evidenced by estimated needs. ? ?GOAL:  ? ?Patient will meet greater than or equal to 90% of their needs ? ?MONITOR:  ? ?PO intake, Supplement acceptance ? ?REASON FOR ASSESSMENT:  ? ?Low [place] ?  ? ?ASSESSMENT:  ? ?Pt with history of insulin-dependent diabetes mellitus, peripheral vascular disease, with chronic recurrent bilateral lower extremity Ulcers, hypothyroid, hypertension, hyperlipidemia, CKD stage IIIa at baseline, presence of sacral wound, who presents emergency department from [facility] for chief concerns of intractable nausea and vomiting and abdominal pain. ? ?Pt admitted with DKA and AKI on CKD.  ? ?Spoke with pt at bedside, who reports she feels hungry today. She shares that she has resided at [facility] since 04/2021. She reports that she tries to eat what she can, but often does not like the food. Pt shares that she has been experiencing taste changes since Saturday ("the soup, mashed potatoes, and rice krispies here taste weird"). Pt also reports she was having difficulty swallowing meds PTA, however, this is not longer an issue.  ? ?Per chart, pt often refuses turning. Pt reports being followed by wound center, but does not take vitamins. She states she just started Prosource supplements PTA.  ? ?Discussed importance of good meal and supplement intake to promote healing. Pt amenable to supplements.  ?  ?Reviewed wt hx; pt has experienced a 26% wt loss over the past 6 months, which is significant for time frame. Per RD at SNF, pt has  been progressively losing weight. Suspect edema may be masking further weight loss as well as fat and muscle depletions.  ? ?Medications reviewed and include vitamin D, colace, miralax, and dextrose 5% in lactated ringers infusion @ 75 ml/hr.  ? ?Lab Results  ?Component Value Date  ? HGBA1C 7.9 (H) 10/31/2021  ? PTA DM medications are 8 units insulin glargine daily.  ? ?Labs reviewed: PhosL 2.9, CBGS: 98-194 (inpatient orders for glycemic control are 0-20 units insulin aspart TID with meals, 0-5 units inuslin aspart daily at bedtime, 0-5 units insulin aspart daily at bedtime, and 8 units insulin glargine-yfgn daily).   ? ?NUTRITION - FOCUSED PHYSICAL EXAM: ? ?Flowsheet Row Most Recent Value  ?Orbital Region No depletion  ?Upper Arm Region Mild depletion  ?Thoracic and Lumbar Region No depletion  ?Buccal Region No depletion  ?Temple Region No depletion  ?Clavicle Bone Region No depletion  ?Clavicle and Acromion Bone Region No depletion  ?Scapular Bone Region No depletion  ?Dorsal Hand No depletion  ?Patellar Region No depletion  ?Anterior Thigh Region No depletion  ?Posterior Calf Region No depletion  ?Edema (RD Assessment) Moderate  ?Hair Reviewed  ?Eyes Reviewed  ?Mouth Reviewed  ?Skin Reviewed  ?Nails Reviewed  ? ?  ? ? ?Diet Order:   ?Diet Order   ? ?       ?  Diet heart healthy/carb modified Room service appropriate? Yes; Fluid consistency: Thin  Diet effective now       ?  ? ?  ?  ? ?  ? ? ?EDUCATION NEEDS:  ? ?Education  needs have been addressed ? ?Skin:  Skin Assessment: Skin Integrity Issues: ?Skin Integrity Issues:: Stage III, Other (Comment) ?Stage III: sacrum ?Other: non-healing bilteral BLE wounds ? ?Last BM:  10/29/21 ? ?Height:  ? ?Ht Readings from Last 1 Encounters:  ?10/31/21 5\' 5"  (1.651 m)  ? ? ?Weight:  ? ?Wt Readings from Last 1 Encounters:  ?10/31/21 87.1 kg  ? ? ?Ideal Body Weight:  56.8 kg ? ?BMI:  Body mass index is 31.95 kg/m?. ? ?Estimated Nutritional Needs:  ? ?Kcal:  2000-2200 ? ?Protein:   115-130 grams ? ?Fluid:  > 2 L ? ? ? ?Loistine Chance, RD, LDN, CDCES ?Registered Dietitian II ?Certified Diabetes Care and Education Specialist ?Please refer to [facility] for RD and/or RD on-call/weekend/after hours pager  ?

## 2021-11-02 NOTE — Progress Notes (Signed)
11/02/2021 at 0408: ? ?Pt refused BLE dressing changes. Pt allowed daily sacral dressing change.  ?

## 2021-11-02 NOTE — Progress Notes (Signed)
?PROGRESS NOTE ? ? ?HPI was taken from Dr. Tobie Poet: ?[name] is a 66 year old female with history of insulin-dependent diabetes mellitus, peripheral vascular disease, with chronic recurrent bilateral lower extremity Ulcers, hypothyroid, hypertension, hyperlipidemia, CKD stage IIIa at baseline, presence of sacral wound, who presents emergency department from [facility] via EMS for chief concerns of intractable nausea and vomiting and abdominal pain. ? ?Initial vitals in the emergency department show temperature of 98.5, respiration rate of 12, and increased to 27, heart rate of 93, blood pressure 139/65, SPO2 of 97% on room air. ? ?Serum sodium is 132, potassium 4.2, chloride of 95, bicarb of 20, BUN of 19, serum creatinine of 1.53, GFR 37, nonfasting blood glucose 216, WBC 8.9, hemoglobin 11.5, platelets of 524. ?  ?Anion gap was elevated at 17. ? ?Beta hydroxybutyrate was positive at 3.51. ?  ?UA was positive for trace leukocytes. ?  ?CT abdomen pelvis with contrast was ordered by EDP and read as no acute abdominal pelvic findings.  Cholelithiasis without CT evidence of acute cholecystitis.  Moderate volume of stool throughout the colon.  Prominence of the endometrial stripe, abnormal in the postmenopausal setting.  Nonemergent pelvic ultrasound is recommended. ?  ?ED treatment: Morphine 2 mg IV, ondansetron 4 mg IV x2, sodium chloride 1 L bolus. ?  ?At bedside she is able to tell me her name, age, and she knows the current year is 2023.  She has a flat affect. ?  ?She started vomiting two days ago, she denies blood. She denies known sick contacts. She endorses not able to keep food down for two days. She denies chest pain, shortness of breath, dysuria, hematuria, diarrhea.  ?  ?She denies fever, cough. She states she is just nauseous and feels like she needs to vomit.  ?  ?Social history: She is from assisted living, [place]. She denies history of tobacco, etoh, recreational drug use.  ? ?As per Dr.  Jimmye Norman 5/7-11/02/21: Pt presented in DKA and was initially put on IV insulin drip. Anion gap closed and pt was transitioned back to sq insulin. Pt continued to have nausea after DKA resolved. Pt was finally advanced to carb modified/heart healthy yesterday afternoon. PT/OT was consulted but has been refusing work with therapy. At d/c pt will go back to long term care at [facility]  ? ? ? ?[name]  TDD:[id number] DOB: 29-Nov-1955 DOA: 10/30/2021 ?PCP: Juline Patch, MD  ? ? ?Assessment & Plan: ?  ?Principal Problem: ?  DKA (diabetic ketoacidosis) ([place]) ?Active Problems: ?  Pressure ulcer ?  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 ([place]) ?  Essential hypertension ?  Hypothyroidism ?  Type 2 diabetes mellitus with hyperlipidemia ([place]) ?  Morbid obesity ([place]) ?  Acute kidney injury superimposed on CKD ([place]) ?  PVD (peripheral vascular disease) ([place]) ?  Stage 3a chronic kidney disease (CKD) ([place]) ? ?Assessment and Plan: ? ? ?DKA: w/ hx of likely poorly controlled DM2. Anion gap is closed. Resolved  ? ?DM2: poorly controlled, HbA1c 7.9. Continue on glargine, SSI w/ accuchecks. Zofran, compazine & scopolamine prn for nausea ?  ?AKI on CKDIIIa: Cr is trending down from day prior. ?  ?Hypothyroidism: continue on levothyroxine  ?  ?HTN: continue on metoprolol, amlodipine, clonidine. Can likely restart losartan ? ?Obesity: BMI 31.9. Would benefit from weight loss  ? ? ? ?DVT prophylaxis: lovenox  ?Code Status: full  ?Family Communication:  ?Disposition Plan: d/c back to [facility]  ? ?Level of care: Med-Surg ? ?Status is: Inpatient ?Remains inpatient appropriate because: can likely d/c back to [facility] tomorrow  ? ? ?Consultants:  ? ? ?Procedures:  ? ?Antimicrobials: ? ?Subjective: ?Pt c/o malaise  ? ?Objective: ?Vitals:  ? 11/01/21 1604 11/01/21 2045 11/02/21 0029 11/02/21 0400  ?BP: (!) 125/55 (!) 128/56 (!) 111/50 (!) 123/54  ?Pulse: 95 88 66 71  ?Resp: 18 16  15    ?Temp: 98.8 ?F (37.1 ?C) 98.2 ?F (36.8 ?C)  98 ?F (36.7 ?C)  ?TempSrc:      ?SpO2: 100% 100% 98% 98%  ?Weight:      ?Height:      ? ?No intake or output data in the 24 hours ending 11/02/21 0727 ? ?Filed Weights  ? 10/30/21 1405 10/31/21 0000  ?Weight: 90.7 kg 87.1 kg  ? ? ?Examination: ? ?General exam: Appears calm but uncomfortable  ?Respiratory system: clear breath sounds b/l   ?Cardiovascular system: S1 & S2+. No rubs or clicks  ?Gastrointestinal system: Abd is soft, NT, obese & hypoactive bowel sounds  ?Central nervous system: Alert and awake. Moves all extremities  ?Psychiatry: Judgement and insight appears at baseline. Flat mood and affect ? ? ? ?Data Reviewed: I have personally reviewed following labs and imaging studies ? ?CBC: ?Recent Labs  ?Lab 10/30/21 ?1426 11/01/21 ?0415 11/02/21 ?0425  ?WBC 8.9 8.7 6.6  ?HGB 11.5* 10.6* 9.7*  ?HCT 36.8 34.3* 31.6*  ?MCV 79.5* 80.3 81.9  ?PLT 524* 440* 357  ? ?Basic Metabolic Panel: ?Recent Labs  ?Lab 10/30/21 ?1426 10/30/21 ?2336 10/30/21 ?2350 10/31/21 ?1610 11/01/21 ?9604 11/02/21 ?0425  ?NA 132*  --  136 136 136 138  ?K 4.2  --  4.1 3.9 3.6 3.5  ?CL 95*  --  99 102 104 103  ?CO2 20*  --  21* 23 24 28   ?GLUCOSE 214*  --  203* 163* 126* 96  ?BUN 19  --  20 18 11 10   ?CREATININE 1.53*  --  1.57* 1.26* 1.27* 1.15*  ?CALCIUM 9.1  --  8.9 8.7* 8.6* 8.6*  ?MG  --  2.2  --   --   --   --   ? ?GFR: ?Estimated Creatinine Clearance: 51 mL/min (A) (by C-G formula based on SCr of 1.15 mg/dL (H)). ?Liver Function Tests: ?Recent Labs  ?Lab 10/30/21 ?1426  ?AST 21  ?ALT 8  ?ALKPHOS 86  ?BILITOT 1.0  ?PROT 7.9  ?ALBUMIN 2.7*  ? ?Recent Labs  ?Lab 10/30/21 ?1426  ?LIPASE 29  ? ?No results for input(s): AMMONIA in the last 168 hours. ?Coagulation Profile: ?No results for input(s): INR, PROTIME in the last 168 hours. ?Cardiac Enzymes: ?No results for input(s): CKTOTAL, CKMB, CKMBINDEX, TROPONINI in the last 168 hours. ?BNP (last 3 results) ?No results for input(s): PROBNP in the last  8760 hours. ?HbA1C: ?Recent Labs  ?  10/31/21 ?0436  ?HGBA1C 7.9*  ? ?CBG: ?Recent Labs  ?Lab 11/01/21 ?1213 11/01/21 ?1645 11/01/21 ?2046 11/02/21 ?5409 11/02/21 ?0359  ?GLUCAP 73 194* 150* 170* 131*  ? ?Lipid Profile: ?No results for input(s): CHOL, HDL, LDLCALC, TRIG, CHOLHDL, LDLDIRECT in the last 72 hours. ?Thyroid Function Tests: ?No results for input(s): TSH, T4TOTAL, FREET4, T3FREE, THYROIDAB in the last 72 hours. ?Anemia Panel: ?No results for input(s): VITAMINB12, FOLATE, FERRITIN, TIBC, IRON, RETICCTPCT in the last 72 hours. ?Sepsis Labs: ?Recent Labs  ?Lab 10/30/21 ?2336  ?PROCALCITON <0.10  ? ? ?Recent Results (from the past 240 hour(s))  ?  Urine Culture     Status: Abnormal (Preliminary result)  ? Collection Time: 10/30/21  4:49 PM  ? Specimen: Urine, Clean Catch  ?Result Value Ref Range Status  ? Specimen Description   Final  ?  URINE, CLEAN CATCH ?Performed at [facility], [address] [place], [place] [address] ?  ? Special Requests   Final  ?  NONE ?Performed at [facility], [address] [place], [place] [address] ?  ? Culture (A)  Final  ?  60,000 COLONIES/mL KLEBSIELLA SPECIES ?SUSCEPTIBILITIES TO FOLLOW ?Performed at [facility], [place] [address], [place], [place] [address] ?  ? Report Status PENDING  Incomplete  ?MRSA Next Gen by PCR, Nasal     Status: None  ? Collection Time: 10/30/21 11:41 PM  ? Specimen: Nasal Mucosa; Nasal Swab  ?Result Value Ref Range Status  ? MRSA by PCR Next Gen NOT DETECTED NOT DETECTED Final  ?  Comment: (NOTE) ?The GeneXpert MRSA Assay (FDA approved for NASAL specimens only), ?is one component of a comprehensive MRSA colonization surveillance ?program. It is not intended to diagnose MRSA infection nor to guide ?or monitor treatment for MRSA infections. ?Test performance is not FDA approved in patients less than 2 years ?old. ?Performed at [facility], [place], ?[place] [address] ?  ?  ? ? ? ? ? ?Radiology  Studies: ?No results found. ? ? ? ? ? ?Scheduled Meds: ? amLODipine  10 mg Oral Daily  ? atorvastatin  20 mg Oral Daily  ? Chlorhexidine Gluconate Cloth  6 each Topical Daily  ? cholecalciferol  5,000 Units Or

## 2021-11-02 NOTE — Progress Notes (Signed)
Patient refused dressing changes. ?

## 2021-11-03 DIAGNOSIS — N179 Acute kidney failure, unspecified: Secondary | ICD-10-CM | POA: Diagnosis not present

## 2021-11-03 DIAGNOSIS — I10 Essential (primary) hypertension: Secondary | ICD-10-CM | POA: Diagnosis not present

## 2021-11-03 DIAGNOSIS — E039 Hypothyroidism, unspecified: Secondary | ICD-10-CM | POA: Diagnosis not present

## 2021-11-03 DIAGNOSIS — E1110 Type 2 diabetes mellitus with ketoacidosis without coma: Secondary | ICD-10-CM | POA: Diagnosis not present

## 2021-11-03 DIAGNOSIS — L89153 Pressure ulcer of sacral region, stage 3: Secondary | ICD-10-CM

## 2021-11-03 LAB — BASIC METABOLIC PANEL
Anion gap: 3 — ABNORMAL LOW (ref 5–15)
BUN: 11 mg/dL (ref 8–23)
CO2: 27 mmol/L (ref 22–32)
Calcium: 8 mg/dL — ABNORMAL LOW (ref 8.9–10.3)
Chloride: 105 mmol/L (ref 98–111)
Creatinine, Ser: 1.2 mg/dL — ABNORMAL HIGH (ref 0.44–1.00)
GFR, Estimated: 49 mL/min — ABNORMAL LOW (ref >60)
Glucose, Bld: 128 mg/dL — ABNORMAL HIGH (ref 70–99)
Potassium: 3.5 mmol/L (ref 3.5–5.1)
Sodium: 135 mmol/L (ref 135–145)

## 2021-11-03 LAB — URINALYSIS, COMPLETE (UACMP) WITH MICROSCOPIC
Bilirubin Urine: NEGATIVE
Glucose, UA: NEGATIVE mg/dL
Ketones, ur: NEGATIVE mg/dL
Nitrite: NEGATIVE
Protein, ur: NEGATIVE mg/dL
Specific Gravity, Urine: 1.015 (ref 1.005–1.030)
pH: 7 (ref 5.0–8.0)

## 2021-11-03 LAB — CBC
HCT: 29.7 % — ABNORMAL LOW (ref 36.0–46.0)
Hemoglobin: 9.3 g/dL — ABNORMAL LOW (ref 12.0–15.0)
MCH: 25.1 pg — ABNORMAL LOW (ref 26.0–34.0)
MCHC: 31.3 g/dL (ref 30.0–36.0)
MCV: 80.1 fL (ref 80.0–100.0)
Platelets: 277 10*3/uL (ref 150–400)
RBC: 3.71 MIL/uL — ABNORMAL LOW (ref 3.87–5.11)
RDW: 17.3 % — ABNORMAL HIGH (ref 11.5–15.5)
WBC: 6 10*3/uL (ref 4.0–10.5)
nRBC: 0 % (ref 0.0–0.2)

## 2021-11-03 LAB — GLUCOSE, CAPILLARY
Glucose-Capillary: 123 mg/dL — ABNORMAL HIGH (ref 70–99)
Glucose-Capillary: 128 mg/dL — ABNORMAL HIGH (ref 70–99)
Glucose-Capillary: 131 mg/dL — ABNORMAL HIGH (ref 70–99)
Glucose-Capillary: 152 mg/dL — ABNORMAL HIGH (ref 70–99)

## 2021-11-03 MED ORDER — PROSOURCE PLUS PO LIQD: 30.0000 mL | Freq: Three times a day (TID) | ORAL | 0 refills | Status: DC

## 2021-11-03 MED ORDER — POLYETHYLENE GLYCOL 3350 17 G PO PACK: 17.0000 g | PACK | Freq: Every day | ORAL | 0 refills | Status: DC

## 2021-11-03 MED ORDER — CEPHALEXIN 500 MG PO CAPS: 500.0000 mg | ORAL_CAPSULE | Freq: Three times a day (TID) | ORAL | Status: DC

## 2021-11-03 MED ORDER — OXYCODONE HCL 5 MG PO TABS: 5.0000 mg | ORAL_TABLET | Freq: Four times a day (QID) | ORAL | 0 refills | Status: DC | PRN

## 2021-11-03 MED ORDER — SCOPOLAMINE 1 MG/3DAYS TD PT72: 1.0000 | MEDICATED_PATCH | TRANSDERMAL | 0 refills | Status: DC

## 2021-11-03 MED ORDER — CEFTRIAXONE SODIUM 1 G IJ SOLR
1.0000 g | INTRAMUSCULAR | Status: DC
  Administered 2021-11-03: 1 g via INTRAVENOUS
  Filled 2021-11-03: qty 1

## 2021-11-03 MED ORDER — ASCORBIC ACID 500 MG PO TABS
500.0000 mg | ORAL_TABLET | Freq: Every day | ORAL | 0 refills | Status: AC
Start: 2021-11-03 — End: ?

## 2021-11-03 MED ORDER — CEPHALEXIN 500 MG PO CAPS: 500.0000 mg | ORAL_CAPSULE | Freq: Three times a day (TID) | ORAL | 0 refills | Status: AC

## 2021-11-03 MED ORDER — ZINC SULFATE 220 (50 ZN) MG PO CAPS
220.0000 mg | ORAL_CAPSULE | Freq: Every day | ORAL | 0 refills | Status: DC
Start: 2021-11-04 — End: 2023-09-04

## 2021-11-03 MED ORDER — ADULT MULTIVITAMIN W/MINERALS CH: 1.0000 | ORAL_TABLET | Freq: Every day | ORAL | 0 refills | Status: AC

## 2021-11-03 NOTE — TOC Initial Note (Signed)
Transition of Care (TOC) - Initial/Assessment Note  ? ? ?Patient Details  ?Name: [name] ?MRN: [id number] ?Date of Birth: [birth year] ? ?Transition of Care (TOC) CM/SW Contact:    ?Pete Pelt, RN ?Phone Number: ?11/03/2021, 12:43 PM ? ?Clinical Narrative:    Resident of [facility].  As per Alphia Kava, the facility is able to take patient back on discharge.             ? ? ?Expected Discharge Plan:  (patient is a resident of [place] skilled facility) ?Barriers to Discharge: Continued Medical Work up ? ? ?Patient Goals and CMS Choice ?  ?  ?Choice offered to / list presented to : NA ? ?Expected Discharge Plan and Services ?Expected Discharge Plan:  ([facility] a [facility] of [facility]) ?  ?  ?  ?  ?                ?  ?  ?  ?  ?  ?  ?  ?  ?  ?  ? ?Prior Living Arrangements/Services ?  ?Lives with:: Facility Resident ?Patient language and need for interpreter reviewed:: Yes ?Do you feel safe going back to the place where you live?: Yes      ?Need for Family Participation in Patient Care: Yes (Comment) ?Care giver support system in place?: Yes (comment) ?  ?Criminal Activity/Legal Involvement Pertinent to Current Situation/Hospitalization: No - Comment as needed ? ?Activities of Daily Living ?Home Assistive Devices/Equipment: Hospital bed ?ADL Screening (condition at time of admission) ?Patient's cognitive ability adequate to safely complete daily activities?: No ?Is the patient deaf or have difficulty hearing?: No ?Does the patient have difficulty seeing, even when wearing glasses/contacts?: No ?Does the patient have difficulty concentrating, remembering, or making decisions?: No ?Patient able to express need for assistance with ADLs?: Yes ?Does the patient have difficulty dressing or bathing?: Yes ?Independently performs ADLs?: Yes (appropriate for developmental age) ?Does the patient have difficulty walking or climbing stairs?: Yes ?Weakness of Legs: Both ?Weakness of  Arms/Hands: None ? ?Permission Sought/Granted ?Permission sought to share information with : Customer service manager ?Permission granted to share information with : Yes, Verbal Permission Granted ?   ? Permission granted to share info w AGENCY: [facility] ?   ?   ? ?Emotional Assessment ?  ?  ?  ?  ?Alcohol / Substance Use: Not Applicable ?Psych Involvement: No (comment) ? ?Admission diagnosis:  DKA (diabetic ketoacidosis) ([place]) [E11.10] ?Lower abdominal pain [R10.30] ?Patient Active Problem List  ? Diagnosis Date Noted  ? DKA (diabetic ketoacidosis) ([place]) 10/30/2021  ? Stage 3a chronic kidney disease (CKD) ([place]) 10/30/2021  ? PVD (peripheral vascular disease) ([place])   ? Vertigo   ? Sepsis without acute organ dysfunction (HCC)   ? Acute kidney injury superimposed on CKD ([place])   ? Adjustment disorder with anxiety 04/21/2021  ? Sepsis ([place]) 04/17/2021  ? Pressure injury of skin 04/17/2021  ? Left leg pain 04/16/2021  ? Morbid obesity ([place]) 10/24/2017  ? Chronic kidney disease, stage IV (severe) ([place]) 10/24/2017  ? Hypothyroidism 11/15/2016  ? Edema 11/15/2016  ? Type 2 diabetes mellitus with hyperlipidemia ([place]) 11/15/2016  ? Essential hypertension 12/09/2015  ? 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 Pressure ulcer 05/31/2015  ? Primary osteoarthritis of right hip 05/28/2015  ? ?PCP:  Juline Patch, MD ?Pharmacy:   ?[facility] (  N), [place] - [address] ?[place] ?[place] ([place]) [place] [address] ?Phone: [phone] Fax: [phone] ? ? ? ? ?Social Determinants of Health (SDOH) Interventions ?  ? ?Readmission Risk Interventions ? ?  11/03/2021  ? 12:41 PM  ?Readmission Risk Prevention Plan  ?Transportation Screening Complete  ?PCP or Specialist Appt within 3-5 Days Complete  ?[place] or Home Care Consult Complete  ?Social Work Consult for [place] Planning/Counseling Complete  ?Palliative Care  Screening Complete  ?Medication Review Press photographer) Complete  ? ? ? ?

## 2021-11-03 NOTE — Progress Notes (Signed)
Patient being discharged to [facility]. Called report to [facility] and gave report to nurse Blythe Stanford. All paper work is in discharge packet.  ?

## 2021-11-03 NOTE — Progress Notes (Signed)
11/03/2021 at 0625: ? ?This RN and assigned NT at pt bedside to inspect linen and purewick for soiling. Pt is crying and screaming for staff to, "leave the room and leave me alone." Pt is educated on correlation of incontinence and exacerbation of skin breakdown given that the skin is already compromised with a stage 3 wound to the sacrum. Pt continues to refuse linen check. Pt refused sacral wound dressing change. Pt refused BLE dressing change. Pt is resting and call bell within reach.  ?

## 2021-11-03 NOTE — Progress Notes (Signed)
11/03/2021 at 0645: ? ?Charge RN alerts this RN that pt is requesting assistance at bedside. This RN appears at pt's bedside. Pt asks can purewick be adjusted because, "it feels funny." This RN adjusts the pt's purewick. Bed pad is clean, dry, and intact. Call bell within pt's reach.  ?

## 2021-11-03 NOTE — Consult Note (Signed)
[facility] CM Inpatient Consult ? ? ?11/03/2021 ? ?[name] ?Feb 11, 1956 ?[id number] ? ?[place] Management [facility] CM) ?  ?Patient chart has been reviewed with noted high risk score for unplanned readmissions.  Patient assessed for community [place] Management follow up needs.  ? ?Per review, patient primary provider office listed for transition of care follow up. ? ?Plan: Will continue to follow for progression and disposition. ?  ?Of note, [facility] Management services does not replace or interfere with any services that are arranged by inpatient case management or social work.  ?  ?Netta Cedars, MSN, RN ?[facility] Liaison ?Phone [phone] ?Toll free office [phone]   ? ?

## 2021-11-03 NOTE — Discharge Summary (Signed)
?Physician Discharge Summary ?  ?Patient: [name] MRN: [id number] DOB: [birth year]  ?Admit date:     10/30/2021  ?Discharge date: 11/03/21  ?Discharge Physician: Loletha Grayer  ? ?PCP: Juline Patch, MD  ? ?Recommendations at discharge:  ? ?Follow-up with Dr. [facility] ?Follow-up with wound care center ? ?Discharge Diagnoses: ?Principal Problem: ?  DKA (diabetic ketoacidosis) ([place]) ?Active Problems: ?  Pressure ulcer ?  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 ([place]) ?  Essential hypertension ?  Hypothyroidism ?  Type 2 diabetes mellitus with hyperlipidemia ([place]) ?  Morbid obesity ([place]) ?  Acute kidney injury superimposed on CKD ([place]) ?  PVD (peripheral vascular disease) ([place]) ?  Stage 3a chronic kidney disease (CKD) ([place]) ? ? ?Hospital Course: ?66 year old female with history of insulin-dependent diabetes mellitus, peripheral vascular disease, chronic recurrent bilateral lower extremity ulcers, hypothyroidism, hypertension, hyperlipidemia, chronic kidney disease stage IIIa, stage III sacral decubiti.  She presented to the hospital with nausea vomiting and was found to be in diabetic ketoacidosis.  The patient was started on insulin drip.  Patient was given IV fluid hydration.  The patient converted off insulin drip and is now back on her usual medications for diabetes.  The patient had acute kidney injury on chronic kidney disease stage IIIa.  Creatinine peaked at 1.57 on 10/30/2021.  Creatinine down to 1.2 upon discharge. ? ? ? ? ? ? ? ?Assessment and Plan: ?* DKA (diabetic ketoacidosis) ([place]) ?Patient was initially on an insulin drip but now back on her usual Lantus 8 units at night plus short acting insulin prior to meals ? ?Acute kidney injury superimposed on CKD ([place]) ?Acute kidney injury on chronic kidney disease stage III AA.  Creatinine peaked at 1.57 down to 1.2 upon discharge.  Recommend repeating a BMP in 1 week ? ?Hypothyroidism ?- Resumed home  levothyroxine 112 mcg daily ? ?Essential hypertension ?-  Can go back on Avapro as outpatient.  Patient on clonidine and Toprol. ? ?Stage III sacral decubiti. Present on admission.  Cleanse with soap and water, rinse and pat dry.  Cover with size appropriate piece of silver Hydrofiber (Aquacel Ag plus advantage, Kellie Simmering [phone]) top with dry gauze and secure with silicone foam for sacrum.  Change silver Hydrofiber daily, may reuse silicone foam and change every 3 days.  Change as needed for swelling. ? ?Lower extremity wounds: ?Follow-up at the wound care center.  Cleanse with soap and water, rinse and pat dry.  Cover with folded layers of Xeroform gauze, top with dry gauze, ABD pads and secure by wrapping legs from just below the toes to just below the knee with Kerlix roll gauze.  Secure with paper tape.  Top with Ace bandage.  Continue Prevalon boots while in bed.  Change dressings Monday Wednesday and Friday ? ?Obesity with a BMI of 31.95 ? ? ? ?  ? ? ?Consultants: none ?Procedures performed: none ?Disposition: Long term care ?Diet recommendation:  ?Cardiac and Carb modified diet ?DISCHARGE MEDICATION: ?Allergies as of 11/03/2021   ?No Known Allergies ?  ? ?  ?Medication List  ?  ? ?STOP taking these medications   ? ?doxycycline 100 MG capsule ?Commonly known as: VIBRAMYCIN ?  ? ?  ? ?TAKE these medications   ? ?(feeding supplement) PROSource Plus liquid ?Take 30 mLs by mouth 3 (three) times daily between meals. ?  ?acetaminophen 325 MG tablet ?Commonly known as: TYLENOL ?Take 2 tablets (650 mg total) by mouth every 6 (  six) hours as needed for mild pain (or Fever >/= 101). ?  ?amLODipine 10 MG tablet ?Commonly known as: NORVASC ?Take 1 tablet (10 mg total) by mouth daily. ?  ?ascorbic acid 500 MG tablet ?Commonly known as: VITAMIN C ?Take 1 tablet (500 mg total) by mouth daily. ?  ?blood glucose meter kit and supplies ?1 each by Other route daily. ONE TOUCH ULTRA METER. Use as directed. E11.29 ?  ?cephALEXin 500  MG capsule ?Commonly known as: KEFLEX ?Take 1 capsule (500 mg total) by mouth every 8 (eight) hours for 2 days. ?Start taking on: Nov 04, 2021 ?  ?cloNIDine 0.3 MG tablet ?Commonly known as: CATAPRES ?TAKE ONE TABLET BY MOUTH 3 TIMES DAILY. ?  ?clopidogrel 75 MG tablet ?Commonly known as: PLAVIX ?Take 1 tablet (75 mg total) by mouth daily with breakfast. ?  ?furosemide 20 MG tablet ?Commonly known as: LASIX ?Take 1 tablet (20 mg total) by mouth 2 (two) times daily. ?  ?glucose blood test strip ?1 each by Other route daily. ONE TOUCH ULTRA TEST STRIPS E11.29 ?  ?insulin aspart 100 UNIT/ML injection ?Commonly known as: novoLOG ?Inject 3 Units into the skin 3 (three) times daily with meals. ?  ?irbesartan 150 MG tablet ?Commonly known as: AVAPRO ?Take 1 tablet (150 mg total) by mouth daily. ?  ?Lantus SoloStar 100 UNIT/ML Solostar Pen ?Generic drug: insulin glargine ?Inject 8 Units into the skin at bedtime. ?  ?levothyroxine 112 MCG tablet ?Commonly known as: SYNTHROID ?Take 1 tablet (112 mcg total) by mouth daily before breakfast. ?  ?metoprolol succinate 25 MG 24 hr tablet ?Commonly known as: TOPROL-XL ?Take 0.5 tablets (12.5 mg total) by mouth daily with breakfast. ?  ?multivitamin with minerals Tabs tablet ?Take 1 tablet by mouth daily. ?Start taking on: Nov 04, 2021 ?  ?nystatin powder ?Generic drug: nystatin ?Apply 1 application. topically 3 (three) times daily. ?  ?ondansetron 4 MG disintegrating tablet ?Commonly known as: ZOFRAN-ODT ?Take 2 mg by mouth every 6 (six) hours as needed. ?  ?onetouch ultrasoft lancets ?1 each by Other route daily. Use as instructed E11.29 ?  ?oxyCODONE 5 MG immediate release tablet ?Commonly known as: Oxy IR/ROXICODONE ?Take 1 tablet (5 mg total) by mouth every 6 (six) hours as needed for severe pain. ?  ?polyethylene glycol 17 g packet ?Commonly known as: MIRALAX / GLYCOLAX ?Take 17 g by mouth daily. ?Start taking on: Nov 04, 2021 ?  ?scopolamine 1 MG/3DAYS ?Commonly known as:  TRANSDERM-SCOP ?Place 1 patch (1.5 mg total) onto the skin every 3 (three) days. ?Start taking on: Nov 04, 2021 ?  ?Vitamin D3 125 MCG (5000 UT) Caps ?Take 1 capsule by mouth daily. ?  ?zinc sulfate 220 (50 Zn) MG capsule ?Take 1 capsule (220 mg total) by mouth daily. ?Start taking on: Nov 04, 2021 ?  ? ?  ? ? Follow-up Information   ? ? [facility] up in 1 week(s).   ?Specialty: Wound Care ?Contact information: ?[address] ?[phone] ar ?[phone] ? ?  ?  ? ?  ?  ? ?  ? ?Discharge Exam: ?Filed Weights  ? 10/30/21 1405 10/31/21 0000  ?Weight: 90.7 kg 87.1 kg  ? ?Physical Exam ?HENT:  ?   Head: Normocephalic.  ?   Mouth/Throat:  ?   Pharynx: No oropharyngeal exudate.  ?Eyes:  ?   General: Lids are normal.  ?   Conjunctiva/sclera: Conjunctivae normal.  ?Cardiovascular:  ?  Rate and Rhythm: Normal rate and regular rhythm.  ?   Heart sounds: Normal heart sounds, S1 normal and S2 normal.  ?Pulmonary:  ?   Breath sounds: No decreased breath sounds, wheezing, rhonchi or rales.  ?Abdominal:  ?   Palpations: Abdomen is soft.  ?   Tenderness: There is no abdominal tenderness.  ?Musculoskeletal:  ?   Right lower leg: Swelling present.  ?   Left lower leg: Swelling present.  ?Skin: ?   Comments: Lower extremities covered with Ace bandage.  Left heel small ulceration.  ?Neurological:  ?   Mental Status: She is alert and oriented to person, place, and time.  ?  ? ?Condition at discharge: fair ? ?The results of significant diagnostics from this hospitalization (including imaging, microbiology, ancillary and laboratory) are listed below for reference.  ? ?Imaging Studies: ?CT ABDOMEN PELVIS W CONTRAST ? ?Result Date: 10/30/2021 ?CLINICAL DATA:  Abdominal pain, acute, nonlocalized EXAM: CT ABDOMEN AND PELVIS WITH CONTRAST TECHNIQUE: Multidetector CT imaging of the abdomen and pelvis was performed using the standard protocol following bolus administration of intravenous  contrast. RADIATION DOSE REDUCTION: This exam was performed according to the departmental dose-optimization program which includes automated exposure control, adjustment of the mA and/or kV according to patient size a

## 2021-11-03 NOTE — [place] FL2 (Signed)
?[place] MEDICAID FL2 LEVEL OF CARE SCREENING TOOL  ?  ? ?IDENTIFICATION  ?Patient Name: ?[name] Birthdate: 09-03-1955 Sex: female Admission Date (Current Location): ?10/30/2021  ?[place] and [place] Number: ? [place] ?  Facility and Address:  ?[facility], [address], [place], [place] [address] ?     Provider Number: ?[id number]  ?Attending Physician Name and Address:  ?Loletha Grayer, MD ? Relative Name and Phone Number:  ?Loy,Tucretia Jerilynn Mages (Friend)   [phone] [facility] Phone) ?   ?Current Level of Care: ?Hospital Recommended Level of Care: ?[place] Prior Approval Number: ?  ? ?Date Approved/Denied: ?  PASRR Number: ?[id number] ? ?Discharge Plan: ?Other (Comment) ?  ? ?Current Diagnoses: ?Patient Active Problem List  ? Diagnosis Date Noted  ? DKA (diabetic ketoacidosis) ([place]) 10/30/2021  ? Stage 3a chronic kidney disease (CKD) ([place]) 10/30/2021  ? PVD (peripheral vascular disease) ([place])   ? Vertigo   ? Sepsis without acute organ dysfunction (HCC)   ? Acute kidney injury superimposed on CKD ([place])   ? Adjustment disorder with anxiety 04/21/2021  ? Sepsis ([place]) 04/17/2021  ? Pressure injury of skin 04/17/2021  ? Left leg pain 04/16/2021  ? Morbid obesity ([place]) 10/24/2017  ? Chronic kidney disease, stage IV (severe) ([place]) 10/24/2017  ? Hypothyroidism 11/15/2016  ? Edema 11/15/2016  ? Type 2 diabetes mellitus with hyperlipidemia ([place]) 11/15/2016  ? Essential hypertension 12/09/2015  ? 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 ([place]) 06/26/2015  ? Pressure ulcer 05/31/2015  ? Primary osteoarthritis of right hip 05/28/2015  ? ? ?Orientation RESPIRATION BLADDER Height & Weight   ?  ?Self, Time, Situation ? Normal Continent Weight: 87.1 kg ?Height:  5\' 5"  (165.1 cm)  ?BEHAVIORAL SYMPTOMS/MOOD NEUROLOGICAL BOWEL NUTRITION STATUS  ?    Continent Diet (carb nodified)  ?AMBULATORY STATUS COMMUNICATION OF NEEDS Skin    ?Total Care Verbally  (deep tissue injury to toe ,foam dressing) ?  ?  ?  ?    ?     ?     ? ? ?Personal Care Assistance Level of Assistance  ?Bathing, Dressing, Feeding Bathing Assistance: Limited assistance ?Feeding assistance: Limited assistance ?Dressing Assistance: Limited assistance ?   ? ?Functional Limitations Info  ?Sight, Hearing, Speech Sight Info: Impaired ?Hearing Info: Impaired ?Speech Info: Adequate  ? ? ?SPECIAL CARE FACTORS FREQUENCY  ?    ?  ?  ?  ?  ?  ?  ?   ? ? ?Contractures Contractures Info: Not present  ? ? ?Additional Factors Info  ?Code Status, Allergies Code Status Info: FULL CODE ?Allergies Info: nka ?  ?  ?  ?   ? ?Current Medications (11/03/2021):  This is the current hospital active medication list ?Current Facility-Administered Medications  ?Medication Dose Route Frequency Provider Last Rate Last Admin  ? (feeding supplement) PROSource Plus liquid 30 mL  30 mL Oral TID BM Wyvonnia Dusky, MD   30 mL at 11/03/21 3825  ? amLODipine (NORVASC) tablet 10 mg  10 mg Oral Daily Cox, Amy N, DO   10 mg at 11/03/21 0539  ? ascorbic acid (VITAMIN C) tablet 500 mg  500 mg Oral BID Wyvonnia Dusky, MD   500 mg at 11/03/21 7673  ? atorvastatin (LIPITOR) tablet 20 mg  20 mg Oral Daily Cox, Amy N, DO   20 mg at 11/03/21 4193  ? cefTRIAXone (ROCEPHIN) 1 g in sodium chloride 0.9 % 100 mL  IVPB  1 g Intravenous Q24H Loletha Grayer, MD 200 mL/hr at 11/03/21 1236 1 g at 11/03/21 1236  ? [START ON 11/04/2021] cephALEXin (KEFLEX) capsule 500 mg  500 mg Oral Q8H Wieting, Richard, MD      ? cholecalciferol (VITAMIN D) tablet 5,000 Units  5,000 Units Oral Daily Cox, Amy N, DO   5,000 Units at 11/03/21 0810  ? cloNIDine (CATAPRES) tablet 0.3 mg  0.3 mg Oral TID Wyvonnia Dusky, MD   0.3 mg at 11/03/21 0813  ? clopidogrel (PLAVIX) tablet 75 mg  75 mg Oral Q breakfast Cox, Amy N, DO   75 mg at 11/03/21 2353  ? dextrose 50 % solution 0-50 mL  0-50 mL Intravenous PRN Cox, Amy N, DO      ? docusate sodium  (COLACE) capsule 200 mg  200 mg Oral BID Wyvonnia Dusky, MD   200 mg at 11/03/21 6144  ? enoxaparin (LOVENOX) injection 45 mg  0.5 mg/kg Subcutaneous Q24H Cox, Amy N, DO   45 mg at 11/02/21 2127  ? hydrALAZINE (APRESOLINE) injection 5 mg  5 mg Intravenous Q6H PRN Cox, Amy N, DO      ? insulin aspart (novoLOG) injection 0-20 Units  0-20 Units Subcutaneous TID WC Cox, Amy N, DO   3 Units at 11/03/21 1231  ? insulin aspart (novoLOG) injection 0-5 Units  0-5 Units Subcutaneous QHS Cox, Amy N, DO      ? insulin glargine-yfgn (SEMGLEE) injection 8 Units  8 Units Subcutaneous QHS Cox, Amy N, DO   8 Units at 11/02/21 2127  ? levothyroxine (SYNTHROID) tablet 112 mcg  112 mcg Oral Q0600 Cox, Amy N, DO   112 mcg at 11/03/21 3154  ? metoprolol succinate (TOPROL-XL) 24 hr tablet 12.5 mg  12.5 mg Oral Q breakfast Cox, Amy N, DO   12.5 mg at 11/03/21 0809  ? morphine (PF) 2 MG/ML injection 1 mg  1 mg Intravenous Q4H PRN Wyvonnia Dusky, MD      ? multivitamin with minerals tablet 1 tablet  1 tablet Oral Daily Wyvonnia Dusky, MD   1 tablet at 11/03/21 0809  ? nystatin (MYCOSTATIN/NYSTOP) topical powder 1 application.  1 application. Topical BID Cox, Amy N, DO   1 application. at 11/03/21 0814  ? ondansetron (ZOFRAN) tablet 4 mg  4 mg Oral Q6H PRN Cox, Amy N, DO   4 mg at 11/01/21 1726  ? Or  ? ondansetron (ZOFRAN) injection 4 mg  4 mg Intravenous Q6H PRN Cox, Amy N, DO   4 mg at 11/01/21 0608  ? oxyCODONE-acetaminophen (PERCOCET/ROXICET) 5-325 MG per tablet 1 tablet  1 tablet Oral Q6H PRN Wyvonnia Dusky, MD   1 tablet at 11/03/21 0086  ? polyethylene glycol (MIRALAX / GLYCOLAX) packet 17 g  17 g Oral Daily Wyvonnia Dusky, MD   17 g at 11/03/21 7619  ? prochlorperazine (COMPAZINE) injection 10 mg  10 mg Intravenous Q6H PRN Wyvonnia Dusky, MD      ? scopolamine (TRANSDERM-SCOP) 1 MG/3DAYS 1.5 mg  1 patch Transdermal Q72H Wyvonnia Dusky, MD   1.5 mg at 11/01/21 0941  ? zinc sulfate capsule 220 mg  220  mg Oral Daily Wyvonnia Dusky, MD   220 mg at 11/03/21 5093  ? ? ? ?Discharge Medications: ?Please see discharge summary for a list of discharge medications. ? ?Relevant Imaging Results: ? ?Relevant Lab Results: ? ? ?Additional Information ?[id number] ? ?Pete Pelt, RN ? ? ? ? ?

## 2021-11-03 NOTE — TOC Progression Note (Addendum)
Transition of Care (TOC) - Progression Note  ? ? ?Patient Details  ?Name: [name] ?MRN: [id number] ?Date of Birth: 1956/01/08 ? ?Transition of Care (TOC) CM/SW Contact  ?Pete Pelt, RN ?Phone Number: ?11/03/2021, 1:16 PM ? ?Clinical Narrative:   Patient can return to [facility] [employer] today as per deborah meyers left message for Loy,Tucretia M (Friend)  ?[phone] [facility] Phone) ? ? ? ? ?Expected Discharge Plan:  (patient is a resident of [facility] skilled facility) ?Barriers to Discharge: Continued Medical Work up ? ?Expected Discharge Plan and Services ?Expected Discharge Plan:  (patient is a resident of [facility] skilled facility) ?  ?  ?  ?  ?Expected Discharge Date: 11/03/21               ?  ?  ?  ?  ?  ?  ?  ?  ?  ?  ? ? ?Social Determinants of Health (SDOH) Interventions ?  ? ?Readmission Risk Interventions ? ?  11/03/2021  ? 12:41 PM  ?Readmission Risk Prevention Plan  ?Transportation Screening Complete  ?PCP or Specialist Appt within 3-5 Days Complete  ?[place] or Home Care Consult Complete  ?Social Work Consult for [place] Planning/Counseling Complete  ?Palliative Care Screening Complete  ?Medication Review [employer]) Complete  ? ? ?

## 2021-11-04 LAB — ZINC: Zinc: 61 ug/dL (ref 44–115)

## 2021-11-10 DIAGNOSIS — R69 Illness, unspecified: Secondary | ICD-10-CM | POA: Diagnosis not present

## 2021-11-10 DIAGNOSIS — L89159 Pressure ulcer of sacral region, unspecified stage: Secondary | ICD-10-CM | POA: Diagnosis not present

## 2021-11-10 DIAGNOSIS — N390 Urinary tract infection, site not specified: Secondary | ICD-10-CM | POA: Diagnosis not present

## 2021-11-10 DIAGNOSIS — E039 Hypothyroidism, unspecified: Secondary | ICD-10-CM | POA: Diagnosis not present

## 2021-11-10 DIAGNOSIS — F411 Generalized anxiety disorder: Secondary | ICD-10-CM | POA: Diagnosis not present

## 2021-11-10 DIAGNOSIS — E1110 Type 2 diabetes mellitus with ketoacidosis without coma: Secondary | ICD-10-CM | POA: Diagnosis not present

## 2021-11-10 DIAGNOSIS — G8929 Other chronic pain: Secondary | ICD-10-CM | POA: Diagnosis not present

## 2021-11-10 DIAGNOSIS — I10 Essential (primary) hypertension: Secondary | ICD-10-CM | POA: Diagnosis not present

## 2021-11-10 DIAGNOSIS — F4321 Adjustment disorder with depressed mood: Secondary | ICD-10-CM | POA: Diagnosis not present

## 2021-11-10 DIAGNOSIS — E119 Type 2 diabetes mellitus without complications: Secondary | ICD-10-CM | POA: Diagnosis not present

## 2021-11-10 DIAGNOSIS — I509 Heart failure, unspecified: Secondary | ICD-10-CM | POA: Diagnosis not present

## 2021-11-15 DIAGNOSIS — F411 Generalized anxiety disorder: Secondary | ICD-10-CM | POA: Diagnosis not present

## 2021-11-15 DIAGNOSIS — F4321 Adjustment disorder with depressed mood: Secondary | ICD-10-CM | POA: Diagnosis not present

## 2021-11-15 DIAGNOSIS — R69 Illness, unspecified: Secondary | ICD-10-CM | POA: Diagnosis not present

## 2021-11-16 DIAGNOSIS — E039 Hypothyroidism, unspecified: Secondary | ICD-10-CM | POA: Diagnosis not present

## 2021-11-16 DIAGNOSIS — K5900 Constipation, unspecified: Secondary | ICD-10-CM | POA: Diagnosis not present

## 2021-11-16 DIAGNOSIS — E1110 Type 2 diabetes mellitus with ketoacidosis without coma: Secondary | ICD-10-CM | POA: Diagnosis not present

## 2021-11-16 DIAGNOSIS — R1310 Dysphagia, unspecified: Secondary | ICD-10-CM | POA: Diagnosis not present

## 2021-11-16 DIAGNOSIS — I10 Essential (primary) hypertension: Secondary | ICD-10-CM | POA: Diagnosis not present

## 2021-11-16 DIAGNOSIS — G8929 Other chronic pain: Secondary | ICD-10-CM | POA: Diagnosis not present

## 2021-11-16 DIAGNOSIS — I890 Lymphedema, not elsewhere classified: Secondary | ICD-10-CM | POA: Diagnosis not present

## 2021-11-16 DIAGNOSIS — M542 Cervicalgia: Secondary | ICD-10-CM | POA: Diagnosis not present

## 2021-11-17 DIAGNOSIS — R1314 Dysphagia, pharyngoesophageal phase: Secondary | ICD-10-CM | POA: Diagnosis not present

## 2021-11-17 DIAGNOSIS — R1312 Dysphagia, oropharyngeal phase: Secondary | ICD-10-CM | POA: Diagnosis not present

## 2021-11-17 DIAGNOSIS — E0590 Thyrotoxicosis, unspecified without thyrotoxic crisis or storm: Secondary | ICD-10-CM | POA: Diagnosis not present

## 2021-11-18 DIAGNOSIS — R1312 Dysphagia, oropharyngeal phase: Secondary | ICD-10-CM | POA: Diagnosis not present

## 2021-11-18 DIAGNOSIS — R1314 Dysphagia, pharyngoesophageal phase: Secondary | ICD-10-CM | POA: Diagnosis not present

## 2021-11-19 ENCOUNTER — Ambulatory Visit: Payer: Medicare HMO | Admitting: Physician Assistant

## 2021-11-19 DIAGNOSIS — E041 Nontoxic single thyroid nodule: Secondary | ICD-10-CM | POA: Diagnosis not present

## 2021-11-22 DIAGNOSIS — R1312 Dysphagia, oropharyngeal phase: Secondary | ICD-10-CM | POA: Diagnosis not present

## 2021-11-22 DIAGNOSIS — R1314 Dysphagia, pharyngoesophageal phase: Secondary | ICD-10-CM | POA: Diagnosis not present

## 2021-11-23 DIAGNOSIS — R1314 Dysphagia, pharyngoesophageal phase: Secondary | ICD-10-CM | POA: Diagnosis not present

## 2021-11-23 DIAGNOSIS — E039 Hypothyroidism, unspecified: Secondary | ICD-10-CM | POA: Diagnosis not present

## 2021-11-23 DIAGNOSIS — I10 Essential (primary) hypertension: Secondary | ICD-10-CM | POA: Diagnosis not present

## 2021-11-23 DIAGNOSIS — R1312 Dysphagia, oropharyngeal phase: Secondary | ICD-10-CM | POA: Diagnosis not present

## 2021-11-23 DIAGNOSIS — K5900 Constipation, unspecified: Secondary | ICD-10-CM | POA: Diagnosis not present

## 2021-11-23 DIAGNOSIS — G8929 Other chronic pain: Secondary | ICD-10-CM | POA: Diagnosis not present

## 2021-11-23 DIAGNOSIS — E119 Type 2 diabetes mellitus without complications: Secondary | ICD-10-CM | POA: Diagnosis not present

## 2021-11-23 DIAGNOSIS — I890 Lymphedema, not elsewhere classified: Secondary | ICD-10-CM | POA: Diagnosis not present

## 2021-11-23 DIAGNOSIS — E1110 Type 2 diabetes mellitus with ketoacidosis without coma: Secondary | ICD-10-CM | POA: Diagnosis not present

## 2021-11-24 DIAGNOSIS — F4321 Adjustment disorder with depressed mood: Secondary | ICD-10-CM | POA: Diagnosis not present

## 2021-11-24 DIAGNOSIS — R1312 Dysphagia, oropharyngeal phase: Secondary | ICD-10-CM | POA: Diagnosis not present

## 2021-11-24 DIAGNOSIS — R1314 Dysphagia, pharyngoesophageal phase: Secondary | ICD-10-CM | POA: Diagnosis not present

## 2021-11-24 DIAGNOSIS — R69 Illness, unspecified: Secondary | ICD-10-CM | POA: Diagnosis not present

## 2021-11-24 DIAGNOSIS — F411 Generalized anxiety disorder: Secondary | ICD-10-CM | POA: Diagnosis not present

## 2021-11-25 DIAGNOSIS — R1312 Dysphagia, oropharyngeal phase: Secondary | ICD-10-CM | POA: Diagnosis not present

## 2021-11-25 DIAGNOSIS — R1314 Dysphagia, pharyngoesophageal phase: Secondary | ICD-10-CM | POA: Diagnosis not present

## 2021-11-26 ENCOUNTER — Encounter: Payer: Medicare HMO | Attending: Physician Assistant | Admitting: Physician Assistant

## 2021-11-26 DIAGNOSIS — L89896 Pressure-induced deep tissue damage of other site: Secondary | ICD-10-CM | POA: Diagnosis not present

## 2021-11-26 DIAGNOSIS — E11622 Type 2 diabetes mellitus with other skin ulcer: Secondary | ICD-10-CM | POA: Diagnosis not present

## 2021-11-26 DIAGNOSIS — R5381 Other malaise: Secondary | ICD-10-CM | POA: Diagnosis not present

## 2021-11-26 DIAGNOSIS — L89153 Pressure ulcer of sacral region, stage 3: Secondary | ICD-10-CM | POA: Insufficient documentation

## 2021-11-26 DIAGNOSIS — L97228 Non-pressure chronic ulcer of left calf with other specified severity: Secondary | ICD-10-CM | POA: Diagnosis not present

## 2021-11-26 DIAGNOSIS — E038 Other specified hypothyroidism: Secondary | ICD-10-CM | POA: Diagnosis not present

## 2021-11-26 DIAGNOSIS — L89893 Pressure ulcer of other site, stage 3: Secondary | ICD-10-CM | POA: Insufficient documentation

## 2021-11-26 DIAGNOSIS — E668 Other obesity: Secondary | ICD-10-CM | POA: Insufficient documentation

## 2021-11-26 DIAGNOSIS — I10 Essential (primary) hypertension: Secondary | ICD-10-CM | POA: Insufficient documentation

## 2021-11-26 DIAGNOSIS — Z7401 Bed confinement status: Secondary | ICD-10-CM | POA: Diagnosis not present

## 2021-11-26 DIAGNOSIS — E1140 Type 2 diabetes mellitus with diabetic neuropathy, unspecified: Secondary | ICD-10-CM | POA: Diagnosis not present

## 2021-11-26 DIAGNOSIS — I890 Lymphedema, not elsewhere classified: Secondary | ICD-10-CM | POA: Insufficient documentation

## 2021-11-26 DIAGNOSIS — R1312 Dysphagia, oropharyngeal phase: Secondary | ICD-10-CM | POA: Diagnosis not present

## 2021-11-26 DIAGNOSIS — L97428 Non-pressure chronic ulcer of left heel and midfoot with other specified severity: Secondary | ICD-10-CM | POA: Diagnosis not present

## 2021-11-26 DIAGNOSIS — L97518 Non-pressure chronic ulcer of other part of right foot with other specified severity: Secondary | ICD-10-CM | POA: Diagnosis not present

## 2021-11-26 DIAGNOSIS — Z6841 Body Mass Index (BMI) 40.0 and over, adult: Secondary | ICD-10-CM | POA: Diagnosis not present

## 2021-11-26 DIAGNOSIS — R1314 Dysphagia, pharyngoesophageal phase: Secondary | ICD-10-CM | POA: Diagnosis not present

## 2021-11-26 DIAGNOSIS — R29898 Other symptoms and signs involving the musculoskeletal system: Secondary | ICD-10-CM | POA: Diagnosis not present

## 2021-11-26 NOTE — Progress Notes (Addendum)
[name] ([id number]) Visit Report for 11/26/2021 Chief Complaint Document Details Patient Name: [name], [name]. Date of Service: 11/26/2021 10:30 AM Medical Record Number: [id number] Patient Account Number: [id number] Date of Birth/Sex: 10-23-1955 (66 y.o. 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 New sacral wound as of 10/29/21 Electronic Signature(s) Signed: 11/26/2021 10:36:10 AM By: Worthy Keeler PA-C Entered By: Worthy Keeler on 11/26/2021 10:36:09 [name] ([id number]) -------------------------------------------------------------------------------- HPI Details Patient Name: [name] Date of Service: 11/26/2021 10:30 AM Medical Record Number: [id number] Patient Account Number: [id number] Date of Birth/Sex: July 11, 1955 (66 y.o. F) Treating RN: Cornell Barman Primary Care Provider: [name] Other Clinician: Referring Provider: [name] Treating Provider/Extender: Skipper Cliche in Treatment: 25 History of Present Illness HPI Description: 66yo w/ h/o BLE phlebolymphedema, type 2 DM (unknown hemoglobin A1c), and obesity. No PVD. h/o chronic, recurrent BLE calf ulcers. Treated with 4 layer compression. Infrequently uses lymphedema pump. Bilateral lower extremity ulcerations healed as of June 2016. Fitted for custom compression stockings but did not receive them. Patient could not travel for lymphedema consult. She developed recurrent bilateral lower extremity ulcerations in August 2016. She is able to heal these quite quickly with 4 layer elastic compression bandages. However, she is not very compliant using her juxtalite compression garments and tends to develop recurrent ulcerations while  using juxtalite. Unna boots were applied this past week. She tolerated this well but performs minimal ambulation. She is scheduled to undergo total hip replacement tomorrow with Dr. Rudene Christians. She is unsure if he is aware of her ulcerations. She is otherwise without complaints. No significant pain. No fever or chills. Moderate clear drainage. Not on any antibiotics. 06/04/2015 -- she did have her hip surgery and has now been in rehabilitation and they're controlling her fluid intake and diuretics to the extent that she has lost 13 pounds of weight and her lower extremity circumference is decreased by 7 cm. 06/18/2015 -- she is still in the rehabilitation facility and continues to be looked after well and has lost a total of 15 pounds and her diabetes is under much better control. Readmission: 06/03/2021 upon evaluation today patient actually appears to be doing somewhat poorly in regard to a wound on the right posterior lower leg. Patient does have a history of previously having been seen here in the clinic but this predates my time here back in 2017. Subsequently unfortunately right now she is having a significant issue with bleeding in the posterior of her right calf this appears to be a deep tissue injury as developed into an ulceration. She does have a history significant for lymphedema, diabetes mellitus type 2, diabetic neuropathy, hypertension, hypothyroidism, and obesity. She is currently in a skilled nursing facility. The dressing that she had in place during evaluation today was Xeroform followed by significant amount of gauze and this was severely stuck. Upon removal the patient was having significant bleeding she also had significant work done in the hospital in regard to her blood flow and Dr. Lucky Cowboy does look like he has her on 2 blood thinners Plavix and Lovenox. This is probably part of the reason why she is bleeding so significantly. With that being said I do believe that based on what we  are seeing currently this is probably can to be something that were getting  have to try to get stopped with more than just pressure to the region currently. I think surgery foam is probably can to be necessary. If we can get the stop of the Surgifoam she is probably can have to go to the hospital to try to get this bleeding stopped. 12/30; this is a patient who is currently in a skilled facility. She has wounds on her bilateral calfs likely secondary to severe chronic venous insufficiency and lymphedema the care of these wounds is complicated by the fact that she is on Lovenox and Plavix and bleeds very frequently with even minimal trauma to these wounds. It would appear that she had a Vaseline gauze on the surface of these in the process of undressing them she bled freely from especially the right but also the left. I had to go in and use silver nitrate. The skin on her legs is significant for chronic venous stasis and lymphedema. Very dry flaking skin 07/16/2021; I actually was a patent person who saw this woman 3 weeks ago. She is a nursing facility has chronic venous wounds and bilateral lower extremity wounds. We use Xeroform under compression. The time she was on Lovenox and Plavix I thought I had an excellent reason for these wounds to bleed so freely. Since then the Lovenox has been stopped and should be well out of her system but she is still on Plavix. She still has very friable bleeding wounds on the left lateral left Posterior and right Posterior lower extremity.These believe very freely when we take off the dressing Also she has a left heel wound which when we saw her last time was a surgical wound however I gather we have been asked to follow this as well. I am not exactly sure at this time what her surgery was but she comes in with a wound VAC on this and they are apparently changing it at the nursing home. 08/27/2021 upon evaluation patient appears to be doing better in regard to her  wound on the legs as well as the heel. Fortunately at this point I do not see any need for a wound VAC which is good news overall I think that the Xeroform is probably a good option that is what is being utilized here as well. Fortunately I do not see any signs of active infection locally nor systemically which is great news and overall I think that the patient is making excellent progress. The area on her right gluteal area actually appears to be doing quite well and I am pleased with that. I do believe the silver alginate dressing here could be of benefit for her. 09/17/2021 upon evaluation patient appears to be doing actually better in regard to her wounds on the legs. Fortunately she does not seem to be showing any signs of worsening which is great news and overall very pleased with where we stand. I do believe that she is tolerating the dressing changes without complication which is great news. 10-08-2021 upon evaluation today patient unfortunately continues to have significant issues with her legs. Unfortunately the biggest issue that we see Currently is that she is still having a lot of bleeding from her legs that she is also not able to move well due to her knee pain her left knee is quite significant as far as the discomfort is concerned and the right knee is getting equally as bad. Fortunately I do not see any evidence of active infection locally or systemically which is great news. Nonetheless I  do believe that her heels are looking much better there is a tiny area open on Echavarria, Naviyah K. ([id number]) the left heel the right is doing quite well. In regard to the gluteal/sacral region this appears to be healed. 10-29-2021 upon evaluation today patient unfortunately is appearing to do much worse compared to normal. She has more breakdown in regard to the left lateral leg and again I think this is pressure related. She also has more breakdown in regard to the gluteal area and she is very  concerned about this in particular which is completely understandable. With that being said I do believe that she would benefit from more aggressive and appropriate offloading I discussed with her that she needs to allow the facility to change her position every 2 hours and she is can have to find different positions other than just lying on her back. Also discussed with her that obviously the facility needs to make sure to help her with repositioning every 2 hours I think both aspects of this are important. Otherwise her wound on the sacral area is going to continue to get worse and the leg will continue to get worse. 11-26-2021 upon evaluation today patient appears to be doing well with regard to her wounds all things considered. The gluteal areas are all healed which is great news. With that being said she is still having some issues here with her lower extremities. I do think that this is still something that we need to be very mindful of. Unfortunately the facility is still not washing her legs she has a lot of caked on dry skin with Xeroform we will get a try to get some of that off today. With that being said he does have reiterated in the past the patient needs to have her legs washed regularly. Electronic Signature(s) Signed: 11/26/2021 11:37:46 AM By: Worthy Keeler PA-C Entered By: Worthy Keeler on 11/26/2021 11:37:46 [name] ([id number]) -------------------------------------------------------------------------------- Physical Exam Details Patient Name: [name] Date of Service: 11/26/2021 10:30 AM Medical Record Number: [id number] Patient Account Number: [id number] Date of Birth/Sex: May 29, 1956 (66 y.o. F) Treating RN: Cornell Barman Primary Care Provider: [name] Other Clinician: Referring Provider: [name] Treating Provider/Extender: Jeri Cos Weeks in Treatment: 25 Constitutional Obese and well-hydrated in no acute distress. Respiratory normal  breathing without difficulty. Psychiatric this patient is able to make decisions and demonstrates good insight into disease process. Alert and Oriented x 3. pleasant and cooperative. Notes Upon inspection patient's wounds do show some signs of improvement there is definitely no new injury that I am seeing anywhere other than on the right heel where there is a small area of deep tissue injury hopefully this will not open but nonetheless it is something that we need to keep a close eye on. Otherwise I think she is doing well with Xeroform. Electronic Signature(s) Signed: 11/26/2021 11:38:08 AM By: Worthy Keeler PA-C Entered By: Worthy Keeler on 11/26/2021 11:38:08 [name] ([id number]) -------------------------------------------------------------------------------- Physician Orders Details Patient Name: [name] Date of Service: 11/26/2021 10:30 AM Medical Record Number: [id number] Patient Account Number: [id number] Date of Birth/Sex: [birth year] (66 y.o. F) Treating RN: Alycia Rossetti Primary Care Provider: [name] Other Clinician: Referring Provider: [name] Treating Provider/Extender: Skipper Cliche in Treatment: 25 Verbal / Phone Orders: No Diagnosis Coding ICD-10 Coding Code Description I89.0 Lymphedema, not elsewhere classified E11.622 Type 2 diabetes mellitus with other skin ulcer E11.40 Type 2 diabetes mellitus with diabetic neuropathy, unspecified  J28.786 Pressure ulcer of other site, stage 3 L89.896 Pressure-induced deep tissue damage of other site L89.153 Pressure ulcer of sacral region, stage 3 I10 Essential (primary) hypertension E03.8 Other specified hypothyroidism E66.09 Other obesity due to excess calories Follow-up Appointments Wound #60 Right,Posterior Lower Leg o Return Appointment in 1 month Bathing/ Shower/ Hygiene o Wash wounds with antibacterial soap and water. - WASH LEGS WITH SOAP and WATER before applying dressings. o  No tub bath. Edema Control - Lymphedema / Segmental Compressive Device / Other o Other: - Lymphedema-use ace wraps to help control swelling. Off-Loading o Turn and reposition every 2 hours - PLEASE MAKE SURE THIS IS COMPLETED EVERY TWO HOURS THANK YOU o Other: - Put pillows under knees to keep pressure off of calves. Deep tissue injury on posterior calves. Additional Orders / Instructions o Follow Nutritious Diet and Increase Protein Intake Wound Treatment Wound #60 - Lower Leg Wound Laterality: Right, Posterior Cleanser: Normal Saline 3 x Per Week/30 Days Discharge Instructions: Wash your hands with soap and water. Remove old dressing, discard into plastic bag and place into trash. Cleanse the wound with Normal Saline prior to applying a clean dressing using gauze sponges, not tissues or cotton balls. Do not scrub or use excessive force. Pat dry using gauze sponges, not tissue or cotton balls. Primary Dressing: Xeroform 5x9-HBD (in/in) 3 x Per Week/30 Days Discharge Instructions: 1. Apply Xeroform 5x9-HBD (in/in) as directed Secondary Dressing: ABD Pad 5x9 (in/in) 3 x Per Week/30 Days Discharge Instructions: 2. Cover with ABD pad Secured With: ACE WRAP - 18M ACE Elastic Bandage With VELCRO Brand Closure, 4 (in) 3 x Per Week/30 Days Discharge Instructions: 4. Ace wrap to secure dressing in place and control swelling. Secured With: [employer] or Non-Sterile 6-ply 4.5x4 (yd/yd) 3 x Per Week/30 Days Discharge Instructions: 3. Apply Kerlix as directed Wound #61 - Lower Leg Wound Laterality: Left, Posterior Cleanser: Normal Saline 3 x Per Week/30 Days [name], [name] ([id number]) Discharge Instructions: Wash your hands with soap and water. Remove old dressing, discard into plastic bag and place into trash. Cleanse the wound with Normal Saline prior to applying a clean dressing using gauze sponges, not tissues or cotton balls. Do not scrub or use excessive force. Pat dry using  gauze sponges, not tissue or cotton balls. Primary Dressing: Xeroform 5x9-HBD (in/in) 3 x Per Week/30 Days Discharge Instructions: 1. Apply Xeroform 5x9-HBD (in/in) as directed Secondary Dressing: ABD Pad 5x9 (in/in) 3 x Per Week/30 Days Discharge Instructions: 2. Cover with ABD pad Secured With: ACE WRAP - 18M ACE Elastic Bandage With VELCRO Brand Closure, 4 (in) 3 x Per Week/30 Days Discharge Instructions: 4. Ace wrap to secure dressing in place and control swelling. Secured With: [employer] or Non-Sterile 6-ply 4.5x4 (yd/yd) 3 x Per Week/30 Days Discharge Instructions: 3. Apply Kerlix as directed Wound #62 - Lower Leg Wound Laterality: Left, Midline, Posterior Cleanser: Normal Saline 3 x Per Week/30 Days Discharge Instructions: Wash your hands with soap and water. Remove old dressing, discard into plastic bag and place into trash. Cleanse the wound with Normal Saline prior to applying a clean dressing using gauze sponges, not tissues or cotton balls. Do not scrub or use excessive force. Pat dry using gauze sponges, not tissue or cotton balls. Primary Dressing: Xeroform 5x9-HBD (in/in) 3 x Per Week/30 Days Discharge Instructions: 1. Apply Xeroform 5x9-HBD (in/in) as directed Secondary Dressing: ABD Pad 5x9 (in/in) 3 x Per Week/30 Days Discharge Instructions: 2. Cover with ABD pad  Secured With: ACE WRAP - 67M ACE Elastic Bandage With VELCRO Brand Closure, 4 (in) 3 x Per Week/30 Days Discharge Instructions: 4. Ace wrap to secure dressing in place and control swelling. Secured With: [employer] or Non-Sterile 6-ply 4.5x4 (yd/yd) 3 x Per Week/30 Days Discharge Instructions: 3. Apply Kerlix as directed Electronic Signature(s) Signed: 11/26/2021 4:26:56 PM By: Carlene Coria RN Signed: 11/26/2021 4:36:57 PM By: Worthy Keeler PA-C Entered By: Carlene Coria on 11/26/2021 11:44:35 [name]  ([id number]) -------------------------------------------------------------------------------- Problem List Details Patient Name: [name] Date of Service: 11/26/2021 10:30 AM Medical Record Number: [id number] Patient Account Number: [id number] Date of Birth/Sex: Jul 14, 1955 (66 y.o. F) Treating RN: Cornell Barman Primary Care Provider: [name] Other Clinician: Referring Provider: [name] Treating Provider/Extender: Skipper Cliche in Treatment: 25 Active Problems ICD-10 Encounter Code Description Active Date MDM Diagnosis I89.0 Lymphedema, not elsewhere classified 06/03/2021 No Yes E11.622 Type 2 diabetes mellitus with other skin ulcer 06/03/2021 No Yes E11.40 Type 2 diabetes mellitus with diabetic neuropathy, unspecified 06/03/2021 No Yes L89.893 Pressure ulcer of other site, stage 3 06/03/2021 No Yes L89.896 Pressure-induced deep tissue damage of other site 06/03/2021 No Yes L89.153 Pressure ulcer of sacral region, stage 3 08/27/2021 No Yes I10 Essential (primary) hypertension 06/03/2021 No Yes E03.8 Other specified hypothyroidism 06/03/2021 No Yes E66.09 Other obesity due to excess calories 06/03/2021 No Yes Inactive Problems Resolved Problems Electronic Signature(s) Signed: 11/26/2021 10:36:06 AM By: Worthy Keeler PA-C Entered By: Worthy Keeler on 11/26/2021 10:36:06 [name] ([id number]) -------------------------------------------------------------------------------- Progress Note Details Patient Name: [name]. Date of Service: 11/26/2021 10:30 AM Medical Record Number: [id number] Patient Account Number: [id number] Date of Birth/Sex: 1955-08-25 (66 y.o. F) Treating RN: Cornell Barman Primary Care Provider: [name] Other Clinician: Referring Provider: [name] Treating Provider/Extender: Skipper Cliche in Treatment: 25 Subjective Chief Complaint Information obtained from Patient Bilateral lower extremity phlebolymphedema and recurrent  calf ulcerations. New sacral wound as of 10/29/21 History of Present Illness (HPI) 66yo w/ h/o BLE phlebolymphedema, type 2 DM (unknown hemoglobin A1c), and obesity. No PVD. h/o chronic, recurrent BLE calf ulcers. Treated with 4 layer compression. Infrequently uses lymphedema pump. Bilateral lower extremity ulcerations healed as of June 2016. Fitted for custom compression stockings but did not receive them. Patient could not travel for lymphedema consult. She developed recurrent bilateral lower extremity ulcerations in August 2016. She is able to heal these quite quickly with 4 layer elastic compression bandages. However, she is not very compliant using her juxtalite compression garments and tends to develop recurrent ulcerations while using juxtalite. Unna boots were applied this past week. She tolerated this well but performs minimal ambulation. She is scheduled to undergo total hip replacement tomorrow with Dr. Rudene Christians. She is unsure if he is aware of her ulcerations. She is otherwise without complaints. No significant pain. No fever or chills. Moderate clear drainage. Not on any antibiotics. 06/04/2015 -- she did have her hip surgery and has now been in rehabilitation and they're controlling her fluid intake and diuretics to the extent that she has lost 13 pounds of weight and her lower extremity circumference is decreased by 7 cm. 06/18/2015 -- she is still in the rehabilitation facility and continues to be looked after well and has lost a total of 15 pounds and her diabetes is under much better control. Readmission: 06/03/2021 upon evaluation today patient actually appears to be doing somewhat poorly in regard to a wound on the right posterior lower leg.  Patient does have a history of previously having been seen here in the clinic but this predates my time here back in 2017. Subsequently unfortunately right now she is having a significant issue with bleeding in the posterior of her right calf  this appears to be a deep tissue injury as developed into an ulceration. She does have a history significant for lymphedema, diabetes mellitus type 2, diabetic neuropathy, hypertension, hypothyroidism, and obesity. She is currently in a skilled nursing facility. The dressing that she had in place during evaluation today was Xeroform followed by significant amount of gauze and this was severely stuck. Upon removal the patient was having significant bleeding she also had significant work done in the hospital in regard to her blood flow and Dr. Lucky Cowboy does look like he has her on 2 blood thinners Plavix and Lovenox. This is probably part of the reason why she is bleeding so significantly. With that being said I do believe that based on what we are seeing currently this is probably can to be something that were getting have to try to get stopped with more than just pressure to the region currently. I think surgery foam is probably can to be necessary. If we can get the stop of the Surgifoam she is probably can have to go to the hospital to try to get this bleeding stopped. 12/30; this is a patient who is currently in a skilled facility. She has wounds on her bilateral calfs likely secondary to severe chronic venous insufficiency and lymphedema the care of these wounds is complicated by the fact that she is on Lovenox and Plavix and bleeds very frequently with even minimal trauma to these wounds. It would appear that she had a Vaseline gauze on the surface of these in the process of undressing them she bled freely from especially the right but also the left. I had to go in and use silver nitrate. The skin on her legs is significant for chronic venous stasis and lymphedema. Very dry flaking skin 07/16/2021; I actually was a patent person who saw this woman 3 weeks ago. She is a nursing facility has chronic venous wounds and bilateral lower extremity wounds. We use Xeroform under compression. The time she was  on Lovenox and Plavix I thought I had an excellent reason for these wounds to bleed so freely. Since then the Lovenox has been stopped and should be well out of her system but she is still on Plavix. She still has very friable bleeding wounds on the left lateral left Posterior and right Posterior lower extremity.These believe very freely when we take off the dressing Also she has a left heel wound which when we saw her last time was a surgical wound however I gather we have been asked to follow this as well. I am not exactly sure at this time what her surgery was but she comes in with a wound VAC on this and they are apparently changing it at the nursing home. 08/27/2021 upon evaluation patient appears to be doing better in regard to her wound on the legs as well as the heel. Fortunately at this point I do not see any need for a wound VAC which is good news overall I think that the Xeroform is probably a good option that is what is being utilized here as well. Fortunately I do not see any signs of active infection locally nor systemically which is great news and overall I think that the patient is making excellent progress.  The area on her right gluteal area actually appears to be doing quite well and I am pleased with that. I do believe the silver alginate dressing here could be of benefit for her. 09/17/2021 upon evaluation patient appears to be doing actually better in regard to her wounds on the legs. Fortunately she does not seem to be showing any signs of worsening which is great news and overall very pleased with where we stand. I do believe that she is tolerating the dressing changes without complication which is great news. [name], [name] ([id number]) 10-08-2021 upon evaluation today patient unfortunately continues to have significant issues with her legs. Unfortunately the biggest issue that we see Currently is that she is still having a lot of bleeding from her legs that she is also not  able to move well due to her knee pain her left knee is quite significant as far as the discomfort is concerned and the right knee is getting equally as bad. Fortunately I do not see any evidence of active infection locally or systemically which is great news. Nonetheless I do believe that her heels are looking much better there is a tiny area open on the left heel the right is doing quite well. In regard to the gluteal/sacral region this appears to be healed. 10-29-2021 upon evaluation today patient unfortunately is appearing to do much worse compared to normal. She has more breakdown in regard to the left lateral leg and again I think this is pressure related. She also has more breakdown in regard to the gluteal area and she is very concerned about this in particular which is completely understandable. With that being said I do believe that she would benefit from more aggressive and appropriate offloading I discussed with her that she needs to allow the facility to change her position every 2 hours and she is can have to find different positions other than just lying on her back. Also discussed with her that obviously the facility needs to make sure to help her with repositioning every 2 hours I think both aspects of this are important. Otherwise her wound on the sacral area is going to continue to get worse and the leg will continue to get worse. 11-26-2021 upon evaluation today patient appears to be doing well with regard to her wounds all things considered. The gluteal areas are all healed which is great news. With that being said she is still having some issues here with her lower extremities. I do think that this is still something that we need to be very mindful of. Unfortunately the facility is still not washing her legs she has a lot of caked on dry skin with Xeroform we will get a try to get some of that off today. With that being said he does have reiterated in the past the patient needs to  have her legs washed regularly. Objective Constitutional Obese and well-hydrated in no acute distress. Vitals Time Taken: 10:54 AM, Height: 65 in, Weight: 245.4 lbs, BMI: 40.8, Temperature: 98.2 F, Pulse: 78 bpm, Respiratory Rate: 16 breaths/min, Blood Pressure: 122/63 mmHg. Respiratory normal breathing without difficulty. Psychiatric this patient is able to make decisions and demonstrates good insight into disease process. Alert and Oriented x 3. pleasant and cooperative. General Notes: Upon inspection patient's wounds do show some signs of improvement there is definitely no new injury that I am seeing anywhere other than on the right heel where there is a small area of deep tissue injury hopefully this will  not open but nonetheless it is something that we need to keep a close eye on. Otherwise I think she is doing well with Xeroform. Integumentary (Hair, Skin) Wound #60 status is Open. Original cause of wound was Pressure Injury. The date acquired was: 05/17/2021. The wound has been in treatment 25 weeks. The wound is located on the Right,Posterior Lower Leg. The wound measures 13cm length x 12cm width x 0.1cm depth; 122.522cm^2 area and 12.252cm^3 volume. There is a large amount of sanguinous drainage noted. Wound #61 status is Open. Original cause of wound was Gradually Appeared. The date acquired was: 06/11/2021. The wound has been in treatment 22 weeks. The wound is located on the Left,Posterior Lower Leg. The wound measures 13cm length x 6cm width x 0.3cm depth; 61.261cm^2 area and 18.378cm^3 volume. There is a large amount of serosanguineous drainage noted. Wound #62 status is Open. Original cause of wound was Gradually Appeared. The date acquired was: 06/11/2021. The wound has been in treatment 22 weeks. The wound is located on the Left,Midline,Posterior Lower Leg. The wound measures 7.5cm length x 3cm width x 0.1cm depth; 17.671cm^2 area and 1.767cm^3 volume. There is a large amount  of sanguinous drainage noted. Wound #63 status is Open. Original cause of wound was Gradually Appeared. The date acquired was: 04/27/2021. The wound has been in treatment 19 weeks. The wound is located on the Left Calcaneus. The wound measures 0.1cm length x 0.1cm width x 0.1cm depth; 0.008cm^2 area and 0.001cm^3 volume. There is a medium amount of serosanguineous drainage noted. Wound #65 status is Open. Original cause of wound was Other Lesion. The date acquired was: 10/04/2021. The wound has been in treatment 7 weeks. The wound is located on the Right,Lateral Foot. The wound measures 0.1cm length x 0.1cm width x 0.1cm depth; 0.008cm^2 area and 0.001cm^3 volume. There is a none present amount of drainage noted. Wound #66 status is Open. Original cause of wound was Pressure Injury. The date acquired was: 10/08/2021. The wound has been in treatment 4 weeks. The wound is located on the Midline Sacrum. The wound measures 0cm length x 0cm width x 0cm depth; 0cm^2 area and 0cm^3 volume. There is Fat Layer (Subcutaneous Tissue) exposed. There is a medium amount of serosanguineous drainage noted. There is small (1-33%) red granulation within the wound bed. There is a large (67-100%) amount of necrotic tissue within the wound bed including Adherent Slough. [name], [name] ([id number]) Assessment Active Problems ICD-10 Lymphedema, not elsewhere classified Type 2 diabetes mellitus with other skin ulcer Type 2 diabetes mellitus with diabetic neuropathy, unspecified Pressure ulcer of other site, stage 3 Pressure-induced deep tissue damage of other site Pressure ulcer of sacral region, stage 3 Essential (primary) hypertension Other specified hypothyroidism Other obesity due to excess calories Plan 1. I would recommend that we going continue with the wound care measures as before and the patient is in agreement with the plan. This includes the use of Xeroform gauze to pretty much all areas of her legs I  think this is good to be the best option for [name] currently. 2. I am also going to reiterate that the patient needs to have her legs washed on a regular basis. That would help in so many ways with what was seen and going on at this point. We will see patient back for reevaluation in 1 week here in the clinic. If anything worsens or changes patient will contact our office for additional recommendations. Electronic Signature(s) Signed: 11/26/2021 11:38:47 AM By: Joaquim Lai  III, Jazzmine Kleiman PA-C Entered By: Worthy Keeler on 11/26/2021 11:38:47 [name] ([id number]) -------------------------------------------------------------------------------- SuperBill Details Patient Name: [name] Date of Service: 11/26/2021 Medical Record Number: [id number] Patient Account Number: [id number] Date of Birth/Sex: [birth year] (66 y.o. F) Treating RN: Cornell Barman Primary Care Provider: [name] Other Clinician: Referring Provider: [name] Treating Provider/Extender: Jeri Cos Weeks in Treatment: 25 Diagnosis Coding ICD-10 Codes Code Description I89.0 Lymphedema, not elsewhere classified E11.622 Type 2 diabetes mellitus with other skin ulcer E11.40 Type 2 diabetes mellitus with diabetic neuropathy, unspecified L89.893 Pressure ulcer of other site, stage 3 L89.896 Pressure-induced deep tissue damage of other site L89.153 Pressure ulcer of sacral region, stage 3 I10 Essential (primary) hypertension E03.8 Other specified hypothyroidism E66.09 Other obesity due to excess calories Facility Procedures CPT4 Code: [id number] Description: [phone] - WOUND CARE VISIT-LEV 5 EST PT Modifier: Quantity: 1 Physician Procedures CPT4 Code: [id number] Description: 57017 - WC PHYS LEVEL 3 - EST PT Modifier: Quantity: 1 CPT4 Code: Description: ICD-10 Diagnosis Description I89.0 Lymphedema, not elsewhere classified E11.622 Type 2 diabetes mellitus with other skin ulcer E11.40 Type 2 diabetes mellitus with  diabetic neuropathy, unspecified L89.893 Pressure ulcer of other site, stage  3 Modifier: Quantity: Electronic Signature(s) Signed: 11/26/2021 4:28:01 PM By: Alycia Rossetti Signed: 11/26/2021 4:36:57 PM By: Worthy Keeler PA-C Previous Signature: 11/26/2021 11:40:17 AM Version By: Worthy Keeler PA-C Entered By: Alycia Rossetti on 11/26/2021 11:44:09

## 2021-11-26 NOTE — Progress Notes (Addendum)
MYLEKA, MONCURE ([id number]) Visit Report for 11/26/2021 Arrival Information Details Patient Name: [name], [name]. Date of Service: 11/26/2021 10:30 AM Medical Record Number: [id number] Patient Account Number: [id number] Date of Birth/Sex: Nov 20, 1955 (66 y.o. F) Treating RN: Alycia Rossetti Primary Care Divya Munshi: [name] Other Clinician: Referring Matti Minney: [name] Treating Hortensia Duffin/Extender: Skipper Cliche in Treatment: 25 Visit Information History Since Last Visit Added or deleted any medications: No Patient Arrived: Stretcher Any new allergies or adverse reactions: No Arrival Time: 10:52 Had a fall or experienced change in No Accompanied By: EMT activities of daily living that may affect Transfer Assistance: Stretcher risk of falls: Patient Requires Transmission-Based No Pain Present Now: Yes Precautions: Patient Has Alerts: Yes Patient Alerts: Patient on Blood Thinner TYPE II Diabetic Plavix and Lovenox [place] Signature(s) Signed: 11/26/2021 4:28:01 PM By: Alycia Rossetti Entered By: Alycia Rossetti on 11/26/2021 10:54:10 [name] ([id number]) -------------------------------------------------------------------------------- Clinic Level of Care Assessment Details Patient Name: [name]. Date of Service: 11/26/2021 10:30 AM Medical Record Number: [id number] Patient Account Number: [id number] Date of Birth/Sex: [birth year] (66 y.o. F) Treating RN: Alycia Rossetti Primary Care Saryn Cherry: [name] Other Clinician: Referring Jahnasia Tatum: [name] Treating Delando Satter/Extender: Skipper Cliche in Treatment: 25 Clinic Level of Care Assessment Items TOOL 4 Quantity Score []  - Use when only an EandM is performed on FOLLOW-UP visit 0 ASSESSMENTS - Nursing Assessment / Reassessment X - Reassessment of Co-morbidities (includes updates in patient status) 1 10 X- 1 5 Reassessment of Adherence to Treatment Plan ASSESSMENTS -  Wound and Skin Assessment / Reassessment []  - Simple Wound Assessment / Reassessment - one wound 0 X- 6 5 Complex Wound Assessment / Reassessment - multiple wounds []  - 0 Dermatologic / Skin Assessment (not related to wound area) ASSESSMENTS - Focused Assessment []  - Circumferential Edema Measurements - multi extremities 0 []  - 0 Nutritional Assessment / Counseling / Intervention []  - 0 Lower Extremity Assessment (monofilament, tuning fork, pulses) []  - 0 Peripheral Arterial Disease Assessment (using hand held doppler) ASSESSMENTS - Ostomy and/or Continence Assessment and Care []  - Incontinence Assessment and Management 0 []  - 0 Ostomy Care Assessment and Management (repouching, etc.) PROCESS - Coordination of Care X - Simple Patient / Family Education for ongoing care 1 15 []  - 0 Complex (extensive) Patient / Family Education for ongoing care X- 1 10 Staff obtains Consents, Records, Test Results / Process Orders []  - 0 Staff telephones HHA, Nursing Homes / Clarify orders / etc []  - 0 Routine Transfer to another Facility (non-emergent condition) []  - 0 Routine Hospital Admission (non-emergent condition) []  - 0 New Admissions / Biomedical engineer / Ordering NPWT, Apligraf, etc. []  - 0 Emergency Hospital Admission (emergent condition) X- 1 10 Simple Discharge Coordination []  - 0 Complex (extensive) Discharge Coordination PROCESS - Special Needs []  - Pediatric / Minor Patient Management 0 []  - 0 Isolation Patient Management []  - 0 Hearing / Language / Visual special needs []  - 0 Assessment of Community assistance (transportation, D/C planning, etc.) []  - 0 Additional assistance / Altered mentation []  - 0 Support Surface(s) Assessment (bed, cushion, seat, etc.) INTERVENTIONS - Wound Cleansing / Measurement Woo, Lashay K. ([id number]) []  - 0 Simple Wound Cleansing - one wound X- 6 5 Complex Wound Cleansing - multiple wounds X- 1 5 Wound Imaging  (photographs - any number of wounds) []  - 0 Wound Tracing (instead of photographs) []  - 0 Simple Wound Measurement - one wound X- 3 5 Complex Wound  Measurement - multiple wounds INTERVENTIONS - Wound Dressings []  - Small Wound Dressing one or multiple wounds 0 []  - 0 Medium Wound Dressing one or multiple wounds X- 2 20 Large Wound Dressing one or multiple wounds []  - 0 Application of Medications - topical []  - 0 Application of Medications - injection INTERVENTIONS - Miscellaneous []  - External ear exam 0 []  - 0 Specimen Collection (cultures, biopsies, blood, body fluids, etc.) []  - 0 Specimen(s) / Culture(s) sent or taken to Lab for analysis []  - 0 Patient Transfer (multiple staff / Civil Service fast streamer / Similar devices) []  - 0 Simple Staple / Suture removal (25 or less) []  - 0 Complex Staple / Suture removal (26 or more) []  - 0 Hypo / Hyperglycemic Management (close monitor of Blood Glucose) []  - 0 Ankle / Brachial Index (ABI) - do not check if billed separately X- 1 5 Vital Signs Has the patient been seen at the hospital within the last three years: Yes Total Score: 175 Level Of Care: New/Established - Level 5 Electronic Signature(s) Signed: 11/26/2021 4:28:01 PM By: Alycia Rossetti Entered By: Alycia Rossetti on 11/26/2021 11:43:21 [name] ([id number]) -------------------------------------------------------------------------------- Lower Extremity Assessment Details Patient Name: [name]. Date of Service: 11/26/2021 10:30 AM Medical Record Number: [id number] Patient Account Number: [id number] Date of Birth/Sex: 07/23/1955 (66 y.o. F) Treating RN: Alycia Rossetti Primary Care Emiah Pellicano: [name] Other Clinician: Referring Demetrius Mahler: [name] Treating Wray Goehring/Extender: Jeri Cos Weeks in Treatment: 25 Vascular Assessment Pulses: Dorsalis Pedis Palpable: [Left:Yes] [Right:Yes] Electronic Signature(s) Signed: 11/26/2021 4:28:01 PM By: Alycia Rossetti Entered By: Alycia Rossetti on 11/26/2021 11:38:56 [name] ([id number]) -------------------------------------------------------------------------------- Multi Wound Chart Details Patient Name: [name] Date of Service: 11/26/2021 10:30 AM Medical Record Number: [id number] Patient Account Number: [id number] Date of Birth/Sex: 1955-09-16 (66 y.o. F) Treating RN: Alycia Rossetti Primary Care Naveyah Iacovelli: [name] Other Clinician: Referring Davell Beckstead: [name] Treating Junius Faucett/Extender: Jeri Cos Weeks in Treatment: 25 Vital Signs Height(in): 65 Pulse(bpm): 78 Weight(lbs): 245.4 Blood Pressure(mmHg): 122/63 Body Mass Index(BMI): 40.8 Temperature(F): 98.2 Respiratory Rate(breaths/min): 16 Photos: [60:No Photos] [61:No Photos] [62:No Photos] Wound Location: [60:Right, Posterior Lower Leg] [61:Left, Posterior Lower Leg] [62:Left, Midline, Posterior Lower Leg] Wounding Event: [60:Pressure Injury] [61:Gradually Appeared] [62:Gradually Appeared] Primary Etiology: [60:Pressure Ulcer] [61:Lymphedema] [62:Lymphedema] Secondary Etiology: [60:N/A] [61:N/A] [62:N/A] Comorbid History: [60:N/A] [61:N/A] [62:N/A] Date Acquired: [60:05/17/2021] [61:06/11/2021] [62:06/11/2021] Weeks of Treatment: [60:25] [61:22] [62:22] Wound Status: [60:Open] [61:Open] [62:Open] Wound Recurrence: [60:No] [61:No] [62:No] Clustered Wound: [60:No] [61:No] [62:No] Clustered Quantity: [60:N/A] [61:N/A] [62:N/A] Measurements L x W x D (cm) [60:13x12x0.1] [61:13x6x0.3] [62:7.5x3x0.1] Area (cm) : [60:122.522] [61:61.261] [53:66.440] Volume (cm) : [60:12.252] [61:18.378] [62:1.767] % Reduction in Area: [60:-21585.30%] [61:-92.60%] [62:-1789.90%] % Reduction in Volume: [60:-21394.70%] [61:-477.70%] [62:-1800.00%] Classification: [60:Category/Stage III] [61:Full Thickness With Exposed Support Structures] [62:Full Thickness Without Exposed Support Structures] Exudate Amount: [60:Large]  [61:Large] [62:Large] Exudate Type: [60:Sanguinous] [61:Serosanguineous] [62:Sanguinous] Exudate Color: [60:red] [61:red, brown] [62:red] Granulation Amount: [60:N/A] [61:N/A] [62:N/A] Granulation Quality: [60:N/A] [61:N/A] [62:N/A] Necrotic Amount: [60:N/A N/A] [61:N/A N/A] [62:N/A N/A] Wound Number: [id number] Photos: No Photos No Photos Wound Location: Left Calcaneus Right, Lateral Foot Midline Sacrum Wounding Event: Gradually Appeared Other Lesion Pressure Injury Primary Etiology: Open Surgical Wound 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 ([id number]) Wound Recurrence: No No No Clustered Wound: No No Yes Clustered Quantity: N/A N/A 2 Measurements L x W x D (cm) 0x0x0 0x0x0 0x0x0 Area (cm) : 0 0 0 Volume (cm) : 0 0 0 % Reduction in Area: 100.00% 100.00% 100.00% % Reduction in Volume: 100.00% 100.00% 100.00% Classification: Full Thickness Without Exposed Grade 0 Category/Stage III Support Structures Exudate Amount: Medium None Present Medium Exudate Type: Serosanguineous N/A Serosanguineous Exudate Color: red, brown N/A red, brown Granulation Amount: N/A N/A Small (1-33%) Granulation Quality: N/A N/A Red Necrotic Amount: N/A N/A Large (67-100%) Epithelialization: N/A N/A None Treatment Notes Electronic Signature(s) Signed: 11/26/2021 4:28:01 PM By: Alycia Rossetti Entered By: Alycia Rossetti on 11/26/2021 11:39:57 [name] ([id number]) -------------------------------------------------------------------------------- Multi-Disciplinary Care Plan  Details Patient Name: [name] Date of Service: 11/26/2021 10:30 AM Medical Record Number: [id number] Patient Account Number: [id number] Date of Birth/Sex: 1956-04-15 (66 y.o. F) Treating RN: Alycia Rossetti Primary Care Athalee Esterline: [name] Other Clinician: Referring Starleen Trussell: [name] Treating Calisa Luckenbaugh/Extender: Skipper Cliche in Treatment: 25 Active Inactive Necrotic Tissue Nursing Diagnoses: Impaired tissue integrity related to necrotic/devitalized tissue Knowledge deficit related to management of necrotic/devitalized tissue Goals: Necrotic/devitalized tissue will be minimized in the wound bed Date Initiated: 11/26/2021 Target Resolution Date: 11/26/2021 Goal Status: Active Patient/caregiver will verbalize understanding of reason and process for debridement of necrotic tissue Date Initiated: 11/26/2021 Target Resolution Date: 11/26/2021 Goal Status: Active Interventions: Assess patient pain level pre-, during and post procedure and prior to discharge Provide education on necrotic tissue and debridement process Treatment Activities: Apply topical anesthetic as ordered : 11/26/2021 Notes: Venous Leg Ulcer Nursing Diagnoses: Actual venous Insuffiency (use after diagnosis is confirmed) Knowledge deficit related to disease process and management Potential for venous Insuffiency (use before diagnosis confirmed) Goals: Patient will maintain optimal edema control Date Initiated: 11/26/2021 Target Resolution Date: 11/26/2021 Goal Status: Active Interventions: Assess peripheral edema status every visit. Notes: Wound/Skin Impairment Nursing Diagnoses: Impaired tissue integrity Knowledge deficit related to smoking impact on wound healing Knowledge deficit related to ulceration/compromised skin integrity Goals: Patient/caregiver will verbalize understanding of skin care regimen Date Initiated: 06/03/2021 Target Resolution Date: 06/03/2021 Goal Status:  Active Interventions: Assess ulceration(s) every visit [name], [name] ([id number]) Provide education on ulcer and skin care Treatment Activities: Skin care regimen initiated : 06/03/2021 Topical wound management initiated : 06/03/2021 Notes: Electronic Signature(s) Signed: 11/26/2021 4:28:01 PM By: Alycia Rossetti Entered By: Alycia Rossetti on 11/26/2021 11:39:50 [name] ([id number]) -------------------------------------------------------------------------------- Pain Assessment Details Patient Name: [name]. Date of Service: 11/26/2021 10:30 AM Medical Record Number: [id number] Patient Account Number: [id number] Date of Birth/Sex: 1955/10/03 (66 y.o. F) Treating RN: Alycia Rossetti Primary Care Seneca Hoback: [name] Other Clinician: Referring Helane Briceno: [name] Treating Jaeli Grubb/Extender: Jeri Cos Weeks in Treatment: 25 Active Problems Location of Pain Severity and Description of Pain Patient Has Paino Yes Site Locations Pain Management and Medication Current Pain Management: Electronic Signature(s) Signed: 11/26/2021 4:28:01 PM By: Alycia Rossetti Entered By: Alycia Rossetti on 11/26/2021 10:55:18 [name] ([id number]) -------------------------------------------------------------------------------- Wound Assessment Details Patient Name: [name]. Date of Service: 11/26/2021 10:30 AM Medical Record Number: [id number] Patient Account Number: [id number] Date of Birth/Sex: 1955-07-17 (66 y.o. F) Treating RN: Alycia Rossetti Primary Care Yetta Marceaux: [name] Other Clinician: Referring Marsean Elkhatib: [name] Treating Leelynn Whetsel/Extender: Jeri Cos Weeks in Treatment: 25 Wound Status Wound Number: 60 Primary Etiology: Pressure Ulcer Wound Location: Right, Posterior Lower Leg Wound  Status: Open Wounding Event: Pressure Injury Date Acquired: 05/17/2021 Weeks Of Treatment: 25 Clustered Wound: No Wound Measurements Length: (cm) 13 Width: (cm)  12 Depth: (cm) 0.1 Area: (cm) 122.522 Volume: (cm) 12.252 % Reduction in Area: -21585.3% % Reduction in Volume: -21394.7% Wound Description Classification: Category/Stage III Exudate Amount: Large Exudate Type: Sanguinous Exudate Color: red Electronic Signature(s) Signed: 11/26/2021 4:28:01 PM By: Alycia Rossetti Entered By: Alycia Rossetti on 11/26/2021 11:05:11 [name] ([id number]) -------------------------------------------------------------------------------- Wound Assessment Details Patient Name: [name]. Date of Service: 11/26/2021 10:30 AM Medical Record Number: [id number] Patient Account Number: [id number] Date of Birth/Sex: 07/16/1955 (66 y.o. F) Treating RN: Alycia Rossetti Primary Care Ashlee Player: [name] Other Clinician: Referring Bailey Faiella: [name] Treating Rally Ouch/Extender: Jeri Cos Weeks in Treatment: 25 Wound Status Wound Number: 61 Primary Etiology: Lymphedema Wound Location: Left, Posterior Lower Leg Wound Status: Open Wounding Event: Gradually Appeared Date Acquired: 06/11/2021 Weeks Of Treatment: 22 Clustered Wound: No Wound Measurements Length: (cm) 13 Width: (cm) 6 Depth: (cm) 0.3 Area: (cm) 61.261 Volume: (cm) 18.378 % Reduction in Area: -92.6% % Reduction in Volume: -477.7% Wound Description Classification: Full Thickness With Exposed Support Structures Exudate Amount: Large Exudate Type: Serosanguineous Exudate Color: red, brown Electronic Signature(s) Signed: 11/26/2021 4:28:01 PM By: Alycia Rossetti Entered By: Alycia Rossetti on 11/26/2021 11:05:11 [name] ([id number]) -------------------------------------------------------------------------------- Wound Assessment Details Patient Name: [name]. Date of Service: 11/26/2021 10:30 AM Medical Record Number: [id number] Patient Account Number: [id number] Date of Birth/Sex: May 29, 1956 (66 y.o. F) Treating RN: Alycia Rossetti Primary Care Spenser Harren: [name] Other Clinician: Referring Carlester Kasparek: [name] Treating Donielle Kaigler/Extender: Jeri Cos Weeks in Treatment: 25 Wound Status Wound Number: 62 Primary Etiology: Lymphedema Wound Location: Left, Midline, Posterior Lower Leg Wound Status: Open Wounding Event: Gradually Appeared Date Acquired: 06/11/2021 Weeks Of Treatment: 22 Clustered Wound: No Wound Measurements Length: (cm) 7.5 Width: (cm) 3 Depth: (cm) 0.1 Area: (cm) 17.671 Volume: (cm) 1.767 % Reduction in Area: -1789.9% % Reduction in Volume: -1800% Wound Description Classification: Full Thickness Without Exposed Support Structu Exudate Amount: Large Exudate Type: Sanguinous Exudate Color: red res Electronic Signature(s) Signed: 11/26/2021 4:28:01 PM By: Alycia Rossetti Entered By: Alycia Rossetti on 11/26/2021 11:05:11 [name] ([id number]) -------------------------------------------------------------------------------- Wound Assessment Details Patient Name: [name]. Date of Service: 11/26/2021 10:30 AM Medical Record Number: [id number] Patient Account Number: [id number] Date of Birth/Sex: 10-Aug-1955 (66 y.o. F) Treating RN: Alycia Rossetti Primary Care Maze Corniel: [name] Other Clinician: Referring Tyne Banta: [name] Treating Luvern Mcisaac/Extender: Jeri Cos Weeks in Treatment: 25 Wound Status Wound Number: 63 Primary Etiology: Open Surgical Wound Wound Location: Left Calcaneus Secondary Etiology: Diabetic Wound/Ulcer of the Lower Extremity Wounding Event: Gradually Appeared Wound Status: Healed - Epithelialized Date Acquired: 04/27/2021 Weeks Of Treatment: 19 Clustered Wound: No Wound Measurements Length: (cm) 0 Width: (cm) 0 Depth: (cm) 0 Area: (cm) Volume: (cm) % Reduction in Area: 100% % Reduction in Volume: 100% 0 0 Wound Description Classification: Full Thickness Without Exposed Support Structu Exudate Amount: Medium Exudate Type: Serosanguineous Exudate Color: red,  brown res Electronic Signature(s) Signed: 11/26/2021 4:28:01 PM By: Alycia Rossetti Entered By: Alycia Rossetti on 11/26/2021 11:38:37 [name] ([id number]) -------------------------------------------------------------------------------- Wound Assessment Details Patient Name: [name]. Date of Service: 11/26/2021 10:30 AM Medical Record Number: [id number] Patient Account Number: [id number] Date of Birth/Sex: Apr 04, 1956 (66 y.o. F) Treating RN: Alycia Rossetti Primary Care Jadalee Westcott: [name] Other Clinician: Referring Danylle Ouk: [name] Treating Nikko Quast/Extender: Jeri Cos Weeks in Treatment: 25 Wound Status Wound Number: 36  Primary Etiology: Diabetic Wound/Ulcer of the Lower Extremity Wound Location: Right, Lateral Foot Wound Status: Healed - Epithelialized Wounding Event: Other Lesion Date Acquired: 10/04/2021 Weeks Of Treatment: 7 Clustered Wound: No Wound Measurements Length: (cm) 0 Width: (cm) 0 Depth: (cm) 0 Area: (cm) Volume: (cm) % Reduction in Area: 100% % Reduction in Volume: 100% 0 0 Wound Description Classification: Grade 0 Exudate Amount: None Present Electronic Signature(s) Signed: 11/26/2021 4:28:01 PM By: Alycia Rossetti Entered By: Alycia Rossetti on 11/26/2021 11:38:37 [name] ([id number]) -------------------------------------------------------------------------------- Wound Assessment Details Patient Name: [name]. Date of Service: 11/26/2021 10:30 AM Medical Record Number: [id number] Patient Account Number: [id number] Date of Birth/Sex: 03-11-1956 (66 y.o. F) Treating RN: Alycia Rossetti Primary Care Freeland Pracht: [name] Other Clinician: Referring Keishawn Darsey: [name] Treating Alexandrina Fiorini/Extender: Jeri Cos Weeks in Treatment: 25 Wound Status Wound Number: 66 Primary Pressure Ulcer Etiology: Wound Location: Midline Sacrum Wound Open Wounding Event: Pressure Injury Status: Date Acquired: 10/08/2021 Comorbid  Lymphedema, Hypertension, Peripheral Arterial Disease, Weeks Of Treatment: 4 History: Peripheral Venous Disease, Type II Diabetes, End Stage Clustered Wound: Yes Renal Disease, Osteoarthritis, Neuropathy Photos Wound Measurements Length: (cm) Width: (cm) Depth: (cm) Clustered Quantity: Area: (cm) Volume: (cm) 0 % Reduction in Area: 100% 0 % Reduction in Volume: 100% 0 Epithelialization: None 2 0 0 Wound Description Classification: Category/Stage III Exudate Amount: Medium Exudate Type: Serosanguineous Exudate Color: red, brown Foul Odor After Cleansing: No Slough/Fibrino Yes Wound Bed Granulation Amount: Small (1-33%) Exposed Structure Granulation Quality: Red Fat Layer (Subcutaneous Tissue) Exposed: Yes Necrotic Amount: Large (67-100%) Necrotic Quality: Adherent Slough Electronic Signature(s) Signed: 11/26/2021 4:28:01 PM By: Alycia Rossetti Entered By: Alycia Rossetti on 11/26/2021 11:19:17 [name] ([id number]) -------------------------------------------------------------------------------- Vitals Details Patient Name: [name]. Date of Service: 11/26/2021 10:30 AM Medical Record Number: [id number] Patient Account Number: [id number] Date of Birth/Sex: 02-May-1956 (66 y.o. F) Treating RN: Alycia Rossetti Primary Care Scotty Weigelt: [name] Other Clinician: Referring Crescent Gotham: [name] Treating Deronda Christian/Extender: Skipper Cliche in Treatment: 25 Vital Signs Time Taken: 10:54 Temperature (F): 98.2 Height (in): 65 Pulse (bpm): 78 Weight (lbs): 245.4 Respiratory Rate (breaths/min): 16 Body Mass Index (BMI): 40.8 Blood Pressure (mmHg): 122/63 Reference Range: 80 - 120 mg / dl Electronic Signature(s) Signed: 11/26/2021 4:28:01 PM By: Alycia Rossetti Entered By: Alycia Rossetti on 11/26/2021 10:54:54

## 2021-11-29 DIAGNOSIS — R1314 Dysphagia, pharyngoesophageal phase: Secondary | ICD-10-CM | POA: Diagnosis not present

## 2021-11-29 DIAGNOSIS — R1312 Dysphagia, oropharyngeal phase: Secondary | ICD-10-CM | POA: Diagnosis not present

## 2021-11-30 DIAGNOSIS — R1314 Dysphagia, pharyngoesophageal phase: Secondary | ICD-10-CM | POA: Diagnosis not present

## 2021-11-30 DIAGNOSIS — R1312 Dysphagia, oropharyngeal phase: Secondary | ICD-10-CM | POA: Diagnosis not present

## 2021-12-01 DIAGNOSIS — R1312 Dysphagia, oropharyngeal phase: Secondary | ICD-10-CM | POA: Diagnosis not present

## 2021-12-01 DIAGNOSIS — R1314 Dysphagia, pharyngoesophageal phase: Secondary | ICD-10-CM | POA: Diagnosis not present

## 2021-12-02 DIAGNOSIS — R1312 Dysphagia, oropharyngeal phase: Secondary | ICD-10-CM | POA: Diagnosis not present

## 2021-12-02 DIAGNOSIS — R1314 Dysphagia, pharyngoesophageal phase: Secondary | ICD-10-CM | POA: Diagnosis not present

## 2021-12-02 DIAGNOSIS — E039 Hypothyroidism, unspecified: Secondary | ICD-10-CM | POA: Diagnosis not present

## 2021-12-05 DIAGNOSIS — R1312 Dysphagia, oropharyngeal phase: Secondary | ICD-10-CM | POA: Diagnosis not present

## 2021-12-05 DIAGNOSIS — R1314 Dysphagia, pharyngoesophageal phase: Secondary | ICD-10-CM | POA: Diagnosis not present

## 2021-12-06 DIAGNOSIS — R1314 Dysphagia, pharyngoesophageal phase: Secondary | ICD-10-CM | POA: Diagnosis not present

## 2021-12-06 DIAGNOSIS — R1312 Dysphagia, oropharyngeal phase: Secondary | ICD-10-CM | POA: Diagnosis not present

## 2021-12-07 DIAGNOSIS — I10 Essential (primary) hypertension: Secondary | ICD-10-CM | POA: Diagnosis not present

## 2021-12-07 DIAGNOSIS — I509 Heart failure, unspecified: Secondary | ICD-10-CM | POA: Diagnosis not present

## 2021-12-07 DIAGNOSIS — G8929 Other chronic pain: Secondary | ICD-10-CM | POA: Diagnosis not present

## 2021-12-07 DIAGNOSIS — R221 Localized swelling, mass and lump, neck: Secondary | ICD-10-CM | POA: Diagnosis not present

## 2021-12-07 DIAGNOSIS — R1314 Dysphagia, pharyngoesophageal phase: Secondary | ICD-10-CM | POA: Diagnosis not present

## 2021-12-07 DIAGNOSIS — E119 Type 2 diabetes mellitus without complications: Secondary | ICD-10-CM | POA: Diagnosis not present

## 2021-12-07 DIAGNOSIS — K5900 Constipation, unspecified: Secondary | ICD-10-CM | POA: Diagnosis not present

## 2021-12-07 DIAGNOSIS — E039 Hypothyroidism, unspecified: Secondary | ICD-10-CM | POA: Diagnosis not present

## 2021-12-07 DIAGNOSIS — R1312 Dysphagia, oropharyngeal phase: Secondary | ICD-10-CM | POA: Diagnosis not present

## 2021-12-08 DIAGNOSIS — R1312 Dysphagia, oropharyngeal phase: Secondary | ICD-10-CM | POA: Diagnosis not present

## 2021-12-08 DIAGNOSIS — F4321 Adjustment disorder with depressed mood: Secondary | ICD-10-CM | POA: Diagnosis not present

## 2021-12-08 DIAGNOSIS — R69 Illness, unspecified: Secondary | ICD-10-CM | POA: Diagnosis not present

## 2021-12-08 DIAGNOSIS — F411 Generalized anxiety disorder: Secondary | ICD-10-CM | POA: Diagnosis not present

## 2021-12-08 DIAGNOSIS — R1314 Dysphagia, pharyngoesophageal phase: Secondary | ICD-10-CM | POA: Diagnosis not present

## 2021-12-14 DIAGNOSIS — E119 Type 2 diabetes mellitus without complications: Secondary | ICD-10-CM | POA: Diagnosis not present

## 2021-12-14 DIAGNOSIS — K5900 Constipation, unspecified: Secondary | ICD-10-CM | POA: Diagnosis not present

## 2021-12-14 DIAGNOSIS — I509 Heart failure, unspecified: Secondary | ICD-10-CM | POA: Diagnosis not present

## 2021-12-14 DIAGNOSIS — I10 Essential (primary) hypertension: Secondary | ICD-10-CM | POA: Diagnosis not present

## 2021-12-14 DIAGNOSIS — I890 Lymphedema, not elsewhere classified: Secondary | ICD-10-CM | POA: Diagnosis not present

## 2021-12-14 DIAGNOSIS — E039 Hypothyroidism, unspecified: Secondary | ICD-10-CM | POA: Diagnosis not present

## 2021-12-22 DIAGNOSIS — F411 Generalized anxiety disorder: Secondary | ICD-10-CM | POA: Diagnosis not present

## 2021-12-22 DIAGNOSIS — R69 Illness, unspecified: Secondary | ICD-10-CM | POA: Diagnosis not present

## 2021-12-22 DIAGNOSIS — F330 Major depressive disorder, recurrent, mild: Secondary | ICD-10-CM | POA: Diagnosis not present

## 2021-12-24 ENCOUNTER — Encounter: Payer: Medicare HMO | Admitting: Physician Assistant

## 2021-12-24 DIAGNOSIS — R29898 Other symptoms and signs involving the musculoskeletal system: Secondary | ICD-10-CM | POA: Diagnosis not present

## 2021-12-24 DIAGNOSIS — E11622 Type 2 diabetes mellitus with other skin ulcer: Secondary | ICD-10-CM | POA: Diagnosis not present

## 2021-12-24 DIAGNOSIS — E668 Other obesity: Secondary | ICD-10-CM | POA: Diagnosis not present

## 2021-12-24 DIAGNOSIS — R531 Weakness: Secondary | ICD-10-CM | POA: Diagnosis not present

## 2021-12-24 DIAGNOSIS — R52 Pain, unspecified: Secondary | ICD-10-CM | POA: Diagnosis not present

## 2021-12-24 DIAGNOSIS — L89893 Pressure ulcer of other site, stage 3: Secondary | ICD-10-CM | POA: Diagnosis not present

## 2021-12-24 DIAGNOSIS — L97222 Non-pressure chronic ulcer of left calf with fat layer exposed: Secondary | ICD-10-CM | POA: Diagnosis not present

## 2021-12-24 DIAGNOSIS — Z6841 Body Mass Index (BMI) 40.0 and over, adult: Secondary | ICD-10-CM | POA: Diagnosis not present

## 2021-12-24 DIAGNOSIS — Z7401 Bed confinement status: Secondary | ICD-10-CM | POA: Diagnosis not present

## 2021-12-24 DIAGNOSIS — R5381 Other malaise: Secondary | ICD-10-CM | POA: Diagnosis not present

## 2021-12-24 DIAGNOSIS — I890 Lymphedema, not elsewhere classified: Secondary | ICD-10-CM | POA: Diagnosis not present

## 2021-12-24 DIAGNOSIS — E1140 Type 2 diabetes mellitus with diabetic neuropathy, unspecified: Secondary | ICD-10-CM | POA: Diagnosis not present

## 2021-12-24 DIAGNOSIS — E038 Other specified hypothyroidism: Secondary | ICD-10-CM | POA: Diagnosis not present

## 2021-12-24 DIAGNOSIS — Z743 Need for continuous supervision: Secondary | ICD-10-CM | POA: Diagnosis not present

## 2021-12-24 DIAGNOSIS — L89896 Pressure-induced deep tissue damage of other site: Secondary | ICD-10-CM | POA: Diagnosis not present

## 2021-12-24 DIAGNOSIS — I10 Essential (primary) hypertension: Secondary | ICD-10-CM | POA: Diagnosis not present

## 2021-12-24 DIAGNOSIS — L89153 Pressure ulcer of sacral region, stage 3: Secondary | ICD-10-CM | POA: Diagnosis not present

## 2021-12-24 DIAGNOSIS — W19XXXA Unspecified fall, initial encounter: Secondary | ICD-10-CM | POA: Diagnosis not present

## 2021-12-24 NOTE — Progress Notes (Addendum)
LASHE, OLIVEIRA ([id number]) Visit Report for 12/24/2021 Chief Complaint Document Details Patient Name: [name], [name]. Date of Service: 12/24/2021 10:30 AM Medical Record Number: [id number] Patient Account Number: [id number] Date of Birth/Sex: February 21, 1956 (66 y.o. F) Treating RN: Carlene Coria Primary Care Provider: [name] Other Clinician: Massie Kluver Referring Provider: [name] Treating Provider/Extender: Skipper Cliche in Treatment: 29 Information Obtained from: Patient Chief Complaint Bilateral lower extremity phlebolymphedema and recurrent calf ulcerations. New sacral wound as of 10/29/21 Electronic Signature(s) Signed: 12/24/2021 10:45:28 AM By: Worthy Keeler PA-C Entered By: Worthy Keeler on 12/24/2021 10:45:27 [name] ([id number]) -------------------------------------------------------------------------------- HPI Details Patient Name: [name] Date of Service: 12/24/2021 10:30 AM Medical Record Number: [id number] Patient Account Number: [id number] Date of Birth/Sex: Feb 21, 1956 (66 y.o. F) Treating RN: Carlene Coria Primary Care Provider: [name] Other Clinician: Massie Kluver Referring Provider: [name] Treating Provider/Extender: Skipper Cliche in Treatment: 29 History of Present Illness HPI Description: 66yo w/ h/o BLE phlebolymphedema, type 2 DM (unknown hemoglobin A1c), and obesity. No PVD. h/o chronic, recurrent BLE calf ulcers. Treated with 4 layer compression. Infrequently uses lymphedema pump. Bilateral lower extremity ulcerations healed as of June 2016. Fitted for custom compression stockings but did not receive them. Patient could not travel for lymphedema consult. She developed recurrent bilateral lower extremity ulcerations in August 2016. She is able to heal these quite quickly with 4 layer elastic compression bandages. However, she is not very compliant using her juxtalite compression garments and tends  to develop recurrent ulcerations while using juxtalite. Unna boots were applied this past week. She tolerated this well but performs minimal ambulation. She is scheduled to undergo total hip replacement tomorrow with Dr. Rudene Christians. She is unsure if he is aware of her ulcerations. She is otherwise without complaints. No significant pain. No fever or chills. Moderate clear drainage. Not on any antibiotics. 06/04/2015 -- she did have her hip surgery and has now been in rehabilitation and they're controlling her fluid intake and diuretics to the extent that she has lost 13 pounds of weight and her lower extremity circumference is decreased by 7 cm. 06/18/2015 -- she is still in the rehabilitation facility and continues to be looked after well and has lost a total of 15 pounds and her diabetes is under much better control. Readmission: 06/03/2021 upon evaluation today patient actually appears to be doing somewhat poorly in regard to a wound on the right posterior lower leg. Patient does have a history of previously having been seen here in the clinic but this predates my time here back in 2017. Subsequently unfortunately right now she is having a significant issue with bleeding in the posterior of her right calf this appears to be a deep tissue injury as developed into an ulceration. She does have a history significant for lymphedema, diabetes mellitus type 2, diabetic neuropathy, hypertension, hypothyroidism, and obesity. She is currently in a skilled nursing facility. The dressing that she had in place during evaluation today was Xeroform followed by significant amount of gauze and this was severely stuck. Upon removal the patient was having significant bleeding she also had significant work done in the hospital in regard to her blood flow and Dr. Lucky Cowboy does look like he has her on 2 blood thinners Plavix and Lovenox. This is probably part of the reason why she is bleeding so significantly. With that being  said I do believe that based on what we are seeing currently this is probably can to be  something that were getting have to try to get stopped with more than just pressure to the region currently. I think surgery foam is probably can to be necessary. If we can get the stop of the Surgifoam she is probably can have to go to the hospital to try to get this bleeding stopped. 12/30; this is a patient who is currently in a skilled facility. She has wounds on her bilateral calfs likely secondary to severe chronic venous insufficiency and lymphedema the care of these wounds is complicated by the fact that she is on Lovenox and Plavix and bleeds very frequently with even minimal trauma to these wounds. It would appear that she had a Vaseline gauze on the surface of these in the process of undressing them she bled freely from especially the right but also the left. I had to go in and use silver nitrate. The skin on her legs is significant for chronic venous stasis and lymphedema. Very dry flaking skin 07/16/2021; I actually was a patent person who saw this woman 3 weeks ago. She is a nursing facility has chronic venous wounds and bilateral lower extremity wounds. We use Xeroform under compression. The time she was on Lovenox and Plavix I thought I had an excellent reason for these wounds to bleed so freely. Since then the Lovenox has been stopped and should be well out of her system but she is still on Plavix. She still has very friable bleeding wounds on the left lateral left Posterior and right Posterior lower extremity.These believe very freely when we take off the dressing Also she has a left heel wound which when we saw her last time was a surgical wound however I gather we have been asked to follow this as well. I am not exactly sure at this time what her surgery was but she comes in with a wound VAC on this and they are apparently changing it at the nursing home. 08/27/2021 upon evaluation patient appears  to be doing better in regard to her wound on the legs as well as the heel. Fortunately at this point I do not see any need for a wound VAC which is good news overall I think that the Xeroform is probably a good option that is what is being utilized here as well. Fortunately I do not see any signs of active infection locally nor systemically which is great news and overall I think that the patient is making excellent progress. The area on her right gluteal area actually appears to be doing quite well and I am pleased with that. I do believe the silver alginate dressing here could be of benefit for her. 09/17/2021 upon evaluation patient appears to be doing actually better in regard to her wounds on the legs. Fortunately she does not seem to be showing any signs of worsening which is great news and overall very pleased with where we stand. I do believe that she is tolerating the dressing changes without complication which is great news. 10-08-2021 upon evaluation today patient unfortunately continues to have significant issues with her legs. Unfortunately the biggest issue that we see Currently is that she is still having a lot of bleeding from her legs that she is also not able to move well due to her knee pain her left knee is quite significant as far as the discomfort is concerned and the right knee is getting equally as bad. Fortunately I do not see any evidence of active infection locally or systemically which is  great news. Nonetheless I do believe that her heels are looking much better there is a tiny area open on Batie, Bibi K. ([id number]) the left heel the right is doing quite well. In regard to the gluteal/sacral region this appears to be healed. 10-29-2021 upon evaluation today patient unfortunately is appearing to do much worse compared to normal. She has more breakdown in regard to the left lateral leg and again I think this is pressure related. She also has more breakdown in regard to the  gluteal area and she is very concerned about this in particular which is completely understandable. With that being said I do believe that she would benefit from more aggressive and appropriate offloading I discussed with her that she needs to allow the facility to change her position every 2 hours and she is can have to find different positions other than just lying on her back. Also discussed with her that obviously the facility needs to make sure to help her with repositioning every 2 hours I think both aspects of this are important. Otherwise her wound on the sacral area is going to continue to get worse and the leg will continue to get worse. 11-26-2021 upon evaluation today patient appears to be doing well with regard to her wounds all things considered. The gluteal areas are all healed which is great news. With that being said she is still having some issues here with her lower extremities. I do think that this is still something that we need to be very mindful of. Unfortunately the facility is still not washing her legs she has a lot of caked on dry skin with Xeroform we will get a try to get some of that off today. With that being said he does have reiterated in the past the patient needs to have her legs washed regularly. 12-24-2021 upon evaluation patient's wounds are showing signs of improvement albeit slowly. Fortunately I do not see any signs of infection locally or systemically which is great news. Nonetheless I do believe that the patient is doing quite well from the standpoint of the overall appearance of her wounds which are better than where they have been even if not where we would like them to be. Electronic Signature(s) Signed: 12/24/2021 4:49:12 PM By: Worthy Keeler PA-C Entered By: Worthy Keeler on 12/24/2021 16:49:12 [name] ([id number]) -------------------------------------------------------------------------------- Physical Exam Details Patient Name: [name] Date of Service: 12/24/2021 10:30 AM Medical Record Number: [id number] Patient Account Number: [id number] Date of Birth/Sex: 1955/09/04 (66 y.o. F) Treating RN: Carlene Coria Primary Care Provider: [name] Other Clinician: Massie Kluver Referring Provider: [name] Treating Provider/Extender: Skipper Cliche in Treatment: 6 Constitutional Well-nourished and well-hydrated in no acute distress. Respiratory normal breathing without difficulty. Psychiatric this patient is able to make decisions and demonstrates good insight into disease process. Alert and Oriented x 3. pleasant and cooperative. Notes Upon inspection patient's wound bed actually showed signs of good granulation and epithelization at this point. Fortunately I do not see any evidence of infection locally or systemically which is great news and overall I am extremely pleased with where we stand currently. Electronic Signature(s) Signed: 12/24/2021 4:49:35 PM By: Worthy Keeler PA-C Entered By: Worthy Keeler on 12/24/2021 16:49:35 [name] ([id number]) -------------------------------------------------------------------------------- Physician Orders Details Patient Name: [name] Date of Service: 12/24/2021 10:30 AM Medical Record Number: [id number] Patient Account Number: [id number] Date of Birth/Sex: [birth year] (66 y.o. F) Treating RN: Carlene Coria Primary Care  Provider: [name] Other Clinician: Massie Kluver Referring Provider: [name] Treating Provider/Extender: Skipper Cliche in Treatment: 29 Verbal / Phone Orders: No Diagnosis Coding ICD-10 Coding Code Description I89.0 Lymphedema, not elsewhere classified E11.622 Type 2 diabetes mellitus with other skin ulcer E11.40 Type 2 diabetes mellitus with diabetic neuropathy, unspecified L89.893 Pressure ulcer of other site, stage 3 L89.896 Pressure-induced deep tissue damage of other site L89.153 Pressure ulcer  of sacral region, stage 3 I10 Essential (primary) hypertension E03.8 Other specified hypothyroidism E66.09 Other obesity due to excess calories Follow-up Appointments Wound #60 Right,Posterior Lower Leg o Return Appointment in 1 month Bathing/ Shower/ Hygiene o Wash wounds with antibacterial soap and water. - WASH LEGS WITH SOAP and WATER before applying dressings. o No tub bath. Edema Control - Lymphedema / Segmental Compressive Device / Other o Other: - Lymphedema-use ace wraps to help control swelling. Off-Loading o Turn and reposition every 2 hours - PLEASE MAKE SURE THIS IS COMPLETED EVERY TWO HOURS THANK YOU o Other: - Put pillows under knees to keep pressure off of calves. Deep tissue injury on posterior calves. Turn on side every 1 hous to keep pressure off calves wear offloading boots Additional Orders / Instructions o Follow Nutritious Diet and Increase Protein Intake Wound Treatment Wound #60 - Lower Leg Wound Laterality: Right, Posterior Cleanser: Normal Saline 3 x Per Week/30 Days Discharge Instructions: Wash your hands with soap and water. Remove old dressing, discard into plastic bag and place into trash. Cleanse the wound with Normal Saline prior to applying a clean dressing using gauze sponges, not tissues or cotton balls. Do not scrub or use excessive force. Pat dry using gauze sponges, not tissue or cotton balls. Primary Dressing: Xeroform 5x9-HBD (in/in) 3 x Per Week/30 Days Discharge Instructions: 1. Apply Xeroform 5x9-HBD (in/in) as directed Secondary Dressing: ABD Pad 5x9 (in/in) 3 x Per Week/30 Days Discharge Instructions: 2. Cover with ABD pad Secured With: ACE WRAP - 74M ACE Elastic Bandage With VELCRO Brand Closure, 4 (in) 3 x Per Week/30 Days Discharge Instructions: 4. Ace wrap to secure dressing in place and control swelling. Secured With: [employer] or Non-Sterile 6-ply 4.5x4 (yd/yd) 3 x Per Week/30 Days Discharge Instructions: 3.  Apply Kerlix as directed [name], [name] ([id number]) Wound #61 - Lower Leg Wound Laterality: Left, Posterior Cleanser: Normal Saline 3 x Per Week/30 Days Discharge Instructions: Wash your hands with soap and water. Remove old dressing, discard into plastic bag and place into trash. Cleanse the wound with Normal Saline prior to applying a clean dressing using gauze sponges, not tissues or cotton balls. Do not scrub or use excessive force. Pat dry using gauze sponges, not tissue or cotton balls. Primary Dressing: Xeroform 5x9-HBD (in/in) 3 x Per Week/30 Days Discharge Instructions: 1. Apply Xeroform 5x9-HBD (in/in) as directed Secondary Dressing: ABD Pad 5x9 (in/in) 3 x Per Week/30 Days Discharge Instructions: 2. Cover with ABD pad Secured With: ACE WRAP - 74M ACE Elastic Bandage With VELCRO Brand Closure, 4 (in) 3 x Per Week/30 Days Discharge Instructions: 4. Ace wrap to secure dressing in place and control swelling. Secured With: [employer] or Non-Sterile 6-ply 4.5x4 (yd/yd) 3 x Per Week/30 Days Discharge Instructions: 3. Apply Kerlix as directed Wound #62 - Lower Leg Wound Laterality: Left, Midline, Posterior Cleanser: Normal Saline 3 x Per Week/30 Days Discharge Instructions: Wash your hands with soap and water. Remove old dressing, discard into plastic bag and place into trash. Cleanse the wound with Normal Saline prior  to applying a clean dressing using gauze sponges, not tissues or cotton balls. Do not scrub or use excessive force. Pat dry using gauze sponges, not tissue or cotton balls. Primary Dressing: Xeroform 5x9-HBD (in/in) 3 x Per Week/30 Days Discharge Instructions: 1. Apply Xeroform 5x9-HBD (in/in) as directed Secondary Dressing: ABD Pad 5x9 (in/in) 3 x Per Week/30 Days Discharge Instructions: 2. Cover with ABD pad Secured With: ACE WRAP - 57M ACE Elastic Bandage With VELCRO Brand Closure, 4 (in) 3 x Per Week/30 Days Discharge Instructions: 4. Ace wrap to secure  dressing in place and control swelling. Secured With: [employer] or Non-Sterile 6-ply 4.5x4 (yd/yd) 3 x Per Week/30 Days Discharge Instructions: 3. Apply Kerlix as directed Electronic Signature(s) Signed: 12/24/2021 4:25:13 PM By: Massie Kluver Signed: 12/24/2021 4:59:17 PM By: Worthy Keeler PA-C Entered By: Massie Kluver on 12/24/2021 11:29:07 [name] ([id number]) -------------------------------------------------------------------------------- Problem List Details Patient Name: [name] Date of Service: 12/24/2021 10:30 AM Medical Record Number: [id number] Patient Account Number: [id number] Date of Birth/Sex: [birth year] (66 y.o. F) Treating RN: Carlene Coria Primary Care Provider: [name] Other Clinician: Massie Kluver Referring Provider: [name] Treating Provider/Extender: Skipper Cliche in Treatment: 29 Active Problems ICD-10 Encounter Code Description Active Date MDM Diagnosis I89.0 Lymphedema, not elsewhere classified 06/03/2021 No Yes E11.622 Type 2 diabetes mellitus with other skin ulcer 06/03/2021 No Yes E11.40 Type 2 diabetes mellitus with diabetic neuropathy, unspecified 06/03/2021 No Yes L89.893 Pressure ulcer of other site, stage 3 06/03/2021 No Yes L89.896 Pressure-induced deep tissue damage of other site 06/03/2021 No Yes L89.153 Pressure ulcer of sacral region, stage 3 08/27/2021 No Yes I10 Essential (primary) hypertension 06/03/2021 No Yes E03.8 Other specified hypothyroidism 06/03/2021 No Yes E66.09 Other obesity due to excess calories 06/03/2021 No Yes Inactive Problems Resolved Problems Electronic Signature(s) Signed: 12/24/2021 10:45:23 AM By: Worthy Keeler PA-C Entered By: Worthy Keeler on 12/24/2021 10:45:22 [name] ([id number]) -------------------------------------------------------------------------------- Progress Note Details Patient Name: [name]. Date of Service: 12/24/2021 10:30  AM Medical Record Number: [id number] Patient Account Number: [id number] Date of Birth/Sex: [birth year] (66 y.o. F) Treating RN: Carlene Coria Primary Care Provider: [name] Other Clinician: Massie Kluver Referring Provider: [name] Treating Provider/Extender: Skipper Cliche in Treatment: 29 Subjective Chief Complaint Information obtained from Patient Bilateral lower extremity phlebolymphedema and recurrent calf ulcerations. New sacral wound as of 10/29/21 History of Present Illness (HPI) 66yo w/ h/o BLE phlebolymphedema, type 2 DM (unknown hemoglobin A1c), and obesity. No PVD. h/o chronic, recurrent BLE calf ulcers. Treated with 4 layer compression. Infrequently uses lymphedema pump. Bilateral lower extremity ulcerations healed as of June 2016. Fitted for custom compression stockings but did not receive them. Patient could not travel for lymphedema consult. She developed recurrent bilateral lower extremity ulcerations in August 2016. She is able to heal these quite quickly with 4 layer elastic compression bandages. However, she is not very compliant using her juxtalite compression garments and tends to develop recurrent ulcerations while using juxtalite. Unna boots were applied this past week. She tolerated this well but performs minimal ambulation. She is scheduled to undergo total hip replacement tomorrow with Dr. Rudene Christians. She is unsure if he is aware of her ulcerations. She is otherwise without complaints. No significant pain. No fever or chills. Moderate clear drainage. Not on any antibiotics. 06/04/2015 -- she did have her hip surgery and has now been in rehabilitation and they're controlling her fluid intake and diuretics to the extent that she has lost  13 pounds of weight and her lower extremity circumference is decreased by 7 cm. 06/18/2015 -- she is still in the rehabilitation facility and continues to be looked after well and has lost a total of 15 pounds and her diabetes  is under much better control. Readmission: 06/03/2021 upon evaluation today patient actually appears to be doing somewhat poorly in regard to a wound on the right posterior lower leg. Patient does have a history of previously having been seen here in the clinic but this predates my time here back in 2017. Subsequently unfortunately right now she is having a significant issue with bleeding in the posterior of her right calf this appears to be a deep tissue injury as developed into an ulceration. She does have a history significant for lymphedema, diabetes mellitus type 2, diabetic neuropathy, hypertension, hypothyroidism, and obesity. She is currently in a skilled nursing facility. The dressing that she had in place during evaluation today was Xeroform followed by significant amount of gauze and this was severely stuck. Upon removal the patient was having significant bleeding she also had significant work done in the hospital in regard to her blood flow and Dr. Lucky Cowboy does look like he has her on 2 blood thinners Plavix and Lovenox. This is probably part of the reason why she is bleeding so significantly. With that being said I do believe that based on what we are seeing currently this is probably can to be something that were getting have to try to get stopped with more than just pressure to the region currently. I think surgery foam is probably can to be necessary. If we can get the stop of the Surgifoam she is probably can have to go to the hospital to try to get this bleeding stopped. 12/30; this is a patient who is currently in a skilled facility. She has wounds on her bilateral calfs likely secondary to severe chronic venous insufficiency and lymphedema the care of these wounds is complicated by the fact that she is on Lovenox and Plavix and bleeds very frequently with even minimal trauma to these wounds. It would appear that she had a Vaseline gauze on the surface of these in the process of  undressing them she bled freely from especially the right but also the left. I had to go in and use silver nitrate. The skin on her legs is significant for chronic venous stasis and lymphedema. Very dry flaking skin 07/16/2021; I actually was a patent person who saw this woman 3 weeks ago. She is a nursing facility has chronic venous wounds and bilateral lower extremity wounds. We use Xeroform under compression. The time she was on Lovenox and Plavix I thought I had an excellent reason for these wounds to bleed so freely. Since then the Lovenox has been stopped and should be well out of her system but she is still on Plavix. She still has very friable bleeding wounds on the left lateral left Posterior and right Posterior lower extremity.These believe very freely when we take off the dressing Also she has a left heel wound which when we saw her last time was a surgical wound however I gather we have been asked to follow this as well. I am not exactly sure at this time what her surgery was but she comes in with a wound VAC on this and they are apparently changing it at the nursing home. 08/27/2021 upon evaluation patient appears to be doing better in regard to her wound on the legs as  well as the heel. Fortunately at this point I do not see any need for a wound VAC which is good news overall I think that the Xeroform is probably a good option that is what is being utilized here as well. Fortunately I do not see any signs of active infection locally nor systemically which is great news and overall I think that the patient is making excellent progress. The area on her right gluteal area actually appears to be doing quite well and I am pleased with that. I do believe the silver alginate dressing here could be of benefit for her. 09/17/2021 upon evaluation patient appears to be doing actually better in regard to her wounds on the legs. Fortunately she does not seem to be showing any signs of worsening which  is great news and overall very pleased with where we stand. I do believe that she is tolerating the dressing changes without complication which is great news. [name], [name] ([id number]) 10-08-2021 upon evaluation today patient unfortunately continues to have significant issues with her legs. Unfortunately the biggest issue that we see Currently is that she is still having a lot of bleeding from her legs that she is also not able to move well due to her knee pain her left knee is quite significant as far as the discomfort is concerned and the right knee is getting equally as bad. Fortunately I do not see any evidence of active infection locally or systemically which is great news. Nonetheless I do believe that her heels are looking much better there is a tiny area open on the left heel the right is doing quite well. In regard to the gluteal/sacral region this appears to be healed. 10-29-2021 upon evaluation today patient unfortunately is appearing to do much worse compared to normal. She has more breakdown in regard to the left lateral leg and again I think this is pressure related. She also has more breakdown in regard to the gluteal area and she is very concerned about this in particular which is completely understandable. With that being said I do believe that she would benefit from more aggressive and appropriate offloading I discussed with her that she needs to allow the facility to change her position every 2 hours and she is can have to find different positions other than just lying on her back. Also discussed with her that obviously the facility needs to make sure to help her with repositioning every 2 hours I think both aspects of this are important. Otherwise her wound on the sacral area is going to continue to get worse and the leg will continue to get worse. 11-26-2021 upon evaluation today patient appears to be doing well with regard to her wounds all things considered. The gluteal areas  are all healed which is great news. With that being said she is still having some issues here with her lower extremities. I do think that this is still something that we need to be very mindful of. Unfortunately the facility is still not washing her legs she has a lot of caked on dry skin with Xeroform we will get a try to get some of that off today. With that being said he does have reiterated in the past the patient needs to have her legs washed regularly. 12-24-2021 upon evaluation patient's wounds are showing signs of improvement albeit slowly. Fortunately I do not see any signs of infection locally or systemically which is great news. Nonetheless I do believe that the patient is  doing quite well from the standpoint of the overall appearance of her wounds which are better than where they have been even if not where we would like them to be. Objective Constitutional Well-nourished and well-hydrated in no acute distress. Vitals Time Taken: 10:30 AM, Height: 65 in, Weight: 245.4 lbs, BMI: 40.8, Temperature: 98.0 F, Pulse: 105 bpm, Respiratory Rate: 16 breaths/min, Blood Pressure: 132/79 mmHg. Respiratory normal breathing without difficulty. Psychiatric this patient is able to make decisions and demonstrates good insight into disease process. Alert and Oriented x 3. pleasant and cooperative. General Notes: Upon inspection patient's wound bed actually showed signs of good granulation and epithelization at this point. Fortunately I do not see any evidence of infection locally or systemically which is great news and overall I am extremely pleased with where we stand currently. Integumentary (Hair, Skin) Wound #60 status is Open. Original cause of wound was Pressure Injury. The date acquired was: 05/17/2021. The wound has been in treatment 29 weeks. The wound is located on the Right,Posterior Lower Leg. The wound measures 6.4cm length x 8.5cm width x 0.1cm depth; 42.726cm^2 area and 4.273cm^3  volume. There is a large amount of sanguinous drainage noted. There is small (1-33%) red granulation within the wound bed. There is a small (1-33%) amount of necrotic tissue within the wound bed including Adherent Slough. Wound #61 status is Open. Original cause of wound was Gradually Appeared. The date acquired was: 06/11/2021. The wound has been in treatment 26 weeks. The wound is located on the Left,Posterior Lower Leg. The wound measures 5.9cm length x 1.6cm width x 0.1cm depth; 7.414cm^2 area and 0.741cm^3 volume. There is Fat Layer (Subcutaneous Tissue) exposed. There is no tunneling or undermining noted. There is a large amount of serosanguineous drainage noted. There is medium (34-66%) red granulation within the wound bed. There is a small (1-33%) amount of necrotic tissue within the wound bed including Adherent Slough. Wound #62 status is Open. Original cause of wound was Gradually Appeared. The date acquired was: 06/11/2021. The wound has been in treatment 26 weeks. The wound is located on the Left,Midline,Posterior Lower Leg. The wound measures 15.4cm length x 3.4cm width x 0.6cm depth; 41.123cm^2 area and 24.674cm^3 volume. There is Fat Layer (Subcutaneous Tissue) exposed. There is no tunneling or undermining noted. There is a large amount of sanguinous drainage noted. There is small (1-33%) red, pale granulation within the wound bed. There is a small (1-33%) amount of necrotic tissue within the wound bed including Adherent Slough. Assessment Active Problems ICD-10 [name], [name] ([id number]) Lymphedema, not elsewhere classified Type 2 diabetes mellitus with other skin ulcer Type 2 diabetes mellitus with diabetic neuropathy, unspecified Pressure ulcer of other site, stage 3 Pressure-induced deep tissue damage of other site Pressure ulcer of sacral region, stage 3 Essential (primary) hypertension Other specified hypothyroidism Other obesity due to excess  calories Plan Follow-up Appointments: Wound #60 Right,Posterior Lower Leg: Return Appointment in 1 month Bathing/ Shower/ Hygiene: Wash wounds with antibacterial soap and water. - WASH LEGS WITH SOAP and WATER before applying dressings. No tub bath. Edema Control - Lymphedema / Segmental Compressive Device / Other: Other: - Lymphedema-use ace wraps to help control swelling. Off-Loading: Turn and reposition every 2 hours - PLEASE MAKE SURE THIS IS COMPLETED EVERY TWO HOURS THANK YOU Other: - Put pillows under knees to keep pressure off of calves. Deep tissue injury on posterior calves. Turn on side every 1 hous to keep pressure off calves wear offloading boots Additional Orders / Instructions:  Follow Nutritious Diet and Increase Protein Intake WOUND #60: - Lower Leg Wound Laterality: Right, Posterior Cleanser: Normal Saline 3 x Per Week/30 Days Discharge Instructions: Wash your hands with soap and water. Remove old dressing, discard into plastic bag and place into trash. Cleanse the wound with Normal Saline prior to applying a clean dressing using gauze sponges, not tissues or cotton balls. Do not scrub or use excessive force. Pat dry using gauze sponges, not tissue or cotton balls. Primary Dressing: Xeroform 5x9-HBD (in/in) 3 x Per Week/30 Days Discharge Instructions: 1. Apply Xeroform 5x9-HBD (in/in) as directed Secondary Dressing: ABD Pad 5x9 (in/in) 3 x Per Week/30 Days Discharge Instructions: 2. Cover with ABD pad Secured With: ACE WRAP - 569M ACE Elastic Bandage With VELCRO Brand Closure, 4 (in) 3 x Per Week/30 Days Discharge Instructions: 4. Ace wrap to secure dressing in place and control swelling. Secured With: [employer] or Non-Sterile 6-ply 4.5x4 (yd/yd) 3 x Per Week/30 Days Discharge Instructions: 3. Apply Kerlix as directed WOUND #61: - Lower Leg Wound Laterality: Left, Posterior Cleanser: Normal Saline 3 x Per Week/30 Days Discharge Instructions: Wash your hands  with soap and water. Remove old dressing, discard into plastic bag and place into trash. Cleanse the wound with Normal Saline prior to applying a clean dressing using gauze sponges, not tissues or cotton balls. Do not scrub or use excessive force. Pat dry using gauze sponges, not tissue or cotton balls. Primary Dressing: Xeroform 5x9-HBD (in/in) 3 x Per Week/30 Days Discharge Instructions: 1. Apply Xeroform 5x9-HBD (in/in) as directed Secondary Dressing: ABD Pad 5x9 (in/in) 3 x Per Week/30 Days Discharge Instructions: 2. Cover with ABD pad Secured With: ACE WRAP - 569M ACE Elastic Bandage With VELCRO Brand Closure, 4 (in) 3 x Per Week/30 Days Discharge Instructions: 4. Ace wrap to secure dressing in place and control swelling. Secured With: [employer] or Non-Sterile 6-ply 4.5x4 (yd/yd) 3 x Per Week/30 Days Discharge Instructions: 3. Apply Kerlix as directed WOUND #62: - Lower Leg Wound Laterality: Left, Midline, Posterior Cleanser: Normal Saline 3 x Per Week/30 Days Discharge Instructions: Wash your hands with soap and water. Remove old dressing, discard into plastic bag and place into trash. Cleanse the wound with Normal Saline prior to applying a clean dressing using gauze sponges, not tissues or cotton balls. Do not scrub or use excessive force. Pat dry using gauze sponges, not tissue or cotton balls. Primary Dressing: Xeroform 5x9-HBD (in/in) 3 x Per Week/30 Days Discharge Instructions: 1. Apply Xeroform 5x9-HBD (in/in) as directed Secondary Dressing: ABD Pad 5x9 (in/in) 3 x Per Week/30 Days Discharge Instructions: 2. Cover with ABD pad Secured With: ACE WRAP - 569M ACE Elastic Bandage With VELCRO Brand Closure, 4 (in) 3 x Per Week/30 Days Discharge Instructions: 4. Ace wrap to secure dressing in place and control swelling. Secured With: [employer] or Non-Sterile 6-ply 4.5x4 (yd/yd) 3 x Per Week/30 Days Discharge Instructions: 3. Apply Kerlix as directed 1. I am going to  recommend that we continue with the wound care measures as before and the patient is in agreement with the plan. This includes the use of the Xeroform gauze dressing which I think is still probably the best way to go here. 2. I am also can recommend that the patient continue to monitor for any signs of worsening or infection. If anything changes she should let me know. Otherwise the plan will be to continue with the ABD pads, roll gauze, and Ace wraps over the legs.  [name], [name] ([id number]) 3. She did have an area that looks like it is attempting to breakdown in regard to the right heel I explained that she needs to be wearing the [employer] offloading boots at all times the patient voiced understanding she states she really has not been wearing this at all times. We will see patient back for reevaluation in 1 week here in the clinic. If anything worsens or changes patient will contact our office for additional recommendations. Electronic Signature(s) Signed: 12/24/2021 4:50:21 PM By: Worthy Keeler PA-C Entered By: Worthy Keeler on 12/24/2021 16:50:20 [name] ([id number]) -------------------------------------------------------------------------------- SuperBill Details Patient Name: [name] Date of Service: 12/24/2021 Medical Record Number: [id number] Patient Account Number: [id number] Date of Birth/Sex: [birth year] (66 y.o. F) Treating RN: Carlene Coria Primary Care Provider: [name] Other Clinician: Massie Kluver Referring Provider: [name] Treating Provider/Extender: Skipper Cliche in Treatment: 29 Diagnosis Coding ICD-10 Codes Code Description I89.0 Lymphedema, not elsewhere classified E11.622 Type 2 diabetes mellitus with other skin ulcer E11.40 Type 2 diabetes mellitus with diabetic neuropathy, unspecified L89.893 Pressure ulcer of other site, stage 3 L89.896 Pressure-induced deep tissue damage of other site L89.153 Pressure ulcer of  sacral region, stage 3 I10 Essential (primary) hypertension E03.8 Other specified hypothyroidism E66.09 Other obesity due to excess calories Facility Procedures CPT4 Code: 47096283 Description: 99214 - WOUND CARE VISIT-LEV 4 EST PT Modifier: Quantity: 1 Physician Procedures CPT4 Code: [id number] Description: 99213 - WC PHYS LEVEL 3 - EST PT Modifier: Quantity: 1 CPT4 Code: Description: ICD-10 Diagnosis Description I89.0 Lymphedema, not elsewhere classified E11.622 Type 2 diabetes mellitus with other skin ulcer E11.40 Type 2 diabetes mellitus with diabetic neuropathy, unspecified L89.893 Pressure ulcer of other site, stage  3 Modifier: Quantity: Electronic Signature(s) Signed: 12/24/2021 4:53:35 PM By: Worthy Keeler PA-C Previous Signature: 12/24/2021 4:25:13 PM Version By: Massie Kluver Entered By: Worthy Keeler on 12/24/2021 16:53:35

## 2021-12-24 NOTE — Progress Notes (Signed)
[name], [name] ([id number]) Visit Report for 12/24/2021 Arrival Information Details Patient Name: [name], SEDER. Date of Service: 12/24/2021 10:30 AM Medical Record Number: [id number] Patient Account Number: [id number] Date of Birth/Sex: [birth year] (66 y.o. F) Treating RN: Carlene Coria Primary Care Adian Jablonowski: [name] Other Clinician: Massie Kluver Referring Cozy Veale: [name] Treating Corry Storie/Extender: Skipper Cliche in Treatment: 29 Visit Information History Since Last Visit All ordered tests and consults were completed: No Patient Arrived: Stretcher Added or deleted any medications: No Arrival Time: 10:38 Any new allergies or adverse reactions: No Transfer Assistance: Stretcher Had a fall or experienced change in No Patient Requires Transmission-Based No activities of daily living that may affect Precautions: risk of falls: Patient Has Alerts: Yes Hospitalized since last visit: No Patient Alerts: Patient on Blood Pain Present Now: Yes Thinner TYPE II Diabetic Plavix and Lovenox [place] Signature(s) Signed: 12/24/2021 4:25:13 PM By: Massie Kluver Entered By: Massie Kluver on 12/24/2021 11:42:15 [name] ([id number]) -------------------------------------------------------------------------------- Clinic Level of Care Assessment Details Patient Name: [name]. Date of Service: 12/24/2021 10:30 AM Medical Record Number: [id number] Patient Account Number: [id number] Date of Birth/Sex: 08-09-1955 (66 y.o. F) Treating RN: Carlene Coria Primary Care Dechelle Attaway: [name] Other Clinician: Massie Kluver Referring Raymund Manrique: [name] Treating Niccolo Burggraf/Extender: Skipper Cliche in Treatment: 29 Clinic Level of Care Assessment Items TOOL 4 Quantity Score []  - Use when only an EandM is performed on FOLLOW-UP visit 0 ASSESSMENTS - Nursing Assessment / Reassessment []  - Reassessment of Co-morbidities  (includes updates in patient status) 0 []  - 0 Reassessment of Adherence to Treatment Plan ASSESSMENTS - Wound and Skin Assessment / Reassessment []  - Simple Wound Assessment / Reassessment - one wound 0 X- 3 5 Complex Wound Assessment / Reassessment - multiple wounds []  - 0 Dermatologic / Skin Assessment (not related to wound area) ASSESSMENTS - Focused Assessment []  - Circumferential Edema Measurements - multi extremities 0 []  - 0 Nutritional Assessment / Counseling / Intervention []  - 0 Lower Extremity Assessment (monofilament, tuning fork, pulses) []  - 0 Peripheral Arterial Disease Assessment (using hand held doppler) ASSESSMENTS - Ostomy and/or Continence Assessment and Care []  - Incontinence Assessment and Management 0 []  - 0 Ostomy Care Assessment and Management (repouching, etc.) PROCESS - Coordination of Care X - Simple Patient / Family Education for ongoing care 1 15 []  - 0 Complex (extensive) Patient / Family Education for ongoing care X- 1 10 Staff obtains Programmer, systems, Records, Test Results / Process Orders []  - 0 Staff telephones HHA, Nursing Homes / Clarify orders / etc []  - 0 Routine Transfer to another Facility (non-emergent condition) []  - 0 Routine Hospital Admission (non-emergent condition) []  - 0 New Admissions / Biomedical engineer / Ordering NPWT, Apligraf, etc. []  - 0 Emergency Hospital Admission (emergent condition) X- 1 10 Simple Discharge Coordination []  - 0 Complex (extensive) Discharge Coordination PROCESS - Special Needs []  - Pediatric / Minor Patient Management 0 []  - 0 Isolation Patient Management []  - 0 Hearing / Language / Visual special needs []  - 0 Assessment of Community assistance (transportation, D/C planning, etc.) []  - 0 Additional assistance / Altered mentation []  - 0 Support Surface(s) Assessment (bed, cushion, seat, etc.) INTERVENTIONS - Wound Cleansing / Measurement Doby, Janazia K. ([id number]) []  - 0 Simple Wound  Cleansing - one wound X- 3 5 Complex Wound Cleansing - multiple wounds X- 1 5 Wound Imaging (photographs - any number of wounds) []  - 0 Wound Tracing (instead of photographs) []  -  0 Simple Wound Measurement - one wound X- 3 5 Complex Wound Measurement - multiple wounds INTERVENTIONS - Wound Dressings []  - Small Wound Dressing one or multiple wounds 0 []  - 0 Medium Wound Dressing one or multiple wounds X- 3 20 Large Wound Dressing one or multiple wounds []  - 0 Application of Medications - topical []  - 0 Application of Medications - injection INTERVENTIONS - Miscellaneous []  - External ear exam 0 []  - 0 Specimen Collection (cultures, biopsies, blood, body fluids, etc.) []  - 0 Specimen(s) / Culture(s) sent or taken to Lab for analysis []  - 0 Patient Transfer (multiple staff / Civil Service fast streamer / Similar devices) []  - 0 Simple Staple / Suture removal (25 or less) []  - 0 Complex Staple / Suture removal (26 or more) []  - 0 Hypo / Hyperglycemic Management (close monitor of Blood Glucose) []  - 0 Ankle / Brachial Index (ABI) - do not check if billed separately X- 1 5 Vital Signs Has the patient been seen at the hospital within the last three years: Yes Total Score: 150 Level Of Care: New/Established - Level 4 Electronic Signature(s) Signed: 12/24/2021 4:25:13 PM By: Massie Kluver Entered By: Massie Kluver on 12/24/2021 11:30:14 [name] ([id number]) -------------------------------------------------------------------------------- Encounter Discharge Information Details Patient Name: [name]. Date of Service: 12/24/2021 10:30 AM Medical Record Number: [id number] Patient Account Number: [id number] Date of Birth/Sex: 11/17/1955 (66 y.o. F) Treating RN: Carlene Coria Primary Care Taylan Marez: [name] Other Clinician: Massie Kluver Referring Tasmin Exantus: [name] Treating Commodore Bellew/Extender: Skipper Cliche in Treatment: 29 Encounter Discharge Information  Items Discharge Condition: Stable Ambulatory Status: Stretcher Discharge Destination: Skilled Nursing Facility Orders Sent: Yes Transportation: Ambulance Schedule Follow-up Appointment: Yes Clinical Summary of Care: Electronic Signature(s) Signed: 12/24/2021 4:25:13 PM By: Massie Kluver Entered By: Massie Kluver on 12/24/2021 11:31:57 [name] ([id number]) -------------------------------------------------------------------------------- Lower Extremity Assessment Details Patient Name: [name] Date of Service: 12/24/2021 10:30 AM Medical Record Number: [id number] Patient Account Number: [id number] Date of Birth/Sex: 1955-09-22 (66 y.o. F) Treating RN: Carlene Coria Primary Care Roch Quach: [name] Other Clinician: Massie Kluver Referring Lotus Santillo: [name] Treating Sheala Dosh/Extender: Jeri Cos Weeks in Treatment: 29 Electronic Signature(s) Signed: 12/24/2021 3:32:03 PM By: Carlene Coria RN Signed: 12/24/2021 4:25:13 PM By: Massie Kluver Entered By: Massie Kluver on 12/24/2021 11:08:18 [name] ([id number]) -------------------------------------------------------------------------------- Multi Wound Chart Details Patient Name: [name]. Date of Service: 12/24/2021 10:30 AM Medical Record Number: [id number] Patient Account Number: [id number] Date of Birth/Sex: 06-15-1956 (66 y.o. F) Treating RN: Carlene Coria Primary Care Jeanice Dempsey: [name] Other Clinician: Massie Kluver Referring Cheryln Balcom: [name] Treating Reyan Helle/Extender: Skipper Cliche in Treatment: 29 Photos: [60:No Photos] [61:No Photos] [62:No Photos] Wound Location: [60:Right, Posterior Lower Leg] [61:Left, Posterior Lower Leg] [62:Left, Midline, Posterior Lower Leg] Wounding Event: [60:Pressure Injury] [61:Gradually Appeared] [62:Gradually Appeared] Primary Etiology: [60:Pressure Ulcer] [61:Lymphedema] [62:Lymphedema] Date Acquired: [60:05/17/2021]  [61:06/11/2021] [62:06/11/2021] Weeks of Treatment: [60:29] [61:26] [62:26] Wound Status: [60:Open] [61:Open] [62:Open] Wound Recurrence: [60:No] [61:No] [62:No] Measurements L x W x D (cm) [60:6.4x8.5x0.1] [61:5.9x1.6x0.1] [62:15.4x3.4x0.6] Area (cm) : [60:42.726] [61:7.414] [53:66.440] Volume (cm) : [34:7.425] [61:0.741] [95:63.875] % Reduction in Area: [60:-7462.10%] [61:76.70%] [62:-4298.20%] % Reduction in Volume: [60:-7396.50%] [61:76.70%] [62:-26431.20%] Classification: [60:Category/Stage III] [61:Full Thickness With Exposed Support Structures] [62:Full Thickness Without Exposed Support Structures] Exudate Amount: [60:Large] [61:Large] [62:Large] Exudate Type: [60:Sanguinous red] [61:Serosanguineous red, brown] [62:Sanguinous red] Treatment Notes Electronic Signature(s) Signed: 12/24/2021 4:25:13 PM By: Massie Kluver Entered By: Massie Kluver on 12/24/2021 11:08:32 [name] ([id number]) --------------------------------------------------------------------------------  Multi-Disciplinary Care Plan Details Patient Name: [name], EDBERG. Date of Service: 12/24/2021 10:30 AM Medical Record Number: [id number] Patient Account Number: [id number] Date of Birth/Sex: 06/04/1956 (66 y.o. F) Treating RN: Carlene Coria Primary Care Alexandria Shiflett: [name] Other Clinician: Massie Kluver Referring Marijose Curington: [name] Treating Jacinto Keil/Extender: Skipper Cliche in Treatment: 29 Active Inactive Necrotic Tissue Nursing Diagnoses: Impaired tissue integrity related to necrotic/devitalized tissue Knowledge deficit related to management of necrotic/devitalized tissue Goals: Necrotic/devitalized tissue will be minimized in the wound bed Date Initiated: 11/26/2021 Target Resolution Date: 11/26/2021 Goal Status: Active Patient/caregiver will verbalize understanding of reason and process for debridement of necrotic tissue Date Initiated: 11/26/2021 Target Resolution Date:  11/26/2021 Goal Status: Active Interventions: Assess patient pain level pre-, during and post procedure and prior to discharge Provide education on necrotic tissue and debridement process Treatment Activities: Apply topical anesthetic as ordered : 11/26/2021 Notes: Venous Leg Ulcer Nursing Diagnoses: Actual venous Insuffiency (use after diagnosis is confirmed) Knowledge deficit related to disease process and management Potential for venous Insuffiency (use before diagnosis confirmed) Goals: Patient will maintain optimal edema control Date Initiated: 11/26/2021 Target Resolution Date: 11/26/2021 Goal Status: Active Interventions: Assess peripheral edema status every visit. Notes: Wound/Skin Impairment Nursing Diagnoses: Impaired tissue integrity Knowledge deficit related to smoking impact on wound healing Knowledge deficit related to ulceration/compromised skin integrity Goals: Patient/caregiver will verbalize understanding of skin care regimen Date Initiated: 06/03/2021 Target Resolution Date: 06/03/2021 Goal Status: Active Interventions: Assess ulceration(s) every visit [name], [name] ([id number]) Provide education on ulcer and skin care Treatment Activities: Skin care regimen initiated : 06/03/2021 Topical wound management initiated : 06/03/2021 Notes: Electronic Signature(s) Signed: 12/24/2021 3:32:03 PM By: Carlene Coria RN Signed: 12/24/2021 4:25:13 PM By: Massie Kluver Entered By: Massie Kluver on 12/24/2021 11:08:23 [name] ([id number]) -------------------------------------------------------------------------------- Pain Assessment Details Patient Name: [name]. Date of Service: 12/24/2021 10:30 AM Medical Record Number: [id number] Patient Account Number: [id number] Date of Birth/Sex: [birth year] (66 y.o. F) Treating RN: Carlene Coria Primary Care Kanai Hilger: [name] Other Clinician: Massie Kluver Referring Mariona Scholes: [name] Treating  Talyn Dessert/Extender: Skipper Cliche in Treatment: 29 Active Problems Location of Pain Severity and Description of Pain Patient Has Paino Yes Site Locations Pain Location: Generalized Pain, Pain in Ulcers Duration of the Pain. Constant / Intermittento Constant Rate the pain. Current Pain Level: 8 Character of Pain Describe the Pain: Aching, Burning Pain Management and Medication Current Pain Management: Medication: Yes Rest: Yes Notes pain in heels Electronic Signature(s) Signed: 12/24/2021 3:32:03 PM By: Carlene Coria RN Signed: 12/24/2021 4:25:13 PM By: Massie Kluver Entered By: Massie Kluver on 12/24/2021 13:51:43 [name] ([id number]) -------------------------------------------------------------------------------- Patient/Caregiver Education Details Patient Name: [name]. Date of Service: 12/24/2021 10:30 AM Medical Record Number: [id number] Patient Account Number: [id number] Date of Birth/Gender: [birth year] (66 y.o. F) Treating RN: Carlene Coria Primary Care Physician: [name] Other Clinician: Massie Kluver Referring Physician: [name] Treating Physician/Extender: Skipper Cliche in Treatment: 29 Education Assessment Education Provided To: Patient Education Topics Provided Wound/Skin Impairment: Handouts: Other: continue wound care as directed Electronic Signature(s) Signed: 12/24/2021 4:25:13 PM By: Massie Kluver Entered By: Massie Kluver on 12/24/2021 11:31:03 [name] ([id number]) -------------------------------------------------------------------------------- Wound Assessment Details Patient Name: [name] Date of Service: 12/24/2021 10:30 AM Medical Record Number: [id number] Patient Account Number: [id number] Date of Birth/Sex: 1955-12-13 (66 y.o. F) Treating RN: Carlene Coria Primary Care Shamus Desantis: [name] Other Clinician: Massie Kluver Referring Sabrinna Yearwood: [name] Treating  Katriel Cutsforth/Extender: Jeri Cos Weeks in Treatment:  29 Wound Status Wound Number: 60 Primary Pressure Ulcer Etiology: Wound Location: Right, Posterior Lower Leg Wound Open Wounding Event: Pressure Injury Status: Date Acquired: 05/17/2021 Comorbid Lymphedema, Hypertension, Peripheral Arterial Disease, Weeks Of Treatment: 29 History: Peripheral Venous Disease, Type II Diabetes, End Stage Clustered Wound: No Renal Disease, Osteoarthritis, Neuropathy Photos Wound Measurements Length: (cm) 6.4 Width: (cm) 8.5 Depth: (cm) 0.1 Area: (cm) 42.726 Volume: (cm) 4.273 % Reduction in Area: -7462.1% % Reduction in Volume: -7396.5% Wound Description Classification: Category/Stage III Exudate Amount: Large Exudate Type: Sanguinous Exudate Color: red Foul Odor After Cleansing: No Slough/Fibrino Yes Wound Bed Granulation Amount: Small (1-33%) Granulation Quality: Red Necrotic Amount: Small (1-33%) Necrotic Quality: Adherent Slough Treatment Notes Wound #60 (Lower Leg) Wound Laterality: Right, Posterior Cleanser Normal Saline Discharge Instruction: Wash your hands with soap and water. Remove old dressing, discard into plastic bag and place into trash. Cleanse the wound with Normal Saline prior to applying a clean dressing using gauze sponges, not tissues or cotton balls. Do not scrub or use excessive force. Pat dry using gauze sponges, not tissue or cotton balls. Peri-Wound Care Topical Irmen, Aminat K. ([id number]) Primary Dressing Xeroform 5x9-HBD (in/in) Discharge Instruction: 1. Apply Xeroform 5x9-HBD (in/in) as directed Secondary Dressing ABD Pad 5x9 (in/in) Discharge Instruction: 2. Cover with ABD pad Secured With ACE WRAP - 90M ACE Elastic Bandage With VELCRO Brand Closure, 4 (in) Discharge Instruction: 4. Ace wrap to secure dressing in place and control swelling. Kerlix Roll Sterile or Non-Sterile 6-ply 4.5x4 (yd/yd) Discharge Instruction: 3. Apply Kerlix as  directed Compression Wrap Compression Stockings Add-Ons Electronic Signature(s) Signed: 12/24/2021 3:32:03 PM By: Carlene Coria RN Signed: 12/24/2021 4:25:13 PM By: Massie Kluver Entered By: Massie Kluver on 12/24/2021 11:12:47 [name] ([id number]) -------------------------------------------------------------------------------- Wound Assessment Details Patient Name: [name]. Date of Service: 12/24/2021 10:30 AM Medical Record Number: [id number] Patient Account Number: [id number] Date of Birth/Sex: 15-May-1956 (66 y.o. F) Treating RN: Carlene Coria Primary Care Nellie Pester: [name] Other Clinician: Massie Kluver Referring Delorise Hunkele: [name] Treating Denita Lun/Extender: Jeri Cos Weeks in Treatment: 29 Wound Status Wound Number: 61 Primary Lymphedema Etiology: Wound Location: Left, Posterior Lower Leg Wound Open Wounding Event: Gradually Appeared Status: Date Acquired: 06/11/2021 Comorbid Lymphedema, Hypertension, Peripheral Arterial Disease, Weeks Of Treatment: 26 History: Peripheral Venous Disease, Type II Diabetes, End Stage Clustered Wound: No Renal Disease, Osteoarthritis, Neuropathy Photos Wound Measurements Length: (cm) 5.9 Width: (cm) 1.6 Depth: (cm) 0.1 Area: (cm) 7.414 Volume: (cm) 0.741 % Reduction in Area: 76.7% % Reduction in Volume: 76.7% Epithelialization: None Tunneling: No Undermining: No Wound Description Classification: Full Thickness With Exposed Support Structures Exudate Amount: Large Exudate Type: Serosanguineous Exudate Color: red, brown Foul Odor After Cleansing: No Slough/Fibrino Yes Wound Bed Granulation Amount: Medium (34-66%) Exposed Structure Granulation Quality: Red Fat Layer (Subcutaneous Tissue) Exposed: Yes Necrotic Amount: Small (1-33%) Necrotic Quality: Adherent Slough Treatment Notes Wound #61 (Lower Leg) Wound Laterality: Left, Posterior Cleanser Normal Saline Discharge Instruction: Wash your  hands with soap and water. Remove old dressing, discard into plastic bag and place into trash. Cleanse the wound with Normal Saline prior to applying a clean dressing using gauze sponges, not tissues or cotton balls. Do not scrub or use excessive force. Pat dry using gauze sponges, not tissue or cotton balls. Peri-Wound Care Topical Randle, Aneira K. ([id number]) Primary Dressing Xeroform 5x9-HBD (in/in) Discharge Instruction: 1. Apply Xeroform 5x9-HBD (in/in) as directed Secondary Dressing ABD Pad 5x9 (in/in) Discharge Instruction: 2. Cover with ABD pad Secured With ACE WRAP - 90M  ACE Elastic Bandage With VELCRO Brand Closure, 4 (in) Discharge Instruction: 4. Ace wrap to secure dressing in place and control swelling. Kerlix Roll Sterile or Non-Sterile 6-ply 4.5x4 (yd/yd) Discharge Instruction: 3. Apply Kerlix as directed Compression Wrap Compression Stockings Add-Ons Electronic Signature(s) Signed: 12/24/2021 3:32:03 PM By: Carlene Coria RN Signed: 12/24/2021 4:25:13 PM By: Massie Kluver Entered By: Massie Kluver on 12/24/2021 11:13:41 [name] ([id number]) -------------------------------------------------------------------------------- Wound Assessment Details Patient Name: [name]. Date of Service: 12/24/2021 10:30 AM Medical Record Number: [id number] Patient Account Number: [id number] Date of Birth/Sex: 17-Jul-1955 (66 y.o. F) Treating RN: Carlene Coria Primary Care Arriana Lohmann: [name] Other Clinician: Massie Kluver Referring Lorilynn Lehr: [name] Treating Jak Haggar/Extender: Jeri Cos Weeks in Treatment: 29 Wound Status Wound Number: 62 Primary Lymphedema Etiology: Wound Location: Left, Midline, Posterior Lower Leg Wound Open Wounding Event: Gradually Appeared Status: Date Acquired: 06/11/2021 Comorbid Lymphedema, Hypertension, Peripheral Arterial Disease, Weeks Of Treatment: 26 History: Peripheral Venous Disease, Type II Diabetes, End  Stage Clustered Wound: No Renal Disease, Osteoarthritis, Neuropathy Photos Wound Measurements Length: (cm) 15.4 Width: (cm) 3.4 Depth: (cm) 0.6 Area: (cm) 41.123 Volume: (cm) 24.674 % Reduction in Area: -4298.2% % Reduction in Volume: -26431.2% Epithelialization: None Tunneling: No Undermining: No Wound Description Classification: Full Thickness Without Exposed Support Structu Exudate Amount: Large Exudate Type: Sanguinous Exudate Color: red res Wound Bed Granulation Amount: Small (1-33%) Exposed Structure Granulation Quality: Red, Pale Fat Layer (Subcutaneous Tissue) Exposed: Yes Necrotic Amount: Small (1-33%) Necrotic Quality: Adherent Slough Treatment Notes Wound #62 (Lower Leg) Wound Laterality: Left, Midline, Posterior Cleanser Normal Saline Discharge Instruction: Wash your hands with soap and water. Remove old dressing, discard into plastic bag and place into trash. Cleanse the wound with Normal Saline prior to applying a clean dressing using gauze sponges, not tissues or cotton balls. Do not scrub or use excessive force. Pat dry using gauze sponges, not tissue or cotton balls. Peri-Wound Care Topical Twardowski, Smriti K. ([id number]) Primary Dressing Xeroform 5x9-HBD (in/in) Discharge Instruction: 1. Apply Xeroform 5x9-HBD (in/in) as directed Secondary Dressing ABD Pad 5x9 (in/in) Discharge Instruction: 2. Cover with ABD pad Secured With ACE WRAP - 94M ACE Elastic Bandage With VELCRO Brand Closure, 4 (in) Discharge Instruction: 4. Ace wrap to secure dressing in place and control swelling. Kerlix Roll Sterile or Non-Sterile 6-ply 4.5x4 (yd/yd) Discharge Instruction: 3. Apply Kerlix as directed Compression Wrap Compression Stockings Add-Ons Electronic Signature(s) Signed: 12/24/2021 3:32:03 PM By: Carlene Coria RN Signed: 12/24/2021 4:25:13 PM By: Massie Kluver Entered By: Massie Kluver on 12/24/2021 11:14:40 [name]  ([id number]) -------------------------------------------------------------------------------- Vitals Details Patient Name: [name] Date of Service: 12/24/2021 10:30 AM Medical Record Number: [id number] Patient Account Number: [id number] Date of Birth/Sex: December 15, 1955 (66 y.o. F) Treating RN: Carlene Coria Primary Care Karalina Tift: [name] Other Clinician: Massie Kluver Referring Eliane Hammersmith: [name] Treating Adine Heimann/Extender: Skipper Cliche in Treatment: 29 Vital Signs Time Taken: 10:30 Temperature (F): 98.0 Height (in): 65 Pulse (bpm): 105 Weight (lbs): 245.4 Respiratory Rate (breaths/min): 16 Body Mass Index (BMI): 40.8 Blood Pressure (mmHg): 132/79 Reference Range: 80 - 120 mg / dl Electronic Signature(s) Signed: 12/24/2021 4:25:13 PM By: Massie Kluver Entered By: Massie Kluver on 12/24/2021 13:51:25

## 2021-12-29 DIAGNOSIS — G8929 Other chronic pain: Secondary | ICD-10-CM | POA: Diagnosis not present

## 2021-12-29 DIAGNOSIS — E119 Type 2 diabetes mellitus without complications: Secondary | ICD-10-CM | POA: Diagnosis not present

## 2021-12-29 DIAGNOSIS — I509 Heart failure, unspecified: Secondary | ICD-10-CM | POA: Diagnosis not present

## 2021-12-29 DIAGNOSIS — E039 Hypothyroidism, unspecified: Secondary | ICD-10-CM | POA: Diagnosis not present

## 2021-12-29 DIAGNOSIS — F330 Major depressive disorder, recurrent, mild: Secondary | ICD-10-CM | POA: Diagnosis not present

## 2021-12-29 DIAGNOSIS — R69 Illness, unspecified: Secondary | ICD-10-CM | POA: Diagnosis not present

## 2021-12-29 DIAGNOSIS — F411 Generalized anxiety disorder: Secondary | ICD-10-CM | POA: Diagnosis not present

## 2021-12-29 DIAGNOSIS — I10 Essential (primary) hypertension: Secondary | ICD-10-CM | POA: Diagnosis not present

## 2021-12-29 DIAGNOSIS — F4320 Adjustment disorder, unspecified: Secondary | ICD-10-CM | POA: Diagnosis not present

## 2022-01-05 DIAGNOSIS — F411 Generalized anxiety disorder: Secondary | ICD-10-CM | POA: Diagnosis not present

## 2022-01-05 DIAGNOSIS — F330 Major depressive disorder, recurrent, mild: Secondary | ICD-10-CM | POA: Diagnosis not present

## 2022-01-05 DIAGNOSIS — R69 Illness, unspecified: Secondary | ICD-10-CM | POA: Diagnosis not present

## 2022-01-10 DIAGNOSIS — I87332 Chronic venous hypertension (idiopathic) with ulcer and inflammation of left lower extremity: Secondary | ICD-10-CM | POA: Diagnosis not present

## 2022-01-11 DIAGNOSIS — R69 Illness, unspecified: Secondary | ICD-10-CM | POA: Diagnosis not present

## 2022-01-11 DIAGNOSIS — Z79899 Other long term (current) drug therapy: Secondary | ICD-10-CM | POA: Diagnosis not present

## 2022-01-11 DIAGNOSIS — F330 Major depressive disorder, recurrent, mild: Secondary | ICD-10-CM | POA: Diagnosis not present

## 2022-01-11 DIAGNOSIS — G8929 Other chronic pain: Secondary | ICD-10-CM | POA: Diagnosis not present

## 2022-01-11 DIAGNOSIS — F411 Generalized anxiety disorder: Secondary | ICD-10-CM | POA: Diagnosis not present

## 2022-01-12 DIAGNOSIS — I509 Heart failure, unspecified: Secondary | ICD-10-CM | POA: Diagnosis not present

## 2022-01-12 DIAGNOSIS — E119 Type 2 diabetes mellitus without complications: Secondary | ICD-10-CM | POA: Diagnosis not present

## 2022-01-12 DIAGNOSIS — I10 Essential (primary) hypertension: Secondary | ICD-10-CM | POA: Diagnosis not present

## 2022-01-12 DIAGNOSIS — F411 Generalized anxiety disorder: Secondary | ICD-10-CM | POA: Diagnosis not present

## 2022-01-12 DIAGNOSIS — I890 Lymphedema, not elsewhere classified: Secondary | ICD-10-CM | POA: Diagnosis not present

## 2022-01-12 DIAGNOSIS — R1310 Dysphagia, unspecified: Secondary | ICD-10-CM | POA: Diagnosis not present

## 2022-01-12 DIAGNOSIS — F330 Major depressive disorder, recurrent, mild: Secondary | ICD-10-CM | POA: Diagnosis not present

## 2022-01-12 DIAGNOSIS — E039 Hypothyroidism, unspecified: Secondary | ICD-10-CM | POA: Diagnosis not present

## 2022-01-12 DIAGNOSIS — G8929 Other chronic pain: Secondary | ICD-10-CM | POA: Diagnosis not present

## 2022-01-12 DIAGNOSIS — R69 Illness, unspecified: Secondary | ICD-10-CM | POA: Diagnosis not present

## 2022-01-18 DIAGNOSIS — E039 Hypothyroidism, unspecified: Secondary | ICD-10-CM | POA: Diagnosis not present

## 2022-01-18 DIAGNOSIS — I509 Heart failure, unspecified: Secondary | ICD-10-CM | POA: Diagnosis not present

## 2022-01-18 DIAGNOSIS — K5900 Constipation, unspecified: Secondary | ICD-10-CM | POA: Diagnosis not present

## 2022-01-18 DIAGNOSIS — E119 Type 2 diabetes mellitus without complications: Secondary | ICD-10-CM | POA: Diagnosis not present

## 2022-01-18 DIAGNOSIS — I10 Essential (primary) hypertension: Secondary | ICD-10-CM | POA: Diagnosis not present

## 2022-01-18 DIAGNOSIS — R69 Illness, unspecified: Secondary | ICD-10-CM | POA: Diagnosis not present

## 2022-01-18 DIAGNOSIS — Z79899 Other long term (current) drug therapy: Secondary | ICD-10-CM | POA: Diagnosis not present

## 2022-01-20 ENCOUNTER — Ambulatory Visit: Payer: Medicare HMO | Admitting: Internal Medicine

## 2022-01-20 DIAGNOSIS — E039 Hypothyroidism, unspecified: Secondary | ICD-10-CM | POA: Diagnosis not present

## 2022-01-20 DIAGNOSIS — R69 Illness, unspecified: Secondary | ICD-10-CM | POA: Diagnosis not present

## 2022-01-20 DIAGNOSIS — I890 Lymphedema, not elsewhere classified: Secondary | ICD-10-CM | POA: Diagnosis not present

## 2022-01-20 DIAGNOSIS — E119 Type 2 diabetes mellitus without complications: Secondary | ICD-10-CM | POA: Diagnosis not present

## 2022-01-20 DIAGNOSIS — G8929 Other chronic pain: Secondary | ICD-10-CM | POA: Diagnosis not present

## 2022-01-20 DIAGNOSIS — I509 Heart failure, unspecified: Secondary | ICD-10-CM | POA: Diagnosis not present

## 2022-01-20 DIAGNOSIS — K5900 Constipation, unspecified: Secondary | ICD-10-CM | POA: Diagnosis not present

## 2022-01-20 DIAGNOSIS — Z79899 Other long term (current) drug therapy: Secondary | ICD-10-CM | POA: Diagnosis not present

## 2022-01-26 DIAGNOSIS — R69 Illness, unspecified: Secondary | ICD-10-CM | POA: Diagnosis not present

## 2022-01-26 DIAGNOSIS — F411 Generalized anxiety disorder: Secondary | ICD-10-CM | POA: Diagnosis not present

## 2022-01-26 DIAGNOSIS — F330 Major depressive disorder, recurrent, mild: Secondary | ICD-10-CM | POA: Diagnosis not present

## 2022-01-31 DIAGNOSIS — L603 Nail dystrophy: Secondary | ICD-10-CM | POA: Diagnosis not present

## 2022-01-31 DIAGNOSIS — Z89411 Acquired absence of right great toe: Secondary | ICD-10-CM | POA: Diagnosis not present

## 2022-01-31 DIAGNOSIS — I739 Peripheral vascular disease, unspecified: Secondary | ICD-10-CM | POA: Diagnosis not present

## 2022-02-02 DIAGNOSIS — I87331 Chronic venous hypertension (idiopathic) with ulcer and inflammation of right lower extremity: Secondary | ICD-10-CM | POA: Diagnosis not present

## 2022-02-02 DIAGNOSIS — I87332 Chronic venous hypertension (idiopathic) with ulcer and inflammation of left lower extremity: Secondary | ICD-10-CM | POA: Diagnosis not present

## 2022-02-03 ENCOUNTER — Encounter: Payer: Medicare HMO | Attending: Physician Assistant | Admitting: Physician Assistant

## 2022-02-03 DIAGNOSIS — Z7401 Bed confinement status: Secondary | ICD-10-CM | POA: Diagnosis not present

## 2022-02-03 DIAGNOSIS — N186 End stage renal disease: Secondary | ICD-10-CM | POA: Insufficient documentation

## 2022-02-03 DIAGNOSIS — E11622 Type 2 diabetes mellitus with other skin ulcer: Secondary | ICD-10-CM | POA: Diagnosis not present

## 2022-02-03 DIAGNOSIS — L89153 Pressure ulcer of sacral region, stage 3: Secondary | ICD-10-CM | POA: Insufficient documentation

## 2022-02-03 DIAGNOSIS — I890 Lymphedema, not elsewhere classified: Secondary | ICD-10-CM | POA: Insufficient documentation

## 2022-02-03 DIAGNOSIS — W19XXXA Unspecified fall, initial encounter: Secondary | ICD-10-CM | POA: Diagnosis not present

## 2022-02-03 DIAGNOSIS — L97222 Non-pressure chronic ulcer of left calf with fat layer exposed: Secondary | ICD-10-CM | POA: Diagnosis not present

## 2022-02-03 DIAGNOSIS — L89610 Pressure ulcer of right heel, unstageable: Secondary | ICD-10-CM | POA: Diagnosis not present

## 2022-02-03 DIAGNOSIS — L89893 Pressure ulcer of other site, stage 3: Secondary | ICD-10-CM | POA: Diagnosis not present

## 2022-02-03 DIAGNOSIS — S81809A Unspecified open wound, unspecified lower leg, initial encounter: Secondary | ICD-10-CM | POA: Diagnosis not present

## 2022-02-03 DIAGNOSIS — E1140 Type 2 diabetes mellitus with diabetic neuropathy, unspecified: Secondary | ICD-10-CM | POA: Diagnosis not present

## 2022-02-03 DIAGNOSIS — L89896 Pressure-induced deep tissue damage of other site: Secondary | ICD-10-CM | POA: Insufficient documentation

## 2022-02-03 DIAGNOSIS — E1122 Type 2 diabetes mellitus with diabetic chronic kidney disease: Secondary | ICD-10-CM | POA: Insufficient documentation

## 2022-02-03 DIAGNOSIS — E1151 Type 2 diabetes mellitus with diabetic peripheral angiopathy without gangrene: Secondary | ICD-10-CM | POA: Insufficient documentation

## 2022-02-03 DIAGNOSIS — I959 Hypotension, unspecified: Secondary | ICD-10-CM | POA: Diagnosis not present

## 2022-02-03 DIAGNOSIS — Z743 Need for continuous supervision: Secondary | ICD-10-CM | POA: Diagnosis not present

## 2022-02-03 DIAGNOSIS — I120 Hypertensive chronic kidney disease with stage 5 chronic kidney disease or end stage renal disease: Secondary | ICD-10-CM | POA: Insufficient documentation

## 2022-02-03 DIAGNOSIS — L97822 Non-pressure chronic ulcer of other part of left lower leg with fat layer exposed: Secondary | ICD-10-CM | POA: Diagnosis not present

## 2022-02-03 NOTE — Progress Notes (Addendum)
[name], [name] ([id number]) Visit Report for 02/03/2022 Chief Complaint Document Details Patient Name: [name], [name]. Date of Service: 02/03/2022 12:45 PM Medical Record Number: [id number] Patient Account Number: [id number] Date of Birth/Sex: Oct 16, 1955 (66 y.o. F) Treating RN: Cornell Barman Primary Care Provider: [name] Other Clinician: Massie Kluver Referring Provider: [name] Treating Provider/Extender: Skipper Cliche in Treatment: 58 Information Obtained from: Patient Chief Complaint Bilateral lower extremity phlebolymphedema and recurrent calf ulcerations. New sacral wound as of 10/29/21 Electronic Signature(s) Signed: 02/03/2022 12:55:19 PM By: Worthy Keeler PA-C Entered By: Worthy Keeler on 02/03/2022 12:55:19 [name] ([id number]) -------------------------------------------------------------------------------- HPI Details Patient Name: [name] Date of Service: 02/03/2022 12:45 PM Medical Record Number: [id number] Patient Account Number: [id number] Date of Birth/Sex: 05-10-1956 (66 y.o. F) Treating RN: Cornell Barman Primary Care Provider: [name] Other Clinician: Massie Kluver Referring Provider: [name] Treating Provider/Extender: Skipper Cliche in Treatment: 28 History of Present Illness HPI Description: 66yo w/ h/o BLE phlebolymphedema, type 2 DM (unknown hemoglobin A1c), and obesity. No PVD. h/o chronic, recurrent BLE calf ulcers. Treated with 4 layer compression. Infrequently uses lymphedema pump. Bilateral lower extremity ulcerations healed as of June 2016. Fitted for custom compression stockings but did not receive them. Patient could not travel for lymphedema consult. She developed recurrent bilateral lower extremity ulcerations in August 2016. She is able to heal these quite quickly with 4 layer elastic compression bandages. However, she is not very compliant using her juxtalite compression garments and tends to  develop recurrent ulcerations while using juxtalite. Unna boots were applied this past week. She tolerated this well but performs minimal ambulation. She is scheduled to undergo total hip replacement tomorrow with Dr. Rudene Christians. She is unsure if he is aware of her ulcerations. She is otherwise without complaints. No significant pain. No fever or chills. Moderate clear drainage. Not on any antibiotics. 06/04/2015 -- she did have her hip surgery and has now been in rehabilitation and they're controlling her fluid intake and diuretics to the extent that she has lost 13 pounds of weight and her lower extremity circumference is decreased by 7 cm. 06/18/2015 -- she is still in the rehabilitation facility and continues to be looked after well and has lost a total of 15 pounds and her diabetes is under much better control. Readmission: 06/03/2021 upon evaluation today patient actually appears to be doing somewhat poorly in regard to a wound on the right posterior lower leg. Patient does have a history of previously having been seen here in the clinic but this predates my time here back in 2017. Subsequently unfortunately right now she is having a significant issue with bleeding in the posterior of her right calf this appears to be a deep tissue injury as developed into an ulceration. She does have a history significant for lymphedema, diabetes mellitus type 2, diabetic neuropathy, hypertension, hypothyroidism, and obesity. She is currently in a skilled nursing facility. The dressing that she had in place during evaluation today was Xeroform followed by significant amount of gauze and this was severely stuck. Upon removal the patient was having significant bleeding she also had significant work done in the hospital in regard to her blood flow and Dr. Lucky Cowboy does look like he has her on 2 blood thinners Plavix and Lovenox. This is probably part of the reason why she is bleeding so significantly. With that being said  I do believe that based on what we are seeing currently this is probably can to be  something that were getting have to try to get stopped with more than just pressure to the region currently. I think surgery foam is probably can to be necessary. If we can get the stop of the Surgifoam she is probably can have to go to the hospital to try to get this bleeding stopped. 12/30; this is a patient who is currently in a skilled facility. She has wounds on her bilateral calfs likely secondary to severe chronic venous insufficiency and lymphedema the care of these wounds is complicated by the fact that she is on Lovenox and Plavix and bleeds very frequently with even minimal trauma to these wounds. It would appear that she had a Vaseline gauze on the surface of these in the process of undressing them she bled freely from especially the right but also the left. I had to go in and use silver nitrate. The skin on her legs is significant for chronic venous stasis and lymphedema. Very dry flaking skin 07/16/2021; I actually was a patent person who saw this woman 3 weeks ago. She is a nursing facility has chronic venous wounds and bilateral lower extremity wounds. We use Xeroform under compression. The time she was on Lovenox and Plavix I thought I had an excellent reason for these wounds to bleed so freely. Since then the Lovenox has been stopped and should be well out of her system but she is still on Plavix. She still has very friable bleeding wounds on the left lateral left Posterior and right Posterior lower extremity.These believe very freely when we take off the dressing Also she has a left heel wound which when we saw her last time was a surgical wound however I gather we have been asked to follow this as well. I am not exactly sure at this time what her surgery was but she comes in with a wound VAC on this and they are apparently changing it at the nursing home. 08/27/2021 upon evaluation patient appears to  be doing better in regard to her wound on the legs as well as the heel. Fortunately at this point I do not see any need for a wound VAC which is good news overall I think that the Xeroform is probably a good option that is what is being utilized here as well. Fortunately I do not see any signs of active infection locally nor systemically which is great news and overall I think that the patient is making excellent progress. The area on her right gluteal area actually appears to be doing quite well and I am pleased with that. I do believe the silver alginate dressing here could be of benefit for her. 09/17/2021 upon evaluation patient appears to be doing actually better in regard to her wounds on the legs. Fortunately she does not seem to be showing any signs of worsening which is great news and overall very pleased with where we stand. I do believe that she is tolerating the dressing changes without complication which is great news. 10-08-2021 upon evaluation today patient unfortunately continues to have significant issues with her legs. Unfortunately the biggest issue that we see Currently is that she is still having a lot of bleeding from her legs that she is also not able to move well due to her knee pain her left knee is quite significant as far as the discomfort is concerned and the right knee is getting equally as bad. Fortunately I do not see any evidence of active infection locally or systemically which is  great news. Nonetheless I do believe that her heels are looking much better there is a tiny area open on Castrillo, Myrella K. ([id number]) the left heel the right is doing quite well. In regard to the gluteal/sacral region this appears to be healed. 10-29-2021 upon evaluation today patient unfortunately is appearing to do much worse compared to normal. She has more breakdown in regard to the left lateral leg and again I think this is pressure related. She also has more breakdown in regard to the  gluteal area and she is very concerned about this in particular which is completely understandable. With that being said I do believe that she would benefit from more aggressive and appropriate offloading I discussed with her that she needs to allow the facility to change her position every 2 hours and she is can have to find different positions other than just lying on her back. Also discussed with her that obviously the facility needs to make sure to help her with repositioning every 2 hours I think both aspects of this are important. Otherwise her wound on the sacral area is going to continue to get worse and the leg will continue to get worse. 11-26-2021 upon evaluation today patient appears to be doing well with regard to her wounds all things considered. The gluteal areas are all healed which is great news. With that being said she is still having some issues here with her lower extremities. I do think that this is still something that we need to be very mindful of. Unfortunately the facility is still not washing her legs she has a lot of caked on dry skin with Xeroform we will get a try to get some of that off today. With that being said he does have reiterated in the past the patient needs to have her legs washed regularly. 12-24-2021 upon evaluation patient's wounds are showing signs of improvement albeit slowly. Fortunately I do not see any signs of infection locally or systemically which is great news. Nonetheless I do believe that the patient is doing quite well from the standpoint of the overall appearance of her wounds which are better than where they have been even if not where we would like them to be. 02-03-2022 upon evaluation today patient appears to be doing about the same in regard to her wounds. There is maybe a little bit of improvement in general based on what I am seeing currently but not much to be honest. I do think that the patient is making some progress here when it comes to  the overall appearance of her wounds. Electronic Signature(s) Signed: 02/03/2022 2:22:53 PM By: Worthy Keeler PA-C Entered By: Worthy Keeler on 02/03/2022 14:22:53 [name] ([id number]) -------------------------------------------------------------------------------- Physical Exam Details Patient Name: [name] Date of Service: 02/03/2022 12:45 PM Medical Record Number: [id number] Patient Account Number: [id number] Date of Birth/Sex: May 21, 1956 (66 y.o. F) Treating RN: Cornell Barman Primary Care Provider: [name] Other Clinician: Massie Kluver Referring Provider: [name] Treating Provider/Extender: Skipper Cliche in Treatment: 52 Constitutional Well-nourished and well-hydrated in no acute distress. Respiratory normal breathing without difficulty. Psychiatric this patient is able to make decisions and demonstrates good insight into disease process. Alert and Oriented x 3. pleasant and cooperative. Notes Upon inspection patient's wound bed actually showed signs again of improvement in general with regard to what I am seeing I do not see any evidence of worsening from the standpoint of the appearance of the wounds or just new areas  of popup that are more blistered here and there. Overall I do think that that is going to be an ongoing issue to be honest. Electronic Signature(s) Signed: 02/03/2022 2:23:15 PM By: Worthy Keeler PA-C Entered By: Worthy Keeler on 02/03/2022 14:23:15 [name] ([id number]) -------------------------------------------------------------------------------- Physician Orders Details Patient Name: [name] Date of Service: 02/03/2022 12:45 PM Medical Record Number: [id number] Patient Account Number: [id number] Date of Birth/Sex: Mar 06, 1956 (66 y.o. F) Treating RN: Cornell Barman Primary Care Provider: [name] Other Clinician: Massie Kluver Referring Provider: [name] Treating Provider/Extender:  Skipper Cliche in Treatment: 45 Verbal / Phone Orders: No Diagnosis Coding ICD-10 Coding Code Description I89.0 Lymphedema, not elsewhere classified E11.622 Type 2 diabetes mellitus with other skin ulcer E11.40 Type 2 diabetes mellitus with diabetic neuropathy, unspecified L89.893 Pressure ulcer of other site, stage 3 L89.896 Pressure-induced deep tissue damage of other site L89.153 Pressure ulcer of sacral region, stage 3 I10 Essential (primary) hypertension E03.8 Other specified hypothyroidism E66.09 Other obesity due to excess calories Follow-up Appointments Wound #60 Right,Circumferential Lower Leg o Return Appointment in 1 month Bathing/ Shower/ Hygiene o Wash wounds with antibacterial soap and water. - WASH LEGS WITH SOAP and WATER before applying dressings. o No tub bath. Edema Control - Lymphedema / Segmental Compressive Device / Other o Other: - Lymphedema-use ace wraps to help control swelling. Off-Loading o Turn and reposition every 2 hours - PLEASE MAKE SURE THIS IS COMPLETED EVERY TWO HOURS THANK YOU o Other: - Put pillows under knees to keep pressure off of calves. Deep tissue injury on posterior calves. Turn on side every 1 hous to keep pressure off calves wear offloading boots Additional Orders / Instructions o Follow Nutritious Diet and Increase Protein Intake Wound Treatment Wound #60 - Lower Leg Wound Laterality: Right, Circumferential Cleanser: Normal Saline 3 x Per Week/30 Days Discharge Instructions: Wash your hands with soap and water. Remove old dressing, discard into plastic bag and place into trash. Cleanse the wound with Normal Saline prior to applying a clean dressing using gauze sponges, not tissues or cotton balls. Do not scrub or use excessive force. Pat dry using gauze sponges, not tissue or cotton balls. Primary Dressing: Xeroform 5x9-HBD (in/in) 3 x Per Week/30 Days Discharge Instructions: 1. Apply Xeroform 5x9-HBD (in/in) as  directed Secondary Dressing: ABD Pad 5x9 (in/in) 3 x Per Week/30 Days Discharge Instructions: 2. Cover with ABD pad Secured With: ACE WRAP - 64M ACE Elastic Bandage With VELCRO Brand Closure, 4 (in) 3 x Per Week/30 Days Discharge Instructions: 4. Ace wrap to secure dressing in place and control swelling. Secured With: [employer] or Non-Sterile 6-ply 4.5x4 (yd/yd) 3 x Per Week/30 Days Discharge Instructions: 3. Apply Kerlix as directed [name], [name] ([id number]) Wound #61 - Lower Leg Wound Laterality: Left, Posterior Cleanser: Normal Saline 3 x Per Week/30 Days Discharge Instructions: Wash your hands with soap and water. Remove old dressing, discard into plastic bag and place into trash. Cleanse the wound with Normal Saline prior to applying a clean dressing using gauze sponges, not tissues or cotton balls. Do not scrub or use excessive force. Pat dry using gauze sponges, not tissue or cotton balls. Primary Dressing: Xeroform 5x9-HBD (in/in) 3 x Per Week/30 Days Discharge Instructions: 1. Apply Xeroform 5x9-HBD (in/in) as directed Secondary Dressing: ABD Pad 5x9 (in/in) 3 x Per Week/30 Days Discharge Instructions: 2. Cover with ABD pad Secured With: ACE WRAP - 64M ACE Elastic Bandage With VELCRO Brand Closure, 4 (in) 3  x Per Week/30 Days Discharge Instructions: 4. Ace wrap to secure dressing in place and control swelling. Secured With: [employer] or Non-Sterile 6-ply 4.5x4 (yd/yd) 3 x Per Week/30 Days Discharge Instructions: 3. Apply Kerlix as directed Wound #62 - Lower Leg Wound Laterality: Left, Lateral Cleanser: Normal Saline 3 x Per Week/30 Days Discharge Instructions: Wash your hands with soap and water. Remove old dressing, discard into plastic bag and place into trash. Cleanse the wound with Normal Saline prior to applying a clean dressing using gauze sponges, not tissues or cotton balls. Do not scrub or use excessive force. Pat dry using gauze sponges, not tissue  or cotton balls. Primary Dressing: Xeroform 5x9-HBD (in/in) 3 x Per Week/30 Days Discharge Instructions: 1. Apply Xeroform 5x9-HBD (in/in) as directed Secondary Dressing: ABD Pad 5x9 (in/in) 3 x Per Week/30 Days Discharge Instructions: 2. Cover with ABD pad Secured With: ACE WRAP - 56M ACE Elastic Bandage With VELCRO Brand Closure, 4 (in) 3 x Per Week/30 Days Discharge Instructions: 4. Ace wrap to secure dressing in place and control swelling. Secured With: [employer] or Non-Sterile 6-ply 4.5x4 (yd/yd) 3 x Per Week/30 Days Discharge Instructions: 3. Apply Kerlix as directed Electronic Signature(s) Signed: 02/04/2022 4:28:06 PM By: Massie Kluver Signed: 02/04/2022 5:04:29 PM By: Worthy Keeler PA-C Entered By: Massie Kluver on 02/03/2022 14:02:46 [name] ([id number]) -------------------------------------------------------------------------------- Problem List Details Patient Name: [name] Date of Service: 02/03/2022 12:45 PM Medical Record Number: [id number] Patient Account Number: [id number] Date of Birth/Sex: 1956/04/30 (66 y.o. F) Treating RN: Cornell Barman Primary Care Provider: [name] Other Clinician: Massie Kluver Referring Provider: [name] Treating Provider/Extender: Skipper Cliche in Treatment: 35 Active Problems ICD-10 Encounter Code Description Active Date MDM Diagnosis I89.0 Lymphedema, not elsewhere classified 06/03/2021 No Yes E11.622 Type 2 diabetes mellitus with other skin ulcer 06/03/2021 No Yes E11.40 Type 2 diabetes mellitus with diabetic neuropathy, unspecified 06/03/2021 No Yes L89.893 Pressure ulcer of other site, stage 3 06/03/2021 No Yes L89.896 Pressure-induced deep tissue damage of other site 06/03/2021 No Yes L89.153 Pressure ulcer of sacral region, stage 3 08/27/2021 No Yes I10 Essential (primary) hypertension 06/03/2021 No Yes E03.8 Other specified hypothyroidism 06/03/2021 No Yes E66.09 Other obesity due to excess  calories 06/03/2021 No Yes Inactive Problems Resolved Problems Electronic Signature(s) Signed: 02/03/2022 12:55:16 PM By: Worthy Keeler PA-C Entered By: Worthy Keeler on 02/03/2022 12:55:15 [name] ([id number]) -------------------------------------------------------------------------------- Progress Note Details Patient Name: [name]. Date of Service: 02/03/2022 12:45 PM Medical Record Number: [id number] Patient Account Number: [id number] Date of Birth/Sex: Apr 24, 1956 (66 y.o. F) Treating RN: Cornell Barman Primary Care Provider: [name] Other Clinician: Massie Kluver Referring Provider: [name] Treating Provider/Extender: Skipper Cliche in Treatment: 27 Subjective Chief Complaint Information obtained from Patient Bilateral lower extremity phlebolymphedema and recurrent calf ulcerations. New sacral wound as of 10/29/21 History of Present Illness (HPI) 66yo w/ h/o BLE phlebolymphedema, type 2 DM (unknown hemoglobin A1c), and obesity. No PVD. h/o chronic, recurrent BLE calf ulcers. Treated with 4 layer compression. Infrequently uses lymphedema pump. Bilateral lower extremity ulcerations healed as of June 2016. Fitted for custom compression stockings but did not receive them. Patient could not travel for lymphedema consult. She developed recurrent bilateral lower extremity ulcerations in August 2016. She is able to heal these quite quickly with 4 layer elastic compression bandages. However, she is not very compliant using her juxtalite compression garments and tends to develop recurrent ulcerations while using juxtalite. Unna boots  were applied this past week. She tolerated this well but performs minimal ambulation. She is scheduled to undergo total hip replacement tomorrow with Dr. Rudene Christians. She is unsure if he is aware of her ulcerations. She is otherwise without complaints. No significant pain. No fever or chills. Moderate clear drainage. Not on any  antibiotics. 06/04/2015 -- she did have her hip surgery and has now been in rehabilitation and they're controlling her fluid intake and diuretics to the extent that she has lost 13 pounds of weight and her lower extremity circumference is decreased by 7 cm. 06/18/2015 -- she is still in the rehabilitation facility and continues to be looked after well and has lost a total of 15 pounds and her diabetes is under much better control. Readmission: 06/03/2021 upon evaluation today patient actually appears to be doing somewhat poorly in regard to a wound on the right posterior lower leg. Patient does have a history of previously having been seen here in the clinic but this predates my time here back in 2017. Subsequently unfortunately right now she is having a significant issue with bleeding in the posterior of her right calf this appears to be a deep tissue injury as developed into an ulceration. She does have a history significant for lymphedema, diabetes mellitus type 2, diabetic neuropathy, hypertension, hypothyroidism, and obesity. She is currently in a skilled nursing facility. The dressing that she had in place during evaluation today was Xeroform followed by significant amount of gauze and this was severely stuck. Upon removal the patient was having significant bleeding she also had significant work done in the hospital in regard to her blood flow and Dr. Lucky Cowboy does look like he has her on 2 blood thinners Plavix and Lovenox. This is probably part of the reason why she is bleeding so significantly. With that being said I do believe that based on what we are seeing currently this is probably can to be something that were getting have to try to get stopped with more than just pressure to the region currently. I think surgery foam is probably can to be necessary. If we can get the stop of the Surgifoam she is probably can have to go to the hospital to try to get this bleeding stopped. 12/30; this is a  patient who is currently in a skilled facility. She has wounds on her bilateral calfs likely secondary to severe chronic venous insufficiency and lymphedema the care of these wounds is complicated by the fact that she is on Lovenox and Plavix and bleeds very frequently with even minimal trauma to these wounds. It would appear that she had a Vaseline gauze on the surface of these in the process of undressing them she bled freely from especially the right but also the left. I had to go in and use silver nitrate. The skin on her legs is significant for chronic venous stasis and lymphedema. Very dry flaking skin 07/16/2021; I actually was a patent person who saw this woman 3 weeks ago. She is a nursing facility has chronic venous wounds and bilateral lower extremity wounds. We use Xeroform under compression. The time she was on Lovenox and Plavix I thought I had an excellent reason for these wounds to bleed so freely. Since then the Lovenox has been stopped and should be well out of her system but she is still on Plavix. She still has very friable bleeding wounds on the left lateral left Posterior and right Posterior lower extremity.These believe very freely when we take  off the dressing Also she has a left heel wound which when we saw her last time was a surgical wound however I gather we have been asked to follow this as well. I am not exactly sure at this time what her surgery was but she comes in with a wound VAC on this and they are apparently changing it at the nursing home. 08/27/2021 upon evaluation patient appears to be doing better in regard to her wound on the legs as well as the heel. Fortunately at this point I do not see any need for a wound VAC which is good news overall I think that the Xeroform is probably a good option that is what is being utilized here as well. Fortunately I do not see any signs of active infection locally nor systemically which is great news and overall I think that the  patient is making excellent progress. The area on her right gluteal area actually appears to be doing quite well and I am pleased with that. I do believe the silver alginate dressing here could be of benefit for her. 09/17/2021 upon evaluation patient appears to be doing actually better in regard to her wounds on the legs. Fortunately she does not seem to be showing any signs of worsening which is great news and overall very pleased with where we stand. I do believe that she is tolerating the dressing changes without complication which is great news. [name], [name] ([id number]) 10-08-2021 upon evaluation today patient unfortunately continues to have significant issues with her legs. Unfortunately the biggest issue that we see Currently is that she is still having a lot of bleeding from her legs that she is also not able to move well due to her knee pain her left knee is quite significant as far as the discomfort is concerned and the right knee is getting equally as bad. Fortunately I do not see any evidence of active infection locally or systemically which is great news. Nonetheless I do believe that her heels are looking much better there is a tiny area open on the left heel the right is doing quite well. In regard to the gluteal/sacral region this appears to be healed. 10-29-2021 upon evaluation today patient unfortunately is appearing to do much worse compared to normal. She has more breakdown in regard to the left lateral leg and again I think this is pressure related. She also has more breakdown in regard to the gluteal area and she is very concerned about this in particular which is completely understandable. With that being said I do believe that she would benefit from more aggressive and appropriate offloading I discussed with her that she needs to allow the facility to change her position every 2 hours and she is can have to find different positions other than just lying on her back. Also  discussed with her that obviously the facility needs to make sure to help her with repositioning every 2 hours I think both aspects of this are important. Otherwise her wound on the sacral area is going to continue to get worse and the leg will continue to get worse. 11-26-2021 upon evaluation today patient appears to be doing well with regard to her wounds all things considered. The gluteal areas are all healed which is great news. With that being said she is still having some issues here with her lower extremities. I do think that this is still something that we need to be very mindful of. Unfortunately the facility is still  not washing her legs she has a lot of caked on dry skin with Xeroform we will get a try to get some of that off today. With that being said he does have reiterated in the past the patient needs to have her legs washed regularly. 12-24-2021 upon evaluation patient's wounds are showing signs of improvement albeit slowly. Fortunately I do not see any signs of infection locally or systemically which is great news. Nonetheless I do believe that the patient is doing quite well from the standpoint of the overall appearance of her wounds which are better than where they have been even if not where we would like them to be. 02-03-2022 upon evaluation today patient appears to be doing about the same in regard to her wounds. There is maybe a little bit of improvement in general based on what I am seeing currently but not much to be honest. I do think that the patient is making some progress here when it comes to the overall appearance of her wounds. Objective Constitutional Well-nourished and well-hydrated in no acute distress. Vitals Time Taken: 12:47 PM, Height: 65 in, Weight: 245.4 lbs, BMI: 40.8, Temperature: 97.9 F, Pulse: 65 bpm, Respiratory Rate: 16 breaths/min, Blood Pressure: 105/68 mmHg. Respiratory normal breathing without difficulty. Psychiatric this patient is able to make  decisions and demonstrates good insight into disease process. Alert and Oriented x 3. pleasant and cooperative. General Notes: Upon inspection patient's wound bed actually showed signs again of improvement in general with regard to what I am seeing I do not see any evidence of worsening from the standpoint of the appearance of the wounds or just new areas of popup that are more blistered here and there. Overall I do think that that is going to be an ongoing issue to be honest. Integumentary (Hair, Skin) Wound #60 status is Open. Original cause of wound was Pressure Injury. The date acquired was: 05/17/2021. The wound has been in treatment 35 weeks. The wound is located on the Right,Circumferential Lower Leg. The wound measures 15cm length x 19.5cm width x 0.2cm depth; 229.729cm^2 area and 45.946cm^3 volume. There is no tunneling or undermining noted. There is a large amount of sanguinous drainage noted. There is small (1-33%) red granulation within the wound bed. There is a small (1-33%) amount of necrotic tissue within the wound bed including Adherent Slough. Wound #61 status is Open. Original cause of wound was Gradually Appeared. The date acquired was: 06/11/2021. The wound has been in treatment 31 weeks. The wound is located on the Left,Posterior Lower Leg. The wound measures 13.3cm length x 6cm width x 0.1cm depth; 62.675cm^2 area and 6.267cm^3 volume. There is Fat Layer (Subcutaneous Tissue) exposed. There is a large amount of serosanguineous drainage noted. There is medium (34-66%) red granulation within the wound bed. There is a small (1-33%) amount of necrotic tissue within the wound bed including Adherent Slough. Wound #62 status is Open. Original cause of wound was Gradually Appeared. The date acquired was: 06/11/2021. The wound has been in treatment 31 weeks. The wound is located on the Left,Lateral Lower Leg. The wound measures 14.8cm length x 4cm width x 0.5cm depth; 46.496cm^2 area  and 23.248cm^3 volume. There is Fat Layer (Subcutaneous Tissue) exposed. There is no tunneling or undermining noted. There is a large amount of sanguinous drainage noted. There is small (1-33%) red, pale granulation within the wound bed. There is a small (1-33%) amount of necrotic tissue within the wound bed including Adherent Slough. Wound #67 status is  Open. Original cause of wound was Pressure Injury. The date acquired was: 01/13/2022. The wound is located on the Right Calcaneus. The wound measures 2cm length x 1.5cm width x 0.1cm depth; 2.356cm^2 area and 0.236cm^3 volume. There is Fat Layer (Subcutaneous Tissue) exposed. There is a medium amount of serosanguineous drainage noted. There is a small (1-33%) amount of necrotic [name], [name] ([id number]) tissue within the wound bed including Eschar and Adherent Slough. Wound #68 status is Open. Original cause of wound was Shear/Friction. The date acquired was: 12/27/2021. The wound is located on the Left,Anterior Lower Leg. The wound measures 9.5cm length x 9.7cm width x 0.1cm depth; 72.374cm^2 area and 7.237cm^3 volume. There is Fat Layer (Subcutaneous Tissue) exposed. There is a medium amount of serosanguineous drainage noted. There is small (1-33%) red granulation within the wound bed. There is a small (1-33%) amount of necrotic tissue within the wound bed including Adherent Slough. Assessment Active Problems ICD-10 Lymphedema, not elsewhere classified Type 2 diabetes mellitus with other skin ulcer Type 2 diabetes mellitus with diabetic neuropathy, unspecified Pressure ulcer of other site, stage 3 Pressure-induced deep tissue damage of other site Pressure ulcer of sacral region, stage 3 Essential (primary) hypertension Other specified hypothyroidism Other obesity due to excess calories Plan Follow-up Appointments: Wound #60 Right,Circumferential Lower Leg: Return Appointment in 1 month Bathing/ Shower/ Hygiene: Wash wounds with  antibacterial soap and water. - WASH LEGS WITH SOAP and WATER before applying dressings. No tub bath. Edema Control - Lymphedema / Segmental Compressive Device / Other: Other: - Lymphedema-use ace wraps to help control swelling. Off-Loading: Turn and reposition every 2 hours - PLEASE MAKE SURE THIS IS COMPLETED EVERY TWO HOURS THANK YOU Other: - Put pillows under knees to keep pressure off of calves. Deep tissue injury on posterior calves. Turn on side every 1 hous to keep pressure off calves wear offloading boots Additional Orders / Instructions: Follow Nutritious Diet and Increase Protein Intake WOUND #60: - Lower Leg Wound Laterality: Right, Circumferential Cleanser: Normal Saline 3 x Per Week/30 Days Discharge Instructions: Wash your hands with soap and water. Remove old dressing, discard into plastic bag and place into trash. Cleanse the wound with Normal Saline prior to applying a clean dressing using gauze sponges, not tissues or cotton balls. Do not scrub or use excessive force. Pat dry using gauze sponges, not tissue or cotton balls. Primary Dressing: Xeroform 5x9-HBD (in/in) 3 x Per Week/30 Days Discharge Instructions: 1. Apply Xeroform 5x9-HBD (in/in) as directed Secondary Dressing: ABD Pad 5x9 (in/in) 3 x Per Week/30 Days Discharge Instructions: 2. Cover with ABD pad Secured With: ACE WRAP - 50M ACE Elastic Bandage With VELCRO Brand Closure, 4 (in) 3 x Per Week/30 Days Discharge Instructions: 4. Ace wrap to secure dressing in place and control swelling. Secured With: [employer] or Non-Sterile 6-ply 4.5x4 (yd/yd) 3 x Per Week/30 Days Discharge Instructions: 3. Apply Kerlix as directed WOUND #61: - Lower Leg Wound Laterality: Left, Posterior Cleanser: Normal Saline 3 x Per Week/30 Days Discharge Instructions: Wash your hands with soap and water. Remove old dressing, discard into plastic bag and place into trash. Cleanse the wound with Normal Saline prior to applying a  clean dressing using gauze sponges, not tissues or cotton balls. Do not scrub or use excessive force. Pat dry using gauze sponges, not tissue or cotton balls. Primary Dressing: Xeroform 5x9-HBD (in/in) 3 x Per Week/30 Days Discharge Instructions: 1. Apply Xeroform 5x9-HBD (in/in) as directed Secondary Dressing: ABD Pad 5x9 (in/in) 3  x Per Week/30 Days Discharge Instructions: 2. Cover with ABD pad Secured With: ACE WRAP - 29M ACE Elastic Bandage With VELCRO Brand Closure, 4 (in) 3 x Per Week/30 Days Discharge Instructions: 4. Ace wrap to secure dressing in place and control swelling. Secured With: [employer] or Non-Sterile 6-ply 4.5x4 (yd/yd) 3 x Per Week/30 Days Discharge Instructions: 3. Apply Kerlix as directed WOUND #62: - Lower Leg Wound Laterality: Left, Lateral Cleanser: Normal Saline 3 x Per Week/30 Days Discharge Instructions: Wash your hands with soap and water. Remove old dressing, discard into plastic bag and place into trash. Cleanse the wound with Normal Saline prior to applying a clean dressing using gauze sponges, not tissues or cotton balls. Do not scrub or use excessive force. Pat dry using gauze sponges, not tissue or cotton balls. [name], [name] ([id number]) Primary Dressing: Xeroform 5x9-HBD (in/in) 3 x Per Week/30 Days Discharge Instructions: 1. Apply Xeroform 5x9-HBD (in/in) as directed Secondary Dressing: ABD Pad 5x9 (in/in) 3 x Per Week/30 Days Discharge Instructions: 2. Cover with ABD pad Secured With: ACE WRAP - 29M ACE Elastic Bandage With VELCRO Brand Closure, 4 (in) 3 x Per Week/30 Days Discharge Instructions: 4. Ace wrap to secure dressing in place and control swelling. Secured With: [employer] or Non-Sterile 6-ply 4.5x4 (yd/yd) 3 x Per Week/30 Days Discharge Instructions: 3. Apply Kerlix as directed 1. I would recommend currently that we go ahead and continue with the wound care measures as before and the patient is in agreement with plan.  This includes the use of the Xeroform gauze dressing which I think is still doing quite well. 2. I am also can recommend that we have the patient continue with the ABD pads to cover followed by Ace wrap to secure in place along with roll gauze. 3. I am also going to suggest the patient should continue to monitor for any signs of worsening or infection. Office if anything changes she will let me know. We will see patient back for reevaluation in 3 weeks here in the clinic. If anything worsens or changes patient will contact our office for additional recommendations. Electronic Signature(s) Signed: 02/03/2022 2:25:06 PM By: Worthy Keeler PA-C Entered By: Worthy Keeler on 02/03/2022 14:25:05 [name] ([id number]) -------------------------------------------------------------------------------- SuperBill Details Patient Name: [name] Date of Service: 02/03/2022 Medical Record Number: [id number] Patient Account Number: [id number] Date of Birth/Sex: [birth year] (66 y.o. F) Treating RN: Cornell Barman Primary Care Provider: [name] Other Clinician: Massie Kluver Referring Provider: [name] Treating Provider/Extender: Skipper Cliche in Treatment: 35 Diagnosis Coding ICD-10 Codes Code Description I89.0 Lymphedema, not elsewhere classified E11.622 Type 2 diabetes mellitus with other skin ulcer E11.40 Type 2 diabetes mellitus with diabetic neuropathy, unspecified L89.893 Pressure ulcer of other site, stage 3 L89.896 Pressure-induced deep tissue damage of other site L89.153 Pressure ulcer of sacral region, stage 3 I10 Essential (primary) hypertension E03.8 Other specified hypothyroidism E66.09 Other obesity due to excess calories Facility Procedures CPT4 Code: [id number] Description: [id number] - WOUND CARE VISIT-LEV 5 EST PT Modifier: Quantity: 1 Physician Procedures CPT4 Code: [id number] Description: 99213 - WC PHYS LEVEL 3 - EST PT Modifier: Quantity: 1 CPT4  Code: Description: ICD-10 Diagnosis Description I89.0 Lymphedema, not elsewhere classified E11.622 Type 2 diabetes mellitus with other skin ulcer E11.40 Type 2 diabetes mellitus with diabetic neuropathy, unspecified L89.893 Pressure ulcer of other site, stage  3 Modifier: Quantity: Electronic Signature(s) Signed: 02/03/2022 2:25:25 PM By: Worthy Keeler PA-C Entered By: Melburn Hake,  Lana Flaim on 02/03/2022 14:25:25

## 2022-02-04 NOTE — Progress Notes (Signed)
[name], [name] ([id number]) Visit Report for 02/03/2022 Arrival Information Details Patient Name: [name], [name]. Date of Service: 02/03/2022 12:45 PM Medical Record Number: [id number] Patient Account Number: [id number] Date of Birth/Sex: [birth year] (66 y.o. F) Treating RN: Cornell Barman Primary Care Kayleana Waites: [name] Other Clinician: Massie Kluver Referring Colburn Asper: [name] Treating Taner Rzepka/Extender: Skipper Cliche in Treatment: 38 Visit Information History Since Last Visit All ordered tests and consults were completed: No Patient Arrived: Stretcher Added or deleted any medications: No Arrival Time: 12:37 Any new allergies or adverse reactions: No Transfer Assistance: Stretcher Had a fall or experienced change in No Patient Requires Transmission-Based No activities of daily living that may affect Precautions: risk of falls: Patient Has Alerts: Yes Hospitalized since last visit: No Patient Alerts: Patient on Blood Pain Present Now: Yes Thinner TYPE II Diabetic Plavix and Lovenox [place] Signature(s) Signed: 02/04/2022 4:28:06 PM By: Massie Kluver Entered By: Massie Kluver on 02/03/2022 12:45:51 [name] ([id number]) -------------------------------------------------------------------------------- Clinic Level of Care Assessment Details Patient Name: [name]. Date of Service: 02/03/2022 12:45 PM Medical Record Number: [id number] Patient Account Number: [id number] Date of Birth/Sex: 05-08-1956 (66 y.o. F) Treating RN: Cornell Barman Primary Care Cela Newcom: [name] Other Clinician: Massie Kluver Referring Adia Crammer: [name] Treating Arin Peral/Extender: Skipper Cliche in Treatment: 35 Clinic Level of Care Assessment Items TOOL 4 Quantity Score []  - Use when only an EandM is performed on FOLLOW-UP visit 0 ASSESSMENTS - Nursing Assessment / Reassessment X - Reassessment of Co-morbidities (includes  updates in patient status) 1 10 X- 1 5 Reassessment of Adherence to Treatment Plan ASSESSMENTS - Wound and Skin Assessment / Reassessment []  - Simple Wound Assessment / Reassessment - one wound 0 X- 5 5 Complex Wound Assessment / Reassessment - multiple wounds []  - 0 Dermatologic / Skin Assessment (not related to wound area) ASSESSMENTS - Focused Assessment []  - Circumferential Edema Measurements - multi extremities 0 []  - 0 Nutritional Assessment / Counseling / Intervention []  - 0 Lower Extremity Assessment (monofilament, tuning fork, pulses) []  - 0 Peripheral Arterial Disease Assessment (using hand held doppler) ASSESSMENTS - Ostomy and/or Continence Assessment and Care []  - Incontinence Assessment and Management 0 []  - 0 Ostomy Care Assessment and Management (repouching, etc.) PROCESS - Coordination of Care X - Simple Patient / Family Education for ongoing care 1 15 []  - 0 Complex (extensive) Patient / Family Education for ongoing care []  - 0 Staff obtains Programmer, systems, Records, Test Results / Process Orders []  - 0 Staff telephones HHA, Nursing Homes / Clarify orders / etc []  - 0 Routine Transfer to another Facility (non-emergent condition) []  - 0 Routine Hospital Admission (non-emergent condition) []  - 0 New Admissions / Biomedical engineer / Ordering NPWT, Apligraf, etc. []  - 0 Emergency Hospital Admission (emergent condition) X- 1 10 Simple Discharge Coordination []  - 0 Complex (extensive) Discharge Coordination PROCESS - Special Needs []  - Pediatric / Minor Patient Management 0 []  - 0 Isolation Patient Management []  - 0 Hearing / Language / Visual special needs []  - 0 Assessment of Community assistance (transportation, D/C planning, etc.) []  - 0 Additional assistance / Altered mentation []  - 0 Support Surface(s) Assessment (bed, cushion, seat, etc.) INTERVENTIONS - Wound Cleansing / Measurement [name], [name] ([id number]) []  - 0 Simple Wound  Cleansing - one wound X- 5 5 Complex Wound Cleansing - multiple wounds X- 1 5 Wound Imaging (photographs - any number of wounds) []  - 0 Wound Tracing (instead of photographs) []  -  0 Simple Wound Measurement - one wound X- 5 5 Complex Wound Measurement - multiple wounds INTERVENTIONS - Wound Dressings []  - Small Wound Dressing one or multiple wounds 0 []  - 0 Medium Wound Dressing one or multiple wounds X- 2 20 Large Wound Dressing one or multiple wounds []  - 0 Application of Medications - topical []  - 0 Application of Medications - injection INTERVENTIONS - Miscellaneous []  - External ear exam 0 []  - 0 Specimen Collection (cultures, biopsies, blood, body fluids, etc.) []  - 0 Specimen(s) / Culture(s) sent or taken to Lab for analysis []  - 0 Patient Transfer (multiple staff / Civil Service fast streamer / Similar devices) []  - 0 Simple Staple / Suture removal (25 or less) []  - 0 Complex Staple / Suture removal (26 or more) []  - 0 Hypo / Hyperglycemic Management (close monitor of Blood Glucose) []  - 0 Ankle / Brachial Index (ABI) - do not check if billed separately X- 1 5 Vital Signs Has the patient been seen at the hospital within the last three years: Yes Total Score: 165 Level Of Care: New/Established - Level 5 Electronic Signature(s) Signed: 02/04/2022 4:28:06 PM By: Massie Kluver Entered By: Massie Kluver on 02/03/2022 14:04:09 [name] ([id number]) -------------------------------------------------------------------------------- Encounter Discharge Information Details Patient Name: [name]. Date of Service: 02/03/2022 12:45 PM Medical Record Number: [id number] Patient Account Number: [id number] Date of Birth/Sex: [birth year] (66 y.o. F) Treating RN: Cornell Barman Primary Care Raequon Catanzaro: [name] Other Clinician: Massie Kluver Referring Emmauel Hallums: [name] Treating Deshannon Hinchliffe/Extender: Skipper Cliche in Treatment: 35 Encounter Discharge Information  Items Discharge Condition: Stable Ambulatory Status: Stretcher Discharge Destination: Skilled Nursing Facility Telephoned: Yes Orders Sent: Yes Transportation: Ambulance Accompanied By: self Schedule Follow-up Appointment: Yes Clinical Summary of Care: Electronic Signature(s) Signed: 02/04/2022 4:28:06 PM By: Massie Kluver Entered By: Massie Kluver on 02/03/2022 14:06:33 [name] ([id number]) -------------------------------------------------------------------------------- Lower Extremity Assessment Details Patient Name: [name]. Date of Service: 02/03/2022 12:45 PM Medical Record Number: [id number] Patient Account Number: [id number] Date of Birth/Sex: 13-Oct-1955 (66 y.o. F) Treating RN: Cornell Barman Primary Care Kayelee Herbig: [name] Other Clinician: Massie Kluver Referring Elicia Lui: [name] Treating Taray Normoyle/Extender: Skipper Cliche in Treatment: 35 Electronic Signature(s) Signed: 02/04/2022 9:12:30 AM By: Gretta Cool BSN, RN, CWS, Kim RN, BSN Signed: 02/04/2022 4:28:06 PM By: Massie Kluver Entered By: Massie Kluver on 02/03/2022 13:19:04 [name] ([id number]) -------------------------------------------------------------------------------- Multi Wound Chart Details Patient Name: [name]. Date of Service: 02/03/2022 12:45 PM Medical Record Number: [id number] Patient Account Number: [id number] Date of Birth/Sex: Nov 29, 1955 (66 y.o. F) Treating RN: Cornell Barman Primary Care Adan Baehr: [name] Other Clinician: Massie Kluver Referring Galvin Aversa: [name] Treating Taygen Acklin/Extender: Jeri Cos Weeks in Treatment: 35 Vital Signs Height(in): 65 Pulse(bpm): 65 Weight(lbs): 245.4 Blood Pressure(mmHg): 105/68 Body Mass Index(BMI): 40.8 Temperature(F): 97.9 Respiratory Rate(breaths/min): 16 Photos: Wound Location: Right, Circumferential Lower Leg Left, Posterior Lower Leg Left, Lateral Lower Leg Wounding Event: Pressure  Injury Gradually Appeared Gradually Appeared Primary Etiology: Pressure Ulcer Lymphedema Lymphedema Comorbid History: Lymphedema, Hypertension, Lymphedema, Hypertension, Lymphedema, Hypertension, Peripheral Arterial Disease, Peripheral Arterial Disease, Peripheral Arterial Disease, Peripheral Venous Disease, Type II Peripheral Venous Disease, Type II Peripheral Venous Disease, Type II Diabetes, End Stage Renal Disease, Diabetes, End Stage Renal Disease, Diabetes, End Stage Renal Disease, Osteoarthritis, Neuropathy Osteoarthritis, Neuropathy Osteoarthritis, Neuropathy Date Acquired: 05/17/2021 06/11/2021 06/11/2021 Weeks of Treatment: 35 31 31 Wound Status: Open Open Open Wound Recurrence: No No No Clustered Wound: No No No Measurements L x W x D (cm)  15x19.5x0.2 13.3x6x0.1 14.8x4x0.5 Area (cm) : 229.729 62.675 46.496 Volume (cm) : 45.946 6.267 23.248 % Reduction in Area: -40560.00% -97.00% -4872.80% % Reduction in Volume: -80507.00% -97.00% -24897.80% Classification: Category/Stage III Full Thickness With Exposed Full Thickness Without Exposed Support Structures Support Structures Exudate Amount: Large Large Large Exudate Type: Sanguinous Serosanguineous Sanguinous Exudate Color: red red, brown red Granulation Amount: Small (1-33%) Medium (34-66%) Small (1-33%) Granulation Quality: Red Red Red, Pale Necrotic Amount: Small (1-33%) Small (1-33%) Small (1-33%) Necrotic Tissue: Adherent [place] Adherent Slough Epithelialization: Small (1-33%) None None Wound Number: [id number] N/A Photos: N/A Wound Location: Right Calcaneus Left, Anterior Lower Leg N/A [name], [name] ([id number]) Wounding Event: Pressure Injury Shear/Friction N/A Primary Etiology: Pressure Ulcer Skin Tear N/A Comorbid History: Lymphedema, Hypertension, Lymphedema, Hypertension, N/A Peripheral Arterial Disease, Peripheral Arterial Disease, Peripheral Venous Disease, Type II Peripheral Venous Disease,  Type II Diabetes, End Stage Renal Disease, Diabetes, End Stage Renal Disease, Osteoarthritis, Neuropathy Osteoarthritis, Neuropathy Date Acquired: 01/13/2022 12/27/2021 N/A Weeks of Treatment: 0 0 N/A Wound Status: Open Open N/A Wound Recurrence: No No N/A Clustered Wound: No Yes N/A Measurements L x W x D (cm) 2x1.5x0.1 9.5x9.7x0.1 N/A Area (cm) : 2.356 72.374 N/A Volume (cm) : 0.236 7.237 N/A % Reduction in Area: 0.00% N/A N/A % Reduction in Volume: 0.00% N/A N/A Classification: Category/Stage II Full Thickness Without Exposed N/A Support Structures Exudate Amount: Medium Medium N/A Exudate Type: Serosanguineous Serosanguineous N/A Exudate Color: red, brown red, brown N/A Granulation Amount: N/A Small (1-33%) N/A Granulation Quality: N/A Red N/A Necrotic Amount: N/A Small (1-33%) N/A Necrotic Tissue: Eschar, Adherent [place] N/A Exposed Structures: Fat Layer (Subcutaneous Tissue): Fat Layer (Subcutaneous Tissue): N/A Yes Yes Fascia: No Fascia: No Tendon: No Tendon: No Muscle: No Muscle: No Joint: No Joint: No Bone: No Bone: No Epithelialization: N/A N/A N/A Treatment Notes Electronic Signature(s) Signed: 02/04/2022 4:28:06 PM By: Massie Kluver Entered By: Massie Kluver on 02/03/2022 14:01:42 [name] ([id number]) -------------------------------------------------------------------------------- [place] Details Patient Name: [name]. Date of Service: 02/03/2022 12:45 PM Medical Record Number: [id number] Patient Account Number: [id number] Date of Birth/Sex: [birth year] (66 y.o. F) Treating RN: Cornell Barman Primary Care Pakou Rainbow: [name] Other Clinician: Massie Kluver Referring Anaysha Andre: [name] Treating Azalie Harbeck/Extender: Skipper Cliche in Treatment: 35 Active Inactive Necrotic Tissue Nursing Diagnoses: Impaired tissue integrity related to necrotic/devitalized tissue Knowledge deficit related to  management of necrotic/devitalized tissue Goals: Necrotic/devitalized tissue will be minimized in the wound bed Date Initiated: 11/26/2021 Target Resolution Date: 11/26/2021 Goal Status: Active Patient/caregiver will verbalize understanding of reason and process for debridement of necrotic tissue Date Initiated: 11/26/2021 Target Resolution Date: 11/26/2021 Goal Status: Active Interventions: Assess patient pain level pre-, during and post procedure and prior to discharge Provide education on necrotic tissue and debridement process Treatment Activities: Apply topical anesthetic as ordered : 11/26/2021 Notes: Venous Leg Ulcer Nursing Diagnoses: Actual venous Insuffiency (use after diagnosis is confirmed) Knowledge deficit related to disease process and management Potential for venous Insuffiency (use before diagnosis confirmed) Goals: Patient will maintain optimal edema control Date Initiated: 11/26/2021 Target Resolution Date: 11/26/2021 Goal Status: Active Interventions: Assess peripheral edema status every visit. Notes: Wound/Skin Impairment Nursing Diagnoses: Impaired tissue integrity Knowledge deficit related to smoking impact on wound healing Knowledge deficit related to ulceration/compromised skin integrity Goals: Patient/caregiver will verbalize understanding of skin care regimen Date Initiated: 06/03/2021 Target Resolution Date: 06/03/2021 Goal Status: Active Interventions: Assess ulceration(s) every visit [name], [name] ([id number]) Provide education on  ulcer and skin care Treatment Activities: Skin care regimen initiated : 06/03/2021 Topical wound management initiated : 06/03/2021 Notes: Electronic Signature(s) Signed: 02/04/2022 9:12:30 AM By: Gretta Cool, BSN, RN, CWS, Kim RN, BSN Signed: 02/04/2022 4:28:06 PM By: Massie Kluver Entered By: Massie Kluver on 02/03/2022 14:01:32 [name]  ([id number]) -------------------------------------------------------------------------------- Pain Assessment Details Patient Name: [name]. Date of Service: 02/03/2022 12:45 PM Medical Record Number: [id number] Patient Account Number: [id number] Date of Birth/Sex: [birth year] (66 y.o. F) Treating RN: Cornell Barman Primary Care Caydin Yeatts: [name] Other Clinician: Massie Kluver Referring Timarion Agcaoili: [name] Treating Vernice Mannina/Extender: Skipper Cliche in Treatment: 35 Active Problems Location of Pain Severity and Description of Pain Patient Has Paino Yes Site Locations Pain Location: Pain in Ulcers Duration of the Pain. Constant / Intermittento Intermittent Rate the pain. Current Pain Level: 10 Character of Pain Describe the Pain: Sharp Pain Management and Medication Current Pain Management: Medication: Yes Rest: Yes Notes right heel painful when touched Electronic Signature(s) Signed: 02/04/2022 9:12:30 AM By: Gretta Cool, BSN, RN, CWS, Kim RN, BSN Signed: 02/04/2022 4:28:06 PM By: Massie Kluver Entered By: Massie Kluver on 02/03/2022 12:51:22 [name] ([id number]) -------------------------------------------------------------------------------- Patient/Caregiver Education Details Patient Name: [name]. Date of Service: 02/03/2022 12:45 PM Medical Record Number: [id number] Patient Account Number: [id number] Date of Birth/Gender: 1956-02-16 (66 y.o. F) Treating RN: Cornell Barman Primary Care Physician: [name] Other Clinician: Massie Kluver Referring Physician: [name] Treating Physician/Extender: Skipper Cliche in Treatment: 94 Education Assessment Education Provided To: Patient Education Topics Provided Wound/Skin Impairment: Handouts: Other: continue wound care as directed Electronic Signature(s) Signed: 02/04/2022 4:28:06 PM By: Massie Kluver Entered By: Massie Kluver on 02/03/2022 14:04:34 [name]  ([id number]) -------------------------------------------------------------------------------- Wound Assessment Details Patient Name: [name] Date of Service: 02/03/2022 12:45 PM Medical Record Number: [id number] Patient Account Number: [id number] Date of Birth/Sex: [birth year] (66 y.o. F) Treating RN: Cornell Barman Primary Care Mattilynn Forrer: [name] Other Clinician: Massie Kluver Referring Orhan Mayorga: [name] Treating Aliviana Burdell/Extender: Jeri Cos Weeks in Treatment: 35 Wound Status Wound Number: 60 Primary Pressure Ulcer Etiology: Wound Location: Right, Circumferential Lower Leg Wound Open Wounding Event: Pressure Injury Status: Date Acquired: 05/17/2021 Comorbid Lymphedema, Hypertension, Peripheral Arterial Disease, Weeks Of Treatment: 35 History: Peripheral Venous Disease, Type II Diabetes, End Stage Clustered Wound: No Renal Disease, Osteoarthritis, Neuropathy Photos Wound Measurements Length: (cm) 15 Width: (cm) 19.5 Depth: (cm) 0.2 Area: (cm) 229.729 Volume: (cm) 45.946 % Reduction in Area: -40560% % Reduction in Volume: -80507% Epithelialization: Small (1-33%) Tunneling: No Undermining: No Wound Description Classification: Category/Stage III Exudate Amount: Large Exudate Type: Sanguinous Exudate Color: red Foul Odor After Cleansing: No Slough/Fibrino Yes Wound Bed Granulation Amount: Small (1-33%) Granulation Quality: Red Necrotic Amount: Small (1-33%) Necrotic Quality: Adherent Slough Treatment Notes Wound #60 (Lower Leg) Wound Laterality: Right, Circumferential Cleanser Normal Saline Discharge Instruction: Wash your hands with soap and water. Remove old dressing, discard into plastic bag and place into trash. Cleanse the wound with Normal Saline prior to applying a clean dressing using gauze sponges, not tissues or cotton balls. Do not scrub or use excessive force. Pat dry using gauze sponges, not tissue or cotton balls. Peri-Wound  Care Topical Bahena, Alva K. ([id number]) Primary Dressing Xeroform 5x9-HBD (in/in) Discharge Instruction: 1. Apply Xeroform 5x9-HBD (in/in) as directed Secondary Dressing ABD Pad 5x9 (in/in) Discharge Instruction: 2. Cover with ABD pad Secured With ACE WRAP - 62M ACE Elastic Bandage With VELCRO Brand Closure, 4 (in) Discharge Instruction: 4. Ace wrap to secure dressing  in place and control swelling. Kerlix Roll Sterile or Non-Sterile 6-ply 4.5x4 (yd/yd) Discharge Instruction: 3. Apply Kerlix as directed Compression Wrap Compression Stockings Add-Ons Electronic Signature(s) Signed: 02/04/2022 9:12:30 AM By: Gretta Cool, BSN, RN, CWS, Kim RN, BSN Signed: 02/04/2022 4:28:06 PM By: Massie Kluver Entered By: Massie Kluver on 02/03/2022 13:53:48 [name] ([id number]) -------------------------------------------------------------------------------- Wound Assessment Details Patient Name: [name]. Date of Service: 02/03/2022 12:45 PM Medical Record Number: [id number] Patient Account Number: [id number] Date of Birth/Sex: 1955-08-26 (66 y.o. F) Treating RN: Cornell Barman Primary Care Teren Zurcher: [name] Other Clinician: Massie Kluver Referring Kengo Sturges: [name] Treating Laylana Gerwig/Extender: Jeri Cos Weeks in Treatment: 35 Wound Status Wound Number: 61 Primary Lymphedema Etiology: Wound Location: Left, Posterior Lower Leg Wound Open Wounding Event: Gradually Appeared Status: Date Acquired: 06/11/2021 Comorbid Lymphedema, Hypertension, Peripheral Arterial Disease, Weeks Of Treatment: 31 History: Peripheral Venous Disease, Type II Diabetes, End Stage Clustered Wound: No Renal Disease, Osteoarthritis, Neuropathy Photos Wound Measurements Length: (cm) 13.3 Width: (cm) 6 Depth: (cm) 0.1 Area: (cm) 62.675 Volume: (cm) 6.267 % Reduction in Area: -97% % Reduction in Volume: -97% Epithelialization: None Wound Description Classification: Full Thickness With  Exposed Support Structures Exudate Amount: Large Exudate Type: Serosanguineous Exudate Color: red, brown Foul Odor After Cleansing: No Slough/Fibrino Yes Wound Bed Granulation Amount: Medium (34-66%) Exposed Structure Granulation Quality: Red Fat Layer (Subcutaneous Tissue) Exposed: Yes Necrotic Amount: Small (1-33%) Necrotic Quality: Adherent Slough Treatment Notes Wound #61 (Lower Leg) Wound Laterality: Left, Posterior Cleanser Normal Saline Discharge Instruction: Wash your hands with soap and water. Remove old dressing, discard into plastic bag and place into trash. Cleanse the wound with Normal Saline prior to applying a clean dressing using gauze sponges, not tissues or cotton balls. Do not scrub or use excessive force. Pat dry using gauze sponges, not tissue or cotton balls. Peri-Wound Care Topical Kuhner, Shane K. ([id number]) Primary Dressing Xeroform 5x9-HBD (in/in) Discharge Instruction: 1. Apply Xeroform 5x9-HBD (in/in) as directed Secondary Dressing ABD Pad 5x9 (in/in) Discharge Instruction: 2. Cover with ABD pad Secured With ACE WRAP - 85M ACE Elastic Bandage With VELCRO Brand Closure, 4 (in) Discharge Instruction: 4. Ace wrap to secure dressing in place and control swelling. Kerlix Roll Sterile or Non-Sterile 6-ply 4.5x4 (yd/yd) Discharge Instruction: 3. Apply Kerlix as directed Compression Wrap Compression Stockings Add-Ons Electronic Signature(s) Signed: 02/04/2022 9:12:30 AM By: Gretta Cool, BSN, RN, CWS, Kim RN, BSN Signed: 02/04/2022 4:28:06 PM By: Massie Kluver Entered By: Massie Kluver on 02/03/2022 13:55:02 [name] ([id number]) -------------------------------------------------------------------------------- Wound Assessment Details Patient Name: [name]. Date of Service: 02/03/2022 12:45 PM Medical Record Number: [id number] Patient Account Number: [id number] Date of Birth/Sex: 06-Jun-1956 (66 y.o. F) Treating RN: Cornell Barman Primary  Care Lyndel Dancel: [name] Other Clinician: Massie Kluver Referring Karolyne Timmons: [name] Treating Senai Ramnath/Extender: Jeri Cos Weeks in Treatment: 35 Wound Status Wound Number: 62 Primary Lymphedema Etiology: Wound Location: Left, Lateral Lower Leg Wound Open Wounding Event: Gradually Appeared Status: Date Acquired: 06/11/2021 Comorbid Lymphedema, Hypertension, Peripheral Arterial Disease, Weeks Of Treatment: 31 History: Peripheral Venous Disease, Type II Diabetes, End Stage Clustered Wound: No Renal Disease, Osteoarthritis, Neuropathy Photos Wound Measurements Length: (cm) 14.8 Width: (cm) 4 Depth: (cm) 0.5 Area: (cm) 46.496 Volume: (cm) 23.248 % Reduction in Area: -4872.8% % Reduction in Volume: -24897.8% Epithelialization: None Tunneling: No Undermining: No Wound Description Classification: Full Thickness Without Exposed Support Structu Exudate Amount: Large Exudate Type: Sanguinous Exudate Color: red res Wound Bed Granulation Amount: Small (1-33%) Exposed Structure Granulation Quality: Red, Pale Fat  Layer (Subcutaneous Tissue) Exposed: Yes Necrotic Amount: Small (1-33%) Necrotic Quality: Adherent Slough Treatment Notes Wound #62 (Lower Leg) Wound Laterality: Left, Lateral Cleanser Normal Saline Discharge Instruction: Wash your hands with soap and water. Remove old dressing, discard into plastic bag and place into trash. Cleanse the wound with Normal Saline prior to applying a clean dressing using gauze sponges, not tissues or cotton balls. Do not scrub or use excessive force. Pat dry using gauze sponges, not tissue or cotton balls. Peri-Wound Care Topical Renfrow, Elika K. ([id number]) Primary Dressing Xeroform 5x9-HBD (in/in) Discharge Instruction: 1. Apply Xeroform 5x9-HBD (in/in) as directed Secondary Dressing ABD Pad 5x9 (in/in) Discharge Instruction: 2. Cover with ABD pad Secured With ACE WRAP - 69M ACE Elastic Bandage With VELCRO Brand  Closure, 4 (in) Discharge Instruction: 4. Ace wrap to secure dressing in place and control swelling. Kerlix Roll Sterile or Non-Sterile 6-ply 4.5x4 (yd/yd) Discharge Instruction: 3. Apply Kerlix as directed Compression Wrap Compression Stockings Add-Ons Electronic Signature(s) Signed: 02/04/2022 9:12:30 AM By: Gretta Cool, BSN, RN, CWS, Kim RN, BSN Signed: 02/04/2022 4:28:06 PM By: Massie Kluver Entered By: Massie Kluver on 02/03/2022 13:56:39 [name] ([id number]) -------------------------------------------------------------------------------- Wound Assessment Details Patient Name: [name]. Date of Service: 02/03/2022 12:45 PM Medical Record Number: [id number] Patient Account Number: [id number] Date of Birth/Sex: 26-May-1956 (66 y.o. F) Treating RN: Cornell Barman Primary Care Syliva Mee: [name] Other Clinician: Massie Kluver Referring Azam Gervasi: [name] Treating Leylah Tarnow/Extender: Jeri Cos Weeks in Treatment: 35 Wound Status Wound Number: 67 Primary Pressure Ulcer Etiology: Wound Location: Right Calcaneus Wound Open Wounding Event: Pressure Injury Status: Date Acquired: 01/13/2022 Comorbid Lymphedema, Hypertension, Peripheral Arterial Disease, Weeks Of Treatment: 0 History: Peripheral Venous Disease, Type II Diabetes, End Stage Clustered Wound: No Renal Disease, Osteoarthritis, Neuropathy Photos Wound Measurements Length: (cm) 2 Width: (cm) 1.5 Depth: (cm) 0.1 Area: (cm) 2.356 Volume: (cm) 0.236 % Reduction in Area: 0% % Reduction in Volume: 0% Wound Description Classification: Category/Stage II Exudate Amount: Medium Exudate Type: Serosanguineous Exudate Color: red, brown Foul Odor After Cleansing: No Slough/Fibrino No Wound Bed Necrotic Amount: Small (1-33%) Exposed Structure Necrotic Quality: Eschar, Adherent Slough Fascia Exposed: No Fat Layer (Subcutaneous Tissue) Exposed: Yes Tendon Exposed: No Muscle Exposed: No Joint Exposed:  No Bone Exposed: No Treatment Notes Wound #67 (Calcaneus) Wound Laterality: Right Cleanser Normal Saline Discharge Instruction: Wash your hands with soap and water. Remove old dressing, discard into plastic bag and place into trash. Cleanse the wound with Normal Saline prior to applying a clean dressing using gauze sponges, not tissues or cotton balls. Do not scrub or use excessive force. Pat dry using gauze sponges, not tissue or cotton balls. [name], [name] ([id number]) Soap and Water Discharge Instruction: Gently cleanse wound with antibacterial soap, rinse and pat dry prior to dressing wounds Peri-Wound Care Topical Primary Dressing Xeroform 5x9-HBD (in/in) Discharge Instruction: Apply Xeroform 5x9-HBD (in/in) as directed Secondary Dressing ABD Pad 5x9 (in/in) Discharge Instruction: Cover with ABD pad Secured With Medipore Tape - 69M Medipore H Soft Cloth Surgical Tape, 2x2 (in/yd) Kerlix Roll Sterile or Non-Sterile 6-ply 4.5x4 (yd/yd) Discharge Instruction: Apply Kerlix as directed Compression Wrap Compression Stockings Add-Ons Electronic Signature(s) Signed: 02/04/2022 9:12:30 AM By: Gretta Cool, BSN, RN, CWS, Kim RN, BSN Signed: 02/04/2022 4:28:06 PM By: Massie Kluver Entered By: Massie Kluver on 02/03/2022 13:57:08 [name], [name] ([id number]) -------------------------------------------------------------------------------- Wound Assessment Details Patient Name: [name]. Date of Service: 02/03/2022 12:45 PM Medical Record Number: [id number] Patient Account Number: [id number] Date of Birth/Sex: [birth year] (66 y.o.  F) Treating RN: Cornell Barman Primary Care Haru Shaff: [name] Other Clinician: Massie Kluver Referring Eretria Manternach: [name] Treating Jozsef Wescoat/Extender: Jeri Cos Weeks in Treatment: 35 Wound Status Wound Number: 68 Primary Skin Tear Etiology: Wound Location: Left, Anterior Lower Leg Wound Open Wounding Event:  Shear/Friction Status: Date Acquired: 12/27/2021 Comorbid Lymphedema, Hypertension, Peripheral Arterial Disease, Weeks Of Treatment: 0 History: Peripheral Venous Disease, Type II Diabetes, End Stage Clustered Wound: Yes Renal Disease, Osteoarthritis, Neuropathy Photos Wound Measurements Length: (cm) Width: (cm) Depth: (cm) Area: (cm) Volume: (cm) 9.5 % Reduction in Area: 9.7 % Reduction in Volume: 0.1 72.374 7.237 Wound Description Classification: Full Thickness Without Exposed Support Structures Exudate Amount: Medium Exudate Type: Serosanguineous Exudate Color: red, brown Foul Odor After Cleansing: No Slough/Fibrino Yes Wound Bed Granulation Amount: Small (1-33%) Exposed Structure Granulation Quality: Red Fascia Exposed: No Necrotic Amount: Small (1-33%) Fat Layer (Subcutaneous Tissue) Exposed: Yes Necrotic Quality: Adherent Slough Tendon Exposed: No Muscle Exposed: No Joint Exposed: No Bone Exposed: No Treatment Notes Wound #68 (Lower Leg) Wound Laterality: Left, Anterior Cleanser Normal Saline Discharge Instruction: Wash your hands with soap and water. Remove old dressing, discard into plastic bag and place into trash. Cleanse the wound with Normal Saline prior to applying a clean dressing using gauze sponges, not tissues or cotton balls. Do not scrub or use excessive force. Pat dry using gauze sponges, not tissue or cotton balls. [name], [name] ([id number]) Soap and Water Discharge Instruction: Gently cleanse wound with antibacterial soap, rinse and pat dry prior to dressing wounds Peri-Wound Care Topical Primary Dressing Xeroform 5x9-HBD (in/in) Discharge Instruction: Apply Xeroform 5x9-HBD (in/in) as directed Secondary Dressing ABD Pad 5x9 (in/in) Discharge Instruction: Cover with ABD pad Secured With Weed H Soft Cloth Surgical Tape, 2x2 (in/yd) Kerlix Roll Sterile or Non-Sterile 6-ply 4.5x4 (yd/yd) Discharge Instruction:  Apply Kerlix as directed Compression Wrap Compression Stockings Add-Ons Electronic Signature(s) Signed: 02/04/2022 9:12:30 AM By: Gretta Cool, BSN, RN, CWS, Kim RN, BSN Signed: 02/04/2022 4:28:06 PM By: Massie Kluver Entered By: Massie Kluver on 02/03/2022 13:59:36 [name], [name] ([id number]) -------------------------------------------------------------------------------- Vitals Details Patient Name: [name]. Date of Service: 02/03/2022 12:45 PM Medical Record Number: [id number] Patient Account Number: [id number] Date of Birth/Sex: [birth year] (66 y.o. F) Treating RN: Cornell Barman Primary Care Sayward Horvath: [name] Other Clinician: Massie Kluver Referring Ferris Tally: [name] Treating Tumeka Chimenti/Extender: Skipper Cliche in Treatment: 35 Vital Signs Time Taken: 12:47 Temperature (F): 97.9 Height (in): 65 Pulse (bpm): 65 Weight (lbs): 245.4 Respiratory Rate (breaths/min): 16 Body Mass Index (BMI): 40.8 Blood Pressure (mmHg): 105/68 Reference Range: 80 - 120 mg / dl Electronic Signature(s) Signed: 02/04/2022 4:28:06 PM By: Massie Kluver Entered By: Massie Kluver on 02/03/2022 12:51:16

## 2022-02-08 ENCOUNTER — Other Ambulatory Visit: Payer: Self-pay | Admitting: Gastroenterology

## 2022-02-08 DIAGNOSIS — R29898 Other symptoms and signs involving the musculoskeletal system: Secondary | ICD-10-CM | POA: Diagnosis not present

## 2022-02-08 DIAGNOSIS — R000 Tachycardia, unspecified: Secondary | ICD-10-CM | POA: Diagnosis not present

## 2022-02-08 DIAGNOSIS — Z743 Need for continuous supervision: Secondary | ICD-10-CM | POA: Diagnosis not present

## 2022-02-08 DIAGNOSIS — I10 Essential (primary) hypertension: Secondary | ICD-10-CM | POA: Diagnosis not present

## 2022-02-08 DIAGNOSIS — K5909 Other constipation: Secondary | ICD-10-CM | POA: Diagnosis not present

## 2022-02-08 DIAGNOSIS — Z7401 Bed confinement status: Secondary | ICD-10-CM | POA: Diagnosis not present

## 2022-02-08 DIAGNOSIS — R1314 Dysphagia, pharyngoesophageal phase: Secondary | ICD-10-CM

## 2022-02-09 DIAGNOSIS — F411 Generalized anxiety disorder: Secondary | ICD-10-CM | POA: Diagnosis not present

## 2022-02-09 DIAGNOSIS — F330 Major depressive disorder, recurrent, mild: Secondary | ICD-10-CM | POA: Diagnosis not present

## 2022-02-09 DIAGNOSIS — R69 Illness, unspecified: Secondary | ICD-10-CM | POA: Diagnosis not present

## 2022-02-10 DIAGNOSIS — R1310 Dysphagia, unspecified: Secondary | ICD-10-CM | POA: Diagnosis not present

## 2022-02-10 DIAGNOSIS — I10 Essential (primary) hypertension: Secondary | ICD-10-CM | POA: Diagnosis not present

## 2022-02-10 DIAGNOSIS — Z79899 Other long term (current) drug therapy: Secondary | ICD-10-CM | POA: Diagnosis not present

## 2022-02-10 DIAGNOSIS — G8929 Other chronic pain: Secondary | ICD-10-CM | POA: Diagnosis not present

## 2022-02-10 DIAGNOSIS — E119 Type 2 diabetes mellitus without complications: Secondary | ICD-10-CM | POA: Diagnosis not present

## 2022-02-10 DIAGNOSIS — K5900 Constipation, unspecified: Secondary | ICD-10-CM | POA: Diagnosis not present

## 2022-02-10 DIAGNOSIS — I509 Heart failure, unspecified: Secondary | ICD-10-CM | POA: Diagnosis not present

## 2022-02-10 DIAGNOSIS — E039 Hypothyroidism, unspecified: Secondary | ICD-10-CM | POA: Diagnosis not present

## 2022-02-15 DIAGNOSIS — E039 Hypothyroidism, unspecified: Secondary | ICD-10-CM | POA: Diagnosis not present

## 2022-02-15 DIAGNOSIS — E119 Type 2 diabetes mellitus without complications: Secondary | ICD-10-CM | POA: Diagnosis not present

## 2022-02-15 DIAGNOSIS — K5900 Constipation, unspecified: Secondary | ICD-10-CM | POA: Diagnosis not present

## 2022-02-15 DIAGNOSIS — Z79899 Other long term (current) drug therapy: Secondary | ICD-10-CM | POA: Diagnosis not present

## 2022-02-15 DIAGNOSIS — R69 Illness, unspecified: Secondary | ICD-10-CM | POA: Diagnosis not present

## 2022-02-15 DIAGNOSIS — I10 Essential (primary) hypertension: Secondary | ICD-10-CM | POA: Diagnosis not present

## 2022-02-15 DIAGNOSIS — I509 Heart failure, unspecified: Secondary | ICD-10-CM | POA: Diagnosis not present

## 2022-02-15 DIAGNOSIS — I890 Lymphedema, not elsewhere classified: Secondary | ICD-10-CM | POA: Diagnosis not present

## 2022-02-16 DIAGNOSIS — H25813 Combined forms of age-related cataract, bilateral: Secondary | ICD-10-CM | POA: Diagnosis not present

## 2022-02-16 DIAGNOSIS — E119 Type 2 diabetes mellitus without complications: Secondary | ICD-10-CM | POA: Diagnosis not present

## 2022-02-16 LAB — HM DIABETES FOOT EXAM

## 2022-02-16 LAB — HM DIABETES EYE EXAM

## 2022-02-17 ENCOUNTER — Other Ambulatory Visit (INDEPENDENT_AMBULATORY_CARE_PROVIDER_SITE_OTHER): Payer: Self-pay | Admitting: Nurse Practitioner

## 2022-02-17 DIAGNOSIS — Z9889 Other specified postprocedural states: Secondary | ICD-10-CM

## 2022-02-21 ENCOUNTER — Encounter (INDEPENDENT_AMBULATORY_CARE_PROVIDER_SITE_OTHER): Payer: Medicare HMO

## 2022-02-21 ENCOUNTER — Ambulatory Visit (INDEPENDENT_AMBULATORY_CARE_PROVIDER_SITE_OTHER): Payer: Medicare HMO | Admitting: Nurse Practitioner

## 2022-02-23 DIAGNOSIS — F330 Major depressive disorder, recurrent, mild: Secondary | ICD-10-CM | POA: Diagnosis not present

## 2022-02-23 DIAGNOSIS — F411 Generalized anxiety disorder: Secondary | ICD-10-CM | POA: Diagnosis not present

## 2022-02-23 DIAGNOSIS — R69 Illness, unspecified: Secondary | ICD-10-CM | POA: Diagnosis not present

## 2022-03-01 DIAGNOSIS — G8929 Other chronic pain: Secondary | ICD-10-CM | POA: Diagnosis not present

## 2022-03-03 ENCOUNTER — Ambulatory Visit: Payer: Medicare HMO | Admitting: Physician Assistant

## 2022-03-04 DIAGNOSIS — L89000 Pressure ulcer of unspecified elbow, unstageable: Secondary | ICD-10-CM | POA: Diagnosis not present

## 2022-03-09 DIAGNOSIS — F411 Generalized anxiety disorder: Secondary | ICD-10-CM | POA: Diagnosis not present

## 2022-03-09 DIAGNOSIS — R69 Illness, unspecified: Secondary | ICD-10-CM | POA: Diagnosis not present

## 2022-03-09 DIAGNOSIS — F330 Major depressive disorder, recurrent, mild: Secondary | ICD-10-CM | POA: Diagnosis not present

## 2022-03-18 ENCOUNTER — Ambulatory Visit: Payer: Medicare HMO

## 2022-03-18 DIAGNOSIS — Z79899 Other long term (current) drug therapy: Secondary | ICD-10-CM | POA: Diagnosis not present

## 2022-03-18 DIAGNOSIS — E119 Type 2 diabetes mellitus without complications: Secondary | ICD-10-CM | POA: Diagnosis not present

## 2022-03-18 DIAGNOSIS — G8929 Other chronic pain: Secondary | ICD-10-CM | POA: Diagnosis not present

## 2022-03-18 DIAGNOSIS — K5900 Constipation, unspecified: Secondary | ICD-10-CM | POA: Diagnosis not present

## 2022-03-18 DIAGNOSIS — I10 Essential (primary) hypertension: Secondary | ICD-10-CM | POA: Diagnosis not present

## 2022-03-18 DIAGNOSIS — R69 Illness, unspecified: Secondary | ICD-10-CM | POA: Diagnosis not present

## 2022-03-18 DIAGNOSIS — E039 Hypothyroidism, unspecified: Secondary | ICD-10-CM | POA: Diagnosis not present

## 2022-03-18 NOTE — Progress Notes (Signed)
Open in Error.

## 2022-03-24 DIAGNOSIS — R110 Nausea: Secondary | ICD-10-CM | POA: Diagnosis not present

## 2022-03-28 ENCOUNTER — Telehealth: Payer: Self-pay | Admitting: Family Medicine

## 2022-03-28 NOTE — Telephone Encounter (Signed)
Copied from [place] [id number]. Topic: Medicare AWV >> Mar 28, 2022 10:46 AM Jae Dire wrote: Reason for CRM:  No answer unable to leave a  message for patient to call back and schedule Medicare Annual Wellness Visit (AWV) in office.   If unable to come into the office for AWV,  please offer to do virtually or by telephone.  Last AWV:  03/10/2021  Please schedule at any time with [facility] Health Advisor.      30 minute appointment for Virtual or phone 45 minute appointment for in office or Initial virtual/phone  Any questions, please call me at [phone]

## 2022-03-29 DIAGNOSIS — Z91148 Patient's other noncompliance with medication regimen for other reason: Secondary | ICD-10-CM | POA: Diagnosis not present

## 2022-03-29 DIAGNOSIS — E559 Vitamin D deficiency, unspecified: Secondary | ICD-10-CM | POA: Diagnosis not present

## 2022-03-29 DIAGNOSIS — K5900 Constipation, unspecified: Secondary | ICD-10-CM | POA: Diagnosis not present

## 2022-03-29 DIAGNOSIS — E039 Hypothyroidism, unspecified: Secondary | ICD-10-CM | POA: Diagnosis not present

## 2022-03-30 DIAGNOSIS — R69 Illness, unspecified: Secondary | ICD-10-CM | POA: Diagnosis not present

## 2022-03-30 DIAGNOSIS — F411 Generalized anxiety disorder: Secondary | ICD-10-CM | POA: Diagnosis not present

## 2022-03-30 DIAGNOSIS — F330 Major depressive disorder, recurrent, mild: Secondary | ICD-10-CM | POA: Diagnosis not present

## 2022-04-01 DIAGNOSIS — E559 Vitamin D deficiency, unspecified: Secondary | ICD-10-CM | POA: Diagnosis not present

## 2022-04-01 DIAGNOSIS — I10 Essential (primary) hypertension: Secondary | ICD-10-CM | POA: Diagnosis not present

## 2022-04-01 DIAGNOSIS — R69 Illness, unspecified: Secondary | ICD-10-CM | POA: Diagnosis not present

## 2022-04-01 DIAGNOSIS — E039 Hypothyroidism, unspecified: Secondary | ICD-10-CM | POA: Diagnosis not present

## 2022-04-01 DIAGNOSIS — Z79899 Other long term (current) drug therapy: Secondary | ICD-10-CM | POA: Diagnosis not present

## 2022-04-01 DIAGNOSIS — E119 Type 2 diabetes mellitus without complications: Secondary | ICD-10-CM | POA: Diagnosis not present

## 2022-04-02 ENCOUNTER — Emergency Department: Payer: Medicare HMO

## 2022-04-02 ENCOUNTER — Emergency Department
Admission: EM | Admit: 2022-04-02 | Discharge: 2022-04-03 | Disposition: A | Discharge status: Home / Self Care | Payer: Medicare HMO | Attending: Emergency Medicine | Admitting: Emergency Medicine

## 2022-04-02 ENCOUNTER — Other Ambulatory Visit: Payer: Self-pay

## 2022-04-02 DIAGNOSIS — K5900 Constipation, unspecified: Secondary | ICD-10-CM | POA: Diagnosis not present

## 2022-04-02 DIAGNOSIS — Z743 Need for continuous supervision: Secondary | ICD-10-CM | POA: Diagnosis not present

## 2022-04-02 DIAGNOSIS — K8020 Calculus of gallbladder without cholecystitis without obstruction: Secondary | ICD-10-CM | POA: Diagnosis not present

## 2022-04-02 DIAGNOSIS — K5903 Drug induced constipation: Secondary | ICD-10-CM | POA: Diagnosis not present

## 2022-04-02 DIAGNOSIS — T402X5A Adverse effect of other opioids, initial encounter: Secondary | ICD-10-CM | POA: Diagnosis not present

## 2022-04-02 DIAGNOSIS — N281 Cyst of kidney, acquired: Secondary | ICD-10-CM | POA: Diagnosis not present

## 2022-04-02 DIAGNOSIS — R1032 Left lower quadrant pain: Secondary | ICD-10-CM | POA: Diagnosis present

## 2022-04-02 LAB — COMPREHENSIVE METABOLIC PANEL
ALT: 9 U/L (ref 0–44)
AST: 21 U/L (ref 15–41)
Albumin: 2.8 g/dL — ABNORMAL LOW (ref 3.5–5.0)
Alkaline Phosphatase: 90 U/L (ref 38–126)
Anion gap: 7 (ref 5–15)
BUN: 20 mg/dL (ref 8–23)
CO2: 27 mmol/L (ref 22–32)
Calcium: 8.8 mg/dL — ABNORMAL LOW (ref 8.9–10.3)
Chloride: 104 mmol/L (ref 98–111)
Creatinine, Ser: 1.24 mg/dL — ABNORMAL HIGH (ref 0.44–1.00)
GFR, Estimated: 47 mL/min — ABNORMAL LOW (ref >60)
Glucose, Bld: 241 mg/dL — ABNORMAL HIGH (ref 70–99)
Potassium: 3.7 mmol/L (ref 3.5–5.1)
Sodium: 138 mmol/L (ref 135–145)
Total Bilirubin: 0.3 mg/dL (ref 0.3–1.2)
Total Protein: 7.3 g/dL (ref 6.5–8.1)

## 2022-04-02 LAB — LIPASE, BLOOD: Lipase: 22 U/L (ref 11–51)

## 2022-04-02 LAB — CBC
HCT: 37 % (ref 36.0–46.0)
Hemoglobin: 11.6 g/dL — ABNORMAL LOW (ref 12.0–15.0)
MCH: 26.4 pg (ref 26.0–34.0)
MCHC: 31.4 g/dL (ref 30.0–36.0)
MCV: 84.3 fL (ref 80.0–100.0)
Platelets: 488 10*3/uL — ABNORMAL HIGH (ref 150–400)
RBC: 4.39 MIL/uL (ref 3.87–5.11)
RDW: 17.5 % — ABNORMAL HIGH (ref 11.5–15.5)
WBC: 10.7 10*3/uL — ABNORMAL HIGH (ref 4.0–10.5)
nRBC: 0 % (ref 0.0–0.2)

## 2022-04-02 MED ORDER — DOCUSATE SODIUM 50 MG/5ML PO LIQD
50.0000 mg | Freq: Once | ORAL | Status: AC
  Administered 2022-04-02: 50 mg
  Filled 2022-04-02: qty 10

## 2022-04-02 MED ORDER — MAGNESIUM CITRATE PO SOLN
1.0000 | Freq: Once | ORAL | Status: AC
  Administered 2022-04-02: 1 via ORAL
  Filled 2022-04-02: qty 296

## 2022-04-02 MED ORDER — DOCUSATE SODIUM 100 MG PO CAPS: 100.0000 mg | ORAL_CAPSULE | Freq: Every day | ORAL | 11 refills | Status: AC

## 2022-04-02 NOTE — ED Notes (Signed)
Report attempted to be called at 2120, 2121, and 2122. Report attempted again at 2127. There is no one answering and there is no operator available to assist.

## 2022-04-02 NOTE — ED Triage Notes (Signed)
Pt BIB EMS for chronic constipation. Per EMS, facility stated that pt had BM yesterday. Pt is from [facility].    135/76 110 100% on RA.

## 2022-04-02 NOTE — ED Notes (Signed)
Ems called to transport patient to [facility]

## 2022-04-02 NOTE — ED Provider Notes (Signed)
[facility] Provider Note  Patient Contact: 4:28 PM (approximate)   History   Constipation and Abdominal Pain   HPI  [name] is a 66 y.o. female who presents the emergency department complaining of left-sided abdominal pain and constipation.  Patient is on chronic narcotics, does not take any medications to assist with constipation.  Patient cannot remember the last time she had a bowel movement.  Patient is having some pain in the left lower abdomen.  No fevers or chills.  She states that now she is feeling like she has difficulty urinating as well though no dysuria, hematuria.  No fevers or chills are reported.  Patient states that she just started MiraLAX for the constipation but has had no success.     Physical Exam   Triage Vital Signs: ED Triage Vitals [04/02/22 1318]  Enc Vitals Group     BP (!) 151/71     Pulse Rate (!) 106     Resp 18     Temp 98.2 F (36.8 C)     Temp Source Oral     SpO2 99 %     Weight      Height      Head Circumference      Peak Flow      Pain Score 10     Pain Loc      Pain Edu?      Excl. in [place]?     Most recent vital signs: Vitals:   04/02/22 1930 04/02/22 2035  BP: (!) 137/59   Pulse: 91   Resp: 16   Temp:  98.6 F (37 C)  SpO2: 99%      General: Alert and in no acute distress.   Cardiovascular:  Good peripheral perfusion Respiratory: Normal respiratory effort without tachypnea or retractions. Lungs CTAB.  Gastrointestinal: Bowel sounds 4 quadrants.  Soft to palpation all quadrants.  Slight tenderness to the left lower quadrant.. No guarding or rigidity. No palpable masses. No distention. No CVA tenderness. Musculoskeletal: Full range of motion to all extremities.  Neurologic:  No gross focal neurologic deficits are appreciated.  Skin:   No rash noted Other:   ED Results / Procedures / Treatments   Labs (all labs ordered are listed, but only abnormal results are displayed) Labs  Reviewed  COMPREHENSIVE METABOLIC PANEL - Abnormal; Notable for the following components:      Result Value   Glucose, Bld 241 (*)    Creatinine, Ser 1.24 (*)    Calcium 8.8 (*)    Albumin 2.8 (*)    GFR, Estimated 47 (*)    All other components within normal limits  CBC - Abnormal; Notable for the following components:   WBC 10.7 (*)    Hemoglobin 11.6 (*)    RDW 17.5 (*)    Platelets 488 (*)    All other components within normal limits  LIPASE, BLOOD  URINALYSIS, ROUTINE W REFLEX MICROSCOPIC     EKG     RADIOLOGY  I personally viewed, evaluated, and interpreted these images as part of my medical decision making, as well as reviewing the written report by the radiologist.  ED Provider Interpretation: Evaluation of CT scan reveals no evidence of obstruction, colitis, diverticulitis processes reveals significant constipation with possible fecal impaction.  CT ABDOMEN PELVIS WO CONTRAST  Result Date: 04/02/2022 CLINICAL DATA:  Abdominal pain, constipation EXAM: CT ABDOMEN AND PELVIS WITHOUT CONTRAST TECHNIQUE: Multidetector CT imaging of the abdomen and pelvis was  performed following the standard protocol without IV contrast. RADIATION DOSE REDUCTION: This exam was performed according to the departmental dose-optimization program which includes automated exposure control, adjustment of the mA and/or kV according to patient size and/or use of iterative reconstruction technique. COMPARISON:  10/30/2021 FINDINGS: Lower chest: Coronary artery calcifications are seen. There is dense calcification in the mitral annulus. 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 Hepatobiliary: No focal abnormalities are seen in liver. There are multiple calcified gallbladder stones. There is no dilation of bile ducts. Pancreas: Pancreas appears smaller than usual in size. No focal abnormalities are seen. Spleen: Unremarkable. Adrenals/Urinary Tract: Adrenals are unremarkable. There is no  hydronephrosis. There are few low-density lesions in the renal cortex on both sides. Largest of the cysts is in the midportion of right kidney measuring 3.2 cm. There are no renal or ureteral stones. Urinary bladder is not distended. Beam hardening artifacts limiting evaluation of lower portion of the bladder. Stomach/Bowel: Stomach is unremarkable. Small bowel loops not dilated. Appendix is not dilated. There is no significant wall thickening in colon. Moderate to large amount of stool is seen in colon. Rectum is distended with stool. Transverse diameter of rectum measures 6.5 cm. Vascular/Lymphatic: Scattered arterial calcifications are seen in aorta and its major branches. Reproductive: Unremarkable. Other: There is no ascites or pneumoperitoneum. Musculoskeletal: Degenerative changes are noted in lumbar spine with encroachment of neural foramina from L3-S1 levels. Spinal stenosis is noted at multiple levels. There is right hip arthroplasty. IMPRESSION: There is no evidence of intestinal obstruction or pneumoperitoneum. There is no hydronephrosis. Appendix is not dilated. Gallbladder stones. Renal cysts. Moderate to large amount of stool is seen in colon and rectum. Possible fecal impaction is seen in rectum. Lumbar spondylosis. Coronary artery disease. 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 Recommend annual imaging followup by CTA or MRA. This recommendation follows 2010 ACCF/AHA/AATS/ACR/ASA/SCA/SCAI/SIR/STS/SVM Guidelines for the Diagnosis and Management of Patients with Thoracic Aortic Disease. Circulation. 2010; 121: F638-G665. Aortic aneurysm NOS (ICD10-I71.9) Other findings as described in the body of the report. Electronically Signed   By: Elmer Picker M.D.   On: 04/02/2022 17:13    PROCEDURES:  Critical Care performed: No  Procedures   MEDICATIONS ORDERED IN ED: Medications  magnesium citrate solution 1 Bottle (1 Bottle Oral Given 04/02/22 1950)  docusate  (COLACE) 50 MG/5ML liquid 50 mg (50 mg Per Tube Given 04/02/22 1950)     IMPRESSION / MDM / ASSESSMENT AND PLAN / ED COURSE  I reviewed the triage vital signs and the nursing notes.                              Differential diagnosis includes, but is not limited to, constipation, bowel obstruction, diverticulitis  Patient's presentation is most consistent with acute presentation with potential threat to life or bodily function.   Patient's diagnosis is consistent with constipation secondary to chronic opioid use.  Patient presents to the ED with constipation.  She is unsure when her last bowel movement was.  Patient is on chronic opioid therapy but is not on a regular stool softener or other medication for constipation prevention.  Patient had large amount of stool in CT, with possible impaction.  Manual disimpaction was performed with a moderate to large amount of stool removed.  Patient also had an enema which resulted in a large ball of stool being passed.  This time as patient is  largely immobile, on chronic opioid therapy I think she would benefit from chronic medication to help with constipation.  Patient should continue MiraLAX I will also prescribe a stool softener for the patient.  Patient is stable for discharge at this time.  Return precautions discussed..  Patient is given ED precautions to return to the ED for any worsening or new symptoms.        FINAL CLINICAL IMPRESSION(S) / ED DIAGNOSES   Final diagnoses:  Constipation due to opioid therapy     Rx / DC Orders   ED Discharge Orders          Ordered    docusate sodium (COLACE) 100 MG capsule  Daily        04/02/22 2106             Note:  This document was prepared using Dragon voice recognition software and may include unintentional dictation errors.   Brynda Peon 04/02/22 2106    Nena Polio, MD 04/03/22 1859

## 2022-04-02 NOTE — ED Triage Notes (Addendum)
Pt to ED via [facility] from [facility]. Pt reports constipation, rectal pain and abdominal pain. Pt states she is unsure of last BM. Facility states last BM was documented last night. Pt has oxycodone 5mg  table PRN Q6. Pt unsure of last dose.

## 2022-04-03 DIAGNOSIS — Z743 Need for continuous supervision: Secondary | ICD-10-CM | POA: Diagnosis not present

## 2022-04-03 DIAGNOSIS — K5900 Constipation, unspecified: Secondary | ICD-10-CM | POA: Diagnosis not present

## 2022-04-03 DIAGNOSIS — Z7401 Bed confinement status: Secondary | ICD-10-CM | POA: Diagnosis not present

## 2022-04-03 NOTE — ED Notes (Signed)
Attempted to call report, no answer

## 2022-04-05 DIAGNOSIS — Z79899 Other long term (current) drug therapy: Secondary | ICD-10-CM | POA: Diagnosis not present

## 2022-04-05 DIAGNOSIS — K5900 Constipation, unspecified: Secondary | ICD-10-CM | POA: Diagnosis not present

## 2022-04-05 DIAGNOSIS — E119 Type 2 diabetes mellitus without complications: Secondary | ICD-10-CM | POA: Diagnosis not present

## 2022-04-05 DIAGNOSIS — E039 Hypothyroidism, unspecified: Secondary | ICD-10-CM | POA: Diagnosis not present

## 2022-04-05 DIAGNOSIS — R69 Illness, unspecified: Secondary | ICD-10-CM | POA: Diagnosis not present

## 2022-04-05 DIAGNOSIS — I10 Essential (primary) hypertension: Secondary | ICD-10-CM | POA: Diagnosis not present

## 2022-04-07 DIAGNOSIS — Z89411 Acquired absence of right great toe: Secondary | ICD-10-CM | POA: Diagnosis not present

## 2022-04-07 DIAGNOSIS — L603 Nail dystrophy: Secondary | ICD-10-CM | POA: Diagnosis not present

## 2022-04-11 ENCOUNTER — Ambulatory Visit: Payer: Medicare HMO | Admitting: Physician Assistant

## 2022-04-11 ENCOUNTER — Telehealth: Payer: Self-pay

## 2022-04-11 NOTE — Telephone Encounter (Signed)
        Patient  visited [place] on 10/8    Telephone encounter attempt :  1st  A HIPAA compliant voice message was left requesting a return call.  Instructed patient to call back   [name] [facility] Guide, [place], Care Management  [phone] [address], [place], [place] [address] Phone: [phone] Email: [email or web address]       

## 2022-04-12 ENCOUNTER — Telehealth: Payer: Self-pay

## 2022-04-12 NOTE — Telephone Encounter (Signed)
        Patient  visited [place] on 10/8   Telephone encounter attempt :  2nd  A HIPAA compliant voice message was left requesting a return call.  Instructed patient to call back .    Jone Panebianco [facility] Care Guide, [place], Care Management  [phone] [address], [place], [place] [address] Phone: [phone] Email: [email or web address]       

## 2022-04-13 DIAGNOSIS — F411 Generalized anxiety disorder: Secondary | ICD-10-CM | POA: Diagnosis not present

## 2022-04-13 DIAGNOSIS — R69 Illness, unspecified: Secondary | ICD-10-CM | POA: Diagnosis not present

## 2022-04-13 DIAGNOSIS — F330 Major depressive disorder, recurrent, mild: Secondary | ICD-10-CM | POA: Diagnosis not present

## 2022-04-20 DIAGNOSIS — K5900 Constipation, unspecified: Secondary | ICD-10-CM | POA: Diagnosis not present

## 2022-04-20 DIAGNOSIS — G8929 Other chronic pain: Secondary | ICD-10-CM | POA: Diagnosis not present

## 2022-04-20 DIAGNOSIS — E039 Hypothyroidism, unspecified: Secondary | ICD-10-CM | POA: Diagnosis not present

## 2022-04-20 DIAGNOSIS — I739 Peripheral vascular disease, unspecified: Secondary | ICD-10-CM | POA: Diagnosis not present

## 2022-04-20 DIAGNOSIS — I890 Lymphedema, not elsewhere classified: Secondary | ICD-10-CM | POA: Diagnosis not present

## 2022-04-20 DIAGNOSIS — I509 Heart failure, unspecified: Secondary | ICD-10-CM | POA: Diagnosis not present

## 2022-04-20 DIAGNOSIS — E119 Type 2 diabetes mellitus without complications: Secondary | ICD-10-CM | POA: Diagnosis not present

## 2022-04-20 DIAGNOSIS — I10 Essential (primary) hypertension: Secondary | ICD-10-CM | POA: Diagnosis not present

## 2022-04-21 ENCOUNTER — Telehealth: Payer: Self-pay | Admitting: Family Medicine

## 2022-04-21 NOTE — Telephone Encounter (Signed)
Copied from [place] [id number] [phone]. Topic: Medicare AWV >> Apr 21, 2022 10:07 AM Jae Dire wrote: Reason for CRM:  No answer unable to leave a message for patient to call back and schedule Medicare Annual Wellness Visit (AWV) in office.   If unable to come into the office for AWV,  please offer to do virtually or by telephone.  Last AWV:   Please schedule at any time with [facility] Advisor.      30 minute appointment for Virtual or phone 45 minute appointment for in office or Initial virtual/phone  Any questions, please call me at [phone]

## 2022-04-25 ENCOUNTER — Encounter: Payer: Medicare HMO | Attending: Physician Assistant | Admitting: Physician Assistant

## 2022-04-25 DIAGNOSIS — L89153 Pressure ulcer of sacral region, stage 3: Secondary | ICD-10-CM | POA: Diagnosis not present

## 2022-04-25 DIAGNOSIS — L97222 Non-pressure chronic ulcer of left calf with fat layer exposed: Secondary | ICD-10-CM | POA: Diagnosis not present

## 2022-04-25 DIAGNOSIS — E039 Hypothyroidism, unspecified: Secondary | ICD-10-CM | POA: Diagnosis not present

## 2022-04-25 DIAGNOSIS — Z96649 Presence of unspecified artificial hip joint: Secondary | ICD-10-CM | POA: Diagnosis not present

## 2022-04-25 DIAGNOSIS — E1140 Type 2 diabetes mellitus with diabetic neuropathy, unspecified: Secondary | ICD-10-CM | POA: Diagnosis not present

## 2022-04-25 DIAGNOSIS — L89896 Pressure-induced deep tissue damage of other site: Secondary | ICD-10-CM | POA: Insufficient documentation

## 2022-04-25 DIAGNOSIS — I959 Hypotension, unspecified: Secondary | ICD-10-CM | POA: Diagnosis not present

## 2022-04-25 DIAGNOSIS — Z6841 Body Mass Index (BMI) 40.0 and over, adult: Secondary | ICD-10-CM | POA: Diagnosis not present

## 2022-04-25 DIAGNOSIS — E038 Other specified hypothyroidism: Secondary | ICD-10-CM | POA: Diagnosis not present

## 2022-04-25 DIAGNOSIS — I10 Essential (primary) hypertension: Secondary | ICD-10-CM | POA: Diagnosis not present

## 2022-04-25 DIAGNOSIS — L89893 Pressure ulcer of other site, stage 3: Secondary | ICD-10-CM | POA: Diagnosis not present

## 2022-04-25 DIAGNOSIS — I890 Lymphedema, not elsewhere classified: Secondary | ICD-10-CM | POA: Insufficient documentation

## 2022-04-25 DIAGNOSIS — E669 Obesity, unspecified: Secondary | ICD-10-CM | POA: Diagnosis not present

## 2022-04-25 DIAGNOSIS — L97822 Non-pressure chronic ulcer of other part of left lower leg with fat layer exposed: Secondary | ICD-10-CM | POA: Diagnosis not present

## 2022-04-25 DIAGNOSIS — L89616 Pressure-induced deep tissue damage of right heel: Secondary | ICD-10-CM | POA: Diagnosis not present

## 2022-04-25 DIAGNOSIS — Z743 Need for continuous supervision: Secondary | ICD-10-CM | POA: Diagnosis not present

## 2022-04-25 DIAGNOSIS — Z7401 Bed confinement status: Secondary | ICD-10-CM | POA: Diagnosis not present

## 2022-04-25 NOTE — Progress Notes (Signed)
CHANEQUA, SPEES ([id number]) [id number] Page 1 of 2 Visit Report for 04/25/2022 Chief Complaint Document Details Patient Name: [name], [name]. Date of Service: 04/25/2022 10:30 A M Medical Record Number: [id number] Patient Account Number: [id number] Date of Birth/Sex: Treating RN: 11/14/1955 (66 y.o. 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 New sacral wound as of 10/29/21 Electronic Signature(s) Signed: 04/25/2022 10:45:45 AM By: Worthy Keeler PA-C Entered By: Worthy Keeler on 04/25/2022 10:45:44 -------------------------------------------------------------------------------- Problem List Details Patient Name: [name]. Date of Service: 04/25/2022 10:30 A M Medical Record Number: [id number] Patient Account Number: [id number] Date of Birth/Sex: Treating RN: July 27, 1955 (66 y.o. [name] Primary Care Provider: [name] Other Clinician: Massie Kluver Referring Provider: Treating Provider/Extender: [name] Weeks in Treatment: 46 Active Problems ICD-10 Encounter Code Description Active Date MDM Diagnosis I89.0 Lymphedema, not elsewhere classified 06/03/2021 No Yes E11.622 Type 2 diabetes mellitus with other skin ulcer 06/03/2021 No Yes E11.40 Type 2 diabetes mellitus with diabetic neuropathy, 06/03/2021 No Yes unspecified MADDISEN, VOUGHT ([id number]) [id number] Page 2 of 2 [phone] Pressure ulcer of other site, stage 3 06/03/2021 No Yes L89.896 Pressure-induced deep tissue damage of other site 06/03/2021 No Yes L89.153 Pressure ulcer of sacral region, stage 3 08/27/2021 No Yes I10 Essential (primary) hypertension 06/03/2021  No Yes E03.8 Other specified hypothyroidism 06/03/2021 No Yes E66.09 Other obesity due to excess calories 06/03/2021 No Yes Inactive Problems Resolved Problems Electronic Signature(s) Signed: 04/25/2022 10:45:40 AM By: Worthy Keeler PA-C Entered By: Worthy Keeler on 04/25/2022 10:45:40

## 2022-04-26 NOTE — Progress Notes (Addendum)
ALYX, MCGUIRK ([id number]) [id number] Page 1 of 14 Visit Report for 04/25/2022 Arrival Information Details Patient Name: Date of Service: [name], [name] 04/25/2022 10:30 A M Medical Record Number: [id number] Patient Account Number: [id number] Date of Birth/Sex: Treating RN: March 12, 1956 (66 y.o. [name] Primary Care Dillard Pascal: [name] Other Clinician: Massie Kluver Referring Leshonda Galambos: Treating Shalaunda Weatherholtz/Extender: [name] Weeks in Treatment: 44 Visit Information History Since Last Visit All ordered tests and consults were completed: No Patient Arrived: Stretcher Added or deleted any medications: No Arrival Time: 10:40 Any new allergies or adverse reactions: No Transfer Assistance: None Had a fall or experienced change in No Patient Requires Transmission-Based Precautions: No activities of daily living that may affect Patient Has Alerts: Yes risk of falls: Patient Alerts: Patient on Blood Thinner Hospitalized since last visit: No TYPE II Diabetic Pain Present Now: No Plavix and Lovenox [place] Signature(s) Signed: 04/25/2022 5:32:10 PM By: Massie Kluver Entered By: Massie Kluver on 04/25/2022 10:42:28 -------------------------------------------------------------------------------- Clinic Level of Care Assessment Details Patient Name: Date of Service: Autumn, [name] 04/25/2022 10:30 A M Medical Record Number: [id number] Patient Account Number: [id number] Date of Birth/Sex: Treating RN: 28-Oct-1955 (66 y.o. [name] Primary Care Phu Record: [name] Other Clinician: Massie Kluver Referring Ayliana Casciano: Treating Honor Frison/Extender: [name] Weeks in Treatment: 46 Clinic Level of Care Assessment Items TOOL 4 Quantity Score []  - 0 Use when only an EandM is performed on FOLLOW-UP visit ASSESSMENTS - Nursing Assessment / Reassessment X- 1 10 Reassessment of  Co-morbidities (includes updates in patient status) X- 1 5 Reassessment of Adherence to Treatment Plan ASSESSMENTS - Wound and Skin A ssessment / Reassessment []  - 0 Simple Wound Assessment / Reassessment - one wound X- 3 5 Complex Wound Assessment / Reassessment - multiple wounds MANASVI, DICKARD ([id number]) [id number] Page 2 of 14 []  - 0 Dermatologic / Skin Assessment (not related to wound area) ASSESSMENTS - Focused Assessment X- 1 5 Circumferential Edema Measurements - multi extremities []  - 0 Nutritional Assessment / Counseling / Intervention []  - 0 Lower Extremity Assessment (monofilament, tuning fork, pulses) []  - 0 Peripheral Arterial Disease Assessment (using hand held doppler) ASSESSMENTS - Ostomy and/or Continence Assessment and Care []  - 0 Incontinence Assessment and Management []  - 0 Ostomy Care Assessment and Management (repouching, etc.) PROCESS - Coordination of Care X - Simple Patient / Family Education for ongoing care 1 15 []  - 0 Complex (extensive) Patient / Family Education for ongoing care []  - 0 Staff obtains Programmer, systems, Records, T Results / Process Orders est []  - 0 Staff telephones HHA, Nursing Homes / Clarify orders / etc []  - 0 Routine Transfer to another Facility (non-emergent condition) []  - 0 Routine Hospital Admission (non-emergent condition) []  - 0 New Admissions / Biomedical engineer / Ordering NPWT Apligraf, etc. , []  - 0 Emergency Hospital Admission (emergent condition) X- 1 10 Simple Discharge Coordination []  - 0 Complex (extensive) Discharge Coordination PROCESS - Special Needs []  - 0 Pediatric / Minor Patient Management []  - 0 Isolation Patient Management []  - 0 Hearing / Language / Visual special needs []  - 0 Assessment of Community assistance (transportation, D/C planning, etc.) []  - 0 Additional assistance / Altered mentation []  - 0 Support Surface(s) Assessment (bed, cushion, seat,  etc.) INTERVENTIONS - Wound Cleansing / Measurement []  - 0 Simple Wound Cleansing - one wound X- 3 5 Complex Wound Cleansing - multiple wounds X- 1 5 Wound Imaging (photographs -  any number of wounds) []  - 0 Wound Tracing (instead of photographs) []  - 0 Simple Wound Measurement - one wound X- 3 5 Complex Wound Measurement - multiple wounds INTERVENTIONS - Wound Dressings []  - 0 Small Wound Dressing one or multiple wounds []  - 0 Medium Wound Dressing one or multiple wounds X- 3 20 Large Wound Dressing one or multiple wounds []  - 0 Application of Medications - topical []  - 0 Application of Medications - injection INTERVENTIONS - Miscellaneous []  - 0 External ear exam []  - 0 Specimen Collection (cultures, biopsies, blood, body fluids, etc.) []  - 0 Specimen(s) / Culture(s) sent or taken to Lab for analysis MADALYNN, PICKELSIMER ([id number]) [id number] Page 3 of 14 []  - 0 Patient Transfer (multiple staff / Civil Service fast streamer / Similar devices) []  - 0 Simple Staple / Suture removal (25 or less) []  - 0 Complex Staple / Suture removal (26 or more) []  - 0 Hypo / Hyperglycemic Management (close monitor of Blood Glucose) []  - 0 Ankle / Brachial Index (ABI) - do not check if billed separately X- 1 5 Vital Signs Has the patient been seen at the hospital within the last three years: Yes Total Score: 160 Level Of Care: New/Established - Level 5 Electronic Signature(s) Signed: 04/25/2022 5:32:10 PM By: Massie Kluver Entered By: Massie Kluver on 04/25/2022 11:34:02 -------------------------------------------------------------------------------- Encounter Discharge Information Details Patient Name: Date of Service: [name]. 04/25/2022 10:30 A M Medical Record Number: [id number] Patient Account Number: [id number] Date of Birth/Sex: Treating RN: April 26, 1956 (66 y.o. [name] Primary Care Samit Sylve: [name] Other Clinician: Massie Kluver Referring Kedrick Mcnamee: Treating Tifanie Gardiner/Extender: [name] Weeks in Treatment: 56 Encounter Discharge Information Items Discharge Condition: Stable Ambulatory Status: Stretcher Discharge Destination: Skilled Nursing Facility Telephoned: Yes Orders Sent: Yes Transportation: Ambulance Accompanied By: self Schedule Follow-up Appointment: Yes Clinical Summary of Care: Electronic Signature(s) Signed: 04/25/2022 5:32:10 PM By: Massie Kluver Entered By: Massie Kluver on 04/25/2022 11:50:47 -------------------------------------------------------------------------------- Lower Extremity Assessment Details Patient Name: Date of Service: TAURUS, WILLIS 04/25/2022 10:30 A M Medical Record Number: [id number] Patient Account Number: [id number] Date of Birth/Sex: Treating RN: 09-11-1955 (67 y.o. [name], Olukemi Raliegh Ip ([id number]) [id number] Page 4 of 14 Primary Care Monque Haggar: [name] Other Clinician: Massie Kluver Referring Taquisha Phung: Treating Ketina Mars/Extender: [name] Weeks in Treatment: 46 Electronic Signature(s) Signed: 04/25/2022 5:32:10 PM By: Massie Kluver Signed: 04/26/2022 12:02:04 PM By: Gretta Cool BSN, RN, CWS, Kim RN, BSN Entered By: Massie Kluver on 04/25/2022 11:07:18 -------------------------------------------------------------------------------- Multi Wound Chart Details Patient Name: Date of Service: [name]. 04/25/2022 10:30 A M Medical Record Number: [id number] Patient Account Number: [id number] Date of Birth/Sex: Treating RN: [birth year] (66 y.o. [name] Primary Care Aleera Gilcrease: [name] Other Clinician: Massie Kluver Referring Shawneen Deetz: Treating Tae Robak/Extender: [name] Weeks in Treatment: 104 Vital Signs Height(in): 65 Pulse(bpm): 78 Weight(lbs): 245.4 Blood Pressure(mmHg): 112/61 Body Mass Index(BMI): 40.8 Temperature(F):  98.3 Respiratory Rate(breaths/min): 16 [60:Photos:] Right, Circumferential Lower Leg Left, Posterior Lower Leg Left, Lateral Lower Leg Wound Location: Pressure Injury Gradually Appeared Gradually Appeared Wounding Event: Pressure Ulcer Lymphedema Lymphedema Primary Etiology: Lymphedema, Hypertension, Lymphedema, Hypertension, Lymphedema, Hypertension, Comorbid History: Peripheral Arterial Disease, Peripheral Peripheral Arterial Disease, Peripheral Peripheral Arterial Disease, Peripheral Venous Disease, Type II Diabetes, Venous Disease, Type II Diabetes, Venous Disease, Type II Diabetes, End Stage Renal Disease, End Stage Renal Disease, End Stage Renal Disease, Osteoarthritis, Neuropathy Osteoarthritis, Neuropathy Osteoarthritis, Neuropathy 05/17/2021 06/11/2021 06/11/2021 Date Acquired: 30 43  43 Weeks of Treatment: Open Open Open Wound Status: No No No Wound Recurrence: 14.8x18.5x0.1 6.3x12x0.1 13.1x6.4x0.4 Measurements L x W x D (cm) 215.042 59.376 65.848 A (cm) : rea 21.504 5.938 26.339 Volume (cm) : -37960.50% -86.70% -6942.60% % Reduction in Area: -37626.30% -86.70% -28221.50% % Reduction in VolumeMARIENE, DICKERMAN ([id number]) [id number] Page 5 of 14 Category/Stage III Full Thickness With Exposed Support Full Thickness Without Exposed Classification: Structures Support Structures Large Large Large Exudate A mount: Sanguinous Serosanguineous Sanguinous Exudate Type: red red, brown red Exudate Color: Small (1-33%) Medium (34-66%) Small (1-33%) Granulation A mount: Red Red Red, Pale Granulation Quality: Small (1-33%) Small (1-33%) Small (1-33%) Necrotic A mount: Small (1-33%) None None Epithelialization: Wound Number: 72 N/A N/A Photos: No Photos N/A N/A Right Calcaneus N/A N/A Wound Location: Pressure Injury N/A N/A Wounding Event: Pressure Ulcer N/A N/A Primary Etiology: N/A N/A N/A Comorbid History: 01/13/2022 N/A  N/A Date Acquired: 22 N/A N/A Weeks of Treatment: Open N/A N/A Wound Status: No N/A N/A Wound Recurrence: 0.1x0.1x0.1 N/A N/A Measurements L x W x D (cm) 0.008 N/A N/A A (cm) : rea 0.001 N/A N/A Volume (cm) : 99.70% N/A N/A % Reduction in A rea: 99.60% N/A N/A % Reduction in Volume: Category/Stage II N/A N/A Classification: Medium N/A N/A Exudate A mount: Serosanguineous N/A N/A Exudate Type: red, brown N/A N/A Exudate Color: N/A N/A N/A Granulation A mount: N/A N/A N/A Granulation Quality: N/A N/A N/A Necrotic A mount: N/A N/A N/A Exposed Structures: N/A N/A N/A Epithelialization: Treatment Notes Electronic Signature(s) Signed: 04/25/2022 5:32:10 PM By: Massie Kluver Entered By: Massie Kluver on 04/25/2022 11:23:42 -------------------------------------------------------------------------------- Multi-Disciplinary Care Plan Details Patient Name: Date of Service: [name]. 04/25/2022 10:30 A M Medical Record Number: [id number] Patient Account Number: [id number] Date of Birth/Sex: Treating RN: Jan 16, 1956 (66 y.o. [name] Primary Care Unknown Flannigan: [name] Other Clinician: Massie Kluver Referring Maui Britten: Treating Justan Gaede/Extender: [name] Weeks in Treatment: 46 Active Inactive Necrotic Tissue Nursing Diagnoses: Impaired tissue integrity related to necrotic/devitalized tissue Knowledge deficit related to management of necrotic/devitalized tissue Goals: Necrotic/devitalized tissue will be minimized in the wound bed Date Initiated: 11/26/2021 Target Resolution Date: 11/26/2021 Goal Status: Active MINDEL, FRISCIA ([id number]) [id number] Page 6 of 14 Patient/caregiver will verbalize understanding of reason and process for debridement of necrotic tissue Date Initiated: 11/26/2021 Target Resolution Date: 11/26/2021 Goal Status: Active Interventions: Assess patient pain level pre-, during and  post procedure and prior to discharge Provide education on necrotic tissue and debridement process Treatment Activities: Apply topical anesthetic as ordered : 11/26/2021 Notes: Venous Leg Ulcer Nursing Diagnoses: Actual venous Insuffiency (use after diagnosis is confirmed) Knowledge deficit related to disease process and management Potential for venous Insuffiency (use before diagnosis confirmed) Goals: Patient will maintain optimal edema control Date Initiated: 11/26/2021 Target Resolution Date: 11/26/2021 Goal Status: Active Interventions: Assess peripheral edema status every visit. Notes: Wound/Skin Impairment Nursing Diagnoses: Impaired tissue integrity Knowledge deficit related to smoking impact on wound healing Knowledge deficit related to ulceration/compromised skin integrity Goals: Patient/caregiver will verbalize understanding of skin care regimen Date Initiated: 06/03/2021 Target Resolution Date: 06/03/2021 Goal Status: Active Interventions: Assess ulceration(s) every visit Provide education on ulcer and skin care Treatment Activities: Skin care regimen initiated : 06/03/2021 Topical wound management initiated : 06/03/2021 Notes: Electronic Signature(s) Signed: 04/25/2022 5:32:10 PM By: Massie Kluver Signed: 04/26/2022 12:02:04 PM By: Gretta Cool, BSN, RN, CWS, Kim RN, BSN Entered By: Massie Kluver on 04/25/2022 11:23:33 -------------------------------------------------------------------------------- Pain Assessment Details Patient  Name: Date of Service: VIVYAN, BIGGERS 04/25/2022 10:30 A M Medical Record Number: [id number] Patient Account Number: [id number] Date of Birth/Sex: Treating RN: 02-Dec-1955 (66 y.o. [name] Primary Care Avarey Yaeger: [name] Other Clinician: Jazmine, Heckman ([id number]) [id number] Page 7 of 14 Referring Lanier Felty: Treating Aury Scollard/Extender: [name] Weeks in Treatment:  46 Active Problems Location of Pain Severity and Description of Pain Patient Has Paino No Site Locations Pain Management and Medication Current Pain Management: Electronic Signature(s) Signed: 04/25/2022 5:32:10 PM By: Massie Kluver Signed: 04/26/2022 12:02:04 PM By: Gretta Cool, BSN, RN, CWS, Kim RN, BSN Entered By: Massie Kluver on 04/25/2022 10:46:37 -------------------------------------------------------------------------------- Patient/Caregiver Education Details Patient Name: Date of Service: [name] 10/30/2023andnbsp10:30 A M Medical Record Number: [id number] Patient Account Number: [id number] Date of Birth/Gender: Treating RN: January 18, 1956 (66 y.o. [name] Primary Care Physician: [name] Other Clinician: Massie Kluver Referring Physician: Treating Physician/Extender: [name] Weeks in Treatment: 31 Education Assessment Education Provided To: Patient Education Topics Provided Wound/Skin Impairment: Handouts: Other: continue wound care as directed Methods: Explain/Verbal Responses: State content correctly Electronic Signature(s) Signed: 04/25/2022 5:32:10 PM By: Clare Gandy ([id number]) [id number] Page 8 of 14 Entered By: Massie Kluver on 04/25/2022 11:34:33 -------------------------------------------------------------------------------- Wound Assessment Details Patient Name: Date of Service: TATYANA, BIBER 04/25/2022 10:30 A M Medical Record Number: [id number] Patient Account Number: [id number] Date of Birth/Sex: Treating RN: [birth year] (66 y.o. [name], Kim Primary Care Sabirin Baray: [name] Other Clinician: Massie Kluver Referring Tarique Loveall: Treating Sanford Lindblad/Extender: [name] Weeks in Treatment: 46 Wound Status Wound Number: 60 Primary Pressure Ulcer Etiology: Wound Location: Right, Circumferential Lower Leg Wound Open Wounding Event: Pressure  Injury Status: Date Acquired: 05/17/2021 Comorbid Lymphedema, Hypertension, Peripheral Arterial Disease, Peripheral Weeks Of Treatment: 46 History: Venous Disease, Type II Diabetes, End Stage Renal Disease, Clustered Wound: No Osteoarthritis, Neuropathy Photos Wound Measurements Length: (cm) 14.8 Width: (cm) 18.5 Depth: (cm) 0.1 Area: (cm) 215.042 Volume: (cm) 21.504 % Reduction in Area: -37960.5% % Reduction in Volume: -37626.3% Epithelialization: Small (1-33%) Wound Description Classification: Category/Stage III Exudate Amount: Large Exudate Type: Sanguinous Exudate Color: red Foul Odor After Cleansing: No Slough/Fibrino Yes Wound Bed Granulation Amount: Small (1-33%) Granulation Quality: Red Necrotic Amount: Small (1-33%) Necrotic Quality: Adherent Slough Treatment Notes Wound #60 (Lower Leg) Wound Laterality: Right, Circumferential Cleanser Normal Saline Discharge Instruction: Wash your hands with soap and water. Remove old dressing, discard into plastic bag and place into trash. Cleanse the wound with Normal Saline prior to applying a clean dressing using gauze sponges, not tissues or cotton balls. Do not scrub or use excessive BRALYN, ESPINO ([id number]) [id number] Page 9 of 14 force. Pat dry using gauze sponges, not tissue or cotton balls. Peri-Wound Care Topical Primary Dressing Aquacel Extra Hydrofiber Dressing, 4x5 (in/in) Secondary Dressing ABD Pad 5x9 (in/in) Discharge Instruction: 2. Cover with ABD pad Secured With ACE WRAP - 58M ACE Elastic Bandage With VELCRO Brand Closure, 4 (in) Discharge Instruction: 4. Ace wrap to secure dressing in place and control swelling. Kerlix Roll Sterile or Non-Sterile 6-ply 4.5x4 (yd/yd) Discharge Instruction: 3. Apply Kerlix as directed Compression Wrap Compression Stockings Add-Ons Electronic Signature(s) Signed: 04/25/2022 5:32:10 PM By: Massie Kluver Signed: 04/26/2022 12:02:04 PM  By: Gretta Cool, BSN, RN, CWS, Kim RN, BSN Entered By: Massie Kluver on 04/25/2022 11:09:18 -------------------------------------------------------------------------------- Wound Assessment Details Patient Name: Date of Service: [name]. 04/25/2022 10:30 A M Medical Record Number: [id number] Patient Account Number:  [id number] Date of Birth/Sex: Treating RN: 30-Sep-1955 (66 y.o. [name], Kim Primary Care Brandii Lakey: [name] Other Clinician: Massie Kluver Referring Rainelle Sulewski: Treating Lowery Paullin/Extender: [name] Weeks in Treatment: 46 Wound Status Wound Number: 61 Primary Lymphedema Etiology: Wound Location: Left, Posterior Lower Leg Wound Open Wounding Event: Gradually Appeared Status: Date Acquired: 06/11/2021 Comorbid Lymphedema, Hypertension, Peripheral Arterial Disease, Peripheral Weeks Of Treatment: 43 History: Venous Disease, Type II Diabetes, End Stage Renal Disease, Clustered Wound: No Osteoarthritis, Neuropathy Photos Wound Measurements Scarfone, Carletta K ([id number]) Length: (cm) 6.3 Width: (cm) 12 Depth: (cm) 0.1 Area: (cm) 59.376 Volume: (cm) 5.938 [id number] Page 10 of 14 % Reduction in Area: -86.7% % Reduction in Volume: -86.7% Epithelialization: None Wound Description Classification: Full Thickness With Exposed Support Structures Exudate Amount: Large Exudate Type: Serosanguineous Exudate Color: red, brown Foul Odor After Cleansing: No Slough/Fibrino Yes Wound Bed Granulation Amount: Medium (34-66%) Exposed Structure Granulation Quality: Red Fat Layer (Subcutaneous Tissue) Exposed: Yes Necrotic Amount: Small (1-33%) Necrotic Quality: Adherent Slough Treatment Notes Wound #61 (Lower Leg) Wound Laterality: Left, Posterior Cleanser Normal Saline Discharge Instruction: Wash your hands with soap and water. Remove old dressing, discard into plastic bag and place into trash. Cleanse the wound with  Normal Saline prior to applying a clean dressing using gauze sponges, not tissues or cotton balls. Do not scrub or use excessive force. Pat dry using gauze sponges, not tissue or cotton balls. Peri-Wound Care Topical Primary Dressing Aquacel Extra Hydrofiber Dressing, 4x5 (in/in) Secondary Dressing ABD Pad 5x9 (in/in) Discharge Instruction: 2. Cover with ABD pad Secured With ACE WRAP - 4M ACE Elastic Bandage With VELCRO Brand Closure, 4 (in) Discharge Instruction: 4. Ace wrap to secure dressing in place and control swelling. Kerlix Roll Sterile or Non-Sterile 6-ply 4.5x4 (yd/yd) Discharge Instruction: 3. Apply Kerlix as directed Compression Wrap Compression Stockings Add-Ons Electronic Signature(s) Signed: 04/25/2022 5:32:10 PM By: Massie Kluver Signed: 04/26/2022 12:02:04 PM By: Gretta Cool, BSN, RN, CWS, Kim RN, BSN Entered By: Massie Kluver on 04/25/2022 11:09:42 -------------------------------------------------------------------------------- Wound Assessment Details Patient Name: Date of Service: [name]. 04/25/2022 10:30 A M Medical Record Number: [id number] Patient Account Number: [id number] Date of Birth/Sex: Treating RN: 14-Jul-1955 (66 y.o. [name] Primary Care Marten Iles: [name] Other Clinician: Massie Kluver Referring Piya Mesch: Treating Fahd Galea/Extender: [name] [place], [name] ([id number]) [id number] Page 11 of 14 Weeks in Treatment: 46 Wound Status Wound Number: 62 Primary Lymphedema Etiology: Wound Location: Left, Lateral Lower Leg Wound Open Wounding Event: Gradually Appeared Status: Date Acquired: 06/11/2021 Comorbid Lymphedema, Hypertension, Peripheral Arterial Disease, Peripheral Weeks Of Treatment: 43 History: Venous Disease, Type II Diabetes, End Stage Renal Disease, Clustered Wound: No Osteoarthritis, Neuropathy Photos Wound Measurements Length: (cm) 13.1 Width: (cm) 6.4 Depth: (cm)  0.4 Area: (cm) 65.848 Volume: (cm) 26.339 % Reduction in Area: -6942.6% % Reduction in Volume: -28221.5% Epithelialization: None Wound Description Classification: Full Thickness Without Exposed Suppor Exudate Amount: Large Exudate Type: Sanguinous Exudate Color: red t Structures Wound Bed Granulation Amount: Small (1-33%) Exposed Structure Granulation Quality: Red, Pale Fat Layer (Subcutaneous Tissue) Exposed: Yes Necrotic Amount: Small (1-33%) Necrotic Quality: Adherent Slough Treatment Notes Wound #62 (Lower Leg) Wound Laterality: Left, Lateral Cleanser Normal Saline Discharge Instruction: Wash your hands with soap and water. Remove old dressing, discard into plastic bag and place into trash. Cleanse the wound with Normal Saline prior to applying a clean dressing using gauze sponges, not tissues or cotton balls. Do not scrub or use excessive force. Pat dry using gauze  sponges, not tissue or cotton balls. Peri-Wound Care Topical Primary Dressing Aquacel Extra Hydrofiber Dressing, 4x5 (in/in) Secondary Dressing ABD Pad 5x9 (in/in) Discharge Instruction: 2. Cover with ABD pad Secured With ACE WRAP - 37M ACE Elastic Bandage With VELCRO Brand Closure, 4 (in) Discharge Instruction: 4. Ace wrap to secure dressing in place and control swelling. Kerlix Roll Sterile or Non-Sterile 6-ply 4.5x4 (yd/yd) Discharge Instruction: 3. Apply Kerlix as directed Compression Wrap Compression Stockings JOWANDA, HEEG ([id number]) [id number] Page 12 of 14 Add-Ons Electronic Signature(s) Signed: 04/25/2022 5:32:10 PM By: Massie Kluver Signed: 04/26/2022 12:02:04 PM By: Gretta Cool, BSN, RN, CWS, Kim RN, BSN Entered By: Massie Kluver on 04/25/2022 11:10:09 -------------------------------------------------------------------------------- Wound Assessment Details Patient Name: Date of Service: [name]. 04/25/2022 10:30 A M Medical Record Number:  [id number] Patient Account Number: [id number] Date of Birth/Sex: Treating RN: 31-Mar-1956 (66 y.o. [name], Kim Primary Care Gurney Balthazor: [name] Other Clinician: Massie Kluver Referring Seng Fouts: Treating Aylin Rhoads/Extender: [name] Weeks in Treatment: 46 Wound Status Wound Number: 67 Primary Etiology: Pressure Ulcer Wound Location: Right Calcaneus Wound Status: Open Wounding Event: Pressure Injury Date Acquired: 01/13/2022 Weeks Of Treatment: 11 Clustered Wound: No Wound Measurements Length: (cm) 0.1 Width: (cm) 0.1 Depth: (cm) 0.1 Area: (cm) 0.008 Volume: (cm) 0.001 % Reduction in Area: 99.7% % Reduction in Volume: 99.6% Wound Description Classification: Category/Stage II Exudate Amount: Medium Exudate Type: Serosanguineous Exudate Color: red, brown Treatment Notes Wound #67 (Calcaneus) Wound Laterality: Right Cleanser Normal Saline Discharge Instruction: Wash your hands with soap and water. Remove old dressing, discard into plastic bag and place into trash. Cleanse the wound with Normal Saline prior to applying a clean dressing using gauze sponges, not tissues or cotton balls. Do not scrub or use excessive force. Pat dry using gauze sponges, not tissue or cotton balls. Peri-Wound Care Topical Primary Dressing Aquacel Extra Hydrofiber Dressing, 4x5 (in/in) Secondary Dressing ABD Pad 5x9 (in/in) Discharge Instruction: 2. Cover with ABD pad Secured With ACE WRAP - 37M ACE Elastic Bandage With VELCRO Brand Closure, 4 (in) Discharge Instruction: 4. Ace wrap to secure dressing in place and control swelling. REONNA, FINLAYSON ([id number]) [id number] Page 13 of 14 Kerlix Roll Sterile or Non-Sterile 6-ply 4.5x4 (yd/yd) Discharge Instruction: 3. Apply Kerlix as directed Compression Wrap Compression Stockings Add-Ons Electronic Signature(s) Signed: 04/25/2022 5:32:10 PM By: Massie Kluver Signed: 04/26/2022 12:02:04 PM By:  Gretta Cool, BSN, RN, CWS, Kim RN, BSN Entered By: Massie Kluver on 04/25/2022 11:10:31 -------------------------------------------------------------------------------- Wound Assessment Details Patient Name: Date of Service: [name]. 04/25/2022 10:30 A M Medical Record Number: [id number] Patient Account Number: [id number] Date of Birth/Sex: Treating RN: September 01, 1955 (66 y.o. [name] Primary [facility] Marisol Giambra: [name] Other Clinician: Massie Kluver Referring Rick Warnick: Treating Malia Corsi/Extender: [name] Weeks in Treatment: 46 Wound Status Wound Number: 68 Primary Etiology: Skin Tear Wound Location: Left, Anterior Lower Leg Wound Status: Converted Wounding Event: Shear/Friction Date Acquired: 12/27/2021 Weeks Of Treatment: 11 Clustered Wound: Yes Wound Description Classification: Full Thickness Without Exposed Support Exudate Amount: Medium Exudate Type: Serosanguineous Exudate Color: red, brown Structures Treatment Notes Wound #68 (Lower Leg) Wound Laterality: Left, Anterior Cleanser Peri-Wound Care Topical Primary Dressing Secondary Dressing Secured With Compression Wrap Compression Stockings Add-Ons Electronic Signature(s) Signed: 05/05/2022 5:19:54 PM By: Gretta Cool, BSN, RN, CWS, Kim RN, BSN Entered By: Gretta Cool, BSN, RN, CWS, Kim on 05/02/2022 16:02:55 [name] ([id number]) [id number] Page 14 of 14 -------------------------------------------------------------------------------- Vitals Details Patient Name: Date of Service: CHANDNI, GAGAN LYN K.  04/25/2022 10:30 A M Medical Record Number: [id number] Patient Account Number: [id number] Date of Birth/Sex: Treating RN: [birth year] (66 y.o. [name], Kim Primary Care Eris Hannan: [name] Other Clinician: Massie Kluver Referring [place] Liz: Treating Laurene Melendrez/Extender: [name] Weeks in Treatment: 46 Vital Signs Time Taken: 10:42 Temperature  (F): 98.3 Height (in): 65 Pulse (bpm): 78 Weight (lbs): 245.4 Respiratory Rate (breaths/min): 16 Body Mass Index (BMI): 40.8 Blood Pressure (mmHg): 112/61 Reference Range: 80 - 120 mg / dl Electronic Signature(s) Signed: 04/25/2022 5:32:10 PM By: Massie Kluver Entered By: Massie Kluver on 04/25/2022 10:46:29

## 2022-04-27 DIAGNOSIS — R69 Illness, unspecified: Secondary | ICD-10-CM | POA: Diagnosis not present

## 2022-04-27 DIAGNOSIS — F411 Generalized anxiety disorder: Secondary | ICD-10-CM | POA: Diagnosis not present

## 2022-04-27 DIAGNOSIS — F330 Major depressive disorder, recurrent, mild: Secondary | ICD-10-CM | POA: Diagnosis not present

## 2022-05-02 DIAGNOSIS — R0981 Nasal congestion: Secondary | ICD-10-CM | POA: Diagnosis not present

## 2022-05-02 DIAGNOSIS — E119 Type 2 diabetes mellitus without complications: Secondary | ICD-10-CM | POA: Diagnosis not present

## 2022-05-02 DIAGNOSIS — E039 Hypothyroidism, unspecified: Secondary | ICD-10-CM | POA: Diagnosis not present

## 2022-05-02 DIAGNOSIS — I509 Heart failure, unspecified: Secondary | ICD-10-CM | POA: Diagnosis not present

## 2022-05-02 DIAGNOSIS — Z79899 Other long term (current) drug therapy: Secondary | ICD-10-CM | POA: Diagnosis not present

## 2022-05-06 DIAGNOSIS — I10 Essential (primary) hypertension: Secondary | ICD-10-CM | POA: Diagnosis not present

## 2022-05-06 DIAGNOSIS — Z79899 Other long term (current) drug therapy: Secondary | ICD-10-CM | POA: Diagnosis not present

## 2022-05-06 DIAGNOSIS — E039 Hypothyroidism, unspecified: Secondary | ICD-10-CM | POA: Diagnosis not present

## 2022-05-06 DIAGNOSIS — E559 Vitamin D deficiency, unspecified: Secondary | ICD-10-CM | POA: Diagnosis not present

## 2022-05-06 DIAGNOSIS — N189 Chronic kidney disease, unspecified: Secondary | ICD-10-CM | POA: Diagnosis not present

## 2022-05-06 DIAGNOSIS — E119 Type 2 diabetes mellitus without complications: Secondary | ICD-10-CM | POA: Diagnosis not present

## 2022-05-06 DIAGNOSIS — G8929 Other chronic pain: Secondary | ICD-10-CM | POA: Diagnosis not present

## 2022-05-06 DIAGNOSIS — Z760 Encounter for issue of repeat prescription: Secondary | ICD-10-CM | POA: Diagnosis not present

## 2022-05-20 DIAGNOSIS — I739 Peripheral vascular disease, unspecified: Secondary | ICD-10-CM | POA: Diagnosis not present

## 2022-05-20 DIAGNOSIS — Z79899 Other long term (current) drug therapy: Secondary | ICD-10-CM | POA: Diagnosis not present

## 2022-05-20 DIAGNOSIS — E119 Type 2 diabetes mellitus without complications: Secondary | ICD-10-CM | POA: Diagnosis not present

## 2022-05-20 DIAGNOSIS — K5900 Constipation, unspecified: Secondary | ICD-10-CM | POA: Diagnosis not present

## 2022-05-20 DIAGNOSIS — I10 Essential (primary) hypertension: Secondary | ICD-10-CM | POA: Diagnosis not present

## 2022-05-20 DIAGNOSIS — E559 Vitamin D deficiency, unspecified: Secondary | ICD-10-CM | POA: Diagnosis not present

## 2022-05-20 DIAGNOSIS — E0590 Thyrotoxicosis, unspecified without thyrotoxic crisis or storm: Secondary | ICD-10-CM | POA: Diagnosis not present

## 2022-05-20 DIAGNOSIS — I509 Heart failure, unspecified: Secondary | ICD-10-CM | POA: Diagnosis not present

## 2022-05-20 DIAGNOSIS — N189 Chronic kidney disease, unspecified: Secondary | ICD-10-CM | POA: Diagnosis not present

## 2022-05-20 DIAGNOSIS — E039 Hypothyroidism, unspecified: Secondary | ICD-10-CM | POA: Diagnosis not present

## 2022-05-23 ENCOUNTER — Ambulatory Visit: Payer: Medicare HMO | Admitting: Physician Assistant

## 2022-05-23 DIAGNOSIS — Z79899 Other long term (current) drug therapy: Secondary | ICD-10-CM | POA: Diagnosis not present

## 2022-05-23 DIAGNOSIS — E039 Hypothyroidism, unspecified: Secondary | ICD-10-CM | POA: Diagnosis not present

## 2022-05-23 DIAGNOSIS — R69 Illness, unspecified: Secondary | ICD-10-CM | POA: Diagnosis not present

## 2022-05-23 DIAGNOSIS — E559 Vitamin D deficiency, unspecified: Secondary | ICD-10-CM | POA: Diagnosis not present

## 2022-05-23 DIAGNOSIS — I10 Essential (primary) hypertension: Secondary | ICD-10-CM | POA: Diagnosis not present

## 2022-05-23 DIAGNOSIS — Z5320 Procedure and treatment not carried out because of patient's decision for unspecified reasons: Secondary | ICD-10-CM | POA: Diagnosis not present

## 2022-05-23 DIAGNOSIS — E119 Type 2 diabetes mellitus without complications: Secondary | ICD-10-CM | POA: Diagnosis not present

## 2022-05-25 ENCOUNTER — Telehealth: Payer: Self-pay | Admitting: Family Medicine

## 2022-05-25 DIAGNOSIS — F330 Major depressive disorder, recurrent, mild: Secondary | ICD-10-CM | POA: Diagnosis not present

## 2022-05-25 DIAGNOSIS — R69 Illness, unspecified: Secondary | ICD-10-CM | POA: Diagnosis not present

## 2022-05-25 NOTE — Telephone Encounter (Signed)
Copied from [place] ([id number]. Topic: Medicare AWV >> May 25, 2022  2:13 PM Devoria Glassing wrote: Reason for CRM Called patient to schedule Medicare Annual Wellness Visit (AWV) with [facility] Health Advisor.  Appointment can be an offiice/telephone or virtual visit;  Please call [phone] ask for [facility]. No voice mail

## 2022-06-02 ENCOUNTER — Encounter: Payer: Medicare HMO | Attending: Physician Assistant | Admitting: Physician Assistant

## 2022-06-02 ENCOUNTER — Telehealth: Payer: Self-pay | Admitting: Family Medicine

## 2022-06-02 DIAGNOSIS — S81802A Unspecified open wound, left lower leg, initial encounter: Secondary | ICD-10-CM | POA: Diagnosis not present

## 2022-06-02 DIAGNOSIS — E1140 Type 2 diabetes mellitus with diabetic neuropathy, unspecified: Secondary | ICD-10-CM | POA: Diagnosis not present

## 2022-06-02 DIAGNOSIS — R531 Weakness: Secondary | ICD-10-CM | POA: Diagnosis not present

## 2022-06-02 DIAGNOSIS — I959 Hypotension, unspecified: Secondary | ICD-10-CM | POA: Diagnosis not present

## 2022-06-02 DIAGNOSIS — E1151 Type 2 diabetes mellitus with diabetic peripheral angiopathy without gangrene: Secondary | ICD-10-CM | POA: Insufficient documentation

## 2022-06-02 DIAGNOSIS — Z743 Need for continuous supervision: Secondary | ICD-10-CM | POA: Diagnosis not present

## 2022-06-02 DIAGNOSIS — Z7901 Long term (current) use of anticoagulants: Secondary | ICD-10-CM | POA: Diagnosis not present

## 2022-06-02 DIAGNOSIS — E1122 Type 2 diabetes mellitus with diabetic chronic kidney disease: Secondary | ICD-10-CM | POA: Diagnosis not present

## 2022-06-02 DIAGNOSIS — I890 Lymphedema, not elsewhere classified: Secondary | ICD-10-CM | POA: Diagnosis not present

## 2022-06-02 DIAGNOSIS — I120 Hypertensive chronic kidney disease with stage 5 chronic kidney disease or end stage renal disease: Secondary | ICD-10-CM | POA: Insufficient documentation

## 2022-06-02 DIAGNOSIS — E038 Other specified hypothyroidism: Secondary | ICD-10-CM | POA: Diagnosis not present

## 2022-06-02 DIAGNOSIS — N186 End stage renal disease: Secondary | ICD-10-CM | POA: Diagnosis not present

## 2022-06-02 DIAGNOSIS — E11622 Type 2 diabetes mellitus with other skin ulcer: Secondary | ICD-10-CM | POA: Diagnosis not present

## 2022-06-02 DIAGNOSIS — Z7401 Bed confinement status: Secondary | ICD-10-CM | POA: Diagnosis not present

## 2022-06-02 DIAGNOSIS — R29898 Other symptoms and signs involving the musculoskeletal system: Secondary | ICD-10-CM | POA: Diagnosis not present

## 2022-06-02 DIAGNOSIS — L89893 Pressure ulcer of other site, stage 3: Secondary | ICD-10-CM | POA: Diagnosis not present

## 2022-06-02 DIAGNOSIS — M1990 Unspecified osteoarthritis, unspecified site: Secondary | ICD-10-CM | POA: Diagnosis not present

## 2022-06-02 DIAGNOSIS — R5381 Other malaise: Secondary | ICD-10-CM | POA: Diagnosis not present

## 2022-06-02 DIAGNOSIS — W19XXXA Unspecified fall, initial encounter: Secondary | ICD-10-CM | POA: Diagnosis not present

## 2022-06-02 DIAGNOSIS — L89610 Pressure ulcer of right heel, unstageable: Secondary | ICD-10-CM | POA: Diagnosis not present

## 2022-06-02 DIAGNOSIS — L89896 Pressure-induced deep tissue damage of other site: Secondary | ICD-10-CM | POA: Insufficient documentation

## 2022-06-02 DIAGNOSIS — Z7902 Long term (current) use of antithrombotics/antiplatelets: Secondary | ICD-10-CM | POA: Insufficient documentation

## 2022-06-02 DIAGNOSIS — L89153 Pressure ulcer of sacral region, stage 3: Secondary | ICD-10-CM | POA: Insufficient documentation

## 2022-06-02 NOTE — Progress Notes (Addendum)
Autumn, [name] ([id number]) [id number] Page 1 of 14 Visit Report for 06/02/2022 Arrival Information Details Patient Name: Date of Service: [name], [name] 06/02/2022 12:45 PM Medical Record Number: [id number] Patient Account Number: [id number] Date of Birth/Sex: Treating RN: [birth year] (66 y.o. [name] Primary Care Shekera Beavers: [name] Other Clinician: Massie Kluver Referring Anitha Kreiser: Treating Cheryllynn Sarff/Extender: [name] Weeks in Treatment: 26 Visit Information History Since Last Visit All ordered tests and consults were completed: No Patient Arrived: Stretcher Added or deleted any medications: No Arrival Time: 12:52 Any new allergies or adverse reactions: No Transfer Assistance: Stretcher Had a fall or experienced change in No Patient Identification Verified: Yes activities of daily living that may affect Secondary Verification Process Completed: Yes risk of falls: Patient Requires Transmission-Based Precautions: No Signs or symptoms of abuse/neglect since last visito No Patient Has Alerts: Yes Hospitalized since last visit: No Patient Alerts: Patient on Blood Thinner Implantable device outside of the clinic excluding No TYPE II Diabetic cellular tissue based products placed in the center Plavix and Lovenox since last visit: [place] in Place as Prescribed: Yes Pain Present Now: Yes Electronic Signature(s) Signed: 06/03/2022 10:21:17 AM By: Massie Kluver Entered By: Massie Kluver on 06/02/2022 12:53:27 -------------------------------------------------------------------------------- Clinic Level of Care Assessment Details Patient Name: Date of Service: [name], [name] 06/02/2022 12:45 PM Medical Record Number: [id number] Patient Account Number: [id number] Date of Birth/Sex: Treating RN: 01/17/1956 (66 y.o. [name] Primary Care Charlene Detter: [name] Other Clinician: Massie Kluver Referring Ree Alcalde: Treating Kripa Foskey/Extender: [name] Weeks in Treatment: 52 Clinic Level of Care Assessment Items TOOL 4 Quantity Score []  - 0 Use when only an EandM is performed on FOLLOW-UP visit ASSESSMENTS - Nursing Assessment / Reassessment X- 1 10 Reassessment of Co-morbidities (includes updates in patient status) X- 1 5 Reassessment of Adherence to Treatment Plan [name], [name] ([id number]) [id number] Page 2 of 14 ASSESSMENTS - Wound and Skin A ssessment / Reassessment []  - 0 Simple Wound Assessment / Reassessment - one wound X- 4 5 Complex Wound Assessment / Reassessment - multiple wounds []  - 0 Dermatologic / Skin Assessment (not related to wound area) ASSESSMENTS - Focused Assessment []  - 0 Circumferential Edema Measurements - multi extremities []  - 0 Nutritional Assessment / Counseling / Intervention []  - 0 Lower Extremity Assessment (monofilament, tuning fork, pulses) []  - 0 Peripheral Arterial Disease Assessment (using hand held doppler) ASSESSMENTS - Ostomy and/or Continence Assessment and Care []  - 0 Incontinence Assessment and Management []  - 0 Ostomy Care Assessment and Management (repouching, etc.) PROCESS - Coordination of Care X - Simple Patient / Family Education for ongoing care 1 15 []  - 0 Complex (extensive) Patient / Family Education for ongoing care []  - 0 Staff obtains Programmer, systems, Records, T Results / Process Orders est []  - 0 Staff telephones HHA, Nursing Homes / Clarify orders / etc []  - 0 Routine Transfer to another Facility (non-emergent condition) []  - 0 Routine Hospital Admission (non-emergent condition) []  - 0 New Admissions / Biomedical engineer / Ordering NPWT Apligraf, etc. , []  - 0 Emergency Hospital Admission (emergent condition) X- 1 10 Simple Discharge Coordination []  - 0 Complex (extensive) Discharge Coordination PROCESS - Special Needs []  - 0 Pediatric /  Minor Patient Management []  - 0 Isolation Patient Management []  - 0 Hearing / Language / Visual special needs []  - 0 Assessment of Community assistance (transportation, D/C planning, etc.) []  - 0 Additional assistance / Altered  mentation []  - 0 Support Surface(s) Assessment (bed, cushion, seat, etc.) INTERVENTIONS - Wound Cleansing / Measurement []  - 0 Simple Wound Cleansing - one wound X- 4 5 Complex Wound Cleansing - multiple wounds X- 1 5 Wound Imaging (photographs - any number of wounds) []  - 0 Wound Tracing (instead of photographs) []  - 0 Simple Wound Measurement - one wound X- 4 5 Complex Wound Measurement - multiple wounds INTERVENTIONS - Wound Dressings []  - 0 Small Wound Dressing one or multiple wounds []  - 0 Medium Wound Dressing one or multiple wounds []  - 0 Large Wound Dressing one or multiple wounds X- 1 5 Application of Medications - topical []  - 0 Application of Medications - injection INTERVENTIONS - Miscellaneous []  - 0 External ear exam [name], [name] ([id number]) [id number] Page 3 of 14 []  - 0 Specimen Collection (cultures, biopsies, blood, body fluids, etc.) []  - 0 Specimen(s) / Culture(s) sent or taken to Lab for analysis []  - 0 Patient Transfer (multiple staff / Harrel Lemon Lift / Similar devices) []  - 0 Simple Staple / Suture removal (25 or less) []  - 0 Complex Staple / Suture removal (26 or more) []  - 0 Hypo / Hyperglycemic Management (close monitor of Blood Glucose) []  - 0 Ankle / Brachial Index (ABI) - do not check if billed separately X- 1 5 Vital Signs Has the patient been seen at the hospital within the last three years: Yes Total Score: 115 Level Of Care: New/Established - Level 3 Electronic Signature(s) Signed: 06/03/2022 10:21:17 AM By: Massie Kluver Entered By: Massie Kluver on 06/02/2022 13:45:21 -------------------------------------------------------------------------------- Encounter Discharge  Information Details Patient Name: Date of Service: [name]. 06/02/2022 12:45 PM Medical Record Number: [id number] Patient Account Number: [id number] Date of Birth/Sex: Treating RN: 04-09-1956 (66 y.o. [name] Primary Care Apphia Cropley: [name] Other Clinician: Massie Kluver Referring Faust Thorington: Treating Alex Mcmanigal/Extender: [name] Weeks in Treatment: 75 Encounter Discharge Information Items Discharge Condition: Stable Ambulatory Status: Stretcher Discharge Destination: [place] Telephoned: No Orders Sent: Yes Transportation: Ambulance Accompanied By: self Schedule Follow-up Appointment: Yes Clinical Summary of Care: Electronic Signature(s) Signed: 06/03/2022 10:21:17 AM By: Massie Kluver Entered By: Massie Kluver on 06/02/2022 16:35:10 [name] ([id number]) [id number] Page 4 of 14 -------------------------------------------------------------------------------- Lower Extremity Assessment Details Patient Name: Date of Service: [name], [name] 06/02/2022 12:45 PM Medical Record Number: [id number] Patient Account Number: [id number] Date of Birth/Sex: Treating RN: 31-Jul-1955 (66 y.o. [name] Primary Care Brittney Mucha: [name] Other Clinician: Massie Kluver Referring Iyani Dresner: Treating Rether Rison/Extender: [name] Weeks in Treatment: 52 Vascular Assessment Pulses: Dorsalis Pedis Palpable: [Left:Yes] [Right:Yes] Electronic Signature(s) Signed: 06/03/2022 10:21:17 AM By: Massie Kluver Signed: 06/03/2022 11:32:10 AM By: Gretta Cool, BSN, RN, CWS, Kim RN, BSN Entered By: Massie Kluver on 06/02/2022 13:17:42 -------------------------------------------------------------------------------- Multi Wound Chart Details Patient Name: Date of Service: [name]. 06/02/2022 12:45 PM Medical Record Number: [id number] Patient Account Number: [id number] Date of Birth/Sex:  Treating RN: [birth year] (66 y.o. [name] Primary Care Sharone Almond: [name] Other Clinician: Massie Kluver Referring Paige Vanderwoude: Treating Jillayne Witte/Extender: [name] Weeks in Treatment: 33 Vital Signs Height(in): 65 Pulse(bpm): 93 Weight(lbs): 245.4 Blood Pressure(mmHg): 149/77 Body Mass Index(BMI): 40.8 Temperature(F): 98.4 Respiratory Rate(breaths/min): 16 [60:Photos:] Right, Circumferential Lower Leg Left, Posterior Lower Leg Left, Lateral Lower Leg Wound Location: Pressure Injury Gradually Appeared Gradually Appeared Wounding Event: Pressure Ulcer Lymphedema Lymphedema Primary Etiology: DAWNMARIE, BREON ([id number]) [id number] Page 5 of 14 Lymphedema, Hypertension, Lymphedema, Hypertension,  Lymphedema, Hypertension, Comorbid History: Peripheral Arterial Disease, Peripheral Peripheral Arterial Disease, Peripheral Peripheral Arterial Disease, Peripheral Venous Disease, Type II Diabetes, Venous Disease, Type II Diabetes, Venous Disease, Type II Diabetes, End Stage Renal Disease, End Stage Renal Disease, End Stage Renal Disease, Osteoarthritis, Neuropathy Osteoarthritis, Neuropathy Osteoarthritis, Neuropathy 05/17/2021 06/11/2021 06/11/2021 Date Acquired: 52 48 48 Weeks of Treatment: Open Open Open Wound Status: No No No Wound Recurrence: 20x17x0.1 1.5x1x0.1 19x9x0.1 Measurements L x W x D (cm) 267.035 1.178 134.303 A (cm) : rea 26.704 0.118 13.43 Volume (cm) : -47162.80% 96.30% -14264.00% % Reduction in Area: -46749.10% 96.30% -14340.90% % Reduction in Volume: Category/Stage III Full Thickness With Exposed Support Full Thickness Without Exposed Classification: Structures Support Structures Large Large Large Exudate A mount: Sanguinous Serosanguineous Sanguinous Exudate Type: red red, brown red Exudate Color: Small (1-33%) Medium (34-66%) Small (1-33%) Granulation A mount: Red Red Red, Pale Granulation  Quality: Small (1-33%) Small (1-33%) Small (1-33%) Necrotic A mount: Small (1-33%) None None Epithelialization: Wound Number: 24 N/A N/A Photos: N/A N/A Right Calcaneus N/A N/A Wound Location: Pressure Injury N/A N/A Wounding Event: Pressure Ulcer N/A N/A Primary Etiology: Lymphedema, Hypertension, N/A N/A Comorbid History: Peripheral Arterial Disease, Peripheral Venous Disease, Type II Diabetes, End Stage Renal Disease, Osteoarthritis, Neuropathy 01/13/2022 N/A N/A Date Acquired: 17 N/A N/A Weeks of Treatment: Open N/A N/A Wound Status: No N/A N/A Wound Recurrence: 1x1.5x0.1 N/A N/A Measurements L x W x D (cm) 1.178 N/A N/A A (cm) : rea 0.118 N/A N/A Volume (cm) : 50.00% N/A N/A % Reduction in A rea: 50.00% N/A N/A % Reduction in Volume: Category/Stage II N/A N/A Classification: Medium N/A N/A Exudate A mount: Serosanguineous N/A N/A Exudate Type: red, brown N/A N/A Exudate Color: N/A N/A N/A Granulation A mount: N/A N/A N/A Granulation Quality: N/A N/A N/A Necrotic A mount: N/A N/A N/A Exposed Structures: N/A N/A N/A Epithelialization: Treatment Notes Electronic Signature(s) Signed: 06/03/2022 10:21:17 AM By: Massie Kluver Entered By: Massie Kluver on 06/02/2022 13:24:39 [name] ([id number]) [id number] Page 6 of 14 -------------------------------------------------------------------------------- Multi-Disciplinary Care Plan Details Patient Name: Date of Service: [name], [name] 06/02/2022 12:45 PM Medical Record Number: [id number] Patient Account Number: [id number] Date of Birth/Sex: Treating RN: Nov 25, 1955 (66 y.o. [name] Primary Care Mescal Flinchbaugh: [name] Other Clinician: Massie Kluver Referring Shellye Zandi: Treating Sallyanne Birkhead/Extender: [name] Weeks in Treatment: 52 Active Inactive Necrotic Tissue Nursing Diagnoses: Impaired tissue integrity related to necrotic/devitalized  tissue Knowledge deficit related to management of necrotic/devitalized tissue Goals: Necrotic/devitalized tissue will be minimized in the wound bed Date Initiated: 11/26/2021 Target Resolution Date: 11/26/2021 Goal Status: Active Patient/caregiver will verbalize understanding of reason and process for debridement of necrotic tissue Date Initiated: 11/26/2021 Target Resolution Date: 11/26/2021 Goal Status: Active Interventions: Assess patient pain level pre-, during and post procedure and prior to discharge Provide education on necrotic tissue and debridement process Treatment Activities: Apply topical anesthetic as ordered : 11/26/2021 Notes: Venous Leg Ulcer Nursing Diagnoses: Actual venous Insuffiency (use after diagnosis is confirmed) Knowledge deficit related to disease process and management Potential for venous Insuffiency (use before diagnosis confirmed) Goals: Patient will maintain optimal edema control Date Initiated: 11/26/2021 Target Resolution Date: 11/26/2021 Goal Status: Active Interventions: Assess peripheral edema status every visit. Notes: Wound/Skin Impairment Nursing Diagnoses: Impaired tissue integrity Knowledge deficit related to smoking impact on wound healing Knowledge deficit related to ulceration/compromised skin integrity Goals: Patient/caregiver will verbalize understanding of skin care regimen Date Initiated: 06/03/2021 Target Resolution Date: 06/03/2021 Goal Status: Active  [name], [name] ([id number]) [id number] Page 7 of 14 Interventions: Assess ulceration(s) every visit Provide education on ulcer and skin care Treatment Activities: Skin care regimen initiated : 06/03/2021 Topical wound management initiated : 06/03/2021 Notes: Electronic Signature(s) Signed: 06/03/2022 10:21:17 AM By: Massie Kluver Signed: 06/03/2022 11:32:10 AM By: Gretta Cool, BSN, RN, CWS, Kim RN, BSN Entered By: Massie Kluver on 06/02/2022  16:34:08 -------------------------------------------------------------------------------- Pain Assessment Details Patient Name: Date of Service: [name]. 06/02/2022 12:45 PM Medical Record Number: [id number] Patient Account Number: [id number] Date of Birth/Sex: Treating RN: 06-Jan-1956 (66 y.o. [name] Primary Care Dominick Morella: [name] Other Clinician: Massie Kluver Referring Inge Waldroup: Treating Zayli Villafuerte/Extender: [name] Weeks in Treatment: 69 Active Problems Location of Pain Severity and Description of Pain Patient Has Paino No Site Locations Duration of the Pain. Constant / Intermittento Constant Rate the pain. Current Pain Level: 5 Worst Pain Level: 9 Least Pain Level: 3 Character of Pain Describe the Pain: Other: 'It just hurts" Pain Management and Medication Current Pain Management: Medication: Yes Cold Application: No Rest: Yes Massage: No Activity: No T.E.N.S.: No Heat Application: No Leg drop or elevation: No Is the Current Pain Management Adequate: Inadequate How does your wound impact your activities of daily livingo Sleep: No Bathing: No Appetite: No Relationship With Others: No Portillo, [name] ([id number]) [id number] Page 8 of 14 Bladder Continence: No Emotions: No Bowel Continence: No Work: No Toileting: No Drive: No Dressing: No Hobbies: No Electronic Signature(s) Signed: 06/03/2022 10:21:17 AM By: Massie Kluver Signed: 06/03/2022 11:32:10 AM By: Gretta Cool, BSN, RN, CWS, Kim RN, BSN Entered By: Massie Kluver on 06/02/2022 12:58:13 -------------------------------------------------------------------------------- Patient/Caregiver Education Details Patient Name: Date of Service: [name] 12/7/2023andnbsp12:45 PM Medical Record Number: [id number] Patient Account Number: [id number] Date of Birth/Gender: Treating RN: 1956/03/31 (66 y.o. [name] Primary Care Physician:  [name] Other Clinician: Massie Kluver Referring Physician: Treating Physician/Extender: [name] Weeks in Treatment: 39 Education Assessment Education Provided To: Patient Education Topics Provided Wound/Skin Impairment: Handouts: Other: continue wound care as directed Methods: Explain/Verbal Responses: State content correctly Electronic Signature(s) Signed: 06/03/2022 10:21:17 AM By: Massie Kluver Entered By: Massie Kluver on 06/02/2022 16:33:35 -------------------------------------------------------------------------------- Wound Assessment Details Patient Name: Date of Service: [name] 06/02/2022 12:45 PM Medical Record Number: [id number] Patient Account Number: [id number] Date of Birth/Sex: Treating RN: 24-Apr-1956 (66 y.o. [name] Primary Care Tomiko Schoon: [name] Other Clinician: Massie Kluver Referring Devarion Mcclanahan: Treating Cornell Bourbon/Extender: [name] Weeks in Treatment: [address] AMAURA, AUTHIER ([id number]) [id number] Page 9 of 14 Wound Number: 60 Primary Pressure Ulcer Etiology: Wound Location: Right, Circumferential Lower Leg Wound Open Wounding Event: Pressure Injury Status: Date Acquired: 05/17/2021 Comorbid Lymphedema, Hypertension, Peripheral Arterial Disease, Peripheral Weeks Of Treatment: 52 History: Venous Disease, Type II Diabetes, End Stage Renal Disease, Clustered Wound: No Osteoarthritis, Neuropathy Photos Wound Measurements Length: (cm) 20 Width: (cm) 17 Depth: (cm) 0.1 Area: (cm) 267.035 Volume: (cm) 26.704 % Reduction in Area: -47162.8% % Reduction in Volume: -46749.1% Epithelialization: Small (1-33%) Tunneling: No Undermining: No Wound Description Classification: Category/Stage III Exudate Amount: Large Exudate Type: Sanguinous Exudate Color: red Foul Odor After Cleansing: No Slough/Fibrino Yes Wound Bed Granulation Amount: Small  (1-33%) Granulation Quality: Red Necrotic Amount: Small (1-33%) Necrotic Quality: Adherent Slough Treatment Notes Wound #60 (Lower Leg) Wound Laterality: Right, Circumferential Cleanser Normal Saline Discharge Instruction: Wash your hands with soap and water. Remove old dressing, discard into plastic bag and place into trash. Cleanse  the wound with Normal Saline prior to applying a clean dressing using gauze sponges, not tissues or cotton balls. Do not scrub or use excessive force. Pat dry using gauze sponges, not tissue or cotton balls. Peri-Wound Care Topical Primary Dressing Xeroform 5x9-HBD (in/in) Discharge Instruction: Apply Xeroform 5x9-HBD (in/in) as directed Secondary Dressing ABD Pad 5x9 (in/in) Discharge Instruction: 2. Cover with ABD pad Secured With ACE WRAP - 56M ACE Elastic Bandage With VELCRO Brand Closure, 4 (in) Discharge Instruction: 4. Ace wrap to secure dressing in place and control swelling. Kerlix Roll Sterile or Non-Sterile 6-ply 4.5x4 (yd/yd) Discharge Instruction: 3. Apply Kerlix as directed Compression Wrap Compression Stockings Add-Ons [name], [name] ([id number]) [id number] Page 10 of 14 Electronic Signature(s) Signed: 06/03/2022 10:21:17 AM By: Massie Kluver Signed: 06/03/2022 11:32:10 AM By: Gretta Cool, BSN, RN, CWS, Kim RN, BSN Entered By: Massie Kluver on 06/02/2022 13:20:14 -------------------------------------------------------------------------------- Wound Assessment Details Patient Name: Date of Service: [name] 06/02/2022 12:45 PM Medical Record Number: [id number] Patient Account Number: [id number] Date of Birth/Sex: Treating RN: [birth year] (66 y.o. [name], Kim Primary Care Elissa Grieshop: [name] Other Clinician: Massie Kluver Referring Hardy Harcum: Treating Dillyn Menna/Extender: [name] Weeks in Treatment: 52 Wound Status Wound Number: 61 Primary Lymphedema Etiology: Wound Location:  Left, Posterior Lower Leg Wound Open Wounding Event: Gradually Appeared Status: Date Acquired: 06/11/2021 Comorbid Lymphedema, Hypertension, Peripheral Arterial Disease, Peripheral Weeks Of Treatment: 48 History: Venous Disease, Type II Diabetes, End Stage Renal Disease, Clustered Wound: No Osteoarthritis, Neuropathy Photos Wound Measurements Length: (cm) 1.5 Width: (cm) 1 Depth: (cm) 0.1 Area: (cm) 1.178 Volume: (cm) 0.118 % Reduction in Area: 96.3% % Reduction in Volume: 96.3% Epithelialization: None Wound Description Classification: Full Thickness With Exposed Suppo Exudate Amount: Large Exudate Type: Serosanguineous Exudate Color: red, brown rt Structures Foul Odor After Cleansing: No Slough/Fibrino Yes Wound Bed Granulation Amount: Medium (34-66%) Exposed Structure Granulation Quality: Red Fat Layer (Subcutaneous Tissue) Exposed: Yes Necrotic Amount: Small (1-33%) Necrotic Quality: Adherent Slough Treatment Notes Wound #61 (Lower Leg) Wound Laterality: Left, Posterior [name], [name] ([id number]) [id number] Page 11 of 14 Cleanser Normal Saline Discharge Instruction: Wash your hands with soap and water. Remove old dressing, discard into plastic bag and place into trash. Cleanse the wound with Normal Saline prior to applying a clean dressing using gauze sponges, not tissues or cotton balls. Do not scrub or use excessive force. Pat dry using gauze sponges, not tissue or cotton balls. Peri-Wound Care Topical Primary Dressing Xeroform 5x9-HBD (in/in) Discharge Instruction: Apply Xeroform 5x9-HBD (in/in) as directed Secondary Dressing ABD Pad 5x9 (in/in) Discharge Instruction: 2. Cover with ABD pad Secured With ACE WRAP - 56M ACE Elastic Bandage With VELCRO Brand Closure, 4 (in) Discharge Instruction: 4. Ace wrap to secure dressing in place and control swelling. Kerlix Roll Sterile or Non-Sterile 6-ply 4.5x4 (yd/yd) Discharge  Instruction: 3. Apply Kerlix as directed Compression Wrap Compression Stockings Add-Ons Electronic Signature(s) Signed: 06/03/2022 10:21:17 AM By: Massie Kluver Signed: 06/03/2022 11:32:10 AM By: Gretta Cool, BSN, RN, CWS, Kim RN, BSN Entered By: Massie Kluver on 06/02/2022 13:20:55 -------------------------------------------------------------------------------- Wound Assessment Details Patient Name: Date of Service: [name] 06/02/2022 12:45 PM Medical Record Number: [id number] Patient Account Number: [id number] Date of Birth/Sex: Treating RN: [birth year] (66 y.o. [name] Primary Care Orly Quimby: [name] Other Clinician: Massie Kluver Referring Cythnia Osmun: Treating Lorita Forinash/Extender: [name] Weeks in Treatment: 52 Wound Status Wound Number: 62 Primary Lymphedema Etiology: Wound Location: Left, Lateral Lower Leg Wound Open Wounding Event: Gradually Appeared  Status: Date Acquired: 06/11/2021 Comorbid Lymphedema, Hypertension, Peripheral Arterial Disease, Peripheral Weeks Of Treatment: 48 History: Venous Disease, Type II Diabetes, End Stage Renal Disease, Clustered Wound: No Osteoarthritis, Neuropathy Photos [name], ROSMAN ([id number]) [id number] Page 12 of 14 Wound Measurements Length: (cm) 19 Width: (cm) 9 Depth: (cm) 0.1 Area: (cm) 134.303 Volume: (cm) 13.43 % Reduction in Area: -14264% % Reduction in Volume: -14340.9% Epithelialization: None Wound Description Classification: Full Thickness Without Exposed Suppor Exudate Amount: Large Exudate Type: Sanguinous Exudate Color: red t Structures Wound Bed Granulation Amount: Small (1-33%) Exposed Structure Granulation Quality: Red, Pale Fat Layer (Subcutaneous Tissue) Exposed: Yes Necrotic Amount: Small (1-33%) Necrotic Quality: Adherent Slough Treatment Notes Wound #62 (Lower Leg) Wound Laterality: Left, Lateral Cleanser Normal Saline Discharge  Instruction: Wash your hands with soap and water. Remove old dressing, discard into plastic bag and place into trash. Cleanse the wound with Normal Saline prior to applying a clean dressing using gauze sponges, not tissues or cotton balls. Do not scrub or use excessive force. Pat dry using gauze sponges, not tissue or cotton balls. Peri-Wound Care Topical Primary Dressing Xeroform 5x9-HBD (in/in) Discharge Instruction: Apply Xeroform 5x9-HBD (in/in) as directed Secondary Dressing ABD Pad 5x9 (in/in) Discharge Instruction: 2. Cover with ABD pad Secured With ACE WRAP - 51M ACE Elastic Bandage With VELCRO Brand Closure, 4 (in) Discharge Instruction: 4. Ace wrap to secure dressing in place and control swelling. Kerlix Roll Sterile or Non-Sterile 6-ply 4.5x4 (yd/yd) Discharge Instruction: 3. Apply Kerlix as directed Compression Wrap Compression Stockings Add-Ons Electronic Signature(s) Signed: 06/03/2022 10:21:17 AM By: Massie Kluver Signed: 06/03/2022 11:32:10 AM By: Gretta Cool, BSN, RN, CWS, Kim RN, BSN Entered By: Massie Kluver on 06/02/2022 13:21:31 [name] ([id number]) [id number] Page 13 of 14 -------------------------------------------------------------------------------- Wound Assessment Details Patient Name: Date of Service: [name], [name] 06/02/2022 12:45 PM Medical Record Number: [id number] Patient Account Number: [id number] Date of Birth/Sex: Treating RN: Oct 15, 1955 (66 y.o. [name], Kim Primary Care Michaeal Davis: [name] Other Clinician: Massie Kluver Referring Brendy Ficek: Treating Igor Bishop/Extender: [name] Weeks in Treatment: 52 Wound Status Wound Number: [id number] Primary Pressure Ulcer Etiology: Wound Location: Right Calcaneus Wound Open Wounding Event: Pressure Injury Status: Date Acquired: 01/13/2022 Comorbid Lymphedema, Hypertension, Peripheral Arterial Disease, Peripheral Weeks Of Treatment: 17 History: Venous  Disease, Type II Diabetes, End Stage Renal Disease, Clustered Wound: No Osteoarthritis, Neuropathy Photos Wound Measurements Length: (cm) 1 Width: (cm) 1.5 Depth: (cm) 0.1 Area: (cm) 1.178 Volume: (cm) 0.118 % Reduction in Area: 50% % Reduction in Volume: 50% Wound Description Classification: Category/Stage II Exudate Amount: Medium Exudate Type: Serosanguineous Exudate Color: red, brown Treatment Notes Wound #67 (Calcaneus) Wound Laterality: Right Cleanser Normal Saline Discharge Instruction: Wash your hands with soap and water. Remove old dressing, discard into plastic bag and place into trash. Cleanse the wound with Normal Saline prior to applying a clean dressing using gauze sponges, not tissues or cotton balls. Do not scrub or use excessive force. Pat dry using gauze sponges, not tissue or cotton balls. Peri-Wound Care Topical Primary Dressing Xeroform 5x9-HBD (in/in) Discharge Instruction: Apply Xeroform 5x9-HBD (in/in) as directed Secondary Dressing RAMIAH, HELFRICH ([id number]) [id number] Page 14 of 14 ABD Pad 5x9 (in/in) Discharge Instruction: 2. Cover with ABD pad Secured With ACE WRAP - 51M ACE Elastic Bandage With VELCRO Brand Closure, 4 (in) Discharge Instruction: 4. Ace wrap to secure dressing in place and control swelling. Kerlix Roll Sterile or Non-Sterile 6-ply 4.5x4 (yd/yd) Discharge Instruction: 3. Apply Kerlix as directed Compression Wrap Compression  Stockings Environmental education officer) Signed: 06/03/2022 10:21:17 AM By: Massie Kluver Signed: 06/03/2022 11:32:10 AM By: Gretta Cool, BSN, RN, CWS, Kim RN, BSN Entered By: Massie Kluver on 06/02/2022 13:21:57 -------------------------------------------------------------------------------- Vitals Details Patient Name: Date of Service: [name]. 06/02/2022 12:45 PM Medical Record Number: [id number] Patient Account Number: [id number] Date of Birth/Sex: Treating  RN: 11-20-1955 (66 y.o. [name], Kim Primary Care Vannak Montenegro: [name] Other Clinician: Massie Kluver Referring Brittainy Bucker: Treating Tamyra Fojtik/Extender: [name] Weeks in Treatment: 52 Vital Signs Time Taken: 12:54 Temperature (F): 98.4 Height (in): 65 Pulse (bpm): 93 Weight (lbs): 245.4 Respiratory Rate (breaths/min): 16 Body Mass Index (BMI): 40.8 Blood Pressure (mmHg): 149/77 Reference Range: 80 - 120 mg / dl Electronic Signature(s) Signed: 06/03/2022 10:21:17 AM By: Massie Kluver Entered By: Massie Kluver on 06/02/2022 12:58:06

## 2022-06-02 NOTE — Telephone Encounter (Signed)
Copied from [place] [id number]. Topic: Medicare AWV >> Jun 02, 2022 10:02 AM Devoria Glassing wrote: Reason for CRM: Attempted to schedule AWV. Unable to LVM.  Will try at later time. Mailbox full

## 2022-06-03 NOTE — Progress Notes (Addendum)
[name], Autumn ([id number]) [id number] Page 1 of 10 Visit Report for 06/02/2022 Chief Complaint Document Details Patient Name: Date of Service: [name], [name] 06/02/2022 12:45 PM Medical Record Number: [id number] Patient Account Number: [id number] Date of Birth/Sex: Treating RN: 1956-01-13 (66 y.o. 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 New sacral wound as of 10/29/21 Electronic Signature(s) Signed: 06/02/2022 12:53:21 PM By: Worthy Keeler PA-C Entered By: Worthy Keeler on 06/02/2022 12:53:21 -------------------------------------------------------------------------------- HPI Details Patient Name: Date of Service: [name]. 06/02/2022 12:45 PM Medical Record Number: [id number] Patient Account Number: [id number] Date of Birth/Sex: Treating RN: May 07, 1956 (66 y.o. [name] Primary Care Provider: [name] Other Clinician: Massie Kluver Referring Provider: Treating Provider/Extender: [name] Weeks in Treatment: 60 History of Present Illness HPI Description: 66yo w/ h/o BLE phlebolymphedema, type 2 DM (unknown hemoglobin A1c), and obesity. No PVD. h/o chronic, recurrent BLE calf ulcers. Treated with 4 layer compression. Infrequently uses lymphedema pump. Bilateral lower extremity ulcerations healed as of June 2016. Fitted for custom compression stockings but did not receive them. Patient could not travel for lymphedema consult. She developed recurrent bilateral lower extremity ulcerations in August 2016. She is able to heal these quite quickly with 4 layer elastic compression bandages. However, she is not very compliant using her juxtalite compression  garments and tends to develop recurrent ulcerations while using juxtalite. Unna boots were applied this past week. She tolerated this well but performs minimal ambulation. She is scheduled to undergo total hip replacement tomorrow with Dr. Rudene Christians. She is unsure if he is aware of her ulcerations. She is otherwise without complaints. No significant pain. No fever or chills. Moderate clear drainage. Not on any antibiotics. 06/04/2015 -- she did have her hip surgery and has now been in rehabilitation and they're controlling her fluid intake and diuretics to the extent that she has lost 13 pounds of weight and her lower extremity circumference is decreased by 7 cm. 06/18/2015 -- she is still in the rehabilitation facility and continues to be looked after well and has lost a total of 15 pounds and her diabetes is under much better control. [name], Autumn ([id number]) [id number] Page 2 of 10 Readmission: 06/03/2021 upon evaluation today patient actually appears to be doing somewhat poorly in regard to a wound on the right posterior lower leg. Patient does have a history of previously having been seen here in the clinic but this predates my time here back in 2017. Subsequently unfortunately right now she is having a significant issue with bleeding in the posterior of her right calf this appears to be a deep tissue injury as developed into an ulceration. She does have a history significant for lymphedema, diabetes mellitus type 2, diabetic neuropathy, hypertension, hypothyroidism, and obesity. She is currently in a skilled nursing facility. The dressing that she had in place during evaluation today was Xeroform followed by significant amount of gauze and this was severely stuck. Upon removal the patient was having significant bleeding she also had significant work done in the hospital in regard to her blood flow and Dr. Lucky Cowboy does look like he has her on 2 blood thinners Plavix and  Lovenox. This is probably part of the reason why she is bleeding so significantly. With that being said I do believe that based  on what we are seeing currently this is probably can to be something that were getting have to try to get stopped with more than just pressure to the region currently. I think surgery foam is probably can to be necessary. If we can get the stop of the Surgifoam she is probably can have to go to the hospital to try to get this bleeding stopped. 12/30; this is a patient who is currently in a skilled facility. She has wounds on her bilateral calfs likely secondary to severe chronic venous insufficiency and lymphedema the care of these wounds is complicated by the fact that she is on Lovenox and Plavix and bleeds very frequently with even minimal trauma to these wounds. It would appear that she had a Vaseline gauze on the surface of these in the process of undressing them she bled freely from especially the right but also the left. I had to go in and use silver nitrate. The skin on her legs is significant for chronic venous stasis and lymphedema. Very dry flaking skin 07/16/2021; I actually was a patent person who saw this woman 3 weeks ago. She is a nursing facility has chronic venous wounds and bilateral lower extremity wounds. We use Xeroform under compression. The time she was on Lovenox and Plavix I thought I had an excellent reason for these wounds to bleed so freely. Since then the Lovenox has been stopped and should be well out of her system but she is still on Plavix. She still has very friable bleeding wounds on the left lateral left Posterior and right Posterior lower extremity.These believe very freely when we take off the dressing Also she has a left heel wound which when we saw her last time was a surgical wound however I gather we have been asked to follow this as well. I am not exactly sure at this time what her surgery was but she comes in with a wound VAC on this  and they are apparently changing it at the nursing home. 08/27/2021 upon evaluation patient appears to be doing better in regard to her wound on the legs as well as the heel. Fortunately at this point I do not see any need for a wound VAC which is good news overall I think that the Xeroform is probably a good option that is what is being utilized here as well. Fortunately I do not see any signs of active infection locally nor systemically which is great news and overall I think that the patient is making excellent progress. The area on her right gluteal area actually appears to be doing quite well and I am pleased with that. I do believe the silver alginate dressing here could be of benefit for her. 09/17/2021 upon evaluation patient appears to be doing actually better in regard to her wounds on the legs. Fortunately she does not seem to be showing any signs of worsening which is great news and overall very pleased with where we stand. I do believe that she is tolerating the dressing changes without complication which is great news. 10-08-2021 upon evaluation today patient unfortunately continues to have significant issues with her legs. Unfortunately the biggest issue that we see Currently is that she is still having a lot of bleeding from her legs that she is also not able to move well due to her knee pain her left knee is quite significant as far as the discomfort is concerned and the right knee is getting equally as bad. Fortunately I do  not see any evidence of active infection locally or systemically which is great news. Nonetheless I do believe that her heels are looking much better there is a tiny area open on the left heel the right is doing quite well. In regard to the gluteal/sacral region this appears to be healed. 10-29-2021 upon evaluation today patient unfortunately is appearing to do much worse compared to normal. She has more breakdown in regard to the left lateral leg and again I think this  is pressure related. She also has more breakdown in regard to the gluteal area and she is very concerned about this in particular which is completely understandable. With that being said I do believe that she would benefit from more aggressive and appropriate offloading I discussed with her that she needs to allow the facility to change her position every 2 hours and she is can have to find different positions other than just lying on her back. Also discussed with her that obviously the facility needs to make sure to help her with repositioning every 2 hours I think both aspects of this are important. Otherwise her wound on the sacral area is going to continue to get worse and the leg will continue to get worse. 11-26-2021 upon evaluation today patient appears to be doing well with regard to her wounds all things considered. The gluteal areas are all healed which is great news. With that being said she is still having some issues here with her lower extremities. I do think that this is still something that we need to be very mindful of. Unfortunately the facility is still not washing her legs she has a lot of caked on dry skin with Xeroform we will get a try to get some of that off today. With that being said he does have reiterated in the past the patient needs to have her legs washed regularly. 12-24-2021 upon evaluation patient's wounds are showing signs of improvement albeit slowly. Fortunately I do not see any signs of infection locally or systemically which is great news. Nonetheless I do believe that the patient is doing quite well from the standpoint of the overall appearance of her wounds which are better than where they have been even if not where we would like them to be. 02-03-2022 upon evaluation today patient appears to be doing about the same in regard to her wounds. There is maybe a little bit of improvement in general based on what I am seeing currently but not much to be honest. I do think  that the patient is making some progress here when it comes to the overall appearance of her wounds. 04-25-2022 upon evaluation patient actually is here for the first time in about 2-1/2 months. She has had multiple reasons why she did not make it part of the time she was sick and some of the times it was more of a transportation thing. Nonetheless I am glad that she is finally here good news is some of the wounds do appear to be a little bit drier and she has been using a alginate dressing at the facility which they switched her to honestly I cannot argue with the fact that it seems to be doing somewhat better in my opinion. With that being said I do believe one of the biggest issues that I see is simply that she is still having some issues here with pressure although I do not see as much of the dark discoloration as we previously noted she has been  using the Prevalon offloading boots which is excellent. I want her to keep using that as I think that still good to be ideal and optimal for her. She does inquire about being able to put pressure on her heels at this point. 06-02-2022 upon evaluation today patient appears to be unfortunately still having a lot of issues with her legs. Were not seeing a whole lot of improvement she is not as bad as she has been at some point in time but she is also not as good as we would like to see. Electronic Signature(s) Signed: 06/02/2022 1:43:20 PM By: Worthy Keeler PA-C Previous Signature: 06/02/2022 1:06:05 PM Version By: Worthy Keeler PA-C Entered By: Worthy Keeler on 06/02/2022 13:43:20 [name] ([id number]) [id number] Page 3 of 10 -------------------------------------------------------------------------------- Physical Exam Details Patient Name: Date of Service: ZAIYA, ANNUNZIATO 06/02/2022 12:45 PM Medical Record Number: [id number] Patient Account Number: [id number] Date of Birth/Sex: Treating RN: [birth year] (66  y.o. [name] Primary Care Provider: [name] Other Clinician: Massie Kluver Referring Provider: Treating Provider/Extender: [name] Weeks in Treatment: 78 Constitutional Obese and well-hydrated in no acute distress. Respiratory normal breathing without difficulty. Psychiatric this patient is able to make decisions and demonstrates good insight into disease process. Alert and Oriented x 3. pleasant and cooperative. Notes Upon evaluation patient's wounds are actually showing signs of still being broken down on the back of her legs. Fortunately I do not see any signs of active infection at this time. No fevers, chills, nausea, vomiting, or diarrhea. Electronic Signature(s) Signed: 06/02/2022 1:44:03 PM By: Worthy Keeler PA-C Previous Signature: 06/02/2022 1:06:39 PM Version By: Worthy Keeler PA-C Previous Signature: 06/02/2022 1:06:19 PM Version By: Worthy Keeler PA-C Entered By: Worthy Keeler on 06/02/2022 13:44:03 -------------------------------------------------------------------------------- Physician Orders Details Patient Name: Date of Service: [name] 06/02/2022 12:45 PM Medical Record Number: [id number] Patient Account Number: [id number] Date of Birth/Sex: Treating RN: 06-08-1956 (66 y.o. [name], Kim Primary Care Provider: [name] Other Clinician: Massie Kluver Referring Provider: Treating Provider/Extender: [name] Weeks in Treatment: 82 Verbal / Phone Orders: No Diagnosis Coding ICD-10 Coding Code Description I89.0 Lymphedema, not elsewhere classified E11.622 Type 2 diabetes mellitus with other skin ulcer E11.40 Type 2 diabetes mellitus with diabetic neuropathy, unspecified L89.893 Pressure ulcer of other site, stage 3 L89.896 Pressure-induced deep tissue damage of other site L89.153 Pressure ulcer of sacral region, stage 3 Janik, Jaclin K ([id number]) [id number]  Page 4 of 10 I10 Essential (primary) hypertension E03.8 Other specified hypothyroidism E66.09 Other obesity due to excess calories Follow-up Appointments Wound #60 Right,Circumferential Lower Leg Return Appointment in 1 month Bathing/ Shower/ Hygiene Wash wounds with antibacterial soap and water. - WASH LEGS WITH SOAP and WATER before applying dressings. No tub bath. Edema Control - Lymphedema / Segmental Compressive Device / Other Other: - Lymphedema-use ace wraps to help control swelling. Off-Loading Turn and reposition every 2 hours - PLEASE MAKE SURE THIS IS COMPLETED EVERY TWO HOURS THANK YOU Other: - Put pillows under knees to keep pressure off of calves. Deep tissue injury on posterior calves. Turn on side every 1 hous to keep pressure off calves wear offloading boots Additional Orders / Instructions Follow Nutritious Diet and Increase Protein Intake Wound Treatment Wound #60 - Lower Leg Wound Laterality: Right, Circumferential Cleanser: Normal Saline 3 x Per Week/30 Days Discharge Instructions: Wash your hands with soap and water. Remove old dressing, discard into plastic bag and  place into trash. Cleanse the wound with Normal Saline prior to applying a clean dressing using gauze sponges, not tissues or cotton balls. Do not scrub or use excessive force. Pat dry using gauze sponges, not tissue or cotton balls. Prim Dressing: Xeroform 5x9-HBD (in/in) 3 x Per Week/30 Days ary Discharge Instructions: Apply Xeroform 5x9-HBD (in/in) as directed Secondary Dressing: ABD Pad 5x9 (in/in) 3 x Per Week/30 Days Discharge Instructions: 2. Cover with ABD pad Secured With: ACE WRAP - 34M ACE Elastic Bandage With VELCRO Brand Closure, 4 (in) 3 x Per Week/30 Days Discharge Instructions: 4. Ace wrap to secure dressing in place and control swelling. Secured With: [employer] or Non-Sterile 6-ply 4.5x4 (yd/yd) 3 x Per Week/30 Days Discharge Instructions: 3. Apply Kerlix as directed Wound  #61 - Lower Leg Wound Laterality: Left, Posterior Cleanser: Normal Saline 3 x Per Week/30 Days Discharge Instructions: Wash your hands with soap and water. Remove old dressing, discard into plastic bag and place into trash. Cleanse the wound with Normal Saline prior to applying a clean dressing using gauze sponges, not tissues or cotton balls. Do not scrub or use excessive force. Pat dry using gauze sponges, not tissue or cotton balls. Prim Dressing: Xeroform 5x9-HBD (in/in) 3 x Per Week/30 Days ary Discharge Instructions: Apply Xeroform 5x9-HBD (in/in) as directed Secondary Dressing: ABD Pad 5x9 (in/in) 3 x Per Week/30 Days Discharge Instructions: 2. Cover with ABD pad Secured With: ACE WRAP - 34M ACE Elastic Bandage With VELCRO Brand Closure, 4 (in) 3 x Per Week/30 Days Discharge Instructions: 4. Ace wrap to secure dressing in place and control swelling. Secured With: [employer] or Non-Sterile 6-ply 4.5x4 (yd/yd) 3 x Per Week/30 Days Discharge Instructions: 3. Apply Kerlix as directed Wound #62 - Lower Leg Wound Laterality: Left, Lateral Cleanser: Normal Saline 3 x Per Week/30 Days Discharge Instructions: Wash your hands with soap and water. Remove old dressing, discard into plastic bag and place into trash. Cleanse the wound with Normal Saline prior to applying a clean dressing using gauze sponges, not tissues or cotton balls. Do not scrub or use excessive force. Pat dry using gauze sponges, not tissue or cotton balls. Prim Dressing: Xeroform 5x9-HBD (in/in) 3 x Per Week/30 Days ary Discharge Instructions: Apply Xeroform 5x9-HBD (in/in) as directed Secondary Dressing: ABD Pad 5x9 (in/in) 3 x Per Week/30 Days Discharge Instructions: 2. Cover with ABD pad Secured With: ACE WRAP - 34M ACE Elastic Bandage With VELCRO Brand Closure, 4 (in) 3 x Per Week/30 Days Discharge Instructions: 4. Ace wrap to secure dressing in place and control swelling. Secured With: [employer] or  Non-Sterile 6-ply 4.5x4 (yd/yd) 3 x Per Week/30 Days CONGETTA, ODRISCOLL ([id number]) [id number] Page 5 of 10 Discharge Instructions: 3. Apply Kerlix as directed Wound #67 - Calcaneus Wound Laterality: Right Cleanser: Normal Saline 3 x Per Week/30 Days Discharge Instructions: Wash your hands with soap and water. Remove old dressing, discard into plastic bag and place into trash. Cleanse the wound with Normal Saline prior to applying a clean dressing using gauze sponges, not tissues or cotton balls. Do not scrub or use excessive force. Pat dry using gauze sponges, not tissue or cotton balls. Prim Dressing: Xeroform 5x9-HBD (in/in) 3 x Per Week/30 Days ary Discharge Instructions: Apply Xeroform 5x9-HBD (in/in) as directed Secondary Dressing: ABD Pad 5x9 (in/in) 3 x Per Week/30 Days Discharge Instructions: 2. Cover with ABD pad Secured With: ACE WRAP - 34M ACE Elastic Bandage With VELCRO Brand Closure, 4 (in) 3  x Per Week/30 Days Discharge Instructions: 4. Ace wrap to secure dressing in place and control swelling. Secured With: [employer] or Non-Sterile 6-ply 4.5x4 (yd/yd) 3 x Per Week/30 Days Discharge Instructions: 3. Apply Kerlix as directed Electronic Signature(s) Signed: 06/02/2022 4:35:46 PM By: Worthy Keeler PA-C Signed: 06/03/2022 10:21:17 AM By: Massie Kluver Entered By: Massie Kluver on 06/02/2022 13:32:35 -------------------------------------------------------------------------------- Problem List Details Patient Name: Date of Service: [name]. 06/02/2022 12:45 PM Medical Record Number: [id number] Patient Account Number: [id number] Date of Birth/Sex: Treating RN: Sep 24, 1955 (66 y.o. [name] Primary Care Provider: [name] Other Clinician: Massie Kluver Referring Provider: Treating Provider/Extender: [name] Weeks in Treatment: 71 Active Problems ICD-10 Encounter Code Description Active Date  MDM Diagnosis I89.0 Lymphedema, not elsewhere classified 06/03/2021 No Yes E11.622 Type 2 diabetes mellitus with other skin ulcer 06/03/2021 No Yes E11.40 Type 2 diabetes mellitus with diabetic neuropathy, unspecified 06/03/2021 No Yes L89.893 Pressure ulcer of other site, stage 3 06/03/2021 No Yes L89.896 Pressure-induced deep tissue damage of other site 06/03/2021 No Yes ADRIANNA, DUDAS ([id number]) [id number] Page 6 of 10 L89.153 Pressure ulcer of sacral region, stage 3 08/27/2021 No Yes I10 Essential (primary) hypertension 06/03/2021 No Yes E03.8 Other specified hypothyroidism 06/03/2021 No Yes E66.09 Other obesity due to excess calories 06/03/2021 No Yes Inactive Problems Resolved Problems Electronic Signature(s) Signed: 06/02/2022 12:53:17 PM By: Worthy Keeler PA-C Entered By: Worthy Keeler on 06/02/2022 12:53:17 -------------------------------------------------------------------------------- Progress Note Details Patient Name: Date of Service: [name]. 06/02/2022 12:45 PM Medical Record Number: [id number] Patient Account Number: [id number] Date of Birth/Sex: Treating RN: Nov 10, 1955 (66 y.o. 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 New sacral wound as of 10/29/21 History of Present Illness (HPI) 66yo w/ h/o BLE phlebolymphedema, type 2 DM (unknown hemoglobin A1c), and obesity. No PVD. h/o chronic, recurrent BLE calf ulcers. Treated with 4 layer compression. Infrequently uses lymphedema pump. Bilateral lower extremity ulcerations healed as of June 2016. Fitted for custom compression stockings but did not receive them. Patient could not travel for lymphedema consult. She  developed recurrent bilateral lower extremity ulcerations in August 2016. She is able to heal these quite quickly with 4 layer elastic compression bandages. However, she is not very compliant using her juxtalite compression garments and tends to develop recurrent ulcerations while using juxtalite. Unna boots were applied this past week. She tolerated this well but performs minimal ambulation. She is scheduled to undergo total hip replacement tomorrow with Dr. Rudene Christians. She is unsure if he is aware of her ulcerations. She is otherwise without complaints. No significant pain. No fever or chills. Moderate clear drainage. Not on any antibiotics. 06/04/2015 -- she did have her hip surgery and has now been in rehabilitation and they're controlling her fluid intake and diuretics to the extent that she has lost 13 pounds of weight and her lower extremity circumference is decreased by 7 cm. 06/18/2015 -- she is still in the rehabilitation facility and continues to be looked after well and has lost a total of 15 pounds and her diabetes is under much better control. Readmission: 06/03/2021 upon evaluation today patient actually appears to be doing somewhat poorly in regard to a wound on the right posterior lower leg. Patient does have a history of previously having been seen  here in the clinic but this predates my time here back in 2017. Subsequently unfortunately right now she is having a significant issue with bleeding in the posterior of her right calf this appears to be a deep tissue injury as developed into an ulceration. She does have a history significant for lymphedema, diabetes mellitus type 2, diabetic neuropathy, hypertension, hypothyroidism, and obesity. She is TARAANN, OLTHOFF ([id number]) [id number] Page 7 of 10 currently in a skilled nursing facility. The dressing that she had in place during evaluation today was Xeroform followed by significant amount of gauze and this  was severely stuck. Upon removal the patient was having significant bleeding she also had significant work done in the hospital in regard to her blood flow and Dr. Lucky Cowboy does look like he has her on 2 blood thinners Plavix and Lovenox. This is probably part of the reason why she is bleeding so significantly. With that being said I do believe that based on what we are seeing currently this is probably can to be something that were getting have to try to get stopped with more than just pressure to the region currently. I think surgery foam is probably can to be necessary. If we can get the stop of the Surgifoam she is probably can have to go to the hospital to try to get this bleeding stopped. 12/30; this is a patient who is currently in a skilled facility. She has wounds on her bilateral calfs likely secondary to severe chronic venous insufficiency and lymphedema the care of these wounds is complicated by the fact that she is on Lovenox and Plavix and bleeds very frequently with even minimal trauma to these wounds. It would appear that she had a Vaseline gauze on the surface of these in the process of undressing them she bled freely from especially the right but also the left. I had to go in and use silver nitrate. The skin on her legs is significant for chronic venous stasis and lymphedema. Very dry flaking skin 07/16/2021; I actually was a patent person who saw this woman 3 weeks ago. She is a nursing facility has chronic venous wounds and bilateral lower extremity wounds. We use Xeroform under compression. The time she was on Lovenox and Plavix I thought I had an excellent reason for these wounds to bleed so freely. Since then the Lovenox has been stopped and should be well out of her system but she is still on Plavix. She still has very friable bleeding wounds on the left lateral left Posterior and right Posterior lower extremity.These believe very freely when we take off the dressing Also she has a  left heel wound which when we saw her last time was a surgical wound however I gather we have been asked to follow this as well. I am not exactly sure at this time what her surgery was but she comes in with a wound VAC on this and they are apparently changing it at the nursing home. 08/27/2021 upon evaluation patient appears to be doing better in regard to her wound on the legs as well as the heel. Fortunately at this point I do not see any need for a wound VAC which is good news overall I think that the Xeroform is probably a good option that is what is being utilized here as well. Fortunately I do not see any signs of active infection locally nor systemically which is great news and overall I think that the patient is making excellent progress. The  area on her right gluteal area actually appears to be doing quite well and I am pleased with that. I do believe the silver alginate dressing here could be of benefit for her. 09/17/2021 upon evaluation patient appears to be doing actually better in regard to her wounds on the legs. Fortunately she does not seem to be showing any signs of worsening which is great news and overall very pleased with where we stand. I do believe that she is tolerating the dressing changes without complication which is great news. 10-08-2021 upon evaluation today patient unfortunately continues to have significant issues with her legs. Unfortunately the biggest issue that we see Currently is that she is still having a lot of bleeding from her legs that she is also not able to move well due to her knee pain her left knee is quite significant as far as the discomfort is concerned and the right knee is getting equally as bad. Fortunately I do not see any evidence of active infection locally or systemically which is great news. Nonetheless I do believe that her heels are looking much better there is a tiny area open on the left heel the right is doing quite well. In regard to the  gluteal/sacral region this appears to be healed. 10-29-2021 upon evaluation today patient unfortunately is appearing to do much worse compared to normal. She has more breakdown in regard to the left lateral leg and again I think this is pressure related. She also has more breakdown in regard to the gluteal area and she is very concerned about this in particular which is completely understandable. With that being said I do believe that she would benefit from more aggressive and appropriate offloading I discussed with her that she needs to allow the facility to change her position every 2 hours and she is can have to find different positions other than just lying on her back. Also discussed with her that obviously the facility needs to make sure to help her with repositioning every 2 hours I think both aspects of this are important. Otherwise her wound on the sacral area is going to continue to get worse and the leg will continue to get worse. 11-26-2021 upon evaluation today patient appears to be doing well with regard to her wounds all things considered. The gluteal areas are all healed which is great news. With that being said she is still having some issues here with her lower extremities. I do think that this is still something that we need to be very mindful of. Unfortunately the facility is still not washing her legs she has a lot of caked on dry skin with Xeroform we will get a try to get some of that off today. With that being said he does have reiterated in the past the patient needs to have her legs washed regularly. 12-24-2021 upon evaluation patient's wounds are showing signs of improvement albeit slowly. Fortunately I do not see any signs of infection locally or systemically which is great news. Nonetheless I do believe that the patient is doing quite well from the standpoint of the overall appearance of her wounds which are better than where they have been even if not where we would like them to  be. 02-03-2022 upon evaluation today patient appears to be doing about the same in regard to her wounds. There is maybe a little bit of improvement in general based on what I am seeing currently but not much to be honest. I do think that  the patient is making some progress here when it comes to the overall appearance of her wounds. 04-25-2022 upon evaluation patient actually is here for the first time in about 2-1/2 months. She has had multiple reasons why she did not make it part of the time she was sick and some of the times it was more of a transportation thing. Nonetheless I am glad that she is finally here good news is some of the wounds do appear to be a little bit drier and she has been using a alginate dressing at the facility which they switched her to honestly I cannot argue with the fact that it seems to be doing somewhat better in my opinion. With that being said I do believe one of the biggest issues that I see is simply that she is still having some issues here with pressure although I do not see as much of the dark discoloration as we previously noted she has been using the Prevalon offloading boots which is excellent. I want her to keep using that as I think that still good to be ideal and optimal for her. She does inquire about being able to put pressure on her heels at this point. 06-02-2022 upon evaluation today patient appears to be unfortunately still having a lot of issues with her legs. Were not seeing a whole lot of improvement she is not as bad as she has been at some point in time but she is also not as good as we would like to see. Objective Constitutional Obese and well-hydrated in no acute distress. Vitals Time Taken: 12:54 PM, Height: 65 in, Weight: 245.4 lbs, BMI: 40.8, Temperature: 98.4 F, Pulse: 93 bpm, Respiratory Rate: 16 breaths/min, Blood Pressure: 149/77 mmHg. Respiratory normal breathing without difficulty. Psychiatric this patient is able to make decisions  and demonstrates good insight into disease process. Alert and Oriented x 3. pleasant and cooperative. ZEN, FELLING ([id number]) [id number] Page 8 of 10 General Notes: Upon evaluation patient's wounds are actually showing signs of still being broken down on the back of her legs. Fortunately I do not see any signs of active infection at this time. No fevers, chills, nausea, vomiting, or diarrhea. Integumentary (Hair, Skin) Wound #60 status is Open. Original cause of wound was Pressure Injury. The date acquired was: 05/17/2021. The wound has been in treatment 52 weeks. The wound is located on the Right,Circumferential Lower Leg. The wound measures 20cm length x 17cm width x 0.1cm depth; 267.035cm^2 area and 26.704cm^3 volume. There is no tunneling or undermining noted. There is a large amount of sanguinous drainage noted. There is small (1-33%) red granulation within the wound bed. There is a small (1-33%) amount of necrotic tissue within the wound bed including Adherent Slough. Wound #61 status is Open. Original cause of wound was Gradually Appeared. The date acquired was: 06/11/2021. The wound has been in treatment 48 weeks. The wound is located on the Left,Posterior Lower Leg. The wound measures 1.5cm length x 1cm width x 0.1cm depth; 1.178cm^2 area and 0.118cm^3 volume. There is Fat Layer (Subcutaneous Tissue) exposed. There is a large amount of serosanguineous drainage noted. There is medium (34-66%) red granulation within the wound bed. There is a small (1-33%) amount of necrotic tissue within the wound bed including Adherent Slough. Wound #62 status is Open. Original cause of wound was Gradually Appeared. The date acquired was: 06/11/2021. The wound has been in treatment 48 weeks. The wound is located on the Left,Lateral Lower Leg. The wound measures  19cm length x 9cm width x 0.1cm depth; 134.303cm^2 area and 13.43cm^3 volume. There is Fat Layer (Subcutaneous  Tissue) exposed. There is a large amount of sanguinous drainage noted. There is small (1-33%) red, pale granulation within the wound bed. There is a small (1-33%) amount of necrotic tissue within the wound bed including Adherent Slough. Wound #67 status is Open. Original cause of wound was Pressure Injury. The date acquired was: 01/13/2022. The wound has been in treatment 17 weeks. The wound is located on the Right Calcaneus. The wound measures 1cm length x 1.5cm width x 0.1cm depth; 1.178cm^2 area and 0.118cm^3 volume. There is a medium amount of serosanguineous drainage noted. Assessment Active Problems ICD-10 Lymphedema, not elsewhere classified Type 2 diabetes mellitus with other skin ulcer Type 2 diabetes mellitus with diabetic neuropathy, unspecified Pressure ulcer of other site, stage 3 Pressure-induced deep tissue damage of other site Pressure ulcer of sacral region, stage 3 Essential (primary) hypertension Other specified hypothyroidism Other obesity due to excess calories Plan Follow-up Appointments: Wound #60 Right,Circumferential Lower Leg: Return Appointment in 1 month Bathing/ Shower/ Hygiene: Wash wounds with antibacterial soap and water. - WASH LEGS WITH SOAP and WATER before applying dressings. No tub bath. Edema Control - Lymphedema / Segmental Compressive Device / Other: Other: - Lymphedema-use ace wraps to help control swelling. Off-Loading: Turn and reposition every 2 hours - PLEASE MAKE SURE THIS IS COMPLETED EVERY TWO HOURS THANK YOU Other: - Put pillows under knees to keep pressure off of calves. Deep tissue injury on posterior calves. Turn on side every 1 hous to keep pressure off calves wear offloading boots Additional Orders / Instructions: Follow Nutritious Diet and Increase Protein Intake WOUND #60: - Lower Leg Wound Laterality: Right, Circumferential Cleanser: Normal Saline 3 x Per Week/30 Days Discharge Instructions: Wash your hands with soap and water.  Remove old dressing, discard into plastic bag and place into trash. Cleanse the wound with Normal Saline prior to applying a clean dressing using gauze sponges, not tissues or cotton balls. Do not scrub or use excessive force. Pat dry using gauze sponges, not tissue or cotton balls. Prim Dressing: Xeroform 5x9-HBD (in/in) 3 x Per Week/30 Days ary Discharge Instructions: Apply Xeroform 5x9-HBD (in/in) as directed Secondary Dressing: ABD Pad 5x9 (in/in) 3 x Per Week/30 Days Discharge Instructions: 2. Cover with ABD pad Secured With: ACE WRAP - 69M ACE Elastic Bandage With VELCRO Brand Closure, 4 (in) 3 x Per Week/30 Days Discharge Instructions: 4. Ace wrap to secure dressing in place and control swelling. Secured With: [employer] or Non-Sterile 6-ply 4.5x4 (yd/yd) 3 x Per Week/30 Days Discharge Instructions: 3. Apply Kerlix as directed WOUND #61: - Lower Leg Wound Laterality: Left, Posterior Cleanser: Normal Saline 3 x Per Week/30 Days Discharge Instructions: Wash your hands with soap and water. Remove old dressing, discard into plastic bag and place into trash. Cleanse the wound with Normal Saline prior to applying a clean dressing using gauze sponges, not tissues or cotton balls. Do not scrub or use excessive force. Pat dry using gauze sponges, not tissue or cotton balls. Prim Dressing: Xeroform 5x9-HBD (in/in) 3 x Per Week/30 Days ary Discharge Instructions: Apply Xeroform 5x9-HBD (in/in) as directed Secondary Dressing: ABD Pad 5x9 (in/in) 3 x Per Week/30 Days Discharge Instructions: 2. Cover with ABD pad Secured With: ACE WRAP - 69M ACE Elastic Bandage With VELCRO Brand Closure, 4 (in) 3 x Per Week/30 Days Discharge Instructions: 4. Ace wrap to secure dressing in place and control swelling.  Secured With: [employer] or Non-Sterile 6-ply 4.5x4 (yd/yd) 3 x Per Week/30 Days Discharge Instructions: 3. Apply Kerlix as directed WHITTANY, PARISH ([id number])  [id number] Page 9 of 10 WOUND #62: - Lower Leg Wound Laterality: Left, Lateral Cleanser: Normal Saline 3 x Per Week/30 Days Discharge Instructions: Wash your hands with soap and water. Remove old dressing, discard into plastic bag and place into trash. Cleanse the wound with Normal Saline prior to applying a clean dressing using gauze sponges, not tissues or cotton balls. Do not scrub or use excessive force. Pat dry using gauze sponges, not tissue or cotton balls. Prim Dressing: Xeroform 5x9-HBD (in/in) 3 x Per Week/30 Days ary Discharge Instructions: Apply Xeroform 5x9-HBD (in/in) as directed Secondary Dressing: ABD Pad 5x9 (in/in) 3 x Per Week/30 Days Discharge Instructions: 2. Cover with ABD pad Secured With: ACE WRAP - 61M ACE Elastic Bandage With VELCRO Brand Closure, 4 (in) 3 x Per Week/30 Days Discharge Instructions: 4. Ace wrap to secure dressing in place and control swelling. Secured With: [employer] or Non-Sterile 6-ply 4.5x4 (yd/yd) 3 x Per Week/30 Days Discharge Instructions: 3. Apply Kerlix as directed WOUND #67: - Calcaneus Wound Laterality: Right Cleanser: Normal Saline 3 x Per Week/30 Days Discharge Instructions: Wash your hands with soap and water. Remove old dressing, discard into plastic bag and place into trash. Cleanse the wound with Normal Saline prior to applying a clean dressing using gauze sponges, not tissues or cotton balls. Do not scrub or use excessive force. Pat dry using gauze sponges, not tissue or cotton balls. Prim Dressing: Xeroform 5x9-HBD (in/in) 3 x Per Week/30 Days ary Discharge Instructions: Apply Xeroform 5x9-HBD (in/in) as directed Secondary Dressing: ABD Pad 5x9 (in/in) 3 x Per Week/30 Days Discharge Instructions: 2. Cover with ABD pad Secured With: ACE WRAP - 61M ACE Elastic Bandage With VELCRO Brand Closure, 4 (in) 3 x Per Week/30 Days Discharge Instructions: 4. Ace wrap to secure dressing in place and control  swelling. Secured With: [employer] or Non-Sterile 6-ply 4.5x4 (yd/yd) 3 x Per Week/30 Days Discharge Instructions: 3. Apply Kerlix as directed 1. Based on what I see I do believe that the patient would benefit from a continuation of therapy currently with the Xeroform gauze the silver alginate just is getting way too stuck to her skin and wounds and this is caused a lot of bleeding and discomfort as well. 2. I am also can recommend that we have the patient continue to monitor for any signs of infection or worsening in general. Obviously if anything changes she knows she can contact the office let me know otherwise we can see where things stand at follow-up. We will see patient back for reevaluation in 2 weeks here in the clinic. If anything worsens or changes patient will contact our office for additional recommendations. Electronic Signature(s) Signed: 06/02/2022 1:45:10 PM By: Worthy Keeler PA-C Entered By: Worthy Keeler on 06/02/2022 13:45:10 -------------------------------------------------------------------------------- SuperBill Details Patient Name: Date of Service: [name] 06/02/2022 Medical Record Number: [id number] Patient Account Number: [id number] Date of Birth/Sex: Treating RN: 18-Feb-1956 (66 y.o. [name], Kim Primary Care Provider: [name] Other Clinician: Massie Kluver Referring Provider: Treating Provider/Extender: [name] Weeks in Treatment: 52 Diagnosis Coding ICD-10 Codes Code Description I89.0 Lymphedema, not elsewhere classified E11.622 Type 2 diabetes mellitus with other skin ulcer E11.40 Type 2 diabetes mellitus with diabetic neuropathy, unspecified L89.893 Pressure ulcer of other site, stage 3 L89.896 Pressure-induced deep tissue damage  of other site L89.153 Pressure ulcer of sacral region, stage 3 I10 Essential (primary) hypertension E03.8 Other specified hypothyroidism E66.09 Other obesity due to excess  calories TALER, KUSHNER ([id number]) [id number] Page 10 of 10 Physician Procedures : CPT4 Code Description Modifier [id number] - WC PHYS LEVEL 3 - EST PT ICD-10 Diagnosis Description I89.0 Lymphedema, not elsewhere classified E11.622 Type 2 diabetes mellitus with other skin ulcer E11.40 Type 2 diabetes mellitus with diabetic  neuropathy, unspecified L89.893 Pressure ulcer of other site, stage 3 Quantity: 1 Electronic Signature(s) Signed: 06/02/2022 1:45:25 PM By: Worthy Keeler PA-C Entered By: Worthy Keeler on 06/02/2022 13:45:24

## 2022-06-06 DIAGNOSIS — L89613 Pressure ulcer of right heel, stage 3: Secondary | ICD-10-CM | POA: Diagnosis not present

## 2022-06-06 DIAGNOSIS — L89893 Pressure ulcer of other site, stage 3: Secondary | ICD-10-CM | POA: Diagnosis not present

## 2022-06-11 DIAGNOSIS — I10 Essential (primary) hypertension: Secondary | ICD-10-CM | POA: Diagnosis not present

## 2022-06-13 DIAGNOSIS — F411 Generalized anxiety disorder: Secondary | ICD-10-CM | POA: Diagnosis not present

## 2022-06-13 DIAGNOSIS — Z89411 Acquired absence of right great toe: Secondary | ICD-10-CM | POA: Diagnosis not present

## 2022-06-13 DIAGNOSIS — L603 Nail dystrophy: Secondary | ICD-10-CM | POA: Diagnosis not present

## 2022-06-13 DIAGNOSIS — R69 Illness, unspecified: Secondary | ICD-10-CM | POA: Diagnosis not present

## 2022-06-28 DIAGNOSIS — I739 Peripheral vascular disease, unspecified: Secondary | ICD-10-CM | POA: Diagnosis not present

## 2022-06-28 DIAGNOSIS — K5900 Constipation, unspecified: Secondary | ICD-10-CM | POA: Diagnosis not present

## 2022-06-28 DIAGNOSIS — I890 Lymphedema, not elsewhere classified: Secondary | ICD-10-CM | POA: Diagnosis not present

## 2022-06-28 DIAGNOSIS — I10 Essential (primary) hypertension: Secondary | ICD-10-CM | POA: Diagnosis not present

## 2022-06-28 DIAGNOSIS — I509 Heart failure, unspecified: Secondary | ICD-10-CM | POA: Diagnosis not present

## 2022-06-28 DIAGNOSIS — E039 Hypothyroidism, unspecified: Secondary | ICD-10-CM | POA: Diagnosis not present

## 2022-06-28 DIAGNOSIS — N189 Chronic kidney disease, unspecified: Secondary | ICD-10-CM | POA: Diagnosis not present

## 2022-06-28 DIAGNOSIS — E559 Vitamin D deficiency, unspecified: Secondary | ICD-10-CM | POA: Diagnosis not present

## 2022-06-29 DIAGNOSIS — I10 Essential (primary) hypertension: Secondary | ICD-10-CM | POA: Diagnosis not present

## 2022-06-30 ENCOUNTER — Ambulatory Visit: Payer: Medicare HMO | Admitting: Physician Assistant

## 2022-06-30 DIAGNOSIS — D649 Anemia, unspecified: Secondary | ICD-10-CM | POA: Diagnosis not present

## 2022-07-01 DIAGNOSIS — E119 Type 2 diabetes mellitus without complications: Secondary | ICD-10-CM | POA: Diagnosis not present

## 2022-07-01 DIAGNOSIS — E0590 Thyrotoxicosis, unspecified without thyrotoxic crisis or storm: Secondary | ICD-10-CM | POA: Diagnosis not present

## 2022-07-01 DIAGNOSIS — E559 Vitamin D deficiency, unspecified: Secondary | ICD-10-CM | POA: Diagnosis not present

## 2022-07-01 DIAGNOSIS — E039 Hypothyroidism, unspecified: Secondary | ICD-10-CM | POA: Diagnosis not present

## 2022-07-05 DIAGNOSIS — L89893 Pressure ulcer of other site, stage 3: Secondary | ICD-10-CM | POA: Diagnosis not present

## 2022-07-05 DIAGNOSIS — L89613 Pressure ulcer of right heel, stage 3: Secondary | ICD-10-CM | POA: Diagnosis not present

## 2022-07-07 DIAGNOSIS — G8929 Other chronic pain: Secondary | ICD-10-CM | POA: Diagnosis not present

## 2022-07-07 DIAGNOSIS — D509 Iron deficiency anemia, unspecified: Secondary | ICD-10-CM | POA: Diagnosis not present

## 2022-07-07 DIAGNOSIS — I10 Essential (primary) hypertension: Secondary | ICD-10-CM | POA: Diagnosis not present

## 2022-07-11 DIAGNOSIS — M6281 Muscle weakness (generalized): Secondary | ICD-10-CM | POA: Diagnosis not present

## 2022-07-12 ENCOUNTER — Ambulatory Visit: Payer: Medicare HMO | Admitting: Physician Assistant

## 2022-07-13 ENCOUNTER — Ambulatory Visit (INDEPENDENT_AMBULATORY_CARE_PROVIDER_SITE_OTHER): Payer: Medicare HMO | Admitting: Nurse Practitioner

## 2022-07-13 ENCOUNTER — Encounter (INDEPENDENT_AMBULATORY_CARE_PROVIDER_SITE_OTHER): Payer: Medicare HMO

## 2022-07-15 DIAGNOSIS — M6281 Muscle weakness (generalized): Secondary | ICD-10-CM | POA: Diagnosis not present

## 2022-07-15 DIAGNOSIS — Z1159 Encounter for screening for other viral diseases: Secondary | ICD-10-CM | POA: Diagnosis not present

## 2022-07-16 DIAGNOSIS — M6281 Muscle weakness (generalized): Secondary | ICD-10-CM | POA: Diagnosis not present

## 2022-07-18 ENCOUNTER — Encounter: Payer: Medicare HMO | Attending: Physician Assistant | Admitting: Physician Assistant

## 2022-07-18 DIAGNOSIS — L89153 Pressure ulcer of sacral region, stage 3: Secondary | ICD-10-CM | POA: Insufficient documentation

## 2022-07-18 DIAGNOSIS — E1122 Type 2 diabetes mellitus with diabetic chronic kidney disease: Secondary | ICD-10-CM | POA: Diagnosis not present

## 2022-07-18 DIAGNOSIS — E1151 Type 2 diabetes mellitus with diabetic peripheral angiopathy without gangrene: Secondary | ICD-10-CM | POA: Insufficient documentation

## 2022-07-18 DIAGNOSIS — N186 End stage renal disease: Secondary | ICD-10-CM | POA: Insufficient documentation

## 2022-07-18 DIAGNOSIS — I890 Lymphedema, not elsewhere classified: Secondary | ICD-10-CM | POA: Insufficient documentation

## 2022-07-18 DIAGNOSIS — M1990 Unspecified osteoarthritis, unspecified site: Secondary | ICD-10-CM | POA: Insufficient documentation

## 2022-07-18 DIAGNOSIS — I10 Essential (primary) hypertension: Secondary | ICD-10-CM | POA: Diagnosis not present

## 2022-07-18 DIAGNOSIS — G8929 Other chronic pain: Secondary | ICD-10-CM | POA: Diagnosis not present

## 2022-07-18 DIAGNOSIS — E1140 Type 2 diabetes mellitus with diabetic neuropathy, unspecified: Secondary | ICD-10-CM | POA: Diagnosis not present

## 2022-07-18 DIAGNOSIS — E038 Other specified hypothyroidism: Secondary | ICD-10-CM | POA: Diagnosis not present

## 2022-07-18 DIAGNOSIS — L97828 Non-pressure chronic ulcer of other part of left lower leg with other specified severity: Secondary | ICD-10-CM | POA: Diagnosis not present

## 2022-07-18 DIAGNOSIS — Z7902 Long term (current) use of antithrombotics/antiplatelets: Secondary | ICD-10-CM | POA: Diagnosis not present

## 2022-07-18 DIAGNOSIS — L89893 Pressure ulcer of other site, stage 3: Secondary | ICD-10-CM | POA: Insufficient documentation

## 2022-07-18 DIAGNOSIS — Z7901 Long term (current) use of anticoagulants: Secondary | ICD-10-CM | POA: Insufficient documentation

## 2022-07-18 DIAGNOSIS — Z6841 Body Mass Index (BMI) 40.0 and over, adult: Secondary | ICD-10-CM | POA: Insufficient documentation

## 2022-07-18 DIAGNOSIS — L89896 Pressure-induced deep tissue damage of other site: Secondary | ICD-10-CM | POA: Insufficient documentation

## 2022-07-18 DIAGNOSIS — L97228 Non-pressure chronic ulcer of left calf with other specified severity: Secondary | ICD-10-CM | POA: Diagnosis not present

## 2022-07-18 DIAGNOSIS — E6609 Other obesity due to excess calories: Secondary | ICD-10-CM | POA: Insufficient documentation

## 2022-07-18 DIAGNOSIS — Z7401 Bed confinement status: Secondary | ICD-10-CM | POA: Diagnosis not present

## 2022-07-18 DIAGNOSIS — E11622 Type 2 diabetes mellitus with other skin ulcer: Secondary | ICD-10-CM | POA: Insufficient documentation

## 2022-07-18 DIAGNOSIS — Z743 Need for continuous supervision: Secondary | ICD-10-CM | POA: Diagnosis not present

## 2022-07-18 DIAGNOSIS — I959 Hypotension, unspecified: Secondary | ICD-10-CM | POA: Diagnosis not present

## 2022-07-18 NOTE — Progress Notes (Addendum)
GREGG, HOLSTER ([id number]) [id number] Page 1 of 13 Visit Report for 07/18/2022 Arrival Information Details Patient Name: Date of Service: [name], [name] 07/18/2022 12:30 PM Medical Record Number: [id number] Patient Account Number: [id number] Date of Birth/Sex: Treating RN: 17-Jan-1956 (67 y.o. Drema Pry Primary Care Nakeda Lebron: [name] Other Clinician: Referring Jerolene Kupfer: Treating Lafaye Mcelmurry/Extender: [name] Weeks in Treatment: 75 Visit Information History Since Last Visit Added or deleted any medications: No Patient Arrived: Stretcher Any new allergies or adverse reactions: No Arrival Time: 12:24 Had a fall or experienced change in No Accompanied By: self activities of daily living that may affect Transfer Assistance: Stretcher risk of falls: Patient Identification Verified: Yes Hospitalized since last visit: No Secondary Verification Process Completed: Yes Has Dressing in Place as Prescribed: Yes Patient Requires Transmission-Based Precautions: No Pain Present Now: No Patient Has Alerts: Yes Patient Alerts: Patient on Blood Thinner TYPE II Diabetic Plavix and Lovenox Sells Signature(s) Signed: 07/18/2022 4:44:06 PM By: Rosalio Loud MSN RN CNS WTA Previous Signature: 07/18/2022 12:28:54 PM Version By: Rosalio Loud MSN RN CNS WTA Entered By: Rosalio Loud on 07/18/2022 16:44:06 -------------------------------------------------------------------------------- Clinic Level of Care Assessment Details Patient Name: Date of Service: Autumn Sanes [name]. 07/18/2022 12:30 PM Medical Record Number: [id number] Patient Account Number: [id number] Date of Birth/Sex: Treating RN: 08/08/1955 (66 y.o. Drema Pry Primary Care Lennin Osmond: [name] Other Clinician: Referring Mialynn Shelvin: Treating Janssen Zee/Extender: [name] Weeks in Treatment: 58 Clinic Level of Care Assessment  Items TOOL 4 Quantity Score X- 1 0 Use when only an EandM is performed on FOLLOW-UP visit ASSESSMENTS - Nursing Assessment / Reassessment X- 1 10 Reassessment of Co-morbidities (includes updates in patient status) X- 1 5 Reassessment of Adherence to Treatment Plan JNAI, SNELLGROVE ([id number]) [id number] Page 2 of 13 ASSESSMENTS - Wound and Skin A ssessment / Reassessment []  - Simple Wound Assessment / Reassessment - one wound 0 X- 4 5 Complex Wound Assessment / Reassessment - multiple wounds []  - 0 Dermatologic / Skin Assessment (not related to wound area) ASSESSMENTS - Focused Assessment []  - 0 Circumferential Edema Measurements - multi extremities []  - 0 Nutritional Assessment / Counseling / Intervention []  - 0 Lower Extremity Assessment (monofilament, tuning fork, pulses) []  - 0 Peripheral Arterial Disease Assessment (using hand held doppler) ASSESSMENTS - Ostomy and/or Continence Assessment and Care []  - 0 Incontinence Assessment and Management []  - 0 Ostomy Care Assessment and Management (repouching, etc.) PROCESS - Coordination of Care []  - 0 Simple Patient / Family Education for ongoing care X- 1 20 Complex (extensive) Patient / Family Education for ongoing care X- 1 10 Staff obtains Programmer, systems, Records, T Results / Process Orders est []  - 0 Staff telephones HHA, Nursing Homes / Clarify orders / etc []  - 0 Routine Transfer to another Facility (non-emergent condition) []  - 0 Routine Hospital Admission (non-emergent condition) []  - 0 New Admissions / Biomedical engineer / Ordering NPWT Apligraf, etc. , []  - 0 Emergency Hospital Admission (emergent condition) []  - 0 Simple Discharge Coordination X- 1 15 Complex (extensive) Discharge Coordination PROCESS - Special Needs []  - 0 Pediatric / Minor Patient Management []  - 0 Isolation Patient Management []  - 0 Hearing / Language / Visual special needs []  - 0 Assessment of  Community assistance (transportation, D/C planning, etc.) []  - 0 Additional assistance / Altered mentation []  - 0 Support Surface(s) Assessment (bed, cushion, seat, etc.) INTERVENTIONS - Wound Cleansing / Measurement []  - 0 Simple  Wound Cleansing - one wound X- 4 5 Complex Wound Cleansing - multiple wounds X- 1 5 Wound Imaging (photographs - any number of wounds) []  - 0 Wound Tracing (instead of photographs) []  - 0 Simple Wound Measurement - one wound X- 4 5 Complex Wound Measurement - multiple wounds INTERVENTIONS - Wound Dressings []  - 0 Small Wound Dressing one or multiple wounds X- 4 15 Medium Wound Dressing one or multiple wounds []  - 0 Large Wound Dressing one or multiple wounds []  - 0 Application of Medications - topical []  - 0 Application of Medications - injection INTERVENTIONS - Miscellaneous []  - 0 External ear exam [name], [name] ([id number]) [id number] Page 3 of 13 []  - 0 Specimen Collection (cultures, biopsies, blood, body fluids, etc.) []  - 0 Specimen(s) / Culture(s) sent or taken to Lab for analysis []  - 0 Patient Transfer (multiple staff / Harrel Lemon Lift / Similar devices) []  - 0 Simple Staple / Suture removal (25 or less) []  - 0 Complex Staple / Suture removal (26 or more) []  - 0 Hypo / Hyperglycemic Management (close monitor of Blood Glucose) []  - 0 Ankle / Brachial Index (ABI) - do not check if billed separately X- 1 5 Vital Signs Has the patient been seen at the hospital within the last three years: Yes Total Score: 190 Level Of Care: New/Established - Level 5 Electronic Signature(s) Signed: 07/18/2022 4:52:15 PM By: Rosalio Loud MSN RN CNS WTA Entered By: Rosalio Loud on 07/18/2022 13:01:42 -------------------------------------------------------------------------------- Encounter Discharge Information Details Patient Name: Date of Service: Autumn Asal. 07/18/2022 12:30 PM Medical Record Number:  [id number] Patient Account Number: [id number] Date of Birth/Sex: Treating RN: 23-Jun-1956 (67 y.o. Drema Pry Primary Care Brandan Glauber: [name] Other Clinician: Referring Jermichael Belmares: Treating Airiel Oblinger/Extender: [name] Weeks in Treatment: 57 Encounter Discharge Information Items Discharge Condition: Stable Ambulatory Status: Stretcher Discharge Destination: Home Transportation: Ambulance Accompanied By: self Schedule Follow-up Appointment: Yes Clinical Summary of Care: Electronic Signature(s) Signed: 07/18/2022 4:44:56 PM By: Rosalio Loud MSN RN CNS WTA Entered By: Rosalio Loud on 07/18/2022 16:44:56 -------------------------------------------------------------------------------- Lower Extremity Assessment Details Patient Name: Date of Service: Autumn Asal. 07/18/2022 12:30 PM [name] ([id number]) [id number] Page 4 of 13 Medical Record Number: [id number] Patient Account Number: [id number] Date of Birth/Sex: Treating RN: 1956-05-27 (67 y.o. Drema Pry Primary Care Nathon Stefanski: [name] Other Clinician: Referring Kourosh Jablonsky: Treating Shaquetta Arcos/Extender: [name] Weeks in Treatment: 17 Electronic Signature(s) Signed: 07/18/2022 4:52:15 PM By: Rosalio Loud MSN RN CNS WTA Entered By: Rosalio Loud on 07/18/2022 13:41:33 -------------------------------------------------------------------------------- Multi Wound Chart Details Patient Name: Date of Service: Autumn Asal. 07/18/2022 12:30 PM Medical Record Number: [id number] Patient Account Number: [id number] Date of Birth/Sex: Treating RN: 1955/11/28 (67 y.o. Drema Pry Primary Care Daisy Lites: [name] Other Clinician: Referring Niko Jakel: Treating Shjon Lizarraga/Extender: [name] Weeks in Treatment: 71 Vital Signs Height(in): 65 Pulse(bpm): 67 Weight(lbs): 245.4 Blood Pressure(mmHg): 103/67 Body Mass Index(BMI):  40.8 Temperature(F): 97.6 Respiratory Rate(breaths/min): 16 [60:Photos:] Right, Circumferential Lower Leg Left, Posterior Lower Leg Left, Lateral Lower Leg Wound Location: Pressure Injury Gradually Appeared Gradually Appeared Wounding Event: Pressure Ulcer Lymphedema Lymphedema Primary Etiology: Lymphedema, Hypertension, Lymphedema, Hypertension, Lymphedema, Hypertension, Comorbid History: Peripheral Arterial Disease, Peripheral Peripheral Arterial Disease, Peripheral Peripheral Arterial Disease, Peripheral Venous Disease, Type II Diabetes, Venous Disease, Type II Diabetes, Venous Disease, Type II Diabetes, End Stage Renal Disease, End Stage Renal Disease, End Stage Renal Disease, Osteoarthritis, Neuropathy Osteoarthritis, Neuropathy Osteoarthritis, Neuropathy 05/17/2021 06/11/2021  06/11/2021 Date Acquired: 64 55 41 Weeks of Treatment: Open Open Open Wound Status: No No No Wound Recurrence: 20x17x0.1 1.7x2x0.12 19x9x0.1 Measurements L x W x D (cm) 267.035 2.67 134.303 A (cm) : rea 26.704 0.32 13.43 Volume (cm) : -47162.80% 91.60% -14264.00% % Reduction in Area: -46749.10% 89.90% -14340.90% % Reduction in Volume: Category/Stage III Full Thickness With Exposed Support Full Thickness Without Exposed Classification: Structures Support Structures Large Large Large Exudate A mount: Sanguinous Serosanguineous Sanguinous Exudate Type: red red, brown red Exudate Color: Small (1-33%) Medium (34-66%) Small (1-33%) Granulation A mount: Red Red Red, Pale Granulation Quality: Small (1-33%) Small (1-33%) Small (1-33%) Necrotic A mount: Small (1-33%) None None EpithelializationJOHN, [name] ([id number]) [id number] Page 5 of 13 Wound Number: 64 N/A N/A Photos: N/A N/A Right Calcaneus N/A N/A Wound Location: Pressure Injury N/A N/A Wounding Event: Pressure Ulcer N/A N/A Primary Etiology: Lymphedema, Hypertension, N/A N/A Comorbid  History: Peripheral Arterial Disease, Peripheral Venous Disease, Type II Diabetes, End Stage Renal Disease, Osteoarthritis, Neuropathy 01/13/2022 N/A N/A Date Acquired: 23 N/A N/A Weeks of Treatment: Open N/A N/A Wound Status: No N/A N/A Wound Recurrence: 1x1x0.2 N/A N/A Measurements L x W x D (cm) 0.785 N/A N/A A (cm) : rea 0.157 N/A N/A Volume (cm) : 66.70% N/A N/A % Reduction in A rea: 33.50% N/A N/A % Reduction in Volume: Category/Stage II N/A N/A Classification: Medium N/A N/A Exudate A mount: Serosanguineous N/A N/A Exudate Type: red, brown N/A N/A Exudate Color: N/A N/A N/A Granulation A mount: N/A N/A N/A Granulation Quality: N/A N/A N/A Necrotic A mount: N/A N/A N/A Exposed Structures: N/A N/A N/A Epithelialization: Treatment Notes Electronic Signature(s) Signed: 07/18/2022 4:52:15 PM By: Rosalio Loud MSN RN CNS WTA Entered By: Rosalio Loud on 07/18/2022 13:41:48 -------------------------------------------------------------------------------- Multi-Disciplinary Care Plan Details Patient Name: Date of Service: Autumn Asal. 07/18/2022 12:30 PM Medical Record Number: [id number] Patient Account Number: [id number] Date of Birth/Sex: Treating RN: [birth year] (67 y.o. Drema Pry Primary Care Lashara Urey: [name] Other Clinician: Referring Audreana Hancox: Treating Corinthian Mizrahi/Extender: [name] Weeks in Treatment: 53 Active Inactive Necrotic Tissue Nursing Diagnoses: Impaired tissue integrity related to necrotic/devitalized tissue Knowledge deficit related to management of necrotic/devitalized tissue GoalsRHEANNE, CORTOPASSI ([id number]) [id number] Page 6 of 13 Necrotic/devitalized tissue will be minimized in the wound bed Date Initiated: 11/26/2021 Target Resolution Date: 11/26/2021 Goal Status: Active Patient/caregiver will verbalize understanding of reason and process for debridement of necrotic  tissue Date Initiated: 11/26/2021 Target Resolution Date: 11/26/2021 Goal Status: Active Interventions: Assess patient pain level pre-, during and post procedure and prior to discharge Provide education on necrotic tissue and debridement process Treatment Activities: Apply topical anesthetic as ordered : 11/26/2021 Notes: Venous Leg Ulcer Nursing Diagnoses: Actual venous Insuffiency (use after diagnosis is confirmed) Knowledge deficit related to disease process and management Potential for venous Insuffiency (use before diagnosis confirmed) Goals: Patient will maintain optimal edema control Date Initiated: 11/26/2021 Target Resolution Date: 11/26/2021 Goal Status: Active Interventions: Assess peripheral edema status every visit. Notes: Wound/Skin Impairment Nursing Diagnoses: Impaired tissue integrity Knowledge deficit related to smoking impact on wound healing Knowledge deficit related to ulceration/compromised skin integrity Goals: Patient/caregiver will verbalize understanding of skin care regimen Date Initiated: 06/03/2021 Target Resolution Date: 06/03/2021 Goal Status: Active Interventions: Assess ulceration(s) every visit Provide education on ulcer and skin care Treatment Activities: Skin care regimen initiated : 06/03/2021 Topical wound management initiated : 06/03/2021 Notes: Electronic Signature(s) Signed: 07/18/2022 4:52:15 PM By: Rosalio Loud MSN RN CNS WTA  Entered By: Rosalio Loud on 07/18/2022 13:02:18 -------------------------------------------------------------------------------- Pain Assessment Details Patient Name: Date of Service: [name], [name] 07/18/2022 12:30 PM Medical Record Number: [id number] Patient Account Number: [id number] [name], [name] ([id number]) [id number].pdf Page 7 of 13 Date of Birth/Sex: Treating RN: 1955-09-05 (67 y.o. Drema Pry Primary Care Marieanne Marxen: [name] Other Clinician: Referring  Leondro Coryell: Treating Daryn Pisani/Extender: [name] Weeks in Treatment: 21 Active Problems Location of Pain Severity and Description of Pain Patient Has Paino Yes Site Locations Pain Location: Pain in Ulcers With Dressing Change: Yes Rate the pain. Current Pain Level: 4 Worst Pain Level: 8 Least Pain Level: 1 Tolerable Pain Level: 2 Pain Management and Medication Current Pain Management: Electronic Signature(s) Signed: 07/18/2022 4:52:15 PM By: Rosalio Loud MSN RN CNS WTA Entered By: Rosalio Loud on 07/18/2022 12:58:22 -------------------------------------------------------------------------------- Patient/Caregiver Education Details Patient Name: Date of Service: Autumn Asal 1/22/2024andnbsp12:30 PM Medical Record Number: [id number] Patient Account Number: [id number] Date of Birth/Gender: Treating RN: 1956/03/28 (67 y.o. Drema Pry Primary Care Physician: [name] Other Clinician: Referring Physician: Treating Physician/Extender: [name] Weeks in Treatment: 48 Education Assessment Education Provided To: Patient Education Topics Provided Wound/Skin Impairment: Handouts: Caring for Your Ulcer Methods: Explain/Verbal Responses: State content correctly Electronic Signature(s) [name], [name] ([id number]) [id number] Page 8 of 13 Signed: 07/18/2022 4:52:15 PM By: Rosalio Loud MSN RN CNS WTA Entered By: Rosalio Loud on 07/18/2022 13:02:13 -------------------------------------------------------------------------------- Wound Assessment Details Patient Name: Date of Service: [name], [name] 07/18/2022 12:30 PM Medical Record Number: [id number] Patient Account Number: [id number] Date of Birth/Sex: Treating RN: 1955/12/29 (67 y.o. Drema Pry Primary Care Ariadna Setter: [name] Other Clinician: Referring Liba Hulsey: Treating Nakya Weyand/Extender: [name] Weeks in Treatment:  58 Wound Status Wound Number: 60 Primary Pressure Ulcer Etiology: Wound Location: Right, Circumferential Lower Leg Wound Open Wounding Event: Pressure Injury Status: Date Acquired: 05/17/2021 Comorbid Lymphedema, Hypertension, Peripheral Arterial Disease, Peripheral Weeks Of Treatment: 58 History: Venous Disease, Type II Diabetes, End Stage Renal Disease, Clustered Wound: No Osteoarthritis, Neuropathy Photos Wound Measurements Length: (cm) 20 Width: (cm) 17 Depth: (cm) 0.1 Area: (cm) 267.035 Volume: (cm) 26.704 % Reduction in Area: -47162.8% % Reduction in Volume: -46749.1% Epithelialization: Small (1-33%) Wound Description Classification: Category/Stage III Exudate Amount: Large Exudate Type: Sanguinous Exudate Color: red Foul Odor After Cleansing: No Slough/Fibrino Yes Wound Bed Granulation Amount: Small (1-33%) Granulation Quality: Red Necrotic Amount: Small (1-33%) Necrotic Quality: Adherent Slough Treatment Notes Wound #60 (Lower Leg) Wound Laterality: Right, Circumferential Cleanser Normal Saline Discharge Instruction: Wash your hands with soap and water. Remove old dressing, discard into plastic bag and place into trash. Cleanse the KIELY, COUSAR ([id number]) [id number] Page 9 of 13 wound with Normal Saline prior to applying a clean dressing using gauze sponges, not tissues or cotton balls. Do not scrub or use excessive force. Pat dry using gauze sponges, not tissue or cotton balls. Peri-Wound Care Topical Primary Dressing Xeroform 5x9-HBD (in/in) Discharge Instruction: Apply Xeroform 5x9-HBD (in/in) as directed Secondary Dressing ABD Pad 5x9 (in/in) Discharge Instruction: 2. Cover with ABD pad Secured With ACE WRAP - 58M ACE Elastic Bandage With VELCRO Brand Closure, 4 (in) Discharge Instruction: 4. Ace wrap to secure dressing in place and control swelling. Kerlix Roll Sterile or Non-Sterile 6-ply 4.5x4 (yd/yd) Discharge  Instruction: 3. Apply Kerlix as directed Compression Wrap Compression Stockings Add-Ons Electronic Signature(s) Signed: 07/18/2022 4:52:15 PM By: Rosalio Loud MSN RN CNS WTA Entered By: Rosalio Loud on 07/18/2022 13:39:30 -------------------------------------------------------------------------------- Wound  Assessment Details Patient Name: Date of Service: [name], [name] 07/18/2022 12:30 PM Medical Record Number: [id number] Patient Account Number: [id number] Date of Birth/Sex: Treating RN: 1955/08/21 (67 y.o. Drema Pry Primary Care Breklyn Fabrizio: [name] Other Clinician: Referring Joeziah Voit: Treating Modean Mccullum/Extender: [name] Weeks in Treatment: 58 Wound Status Wound Number: 61 Primary Lymphedema Etiology: Wound Location: Left, Posterior Lower Leg Wound Open Wounding Event: Gradually Appeared Status: Date Acquired: 06/11/2021 Comorbid Lymphedema, Hypertension, Peripheral Arterial Disease, Peripheral Weeks Of Treatment: 55 History: Venous Disease, Type II Diabetes, End Stage Renal Disease, Clustered Wound: No Osteoarthritis, Neuropathy Photos [name], [name] ([id number]) [id number] Page 10 of 13 Wound Measurements Length: (cm) 1.7 Width: (cm) 2 Depth: (cm) 0.12 Area: (cm) 2.67 Volume: (cm) 0.32 % Reduction in Area: 91.6% % Reduction in Volume: 89.9% Epithelialization: None Wound Description Classification: Full Thickness With Exposed Suppo Exudate Amount: Large Exudate Type: Serosanguineous Exudate Color: red, brown rt Structures Foul Odor After Cleansing: No Slough/Fibrino Yes Wound Bed Granulation Amount: Medium (34-66%) Exposed Structure Granulation Quality: Red Fat Layer (Subcutaneous Tissue) Exposed: Yes Necrotic Amount: Small (1-33%) Necrotic Quality: Adherent Slough Treatment Notes Wound #61 (Lower Leg) Wound Laterality: Left, Posterior Cleanser Normal Saline Discharge Instruction: Wash your  hands with soap and water. Remove old dressing, discard into plastic bag and place into trash. Cleanse the wound with Normal Saline prior to applying a clean dressing using gauze sponges, not tissues or cotton balls. Do not scrub or use excessive force. Pat dry using gauze sponges, not tissue or cotton balls. Peri-Wound Care Topical Primary Dressing Xeroform 5x9-HBD (in/in) Discharge Instruction: Apply Xeroform 5x9-HBD (in/in) as directed Secondary Dressing ABD Pad 5x9 (in/in) Discharge Instruction: 2. Cover with ABD pad Secured With ACE WRAP - 55M ACE Elastic Bandage With VELCRO Brand Closure, 4 (in) Discharge Instruction: 4. Ace wrap to secure dressing in place and control swelling. Kerlix Roll Sterile or Non-Sterile 6-ply 4.5x4 (yd/yd) Discharge Instruction: 3. Apply Kerlix as directed Compression Wrap Compression Stockings Add-Ons Electronic Signature(s) Signed: 07/18/2022 4:52:15 PM By: Rosalio Loud MSN RN CNS WTA Entered By: Rosalio Loud on 07/18/2022 13:40:06 -------------------------------------------------------------------------------- Wound Assessment Details Patient Name: Date of Service: Autumn Asal. 07/18/2022 12:30 PM Medical Record Number: [id number] Patient Account Number: [id number] [name], [name] ([id number]) [phone].pdf Page 11 of 13 Date of Birth/Sex: Treating RN: 10-14-1955 (67 y.o. Drema Pry Primary Care Minsa Weddington: [name] Other Clinician: Referring Maryse Brierley: Treating Will Heinkel/Extender: [name] Weeks in Treatment: 58 Wound Status Wound Number: 62 Primary Lymphedema Etiology: Wound Location: Left, Lateral Lower Leg Wound Open Wounding Event: Gradually Appeared Status: Date Acquired: 06/11/2021 Comorbid Lymphedema, Hypertension, Peripheral Arterial Disease, Peripheral Weeks Of Treatment: 55 History: Venous Disease, Type II Diabetes, End Stage Renal Disease, Clustered Wound: No  Osteoarthritis, Neuropathy Photos Wound Measurements Length: (cm) 19 Width: (cm) 9 Depth: (cm) 0.1 Area: (cm) 134.303 Volume: (cm) 13.43 % Reduction in Area: -14264% % Reduction in Volume: -14340.9% Epithelialization: None Wound Description Classification: Full Thickness Without Exposed Suppor Exudate Amount: Large Exudate Type: Sanguinous Exudate Color: red t Structures Wound Bed Granulation Amount: Small (1-33%) Exposed Structure Granulation Quality: Red, Pale Fat Layer (Subcutaneous Tissue) Exposed: Yes Necrotic Amount: Small (1-33%) Necrotic Quality: Adherent Slough Treatment Notes Wound #62 (Lower Leg) Wound Laterality: Left, Lateral Cleanser Normal Saline Discharge Instruction: Wash your hands with soap and water. Remove old dressing, discard into plastic bag and place into trash. Cleanse the wound with Normal Saline prior to applying a clean dressing using gauze sponges, not tissues  or cotton balls. Do not scrub or use excessive force. Pat dry using gauze sponges, not tissue or cotton balls. Peri-Wound Care Topical Primary Dressing Xeroform 5x9-HBD (in/in) Discharge Instruction: Apply Xeroform 5x9-HBD (in/in) as directed Secondary Dressing ABD Pad 5x9 (in/in) Discharge Instruction: 2. Cover with ABD pad Secured With ACE WRAP - 25M ACE Elastic Bandage With VELCRO Brand Closure, 4 (in) Discharge Instruction: 4. Ace wrap to secure dressing in place and control swelling. Kerlix Roll Sterile or Non-Sterile 6-ply 4.5x4 (yd/yd) Discharge Instruction: 3. Apply Kerlix as directed [name], MADDUX ([id number]) [id number] Page 12 of 13 Compression Wrap Compression Stockings Add-Ons Electronic Signature(s) Signed: 07/18/2022 4:52:15 PM By: Rosalio Loud MSN RN CNS WTA Entered By: Rosalio Loud on 07/18/2022 13:40:45 -------------------------------------------------------------------------------- Wound Assessment Details Patient Name: Date of  Service: Autumn Asal. 07/18/2022 12:30 PM Medical Record Number: [id number] Patient Account Number: [id number] Date of Birth/Sex: Treating RN: December 18, 1955 (67 y.o. Drema Pry Primary Care Richardo Popoff: [name] Other Clinician: Referring Tage Feggins: Treating Shantale Holtmeyer/Extender: [name] Weeks in Treatment: 58 Wound Status Wound Number: 67 Primary Pressure Ulcer Etiology: Wound Location: Right Calcaneus Wound Open Wounding Event: Pressure Injury Status: Date Acquired: 01/13/2022 Comorbid Lymphedema, Hypertension, Peripheral Arterial Disease, Peripheral Weeks Of Treatment: 23 History: Venous Disease, Type II Diabetes, End Stage Renal Disease, Clustered Wound: No Osteoarthritis, Neuropathy Photos Wound Measurements Length: (cm) 1 Width: (cm) 1 Depth: (cm) 0.2 Area: (cm) 0.785 Volume: (cm) 0.157 % Reduction in Area: 66.7% % Reduction in Volume: 33.5% Wound Description Classification: Category/Stage II Exudate Amount: Medium Exudate Type: Serosanguineous Exudate Color: red, brown Treatment Notes Wound #67 (Calcaneus) Wound Laterality: Right Cleanser Normal Saline Staffa, Eron [name] ([id number]) [id number] Page 13 of 13 Discharge Instruction: Wash your hands with soap and water. Remove old dressing, discard into plastic bag and place into trash. Cleanse the wound with Normal Saline prior to applying a clean dressing using gauze sponges, not tissues or cotton balls. Do not scrub or use excessive force. Pat dry using gauze sponges, not tissue or cotton balls. Peri-Wound Care Topical Primary Dressing Xeroform 5x9-HBD (in/in) Discharge Instruction: Apply Xeroform 5x9-HBD (in/in) as directed Secondary Dressing ABD Pad 5x9 (in/in) Discharge Instruction: 2. Cover with ABD pad Secured With ACE WRAP - 25M ACE Elastic Bandage With VELCRO Brand Closure, 4 (in) Discharge Instruction: 4. Ace wrap to secure dressing in place and  control swelling. Kerlix Roll Sterile or Non-Sterile 6-ply 4.5x4 (yd/yd) Discharge Instruction: 3. Apply Kerlix as directed Compression Wrap Compression Stockings Add-Ons Electronic Signature(s) Signed: 07/18/2022 4:52:15 PM By: Rosalio Loud MSN RN CNS WTA Entered By: Rosalio Loud on 07/18/2022 13:41:19 -------------------------------------------------------------------------------- Vitals Details Patient Name: Date of Service: Autumn Asal. 07/18/2022 12:30 PM Medical Record Number: [id number] Patient Account Number: [id number] Date of Birth/Sex: Treating RN: 02/25/1956 (67 y.o. Drema Pry Primary Care Brennden Masten: [name] Other Clinician: Referring Tanazia Achee: Treating Leahna Hewson/Extender: [name] Weeks in Treatment: 12 Vital Signs Time Taken: 12:30 Temperature (F): 97.6 Height (in): 65 Pulse (bpm): 67 Weight (lbs): 245.4 Respiratory Rate (breaths/min): 16 Body Mass Index (BMI): 40.8 Blood Pressure (mmHg): 103/67 Reference Range: 80 - 120 mg / dl Electronic Signature(s) Signed: 07/18/2022 4:44:16 PM By: Rosalio Loud MSN RN CNS WTA Entered By: Rosalio Loud on 07/18/2022 16:44:16

## 2022-07-18 NOTE — Progress Notes (Addendum)
[name], [name] ([id number]) [id number] Page 1 of 10 Visit Report for 07/18/2022 Chief Complaint Document Details Patient Name: Date of Service: [name], [name] 07/18/2022 12:30 PM Medical Record Number: [id number] Patient Account Number: [id number] Date of Birth/Sex: Treating RN: 20-Mar-1956 (67 y.o. 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 New sacral wound as of 10/29/21 Electronic Signature(s) Signed: 07/18/2022 12:50:36 PM By: Worthy Keeler PA-[name] Entered By: Worthy Keeler on 07/18/2022 12:50:36 -------------------------------------------------------------------------------- HPI Details Patient Name: Date of Service: [name]. 07/18/2022 12:30 PM Medical Record Number: [id number] Patient Account Number: [id number] Date of Birth/Sex: Treating RN: [birth year] (67 y.o. Drema Pry Primary Care Provider: [name] Other Clinician: Referring Provider: Treating Provider/Extender: [name] Weeks in Treatment: 23 History of Present Illness HPI Description: 67yo w/ h/o BLE phlebolymphedema, type 2 DM (unknown hemoglobin A1c), and obesity. No PVD. h/o chronic, recurrent BLE calf ulcers. Treated with 4 layer compression. Infrequently uses lymphedema pump. Bilateral lower extremity ulcerations healed as of June 2016. Fitted for custom compression stockings but did not receive them. Patient could not travel for lymphedema consult. She developed recurrent bilateral lower extremity ulcerations in August 2016. She is able to heal these quite quickly with 4 layer elastic compression bandages. However, she is not very compliant using her juxtalite compression garments and tends to  develop recurrent ulcerations while using juxtalite. Unna boots were applied this past week. She tolerated this well but performs minimal ambulation. She is scheduled to undergo total hip replacement tomorrow with Dr. Rudene Christians. She is unsure if he is aware of her ulcerations. She is otherwise without complaints. No significant pain. No fever or chills. Moderate clear drainage. Not on any antibiotics. 06/04/2015 -- she did have her hip surgery and has now been in rehabilitation and they're controlling her fluid intake and diuretics to the extent that she has lost 13 pounds of weight and her lower extremity circumference is decreased by 7 cm. 06/18/2015 -- she is still in the rehabilitation facility and continues to be looked after well and has lost a total of 15 pounds and her diabetes is under much better control. [name], [name] ([id number]) [id number] Page 2 of 10 Readmission: 06/03/2021 upon evaluation today patient actually appears to be doing somewhat poorly in regard to a wound on the right posterior lower leg. Patient does have a history of previously having been seen here in the clinic but this predates my time here back in 2017. Subsequently unfortunately right now she is having a significant issue with bleeding in the posterior of her right calf this appears to be a deep tissue injury as developed into an ulceration. She does have a history significant for lymphedema, diabetes mellitus type 2, diabetic neuropathy, hypertension, hypothyroidism, and obesity. She is currently in a skilled nursing facility. The dressing that she had in place during evaluation today was Xeroform followed by significant amount of gauze and this was severely stuck. Upon removal the patient was having significant bleeding she also had significant work done in the hospital in regard to her blood flow and Dr. Lucky Cowboy does look like he has her on 2 blood thinners Plavix and Lovenox. This is  probably part of the reason why she is bleeding so significantly. With that being said I do believe that based on what we are  seeing currently this is probably can to be something that were getting have to try to get stopped with more than just pressure to the region currently. I think surgery foam is probably can to be necessary. If we can get the stop of the Surgifoam she is probably can have to go to the hospital to try to get this bleeding stopped. 12/30; this is a patient who is currently in a skilled facility. She has wounds on her bilateral calfs likely secondary to severe chronic venous insufficiency and lymphedema the care of these wounds is complicated by the fact that she is on Lovenox and Plavix and bleeds very frequently with even minimal trauma to these wounds. It would appear that she had a Vaseline gauze on the surface of these in the process of undressing them she bled freely from especially the right but also the left. I had to go in and use silver nitrate. The skin on her legs is significant for chronic venous stasis and lymphedema. Very dry flaking skin 07/16/2021; I actually was a patent person who saw this woman 67 y.o. She is a nursing facility has chronic venous wounds and bilateral lower extremity wounds. We use Xeroform under compression. The time she was on Lovenox and Plavix I thought I had an excellent reason for these wounds to bleed so freely. Since then the Lovenox has been stopped and should be well out of her system but she is still on Plavix. She still has very friable bleeding wounds on the left lateral left Posterior and right Posterior lower extremity.These believe very freely when we take off the dressing Also she has a left heel wound which when we saw her last time was a surgical wound however I gather we have been asked to follow this as well. I am not exactly sure at this time what her surgery was but she comes in with a wound VAC on this and they are  apparently changing it at the nursing home. 08/27/2021 upon evaluation patient appears to be doing better in regard to her wound on the legs as well as the heel. Fortunately at this point I do not see any need for a wound VAC which is good news overall I think that the Xeroform is probably a good option that is what is being utilized here as well. Fortunately I do not see any signs of active infection locally nor systemically which is great news and overall I think that the patient is making excellent progress. The area on her right gluteal area actually appears to be doing quite well and I am pleased with that. I do believe the silver alginate dressing here could be of benefit for her. 09/17/2021 upon evaluation patient appears to be doing actually better in regard to her wounds on the legs. Fortunately she does not seem to be showing any signs of worsening which is great news and overall very pleased with where we stand. I do believe that she is tolerating the dressing changes without complication which is great news. 10-08-2021 upon evaluation today patient unfortunately continues to have significant issues with her legs. Unfortunately the biggest issue that we see Currently is that she is still having a lot of bleeding from her legs that she is also not able to move well due to her knee pain her left knee is quite significant as far as the discomfort is concerned and the right knee is getting equally as bad. Fortunately I do not see any evidence  of active infection locally or systemically which is great news. Nonetheless I do believe that her heels are looking much better there is a tiny area open on the left heel the right is doing quite well. In regard to the gluteal/sacral region this appears to be healed. 10-29-2021 upon evaluation today patient unfortunately is appearing to do much worse compared to normal. She has more breakdown in regard to the left lateral leg and again I think this is pressure  related. She also has more breakdown in regard to the gluteal area and she is very concerned about this in particular which is completely understandable. With that being said I do believe that she would benefit from more aggressive and appropriate offloading I discussed with her that she needs to allow the facility to change her position every 2 hours and she is can have to find different positions other than just lying on her back. Also discussed with her that obviously the facility needs to make sure to help her with repositioning every 2 hours I think both aspects of this are important. Otherwise her wound on the sacral area is going to continue to get worse and the leg will continue to get worse. 11-26-2021 upon evaluation today patient appears to be doing well with regard to her wounds all things considered. The gluteal areas are all healed which is great news. With that being said she is still having some issues here with her lower extremities. I do think that this is still something that we need to be very mindful of. Unfortunately the facility is still not washing her legs she has a lot of caked on dry skin with Xeroform we will get a try to get some of that off today. With that being said he does have reiterated in the past the patient needs to have her legs washed regularly. 12-24-2021 upon evaluation patient's wounds are showing signs of improvement albeit slowly. Fortunately I do not see any signs of infection locally or systemically which is great news. Nonetheless I do believe that the patient is doing quite well from the standpoint of the overall appearance of her wounds which are better than where they have been even if not where we would like them to be. 02-03-2022 upon evaluation today patient appears to be doing about the same in regard to her wounds. There is maybe a little bit of improvement in general based on what I am seeing currently but not much to be honest. I do think that the  patient is making some progress here when it comes to the overall appearance of her wounds. 04-25-2022 upon evaluation patient actually is here for the first time in about 2-1/2 months. She has had multiple reasons why she did not make it part of the time she was sick and some of the times it was more of a transportation thing. Nonetheless I am glad that she is finally here good news is some of the wounds do appear to be a little bit drier and she has been using a alginate dressing at the facility which they switched her to honestly I cannot argue with the fact that it seems to be doing somewhat better in my opinion. With that being said I do believe one of the biggest issues that I see is simply that she is still having some issues here with pressure although I do not see as much of the dark discoloration as we previously noted she has been using the Prevalon offloading  boots which is excellent. I want her to keep using that as I think that still good to be ideal and optimal for her. She does inquire about being able to put pressure on her heels at this point. 06-02-2022 upon evaluation today patient appears to be unfortunately still having a lot of issues with her legs. Were not seeing a whole lot of improvement she is not as bad as she has been at some point in time but she is also not as good as we would like to see. 07-18-2022 upon evaluation today patient appears to be doing well currently in regard to her wounds on the legs all things considered. This is actually showing signs of improvement compared to where we have been. Overall I am extremely pleased in that regard. She does have a small area open on her sacral region but this is minimal at this point and actually just more of an irritation at the just the topical barrier cream is ideal here. Electronic Signature(s) Signed: 07/18/2022 1:00:39 PM By: Worthy Keeler PA-[name] Entered By: Worthy Keeler on 07/18/2022 13:00:39 [name]  ([id number]) [id number] Page 3 of 10 -------------------------------------------------------------------------------- Physical Exam Details Patient Name: Date of Service: [name], [name] 07/18/2022 12:30 PM Medical Record Number: [id number] Patient Account Number: [id number] Date of Birth/Sex: Treating RN: Mar 10, 1956 (67 y.o. Drema Pry Primary Care Provider: [name] Other Clinician: Referring Provider: Treating Provider/Extender: [name] Weeks in Treatment: 25 Constitutional Well-nourished and well-hydrated in no acute distress. Respiratory normal breathing without difficulty. Psychiatric this patient is able to make decisions and demonstrates good insight into disease process. Alert and Oriented x 3. pleasant and cooperative. Notes Upon inspection patient's wound bed actually showed signs of good granulation epithelization at this point. She still has a lot of open wounds on the extremities and again there is a lot of friability due to the blood thinner that she is on but in general I feel like that she is actually making some improvements compared to even last time I saw her. And overall I think we are on the right track here. Electronic Signature(s) Signed: 07/18/2022 1:03:18 PM By: Worthy Keeler PA-[name] Entered By: Worthy Keeler on 07/18/2022 13:03:18 -------------------------------------------------------------------------------- Physician Orders Details Patient Name: Date of Service: [name], [name] 07/18/2022 12:30 PM Medical Record Number: [id number] Patient Account Number: [id number] Date of Birth/Sex: Treating RN: [birth year] (67 y.o. Drema Pry Primary Care Provider: [name] Other Clinician: Referring Provider: Treating Provider/Extender: [name] Weeks in Treatment: 67 Verbal / Phone Orders: No Diagnosis Coding ICD-10 Coding Code Description I89.0 Lymphedema, not elsewhere  classified E11.622 Type 2 diabetes mellitus with other skin ulcer E11.40 Type 2 diabetes mellitus with diabetic neuropathy, unspecified L89.893 Pressure ulcer of other site, stage 3 L89.896 Pressure-induced deep tissue damage of other site L89.153 Pressure ulcer of sacral region, stage 3 [name], [name] ([id number]) [id number] Page 4 of 10 I10 Essential (primary) hypertension E03.8 Other specified hypothyroidism E66.09 Other obesity due to excess calories Follow-up Appointments Wound #60 Right,Circumferential Lower Leg Return Appointment in 1 month Bathing/ Shower/ Hygiene Wash wounds with antibacterial soap and water. - WASH LEGS WITH SOAP and WATER before applying dressings. No tub bath. Edema Control - Lymphedema / Segmental Compressive Device / Other Other: - Lymphedema-use ace wraps to help control swelling. Off-Loading Turn and reposition every 2 hours - PLEASE MAKE SURE THIS IS COMPLETED EVERY TWO HOURS THANK YOU Other: - Put pillows under knees  to keep pressure off of calves. Deep tissue injury on posterior calves. Turn on side every 1 hous to keep pressure off calves wear offloading boots Additional Orders / Instructions Follow Nutritious Diet and Increase Protein Intake Wound Treatment Wound #60 - Lower Leg Wound Laterality: Right, Circumferential Cleanser: Normal Saline 3 x Per Week/30 Days Discharge Instructions: Wash your hands with soap and water. Remove old dressing, discard into plastic bag and place into trash. Cleanse the wound with Normal Saline prior to applying a clean dressing using gauze sponges, not tissues or cotton balls. Do not scrub or use excessive force. Pat dry using gauze sponges, not tissue or cotton balls. Prim Dressing: Xeroform 5x9-HBD (in/in) 3 x Per Week/30 Days ary Discharge Instructions: Apply Xeroform 5x9-HBD (in/in) as directed Secondary Dressing: ABD Pad 5x9 (in/in) 3 x Per Week/30 Days Discharge  Instructions: 2. Cover with ABD pad Secured With: ACE WRAP - 51M ACE Elastic Bandage With VELCRO Brand Closure, 4 (in) 3 x Per Week/30 Days Discharge Instructions: 4. Ace wrap to secure dressing in place and control swelling. Secured With: [employer] or Non-Sterile 6-ply 4.5x4 (yd/yd) 3 x Per Week/30 Days Discharge Instructions: 3. Apply Kerlix as directed Wound #61 - Lower Leg Wound Laterality: Left, Posterior Cleanser: Normal Saline 3 x Per Week/30 Days Discharge Instructions: Wash your hands with soap and water. Remove old dressing, discard into plastic bag and place into trash. Cleanse the wound with Normal Saline prior to applying a clean dressing using gauze sponges, not tissues or cotton balls. Do not scrub or use excessive force. Pat dry using gauze sponges, not tissue or cotton balls. Prim Dressing: Xeroform 5x9-HBD (in/in) 3 x Per Week/30 Days ary Discharge Instructions: Apply Xeroform 5x9-HBD (in/in) as directed Secondary Dressing: ABD Pad 5x9 (in/in) 3 x Per Week/30 Days Discharge Instructions: 2. Cover with ABD pad Secured With: ACE WRAP - 51M ACE Elastic Bandage With VELCRO Brand Closure, 4 (in) 3 x Per Week/30 Days Discharge Instructions: 4. Ace wrap to secure dressing in place and control swelling. Secured With: [employer] or Non-Sterile 6-ply 4.5x4 (yd/yd) 3 x Per Week/30 Days Discharge Instructions: 3. Apply Kerlix as directed Wound #62 - Lower Leg Wound Laterality: Left, Lateral Cleanser: Normal Saline 3 x Per Week/30 Days Discharge Instructions: Wash your hands with soap and water. Remove old dressing, discard into plastic bag and place into trash. Cleanse the wound with Normal Saline prior to applying a clean dressing using gauze sponges, not tissues or cotton balls. Do not scrub or use excessive force. Pat dry using gauze sponges, not tissue or cotton balls. Prim Dressing: Xeroform 5x9-HBD (in/in) 3 x Per Week/30 Days ary Discharge Instructions: Apply  Xeroform 5x9-HBD (in/in) as directed Secondary Dressing: ABD Pad 5x9 (in/in) 3 x Per Week/30 Days Discharge Instructions: 2. Cover with ABD pad Secured With: ACE WRAP - 51M ACE Elastic Bandage With VELCRO Brand Closure, 4 (in) 3 x Per Week/30 Days Discharge Instructions: 4. Ace wrap to secure dressing in place and control swelling. Secured With: [employer] or Non-Sterile 6-ply 4.5x4 (yd/yd) 3 x Per Week/30 Days [name], [name] ([id number]) [id number] Page 5 of 10 Discharge Instructions: 3. Apply Kerlix as directed Wound #67 - Calcaneus Wound Laterality: Right Cleanser: Normal Saline 3 x Per Week/30 Days Discharge Instructions: Wash your hands with soap and water. Remove old dressing, discard into plastic bag and place into trash. Cleanse the wound with Normal Saline prior to applying a clean dressing using gauze sponges, not tissues  or cotton balls. Do not scrub or use excessive force. Pat dry using gauze sponges, not tissue or cotton balls. Prim Dressing: Xeroform 5x9-HBD (in/in) 3 x Per Week/30 Days ary Discharge Instructions: Apply Xeroform 5x9-HBD (in/in) as directed Secondary Dressing: ABD Pad 5x9 (in/in) 3 x Per Week/30 Days Discharge Instructions: 2. Cover with ABD pad Secured With: ACE WRAP - 42M ACE Elastic Bandage With VELCRO Brand Closure, 4 (in) 3 x Per Week/30 Days Discharge Instructions: 4. Ace wrap to secure dressing in place and control swelling. Secured With: [employer] or Non-Sterile 6-ply 4.5x4 (yd/yd) 3 x Per Week/30 Days Discharge Instructions: 3. Apply Kerlix as directed Electronic Signature(s) Signed: 07/18/2022 4:52:15 PM By: Rosalio Loud MSN RN CNS WTA Signed: 07/18/2022 5:32:00 PM By: Worthy Keeler PA-[name] Entered By: Rosalio Loud on 07/18/2022 13:00:39 -------------------------------------------------------------------------------- Problem List Details Patient Name: Date of Service: [name]. 07/18/2022  12:30 PM Medical Record Number: [id number] Patient Account Number: [id number] Date of Birth/Sex: Treating RN: 14-Sep-1955 (67 y.o. Drema Pry Primary Care Provider: [name] Other Clinician: Referring Provider: Treating Provider/Extender: [name] Weeks in Treatment: 84 Active Problems ICD-10 Encounter Code Description Active Date MDM Diagnosis I89.0 Lymphedema, not elsewhere classified 06/03/2021 No Yes E11.622 Type 2 diabetes mellitus with other skin ulcer 06/03/2021 No Yes E11.40 Type 2 diabetes mellitus with diabetic neuropathy, unspecified 06/03/2021 No Yes L89.893 Pressure ulcer of other site, stage 3 06/03/2021 No Yes L89.896 Pressure-induced deep tissue damage of other site 06/03/2021 No Yes [name], [name] ([id number]) [id number] Page 6 of 10 L89.153 Pressure ulcer of sacral region, stage 3 08/27/2021 No Yes I10 Essential (primary) hypertension 06/03/2021 No Yes E03.8 Other specified hypothyroidism 06/03/2021 No Yes E66.09 Other obesity due to excess calories 06/03/2021 No Yes Inactive Problems Resolved Problems Electronic Signature(s) Signed: 07/18/2022 4:52:15 PM By: Rosalio Loud MSN RN CNS WTA Signed: 07/18/2022 5:32:00 PM By: Worthy Keeler PA-[name] Previous Signature: 07/18/2022 12:50:31 PM Version By: Worthy Keeler PA-[name] Entered By: Rosalio Loud on 07/18/2022 13:02:31 -------------------------------------------------------------------------------- Progress Note Details Patient Name: Date of Service: [name]. 07/18/2022 12:30 PM Medical Record Number: [id number] Patient Account Number: [id number] Date of Birth/Sex: Treating RN: 1955-10-03 (67 y.o. Drema Pry Primary Care Provider: [name] Other Clinician: Referring Provider: Treating Provider/Extender: [name] Weeks in Treatment: 55 Subjective Chief Complaint Information obtained from Patient Bilateral lower extremity  phlebolymphedema and recurrent calf ulcerations. New sacral wound as of 10/29/21 History of Present Illness (HPI) 67yo w/ h/o BLE phlebolymphedema, type 2 DM (unknown hemoglobin A1c), and obesity. No PVD. h/o chronic, recurrent BLE calf ulcers. Treated with 4 layer compression. Infrequently uses lymphedema pump. Bilateral lower extremity ulcerations healed as of June 2016. Fitted for custom compression stockings but did not receive them. Patient could not travel for lymphedema consult. She developed recurrent bilateral lower extremity ulcerations in August 2016. She is able to heal these quite quickly with 4 layer elastic compression bandages. However, she is not very compliant using her juxtalite compression garments and tends to develop recurrent ulcerations while using juxtalite. Unna boots were applied this past week. She tolerated this well but performs minimal ambulation. She is scheduled to undergo total hip replacement tomorrow with Dr. Rudene Christians. She is unsure if he is aware of her ulcerations. She is otherwise without complaints. No significant pain. No fever or chills. Moderate clear drainage. Not on any antibiotics. 06/04/2015 -- she did have her hip surgery and has now been in rehabilitation and  they're controlling her fluid intake and diuretics to the extent that she has lost 13 pounds of weight and her lower extremity circumference is decreased by 7 cm. 06/18/2015 -- she is still in the rehabilitation facility and continues to be looked after well and has lost a total of 15 pounds and her diabetes is under much better control. Readmission: 06/03/2021 upon evaluation today patient actually appears to be doing somewhat poorly in regard to a wound on the right posterior lower leg. Patient does have a history of previously having been seen here in the clinic but this predates my time here back in 2017. Subsequently unfortunately right now she is having a significant issue with bleeding in the  posterior of her right calf this appears to be a deep tissue injury as developed into an ulceration. [name], [name] ([id number]) [id number] Page 7 of 10 She does have a history significant for lymphedema, diabetes mellitus type 2, diabetic neuropathy, hypertension, hypothyroidism, and obesity. She is currently in a skilled nursing facility. The dressing that she had in place during evaluation today was Xeroform followed by significant amount of gauze and this was severely stuck. Upon removal the patient was having significant bleeding she also had significant work done in the hospital in regard to her blood flow and Dr. Lucky Cowboy does look like he has her on 2 blood thinners Plavix and Lovenox. This is probably part of the reason why she is bleeding so significantly. With that being said I do believe that based on what we are seeing currently this is probably can to be something that were getting have to try to get stopped with more than just pressure to the region currently. I think surgery foam is probably can to be necessary. If we can get the stop of the Surgifoam she is probably can have to go to the hospital to try to get this bleeding stopped. 12/30; this is a patient who is currently in a skilled facility. She has wounds on her bilateral calfs likely secondary to severe chronic venous insufficiency and lymphedema the care of these wounds is complicated by the fact that she is on Lovenox and Plavix and bleeds very frequently with even minimal trauma to these wounds. It would appear that she had a Vaseline gauze on the surface of these in the process of undressing them she bled freely from especially the right but also the left. I had to go in and use silver nitrate. The skin on her legs is significant for chronic venous stasis and lymphedema. Very dry flaking skin 07/16/2021; I actually was a patent person who saw this woman 67 y.o. 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 We use Xeroform under compression. The time she was on Lovenox and Plavix I thought I had an excellent reason for these wounds to bleed so freely. Since then the Lovenox has been stopped and should be well out of her system but she is still on Plavix. She still has very friable bleeding wounds on the left lateral left Posterior and right Posterior lower extremity.These believe very freely when we take off the dressing Also she has a left heel wound which when we saw her last time was a surgical wound however I gather we have been asked to follow this as well. I am not exactly sure at this time what her surgery was but she comes in with a wound VAC on this and they are apparently changing  it at the nursing home. 08/27/2021 upon evaluation patient appears to be doing better in regard to her wound on the legs as well as the heel. Fortunately at this point I do not see any need for a wound VAC which is good news overall I think that the Xeroform is probably a good option that is what is being utilized here as well. Fortunately I do not see any signs of active infection locally nor systemically which is great news and overall I think that the patient is making excellent progress. The area on her right gluteal area actually appears to be doing quite well and I am pleased with that. I do believe the silver alginate dressing here could be of benefit for her. 09/17/2021 upon evaluation patient appears to be doing actually better in regard to her wounds on the legs. Fortunately she does not seem to be showing any signs of worsening which is great news and overall very pleased with where we stand. I do believe that she is tolerating the dressing changes without complication which is great news. 10-08-2021 upon evaluation today patient unfortunately continues to have significant issues with her legs. Unfortunately the biggest issue that we see Currently is  that she is still having a lot of bleeding from her legs that she is also not able to move well due to her knee pain her left knee is quite significant as far as the discomfort is concerned and the right knee is getting equally as bad. Fortunately I do not see any evidence of active infection locally or systemically which is great news. Nonetheless I do believe that her heels are looking much better there is a tiny area open on the left heel the right is doing quite well. In regard to the gluteal/sacral region this appears to be healed. 10-29-2021 upon evaluation today patient unfortunately is appearing to do much worse compared to normal. She has more breakdown in regard to the left lateral leg and again I think this is pressure related. She also has more breakdown in regard to the gluteal area and she is very concerned about this in particular which is completely understandable. With that being said I do believe that she would benefit from more aggressive and appropriate offloading I discussed with her that she needs to allow the facility to change her position every 2 hours and she is can have to find different positions other than just lying on her back. Also discussed with her that obviously the facility needs to make sure to help her with repositioning every 2 hours I think both aspects of this are important. Otherwise her wound on the sacral area is going to continue to get worse and the leg will continue to get worse. 11-26-2021 upon evaluation today patient appears to be doing well with regard to her wounds all things considered. The gluteal areas are all healed which is great news. With that being said she is still having some issues here with her lower extremities. I do think that this is still something that we need to be very mindful of. Unfortunately the facility is still not washing her legs she has a lot of caked on dry skin with Xeroform we will get a try to get some of that off today.  With that being said he does have reiterated in the past the patient needs to have her legs washed regularly. 12-24-2021 upon evaluation patient's wounds are showing signs of improvement albeit slowly. Fortunately I do not see  any signs of infection locally or systemically which is great news. Nonetheless I do believe that the patient is doing quite well from the standpoint of the overall appearance of her wounds which are better than where they have been even if not where we would like them to be. 02-03-2022 upon evaluation today patient appears to be doing about the same in regard to her wounds. There is maybe a little bit of improvement in general based on what I am seeing currently but not much to be honest. I do think that the patient is making some progress here when it comes to the overall appearance of her wounds. 04-25-2022 upon evaluation patient actually is here for the first time in about 2-1/2 months. She has had multiple reasons why she did not make it part of the time she was sick and some of the times it was more of a transportation thing. Nonetheless I am glad that she is finally here good news is some of the wounds do appear to be a little bit drier and she has been using a alginate dressing at the facility which they switched her to honestly I cannot argue with the fact that it seems to be doing somewhat better in my opinion. With that being said I do believe one of the biggest issues that I see is simply that she is still having some issues here with pressure although I do not see as much of the dark discoloration as we previously noted she has been using the Prevalon offloading boots which is excellent. I want her to keep using that as I think that still good to be ideal and optimal for her. She does inquire about being able to put pressure on her heels at this point. 06-02-2022 upon evaluation today patient appears to be unfortunately still having a lot of issues with her legs. Were  not seeing a whole lot of improvement she is not as bad as she has been at some point in time but she is also not as good as we would like to see. 07-18-2022 upon evaluation today patient appears to be doing well currently in regard to her wounds on the legs all things considered. This is actually showing signs of improvement compared to where we have been. Overall I am extremely pleased in that regard. She does have a small area open on her sacral region but this is minimal at this point and actually just more of an irritation at the just the topical barrier cream is ideal here. Objective Constitutional Well-nourished and well-hydrated in no acute distress. Vitals Time Taken: 12:30 PM, Height: 65 in, Weight: 245.4 lbs, BMI: 40.8, Temperature: 97.6 F, Pulse: 67 bpm, Respiratory Rate: 16 breaths/min, Blood Pressure: 103/67 mmHg. [name], [name] ([id number]) [id number] Page 8 of 10 Respiratory normal breathing without difficulty. Psychiatric this patient is able to make decisions and demonstrates good insight into disease process. Alert and Oriented x 3. pleasant and cooperative. General Notes: Upon inspection patient's wound bed actually showed signs of good granulation epithelization at this point. She still has a lot of open wounds on the extremities and again there is a lot of friability due to the blood thinner that she is on but in general I feel like that she is actually making some improvements compared to even last time I saw her. And overall I think we are on the right track here. Integumentary (Hair, Skin) Wound #60 status is Open. Original cause of wound was Pressure Injury. The  date acquired was: 05/17/2021. The wound has been in treatment 58 weeks. The wound is located on the Right,Circumferential Lower Leg. The wound measures 20cm length x 17cm width x 0.1cm depth; 267.035cm^2 area and 26.704cm^3 volume. There is a large amount of sanguinous drainage  noted. Wound #61 status is Open. Original cause of wound was Gradually Appeared. The date acquired was: 06/11/2021. The wound has been in treatment 55 weeks. The wound is located on the Left,Posterior Lower Leg. The wound measures 1.7cm length x 2cm width x 0.12cm depth; 2.67cm^2 area and 0.32cm^3 volume. There is a large amount of serosanguineous drainage noted. Wound #62 status is Open. Original cause of wound was Gradually Appeared. The date acquired was: 06/11/2021. The wound has been in treatment 55 weeks. The wound is located on the Left,Lateral Lower Leg. The wound measures 19cm length x 9cm width x 0.1cm depth; 134.303cm^2 area and 13.43cm^3 volume. There is a large amount of sanguinous drainage noted. Wound #67 status is Open. Original cause of wound was Pressure Injury. The date acquired was: 01/13/2022. The wound has been in treatment 23 weeks. The wound is located on the Right Calcaneus. The wound measures 1cm length x 1cm width x 0.2cm depth; 0.785cm^2 area and 0.157cm^3 volume. There is a medium amount of serosanguineous drainage noted. Assessment Active Problems ICD-10 Lymphedema, not elsewhere classified Type 2 diabetes mellitus with other skin ulcer Type 2 diabetes mellitus with diabetic neuropathy, unspecified Pressure ulcer of other site, stage 3 Pressure-induced deep tissue damage of other site Pressure ulcer of sacral region, stage 3 Essential (primary) hypertension Other specified hypothyroidism Other obesity due to excess calories Plan Follow-up Appointments: Wound #60 Right,Circumferential Lower Leg: Return Appointment in 1 month Bathing/ Shower/ Hygiene: Wash wounds with antibacterial soap and water. - WASH LEGS WITH SOAP and WATER before applying dressings. No tub bath. Edema Control - Lymphedema / Segmental Compressive Device / Other: Other: - Lymphedema-use ace wraps to help control swelling. Off-Loading: Turn and reposition every 2 hours - PLEASE MAKE SURE  THIS IS COMPLETED EVERY TWO HOURS THANK YOU Other: - Put pillows under knees to keep pressure off of calves. Deep tissue injury on posterior calves. Turn on side every 1 hous to keep pressure off calves wear offloading boots Additional Orders / Instructions: Follow Nutritious Diet and Increase Protein Intake WOUND #60: - Lower Leg Wound Laterality: Right, Circumferential Cleanser: Normal Saline 3 x Per Week/30 Days Discharge Instructions: Wash your hands with soap and water. Remove old dressing, discard into plastic bag and place into trash. Cleanse the wound with Normal Saline prior to applying a clean dressing using gauze sponges, not tissues or cotton balls. Do not scrub or use excessive force. Pat dry using gauze sponges, not tissue or cotton balls. Prim Dressing: Xeroform 5x9-HBD (in/in) 3 x Per Week/30 Days ary Discharge Instructions: Apply Xeroform 5x9-HBD (in/in) as directed Secondary Dressing: ABD Pad 5x9 (in/in) 3 x Per Week/30 Days Discharge Instructions: 2. Cover with ABD pad Secured With: ACE WRAP - 51M ACE Elastic Bandage With VELCRO Brand Closure, 4 (in) 3 x Per Week/30 Days Discharge Instructions: 4. Ace wrap to secure dressing in place and control swelling. Secured With: [employer] or Non-Sterile 6-ply 4.5x4 (yd/yd) 3 x Per Week/30 Days Discharge Instructions: 3. Apply Kerlix as directed WOUND #61: - Lower Leg Wound Laterality: Left, Posterior Cleanser: Normal Saline 3 x Per Week/30 Days Discharge Instructions: Wash your hands with soap and water. Remove old dressing, discard into plastic bag and place into  trash. Cleanse the wound with Normal Saline prior to applying a clean dressing using gauze sponges, not tissues or cotton balls. Do not scrub or use excessive force. Pat dry using gauze sponges, not tissue or cotton balls. Prim Dressing: Xeroform 5x9-HBD (in/in) 3 x Per Week/30 Days ary Discharge Instructions: Apply Xeroform 5x9-HBD (in/in) as directed Secondary  Dressing: ABD Pad 5x9 (in/in) 3 x Per Week/30 Days Discharge Instructions: 2. Cover with ABD pad [name], [name] ([id number]) [id number] Page 9 of 10 Secured With: ACE WRAP - 21M ACE Elastic Bandage With VELCRO Brand Closure, 4 (in) 3 x Per Week/30 Days Discharge Instructions: 4. Ace wrap to secure dressing in place and control swelling. Secured With: [employer] or Non-Sterile 6-ply 4.5x4 (yd/yd) 3 x Per Week/30 Days Discharge Instructions: 3. Apply Kerlix as directed WOUND #62: - Lower Leg Wound Laterality: Left, Lateral Cleanser: Normal Saline 3 x Per Week/30 Days Discharge Instructions: Wash your hands with soap and water. Remove old dressing, discard into plastic bag and place into trash. Cleanse the wound with Normal Saline prior to applying a clean dressing using gauze sponges, not tissues or cotton balls. Do not scrub or use excessive force. Pat dry using gauze sponges, not tissue or cotton balls. Prim Dressing: Xeroform 5x9-HBD (in/in) 3 x Per Week/30 Days ary Discharge Instructions: Apply Xeroform 5x9-HBD (in/in) as directed Secondary Dressing: ABD Pad 5x9 (in/in) 3 x Per Week/30 Days Discharge Instructions: 2. Cover with ABD pad Secured With: ACE WRAP - 21M ACE Elastic Bandage With VELCRO Brand Closure, 4 (in) 3 x Per Week/30 Days Discharge Instructions: 4. Ace wrap to secure dressing in place and control swelling. Secured With: [employer] or Non-Sterile 6-ply 4.5x4 (yd/yd) 3 x Per Week/30 Days Discharge Instructions: 3. Apply Kerlix as directed WOUND #67: - Calcaneus Wound Laterality: Right Cleanser: Normal Saline 3 x Per Week/30 Days Discharge Instructions: Wash your hands with soap and water. Remove old dressing, discard into plastic bag and place into trash. Cleanse the wound with Normal Saline prior to applying a clean dressing using gauze sponges, not tissues or cotton balls. Do not scrub or use excessive force. Pat dry using  gauze sponges, not tissue or cotton balls. Prim Dressing: Xeroform 5x9-HBD (in/in) 3 x Per Week/30 Days ary Discharge Instructions: Apply Xeroform 5x9-HBD (in/in) as directed Secondary Dressing: ABD Pad 5x9 (in/in) 3 x Per Week/30 Days Discharge Instructions: 2. Cover with ABD pad Secured With: ACE WRAP - 21M ACE Elastic Bandage With VELCRO Brand Closure, 4 (in) 3 x Per Week/30 Days Discharge Instructions: 4. Ace wrap to secure dressing in place and control swelling. Secured With: [employer] or Non-Sterile 6-ply 4.5x4 (yd/yd) 3 x Per Week/30 Days Discharge Instructions: 3. Apply Kerlix as directed 1. I am going to recommend currently that she continue with the barrier cream to the sacral region. I am also can recommend that the patient should continue with the Xeroform gauze to the legs which I think is probably the best thing we tried alginate for a while but unfortunately it was just getting stuck. I think that keeping with the Xeroform is probably going to be the ideal way to go. 2. I am also can recommend the patient should continue with appropriate offloading this far as the sacral region is concerned as well as her legs she does have the Prevalon offloading boots. We will see patient back for reevaluation in 1 Month here in the clinic. If anything worsens or changes patient will contact our  office for additional recommendations. Electronic Signature(s) Signed: 07/18/2022 1:04:06 PM By: Worthy Keeler PA-[name] Entered By: Worthy Keeler on 07/18/2022 13:04:06 -------------------------------------------------------------------------------- SuperBill Details Patient Name: Date of Service: [name] 07/18/2022 Medical Record Number: [id number] Patient Account Number: [id number] Date of Birth/Sex: Treating RN: [birth year] (67 y.o. Drema Pry Primary Care Provider: [name] Other Clinician: Referring Provider: Treating Provider/Extender: [name] Weeks in Treatment: 58 Diagnosis Coding ICD-10 Codes Code Description I89.0 Lymphedema, not elsewhere classified E11.622 Type 2 diabetes mellitus with other skin ulcer E11.40 Type 2 diabetes mellitus with diabetic neuropathy, unspecified L89.893 Pressure ulcer of other site, stage 3 L89.896 Pressure-induced deep tissue damage of other site L89.153 Pressure ulcer of sacral region, stage 3 [name], Cicley [name] ([id number]) [id number] Page 10 of 10 I10 Essential (primary) hypertension E03.8 Other specified hypothyroidism E66.09 Other obesity due to excess calories Facility Procedures : CPT4 Code: 16073710 Description: [id number] - WOUND CARE VISIT-LEV 5 EST PT Modifier: Quantity: 1 Physician Procedures : CPT4 Code Description Modifier [id number] - WC PHYS LEVEL 3 - EST PT ICD-10 Diagnosis Description I89.0 Lymphedema, not elsewhere classified E11.622 Type 2 diabetes mellitus with other skin ulcer E11.40 Type 2 diabetes mellitus with diabetic  neuropathy, unspecified L89.893 Pressure ulcer of other site, stage 3 Quantity: 1 Electronic Signature(s) Signed: 07/18/2022 1:04:23 PM By: Worthy Keeler PA-[name] Entered By: Worthy Keeler on 07/18/2022 13:04:22

## 2022-07-19 DIAGNOSIS — M6281 Muscle weakness (generalized): Secondary | ICD-10-CM | POA: Diagnosis not present

## 2022-07-19 DIAGNOSIS — G8929 Other chronic pain: Secondary | ICD-10-CM | POA: Diagnosis not present

## 2022-07-20 DIAGNOSIS — M6281 Muscle weakness (generalized): Secondary | ICD-10-CM | POA: Diagnosis not present

## 2022-07-21 DIAGNOSIS — M6281 Muscle weakness (generalized): Secondary | ICD-10-CM | POA: Diagnosis not present

## 2022-07-25 DIAGNOSIS — M6281 Muscle weakness (generalized): Secondary | ICD-10-CM | POA: Diagnosis not present

## 2022-07-26 DIAGNOSIS — J069 Acute upper respiratory infection, unspecified: Secondary | ICD-10-CM | POA: Diagnosis not present

## 2022-07-27 DIAGNOSIS — M6281 Muscle weakness (generalized): Secondary | ICD-10-CM | POA: Diagnosis not present

## 2022-07-28 ENCOUNTER — Telehealth: Payer: Self-pay | Admitting: Family Medicine

## 2022-07-28 DIAGNOSIS — M6281 Muscle weakness (generalized): Secondary | ICD-10-CM | POA: Diagnosis not present

## 2022-07-28 NOTE — Telephone Encounter (Signed)
Copied from [place] [id number]. Topic: Medicare AWV >> Jul 28, 2022  1:37 PM Devoria Glassing wrote: Reason for CRM: Attempted to schedule AWV. Unable to LVM.  Will try at later time.

## 2022-08-02 DIAGNOSIS — M6281 Muscle weakness (generalized): Secondary | ICD-10-CM | POA: Diagnosis not present

## 2022-08-03 ENCOUNTER — Telehealth: Payer: Self-pay | Admitting: Family Medicine

## 2022-08-03 DIAGNOSIS — K769 Liver disease, unspecified: Secondary | ICD-10-CM | POA: Diagnosis not present

## 2022-08-03 DIAGNOSIS — L89613 Pressure ulcer of right heel, stage 3: Secondary | ICD-10-CM | POA: Diagnosis not present

## 2022-08-03 DIAGNOSIS — M6281 Muscle weakness (generalized): Secondary | ICD-10-CM | POA: Diagnosis not present

## 2022-08-03 DIAGNOSIS — I10 Essential (primary) hypertension: Secondary | ICD-10-CM | POA: Diagnosis not present

## 2022-08-03 DIAGNOSIS — L89893 Pressure ulcer of other site, stage 3: Secondary | ICD-10-CM | POA: Diagnosis not present

## 2022-08-03 NOTE — Telephone Encounter (Signed)
Copied from [place]. Topic: Medicare AWV >> Aug 03, 2022 10:07 AM Devoria Glassing wrote: Reason for CRM: Attempted to schedule AWV. Unable to LVM.  Will try at later time. MAILBOX FULL

## 2022-08-05 ENCOUNTER — Telehealth: Payer: Self-pay | Admitting: Family Medicine

## 2022-08-05 DIAGNOSIS — M6281 Muscle weakness (generalized): Secondary | ICD-10-CM | POA: Diagnosis not present

## 2022-08-05 NOTE — Telephone Encounter (Signed)
Copied from [place]. Topic: Medicare AWV >> Aug 05, 2022  2:02 PM Devoria Glassing wrote: Reason for CRM: Attempted to schedule AWV. Unable to LVM.  Will try at later time.

## 2022-08-10 ENCOUNTER — Telehealth: Payer: Self-pay | Admitting: *Deleted

## 2022-08-10 DIAGNOSIS — M6281 Muscle weakness (generalized): Secondary | ICD-10-CM | POA: Diagnosis not present

## 2022-08-10 NOTE — Patient Outreach (Signed)
  Care Coordination   Initial Visit Note   08/10/2022 Name: [name] MRN: [id number] DOB: March 23, 1956  [name] is a 67 y.o. year old female who sees Autumn Patch, MD for primary care.   Upon chart review, noted that patient has been at [facility] since October.  Call placed to facility, confirmed patient remains resident currently.  Voice message left with Neoma Laming, admissions coordinator, to confirm she will remain for long term care.    SDOH assessments and interventions completed:  No     Care Coordination Interventions:  No, not indicated   Follow up plan: No further intervention required.   Encounter Outcome:  Pt. Visit Completed   Valente David, RN, MSN, [place] Care Management Care Management Coordinator [phone]

## 2022-08-11 DIAGNOSIS — M6281 Muscle weakness (generalized): Secondary | ICD-10-CM | POA: Diagnosis not present

## 2022-08-15 ENCOUNTER — Encounter: Payer: Medicare HMO | Attending: Physician Assistant | Admitting: Physician Assistant

## 2022-08-15 DIAGNOSIS — L89153 Pressure ulcer of sacral region, stage 3: Secondary | ICD-10-CM | POA: Insufficient documentation

## 2022-08-15 DIAGNOSIS — E668 Other obesity: Secondary | ICD-10-CM | POA: Diagnosis not present

## 2022-08-15 DIAGNOSIS — E1122 Type 2 diabetes mellitus with diabetic chronic kidney disease: Secondary | ICD-10-CM | POA: Insufficient documentation

## 2022-08-15 DIAGNOSIS — R531 Weakness: Secondary | ICD-10-CM | POA: Diagnosis not present

## 2022-08-15 DIAGNOSIS — L97222 Non-pressure chronic ulcer of left calf with fat layer exposed: Secondary | ICD-10-CM | POA: Diagnosis not present

## 2022-08-15 DIAGNOSIS — Z743 Need for continuous supervision: Secondary | ICD-10-CM | POA: Diagnosis not present

## 2022-08-15 DIAGNOSIS — N186 End stage renal disease: Secondary | ICD-10-CM | POA: Insufficient documentation

## 2022-08-15 DIAGNOSIS — L89893 Pressure ulcer of other site, stage 3: Secondary | ICD-10-CM | POA: Insufficient documentation

## 2022-08-15 DIAGNOSIS — L89610 Pressure ulcer of right heel, unstageable: Secondary | ICD-10-CM | POA: Diagnosis not present

## 2022-08-15 DIAGNOSIS — R29898 Other symptoms and signs involving the musculoskeletal system: Secondary | ICD-10-CM | POA: Diagnosis not present

## 2022-08-15 DIAGNOSIS — E11622 Type 2 diabetes mellitus with other skin ulcer: Secondary | ICD-10-CM | POA: Diagnosis not present

## 2022-08-15 DIAGNOSIS — E038 Other specified hypothyroidism: Secondary | ICD-10-CM | POA: Insufficient documentation

## 2022-08-15 DIAGNOSIS — I120 Hypertensive chronic kidney disease with stage 5 chronic kidney disease or end stage renal disease: Secondary | ICD-10-CM | POA: Diagnosis not present

## 2022-08-15 DIAGNOSIS — E1140 Type 2 diabetes mellitus with diabetic neuropathy, unspecified: Secondary | ICD-10-CM | POA: Diagnosis not present

## 2022-08-15 DIAGNOSIS — I890 Lymphedema, not elsewhere classified: Secondary | ICD-10-CM | POA: Diagnosis not present

## 2022-08-15 DIAGNOSIS — E1151 Type 2 diabetes mellitus with diabetic peripheral angiopathy without gangrene: Secondary | ICD-10-CM | POA: Insufficient documentation

## 2022-08-15 DIAGNOSIS — Z7401 Bed confinement status: Secondary | ICD-10-CM | POA: Diagnosis not present

## 2022-08-15 DIAGNOSIS — Z7902 Long term (current) use of antithrombotics/antiplatelets: Secondary | ICD-10-CM | POA: Diagnosis not present

## 2022-08-15 DIAGNOSIS — L97822 Non-pressure chronic ulcer of other part of left lower leg with fat layer exposed: Secondary | ICD-10-CM | POA: Diagnosis not present

## 2022-08-15 DIAGNOSIS — J302 Other seasonal allergic rhinitis: Secondary | ICD-10-CM | POA: Diagnosis not present

## 2022-08-15 DIAGNOSIS — Z7901 Long term (current) use of anticoagulants: Secondary | ICD-10-CM | POA: Diagnosis not present

## 2022-08-15 DIAGNOSIS — L89896 Pressure-induced deep tissue damage of other site: Secondary | ICD-10-CM | POA: Insufficient documentation

## 2022-08-15 DIAGNOSIS — W19XXXA Unspecified fall, initial encounter: Secondary | ICD-10-CM | POA: Diagnosis not present

## 2022-08-15 DIAGNOSIS — R5381 Other malaise: Secondary | ICD-10-CM | POA: Diagnosis not present

## 2022-08-15 NOTE — Progress Notes (Signed)
Autumn, [name] ([id number]) [id number] Page 1 of 10 Visit Report for 08/15/2022 Chief Complaint Document Details Patient Name: Date of Service: [name], [name] 08/15/2022 11:00 A M Medical Record Number: [id number] Patient Account Number: [id number] Date of Birth/Sex: Treating RN: 21-Nov-1955 (67 y.o. 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 New sacral wound as of 10/29/21 Electronic Signature(s) Signed: 08/15/2022 11:01:39 AM By: Worthy Keeler PA-C Entered By: Worthy Keeler on 08/15/2022 11:01:38 -------------------------------------------------------------------------------- HPI Details Patient Name: Date of Service: [name]. 08/15/2022 11:00 A M Medical Record Number: [id number] Patient Account Number: [id number] Date of Birth/Sex: Treating RN: May 12, 1956 (67 y.o. [name] Primary Care Provider: [name] Other Clinician: Massie Kluver Referring Provider: Treating Provider/Extender: [name] Weeks in Treatment: 23 History of Present Illness HPI Description: 67yo w/ h/o BLE phlebolymphedema, type 2 DM (unknown hemoglobin A1c), and obesity. No PVD. h/o chronic, recurrent BLE calf ulcers. Treated with 4 layer compression. Infrequently uses lymphedema pump. Bilateral lower extremity ulcerations healed as of June 2016. Fitted for custom compression stockings but did not receive them. Patient could not travel for lymphedema consult. She developed recurrent bilateral lower extremity ulcerations in August 2016. She is able to heal these quite quickly with 4 layer elastic compression bandages. However, she is not very compliant using her juxtalite  compression garments and tends to develop recurrent ulcerations while using juxtalite. Unna boots were applied this past week. She tolerated this well but performs minimal ambulation. She is scheduled to undergo total hip replacement tomorrow with Dr. Rudene Christians. She is unsure if he is aware of her ulcerations. She is otherwise without complaints. No significant pain. No fever or chills. Moderate clear drainage. Not on any antibiotics. 06/04/2015 -- she did have her hip surgery and has now been in rehabilitation and they're controlling her fluid intake and diuretics to the extent that she has lost 13 pounds of weight and her lower extremity circumference is decreased by 7 cm. 06/18/2015 -- she is still in the rehabilitation facility and continues to be looked after well and has lost a total of 15 pounds and her diabetes is under much better control. [name], [name] ([id number]) [id number] Page 2 of 10 Readmission: 06/03/2021 upon evaluation today patient actually appears to be doing somewhat poorly in regard to a wound on the right posterior lower leg. Patient does have a history of previously having been seen here in the clinic but this predates my time here back in 2017. Subsequently unfortunately right now she is having a significant issue with bleeding in the posterior of her right calf this appears to be a deep tissue injury as developed into an ulceration. She does have a history significant for lymphedema, diabetes mellitus type 2, diabetic neuropathy, hypertension, hypothyroidism, and obesity. She is currently in a skilled nursing facility. The dressing that she had in place during evaluation today was Xeroform followed by significant amount of gauze and this was severely stuck. Upon removal the patient was having significant bleeding she also had significant work done in the hospital in regard to her blood flow and Dr. Lucky Cowboy does look like he has her on 2 blood  thinners Plavix and Lovenox. This is probably part of the reason why she is bleeding so significantly. With that being said I do believe  that based on what we are seeing currently this is probably can to be something that were getting have to try to get stopped with more than just pressure to the region currently. I think surgery foam is probably can to be necessary. If we can get the stop of the Surgifoam she is probably can have to go to the hospital to try to get this bleeding stopped. 12/30; this is a patient who is currently in a skilled facility. She has wounds on her bilateral calfs likely secondary to severe chronic venous insufficiency and lymphedema the care of these wounds is complicated by the fact that she is on Lovenox and Plavix and bleeds very frequently with even minimal trauma to these wounds. It would appear that she had a Vaseline gauze on the surface of these in the process of undressing them she bled freely from especially the right but also the left. I had to go in and use silver nitrate. The skin on her legs is significant for chronic venous stasis and lymphedema. Very dry flaking skin 07/16/2021; I actually was a patent person who saw this woman 3 weeks ago. She is a nursing facility has chronic venous wounds and bilateral lower extremity wounds. We use Xeroform under compression. The time she was on Lovenox and Plavix I thought I had an excellent reason for these wounds to bleed so freely. Since then the Lovenox has been stopped and should be well out of her system but she is still on Plavix. She still has very friable bleeding wounds on the left lateral left Posterior and right Posterior lower extremity.These believe very freely when we take off the dressing Also she has a left heel wound which when we saw her last time was a surgical wound however I gather we have been asked to follow this as well. I am not exactly sure at this time what her surgery was but she comes in with  a wound VAC on this and they are apparently changing it at the nursing home. 08/27/2021 upon evaluation patient appears to be doing better in regard to her wound on the legs as well as the heel. Fortunately at this point I do not see any need for a wound VAC which is good news overall I think that the Xeroform is probably a good option that is what is being utilized here as well. Fortunately I do not see any signs of active infection locally nor systemically which is great news and overall I think that the patient is making excellent progress. The area on her right gluteal area actually appears to be doing quite well and I am pleased with that. I do believe the silver alginate dressing here could be of benefit for her. 09/17/2021 upon evaluation patient appears to be doing actually better in regard to her wounds on the legs. Fortunately she does not seem to be showing any signs of worsening which is great news and overall very pleased with where we stand. I do believe that she is tolerating the dressing changes without complication which is great news. 10-08-2021 upon evaluation today patient unfortunately continues to have significant issues with her legs. Unfortunately the biggest issue that we see Currently is that she is still having a lot of bleeding from her legs that she is also not able to move well due to her knee pain her left knee is quite significant as far as the discomfort is concerned and the right knee is getting equally as bad. Fortunately  I do not see any evidence of active infection locally or systemically which is great news. Nonetheless I do believe that her heels are looking much better there is a tiny area open on the left heel the right is doing quite well. In regard to the gluteal/sacral region this appears to be healed. 10-29-2021 upon evaluation today patient unfortunately is appearing to do much worse compared to normal. She has more breakdown in regard to the left lateral leg and  again I think this is pressure related. She also has more breakdown in regard to the gluteal area and she is very concerned about this in particular which is completely understandable. With that being said I do believe that she would benefit from more aggressive and appropriate offloading I discussed with her that she needs to allow the facility to change her position every 2 hours and she is can have to find different positions other than just lying on her back. Also discussed with her that obviously the facility needs to make sure to help her with repositioning every 2 hours I think both aspects of this are important. Otherwise her wound on the sacral area is going to continue to get worse and the leg will continue to get worse. 11-26-2021 upon evaluation today patient appears to be doing well with regard to her wounds all things considered. The gluteal areas are all healed which is great news. With that being said she is still having some issues here with her lower extremities. I do think that this is still something that we need to be very mindful of. Unfortunately the facility is still not washing her legs she has a lot of caked on dry skin with Xeroform we will get a try to get some of that off today. With that being said he does have reiterated in the past the patient needs to have her legs washed regularly. 12-24-2021 upon evaluation patient's wounds are showing signs of improvement albeit slowly. Fortunately I do not see any signs of infection locally or systemically which is great news. Nonetheless I do believe that the patient is doing quite well from the standpoint of the overall appearance of her wounds which are better than where they have been even if not where we would like them to be. 02-03-2022 upon evaluation today patient appears to be doing about the same in regard to her wounds. There is maybe a little bit of improvement in general based on what I am seeing currently but not much to be  honest. I do think that the patient is making some progress here when it comes to the overall appearance of her wounds. 04-25-2022 upon evaluation patient actually is here for the first time in about 2-1/2 months. She has had multiple reasons why she did not make it part of the time she was sick and some of the times it was more of a transportation thing. Nonetheless I am glad that she is finally here good news is some of the wounds do appear to be a little bit drier and she has been using a alginate dressing at the facility which they switched her to honestly I cannot argue with the fact that it seems to be doing somewhat better in my opinion. With that being said I do believe one of the biggest issues that I see is simply that she is still having some issues here with pressure although I do not see as much of the dark discoloration as we previously noted she  has been using the Prevalon offloading boots which is excellent. I want her to keep using that as I think that still good to be ideal and optimal for her. She does inquire about being able to put pressure on her heels at this point. 06-02-2022 upon evaluation today patient appears to be unfortunately still having a lot of issues with her legs. Were not seeing a whole lot of improvement she is not as bad as she has been at some point in time but she is also not as good as we would like to see. 07-18-2022 upon evaluation today patient appears to be doing well currently in regard to her wounds on the legs all things considered. This is actually showing signs of improvement compared to where we have been. Overall I am extremely pleased in that regard. She does have a small area open on her sacral region but this is minimal at this point and actually just more of an irritation at the just the topical barrier cream is ideal here. 08-15-2022 upon evaluation today patient appears to be doing well currently in regard to her wounds all things considered. This  is actually one of the better times that I have seen her as far as the overall appearance is concerned I think her heel is doing better she really does not allow me to do much with that as it hurts so badly. Her legs also doing quite well although extremely hyper granulated with the Xeroform gauze. I think it would be worthwhile trying the Sorbact to see if this could be beneficial she does not remember doing this in the past. I think it could prevent it from sticking and then subsequently could also help pull away some of the drainage which will be helpful as well. Electronic Signature(s) Signed: 08/15/2022 1:44:03 PM By: Worthy Keeler PA-C Entered By: Worthy Keeler on 08/15/2022 13:44:03 [name] ([id number]) [id number] Page 3 of 10 -------------------------------------------------------------------------------- Physical Exam Details Patient Name: Date of Service: [name], [place]. 08/15/2022 11:00 A M Medical Record Number: [id number] Patient Account Number: [id number] Date of Birth/Sex: Treating RN: [birth year] (67 y.o. [name] Primary Care Provider: [name] Other Clinician: Massie Kluver Referring Provider: Treating Provider/Extender: [name] Weeks in Treatment: 34 Constitutional Obese and well-hydrated in no acute distress. Respiratory normal breathing without difficulty. Psychiatric this patient is able to make decisions and demonstrates good insight into disease process. Alert and Oriented x 3. pleasant and cooperative. Notes Upon inspection patient's wound bed actually showed signs of good granulation and epithelization at this point. Fortunately I do not see any evidence of infection which is good news. Nonetheless I do believe that she would benefit from possibly switching to Sorbact organ to do AandD ointment over the legs first followed by applying the Sorbact to the open areas. Electronic  Signature(s) Signed: 08/15/2022 1:44:27 PM By: Worthy Keeler PA-C Entered By: Worthy Keeler on 08/15/2022 13:44:27 -------------------------------------------------------------------------------- Physician Orders Details Patient Name: Date of Service: [name]. 08/15/2022 11:00 A M Medical Record Number: [id number] Patient Account Number: [id number] Date of Birth/Sex: Treating RN: 04/15/1956 (66 y.o. [name] Primary Care Provider: [name] Other Clinician: Massie Kluver Referring Provider: Treating Provider/Extender: [name] Weeks in Treatment: 12 Verbal / Phone Orders: No Diagnosis Coding ICD-10 Coding Code Description I89.0 Lymphedema, not elsewhere classified E11.622 Type 2 diabetes mellitus with other skin ulcer E11.40 Type 2 diabetes mellitus with diabetic neuropathy, unspecified L89.893 Pressure  ulcer of other site, stage 3 L89.896 Pressure-induced deep tissue damage of other site CIELLA, MUNSCH ([id number]) [id number] Page 4 of 10 L89.153 Pressure ulcer of sacral region, stage 3 I10 Essential (primary) hypertension E03.8 Other specified hypothyroidism E66.09 Other obesity due to excess calories Follow-up Appointments Wound #60 Right,Circumferential Lower Leg Return Appointment in 1 month Bathing/ Shower/ Hygiene Wash wounds with antibacterial soap and water. - WASH LEGS WITH SOAP and WATER before applying dressings. No tub bath. Edema Control - Lymphedema / Segmental Compressive Device / Other Other: - Lymphedema-use ace wraps to help control swelling. Off-Loading Turn and reposition every 2 hours - PLEASE MAKE SURE THIS IS COMPLETED EVERY TWO HOURS THANK YOU Other: - Put pillows under knees to keep pressure off of calves. Deep tissue injury on posterior calves. Turn on side every 1 hous to keep pressure off calves wear offloading boots Additional Orders / Instructions Follow Nutritious Diet and  Increase Protein Intake Wound Treatment Wound #60 - Lower Leg Wound Laterality: Right, Circumferential Cleanser: Normal Saline 3 x Per Week/30 Days Discharge Instructions: Wash your hands with soap and water. Remove old dressing, discard into plastic bag and place into trash. Cleanse the wound with Normal Saline prior to applying a clean dressing using gauze sponges, not tissues or cotton balls. Do not scrub or use excessive force. Pat dry using gauze sponges, not tissue or cotton balls. Peri-Wound Care: AandD Ointment 3 x Per Week/30 Days Discharge Instructions: Apply AandD Ointment to entire lower legs including wounds Prim Dressing: Cutimed Sorbact 1.5x 2.38 (in/in) 3 x Per Week/30 Days ary Discharge Instructions: A bacteria- and fungi binding wound dressing, suitable for cavities and fistulas. It is suitable as a wound filler and allows the passage of wound exudate into a secondary dressing. The dressing helps reducing odor and pain and can improve healing. Secondary Dressing: ABD Pad 5x9 (in/in) 3 x Per Week/30 Days Discharge Instructions: 2. Cover with ABD pad Secured With: ACE WRAP - 89M ACE Elastic Bandage With VELCRO Brand Closure, 4 (in) 3 x Per Week/30 Days Discharge Instructions: 4. Ace wrap to secure dressing in place and control swelling. Secured With: [employer] or Non-Sterile 6-ply 4.5x4 (yd/yd) 3 x Per Week/30 Days Discharge Instructions: 3. Apply Kerlix as directed Wound #61 - Lower Leg Wound Laterality: Left, Posterior Cleanser: Normal Saline 3 x Per Week/30 Days Discharge Instructions: Wash your hands with soap and water. Remove old dressing, discard into plastic bag and place into trash. Cleanse the wound with Normal Saline prior to applying a clean dressing using gauze sponges, not tissues or cotton balls. Do not scrub or use excessive force. Pat dry using gauze sponges, not tissue or cotton balls. Peri-Wound Care: AandD Ointment 3 x Per Week/30 Days Discharge  Instructions: Apply AandD Ointment to entire lower legs including wounds Prim Dressing: Cutimed Sorbact 1.5x 2.38 (in/in) 3 x Per Week/30 Days ary Discharge Instructions: A bacteria- and fungi binding wound dressing, suitable for cavities and fistulas. It is suitable as a wound filler and allows the passage of wound exudate into a secondary dressing. The dressing helps reducing odor and pain and can improve healing. Secondary Dressing: ABD Pad 5x9 (in/in) 3 x Per Week/30 Days Discharge Instructions: 2. Cover with ABD pad Secured With: ACE WRAP - 89M ACE Elastic Bandage With VELCRO Brand Closure, 4 (in) 3 x Per Week/30 Days Discharge Instructions: 4. Ace wrap to secure dressing in place and control swelling. Secured With: [employer] or Non-Sterile 6-ply 4.5x4 (yd/yd)  3 x Per Week/30 Days Discharge Instructions: 3. Apply Kerlix as directed Wound #62 - Lower Leg Wound Laterality: Left, Lateral Cleanser: Normal Saline 3 x Per Week/30 Days Discharge Instructions: Wash your hands with soap and water. Remove old dressing, discard into plastic bag and place into trash. Cleanse the wound with Normal Saline prior to applying a clean dressing using gauze sponges, not tissues or cotton balls. Do not scrub or use excessive force. Pat dry using gauze sponges, not tissue or cotton balls. Peri-Wound Care: AandD Ointment 3 x Per Week/30 Days [name], [name] ([id number]) [phone].pdf Page 5 of 10 Discharge Instructions: Apply AandD Ointment to entire lower legs including wounds Prim Dressing: Cutimed Sorbact 1.5x 2.38 (in/in) 3 x Per Week/30 Days ary Discharge Instructions: A bacteria- and fungi binding wound dressing, suitable for cavities and fistulas. It is suitable as a wound filler and allows the passage of wound exudate into a secondary dressing. The dressing helps reducing odor and pain and can improve healing. Secondary Dressing: ABD Pad 5x9 (in/in) 3 x Per Week/30  Days Discharge Instructions: 2. Cover with ABD pad Secured With: ACE WRAP - 58M ACE Elastic Bandage With VELCRO Brand Closure, 4 (in) 3 x Per Week/30 Days Discharge Instructions: 4. Ace wrap to secure dressing in place and control swelling. Secured With: [employer] or Non-Sterile 6-ply 4.5x4 (yd/yd) 3 x Per Week/30 Days Discharge Instructions: 3. Apply Kerlix as directed Wound #67 - Calcaneus Wound Laterality: Right Peri-Wound Care: AandD Ointment 3 x Per Week/30 Days Discharge Instructions: Apply AandD Ointment to entire lower legs including wounds Prim Dressing: Cutimed Sorbact 1.5x 2.38 (in/in) 3 x Per Week/30 Days ary Discharge Instructions: A bacteria- and fungi binding wound dressing, suitable for cavities and fistulas. It is suitable as a wound filler and allows the passage of wound exudate into a secondary dressing. The dressing helps reducing odor and pain and can improve healing. Secondary Dressing: ABD Pad 5x9 (in/in) 3 x Per Week/30 Days Discharge Instructions: 2. Cover with ABD pad Secured With: ACE WRAP - 58M ACE Elastic Bandage With VELCRO Brand Closure, 4 (in) 3 x Per Week/30 Days Discharge Instructions: 4. Ace wrap to secure dressing in place and control swelling. Secured With: [employer] or Non-Sterile 6-ply 4.5x4 (yd/yd) 3 x Per Week/30 Days Discharge Instructions: 3. Apply Kerlix as directed Electronic Signature(s) Signed: 08/15/2022 1:48:29 PM By: Worthy Keeler PA-C Signed: 08/15/2022 4:23:47 PM By: Massie Kluver Entered By: Massie Kluver on 08/15/2022 12:02:54 -------------------------------------------------------------------------------- Problem List Details Patient Name: Date of Service: [name]. 08/15/2022 11:00 A M Medical Record Number: [id number] Patient Account Number: [id number] Date of Birth/Sex: Treating RN: October 24, 1955 (67 y.o. [name] Primary Care Provider: [name] Other Clinician: Massie Kluver Referring  Provider: Treating Provider/Extender: [name] Weeks in Treatment: 70 Active Problems ICD-10 Encounter Code Description Active Date MDM Diagnosis I89.0 Lymphedema, not elsewhere classified 06/03/2021 No Yes E11.622 Type 2 diabetes mellitus with other skin ulcer 06/03/2021 No Yes MURTIS, TEN [name] ([id number]) [id number] Page 6 of 10 E11.40 Type 2 diabetes mellitus with diabetic neuropathy, unspecified 06/03/2021 No Yes L89.893 Pressure ulcer of other site, stage 3 06/03/2021 No Yes L89.896 Pressure-induced deep tissue damage of other site 06/03/2021 No Yes L89.153 Pressure ulcer of sacral region, stage 3 08/27/2021 No Yes I10 Essential (primary) hypertension 06/03/2021 No Yes E03.8 Other specified hypothyroidism 06/03/2021 No Yes E66.09 Other obesity due to excess calories 06/03/2021 No Yes Inactive Problems Resolved Problems Electronic Signature(s) Signed: 08/15/2022  11:01:35 AM By: Worthy Keeler PA-C Entered By: Worthy Keeler on 08/15/2022 11:01:35 -------------------------------------------------------------------------------- Progress Note Details Patient Name: Date of Service: [name], [place]. 08/15/2022 11:00 A M Medical Record Number: [id number] Patient Account Number: [id number] Date of Birth/Sex: Treating RN: [birth year] (67 y.o. 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 New sacral wound as of 10/29/21 History of Present Illness (HPI) 67yo w/ h/o BLE phlebolymphedema, type 2 DM (unknown hemoglobin A1c), and obesity. No PVD. h/o chronic, recurrent BLE calf ulcers. Treated with 4 layer compression. Infrequently uses lymphedema pump. Bilateral lower extremity  ulcerations healed as of June 2016. Fitted for custom compression stockings but did not receive them. Patient could not travel for lymphedema consult. She developed recurrent bilateral lower extremity ulcerations in August 2016. She is able to heal these quite quickly with 4 layer elastic compression bandages. However, she is not very compliant using her juxtalite compression garments and tends to develop recurrent ulcerations while using juxtalite. Unna boots were applied this past week. She tolerated this well but performs minimal ambulation. She is scheduled to undergo total hip replacement tomorrow with Dr. Rudene Christians. She is unsure if he is aware of her ulcerations. [name], [name] ([id number]) [id number] Page 7 of 10 She is otherwise without complaints. No significant pain. No fever or chills. Moderate clear drainage. Not on any antibiotics. 06/04/2015 -- she did have her hip surgery and has now been in rehabilitation and they're controlling her fluid intake and diuretics to the extent that she has lost 13 pounds of weight and her lower extremity circumference is decreased by 7 cm. 06/18/2015 -- she is still in the rehabilitation facility and continues to be looked after well and has lost a total of 15 pounds and her diabetes is under much better control. Readmission: 06/03/2021 upon evaluation today patient actually appears to be doing somewhat poorly in regard to a wound on the right posterior lower leg. Patient does have a history of previously having been seen here in the clinic but this predates my time here back in 2017. Subsequently unfortunately right now she is having a significant issue with bleeding in the posterior of her right calf this appears to be a deep tissue injury as developed into an ulceration. She does have a history significant for lymphedema, diabetes mellitus type 2, diabetic neuropathy, hypertension, hypothyroidism, and obesity. She  is currently in a skilled nursing facility. The dressing that she had in place during evaluation today was Xeroform followed by significant amount of gauze and this was severely stuck. Upon removal the patient was having significant bleeding she also had significant work done in the hospital in regard to her blood flow and Dr. Lucky Cowboy does look like he has her on 2 blood thinners Plavix and Lovenox. This is probably part of the reason why she is bleeding so significantly. With that being said I do believe that based on what we are seeing currently this is probably can to be something that were getting have to try to get stopped with more than just pressure to the region currently. I think surgery foam is probably can to be necessary. If we can get the stop of the Surgifoam she is probably can have to go to the hospital to try to get this bleeding stopped. 12/30; this is a  patient who is currently in a skilled facility. She has wounds on her bilateral calfs likely secondary to severe chronic venous insufficiency and lymphedema the care of these wounds is complicated by the fact that she is on Lovenox and Plavix and bleeds very frequently with even minimal trauma to these wounds. It would appear that she had a Vaseline gauze on the surface of these in the process of undressing them she bled freely from especially the right but also the left. I had to go in and use silver nitrate. The skin on her legs is significant for chronic venous stasis and lymphedema. Very dry flaking skin 07/16/2021; I actually was a patent person who saw this woman 3 weeks ago. She is a nursing facility has chronic venous wounds and bilateral lower extremity wounds. We use Xeroform under compression. The time she was on Lovenox and Plavix I thought I had an excellent reason for these wounds to bleed so freely. Since then the Lovenox has been stopped and should be well out of her system but she is still on Plavix. She still has very  friable bleeding wounds on the left lateral left Posterior and right Posterior lower extremity.These believe very freely when we take off the dressing Also she has a left heel wound which when we saw her last time was a surgical wound however I gather we have been asked to follow this as well. I am not exactly sure at this time what her surgery was but she comes in with a wound VAC on this and they are apparently changing it at the nursing home. 08/27/2021 upon evaluation patient appears to be doing better in regard to her wound on the legs as well as the heel. Fortunately at this point I do not see any need for a wound VAC which is good news overall I think that the Xeroform is probably a good option that is what is being utilized here as well. Fortunately I do not see any signs of active infection locally nor systemically which is great news and overall I think that the patient is making excellent progress. The area on her right gluteal area actually appears to be doing quite well and I am pleased with that. I do believe the silver alginate dressing here could be of benefit for her. 09/17/2021 upon evaluation patient appears to be doing actually better in regard to her wounds on the legs. Fortunately she does not seem to be showing any signs of worsening which is great news and overall very pleased with where we stand. I do believe that she is tolerating the dressing changes without complication which is great news. 10-08-2021 upon evaluation today patient unfortunately continues to have significant issues with her legs. Unfortunately the biggest issue that we see Currently is that she is still having a lot of bleeding from her legs that she is also not able to move well due to her knee pain her left knee is quite significant as far as the discomfort is concerned and the right knee is getting equally as bad. Fortunately I do not see any evidence of active infection locally or systemically which is great  news. Nonetheless I do believe that her heels are looking much better there is a tiny area open on the left heel the right is doing quite well. In regard to the gluteal/sacral region this appears to be healed. 10-29-2021 upon evaluation today patient unfortunately is appearing to do much worse compared to normal. She has more  breakdown in regard to the left lateral leg and again I think this is pressure related. She also has more breakdown in regard to the gluteal area and she is very concerned about this in particular which is completely understandable. With that being said I do believe that she would benefit from more aggressive and appropriate offloading I discussed with her that she needs to allow the facility to change her position every 2 hours and she is can have to find different positions other than just lying on her back. Also discussed with her that obviously the facility needs to make sure to help her with repositioning every 2 hours I think both aspects of this are important. Otherwise her wound on the sacral area is going to continue to get worse and the leg will continue to get worse. 11-26-2021 upon evaluation today patient appears to be doing well with regard to her wounds all things considered. The gluteal areas are all healed which is great news. With that being said she is still having some issues here with her lower extremities. I do think that this is still something that we need to be very mindful of. Unfortunately the facility is still not washing her legs she has a lot of caked on dry skin with Xeroform we will get a try to get some of that off today. With that being said he does have reiterated in the past the patient needs to have her legs washed regularly. 12-24-2021 upon evaluation patient's wounds are showing signs of improvement albeit slowly. Fortunately I do not see any signs of infection locally or systemically which is great news. Nonetheless I do believe that the patient  is doing quite well from the standpoint of the overall appearance of her wounds which are better than where they have been even if not where we would like them to be. 02-03-2022 upon evaluation today patient appears to be doing about the same in regard to her wounds. There is maybe a little bit of improvement in general based on what I am seeing currently but not much to be honest. I do think that the patient is making some progress here when it comes to the overall appearance of her wounds. 04-25-2022 upon evaluation patient actually is here for the first time in about 2-1/2 months. She has had multiple reasons why she did not make it part of the time she was sick and some of the times it was more of a transportation thing. Nonetheless I am glad that she is finally here good news is some of the wounds do appear to be a little bit drier and she has been using a alginate dressing at the facility which they switched her to honestly I cannot argue with the fact that it seems to be doing somewhat better in my opinion. With that being said I do believe one of the biggest issues that I see is simply that she is still having some issues here with pressure although I do not see as much of the dark discoloration as we previously noted she has been using the Prevalon offloading boots which is excellent. I want her to keep using that as I think that still good to be ideal and optimal for her. She does inquire about being able to put pressure on her heels at this point. 06-02-2022 upon evaluation today patient appears to be unfortunately still having a lot of issues with her legs. Were not seeing a whole lot of improvement she is  not as bad as she has been at some point in time but she is also not as good as we would like to see. 07-18-2022 upon evaluation today patient appears to be doing well currently in regard to her wounds on the legs all things considered. This is actually showing signs of improvement compared  to where we have been. Overall I am extremely pleased in that regard. She does have a small area open on her sacral region but this is minimal at this point and actually just more of an irritation at the just the topical barrier cream is ideal here. 08-15-2022 upon evaluation today patient appears to be doing well currently in regard to her wounds all things considered. This is actually one of the better times that I have seen her as far as the overall appearance is concerned I think her heel is doing better she really does not allow me to do much with that as it hurts so badly. Her legs also doing quite well although extremely hyper granulated with the Xeroform gauze. I think it would be worthwhile trying the Sorbact to see if this could be beneficial she does not remember doing this in the past. I think it could prevent it from sticking and then subsequently could also help pull away [name], [name] ([id number]) [id number] Page 8 of 10 some of the drainage which will be helpful as well. Objective Constitutional Obese and well-hydrated in no acute distress. Vitals Time Taken: 11:02 AM, Height: 65 in, Weight: 245.4 lbs, BMI: 40.8, Temperature: 98.2 F, Pulse: 76 bpm, Respiratory Rate: 16 breaths/min, Blood Pressure: 114/64 mmHg. Respiratory normal breathing without difficulty. Psychiatric this patient is able to make decisions and demonstrates good insight into disease process. Alert and Oriented x 3. pleasant and cooperative. General Notes: Upon inspection patient's wound bed actually showed signs of good granulation and epithelization at this point. Fortunately I do not see any evidence of infection which is good news. Nonetheless I do believe that she would benefit from possibly switching to Sorbact organ to do AandD ointment over the legs first followed by applying the Sorbact to the open areas. Integumentary (Hair, Skin) Wound #60 status is Open. Original  cause of wound was Pressure Injury. The date acquired was: 05/17/2021. The wound has been in treatment 62 weeks. The wound is located on the Right,Circumferential Lower Leg. The wound measures 24.5cm length x 25.3cm width x 0.1cm depth; 486.829cm^2 area and 48.683cm^3 volume. There is a large amount of sanguinous drainage noted. There is small (1-33%) red granulation within the wound bed. There is a small (1- 33%) amount of necrotic tissue within the wound bed including Adherent Slough. Wound #61 status is Open. Original cause of wound was Gradually Appeared. The date acquired was: 06/11/2021. The wound has been in treatment 59 weeks. The wound is located on the Left,Posterior Lower Leg. The wound measures 14cm length x 8cm width x 0.1cm depth; 87.965cm^2 area and 8.796cm^3 volume. There is Fat Layer (Subcutaneous Tissue) exposed. There is a large amount of serosanguineous drainage noted. There is medium (34-66%) red granulation within the wound bed. There is a small (1-33%) amount of necrotic tissue within the wound bed including Adherent Slough. Wound #62 status is Open. Original cause of wound was Gradually Appeared. The date acquired was: 06/11/2021. The wound has been in treatment 59 weeks. The wound is located on the Left,Lateral Lower Leg. The wound measures 17.5cm length x 10.5cm width x 0.1cm depth; 144.317cm^2 area and 14.432cm^3 volume. There  is Fat Layer (Subcutaneous Tissue) exposed. There is a large amount of sanguinous drainage noted. There is small (1-33%) red, pale granulation within the wound bed. There is a small (1-33%) amount of necrotic tissue within the wound bed including Adherent Slough. Wound #67 status is Open. Original cause of wound was Pressure Injury. The date acquired was: 01/13/2022. The wound has been in treatment 27 weeks. The wound is located on the Right Calcaneus. The wound measures 0.1cm length x 0.1cm width x 0.1cm depth; 0.008cm^2 area and 0.001cm^3 volume. There  is a medium amount of serosanguineous drainage noted. Assessment Active Problems ICD-10 Lymphedema, not elsewhere classified Type 2 diabetes mellitus with other skin ulcer Type 2 diabetes mellitus with diabetic neuropathy, unspecified Pressure ulcer of other site, stage 3 Pressure-induced deep tissue damage of other site Pressure ulcer of sacral region, stage 3 Essential (primary) hypertension Other specified hypothyroidism Other obesity due to excess calories Plan Follow-up Appointments: Wound #60 Right,Circumferential Lower Leg: Return Appointment in 1 month Bathing/ Shower/ Hygiene: Wash wounds with antibacterial soap and water. - WASH LEGS WITH SOAP and WATER before applying dressings. No tub bath. Edema Control - Lymphedema / Segmental Compressive Device / Other: Other: - Lymphedema-use ace wraps to help control swelling. Off-Loading: Turn and reposition every 2 hours - PLEASE MAKE SURE THIS IS COMPLETED EVERY TWO HOURS THANK YOU Other: - Put pillows under knees to keep pressure off of calves. Deep tissue injury on posterior calves. Turn on side every 1 hous to keep pressure off calves wear offloading boots Additional Orders / InstructionsELLAYNA, [name] ([id number]) [phone].pdf Page 9 of 10 Follow Nutritious Diet and Increase Protein Intake WOUND #60: - Lower Leg Wound Laterality: Right, Circumferential Cleanser: Normal Saline 3 x Per Week/30 Days Discharge Instructions: Wash your hands with soap and water. Remove old dressing, discard into plastic bag and place into trash. Cleanse the wound with Normal Saline prior to applying a clean dressing using gauze sponges, not tissues or cotton balls. Do not scrub or use excessive force. Pat dry using gauze sponges, not tissue or cotton balls. Peri-Wound Care: AandD Ointment 3 x Per Week/30 Days Discharge Instructions: Apply AandD Ointment to entire lower legs including wounds Prim Dressing: Cutimed  Sorbact 1.5x 2.38 (in/in) 3 x Per Week/30 Days ary Discharge Instructions: A bacteria- and fungi binding wound dressing, suitable for cavities and fistulas. It is suitable as a wound filler and allows the passage of wound exudate into a secondary dressing. The dressing helps reducing odor and pain and can improve healing. Secondary Dressing: ABD Pad 5x9 (in/in) 3 x Per Week/30 Days Discharge Instructions: 2. Cover with ABD pad Secured With: ACE WRAP - 50M ACE Elastic Bandage With VELCRO Brand Closure, 4 (in) 3 x Per Week/30 Days Discharge Instructions: 4. Ace wrap to secure dressing in place and control swelling. Secured With: [employer] or Non-Sterile 6-ply 4.5x4 (yd/yd) 3 x Per Week/30 Days Discharge Instructions: 3. Apply Kerlix as directed WOUND #61: - Lower Leg Wound Laterality: Left, Posterior Cleanser: Normal Saline 3 x Per Week/30 Days Discharge Instructions: Wash your hands with soap and water. Remove old dressing, discard into plastic bag and place into trash. Cleanse the wound with Normal Saline prior to applying a clean dressing using gauze sponges, not tissues or cotton balls. Do not scrub or use excessive force. Pat dry using gauze sponges, not tissue or cotton balls. Peri-Wound Care: AandD Ointment 3 x Per Week/30 Days Discharge Instructions: Apply AandD Ointment to entire lower legs  including wounds Prim Dressing: Cutimed Sorbact 1.5x 2.38 (in/in) 3 x Per Week/30 Days ary Discharge Instructions: A bacteria- and fungi binding wound dressing, suitable for cavities and fistulas. It is suitable as a wound filler and allows the passage of wound exudate into a secondary dressing. The dressing helps reducing odor and pain and can improve healing. Secondary Dressing: ABD Pad 5x9 (in/in) 3 x Per Week/30 Days Discharge Instructions: 2. Cover with ABD pad Secured With: ACE WRAP - 44M ACE Elastic Bandage With VELCRO Brand Closure, 4 (in) 3 x Per Week/30 Days Discharge Instructions:  4. Ace wrap to secure dressing in place and control swelling. Secured With: [employer] or Non-Sterile 6-ply 4.5x4 (yd/yd) 3 x Per Week/30 Days Discharge Instructions: 3. Apply Kerlix as directed WOUND #62: - Lower Leg Wound Laterality: Left, Lateral Cleanser: Normal Saline 3 x Per Week/30 Days Discharge Instructions: Wash your hands with soap and water. Remove old dressing, discard into plastic bag and place into trash. Cleanse the wound with Normal Saline prior to applying a clean dressing using gauze sponges, not tissues or cotton balls. Do not scrub or use excessive force. Pat dry using gauze sponges, not tissue or cotton balls. Peri-Wound Care: AandD Ointment 3 x Per Week/30 Days Discharge Instructions: Apply AandD Ointment to entire lower legs including wounds Prim Dressing: Cutimed Sorbact 1.5x 2.38 (in/in) 3 x Per Week/30 Days ary Discharge Instructions: A bacteria- and fungi binding wound dressing, suitable for cavities and fistulas. It is suitable as a wound filler and allows the passage of wound exudate into a secondary dressing. The dressing helps reducing odor and pain and can improve healing. Secondary Dressing: ABD Pad 5x9 (in/in) 3 x Per Week/30 Days Discharge Instructions: 2. Cover with ABD pad Secured With: ACE WRAP - 44M ACE Elastic Bandage With VELCRO Brand Closure, 4 (in) 3 x Per Week/30 Days Discharge Instructions: 4. Ace wrap to secure dressing in place and control swelling. Secured With: [employer] or Non-Sterile 6-ply 4.5x4 (yd/yd) 3 x Per Week/30 Days Discharge Instructions: 3. Apply Kerlix as directed WOUND #67: - Calcaneus Wound Laterality: Right Peri-Wound Care: AandD Ointment 3 x Per Week/30 Days Discharge Instructions: Apply AandD Ointment to entire lower legs including wounds Prim Dressing: Cutimed Sorbact 1.5x 2.38 (in/in) 3 x Per Week/30 Days ary Discharge Instructions: A bacteria- and fungi binding wound dressing, suitable for cavities and  fistulas. It is suitable as a wound filler and allows the passage of wound exudate into a secondary dressing. The dressing helps reducing odor and pain and can improve healing. Secondary Dressing: ABD Pad 5x9 (in/in) 3 x Per Week/30 Days Discharge Instructions: 2. Cover with ABD pad Secured With: ACE WRAP - 44M ACE Elastic Bandage With VELCRO Brand Closure, 4 (in) 3 x Per Week/30 Days Discharge Instructions: 4. Ace wrap to secure dressing in place and control swelling. Secured With: [employer] or Non-Sterile 6-ply 4.5x4 (yd/yd) 3 x Per Week/30 Days Discharge Instructions: 3. Apply Kerlix as directed 1. At this time again recommend that we switch to Sorbact proceeded by applying AandD ointment to both legs initially. My hope is that this will help keep the excess drainage off of the legs and prevent some of the increased hypergranulation were seen in regard to the wound beds. Patient is in agreement with that plan. 2. I am also going to recommend that we have the patient continue with the roll gauze and Ace wrap's after applying ABD pads to cover. 3. I am also going  to suggest that we continue to recommend changing this 3 times per week which I think is still going to be a good option here. I discussed with the patient the plan she is in agreement to give this a trial. We will see patient back for reevaluation in 3 weeks here in the clinic. If anything worsens or changes patient will contact our office for additional recommendations. Electronic Signature(s) Signed: 08/15/2022 1:45:23 PM By: Worthy Keeler PA-C Entered By: Worthy Keeler on 08/15/2022 13:45:23 [name] ([id number]) [id number] Page 10 of 10 -------------------------------------------------------------------------------- SuperBill Details Patient Name: Date of Service: [name], [name] 08/15/2022 Medical Record Number: [id number] Patient Account Number: [id number] Date of  Birth/Sex: Treating RN: Nov 18, 1955 (67 y.o. [name], Kim Primary Care Provider: [name] Other Clinician: Massie Kluver Referring Provider: Treating Provider/Extender: [name] Weeks in Treatment: 62 Diagnosis Coding ICD-10 Codes Code Description I89.0 Lymphedema, not elsewhere classified E11.622 Type 2 diabetes mellitus with other skin ulcer E11.40 Type 2 diabetes mellitus with diabetic neuropathy, unspecified L89.893 Pressure ulcer of other site, stage 3 L89.896 Pressure-induced deep tissue damage of other site L89.153 Pressure ulcer of sacral region, stage 3 I10 Essential (primary) hypertension E03.8 Other specified hypothyroidism E66.09 Other obesity due to excess calories Facility Procedures : CPT4 Code: [id number] Description: [facility] VISIT-LEV 5 EST PT Modifier: Quantity: 1 Physician Procedures : CPT4 Code Description Modifier [id number] - WC PHYS LEVEL 3 - EST PT ICD-10 Diagnosis Description I89.0 Lymphedema, not elsewhere classified E11.622 Type 2 diabetes mellitus with other skin ulcer E11.40 Type 2 diabetes mellitus with diabetic  neuropathy, unspecified L89.893 Pressure ulcer of other site, stage 3 Quantity: 1 Electronic Signature(s) Signed: 08/15/2022 1:45:51 PM By: Worthy Keeler PA-C Entered By: Worthy Keeler on 08/15/2022 13:45:50

## 2022-08-16 NOTE — Progress Notes (Signed)
[name] ([id number]) [phone] Page 1 of 14 Visit Report for 08/15/2022 Arrival Information Details Patient Name: Date of Service: [name], [name] 08/15/2022 11:00 A M Medical Record Number: [id number] Patient Account Number: [id number] Date of Birth/Sex: Treating RN: Oct 14, 1955 (67 y.o. [name] Primary Care Brandun Pinn: [name] Other Clinician: Massie Kluver Referring Akita Maxim: Treating [name]/Extender: Oletta [name] Weeks in Treatment: 52 Visit Information History Since Last Visit All ordered tests and consults were completed: No Patient Arrived: Stretcher Added or deleted any medications: No Arrival Time: 10:55 Any new allergies or adverse reactions: No Transfer Assistance: Stretcher Had a fall or experienced change in No Patient Identification Verified: Yes activities of daily living that may affect Secondary Verification Process Completed: Yes risk of falls: Patient Requires Transmission-Based Precautions: No Signs or symptoms of abuse/neglect since last visito No Patient Has Alerts: Yes Hospitalized since last visit: No Patient Alerts: Patient on Blood Thinner Implantable device outside of the clinic excluding No TYPE II Diabetic cellular tissue based products placed in the center Plavix and Lovenox since last visit: [place] in Place as Prescribed: Yes Pain Present Now: No Electronic Signature(s) Signed: 08/15/2022 4:23:47 PM By: Massie Kluver Entered By: Massie Kluver on 08/15/2022 10:58:50 -------------------------------------------------------------------------------- Clinic Level of Care Assessment Details Patient Name: Date of Service: [name], [name] 08/15/2022 11:00 A M Medical Record Number: [id number] Patient Account Number: [id number] Date of Birth/Sex: Treating RN: February 22, 1956 (67 y.o. [name] Primary Care [name]: [name] Other Clinician: Massie Kluver Referring [name]: Treating Rogue Pautler/Extender: Oletta [name] Weeks in Treatment: 6 Clinic Level of Care Assessment Items TOOL 4 Quantity Score []$  - 0 Use when only an EandM is performed on FOLLOW-UP visit ASSESSMENTS - Nursing Assessment / Reassessment X- 1 10 Reassessment of Co-morbidities (includes updates in patient status) X- 1 5 Reassessment of Adherence to Treatment Plan MACELYNN, [name] ([id number]) [phone].pdf Page 2 of 14 ASSESSMENTS - Wound and Skin A ssessment / Reassessment []$  - 0 Simple Wound Assessment / Reassessment - one wound X- 4 5 Complex Wound Assessment / Reassessment - multiple wounds []$  - 0 Dermatologic / Skin Assessment (not related to wound area) ASSESSMENTS - Focused Assessment []$  - 0 Circumferential Edema Measurements - multi extremities []$  - 0 Nutritional Assessment / Counseling / Intervention []$  - 0 Lower Extremity Assessment (monofilament, tuning fork, pulses) []$  - 0 Peripheral Arterial Disease Assessment (using hand held doppler) ASSESSMENTS - Ostomy and/or Continence Assessment and Care []$  - 0 Incontinence Assessment and Management []$  - 0 Ostomy Care Assessment and Management (repouching, etc.) PROCESS - Coordination of Care X - Simple Patient / Family Education for ongoing care 1 15 []$  - 0 Complex (extensive) Patient / Family Education for ongoing care []$  - 0 Staff obtains Programmer, systems, Records, T Results / Process Orders est []$  - 0 Staff telephones HHA, Nursing Homes / Clarify orders / etc []$  - 0 Routine Transfer to another Facility (non-emergent condition) []$  - 0 Routine Hospital Admission (non-emergent condition) []$  - 0 New Admissions / Biomedical engineer / Ordering NPWT Apligraf, etc. , []$  - 0 Emergency Hospital Admission (emergent condition) X- 1 10 Simple Discharge Coordination []$  - 0 Complex (extensive) Discharge Coordination PROCESS - Special Needs []$  - 0 Pediatric /  Minor Patient Management []$  - 0 Isolation Patient Management []$  - 0 Hearing / Language / Visual special needs []$  - 0 Assessment of Community assistance (transportation, D/C planning, etc.) []$  - 0 Additional assistance /  Altered mentation []$  - 0 Support Surface(s) Assessment (bed, cushion, seat, etc.) INTERVENTIONS - Wound Cleansing / Measurement []$  - 0 Simple Wound Cleansing - one wound X- 4 5 Complex Wound Cleansing - multiple wounds X- 1 5 Wound Imaging (photographs - any number of wounds) []$  - 0 Wound Tracing (instead of photographs) []$  - 0 Simple Wound Measurement - one wound X- 4 5 Complex Wound Measurement - multiple wounds INTERVENTIONS - Wound Dressings []$  - 0 Small Wound Dressing one or multiple wounds X- 4 15 Medium Wound Dressing one or multiple wounds []$  - 0 Large Wound Dressing one or multiple wounds X- 1 5 Application of Medications - topical []$  - 0 Application of Medications - injection INTERVENTIONS - Miscellaneous []$  - 0 External ear exam Autumn, [name] ([id number].pdf Page 3 of 14 []$  - 0 Specimen Collection (cultures, biopsies, blood, body fluids, etc.) []$  - 0 Specimen(s) / Culture(s) sent or taken to Lab for analysis []$  - 0 Patient Transfer (multiple staff / Harrel Lemon Lift / Similar devices) []$  - 0 Simple Staple / Suture removal (25 or less) []$  - 0 Complex Staple / Suture removal (26 or more) []$  - 0 Hypo / Hyperglycemic Management (close monitor of Blood Glucose) []$  - 0 Ankle / Brachial Index (ABI) - do not check if billed separately X- 1 5 Vital Signs Has the patient been seen at the hospital within the last three years: Yes Total Score: 175 Level Of Care: New/Established - Level 5 Electronic Signature(s) Signed: 08/15/2022 4:23:47 PM By: Massie Kluver Entered By: Massie Kluver on 08/15/2022 12:04:21 -------------------------------------------------------------------------------- Encounter Discharge  Information Details Patient Name: Date of Service: [name] Asal. 08/15/2022 11:00 A M Medical Record Number: [id number] Patient Account Number: [id number] Date of Birth/Sex: Treating RN: [birth year] (67 y.o. Charolette [name], [name] Primary Care Rondrick Barreira: [name] Other Clinician: Massie Kluver Referring Nadia Torr: Treating Ilyaas Musto/Extender: Oletta [name] Weeks in Treatment: 22 Encounter Discharge Information Items Discharge Condition: Stable Ambulatory Status: Stretcher Discharge Destination: Skilled Nursing Facility Orders Sent: Yes Transportation: Ambulance Accompanied By: self Schedule Follow-up Appointment: Yes Clinical Summary of Care: Electronic Signature(s) Signed: 08/15/2022 4:23:47 PM By: Massie Kluver Entered By: Massie Kluver on 08/15/2022 12:08:35 Lower Extremity Assessment Details -------------------------------------------------------------------------------- [name] ([id number]) [id number] Page 4 of 14 Patient Name: Date of Service: GAYANE, TUGMAN 08/15/2022 11:00 A M Medical Record Number: [id number] Patient Account Number: [id number] Date of Birth/Sex: Treating RN: March 22, 1956 (67 y.o. [name] Primary Care Shakeita Vandevander: [name] Other Clinician: Massie Kluver Referring Kesean Serviss: Treating Devone Tousley/Extender: Oletta [name] Weeks in Treatment: 74 Electronic Signature(s) Signed: 08/15/2022 1:35:06 PM By: Gretta Cool BSN, RN, CWS, Kim RN, BSN Signed: 08/15/2022 4:23:47 PM By: Massie Kluver Entered By: Massie Kluver on 08/15/2022 11:28:14 -------------------------------------------------------------------------------- Multi Wound Chart Details Patient Name: Date of Service: [name] Asal. 08/15/2022 11:00 A M Medical Record Number: [id number] Patient Account Number: [id number] Date of Birth/Sex: Treating RN: Nov 08, 1955 (67 y.o. [name] Primary Care Maymuna Detzel: [name] Other  Clinician: Massie Kluver Referring Steffanie Mingle: Treating Chelly Dombeck/Extender: Oletta [name] Weeks in Treatment: 71 Vital Signs Height(in): 65 Pulse(bpm): 76 Weight(lbs): 245.4 Blood Pressure(mmHg): 114/64 Body Mass Index(BMI): 40.8 Temperature(F): 98.2 Respiratory Rate(breaths/min): 16 [60:Photos:] Right, Circumferential Lower Leg Left, Posterior Lower Leg Left, Lateral Lower Leg Wound Location: Pressure Injury Gradually Appeared Gradually Appeared Wounding Event: Pressure Ulcer Lymphedema Lymphedema Primary Etiology: Lymphedema, Hypertension, Lymphedema, Hypertension, Lymphedema, Hypertension, Comorbid History: Peripheral Arterial Disease, Peripheral Peripheral Arterial Disease, Peripheral Peripheral Arterial Disease,  Peripheral Venous Disease, Type II Diabetes, Venous Disease, Type II Diabetes, Venous Disease, Type II Diabetes, End Stage Renal Disease, End Stage Renal Disease, End Stage Renal Disease, Osteoarthritis, Neuropathy Osteoarthritis, Neuropathy Osteoarthritis, Neuropathy 05/17/2021 06/11/2021 06/11/2021 Date Acquired: 62 59 59 Weeks of Treatment: Open Open Open Wound Status: No No No Wound Recurrence: 24.5x25.3x0.1 14x8x0.1 17.5x10.5x0.1 Measurements L x W x D (cm) 486.829 87.965 144.317 A (cm) : rea 48.683 8.796 14.432 Volume (cm) : -86064.40% -176.50% -15335.00% % Reduction in Area: -85308.80% -176.50% -15418.30% % Reduction in Volume: Category/Stage III Full Thickness With Exposed Support Full Thickness Without Exposed Classification: Structures Support Structures Large Large Large Exudate Amount: Sanguinous Serosanguineous Sanguinous Exudate Type: red red, brown red Exudate Color: Small (1-33%) Medium (34-66%) Small (1-33%) Granulation Amount: Red Red Red, Pale Granulation QualityATLANTIS, BINI ([id number]) [phone].pdf Page 5 of 14 Small (1-33%) Small (1-33%) Small (1-33%) Necrotic A mount: Small  (1-33%) None None Epithelialization: Wound Number: 48 N/A N/A Photos: N/A N/A Right Calcaneus N/A N/A Wound Location: Pressure Injury N/A N/A Wounding Event: Pressure Ulcer N/A N/A Primary Etiology: Lymphedema, Hypertension, N/A N/A Comorbid History: Peripheral Arterial Disease, Peripheral Venous Disease, Type II Diabetes, End Stage Renal Disease, Osteoarthritis, Neuropathy 01/13/2022 N/A N/A Date Acquired: 71 N/A N/A Weeks of Treatment: Open N/A N/A Wound Status: No N/A N/A Wound Recurrence: 0.1x0.1x0.1 N/A N/A Measurements L x W x D (cm) 0.008 N/A N/A A (cm) : rea 0.001 N/A N/A Volume (cm) : 99.70% N/A N/A % Reduction in A rea: 99.60% N/A N/A % Reduction in Volume: Category/Stage II N/A N/A Classification: Medium N/A N/A Exudate A mount: Serosanguineous N/A N/A Exudate Type: red, brown N/A N/A Exudate Color: N/A N/A N/A Granulation A mount: N/A N/A N/A Granulation Quality: N/A N/A N/A Necrotic A mount: N/A N/A N/A Exposed Structures: N/A N/A N/A Epithelialization: Treatment Notes Electronic Signature(s) Signed: 08/15/2022 4:23:47 PM By: Massie Kluver Entered By: Massie Kluver on 08/15/2022 11:35:36 -------------------------------------------------------------------------------- Multi-Disciplinary Care Plan Details Patient Name: Date of Service: [name] Asal. 08/15/2022 11:00 A M Medical Record Number: [id number] Patient Account Number: [id number] Date of Birth/Sex: Treating RN: May 21, 1956 (67 y.o. [name] Primary Care Soleia Badolato: [name] Other Clinician: Massie Kluver Referring Clemence Lengyel: Treating Maniyah Moller/Extender: Oletta [name] Weeks in Treatment: 64 Active Inactive Necrotic Tissue Nursing Diagnoses: Impaired tissue integrity related to necrotic/devitalized tissue AKYRAH, CHRISTEN ([id number]) [phone].pdf Page 6 of 14 Knowledge deficit related to management of necrotic/devitalized  tissue Goals: Necrotic/devitalized tissue will be minimized in the wound bed Date Initiated: 11/26/2021 Target Resolution Date: 11/26/2021 Goal Status: Active Patient/caregiver will verbalize understanding of reason and process for debridement of necrotic tissue Date Initiated: 11/26/2021 Target Resolution Date: 11/26/2021 Goal Status: Active Interventions: Assess patient pain level pre-, during and post procedure and prior to discharge Provide education on necrotic tissue and debridement process Treatment Activities: Apply topical anesthetic as ordered : 11/26/2021 Notes: Venous Leg Ulcer Nursing Diagnoses: Actual venous Insuffiency (use after diagnosis is confirmed) Knowledge deficit related to disease process and management Potential for venous Insuffiency (use before diagnosis confirmed) Goals: Patient will maintain optimal edema control Date Initiated: 11/26/2021 Target Resolution Date: 11/26/2021 Goal Status: Active Interventions: Assess peripheral edema status every visit. Notes: Wound/Skin Impairment Nursing Diagnoses: Impaired tissue integrity Knowledge deficit related to smoking impact on wound healing Knowledge deficit related to ulceration/compromised skin integrity Goals: Patient/caregiver will verbalize understanding of skin care regimen Date Initiated: 06/03/2021 Target Resolution Date: 06/03/2021 Goal Status: Active Interventions: Assess ulceration(s) every visit Provide education  on ulcer and skin care Treatment Activities: Skin care regimen initiated : 06/03/2021 Topical wound management initiated : 06/03/2021 Notes: Electronic Signature(s) Signed: 08/15/2022 1:35:06 PM By: Gretta Cool, BSN, RN, CWS, Kim RN, BSN Signed: 08/15/2022 4:23:47 PM By: Massie Kluver Entered By: Massie Kluver on 08/15/2022 12:07:11 [name] ([id number]) [id number] Page 7 of  14 -------------------------------------------------------------------------------- Pain Assessment Details Patient Name: Date of Service: MAYSIE, SPINOLA. 08/15/2022 11:00 A M Medical Record Number: [id number] Patient Account Number: [id number] Date of Birth/Sex: Treating RN: Aug 13, 1955 (67 y.o. [name] Primary Care Wilber Fini: [name] Other Clinician: Massie Kluver Referring Miyani Cronic: Treating Nekesha Font/Extender: Oletta [name] Weeks in Treatment: 72 Active Problems Location of Pain Severity and Description of Pain Patient Has Paino No Site Locations Pain Management and Medication Current Pain Management: Electronic Signature(s) Signed: 08/15/2022 1:35:06 PM By: Gretta Cool, BSN, RN, CWS, Kim RN, BSN Signed: 08/15/2022 4:23:47 PM By: Massie Kluver Entered By: Massie Kluver on 08/15/2022 11:04:16 -------------------------------------------------------------------------------- Patient/Caregiver Education Details Patient Name: Date of Service: Morand, [name] LYN K. 2/19/2024andnbsp11:00 A M Medical Record Number: [id number] Patient Account Number: [id number] Date of Birth/Gender: Treating RN: Oct 03, 1955 (67 y.o. [name] Primary Care Physician: [name] Other Clinician: Massie Kluver Referring Physician: Treating Physician/Extender: Oletta [name] Weeks in Treatment: 68 Education Assessment Education Provided To: Patient Education Topics Provided Wound/Skin Impairment: Handouts: Other: continue wound care as directed Methods: Explain/Verbal MAISEE, KANAK ([id number]) [phone].pdf Page 8 of 14 Responses: State content correctly Electronic Signature(s) Signed: 08/15/2022 4:23:47 PM By: Massie Kluver Entered By: Massie Kluver on 08/15/2022 12:07:37 -------------------------------------------------------------------------------- Wound Assessment Details Patient Name: Date of Service: [name] Asal.  08/15/2022 11:00 A M Medical Record Number: [id number] Patient Account Number: [id number] Date of Birth/Sex: Treating RN: [birth year] (67 y.o. Charolette [name], [name] Primary Care Ermel Verne: [name] Other Clinician: Massie Kluver Referring Reyan Helle: Treating Reather Steller/Extender: Oletta [name] Weeks in Treatment: 62 Wound Status Wound Number: 60 Primary Pressure Ulcer Etiology: Wound Location: Right, Circumferential Lower Leg Wound Open Wounding Event: Pressure Injury Status: Date Acquired: 05/17/2021 Comorbid Lymphedema, Hypertension, Peripheral Arterial Disease, Peripheral Weeks Of Treatment: 62 History: Venous Disease, Type II Diabetes, End Stage Renal Disease, Clustered Wound: No Osteoarthritis, Neuropathy Photos Wound Measurements Length: (cm) 24.5 Width: (cm) 25.3 Depth: (cm) 0.1 Area: (cm) 486.829 Volume: (cm) 48.683 % Reduction in Area: -86064.4% % Reduction in Volume: -85308.8% Epithelialization: Small (1-33%) Wound Description Classification: Category/Stage III Exudate Amount: Large Exudate Type: Sanguinous Exudate Color: red Foul Odor After Cleansing: No Slough/Fibrino Yes Wound Bed Granulation Amount: Small (1-33%) Granulation Quality: Red Necrotic Amount: Small (1-33%) Necrotic Quality: [address] ULLANDA, CLOWNEY ([id number]) [id number] Page 9 of 14 Wound #60 (Lower Leg) Wound Laterality: Right, Circumferential Cleanser Normal Saline Discharge Instruction: Wash your hands with soap and water. Remove old dressing, discard into plastic bag and place into trash. Cleanse the wound with Normal Saline prior to applying a clean dressing using gauze sponges, not tissues or cotton balls. Do not scrub or use excessive force. Pat dry using gauze sponges, not tissue or cotton balls. Peri-Wound Care AandD Ointment Discharge Instruction: Apply AandD Ointment to entire lower legs including wounds Topical Primary  Dressing Cutimed Sorbact 1.5x 2.38 (in/in) Discharge Instruction: A bacteria- and fungi binding wound dressing, suitable for cavities and fistulas. It is suitable as a wound filler and allows the passage of wound exudate into a secondary dressing. The dressing helps reducing odor and pain and can improve healing. Secondary Dressing ABD Pad 5x9 (  in/in) Discharge Instruction: 2. Cover with ABD pad Secured With ACE WRAP - 67M ACE Elastic Bandage With VELCRO Brand Closure, 4 (in) Discharge Instruction: 4. Ace wrap to secure dressing in place and control swelling. Kerlix Roll Sterile or Non-Sterile 6-ply 4.5x4 (yd/yd) Discharge Instruction: 3. Apply Kerlix as directed Compression Wrap Compression Stockings Add-Ons Electronic Signature(s) Signed: 08/15/2022 1:35:06 PM By: Gretta Cool, BSN, RN, CWS, Kim RN, BSN Signed: 08/15/2022 4:23:47 PM By: Massie Kluver Entered By: Massie Kluver on 08/15/2022 11:30:33 -------------------------------------------------------------------------------- Wound Assessment Details Patient Name: Date of Service: [name] Asal. 08/15/2022 11:00 A M Medical Record Number: [id number] Patient Account Number: [id number] Date of Birth/Sex: Treating RN: 27-May-1956 (67 y.o. [name] Primary Care Sabin Gibeault: [name] Other Clinician: Massie Kluver Referring Tiffane Sheldon: Treating Tam Savoia/Extender: Oletta [name] Weeks in Treatment: 62 Wound Status Wound Number: 61 Primary Lymphedema Etiology: Wound Location: Left, Posterior Lower Leg Wound Open Wounding Event: Gradually Appeared Status: Date Acquired: 06/11/2021 Comorbid Lymphedema, Hypertension, Peripheral Arterial Disease, Peripheral Weeks Of Treatment: 59 History: Venous Disease, Type II Diabetes, End Stage Renal Disease, Clustered Wound: No Osteoarthritis, Neuropathy Photos GENE, NISKA ([id number]) [phone].pdf Page 10 of 14 Wound Measurements Length: (cm)  14 Width: (cm) 8 Depth: (cm) 0.1 Area: (cm) 87.965 Volume: (cm) 8.796 % Reduction in Area: -176.5% % Reduction in Volume: -176.5% Epithelialization: None Wound Description Classification: Full Thickness With Exposed Support Structures Exudate Amount: Large Exudate Type: Serosanguineous Exudate Color: red, brown Foul Odor After Cleansing: No Slough/Fibrino Yes Wound Bed Granulation Amount: Medium (34-66%) Exposed Structure Granulation Quality: Red Fat Layer (Subcutaneous Tissue) Exposed: Yes Necrotic Amount: Small (1-33%) Necrotic Quality: Adherent Slough Treatment Notes Wound #61 (Lower Leg) Wound Laterality: Left, Posterior Cleanser Normal Saline Discharge Instruction: Wash your hands with soap and water. Remove old dressing, discard into plastic bag and place into trash. Cleanse the wound with Normal Saline prior to applying a clean dressing using gauze sponges, not tissues or cotton balls. Do not scrub or use excessive force. Pat dry using gauze sponges, not tissue or cotton balls. Peri-Wound Care AandD Ointment Discharge Instruction: Apply AandD Ointment to entire lower legs including wounds Topical Primary Dressing Cutimed Sorbact 1.5x 2.38 (in/in) Discharge Instruction: A bacteria- and fungi binding wound dressing, suitable for cavities and fistulas. It is suitable as a wound filler and allows the passage of wound exudate into a secondary dressing. The dressing helps reducing odor and pain and can improve healing. Secondary Dressing ABD Pad 5x9 (in/in) Discharge Instruction: 2. Cover with ABD pad Secured With ACE WRAP - 67M ACE Elastic Bandage With VELCRO Brand Closure, 4 (in) Discharge Instruction: 4. Ace wrap to secure dressing in place and control swelling. Kerlix Roll Sterile or Non-Sterile 6-ply 4.5x4 (yd/yd) Discharge Instruction: 3. Apply Kerlix as directed Compression Wrap Compression Stockings Add-Ons Electronic Signature(s) Signed: 08/15/2022 1:35:06 PM  By: Gretta Cool, BSN, RN, CWS, Kim RN, BSN Signed: 08/15/2022 4:23:47 PM By: Massie Kluver Entered By: Massie Kluver on 08/15/2022 11:31:04 [name] ([id number].pdf Page 11 of 14 -------------------------------------------------------------------------------- Wound Assessment Details Patient Name: Date of Service: KADYNCE, ION. 08/15/2022 11:00 A M Medical Record Number: [id number] Patient Account Number: [id number] Date of Birth/Sex: Treating RN: [birth year] (67 y.o. [name] Primary Care Jessicia Napolitano: [name] Other Clinician: Massie Kluver Referring Cydnie Deason: Treating Vanderbilt Ranieri/Extender: Oletta [name] Weeks in Treatment: 62 Wound Status Wound Number: 62 Primary Lymphedema Etiology: Wound Location: Left, Lateral Lower Leg Wound Open Wounding Event: Gradually Appeared Status: Date Acquired: 06/11/2021 Comorbid Lymphedema,  Hypertension, Peripheral Arterial Disease, Peripheral Weeks Of Treatment: 59 History: Venous Disease, Type II Diabetes, End Stage Renal Disease, Clustered Wound: No Osteoarthritis, Neuropathy Photos Wound Measurements Length: (cm) 17.5 Width: (cm) 10.5 Depth: (cm) 0.1 Area: (cm) 144.317 Volume: (cm) 14.432 % Reduction in Area: -15335% % Reduction in Volume: -15418.3% Epithelialization: None Wound Description Classification: Full Thickness Without Exposed Suppor Exudate Amount: Large Exudate Type: Sanguinous Exudate Color: red t Structures Wound Bed Granulation Amount: Small (1-33%) Exposed Structure Granulation Quality: Red, Pale Fat Layer (Subcutaneous Tissue) Exposed: Yes Necrotic Amount: Small (1-33%) Necrotic Quality: Adherent Slough Treatment Notes Wound #62 (Lower Leg) Wound Laterality: Left, Lateral Cleanser Normal Saline Discharge Instruction: Wash your hands with soap and water. Remove old dressing, discard into plastic bag and place into trash. Cleanse the wound with  Normal Saline prior to applying a clean dressing using gauze sponges, not tissues or cotton balls. Do not scrub or use excessive force. Pat dry using gauze sponges, not tissue or cotton balls. NINNIE, PERKOWSKI ([id number]) [phone].pdf Page 12 of [place] Ointment Discharge Instruction: Apply AandD Ointment to entire lower legs including wounds Topical Primary Dressing Cutimed Sorbact 1.5x 2.38 (in/in) Discharge Instruction: A bacteria- and fungi binding wound dressing, suitable for cavities and fistulas. It is suitable as a wound filler and allows the passage of wound exudate into a secondary dressing. The dressing helps reducing odor and pain and can improve healing. Secondary Dressing ABD Pad 5x9 (in/in) Discharge Instruction: 2. Cover with ABD pad Secured With ACE WRAP - 35M ACE Elastic Bandage With VELCRO Brand Closure, 4 (in) Discharge Instruction: 4. Ace wrap to secure dressing in place and control swelling. Kerlix Roll Sterile or Non-Sterile 6-ply 4.5x4 (yd/yd) Discharge Instruction: 3. Apply Kerlix as directed Compression Wrap Compression Stockings Add-Ons Electronic Signature(s) Signed: 08/15/2022 1:35:06 PM By: Gretta Cool, BSN, RN, CWS, Kim RN, BSN Signed: 08/15/2022 4:23:47 PM By: Massie Kluver Entered By: Massie Kluver on 08/15/2022 11:31:30 -------------------------------------------------------------------------------- Wound Assessment Details Patient Name: Date of Service: [name] Asal. 08/15/2022 11:00 A M Medical Record Number: [id number] Patient Account Number: [id number] Date of Birth/Sex: Treating RN: 25-Dec-1955 (67 y.o. [name] Primary Care Kahley Leib: [name] Other Clinician: Massie Kluver Referring Dalesha Stanback: Treating Francess Mullen/Extender: Oletta [name] Weeks in Treatment: 62 Wound Status Wound Number: [id number] Primary Pressure Ulcer Etiology: Wound Location: Right Calcaneus Wound Open Wounding  Event: Pressure Injury Status: Date Acquired: 01/13/2022 Comorbid Lymphedema, Hypertension, Peripheral Arterial Disease, Peripheral Weeks Of Treatment: 27 History: Venous Disease, Type II Diabetes, End Stage Renal Disease, Clustered Wound: No Osteoarthritis, Neuropathy Photos [name], [name] ([id number]) [phone].pdf Page 13 of 14 Wound Measurements Length: (cm) 0.1 Width: (cm) 0.1 Depth: (cm) 0.1 Area: (cm) 0.008 Volume: (cm) 0.001 % Reduction in Area: 99.7% % Reduction in Volume: 99.6% Wound Description Classification: Category/Stage II Exudate Amount: Medium Exudate Type: Serosanguineous Exudate Color: red, brown Treatment Notes Wound #67 (Calcaneus) Wound Laterality: Right Cleanser Peri-Wound Care AandD Ointment Discharge Instruction: Apply AandD Ointment to entire lower legs including wounds Topical Primary Dressing Cutimed Sorbact 1.5x 2.38 (in/in) Discharge Instruction: A bacteria- and fungi binding wound dressing, suitable for cavities and fistulas. It is suitable as a wound filler and allows the passage of wound exudate into a secondary dressing. The dressing helps reducing odor and pain and can improve healing. Secondary Dressing ABD Pad 5x9 (in/in) Discharge Instruction: 2. Cover with ABD pad Secured With ACE WRAP - 35M ACE Elastic Bandage With VELCRO Brand Closure, 4 (in) Discharge Instruction: 4. Ace  wrap to secure dressing in place and control swelling. Kerlix Roll Sterile or Non-Sterile 6-ply 4.5x4 (yd/yd) Discharge Instruction: 3. Apply Kerlix as directed Compression Wrap Compression Stockings Add-Ons Electronic Signature(s) Signed: 08/15/2022 1:35:06 PM By: Gretta Cool, BSN, RN, CWS, Kim RN, BSN Signed: 08/15/2022 4:23:47 PM By: Massie Kluver Entered By: Massie Kluver on 08/15/2022 11:32:02 [name] ([id number]) [id number] Page 14 of  14 -------------------------------------------------------------------------------- Vitals Details Patient Name: Date of Service: [name], ROSEKRANS. 08/15/2022 11:00 A M Medical Record Number: [id number] Patient Account Number: [id number] Date of Birth/Sex: Treating RN: [birth year] (67 y.o. Charolette [name], [name] Primary Care Kasy Iannacone: [name] Other Clinician: Massie Kluver Referring Jinelle Butchko: Treating Mercy Malena/Extender: Oletta [name] Weeks in Treatment: 81 Vital Signs Time Taken: 11:02 Temperature (F): 98.2 Height (in): 65 Pulse (bpm): 76 Weight (lbs): 245.4 Respiratory Rate (breaths/min): 16 Body Mass Index (BMI): 40.8 Blood Pressure (mmHg): 114/64 Reference Range: 80 - 120 mg / dl Electronic Signature(s) Signed: 08/15/2022 4:23:47 PM By: Massie Kluver Entered By: Massie Kluver on 08/15/2022 11:04:08

## 2022-08-17 DIAGNOSIS — E559 Vitamin D deficiency, unspecified: Secondary | ICD-10-CM | POA: Diagnosis not present

## 2022-08-17 DIAGNOSIS — I739 Peripheral vascular disease, unspecified: Secondary | ICD-10-CM | POA: Diagnosis not present

## 2022-08-17 DIAGNOSIS — E119 Type 2 diabetes mellitus without complications: Secondary | ICD-10-CM | POA: Diagnosis not present

## 2022-08-17 DIAGNOSIS — I10 Essential (primary) hypertension: Secondary | ICD-10-CM | POA: Diagnosis not present

## 2022-08-17 DIAGNOSIS — G8929 Other chronic pain: Secondary | ICD-10-CM | POA: Diagnosis not present

## 2022-08-17 DIAGNOSIS — I509 Heart failure, unspecified: Secondary | ICD-10-CM | POA: Diagnosis not present

## 2022-08-17 DIAGNOSIS — E039 Hypothyroidism, unspecified: Secondary | ICD-10-CM | POA: Diagnosis not present

## 2022-08-19 DIAGNOSIS — M1712 Unilateral primary osteoarthritis, left knee: Secondary | ICD-10-CM | POA: Diagnosis not present

## 2022-08-19 DIAGNOSIS — W19XXXA Unspecified fall, initial encounter: Secondary | ICD-10-CM | POA: Diagnosis not present

## 2022-08-19 DIAGNOSIS — Z743 Need for continuous supervision: Secondary | ICD-10-CM | POA: Diagnosis not present

## 2022-08-19 DIAGNOSIS — I739 Peripheral vascular disease, unspecified: Secondary | ICD-10-CM | POA: Diagnosis not present

## 2022-08-19 DIAGNOSIS — Z7401 Bed confinement status: Secondary | ICD-10-CM | POA: Diagnosis not present

## 2022-08-19 DIAGNOSIS — R29898 Other symptoms and signs involving the musculoskeletal system: Secondary | ICD-10-CM | POA: Diagnosis not present

## 2022-08-22 DIAGNOSIS — E119 Type 2 diabetes mellitus without complications: Secondary | ICD-10-CM | POA: Diagnosis not present

## 2022-08-23 DIAGNOSIS — E119 Type 2 diabetes mellitus without complications: Secondary | ICD-10-CM | POA: Diagnosis not present

## 2022-08-25 DIAGNOSIS — E119 Type 2 diabetes mellitus without complications: Secondary | ICD-10-CM | POA: Diagnosis not present

## 2022-08-30 DIAGNOSIS — L89613 Pressure ulcer of right heel, stage 3: Secondary | ICD-10-CM | POA: Diagnosis not present

## 2022-08-30 DIAGNOSIS — L89893 Pressure ulcer of other site, stage 3: Secondary | ICD-10-CM | POA: Diagnosis not present

## 2022-09-01 DIAGNOSIS — M6281 Muscle weakness (generalized): Secondary | ICD-10-CM | POA: Diagnosis not present

## 2022-09-02 DIAGNOSIS — M6281 Muscle weakness (generalized): Secondary | ICD-10-CM | POA: Diagnosis not present

## 2022-09-05 DIAGNOSIS — M6281 Muscle weakness (generalized): Secondary | ICD-10-CM | POA: Diagnosis not present

## 2022-09-06 DIAGNOSIS — M6281 Muscle weakness (generalized): Secondary | ICD-10-CM | POA: Diagnosis not present

## 2022-09-06 DIAGNOSIS — Z89411 Acquired absence of right great toe: Secondary | ICD-10-CM | POA: Diagnosis not present

## 2022-09-06 DIAGNOSIS — E1140 Type 2 diabetes mellitus with diabetic neuropathy, unspecified: Secondary | ICD-10-CM | POA: Diagnosis not present

## 2022-09-06 DIAGNOSIS — L603 Nail dystrophy: Secondary | ICD-10-CM | POA: Diagnosis not present

## 2022-09-07 DIAGNOSIS — F330 Major depressive disorder, recurrent, mild: Secondary | ICD-10-CM | POA: Diagnosis not present

## 2022-09-07 DIAGNOSIS — R69 Illness, unspecified: Secondary | ICD-10-CM | POA: Diagnosis not present

## 2022-09-07 DIAGNOSIS — M6281 Muscle weakness (generalized): Secondary | ICD-10-CM | POA: Diagnosis not present

## 2022-09-08 DIAGNOSIS — M6281 Muscle weakness (generalized): Secondary | ICD-10-CM | POA: Diagnosis not present

## 2022-09-09 DIAGNOSIS — M6281 Muscle weakness (generalized): Secondary | ICD-10-CM | POA: Diagnosis not present

## 2022-09-12 ENCOUNTER — Encounter: Payer: Medicare HMO | Attending: Physician Assistant | Admitting: Physician Assistant

## 2022-09-12 ENCOUNTER — Other Ambulatory Visit
Admission: RE | Admit: 2022-09-12 | Discharge: 2022-09-12 | Disposition: A | Discharge status: Home / Self Care | Payer: Medicare HMO | Source: Ambulatory Visit | Attending: Physician Assistant | Admitting: Physician Assistant

## 2022-09-12 DIAGNOSIS — R29898 Other symptoms and signs involving the musculoskeletal system: Secondary | ICD-10-CM | POA: Diagnosis not present

## 2022-09-12 DIAGNOSIS — E1140 Type 2 diabetes mellitus with diabetic neuropathy, unspecified: Secondary | ICD-10-CM | POA: Diagnosis not present

## 2022-09-12 DIAGNOSIS — I120 Hypertensive chronic kidney disease with stage 5 chronic kidney disease or end stage renal disease: Secondary | ICD-10-CM | POA: Insufficient documentation

## 2022-09-12 DIAGNOSIS — B999 Unspecified infectious disease: Secondary | ICD-10-CM | POA: Insufficient documentation

## 2022-09-12 DIAGNOSIS — E1122 Type 2 diabetes mellitus with diabetic chronic kidney disease: Secondary | ICD-10-CM | POA: Insufficient documentation

## 2022-09-12 DIAGNOSIS — Z7901 Long term (current) use of anticoagulants: Secondary | ICD-10-CM | POA: Diagnosis not present

## 2022-09-12 DIAGNOSIS — L89893 Pressure ulcer of other site, stage 3: Secondary | ICD-10-CM | POA: Diagnosis not present

## 2022-09-12 DIAGNOSIS — L89896 Pressure-induced deep tissue damage of other site: Secondary | ICD-10-CM | POA: Insufficient documentation

## 2022-09-12 DIAGNOSIS — Z7902 Long term (current) use of antithrombotics/antiplatelets: Secondary | ICD-10-CM | POA: Insufficient documentation

## 2022-09-12 DIAGNOSIS — M6281 Muscle weakness (generalized): Secondary | ICD-10-CM | POA: Diagnosis not present

## 2022-09-12 DIAGNOSIS — L97222 Non-pressure chronic ulcer of left calf with fat layer exposed: Secondary | ICD-10-CM | POA: Diagnosis not present

## 2022-09-12 DIAGNOSIS — Z743 Need for continuous supervision: Secondary | ICD-10-CM | POA: Diagnosis not present

## 2022-09-12 DIAGNOSIS — L89153 Pressure ulcer of sacral region, stage 3: Secondary | ICD-10-CM | POA: Insufficient documentation

## 2022-09-12 DIAGNOSIS — R531 Weakness: Secondary | ICD-10-CM | POA: Diagnosis not present

## 2022-09-12 DIAGNOSIS — Z6830 Body mass index (BMI) 30.0-30.9, adult: Secondary | ICD-10-CM | POA: Insufficient documentation

## 2022-09-12 DIAGNOSIS — N186 End stage renal disease: Secondary | ICD-10-CM | POA: Diagnosis not present

## 2022-09-12 DIAGNOSIS — I890 Lymphedema, not elsewhere classified: Secondary | ICD-10-CM | POA: Diagnosis not present

## 2022-09-12 DIAGNOSIS — E668 Other obesity: Secondary | ICD-10-CM | POA: Insufficient documentation

## 2022-09-12 DIAGNOSIS — E038 Other specified hypothyroidism: Secondary | ICD-10-CM | POA: Diagnosis not present

## 2022-09-12 DIAGNOSIS — Z7401 Bed confinement status: Secondary | ICD-10-CM | POA: Diagnosis not present

## 2022-09-12 DIAGNOSIS — L97822 Non-pressure chronic ulcer of other part of left lower leg with fat layer exposed: Secondary | ICD-10-CM | POA: Diagnosis not present

## 2022-09-12 DIAGNOSIS — E11622 Type 2 diabetes mellitus with other skin ulcer: Secondary | ICD-10-CM | POA: Insufficient documentation

## 2022-09-12 DIAGNOSIS — W19XXXA Unspecified fall, initial encounter: Secondary | ICD-10-CM | POA: Diagnosis not present

## 2022-09-12 NOTE — Progress Notes (Addendum)
[name], [name] ([id number]) [id number] Page 1 of 10 Visit Report for 09/12/2022 Chief Complaint Document Details Patient Name: Date of Service: [name], [name] 09/12/2022 10:45 A M Medical Record Number: [id number] Patient Account Number: [id number] Date of Birth/Sex: Treating RN: [birth year] (67 y.o. 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 New sacral wound as of 10/29/21 Electronic Signature(s) Signed: 09/12/2022 11:13:14 AM By: Autumn Keeler PA-C Entered By: [name] on 09/12/2022 11:13:14 -------------------------------------------------------------------------------- HPI Details Patient Name: Date of Service: [name]. 09/12/2022 10:45 A M Medical Record Number: [id number] Patient Account Number: [id number] Date of Birth/Sex: Treating RN: 1955/11/25 (66 y.o. [name] Primary Care Provider: Otilio [name] Other Clinician: Massie [name] Referring Provider: Treating Provider/Extender: [name] Weeks in Treatment: 44 History of Present Illness HPI Description: 67yo w/ h/o BLE phlebolymphedema, type 2 DM (unknown hemoglobin A1c), and obesity. No PVD. h/o chronic, recurrent BLE calf ulcers. Treated with 4 layer compression. Infrequently uses lymphedema pump. Bilateral lower extremity ulcerations healed as of June 2016. Fitted for custom compression stockings but did not receive them. Patient could not travel for lymphedema consult. She developed recurrent bilateral lower extremity ulcerations in August 2016. She is able to heal these quite quickly with 4 layer elastic compression bandages. However, she is not very compliant using her juxtalite  compression garments and tends to develop recurrent ulcerations while using juxtalite. Unna boots were applied this past week. She tolerated this well but performs minimal ambulation. She is scheduled to undergo total hip replacement tomorrow with [name]. She is unsure if he is aware of her ulcerations. She is otherwise without complaints. No significant pain. No fever or chills. Moderate clear drainage. Not on any antibiotics. 06/04/2015 -- she did have her hip surgery and has now been in rehabilitation and they're controlling her fluid intake and diuretics to the extent that she has lost 13 pounds of weight and her lower extremity circumference is decreased by 7 cm. 06/18/2015 -- she is still in the rehabilitation facility and continues to be looked after well and has lost a total of 15 pounds and her diabetes is under much better control. [name], [name] ([id number]) [id number] Page 2 of 10 Readmission: 06/03/2021 upon evaluation today patient actually appears to be doing somewhat poorly in regard to a wound on the right posterior lower leg. Patient does have a history of previously having been seen here in the clinic but this predates my time here back in 2017. Subsequently unfortunately right now she is having a significant issue with bleeding in the posterior of her right calf this appears to be a deep tissue injury as developed into an ulceration. She does have a history significant for lymphedema, diabetes mellitus type 2, diabetic neuropathy, hypertension, hypothyroidism, and obesity. She is currently in a skilled nursing facility. The dressing that she had in place during evaluation today was Xeroform followed by significant amount of gauze and this was severely stuck. Upon removal the patient was having significant bleeding she also had significant work done in the hospital in regard to her blood flow and [name] does look like he has her on 2 blood  thinners Plavix and Lovenox. This is probably part of the reason why she is bleeding so significantly. With that being said I do believe  that based on what we are seeing currently this is probably can to be something that were getting have to try to get stopped with more than just pressure to the region currently. I think surgery foam is probably can to be necessary. If we can get the stop of the Surgifoam she is probably can have to go to the hospital to try to get this bleeding stopped. 12/30; this is a patient who is currently in a skilled facility. She has wounds on her bilateral calfs likely secondary to severe chronic venous insufficiency and lymphedema the care of these wounds is complicated by the fact that she is on Lovenox and Plavix and bleeds very frequently with even minimal trauma to these wounds. It would appear that she had a Vaseline gauze on the surface of these in the process of undressing them she bled freely from especially the right but also the left. I had to go in and use silver nitrate. The skin on her legs is significant for chronic venous stasis and lymphedema. Very dry flaking skin 07/16/2021; I actually was a patent person who saw this woman 3 weeks ago. She is a nursing facility has chronic venous wounds and bilateral lower extremity wounds. We use Xeroform under compression. The time she was on Lovenox and Plavix I thought I had an excellent reason for these wounds to bleed so freely. Since then the Lovenox has been stopped and should be well out of her system but she is still on Plavix. She still has very friable bleeding wounds on the left lateral left Posterior and right Posterior lower extremity.These believe very freely when we take off the dressing Also she has a left heel wound which when we saw her last time was a surgical wound however I gather we have been asked to follow this as well. I am not exactly sure at this time what her surgery was but she comes in with  a wound VAC on this and they are apparently changing it at the nursing home. 08/27/2021 upon evaluation patient appears to be doing better in regard to her wound on the legs as well as the heel. Fortunately at this point I do not see any need for a wound VAC which is good news overall I think that the Xeroform is probably a good option that is what is being utilized here as well. Fortunately I do not see any signs of active infection locally nor systemically which is great news and overall I think that the patient is making excellent progress. The area on her right gluteal area actually appears to be doing quite well and I am pleased with that. I do believe the silver alginate dressing here could be of benefit for her. 09/17/2021 upon evaluation patient appears to be doing actually better in regard to her wounds on the legs. Fortunately she does not seem to be showing any signs of worsening which is great news and overall very pleased with where we stand. I do believe that she is tolerating the dressing changes without complication which is great news. 10-08-2021 upon evaluation today patient unfortunately continues to have significant issues with her legs. Unfortunately the biggest issue that we see Currently is that she is still having a lot of bleeding from her legs that she is also not able to move well due to her knee pain her left knee is quite significant as far as the discomfort is concerned and the right knee is getting equally as bad. Fortunately  I do not see any evidence of active infection locally or systemically which is great news. Nonetheless I do believe that her heels are looking much better there is a tiny area open on the left heel the right is doing quite well. In regard to the gluteal/sacral region this appears to be healed. 10-29-2021 upon evaluation today patient unfortunately is appearing to do much worse compared to normal. She has more breakdown in regard to the left lateral leg and  again I think this is pressure related. She also has more breakdown in regard to the gluteal area and she is very concerned about this in particular which is completely understandable. With that being said I do believe that she would benefit from more aggressive and appropriate offloading I discussed with her that she needs to allow the facility to change her position every 2 hours and she is can have to find different positions other than just lying on her back. Also discussed with her that obviously the facility needs to make sure to help her with repositioning every 2 hours I think both aspects of this are important. Otherwise her wound on the sacral area is going to continue to get worse and the leg will continue to get worse. 11-26-2021 upon evaluation today patient appears to be doing well with regard to her wounds all things considered. The gluteal areas are all healed which is great news. With that being said she is still having some issues here with her lower extremities. I do think that this is still something that we need to be very mindful of. Unfortunately the facility is still not washing her legs she has a lot of caked on dry skin with Xeroform we will get a try to get some of that off today. With that being said he does have reiterated in the past the patient needs to have her legs washed regularly. 12-24-2021 upon evaluation patient's wounds are showing signs of improvement albeit slowly. Fortunately I do not see any signs of infection locally or systemically which is great news. Nonetheless I do believe that the patient is doing quite well from the standpoint of the overall appearance of her wounds which are better than where they have been even if not where we would like them to be. 02-03-2022 upon evaluation today patient appears to be doing about the same in regard to her wounds. There is maybe a little bit of improvement in general based on what I am seeing currently but not much to be  honest. I do think that the patient is making some progress here when it comes to the overall appearance of her wounds. 04-25-2022 upon evaluation patient actually is here for the first time in about 2-1/2 months. She has had multiple reasons why she did not make it part of the time she was sick and some of the times it was more of a transportation thing. Nonetheless I am glad that she is finally here good news is some of the wounds do appear to be a little bit drier and she has been using a alginate dressing at the facility which they switched her to honestly I cannot argue with the fact that it seems to be doing somewhat better in my opinion. With that being said I do believe one of the biggest issues that I see is simply that she is still having some issues here with pressure although I do not see as much of the dark discoloration as we previously noted she  has been using the Prevalon offloading boots which is excellent. I want her to keep using that as I think that still good to be ideal and optimal for her. She does inquire about being able to put pressure on her heels at this point. 06-02-2022 upon evaluation today patient appears to be unfortunately still having a lot of issues with her legs. Were not seeing a whole lot of improvement she is not as bad as she has been at some point in time but she is also not as good as we would like to see. 07-18-2022 upon evaluation today patient appears to be doing well currently in regard to her wounds on the legs all things considered. This is actually showing signs of improvement compared to where we have been. Overall I am extremely pleased in that regard. She does have a small area open on her sacral region but this is minimal at this point and actually just more of an irritation at the just the topical barrier cream is ideal here. 08-15-2022 upon evaluation today patient appears to be doing well currently in regard to her wounds all things considered. This  is actually one of the better times that I have seen her as far as the overall appearance is concerned I think her heel is doing better she really does not allow me to do much with that as it hurts so badly. Her legs also doing quite well although extremely hyper granulated with the Xeroform gauze. I think it would be worthwhile trying the Sorbact to see if this could be beneficial she does not remember doing this in the past. I think it could prevent it from sticking and then subsequently could also help pull away some of the drainage which will be helpful as well. 09-12-2022 upon evaluation today patient appears to be doing well currently in regard to her legs the left is better than the right on the right I am concerned a little bit about the possibility of infection I discussed that with her today. With that being said I think that we may want to obtain a wound culture and we are going to go ahead and grab that today. Electronic Signature(s) CEANA, MCMAHAN ([id number]) [id number] Page 3 of 10 Signed: 09/12/2022 1:06:39 PM By: Autumn Keeler PA-C Entered By: [name] on 09/12/2022 13:06:39 -------------------------------------------------------------------------------- Physical Exam Details Patient Name: Date of Service: [name], [name] 09/12/2022 10:45 A M Medical Record Number: [id number] Patient Account Number: [id number] Date of Birth/Sex: Treating RN: 1955-07-27 (67 y.o. [name] Primary Care Provider: Otilio [name] Other Clinician: Massie [name] Referring Provider: Treating Provider/Extender: [name] Weeks in Treatment: 80 Constitutional Obese and well-hydrated in no acute distress. Respiratory normal breathing without difficulty. Psychiatric this patient is able to make decisions and demonstrates good insight into disease process. Alert and Oriented x 3. pleasant and cooperative. Notes Upon inspection patient's  wound bed actually showed signs of good granulation epithelization for the most part. Her legs actually are doing quite well the heel is completely healed and overall I am pleased with what we are seeing except for the posterior aspect of the right leg which is where this really looks the worst at this point. Electronic Signature(s) Signed: 09/12/2022 1:07:09 PM By: Autumn Keeler PA-C Entered By: [name] on 09/12/2022 13:07:09 -------------------------------------------------------------------------------- Physician Orders Details Patient Name: Date of Service: [name]. 09/12/2022 10:45 A M Medical Record Number: [id number] Patient Account Number: [id number] Date  of Birth/Sex: Treating RN: [birth year] (67 y.o. [name] Primary Care Provider: Otilio [name] Other Clinician: Massie [name] Referring Provider: Treating Provider/Extender: [name] Weeks in Treatment: 68 Verbal / Phone Orders: Yes Clinician: Cornell Barman Read Back and Verified: Yes Diagnosis Coding ICD-10 Coding Code Description I89.0 Lymphedema, not elsewhere classified E11.622 Type 2 diabetes mellitus with other skin ulcer Craddock, Manuela [name] ([id number]) [id number] Page 4 of 10 E11.40 Type 2 diabetes mellitus with diabetic neuropathy, unspecified L89.893 Pressure ulcer of other site, stage 3 L89.896 Pressure-induced deep tissue damage of other site L89.153 Pressure ulcer of sacral region, stage 3 I10 Essential (primary) hypertension E03.8 Other specified hypothyroidism E66.09 Other obesity due to excess calories Follow-up Appointments Wound #60 Right,Circumferential Lower Leg Return Appointment in 1 month Bathing/ Shower/ Hygiene Wash wounds with antibacterial soap and water. - WASH LEGS WITH SOAP and WATER before applying dressings. No tub bath. Edema Control - Lymphedema / Segmental Compressive Device / Other Other: - Lymphedema-use ace wraps to  help control swelling. Off-Loading Turn and reposition every 2 hours - PLEASE MAKE SURE THIS IS COMPLETED EVERY TWO HOURS THANK YOU Other: - Put pillows under knees to keep pressure off of calves. Deep tissue injury on posterior calves. Turn on side every 1 hous to keep pressure off calves wear offloading boots Additional Orders / Instructions Follow Nutritious Diet and Increase Protein Intake Wound Treatment Wound #60 - Lower Leg Wound Laterality: Right, Circumferential Cleanser: Normal Saline 3 x Per Week/30 Days Discharge Instructions: Wash your hands with soap and water. Remove old dressing, discard into plastic bag and place into trash. Cleanse the wound with Normal Saline prior to applying a clean dressing using gauze sponges, not tissues or cotton balls. Do not scrub or use excessive force. Pat dry using gauze sponges, not tissue or cotton balls. Peri-Wound Care: AandD Ointment 3 x Per Week/30 Days Discharge Instructions: Apply AandD Ointment to entire lower legs including wounds Prim Dressing: Cutimed Sorbact 1.5x 2.38 (in/in) 3 x Per Week/30 Days ary Discharge Instructions: A bacteria- and fungi binding wound dressing, suitable for cavities and fistulas. It is suitable as a wound filler and allows the passage of wound exudate into a secondary dressing. The dressing helps reducing odor and pain and can improve healing. Secondary Dressing: ABD Pad 5x9 (in/in) 3 x Per Week/30 Days Discharge Instructions: 2. Cover with ABD pad Secured With: ACE WRAP - 45M ACE Elastic Bandage With VELCRO Brand Closure, 4 (in) 3 x Per Week/30 Days Discharge Instructions: 4. Ace wrap to secure dressing in place and control swelling. Secured With: [employer] or Non-Sterile 6-ply 4.5x4 (yd/yd) 3 x Per Week/30 Days Discharge Instructions: 3. Apply Kerlix as directed Wound #61 - Lower Leg Wound Laterality: Left, Posterior Cleanser: Normal Saline 3 x Per Week/30 Days Discharge Instructions: Wash your  hands with soap and water. Remove old dressing, discard into plastic bag and place into trash. Cleanse the wound with Normal Saline prior to applying a clean dressing using gauze sponges, not tissues or cotton balls. Do not scrub or use excessive force. Pat dry using gauze sponges, not tissue or cotton balls. Peri-Wound Care: AandD Ointment 3 x Per Week/30 Days Discharge Instructions: Apply AandD Ointment to entire lower legs including wounds Prim Dressing: Cutimed Sorbact 1.5x 2.38 (in/in) 3 x Per Week/30 Days ary Discharge Instructions: A bacteria- and fungi binding wound dressing, suitable for cavities and fistulas. It is suitable as a wound filler and allows the passage of wound exudate into  a secondary dressing. The dressing helps reducing odor and pain and can improve healing. Secondary Dressing: ABD Pad 5x9 (in/in) 3 x Per Week/30 Days Discharge Instructions: 2. Cover with ABD pad Secured With: ACE WRAP - 38M ACE Elastic Bandage With VELCRO Brand Closure, 4 (in) 3 x Per Week/30 Days Discharge Instructions: 4. Ace wrap to secure dressing in place and control swelling. Secured With: [employer] or Non-Sterile 6-ply 4.5x4 (yd/yd) 3 x Per Week/30 Days Discharge Instructions: 3. Apply Kerlix as directed Wound #62 - Lower Leg Wound Laterality: Left, Lateral Cleanser: Normal Saline 3 x Per Week/30 Days [name], [name] ([id number]) [id number] Page 5 of 10 Discharge Instructions: Wash your hands with soap and water. Remove old dressing, discard into plastic bag and place into trash. Cleanse the wound with Normal Saline prior to applying a clean dressing using gauze sponges, not tissues or cotton balls. Do not scrub or use excessive force. Pat dry using gauze sponges, not tissue or cotton balls. Peri-Wound Care: AandD Ointment 3 x Per Week/30 Days Discharge Instructions: Apply AandD Ointment to entire lower legs including wounds Prim Dressing: Cutimed Sorbact  1.5x 2.38 (in/in) 3 x Per Week/30 Days ary Discharge Instructions: A bacteria- and fungi binding wound dressing, suitable for cavities and fistulas. It is suitable as a wound filler and allows the passage of wound exudate into a secondary dressing. The dressing helps reducing odor and pain and can improve healing. Secondary Dressing: ABD Pad 5x9 (in/in) 3 x Per Week/30 Days Discharge Instructions: 2. Cover with ABD pad Secured With: ACE WRAP - 38M ACE Elastic Bandage With VELCRO Brand Closure, 4 (in) 3 x Per Week/30 Days Discharge Instructions: 4. Ace wrap to secure dressing in place and control swelling. Secured With: [employer] or Non-Sterile 6-ply 4.5x4 (yd/yd) 3 x Per Week/30 Days Discharge Instructions: 3. Apply Kerlix as directed Laboratory Bacteria identified in Wound by Culture (MICRO) - culture right lower leg LOINC Code: [id number] Convenience Name: Wound culture routine Electronic Signature(s) Signed: 09/14/2022 5:18:06 PM By: [name] Signed: 09/15/2022 5:06:45 PM By: Autumn Keeler PA-C Previous Signature: 09/12/2022 5:58:33 PM Version By: Autumn Keeler PA-C Previous Signature: 09/13/2022 9:39:47 AM Version By: [name] Entered By: [name] on 09/14/2022 16:19:46 -------------------------------------------------------------------------------- Problem List Details Patient Name: Date of Service: [name]. 09/12/2022 10:45 A M Medical Record Number: [id number] Patient Account Number: [id number] Date of Birth/Sex: Treating RN: [birth year] (67 y.o. [name] Primary Care Provider: Otilio [name] Other Clinician: Massie [name] Referring Provider: Treating Provider/Extender: [name] Weeks in Treatment: 66 Active Problems ICD-10 Encounter Code Description Active Date MDM Diagnosis I89.0 Lymphedema, not elsewhere classified 06/03/2021 No Yes E11.622 Type 2 diabetes mellitus with other skin ulcer 06/03/2021 No Yes E11.40 Type 2  diabetes mellitus with diabetic neuropathy, unspecified 06/03/2021 No Yes L89.893 Pressure ulcer of other site, stage 3 06/03/2021 No Yes [name], [name] ([id number]) [id number] Page 6 of 10 L89.896 Pressure-induced deep tissue damage of other site 06/03/2021 No Yes L89.153 Pressure ulcer of sacral region, stage 3 08/27/2021 No Yes I10 Essential (primary) hypertension 06/03/2021 No Yes E03.8 Other specified hypothyroidism 06/03/2021 No Yes E66.09 Other obesity due to excess calories 06/03/2021 No Yes Inactive Problems Resolved Problems Electronic Signature(s) Signed: 09/12/2022 11:13:05 AM By: Autumn Keeler PA-C Entered By: [name] on 09/12/2022 11:13:05 -------------------------------------------------------------------------------- Progress Note Details Patient Name: Date of Service: [name]. 09/12/2022 10:45 A M Medical Record Number: [id number] Patient Account Number: [id number]  Date of Birth/Sex: Treating RN: [birth year] (67 y.o. 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 New sacral wound as of 10/29/21 History of Present Illness (HPI) 67yo w/ h/o BLE phlebolymphedema, type 2 DM (unknown hemoglobin A1c), and obesity. No PVD. h/o chronic, recurrent BLE calf ulcers. Treated with 4 layer compression. Infrequently uses lymphedema pump. Bilateral lower extremity ulcerations healed as of June 2016. Fitted for custom compression stockings but did not receive them. Patient could not travel for lymphedema consult. She developed recurrent bilateral lower extremity ulcerations in August 2016. She is able to heal these quite quickly with 4 layer elastic compression  bandages. However, she is not very compliant using her juxtalite compression garments and tends to develop recurrent ulcerations while using juxtalite. Unna boots were applied this past week. She tolerated this well but performs minimal ambulation. She is scheduled to undergo total hip replacement tomorrow with [name]. She is unsure if he is aware of her ulcerations. She is otherwise without complaints. No significant pain. No fever or chills. Moderate clear drainage. Not on any antibiotics. 06/04/2015 -- she did have her hip surgery and has now been in rehabilitation and they're controlling her fluid intake and diuretics to the extent that she has lost 13 pounds of weight and her lower extremity circumference is decreased by 7 cm. 06/18/2015 -- she is still in the rehabilitation facility and continues to be looked after well and has lost a total of 15 pounds and her diabetes is under much [name], [name] ([id number]) [id number] Page 7 of 10 better control. Readmission: 06/03/2021 upon evaluation today patient actually appears to be doing somewhat poorly in regard to a wound on the right posterior lower leg. Patient does have a history of previously having been seen here in the clinic but this predates my time here back in 2017. Subsequently unfortunately right now she is having a significant issue with bleeding in the posterior of her right calf this appears to be a deep tissue injury as developed into an ulceration. She does have a history significant for lymphedema, diabetes mellitus type 2, diabetic neuropathy, hypertension, hypothyroidism, and obesity. She is currently in a skilled nursing facility. The dressing that she had in place during evaluation today was Xeroform followed by significant amount of gauze and this was severely stuck. Upon removal the patient was having significant bleeding she also had significant work done in the hospital in regard to her  blood flow and [name] does look like he has her on 2 blood thinners Plavix and Lovenox. This is probably part of the reason why she is bleeding so significantly. With that being said I do believe that based on what we are seeing currently this is probably can to be something that were getting have to try to get stopped with more than just pressure to the region currently. I think surgery foam is probably can to be necessary. If we can get the stop of the Surgifoam she is probably can have to go to the hospital to try to get this bleeding stopped. 12/30; this is a patient who is currently in a skilled facility. She has wounds on her bilateral calfs likely secondary to severe chronic venous insufficiency and lymphedema the care of these wounds is complicated by the fact that she is on Lovenox and  Plavix and bleeds very frequently with even minimal trauma to these wounds. It would appear that she had a Vaseline gauze on the surface of these in the process of undressing them she bled freely from especially the right but also the left. I had to go in and use silver nitrate. The skin on her legs is significant for chronic venous stasis and lymphedema. Very dry flaking skin 07/16/2021; I actually was a patent person who saw this woman 3 weeks ago. She is a nursing facility has chronic venous wounds and bilateral lower extremity wounds. We use Xeroform under compression. The time she was on Lovenox and Plavix I thought I had an excellent reason for these wounds to bleed so freely. Since then the Lovenox has been stopped and should be well out of her system but she is still on Plavix. She still has very friable bleeding wounds on the left lateral left Posterior and right Posterior lower extremity.These believe very freely when we take off the dressing Also she has a left heel wound which when we saw her last time was a surgical wound however I gather we have been asked to follow this as well. I am not exactly  sure at this time what her surgery was but she comes in with a wound VAC on this and they are apparently changing it at the nursing home. 08/27/2021 upon evaluation patient appears to be doing better in regard to her wound on the legs as well as the heel. Fortunately at this point I do not see any need for a wound VAC which is good news overall I think that the Xeroform is probably a good option that is what is being utilized here as well. Fortunately I do not see any signs of active infection locally nor systemically which is great news and overall I think that the patient is making excellent progress. The area on her right gluteal area actually appears to be doing quite well and I am pleased with that. I do believe the silver alginate dressing here could be of benefit for her. 09/17/2021 upon evaluation patient appears to be doing actually better in regard to her wounds on the legs. Fortunately she does not seem to be showing any signs of worsening which is great news and overall very pleased with where we stand. I do believe that she is tolerating the dressing changes without complication which is great news. 10-08-2021 upon evaluation today patient unfortunately continues to have significant issues with her legs. Unfortunately the biggest issue that we see Currently is that she is still having a lot of bleeding from her legs that she is also not able to move well due to her knee pain her left knee is quite significant as far as the discomfort is concerned and the right knee is getting equally as bad. Fortunately I do not see any evidence of active infection locally or systemically which is great news. Nonetheless I do believe that her heels are looking much better there is a tiny area open on the left heel the right is doing quite well. In regard to the gluteal/sacral region this appears to be healed. 10-29-2021 upon evaluation today patient unfortunately is appearing to do much worse compared to normal.  She has more breakdown in regard to the left lateral leg and again I think this is pressure related. She also has more breakdown in regard to the gluteal area and she is very concerned about this in particular which is completely understandable.  With that being said I do believe that she would benefit from more aggressive and appropriate offloading I discussed with her that she needs to allow the facility to change her position every 2 hours and she is can have to find different positions other than just lying on her back. Also discussed with her that obviously the facility needs to make sure to help her with repositioning every 2 hours I think both aspects of this are important. Otherwise her wound on the sacral area is going to continue to get worse and the leg will continue to get worse. 11-26-2021 upon evaluation today patient appears to be doing well with regard to her wounds all things considered. The gluteal areas are all healed which is great news. With that being said she is still having some issues here with her lower extremities. I do think that this is still something that we need to be very mindful of. Unfortunately the facility is still not washing her legs she has a lot of caked on dry skin with Xeroform we will get a try to get some of that off today. With that being said he does have reiterated in the past the patient needs to have her legs washed regularly. 12-24-2021 upon evaluation patient's wounds are showing signs of improvement albeit slowly. Fortunately I do not see any signs of infection locally or systemically which is great news. Nonetheless I do believe that the patient is doing quite well from the standpoint of the overall appearance of her wounds which are better than where they have been even if not where we would like them to be. 02-03-2022 upon evaluation today patient appears to be doing about the same in regard to her wounds. There is maybe a little bit of improvement in  general based on what I am seeing currently but not much to be honest. I do think that the patient is making some progress here when it comes to the overall appearance of her wounds. 04-25-2022 upon evaluation patient actually is here for the first time in about 2-1/2 months. She has had multiple reasons why she did not make it part of the time she was sick and some of the times it was more of a transportation thing. Nonetheless I am glad that she is finally here good news is some of the wounds do appear to be a little bit drier and she has been using a alginate dressing at the facility which they switched her to honestly I cannot argue with the fact that it seems to be doing somewhat better in my opinion. With that being said I do believe one of the biggest issues that I see is simply that she is still having some issues here with pressure although I do not see as much of the dark discoloration as we previously noted she has been using the Prevalon offloading boots which is excellent. I want her to keep using that as I think that still good to be ideal and optimal for her. She does inquire about being able to put pressure on her heels at this point. 06-02-2022 upon evaluation today patient appears to be unfortunately still having a lot of issues with her legs. Were not seeing a whole lot of improvement she is not as bad as she has been at some point in time but she is also not as good as we would like to see. 07-18-2022 upon evaluation today patient appears to be doing well currently in regard to her  wounds on the legs all things considered. This is actually showing signs of improvement compared to where we have been. Overall I am extremely pleased in that regard. She does have a small area open on her sacral region but this is minimal at this point and actually just more of an irritation at the just the topical barrier cream is ideal here. 08-15-2022 upon evaluation today patient appears to be doing  well currently in regard to her wounds all things considered. This is actually one of the better times that I have seen her as far as the overall appearance is concerned I think her heel is doing better she really does not allow me to do much with that as it hurts so badly. Her legs also doing quite well although extremely hyper granulated with the Xeroform gauze. I think it would be worthwhile trying the Sorbact to see if this could be beneficial she does not remember doing this in the past. I think it could prevent it from sticking and then subsequently could also help pull away some of the drainage which will be helpful as well. 09-12-2022 upon evaluation today patient appears to be doing well currently in regard to her legs the left is better than the right on the right I am concerned a little bit about the possibility of infection I discussed that with her today. With that being said I think that we may want to obtain a wound culture and we are going to go ahead and grab that today. [name], [name] ([id number]) [id number] Page 8 of 10 Objective Constitutional Obese and well-hydrated in no acute distress. Vitals Time Taken: 11:13 AM, Height: 75 in, Weight: 245.4 lbs, BMI: 30.7, Temperature: 97.6 F, Pulse: 75 bpm, Respiratory Rate: 18 breaths/min, Blood Pressure: 158/79 mmHg. Respiratory normal breathing without difficulty. Psychiatric this patient is able to make decisions and demonstrates good insight into disease process. Alert and Oriented x 3. pleasant and cooperative. General Notes: Upon inspection patient's wound bed actually showed signs of good granulation epithelization for the most part. Her legs actually are doing quite well the heel is completely healed and overall I am pleased with what we are seeing except for the posterior aspect of the right leg which is where this really looks the worst at this point. Integumentary (Hair, Skin) Wound #60  status is Open. Original cause of wound was Pressure Injury. The date acquired was: 05/17/2021. The wound has been in treatment 66 weeks. The wound is located on the Right,Circumferential Lower Leg. The wound measures 18.5cm length x 16cm width x 0.1cm depth; 232.478cm^2 area and 23.248cm^3 volume. There is Fat Layer (Subcutaneous Tissue) exposed. There is no tunneling or undermining noted. There is a large amount of sanguinous drainage noted. There is medium (34-66%) red, pink granulation within the wound bed. There is a small (1-33%) amount of necrotic tissue within the wound bed including Adherent Slough. Wound #61 status is Open. Original cause of wound was Gradually Appeared. The date acquired was: 06/11/2021. The wound has been in treatment 63 weeks. The wound is located on the Left,Posterior Lower Leg. The wound measures 7.5cm length x 8.5cm width x 0.1cm depth; 50.069cm^2 area and 5.007cm^3 volume. There is Fat Layer (Subcutaneous Tissue) exposed. There is no tunneling or undermining noted. There is a large amount of serosanguineous drainage noted. There is medium (34-66%) red granulation within the wound bed. There is a small (1-33%) amount of necrotic tissue within the wound bed including Adherent Slough. Wound #62 status  is Open. Original cause of wound was Gradually Appeared. The date acquired was: 06/11/2021. The wound has been in treatment 63 weeks. The wound is located on the Left,Lateral Lower Leg. The wound measures 18.5cm length x 8.5cm width x 0.1cm depth; 123.504cm^2 area and 12.35cm^3 volume. There is Fat Layer (Subcutaneous Tissue) exposed. There is no tunneling or undermining noted. There is a large amount of sanguinous drainage noted. There is large (67-100%) red, pale granulation within the wound bed. There is a small (1-33%) amount of necrotic tissue within the wound bed including Adherent Slough. Wound #67 status is Healed - Epithelialized. Original cause of wound was  Pressure Injury. The date acquired was: 01/13/2022. The wound has been in treatment 31 weeks. The wound is located on the Right Calcaneus. The wound measures 0cm length x 0cm width x 0cm depth; 0cm^2 area and 0cm^3 volume. There is no tunneling or undermining noted. There is a none present amount of drainage noted. There is no granulation within the wound bed. There is no necrotic tissue within the wound bed. Assessment Active Problems ICD-10 Lymphedema, not elsewhere classified Type 2 diabetes mellitus with other skin ulcer Type 2 diabetes mellitus with diabetic neuropathy, unspecified Pressure ulcer of other site, stage 3 Pressure-induced deep tissue damage of other site Pressure ulcer of sacral region, stage 3 Essential (primary) hypertension Other specified hypothyroidism Other obesity due to excess calories Plan Follow-up Appointments: Wound #60 Right,Circumferential Lower Leg: Return Appointment in 1 month Bathing/ Shower/ Hygiene: Wash wounds with antibacterial soap and water. - WASH LEGS WITH SOAP and WATER before applying dressings. No tub bath. Edema Control - Lymphedema / Segmental Compressive Device / Other: Other: - Lymphedema-use ace wraps to help control swelling. Off-Loading: Turn and reposition every 2 hours - PLEASE MAKE SURE THIS IS COMPLETED EVERY TWO HOURS THANK YOU Other: - Put pillows under knees to keep pressure off of calves. Deep tissue injury on posterior calves. Turn on side every 1 hous to keep pressure off calves [name], [name] ([id number]) [id number] Page 9 of 10 wear offloading boots Additional Orders / Instructions: Follow Nutritious Diet and Increase Protein Intake Laboratory ordered were: Wound culture routine - culture right lower leg WOUND #60: - Lower Leg Wound Laterality: Right, Circumferential Cleanser: Normal Saline 3 x Per Week/30 Days Discharge Instructions: Wash your hands with soap and water. Remove old  dressing, discard into plastic bag and place into trash. Cleanse the wound with Normal Saline prior to applying a clean dressing using gauze sponges, not tissues or cotton balls. Do not scrub or use excessive force. Pat dry using gauze sponges, not tissue or cotton balls. Peri-Wound Care: AandD Ointment 3 x Per Week/30 Days Discharge Instructions: Apply AandD Ointment to entire lower legs including wounds Prim Dressing: Cutimed Sorbact 1.5x 2.38 (in/in) 3 x Per Week/30 Days ary Discharge Instructions: A bacteria- and fungi binding wound dressing, suitable for cavities and fistulas. It is suitable as a wound filler and allows the passage of wound exudate into a secondary dressing. The dressing helps reducing odor and pain and can improve healing. Secondary Dressing: ABD Pad 5x9 (in/in) 3 x Per Week/30 Days Discharge Instructions: 2. Cover with ABD pad Secured With: ACE WRAP - 51M ACE Elastic Bandage With VELCRO Brand Closure, 4 (in) 3 x Per Week/30 Days Discharge Instructions: 4. Ace wrap to secure dressing in place and control swelling. Secured With: [employer] or Non-Sterile 6-ply 4.5x4 (yd/yd) 3 x Per Week/30 Days Discharge Instructions: 3. Apply Kerlix as directed  WOUND #61: - Lower Leg Wound Laterality: Left, Posterior Cleanser: Normal Saline 3 x Per Week/30 Days Discharge Instructions: Wash your hands with soap and water. Remove old dressing, discard into plastic bag and place into trash. Cleanse the wound with Normal Saline prior to applying a clean dressing using gauze sponges, not tissues or cotton balls. Do not scrub or use excessive force. Pat dry using gauze sponges, not tissue or cotton balls. Peri-Wound Care: AandD Ointment 3 x Per Week/30 Days Discharge Instructions: Apply AandD Ointment to entire lower legs including wounds Prim Dressing: Cutimed Sorbact 1.5x 2.38 (in/in) 3 x Per Week/30 Days ary Discharge Instructions: A bacteria- and fungi binding wound dressing,  suitable for cavities and fistulas. It is suitable as a wound filler and allows the passage of wound exudate into a secondary dressing. The dressing helps reducing odor and pain and can improve healing. Secondary Dressing: ABD Pad 5x9 (in/in) 3 x Per Week/30 Days Discharge Instructions: 2. Cover with ABD pad Secured With: ACE WRAP - 48M ACE Elastic Bandage With VELCRO Brand Closure, 4 (in) 3 x Per Week/30 Days Discharge Instructions: 4. Ace wrap to secure dressing in place and control swelling. Secured With: [employer] or Non-Sterile 6-ply 4.5x4 (yd/yd) 3 x Per Week/30 Days Discharge Instructions: 3. Apply Kerlix as directed WOUND #62: - Lower Leg Wound Laterality: Left, Lateral Cleanser: Normal Saline 3 x Per Week/30 Days Discharge Instructions: Wash your hands with soap and water. Remove old dressing, discard into plastic bag and place into trash. Cleanse the wound with Normal Saline prior to applying a clean dressing using gauze sponges, not tissues or cotton balls. Do not scrub or use excessive force. Pat dry using gauze sponges, not tissue or cotton balls. Peri-Wound Care: AandD Ointment 3 x Per Week/30 Days Discharge Instructions: Apply AandD Ointment to entire lower legs including wounds Prim Dressing: Cutimed Sorbact 1.5x 2.38 (in/in) 3 x Per Week/30 Days ary Discharge Instructions: A bacteria- and fungi binding wound dressing, suitable for cavities and fistulas. It is suitable as a wound filler and allows the passage of wound exudate into a secondary dressing. The dressing helps reducing odor and pain and can improve healing. Secondary Dressing: ABD Pad 5x9 (in/in) 3 x Per Week/30 Days Discharge Instructions: 2. Cover with ABD pad Secured With: ACE WRAP - 48M ACE Elastic Bandage With VELCRO Brand Closure, 4 (in) 3 x Per Week/30 Days Discharge Instructions: 4. Ace wrap to secure dressing in place and control swelling. Secured With: [employer] or Non-Sterile 6-ply 4.5x4  (yd/yd) 3 x Per Week/30 Days Discharge Instructions: 3. Apply Kerlix as directed 1. Based on what I am seeing I do believe that the patient would benefit from a continuation of therapy with the Xeroform gauze dressing we have tried other options but unfortunately this just does not work very well for her at all to be honest. I do believe that we are on the right track however. 2. I am going to recommend that we have the patient continue to monitor for any signs of infection or worsening. I do believe that she should continue with the dressings as before and again that seems to be doing as good as anything at this point I think the primary issue is pressure to the back of her legs and obviously I think if we can do something to control that she would do better she is in physical therapy at this point we will see how things go. We will see patient back  for reevaluation in 2 weeks here in the clinic. If anything worsens or changes patient will contact our office for additional recommendations. Electronic Signature(s) Signed: 09/12/2022 1:08:03 PM By: Autumn Keeler PA-C Entered By: [name] on 09/12/2022 13:08:02 SuperBill Details -------------------------------------------------------------------------------- [name] ([id number]) [id number] Page 10 of 10 Patient Name: Date of Service: [name], [name] 09/12/2022 Medical Record Number: [id number] Patient Account Number: [id number] Date of Birth/Sex: Treating RN: 1956-04-03 (67 y.o. [name], Kim Primary Care Provider: Otilio [name] Other Clinician: Massie [name] Referring Provider: Treating Provider/Extender: [name] Weeks in Treatment: 66 Diagnosis Coding ICD-10 Codes Code Description I89.0 Lymphedema, not elsewhere classified E11.622 Type 2 diabetes mellitus with other skin ulcer E11.40 Type 2 diabetes mellitus with diabetic neuropathy, unspecified L89.893 Pressure ulcer  of other site, stage 3 L89.896 Pressure-induced deep tissue damage of other site L89.153 Pressure ulcer of sacral region, stage 3 I10 Essential (primary) hypertension E03.8 Other specified hypothyroidism E66.09 Other obesity due to excess calories Facility Procedures CPT4 Code Description Modifier Quantity [id number] - WOUND CARE VISIT-LEV 5 EST PT 1 Physician Procedures Quantity CPT4 Code Description Modifier [id number] - WC PHYS LEVEL 4 - EST PT 1 ICD-10 Diagnosis Description I89.0 Lymphedema, not elsewhere classified E11.622 Type 2 diabetes mellitus with other skin ulcer E11.40 Type 2 diabetes mellitus with diabetic neuropathy, unspecified L89.893 Pressure ulcer of other site, stage 3 Electronic Signature(s) Signed: 09/15/2022 12:55:04 PM By: Gretta Cool, BSN, RN, CWS, Kim RN, BSN Signed: 09/15/2022 5:06:45 PM By: Autumn Keeler PA-C Previous Signature: 09/14/2022 5:18:06 PM Version By: [name] Previous Signature: 09/12/2022 1:08:21 PM Version By: Autumn Keeler PA-C Entered By: Gretta Cool, BSN, RN, CWS, Kim on 09/15/2022 12:55:03

## 2022-09-13 DIAGNOSIS — M6281 Muscle weakness (generalized): Secondary | ICD-10-CM | POA: Diagnosis not present

## 2022-09-13 NOTE — Progress Notes (Signed)
BREAWNA, STROHSCHEIN ([id number]) [id number] Page 1 of 8 Visit Report for 09/12/2022 Arrival Information Details Patient Name: Date of Service: [name], [name] 09/12/2022 10:45 A M Medical Record Number: [id number] Patient Account Number: [id number] Date of Birth/Sex: Treating RN: 11-24-1955 (67 y.o. [name] Primary Care Sally-Anne Wamble: [name] Other Clinician: Massie Kluver Referring Abdirahman Chittum: Treating Nyleah Mcginnis/Extender: [name] Weeks in Treatment: 60 Visit Information History Since Last Visit All ordered tests and consults were completed: No Patient Arrived: Stretcher Added or deleted any medications: No Arrival Time: 11:10 Any new allergies or adverse reactions: No Transfer Assistance: Stretcher Had a fall or experienced change in No Patient Identification Verified: Yes activities of daily living that may affect Secondary Verification Process Completed: Yes risk of falls: Patient Requires Transmission-Based Precautions: No Signs or symptoms of abuse/neglect since last visito No Patient Has Alerts: Yes Hospitalized since last visit: No Patient Alerts: Patient on Blood Thinner Implantable device outside of the clinic excluding No TYPE II Diabetic cellular tissue based products placed in the center Plavix and Lovenox since last visit: [place] in Place as Prescribed: Yes Pain Present Now: No Electronic Signature(s) Signed: 09/13/2022 9:39:47 AM By: Massie Kluver Entered By: Massie Kluver on 09/12/2022 11:12:35 -------------------------------------------------------------------------------- Lower Extremity Assessment Details Patient Name: Date of Service: [name], [name] 09/12/2022 10:45 A M Medical Record Number: [id number] Patient Account Number: [id number] Date of Birth/Sex: Treating RN: [birth year] (67 y.o. [name] Primary Care Kerron Sedano: [name] Other Clinician: Massie Kluver Referring Travas Schexnayder: Treating Mirta Mally/Extender: [name] Weeks in Treatment: 47 Electronic Signature(s) Signed: 09/12/2022 5:25:35 PM By: Gretta Cool BSN, RN, CWS, Kim RN, BSN Signed: 09/13/2022 9:39:47 AM By: Massie Kluver Entered By: Massie Kluver on 09/12/2022 11:28:05 [name] ([id number]) [id number] Page 2 of 8 -------------------------------------------------------------------------------- Multi Wound Chart Details Patient Name: Date of Service: [name], [name] 09/12/2022 10:45 A M Medical Record Number: [id number] Patient Account Number: [id number] Date of Birth/Sex: Treating RN: July 09, 1955 (67 y.o. [name] Primary Care Luiza Carranco: [name] Other Clinician: Massie Kluver Referring Keeton Kassebaum: Treating Shabana Armentrout/Extender: [name] Weeks in Treatment: 88 Vital Signs Height(in): 75 Pulse(bpm): 75 Weight(lbs): 245.4 Blood Pressure(mmHg): 158/79 Body Mass Index(BMI): 30.7 Temperature(F): 97.6 Respiratory Rate(breaths/min): 18 [60:Photos: No Photos Right, Circumferential Lower Leg Wound Location: Pressure Injury Wounding Event: Pressure Ulcer Primary Etiology: Lymphedema, Hypertension, Comorbid History: Peripheral Arterial Disease, Peripheral Venous Disease, Type II Diabetes, End Stage  Renal Disease, Osteoarthritis, Neuropathy 05/17/2021 Date Acquired: 56 Weeks of Treatment: Open Wound Status: No Wound Recurrence: 18.5x16x0.1 Measurements L x W x D (cm) 232.478 A (cm) : rea 23.248 Volume (cm) : -41046.50% % Reduction in Area:  -40686.00% % Reduction in Volume: Category/Stage III Classification: Large Exudate A mount: Sanguinous Exudate Type: red Exudate Color: Medium (34-66%) Granulation A mount: Red, Pink Granulation Quality: Small (1-33%) Necrotic A mount: Fat Layer  (Subcutaneous Tissue): Yes Fat Layer (Subcutaneous Tissue): Yes Fat Layer (Subcutaneous Tissue): Yes Exposed Structures: Small  (1-33%) Epithelialization:] [61:No Photos Left, Posterior Lower Leg Gradually Appeared Lymphedema Lymphedema, Hypertension,  Venous Disease, Type II Diabetes, End Stage Renal Disease, Osteoarthritis, Neuropathy 06/11/2021 63 Open No 7.5x8.5x0.1 50.069 5.007 -57.40% -57.40% Full Thickness With Exposed Support Full Thickness Without Exposed Structures Large Serosanguineous red,  brown Medium (34-66%) Red Small (1-33%) None] [62:No Photos Left, Lateral Lower Leg Gradually Appeared Lymphedema Lymphedema, Hypertension, Peripheral Arterial Disease, Peripheral Venous Disease, Type II Diabetes, End Stage Renal Disease, Osteoarthritis,  Neuropathy 06/11/2021 63  Open No 18.5x8.5x0.1 123.504 12.35 -13109.00% -13179.60% Support Structures Large Sanguinous red Large (67-100%) Red, Pale Small (1-33%) None] Wound Number: 16 N/A N/A Photos: No Photos N/A N/A Right Calcaneus N/A N/A Wound Location: Pressure Injury N/A N/A Wounding Event: Pressure Ulcer N/A N/A Primary Etiology: Lymphedema, Hypertension, N/A N/A Comorbid History: Peripheral Arterial Disease, Peripheral Venous Disease, Type II Diabetes, End Stage Renal Disease, Osteoarthritis, Neuropathy 01/13/2022 N/A N/A Date Acquired: 31 N/A N/A Weeks of Treatment: Open N/A N/A Wound Status: No N/A N/A Wound Recurrence: 0.1x0.1x0.1 N/A N/A Measurements L x W x D (cm) 0.008 N/A N/A A (cm) : rea 0.001 N/A N/A Volume (cm) : 99.70% N/A N/A % Reduction in A rea: 99.60% N/A N/A % Reduction in Volume: Category/Stage II N/A N/A Classification: None Present N/A N/A Exudate A mount: N/A N/A N/A Exudate TypeDESTINI, TOKARZ ([id number]) [id number] Page 3 of 8 N/A N/A N/A Exudate Color: None Present (0%) N/A N/A Granulation A mount: N/A N/A N/A Granulation Quality: None Present (0%) N/A N/A Necrotic A mount: N/A N/A N/A Exposed Structures: Large (67-100%) N/A N/A Epithelialization: Treatment  Notes Electronic Signature(s) Signed: 09/13/2022 9:39:47 AM By: Massie Kluver Entered By: Massie Kluver on 09/12/2022 11:28:13 -------------------------------------------------------------------------------- Pain Assessment Details Patient Name: Date of Service: [name], [name] 09/12/2022 10:45 A M Medical Record Number: [id number] Patient Account Number: [id number] Date of Birth/Sex: Treating RN: Mar 23, 1956 (67 y.o. [name] Primary Care Charla Criscione: [name] Other Clinician: Massie Kluver Referring Clenton Esper: Treating Payten Beaumier/Extender: [name] Weeks in Treatment: 66 Active Problems Location of Pain Severity and Description of Pain Patient Has Paino No Site Locations Pain Management and Medication Current Pain Management: Electronic Signature(s) Signed: 09/12/2022 5:25:35 PM By: Gretta Cool, BSN, RN, CWS, Kim RN, BSN Signed: 09/13/2022 9:39:47 AM By: Massie Kluver Entered By: Massie Kluver on 09/12/2022 11:13:30 [name] ([id number]) [id number].pdf Page 4 of 8 -------------------------------------------------------------------------------- Wound Assessment Details Patient Name: Date of Service: [name], [name] 09/12/2022 10:45 A M Medical Record Number: [id number] Patient Account Number: [id number] Date of Birth/Sex: Treating RN: 1956-03-24 (67 y.o. [name], Kim Primary Care Chandlar Guice: [name] Other Clinician: Massie Kluver Referring Jamelah Sitzer: Treating Karmine Kauer/Extender: [name] Weeks in Treatment: 66 Wound Status Wound Number: 60 Primary Pressure Ulcer Etiology: Wound Location: Right, Circumferential Lower Leg Wound Open Wounding Event: Pressure Injury Status: Date Acquired: 05/17/2021 Comorbid Lymphedema, Hypertension, Peripheral Arterial Disease, Peripheral Weeks Of Treatment: 66 History: Venous Disease, Type II Diabetes, End Stage Renal Disease, Clustered Wound: No Osteoarthritis,  Neuropathy Photos Wound Measurements Length: (cm) 18.5 Width: (cm) 16 Depth: (cm) 0.1 Area: (cm) 232.478 Volume: (cm) 23.248 % Reduction in Area: -41046.5% % Reduction in Volume: -40686% Epithelialization: Small (1-33%) Tunneling: No Undermining: No Wound Description Classification: Category/Stage III Exudate Amount: Large Exudate Type: Sanguinous Exudate Color: red Foul Odor After Cleansing: No Slough/Fibrino Yes Wound Bed Granulation Amount: Medium (34-66%) Exposed Structure Granulation Quality: Red, Pink Fat Layer (Subcutaneous Tissue) Exposed: Yes Necrotic Amount: Small (1-33%) Necrotic Quality: Adherent Therapist, music) Signed: 09/12/2022 5:25:35 PM By: Gretta Cool, BSN, RN, CWS, Kim RN, BSN Signed: 09/13/2022 9:39:47 AM By: Massie Kluver Entered By: Massie Kluver on 09/12/2022 11:50:50 [name] ([id number]) [id number].pdf Page 5 of 8 -------------------------------------------------------------------------------- Wound Assessment Details Patient Name: Date of Service: [name], [name] 09/12/2022 10:45 A M Medical Record Number: [id number] Patient Account Number: [id number] Date of Birth/Sex: Treating RN: 12/27/1955 (67 y.o. [name] Primary Care Shail Urbas: [name] Other Clinician: Massie Kluver Referring Javante Nilsson:  Treating Ziona Wickens/Extender: [name] Weeks in Treatment: 66 Wound Status Wound Number: 61 Primary Lymphedema Etiology: Wound Location: Left, Posterior Lower Leg Wound Open Wounding Event: Gradually Appeared Status: Date Acquired: 06/11/2021 Comorbid Lymphedema, Hypertension, Peripheral Arterial Disease, Peripheral Weeks Of Treatment: 63 History: Venous Disease, Type II Diabetes, End Stage Renal Disease, Clustered Wound: No Osteoarthritis, Neuropathy Photos Wound Measurements Length: (cm) 7.5 Width: (cm) 8.5 Depth: (cm) 0.1 Area: (cm) 50.069 Volume: (cm) 5.007 %  Reduction in Area: -57.4% % Reduction in Volume: -57.4% Epithelialization: None Tunneling: No Undermining: No Wound Description Classification: Full Thickness With Exposed Support Structures Exudate Amount: Large Exudate Type: Serosanguineous Exudate Color: red, brown Foul Odor After Cleansing: No Slough/Fibrino Yes Wound Bed Granulation Amount: Medium (34-66%) Exposed Structure Granulation Quality: Red Fat Layer (Subcutaneous Tissue) Exposed: Yes Necrotic Amount: Small (1-33%) Necrotic Quality: Adherent Therapist, music) Signed: 09/12/2022 5:25:35 PM By: Gretta Cool, BSN, RN, CWS, Kim RN, BSN Signed: 09/13/2022 9:39:47 AM By: Massie Kluver Entered By: Massie Kluver on 09/12/2022 11:51:18 [name] ([id number]) [id number].pdf Page 6 of 8 -------------------------------------------------------------------------------- Wound Assessment Details Patient Name: Date of Service: [name], [name] 09/12/2022 10:45 A M Medical Record Number: [id number] Patient Account Number: [id number] Date of Birth/Sex: Treating RN: [birth year] (67 y.o. [name], Kim Primary Care Pascale Maves: [name] Other Clinician: Massie Kluver Referring Jontae Adebayo: Treating Phallon Haydu/Extender: [name] Weeks in Treatment: 66 Wound Status Wound Number: 62 Primary Lymphedema Etiology: Wound Location: Left, Lateral Lower Leg Wound Open Wounding Event: Gradually Appeared Status: Date Acquired: 06/11/2021 Comorbid Lymphedema, Hypertension, Peripheral Arterial Disease, Peripheral Weeks Of Treatment: 63 History: Venous Disease, Type II Diabetes, End Stage Renal Disease, Clustered Wound: No Osteoarthritis, Neuropathy Photos Wound Measurements Length: (cm) 18.5 Width: (cm) 8.5 Depth: (cm) 0.1 Area: (cm) 123.504 Volume: (cm) 12.35 % Reduction in Area: -13109% % Reduction in Volume: -13179.6% Epithelialization: None Tunneling: No Undermining:  No Wound Description Classification: Full Thickness Without Exposed Suppor Exudate Amount: Large Exudate Type: Sanguinous Exudate Color: red t Structures Foul Odor After Cleansing: No Slough/Fibrino Yes Wound Bed Granulation Amount: Large (67-100%) Exposed Structure Granulation Quality: Red, Pale Fat Layer (Subcutaneous Tissue) Exposed: Yes Necrotic Amount: Small (1-33%) Necrotic Quality: Adherent Therapist, music) Signed: 09/12/2022 5:25:35 PM By: Gretta Cool, BSN, RN, CWS, Kim RN, BSN Signed: 09/13/2022 9:39:47 AM By: Massie Kluver Entered By: Massie Kluver on 09/12/2022 11:51:52 [name] ([id number]) [id number].pdf Page 7 of 8 -------------------------------------------------------------------------------- Wound Assessment Details Patient Name: Date of Service: [name], [name] 09/12/2022 10:45 A M Medical Record Number: [id number] Patient Account Number: [id number] Date of Birth/Sex: Treating RN: 1955/07/15 (67 y.o. [name], Kim Primary Care Yasenia Reedy: [name] Other Clinician: Massie Kluver Referring Saraphina Lauderbaugh: Treating Willet Schleifer/Extender: [name] Weeks in Treatment: 66 Wound Status Wound Number: 67 Primary Pressure Ulcer Etiology: Wound Location: Right Calcaneus Wound Healed - Epithelialized Wounding Event: Pressure Injury Status: Date Acquired: 01/13/2022 Comorbid Lymphedema, Hypertension, Peripheral Arterial Disease, Peripheral Weeks Of Treatment: 31 History: Venous Disease, Type II Diabetes, End Stage Renal Disease, Clustered Wound: No Osteoarthritis, Neuropathy Wound Measurements Length: (cm) Width: (cm) Depth: (cm) Area: (cm) Volume: (cm) 0 % Reduction in Area: 100% 0 % Reduction in Volume: 100% 0 Epithelialization: Large (67-100%) 0 Tunneling: No 0 Undermining: No Wound Description Classification: Category/Stage II Exudate Amount: None Present Foul Odor After Cleansing:  No Slough/Fibrino No Wound Bed Granulation Amount: None Present (0%) Necrotic Amount: None Present (0%) Electronic Signature(s) Signed: 09/12/2022 5:25:35 PM By: Gretta Cool, BSN, RN, CWS, Kim RN, BSN Signed:  09/13/2022 9:39:47 AM By: Massie Kluver Entered By: Massie Kluver on 09/12/2022 11:31:51 -------------------------------------------------------------------------------- Vitals Details Patient Name: Date of Service: Autumn Asal. 09/12/2022 10:45 A M Medical Record Number: [id number] Patient Account Number: [id number] Date of Birth/Sex: Treating RN: [birth year] (67 y.o. [name] Primary Care Stanford Strauch: [name] Other Clinician: Massie Kluver Referring Jolayne Branson: Treating Haleigh Desmith/Extender: [name] Weeks in Treatment: [address] [name], [name] ([id number]) [id number].pdf Page 8 of 8 Time Taken: 11:13 Temperature (F): 97.6 Height (in): 75 Pulse (bpm): 75 Weight (lbs): 245.4 Respiratory Rate (breaths/min): 18 Body Mass Index (BMI): 30.7 Blood Pressure (mmHg): 158/79 Reference Range: 80 - 120 mg / dl Electronic Signature(s) Signed: 09/13/2022 9:39:47 AM By: Massie Kluver Entered By: Massie Kluver on 09/12/2022 11:13:23

## 2022-09-14 DIAGNOSIS — M6281 Muscle weakness (generalized): Secondary | ICD-10-CM | POA: Diagnosis not present

## 2022-09-15 LAB — AEROBIC CULTURE W GRAM STAIN (SUPERFICIAL SPECIMEN): Gram Stain: NONE SEEN

## 2022-09-16 DIAGNOSIS — M6281 Muscle weakness (generalized): Secondary | ICD-10-CM | POA: Diagnosis not present

## 2022-09-17 DIAGNOSIS — M6281 Muscle weakness (generalized): Secondary | ICD-10-CM | POA: Diagnosis not present

## 2022-09-19 DIAGNOSIS — M6281 Muscle weakness (generalized): Secondary | ICD-10-CM | POA: Diagnosis not present

## 2022-09-20 DIAGNOSIS — M6281 Muscle weakness (generalized): Secondary | ICD-10-CM | POA: Diagnosis not present

## 2022-09-21 DIAGNOSIS — M6281 Muscle weakness (generalized): Secondary | ICD-10-CM | POA: Diagnosis not present

## 2022-09-23 DIAGNOSIS — M6281 Muscle weakness (generalized): Secondary | ICD-10-CM | POA: Diagnosis not present

## 2022-09-24 DIAGNOSIS — M6281 Muscle weakness (generalized): Secondary | ICD-10-CM | POA: Diagnosis not present

## 2022-09-26 DIAGNOSIS — M6281 Muscle weakness (generalized): Secondary | ICD-10-CM | POA: Diagnosis not present

## 2022-09-27 ENCOUNTER — Encounter: Payer: Medicare HMO | Attending: Physician Assistant | Admitting: Physician Assistant

## 2022-09-27 DIAGNOSIS — M6281 Muscle weakness (generalized): Secondary | ICD-10-CM | POA: Diagnosis not present

## 2022-09-28 DIAGNOSIS — M6281 Muscle weakness (generalized): Secondary | ICD-10-CM | POA: Diagnosis not present

## 2022-09-29 DIAGNOSIS — M6281 Muscle weakness (generalized): Secondary | ICD-10-CM | POA: Diagnosis not present

## 2022-09-29 DIAGNOSIS — L89893 Pressure ulcer of other site, stage 3: Secondary | ICD-10-CM | POA: Diagnosis not present

## 2022-09-30 DIAGNOSIS — M6281 Muscle weakness (generalized): Secondary | ICD-10-CM | POA: Diagnosis not present

## 2022-10-03 DIAGNOSIS — M6281 Muscle weakness (generalized): Secondary | ICD-10-CM | POA: Diagnosis not present

## 2022-10-04 DIAGNOSIS — M6281 Muscle weakness (generalized): Secondary | ICD-10-CM | POA: Diagnosis not present

## 2022-10-05 DIAGNOSIS — M6281 Muscle weakness (generalized): Secondary | ICD-10-CM | POA: Diagnosis not present

## 2022-10-06 DIAGNOSIS — M6281 Muscle weakness (generalized): Secondary | ICD-10-CM | POA: Diagnosis not present

## 2022-10-08 DIAGNOSIS — E1169 Type 2 diabetes mellitus with other specified complication: Secondary | ICD-10-CM | POA: Diagnosis not present

## 2022-10-08 DIAGNOSIS — M6281 Muscle weakness (generalized): Secondary | ICD-10-CM | POA: Diagnosis not present

## 2022-10-10 DIAGNOSIS — M6281 Muscle weakness (generalized): Secondary | ICD-10-CM | POA: Diagnosis not present

## 2022-10-10 DIAGNOSIS — E119 Type 2 diabetes mellitus without complications: Secondary | ICD-10-CM | POA: Diagnosis not present

## 2022-10-11 ENCOUNTER — Ambulatory Visit: Payer: Medicare HMO | Admitting: Physician Assistant

## 2022-10-11 DIAGNOSIS — M6281 Muscle weakness (generalized): Secondary | ICD-10-CM | POA: Diagnosis not present

## 2022-10-12 DIAGNOSIS — M6281 Muscle weakness (generalized): Secondary | ICD-10-CM | POA: Diagnosis not present

## 2022-10-13 ENCOUNTER — Ambulatory Visit: Payer: Medicare HMO | Admitting: Physician Assistant

## 2022-10-13 DIAGNOSIS — E119 Type 2 diabetes mellitus without complications: Secondary | ICD-10-CM | POA: Diagnosis not present

## 2022-10-13 DIAGNOSIS — I509 Heart failure, unspecified: Secondary | ICD-10-CM | POA: Diagnosis not present

## 2022-10-13 DIAGNOSIS — I10 Essential (primary) hypertension: Secondary | ICD-10-CM | POA: Diagnosis not present

## 2022-10-13 DIAGNOSIS — M6281 Muscle weakness (generalized): Secondary | ICD-10-CM | POA: Diagnosis not present

## 2022-10-14 ENCOUNTER — Ambulatory Visit: Payer: Medicare HMO | Admitting: Physician Assistant

## 2022-10-14 DIAGNOSIS — M6281 Muscle weakness (generalized): Secondary | ICD-10-CM | POA: Diagnosis not present

## 2022-10-17 DIAGNOSIS — E559 Vitamin D deficiency, unspecified: Secondary | ICD-10-CM | POA: Diagnosis not present

## 2022-10-17 DIAGNOSIS — M6281 Muscle weakness (generalized): Secondary | ICD-10-CM | POA: Diagnosis not present

## 2022-10-17 DIAGNOSIS — E119 Type 2 diabetes mellitus without complications: Secondary | ICD-10-CM | POA: Diagnosis not present

## 2022-10-18 ENCOUNTER — Ambulatory Visit: Payer: Medicare HMO | Admitting: Physician Assistant

## 2022-10-18 DIAGNOSIS — M6281 Muscle weakness (generalized): Secondary | ICD-10-CM | POA: Diagnosis not present

## 2022-10-19 DIAGNOSIS — M6281 Muscle weakness (generalized): Secondary | ICD-10-CM | POA: Diagnosis not present

## 2022-10-21 DIAGNOSIS — M6281 Muscle weakness (generalized): Secondary | ICD-10-CM | POA: Diagnosis not present

## 2022-10-22 DIAGNOSIS — M6281 Muscle weakness (generalized): Secondary | ICD-10-CM | POA: Diagnosis not present

## 2022-10-24 ENCOUNTER — Encounter: Payer: Medicare HMO | Attending: Physician Assistant | Admitting: Physician Assistant

## 2022-10-24 DIAGNOSIS — L97222 Non-pressure chronic ulcer of left calf with fat layer exposed: Secondary | ICD-10-CM | POA: Diagnosis not present

## 2022-10-24 DIAGNOSIS — L97822 Non-pressure chronic ulcer of other part of left lower leg with fat layer exposed: Secondary | ICD-10-CM | POA: Diagnosis not present

## 2022-10-24 DIAGNOSIS — E1122 Type 2 diabetes mellitus with diabetic chronic kidney disease: Secondary | ICD-10-CM | POA: Insufficient documentation

## 2022-10-24 DIAGNOSIS — L89153 Pressure ulcer of sacral region, stage 3: Secondary | ICD-10-CM | POA: Diagnosis not present

## 2022-10-24 DIAGNOSIS — I890 Lymphedema, not elsewhere classified: Secondary | ICD-10-CM | POA: Diagnosis not present

## 2022-10-24 DIAGNOSIS — E668 Other obesity: Secondary | ICD-10-CM | POA: Insufficient documentation

## 2022-10-24 DIAGNOSIS — N186 End stage renal disease: Secondary | ICD-10-CM | POA: Insufficient documentation

## 2022-10-24 DIAGNOSIS — E1140 Type 2 diabetes mellitus with diabetic neuropathy, unspecified: Secondary | ICD-10-CM | POA: Insufficient documentation

## 2022-10-24 DIAGNOSIS — I120 Hypertensive chronic kidney disease with stage 5 chronic kidney disease or end stage renal disease: Secondary | ICD-10-CM | POA: Diagnosis not present

## 2022-10-24 DIAGNOSIS — L89893 Pressure ulcer of other site, stage 3: Secondary | ICD-10-CM | POA: Diagnosis not present

## 2022-10-24 DIAGNOSIS — I10 Essential (primary) hypertension: Secondary | ICD-10-CM | POA: Diagnosis not present

## 2022-10-24 DIAGNOSIS — Z743 Need for continuous supervision: Secondary | ICD-10-CM | POA: Diagnosis not present

## 2022-10-24 DIAGNOSIS — L89896 Pressure-induced deep tissue damage of other site: Secondary | ICD-10-CM | POA: Insufficient documentation

## 2022-10-24 DIAGNOSIS — M6281 Muscle weakness (generalized): Secondary | ICD-10-CM | POA: Diagnosis not present

## 2022-10-24 DIAGNOSIS — R531 Weakness: Secondary | ICD-10-CM | POA: Diagnosis not present

## 2022-10-24 DIAGNOSIS — Z7401 Bed confinement status: Secondary | ICD-10-CM | POA: Diagnosis not present

## 2022-10-24 DIAGNOSIS — E038 Other specified hypothyroidism: Secondary | ICD-10-CM | POA: Diagnosis not present

## 2022-10-24 NOTE — Progress Notes (Signed)
Autumn, [name] ([id number]) [id number] Page 1 of 9 Visit Report for 10/24/2022 Chief Complaint Document Details Patient Name: Date of Service: Autumn, [name] 10/24/2022 10:45 A M Medical Record Number: [id number] Patient Account Number: [id number] Date of Birth/Sex: Treating RN: August 26, 1955 (67 y.o. 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 New sacral wound as of 10/29/21 Electronic Signature(s) Signed: 10/24/2022 10:45:46 AM By: Allen Derry PA-C Entered By: Allen Derry on 10/24/2022 10:45:46 -------------------------------------------------------------------------------- HPI Details Patient Name: Date of Service: [name]. 10/24/2022 10:45 A M Medical Record Number: [id number] Patient Account Number: [id number] Date of Birth/Sex: Treating RN: [birth year] (67 y.o. [name] Primary Care Provider: Elizabeth Sauer Other Clinician: Betha Loa Referring Provider: Treating Provider/Extender: Althia Forts Weeks in Treatment: 72 History of Present Illness HPI Description: 67yo w/ h/o BLE phlebolymphedema, type 2 DM (unknown hemoglobin A1c), and obesity. No PVD. h/o chronic, recurrent BLE calf ulcers. Treated with 4 layer compression. Infrequently uses lymphedema pump. Bilateral lower extremity ulcerations healed as of June 2016. Fitted for custom compression stockings but did not receive them. Patient could not travel for lymphedema consult. She developed recurrent bilateral lower extremity ulcerations in August 2016. She is able to heal these quite quickly with 4 layer elastic compression bandages. However, she is not very compliant using her juxtalite  compression garments and tends to develop recurrent ulcerations while using juxtalite. Unna boots were applied this past week. She tolerated this well but performs minimal ambulation. She is scheduled to undergo total hip replacement tomorrow with Dr. Rosita Kea. She is unsure if he is aware of her ulcerations. She is otherwise without complaints. No significant pain. No fever or chills. Moderate clear drainage. Not on any antibiotics. 06/04/2015 -- she did have her hip surgery and has now been in rehabilitation and they're controlling her fluid intake and diuretics to the extent that she has lost 13 pounds of weight and her lower extremity circumference is decreased by 7 cm. 06/18/2015 -- she is still in the rehabilitation facility and continues to be looked after well and has lost a total of 15 pounds and her diabetes is under much better control. Readmission: 06/03/2021 upon evaluation today patient actually appears to be doing somewhat poorly in regard to a wound on the right posterior lower leg. Patient does have a history of previously having been seen here in the clinic but this predates my time here back in 2017. Subsequently unfortunately right now she is having a significant issue with bleeding in the posterior of her right calf this appears to be a deep tissue injury as developed into an ulceration. She does have a history significant for lymphedema, diabetes mellitus type 2, diabetic neuropathy, hypertension, hypothyroidism, and obesity. She is currently in a skilled nursing facility. The dressing that she had in place during evaluation today was Xeroform followed by significant amount of gauze and this was severely stuck. Upon removal the patient was having significant bleeding she also had significant work done in the hospital in regard to her blood flow and Dr. Wyn Quaker does look like he has her on 2 blood thinners Plavix and Lovenox. This is probably part of the reason why she is bleeding so  significantly. With that being said I do believe that based on what we are seeing currently this is probably  can to be something that were getting have to try to get stopped with more than just pressure to the region currently. I think surgery foam is probably can to be necessary. If we can get the stop of the Surgifoam she is probably can have to go to the hospital to try to get this bleeding stopped. 12/30; this is a patient who is currently in a skilled facility. She has wounds on her bilateral calfs likely secondary to severe chronic venous insufficiency and lymphedema the care of these wounds is complicated by the fact that she is on Lovenox and Plavix and bleeds very frequently with even minimal trauma to these wounds. It would appear that she had a Vaseline gauze on the surface of these in the process of undressing them she bled freely from especially the right but also the left. I had to go in and use silver nitrate. The skin on her legs is significant for chronic venous stasis and lymphedema. Very dry flaking skin 07/16/2021; I actually was a patent person who saw this woman 3 weeks ago. She is a nursing facility has chronic venous wounds and bilateral lower extremity wounds. We use Xeroform under compression. The time she was on Lovenox and Plavix I thought I had an excellent reason for these wounds to bleed so freely. Since then the Lovenox has been stopped and should be well out of her system but she is still on Plavix. She still has very friable bleeding wounds on ANVI, MANGAL ([id number]) [phone].pdf Page 2 of 9 the left lateral left Posterior and right Posterior lower extremity.These believe very freely when we take off the dressing Also she has a left heel wound which when we saw her last time was a surgical wound however I gather we have been asked to follow this as well. I am not exactly sure at this time what her surgery was but she comes in with a  wound VAC on this and they are apparently changing it at the nursing home. 08/27/2021 upon evaluation patient appears to be doing better in regard to her wound on the legs as well as the heel. Fortunately at this point I do not see any need for a wound VAC which is good news overall I think that the Xeroform is probably a good option that is what is being utilized here as well. Fortunately I do not see any signs of active infection locally nor systemically which is great news and overall I think that the patient is making excellent progress. The area on her right gluteal area actually appears to be doing quite well and I am pleased with that. I do believe the silver alginate dressing here could be of benefit for her. 09/17/2021 upon evaluation patient appears to be doing actually better in regard to her wounds on the legs. Fortunately she does not seem to be showing any signs of worsening which is great news and overall very pleased with where we stand. I do believe that she is tolerating the dressing changes without complication which is great news. 10-08-2021 upon evaluation today patient unfortunately continues to have significant issues with her legs. Unfortunately the biggest issue that we see Currently is that she is still having a lot of bleeding from her legs that she is also not able to move well due to her knee pain her left knee is quite significant as far as the discomfort is concerned and the right knee is getting equally as bad. Fortunately I do  not see any evidence of active infection locally or systemically which is great news. Nonetheless I do believe that her heels are looking much better there is a tiny area open on the left heel the right is doing quite well. In regard to the gluteal/sacral region this appears to be healed. 10-29-2021 upon evaluation today patient unfortunately is appearing to do much worse compared to normal. She has more breakdown in regard to the left lateral leg and  again I think this is pressure related. She also has more breakdown in regard to the gluteal area and she is very concerned about this in particular which is completely understandable. With that being said I do believe that she would benefit from more aggressive and appropriate offloading I discussed with her that she needs to allow the facility to change her position every 2 hours and she is can have to find different positions other than just lying on her back. Also discussed with her that obviously the facility needs to make sure to help her with repositioning every 2 hours I think both aspects of this are important. Otherwise her wound on the sacral area is going to continue to get worse and the leg will continue to get worse. 11-26-2021 upon evaluation today patient appears to be doing well with regard to her wounds all things considered. The gluteal areas are all healed which is great news. With that being said she is still having some issues here with her lower extremities. I do think that this is still something that we need to be very mindful of. Unfortunately the facility is still not washing her legs she has a lot of caked on dry skin with Xeroform we will get a try to get some of that off today. With that being said he does have reiterated in the past the patient needs to have her legs washed regularly. 12-24-2021 upon evaluation patient's wounds are showing signs of improvement albeit slowly. Fortunately I do not see any signs of infection locally or systemically which is great news. Nonetheless I do believe that the patient is doing quite well from the standpoint of the overall appearance of her wounds which are better than where they have been even if not where we would like them to be. 02-03-2022 upon evaluation today patient appears to be doing about the same in regard to her wounds. There is maybe a little bit of improvement in general based on what I am seeing currently but not much to be  honest. I do think that the patient is making some progress here when it comes to the overall appearance of her wounds. 04-25-2022 upon evaluation patient actually is here for the first time in about 2-1/2 months. She has had multiple reasons why she did not make it part of the time she was sick and some of the times it was more of a transportation thing. Nonetheless I am glad that she is finally here good news is some of the wounds do appear to be a little bit drier and she has been using a alginate dressing at the facility which they switched her to honestly I cannot argue with the fact that it seems to be doing somewhat better in my opinion. With that being said I do believe one of the biggest issues that I see is simply that she is still having some issues here with pressure although I do not see as much of the dark discoloration as we previously noted she has been  using the Prevalon offloading boots which is excellent. I want her to keep using that as I think that still good to be ideal and optimal for her. She does inquire about being able to put pressure on her heels at this point. 06-02-2022 upon evaluation today patient appears to be unfortunately still having a lot of issues with her legs. Were not seeing a whole lot of improvement she is not as bad as she has been at some point in time but she is also not as good as we would like to see. 07-18-2022 upon evaluation today patient appears to be doing well currently in regard to her wounds on the legs all things considered. This is actually showing signs of improvement compared to where we have been. Overall I am extremely pleased in that regard. She does have a small area open on her sacral region but this is minimal at this point and actually just more of an irritation at the just the topical barrier cream is ideal here. 08-15-2022 upon evaluation today patient appears to be doing well currently in regard to her wounds all things considered. This  is actually one of the better times that I have seen her as far as the overall appearance is concerned I think her heel is doing better she really does not allow me to do much with that as it hurts so badly. Her legs also doing quite well although extremely hyper granulated with the Xeroform gauze. I think it would be worthwhile trying the Sorbact to see if this could be beneficial she does not remember doing this in the past. I think it could prevent it from sticking and then subsequently could also help pull away some of the drainage which will be helpful as well. 09-12-2022 upon evaluation today patient appears to be doing well currently in regard to her legs the left is better than the right on the right I am concerned a little bit about the possibility of infection I discussed that with her today. With that being said I think that we may want to obtain a wound culture and we are going to go ahead and grab that today. 10-24-2022 upon evaluation today patient appears to be doing actually okay in some respects with regard to her legs currently. Fortunately there does not appear to be any signs of active infection locally nor systemically which is great news. No fevers, chills, nausea, vomiting, or diarrhea. Electronic Signature(s) Signed: 10/24/2022 6:26:54 PM By: Allen Derry PA-C Entered By: Allen Derry on 10/24/2022 18:26:54 -------------------------------------------------------------------------------- Physical Exam Details Patient Name: Date of Service: [name], [name] 10/24/2022 10:45 A M Medical Record Number: [id number] Patient Account Number: [id number] Date of Birth/Sex: Treating RN: 11/18/1955 (67 y.o. [name] Primary Care Provider: Elizabeth Sauer Other Clinician: Betha Loa Referring Provider: Treating Provider/Extender: Althia Forts Weeks in Treatment: 72 Constitutional Obese and well-hydrated in no acute distress. ROBYNE, MATAR ([id number])  [id number] Page 3 of 9 Respiratory normal breathing without difficulty. Psychiatric this patient is able to make decisions and demonstrates good insight into disease process. Alert and Oriented x 3. pleasant and cooperative. Notes Upon inspection patient's wound bed actually showed signs of good granulation epithelization at this point. Fortunately I do not see any signs of active infection locally nor systemically which is great news. The patient does have areas of hypergranulation but I think a big portion of her problem is that at the facility her legs are not getting cleaned well enough.  I made a note of that today and we did clean her legs pretty well here in the office today. We got the majority of the dead skin off which her legs look tremendously better following and I think that is what needs to be done on a regular basis at the facility as well. I discussed this with the patient and we sent a note back as well. Electronic Signature(s) Signed: 10/24/2022 6:27:48 PM By: Allen Derry PA-C Entered By: Allen Derry on 10/24/2022 18:27:48 -------------------------------------------------------------------------------- Physician Orders Details Patient Name: Date of Service: [name]. 10/24/2022 10:45 A M Medical Record Number: [id number] Patient Account Number: [id number] Date of Birth/Sex: Treating RN: 07-24-1955 (67 y.o. [name] Primary Care Provider: Elizabeth Sauer Other Clinician: Betha Loa Referring Provider: Treating Provider/Extender: Althia Forts Weeks in Treatment: 38 Verbal / Phone Orders: Yes Clinician: Angelina Pih Read Back and Verified: Yes Diagnosis Coding ICD-10 Coding Code Description I89.0 Lymphedema, not elsewhere classified E11.622 Type 2 diabetes mellitus with other skin ulcer E11.40 Type 2 diabetes mellitus with diabetic neuropathy, unspecified L89.893 Pressure ulcer of other site, stage  3 L89.896 Pressure-induced deep tissue damage of other site L89.153 Pressure ulcer of sacral region, stage 3 I10 Essential (primary) hypertension E03.8 Other specified hypothyroidism E66.09 Other obesity due to excess calories Follow-up Appointments Wound #60 Right,Circumferential Lower Leg Return Appointment in 1 month Bathing/ Shower/ Hygiene Wash wounds with antibacterial soap and water. - WASH LEGS WITH SOAP and WATER before applying dressings. No tub bath. Edema Control - Lymphedema / Segmental Compressive Device / Other Other: - Lymphedema-use ace wraps to help control swelling. Off-Loading Turn and reposition every 2 hours - PLEASE MAKE SURE THIS IS COMPLETED EVERY TWO HOURS THANK YOU Other: - Put pillows under knees to keep pressure off of calves. Deep tissue injury on posterior calves. Turn on side every 1 hous to keep pressure off calves wear offloading boots Additional Orders / Instructions Follow Nutritious Diet and Increase Protein Intake Wound Treatment Wound #60 - Lower Leg Wound Laterality: Right, Circumferential Cleanser: Normal Saline 3 x Per Week/30 Days Discharge Instructions: Wash your hands with soap and water. Remove old dressing, discard into plastic bag and place into trash. Cleanse the wound with Normal Saline prior to applying a clean dressing using gauze sponges, not tissues or cotton balls. Do not scrub or use excessive force. Pat dry using gauze sponges, not tissue or cotton balls. Peri-Wound Care: AandD Ointment 3 x Per Week/30 Days Discharge Instructions: Apply AandD Ointment to entire lower legs including wounds [name], [name] ([id number]) [id number] Page 4 of 9 Prim Dressing: Cutimed Sorbact 1.5x 2.38 (in/in) 3 x Per Week/30 Days ary Discharge Instructions: A bacteria- and fungi binding wound dressing, suitable for cavities and fistulas. It is suitable as a wound filler and allows the passage of wound exudate into a  secondary dressing. The dressing helps reducing odor and pain and can improve healing. Secondary Dressing: ABD Pad 5x9 (in/in) 3 x Per Week/30 Days Discharge Instructions: 2. Cover with ABD pad Secured With: ACE WRAP - 58M ACE Elastic Bandage With VELCRO Brand Closure, 4 (in) 3 x Per Week/30 Days Discharge Instructions: 4. Ace wrap to secure dressing in place and control swelling. Secured With: [employer] or Non-Sterile 6-ply 4.5x4 (yd/yd) 3 x Per Week/30 Days Discharge Instructions: 3. Apply Kerlix as directed Wound #61 - Lower Leg Wound Laterality: Left, Posterior Cleanser: Normal Saline 3 x Per Week/30 Days Discharge Instructions: Wash your hands with soap  and water. Remove old dressing, discard into plastic bag and place into trash. Cleanse the wound with Normal Saline prior to applying a clean dressing using gauze sponges, not tissues or cotton balls. Do not scrub or use excessive force. Pat dry using gauze sponges, not tissue or cotton balls. Peri-Wound Care: AandD Ointment 3 x Per Week/30 Days Discharge Instructions: Apply AandD Ointment to entire lower legs including wounds Prim Dressing: Cutimed Sorbact 1.5x 2.38 (in/in) 3 x Per Week/30 Days ary Discharge Instructions: A bacteria- and fungi binding wound dressing, suitable for cavities and fistulas. It is suitable as a wound filler and allows the passage of wound exudate into a secondary dressing. The dressing helps reducing odor and pain and can improve healing. Secondary Dressing: ABD Pad 5x9 (in/in) 3 x Per Week/30 Days Discharge Instructions: 2. Cover with ABD pad Secured With: ACE WRAP - 5M ACE Elastic Bandage With VELCRO Brand Closure, 4 (in) 3 x Per Week/30 Days Discharge Instructions: 4. Ace wrap to secure dressing in place and control swelling. Secured With: [employer] or Non-Sterile 6-ply 4.5x4 (yd/yd) 3 x Per Week/30 Days Discharge Instructions: 3. Apply Kerlix as directed Wound #62 - Lower Leg Wound  Laterality: Left, Lateral Cleanser: Normal Saline 3 x Per Week/30 Days Discharge Instructions: Wash your hands with soap and water. Remove old dressing, discard into plastic bag and place into trash. Cleanse the wound with Normal Saline prior to applying a clean dressing using gauze sponges, not tissues or cotton balls. Do not scrub or use excessive force. Pat dry using gauze sponges, not tissue or cotton balls. Peri-Wound Care: AandD Ointment 3 x Per Week/30 Days Discharge Instructions: Apply AandD Ointment to entire lower legs including wounds Prim Dressing: Cutimed Sorbact 1.5x 2.38 (in/in) 3 x Per Week/30 Days ary Discharge Instructions: A bacteria- and fungi binding wound dressing, suitable for cavities and fistulas. It is suitable as a wound filler and allows the passage of wound exudate into a secondary dressing. The dressing helps reducing odor and pain and can improve healing. Secondary Dressing: ABD Pad 5x9 (in/in) 3 x Per Week/30 Days Discharge Instructions: 2. Cover with ABD pad Secured With: ACE WRAP - 5M ACE Elastic Bandage With VELCRO Brand Closure, 4 (in) 3 x Per Week/30 Days Discharge Instructions: 4. Ace wrap to secure dressing in place and control swelling. Secured With: [employer] or Non-Sterile 6-ply 4.5x4 (yd/yd) 3 x Per Week/30 Days Discharge Instructions: 3. Apply Kerlix as directed Electronic Signature(s) Signed: 10/24/2022 6:40:35 PM By: Allen Derry PA-C Signed: 10/25/2022 5:12:01 PM By: Betha Loa Entered By: Betha Loa on 10/24/2022 12:03:19 -------------------------------------------------------------------------------- Problem List Details Patient Name: Date of Service: [name]. 10/24/2022 10:45 A M Medical Record Number: [id number] Patient Account Number: [id number] Date of Birth/Sex: Treating RN: 01-17-1956 (67 y.o. [name] Primary Care Provider: Elizabeth Sauer Other Clinician: Betha Loa Referring Provider: Treating  Provider/Extender: Althia Forts Weeks in Treatment: [address], Yilin [name] ([id number]) [id number] Page 5 of 9 Active Problems ICD-10 Encounter Code Description Active Date MDM Diagnosis I89.0 Lymphedema, not elsewhere classified 06/03/2021 No Yes E11.622 Type 2 diabetes mellitus with other skin ulcer 06/03/2021 No Yes E11.40 Type 2 diabetes mellitus with diabetic neuropathy, unspecified 06/03/2021 No Yes L89.893 Pressure ulcer of other site, stage 3 06/03/2021 No Yes L89.896 Pressure-induced deep tissue damage of other site 06/03/2021 No Yes L89.153 Pressure ulcer of sacral region, stage 3 08/27/2021 No Yes I10 Essential (primary) hypertension 06/03/2021 No Yes E03.8 Other specified hypothyroidism  06/03/2021 No Yes E66.09 Other obesity due to excess calories 06/03/2021 No Yes Inactive Problems Resolved Problems Electronic Signature(s) Signed: 10/24/2022 10:45:43 AM By: Allen Derry PA-C Entered By: Allen Derry on 10/24/2022 10:45:43 -------------------------------------------------------------------------------- Progress Note Details Patient Name: Date of Service: [name]. 10/24/2022 10:45 A M Medical Record Number: [id number] Patient Account Number: [id number] Date of Birth/Sex: Treating RN: 06/09/1956 (67 y.o. 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 New sacral wound as of 10/29/21 History of Present Illness (HPI) 67yo w/ h/o BLE phlebolymphedema, type 2 DM (unknown hemoglobin A1c), and obesity. No PVD. h/o chronic, recurrent BLE calf ulcers. Treated with 4 layer compression. Infrequently uses lymphedema pump. Bilateral lower extremity ulcerations healed as of  June 2016. Fitted for custom compression stockings but did not receive them. Patient could not travel for lymphedema consult. [name], [name] ([id number]) [id number] Page 6 of 9 She developed recurrent bilateral lower extremity ulcerations in August 2016. She is able to heal these quite quickly with 4 layer elastic compression bandages. However, she is not very compliant using her juxtalite compression garments and tends to develop recurrent ulcerations while using juxtalite. Unna boots were applied this past week. She tolerated this well but performs minimal ambulation. She is scheduled to undergo total hip replacement tomorrow with Dr. Rosita Kea. She is unsure if he is aware of her ulcerations. She is otherwise without complaints. No significant pain. No fever or chills. Moderate clear drainage. Not on any antibiotics. 06/04/2015 -- she did have her hip surgery and has now been in rehabilitation and they're controlling her fluid intake and diuretics to the extent that she has lost 13 pounds of weight and her lower extremity circumference is decreased by 7 cm. 06/18/2015 -- she is still in the rehabilitation facility and continues to be looked after well and has lost a total of 15 pounds and her diabetes is under much better control. Readmission: 06/03/2021 upon evaluation today patient actually appears to be doing somewhat poorly in regard to a wound on the right posterior lower leg. Patient does have a history of previously having been seen here in the clinic but this predates my time here back in 2017. Subsequently unfortunately right now she is having a significant issue with bleeding in the posterior of her right calf this appears to be a deep tissue injury as developed into an ulceration. She does have a history significant for lymphedema, diabetes mellitus type 2, diabetic neuropathy, hypertension, hypothyroidism, and obesity. She is currently in a skilled nursing  facility. The dressing that she had in place during evaluation today was Xeroform followed by significant amount of gauze and this was severely stuck. Upon removal the patient was having significant bleeding she also had significant work done in the hospital in regard to her blood flow and Dr. Wyn Quaker does look like he has her on 2 blood thinners Plavix and Lovenox. This is probably part of the reason why she is bleeding so significantly. With that being said I do believe that based on what we are seeing currently this is probably can to be something that were getting have to try to get stopped with more than just pressure to the region currently. I think surgery foam is probably can to be necessary. If we can get the stop of the Surgifoam she is  probably can have to go to the hospital to try to get this bleeding stopped. 12/30; this is a patient who is currently in a skilled facility. She has wounds on her bilateral calfs likely secondary to severe chronic venous insufficiency and lymphedema the care of these wounds is complicated by the fact that she is on Lovenox and Plavix and bleeds very frequently with even minimal trauma to these wounds. It would appear that she had a Vaseline gauze on the surface of these in the process of undressing them she bled freely from especially the right but also the left. I had to go in and use silver nitrate. The skin on her legs is significant for chronic venous stasis and lymphedema. Very dry flaking skin 07/16/2021; I actually was a patent person who saw this woman 3 weeks ago. She is a nursing facility has chronic venous wounds and bilateral lower extremity wounds. We use Xeroform under compression. The time she was on Lovenox and Plavix I thought I had an excellent reason for these wounds to bleed so freely. Since then the Lovenox has been stopped and should be well out of her system but she is still on Plavix. She still has very friable bleeding wounds on the left  lateral left Posterior and right Posterior lower extremity.These believe very freely when we take off the dressing Also she has a left heel wound which when we saw her last time was a surgical wound however I gather we have been asked to follow this as well. I am not exactly sure at this time what her surgery was but she comes in with a wound VAC on this and they are apparently changing it at the nursing home. 08/27/2021 upon evaluation patient appears to be doing better in regard to her wound on the legs as well as the heel. Fortunately at this point I do not see any need for a wound VAC which is good news overall I think that the Xeroform is probably a good option that is what is being utilized here as well. Fortunately I do not see any signs of active infection locally nor systemically which is great news and overall I think that the patient is making excellent progress. The area on her right gluteal area actually appears to be doing quite well and I am pleased with that. I do believe the silver alginate dressing here could be of benefit for her. 09/17/2021 upon evaluation patient appears to be doing actually better in regard to her wounds on the legs. Fortunately she does not seem to be showing any signs of worsening which is great news and overall very pleased with where we stand. I do believe that she is tolerating the dressing changes without complication which is great news. 10-08-2021 upon evaluation today patient unfortunately continues to have significant issues with her legs. Unfortunately the biggest issue that we see Currently is that she is still having a lot of bleeding from her legs that she is also not able to move well due to her knee pain her left knee is quite significant as far as the discomfort is concerned and the right knee is getting equally as bad. Fortunately I do not see any evidence of active infection locally or systemically which is great news. Nonetheless I do believe that  her heels are looking much better there is a tiny area open on the left heel the right is doing quite well. In regard to the gluteal/sacral region this appears to be  healed. 10-29-2021 upon evaluation today patient unfortunately is appearing to do much worse compared to normal. She has more breakdown in regard to the left lateral leg and again I think this is pressure related. She also has more breakdown in regard to the gluteal area and she is very concerned about this in particular which is completely understandable. With that being said I do believe that she would benefit from more aggressive and appropriate offloading I discussed with her that she needs to allow the facility to change her position every 2 hours and she is can have to find different positions other than just lying on her back. Also discussed with her that obviously the facility needs to make sure to help her with repositioning every 2 hours I think both aspects of this are important. Otherwise her wound on the sacral area is going to continue to get worse and the leg will continue to get worse. 11-26-2021 upon evaluation today patient appears to be doing well with regard to her wounds all things considered. The gluteal areas are all healed which is great news. With that being said she is still having some issues here with her lower extremities. I do think that this is still something that we need to be very mindful of. Unfortunately the facility is still not washing her legs she has a lot of caked on dry skin with Xeroform we will get a try to get some of that off today. With that being said he does have reiterated in the past the patient needs to have her legs washed regularly. 12-24-2021 upon evaluation patient's wounds are showing signs of improvement albeit slowly. Fortunately I do not see any signs of infection locally or systemically which is great news. Nonetheless I do believe that the patient is doing quite well from the  standpoint of the overall appearance of her wounds which are better than where they have been even if not where we would like them to be. 02-03-2022 upon evaluation today patient appears to be doing about the same in regard to her wounds. There is maybe a little bit of improvement in general based on what I am seeing currently but not much to be honest. I do think that the patient is making some progress here when it comes to the overall appearance of her wounds. 04-25-2022 upon evaluation patient actually is here for the first time in about 2-1/2 months. She has had multiple reasons why she did not make it part of the time she was sick and some of the times it was more of a transportation thing. Nonetheless I am glad that she is finally here good news is some of the wounds do appear to be a little bit drier and she has been using a alginate dressing at the facility which they switched her to honestly I cannot argue with the fact that it seems to be doing somewhat better in my opinion. With that being said I do believe one of the biggest issues that I see is simply that she is still having some issues here with pressure although I do not see as much of the dark discoloration as we previously noted she has been using the Prevalon offloading boots which is excellent. I want her to keep using that as I think that still good to be ideal and optimal for her. She does inquire about being able to put pressure on her heels at this point. 06-02-2022 upon evaluation today patient appears to be unfortunately  still having a lot of issues with her legs. Were not seeing a whole lot of improvement she is not as bad as she has been at some point in time but she is also not as good as we would like to see. 07-18-2022 upon evaluation today patient appears to be doing well currently in regard to her wounds on the legs all things considered. This is actually showing signs of improvement compared to where we have been.  Overall I am extremely pleased in that regard. She does have a small area open on her sacral region but this is minimal at this point and actually just more of an irritation at the just the topical barrier cream is ideal here. [name], [name] ([id number]) [id number] Page 7 of 9 08-15-2022 upon evaluation today patient appears to be doing well currently in regard to her wounds all things considered. This is actually one of the better times that I have seen her as far as the overall appearance is concerned I think her heel is doing better she really does not allow me to do much with that as it hurts so badly. Her legs also doing quite well although extremely hyper granulated with the Xeroform gauze. I think it would be worthwhile trying the Sorbact to see if this could be beneficial she does not remember doing this in the past. I think it could prevent it from sticking and then subsequently could also help pull away some of the drainage which will be helpful as well. 09-12-2022 upon evaluation today patient appears to be doing well currently in regard to her legs the left is better than the right on the right I am concerned a little bit about the possibility of infection I discussed that with her today. With that being said I think that we may want to obtain a wound culture and we are going to go ahead and grab that today. 10-24-2022 upon evaluation today patient appears to be doing actually okay in some respects with regard to her legs currently. Fortunately there does not appear to be any signs of active infection locally nor systemically which is great news. No fevers, chills, nausea, vomiting, or diarrhea. Objective Constitutional Obese and well-hydrated in no acute distress. Vitals Time Taken: 11:03 AM, Weight: 245.4 lbs, Temperature: 98.3 F, Pulse: 64 bpm, Respiratory Rate: 16 breaths/min, Blood Pressure: 123/53 mmHg. Respiratory normal breathing without  difficulty. Psychiatric this patient is able to make decisions and demonstrates good insight into disease process. Alert and Oriented x 3. pleasant and cooperative. General Notes: Upon inspection patient's wound bed actually showed signs of good granulation epithelization at this point. Fortunately I do not see any signs of active infection locally nor systemically which is great news. The patient does have areas of hypergranulation but I think a big portion of her problem is that at the facility her legs are not getting cleaned well enough. I made a note of that today and we did clean her legs pretty well here in the office today. We got the majority of the dead skin off which her legs look tremendously better following and I think that is what needs to be done on a regular basis at the facility as well. I discussed this with the patient and we sent a note back as well. Integumentary (Hair, Skin) Wound #60 status is Open. Original cause of wound was Pressure Injury. The date acquired was: 05/17/2021. The wound has been in treatment 72 weeks. The wound is located on  the Right,Circumferential Lower Leg. The wound measures 21.3cm length x 18.4cm width x 0.1cm depth; 307.813cm^2 area and 30.781cm^3 volume. There is Fat Layer (Subcutaneous Tissue) exposed. There is a large amount of sanguinous drainage noted. There is medium (34-66%) red, pink granulation within the wound bed. There is a small (1-33%) amount of necrotic tissue within the wound bed including Adherent Slough. Wound #61 status is Open. Original cause of wound was Gradually Appeared. The date acquired was: 06/11/2021. The wound has been in treatment 69 weeks. The wound is located on the Left,Posterior Lower Leg. The wound measures 9.9cm length x 5.4cm width x 0.1cm depth; 41.987cm^2 area and 4.199cm^3 volume. There is Fat Layer (Subcutaneous Tissue) exposed. There is a large amount of serosanguineous drainage noted. There is medium (34-66%)  red granulation within the wound bed. There is a small (1-33%) amount of necrotic tissue within the wound bed including Adherent Slough. Wound #62 status is Open. Original cause of wound was Gradually Appeared. The date acquired was: 06/11/2021. The wound has been in treatment 69 weeks. The wound is located on the Left,Lateral Lower Leg. The wound measures 21.5cm length x 9cm width x 0.1cm depth; 151.975cm^2 area and 15.197cm^3 volume. There is Fat Layer (Subcutaneous Tissue) exposed. There is a large amount of sanguinous drainage noted. There is large (67-100%) red, pale granulation within the wound bed. There is a small (1-33%) amount of necrotic tissue within the wound bed including Adherent Slough. Assessment Active Problems ICD-10 Lymphedema, not elsewhere classified Type 2 diabetes mellitus with other skin ulcer Type 2 diabetes mellitus with diabetic neuropathy, unspecified Pressure ulcer of other site, stage 3 Pressure-induced deep tissue damage of other site Pressure ulcer of sacral region, stage 3 Essential (primary) hypertension Other specified hypothyroidism Other obesity due to excess calories Plan Follow-up Appointments: Wound #60 Right,Circumferential Lower Leg: Return Appointment in 1 month Bathing/ Shower/ Hygiene: Wash wounds with antibacterial soap and water. - WASH LEGS WITH SOAP and WATER before applying dressings. [name], [name] ([id number]) [id number] Page 8 of 9 No tub bath. Edema Control - Lymphedema / Segmental Compressive Device / Other: Other: - Lymphedema-use ace wraps to help control swelling. Off-Loading: Turn and reposition every 2 hours - PLEASE MAKE SURE THIS IS COMPLETED EVERY TWO HOURS THANK YOU Other: - Put pillows under knees to keep pressure off of calves. Deep tissue injury on posterior calves. Turn on side every 1 hous to keep pressure off calves wear offloading boots Additional Orders / Instructions: Follow  Nutritious Diet and Increase Protein Intake WOUND #60: - Lower Leg Wound Laterality: Right, Circumferential Cleanser: Normal Saline 3 x Per Week/30 Days Discharge Instructions: Wash your hands with soap and water. Remove old dressing, discard into plastic bag and place into trash. Cleanse the wound with Normal Saline prior to applying a clean dressing using gauze sponges, not tissues or cotton balls. Do not scrub or use excessive force. Pat dry using gauze sponges, not tissue or cotton balls. Peri-Wound Care: AandD Ointment 3 x Per Week/30 Days Discharge Instructions: Apply AandD Ointment to entire lower legs including wounds Prim Dressing: Cutimed Sorbact 1.5x 2.38 (in/in) 3 x Per Week/30 Days ary Discharge Instructions: A bacteria- and fungi binding wound dressing, suitable for cavities and fistulas. It is suitable as a wound filler and allows the passage of wound exudate into a secondary dressing. The dressing helps reducing odor and pain and can improve healing. Secondary Dressing: ABD Pad 5x9 (in/in) 3 x Per Week/30 Days Discharge Instructions: 2. Cover with ABD  pad Secured With: ACE WRAP - 87M ACE Elastic Bandage With VELCRO Brand Closure, 4 (in) 3 x Per Week/30 Days Discharge Instructions: 4. Ace wrap to secure dressing in place and control swelling. Secured With: [employer] or Non-Sterile 6-ply 4.5x4 (yd/yd) 3 x Per Week/30 Days Discharge Instructions: 3. Apply Kerlix as directed WOUND #61: - Lower Leg Wound Laterality: Left, Posterior Cleanser: Normal Saline 3 x Per Week/30 Days Discharge Instructions: Wash your hands with soap and water. Remove old dressing, discard into plastic bag and place into trash. Cleanse the wound with Normal Saline prior to applying a clean dressing using gauze sponges, not tissues or cotton balls. Do not scrub or use excessive force. Pat dry using gauze sponges, not tissue or cotton balls. Peri-Wound Care: AandD Ointment 3 x Per Week/30  Days Discharge Instructions: Apply AandD Ointment to entire lower legs including wounds Prim Dressing: Cutimed Sorbact 1.5x 2.38 (in/in) 3 x Per Week/30 Days ary Discharge Instructions: A bacteria- and fungi binding wound dressing, suitable for cavities and fistulas. It is suitable as a wound filler and allows the passage of wound exudate into a secondary dressing. The dressing helps reducing odor and pain and can improve healing. Secondary Dressing: ABD Pad 5x9 (in/in) 3 x Per Week/30 Days Discharge Instructions: 2. Cover with ABD pad Secured With: ACE WRAP - 87M ACE Elastic Bandage With VELCRO Brand Closure, 4 (in) 3 x Per Week/30 Days Discharge Instructions: 4. Ace wrap to secure dressing in place and control swelling. Secured With: [employer] or Non-Sterile 6-ply 4.5x4 (yd/yd) 3 x Per Week/30 Days Discharge Instructions: 3. Apply Kerlix as directed WOUND #62: - Lower Leg Wound Laterality: Left, Lateral Cleanser: Normal Saline 3 x Per Week/30 Days Discharge Instructions: Wash your hands with soap and water. Remove old dressing, discard into plastic bag and place into trash. Cleanse the wound with Normal Saline prior to applying a clean dressing using gauze sponges, not tissues or cotton balls. Do not scrub or use excessive force. Pat dry using gauze sponges, not tissue or cotton balls. Peri-Wound Care: AandD Ointment 3 x Per Week/30 Days Discharge Instructions: Apply AandD Ointment to entire lower legs including wounds Prim Dressing: Cutimed Sorbact 1.5x 2.38 (in/in) 3 x Per Week/30 Days ary Discharge Instructions: A bacteria- and fungi binding wound dressing, suitable for cavities and fistulas. It is suitable as a wound filler and allows the passage of wound exudate into a secondary dressing. The dressing helps reducing odor and pain and can improve healing. Secondary Dressing: ABD Pad 5x9 (in/in) 3 x Per Week/30 Days Discharge Instructions: 2. Cover with ABD pad Secured With:  ACE WRAP - 87M ACE Elastic Bandage With VELCRO Brand Closure, 4 (in) 3 x Per Week/30 Days Discharge Instructions: 4. Ace wrap to secure dressing in place and control swelling. Secured With: [employer] or Non-Sterile 6-ply 4.5x4 (yd/yd) 3 x Per Week/30 Days Discharge Instructions: 3. Apply Kerlix as directed 1. I do believe that the patient needs to have her legs appropriately cleaned on a more regular basis at the facility this is good to be of utmost importance I sent a message back in that regard. 2. Also can recommend that the patient should continue to utilize the ABD pads after applying AandD ointment and Sorbact along with an Ace wrap. 3. I am also going to suggest that we continue to monitor for any signs of infection or worsening. However I think the primary thing is she needs to have her legs washed  with soap and water with every dressing change. We will see patient back for reevaluation in 1 week here in the clinic. If anything worsens or changes patient will contact our office for additional recommendations. Electronic Signature(s) Signed: 10/24/2022 6:28:23 PM By: Allen Derry PA-C Entered By: Allen Derry on 10/24/2022 18:28:23 -------------------------------------------------------------------------------- SuperBill Details Patient Name: Date of Service: [name] 10/24/2022 Medical Record Number: [id number] Patient Account Number: [id number] Date of Birth/Sex: Treating RN: 12-31-1955 (67 y.o. [name] Primary Care Provider: Elizabeth Sauer Other Clinician: Betha Loa Referring Provider: Treating Provider/Extender: Althia Forts Weeks in Treatment: [address] [name] ([id number]) [id number] Page 9 of 9 Diagnosis Coding ICD-10 Codes Code Description I89.0 Lymphedema, not elsewhere classified E11.622 Type 2 diabetes mellitus with other skin ulcer E11.40 Type 2 diabetes mellitus with diabetic neuropathy,  unspecified L89.893 Pressure ulcer of other site, stage 3 L89.896 Pressure-induced deep tissue damage of other site L89.153 Pressure ulcer of sacral region, stage 3 I10 Essential (primary) hypertension E03.8 Other specified hypothyroidism E66.09 Other obesity due to excess calories Facility Procedures : CPT4 Code: [id number] Description: 99214 - WOUND CARE VISIT-LEV 4 EST PT Modifier: Quantity: 1 Physician Procedures : CPT4 Code Description Modifier [id number] - WC PHYS LEVEL 3 - EST PT ICD-10 Diagnosis Description I89.0 Lymphedema, not elsewhere classified E11.622 Type 2 diabetes mellitus with other skin ulcer E11.40 Type 2 diabetes mellitus with diabetic  neuropathy, unspecified L89.893 Pressure ulcer of other site, stage 3 Quantity: 1 Electronic Signature(s) Signed: 10/24/2022 6:30:19 PM By: Allen Derry PA-C Entered By: Allen Derry on 10/24/2022 18:30:19

## 2022-10-24 NOTE — Progress Notes (Signed)
[name], [name] ([id number]) [phone].pdf Page 1 of 5 Visit Report for 10/24/2022 Arrival Information Details Patient Name: Date of Service: [name], [name] 10/24/2022 10:45 A M Medical Record Number: [id number] Patient Account Number: [id number] Date of Birth/Sex: Treating RN: 1955/07/23 (67 y.o. [name] Primary Care [name]: [name] Other Clinician: Betha [name] Referring [name]: Treating [name]/Extender: [name] Weeks in Treatment: 72 Visit Information History Since Last Visit All ordered tests and consults were completed: No Patient Arrived: Stretcher Added or deleted any medications: No Arrival Time: 11:00 Any new allergies or adverse reactions: No Transfer Assistance: Stretcher Had a fall or experienced change in No Patient Identification Verified: Yes activities of daily living that may affect Secondary Verification Process Completed: Yes risk of falls: Patient Requires Transmission-Based Precautions: No Signs or symptoms of abuse/neglect since last visito No Patient Has Alerts: Yes Hospitalized since last visit: No Patient Alerts: Patient on Blood Thinner Implantable device outside of the clinic excluding No TYPE II Diabetic cellular tissue based products placed in the center Plavix and Lovenox since last visit: [facility] Has Dressing in Place as Prescribed: Yes Has Footwear/Offloading in Place as Prescribed: Yes Left: Other:heel pads Right: Other:heel pads Pain Present Now: Yes Electronic Signature(s) Unsigned Entered ByBetha [name] on 10/24/2022 11:01:37 -------------------------------------------------------------------------------- Lower Extremity Assessment Details Patient Name: Date of Service: [name], [name] 10/24/2022 10:45 A M Medical Record Number: [id number] Patient Account Number: [id number] Date of Birth/Sex: Treating RN: 08/17/1955 (67 y.o. [name] Primary Care [name]: [name] Other Clinician: Betha [name] Referring [name]: Treating [name]/Extender: [name] Weeks in Treatment: 72 Electronic Signature(s) Unsigned Entered By: [name] on 10/24/2022 11:29:27 -------------------------------------------------------------------------------- Multi Wound Chart Details Patient Name: Date of Service: [name], [name] 10/24/2022 10:45 A M Medical Record Number: [id number] Patient Account Number: [id number] Date of Birth/Sex: Treating RN: 03-Mar-1956 (67 y.o. [name] Primary Care [name]: [name] Other Clinician: Betha [name] Referring [name]: Treating [name]/Extender: [name] Weeks in Treatment: 72 Vital Signs Height(in): Pulse(bpm): 64 Weight(lbs): 245.4 Blood Pressure(mmHg): 123/53 Body Mass Index(BMI): Temperature(F): 98.3 [name] ([id number]) [phone].pdf Page 2 of 5 Respiratory Rate(breaths/min): 16 [60:Photos: No Photos Right, Circumferential Lower Leg Wound Location: Pressure Injury Wounding Event: Pressure Ulcer Primary Etiology: Lymphedema, Hypertension, Comorbid History: Peripheral Arterial Disease, Peripheral Venous Disease, Type II Diabetes, End Stage  Renal Disease, Osteoarthritis, Neuropathy 05/17/2021 Date Acquired: 72 Weeks of Treatment: Open Wound Status: No Wound Recurrence: 21.3x18.4x0.1 Measurements L x W x D (cm) 307.813 A (cm) : rea 30.781 Volume (cm) : -54380.20% % Reduction in Area:  -53901.80% % Reduction in Volume: Category/Stage III Classification: Large Exudate A mount: Sanguinous Exudate Type: red Exudate Color: Medium (34-66%) Granulation A mount: Red, Pink Granulation Quality: Small (1-33%) Necrotic A mount: Fat Layer  (Subcutaneous Tissue): Yes Fat Layer (Subcutaneous Tissue): Yes Fat Layer (Subcutaneous Tissue): Yes Exposed Structures: Small (1-33%) Epithelialization:] [61:No  Photos Left, Posterior Lower Leg Gradually Appeared Lymphedema Lymphedema, Hypertension,  Venous Disease, Type II Diabetes, End Stage Renal Disease, Osteoarthritis, Neuropathy 06/11/2021 67 Open No 9.9x5.4x0.1 41.987 4.199 -32.00% -32.00% Full Thickness With Exposed Support Full Thickness Without Exposed Structures Large Serosanguineous red,  brown Medium (34-66%) Red Small (1-33%) None] [62:No Photos Left, Lateral Lower Leg Gradually Appeared Lymphedema Lymphedema, Hypertension, Peripheral Arterial Disease, Peripheral Venous Disease, Type II Diabetes, End Stage Renal Disease, Osteoarthritis,  Neuropathy 06/11/2021 67 Open No 21.5x9x0.1 151.975 15.197 -16154.00% -16240.90% Support Structures Large Sanguinous red Large (  67-100%) Red, Pale Small (1-33%) None] Treatment Notes Electronic Signature(s) Unsigned Entered By: [name] on 10/24/2022 11:29:33 -------------------------------------------------------------------------------- Pain Assessment Details Patient Name: Date of Service: [name], [name] 10/24/2022 10:45 A M Medical Record Number: [id number] Patient Account Number: [id number] Date of Birth/Sex: Treating RN: [birth year] (67 y.o. [name] Primary Care [name]: [name] Other Clinician: Betha [name] Referring Aamari West: Treating [name]/Extender: [name] Weeks in Treatment: 72 Active Problems Location of Pain Severity and Description of Pain Patient Has Paino Yes Site Locations Pain Location: Generalized Pain, Pain in Ulcers Duration of the Pain. Constant / Intermittento Constant Rate the pain. Current Pain Level: 4 [name] [phone] [id number].pdf Page 3 of 5 Character of Pain Describe the Pain: Aching Pain Management and Medication Current Pain Management: Medication: Yes Cold Application: No Rest: No Massage: No Activity: No T.E.N.S.: No Heat Application: No Leg drop or elevation: No Is  the Current Pain Management Adequate: Inadequate How does your wound impact your activities of daily livingo Sleep: No Bathing: No Appetite: No Relationship With Others: No Bladder Continence: No Emotions: No Bowel Continence: No Work: No Toileting: No Drive: No Dressing: No Hobbies: No Electronic Signature(s) Unsigned Entered ByBetha [name] on 10/24/2022 11:27:19 -------------------------------------------------------------------------------- Wound Assessment Details Patient Name: Date of Service: [name], [name] 10/24/2022 10:45 A M Medical Record Number: [id number] Patient Account Number: [id number] Date of Birth/Sex: Treating RN: 1955/08/13 (67 y.o. [name] Primary Care Chibueze Beasley: [name] Other Clinician: Betha [name] Referring Ellan Tess: Treating Lucciano Vitali/Extender: [name] Weeks in Treatment: 72 Wound Status Wound Number: 60 Primary Pressure Ulcer Etiology: Wound Location: Right, Circumferential Lower Leg Wound Open Wounding Event: Pressure Injury Status: Date Acquired: 05/17/2021 Comorbid Lymphedema, Hypertension, Peripheral Arterial Disease, Peripheral Weeks Of Treatment: 72 History: Venous Disease, Type II Diabetes, End Stage Renal Disease, Clustered Wound: No Osteoarthritis, Neuropathy Wound Measurements Length: (cm) 21.3 Width: (cm) 18.4 Depth: (cm) 0.1 Area: (cm) 307.813 Volume: (cm) 30.781 % Reduction in Area: -54380.2% % Reduction in Volume: -53901.8% Epithelialization: Small (1-33%) Wound Description Classification: Category/Stage III Exudate Amount: Large Exudate Type: Sanguinous Exudate Color: red Foul Odor After Cleansing: No Slough/Fibrino Yes Wound Bed Granulation Amount: Medium (34-66%) Exposed Structure Granulation Quality: Red, Pink Fat Layer (Subcutaneous Tissue) Exposed: Yes Necrotic Amount: Small (1-33%) Necrotic Quality: Adherent Scientist, physiological) Unsigned Entered By:  [name] on 10/24/2022 11:28:12 Signature(s): Corwin Levins ([id number]) [phone] Date(s): [id number] Page 4 of 5 -------------------------------------------------------------------------------- Wound Assessment Details Patient Name: Date of Service: [name], [name] 10/24/2022 10:45 A M Medical Record Number: [id number] Patient Account Number: [id number] Date of Birth/Sex: Treating RN: March 01, 1956 (66 y.o. [name] Primary Care Favio Moder: [name] Other Clinician: Betha [name] Referring Maeci Kalbfleisch: Treating Alexza Norbeck/Extender: [name] Weeks in Treatment: 72 Wound Status Wound Number: 61 Primary Lymphedema Etiology: Wound Location: Left, Posterior Lower Leg Wound Open Wounding Event: Gradually Appeared Status: Date Acquired: 06/11/2021 Comorbid Lymphedema, Hypertension, Peripheral Arterial Disease, Peripheral Weeks Of Treatment: 69 History: Venous Disease, Type II Diabetes, End Stage Renal Disease, Clustered Wound: No Osteoarthritis, Neuropathy Wound Measurements Length: (cm) 9.9 Width: (cm) 5.4 Depth: (cm) 0.1 Area: (cm) 41.987 Volume: (cm) 4.199 % Reduction in Area: -32% % Reduction in Volume: -32% Epithelialization: None Wound Description Classification: Full Thickness With Exposed Suppo Exudate Amount: Large Exudate Type: Serosanguineous Exudate Color: red, brown rt Structures Foul Odor After Cleansing: No Slough/Fibrino Yes Wound Bed Granulation Amount: Medium (34-66%) Exposed Structure Granulation Quality: Red Fat Layer (Subcutaneous Tissue) Exposed:  Yes Necrotic Amount: Small (1-33%) Necrotic Quality: Adherent Scientist, physiological) Unsigned Entered By: [name] on 10/24/2022 11:28:27 -------------------------------------------------------------------------------- Wound Assessment Details Patient Name: Date of Service: [name], [name] 10/24/2022 10:45 A M Medical Record  Number: [id number] Patient Account Number: [id number] Date of Birth/Sex: Treating RN: 11-Apr-1956 (67 y.o. [name] Primary Care Antoneo Ghrist: [name] Other Clinician: Betha [name] Referring Nazli Penn: Treating Asia Favata/Extender: [name] Weeks in Treatment: 72 Wound Status Wound Number: 62 Primary Lymphedema Etiology: Wound Location: Left, Lateral Lower Leg Wound Open Wounding Event: Gradually Appeared Status: Date Acquired: 06/11/2021 Comorbid Lymphedema, Hypertension, Peripheral Arterial Disease, Peripheral Weeks Of Treatment: 69 History: Venous Disease, Type II Diabetes, End Stage Renal Disease, Clustered Wound: No Osteoarthritis, Neuropathy Wound Measurements Length: (cm) 21.5 Width: (cm) 9 Depth: (cm) 0.1 Area: (cm) 151.975 Volume: (cm) 15.197 % Reduction in Area: -16154% % Reduction in Volume: -16240.9% Epithelialization: None Wound Description Latimore, Jehan [name] ([id number]) Classification: Full Thickness Without Exposed Support Structures Exudate Amount: Large Exudate Type: Sanguinous Exudate Color: red [id number].pdf Page 5 of 5 Foul Odor After Cleansing: No Slough/Fibrino Yes Wound Bed Granulation Amount: Large (67-100%) Exposed Structure Granulation Quality: Red, Pale Fat Layer (Subcutaneous Tissue) Exposed: Yes Necrotic Amount: Small (1-33%) Necrotic Quality: Adherent Scientist, physiological) Unsigned Entered By: [name] on 10/24/2022 11:28:36 -------------------------------------------------------------------------------- Vitals Details Patient Name: Date of Service: [name], [name] 10/24/2022 10:45 A M Medical Record Number: [id number] Patient Account Number: [id number] Date of Birth/Sex: Treating RN: February 07, 1956 (66 y.o. Philbert Riser, Luther Parody Primary Care Akasia Ahmad: [name] Other Clinician: Betha [name] Referring Letcher Schweikert: Treating Amando Ishikawa/Extender: [name] Weeks in Treatment: 72 Vital Signs Time Taken: 11:03 Temperature (F): 98.3 Weight (lbs): 245.4 Pulse (bpm): 64 Respiratory Rate (breaths/min): 16 Blood Pressure (mmHg): 123/53 Reference Range: 80 - 120 mg / dl Electronic Signature(s) Unsigned Entered ByBetha [name] on 10/24/2022 11:05:10 Signature(s): Date(s):

## 2022-10-25 DIAGNOSIS — M6281 Muscle weakness (generalized): Secondary | ICD-10-CM | POA: Diagnosis not present

## 2022-10-26 DIAGNOSIS — M6281 Muscle weakness (generalized): Secondary | ICD-10-CM | POA: Diagnosis not present

## 2022-10-28 DIAGNOSIS — M6281 Muscle weakness (generalized): Secondary | ICD-10-CM | POA: Diagnosis not present

## 2022-10-29 DIAGNOSIS — M6281 Muscle weakness (generalized): Secondary | ICD-10-CM | POA: Diagnosis not present

## 2022-10-31 DIAGNOSIS — M6281 Muscle weakness (generalized): Secondary | ICD-10-CM | POA: Diagnosis not present

## 2022-11-01 DIAGNOSIS — M6281 Muscle weakness (generalized): Secondary | ICD-10-CM | POA: Diagnosis not present

## 2022-11-01 DIAGNOSIS — L89893 Pressure ulcer of other site, stage 3: Secondary | ICD-10-CM | POA: Diagnosis not present

## 2022-11-02 DIAGNOSIS — M6281 Muscle weakness (generalized): Secondary | ICD-10-CM | POA: Diagnosis not present

## 2022-11-21 DIAGNOSIS — E1169 Type 2 diabetes mellitus with other specified complication: Secondary | ICD-10-CM | POA: Diagnosis not present

## 2022-11-22 ENCOUNTER — Encounter: Payer: Medicare HMO | Attending: Physician Assistant | Admitting: Physician Assistant

## 2022-11-22 DIAGNOSIS — E11622 Type 2 diabetes mellitus with other skin ulcer: Secondary | ICD-10-CM | POA: Diagnosis not present

## 2022-11-22 DIAGNOSIS — E6609 Other obesity due to excess calories: Secondary | ICD-10-CM | POA: Insufficient documentation

## 2022-11-22 DIAGNOSIS — I10 Essential (primary) hypertension: Secondary | ICD-10-CM | POA: Diagnosis not present

## 2022-11-22 DIAGNOSIS — L97222 Non-pressure chronic ulcer of left calf with fat layer exposed: Secondary | ICD-10-CM | POA: Diagnosis not present

## 2022-11-22 DIAGNOSIS — L89153 Pressure ulcer of sacral region, stage 3: Secondary | ICD-10-CM | POA: Insufficient documentation

## 2022-11-22 DIAGNOSIS — Z7902 Long term (current) use of antithrombotics/antiplatelets: Secondary | ICD-10-CM | POA: Insufficient documentation

## 2022-11-22 DIAGNOSIS — Z7901 Long term (current) use of anticoagulants: Secondary | ICD-10-CM | POA: Diagnosis not present

## 2022-11-22 DIAGNOSIS — L89896 Pressure-induced deep tissue damage of other site: Secondary | ICD-10-CM | POA: Diagnosis not present

## 2022-11-22 DIAGNOSIS — E038 Other specified hypothyroidism: Secondary | ICD-10-CM | POA: Diagnosis not present

## 2022-11-22 DIAGNOSIS — Z743 Need for continuous supervision: Secondary | ICD-10-CM | POA: Diagnosis not present

## 2022-11-22 DIAGNOSIS — I890 Lymphedema, not elsewhere classified: Secondary | ICD-10-CM | POA: Diagnosis not present

## 2022-11-22 DIAGNOSIS — I959 Hypotension, unspecified: Secondary | ICD-10-CM | POA: Diagnosis not present

## 2022-11-22 DIAGNOSIS — L97828 Non-pressure chronic ulcer of other part of left lower leg with other specified severity: Secondary | ICD-10-CM | POA: Diagnosis not present

## 2022-11-22 DIAGNOSIS — E1140 Type 2 diabetes mellitus with diabetic neuropathy, unspecified: Secondary | ICD-10-CM | POA: Insufficient documentation

## 2022-11-22 DIAGNOSIS — L89893 Pressure ulcer of other site, stage 3: Secondary | ICD-10-CM | POA: Insufficient documentation

## 2022-11-22 DIAGNOSIS — Z789 Other specified health status: Secondary | ICD-10-CM | POA: Diagnosis not present

## 2022-11-22 DIAGNOSIS — R531 Weakness: Secondary | ICD-10-CM | POA: Diagnosis not present

## 2022-11-22 NOTE — Progress Notes (Addendum)
Autumn, [name] ([id number]) [id number] Page 1 of 9 Visit Report for 11/22/2022 Chief Complaint Document Details Patient Name: Date of Service: [name], [name] 11/22/2022 10:45 A M Medical Record Number: [id number] Patient Account Number: [id number] Date of Birth/Sex: Treating RN: 1955-09-03 (67 y.o. [name] Primary Care Provider: Elizabeth [name] Other Clinician: Betha [name] Referring Provider: Treating Provider/Extender: [name]: 43 Information Obtained from: Patient Chief Complaint Bilateral lower extremity phlebolymphedema and recurrent calf ulcerations. New sacral wound as of 10/29/21 Electronic Signature(s) Signed: 11/22/2022 11:09:34 AM By: Autumn Derry PA-C Entered By: [name] on 11/22/2022 11:09:33 -------------------------------------------------------------------------------- HPI Details Patient Name: Date of Service: [name]. 11/22/2022 10:45 A M Medical Record Number: [id number] Patient Account Number: [id number] Date of Birth/Sex: Treating RN: [birth year] (67 y.o. [name] Primary Care Provider: Elizabeth [name] Other Clinician: Betha [name] Referring Provider: Treating Provider/Extender: [name]: 53 History of Present Illness HPI Description: 68yo w/ h/o BLE phlebolymphedema, type 2 DM (unknown hemoglobin A1c), and obesity. No PVD. h/o chronic, recurrent BLE calf ulcers. Treated with 4 layer compression. Infrequently uses lymphedema pump. Bilateral lower extremity ulcerations healed as of June 2016. Fitted for custom compression stockings but did not receive them. Patient could not travel for lymphedema consult. She developed recurrent bilateral lower extremity ulcerations in August 2016. She is able to heal these quite quickly with 4 layer elastic compression bandages. However, she is not very compliant using her juxtalite compression  garments and tends to develop recurrent ulcerations while using juxtalite. Unna boots were applied this past week. She tolerated this well but performs minimal ambulation. She is scheduled to undergo total hip replacement tomorrow with [name]. She is unsure if he is aware of her ulcerations. She is otherwise without complaints. No significant pain. No fever or chills. Moderate clear drainage. Not on any antibiotics. 06/04/2015 -- she did have her hip surgery and has now been in rehabilitation and they're controlling her fluid intake and diuretics to the extent that she has lost 13 pounds of weight and her lower extremity circumference is decreased by 7 cm. 06/18/2015 -- she is still in the rehabilitation facility and continues to be looked after well and has lost a total of 15 pounds and her diabetes is under much better control. [name], [name] ([id number]) [id number] Page 2 of 9 Readmission: 06/03/2021 upon evaluation today patient actually appears to be doing somewhat poorly in regard to a wound on the right posterior lower leg. Patient does have a history of previously having been seen here in the clinic but this predates my time here back in 2017. Subsequently unfortunately right now she is having a significant issue with bleeding in the posterior of her right calf this appears to be a deep tissue injury as developed into an ulceration. She does have a history significant for lymphedema, diabetes mellitus type 2, diabetic neuropathy, hypertension, hypothyroidism, and obesity. She is currently in a skilled nursing facility. The dressing that she had in place during evaluation today was Xeroform followed by significant amount of gauze and this was severely stuck. Upon removal the patient was having significant bleeding she also had significant work done in the hospital in regard to her blood flow and [name] does look like he has her on 2 blood thinners Plavix and  Lovenox. This is probably part of the reason why she is bleeding so significantly. With that being said I do believe that based  on what we are seeing currently this is probably can to be something that were getting have to try to get stopped with more than just pressure to the region currently. I think surgery foam is probably can to be necessary. If we can get the stop of the Surgifoam she is probably can have to go to the hospital to try to get this bleeding stopped. 12/30; this is a patient who is currently in a skilled facility. She has wounds on her bilateral calfs likely secondary to severe chronic venous insufficiency and lymphedema the care of these wounds is complicated by the fact that she is on Lovenox and Plavix and bleeds very frequently with even minimal trauma to these wounds. It would appear that she had a Vaseline gauze on the surface of these in the process of undressing them she bled freely from especially the right but also the left. I had to go in and use silver nitrate. The skin on her legs is significant for chronic venous stasis and lymphedema. Very dry flaking skin 07/16/2021; I actually was a patent person who saw this woman 3 weeks ago. She is a nursing facility has chronic venous wounds and bilateral lower extremity wounds. We use Xeroform under compression. The time she was on Lovenox and Plavix I thought I had an excellent reason for these wounds to bleed so freely. Since then the Lovenox has been stopped and should be well out of her system but she is still on Plavix. She still has very friable bleeding wounds on the left lateral left Posterior and right Posterior lower extremity.These believe very freely when we take off the dressing Also she has a left heel wound which when we saw her last time was a surgical wound however I gather we have been asked to follow this as well. I am not exactly sure at this time what her surgery was but she comes in with a wound VAC on this  and they are apparently changing it at the nursing home. 08/27/2021 upon evaluation patient appears to be doing better in regard to her wound on the legs as well as the heel. Fortunately at this point I do not see any need for a wound VAC which is good news overall I think that the Xeroform is probably a good option that is what is being utilized here as well. Fortunately I do not see any signs of active infection locally nor systemically which is great news and overall I think that the patient is making excellent progress. The area on her right gluteal area actually appears to be doing quite well and I am pleased with that. I do believe the silver alginate dressing here could be of benefit for her. 09/17/2021 upon evaluation patient appears to be doing actually better in regard to her wounds on the legs. Fortunately she does not seem to be showing any signs of worsening which is great news and overall very pleased with where we stand. I do believe that she is tolerating the dressing changes without complication which is great news. 10-08-2021 upon evaluation today patient unfortunately continues to have significant issues with her legs. Unfortunately the biggest issue that we see Currently is that she is still having a lot of bleeding from her legs that she is also not able to move well due to her knee pain her left knee is quite significant as far as the discomfort is concerned and the right knee is getting equally as bad. Fortunately I do  not see any evidence of active infection locally or systemically which is great news. Nonetheless I do believe that her heels are looking much better there is a tiny area open on the left heel the right is doing quite well. In regard to the gluteal/sacral region this appears to be healed. 10-29-2021 upon evaluation today patient unfortunately is appearing to do much worse compared to normal. She has more breakdown in regard to the left lateral leg and again I think this  is pressure related. She also has more breakdown in regard to the gluteal area and she is very concerned about this in particular which is completely understandable. With that being said I do believe that she would benefit from more aggressive and appropriate offloading I discussed with her that she needs to allow the facility to change her position every 2 hours and she is can have to find different positions other than just lying on her back. Also discussed with her that obviously the facility needs to make sure to help her with repositioning every 2 hours I think both aspects of this are important. Otherwise her wound on the sacral area is going to continue to get worse and the leg will continue to get worse. 11-26-2021 upon evaluation today patient appears to be doing well with regard to her wounds all things considered. The gluteal areas are all healed which is great news. With that being said she is still having some issues here with her lower extremities. I do think that this is still something that we need to be very mindful of. Unfortunately the facility is still not washing her legs she has a lot of caked on dry skin with Xeroform we will get a try to get some of that off today. With that being said he does have reiterated in the past the patient needs to have her legs washed regularly. 12-24-2021 upon evaluation patient's wounds are showing signs of improvement albeit slowly. Fortunately I do not see any signs of infection locally or systemically which is great news. Nonetheless I do believe that the patient is doing quite well from the standpoint of the overall appearance of her wounds which are better than where they have been even if not where we would like them to be. 02-03-2022 upon evaluation today patient appears to be doing about the same in regard to her wounds. There is maybe a little bit of improvement in general based on what I am seeing currently but not much to be honest. I do think  that the patient is making some progress here when it comes to the overall appearance of her wounds. 04-25-2022 upon evaluation patient actually is here for the first time in about 2-1/2 months. She has had multiple reasons why she did not make it part of the time she was sick and some of the times it was more of a transportation thing. Nonetheless I am glad that she is finally here good news is some of the wounds do appear to be a little bit drier and she has been using a alginate dressing at the facility which they switched her to honestly I cannot argue with the fact that it seems to be doing somewhat better in my opinion. With that being said I do believe one of the biggest issues that I see is simply that she is still having some issues here with pressure although I do not see as much of the dark discoloration as we previously noted she has been  using the Prevalon offloading boots which is excellent. I want her to keep using that as I think that still good to be ideal and optimal for her. She does inquire about being able to put pressure on her heels at this point. 06-02-2022 upon evaluation today patient appears to be unfortunately still having a lot of issues with her legs. Were not seeing a whole lot of improvement she is not as bad as she has been at some point in time but she is also not as good as we would like to see. 07-18-2022 upon evaluation today patient appears to be doing well currently in regard to her wounds on the legs all things considered. This is actually showing signs of improvement compared to where we have been. Overall I am extremely pleased in that regard. She does have a small area open on her sacral region but this is minimal at this point and actually just more of an irritation at the just the topical barrier cream is ideal here. 08-15-2022 upon evaluation today patient appears to be doing well currently in regard to her wounds all things considered. This is actually one of  the better times that I have seen her as far as the overall appearance is concerned I think her heel is doing better she really does not allow me to do much with that as it hurts so badly. Her legs also doing quite well although extremely hyper granulated with the Xeroform gauze. I think it would be worthwhile trying the Sorbact to see if this could be beneficial she does not remember doing this in the past. I think it could prevent it from sticking and then subsequently could also help pull away some of the drainage which will be helpful as well. 09-12-2022 upon evaluation today patient appears to be doing well currently in regard to her legs the left is better than the right on the right I am concerned a little bit about the possibility of infection I discussed that with her today. With that being said I think that we may want to obtain a wound culture and we are going to go ahead and grab that today. 10-24-2022 upon evaluation today patient appears to be doing actually okay in some respects with regard to her legs currently. Fortunately there does not appear to be any signs of active infection locally nor systemically which is great news. No fevers, chills, nausea, vomiting, or diarrhea. [name], [name] ([id number]) [id number] Page 3 of 9 11-22-2022 upon evaluation today patient's legs actually look a little better in regard to the overall skin appearance compared to previous they have been washing her legs well at the facility which is good news. With that being said she still has some significant issues here with open wounds that again just really not getting a lot better they are very hyper granulated it has been worse but nonetheless it still pretty much circumferential as far as what we are seeing at this point which is not ideal. Again a lot of this especially on the posterior aspect is due to pressure and the fact that she cannot really lay in many different  ways other than what she is doing currently on her back and that because of pressure on the back of the legs. Electronic Signature(s) Signed: 11/22/2022 1:28:54 PM By: Autumn Derry PA-C Entered By: [name] on 11/22/2022 13:28:54 -------------------------------------------------------------------------------- Physical Exam Details Patient Name: Date of Service: [name], [name] 11/22/2022 10:45 A M Medical Record Number:  [id number] Patient Account Number: [id number] Date of Birth/Sex: Treating RN: 05/06/1956 (68 y.o. [name] Primary Care Provider: Elizabeth [name] Other Clinician: Betha [name] Referring Provider: Treating Provider/Extender: [name]: 32 Notes Upon inspection patient's wound bed actually showed signs of good granulation and epithelization at this point. Fortunately I do not see any evidence of infection locally nor systemically which is great news with that being said were not really making progress as much and as quickly as I would like to see. Electronic Signature(s) Signed: 11/22/2022 1:29:27 PM By: Autumn Derry PA-C Entered By: [name] on 11/22/2022 13:29:27 -------------------------------------------------------------------------------- Physician Orders Details Patient Name: Date of Service: [name]. 11/22/2022 10:45 A M Medical Record Number: [id number] Patient Account Number: [id number] Date of Birth/Sex: Treating RN: [birth year] (67 y.o. [name] Primary Care Provider: Elizabeth [name] Other Clinician: Betha [name] Referring Provider: Treating Provider/Extender: [name]: 76 Verbal / Phone Orders: Yes Clinician: Midge Aver Read Back and Verified: Yes Diagnosis Coding ICD-10 Coding Code Description I89.0 Lymphedema, not elsewhere classified E11.622 Type 2 diabetes mellitus with other skin ulcer E11.40 Type 2 diabetes mellitus with diabetic neuropathy,  unspecified L89.893 Pressure ulcer of other site, stage 3 Hanger, Kileigh K ([id number]) [id number] Page 4 of 9 L89.896 Pressure-induced deep tissue damage of other site L89.153 Pressure ulcer of sacral region, stage 3 I10 Essential (primary) hypertension E03.8 Other specified hypothyroidism E66.09 Other obesity due to excess calories Follow-up Appointments Wound #60 Right,Circumferential Lower Leg Return Appointment in 1 month Bathing/ Shower/ Hygiene Wash wounds with antibacterial soap and water. - WASH LEGS WITH SOAP and WATER before applying dressings. No tub bath. Edema Control - Lymphedema / Segmental Compressive Device / Other Other: - Lymphedema-use ace wraps to help control swelling. Off-Loading Turn and reposition every 2 hours - PLEASE MAKE SURE THIS IS COMPLETED EVERY TWO HOURS THANK YOU Other: - Put pillows under knees to keep pressure off of calves. Deep tissue injury on posterior calves. Turn on side every 1 hous to keep pressure off calves wear offloading boots Additional Orders / Instructions Follow Nutritious Diet and Increase Protein Intake Wound [name] Wound #60 - Lower Leg Wound Laterality: Right, Circumferential Cleanser: Normal Saline 3 x Per Week/30 Days Discharge Instructions: Wash your hands with soap and water. Remove old dressing, discard into plastic bag and place into trash. Cleanse the wound with Normal Saline prior to applying a clean dressing using gauze sponges, not tissues or cotton balls. Do not scrub or use excessive force. Pat dry using gauze sponges, not tissue or cotton balls. Peri-Wound Care: AandD Ointment 3 x Per Week/30 Days Discharge Instructions: Apply AandD Ointment to entire lower legs including wounds Prim Dressing: Cutimed Sorbact 1.5x 2.38 (in/in) 3 x Per Week/30 Days ary Discharge Instructions: A bacteria- and fungi binding wound dressing, suitable for cavities and fistulas. It is suitable as a wound  filler and allows the passage of wound exudate into a secondary dressing. The dressing helps reducing odor and pain and can improve healing. Secondary Dressing: ABD Pad 5x9 (in/in) 3 x Per Week/30 Days Discharge Instructions: 2. Cover with ABD pad Secured With: ACE WRAP - 74M ACE Elastic Bandage With VELCRO Brand Closure, 4 (in) 3 x Per Week/30 Days Discharge Instructions: 4. Ace wrap to secure dressing in place and control swelling. Secured With: [employer] or Non-Sterile 6-ply 4.5x4 (yd/yd) 3 x Per Week/30 Days Discharge Instructions: 3. Apply Kerlix as directed Wound #61 -  Lower Leg Wound Laterality: Left, Posterior Cleanser: Normal Saline 3 x Per Week/30 Days Discharge Instructions: Wash your hands with soap and water. Remove old dressing, discard into plastic bag and place into trash. Cleanse the wound with Normal Saline prior to applying a clean dressing using gauze sponges, not tissues or cotton balls. Do not scrub or use excessive force. Pat dry using gauze sponges, not tissue or cotton balls. Peri-Wound Care: AandD Ointment 3 x Per Week/30 Days Discharge Instructions: Apply AandD Ointment to entire lower legs including wounds Prim Dressing: Cutimed Sorbact 1.5x 2.38 (in/in) 3 x Per Week/30 Days ary Discharge Instructions: A bacteria- and fungi binding wound dressing, suitable for cavities and fistulas. It is suitable as a wound filler and allows the passage of wound exudate into a secondary dressing. The dressing helps reducing odor and pain and can improve healing. Secondary Dressing: ABD Pad 5x9 (in/in) 3 x Per Week/30 Days Discharge Instructions: 2. Cover with ABD pad Secured With: ACE WRAP - 21M ACE Elastic Bandage With VELCRO Brand Closure, 4 (in) 3 x Per Week/30 Days Discharge Instructions: 4. Ace wrap to secure dressing in place and control swelling. Secured With: [employer] or Non-Sterile 6-ply 4.5x4 (yd/yd) 3 x Per Week/30 Days Discharge Instructions: 3.  Apply Kerlix as directed Electronic Signature(s) Signed: 11/23/2022 5:03:24 PM By: Autumn Derry PA-C Signed: 11/24/2022 3:18:22 PM By: Alecia Lemming ([id number]) By: Rudene Anda.pdf Page 5 of 9 Signed: 11/24/2022 3:18:22 PM Entered By: [name] on 11/22/2022 11:41:18 -------------------------------------------------------------------------------- Problem List Details Patient Name: Date of Service: [name], [name] 11/22/2022 10:45 A M Medical Record Number: [id number] Patient Account Number: [id number] Date of Birth/Sex: Treating RN: June 21, 1956 (67 y.o. [name] Primary Care Provider: Elizabeth [name] Other Clinician: Betha [name] Referring Provider: Treating Provider/Extender: [name]: 19 Active Problems ICD-10 Encounter Code Description Active Date MDM Diagnosis I89.0 Lymphedema, not elsewhere classified 06/03/2021 No Yes E11.622 Type 2 diabetes mellitus with other skin ulcer 06/03/2021 No Yes E11.40 Type 2 diabetes mellitus with diabetic neuropathy, unspecified 06/03/2021 No Yes L89.893 Pressure ulcer of other site, stage 3 06/03/2021 No Yes L89.896 Pressure-induced deep tissue damage of other site 06/03/2021 No Yes L89.153 Pressure ulcer of sacral region, stage 3 08/27/2021 No Yes I10 Essential (primary) hypertension 06/03/2021 No Yes E03.8 Other specified hypothyroidism 06/03/2021 No Yes E66.09 Other obesity due to excess calories 06/03/2021 No Yes Inactive Problems Resolved Problems Electronic Signature(s) Signed: 11/22/2022 11:09:30 AM By: Autumn Derry PA-C Entered By: [name] on 11/22/2022 11:09:30 Corwin Levins ([id number]) [id number] Page 6 of 9 -------------------------------------------------------------------------------- Progress Note Details Patient Name: Date of Service: [name], [name] 11/22/2022 10:45 A M Medical Record  Number: [id number] Patient Account Number: [id number] Date of Birth/Sex: Treating RN: 09-01-1955 (67 y.o. [name] Primary Care Provider: Elizabeth [name] Other Clinician: Betha [name] Referring Provider: Treating Provider/Extender: [name]: 16 Subjective Chief Complaint Information obtained from Patient Bilateral lower extremity phlebolymphedema and recurrent calf ulcerations. New sacral wound as of 10/29/21 History of Present Illness (HPI) 67yo w/ h/o BLE phlebolymphedema, type 2 DM (unknown hemoglobin A1c), and obesity. No PVD. h/o chronic, recurrent BLE calf ulcers. Treated with 4 layer compression. Infrequently uses lymphedema pump. Bilateral lower extremity ulcerations healed as of June 2016. Fitted for custom compression stockings but did not receive them. Patient could not travel for lymphedema consult. She developed recurrent bilateral lower extremity ulcerations in August 2016. She is able  to heal these quite quickly with 4 layer elastic compression bandages. However, she is not very compliant using her juxtalite compression garments and tends to develop recurrent ulcerations while using juxtalite. Unna boots were applied this past week. She tolerated this well but performs minimal ambulation. She is scheduled to undergo total hip replacement tomorrow with [name]. She is unsure if he is aware of her ulcerations. She is otherwise without complaints. No significant pain. No fever or chills. Moderate clear drainage. Not on any antibiotics. 06/04/2015 -- she did have her hip surgery and has now been in rehabilitation and they're controlling her fluid intake and diuretics to the extent that she has lost 13 pounds of weight and her lower extremity circumference is decreased by 7 cm. 06/18/2015 -- she is still in the rehabilitation facility and continues to be looked after well and has lost a total of 15 pounds and her diabetes is under  much better control. Readmission: 06/03/2021 upon evaluation today patient actually appears to be doing somewhat poorly in regard to a wound on the right posterior lower leg. Patient does have a history of previously having been seen here in the clinic but this predates my time here back in 2017. Subsequently unfortunately right now she is having a significant issue with bleeding in the posterior of her right calf this appears to be a deep tissue injury as developed into an ulceration. She does have a history significant for lymphedema, diabetes mellitus type 2, diabetic neuropathy, hypertension, hypothyroidism, and obesity. She is currently in a skilled nursing facility. The dressing that she had in place during evaluation today was Xeroform followed by significant amount of gauze and this was severely stuck. Upon removal the patient was having significant bleeding she also had significant work done in the hospital in regard to her blood flow and [name] does look like he has her on 2 blood thinners Plavix and Lovenox. This is probably part of the reason why she is bleeding so significantly. With that being said I do believe that based on what we are seeing currently this is probably can to be something that were getting have to try to get stopped with more than just pressure to the region currently. I think surgery foam is probably can to be necessary. If we can get the stop of the Surgifoam she is probably can have to go to the hospital to try to get this bleeding stopped. 12/30; this is a patient who is currently in a skilled facility. She has wounds on her bilateral calfs likely secondary to severe chronic venous insufficiency and lymphedema the care of these wounds is complicated by the fact that she is on Lovenox and Plavix and bleeds very frequently with even minimal trauma to these wounds. It would appear that she had a Vaseline gauze on the surface of these in the process of undressing them  she bled freely from especially the right but also the left. I had to go in and use silver nitrate. The skin on her legs is significant for chronic venous stasis and lymphedema. Very dry flaking skin 07/16/2021; I actually was a patent person who saw this woman 3 weeks ago. She is a nursing facility has chronic venous wounds and bilateral lower extremity wounds. We use Xeroform under compression. The time she was on Lovenox and Plavix I thought I had an excellent reason for these wounds to bleed so freely. Since then the Lovenox has been stopped and should be well out  of her system but she is still on Plavix. She still has very friable bleeding wounds on the left lateral left Posterior and right Posterior lower extremity.These believe very freely when we take off the dressing Also she has a left heel wound which when we saw her last time was a surgical wound however I gather we have been asked to follow this as well. I am not exactly sure at this time what her surgery was but she comes in with a wound VAC on this and they are apparently changing it at the nursing home. 08/27/2021 upon evaluation patient appears to be doing better in regard to her wound on the legs as well as the heel. Fortunately at this point I do not see any need for a wound VAC which is good news overall I think that the Xeroform is probably a good option that is what is being utilized here as well. Fortunately I do not see any signs of active infection locally nor systemically which is great news and overall I think that the patient is making excellent progress. The area on her right gluteal area actually appears to be doing quite well and I am pleased with that. I do believe the silver alginate dressing here could be of benefit for her. 09/17/2021 upon evaluation patient appears to be doing actually better in regard to her wounds on the legs. Fortunately she does not seem to be showing any signs of worsening which is great news and  overall very pleased with where we stand. I do believe that she is tolerating the dressing changes without complication which is great news. [name], [name] ([id number]) [id number] Page 7 of 9 10-08-2021 upon evaluation today patient unfortunately continues to have significant issues with her legs. Unfortunately the biggest issue that we see Currently is that she is still having a lot of bleeding from her legs that she is also not able to move well due to her knee pain her left knee is quite significant as far as the discomfort is concerned and the right knee is getting equally as bad. Fortunately I do not see any evidence of active infection locally or systemically which is great news. Nonetheless I do believe that her heels are looking much better there is a tiny area open on the left heel the right is doing quite well. In regard to the gluteal/sacral region this appears to be healed. 10-29-2021 upon evaluation today patient unfortunately is appearing to do much worse compared to normal. She has more breakdown in regard to the left lateral leg and again I think this is pressure related. She also has more breakdown in regard to the gluteal area and she is very concerned about this in particular which is completely understandable. With that being said I do believe that she would benefit from more aggressive and appropriate offloading I discussed with her that she needs to allow the facility to change her position every 2 hours and she is can have to find different positions other than just lying on her back. Also discussed with her that obviously the facility needs to make sure to help her with repositioning every 2 hours I think both aspects of this are important. Otherwise her wound on the sacral area is going to continue to get worse and the leg will continue to get worse. 11-26-2021 upon evaluation today patient appears to be doing well with regard to her wounds all things  considered. The gluteal areas are all healed which is  great news. With that being said she is still having some issues here with her lower extremities. I do think that this is still something that we need to be very mindful of. Unfortunately the facility is still not washing her legs she has a lot of caked on dry skin with Xeroform we will get a try to get some of that off today. With that being said he does have reiterated in the past the patient needs to have her legs washed regularly. 12-24-2021 upon evaluation patient's wounds are showing signs of improvement albeit slowly. Fortunately I do not see any signs of infection locally or systemically which is great news. Nonetheless I do believe that the patient is doing quite well from the standpoint of the overall appearance of her wounds which are better than where they have been even if not where we would like them to be. 02-03-2022 upon evaluation today patient appears to be doing about the same in regard to her wounds. There is maybe a little bit of improvement in general based on what I am seeing currently but not much to be honest. I do think that the patient is making some progress here when it comes to the overall appearance of her wounds. 04-25-2022 upon evaluation patient actually is here for the first time in about 2-1/2 months. She has had multiple reasons why she did not make it part of the time she was sick and some of the times it was more of a transportation thing. Nonetheless I am glad that she is finally here good news is some of the wounds do appear to be a little bit drier and she has been using a alginate dressing at the facility which they switched her to honestly I cannot argue with the fact that it seems to be doing somewhat better in my opinion. With that being said I do believe one of the biggest issues that I see is simply that she is still having some issues here with pressure although I do not see as much of the dark  discoloration as we previously noted she has been using the Prevalon offloading boots which is excellent. I want her to keep using that as I think that still good to be ideal and optimal for her. She does inquire about being able to put pressure on her heels at this point. 06-02-2022 upon evaluation today patient appears to be unfortunately still having a lot of issues with her legs. Were not seeing a whole lot of improvement she is not as bad as she has been at some point in time but she is also not as good as we would like to see. 07-18-2022 upon evaluation today patient appears to be doing well currently in regard to her wounds on the legs all things considered. This is actually showing signs of improvement compared to where we have been. Overall I am extremely pleased in that regard. She does have a small area open on her sacral region but this is minimal at this point and actually just more of an irritation at the just the topical barrier cream is ideal here. 08-15-2022 upon evaluation today patient appears to be doing well currently in regard to her wounds all things considered. This is actually one of the better times that I have seen her as far as the overall appearance is concerned I think her heel is doing better she really does not allow me to do much with that as it hurts so badly. Her  legs also doing quite well although extremely hyper granulated with the Xeroform gauze. I think it would be worthwhile trying the Sorbact to see if this could be beneficial she does not remember doing this in the past. I think it could prevent it from sticking and then subsequently could also help pull away some of the drainage which will be helpful as well. 09-12-2022 upon evaluation today patient appears to be doing well currently in regard to her legs the left is better than the right on the right I am concerned a little bit about the possibility of infection I discussed that with her today. With that being  said I think that we may want to obtain a wound culture and we are going to go ahead and grab that today. 10-24-2022 upon evaluation today patient appears to be doing actually okay in some respects with regard to her legs currently. Fortunately there does not appear to be any signs of active infection locally nor systemically which is great news. No fevers, chills, nausea, vomiting, or diarrhea. 11-22-2022 upon evaluation today patient's legs actually look a little better in regard to the overall skin appearance compared to previous they have been washing her legs well at the facility which is good news. With that being said she still has some significant issues here with open wounds that again just really not getting a lot better they are very hyper granulated it has been worse but nonetheless it still pretty much circumferential as far as what we are seeing at this point which is not ideal. Again a lot of this especially on the posterior aspect is due to pressure and the fact that she cannot really lay in many different ways other than what she is doing currently on her back and that because of pressure on the back of the legs. Objective Constitutional Vitals Time Taken: 11:02 AM, Weight: 245.4 lbs, Temperature: 98.2 F, Pulse: 74 bpm, Respiratory Rate: 16 breaths/min, Blood Pressure: 125/76 mmHg. Integumentary (Hair, Skin) Wound #60 status is Open. Original cause of wound was Pressure Injury. The date acquired was: 05/17/2021. The wound has been in [name] 76 weeks. The wound is located on the Right,Circumferential Lower Leg. The wound measures 17.5cm length x 25.5cm width x 0.1cm depth; 350.484cm^2 area and 35.048cm^3 volume. There is Fat Layer (Subcutaneous Tissue) exposed. There is a large amount of sanguinous drainage noted. There is medium (34-66%) red, pink granulation within the wound bed. There is a small (1-33%) amount of necrotic tissue within the wound bed including Adherent  Slough. Wound #61 status is Open. Original cause of wound was Gradually Appeared. The date acquired was: 06/11/2021. The wound has been in [name] 73 weeks. The wound is located on the Left,Posterior Lower Leg. The wound measures 21cm length x 22cm width x 0.1cm depth; 362.854cm^2 area and 36.285cm^3 volume. There is Fat Layer (Subcutaneous Tissue) exposed. There is a large amount of serosanguineous drainage noted. There is medium (34-66%) red granulation within the wound bed. There is a small (1-33%) amount of necrotic tissue within the wound bed including Adherent Slough. Wound #62 status is Converted. Original cause of wound was Gradually Appeared. The date acquired was: 06/11/2021. The wound has been in [name] 73 weeks. The wound is located on the Left,Lateral Lower Leg. There is a large amount of sanguinous drainage noted. [name], [name] ([id number]) [id number] Page 8 of 9 Assessment Active Problems ICD-10 Lymphedema, not elsewhere classified Type 2 diabetes mellitus with other skin ulcer Type 2 diabetes mellitus  with diabetic neuropathy, unspecified Pressure ulcer of other site, stage 3 Pressure-induced deep tissue damage of other site Pressure ulcer of sacral region, stage 3 Essential (primary) hypertension Other specified hypothyroidism Other obesity due to excess calories Plan Follow-up Appointments: Wound #60 Right,Circumferential Lower Leg: Return Appointment in 1 month Bathing/ Shower/ Hygiene: Wash wounds with antibacterial soap and water. - WASH LEGS WITH SOAP and WATER before applying dressings. No tub bath. Edema Control - Lymphedema / Segmental Compressive Device / Other: Other: - Lymphedema-use ace wraps to help control swelling. Off-Loading: Turn and reposition every 2 hours - PLEASE MAKE SURE THIS IS COMPLETED EVERY TWO HOURS THANK YOU Other: - Put pillows under knees to keep pressure off of calves. Deep tissue injury on  posterior calves. Turn on side every 1 hous to keep pressure off calves wear offloading boots Additional Orders / Instructions: Follow Nutritious Diet and Increase Protein Intake WOUND #60: - Lower Leg Wound Laterality: Right, Circumferential Cleanser: Normal Saline 3 x Per Week/30 Days Discharge Instructions: Wash your hands with soap and water. Remove old dressing, discard into plastic bag and place into trash. Cleanse the wound with Normal Saline prior to applying a clean dressing using gauze sponges, not tissues or cotton balls. Do not scrub or use excessive force. Pat dry using gauze sponges, not tissue or cotton balls. Peri-Wound Care: AandD Ointment 3 x Per Week/30 Days Discharge Instructions: Apply AandD Ointment to entire lower legs including wounds Prim Dressing: Cutimed Sorbact 1.5x 2.38 (in/in) 3 x Per Week/30 Days ary Discharge Instructions: A bacteria- and fungi binding wound dressing, suitable for cavities and fistulas. It is suitable as a wound filler and allows the passage of wound exudate into a secondary dressing. The dressing helps reducing odor and pain and can improve healing. Secondary Dressing: ABD Pad 5x9 (in/in) 3 x Per Week/30 Days Discharge Instructions: 2. Cover with ABD pad Secured With: ACE WRAP - 61M ACE Elastic Bandage With VELCRO Brand Closure, 4 (in) 3 x Per Week/30 Days Discharge Instructions: 4. Ace wrap to secure dressing in place and control swelling. Secured With: [employer] or Non-Sterile 6-ply 4.5x4 (yd/yd) 3 x Per Week/30 Days Discharge Instructions: 3. Apply Kerlix as directed WOUND #61: - Lower Leg Wound Laterality: Left, Posterior Cleanser: Normal Saline 3 x Per Week/30 Days Discharge Instructions: Wash your hands with soap and water. Remove old dressing, discard into plastic bag and place into trash. Cleanse the wound with Normal Saline prior to applying a clean dressing using gauze sponges, not tissues or cotton balls. Do not scrub or  use excessive force. Pat dry using gauze sponges, not tissue or cotton balls. Peri-Wound Care: AandD Ointment 3 x Per Week/30 Days Discharge Instructions: Apply AandD Ointment to entire lower legs including wounds Prim Dressing: Cutimed Sorbact 1.5x 2.38 (in/in) 3 x Per Week/30 Days ary Discharge Instructions: A bacteria- and fungi binding wound dressing, suitable for cavities and fistulas. It is suitable as a wound filler and allows the passage of wound exudate into a secondary dressing. The dressing helps reducing odor and pain and can improve healing. Secondary Dressing: ABD Pad 5x9 (in/in) 3 x Per Week/30 Days Discharge Instructions: 2. Cover with ABD pad Secured With: ACE WRAP - 61M ACE Elastic Bandage With VELCRO Brand Closure, 4 (in) 3 x Per Week/30 Days Discharge Instructions: 4. Ace wrap to secure dressing in place and control swelling. Secured With: [employer] or Non-Sterile 6-ply 4.5x4 (yd/yd) 3 x Per Week/30 Days Discharge Instructions: 3. Apply Kerlix  as directed 1. Based on what I am seeing I do believe that we are making some pretty good progress here with regard to the skin appearance for the areas that are healed but again there is still quite a bit open that is not good. We are using AandD ointment this just needs to be applied in a thin amount to the leg in general and that after cleaning the leg well and then subsequently were using the Sorbact followed by ABD pads and an Ace wrap. 2. I am also can recommend that we have the patient continue with the wound care measures with regard to changing this 3 times per week she did inquire about the possibility of doing this daily my problem with the daily dressing change considering how much she believes with the dressing changes is that this would worsen and just aggravate her irritation to the wounds much more than what we are seeing right now. Again I cannot say for certain that that would be helpful but I really feel like  it would be more detrimental than helpful to be honest. I think I would like to give this a try for 2 more weeks just to make sure that were not seeing better improvement before making a switch as such. We will see patient back for reevaluation in 2 weeks here in the clinic. If anything worsens or changes patient will contact our office for additional recommendations. Electronic Signature(s) Signed: 11/22/2022 1:30:45 PM By: Dorathy Daft, Lalla K ([id number]) By: Dionne Ano [phone].pdf Page 9 of 9 Signed: 11/22/2022 1:30:45 PM Entered By: [name] on 11/22/2022 13:30:45 -------------------------------------------------------------------------------- SuperBill Details Patient Name: Date of Service: [name], [name] 11/22/2022 Medical Record Number: [id number] Patient Account Number: [id number] Date of Birth/Sex: Treating RN: 1955/09/14 (67 y.o. [name] Primary Care Provider: Elizabeth [name] Other Clinician: Betha [name] Referring Provider: Treating Provider/Extender: [name]: 74 Diagnosis Coding ICD-10 Codes Code Description I89.0 Lymphedema, not elsewhere classified E11.622 Type 2 diabetes mellitus with other skin ulcer E11.40 Type 2 diabetes mellitus with diabetic neuropathy, unspecified L89.893 Pressure ulcer of other site, stage 3 L89.896 Pressure-induced deep tissue damage of other site L89.153 Pressure ulcer of sacral region, stage 3 I10 Essential (primary) hypertension E03.8 Other specified hypothyroidism E66.09 Other obesity due to excess calories Facility Procedures : CPT4 Code: [id number] Description: 99213 - WOUND CARE VISIT-LEV 3 EST PT Modifier: Quantity: 1 Physician Procedures : CPT4 Code Description Modifier [id number] 99213 - WC PHYS LEVEL 3 - EST PT ICD-10 Diagnosis Description I89.0 Lymphedema, not elsewhere classified E11.622 Type 2 diabetes mellitus with other skin ulcer  E11.40 Type 2 diabetes mellitus with diabetic  neuropathy, unspecified L89.893 Pressure ulcer of other site, stage 3 Quantity: 1 Electronic Signature(s) Signed: 11/22/2022 1:32:16 PM By: Autumn Derry PA-C Entered By: [name] on 11/22/2022 13:32:15

## 2022-11-24 DIAGNOSIS — Z7189 Other specified counseling: Secondary | ICD-10-CM | POA: Diagnosis not present

## 2022-11-24 DIAGNOSIS — E119 Type 2 diabetes mellitus without complications: Secondary | ICD-10-CM | POA: Diagnosis not present

## 2022-12-05 DIAGNOSIS — G8929 Other chronic pain: Secondary | ICD-10-CM | POA: Diagnosis not present

## 2022-12-06 ENCOUNTER — Ambulatory Visit: Payer: Medicare HMO | Admitting: Physician Assistant

## 2022-12-08 ENCOUNTER — Ambulatory Visit: Payer: Medicare HMO | Admitting: Physician Assistant

## 2022-12-14 DIAGNOSIS — E039 Hypothyroidism, unspecified: Secondary | ICD-10-CM | POA: Diagnosis not present

## 2022-12-14 DIAGNOSIS — I739 Peripheral vascular disease, unspecified: Secondary | ICD-10-CM | POA: Diagnosis not present

## 2022-12-14 DIAGNOSIS — E559 Vitamin D deficiency, unspecified: Secondary | ICD-10-CM | POA: Diagnosis not present

## 2022-12-14 DIAGNOSIS — D509 Iron deficiency anemia, unspecified: Secondary | ICD-10-CM | POA: Diagnosis not present

## 2022-12-14 DIAGNOSIS — I10 Essential (primary) hypertension: Secondary | ICD-10-CM | POA: Diagnosis not present

## 2022-12-14 DIAGNOSIS — G8929 Other chronic pain: Secondary | ICD-10-CM | POA: Diagnosis not present

## 2022-12-14 DIAGNOSIS — E119 Type 2 diabetes mellitus without complications: Secondary | ICD-10-CM | POA: Diagnosis not present

## 2022-12-14 DIAGNOSIS — I890 Lymphedema, not elsewhere classified: Secondary | ICD-10-CM | POA: Diagnosis not present

## 2022-12-15 DIAGNOSIS — L603 Nail dystrophy: Secondary | ICD-10-CM | POA: Diagnosis not present

## 2022-12-15 DIAGNOSIS — E039 Hypothyroidism, unspecified: Secondary | ICD-10-CM | POA: Diagnosis not present

## 2022-12-15 DIAGNOSIS — D509 Iron deficiency anemia, unspecified: Secondary | ICD-10-CM | POA: Diagnosis not present

## 2022-12-15 DIAGNOSIS — E1151 Type 2 diabetes mellitus with diabetic peripheral angiopathy without gangrene: Secondary | ICD-10-CM | POA: Diagnosis not present

## 2022-12-20 ENCOUNTER — Encounter: Payer: Medicare HMO | Attending: Physician Assistant | Admitting: Physician Assistant

## 2022-12-20 DIAGNOSIS — L97828 Non-pressure chronic ulcer of other part of left lower leg with other specified severity: Secondary | ICD-10-CM | POA: Diagnosis not present

## 2022-12-20 DIAGNOSIS — N186 End stage renal disease: Secondary | ICD-10-CM | POA: Diagnosis not present

## 2022-12-20 DIAGNOSIS — I890 Lymphedema, not elsewhere classified: Secondary | ICD-10-CM | POA: Insufficient documentation

## 2022-12-20 DIAGNOSIS — M1990 Unspecified osteoarthritis, unspecified site: Secondary | ICD-10-CM | POA: Insufficient documentation

## 2022-12-20 DIAGNOSIS — Z743 Need for continuous supervision: Secondary | ICD-10-CM | POA: Diagnosis not present

## 2022-12-20 DIAGNOSIS — E11622 Type 2 diabetes mellitus with other skin ulcer: Secondary | ICD-10-CM | POA: Diagnosis not present

## 2022-12-20 DIAGNOSIS — Z7902 Long term (current) use of antithrombotics/antiplatelets: Secondary | ICD-10-CM | POA: Insufficient documentation

## 2022-12-20 DIAGNOSIS — E039 Hypothyroidism, unspecified: Secondary | ICD-10-CM | POA: Diagnosis not present

## 2022-12-20 DIAGNOSIS — L89896 Pressure-induced deep tissue damage of other site: Secondary | ICD-10-CM | POA: Insufficient documentation

## 2022-12-20 DIAGNOSIS — L89893 Pressure ulcer of other site, stage 3: Secondary | ICD-10-CM | POA: Diagnosis not present

## 2022-12-20 DIAGNOSIS — E1151 Type 2 diabetes mellitus with diabetic peripheral angiopathy without gangrene: Secondary | ICD-10-CM | POA: Diagnosis not present

## 2022-12-20 DIAGNOSIS — E1122 Type 2 diabetes mellitus with diabetic chronic kidney disease: Secondary | ICD-10-CM | POA: Insufficient documentation

## 2022-12-20 DIAGNOSIS — R279 Unspecified lack of coordination: Secondary | ICD-10-CM | POA: Diagnosis not present

## 2022-12-20 DIAGNOSIS — L89153 Pressure ulcer of sacral region, stage 3: Secondary | ICD-10-CM | POA: Diagnosis not present

## 2022-12-20 DIAGNOSIS — I120 Hypertensive chronic kidney disease with stage 5 chronic kidney disease or end stage renal disease: Secondary | ICD-10-CM | POA: Diagnosis not present

## 2022-12-20 DIAGNOSIS — R69 Illness, unspecified: Secondary | ICD-10-CM | POA: Diagnosis not present

## 2022-12-20 DIAGNOSIS — Z7401 Bed confinement status: Secondary | ICD-10-CM | POA: Diagnosis not present

## 2022-12-20 DIAGNOSIS — E038 Other specified hypothyroidism: Secondary | ICD-10-CM | POA: Insufficient documentation

## 2022-12-20 DIAGNOSIS — D509 Iron deficiency anemia, unspecified: Secondary | ICD-10-CM | POA: Diagnosis not present

## 2022-12-20 NOTE — Progress Notes (Signed)
ZAKYLA, TONCHE ([id number]) [id number] Page 1 of 2 Visit Report for 12/20/2022 Chief Complaint Document Details Patient Name: KIMBLEY, SPRAGUE LYN K. Date of Service: 12/20/2022 10:45 A M Medical Record Number: [id number] Patient Account Number: [id number] Date of Birth/Sex: Treating RN: Nov 03, 1955 (67 y.o. 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 New sacral wound as of 10/29/21 Electronic Signature(s) Signed: 12/20/2022 11:02:25 AM By: Allen Derry PA-C Entered By: Allen Derry on 12/20/2022 11:02:25 -------------------------------------------------------------------------------- Problem List Details Patient Name: Aileen Pilot LYN K. Date of Service: 12/20/2022 10:45 A M Medical Record Number: [id number] Patient Account Number: [id number] Date of Birth/Sex: Treating RN: 05-10-1956 (67 y.o. [name] Primary Care Provider: Elizabeth Sauer Other Clinician: Betha Loa Referring Provider: Treating Provider/Extender: Althia Forts Weeks in Treatment: 7 Active Problems ICD-10 Encounter Code Description Active Date MDM Diagnosis I89.0 Lymphedema, not elsewhere classified 06/03/2021 No Yes E11.622 Type 2 diabetes mellitus with other skin ulcer 06/03/2021 No Yes E11.40 Type 2 diabetes mellitus with diabetic neuropathy, 06/03/2021 No Yes unspecified ADILENNE, ASHWORTH ([id number]) [id number] Page 2 of 2 [phone] Pressure ulcer of other site, stage 3 06/03/2021 No Yes L89.896 Pressure-induced deep tissue damage of other site 06/03/2021 No Yes L89.153 Pressure ulcer of sacral region, stage 3 08/27/2021 No Yes I10 Essential (primary) hypertension 06/03/2021 No  Yes E03.8 Other specified hypothyroidism 06/03/2021 No Yes E66.09 Other obesity due to excess calories 06/03/2021 No Yes Inactive Problems Resolved Problems Electronic Signature(s) Signed: 12/20/2022 11:01:02 AM By: Allen Derry PA-C Entered By: Allen Derry on 12/20/2022 11:01:02

## 2022-12-21 NOTE — Progress Notes (Signed)
Autumn, [name] ([id number]) [id number] Page 1 of 11 Visit Report for 12/20/2022 Arrival Information Details Patient Name: Date of Service: [name], [name] 12/20/2022 10:45 A M Medical Record Number: [id number] Patient Account Number: [id number] Date of Birth/Sex: Treating RN: [birth year] (67 y.o. [name] Primary Care [place]: Elizabeth Sauer Other Clinician: Betha Loa Referring Edwyna Dangerfield: Treating Elim Economou/Extender: Althia Forts Weeks in Treatment: 1 Visit Information History Since Last Visit All ordered tests and consults were completed: No Patient Arrived: Stretcher Added or deleted any medications: No Arrival Time: 10:46 Any new allergies or adverse reactions: No Transfer Assistance: Manual Had a fall or experienced change in No Patient Identification Verified: Yes activities of daily living that may affect Secondary Verification Process Completed: Yes risk of falls: Patient Requires Transmission-Based Precautions: No Signs or symptoms of abuse/neglect since last visito No Patient Has Alerts: Yes Hospitalized since last visit: No Patient Alerts: Patient on Blood Thinner Implantable device outside of the clinic excluding No TYPE II Diabetic cellular tissue based products placed in the center Plavix and Lovenox since last visit: [facility] Has Dressing in Place as Prescribed: Yes Has Compression in Place as Prescribed: Yes Pain Present Now: Yes Electronic Signature(s) Signed: 12/21/2022 5:16:31 PM By: Betha Loa Entered By: Betha Loa on 12/20/2022 10:53:02 -------------------------------------------------------------------------------- Clinic Level of Care Assessment Details Patient Name: Date of Service: Autumn, [name] 12/20/2022 10:45 A M Medical Record Number: [id number] Patient Account Number: [id number] Date of Birth/Sex: Treating RN: 1956-03-12 (67 y.o. [name] Primary Care  Kaycen Whitworth: Elizabeth Sauer Other Clinician: Betha Loa Referring Amyah Clawson: Treating Vernie Piet/Extender: Althia Forts Weeks in Treatment: 44 Clinic Level of Care Assessment Items TOOL 1 Quantity Score []  - 0 Use when EandM and Procedure is performed on INITIAL visit ASSESSMENTS - Nursing Assessment / Reassessment []  - 0 General Physical Exam (combine w/ comprehensive assessment (listed just below) when performed on new pt. [address]TEARIA, GIBBS ([id number]) [id number] Page 2 of 11 []  - 0 Comprehensive Assessment (HX, ROS, Risk Assessments, Wounds Hx, etc.) ASSESSMENTS - Wound and Skin Assessment / Reassessment []  - 0 Dermatologic / Skin Assessment (not related to wound area) ASSESSMENTS - Ostomy and/or Continence Assessment and Care []  - 0 Incontinence Assessment and Management []  - 0 Ostomy Care Assessment and Management (repouching, etc.) PROCESS - Coordination of Care []  - 0 Simple Patient / Family Education for ongoing care []  - 0 Complex (extensive) Patient / Family Education for ongoing care []  - 0 Staff obtains Chiropractor, Records, T Results / Process Orders est []  - 0 Staff telephones HHA, Nursing Homes / Clarify orders / etc []  - 0 Routine Transfer to another Facility (non-emergent condition) []  - 0 Routine Hospital Admission (non-emergent condition) []  - 0 New Admissions / Manufacturing engineer / Ordering NPWT Apligraf, etc. , []  - 0 Emergency Hospital Admission (emergent condition) PROCESS - Special Needs []  - 0 Pediatric / Minor Patient Management []  - 0 Isolation Patient Management []  - 0 Hearing / Language / Visual special needs []  - 0 Assessment of Community assistance (transportation, D/C planning, etc.) []  - 0 Additional assistance / Altered mentation []  - 0 Support Surface(s) Assessment (bed, cushion, seat, etc.) INTERVENTIONS - Miscellaneous []  - 0 External ear exam []  - 0 Patient Transfer  (multiple staff / Nurse, adult / Similar devices) []  - 0 Simple Staple / Suture removal (25 or less) []  - 0 Complex Staple / Suture removal (26 or more) []  - 0 Hypo/Hyperglycemic Management (  do not check if billed separately) []  - 0 Ankle / Brachial Index (ABI) - do not check if billed separately Has the patient been seen at the hospital within the last three years: Yes Total Score: 0 Level Of Care: ____ Electronic Signature(s) Signed: 12/21/2022 5:16:31 PM By: Betha Loa Entered By: Betha Loa on 12/20/2022 12:10:00 -------------------------------------------------------------------------------- Encounter Discharge Information Details Patient Name: Date of Service: [name]. 12/20/2022 10:45 A M Medical Record Number: [id number] Patient Account Number: [id number] Date of Birth/Sex: Treating RN: Nov 21, 1955 (67 y.o. [name] Primary Care Roanna Reaves: Elizabeth Sauer Other Clinician: Aira, Sallade ([id number]) [id number] Page 3 of 11 Referring Catherin Doorn: Treating Quin Mcpherson/Extender: Althia Forts Weeks in Treatment: 86 Encounter Discharge Information Items Discharge Condition: Stable Ambulatory Status: Stretcher Discharge Destination: Skilled Nursing Facility Telephoned: No Orders Sent: Yes Transportation: Ambulance Accompanied By: self Schedule Follow-up Appointment: Yes Clinical Summary of Care: Electronic Signature(s) Signed: 12/21/2022 5:16:31 PM By: Betha Loa Entered By: Betha Loa on 12/20/2022 12:11:53 -------------------------------------------------------------------------------- Lower Extremity Assessment Details Patient Name: Date of Service: Autumn, [name] 12/20/2022 10:45 A M Medical Record Number: [id number] Patient Account Number: [id number] Date of Birth/Sex: Treating RN: [birth year] (67 y.o. [name] Primary Care Coy Vandoren: Elizabeth Sauer Other Clinician: Betha Loa Referring Rhyanna Sorce: Treating Essica Kiker/Extender: Althia Forts Weeks in Treatment: 77 Electronic Signature(s) Signed: 12/20/2022 2:41:28 PM By: Midge Aver MSN RN CNS WTA Signed: 12/21/2022 5:16:31 PM By: Betha Loa Entered By: Betha Loa on 12/20/2022 11:39:27 -------------------------------------------------------------------------------- Multi Wound Chart Details Patient Name: Date of Service: [name]. 12/20/2022 10:45 A M Medical Record Number: [id number] Patient Account Number: [id number] Date of Birth/Sex: Treating RN: 1955-11-30 (67 y.o. [name] Primary Care Gessica Jawad: Elizabeth Sauer Other Clinician: Betha Loa Referring Madoline Bhatt: Treating Sunset Joshi/Extender: Althia Forts Weeks in Treatment: 27 Vital Signs Height(in): Pulse(bpm): 74 Weight(lbs): 245.4 Blood Pressure(mmHg): 147/76 Body Mass Index(BMI): Temperature(F): 98.0 Grosz, Nelwyn K ([id number]) [id number] Page 4 of 11 Respiratory Rate(breaths/min): 18 [60:Photos:] Right, Circumferential Lower Leg Right, Circumferential Lower Leg Left, Circumferential Lower Leg Wound Location: Pressure Injury Pressure Injury Gradually Appeared Wounding Event: Pressure Ulcer Pressure Ulcer Lymphedema Primary Etiology: Lymphedema, Hypertension, Lymphedema, Hypertension, Lymphedema, Hypertension, Comorbid History: Peripheral Arterial Disease, Peripheral Peripheral Arterial Disease, Peripheral Peripheral Arterial Disease, Peripheral Venous Disease, Type II Diabetes, Venous Disease, Type II Diabetes, Venous Disease, Type II Diabetes, End Stage Renal Disease, End Stage Renal Disease, End Stage Renal Disease, Osteoarthritis, Neuropathy Osteoarthritis, Neuropathy Osteoarthritis, Neuropathy 05/17/2021 05/17/2021 06/11/2021 Date Acquired: 80 80 77 Weeks of Treatment: Open Open Open Wound Status: No No No Wound Recurrence: 18.5x23.5x0.1  18.5x23.5x0.1 21x33x0.1 Measurements L x W x D (cm) 341.452 341.452 544.281 A (cm) : rea 34.145 34.145 54.428 Volume (cm) : -60334.00% -60334.00% -1611.10% % Reduction in Area: -59803.50% -59803.50% -1611.00% % Reduction in Volume: Category/Stage III Category/Stage III Full Thickness With Exposed Support Classification: Structures Large Large Large Exudate A mount: Sanguinous Sanguinous Serosanguineous Exudate Type: red red red, brown Exudate Color: Medium (34-66%) N/A Medium (34-66%) Granulation A mount: Red, Pink N/A Red Granulation Quality: Small (1-33%) N/A Small (1-33%) Necrotic A mount: Fat Layer (Subcutaneous Tissue): Yes N/A Fat Layer (Subcutaneous Tissue): Yes Exposed Structures: Small (1-33%) N/A None Epithelialization: Treatment Notes Electronic Signature(s) Signed: 12/21/2022 5:16:31 PM By: Betha Loa Entered By: Betha Loa on 12/20/2022 11:39:33 -------------------------------------------------------------------------------- Multi-Disciplinary Care Plan Details Patient Name: Date of Service: [name]. 12/20/2022 10:45 A M Medical Record Number: [id number] Patient Account Number: [id number] Date  of Birth/Sex: Treating RN: [birth year] (67 y.o. [name] Primary Care Dong Nimmons: Elizabeth Sauer Other Clinician: Navaya, Wiatrek ([id number]) [id number] Page 5 of 11 Referring Marquett Bertoli: Treating Beverlee Wilmarth/Extender: Althia Forts Weeks in Treatment: 80 Active Inactive Necrotic Tissue Nursing Diagnoses: Impaired tissue integrity related to necrotic/devitalized tissue Knowledge deficit related to management of necrotic/devitalized tissue Goals: Necrotic/devitalized tissue will be minimized in the wound bed Date Initiated: 11/26/2021 Target Resolution Date: 11/26/2021 Goal Status: Active Patient/caregiver will verbalize understanding of reason and process for debridement of necrotic  tissue Date Initiated: 11/26/2021 Target Resolution Date: 11/26/2021 Goal Status: Active Interventions: Assess patient pain level pre-, during and post procedure and prior to discharge Provide education on necrotic tissue and debridement process Treatment Activities: Apply topical anesthetic as ordered : 11/26/2021 Notes: Venous Leg Ulcer Nursing Diagnoses: Actual venous Insuffiency (use after diagnosis is confirmed) Knowledge deficit related to disease process and management Potential for venous Insuffiency (use before diagnosis confirmed) Goals: Patient will maintain optimal edema control Date Initiated: 11/26/2021 Target Resolution Date: 11/26/2021 Goal Status: Active Interventions: Assess peripheral edema status every visit. Notes: Wound/Skin Impairment Nursing Diagnoses: Impaired tissue integrity Knowledge deficit related to smoking impact on wound healing Knowledge deficit related to ulceration/compromised skin integrity Goals: Patient/caregiver will verbalize understanding of skin care regimen Date Initiated: 06/03/2021 Target Resolution Date: 06/03/2021 Goal Status: Active Interventions: Assess ulceration(s) every visit Provide education on ulcer and skin care Treatment Activities: Skin care regimen initiated : 06/03/2021 Topical wound management initiated : 06/03/2021 Notes: Electronic Signature(s) Signed: 12/20/2022 2:41:28 PM By: Midge Aver MSN RN CNS WTA Signed: 12/21/2022 5:16:31 PM By: Betha Loa Entered By: Betha Loa on 12/20/2022 12:10:33 Corwin Levins ([id number]) [id number] Page 6 of 11 -------------------------------------------------------------------------------- Pain Assessment Details Patient Name: Date of Service: RICKIA, FREEBURG 12/20/2022 10:45 A M Medical Record Number: [id number] Patient Account Number: [id number] Date of Birth/Sex: Treating RN: 06/21/1956 (67 y.o. [name] Primary Care Aulden Calise: Elizabeth Sauer Other Clinician: Betha Loa Referring Silvina Hackleman: Treating Magaly Pollina/Extender: Althia Forts Weeks in Treatment: 72 Active Problems Location of Pain Severity and Description of Pain Patient Has Paino Yes Site Locations Pain Location: Generalized Pain, Pain in Ulcers With Dressing Change: No Duration of the Pain. Constant / Intermittento Constant Rate the pain. Current Pain Level: 9 Character of Pain Describe the Pain: Aching, Sharp Pain Management and Medication Current Pain Management: Medication: Yes Cold Application: No Rest: No Massage: No Activity: No T.E.N.S.: No Heat Application: No Leg drop or elevation: No Is the Current Pain Management Adequate: Inadequate How does your wound impact your activities of daily livingo Sleep: No Bathing: No Appetite: No Relationship With Others: No Bladder Continence: No Emotions: No Bowel Continence: No Work: No Toileting: No Drive: No Dressing: No Hobbies: No Electronic Signature(s) Signed: 12/20/2022 2:41:28 PM By: Midge Aver MSN RN CNS WTA Signed: 12/21/2022 5:16:31 PM By: Betha Loa Entered By: Betha Loa on 12/20/2022 10:56:17 Corwin Levins ([id number]) [id number] Page 7 of 11 -------------------------------------------------------------------------------- Patient/Caregiver Education Details Patient Name: Date of Service: KYNSLEIGH, WESTENDORF 6/25/2024andnbsp10:45 A M Medical Record Number: [id number] Patient Account Number: [id number] Date of Birth/Gender: Treating RN: [birth year] (67 y.o. [name] Primary Care Physician: Elizabeth Sauer Other Clinician: Betha Loa Referring Physician: Treating Physician/Extender: Althia Forts Weeks in Treatment: 75 Education Assessment Education Provided To: Patient Education Topics Provided Wound/Skin Impairment: Handouts: Other: continue wound care as directed Methods:  Explain/Verbal Responses: State content correctly Electronic Signature(s) Signed: 12/21/2022 5:16:31 PM  By: Betha Loa Entered By: Betha Loa on 12/20/2022 12:10:51 -------------------------------------------------------------------------------- Wound Assessment Details Patient Name: Date of Service: KINLEY, DOZIER 12/20/2022 10:45 A M Medical Record Number: [id number] Patient Account Number: [id number] Date of Birth/Sex: Treating RN: 08-14-1955 (67 y.o. [name] Primary Care Montravious Weigelt: Elizabeth Sauer Other Clinician: Betha Loa Referring Caisen Mangas: Treating Antania Hoefling/Extender: Althia Forts Weeks in Treatment: 80 Wound Status Wound Number: 60 Primary Pressure Ulcer Etiology: Wound Location: Right, Circumferential Lower Leg Wound Open Wounding Event: Pressure Injury Status: Date Acquired: 05/17/2021 Comorbid Lymphedema, Hypertension, Peripheral Arterial Disease, Peripheral Weeks Of Treatment: 80 History: Venous Disease, Type II Diabetes, End Stage Renal Disease, Clustered Wound: No Osteoarthritis, Neuropathy Photos THEO, REITHER ([id number]) [id number] Page 8 of 11 Wound Measurements Length: (cm) 18.5 Width: (cm) 23.5 Depth: (cm) 0.1 Area: (cm) 341.452 Volume: (cm) 34.145 % Reduction in Area: -60334% % Reduction in Volume: -59803.5% Epithelialization: Small (1-33%) Wound Description Classification: Category/Stage III Exudate Amount: Large Exudate Type: Sanguinous Exudate Color: red Foul Odor After Cleansing: No Slough/Fibrino Yes Wound Bed Granulation Amount: Medium (34-66%) Exposed Structure Granulation Quality: Red, Pink Fat Layer (Subcutaneous Tissue) Exposed: Yes Necrotic Amount: Small (1-33%) Necrotic Quality: Adherent Scientist, physiological) Signed: 12/20/2022 2:41:28 PM By: Midge Aver MSN RN CNS WTA Signed: 12/21/2022 5:16:31 PM By: Betha Loa Entered By: Betha Loa on 12/20/2022  11:38:34 -------------------------------------------------------------------------------- Wound Assessment Details Patient Name: Date of Service: [name]. 12/20/2022 10:45 A M Medical Record Number: [id number] Patient Account Number: [id number] Date of Birth/Sex: Treating RN: [birth year] (67 y.o. [name] Primary Care Mahari Strahm: Elizabeth Sauer Other Clinician: Betha Loa Referring Abdulahi Schor: Treating [name]/Extender: Althia Forts Weeks in Treatment: 80 Wound Status Wound Number: 60 Primary Pressure Ulcer Etiology: Wound Location: Right, Circumferential Lower Leg Wound Open Wounding Event: Pressure Injury Status: Date Acquired: 05/17/2021 Comorbid Lymphedema, Hypertension, Peripheral Arterial Disease, Peripheral Weeks Of Treatment: 80 History: Venous Disease, Type II Diabetes, End Stage Renal Disease, Clustered Wound: No Osteoarthritis, Neuropathy Photos VERNELL, BACK ([id number]) [id number] Page 9 of 11 Wound Measurements Length: (cm) 18.5 Width: (cm) 23.5 Depth: (cm) 0.1 Area: (cm) 341.452 Volume: (cm) 34.145 % Reduction in Area: -60334% % Reduction in Volume: -59803.5% Wound Description Classification: Category/Stage III Exudate Amount: Large Exudate Type: Sanguinous Exudate Color: red Treatment Notes Wound #60 (Lower Leg) Wound Laterality: Right, Circumferential Cleanser Normal Saline Discharge Instruction: Wash your hands with soap and water. Remove old dressing, discard into plastic bag and place into trash. Cleanse the wound with Normal Saline prior to applying a clean dressing using gauze sponges, not tissues or cotton balls. Do not scrub or use excessive force. Pat dry using gauze sponges, not tissue or cotton balls. Peri-Wound Care AandD Ointment Discharge Instruction: Apply AandD Ointment to entire lower legs including wounds Topical Primary Dressing Cutimed Sorbact 1.5x 2.38 (in/in) Discharge  Instruction: A bacteria- and fungi binding wound dressing, suitable for cavities and fistulas. It is suitable as a wound filler and allows the passage of wound exudate into a secondary dressing. The dressing helps reducing odor and pain and can improve healing. Secondary Dressing ABD Pad 5x9 (in/in) Discharge Instruction: 2. Cover with ABD pad Secured With ACE WRAP - 24M ACE Elastic Bandage With VELCRO Brand Closure, 4 (in) Discharge Instruction: 4. Ace wrap to secure dressing in place and control swelling. Kerlix Roll Sterile or Non-Sterile 6-ply 4.5x4 (yd/yd) Discharge Instruction: 3. Apply Kerlix as directed Compression Wrap Compression Stockings Add-Ons Electronic Signature(s) Signed: 12/20/2022 2:41:28 PM By: Midge Aver MSN  RN CNS WTA Signed: 12/21/2022 5:16:31 PM By: Betha Loa Entered By: Betha Loa on 12/20/2022 11:39:17 Corwin Levins ([id number]) [id number] Page 10 of 11 -------------------------------------------------------------------------------- Wound Assessment Details Patient Name: Date of Service: RANDY, WHITENER 12/20/2022 10:45 A M Medical Record Number: [id number] Patient Account Number: [id number] Date of Birth/Sex: Treating RN: 01-Jul-1955 (67 y.o. [name] Primary Care Denzil Bristol: Elizabeth Sauer Other Clinician: Betha Loa Referring Muskan Bolla: Treating Rodolphe Edmonston/Extender: Althia Forts Weeks in Treatment: 80 Wound Status Wound Number: 61 Primary Lymphedema Etiology: Wound Location: Left, Circumferential Lower Leg Wound Open Wounding Event: Gradually Appeared Status: Date Acquired: 06/11/2021 Comorbid Lymphedema, Hypertension, Peripheral Arterial Disease, Peripheral Weeks Of Treatment: 77 History: Venous Disease, Type II Diabetes, End Stage Renal Disease, Clustered Wound: No Osteoarthritis, Neuropathy Photos Wound Measurements Length: (cm) 21 Width: (cm) 33 Depth: (cm) 0.1 Area: (cm)  544.281 Volume: (cm) 54.428 % Reduction in Area: -1611.1% % Reduction in Volume: -1611% Epithelialization: None Wound Description Classification: Full Thickness With Exposed Suppo Exudate Amount: Large Exudate Type: Serosanguineous Exudate Color: red, brown rt Structures Foul Odor After Cleansing: No Slough/Fibrino Yes Wound Bed Granulation Amount: Medium (34-66%) Exposed Structure Granulation Quality: Red Fat Layer (Subcutaneous Tissue) Exposed: Yes Necrotic Amount: Small (1-33%) Necrotic Quality: Adherent Slough Treatment Notes Wound #61 (Lower Leg) Wound Laterality: Left, Circumferential Cleanser Normal Saline Discharge Instruction: Wash your hands with soap and water. Remove old dressing, discard into plastic bag and place into trash. Cleanse the wound with Normal Saline prior to applying a clean dressing using gauze sponges, not tissues or cotton balls. Do not scrub or use excessive force. Pat dry using gauze sponges, not tissue or cotton balls. Peri-Wound Care AandD CORABELLE, SPACKMAN ([id number]) [id number] Page 11 of 11 Discharge Instruction: Apply AandD Ointment to entire lower legs including wounds Topical Primary Dressing Cutimed Sorbact 1.5x 2.38 (in/in) Discharge Instruction: A bacteria- and fungi binding wound dressing, suitable for cavities and fistulas. It is suitable as a wound filler and allows the passage of wound exudate into a secondary dressing. The dressing helps reducing odor and pain and can improve healing. Secondary Dressing ABD Pad 5x9 (in/in) Discharge Instruction: 2. Cover with ABD pad Secured With ACE WRAP - 12M ACE Elastic Bandage With VELCRO Brand Closure, 4 (in) Discharge Instruction: 4. Ace wrap to secure dressing in place and control swelling. Kerlix Roll Sterile or Non-Sterile 6-ply 4.5x4 (yd/yd) Discharge Instruction: 3. Apply Kerlix as directed Compression Wrap Compression  Stockings Add-Ons Electronic Signature(s) Signed: 12/20/2022 2:41:28 PM By: Midge Aver MSN RN CNS WTA Signed: 12/21/2022 5:16:31 PM By: Betha Loa Entered By: Betha Loa on 12/20/2022 11:38:00 -------------------------------------------------------------------------------- Vitals Details Patient Name: Date of Service: [name]. 12/20/2022 10:45 A M Medical Record Number: [id number] Patient Account Number: [id number] Date of Birth/Sex: Treating RN: May 10, 1956 (67 y.o. [name] Primary Care Leith Hedlund: Elizabeth Sauer Other Clinician: Betha Loa Referring Teeghan Hammer: Treating Ahmaad Neidhardt/Extender: Althia Forts Weeks in Treatment: 33 Vital Signs Time Taken: 10:54 Temperature (F): 98.0 Weight (lbs): 245.4 Pulse (bpm): 74 Respiratory Rate (breaths/min): 18 Blood Pressure (mmHg): 147/76 Reference Range: 80 - 120 mg / dl Electronic Signature(s) Signed: 12/21/2022 5:16:31 PM By: Betha Loa Entered By: Betha Loa on 12/20/2022 10:55:47

## 2023-01-04 DIAGNOSIS — T148XXA Other injury of unspecified body region, initial encounter: Secondary | ICD-10-CM | POA: Diagnosis not present

## 2023-01-04 DIAGNOSIS — K5900 Constipation, unspecified: Secondary | ICD-10-CM | POA: Diagnosis not present

## 2023-01-04 DIAGNOSIS — K30 Functional dyspepsia: Secondary | ICD-10-CM | POA: Diagnosis not present

## 2023-01-09 DIAGNOSIS — G8929 Other chronic pain: Secondary | ICD-10-CM | POA: Diagnosis not present

## 2023-01-10 DIAGNOSIS — L299 Pruritus, unspecified: Secondary | ICD-10-CM | POA: Diagnosis not present

## 2023-01-11 DIAGNOSIS — F330 Major depressive disorder, recurrent, mild: Secondary | ICD-10-CM | POA: Diagnosis not present

## 2023-01-19 ENCOUNTER — Ambulatory Visit: Payer: Medicare HMO | Admitting: Internal Medicine

## 2023-01-31 DIAGNOSIS — L97212 Non-pressure chronic ulcer of right calf with fat layer exposed: Secondary | ICD-10-CM | POA: Diagnosis not present

## 2023-01-31 DIAGNOSIS — I87311 Chronic venous hypertension (idiopathic) with ulcer of right lower extremity: Secondary | ICD-10-CM | POA: Diagnosis not present

## 2023-01-31 DIAGNOSIS — E1169 Type 2 diabetes mellitus with other specified complication: Secondary | ICD-10-CM | POA: Diagnosis not present

## 2023-01-31 DIAGNOSIS — I739 Peripheral vascular disease, unspecified: Secondary | ICD-10-CM | POA: Diagnosis not present

## 2023-01-31 DIAGNOSIS — I87312 Chronic venous hypertension (idiopathic) with ulcer of left lower extremity: Secondary | ICD-10-CM | POA: Diagnosis not present

## 2023-01-31 DIAGNOSIS — L97222 Non-pressure chronic ulcer of left calf with fat layer exposed: Secondary | ICD-10-CM | POA: Diagnosis not present

## 2023-02-02 DIAGNOSIS — E559 Vitamin D deficiency, unspecified: Secondary | ICD-10-CM | POA: Diagnosis not present

## 2023-02-06 ENCOUNTER — Telehealth: Payer: Self-pay | Admitting: Family Medicine

## 2023-02-06 ENCOUNTER — Telehealth: Payer: Self-pay | Admitting: *Deleted

## 2023-02-06 DIAGNOSIS — E559 Vitamin D deficiency, unspecified: Secondary | ICD-10-CM | POA: Diagnosis not present

## 2023-02-06 DIAGNOSIS — E039 Hypothyroidism, unspecified: Secondary | ICD-10-CM | POA: Diagnosis not present

## 2023-02-06 NOTE — Telephone Encounter (Signed)
I attempted to contact patient by telephone but was unsuccessful. According to the patient's chart they are due for follow up  with [facility]. I have left a HIPAA compliant message advising the patient to contact [facility]. I will continue to follow up with the patient to make sure this appointment is scheduled.

## 2023-02-06 NOTE — Telephone Encounter (Signed)
Copied from CRM [id number]. Topic: Medicare AWV >> Feb 06, 2023 10:34 AM Payton Doughty wrote: Reason for CRM: Called 02/06/2023 to sched AWV - MAILBOX FULL   Verlee Rossetti; Care Guide Ambulatory Clinical Support [place] [facility] Direct Dial: [phone]

## 2023-02-10 DIAGNOSIS — E119 Type 2 diabetes mellitus without complications: Secondary | ICD-10-CM | POA: Diagnosis not present

## 2023-02-10 DIAGNOSIS — E039 Hypothyroidism, unspecified: Secondary | ICD-10-CM | POA: Diagnosis not present

## 2023-02-10 DIAGNOSIS — E1169 Type 2 diabetes mellitus with other specified complication: Secondary | ICD-10-CM | POA: Diagnosis not present

## 2023-02-11 DIAGNOSIS — I10 Essential (primary) hypertension: Secondary | ICD-10-CM | POA: Diagnosis not present

## 2023-02-13 DIAGNOSIS — R051 Acute cough: Secondary | ICD-10-CM | POA: Diagnosis not present

## 2023-02-14 DIAGNOSIS — I87311 Chronic venous hypertension (idiopathic) with ulcer of right lower extremity: Secondary | ICD-10-CM | POA: Diagnosis not present

## 2023-02-14 DIAGNOSIS — I739 Peripheral vascular disease, unspecified: Secondary | ICD-10-CM | POA: Diagnosis not present

## 2023-02-14 DIAGNOSIS — E039 Hypothyroidism, unspecified: Secondary | ICD-10-CM | POA: Diagnosis not present

## 2023-02-14 DIAGNOSIS — L97212 Non-pressure chronic ulcer of right calf with fat layer exposed: Secondary | ICD-10-CM | POA: Diagnosis not present

## 2023-02-14 DIAGNOSIS — I10 Essential (primary) hypertension: Secondary | ICD-10-CM | POA: Diagnosis not present

## 2023-02-14 DIAGNOSIS — U071 COVID-19: Secondary | ICD-10-CM | POA: Diagnosis not present

## 2023-02-14 DIAGNOSIS — T148XXA Other injury of unspecified body region, initial encounter: Secondary | ICD-10-CM | POA: Diagnosis not present

## 2023-02-14 DIAGNOSIS — E1169 Type 2 diabetes mellitus with other specified complication: Secondary | ICD-10-CM | POA: Diagnosis not present

## 2023-02-21 DIAGNOSIS — L97222 Non-pressure chronic ulcer of left calf with fat layer exposed: Secondary | ICD-10-CM | POA: Diagnosis not present

## 2023-02-21 DIAGNOSIS — E1169 Type 2 diabetes mellitus with other specified complication: Secondary | ICD-10-CM | POA: Diagnosis not present

## 2023-02-21 DIAGNOSIS — E119 Type 2 diabetes mellitus without complications: Secondary | ICD-10-CM | POA: Diagnosis not present

## 2023-02-21 DIAGNOSIS — I87312 Chronic venous hypertension (idiopathic) with ulcer of left lower extremity: Secondary | ICD-10-CM | POA: Diagnosis not present

## 2023-02-21 DIAGNOSIS — I739 Peripheral vascular disease, unspecified: Secondary | ICD-10-CM | POA: Diagnosis not present

## 2023-02-23 DIAGNOSIS — T148XXA Other injury of unspecified body region, initial encounter: Secondary | ICD-10-CM | POA: Diagnosis not present

## 2023-02-23 DIAGNOSIS — E039 Hypothyroidism, unspecified: Secondary | ICD-10-CM | POA: Diagnosis not present

## 2023-02-23 DIAGNOSIS — E559 Vitamin D deficiency, unspecified: Secondary | ICD-10-CM | POA: Diagnosis not present

## 2023-02-23 DIAGNOSIS — U071 COVID-19: Secondary | ICD-10-CM | POA: Diagnosis not present

## 2023-02-23 DIAGNOSIS — I509 Heart failure, unspecified: Secondary | ICD-10-CM | POA: Diagnosis not present

## 2023-02-28 DIAGNOSIS — M25562 Pain in left knee: Secondary | ICD-10-CM | POA: Diagnosis not present

## 2023-02-28 DIAGNOSIS — E1169 Type 2 diabetes mellitus with other specified complication: Secondary | ICD-10-CM | POA: Diagnosis not present

## 2023-02-28 DIAGNOSIS — L97222 Non-pressure chronic ulcer of left calf with fat layer exposed: Secondary | ICD-10-CM | POA: Diagnosis not present

## 2023-02-28 DIAGNOSIS — I739 Peripheral vascular disease, unspecified: Secondary | ICD-10-CM | POA: Diagnosis not present

## 2023-02-28 DIAGNOSIS — L97212 Non-pressure chronic ulcer of right calf with fat layer exposed: Secondary | ICD-10-CM | POA: Diagnosis not present

## 2023-02-28 DIAGNOSIS — M25561 Pain in right knee: Secondary | ICD-10-CM | POA: Diagnosis not present

## 2023-02-28 DIAGNOSIS — T148XXA Other injury of unspecified body region, initial encounter: Secondary | ICD-10-CM | POA: Diagnosis not present

## 2023-02-28 DIAGNOSIS — I87312 Chronic venous hypertension (idiopathic) with ulcer of left lower extremity: Secondary | ICD-10-CM | POA: Diagnosis not present

## 2023-02-28 DIAGNOSIS — I87311 Chronic venous hypertension (idiopathic) with ulcer of right lower extremity: Secondary | ICD-10-CM | POA: Diagnosis not present

## 2023-03-03 DIAGNOSIS — I87311 Chronic venous hypertension (idiopathic) with ulcer of right lower extremity: Secondary | ICD-10-CM | POA: Diagnosis not present

## 2023-03-08 DIAGNOSIS — F330 Major depressive disorder, recurrent, mild: Secondary | ICD-10-CM | POA: Diagnosis not present

## 2023-03-14 DIAGNOSIS — E1169 Type 2 diabetes mellitus with other specified complication: Secondary | ICD-10-CM | POA: Diagnosis not present

## 2023-03-14 DIAGNOSIS — I739 Peripheral vascular disease, unspecified: Secondary | ICD-10-CM | POA: Diagnosis not present

## 2023-03-14 DIAGNOSIS — L97212 Non-pressure chronic ulcer of right calf with fat layer exposed: Secondary | ICD-10-CM | POA: Diagnosis not present

## 2023-03-14 DIAGNOSIS — L97222 Non-pressure chronic ulcer of left calf with fat layer exposed: Secondary | ICD-10-CM | POA: Diagnosis not present

## 2023-03-14 DIAGNOSIS — I87311 Chronic venous hypertension (idiopathic) with ulcer of right lower extremity: Secondary | ICD-10-CM | POA: Diagnosis not present

## 2023-03-14 DIAGNOSIS — I87312 Chronic venous hypertension (idiopathic) with ulcer of left lower extremity: Secondary | ICD-10-CM | POA: Diagnosis not present

## 2023-03-16 DIAGNOSIS — G8929 Other chronic pain: Secondary | ICD-10-CM | POA: Diagnosis not present

## 2023-03-16 DIAGNOSIS — E119 Type 2 diabetes mellitus without complications: Secondary | ICD-10-CM | POA: Diagnosis not present

## 2023-03-16 DIAGNOSIS — H25813 Combined forms of age-related cataract, bilateral: Secondary | ICD-10-CM | POA: Diagnosis not present

## 2023-03-16 DIAGNOSIS — I87312 Chronic venous hypertension (idiopathic) with ulcer of left lower extremity: Secondary | ICD-10-CM | POA: Diagnosis not present

## 2023-03-16 DIAGNOSIS — I87311 Chronic venous hypertension (idiopathic) with ulcer of right lower extremity: Secondary | ICD-10-CM | POA: Diagnosis not present

## 2023-03-17 DIAGNOSIS — L97212 Non-pressure chronic ulcer of right calf with fat layer exposed: Secondary | ICD-10-CM | POA: Diagnosis not present

## 2023-03-17 DIAGNOSIS — L97222 Non-pressure chronic ulcer of left calf with fat layer exposed: Secondary | ICD-10-CM | POA: Diagnosis not present

## 2023-03-20 DIAGNOSIS — M25562 Pain in left knee: Secondary | ICD-10-CM | POA: Diagnosis not present

## 2023-03-20 DIAGNOSIS — M25561 Pain in right knee: Secondary | ICD-10-CM | POA: Diagnosis not present

## 2023-03-21 DIAGNOSIS — B372 Candidiasis of skin and nail: Secondary | ICD-10-CM | POA: Diagnosis not present

## 2023-03-24 DIAGNOSIS — F330 Major depressive disorder, recurrent, mild: Secondary | ICD-10-CM | POA: Diagnosis not present

## 2023-03-27 DIAGNOSIS — E559 Vitamin D deficiency, unspecified: Secondary | ICD-10-CM | POA: Diagnosis not present

## 2023-03-27 DIAGNOSIS — N183 Chronic kidney disease, stage 3 unspecified: Secondary | ICD-10-CM | POA: Diagnosis not present

## 2023-03-27 DIAGNOSIS — D509 Iron deficiency anemia, unspecified: Secondary | ICD-10-CM | POA: Diagnosis not present

## 2023-04-03 DIAGNOSIS — E039 Hypothyroidism, unspecified: Secondary | ICD-10-CM | POA: Diagnosis not present

## 2023-04-04 DIAGNOSIS — L97222 Non-pressure chronic ulcer of left calf with fat layer exposed: Secondary | ICD-10-CM | POA: Diagnosis not present

## 2023-04-04 DIAGNOSIS — E1169 Type 2 diabetes mellitus with other specified complication: Secondary | ICD-10-CM | POA: Diagnosis not present

## 2023-04-04 DIAGNOSIS — I739 Peripheral vascular disease, unspecified: Secondary | ICD-10-CM | POA: Diagnosis not present

## 2023-04-04 DIAGNOSIS — I87312 Chronic venous hypertension (idiopathic) with ulcer of left lower extremity: Secondary | ICD-10-CM | POA: Diagnosis not present

## 2023-04-04 DIAGNOSIS — L97212 Non-pressure chronic ulcer of right calf with fat layer exposed: Secondary | ICD-10-CM | POA: Diagnosis not present

## 2023-04-04 DIAGNOSIS — I87311 Chronic venous hypertension (idiopathic) with ulcer of right lower extremity: Secondary | ICD-10-CM | POA: Diagnosis not present

## 2023-04-05 DIAGNOSIS — F330 Major depressive disorder, recurrent, mild: Secondary | ICD-10-CM | POA: Diagnosis not present

## 2023-04-11 DIAGNOSIS — L97222 Non-pressure chronic ulcer of left calf with fat layer exposed: Secondary | ICD-10-CM | POA: Diagnosis not present

## 2023-04-11 DIAGNOSIS — I739 Peripheral vascular disease, unspecified: Secondary | ICD-10-CM | POA: Diagnosis not present

## 2023-04-11 DIAGNOSIS — I87311 Chronic venous hypertension (idiopathic) with ulcer of right lower extremity: Secondary | ICD-10-CM | POA: Diagnosis not present

## 2023-04-11 DIAGNOSIS — L97212 Non-pressure chronic ulcer of right calf with fat layer exposed: Secondary | ICD-10-CM | POA: Diagnosis not present

## 2023-04-11 DIAGNOSIS — I87312 Chronic venous hypertension (idiopathic) with ulcer of left lower extremity: Secondary | ICD-10-CM | POA: Diagnosis not present

## 2023-04-11 DIAGNOSIS — E1169 Type 2 diabetes mellitus with other specified complication: Secondary | ICD-10-CM | POA: Diagnosis not present

## 2023-04-18 DIAGNOSIS — L97212 Non-pressure chronic ulcer of right calf with fat layer exposed: Secondary | ICD-10-CM | POA: Diagnosis not present

## 2023-04-18 DIAGNOSIS — I87311 Chronic venous hypertension (idiopathic) with ulcer of right lower extremity: Secondary | ICD-10-CM | POA: Diagnosis not present

## 2023-04-18 DIAGNOSIS — I739 Peripheral vascular disease, unspecified: Secondary | ICD-10-CM | POA: Diagnosis not present

## 2023-04-18 DIAGNOSIS — E1169 Type 2 diabetes mellitus with other specified complication: Secondary | ICD-10-CM | POA: Diagnosis not present

## 2023-04-19 DIAGNOSIS — D509 Iron deficiency anemia, unspecified: Secondary | ICD-10-CM | POA: Diagnosis not present

## 2023-04-19 DIAGNOSIS — I10 Essential (primary) hypertension: Secondary | ICD-10-CM | POA: Diagnosis not present

## 2023-04-19 DIAGNOSIS — I509 Heart failure, unspecified: Secondary | ICD-10-CM | POA: Diagnosis not present

## 2023-04-19 DIAGNOSIS — I890 Lymphedema, not elsewhere classified: Secondary | ICD-10-CM | POA: Diagnosis not present

## 2023-04-19 DIAGNOSIS — G8929 Other chronic pain: Secondary | ICD-10-CM | POA: Diagnosis not present

## 2023-04-19 DIAGNOSIS — E039 Hypothyroidism, unspecified: Secondary | ICD-10-CM | POA: Diagnosis not present

## 2023-04-19 DIAGNOSIS — E559 Vitamin D deficiency, unspecified: Secondary | ICD-10-CM | POA: Diagnosis not present

## 2023-04-19 DIAGNOSIS — E119 Type 2 diabetes mellitus without complications: Secondary | ICD-10-CM | POA: Diagnosis not present

## 2023-04-24 DIAGNOSIS — L97212 Non-pressure chronic ulcer of right calf with fat layer exposed: Secondary | ICD-10-CM | POA: Diagnosis not present

## 2023-04-24 DIAGNOSIS — L97222 Non-pressure chronic ulcer of left calf with fat layer exposed: Secondary | ICD-10-CM | POA: Diagnosis not present

## 2023-04-25 DIAGNOSIS — L97212 Non-pressure chronic ulcer of right calf with fat layer exposed: Secondary | ICD-10-CM | POA: Diagnosis not present

## 2023-04-25 DIAGNOSIS — I87311 Chronic venous hypertension (idiopathic) with ulcer of right lower extremity: Secondary | ICD-10-CM | POA: Diagnosis not present

## 2023-04-25 DIAGNOSIS — I739 Peripheral vascular disease, unspecified: Secondary | ICD-10-CM | POA: Diagnosis not present

## 2023-04-25 DIAGNOSIS — E1169 Type 2 diabetes mellitus with other specified complication: Secondary | ICD-10-CM | POA: Diagnosis not present

## 2023-05-02 DIAGNOSIS — I739 Peripheral vascular disease, unspecified: Secondary | ICD-10-CM | POA: Diagnosis not present

## 2023-05-02 DIAGNOSIS — E1169 Type 2 diabetes mellitus with other specified complication: Secondary | ICD-10-CM | POA: Diagnosis not present

## 2023-05-02 DIAGNOSIS — L97212 Non-pressure chronic ulcer of right calf with fat layer exposed: Secondary | ICD-10-CM | POA: Diagnosis not present

## 2023-05-02 DIAGNOSIS — I87311 Chronic venous hypertension (idiopathic) with ulcer of right lower extremity: Secondary | ICD-10-CM | POA: Diagnosis not present

## 2023-05-05 DIAGNOSIS — N390 Urinary tract infection, site not specified: Secondary | ICD-10-CM | POA: Diagnosis not present

## 2023-05-08 DIAGNOSIS — N183 Chronic kidney disease, stage 3 unspecified: Secondary | ICD-10-CM | POA: Diagnosis not present

## 2023-05-08 DIAGNOSIS — N390 Urinary tract infection, site not specified: Secondary | ICD-10-CM | POA: Diagnosis not present

## 2023-05-08 DIAGNOSIS — R509 Fever, unspecified: Secondary | ICD-10-CM | POA: Diagnosis not present

## 2023-05-08 DIAGNOSIS — D72829 Elevated white blood cell count, unspecified: Secondary | ICD-10-CM | POA: Diagnosis not present

## 2023-05-09 DIAGNOSIS — I739 Peripheral vascular disease, unspecified: Secondary | ICD-10-CM | POA: Diagnosis not present

## 2023-05-09 DIAGNOSIS — R0989 Other specified symptoms and signs involving the circulatory and respiratory systems: Secondary | ICD-10-CM | POA: Diagnosis not present

## 2023-05-09 DIAGNOSIS — L97222 Non-pressure chronic ulcer of left calf with fat layer exposed: Secondary | ICD-10-CM | POA: Diagnosis not present

## 2023-05-09 DIAGNOSIS — I87312 Chronic venous hypertension (idiopathic) with ulcer of left lower extremity: Secondary | ICD-10-CM | POA: Diagnosis not present

## 2023-05-09 DIAGNOSIS — E1169 Type 2 diabetes mellitus with other specified complication: Secondary | ICD-10-CM | POA: Diagnosis not present

## 2023-05-10 DIAGNOSIS — D649 Anemia, unspecified: Secondary | ICD-10-CM | POA: Diagnosis not present

## 2023-05-11 DIAGNOSIS — Z22322 Carrier or suspected carrier of Methicillin resistant Staphylococcus aureus: Secondary | ICD-10-CM | POA: Diagnosis not present

## 2023-05-11 DIAGNOSIS — R509 Fever, unspecified: Secondary | ICD-10-CM | POA: Diagnosis not present

## 2023-05-11 DIAGNOSIS — D72829 Elevated white blood cell count, unspecified: Secondary | ICD-10-CM | POA: Diagnosis not present

## 2023-05-11 DIAGNOSIS — N390 Urinary tract infection, site not specified: Secondary | ICD-10-CM | POA: Diagnosis not present

## 2023-05-11 DIAGNOSIS — N183 Chronic kidney disease, stage 3 unspecified: Secondary | ICD-10-CM | POA: Diagnosis not present

## 2023-05-15 DIAGNOSIS — T148XXA Other injury of unspecified body region, initial encounter: Secondary | ICD-10-CM | POA: Diagnosis not present

## 2023-05-15 DIAGNOSIS — R509 Fever, unspecified: Secondary | ICD-10-CM | POA: Diagnosis not present

## 2023-05-15 DIAGNOSIS — D72829 Elevated white blood cell count, unspecified: Secondary | ICD-10-CM | POA: Diagnosis not present

## 2023-05-15 DIAGNOSIS — Z22322 Carrier or suspected carrier of Methicillin resistant Staphylococcus aureus: Secondary | ICD-10-CM | POA: Diagnosis not present

## 2023-05-15 DIAGNOSIS — A498 Other bacterial infections of unspecified site: Secondary | ICD-10-CM | POA: Diagnosis not present

## 2023-05-16 DIAGNOSIS — I87311 Chronic venous hypertension (idiopathic) with ulcer of right lower extremity: Secondary | ICD-10-CM | POA: Diagnosis not present

## 2023-05-16 DIAGNOSIS — L97212 Non-pressure chronic ulcer of right calf with fat layer exposed: Secondary | ICD-10-CM | POA: Diagnosis not present

## 2023-05-16 DIAGNOSIS — E1169 Type 2 diabetes mellitus with other specified complication: Secondary | ICD-10-CM | POA: Diagnosis not present

## 2023-05-16 DIAGNOSIS — I739 Peripheral vascular disease, unspecified: Secondary | ICD-10-CM | POA: Diagnosis not present

## 2023-05-22 DIAGNOSIS — E039 Hypothyroidism, unspecified: Secondary | ICD-10-CM | POA: Diagnosis not present

## 2023-05-22 DIAGNOSIS — Z4801 Encounter for change or removal of surgical wound dressing: Secondary | ICD-10-CM | POA: Diagnosis not present

## 2023-05-22 DIAGNOSIS — G8929 Other chronic pain: Secondary | ICD-10-CM | POA: Diagnosis not present

## 2023-05-23 DIAGNOSIS — I739 Peripheral vascular disease, unspecified: Secondary | ICD-10-CM | POA: Diagnosis not present

## 2023-05-23 DIAGNOSIS — E0590 Thyrotoxicosis, unspecified without thyrotoxic crisis or storm: Secondary | ICD-10-CM | POA: Diagnosis not present

## 2023-05-23 DIAGNOSIS — I87311 Chronic venous hypertension (idiopathic) with ulcer of right lower extremity: Secondary | ICD-10-CM | POA: Diagnosis not present

## 2023-05-23 DIAGNOSIS — E1169 Type 2 diabetes mellitus with other specified complication: Secondary | ICD-10-CM | POA: Diagnosis not present

## 2023-05-23 DIAGNOSIS — L97212 Non-pressure chronic ulcer of right calf with fat layer exposed: Secondary | ICD-10-CM | POA: Diagnosis not present

## 2023-05-30 DIAGNOSIS — L97222 Non-pressure chronic ulcer of left calf with fat layer exposed: Secondary | ICD-10-CM | POA: Diagnosis not present

## 2023-05-30 DIAGNOSIS — I87311 Chronic venous hypertension (idiopathic) with ulcer of right lower extremity: Secondary | ICD-10-CM | POA: Diagnosis not present

## 2023-05-30 DIAGNOSIS — I739 Peripheral vascular disease, unspecified: Secondary | ICD-10-CM | POA: Diagnosis not present

## 2023-05-30 DIAGNOSIS — E1169 Type 2 diabetes mellitus with other specified complication: Secondary | ICD-10-CM | POA: Diagnosis not present

## 2023-05-30 DIAGNOSIS — L97212 Non-pressure chronic ulcer of right calf with fat layer exposed: Secondary | ICD-10-CM | POA: Diagnosis not present

## 2023-05-30 DIAGNOSIS — I87312 Chronic venous hypertension (idiopathic) with ulcer of left lower extremity: Secondary | ICD-10-CM | POA: Diagnosis not present

## 2023-06-06 DIAGNOSIS — I87311 Chronic venous hypertension (idiopathic) with ulcer of right lower extremity: Secondary | ICD-10-CM | POA: Diagnosis not present

## 2023-06-06 DIAGNOSIS — E1169 Type 2 diabetes mellitus with other specified complication: Secondary | ICD-10-CM | POA: Diagnosis not present

## 2023-06-06 DIAGNOSIS — I739 Peripheral vascular disease, unspecified: Secondary | ICD-10-CM | POA: Diagnosis not present

## 2023-06-06 DIAGNOSIS — L97212 Non-pressure chronic ulcer of right calf with fat layer exposed: Secondary | ICD-10-CM | POA: Diagnosis not present

## 2023-06-13 DIAGNOSIS — L97212 Non-pressure chronic ulcer of right calf with fat layer exposed: Secondary | ICD-10-CM | POA: Diagnosis not present

## 2023-06-13 DIAGNOSIS — I739 Peripheral vascular disease, unspecified: Secondary | ICD-10-CM | POA: Diagnosis not present

## 2023-06-13 DIAGNOSIS — E1169 Type 2 diabetes mellitus with other specified complication: Secondary | ICD-10-CM | POA: Diagnosis not present

## 2023-06-13 DIAGNOSIS — I87311 Chronic venous hypertension (idiopathic) with ulcer of right lower extremity: Secondary | ICD-10-CM | POA: Diagnosis not present

## 2023-06-14 DIAGNOSIS — F330 Major depressive disorder, recurrent, mild: Secondary | ICD-10-CM | POA: Diagnosis not present

## 2023-06-15 DIAGNOSIS — L299 Pruritus, unspecified: Secondary | ICD-10-CM | POA: Diagnosis not present

## 2023-06-16 DIAGNOSIS — E039 Hypothyroidism, unspecified: Secondary | ICD-10-CM | POA: Diagnosis not present

## 2023-06-22 DIAGNOSIS — I10 Essential (primary) hypertension: Secondary | ICD-10-CM | POA: Diagnosis not present

## 2023-06-26 DIAGNOSIS — Z22322 Carrier or suspected carrier of Methicillin resistant Staphylococcus aureus: Secondary | ICD-10-CM | POA: Diagnosis not present

## 2023-06-26 DIAGNOSIS — E039 Hypothyroidism, unspecified: Secondary | ICD-10-CM | POA: Diagnosis not present

## 2023-06-26 DIAGNOSIS — A498 Other bacterial infections of unspecified site: Secondary | ICD-10-CM | POA: Diagnosis not present

## 2023-06-27 DIAGNOSIS — I739 Peripheral vascular disease, unspecified: Secondary | ICD-10-CM | POA: Diagnosis not present

## 2023-06-27 DIAGNOSIS — I87312 Chronic venous hypertension (idiopathic) with ulcer of left lower extremity: Secondary | ICD-10-CM | POA: Diagnosis not present

## 2023-06-27 DIAGNOSIS — L97222 Non-pressure chronic ulcer of left calf with fat layer exposed: Secondary | ICD-10-CM | POA: Diagnosis not present

## 2023-06-27 DIAGNOSIS — E1169 Type 2 diabetes mellitus with other specified complication: Secondary | ICD-10-CM | POA: Diagnosis not present

## 2023-07-04 DIAGNOSIS — I87311 Chronic venous hypertension (idiopathic) with ulcer of right lower extremity: Secondary | ICD-10-CM | POA: Diagnosis not present

## 2023-07-04 DIAGNOSIS — I739 Peripheral vascular disease, unspecified: Secondary | ICD-10-CM | POA: Diagnosis not present

## 2023-07-04 DIAGNOSIS — L97212 Non-pressure chronic ulcer of right calf with fat layer exposed: Secondary | ICD-10-CM | POA: Diagnosis not present

## 2023-07-04 DIAGNOSIS — E1169 Type 2 diabetes mellitus with other specified complication: Secondary | ICD-10-CM | POA: Diagnosis not present

## 2023-07-06 DIAGNOSIS — N183 Chronic kidney disease, stage 3 unspecified: Secondary | ICD-10-CM | POA: Diagnosis not present

## 2023-07-06 DIAGNOSIS — I509 Heart failure, unspecified: Secondary | ICD-10-CM | POA: Diagnosis not present

## 2023-07-06 DIAGNOSIS — D509 Iron deficiency anemia, unspecified: Secondary | ICD-10-CM | POA: Diagnosis not present

## 2023-07-06 DIAGNOSIS — E119 Type 2 diabetes mellitus without complications: Secondary | ICD-10-CM | POA: Diagnosis not present

## 2023-07-07 DIAGNOSIS — F330 Major depressive disorder, recurrent, mild: Secondary | ICD-10-CM | POA: Diagnosis not present

## 2023-07-11 DIAGNOSIS — E1169 Type 2 diabetes mellitus with other specified complication: Secondary | ICD-10-CM | POA: Diagnosis not present

## 2023-07-11 DIAGNOSIS — I87312 Chronic venous hypertension (idiopathic) with ulcer of left lower extremity: Secondary | ICD-10-CM | POA: Diagnosis not present

## 2023-07-11 DIAGNOSIS — L97222 Non-pressure chronic ulcer of left calf with fat layer exposed: Secondary | ICD-10-CM | POA: Diagnosis not present

## 2023-07-11 DIAGNOSIS — I739 Peripheral vascular disease, unspecified: Secondary | ICD-10-CM | POA: Diagnosis not present

## 2023-07-14 DIAGNOSIS — L97222 Non-pressure chronic ulcer of left calf with fat layer exposed: Secondary | ICD-10-CM | POA: Diagnosis not present

## 2023-07-14 DIAGNOSIS — L97212 Non-pressure chronic ulcer of right calf with fat layer exposed: Secondary | ICD-10-CM | POA: Diagnosis not present

## 2023-07-18 ENCOUNTER — Encounter (INDEPENDENT_AMBULATORY_CARE_PROVIDER_SITE_OTHER): Payer: Self-pay | Admitting: Nurse Practitioner

## 2023-07-18 DIAGNOSIS — I739 Peripheral vascular disease, unspecified: Secondary | ICD-10-CM | POA: Diagnosis not present

## 2023-07-18 DIAGNOSIS — L97212 Non-pressure chronic ulcer of right calf with fat layer exposed: Secondary | ICD-10-CM | POA: Diagnosis not present

## 2023-07-18 DIAGNOSIS — I87311 Chronic venous hypertension (idiopathic) with ulcer of right lower extremity: Secondary | ICD-10-CM | POA: Diagnosis not present

## 2023-07-18 DIAGNOSIS — E1169 Type 2 diabetes mellitus with other specified complication: Secondary | ICD-10-CM | POA: Diagnosis not present

## 2023-07-20 DIAGNOSIS — Z7189 Other specified counseling: Secondary | ICD-10-CM | POA: Diagnosis not present

## 2023-07-20 DIAGNOSIS — G8929 Other chronic pain: Secondary | ICD-10-CM | POA: Diagnosis not present

## 2023-07-20 DIAGNOSIS — T148XXA Other injury of unspecified body region, initial encounter: Secondary | ICD-10-CM | POA: Diagnosis not present

## 2023-08-01 ENCOUNTER — Encounter (INDEPENDENT_AMBULATORY_CARE_PROVIDER_SITE_OTHER): Payer: Self-pay | Admitting: Nurse Practitioner

## 2023-08-02 DIAGNOSIS — R112 Nausea with vomiting, unspecified: Secondary | ICD-10-CM | POA: Diagnosis not present

## 2023-08-02 DIAGNOSIS — G8929 Other chronic pain: Secondary | ICD-10-CM | POA: Diagnosis not present

## 2023-08-08 DIAGNOSIS — I87311 Chronic venous hypertension (idiopathic) with ulcer of right lower extremity: Secondary | ICD-10-CM | POA: Diagnosis not present

## 2023-08-08 DIAGNOSIS — I739 Peripheral vascular disease, unspecified: Secondary | ICD-10-CM | POA: Diagnosis not present

## 2023-08-08 DIAGNOSIS — L97212 Non-pressure chronic ulcer of right calf with fat layer exposed: Secondary | ICD-10-CM | POA: Diagnosis not present

## 2023-08-08 DIAGNOSIS — E1169 Type 2 diabetes mellitus with other specified complication: Secondary | ICD-10-CM | POA: Diagnosis not present

## 2023-08-15 DIAGNOSIS — E1169 Type 2 diabetes mellitus with other specified complication: Secondary | ICD-10-CM | POA: Diagnosis not present

## 2023-08-15 DIAGNOSIS — I87312 Chronic venous hypertension (idiopathic) with ulcer of left lower extremity: Secondary | ICD-10-CM | POA: Diagnosis not present

## 2023-08-15 DIAGNOSIS — L97222 Non-pressure chronic ulcer of left calf with fat layer exposed: Secondary | ICD-10-CM | POA: Diagnosis not present

## 2023-08-15 DIAGNOSIS — I739 Peripheral vascular disease, unspecified: Secondary | ICD-10-CM | POA: Diagnosis not present

## 2023-08-23 DIAGNOSIS — F330 Major depressive disorder, recurrent, mild: Secondary | ICD-10-CM | POA: Diagnosis not present

## 2023-08-23 DIAGNOSIS — I10 Essential (primary) hypertension: Secondary | ICD-10-CM | POA: Diagnosis not present

## 2023-08-25 DIAGNOSIS — Z794 Long term (current) use of insulin: Secondary | ICD-10-CM | POA: Diagnosis not present

## 2023-08-25 DIAGNOSIS — L602 Onychogryphosis: Secondary | ICD-10-CM | POA: Diagnosis not present

## 2023-08-25 DIAGNOSIS — E1151 Type 2 diabetes mellitus with diabetic peripheral angiopathy without gangrene: Secondary | ICD-10-CM | POA: Diagnosis not present

## 2023-08-28 DIAGNOSIS — T148XXA Other injury of unspecified body region, initial encounter: Secondary | ICD-10-CM | POA: Diagnosis not present

## 2023-08-28 DIAGNOSIS — I739 Peripheral vascular disease, unspecified: Secondary | ICD-10-CM | POA: Diagnosis not present

## 2023-08-28 DIAGNOSIS — G8929 Other chronic pain: Secondary | ICD-10-CM | POA: Diagnosis not present

## 2023-08-28 DIAGNOSIS — Z22322 Carrier or suspected carrier of Methicillin resistant Staphylococcus aureus: Secondary | ICD-10-CM | POA: Diagnosis not present

## 2023-08-29 DIAGNOSIS — E559 Vitamin D deficiency, unspecified: Secondary | ICD-10-CM | POA: Diagnosis not present

## 2023-09-04 ENCOUNTER — Other Ambulatory Visit: Payer: Self-pay

## 2023-09-04 ENCOUNTER — Inpatient Hospital Stay
Admission: EM | Admit: 2023-09-04 | Discharge: 2023-09-15 | DRG: 638 | Disposition: A | Discharge status: Skilled Nursing Facility | Source: Skilled Nursing Facility | Attending: Internal Medicine | Admitting: Internal Medicine

## 2023-09-04 DIAGNOSIS — E1165 Type 2 diabetes mellitus with hyperglycemia: Secondary | ICD-10-CM | POA: Diagnosis present

## 2023-09-04 DIAGNOSIS — Z22322 Carrier or suspected carrier of Methicillin resistant Staphylococcus aureus: Secondary | ICD-10-CM | POA: Diagnosis not present

## 2023-09-04 DIAGNOSIS — E1122 Type 2 diabetes mellitus with diabetic chronic kidney disease: Secondary | ICD-10-CM | POA: Diagnosis present

## 2023-09-04 DIAGNOSIS — Z7902 Long term (current) use of antithrombotics/antiplatelets: Secondary | ICD-10-CM | POA: Diagnosis not present

## 2023-09-04 DIAGNOSIS — I739 Peripheral vascular disease, unspecified: Secondary | ICD-10-CM

## 2023-09-04 DIAGNOSIS — I5022 Chronic systolic (congestive) heart failure: Secondary | ICD-10-CM | POA: Diagnosis not present

## 2023-09-04 DIAGNOSIS — E611 Iron deficiency: Secondary | ICD-10-CM | POA: Diagnosis present

## 2023-09-04 DIAGNOSIS — S81802A Unspecified open wound, left lower leg, initial encounter: Secondary | ICD-10-CM | POA: Diagnosis present

## 2023-09-04 DIAGNOSIS — N1831 Chronic kidney disease, stage 3a: Secondary | ICD-10-CM | POA: Diagnosis not present

## 2023-09-04 DIAGNOSIS — Z79890 Hormone replacement therapy (postmenopausal): Secondary | ICD-10-CM | POA: Diagnosis not present

## 2023-09-04 DIAGNOSIS — I872 Venous insufficiency (chronic) (peripheral): Secondary | ICD-10-CM | POA: Diagnosis not present

## 2023-09-04 DIAGNOSIS — E669 Obesity, unspecified: Secondary | ICD-10-CM | POA: Diagnosis not present

## 2023-09-04 DIAGNOSIS — I83029 Varicose veins of left lower extremity with ulcer of unspecified site: Secondary | ICD-10-CM | POA: Diagnosis not present

## 2023-09-04 DIAGNOSIS — Z79899 Other long term (current) drug therapy: Secondary | ICD-10-CM

## 2023-09-04 DIAGNOSIS — L97929 Non-pressure chronic ulcer of unspecified part of left lower leg with unspecified severity: Secondary | ICD-10-CM | POA: Diagnosis present

## 2023-09-04 DIAGNOSIS — S81801A Unspecified open wound, right lower leg, initial encounter: Secondary | ICD-10-CM | POA: Diagnosis not present

## 2023-09-04 DIAGNOSIS — I70248 Atherosclerosis of native arteries of left leg with ulceration of other part of lower left leg: Secondary | ICD-10-CM | POA: Diagnosis not present

## 2023-09-04 DIAGNOSIS — E039 Hypothyroidism, unspecified: Secondary | ICD-10-CM

## 2023-09-04 DIAGNOSIS — M869 Osteomyelitis, unspecified: Secondary | ICD-10-CM

## 2023-09-04 DIAGNOSIS — D631 Anemia in chronic kidney disease: Secondary | ICD-10-CM | POA: Diagnosis not present

## 2023-09-04 DIAGNOSIS — L97226 Non-pressure chronic ulcer of left calf with bone involvement without evidence of necrosis: Secondary | ICD-10-CM | POA: Diagnosis not present

## 2023-09-04 DIAGNOSIS — Z7401 Bed confinement status: Secondary | ICD-10-CM | POA: Diagnosis not present

## 2023-09-04 DIAGNOSIS — M868X6 Other osteomyelitis, lower leg: Secondary | ICD-10-CM | POA: Diagnosis not present

## 2023-09-04 DIAGNOSIS — L97919 Non-pressure chronic ulcer of unspecified part of right lower leg with unspecified severity: Secondary | ICD-10-CM | POA: Diagnosis present

## 2023-09-04 DIAGNOSIS — R600 Localized edema: Secondary | ICD-10-CM | POA: Diagnosis not present

## 2023-09-04 DIAGNOSIS — D638 Anemia in other chronic diseases classified elsewhere: Secondary | ICD-10-CM | POA: Diagnosis not present

## 2023-09-04 DIAGNOSIS — M86162 Other acute osteomyelitis, left tibia and fibula: Secondary | ICD-10-CM | POA: Diagnosis not present

## 2023-09-04 DIAGNOSIS — L089 Local infection of the skin and subcutaneous tissue, unspecified: Secondary | ICD-10-CM | POA: Diagnosis not present

## 2023-09-04 DIAGNOSIS — N179 Acute kidney failure, unspecified: Secondary | ICD-10-CM | POA: Diagnosis present

## 2023-09-04 DIAGNOSIS — D75839 Thrombocytosis, unspecified: Secondary | ICD-10-CM | POA: Diagnosis not present

## 2023-09-04 DIAGNOSIS — E1169 Type 2 diabetes mellitus with other specified complication: Principal | ICD-10-CM | POA: Diagnosis present

## 2023-09-04 DIAGNOSIS — Z6841 Body Mass Index (BMI) 40.0 and over, adult: Secondary | ICD-10-CM | POA: Diagnosis not present

## 2023-09-04 DIAGNOSIS — Z87442 Personal history of urinary calculi: Secondary | ICD-10-CM

## 2023-09-04 DIAGNOSIS — T148XXA Other injury of unspecified body region, initial encounter: Secondary | ICD-10-CM

## 2023-09-04 DIAGNOSIS — M79605 Pain in left leg: Secondary | ICD-10-CM | POA: Diagnosis not present

## 2023-09-04 DIAGNOSIS — I87333 Chronic venous hypertension (idiopathic) with ulcer and inflammation of bilateral lower extremity: Secondary | ICD-10-CM | POA: Diagnosis not present

## 2023-09-04 DIAGNOSIS — E1151 Type 2 diabetes mellitus with diabetic peripheral angiopathy without gangrene: Secondary | ICD-10-CM | POA: Diagnosis not present

## 2023-09-04 DIAGNOSIS — E66813 Obesity, class 3: Secondary | ICD-10-CM | POA: Diagnosis present

## 2023-09-04 DIAGNOSIS — Z96641 Presence of right artificial hip joint: Secondary | ICD-10-CM | POA: Diagnosis present

## 2023-09-04 DIAGNOSIS — Z604 Social exclusion and rejection: Secondary | ICD-10-CM | POA: Diagnosis present

## 2023-09-04 DIAGNOSIS — I10 Essential (primary) hypertension: Secondary | ICD-10-CM | POA: Diagnosis not present

## 2023-09-04 DIAGNOSIS — Z794 Long term (current) use of insulin: Secondary | ICD-10-CM | POA: Diagnosis not present

## 2023-09-04 DIAGNOSIS — I878 Other specified disorders of veins: Secondary | ICD-10-CM | POA: Diagnosis not present

## 2023-09-04 DIAGNOSIS — I959 Hypotension, unspecified: Secondary | ICD-10-CM | POA: Diagnosis not present

## 2023-09-04 DIAGNOSIS — M79604 Pain in right leg: Secondary | ICD-10-CM | POA: Diagnosis not present

## 2023-09-04 DIAGNOSIS — M86262 Subacute osteomyelitis, left tibia and fibula: Secondary | ICD-10-CM | POA: Diagnosis not present

## 2023-09-04 DIAGNOSIS — I83019 Varicose veins of right lower extremity with ulcer of unspecified site: Secondary | ICD-10-CM | POA: Diagnosis present

## 2023-09-04 DIAGNOSIS — E1129 Type 2 diabetes mellitus with other diabetic kidney complication: Secondary | ICD-10-CM | POA: Diagnosis present

## 2023-09-04 DIAGNOSIS — E1121 Type 2 diabetes mellitus with diabetic nephropathy: Secondary | ICD-10-CM | POA: Diagnosis not present

## 2023-09-04 DIAGNOSIS — I130 Hypertensive heart and chronic kidney disease with heart failure and stage 1 through stage 4 chronic kidney disease, or unspecified chronic kidney disease: Secondary | ICD-10-CM | POA: Diagnosis not present

## 2023-09-04 DIAGNOSIS — S81809A Unspecified open wound, unspecified lower leg, initial encounter: Principal | ICD-10-CM

## 2023-09-04 DIAGNOSIS — L97909 Non-pressure chronic ulcer of unspecified part of unspecified lower leg with unspecified severity: Secondary | ICD-10-CM | POA: Diagnosis not present

## 2023-09-04 DIAGNOSIS — N189 Chronic kidney disease, unspecified: Secondary | ICD-10-CM | POA: Diagnosis not present

## 2023-09-04 DIAGNOSIS — Z89421 Acquired absence of other right toe(s): Secondary | ICD-10-CM

## 2023-09-04 DIAGNOSIS — M79662 Pain in left lower leg: Secondary | ICD-10-CM | POA: Diagnosis not present

## 2023-09-04 DIAGNOSIS — Z833 Family history of diabetes mellitus: Secondary | ICD-10-CM

## 2023-09-04 DIAGNOSIS — G8929 Other chronic pain: Secondary | ICD-10-CM | POA: Diagnosis not present

## 2023-09-04 DIAGNOSIS — M6281 Muscle weakness (generalized): Secondary | ICD-10-CM | POA: Diagnosis not present

## 2023-09-04 DIAGNOSIS — M79606 Pain in leg, unspecified: Secondary | ICD-10-CM | POA: Diagnosis not present

## 2023-09-04 LAB — CBC
HCT: 34.7 % — ABNORMAL LOW (ref 36.0–46.0)
Hemoglobin: 10.9 g/dL — ABNORMAL LOW (ref 12.0–15.0)
MCH: 27.5 pg (ref 26.0–34.0)
MCHC: 31.4 g/dL (ref 30.0–36.0)
MCV: 87.4 fL (ref 80.0–100.0)
Platelets: 566 10*3/uL — ABNORMAL HIGH (ref 150–400)
RBC: 3.97 MIL/uL (ref 3.87–5.11)
RDW: 14.8 % (ref 11.5–15.5)
WBC: 10.1 10*3/uL (ref 4.0–10.5)
nRBC: 0 % (ref 0.0–0.2)

## 2023-09-04 LAB — BASIC METABOLIC PANEL
Anion gap: 12 (ref 5–15)
BUN: 19 mg/dL (ref 8–23)
CO2: 25 mmol/L (ref 22–32)
Calcium: 8.7 mg/dL — ABNORMAL LOW (ref 8.9–10.3)
Chloride: 100 mmol/L (ref 98–111)
Creatinine, Ser: 1.47 mg/dL — ABNORMAL HIGH (ref 0.44–1.00)
GFR, Estimated: 38 mL/min — ABNORMAL LOW (ref >60)
Glucose, Bld: 167 mg/dL — ABNORMAL HIGH (ref 70–99)
Potassium: 4.4 mmol/L (ref 3.5–5.1)
Sodium: 137 mmol/L (ref 135–145)

## 2023-09-04 LAB — LACTIC ACID, PLASMA: Lactic Acid, Venous: 1.2 mmol/L (ref 0.5–1.9)

## 2023-09-04 LAB — CBG MONITORING, ED: Glucose-Capillary: 199 mg/dL — ABNORMAL HIGH (ref 70–99)

## 2023-09-04 LAB — SEDIMENTATION RATE: Sed Rate: >140 mm/h — ABNORMAL HIGH (ref 0–30)

## 2023-09-04 LAB — BRAIN NATRIURETIC PEPTIDE: B Natriuretic Peptide: 83.9 pg/mL (ref 0.0–100.0)

## 2023-09-04 MED ORDER — INSULIN ASPART 100 UNIT/ML IJ SOLN
0.0000 [IU] | Freq: Three times a day (TID) | INTRAMUSCULAR | Status: DC
  Administered 2023-09-05: 1 [IU] via SUBCUTANEOUS
  Administered 2023-09-05: 2 [IU] via SUBCUTANEOUS
  Administered 2023-09-06 (×2): 3 [IU] via SUBCUTANEOUS
  Administered 2023-09-06 – 2023-09-07 (×2): 1 [IU] via SUBCUTANEOUS
  Administered 2023-09-07: 2 [IU] via SUBCUTANEOUS
  Administered 2023-09-07: 1 [IU] via SUBCUTANEOUS
  Administered 2023-09-08 (×2): 2 [IU] via SUBCUTANEOUS
  Administered 2023-09-09: 3 [IU] via SUBCUTANEOUS
  Administered 2023-09-09 – 2023-09-10 (×4): 2 [IU] via SUBCUTANEOUS
  Administered 2023-09-11: 3 [IU] via SUBCUTANEOUS
  Administered 2023-09-11 – 2023-09-12 (×3): 2 [IU] via SUBCUTANEOUS
  Administered 2023-09-12: 3 [IU] via SUBCUTANEOUS
  Administered 2023-09-12 – 2023-09-13 (×2): 2 [IU] via SUBCUTANEOUS
  Administered 2023-09-13: 3 [IU] via SUBCUTANEOUS
  Administered 2023-09-13: 2 [IU] via SUBCUTANEOUS
  Administered 2023-09-14: 1 [IU] via SUBCUTANEOUS
  Administered 2023-09-15: 2 [IU] via SUBCUTANEOUS
  Filled 2023-09-04 (×27): qty 1

## 2023-09-04 MED ORDER — ENOXAPARIN SODIUM 30 MG/0.3ML IJ SOSY: 30.0000 mg | PREFILLED_SYRINGE | INTRAMUSCULAR | Status: DC

## 2023-09-04 MED ORDER — HYDROXYZINE HCL 10 MG PO TABS
10.0000 mg | ORAL_TABLET | Freq: Three times a day (TID) | ORAL | Status: DC | PRN
  Administered 2023-09-05: 10 mg via ORAL
  Filled 2023-09-04 (×2): qty 1

## 2023-09-04 MED ORDER — VANCOMYCIN HCL 1500 MG/300ML IV SOLN
1500.0000 mg | Freq: Once | INTRAVENOUS | Status: AC
  Administered 2023-09-04: 1500 mg via INTRAVENOUS
  Filled 2023-09-04: qty 300

## 2023-09-04 MED ORDER — METOPROLOL SUCCINATE ER 25 MG PO TB24
12.5000 mg | ORAL_TABLET | Freq: Every day | ORAL | Status: DC
  Administered 2023-09-05 – 2023-09-15 (×11): 12.5 mg via ORAL
  Filled 2023-09-04 (×11): qty 1

## 2023-09-04 MED ORDER — FENTANYL CITRATE PF 50 MCG/ML IJ SOSY
PREFILLED_SYRINGE | INTRAMUSCULAR | Status: AC
  Administered 2023-09-04: 50 ug via INTRAVENOUS
  Filled 2023-09-04: qty 1

## 2023-09-04 MED ORDER — SODIUM CHLORIDE 0.9 % IV SOLN
2.0000 g | Freq: Once | INTRAVENOUS | Status: AC
  Administered 2023-09-04: 2 g via INTRAVENOUS
  Filled 2023-09-04: qty 12.5

## 2023-09-04 MED ORDER — VANCOMYCIN HCL IN DEXTROSE 1-5 GM/200ML-% IV SOLN
1000.0000 mg | Freq: Once | INTRAVENOUS | Status: AC
  Administered 2023-09-04: 1000 mg via INTRAVENOUS
  Filled 2023-09-04: qty 200

## 2023-09-04 MED ORDER — INSULIN ASPART 100 UNIT/ML IJ SOLN
0.0000 [IU] | Freq: Every day | INTRAMUSCULAR | Status: DC
  Administered 2023-09-08: 3 [IU] via SUBCUTANEOUS
  Administered 2023-09-09: 2 [IU] via SUBCUTANEOUS
  Administered 2023-09-10: 3 [IU] via SUBCUTANEOUS
  Administered 2023-09-11: 4 [IU] via SUBCUTANEOUS
  Administered 2023-09-12: 2 [IU] via SUBCUTANEOUS
  Administered 2023-09-13: 3 [IU] via SUBCUTANEOUS
  Filled 2023-09-04 (×6): qty 1

## 2023-09-04 MED ORDER — FENTANYL CITRATE PF 50 MCG/ML IJ SOSY
50.0000 ug | PREFILLED_SYRINGE | Freq: Once | INTRAMUSCULAR | Status: AC
Start: 2023-09-04 — End: 2023-09-04
  Administered 2023-09-04: 50 ug via INTRAVENOUS
  Filled 2023-09-04: qty 1

## 2023-09-04 MED ORDER — AMLODIPINE BESYLATE 5 MG PO TABS
5.0000 mg | ORAL_TABLET | Freq: Every day | ORAL | Status: DC
  Administered 2023-09-05 – 2023-09-15 (×11): 5 mg via ORAL
  Filled 2023-09-04 (×11): qty 1

## 2023-09-04 MED ORDER — SENNOSIDES-DOCUSATE SODIUM 8.6-50 MG PO TABS: 2.0000 | ORAL_TABLET | Freq: Every evening | ORAL | Status: DC | PRN

## 2023-09-04 MED ORDER — ADULT MULTIVITAMIN W/MINERALS CH
1.0000 | ORAL_TABLET | Freq: Every day | ORAL | Status: DC
  Administered 2023-09-05 – 2023-09-15 (×11): 1 via ORAL
  Filled 2023-09-04 (×11): qty 1

## 2023-09-04 MED ORDER — VANCOMYCIN HCL 2000 MG/400ML IV SOLN: 2000.0000 mg | Freq: Once | INTRAVENOUS | Status: DC

## 2023-09-04 MED ORDER — VITAMIN C 500 MG PO TABS
500.0000 mg | ORAL_TABLET | Freq: Every day | ORAL | Status: DC
  Administered 2023-09-05 – 2023-09-15 (×11): 500 mg via ORAL
  Filled 2023-09-04 (×12): qty 1

## 2023-09-04 MED ORDER — ONDANSETRON HCL 4 MG/2ML IJ SOLN: 4.0000 mg | Freq: Three times a day (TID) | INTRAMUSCULAR | Status: DC | PRN

## 2023-09-04 MED ORDER — CLOPIDOGREL BISULFATE 75 MG PO TABS
75.0000 mg | ORAL_TABLET | Freq: Every day | ORAL | Status: DC
  Administered 2023-09-05 – 2023-09-15 (×11): 75 mg via ORAL
  Filled 2023-09-04 (×11): qty 1

## 2023-09-04 MED ORDER — FUROSEMIDE 20 MG PO TABS
20.0000 mg | ORAL_TABLET | Freq: Two times a day (BID) | ORAL | Status: DC
  Administered 2023-09-04 – 2023-09-05 (×2): 20 mg via ORAL
  Filled 2023-09-04 (×2): qty 1

## 2023-09-04 MED ORDER — OXYCODONE-ACETAMINOPHEN 5-325 MG PO TABS
1.0000 | ORAL_TABLET | ORAL | Status: DC | PRN
  Administered 2023-09-04 – 2023-09-06 (×6): 1 via ORAL
  Filled 2023-09-04 (×7): qty 1

## 2023-09-04 MED ORDER — FENTANYL CITRATE PF 50 MCG/ML IJ SOSY: 50.0000 ug | PREFILLED_SYRINGE | Freq: Once | INTRAMUSCULAR | Status: AC

## 2023-09-04 MED ORDER — MORPHINE SULFATE (PF) 2 MG/ML IV SOLN
1.0000 mg | INTRAVENOUS | Status: DC | PRN
  Administered 2023-09-04 – 2023-09-06 (×4): 1 mg via INTRAVENOUS
  Filled 2023-09-04 (×4): qty 1

## 2023-09-04 MED ORDER — INSULIN GLARGINE-YFGN 100 UNIT/ML ~~LOC~~ SOLN
8.0000 [IU] | Freq: Two times a day (BID) | SUBCUTANEOUS | Status: DC
  Administered 2023-09-05 – 2023-09-06 (×3): 8 [IU] via SUBCUTANEOUS
  Filled 2023-09-04 (×5): qty 0.08

## 2023-09-04 MED ORDER — FLUCONAZOLE 100 MG PO TABS
100.0000 mg | ORAL_TABLET | Freq: Every day | ORAL | Status: DC
  Administered 2023-09-04: 100 mg via ORAL
  Filled 2023-09-04 (×2): qty 1

## 2023-09-04 MED ORDER — IRBESARTAN 150 MG PO TABS
150.0000 mg | ORAL_TABLET | Freq: Every day | ORAL | Status: DC
  Administered 2023-09-05 – 2023-09-06 (×2): 150 mg via ORAL
  Filled 2023-09-04 (×2): qty 1

## 2023-09-04 MED ORDER — CLONIDINE HCL 0.1 MG PO TABS
0.3000 mg | ORAL_TABLET | Freq: Three times a day (TID) | ORAL | Status: DC
  Administered 2023-09-05 – 2023-09-09 (×13): 0.3 mg via ORAL
  Filled 2023-09-04 (×14): qty 3

## 2023-09-04 MED ORDER — FERROUS GLUCONATE 324 (38 FE) MG PO TABS
324.0000 mg | ORAL_TABLET | Freq: Every day | ORAL | Status: DC
  Administered 2023-09-05 – 2023-09-15 (×11): 324 mg via ORAL
  Filled 2023-09-04 (×11): qty 1

## 2023-09-04 MED ORDER — ACETAMINOPHEN 325 MG PO TABS: 650.0000 mg | ORAL_TABLET | Freq: Four times a day (QID) | ORAL | Status: DC | PRN

## 2023-09-04 MED ORDER — ENOXAPARIN SODIUM 80 MG/0.8ML IJ SOSY
65.0000 mg | PREFILLED_SYRINGE | INTRAMUSCULAR | Status: DC
  Administered 2023-09-04 – 2023-09-14 (×11): 65 mg via SUBCUTANEOUS
  Filled 2023-09-04 (×11): qty 0.65

## 2023-09-04 MED ORDER — HYDRALAZINE HCL 20 MG/ML IJ SOLN: 5.0000 mg | INTRAMUSCULAR | Status: DC | PRN

## 2023-09-04 MED ORDER — LEVOTHYROXINE SODIUM 50 MCG PO TABS
200.0000 ug | ORAL_TABLET | Freq: Every day | ORAL | Status: DC
  Administered 2023-09-05 – 2023-09-15 (×11): 200 ug via ORAL
  Filled 2023-09-04 (×6): qty 4
  Filled 2023-09-04: qty 2
  Filled 2023-09-04 (×4): qty 4

## 2023-09-04 NOTE — ED Triage Notes (Signed)
 Pt comes via EMS from [facility] with c/o possible wound infections. Pt has wounds to bilateral lower legs and both are wrapped. Doc concerned for infection and they are not healing. Pt is bedboud. VSS  Pt c/o pain in both legs.

## 2023-09-04 NOTE — Progress Notes (Signed)
 ED Pharmacy Antibiotic Sign Off An antibiotic consult was received from an ED provider for cefepime and vancomycin per pharmacy dosing for wound infection. A chart review was completed to assess appropriateness.   The following one time order(s) were placed:  Cefepime 2 g IV x 1 Vancomycin 2.5 g IV x 1  Further antibiotic and/or antibiotic pharmacy consults should be ordered by the admitting provider if indicated.   Thank you for allowing pharmacy to be a part of this patient's care.   Merryl Hacker, [facility]  Clinical Pharmacist 09/04/23 7:20 PM

## 2023-09-04 NOTE — ED Provider Notes (Signed)
 [facility] Provider Note    Event Date/Time   First MD Initiated Contact with Patient 09/04/23 1627     (approximate)   History   Wound Infection   HPI  LAURIN PAULO is a 68 y.o. female past medical history significant for diabetes, hypertension, thyroid disorder, chronic pain, hypothyroidism, heart failure, presents to the emergency department from [facility] with concern for possible wound infections.  Patient states that they have been doing dressing changes to her legs for the past couple of weeks to months.  States they have been very painful to do dressing changes.  Today she was screaming out in pain when they were doing wound changes so the nurse practitioner sent her to the emergency department for evaluation and for concern for nonhealing ulcers/infection.  Denies fever, chills, nausea, vomiting.  No recent antibiotic use.     Physical Exam   Triage Vital Signs: ED Triage Vitals  Encounter Vitals Group     BP 09/04/23 1408 (!) 104/55     Systolic BP Percentile --      Diastolic BP Percentile --      Pulse Rate 09/04/23 1408 83     Resp 09/04/23 1408 19     Temp --      Temp src --      SpO2 09/04/23 1408 94 %     Weight --      Height --      Head Circumference --      Peak Flow --      Pain Score 09/04/23 1402 10     Pain Loc --      Pain Education --      Exclude from Growth Chart --     Most recent vital signs: Vitals:   09/04/23 1708 09/04/23 1833  BP: (!) 126/54 (!) 143/50  Pulse: 77 78  Resp:  18  Temp:    SpO2: 100% 98%    Physical Exam Constitutional:      Appearance: She is well-developed.     Comments: Crying, tearful  HENT:     Head: Atraumatic.  Eyes:     Conjunctiva/sclera: Conjunctivae normal.  Cardiovascular:     Rate and Rhythm: Regular rhythm.  Pulmonary:     Effort: No respiratory distress.  Abdominal:     General: There is no distension.  Musculoskeletal:        General: Normal range  of motion.     Cervical back: Normal range of motion.     Right lower leg: Edema present.     Left lower leg: Edema present.     Comments: See picture below.  Doppler pulse to bilateral DP pulses, good capillary refill  Skin:    General: Skin is warm.     Capillary Refill: Capillary refill takes less than 2 seconds.  Neurological:     Mental Status: She is alert. Mental status is at baseline.            IMPRESSION / MDM / ASSESSMENT AND PLAN / ED COURSE  I reviewed the triage vital signs and the nursing notes.  Differential diagnosis including chronic wound, cellulitis, wound infection, osteomyelitis  On arrival afebrile, hemodynamically stable.   LABS (all labs ordered are listed, but only abnormal results are displayed) Labs interpreted as -    Labs Reviewed  BASIC METABOLIC PANEL - Abnormal; Notable for the following components:      Result Value   Glucose, Bld  167 (*)    Creatinine, Ser 1.47 (*)    Calcium 8.7 (*)    GFR, Estimated 38 (*)    All other components within normal limits  CBC - Abnormal; Notable for the following components:   Hemoglobin 10.9 (*)    HCT 34.7 (*)    Platelets 566 (*)    All other components within normal limits  LACTIC ACID, PLASMA     MDM  Patient was given fentanyl for pain control for dressing change.  No significant leukocytosis.  Creatinine appears at baseline.  Normal lactic acid.  No significant electrolyte abnormality.  Patient was significant pain with dressing changes, yelling out, received IV fentanyl prior to dressing change.  Does have some bleeding when taking down dressing.  Has good peripheral pulse.  Called and discussed with the nurse at [facility], stated that the nurse practitioner today evaluated the patient and stated that the wounds and redness surrounding was worse today than it had been and that it was worsening foul-smelling.  Had recommended the patient come over for antibiotics.  No signs of  sepsis.  Given concern for possible polymicrobial infection will start on IV vancomycin and cefepime, consulted hospitalist for admission for wound infection.     PROCEDURES:  Critical Care performed: No  Procedures  Patient's presentation is most consistent with acute presentation with potential threat to life or bodily function.   MEDICATIONS ORDERED IN ED: Medications  vancomycin (VANCOREADY) IVPB 2000 mg/400 mL (has no administration in time range)  ceFEPIme (MAXIPIME) 2 g in sodium chloride 0.9 % 100 mL IVPB (has no administration in time range)  fentaNYL (SUBLIMAZE) injection 50 mcg (50 mcg Intravenous Given 09/04/23 1708)  fentaNYL (SUBLIMAZE) injection 50 mcg (50 mcg Intravenous Given 09/04/23 1743)    FINAL CLINICAL IMPRESSION(S) / ED DIAGNOSES   Final diagnoses:  Multiple open wounds of lower leg, initial encounter     Rx / DC Orders   ED Discharge Orders     None        Note:  This document was prepared using Dragon voice recognition software and may include unintentional dictation errors.   Corena Herter, MD 09/04/23 1610

## 2023-09-04 NOTE — H&P (Signed)
 History and Physical    [name] ZOX:[id number] DOB: 29-May-1956 DOA: 09/04/2023  Referring MD/NP/PA:   PCP: Pcp, No   Patient coming from:  The patient is coming from SNF   Chief Complaint: leg wounds and leg pain  HPI: [name] is a 68 y.o. female with medical history significant of chronic wound in both legs, sCHF with EF 45%, chronic lymphedema, venous insufficiency in both legs, HTN, DM, PVD, hypothyroidism, CKD-3A, kidney stone, who presents with leg wounds and leg pain  Patient has chronic multiple wounds in both legs since year 2022. Patient has been followed up by wound care. Patient states that they have been doing dressing changes to her legs. Her her wounds have been worsening recently, and become more painful. The pain is constant, sharp, moderate, nonradiating. The nurse practitioner sent her to the emergency department for evaluation and for concern for nonhealing wound with infection. No fever or chills.  Patient does not have chest pain, cough, SOB.  No nausea, vomiting, diarrhea or abdominal pain.  No symptoms of UTI.  Data reviewed independently and ED Course: pt was found to have WBC 10.1, lactic acid 1.1, slightly worsening renal function, temperature normal, blood pressure 143/50, heart rate 78, RR 19, oxygen saturation 100% on room air.  Patient is admitted to telemetry bed as inpatient.   EKG:   Not done in ED, will get one.      Review of Systems:   General: no fevers, chills, no body weight gain, has fatigue HEENT: no blurry vision, hearing changes or sore throat Respiratory: no dyspnea, coughing, wheezing CV: no chest pain, no palpitations GI: no nausea, vomiting, abdominal pain, diarrhea, constipation GU: no dysuria, burning on urination, increased urinary frequency, hematuria  Ext: has leg edema Neuro: no unilateral weakness, numbness, or tingling, no vision change or hearing loss Skin: has multiple wounds in both legs MSK: No muscle  spasm, no deformity, no limitation of range of movement in spin Heme: No easy bruising.  Travel history: No recent long distant travel.   Allergy: No Known Allergies  Past Medical History:  Diagnosis Date   Diabetes mellitus without complication (HCC) 2010   Hypertension    Hypothyroidism    Kidney stone 11/2014   history of/STAGE 4 KIDNEY DISEASE PER DR Wynelle Link   Lymphedema    Multiple open wounds of lower leg    PT BEING SEEN BY THE WOUND CENTER FOR CHRONIC BLISTERS PER PT    Past Surgical History:  Procedure Laterality Date   AMPUTATION Right 05/03/2021   Procedure: AMPUTATION RAY;  Surgeon: Rosetta Posner, DPM;  Location: [facility] ORS;  Service: Podiatry;  Laterality: Right;  RIght Big Toe and Right Fifth Toe   IRRIGATION AND DEBRIDEMENT FOOT Bilateral 05/03/2021   Procedure: IRRIGATION AND DEBRIDEMENT FEET;  Surgeon: Rosetta Posner, DPM;  Location: [facility] ORS;  Service: Podiatry;  Laterality: Bilateral;   LOWER EXTREMITY ANGIOGRAPHY Right 04/27/2021   Procedure: Lower Extremity Angiography;  Surgeon: Renford Dills, MD;  Location: [facility] INVASIVE CV LAB;  Service: Cardiovascular;  Laterality: Right;   LOWER EXTREMITY ANGIOGRAPHY Left 04/30/2021   Procedure: Lower Extremity Angiography;  Surgeon: Annice Needy, MD;  Location: [facility] INVASIVE CV LAB;  Service: Cardiovascular;  Laterality: Left;   TOTAL HIP ARTHROPLASTY Right 05/28/2015   Procedure: TOTAL HIP ARTHROPLASTY ANTERIOR APPROACH;  Surgeon: Kennedy Bucker, MD;  Location: [facility] ORS;  Service: Orthopedics;  Laterality: Right;    Social History:  reports that she has never smoked.  She has never used smokeless tobacco. She reports that she does not drink alcohol and does not use drugs.  Family History:  Family History  Problem Relation Age of Onset   Cancer Mother    Diabetes Father      Prior to Admission medications   Medication Sig Start Date End Date Taking? Authorizing Provider  acetaminophen (TYLENOL) 325 MG tablet Take 2  tablets (650 mg total) by mouth every 6 (six) hours as needed for mild pain (or Fever >/= 101). 05/18/21   Alford Highland, MD  amLODipine (NORVASC) 10 MG tablet Take 1 tablet (10 mg total) by mouth daily. 05/18/21   Alford Highland, MD  ascorbic acid (VITAMIN C) 500 MG tablet Take 1 tablet (500 mg total) by mouth daily. 11/03/21   Alford Highland, MD  blood glucose meter kit and supplies 1 each by Other route daily. ONE TOUCH ULTRA METER. Use as directed. E11.29 10/23/20   Duanne Limerick, MD  Cholecalciferol (VITAMIN D3) 125 MCG (5000 UT) CAPS Take 1 capsule by mouth daily. 10/12/21   [provider]  cloNIDine (CATAPRES) 0.3 MG tablet TAKE ONE TABLET BY MOUTH 3 TIMES DAILY. 10/22/20   Duanne Limerick, MD  clopidogrel (PLAVIX) 75 MG tablet Take 1 tablet (75 mg total) by mouth daily with breakfast. 05/19/21   Alford Highland, MD  furosemide (LASIX) 20 MG tablet Take 1 tablet (20 mg total) by mouth 2 (two) times daily. 05/18/21   Alford Highland, MD  glucose blood test strip 1 each by Other route daily. ONE TOUCH ULTRA TEST STRIPS E11.29 10/26/20   Duanne Limerick, MD  insulin aspart (NOVOLOG) 100 UNIT/ML injection Inject 3 Units into the skin 3 (three) times daily with meals. 05/18/21   Alford Highland, MD  irbesartan (AVAPRO) 150 MG tablet Take 1 tablet (150 mg total) by mouth daily. 05/19/21   Alford Highland, MD  Lancets [facility] ULTRASOFT) lancets 1 each by Other route daily. Use as instructed E11.29 10/23/20   Duanne Limerick, MD  LANTUS SOLOSTAR 100 UNIT/ML Solostar Pen Inject 8 Units into the skin at bedtime. 09/27/21   [provider]  levothyroxine (SYNTHROID) 112 MCG tablet Take 1 tablet (112 mcg total) by mouth daily before breakfast. 12/15/20   Duanne Limerick, MD  metoprolol succinate (TOPROL-XL) 25 MG 24 hr tablet Take 0.5 tablets (12.5 mg total) by mouth daily with breakfast. 05/19/21   Alford Highland, MD  Multiple Vitamin (MULTIVITAMIN WITH MINERALS) TABS tablet Take  1 tablet by mouth daily. 11/04/21   Alford Highland, MD  Nutritional Supplements (,FEEDING SUPPLEMENT, PROSOURCE PLUS) liquid Take 30 mLs by mouth 3 (three) times daily between meals. 11/03/21   Alford Highland, MD  NYSTATIN powder Apply 1 application. topically 3 (three) times daily. 10/29/21   [provider]  ondansetron (ZOFRAN-ODT) 4 MG disintegrating tablet Take 2 mg by mouth every 6 (six) hours as needed. 10/21/21   [provider]  oxyCODONE (OXY IR/ROXICODONE) 5 MG immediate release tablet Take 1 tablet (5 mg total) by mouth every 6 (six) hours as needed for severe pain. 11/03/21   Wieting, Richard, MD  polyethylene glycol (MIRALAX / GLYCOLAX) 17 g packet Take 17 g by mouth daily. 11/04/21   Alford Highland, MD  scopolamine (TRANSDERM-SCOP) 1 MG/3DAYS Place 1 patch (1.5 mg total) onto the skin every 3 (three) days. 11/04/21   Alford Highland, MD  zinc sulfate 220 (50 Zn) MG capsule Take 1 capsule (220 mg total) by mouth daily. 11/04/21  Alford Highland, MD    Physical Exam: Vitals:   09/04/23 1700 09/04/23 1708 09/04/23 1833 09/04/23 1948  BP: (!) 128/44 (!) 126/54 (!) 143/50   Pulse: 72 77 78   Resp: 18  18   Temp:      TempSrc:      SpO2: 98% 100% 98%   Weight:    132.9 kg  Height:    5\' 5"  (1.651 m)   General: Not in acute distress HEENT:       Eyes: PERRL, EOMI, no jaundice       ENT: No discharge from the ears and nose, no pharynx injection, no tonsillar enlargement.        Neck: No JVD, no bruit, no mass felt. Heme: No neck lymph node enlargement. Cardiac: S1/S2, RRR, No murmurs, No gallops or rubs. Respiratory: No rales, wheezing, rhonchi or rubs. GI: Soft, nondistended, nontender, no rebound pain, no organomegaly, BS present. GU: No hematuria Ext: 1+DP/PT pulse bilaterally.  Patient has multiple open wounds, with various stages of healing and scabbing, with bleeding and serosanguineous drainage, with foul smell.            Musculoskeletal:  No joint deformities, No joint redness or warmth, no limitation of ROM in spin. Skin: No rashes.  Neuro: Alert, oriented X3, cranial nerves II-XII grossly intact, moves all extremities normally. Psych: Patient is not psychotic, no suicidal or hemocidal ideation.  Labs on Admission: I have personally reviewed following labs and imaging studies  CBC: Recent Labs  Lab 09/04/23 1403  WBC 10.1  HGB 10.9*  HCT 34.7*  MCV 87.4  PLT 566*   Basic Metabolic Panel: Recent Labs  Lab 09/04/23 1403  NA 137  K 4.4  CL 100  CO2 25  GLUCOSE 167*  BUN 19  CREATININE 1.47*  CALCIUM 8.7*   GFR: Estimated Creatinine Clearance: 49.1 mL/min (A) (by C-G formula based on SCr of 1.47 mg/dL (H)). Liver Function Tests: No results for input(s): "AST", "ALT", "ALKPHOS", "BILITOT", "PROT", "ALBUMIN" in the last 168 hours. No results for input(s): "LIPASE", "AMYLASE" in the last 168 hours. No results for input(s): "AMMONIA" in the last 168 hours. Coagulation Profile: No results for input(s): "INR", "PROTIME" in the last 168 hours. Cardiac Enzymes: No results for input(s): "CKTOTAL", "CKMB", "CKMBINDEX", "TROPONINI" in the last 168 hours. BNP (last 3 results) No results for input(s): "PROBNP" in the last 8760 hours. HbA1C: No results for input(s): "HGBA1C" in the last 72 hours. CBG: No results for input(s): "GLUCAP" in the last 168 hours. Lipid Profile: No results for input(s): "CHOL", "HDL", "LDLCALC", "TRIG", "CHOLHDL", "LDLDIRECT" in the last 72 hours. Thyroid Function Tests: No results for input(s): "TSH", "T4TOTAL", "FREET4", "T3FREE", "THYROIDAB" in the last 72 hours. Anemia Panel: No results for input(s): "VITAMINB12", "FOLATE", "FERRITIN", "TIBC", "IRON", "RETICCTPCT" in the last 72 hours. Urine analysis:    Component Value Date/Time   COLORURINE YELLOW (A) 11/03/2021 0949   APPEARANCEUR CLOUDY (A) 11/03/2021 0949   LABSPEC 1.015 11/03/2021 0949   PHURINE 7.0 11/03/2021 0949    GLUCOSEU NEGATIVE 11/03/2021 0949   HGBUR SMALL (A) 11/03/2021 0949   BILIRUBINUR NEGATIVE 11/03/2021 0949   BILIRUBINUR negative 12/22/2014 0846   KETONESUR NEGATIVE 11/03/2021 0949   PROTEINUR NEGATIVE 11/03/2021 0949   UROBILINOGEN 0.2 12/22/2014 0846   NITRITE NEGATIVE 11/03/2021 0949   LEUKOCYTESUR TRACE (A) 11/03/2021 0949   Sepsis Labs: @LABRCNTIP (procalcitonin:4,lacticidven:4) )No results found for this or any previous visit (from the past 240 hours).   Radiological  Exams on Admission:   Assessment/Plan Principal Problem:   Wound infection Active Problems:   Chronic systolic CHF (congestive heart failure) (HCC)   Essential hypertension   Type II diabetes mellitus with renal manifestations (HCC)   Hypothyroidism   PVD (peripheral vascular disease) (HCC)   Stage 3a chronic kidney disease (CKD) (HCC)   Obesity, Class III, BMI 40-49.9 (morbid obesity) (HCC)   Assessment and Plan:   Wound infection in both legs: Patient does not have fever or leukocytosis, clinically not septic, but symptoms are severe as show in the pictures.  - will admit to tele bed   as inpatient - Empiric antimicrobial treatment with vancomycin and cefepime - Add Diflucan 100 mg daily - PRN Zofran for nausea, morphine and Percocet for pain - Blood cultures x 2  - ESR and CRP - wound care consult  Chronic systolic CHF (congestive heart failure) (HCC): 2D echo on 07/28/2016 showed EF of 45%.  Patient does not have shortness of breath, does not seem to have CHF exacerbation. -Continue home Lasix -Check BNP  Essential hypertension -IV hydralazine as needed -Continue home amlodipine, clonidine, metoprolol, irbesartan  Type II diabetes mellitus with renal manifestations [facility]): Recent A1c 7.9, poorly controlled.  Patient is taking NovoLog and Lantus 11 units twice daily -Sliding scale insulin -Glargine insulin 8 units twice daily  Hypothyroidism -Synthroid  PVD (peripheral vascular disease)  (HCC) -Plavix  Stage 3a chronic kidney disease (CKD) (HCC): Renal function slightly worse than the baseline.  Recent baseline creatinine 1.24 on 04/02/2022.  Her creatinine is 1.47, BUN 19, GFR of 38 -Follow-up with BMP  Morbid obesity: Body weight 132.9 kg, BMI 48.76 -Encourage losing weight -Exercise healthy diet        DVT ppx: SQ Lovenox  Code Status: Full code   Family Communication:     not done, no family member is at bed side.  Disposition Plan:  Anticipate discharge back to previous environment, SNF  Consults called:  none  Admission status and Level of care: Telemetry [facility] inpt        Dispo: The patient is from: SNF              Anticipated d/c is to: SNF              Anticipated d/c date is: 2 days              Patient currently is not medically stable to d/c.    Severity of Illness:  The appropriate patient status for this patient is INPATIENT. Inpatient status is judged to be reasonable and necessary in order to provide the required intensity of service to ensure the patient's safety. The patient's presenting symptoms, physical exam findings, and initial radiographic and laboratory data in the context of their chronic comorbidities is felt to place them at high risk for further clinical deterioration. Furthermore, it is not anticipated that the patient will be medically stable for discharge from the hospital within 2 midnights of admission.   * I certify that at the point of admission it is my clinical judgment that the patient will require inpatient hospital care spanning beyond 2 midnights from the point of admission due to high intensity of service, high risk for further deterioration and high frequency of surveillance required.*       Date of Service 09/04/2023    Lorretta Harp [facility]   If 7PM-7AM, please contact night-coverage [email or web address] 09/04/2023, 8:44 PM

## 2023-09-04 NOTE — Progress Notes (Signed)
 PHARMACIST - PHYSICIAN COMMUNICATION  CONCERNING:  Enoxaparin (Lovenox) for DVT Prophylaxis    RECOMMENDATION: Patient was prescribed enoxaprin 40mg  q24 hours for VTE prophylaxis.   Filed Weights   09/04/23 1948  Weight: 132.9 kg (293 lb)    Body mass index is 48.76 kg/m.  Estimated Creatinine Clearance: 49.1 mL/min (A) (by C-G formula based on SCr of 1.47 mg/dL (H)).   Based on [facility] policy patient is candidate for enoxaparin 0.5mg /kg TBW SQ every 24 hours based on BMI being >30.  DESCRIPTION: Pharmacy has adjusted enoxaparin dose per [facility] policy.  Patient is now receiving enoxaparin 65 mg every 24 hours    Merryl Hacker, PharmD Clinical Pharmacist  09/04/2023 7:53 PM

## 2023-09-05 ENCOUNTER — Inpatient Hospital Stay

## 2023-09-05 DIAGNOSIS — M79604 Pain in right leg: Secondary | ICD-10-CM

## 2023-09-05 DIAGNOSIS — M79605 Pain in left leg: Secondary | ICD-10-CM | POA: Diagnosis not present

## 2023-09-05 DIAGNOSIS — I87333 Chronic venous hypertension (idiopathic) with ulcer and inflammation of bilateral lower extremity: Secondary | ICD-10-CM | POA: Diagnosis not present

## 2023-09-05 LAB — C-REACTIVE PROTEIN: CRP: 19.6 mg/dL — ABNORMAL HIGH (ref <1.0)

## 2023-09-05 LAB — GLUCOSE, CAPILLARY
Glucose-Capillary: 173 mg/dL — ABNORMAL HIGH (ref 70–99)
Glucose-Capillary: 132 mg/dL — ABNORMAL HIGH (ref 70–99)
Glucose-Capillary: 167 mg/dL — ABNORMAL HIGH (ref 70–99)

## 2023-09-05 LAB — CBC
HCT: 32.4 % — ABNORMAL LOW (ref 36.0–46.0)
Hemoglobin: 10 g/dL — ABNORMAL LOW (ref 12.0–15.0)
MCH: 27.1 pg (ref 26.0–34.0)
MCHC: 30.9 g/dL (ref 30.0–36.0)
MCV: 87.8 fL (ref 80.0–100.0)
Platelets: 565 10*3/uL — ABNORMAL HIGH (ref 150–400)
RBC: 3.69 MIL/uL — ABNORMAL LOW (ref 3.87–5.11)
RDW: 15.1 % (ref 11.5–15.5)
WBC: 10.4 10*3/uL (ref 4.0–10.5)
nRBC: 0 % (ref 0.0–0.2)

## 2023-09-05 LAB — BASIC METABOLIC PANEL
Anion gap: 12 (ref 5–15)
BUN: 23 mg/dL (ref 8–23)
CO2: 23 mmol/L (ref 22–32)
Calcium: 8.7 mg/dL — ABNORMAL LOW (ref 8.9–10.3)
Chloride: 100 mmol/L (ref 98–111)
Creatinine, Ser: 1.47 mg/dL — ABNORMAL HIGH (ref 0.44–1.00)
GFR, Estimated: 38 mL/min — ABNORMAL LOW (ref >60)
Glucose, Bld: 149 mg/dL — ABNORMAL HIGH (ref 70–99)
Potassium: 4.3 mmol/L (ref 3.5–5.1)
Sodium: 135 mmol/L (ref 135–145)

## 2023-09-05 LAB — IRON AND TIBC
Iron: 18 ug/dL — ABNORMAL LOW (ref 28–170)
Saturation Ratios: 12 % (ref 10.4–31.8)
TIBC: 147 ug/dL — ABNORMAL LOW (ref 250–450)
UIBC: 129 ug/dL

## 2023-09-05 LAB — FERRITIN: Ferritin: 107 ng/mL (ref 11–307)

## 2023-09-05 LAB — HIV ANTIBODY (ROUTINE TESTING W REFLEX): HIV Screen 4th Generation wRfx: NONREACTIVE

## 2023-09-05 LAB — CBG MONITORING, ED: Glucose-Capillary: 117 mg/dL — ABNORMAL HIGH (ref 70–99)

## 2023-09-05 MED ORDER — FUROSEMIDE 40 MG PO TABS
40.0000 mg | ORAL_TABLET | Freq: Two times a day (BID) | ORAL | Status: DC
  Administered 2023-09-05 – 2023-09-06 (×2): 40 mg via ORAL
  Filled 2023-09-05 (×2): qty 1

## 2023-09-05 MED ORDER — HYDROXYZINE HCL 10 MG PO TABS
10.0000 mg | ORAL_TABLET | Freq: Once | ORAL | Status: AC
  Administered 2023-09-05: 10 mg via ORAL
  Filled 2023-09-05: qty 1

## 2023-09-05 MED ORDER — HYDROXYZINE HCL 10 MG PO TABS
10.0000 mg | ORAL_TABLET | Freq: Four times a day (QID) | ORAL | Status: DC | PRN
  Administered 2023-09-06 – 2023-09-09 (×4): 10 mg via ORAL
  Filled 2023-09-05 (×5): qty 1

## 2023-09-05 MED ORDER — CAMPHOR-MENTHOL 0.5-0.5 % EX LOTN: TOPICAL_LOTION | CUTANEOUS | Status: DC | PRN

## 2023-09-05 MED ORDER — FENTANYL CITRATE PF 50 MCG/ML IJ SOSY
25.0000 ug | PREFILLED_SYRINGE | Freq: Once | INTRAMUSCULAR | Status: AC
  Administered 2023-09-05: 25 ug via INTRAVENOUS
  Filled 2023-09-05: qty 1

## 2023-09-05 NOTE — Consult Note (Signed)
 WOC Nurse Consult Note: patient with longstanding wounds to B lower legs; no wound care notes can be found regarding ongoing care of these Reason for Consult: B leg wounds Wound type: full and partial thickness wounds r/t venous hypertension and peripheral vascular disease  Pressure Injury POA: NA  Measurement: see nursing flowsheet  Wound bed: all wounds appear largely pink and moist; scattered areas of dark eschar  Drainage (amount, consistency, odor)  per MD note foul smelling serosanguinous  Periwound: hyperkeratotic peeling skin  Dressing procedure/placement/frequency: Cleanse bilateral lower legs (intact skin and areas of ulceration) with Vashe wound cleanser [place] [phone]), do not rinse and allow to air dry. Apply Xeroform gauze [place] [phone]) to wound beds daily, cover with ABD pads and secure with Kerlix roll gauze wrapped starting just above toes and ending right below knees.  May secure entire dressing with Ace bandage wrapped in same fashion as Kerlix roll gauze which will also provide some light compression.   Patient will need ongoing management of these wounds as they are related to venous insufficiency.  If patient is not being followed by local wound care center or vascular surgeon would recommend referral at discharge.    POC discussed with bedside nurse. WOC team will not follow. Re-consult if further needs arise.   Thank you,    Priscella Mann MSN, RN-BC, [employer] [phone]

## 2023-09-05 NOTE — ED Notes (Signed)
 Pt called out wanting pain meds for her legs, RN made aware.

## 2023-09-05 NOTE — Hospital Course (Addendum)
 68 year old female past medical history of chronic wounds bilateral legs, systolic congestive heart failure with EF 45%, chronic lymphedema, hypertension, type 2 diabetes mellitus, peripheral vascular disease, hypothyroidism, CKD stage IIIa presents with leg wounds and leg pain.  Patient has chronic wounds both legs since 2022 and following up at wound care.  Patient admitted to the hospital on 09/04/2023.  Looks like initially started on antibiotics but then discontinued.  ABI negative.  Sedimentation rate greater than 140  3/15.  MRI left leg reviewed showing osteomyelitis.  Case discussed with infectious disease specialist and will start Rocephin and Zyvox. 3/16.  Wound culture as a few gram-positive cocci in pairs and a few gram variable rod. 3/17. wound culture still showing gram-positive cocci in pairs and gram variable rod

## 2023-09-05 NOTE — Progress Notes (Signed)
 Progress Note   Patient: [name] ZOX:[id number] DOB: 1955-07-10 DOA: 09/04/2023     1 DOS: the patient was seen and examined on 09/05/2023   Brief hospital course:   Assessment and Plan:  Chronic venous stasis wounds  Patient with ulcerations of bilateral lower extremities. BLE are not WTT, erythema appears to be related to chronic venous insufficiency. Will hold on antiinfectives for now as patient had no leukocytosis/ fever on presentation and remains with these. Will continue wound care. Will check ABI to see if this is contributing to her leg discomfort. Will replete iron.  F/u BCx2  Chronic systolic HF  TTE 8119 with EF 14%. Trace to 1+  edema of BLE. Does not appear grossly volume overloaded. BNP WNL though patient is morbidly obese. Continue home dose of diuretic.    CKD stage 3a/3b Last GFR ~1 year ago and was 47. This admission 38. Possibly progression versus acute elevation in serum creatinine.  -Will continue to monitor   Normocytic anemia  Iron/TIBC/Ferritin/ %Sat    Component Value Date/Time   IRON 18 (L) 09/05/2023 0254   TIBC 147 (L) 09/05/2023 0254   FERRITIN 107 09/05/2023 0254   IRONPCTSAT 12 09/05/2023 0254  Stable. Patient reports she has never had a colonoscopy. TSAT of 12% Will start iron supplementation in the AM with IV Fe.   Thrombocytosis  Will continue to monitor. Likely reactive.   Hypothyroidism Synthroid  Morbid obesity BMI 48  PAD Conitnue plavix, F/u ABI/TBI     Subjective: Patient reports that she had some lower extremity discomfort for some time. She says that on the day she presented to the hospital the pain was worse as she had not had dressing changes over the weekend. She noted that she felt her wounds on the RLE were causing her discomfort but she had a deep cramping pain of the LLE. The WOC nurse at [facility] looked at her wounds and felt she needed further evaluation in the setting of worsening pain.   Physical  Exam: Vitals:   09/05/23 0900 09/05/23 0908 09/05/23 0933 09/05/23 1651  BP:  134/62 (!) 132/47 (!) 128/52  Pulse: (!) 107 87 (!) 106 94  Resp: 19 19 18 17   Temp:   98.8 F (37.1 C) 98.3 F (36.8 C)  TempSrc:      SpO2: 100% 99% 97% 98%  Weight:      Height:       Physical Exam  Constitutional: In no distress.  Cardiovascular: Normal rate, regular rhythm. No lower extremity edema  Pulmonary: Non labored breathing on room air, no wheezing or rales. 1+ lower extremity edema bilaterally  Abdominal: Soft. Normal bowel sounds. 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   Data Reviewed:     Latest Ref Rng & Units 09/05/2023    2:56 AM 09/04/2023    2:03 PM 04/02/2022    1:21 PM  CBC  WBC 4.0 - 10.5 K/uL 10.4  10.1  10.7   Hemoglobin 12.0 - 15.0 g/dL 78.2  95.6  21.3   Hematocrit 36.0 - 46.0 % 32.4  34.7  37.0   Platelets 150 - 400 K/uL 565  566  488       Latest Ref Rng & Units 09/05/2023    2:56 AM 09/04/2023    2:03 PM 04/02/2022    1:21 PM  BMP  Glucose 70 - 99 mg/dL 086  167  241   BUN 8 - 23 mg/dL 23  19  20    Creatinine 0.44 - 1.00 mg/dL 1.30  8.65  7.84   Sodium 135 - 145 mmol/L 135  137  138   Potassium 3.5 - 5.1 mmol/L 4.3  4.4  3.7   Chloride 98 - 111 mmol/L 100  100  104   CO2 22 - 32 mmol/L 23  25  27    Calcium 8.9 - 10.3 mg/dL 8.7  8.7  8.8   ' O9GE Poorly controlled LA 8u + SSI    HTN Controlled. Continue home medications   Family Communication: None at bedside.   Disposition: Status is: Inpatient Remains inpatient appropriate because: Management o venous ulcer disease  Planned Discharge Destination:  Pending clinical course     Time spent: 35 minutes  Author: Marolyn Haller, MD 09/05/2023 6:06 PM  For on call review [email or web address].

## 2023-09-06 ENCOUNTER — Inpatient Hospital Stay

## 2023-09-06 DIAGNOSIS — M79605 Pain in left leg: Secondary | ICD-10-CM | POA: Diagnosis not present

## 2023-09-06 DIAGNOSIS — M79604 Pain in right leg: Secondary | ICD-10-CM | POA: Diagnosis not present

## 2023-09-06 DIAGNOSIS — I87333 Chronic venous hypertension (idiopathic) with ulcer and inflammation of bilateral lower extremity: Secondary | ICD-10-CM | POA: Diagnosis not present

## 2023-09-06 LAB — BASIC METABOLIC PANEL
Anion gap: 12 (ref 5–15)
BUN: 26 mg/dL — ABNORMAL HIGH (ref 8–23)
CO2: 25 mmol/L (ref 22–32)
Calcium: 8.9 mg/dL (ref 8.9–10.3)
Chloride: 100 mmol/L (ref 98–111)
Creatinine, Ser: 1.89 mg/dL — ABNORMAL HIGH (ref 0.44–1.00)
GFR, Estimated: 28 mL/min — ABNORMAL LOW (ref >60)
Glucose, Bld: 154 mg/dL — ABNORMAL HIGH (ref 70–99)
Potassium: 4 mmol/L (ref 3.5–5.1)
Sodium: 137 mmol/L (ref 135–145)

## 2023-09-06 LAB — CBC
HCT: 34.6 % — ABNORMAL LOW (ref 36.0–46.0)
Hemoglobin: 10.7 g/dL — ABNORMAL LOW (ref 12.0–15.0)
MCH: 26.7 pg (ref 26.0–34.0)
MCHC: 30.9 g/dL (ref 30.0–36.0)
MCV: 86.3 fL (ref 80.0–100.0)
Platelets: 662 10*3/uL — ABNORMAL HIGH (ref 150–400)
RBC: 4.01 MIL/uL (ref 3.87–5.11)
RDW: 15 % (ref 11.5–15.5)
WBC: 10.2 10*3/uL (ref 4.0–10.5)
nRBC: 0 % (ref 0.0–0.2)

## 2023-09-06 LAB — GLUCOSE, CAPILLARY
Glucose-Capillary: 131 mg/dL — ABNORMAL HIGH (ref 70–99)
Glucose-Capillary: 205 mg/dL — ABNORMAL HIGH (ref 70–99)
Glucose-Capillary: 205 mg/dL — ABNORMAL HIGH (ref 70–99)
Glucose-Capillary: 204 mg/dL — ABNORMAL HIGH (ref 70–99)

## 2023-09-06 LAB — HEMOGLOBIN A1C
Hgb A1c MFr Bld: 8.1 % — ABNORMAL HIGH (ref 4.8–5.6)
Mean Plasma Glucose: 185.77 mg/dL

## 2023-09-06 LAB — TSH: TSH: 1.38 u[IU]/mL (ref 0.350–4.500)

## 2023-09-06 MED ORDER — HYDROMORPHONE HCL 2 MG PO TABS: 1.0000 mg | ORAL_TABLET | Freq: Four times a day (QID) | ORAL | Status: DC | PRN

## 2023-09-06 MED ORDER — MORPHINE SULFATE (PF) 2 MG/ML IV SOLN
2.0000 mg | Freq: Once | INTRAVENOUS | Status: DC
  Filled 2023-09-06: qty 1

## 2023-09-06 MED ORDER — POLYETHYLENE GLYCOL 3350 17 G PO PACK
17.0000 g | PACK | Freq: Every day | ORAL | Status: DC
  Administered 2023-09-06 – 2023-09-07 (×2): 17 g via ORAL
  Filled 2023-09-06 (×4): qty 1

## 2023-09-06 MED ORDER — ACETAMINOPHEN 325 MG PO TABS
650.0000 mg | ORAL_TABLET | Freq: Four times a day (QID) | ORAL | Status: DC
  Administered 2023-09-06 – 2023-09-11 (×19): 650 mg via ORAL
  Administered 2023-09-11: 325 mg via ORAL
  Administered 2023-09-11 – 2023-09-15 (×15): 650 mg via ORAL
  Filled 2023-09-06 (×35): qty 2

## 2023-09-06 MED ORDER — HYDROMORPHONE HCL 2 MG PO TABS
1.0000 mg | ORAL_TABLET | ORAL | Status: DC | PRN
  Administered 2023-09-06 – 2023-09-13 (×10): 1 mg via ORAL
  Filled 2023-09-06 (×9): qty 1

## 2023-09-06 MED ORDER — HYDROMORPHONE HCL 2 MG PO TABS
1.0000 mg | ORAL_TABLET | Freq: Two times a day (BID) | ORAL | Status: DC | PRN
  Filled 2023-09-06: qty 1

## 2023-09-06 MED ORDER — SODIUM CHLORIDE 0.9 % IV SOLN
300.0000 mg | Freq: Once | INTRAVENOUS | Status: AC
  Administered 2023-09-06: 300 mg via INTRAVENOUS
  Filled 2023-09-06: qty 300

## 2023-09-06 MED ORDER — INSULIN GLARGINE-YFGN 100 UNIT/ML ~~LOC~~ SOLN
10.0000 [IU] | Freq: Two times a day (BID) | SUBCUTANEOUS | Status: DC
  Administered 2023-09-06 – 2023-09-11 (×10): 10 [IU] via SUBCUTANEOUS
  Filled 2023-09-06 (×11): qty 0.1

## 2023-09-06 MED ORDER — HYDROMORPHONE HCL 1 MG/ML IJ SOLN
0.5000 mg | INTRAMUSCULAR | Status: DC | PRN
  Administered 2023-09-08 – 2023-09-15 (×5): 0.5 mg via INTRAVENOUS
  Filled 2023-09-06 (×4): qty 0.5

## 2023-09-06 MED ORDER — SENNOSIDES-DOCUSATE SODIUM 8.6-50 MG PO TABS
2.0000 | ORAL_TABLET | Freq: Every day | ORAL | Status: DC
  Administered 2023-09-06 – 2023-09-10 (×3): 2 via ORAL
  Filled 2023-09-06 (×5): qty 2

## 2023-09-06 MED ORDER — FUROSEMIDE 20 MG PO TABS
20.0000 mg | ORAL_TABLET | Freq: Every day | ORAL | Status: DC
  Administered 2023-09-07 – 2023-09-15 (×9): 20 mg via ORAL
  Filled 2023-09-06 (×9): qty 1

## 2023-09-06 NOTE — Plan of Care (Signed)
  Problem: Education: Goal: Ability to describe self-care measures that may prevent or decrease complications (Diabetes Survival Skills Education) will improve Outcome: Progressing   Problem: Fluid Volume: Goal: Ability to maintain a balanced intake and output will improve Outcome: Progressing   Problem: Nutritional: Goal: Maintenance of adequate nutrition will improve Outcome: Progressing   Problem: Education: Goal: Knowledge of General Education information will improve Description: Including pain rating scale, medication(s)/side effects and non-pharmacologic comfort measures Outcome: Progressing   Problem: Health Behavior/Discharge Planning: Goal: Ability to manage health-related needs will improve Outcome: Progressing   Problem: Clinical Measurements: Goal: Will remain free from infection Outcome: Progressing Goal: Diagnostic test results will improve Outcome: Progressing Goal: Respiratory complications will improve Outcome: Progressing Goal: Cardiovascular complication will be avoided Outcome: Progressing   Problem: Coping: Goal: Level of anxiety will decrease Outcome: Progressing   Problem: Elimination: Goal: Will not experience complications related to bowel motility Outcome: Progressing Goal: Will not experience complications related to urinary retention Outcome: Progressing   Problem: Pain Managment: Goal: General experience of comfort will improve and/or be controlled Outcome: Progressing   Problem: Safety: Goal: Ability to remain free from injury will improve Outcome: Progressing   Problem: Skin Integrity: Goal: Risk for impaired skin integrity will decrease Outcome: Progressing   Problem: Clinical Measurements: Goal: Ability to avoid or minimize complications of infection will improve Outcome: Progressing

## 2023-09-06 NOTE — Progress Notes (Signed)
 Notified Dr. Montez Morita that patient's BP 111/45, HR 73. Clonidine due. Patient asking for pain meds, just returned from ultrasound. she's refusing for dressings to legs to be changed. MD gave order to hold clonidine.

## 2023-09-06 NOTE — Progress Notes (Signed)
 Pt was crying in pain after right leg dressing change and asking for something to help with pain. All prn pain medication had been given prior to dressing change. Provider notified. One time order for morphine received.   Patient declined this one time order at this time stating her pain is now bearable but would like to use it prior to her left leg dressing change during day shift.

## 2023-09-06 NOTE — Plan of Care (Signed)
 Pt refusing to turn or slightly move expressing that it will hurt her. She even refused to turn to make sure her bad was dry under her. Extensive education throughout the night provided to A&O X4 patient regarding pressure injury and importance of bed mobility. Pt expressed understanding but continued to refused turns.    Problem: Pain Managment: Goal: General experience of comfort will improve and/or be controlled Outcome: Progressing   Problem: Activity: Goal: Risk for activity intolerance will decrease Outcome: Not Progressing   Problem: Skin Integrity: Goal: Risk for impaired skin integrity will decrease Outcome: Not Progressing   Problem: Skin Integrity: Goal: Skin integrity will improve Outcome: Not Progressing

## 2023-09-06 NOTE — Progress Notes (Signed)
 Progress Note   Patient: [name]:[id number] DOB: 04-07-1956 DOA: 09/04/2023     2 DOS: the patient was seen and examined on 09/06/2023   Brief hospital course:   Assessment and Plan:  Chronic venous stasis wounds  Acute on chronic BLE pain  Patient with ulcerations of bilateral lower extremities. BLE are not WTT, erythema appears to be related to chronic venous insufficiency. Will hold on antiinfectives for now as patient had no leukocytosis/ fever on presentation and remains absent of these. Considered arterial insufficiency as possibly contributing to patients symptoms as she has a history of peripheral arterial procedure. Patient also noted to have uncontrolled diabetes and iron deficiency, which could be causing peripheral neuropathy/RLS but would not expect these to cause her acutely worsening pain. Finally, given patient is sedentary also considered DVT.   Plan:  F/u ABI, venous duplex studies for DVT Start IV iron supplementation  Start low dose gabapentin at night time Will consider further imaging if above unrevealing  Chronic systolic HF  TTE 8119 with EF 14%. Trace to 1+  edema of BLE. Does not appear grossly volume overloaded. BNP WNL though patient is morbidly obese. -Increased diuretic to 40mg  BID  Acute renal insufficiency on CKD stage 3a/3b Last GFR ~1 year ago was 47. On admission 38. Possibly progression versus acute elevation in serum creatinine. Patient's serum creatinine 1.89<1.47. Likely in the setting of increased diuretic   -Continue to monitor with daily BMP -Reduce lasix to 20mg  daily    Normocytic anemia  Iron/TIBC/Ferritin/ %Sat    Component Value Date/Time   IRON 18 (L) 09/05/2023 0254   TIBC 147 (L) 09/05/2023 0254   FERRITIN 107 09/05/2023 0254   IRONPCTSAT 12 09/05/2023 0254  Stable. Patient reports she has never had a colonoscopy. TSAT of 12% Will give dose of IV venofer    Thrombocytosis     Latest Ref Rng & Units 09/06/2023     5:36 AM 09/05/2023    2:56 AM 09/04/2023    2:03 PM  CBC  WBC 4.0 - 10.5 K/uL 10.2  10.4  10.1   Hemoglobin 12.0 - 15.0 g/dL 78.2  95.6  21.3   Hematocrit 36.0 - 46.0 % 34.6  32.4  34.7   Platelets 150 - 400 K/uL 662  565  566   Stable, likely reactive.   T2DM A1c 8.1. Poorly controlled Increase LA to 10u +SSI  CBG (last 3)  Recent Labs    09/05/23 2110 09/06/23 0738 09/06/23 1130  GLUCAP 167* 131* 205*    HTN Controlled. 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 She feels RLE pain is improved, until she had a dressing change this AM.   Physical Exam: Vitals:   09/06/23 0421 09/06/23 0737 09/06/23 0929 09/06/23 1427  BP: (!) 111/50 (!) 160/51 (!) 160/51 (!) 103/44  Pulse: 80 87 87 74  Resp: 18 16  16   Temp: 98.5 F (36.9 C) 98.4 F (36.9 C)  98.1 F (36.7 C)  TempSrc:  Oral  Oral  SpO2: 98% 100%  96%  Weight:      Height:         Constitutional: In no distress.  Cardiovascular: Normal rate, regular rhythm. No lower extremity edema.  Pulmonary: Non labored breathing on room air, no wheezing or rales.  Trace to 1+  lower extremity edema Abdominal: Soft. Obese. Normal bowel sounds. 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     Data Reviewed:     Latest Ref Rng & Units 09/06/2023    5:36 AM 09/05/2023    2:56 AM 09/04/2023    2:03 PM  CBC  WBC 4.0 - 10.5 K/uL 10.2  10.4  10.1   Hemoglobin 12.0 - 15.0 g/dL 11.9  14.7  82.9   Hematocrit 36.0 - 46.0 % 34.6  32.4  34.7   Platelets 150 - 400 K/uL 662  565  566       Latest Ref Rng & Units 09/06/2023    5:36 AM 09/05/2023    2:56 AM 09/04/2023    2:03 PM  BMP  Glucose 70 - 99 mg/dL 562  130  865   BUN 8 - 23 mg/dL 26  23  19    Creatinine  0.44 - 1.00 mg/dL 7.84  6.96  2.95   Sodium 135 - 145 mmol/L 137  135  137   Potassium 3.5 - 5.1 mmol/L 4.0  4.3  4.4   Chloride 98 - 111 mmol/L 100  100  100   CO2 22 - 32 mmol/L 25  23  25    Calcium 8.9 - 10.3 mg/dL 8.9  8.7  8.7   '   Family Communication: None at bedside.   Disposition: Status is: Inpatient Remains inpatient appropriate because: Management of venous ulcer disease and lower extremity pain   Planned Discharge Destination:  Pending clinical course     Time spent: 35 minutes  Author: Marolyn Haller, MD 09/06/2023 4:13 PM  For on call review [email or web address].

## 2023-09-07 DIAGNOSIS — M79604 Pain in right leg: Secondary | ICD-10-CM | POA: Diagnosis not present

## 2023-09-07 DIAGNOSIS — M79605 Pain in left leg: Secondary | ICD-10-CM | POA: Diagnosis not present

## 2023-09-07 DIAGNOSIS — I87333 Chronic venous hypertension (idiopathic) with ulcer and inflammation of bilateral lower extremity: Secondary | ICD-10-CM | POA: Diagnosis not present

## 2023-09-07 LAB — BASIC METABOLIC PANEL
Anion gap: 8 (ref 5–15)
BUN: 30 mg/dL — ABNORMAL HIGH (ref 8–23)
CO2: 25 mmol/L (ref 22–32)
Calcium: 8.5 mg/dL — ABNORMAL LOW (ref 8.9–10.3)
Chloride: 102 mmol/L (ref 98–111)
Creatinine, Ser: 1.69 mg/dL — ABNORMAL HIGH (ref 0.44–1.00)
GFR, Estimated: 32 mL/min — ABNORMAL LOW (ref >60)
Glucose, Bld: 156 mg/dL — ABNORMAL HIGH (ref 70–99)
Potassium: 4.4 mmol/L (ref 3.5–5.1)
Sodium: 135 mmol/L (ref 135–145)

## 2023-09-07 LAB — CBC
HCT: 32.3 % — ABNORMAL LOW (ref 36.0–46.0)
Hemoglobin: 10.1 g/dL — ABNORMAL LOW (ref 12.0–15.0)
MCH: 26.9 pg (ref 26.0–34.0)
MCHC: 31.3 g/dL (ref 30.0–36.0)
MCV: 85.9 fL (ref 80.0–100.0)
Platelets: 550 10*3/uL — ABNORMAL HIGH (ref 150–400)
RBC: 3.76 MIL/uL — ABNORMAL LOW (ref 3.87–5.11)
RDW: 15.2 % (ref 11.5–15.5)
WBC: 8.2 10*3/uL (ref 4.0–10.5)
nRBC: 0 % (ref 0.0–0.2)

## 2023-09-07 LAB — GLUCOSE, CAPILLARY
Glucose-Capillary: 139 mg/dL — ABNORMAL HIGH (ref 70–99)
Glucose-Capillary: 172 mg/dL — ABNORMAL HIGH (ref 70–99)
Glucose-Capillary: 136 mg/dL — ABNORMAL HIGH (ref 70–99)
Glucose-Capillary: 144 mg/dL — ABNORMAL HIGH (ref 70–99)

## 2023-09-07 LAB — CK: Total CK: 15 U/L — ABNORMAL LOW (ref 38–234)

## 2023-09-07 LAB — MAGNESIUM: Magnesium: 2.3 mg/dL (ref 1.7–2.4)

## 2023-09-07 MED ORDER — HYDROMORPHONE HCL 1 MG/ML IJ SOLN
1.0000 mg | Freq: Once | INTRAMUSCULAR | Status: AC
  Administered 2023-09-08: 1 mg via INTRAVENOUS
  Filled 2023-09-07: qty 1

## 2023-09-07 MED ORDER — GABAPENTIN 300 MG PO CAPS
300.0000 mg | ORAL_CAPSULE | Freq: Every day | ORAL | Status: DC
  Administered 2023-09-07 – 2023-09-14 (×8): 300 mg via ORAL
  Filled 2023-09-07 (×8): qty 1

## 2023-09-07 MED ORDER — HYDROMORPHONE HCL 2 MG PO TABS
2.0000 mg | ORAL_TABLET | Freq: Two times a day (BID) | ORAL | Status: DC | PRN
  Administered 2023-09-08 – 2023-09-13 (×3): 2 mg via ORAL
  Filled 2023-09-07 (×5): qty 1

## 2023-09-07 NOTE — Progress Notes (Signed)
 2nd attempt to do dressing changes, patient asked if it can be done at a later time because she is sleeping. Patient educated on importance of doing dressing change. Patient agreed but still wants to defer change to later In the day.

## 2023-09-07 NOTE — Progress Notes (Signed)
 Patient refused dressing change at this time.

## 2023-09-07 NOTE — Plan of Care (Signed)
  Problem: Metabolic: Goal: Ability to maintain appropriate glucose levels will improve Outcome: Progressing   Problem: Health Behavior/Discharge Planning: Goal: Ability to manage health-related needs will improve Outcome: Progressing   Problem: Skin Integrity: Goal: Risk for impaired skin integrity will decrease Outcome: Progressing   Problem: Education: Goal: Knowledge of General Education information will improve Description: Including pain rating scale, medication(s)/side effects and non-pharmacologic comfort measures Outcome: Progressing   Problem: Coping: Goal: Level of anxiety will decrease Outcome: Progressing   Problem: Pain Managment: Goal: General experience of comfort will improve and/or be controlled Outcome: Progressing   Problem: Skin Integrity: Goal: Skin integrity will improve Outcome: Progressing   Problem: Safety: Goal: Ability to remain free from injury will improve Outcome: Progressing

## 2023-09-07 NOTE — Plan of Care (Signed)
  Problem: Education: Goal: Ability to describe self-care measures that may prevent or decrease complications (Diabetes Survival Skills Education) will improve Outcome: Progressing   Problem: Fluid Volume: Goal: Ability to maintain a balanced intake and output will improve Outcome: Progressing   Problem: Health Behavior/Discharge Planning: Goal: Ability to manage health-related needs will improve Outcome: Progressing   Problem: Metabolic: Goal: Ability to maintain appropriate glucose levels will improve Outcome: Progressing   Problem: Nutritional: Goal: Maintenance of adequate nutrition will improve Outcome: Progressing Goal: Progress toward achieving an optimal weight will improve Outcome: Progressing   Problem: Skin Integrity: Goal: Risk for impaired skin integrity will decrease Outcome: Progressing   Problem: Tissue Perfusion: Goal: Adequacy of tissue perfusion will improve Outcome: Progressing   Problem: Education: Goal: Knowledge of General Education information will improve Description: Including pain rating scale, medication(s)/side effects and non-pharmacologic comfort measures Outcome: Progressing   Problem: Health Behavior/Discharge Planning: Goal: Ability to manage health-related needs will improve Outcome: Progressing   Problem: Clinical Measurements: Goal: Ability to maintain clinical measurements within normal limits will improve Outcome: Progressing Goal: Will remain free from infection Outcome: Progressing Goal: Diagnostic test results will improve Outcome: Progressing Goal: Respiratory complications will improve Outcome: Progressing Goal: Cardiovascular complication will be avoided Outcome: Progressing   Problem: Activity: Goal: Risk for activity intolerance will decrease Outcome: Progressing   Problem: Nutrition: Goal: Adequate nutrition will be maintained Outcome: Progressing   Problem: Coping: Goal: Level of anxiety will  decrease Outcome: Progressing   Problem: Elimination: Goal: Will not experience complications related to bowel motility Outcome: Progressing Goal: Will not experience complications related to urinary retention Outcome: Progressing   Problem: Pain Managment: Goal: General experience of comfort will improve and/or be controlled Outcome: Progressing   Problem: Safety: Goal: Ability to remain free from injury will improve Outcome: Progressing   Problem: Skin Integrity: Goal: Risk for impaired skin integrity will decrease Outcome: Progressing   Problem: Clinical Measurements: Goal: Ability to avoid or minimize complications of infection will improve Outcome: Progressing   Problem: Skin Integrity: Goal: Skin integrity will improve Outcome: Progressing

## 2023-09-07 NOTE — Progress Notes (Signed)
 Patient has had a rough day- not tolerating mobility / position changes due to leg pain. Dressing change this shift was very difficult and intense for the patient and her pain was uncontrolled. We were only able to do the left leg dressing change before she said she could not tolerate any more today. I am worried patient is feeling discouraged in her progress and may be feeling depressed with her situation.

## 2023-09-07 NOTE — Care Management Important Message (Signed)
 Important Message  Patient Details  Name: [name] MRN: [id number] Date of Birth: [birth year]   Important Message Given:  Yes - Medicare IM     Cristela Blue, CMA 09/07/2023, 10:22 AM

## 2023-09-07 NOTE — Progress Notes (Addendum)
 Progress Note   Patient: [name] ZOX:[id number] DOB: 10/16/1955 DOA: 09/04/2023     3 DOS: the patient was seen and examined on 09/07/2023   Brief hospital course:   Assessment and Plan:  Chronic venous stasis wounds  Acute on chronic BLE pain  Patient with ulcerations of bilateral lower extremities. BLE are not WTT, erythema appears to be related to chronic venous insufficiency. Will hold on antiinfectives for now as patient had no leukocytosis/ fever on presentation and remains absent of these. Considered arterial insufficiency as possibly contributing to patients symptoms as she has a history of peripheral arterial procedure. Patient also noted to have uncontrolled diabetes and iron deficiency, which could be causing peripheral neuropathy/RLS but would not expect these to cause her acutely worsening pain. Finally, given patient is sedentary also considered DVT but venous duplex studies were negative for DVT.   Plan:  Will order arterial duplex studies  Continue PO iron supplementation  Start low dose gabapentin at night time   Chronic systolic HF  TTE 8119 with EF 14%. Trace to 1+  edema of BLE. Does not appear grossly volume overloaded. BNP WNL though patient is morbidly obese. -Lasix decreased due to worsening renal function. Will continue with decreased dose of lasix 20mg  daily   Acute renal insufficiency on CKD stage 3a/3b Last GFR ~1 year ago was 47. On admission 38. Possibly progression versus acute elevation in serum creatinine. Patient's serum creatinine 1.89 yesterday up from 1.47. Improved this AM with decreasing diuretic   -Continue to monitor with daily BMP -Continue lasix to 20mg  daily    Normocytic anemia  Iron/TIBC/Ferritin/ %Sat    Component Value Date/Time   IRON 18 (L) 09/05/2023 0254   TIBC 147 (L) 09/05/2023 0254   FERRITIN 107 09/05/2023 0254   IRONPCTSAT 12 09/05/2023 0254  Stable. Patient reports she has never had a colonoscopy. TSAT of 12% S/p  IV venofer. Continue PO supplementation    Thrombocytosis     Latest Ref Rng & Units 09/07/2023    7:23 AM 09/06/2023    5:36 AM 09/05/2023    2:56 AM  CBC  WBC 4.0 - 10.5 K/uL 8.2  10.2  10.4   Hemoglobin 12.0 - 15.0 g/dL 78.2  95.6  21.3   Hematocrit 36.0 - 46.0 % 32.3  34.6  32.4   Platelets 150 - 400 K/uL 550  662  565   Improving likely reactive.   T2DM A1c 8.1. Poorly controlled Improved this AM, continue LA at 10u +SSI  CBG (last 3)  Recent Labs    09/07/23 0820 09/07/23 1155 09/07/23 1801  GLUCAP 139* 172* 136*    HTN Controlled. 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 She continues to have deep aching pain in her LLE. She refused ABI due to leg pain.   Physical Exam: Vitals:   09/07/23 0814 09/07/23 0834 09/07/23 1604 09/07/23 1637  BP: 126/89 128/89 (!) 146/60 (!) 146/60  Pulse: (!) 109 (!) 109 87   Resp: 16  16   Temp: 98.2 F (36.8 C)  98.5 F (36.9 C)   TempSrc: Oral  Oral   SpO2: 100%  99%   Weight:      Height:          Physical  Exam  Constitutional: In no distress.  Cardiovascular: Normal rate, regular rhythm. Trace lower extremity edema  Pulmonary: Non labored breathing on room air, no wheezing or rales.   Abdominal: Soft. Normal bowel sounds. 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 BLE bandaged.   Neurological: Alert and oriented to person, place, and time.  Skin: Skin is warm and dry.   Data Reviewed:     Latest Ref Rng & Units 09/07/2023    7:23 AM 09/06/2023    5:36 AM 09/05/2023    2:56 AM  CBC  WBC 4.0 - 10.5 K/uL 8.2  10.2  10.4   Hemoglobin 12.0 - 15.0 g/dL 16.1  09.6  04.5   Hematocrit 36.0 - 46.0 % 32.3  34.6  32.4   Platelets 150 -  400 K/uL 550  662  565       Latest Ref Rng & Units 09/07/2023    7:23 AM 09/06/2023    5:36 AM 09/05/2023    2:56 AM  BMP  Glucose 70 - 99 mg/dL 409  811  914   BUN 8 - 23 mg/dL 30  26  23    Creatinine 0.44 - 1.00 mg/dL 7.82  9.56  2.13   Sodium 135 - 145 mmol/L 135  137  135   Potassium 3.5 - 5.1 mmol/L 4.4  4.0  4.3   Chloride 98 - 111 mmol/L 102  100  100   CO2 22 - 32 mmol/L 25  25  23    Calcium 8.9 - 10.3 mg/dL 8.5  8.9  8.7   '   Family Communication: None at bedside.   Disposition: Status is: Inpatient Remains inpatient appropriate because: Management of venous ulcer disease and lower extremity pain   Planned Discharge Destination:  Pending clinical course     Time spent: 35 minutes  Author: Marolyn Haller, MD 09/07/2023 6:05 PM  For on call review [email or web address].

## 2023-09-07 NOTE — Progress Notes (Signed)
 Received report. Patient alert in bed. Denies needs other than to adjust temperature.

## 2023-09-08 ENCOUNTER — Inpatient Hospital Stay

## 2023-09-08 ENCOUNTER — Encounter: Payer: Self-pay | Admitting: Internal Medicine

## 2023-09-08 DIAGNOSIS — I87333 Chronic venous hypertension (idiopathic) with ulcer and inflammation of bilateral lower extremity: Secondary | ICD-10-CM | POA: Diagnosis not present

## 2023-09-08 DIAGNOSIS — M79604 Pain in right leg: Secondary | ICD-10-CM | POA: Diagnosis not present

## 2023-09-08 DIAGNOSIS — M79605 Pain in left leg: Secondary | ICD-10-CM | POA: Diagnosis not present

## 2023-09-08 LAB — BASIC METABOLIC PANEL
Anion gap: 10 (ref 5–15)
BUN: 30 mg/dL — ABNORMAL HIGH (ref 8–23)
CO2: 24 mmol/L (ref 22–32)
Calcium: 8.7 mg/dL — ABNORMAL LOW (ref 8.9–10.3)
Chloride: 102 mmol/L (ref 98–111)
Creatinine, Ser: 1.41 mg/dL — ABNORMAL HIGH (ref 0.44–1.00)
GFR, Estimated: 40 mL/min — ABNORMAL LOW (ref >60)
Glucose, Bld: 116 mg/dL — ABNORMAL HIGH (ref 70–99)
Potassium: 4.2 mmol/L (ref 3.5–5.1)
Sodium: 136 mmol/L (ref 135–145)

## 2023-09-08 LAB — GLUCOSE, CAPILLARY
Glucose-Capillary: 106 mg/dL — ABNORMAL HIGH (ref 70–99)
Glucose-Capillary: 170 mg/dL — ABNORMAL HIGH (ref 70–99)
Glucose-Capillary: 194 mg/dL — ABNORMAL HIGH (ref 70–99)
Glucose-Capillary: 281 mg/dL — ABNORMAL HIGH (ref 70–99)

## 2023-09-08 LAB — CBC
HCT: 32 % — ABNORMAL LOW (ref 36.0–46.0)
Hemoglobin: 10 g/dL — ABNORMAL LOW (ref 12.0–15.0)
MCH: 26.8 pg (ref 26.0–34.0)
MCHC: 31.3 g/dL (ref 30.0–36.0)
MCV: 85.8 fL (ref 80.0–100.0)
Platelets: 490 10*3/uL — ABNORMAL HIGH (ref 150–400)
RBC: 3.73 MIL/uL — ABNORMAL LOW (ref 3.87–5.11)
RDW: 15.3 % (ref 11.5–15.5)
WBC: 8.9 10*3/uL (ref 4.0–10.5)
nRBC: 0 % (ref 0.0–0.2)

## 2023-09-08 LAB — MAGNESIUM: Magnesium: 2.2 mg/dL (ref 1.7–2.4)

## 2023-09-08 MED ORDER — GERHARDT'S BUTT CREAM
TOPICAL_CREAM | Freq: Every day | CUTANEOUS | Status: DC
  Filled 2023-09-08 (×2): qty 60

## 2023-09-08 MED ORDER — GADOBUTROL 1 MMOL/ML IV SOLN
10.0000 mL | Freq: Once | INTRAVENOUS | Status: AC | PRN
Start: 2023-09-08 — End: 2023-09-08
  Administered 2023-09-08: 10 mL via INTRAVENOUS

## 2023-09-08 NOTE — Progress Notes (Signed)
 Patient continues to refuse dressing change even after offering pain med prior to dressing change. Patient also refused to be repositioned for most of the night. Patient states "You all keep putting me in pain, I do not want to move" Patient only allowed day team to change dressing to left leg and will not allow this RN to change the other dressing.

## 2023-09-08 NOTE — Discharge Instructions (Signed)
 [facility] offers a [employer] that anyone can call toll free at [phone]. The friendship line is available 24 hours a day  [facility] is a Program of All-inclusive Care for the Elderly (PACE). Their mission is to promote and sustain the independence of seniors wishing to remain in the community. They provide seniors with comprehensive long-term health, social, medical and dietary care. Their program is a safe alternative to nursing home care. [phone]  [facility] Address [facility] [address] [place], [place] [address] Phone: [phone]. . Online zoom yoga class, connect with others without leaving your home [facility] offers [place] dance cardio sessions for individuals via Zoom. This program provides: - Dance fitness activities Please contact program for more information. Servinganyone in need adults 18+ hiv/aids individuals families Call [phone]  Email [email or web address] [email or web address] to get more info  [employer] offers an online [employer] to individuals where they can receive help to focus on their best health. Whether you're a [employer] member or not, the neighborhood center offers a... Main Serviceshealth education  exercise & fitness  community support services  recreation  virtual support Other Servicessupport groups Servinganyone in need adults young adults teens seniors individuals families [email or web address] to get more info  Schedule on their website  [facility] offers an array of activities for adults age 55 and over. This program provides:- Fitness and health programs- Tech classes- Activity books Main Serviceshealth education  community support services  exercise & fitness  recreation  more education Servingseniors  Call [phone]    For more resources go online to [email or web address] and type in you zipcode

## 2023-09-08 NOTE — Plan of Care (Signed)
  Problem: Education: Goal: Ability to describe self-care measures that may prevent or decrease complications (Diabetes Survival Skills Education) will improve Outcome: Progressing   Problem: Fluid Volume: Goal: Ability to maintain a balanced intake and output will improve Outcome: Progressing   Problem: Health Behavior/Discharge Planning: Goal: Ability to manage health-related needs will improve Outcome: Progressing   Problem: Metabolic: Goal: Ability to maintain appropriate glucose levels will improve Outcome: Progressing   Problem: Nutritional: Goal: Maintenance of adequate nutrition will improve Outcome: Progressing Goal: Progress toward achieving an optimal weight will improve Outcome: Progressing   Problem: Tissue Perfusion: Goal: Adequacy of tissue perfusion will improve Outcome: Progressing   Problem: Education: Goal: Knowledge of General Education information will improve Description: Including pain rating scale, medication(s)/side effects and non-pharmacologic comfort measures Outcome: Progressing   Problem: Health Behavior/Discharge Planning: Goal: Ability to manage health-related needs will improve Outcome: Progressing   Problem: Clinical Measurements: Goal: Ability to maintain clinical measurements within normal limits will improve Outcome: Progressing Goal: Will remain free from infection Outcome: Progressing Goal: Diagnostic test results will improve Outcome: Progressing Goal: Respiratory complications will improve Outcome: Progressing Goal: Cardiovascular complication will be avoided Outcome: Progressing   Problem: Nutrition: Goal: Adequate nutrition will be maintained Outcome: Progressing   Problem: Coping: Goal: Level of anxiety will decrease Outcome: Progressing   Problem: Elimination: Goal: Will not experience complications related to bowel motility Outcome: Progressing Goal: Will not experience complications related to urinary  retention Outcome: Progressing   Problem: Safety: Goal: Ability to remain free from injury will improve Outcome: Progressing   Problem: Skin Integrity: Goal: Risk for impaired skin integrity will decrease Outcome: Progressing   Problem: Skin Integrity: Goal: Skin integrity will improve Outcome: Progressing   Problem: Clinical Measurements: Goal: Ability to avoid or minimize complications of infection will improve Outcome: Progressing

## 2023-09-08 NOTE — TOC Initial Note (Signed)
 Transition of Care [facility]) - Initial/Assessment Note    Patient Details  Name: [name] MRN: [id number] Date of Birth: 1956-05-14  Transition of [facility]) CM/SW Contact:    Marlowe Sax, RN Phone Number: 09/08/2023, 1:57 PM  Clinical Narrative:                  Reaching out to [facility] Inquire if the patient is a long term patient there, awaiting a response, Added Social isolation SDOH resources to AVS       Patient Goals and CMS Choice            Expected Discharge Plan and Services                                              Prior Living Arrangements/Services                       Activities of Daily Living   ADL Screening (condition at time of admission) Independently performs ADLs?: No Does the patient have a NEW difficulty with bathing/dressing/toileting/self-feeding that is expected to last >3 days?: No Does the patient have a NEW difficulty with getting in/out of bed, walking, or climbing stairs that is expected to last >3 days?: No Does the patient have a NEW difficulty with communication that is expected to last >3 days?: No Is the patient deaf or have difficulty hearing?: No Does the patient have difficulty seeing, even when wearing glasses/contacts?: No Does the patient have difficulty concentrating, remembering, or making decisions?: No  Permission Sought/Granted                  Emotional Assessment              Admission diagnosis:  Wound infection [T14.8XXA, L08.9] Multiple open wounds of lower leg, initial encounter [S81.809A] Patient Active Problem List   Diagnosis Date Noted   Wound infection 09/04/2023   Type II diabetes mellitus with renal manifestations (HCC) 09/04/2023   Chronic systolic CHF (congestive heart failure) (HCC) 09/04/2023   Obesity, Class III, BMI 40-49.9 (morbid obesity) (HCC) 09/04/2023   DKA (diabetic ketoacidosis) (HCC) 10/30/2021   Stage 3a chronic kidney disease (CKD)  (HCC) 10/30/2021   PVD (peripheral vascular disease) (HCC)    Vertigo    Sepsis without acute organ dysfunction (HCC)    Acute kidney injury superimposed on CKD (HCC)    Adjustment disorder with anxiety 04/21/2021   Sepsis (HCC) 04/17/2021   Pressure injury of skin 04/17/2021   Left leg pain 04/16/2021   Morbid obesity (HCC) 10/24/2017   Chronic kidney disease, stage IV (severe) (HCC) 10/24/2017   Hypothyroidism 11/15/2016   Edema 11/15/2016   Type 2 diabetes mellitus with hyperlipidemia (HCC) 11/15/2016   Essential hypertension 12/09/2015   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) 06/26/2015   Pressure ulcer 05/31/2015   Primary osteoarthritis of right hip 05/28/2015   PCP:  Pcp, No Pharmacy:   [facility] [address]), [place] - [address] [place]) [place] [address] Phone: [phone] Fax: [phone]     Social Drivers of Health (SDOH) Social History: SDOH Screenings   Food Insecurity: No Food Insecurity (09/05/2023)  Housing: Low Risk  (09/05/2023)  Transportation Needs: No Transportation Needs (09/05/2023)  Utilities: Not At Risk (  09/05/2023)  Alcohol Screen: Low Risk  (03/10/2021)  Depression (PHQ2-9): Low Risk  (03/10/2021)  Financial Resource Strain: Low Risk  (03/10/2021)  Physical Activity: Inactive (03/10/2021)  Social Connections: Socially Isolated (09/05/2023)  Stress: No Stress Concern Present (03/10/2021)  Tobacco Use: Low Risk  (09/04/2023)   SDOH Interventions: Social Connections Interventions: Inpatient TOC   Readmission Risk Interventions    11/03/2021   12:41 PM  Readmission Risk Prevention Plan  Transportation Screening Complete  PCP or Specialist Appt within 3-5 Days Complete  HRI or Home Care Consult Complete  Social Work Consult for Recovery Care Planning/Counseling Complete  Palliative Care Screening Complete  Medication Review [employer])  Complete

## 2023-09-08 NOTE — Progress Notes (Signed)
 Progress Note   Patient: [name]:[id number] DOB: 1956/04/28 DOA: 09/04/2023     4 DOS: the patient was seen and examined on 09/08/2023   Brief hospital course:   Assessment and Plan:  Chronic venous stasis wounds  Acute on chronic BLE pain  Patient with multiple ulcerations of bilateral lower extremities. BLE are bandaged currently but recent pictures of her LE, her wounds do not appear infected and are likely related to chronic venous insufficiency.  Will continue to hold antiinfectives at this time. Patient also remains afebrile and without a leukocytosis.   Considered arterial insufficiency as possibly contributing to patients symptoms as she has a history of peripheral arterial disease . She underwent lower extremity angiography of the L lower extremity and was noted to have moderate stenosis of popliteal artery and underwent angioplasty of these lesions. Because she described a deep aching pain of her LLE ABI were ordered. Patient initially refused this test, but is amenable to lower extremity arterial duplex studies.   Also considered that patient has an infection of the deeper tissues versus possible soft tissue injury, given her description of deep aching pain.  An MRI of the LLE was ordered to further evaluate for this.   DVT was also considered and venous duplex studies of bilateral lower extremities were negative for DVT.   Finally, patient also noted to have uncontrolled diabetes and iron deficiency, which could be causing peripheral neuropathy/RLS but would not expect these to cause an acute worsening in her pain.   Plan:  F/u arterial duplex studies  F/u MRI of L lower extremity  Continue PO iron supplementation  Continue low dose gabapentin at night time (judicious with dosing given chronic renal insufficiency) If these studies are unrevealing, discussed with patient that she could return to her facility for continued wound care and pain management.     Chronic systolic HF  TTE 8119 with EF 14%. Exam is unchanged.  -Continue with decreased dose of lasix 20mg  daily   Acute renal insufficiency on CKD stage 3a/3b Last GFR ~1 year ago was 47. On admission 38. Possibly has progression of her chronic renal sufficiency. Noted to have acute worsening of her kidney function that is now improved with decreasing her lasix dose.  -Continue to monitor with daily BMP -Continue lasix to 20mg  daily    Normocytic anemia  Iron/TIBC/Ferritin/ %Sat    Component Value Date/Time   IRON 18 (L) 09/05/2023 0254   TIBC 147 (L) 09/05/2023 0254   FERRITIN 107 09/05/2023 0254   IRONPCTSAT 12 09/05/2023 0254  Stable. Patient reports she has never had a colonoscopy. TSAT of 12% S/p IV venofer. Continue PO supplementation    Thrombocytosis     Latest Ref Rng & Units 09/08/2023    5:32 AM 09/07/2023    7:23 AM 09/06/2023    5:36 AM  CBC  WBC 4.0 - 10.5 K/uL 8.9  8.2  10.2   Hemoglobin 12.0 - 15.0 g/dL 78.2  95.6  21.3   Hematocrit 36.0 - 46.0 % 32.0  32.3  34.6   Platelets 150 - 400 K/uL 490  550  662   Improving likely reactive.   T2DM A1c 8.1. Poorly controlled Continue LA at 10u +SSI  CBG (last 3)  Recent Labs    09/07/23 2247 09/08/23 0726 09/08/23 1205  GLUCAP 144* 106* 170*    HTN Controlled. Continue home medications   Hypothyroidism  Continue Synthroid    Morbid obesity, class III BMI 48  PAD Conitnue plavix, F/u arterial duplex studies      Subjective: Patient states when she does not move the pain in her left leg is minimal. With minimal activity she feels the deep aching pain that prompted her to present to the ED. She does note that it is not as severe as it was on presentation. Her RLE pain is improved.    Physical Exam: Vitals:   09/07/23 2132 09/08/23 0232 09/08/23 0320 09/08/23 0729  BP: (!) 131/56 125/64 (!) 129/50 (!) 145/60  Pulse: 87 74 (!) 58 82  Resp:  15 16 16   Temp: 98.3 F (36.8 C) 98 F (36.7 C)  97.7 F (36.5 C) 97.9 F (36.6 C)  TempSrc: Oral Oral Oral   SpO2: 97% 95% 100% 98%  Weight:      Height:        Physical Exam  Constitutional: In no distress.  Cardiovascular: Normal rate, regular rhythm. No lower extremity edema  Pulmonary: Non labored breathing on room air, no wheezing or rales.  Abdominal: Soft. Normal bowel sounds. 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 BLE bandaged.   Neurological: Alert and oriented to person, place, and time. Non focal    Data Reviewed:     Latest Ref Rng & Units 09/08/2023    5:32 AM 09/07/2023    7:23 AM 09/06/2023    5:36 AM  CBC  WBC 4.0 - 10.5 K/uL 8.9  8.2  10.2   Hemoglobin 12.0 - 15.0 g/dL 81.1  91.4  78.2   Hematocrit 36.0 - 46.0 % 32.0  32.3  34.6   Platelets 150 - 400 K/uL 490  550  662       Latest Ref Rng & Units 09/08/2023    5:32 AM 09/07/2023    7:23 AM 09/06/2023    5:36 AM  BMP  Glucose 70 - 99 mg/dL 956  213  086   BUN 8 - 23 mg/dL 30  30  26    Creatinine 0.44 - 1.00 mg/dL 5.78  4.69  6.29   Sodium 135 - 145 mmol/L 136  135  137   Potassium 3.5 - 5.1 mmol/L 4.2  4.4  4.0   Chloride 98 - 111 mmol/L 102  102  100   CO2 22 - 32 mmol/L 24  25  25    Calcium 8.9 - 10.3 mg/dL 8.7  8.5  8.9   '   Family Communication: None at bedside.   Disposition: Status is: Inpatient Remains inpatient appropriate because: Management of venous ulcer disease and lower extremity pain   Planned Discharge Destination:  Pending clinical course     Time spent: 35 minutes  Author: Marolyn Haller, MD 09/08/2023 1:42 PM  For on call review [email or web address].

## 2023-09-08 NOTE — Progress Notes (Signed)
 Notified patient at 9:30pm of MRI being scheduled tonight. Patient states she is hurting too bad. Explained to patient the importance of the MRI and stated I will give her something for pain before the MRI. Patient states she will only do IV pain medication. Transport down to pick up patient at 1045pm. Entered patients room to give IV pain medication. Patient begins yelling, this is ridiculous this time of time. Explained to patient that I informed her and she agreed when MRI had called. Told her that the MRI was important to finding out why she was having pain in he left leg. Transported to MRI. MRI called and informed that patient was refusing to have done, transport then spoke with patient and patient agreed to do MRI.

## 2023-09-09 DIAGNOSIS — Z794 Long term (current) use of insulin: Secondary | ICD-10-CM

## 2023-09-09 DIAGNOSIS — M86262 Subacute osteomyelitis, left tibia and fibula: Secondary | ICD-10-CM | POA: Diagnosis not present

## 2023-09-09 DIAGNOSIS — N179 Acute kidney failure, unspecified: Secondary | ICD-10-CM | POA: Diagnosis not present

## 2023-09-09 DIAGNOSIS — M869 Osteomyelitis, unspecified: Secondary | ICD-10-CM

## 2023-09-09 DIAGNOSIS — I10 Essential (primary) hypertension: Secondary | ICD-10-CM | POA: Diagnosis not present

## 2023-09-09 DIAGNOSIS — N189 Chronic kidney disease, unspecified: Secondary | ICD-10-CM

## 2023-09-09 DIAGNOSIS — I5022 Chronic systolic (congestive) heart failure: Secondary | ICD-10-CM | POA: Diagnosis not present

## 2023-09-09 DIAGNOSIS — E1122 Type 2 diabetes mellitus with diabetic chronic kidney disease: Secondary | ICD-10-CM

## 2023-09-09 LAB — CBC
HCT: 32.9 % — ABNORMAL LOW (ref 36.0–46.0)
Hemoglobin: 10.1 g/dL — ABNORMAL LOW (ref 12.0–15.0)
MCH: 26.4 pg (ref 26.0–34.0)
MCHC: 30.7 g/dL (ref 30.0–36.0)
MCV: 86.1 fL (ref 80.0–100.0)
Platelets: 626 10*3/uL — ABNORMAL HIGH (ref 150–400)
RBC: 3.82 MIL/uL — ABNORMAL LOW (ref 3.87–5.11)
RDW: 15.6 % — ABNORMAL HIGH (ref 11.5–15.5)
WBC: 9.8 10*3/uL (ref 4.0–10.5)
nRBC: 0 % (ref 0.0–0.2)

## 2023-09-09 LAB — CULTURE, BLOOD (ROUTINE X 2)
Culture: NO GROWTH
Culture: NO GROWTH

## 2023-09-09 LAB — BASIC METABOLIC PANEL
Anion gap: 10 (ref 5–15)
BUN: 37 mg/dL — ABNORMAL HIGH (ref 8–23)
CO2: 23 mmol/L (ref 22–32)
Calcium: 8.7 mg/dL — ABNORMAL LOW (ref 8.9–10.3)
Chloride: 101 mmol/L (ref 98–111)
Creatinine, Ser: 1.66 mg/dL — ABNORMAL HIGH (ref 0.44–1.00)
GFR, Estimated: 33 mL/min — ABNORMAL LOW (ref >60)
Glucose, Bld: 234 mg/dL — ABNORMAL HIGH (ref 70–99)
Potassium: 4.3 mmol/L (ref 3.5–5.1)
Sodium: 134 mmol/L — ABNORMAL LOW (ref 135–145)

## 2023-09-09 LAB — GLUCOSE, CAPILLARY
Glucose-Capillary: 183 mg/dL — ABNORMAL HIGH (ref 70–99)
Glucose-Capillary: 192 mg/dL — ABNORMAL HIGH (ref 70–99)
Glucose-Capillary: 151 mg/dL — ABNORMAL HIGH (ref 70–99)
Glucose-Capillary: 215 mg/dL — ABNORMAL HIGH (ref 70–99)

## 2023-09-09 MED ORDER — HYDROMORPHONE HCL 1 MG/ML IJ SOLN
0.5000 mg | Freq: Once | INTRAMUSCULAR | Status: AC
  Administered 2023-09-09: 0.5 mg via INTRAVENOUS
  Filled 2023-09-09: qty 0.5

## 2023-09-09 MED ORDER — LINEZOLID 600 MG/300ML IV SOLN
600.0000 mg | Freq: Two times a day (BID) | INTRAVENOUS | Status: DC
  Administered 2023-09-09 – 2023-09-12 (×7): 600 mg via INTRAVENOUS
  Filled 2023-09-09 (×7): qty 300

## 2023-09-09 MED ORDER — SODIUM CHLORIDE 0.9 % IV SOLN
2.0000 g | INTRAVENOUS | Status: DC
  Administered 2023-09-09 – 2023-09-10 (×2): 2 g via INTRAVENOUS
  Filled 2023-09-09 (×3): qty 20

## 2023-09-09 NOTE — Assessment & Plan Note (Signed)
 On Plavix

## 2023-09-09 NOTE — Progress Notes (Signed)
 Progress Note   Patient: [name] ONG:[id number] DOB: [birth year] DOA: 09/04/2023     5 DOS: the patient was seen and examined on 09/09/2023   Brief hospital course: 68 year old female past medical history of chronic wounds bilateral legs, systolic congestive heart failure with EF 45%, chronic lymphedema, hypertension, type 2 diabetes mellitus, peripheral vascular disease, hypothyroidism, CKD stage IIIa presents with leg wounds and leg pain.  Patient has chronic wounds both legs since 2022 and following up at wound care.  Patient admitted to the hospital on 09/04/2023.  Looks like initially started on antibiotics but then discontinued.  ABI negative.  Sedimentation rate greater than 140  3/15.  MRI left leg reviewed showing osteomyelitis.  Case discussed with infectious disease specialist and will start Rocephin and Zyvox.  Assessment and Plan: * Osteomyelitis of left fibula (HCC) Blood cultures negative for 5 days.  Will get a wound culture.  MRI resulted today consistent with osteomyelitis left leg.  Case discussed with infectious disease specialist.  Will start IV Zyvox and IV Rocephin.  ESR on admission greater than 140.  Acute kidney injury superimposed on CKD (HCC) AKI on CKD stage IIIa.  Creatinine on presentation 1.47.  Creatinine peaked at 1.89 on 3/12.  Today's creatinine 1.66  Chronic systolic CHF (congestive heart failure) (HCC) Patient on low-dose Toprol and Lasix.  EF 45% on last echo in chart back in 2018.  Type II diabetes mellitus with renal manifestations (HCC) CKD stage IIIa.  Patient on Semglee insulin 10 units twice a day with sliding scale  Essential hypertension Patient on Lasix, metoprolol, clonidine and Norvasc  Hypothyroidism On Synthroid  PVD (peripheral vascular disease) (HCC) On Plavix  Obesity, Class III, BMI 40-49.9 (morbid obesity) (HCC) BMI 48.76       Subjective: Patient admitted to the hospital with bilateral lower extremity wounds.   Initially thought to be venous stasis ulcers but MRI today resulting as osteomyelitis of the left fibula.  Physical Exam: Vitals:   09/08/23 1549 09/08/23 2027 09/09/23 0444 09/09/23 0737  BP: (!) 113/45 (!) 115/44 (!) 132/94 138/64  Pulse: 72 68 (!) 102 79  Resp: 16 18 18 16   Temp: 98.2 F (36.8 C) 98 F (36.7 C) 98 F (36.7 C) 98.7 F (37.1 C)  TempSrc:      SpO2: 97% 96% 100% 96%  Weight:      Height:       Physical Exam HENT:     Head: Normocephalic.  Eyes:     General: Lids are normal.     Conjunctiva/sclera: Conjunctivae normal.  Cardiovascular:     Rate and Rhythm: Normal rate and regular rhythm.     Heart sounds: Normal heart sounds, S1 normal and S2 normal.  Pulmonary:     Breath sounds: No decreased breath sounds, wheezing, rhonchi or rales.  Abdominal:     Palpations: Abdomen is soft.     Tenderness: There is no abdominal tenderness.  Musculoskeletal:     Right upper leg: Swelling present.     Left upper leg: Swelling present.  Skin:    Comments: See pictures below from yesterday.  Neurological:     Mental Status: She is alert and oriented to person, place, and time.            Data Reviewed: High-grade lateral left calf soft tissue ulceration that extends to the lateral fibula and the proximal access of the fibular diaphyses through the distal fibula diaphysis.  Acute osteomyelitis of the lateral fibular  diaphyses through this region  Sodium 134, creatinine 1.66, white blood count 9.8, hemoglobin 10.1, platelet count 626.   Disposition: Status is: Inpatient Remains inpatient appropriate because: Diagnosed with osteomyelitis today.  Will get a wound culture and start empiric Zyvox and Rocephin and consult infectious disease specialist.  Planned Discharge Destination: Long-term care    Time spent: 28 minutes  Author: Alford Highland, MD 09/09/2023 3:10 PM  For on call review [email or web address].

## 2023-09-09 NOTE — Progress Notes (Signed)
 PT Cancellation Note  Patient Details Name: [name] MRN: [id number] DOB: Aug 17, 1955   Cancelled Treatment:    Reason Eval/Treat Not Completed: PT screened, no needs identified, will sign off Per chart, pt is from LTC facility & refuses OOB mobility while there. Pt does not require acute PT services at this time. PT to complete current order.  Aleda Grana, PT, DPT 09/09/23, 1:38 PM   Sandi Mariscal 09/09/2023, 1:37 PM

## 2023-09-09 NOTE — Assessment & Plan Note (Signed)
 BMI 48.76

## 2023-09-09 NOTE — Assessment & Plan Note (Deleted)
 Continue to monitor

## 2023-09-09 NOTE — Assessment & Plan Note (Signed)
 Patient on low-dose Toprol and Lasix.  EF 45% on last echo in chart back in 2018.

## 2023-09-09 NOTE — Assessment & Plan Note (Addendum)
 CKD stage IIIa.  Patient on Semglee insulin 12 units twice a day with sliding scale

## 2023-09-09 NOTE — Assessment & Plan Note (Signed)
 On Synthroid

## 2023-09-09 NOTE — Assessment & Plan Note (Addendum)
 AKI on CKD stage IIIa.  Creatinine on presentation 1.47.  Creatinine peaked at 1.89 on 3/12.  Today's creatinine 1.66

## 2023-09-09 NOTE — Plan of Care (Signed)
   Problem: Nutritional: Goal: Maintenance of adequate nutrition will improve Outcome: Progressing

## 2023-09-09 NOTE — Assessment & Plan Note (Addendum)
 Patient on Lasix, metoprolol, clonidine and Norvasc.  Will decrease dose of clonidine down to 0.2 mg twice a day.

## 2023-09-09 NOTE — Assessment & Plan Note (Addendum)
 MRI resulted on 3/15 consistent with osteomyelitis left leg.  Wound culture on 3/15 growing gram-positive cocci in pairs and few gram variable rods. Continue IV Zyvox and IV Rocephin.  ESR on admission greater than 140.  Blood cultures negative for 5 days.  Infectious disease specialist to determine treatment course.

## 2023-09-10 DIAGNOSIS — N179 Acute kidney failure, unspecified: Secondary | ICD-10-CM | POA: Diagnosis not present

## 2023-09-10 DIAGNOSIS — I5022 Chronic systolic (congestive) heart failure: Secondary | ICD-10-CM | POA: Diagnosis not present

## 2023-09-10 DIAGNOSIS — E1121 Type 2 diabetes mellitus with diabetic nephropathy: Secondary | ICD-10-CM

## 2023-09-10 DIAGNOSIS — D75839 Thrombocytosis, unspecified: Secondary | ICD-10-CM

## 2023-09-10 DIAGNOSIS — M86262 Subacute osteomyelitis, left tibia and fibula: Secondary | ICD-10-CM | POA: Diagnosis not present

## 2023-09-10 DIAGNOSIS — D638 Anemia in other chronic diseases classified elsewhere: Secondary | ICD-10-CM

## 2023-09-10 LAB — GLUCOSE, CAPILLARY
Glucose-Capillary: 180 mg/dL — ABNORMAL HIGH (ref 70–99)
Glucose-Capillary: 176 mg/dL — ABNORMAL HIGH (ref 70–99)
Glucose-Capillary: 168 mg/dL — ABNORMAL HIGH (ref 70–99)
Glucose-Capillary: 281 mg/dL — ABNORMAL HIGH (ref 70–99)

## 2023-09-10 MED ORDER — CLONIDINE HCL 0.1 MG PO TABS
0.2000 mg | ORAL_TABLET | Freq: Three times a day (TID) | ORAL | Status: DC
  Administered 2023-09-10 – 2023-09-15 (×16): 0.2 mg via ORAL
  Filled 2023-09-10 (×16): qty 2

## 2023-09-10 NOTE — Progress Notes (Addendum)
 There was a consult for placing a PIV access- patient c/o pain on hand. Patient denied pain at this time, but at the beginning flushed the line and infused antibiotics, she felt pain. Assessed PIV access, no evidence of infiltration. Assessed Lt. Lower arm and upper arm by [name], veins were small and deep, some veins were 2/3 compressible. Cephalic vein on upper arm was 50% occupancy ( suppose to be less than 45%), basilic vein on upper arm was more than 3 cm deep, brachial vein on upper arm were too deep with no straight. Assessed Rt. Lower arm by [name], veins were small and deep. Rt. Upper cephalic vein was small. Informed patient's RN regarding this finding. Recommended central line. If want to  put Midline or PICC line, need to be clear by nephrology team due to GFR less than 35. HS [employer]

## 2023-09-10 NOTE — Progress Notes (Signed)
 Progress Note   Patient: [name]:[id number] DOB: 07-25-1955 DOA: 09/04/2023     6 DOS: the patient was seen and examined on 09/10/2023   Brief hospital course: 68 year old female past medical history of chronic wounds bilateral legs, systolic congestive heart failure with EF 45%, chronic lymphedema, hypertension, type 2 diabetes mellitus, peripheral vascular disease, hypothyroidism, CKD stage IIIa presents with leg wounds and leg pain.  Patient has chronic wounds both legs since 2022 and following up at wound care.  Patient admitted to the hospital on 09/04/2023.  Looks like initially started on antibiotics but then discontinued.  ABI negative.  Sedimentation rate greater than 140  3/15.  MRI left leg reviewed showing osteomyelitis.  Case discussed with infectious disease specialist and will start Rocephin and Zyvox. 3/16.  Wound culture as a few gram-positive cocci in pairs and a few gram variable rod.  Assessment and Plan: * Osteomyelitis of left fibula (HCC) MRI resulted on 3/15 consistent with osteomyelitis left leg.  Wound culture on 3/15 growing gram-positive cocci in pairs and few gram variable rods.  Case discussed with infectious disease specialist to determine antibiotic course.  Continue IV Zyvox and IV Rocephin.  ESR on admission greater than 140.  Blood cultures negative for 5 days.  Acute kidney injury superimposed on CKD (HCC) AKI on CKD stage IIIa.  Creatinine on presentation 1.47.  Creatinine peaked at 1.89 on 3/12.  Last creatinine 1.66  Chronic systolic CHF (congestive heart failure) (HCC) Patient on low-dose Toprol and Lasix.  EF 45% on last echo in chart back in 2018.  Type II diabetes mellitus with renal manifestations (HCC) CKD stage IIIa.  Patient on Semglee insulin 10 units twice a day with sliding scale  Essential hypertension Patient on Lasix, metoprolol, clonidine and Norvasc.  Will decrease dose of clonidine down to 0.2 mg twice a  day.  Hypothyroidism On Synthroid  PVD (peripheral vascular disease) (HCC) On Plavix  Obesity, Class III, BMI 40-49.9 (morbid obesity) (HCC) BMI 48.76  Anemia of chronic disease Ferritin 107 and last hemoglobin 10.1.  Thrombocytosis Likely secondary to osteomyelitis.    Subjective: Patient wondering how she got the infection.  Patient has been bedbound for quite a while.  Refused working with physical therapy yesterday.  Admitted with leg infection and found to have osteomyelitis.  Physical Exam: Vitals:   09/09/23 1509 09/09/23 2045 09/10/23 0451 09/10/23 0832  BP: 131/63 (!) 112/51 (!) 109/52 (!) 128/55  Pulse: 70 65 63 65  Resp: 17 20 16 16   Temp: 98 F (36.7 C) 97.9 F (36.6 C) 97.7 F (36.5 C) 97.6 F (36.4 C)  TempSrc:  Oral Oral   SpO2: 100%  99% 100%  Weight:      Height:       Physical Exam HENT:     Head: Normocephalic.  Eyes:     General: Lids are normal.     Conjunctiva/sclera: Conjunctivae normal.  Cardiovascular:     Rate and Rhythm: Normal rate and regular rhythm.     Heart sounds: Normal heart sounds, S1 normal and S2 normal.  Pulmonary:     Breath sounds: No decreased breath sounds, wheezing, rhonchi or rales.  Abdominal:     Palpations: Abdomen is soft.     Tenderness: There is no abdominal tenderness.  Musculoskeletal:     Right upper leg: Swelling present.     Left upper leg: Swelling present.  Skin:    Comments: Legs covered  Neurological:  Mental Status: She is alert and oriented to person, place, and time.     Data Reviewed: Last creatinine 1.66  Family Communication: Spoke with her primary contact Mrs. Loy on the phone  Disposition: Status is: Inpatient Remains inpatient appropriate because: Started on IV antibiotics on 3/15 for osteomyelitis.  Wound culture sent off on 3/15.  Consulted ID to set up treatment plan.  Planned Discharge Destination: Long-term care    Time spent: 28 minutes  Author: Alford Highland,  MD 09/10/2023 11:46 AM  For on call review [email or web address].

## 2023-09-10 NOTE — Plan of Care (Signed)
  Problem: Education: Goal: Ability to describe self-care measures that may prevent or decrease complications (Diabetes Survival Skills Education) will improve Outcome: Progressing   Problem: Fluid Volume: Goal: Ability to maintain a balanced intake and output will improve Outcome: Progressing   Problem: Health Behavior/Discharge Planning: Goal: Ability to manage health-related needs will improve Outcome: Progressing   Problem: Metabolic: Goal: Ability to maintain appropriate glucose levels will improve Outcome: Progressing   Problem: Nutritional: Goal: Maintenance of adequate nutrition will improve Outcome: Progressing Goal: Progress toward achieving an optimal weight will improve Outcome: Progressing   Problem: Skin Integrity: Goal: Risk for impaired skin integrity will decrease Outcome: Progressing   Problem: Tissue Perfusion: Goal: Adequacy of tissue perfusion will improve Outcome: Progressing   Problem: Education: Goal: Knowledge of General Education information will improve Description: Including pain rating scale, medication(s)/side effects and non-pharmacologic comfort measures Outcome: Progressing   Problem: Health Behavior/Discharge Planning: Goal: Ability to manage health-related needs will improve Outcome: Progressing   Problem: Clinical Measurements: Goal: Ability to maintain clinical measurements within normal limits will improve Outcome: Progressing Goal: Will remain free from infection Outcome: Progressing Goal: Diagnostic test results will improve Outcome: Progressing Goal: Respiratory complications will improve Outcome: Progressing Goal: Cardiovascular complication will be avoided Outcome: Progressing   Problem: Activity: Goal: Risk for activity intolerance will decrease Outcome: Progressing   Problem: Nutrition: Goal: Adequate nutrition will be maintained Outcome: Progressing   Problem: Coping: Goal: Level of anxiety will  decrease Outcome: Progressing   Problem: Elimination: Goal: Will not experience complications related to bowel motility Outcome: Progressing Goal: Will not experience complications related to urinary retention Outcome: Progressing   Problem: Pain Managment: Goal: General experience of comfort will improve and/or be controlled Outcome: Progressing   Problem: Safety: Goal: Ability to remain free from injury will improve Outcome: Progressing   Problem: Skin Integrity: Goal: Risk for impaired skin integrity will decrease Outcome: Progressing   Problem: Clinical Measurements: Goal: Ability to avoid or minimize complications of infection will improve Outcome: Progressing   Problem: Skin Integrity: Goal: Skin integrity will improve Outcome: Progressing

## 2023-09-10 NOTE — Assessment & Plan Note (Signed)
 Ferritin 107 and last hemoglobin 9.7.

## 2023-09-10 NOTE — Assessment & Plan Note (Signed)
 Likely secondary to osteomyelitis.

## 2023-09-11 DIAGNOSIS — M86162 Other acute osteomyelitis, left tibia and fibula: Secondary | ICD-10-CM | POA: Diagnosis not present

## 2023-09-11 DIAGNOSIS — E1121 Type 2 diabetes mellitus with diabetic nephropathy: Secondary | ICD-10-CM | POA: Diagnosis not present

## 2023-09-11 DIAGNOSIS — I5022 Chronic systolic (congestive) heart failure: Secondary | ICD-10-CM | POA: Diagnosis not present

## 2023-09-11 DIAGNOSIS — I878 Other specified disorders of veins: Secondary | ICD-10-CM | POA: Diagnosis not present

## 2023-09-11 DIAGNOSIS — N179 Acute kidney failure, unspecified: Secondary | ICD-10-CM | POA: Diagnosis not present

## 2023-09-11 DIAGNOSIS — I739 Peripheral vascular disease, unspecified: Secondary | ICD-10-CM | POA: Diagnosis not present

## 2023-09-11 DIAGNOSIS — I872 Venous insufficiency (chronic) (peripheral): Secondary | ICD-10-CM

## 2023-09-11 DIAGNOSIS — L97226 Non-pressure chronic ulcer of left calf with bone involvement without evidence of necrosis: Secondary | ICD-10-CM

## 2023-09-11 DIAGNOSIS — M86262 Subacute osteomyelitis, left tibia and fibula: Secondary | ICD-10-CM | POA: Diagnosis not present

## 2023-09-11 LAB — GLUCOSE, CAPILLARY
Glucose-Capillary: 172 mg/dL — ABNORMAL HIGH (ref 70–99)
Glucose-Capillary: 234 mg/dL — ABNORMAL HIGH (ref 70–99)
Glucose-Capillary: 190 mg/dL — ABNORMAL HIGH (ref 70–99)
Glucose-Capillary: 311 mg/dL — ABNORMAL HIGH (ref 70–99)

## 2023-09-11 MED ORDER — INSULIN GLARGINE-YFGN 100 UNIT/ML ~~LOC~~ SOLN
12.0000 [IU] | Freq: Two times a day (BID) | SUBCUTANEOUS | Status: DC
  Administered 2023-09-11 – 2023-09-13 (×4): 12 [IU] via SUBCUTANEOUS
  Filled 2023-09-11 (×5): qty 0.12

## 2023-09-11 MED ORDER — SODIUM CHLORIDE 0.9 % IV SOLN
2.0000 g | Freq: Two times a day (BID) | INTRAVENOUS | Status: DC
  Administered 2023-09-11 – 2023-09-15 (×8): 2 g via INTRAVENOUS
  Filled 2023-09-11 (×9): qty 12.5

## 2023-09-11 NOTE — Progress Notes (Signed)
 Progress Note   Patient: [name] ZOX:[id number] DOB: 1955/09/16 DOA: 09/04/2023     7 DOS: the patient was seen and examined on 09/11/2023   Brief hospital course: 68 year old female past medical history of chronic wounds bilateral legs, systolic congestive heart failure with EF 45%, chronic lymphedema, hypertension, type 2 diabetes mellitus, peripheral vascular disease, hypothyroidism, CKD stage IIIa presents with leg wounds and leg pain.  Patient has chronic wounds both legs since 2022 and following up at wound care.  Patient admitted to the hospital on 09/04/2023.  Looks like initially started on antibiotics but then discontinued.  ABI negative.  Sedimentation rate greater than 140  3/15.  MRI left leg reviewed showing osteomyelitis.  Case discussed with infectious disease specialist and will start Rocephin and Zyvox. 3/16.  Wound culture as a few gram-positive cocci in pairs and a few gram variable rod. 3/17. wound culture still showing gram-positive cocci in pairs and gram variable rod  Assessment and Plan: * Osteomyelitis of left fibula (HCC) MRI resulted on 3/15 consistent with osteomyelitis left leg.  Wound culture on 3/15 growing gram-positive cocci in pairs and few gram variable rods. Continue IV Zyvox and IV Rocephin.  ESR on admission greater than 140.  Blood cultures negative for 5 days.  Infectious disease specialist to determine treatment course.  Acute kidney injury superimposed on CKD (HCC) AKI on CKD stage IIIa.  Creatinine on presentation 1.47.  Creatinine peaked at 1.89 on 3/12.  Last creatinine 1.66  Chronic systolic CHF (congestive heart failure) (HCC) Patient on low-dose Toprol and Lasix.  EF 45% on last echo in chart back in 2018.  Type II diabetes mellitus with renal manifestations (HCC) CKD stage IIIa.  Patient on Semglee insulin 10 units twice a day with sliding scale  Essential hypertension Patient on Lasix, metoprolol, clonidine (decreased dose) and  Norvasc.  Hypothyroidism On Synthroid  PVD (peripheral vascular disease) (HCC) On Plavix  Obesity, Class III, BMI 40-49.9 (morbid obesity) (HCC) BMI 48.76  Anemia of chronic disease Ferritin 107 and last hemoglobin 10.1.  Thrombocytosis Likely secondary to osteomyelitis.       Subjective: Patient upset that she could not order a quesadilla with chicken because she is on a restricted diet.  Wants diet to be changed to regular.  Admitted 6 days ago with leg infection found to have osteomyelitis.  Physical Exam: Vitals:   09/10/23 1637 09/10/23 2015 09/11/23 0443 09/11/23 0800  BP: (!) 164/58 (!) 143/55 (!) 136/54 (!) 136/54  Pulse: 87 72 68 72  Resp: 17 19 16 16   Temp: 98 F (36.7 C) 98.9 F (37.2 C) 97.9 F (36.6 C) 98.3 F (36.8 C)  TempSrc:      SpO2: 99% 96% 97% 99%  Weight:      Height:       Physical Exam HENT:     Head: Normocephalic.  Eyes:     General: Lids are normal.     Conjunctiva/sclera: Conjunctivae normal.  Cardiovascular:     Rate and Rhythm: Normal rate and regular rhythm.     Heart sounds: Normal heart sounds, S1 normal and S2 normal.  Pulmonary:     Breath sounds: No decreased breath sounds, wheezing, rhonchi or rales.  Abdominal:     Palpations: Abdomen is soft.     Tenderness: There is no abdominal tenderness.  Musculoskeletal:     Right upper leg: Swelling present.     Left upper leg: Swelling present.  Skin:    Comments: Legs  covered  Neurological:     Mental Status: She is alert and oriented to person, place, and time.     Data Reviewed: Urine culture showing gram-positive cocci in pairs and gram variable rods  Family Communication: Spoke with friend Mrs. Loy  Disposition: Status is: Inpatient Remains inpatient appropriate because: IV antibiotics for osteomyelitis  Planned Discharge Destination: Long term Care    Time spent: 27 minutes  Author: Alford Highland, MD 09/11/2023 12:10 PM  For on call review  [email or web address].

## 2023-09-11 NOTE — Plan of Care (Signed)
  Problem: Education: Goal: Ability to describe self-care measures that may prevent or decrease complications (Diabetes Survival Skills Education) will improve Outcome: Progressing   Problem: Fluid Volume: Goal: Ability to maintain a balanced intake and output will improve Outcome: Progressing   Problem: Health Behavior/Discharge Planning: Goal: Ability to manage health-related needs will improve Outcome: Progressing   Problem: Metabolic: Goal: Ability to maintain appropriate glucose levels will improve Outcome: Progressing

## 2023-09-11 NOTE — Consult Note (Signed)
 NAME: [name]  DOB: 06/14/1956  MRN: [id number]  Date/Time: 09/11/2023 3:17 PM  REQUESTING PROVIDER: Dr.Wieting Subjective:  REASON FOR CONSULT: osteomyelitis left fibula ?pt is a poor historian  [name] is a 68 y.o. 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,  presents from SNF with worsening leg wounds and leg pain Pt has had chronic wounds since 2022, she is not ambulatory Vitals in the ED 123/69, Temp 98.3, pulse 94, sats 100%  Latest Reference Range & Units 09/05/23 02:56  WBC 4.0 - 10.5 K/uL 10.4  Hemoglobin 12.0 - 15.0 g/dL 78.2 (L)  HCT 95.6 - 21.3 % 32.4 (L)  Platelets 150 - 400 K/uL 565 (H)  Creatinine 0.44 - 1.00 mg/dL 0.86 (H)   Pt initially was started on antibiotics and it was stopped as no fever and leucocytosis- as she was having deep pain in the leg MRI was done and it showed osteomyelitis of the fibula and I am asked to see the patient for the same    Past Medical History:  Diagnosis Date   Diabetes mellitus without complication (HCC) 2010   Hypertension    Hypothyroidism    Kidney stone 11/2014   history of/STAGE 4 KIDNEY DISEASE PER DR Wynelle Link   Lymphedema    Multiple open wounds of lower leg    PT BEING SEEN BY THE WOUND CENTER FOR CHRONIC BLISTERS PER PT    Past Surgical History:  Procedure Laterality Date   AMPUTATION Right 05/03/2021   Procedure: AMPUTATION RAY;  Surgeon: Rosetta Posner, DPM;  Location: [facility];  Service: Podiatry;  Laterality: Right;  RIght Big Toe and Right Fifth Toe   IRRIGATION AND DEBRIDEMENT FOOT Bilateral 05/03/2021   Procedure: IRRIGATION AND DEBRIDEMENT FEET;  Surgeon: Rosetta Posner, DPM;  Location: [facility];  Service: Podiatry;  Laterality: Bilateral;   LOWER EXTREMITY ANGIOGRAPHY Right 04/27/2021   Procedure: Lower Extremity Angiography;  Surgeon: Renford Dills, MD;  Location: [facility];  Service:  Cardiovascular;  Laterality: Right;   LOWER EXTREMITY ANGIOGRAPHY Left 04/30/2021   Procedure: Lower Extremity Angiography;  Surgeon: Annice Needy, MD;  Location: [facility];  Service: Cardiovascular;  Laterality: Left;   TOTAL HIP ARTHROPLASTY Right 05/28/2015   Procedure: TOTAL HIP ARTHROPLASTY ANTERIOR APPROACH;  Surgeon: Kennedy Bucker, MD;  Location: [facility];  Service: Orthopedics;  Laterality: Right;    Social History   Socioeconomic History   Marital status: Single    Spouse name: Not on file   Number of children: 0   Years of education: Not on file   Highest education level: Not on file  Occupational History   Not on file  Tobacco Use   Smoking status: Never   Smokeless tobacco: Never  Vaping Use   Vaping status: Never Used  Substance and Sexual Activity   Alcohol use: No   Drug use: No   Sexual activity: Never  Other Topics Concern   Not on file  Social History Narrative   Pt lives alone   Social Drivers of Health   Financial Resource Strain: Low Risk  (03/10/2021)   Overall Financial Resource Strain (CARDIA)    Difficulty of Paying Living Expenses: Not very hard  Food Insecurity: No Food Insecurity (09/05/2023)   Hunger Vital Sign    Worried About Running Out of Food in the Last Year: Never true    Ran Out of  Food in the Last Year: Never true  Transportation Needs: No Transportation Needs (09/05/2023)   PRAPARE - Administrator, Civil Service (Medical): No    Lack of Transportation (Non-Medical): No  Physical Activity: Inactive (03/10/2021)   Exercise Vital Sign    Days of Exercise per Week: 0 days    Minutes of Exercise per Session: 0 min  Stress: No Stress Concern Present (03/10/2021)   [employer] of Occupational Health - Occupational Stress Questionnaire    Feeling of Stress : Only a little  Social Connections: Socially Isolated (09/05/2023)   Social Connection and Isolation Panel [NHANES]    Frequency of Communication with Friends  and Family: More than three times a week    Frequency of Social Gatherings with Friends and Family: Three times a week    Attends Religious Services: Never    Active Member of Clubs or Organizations: No    Attends Banker Meetings: Never    Marital Status: Never married  Intimate Partner Violence: Not At Risk (09/05/2023)   Humiliation, Afraid, Rape, and Kick questionnaire    Fear of Current or Ex-Partner: No    Emotionally Abused: No    Physically Abused: No    Sexually Abused: No    Family History  Problem Relation Age of Onset   Cancer Mother    Diabetes Father    No Known Allergies I? Current Facility-Administered Medications  Medication Dose Route Frequency Provider Last Rate Last Admin   acetaminophen (TYLENOL) tablet 650 mg  650 mg Oral Q6H Marolyn Haller, MD   650 mg at 09/11/23 1114   amLODipine (NORVASC) tablet 5 mg  5 mg Oral Daily Lorretta Harp, MD   5 mg at 09/11/23 1117   ascorbic acid (VITAMIN C) tablet 500 mg  500 mg Oral Daily Lorretta Harp, MD   500 mg at 09/11/23 1117   camphor-menthol (SARNA) lotion   Topical PRN Marolyn Haller, MD       cefTRIAXone (ROCEPHIN) 2 g in sodium chloride 0.9 % 100 mL IVPB  2 g Intravenous Q24H Alford Highland, MD 200 mL/hr at 09/10/23 1646 2 g at 09/10/23 1646   cloNIDine (CATAPRES) tablet 0.2 mg  0.2 mg Oral TID Alford Highland, MD   0.2 mg at 09/11/23 1116   clopidogrel (PLAVIX) tablet 75 mg  75 mg Oral Q breakfast Lorretta Harp, MD   75 mg at 09/11/23 1117   enoxaparin (LOVENOX) injection 65 mg  65 mg Subcutaneous Q24H Merryl Hacker, RPH   65 mg at 09/10/23 2213   ferrous gluconate (FERGON) tablet 324 mg  324 mg Oral Q breakfast Lorretta Harp, MD   324 mg at 09/11/23 1114   furosemide (LASIX) tablet 20 mg  20 mg Oral Daily Marolyn Haller, MD   20 mg at 09/11/23 1111   gabapentin (NEURONTIN) capsule 300 mg  300 mg Oral QHS Marolyn Haller, MD   300 mg at 09/10/23 2213   Gerhardt's butt cream   Topical Daily Marolyn Haller, MD   Given at 09/11/23 1107   HYDROmorphone (DILAUDID) injection 0.5 mg  0.5 mg Intravenous Q4H PRN Marolyn Haller, MD   0.5 mg at 09/08/23 2255   HYDROmorphone (DILAUDID) tablet 1 mg  1 mg Oral Q4H PRN Marolyn Haller, MD   1 mg at 09/10/23 1405   HYDROmorphone (DILAUDID) tablet 2 mg  2 mg Oral Q12H PRN Marolyn Haller, MD   2 mg at 09/11/23 1411   hydrOXYzine (ATARAX) tablet  10 mg  10 mg Oral Q6H PRN Marolyn Haller, MD   10 mg at 09/09/23 1706   insulin aspart (novoLOG) injection 0-5 Units  0-5 Units Subcutaneous QHS Lorretta Harp, MD   3 Units at 09/10/23 2214   insulin aspart (novoLOG) injection 0-9 Units  0-9 Units Subcutaneous TID WC Lorretta Harp, MD   3 Units at 09/11/23 1404   insulin glargine-yfgn (SEMGLEE) injection 10 Units  10 Units Subcutaneous BID Marolyn Haller, MD   10 Units at 09/11/23 1112   levothyroxine (SYNTHROID) tablet 200 mcg  200 mcg Oral Q0600 Lorretta Harp, MD   200 mcg at 09/11/23 0553   linezolid (ZYVOX) IVPB 600 mg  600 mg Intravenous Q12H Alford Highland, MD 300 mL/hr at 09/11/23 1122 600 mg at 09/11/23 1122   metoprolol succinate (TOPROL-XL) 24 hr tablet 12.5 mg  12.5 mg Oral Q breakfast Lorretta Harp, MD   12.5 mg at 09/11/23 1116   multivitamin with minerals tablet 1 tablet  1 tablet Oral Daily Lorretta Harp, MD   1 tablet at 09/11/23 1115   ondansetron (ZOFRAN) injection 4 mg  4 mg Intravenous Q8H PRN Lorretta Harp, MD       polyethylene glycol (MIRALAX / GLYCOLAX) packet 17 g  17 g Oral Daily Marolyn Haller, MD   17 g at 09/07/23 2130   senna-docusate (Senokot-S) tablet 2 tablet  2 tablet Oral Daily Marolyn Haller, MD   2 tablet at 09/10/23 8657     Abtx:  Anti-infectives (From admission, onward)    Start     Dose/Rate Route Frequency Ordered Stop   09/09/23 1545  linezolid (ZYVOX) IVPB 600 mg        600 mg 300 mL/hr over 60 Minutes Intravenous Every 12 hours 09/09/23 1451     09/09/23 1545  cefTRIAXone (ROCEPHIN) 2 g in sodium chloride 0.9 %  100 mL IVPB        2 g 200 mL/hr over 30 Minutes Intravenous Every 24 hours 09/09/23 1451     09/04/23 2030  fluconazole (DIFLUCAN) tablet 100 mg  Status:  Discontinued        100 mg Oral Daily 09/04/23 1946 09/05/23 1105   09/04/23 2000  vancomycin (VANCOCIN) IVPB 1000 mg/200 mL premix       Placed in "And" Linked Group   1,000 mg 200 mL/hr over 60 Minutes Intravenous  Once 09/04/23 1951 09/04/23 2225   09/04/23 2000  vancomycin (VANCOREADY) IVPB 1500 mg/300 mL       Placed in "And" Linked Group   1,500 mg 150 mL/hr over 120 Minutes Intravenous  Once 09/04/23 1951 09/04/23 2245   09/04/23 1930  vancomycin (VANCOREADY) IVPB 2000 mg/400 mL  Status:  Discontinued        2,000 mg 200 mL/hr over 120 Minutes Intravenous  Once 09/04/23 1919 09/04/23 1951   09/04/23 1930  ceFEPIme (MAXIPIME) 2 g in sodium chloride 0.9 % 100 mL IVPB        2 g 200 mL/hr over 30 Minutes Intravenous  Once 09/04/23 1919 09/04/23 2057       REVIEW OF SYSTEMS:  Const: negative fever, negative chills, negative weight loss Eyes: negative diplopia or visual changes, negative eye pain ENT: negative coryza, negative sore throat Resp: negative cough, hemoptysis, dyspnea Cards: negative for chest pain, palpitations, lower extremity edema GU: negative for frequency, dysuria and hematuria GI: Negative for abdominal pain, diarrhea, bleeding, constipation Skin: negative for rash and pruritus Heme: negative for easy bruising and  gum/nose bleeding MS: general weakness Neurolo:negative for headaches, dizziness, vertigo, memory problems  Psych:anxiety, depression  Endocrine: thyroid, diabetes Allergy/Immunology- negative for any medication or food allergies ?  Objective:  VITALS:  BP (!) 136/54 (BP Location: Right Arm)   Pulse 72   Temp 98.3 F (36.8 C)   Resp 16   Ht 5\' 5"  (1.651 m)   Wt 132.9 kg   SpO2 99%   BMI 48.76 kg/m   PHYSICAL EXAM:  General: Alert, , no distress, obese, pale Head: Normocephalic,  without obvious abnormality, atraumatic. Eyes: Conjunctivae clear, anicteric sclerae. Pupils are equal ENT Nares normal. No drainage or sinus tenderness. Lips, mucosa, and tongue normal. No Thrush Neck: Supple, symmetrical, no adenopathy, thyroid: non tender no carotid bruit and no JVD. Back:did not examine Lungs:b/l air entry Heart: s1s2 Abdomen: Soft, non-tender,not distended. Bowel sounds normal. No masses Extremities: dressing removed and wound examined b/l venous stasis with superficial venous ulcwers On the left leg laterally there is a furrowed longitudinal slit secondary to pressure Superficial ulceration         Skin: No rashes or lesions. Or bruising Lymph: Cervical, supraclavicular normal. Neurologic: Grossly non-focal Pertinent Labs Lab Results CBC    Component Value Date/Time   WBC 9.8 09/09/2023 0217   RBC 3.82 (L) 09/09/2023 0217   HGB 10.1 (L) 09/09/2023 0217   HGB 12.5 02/20/2012 0737   HCT 32.9 (L) 09/09/2023 0217   HCT 37.6 02/20/2012 0737   PLT 626 (H) 09/09/2023 0217   PLT 230 02/20/2012 0737   MCV 86.1 09/09/2023 0217   MCV 86 02/20/2012 0737   MCH 26.4 09/09/2023 0217   MCHC 30.7 09/09/2023 0217   RDW 15.6 (H) 09/09/2023 0217   RDW 15.1 (H) 02/20/2012 0737   LYMPHSABS 1.2 05/04/2021 0729   MONOABS 0.8 05/04/2021 0729   EOSABS 0.4 05/04/2021 0729   BASOSABS 0.1 05/04/2021 0729       Latest Ref Rng & Units 09/09/2023    2:17 AM 09/08/2023    5:32 AM 09/07/2023    7:23 AM  CMP  Glucose 70 - 99 mg/dL 409  811  914   BUN 8 - 23 mg/dL 37  30  30   Creatinine 0.44 - 1.00 mg/dL 7.82  9.56  2.13   Sodium 135 - 145 mmol/L 134  136  135   Potassium 3.5 - 5.1 mmol/L 4.3  4.2  4.4   Chloride 98 - 111 mmol/L 101  102  102   CO2 22 - 32 mmol/L 23  24  25    Calcium 8.9 - 10.3 mg/dL 8.7  8.7  8.5    Sed rate > 140 CRP 19.6  Microbiology: Recent Results (from the past 240 hours)  Culture, blood (Routine X 2) w Reflex to ID Panel     Status: None    Collection Time: 09/04/23  7:35 PM   Specimen: BLOOD  Result Value Ref Range Status   Specimen Description BLOOD LEFT ANTECUBITAL  Final   Special Requests   Final    BOTTLES DRAWN AEROBIC AND ANAEROBIC Blood Culture results may not be optimal due to an inadequate volume of blood received in culture bottles   Culture   Final    NO GROWTH 5 DAYS Performed at [facility], [address] [place], [place] [address]    Report Status 09/09/2023 FINAL  Final  Culture, blood (Routine X 2) w Reflex to ID Panel     Status: None  Collection Time: 09/04/23  7:43 PM   Specimen: BLOOD  Result Value Ref Range Status   Specimen Description BLOOD BLOOD LEFT FOREARM  Final   Special Requests   Final    BOTTLES DRAWN AEROBIC AND ANAEROBIC Blood Culture results may not be optimal due to an inadequate volume of blood received in culture bottles   Culture   Final    NO GROWTH 5 DAYS Performed at [facility], [address] [place], [place] [address]    Report Status 09/09/2023 FINAL  Final  Aerobic/Anaerobic Culture w Gram Stain (surgical/deep wound)     Status: None (Preliminary result)   Collection Time: 09/09/23  4:58 PM   Specimen: Wound  Result Value Ref Range Status   Specimen Description   Final    WOUND Performed at [facility], [address], [place], [place] [address]    Special Requests   Final    LL Performed at [facility], [address] [place], [place] [address]    Gram Stain   Final    FEW WBC PRESENT, PREDOMINANTLY MONONUCLEAR FEW GRAM POSITIVE COCCI IN PAIRS FEW GRAM VARIABLE ROD FEW YEAST Performed at [facility], [address] [place], [place] [address]    Culture   Final    FEW PROVIDENCIA STUARTII CULTURE REINCUBATED FOR BETTER GROWTH NO ANAEROBES ISOLATED; CULTURE IN PROGRESS FOR 5 DAYS    Report Status PENDING  Incomplete    IMAGING RESULTS:  I have personally reviewed the films ? High-grade lateral  left calf soft tissue ulceration that extends to the lateral fibula at the slightly proximal aspect of the fibular diaphysis through the distal fibular diaphysis along an approximate 13 cm craniocaudal length. 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   Impression/Recommendation ?Chronic venous stasis.ulceration of both legs Left pressure ulceration on the lateral aspect leading to a linear ulcer and underlying fibula osteomyelitis-with underlying PAD  very unusual presentation Recommend vascular consultation for biopsy of fibula for micro culture  and pathology Doubt  this will heal just with antibiotics? Need to offload the leg  ? Hyperbaric oxygen Pt currently on linezolid and ceftriaxone Need broad spectrum like dapto and cefepime Superficial culture is providencia which could be colonization  Anemia CKD Very high ESR> 140- could be from osteo , but with CKD, anemia need to r/o Multiple myeloma ? ?Severe PAD- s/p stenting of lower extremity On plavix  DM on insulin  HTN on amlodipine Metoprolol  Hypothyroidism on synthroid   I have personally spent  -75-minutes involved in face-to-face and non-face-to-face activities for this patient on the day of the visit. Professional time spent includes the following activities: Preparing to see the patient (review of tests), Obtaining and/or reviewing separately obtained history (admission/discharge record), Performing a medically appropriate examination and/or evaluation , Ordering medications/tests/procedures, referring and communicating with other health care professionals, Documenting clinical information in the EMR, Independently interpreting results (not separately reported), Communicating results to the patient/family/caregiver, Counseling and educating the patient/family/caregiver and Care coordination (not separately reported).  This involved complex antimicrobial counseling and treatment   ________________________________________________ Discussed with patient, requesting provider Note:  This document was prepared using Dragon voice recognition software and may include unintentional dictation errors.

## 2023-09-11 NOTE — Inpatient Diabetes Management (Signed)
 Inpatient Diabetes Program Recommendations  AACE/ADA: New Consensus Statement on Inpatient Glycemic Control (2015)  Target Ranges:  Prepandial:   less than 140 mg/dL      Peak postprandial:   less than 180 mg/dL (1-2 hours)      Critically ill patients:  140 - 180 mg/dL   Lab Results  Component Value Date   GLUCAP 172 (H) 09/11/2023   HGBA1C 8.1 (H) 09/06/2023    Review of Glycemic Control  Latest Reference Range & Units 09/10/23 08:29 09/10/23 12:10 09/10/23 16:48 09/10/23 21:32 09/11/23 08:24  Glucose-Capillary 70 - 99 mg/dL 829 (H) 562 (H) 130 (H) 281 (H) 172 (H)  (H): Data is abnormally high Diabetes history: Type 2 DM Outpatient Diabetes medications: Lantus 11 units BID, Novolog 3 units TID Current orders for Inpatient glycemic control: Semglee 10 units BID, Novolog 0-9 units TID & HS  Inpatient Diabetes Program Recommendations:    Consider increasing Semglee 12 units BID.   When diet advances, would also consider Novolog 3 units TID (assuming patient consuming >50% of meals).  Thanks, Lujean Rave, MSN, RNC-OB Diabetes Coordinator [phone] (8a-5p)

## 2023-09-11 NOTE — Plan of Care (Signed)
  Problem: Health Behavior/Discharge Planning: Goal: Ability to manage health-related needs will improve Outcome: Progressing   Problem: Fluid Volume: Goal: Ability to maintain a balanced intake and output will improve Outcome: Not Progressing

## 2023-09-12 ENCOUNTER — Other Ambulatory Visit: Payer: Self-pay

## 2023-09-12 DIAGNOSIS — N179 Acute kidney failure, unspecified: Secondary | ICD-10-CM | POA: Diagnosis not present

## 2023-09-12 DIAGNOSIS — M86262 Subacute osteomyelitis, left tibia and fibula: Secondary | ICD-10-CM | POA: Diagnosis not present

## 2023-09-12 DIAGNOSIS — S81809A Unspecified open wound, unspecified lower leg, initial encounter: Principal | ICD-10-CM

## 2023-09-12 DIAGNOSIS — M86162 Other acute osteomyelitis, left tibia and fibula: Secondary | ICD-10-CM | POA: Diagnosis not present

## 2023-09-12 DIAGNOSIS — I10 Essential (primary) hypertension: Secondary | ICD-10-CM | POA: Diagnosis not present

## 2023-09-12 DIAGNOSIS — I878 Other specified disorders of veins: Secondary | ICD-10-CM | POA: Diagnosis not present

## 2023-09-12 DIAGNOSIS — I5022 Chronic systolic (congestive) heart failure: Secondary | ICD-10-CM | POA: Diagnosis not present

## 2023-09-12 DIAGNOSIS — I739 Peripheral vascular disease, unspecified: Secondary | ICD-10-CM | POA: Diagnosis not present

## 2023-09-12 DIAGNOSIS — I872 Venous insufficiency (chronic) (peripheral): Secondary | ICD-10-CM | POA: Diagnosis not present

## 2023-09-12 LAB — BASIC METABOLIC PANEL
Anion gap: 7 (ref 5–15)
BUN: 30 mg/dL — ABNORMAL HIGH (ref 8–23)
CO2: 24 mmol/L (ref 22–32)
Calcium: 8.8 mg/dL — ABNORMAL LOW (ref 8.9–10.3)
Chloride: 103 mmol/L (ref 98–111)
Creatinine, Ser: 1.41 mg/dL — ABNORMAL HIGH (ref 0.44–1.00)
GFR, Estimated: 40 mL/min — ABNORMAL LOW (ref >60)
Glucose, Bld: 273 mg/dL — ABNORMAL HIGH (ref 70–99)
Potassium: 4.3 mmol/L (ref 3.5–5.1)
Sodium: 134 mmol/L — ABNORMAL LOW (ref 135–145)

## 2023-09-12 LAB — CBC
HCT: 31.5 % — ABNORMAL LOW (ref 36.0–46.0)
Hemoglobin: 9.7 g/dL — ABNORMAL LOW (ref 12.0–15.0)
MCH: 26.6 pg (ref 26.0–34.0)
MCHC: 30.8 g/dL (ref 30.0–36.0)
MCV: 86.3 fL (ref 80.0–100.0)
Platelets: 494 10*3/uL — ABNORMAL HIGH (ref 150–400)
RBC: 3.65 MIL/uL — ABNORMAL LOW (ref 3.87–5.11)
RDW: 16.1 % — ABNORMAL HIGH (ref 11.5–15.5)
WBC: 8.2 10*3/uL (ref 4.0–10.5)
nRBC: 0 % (ref 0.0–0.2)

## 2023-09-12 LAB — GLUCOSE, CAPILLARY
Glucose-Capillary: 228 mg/dL — ABNORMAL HIGH (ref 70–99)
Glucose-Capillary: 182 mg/dL — ABNORMAL HIGH (ref 70–99)
Glucose-Capillary: 229 mg/dL — ABNORMAL HIGH (ref 70–99)
Glucose-Capillary: 211 mg/dL — ABNORMAL HIGH (ref 70–99)

## 2023-09-12 LAB — SEDIMENTATION RATE: Sed Rate: 108 mm/h — ABNORMAL HIGH (ref 0–30)

## 2023-09-12 MED ORDER — INSULIN ASPART 100 UNIT/ML IJ SOLN
3.0000 [IU] | Freq: Three times a day (TID) | INTRAMUSCULAR | Status: DC
  Administered 2023-09-12 – 2023-09-15 (×6): 3 [IU] via SUBCUTANEOUS
  Filled 2023-09-12 (×8): qty 1

## 2023-09-12 MED ORDER — DAPTOMYCIN-SODIUM CHLORIDE 700-0.9 MG/100ML-% IV SOLN
8.0000 mg/kg | Freq: Every day | INTRAVENOUS | Status: DC
  Administered 2023-09-12 – 2023-09-14 (×3): 700 mg via INTRAVENOUS
  Filled 2023-09-12 (×4): qty 100

## 2023-09-12 MED ORDER — CHLORHEXIDINE GLUCONATE CLOTH 2 % EX PADS
6.0000 | MEDICATED_PAD | Freq: Every day | CUTANEOUS | Status: DC
  Administered 2023-09-12 – 2023-09-15 (×4): 6 via TOPICAL

## 2023-09-12 MED ORDER — SODIUM CHLORIDE 0.9% FLUSH
10.0000 mL | Freq: Two times a day (BID) | INTRAVENOUS | Status: DC
  Administered 2023-09-12 – 2023-09-13 (×3): 10 mL
  Administered 2023-09-14: 20 mL
  Administered 2023-09-14: 10 mL
  Administered 2023-09-15: 20 mL

## 2023-09-12 MED ORDER — SODIUM CHLORIDE 0.9% FLUSH: 10.0000 mL | INTRAVENOUS | Status: DC | PRN

## 2023-09-12 NOTE — Plan of Care (Signed)
  Problem: Education: Goal: Ability to describe self-care measures that may prevent or decrease complications (Diabetes Survival Skills Education) will improve Outcome: Progressing   Problem: Fluid Volume: Goal: Ability to maintain a balanced intake and output will improve Outcome: Progressing   Problem: Health Behavior/Discharge Planning: Goal: Ability to manage health-related needs will improve Outcome: Progressing   Problem: Metabolic: Goal: Ability to maintain appropriate glucose levels will improve Outcome: Progressing   Problem: Nutritional: Goal: Maintenance of adequate nutrition will improve Outcome: Progressing Goal: Progress toward achieving an optimal weight will improve Outcome: Progressing   Problem: Skin Integrity: Goal: Risk for impaired skin integrity will decrease Outcome: Progressing   Problem: Clinical Measurements: Goal: Diagnostic test results will improve Outcome: Progressing   Problem: Activity: Goal: Risk for activity intolerance will decrease Outcome: Progressing   Problem: Elimination: Goal: Will not experience complications related to bowel motility Outcome: Progressing Goal: Will not experience complications related to urinary retention Outcome: Progressing

## 2023-09-12 NOTE — Progress Notes (Signed)
 Date of Admission:  09/04/2023      ID: [name] is a 68 y.o. female Principal Problem:   Osteomyelitis of left fibula (HCC) Active Problems:   Essential hypertension   Hypothyroidism   Acute kidney injury superimposed on CKD (HCC)   PVD (peripheral vascular disease) (HCC)   Type II diabetes mellitus with renal manifestations (HCC)   Chronic systolic CHF (congestive heart failure) (HCC)   Obesity, Class III, BMI 40-49.9 (morbid obesity) (HCC)   Thrombocytosis   Anemia of chronic disease    Subjective: Patient doing okay  Medications:   acetaminophen  650 mg Oral Q6H   amLODipine  5 mg Oral Daily   ascorbic acid  500 mg Oral Daily   Chlorhexidine Gluconate Cloth  6 each Topical Daily   cloNIDine  0.2 mg Oral TID   clopidogrel  75 mg Oral Q breakfast   enoxaparin (LOVENOX) injection  65 mg Subcutaneous Q24H   ferrous gluconate  324 mg Oral Q breakfast   furosemide  20 mg Oral Daily   gabapentin  300 mg Oral QHS   Gerhardt's butt cream   Topical Daily   insulin aspart  0-5 Units Subcutaneous QHS   insulin aspart  0-9 Units Subcutaneous TID WC   insulin aspart  3 Units Subcutaneous TID WC   insulin glargine-yfgn  12 Units Subcutaneous BID   levothyroxine  200 mcg Oral Q0600   metoprolol succinate  12.5 mg Oral Q breakfast   multivitamin with minerals  1 tablet Oral Daily   sodium chloride flush  10-40 mL Intracatheter Q12H    Objective: Vital signs in last 24 hours: Patient Vitals for the past 24 hrs:  BP Temp Pulse Resp SpO2  09/12/23 0736 (!) 144/53 97.7 F (36.5 C) 78 16 100 %  09/12/23 0503 (!) 147/51 97.7 F (36.5 C) 73 18 99 %  09/11/23 2019 (!) 139/51 (!) 97.5 F (36.4 C) 73 16 97 %  09/11/23 1551 (!) 143/51 98.4 F (36.9 C) 86 16 96 %     LDA Right PICC line  PHYSICAL EXAM:  General: Alert, cooperative, no distress, appears stated age.  Lungs: Clear to auscultation bilaterally. No Wheezing or Rhonchi. No rales. Heart: Regular rate and  rhythm, no murmur, rub or gallop. Abdomen: Soft, non-tender,not distended. Bowel sounds normal. No masses Extremities: Bilateral venous  ulceration         Skin: No rashes or lesions. Or bruising Lymph: Cervical, supraclavicular normal. Neurologic: Grossly non-focal  Lab Results    Latest Ref Rng & Units 09/12/2023    4:03 AM 09/09/2023    2:17 AM 09/08/2023    5:32 AM  CBC  WBC 4.0 - 10.5 K/uL 8.2  9.8  8.9   Hemoglobin 12.0 - 15.0 g/dL 9.7  09.8  11.9   Hematocrit 36.0 - 46.0 % 31.5  32.9  32.0   Platelets 150 - 400 K/uL 494  626  490        Latest Ref Rng & Units 09/12/2023    4:03 AM 09/09/2023    2:17 AM 09/08/2023    5:32 AM  CMP  Glucose 70 - 99 mg/dL 147  829  562   BUN 8 - 23 mg/dL 30  37  30   Creatinine 0.44 - 1.00 mg/dL 1.30  8.65  7.84   Sodium 135 - 145 mmol/L 134  134  136   Potassium 3.5 - 5.1 mmol/L 4.3  4.3  4.2  Chloride 98 - 111 mmol/L 103  101  102   CO2 22 - 32 mmol/L 24  23  24    Calcium 8.9 - 10.3 mg/dL 8.8  8.7  8.7       Microbiology: Wound culture provide NG and Staph aureus Studies/Results: [name] EKG SITE RITE Result Date: 09/12/2023 If Site Rite image not attached, placement could not be confirmed due to current cardiac rhythm.    Assessment/Plan: Chronic venous stasis.ulceration of both legs Left pressure ulceration on the lateral aspect leading to a linear ulcer and underlying fibula osteomyelitis-with underlying PAD  very unusual presentation Requested for fibular biopsy but that is not possible Doubt  this will heal just with antibiotics? Need to offload the leg  ? Hyperbaric oxygen Pt currently on linezolid and ceftriaxone Changed to dapto and cefepime    Anemia CKD Very high ESR> 140- could be from osteo , but with CKD, anemia need to r/o Multiple myeloma ? ?Severe PAD- s/p stenting of lower extremity On plavix   DM on insulin   HTN on amlodipine Metoprolol   Hypothyroidism on synthroid   Discussed the management  with the patient and the hospitalist. OPAT orders will be written

## 2023-09-12 NOTE — Progress Notes (Signed)
 Progress Note   Patient: [name] GNF:[id number] DOB: 11/28/1955 DOA: 09/04/2023     8 DOS: the patient was seen and examined on 09/12/2023   Brief hospital course: 68 year old female past medical history of chronic wounds bilateral legs, systolic congestive heart failure with EF 45%, chronic lymphedema, hypertension, type 2 diabetes mellitus, peripheral vascular disease, hypothyroidism, CKD stage IIIa presents with leg wounds and leg pain.  Patient has chronic wounds both legs since 2022 and following up at wound care.  Patient admitted to the hospital on 09/04/2023.  Looks like initially started on antibiotics but then discontinued.  ABI negative.  Sedimentation rate greater than 140  3/15.  MRI left leg reviewed showing osteomyelitis.  Case discussed with infectious disease specialist and will start Rocephin and Zyvox. 3/16.  Wound culture as a few gram-positive cocci in pairs and a few gram variable rod. 3/17. Wound culture still showing gram-positive cocci in pairs and gram variable rod 3/18.  Wound culture growing corynebacterium, Staph aureus and Providencia stuartii  Assessment and Plan: * Osteomyelitis of left fibula (HCC) MRI resulted on 3/15 consistent with osteomyelitis left leg.  Wound culture on 3/15 growing corynebacterium, Staph aureus and Providencia stuartii.  ID changed antibiotics to daptomycin and Maxipime.   ESR on admission greater than 140 and now down to 108.  Blood cultures negative for 5 days.  Infectious disease specialist to determine treatment course.  Unable to get a fibula biopsy.  Acute kidney injury superimposed on CKD (HCC) AKI on CKD stage IIIa.  Creatinine on presentation 1.47.  Creatinine peaked at 1.89 on 3/12.  Last creatinine 1.41.  Chronic systolic CHF (congestive heart failure) (HCC) Patient on low-dose Toprol and Lasix.  EF 45% on last echo in chart back in 2018.  Type II diabetes mellitus with renal manifestations (HCC) CKD stage IIIa.   Patient on Semglee insulin 12 units twice a day with sliding scale  Essential hypertension Patient on Lasix, metoprolol, clonidine (decreased dose) and Norvasc.  Hypothyroidism On Synthroid  PVD (peripheral vascular disease) (HCC) On Plavix  Obesity, Class III, BMI 40-49.9 (morbid obesity) (HCC) BMI 48.76  Anemia of chronic disease Ferritin 107 and last hemoglobin 9.7.  Thrombocytosis Likely secondary to osteomyelitis.       Subjective: Patient feels okay.  Has some pain in her legs.  Admitted with lower extremity wounds and found to have osteomyelitis.  Physical Exam: Vitals:   09/11/23 2019 09/12/23 0503 09/12/23 0736 09/12/23 1711  BP: (!) 139/51 (!) 147/51 (!) 144/53 (!) 120/51  Pulse: 73 73 78 99  Resp: 16 18 16 16   Temp: (!) 97.5 F (36.4 C) 97.7 F (36.5 C) 97.7 F (36.5 C) 98 F (36.7 C)  TempSrc:    Oral  SpO2: 97% 99% 100% 100%  Weight:      Height:       Physical Exam HENT:     Head: Normocephalic.  Eyes:     General: Lids are normal.     Conjunctiva/sclera: Conjunctivae normal.  Cardiovascular:     Rate and Rhythm: Normal rate and regular rhythm.     Heart sounds: Normal heart sounds, S1 normal and S2 normal.  Pulmonary:     Breath sounds: No decreased breath sounds, wheezing, rhonchi or rales.  Abdominal:     Palpations: Abdomen is soft.     Tenderness: There is no abdominal tenderness.  Musculoskeletal:     Right upper leg: Swelling present.     Left upper leg: Swelling present.  Skin:    Comments: Legs covered  Neurological:     Mental Status: She is alert and oriented to person, place, and time.     Data Reviewed: Wound culture growing corynebacterium, Staph aureus and Providencia stuartii Sodium down to 134, creatinine 1.41, white blood count 8.2, hemoglobin 9.7, platelet count 494, ESR down to 108.  Family Communication: spoke with [name]  Disposition: Status is: Inpatient Remains inpatient appropriate because: awaiting final  sensitivities  Planned Discharge Destination: long term care    Time spent: 28 minutes  Author: Alford Highland, MD 09/12/2023 5:18 PM  For on call review [email or web address].

## 2023-09-12 NOTE — Progress Notes (Signed)
 Peripherally Inserted Central Catheter Placement  The IV Nurse has discussed with the patient and/or persons authorized to consent for the patient, the purpose of this procedure and the potential benefits and risks involved with this procedure.  The benefits include less needle sticks, lab draws from the catheter, and the patient may be discharged home with the catheter. Risks include, but not limited to, infection, bleeding, blood clot (thrombus formation), and puncture of an artery; nerve damage and irregular heartbeat and possibility to perform a PICC exchange if needed/ordered by physician.  Alternatives to this procedure were also discussed.  Bard Power PICC patient education guide, fact sheet on infection prevention and patient information card has been provided to patient /or left at bedside.    PICC Placement Documentation  PICC Single Lumen 09/12/23 Right Cephalic 39 cm 0 cm (Active)  Indication for Insertion or Continuance of Line Prolonged intravenous therapies 09/12/23 1218  Exposed Catheter (cm) 0 cm 09/12/23 1218  Site Assessment Clean, Dry, Intact 09/12/23 1218  Line Status Flushed;Saline locked;Blood return noted 09/12/23 1218  Dressing Type Transparent;Securing device 09/12/23 1218  Dressing Status Antimicrobial disc/dressing in place 09/12/23 1218  Line Care Connections checked and tightened 09/12/23 1218  Line Adjustment (NICU/IV Team Only) No 09/12/23 1218  Dressing Intervention New dressing;Adhesive placed at insertion site (IV team only) 09/12/23 1218  Dressing Change Due 09/19/23 09/12/23 1218       Vernona Rieger  Jeremiah Curci 09/12/2023, 12:19 PM

## 2023-09-12 NOTE — Progress Notes (Signed)
 Pharmacy Antibiotic Note  [name] is a 68 y.o. female admitted on 09/04/2023 with  wound and osteomyelitis .  Patient with chronic leg wounds and MRI showing osteomyelitis of fibula. Pharmacy has been consulted for daptomycin dosing.  Today, 09/12/2023 Day #4 linezolid, Day #1 cefepime (previously on ceftriaxone) Renal: SCr 1.41 PMH of CKD - looks to be currently at or slightly above baseline WBC WNL Afebrile 3/10 blood culture: NG-final 3/15 wound culture:  Corynebacterium, S. Aureus and P stuartii.   Await susceptibilities   Plan:  Daptomycin 700mg  (8mg /kg) IV q24h (using adjusted BW of 87.4kg) CK 3/19 then check weekly Not on statin therapy  Continue cefepime 2gm IV q12h based on CrCl < 17ml/min Await final cultures to determine antibiotics at discharge Follow renal function  ID following   Height: 5\' 5"  (165.1 cm) Weight: 132.9 kg (293 lb) IBW/kg (Calculated) : 57  Temp (24hrs), Avg:97.6 F (36.4 C), Min:97.5 F (36.4 C), Max:97.7 F (36.5 C)  Recent Labs  Lab 09/06/23 0536 09/07/23 0723 09/08/23 0532 09/09/23 0217 09/12/23 0403  WBC 10.2 8.2 8.9 9.8 8.2  CREATININE 1.89* 1.69* 1.41* 1.66* 1.41*    Estimated Creatinine Clearance: 51.2 mL/min (A) (by C-G formula based on SCr of 1.41 mg/dL (H)).    No Known Allergies  Antimicrobials this admission: Daptomycin 3/18 >> Cefepime 3/17 >> Linezolid 3/15>>3/18 Ceftriaxone 3/15>>3/17 Vancomycin 3/10 x1 Cefepime 3/10 x1 Fluconazole 3/10>>3/11  Thank you for allowing pharmacy to be a part of this patient's care.  Juliette Alcide, PharmD, BCPS, BCIDP Work Cell: [phone] 09/12/2023 3:59 PM

## 2023-09-12 NOTE — Inpatient Diabetes Management (Signed)
 Inpatient Diabetes Program Recommendations  AACE/ADA: New Consensus Statement on Inpatient Glycemic Control   Target Ranges:  Prepandial:   less than 140 mg/dL      Peak postprandial:   less than 180 mg/dL (1-2 hours)      Critically ill patients:  140 - 180 mg/dL    Latest Reference Range & Units 09/11/23 08:24 09/11/23 12:54 09/11/23 17:15 09/11/23 21:20 09/12/23 07:38  Glucose-Capillary 70 - 99 mg/dL 132 (H) 440 (H) 102 (H) 311 (H) 228 (H)   Review of Glycemic Control  Diabetes history: DM2 Outpatient Diabetes medications: Lantus 11 units BID, Novolog 3 units TID with meals Current orders for Inpatient glycemic control: Semglee 12 units BID, Novolog 0-9 units TID with meals, Novolog 0-5 units QHS  Inpatient Diabetes Program Recommendations:    Insulin:Noted Semglee increased from 10 units BID to 12 units BID on 09/11/23.  Please consider ordering Novolog 3 units TID with meals for meal coverage if patient eats at least 50% of meals.  Thanks, Orlando Penner, RN, MSN, CDCES Diabetes Coordinator Inpatient Diabetes Program [phone] (Team Pager from 8am to 5pm)

## 2023-09-12 NOTE — [place] FL2 (Signed)
 [place] MEDICAID FL2 LEVEL OF CARE FORM     IDENTIFICATION  Patient Name: [name] Birthdate: 12-29-1955 Sex: female Admission Date (Current Location): 09/04/2023  [place] Number:  Chiropodist and Address:  [facility], [address] [place], [place] [address]      Provider Number: [id number]  Attending Physician Name and Address:  Alford Highland, MD  Relative Name and Phone Number:  Cyndia Bent  [facility]  Emergency Contact  [phone]  [address] [place] [address]    Current Level of Care: Hospital Recommended Level of Care: Skilled Nursing Facility Prior Approval Number:    Date Approved/Denied:   PASRR Number: [id number]  Discharge Plan: SNF    Current Diagnoses: Patient Active Problem List   Diagnosis Date Noted   Thrombocytosis 09/10/2023   Anemia of chronic disease 09/10/2023   Osteomyelitis of left fibula (HCC) 09/09/2023   Type II diabetes mellitus with renal manifestations (HCC) 09/04/2023   Chronic systolic CHF (congestive heart failure) (HCC) 09/04/2023   Obesity, Class III, BMI 40-49.9 (morbid obesity) (HCC) 09/04/2023   DKA (diabetic ketoacidosis) (HCC) 10/30/2021   PVD (peripheral vascular disease) (HCC)    Vertigo    Sepsis without acute organ dysfunction (HCC)    Acute kidney injury superimposed on CKD (HCC)    Adjustment disorder with anxiety 04/21/2021   Sepsis (HCC) 04/17/2021   Pressure injury of skin 04/17/2021   Left leg pain 04/16/2021   Morbid obesity (HCC) 10/24/2017   Chronic kidney disease, stage IV (severe) (HCC) 10/24/2017   Hypothyroidism 11/15/2016   Edema 11/15/2016   Type 2 diabetes mellitus with hyperlipidemia (HCC) 11/15/2016   Essential hypertension 12/09/2015   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) 06/26/2015   Pressure ulcer 05/31/2015   Primary osteoarthritis of right hip 05/28/2015    Orientation  RESPIRATION BLADDER Height & Weight     Self, Time, Situation, Place  Normal External catheter Weight: 132.9 kg Height:  5\' 5"  (165.1 cm)  BEHAVIORAL SYMPTOMS/MOOD NEUROLOGICAL BOWEL NUTRITION STATUS        Diet (Regular)  AMBULATORY STATUS COMMUNICATION OF NEEDS Skin   Extensive Assist Verbally Skin abrasions (leg wounds,full and partial thickness wounds r/t venous hypertension and peripheral vascular disease ,all wounds appear largely pink and moist; scattered areas of dark eschar)                       Personal Care Assistance Level of Assistance  Bathing, Feeding, Dressing Bathing Assistance: Maximum assistance Feeding assistance: Limited assistance Dressing Assistance: Maximum assistance     Functional Limitations Info  Sight, Hearing, Speech Sight Info: Adequate Hearing Info: Adequate Speech Info: Adequate    SPECIAL CARE FACTORS FREQUENCY   (IV ABX)                    Contractures Contractures Info: Not present    Additional Factors Info  Code Status, Allergies Code Status Info: full code Allergies Info: NKDA           Current Medications (09/12/2023):  This is the current hospital active medication list Current Facility-Administered Medications  Medication Dose Route Frequency Provider Last Rate Last Admin   acetaminophen (TYLENOL) tablet 650 mg  650 mg Oral Q6H Marolyn Haller, MD   650 mg at 09/12/23 0948   amLODipine (NORVASC) tablet 5 mg  5 mg Oral Daily Lorretta Harp, MD   5  mg at 09/12/23 5643   ascorbic acid (VITAMIN C) tablet 500 mg  500 mg Oral Daily Lorretta Harp, MD   500 mg at 09/12/23 3295   camphor-menthol (SARNA) lotion   Topical PRN Marolyn Haller, MD       ceFEPIme (MAXIPIME) 2 g in sodium chloride 0.9 % 100 mL IVPB  2 g Intravenous Q12H Lynn Ito, MD   Stopped at 09/12/23 1022   Chlorhexidine Gluconate Cloth 2 % PADS 6 each  6 each Topical Daily Lynn Ito, MD   6 each at 09/12/23 1356   cloNIDine (CATAPRES)  tablet 0.2 mg  0.2 mg Oral TID Alford Highland, MD   0.2 mg at 09/12/23 0948   clopidogrel (PLAVIX) tablet 75 mg  75 mg Oral Q breakfast Lorretta Harp, MD   75 mg at 09/12/23 0949   DAPTOmycin (CUBICIN) IVPB 700 mg/125mL premix  8 mg/kg (Adjusted) Intravenous Q1400 Zeigler, Burtis Junes, RPH       enoxaparin (LOVENOX) injection 65 mg  65 mg Subcutaneous Q24H Hunt, Madison H, RPH   65 mg at 09/11/23 2136   ferrous gluconate (FERGON) tablet 324 mg  324 mg Oral Q breakfast Lorretta Harp, MD   324 mg at 09/12/23 1884   furosemide (LASIX) tablet 20 mg  20 mg Oral Daily Marolyn Haller, MD   20 mg at 09/12/23 1660   gabapentin (NEURONTIN) capsule 300 mg  300 mg Oral QHS Marolyn Haller, MD   300 mg at 09/11/23 2131   Gerhardt's butt cream   Topical Daily Marolyn Haller, MD   Given at 09/12/23 [phone]   HYDROmorphone (DILAUDID) injection 0.5 mg  0.5 mg Intravenous Q4H PRN Marolyn Haller, MD   0.5 mg at 09/08/23 2255   HYDROmorphone (DILAUDID) tablet 1 mg  1 mg Oral Q4H PRN Marolyn Haller, MD   1 mg at 09/10/23 1405   HYDROmorphone (DILAUDID) tablet 2 mg  2 mg Oral Q12H PRN Marolyn Haller, MD   2 mg at 09/11/23 1411   hydrOXYzine (ATARAX) tablet 10 mg  10 mg Oral Q6H PRN Marolyn Haller, MD   10 mg at 09/09/23 1706   insulin aspart (novoLOG) injection 0-5 Units  0-5 Units Subcutaneous QHS Lorretta Harp, MD   4 Units at 09/11/23 2132   insulin aspart (novoLOG) injection 0-9 Units  0-9 Units Subcutaneous TID WC Lorretta Harp, MD   2 Units at 09/12/23 1355   insulin aspart (novoLOG) injection 3 Units  3 Units Subcutaneous TID WC Wieting, Richard, MD       insulin glargine-yfgn [facility]) injection 12 Units  12 Units Subcutaneous BID Alford Highland, MD   12 Units at 09/12/23 0947   levothyroxine (SYNTHROID) tablet 200 mcg  200 mcg Oral Q0600 Lorretta Harp, MD   200 mcg at 09/12/23 0502   metoprolol succinate (TOPROL-XL) 24 hr tablet 12.5 mg  12.5 mg Oral Q breakfast Lorretta Harp, MD   12.5 mg at 09/12/23 6010    multivitamin with minerals tablet 1 tablet  1 tablet Oral Daily Lorretta Harp, MD   1 tablet at 09/12/23 0948   ondansetron (ZOFRAN) injection 4 mg  4 mg Intravenous Q8H PRN Lorretta Harp, MD       sodium chloride flush (NS) 0.9 % injection 10-40 mL  10-40 mL Intracatheter Q12H Ravishankar, Rhodia Albright, MD   10 mL at 09/12/23 1355   sodium chloride flush (NS) 0.9 % injection 10-40 mL  10-40 mL Intracatheter PRN Lynn Ito, MD  Discharge Medications: Please see discharge summary for a list of discharge medications.  Relevant Imaging Results:  Relevant Lab Results:   Additional Information [id number]  Marlowe Sax, RN

## 2023-09-12 NOTE — Plan of Care (Signed)
   Problem: Fluid Volume: Goal: Ability to maintain a balanced intake and output will improve Outcome: Progressing

## 2023-09-13 DIAGNOSIS — E1122 Type 2 diabetes mellitus with diabetic chronic kidney disease: Secondary | ICD-10-CM | POA: Diagnosis not present

## 2023-09-13 DIAGNOSIS — M86162 Other acute osteomyelitis, left tibia and fibula: Secondary | ICD-10-CM

## 2023-09-13 DIAGNOSIS — D638 Anemia in other chronic diseases classified elsewhere: Secondary | ICD-10-CM | POA: Diagnosis not present

## 2023-09-13 DIAGNOSIS — I739 Peripheral vascular disease, unspecified: Secondary | ICD-10-CM | POA: Diagnosis not present

## 2023-09-13 DIAGNOSIS — I872 Venous insufficiency (chronic) (peripheral): Secondary | ICD-10-CM | POA: Diagnosis not present

## 2023-09-13 DIAGNOSIS — I878 Other specified disorders of veins: Secondary | ICD-10-CM | POA: Diagnosis not present

## 2023-09-13 LAB — PROTEIN ELECTROPHORESIS, SERUM
A/G Ratio: 0.5 — ABNORMAL LOW (ref 0.7–1.7)
Albumin ELP: 2.1 g/dL — ABNORMAL LOW (ref 2.9–4.4)
Alpha-1-Globulin: 0.3 g/dL (ref 0.0–0.4)
Alpha-2-Globulin: 1.2 g/dL — ABNORMAL HIGH (ref 0.4–1.0)
Beta Globulin: 1.4 g/dL — ABNORMAL HIGH (ref 0.7–1.3)
Gamma Globulin: 1 g/dL (ref 0.4–1.8)
Globulin, Total: 3.9 g/dL (ref 2.2–3.9)
M-Spike, %: 0.4 g/dL — ABNORMAL HIGH
Total Protein ELP: 6 g/dL (ref 6.0–8.5)

## 2023-09-13 LAB — CK: Total CK: 13 U/L — ABNORMAL LOW (ref 38–234)

## 2023-09-13 LAB — GLUCOSE, CAPILLARY
Glucose-Capillary: 157 mg/dL — ABNORMAL HIGH (ref 70–99)
Glucose-Capillary: 197 mg/dL — ABNORMAL HIGH (ref 70–99)
Glucose-Capillary: 233 mg/dL — ABNORMAL HIGH (ref 70–99)
Glucose-Capillary: 258 mg/dL — ABNORMAL HIGH (ref 70–99)

## 2023-09-13 MED ORDER — INSULIN GLARGINE-YFGN 100 UNIT/ML ~~LOC~~ SOLN
14.0000 [IU] | Freq: Two times a day (BID) | SUBCUTANEOUS | Status: DC
  Administered 2023-09-13 – 2023-09-15 (×3): 14 [IU] via SUBCUTANEOUS
  Filled 2023-09-13 (×5): qty 0.14

## 2023-09-13 MED ORDER — IRBESARTAN 150 MG PO TABS
150.0000 mg | ORAL_TABLET | Freq: Every day | ORAL | Status: DC
  Administered 2023-09-13 – 2023-09-15 (×3): 150 mg via ORAL
  Filled 2023-09-13 (×3): qty 1

## 2023-09-13 NOTE — TOC Progression Note (Signed)
 Transition of Care [facility]) - Progression Note    Patient Details  Name: [name] MRN: [id number] Date of Birth: May 31, 1956  Transition of [facility]) CM/SW Contact  Marlowe Sax, RN Phone Number: 09/13/2023, 4:31 PM  Clinical Narrative:     Will be on IV ABX daptomycin 700mg  IV q24h and cefepime 2gm IV q12h until 10/19/23  Will get ins approval for [employer]       Expected Discharge Plan and Services                                               Social Determinants of Health (SDOH) Interventions SDOH Screenings   Food Insecurity: No Food Insecurity (09/05/2023)  Housing: Low Risk  (09/05/2023)  Transportation Needs: No Transportation Needs (09/05/2023)  Utilities: Not At Risk (09/05/2023)  Alcohol Screen: Low Risk  (03/10/2021)  Depression (PHQ2-9): Low Risk  (03/10/2021)  Financial Resource Strain: Low Risk  (03/10/2021)  Physical Activity: Inactive (03/10/2021)  Social Connections: Socially Isolated (09/05/2023)  Stress: No Stress Concern Present (03/10/2021)  Tobacco Use: Low Risk  (09/04/2023)    Readmission Risk Interventions    11/03/2021   12:41 PM  Readmission Risk Prevention Plan  Transportation Screening Complete  PCP or Specialist Appt within 3-5 Days Complete  HRI or Home Care Consult Complete  Social Work Consult for Recovery Care Planning/Counseling Complete  Palliative Care Screening Complete  Medication Review Ocean[employer]) Complete

## 2023-09-13 NOTE — Plan of Care (Signed)
  Problem: Education: Goal: Ability to describe self-care measures that may prevent or decrease complications (Diabetes Survival Skills Education) will improve Outcome: Progressing   Problem: Fluid Volume: Goal: Ability to maintain a balanced intake and output will improve Outcome: Progressing   Problem: Health Behavior/Discharge Planning: Goal: Ability to manage health-related needs will improve Outcome: Progressing   Problem: Metabolic: Goal: Ability to maintain appropriate glucose levels will improve Outcome: Progressing   Problem: Nutritional: Goal: Maintenance of adequate nutrition will improve Outcome: Progressing Goal: Progress toward achieving an optimal weight will improve Outcome: Progressing   Problem: Skin Integrity: Goal: Risk for impaired skin integrity will decrease Outcome: Progressing   Problem: Tissue Perfusion: Goal: Adequacy of tissue perfusion will improve Outcome: Progressing   Problem: Education: Goal: Knowledge of General Education information will improve Description: Including pain rating scale, medication(s)/side effects and non-pharmacologic comfort measures Outcome: Progressing   Problem: Health Behavior/Discharge Planning: Goal: Ability to manage health-related needs will improve Outcome: Progressing   Problem: Clinical Measurements: Goal: Ability to maintain clinical measurements within normal limits will improve Outcome: Progressing Goal: Will remain free from infection Outcome: Progressing Goal: Diagnostic test results will improve Outcome: Progressing Goal: Respiratory complications will improve Outcome: Progressing Goal: Cardiovascular complication will be avoided Outcome: Progressing   Problem: Activity: Goal: Risk for activity intolerance will decrease Outcome: Progressing   Problem: Nutrition: Goal: Adequate nutrition will be maintained Outcome: Progressing   Problem: Coping: Goal: Level of anxiety will  decrease Outcome: Progressing   Problem: Elimination: Goal: Will not experience complications related to bowel motility Outcome: Progressing Goal: Will not experience complications related to urinary retention Outcome: Progressing   Problem: Pain Managment: Goal: General experience of comfort will improve and/or be controlled Outcome: Progressing   Problem: Safety: Goal: Ability to remain free from injury will improve Outcome: Progressing   Problem: Skin Integrity: Goal: Risk for impaired skin integrity will decrease Outcome: Progressing   Problem: Clinical Measurements: Goal: Ability to avoid or minimize complications of infection will improve Outcome: Progressing   Problem: Skin Integrity: Goal: Skin integrity will improve Outcome: Progressing

## 2023-09-13 NOTE — Progress Notes (Signed)
 PHARMACY CONSULT NOTE FOR:  OUTPATIENT  PARENTERAL ANTIBIOTIC THERAPY (OPAT)  Indication: L leg (fibula) osteomyelitis Regimen: daptomycin 700mg  IV q24h (8mg /kg per adjusted Body weight) PLUS cefepime 2gm IV q12h End date: 10/19/2023  Labs - Once weekly:  CBC/D, CMP, CPK, ESR and CRP Fax weekly lab results  promptly to [phone]  Please pull PIC at completion of IV antibiotics Call [phone] with critical values or questions While on Daptomycin watch for rhabdomyolysis and eosinophilia and pneumonia   IV antibiotic discharge orders are pended. To discharging provider:  please sign these orders via discharge navigator,  Select New Orders & click on the button choice - Manage This Unsigned Work.     Thank you for allowing pharmacy to be a part of this patient's care.  Juliette Alcide, PharmD, BCPS, BCIDP Work Cell: [phone] 09/13/2023 4:05 PM

## 2023-09-13 NOTE — Progress Notes (Signed)
 Date of Admission:  09/04/2023      ID: [name] is a 68 y.o. female Principal Problem:   Osteomyelitis of left fibula (HCC) Active Problems:   Essential hypertension   Hypothyroidism   Acute kidney injury superimposed on CKD (HCC)   PVD (peripheral vascular disease) (HCC)   Type II diabetes mellitus with renal manifestations (HCC)   Chronic systolic CHF (congestive heart failure) (HCC)   Obesity, Class III, BMI 40-49.9 (morbid obesity) (HCC)   Thrombocytosis   Anemia of chronic disease   Multiple open wounds of lower leg, initial encounter    Subjective: Patient doing okay  Medications:   acetaminophen  650 mg Oral Q6H   amLODipine  5 mg Oral Daily   ascorbic acid  500 mg Oral Daily   Chlorhexidine Gluconate Cloth  6 each Topical Daily   cloNIDine  0.2 mg Oral TID   clopidogrel  75 mg Oral Q breakfast   enoxaparin (LOVENOX) injection  65 mg Subcutaneous Q24H   ferrous gluconate  324 mg Oral Q breakfast   furosemide  20 mg Oral Daily   gabapentin  300 mg Oral QHS   Gerhardt's butt cream   Topical Daily   insulin aspart  0-5 Units Subcutaneous QHS   insulin aspart  0-9 Units Subcutaneous TID WC   insulin aspart  3 Units Subcutaneous TID WC   insulin glargine-yfgn  14 Units Subcutaneous BID   irbesartan  150 mg Oral Daily   levothyroxine  200 mcg Oral Q0600   metoprolol succinate  12.5 mg Oral Q breakfast   multivitamin with minerals  1 tablet Oral Daily   sodium chloride flush  10-40 mL Intracatheter Q12H    Objective: Vital signs in last 24 hours: Patient Vitals for the past 24 hrs:  BP Temp Temp src Pulse Resp SpO2  09/13/23 0334 (!) 129/35 -- -- 70 -- 99 %  09/13/23 0334 -- 98 F (36.7 C) Oral -- -- --  09/12/23 2230 (!) 134/59 -- -- -- -- --  09/12/23 2109 (!) 134/59 (!) 97.4 F (36.3 C) -- 91 17 100 %     LDA Right PICC line  PHYSICAL EXAM:  General: Alert, cooperative, no distress, appears stated age.  Lungs: Clear to auscultation  bilaterally. No Wheezing or Rhonchi. No rales. Heart: Regular rate and rhythm, no murmur, rub or gallop. Abdomen: Soft, non-tender,not distended. Bowel sounds normal. No masses Extremities: Bilateral venous  ulceration         Skin: No rashes or lesions. Or bruising Lymph: Cervical, supraclavicular normal. Neurologic: Grossly non-focal  Lab Results    Latest Ref Rng & Units 09/12/2023    4:03 AM 09/09/2023    2:17 AM 09/08/2023    5:32 AM  CBC  WBC 4.0 - 10.5 K/uL 8.2  9.8  8.9   Hemoglobin 12.0 - 15.0 g/dL 9.7  40.9  81.1   Hematocrit 36.0 - 46.0 % 31.5  32.9  32.0   Platelets 150 - 400 K/uL 494  626  490        Latest Ref Rng & Units 09/12/2023    4:03 AM 09/09/2023    2:17 AM 09/08/2023    5:32 AM  CMP  Glucose 70 - 99 mg/dL 914  782  956   BUN 8 - 23 mg/dL 30  37  30   Creatinine 0.44 - 1.00 mg/dL 2.13  0.86  5.78   Sodium 135 - 145 mmol/L 134  134  136   Potassium 3.5 - 5.1 mmol/L 4.3  4.3  4.2   Chloride 98 - 111 mmol/L 103  101  102   CO2 22 - 32 mmol/L 24  23  24    Calcium 8.9 - 10.3 mg/dL 8.8  8.7  8.7       Microbiology: Wound culture provide NG and Staph aureus Studies/Results: [name] EKG SITE RITE Result Date: 09/12/2023 If Site Rite image not attached, placement could not be confirmed due to current cardiac rhythm.    Assessment/Plan: Chronic venous stasis.ulceration of both legs Left leg pressure ulceration on the lateral aspect leading to a linear ulcer and underlying fibula osteomyelitis-with underlying PAD  very unusual presentation Requested for fibular biopsy but that is not possible Doubt  this will heal just with antibiotics? Need to offload the leg  ? Hyperbaric oxygen On  dapto and cefepime X 6 weeks Will continue these 2 antibiotics as we dont have bone biopsy/culture and superficial culture doe snot mean anything with chronic wounds and colonozation    Anemia CKD Very high ESR> 140- could be from osteo , but with CKD, anemia need to r/o  Multiple myeloma ? ?Severe PAD- s/p stenting of lower extremity On plavix   DM on insulin   HTN on amlodipine Metoprolol   Hypothyroidism on synthroid   Discussed the management with the patient and the hospitalist. OPAT orders  written ID will sign off- will follow as OP

## 2023-09-13 NOTE — Plan of Care (Signed)
   Problem: Fluid Volume: Goal: Ability to maintain a balanced intake and output will improve Outcome: Progressing   Problem: Nutritional: Goal: Maintenance of adequate nutrition will improve Outcome: Progressing

## 2023-09-13 NOTE — Progress Notes (Signed)
 PT Cancellation Note  Patient Details Name: [name] MRN: [id number] DOB: 29-Jan-1956   Cancelled Treatment:    Reason Eval/Treat Not Completed: PT screened, no needs identified, will sign off.  Per chart review pt is from LTC facility & refuses OOB mobility while there. Pt does not require acute skilled PT services at this time. PT to complete current order, MD notified.    Ovidio Hanger PT, DPT 09/13/23, 8:36 AM

## 2023-09-13 NOTE — Treatment Plan (Signed)
 Diagnosis: Leg ulcer with osteomyelitis of the fibula on the left side  Baseline Creatinine 1.45  Culture Result: No bone culture was done Only superficial wound cluture which was staph aureus and providencia  No Known Allergies  OPAT Orders Discharge antibiotics: Cefepime 2 grams IV q 12 Daptomycin 700mg  IV  every 24 hours ( current dose is 8mg /kg adjusted body weight of 87kg)  Adjust both antibiotics according to creatinine and crcl Duration: 6 weeks End Date: 10/19/23   While on Dapto watch for rhabdomyolysis and eosinophilia and pneumonia   PIC Care Per Protocol:  Labs weekly while on IV antibiotics: _X_ CBC with differential _X_ CK ( creatinine kinase) _X_ CMP _X_ CRP _X_ ESR   _X_ Please pull PIC at completion of IV antibiotics  Fax weekly lab results  promptly to [phone]  Clinic Follow Up Appt: 10/10/23 at 11.30- MY chart Video visit   Call [phone] with critical values or questions

## 2023-09-13 NOTE — Progress Notes (Signed)
 Progress Note   Patient: [name]:[id number] DOB: Mar 29, 1956 DOA: 09/04/2023     9 DOS: the patient was seen and examined on 09/13/2023   Brief hospital course: 68 year old female past medical history of chronic wounds bilateral legs, systolic congestive heart failure with EF 45%, chronic lymphedema, hypertension, type 2 diabetes mellitus, peripheral vascular disease, hypothyroidism, CKD stage IIIa presents with leg wounds and leg pain.  Patient has chronic wounds both legs since 2022 and following up at wound care.  Patient admitted to the hospital on 09/04/2023.  Looks like initially started on antibiotics but then discontinued.  ABI negative.  Sedimentation rate greater than 140  3/15.  MRI left leg reviewed showing osteomyelitis.  Case discussed with infectious disease specialist and will start Rocephin and Zyvox. 3/16.  Wound culture as a few gram-positive cocci in pairs and a few gram variable rod. 3/17. Wound culture still showing gram-positive cocci in pairs and gram variable rod 3/18.  Wound culture growing corynebacterium, Staph aureus and Providencia stuartii  I assumed care 09/13/23.   Assessment and Plan: *Osteomyelitis of left fibula  MRI resulted on 3/15 consistent with osteomyelitis left leg.   Unable to get a fibula biopsy. Wound culture on 3/15 growing corynebacterium, Staph aureus and Providencia stuartii.  ID changed antibiotics to daptomycin and Maxipime.    ESR on admission greater than 140 and now down to 108.   Blood cultures negative for 5 days.   --On Cefepime and Daptomycin per ID --Following final susceptibilities for discharge antibiotic plan  Acute kidney injury superimposed on CKD  AKI on CKD stage IIIa.   Creatinine on presentation 1.47 >> peaked at 1.89 on 3/12.   Now stabilized at baseline. --Monitor BMP  Chronic systolic CHF EF 45% on last echo in chart back in 2018. --On low-dose Toprol and Lasix.    Type II diabetes mellitus with  renal manifestations  3/19 - CBG"s above goal --Increase Semglee insulin 12 >> 14 units BID  --Continue Novolog 3 units TID WC + sliding scale 0-9 WC / 0-5 HS sliding scale Novolog  Essential hypertension --On Lasix, metoprolol, clonidine (decreased dose) and Norvasc.  Hypothyroidism --On Synthroid  PVD (peripheral vascular disease) --On Plavix  Obesity, Class III, BMI 40-49.9 (morbid obesity)  BMI 48.76 --Complicates overall care and prognosis.  Recommend lifestyle modifications including physical activity and diet for weight loss and overall long-term health.  Anemia of chronic disease Ferritin 107 and last hemoglobin 9.7. --Monitor CBC  Thrombocytosis Likely secondary to osteomyelitis. --Monitor CBC      Subjective: Patient feels okay.  Has some pain in her legs.  Admitted with lower extremity wounds and found to have osteomyelitis.  Physical Exam: Vitals:   09/12/23 2109 09/12/23 2230 09/13/23 0334 09/13/23 0334  BP: (!) 134/59 (!) 134/59  (!) 129/35  Pulse: 91   70  Resp: 17     Temp: (!) 97.4 F (36.3 C)  98 F (36.7 C)   TempSrc:   Oral   SpO2: 100%   99%  Weight:      Height:        General exam: awake, alert, no acute distress HEENT: moist mucus membranes, hearing grossly normal  Respiratory system: CTAB no wheezes, rales or rhonchi, normal respiratory effort. Cardiovascular system: normal S1/S2,  RRR Gastrointestinal system: soft, NT, ND Central nervous system: A&O x 3. no gross focal neurologic deficits, normal speech Extremities: BLE's with ace wraps Skin: dry, intact, normal temperature Psychiatry: normal mood, congruent affect,  judgement and insight appear normal     Data Reviewed:  Notable labs today -- CK 13 CBG's above goal intermittently: 182 >> 229 >> 211 >> 157 >> 197   Family Communication: spoke with [name]  Disposition: Status is: Inpatient Remains inpatient appropriate because: awaiting final sensitivities   Planned  Discharge Destination: return to LTC and [facility]     Time spent: 38 minutes  Author: Pennie Banter, DO 09/13/2023 12:34 PM  For on call review [email or web address].

## 2023-09-14 DIAGNOSIS — I5022 Chronic systolic (congestive) heart failure: Secondary | ICD-10-CM | POA: Diagnosis not present

## 2023-09-14 DIAGNOSIS — M86162 Other acute osteomyelitis, left tibia and fibula: Secondary | ICD-10-CM | POA: Diagnosis not present

## 2023-09-14 DIAGNOSIS — I10 Essential (primary) hypertension: Secondary | ICD-10-CM | POA: Diagnosis not present

## 2023-09-14 DIAGNOSIS — E1122 Type 2 diabetes mellitus with diabetic chronic kidney disease: Secondary | ICD-10-CM | POA: Diagnosis not present

## 2023-09-14 LAB — AEROBIC/ANAEROBIC CULTURE W GRAM STAIN (SURGICAL/DEEP WOUND): Culture: NO GROWTH

## 2023-09-14 LAB — GLUCOSE, CAPILLARY
Glucose-Capillary: 144 mg/dL — ABNORMAL HIGH (ref 70–99)
Glucose-Capillary: 112 mg/dL — ABNORMAL HIGH (ref 70–99)
Glucose-Capillary: 103 mg/dL — ABNORMAL HIGH (ref 70–99)
Glucose-Capillary: 103 mg/dL — ABNORMAL HIGH (ref 70–99)

## 2023-09-14 LAB — CREATININE, SERUM
Creatinine, Ser: 1.05 mg/dL — ABNORMAL HIGH (ref 0.44–1.00)
GFR, Estimated: 57 mL/min — ABNORMAL LOW (ref >60)

## 2023-09-14 NOTE — Progress Notes (Signed)
 Progress Note   Patient: [name] JWJ:[id number] DOB: 08-Sep-1955 DOA: 09/04/2023     10 DOS: the patient was seen and examined on 09/14/2023   Brief hospital course: 68 year old female past medical history of chronic wounds bilateral legs, systolic congestive heart failure with EF 45%, chronic lymphedema, hypertension, type 2 diabetes mellitus, peripheral vascular disease, hypothyroidism, CKD stage IIIa presents with leg wounds and leg pain.  Patient has chronic wounds both legs since 2022 and following up at wound care.  Patient admitted to the hospital on 09/04/2023.  Looks like initially started on antibiotics but then discontinued.  ABI negative.  Sedimentation rate greater than 140  3/15.  MRI left leg reviewed showing osteomyelitis.  Case discussed with infectious disease specialist and will start Rocephin and Zyvox. 3/16.  Wound culture as a few gram-positive cocci in pairs and a few gram variable rod. 3/17. Wound culture still showing gram-positive cocci in pairs and gram variable rod 3/18.  Wound culture growing corynebacterium, Staph aureus and Providencia stuartii  I assumed care 09/13/23.  3/20 --- final d/c antibiotic plan is finalized per ID.  Awaiting auth to return to LTC facility on IV antibiotics.   Assessment and Plan: *Osteomyelitis of left fibula  MRI resulted on 3/15 consistent with osteomyelitis left leg.   Unable to get a fibula biopsy. Wound culture on 3/15 growing corynebacterium, Staph aureus and Providencia stuartii.  ID changed antibiotics to daptomycin and Maxipime.    ESR on admission greater than 140 and now down to 108.   Blood cultures negative for 5 days.   --On Cefepime and Daptomycin per ID --Following final susceptibilities for discharge antibiotic plan  Acute kidney injury superimposed on CKD  AKI on CKD stage IIIa.   Creatinine on presentation 1.47 >> peaked at 1.89 on 3/12.   Now stabilized at baseline. --Monitor BMP  Chronic systolic  CHF EF 62% on last echo in chart back in 2018. --On low-dose Toprol and Lasix.    Type II diabetes mellitus with renal manifestations  3/19 - CBG"s above goal --Continue Semglee 14 units BID  --Continue Novolog 3 units TID WC + sliding scale 0-9 WC / 0-5 HS sliding scale Novolog  Essential hypertension --On Lasix, metoprolol, clonidine (decreased dose) and Norvasc.  Hypothyroidism --On Synthroid  PVD (peripheral vascular disease) --On Plavix  Obesity, Class III, BMI 40-49.9 (morbid obesity)  BMI 48.76 --Complicates overall care and prognosis.  Recommend lifestyle modifications including physical activity and diet for weight loss and overall long-term health.  Anemia of chronic disease Ferritin 107 and last hemoglobin 9.7. --Monitor CBC  Thrombocytosis Likely secondary to osteomyelitis. --Monitor CBC      Subjective: Patient sleeping this AM, wakes to voice briefly.  No acute complaints or issues.  Physical Exam: Vitals:   09/13/23 2147 09/14/23 0459 09/14/23 0845 09/14/23 0845  BP: (!) 118/35 (!) 148/58 (!) 157/55 (!) 157/55  Pulse: 77 85 (!) 109 (!) 110  Resp: 16 16 19 19   Temp: 98.1 F (36.7 C) (!) 97.5 F (36.4 C) 98.2 F (36.8 C) 98.2 F (36.8 C)  TempSrc:      SpO2: 98% 100% 100% 100%  Weight:      Height:        General exam: sleeping comfortably, wakes to voice briefly, no acute distress HEENT: moist mucus membranes, hearing grossly normal  Respiratory system: CTAB no wheezes, rales or rhonchi, normal respiratory effort. Cardiovascular system: normal S1/S2,  RRR Gastrointestinal system: soft, NT, ND Central nervous system:  no gross focal neurologic deficits, normal speech Extremities: BLE's with ace wraps Skin: dry, intact, normal temperature     Data Reviewed:  Notable labs --  Cr 1.05, GFR 57  CBG's above goal intermittently: 157 >> 197 >> 233 >> 258 >> 144   Family Communication: spoke with [name]  Disposition: Status is:  Inpatient Remains inpatient appropriate because: awaiting final sensitivities   Planned Discharge Destination: return to LTC at [facility]     Time spent: 35 minutes  Author: Pennie Banter, DO 09/14/2023 11:05 AM  For on call review [email or web address].

## 2023-09-14 NOTE — Plan of Care (Signed)
  Problem: Education: Goal: Ability to describe self-care measures that may prevent or decrease complications (Diabetes Survival Skills Education) will improve Outcome: Progressing   Problem: Skin Integrity: Goal: Risk for impaired skin integrity will decrease Outcome: Progressing   Problem: Safety: Goal: Ability to remain free from injury will improve Outcome: Progressing   Problem: Skin Integrity: Goal: Risk for impaired skin integrity will decrease Outcome: Progressing   Problem: Education: Goal: Ability to describe self-care measures that may prevent or decrease complications (Diabetes Survival Skills Education) will improve Outcome: Progressing   Problem: Skin Integrity: Goal: Risk for impaired skin integrity will decrease Outcome: Progressing   Problem: Safety: Goal: Ability to remain free from injury will improve Outcome: Progressing   Problem: Skin Integrity: Goal: Risk for impaired skin integrity will decrease Outcome: Progressing

## 2023-09-14 NOTE — TOC Progression Note (Signed)
 Transition of Care [facility]) - Progression Note    Patient Details  Name: [name] MRN: [id number] Date of Birth: 12-05-1955  [facility] of [facility]) CM/SW Contact  Marlowe Sax, RN Phone Number: 09/14/2023, 3:02 PM  Clinical Narrative:    Ins approved the patient to go to [facility] 3/21-3/27        Expected Discharge Plan and Services                                               Social Determinants of Health (SDOH) Interventions SDOH Screenings   Food Insecurity: No Food Insecurity (09/05/2023)  Housing: Low Risk  (09/05/2023)  Transportation Needs: No Transportation Needs (09/05/2023)  Utilities: Not At Risk (09/05/2023)  Alcohol Screen: Low Risk  (03/10/2021)  Depression (PHQ2-9): Low Risk  (03/10/2021)  Financial Resource Strain: Low Risk  (03/10/2021)  Physical Activity: Inactive (03/10/2021)  Social Connections: Socially Isolated (09/05/2023)  Stress: No Stress Concern Present (03/10/2021)  Tobacco Use: Low Risk  (09/04/2023)    Readmission Risk Interventions    11/03/2021   12:41 PM  Readmission Risk Prevention Plan  Transportation Screening Complete  PCP or Specialist Appt within 3-5 Days Complete  HRI or Home Care Consult Complete  Social Work Consult for Recovery Care Planning/Counseling Complete  Palliative Care Screening Complete  Medication Review Oceanographer) Complete

## 2023-09-15 DIAGNOSIS — G9341 Metabolic encephalopathy: Secondary | ICD-10-CM | POA: Diagnosis not present

## 2023-09-15 DIAGNOSIS — E871 Hypo-osmolality and hyponatremia: Secondary | ICD-10-CM | POA: Diagnosis not present

## 2023-09-15 DIAGNOSIS — T40601A Poisoning by unspecified narcotics, accidental (unintentional), initial encounter: Secondary | ICD-10-CM | POA: Diagnosis not present

## 2023-09-15 DIAGNOSIS — R4182 Altered mental status, unspecified: Secondary | ICD-10-CM | POA: Diagnosis not present

## 2023-09-15 DIAGNOSIS — I509 Heart failure, unspecified: Secondary | ICD-10-CM | POA: Diagnosis not present

## 2023-09-15 DIAGNOSIS — I10 Essential (primary) hypertension: Secondary | ICD-10-CM | POA: Diagnosis not present

## 2023-09-15 DIAGNOSIS — I6782 Cerebral ischemia: Secondary | ICD-10-CM | POA: Diagnosis not present

## 2023-09-15 DIAGNOSIS — Z66 Do not resuscitate: Secondary | ICD-10-CM | POA: Diagnosis not present

## 2023-09-15 DIAGNOSIS — N1832 Chronic kidney disease, stage 3b: Secondary | ICD-10-CM | POA: Diagnosis not present

## 2023-09-15 DIAGNOSIS — D631 Anemia in chronic kidney disease: Secondary | ICD-10-CM | POA: Diagnosis not present

## 2023-09-15 DIAGNOSIS — Z794 Long term (current) use of insulin: Secondary | ICD-10-CM | POA: Diagnosis not present

## 2023-09-15 DIAGNOSIS — D509 Iron deficiency anemia, unspecified: Secondary | ICD-10-CM | POA: Diagnosis not present

## 2023-09-15 DIAGNOSIS — M86162 Other acute osteomyelitis, left tibia and fibula: Secondary | ICD-10-CM | POA: Diagnosis not present

## 2023-09-15 DIAGNOSIS — A4102 Sepsis due to Methicillin resistant Staphylococcus aureus: Secondary | ICD-10-CM | POA: Diagnosis not present

## 2023-09-15 DIAGNOSIS — N179 Acute kidney failure, unspecified: Secondary | ICD-10-CM | POA: Diagnosis not present

## 2023-09-15 DIAGNOSIS — R0689 Other abnormalities of breathing: Secondary | ICD-10-CM | POA: Diagnosis not present

## 2023-09-15 DIAGNOSIS — L89152 Pressure ulcer of sacral region, stage 2: Secondary | ICD-10-CM | POA: Diagnosis not present

## 2023-09-15 DIAGNOSIS — L97919 Non-pressure chronic ulcer of unspecified part of right lower leg with unspecified severity: Secondary | ICD-10-CM | POA: Diagnosis not present

## 2023-09-15 DIAGNOSIS — E1151 Type 2 diabetes mellitus with diabetic peripheral angiopathy without gangrene: Secondary | ICD-10-CM | POA: Diagnosis not present

## 2023-09-15 DIAGNOSIS — T148XXA Other injury of unspecified body region, initial encounter: Secondary | ICD-10-CM | POA: Diagnosis not present

## 2023-09-15 DIAGNOSIS — E039 Hypothyroidism, unspecified: Secondary | ICD-10-CM | POA: Diagnosis not present

## 2023-09-15 DIAGNOSIS — I5022 Chronic systolic (congestive) heart failure: Secondary | ICD-10-CM | POA: Diagnosis not present

## 2023-09-15 DIAGNOSIS — R6520 Severe sepsis without septic shock: Secondary | ICD-10-CM | POA: Diagnosis not present

## 2023-09-15 DIAGNOSIS — G928 Other toxic encephalopathy: Secondary | ICD-10-CM | POA: Diagnosis not present

## 2023-09-15 DIAGNOSIS — M869 Osteomyelitis, unspecified: Secondary | ICD-10-CM | POA: Diagnosis not present

## 2023-09-15 DIAGNOSIS — F330 Major depressive disorder, recurrent, mild: Secondary | ICD-10-CM | POA: Diagnosis not present

## 2023-09-15 DIAGNOSIS — L299 Pruritus, unspecified: Secondary | ICD-10-CM | POA: Diagnosis not present

## 2023-09-15 DIAGNOSIS — E7220 Disorder of urea cycle metabolism, unspecified: Secondary | ICD-10-CM | POA: Diagnosis not present

## 2023-09-15 DIAGNOSIS — Z515 Encounter for palliative care: Secondary | ICD-10-CM | POA: Diagnosis not present

## 2023-09-15 DIAGNOSIS — E1122 Type 2 diabetes mellitus with diabetic chronic kidney disease: Secondary | ICD-10-CM | POA: Diagnosis not present

## 2023-09-15 DIAGNOSIS — N189 Chronic kidney disease, unspecified: Secondary | ICD-10-CM | POA: Diagnosis not present

## 2023-09-15 DIAGNOSIS — M6281 Muscle weakness (generalized): Secondary | ICD-10-CM | POA: Diagnosis not present

## 2023-09-15 DIAGNOSIS — F411 Generalized anxiety disorder: Secondary | ICD-10-CM | POA: Diagnosis not present

## 2023-09-15 DIAGNOSIS — R569 Unspecified convulsions: Secondary | ICD-10-CM | POA: Diagnosis not present

## 2023-09-15 DIAGNOSIS — E1129 Type 2 diabetes mellitus with other diabetic kidney complication: Secondary | ICD-10-CM | POA: Diagnosis not present

## 2023-09-15 DIAGNOSIS — G253 Myoclonus: Secondary | ICD-10-CM | POA: Diagnosis present

## 2023-09-15 DIAGNOSIS — N170 Acute kidney failure with tubular necrosis: Secondary | ICD-10-CM | POA: Diagnosis not present

## 2023-09-15 DIAGNOSIS — Z1152 Encounter for screening for COVID-19: Secondary | ICD-10-CM | POA: Diagnosis not present

## 2023-09-15 DIAGNOSIS — E8721 Acute metabolic acidosis: Secondary | ICD-10-CM | POA: Diagnosis not present

## 2023-09-15 DIAGNOSIS — J9601 Acute respiratory failure with hypoxia: Secondary | ICD-10-CM | POA: Diagnosis not present

## 2023-09-15 DIAGNOSIS — L97929 Non-pressure chronic ulcer of unspecified part of left lower leg with unspecified severity: Secondary | ICD-10-CM | POA: Diagnosis not present

## 2023-09-15 DIAGNOSIS — I83029 Varicose veins of left lower extremity with ulcer of unspecified site: Secondary | ICD-10-CM | POA: Diagnosis not present

## 2023-09-15 DIAGNOSIS — Z6841 Body Mass Index (BMI) 40.0 and over, adult: Secondary | ICD-10-CM | POA: Diagnosis not present

## 2023-09-15 DIAGNOSIS — A419 Sepsis, unspecified organism: Secondary | ICD-10-CM | POA: Diagnosis not present

## 2023-09-15 DIAGNOSIS — M8618 Other acute osteomyelitis, other site: Secondary | ICD-10-CM | POA: Diagnosis not present

## 2023-09-15 DIAGNOSIS — E559 Vitamin D deficiency, unspecified: Secondary | ICD-10-CM | POA: Diagnosis not present

## 2023-09-15 DIAGNOSIS — Z95828 Presence of other vascular implants and grafts: Secondary | ICD-10-CM | POA: Diagnosis not present

## 2023-09-15 DIAGNOSIS — I739 Peripheral vascular disease, unspecified: Secondary | ICD-10-CM | POA: Diagnosis not present

## 2023-09-15 DIAGNOSIS — J9811 Atelectasis: Secondary | ICD-10-CM | POA: Diagnosis not present

## 2023-09-15 DIAGNOSIS — N1831 Chronic kidney disease, stage 3a: Secondary | ICD-10-CM | POA: Diagnosis not present

## 2023-09-15 DIAGNOSIS — Z7401 Bed confinement status: Secondary | ICD-10-CM | POA: Diagnosis not present

## 2023-09-15 DIAGNOSIS — I959 Hypotension, unspecified: Secondary | ICD-10-CM | POA: Diagnosis not present

## 2023-09-15 DIAGNOSIS — Z452 Encounter for adjustment and management of vascular access device: Secondary | ICD-10-CM | POA: Diagnosis not present

## 2023-09-15 DIAGNOSIS — L97909 Non-pressure chronic ulcer of unspecified part of unspecified lower leg with unspecified severity: Secondary | ICD-10-CM | POA: Diagnosis not present

## 2023-09-15 DIAGNOSIS — G8929 Other chronic pain: Secondary | ICD-10-CM | POA: Diagnosis not present

## 2023-09-15 DIAGNOSIS — I83019 Varicose veins of right lower extremity with ulcer of unspecified site: Secondary | ICD-10-CM | POA: Diagnosis not present

## 2023-09-15 DIAGNOSIS — I130 Hypertensive heart and chronic kidney disease with heart failure and stage 1 through stage 4 chronic kidney disease, or unspecified chronic kidney disease: Secondary | ICD-10-CM | POA: Diagnosis not present

## 2023-09-15 DIAGNOSIS — G9340 Encephalopathy, unspecified: Secondary | ICD-10-CM | POA: Diagnosis not present

## 2023-09-15 LAB — GLUCOSE, CAPILLARY
Glucose-Capillary: 109 mg/dL — ABNORMAL HIGH (ref 70–99)
Glucose-Capillary: 152 mg/dL — ABNORMAL HIGH (ref 70–99)

## 2023-09-15 MED ORDER — HYDROMORPHONE HCL 2 MG PO TABS: 1.0000 mg | ORAL_TABLET | ORAL | 0 refills | Status: DC | PRN

## 2023-09-15 MED ORDER — GABAPENTIN 300 MG PO CAPS: 300.0000 mg | ORAL_CAPSULE | Freq: Every day | ORAL | Status: AC

## 2023-09-15 MED ORDER — DAPTOMYCIN IV (FOR PTA / DISCHARGE USE ONLY): 700.0000 mg | INTRAVENOUS | 0 refills | Status: DC

## 2023-09-15 MED ORDER — LANTUS SOLOSTAR 100 UNIT/ML ~~LOC~~ SOPN: 14.0000 [IU] | PEN_INJECTOR | Freq: Two times a day (BID) | SUBCUTANEOUS | Status: DC

## 2023-09-15 MED ORDER — GERHARDT'S BUTT CREAM: 1.0000 | TOPICAL_CREAM | Freq: Every day | CUTANEOUS | Status: DC

## 2023-09-15 MED ORDER — CAMPHOR-MENTHOL 0.5-0.5 % EX LOTN: TOPICAL_LOTION | CUTANEOUS | Status: DC | PRN

## 2023-09-15 MED ORDER — HYDROMORPHONE HCL 2 MG PO TABS: 2.0000 mg | ORAL_TABLET | Freq: Two times a day (BID) | ORAL | 0 refills | Status: DC | PRN

## 2023-09-15 MED ORDER — CEFEPIME IV (FOR PTA / DISCHARGE USE ONLY): 2.0000 g | Freq: Two times a day (BID) | INTRAVENOUS | 0 refills | Status: DC

## 2023-09-15 NOTE — TOC Progression Note (Signed)
 Transition of Care [facility]) - Progression Note    Patient Details  Name: [name] MRN: [id number] Date of Birth: May 22, 1956  [facility]) CM/SW Contact  Marlowe Sax, RN Phone Number: 09/15/2023, 10:27 AM  Clinical Narrative:    Called [facility] to arrange EMS for Patietn to go to [facility] room 104        Expected Discharge Plan and Services         Expected Discharge Date: 09/15/23                                     Social Determinants of Health (SDOH) Interventions SDOH Screenings   Food Insecurity: No Food Insecurity (09/05/2023)  Housing: Low Risk  (09/05/2023)  Transportation Needs: No Transportation Needs (09/05/2023)  Utilities: Not At Risk (09/05/2023)  Alcohol Screen: Low Risk  (03/10/2021)  Depression (PHQ2-9): Low Risk  (03/10/2021)  Financial Resource Strain: Low Risk  (03/10/2021)  Physical Activity: Inactive (03/10/2021)  Social Connections: Socially Isolated (09/05/2023)  Stress: No Stress Concern Present (03/10/2021)  Tobacco Use: Low Risk  (09/04/2023)    Readmission Risk Interventions    11/03/2021   12:41 PM  Readmission Risk Prevention Plan  Transportation Screening Complete  PCP or Specialist Appt within 3-5 Days Complete  HRI or Home Care Consult Complete  Social Work Consult for Recovery Care Planning/Counseling Complete  Palliative Care Screening Complete  Medication Review Oceanographer) Complete

## 2023-09-15 NOTE — Progress Notes (Signed)
 Pt refused dressing change this morning, stated that she would like to have it done later.

## 2023-09-15 NOTE — Consult Note (Signed)
 [facility] Liaison Note  09/15/2023  SURAIYA DICKERSON 09-13-1955 [id number]  Location: RN Hospital Liaison screened the patient remotely at [facility]  Insurance: [employer]   RONAN DION is a 68 y.o. female who is a Primary Care Patient of Pcp, No The patient was screened for readmission hospitalization with noted medium risk score for unplanned readmission risk with 1 IP in 6 months.  The patient was assessed for potential Care Management service needs for post hospital transition for care coordination. Review of patient's electronic medical record due to green banner and reveals patient was admitted with Osteomyelitis. Pt will return to [facility] for rehabilitation as she is a resident at this facility. Facility will continue to address pt's ongoing needs.   [facility] Care Management/Population Health does not replace or interfere with any arrangements made by the Inpatient Transition of Care team.   For questions contact:   Elliot Cousin, RN, BSN Hospital Liaison [place]   [facility], Population Health Office Hours MTWF  8:00 am-6:00 pm Direct Dial: [phone] mobile [email or web address]

## 2023-09-15 NOTE — Discharge Summary (Signed)
 Physician Discharge Summary   Patient: [name] MRN: [id number] DOB: Jul 30, 1955  Admit date:     09/04/2023  Discharge date: 09/15/23  Discharge Physician: Pennie Banter   PCP: Pcp, No   Recommendations at discharge:   Continue IV antibiotics through 10/19/2023 Fax weekly labs (CBC w/diff, CK, CMP, CRP, ESR) to [phone] -- while on antibiotics Follow up with Dr. Rivka Safer, Infectious Disease on 10/10/2022 at 11:30 by video chat Follow up with Primary Care in 1-2 weeks Follow up with your other outpatient specialists as previously scheduled Follow up in Wound Care clinic Pending clinical response to IV antibiotics, osteomyelitis of left fibula may require additional measures -- consider if hyperbaric oxygen may be appropriate, versus eventual amputation if ongoing infection/sepsis despite antibiotics  Discharge Diagnoses: Principal Problem:   Osteomyelitis of left fibula (HCC) Active Problems:   Acute kidney injury superimposed on CKD (HCC)   Chronic systolic CHF (congestive heart failure) (HCC)   Essential hypertension   Type II diabetes mellitus with renal manifestations (HCC)   Hypothyroidism   PVD (peripheral vascular disease) (HCC)   Obesity, Class III, BMI 40-49.9 (morbid obesity) (HCC)   Thrombocytosis   Anemia of chronic disease   Multiple open wounds of lower leg, initial encounter  Resolved Problems:   Wound infection  Hospital Course:  68 year old female past medical history of chronic wounds bilateral legs, systolic congestive heart failure with EF 45%, chronic lymphedema, hypertension, type 2 diabetes mellitus, peripheral vascular disease, hypothyroidism, CKD stage IIIa presents with leg wounds and leg pain.  Patient has chronic wounds both legs since 2022 and following up at wound care.   Patient admitted to the hospital on 09/04/2023.  Looks like initially started on antibiotics but then discontinued.  ABI negative.  Sedimentation rate greater than  140   3/15.  MRI left leg reviewed showing osteomyelitis.  Case discussed with infectious disease specialist and will start Rocephin and Zyvox. 3/16.  Wound culture as a few gram-positive cocci in pairs and a few gram variable rod. 3/17. Wound culture still showing gram-positive cocci in pairs and gram variable rod 3/18.  Wound culture growing corynebacterium, Staph aureus and Providencia stuartii   I assumed care 09/13/23.   3/20 --- final d/c antibiotic plan is finalized per ID.  Awaiting auth to return to LTC facility on IV antibiotics.  3/21 -- pt doing well, has no acute complaints.  Per case manager, insurance auth to return to [facility] is received.  Patient is medically stable for discharge today, to remain on IV antibiotics through 10/19/23 and ID follow up 10/10/23.   Assessment and Plan:  *Osteomyelitis of left fibula  MRI resulted on 3/15 consistent with osteomyelitis left leg.   Unable to get a fibula biopsy. Wound culture on 3/15 growing corynebacterium, Staph aureus and Providencia stuartii.   ESR on admission greater than 140, trending down Blood cultures negative and final.   --Infectious disease consulted --- follow up 10/10/23 --Continue IV Cefepime and Daptomycin through 10/19/23 --Weekly labs as above -- fax to ID office    Acute kidney injury superimposed on CKD  AKI on CKD stage IIIa.   Creatinine on presentation 1.47 >> peaked at 1.89 on 3/12.   Now stabilized at baseline - last Cr 1.05 on 3/20. --Monitor BMP   Chronic systolic CHF EF 45% on last echo in chart back in 2018. --On Toprol and Lasix.   --Monitor volume status    Type II diabetes mellitus with renal manifestations  --  Increased basal insulin to 14 units BID  --Continue Novolog 3 units TID WC --PCP follow up to titrate regimen as needed   Essential hypertension --On Lasix, metoprolol, clonidine and Norvasc.   Hypothyroidism --On Synthroid   PVD (peripheral vascular disease) --On Plavix    Obesity, Class III, BMI 40-49.9 (morbid obesity)  BMI 48.76 --Complicates overall care and prognosis.  Recommend lifestyle modifications including physical activity and diet for weight loss and overall long-term health.   Anemia of chronic disease Ferritin 107 and last hemoglobin 9.7. --Monitor CBC   Thrombocytosis Likely reactive, secondary to osteomyelitis. --Monitor CBC           Consultants: Infectious disease Procedures performed: None  Disposition: Long term care facility Diet recommendation:  Discharge Diet Orders (From admission, onward)     Start     Ordered   09/15/23 0000  Diet - carb-modified and heart healthy        09/15/23 0911            DISCHARGE MEDICATION: Allergies as of 09/15/2023   No Known Allergies      Medication List     STOP taking these medications    oxyCODONE 5 MG immediate release tablet Commonly known as: Oxy IR/ROXICODONE       TAKE these medications    (feeding supplement) PROSource Plus liquid Take 30 mLs by mouth 3 (three) times daily between meals.   acetaminophen 325 MG tablet Commonly known as: TYLENOL Take 2 tablets (650 mg total) by mouth every 6 (six) hours as needed for mild pain (or Fever >/= 101).   amLODipine 5 MG tablet Commonly known as: NORVASC Take 5 mg by mouth daily.   ascorbic acid 500 MG tablet Commonly known as: VITAMIN C Take 1 tablet (500 mg total) by mouth daily.   blood glucose meter kit and supplies 1 each by Other route daily. ONE TOUCH ULTRA METER. Use as directed. E11.29   camphor-menthol lotion Commonly known as: SARNA Apply topically as needed for itching.   ceFEPime IVPB Commonly known as: MAXIPIME Inject 2 g into the vein every 12 (twelve) hours. Indication:  L leg (fibula) osteomyelitis Last Day of Therapy:  10/19/2023 Labs - Once weekly:  CBC/D, CMP, CPK, ESR and CRP Fax weekly lab results  promptly to [phone]  Method of administration: IV Push Method of  administration may be changed at the discretion of facility and its pharmacy Please pull PIC at completion of IV antibiotics Call [phone] with critical values or questions   cloNIDine 0.3 MG tablet Commonly known as: CATAPRES TAKE ONE TABLET BY MOUTH 3 TIMES DAILY.   clopidogrel 75 MG tablet Commonly known as: PLAVIX Take 1 tablet (75 mg total) by mouth daily with breakfast.   daptomycin IVPB Commonly known as: CUBICIN Inject 700 mg into the vein daily. Indication:  L leg (fibula) osteomyelitis Last Day of Therapy:  10/19/2023 Labs - Once weekly:  CBC/D, CMP, CPK, ESR and CRP Fax weekly lab results  promptly to [phone]  Method of administration: IV Push Method of administration may be changed at the discretion of facility and its pharmacy Please pull PIC at completion of IV antibiotics Call [phone] with critical values or questions   diclofenac Sodium 1 % Gel Commonly known as: VOLTAREN Apply 2 g topically 3 (three) times daily.   ferrous gluconate 324 MG tablet Commonly known as: FERGON Take 324 mg by mouth daily with breakfast.   furosemide 20 MG tablet Commonly known  as: LASIX Take 1 tablet (20 mg total) by mouth 2 (two) times daily.   gabapentin 300 MG capsule Commonly known as: NEURONTIN Take 1 capsule (300 mg total) by mouth at bedtime.   gentamicin ointment 0.1 % Commonly known as: GARAMYCIN Apply 1 Application topically daily.   Gerhardt's butt cream Crea Apply 1 Application topically daily.   glucose blood test strip 1 each by Other route daily. ONE TOUCH ULTRA TEST STRIPS E11.29   HYDROmorphone 2 MG tablet Commonly known as: DILAUDID Take 0.5 tablets (1 mg total) by mouth every 4 (four) hours as needed for moderate pain (pain score 4-6).   HYDROmorphone 2 MG tablet Commonly known as: DILAUDID Take 1 tablet (2 mg total) by mouth every 12 (twelve) hours as needed (with dressing changes).   hydrOXYzine 10 MG tablet Commonly known as:  ATARAX Take 10 mg by mouth every 8 (eight) hours as needed for itching.   insulin aspart 100 UNIT/ML injection Commonly known as: novoLOG Inject 3 Units into the skin 3 (three) times daily with meals.   irbesartan 150 MG tablet Commonly known as: AVAPRO Take 1 tablet (150 mg total) by mouth daily.   Lantus SoloStar 100 UNIT/ML Solostar Pen Generic drug: insulin glargine Inject 14 Units into the skin every 12 (twelve) hours. What changed: how much to take   levothyroxine 200 MCG tablet Commonly known as: SYNTHROID Take 200 mcg by mouth daily before breakfast.   metoprolol succinate 25 MG 24 hr tablet Commonly known as: TOPROL-XL Take 0.5 tablets (12.5 mg total) by mouth daily with breakfast.   multivitamin with minerals Tabs tablet Take 1 tablet by mouth daily.   nystatin powder Generic drug: nystatin Apply 1 application. topically 3 (three) times daily.   ondansetron 4 MG disintegrating tablet Commonly known as: ZOFRAN-ODT Take 2 mg by mouth every 6 (six) hours as needed.   onetouch ultrasoft lancets 1 each by Other route daily. Use as instructed E11.29   senna-docusate 8.6-50 MG tablet Commonly known as: Senokot-S Take 2 tablets by mouth at bedtime.   Vitamin D (Ergocalciferol) 1.25 MG (50000 UNIT) Caps capsule Commonly known as: DRISDOL Take 50,000 Units by mouth every 7 (seven) days.               Discharge Care Instructions  (From admission, onward)           Start     Ordered   09/15/23 0000  Change dressing on IV access line weekly and PRN  (Home infusion instructions - [facility] )        09/15/23 0905   09/15/23 0000  Discharge wound care:       Comments: Cleanse bilateral lower legs (intact skin and areas of ulceration) with Vashe wound cleanser Hart [place] [phone]), do not rinse and allow to air dry. Apply Xeroform gauze Hart [place] [phone]) to anterior and posterior lower legs daily, cover with ABD pads and secure with Kerlix roll gauze  wrapped starting just above toes and ending right below knees.  May secure entire dressing with Ace bandage wrapped in same fashion as Kerlix roll gauze which will also provide some light compression.   09/15/23 0911            Contact information for after-discharge care     Destination     [facility]  .   Service: Skilled Paramedic information: [address] [place] [address] [phone]  Discharge Exam: Filed Weights   09/04/23 1948  Weight: 132.9 kg   General exam: awake, alert, no acute distress, obese Respiratory system: CTA, no wheezes, rales or rhonchi, normal respiratory effort. Cardiovascular system: normal S1/S2, RRR Gastrointestinal system: soft, NT, ND Central nervous system: A&O x 3. no gross focal neurologic deficits, normal speech Extremities: ace dressings on BLE's clean dry intact Psychiatry: normal mood, congruent affect   Condition at discharge: stable  The results of significant diagnostics from this hospitalization (including imaging, microbiology, ancillary and laboratory) are listed below for reference.   Imaging Studies: [name] EKG SITE RITE Result Date: 09/12/2023 If Site Rite image not attached, placement could not be confirmed due to current cardiac rhythm.  MR TIBIA FIBULA LEFT W WO CONTRAST Result Date: 09/09/2023 CLINICAL DATA:  Soft tissue infection suspected. Pain. Multiple ulcerations of the bilateral lower extremities. Bilateral extremities are bandaged currently but recent pictures of her lower extremities suggest the wounds do not appear infected but are likely related to chronic venous insufficiency. EXAM: MRI OF LOWER LEFT EXTREMITY WITHOUT AND WITH CONTRAST TECHNIQUE: Multiplanar, multisequence MR imaging of the left calf was performed both before and after administration of intravenous contrast. CONTRAST:  10mL GADAVIST GADOBUTROL 1 MMOL/ML IV SOLN COMPARISON:  CT  abdomen and pelvis 04/02/2022, bilateral lower extremity Doppler venous ultrasound 09/06/2023 FINDINGS: Bones/Joint/Cartilage There is moderate to high-grade cortical erosion within the lateral fibular diaphysis throughout the slightly proximal diaphysis through the distal diaphysis along an approximate 11 cm length in the region of the high-grade lateral calf ulcer. There is high-grade marrow edema and enhancement of the fibular diaphysis in these regions. Moderate periosteal edema and enhancement. This is indicative of acute osteomyelitis. Normal marrow signal and preserved cortex of the visualized tibia. Ligaments Edema tracking posterior to tibiofibular interosseous membrane. Muscles and Tendons There is edema tracking along the posterior aspect of the tibiofibular interosseous membrane posterior tibial musculature. Mild edema within the proximal medial aspect of the tibialis anterior muscle. Soft tissues There is concavity of the lateral left calf soft tissues with ulceration that extends to the lateral fibula at the slightly proximal aspect of the fibular diaphysis through the distal fibular diaphysis along an approximate 13 cm craniocaudal length (axial images 20 through 40). There is diffuse moderate skin thickening with waviness and irregularity throughout all portions of the calf. No walled-off abscess is seen. IMPRESSION: 1. High-grade lateral left calf soft tissue ulceration that extends to the lateral fibula at the slightly proximal aspect of the fibular diaphysis through the distal fibular diaphysis along an approximate 13 cm craniocaudal length. 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 2. Diffuse moderate skin thickening with skin waviness and irregularity throughout all portions of the calf. 3. No walled-off abscess is seen. Electronically Signed   By: Neita Garnet M.D.   On: 09/09/2023 10:24   US ARTERIAL LOWER EXTREMITY DUPLEX BILATERAL Result Date:  09/08/2023 CLINICAL DATA:  Bilateral lower extremity pain EXAM: BILATERAL LOWER EXTREMITY ARTERIAL DUPLEX SCAN TECHNIQUE: Gray-scale sonography as well as color Doppler and duplex ultrasound was performed to evaluate the arteries of both lower extremities including the common, superficial and profunda femoral arteries, popliteal artery and calf arteries. COMPARISON:  None Available. FINDINGS: Right Lower Extremity Inflow: Normal common femoral arterial waveforms and velocities. No evidence of inflow (aortoiliac) disease. Outflow: Normal profunda femoral, superficial femoral and popliteal arterial waveforms and velocities. No focal elevation of the PSV to suggest stenosis. Runoff: Normal posterior and anterior tibial  arterial waveforms and velocities. Vessels are patent to the ankle. Left Lower Extremity Inflow: Normal common femoral arterial waveforms and velocities. No evidence of inflow (aortoiliac) disease. Outflow: Normal profunda femoral, superficial femoral and popliteal arterial waveforms and velocities. No focal elevation of the PSV to suggest stenosis. Runoff: Normal posterior and anterior tibial arterial waveforms and velocities. Vessels are patent to the ankle. IMPRESSION: No evidence of hemodynamically significant arterial stenosis or occlusion in either lower extremity. Electronically Signed   By: Malachy Moan M.D.   On: 09/08/2023 15:32   DG Tibia/Fibula Left Port Result Date: 09/08/2023 CLINICAL DATA:  Left lower extremity ulcerations. Bilateral lower extremity pain. EXAM: PORTABLE LEFT TIBIA AND FIBULA - 2 VIEW COMPARISON:  None Available. FINDINGS: No definite fracture or dislocation is noted. Vascular calcifications are noted. No lytic destruction is noted. IMPRESSION: No acute abnormality seen involving the left tibia or fibula. Electronically Signed   By: Lupita Raider M.D.   On: 09/08/2023 12:47   US Venous Img Lower Bilateral (DVT) Result Date: 09/06/2023 CLINICAL DATA:  Bilateral  leg pain EXAM: BILATERAL LOWER EXTREMITY VENOUS DOPPLER ULTRASOUND TECHNIQUE: Gray-scale sonography with compression, as well as color and duplex ultrasound, were performed to evaluate the deep venous system(s) from the level of the common femoral vein through the popliteal and proximal calf veins. COMPARISON:  None Available. FINDINGS: VENOUS Normal compressibility of the common femoral, superficial femoral, and popliteal veins. The infrapopliteal venous vasculature is obscured by overlying bandaging. Visualized portions of profunda femoral vein and great saphenous vein unremarkable. No filling defects to suggest DVT on grayscale or color Doppler imaging. Doppler waveforms show normal direction of venous flow, normal respiratory plasticity and response to augmentation. Limited views of the contralateral common femoral vein are unremarkable. OTHER None. Limitations: none IMPRESSION: 1. No evidence of femoropopliteal DVT within either lower extremity. Electronically Signed   By: Helyn Numbers M.D.   On: 09/06/2023 20:05    Microbiology: Results for orders placed or performed during the hospital encounter of 09/04/23  Culture, blood (Routine X 2) w Reflex to ID Panel     Status: None   Collection Time: 09/04/23  7:35 PM   Specimen: BLOOD  Result Value Ref Range Status   Specimen Description BLOOD LEFT ANTECUBITAL  Final   Special Requests   Final    BOTTLES DRAWN AEROBIC AND ANAEROBIC Blood Culture results may not be optimal due to an inadequate volume of blood received in culture bottles   Culture   Final    NO GROWTH 5 DAYS Performed at [facility], [address] [place], [place] [address]    Report Status 09/09/2023 FINAL  Final  Culture, blood (Routine X 2) w Reflex to ID Panel     Status: None   Collection Time: 09/04/23  7:43 PM   Specimen: BLOOD  Result Value Ref Range Status   Specimen Description BLOOD BLOOD LEFT FOREARM  Final   Special Requests   Final    BOTTLES DRAWN  AEROBIC AND ANAEROBIC Blood Culture results may not be optimal due to an inadequate volume of blood received in culture bottles   Culture   Final    NO GROWTH 5 DAYS Performed at [facility], [address] [place], [place] [address]    Report Status 09/09/2023 FINAL  Final  Aerobic/Anaerobic Culture w Gram Stain (surgical/deep wound)     Status: None   Collection Time: 09/09/23  4:58 PM   Specimen: Wound  Result Value  Ref Range Status   Specimen Description   Final    WOUND Performed at [facility], [address] [place], [place] [address]    Special Requests   Final    LL Performed at [facility], [address] [place], [place] [address]    Gram Stain   Final    FEW WBC PRESENT, PREDOMINANTLY MONONUCLEAR FEW GRAM POSITIVE COCCI IN PAIRS FEW GRAM VARIABLE ROD FEW YEAST    Culture   Final    ABUNDANT CORYNEBACTERIUM STRIATUM MODERATE STAPHYLOCOCCUS AUREUS FEW PROVIDENCIA STUARTII NO ANAEROBES ISOLATED Performed at [facility], [address], [place], [place] [address]    Report Status 09/14/2023 FINAL  Final   Organism ID, Bacteria STAPHYLOCOCCUS AUREUS  Final   Organism ID, Bacteria PROVIDENCIA STUARTII  Final      Susceptibility   Providencia stuartii - MIC*    AMPICILLIN >=32 RESISTANT Resistant     CEFEPIME <=0.12 SENSITIVE Sensitive     CEFTAZIDIME 2 SENSITIVE Sensitive     CEFTRIAXONE 0.5 SENSITIVE Sensitive     CIPROFLOXACIN >=4 RESISTANT Resistant     GENTAMICIN RESISTANT Resistant     IMIPENEM 2 SENSITIVE Sensitive     TRIMETH/SULFA >=320 RESISTANT Resistant     AMPICILLIN/SULBACTAM >=32 RESISTANT Resistant     PIP/TAZO <=4 SENSITIVE Sensitive ug/mL    * FEW PROVIDENCIA STUARTII   Staphylococcus aureus - MIC*    CIPROFLOXACIN <=0.5 SENSITIVE Sensitive     ERYTHROMYCIN <=0.25 SENSITIVE Sensitive     GENTAMICIN <=0.5 SENSITIVE Sensitive     OXACILLIN 0.5 SENSITIVE Sensitive     TETRACYCLINE <=1 SENSITIVE  Sensitive     VANCOMYCIN 1 SENSITIVE Sensitive     TRIMETH/SULFA <=10 SENSITIVE Sensitive     CLINDAMYCIN <=0.25 SENSITIVE Sensitive     RIFAMPIN <=0.5 SENSITIVE Sensitive     Inducible Clindamycin NEGATIVE Sensitive     LINEZOLID 2 SENSITIVE Sensitive     * MODERATE STAPHYLOCOCCUS AUREUS    Labs: CBC: Recent Labs  Lab 09/09/23 0217 09/12/23 0403  WBC 9.8 8.2  HGB 10.1* 9.7*  HCT 32.9* 31.5*  MCV 86.1 86.3  PLT 626* 494*   Basic Metabolic Panel: Recent Labs  Lab 09/09/23 0217 09/12/23 0403 09/14/23 0545  NA 134* 134*  --   K 4.3 4.3  --   CL 101 103  --   CO2 23 24  --   GLUCOSE 234* 273*  --   BUN 37* 30*  --   CREATININE 1.66* 1.41* 1.05*  CALCIUM 8.7* 8.8*  --    Liver Function Tests: No results for input(s): "AST", "ALT", "ALKPHOS", "BILITOT", "PROT", "ALBUMIN" in the last 168 hours. CBG: Recent Labs  Lab 09/14/23 0835 09/14/23 1201 09/14/23 1635 09/14/23 2149 09/15/23 0800  GLUCAP 144* 112* 103* 103* 109*    Discharge time spent: greater than 30 minutes.  Signed: Pennie Banter, DO [facility] 09/15/2023

## 2023-09-18 DIAGNOSIS — Z95828 Presence of other vascular implants and grafts: Secondary | ICD-10-CM | POA: Diagnosis not present

## 2023-09-18 DIAGNOSIS — T148XXA Other injury of unspecified body region, initial encounter: Secondary | ICD-10-CM | POA: Diagnosis not present

## 2023-09-18 DIAGNOSIS — G8929 Other chronic pain: Secondary | ICD-10-CM | POA: Diagnosis not present

## 2023-09-18 DIAGNOSIS — M869 Osteomyelitis, unspecified: Secondary | ICD-10-CM | POA: Diagnosis not present

## 2023-09-18 LAB — LAB REPORT - SCANNED: EGFR: 58

## 2023-09-19 DIAGNOSIS — M869 Osteomyelitis, unspecified: Secondary | ICD-10-CM | POA: Diagnosis not present

## 2023-09-19 DIAGNOSIS — I10 Essential (primary) hypertension: Secondary | ICD-10-CM | POA: Diagnosis not present

## 2023-09-19 DIAGNOSIS — T148XXA Other injury of unspecified body region, initial encounter: Secondary | ICD-10-CM | POA: Diagnosis not present

## 2023-09-20 DIAGNOSIS — D509 Iron deficiency anemia, unspecified: Secondary | ICD-10-CM | POA: Diagnosis not present

## 2023-09-20 DIAGNOSIS — G8929 Other chronic pain: Secondary | ICD-10-CM | POA: Diagnosis not present

## 2023-09-20 DIAGNOSIS — T148XXA Other injury of unspecified body region, initial encounter: Secondary | ICD-10-CM | POA: Diagnosis not present

## 2023-09-20 DIAGNOSIS — I509 Heart failure, unspecified: Secondary | ICD-10-CM | POA: Diagnosis not present

## 2023-09-20 DIAGNOSIS — E559 Vitamin D deficiency, unspecified: Secondary | ICD-10-CM | POA: Diagnosis not present

## 2023-09-20 DIAGNOSIS — L299 Pruritus, unspecified: Secondary | ICD-10-CM | POA: Diagnosis not present

## 2023-09-20 DIAGNOSIS — I10 Essential (primary) hypertension: Secondary | ICD-10-CM | POA: Diagnosis not present

## 2023-09-20 DIAGNOSIS — M869 Osteomyelitis, unspecified: Secondary | ICD-10-CM | POA: Diagnosis not present

## 2023-09-24 ENCOUNTER — Other Ambulatory Visit: Payer: Self-pay

## 2023-09-24 ENCOUNTER — Inpatient Hospital Stay
Admission: EM | Admit: 2023-09-24 | Discharge: 2023-10-02 | DRG: 871 | Disposition: A | Discharge status: Skilled Nursing Facility | Source: Skilled Nursing Facility | Attending: Internal Medicine | Admitting: Internal Medicine

## 2023-09-24 ENCOUNTER — Emergency Department

## 2023-09-24 DIAGNOSIS — I10 Essential (primary) hypertension: Secondary | ICD-10-CM | POA: Diagnosis not present

## 2023-09-24 DIAGNOSIS — L97919 Non-pressure chronic ulcer of unspecified part of right lower leg with unspecified severity: Secondary | ICD-10-CM | POA: Diagnosis not present

## 2023-09-24 DIAGNOSIS — N189 Chronic kidney disease, unspecified: Secondary | ICD-10-CM | POA: Diagnosis present

## 2023-09-24 DIAGNOSIS — R569 Unspecified convulsions: Secondary | ICD-10-CM | POA: Diagnosis not present

## 2023-09-24 DIAGNOSIS — Z95828 Presence of other vascular implants and grafts: Secondary | ICD-10-CM

## 2023-09-24 DIAGNOSIS — E876 Hypokalemia: Secondary | ICD-10-CM | POA: Diagnosis not present

## 2023-09-24 DIAGNOSIS — K8020 Calculus of gallbladder without cholecystitis without obstruction: Secondary | ICD-10-CM | POA: Diagnosis not present

## 2023-09-24 DIAGNOSIS — R0689 Other abnormalities of breathing: Secondary | ICD-10-CM | POA: Diagnosis not present

## 2023-09-24 DIAGNOSIS — D631 Anemia in chronic kidney disease: Secondary | ICD-10-CM | POA: Diagnosis not present

## 2023-09-24 DIAGNOSIS — R059 Cough, unspecified: Secondary | ICD-10-CM | POA: Diagnosis not present

## 2023-09-24 DIAGNOSIS — Z833 Family history of diabetes mellitus: Secondary | ICD-10-CM

## 2023-09-24 DIAGNOSIS — M8618 Other acute osteomyelitis, other site: Secondary | ICD-10-CM | POA: Diagnosis not present

## 2023-09-24 DIAGNOSIS — I509 Heart failure, unspecified: Secondary | ICD-10-CM

## 2023-09-24 DIAGNOSIS — Z96641 Presence of right artificial hip joint: Secondary | ICD-10-CM | POA: Diagnosis present

## 2023-09-24 DIAGNOSIS — N1832 Chronic kidney disease, stage 3b: Secondary | ICD-10-CM | POA: Diagnosis present

## 2023-09-24 DIAGNOSIS — I6782 Cerebral ischemia: Secondary | ICD-10-CM | POA: Diagnosis not present

## 2023-09-24 DIAGNOSIS — E039 Hypothyroidism, unspecified: Secondary | ICD-10-CM | POA: Diagnosis not present

## 2023-09-24 DIAGNOSIS — L97929 Non-pressure chronic ulcer of unspecified part of left lower leg with unspecified severity: Secondary | ICD-10-CM | POA: Diagnosis not present

## 2023-09-24 DIAGNOSIS — R4182 Altered mental status, unspecified: Secondary | ICD-10-CM | POA: Diagnosis not present

## 2023-09-24 DIAGNOSIS — E8721 Acute metabolic acidosis: Secondary | ICD-10-CM | POA: Diagnosis not present

## 2023-09-24 DIAGNOSIS — R6520 Severe sepsis without septic shock: Secondary | ICD-10-CM | POA: Diagnosis not present

## 2023-09-24 DIAGNOSIS — L89152 Pressure ulcer of sacral region, stage 2: Secondary | ICD-10-CM | POA: Diagnosis not present

## 2023-09-24 DIAGNOSIS — G8929 Other chronic pain: Secondary | ICD-10-CM | POA: Diagnosis present

## 2023-09-24 DIAGNOSIS — J9601 Acute respiratory failure with hypoxia: Secondary | ICD-10-CM | POA: Diagnosis present

## 2023-09-24 DIAGNOSIS — E1122 Type 2 diabetes mellitus with diabetic chronic kidney disease: Secondary | ICD-10-CM | POA: Diagnosis present

## 2023-09-24 DIAGNOSIS — L8990 Pressure ulcer of unspecified site, unspecified stage: Secondary | ICD-10-CM | POA: Diagnosis present

## 2023-09-24 DIAGNOSIS — T40601A Poisoning by unspecified narcotics, accidental (unintentional), initial encounter: Secondary | ICD-10-CM | POA: Diagnosis not present

## 2023-09-24 DIAGNOSIS — E7220 Disorder of urea cycle metabolism, unspecified: Secondary | ICD-10-CM | POA: Diagnosis not present

## 2023-09-24 DIAGNOSIS — Z66 Do not resuscitate: Secondary | ICD-10-CM | POA: Diagnosis not present

## 2023-09-24 DIAGNOSIS — K8689 Other specified diseases of pancreas: Secondary | ICD-10-CM | POA: Diagnosis not present

## 2023-09-24 DIAGNOSIS — Z79891 Long term (current) use of opiate analgesic: Secondary | ICD-10-CM

## 2023-09-24 DIAGNOSIS — G9340 Encephalopathy, unspecified: Secondary | ICD-10-CM

## 2023-09-24 DIAGNOSIS — A419 Sepsis, unspecified organism: Principal | ICD-10-CM | POA: Diagnosis present

## 2023-09-24 DIAGNOSIS — Z4659 Encounter for fitting and adjustment of other gastrointestinal appliance and device: Secondary | ICD-10-CM | POA: Diagnosis not present

## 2023-09-24 DIAGNOSIS — I739 Peripheral vascular disease, unspecified: Secondary | ICD-10-CM | POA: Diagnosis present

## 2023-09-24 DIAGNOSIS — Z515 Encounter for palliative care: Secondary | ICD-10-CM

## 2023-09-24 DIAGNOSIS — I130 Hypertensive heart and chronic kidney disease with heart failure and stage 1 through stage 4 chronic kidney disease, or unspecified chronic kidney disease: Secondary | ICD-10-CM | POA: Diagnosis present

## 2023-09-24 DIAGNOSIS — M86162 Other acute osteomyelitis, left tibia and fibula: Secondary | ICD-10-CM

## 2023-09-24 DIAGNOSIS — Z89421 Acquired absence of other right toe(s): Secondary | ICD-10-CM

## 2023-09-24 DIAGNOSIS — G9341 Metabolic encephalopathy: Secondary | ICD-10-CM | POA: Diagnosis not present

## 2023-09-24 DIAGNOSIS — Z87442 Personal history of urinary calculi: Secondary | ICD-10-CM

## 2023-09-24 DIAGNOSIS — Z6841 Body Mass Index (BMI) 40.0 and over, adult: Secondary | ICD-10-CM

## 2023-09-24 DIAGNOSIS — R0989 Other specified symptoms and signs involving the circulatory and respiratory systems: Secondary | ICD-10-CM | POA: Diagnosis not present

## 2023-09-24 DIAGNOSIS — G928 Other toxic encephalopathy: Secondary | ICD-10-CM | POA: Diagnosis not present

## 2023-09-24 DIAGNOSIS — Z452 Encounter for adjustment and management of vascular access device: Secondary | ICD-10-CM | POA: Diagnosis not present

## 2023-09-24 DIAGNOSIS — N170 Acute kidney failure with tubular necrosis: Secondary | ICD-10-CM | POA: Diagnosis present

## 2023-09-24 DIAGNOSIS — Z743 Need for continuous supervision: Secondary | ICD-10-CM | POA: Diagnosis not present

## 2023-09-24 DIAGNOSIS — Z1152 Encounter for screening for COVID-19: Secondary | ICD-10-CM

## 2023-09-24 DIAGNOSIS — G253 Myoclonus: Secondary | ICD-10-CM | POA: Diagnosis present

## 2023-09-24 DIAGNOSIS — N1831 Chronic kidney disease, stage 3a: Secondary | ICD-10-CM | POA: Diagnosis not present

## 2023-09-24 DIAGNOSIS — I83029 Varicose veins of left lower extremity with ulcer of unspecified site: Secondary | ICD-10-CM | POA: Diagnosis not present

## 2023-09-24 DIAGNOSIS — E1129 Type 2 diabetes mellitus with other diabetic kidney complication: Secondary | ICD-10-CM | POA: Diagnosis present

## 2023-09-24 DIAGNOSIS — I5022 Chronic systolic (congestive) heart failure: Secondary | ICD-10-CM | POA: Diagnosis present

## 2023-09-24 DIAGNOSIS — E1151 Type 2 diabetes mellitus with diabetic peripheral angiopathy without gangrene: Secondary | ICD-10-CM | POA: Diagnosis present

## 2023-09-24 DIAGNOSIS — Z9682 Presence of neurostimulator: Secondary | ICD-10-CM

## 2023-09-24 DIAGNOSIS — R609 Edema, unspecified: Secondary | ICD-10-CM | POA: Diagnosis not present

## 2023-09-24 DIAGNOSIS — Z794 Long term (current) use of insulin: Secondary | ICD-10-CM | POA: Diagnosis not present

## 2023-09-24 DIAGNOSIS — T361X5A Adverse effect of cephalosporins and other beta-lactam antibiotics, initial encounter: Secondary | ICD-10-CM | POA: Diagnosis present

## 2023-09-24 DIAGNOSIS — L089 Local infection of the skin and subcutaneous tissue, unspecified: Secondary | ICD-10-CM | POA: Diagnosis present

## 2023-09-24 DIAGNOSIS — N179 Acute kidney failure, unspecified: Secondary | ICD-10-CM | POA: Diagnosis not present

## 2023-09-24 DIAGNOSIS — E1169 Type 2 diabetes mellitus with other specified complication: Secondary | ICD-10-CM | POA: Diagnosis present

## 2023-09-24 DIAGNOSIS — A4102 Sepsis due to Methicillin resistant Staphylococcus aureus: Principal | ICD-10-CM | POA: Diagnosis present

## 2023-09-24 DIAGNOSIS — E871 Hypo-osmolality and hyponatremia: Secondary | ICD-10-CM | POA: Diagnosis present

## 2023-09-24 DIAGNOSIS — R140 Abdominal distension (gaseous): Secondary | ICD-10-CM | POA: Diagnosis not present

## 2023-09-24 DIAGNOSIS — Z604 Social exclusion and rejection: Secondary | ICD-10-CM | POA: Diagnosis present

## 2023-09-24 DIAGNOSIS — Z79890 Hormone replacement therapy (postmenopausal): Secondary | ICD-10-CM

## 2023-09-24 DIAGNOSIS — J9811 Atelectasis: Secondary | ICD-10-CM | POA: Diagnosis not present

## 2023-09-24 DIAGNOSIS — Z79899 Other long term (current) drug therapy: Secondary | ICD-10-CM

## 2023-09-24 DIAGNOSIS — Z91199 Patient's noncompliance with other medical treatment and regimen due to unspecified reason: Secondary | ICD-10-CM

## 2023-09-24 DIAGNOSIS — I83019 Varicose veins of right lower extremity with ulcer of unspecified site: Secondary | ICD-10-CM | POA: Diagnosis not present

## 2023-09-24 DIAGNOSIS — Z7401 Bed confinement status: Secondary | ICD-10-CM

## 2023-09-24 DIAGNOSIS — I129 Hypertensive chronic kidney disease with stage 1 through stage 4 chronic kidney disease, or unspecified chronic kidney disease: Secondary | ICD-10-CM | POA: Diagnosis not present

## 2023-09-24 DIAGNOSIS — E875 Hyperkalemia: Secondary | ICD-10-CM | POA: Diagnosis present

## 2023-09-24 DIAGNOSIS — Z4682 Encounter for fitting and adjustment of non-vascular catheter: Secondary | ICD-10-CM | POA: Diagnosis not present

## 2023-09-24 DIAGNOSIS — K5730 Diverticulosis of large intestine without perforation or abscess without bleeding: Secondary | ICD-10-CM | POA: Diagnosis not present

## 2023-09-24 DIAGNOSIS — Z7902 Long term (current) use of antithrombotics/antiplatelets: Secondary | ICD-10-CM

## 2023-09-24 DIAGNOSIS — I480 Paroxysmal atrial fibrillation: Secondary | ICD-10-CM | POA: Diagnosis not present

## 2023-09-24 DIAGNOSIS — I959 Hypotension, unspecified: Secondary | ICD-10-CM | POA: Diagnosis not present

## 2023-09-24 DIAGNOSIS — M869 Osteomyelitis, unspecified: Secondary | ICD-10-CM | POA: Diagnosis present

## 2023-09-24 LAB — COMPREHENSIVE METABOLIC PANEL WITH GFR
ALT: 14 U/L (ref 0–44)
AST: 23 U/L (ref 15–41)
Albumin: 2.5 g/dL — ABNORMAL LOW (ref 3.5–5.0)
Alkaline Phosphatase: 76 U/L (ref 38–126)
Anion gap: 11 (ref 5–15)
BUN: 38 mg/dL — ABNORMAL HIGH (ref 8–23)
CO2: 21 mmol/L — ABNORMAL LOW (ref 22–32)
Calcium: 8.8 mg/dL — ABNORMAL LOW (ref 8.9–10.3)
Chloride: 108 mmol/L (ref 98–111)
Creatinine, Ser: 2.75 mg/dL — ABNORMAL HIGH (ref 0.44–1.00)
GFR, Estimated: 18 mL/min — ABNORMAL LOW (ref >60)
Glucose, Bld: 155 mg/dL — ABNORMAL HIGH (ref 70–99)
Potassium: 5.1 mmol/L (ref 3.5–5.1)
Sodium: 140 mmol/L (ref 135–145)
Total Bilirubin: 0.6 mg/dL (ref 0.0–1.2)
Total Protein: 7.4 g/dL (ref 6.5–8.1)

## 2023-09-24 LAB — URINALYSIS, W/ REFLEX TO CULTURE (INFECTION SUSPECTED)
Bilirubin Urine: NEGATIVE
Glucose, UA: NEGATIVE mg/dL
Ketones, ur: 5 mg/dL — AB
Leukocytes,Ua: NEGATIVE
Nitrite: NEGATIVE
Protein, ur: 100 mg/dL — AB
Specific Gravity, Urine: 1.023 (ref 1.005–1.030)
pH: 5 (ref 5.0–8.0)

## 2023-09-24 LAB — LACTIC ACID, PLASMA
Lactic Acid, Venous: 2.7 mmol/L (ref 0.5–1.9)
Lactic Acid, Venous: 2.2 mmol/L (ref 0.5–1.9)

## 2023-09-24 LAB — CBC WITH DIFFERENTIAL/PLATELET
Abs Immature Granulocytes: 0.04 10*3/uL (ref 0.00–0.07)
Basophils Absolute: 0.1 10*3/uL (ref 0.0–0.1)
Basophils Relative: 1 %
Eosinophils Absolute: 1.1 10*3/uL — ABNORMAL HIGH (ref 0.0–0.5)
Eosinophils Relative: 11 %
HCT: 36.1 % (ref 36.0–46.0)
Hemoglobin: 10.9 g/dL — ABNORMAL LOW (ref 12.0–15.0)
Immature Granulocytes: 0 %
Lymphocytes Relative: 17 %
Lymphs Abs: 1.6 10*3/uL (ref 0.7–4.0)
MCH: 27.3 pg (ref 26.0–34.0)
MCHC: 30.2 g/dL (ref 30.0–36.0)
MCV: 90.3 fL (ref 80.0–100.0)
Monocytes Absolute: 0.6 10*3/uL (ref 0.1–1.0)
Monocytes Relative: 6 %
Neutro Abs: 6.3 10*3/uL (ref 1.7–7.7)
Neutrophils Relative %: 65 %
Platelets: 415 10*3/uL — ABNORMAL HIGH (ref 150–400)
RBC: 4 MIL/uL (ref 3.87–5.11)
RDW: 19.3 % — ABNORMAL HIGH (ref 11.5–15.5)
WBC: 9.8 10*3/uL (ref 4.0–10.5)
nRBC: 0 % (ref 0.0–0.2)

## 2023-09-24 LAB — URINE DRUG SCREEN, QUALITATIVE (ARMC ONLY)
Amphetamines, Ur Screen: NOT DETECTED
Barbiturates, Ur Screen: NOT DETECTED
Benzodiazepine, Ur Scrn: NOT DETECTED
Cannabinoid 50 Ng, Ur ~~LOC~~: NOT DETECTED
Cocaine Metabolite,Ur ~~LOC~~: NOT DETECTED
MDMA (Ecstasy)Ur Screen: NOT DETECTED
Methadone Scn, Ur: NOT DETECTED
Opiate, Ur Screen: POSITIVE — AB
Phencyclidine (PCP) Ur S: NOT DETECTED
Tricyclic, Ur Screen: NOT DETECTED

## 2023-09-24 LAB — CBG MONITORING, ED: Glucose-Capillary: 187 mg/dL — ABNORMAL HIGH (ref 70–99)

## 2023-09-24 LAB — APTT: aPTT: 31 s (ref 24–36)

## 2023-09-24 LAB — PROTIME-INR
INR: 1.4 — ABNORMAL HIGH (ref 0.8–1.2)
Prothrombin Time: 17.3 s — ABNORMAL HIGH (ref 11.4–15.2)

## 2023-09-24 LAB — SEDIMENTATION RATE: Sed Rate: 84 mm/h — ABNORMAL HIGH (ref 0–30)

## 2023-09-24 LAB — RESP PANEL BY RT-PCR (RSV, FLU A&B, COVID)  RVPGX2
Influenza A by PCR: NEGATIVE
Influenza B by PCR: NEGATIVE
Resp Syncytial Virus by PCR: NEGATIVE
SARS Coronavirus 2 by RT PCR: NEGATIVE

## 2023-09-24 LAB — C-REACTIVE PROTEIN: CRP: 6.6 mg/dL — ABNORMAL HIGH (ref <1.0)

## 2023-09-24 LAB — BLOOD GAS, VENOUS

## 2023-09-24 MED ORDER — FENTANYL CITRATE PF 50 MCG/ML IJ SOSY
50.0000 ug | PREFILLED_SYRINGE | Freq: Once | INTRAMUSCULAR | Status: AC
  Administered 2023-09-24: 50 ug via INTRAVENOUS
  Filled 2023-09-24: qty 1

## 2023-09-24 MED ORDER — HYDROMORPHONE HCL 1 MG/ML IJ SOLN
0.5000 mg | INTRAMUSCULAR | Status: DC | PRN
  Administered 2023-09-24: 1 mg via INTRAVENOUS
  Administered 2023-09-24: 0.5 mg via INTRAVENOUS
  Administered 2023-09-25: 1 mg via INTRAVENOUS
  Administered 2023-09-25 (×2): 0.5 mg via INTRAVENOUS
  Filled 2023-09-24 (×5): qty 1

## 2023-09-24 MED ORDER — SODIUM CHLORIDE 0.9% FLUSH
3.0000 mL | Freq: Two times a day (BID) | INTRAVENOUS | Status: DC
  Administered 2023-09-24 – 2023-10-02 (×16): 3 mL via INTRAVENOUS

## 2023-09-24 MED ORDER — DAPTOMYCIN-SODIUM CHLORIDE 700-0.9 MG/100ML-% IV SOLN: 700.0000 mg | INTRAVENOUS | Status: DC

## 2023-09-24 MED ORDER — LACTATED RINGERS IV BOLUS (SEPSIS)
1000.0000 mL | Freq: Once | INTRAVENOUS | Status: AC
  Administered 2023-09-24: 1000 mL via INTRAVENOUS

## 2023-09-24 MED ORDER — VANCOMYCIN HCL IN DEXTROSE 1-5 GM/200ML-% IV SOLN
1000.0000 mg | Freq: Once | INTRAVENOUS | Status: AC
  Administered 2023-09-24: 1000 mg via INTRAVENOUS
  Filled 2023-09-24: qty 200

## 2023-09-24 MED ORDER — ACETAMINOPHEN 10 MG/ML IV SOLN: 1000.0000 mg | Freq: Four times a day (QID) | INTRAVENOUS | Status: AC | PRN

## 2023-09-24 MED ORDER — INSULIN ASPART 100 UNIT/ML IJ SOLN
0.0000 [IU] | Freq: Three times a day (TID) | INTRAMUSCULAR | Status: DC
  Administered 2023-09-24 – 2023-09-25 (×2): 3 [IU] via SUBCUTANEOUS
  Administered 2023-09-25: 5 [IU] via SUBCUTANEOUS
  Administered 2023-09-25: 8 [IU] via SUBCUTANEOUS
  Administered 2023-09-26: 3 [IU] via SUBCUTANEOUS
  Administered 2023-09-26: 2 [IU] via SUBCUTANEOUS
  Administered 2023-09-27: 3 [IU] via SUBCUTANEOUS
  Administered 2023-09-28 (×2): 2 [IU] via SUBCUTANEOUS
  Administered 2023-09-29: 3 [IU] via SUBCUTANEOUS
  Administered 2023-09-29 – 2023-09-30 (×3): 2 [IU] via SUBCUTANEOUS
  Filled 2023-09-24 (×13): qty 1

## 2023-09-24 MED ORDER — SODIUM CHLORIDE 0.9 % IV SOLN
2.0000 g | INTRAVENOUS | Status: DC
  Administered 2023-09-25: 2 g via INTRAVENOUS
  Filled 2023-09-24: qty 12.5

## 2023-09-24 MED ORDER — LACTATED RINGERS IV SOLN: INTRAVENOUS | Status: AC

## 2023-09-24 MED ORDER — DAPTOMYCIN-SODIUM CHLORIDE 700-0.9 MG/100ML-% IV SOLN: 700.0000 mg | Freq: Every day | INTRAVENOUS | Status: DC

## 2023-09-24 MED ORDER — ONDANSETRON HCL 4 MG/2ML IJ SOLN
4.0000 mg | Freq: Four times a day (QID) | INTRAMUSCULAR | Status: DC | PRN
  Administered 2023-09-24 – 2023-09-25 (×2): 4 mg via INTRAVENOUS
  Filled 2023-09-24 (×2): qty 2

## 2023-09-24 MED ORDER — SODIUM CHLORIDE 0.9 % IV SOLN
2.0000 g | Freq: Once | INTRAVENOUS | Status: AC
  Administered 2023-09-24: 2 g via INTRAVENOUS
  Filled 2023-09-24: qty 20

## 2023-09-24 NOTE — ED Provider Notes (Signed)
 [facility] Provider Note   Event Date/Time   First MD Initiated Contact with Patient 09/24/23 1205     (approximate) History  Altered Mental Status  HPI [name] is a 68 y.o. 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  Patient is normally alert and oriented x 3 however over the past 24-48 hours she has not been taking her oral medications.  Patient is only moaning and intermittently screaming in the emergency department.  Patient is not answering any questions at this time and therefore further review of systems are unable to be obtained ROS: Unable to assess   Physical Exam  Triage Vital Signs: ED Triage Vitals  Encounter Vitals Group     BP 09/24/23 1209 (!) 117/104     Systolic BP Percentile --      Diastolic BP Percentile --      Pulse Rate 09/24/23 1209 (!) 110     Resp --      Temp 09/24/23 1209 98.3 F (36.8 C)     Temp Source 09/24/23 1209 Oral     SpO2 09/24/23 1209 100 %     Weight 09/24/23 1211 293 lb 4.8 oz (133 kg)     Height 09/24/23 1211 5\' 5"  (1.651 m)     Head Circumference --      Peak Flow --      Pain Score --      Pain Loc --      Pain Education --      Exclude from Growth Chart --    Most recent vital signs: Vitals:   09/25/23 2033 09/25/23 2251  BP:  138/66  Pulse:  (!) 132  Resp:  15  Temp: 97.7 F (36.5 C) (P) 98.5 F (36.9 C)  SpO2:  98%   General: Awake, moaning, screaming, crying CV:  Good peripheral perfusion.  Resp:  Normal effort.  Abd:  No distention.  Increased screaming with palpation of the abdomen Other:  Elderly morbidly obese Caucasian female laying in stretcher in acute distress due to unknown cause ED Results / Procedures / Treatments  Labs (all labs ordered are listed, but only abnormal results are  displayed) Labs Reviewed  LACTIC ACID, PLASMA - Abnormal; Notable for the following components:      Result Value   Lactic Acid, Venous 2.7 (*)    All other components within normal limits  LACTIC ACID, PLASMA - Abnormal; Notable for the following components:   Lactic Acid, Venous 2.2 (*)    All other components within normal limits  COMPREHENSIVE METABOLIC PANEL WITH GFR - Abnormal; Notable for the following components:   CO2 21 (*)    Glucose, Bld 155 (*)    BUN 38 (*)    Creatinine, Ser 2.75 (*)    Calcium 8.8 (*)    Albumin 2.5 (*)    GFR, Estimated 18 (*)    All other components within normal limits  CBC WITH DIFFERENTIAL/PLATELET - Abnormal; Notable for the following components:   Hemoglobin 10.9 (*)    RDW 19.3 (*)    Platelets 415 (*)    Eosinophils Absolute 1.1 (*)    All other components within normal limits  PROTIME-INR - Abnormal; Notable for the following components:   Prothrombin Time 17.3 (*)    INR 1.4 (*)  All other components within normal limits  BLOOD GAS, VENOUS - Abnormal; Notable for the following components:   pH, Ven 7.21 (*)    pO2, Ven 53 (*)    Bicarbonate 18.4 (*)    Acid-base deficit 9.4 (*)    All other components within normal limits  URINALYSIS, W/ REFLEX TO CULTURE (INFECTION SUSPECTED) - Abnormal; Notable for the following components:   Color, Urine YELLOW (*)    APPearance CLOUDY (*)    Hgb urine dipstick SMALL (*)    Ketones, ur 5 (*)    Protein, ur 100 (*)    Bacteria, UA FEW (*)    All other components within normal limits  URINE DRUG SCREEN, QUALITATIVE ([facility] ONLY) - Abnormal; Notable for the following components:   Opiate, Ur Screen POSITIVE (*)    All other components within normal limits  SEDIMENTATION RATE - Abnormal; Notable for the following components:   Sed Rate 84 (*)    All other components within normal limits  C-REACTIVE PROTEIN - Abnormal; Notable for the following components:   CRP 6.6 (*)    All other  components within normal limits  COMPREHENSIVE METABOLIC PANEL WITH GFR - Abnormal; Notable for the following components:   Potassium 5.8 (*)    CO2 19 (*)    Glucose, Bld 212 (*)    BUN 42 (*)    Creatinine, Ser 2.85 (*)    Calcium 8.7 (*)    Albumin 2.2 (*)    GFR, Estimated 17 (*)    All other components within normal limits  CBC WITH DIFFERENTIAL/PLATELET - Abnormal; Notable for the following components:   WBC 17.6 (*)    RBC 3.63 (*)    Hemoglobin 10.1 (*)    HCT 33.4 (*)    RDW 19.3 (*)    Neutro Abs 16.1 (*)    Lymphs Abs 0.4 (*)    Abs Immature Granulocytes 0.12 (*)    All other components within normal limits  CK - Abnormal; Notable for the following components:   Total CK 36 (*)    All other components within normal limits  BLOOD GAS, ARTERIAL - Abnormal; Notable for the following components:   pCO2 arterial 30 (*)    Bicarbonate 16.9 (*)    Acid-base deficit 7.3 (*)    All other components within normal limits  LACTIC ACID, PLASMA - Abnormal; Notable for the following components:   Lactic Acid, Venous 3.6 (*)    All other components within normal limits  LACTIC ACID, PLASMA - Abnormal; Notable for the following components:   Lactic Acid, Venous 3.4 (*)    All other components within normal limits  BETA-HYDROXYBUTYRIC ACID - Abnormal; Notable for the following components:   Beta-Hydroxybutyric Acid 0.74 (*)    All other components within normal limits  BASIC METABOLIC PANEL WITH GFR - Abnormal; Notable for the following components:   CO2 17 (*)    Glucose, Bld 264 (*)    BUN 45 (*)    Creatinine, Ser 3.23 (*)    Calcium 8.7 (*)    GFR, Estimated 15 (*)    Anion gap 16 (*)    All other components within normal limits  PHOSPHORUS - Abnormal; Notable for the following components:   Phosphorus 5.9 (*)    All other components within normal limits  GLUCOSE, CAPILLARY - Abnormal; Notable for the following components:   Glucose-Capillary 199 (*)    All other  components within normal limits  CBG MONITORING,  ED - Abnormal; Notable for the following components:   Glucose-Capillary 187 (*)    All other components within normal limits  CBG MONITORING, ED - Abnormal; Notable for the following components:   Glucose-Capillary 195 (*)    All other components within normal limits  CBG MONITORING, ED - Abnormal; Notable for the following components:   Glucose-Capillary 266 (*)    All other components within normal limits  CBG MONITORING, ED - Abnormal; Notable for the following components:   Glucose-Capillary 259 (*)    All other components within normal limits  CBG MONITORING, ED - Abnormal; Notable for the following components:   Glucose-Capillary 247 (*)    All other components within normal limits  RESP PANEL BY RT-PCR (RSV, FLU A&B, COVID)  RVPGX2  CULTURE, BLOOD (ROUTINE X 2)  CULTURE, BLOOD (ROUTINE X 2)  URINE CULTURE  MRSA NEXT GEN BY PCR, NASAL  APTT  MAGNESIUM  BASIC METABOLIC PANEL WITH GFR  CBC  MAGNESIUM  PHOSPHORUS  CBC WITH DIFFERENTIAL/PLATELET  COMPREHENSIVE METABOLIC PANEL WITH GFR  LACTIC ACID, PLASMA  LACTIC ACID, PLASMA  PROTIME-INR  APTT  TSH  BRAIN NATRIURETIC PEPTIDE  TROPONIN I (HIGH SENSITIVITY)   EKG ED ECG REPORT I, Merwyn Katos, the attending physician, personally viewed and interpreted this ECG. Date: 09/24/2023 EKG Time: 1213 Rate: 115 Rhythm: Tachycardic sinus rhythm QRS Axis: normal Intervals: normal ST/T Wave abnormalities: normal Narrative Interpretation: Tachycardic sinus rhythm.  No evidence of acute ischemia RADIOLOGY ED MD interpretation: CT of the head without contrast interpreted by me shows no evidence of acute abnormalities including no intracerebral hemorrhage, obvious masses, or significant edema One-view chest x-ray interpreted by me shows no evidence of acute abnormalities including no pneumonia, pneumothorax, or widened mediastinum -Agree with radiology assessment Official  radiology report(s): No results found. PROCEDURES: Critical Care performed: Yes, see critical care procedure note(s) Procedures CRITICAL CARE Performed by: Merwyn Katos  Total critical care time: 45 minutes  Critical care time was exclusive of separately billable procedures and treating other patients.  Critical care was necessary to treat or prevent imminent or life-threatening deterioration.  Critical care was time spent personally by me on the following activities: development of treatment plan with patient and/or surrogate as well as nursing, discussions with consultants, evaluation of patient's response to treatment, examination of patient, obtaining history from patient or surrogate, ordering and performing treatments and interventions, ordering and review of laboratory studies, ordering and review of radiographic studies, pulse oximetry and re-evaluation of patient's condition.  MEDICATIONS ORDERED IN ED: Medications  lactated ringers infusion (0 mLs Intravenous Stopped 09/25/23 0822)  sodium chloride flush (NS) 0.9 % injection 3 mL (3 mLs Intravenous Given 09/25/23 2045)  acetaminophen (OFIRMEV) IV 1,000 mg (has no administration in time range)  ondansetron (ZOFRAN) injection 4 mg (4 mg Intravenous Given 09/25/23 0245)  insulin aspart (novoLOG) injection 0-15 Units (5 Units Subcutaneous Given 09/25/23 1649)  heparin injection 5,000 Units (5,000 Units Subcutaneous Given 09/25/23 2119)  levothyroxine (SYNTHROID) tablet 200 mcg (has no administration in time range)  insulin glargine-yfgn (SEMGLEE) injection 15 Units (15 Units Subcutaneous Given 09/25/23 2119)  lactated ringers infusion ( Intravenous New Bag/Given 09/25/23 1932)  acetaminophen (TYLENOL) tablet 650 mg (has no administration in time range)  HYDROmorphone (DILAUDID) injection 0.5-1 mg (has no administration in time range)  naloxone [facility]) injection 0.4 mg (has no administration in time range)  sodium zirconium  cyclosilicate (LOKELMA) packet 10 g (10 g Oral Not Given 09/25/23 1521)  fentaNYL (SUBLIMAZE) injection 50 mcg (50 mcg Intravenous Given 09/24/23 1226)  lactated ringers bolus 1,000 mL (0 mLs Intravenous Stopped 09/24/23 1822)  cefTRIAXone (ROCEPHIN) 2 g in sodium chloride 0.9 % 100 mL IVPB (0 g Intravenous Stopped 09/24/23 1316)  vancomycin (VANCOCIN) IVPB 1000 mg/200 mL premix (0 mg Intravenous Stopped 09/24/23 1421)  fentaNYL (SUBLIMAZE) injection 50 mcg (50 mcg Intravenous Given 09/24/23 1246)  sodium bicarbonate injection 50 mEq (50 mEq Intravenous Given 09/25/23 1000)  albuterol (PROVENTIL) (2.5 MG/3ML) 0.083% nebulizer solution 10 mg (10 mg Nebulization Given 09/25/23 1136)  insulin aspart (novoLOG) injection 5 Units (5 Units Intravenous Given 09/25/23 1141)    And  dextrose 50 % solution 50 mL (50 mLs Intravenous Given 09/25/23 1141)  naloxone (NARCAN) injection 1 mg (1 mg Intravenous Given 09/25/23 1059)  sodium bicarbonate injection 100 mEq (100 mEq Intravenous Given 09/25/23 1500)  lactated ringers bolus 500 mL (0 mLs Intravenous Stopped 09/25/23 1550)  sodium chloride 0.9 % bolus 500 mL (500 mLs Intravenous New Bag/Given 09/25/23 2252)   IMPRESSION / MDM / ASSESSMENT AND PLAN / ED COURSE  I reviewed the triage vital signs and the nursing notes.                             The patient is on the cardiac monitor to evaluate for evidence of arrhythmia and/or significant heart rate changes. Patient's presentation is most consistent with acute presentation with potential threat to life or bodily function. The Pt presents with bilateral lower extremity wounds highly concerning for sepsis (suspected skin and soft tissue source). At this time, the Pt is satting well on room air, normotensive, and appears HDS.  Will start empiric antibiotics and fluids.  Due to likely sepsis, will administer fluids gradually with frequent reassessment. Have low suspicion for a GI, skin/soft tissue, or CNS source at this  time, but will reconsider if initial workup is unremarkable.  - CBC, BMP, LFTs - VBG - UA - BCx x2, Lactate - EKG - CXR - CT head showing no evidence of acute abnormalities - Empiric Abx: Rocephin, vancomycin - Fluids: 30cc/kg LR Given signs and symptoms of sepsis with possible bacteremia Dispo: Admit to medicine   FINAL CLINICAL IMPRESSION(S) / ED DIAGNOSES   Final diagnoses:  Sepsis without acute organ dysfunction, due to unspecified organism (HCC)  Altered mental status, unspecified altered mental status type   Rx / DC Orders   ED Discharge Orders     None      Note:  This document was prepared using Dragon voice recognition software and may include unintentional dictation errors.   Merwyn Katos, MD 09/25/23 [phone]

## 2023-09-24 NOTE — Sepsis Progress Note (Signed)
 Sepsis protocol monitored by eLink ?

## 2023-09-24 NOTE — ED Notes (Signed)
 This RN received report from Vivia Ewing RN and performed care handoff. This RN introduced self to pt. Call light in reach, bed wheels locked, side rail raised, pt updated on plan of care. Rounding completed. Fall risk precautions in place.

## 2023-09-24 NOTE — Assessment & Plan Note (Signed)
 Last A1c of 8.1%.  - Hold home regimen - SSI, moderate

## 2023-09-24 NOTE — Consult Note (Addendum)
 Pharmacy Antibiotic Note  [name] is a 68 y.o. female admitted on 09/24/2023 with left leg osteomyelitis. Patient was recently admitted from 09/04/2023 - 09/15/2023 and discharged on an OPAT regimen of cefepime 2 gm IV Q12H plus daptomycin 700 mg IV Q24H. Pharmacy has been consulted for cefepime and daptomcyin dosing.  Today, 09/24/2023 WBC 9.8  Afebrile  Patient in AKI; current SCr 2.75  Baseline appears to be ~ 1.5  Per RN, last doses of cefepime and daptomycin were given this morning around 8 AM OPAT regimen was ordered to end 10/19/2023  Plan: Given patient is in AKI with CrCl < 30 mL/min, will adjust OPAT antibiotics to  Cefepime 2 gm IV Q24H  Daptomycin 700 mg IV Q48H Pharmacy will continue to monitor and dose adjust appropriately   Height: 5\' 5"  (165.1 cm) Weight: 133 kg (293 lb 4.8 oz) IBW/kg (Calculated) : 57  Temp (24hrs), Avg:98.3 F (36.8 C), Min:98.3 F (36.8 C), Max:98.3 F (36.8 C)  Recent Labs  Lab 09/24/23 1214 09/24/23 1224  WBC 9.8  --   CREATININE 2.75*  --   LATICACIDVEN  --  2.7*    Estimated Creatinine Clearance: 26.3 mL/min (A) (by C-G formula based on SCr of 2.75 mg/dL (H)).    No Known Allergies  Antimicrobials this admission: 03/30 ceftriaxone x 1  03/30 vancomycin x 1  03/30 cefepime >>  03/30 daptomycin >>   Dose adjustments this admission:  Microbiology results: 03/15 Wound culture: C. Striatum, MSSA, P. stuartii  03/30 BCx: sent   Thank you for allowing pharmacy to be a part of this patient's care.  Littie Deeds, PharmD Pharmacy Resident  09/24/2023 2:18 PM

## 2023-09-24 NOTE — ED Notes (Signed)
 Pt having episodes of tachycardia ranging between 120-196. B, MD aware. Pt is in NAD. Equal rise and fall of chest noted.

## 2023-09-24 NOTE — Consult Note (Signed)
 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 The patient was recently discharged on IV antibiotics 9 days ago but was bough [facility]to the [facility] from the SN[facility] with possible sepsis. She is currently being managed by the wound care team though I do not see any recent notes. Orthopedics was consulted for possible amputation of the legs.   On examination the patient was not alert to person place or time and was obviously in pain with movement of her legs. She has multiple ulcerations of both legs that do not appear deep on the right side. On the left side posterolateral there is a deeper wound, but I do not see exposed bone.When compared to the pictures in the chart 2 weeks ago I do not see any progression.  Both feet are edematous and it is difficult to palpate a pulse. Both feet are warm and appear to be viable. Her wbc is not elevated. Esr is 84,  but has been elevated for the last 8 years. It is down from 2 weeks ago. Looking at the mri I do not see an abscess that needs to be drained. I suspect this is chronic osteo not acute osteomyelitis. I would not recommend an amputation at this time. I would recommend aggressive wound care with a possible una boot and possible whirlpool therapy. Would recommend a trial of IV antibiotics for at least 6 weeks with aggressive trial of wound care before considering an amputation.   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   Plan: Consult wound care team and consider whirlpool therapy vs an unna boot . I would not recommend an amputation at this time.

## 2023-09-24 NOTE — Assessment & Plan Note (Addendum)
 Per chart review, patient has a history of PAD, however most recent ABIs on 3/14 demonstrated relatively normal arterial waveform and velocities. Due to this, orthopedic surgery was consulted regarding osteomyelitis rather than vascular surgery.  - Continue home regimen

## 2023-09-24 NOTE — ED Triage Notes (Signed)
 Pt BIB [facility] from [facility] due to altered mental status. Staff stated that the pt is usually axo and has not been taking her po pills for the past 24-48 hours. She was recently hospitalized on the 10th for weakness. Pt has a PICC line and presents to the ED moaning and screaming. Pt is not answering questions and can not stand at baseline. No history of dementia.  154/48 34 end tital

## 2023-09-24 NOTE — Consult Note (Signed)
 CODE SEPSIS - PHARMACY COMMUNICATION  **Broad Spectrum Antibiotics should be administered within 1 hour of Sepsis diagnosis**  Time Code Sepsis Called/Page Received: 1214  Antibiotics Ordered: Ceftriaxone & Vancomycin  Time of 1st antibiotic administration: 1227  Additional action taken by pharmacy: N/A  If necessary, Name of Provider/Nurse Contacted: N/A  Littie Deeds, PharmD Pharmacy Resident  09/24/2023 12:19 PM

## 2023-09-24 NOTE — Assessment & Plan Note (Signed)
 Patient is presenting with tachycardia, tachypnea, elevated lactic acid complicated by acute encephalopathy and AKI with suspected acute on chronic bilateral lower extremity wound infection.    - Telemetry monitoring - Continue cefepime and daptomycin per pharmacy dosing - Blood and urine cultures pending - S/p 1 L bolus - Continue maintenance fluids

## 2023-09-24 NOTE — Assessment & Plan Note (Signed)
 Per chart review, patient has a history of HFrEF with last EF of 45% in 2018. No evidence of hypervolemia on examination at this time.  -Daily weights

## 2023-09-24 NOTE — H&P (Addendum)
 History and Physical    Patient: [name] [name] DOB: 26-Feb-1956 DOA: 09/24/2023 DOS: the patient was seen and examined on 09/24/2023 PCP: Pcp, No  Patient coming from: ALF/ILF  Chief Complaint:  Chief Complaint  Patient presents with   Altered Mental Status   HPI: [name] is a 68 y.o. female with medical history significant of chronic bilateral lower extremity wounds with recent diagnosis of left fibula osteomyelitis, HFrEF with last EF of 45%, chronic lymphedema, hypertension, type 2 diabetes, PAD, CKD stage IIIa, who presents to the ED due to altered mental status.  History obtained through chart review due to patient's altered mental status. Per facility staff at [facility], patient has become increasingly altered over the past 1 to 2 days and has been refusing to take her medications.  At baseline, she is alert and oriented x 4 with no underlying history of dementia.  No reports of fever.  Per chart review, patient was recently admitted on 09/04/2023 and was discovered to have a left fibula osteomyelitis.  Cultures grew Staph aureus and Providencia.  She was discharged on cefepime and daptomycin with plans to continue IV antibiotics through October 19, 2023.  ED course: On arrival to the ED, patient was normotensive at 117/104 with heart rate of 90.  She was saturating at 100% on room air.  She was afebrile at 98.3.  Initial workup notable for hemoglobin of 10.9, platelets 415, bicarb 21, BUN 38, creatinine 2.75, and GFR of 18.  Lactic acid 2.7.  Urinalysis with ketonuria, proteinuria, few bacteria.  CT head with no acute intracranial abnormalities.  Chest x-ray with low lung volumes.  Patient started on Rocephin, vancomycin, IV fluids and pain medication.  TR[facility] contacted for admission.  Review of Systems: unable to review all systems due to the inability of the patient to answer questions.  Past Medical History:  Diagnosis Date   Diabetes mellitus without  complication (HCC) 2010   Hypertension    Hypothyroidism    Kidney stone 11/2014   history of/STAGE 4 KIDNEY DISEASE PER DR Wynelle Link   Lymphedema    Multiple open wounds of lower leg    PT BEING SEEN BY THE [facility] C[facility] FOR CHRONIC BLISTERS PER PT   Past Surgical History:  Procedure Laterality Date   AMPUTATION Right 05/03/2021   Procedure: AMPUTATION RAY;  Surgeon: Rosetta Posner, DPM;  Location: [facility] ORS;  Service: Podiatry;  Laterality: Right;  RIght Big Toe and Right Fifth Toe   IRRIGATION AND DEBRIDEMENT FOOT Bilateral 05/03/2021   Procedure: IRRIGATION AND DEBRIDEMENT FEET;  Surgeon: Rosetta Posner, DPM;  Location: [facility] ORS;  Service: Podiatry;  Laterality: Bilateral;   LOWER EXTREMITY ANGIOGRAPHY Right 04/27/2021   Procedure: Lower Extremity Angiography;  Surgeon: Renford Dills, MD;  Location: [facility] INVASIVE CV LAB;  Service: Cardiovascular;  Laterality: Right;   LOWER EXTREMITY ANGIOGRAPHY Left 04/30/2021   Procedure: Lower Extremity Angiography;  Surgeon: Annice Needy, MD;  Location: [facility] INVASIVE CV LAB;  Service: Cardiovascular;  Laterality: Left;   TOTAL HIP ARTHROPLASTY Right 05/28/2015   Procedure: TOTAL HIP ARTHROPLASTY ANTERIOR APPROACH;  Surgeon: Kennedy Bucker, MD;  Location: [facility] ORS;  Service: Orthopedics;  Laterality: Right;   Social History:  reports that she has never smoked. She has never used smokeless tobacco. She reports that she does not drink alcohol and does not use drugs.  No Known Allergies  Family History  Problem Relation Age of Onset   Cancer Mother    Diabetes  Father     Prior to Admission medications   Medication Sig Start Date End Date Taking? Authorizing Provider  acetaminophen (TYLENOL) 325 MG tablet Take 2 tablets (650 mg total) by mouth every 6 (six) hours as needed for mild pain (or Fever >/= 101). 05/18/21   Alford Highland, MD  amLODipine (NORVASC) 5 MG tablet Take 5 mg by mouth daily.    [provider]  ascorbic acid (VITAMIN  C) 500 MG tablet Take 1 tablet (500 mg total) by mouth daily. 11/03/21   Alford Highland, MD  blood glucose meter kit and supplies 1 each by Other route daily. ONE TOUCH ULTRA METER. Use as directed. E11.29 10/23/20   Duanne Limerick, MD  camphor-menthol [facility]) lotion Apply topically as needed for itching. 09/15/23   Esaw Grandchild A, DO  ceFEPime (MAXIPIME) IVPB Inject 2 g into the vein every 12 (twelve) hours. Indication:  L leg (fibula) osteomyelitis Last Day of Therapy:  10/19/2023 Labs - Once weekly:  CBC/D, CMP, CPK, ESR and CRP Fax weekly lab results  promptly to [phone]  Method of administration: IV Push Method of administration may be changed at the discretion of facility and its pharmacy Please pull PIC at completion of IV antibiotics Call [phone] with critical values or questions 09/15/23 10/20/23  Esaw Grandchild A, DO  cloNIDine (CATAPRES) 0.3 MG tablet TAKE ONE TABLET BY MOUTH 3 TIMES DAILY. 10/22/20   Duanne Limerick, MD  clopidogrel (PLAVIX) 75 MG tablet Take 1 tablet (75 mg total) by mouth daily with breakfast. 05/19/21   Alford Highland, MD  daptomycin (CUBICIN) IVPB Inject 700 mg into the vein daily. Indication:  L leg (fibula) osteomyelitis Last Day of Therapy:  10/19/2023 Labs - Once weekly:  CBC/D, CMP, CPK, ESR and CRP Fax weekly lab results  promptly to [phone]  Method of administration: IV Push Method of administration may be changed at the discretion of facility and its pharmacy Please pull PIC at completion of IV antibiotics Call [phone] with critical values or questions 09/15/23 10/20/23  Esaw Grandchild A, DO  diclofenac Sodium (VOLTAREN) 1 % GEL Apply 2 g topically 3 (three) times daily.    [provider]  ferrous gluconate (FERGON) 324 MG tablet Take 324 mg by mouth daily with breakfast.    [provider]  furosemide (LASIX) 20 MG tablet Take 1 tablet (20 mg total) by mouth 2 (two) times daily. 05/18/21   Alford Highland, MD   gabapentin (NEURONTIN) 300 MG capsule Take 1 capsule (300 mg total) by mouth at bedtime. 09/15/23   Pennie Banter, DO  gentamicin ointment (GARAMYCIN) 0.1 % Apply 1 Application topically daily. 08/30/23   [provider]  glucose blood test strip 1 each by Other route daily. ONE TOUCH ULTRA TEST STRIPS E11.29 10/26/20   Duanne Limerick, MD  HYDROmorphone (DILAUDID) 2 MG tablet Take 0.5 tablets (1 mg total) by mouth every 4 (four) hours as needed for moderate pain (pain score 4-6). 09/15/23   Pennie Banter, DO  HYDROmorphone (DILAUDID) 2 MG tablet Take 1 tablet (2 mg total) by mouth every 12 (twelve) hours as needed (with dressing changes). 09/15/23   Pennie Banter, DO  hydrOXYzine (ATARAX) 10 MG tablet Take 10 mg by mouth every 8 (eight) hours as needed for itching.    [provider]  insulin aspart (NOVOLOG) 100 UNIT/ML injection Inject 3 Units into the skin 3 (three) times daily with meals. 05/18/21   Alford Highland,  MD  irbesartan (AVAPRO) 150 MG tablet Take 1 tablet (150 mg total) by mouth daily. 05/19/21   Alford Highland, MD  Lancets [facility] ULTRASOFT) lancets 1 each by Other route daily. Use as instructed E11.29 10/23/20   Duanne Limerick, MD  LANTUS SOLOSTAR 100 UNIT/ML Solostar Pen Inject 14 Units into the skin every 12 (twelve) hours. 09/15/23   Pennie Banter, DO  levothyroxine (SYNTHROID) 200 MCG tablet Take 200 mcg by mouth daily before breakfast.    [provider]  metoprolol succinate (TOPROL-XL) 25 MG 24 hr tablet Take 0.5 tablets (12.5 mg total) by mouth daily with breakfast. 05/19/21   Alford Highland, MD  Multiple Vitamin (MULTIVITAMIN WITH MINERALS) TABS tablet Take 1 tablet by mouth daily. 11/04/21   Alford Highland, MD  Nutritional Supplements (,FEEDING SUPPLEMENT, PROSOURCE PLUS) liquid Take 30 mLs by mouth 3 (three) times daily between meals. 11/03/21   Alford Highland, MD  Nystatin (GERHARDT'S BUTT CREAM) CREA Apply 1 Application  topically daily. 09/15/23   Esaw Grandchild A, DO  NYSTATIN powder Apply 1 application. topically 3 (three) times daily. 10/29/21   [provider]  ondansetron (ZOFRAN-ODT) 4 MG disintegrating tablet Take 2 mg by mouth every 6 (six) hours as needed. 10/21/21   [provider]  senna-docusate (SENOKOT-S) 8.6-50 MG tablet Take 2 tablets by mouth at bedtime.    [provider]  Vitamin D, Ergocalciferol, (DRISDOL) 1.25 MG (50000 UNIT) CAPS capsule Take 50,000 Units by mouth every 7 (seven) days.    [provider]    Physical Exam: Vitals:   09/24/23 1209 09/24/23 1211 09/24/23 1403  BP: (!) 117/104  (!) 147/102  Pulse: (!) 110    Resp:   20  Temp: 98.3 F (36.8 C)    TempSrc: Oral    SpO2: 100%    Weight:  133 kg   Height:  5\' 5"  (1.651 m)    Physical Exam Vitals and nursing note reviewed.  Constitutional:      Appearance: She is obese. She is ill-appearing.  HENT:     Head: Normocephalic and atraumatic.     Mouth/Throat:     Mouth: Mucous membranes are dry.     Pharynx: Oropharynx is clear.  Eyes:     Conjunctiva/sclera: Conjunctivae normal.     Pupils: Pupils are equal, round, and reactive to light.  Cardiovascular:     Rate and Rhythm: Regular rhythm. Tachycardia present.     Heart sounds: No murmur heard. Pulmonary:     Effort: Pulmonary effort is normal. Tachypnea present.     Breath sounds: Normal breath sounds. No decreased breath sounds, wheezing or rhonchi.  Abdominal:     General: Bowel sounds are normal. There is no distension.     Palpations: Abdomen is soft.  Musculoskeletal:     Right lower leg: No edema.     Left lower leg: No edema.  Skin:    General: Skin is warm and dry.     Coloration: Skin is pale.     Findings: Wound present.     Comments: Significant circumferential wounds to bilateral lower extremity with both purulent and frank bloody output.  On the left, there is an area where wound seems to be opening overlying  the tib-fib, site of known osteomyelitis.  Similar to the pictures in chart from prior admission  Neurological:     Mental Status: She is alert.     Comments:  Alert but only groaning and grimacing, not answering  questions. Will look towards speaker when spoken to though.  Moving all extremities against gravity  Psychiatric:        Behavior: Behavior is agitated.    Data Reviewed: CBC with WBC of 9.8, hemoglobin of 10.9, platelets of 450 CMP with sodium of 140, potassium 5.1, bicarb 21, glucose 155, BUN 38, creatinine 2.75, AST 23, ALT 14, GFR 18 Lactic acid 2.7 INR 1.4 COVID-19, influenza and RSV PCR negative Urinalysis with small hematuria, ketonuria, few bacteria.  EKG personally reviewed.  Sinus rhythm with rate of 115.  First-degree AV block.  No ST or T wave changes concerning for acute ischemia.  CT Head Wo Contrast Result Date: 09/24/2023 CLINICAL DATA:  Mental status change, unknown cause EXAM: CT HEAD WITHOUT CONTRAST TECHNIQUE: Contiguous axial images were obtained from the base of the skull through the vertex without intravenous contrast. RADIATION DOSE REDUCTION: This exam was performed according to the departmental dose-optimization program which includes automated exposure control, adjustment of the mA and/or kV according to patient size and/or use of iterative reconstruction technique. COMPARISON:  02/20/2012 FINDINGS: Brain: No evidence of acute infarction, hemorrhage, hydrocephalus, extra-axial collection or mass lesion/mass effect. Scattered low-density changes within the periventricular and subcortical white matter most compatible with chronic microvascular ischemic change. Mild diffuse cerebral volume loss. Vascular: Atherosclerotic calcifications involving the large vessels of the skull base. No unexpected hyperdense vessel. Skull: Normal. Negative for fracture or focal lesion. Sinuses/Orbits: No acute finding. Other: None. IMPRESSION: 1. No acute intracranial findings. 2.  Chronic microvascular ischemic change and cerebral volume loss. Electronically Signed   By: Duanne Guess D.O.   On: 09/24/2023 13:55   DG Chest Port 1 View Result Date: 09/24/2023 CLINICAL DATA:  Altered mental status. EXAM: PORTABLE CHEST 1 VIEW COMPARISON:  Oct 30, 2021. FINDINGS: The heart size and mediastinal contours are within normal limits. Hypoinflation of the lungs is noted with minimal bibasilar subsegmental atelectasis. Right-sided PICC line is noted with tip in expected position of the SVC. The visualized skeletal structures are unremarkable. IMPRESSION: Hypoinflation of the lungs with minimal bibasilar subsegmental atelectasis. Electronically Signed   By: Lupita Raider M.D.   On: 09/24/2023 12:32   Results are pending, will review when available.  Assessment and Plan:  * Sepsis (HCC) Patient is presenting with tachycardia, tachypnea, elevated lactic acid complicated by acute encephalopathy and AKI with suspected acute on chronic bilateral lower extremity wound infection.    - Telemetry monitoring - Continue cefepime and daptomycin per pharmacy dosing - Blood and urine cultures pending - S/p 1 L bolus - Continue maintenance fluids  Osteomyelitis of left fibula (HCC) Recent admission for bilateral lower extremity wounds with MRI demonstrating left fibula osteomyelitis.  Infectious disease was consulted given superficial wound cultures that demonstrated MSSA and Providencia stuartii.  She was discharged on cefepime and daptomycin via PICC line with plans to continue through 10/19/2023.  Patient is now presenting with sepsis, with concerns for progression of known osteo and possible new osteo.  Orthopedic surgery consulted given ABIs were normal on last admission.  Patient will continue to be bacteremic/sepsis risk as long as she does not have source control.  - Orthopedic surgery consulted; appreciate their recommendations - Continue daptomycin and cefepime per pharmacy  dosing - Consider ID consultation in the a.m.  Acute encephalopathy Patient is typically alert and oriented x 4 with no history of dementia, coming in encephalopathic.  Not able to answer any questions or follow commands.  Patient is persistently groaning with  inability to identify location of pain.  Suspected to be secondary to underlying infection  - N.p.o.  Acute kidney injury superimposed on CKD [facility]) Patient has a history of CKD stage IIIb with creatinine typically ranging between 1.1 and up to 1.6.  Currently 2.75.  - IV fluids as ordered - Hold nephrotoxic agents - Repeat BMP in the a.m. - Bladder scan given high risk for retention  Type II diabetes mellitus with renal manifestations (HCC) Last A1c of 8.1%.  - Hold home regimen - SSI, moderate  Chronic systolic CHF (congestive heart failure) (HCC) Per chart review, patient has a history of HFrEF with last EF of 45% in 2018. No evidence of hypervolemia on examination at this time.  -Daily weights  PVD (peripheral vascular disease) (HCC) Per chart review, patient has a history of PAD, however most recent ABIs on 3/14 demonstrated relatively normal arterial waveform and velocities. Due to this, orthopedic surgery was consulted regarding osteomyelitis rather than vascular surgery.  - Continue home regimen  Hypothyroidism - Resume home Synthroid when able to pass bedside swallow  Essential hypertension - Resume home regimen when able to pass bedside swallow  Advance Care Planning:   Code Status: Full Code as patient is unable to make medical decisions and no surrogate at bedside  Consults: Orthopedic Surgery  Family Communication: No family at bedside  Severity of Illness: The appropriate patient status for this patient is INPATIENT. Inpatient status is judged to be reasonable and necessary in order to provide the required intensity of service to ensure the patient's safety. The patient's presenting symptoms, physical  exam findings, and initial radiographic and laboratory data in the context of their chronic comorbidities is felt to place them at high risk for further clinical deterioration. Furthermore, it is not anticipated that the patient will be medically stable for discharge from the hospital within 2 midnights of admission.   * I certify that at the point of admission it is my clinical judgment that the patient will require inpatient hospital care spanning beyond 2 midnights from the point of admission due to high intensity of service, high risk for further deterioration and high frequency of surveillance required.*  Author: Verdene Lennert, MD 09/24/2023 3:16 PM  For on call review [email or web address].

## 2023-09-24 NOTE — Assessment & Plan Note (Signed)
 Patient has a history of CKD stage IIIb with creatinine typically ranging between 1.1 and up to 1.6.  Currently 2.75.  - IV fluids as ordered - Hold nephrotoxic agents - Repeat BMP in the a.m. - Bladder scan given high risk for retention

## 2023-09-24 NOTE — ED Notes (Signed)
 This RN gave report to [employer] via phone call and performed care handoff. Pt updated on plan of care.

## 2023-09-24 NOTE — Assessment & Plan Note (Signed)
-   Resume home regimen when able to pass bedside swallow

## 2023-09-24 NOTE — Assessment & Plan Note (Signed)
-   Resume home Synthroid when able to pass bedside swallow

## 2023-09-24 NOTE — Assessment & Plan Note (Signed)
 Recent admission for bilateral lower extremity wounds with MRI demonstrating left fibula osteomyelitis.  Infectious disease was consulted given superficial wound cultures that demonstrated MSSA and Providencia stuartii.  She was discharged on cefepime and daptomycin via PICC line with plans to continue through 10/19/2023.  Patient is now presenting with sepsis, with concerns for progression of known osteo and possible new osteo.  Orthopedic surgery consulted given ABIs were normal on last admission.  Patient will continue to be bacteremic/sepsis risk as long as she does not have source control.  - Orthopedic surgery consulted; appreciate their recommendations - Continue daptomycin and cefepime per pharmacy dosing - Consider ID consultation in the a.m.

## 2023-09-24 NOTE — Assessment & Plan Note (Signed)
 Patient is typically alert and oriented x 4 with no history of dementia, coming in encephalopathic.  Not able to answer any questions or follow commands.  Patient is persistently groaning with inability to identify location of pain.  Suspected to be secondary to underlying infection  - N.p.o.

## 2023-09-25 ENCOUNTER — Other Ambulatory Visit: Payer: Self-pay

## 2023-09-25 ENCOUNTER — Inpatient Hospital Stay

## 2023-09-25 ENCOUNTER — Encounter: Payer: Self-pay | Admitting: Internal Medicine

## 2023-09-25 DIAGNOSIS — G928 Other toxic encephalopathy: Secondary | ICD-10-CM | POA: Diagnosis not present

## 2023-09-25 DIAGNOSIS — N179 Acute kidney failure, unspecified: Secondary | ICD-10-CM | POA: Diagnosis not present

## 2023-09-25 DIAGNOSIS — T40601A Poisoning by unspecified narcotics, accidental (unintentional), initial encounter: Secondary | ICD-10-CM | POA: Diagnosis not present

## 2023-09-25 DIAGNOSIS — M86162 Other acute osteomyelitis, left tibia and fibula: Secondary | ICD-10-CM | POA: Diagnosis not present

## 2023-09-25 DIAGNOSIS — I509 Heart failure, unspecified: Secondary | ICD-10-CM

## 2023-09-25 DIAGNOSIS — N189 Chronic kidney disease, unspecified: Secondary | ICD-10-CM | POA: Diagnosis not present

## 2023-09-25 DIAGNOSIS — A419 Sepsis, unspecified organism: Secondary | ICD-10-CM | POA: Diagnosis not present

## 2023-09-25 DIAGNOSIS — G9341 Metabolic encephalopathy: Secondary | ICD-10-CM | POA: Diagnosis not present

## 2023-09-25 DIAGNOSIS — J9601 Acute respiratory failure with hypoxia: Secondary | ICD-10-CM

## 2023-09-25 DIAGNOSIS — E8721 Acute metabolic acidosis: Secondary | ICD-10-CM

## 2023-09-25 LAB — CBC WITH DIFFERENTIAL/PLATELET
Abs Immature Granulocytes: 0.12 10*3/uL — ABNORMAL HIGH (ref 0.00–0.07)
Abs Immature Granulocytes: 0.03 10*3/uL (ref 0.00–0.07)
Basophils Absolute: 0.1 10*3/uL (ref 0.0–0.1)
Basophils Absolute: 0.1 10*3/uL (ref 0.0–0.1)
Basophils Relative: 1 %
Basophils Relative: 1 %
Eosinophils Absolute: 0 10*3/uL (ref 0.0–0.5)
Eosinophils Absolute: 0 10*3/uL (ref 0.0–0.5)
Eosinophils Relative: 0 %
Eosinophils Relative: 0 %
HCT: 33.4 % — ABNORMAL LOW (ref 36.0–46.0)
HCT: 30.5 % — ABNORMAL LOW (ref 36.0–46.0)
Hemoglobin: 10.1 g/dL — ABNORMAL LOW (ref 12.0–15.0)
Hemoglobin: 9.3 g/dL — ABNORMAL LOW (ref 12.0–15.0)
Immature Granulocytes: 1 %
Immature Granulocytes: 0 %
Lymphocytes Relative: 2 %
Lymphocytes Relative: 11 %
Lymphs Abs: 0.4 10*3/uL — ABNORMAL LOW (ref 0.7–4.0)
Lymphs Abs: 1.3 10*3/uL (ref 0.7–4.0)
MCH: 27.8 pg (ref 26.0–34.0)
MCH: 27.5 pg (ref 26.0–34.0)
MCHC: 30.2 g/dL (ref 30.0–36.0)
MCHC: 30.5 g/dL (ref 30.0–36.0)
MCV: 92 fL (ref 80.0–100.0)
MCV: 90.2 fL (ref 80.0–100.0)
Monocytes Absolute: 0.8 10*3/uL (ref 0.1–1.0)
Monocytes Absolute: 0.9 10*3/uL (ref 0.1–1.0)
Monocytes Relative: 5 %
Monocytes Relative: 7 %
Neutro Abs: 16.1 10*3/uL — ABNORMAL HIGH (ref 1.7–7.7)
Neutro Abs: 10.3 10*3/uL — ABNORMAL HIGH (ref 1.7–7.7)
Neutrophils Relative %: 91 %
Neutrophils Relative %: 81 %
Platelets: 398 10*3/uL (ref 150–400)
Platelets: 361 10*3/uL (ref 150–400)
RBC: 3.63 MIL/uL — ABNORMAL LOW (ref 3.87–5.11)
RBC: 3.38 MIL/uL — ABNORMAL LOW (ref 3.87–5.11)
RDW: 19.3 % — ABNORMAL HIGH (ref 11.5–15.5)
RDW: 19.3 % — ABNORMAL HIGH (ref 11.5–15.5)
WBC: 17.6 10*3/uL — ABNORMAL HIGH (ref 4.0–10.5)
WBC: 12.6 10*3/uL — ABNORMAL HIGH (ref 4.0–10.5)
nRBC: 0 % (ref 0.0–0.2)
nRBC: 0 % (ref 0.0–0.2)

## 2023-09-25 LAB — LACTIC ACID, PLASMA
Lactic Acid, Venous: 3.6 mmol/L (ref 0.5–1.9)
Lactic Acid, Venous: 3.4 mmol/L (ref 0.5–1.9)
Lactic Acid, Venous: 1.3 mmol/L (ref 0.5–1.9)

## 2023-09-25 LAB — PROTIME-INR
INR: 1.4 — ABNORMAL HIGH (ref 0.8–1.2)
Prothrombin Time: 17.8 s — ABNORMAL HIGH (ref 11.4–15.2)

## 2023-09-25 LAB — COMPREHENSIVE METABOLIC PANEL WITH GFR
ALT: 12 U/L (ref 0–44)
AST: 17 U/L (ref 15–41)
Albumin: 2.2 g/dL — ABNORMAL LOW (ref 3.5–5.0)
Alkaline Phosphatase: 69 U/L (ref 38–126)
Anion gap: 14 (ref 5–15)
BUN: 42 mg/dL — ABNORMAL HIGH (ref 8–23)
CO2: 19 mmol/L — ABNORMAL LOW (ref 22–32)
Calcium: 8.7 mg/dL — ABNORMAL LOW (ref 8.9–10.3)
Chloride: 106 mmol/L (ref 98–111)
Creatinine, Ser: 2.85 mg/dL — ABNORMAL HIGH (ref 0.44–1.00)
GFR, Estimated: 17 mL/min — ABNORMAL LOW (ref >60)
Glucose, Bld: 212 mg/dL — ABNORMAL HIGH (ref 70–99)
Potassium: 5.8 mmol/L — ABNORMAL HIGH (ref 3.5–5.1)
Sodium: 139 mmol/L (ref 135–145)
Total Bilirubin: 0.6 mg/dL (ref 0.0–1.2)
Total Protein: 7.1 g/dL (ref 6.5–8.1)

## 2023-09-25 LAB — BASIC METABOLIC PANEL WITH GFR
Anion gap: 16 — ABNORMAL HIGH (ref 5–15)
BUN: 45 mg/dL — ABNORMAL HIGH (ref 8–23)
CO2: 17 mmol/L — ABNORMAL LOW (ref 22–32)
Calcium: 8.7 mg/dL — ABNORMAL LOW (ref 8.9–10.3)
Chloride: 107 mmol/L (ref 98–111)
Creatinine, Ser: 3.23 mg/dL — ABNORMAL HIGH (ref 0.44–1.00)
GFR, Estimated: 15 mL/min — ABNORMAL LOW (ref >60)
Glucose, Bld: 264 mg/dL — ABNORMAL HIGH (ref 70–99)
Potassium: 4.9 mmol/L (ref 3.5–5.1)
Sodium: 140 mmol/L (ref 135–145)

## 2023-09-25 LAB — BLOOD GAS, ARTERIAL
Acid-base deficit: 7.3 mmol/L — ABNORMAL HIGH (ref 0.0–2.0)
Bicarbonate: 16.9 mmol/L — ABNORMAL LOW (ref 20.0–28.0)
O2 Saturation: 99.3 %
Patient temperature: 37
pCO2 arterial: 30 mmHg — ABNORMAL LOW (ref 32–48)
pH, Arterial: 7.36 (ref 7.35–7.45)
pO2, Arterial: 107 mmHg (ref 83–108)

## 2023-09-25 LAB — URINE CULTURE: Culture: NO GROWTH

## 2023-09-25 LAB — MRSA NEXT GEN BY PCR, NASAL: MRSA by PCR Next Gen: NOT DETECTED

## 2023-09-25 LAB — APTT: aPTT: 37 s — ABNORMAL HIGH (ref 24–36)

## 2023-09-25 LAB — CBG MONITORING, ED
Glucose-Capillary: 195 mg/dL — ABNORMAL HIGH (ref 70–99)
Glucose-Capillary: 259 mg/dL — ABNORMAL HIGH (ref 70–99)
Glucose-Capillary: 266 mg/dL — ABNORMAL HIGH (ref 70–99)
Glucose-Capillary: 247 mg/dL — ABNORMAL HIGH (ref 70–99)

## 2023-09-25 LAB — GLUCOSE, CAPILLARY: Glucose-Capillary: 199 mg/dL — ABNORMAL HIGH (ref 70–99)

## 2023-09-25 LAB — BETA-HYDROXYBUTYRIC ACID: Beta-Hydroxybutyric Acid: 0.74 mmol/L — ABNORMAL HIGH (ref 0.05–0.27)

## 2023-09-25 LAB — CK: Total CK: 36 U/L — ABNORMAL LOW (ref 38–234)

## 2023-09-25 LAB — PHOSPHORUS: Phosphorus: 5.9 mg/dL — ABNORMAL HIGH (ref 2.5–4.6)

## 2023-09-25 LAB — MAGNESIUM: Magnesium: 2 mg/dL (ref 1.7–2.4)

## 2023-09-25 MED ORDER — INSULIN ASPART 100 UNIT/ML IV SOLN
5.0000 [IU] | Freq: Once | INTRAVENOUS | Status: AC
  Administered 2023-09-25: 5 [IU] via INTRAVENOUS
  Filled 2023-09-25: qty 0.05

## 2023-09-25 MED ORDER — SODIUM ZIRCONIUM CYCLOSILICATE 10 G PO PACK
10.0000 g | PACK | Freq: Three times a day (TID) | ORAL | Status: DC
  Filled 2023-09-25 (×2): qty 1

## 2023-09-25 MED ORDER — SODIUM ZIRCONIUM CYCLOSILICATE 10 G PO PACK
10.0000 g | PACK | Freq: Once | ORAL | Status: DC
  Filled 2023-09-25: qty 1

## 2023-09-25 MED ORDER — SODIUM BICARBONATE 8.4 % IV SOLN
100.0000 meq | Freq: Once | INTRAVENOUS | Status: AC
  Administered 2023-09-25: 100 meq via INTRAVENOUS
  Filled 2023-09-25: qty 100
  Filled 2023-09-25: qty 50

## 2023-09-25 MED ORDER — LACTATED RINGERS IV BOLUS
500.0000 mL | Freq: Once | INTRAVENOUS | Status: AC
  Administered 2023-09-25: 500 mL via INTRAVENOUS

## 2023-09-25 MED ORDER — NALOXONE HCL 0.4 MG/ML IJ SOLN: 0.4000 mg | INTRAMUSCULAR | Status: DC | PRN

## 2023-09-25 MED ORDER — ACETAMINOPHEN 325 MG PO TABS: 650.0000 mg | ORAL_TABLET | Freq: Four times a day (QID) | ORAL | Status: DC | PRN

## 2023-09-25 MED ORDER — SODIUM CHLORIDE 0.9 % IV BOLUS
500.0000 mL | Freq: Once | INTRAVENOUS | Status: AC
  Administered 2023-09-25: 500 mL via INTRAVENOUS

## 2023-09-25 MED ORDER — INSULIN GLARGINE-YFGN 100 UNIT/ML ~~LOC~~ SOLN
15.0000 [IU] | Freq: Every day | SUBCUTANEOUS | Status: DC
  Administered 2023-09-25 – 2023-10-01 (×5): 15 [IU] via SUBCUTANEOUS
  Filled 2023-09-25 (×8): qty 0.15

## 2023-09-25 MED ORDER — SODIUM BICARBONATE 8.4 % IV SOLN
50.0000 meq | Freq: Once | INTRAVENOUS | Status: AC
  Administered 2023-09-25: 50 meq via INTRAVENOUS
  Filled 2023-09-25: qty 50

## 2023-09-25 MED ORDER — INSULIN GLARGINE 100 UNITS/ML SOLOSTAR PEN: 15.0000 [IU] | PEN_INJECTOR | Freq: Every day | SUBCUTANEOUS | Status: DC

## 2023-09-25 MED ORDER — ALBUTEROL SULFATE (2.5 MG/3ML) 0.083% IN NEBU
10.0000 mg | INHALATION_SOLUTION | Freq: Once | RESPIRATORY_TRACT | Status: AC
  Administered 2023-09-25: 10 mg via RESPIRATORY_TRACT
  Filled 2023-09-25: qty 12

## 2023-09-25 MED ORDER — NALOXONE HCL 2 MG/2ML IJ SOSY: 1.0000 mg | PREFILLED_SYRINGE | Freq: Once | INTRAMUSCULAR | Status: AC

## 2023-09-25 MED ORDER — HYDROMORPHONE HCL 1 MG/ML IJ SOLN: 0.5000 mg | Freq: Four times a day (QID) | INTRAMUSCULAR | Status: DC | PRN

## 2023-09-25 MED ORDER — NALOXONE HCL 2 MG/2ML IJ SOSY
PREFILLED_SYRINGE | INTRAMUSCULAR | Status: AC
  Administered 2023-09-25: 1 mg via INTRAVENOUS
  Filled 2023-09-25: qty 2

## 2023-09-25 MED ORDER — LEVOTHYROXINE SODIUM 100 MCG PO TABS
200.0000 ug | ORAL_TABLET | Freq: Every day | ORAL | Status: DC
Start: 2023-09-26 — End: 2023-09-26

## 2023-09-25 MED ORDER — DEXTROSE 50 % IV SOLN
1.0000 | Freq: Once | INTRAVENOUS | Status: AC
  Administered 2023-09-25: 50 mL via INTRAVENOUS
  Filled 2023-09-25: qty 50

## 2023-09-25 MED ORDER — LACTATED RINGERS IV SOLN: INTRAVENOUS | Status: DC

## 2023-09-25 MED ORDER — HEPARIN SODIUM (PORCINE) 5000 UNIT/ML IJ SOLN
5000.0000 [IU] | Freq: Three times a day (TID) | INTRAMUSCULAR | Status: DC
  Administered 2023-09-25 – 2023-09-30 (×15): 5000 [IU] via SUBCUTANEOUS
  Filled 2023-09-25 (×15): qty 1

## 2023-09-25 NOTE — Consult Note (Signed)
 NAME: [name]  DOB: 18-Sep-1955  MRN: [id number]  Date/Time: 09/25/2023 10:32 PM  REQUESTING PROVIDER: Dr.Kumar Subjective:  REASON FOR CONSULT: sepsis ? [name] is a 68 y.o. 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 She was also sent on Hydromorphone She presents from SNF with altered mental status and not eating and drinking for a few days Vitals in the ED  09/24/23 12:09  BP 117/104 (H)  Temp 98.3 F (36.8 C)  Pulse Rate 110 !  SpO2 100 %     Latest Reference Range & Units 09/24/23 12:14  WBC 4.0 - 10.5 K/uL 9.8  Hemoglobin 12.0 - 15.0 g/dL 78.2 (L)  HCT 95.6 - 21.3 % 36.1  Platelets 150 - 400 K/uL 415 (H)  Creatinine 0.44 - 1.00 mg/dL 0.86 (H)   She is obtunded I am asked to see her for possible sepsis In the Ed she got fentanyl, and few doses of dilaudid CT head no acute findings CXR hypoventilation EKG sinus tachycardia  Past Medical History:  Diagnosis Date   Diabetes mellitus without complication (HCC) 2010   Hypertension    Hypothyroidism    Kidney stone 11/2014   history of/STAGE 4 KIDNEY DISEASE PER DR Wynelle Link   Lymphedema    Multiple open wounds of lower leg    PT BEING SEEN BY THE WOUND CENTER FOR CHRONIC BLISTERS PER PT    Past Surgical History:  Procedure Laterality Date   AMPUTATION Right 05/03/2021   Procedure: AMPUTATION RAY;  Surgeon: Rosetta Posner, DPM;  Location: [facility] ORS;  Service: Podiatry;  Laterality: Right;  RIght Big Toe and Right Fifth Toe   IRRIGATION AND DEBRIDEMENT FOOT Bilateral 05/03/2021   Procedure: IRRIGATION AND DEBRIDEMENT FEET;  Surgeon: Rosetta Posner, DPM;  Location: [facility] ORS;  Service:  Podiatry;  Laterality: Bilateral;   LOWER EXTREMITY ANGIOGRAPHY Right 04/27/2021   Procedure: Lower Extremity Angiography;  Surgeon: Renford Dills, MD;  Location: [facility] INVASIVE CV LAB;  Service: Cardiovascular;  Laterality: Right;   LOWER EXTREMITY ANGIOGRAPHY Left 04/30/2021   Procedure: Lower Extremity Angiography;  Surgeon: Annice Needy, MD;  Location: [facility] INV[facility] CV LAB;  Service: Cardiovascular;  Laterality: Left;   TOTAL HIP ARTHROPLASTY Right 05/28/2015   Procedure: TOTAL HIP ARTHROPLASTY ANTERIOR APPROACH;  Surgeon: Kennedy Bucker, MD;  Location: [facility] ORS;  Service: Orthopedics;  Laterality: Right;    Social History   Socioeconomic History   Marital status: Single    Spouse name: Not on file   Number of children: 0   Years of education: Not on file   Highest education level: Not on file  Occupational History   Not on file  Tobacco Use   Smoking status: Never   Smokeless tobacco: Never  Vaping Use   Vaping status: Never Used  Substance and Sexual Activity   Alcohol use: No   Drug use: No   Sexual activity: Never  Other Topics Concern   Not on file  Social History Narrative   Pt lives alone   Social Drivers of Health   Financial Resource Strain: Low Risk  (03/10/2021)   Overall Financial Resource Strain (CARDIA)    Difficulty of  Paying Living Expenses: Not very hard  Food Insecurity: No Food Insecurity (09/25/2023)   Hunger Vital Sign    Worried About Running Out of Food in the Last Year: Never true    Ran Out of Food in the Last Year: Never true  Transportation Needs: No Transportation Needs (09/25/2023)   PRAPARE - Administrator, Civil Service (Medical): No    Lack of Transportation (Non-Medical): No  Physical Activity: Inactive (03/10/2021)   Exercise Vital Sign    Days of Exercise per Week: 0 days    Minutes of Exercise per Session: 0 min  Stress: No Stress Concern Present (03/10/2021)   [employer] of Occupational Health - Occupational  Stress Questionnaire    Feeling of Stress : Only a little  Social Connections: Socially Isolated (09/25/2023)   Social Connection and Isolation Panel [NHANES]    Frequency of Communication with Friends and Family: More than three times a week    Frequency of Social Gatherings with Friends and Family: Three times a week    Attends Religious Services: Never    Active Member of Clubs or Organizations: No    Attends Banker Meetings: Never    Marital Status: Never married  Intimate Partner Violence: Not At Risk (09/25/2023)   Humiliation, Afraid, Rape, and Kick questionnaire    Fear of Current or Ex-Partner: No    Emotionally Abused: No    Physically Abused: No    Sexually Abused: No    Family History  Problem Relation Age of Onset   Cancer Mother    Diabetes Father    No Known Allergies I? Current Facility-Administered Medications  Medication Dose Route Frequency Provider Last Rate Last Admin   acetaminophen (TYLENOL) tablet 650 mg  650 mg Oral Q6H PRN Gillis Santa, MD       heparin injection 5,000 Units  5,000 Units Subcutaneous Q8H Mila Merry A, RPH   5,000 Units at 09/25/23 2119   HYDROmorphone (DILAUDID) injection 0.5-1 mg  0.5-1 mg Intravenous Q6H PRN Gillis Santa, MD       insulin aspart (novoLOG) injection 0-15 Units  0-15 Units Subcutaneous TID WC Verdene Lennert, MD   5 Units at 09/25/23 1649   insulin glargine-yfgn [facility]) injection 15 Units  15 Units Subcutaneous Daily Orson Aloe, [facility]   15 Units at 09/25/23 2119   lactated ringers infusion   Intravenous Continuous Gillis Santa, MD 125 mL/hr at 09/25/23 1932 New Bag at 09/25/23 1932   [START ON 09/26/2023] levothyroxine (SYNTHROID) tablet 200 mcg  200 mcg Oral Q0600 Gillis Santa, MD       naloxone [facility]) injection 0.4 mg  0.4 mg Intravenous PRN Gillis Santa, MD       ondansetron [facility]) injection 4 mg  4 mg Intravenous Q6H PRN Verdene Lennert, MD   4 mg at 09/25/23 0245   sodium chloride flush  (NS) 0.9 % injection 3 mL  3 mL Intravenous Q12H Verdene Lennert, MD   3 mL at 09/25/23 2045   sodium zirconium cyclosilicate (LOKELMA) packet 10 g  10 g Oral Once Gillis Santa, MD         Abtx:  Anti-infectives (From admission, onward)    Start     Dose/Rate Route Frequency Ordered Stop   09/26/23 1400  DAPTOmycin (CUBICIN) IVPB 700 mg/112mL premix  Status:  Discontinued        700 mg 200 mL/hr over 30 Minutes Intravenous Every 48 hours 09/24/23 1517 09/25/23 1352  09/25/23 1400  DAPTOmycin (CUBICIN) IVPB 700 mg/165mL premix  Status:  Discontinued        700 mg 200 mL/hr over 30 Minutes Intravenous Daily 09/24/23 1510 09/24/23 1517   09/25/23 0800  ceFEPIme (MAXIPIME) 2 g in sodium chloride 0.9 % 100 mL IVPB  Status:  Discontinued        2 g 200 mL/hr over 30 Minutes Intravenous Every 24 hours 09/24/23 1510 09/25/23 1352   09/24/23 1215  cefTRIAXone (ROCEPHIN) 2 g in sodium chloride 0.9 % 100 mL IVPB        2 g 200 mL/hr over 30 Minutes Intravenous Once 09/24/23 1214 09/24/23 1316   09/24/23 1215  vancomycin (VANCOCIN) IVPB 1000 mg/200 mL premix        1,000 mg 200 mL/hr over 60 Minutes Intravenous  Once 09/24/23 1214 09/24/23 1421       REVIEW OF SYSTEMS:  NA Objective:  VITALS:  BP (!) 109/53 (BP Location: Left Wrist)   Pulse (!) 126   Temp 97.7 F (36.5 C) (Axillary)   Resp 12   Ht 5\' 5"  (1.651 m)   Wt 133 kg   SpO2 99%   BMI 48.81 kg/m  LDA Rt PICc PHYSICAL EXAM:  General: obtunded Non veral Myoclonus twitching.  Head: Normocephalic, without obvious abnormality, atraumatic. Eyes: Conjunctivae clear, anicteric sclerae. Pupils are equal ENT Nares normal. No drainage or sinus tenderness. Lips, mucosa, and tongue normal. No Thrush Neck: , symmetrical, no adenopathy, thyroid: non tender no carotid bruit and no JVD. Lungs: Clear to auscultation bilaterally. No Wheezing or Rhonchi. No rales. Heart: Tachycardia. Abdomen: Soft, non-tender,not distended. Bowel  sounds normal. No masses Extremities: left foot wound   lAst admisison   Skin: as above Lymph: Cervical, supraclavicular normal. Neurologic: cannot assessl Pertinent Labs Lab Results CBC    Component Value Date/Time   WBC 17.6 (H) 09/25/2023 0602   RBC 3.63 (L) 09/25/2023 0602   HGB 10.1 (L) 09/25/2023 0602   HGB 12.5 02/20/2012 0737   HCT 33.4 (L) 09/25/2023 0602   HCT 37.6 02/20/2012 0737   PLT 398 09/25/2023 0602   PLT 230 02/20/2012 0737   MCV 92.0 09/25/2023 0602   MCV 86 02/20/2012 0737   MCH 27.8 09/25/2023 0602   MCHC 30.2 09/25/2023 0602   RDW 19.3 (H) 09/25/2023 0602   RDW 15.1 (H) 02/20/2012 0737   LYMPHSABS 0.4 (L) 09/25/2023 0602   MONOABS 0.8 09/25/2023 0602   EOSABS 0.0 09/25/2023 0602   BASOSABS 0.1 09/25/2023 0602       Latest Ref Rng & Units 09/25/2023    1:51 PM 09/25/2023    6:02 AM 09/24/2023   12:14 PM  CMP  Glucose 70 - 99 mg/dL 725  366  440   BUN 8 - 23 mg/dL 45  42  38   Creatinine 0.44 - 1.00 mg/dL 3.47  4.25  9.56   Sodium 135 - 145 mmol/L 140  139  140   Potassium 3.5 - 5.1 mmol/L 4.9  5.8  5.1   Chloride 98 - 111 mmol/L 107  106  108   CO2 22 - 32 mmol/L 17  19  21    Calcium 8.9 - 10.3 mg/dL 8.7  8.7  8.8   Total Protein 6.5 - 8.1 g/dL  7.1  7.4   Total Bilirubin 0.0 - 1.2 mg/dL  0.6  0.6   Alkaline Phos 38 - 126 U/L  69  76   AST 15 - 41 U/L  17  23   ALT 0 - 44 U/L  12  14       Microbiology: Recent Results (from the past 240 hours)  Resp panel by RT-PCR (RSV, Flu A&B, Covid) Anterior Nasal Swab     Status: None   Collection Time: 09/24/23 12:21 PM   Specimen: Anterior Nasal Swab  Result Value Ref Range Status   SARS Coronavirus 2 by RT PCR NEGATIVE NEGATIVE Final    Comment: (NOTE) SARS-CoV-2 target nucleic acids are NOT DETECTED.  The SARS-CoV-2 RNA is generally detectable in upper respiratory specimens during the acute phase of infection. The lowest concentration of SARS-CoV-2 viral copies this assay can detect  is 138 copies/mL. A negative result does not preclude SARS-Cov-2 infection and should not be used as the sole basis for treatment or other patient management decisions. A negative result may occur with  improper specimen collection/handling, submission of specimen other than nasopharyngeal swab, presence of viral mutation(s) within the areas targeted by this assay, and inadequate number of viral copies(<138 copies/mL). A negative result must be combined with clinical observations, patient history, and epidemiological information. The expected result is Negative.  Fact Sheet for Patients:  BloggerCourse.com  Fact Sheet for Healthcare Providers:  SeriousBroker.it  This test is no t yet approved or cleared by the [place] FDA and  has been authorized for detection and/or diagnosis of SARS-CoV-2 by FDA under an Emergency Use Authorization (EUA). This EUA will remain  in effect (meaning this test can be used) for the duration of the COVID-19 declaration under Section 564(b)(1) of the Act, 21 U.S.C.section 360bbb-3(b)(1), unless the authorization is terminated  or revoked sooner.       Influenza A by PCR NEGATIVE NEGATIVE Final   Influenza B by PCR NEGATIVE NEGATIVE Final    Comment: (NOTE) The Xpert Xpress SARS-CoV-2/FLU/RSV plus assay is intended as an aid in the diagnosis of influenza from Nasopharyngeal swab specimens and should not be used as a sole basis for treatment. Nasal washings and aspirates are unacceptable for Xpert Xpress SARS-CoV-2/FLU/RSV testing.  Fact Sheet for Patients: BloggerCourse.com  Fact Sheet for Healthcare Providers: SeriousBroker.it  This test is not yet approved or cleared by the [place] FDA and has been authorized for detection and/or diagnosis of SARS-CoV-2 by FDA under an Emergency Use Authorization (EUA). This EUA will remain in effect  (meaning this test can be used) for the duration of the COVID-19 declaration under Section 564(b)(1) of the Act, 21 U.S.C. section 360bbb-3(b)(1), unless the authorization is terminated or revoked.     Resp Syncytial Virus by PCR NEGATIVE NEGATIVE Final    Comment: (NOTE) Fact Sheet for Patients: BloggerCourse.com  Fact Sheet for Healthcare Providers: SeriousBroker.it  This test is not yet approved or cleared by the [place] FDA and has been authorized for detection and/or diagnosis of SARS-CoV-2 by FDA under an Emergency Use Authorization (EUA). This EUA will remain in effect (meaning this test can be used) for the duration of the COVID-19 declaration under Section 564(b)(1) of the Act, 21 U.S.C. section 360bbb-3(b)(1), unless the authorization is terminated or revoked.  Performed at [facility], [address], [place], [place] [address]   Urine Culture     Status: None   Collection Time: 09/24/23 12:21 PM   Specimen: Urine, Random  Result Value Ref Range Status   Specimen Description   Final    URINE, RANDOM Performed at [facility], [address], [place], [place] [address]  Special Requests   Final    NONE Reflexed from [id number] Performed at [facility], [address] [place], [place] [address]    Culture   Final    NO GROWTH Performed at [facility], [address], [place], [place] [address]    Report Status 09/25/2023 FINAL  Final  Blood Culture (routine x 2)     Status: None (Preliminary result)   Collection Time: 09/24/23 12:22 PM   Specimen: BLOOD LEFT FOREARM  Result Value Ref Range Status   Specimen Description BLOOD LEFT FOREARM  Final   Special Requests   Final    BOTTLES DRAWN AEROBIC ONLY Blood Culture results may not be optimal due to an inadequate volume of blood received in culture bottles   Culture   Final    NO GROWTH < 24 HOURS Performed at [facility], [address] [place], [place] [address]    Report Status PENDING  Incomplete  Blood Culture (routine x 2)     Status: None (Preliminary result)   Collection Time: 09/24/23 12:23 PM   Specimen: Left Antecubital; Blood  Result Value Ref Range Status   Specimen Description LEFT ANTECUBITAL  Final   Special Requests   Final    BOTTLES DRAWN AEROBIC AND ANAEROBIC Blood Culture adequate volume   Culture   Final    NO GROWTH < 24 HOURS Performed at [facility], [address] [place], [place] [address]    Report Status PENDING  Incomplete  MRSA Next Gen by PCR, Nasal     Status: None   Collection Time: 09/25/23  8:44 PM   Specimen: Nasal Mucosa; Nasal Swab  Result Value Ref Range Status   MRSA by PCR Next Gen NOT DETECTED NOT DETECTED Final    Comment: (NOTE) The GeneXpert MRSA Assay (FDA approved for NASAL specimens only), is one component of a comprehensive MRSA colonization surveillance program. It is not intended to diagnose MRSA infection nor to guide or monitor treatment for MRSA infections. Test performance is not FDA approved in patients less than 62 years old. Performed at [facility], [address] [place], [place] [address]     IMAGING RESULTS: CT head nothing acute I have personally reviewed the films ? Impression/Recommendation  68 y.o. 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 She presents from SNF with altered mental status and not eating and drinking for a few days? ? ?  Acute metabolic encephalopathy- due to AKI on CKD and CNS effect of opioids and cefepime causing encephalopathy, myoclonus. She received  narcan with not much of a change I doubt this is sepsis especially with her being on broad spectrum antibiotics Rather will hold antibiotics till her mental status clears She may not need Iv antibiotics for the osteo as it is unlikely to heal because of her immobility and pressure wounds  AKI on CKD-   Obesity  Bed bound status   B/l venous wounds legs   Discussed the management with hospitalist and intensivist  ________________________________________________

## 2023-09-25 NOTE — Consult Note (Signed)
 NAME:  [name], MRN:  [id number], DOB:  [birth year], LOS: 1 ADMISSION DATE:  09/24/2023, CONSULTATION DATE:  09/25/2023 REFERRING MD:  Gillis Santa, MD, CHIEF COMPLAINT:  Altered Mental Status   History of Present Illness:   Patient is a 68 year old female presenting to the hospital with altered mental status.  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  She was recently admitted to the hospital on 09/04/2023 - 09/15/2023 with lower extremity pain and wounds were MRI of the left leg showed osteomyelitis and for which she was started on IV antibiotics (Cefepime, Daptomycin) and discharged to skilled nursing facility for further care.  Patient was reported to normally be alert and oriented x 3 but has been altered over the past 48 hours at her facility prompting transfer to the emergency department.  In the ED, she was moaning with intermittent screaming.  CT of the head was negative for any bleed or any acute findings (notable for chronic microvascular ischemic changes and cerebral volume loss).  Lactic acid was mildly elevated at 2.7, improved on repeat to 2.2.  Rest of blood work showed leukocytosis and AKI on CKD.  Patient received 50 mcg of fentanyl and a cumulative dose of 3.5 mg of hydromorphone since presentation. She was discharged on PO hydromorphone to her SNF.  Patient was somnolent at the time of my evaluation but aroused to tactile/painful stimuli.  History was unobtainable at the bedside.  We proceeded to administer 1 mg of IV naloxone with brisk improvement in mental status.  Patient was moaning, and in pain following naloxone administration but remained nonsensical.  Pertinent  Medical History  -Type 2 diabetes (history of DKA in the past) -Heart failure -On chronic  narcotics -PAD -Hypothyroidism -Hypertension -CKD -Decub ulcers -Lower extremity wounds/osteomyelitis   Significant Hospital Events: Including procedures, antibiotic start and stop dates in addition to other pertinent events   09/24/2023: Admit to [facility] 09/25/2023: PCCM consulted for altered mental status, 1 mg of IV naloxone administered  Interim History / Subjective:  Somnolent but arouses to painful stimuli on my examination.  Improvement in mental status following IV naloxone administration.  Objective   Blood pressure 135/70, pulse (!) 118, temperature 98.3 F (36.8 C), temperature source Axillary, resp. rate 11, height 5\' 5"  (1.651 m), weight 133 kg, SpO2 99%.        Intake/Output Summary (Last 24 hours) at 09/25/2023 1103 Last data filed at 09/25/2023 0656 Gross per 24 hour  Intake 3134.45 ml  Output --  Net 3134.45 ml   Filed Weights   09/24/23 1211  Weight: 133 kg    Examination: Physical Exam Constitutional:      General: She is not in acute distress.    Appearance: She is obese. She is ill-appearing.  HENT:     Mouth/Throat:     Mouth: Mucous membranes are moist.  Cardiovascular:     Rate and Rhythm: Normal rate and regular rhythm.     Pulses: Normal pulses.     Heart sounds: Normal heart sounds.  Pulmonary:     Effort: Pulmonary effort is normal.     Breath sounds: Normal breath sounds. No wheezing or rales.  Musculoskeletal:     Comments: Bilateral lower extremity wounds  Neurological:     Mental Status: She is disoriented.      Assessment & Plan:  Neurology #Toxic Metabolic Encephalopathy #Opiate Intoxication  Presented with altered mental status for the last 2 to 3 days at her nursing facility in the setting of chronic opiate use, acute administration of IV narcotics in the hospital, AKI on CKD, Cefepime Use, and potentially DKA.  I suspect the toxic metabolic encephalopathy is multifactorial with opiates contributing but not completely  explaining alteration in mental status.  Other contributing etiologies include DKA as well as cefepime induced encephalopathy.  Her blood gas was checked and does not show significant acidosis (pH of 7.36 does not explain mental status).  Recently discharged with psychotropic meds that include IV cefepime, gabapentin, hydromorphone, and hydroxyzine.  CT of the head was negative and does not suggest any acute intracranial process.  We did administer 1 mg of IV naloxone with significant improvement in mental status though it does not appear that she returned to her previously described baseline. In addition to opiate intoxication, I am concerned she might be having cefepime induced encephalopathy given kidney injury as well as DKA.  -Recommend to hold psychotropic medications -Hold narcotics, redose naloxone as necessary -Recommend consulting with ID and switching away from cefepime -Consider MRI brain if mental status remains unchanged  Cardiovascular #Sepsis secondary to Osteomyelitis  Appears to be hemodynamically stable with repeat lactic acid showing downtrend and resolution of lactic acidosis.  Tachycardia likely induced by naloxone administration.  No signs of hypotension at this point.  Continue to monitor.  Pulmonary #Acute Hypoxic Respiratory Failure #Metabolic acidosis  Her hypoxia is driven by opiate intoxication with decreased respiratory drive and decreased tidal volume.  She is currently on oxygen support by nasal cannula with improvement.  Recommend to monitor for any signs of worsening hypoxia as this could suggest naloxone induced pulmonary edema.  She is compensating well for her metabolic acidosis.  Gastrointestinal  No active issues at this point, recommend nutritional consult given chronic wounds that are failing to heal.  Renal #AKI on CKD #Metabolic Acidosis  Has a history of CKD but is presenting with AKI and concomitant metabolic acidosis.  Differential for this  includes accumulated anions secondary to her kidney injury as well as DKA (ketones noted on UA).  Blood gas was obtained and showed a pH of 7.36 with CO2 of 30.  Will send repeat lactic acid and beta-hydroxybutyrate to further workup the etiology behind this acidosis.  -consider renal consult -check ketones, check lactic acid -avoid nephrotoxins as able  Endocrine #DKA #Insulin Dependent T2DM #Hypothyroidism  History of DKA in the past (May 2023) and she has insulin-dependent diabetes.  She could have euglycemic DKA which could complicate her mental status.  Will check with hydroxybutyrate.  Continue with insulin supplementation per primary team.  Continued on levothyroxine for her hypothyroidism per primary team.  -continue levothyroxine -check BHB  Hem/Onc  Will initiate heparin SubQ for DVT prophylaxis.  ID #Sepsis #Osteomyelitis #Sacral Decubitus Ulcers  Sepsis secondary to left fibular osteomyelitis.  Currently on broad-spectrum antibiotics per primary team.  She is on cefepime and daptomycin.  Would recommend considering alternatives to cefepime given encephalopathy.  -Consider ID consult -recommend switch away from Cefepime due to encephalopathy   Best Practice (right click and "Reselect all SmartList Selections" daily)   Diet/type: Regular consistency (see orders) DVT prophylaxis prophylactic heparin  Pressure ulcer(s): present on admission  GI prophylaxis: N/A Lines: N/A Foley:  N/A Code Status:  full code Last date of multidisciplinary goals of care discussion [09/25/2023]  Labs   CBC: Recent Labs  Lab 09/24/23 1214 09/25/23 0602  WBC 9.8 17.6*  NEUTROABS 6.3 16.1*  HGB 10.9* 10.1*  HCT 36.1 33.4*  MCV 90.3 92.0  PLT 415* 398    Basic Metabolic Panel: Recent Labs  Lab 09/24/23 1214 09/25/23 0602  NA 140 139  K 5.1 5.8*  CL 108 106  CO2 21* 19*  GLUCOSE 155* 212*  BUN 38* 42*  CREATININE 2.75* 2.85*  CALCIUM 8.8* 8.7*   GFR: Estimated  Creatinine Clearance: 25.3 mL/min (A) (by C-G formula based on SCr of 2.85 mg/dL (H)). Recent Labs  Lab 09/24/23 1214 09/24/23 1224 09/24/23 1439 09/25/23 0602  WBC 9.8  --   --  17.6*  LATICACIDVEN  --  2.7* 2.2*  --     Liver Function Tests: Recent Labs  Lab 09/24/23 1214 09/25/23 0602  AST 23 17  ALT 14 12  ALKPHOS 76 69  BILITOT 0.6 0.6  PROT 7.4 7.1  ALBUMIN 2.5* 2.2*   No results for input(s): "LIPASE", "AMYLASE" in the last 168 hours. No results for input(s): "AMMONIA" in the last 168 hours.  ABG    Component Value Date/Time   HCO3 18.4 (L) 09/24/2023 1214   ACIDBASEDEF 9.4 (H) 09/24/2023 1214   O2SAT PENDING 09/24/2023 1214     Coagulation Profile: Recent Labs  Lab 09/24/23 1214  INR 1.4*    Cardiac Enzymes: Recent Labs  Lab 09/25/23 0602  CKTOTAL 36*    HbA1C: Hgb A1c MFr Bld  Date/Time Value Ref Range Status  09/06/2023 05:36 AM 8.1 (H) 4.8 - 5.6 % Final    Comment:    (NOTE) Pre diabetes:          5.7%-6.4%  Diabetes:              >6.4%  Glycemic control for   <7.0% adults with diabetes   10/31/2021 04:36 AM 7.9 (H) 4.8 - 5.6 % Final    Comment:    (NOTE) Pre diabetes:          5.7%-6.4%  Diabetes:              >6.4%  Glycemic control for   <7.0% adults with diabetes     CBG: Recent Labs  Lab 09/24/23 1819 09/25/23 0759  GLUCAP 187* 195*    Review of Systems:   Unable to obtain  Past Medical History:  She,  has a past medical history of Diabetes mellitus without complication (HCC) (2010), Hypertension, Hypothyroidism, Kidney stone (11/2014), Lymphedema, and Multiple open wounds of lower leg.   Surgical History:   Past Surgical History:  Procedure Laterality Date   AMPUTATION Right 05/03/2021   Procedure: AMPUTATION RAY;  Surgeon: Rosetta Posner, DPM;  Location: [facility] ORS;  Service: Podiatry;  Laterality: Right;  RIght Big Toe and Right Fifth Toe   IRRIGATION AND DEBRIDEMENT FOOT Bilateral 05/03/2021   Procedure:  IRRIGATION AND DEBRIDEMENT FEET;  Surgeon: Rosetta Posner, DPM;  Location: [facility] ORS;  Service: Podiatry;  Laterality: Bilateral;   LOWER EXTREMITY ANGIOGRAPHY Right 04/27/2021   Procedure: Lower Extremity Angiography;  Surgeon: Renford Dills, MD;  Location: [facility];  Service: Cardiovascular;  Laterality: Right;   LOWER EXTREMITY ANGIOGRAPHY Left 04/30/2021   Procedure: Lower Extremity Angiography;  Surgeon: Annice Needy, MD;  Location: [facility];  Service: Cardiovascular;  Laterality: Left;   TOTAL HIP ARTHROPLASTY Right 05/28/2015   Procedure: TOTAL HIP ARTHROPLASTY ANTERIOR APPROACH;  Surgeon: Kennedy Bucker, MD;  Location: [facility] OR[facility];  Service: Orthopedics;  Laterality: Right;     Social History:   reports that she has never smoked. She has never used smokeless tobacco. She reports that she does not drink alcohol and does not use drugs.   Family History:  Her family history includes Cancer in her mother; Diabetes in her father.   Allergies No Known Allergies   Home Medications  Prior to Admission medications   Medication Sig Start Date End Date Taking? Authorizing Provider  acetaminophen (TYLENOL) 325 MG tablet Take 2 tablets (650 mg total) by mouth every 6 (six) hours as needed for mild pain (or Fever >/= 101). 05/18/21  Yes Wieting, Richard, MD  amLODipine (NORVASC) 5 MG tablet Take 5 mg by mouth daily.   Yes [provider]  ascorbic acid (VITAMIN C) 500 MG tablet Take 1 tablet (500 mg total) by mouth daily. 11/03/21  Yes Wieting, Richard, MD  ceFEPime (MAXIPIME) IVPB Inject 2 g into the vein every 12 (twelve) hours. Indication:  L leg (fibula) osteomyelitis Last Day of Therapy:  10/19/2023 Labs - Once weekly:  CBC/D, CMP, CPK, ESR and CRP Fax weekly lab results  promptly to [phone]  Method of administration: IV Push Method of administration may be changed at the discretion of facility and its pharmacy Please pull PIC at completion of IV  antibiotics Call [phone] with critical values or questions 09/15/23 10/20/23 Yes Esaw Grandchild A, DO  cloNIDine (CATAPRES) 0.3 MG tablet TAKE ONE TABLET BY MOUTH 3 TIMES DAILY. 10/22/20  Yes Duanne Limerick, MD  clopidogrel (PLAVIX) 75 MG tablet Take 1 tablet (75 mg total) by mouth daily with breakfast. 05/19/21  Yes Wieting, Richard, MD  daptomycin (CUBICIN) IVPB Inject 700 mg into the vein daily. Indication:  L leg (fibula) osteomyelitis Last Day of Therapy:  10/19/2023 Labs - Once weekly:  CBC/D, CMP, CPK, ESR and CRP Fax weekly lab results  promptly to [phone]  Method of administration: IV Push Method of administration may be changed at the discretion of facility and its pharmacy Please pull PIC at completion of IV antibiotics Call [phone] with critical values or questions 09/15/23 10/20/23 Yes Esaw Grandchild A, DO  diclofenac Sodium (VOLTAREN) 1 % GEL Apply 2 g topically 3 (three) times daily.   Yes [provider]  ferrous gluconate (FERGON) 324 MG tablet Take 324 mg by mouth daily with breakfast.   Yes [provider]  furosemide (LASIX) 20 MG tablet Take 1 tablet (20 mg total) by mouth 2 (two) times daily. 05/18/21  Yes Wieting, Richard, MD  gabapentin (NEURONTIN) 300 MG capsule Take 1 capsule (300 mg total) by mouth at bedtime. 09/15/23  Yes Esaw Grandchild A, DO  HYDROmorphone (DILAUDID) 2 MG tablet Take 0.5 tablets (1 mg total) by mouth every 4 (four) hours as needed for moderate pain (pain score 4-6). 09/15/23  Yes Esaw Grandchild A, DO  HYDROmorphone (DILAUDID) 2 MG tablet Take 1 tablet (2 mg total) by mouth every 12 (twelve) hours as needed (with dressing changes). 09/15/23  Yes Esaw Grandchild A, DO  hydrOXYzine (ATARAX) 10 MG tablet Take 10 mg by mouth every 8 (eight) hours as needed for itching.   Yes [provider]  insulin aspart (NOVOLOG) 100 UNIT/ML injection Inject 3 Units into the skin 3 (three) times daily with meals. 05/18/21  Yes  Wieting, Richard, MD  irbesartan (AVAPRO) 150 MG tablet Take 1 tablet (150 mg total) by mouth daily. 05/19/21  Yes Alford Highland, MD  LANTUS SOLOSTAR 100 UNIT/ML Solostar  Pen Inject 14 Units into the skin every 12 (twelve) hours. 09/15/23  Yes Pennie Banter, DO  levothyroxine (SYNTHROID) 200 MCG tablet Take 200 mcg by mouth daily before breakfast.   Yes [provider]  metoprolol succinate (TOPROL-XL) 25 MG 24 hr tablet Take 0.5 tablets (12.5 mg total) by mouth daily with breakfast. 05/19/21  Yes Wieting, Richard, MD  Multiple Vitamin (MULTIVITAMIN WITH MINERALS) TABS tablet Take 1 tablet by mouth daily. 11/04/21  Yes Alford Highland, MD  NYSTATIN powder Apply 1 application. topically 3 (three) times daily. 10/29/21  Yes [provider]  ondansetron (ZOFRAN-ODT) 4 MG disintegrating tablet Take 2 mg by mouth every 6 (six) hours as needed. 10/21/21  Yes [provider]  senna-docusate (SENOKOT-S) 8.6-50 MG tablet Take 2 tablets by mouth at bedtime.   Yes [provider]  zinc oxide 20 % ointment Apply 1 Application topically as needed for diaper changes.   Yes [provider]  blood glucose meter kit and supplies 1 each by Other route daily. ONE TOUCH ULTRA METER. Use as directed. E11.29 10/23/20   Duanne Limerick, MD  camphor-[facility]) lotion Apply topically as needed for itching. Patient not taking: Reported on 09/24/2023 09/15/23   Esaw Grandchild A, DO  gentamicin ointment (GARAMYCIN) 0.1 % Apply 1 Application topically daily. Patient not taking: Reported on 09/24/2023 08/30/23   [provider]  glucose blood test strip 1 each by Other route daily. ONE TOUCH ULTRA TEST STRIPS E11.29 10/26/20   Duanne Limerick, MD  Lancets [facility] ULTRASOFT) lancets 1 each by Other route daily. Use as instructed E11.29 10/23/20   Duanne Limerick, MD  Nutritional Supplements (,FEEDING SUPPLEMENT, PROSOURCE PLUS) liquid Take 30 mLs by mouth 3 (three) times  daily between meals. Patient not taking: Reported on 09/24/2023 11/03/21   Alford Highland, MD  Nystatin (GERHARDT'S BUTT CREAM) CREA Apply 1 Application topically daily. Patient not taking: Reported on 09/24/2023 09/15/23   Esaw Grandchild A, DO  Vitamin D, Ergocalciferol, (DRISDOL) 1.25 MG (50000 UNIT) CAPS capsule Take 50,000 Units by mouth every 7 (seven) days. Patient not taking: Reported on 09/24/2023    [provider]     Critical care time: 31 minutes    Raechel Chute, MD [facility] 09/25/2023 12:18 PM

## 2023-09-25 NOTE — Consult Note (Signed)
 WOC Nurse Consult Note: patient known to WOC team from previous admission; patient with longstanding ulcerations to lower legs r/t venous insufficiency; has known osteomyelitis L fibula; has been evaluated by orthopedic surgeon who does not recommend amputation at this time  Per H&P and ortho notes these wounds have not progressed and look largely the same as last admission  Reason for Consult: B leg wounds Wound type: full and partial thickness wounds r/t venous hypertension and peripheral vascular disease  Pressure Injury POA: NA  Measurement: see nursing flowsheet  Wound bed: all wounds appear largely pink and moist; scattered areas of dark eschar Significant circumferential wounds to bilateral lower extremity with both purulent and frank bloody output. On the left, there is an area where wound seems to be opening overlying the tib-fib, site of known osteomyelitis. Similar to the pictures in chart from prior admission   Drainage (amount, consistency, odor)  as above  Periwound: hyperkeratotic peeling skin  Dressing procedure/placement/frequency: Cleanse bilateral lower legs (intact skin and areas of ulceration) with Vashe wound cleanser [employer] [place] [phone]), do not rinse and allow to air dry. Apply Xeroform gauze [employer] [place] [phone]) to wound beds daily, cover with ABD pads and secure with Kerlix roll gauze wrapped starting just above toes and ending right below knees.  May secure entire dressing with Ace bandage wrapped in same fashion as Kerlix roll gauze which will also provide some light compression.   Per orthopedic note their recommendation is whirlpool versus unna boot.  Whirlpool not available as inpatient. Will have WOC nurse evaluate for unna boot placement 09/25/2023.   POC discussed with bedside nurse. WOC team will follow as above.   Thank you,    Priscella Mann MSN, RN-BC, [employer] [phone]

## 2023-09-25 NOTE — ED Notes (Signed)
 Lab called to collect blood work.

## 2023-09-25 NOTE — Progress Notes (Signed)
 [facility] Progress Note  Patient: [name]    GNF:[id number]  DOA: 09/24/2023     Date of Service: the patient was seen and examined on 09/25/2023  Chief Complaint  Patient presents with   Altered Mental Status   Brief hospital course:  [name] is a 68 y.o. female with medical history significant of chronic bilateral lower extremity wounds with recent diagnosis of left fibula osteomyelitis, HFrEF with last EF of 45%, chronic lymphedema, hypertension, type 2 diabetes, PAD, CKD stage IIIa, who presents to the ED due to altered mental status.   History obtained through chart review due to patient's altered mental status. Per facility staff at [facility], patient has become increasingly altered over the past 1 to 2 days and has been refusing to take her medications.  At baseline, she is alert and oriented x 4 with no underlying history of dementia.  No reports of fever.   Per chart review, patient was recently admitted on 09/04/2023 and was discovered to have a left fibula osteomyelitis.  Cultures grew Staph aureus and Providencia.  She was discharged on cefepime and daptomycin with plans to continue IV antibiotics through October 19, 2023.   ED course: On arrival to the ED, patient was normotensive at 117/104 with heart rate of 90.  She was saturating at 100% on room air.  She was afebrile at 98.3.  Initial workup notable for hemoglobin of 10.9, platelets 415, bicarb 21, BUN 38, creatinine 2.75, and GFR of 18.  Lactic acid 2.7.  Urinalysis with ketonuria, proteinuria, few bacteria.  CT head with no acute intracranial abnormalities.  Chest x-ray with low lung volumes.  Patient started on Rocephin, vancomycin, IV fluids and pain medication.  [facility] contacted for admission.   Assessment and Plan:  # Toxic metabolic encephalopathy possible due to cefepime Patient presented with altered mental status ABG reviewed, no CO2 retention. Avoid sedating medications PCCM  consulted, patient received 1 dose of Narcan with some improvement in the mental status Rule out DKA, encephalopathy could be due to cefepime toxicity ID consulted, recommended to stop daptomycin and cefepime, patient already treated for 12 weeks.  Leg wounds are chronic.    # Sepsis secondary to left fibular osteomyelitis MRI reviewed Patient was discharged on cefepime and daptomycin via PICC line to be continued through 10/19/2023 as per ID recommendation Seen by orthopedic surgery, recommended no surgical intervention Continue wound care Continue current antibiotics Stoppod antibiotics as per ID  # AKI on CKD stage IIIb Baseline creatinine range 1.1-1.6 Creatinine 2.75 on admission Continue IV fluid for hydration Monitor urine output, avoid nephrotoxic medications 3/31 Foley catheter insertion to monitor intake and output 3/31 creatinine 2.85 elevated   # Hyperkalemia due to renal failure Accident order placed per patient is unable to take oral due to AMS Ordered D10, insulin 5 units and albuterol nebulizer Repeat BMP Continue to monitor on telemetry   # Metabolic acidosis due to renal failure Bicarbonate 50 mEq one-time dose given Monitor electrolytes   # IDDM T2  Continue NovoLog sliding scale Monitor CBG Start diabetic diet when patient is able to swallow Held gabapentin due to altered mental status   # Chronic systolic CHF and hypertension, PAD Held home medications amlodipine 5 mg, Lasix 20 mg BID, Toprol-XL 12.5 mg, irbesartan 150 mg, clonidine 0.3 mg p.o. 3 times daily, Plavix 75 mg, Held home meds due to altered mental status and sepsis Monitor BP and resume medications as per improvement  #  Hypothyroid, resume Synthroid home dose   Body mass index is 48.81 kg/m.  Interventions:  Pressure Injury 04/17/21 Sacrum Medial Stage 2 -  Partial thickness loss of dermis presenting as a shallow open injury with a red, pink wound bed without slough. (Active)   04/17/21 0444  Location: Sacrum  Location Orientation: Medial  Staging: Stage 2 -  Partial thickness loss of dermis presenting as a shallow open injury with a red, pink wound bed without slough.  Wound Description (Comments):   Present on Admission: Yes     Pressure Injury 04/17/21 Heel Left;Posterior Unstageable - Full thickness tissue loss in which the base of the injury is covered by slough (yellow, tan, gray, green or brown) and/or eschar (tan, brown or black) in the wound bed. (Active)  04/17/21   Location: Heel  Location Orientation: Left;Posterior  Staging: Unstageable - Full thickness tissue loss in which the base of the injury is covered by slough (yellow, tan, gray, green or brown) and/or eschar (tan, brown or black) in the wound bed.  Wound Description (Comments):   Present on Admission: Yes     Diet: Carb modified diet DVT Prophylaxis: Subcutaneous Heparin    Advance goals of care discussion: Full code  Family Communication: family was not present at bedside, at the time of interview.  The pt provided permission to discuss medical plan with the family. Opportunity was given to ask question and all questions were answered satisfactorily.   Disposition:  Pt is from SNF, admitted with AMS due to cefepime toxicity, still has AMS, which precludes a safe discharge. Discharge to SNF, when stable, may need few days to improve.  Subjective: Since last night patient has been altered, in the morning time patient was breathing slow, unable to respond and follow any commands.  Very lethargic  Physical Exam: General: Lethargic and very somnolent/obtunded.   Eyes: PERRLA ENT: Oral Mucosa Clear, but dry Neck: no JVD,  Cardiovascular: S1 and S2 Present, no Murmur,  Respiratory: Equal air entry bilaterally, decreased breath sounds due to altered mental status.  No significant crackles or wheezes Abdomen: Bowel Sound present, Soft and no tenderness,  Skin: Bilateral chronic leg  wounds, wrapped with dressing Extremities: 4+ Pedal edema, no calf tenderness Neurologic: Obtunded, very lethargic, not responsive, unable to follow commands Gait not checked due to patient safety concerns  Vitals:   09/25/23 0500 09/25/23 0600 09/25/23 0900 09/25/23 1035  BP: (!) 97/56 130/86 135/70   Pulse: 89 (!) 107 (!) 118   Resp: (!) 9 (!) 9 11   Temp:    98.3 F (36.8 C)  TempSrc:    Axillary  SpO2: 99% 100% 99%   Weight:      Height:        Intake/Output Summary (Last 24 hours) at 09/25/2023 1321 Last data filed at 09/25/2023 4098 Gross per 24 hour  Intake 3034.45 ml  Output --  Net 3034.45 ml   Filed Weights   09/24/23 1211  Weight: 133 kg    Data Reviewed: I have personally reviewed and interpreted daily labs, tele strips, imagings as discussed above. I reviewed all nursing notes, pharmacy notes, vitals, pertinent old records I have discussed plan of care as described above with RN and patient/family.  CBC: Recent Labs  Lab 09/24/23 1214 09/25/23 0602  WBC 9.8 17.6*  NEUTROABS 6.3 16.1*  HGB 10.9* 10.1*  HCT 36.1 33.4*  MCV 90.3 92.0  PLT 415* 398   Basic Metabolic Panel: Recent Labs  Lab  09/24/23 1214 09/25/23 0602  NA 140 139  K 5.1 5.8*  CL 108 106  CO2 21* 19*  GLUCOSE 155* 212*  BUN 38* 42*  CREATININE 2.75* 2.85*  CALCIUM 8.8* 8.7*    Studies: CT Head Wo Contrast Result Date: 09/24/2023 CLINICAL DATA:  Mental status change, unknown cause EXAM: CT HEAD WITHOUT CONTRAST TECHNIQUE: Contiguous axial images were obtained from the base of the skull through the vertex without intravenous contrast. RADIATION DOSE REDUCTION: This exam was performed according to the departmental dose-optimization program which includes automated exposure control, adjustment of the mA and/or kV according to patient size and/or use of iterative reconstruction technique. COMPARISON:  02/20/2012 FINDINGS: Brain: No evidence of acute infarction, hemorrhage,  hydrocephalus, extra-axial collection or mass lesion/mass effect. Scattered low-density changes within the periventricular and subcortical white matter most compatible with chronic microvascular ischemic change. Mild diffuse cerebral volume loss. Vascular: Atherosclerotic calcifications involving the large vessels of the skull base. No unexpected hyperdense vessel. Skull: Normal. Negative for fracture or focal lesion. Sinuses/Orbits: No acute finding. Other: None. IMPRESSION: 1. No acute intracranial findings. 2. Chronic microvascular ischemic change and cerebral volume loss. Electronically Signed   By: Duanne Guess D.O.   On: 09/24/2023 13:55    Scheduled Meds:  heparin injection (subcutaneous)  5,000 Units Subcutaneous Q8H   insulin aspart  0-15 Units Subcutaneous TID WC   sodium chloride flush  3 mL Intravenous Q12H   sodium zirconium cyclosilicate  10 g Oral TID   Continuous Infusions:  acetaminophen     ceFEPime (MAXIPIME) IV Stopped (09/25/23 0901)   [START ON 09/26/2023] DAPTOmycin     PRN Meds: acetaminophen, HYDROmorphone (DILAUDID) injection, ondansetron (ZOFRAN) IV  Time spent: 55 minutes  Author: Gillis Santa. MD [facility] 09/25/2023 1:21 PM  To reach On-call, see care teams to locate the attending and reach out to them via [email or web address]. If 7PM-7AM, please contact night-coverage If you still have difficulty reaching the attending provider, please page the [facility] (Director on Call) for [facility] on amion for assistance.

## 2023-09-25 NOTE — Plan of Care (Signed)
       CROSS COVER NOTE  NAME: [name] MRN: [id number] DOB : July 26, 1955    Concern as stated by nurse / staff   pt HR is staying in the 125-130s. no prn to give.    pt new admit HR been elevated since arrival in er., she is not on any of those medications. b/p is 150/60 pt is not agitated. laying in bed with eyes open and no response to verbal commands.      Pertinent findings on chart review: Patient admitted on 3/30 with sepsis secondary to left fibula osteomyelitis toxic metabolic encephalopathy renal attributed to daptomycin and cefepime both of which have been discontinued on ID recommendation Has been tachycardic since admission, and is currently on IV hydration at 125 mL/h.  Rate noted to be widely fluctuating 100s now in the 120s to 130s was originally in the 150s on arrival  Most recent TSH on 3/12 was normal at 1.3   Current vitals and most recent labs    09/25/2023    7:51 PM 09/25/2023    4:50 PM 09/25/2023   11:00 AM  Vitals with BMI  Systolic 109 141 578  Diastolic 53 78 100  Pulse 126 469 132   Lactic Acid, Venous    Component Value Date/Time   LATICACIDVEN 3.4 (HH) 09/25/2023 1618   Lactic acid without meaningful improvement 2.7->2.2->3.6->3.4     Latest Ref Rng & Units 09/25/2023    1:51 PM 09/25/2023    6:02 AM 09/24/2023   12:14 PM  BMP  Glucose 70 - 99 mg/dL 629  528  413   BUN 8 - 23 mg/dL 45  42  38   Creatinine 0.44 - 1.00 mg/dL 2.44  0.10  2.72   Sodium 135 - 145 mmol/L 140  139  140   Potassium 3.5 - 5.1 mmol/L 4.9  5.8  5.1   Chloride 98 - 111 mmol/L 107  106  108   CO2 22 - 32 mmol/L 17  19  21    Calcium 8.9 - 10.3 mg/dL 8.7  8.7  8.8      Assessment and  Interventions   Assessment:  -Tachycardia, persistent in the setting of adequately treated sepsis -Elevated lactic acid--possibly related to sepsis versus acidosis from worsening renal function -Concern for worsening sepsis  Plan: Will get sepsis labs, with lactic acid and  repeat renal function Continue IV fluid resuscitation Further management based on results

## 2023-09-25 NOTE — Progress Notes (Addendum)
 Brief Progress Note:  Called by patient's team regarding altered mental status, depressed mentation. Reviewed medical record, patient admitted with sepsis secondary to osteomyelitis, AKI, AMS. CT head yesterday did not show any bleed or acute changes. Reviewed medications, has received narcotics overnight.  On exam, pupils pinpoint, patient's respirations are depressed, minimally arouses to pain. Administered 1 mg of IV narcan at the bedside with brisk improvement in mental status. She is awaiting blood gas check to rule out hypercapnia.  No critical care needs at this point, she is protecting airway. Would re-dose with narcan PRN with change in mentation.  Please re-consult [name] with any clinical change.  Consult note to follow.  Raechel Chute, MD [facility] 09/25/2023 11:03 AM

## 2023-09-25 NOTE — Consult Note (Addendum)
 WOC Nurse Consult Note: Reason for Consult:Nonhealing nonintact lower leg wounds in the setting of peripheral artery disease, phlebolymphedema and diabetes.  Last ABI on file was in 2022 and left leg was noncompressible .  Last visit to wound care center recommended ace wraps for compression.  Noncompliance with Juxtalite compression.  Edema noted to feet.  MIxed venous and arterial insufficiency.     Wound type:Mixed venous and arterial insuffiiciency, chronic nonhealing  Pressure Injury POA: NA Measurement: generalized nonintact lesions to lower legs, chronic  Wound bed: pale pink nongranulating with scattered scabs.   Drainage (amount, consistency, odor) minimal serosanguinous   Periwound: edema to feet Dressing procedure/placement/frequency: ED and/or bedside nurse to complete Monday, Wednesday and Friday. Cleanse bilateral legs with VASHE and air dry.  Apply Xeroform  to open wounds, wrap with kerlix and secure with ace wrap for modified light compression.  Will not follow at this time.  Please re-consult if needed.  Mike Gip MSN, RN, FNP-BC CWON Wound, Ostomy, Continence Nurse Outpatient [facility] [phone] Pager [phone]

## 2023-09-25 NOTE — Inpatient Diabetes Management (Signed)
 Inpatient Diabetes Program Recommendations  AACE/ADA: New Consensus Statement on Inpatient Glycemic Control (2015)  Target Ranges:  Prepandial:   less than 140 mg/dL      Peak postprandial:   less than 180 mg/dL (1-2 hours)      Critically ill patients:  140 - 180 mg/dL    Latest Reference Range & Units 09/25/23 07:59 09/25/23 11:52 09/25/23 12:29  Glucose-Capillary 70 - 99 mg/dL 161 (H)  3 units Novolog  266 (H)  5 units Novolog  259 (H)  8 units Novolog   (H): Data is abnormally high    Home DM Meds: Lantus 14 units BID Novolog 3 units TID   Current Orders: Novolog Moderate Correction Scale/ SSI (0-15 units) TID AC    MD- Note pt takes Lantus insulin at home.  Please consider starting Semglee 10 units BID (75% total home dose)    --Will follow patient during hospitalization--  Ambrose Finland RN, MSN, CDCES Diabetes Coordinator Inpatient Glycemic Control Team Team Pager: [phone] (8a-5p)

## 2023-09-25 NOTE — ED Notes (Signed)
 Pt bladder scanned. 0 mL  [name], MD, made aware

## 2023-09-26 ENCOUNTER — Encounter: Payer: Self-pay | Admitting: Internal Medicine

## 2023-09-26 ENCOUNTER — Inpatient Hospital Stay

## 2023-09-26 DIAGNOSIS — R4182 Altered mental status, unspecified: Secondary | ICD-10-CM | POA: Diagnosis not present

## 2023-09-26 DIAGNOSIS — G9341 Metabolic encephalopathy: Secondary | ICD-10-CM | POA: Diagnosis not present

## 2023-09-26 DIAGNOSIS — R6520 Severe sepsis without septic shock: Secondary | ICD-10-CM | POA: Diagnosis not present

## 2023-09-26 DIAGNOSIS — G253 Myoclonus: Secondary | ICD-10-CM | POA: Diagnosis not present

## 2023-09-26 DIAGNOSIS — M86162 Other acute osteomyelitis, left tibia and fibula: Secondary | ICD-10-CM | POA: Diagnosis not present

## 2023-09-26 DIAGNOSIS — N179 Acute kidney failure, unspecified: Secondary | ICD-10-CM | POA: Diagnosis not present

## 2023-09-26 DIAGNOSIS — N189 Chronic kidney disease, unspecified: Secondary | ICD-10-CM | POA: Diagnosis not present

## 2023-09-26 DIAGNOSIS — A419 Sepsis, unspecified organism: Secondary | ICD-10-CM | POA: Diagnosis not present

## 2023-09-26 DIAGNOSIS — I509 Heart failure, unspecified: Secondary | ICD-10-CM

## 2023-09-26 DIAGNOSIS — Z515 Encounter for palliative care: Secondary | ICD-10-CM

## 2023-09-26 HISTORY — DX: Heart failure, unspecified: I50.9

## 2023-09-26 LAB — CBC
HCT: 31.6 % — ABNORMAL LOW (ref 36.0–46.0)
Hemoglobin: 9.8 g/dL — ABNORMAL LOW (ref 12.0–15.0)
MCH: 27.7 pg (ref 26.0–34.0)
MCHC: 31 g/dL (ref 30.0–36.0)
MCV: 89.3 fL (ref 80.0–100.0)
Platelets: 364 10*3/uL (ref 150–400)
RBC: 3.54 MIL/uL — ABNORMAL LOW (ref 3.87–5.11)
RDW: 19.3 % — ABNORMAL HIGH (ref 11.5–15.5)
WBC: 11.9 10*3/uL — ABNORMAL HIGH (ref 4.0–10.5)
nRBC: 0 % (ref 0.0–0.2)

## 2023-09-26 LAB — BASIC METABOLIC PANEL WITH GFR
Anion gap: 12 (ref 5–15)
BUN: 45 mg/dL — ABNORMAL HIGH (ref 8–23)
CO2: 19 mmol/L — ABNORMAL LOW (ref 22–32)
Calcium: 8.4 mg/dL — ABNORMAL LOW (ref 8.9–10.3)
Chloride: 110 mmol/L (ref 98–111)
Creatinine, Ser: 3.33 mg/dL — ABNORMAL HIGH (ref 0.44–1.00)
GFR, Estimated: 14 mL/min — ABNORMAL LOW (ref >60)
Glucose, Bld: 170 mg/dL — ABNORMAL HIGH (ref 70–99)
Potassium: 4.4 mmol/L (ref 3.5–5.1)
Sodium: 141 mmol/L (ref 135–145)

## 2023-09-26 LAB — COMPREHENSIVE METABOLIC PANEL WITH GFR
ALT: 11 U/L (ref 0–44)
AST: 19 U/L (ref 15–41)
Albumin: 2.2 g/dL — ABNORMAL LOW (ref 3.5–5.0)
Alkaline Phosphatase: 62 U/L (ref 38–126)
Anion gap: 12 (ref 5–15)
BUN: 45 mg/dL — ABNORMAL HIGH (ref 8–23)
CO2: 19 mmol/L — ABNORMAL LOW (ref 22–32)
Calcium: 8.5 mg/dL — ABNORMAL LOW (ref 8.9–10.3)
Chloride: 109 mmol/L (ref 98–111)
Creatinine, Ser: 3.29 mg/dL — ABNORMAL HIGH (ref 0.44–1.00)
GFR, Estimated: 15 mL/min — ABNORMAL LOW (ref >60)
Glucose, Bld: 181 mg/dL — ABNORMAL HIGH (ref 70–99)
Potassium: 4.6 mmol/L (ref 3.5–5.1)
Sodium: 140 mmol/L (ref 135–145)
Total Bilirubin: 0.7 mg/dL (ref 0.0–1.2)
Total Protein: 6.6 g/dL (ref 6.5–8.1)

## 2023-09-26 LAB — GLUCOSE, CAPILLARY
Glucose-Capillary: 160 mg/dL — ABNORMAL HIGH (ref 70–99)
Glucose-Capillary: 108 mg/dL — ABNORMAL HIGH (ref 70–99)
Glucose-Capillary: 144 mg/dL — ABNORMAL HIGH (ref 70–99)
Glucose-Capillary: 145 mg/dL — ABNORMAL HIGH (ref 70–99)

## 2023-09-26 LAB — MAGNESIUM: Magnesium: 2 mg/dL (ref 1.7–2.4)

## 2023-09-26 LAB — BRAIN NATRIURETIC PEPTIDE: B Natriuretic Peptide: 120.5 pg/mL — ABNORMAL HIGH (ref 0.0–100.0)

## 2023-09-26 LAB — BLOOD GAS, ARTERIAL
Acid-base deficit: 3.8 mmol/L — ABNORMAL HIGH (ref 0.0–2.0)
Bicarbonate: 20.7 mmol/L (ref 20.0–28.0)
O2 Content: 2 L/min
O2 Saturation: 98.8 %
Patient temperature: 37
pCO2 arterial: 35 mmHg (ref 32–48)
pH, Arterial: 7.38 (ref 7.35–7.45)
pO2, Arterial: 103 mmHg (ref 83–108)

## 2023-09-26 LAB — LACTIC ACID, PLASMA: Lactic Acid, Venous: 1.2 mmol/L (ref 0.5–1.9)

## 2023-09-26 LAB — PHOSPHORUS: Phosphorus: 5.6 mg/dL — ABNORMAL HIGH (ref 2.5–4.6)

## 2023-09-26 LAB — TSH: TSH: 0.109 u[IU]/mL — ABNORMAL LOW (ref 0.350–4.500)

## 2023-09-26 LAB — TROPONIN I (HIGH SENSITIVITY)
Troponin I (High Sensitivity): 19 ng/L — ABNORMAL HIGH (ref <18)
Troponin I (High Sensitivity): 21 ng/L — ABNORMAL HIGH (ref <18)

## 2023-09-26 LAB — PROCALCITONIN: Procalcitonin: 0.97 ng/mL

## 2023-09-26 LAB — T4, FREE: Free T4: 0.67 ng/dL (ref 0.61–1.12)

## 2023-09-26 MED ORDER — ORAL CARE MOUTH RINSE: 15.0000 mL | OROMUCOSAL | Status: DC | PRN

## 2023-09-26 MED ORDER — ACETAMINOPHEN 650 MG RE SUPP: 650.0000 mg | Freq: Four times a day (QID) | RECTAL | Status: DC | PRN

## 2023-09-26 MED ORDER — ACETAMINOPHEN 325 MG PO TABS
650.0000 mg | ORAL_TABLET | Freq: Four times a day (QID) | ORAL | Status: DC | PRN
Start: 2023-09-26 — End: 2023-09-28

## 2023-09-26 MED ORDER — LEVOTHYROXINE SODIUM 100 MCG PO TABS
100.0000 ug | ORAL_TABLET | Freq: Every day | ORAL | Status: DC
  Administered 2023-09-27 – 2023-10-02 (×4): 100 ug via ORAL
  Filled 2023-09-26 (×4): qty 1

## 2023-09-26 MED ORDER — FUROSEMIDE 10 MG/ML IJ SOLN
20.0000 mg | Freq: Once | INTRAMUSCULAR | Status: AC
Start: 2023-09-26 — End: 2023-09-26
  Administered 2023-09-26: 20 mg via INTRAVENOUS
  Filled 2023-09-26: qty 2

## 2023-09-26 MED ORDER — CHLORHEXIDINE GLUCONATE CLOTH 2 % EX PADS
6.0000 | MEDICATED_PAD | Freq: Every day | CUTANEOUS | Status: DC
  Administered 2023-09-26 – 2023-10-02 (×7): 6 via TOPICAL

## 2023-09-26 MED ORDER — ENSURE ENLIVE PO LIQD: 237.0000 mL | Freq: Two times a day (BID) | ORAL | Status: DC

## 2023-09-26 MED ORDER — SODIUM CHLORIDE 0.9 % IV SOLN: INTRAVENOUS | Status: AC

## 2023-09-26 MED ORDER — NALOXONE HCL 0.4 MG/ML IJ SOLN
0.4000 mg | Freq: Once | INTRAMUSCULAR | Status: AC
  Administered 2023-09-26: 0.4 mg via INTRAVENOUS
  Filled 2023-09-26: qty 1

## 2023-09-26 MED ORDER — DILTIAZEM HCL 25 MG/5ML IV SOLN
10.0000 mg | Freq: Once | INTRAVENOUS | Status: AC
  Administered 2023-09-26: 10 mg via INTRAVENOUS
  Filled 2023-09-26: qty 5

## 2023-09-26 MED ORDER — ORAL CARE MOUTH RINSE
15.0000 mL | OROMUCOSAL | Status: DC
  Administered 2023-09-26 – 2023-10-02 (×25): 15 mL via OROMUCOSAL

## 2023-09-26 MED ORDER — SODIUM BICARBONATE 8.4 % IV SOLN
50.0000 meq | Freq: Once | INTRAVENOUS | Status: AC
  Administered 2023-09-26: 50 meq via INTRAVENOUS
  Filled 2023-09-26: qty 50

## 2023-09-26 MED ORDER — METOPROLOL TARTRATE 5 MG/5ML IV SOLN
5.0000 mg | Freq: Once | INTRAVENOUS | Status: AC
  Administered 2023-09-26: 5 mg via INTRAVENOUS
  Filled 2023-09-26: qty 5

## 2023-09-26 MED ORDER — METOPROLOL TARTRATE 5 MG/5ML IV SOLN
5.0000 mg | Freq: Three times a day (TID) | INTRAVENOUS | Status: DC
  Administered 2023-09-26 – 2023-09-27 (×4): 5 mg via INTRAVENOUS
  Filled 2023-09-26 (×4): qty 5

## 2023-09-26 NOTE — Progress Notes (Addendum)
 Date of Admission:  09/24/2023      ID: [name] is a 68 y.o. female Principal Problem:   Sepsis (HCC) Active Problems:   Essential hypertension   Hypothyroidism   Acute kidney injury superimposed on CKD (HCC)   PVD (peripheral vascular disease) (HCC)   Type II diabetes mellitus with renal manifestations (HCC)   Chronic systolic CHF (congestive heart failure) (HCC)   Osteomyelitis of left fibula (HCC)   Acute encephalopathy   Acute congestive heart failure (HCC)    Subjective: Pt remains obtunded But no myoclonic jerks noticed today Also appears restful, no moaning  Medications:   Chlorhexidine Gluconate Cloth  6 each Topical Daily   feeding supplement  237 mL Oral BID BM   heparin injection (subcutaneous)  5,000 Units Subcutaneous Q8H   insulin aspart  0-15 Units Subcutaneous TID WC   insulin glargine-yfgn  15 Units Subcutaneous Daily   [START ON 09/27/2023] levothyroxine  100 mcg Oral Q0600   metoprolol tartrate  5 mg Intravenous Q8H   mouth rinse  15 mL Mouth Rinse 4 times per day   sodium chloride flush  3 mL Intravenous Q12H   sodium zirconium cyclosilicate  10 g Oral Once    Objective: Vital signs in last 24 hours: Patient Vitals for the past 24 hrs:  BP Temp Temp src Pulse Resp SpO2 Weight  09/26/23 1430 (!) 142/72 -- -- 93 15 97 % --  09/26/23 1400 137/61 -- -- 99 15 97 % --  09/26/23 1330 (!) 154/61 -- -- (!) 111 19 97 % --  09/26/23 1315 (!) 150/57 -- -- 96 19 97 % --  09/26/23 1300 (!) 147/66 -- -- (!) 102 18 97 % --  09/26/23 1247 (!) 146/59 -- -- (!) 153 18 97 % --  09/26/23 1245 (!) 146/59 -- -- (!) 107 16 98 % --  09/26/23 1230 (!) 136/58 99.2 F (37.3 C) Axillary (!) 111 16 98 % --  09/26/23 1215 (!) 146/55 -- -- (!) 111 16 98 % --  09/26/23 1200 134/62 -- -- (!) 117 18 97 % --  09/26/23 1145 (!) 146/61 -- -- (!) 112 15 97 % --  09/26/23 1130 (!) 149/61 -- -- (!) 125 17 97 % --  09/26/23 1128 (!) 149/61 -- -- (!) 123 15 97 % --  09/26/23  1115 (!) 140/66 -- -- (!) 112 17 97 % --  09/26/23 1100 (!) 148/64 -- -- (!) 109 15 98 % --  09/26/23 1045 (!) 145/63 -- -- (!) 117 14 98 % --  09/26/23 1030 (!) 140/60 -- -- (!) 107 14 98 % --  09/26/23 1015 (!) 143/58 -- -- (!) 101 14 98 % --  09/26/23 1000 (!) 132/58 98.2 F (36.8 C) Oral (!) 116 15 98 % --  09/26/23 0945 (!) 87/67 -- -- (!) 104 13 98 % --  09/26/23 0930 (!) 147/74 -- -- (!) 108 14 98 % --  09/26/23 0915 (!) 145/72 -- -- (!) 117 16 98 % --  09/26/23 0900 (!) 146/70 98 F (36.7 C) -- (!) 104 17 98 % --  09/26/23 0845 (!) 161/59 -- -- (!) 117 14 98 % --  09/26/23 0830 (!) 155/69 -- -- (!) 136 14 98 % --  09/26/23 0825 (!) 150/68 -- -- (!) 139 (!) 24 98 % --  09/26/23 0813 -- 98 F (36.7 C) -- -- -- -- --  09/26/23 0800 (!) 155/120 98.6 F (  37 C) Axillary (!) 133 12 98 % --  09/26/23 0700 (!) 148/59 -- -- (!) 120 14 98 % --  09/26/23 0500 -- -- -- -- -- -- 129.5 kg  09/26/23 0300 (!) 151/64 99 F (37.2 C) -- (!) 130 15 97 % --  09/25/23 2251 138/66 (P) 98.5 F (36.9 C) (P) Axillary (!) 132 15 98 % --  09/25/23 2033 -- 97.7 F (36.5 C) Axillary -- -- -- --  09/25/23 1951 (!) 109/53 -- -- (!) 126 12 99 % --  09/25/23 1650 (!) 141/78 -- -- (!) 134 14 98 % --  09/25/23 1648 -- (!) 97.5 F (36.4 C) Axillary -- -- -- --      PHYSICAL EXAM:  General: obtunded No distress  PERL Neck: Supple, symmetrical, no adenopathy, thyroid: non tender no carotid bruit and no JVD. Back: No CVA tenderness. Lungs: Clear to auscultation bilaterally. No Wheezing or Rhonchi. No rales. Heart: Regular rate and rhythm, no murmur, rub or gallop. Abdomen: Soft, non-tender,not distended. Bowel sounds normal. No masses Extremities: b/l leg wounds Skin: No rashes or lesions. Or bruising Lymph: Cervical, supraclavicular normal. Neurologic: cannot assess  Lab Results    Latest Ref Rng & Units 09/26/2023    1:25 AM 09/25/2023   11:28 PM 09/25/2023    6:02 AM  CBC  WBC 4.0 - 10.5 K/uL  11.9  12.6  17.6   Hemoglobin 12.0 - 15.0 g/dL 9.8  9.3  16.1   Hematocrit 36.0 - 46.0 % 31.6  30.5  33.4   Platelets 150 - 400 K/uL 364  361  398        Latest Ref Rng & Units 09/26/2023    1:25 AM 09/25/2023   11:28 PM 09/25/2023    1:51 PM  CMP  Glucose 70 - 99 mg/dL 096  045  409   BUN 8 - 23 mg/dL 45  45  45   Creatinine 0.44 - 1.00 mg/dL 8.11  9.14  7.82   Sodium 135 - 145 mmol/L 141  140  140   Potassium 3.5 - 5.1 mmol/L 4.4  4.6  4.9   Chloride 98 - 111 mmol/L 110  109  107   CO2 22 - 32 mmol/L 19  19  17    Calcium 8.9 - 10.3 mg/dL 8.4  8.5  8.7   Total Protein 6.5 - 8.1 g/dL  6.6    Total Bilirubin 0.0 - 1.2 mg/dL  0.7    Alkaline Phos 38 - 126 U/L  62    AST 15 - 41 U/L  19    ALT 0 - 44 U/L  11        Microbiology: [facility] Ng  Studies/Results: DG Chest Port 1 View Result Date: 09/25/2023 CLINICAL DATA:  Sepsis EXAM: PORTABLE CHEST 1 VIEW COMPARISON:  09/24/2023 FINDINGS: Right PICC line remains in place, unchanged. Heart and mediastinal contours are within normal limits. Low lung volumes with bibasilar atelectasis and vascular congestion. No effusions. No acute bony abnormality. IMPRESSION: Low lung volumes with bibasilar atelectasis and vascular congestion. Electronically Signed   By: Charlett Nose M.D.   On: 09/25/2023 23:14     Assessment/Plan: 68 y.o. 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 She presents from SNF with altered mental status and not eating and drinking for a few days? ? ?   Acute metabolic encephalopathy- due to AKI on CKD and CNS effect of opioids and cefepime causing encephalopathy, myoclonus. She received narcan with not much of a  change I doubt this is sepsis especially with her being on broad spectrum antibiotics Rather will hold antibiotics till her mental status clears She may not need Iv antibiotics for the osteo as it is unlikely to heal because of her immobility and pressure wounds May need MRI and EEG to make sure no CVA, seizure Discontinue all opioids   AKI on CKD- worsening   Paroxysmal afib   Obesity   Bed bound status    B/l venous wounds legs  Hypothyroidism     Discussed the management with hospitalist and intensivist

## 2023-09-26 NOTE — Progress Notes (Signed)
   09/26/23 0800  Assess: MEWS Score  Temp 98.6 F (37 C)  BP (!) 155/120  MAP (mmHg) 130  Pulse Rate (!) 133  ECG Heart Rate (!) 127  Resp 12  Level of Consciousness Responds to Pain  SpO2 98 %  O2 Device Nasal Cannula  Patient Activity (if Appropriate) In bed  O2 Flow Rate (L/min) 2 L/min  Assess: MEWS Score  MEWS Temp 0  MEWS Systolic 0  MEWS Pulse 2  MEWS RR 1  MEWS LOC 2  MEWS Score 5  MEWS Score Color Red  Assess: if the MEWS score is Yellow or Red  Were vital signs accurate and taken at a resting state? Yes  Does the patient meet 2 or more of the SIRS criteria? No  MEWS guidelines implemented  Yes, red  Treat  MEWS Interventions Considered administering scheduled or prn medications/treatments as ordered  Take Vital Signs  Increase Vital Sign Frequency  Red: Q1hr x2, continue Q4hrs until patient remains green for 12hrs  Escalate  MEWS: Escalate Red: Discuss with charge nurse and notify provider. Consider notifying RRT. If remains red for 2 hours consider need for higher level of care  Notify: Charge Nurse/RN  Name of Charge Nurse/RN Notified Metro Kung, RN  Provider Notification  Provider Name/Title Dr. [name]  Date Provider Notified 09/26/23  Time Provider Notified [phone]  Method of Notification Page  Notification Reason Other (Comment) (Red MEWS)  Provider response See new orders  Date of Provider Response 09/26/23  Time of Provider Response [phone]  Assess: SIRS CRITERIA  SIRS Temperature  0  SIRS Respirations  0  SIRS Pulse 1  SIRS WBC 0  SIRS Score Sum  1   Notified Dr. [name] that patient is a Red MEWS, has eyes open but only responds to painful stimuli. Heart rate on monitor 130s and appears to be going in and out Afib. MD ordered Cardizem push once. Prior to Cardizem being given EKG obtained x2. One showed Sinus tach with PVC and the second showed Afib RVR. Notified MD of EKG results and he placed further orders.

## 2023-09-26 NOTE — Progress Notes (Signed)
 Notified Dr. [name] that after narcan given patient started moaning more frequently. Does not respond to commands. Withdraws from pain only but also moves arms at times without stimulation. MD acknowledged and stated he was placing orders.

## 2023-09-26 NOTE — Progress Notes (Signed)
 Dobbhoff placed and KUB obtained. Waiting results. Notified Lillia Abed, RN charge nurse and Efraim Kaufmann, RN to look for xray results to verify placement. Wire remains in place until xray confirmation. Both RN's acknowledged.

## 2023-09-26 NOTE — Progress Notes (Addendum)
 [facility] Progress Note  Patient: [name]    NWG:[id number]  DOA: 09/24/2023     Date of Service: the patient was seen and examined on 09/26/2023  Chief Complaint  Patient presents with   Altered Mental Status   Brief hospital course:  [name] is a 68 y.o. female with medical history significant of chronic bilateral lower extremity wounds with recent diagnosis of left fibula osteomyelitis, HFrEF with last EF of 45%, chronic lymphedema, hypertension, type 2 diabetes, PAD, CKD stage IIIa, who presents to the ED due to altered mental status.   History obtained through chart review due to patient's altered mental status. Per facility staff at [facility], patient has become increasingly altered over the past 1 to 2 days and has been refusing to take her medications.  At baseline, she is alert and oriented x 4 with no underlying history of dementia.  No reports of fever.   Per chart review, patient was recently admitted on 09/04/2023 and was discovered to have a left fibula osteomyelitis.  Cultures grew Staph aureus and Providencia.  She was discharged on cefepime and daptomycin with plans to continue IV antibiotics through October 19, 2023.   ED course: On arrival to the ED, patient was normotensive at 117/104 with heart rate of 90.  She was saturating at 100% on room air.  She was afebrile at 98.3.  Initial workup notable for hemoglobin of 10.9, platelets 415, bicarb 21, BUN 38, creatinine 2.75, and GFR of 18.  Lactic acid 2.7.  Urinalysis with ketonuria, proteinuria, few bacteria.  CT head with no acute intracranial abnormalities.  Chest x-ray with low lung volumes.  Patient started on Rocephin, vancomycin, IV fluids and pain medication.  [facility] contacted for admission.   Assessment and Plan:  # Toxic metabolic encephalopathy possible due to cefepime and opiates Patient presented with altered mental status ABG reviewed, no CO2 retention. Avoid sedating medications and  avoid opiates PCCM consulted, patient received 1 dose of Narcan with some improvement in the mental status Rule out DKA, encephalopathy could be due to cefepime toxicity ID consulted, recommended to stop daptomycin and cefepime, patient already treated for 12 weeks.  Leg wounds are chronic.  4/1 Narcan 1 dose order placed Insert NG tube, consulted nutritionist to start enteral feeding Continue IV fluid for hydration CT head was negative on admission, follow MRI brain and EEG to rule out other causes   Paroxysmal A-fib with RVR developed on 4/1 in the morning S/p Cardizem 10 mg IV one-time dose given S/p Lopressor 5 mg IV x 2 doses given Started Lopressor IV 5 mg 3 times daily Monitor BP and heart rate, titrate medications accordingly.   # Sepsis secondary to left fibular osteomyelitis MRI reviewed Patient was discharged on cefepime and daptomycin via PICC line to be continued through 10/19/2023 as per ID recommendation Seen by orthopedic surgery, recommended no surgical intervention Continue wound care Continue current antibiotics Stoppod antibiotics as per ID  # AKI on CKD stage IIIb Baseline creatinine range 1.1-1.6 Creatinine 2.75 on admission Continue IV fluid for hydration Monitor urine output, avoid nephrotoxic medications 3/31 Foley catheter insertion to monitor intake and output 4/1 creatinine 3.33 elevated Continue IV fluid for hydration  # Hyperkalemia due to renal failure.  Hyperkalemia resolved Accident order placed per patient is unable to take oral due to AMS Ordered D10, insulin 5 units and albuterol nebulizer Repeat BMP Continue to monitor on telemetry   # Metabolic acidosis due to  renal failure S/p Bicarbonate 150 mEq IV given on 3/31 S/p bicarbonate 50 mEq x 1 dose given on 4/1 Monitor electrolytes   # IDDM T2  Continue NovoLog sliding scale Monitor CBG Start diabetic diet when patient is able to swallow Held gabapentin due to altered mental  status   # Chronic systolic CHF and hypertension, PAD Held home medications amlodipine 5 mg, Lasix 20 mg BID, Toprol-XL 12.5 mg, irbesartan 150 mg, clonidine 0.3 mg p.o. 3 times daily, Plavix 75 mg, Held home meds due to altered mental status and sepsis Monitor BP and resume medications as per improvement  # Hypothyroid, TSH 0.1 very low and free T40.67 At lower and Patient was on Synthroid 200 mcg p.o. daily, decreased dose to 100 mcg p.o. daily when she will be able to swallow.  Overall prognosis is poor, patient remains at high risk to deteriorate and patient is full code. PCCM was consulted on 3/31 Palliative care consulted for goals of care discussion, patient has no immediate family, she has a friend so palliative will try to contact her and make final decisions.    Body mass index is 47.51 kg/m.  Interventions:  Pressure Injury 04/17/21 Sacrum Medial Stage 2 -  Partial thickness loss of dermis presenting as a shallow open injury with a red, pink wound bed without slough. (Active)  04/17/21 0444  Location: Sacrum  Location Orientation: Medial  Staging: Stage 2 -  Partial thickness loss of dermis presenting as a shallow open injury with a red, pink wound bed without slough.  Wound Description (Comments):   Present on Admission: Yes     Pressure Injury 04/17/21 Heel Left;Posterior Unstageable - Full thickness tissue loss in which the base of the injury is covered by slough (yellow, tan, gray, green or brown) and/or eschar (tan, brown or black) in the wound bed. (Active)  04/17/21   Location: Heel  Location Orientation: Left;Posterior  Staging: Unstageable - Full thickness tissue loss in which the base of the injury is covered by slough (yellow, tan, gray, green or brown) and/or eschar (tan, brown or black) in the wound bed.  Wound Description (Comments):   Present on Admission: Yes     Diet: Carb modified diet when able to swallow DVT Prophylaxis: Subcutaneous Heparin     Advance goals of care discussion: Full code  Family Communication: family was not present at bedside, at the time of interview.  The pt provided permission to discuss medical plan with the family. Opportunity was given to ask question and all questions were answered satisfactorily.  4/1 management plan discussed with patient's friend at bedside.  Disposition:  Pt is from SNF, admitted with AMS due to cefepime toxicity, still has AMS, which precludes a safe discharge. Discharge to SNF, when stable, may need few days to improve. Prognosis is poor, patient may need hospice care.  Subjective: Patient remained obtunded, overnight she developed tachycardia, in the morning time patient had A-fib with RVR which resolved with Cardizem. Patient remained obtunded, eyes are open but unable to follow command.   Physical Exam: General: Lethargic and very somnolent/obtunded.   Eyes: open and some spontaneous movement noticed ENT: Oral Mucosa Clear, but dry Neck: no JVD,  Cardiovascular: S1 and S2 Present, no Murmur,  Respiratory: Equal air entry bilaterally, decreased breath sounds due to altered mental status.  No significant crackles or wheezes Abdomen: Bowel Sound present, Soft and no tenderness,  Skin: Bilateral chronic leg wounds, wrapped with dressing Extremities: 4+ Pedal edema, no calf  tenderness Neurologic: Obtunded, very lethargic, not responsive, unable to follow commands Gait not checked due to patient safety concerns  Vitals:   09/26/23 1315 09/26/23 1330 09/26/23 1400 09/26/23 1430  BP: (!) 150/57 (!) 154/61 137/61 (!) 142/72  Pulse: 96 (!) 111 99 93  Resp: 19 19 15 15   Temp:      TempSrc:      SpO2: 97% 97% 97% 97%  Weight:      Height:        Intake/Output Summary (Last 24 hours) at 09/26/2023 1545 Last data filed at 09/26/2023 1400 Gross per 24 hour  Intake 1753 ml  Output 125 ml  Net 1628 ml   Filed Weights   09/24/23 1211 09/26/23 0500  Weight: 133 kg 129.5 kg     Data Reviewed: I have personally reviewed and interpreted daily labs, tele strips, imagings as discussed above. I reviewed all nursing notes, pharmacy notes, vitals, pertinent old records I have discussed plan of care as described above with RN and patient/family.  CBC: Recent Labs  Lab 09/24/23 1214 09/25/23 0602 09/25/23 2328 09/26/23 0125  WBC 9.8 17.6* 12.6* 11.9*  NEUTROABS 6.3 16.1* 10.3*  --   HGB 10.9* 10.1* 9.3* 9.8*  HCT 36.1 33.4* 30.5* 31.6*  MCV 90.3 92.0 90.2 89.3  PLT 415* 398 361 364   Basic Metabolic Panel: Recent Labs  Lab 09/24/23 1214 09/25/23 0602 09/25/23 1351 09/25/23 2328 09/26/23 0125  NA 140 139 140 140 141  K 5.1 5.8* 4.9 4.6 4.4  CL 108 106 107 109 110  CO2 21* 19* 17* 19* 19*  GLUCOSE 155* 212* 264* 181* 170*  BUN 38* 42* 45* 45* 45*  CREATININE 2.75* 2.85* 3.23* 3.29* 3.33*  CALCIUM 8.8* 8.7* 8.7* 8.5* 8.4*  MG  --   --  2.0  --  2.0  PHOS  --   --  5.9*  --  5.6*    Studies: DG Chest Port 1 View Result Date: 09/25/2023 CLINICAL DATA:  Sepsis EXAM: PORTABLE CHEST 1 VIEW COMPARISON:  09/24/2023 FINDINGS: Right PICC line remains in place, unchanged. Heart and mediastinal contours are within normal limits. Low lung volumes with bibasilar atelectasis and vascular congestion. No effusions. No acute bony abnormality. IMPRESSION: Low lung volumes with bibasilar atelectasis and vascular congestion. Electronically Signed   By: Charlett Nose M.D.   On: 09/25/2023 23:14    Scheduled Meds:  Chlorhexidine Gluconate Cloth  6 each Topical Daily   feeding supplement  237 mL Oral BID BM   heparin injection (subcutaneous)  5,000 Units Subcutaneous Q8H   insulin aspart  0-15 Units Subcutaneous TID WC   insulin glargine-yfgn  15 Units Subcutaneous Daily   [START ON 09/27/2023] levothyroxine  100 mcg Oral Q0600   metoprolol tartrate  5 mg Intravenous Q8H   mouth rinse  15 mL Mouth Rinse 4 times per day   sodium chloride flush  3 mL Intravenous Q12H    sodium zirconium cyclosilicate  10 g Oral Once   Continuous Infusions:   PRN Meds: acetaminophen, HYDROmorphone (DILAUDID) injection, naLOXone (NARCAN)  injection, ondansetron (ZOFRAN) IV, mouth rinse  Time spent: 55 minutes  Author: Gillis Santa. MD [facility] 09/26/2023 3:45 PM  To reach On-call, see care teams to locate the attending and reach out to them via [email or web address]. If 7PM-7AM, please contact night-coverage If you still have difficulty reaching the attending provider, please page the [facility] (Director on Call) for [facility] on amion for assistance.

## 2023-09-26 NOTE — Consult Note (Addendum)
 Consultation Note Date: 09/26/2023 at 1100  Patient Name: [name]  DOB: 1956-05-20  MRN: [id number]  Age / Sex: 68 y.o., female  PCP: Pcp, No Referring Physician: Gillis Santa, MD  HPI/Patient Profile: 68 y.o. female  with past medical history of PAD, type 2 diabetes, HTN, CKD, right fifth toe amputation, s/p THA with hypothyroidism, Bell's palsy (2015), and recent hospitalization at [facility] for osteomyelitis (3/10 - 3/21, discharged with PICC in place for IV antibiotic treatment) admitted on 09/24/2023 with altered mental status with no history of dementia.  Patient is being treated for AKI.  Creatinine has elevated on/1-3.33.  I di was consulted.  Suspicion of cefepime toxicity.  Cefepime stopped.  All other sedatives held as well.  Orthopedic surgery was consulted and recommended no surgical intervention.  Continue with wound care.  PMT was consulted to support patient and goals of care discussions.  Clinical Assessment and Goals of Care: Extensive chart review completed prior to meeting patient including labs, vital signs, imaging, progress notes, orders, and available advanced directive documents from current and previous encounters. I then met with side.  She is not awake or alert.  She is unable to follow commands.  She withdraws from painful stimuli.  However, she is unable to participate in goals of care medical decision making independently at this time.  After meeting with the patient, I spoke with her emergency contact/friend [name].    I introduced Palliative Medicine as specialized medical care for people living with serious illness. It focuses on providing relief from the symptoms and stress of a serious illness. The goal is to improve quality of life for both the patient and the family.  We discussed a brief life review of the patient. [name] shares she has been friends with [name] for  over 65 years. They met in kindergarten.  She endorses that [name] is an only child with the deceased parents.  [name] never had children.  [name] has not met any other family.  She endorses that patient may have distant cousins but that she does not know their names and has never met them.  [name] and I discussed Bentlee's diagnosis prognosis, GOC, EOL wishes, disposition and options.  I attempted to gauge Trish's understanding of [name]'s current medical situation.  She shares she does not believe [name] will make it through the night.  She shares she have a very thorough update from Dr. [name] at bedside during her visit earlier today.  We discussed patient's current illness and what it means in the larger context of patient's on-going co-morbidities.  Natural disease trajectory and expectations at EOL were discussed.  I attempted to elicit values and goals of care important to the patient.  [name] believes that the patient would not want extraordinary means to keep her alive.  She believes that if medical treatment is failing then [facility] would want to have a natural, peaceful passing.  She shares she always plead with [name] to create an advance directive or living well but that she always shared  she would just do it later.  I shared concern that patient's next of kin decision maker is unclear.  As per [place] law, in the absence of an HCPOA, legal guardian, spouse, reasonably available adult children, reasonably available adult siblings, then the next of kin decision maker would fall to an individual who has an established relationship with the patient, who is acting in good faith on behalf of the patient and who can reliably convey the patient's wishes.  When asked if [name] is willing to accept this role, she shares she wants to ensure that there are no other family members to make decisions for the patient.  Engaged with TOC to do due diligence on family/next of kin decision maker.   However, [name] is willing to speak for patient and in her best interest.  Message sent to [facility] with TOC to do due diligence for next of kin decision maker.  Awaiting response.  Attending made aware of above discussion and that [name] is willing to be patient's NOK is due diligence is completed with no other family available/willing.  Education offered regarding concept specific to human mortality and the limitations of medical interventions to prolong life when the body begins to fail to thrive.   Discussed with [name] the importance of continued conversation with family and the medical providers regarding overall plan of care and treatment options, ensuring decisions are within the context of the patient's values and GOCs.    Questions and concerns were addressed. [name] was encouraged to call with questions or concerns.   PMT will continue to follow and support patient throughout her hospitalization.  Primary Decision Maker OTHER - family? Or friend [name]?  Physical Exam Vitals reviewed.  Constitutional:      General: She is not in acute distress.    Appearance: She is ill-appearing.  HENT:     Head: Normocephalic.     Mouth/Throat:     Mouth: Mucous membranes are moist.  Pulmonary:     Effort: Pulmonary effort is normal.  Abdominal:     Palpations: Abdomen is soft.  Musculoskeletal:     Comments: Does not MAETC, generalized weakness, withdraws from painful stimuli  Skin:    General: Skin is warm and dry.     Comments: Flushed appearance  Neurological:     Comments: nonverbal     Palliative Assessment/Data: 30%     Thank you for this consult. Palliative medicine will continue to follow and assist holistically.   Time Total: 75 minutes  Time spent includes: Detailed review of medical records (labs, imaging, vital signs), medically appropriate exam (mental status, respiratory, cardiac, skin), discussed with treatment team, counseling and educating patient, family  and staff, documenting clinical information, medication management and coordination of care.  Signed by: Georgiann Cocker, DNP, FNP-BC Palliative Medicine   Please contact Palliative Medicine Team providers via [facility] for questions and concerns.

## 2023-09-27 ENCOUNTER — Inpatient Hospital Stay

## 2023-09-27 ENCOUNTER — Inpatient Hospital Stay (HOSPITAL_COMMUNITY)
Admit: 2023-09-27 | Discharge: 2023-09-27 | Disposition: A | Discharge status: Home / Self Care | Attending: Internal Medicine | Admitting: Internal Medicine

## 2023-09-27 DIAGNOSIS — G253 Myoclonus: Secondary | ICD-10-CM | POA: Diagnosis not present

## 2023-09-27 DIAGNOSIS — R569 Unspecified convulsions: Secondary | ICD-10-CM | POA: Diagnosis not present

## 2023-09-27 DIAGNOSIS — R6520 Severe sepsis without septic shock: Secondary | ICD-10-CM | POA: Diagnosis not present

## 2023-09-27 DIAGNOSIS — G9341 Metabolic encephalopathy: Secondary | ICD-10-CM | POA: Diagnosis not present

## 2023-09-27 DIAGNOSIS — N179 Acute kidney failure, unspecified: Secondary | ICD-10-CM | POA: Diagnosis not present

## 2023-09-27 DIAGNOSIS — R4182 Altered mental status, unspecified: Secondary | ICD-10-CM | POA: Diagnosis not present

## 2023-09-27 DIAGNOSIS — A419 Sepsis, unspecified organism: Secondary | ICD-10-CM | POA: Diagnosis not present

## 2023-09-27 DIAGNOSIS — G9340 Encephalopathy, unspecified: Secondary | ICD-10-CM | POA: Diagnosis not present

## 2023-09-27 DIAGNOSIS — N189 Chronic kidney disease, unspecified: Secondary | ICD-10-CM | POA: Diagnosis not present

## 2023-09-27 DIAGNOSIS — I5022 Chronic systolic (congestive) heart failure: Secondary | ICD-10-CM | POA: Diagnosis not present

## 2023-09-27 LAB — BASIC METABOLIC PANEL WITH GFR
Anion gap: 15 (ref 5–15)
BUN: 50 mg/dL — ABNORMAL HIGH (ref 8–23)
CO2: 19 mmol/L — ABNORMAL LOW (ref 22–32)
Calcium: 8.5 mg/dL — ABNORMAL LOW (ref 8.9–10.3)
Chloride: 106 mmol/L (ref 98–111)
Creatinine, Ser: 3.81 mg/dL — ABNORMAL HIGH (ref 0.44–1.00)
GFR, Estimated: 12 mL/min — ABNORMAL LOW (ref >60)
Glucose, Bld: 171 mg/dL — ABNORMAL HIGH (ref 70–99)
Potassium: 4.6 mmol/L (ref 3.5–5.1)
Sodium: 140 mmol/L (ref 135–145)

## 2023-09-27 LAB — ECHOCARDIOGRAM COMPLETE
AR max vel: 2.61 cm2
AV Area VTI: 2.76 cm2
AV Area mean vel: 2.66 cm2
AV Mean grad: 6.7 mmHg
AV Peak grad: 12.3 mmHg
Ao pk vel: 1.76 m/s
Area-P 1/2: 3.37 cm2
Height: 65 in
MV VTI: 2.37 cm2
S' Lateral: 1.9 cm
Weight: 4716.08 [oz_av]

## 2023-09-27 LAB — CBC
HCT: 34.2 % — ABNORMAL LOW (ref 36.0–46.0)
Hemoglobin: 10.3 g/dL — ABNORMAL LOW (ref 12.0–15.0)
MCH: 26.8 pg (ref 26.0–34.0)
MCHC: 30.1 g/dL (ref 30.0–36.0)
MCV: 88.8 fL (ref 80.0–100.0)
Platelets: 360 10*3/uL (ref 150–400)
RBC: 3.85 MIL/uL — ABNORMAL LOW (ref 3.87–5.11)
RDW: 19.7 % — ABNORMAL HIGH (ref 11.5–15.5)
WBC: 10 10*3/uL (ref 4.0–10.5)
nRBC: 0 % (ref 0.0–0.2)

## 2023-09-27 LAB — GLUCOSE, CAPILLARY
Glucose-Capillary: 153 mg/dL — ABNORMAL HIGH (ref 70–99)
Glucose-Capillary: 98 mg/dL (ref 70–99)
Glucose-Capillary: 91 mg/dL (ref 70–99)
Glucose-Capillary: 85 mg/dL (ref 70–99)

## 2023-09-27 LAB — PHOSPHORUS: Phosphorus: 5.7 mg/dL — ABNORMAL HIGH (ref 2.5–4.6)

## 2023-09-27 LAB — MAGNESIUM: Magnesium: 2.2 mg/dL (ref 1.7–2.4)

## 2023-09-27 LAB — AMMONIA: Ammonia: 16 umol/L (ref 9–35)

## 2023-09-27 MED ORDER — METOPROLOL TARTRATE 5 MG/5ML IV SOLN
10.0000 mg | Freq: Four times a day (QID) | INTRAVENOUS | Status: DC
  Administered 2023-09-28 – 2023-09-29 (×7): 10 mg via INTRAVENOUS
  Filled 2023-09-27 (×7): qty 10

## 2023-09-27 MED ORDER — METOPROLOL TARTRATE 5 MG/5ML IV SOLN
5.0000 mg | Freq: Once | INTRAVENOUS | Status: AC
  Administered 2023-09-27: 5 mg via INTRAVENOUS
  Filled 2023-09-27: qty 5

## 2023-09-27 MED ORDER — IOHEXOL 300 MG/ML  SOLN: 30.0000 mL | Freq: Once | INTRAMUSCULAR | Status: DC | PRN

## 2023-09-27 MED ORDER — PROSOURCE TF20 ENFIT COMPATIBL EN LIQD
60.0000 mL | Freq: Two times a day (BID) | ENTERAL | Status: DC
  Administered 2023-09-28 (×2): 60 mL
  Filled 2023-09-27: qty 60

## 2023-09-27 MED ORDER — ADULT MULTIVITAMIN W/MINERALS CH
1.0000 | ORAL_TABLET | Freq: Every day | ORAL | Status: DC
  Administered 2023-09-28: 1
  Filled 2023-09-27 (×2): qty 1

## 2023-09-27 MED ORDER — VITAL HIGH PROTEIN PO LIQD: 1000.0000 mL | ORAL | Status: DC

## 2023-09-27 MED ORDER — METOPROLOL TARTRATE 5 MG/5ML IV SOLN
5.0000 mg | Freq: Four times a day (QID) | INTRAVENOUS | Status: DC
  Administered 2023-09-27 (×2): 5 mg via INTRAVENOUS
  Filled 2023-09-27 (×2): qty 5

## 2023-09-27 MED ORDER — SODIUM BICARBONATE 8.4 % IV SOLN
50.0000 meq | Freq: Once | INTRAVENOUS | Status: AC
Start: 2023-09-27 — End: 2023-09-27
  Administered 2023-09-27: 50 meq via INTRAVENOUS
  Filled 2023-09-27: qty 50

## 2023-09-27 MED ORDER — FREE WATER
30.0000 mL | Status: DC
  Administered 2023-09-28 – 2023-09-29 (×6): 30 mL

## 2023-09-27 MED ORDER — THIAMINE MONONITRATE 100 MG PO TABS
100.0000 mg | ORAL_TABLET | Freq: Every day | ORAL | Status: DC
  Administered 2023-09-28: 100 mg
  Filled 2023-09-27 (×2): qty 1

## 2023-09-27 MED ORDER — OSMOLITE 1.5 CAL PO LIQD
1000.0000 mL | ORAL | Status: DC
  Administered 2023-09-28: 1000 mL

## 2023-09-27 MED ORDER — PERFLUTREN LIPID MICROSPHERE
1.0000 mL | INTRAVENOUS | Status: AC | PRN
  Administered 2023-09-27: 6 mL via INTRAVENOUS

## 2023-09-27 MED ORDER — METOPROLOL TARTRATE 5 MG/5ML IV SOLN: 5.0000 mg | Freq: Four times a day (QID) | INTRAVENOUS | Status: DC

## 2023-09-27 NOTE — Progress Notes (Signed)
 EEG complete - results pending

## 2023-09-27 NOTE — Progress Notes (Signed)
 Initial Nutrition Assessment  DOCUMENTATION CODES:   Morbid obesity  INTERVENTION:   -TF via NGT:   Initiate Osmolite 1.5 @ 20 ml/hr and increase by 10 ml every 12 hours to goal rate of 60 ml/hr.   60 ml Prosource TF daily  30 ml free water flush every 4 hours   Tube feeding regimen provides 1960 kcal (100% of needs), 115 grams of protein, and 914 ml of H2O. Total free water: 1094 ml daily  -Monitor Mg, K, and Phos and replete as needed due to high refeeding risk -MVI with minerals daily -100 mg thiamine daily x 7 days  NUTRITION DIAGNOSIS:   Inadequate oral intake related to inability to eat as evidenced by NPO status.  GOAL:   Patient will meet greater than or equal to 90% of their needs  MONITOR:   Diet advancement, TF tolerance  REASON FOR ASSESSMENT:   Consult Assessment of nutrition requirement/status, Enteral/tube feeding initiation and management  ASSESSMENT:   Pt with medical history significant of chronic bilateral lower extremity wounds with recent diagnosis of left fibula osteomyelitis, HFrEF with last EF of 45%, chronic lymphedema, hypertension, type 2 diabetes, PAD, CKD stage IIIa, who presents due to altered mental  Pt admitted with sepsis and osteomyelitis of lt fibula.   4/1- NGT placed, KUB confirmed tip of tube in gastric fundus  Reviewed I/O's: +660 ml x 24 hours and +5.5 L since admission  UOP: 300 ml x 24 hours  Per H&P, pt is from [facility]. Pt had been increasing altered for 1-2 days PTA and refusing medications. At baseline she is alert and oriented x 4.   Per orthopedics notes, pt with chronic venous stasis ulcers of both legs with chronic vs acute osteomyelitis of the left fibula.   Pt lying in bed at time of visit. Pt did not respond to voice or touch. No family at bedside.   NGT placed and plan to start TF today.   Wt has been stable over the past month.   Medications reviewed and include lokelma and 0.9% sodium chloride  infusion @ 75 ml/hr.   Palliative care following for goals of care discussions.   Lab Results  Component Value Date   HGBA1C 8.1 (H) 09/06/2023   PTA DM medications are 3 units insulin aspart TID and 14 units lantus solostar BID.   Labs reviewed: Phos: 5.7, CBGS: 98-153 (inpatient orders for glycemic control are 0-15 units insulin aspart TID with meals and 15 units insulin glargine-yfgn daily).    NUTRITION - FOCUSED PHYSICAL EXAM:  Flowsheet Row Most Recent Value  Orbital Region No depletion  Upper Arm Region No depletion  Thoracic and Lumbar Region No depletion  Buccal Region No depletion  Temple Region No depletion  Clavicle Bone Region No depletion  Clavicle and Acromion Bone Region No depletion  Scapular Bone Region No depletion  Dorsal Hand No depletion  Patellar Region No depletion  Anterior Thigh Region No depletion  Posterior Calf Region No depletion  Edema (RD Assessment) Moderate  Hair Reviewed  Eyes Reviewed  Mouth Reviewed  Skin Reviewed  Nails Reviewed       Diet Order:   Diet Order             Diet NPO time specified  Diet effective now                   EDUCATION NEEDS:   Not appropriate for education at this time  Skin:  Skin  Assessment: Skin Integrity Issues: Skin Integrity Issues:: Other (Comment) Other: chronic venous stasis ulcers of both legs with chronic vs acute osteomyelitis of the left fibula  Last BM:  Unknown  Height:   Ht Readings from Last 1 Encounters:  09/24/23 5\' 5"  (1.651 m)    Weight:   Wt Readings from Last 1 Encounters:  09/27/23 133.7 kg    Ideal Body Weight:  56.8 kg  BMI:  Body mass index is 49.05 kg/m.  Estimated Nutritional Needs:   Kcal:  1800-2000  Protein:  100-115 grams  Fluid:  1.8-2.0 L    [name], RD, LDN, CDCES Registered Dietitian III Certified Diabetes Care and Education Specialist If unable to reach this RD, please use "RD Inpatient" group chat on secure chat between hours  of 8am-4 pm daily

## 2023-09-27 NOTE — Care Management Important Message (Signed)
 Important Message  Patient Details  Name: [name] MRN: [id number] Date of Birth: 31-Jul-1955   Important Message Given:  Other (see comment) (Telephone call made to contact person, Rowland Lathe and spoke with her to inform her a copy of IM was left for patient.)     Marcell Anger 09/27/2023, 11:16 AM

## 2023-09-27 NOTE — Plan of Care (Signed)

## 2023-09-27 NOTE — Progress Notes (Signed)
 Palliative Care Progress Note, Assessment & Plan   Patient Name: [name]       Date: 09/27/2023 DOB: [birth year]  Age: 68 y.o. MRN#: [id number] Attending Physician: Gillis Santa, MD Primary Care Physician: Pcp, No Admit Date: 09/24/2023  Subjective: Patient is lying in bed in no apparent distress.  Dobbhoff is present to left nare.  No family or friends present during my visit.  Patient awakens to my presence.  She does not meet my gaze.  She responds to physical/painful stimuli.  She did makes no verbal attempts to communicate with me.  HPI: 68 y.o. female  with past medical history of PAD, type 2 diabetes, HTN, CKD, right fifth toe amputation, s/p THA with hypothyroidism, Bell's palsy (2015), and recent hospitalization at [facility] for osteomyelitis (3/10 - 3/21, discharged with PICC in place for IV antibiotic treatment) admitted on 09/24/2023 with altered mental status with no history of dementia.   Patient is being treated for AKI.  Creatinine has elevated on/1-3.33.   I di was consulted.  Suspicion of cefepime toxicity.  Cefepime stopped.  All other sedatives held as well.   Orthopedic surgery was consulted and recommended no surgical intervention.  Continue with wound care.   PMT was consulted to support patient and goals of care discussions.  Summary of counseling/coordination of care: Extensive chart review completed prior to meeting patient including labs, vital signs, imaging, progress notes, orders, and available advanced directive documents from current and previous encounters.   After reviewing the patient's chart and assessing the patient at bedside, I attempted to assess patient's symptoms.  She is unable to participate in symptom assessment or medical decision making at this  time.  Nonverbal signs of distress not noted.  No grimacing, moaning, brow furrowing, fidgeting, or other acute issues noted.  No adjustment to [facility] needed.  After visiting with the patient, I communicated via secure chat with [facility] Chana Bode and supervisor Cyndia Bent in regards to next of kin decision maker.  Patient's lifelong friend [name] has agreed to be patient's next of kin decision maker in the event that other family has not been able to be located.  Awaiting TOC's response as to how to proceed with due diligence and NOK determination.   PMT will continue to follow and support patient.  Physical Exam Vitals reviewed.  Constitutional:      General: She is not in acute distress.    Appearance: She is obese.  HENT:     Head: Normocephalic.     Mouth/Throat:     Mouth: Mucous membranes are moist.  Cardiovascular:     Rate and Rhythm: Normal rate.  Pulmonary:     Effort: Pulmonary effort is normal.  Abdominal:     Palpations: Abdomen is soft.  Skin:    General: Skin is warm and dry.  Neurological:     Comments: Withdrawals from painful stimuli             Total Time 35 minutes   Time spent includes: Detailed review of medical records (labs, imaging, vital signs), medically appropriate exam (mental status, respiratory, cardiac, skin), discussed with treatment team, counseling and educating patient, family and staff, documenting clinical information, medication management  and coordination of care.  Autumn Deist L. Bonita Quin, DNP, FNP-BC Palliative Medicine Team

## 2023-09-27 NOTE — Consult Note (Signed)
 [facility]  CONSULT NOTE    Date: 09/27/2023                  Patient Name:  [name]  MRN: [id number]  DOB: 02/02/1956  Age / Sex: 68 y.o., female         PCP: Pcp, No                 Service Requesting Consult: TRH                 Reason for Consult: Acute kidney injury            History of Present Illness: [name] is a 68 y.o.  female with past medical history of chronic bilateral lower extremity wounds, left fibula osteomyelitis, HFrEF at last EF 45%,  chronic lymhedema, hypertension diabetes, and chronic kidney disease stage !!!b, who was admitted to [facility] on 09/24/2023 for Sepsis [facility]) [A41.9]  Patient presents to ED with altered mental status. Patient is a current resident at [facility]. Staff report patient was confused for 2 to 3 days, refusing medications.  Patient seen resting in bed.  Eyes open to name, moaning.  No family or friends at bedside.  2 L nasal cannula.    Creatinine on ED arrival 2.75 with GFR 18.  Potassium borderline at 5.1.  Potassium spiked to 5.8.  Creatinine has peaked at 3.81 today.CK 36. Lactic acid peaked at 3.6. White count has improved during admission.   Medications: Outpatient medications: Medications Prior to Admission  Medication Sig Dispense Refill Last Dose/Taking   acetaminophen (TYLENOL) 325 MG tablet Take 2 tablets (650 mg total) by mouth every 6 (six) hours as needed for mild pain (or Fever >/= 101).   Taking As Needed   amLODipine (NORVASC) 5 MG tablet Take 5 mg by mouth daily.   09/24/2023 at  8:00 AM   ascorbic acid (VITAMIN C) 500 MG tablet Take 1 tablet (500 mg total) by mouth daily. 30 tablet 0 09/24/2023 at  8:00 AM   ceFEPime (MAXIPIME) IVPB Inject 2 g into the vein every 12 (twelve) hours. Indication:  L leg (fibula) osteomyelitis Last Day of Therapy:  10/19/2023 Labs - Once weekly:  CBC/D, CMP, CPK, ESR and CRP Fax weekly lab results  promptly to [phone]  Method of  administration: IV Push Method of administration may be changed at the discretion of facility and its pharmacy Please pull PIC at completion of IV antibiotics Call [phone] with critical values or questions 72 Units 0 09/24/2023 at  8:00 AM   cloNIDine (CATAPRES) 0.3 MG tablet TAKE ONE TABLET BY MOUTH 3 TIMES DAILY. 270 tablet 1 09/24/2023 at 12:00 PM   clopidogrel (PLAVIX) 75 MG tablet Take 1 tablet (75 mg total) by mouth daily with breakfast. 30 tablet 0 09/24/2023 at  6:30 AM   daptomycin (CUBICIN) IVPB Inject 700 mg into the vein daily. Indication:  L leg (fibula) osteomyelitis Last Day of Therapy:  10/19/2023 Labs - Once weekly:  CBC/D, CMP, CPK, ESR and CRP Fax weekly lab results  promptly to [phone]  Method of administration: IV Push Method of administration may be changed at the discretion of facility and its pharmacy Please pull PIC at completion of IV antibiotics Call [phone] with critical values or questions 36 Units 0 09/23/2023   diclofenac Sodium (VOLTAREN) 1 % GEL Apply 2 g topically 3 (three) times daily.   09/24/2023 at  2:00 PM  ferrous gluconate (FERGON) 324 MG tablet Take 324 mg by mouth daily with breakfast.   09/24/2023 at  8:00 AM   furosemide (LASIX) 20 MG tablet Take 1 tablet (20 mg total) by mouth 2 (two) times daily. 60 tablet 0 09/24/2023 at  8:00 AM   gabapentin (NEURONTIN) 300 MG capsule Take 1 capsule (300 mg total) by mouth at bedtime.   09/23/2023   HYDROmorphone (DILAUDID) 2 MG tablet Take 0.5 tablets (1 mg total) by mouth every 4 (four) hours as needed for moderate pain (pain score 4-6). 30 tablet 0 09/24/2023 at  8:20 AM   HYDROmorphone (DILAUDID) 2 MG tablet Take 1 tablet (2 mg total) by mouth every 12 (twelve) hours as needed (with dressing changes). 30 tablet 0 Taking As Needed   hydrOXYzine (ATARAX) 10 MG tablet Take 10 mg by mouth every 8 (eight) hours as needed for itching.   Taking As Needed   insulin aspart (NOVOLOG) 100 UNIT/ML injection Inject  3 Units into the skin 3 (three) times daily with meals. 10 mL 0 09/24/2023 at 12:00 PM   irbesartan (AVAPRO) 150 MG tablet Take 1 tablet (150 mg total) by mouth daily. 30 tablet 0 09/24/2023 at  8:00 AM   LANTUS SOLOSTAR 100 UNIT/ML Solostar Pen Inject 14 Units into the skin every 12 (twelve) hours.   09/24/2023 at  8:00 AM   levothyroxine (SYNTHROID) 200 MCG tablet Take 200 mcg by mouth daily before breakfast.   09/24/2023 at  6:00 AM   metoprolol succinate (TOPROL-XL) 25 MG 24 hr tablet Take 0.5 tablets (12.5 mg total) by mouth daily with breakfast. 15 tablet 0 09/24/2023 at  8:00 AM   Multiple Vitamin (MULTIVITAMIN WITH MINERALS) TABS tablet Take 1 tablet by mouth daily. 30 tablet 0 09/24/2023   NYSTATIN powder Apply 1 application. topically 3 (three) times daily.   09/24/2023 at 12:00 PM   ondansetron (ZOFRAN-ODT) 4 MG disintegrating tablet Take 2 mg by mouth every 6 (six) hours as needed.   Taking As Needed   senna-docusate (SENOKOT-S) 8.6-50 MG tablet Take 2 tablets by mouth at bedtime.   09/23/2023   zinc oxide 20 % ointment Apply 1 Application topically as needed for diaper changes.   09/24/2023   blood glucose meter kit and supplies 1 each by Other route daily. ONE TOUCH ULTRA METER. Use as directed. E11.29 1 each 0    camphor-menthol (SARNA) lotion Apply topically as needed for itching. (Patient not taking: Reported on 09/24/2023)   Not Taking   gentamicin ointment (GARAMYCIN) 0.1 % Apply 1 Application topically daily. (Patient not taking: Reported on 09/24/2023)   Not Taking   glucose blood test strip 1 each by Other route daily. ONE TOUCH ULTRA TEST STRIPS E11.29 100 strip 3    Lancets (ONETOUCH ULTRASOFT) lancets 1 each by Other route daily. Use as instructed E11.29 100 each 3    Nutritional Supplements (,FEEDING SUPPLEMENT, PROSOURCE PLUS) liquid Take 30 mLs by mouth 3 (three) times daily between meals. (Patient not taking: Reported on 09/24/2023) 2700 mL 0 Not Taking   Nystatin (GERHARDT'S BUTT  CREAM) CREA Apply 1 Application topically daily. (Patient not taking: Reported on 09/24/2023)   Not Taking   Vitamin D, Ergocalciferol, (DRISDOL) 1.25 MG (50000 UNIT) CAPS capsule Take 50,000 Units by mouth every 7 (seven) days. (Patient not taking: Reported on 09/24/2023)   Not Taking    Current medications: Current Facility-Administered Medications  Medication Dose Route Frequency Provider Last Rate Last Admin   0.9 %  sodium chloride infusion   Intravenous Continuous Gillis Santa, MD 75 mL/hr at 09/27/23 0548 Infusion Verify at 09/27/23 0548   acetaminophen (TYLENOL) tablet 650 mg  650 mg Oral Q6H PRN Gillis Santa, MD       Or   acetaminophen (TYLENOL) suppository 650 mg  650 mg Rectal Q6H PRN Gillis Santa, MD       Chlorhexidine Gluconate Cloth 2 % PADS 6 each  6 each Topical Daily Gillis Santa, MD   6 each at 09/26/23 0848   feeding supplement (ENSURE ENLIVE / ENSURE PLUS) liquid 237 mL  237 mL Oral BID BM Gillis Santa, MD       feeding supplement (OSMOLITE 1.5 CAL) liquid 1,000 mL  1,000 mL Per Tube Continuous Gillis Santa, MD       feeding supplement (PROSource TF20) liquid 60 mL  60 mL Per Tube BID Gillis Santa, MD       free water 30 mL  30 mL Per Tube Q4H Gillis Santa, MD       heparin injection 5,000 Units  5,000 Units Subcutaneous Q8H Mila Merry A, RPH   5,000 Units at 09/27/23 1444   insulin aspart (novoLOG) injection 0-15 Units  0-15 Units Subcutaneous TID WC Verdene Lennert, MD   3 Units at 09/27/23 6295   insulin glargine-yfgn [facility]) injection 15 Units  15 Units Subcutaneous Daily Orson Aloe, [facility]   15 Units at 09/26/23 2111   levothyroxine (SYNTHROID) tablet 100 mcg  100 mcg Oral Q0600 Gillis Santa, MD   100 mcg at 09/27/23 0505   metoprolol tartrate (LOPRESSOR) injection 5 mg  5 mg Intravenous Q6H Gillis Santa, MD   5 mg at 09/27/23 1430   multivitamin with minerals tablet 1 tablet  1 tablet Per Tube Daily Gillis Santa, MD       naloxone [facility])  injection 0.4 mg  0.4 mg Intravenous PRN Gillis Santa, MD       ondansetron [facility]) injection 4 mg  4 mg Intravenous Q6H PRN Verdene Lennert, MD   4 mg at 09/25/23 0245   Oral care mouth rinse  15 mL Mouth Rinse 4 times per day Gillis Santa, MD   15 mL at 09/27/23 1432   Oral care mouth rinse  15 mL Mouth Rinse PRN Gillis Santa, MD       sodium chloride flush (NS) 0.9 % injection 3 mL  3 mL Intravenous Q12H Verdene Lennert, MD   3 mL at 09/27/23 1018   sodium zirconium cyclosilicate (LOKELMA) packet 10 g  10 g Oral Once Gillis Santa, MD       thiamine (VITAMIN B1) tablet 100 mg  100 mg Per Tube Daily Gillis Santa, MD          Allergies: No Known Allergies    Past Medical History: Past Medical History:  Diagnosis Date   Acute congestive heart failure (HCC) 09/26/2023   Diabetes mellitus without complication (HCC) 2010   Hypertension    Hypothyroidism    Kidney stone 11/2014   history of/STAGE 4 KIDNEY DISEASE PER DR Wynelle Link   Lymphedema    Multiple open wounds of lower leg    PT BEING SEEN BY THE [facility] FOR CHRONIC BLISTERS PER PT     Past Surgical History: Past Surgical History:  Procedure Laterality Date   AMPUTATION Right 05/03/2021   Procedure: AMPUTATION RAY;  Surgeon: Rosetta Posner, DPM;  Location: [facility] ORS;  Service: Podiatry;  Laterality: Right;  RIght Big Toe and Right  Fifth Toe   IRRIGATION AND DEBRIDEMENT FOOT Bilateral 05/03/2021   Procedure: IRRIGATION AND DEBRIDEMENT FEET;  Surgeon: Rosetta Posner, DPM;  Location: [facility] ORS;  Service: Podiatry;  Laterality: Bilateral;   LOWER EXTREMITY ANGIOGRAPHY Right 04/27/2021   Procedure: Lower Extremity Angiography;  Surgeon: Renford Dills, MD;  Location: [facility] INVASIVE CV LAB;  Service: Cardiovascular;  Laterality: Right;   LOWER EXTREMITY ANGIOGRAPHY Left 04/30/2021   Procedure: Lower Extremity Angiography;  Surgeon: Annice Needy, MD;  Location: [facility] INVASIVE CV LAB;  Service: Cardiovascular;  Laterality: Left;    TOTAL HIP ARTHROPLASTY Right 05/28/2015   Procedure: TOTAL HIP ARTHROPLASTY ANTERIOR APPROACH;  Surgeon: Kennedy Bucker, MD;  Location: [facility] ORS;  Service: Orthopedics;  Laterality: Right;     Family History: Family History  Problem Relation Age of Onset   Cancer Mother    Diabetes Father      Social History: Social History   Socioeconomic History   Marital status: Single    Spouse name: Not on file   Number of children: 0   Years of education: Not on file   Highest education level: Not on file  Occupational History   Not on file  Tobacco Use   Smoking status: Never   Smokeless tobacco: Never  Vaping Use   Vaping status: Never Used  Substance and Sexual Activity   Alcohol use: No   Drug use: No   Sexual activity: Never  Other Topics Concern   Not on file  Social History Narrative   Pt lives alone   Social Drivers of Health   Financial Resource Strain: Low Risk  (03/10/2021)   Overall Financial Resource Strain (CARDIA)    Difficulty of Paying Living Expenses: Not very hard  Food Insecurity: No Food Insecurity (09/25/2023)   Hunger Vital Sign    Worried About Running Out of Food in the Last Year: Never true    Ran Out of Food in the Last Year: Never true  Transportation Needs: No Transportation Needs (09/25/2023)   PRAPARE - Administrator, Civil Service (Medical): No    Lack of Transportation (Non-Medical): No  Physical Activity: Inactive (03/10/2021)   Exercise Vital Sign    Days of Exercise per Week: 0 days    Minutes of Exercise per Session: 0 min  Stress: No Stress Concern Present (03/10/2021)   [employer] of Occupational Health - Occupational Stress Questionnaire    Feeling of Stress : Only a little  Social Connections: Socially Isolated (09/25/2023)   Social Connection and Isolation Panel [NHANES]    Frequency of Communication with Friends and Family: More than three times a week    Frequency of Social Gatherings with Friends and Family:  Three times a week    Attends Religious Services: Never    Active Member of Clubs or Organizations: No    Attends Banker Meetings: Never    Marital Status: Never married  Intimate Partner Violence: Not At Risk (09/25/2023)   Humiliation, Afraid, Rape, and Kick questionnaire    Fear of Current or Ex-Partner: No    Emotionally Abused: No    Physically Abused: No    Sexually Abused: No     Review of Systems: Review of Systems  Unable to perform ROS: Mental status change    Vital Signs: Blood pressure (!) 115/57, pulse 79, temperature 97.8 F (36.6 C), temperature source Oral, resp. rate 14, height 5\' 5"  (1.651 m), weight 133.7 kg, SpO2 100%.  Weight trends: [employer]  09/24/23 1211 09/26/23 0500 09/27/23 0529  Weight: 133 kg 129.5 kg 133.7 kg    Physical Exam: General: Ill appearing  Head: Normocephalic, atraumatic. Moist oral mucosal membranes  Eyes: Anicteric  Lungs:  Clear to auscultation, normal effort  Heart: Regular rate and rhythm  Abdomen:  Soft, midline tenderness, obese  Extremities:  trace peripheral edema.  Neurologic: Somnolent  Skin: No lesions  Access: None     Lab results: Basic Metabolic Panel: Recent Labs  Lab 09/25/23 1351 09/25/23 2328 09/26/23 0125 09/27/23 0151  NA 140 140 141 140  K 4.9 4.6 4.4 4.6  CL 107 109 110 106  CO2 17* 19* 19* 19*  GLUCOSE 264* 181* 170* 171*  BUN 45* 45* 45* 50*  CREATININE 3.23* 3.29* 3.33* 3.81*  CALCIUM 8.7* 8.5* 8.4* 8.5*  MG 2.0  --  2.0 2.2  PHOS 5.9*  --  5.6* 5.7*    Liver Function Tests: Recent Labs  Lab 09/24/23 1214 09/25/23 0602 09/25/23 2328  AST 23 17 19   ALT 14 12 11   ALKPHOS 76 69 62  BILITOT 0.6 0.6 0.7  PROT 7.4 7.1 6.6  ALBUMIN 2.5* 2.2* 2.2*   No results for input(s): "LIPASE", "AMYLASE" in the last 168 hours. Recent Labs  Lab 09/27/23 0859  AMMONIA 16    CBC: Recent Labs  Lab 09/24/23 1214 09/25/23 0602 09/25/23 2328 09/26/23 0125 09/27/23 0151   WBC 9.8 17.6* 12.6* 11.9* 10.0  NEUTROABS 6.3 16.1* 10.3*  --   --   HGB 10.9* 10.1* 9.3* 9.8* 10.3*  HCT 36.1 33.4* 30.5* 31.6* 34.2*  MCV 90.3 92.0 90.2 89.3 88.8  PLT 415* 398 361 364 360    Cardiac Enzymes: Recent Labs  Lab 09/25/23 0602  CKTOTAL 36*    BNP: Invalid input(s): "POCBNP"  CBG: Recent Labs  Lab 09/26/23 1126 09/26/23 1702 09/26/23 2103 09/27/23 0746 09/27/23 1127  GLUCAP 108* 144* 145* 153* 98    Microbiology: Results for orders placed or performed during the hospital encounter of 09/24/23  Resp panel by RT-PCR (RSV, Flu A&B, Covid) Anterior Nasal Swab     Status: None   Collection Time: 09/24/23 12:21 PM   Specimen: Anterior Nasal Swab  Result Value Ref Range Status   SARS Coronavirus 2 by RT PCR NEGATIVE NEGATIVE Final    Comment: (NOTE) SARS-CoV-2 target nucleic acids are NOT DETECTED.  The SARS-CoV-2 RNA is generally detectable in upper respiratory specimens during the acute phase of infection. The lowest concentration of SARS-CoV-2 viral copies this assay can detect is 138 copies/mL. A negative result does not preclude SARS-Cov-2 infection and should not be used as the sole basis for treatment or other patient management decisions. A negative result may occur with  improper specimen collection/handling, submission of specimen other than nasopharyngeal swab, presence of viral mutation(s) within the areas targeted by this assay, and inadequate number of viral copies(<138 copies/mL). A negative result must be combined with clinical observations, patient history, and epidemiological information. The expected result is Negative.  Fact Sheet for Patients:  BloggerCourse.com  Fact Sheet for Healthcare Providers:  SeriousBroker.it  This test is no t yet approved or cleared by the [place] FDA and  has been authorized for detection and/or diagnosis of SARS-CoV-2 by FDA under an Emergency  Use Authorization (EUA). This EUA will remain  in effect (meaning this test can be used) for the duration of the COVID-19 declaration under Section 564(b)(1) of the Act, 21 U.S.C.section 360bbb-3(b)(1), unless the authorization is  terminated  or revoked sooner.       Influenza A by PCR NEGATIVE NEGATIVE Final   Influenza B by PCR NEGATIVE NEGATIVE Final    Comment: (NOTE) The Xpert Xpress SARS-CoV-2/FLU/RSV plus assay is intended as an aid in the diagnosis of influenza from Nasopharyngeal swab specimens and should not be used as a sole basis for treatment. Nasal washings and aspirates are unacceptable for Xpert Xpress SARS-CoV-2/FLU/RSV testing.  Fact Sheet for Patients: BloggerCourse.com  Fact Sheet for Healthcare Providers: SeriousBroker.it  This test is not yet approved or cleared by the [place] FDA and has been authorized for detection and/or diagnosis of SARS-CoV-2 by FDA under an Emergency Use Authorization (EUA). This EUA will remain in effect (meaning this test can be used) for the duration of the COVID-19 declaration under Section 564(b)(1) of the Act, 21 U.S.C. section 360bbb-3(b)(1), unless the authorization is terminated or revoked.     Resp Syncytial Virus by PCR NEGATIVE NEGATIVE Final    Comment: (NOTE) Fact Sheet for Patients: BloggerCourse.com  Fact Sheet for Healthcare Providers: SeriousBroker.it  This test is not yet approved or cleared by the [place] FDA and has been authorized for detection and/or diagnosis of SARS-CoV-2 by FDA under an Emergency Use Authorization (EUA). This EUA will remain in effect (meaning this test can be used) for the duration of the COVID-19 declaration under Section 564(b)(1) of the Act, 21 U.S.C. section 360bbb-3(b)(1), unless the authorization is terminated or revoked.  Performed at [facility], [address] [place], [place] [address]   Urine Culture     Status: None   Collection Time: 09/24/23 12:21 PM   Specimen: Urine, Random  Result Value Ref Range Status   Specimen Description   Final    URINE, RANDOM Performed at [facility], [address], [place], [place] [address]    Special Requests   Final    NONE Reflexed from [phone] Performed at [facility], [address] [place], [place] [address]    Culture   Final    NO GROWTH Performed at [facility], [address], [place], [place] [address]    Report Status 09/25/2023 FINAL  Final  Blood Culture (routine x 2)     Status: None (Preliminary result)   Collection Time: 09/24/23 12:22 PM   Specimen: BLOOD LEFT FOREARM  Result Value Ref Range Status   Specimen Description BLOOD LEFT FOREARM  Final   Special Requests   Final    BOTTLES DRAWN AEROBIC ONLY Blood Culture results may not be optimal due to an inadequate volume of blood received in culture bottles   Culture   Final    NO GROWTH 3 DAYS Performed at [facility], [address], [place], [place] [address]    Report Status PENDING  Incomplete  Blood Culture (routine x 2)     Status: None (Preliminary result)   Collection Time: 09/24/23 12:23 PM   Specimen: Left Antecubital; Blood  Result Value Ref Range Status   Specimen Description LEFT ANTECUBITAL  Final   Special Requests   Final    BOTTLES DRAWN AEROBIC AND ANAEROBIC Blood Culture adequate volume   Culture   Final    NO GROWTH 3 DAYS Performed at [facility], [address] [place], [place] [address]    Report Status PENDING  Incomplete  MRSA Next Gen by PCR, Nasal     Status: None   Collection Time: 09/25/23  8:44 PM  Specimen: Nasal Mucosa; Nasal Swab  Result Value Ref Range Status   MRSA by PCR Next Gen NOT DETECTED NOT DETECTED Final    Comment: (NOTE) The GeneXpert MRSA Assay (FDA approved for NASAL specimens only), is one component of a  comprehensive MRSA colonization surveillance program. It is not intended to diagnose MRSA infection nor to guide or monitor treatment for MRSA infections. Test performance is not FDA approved in patients less than 42 years old. Performed at [facility], [address] [place], [place] [address]     Coagulation Studies: Recent Labs    09/25/23 2328  LABPROT 17.8*  INR 1.4*    Urinalysis: No results for input(s): "COLORURINE", "LABSPEC", "PHURINE", "GLUCOSEU", "HGBUR", "BILIRUBINUR", "KETONESUR", "PROTEINUR", "UROBILINOGEN", "NITRITE", "LEUKOCYTESUR" in the last 72 hours.  Invalid input(s): "APPERANCEUR"    Imaging: DG Abd 1 View Result Date: 09/27/2023 CLINICAL DATA:  Enteric catheter placement EXAM: ABDOMEN - 1 VIEW COMPARISON:  09/26/2023 FINDINGS: Frontal view of the left hemiabdomen was obtained, excluding the right hemiabdomen and pelvis by collimation. Enteric catheter identified passing below diaphragm, tip projecting over the gastric fundus. Multiple distended gas-filled loops of small bowel are seen within the central abdomen, measuring up to 3 cm in diameter. Paucity of colonic gas. IMPRESSION: 1. Enteric catheter tip projecting over the gastric fundus. 2. Diffuse gaseous distention of the small bowel with relative paucity of colonic gas, which may reflect small-bowel obstruction. Electronically Signed   By: Sharlet Salina M.D.   On: 09/27/2023 14:43   ECHOCARDIOGRAM COMPLETE Result Date: 09/27/2023    ECHOCARDIOGRAM REPORT   Patient Name:   [name] Date of Exam: 09/27/2023 Medical Rec #:  [id number]         Height:       65.0 in Accession #:    [id number]        Weight:       294.8 lb Date of Birth:  27-Jul-1955          BSA:          2.333 m Patient Age:    68 years          BP:           166/86 mmHg Patient Gender: F                 HR:           108 bpm. Exam Location:  [facility] Procedure: 2D Echo, Cardiac Doppler, Color Doppler and Intracardiac             Opacification Agent (Both Spectral and Color Flow Doppler were            utilized during procedure). Indications:     CHF  History:         Patient has no prior history of Echocardiogram examinations.                  CHF, Signs/Symptoms:Edema; Risk Factors:Hypertension and                  Diabetes.  Sonographer:     Mikki Harbor Referring Phys:  [id number] Andris Baumann Diagnosing Phys: Debbe Odea MD  Sonographer Comments: Technically difficult study due to poor echo windows and patient is obese. IMPRESSIONS  1. Left ventricular ejection fraction, by estimation, is 60 to 65%. The left ventricle has normal function. The left ventricle has no regional wall motion abnormalities. There is mild left ventricular hypertrophy. Left ventricular diastolic parameters  are consistent with Grade I diastolic dysfunction (impaired relaxation).  2. Right ventricular systolic function is normal. The right ventricular size is normal.  3. The mitral valve is degenerative. Mild mitral valve regurgitation.  4. The aortic valve was not well visualized. Aortic valve regurgitation is not visualized. Aortic valve sclerosis/calcification is present, without any evidence of aortic stenosis. Aortic valve mean gradient measures 6.7 mmHg. FINDINGS  Left Ventricle: Left ventricular ejection fraction, by estimation, is 60 to 65%. The left ventricle has normal function. The left ventricle has no regional wall motion abnormalities. Definity contrast agent was given IV to delineate the left ventricular  endocardial borders. Strain was performed and the global longitudinal strain is indeterminate. The left ventricular internal cavity size was normal in size. There is mild left ventricular hypertrophy. Left ventricular diastolic parameters are consistent  with Grade I diastolic dysfunction (impaired relaxation). Right Ventricle: The right ventricular size is normal. No increase in right ventricular wall thickness. Right ventricular systolic  function is normal. Left Atrium: Left atrial size was normal in size. Right Atrium: Right atrial size was normal in size. Pericardium: There is no evidence of pericardial effusion. Mitral Valve: The mitral valve is degenerative in appearance. Mild mitral valve regurgitation. MV peak gradient, 13.2 mmHg. The mean mitral valve gradient is 8.0 mmHg. Tricuspid Valve: The tricuspid valve is normal in structure. Tricuspid valve regurgitation is not demonstrated. Aortic Valve: The aortic valve was not well visualized. Aortic valve regurgitation is not visualized. Aortic valve sclerosis/calcification is present, without any evidence of aortic stenosis. Aortic valve mean gradient measures 6.7 mmHg. Aortic valve peak gradient measures 12.3 mmHg. Aortic valve area, by VTI measures 2.76 cm. Pulmonic Valve: The pulmonic valve was normal in structure. Pulmonic valve regurgitation is not visualized. Aorta: The aortic root is normal in size and structure. IAS/Shunts: No atrial level shunt detected by color flow Doppler. Additional Comments: 3D was performed not requiring image post processing on an independent workstation and was indeterminate.  LEFT VENTRICLE PLAX 2D LVIDd:         4.40 cm   Diastology LVIDs:         1.90 cm   LV e' medial:    7.40 cm/s LV PW:         1.30 cm   LV E/e' medial:  16.8 LV IVS:        1.40 cm   LV e' lateral:   9.25 cm/s LVOT diam:     2.00 cm   LV E/e' lateral: 13.4 LV SV:         97 LV SV Index:   42 LVOT Area:     3.14 cm  RIGHT VENTRICLE RV Basal diam:  3.25 cm RV Mid diam:    2.60 cm RV S prime:     15.00 cm/s LEFT ATRIUM             Index        RIGHT ATRIUM           Index LA diam:        3.50 cm 1.50 cm/m   RA Area:     12.90 cm LA Vol (A2C):   56.1 ml 24.05 ml/m  RA Volume:   32.60 ml  13.98 ml/m LA Vol (A4C):   46.7 ml 20.02 ml/m LA Biplane Vol: 53.5 ml 22.94 ml/m  AORTIC VALVE                     PULMONIC  VALVE AV Area (Vmax):    2.61 cm      PV Vmax:       1.43 m/s AV Area  (Vmean):   2.66 cm      PV Peak grad:  8.2 mmHg AV Area (VTI):     2.76 cm AV Vmax:           175.67 cm/s AV Vmean:          120.667 cm/s AV VTI:            0.352 m AV Peak Grad:      12.3 mmHg AV Mean Grad:      6.7 mmHg LVOT Vmax:         146.00 cm/s LVOT Vmean:        102.000 cm/s LVOT VTI:          0.310 m LVOT/AV VTI ratio: 0.88  AORTA Ao Root diam: 3.30 cm Ao Asc diam:  3.40 cm MITRAL VALVE MV Area (PHT): 3.37 cm     SHUNTS MV Area VTI:   2.37 cm     Systemic VTI:  0.31 m MV Peak grad:  13.2 mmHg    Systemic Diam: 2.00 cm MV Mean grad:  8.0 mmHg MV Vmax:       1.82 m/s MV Vmean:      136.0 cm/s MV Decel Time: 225 msec MV E velocity: 124.00 cm/s MV A velocity: 148.00 cm/s MV E/A ratio:  0.84 Debbe Odea MD Electronically signed by Debbe Odea MD Signature Date/Time: 09/27/2023/12:52:04 PM    Final    MR BRAIN WO CONTRAST Result Date: 09/27/2023 CLINICAL DATA:  68 year old female with altered mental status. EXAM: MRI HEAD WITHOUT CONTRAST TECHNIQUE: Multiplanar, multiecho pulse sequences of the brain and surrounding structures were obtained without intravenous contrast. COMPARISON:  Head CT 09/24/2023 and earlier. FINDINGS: Brain: No restricted diffusion to suggest acute infarction. No midline shift, mass effect, evidence of mass lesion, ventriculomegaly, extra-axial collection or acute intracranial hemorrhage. Cervicomedullary junction and pituitary are within normal limits. Cerebral volume is within normal limits for age. Mild for age scattered white matter T2 and FLAIR hyperintensity primarily in the corona radiata. Similar heterogeneity in the pons, more so on the right. No cortical encephalomalacia. No chronic cerebral blood products identified on motion degraded SWI. Deep gray nuclei and cerebellum appear normal. Vascular: Major intracranial vascular flow voids are preserved. Skull and upper cervical spine: Negative. Visualized bone marrow signal is within normal limits. Sinuses/Orbits:  Negative. Other: Visible internal auditory structures appear normal. Negative visible scalp and face. IMPRESSION: 1. No acute intracranial abnormality. 2. Mild for age signal changes compatible with chronic small vessel disease. Electronically Signed   By: Odessa Fleming M.D.   On: 09/27/2023 10:14   DG Abd 1 View Result Date: 09/26/2023 CLINICAL DATA:  NG placement. EXAM: ABDOMEN - 1 VIEW COMPARISON:  None Available. FINDINGS: Feeding tube with weighted tip in the left upper abdomen likely in the region of the gastric fundus. IMPRESSION: Feeding tube with tip in the gastric fundus. Electronically Signed   By: Elgie Collard M.D.   On: 09/26/2023 19:42   DG Chest Port 1 View Result Date: 09/25/2023 CLINICAL DATA:  Sepsis EXAM: PORTABLE CHEST 1 VIEW COMPARISON:  09/24/2023 FINDINGS: Right PICC line remains in place, unchanged. Heart and mediastinal contours are within normal limits. Low lung volumes with bibasilar atelectasis and vascular congestion. No effusions. No acute bony abnormality. IMPRESSION: Low lung volumes with bibasilar atelectasis and vascular congestion.  Electronically Signed   By: Charlett Nose M.D.   On: 09/25/2023 23:14     Assessment & Plan: [name] is a 68 y.o.  female with past medical history of chronic bilateral lower extremity wounds, left fibula osteomyelitis, HFrEF at last EF 45%,  chronic lymhedema, hypertension diabetes, and chronic kidney disease stage !!!b, who was admitted to [facility] on 09/24/2023 for Sepsis (HCC) [A41.9].  Acute Kidney Injury on chronic kidney disease stage IIIb with baseline creatinine 1.4 and GFR of 40 on 09/12/23.  Acute kidney injury secondary to sepsis from infectious process.  Creatinine was 2.75 on admission and has peaked today 3.81. Foley catheter in place. BUN elevated, unlikely to cause this level of confusion and somnolence. Patient with adequate urine output, no urgent need for dialysis. Would encourage palliative to continue to follow  and assist with GOC. Continue supportive measures and treatment of underlying cause.   2. Sepsis likely secondary to left fibular osteomyelitis. Reviewed by orthopedic surgery, no surgical intervention. Managing with wound care. Antibiotics completed.   3. Anemia of chronic kidney disease Lab Results  Component Value Date   HGB 10.3 (L) 09/27/2023    Hgb within desired range. Will continue to manage.   4. Diabetes mellitus type II with chronic kidney disease/renal manifestations: insulin dependent. Home regimen includes Lantus, novolog. Most recent hemoglobin A1c is 8.1 on 09/06/23.   5. Hypertension with chronic kidney disease. Home regimen includes Lantus today.  Amlodipine, clonidine, furosemide, irbesartan, and metoprolol. Currently prescribed Metoprolol only.   LOS: 3 Lathen Seal 4/2/20253:44 PM

## 2023-09-27 NOTE — Procedures (Addendum)
 Patient Name: [name]  MRN: [id number]  Epilepsy Attending: Charlsie Quest  Referring Physician/Provider: Gillis Santa, MD  Date: 09/27/2023 Duration: 23.05 mins  Patient history: 68yo F with ams. EEG to evaluate for seizure  Level of alertness: awake/ lethargic   AEDs during EEG study: None  Technical aspects: This EEG study was done with scalp electrodes positioned according to the 10-20 International system of electrode placement. Electrical activity was reviewed with band pass filter of 1-70Hz , sensitivity of 7 uV/mm, display speed of 31mm/sec with a 60Hz  notched filter applied as appropriate. EEG data were recorded continuously and digitally stored.  Video monitoring was available and reviewed as appropriate.  Description: EEG showed continuous generalized 3-5 Asatab and delta slowing. Generalized periodic discharges with triphasic morphology were noted at 1.5 to 2.5 Hz.  Photic driving was not seen during photic stimulation. Hyperventilation was not performed.     ABNORMALITY -Periodic discharges with triphasic morphology, generalized - Continuous slow, generalized  IMPRESSION: This study showed generalized periodic discharges with triphasic morphology.  These discharges can be on the ictal-interictal continuum.  However the frequency and morphology is most likely suggestive of toxic-metabolic causes like cefepime toxicity, renal dysfunction, hyperammonemia.  If concern for ictal-interictal activity persist, long-term EEG can be considered.  Additionally there is moderate diffuse encephalopathy.  No seizures were noted.  Sima Lindenberger Annabelle Harman

## 2023-09-27 NOTE — Progress Notes (Signed)
*  PRELIMINARY RESULTS* Echocardiogram 2D Echocardiogram has been performed.  [name] 09/27/2023, 10:54 AM

## 2023-09-27 NOTE — Progress Notes (Signed)
 [facility] Progress Note  Patient: Autumn [name]    NGE:[id number]  DOA: 09/24/2023     Date of Service: the patient was seen and examined on 09/27/2023  Chief Complaint  Patient presents with   Altered Mental Status   Brief hospital course:  Autumn [name] is a 68 y.o. female with medical history significant of Autumn bilateral lower extremity wounds with recent diagnosis of left fibula [name], Autumn with last EF of 45%, Autumn [name], Autumn [name], Autumn [name], Autumn [name], Autumn [name], Autumn presents to the ED due to altered mental status.   History obtained through chart review due to patient's altered mental status. Per facility staff at [facility], patient has become increasingly altered over the past 1 to 2 days and has been refusing to take her medications.  At baseline, she is alert and oriented x 4 with no underlying history of dementia.  No reports of fever.   Per chart review, patient was recently admitted on 09/04/2023 and was discovered to have a left fibula [name].  Cultures grew Staph aureus and Providencia.  She was discharged on cefepime and daptomycin with plans to continue IV antibiotics through October 19, 2023.   ED course: On arrival to the ED, patient was normotensive at 117/104 with heart rate of 90.  She was saturating at 100% on room air.  She was afebrile at 98.3.  Initial workup notable for hemoglobin of 10.9, platelets 415, bicarb 21, BUN 38, creatinine 2.75, and GFR of 18.  Lactic acid 2.7.  Urinalysis with ketonuria, proteinuria, few bacteria.  CT head with no acute intracranial abnormalities.  Chest x-ray with low lung volumes.  Patient started on Rocephin, vancomycin, IV fluids and pain medication.  [facility] contacted for admission.   Assessment and Plan:  # Toxic metabolic encephalopathy possible due to cefepime and opiates Patient presented with altered mental status ABG reviewed, no CO2 retention. Avoid sedating medications and  avoid opiates PCCM consulted, patient received 1 dose of Narcan with some improvement in the mental status Rule out DKA, encephalopathy could be due to cefepime toxicity ID consulted, recommended to stop daptomycin and cefepime, patient already treated for 12 weeks.  [name].  4/1 Narcan 1 dose given, patient started moaning but still no significant improvement in her mental status.   Insert NG tube, consulted nutritionist to start enteral feeding Continue IV fluid for hydration CT head was negative on admission,  4/2 MRI brain negative for any acute findings EEG pending    Paroxysmal A-fib with RVR developed on 4/1 in the morning S/p Cardizem 10 mg IV one-time dose given S/p Lopressor 5 mg IV x 2 doses given Started Lopressor IV 5 mg 3 times daily Monitor BP and heart rate, titrate medications accordingly. 4/2 TTE shows LVEF 60-65%, no WMA mild LV hypertrophy, grade 1 diastolic dysfunction.  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  Hyperkalemia resolved Accident order placed per patient is unable to take oral due to AMS Ordered D10, insulin 5 units and albuterol nebulizer Repeat BMP Continue to monitor on telemetry   # Metabolic acidosis due to  renal failure S/p Bicarbonate 150 mEq IV given on 3/31 S/p bicarbonate 50 mEq x 1 dose given on 4/1 4/2 bicarb 50 mEq one-time IV push given Monitor electrolytes   # IDDM T2  Continue NovoLog sliding scale Monitor CBG Start diabetic diet when patient is able to swallow Held gabapentin due to altered mental status   # Autumn systolic CHF and Autumn [name], Autumn [name] Held home medications amlodipine 5 mg, Lasix 20 mg BID, Toprol-XL 12.5 mg, irbesartan 150 mg, clonidine 0.3 mg p.o. 3 times daily, Plavix 75 mg, Held home meds due to altered mental status and sepsis Monitor BP and resume medications as per improvement  # Hypothyroid, TSH 0.1 very low and free T40.67 At lower and Patient was on Synthroid 200 mcg p.o. daily, decreased dose to 100 mcg p.o. daily when she will be able to swallow.  Overall prognosis is poor, patient remains at high risk to deteriorate and patient is full code. PCCM was consulted on 3/31 Palliative care consulted for goals of care discussion, patient has no immediate family, she has a friend so palliative will try to contact her and make final decisions.    Body mass index is 49.05 kg/m.  Interventions:  Pressure Injury 04/17/21 Sacrum Medial Stage 2 -  Partial thickness loss of dermis presenting as a shallow open injury with a red, pink wound bed without slough. (Active)  04/17/21 0444  Location: Sacrum  Location Orientation: Medial  Staging: Stage 2 -  Partial thickness loss of dermis presenting as a shallow open injury with a red, pink wound bed without slough.  Wound Description (Comments):   Present on Admission: Yes     Pressure Injury 04/17/21 Heel Left;Posterior Unstageable - Full thickness tissue loss in which the base of the injury is covered by slough (yellow, tan, gray, green or brown) and/or eschar (tan, brown or black) in the wound bed. (Active)  04/17/21   Location: Heel  Location Orientation: Left;Posterior  Staging: Unstageable - Full  thickness tissue loss in which the base of the injury is covered by slough (yellow, tan, gray, green or brown) and/or eschar (tan, brown or black) in the wound bed.  Wound Description (Comments):   Present on Admission: Yes     Diet: Carb modified diet when able to swallow DVT Prophylaxis: Subcutaneous Heparin    Advance goals of care discussion: Full code  Family Communication: family was not present at bedside, at the time of interview.  The pt provided permission to discuss medical plan with the family. Opportunity was given to ask question and all questions were answered satisfactorily.  4/1 management plan discussed with patient's friend at bedside.  Disposition:  Pt is from SNF, admitted with AMS due to cefepime toxicity, still has AMS, which precludes a safe discharge. Discharge to SNF, when stable, may need few days to improve. Prognosis is poor, patient may need hospice care.  Subjective: Patient remained obtunded, unresponsive, eyes are open, moaning but unable to offer any complaint and unable to follow commands.   Physical Exam: General: Lethargic and very somnolent/obtunded.   Eyes: open and some spontaneous movement noticed ENT: Oral Mucosa Clear, but dry Neck: no JVD,  Cardiovascular: S1 and S2 Present, no Murmur,  Respiratory: Equal air  entry bilaterally, decreased breath sounds due to altered mental status.  No significant crackles or wheezes Abdomen: Bowel Sound present, Soft and no tenderness,  Skin: Bilateral Autumn leg wounds, wrapped with dressing Extremities: 4+ Pedal edema, no calf tenderness Neurologic: Obtunded, very lethargic, not responsive, unable to follow commands Gait not checked due to patient safety concerns  Vitals:   09/27/23 1000 09/27/23 1100 09/27/23 1200 09/27/23 1300  BP: (!) 187/76 (!) 183/80 (!) 174/150 (!) 115/57  Pulse: (!) 126 (!) 117 (!) 117 79  Resp: (!) 23 17 15 14   Temp:      TempSrc:      SpO2:  100% 100% 100%  Weight:       Height:        Intake/Output Summary (Last 24 hours) at 09/27/2023 1426 Last data filed at 09/27/2023 0548 Gross per 24 hour  Intake 960.21 ml  Output 175 ml  Net 785.21 ml   Filed Weights   09/24/23 1211 09/26/23 0500 09/27/23 0529  Weight: 133 kg 129.5 kg 133.7 kg    Data Reviewed: I have personally reviewed and interpreted daily labs, tele strips, imagings as discussed above. I reviewed all nursing notes, pharmacy notes, vitals, pertinent old records I have discussed plan of care as described above with RN and patient/family.  CBC: Recent Labs  Lab 09/24/23 1214 09/25/23 0602 09/25/23 2328 09/26/23 0125 09/27/23 0151  WBC 9.8 17.6* 12.6* 11.9* 10.0  NEUTROABS 6.3 16.1* 10.3*  --   --   HGB 10.9* 10.1* 9.3* 9.8* 10.3*  HCT 36.1 33.4* 30.5* 31.6* 34.2*  MCV 90.3 92.0 90.2 89.3 88.8  PLT 415* 398 361 364 360   Basic Metabolic Panel: Recent Labs  Lab 09/25/23 0602 09/25/23 1351 09/25/23 2328 09/26/23 0125 09/27/23 0151  NA 139 140 140 141 140  K 5.8* 4.9 4.6 4.4 4.6  CL 106 107 109 110 106  CO2 19* 17* 19* 19* 19*  GLUCOSE 212* 264* 181* 170* 171*  BUN 42* 45* 45* 45* 50*  CREATININE 2.85* 3.23* 3.29* 3.33* 3.81*  CALCIUM 8.7* 8.7* 8.5* 8.4* 8.5*  MG  --  2.0  --  2.0 2.2  PHOS  --  5.9*  --  5.6* 5.7*    Studies: ECHOCARDIOGRAM COMPLETE Result Date: 09/27/2023    ECHOCARDIOGRAM REPORT   Patient Name:   RIONA LAHTI Date of Exam: 09/27/2023 Medical Rec #:  [id number]         Height:       65.0 in Accession #:    [id number]        Weight:       294.8 lb Date of Birth:  July 05, 1955          BSA:          2.333 m Patient Age:    68 years          BP:           166/86 mmHg Patient Gender: F                 HR:           108 bpm. Exam Location:  [facility] Procedure: 2D Echo, Cardiac Doppler, Color Doppler and Intracardiac            Opacification Agent (Both Spectral and Color Flow Doppler were            utilized during procedure). Indications:     CHF  History:  Patient has no prior history of Echocardiogram examinations.                  CHF, Signs/Symptoms:Edema; Risk Factors:Autumn [name] and                  [name].  Sonographer:     Mikki Harbor Referring Phys:  [id number] Andris Baumann Diagnosing Phys: Debbe Odea MD  Sonographer Comments: Technically difficult study due to poor echo windows and patient is obese. IMPRESSIONS  1. Left ventricular ejection fraction, by estimation, is 60 to 65%. The left ventricle has normal function. The left ventricle has no regional wall motion abnormalities. There is mild left ventricular hypertrophy. Left ventricular diastolic parameters are consistent with Grade I diastolic dysfunction (impaired relaxation).  2. Right ventricular systolic function is normal. The right ventricular size is normal.  3. The mitral valve is degenerative. Mild mitral valve regurgitation.  4. The aortic valve was not well visualized. Aortic valve regurgitation is not visualized. Aortic valve sclerosis/calcification is present, without any evidence of aortic stenosis. Aortic valve mean gradient measures 6.7 mmHg. FINDINGS  Left Ventricle: Left ventricular ejection fraction, by estimation, is 60 to 65%. The left ventricle has normal function. The left ventricle has no regional wall motion abnormalities. Definity contrast agent was given IV to delineate the left ventricular  endocardial borders. Strain was performed and the global longitudinal strain is indeterminate. The left ventricular internal cavity size was normal in size. There is mild left ventricular hypertrophy. Left ventricular diastolic parameters are consistent  with Grade I diastolic dysfunction (impaired relaxation). Right Ventricle: The right ventricular size is normal. No increase in right ventricular wall thickness. Right ventricular systolic function is normal. Left Atrium: Left atrial size was normal in size. Right Atrium: Right atrial size was normal in size. Pericardium: There  is no evidence of pericardial effusion. Mitral Valve: The mitral valve is degenerative in appearance. Mild mitral valve regurgitation. MV peak gradient, 13.2 mmHg. The mean mitral valve gradient is 8.0 mmHg. Tricuspid Valve: The tricuspid valve is normal in structure. Tricuspid valve regurgitation is not demonstrated. Aortic Valve: The aortic valve was not well visualized. Aortic valve regurgitation is not visualized. Aortic valve sclerosis/calcification is present, without any evidence of aortic stenosis. Aortic valve mean gradient measures 6.7 mmHg. Aortic valve peak gradient measures 12.3 mmHg. Aortic valve area, by VTI measures 2.76 cm. Pulmonic Valve: The pulmonic valve was normal in structure. Pulmonic valve regurgitation is not visualized. Aorta: The aortic root is normal in size and structure. IAS/Shunts: No atrial level shunt detected by color flow Doppler. Additional Comments: 3D was performed not requiring image post processing on an independent workstation and was indeterminate.  LEFT VENTRICLE PLAX 2D LVIDd:         4.40 cm   Diastology LVIDs:         1.90 cm   LV e' medial:    7.40 cm/s LV PW:         1.30 cm   LV E/e' medial:  16.8 LV IVS:        1.40 cm   LV e' lateral:   9.25 cm/s LVOT diam:     2.00 cm   LV E/e' lateral: 13.4 LV SV:         97 LV SV Index:   42 LVOT Area:     3.14 cm  RIGHT VENTRICLE RV Basal diam:  3.25 cm RV Mid diam:    2.60 cm RV S prime:  15.00 cm/s LEFT ATRIUM             Index        RIGHT ATRIUM           Index LA diam:        3.50 cm 1.50 cm/m   RA Area:     12.90 cm LA Vol (A2C):   56.1 ml 24.05 ml/m  RA Volume:   32.60 ml  13.98 ml/m LA Vol (A4C):   46.7 ml 20.02 ml/m LA Biplane Vol: 53.5 ml 22.94 ml/m  AORTIC VALVE                     PULMONIC VALVE AV Area (Vmax):    2.61 cm      PV Vmax:       1.43 m/s AV Area (Vmean):   2.66 cm      PV Peak grad:  8.2 mmHg AV Area (VTI):     2.76 cm AV Vmax:           175.67 cm/s AV Vmean:          120.667 cm/s AV VTI:             0.352 m AV Peak Grad:      12.3 mmHg AV Mean Grad:      6.7 mmHg LVOT Vmax:         146.00 cm/s LVOT Vmean:        102.000 cm/s LVOT VTI:          0.310 m LVOT/AV VTI ratio: 0.88  AORTA Ao Root diam: 3.30 cm Ao Asc diam:  3.40 cm MITRAL VALVE MV Area (PHT): 3.37 cm     SHUNTS MV Area VTI:   2.37 cm     Systemic VTI:  0.31 m MV Peak grad:  13.2 mmHg    Systemic Diam: 2.00 cm MV Mean grad:  8.0 mmHg MV Vmax:       1.82 m/s MV Vmean:      136.0 cm/s MV Decel Time: 225 msec MV E velocity: 124.00 cm/s MV A velocity: 148.00 cm/s MV E/A ratio:  0.84 Debbe Odea MD Electronically signed by Debbe Odea MD Signature Date/Time: 09/27/2023/12:52:04 PM    Final    MR BRAIN WO CONTRAST Result Date: 09/27/2023 CLINICAL DATA:  68 year old female with altered mental status. EXAM: MRI HEAD WITHOUT CONTRAST TECHNIQUE: Multiplanar, multiecho pulse sequences of the brain and surrounding structures were obtained without intravenous contrast. COMPARISON:  Head CT 09/24/2023 and earlier. FINDINGS: Brain: No restricted diffusion to suggest acute infarction. No midline shift, mass effect, evidence of mass lesion, ventriculomegaly, extra-axial collection or acute intracranial hemorrhage. Cervicomedullary junction and pituitary are within normal limits. Cerebral volume is within normal limits for age. Mild for age scattered white matter T2 and FLAIR hyperintensity primarily in the corona radiata. Similar heterogeneity in the pons, more so on the right. No cortical encephalomalacia. No Autumn cerebral blood products identified on motion degraded SWI. Deep gray nuclei and cerebellum appear normal. Vascular: Major intracranial vascular flow voids are preserved. Skull and upper cervical spine: Negative. Visualized bone marrow signal is within normal limits. Sinuses/Orbits: Negative. Other: Visible internal auditory structures appear normal. Negative visible scalp and face. IMPRESSION: 1. No acute intracranial abnormality.  2. Mild for age signal changes compatible with Autumn small vessel disease. Electronically Signed   By: Odessa Fleming M.D.   On: 09/27/2023 10:14   DG Abd 1 View Result Date: 09/26/2023  CLINICAL DATA:  NG placement. EXAM: ABDOMEN - 1 VIEW COMPARISON:  None Available. FINDINGS: Feeding tube with weighted tip in the left upper abdomen likely in the region of the gastric fundus. IMPRESSION: Feeding tube with tip in the gastric fundus. Electronically Signed   By: Elgie Collard M.D.   On: 09/26/2023 19:42    Scheduled Meds:  Chlorhexidine Gluconate Cloth  6 each Topical Daily   feeding supplement  237 mL Oral BID BM   feeding supplement (PROSource TF20)  60 mL Per Tube BID   free water  30 mL Per Tube Q4H   heparin injection (subcutaneous)  5,000 Units Subcutaneous Q8H   insulin aspart  0-15 Units Subcutaneous TID WC   insulin glargine-yfgn  15 Units Subcutaneous Daily   levothyroxine  100 mcg Oral Q0600   metoprolol tartrate  5 mg Intravenous Q6H   multivitamin with minerals  1 tablet Per Tube Daily   mouth rinse  15 mL Mouth Rinse 4 times per day   sodium chloride flush  3 mL Intravenous Q12H   sodium zirconium cyclosilicate  10 g Oral Once   thiamine  100 mg Per Tube Daily   Continuous Infusions:  sodium chloride 75 mL/hr at 09/27/23 0548   feeding supplement (OSMOLITE 1.5 CAL)      PRN Meds: acetaminophen **OR** acetaminophen, naLOXone (NARCAN)  injection, ondansetron (ZOFRAN) IV, mouth rinse  Time spent: 55 minutes  Author: Gillis Santa. MD [facility] 09/27/2023 2:26 PM  To reach On-call, see care teams to locate the attending and reach out to them via [email or web address]. If 7PM-7AM, please contact night-coverage If you still have difficulty reaching the attending provider, please page the [facility] (Director on Call) for [facility] on amion for assistance.

## 2023-09-27 NOTE — TOC Progression Note (Signed)
 Transition of Care [facility]) - Progression Note    Patient Details  Name: [name] MRN: [id number] Date of Birth: [birth year]  [facility]) CM/SW Contact  Truddie Hidden, RN Phone Number: 09/27/2023, 12:33 PM  Clinical Narrative:    Spoke with patient's friend [place]. She was advised the palliative team needed to identify a decision maker for the patient. Angus Seller stated she has been friends with the patient for patient for 65 years. Patient's last know living family members are her cousins. Vance Gather is unaware of any relatives names or whereabouts. She is willing to be the decision maker for patient. MD, palliative and primary nurse notified.         Expected Discharge Plan and Services                                               Social Determinants of Health (SDOH) Interventions SDOH Screenings   Food Insecurity: No Food Insecurity (09/25/2023)  Housing: Low Risk  (09/25/2023)  Transportation Needs: No Transportation Needs (09/25/2023)  Utilities: Not At Risk (09/25/2023)  Alcohol Screen: Low Risk  (03/10/2021)  Depression (PHQ2-9): Low Risk  (03/10/2021)  Financial Resource Strain: Low Risk  (03/10/2021)  Physical Activity: Inactive (03/10/2021)  Social Connections: Socially Isolated (09/25/2023)  Stress: No Stress Concern Present (03/10/2021)  Tobacco Use: Low Risk  (09/25/2023)    Readmission Risk Interventions    11/03/2021   12:41 PM  Readmission Risk Prevention Plan  Transportation Screening Complete  PCP or Specialist Appt within 3-5 Days Complete  HRI or Home Care Consult Complete  Social Work Consult for Recovery Care Planning/Counseling Complete  Palliative Care Screening Complete  Medication Review Oceanographer) Complete

## 2023-09-27 NOTE — Progress Notes (Signed)
 Date of Admission:  09/24/2023      ID: KEIDY THURGOOD is a 68 y.o. female Principal Problem:   Sepsis (HCC) Active Problems:   Essential hypertension   Hypothyroidism   Acute kidney injury superimposed on CKD (HCC)   PVD (peripheral vascular disease) (HCC)   Type II diabetes mellitus with renal manifestations (HCC)   Chronic systolic CHF (congestive heart failure) (HCC)   Osteomyelitis of left fibula (HCC)   Acute encephalopathy   Acute congestive heart failure (HCC)    Subjective: Pt remains obtunded   Medications:   Chlorhexidine Gluconate Cloth  6 each Topical Daily   feeding supplement  237 mL Oral BID BM   heparin injection (subcutaneous)  5,000 Units Subcutaneous Q8H   insulin aspart  0-15 Units Subcutaneous TID WC   insulin glargine-yfgn  15 Units Subcutaneous Daily   levothyroxine  100 mcg Oral Q0600   metoprolol tartrate  5 mg Intravenous Q6H   mouth rinse  15 mL Mouth Rinse 4 times per day   sodium chloride flush  3 mL Intravenous Q12H   sodium zirconium cyclosilicate  10 g Oral Once    Objective: Vital signs in last 24 hours: Patient Vitals for the past 24 hrs:  BP Temp Temp src Pulse Resp SpO2 Weight  09/27/23 1300 (!) 115/57 -- -- 79 14 100 % --  09/27/23 1200 (!) 174/150 -- -- (!) 117 15 100 % --  09/27/23 1100 (!) 183/80 -- -- (!) 117 17 100 % --  09/27/23 1000 (!) 187/76 -- -- (!) 126 (!) 23 -- --  09/27/23 0800 (!) 166/86 -- -- (!) 107 16 100 % --  09/27/23 0529 -- -- -- -- -- -- 133.7 kg  09/27/23 0400 -- 97.8 F (36.6 C) Oral -- -- -- --  09/27/23 0321 -- 97.8 F (36.6 C) -- -- -- -- --  09/26/23 2331 -- 97.8 F (36.6 C) -- -- -- -- --  09/26/23 2016 -- 98.8 F (37.1 C) -- -- -- -- --  09/26/23 1921 (!) 157/86 -- -- (!) 110 15 99 % --  09/26/23 1745 (!) 153/76 -- -- (!) 106 15 99 % --  09/26/23 1715 (!) 155/140 -- -- (!) 124 18 99 % --  09/26/23 1700 (!) 148/134 -- -- (!) 117 20 99 % --  09/26/23 1645 (!) 159/84 -- -- (!) 129 16 99 %  --  09/26/23 1630 (!) 156/71 98.8 F (37.1 C) Axillary (!) 123 19 98 % --  09/26/23 1600 (!) 166/70 -- -- (!) 116 15 98 % --  09/26/23 1530 (!) 170/73 -- -- (!) 117 15 98 % --  09/26/23 1500 (!) 177/61 -- -- (!) 117 20 97 % --  09/26/23 1430 (!) 142/72 -- -- 93 15 97 % --      PHYSICAL EXAM:  General: obtunded No distress  PERL Neck: Supple, symmetrical, no adenopathy, thyroid: non tender no carotid bruit and no JVD. Lungs: Clear to auscultation bilaterally. No Wheezing or Rhonchi. No rales. Heart: Regular rate and rhythm, no murmur, rub or gallop. Abdomen: Soft, non-tender,not distended. Bowel sounds normal. No masses Extremities: b/l leg wounds Skin: No rashes or lesions. Or bruising Lymph: Cervical, supraclavicular normal. Neurologic: cannot assess  Lab Results    Latest Ref Rng & Units 09/27/2023    1:51 AM 09/26/2023    1:25 AM 09/25/2023   11:28 PM  CBC  WBC 4.0 - 10.5 K/uL 10.0  11.9  12.6  Hemoglobin 12.0 - 15.0 g/dL 40.3  9.8  9.3   Hematocrit 36.0 - 46.0 % 34.2  31.6  30.5   Platelets 150 - 400 K/uL 360  364  361        Latest Ref Rng & Units 09/27/2023    1:51 AM 09/26/2023    1:25 AM 09/25/2023   11:28 PM  CMP  Glucose 70 - 99 mg/dL 474  259  563   BUN 8 - 23 mg/dL 50  45  45   Creatinine 0.44 - 1.00 mg/dL 8.75  6.43  3.29   Sodium 135 - 145 mmol/L 140  141  140   Potassium 3.5 - 5.1 mmol/L 4.6  4.4  4.6   Chloride 98 - 111 mmol/L 106  110  109   CO2 22 - 32 mmol/L 19  19  19    Calcium 8.9 - 10.3 mg/dL 8.5  8.4  8.5   Total Protein 6.5 - 8.1 g/dL   6.6   Total Bilirubin 0.0 - 1.2 mg/dL   0.7   Alkaline Phos 38 - 126 U/L   62   AST 15 - 41 U/L   19   ALT 0 - 44 U/L   11       Microbiology: [facility]  Studies/Results: ECHOCARDIOGRAM COMPLETE Result Date: 09/27/2023    ECHOCARDIOGRAM REPORT   Patient Name:   SYAN CULLIMORE Date of Exam: 09/27/2023 Medical Rec #:  [id number]         Height:       65.0 in Accession #:    [id number]        Weight:       294.8 lb  Date of Birth:  22-Oct-1955          BSA:          2.333 m Patient Age:    68 years          BP:           166/86 mmHg Patient Gender: F                 HR:           108 bpm. Exam Location:  [facility] Procedure: 2D Echo, Cardiac Doppler, Color Doppler and Intracardiac            Opacification Agent (Both Spectral and Color Flow Doppler were            utilized during procedure). Indications:     CHF  History:         Patient has no prior history of Echocardiogram examinations.                  CHF, Signs/Symptoms:Edema; Risk Factors:Hypertension and                  Diabetes.  Sonographer:     Mikki Harbor Referring Phys:  [id number] Andris Baumann Diagnosing Phys: Debbe Odea MD  Sonographer Comments: Technically difficult study due to poor echo windows and patient is obese. IMPRESSIONS  1. Left ventricular ejection fraction, by estimation, is 60 to 65%. The left ventricle has normal function. The left ventricle has no regional wall motion abnormalities. There is mild left ventricular hypertrophy. Left ventricular diastolic parameters are consistent with Grade I diastolic dysfunction (impaired relaxation).  2. Right ventricular systolic function is normal. The right ventricular size is normal.  3. The mitral valve is degenerative. Mild mitral valve regurgitation.  4. The  aortic valve was not well visualized. Aortic valve regurgitation is not visualized. Aortic valve sclerosis/calcification is present, without any evidence of aortic stenosis. Aortic valve mean gradient measures 6.7 mmHg. FINDINGS  Left Ventricle: Left ventricular ejection fraction, by estimation, is 60 to 65%. The left ventricle has normal function. The left ventricle has no regional wall motion abnormalities. Definity contrast agent was given IV to delineate the left ventricular  endocardial borders. Strain was performed and the global longitudinal strain is indeterminate. The left ventricular internal cavity size was normal in size. There is mild  left ventricular hypertrophy. Left ventricular diastolic parameters are consistent  with Grade I diastolic dysfunction (impaired relaxation). Right Ventricle: The right ventricular size is normal. No increase in right ventricular wall thickness. Right ventricular systolic function is normal. Left Atrium: Left atrial size was normal in size. Right Atrium: Right atrial size was normal in size. Pericardium: There is no evidence of pericardial effusion. Mitral Valve: The mitral valve is degenerative in appearance. Mild mitral valve regurgitation. MV peak gradient, 13.2 mmHg. The mean mitral valve gradient is 8.0 mmHg. Tricuspid Valve: The tricuspid valve is normal in structure. Tricuspid valve regurgitation is not demonstrated. Aortic Valve: The aortic valve was not well visualized. Aortic valve regurgitation is not visualized. Aortic valve sclerosis/calcification is present, without any evidence of aortic stenosis. Aortic valve mean gradient measures 6.7 mmHg. Aortic valve peak gradient measures 12.3 mmHg. Aortic valve area, by VTI measures 2.76 cm. Pulmonic Valve: The pulmonic valve was normal in structure. Pulmonic valve regurgitation is not visualized. Aorta: The aortic root is normal in size and structure. IAS/Shunts: No atrial level shunt detected by color flow Doppler. Additional Comments: 3D was performed not requiring image post processing on an independent workstation and was indeterminate.  LEFT VENTRICLE PLAX 2D LVIDd:         4.40 cm   Diastology LVIDs:         1.90 cm   LV e' medial:    7.40 cm/s LV PW:         1.30 cm   LV E/e' medial:  16.8 LV IVS:        1.40 cm   LV e' lateral:   9.25 cm/s LVOT diam:     2.00 cm   LV E/e' lateral: 13.4 LV SV:         97 LV SV Index:   42 LVOT Area:     3.14 cm  RIGHT VENTRICLE RV Basal diam:  3.25 cm RV Mid diam:    2.60 cm RV S prime:     15.00 cm/s LEFT ATRIUM             Index        RIGHT ATRIUM           Index LA diam:        3.50 cm 1.50 cm/m   RA Area:      12.90 cm LA Vol (A2C):   56.1 ml 24.05 ml/m  RA Volume:   32.60 ml  13.98 ml/m LA Vol (A4C):   46.7 ml 20.02 ml/m LA Biplane Vol: 53.5 ml 22.94 ml/m  AORTIC VALVE                     PULMONIC VALVE AV Area (Vmax):    2.61 cm      PV Vmax:       1.43 m/s AV Area (Vmean):   2.66 cm  PV Peak grad:  8.2 mmHg AV Area (VTI):     2.76 cm AV Vmax:           175.67 cm/s AV Vmean:          120.667 cm/s AV VTI:            0.352 m AV Peak Grad:      12.3 mmHg AV Mean Grad:      6.7 mmHg LVOT Vmax:         146.00 cm/s LVOT Vmean:        102.000 cm/s LVOT VTI:          0.310 m LVOT/AV VTI ratio: 0.88  AORTA Ao Root diam: 3.30 cm Ao Asc diam:  3.40 cm MITRAL VALVE MV Area (PHT): 3.37 cm     SHUNTS MV Area VTI:   2.37 cm     Systemic VTI:  0.31 m MV Peak grad:  13.2 mmHg    Systemic Diam: 2.00 cm MV Mean grad:  8.0 mmHg MV Vmax:       1.82 m/s MV Vmean:      136.0 cm/s MV Decel Time: 225 msec MV E velocity: 124.00 cm/s MV A velocity: 148.00 cm/s MV E/A ratio:  0.84 Debbe Odea MD Electronically signed by Debbe Odea MD Signature Date/Time: 09/27/2023/12:52:04 PM    Final    MR BRAIN WO CONTRAST Result Date: 09/27/2023 CLINICAL DATA:  68 year old female with altered mental status. EXAM: MRI HEAD WITHOUT CONTRAST TECHNIQUE: Multiplanar, multiecho pulse sequences of the brain and surrounding structures were obtained without intravenous contrast. COMPARISON:  Head CT 09/24/2023 and earlier. FINDINGS: Brain: No restricted diffusion to suggest acute infarction. No midline shift, mass effect, evidence of mass lesion, ventriculomegaly, extra-axial collection or acute intracranial hemorrhage. Cervicomedullary junction and pituitary are within normal limits. Cerebral volume is within normal limits for age. Mild for age scattered white matter T2 and FLAIR hyperintensity primarily in the corona radiata. Similar heterogeneity in the pons, more so on the right. No cortical encephalomalacia. No chronic cerebral blood  products identified on motion degraded SWI. Deep gray nuclei and cerebellum appear normal. Vascular: Major intracranial vascular flow voids are preserved. Skull and upper cervical spine: Negative. Visualized bone marrow signal is within normal limits. Sinuses/Orbits: Negative. Other: Visible internal auditory structures appear normal. Negative visible scalp and face. IMPRESSION: 1. No acute intracranial abnormality. 2. Mild for age signal changes compatible with chronic small vessel disease. Electronically Signed   By: Odessa Fleming M.D.   On: 09/27/2023 10:14   DG Abd 1 View Result Date: 09/26/2023 CLINICAL DATA:  NG placement. EXAM: ABDOMEN - 1 VIEW COMPARISON:  None Available. FINDINGS: Feeding tube with weighted tip in the left upper abdomen likely in the region of the gastric fundus. IMPRESSION: Feeding tube with tip in the gastric fundus. Electronically Signed   By: Elgie Collard M.D.   On: 09/26/2023 19:42   DG Chest Port 1 View Result Date: 09/25/2023 CLINICAL DATA:  Sepsis EXAM: PORTABLE CHEST 1 VIEW COMPARISON:  09/24/2023 FINDINGS: Right PICC line remains in place, unchanged. Heart and mediastinal contours are within normal limits. Low lung volumes with bibasilar atelectasis and vascular congestion. No effusions. No acute bony abnormality. IMPRESSION: Low lung volumes with bibasilar atelectasis and vascular congestion. Electronically Signed   By: Charlett Nose M.D.   On: 09/25/2023 23:14     Assessment/Plan: 68 y.o. 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 She presents from SNF with altered mental status and not eating and drinking for a few days? ? ?   Acute metabolic  encephalopathy- due to AKI on CKD and CNS effect of opioids and cefepime causing encephalopathy, myoclonus. She received narcan with not much of a change This does not look like sepsis clinically esepcially she was  on broad spectrum antibiotics  will hold antibiotics till her mental status clears She may not need Iv antibiotics for the osteo as it is unlikely to heal because of her immobility and pressure wounds MRI Okay  EEG pending Also has had high BP- PRES will be a concern but MRI without contrast does not show that   AKI on CKD- worsening   Paroxysmal afib   Obesity   Bed bound status    B/l venous wounds legs  Hypothyroidism     Discussed the management with hospitalist

## 2023-09-27 NOTE — Progress Notes (Signed)
 Pr h Para March. Permission to use dobhoff.

## 2023-09-28 DIAGNOSIS — N179 Acute kidney failure, unspecified: Secondary | ICD-10-CM | POA: Diagnosis not present

## 2023-09-28 DIAGNOSIS — G9341 Metabolic encephalopathy: Secondary | ICD-10-CM | POA: Diagnosis not present

## 2023-09-28 DIAGNOSIS — G253 Myoclonus: Secondary | ICD-10-CM | POA: Diagnosis not present

## 2023-09-28 DIAGNOSIS — R6520 Severe sepsis without septic shock: Secondary | ICD-10-CM | POA: Diagnosis not present

## 2023-09-28 DIAGNOSIS — N189 Chronic kidney disease, unspecified: Secondary | ICD-10-CM | POA: Diagnosis not present

## 2023-09-28 DIAGNOSIS — G9340 Encephalopathy, unspecified: Secondary | ICD-10-CM | POA: Diagnosis not present

## 2023-09-28 DIAGNOSIS — A419 Sepsis, unspecified organism: Secondary | ICD-10-CM | POA: Diagnosis not present

## 2023-09-28 DIAGNOSIS — R4182 Altered mental status, unspecified: Secondary | ICD-10-CM | POA: Diagnosis not present

## 2023-09-28 DIAGNOSIS — M86162 Other acute osteomyelitis, left tibia and fibula: Secondary | ICD-10-CM | POA: Diagnosis not present

## 2023-09-28 LAB — BLOOD GAS, VENOUS
Acid-base deficit: 9.4 mmol/L — ABNORMAL HIGH (ref 0.0–2.0)
Bicarbonate: 18.4 mmol/L — ABNORMAL LOW (ref 20.0–28.0)
Patient temperature: 37
Patient temperature: 37 %
pCO2, Ven: 46 mmHg (ref 44–60)
pH, Ven: 7.21 — ABNORMAL LOW (ref 7.25–7.43)
pO2, Ven: 53 mmHg — ABNORMAL HIGH (ref 32–45)

## 2023-09-28 LAB — CBC WITH DIFFERENTIAL/PLATELET
Abs Immature Granulocytes: 0.07 10*3/uL (ref 0.00–0.07)
Basophils Absolute: 0.1 10*3/uL (ref 0.0–0.1)
Basophils Relative: 1 %
Eosinophils Absolute: 0.2 10*3/uL (ref 0.0–0.5)
Eosinophils Relative: 2 %
HCT: 32.2 % — ABNORMAL LOW (ref 36.0–46.0)
Hemoglobin: 9.6 g/dL — ABNORMAL LOW (ref 12.0–15.0)
Immature Granulocytes: 1 %
Lymphocytes Relative: 11 %
Lymphs Abs: 1 10*3/uL (ref 0.7–4.0)
MCH: 27.7 pg (ref 26.0–34.0)
MCHC: 29.8 g/dL — ABNORMAL LOW (ref 30.0–36.0)
MCV: 92.8 fL (ref 80.0–100.0)
Monocytes Absolute: 0.8 10*3/uL (ref 0.1–1.0)
Monocytes Relative: 8 %
Neutro Abs: 7.4 10*3/uL (ref 1.7–7.7)
Neutrophils Relative %: 77 %
Platelets: 317 10*3/uL (ref 150–400)
RBC: 3.47 MIL/uL — ABNORMAL LOW (ref 3.87–5.11)
RDW: 19.4 % — ABNORMAL HIGH (ref 11.5–15.5)
WBC: 9.6 10*3/uL (ref 4.0–10.5)
nRBC: 0 % (ref 0.0–0.2)

## 2023-09-28 LAB — BASIC METABOLIC PANEL WITH GFR
Anion gap: 12 (ref 5–15)
BUN: 50 mg/dL — ABNORMAL HIGH (ref 8–23)
CO2: 22 mmol/L (ref 22–32)
Calcium: 8.2 mg/dL — ABNORMAL LOW (ref 8.9–10.3)
Chloride: 111 mmol/L (ref 98–111)
Creatinine, Ser: 4.02 mg/dL — ABNORMAL HIGH (ref 0.44–1.00)
GFR, Estimated: 11 mL/min — ABNORMAL LOW (ref >60)
Glucose, Bld: 85 mg/dL (ref 70–99)
Potassium: 4.4 mmol/L (ref 3.5–5.1)
Sodium: 145 mmol/L (ref 135–145)

## 2023-09-28 LAB — GLUCOSE, CAPILLARY
Glucose-Capillary: 107 mg/dL — ABNORMAL HIGH (ref 70–99)
Glucose-Capillary: 108 mg/dL — ABNORMAL HIGH (ref 70–99)
Glucose-Capillary: 124 mg/dL — ABNORMAL HIGH (ref 70–99)
Glucose-Capillary: 124 mg/dL — ABNORMAL HIGH (ref 70–99)
Glucose-Capillary: 130 mg/dL — ABNORMAL HIGH (ref 70–99)
Glucose-Capillary: 140 mg/dL — ABNORMAL HIGH (ref 70–99)
Glucose-Capillary: 78 mg/dL (ref 70–99)

## 2023-09-28 LAB — CBC
HCT: 31 % — ABNORMAL LOW (ref 36.0–46.0)
Hemoglobin: 9.5 g/dL — ABNORMAL LOW (ref 12.0–15.0)
MCH: 27.4 pg (ref 26.0–34.0)
MCHC: 30.6 g/dL (ref 30.0–36.0)
MCV: 89.3 fL (ref 80.0–100.0)
Platelets: 317 10*3/uL (ref 150–400)
RBC: 3.47 MIL/uL — ABNORMAL LOW (ref 3.87–5.11)
RDW: 19.4 % — ABNORMAL HIGH (ref 11.5–15.5)
WBC: 9.8 10*3/uL (ref 4.0–10.5)
nRBC: 0 % (ref 0.0–0.2)

## 2023-09-28 LAB — PHOSPHORUS: Phosphorus: 5.7 mg/dL — ABNORMAL HIGH (ref 2.5–4.6)

## 2023-09-28 LAB — MAGNESIUM: Magnesium: 2.1 mg/dL (ref 1.7–2.4)

## 2023-09-28 LAB — CK: Total CK: 53 U/L (ref 38–234)

## 2023-09-28 MED ORDER — HYDROMORPHONE HCL 1 MG/ML IJ SOLN
0.5000 mg | INTRAMUSCULAR | Status: DC | PRN
  Administered 2023-09-28 (×2): 0.5 mg via INTRAVENOUS
  Filled 2023-09-28 (×2): qty 0.5

## 2023-09-28 MED ORDER — ACETAMINOPHEN 160 MG/5ML PO SOLN
650.0000 mg | Freq: Three times a day (TID) | ORAL | Status: DC
  Administered 2023-09-28 (×2): 650 mg
  Filled 2023-09-28 (×4): qty 20.3

## 2023-09-28 NOTE — Progress Notes (Addendum)
 [facility]  ROUNDING NOTE   Subjective:   Patient seen laying in bed No family present Moaning loudly Alert, makes eye contact when name is called Unable to answer simple questions.   Creatinine 4.02 ( 3.81) Urine output overnight Osmolite 77ml/hr  Objective:  Vital signs in last 24 hours:  Temp:  [98 F (36.7 C)-98.2 F (36.8 C)] 98.2 F (36.8 C) (04/03 0000) Pulse Rate:  [79-117] 103 (04/03 0800) Resp:  [7-19] 19 (04/03 0800) BP: (115-174)/(55-150) 158/80 (04/03 0800) SpO2:  [100 %] 100 % (04/03 0800) Weight:  [131.9 kg] 131.9 kg (04/03 0500)  Weight change: -1.8 kg Filed Weights   09/26/23 0500 09/27/23 0529 09/28/23 0500  Weight: 129.5 kg 133.7 kg 131.9 kg    Intake/Output: I/O last 3 completed shifts: In: 883.9 [I.V.:883.9] Out: 500 [Urine:500]   Intake/Output this shift:  Total I/O In: 6.3 [NG/GT:6.3] Out: -   Physical Exam: General: NAD, ill appearing   Head: Normocephalic, atraumatic. Dry oral mucosa  Eyes: Anicteric  Lungs:  Clear to auscultation  Heart: Regular rhythm, tachycardia   Abdomen:  Soft, nontender,   Extremities:  1+ peripheral edema.  Neurologic: Alert  Skin: No lesions  Access: None  GU Foley  Basic Metabolic Panel: Recent Labs  Lab 09/25/23 1351 09/25/23 2328 09/26/23 0125 09/27/23 0151 09/28/23 0435  NA 140 140 141 140 145  K 4.9 4.6 4.4 4.6 4.4  CL 107 109 110 106 111  CO2 17* 19* 19* 19* 22  GLUCOSE 264* 181* 170* 171* 85  BUN 45* 45* 45* 50* 50*  CREATININE 3.23* 3.29* 3.33* 3.81* 4.02*  CALCIUM 8.7* 8.5* 8.4* 8.5* 8.2*  MG 2.0  --  2.0 2.2 2.1  PHOS 5.9*  --  5.6* 5.7* 5.7*    Liver Function Tests: Recent Labs  Lab 09/24/23 1214 09/25/23 0602 09/25/23 2328  AST 23 17 19   ALT 14 12 11   ALKPHOS 76 69 62  BILITOT 0.6 0.6 0.7  PROT 7.4 7.1 6.6  ALBUMIN 2.5* 2.2* 2.2*   No results for input(s): "LIPASE", "AMYLASE" in the last 168 hours. Recent Labs  Lab 09/27/23 0859  AMMONIA 16     CBC: Recent Labs  Lab 09/24/23 1214 09/25/23 0602 09/25/23 2328 09/26/23 0125 09/27/23 0151 09/28/23 0435  WBC 9.8 17.6* 12.6* 11.9* 10.0 9.8  NEUTROABS 6.3 16.1* 10.3*  --   --   --   HGB 10.9* 10.1* 9.3* 9.8* 10.3* 9.5*  HCT 36.1 33.4* 30.5* 31.6* 34.2* 31.0*  MCV 90.3 92.0 90.2 89.3 88.8 89.3  PLT 415* 398 361 364 360 317    Cardiac Enzymes: Recent Labs  Lab 09/25/23 0602  CKTOTAL 36*    BNP: Invalid input(s): "POCBNP"  CBG: Recent Labs  Lab 09/27/23 1127 09/27/23 1746 09/27/23 2015 09/28/23 0526 09/28/23 0857  GLUCAP 98 91 85 78 107*    Microbiology: Results for orders placed or performed during the hospital encounter of 09/24/23  Resp panel by RT-PCR (RSV, Flu A&B, Covid) Anterior Nasal Swab     Status: None   Collection Time: 09/24/23 12:21 PM   Specimen: Anterior Nasal Swab  Result Value Ref Range Status   SARS Coronavirus 2 by RT PCR NEGATIVE NEGATIVE Final    Comment: (NOTE) SARS-CoV-2 target nucleic acids are NOT DETECTED.  The SARS-CoV-2 RNA is generally detectable in upper respiratory specimens during the acute phase of infection. The lowest concentration of SARS-CoV-2 viral copies this assay can detect is 138 copies/mL. A negative  result does not preclude SARS-Cov-2 infection and should not be used as the sole basis for treatment or other patient management decisions. A negative result may occur with  improper specimen collection/handling, submission of specimen other than nasopharyngeal swab, presence of viral mutation(s) within the areas targeted by this assay, and inadequate number of viral copies(<138 copies/mL). A negative result must be combined with clinical observations, patient history, and epidemiological information. The expected result is Negative.  Fact Sheet for Patients:  BloggerCourse.com  Fact Sheet for Healthcare Providers:  SeriousBroker.it  This test is no t yet  approved or cleared by the [place] FDA and  has been authorized for detection and/or diagnosis of SARS-CoV-2 by FDA under an Emergency Use Authorization (EUA). This EUA will remain  in effect (meaning this test can be used) for the duration of the COVID-19 declaration under Section 564(b)(1) of the Act, 21 U.S.C.section 360bbb-3(b)(1), unless the authorization is terminated  or revoked sooner.       Influenza A by PCR NEGATIVE NEGATIVE Final   Influenza B by PCR NEGATIVE NEGATIVE Final    Comment: (NOTE) The Xpert Xpress SARS-CoV-2/FLU/RSV plus assay is intended as an aid in the diagnosis of influenza from Nasopharyngeal swab specimens and should not be used as a sole basis for treatment. Nasal washings and aspirates are unacceptable for Xpert Xpress SARS-CoV-2/FLU/RSV testing.  Fact Sheet for Patients: BloggerCourse.com  Fact Sheet for Healthcare Providers: SeriousBroker.it  This test is not yet approved or cleared by the [place] FDA and has been authorized for detection and/or diagnosis of SARS-CoV-2 by FDA under an Emergency Use Authorization (EUA). This EUA will remain in effect (meaning this test can be used) for the duration of the COVID-19 declaration under Section 564(b)(1) of the Act, 21 U.S.C. section 360bbb-3(b)(1), unless the authorization is terminated or revoked.     Resp Syncytial Virus by PCR NEGATIVE NEGATIVE Final    Comment: (NOTE) Fact Sheet for Patients: BloggerCourse.com  Fact Sheet for Healthcare Providers: SeriousBroker.it  This test is not yet approved or cleared by the [place] FDA and has been authorized for detection and/or diagnosis of SARS-CoV-2 by FDA under an Emergency Use Authorization (EUA). This EUA will remain in effect (meaning this test can be used) for the duration of the COVID-19 declaration under Section 564(b)(1) of  the Act, 21 U.S.C. section 360bbb-3(b)(1), unless the authorization is terminated or revoked.  Performed at [facility], [address], [place], [place] [address]   Urine Culture     Status: None   Collection Time: 09/24/23 12:21 PM   Specimen: Urine, Random  Result Value Ref Range Status   Specimen Description   Final    URINE, RANDOM Performed at [facility], [address] [place], [place] [address]    Special Requests   Final    NONE Reflexed from [id number] Performed at [facility], [address] [place], [place] [address]    Culture   Final    NO GROWTH Performed at [facility], [address], [place], [place] [address]    Report Status 09/25/2023 FINAL  Final  Blood Culture (routine x 2)     Status: None (Preliminary result)   Collection Time: 09/24/23 12:22 PM   Specimen: BLOOD LEFT FOREARM  Result Value Ref Range Status   Specimen Description BLOOD LEFT FOREARM  Final   Special Requests   Final    BOTTLES DRAWN AEROBIC ONLY Blood Culture results may not be  optimal due to an inadequate volume of blood received in culture bottles   Culture   Final    NO GROWTH 4 DAYS Performed at [facility], [address] [place], [place] [address]    Report Status PENDING  Incomplete  Blood Culture (routine x 2)     Status: None (Preliminary result)   Collection Time: 09/24/23 12:23 PM   Specimen: Left Antecubital; Blood  Result Value Ref Range Status   Specimen Description LEFT ANTECUBITAL  Final   Special Requests   Final    BOTTLES DRAWN AEROBIC AND ANAEROBIC Blood Culture adequate volume   Culture   Final    NO GROWTH 4 DAYS Performed at [facility], [address], [place], [place] [address]    Report Status PENDING  Incomplete  MRSA Next Gen by PCR, Nasal     Status: None   Collection Time: 09/25/23  8:44 PM   Specimen: Nasal Mucosa; Nasal Swab  Result Value Ref Range Status   MRSA by PCR Next Gen NOT  DETECTED NOT DETECTED Final    Comment: (NOTE) The GeneXpert MRSA Assay (FDA approved for NASAL specimens only), is one component of a comprehensive MRSA colonization surveillance program. It is not intended to diagnose MRSA infection nor to guide or monitor treatment for MRSA infections. Test performance is not FDA approved in patients less than 51 years old. Performed at [facility], [address] [place], [place] [address]     Coagulation Studies: Recent Labs    09/25/23 2328  LABPROT 17.8*  INR 1.4*    Urinalysis: No results for input(s): "COLORURINE", "LABSPEC", "PHURINE", "GLUCOSEU", "HGBUR", "BILIRUBINUR", "KETONESUR", "PROTEINUR", "UROBILINOGEN", "NITRITE", "LEUKOCYTESUR" in the last 72 hours.  Invalid input(s): "APPERANCEUR"    Imaging: CT ABDOMEN PELVIS WO CONTRAST Result Date: 09/27/2023 CLINICAL DATA:  Sepsis and abdominal pain. Suspect small bowel obstruction. EXAM: CT ABDOMEN AND PELVIS WITHOUT CONTRAST TECHNIQUE: Multidetector CT imaging of the abdomen and pelvis was performed following the standard protocol without IV contrast following administration of oral contrast only. RADIATION DOSE REDUCTION: This exam was performed according to the departmental dose-optimization program which includes automated exposure control, adjustment of the mA and/or kV according to patient size and/or use of iterative reconstruction technique. COMPARISON:  CT without contrast 04/02/2022, CT with IV contrast 10/30/2021. FINDINGS: Lower chest: The cardiac size is normal. The mitral ring is heavily calcified. There are three-vessel coronary calcifications. There is no pericardial effusion. There is patchy airspace consolidation throughout the lung fields most likely due to multifocal pneumonia. Trace bilateral pleural effusions. A Dobbhoff feeding tube extends to the left and posteriorly within the stomach with the tip in the proximal lumen. Hepatobiliary: There is loss of fine  detail due to respiratory motion. The liver is steatotic. No mass is seen through motion artifact. There are several rim calcified gallstones largest is 1.4 cm. No wall thickening or biliary dilatation. Pancreas: Partially atrophic. Otherwise unremarkable without contrast. Spleen: No abnormality is seen through the breathing motion. Adrenals/Urinary Tract: There is no adrenal mass. No new contour deforming renal abnormality is seen through the motion artifacts. There are Bosniak 1 cysts in the right kidney, in the upper pole measuring 3.2 cm and 18 Hounsfield units, in the lower pole measuring 1.4 cm and 1.8 Hounsfield units. No follow-up imaging is recommended. The unenhanced kidneys are otherwise unremarkable. There is no urinary stone or obstruction. The bladder is catheterized, contracted and obscured by metal spray artifact from a right hip replacement.  Stomach/Bowel: No dilatation or wall thickening. No bowel obstruction or inflammation is seen. Normal subcecal appendix. Scattered sigmoid diverticulosis without evidence of diverticulitis. Vascular/Lymphatic: Aortic atherosclerosis. No enlarged abdominal or pelvic lymph nodes. Reproductive: Uterus and bilateral adnexa are unremarkable. Other: There is evidence of recent injections into the subcutaneous abdominal wall fat. Bilateral stranding noted on the right greater than left in the redundant fatty soft tissues of the left and right abdominal wall, could be due to cellulitis or third spacing of fluid reserves and does not appear to have been present previously. Traumatic etiology could explain this in the appropriate clinical setting. There is trace presacral ascites. No other free fluid. There is no free hemorrhage, free air, or localizing inflammatory process. Musculoskeletal: Right hip arthroplasty. Osteopenia with advanced degenerative change of the spine. Left hip DJD. Multilevel acquired lumbar spinal stenosis. Multilevel foraminal stenosis. No acute or  other significant osseous findings. IMPRESSION: 1. Patchy airspace consolidation throughout the lung fields most likely due to multifocal pneumonia. 2. No bowel obstruction or inflammation. 3. Trace bilateral pleural effusions.  Trace presacral ascites. 4. Dobbhoff feeding tube tip in the proximal stomach. 5. Hepatic steatosis.  Cholelithiasis. 6. Diverticulosis without evidence of diverticulitis. 7. Bilateral stranding in the redundant superficial fatty soft tissues of the left and right abdominal wall, could be due to cellulitis or third spacing of fluid reserves. Traumatic etiology could explain this in the appropriate clinical setting. 8. Aortic and coronary artery atherosclerosis. 9. Osteopenia and advanced degenerative change of the spine. Electronically Signed   By: Almira Bar M.D.   On: 09/27/2023 21:20   EEG adult Result Date: 09/27/2023 Autumn Quest, MD     09/27/2023  4:31 PM Patient Name: [name] MRN: [id number] Epilepsy Attending: Charlsie [name] Referring Physician/Provider: Gillis Santa, MD Date: 09/27/2023 Duration: 23.05 mins Patient history: 68yo F with ams. EEG to evaluate for seizure Level of alertness: awake/ lethargic AEDs during EEG study: None Technical aspects: This EEG study was done with scalp electrodes positioned according to the 10-20 International system of electrode placement. Electrical activity was reviewed with band pass filter of 1-70Hz , sensitivity of 7 uV/mm, display speed of 51mm/sec with a 60Hz  notched filter applied as appropriate. EEG data were recorded continuously and digitally stored.  Video monitoring was available and reviewed as appropriate. Description: EEG showed continuous generalized 3-5 Asatab and delta slowing. Generalized periodic discharges with triphasic morphology were noted at 1.5 to 2.5 Hz.  Photic driving was not seen during photic stimulation. Hyperventilation was not performed.   ABNORMALITY -Periodic discharges with triphasic  morphology, generalized - Continuous slow, generalized IMPRESSION: This study showed generalized periodic discharges with triphasic morphology.  These discharges can be on the ictal-interictal continuum.  However the frequency and morphology is most likely suggestive of toxic-metabolic causes like cefepime toxicity, renal dysfunction, hyperammonemia.  If concern for ictal-interictal activity persist, long-term EEG can be considered. Additionally there is moderate diffuse encephalopathy.  No seizures were noted. [name]   DG Abd 1 View Result Date: 09/27/2023 CLINICAL DATA:  Enteric catheter placement EXAM: ABDOMEN - 1 VIEW COMPARISON:  09/26/2023 FINDINGS: Frontal view of the left hemiabdomen was obtained, excluding the right hemiabdomen and pelvis by collimation. Enteric catheter identified passing below diaphragm, tip projecting over the gastric fundus. Multiple distended gas-filled loops of small bowel are seen within the central abdomen, measuring up to 3 cm in diameter. Paucity of colonic gas. IMPRESSION: 1. Enteric catheter tip projecting over the gastric fundus. 2. Diffuse  gaseous distention of the small bowel with relative paucity of colonic gas, which may reflect small-bowel obstruction. Electronically Signed   By: Sharlet Salina M.D.   On: 09/27/2023 14:43   ECHOCARDIOGRAM COMPLETE Result Date: 09/27/2023    ECHOCARDIOGRAM REPORT   Patient Name:   [name] Date of Exam: 09/27/2023 Medical Rec #:  [id number]         Height:       65.0 in Accession #:    [id number]        Weight:       294.8 lb Date of Birth:  1955/08/10          BSA:          2.333 m Patient Age:    68 years          BP:           166/86 mmHg Patient Gender: F                 HR:           108 bpm. Exam Location:  [facility] Procedure: 2D Echo, Cardiac Doppler, Color Doppler and Intracardiac            Opacification Agent (Both Spectral and Color Flow Doppler were            utilized during procedure). Indications:     CHF   History:         Patient has no prior history of Echocardiogram examinations.                  CHF, Signs/Symptoms:Edema; Risk Factors:Hypertension and                  Diabetes.  Sonographer:     Mikki Harbor Referring Phys:  [id number] Andris Baumann Diagnosing Phys: Debbe Odea MD  Sonographer Comments: Technically difficult study due to poor echo windows and patient is obese. IMPRESSIONS  1. Left ventricular ejection fraction, by estimation, is 60 to 65%. The left ventricle has normal function. The left ventricle has no regional wall motion abnormalities. There is mild left ventricular hypertrophy. Left ventricular diastolic parameters are consistent with Grade I diastolic dysfunction (impaired relaxation).  2. Right ventricular systolic function is normal. The right ventricular size is normal.  3. The mitral valve is degenerative. Mild mitral valve regurgitation.  4. The aortic valve was not well visualized. Aortic valve regurgitation is not visualized. Aortic valve sclerosis/calcification is present, without any evidence of aortic stenosis. Aortic valve mean gradient measures 6.7 mmHg. FINDINGS  Left Ventricle: Left ventricular ejection fraction, by estimation, is 60 to 65%. The left ventricle has normal function. The left ventricle has no regional wall motion abnormalities. Definity contrast agent was given IV to delineate the left ventricular  endocardial borders. Strain was performed and the global longitudinal strain is indeterminate. The left ventricular internal cavity size was normal in size. There is mild left ventricular hypertrophy. Left ventricular diastolic parameters are consistent  with Grade I diastolic dysfunction (impaired relaxation). Right Ventricle: The right ventricular size is normal. No increase in right ventricular wall thickness. Right ventricular systolic function is normal. Left Atrium: Left atrial size was normal in size. Right Atrium: Right atrial size was normal in size.  Pericardium: There is no evidence of pericardial effusion. Mitral Valve: The mitral valve is degenerative in appearance. Mild mitral valve regurgitation. MV peak gradient, 13.2 mmHg. The mean mitral valve gradient is 8.0 mmHg. Tricuspid Valve: The tricuspid  valve is normal in structure. Tricuspid valve regurgitation is not demonstrated. Aortic Valve: The aortic valve was not well visualized. Aortic valve regurgitation is not visualized. Aortic valve sclerosis/calcification is present, without any evidence of aortic stenosis. Aortic valve mean gradient measures 6.7 mmHg. Aortic valve peak gradient measures 12.3 mmHg. Aortic valve area, by VTI measures 2.76 cm. Pulmonic Valve: The pulmonic valve was normal in structure. Pulmonic valve regurgitation is not visualized. Aorta: The aortic root is normal in size and structure. IAS/Shunts: No atrial level shunt detected by color flow Doppler. Additional Comments: 3D was performed not requiring image post processing on an independent workstation and was indeterminate.  LEFT VENTRICLE PLAX 2D LVIDd:         4.40 cm   Diastology LVIDs:         1.90 cm   LV e' medial:    7.40 cm/s LV PW:         1.30 cm   LV E/e' medial:  16.8 LV IVS:        1.40 cm   LV e' lateral:   9.25 cm/s LVOT diam:     2.00 cm   LV E/e' lateral: 13.4 LV SV:         97 LV SV Index:   42 LVOT Area:     3.14 cm  RIGHT VENTRICLE RV Basal diam:  3.25 cm RV Mid diam:    2.60 cm RV S prime:     15.00 cm/s LEFT ATRIUM             Index        RIGHT ATRIUM           Index LA diam:        3.50 cm 1.50 cm/m   RA Area:     12.90 cm LA Vol (A2C):   56.1 ml 24.05 ml/m  RA Volume:   32.60 ml  13.98 ml/m LA Vol (A4C):   46.7 ml 20.02 ml/m LA Biplane Vol: 53.5 ml 22.94 ml/m  AORTIC VALVE                     PULMONIC VALVE AV Area (Vmax):    2.61 cm      PV Vmax:       1.43 m/s AV Area (Vmean):   2.66 cm      PV Peak grad:  8.2 mmHg AV Area (VTI):     2.76 cm AV Vmax:           175.67 cm/s AV Vmean:           120.667 cm/s AV VTI:            0.352 m AV Peak Grad:      12.3 mmHg AV Mean Grad:      6.7 mmHg LVOT Vmax:         146.00 cm/s LVOT Vmean:        102.000 cm/s LVOT VTI:          0.310 m LVOT/AV VTI ratio: 0.88  AORTA Ao Root diam: 3.30 cm Ao Asc diam:  3.40 cm MITRAL VALVE MV Area (PHT): 3.37 cm     SHUNTS MV Area VTI:   2.37 cm     Systemic VTI:  0.31 m MV Peak grad:  13.2 mmHg    Systemic Diam: 2.00 cm MV Mean grad:  8.0 mmHg MV Vmax:       1.82 m/s MV Vmean:  136.0 cm/s MV Decel Time: 225 msec MV E velocity: 124.00 cm/s MV A velocity: 148.00 cm/s MV E/A ratio:  0.84 Debbe Odea MD Electronically signed by Debbe Odea MD Signature Date/Time: 09/27/2023/12:52:04 PM    Final    MR BRAIN WO CONTRAST Result Date: 09/27/2023 CLINICAL DATA:  68 year old female with altered mental status. EXAM: MRI HEAD WITHOUT CONTRAST TECHNIQUE: Multiplanar, multiecho pulse sequences of the brain and surrounding structures were obtained without intravenous contrast. COMPARISON:  Head CT 09/24/2023 and earlier. FINDINGS: Brain: No restricted diffusion to suggest acute infarction. No midline shift, mass effect, evidence of mass lesion, ventriculomegaly, extra-axial collection or acute intracranial hemorrhage. Cervicomedullary junction and pituitary are within normal limits. Cerebral volume is within normal limits for age. Mild for age scattered white matter T2 and FLAIR hyperintensity primarily in the corona radiata. Similar heterogeneity in the pons, more so on the right. No cortical encephalomalacia. No chronic cerebral blood products identified on motion degraded SWI. Deep gray nuclei and cerebellum appear normal. Vascular: Major intracranial vascular flow voids are preserved. Skull and upper cervical spine: Negative. Visualized bone marrow signal is within normal limits. Sinuses/Orbits: Negative. Other: Visible internal auditory structures appear normal. Negative visible scalp and face. IMPRESSION: 1. No acute  intracranial abnormality. 2. Mild for age signal changes compatible with chronic small vessel disease. Electronically Signed   By: Odessa Fleming M.D.   On: 09/27/2023 10:14   DG Abd 1 View Result Date: 09/26/2023 CLINICAL DATA:  NG placement. EXAM: ABDOMEN - 1 VIEW COMPARISON:  None Available. FINDINGS: Feeding tube with weighted tip in the left upper abdomen likely in the region of the gastric fundus. IMPRESSION: Feeding tube with tip in the gastric fundus. Electronically Signed   By: Elgie Collard M.D.   On: 09/26/2023 19:42     Medications:    feeding supplement (OSMOLITE 1.5 CAL) 20 mL/hr at 09/28/23 0800    Chlorhexidine Gluconate Cloth  6 each Topical Daily   feeding supplement (PROSource TF20)  60 mL Per Tube BID   free water  30 mL Per Tube Q4H   heparin injection (subcutaneous)  5,000 Units Subcutaneous Q8H   insulin aspart  0-15 Units Subcutaneous TID WC   insulin glargine-yfgn  15 Units Subcutaneous Daily   levothyroxine  100 mcg Oral Q0600   metoprolol tartrate  10 mg Intravenous Q6H   multivitamin with minerals  1 tablet Per Tube Daily   mouth rinse  15 mL Mouth Rinse 4 times per day   sodium chloride flush  3 mL Intravenous Q12H   thiamine  100 mg Per Tube Daily   acetaminophen **OR** acetaminophen, iohexol, naLOXone (NARCAN)  injection, ondansetron (ZOFRAN) IV, mouth rinse  Assessment/ Plan:  Ms. ASHANI PUMPHREY is a 68 y.o.  female with past medical history of chronic bilateral lower extremity wounds, left fibula osteomyelitis, HFrEF at last EF 45%,  chronic lymhedema, hypertension diabetes, and chronic kidney disease stage !!!b, who was admitted to [facility] on 09/24/2023 for Sepsis [facility]) [A41.9]   Acute Kidney Injury on chronic kidney disease stage IIIb with baseline creatinine 1.4 and GFR of 40 on 09/12/23.  Acute kidney injury secondary to sepsis from infectious process.  Creatinine was 2.75 on admission and has peaked today 3.81. Foley catheter in place. BUN elevated,  unlikely to cause this level of confusion and somnolence.   Adequate urine out noted in foley during visit yesterday. Creatinine continues to rise. Lokelma stopped. No acute indication for dialysis but monitoring closely.  Lab Results  Component Value Date   CREATININE 4.02 (H) 09/28/2023   CREATININE 3.81 (H) 09/27/2023   CREATININE 3.33 (H) 09/26/2023    Intake/Output Summary (Last 24 hours) at 09/28/2023 1115 Last data filed at 09/28/2023 0800 Gross per 24 hour  Intake 6.33 ml  Output 500 ml  Net -493.67 ml   2. Sepsis likely secondary to left fibular osteomyelitis. Reviewed by orthopedic surgery, no surgical intervention. Managing with wound care. Antibiotics completed.   3. Anemia of chronic kidney disease Lab Results  Component Value Date   HGB 9.5 (L) 09/28/2023    Hgb within optimal range. Will monitor for now  4. Diabetes mellitus type II with chronic kidney disease/renal manifestations: insulin dependent. Home regimen includes Lantus, novolog. Most recent hemoglobin A1c is 8.1 on 09/06/23.   Glucose stable.    5. Hypertension with chronic kidney disease. Home regimen includes Lantus today.  Amlodipine, clonidine, furosemide, irbesartan, and metoprolol. Currently prescribed Metoprolol only.  Blood pressure 158/80.    LOS: 4 [name] 4/3/202511:15 AM

## 2023-09-28 NOTE — Progress Notes (Addendum)
 [facility] Progress Note  Patient: [name]    NWG:[id number]  DOA: 09/24/2023     Date of Service: the patient was seen and examined on 09/28/2023  Chief Complaint  Patient presents with   Altered Mental Status   Brief hospital course:  [name] is a 68 y.o. female with medical history significant of chronic bilateral lower extremity wounds with recent diagnosis of left fibula osteomyelitis, HFrEF with last EF of 45%, chronic lymphedema, hypertension, type 2 diabetes, PAD, CKD stage IIIa, who presents to the ED due to altered mental status.   History obtained through chart review due to patient's altered mental status. Per facility staff at [facility], patient has become increasingly altered over the past 1 to 2 days and has been refusing to take her medications.  At baseline, she is alert and oriented x 4 with no underlying history of dementia.  No reports of fever.   Per chart review, patient was recently admitted on 09/04/2023 and was discovered to have a left fibula osteomyelitis.  Cultures grew Staph aureus and Providencia.  She was discharged on cefepime and daptomycin with plans to continue IV antibiotics through October 19, 2023.   ED course: On arrival to the ED, patient was normotensive at 117/104 with heart rate of 90.  She was saturating at 100% on room air.  She was afebrile at 98.3.  Initial workup notable for hemoglobin of 10.9, platelets 415, bicarb 21, BUN 38, creatinine 2.75, and GFR of 18.  Lactic acid 2.7.  Urinalysis with ketonuria, proteinuria, few bacteria.  CT head with no acute intracranial abnormalities.  Chest x-ray with low lung volumes.  Patient started on Rocephin, vancomycin, IV fluids and pain medication.  [facility] contacted for admission.   Assessment and Plan:  # Toxic metabolic encephalopathy possible due to cefepime and opiates Patient presented with altered mental status ABG reviewed, no CO2 retention. Avoid sedating medications and  avoid opiates PCCM consulted, patient received 1 dose of Narcan with some improvement in the mental status Rule out DKA, encephalopathy could be due to cefepime toxicity ID consulted, recommended to stop daptomycin and cefepime, patient already treated for 12 weeks.  Leg wounds are chronic.  4/1 Narcan 1 dose given, patient started moaning but still no significant improvement in her mental status.   Insert NG tube, consulted nutritionist to start enteral feeding Continue IV fluid for hydration CT head was negative on admission,  4/2 MRI brain negative for any acute findings 4/2 EEG: Moderate diffuse encephalopathy, no seizures were noted.  Toxic metabolic encephalopathy likely cefepime toxicity, renal dysfunction and hyperammonia. Ammonia level within normal range.    Paroxysmal A-fib with RVR developed on 4/1 in the morning S/p Cardizem 10 mg IV one-time dose given S/p Lopressor 5 mg IV x 2 doses given 4/2 Increased Lopressor IV 10 mg q6h Monitor BP and heart rate, titrate medications accordingly. 4/2 TTE shows LVEF 60-65%, no WMA mild LV hypertrophy, grade 1 diastolic dysfunction.  No significant valvular abnormality, negative PFO   # Sepsis secondary to left fibular osteomyelitis MRI LLE reviewed Patient was discharged on cefepime and daptomycin via PICC line to be continued through 10/19/2023 as per ID recommendation Seen by orthopedic surgery, recommended no surgical intervention Continue wound care Continue current antibiotics Stoppod antibiotics as per ID  # AKI on CKD stage IIIb Baseline creatinine range 1.1-1.6 Creatinine 2.75 on admission Continue IV fluid for hydration Monitor urine output, avoid nephrotoxic medications 3/31 Foley catheter insertion  to monitor intake and output 4/3 creatinine 4.02 elevated Continue IV fluid for hydration Follow nephrology for further recommendation    # Hyperkalemia due to renal failure.  Hyperkalemia resolved Lokelma order placed  per patient is unable to take oral due to AMS S/p D10, insulin 5 units and albuterol nebulizer Repeat BMP Continue to monitor on telemetry   # Metabolic acidosis due to renal failure S/p Bicarbonate 150 mEq IV given on 3/31 S/p bicarbonate 50 mEq x 1 dose given on 4/1 4/2 bicarb 50 mEq one-time IV push given Monitor electrolytes   # IDDM T2  Continue NovoLog sliding scale Monitor CBG Start diabetic diet when patient is able to swallow Held gabapentin due to altered mental status   # Chronic systolic CHF and hypertension, PAD Held home medications amlodipine 5 mg, Lasix 20 mg BID, Toprol-XL 12.5 mg, irbesartan 150 mg, clonidine 0.3 mg p.o. 3 times daily, Plavix 75 mg, Held home meds due to altered mental status and sepsis Monitor BP and resume medications as per improvement  # Hypothyroid, TSH 0.1 very low and free T40.67 At lower and Patient was on Synthroid 200 mcg p.o. daily, decreased dose to 100 mcg p.o. daily when she will be able to swallow.  # Hyperphosphatemia due to renal failure, monitor electrolytes daily  # Chronic pain, patient is slightly more awake on 4/3 and she is crying and moaning seems in discomfort. Started Dilaudid 0.5 mg IV every 4 hourly as needed. Tylenol 650 mg 3 times daily via NG tube   Overall prognosis is poor, patient remains at high risk to deteriorate and patient is full code. PCCM was consulted on 3/31 Palliative care consulted for goals of care discussion, patient has no immediate family, she has a friend so palliative will try to contact her and make final decisions.    Body mass index is 48.39 kg/m.  Interventions:  Pressure Injury 04/17/21 Sacrum Medial Stage 2 -  Partial thickness loss of dermis presenting as a shallow open injury with a red, pink wound bed without slough. (Active)  04/17/21 0444  Location: Sacrum  Location Orientation: Medial  Staging: Stage 2 -  Partial thickness loss of dermis presenting as a shallow open  injury with a red, pink wound bed without slough.  Wound Description (Comments):   Present on Admission: Yes     Pressure Injury 04/17/21 Heel Left;Posterior Unstageable - Full thickness tissue loss in which the base of the injury is covered by slough (yellow, tan, gray, green or brown) and/or eschar (tan, brown or black) in the wound bed. (Active)  04/17/21   Location: Heel  Location Orientation: Left;Posterior  Staging: Unstageable - Full thickness tissue loss in which the base of the injury is covered by slough (yellow, tan, gray, green or brown) and/or eschar (tan, brown or black) in the wound bed.  Wound Description (Comments):   Present on Admission: Yes     Diet: Carb modified diet when able to swallow DVT Prophylaxis: Subcutaneous Heparin    Advance goals of care discussion: DNR/Limited code  CODE STATUS discussed with patient's close friend on fortunately, she agreed to keep her DNR DNI.  Patient does not have any immediate family member.  Family Communication: family was not present at bedside, at the time of interview.  The pt provided permission to discuss medical plan with the family. Opportunity was given to ask question and all questions were answered satisfactorily.  4/3 management plan discussed with patient's friend at bedside.  Disposition:  Pt is from SNF, admitted with AMS due to cefepime toxicity, still has AMS, which precludes a safe discharge. Discharge to SNF, when stable, may need few days to improve. Prognosis is poor, patient may need hospice care.  Subjective: No significant events overnight, patient still has altered mental status, she is sleeping more awake today, started crying and moaning unable to offer any complaints.  Physical Exam: General: More awake today, started moaning and crying Eyes: open and spontaneously moving ENT: Oral Mucosa Clear, but dry Neck: no JVD,  Cardiovascular: S1 and S2 Present, no Murmur,  Respiratory: Equal air entry  bilaterally, decreased breath sounds due to altered mental status.  No significant crackles or wheezes Abdomen: Bowel Sound present, Soft and no tenderness,  Skin: Bilateral chronic leg wounds, wrapped with dressing Extremities: 4+ Pedal edema, no calf tenderness Neurologic: Obtunded, very lethargic, not responsive, unable to follow commands Gait not checked due to patient safety concerns  Vitals:   09/28/23 0500 09/28/23 0800 09/28/23 1000 09/28/23 1322  BP:  (!) 158/80 (!) 169/74 (!) 148/117  Pulse:  (!) 103 (!) 103   Resp:  19 14 15   Temp:      TempSrc:  Oral    SpO2:  100% 99%   Weight: 131.9 kg     Height:        Intake/Output Summary (Last 24 hours) at 09/28/2023 1351 Last data filed at 09/28/2023 0800 Gross per 24 hour  Intake 6.33 ml  Output 500 ml  Net -493.67 ml   Filed Weights   09/26/23 0500 09/27/23 0529 09/28/23 0500  Weight: 129.5 kg 133.7 kg 131.9 kg    Data Reviewed: I have personally reviewed and interpreted daily labs, tele strips, imagings as discussed above. I reviewed all nursing notes, pharmacy notes, vitals, pertinent old records I have discussed plan of care as described above with RN and patient/family.  CBC: Recent Labs  Lab 09/24/23 1214 09/25/23 0602 09/25/23 2328 09/26/23 0125 09/27/23 0151 09/28/23 0435  WBC 9.8 17.6* 12.6* 11.9* 10.0 9.8  NEUTROABS 6.3 16.1* 10.3*  --   --   --   HGB 10.9* 10.1* 9.3* 9.8* 10.3* 9.5*  HCT 36.1 33.4* 30.5* 31.6* 34.2* 31.0*  MCV 90.3 92.0 90.2 89.3 88.8 89.3  PLT 415* 398 361 364 360 317   Basic Metabolic Panel: Recent Labs  Lab 09/25/23 1351 09/25/23 2328 09/26/23 0125 09/27/23 0151 09/28/23 0435  NA 140 140 141 140 145  K 4.9 4.6 4.4 4.6 4.4  CL 107 109 110 106 111  CO2 17* 19* 19* 19* 22  GLUCOSE 264* 181* 170* 171* 85  BUN 45* 45* 45* 50* 50*  CREATININE 3.23* 3.29* 3.33* 3.81* 4.02*  CALCIUM 8.7* 8.5* 8.4* 8.5* 8.2*  MG 2.0  --  2.0 2.2 2.1  PHOS 5.9*  --  5.6* 5.7* 5.7*     Studies: CT ABDOMEN PELVIS WO CONTRAST Result Date: 09/27/2023 CLINICAL DATA:  Sepsis and abdominal pain. Suspect small bowel obstruction. EXAM: CT ABDOMEN AND PELVIS WITHOUT CONTRAST TECHNIQUE: Multidetector CT imaging of the abdomen and pelvis was performed following the standard protocol without IV contrast following administration of oral contrast only. RADIATION DOSE REDUCTION: This exam was performed according to the departmental dose-optimization program which includes automated exposure control, adjustment of the mA and/or kV according to patient size and/or use of iterative reconstruction technique. COMPARISON:  CT without contrast 04/02/2022, CT with IV contrast 10/30/2021. FINDINGS: Lower chest: The cardiac size is normal. The mitral ring is  heavily calcified. There are three-vessel coronary calcifications. There is no pericardial effusion. There is patchy airspace consolidation throughout the lung fields most likely due to multifocal pneumonia. Trace bilateral pleural effusions. A Dobbhoff feeding tube extends to the left and posteriorly within the stomach with the tip in the proximal lumen. Hepatobiliary: There is loss of fine detail due to respiratory motion. The liver is steatotic. No mass is seen through motion artifact. There are several rim calcified gallstones largest is 1.4 cm. No wall thickening or biliary dilatation. Pancreas: Partially atrophic. Otherwise unremarkable without contrast. Spleen: No abnormality is seen through the breathing motion. Adrenals/Urinary Tract: There is no adrenal mass. No new contour deforming renal abnormality is seen through the motion artifacts. There are Bosniak 1 cysts in the right kidney, in the upper pole measuring 3.2 cm and 18 Hounsfield units, in the lower pole measuring 1.4 cm and 1.8 Hounsfield units. No follow-up imaging is recommended. The unenhanced kidneys are otherwise unremarkable. There is no urinary stone or obstruction. The bladder is  catheterized, contracted and obscured by metal spray artifact from a right hip replacement. Stomach/Bowel: No dilatation or wall thickening. No bowel obstruction or inflammation is seen. Normal subcecal appendix. Scattered sigmoid diverticulosis without evidence of diverticulitis. Vascular/Lymphatic: Aortic atherosclerosis. No enlarged abdominal or pelvic lymph nodes. Reproductive: Uterus and bilateral adnexa are unremarkable. Other: There is evidence of recent injections into the subcutaneous abdominal wall fat. Bilateral stranding noted on the right greater than left in the redundant fatty soft tissues of the left and right abdominal wall, could be due to cellulitis or third spacing of fluid reserves and does not appear to have been present previously. Traumatic etiology could explain this in the appropriate clinical setting. There is trace presacral ascites. No other free fluid. There is no free hemorrhage, free air, or localizing inflammatory process. Musculoskeletal: Right hip arthroplasty. Osteopenia with advanced degenerative change of the spine. Left hip DJD. Multilevel acquired lumbar spinal stenosis. Multilevel foraminal stenosis. No acute or other significant osseous findings. IMPRESSION: 1. Patchy airspace consolidation throughout the lung fields most likely due to multifocal pneumonia. 2. No bowel obstruction or inflammation. 3. Trace bilateral pleural effusions.  Trace presacral ascites. 4. Dobbhoff feeding tube tip in the proximal stomach. 5. Hepatic steatosis.  Cholelithiasis. 6. Diverticulosis without evidence of diverticulitis. 7. Bilateral stranding in the redundant superficial fatty soft tissues of the left and right abdominal wall, could be due to cellulitis or third spacing of fluid reserves. Traumatic etiology could explain this in the appropriate clinical setting. 8. Aortic and coronary artery atherosclerosis. 9. Osteopenia and advanced degenerative change of the spine. Electronically Signed    By: Almira Bar M.D.   On: 09/27/2023 21:20   EEG adult Result Date: 09/27/2023 Charlsie Quest, MD     09/27/2023  4:31 PM Patient Name: [name] MRN: [id number] Epilepsy Attending: Charlsie Quest Referring Physician/Provider: Gillis Santa, MD Date: 09/27/2023 Duration: 23.05 mins Patient history: 68yo F with ams. EEG to evaluate for seizure Level of alertness: awake/ lethargic AEDs during EEG study: None Technical aspects: This EEG study was done with scalp electrodes positioned according to the 10-20 International system of electrode placement. Electrical activity was reviewed with band pass filter of 1-70Hz , sensitivity of 7 uV/mm, display speed of 48mm/sec with a 60Hz  notched filter applied as appropriate. EEG data were recorded continuously and digitally stored.  Video monitoring was available and reviewed as appropriate. Description: EEG showed continuous generalized 3-5 Asatab and delta slowing. Generalized periodic  discharges with triphasic morphology were noted at 1.5 to 2.5 Hz.  Photic driving was not seen during photic stimulation. Hyperventilation was not performed.   ABNORMALITY -Periodic discharges with triphasic morphology, generalized - Continuous slow, generalized IMPRESSION: This study showed generalized periodic discharges with triphasic morphology.  These discharges can be on the ictal-interictal continuum.  However the frequency and morphology is most likely suggestive of toxic-metabolic causes like cefepime toxicity, renal dysfunction, hyperammonemia.  If concern for ictal-interictal activity persist, long-term EEG can be considered. Additionally there is moderate diffuse encephalopathy.  No seizures were noted. Priyanka Annabelle Harman    Scheduled Meds:  Chlorhexidine Gluconate Cloth  6 each Topical Daily   feeding supplement (PROSource TF20)  60 mL Per Tube BID   free water  30 mL Per Tube Q4H   heparin injection (subcutaneous)  5,000 Units Subcutaneous Q8H   insulin aspart   0-15 Units Subcutaneous TID WC   insulin glargine-yfgn  15 Units Subcutaneous Daily   levothyroxine  100 mcg Oral Q0600   metoprolol tartrate  10 mg Intravenous Q6H   multivitamin with minerals  1 tablet Per Tube Daily   mouth rinse  15 mL Mouth Rinse 4 times per day   sodium chloride flush  3 mL Intravenous Q12H   thiamine  100 mg Per Tube Daily   Continuous Infusions:  feeding supplement (OSMOLITE 1.5 CAL) 20 mL/hr at 09/28/23 0800    PRN Meds: acetaminophen **OR** acetaminophen, HYDROmorphone (DILAUDID) injection, iohexol, naLOXone (NARCAN)  injection, ondansetron (ZOFRAN) IV, mouth rinse  Time spent: 55 minutes  Author: Gillis Santa. MD [facility] 09/28/2023 1:51 PM  To reach On-call, see care teams to locate the attending and reach out to them via [email or web address]. If 7PM-7AM, please contact night-coverage If you still have difficulty reaching the attending provider, please page the [facility] (Director on Call) for [facility] on amion for assistance.

## 2023-09-28 NOTE — Plan of Care (Signed)

## 2023-09-28 NOTE — Consult Note (Signed)
 [facility] Liaison Consult Note   09/28/2023  [name] Nov 19, 1955 [id number]  *Coverage review for Elliot Cousin, RN [facility] RN [facility] Liaison  Insurance:  [facility]  Primary Care Provider: Pcp, No  [Patient on green banner and BI readmission list] No provider noted   [facility] Liaison screened the patient remotely at [facility].    The patient was screened for 30 day readmission hospitalization with noted high risk score for unplanned readmission risk 2 hospital admissions in 6 months.  [name] is a 68 y.o. patient that is  assessed for potential [facility] Coordination service needs for post hospital transition for care coordination. Review of patient's electronic medical record reveals patient is a resident from [facility]. Chart reviewed for 30 day readmission with sepsis. Inpatient TOC RN notes and Palliative notes reviewed. Patient is not able to communicate per notes.  Plan: No VBCI community care coordination needs assessed.    VBCI Community Care, Population Health does not replace or interfere with any arrangements made by the Inpatient Transition of Care team.   For questions contact:   Charlesetta Shanks, RN, BSN, CCM [employer]  [facility], [facility] Liaison Direct Dial: [phone] or secure chat Email: [email or web address]

## 2023-09-28 NOTE — Progress Notes (Signed)
 Palliative Care Progress Note, Assessment & Plan   Patient Name: [name]       Date: 09/28/2023 DOB: 1956-03-28  Age: 68 y.o. MRN#: [id number] Attending Physician: Gillis Santa, MD Primary Care Physician: Pcp, No Admit Date: 09/24/2023  Subjective: Patient is sitting up in bed, asleep.  She awakens to my presence.  She withdraws from physical stimuli.  She moans/groans but makes no attempt to speak.  No family or friends present during my visit.  HPI: 68 y.o. female  with past medical history of PAD, type 2 diabetes, HTN, CKD, right fifth toe amputation, s/p THA with hypothyroidism, Bell's palsy (2015), and recent hospitalization at [facility] for osteomyelitis (3/10 - 3/21, discharged with PICC in place for IV antibiotic treatment) admitted on 09/24/2023 with altered mental status with no history of dementia.   Patient is being treated for AKI.  Creatinine has elevated on/1-3.33.   I di was consulted.  Suspicion of cefepime toxicity.  Cefepime stopped.  All other sedatives held as well.   Orthopedic surgery was consulted and recommended no surgical intervention.  Continue with wound care.   PMT was consulted to support patient and goals of care discussions.  Summary of counseling/coordination of care: Extensive chart review completed prior to meeting patient including labs, vital signs, imaging, progress notes, orders, and available advanced directive documents from current and previous encounters.   After reviewing the patient's chart and assessing the patient at bedside, I spoke with patient in regards to symptom management and goals of care.   I attempted to gauge patient's symptoms.  She is unable to participate in full system assessment.  No brow furrowing, fidgeting, or signs of distress noted.   Once I was not in eyesight of patient, she stopped moaning/groaning and calmed down, returning to sleep.  The patient is not able to engage in discussions with me, she is able to make eye contact intractably across the room.  I discussed that patient has an NG tube in her left nare to provide nutrition until she is able to take it in the natural way.  Also discussed that we are continue to closely monitor her vital signs and blood work in an attempt to have her feel better.  Discussed we think she may have some toxicity from antibiotics given to treat her osteomyelitis.  Again, patient is unable to respond appropriately but did stare and appeared to be engaging with her eyes during my explanations.  No adjustment to [facility] needed at this time.  Physical Exam Vitals reviewed.  Constitutional:      General: She is not in acute distress.    Appearance: She is obese. She is ill-appearing.  HENT:     Head: Normocephalic.     Nose: Nose normal.     Mouth/Throat:     Mouth: Mucous membranes are moist.  Eyes:     Pupils: Pupils are equal, round, and reactive to light.  Cardiovascular:     Rate and Rhythm: Tachycardia present.  Pulmonary:     Effort: Pulmonary effort is normal.  Abdominal:     Palpations: Abdomen is soft.  Skin:    General: Skin is warm and dry.  Coloration: Skin is pale.     Comments: Generalized weakness  Neurological:     Mental Status: She is alert.     Comments: Moans/groans, nonverbal  Psychiatric:        Behavior: Behavior normal.             Total Time 25 minutes   Time spent includes: Detailed review of medical records (labs, imaging, vital signs), medically appropriate exam (mental status, respiratory, cardiac, skin), discussed with treatment team, counseling and educating patient, family and staff, documenting clinical information, medication management and coordination of care.  Samara Deist L. Bonita Quin, DNP, FNP-BC Palliative Medicine Team

## 2023-09-28 NOTE — Progress Notes (Signed)
 Brief Progress Note Consult receive yesterday for possible SBO while pending CT Abdomen/Pelvis.   CT Abdomen/Pelvis yesterday (04/02) reviewed and without evidence of obstruction:  IMPRESSION: 1. Patchy airspace consolidation throughout the lung fields most likely due to multifocal pneumonia. 2. No bowel obstruction or inflammation. 3. Trace bilateral pleural effusions.  Trace presacral ascites. 4. Dobbhoff feeding tube tip in the proximal stomach. 5. Hepatic steatosis.  Cholelithiasis. 6. Diverticulosis without evidence of diverticulitis. 7. Bilateral stranding in the redundant superficial fatty soft tissues of the left and right abdominal wall, could be due to cellulitis or third spacing of fluid reserves. Traumatic etiology could explain this in the appropriate clinical setting. 8. Aortic and coronary artery atherosclerosis. 9. Osteopenia and advanced degenerative change of the spine.  Patient seen briefly this AM She is moaning without any intelligible sounds Does not participate  Abdomen is soft and does not appear tender  She has Dobbhoff in place and tolerating TF at 20 ml/hr   No evidence of SBO, no need for surgery involvement  We will remain available if needed. Please call with questions/concerns.   Discussed with Dr Claudine Mouton and Hospitalist   -- Lynden Oxford, PA-C [facility] 09/28/2023, 9:22 AM M-F: 7am - 4pm

## 2023-09-28 NOTE — Progress Notes (Signed)
 Nutrition Follow-up  DOCUMENTATION CODES:   Morbid obesity  INTERVENTION:   -TF via NGT:    Continue Osmolite 1.5 @ 20 ml/hr and increase by 10 ml every 12 hours to goal rate of 60 ml/hr.    60 ml Prosource TF daily   30 ml free water flush every 4 hours    Tube feeding regimen provides 1960 kcal (100% of needs), 115 grams of protein, and 914 ml of H2O. Total free water: 1094 ml daily   -Monitor Mg, K, and Phos and replete as needed due to high refeeding risk -Continue MVI with minerals daily -Continue 100 mg thiamine daily x 7 days  NUTRITION DIAGNOSIS:   Inadequate oral intake related to inability to eat as evidenced by NPO status.  Ongoing  GOAL:   Patient will meet greater than or equal to 90% of their needs  Progressing   MONITOR:   Diet advancement, TF tolerance  REASON FOR ASSESSMENT:   Consult Assessment of nutrition requirement/status, Enteral/tube feeding initiation and management  ASSESSMENT:   Pt with medical history significant of chronic bilateral lower extremity wounds with recent diagnosis of left fibula osteomyelitis, HFrEF with last EF of 45%, chronic lymphedema, hypertension, type 2 diabetes, PAD, CKD stage IIIa, who presents due to altered mental  4/1- NGT placed, KUB confirmed tip of tube in gastric fundus  4/2- s/p CT abdomen and pelvis, no SBO noted, surgery signed off  Reviewed I/O's: -500 ml x 24 hours and +5 L since admission  UOP: 500 ml x 24 hours  Pt lying in bed at time of visit. Pt opens her eyes and looks at this RD when name is called. Pt moaning throughout visit.   Pt remains NPO and receiving sole source nutrition via NGT: Osmolite 1.5 @ 20 ml/hr.   Palliative care following for goals of care discussions.   Labs reviewed: Mg and K WDL, Phos: 5.7, CBGS: 78-124 (inpatient orders for glycemic control are 0-15 units insulin aspart TID with meals and 15 units insulin glargine-yfgn daily).    Diet Order:   Diet Order              Diet NPO time specified  Diet effective now                   EDUCATION NEEDS:   Not appropriate for education at this time  Skin:  Skin Assessment: Skin Integrity Issues: Skin Integrity Issues:: Other (Comment) Other: chronic venous stasis ulcers of both legs with chronic vs acute osteomyelitis of the left fibula  Last BM:  09/28/23 (type 6)  Height:   Ht Readings from Last 1 Encounters:  09/24/23 5\' 5"  (1.651 m)    Weight:   Wt Readings from Last 1 Encounters:  09/28/23 131.9 kg    Ideal Body Weight:  56.8 kg  BMI:  Body mass index is 48.39 kg/m.  Estimated Nutritional Needs:   Kcal:  1800-2000  Protein:  100-115 grams  Fluid:  1.8-2.0 L    [name], RD, LDN, CDCES Registered Dietitian III Certified Diabetes Care and Education Specialist If unable to reach this RD, please use "RD Inpatient" group chat on secure chat between hours of 8am-4 pm daily

## 2023-09-29 DIAGNOSIS — N189 Chronic kidney disease, unspecified: Secondary | ICD-10-CM | POA: Diagnosis not present

## 2023-09-29 DIAGNOSIS — R6520 Severe sepsis without septic shock: Secondary | ICD-10-CM | POA: Diagnosis not present

## 2023-09-29 DIAGNOSIS — G253 Myoclonus: Secondary | ICD-10-CM | POA: Diagnosis not present

## 2023-09-29 DIAGNOSIS — R4182 Altered mental status, unspecified: Secondary | ICD-10-CM | POA: Diagnosis not present

## 2023-09-29 DIAGNOSIS — A419 Sepsis, unspecified organism: Secondary | ICD-10-CM | POA: Diagnosis not present

## 2023-09-29 DIAGNOSIS — N179 Acute kidney failure, unspecified: Secondary | ICD-10-CM | POA: Diagnosis not present

## 2023-09-29 DIAGNOSIS — G9340 Encephalopathy, unspecified: Secondary | ICD-10-CM | POA: Diagnosis not present

## 2023-09-29 DIAGNOSIS — I5022 Chronic systolic (congestive) heart failure: Secondary | ICD-10-CM | POA: Diagnosis not present

## 2023-09-29 DIAGNOSIS — G9341 Metabolic encephalopathy: Secondary | ICD-10-CM | POA: Diagnosis not present

## 2023-09-29 LAB — BASIC METABOLIC PANEL WITH GFR
Anion gap: 10 (ref 5–15)
BUN: 51 mg/dL — ABNORMAL HIGH (ref 8–23)
CO2: 23 mmol/L (ref 22–32)
Calcium: 8.4 mg/dL — ABNORMAL LOW (ref 8.9–10.3)
Chloride: 111 mmol/L (ref 98–111)
Creatinine, Ser: 3.89 mg/dL — ABNORMAL HIGH (ref 0.44–1.00)
GFR, Estimated: 12 mL/min — ABNORMAL LOW (ref >60)
Glucose, Bld: 197 mg/dL — ABNORMAL HIGH (ref 70–99)
Potassium: 4 mmol/L (ref 3.5–5.1)
Sodium: 144 mmol/L (ref 135–145)

## 2023-09-29 LAB — CBC
HCT: 31.2 % — ABNORMAL LOW (ref 36.0–46.0)
Hemoglobin: 9.6 g/dL — ABNORMAL LOW (ref 12.0–15.0)
MCH: 27.7 pg (ref 26.0–34.0)
MCHC: 30.8 g/dL (ref 30.0–36.0)
MCV: 90.2 fL (ref 80.0–100.0)
Platelets: 309 10*3/uL (ref 150–400)
RBC: 3.46 MIL/uL — ABNORMAL LOW (ref 3.87–5.11)
RDW: 18.8 % — ABNORMAL HIGH (ref 11.5–15.5)
WBC: 8.9 10*3/uL (ref 4.0–10.5)
nRBC: 0 % (ref 0.0–0.2)

## 2023-09-29 LAB — GLUCOSE, CAPILLARY
Glucose-Capillary: 202 mg/dL — ABNORMAL HIGH (ref 70–99)
Glucose-Capillary: 174 mg/dL — ABNORMAL HIGH (ref 70–99)
Glucose-Capillary: 123 mg/dL — ABNORMAL HIGH (ref 70–99)
Glucose-Capillary: 136 mg/dL — ABNORMAL HIGH (ref 70–99)
Glucose-Capillary: 94 mg/dL (ref 70–99)
Glucose-Capillary: 107 mg/dL — ABNORMAL HIGH (ref 70–99)

## 2023-09-29 LAB — CULTURE, BLOOD (ROUTINE X 2)
Culture: NO GROWTH
Culture: NO GROWTH
Special Requests: ADEQUATE

## 2023-09-29 MED ORDER — METOPROLOL TARTRATE 5 MG/5ML IV SOLN
10.0000 mg | Freq: Once | INTRAVENOUS | Status: AC
  Administered 2023-09-29: 10 mg via INTRAVENOUS
  Filled 2023-09-29: qty 10

## 2023-09-29 MED ORDER — FREE WATER: 30.0000 mL | Status: DC

## 2023-09-29 MED ORDER — SODIUM CHLORIDE 0.9 % IV SOLN: INTRAVENOUS | Status: DC

## 2023-09-29 MED ORDER — OSMOLITE 1.5 CAL PO LIQD: 1000.0000 mL | ORAL | Status: DC

## 2023-09-29 MED ORDER — METOPROLOL TARTRATE 5 MG/5ML IV SOLN
10.0000 mg | Freq: Four times a day (QID) | INTRAVENOUS | Status: AC
  Administered 2023-09-29: 10 mg via INTRAVENOUS
  Filled 2023-09-29: qty 10

## 2023-09-29 MED ORDER — ADULT MULTIVITAMIN W/MINERALS CH
1.0000 | ORAL_TABLET | Freq: Every day | ORAL | Status: DC
  Administered 2023-10-02: 1 via ORAL
  Filled 2023-09-29 (×3): qty 1

## 2023-09-29 MED ORDER — ACETAMINOPHEN 160 MG/5ML PO SOLN
650.0000 mg | Freq: Three times a day (TID) | ORAL | Status: DC
  Administered 2023-09-29 – 2023-10-02 (×7): 650 mg via ORAL
  Filled 2023-09-29 (×10): qty 20.3

## 2023-09-29 MED ORDER — THIAMINE MONONITRATE 100 MG PO TABS
100.0000 mg | ORAL_TABLET | Freq: Every day | ORAL | Status: DC
  Administered 2023-10-02: 100 mg via ORAL
  Filled 2023-09-29 (×3): qty 1

## 2023-09-29 MED ORDER — METOPROLOL TARTRATE 50 MG PO TABS
50.0000 mg | ORAL_TABLET | Freq: Two times a day (BID) | ORAL | Status: DC
  Administered 2023-09-29: 50 mg via ORAL
  Filled 2023-09-29 (×2): qty 1

## 2023-09-29 NOTE — Progress Notes (Signed)
 Patient has pulled out Dobbhoff from right nares. Tube feeds on hold.

## 2023-09-29 NOTE — Progress Notes (Signed)
 Palliative Care Progress Note, Assessment & Plan   Patient Name: [name]       Date: 09/29/2023 DOB: 1956-04-30  Age: 68 y.o. MRN#: [id number] Attending Physician: Autumn Santa, MD Primary Care Physician: Pcp, No Admit Date: 09/24/2023  Subjective: Patient is lying in bed, awake and alert.  She acknowledges my presence.  She says "hello".  She interacts appropriately in conversations with short/yes/no answers.  This is the most interactive I have seen her.  No family or friends present during my visit.  HPI: 68 y.o. female  with past medical history of PAD, type 2 diabetes, HTN, CKD, right fifth toe amputation, s/p THA with hypothyroidism, Bell's palsy (2015), and recent hospitalization at [facility] for osteomyelitis (3/10 - 3/21, discharged with PICC in place for IV antibiotic treatment) admitted on 09/24/2023 with altered mental status with no history of dementia.   Patient is being treated for AKI.  Creatinine has elevated on/1-3.33.   I di was consulted.  Suspicion of cefepime toxicity.  Cefepime stopped.  All other sedatives held as well.   Orthopedic surgery was consulted and recommended no surgical intervention.  Continue with wound care.   PMT was consulted to support patient and goals of care discussions.  Summary of counseling/coordination of care: Extensive chart review completed prior to meeting patient including labs, vital signs, imaging, progress notes, orders, and available advanced directive documents from current and previous encounters.   After reviewing the patient's chart and assessing the patient at bedside, I spoke with patient in regards to symptom management and goals of care.   Patient was able to confirm that she is not experience any pain or discomfort at this  time.  Patient states she is "surprised".  Discussed that patient is becoming a more alert and awake than she has been during this hospitalization.  Patient's course of hospitalization described in detail.  Reviewed that patient removed feeding tube last night.  She said "I did not know that".  Space and opportunity provided for patient to ask questions and voiced her concerns regarding her current medical situation.  She continued to repeat, "I am surprised" and "I did not know that".  Advance directives, next of kin/surrogate decision maker, and CODE STATUS discussed with patient.  Patient shares that she is going to "think a lot about it".  Discussed importance of advance care planning so that patient's wishes and boundaries of medical care are known prior to any urgent or emergent event.  She shares understanding.  I highlighted that patient's friend [name] was speaking on her behalf when patient had loss capacity.  She again said "I did not know that".  When asked if she was okay with [name] remaining her next of kin until patient has full capacity, patient said "I am not sure".  Though patient's mentation is improving she does not have full capacity to participate in goals of care medical decision making at this time.  She was appreciative of my visit.  PMT will continue to follow and support.  After meeting with patient, spoke with patient's friend [name] over the phone.  Brief medical update given, including patient's ensure improved mentation and removal of NG tube.  [name] was  grateful for the update and plans to visit with patient later today.  [name] voiced concerns over a form received from [facility] in regards to [employer] aid benefits.  I shared that TOC could more appropriately answer her questions and hear her concerns.  Secure chat sent to [facility] requesting they contact [name] in regards to this paperwork.  No adjustment to plan of care at this time.  I am off service until  Tuesday.  A PMT colleague will follow and support patient until that time.   Physical Exam Vitals reviewed.  Constitutional:      General: She is not in acute distress.    Appearance: She is obese. She is not toxic-appearing.  HENT:     Head: Normocephalic.     Mouth/Throat:     Mouth: Mucous membranes are moist.  Eyes:     Pupils: Pupils are equal, round, and reactive to light.  Pulmonary:     Effort: Pulmonary effort is normal.  Abdominal:     Palpations: Abdomen is soft.  Musculoskeletal:     Comments: Generalized weakness  Skin:    General: Skin is warm and dry.  Neurological:     Mental Status: She is alert.     Comments: Oriented to self  Psychiatric:        Mood and Affect: Mood normal.        Behavior: Behavior normal.             Total Time 50 minutes   Time spent includes: Detailed review of medical records (labs, imaging, vital signs), medically appropriate exam (mental status, respiratory, cardiac, skin), discussed with treatment team, counseling and educating patient, family and staff, documenting clinical information, medication management and coordination of care.  [name] L. Bonita Quin, DNP, FNP-BC Palliative Medicine Team

## 2023-09-29 NOTE — Progress Notes (Signed)
 [facility]  ROUNDING NOTE   Subjective:   Patient seen laying in bed No family present Eyes open Intermittently responding to questions Withdraw from pain  Creatinine 3.89 (4.02) ( 3.81) Osmolite 85ml/hr  Objective:  Vital signs in last 24 hours:  Temp:  [98 F (36.7 C)-99 F (37.2 C)] 98 F (36.7 C) (04/04 1151) Pulse Rate:  [89-99] 99 (04/04 0200) Resp:  [11-16] 16 (04/04 0200) BP: (137-166)/(52-117) 160/82 (04/04 0200) SpO2:  [96 %-99 %] 96 % (04/04 0200) Weight:  [131.3 kg] 131.3 kg (04/04 0703)  Weight change:  Filed Weights   09/27/23 0529 09/28/23 0500 09/29/23 0703  Weight: 133.7 kg 131.9 kg 131.3 kg    Intake/Output: I/O last 3 completed shifts: In: 575.7 [NG/GT:575.7] Out: 900 [Urine:900]   Intake/Output this shift:  Total I/O In: -  Out: 300 [Urine:300]  Physical Exam: General: NAD, ill appearing   Head: Normocephalic, atraumatic. Dry oral mucosa  Eyes: Anicteric  Lungs:  Clear to auscultation  Heart: Regular rhythm, tachycardia   Abdomen:  Soft, nontender,   Extremities:  1+ peripheral edema.  Neurologic: Alert  Skin: No lesions  Access: None  GU Foley  Basic Metabolic Panel: Recent Labs  Lab 09/25/23 1351 09/25/23 2328 09/26/23 0125 09/27/23 0151 09/28/23 0435 09/29/23 0549  NA 140 140 141 140 145 144  K 4.9 4.6 4.4 4.6 4.4 4.0  CL 107 109 110 106 111 111  CO2 17* 19* 19* 19* 22 23  GLUCOSE 264* 181* 170* 171* 85 197*  BUN 45* 45* 45* 50* 50* 51*  CREATININE 3.23* 3.29* 3.33* 3.81* 4.02* 3.89*  CALCIUM 8.7* 8.5* 8.4* 8.5* 8.2* 8.4*  MG 2.0  --  2.0 2.2 2.1  --   PHOS 5.9*  --  5.6* 5.7* 5.7*  --     Liver Function Tests: Recent Labs  Lab 09/24/23 1214 09/25/23 0602 09/25/23 2328  AST 23 17 19   ALT 14 12 11   ALKPHOS 76 69 62  BILITOT 0.6 0.6 0.7  PROT 7.4 7.1 6.6  ALBUMIN 2.5* 2.2* 2.2*   No results for input(s): "LIPASE", "AMYLASE" in the last 168 hours. Recent Labs  Lab 09/27/23 0859  AMMONIA 16     CBC: Recent Labs  Lab 09/24/23 1214 09/25/23 0602 09/25/23 2328 09/26/23 0125 09/27/23 0151 09/28/23 0435 09/29/23 0549  WBC 9.8 17.6* 12.6* 11.9* 10.0 9.6  9.8 8.9  NEUTROABS 6.3 16.1* 10.3*  --   --  7.4  --   HGB 10.9* 10.1* 9.3* 9.8* 10.3* 9.6*  9.5* 9.6*  HCT 36.1 33.4* 30.5* 31.6* 34.2* 32.2*  31.0* 31.2*  MCV 90.3 92.0 90.2 89.3 88.8 92.8  89.3 90.2  PLT 415* 398 361 364 360 317  317 309    Cardiac Enzymes: Recent Labs  Lab 09/25/23 0602 09/28/23 0533  CKTOTAL 36* 53    BNP: Invalid input(s): "POCBNP"  CBG: Recent Labs  Lab 09/28/23 1949 09/28/23 2341 09/29/23 0436 09/29/23 0845 09/29/23 1150  GLUCAP 108* 140* 202* 174* 123*    Microbiology: Results for orders placed or performed during the hospital encounter of 09/24/23  Resp panel by RT-PCR (RSV, Flu A&B, Covid) Anterior Nasal Swab     Status: None   Collection Time: 09/24/23 12:21 PM   Specimen: Anterior Nasal Swab  Result Value Ref Range Status   SARS Coronavirus 2 by RT PCR NEGATIVE NEGATIVE Final    Comment: (NOTE) SARS-CoV-2 target nucleic acids are NOT DETECTED.  The SARS-CoV-2 RNA is generally  detectable in upper respiratory specimens during the acute phase of infection. The lowest concentration of SARS-CoV-2 viral copies this assay can detect is 138 copies/mL. A negative result does not preclude SARS-Cov-2 infection and should not be used as the sole basis for treatment or other patient management decisions. A negative result may occur with  improper specimen collection/handling, submission of specimen other than nasopharyngeal swab, presence of viral mutation(s) within the areas targeted by this assay, and inadequate number of viral copies(<138 copies/mL). A negative result must be combined with clinical observations, patient history, and epidemiological information. The expected result is Negative.  Fact Sheet for Patients:  BloggerCourse.com  Fact  Sheet for Healthcare Providers:  SeriousBroker.it  This test is no t yet approved or cleared by the [place] FDA and  has been authorized for detection and/or diagnosis of SARS-CoV-2 by FDA under an Emergency Use Authorization (EUA). This EUA will remain  in effect (meaning this test can be used) for the duration of the COVID-19 declaration under Section 564(b)(1) of the Act, 21 U.S.C.section 360bbb-3(b)(1), unless the authorization is terminated  or revoked sooner.       Influenza A by PCR NEGATIVE NEGATIVE Final   Influenza B by PCR NEGATIVE NEGATIVE Final    Comment: (NOTE) The Xpert Xpress SARS-CoV-2/FLU/RSV plus assay is intended as an aid in the diagnosis of influenza from Nasopharyngeal swab specimens and should not be used as a sole basis for treatment. Nasal washings and aspirates are unacceptable for Xpert Xpress SARS-CoV-2/FLU/RSV testing.  Fact Sheet for Patients: BloggerCourse.com  Fact Sheet for Healthcare Providers: SeriousBroker.it  This test is not yet approved or cleared by the [place] FDA and has been authorized for detection and/or diagnosis of SARS-CoV-2 by FDA under an Emergency Use Authorization (EUA). This EUA will remain in effect (meaning this test can be used) for the duration of the COVID-19 declaration under Section 564(b)(1) of the Act, 21 U.S.C. section 360bbb-3(b)(1), unless the authorization is terminated or revoked.     Resp Syncytial Virus by PCR NEGATIVE NEGATIVE Final    Comment: (NOTE) Fact Sheet for Patients: BloggerCourse.com  Fact Sheet for Healthcare Providers: SeriousBroker.it  This test is not yet approved or cleared by the [place] FDA and has been authorized for detection and/or diagnosis of SARS-CoV-2 by FDA under an Emergency Use Authorization (EUA). This EUA will remain in effect  (meaning this test can be used) for the duration of the COVID-19 declaration under Section 564(b)(1) of the Act, 21 U.S.C. section 360bbb-3(b)(1), unless the authorization is terminated or revoked.  Performed at [facility], [address] [place], [place] [address]   Urine Culture     Status: None   Collection Time: 09/24/23 12:21 PM   Specimen: Urine, Random  Result Value Ref Range Status   Specimen Description   Final    URINE, RANDOM Performed at [facility], [address], [place], [place] [address]    Special Requests   Final    NONE Reflexed from [id number] Performed at [facility], [address], [place], [place] [address]    Culture   Final    NO GROWTH Performed at [facility], [address], [place], [place] [address]    Report Status 09/25/2023 FINAL  Final  Blood Culture (routine x 2)     Status: None   Collection Time: 09/24/23 12:22 PM   Specimen: BLOOD LEFT FOREARM  Result Value Ref Range Status   Specimen Description  BLOOD LEFT FOREARM  Final   Special Requests   Final    BOTTLES DRAWN AEROBIC ONLY Blood Culture results may not be optimal due to an inadequate volume of blood received in culture bottles   Culture   Final    NO GROWTH 5 DAYS Performed at [facility], [address] [place], [place] [address]    Report Status 09/29/2023 FINAL  Final  Blood Culture (routine x 2)     Status: None   Collection Time: 09/24/23 12:23 PM   Specimen: Left Antecubital; Blood  Result Value Ref Range Status   Specimen Description LEFT ANTECUBITAL  Final   Special Requests   Final    BOTTLES DRAWN AEROBIC AND ANAEROBIC Blood Culture adequate volume   Culture   Final    NO GROWTH 5 DAYS Performed at [facility], [address] [place], [place] [address]    Report Status 09/29/2023 FINAL  Final  MRSA Next Gen by PCR, Nasal     Status: None   Collection Time: 09/25/23  8:44 PM   Specimen: Nasal Mucosa;  Nasal Swab  Result Value Ref Range Status   MRSA by PCR Next Gen NOT DETECTED NOT DETECTED Final    Comment: (NOTE) The GeneXpert MRSA Assay (FDA approved for NASAL specimens only), is one component of a comprehensive MRSA colonization surveillance program. It is not intended to diagnose MRSA infection nor to guide or monitor treatment for MRSA infections. Test performance is not FDA approved in patients less than 72 years old. Performed at [facility], [address] [place], [place] [address]     Coagulation Studies: No results for input(s): "LABPROT", "INR" in the last 72 hours.   Urinalysis: No results for input(s): "COLORURINE", "LABSPEC", "PHURINE", "GLUCOSEU", "HGBUR", "BILIRUBINUR", "KETONESUR", "PROTEINUR", "UROBILINOGEN", "NITRITE", "LEUKOCYTESUR" in the last 72 hours.  Invalid input(s): "APPERANCEUR"    Imaging: CT ABDOMEN PELVIS WO CONTRAST Result Date: 09/27/2023 CLINICAL DATA:  Sepsis and abdominal pain. Suspect small bowel obstruction. EXAM: CT ABDOMEN AND PELVIS WITHOUT CONTRAST TECHNIQUE: Multidetector CT imaging of the abdomen and pelvis was performed following the standard protocol without IV contrast following administration of oral contrast only. RADIATION DOSE REDUCTION: This exam was performed according to the departmental dose-optimization program which includes automated exposure control, adjustment of the mA and/or kV according to patient size and/or use of iterative reconstruction technique. COMPARISON:  CT without contrast 04/02/2022, CT with IV contrast 10/30/2021. FINDINGS: Lower chest: The cardiac size is normal. The mitral ring is heavily calcified. There are three-vessel coronary calcifications. There is no pericardial effusion. There is patchy airspace consolidation throughout the lung fields most likely due to multifocal pneumonia. Trace bilateral pleural effusions. A Dobbhoff feeding tube extends to the left and posteriorly within the stomach  with the tip in the proximal lumen. Hepatobiliary: There is loss of fine detail due to respiratory motion. The liver is steatotic. No mass is seen through motion artifact. There are several rim calcified gallstones largest is 1.4 cm. No wall thickening or biliary dilatation. Pancreas: Partially atrophic. Otherwise unremarkable without contrast. Spleen: No abnormality is seen through the breathing motion. Adrenals/Urinary Tract: There is no adrenal mass. No new contour deforming renal abnormality is seen through the motion artifacts. There are Bosniak 1 cysts in the right kidney, in the upper pole measuring 3.2 cm and 18 Hounsfield units, in the lower pole measuring 1.4 cm and 1.8 Hounsfield units. No follow-up imaging is recommended. The unenhanced kidneys are otherwise unremarkable. There  is no urinary stone or obstruction. The bladder is catheterized, contracted and obscured by metal spray artifact from a right hip replacement. Stomach/Bowel: No dilatation or wall thickening. No bowel obstruction or inflammation is seen. Normal subcecal appendix. Scattered sigmoid diverticulosis without evidence of diverticulitis. Vascular/Lymphatic: Aortic atherosclerosis. No enlarged abdominal or pelvic lymph nodes. Reproductive: Uterus and bilateral adnexa are unremarkable. Other: There is evidence of recent injections into the subcutaneous abdominal wall fat. Bilateral stranding noted on the right greater than left in the redundant fatty soft tissues of the left and right abdominal wall, could be due to cellulitis or third spacing of fluid reserves and does not appear to have been present previously. Traumatic etiology could explain this in the appropriate clinical setting. There is trace presacral ascites. No other free fluid. There is no free hemorrhage, free air, or localizing inflammatory process. Musculoskeletal: Right hip arthroplasty. Osteopenia with advanced degenerative change of the spine. 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 Multilevel foraminal stenosis. No acute or other significant osseous findings. IMPRESSION: 1. Patchy airspace consolidation throughout the lung fields most likely due to multifocal pneumonia. 2. No bowel obstruction or inflammation. 3. Trace bilateral pleural effusions.  Trace presacral ascites. 4. Dobbhoff feeding tube tip in the proximal stomach. 5. Hepatic steatosis.  Cholelithiasis. 6. Diverticulosis without evidence of diverticulitis. 7. Bilateral stranding in the redundant superficial fatty soft tissues of the left and right abdominal wall, could be due to cellulitis or third spacing of fluid reserves. Traumatic etiology could explain this in the appropriate clinical setting. 8. Aortic and coronary artery atherosclerosis. 9. Osteopenia and advanced degenerative change of the spine. Electronically Signed   By: Almira Bar M.D.   On: 09/27/2023 21:20   EEG adult Result Date: 09/27/2023 Charlsie Quest, MD     09/27/2023  4:31 PM Patient Name: [name] MRN: [id number] Epilepsy Attending: Charlsie Quest Referring Physician/Provider: Gillis Santa, MD Date: 09/27/2023 Duration: 23.05 mins Patient history: 68yo F with ams. EEG to evaluate for seizure Level of alertness: awake/ lethargic AEDs during EEG study: None Technical aspects: This EEG study was done with scalp electrodes positioned according to the 10-20 International system of electrode placement. Electrical activity was reviewed with band pass filter of 1-70Hz , sensitivity of 7 uV/mm, display speed of 77mm/sec with a 60Hz  notched filter applied as appropriate. EEG data were recorded continuously and digitally stored.  Video monitoring was available and reviewed as appropriate. Description: EEG showed continuous generalized 3-5 Asatab and delta slowing. Generalized periodic discharges with triphasic morphology were noted at 1.5 to 2.5 Hz.  Photic driving was not seen during photic stimulation. Hyperventilation  was not performed.   ABNORMALITY -Periodic discharges with triphasic morphology, generalized - Continuous slow, generalized IMPRESSION: This study showed generalized periodic discharges with triphasic morphology.  These discharges can be on the ictal-interictal continuum.  However the frequency and morphology is most likely suggestive of toxic-metabolic causes like cefepime toxicity, renal dysfunction, hyperammonemia.  If concern for ictal-interictal activity persist, long-term EEG can be considered. Additionally there is moderate diffuse encephalopathy.  No seizures were noted. Priyanka Annabelle Harman     Medications:    feeding supplement (OSMOLITE 1.5 CAL) 20 mL/hr at 09/29/23 0458    acetaminophen (TYLENOL) oral liquid 160 mg/5 mL  650 mg Per Tube TID   Chlorhexidine Gluconate Cloth  6 each Topical Daily   free water  30 mL Per Tube Q4H   heparin injection (subcutaneous)  5,000 Units Subcutaneous Q8H  insulin aspart  0-15 Units Subcutaneous TID WC   insulin glargine-yfgn  15 Units Subcutaneous Daily   levothyroxine  100 mcg Oral Q0600   metoprolol tartrate  10 mg Intravenous Q6H   multivitamin with minerals  1 tablet Per Tube Daily   mouth rinse  15 mL Mouth Rinse 4 times per day   sodium chloride flush  3 mL Intravenous Q12H   thiamine  100 mg Per Tube Daily   HYDROmorphone (DILAUDID) injection, iohexol, naLOXone (NARCAN)  injection, ondansetron (ZOFRAN) IV, mouth rinse  Assessment/ Plan:  Ms. HARMANI NETO is a 68 y.o.  female with past medical history of chronic bilateral lower extremity wounds, left fibula osteomyelitis, HFrEF at last EF 45%,  chronic lymhedema, hypertension diabetes, and chronic kidney disease stage !!!b, who was admitted to [facility] on 09/24/2023 for Sepsis [facility]) [A41.9]   Acute Kidney Injury on chronic kidney disease stage IIIb with baseline creatinine 1.4 and GFR of 40 on 09/12/23.  Acute kidney injury secondary to sepsis from infectious process.  Creatinine was 2.75  on admission and has peaked today 3.81. Foley catheter in place. BUN elevated, unlikely to cause this level of confusion and somnolence.   Creatinine showed some improvement today.  Urine output poorly recorded.  Mental status appears to be improving, patient responding to intermittent questions.  No acute indication for dialysis.  Will continue to monitor renal indices.  Lab Results  Component Value Date   CREATININE 3.89 (H) 09/29/2023   CREATININE 4.02 (H) 09/28/2023   CREATININE 3.81 (H) 09/27/2023    Intake/Output Summary (Last 24 hours) at 09/29/2023 1217 Last data filed at 09/29/2023 1156 Gross per 24 hour  Intake 509.33 ml  Output 700 ml  Net -190.67 ml   2. Sepsis likely secondary to left fibular osteomyelitis. Reviewed by orthopedic surgery, no surgical intervention. Managing with wound care. Antibiotics completed.   3. Anemia of chronic kidney disease Lab Results  Component Value Date   HGB 9.6 (L) 09/29/2023    Hgb at goal. Will monitor for now  4. Diabetes mellitus type II with chronic kidney disease/renal manifestations: insulin dependent. Home regimen includes Lantus, novolog. Most recent hemoglobin A1c is 8.1 on 09/06/23.   Primary team will manage sliding scale insulin.   5. Hypertension with chronic kidney disease. Home regimen includes Lantus today.  Amlodipine, clonidine, furosemide, irbesartan, and metoprolol. Currently prescribed Metoprolol only.  Blood pressure 147/82   LOS: 5 Alissah Redmon 4/4/202512:17 PM

## 2023-09-29 NOTE — Consult Note (Signed)
 [facility] Liaison Note  09/29/2023  [name] Sep 08, 1955 [id number]  Location: RN Hospital Liaison screened the patient remotely at [facility].  Insurance: [employer]   [name] is a 68 y.o. female who is a Primary Care Patient of Pcp, No. The patient was screened for 30 day readmission hospitalization with noted high risk score for unplanned readmission risk with 2 IP in 6 months.  The patient was assessed for potential Care Management service needs for post hospital transition for care coordination. Review of patient's electronic medical record reveals patient was admitted with Sepsis. Pt arrived from [facility] with altered mental status changes. Unable to verify provider as pt unable to answer any inquires. TOC documentation attempting to establish a decision maker for further discussion on discharge planning needs. If pt returns to the facility the facility will continue to address her needs.  Plan: [facility] Liaison will continue to follow progress and disposition to asess for post hospital community care coordination/management needs.  Referral request for community care coordination: pending disposition.   VBCI Care Management/Population Health does not replace or interfere with any arrangements made by the Inpatient Transition of Care team.   For questions contact:   Elliot Cousin, RN, BSN Hospital Liaison [place]   [facility], Population Health Office Hours MTWF  8:00 am-6:00 pm Direct Dial: [phone] mobile [email or web address]

## 2023-09-29 NOTE — Evaluation (Signed)
 Speech Language Pathology Evaluation Patient Details Name: [name] MRN: [id number] DOB: 1955-09-16 Today's Date: 09/29/2023 Time: 5784-6962 SLP Time Calculation (min) (ACUTE ONLY): 10 min  Problem List:  Patient Active Problem List   Diagnosis Date Noted   Acute congestive heart failure (HCC) 09/26/2023   Acute encephalopathy 09/24/2023   Multiple open wounds of lower leg, initial encounter 09/12/2023   Thrombocytosis 09/10/2023   Anemia of chronic disease 09/10/2023   Osteomyelitis of left fibula (HCC) 09/09/2023   Type II diabetes mellitus with renal manifestations (HCC) 09/04/2023   Chronic systolic CHF (congestive heart failure) (HCC) 09/04/2023   Obesity, Class III, BMI 40-49.9 (morbid obesity) (HCC) 09/04/2023   DKA (diabetic ketoacidosis) (HCC) 10/30/2021   PVD (peripheral vascular disease) (HCC)    Vertigo    Sepsis without acute organ dysfunction (HCC)    Acute kidney injury superimposed on CKD (HCC)    Adjustment disorder with anxiety 04/21/2021   Sepsis (HCC) 04/17/2021   Pressure injury of skin 04/17/2021   Left leg pain 04/16/2021   Morbid obesity (HCC) 10/24/2017   Chronic kidney disease, stage IV (severe) (HCC) 10/24/2017   Hypothyroidism 11/15/2016   Edema 11/15/2016   Type 2 diabetes mellitus with hyperlipidemia (HCC) 11/15/2016   Essential hypertension 12/09/2015   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) 06/26/2015   Pressure ulcer 05/31/2015   Primary osteoarthritis of right hip 05/28/2015   Past Medical History:  Past Medical History:  Diagnosis Date   Acute congestive heart failure (HCC) 09/26/2023   Diabetes mellitus without complication (HCC) 2010   Hypertension    Hypothyroidism    Kidney stone 11/2014   history of/STAGE 4 KIDNEY DISEASE PER DR Wynelle Link   Lymphedema    Multiple open wounds of lower leg    PT BEING SEEN BY THE [facility] FOR CHRONIC BLISTERS PER PT   Past Surgical History:  Past  Surgical History:  Procedure Laterality Date   AMPUTATION Right 05/03/2021   Procedure: AMPUTATION RAY;  Surgeon: Rosetta Posner, DPM;  Location: [facility];  Service: Podiatry;  Laterality: Right;  RIght Big Toe and Right Fifth Toe   IRRIGATION AND DEBRIDEMENT FOOT Bilateral 05/03/2021   Procedure: IRRIGATION AND DEBRIDEMENT FEET;  Surgeon: Rosetta Posner, DPM;  Location: [facility];  Service: Podiatry;  Laterality: Bilateral;   LOWER EXTREMITY ANGIOGRAPHY Right 04/27/2021   Procedure: Lower Extremity Angiography;  Surgeon: Renford Dills, MD;  Location: [facility];  Service: Cardiovascular;  Laterality: Right;   LOWER EXTREMITY ANGIOGRAPHY Left 04/30/2021   Procedure: Lower Extremity Angiography;  Surgeon: Annice Needy, MD;  Location: [facility];  Service: Cardiovascular;  Laterality: Left;   TOTAL HIP ARTHROPLASTY Right 05/28/2015   Procedure: TOTAL HIP ARTHROPLASTY ANTERIOR APPROACH;  Surgeon: Kennedy Bucker, MD;  Location: [facility];  Service: Orthopedics;  Laterality: Right;   HPI:  68 y.o. 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  She presents from SNF with altered mental status and not eating and drinking for a few days on 09/24/2023. Pt had NG placed d/t inability to consume PO d/t AMS. ID was consulted and suspected cefepime toxicity (currently  held). MRI on 09/27/2023 revealed No acute intracranial abnormality.  Mild for age signal changes compatible with chronic small vessel disease. CT Abdomen on 09/27/2023 revealed Patchy airspace consolidation throughout the lung fields most likely due to multifocal pneumonia. 2. No bowel obstruction or inflammation.  3. Trace bilateral pleural effusions.  Trace presacral ascites.   Assessment / Plan / Recommendation Clinical Impression  ST services were consulted d/t continued AMS. Per chart review pt appears more alert and interactive today than previous days. She presents with basic focused attention, is oriented to self only, while she responded to basic yes/no questions related to herself, her responses are delayed. She is not aware of current medical condition with decreased recall of events related to hospitalization such as pulling her NG. At this time, ST services targeting cognition will follow peripherally.    SLP Assessment  SLP Recommendation/Assessment:  (ST services will treat for possible diet upgrade, peripherally for AMS) SLP Visit Diagnosis: Cognitive communication deficit (R41.841)    Recommendations for follow up therapy are one component of a multi-disciplinary discharge planning process, led by the attending physician.  Recommendations may be updated based on patient status, additional functional criteria and insurance authorization.    Follow Up Recommendations  Skilled nursing-short term rehab (<3 hours/day)    Assistance Recommended at Discharge  Frequent or constant Supervision/Assistance  Functional Status Assessment Patient has had a recent decline in their functional status and/or demonstrates limited ability to make significant improvements in function in a reasonable and predictable amount of time  Frequency and Duration min 2x/week         SLP Evaluation Cognition  Overall Cognitive Status: No family/caregiver present to determine baseline cognitive functioning Arousal/Alertness: Awake/alert Orientation Level: Oriented to person       Comprehension  Auditory Comprehension Overall Auditory Comprehension:  (difficult to assess)    Expression Expression Primary Mode of Expression: Verbal Verbal Expression Overall Verbal Expression: Appears within functional limits  for tasks assessed   Oral / Motor  Oral Motor/Sensory Function Overall Oral Motor/Sensory Function:  (grossly intact) Motor Speech Overall Motor Speech: Appears within functional limits for tasks assessed           Bowen Kia B. Dreama Saa, M.S., CCC-SLP, Tree surgeon Certified Brain Injury Specialist [facility]  [facility] Services Office [phone] Ascom [phone] Fax [phone]

## 2023-09-29 NOTE — Progress Notes (Signed)
 [facility] Progress Note  Patient: [name]    ZOX:[id number]  DOA: 09/24/2023     Date of Service: the patient was seen and examined on 09/29/2023  Chief Complaint  Patient presents with   Altered Mental Status   Brief hospital course:  [name] is a 68 y.o. female with medical history significant of chronic bilateral lower extremity wounds with recent diagnosis of left fibula osteomyelitis, HFrEF with last EF of 45%, chronic lymphedema, hypertension, type 2 diabetes, PAD, CKD stage IIIa, who presents to the ED due to altered mental status.   History obtained through chart review due to patient's altered mental status. Per facility staff at [facility], patient has become increasingly altered over the past 1 to 2 days and has been refusing to take her medications.  At baseline, she is alert and oriented x 4 with no underlying history of dementia.  No reports of fever.   Per chart review, patient was recently admitted on 09/04/2023 and was discovered to have a left fibula osteomyelitis.  Cultures grew Staph aureus and Providencia.  She was discharged on cefepime and daptomycin with plans to continue IV antibiotics through October 19, 2023.   ED course: On arrival to the ED, patient was normotensive at 117/104 with heart rate of 90.  She was saturating at 100% on room air.  She was afebrile at 98.3.  Initial workup notable for hemoglobin of 10.9, platelets 415, bicarb 21, BUN 38, creatinine 2.75, and GFR of 18.  Lactic acid 2.7.  Urinalysis with ketonuria, proteinuria, few bacteria.  CT head with no acute intracranial abnormalities.  Chest x-ray with low lung volumes.  Patient started on Rocephin, vancomycin, IV fluids and pain medication.  [facility] contacted for admission.   Assessment and Plan:  # Toxic metabolic encephalopathy possible due to cefepime and opiates Patient presented with altered mental status ABG reviewed, no CO2 retention. Avoid sedating medications and  avoid opiates PCCM consulted, patient received 1 dose of Narcan with some improvement in the mental status Rule out DKA, encephalopathy could be due to cefepime toxicity ID consulted, recommended to stop daptomycin and cefepime, patient already treated for 12 weeks.  Leg wounds are chronic.  4/1 Narcan 1 dose given, patient started moaning but still no significant improvement in her mental status.   Insert NG tube, consulted nutritionist to start enteral feeding Continue IV fluid for hydration CT head was negative on admission,  4/2 MRI brain negative for any acute findings 4/2 EEG: Moderate diffuse encephalopathy, no seizures were noted.  Toxic metabolic encephalopathy likely cefepime toxicity, renal dysfunction and hyperammonia. Ammonia level within normal range. 4/4 patient pulled out NG tube, SLP eval done, started on dysphagia 1 diet, with nectar thick liquids.  Mental status is improving   Paroxysmal A-fib with RVR developed on 4/1 in the morning S/p Cardizem 10 mg IV one-time dose given S/p Lopressor 5 mg IV x 2 doses given 4/2 Increased Lopressor IV 10 mg q6h Monitor BP and heart rate, titrate medications accordingly. 4/2 TTE shows LVEF 60-65%, no WMA mild LV hypertrophy, grade 1 diastolic dysfunction.  No significant valvular abnormality, negative PFO   # Sepsis secondary to left fibular osteomyelitis MRI LLE reviewed Patient was discharged on cefepime and daptomycin via PICC line to be continued through 10/19/2023 as per ID recommendation Seen by orthopedic surgery, recommended no surgical intervention Continue wound care Stoppod antibiotics as per ID  # AKI on CKD stage IIIb Baseline creatinine range 1.1-1.6  Creatinine 2.75 on admission Continue IV fluid for hydration Monitor urine output, avoid nephrotoxic medications 3/31 Foley catheter insertion to monitor intake and output 4/4 creatinine 3.89 slightly improved Continue IV fluid for hydration Follow nephrology for  further recommendation    # Hyperkalemia due to renal failure.  Hyperkalemia resolved Lokelma order placed per patient is unable to take oral due to AMS S/p D10, insulin 5 units and albuterol nebulizer Repeat BMP Continue to monitor on telemetry   # Metabolic acidosis due to renal failure S/p Bicarbonate 150 mEq IV given on 3/31 S/p bicarbonate 50 mEq x 1 dose given on 4/1 4/2 bicarb 50 mEq one-time IV push given Monitor electrolytes   # IDDM T2  Continue NovoLog sliding scale Monitor CBG Start diabetic diet when patient is able to swallow Held gabapentin due to altered mental status   # Chronic systolic CHF and hypertension, PAD Held home medications amlodipine 5 mg, Lasix 20 mg BID, Toprol-XL 12.5 mg, irbesartan 150 mg, clonidine 0.3 mg p.o. 3 times daily, Plavix 75 mg, Held home meds due to altered mental status and sepsis Monitor BP and resume medications as per improvement 4/4 Lopressor 50 mg p.o. twice daily, patient was on Lopressor 10 mg IV every 6 hourly when she was NPO.  We will switch her to oral today.  # Hypothyroid, TSH 0.1 very low and free T40.67 At lower and Patient was on Synthroid 200 mcg p.o. daily, decreased dose to 100 mcg p.o. daily when she will be able to swallow.  # Hyperphosphatemia due to renal failure, monitor electrolytes daily  # Chronic pain, patient is slightly more awake on 4/3 and she is crying and moaning seems in discomfort. Started Dilaudid 0.5 mg IV every 4 hourly as needed. Tylenol 650 mg 3 times daily via NG tube   Overall prognosis is poor, patient remains at high risk to deteriorate and patient is full code. PCCM was consulted on 3/31 Palliative care consulted for goals of care discussion, patient has no immediate family, she has a friend so palliative will try to contact her and make final decisions.    Body mass index is 48.17 kg/m.  Interventions:  Pressure Injury 04/17/21 Sacrum Medial Stage 2 -  Partial thickness loss  of dermis presenting as a shallow open injury with a red, pink wound bed without slough. (Active)  04/17/21 0444  Location: Sacrum  Location Orientation: Medial  Staging: Stage 2 -  Partial thickness loss of dermis presenting as a shallow open injury with a red, pink wound bed without slough.  Wound Description (Comments):   Present on Admission: Yes     Pressure Injury 04/17/21 Heel Left;Posterior Unstageable - Full thickness tissue loss in which the base of the injury is covered by slough (yellow, tan, gray, green or brown) and/or eschar (tan, brown or black) in the wound bed. (Active)  04/17/21   Location: Heel  Location Orientation: Left;Posterior  Staging: Unstageable - Full thickness tissue loss in which the base of the injury is covered by slough (yellow, tan, gray, green or brown) and/or eschar (tan, brown or black) in the wound bed.  Wound Description (Comments):   Present on Admission: Yes     Diet: Carb modified diet when able to swallow DVT Prophylaxis: Subcutaneous Heparin    Advance goals of care discussion: DNR/Limited code  CODE STATUS discussed with patient's close friend on fortunately, she agreed to keep her DNR DNI.  Patient does not have any immediate family member.  Family Communication: family was not present at bedside, at the time of interview.  The pt provided permission to discuss medical plan with the family. Opportunity was given to ask question and all questions were answered satisfactorily.  4/3 management plan discussed with patient's friend at bedside.  Disposition:  Pt is from SNF, admitted with AMS due to cefepime toxicity, still has AMS, which precludes a safe discharge. Discharge to SNF, when stable, may need few days to improve. Prognosis is poor, patient may need hospice care.  Subjective: No significant events overnight, patient mental status gradually improving, she is able to tell me her first name.  She is trying to move her upper extremities  but unable to move actively. Patient removed NG tube, seen by SLP, diet started.  Physical Exam: General: Not in acute distress, she is more awake today, AAO x 1 knows her first name Eyes: PERRLA  ENT: Oral Mucosa Clear, but dry Neck: no JVD,  Cardiovascular: S1 and S2 Present, no Murmur,  Respiratory: Equal air entry bilaterally, No significant crackles or wheezes Abdomen: Bowel Sound present, Soft and no tenderness,  Skin: Bilateral chronic leg wounds, wrapped with dressing Extremities: 4+ Pedal edema, no calf tenderness Neurologic: AAO x 1 knows her first name.  More awake as compared to yesterday.  Moving upper extremities.  Still seems very weak and lethargic.   Gait not checked due to patient safety concerns  Vitals:   09/29/23 0000 09/29/23 0200 09/29/23 0703 09/29/23 1151  BP: (!) 166/89 (!) 160/82    Pulse: 89 99    Resp: 15 16    Temp: 98.2 F (36.8 C)   98 F (36.7 C)  TempSrc: Axillary     SpO2: 96% 96%    Weight:   131.3 kg   Height:        Intake/Output Summary (Last 24 hours) at 09/29/2023 1500 Last data filed at 09/29/2023 1300 Gross per 24 hour  Intake 559.33 ml  Output 700 ml  Net -140.67 ml   Filed Weights   09/27/23 0529 09/28/23 0500 09/29/23 0703  Weight: 133.7 kg 131.9 kg 131.3 kg    Data Reviewed: I have personally reviewed and interpreted daily labs, tele strips, imagings as discussed above. I reviewed all nursing notes, pharmacy notes, vitals, pertinent old records I have discussed plan of care as described above with RN and patient/family.  CBC: Recent Labs  Lab 09/24/23 1214 09/25/23 0602 09/25/23 2328 09/26/23 0125 09/27/23 0151 09/28/23 0435 09/29/23 0549  WBC 9.8 17.6* 12.6* 11.9* 10.0 9.6  9.8 8.9  NEUTROABS 6.3 16.1* 10.3*  --   --  7.4  --   HGB 10.9* 10.1* 9.3* 9.8* 10.3* 9.6*  9.5* 9.6*  HCT 36.1 33.4* 30.5* 31.6* 34.2* 32.2*  31.0* 31.2*  MCV 90.3 92.0 90.2 89.3 88.8 92.8  89.3 90.2  PLT 415* 398 361 364 360 317  317  309   Basic Metabolic Panel: Recent Labs  Lab 09/25/23 1351 09/25/23 2328 09/26/23 0125 09/27/23 0151 09/28/23 0435 09/29/23 0549  NA 140 140 141 140 145 144  K 4.9 4.6 4.4 4.6 4.4 4.0  CL 107 109 110 106 111 111  CO2 17* 19* 19* 19* 22 23  GLUCOSE 264* 181* 170* 171* 85 197*  BUN 45* 45* 45* 50* 50* 51*  CREATININE 3.23* 3.29* 3.33* 3.81* 4.02* 3.89*  CALCIUM 8.7* 8.5* 8.4* 8.5* 8.2* 8.4*  MG 2.0  --  2.0 2.2 2.1  --   PHOS 5.9*  --  5.6* 5.7* 5.7*  --     Studies: No results found.   Scheduled Meds:  acetaminophen (TYLENOL) oral liquid 160 mg/5 mL  650 mg Oral TID   Chlorhexidine Gluconate Cloth  6 each Topical Daily   heparin injection (subcutaneous)  5,000 Units Subcutaneous Q8H   insulin aspart  0-15 Units Subcutaneous TID WC   insulin glargine-yfgn  15 Units Subcutaneous Daily   levothyroxine  100 mcg Oral Q0600   metoprolol tartrate  10 mg Intravenous Q6H   [START ON 09/30/2023] multivitamin with minerals  1 tablet Oral Daily   mouth rinse  15 mL Mouth Rinse 4 times per day   sodium chloride flush  3 mL Intravenous Q12H   [START ON 09/30/2023] thiamine  100 mg Oral Daily   Continuous Infusions:    PRN Meds: HYDROmorphone (DILAUDID) injection, iohexol, naLOXone (NARCAN)  injection, ondansetron (ZOFRAN) IV, mouth rinse  Time spent: 55 minutes  Author: Gillis Santa. MD [facility] 09/29/2023 3:00 PM  To reach On-call, see care teams to locate the attending and reach out to them via [email or web address]. If 7PM-7AM, please contact night-coverage If you still have difficulty reaching the attending provider, please page the [facility] (Director on Call) for [facility] on amion for assistance.

## 2023-09-29 NOTE — Progress Notes (Signed)
 Patient is more awake compared to yesterday 4/3, answers YES or NO questions.

## 2023-09-29 NOTE — Evaluation (Signed)
 Clinical/Bedside Swallow Evaluation Patient Details  Name: [name] MRN: [id number] Date of Birth: 1956/01/03  Today's Date: 09/29/2023 Time: SLP Start Time (ACUTE ONLY): 1108 SLP Stop Time (ACUTE ONLY): 1118 SLP Time Calculation (min) (ACUTE ONLY): 10 min  Past Medical History:  Past Medical History:  Diagnosis Date   Acute congestive heart failure (HCC) 09/26/2023   Diabetes mellitus without complication (HCC) 2010   Hypertension    Hypothyroidism    Kidney stone 11/2014   history of/STAGE 4 KIDNEY DISEASE PER DR Wynelle Link   Lymphedema    Multiple open wounds of lower leg    PT BEING SEEN BY [facility]RON[facility] BLISTERS PER PT   Past Surgical History:  Past Surgical History:  Procedure Laterality Date   AMPUTATION Right 05/03/2021   Procedure: AMPUTATION RAY;  Surgeon: Rosetta Posner, DPM;  Location: [facility];  Service: Podiatry;  Laterality: Right;  RIght Big Toe and Right Fifth Toe   IRRIGATION AND DEBRIDEMENT FOOT Bilateral 05/03/2021   Procedure: IRRIGATION AND DEBRIDEMENT FEET;  Surgeon: Rosetta Posner, DPM;  Location: [facility];  Service: Podiatry;  Laterality: Bilateral;   LOWER EXTREMITY ANGIOGRAPHY Right 04/27/2021   Procedure: Lower Extremity Angiography;  Surgeon: Renford Dills, MD;  Location: [facility];  Service: Cardiovascular;  Laterality: Right;   LOWER EXTREMITY ANGIOGRAPHY Left 04/30/2021   Procedure: Lower Extremity Angiography;  Surgeon: Annice Needy, MD;  Location: [facility];  Service: Cardiovascular;  Laterality: Left;   TOTAL HIP ARTHROPLASTY Right 05/28/2015   Procedure: TOTAL HIP ARTHROPLASTY ANTERIOR APPROACH;  Surgeon: Kennedy Bucker, MD;  Location: [facility];  Service: Orthopedics;  Laterality: Right;   HPI:  68 y.o. 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  She presents from SNF with altered mental status and not eating and drinking for a few days on 09/24/2023. Pt had NG placed d/t inability to consume PO d/t AMS. ID was consulted and suspected cefepime toxicity (currently held). MRI on 09/27/2023 revealed No acute intracranial abnormality.  Mild for age signal changes compatible with chronic small vessel disease. CT Abdomen on 09/27/2023 revealed Patchy airspace consolidation throughout the lung fields most likely due to multifocal pneumonia. 2. No bowel obstruction or inflammation. 3. Trace bilateral pleural effusions.  Trace presacral ascites.    Assessment / Plan / Recommendation  Clinical Impression  ST services were consulted as pt pulled her NG tube on 09/29/2023. While pt is awake, alert, she continues with confusion. At this time, pt presents with what is likely a cognition based dysphagia. As such, she has increased risk of aspiration when consuming thin liquids via cup d/t consistently delayed coughing and throat clearing, suspect d/t potential delayed swallow response. all overt s/s of aspiration are eliminated when consuming nectar thick liquids via cup. When consuming puree, pt presents with adequate abilities. Solids were not trials during this evaluation d/t lack of dentition and AMS. Recommend initial conservative diet of dysphagia 1 with nectar thick liquids, medicine crushed in puree. ST services to follow for the potential for further diet advancement. SLP Visit Diagnosis: Dysphagia, oropharyngeal phase (R13.12)    Aspiration Risk  Mild aspiration risk  Diet Recommendation Dysphagia 1 (Puree);Nectar-thick liquid    Liquid Administration via: Cup Medication Administration: Crushed with puree Supervision: Staff to assist with self feeding;Full supervision/cueing for  compensatory strategies Compensations: Minimize environmental distractions;Slow rate;Small sips/bites Postural Changes: Seated upright at 90 degrees;Remain upright for at least 30 minutes after po intake    Other  Recommendations Oral Care Recommendations: Oral care QID Caregiver Recommendations: Avoid jello, ice cream, thin soups, popsicles;Remove water pitcher;Have oral suction available    Recommendations for follow up therapy are one component of a multi-disciplinary discharge planning process, led by the attending physician.  Recommendations may be updated based on patient status, additional functional criteria and insurance authorization.  Follow up Recommendations Skilled nursing-short term rehab (<3 hours/day)      Assistance Recommended at Discharge  N/A  Functional Status Assessment Patient has had a recent decline in their functional status and/or demonstrates limited ability to make significant improvements in function in a reasonable and predictable amount of time  Frequency and Duration min 2x/week  2 weeks       Prognosis Prognosis for improved oropharyngeal function: Fair Barriers to Reach Goals: Cognitive deficits      Swallow Study   General Date of Onset: 09/24/23 HPI: 68 y.o. 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  She presents from SNF with altered mental status and not eating and drinking for a few days on 09/24/2023. Pt had NG placed d/t inability to consume PO d/t AMS. ID was consulted and suspected cefepime toxicity (currently held). MRI on 09/27/2023 revealed No acute intracranial abnormality.  Mild for age signal changes compatible with  chronic small vessel disease. CT Abdomen on 09/27/2023 revealed Patchy airspace consolidation throughout the lung fields most likely due to multifocal pneumonia. 2. No bowel obstruction or inflammation. 3. Trace bilateral pleural effusions.  Trace presacral ascites. Type of Study: Bedside Swallow Evaluation Previous Swallow Assessment: none in chart Diet Prior to this Study: NPO Temperature Spikes Noted: No Respiratory Status: Room air History of Recent Intubation: No Behavior/Cognition: Alert;Confused;Distractible;Requires cueing;Doesn't follow directions Oral Cavity Assessment: Within Functional Limits Oral Care Completed by SLP: Recent completion by staff Oral Cavity - Dentition: Edentulous Self-Feeding Abilities: Total assist Patient Positioning: Upright in bed Baseline Vocal Quality: Normal Volitional Cough: Cognitively unable to elicit Volitional Swallow: Unable to elicit    Oral/Motor/Sensory Function Overall Oral Motor/Sensory Function:  (grossly adequate)   Ice Chips Ice chips: Within functional limits Presentation: Spoon   Thin Liquid Thin Liquid: Impaired Presentation: Cup;Spoon Pharyngeal  Phase Impairments: Suspected delayed Swallow;Throat Clearing - Delayed;Cough - Delayed    Nectar Thick Nectar Thick Liquid: Within functional limits Presentation: Cup;Spoon   Honey Thick Honey Thick Liquid: Not tested   Puree Puree: Within functional limits Presentation: Spoon   Solid     Solid: Not tested     Sanav Remer B. Dreama Saa, M.S., CCC-SLP, Tree surgeon Certified Brain Injury Specialist [facility]  [facility] Office [phone] Ascom [phone] Fax [phone]

## 2023-09-29 NOTE — Progress Notes (Signed)
 Date of Admission:  09/24/2023      ID: [name] is a 68 y.o. female Principal Problem:   Sepsis (HCC) Active Problems:   Essential hypertension   Hypothyroidism   Acute kidney injury superimposed on CKD (HCC)   PVD (peripheral vascular disease) (HCC)   Type II diabetes mellitus with renal manifestations (HCC)   Chronic systolic CHF (congestive heart failure) (HCC)   Osteomyelitis of left fibula (HCC)   Acute encephalopathy   Acute congestive heart failure (HCC)    Subjective: Pt remains obtunded Opens eyes to stimuli   Medications:   acetaminophen (TYLENOL) oral liquid 160 mg/5 mL  650 mg Per Tube TID   Chlorhexidine Gluconate Cloth  6 each Topical Daily   feeding supplement (PROSource TF20)  60 mL Per Tube BID   free water  30 mL Per Tube Q4H   heparin injection (subcutaneous)  5,000 Units Subcutaneous Q8H   insulin aspart  0-15 Units Subcutaneous TID WC   insulin glargine-yfgn  15 Units Subcutaneous Daily   levothyroxine  100 mcg Oral Q0600   metoprolol tartrate  10 mg Intravenous Q6H   multivitamin with minerals  1 tablet Per Tube Daily   mouth rinse  15 mL Mouth Rinse 4 times per day   sodium chloride flush  3 mL Intravenous Q12H   thiamine  100 mg Per Tube Daily    Objective: Vital signs in last 24 hours: Patient Vitals for the past 24 hrs:  BP Temp Temp src Pulse Resp SpO2 Weight  09/28/23 1600 (!) 158/71 98.5 F (36.9 C) Oral 90 11 99 % --  09/28/23 1322 (!) 148/117 99 F (37.2 C) Oral -- 15 96 % --  09/28/23 1000 (!) 169/74 -- -- (!) 103 14 99 % --  09/28/23 0800 (!) 158/80 -- Oral (!) 103 19 100 % --  09/28/23 0500 -- -- -- -- -- -- 131.9 kg      PHYSICAL EXAM:  General: obtunded, opens eyes to stimuli No distress  PERL Neck: Supple, symmetrical, no adenopathy, thyroid: non tender no carotid bruit and no JVD. Lungs: Clear to auscultation bilaterally. No Wheezing or Rhonchi. No rales. Heart: Regular rate and rhythm, no murmur, rub or  gallop. Abdomen: Soft, non-tender,not distended. Bowel sounds normal. No masses Extremities: b/l leg wounds Skin: No rashes or lesions. Or bruising Lymph: Cervical, supraclavicular normal. Neurologic: cannot assess  Lab Results    Latest Ref Rng & Units 09/28/2023    4:35 AM 09/27/2023    1:51 AM 09/26/2023    1:25 AM  CBC  WBC 4.0 - 10.5 K/uL 4.0 - 10.5 K/uL 9.8    9.6  10.0  11.9   Hemoglobin 12.0 - 15.0 g/dL 40.9 - 81.1 g/dL 9.5    9.6  91.4  9.8   Hematocrit 36.0 - 46.0 % 36.0 - 46.0 % 31.0    32.2  34.2  31.6   Platelets 150 - 400 K/uL 150 - 400 K/uL 317    317  360  364        Latest Ref Rng & Units 09/28/2023    4:35 AM 09/27/2023    1:51 AM 09/26/2023    1:25 AM  CMP  Glucose 70 - 99 mg/dL 85  782  956   BUN 8 - 23 mg/dL 50  50  45   Creatinine 0.44 - 1.00 mg/dL 2.13  0.86  5.78   Sodium 135 - 145 mmol/L 145  140  141   Potassium 3.5 - 5.1 mmol/L 4.4  4.6  4.4   Chloride 98 - 111 mmol/L 111  106  110   CO2 22 - 32 mmol/L 22  19  19    Calcium 8.9 - 10.3 mg/dL 8.2  8.5  8.4       Microbiology: [facility]  Studies/Results: CT ABDOMEN PELVIS WO CONTRAST Result Date: 09/27/2023 CLINICAL DATA:  Sepsis and abdominal pain. Suspect small bowel obstruction. EXAM: CT ABDOMEN AND PELVIS WITHOUT CONTRAST TECHNIQUE: Multidetector CT imaging of the abdomen and pelvis was performed following the standard protocol without IV contrast following administration of oral contrast only. RADIATION DOSE REDUCTION: This exam was performed according to the departmental dose-optimization program which includes automated exposure control, adjustment of the mA and/or kV according to patient size and/or use of iterative reconstruction technique. COMPARISON:  CT without contrast 04/02/2022, CT with IV contrast 10/30/2021. FINDINGS: Lower chest: The cardiac size is normal. The mitral ring is heavily calcified. There are three-vessel coronary calcifications. There is no pericardial effusion. There is patchy  airspace consolidation throughout the lung fields most likely due to multifocal pneumonia. Trace bilateral pleural effusions. A Dobbhoff feeding tube extends to the left and posteriorly within the stomach with the tip in the proximal lumen. Hepatobiliary: There is loss of fine detail due to respiratory motion. The liver is steatotic. No mass is seen through motion artifact. There are several rim calcified gallstones largest is 1.4 cm. No wall thickening or biliary dilatation. Pancreas: Partially atrophic. Otherwise unremarkable without contrast. Spleen: No abnormality is seen through the breathing motion. Adrenals/Urinary Tract: There is no adrenal mass. No new contour deforming renal abnormality is seen through the motion artifacts. There are Bosniak 1 cysts in the right kidney, in the upper pole measuring 3.2 cm and 18 Hounsfield units, in the lower pole measuring 1.4 cm and 1.8 Hounsfield units. No follow-up imaging is recommended. The unenhanced kidneys are otherwise unremarkable. There is no urinary stone or obstruction. The bladder is catheterized, contracted and obscured by metal spray artifact from a right hip replacement. Stomach/Bowel: No dilatation or wall thickening. No bowel obstruction or inflammation is seen. Normal subcecal appendix. Scattered sigmoid diverticulosis without evidence of diverticulitis. Vascular/Lymphatic: Aortic atherosclerosis. No enlarged abdominal or pelvic lymph nodes. Reproductive: Uterus and bilateral adnexa are unremarkable. Other: There is evidence of recent injections into the subcutaneous abdominal wall fat. Bilateral stranding noted on the right greater than left in the redundant fatty soft tissues of the left and right abdominal wall, could be due to cellulitis or third spacing of fluid reserves and does not appear to have been present previously. Traumatic etiology could explain this in the appropriate clinical setting. There is trace presacral ascites. No other free  fluid. There is no free hemorrhage, free air, or localizing inflammatory process. Musculoskeletal: Right hip arthroplasty. Osteopenia with advanced degenerative change of the spine. Left hip DJD. Multilevel acquired lumbar spinal stenosis. Multilevel foraminal stenosis. No acute or other significant osseous findings. IMPRESSION: 1. Patchy airspace consolidation throughout the lung fields most likely due to multifocal pneumonia. 2. No bowel obstruction or inflammation. 3. Trace bilateral pleural effusions.  Trace presacral ascites. 4. Dobbhoff feeding tube tip in the proximal stomach. 5. Hepatic steatosis.  Cholelithiasis. 6. Diverticulosis without evidence of diverticulitis. 7. Bilateral stranding in the redundant superficial fatty soft tissues of the left and right abdominal wall, could be due to cellulitis or third spacing of fluid reserves. Traumatic etiology could explain this in the appropriate clinical  setting. 8. Aortic and coronary artery atherosclerosis. 9. Osteopenia and advanced degenerative change of the spine. Electronically Signed   By: Almira Bar M.D.   On: 09/27/2023 21:20   EEG adult Result Date: 09/27/2023 Charlsie Quest, MD     09/27/2023  4:31 PM Patient Name: ANSHI JALLOH MRN: [id number] Epilepsy Attending: Charlsie Quest Referring Physician/Provider: Gillis Santa, MD Date: 09/27/2023 Duration: 23.05 mins Patient history: 68yo F with ams. EEG to evaluate for seizure Level of alertness: awake/ lethargic AEDs during EEG study: None Technical aspects: This EEG study was done with scalp electrodes positioned according to the 10-20 International system of electrode placement. Electrical activity was reviewed with band pass filter of 1-70Hz , sensitivity of 7 uV/mm, display speed of 15mm/sec with a 60Hz  notched filter applied as appropriate. EEG data were recorded continuously and digitally stored.  Video monitoring was available and reviewed as appropriate. Description: EEG showed  continuous generalized 3-5 Asatab and delta slowing. Generalized periodic discharges with triphasic morphology were noted at 1.5 to 2.5 Hz.  Photic driving was not seen during photic stimulation. Hyperventilation was not performed.   ABNORMALITY -Periodic discharges with triphasic morphology, generalized - Continuous slow, generalized IMPRESSION: This study showed generalized periodic discharges with triphasic morphology.  These discharges can be on the ictal-interictal continuum.  However the frequency and morphology is most likely suggestive of toxic-metabolic causes like cefepime toxicity, renal dysfunction, hyperammonemia.  If concern for ictal-interictal activity persist, long-term EEG can be considered. Additionally there is moderate diffuse encephalopathy.  No seizures were noted. Charlsie Quest   DG Abd 1 View Result Date: 09/27/2023 CLINICAL DATA:  Enteric catheter placement EXAM: ABDOMEN - 1 VIEW COMPARISON:  09/26/2023 FINDINGS: Frontal view of the left hemiabdomen was obtained, excluding the right hemiabdomen and pelvis by collimation. Enteric catheter identified passing below diaphragm, tip projecting over the gastric fundus. Multiple distended gas-filled loops of small bowel are seen within the central abdomen, measuring up to 3 cm in diameter. Paucity of colonic gas. IMPRESSION: 1. Enteric catheter tip projecting over the gastric fundus. 2. Diffuse gaseous distention of the small bowel with relative paucity of colonic gas, which may reflect small-bowel obstruction. Electronically Signed   By: Sharlet Salina M.D.   On: 09/27/2023 14:43   ECHOCARDIOGRAM COMPLETE Result Date: 09/27/2023    ECHOCARDIOGRAM REPORT   Patient Name:   CHALISA KOBLER Date of Exam: 09/27/2023 Medical Rec #:  [id number]         Height:       65.0 in Accession #:    [id number]        Weight:       294.8 lb Date of Birth:  Feb 26, 1956          BSA:          2.333 m Patient Age:    68 years          BP:           166/86 mmHg  Patient Gender: F                 HR:           108 bpm. Exam Location:  [facility] Procedure: 2D Echo, Cardiac Doppler, Color Doppler and Intracardiac            Opacification Agent (Both Spectral and Color Flow Doppler were            utilized during procedure). Indications:     CHF  History:  Patient has no prior history of Echocardiogram examinations.                  CHF, Signs/Symptoms:Edema; Risk Factors:Hypertension and                  Diabetes.  Sonographer:     Mikki Harbor Referring Phys:  [id number] Andris Baumann Diagnosing Phys: Debbe Odea MD  Sonographer Comments: Technically difficult study due to poor echo windows and patient is obese. IMPRESSIONS  1. Left ventricular ejection fraction, by estimation, is 60 to 65%. The left ventricle has normal function. The left ventricle has no regional wall motion abnormalities. There is mild left ventricular hypertrophy. Left ventricular diastolic parameters are consistent with Grade I diastolic dysfunction (impaired relaxation).  2. Right ventricular systolic function is normal. The right ventricular size is normal.  3. The mitral valve is degenerative. Mild mitral valve regurgitation.  4. The aortic valve was not well visualized. Aortic valve regurgitation is not visualized. Aortic valve sclerosis/calcification is present, without any evidence of aortic stenosis. Aortic valve mean gradient measures 6.7 mmHg. FINDINGS  Left Ventricle: Left ventricular ejection fraction, by estimation, is 60 to 65%. The left ventricle has normal function. The left ventricle has no regional wall motion abnormalities. Definity contrast agent was given IV to delineate the left ventricular  endocardial borders. Strain was performed and the global longitudinal strain is indeterminate. The left ventricular internal cavity size was normal in size. There is mild left ventricular hypertrophy. Left ventricular diastolic parameters are consistent  with Grade I diastolic  dysfunction (impaired relaxation). Right Ventricle: The right ventricular size is normal. No increase in right ventricular wall thickness. Right ventricular systolic function is normal. Left Atrium: Left atrial size was normal in size. Right Atrium: Right atrial size was normal in size. Pericardium: There is no evidence of pericardial effusion. Mitral Valve: The mitral valve is degenerative in appearance. Mild mitral valve regurgitation. MV peak gradient, 13.2 mmHg. The mean mitral valve gradient is 8.0 mmHg. Tricuspid Valve: The tricuspid valve is normal in structure. Tricuspid valve regurgitation is not demonstrated. Aortic Valve: The aortic valve was not well visualized. Aortic valve regurgitation is not visualized. Aortic valve sclerosis/calcification is present, without any evidence of aortic stenosis. Aortic valve mean gradient measures 6.7 mmHg. Aortic valve peak gradient measures 12.3 mmHg. Aortic valve area, by VTI measures 2.76 cm. Pulmonic Valve: The pulmonic valve was normal in structure. Pulmonic valve regurgitation is not visualized. Aorta: The aortic root is normal in size and structure. IAS/Shunts: No atrial level shunt detected by color flow Doppler. Additional Comments: 3D was performed not requiring image post processing on an independent workstation and was indeterminate.  LEFT VENTRICLE PLAX 2D LVIDd:         4.40 cm   Diastology LVIDs:         1.90 cm   LV e' medial:    7.40 cm/s LV PW:         1.30 cm   LV E/e' medial:  16.8 LV IVS:        1.40 cm   LV e' lateral:   9.25 cm/s LVOT diam:     2.00 cm   LV E/e' lateral: 13.4 LV SV:         97 LV SV Index:   42 LVOT Area:     3.14 cm  RIGHT VENTRICLE RV Basal diam:  3.25 cm RV Mid diam:    2.60 cm RV S prime:  15.00 cm/s LEFT ATRIUM             Index        RIGHT ATRIUM           Index LA diam:        3.50 cm 1.50 cm/m   RA Area:     12.90 cm LA Vol (A2C):   56.1 ml 24.05 ml/m  RA Volume:   32.60 ml  13.98 ml/m LA Vol (A4C):   46.7 ml  20.02 ml/m LA Biplane Vol: 53.5 ml 22.94 ml/m  AORTIC VALVE                     PULMONIC VALVE AV Area (Vmax):    2.61 cm      PV Vmax:       1.43 m/s AV Area (Vmean):   2.66 cm      PV Peak grad:  8.2 mmHg AV Area (VTI):     2.76 cm AV Vmax:           175.67 cm/s AV Vmean:          120.667 cm/s AV VTI:            0.352 m AV Peak Grad:      12.3 mmHg AV Mean Grad:      6.7 mmHg LVOT Vmax:         146.00 cm/s LVOT Vmean:        102.000 cm/s LVOT VTI:          0.310 m LVOT/AV VTI ratio: 0.88  AORTA Ao Root diam: 3.30 cm Ao Asc diam:  3.40 cm MITRAL VALVE MV Area (PHT): 3.37 cm     SHUNTS MV Area VTI:   2.37 cm     Systemic VTI:  0.31 m MV Peak grad:  13.2 mmHg    Systemic Diam: 2.00 cm MV Mean grad:  8.0 mmHg MV Vmax:       1.82 m/s MV Vmean:      136.0 cm/s MV Decel Time: 225 msec MV E velocity: 124.00 cm/s MV A velocity: 148.00 cm/s MV E/A ratio:  0.84 Debbe Odea MD Electronically signed by Debbe Odea MD Signature Date/Time: 09/27/2023/12:52:04 PM    Final    MR BRAIN WO CONTRAST Result Date: 09/27/2023 CLINICAL DATA:  68 year old female with altered mental status. EXAM: MRI HEAD WITHOUT CONTRAST TECHNIQUE: Multiplanar, multiecho pulse sequences of the brain and surrounding structures were obtained without intravenous contrast. COMPARISON:  Head CT 09/24/2023 and earlier. FINDINGS: Brain: No restricted diffusion to suggest acute infarction. No midline shift, mass effect, evidence of mass lesion, ventriculomegaly, extra-axial collection or acute intracranial hemorrhage. Cervicomedullary junction and pituitary are within normal limits. Cerebral volume is within normal limits for age. Mild for age scattered white matter T2 and FLAIR hyperintensity primarily in the corona radiata. Similar heterogeneity in the pons, more so on the right. No cortical encephalomalacia. No chronic cerebral blood products identified on motion degraded SWI. Deep gray nuclei and cerebellum appear normal. Vascular: Major  intracranial vascular flow voids are preserved. Skull and upper cervical spine: Negative. Visualized bone marrow signal is within normal limits. Sinuses/Orbits: Negative. Other: Visible internal auditory structures appear normal. Negative visible scalp and face. IMPRESSION: 1. No acute intracranial abnormality. 2. Mild for age signal changes compatible with chronic small vessel disease. Electronically Signed   By: Odessa Fleming M.D.   On: 09/27/2023 10:14     Assessment/Plan: 68 y.o. 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 She presents from SNF with altered mental status and not eating and drinking for a few days? ? ?   Acute toxic encephalopathy- due to AKI on CKD and CNS effect of opioids and cefepime causing encephalopathy, myoclonus.  This does not look like sepsis clinically esepcially she was  on broad spectrum antibiotics  will hold antibiotics till her mental status clears She may not need Iv antibiotics for the osteo as it is unlikely to heal because of her immobility and pressure wounds MRI Okay  EEG pending Also has had high BP- PRES will be a concern but MRI without contrast does not show that ? Neuroconsult     AKI on CKD- worsening   Paroxysmal afib CT abdomn revealed b/l lung infiltrate- need a dedicated imaging of chest   Obesity   Bed bound status    B/l venous wounds legs With osteo of left leg- so far has received 12 days of IV- holding antibiotics now  Hypothyroidism     Discussed the management with hospitalist

## 2023-09-29 NOTE — Progress Notes (Signed)
 Date of Admission:  09/24/2023      ID: [name] is a 68 y.o. female Principal Problem:   Sepsis (HCC) Active Problems:   Essential hypertension   Hypothyroidism   Acute kidney injury superimposed on CKD (HCC)   PVD (peripheral vascular disease) (HCC)   Type II diabetes mellitus with renal manifestations (HCC)   Chronic systolic CHF (congestive heart failure) (HCC)   Osteomyelitis of left fibula (HCC)   Acute encephalopathy   Acute congestive heart failure (HCC)    Subjective: More alert today Says she is okay   Medications:   acetaminophen (TYLENOL) oral liquid 160 mg/5 mL  650 mg Oral TID   Chlorhexidine Gluconate Cloth  6 each Topical Daily   heparin injection (subcutaneous)  5,000 Units Subcutaneous Q8H   insulin aspart  0-15 Units Subcutaneous TID WC   insulin glargine-yfgn  15 Units Subcutaneous Daily   levothyroxine  100 mcg Oral Q0600   metoprolol tartrate  50 mg Oral BID   [START ON 09/30/2023] multivitamin with minerals  1 tablet Oral Daily   mouth rinse  15 mL Mouth Rinse 4 times per day   sodium chloride flush  3 mL Intravenous Q12H   [START ON 09/30/2023] thiamine  100 mg Oral Daily    Objective: Vital signs in last 24 hours: Patient Vitals for the past 24 hrs:  BP Temp Temp src Pulse Resp SpO2 Weight  09/29/23 1607 (!) 181/95 98.6 F (37 C) Oral (!) 106 18 96 % --  09/29/23 1153 (!) 147/82 -- -- 95 18 98 % --  09/29/23 1151 -- 98 F (36.7 C) -- -- -- -- --  09/29/23 0703 -- -- -- -- -- -- 131.3 kg  09/29/23 0200 (!) 160/82 -- -- 99 16 96 % --  09/29/23 0000 (!) 166/89 98.2 F (36.8 C) Axillary 89 15 96 % --  09/28/23 2000 (!) 137/52 98.2 F (36.8 C) Axillary 90 16 99 % --      PHYSICAL EXAM:  General: awake, lethargic Repsonds to questions  PERL  Lungs: b/l air entry. Heart: Regular rate and rhythm, no murmur, rub or gallop. Abdomen: Soft, non-tender,not distended. Bowel sounds normal. No masses Extremities: b/l leg wounds Skin: No  rashes or lesions. Or bruising Lymph: Cervical, supraclavicular normal. Neurologic: cannot assess  Lab Results    Latest Ref Rng & Units 09/29/2023    5:49 AM 09/28/2023    4:35 AM 09/27/2023    1:51 AM  CBC  WBC 4.0 - 10.5 K/uL 8.9  9.8    9.6  10.0   Hemoglobin 12.0 - 15.0 g/dL 9.6  9.5    9.6  95.6   Hematocrit 36.0 - 46.0 % 31.2  31.0    32.2  34.2   Platelets 150 - 400 K/uL 309  317    317  360        Latest Ref Rng & Units 09/29/2023    5:49 AM 09/28/2023    4:35 AM 09/27/2023    1:51 AM  CMP  Glucose 70 - 99 mg/dL 213  85  086   BUN 8 - 23 mg/dL 51  50  50   Creatinine 0.44 - 1.00 mg/dL 5.78  4.69  6.29   Sodium 135 - 145 mmol/L 144  145  140   Potassium 3.5 - 5.1 mmol/L 4.0  4.4  4.6   Chloride 98 - 111 mmol/L 111  111  106   CO2 22 -  32 mmol/L 23  22  19    Calcium 8.9 - 10.3 mg/dL 8.4  8.2  8.5       Microbiology: [facility]  Studies/Results: CT ABDOMEN PELVIS WO CONTRAST Result Date: 09/27/2023 CLINICAL DATA:  Sepsis and abdominal pain. Suspect small bowel obstruction. EXAM: CT ABDOMEN AND PELVIS WITHOUT CONTRAST TECHNIQUE: Multidetector CT imaging of the abdomen and pelvis was performed following the standard protocol without IV contrast following administration of oral contrast only. RADIATION DOSE REDUCTION: This exam was performed according to the departmental dose-optimization program which includes automated exposure control, adjustment of the mA and/or kV according to patient size and/or use of iterative reconstruction technique. COMPARISON:  CT without contrast 04/02/2022, CT with IV contrast 10/30/2021. FINDINGS: Lower chest: The cardiac size is normal. The mitral ring is heavily calcified. There are three-vessel coronary calcifications. There is no pericardial effusion. There is patchy airspace consolidation throughout the lung fields most likely due to multifocal pneumonia. Trace bilateral pleural effusions. A Dobbhoff feeding tube extends to the left and posteriorly  within the stomach with the tip in the proximal lumen. Hepatobiliary: There is loss of fine detail due to respiratory motion. The liver is steatotic. No mass is seen through motion artifact. There are several rim calcified gallstones largest is 1.4 cm. No wall thickening or biliary dilatation. Pancreas: Partially atrophic. Otherwise unremarkable without contrast. Spleen: No abnormality is seen through the breathing motion. Adrenals/Urinary Tract: There is no adrenal mass. No new contour deforming renal abnormality is seen through the motion artifacts. There are Bosniak 1 cysts in the right kidney, in the upper pole measuring 3.2 cm and 18 Hounsfield units, in the lower pole measuring 1.4 cm and 1.8 Hounsfield units. No follow-up imaging is recommended. The unenhanced kidneys are otherwise unremarkable. There is no urinary stone or obstruction. The bladder is catheterized, contracted and obscured by metal spray artifact from a right hip replacement. Stomach/Bowel: No dilatation or wall thickening. No bowel obstruction or inflammation is seen. Normal subcecal appendix. Scattered sigmoid diverticulosis without evidence of diverticulitis. Vascular/Lymphatic: Aortic atherosclerosis. No enlarged abdominal or pelvic lymph nodes. Reproductive: Uterus and bilateral adnexa are unremarkable. Other: There is evidence of recent injections into the subcutaneous abdominal wall fat. Bilateral stranding noted on the right greater than left in the redundant fatty soft tissues of the left and right abdominal wall, could be due to cellulitis or third spacing of fluid reserves and does not appear to have been present previously. Traumatic etiology could explain this in the appropriate clinical setting. There is trace presacral ascites. No other free fluid. There is no free hemorrhage, free air, or localizing inflammatory process. Musculoskeletal: Right hip arthroplasty. Osteopenia with advanced degenerative change of the spine. Left hip  DJD. Multilevel acquired lumbar spinal stenosis. Multilevel foraminal stenosis. No acute or other significant osseous findings. IMPRESSION: 1. Patchy airspace consolidation throughout the lung fields most likely due to multifocal pneumonia. 2. No bowel obstruction or inflammation. 3. Trace bilateral pleural effusions.  Trace presacral ascites. 4. Dobbhoff feeding tube tip in the proximal stomach. 5. Hepatic steatosis.  Cholelithiasis. 6. Diverticulosis without evidence of diverticulitis. 7. Bilateral stranding in the redundant superficial fatty soft tissues of the left and right abdominal wall, could be due to cellulitis or third spacing of fluid reserves. Traumatic etiology could explain this in the appropriate clinical setting. 8. Aortic and coronary artery atherosclerosis. 9. Osteopenia and advanced degenerative change of the spine. Electronically Signed   By: Almira Bar M.D.   On: 09/27/2023 21:20  Assessment/Plan: 68 y.o. 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 She presents from SNF with altered mental status and not eating and drinking for a few days? ? ?   Acute toxic encephalopathy- due to AKI on CKD and CNS effect of opioids and cefepime causing encephalopathy, myoclonus.   improved This does not look like sepsis clinically esepcially she was  on broad spectrum antibiotics  will hold antibiotics till her mental status clears She may not need Iv antibiotics for the osteo as it is unlikely to heal because of her immobility and pressure wounds MRI Okay   Also has had high BP- PRES will be a concern but MRI without contrast does not show that      AKI on CKD-  started to improve   Paroxysmal afib CT abdomn revealed b/l lung infiltrate- need a dedicated imaging of chest   Obesity   Bed bound status    B/l venous wounds legs With osteo of left leg- so far has received 12 days of IV- holding antibiotics now Dont think antibiotics will help as she has limited mobility and this is more  a pressure issue   Hypothyroidism     Discussed the management with hospitalist  ID will not see her this weekend- on call ID available by phone for urgent issues- call if needed

## 2023-09-30 ENCOUNTER — Encounter: Payer: Self-pay | Admitting: Internal Medicine

## 2023-09-30 DIAGNOSIS — R4182 Altered mental status, unspecified: Secondary | ICD-10-CM | POA: Diagnosis not present

## 2023-09-30 DIAGNOSIS — Z515 Encounter for palliative care: Secondary | ICD-10-CM | POA: Diagnosis not present

## 2023-09-30 DIAGNOSIS — G9341 Metabolic encephalopathy: Secondary | ICD-10-CM | POA: Diagnosis not present

## 2023-09-30 DIAGNOSIS — G9340 Encephalopathy, unspecified: Secondary | ICD-10-CM | POA: Diagnosis not present

## 2023-09-30 DIAGNOSIS — A419 Sepsis, unspecified organism: Secondary | ICD-10-CM | POA: Diagnosis not present

## 2023-09-30 DIAGNOSIS — R6520 Severe sepsis without septic shock: Secondary | ICD-10-CM | POA: Diagnosis not present

## 2023-09-30 LAB — GLUCOSE, CAPILLARY
Glucose-Capillary: 104 mg/dL — ABNORMAL HIGH (ref 70–99)
Glucose-Capillary: 126 mg/dL — ABNORMAL HIGH (ref 70–99)
Glucose-Capillary: 127 mg/dL — ABNORMAL HIGH (ref 70–99)
Glucose-Capillary: 135 mg/dL — ABNORMAL HIGH (ref 70–99)
Glucose-Capillary: 85 mg/dL (ref 70–99)
Glucose-Capillary: 88 mg/dL (ref 70–99)
Glucose-Capillary: 96 mg/dL (ref 70–99)

## 2023-09-30 LAB — CBC
HCT: 35.6 % — ABNORMAL LOW (ref 36.0–46.0)
Hemoglobin: 10.8 g/dL — ABNORMAL LOW (ref 12.0–15.0)
MCH: 27.6 pg (ref 26.0–34.0)
MCHC: 30.3 g/dL (ref 30.0–36.0)
MCV: 90.8 fL (ref 80.0–100.0)
Platelets: 333 10*3/uL (ref 150–400)
RBC: 3.92 MIL/uL (ref 3.87–5.11)
RDW: 18.8 % — ABNORMAL HIGH (ref 11.5–15.5)
WBC: 8 10*3/uL (ref 4.0–10.5)
nRBC: 0 % (ref 0.0–0.2)

## 2023-09-30 LAB — BASIC METABOLIC PANEL WITH GFR
Anion gap: 12 (ref 5–15)
BUN: 47 mg/dL — ABNORMAL HIGH (ref 8–23)
CO2: 21 mmol/L — ABNORMAL LOW (ref 22–32)
Calcium: 8.5 mg/dL — ABNORMAL LOW (ref 8.9–10.3)
Chloride: 113 mmol/L — ABNORMAL HIGH (ref 98–111)
Creatinine, Ser: 3.49 mg/dL — ABNORMAL HIGH (ref 0.44–1.00)
GFR, Estimated: 14 mL/min — ABNORMAL LOW (ref >60)
Glucose, Bld: 100 mg/dL — ABNORMAL HIGH (ref 70–99)
Potassium: 3.6 mmol/L (ref 3.5–5.1)
Sodium: 146 mmol/L — ABNORMAL HIGH (ref 135–145)

## 2023-09-30 MED ORDER — METOPROLOL TARTRATE 5 MG/5ML IV SOLN
10.0000 mg | Freq: Four times a day (QID) | INTRAVENOUS | Status: DC
  Administered 2023-09-30 – 2023-10-02 (×9): 10 mg via INTRAVENOUS
  Filled 2023-09-30 (×10): qty 10

## 2023-09-30 MED ORDER — ENOXAPARIN SODIUM 150 MG/ML IJ SOSY
130.0000 mg | PREFILLED_SYRINGE | INTRAMUSCULAR | Status: DC
  Administered 2023-09-30 – 2023-10-02 (×3): 130 mg via SUBCUTANEOUS
  Filled 2023-09-30 (×3): qty 0.86

## 2023-09-30 MED ORDER — DILTIAZEM HCL 25 MG/5ML IV SOLN: 10.0000 mg | Freq: Four times a day (QID) | INTRAVENOUS | Status: DC | PRN

## 2023-09-30 MED ORDER — SODIUM CHLORIDE 0.45 % IV SOLN: INTRAVENOUS | Status: AC

## 2023-09-30 MED ORDER — ENOXAPARIN SODIUM 120 MG/0.8ML IJ SOSY
120.0000 mg | PREFILLED_SYRINGE | INTRAMUSCULAR | Status: DC
  Filled 2023-09-30: qty 0.8

## 2023-09-30 MED ORDER — HYDROCERIN EX CREA
TOPICAL_CREAM | Freq: Two times a day (BID) | CUTANEOUS | Status: DC
  Administered 2023-09-30: 1 via TOPICAL
  Filled 2023-09-30: qty 113

## 2023-09-30 MED ORDER — HYDROXYZINE HCL 10 MG PO TABS
10.0000 mg | ORAL_TABLET | Freq: Three times a day (TID) | ORAL | Status: DC | PRN
  Administered 2023-09-30: 10 mg via ORAL
  Filled 2023-09-30 (×2): qty 1

## 2023-09-30 NOTE — Plan of Care (Signed)
  Problem: Fluid Volume: Goal: Hemodynamic stability will improve Outcome: Progressing   Problem: Clinical Measurements: Goal: Diagnostic test results will improve Outcome: Progressing Goal: Signs and symptoms of infection will decrease Outcome: Progressing   Problem: Respiratory: Goal: Ability to maintain adequate ventilation will improve Outcome: Progressing   Problem: Education: Goal: Ability to describe self-care measures that may prevent or decrease complications (Diabetes Survival Skills Education) will improve Outcome: Progressing   Problem: Coping: Goal: Ability to adjust to condition or change in health will improve Outcome: Progressing   Problem: Fluid Volume: Goal: Ability to maintain a balanced intake and output will improve Outcome: Progressing   Problem: Metabolic: Goal: Ability to maintain appropriate glucose levels will improve Outcome: Progressing   Problem: Nutritional: Goal: Maintenance of adequate nutrition will improve Outcome: Progressing Goal: Progress toward achieving an optimal weight will improve Outcome: Progressing   Problem: Skin Integrity: Goal: Risk for impaired skin integrity will decrease Outcome: Progressing   Problem: Tissue Perfusion: Goal: Adequacy of tissue perfusion will improve Outcome: Progressing   Problem: Clinical Measurements: Goal: Ability to maintain clinical measurements within normal limits will improve Outcome: Progressing Goal: Will remain free from infection Outcome: Progressing Goal: Diagnostic test results will improve Outcome: Progressing Goal: Cardiovascular complication will be avoided Outcome: Progressing   Problem: Activity: Goal: Risk for activity intolerance will decrease Outcome: Progressing   Problem: Nutrition: Goal: Adequate nutrition will be maintained Outcome: Progressing   Problem: Pain Managment: Goal: General experience of comfort will improve and/or be controlled Outcome:  Progressing   Problem: Safety: Goal: Ability to remain free from injury will improve Outcome: Progressing   Problem: Skin Integrity: Goal: Risk for impaired skin integrity will decrease Outcome: Progressing

## 2023-09-30 NOTE — Progress Notes (Signed)
 [facility]  PROGRESS NOTE   Subjective:   Patient seen at bedside.  Awake and alert but poorly communicative.  Objective:  Vital signs: Blood pressure (!) 145/92, pulse (!) 145, temperature 98.6 F (37 C), temperature source Oral, resp. rate 20, height 5\' 5"  (1.651 m), weight 131.3 kg, SpO2 98%.  Intake/Output Summary (Last 24 hours) at 09/30/2023 1248 Last data filed at 09/30/2023 7829 Gross per 24 hour  Intake 834.97 ml  Output 700 ml  Net 134.97 ml   Filed Weights   09/27/23 0529 09/28/23 0500 09/29/23 0703  Weight: 133.7 kg 131.9 kg 131.3 kg     Physical Exam: General:  No acute distress  Head:  Normocephalic, atraumatic. Moist oral mucosal membranes  Eyes:  Anicteric  Neck:  Supple  Lungs:   Clear to auscultation, normal effort  Heart:  S1S2 no rubs  Abdomen:   Soft, nontender, bowel sounds present  Extremities:  peripheral edema.  Neurologic:  Awake, alert, following commands  Skin:  No lesions  Access:     Basic Metabolic Panel: Recent Labs  Lab 09/25/23 1351 09/25/23 2328 09/26/23 0125 09/27/23 0151 09/28/23 0435 09/29/23 0549 09/30/23 0452  NA 140   < > 141 140 145 144 146*  K 4.9   < > 4.4 4.6 4.4 4.0 3.6  CL 107   < > 110 106 111 111 113*  CO2 17*   < > 19* 19* 22 23 21*  GLUCOSE 264*   < > 170* 171* 85 197* 100*  BUN 45*   < > 45* 50* 50* 51* 47*  CREATININE 3.23*   < > 3.33* 3.81* 4.02* 3.89* 3.49*  CALCIUM 8.7*   < > 8.4* 8.5* 8.2* 8.4* 8.5*  MG 2.0  --  2.0 2.2 2.1  --   --   PHOS 5.9*  --  5.6* 5.7* 5.7*  --   --    < > = values in this interval not displayed.   GFR: Estimated Creatinine Clearance: 20.5 mL/min (A) (by C-G formula based on SCr of 3.49 mg/dL (H)).  Liver Function Tests: Recent Labs  Lab 09/24/23 1214 09/25/23 0602 09/25/23 2328  AST 23 17 19   ALT 14 12 11   ALKPHOS 76 69 62  BILITOT 0.6 0.6 0.7  PROT 7.4 7.1 6.6  ALBUMIN 2.5* 2.2* 2.2*   No results for input(s): "LIPASE", "AMYLASE" in the last 168  hours. Recent Labs  Lab 09/27/23 0859  AMMONIA 16    CBC: Recent Labs  Lab 09/24/23 1214 09/25/23 0602 09/25/23 2328 09/26/23 0125 09/27/23 0151 09/28/23 0435 09/29/23 0549 09/30/23 0452  WBC 9.8 17.6* 12.6* 11.9* 10.0 9.6  9.8 8.9 8.0  NEUTROABS 6.3 16.1* 10.3*  --   --  7.4  --   --   HGB 10.9* 10.1* 9.3* 9.8* 10.3* 9.6*  9.5* 9.6* 10.8*  HCT 36.1 33.4* 30.5* 31.6* 34.2* 32.2*  31.0* 31.2* 35.6*  MCV 90.3 92.0 90.2 89.3 88.8 92.8  89.3 90.2 90.8  PLT 415* 398 361 364 360 317  317 309 333     HbA1C: Hgb A1c MFr Bld  Date/Time Value Ref Range Status  09/06/2023 05:36 AM 8.1 (H) 4.8 - 5.6 % Final    Comment:    (NOTE) Pre diabetes:          5.7%-6.4%  Diabetes:              >6.4%  Glycemic control for   <7.0% adults with diabetes  10/31/2021 04:36 AM 7.9 (H) 4.8 - 5.6 % Final    Comment:    (NOTE) Pre diabetes:          5.7%-6.4%  Diabetes:              >6.4%  Glycemic control for   <7.0% adults with diabetes     Urinalysis: No results for input(s): "COLORURINE", "LABSPEC", "PHURINE", "GLUCOSEU", "HGBUR", "BILIRUBINUR", "KETONESUR", "PROTEINUR", "UROBILINOGEN", "NITRITE", "LEUKOCYTESUR" in the last 72 hours.  Invalid input(s): "APPERANCEUR"    Imaging: No results found.   Medications:    sodium chloride 50 mL/hr at 09/30/23 1032    acetaminophen (TYLENOL) oral liquid 160 mg/5 mL  650 mg Oral TID   Chlorhexidine Gluconate Cloth  6 each Topical Daily   enoxaparin (LOVENOX) injection  130 mg Subcutaneous Q24H   insulin aspart  0-15 Units Subcutaneous TID WC   insulin glargine-yfgn  15 Units Subcutaneous Daily   levothyroxine  100 mcg Oral Q0600   metoprolol tartrate  10 mg Intravenous Q6H   multivitamin with minerals  1 tablet Oral Daily   mouth rinse  15 mL Mouth Rinse 4 times per day   sodium chloride flush  3 mL Intravenous Q12H   thiamine  100 mg Oral Daily    Assessment/ Plan:     68 y.o. female with past medical history of PAD,  type 2 diabetes, HTN, CKD, right fifth toe amputation, s/p THA with hypothyroidism, Bell's palsy (2015), and recent hospitalization at [facility] for osteomyelitis (3/10 - 3/21, discharged with PICC in place for IV antibiotic treatment) admitted on 09/24/2023 with altered mental status with no history of dementia and acute kidney injury  #1: Acute kidney injury on chronic kidney disease: Patient presently has acute kidney injury on chronic kidney disease.  The acute kidney injury is most likely secondary to ATN due to sepsis.  Creatinine slightly improved from yesterday.  Will continue to monitor renal indices closely.  #2: Sepsis: Patient was evaluated by surgery.  She has been on antibiotics for possible osteomyelitis which she completed at this time.  #3: Anemia of chronic kidney disease: Will continue anemia protocols.  #4: Diabetes: Continue insulin as per protocol.  Overall prognosis is guarded. Labs and medications reviewed. Will continue to follow along with you.   LOS: 6 Lorain Childes, MD [facility] 4/5/202512:48 PM

## 2023-09-30 NOTE — Progress Notes (Signed)
 [facility] Progress Note  Patient: [name]    UEA:[id number]  DOA: 09/24/2023     Date of Service: the patient was seen and examined on 09/30/2023  Chief Complaint  Patient presents with   Altered Mental Status   Brief hospital course:  [name] is a 68 y.o. female with medical history significant of chronic bilateral lower extremity wounds with recent diagnosis of left fibula osteomyelitis, HFrEF with last EF of 45%, chronic lymphedema, hypertension, type 2 diabetes, PAD, CKD stage IIIa, who presents to the ED due to altered mental status.   History obtained through chart review due to patient's altered mental status. Per facility staff at [facility], patient has become increasingly altered over the past 1 to 2 days and has been refusing to take her medications.  At baseline, she is alert and oriented x 4 with no underlying history of dementia.  No reports of fever.   Per chart review, patient was recently admitted on 09/04/2023 and was discovered to have a left fibula osteomyelitis.  Cultures grew Staph aureus and Providencia.  She was discharged on cefepime and daptomycin with plans to continue IV antibiotics through October 19, 2023.   ED course: On arrival to the ED, patient was normotensive at 117/104 with heart rate of 90.  She was saturating at 100% on room air.  She was afebrile at 98.3.  Initial workup notable for hemoglobin of 10.9, platelets 415, bicarb 21, BUN 38, creatinine 2.75, and GFR of 18.  Lactic acid 2.7.  Urinalysis with ketonuria, proteinuria, few bacteria.  CT head with no acute intracranial abnormalities.  Chest x-ray with low lung volumes.  Patient started on Rocephin, vancomycin, IV fluids and pain medication.  [facility] contacted for admission.   Assessment and Plan:  # Toxic metabolic encephalopathy possible due to cefepime VS opiates Vs renal failure.  Patient presented with altered mental status ABG reviewed, no CO2 retention. Avoid  sedating medications and avoid opiates PCCM consulted, patient received 1 dose of Narcan with some improvement in the mental status Rule out DKA, encephalopathy could be due to cefepime toxicity ID consulted, recommended to stop daptomycin and cefepime, patient already treated for 12 weeks.  Leg wounds are chronic.  4/1 Narcan 1 dose given, patient started moaning but still no significant improvement in her mental status.   Insert NG tube, consulted nutritionist to start enteral feeding Continue IV fluid for hydration CT head was negative on admission,  4/2 MRI brain negative for any acute findings 4/2 EEG: Moderate diffuse encephalopathy, no seizures were noted.  Toxic metabolic encephalopathy likely cefepime toxicity, renal dysfunction and hyperammonia. Ammonia level within normal range. 4/4 patient pulled out NG tube, SLP eval done, started on dysphagia 1 diet, with nectar thick liquids.  Mental status is improving   Paroxysmal A-fib with RVR developed on 4/1 in the morning S/p Cardizem 10 mg IV one-time dose given S/p Lopressor 5 mg IV x 2 doses given 4/2 Increased Lopressor IV 10 mg q6h Monitor BP and heart rate, titrate medications accordingly. 4/2 TTE shows LVEF 60-65%, no WMA mild LV hypertrophy, grade 1 diastolic dysfunction.  No significant valvular abnormality, negative PFO 4/5 patient is refusing oral meds so discontinued oral Lopressor, started Lopressor 10 mg IV Q6 hourly and Cardizem 10 mg IV every 6 hourly as needed 4/5 Started Lovenox therapeutic dose for stroke prophylaxis Patient may need DOAC on discharge if persistent A-fib.   # Sepsis secondary to left fibular osteomyelitis MRI  LLE reviewed Patient was discharged on cefepime and daptomycin via PICC line to be continued through 10/19/2023 as per ID recommendation Seen by orthopedic surgery, recommended no surgical intervention Continue wound care Stoppod antibiotics as per ID  # AKI on CKD stage IIIb Baseline  creatinine range 1.1-1.6 Creatinine 2.75 on admission Continue IV fluid for hydration Monitor urine output, avoid nephrotoxic medications 3/31 Foley catheter insertion to monitor intake and output 4/5 creatinine 3.59 slightly improved Continue IV fluid for hydration Follow nephrology for further recommendation  # Hyponatremia, sodium 147 on 4/5 Changed IV fluid to 1/2 NS Check BMP daily  # Hyperkalemia due to renal failure.  Hyperkalemia resolved Lokelma order placed per patient is unable to take oral due to AMS S/p D10, insulin 5 units and albuterol nebulizer Repeat BMP Continue to monitor on telemetry   # Metabolic acidosis due to renal failure S/p Bicarbonate 150 mEq IV given on 3/31 S/p bicarbonate 50 mEq x 1 dose given on 4/1 4/2 bicarb 50 mEq one-time IV push given Monitor electrolytes   # IDDM T2  Continue NovoLog sliding scale Monitor CBG Start diabetic diet when patient is able to swallow Held gabapentin due to altered mental status   # Chronic systolic CHF and hypertension, PAD Held home medications amlodipine 5 mg, Lasix 20 mg BID, Toprol-XL 12.5 mg, irbesartan 150 mg, clonidine 0.3 mg p.o. 3 times daily, Plavix 75 mg, Held home meds due to altered mental status and sepsis Monitor BP and resume medications as per improvement 4/4 Lopressor 50 mg p.o. twice daily, patient was on Lopressor 10 mg IV every 6 hourly when she was NPO.  switch to oral meds. 4/5 refused oral Lopressor, changed back to IV Lopressor.  # Hypothyroid, TSH 0.1 very low and free T40.67 At lower and Patient was on Synthroid 200 mcg p.o. daily, decreased dose to 100 mcg p.o. daily when she will be able to swallow.  # Hyperphosphatemia due to renal failure, monitor electrolytes daily  # Chronic pain, patient is slightly more awake on 4/3 and she is crying and moaning seems in discomfort. Started Dilaudid 0.5 mg IV every 4 hourly as needed. Tylenol 650 mg 3 times daily via NG tube  Due to  AMS and multiple comorbidities, patient's condition was poor so palliative care was consulted for goals of care discussion. PCCM was consulted on 3/31 Patient has no immediate family, she has a close friend, she is making decisions for her.   Body mass index is 48.17 kg/m.  Interventions:  Pressure Injury 04/17/21 Sacrum Medial Stage 2 -  Partial thickness loss of dermis presenting as a shallow open injury with a red, pink wound bed without slough. (Active)  04/17/21 0444  Location: Sacrum  Location Orientation: Medial  Staging: Stage 2 -  Partial thickness loss of dermis presenting as a shallow open injury with a red, pink wound bed without slough.  Wound Description (Comments):   Present on Admission: Yes     Pressure Injury 04/17/21 Heel Left;Posterior Unstageable - Full thickness tissue loss in which the base of the injury is covered by slough (yellow, tan, gray, green or brown) and/or eschar (tan, brown or black) in the wound bed. (Active)  04/17/21   Location: Heel  Location Orientation: Left;Posterior  Staging: Unstageable - Full thickness tissue loss in which the base of the injury is covered by slough (yellow, tan, gray, green or brown) and/or eschar (tan, brown or black) in the wound bed.  Wound Description (Comments):  Present on Admission: Yes     Diet: Carb modified diet when able to swallow DVT Prophylaxis: Subcutaneous Heparin    Advance goals of care discussion: DNR/Limited code  CODE STATUS discussed with patient's close friend on fortunately, she agreed to keep her DNR DNI.  Patient does not have any immediate family member.  Family Communication: family was not present at bedside, at the time of interview.  The pt provided permission to discuss medical plan with the family. Opportunity was given to ask question and all questions were answered satisfactorily.  4/3 management plan discussed with patient's friend at bedside.  Disposition:  Pt is from SNF,  admitted with AMS due to cefepime toxicity, AMS improved currently x 3 but still has renal failure which precludes a safe discharge. Discharge to SNF, when renal function improves and cleared by nephrology.     Subjective: No significant events overnight, patient is AAO x 3, still little confused, mental status improved a lot.  Patient is able to follow command. Trying to wiggle her toes and move legs but unable to bend her knees. Patient is chronically bedbound.  Patient was lying comfortably, did not offer any complaints.   Physical Exam: General: Not in acute distress, AAO x 3, mild confusion, mental status has improved a lot Eyes: PERRLA  ENT: Oral Mucosa Clear, but dry Neck: no JVD,  Cardiovascular: S1 and S2 Present, no Murmur,  Respiratory: Equal air entry bilaterally, No significant crackles or wheezes Abdomen: Bowel Sound present, Soft and no tenderness,  Skin: Bilateral chronic leg wounds, wrapped with dressing Extremities: 4+ Pedal edema, no calf tenderness Neurologic: AAO x 3, mental status improved, following commands.  Chronically bedbound unable to move her lower extremities.  No focal deficit.   Vitals:   09/30/23 0428 09/30/23 0853 09/30/23 1206 09/30/23 1400  BP: (!) 163/83 (!) 158/73 (!) 145/92 135/80  Pulse: 92 87 (!) 145 (!) 106  Resp:  16 20 18   Temp: 98 F (36.7 C) 98.1 F (36.7 C) 98.6 F (37 C) 98.2 F (36.8 C)  TempSrc: Oral Oral Oral   SpO2: 97% 98% 98% 100%  Weight:      Height:        Intake/Output Summary (Last 24 hours) at 09/30/2023 1531 Last data filed at 09/30/2023 1610 Gross per 24 hour  Intake 784.97 ml  Output 700 ml  Net 84.97 ml   Filed Weights   09/27/23 0529 09/28/23 0500 09/29/23 0703  Weight: 133.7 kg 131.9 kg 131.3 kg    Data Reviewed: I have personally reviewed and interpreted daily labs, tele strips, imagings as discussed above. I reviewed all nursing notes, pharmacy notes, vitals, pertinent old records I have discussed  plan of care as described above with RN and patient/family.  CBC: Recent Labs  Lab 09/24/23 1214 09/25/23 0602 09/25/23 2328 09/26/23 0125 09/27/23 0151 09/28/23 0435 09/29/23 0549 09/30/23 0452  WBC 9.8 17.6* 12.6* 11.9* 10.0 9.6  9.8 8.9 8.0  NEUTROABS 6.3 16.1* 10.3*  --   --  7.4  --   --   HGB 10.9* 10.1* 9.3* 9.8* 10.3* 9.6*  9.5* 9.6* 10.8*  HCT 36.1 33.4* 30.5* 31.6* 34.2* 32.2*  31.0* 31.2* 35.6*  MCV 90.3 92.0 90.2 89.3 88.8 92.8  89.3 90.2 90.8  PLT 415* 398 361 364 360 317  317 309 333   Basic Metabolic Panel: Recent Labs  Lab 09/25/23 1351 09/25/23 2328 09/26/23 0125 09/27/23 0151 09/28/23 0435 09/29/23 0549 09/30/23 0452  NA 140   < >  141 140 145 144 146*  K 4.9   < > 4.4 4.6 4.4 4.0 3.6  CL 107   < > 110 106 111 111 113*  CO2 17*   < > 19* 19* 22 23 21*  GLUCOSE 264*   < > 170* 171* 85 197* 100*  BUN 45*   < > 45* 50* 50* 51* 47*  CREATININE 3.23*   < > 3.33* 3.81* 4.02* 3.89* 3.49*  CALCIUM 8.7*   < > 8.4* 8.5* 8.2* 8.4* 8.5*  MG 2.0  --  2.0 2.2 2.1  --   --   PHOS 5.9*  --  5.6* 5.7* 5.7*  --   --    < > = values in this interval not displayed.    Studies: No results found.   Scheduled Meds:  acetaminophen (TYLENOL) oral liquid 160 mg/5 mL  650 mg Oral TID   Chlorhexidine Gluconate Cloth  6 each Topical Daily   enoxaparin (LOVENOX) injection  130 mg Subcutaneous Q24H   insulin aspart  0-15 Units Subcutaneous TID WC   insulin glargine-yfgn  15 Units Subcutaneous Daily   levothyroxine  100 mcg Oral Q0600   metoprolol tartrate  10 mg Intravenous Q6H   multivitamin with minerals  1 tablet Oral Daily   mouth rinse  15 mL Mouth Rinse 4 times per day   sodium chloride flush  3 mL Intravenous Q12H   thiamine  100 mg Oral Daily   Continuous Infusions:  sodium chloride 50 mL/hr at 09/30/23 1032     PRN Meds: diltiazem, HYDROmorphone (DILAUDID) injection, iohexol, naLOXone (NARCAN)  injection, ondansetron (ZOFRAN) IV, mouth rinse  Time  spent: 55 minutes  Author: Gillis Santa. MD [facility] 09/30/2023 3:31 PM  To reach On-call, see care teams to locate the attending and reach out to them via [email or web address]. If 7PM-7AM, please contact night-coverage If you still have difficulty reaching the attending provider, please page the [facility] (Director on Call) for [facility] on amion for assistance.

## 2023-09-30 NOTE — Progress Notes (Signed)
   09/30/23 1206  Assess: MEWS Score  Temp 98.6 F (37 C)  BP (!) 145/92  MAP (mmHg) 107  Pulse Rate (!) 145  ECG Heart Rate (!) 145  Resp 20  SpO2 98 %  Assess: MEWS Score  MEWS Temp 0  MEWS Systolic 0  MEWS Pulse 3  MEWS RR 0  MEWS LOC 0  MEWS Score 3  MEWS Score Color Yellow  Assess: if the MEWS score is Yellow or Red  Were vital signs accurate and taken at a resting state? Yes  Does the patient meet 2 or more of the SIRS criteria? No  MEWS guidelines implemented  Yes, yellow  Treat  MEWS Interventions Considered administering scheduled or prn medications/treatments as ordered  Take Vital Signs  Increase Vital Sign Frequency  Yellow: Q2hr x1, continue Q4hrs until patient remains green for 12hrs  Escalate  MEWS: Escalate Yellow: Discuss with charge nurse and consider notifying provider and/or RRT  Notify: Charge Nurse/RN  Name of Charge Nurse/RN Notified Preweena, RN  Provider Notification  Provider Name/Title Dr. Gillis Santa  Date Provider Notified 09/30/23  Time Provider Notified 1210  Method of Notification Page  Provider response See new orders (Will give PRNs as needed.)  Date of Provider Response 09/30/23  Time of Provider Response 1212  Notify: Rapid Response  Name of Rapid Response RN Notified  (No rapid called)  Assess: SIRS CRITERIA  SIRS Temperature  0  SIRS Respirations  0  SIRS Pulse 1  SIRS WBC 0  SIRS Score Sum  1

## 2023-09-30 NOTE — Progress Notes (Signed)
 Daily Progress Note   Patient Name: [name]       Date: 09/30/2023 DOB: 06-10-1956  Age: 68 y.o. MRN#: [id number] Attending Physician: Gillis Santa, MD Primary Care Physician: Pcp, No Admit Date: 09/24/2023  Reason for Consultation/Follow-up: Establishing goals of care  HPI/Brief Hospital Review: 68 y.o. female  with past medical history of PAD, type 2 diabetes, HTN, CKD, right fifth toe amputation, s/p THA with hypothyroidism, Bell's palsy (2015), and recent hospitalization at [facility] for osteomyelitis (3/10 - 3/21, discharged with PICC in place for IV antibiotic treatment) admitted on 09/24/2023 with altered mental status with no history of dementia.   Patient is being treated for AKI.  ID was consulted.  Suspicion of cefepime toxicity.  Cefepime stopped.  All other sedatives held as well.   Orthopedic surgery was consulted and recommended no surgical intervention.  Continue with wound care.   PMT was consulted to support patient and goals of care discussions.  Subjective: Extensive chart review has been completed prior to meeting patient including labs, vital signs, imaging, progress notes, orders, and available advanced directive documents from current and previous encounters.    Visited with [name] at her bedside, she is awake and alert, breakfast tray remains at bedside untouched, she shares she is not interested in eating at this time. No visitors at bedside.  [name] is unable to completed ROS-reports not being in acute pain.  Orientation assessed, she correctly states year, refuses to accept it if April, aware she is in [place] but unable to state location (hospital), she cannot appropriately recall days of week and unaware of current medical situation.  [name] is  able to share [name] is a long term friend. When asked about family, [name] shares she does not have family, inquired about mention of cousins-she states she "might have some cousins" but explains she has never spoke with them. Inquired about surrogate decision maker being [name] at this time-[name] did not respond to this question.  PMT will continue to follow assessing [name]'s capacity to engage in GOC discussions or appoint surrogate decision maker.  Objective:  Physical Exam Constitutional:      General: She is not in acute distress.    Appearance: She is ill-appearing.  Pulmonary:     Effort: Pulmonary effort is normal. No  respiratory distress.  Skin:    General: Skin is warm and dry.  Neurological:     Mental Status: She is alert. She is disoriented.     Motor: Weakness present.             Vital Signs: BP (!) 145/92 (BP Location: Left Wrist)   Pulse (!) 145   Temp 98.6 F (37 C) (Oral)   Resp 20   Ht 5\' 5"  (1.651 m)   Wt 131.3 kg   SpO2 98%   BMI 48.17 kg/m  SpO2: SpO2: 98 % O2 Device: O2 Device: Room Air O2 Flow Rate: O2 Flow Rate (L/min): 2 L/min   Palliative Care Assessment & Plan   Assessment/Recommendation/Plan  Time for outcomes, ongoing capacity assessment  Thank you for allowing the Palliative Medicine Team to assist in the care of this patient.  Total time:  35 minutes  Time spent includes: Detailed review of medical records (labs, imaging, vital signs), medically appropriate exam (mental status, respiratory, cardiac, skin), discussed with treatment team, counseling and educating patient, family and staff, documenting clinical information, medication management and coordination of care.  Leeanne Deed, DNP, AGNP-C Palliative Medicine   Please contact Palliative Medicine Team phone at [phone] for questions and concerns.

## 2023-09-30 NOTE — Consult Note (Signed)
 PHARMACY - ANTICOAGULATION CONSULT NOTE  Pharmacy Consult for Enoxparin Indication: atrial fibrillation  No Known Allergies  Patient Measurements: Height: 5\' 5"  (165.1 cm) Weight: 131.3 kg (289 lb 7.4 oz) IBW/kg (Calculated) : 57 HEPARIN DW (KG): 89.8  Vital Signs: Temp: 98.6 F (37 C) (04/05 1206) Temp Source: Oral (04/05 1206) BP: 145/92 (04/05 1206) Pulse Rate: 145 (04/05 1206)  Labs: Recent Labs    09/28/23 0435 09/28/23 0533 09/29/23 0549 09/30/23 0452  HGB 9.6*  9.5*  --  9.6* 10.8*  HCT 32.2*  31.0*  --  31.2* 35.6*  PLT 317  317  --  309 333  CREATININE 4.02*  --  3.89* 3.49*  CKTOTAL  --  53  --   --     Estimated Creatinine Clearance: 20.5 mL/min (A) (by C-G formula based on SCr of 3.49 mg/dL (H)).   Medical History: Past Medical History:  Diagnosis Date   Acute congestive heart failure (HCC) 09/26/2023   Diabetes mellitus without complication (HCC) 2010   Hypertension    Hypothyroidism    Kidney stone 11/2014   history of/STAGE 4 KIDNEY DISEASE PER DR Wynelle Link   Lymphedema    Multiple open wounds of lower leg    PT BEING SEEN BY THE [facility] FOR CHRONIC BLISTERS PER PT    Medications:  Received Heparin 5,000 units at 0600 this AM  Assessment: [name] is a 68 yo female who presented to [facility] with AMS. During the course of their stay they develop paroxysmal a-fib with RVR on 4/1. They current have an AKI with current Scr 3.49 with a baseline around 1.4. Currently resolving slowing but CrCl still < 30 mL/min. Pharmacy has been consulted to dose this patient's Lovenox.  Current Labs Plt 333 Hgb 10.8  Plan:  Consulted for exonaparin dosing for afib Due to AKI and CrCl < 30 dosing patient at 1 mg/kg daily  Monitor for renal improvement and/or transition to PO  Monitor CBC while on enoxparin Consider getting levels due to patient's CrCl  Effie Shy, PharmD Pharmacy Resident  09/30/2023 12:59 PM

## 2023-10-01 DIAGNOSIS — R4182 Altered mental status, unspecified: Secondary | ICD-10-CM | POA: Diagnosis not present

## 2023-10-01 DIAGNOSIS — G9340 Encephalopathy, unspecified: Secondary | ICD-10-CM | POA: Diagnosis not present

## 2023-10-01 DIAGNOSIS — Z515 Encounter for palliative care: Secondary | ICD-10-CM | POA: Diagnosis not present

## 2023-10-01 DIAGNOSIS — N179 Acute kidney failure, unspecified: Secondary | ICD-10-CM | POA: Diagnosis not present

## 2023-10-01 DIAGNOSIS — A419 Sepsis, unspecified organism: Secondary | ICD-10-CM | POA: Diagnosis not present

## 2023-10-01 LAB — BASIC METABOLIC PANEL WITH GFR
Anion gap: 10 (ref 5–15)
BUN: 45 mg/dL — ABNORMAL HIGH (ref 8–23)
CO2: 22 mmol/L (ref 22–32)
Calcium: 8.2 mg/dL — ABNORMAL LOW (ref 8.9–10.3)
Chloride: 113 mmol/L — ABNORMAL HIGH (ref 98–111)
Creatinine, Ser: 3.39 mg/dL — ABNORMAL HIGH (ref 0.44–1.00)
GFR, Estimated: 14 mL/min — ABNORMAL LOW (ref >60)
Glucose, Bld: 89 mg/dL (ref 70–99)
Potassium: 3.3 mmol/L — ABNORMAL LOW (ref 3.5–5.1)
Sodium: 145 mmol/L (ref 135–145)

## 2023-10-01 LAB — CBC
HCT: 35.1 % — ABNORMAL LOW (ref 36.0–46.0)
Hemoglobin: 10.7 g/dL — ABNORMAL LOW (ref 12.0–15.0)
MCH: 27.7 pg (ref 26.0–34.0)
MCHC: 30.5 g/dL (ref 30.0–36.0)
MCV: 90.9 fL (ref 80.0–100.0)
Platelets: 292 10*3/uL (ref 150–400)
RBC: 3.86 MIL/uL — ABNORMAL LOW (ref 3.87–5.11)
RDW: 18.7 % — ABNORMAL HIGH (ref 11.5–15.5)
WBC: 7.1 10*3/uL (ref 4.0–10.5)
nRBC: 0 % (ref 0.0–0.2)

## 2023-10-01 LAB — GLUCOSE, CAPILLARY
Glucose-Capillary: 91 mg/dL (ref 70–99)
Glucose-Capillary: 92 mg/dL (ref 70–99)
Glucose-Capillary: 83 mg/dL (ref 70–99)
Glucose-Capillary: 97 mg/dL (ref 70–99)
Glucose-Capillary: 106 mg/dL — ABNORMAL HIGH (ref 70–99)

## 2023-10-01 MED ORDER — BISMUTH SUBSALICYLATE 262 MG/15ML PO SUSP
30.0000 mL | Freq: Once | ORAL | Status: AC
  Administered 2023-10-01: 30 mL via ORAL
  Filled 2023-10-01: qty 118

## 2023-10-01 MED ORDER — POTASSIUM CHLORIDE 20 MEQ PO PACK
40.0000 meq | PACK | Freq: Once | ORAL | Status: DC
  Filled 2023-10-01: qty 2

## 2023-10-01 MED ORDER — BENZONATATE 100 MG PO CAPS
200.0000 mg | ORAL_CAPSULE | Freq: Three times a day (TID) | ORAL | Status: DC | PRN
Start: 2023-10-01 — End: 2023-10-02
  Administered 2023-10-01 – 2023-10-02 (×2): 200 mg via ORAL
  Filled 2023-10-01 (×2): qty 2

## 2023-10-01 MED ORDER — POTASSIUM CHLORIDE 10 MEQ/100ML IV SOLN
10.0000 meq | INTRAVENOUS | Status: AC
Start: 2023-10-01 — End: 2023-10-01
  Administered 2023-10-01 (×4): 10 meq via INTRAVENOUS
  Filled 2023-10-01 (×4): qty 100

## 2023-10-01 NOTE — Progress Notes (Addendum)
 PICC line has no blood return. Per Lab tech patient refused for morning blood extraction.   Patient refused for wound dressing, she wants to do it later.

## 2023-10-01 NOTE — Progress Notes (Signed)
 PROGRESS NOTE    [name]  ZOX:[id number] DOB: [birth year] DOA: 09/24/2023 PCP: Pcp, No   Assessment & Plan:   Principal Problem:   Sepsis (HCC) Active Problems:   Osteomyelitis of left fibula (HCC)   Acute encephalopathy   Acute kidney injury superimposed on CKD (HCC)   Type II diabetes mellitus with renal manifestations (HCC)   Chronic systolic CHF (congestive heart failure) (HCC)   PVD (peripheral vascular disease) (HCC)   Hypothyroidism   Essential hypertension   Acute congestive heart failure (HCC)   Altered mental status  Assessment and Plan: Acute encephalopathy: etiology unclear, drug induced from narcotics vs cefepime vs delirium vs dementia. MRI brain shows no acute intracranial abnormalities. EEG showed no seizures.    PAF: continue on metoprolol. Cardizem prn    Sepsis: secondary to left fibular osteomyelitis. See Dr. Remus Blake note on how pt met sepsis criteria. Completed abx course already as per ID. No surgical intervention as per ortho surg    AKI on CKDIIIb: Cr is labile. Avoid nephrotoxic meds    Hyponatremia: WNL today  Hyperkalemia: resolved   Metabolic acidosis: resolved   DM2: likely poorly controlled. Continue on glargine, SSI w/ accuchecks    Chronic systolic CHF: continue on metoprolol. Monitor I/Os  HTN: continue on metoprolol.  Hx of PAD: not currently on a statin or aspirin   Hypothyroidism: continue on levothyroxine. Will need to get thyroid levels outpatient    Hyperphosphatemia: secondary to AKI on CKD.    Chronic pain: tylenol and dilaudid prn for pain       DVT prophylaxis: lovenox Code Status: DNR Family Communication:  Disposition Plan: likely d/c back home or previous home facility   Level of care: Progressive  Status is: Inpatient Remains inpatient appropriate because: severity of illness    Consultants:    Procedures:   Antimicrobials:   Subjective: Pt appears agitated. Pt denies pain    Objective: Vitals:   09/30/23 1947 09/30/23 2259 10/01/23 0442 10/01/23 0800  BP: 124/81 (!) 150/83 (!) 149/72   Pulse: 90 91 88 84  Resp: 17   16  Temp: 98.1 F (36.7 C) 98.3 F (36.8 C) 98.5 F (36.9 C) 98.4 F (36.9 C)  TempSrc: Oral Oral Oral Oral  SpO2: 97% 98% 100%   Weight:      Height:        Intake/Output Summary (Last 24 hours) at 10/01/2023 0835 Last data filed at 10/01/2023 0622 Gross per 24 hour  Intake 1653.97 ml  Output 750 ml  Net 903.97 ml   Filed Weights   09/27/23 0529 09/28/23 0500 09/29/23 0703  Weight: 133.7 kg 131.9 kg 131.3 kg    Examination:  General exam: Appears agitated & frustrated. Morbidly obese Respiratory system: decreased breath sounds b/l  Cardiovascular system: S1 & S2+. No rubs, gallops or clicks. Gastrointestinal system: Abdomen is obese, soft and nontender.  Normal bowel sounds heard. Central nervous system: lethargic.  Psychiatry: Judgement and insight appears poor.     Data Reviewed: I have personally reviewed following labs and imaging studies  CBC: Recent Labs  Lab 09/24/23 1214 09/25/23 0602 09/25/23 2328 09/26/23 0125 09/27/23 0151 09/28/23 0435 09/29/23 0549 09/30/23 0452 10/01/23 0715  WBC 9.8 17.6* 12.6*   < > 10.0 9.6  9.8 8.9 8.0 7.1  NEUTROABS 6.3 16.1* 10.3*  --   --  7.4  --   --   --   HGB 10.9* 10.1* 9.3*   < > 10.3* 9.6*  9.5* 9.6* 10.8* 10.7*  HCT 36.1 33.4* 30.5*   < > 34.2* 32.2*  31.0* 31.2* 35.6* 35.1*  MCV 90.3 92.0 90.2   < > 88.8 92.8  89.3 90.2 90.8 90.9  PLT 415* 398 361   < > 360 317  317 309 333 292   < > = values in this interval not displayed.   Basic Metabolic Panel: Recent Labs  Lab 09/25/23 1351 09/25/23 2328 09/26/23 0125 09/27/23 0151 09/28/23 0435 09/29/23 0549 09/30/23 0452 10/01/23 0715  NA 140   < > 141 140 145 144 146* 145  K 4.9   < > 4.4 4.6 4.4 4.0 3.6 3.3*  CL 107   < > 110 106 111 111 113* 113*  CO2 17*   < > 19* 19* 22 23 21* 22  GLUCOSE 264*   < >  170* 171* 85 197* 100* 89  BUN 45*   < > 45* 50* 50* 51* 47* 45*  CREATININE 3.23*   < > 3.33* 3.81* 4.02* 3.89* 3.49* 3.39*  CALCIUM 8.7*   < > 8.4* 8.5* 8.2* 8.4* 8.5* 8.2*  MG 2.0  --  2.0 2.2 2.1  --   --   --   PHOS 5.9*  --  5.6* 5.7* 5.7*  --   --   --    < > = values in this interval not displayed.   GFR: Estimated Creatinine Clearance: 21.1 mL/min (A) (by C-G formula based on SCr of 3.39 mg/dL (H)). Liver Function Tests: Recent Labs  Lab 09/24/23 1214 09/25/23 0602 09/25/23 2328  AST 23 17 19   ALT 14 12 11   ALKPHOS 76 69 62  BILITOT 0.6 0.6 0.7  PROT 7.4 7.1 6.6  ALBUMIN 2.5* 2.2* 2.2*   No results for input(s): "LIPASE", "AMYLASE" in the last 168 hours. Recent Labs  Lab 09/27/23 0859  AMMONIA 16   Coagulation Profile: Recent Labs  Lab 09/24/23 1214 09/25/23 2328  INR 1.4* 1.4*   Cardiac Enzymes: Recent Labs  Lab 09/25/23 0602 09/28/23 0533  CKTOTAL 36* 53   BNP (last 3 results) No results for input(s): "PROBNP" in the last 8760 hours. HbA1C: No results for input(s): "HGBA1C" in the last 72 hours. CBG: Recent Labs  Lab 09/30/23 1607 09/30/23 1948 09/30/23 2325 10/01/23 0439 10/01/23 0756  GLUCAP 85 126* 135* 91 92   Lipid Profile: No results for input(s): "CHOL", "HDL", "LDLCALC", "TRIG", "CHOLHDL", "LDLDIRECT" in the last 72 hours. Thyroid Function Tests: No results for input(s): "TSH", "T4TOTAL", "FREET4", "T3FREE", "THYROIDAB" in the last 72 hours. Anemia Panel: No results for input(s): "VITAMINB12", "FOLATE", "FERRITIN", "TIBC", "IRON", "RETICCTPCT" in the last 72 hours. Sepsis Labs: Recent Labs  Lab 09/25/23 1351 09/25/23 1618 09/25/23 2328 09/26/23 0125  PROCALCITON  --   --   --  0.97  LATICACIDVEN 3.6* 3.4* 1.3 1.2    Recent Results (from the past 240 hours)  Resp panel by RT-PCR (RSV, Flu A&B, Covid) Anterior Nasal Swab     Status: None   Collection Time: 09/24/23 12:21 PM   Specimen: Anterior Nasal Swab  Result Value Ref  Range Status   SARS Coronavirus 2 by RT PCR NEGATIVE NEGATIVE Final    Comment: (NOTE) SARS-CoV-2 target nucleic acids are NOT DETECTED.  The SARS-CoV-2 RNA is generally detectable in upper respiratory specimens during the acute phase of infection. The lowest concentration of SARS-CoV-2 viral copies this assay can detect is 138 copies/mL. A negative result does not preclude SARS-Cov-2 infection  and should not be used as the sole basis for treatment or other patient management decisions. A negative result may occur with  improper specimen collection/handling, submission of specimen other than nasopharyngeal swab, presence of viral mutation(s) within the areas targeted by this assay, and inadequate number of viral copies(<138 copies/mL). A negative result must be combined with clinical observations, patient history, and epidemiological information. The expected result is Negative.  Fact Sheet for Patients:  BloggerCourse.com  Fact Sheet for Healthcare Providers:  SeriousBroker.it  This test is no t yet approved or cleared by the [place] FDA and  has been authorized for detection and/or diagnosis of SARS-CoV-2 by FDA under an Emergency Use Authorization (EUA). This EUA will remain  in effect (meaning this test can be used) for the duration of the COVID-19 declaration under Section 564(b)(1) of the Act, 21 U.S.C.section 360bbb-3(b)(1), unless the authorization is terminated  or revoked sooner.       Influenza A by PCR NEGATIVE NEGATIVE Final   Influenza B by PCR NEGATIVE NEGATIVE Final    Comment: (NOTE) The Xpert Xpress SARS-CoV-2/FLU/RSV plus assay is intended as an aid in the diagnosis of influenza from Nasopharyngeal swab specimens and should not be used as a sole basis for treatment. Nasal washings and aspirates are unacceptable for Xpert Xpress SARS-CoV-2/FLU/RSV testing.  Fact Sheet for  Patients: BloggerCourse.com  Fact Sheet for Healthcare Providers: SeriousBroker.it  This test is not yet approved or cleared by the [place] FDA and has been authorized for detection and/or diagnosis of SARS-CoV-2 by FDA under an Emergency Use Authorization (EUA). This EUA will remain in effect (meaning this test can be used) for the duration of the COVID-19 declaration under Section 564(b)(1) of the Act, 21 U.S.C. section 360bbb-3(b)(1), unless the authorization is terminated or revoked.     Resp Syncytial Virus by PCR NEGATIVE NEGATIVE Final    Comment: (NOTE) Fact Sheet for Patients: BloggerCourse.com  Fact Sheet for Healthcare Providers: SeriousBroker.it  This test is not yet approved or cleared by the [place] FDA and has been authorized for detection and/or diagnosis of SARS-CoV-2 by FDA under an Emergency Use Authorization (EUA). This EUA will remain in effect (meaning this test can be used) for the duration of the COVID-19 declaration under Section 564(b)(1) of the Act, 21 U.S.C. section 360bbb-3(b)(1), unless the authorization is terminated or revoked.  Performed at [facility], [address], [place], [place] [address]   Urine Culture     Status: None   Collection Time: 09/24/23 12:21 PM   Specimen: Urine, Random  Result Value Ref Range Status   Specimen Description   Final    URINE, RANDOM Performed at [facility], [address], [place], [place] [address]    Special Requests   Final    NONE Reflexed from [id number] Performed at [facility], [address], [place], [place] [address]    Culture   Final    NO GROWTH Performed at [facility], [address] [place], [place] [address]    Report Status 09/25/2023 FINAL  Final  Blood Culture (routine x 2)     Status: None   Collection Time: 09/24/23 12:22 PM    Specimen: BLOOD LEFT FOREARM  Result Value Ref Range Status   Specimen Description BLOOD LEFT FOREARM  Final   Special Requests   Final    BOTTLES DRAWN AEROBIC ONLY Blood Culture results may not be optimal due to an inadequate volume of blood  received in culture bottles   Culture   Final    NO GROWTH 5 DAYS Performed at [facility], [address] [place], [place] [address]    Report Status 09/29/2023 FINAL  Final  Blood Culture (routine x 2)     Status: None   Collection Time: 09/24/23 12:23 PM   Specimen: Left Antecubital; Blood  Result Value Ref Range Status   Specimen Description LEFT ANTECUBITAL  Final   Special Requests   Final    BOTTLES DRAWN AEROBIC AND ANAEROBIC Blood Culture adequate volume   Culture   Final    NO GROWTH 5 DAYS Performed at [facility], [address] [place], [place] [address]    Report Status 09/29/2023 FINAL  Final  MRSA Next Gen by PCR, Nasal     Status: None   Collection Time: 09/25/23  8:44 PM   Specimen: Nasal Mucosa; Nasal Swab  Result Value Ref Range Status   MRSA by PCR Next Gen NOT DETECTED NOT DETECTED Final    Comment: (NOTE) The GeneXpert MRSA Assay (FDA approved for NASAL specimens only), is one component of a comprehensive MRSA colonization surveillance program. It is not intended to diagnose MRSA infection nor to guide or monitor treatment for MRSA infections. Test performance is not FDA approved in patients less than 59 years old. Performed at [facility], [address] [place], [place] [address]          Radiology Studies: No results found.      Scheduled Meds:  acetaminophen (TYLENOL) oral liquid 160 mg/5 mL  650 mg Oral TID   Chlorhexidine Gluconate Cloth  6 each Topical Daily   enoxaparin (LOVENOX) injection  130 mg Subcutaneous Q24H   hydrocerin   Topical BID   insulin aspart  0-15 Units Subcutaneous TID WC   insulin glargine-yfgn  15 Units Subcutaneous Daily    levothyroxine  100 mcg Oral Q0600   metoprolol tartrate  10 mg Intravenous Q6H   multivitamin with minerals  1 tablet Oral Daily   mouth rinse  15 mL Mouth Rinse 4 times per day   sodium chloride flush  3 mL Intravenous Q12H   thiamine  100 mg Oral Daily   Continuous Infusions:  sodium chloride 50 mL/hr at 09/30/23 1700     LOS: 7 days        Charise Killian, MD [facility] Pager [phone]  If 7PM-7AM, please contact night-coverage [email or web address] 10/01/2023, 8:35 AM

## 2023-10-01 NOTE — Progress Notes (Addendum)
 Daily Progress Note   Patient Name: [name]       Date: 10/01/2023 DOB: 12-05-1955  Age: 68 y.o. MRN#: [id number] Attending Physician: Charise Killian, MD Primary Care Physician: Aviva Kluver Admit Date: 09/24/2023  Reason for Consultation/Follow-up: Establishing goals of care  HPI/Brief Hospital Review: 68 y.o. female  with past medical history of PAD, type 2 diabetes, HTN, CKD, right fifth toe amputation, s/p THA with hypothyroidism, Bell's palsy (2015), and recent hospitalization at [facility] for osteomyelitis (3/10 - 3/21, discharged with PICC in place for IV antibiotic treatment) admitted on 09/24/2023 with altered mental status with no history of dementia.   Patient is being treated for AKI.  ID was consulted.  Suspicion of cefepime toxicity.  Cefepime stopped.  All other sedatives held as well.   Orthopedic surgery was consulted and recommended no surgical intervention.  Continue with wound care.   PMT was consulted to support patient and goals of care discussions.  Subjective: Extensive chart review has been completed prior to meeting patient including labs, vital signs, imaging, progress notes, orders, and available advanced directive documents from current and previous encounters.    Visited with Ms. Reisen at her bedside. She is awake and lying in bed with eyes open, on my arrival to her bedside she closed her eyes and did not engage during my visit.  Called and spoke with friend-Tricia, she continues to express her concern regarding decision maker. She attempted to speak with Ms. Blumenstein again yesterday about her cousins/family, Ms. Warsame unable to provide correct information. [name] shares Ms. Knowles has likely not spoken with her cousins in over 20-30 years and does not feel they  would be interested in stepping in as Management consultant. [name] is hopeful Ms. Raneri will continue to improve and can clearly define who she wishes to appoint as surrogate decision maker. [name] shares at [facility], Ms. Hocutt had herself listed as emergency contact.  Symptom burden remains low. PMT will continue to follow for ongoing needs and support. I will be off service but PMT provider previously following will check in for needs on Tuesday.  Thank you for allowing the Palliative Medicine Team to assist in the care of this patient.  Total time:  35 minutes  Time spent includes: Detailed review of medical records (labs, imaging, vital signs), medically  appropriate exam (mental status, respiratory, cardiac, skin), discussed with treatment team, counseling and educating patient, family and staff, documenting clinical information, medication management and coordination of care.  Leeanne Deed, DNP, AGNP-C Palliative Medicine   Please contact Palliative Medicine Team phone at [phone] for questions and concerns.

## 2023-10-01 NOTE — Evaluation (Addendum)
 Occupational Therapy Evaluation Patient Details Name: [name] MRN: [id number] DOB: 04/13/1956 Today's Date: 10/01/2023   History of Present Illness   68 y.o. female  with past medical history of PAD, type 2 diabetes, HTN, CKD, right fifth toe amputation, s/p THA with hypothyroidism, Bell's palsy (2015), and recent hospitalization at [facility] for osteomyelitis (3/10 - 3/21, discharged with PICC in place for IV antibiotic treatment) admitted on 09/24/2023 with altered mental status with no history of dementia.     Patient is being treated for AKI.   ID was consulted.  Suspicion of cefepime toxicity.     Clinical Impressions Chart reviewed, nurse in room reporting pt not feeding herself; Pt appears oriented to her self ?orientation x3 but also not engaging with therapist at times. Inconsistent one step direction following but ?volitional vs AMS.Brief evaluation completed after discussion with nurse. This therapist spoke with [facility] staff who said pt often remains in the bed but requires a hoyer lift for transfer to mwc. She is typically oriented x4 and can feed herself.  She requires assist for all dressing/bathing tasks at bed level. Pt cognition appears altered compared to baseline. On this date pt feeds herself after utensils loaded and frequent vcs for sustained attention to task. OT will follow to optimize ADL performance but anticipate improvements with improvements in cognition. Pt is left in care of nurse and friend, all needs met.      If plan is discharge home, recommend the following:   A lot of help with walking and/or transfers;A lot of help with bathing/dressing/bathroom     Functional Status Assessment   Patient has had a recent decline in their functional status and demonstrates the ability to make significant improvements in function in a reasonable and predictable amount of time.     Equipment Recommendations   Other (comment) (defer)     Recommendations for Other  Services         Precautions/Restrictions   Precautions Precautions: Fall Recall of Precautions/Restrictions: Impaired     Mobility Bed Mobility               General bed mobility comments: MAX A at baseline- pt declined on this date; does not sit edge of bed at facility    Transfers                   General transfer comment: hoyer at baseline      Balance                                           ADL either performed or assessed with clinical judgement   ADL Overall ADL's : Needs assistance/impaired Eating/Feeding: Minimal assistance Eating/Feeding Details (indicate cue type and reason): set up for loading utensils, per staff/friend pt has not eaten much today, was a feeder Grooming: Minimal assistance                                 General ADL Comments: MAX-TOTAL A at baseline for dressing/bathing tasks;     Vision         Perception         Praxis         Pertinent Vitals/Pain Pain Assessment Pain Assessment: No/denies pain     Extremity/Trunk Assessment Upper Extremity Assessment Upper Extremity Assessment:  Generalized weakness   Lower Extremity Assessment Lower Extremity Assessment: Generalized weakness       Communication Communication Communication: No apparent difficulties   Cognition Arousal: Alert Behavior During Therapy: Flat affect Cognition: Cognition impaired     Awareness: Intellectual awareness impaired, Online awareness impaired   Attention impairment (select first level of impairment): Sustained attention   OT - Cognition Comments: will continue to assess; Pt admitted with AMS, is typically alert and oriented x4, pt generally not engaging with therapist unless friend encourages her to; when asked if she is [name] she says "no";                 Following commands: Impaired Following commands impaired: Follows one step commands inconsistently (? AMS vs  volitional)     Cueing  General Comments   Cueing Techniques: Verbal cues      Exercises Other Exercises Other Exercises: edu re: role of OT, role of rehab   Shoulder Instructions      Home Living Family/patient expects to be discharged to:: Skilled nursing facility                                 Additional Comments: Pt lives at [facility], called [facility] to confirm PLOF, they report she requires hoyer lift for out of bed (but has been refusing), assist for all dressing/bathing at bed level, will feed herself with set up and talk on the phone; Nurse reports pt is typically alert and oriented x4      Prior Functioning/Environment Prior Level of Function : Needs assist                    OT Problem List: Decreased activity tolerance;Decreased cognition   OT Treatment/Interventions: Self-care/ADL training;Therapeutic activities;Therapeutic exercise      OT Goals(Current goals can be found in the care plan section)   Acute Rehab OT Goals Patient Stated Goal: eat salt OT Goal Formulation: With patient Time For Goal Achievement: 10/15/23 Potential to Achieve Goals: Good   OT Frequency:  Min 1X/week    Co-evaluation              AM-PAC OT "6 Clicks" Daily Activity     Outcome Measure Help from another person eating meals?: A Little Help from another person taking care of personal grooming?: A Lot Help from another person toileting, which includes using toliet, bedpan, or urinal?: A Lot Help from another person bathing (including washing, rinsing, drying)?: A Lot Help from another person to put on and taking off regular upper body clothing?: A Lot Help from another person to put on and taking off regular lower body clothing?: A Lot 6 Click Score: 13   End of Session Nurse Communication: Mobility status  Activity Tolerance: Patient tolerated treatment well Patient left: in bed;with call bell/phone within reach;with bed alarm set;with nursing/sitter in  room;with family/visitor present  OT Visit Diagnosis: Muscle weakness (generalized) (M62.81)                Time: 8469-6295 OT Time Calculation (min): 8 min Charges:  OT General Charges $OT Visit: 1 Visit OT Evaluation $OT Eval Low Complexity: 1 Low  Oleta Mouse, OTD OTR/L  10/01/23, 2:56 PM

## 2023-10-01 NOTE — Consult Note (Signed)
 PHARMACY - ANTICOAGULATION CONSULT NOTE  Pharmacy Consult for Enoxaparin Indication: atrial fibrillation  No Known Allergies  Patient Measurements: Height: 5\' 5"  (165.1 cm) Weight: 131.3 kg (289 lb 7.4 oz) IBW/kg (Calculated) : 57 HEPARIN DW (KG): 89.8  Vital Signs: Temp: 98.4 F (36.9 C) (04/06 0800) Temp Source: Oral (04/06 0800) BP: 170/71 (04/06 0800) Pulse Rate: 84 (04/06 0800)  Labs: Recent Labs    09/29/23 0549 09/30/23 0452 10/01/23 0715  HGB 9.6* 10.8* 10.7*  HCT 31.2* 35.6* 35.1*  PLT 309 333 292  CREATININE 3.89* 3.49* 3.39*    Estimated Creatinine Clearance: 21.1 mL/min (A) (by C-G formula based on SCr of 3.39 mg/dL (H)).   Medical History: Past Medical History:  Diagnosis Date   Acute congestive heart failure (HCC) 09/26/2023   Diabetes mellitus without complication (HCC) 2010   Hypertension    Hypothyroidism    Kidney stone 11/2014   history of/STAGE 4 KIDNEY DISEASE PER DR Wynelle Link   Lymphedema    Multiple open wounds of lower leg    PT BEING SEEN BY THE [facility] FOR CHRONIC BLISTERS PER PT    Medications:  Medications Prior to Admission  Medication Sig Dispense Refill Last Dose/Taking   acetaminophen (TYLENOL) 325 MG tablet Take 2 tablets (650 mg total) by mouth every 6 (six) hours as needed for mild pain (or Fever >/= 101).   Taking As Needed   amLODipine (NORVASC) 5 MG tablet Take 5 mg by mouth daily.   09/24/2023 at  8:00 AM   ascorbic acid (VITAMIN C) 500 MG tablet Take 1 tablet (500 mg total) by mouth daily. 30 tablet 0 09/24/2023 at  8:00 AM   ceFEPime (MAXIPIME) IVPB Inject 2 g into the vein every 12 (twelve) hours. Indication:  L leg (fibula) osteomyelitis Last Day of Therapy:  10/19/2023 Labs - Once weekly:  CBC/D, CMP, CPK, ESR and CRP Fax weekly lab results  promptly to [phone]  Method of administration: IV Push Method of administration may be changed at the discretion of facility and its pharmacy Please pull PIC at  completion of IV antibiotics Call [phone] with critical values or questions 72 Units 0 09/24/2023 at  8:00 AM   cloNIDine (CATAPRES) 0.3 MG tablet TAKE ONE TABLET BY MOUTH 3 TIMES DAILY. 270 tablet 1 09/24/2023 at 12:00 PM   clopidogrel (PLAVIX) 75 MG tablet Take 1 tablet (75 mg total) by mouth daily with breakfast. 30 tablet 0 09/24/2023 at  6:30 AM   daptomycin (CUBICIN) IVPB Inject 700 mg into the vein daily. Indication:  L leg (fibula) osteomyelitis Last Day of Therapy:  10/19/2023 Labs - Once weekly:  CBC/D, CMP, CPK, ESR and CRP Fax weekly lab results  promptly to [phone]  Method of administration: IV Push Method of administration may be changed at the discretion of facility and its pharmacy Please pull PIC at completion of IV antibiotics Call [phone] with critical values or questions 36 Units 0 09/23/2023   diclofenac Sodium (VOLTAREN) 1 % GEL Apply 2 g topically 3 (three) times daily.   09/24/2023 at  2:00 PM   ferrous gluconate (FERGON) 324 MG tablet Take 324 mg by mouth daily with breakfast.   09/24/2023 at  8:00 AM   furosemide (LASIX) 20 MG tablet Take 1 tablet (20 mg total) by mouth 2 (two) times daily. 60 tablet 0 09/24/2023 at  8:00 AM   gabapentin (NEURONTIN) 300 MG capsule Take 1 capsule (300 mg total) by mouth at bedtime.  09/23/2023   HYDROmorphone (DILAUDID) 2 MG tablet Take 0.5 tablets (1 mg total) by mouth every 4 (four) hours as needed for moderate pain (pain score 4-6). 30 tablet 0 09/24/2023 at  8:20 AM   HYDROmorphone (DILAUDID) 2 MG tablet Take 1 tablet (2 mg total) by mouth every 12 (twelve) hours as needed (with dressing changes). 30 tablet 0 Taking As Needed   hydrOXYzine (ATARAX) 10 MG tablet Take 10 mg by mouth every 8 (eight) hours as needed for itching.   Taking As Needed   insulin aspart (NOVOLOG) 100 UNIT/ML injection Inject 3 Units into the skin 3 (three) times daily with meals. 10 mL 0 09/24/2023 at 12:00 PM   irbesartan (AVAPRO) 150 MG tablet Take 1  tablet (150 mg total) by mouth daily. 30 tablet 0 09/24/2023 at  8:00 AM   LANTUS SOLOSTAR 100 UNIT/ML Solostar Pen Inject 14 Units into the skin every 12 (twelve) hours.   09/24/2023 at  8:00 AM   levothyroxine (SYNTHROID) 200 MCG tablet Take 200 mcg by mouth daily before breakfast.   09/24/2023 at  6:00 AM   metoprolol succinate (TOPROL-XL) 25 MG 24 hr tablet Take 0.5 tablets (12.5 mg total) by mouth daily with breakfast. 15 tablet 0 09/24/2023 at  8:00 AM   Multiple Vitamin (MULTIVITAMIN WITH MINERALS) TABS tablet Take 1 tablet by mouth daily. 30 tablet 0 09/24/2023   NYSTATIN powder Apply 1 application. topically 3 (three) times daily.   09/24/2023 at 12:00 PM   ondansetron (ZOFRAN-ODT) 4 MG disintegrating tablet Take 2 mg by mouth every 6 (six) hours as needed.   Taking As Needed   senna-docusate (SENOKOT-S) 8.6-50 MG tablet Take 2 tablets by mouth at bedtime.   09/23/2023   zinc oxide 20 % ointment Apply 1 Application topically as needed for diaper changes.   09/24/2023   blood glucose meter kit and supplies 1 each by Other route daily. ONE TOUCH ULTRA METER. Use as directed. E11.29 1 each 0    camphor-menthol (SARNA) lotion Apply topically as needed for itching. (Patient not taking: Reported on 09/24/2023)   Not Taking   gentamicin ointment (GARAMYCIN) 0.1 % Apply 1 Application topically daily. (Patient not taking: Reported on 09/24/2023)   Not Taking   glucose blood test strip 1 each by Other route daily. ONE TOUCH ULTRA TEST STRIPS E11.29 100 strip 3    Lancets (ONETOUCH ULTRASOFT) lancets 1 each by Other route daily. Use as instructed E11.29 100 each 3    Nutritional Supplements (,FEEDING SUPPLEMENT, PROSOURCE PLUS) liquid Take 30 mLs by mouth 3 (three) times daily between meals. (Patient not taking: Reported on 09/24/2023) 2700 mL 0 Not Taking   Nystatin (GERHARDT'S BUTT CREAM) CREA Apply 1 Application topically daily. (Patient not taking: Reported on 09/24/2023)   Not Taking   Vitamin D,  Ergocalciferol, (DRISDOL) 1.25 MG (50000 UNIT) CAPS capsule Take 50,000 Units by mouth every 7 (seven) days. (Patient not taking: Reported on 09/24/2023)   Not Taking   Scheduled:   acetaminophen (TYLENOL) oral liquid 160 mg/5 mL  650 mg Oral TID   Chlorhexidine Gluconate Cloth  6 each Topical Daily   enoxaparin (LOVENOX) injection  130 mg Subcutaneous Q24H   hydrocerin   Topical BID   insulin aspart  0-15 Units Subcutaneous TID WC   insulin glargine-yfgn  15 Units Subcutaneous Daily   levothyroxine  100 mcg Oral Q0600   metoprolol tartrate  10 mg Intravenous Q6H   multivitamin with minerals  1 tablet  Oral Daily   mouth rinse  15 mL Mouth Rinse 4 times per day   sodium chloride flush  3 mL Intravenous Q12H   thiamine  100 mg Oral Daily   Infusions:  PRN: diltiazem, HYDROmorphone (DILAUDID) injection, hydrOXYzine, iohexol, naLOXone (NARCAN)  injection, ondansetron (ZOFRAN) IV, mouth rinse Anti-infectives (From admission, onward)    Start     Dose/Rate Route Frequency Ordered Stop   09/26/23 1400  DAPTOmycin (CUBICIN) IVPB 700 mg/123mL premix  Status:  Discontinued        700 mg 200 mL/hr over 30 Minutes Intravenous Every 48 hours 09/24/23 1517 09/25/23 1352   09/25/23 1400  DAPTOmycin (CUBICIN) IVPB 700 mg/129mL premix  Status:  Discontinued        700 mg 200 mL/hr over 30 Minutes Intravenous Daily 09/24/23 1510 09/24/23 1517   09/25/23 0800  ceFEPIme (MAXIPIME) 2 g in sodium chloride 0.9 % 100 mL IVPB  Status:  Discontinued        2 g 200 mL/hr over 30 Minutes Intravenous Every 24 hours 09/24/23 1510 09/25/23 1352   09/24/23 1215  cefTRIAXone (ROCEPHIN) 2 g in sodium chloride 0.9 % 100 mL IVPB        2 g 200 mL/hr over 30 Minutes Intravenous Once 09/24/23 1214 09/24/23 1316   09/24/23 1215  vancomycin (VANCOCIN) IVPB 1000 mg/200 mL premix        1,000 mg 200 mL/hr over 60 Minutes Intravenous  Once 09/24/23 1214 09/24/23 1421       Assessment: Pharmacy consulted to start  enoxaparin for afib. Patient's Scr is improving and seems pt is making urine. Due to the unstable renal function will need to monitor levels. No DOAC PTA.   Goal of Therapy:  Anti-Xa level 0.6-1 units/ml 4hrs after LMWH dose given Monitor platelets by anticoagulation protocol: Yes   Plan:  Lovenox 1mg /kg q24H. Anti-xa level for 4/7.   Ronnald Ramp, PharmD, BCPS 10/01/2023,10:19 AM

## 2023-10-01 NOTE — Plan of Care (Signed)
  Problem: Fluid Volume: Goal: Hemodynamic stability will improve Outcome: Progressing   Problem: Clinical Measurements: Goal: Diagnostic test results will improve Outcome: Progressing Goal: Signs and symptoms of infection will decrease Outcome: Progressing   Problem: Education: Goal: Ability to describe self-care measures that may prevent or decrease complications (Diabetes Survival Skills Education) will improve Outcome: Progressing   Problem: Coping: Goal: Ability to adjust to condition or change in health will improve Outcome: Progressing   Problem: Fluid Volume: Goal: Ability to maintain a balanced intake and output will improve Outcome: Progressing   Problem: Metabolic: Goal: Ability to maintain appropriate glucose levels will improve Outcome: Progressing   Problem: Nutritional: Goal: Maintenance of adequate nutrition will improve Outcome: Progressing Goal: Progress toward achieving an optimal weight will improve Outcome: Progressing   Problem: Tissue Perfusion: Goal: Adequacy of tissue perfusion will improve Outcome: Progressing   Problem: Clinical Measurements: Goal: Ability to maintain clinical measurements within normal limits will improve Outcome: Progressing Goal: Will remain free from infection Outcome: Progressing Goal: Diagnostic test results will improve Outcome: Progressing   Problem: Activity: Goal: Risk for activity intolerance will decrease Outcome: Progressing   Problem: Nutrition: Goal: Adequate nutrition will be maintained Outcome: Progressing   Problem: Coping: Goal: Level of anxiety will decrease Outcome: Progressing

## 2023-10-01 NOTE — Plan of Care (Signed)

## 2023-10-01 NOTE — Progress Notes (Signed)
 PT Cancellation Note  Patient Details Name: [name] MRN: [id number] DOB: 11-10-1955   Cancelled Treatment:    Reason Eval/Treat Not Completed: PT screened, no needs identified, will sign off (Per chart, pt is from LTC, has been bedbound >2 years. No acute PT needs at this time, will sign off.)   Massiah Minjares C 10/01/2023, 9:05 AM

## 2023-10-01 NOTE — Progress Notes (Signed)
 Speech Language Pathology Treatment: Dysphagia  Patient Details Name: [name] MRN: [id number] DOB: 1956/05/10 Today's Date: 10/01/2023 Time: 1210-1218 SLP Time Calculation (min) (ACUTE ONLY): 8 min  Assessment / Plan / Recommendation Clinical Impression  Pt seen for ongoing dysphagia management and trials of advanced liquids. Pt was awake but despite maximal attempts to engage with pt, she closed her eyes and didn't engage with this Clinical research associate. ST will continue to follow for possible diet advancement. Continue dysphagia 1 diet with nectar thick liquids, medicine crushed in puree.    HPI HPI: 68 y.o. 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  She presents from SNF with altered mental status and not eating and drinking for a few days on 09/24/2023. Pt had NG placed d/t inability to consume PO d/t AMS. ID was consulted and suspected cefepime toxicity (currently held). MRI on 09/27/2023 revealed No acute intracranial abnormality.  Mild for age signal changes compatible with chronic small vessel disease. CT Abdomen on 09/27/2023 revealed Patchy airspace consolidation throughout the lung fields most likely due to multifocal pneumonia. 2. No bowel obstruction or inflammation. 3. Trace bilateral pleural effusions.  Trace presacral ascites.      SLP Plan  Continue with current plan of care      Recommendations for follow up therapy are one component of a multi-disciplinary discharge planning process, led by the attending physician.  Recommendations may be updated based on patient status, additional functional criteria and insurance authorization.    Recommendations  Diet  recommendations: Dysphagia 1 (puree);Nectar-thick liquid Liquids provided via: Cup Medication Administration: Crushed with puree Supervision: Staff to assist with self feeding;Full supervision/cueing for compensatory strategies Compensations: Minimize environmental distractions;Slow rate;Small sips/bites Postural Changes and/or Swallow Maneuvers: Out of bed for meals;Seated upright 90 degrees                  Oral care QID   Frequent or constant Supervision/Assistance Dysphagia, oropharyngeal phase (R13.12);Cognitive communication deficit (R41.841)     Continue with current plan of care     Telvin Reinders  10/01/2023, 12:26 PM

## 2023-10-01 NOTE — Progress Notes (Signed)
 [facility]  PROGRESS NOTE   Subjective:   Patient seen at bedside.  More awake today.  Objective:  Vital signs: Blood pressure (!) 165/63, pulse 99, temperature 98.7 F (37.1 C), temperature source Axillary, resp. rate 20, height 5\' 5"  (1.651 m), weight 131.3 kg, SpO2 100%.  Intake/Output Summary (Last 24 hours) at 10/01/2023 1227 Last data filed at 10/01/2023 0622 Gross per 24 hour  Intake 1653.97 ml  Output 750 ml  Net 903.97 ml   Filed Weights   09/27/23 0529 09/28/23 0500 09/29/23 0703  Weight: 133.7 kg 131.9 kg 131.3 kg     Physical Exam: General:  No acute distress  Head:  Normocephalic, atraumatic. Moist oral mucosal membranes  Eyes:  Anicteric  Neck:  Supple  Lungs:   Clear to auscultation, normal effort  Heart:  S1S2 no rubs  Abdomen:   Soft, nontender, bowel sounds present  Extremities:  peripheral edema.  Neurologic:  Awake, alert, following commands  Skin:  No lesions  Access:     Basic Metabolic Panel: Recent Labs  Lab 09/25/23 1351 09/25/23 2328 09/26/23 0125 09/27/23 0151 09/28/23 0435 09/29/23 0549 09/30/23 0452 10/01/23 0715  NA 140   < > 141 140 145 144 146* 145  K 4.9   < > 4.4 4.6 4.4 4.0 3.6 3.3*  CL 107   < > 110 106 111 111 113* 113*  CO2 17*   < > 19* 19* 22 23 21* 22  GLUCOSE 264*   < > 170* 171* 85 197* 100* 89  BUN 45*   < > 45* 50* 50* 51* 47* 45*  CREATININE 3.23*   < > 3.33* 3.81* 4.02* 3.89* 3.49* 3.39*  CALCIUM 8.7*   < > 8.4* 8.5* 8.2* 8.4* 8.5* 8.2*  MG 2.0  --  2.0 2.2 2.1  --   --   --   PHOS 5.9*  --  5.6* 5.7* 5.7*  --   --   --    < > = values in this interval not displayed.   GFR: Estimated Creatinine Clearance: 21.1 mL/min (A) (by C-G formula based on SCr of 3.39 mg/dL (H)).  Liver Function Tests: Recent Labs  Lab 09/25/23 0602 09/25/23 2328  AST 17 19  ALT 12 11  ALKPHOS 69 62  BILITOT 0.6 0.7  PROT 7.1 6.6  ALBUMIN 2.2* 2.2*   No results for input(s): "LIPASE", "AMYLASE" in the last 168  hours. Recent Labs  Lab 09/27/23 0859  AMMONIA 16    CBC: Recent Labs  Lab 09/25/23 0602 09/25/23 2328 09/26/23 0125 09/27/23 0151 09/28/23 0435 09/29/23 0549 09/30/23 0452 10/01/23 0715  WBC 17.6* 12.6*   < > 10.0 9.6  9.8 8.9 8.0 7.1  NEUTROABS 16.1* 10.3*  --   --  7.4  --   --   --   HGB 10.1* 9.3*   < > 10.3* 9.6*  9.5* 9.6* 10.8* 10.7*  HCT 33.4* 30.5*   < > 34.2* 32.2*  31.0* 31.2* 35.6* 35.1*  MCV 92.0 90.2   < > 88.8 92.8  89.3 90.2 90.8 90.9  PLT 398 361   < > 360 317  317 309 333 292   < > = values in this interval not displayed.     HbA1C: Hgb A1c MFr Bld  Date/Time Value Ref Range Status  09/06/2023 05:36 AM 8.1 (H) 4.8 - 5.6 % Final    Comment:    (NOTE) Pre diabetes:  5.7%-6.4%  Diabetes:              >6.4%  Glycemic control for   <7.0% adults with diabetes   10/31/2021 04:36 AM 7.9 (H) 4.8 - 5.6 % Final    Comment:    (NOTE) Pre diabetes:          5.7%-6.4%  Diabetes:              >6.4%  Glycemic control for   <7.0% adults with diabetes     Urinalysis: No results for input(s): "COLORURINE", "LABSPEC", "PHURINE", "GLUCOSEU", "HGBUR", "BILIRUBINUR", "KETONESUR", "PROTEINUR", "UROBILINOGEN", "NITRITE", "LEUKOCYTESUR" in the last 72 hours.  Invalid input(s): "APPERANCEUR"    Imaging: No results found.   Medications:     acetaminophen (TYLENOL) oral liquid 160 mg/5 mL  650 mg Oral TID   Chlorhexidine Gluconate Cloth  6 each Topical Daily   enoxaparin (LOVENOX) injection  130 mg Subcutaneous Q24H   hydrocerin   Topical BID   insulin aspart  0-15 Units Subcutaneous TID WC   insulin glargine-yfgn  15 Units Subcutaneous Daily   levothyroxine  100 mcg Oral Q0600   metoprolol tartrate  10 mg Intravenous Q6H   multivitamin with minerals  1 tablet Oral Daily   mouth rinse  15 mL Mouth Rinse 4 times per day   sodium chloride flush  3 mL Intravenous Q12H   thiamine  100 mg Oral Daily    Assessment/ Plan:     68 y.o.  female with past medical history of PAD, type 2 diabetes, HTN, CKD, right fifth toe amputation, s/p THA with hypothyroidism, Bell's palsy (2015), and recent hospitalization at [facility] for osteomyelitis (3/10 - 3/21, discharged with PICC in place for IV antibiotic treatment) admitted on 09/24/2023 with altered mental status with no history of dementia and acute kidney injury   #1: Acute kidney injury on chronic kidney disease: Patient presently has acute kidney injury on chronic kidney disease.  The acute kidney injury is most likely secondary to ATN due to sepsis.  Creatinine slightly improved from yesterday.  Will continue to monitor renal indices closely.   #2: Sepsis: Patient was evaluated by surgery.  She has been on antibiotics for possible osteomyelitis which she completed at this time.   #3: Anemia of chronic kidney disease: Will continue anemia protocols.   #4: Diabetes: Continue insulin as per protocol.  #5: Hypokalemia: Supplement potassium.   Overall prognosis is guarded.  Labs and medications reviewed. Will continue to follow along with you.   LOS: 7 Lorain Childes, MD [facility] 4/6/202512:27 PM

## 2023-10-02 ENCOUNTER — Other Ambulatory Visit (HOSPITAL_COMMUNITY): Payer: Self-pay

## 2023-10-02 ENCOUNTER — Telehealth (HOSPITAL_COMMUNITY): Payer: Self-pay | Admitting: Pharmacy Technician

## 2023-10-02 DIAGNOSIS — N179 Acute kidney failure, unspecified: Secondary | ICD-10-CM | POA: Diagnosis not present

## 2023-10-02 DIAGNOSIS — M86162 Other acute osteomyelitis, left tibia and fibula: Secondary | ICD-10-CM | POA: Diagnosis not present

## 2023-10-02 DIAGNOSIS — L97929 Non-pressure chronic ulcer of unspecified part of left lower leg with unspecified severity: Secondary | ICD-10-CM

## 2023-10-02 DIAGNOSIS — G9340 Encephalopathy, unspecified: Secondary | ICD-10-CM | POA: Diagnosis not present

## 2023-10-02 DIAGNOSIS — N189 Chronic kidney disease, unspecified: Secondary | ICD-10-CM | POA: Diagnosis not present

## 2023-10-02 DIAGNOSIS — I83019 Varicose veins of right lower extremity with ulcer of unspecified site: Secondary | ICD-10-CM

## 2023-10-02 DIAGNOSIS — I83029 Varicose veins of left lower extremity with ulcer of unspecified site: Secondary | ICD-10-CM

## 2023-10-02 LAB — BASIC METABOLIC PANEL WITH GFR
Anion gap: 10 (ref 5–15)
BUN: 41 mg/dL — ABNORMAL HIGH (ref 8–23)
CO2: 22 mmol/L (ref 22–32)
Calcium: 8.1 mg/dL — ABNORMAL LOW (ref 8.9–10.3)
Chloride: 113 mmol/L — ABNORMAL HIGH (ref 98–111)
Creatinine, Ser: 3.07 mg/dL — ABNORMAL HIGH (ref 0.44–1.00)
GFR, Estimated: 16 mL/min — ABNORMAL LOW (ref >60)
Glucose, Bld: 73 mg/dL (ref 70–99)
Potassium: 3.5 mmol/L (ref 3.5–5.1)
Sodium: 145 mmol/L (ref 135–145)

## 2023-10-02 LAB — GLUCOSE, CAPILLARY
Glucose-Capillary: 69 mg/dL — ABNORMAL LOW (ref 70–99)
Glucose-Capillary: 70 mg/dL (ref 70–99)
Glucose-Capillary: 74 mg/dL (ref 70–99)
Glucose-Capillary: 90 mg/dL (ref 70–99)
Glucose-Capillary: 94 mg/dL (ref 70–99)

## 2023-10-02 LAB — CBC
HCT: 31.2 % — ABNORMAL LOW (ref 36.0–46.0)
Hemoglobin: 9.7 g/dL — ABNORMAL LOW (ref 12.0–15.0)
MCH: 28.1 pg (ref 26.0–34.0)
MCHC: 31.1 g/dL (ref 30.0–36.0)
MCV: 90.4 fL (ref 80.0–100.0)
Platelets: 287 10*3/uL (ref 150–400)
RBC: 3.45 MIL/uL — ABNORMAL LOW (ref 3.87–5.11)
RDW: 18.8 % — ABNORMAL HIGH (ref 11.5–15.5)
WBC: 6.7 10*3/uL (ref 4.0–10.5)
nRBC: 0 % (ref 0.0–0.2)

## 2023-10-02 LAB — LEGIONELLA PNEUMOPHILA SEROGP 1 UR AG: L. pneumophila Serogp 1 Ur Ag: NEGATIVE

## 2023-10-02 MED ORDER — LOPERAMIDE HCL 2 MG PO CAPS
4.0000 mg | ORAL_CAPSULE | Freq: Once | ORAL | Status: AC
  Administered 2023-10-02: 4 mg via ORAL
  Filled 2023-10-02: qty 2

## 2023-10-02 MED ORDER — CLONIDINE HCL 0.1 MG PO TABS
0.1000 mg | ORAL_TABLET | Freq: Three times a day (TID) | ORAL | Status: DC
Start: 2023-10-02 — End: 2023-10-02
  Administered 2023-10-02 (×2): 0.1 mg via ORAL
  Filled 2023-10-02 (×2): qty 1

## 2023-10-02 MED ORDER — APIXABAN 5 MG PO TABS
5.0000 mg | ORAL_TABLET | Freq: Two times a day (BID) | ORAL | 0 refills | Status: DC
Start: 2023-10-02 — End: 2024-04-23

## 2023-10-02 MED ORDER — GUAIFENESIN-DM 100-10 MG/5ML PO SYRP
5.0000 mL | ORAL_SOLUTION | ORAL | Status: DC | PRN
  Administered 2023-10-02 (×2): 5 mL via ORAL
  Filled 2023-10-02 (×2): qty 10

## 2023-10-02 NOTE — Progress Notes (Signed)
 [facility]  ROUNDING NOTE   Subjective:   Patient seen sitting up in bed Eating breakfast Alert and oriented Lower extremity edema improved.   Creatinine 3.07  Objective:  Vital signs in last 24 hours:  Temp:  [97.4 F (36.3 C)-99.1 F (37.3 C)] 97.4 F (36.3 C) (04/07 0843) Pulse Rate:  [85-91] 85 (04/07 0353) Resp:  [16-21] 16 (04/07 0843) BP: (153-165)/(66-72) 163/66 (04/07 0843) SpO2:  [95 %-99 %] 95 % (04/07 0353)  Weight change:  Filed Weights   09/27/23 0529 09/28/23 0500 09/29/23 0703  Weight: 133.7 kg 131.9 kg 131.3 kg    Intake/Output: I/O last 3 completed shifts: In: 1217.8 [P.O.:460; I.V.:657.8; IV Piggyback:100] Out: 1475 [Urine:1475]   Intake/Output this shift:  Total I/O In: 120 [P.O.:120] Out: -   Physical Exam: General: NAD  Head: Normocephalic, atraumatic. Dry oral mucosa  Eyes: Anicteric  Lungs:  Clear to auscultation  Heart: Regular rhythm, tachycardia   Abdomen:  Soft, nontender,   Extremities:  1+ peripheral edema.  Neurologic: Alert  Skin: No lesions  Access: None  GU Foley  Basic Metabolic Panel: Recent Labs  Lab 09/26/23 0125 09/27/23 0151 09/28/23 0435 09/29/23 0549 09/30/23 0452 10/01/23 0715 10/02/23 0635  NA 141 140 145 144 146* 145 145  K 4.4 4.6 4.4 4.0 3.6 3.3* 3.5  CL 110 106 111 111 113* 113* 113*  CO2 19* 19* 22 23 21* 22 22  GLUCOSE 170* 171* 85 197* 100* 89 73  BUN 45* 50* 50* 51* 47* 45* 41*  CREATININE 3.33* 3.81* 4.02* 3.89* 3.49* 3.39* 3.07*  CALCIUM 8.4* 8.5* 8.2* 8.4* 8.5* 8.2* 8.1*  MG 2.0 2.2 2.1  --   --   --   --   PHOS 5.6* 5.7* 5.7*  --   --   --   --     Liver Function Tests: Recent Labs  Lab 09/25/23 2328  AST 19  ALT 11  ALKPHOS 62  BILITOT 0.7  PROT 6.6  ALBUMIN 2.2*   No results for input(s): "LIPASE", "AMYLASE" in the last 168 hours. Recent Labs  Lab 09/27/23 0859  AMMONIA 16    CBC: Recent Labs  Lab 09/25/23 2328 09/26/23 0125 09/28/23 0435  09/29/23 0549 09/30/23 0452 10/01/23 0715 10/02/23 0635  WBC 12.6*   < > 9.6  9.8 8.9 8.0 7.1 6.7  NEUTROABS 10.3*  --  7.4  --   --   --   --   HGB 9.3*   < > 9.6*  9.5* 9.6* 10.8* 10.7* 9.7*  HCT 30.5*   < > 32.2*  31.0* 31.2* 35.6* 35.1* 31.2*  MCV 90.2   < > 92.8  89.3 90.2 90.8 90.9 90.4  PLT 361   < > 317  317 309 333 292 287   < > = values in this interval not displayed.    Cardiac Enzymes: Recent Labs  Lab 09/28/23 0533  CKTOTAL 53    BNP: Invalid input(s): "POCBNP"  CBG: Recent Labs  Lab 10/01/23 1946 10/02/23 0031 10/02/23 0357 10/02/23 0838 10/02/23 1228  GLUCAP 106* 74 70 69* 94    Microbiology: Results for orders placed or performed during the hospital encounter of 09/24/23  Resp panel by RT-PCR (RSV, Flu A&B, Covid) Anterior Nasal Swab     Status: None   Collection Time: 09/24/23 12:21 PM   Specimen: Anterior Nasal Swab  Result Value Ref Range Status   SARS Coronavirus 2 by RT PCR NEGATIVE NEGATIVE Final  Comment: (NOTE) SARS-CoV-2 target nucleic acids are NOT DETECTED.  The SARS-CoV-2 RNA is generally detectable in upper respiratory specimens during the acute phase of infection. The lowest concentration of SARS-CoV-2 viral copies this assay can detect is 138 copies/mL. A negative result does not preclude SARS-Cov-2 infection and should not be used as the sole basis for treatment or other patient management decisions. A negative result may occur with  improper specimen collection/handling, submission of specimen other than nasopharyngeal swab, presence of viral mutation(s) within the areas targeted by this assay, and inadequate number of viral copies(<138 copies/mL). A negative result must be combined with clinical observations, patient history, and epidemiological information. The expected result is Negative.  Fact Sheet for Patients:  BloggerCourse.com  Fact Sheet for Healthcare Providers:   SeriousBroker.it  This test is no t yet approved or cleared by the [place] FDA and  has been authorized for detection and/or diagnosis of SARS-CoV-2 by FDA under an Emergency Use Authorization (EUA). This EUA will remain  in effect (meaning this test can be used) for the duration of the COVID-19 declaration under Section 564(b)(1) of the Act, 21 U.S.C.section 360bbb-3(b)(1), unless the authorization is terminated  or revoked sooner.       Influenza A by PCR NEGATIVE NEGATIVE Final   Influenza B by PCR NEGATIVE NEGATIVE Final    Comment: (NOTE) The Xpert Xpress SARS-CoV-2/FLU/RSV plus assay is intended as an aid in the diagnosis of influenza from Nasopharyngeal swab specimens and should not be used as a sole basis for treatment. Nasal washings and aspirates are unacceptable for Xpert Xpress SARS-CoV-2/FLU/RSV testing.  Fact Sheet for Patients: BloggerCourse.com  Fact Sheet for Healthcare Providers: SeriousBroker.it  This test is not yet approved or cleared by the [place] FDA and has been authorized for detection and/or diagnosis of SARS-CoV-2 by FDA under an Emergency Use Authorization (EUA). This EUA will remain in effect (meaning this test can be used) for the duration of the COVID-19 declaration under Section 564(b)(1) of the Act, 21 U.S.C. section 360bbb-3(b)(1), unless the authorization is terminated or revoked.     Resp Syncytial Virus by PCR NEGATIVE NEGATIVE Final    Comment: (NOTE) Fact Sheet for Patients: BloggerCourse.com  Fact Sheet for Healthcare Providers: SeriousBroker.it  This test is not yet approved or cleared by the [place] FDA and has been authorized for detection and/or diagnosis of SARS-CoV-2 by FDA under an Emergency Use Authorization (EUA). This EUA will remain in effect (meaning this test can be used) for  the duration of the COVID-19 declaration under Section 564(b)(1) of the Act, 21 U.S.C. section 360bbb-3(b)(1), unless the authorization is terminated or revoked.  Performed at [facility], [address], [place], [place] [address]   Urine Culture     Status: None   Collection Time: 09/24/23 12:21 PM   Specimen: Urine, Random  Result Value Ref Range Status   Specimen Description   Final    URINE, RANDOM Performed at [facility], [address] [place], [place] [address]    Special Requests   Final    NONE Reflexed from [phone] Performed at [facility], [address], [place], [place] [address]    Culture   Final    NO GROWTH Performed at [facility], [address], [place], [place] [address]    Report Status 09/25/2023 FINAL  Final  Blood Culture (routine x 2)     Status: None   Collection Time: 09/24/23 12:22 PM  Specimen: BLOOD LEFT FOREARM  Result Value Ref Range Status   Specimen Description BLOOD LEFT FOREARM  Final   Special Requests   Final    BOTTLES DRAWN AEROBIC ONLY Blood Culture results may not be optimal due to an inadequate volume of blood received in culture bottles   Culture   Final    NO GROWTH 5 DAYS Performed at [facility], [address], [place], [place] [address]    Report Status 09/29/2023 FINAL  Final  Blood Culture (routine x 2)     Status: None   Collection Time: 09/24/23 12:23 PM   Specimen: Left Antecubital; Blood  Result Value Ref Range Status   Specimen Description LEFT ANTECUBITAL  Final   Special Requests   Final    BOTTLES DRAWN AEROBIC AND ANAEROBIC Blood Culture adequate volume   Culture   Final    NO GROWTH 5 DAYS Performed at [facility], [address] [place], [place] [address]    Report Status 09/29/2023 FINAL  Final  MRSA Next Gen by PCR, Nasal     Status: None   Collection Time: 09/25/23  8:44 PM   Specimen: Nasal Mucosa; Nasal Swab  Result Value Ref Range  Status   MRSA by PCR Next Gen NOT DETECTED NOT DETECTED Final    Comment: (NOTE) The GeneXpert MRSA Assay (FDA approved for NASAL specimens only), is one component of a comprehensive MRSA colonization surveillance program. It is not intended to diagnose MRSA infection nor to guide or monitor treatment for MRSA infections. Test performance is not FDA approved in patients less than 45 years old. Performed at [facility], [address] [place], [place] [address]     Coagulation Studies: No results for input(s): "LABPROT", "INR" in the last 72 hours.   Urinalysis: No results for input(s): "COLORURINE", "LABSPEC", "PHURINE", "GLUCOSEU", "HGBUR", "BILIRUBINUR", "KETONESUR", "PROTEINUR", "UROBILINOGEN", "NITRITE", "LEUKOCYTESUR" in the last 72 hours.  Invalid input(s): "APPERANCEUR"    Imaging: No results found.    Medications:      acetaminophen (TYLENOL) oral liquid 160 mg/5 mL  650 mg Oral TID   Chlorhexidine Gluconate Cloth  6 each Topical Daily   cloNIDine  0.1 mg Oral TID   enoxaparin (LOVENOX) injection  130 mg Subcutaneous Q24H   hydrocerin   Topical BID   insulin aspart  0-15 Units Subcutaneous TID WC   insulin glargine-yfgn  15 Units Subcutaneous Daily   levothyroxine  100 mcg Oral Q0600   metoprolol tartrate  10 mg Intravenous Q6H   multivitamin with minerals  1 tablet Oral Daily   mouth rinse  15 mL Mouth Rinse 4 times per day   sodium chloride flush  3 mL Intravenous Q12H   thiamine  100 mg Oral Daily   benzonatate, diltiazem, guaiFENesin-dextromethorphan, HYDROmorphone (DILAUDID) injection, hydrOXYzine, iohexol, naLOXone (NARCAN)  injection, ondansetron (ZOFRAN) IV, mouth rinse  Assessment/ Plan:  [name] is a 68 y.o.  female with past medical history of chronic bilateral lower extremity wounds, left fibula osteomyelitis, HFrEF at last EF 45%,  chronic lymhedema, hypertension diabetes, and chronic kidney disease stage !!!b, who was  admitted to [facility] on 09/24/2023 for Sepsis [facility]) [A41.9]   Acute Kidney Injury on chronic kidney disease stage IIIb with baseline creatinine 1.4 and GFR of 40 on 09/12/23.  Acute kidney injury secondary to sepsis from infectious process.  Creatinine was 2.75 on admission and has peaked today 3.81. Foley catheter in place. BUN elevated, unlikely to cause  this level of confusion and somnolence.   Creatinine continues to improve. Urine output acceptable, overnight. No acute need for dialysis. Will continue to monitor.   Lab Results  Component Value Date   CREATININE 3.07 (H) 10/02/2023   CREATININE 3.39 (H) 10/01/2023   CREATININE 3.49 (H) 09/30/2023    Intake/Output Summary (Last 24 hours) at 10/02/2023 1437 Last data filed at 10/02/2023 0900 Gross per 24 hour  Intake 340 ml  Output 725 ml  Net -385 ml   2. Sepsis likely secondary to left fibular osteomyelitis. Reviewed by orthopedic surgery, no surgical intervention. Managing with wound care. Antibiotics completed.   3. Anemia of chronic kidney disease Lab Results  Component Value Date   HGB 9.7 (L) 10/02/2023    Hgb acceptable. Will monitor for now  4. Diabetes mellitus type II with chronic kidney disease/renal manifestations: insulin dependent. Home regimen includes Lantus, novolog. Most recent hemoglobin A1c is 8.1 on 09/06/23.   Glucose well maintained. Primary team will manage sliding scale insulin.   5. Hypertension with chronic kidney disease. Home regimen includes Lantus today.  Amlodipine, clonidine, furosemide, irbesartan, and metoprolol. Currently prescribed Metoprolol, clonidine, and diltiazem.     LOS: 8 Mikaila Grunert 4/7/20252:37 PM

## 2023-10-02 NOTE — Telephone Encounter (Signed)
 Patient Product/process development scientist completed.    The patient is insured through [employer]. Patient has Medicare and is not eligible for a copay card, but may be able to apply for patient assistance or Medicare RX Payment Plan (Patient Must reach out to their plan, if eligible for payment plan), if available.    Ran test claim for Eliquis 5 mg and the current 30 day co-pay is $0.00.  Ran test claim for Xarelto 20 mg and the current 30 day co-pay is $0.00.  This test claim was processed through [facility]- copay amounts may vary at other pharmacies due to pharmacy/plan contracts, or as the patient moves through the different stages of their insurance plan.     Roland Earl, CPHT Pharmacy Technician III Certified Patient Advocate [facility] Patient Advocate Team Direct Number: [phone]  Fax: [phone]

## 2023-10-02 NOTE — TOC Progression Note (Signed)
 Transition of Care [facility]) - Progression Note    Patient Details  Name: LAVERGNE HILTUNEN MRN: [id number] Date of Birth: 11-08-1955  Transition of Care [facility]) CM/SW Contact  Truddie Hidden, RN Phone Number: 10/02/2023, 11:10 AM  Clinical Narrative:    Attempt to reach Gavin Pound in admissions at [facility] to inform of patient's medical readiness. No answer. Left a message.          Expected Discharge Plan and Services                                               Social Determinants of Health (SDOH) Interventions SDOH Screenings   Food Insecurity: No Food Insecurity (09/25/2023)  Housing: Low Risk  (09/25/2023)  Transportation Needs: No Transportation Needs (09/25/2023)  Utilities: Not At Risk (09/25/2023)  Alcohol Screen: Low Risk  (03/10/2021)  Depression (PHQ2-9): Low Risk  (03/10/2021)  Financial Resource Strain: Low Risk  (03/10/2021)  Physical Activity: Inactive (03/10/2021)  Social Connections: Socially Isolated (09/25/2023)  Stress: No Stress Concern Present (03/10/2021)  Tobacco Use: Low Risk  (09/25/2023)    Readmission Risk Interventions    11/03/2021   12:41 PM  Readmission Risk Prevention Plan  Transportation Screening Complete  PCP or Specialist Appt within 3-5 Days Complete  HRI or Home Care Consult Complete  Social Work Consult for Recovery Care Planning/Counseling Complete  Palliative Care Screening Complete  Medication Review Oceanographer) Complete

## 2023-10-02 NOTE — Progress Notes (Signed)
 Previously seen by PMT as seen in note dated 4/6. [name], friend, called and advises that patient is being discharged back to [facility] facility today.  She is requesting ACP documentation be completed prior to patient's discharge.  Discussed that order will be placed for spiritual care to complete documentation but was not sure if that would be completed prior to patient returning to facility.  Order placed for spiritual care to complete ACP documentation.  No acute palliative care needs identified at this time.    Alex Gardener, Juel Burrow- [facility] Palliative Medicine Team  10/02/2023 2:14 PM  Office [phone]  Pager [phone]  NO CHARGE NOTE

## 2023-10-02 NOTE — TOC Transition Note (Addendum)
 Transition of Care [facility]) - Discharge Note   Patient Details  Name: [name] MRN: [id number] Date of Birth: [birth year]  Transition of [facility] CM/SW Contact:  Truddie Hidden, RN Phone Number: 10/02/2023, 2:16 PM   Clinical Narrative:    Per facility patient admission confirmed for today. Spoke with Gavin Pound in admissions. Patient assigned room # 104 at [facility] Report will be called to [phone] Face sheet and medical necessity forms printed to the floor to be added to the EMS pack EMS arranged "It'll be between now and 4:00pm."  Discharge summary and SNF transfer report sent in HUB.  Nurse, and family notified spoke with Angus Seller, friend  TOC signing off.          Patient Goals and CMS Choice            Discharge Placement                       Discharge Plan and Services Additional resources added to the After Visit Summary for                                       Social Drivers of Health (SDOH) Interventions SDOH Screenings   Food Insecurity: No Food Insecurity (09/25/2023)  Housing: Low Risk  (09/25/2023)  Transportation Needs: No Transportation Needs (09/25/2023)  Utilities: Not At Risk (09/25/2023)  Alcohol Screen: Low Risk  (03/10/2021)  Depression (PHQ2-9): Low Risk  (03/10/2021)  Financial Resource Strain: Low Risk  (03/10/2021)  Physical Activity: Inactive (03/10/2021)  Social Connections: Socially Isolated (09/25/2023)  Stress: No Stress Concern Present (03/10/2021)  Tobacco Use: Low Risk  (09/25/2023)     Readmission Risk Interventions    11/03/2021   12:41 PM  Readmission Risk Prevention Plan  Transportation Screening Complete  PCP or Specialist Appt within 3-5 Days Complete  HRI or Home Care Consult Complete  Social Work Consult for Recovery Care Planning/Counseling Complete  Palliative Care Screening Complete  Medication Review Oceanographer) Complete

## 2023-10-02 NOTE — Progress Notes (Signed)
 Nutrition Follow-up  DOCUMENTATION CODES:   Morbid obesity  INTERVENTION:   -Magic cup TID with meals, each supplement provides 290 kcal and 9 grams of protein  -Continue MVI with minerals daily -RD will follow for diet advancement and adjust supplement regimen as appropriate  NUTRITION DIAGNOSIS:   Inadequate oral intake related to inability to eat as evidenced by NPO status.  Progressing; advanced to PO diet on 09/29/23  GOAL:   Patient will meet greater than or equal to 90% of their needs  Progressing   MONITOR:   Diet advancement, TF tolerance  REASON FOR ASSESSMENT:   Consult Assessment of nutrition requirement/status, Enteral/tube feeding initiation and management  ASSESSMENT:   Pt with medical history significant of chronic bilateral lower extremity wounds with recent diagnosis of left fibula osteomyelitis, HFrEF with last EF of 45%, chronic lymphedema, hypertension, type 2 diabetes, PAD, CKD stage IIIa, who presents due to altered mental status.  4/1- NGT placed, KUB confirmed tip of tube in gastric fundus  4/2- s/p CT abdomen and pelvis, no SBO noted, surgery signed off 4/4- NGT removed by pt, s/p BSE- advanced to dysphagia 1 diet with nectar thick liquids 4/6- s/p BSE_ dysphagia 1 diet with nectar thick liquids  Reviewed  I/O's: -265 ml x 24 hours and +5.7 L since admission  UOP: 725 ml x 24 hours  Case discussed with RN; pt dislikes dysphagia 1 diet with nectar thick liquids. Plan for SLP to re-eval for possible diet advancement.   Spoke with pt at bedside, who was much more awake and alert at time of visit. Pt able to engage in conversation and make needs known. Pt reports goof appetite, consumed 100% of sausage, 75% of eggs, and 75% of cranberry juice. Pt reports she is not a fan of the puree diet and thickened liquids; RD discussed rationale of diet for swallow safety, however, SLP plan to re-evaluate for possible advancement. Pt reports "if I could get a  diet [facility] by the end of this, I would be so happy".   Pt complains of increased coughing, which has been present since last night. Pt also complains of diarrhea, which she reports "is from not eating much". Suspect diarrhea may be secondary to antibiotics. Pt denies any issue with diarrhea or swallowing PTA.  Discussed importance of good meal and supplement intake to promote healing  Wt has been stable since admission.   Palliative care following for goals of care discussions.   Medications reviewed and include lovenox, imodium, and thiamine.   Labs reviewed: CBGS: 69-106 (inpatient orders for glycemic control are 0-15 units insulin aspart TID with meals and 15 units insulin glargine-yfgn daily).    Diet Order:   Diet Order             DIET - DYS 1 Fluid consistency: Nectar Thick  Diet effective now                   EDUCATION NEEDS:   Not appropriate for education at this time  Skin:  Skin Assessment: Skin Integrity Issues: Skin Integrity Issues:: Other (Comment) Other: chronic venous stasis ulcers of both legs with chronic vs acute osteomyelitis of the left fibula  Last BM:  10/02/23 (type 6)  Height:   Ht Readings from Last 1 Encounters:  09/24/23 5\' 5"  (1.651 m)    Weight:   Wt Readings from Last 1 Encounters:  09/29/23 131.3 kg    Ideal Body Weight:  56.8 kg  BMI:  Body mass index is 48.17 kg/m.  Estimated Nutritional Needs:   Kcal:  1800-2000  Protein:  100-115 grams  Fluid:  1.8-2.0 L    [name], RD, LDN, CDCES Registered Dietitian III Certified Diabetes Care and Education Specialist If unable to reach this RD, please use "RD Inpatient" group chat on secure chat between hours of 8am-4 pm daily

## 2023-10-02 NOTE — Progress Notes (Signed)
   10/02/23 1605  Spiritual Encounters  Type of Visit Initial  Care provided to: Patient  Conversation partners present during encounter Other (comment) (Transition of Care Team)  Referral source Physician  Reason for visit Advance directives  OnCall Visit Yes  Advance Directives (For Healthcare)  Does Patient Have a Medical Advance Directive? No  Would patient like information on creating a medical advance directive?  (NP notes Pt is sometimes confused; Chaplain unable to complete AD)

## 2023-10-02 NOTE — Progress Notes (Addendum)
 Date of Admission:  09/24/2023      ID: [name] is a 68 y.o. female Principal Problem:   Sepsis (HCC) Active Problems:   Essential hypertension   Hypothyroidism   Acute kidney injury superimposed on CKD (HCC)   PVD (peripheral vascular disease) (HCC)   Type II diabetes mellitus with renal manifestations (HCC)   Chronic systolic CHF (congestive heart failure) (HCC)   Osteomyelitis of left fibula (HCC)   Acute encephalopathy   Acute congestive heart failure (HCC)   Altered mental status    Subjective: She is fuuly alert abd back to base line Has some cough No fever   Medications:   acetaminophen (TYLENOL) oral liquid 160 mg/5 mL  650 mg Oral TID   Chlorhexidine Gluconate Cloth  6 each Topical Daily   cloNIDine  0.1 mg Oral TID   enoxaparin (LOVENOX) injection  130 mg Subcutaneous Q24H   hydrocerin   Topical BID   insulin aspart  0-15 Units Subcutaneous TID WC   insulin glargine-yfgn  15 Units Subcutaneous Daily   levothyroxine  100 mcg Oral Q0600   metoprolol tartrate  10 mg Intravenous Q6H   multivitamin with minerals  1 tablet Oral Daily   mouth rinse  15 mL Mouth Rinse 4 times per day   sodium chloride flush  3 mL Intravenous Q12H   thiamine  100 mg Oral Daily    Objective: Vital signs in last 24 hours: Patient Vitals for the past 24 hrs:  BP Temp Temp src Pulse Resp SpO2  10/02/23 0843 (!) 163/66 (!) 97.4 F (36.3 C) Oral -- 16 --  10/02/23 0353 (!) 165/71 98.4 F (36.9 C) -- 85 (!) 21 95 %  10/01/23 2331 -- 98.1 F (36.7 C) Oral 89 16 95 %  10/01/23 1950 (!) 162/72 99.1 F (37.3 C) Oral 91 18 95 %  10/01/23 1543 (!) 153/66 98.4 F (36.9 C) Oral 85 16 99 %      PHYSICAL EXAM:  General: awake,alert Repsonds to questions  PERL  Lungs: b/l air entry. Heart: s1s2 Abdomen: Soft, non-tender,not distended. Bowel sounds normal. No masses Extremities:superficial skin ulceration- both legs Left leg there is no deep wound Furrowing on the left  lateral side          Lab Results    Latest Ref Rng & Units 10/02/2023    6:35 AM 10/01/2023    7:15 AM 09/30/2023    4:52 AM  CBC  WBC 4.0 - 10.5 K/uL 6.7  7.1  8.0   Hemoglobin 12.0 - 15.0 g/dL 9.7  41.3  24.4   Hematocrit 36.0 - 46.0 % 31.2  35.1  35.6   Platelets 150 - 400 K/uL 287  292  333        Latest Ref Rng & Units 10/02/2023    6:35 AM 10/01/2023    7:15 AM 09/30/2023    4:52 AM  CMP  Glucose 70 - 99 mg/dL 73  89  010   BUN 8 - 23 mg/dL 41  45  47   Creatinine 0.44 - 1.00 mg/dL 2.72  5.36  6.44   Sodium 135 - 145 mmol/L 145  145  146   Potassium 3.5 - 5.1 mmol/L 3.5  3.3  3.6   Chloride 98 - 111 mmol/L 113  113  113   CO2 22 - 32 mmol/L 22  22  21    Calcium 8.9 - 10.3 mg/dL 8.1  8.2  8.5  Microbiology: [facility] Ng  MRI of the left leg from 3/14 There is moderate to high-grade cortical erosion within the lateral fibular diaphysis throughout the slightly proximal diaphysis through the distal diaphysis along an approximate 11 cm length in the region of the high-grade lateral calf ulcer. There is high-grade marrow edema and enhancement of the fibular diaphysis in these regions. Moderate periosteal edema and enhancement. This is indicative of acute osteomyelitis    Assessment/Plan: 68 y.o. 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 She presented from SNF with altered mental status and not eating and drinking for a few days? ? ?   Acute toxic encephalopathy- due to AKI on CKD and CNS effect of opioids and cefepime causing encephalopathy, myoclonus.  resolved      AKI on CKD- started to improve   Paroxysmal afib CT abdomn  revealed b/l lung infiltrate- need a dedicated imaging of chest   Obesity   Bed bound status    PAD B/l venous superfical ulceration of the legs Left > rt No deep wounds/no ulcers Skin intact Treated like soft tissue infection for 2 weeks 3/14-3/29 With broad spectrum antibiotics No active infection No further antibiotic is needed as futile to treat the underlying bone changes on MRI because of her immobility and  Remove PICC Will not treat the MRI findings suggestive of Fibula osteo  Pt needs air mattress Off loading the leg And position change-  Topical care   Hypothyroidism    h/o rt THA Discussed the management with hospitalist

## 2023-10-02 NOTE — [place] FL2 (Signed)
 [place] MEDICAID FL2 LEVEL OF CARE FORM     IDENTIFICATION  Patient Name: [name] Birthdate: [birth year] Sex: female Admission Date (Current Location): 09/24/2023  [place] and [place] Number:  Chiropodist and Address:  [facility], [address] [place], [place] [address]      Provider Number: [id number]  Attending Physician Name and Address:  Charise Killian, MD  Relative Name and Phone Number:  Cyndia Bent (Friend)  [phone] (Home Phone)    Current Level of Care: Hospital Recommended Level of Care: Skilled Nursing Facility Prior Approval Number:    Date Approved/Denied:   PASRR Number: [id number]  Discharge Plan: SNF    Current Diagnoses: Patient Active Problem List   Diagnosis Date Noted   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) 10/02/2023   Altered mental status 09/29/2023   Acute congestive heart failure (HCC) 09/26/2023   Acute encephalopathy 09/24/2023   Multiple open wounds of lower leg, initial encounter 09/12/2023   Thrombocytosis 09/10/2023   Anemia of chronic disease 09/10/2023   Osteomyelitis of left fibula (HCC) 09/09/2023   Type II diabetes mellitus with renal manifestations (HCC) 09/04/2023   Chronic systolic CHF (congestive heart failure) (HCC) 09/04/2023   Obesity, Class III, BMI 40-49.9 (morbid obesity) (HCC) 09/04/2023   DKA (diabetic ketoacidosis) (HCC) 10/30/2021   PVD (peripheral vascular disease) (HCC)    Vertigo    Sepsis without acute organ dysfunction (HCC)    Acute kidney injury superimposed on CKD (HCC)    Adjustment disorder with anxiety 04/21/2021   Sepsis (HCC) 04/17/2021   Pressure injury of skin 04/17/2021   Left leg pain 04/16/2021   Morbid obesity (HCC) 10/24/2017   Chronic kidney disease, stage IV (severe) (HCC) 10/24/2017   Hypothyroidism 11/15/2016   Edema 11/15/2016   Type 2 diabetes mellitus with hyperlipidemia (HCC) 11/15/2016    Essential hypertension 12/09/2015   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) 06/26/2015   Pressure ulcer 05/31/2015   Primary osteoarthritis of right hip 05/28/2015    Orientation RESPIRATION BLADDER Height & Weight     Self, Place  Normal External catheter, Incontinent Weight: 131.3 kg Height:  5\' 5"  (165.1 cm)  BEHAVIORAL SYMPTOMS/MOOD NEUROLOGICAL BOWEL NUTRITION STATUS  Other (Comment) (n/a)  (n/a) Incontinent Diet  AMBULATORY STATUS COMMUNICATION OF NEEDS Skin   Total Care  (gestures and nods)  (Dry, Flaky, eecyhmosis bialteral arms and buttock, bilateral weeping legs, venous status ulcers bilateral pretibial right and left  pink, scattered bleeding and scabbing)                       Personal Care Assistance Level of Assistance  Total care Bathing Assistance: Maximum assistance Feeding assistance: Maximum assistance Dressing Assistance: Maximum assistance     Functional Limitations Info  Sight Sight Info: Impaired Hearing Info: Adequate Speech Info:  (Nods and gestures)    SPECIAL CARE FACTORS FREQUENCY                       Contractures Contractures Info: Not present    Additional Factors Info  Code Status, Allergies Code Status Info: FULL Allergies Info: NKDA           Current Medications (10/02/2023):  This is the current hospital active medication list Current Facility-Administered Medications  Medication Dose Route Frequency Provider Last Rate Last Admin   acetaminophen (TYLENOL) 160 MG/5ML solution 650 mg  650  mg Oral TID Orson Aloe, RPH   650 mg at 10/02/23 5284   benzonatate (TESSALON) capsule 200 mg  200 mg Oral TID PRN Charise Killian, MD   200 mg at 10/02/23 1324   Chlorhexidine Gluconate Cloth 2 % PADS 6 each  6 each Topical Daily Gillis Santa, MD   6 each at 10/02/23 1149   cloNIDine (CATAPRES) tablet 0.1 mg  0.1 mg Oral TID Charise Killian, MD   0.1 mg at 10/02/23 1149    diltiazem (CARDIZEM) injection 10 mg  10 mg Intravenous Q6H PRN Gillis Santa, MD       enoxaparin (LOVENOX) injection 130 mg  130 mg Subcutaneous Q24H Effie Shy, RPH   130 mg at 10/01/23 1342   guaiFENesin-dextromethorphan (ROBITUSSIN DM) 100-10 MG/5ML syrup 5 mL  5 mL Oral Q4H PRN Charise Killian, MD   5 mL at 10/02/23 1148   hydrocerin (EUCERIN) cream   Topical BID Gillis Santa, MD   Given at 10/02/23 [phone]   HYDROmorphone (DILAUDID) injection 0.5 mg  0.5 mg Intravenous Q4H PRN Gillis Santa, MD   0.5 mg at 09/28/23 1746   hydrOXYzine (ATARAX) tablet 10 mg  10 mg Oral Q8H PRN Gillis Santa, MD   10 mg at 09/30/23 1624   insulin aspart (novoLOG) injection 0-15 Units  0-15 Units Subcutaneous TID WC Verdene Lennert, MD   2 Units at 09/30/23 1219   insulin glargine-yfgn [facility]) injection 15 Units  15 Units Subcutaneous Daily Orson Aloe, [facility]   15 Units at 10/01/23 2126   iohexol (OMNIPAQUE) 300 MG/ML solution 30 mL  30 mL Oral Once PRN Orson Aloe, [facility]       levothyroxine (SYNTHROID) tablet 100 mcg  100 mcg Oral Q0600 Gillis Santa, MD   100 mcg at 10/02/23 0546   metoprolol tartrate (LOPRESSOR) injection 10 mg  10 mg Intravenous Q6H Gillis Santa, MD   10 mg at 10/02/23 1149   multivitamin with minerals tablet 1 tablet  1 tablet Oral Daily Orson Aloe, [facility]   1 tablet at 10/02/23 2725   naloxone [facility]) injection 0.4 mg  0.4 mg Intravenous PRN Gillis Santa, MD       ondansetron [facility]) injection 4 mg  4 mg Intravenous Q6H PRN Verdene Lennert, MD   4 mg at 09/25/23 0245   Oral care mouth rinse  15 mL Mouth Rinse 4 times per day Gillis Santa, MD   15 mL at 10/02/23 1307   Oral care mouth rinse  15 mL Mouth Rinse PRN Gillis Santa, MD       sodium chloride flush (NS) 0.9 % injection 3 mL  3 mL Intravenous Q12H Verdene Lennert, MD   3 mL at 10/02/23 3664   thiamine (VITAMIN B1) tablet 100 mg  100 mg Oral Daily Orson Aloe, RPH   100 mg at 10/02/23 4034      Discharge Medications: Please see discharge summary for a list of discharge medications.  Relevant Imaging Results:  Relevant Lab Results:   Additional Information SSN [id number]  Truddie Hidden, RN

## 2023-10-02 NOTE — Plan of Care (Signed)

## 2023-10-02 NOTE — Discharge Summary (Addendum)
 Physician Discharge Summary  [name]:[id number] DOB: 02/24/1956 DOA: 09/24/2023  PCP: Oneita Hurt, No  Admit date: 09/24/2023 Discharge date: 10/02/2023  Admitted From: LTC Disposition:  LTC  Recommendations for Outpatient Follow-up:  Follow up with PCP in 1-2 weeks   Home Health: no  Equipment/Devices:  Discharge Condition: stable  CODE STATUS: DNR Diet recommendation: Carb Modified   Brief/Interim Summary: HPI was taken from Dr. Huel Cote:  [name] is a 68 y.o. female with medical history significant of chronic bilateral lower extremity wounds with recent diagnosis of left fibula osteomyelitis, HFrEF with last EF of 45%, chronic lymphedema, hypertension, type 2 diabetes, PAD, CKD stage IIIa, who presents to the ED due to altered mental status.   History obtained through chart review due to patient's altered mental status. Per facility staff at [facility], patient has become increasingly altered over the past 1 to 2 days and has been refusing to take her medications.  At baseline, she is alert and oriented x 4 with no underlying history of dementia.  No reports of fever.   Per chart review, patient was recently admitted on 09/04/2023 and was discovered to have a left fibula osteomyelitis.  Cultures grew Staph aureus and Providencia.  She was discharged on cefepime and daptomycin with plans to continue IV antibiotics through October 19, 2023.   ED course: On arrival to the ED, patient was normotensive at 117/104 with heart rate of 90.  She was saturating at 100% on room air.  She was afebrile at 98.3.  Initial workup notable for hemoglobin of 10.9, platelets 415, bicarb 21, BUN 38, creatinine 2.75, and GFR of 18.  Lactic acid 2.7.  Urinalysis with ketonuria, proteinuria, few bacteria.  CT head with no acute intracranial abnormalities.  Chest x-ray with low lung volumes.  Patient started on Rocephin, vancomycin, IV fluids and pain medication.  [facility] contacted for  admission.  Discharge Diagnoses:  Principal Problem:   Sepsis (HCC) Active Problems:   Osteomyelitis of left fibula (HCC)   Acute encephalopathy   Acute kidney injury superimposed on CKD (HCC)   Type II diabetes mellitus with renal manifestations (HCC)   Chronic systolic CHF (congestive heart failure) (HCC)   PVD (peripheral vascular disease) (HCC)   Hypothyroidism   Essential hypertension   Acute congestive heart failure (HCC)   Altered mental status  Acute encephalopathy: etiology unclear, drug induced from narcotics vs cefepime vs delirium. MRI brain shows no acute intracranial abnormalities. EEG showed no seizures. Mental status is back to baseline.   PAF: continue on metoprolol and start eliquis at d/c as pt was previously on lovenox. Pt has a high risk of bleeding w/ eliquis and plavix    Sepsis: secondary to left fibular osteomyelitis. See Dr. Remus Blake note on how pt met sepsis criteria. Completed abx course already as per ID. No surgical intervention as per ortho surg. Sepsis resolved    AKI on CKDIIIb: Cr is labile. Avoid nephrotoxic meds    Hyponatremia: WNL today  Hyperkalemia: resolved   Metabolic acidosis: resolved   DM2: likely poorly controlled. Restart home anti-DM meds at d/c   Chronic systolic CHF: continue on metoprolol. Monitor I/Os  HTN: continue on metoprolol, amlodipine, clonidine   Hx of PAD:  continue on home dose of plavix.    Hypothyroidism: continue on levothyroxine. Will need to get thyroid levels outpatient    Hyperphosphatemia: secondary to AKI on CKD.    Chronic pain: tylenol and dilaudid prn for pain  Discharge Instructions  Discharge Instructions     Diet Carb Modified   Complete by: As directed    Discharge instructions   Complete by: As directed    F/u w/ PCP in 1-2 weeks.   Discharge wound care:   Complete by: As directed    Wound care  Daily      Comments: Cleanse bilateral lower legs (intact skin and areas of ulceration)  with Vashe wound cleanser Hart [place] [phone]), do not rinse and allow to air dry. Apply Xeroform gauze Hart [place] [phone]) to wound beds daily, cover with ABD pads and secure with Kerlix roll gauze wrapped starting just above toes and ending right below knees.  May secure entire dressing with Ace bandage wrapped in same fashion as Kerlix roll gauze which will also provide some light compression   Increase activity slowly   Complete by: As directed       Allergies as of 10/02/2023   No Known Allergies      Medication List     STOP taking these medications    (feeding supplement) PROSource Plus liquid   camphor-menthol lotion Commonly known as: SARNA   ceFEPime IVPB Commonly known as: MAXIPIME   daptomycin IVPB Commonly known as: CUBICIN   gentamicin ointment 0.1 % Commonly known as: GARAMYCIN   Gerhardt's butt cream Crea   Vitamin D (Ergocalciferol) 1.25 MG (50000 UNIT) Caps capsule Commonly known as: DRISDOL       TAKE these medications    acetaminophen 325 MG tablet Commonly known as: TYLENOL Take 2 tablets (650 mg total) by mouth every 6 (six) hours as needed for mild pain (or Fever >/= 101).   amLODipine 5 MG tablet Commonly known as: NORVASC Take 5 mg by mouth daily.   apixaban 5 MG Tabs tablet Commonly known as: ELIQUIS Take 1 tablet (5 mg total) by mouth 2 (two) times daily.   ascorbic acid 500 MG tablet Commonly known as: VITAMIN C Take 1 tablet (500 mg total) by mouth daily.   blood glucose meter kit and supplies 1 each by Other route daily. ONE TOUCH ULTRA METER. Use as directed. E11.29   cloNIDine 0.3 MG tablet Commonly known as: CATAPRES TAKE ONE TABLET BY MOUTH 3 TIMES DAILY.   clopidogrel 75 MG tablet Commonly known as: PLAVIX Take 1 tablet (75 mg total) by mouth daily with breakfast.   diclofenac Sodium 1 % Gel Commonly known as: VOLTAREN Apply 2 g topically 3 (three) times daily.   ferrous gluconate 324 MG tablet Commonly known as:  FERGON Take 324 mg by mouth daily with breakfast.   furosemide 20 MG tablet Commonly known as: LASIX Take 1 tablet (20 mg total) by mouth 2 (two) times daily.   gabapentin 300 MG capsule Commonly known as: NEURONTIN Take 1 capsule (300 mg total) by mouth at bedtime.   glucose blood test strip 1 each by Other route daily. ONE TOUCH ULTRA TEST STRIPS E11.29   HYDROmorphone 2 MG tablet Commonly known as: DILAUDID Take 0.5 tablets (1 mg total) by mouth every 4 (four) hours as needed for moderate pain (pain score 4-6).   HYDROmorphone 2 MG tablet Commonly known as: DILAUDID Take 1 tablet (2 mg total) by mouth every 12 (twelve) hours as needed (with dressing changes).   hydrOXYzine 10 MG tablet Commonly known as: ATARAX Take 10 mg by mouth every 8 (eight) hours as needed for itching.   insulin aspart 100 UNIT/ML injection Commonly known as: novoLOG Inject 3 Units into the  skin 3 (three) times daily with meals.   irbesartan 150 MG tablet Commonly known as: AVAPRO Take 1 tablet (150 mg total) by mouth daily.   Lantus SoloStar 100 UNIT/ML Solostar Pen Generic drug: insulin glargine Inject 14 Units into the skin every 12 (twelve) hours.   levothyroxine 200 MCG tablet Commonly known as: SYNTHROID Take 200 mcg by mouth daily before breakfast.   metoprolol succinate 25 MG 24 hr tablet Commonly known as: TOPROL-XL Take 0.5 tablets (12.5 mg total) by mouth daily with breakfast.   multivitamin with minerals Tabs tablet Take 1 tablet by mouth daily.   nystatin powder Generic drug: nystatin Apply 1 application. topically 3 (three) times daily.   ondansetron 4 MG disintegrating tablet Commonly known as: ZOFRAN-ODT Take 2 mg by mouth every 6 (six) hours as needed.   onetouch ultrasoft lancets 1 each by Other route daily. Use as instructed E11.29   senna-docusate 8.6-50 MG tablet Commonly known as: Senokot-S Take 2 tablets by mouth at bedtime.   zinc oxide 20 %  ointment Apply 1 Application topically as needed for diaper changes.               Discharge Care Instructions  (From admission, onward)           Start     Ordered   10/02/23 0000  Discharge wound care:       Comments: Wound care  Daily      Comments: Cleanse bilateral lower legs (intact skin and areas of ulceration) with Vashe wound cleanser Hart [place] [phone]), do not rinse and allow to air dry. Apply Xeroform gauze Hart [place] [phone]) to wound beds daily, cover with ABD pads and secure with Kerlix roll gauze wrapped starting just above toes and ending right below knees.  May secure entire dressing with Ace bandage wrapped in same fashion as Kerlix roll gauze which will also provide some light compression   10/02/23 1316            No Known Allergies  Consultations: ID Palliative care  ICU   Procedures/Studies: CT ABDOMEN PELVIS WO CONTRAST Result Date: 09/27/2023 CLINICAL DATA:  Sepsis and abdominal pain. Suspect small bowel obstruction. EXAM: CT ABDOMEN AND PELVIS WITHOUT CONTRAST TECHNIQUE: Multidetector CT imaging of the abdomen and pelvis was performed following the standard protocol without IV contrast following administration of oral contrast only. RADIATION DOSE REDUCTION: This exam was performed according to the departmental dose-optimization program which includes automated exposure control, adjustment of the mA and/or kV according to patient size and/or use of iterative reconstruction technique. COMPARISON:  CT without contrast 04/02/2022, CT with IV contrast 10/30/2021. FINDINGS: Lower chest: The cardiac size is normal. The mitral ring is heavily calcified. There are three-vessel coronary calcifications. There is no pericardial effusion. There is patchy airspace consolidation throughout the lung fields most likely due to multifocal pneumonia. Trace bilateral pleural effusions. A Dobbhoff feeding tube extends to the left and posteriorly within the stomach with the tip in  the proximal lumen. Hepatobiliary: There is loss of fine detail due to respiratory motion. The liver is steatotic. No mass is seen through motion artifact. There are several rim calcified gallstones largest is 1.4 cm. No wall thickening or biliary dilatation. Pancreas: Partially atrophic. Otherwise unremarkable without contrast. Spleen: No abnormality is seen through the breathing motion. Adrenals/Urinary Tract: There is no adrenal mass. No new contour deforming renal abnormality is seen through the motion artifacts. There are Bosniak 1 cysts in the right kidney, in the upper pole  measuring 3.2 cm and 18 Hounsfield units, in the lower pole measuring 1.4 cm and 1.8 Hounsfield units. No follow-up imaging is recommended. The unenhanced kidneys are otherwise unremarkable. There is no urinary stone or obstruction. The bladder is catheterized, contracted and obscured by metal spray artifact from a right hip replacement. Stomach/Bowel: No dilatation or wall thickening. No bowel obstruction or inflammation is seen. Normal subcecal appendix. Scattered sigmoid diverticulosis without evidence of diverticulitis. Vascular/Lymphatic: Aortic atherosclerosis. No enlarged abdominal or pelvic lymph nodes. Reproductive: Uterus and bilateral adnexa are unremarkable. Other: There is evidence of recent injections into the subcutaneous abdominal wall fat. Bilateral stranding noted on the right greater than left in the redundant fatty soft tissues of the left and right abdominal wall, could be due to cellulitis or third spacing of fluid reserves and does not appear to have been present previously. Traumatic etiology could explain this in the appropriate clinical setting. There is trace presacral ascites. No other free fluid. There is no free hemorrhage, free air, or localizing inflammatory process. Musculoskeletal: Right hip arthroplasty. Osteopenia with advanced degenerative change of the spine. 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 Multilevel foraminal stenosis. No acute or other significant osseous findings. IMPRESSION: 1. Patchy airspace consolidation throughout the lung fields most likely due to multifocal pneumonia. 2. No bowel obstruction or inflammation. 3. Trace bilateral pleural effusions.  Trace presacral ascites. 4. Dobbhoff feeding tube tip in the proximal stomach. 5. Hepatic steatosis.  Cholelithiasis. 6. Diverticulosis without evidence of diverticulitis. 7. Bilateral stranding in the redundant superficial fatty soft tissues of the left and right abdominal wall, could be due to cellulitis or third spacing of fluid reserves. Traumatic etiology could explain this in the appropriate clinical setting. 8. Aortic and coronary artery atherosclerosis. 9. Osteopenia and advanced degenerative change of the spine. Electronically Signed   By: Almira Bar M.D.   On: 09/27/2023 21:20   EEG adult Result Date: 09/27/2023 Charlsie Quest, MD     09/27/2023  4:31 PM Patient Name: [name] MRN: [id number] Epilepsy Attending: Charlsie Quest Referring Physician/Provider: Gillis Santa, MD Date: 09/27/2023 Duration: 23.05 mins Patient history: 68yo F with ams. EEG to evaluate for seizure Level of alertness: awake/ lethargic AEDs during EEG study: None Technical aspects: This EEG study was done with scalp electrodes positioned according to the 10-20 International system of electrode placement. Electrical activity was reviewed with band pass filter of 1-70Hz , sensitivity of 7 uV/mm, display speed of 9mm/sec with a 60Hz  notched filter applied as appropriate. EEG data were recorded continuously and digitally stored.  Video monitoring was available and reviewed as appropriate. Description: EEG showed continuous generalized 3-5 Asatab and delta slowing. Generalized periodic discharges with triphasic morphology were noted at 1.5 to 2.5 Hz.  Photic driving was not seen during photic stimulation. Hyperventilation was not performed.    ABNORMALITY -Periodic discharges with triphasic morphology, generalized - Continuous slow, generalized IMPRESSION: This study showed generalized periodic discharges with triphasic morphology.  These discharges can be on the ictal-interictal continuum.  However the frequency and morphology is most likely suggestive of toxic-metabolic causes like cefepime toxicity, renal dysfunction, hyperammonemia.  If concern for ictal-interictal activity persist, long-term EEG can be considered. Additionally there is moderate diffuse encephalopathy.  No seizures were noted. Charlsie Quest   DG Abd 1 View Result Date: 09/27/2023 CLINICAL DATA:  Enteric catheter placement EXAM: ABDOMEN - 1 VIEW COMPARISON:  09/26/2023 FINDINGS: Frontal view of the left hemiabdomen was obtained, excluding the right hemiabdomen  and pelvis by collimation. Enteric catheter identified passing below diaphragm, tip projecting over the gastric fundus. Multiple distended gas-filled loops of small bowel are seen within the central abdomen, measuring up to 3 cm in diameter. Paucity of colonic gas. IMPRESSION: 1. Enteric catheter tip projecting over the gastric fundus. 2. Diffuse gaseous distention of the small bowel with relative paucity of colonic gas, which may reflect small-bowel obstruction. Electronically Signed   By: Sharlet Salina M.D.   On: 09/27/2023 14:43   ECHOCARDIOGRAM COMPLETE Result Date: 09/27/2023    ECHOCARDIOGRAM REPORT   Patient Name:   [name] Date of Exam: 09/27/2023 Medical Rec #:  [id number]         Height:       65.0 in Accession #:    [id number]        Weight:       294.8 lb Date of Birth:  1956/04/10          BSA:          2.333 m Patient Age:    68 years          BP:           166/86 mmHg Patient Gender: F                 HR:           108 bpm. Exam Location:  [facility] Procedure: 2D Echo, Cardiac Doppler, Color Doppler and Intracardiac            Opacification Agent (Both Spectral and Color Flow Doppler were             utilized during procedure). Indications:     CHF  History:         Patient has no prior history of Echocardiogram examinations.                  CHF, Signs/Symptoms:Edema; Risk Factors:Hypertension and                  Diabetes.  Sonographer:     Mikki Harbor Referring Phys:  [id number] Andris Baumann Diagnosing Phys: Debbe Odea MD  Sonographer Comments: Technically difficult study due to poor echo windows and patient is obese. IMPRESSIONS  1. Left ventricular ejection fraction, by estimation, is 60 to 65%. The left ventricle has normal function. The left ventricle has no regional wall motion abnormalities. There is mild left ventricular hypertrophy. Left ventricular diastolic parameters are consistent with Grade I diastolic dysfunction (impaired relaxation).  2. Right ventricular systolic function is normal. The right ventricular size is normal.  3. The mitral valve is degenerative. Mild mitral valve regurgitation.  4. The aortic valve was not well visualized. Aortic valve regurgitation is not visualized. Aortic valve sclerosis/calcification is present, without any evidence of aortic stenosis. Aortic valve mean gradient measures 6.7 mmHg. FINDINGS  Left Ventricle: Left ventricular ejection fraction, by estimation, is 60 to 65%. The left ventricle has normal function. The left ventricle has no regional wall motion abnormalities. Definity contrast agent was given IV to delineate the left ventricular  endocardial borders. Strain was performed and the global longitudinal strain is indeterminate. The left ventricular internal cavity size was normal in size. There is mild left ventricular hypertrophy. Left ventricular diastolic parameters are consistent  with Grade I diastolic dysfunction (impaired relaxation). Right Ventricle: The right ventricular size is normal. No increase in right ventricular wall thickness. Right ventricular systolic function is normal. Left Atrium: Left atrial size  was normal in size.  Right Atrium: Right atrial size was normal in size. Pericardium: There is no evidence of pericardial effusion. Mitral Valve: The mitral valve is degenerative in appearance. Mild mitral valve regurgitation. MV peak gradient, 13.2 mmHg. The mean mitral valve gradient is 8.0 mmHg. Tricuspid Valve: The tricuspid valve is normal in structure. Tricuspid valve regurgitation is not demonstrated. Aortic Valve: The aortic valve was not well visualized. Aortic valve regurgitation is not visualized. Aortic valve sclerosis/calcification is present, without any evidence of aortic stenosis. Aortic valve mean gradient measures 6.7 mmHg. Aortic valve peak gradient measures 12.3 mmHg. Aortic valve area, by VTI measures 2.76 cm. Pulmonic Valve: The pulmonic valve was normal in structure. Pulmonic valve regurgitation is not visualized. Aorta: The aortic root is normal in size and structure. IAS/Shunts: No atrial level shunt detected by color flow Doppler. Additional Comments: 3D was performed not requiring image post processing on an independent workstation and was indeterminate.  LEFT VENTRICLE PLAX 2D LVIDd:         4.40 cm   Diastology LVIDs:         1.90 cm   LV e' medial:    7.40 cm/s LV PW:         1.30 cm   LV E/e' medial:  16.8 LV IVS:        1.40 cm   LV e' lateral:   9.25 cm/s LVOT diam:     2.00 cm   LV E/e' lateral: 13.4 LV SV:         97 LV SV Index:   42 LVOT Area:     3.14 cm  RIGHT VENTRICLE RV Basal diam:  3.25 cm RV Mid diam:    2.60 cm RV S prime:     15.00 cm/s LEFT ATRIUM             Index        RIGHT ATRIUM           Index LA diam:        3.50 cm 1.50 cm/m   RA Area:     12.90 cm LA Vol (A2C):   56.1 ml 24.05 ml/m  RA Volume:   32.60 ml  13.98 ml/m LA Vol (A4C):   46.7 ml 20.02 ml/m LA Biplane Vol: 53.5 ml 22.94 ml/m  AORTIC VALVE                     PULMONIC VALVE AV Area (Vmax):    2.61 cm      PV Vmax:       1.43 m/s AV Area (Vmean):   2.66 cm      PV Peak grad:  8.2 mmHg AV Area (VTI):     2.76 cm  AV Vmax:           175.67 cm/s AV Vmean:          120.667 cm/s AV VTI:            0.352 m AV Peak Grad:      12.3 mmHg AV Mean Grad:      6.7 mmHg LVOT Vmax:         146.00 cm/s LVOT Vmean:        102.000 cm/s LVOT VTI:          0.310 m LVOT/AV VTI ratio: 0.88  AORTA Ao Root diam: 3.30 cm Ao Asc diam:  3.40 cm MITRAL VALVE MV Area (PHT): 3.37 cm  SHUNTS MV Area VTI:   2.37 cm     Systemic VTI:  0.31 m MV Peak grad:  13.2 mmHg    Systemic Diam: 2.00 cm MV Mean grad:  8.0 mmHg MV Vmax:       1.82 m/s MV Vmean:      136.0 cm/s MV Decel Time: 225 msec MV E velocity: 124.00 cm/s MV A velocity: 148.00 cm/s MV E/A ratio:  0.84 Debbe Odea MD Electronically signed by Debbe Odea MD Signature Date/Time: 09/27/2023/12:52:04 PM    Final    MR BRAIN WO CONTRAST Result Date: 09/27/2023 CLINICAL DATA:  68 year old female with altered mental status. EXAM: MRI HEAD WITHOUT CONTRAST TECHNIQUE: Multiplanar, multiecho pulse sequences of the brain and surrounding structures were obtained without intravenous contrast. COMPARISON:  Head CT 09/24/2023 and earlier. FINDINGS: Brain: No restricted diffusion to suggest acute infarction. No midline shift, mass effect, evidence of mass lesion, ventriculomegaly, extra-axial collection or acute intracranial hemorrhage. Cervicomedullary junction and pituitary are within normal limits. Cerebral volume is within normal limits for age. Mild for age scattered white matter T2 and FLAIR hyperintensity primarily in the corona radiata. Similar heterogeneity in the pons, more so on the right. No cortical encephalomalacia. No chronic cerebral blood products identified on motion degraded SWI. Deep gray nuclei and cerebellum appear normal. Vascular: Major intracranial vascular flow voids are preserved. Skull and upper cervical spine: Negative. Visualized bone marrow signal is within normal limits. Sinuses/Orbits: Negative. Other: Visible internal auditory structures appear normal. Negative  visible scalp and face. IMPRESSION: 1. No acute intracranial abnormality. 2. Mild for age signal changes compatible with chronic small vessel disease. Electronically Signed   By: Odessa Fleming M.D.   On: 09/27/2023 10:14   DG Abd 1 View Result Date: 09/26/2023 CLINICAL DATA:  NG placement. EXAM: ABDOMEN - 1 VIEW COMPARISON:  None Available. FINDINGS: Feeding tube with weighted tip in the left upper abdomen likely in the region of the gastric fundus. IMPRESSION: Feeding tube with tip in the gastric fundus. Electronically Signed   By: Elgie Collard M.D.   On: 09/26/2023 19:42   DG Chest Port 1 View Result Date: 09/25/2023 CLINICAL DATA:  Sepsis EXAM: PORTABLE CHEST 1 VIEW COMPARISON:  09/24/2023 FINDINGS: Right PICC line remains in place, unchanged. Heart and mediastinal contours are within normal limits. Low lung volumes with bibasilar atelectasis and vascular congestion. No effusions. No acute bony abnormality. IMPRESSION: Low lung volumes with bibasilar atelectasis and vascular congestion. Electronically Signed   By: Charlett Nose M.D.   On: 09/25/2023 23:14   CT Head Wo Contrast Result Date: 09/24/2023 CLINICAL DATA:  Mental status change, unknown cause EXAM: CT HEAD WITHOUT CONTRAST TECHNIQUE: Contiguous axial images were obtained from the base of the skull through the vertex without intravenous contrast. RADIATION DOSE REDUCTION: This exam was performed according to the departmental dose-optimization program which includes automated exposure control, adjustment of the mA and/or kV according to patient size and/or use of iterative reconstruction technique. COMPARISON:  02/20/2012 FINDINGS: Brain: No evidence of acute infarction, hemorrhage, hydrocephalus, extra-axial collection or mass lesion/mass effect. Scattered low-density changes within the periventricular and subcortical white matter most compatible with chronic microvascular ischemic change. Mild diffuse cerebral volume loss. Vascular: Atherosclerotic  calcifications involving the large vessels of the skull base. No unexpected hyperdense vessel. Skull: Normal. Negative for fracture or focal lesion. Sinuses/Orbits: No acute finding. Other: None. IMPRESSION: 1. No acute intracranial findings. 2. Chronic microvascular ischemic change and cerebral volume loss. Electronically Signed   By:  Nicholas  Plundo D.O.   On: 09/24/2023 13:55   DG Chest Port 1 View Result Date: 09/24/2023 CLINICAL DATA:  Altered mental status. EXAM: PORTABLE CHEST 1 VIEW COMPARISON:  Oct 30, 2021. FINDINGS: The heart size and mediastinal contours are within normal limits. Hypoinflation of the lungs is noted with minimal bibasilar subsegmental atelectasis. Right-sided PICC line is noted with tip in expected position of the SVC. The visualized skeletal structures are unremarkable. IMPRESSION: Hypoinflation of the lungs with minimal bibasilar subsegmental atelectasis. Electronically Signed   By: Lupita Raider M.D.   On: 09/24/2023 12:32   [name] EKG SITE RITE Result Date: 09/12/2023 If Site Rite image not attached, placement could not be confirmed due to current cardiac rhythm.  MR TIBIA FIBULA LEFT W WO CONTRAST Result Date: 09/09/2023 CLINICAL DATA:  Soft tissue infection suspected. Pain. Multiple ulcerations of the bilateral lower extremities. Bilateral extremities are bandaged currently but recent pictures of her lower extremities suggest the wounds do not appear infected but are likely related to chronic venous insufficiency. EXAM: MRI OF LOWER LEFT EXTREMITY WITHOUT AND WITH CONTRAST TECHNIQUE: Multiplanar, multisequence MR imaging of the left calf was performed both before and after administration of intravenous contrast. CONTRAST:  10mL GADAVIST GADOBUTROL 1 MMOL/ML IV SOLN COMPARISON:  CT abdomen and pelvis 04/02/2022, bilateral lower extremity Doppler venous ultrasound 09/06/2023 FINDINGS: Bones/Joint/Cartilage There is moderate to high-grade cortical erosion within the lateral  fibular diaphysis throughout the slightly proximal diaphysis through the distal diaphysis along an approximate 11 cm length in the region of the high-grade lateral calf ulcer. There is high-grade marrow edema and enhancement of the fibular diaphysis in these regions. Moderate periosteal edema and enhancement. This is indicative of acute osteomyelitis. Normal marrow signal and preserved cortex of the visualized tibia. Ligaments Edema tracking posterior to tibiofibular interosseous membrane. Muscles and Tendons There is edema tracking along the posterior aspect of the tibiofibular interosseous membrane posterior tibial musculature. Mild edema within the proximal medial aspect of the tibialis anterior muscle. Soft tissues There is concavity of the lateral left calf soft tissues with ulceration that extends to the lateral fibula at the slightly proximal aspect of the fibular diaphysis through the distal fibular diaphysis along an approximate 13 cm craniocaudal length (axial images 20 through 40). There is diffuse moderate skin thickening with waviness and irregularity throughout all portions of the calf. No walled-off abscess is seen. IMPRESSION: 1. High-grade lateral left calf soft tissue ulceration that extends to the lateral fibula at the slightly proximal aspect of the fibular diaphysis through the distal fibular diaphysis along an approximate 13 cm craniocaudal length. 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 2. Diffuse moderate skin thickening with skin waviness and irregularity throughout all portions of the calf. 3. No walled-off abscess is seen. Electronically Signed   By: Neita Garnet M.D.   On: 09/09/2023 10:24   US ARTERIAL LOWER EXTREMITY DUPLEX BILATERAL Result Date: 09/08/2023 CLINICAL DATA:  Bilateral lower extremity pain EXAM: BILATERAL LOWER EXTREMITY ARTERIAL DUPLEX SCAN TECHNIQUE: Gray-scale sonography as well as color Doppler and duplex ultrasound was  performed to evaluate the arteries of both lower extremities including the common, superficial and profunda femoral arteries, popliteal artery and calf arteries. COMPARISON:  None Available. FINDINGS: Right Lower Extremity Inflow: Normal common femoral arterial waveforms and velocities. No evidence of inflow (aortoiliac) disease. Outflow: Normal profunda femoral, superficial femoral and popliteal arterial waveforms and velocities. No focal elevation of the PSV to suggest stenosis. Runoff: Normal  posterior and anterior tibial arterial waveforms and velocities. Vessels are patent to the ankle. Left Lower Extremity Inflow: Normal common femoral arterial waveforms and velocities. No evidence of inflow (aortoiliac) disease. Outflow: Normal profunda femoral, superficial femoral and popliteal arterial waveforms and velocities. No focal elevation of the PSV to suggest stenosis. Runoff: Normal posterior and anterior tibial arterial waveforms and velocities. Vessels are patent to the ankle. IMPRESSION: No evidence of hemodynamically significant arterial stenosis or occlusion in either lower extremity. Electronically Signed   By: Malachy Moan M.D.   On: 09/08/2023 15:32   DG Tibia/Fibula Left Port Result Date: 09/08/2023 CLINICAL DATA:  Left lower extremity ulcerations. Bilateral lower extremity pain. EXAM: PORTABLE LEFT TIBIA AND FIBULA - 2 VIEW COMPARISON:  None Available. FINDINGS: No definite fracture or dislocation is noted. Vascular calcifications are noted. No lytic destruction is noted. IMPRESSION: No acute abnormality seen involving the left tibia or fibula. Electronically Signed   By: Lupita Raider M.D.   On: 09/08/2023 12:47   US Venous Img Lower Bilateral (DVT) Result Date: 09/06/2023 CLINICAL DATA:  Bilateral leg pain EXAM: BILATERAL LOWER EXTREMITY VENOUS DOPPLER ULTRASOUND TECHNIQUE: Gray-scale sonography with compression, as well as color and duplex ultrasound, were performed to evaluate the deep  venous system(s) from the level of the common femoral vein through the popliteal and proximal calf veins. COMPARISON:  None Available. FINDINGS: VENOUS Normal compressibility of the common femoral, superficial femoral, and popliteal veins. The infrapopliteal venous vasculature is obscured by overlying bandaging. Visualized portions of profunda femoral vein and great saphenous vein unremarkable. No filling defects to suggest DVT on grayscale or color Doppler imaging. Doppler waveforms show normal direction of venous flow, normal respiratory plasticity and response to augmentation. Limited views of the contralateral common femoral vein are unremarkable. OTHER None. Limitations: none IMPRESSION: 1. No evidence of femoropopliteal DVT within either lower extremity. Electronically Signed   By: Helyn Numbers M.D.   On: 09/06/2023 20:05   (Echo, Carotid, EGD, Colonoscopy, ERCP)    Subjective: pt c/o fatigue    Discharge Exam: Vitals:   10/02/23 0353 10/02/23 0843  BP: (!) 165/71 (!) 163/66  Pulse: 85   Resp: (!) 21 16  Temp: 98.4 F (36.9 C) (!) 97.4 F (36.3 C)  SpO2: 95%    Vitals:   10/01/23 1950 10/01/23 2331 10/02/23 0353 10/02/23 0843  BP: (!) 162/72  (!) 165/71 (!) 163/66  Pulse: 91 89 85   Resp: 18 16 (!) 21 16  Temp: 99.1 F (37.3 C) 98.1 F (36.7 C) 98.4 F (36.9 C) (!) 97.4 F (36.3 C)  TempSrc: Oral Oral  Oral  SpO2: 95% 95% 95%   Weight:      Height:        General: Pt is alert, awake, not in acute distress Cardiovascular: S1/S2 +, no rubs, no gallops Respiratory: decreased breath sounds b/l  Abdominal: Soft, NT, obese, bowel sounds + Extremities: no cyanosis    The results of significant diagnostics from this hospitalization (including imaging, microbiology, ancillary and laboratory) are listed below for reference.     Microbiology: Recent Results (from the past 240 hours)  Resp panel by RT-PCR (RSV, Flu A&B, Covid) Anterior Nasal Swab     Status: None    Collection Time: 09/24/23 12:21 PM   Specimen: Anterior Nasal Swab  Result Value Ref Range Status   SARS Coronavirus 2 by RT PCR NEGATIVE NEGATIVE Final    Comment: (NOTE) SARS-CoV-2 target nucleic acids are NOT DETECTED.  The SARS-CoV-2 RNA is generally detectable in upper respiratory specimens during the acute phase of infection. The lowest concentration of SARS-CoV-2 viral copies this assay can detect is 138 copies/mL. A negative result does not preclude SARS-Cov-2 infection and should not be used as the sole basis for treatment or other patient management decisions. A negative result may occur with  improper specimen collection/handling, submission of specimen other than nasopharyngeal swab, presence of viral mutation(s) within the areas targeted by this assay, and inadequate number of viral copies(<138 copies/mL). A negative result must be combined with clinical observations, patient history, and epidemiological information. The expected result is Negative.  Fact Sheet for Patients:  BloggerCourse.com  Fact Sheet for Healthcare Providers:  SeriousBroker.it  This test is no t yet approved or cleared by the [place] FDA and  has been authorized for detection and/or diagnosis of SARS-CoV-2 by FDA under an Emergency Use Authorization (EUA). This EUA will remain  in effect (meaning this test can be used) for the duration of the COVID-19 declaration under Section 564(b)(1) of the Act, 21 U.S.C.section 360bbb-3(b)(1), unless the authorization is terminated  or revoked sooner.       Influenza A by PCR NEGATIVE NEGATIVE Final   Influenza B by PCR NEGATIVE NEGATIVE Final    Comment: (NOTE) The Xpert Xpress SARS-CoV-2/FLU/RSV plus assay is intended as an aid in the diagnosis of influenza from Nasopharyngeal swab specimens and should not be used as a sole basis for treatment. Nasal washings and aspirates are unacceptable for  Xpert Xpress SARS-CoV-2/FLU/RSV testing.  Fact Sheet for Patients: BloggerCourse.com  Fact Sheet for Healthcare Providers: SeriousBroker.it  This test is not yet approved or cleared by the [place] FDA and has been authorized for detection and/or diagnosis of SARS-CoV-2 by FDA under an Emergency Use Authorization (EUA). This EUA will remain in effect (meaning this test can be used) for the duration of the COVID-19 declaration under Section 564(b)(1) of the Act, 21 U.S.C. section 360bbb-3(b)(1), unless the authorization is terminated or revoked.     Resp Syncytial Virus by PCR NEGATIVE NEGATIVE Final    Comment: (NOTE) Fact Sheet for Patients: BloggerCourse.com  Fact Sheet for Healthcare Providers: SeriousBroker.it  This test is not yet approved or cleared by the [place] FDA and has been authorized for detection and/or diagnosis of SARS-CoV-2 by FDA under an Emergency Use Authorization (EUA). This EUA will remain in effect (meaning this test can be used) for the duration of the COVID-19 declaration under Section 564(b)(1) of the Act, 21 U.S.C. section 360bbb-3(b)(1), unless the authorization is terminated or revoked.  Performed at [facility], [address], [place], [place] [address]   Urine Culture     Status: None   Collection Time: 09/24/23 12:21 PM   Specimen: Urine, Random  Result Value Ref Range Status   Specimen Description   Final    URINE, RANDOM Performed at [facility], [address], [place], [place] [address]    Special Requests   Final    NONE Reflexed from [id number] Performed at [facility], [address], [place], [place] [address]    Culture   Final    NO GROWTH Performed at [facility], [address], [place], [place] [address]    Report Status 09/25/2023 FINAL  Final  Blood Culture (routine x 2)      Status: None   Collection Time: 09/24/23 12:22 PM   Specimen: BLOOD LEFT FOREARM  Result Value Ref Range  Status   Specimen Description BLOOD LEFT FOREARM  Final   Special Requests   Final    BOTTLES DRAWN AEROBIC ONLY Blood Culture results may not be optimal due to an inadequate volume of blood received in culture bottles   Culture   Final    NO GROWTH 5 DAYS Performed at [facility], [address], [place], [place] [address]    Report Status 09/29/2023 FINAL  Final  Blood Culture (routine x 2)     Status: None   Collection Time: 09/24/23 12:23 PM   Specimen: Left Antecubital; Blood  Result Value Ref Range Status   Specimen Description LEFT ANTECUBITAL  Final   Special Requests   Final    BOTTLES DRAWN AEROBIC AND ANAEROBIC Blood Culture adequate volume   Culture   Final    NO GROWTH 5 DAYS Performed at [facility], [address] [place], [place] [address]    Report Status 09/29/2023 FINAL  Final  MRSA Next Gen by PCR, Nasal     Status: None   Collection Time: 09/25/23  8:44 PM   Specimen: Nasal Mucosa; Nasal Swab  Result Value Ref Range Status   MRSA by PCR Next Gen NOT DETECTED NOT DETECTED Final    Comment: (NOTE) The GeneXpert MRSA Assay (FDA approved for NASAL specimens only), is one component of a comprehensive MRSA colonization surveillance program. It is not intended to diagnose MRSA infection nor to guide or monitor treatment for MRSA infections. Test performance is not FDA approved in patients less than 87 years old. Performed at [facility], [address] [place], [place] [address]      Labs: BNP (last 3 results) Recent Labs    09/04/23 1403 09/25/23 2328  BNP 83.9 120.5*   Basic Metabolic Panel: Recent Labs  Lab 09/25/23 1351 09/25/23 2328 09/26/23 0125 09/27/23 0151 09/28/23 0435 09/29/23 0549 09/30/23 0452 10/01/23 0715 10/02/23 0635  NA 140   < > 141 140 145 144 146* 145 145  K 4.9   < > 4.4 4.6 4.4 4.0  3.6 3.3* 3.5  CL 107   < > 110 106 111 111 113* 113* 113*  CO2 17*   < > 19* 19* 22 23 21* 22 22  GLUCOSE 264*   < > 170* 171* 85 197* 100* 89 73  BUN 45*   < > 45* 50* 50* 51* 47* 45* 41*  CREATININE 3.23*   < > 3.33* 3.81* 4.02* 3.89* 3.49* 3.39* 3.07*  CALCIUM 8.7*   < > 8.4* 8.5* 8.2* 8.4* 8.5* 8.2* 8.1*  MG 2.0  --  2.0 2.2 2.1  --   --   --   --   PHOS 5.9*  --  5.6* 5.7* 5.7*  --   --   --   --    < > = values in this interval not displayed.   Liver Function Tests: Recent Labs  Lab 09/25/23 2328  AST 19  ALT 11  ALKPHOS 62  BILITOT 0.7  PROT 6.6  ALBUMIN 2.2*   No results for input(s): "LIPASE", "AMYLASE" in the last 168 hours. Recent Labs  Lab 09/27/23 0859  AMMONIA 16   CBC: Recent Labs  Lab 09/25/23 2328 09/26/23 0125 09/28/23 0435 09/29/23 0549 09/30/23 0452 10/01/23 0715 10/02/23 0635  WBC 12.6*   < > 9.6  9.8 8.9 8.0 7.1 6.7  NEUTROABS 10.3*  --  7.4  --   --   --   --  HGB 9.3*   < > 9.6*  9.5* 9.6* 10.8* 10.7* 9.7*  HCT 30.5*   < > 32.2*  31.0* 31.2* 35.6* 35.1* 31.2*  MCV 90.2   < > 92.8  89.3 90.2 90.8 90.9 90.4  PLT 361   < > 317  317 309 333 292 287   < > = values in this interval not displayed.   Cardiac Enzymes: Recent Labs  Lab 09/28/23 0533  CKTOTAL 53   BNP: Invalid input(s): "POCBNP" CBG: Recent Labs  Lab 10/01/23 1946 10/02/23 0031 10/02/23 0357 10/02/23 0838 10/02/23 1228  GLUCAP 106* 74 70 69* 94   D-Dimer No results for input(s): "DDIMER" in the last 72 hours. Hgb A1c No results for input(s): "HGBA1C" in the last 72 hours. Lipid Profile No results for input(s): "CHOL", "HDL", "LDLCALC", "TRIG", "CHOLHDL", "LDLDIRECT" in the last 72 hours. Thyroid function studies No results for input(s): "TSH", "T4TOTAL", "T3FREE", "THYROIDAB" in the last 72 hours.  Invalid input(s): "FREET3" Anemia work up No results for input(s): "VITAMINB12", "FOLATE", "FERRITIN", "TIBC", "IRON", "RETICCTPCT" in the last 72  hours. Urinalysis    Component Value Date/Time   COLORURINE YELLOW (A) 09/24/2023 1221   APPEARANCEUR CLOUDY (A) 09/24/2023 1221   LABSPEC 1.023 09/24/2023 1221   PHURINE 5.0 09/24/2023 1221   GLUCOSEU NEGATIVE 09/24/2023 1221   HGBUR SMALL (A) 09/24/2023 1221   BILIRUBINUR NEGATIVE 09/24/2023 1221   BILIRUBINUR negative 12/22/2014 0846   KETONESUR 5 (A) 09/24/2023 1221   PROTEINUR 100 (A) 09/24/2023 1221   UROBILINOGEN 0.2 12/22/2014 0846   NITRITE NEGATIVE 09/24/2023 1221   LEUKOCYTESUR NEGATIVE 09/24/2023 1221   Sepsis Labs Recent Labs  Lab 09/29/23 0549 09/30/23 0452 10/01/23 0715 10/02/23 0635  WBC 8.9 8.0 7.1 6.7   Microbiology Recent Results (from the past 240 hours)  Resp panel by RT-PCR (RSV, Flu A&B, Covid) Anterior Nasal Swab     Status: None   Collection Time: 09/24/23 12:21 PM   Specimen: Anterior Nasal Swab  Result Value Ref Range Status   SARS Coronavirus 2 by RT PCR NEGATIVE NEGATIVE Final    Comment: (NOTE) SARS-CoV-2 target nucleic acids are NOT DETECTED.  The SARS-CoV-2 RNA is generally detectable in upper respiratory specimens during the acute phase of infection. The lowest concentration of SARS-CoV-2 viral copies this assay can detect is 138 copies/mL. A negative result does not preclude SARS-Cov-2 infection and should not be used as the sole basis for treatment or other patient management decisions. A negative result may occur with  improper specimen collection/handling, submission of specimen other than nasopharyngeal swab, presence of viral mutation(s) within the areas targeted by this assay, and inadequate number of viral copies(<138 copies/mL). A negative result must be combined with clinical observations, patient history, and epidemiological information. The expected result is Negative.  Fact Sheet for Patients:  BloggerCourse.com  Fact Sheet for Healthcare Providers:   SeriousBroker.it  This test is no t yet approved or cleared by the [place] FDA and  has been authorized for detection and/or diagnosis of SARS-CoV-2 by FDA under an Emergency Use Authorization (EUA). This EUA will remain  in effect (meaning this test can be used) for the duration of the COVID-19 declaration under Section 564(b)(1) of the Act, 21 U.S.C.section 360bbb-3(b)(1), unless the authorization is terminated  or revoked sooner.       Influenza A by PCR NEGATIVE NEGATIVE Final   Influenza B by PCR NEGATIVE NEGATIVE Final    Comment: (NOTE) The Xpert Xpress SARS-CoV-2/FLU/RSV plus assay  is intended as an aid in the diagnosis of influenza from Nasopharyngeal swab specimens and should not be used as a sole basis for treatment. Nasal washings and aspirates are unacceptable for Xpert Xpress SARS-CoV-2/FLU/RSV testing.  Fact Sheet for Patients: BloggerCourse.com  Fact Sheet for Healthcare Providers: SeriousBroker.it  This test is not yet approved or cleared by the [place] FDA and has been authorized for detection and/or diagnosis of SARS-CoV-2 by FDA under an Emergency Use Authorization (EUA). This EUA will remain in effect (meaning this test can be used) for the duration of the COVID-19 declaration under Section 564(b)(1) of the Act, 21 U.S.C. section 360bbb-3(b)(1), unless the authorization is terminated or revoked.     Resp Syncytial Virus by PCR NEGATIVE NEGATIVE Final    Comment: (NOTE) Fact Sheet for Patients: BloggerCourse.com  Fact Sheet for Healthcare Providers: SeriousBroker.it  This test is not yet approved or cleared by the [place] FDA and has been authorized for detection and/or diagnosis of SARS-CoV-2 by FDA under an Emergency Use Authorization (EUA). This EUA will remain in effect (meaning this test can be used) for  the duration of the COVID-19 declaration under Section 564(b)(1) of the Act, 21 U.S.C. section 360bbb-3(b)(1), unless the authorization is terminated or revoked.  Performed at [facility], [address], [place], [place] [address]   Urine Culture     Status: None   Collection Time: 09/24/23 12:21 PM   Specimen: Urine, Random  Result Value Ref Range Status   Specimen Description   Final    URINE, RANDOM Performed at [facility], [address], [place], [place] [address]    Special Requests   Final    NONE Reflexed from [id number] Performed at [facility], [address] [place], [place] [address]    Culture   Final    NO GROWTH Performed at [facility], [address], [place], [place] [address]    Report Status 09/25/2023 FINAL  Final  Blood Culture (routine x 2)     Status: None   Collection Time: 09/24/23 12:22 PM   Specimen: BLOOD LEFT FOREARM  Result Value Ref Range Status   Specimen Description BLOOD LEFT FOREARM  Final   Special Requests   Final    BOTTLES DRAWN AEROBIC ONLY Blood Culture results may not be optimal due to an inadequate volume of blood received in culture bottles   Culture   Final    NO GROWTH 5 DAYS Performed at [facility], [address], [place], [place] [address]    Report Status 09/29/2023 FINAL  Final  Blood Culture (routine x 2)     Status: None   Collection Time: 09/24/23 12:23 PM   Specimen: Left Antecubital; Blood  Result Value Ref Range Status   Specimen Description LEFT ANTECUBITAL  Final   Special Requests   Final    BOTTLES DRAWN AEROBIC AND ANAEROBIC Blood Culture adequate volume   Culture   Final    NO GROWTH 5 DAYS Performed at [facility], [address], [place], [place] [address]    Report Status 09/29/2023 FINAL  Final  MRSA Next Gen by PCR, Nasal     Status: None   Collection Time: 09/25/23  8:44 PM   Specimen: Nasal Mucosa; Nasal Swab  Result Value Ref Range  Status   MRSA by PCR Next Gen NOT DETECTED NOT DETECTED Final    Comment: (NOTE) The GeneXpert MRSA Assay (FDA approved for NASAL specimens  only), is one component of a comprehensive MRSA colonization surveillance program. It is not intended to diagnose MRSA infection nor to guide or monitor treatment for MRSA infections. Test performance is not FDA approved in patients less than 74 years old. Performed at [facility], [address] [place], [place] [address]      Time coordinating discharge: Over 30 minutes  SIGNED:   Charise Killian, MD  [facility] 10/02/2023, 1:28 PM Pager   If 7PM-7AM, please contact night-coverage [email or web address]

## 2023-10-02 NOTE — Progress Notes (Signed)
 Speech Language Pathology Treatment: Dysphagia  Patient Details Name: [name] MRN: [id number] DOB: Jun 21, 1956 Today's Date: 10/02/2023 Time: 1100-1120 SLP Time Calculation (min) (ACUTE ONLY): 20 min  Assessment / Plan / Recommendation Clinical Impression  Pt seen for dysphagia intervention. Pt alert and agreeable to session, eager for trials of advanced textures. Pt with min congested cough at baseline, with intermittent presentation, not timed with swallow function. Pt seen with trials of thin liquids (cup and straw), puree, and regular solids. Pt requiring set up, but no assistance for completion of trials. No overt or subtle s/sx pharyngeal dysphagia noted. No change to vocal quality across trials. Vitals stable for duration of trials (greater than 95 O2 saturations on room air). Complete oral manipulation and clearance of regular solids with min increased time. Pt verbally endorsed understanding for aspiration precautions and rationale for implementation.   Recommend advancement to regular solids and thin liquids with aspiration precautions, including slow rate small bites, elevated HOB, and alert for PO intake. Intermittent supervision. MD and RN aware of recommendations. No further acute services indicated- recommend follow up at next level of care for dysphagia and cognitive communication as warranted.    HPI HPI: 68 y.o. 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  She presents from SNF with altered mental status and not eating and drinking for a few days on 09/24/2023. Pt had NG placed d/t inability to consume PO d/t AMS. ID was consulted and suspected cefepime toxicity  (currently held). MRI on 09/27/2023 revealed No acute intracranial abnormality.  Mild for age signal changes compatible with chronic small vessel disease. CT Abdomen on 09/27/2023 revealed Patchy airspace consolidation throughout the lung fields most likely due to multifocal pneumonia. 2. No bowel obstruction or inflammation. 3. Trace bilateral pleural effusions.  Trace presacral ascites.      SLP Plan  All goals met (no futher acute needs)      Recommendations for follow up therapy are one component of a multi-disciplinary discharge planning process, led by the attending physician.  Recommendations may be updated based on patient status, additional functional criteria and insurance authorization.    Recommendations  Diet recommendations: Regular;Thin liquid Liquids provided via: Cup;Straw Medication Administration: Whole meds with puree Supervision: Intermittent supervision to cue for compensatory strategies Compensations: Minimize environmental distractions;Slow rate;Small sips/bites Postural Changes and/or Swallow Maneuvers: Seated upright 90 degrees                  Oral care BID   Intermittent Supervision/Assistance Dysphagia, oropharyngeal phase (R13.12);Cognitive communication deficit (R41.841)     All goals met (no futher acute needs)    [place] Rathana Viveros Clapp, MS, CCC-SLP Speech Language Pathologist Rehab Services; [facility] [phone] (ascom)   [place] J Clapp  10/02/2023, 12:10 PM

## 2023-10-03 DIAGNOSIS — I87311 Chronic venous hypertension (idiopathic) with ulcer of right lower extremity: Secondary | ICD-10-CM | POA: Diagnosis not present

## 2023-10-03 DIAGNOSIS — E1169 Type 2 diabetes mellitus with other specified complication: Secondary | ICD-10-CM | POA: Diagnosis not present

## 2023-10-03 DIAGNOSIS — L89616 Pressure-induced deep tissue damage of right heel: Secondary | ICD-10-CM | POA: Diagnosis not present

## 2023-10-03 DIAGNOSIS — I739 Peripheral vascular disease, unspecified: Secondary | ICD-10-CM | POA: Diagnosis not present

## 2023-10-04 ENCOUNTER — Other Ambulatory Visit (INDEPENDENT_AMBULATORY_CARE_PROVIDER_SITE_OTHER): Payer: Self-pay | Admitting: Nurse Practitioner

## 2023-10-04 DIAGNOSIS — E039 Hypothyroidism, unspecified: Secondary | ICD-10-CM | POA: Diagnosis not present

## 2023-10-04 DIAGNOSIS — I739 Peripheral vascular disease, unspecified: Secondary | ICD-10-CM | POA: Diagnosis not present

## 2023-10-04 DIAGNOSIS — G9340 Encephalopathy, unspecified: Secondary | ICD-10-CM | POA: Diagnosis not present

## 2023-10-04 DIAGNOSIS — M869 Osteomyelitis, unspecified: Secondary | ICD-10-CM | POA: Diagnosis not present

## 2023-10-04 DIAGNOSIS — N189 Chronic kidney disease, unspecified: Secondary | ICD-10-CM | POA: Diagnosis not present

## 2023-10-04 DIAGNOSIS — N179 Acute kidney failure, unspecified: Secondary | ICD-10-CM | POA: Diagnosis not present

## 2023-10-04 DIAGNOSIS — I872 Venous insufficiency (chronic) (peripheral): Secondary | ICD-10-CM

## 2023-10-04 DIAGNOSIS — A419 Sepsis, unspecified organism: Secondary | ICD-10-CM | POA: Diagnosis not present

## 2023-10-04 DIAGNOSIS — I10 Essential (primary) hypertension: Secondary | ICD-10-CM | POA: Diagnosis not present

## 2023-10-05 DIAGNOSIS — R051 Acute cough: Secondary | ICD-10-CM | POA: Diagnosis not present

## 2023-10-09 ENCOUNTER — Encounter (INDEPENDENT_AMBULATORY_CARE_PROVIDER_SITE_OTHER)

## 2023-10-09 ENCOUNTER — Encounter (INDEPENDENT_AMBULATORY_CARE_PROVIDER_SITE_OTHER): Admitting: Nurse Practitioner

## 2023-10-09 ENCOUNTER — Telehealth: Payer: Self-pay

## 2023-10-09 DIAGNOSIS — M869 Osteomyelitis, unspecified: Secondary | ICD-10-CM | POA: Diagnosis not present

## 2023-10-09 DIAGNOSIS — I739 Peripheral vascular disease, unspecified: Secondary | ICD-10-CM | POA: Diagnosis not present

## 2023-10-09 DIAGNOSIS — J309 Allergic rhinitis, unspecified: Secondary | ICD-10-CM | POA: Diagnosis not present

## 2023-10-09 NOTE — Telephone Encounter (Signed)
 RN with [facility] - RN wanted clarification if patient was to stop IV Daptomycin and IV cefepime. Per hospital discharge summary and Dr.Ravishankar note - patient is to STOP IV daptomycin and IV cefepime. RN stated that patient has been off IV antibiotics.    [name] [name], CMA

## 2023-10-10 ENCOUNTER — Ambulatory Visit: Attending: Infectious Diseases | Admitting: Infectious Diseases

## 2023-10-10 DIAGNOSIS — L97919 Non-pressure chronic ulcer of unspecified part of right lower leg with unspecified severity: Secondary | ICD-10-CM | POA: Diagnosis not present

## 2023-10-10 DIAGNOSIS — I87312 Chronic venous hypertension (idiopathic) with ulcer of left lower extremity: Secondary | ICD-10-CM | POA: Diagnosis not present

## 2023-10-10 DIAGNOSIS — L97222 Non-pressure chronic ulcer of left calf with fat layer exposed: Secondary | ICD-10-CM | POA: Diagnosis not present

## 2023-10-10 DIAGNOSIS — I83029 Varicose veins of left lower extremity with ulcer of unspecified site: Secondary | ICD-10-CM | POA: Diagnosis not present

## 2023-10-10 DIAGNOSIS — N189 Chronic kidney disease, unspecified: Secondary | ICD-10-CM | POA: Diagnosis not present

## 2023-10-10 DIAGNOSIS — L98498 Non-pressure chronic ulcer of skin of other sites with other specified severity: Secondary | ICD-10-CM | POA: Diagnosis not present

## 2023-10-10 DIAGNOSIS — L97929 Non-pressure chronic ulcer of unspecified part of left lower leg with unspecified severity: Secondary | ICD-10-CM

## 2023-10-10 DIAGNOSIS — E1169 Type 2 diabetes mellitus with other specified complication: Secondary | ICD-10-CM | POA: Diagnosis not present

## 2023-10-10 DIAGNOSIS — E039 Hypothyroidism, unspecified: Secondary | ICD-10-CM | POA: Diagnosis not present

## 2023-10-10 DIAGNOSIS — I739 Peripheral vascular disease, unspecified: Secondary | ICD-10-CM | POA: Diagnosis not present

## 2023-10-10 DIAGNOSIS — I83019 Varicose veins of right lower extremity with ulcer of unspecified site: Secondary | ICD-10-CM | POA: Diagnosis not present

## 2023-10-10 DIAGNOSIS — E669 Obesity, unspecified: Secondary | ICD-10-CM | POA: Diagnosis not present

## 2023-10-10 DIAGNOSIS — I83209 Varicose veins of unspecified lower extremity with both ulcer of unspecified site and inflammation: Secondary | ICD-10-CM | POA: Insufficient documentation

## 2023-10-10 DIAGNOSIS — Z7401 Bed confinement status: Secondary | ICD-10-CM | POA: Diagnosis not present

## 2023-10-10 NOTE — Progress Notes (Signed)
 The purpose of this virtual visit is to provide medical care while limiting exposure to the novel coronavirus (COVID19) for both patient and office staff.   Consent was obtained for video visit:  Yes.   Answered questions that patient had about telehealth interaction:  Yes.   I discussed the limitations, risks, security and privacy concerns of performing an evaluation and management service by telephone. I also discussed with the patient that there may be a patient responsible charge related to this service. The patient expressed understanding and agreed to proceed.   Patient Location: [employer] [facility] (medical). People on the call [facility] nurse lynn Morris, Dr.Black wound care consultant, ptient and providder Follow up after recent hospitalization [name] is a 68 y.o. 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 She was also sent on Hydromorphone She presents from SNF with altered mental status and not eating and drinking for a few days. She was found to be in AKI and was encephalopathic due to AKI and also meds including opioids and cefepime- they were all disontinued and she recovered over a few days For the leg antibiotic was not deemed necessary as the bone changes due to pressure  are not amenable to antibiotics. 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 ? Recent Acute toxic encephalopathy- due to AKI on CKD and CNS effect of opioids and cefepime--  Resolved completely      AKI on CKD- back to baseline     Obesity   Bed bound status    PAD     Hypothyroidism    h/o rt THA   Total time spent on this visit 20 min

## 2023-10-18 DIAGNOSIS — F330 Major depressive disorder, recurrent, mild: Secondary | ICD-10-CM | POA: Diagnosis not present

## 2023-10-18 DIAGNOSIS — F411 Generalized anxiety disorder: Secondary | ICD-10-CM | POA: Diagnosis not present

## 2023-10-25 DIAGNOSIS — L84 Corns and callosities: Secondary | ICD-10-CM | POA: Diagnosis not present

## 2023-10-25 DIAGNOSIS — E1151 Type 2 diabetes mellitus with diabetic peripheral angiopathy without gangrene: Secondary | ICD-10-CM | POA: Diagnosis not present

## 2023-10-25 DIAGNOSIS — L97222 Non-pressure chronic ulcer of left calf with fat layer exposed: Secondary | ICD-10-CM | POA: Diagnosis not present

## 2023-10-25 DIAGNOSIS — L97212 Non-pressure chronic ulcer of right calf with fat layer exposed: Secondary | ICD-10-CM | POA: Diagnosis not present

## 2023-10-25 DIAGNOSIS — Z794 Long term (current) use of insulin: Secondary | ICD-10-CM | POA: Diagnosis not present

## 2023-10-25 DIAGNOSIS — L602 Onychogryphosis: Secondary | ICD-10-CM | POA: Diagnosis not present

## 2023-10-27 DIAGNOSIS — E119 Type 2 diabetes mellitus without complications: Secondary | ICD-10-CM | POA: Diagnosis not present

## 2023-11-01 DIAGNOSIS — F330 Major depressive disorder, recurrent, mild: Secondary | ICD-10-CM | POA: Diagnosis not present

## 2023-11-02 DIAGNOSIS — L299 Pruritus, unspecified: Secondary | ICD-10-CM | POA: Diagnosis not present

## 2023-11-02 DIAGNOSIS — D509 Iron deficiency anemia, unspecified: Secondary | ICD-10-CM | POA: Diagnosis not present

## 2023-11-02 DIAGNOSIS — T148XXA Other injury of unspecified body region, initial encounter: Secondary | ICD-10-CM | POA: Diagnosis not present

## 2023-11-02 DIAGNOSIS — E559 Vitamin D deficiency, unspecified: Secondary | ICD-10-CM | POA: Diagnosis not present

## 2023-11-03 DIAGNOSIS — I10 Essential (primary) hypertension: Secondary | ICD-10-CM | POA: Diagnosis not present

## 2023-11-03 DIAGNOSIS — E119 Type 2 diabetes mellitus without complications: Secondary | ICD-10-CM | POA: Diagnosis not present

## 2023-11-06 DIAGNOSIS — N1832 Chronic kidney disease, stage 3b: Secondary | ICD-10-CM | POA: Diagnosis not present

## 2023-11-06 DIAGNOSIS — D509 Iron deficiency anemia, unspecified: Secondary | ICD-10-CM | POA: Diagnosis not present

## 2023-11-06 DIAGNOSIS — E039 Hypothyroidism, unspecified: Secondary | ICD-10-CM | POA: Diagnosis not present

## 2023-11-06 DIAGNOSIS — E119 Type 2 diabetes mellitus without complications: Secondary | ICD-10-CM | POA: Diagnosis not present

## 2023-11-22 DIAGNOSIS — F330 Major depressive disorder, recurrent, mild: Secondary | ICD-10-CM | POA: Diagnosis not present

## 2023-11-22 DIAGNOSIS — L97222 Non-pressure chronic ulcer of left calf with fat layer exposed: Secondary | ICD-10-CM | POA: Diagnosis not present

## 2023-11-22 DIAGNOSIS — L97212 Non-pressure chronic ulcer of right calf with fat layer exposed: Secondary | ICD-10-CM | POA: Diagnosis not present

## 2023-11-23 DIAGNOSIS — L03116 Cellulitis of left lower limb: Secondary | ICD-10-CM | POA: Diagnosis not present

## 2023-11-23 DIAGNOSIS — B372 Candidiasis of skin and nail: Secondary | ICD-10-CM | POA: Diagnosis not present

## 2023-11-27 DIAGNOSIS — B372 Candidiasis of skin and nail: Secondary | ICD-10-CM | POA: Diagnosis not present

## 2023-11-27 DIAGNOSIS — L03116 Cellulitis of left lower limb: Secondary | ICD-10-CM | POA: Diagnosis not present

## 2023-11-29 DIAGNOSIS — D509 Iron deficiency anemia, unspecified: Secondary | ICD-10-CM | POA: Diagnosis not present

## 2023-11-29 DIAGNOSIS — E559 Vitamin D deficiency, unspecified: Secondary | ICD-10-CM | POA: Diagnosis not present

## 2023-11-29 DIAGNOSIS — F330 Major depressive disorder, recurrent, mild: Secondary | ICD-10-CM | POA: Diagnosis not present

## 2023-11-29 DIAGNOSIS — I739 Peripheral vascular disease, unspecified: Secondary | ICD-10-CM | POA: Diagnosis not present

## 2023-11-29 DIAGNOSIS — I509 Heart failure, unspecified: Secondary | ICD-10-CM | POA: Diagnosis not present

## 2023-11-29 DIAGNOSIS — I10 Essential (primary) hypertension: Secondary | ICD-10-CM | POA: Diagnosis not present

## 2023-11-29 DIAGNOSIS — T148XXA Other injury of unspecified body region, initial encounter: Secondary | ICD-10-CM | POA: Diagnosis not present

## 2023-11-29 DIAGNOSIS — E039 Hypothyroidism, unspecified: Secondary | ICD-10-CM | POA: Diagnosis not present

## 2023-11-29 DIAGNOSIS — N189 Chronic kidney disease, unspecified: Secondary | ICD-10-CM | POA: Diagnosis not present

## 2023-11-30 DIAGNOSIS — B372 Candidiasis of skin and nail: Secondary | ICD-10-CM | POA: Diagnosis not present

## 2023-11-30 DIAGNOSIS — G8929 Other chronic pain: Secondary | ICD-10-CM | POA: Diagnosis not present

## 2023-11-30 DIAGNOSIS — L03116 Cellulitis of left lower limb: Secondary | ICD-10-CM | POA: Diagnosis not present

## 2023-12-08 ENCOUNTER — Other Ambulatory Visit: Payer: Self-pay

## 2023-12-08 ENCOUNTER — Observation Stay

## 2023-12-08 ENCOUNTER — Encounter: Payer: Self-pay | Admitting: Emergency Medicine

## 2023-12-08 ENCOUNTER — Inpatient Hospital Stay
Admission: EM | Admit: 2023-12-08 | Discharge: 2023-12-15 | DRG: 603 | Disposition: A | Discharge status: Skilled Nursing Facility | Source: Skilled Nursing Facility | Attending: Internal Medicine | Admitting: Internal Medicine

## 2023-12-08 DIAGNOSIS — Z809 Family history of malignant neoplasm, unspecified: Secondary | ICD-10-CM

## 2023-12-08 DIAGNOSIS — Z833 Family history of diabetes mellitus: Secondary | ICD-10-CM

## 2023-12-08 DIAGNOSIS — N179 Acute kidney failure, unspecified: Secondary | ICD-10-CM | POA: Diagnosis not present

## 2023-12-08 DIAGNOSIS — I5022 Chronic systolic (congestive) heart failure: Secondary | ICD-10-CM | POA: Diagnosis present

## 2023-12-08 DIAGNOSIS — Z881 Allergy status to other antibiotic agents status: Secondary | ICD-10-CM

## 2023-12-08 DIAGNOSIS — L97819 Non-pressure chronic ulcer of other part of right lower leg with unspecified severity: Secondary | ICD-10-CM | POA: Diagnosis present

## 2023-12-08 DIAGNOSIS — E872 Acidosis, unspecified: Secondary | ICD-10-CM | POA: Diagnosis present

## 2023-12-08 DIAGNOSIS — Z7902 Long term (current) use of antithrombotics/antiplatelets: Secondary | ICD-10-CM

## 2023-12-08 DIAGNOSIS — Z7901 Long term (current) use of anticoagulants: Secondary | ICD-10-CM

## 2023-12-08 DIAGNOSIS — L03116 Cellulitis of left lower limb: Principal | ICD-10-CM | POA: Diagnosis present

## 2023-12-08 DIAGNOSIS — I959 Hypotension, unspecified: Secondary | ICD-10-CM | POA: Diagnosis not present

## 2023-12-08 DIAGNOSIS — I130 Hypertensive heart and chronic kidney disease with heart failure and stage 1 through stage 4 chronic kidney disease, or unspecified chronic kidney disease: Secondary | ICD-10-CM | POA: Diagnosis present

## 2023-12-08 DIAGNOSIS — M79606 Pain in leg, unspecified: Secondary | ICD-10-CM

## 2023-12-08 DIAGNOSIS — E1169 Type 2 diabetes mellitus with other specified complication: Secondary | ICD-10-CM | POA: Diagnosis present

## 2023-12-08 DIAGNOSIS — L0390 Cellulitis, unspecified: Secondary | ICD-10-CM | POA: Diagnosis not present

## 2023-12-08 DIAGNOSIS — Z89411 Acquired absence of right great toe: Secondary | ICD-10-CM

## 2023-12-08 DIAGNOSIS — I739 Peripheral vascular disease, unspecified: Secondary | ICD-10-CM | POA: Diagnosis present

## 2023-12-08 DIAGNOSIS — E1122 Type 2 diabetes mellitus with diabetic chronic kidney disease: Secondary | ICD-10-CM | POA: Diagnosis present

## 2023-12-08 DIAGNOSIS — D631 Anemia in chronic kidney disease: Secondary | ICD-10-CM | POA: Diagnosis not present

## 2023-12-08 DIAGNOSIS — Z79899 Other long term (current) drug therapy: Secondary | ICD-10-CM

## 2023-12-08 DIAGNOSIS — E875 Hyperkalemia: Secondary | ICD-10-CM | POA: Diagnosis not present

## 2023-12-08 DIAGNOSIS — N1832 Chronic kidney disease, stage 3b: Secondary | ICD-10-CM | POA: Diagnosis not present

## 2023-12-08 DIAGNOSIS — Z66 Do not resuscitate: Secondary | ICD-10-CM | POA: Diagnosis present

## 2023-12-08 DIAGNOSIS — I890 Lymphedema, not elsewhere classified: Secondary | ICD-10-CM | POA: Diagnosis present

## 2023-12-08 DIAGNOSIS — F4322 Adjustment disorder with anxiety: Secondary | ICD-10-CM | POA: Diagnosis present

## 2023-12-08 DIAGNOSIS — Z79890 Hormone replacement therapy (postmenopausal): Secondary | ICD-10-CM

## 2023-12-08 DIAGNOSIS — Z89421 Acquired absence of other right toe(s): Secondary | ICD-10-CM

## 2023-12-08 DIAGNOSIS — I87313 Chronic venous hypertension (idiopathic) with ulcer of bilateral lower extremity: Secondary | ICD-10-CM | POA: Diagnosis present

## 2023-12-08 DIAGNOSIS — E1129 Type 2 diabetes mellitus with other diabetic kidney complication: Secondary | ICD-10-CM | POA: Diagnosis present

## 2023-12-08 DIAGNOSIS — Z794 Long term (current) use of insulin: Secondary | ICD-10-CM

## 2023-12-08 DIAGNOSIS — Z743 Need for continuous supervision: Secondary | ICD-10-CM | POA: Diagnosis not present

## 2023-12-08 DIAGNOSIS — I83019 Varicose veins of right lower extremity with ulcer of unspecified site: Secondary | ICD-10-CM | POA: Diagnosis present

## 2023-12-08 DIAGNOSIS — E66813 Obesity, class 3: Secondary | ICD-10-CM | POA: Diagnosis present

## 2023-12-08 DIAGNOSIS — L97929 Non-pressure chronic ulcer of unspecified part of left lower leg with unspecified severity: Secondary | ICD-10-CM | POA: Diagnosis present

## 2023-12-08 DIAGNOSIS — L97919 Non-pressure chronic ulcer of unspecified part of right lower leg with unspecified severity: Secondary | ICD-10-CM | POA: Diagnosis present

## 2023-12-08 DIAGNOSIS — L97829 Non-pressure chronic ulcer of other part of left lower leg with unspecified severity: Secondary | ICD-10-CM | POA: Diagnosis present

## 2023-12-08 DIAGNOSIS — E785 Hyperlipidemia, unspecified: Secondary | ICD-10-CM | POA: Diagnosis present

## 2023-12-08 DIAGNOSIS — E1151 Type 2 diabetes mellitus with diabetic peripheral angiopathy without gangrene: Secondary | ICD-10-CM | POA: Diagnosis present

## 2023-12-08 DIAGNOSIS — I10 Essential (primary) hypertension: Secondary | ICD-10-CM | POA: Diagnosis present

## 2023-12-08 DIAGNOSIS — E039 Hypothyroidism, unspecified: Secondary | ICD-10-CM | POA: Diagnosis present

## 2023-12-08 DIAGNOSIS — Z96641 Presence of right artificial hip joint: Secondary | ICD-10-CM | POA: Diagnosis present

## 2023-12-08 DIAGNOSIS — D638 Anemia in other chronic diseases classified elsewhere: Secondary | ICD-10-CM | POA: Diagnosis present

## 2023-12-08 LAB — CBC
HCT: 31.7 % — ABNORMAL LOW (ref 36.0–46.0)
Hemoglobin: 9.7 g/dL — ABNORMAL LOW (ref 12.0–15.0)
MCH: 27.4 pg (ref 26.0–34.0)
MCHC: 30.6 g/dL (ref 30.0–36.0)
MCV: 89.5 fL (ref 80.0–100.0)
Platelets: 397 10*3/uL (ref 150–400)
RBC: 3.54 MIL/uL — ABNORMAL LOW (ref 3.87–5.11)
RDW: 15.9 % — ABNORMAL HIGH (ref 11.5–15.5)
WBC: 10.3 10*3/uL (ref 4.0–10.5)
nRBC: 0 % (ref 0.0–0.2)

## 2023-12-08 LAB — COMPREHENSIVE METABOLIC PANEL WITH GFR
ALT: 12 U/L (ref 0–44)
AST: 23 U/L (ref 15–41)
Albumin: 2.4 g/dL — ABNORMAL LOW (ref 3.5–5.0)
Alkaline Phosphatase: 81 U/L (ref 38–126)
Anion gap: 10 (ref 5–15)
BUN: 28 mg/dL — ABNORMAL HIGH (ref 8–23)
CO2: 20 mmol/L — ABNORMAL LOW (ref 22–32)
Calcium: 9 mg/dL (ref 8.9–10.3)
Chloride: 106 mmol/L (ref 98–111)
Creatinine, Ser: 2.49 mg/dL — ABNORMAL HIGH (ref 0.44–1.00)
GFR, Estimated: 20 mL/min — ABNORMAL LOW (ref >60)
Glucose, Bld: 154 mg/dL — ABNORMAL HIGH (ref 70–99)
Potassium: 6.4 mmol/L (ref 3.5–5.1)
Sodium: 136 mmol/L (ref 135–145)
Total Bilirubin: 0.5 mg/dL (ref 0.0–1.2)
Total Protein: 7.2 g/dL (ref 6.5–8.1)

## 2023-12-08 LAB — GLUCOSE, CAPILLARY: Glucose-Capillary: 157 mg/dL — ABNORMAL HIGH (ref 70–99)

## 2023-12-08 LAB — MRSA NEXT GEN BY PCR, NASAL: MRSA by PCR Next Gen: NOT DETECTED

## 2023-12-08 LAB — CK: Total CK: 16 U/L — ABNORMAL LOW (ref 38–234)

## 2023-12-08 LAB — LACTIC ACID, PLASMA: Lactic Acid, Venous: 1.4 mmol/L (ref 0.5–1.9)

## 2023-12-08 MED ORDER — INSULIN ASPART 100 UNIT/ML IJ SOLN
0.0000 [IU] | Freq: Three times a day (TID) | INTRAMUSCULAR | Status: DC
  Administered 2023-12-09: 2 [IU] via SUBCUTANEOUS
  Administered 2023-12-09 – 2023-12-12 (×5): 1 [IU] via SUBCUTANEOUS
  Administered 2023-12-12 – 2023-12-14 (×3): 2 [IU] via SUBCUTANEOUS
  Administered 2023-12-14: 1 [IU] via SUBCUTANEOUS
  Administered 2023-12-15: 2 [IU] via SUBCUTANEOUS
  Filled 2023-12-08 (×13): qty 1

## 2023-12-08 MED ORDER — MORPHINE SULFATE (PF) 4 MG/ML IV SOLN: 4.0000 mg | INTRAVENOUS | Status: DC | PRN

## 2023-12-08 MED ORDER — SODIUM CHLORIDE 0.9 % IV SOLN
2.0000 g | Freq: Once | INTRAVENOUS | Status: AC
  Administered 2023-12-08: 2 g via INTRAVENOUS
  Filled 2023-12-08: qty 20

## 2023-12-08 MED ORDER — ONDANSETRON HCL 4 MG/2ML IJ SOLN
4.0000 mg | Freq: Four times a day (QID) | INTRAMUSCULAR | Status: AC | PRN
  Administered 2023-12-09 – 2023-12-10 (×4): 4 mg via INTRAVENOUS
  Filled 2023-12-08 (×4): qty 2

## 2023-12-08 MED ORDER — HYDRALAZINE HCL 20 MG/ML IJ SOLN
5.0000 mg | Freq: Four times a day (QID) | INTRAMUSCULAR | Status: DC | PRN
  Administered 2023-12-10: 5 mg via INTRAVENOUS
  Filled 2023-12-08: qty 1

## 2023-12-08 MED ORDER — VANCOMYCIN HCL 2000 MG/400ML IV SOLN
2000.0000 mg | Freq: Once | INTRAVENOUS | Status: AC
  Administered 2023-12-08: 2000 mg via INTRAVENOUS
  Filled 2023-12-08: qty 400

## 2023-12-08 MED ORDER — VITAMIN C 500 MG PO TABS
500.0000 mg | ORAL_TABLET | Freq: Every day | ORAL | Status: DC
  Administered 2023-12-09 – 2023-12-15 (×6): 500 mg via ORAL
  Filled 2023-12-08 (×6): qty 1

## 2023-12-08 MED ORDER — GABAPENTIN 300 MG PO CAPS
300.0000 mg | ORAL_CAPSULE | Freq: Every day | ORAL | Status: DC
  Administered 2023-12-08 – 2023-12-12 (×5): 300 mg via ORAL
  Filled 2023-12-08 (×7): qty 1

## 2023-12-08 MED ORDER — CLOPIDOGREL BISULFATE 75 MG PO TABS
75.0000 mg | ORAL_TABLET | Freq: Every day | ORAL | Status: DC
  Administered 2023-12-09 – 2023-12-15 (×7): 75 mg via ORAL
  Filled 2023-12-08 (×7): qty 1

## 2023-12-08 MED ORDER — ZINC OXIDE 20 % EX OINT
1.0000 | TOPICAL_OINTMENT | CUTANEOUS | Status: DC | PRN
  Administered 2023-12-09: 1 via TOPICAL
  Filled 2023-12-08: qty 28.35

## 2023-12-08 MED ORDER — ACETAMINOPHEN 650 MG RE SUPP
650.0000 mg | Freq: Four times a day (QID) | RECTAL | Status: AC | PRN
Start: 2023-12-08 — End: 2023-12-13

## 2023-12-08 MED ORDER — SODIUM CHLORIDE 0.9 % IV SOLN: Freq: Once | INTRAVENOUS | Status: AC

## 2023-12-08 MED ORDER — VANCOMYCIN VARIABLE DOSE PER UNSTABLE RENAL FUNCTION (PHARMACIST DOSING): Status: DC

## 2023-12-08 MED ORDER — OXYCODONE HCL 5 MG PO TABS
5.0000 mg | ORAL_TABLET | Freq: Four times a day (QID) | ORAL | Status: DC | PRN
  Administered 2023-12-08 – 2023-12-09 (×4): 5 mg via ORAL
  Filled 2023-12-08 (×4): qty 1

## 2023-12-08 MED ORDER — HYDROXYZINE HCL 10 MG PO TABS
10.0000 mg | ORAL_TABLET | Freq: Three times a day (TID) | ORAL | Status: DC | PRN
  Administered 2023-12-08 – 2023-12-15 (×10): 10 mg via ORAL
  Filled 2023-12-08 (×13): qty 1

## 2023-12-08 MED ORDER — ADULT MULTIVITAMIN W/MINERALS CH
1.0000 | ORAL_TABLET | Freq: Every day | ORAL | Status: DC
  Administered 2023-12-09 – 2023-12-15 (×6): 1 via ORAL
  Filled 2023-12-08 (×6): qty 1

## 2023-12-08 MED ORDER — INSULIN GLARGINE-YFGN 100 UNIT/ML ~~LOC~~ SOLN
14.0000 [IU] | Freq: Two times a day (BID) | SUBCUTANEOUS | Status: DC
  Administered 2023-12-08 – 2023-12-15 (×12): 14 [IU] via SUBCUTANEOUS
  Filled 2023-12-08 (×15): qty 0.14

## 2023-12-08 MED ORDER — ONDANSETRON HCL 4 MG PO TABS
4.0000 mg | ORAL_TABLET | Freq: Four times a day (QID) | ORAL | Status: AC | PRN
  Administered 2023-12-10: 4 mg via ORAL
  Filled 2023-12-08: qty 1

## 2023-12-08 MED ORDER — PATIROMER SORBITEX CALCIUM 8.4 G PO PACK
8.4000 g | PACK | Freq: Every day | ORAL | Status: DC
  Administered 2023-12-08 – 2023-12-15 (×5): 8.4 g via ORAL
  Filled 2023-12-08 (×8): qty 1

## 2023-12-08 MED ORDER — SODIUM CHLORIDE 0.9 % IV SOLN
2.0000 g | INTRAVENOUS | Status: AC
  Administered 2023-12-09 – 2023-12-12 (×4): 2 g via INTRAVENOUS
  Filled 2023-12-08 (×4): qty 20

## 2023-12-08 MED ORDER — SENNOSIDES-DOCUSATE SODIUM 8.6-50 MG PO TABS: 1.0000 | ORAL_TABLET | Freq: Every evening | ORAL | Status: DC | PRN

## 2023-12-08 MED ORDER — MORPHINE SULFATE (PF) 2 MG/ML IV SOLN
2.0000 mg | INTRAVENOUS | Status: DC | PRN
  Administered 2023-12-08 (×2): 2 mg via INTRAVENOUS
  Filled 2023-12-08 (×2): qty 1

## 2023-12-08 MED ORDER — BISACODYL 5 MG PO TBEC: 5.0000 mg | DELAYED_RELEASE_TABLET | Freq: Every day | ORAL | Status: DC | PRN

## 2023-12-08 MED ORDER — INSULIN ASPART 100 UNIT/ML IJ SOLN: 0.0000 [IU] | Freq: Every day | INTRAMUSCULAR | Status: DC

## 2023-12-08 MED ORDER — APIXABAN 5 MG PO TABS
5.0000 mg | ORAL_TABLET | Freq: Two times a day (BID) | ORAL | Status: DC
  Administered 2023-12-08 – 2023-12-15 (×14): 5 mg via ORAL
  Filled 2023-12-08 (×14): qty 1

## 2023-12-08 MED ORDER — ACETAMINOPHEN 325 MG PO TABS
650.0000 mg | ORAL_TABLET | Freq: Four times a day (QID) | ORAL | Status: AC | PRN
  Administered 2023-12-10: 650 mg via ORAL
  Filled 2023-12-08: qty 2

## 2023-12-08 MED ORDER — FERROUS GLUCONATE 324 (38 FE) MG PO TABS
324.0000 mg | ORAL_TABLET | Freq: Every day | ORAL | Status: DC
  Administered 2023-12-09 – 2023-12-15 (×6): 324 mg via ORAL
  Filled 2023-12-08 (×6): qty 1

## 2023-12-08 MED ORDER — SENNOSIDES-DOCUSATE SODIUM 8.6-50 MG PO TABS
2.0000 | ORAL_TABLET | Freq: Every day | ORAL | Status: DC
  Administered 2023-12-14: 2 via ORAL
  Filled 2023-12-08 (×6): qty 2

## 2023-12-08 MED ORDER — LEVOTHYROXINE SODIUM 50 MCG PO TABS
200.0000 ug | ORAL_TABLET | Freq: Every day | ORAL | Status: DC
  Administered 2023-12-09 – 2023-12-15 (×7): 200 ug via ORAL
  Filled 2023-12-08 (×7): qty 4

## 2023-12-08 MED ORDER — SODIUM ZIRCONIUM CYCLOSILICATE 10 G PO PACK
10.0000 g | PACK | Freq: Once | ORAL | Status: AC
  Administered 2023-12-08: 10 g via ORAL
  Filled 2023-12-08: qty 1

## 2023-12-08 NOTE — Assessment & Plan Note (Signed)
 Home long-acting insulin  equivalent, 14 units twice daily resumed Insulin  SSI with at bedtime coverage ordered

## 2023-12-08 NOTE — Assessment & Plan Note (Signed)
 Home levothyroxine  200 micrograms daily before breakfast

## 2023-12-08 NOTE — ED Triage Notes (Signed)
 Pt BIB [facility] from [facility] with reports of worsening cellulitis of the left lower leg and thigh. Pt on oral antibiotics at facility.

## 2023-12-08 NOTE — Progress Notes (Signed)
 Pharmacy Antibiotic Note  [name] is a 68 y.o. female admitted on 12/08/2023 with cellulitis.  Pharmacy has been consulted for Vancomcyin dosing.  Plan: -Vancomcyin 2000 mg loading dose -Patient with acute kidney injury on chronic kidney disease, will check Vancomycin  random level on 6/15 AM -Future doses will be based on renal function  Height: 5' 5 (165.1 cm) Weight: 122.6 kg (270 lb 4.5 oz) IBW/kg (Calculated) : 57  Temp (24hrs), Avg:98.2 F (36.8 C), Min:97.9 F (36.6 C), Max:98.5 F (36.9 C)  Recent Labs  Lab 12/08/23 1140  WBC 10.3  CREATININE 2.49*  LATICACIDVEN 1.4    Estimated Creatinine Clearance: 27.6 mL/min (A) (by C-G formula based on SCr of 2.49 mg/dL (H)).    Allergies  Allergen Reactions   Cefepime  Other (See Comments)    Encephalopathy -- precipitated by AKI and opioids    Antimicrobials this admission: Ceftriaxone   6/13 >>  Vancomycin  6/13 >>   Dose adjustments this admission:  Microbiology results:   Thank you for allowing pharmacy to be a part of this patient's care.  Dania Dupre, PharmD, BCPS 12/08/2023 5:05 PM

## 2023-12-08 NOTE — Hospital Course (Addendum)
 68 year old female with history of hypertension, insulin -dependent diabetes mellitus, hypothyroid, CKD stage IV, who presents to the emergency department for chief concerns of worsening cellulitis of the left lower leg and thigh.  Vitals in the ED showed T of 98.5, rr 16, heart rate 78, blood pressure 125/44, SpO2 98% on room air.  Serum sodium is 136, potassium 6.4, chloride 106, bicarb 20, BUN of 28, serum creatinine 2.49, EGFR 20, nonfasting glucose 154, WBC 10.3, hemoglobin 9.7, platelets of 397.  Lactic acid is 1.4.  EDP discussed patient's potassium with nephrology, Dr. Rhesa Celeste who recommended Veltassa and Lokelma  one-time dose.    ED treatment: Ceftriaxone  2 g IV one-time dose.  Veltassa 8.4 g p.o., Lokelma  10 g p.o.

## 2023-12-08 NOTE — Assessment & Plan Note (Signed)
 -  This complicates overall care and prognosis.

## 2023-12-08 NOTE — Assessment & Plan Note (Addendum)
 Bilateral leg pain Symptomatic support: Oxycodone  5 mg p.o. every 6 hours as needed for moderate pain, 1 day ordered; morphine  2 mg IV every 4 hours as needed for severe pain Morphine  4 mg IV every 4 hours as needed for severe pain, not relieved to IV morphine  2 mg Check CK level

## 2023-12-08 NOTE — ED Provider Notes (Signed)
 [facility] Provider Note    Event Date/Time   First MD Initiated Contact with Patient 12/08/23 1131     (approximate)   History  Infection   HPI  [name] is a 68 y.o. female with past medical history including diabetes, obesity, CKD, hypertension, peripheral vascular disease, CHF who presents with reports of possible cellulitis.  Patient reports burning pain left lateral hip area.  Has been treated with antibiotics at facility but no improvement     Physical Exam   Triage Vital Signs: ED Triage Vitals  Encounter Vitals Group     BP 12/08/23 1133 (!) 140/84     Girls Systolic BP Percentile --      Girls Diastolic BP Percentile --      Boys Systolic BP Percentile --      Boys Diastolic BP Percentile --      Pulse Rate 12/08/23 1133 78     Resp 12/08/23 1133 18     Temp 12/08/23 1133 98.5 F (36.9 C)     Temp Source 12/08/23 1133 Oral     SpO2 12/08/23 1133 98 %     Weight --      Height --      Head Circumference --      Peak Flow --      Pain Score 12/08/23 1132 10     Pain Loc --      Pain Education --      Exclude from Growth Chart --     Most recent vital signs: Vitals:   12/08/23 1133 12/08/23 1200  BP: (!) 140/84 (!) 125/44  Pulse: 78   Resp: 18 16  Temp: 98.5 F (36.9 C)   SpO2: 98%      General: Awake, no distress.  CV:  Good peripheral perfusion.  Resp:  Normal effort.  Abd:  No distention.  Other:  Left lateral hip: Area of healed ulceration with surrounding erythema consistent with cellulitis, no fluctuance to suggest abscess   ED Results / Procedures / Treatments   Labs (all labs ordered are listed, but only abnormal results are displayed) Labs Reviewed  CBC - Abnormal; Notable for the following components:      Result Value   RBC 3.54 (*)    Hemoglobin 9.7 (*)    HCT 31.7 (*)    RDW 15.9 (*)    All other components within normal limits  COMPREHENSIVE METABOLIC PANEL WITH GFR - Abnormal; Notable  for the following components:   Potassium 6.4 (*)    CO2 20 (*)    Glucose, Bld 154 (*)    BUN 28 (*)    Creatinine, Ser 2.49 (*)    Albumin 2.4 (*)    GFR, Estimated 20 (*)    All other components within normal limits  LACTIC ACID, PLASMA  LACTIC ACID, PLASMA     EKG     RADIOLOGY     PROCEDURES:  Critical Care performed:   Procedures   MEDICATIONS ORDERED IN ED: Medications  patiromer Bennye Bravo) packet 8.4 g (has no administration in time range)  sodium zirconium cyclosilicate  (LOKELMA ) packet 10 g (has no administration in time range)  cefTRIAXone  (ROCEPHIN ) 2 g in sodium chloride  0.9 % 100 mL IVPB (0 g Intravenous Stopped 12/08/23 1241)     IMPRESSION / MDM / ASSESSMENT AND PLAN / ED COURSE  I reviewed the triage vital signs and the nursing notes. Patient's presentation is most consistent with acute presentation  with potential threat to life or bodily function.  Patient presents with worsening cellulitis failed outpatient antibiotic treatment.  She is afebrile, labs pending, no SIRS criteria, not consistent with sepsis.  Will treat with IV Rocephin , labs pending  Lactic acid is normal, white blood cell count is normal.  However patient has potassium of 6.4.  Consulted with Dr. Rhesa Celeste of nephrology who recommends Lokelma  and Veltassa p.o.  Will consult with the hospitalist team for admission      FINAL CLINICAL IMPRESSION(S) / ED DIAGNOSES   Final diagnoses:  Cellulitis of left lower extremity  Hyperkalemia     Rx / DC Orders   ED Discharge Orders     None        Note:  This document was prepared using Dragon voice recognition software and may include unintentional dictation errors.   Bryson Carbine, MD 12/08/23 1251

## 2023-12-08 NOTE — Consult Note (Signed)
 WOC Nurse Consult Note: Reason for Consult: requested to assess wounds on lower extremities legs due a possible cellulitis. Wounds on heels. Performed remotely after photos and notes. Wound type: Cellulitis. Venous insufficiency? Pressure Injury POA: NA Measurement: bilateral legs, multiple wounds. Wound bed: 100% red. Drainage (amount, consistency, odor) Moderate amount due the characteristics of the wounds, serous. Periwound: cover by scabs. Dressing procedure/placement/frequency: Clean with Vashe [id number], try to peeling of the losing scabs during the cleaning. Apply Xeroform on the wound bed (legs and heel). Wrap with Kerlix and ACE wrap as a multi layer compressive therapy. Wrap since the base of the toes, until the knees. Change daily.  WOC team will not plan to follow further.  Please reconsult if further assistance is needed. Thank-you,  Rachel Budds BSN, RN, [employer], WOC  (Pager: [phone])

## 2023-12-08 NOTE — Progress Notes (Signed)
 [facility]  ROUNDING NOTE   Subjective:   Patient well-known to us  as we have seen her in the past for chronic kidney disease. Back in March her EGFR was 40. More recently renal function has been worse. Most recent EGFR was 20. Noted to have significant hyperkalemia today with potassium of 6.4. Comes in with cellulitis in the left lower extremity near the hip.  Objective:  Vital signs in last 24 hours:  Temp:  [97.9 F (36.6 C)-98.5 F (36.9 C)] 97.9 F (36.6 C) (06/13 1604) Pulse Rate:  [62-78] 68 (06/13 1604) Resp:  [15-19] 16 (06/13 1604) BP: (124-140)/(40-84) 124/45 (06/13 1604) SpO2:  [98 %-100 %] 100 % (06/13 1604) Weight:  [122.6 kg] 122.6 kg (06/13 1411)  Weight change:  Filed Weights   12/08/23 1411  Weight: 122.6 kg    Intake/Output: No intake/output data recorded.   Intake/Output this shift:  Total I/O In: 249.8 [IV Piggyback:249.8] Out: -   Physical Exam: General: No acute distress  Head: Normocephalic, atraumatic. Moist oral mucosal membranes  Neck: Supple  Lungs:  Clear to auscultation, normal effort  Heart: S1S2 no rubs  Abdomen:  Soft, nontender, bowel sounds present  Extremities: 1+ lower extremity edema, area of erythema surrounding left hip  Neurologic: Awake, alert, following commands  Skin: Warm, dry  Access: No hemodialysis access    Basic Metabolic Panel: Recent Labs  Lab 12/08/23 1140  NA 136  K 6.4*  CL 106  CO2 20*  GLUCOSE 154*  BUN 28*  CREATININE 2.49*  CALCIUM  9.0    Liver Function Tests: Recent Labs  Lab 12/08/23 1140  AST 23  ALT 12  ALKPHOS 81  BILITOT 0.5  PROT 7.2  ALBUMIN 2.4*   No results for input(s): LIPASE, AMYLASE in the last 168 hours. No results for input(s): AMMONIA in the last 168 hours.  CBC: Recent Labs  Lab 12/08/23 1140  WBC 10.3  HGB 9.7*  HCT 31.7*  MCV 89.5  PLT 397    Cardiac Enzymes: No results for input(s): CKTOTAL, CKMB, CKMBINDEX, TROPONINI  in the last 168 hours.  BNP: Invalid input(s): POCBNP  CBG: No results for input(s): GLUCAP in the last 168 hours.  Microbiology: Results for orders placed or performed during the hospital encounter of 09/24/23  Resp panel by RT-PCR (RSV, Flu A&B, Covid) Anterior Nasal Swab     Status: None   Collection Time: 09/24/23 12:21 PM   Specimen: Anterior Nasal Swab  Result Value Ref Range Status   SARS Coronavirus 2 by RT PCR NEGATIVE NEGATIVE Final    Comment: (NOTE) SARS-CoV-2 target nucleic acids are NOT DETECTED.  The SARS-CoV-2 RNA is generally detectable in upper respiratory specimens during the acute phase of infection. The lowest concentration of SARS-CoV-2 viral copies this assay can detect is 138 copies/mL. A negative result does not preclude SARS-Cov-2 infection and should not be used as the sole basis for treatment or other patient management decisions. A negative result may occur with  improper specimen collection/handling, submission of specimen other than nasopharyngeal swab, presence of viral mutation(s) within the areas targeted by this assay, and inadequate number of viral copies(<138 copies/mL). A negative result must be combined with clinical observations, patient history, and epidemiological information. The expected result is Negative.  Fact Sheet for Patients:  BloggerCourse.com  Fact Sheet for Healthcare Providers:  SeriousBroker.it  This test is no t yet approved or cleared by the [place]  FDA and  has been authorized for detection and/or  diagnosis of SARS-CoV-2 by FDA under an Emergency Use Authorization (EUA). This EUA will remain  in effect (meaning this test can be used) for the duration of the COVID-19 declaration under Section 564(b)(1) of the Act, 21 U.S.C.section 360bbb-3(b)(1), unless the authorization is terminated  or revoked sooner.       Influenza A by PCR NEGATIVE NEGATIVE Final    Influenza B by PCR NEGATIVE NEGATIVE Final    Comment: (NOTE) The Xpert Xpress SARS-CoV-2/FLU/RSV plus assay is intended as an aid in the diagnosis of influenza from Nasopharyngeal swab specimens and should not be used as a sole basis for treatment. Nasal washings and aspirates are unacceptable for Xpert Xpress SARS-CoV-2/FLU/RSV testing.  Fact Sheet for Patients: BloggerCourse.com  Fact Sheet for Healthcare Providers: SeriousBroker.it  This test is not yet approved or cleared by the [place]  FDA and has been authorized for detection and/or diagnosis of SARS-CoV-2 by FDA under an Emergency Use Authorization (EUA). This EUA will remain in effect (meaning this test can be used) for the duration of the COVID-19 declaration under Section 564(b)(1) of the Act, 21 U.S.C. section 360bbb-3(b)(1), unless the authorization is terminated or revoked.     Resp Syncytial Virus by PCR NEGATIVE NEGATIVE Final    Comment: (NOTE) Fact Sheet for Patients: BloggerCourse.com  Fact Sheet for Healthcare Providers: SeriousBroker.it  This test is not yet approved or cleared by the [place]  FDA and has been authorized for detection and/or diagnosis of SARS-CoV-2 by FDA under an Emergency Use Authorization (EUA). This EUA will remain in effect (meaning this test can be used) for the duration of the COVID-19 declaration under Section 564(b)(1) of the Act, 21 U.S.C. section 360bbb-3(b)(1), unless the authorization is terminated or revoked.  Performed at [facility], [address], [place], [place] [address]   Urine Culture     Status: None   Collection Time: 09/24/23 12:21 PM   Specimen: Urine, Random  Result Value Ref Range Status   Specimen Description   Final    URINE, RANDOM Performed at [facility], [address], [place], [place] [address]    Special Requests    Final    NONE Reflexed from [id number] Performed at [facility], [address] [place], [place] [address]    Culture   Final    NO GROWTH Performed at [facility], [address], [place], [place] [address]    Report Status 09/25/2023 FINAL  Final  Blood Culture (routine x 2)     Status: None   Collection Time: 09/24/23 12:22 PM   Specimen: BLOOD LEFT FOREARM  Result Value Ref Range Status   Specimen Description BLOOD LEFT FOREARM  Final   Special Requests   Final    BOTTLES DRAWN AEROBIC ONLY Blood Culture results may not be optimal due to an inadequate volume of blood received in culture bottles   Culture   Final    NO GROWTH 5 DAYS Performed at [facility], [address] [place], [place] [address]    Report Status 09/29/2023 FINAL  Final  Blood Culture (routine x 2)     Status: None   Collection Time: 09/24/23 12:23 PM   Specimen: Left Antecubital; Blood  Result Value Ref Range Status   Specimen Description LEFT ANTECUBITAL  Final   Special Requests   Final    BOTTLES DRAWN AEROBIC AND ANAEROBIC Blood Culture adequate volume   Culture   Final    NO GROWTH 5  DAYS Performed at [facility], [address], [place], [place] [address]    Report Status 09/29/2023 FINAL  Final  MRSA Next Gen by PCR, Nasal     Status: None   Collection Time: 09/25/23  8:44 PM   Specimen: Nasal Mucosa; Nasal Swab  Result Value Ref Range Status   MRSA by PCR Next Gen NOT DETECTED NOT DETECTED Final    Comment: (NOTE) The GeneXpert MRSA Assay (FDA approved for NASAL specimens only), is one component of a comprehensive MRSA colonization surveillance program. It is not intended to diagnose MRSA infection nor to guide or monitor treatment for MRSA infections. Test performance is not FDA approved in patients less than 21 years old. Performed at [facility], [address] [place], [place] [address]     Coagulation Studies: No results for  input(s): LABPROT, INR in the last 72 hours.  Urinalysis: No results for input(s): COLORURINE, LABSPEC, PHURINE, GLUCOSEU, HGBUR, BILIRUBINUR, KETONESUR, PROTEINUR, UROBILINOGEN, NITRITE, LEUKOCYTESUR in the last 72 hours.  Invalid input(s): APPERANCEUR    Imaging: No results found.   Medications:    vancomycin  2,000 mg (12/08/23 1531)    apixaban   5 mg Oral BID   [START ON 12/09/2023] levothyroxine   200 mcg Oral QAC breakfast   patiromer  8.4 g Oral Daily   acetaminophen  **OR** acetaminophen , bisacodyl , morphine  injection, ondansetron  **OR** ondansetron  (ZOFRAN ) IV, oxyCODONE , senna-docusate  Assessment/ Plan:  68 y.o. female with past medical history of chronic bilateral lower extremity wounds, left fibula osteomyelitis, chronic systolic heart failure ejection fraction 45%, chronic lymphedema, hypertension, diabetes mellitus type 2, chronic kidney disease stage IIIb who presented with left lower extremity cellulitis and found to have significant hyperkalemia.  1.  Acute kidney injury/chronic kidney disease stage IIIb baseline creatinine 1.4 with EGFR 40 on 09/12/2023.  More recently renal function has been significantly worse.  eGFR currently 20 now.  Could be related to concurrent infection.  Check renal ultrasound to make sure no underlying obstruction.  Hold off on hydration given underlying lymphedema.  2.  Hyperkalemia.  Patient has been given both Lokelma  and Veltassa.  Continue to trend serum potassium.  No immediate need for dialysis at the moment.  3.  Anemia of chronic kidney disease.  Hemoglobin 9.7.  No immediate need for Epogen but may need to consider this as an outpatient.   LOS: 0 Dao Memmott 6/13/20254:32 PM

## 2023-12-08 NOTE — Assessment & Plan Note (Addendum)
 Pending med reconciliation AM team to resume home antihypertensive medication Hydralazine  5 mg IV every 6 hours as needed for SBP > 170, 5 days ordered

## 2023-12-08 NOTE — Assessment & Plan Note (Signed)
 Ceftriaxone  2 g IV daily continue to complete 5-day course Vancomycin  per pharmacy

## 2023-12-08 NOTE — H&P (Signed)
 History and Physical   [name] HYQ:[id number] DOB: [birth year] DOA: 12/08/2023  PCP: Pcp, No  Outpatient Specialists: Dr. Francee Inch, infectious disease specialist Patient coming from: [facility]  I have personally briefly reviewed patient's old medical records in [facility] EMR.  Chief Concern: Left lower leg cellulitis  HPI: 68 year old female with history of hypertension, insulin -dependent diabetes mellitus, hypothyroid, CKD stage IV, who presents to the emergency department for chief concerns of worsening cellulitis of the left lower leg and thigh.  Vitals in the ED showed T of 98.5, rr 16, heart rate 78, blood pressure 125/44, SpO2 98% on room air.  Serum sodium is 136, potassium 6.4, chloride 106, bicarb 20, BUN of 28, serum creatinine 2.49, EGFR 20, nonfasting glucose 154, WBC 10.3, hemoglobin 9.7, platelets of 397.  Lactic acid is 1.4.  EDP discussed patient's potassium with nephrology, Dr. Rhesa Celeste who recommended Veltassa and Lokelma  one-time dose.    ED treatment: Ceftriaxone  2 g IV one-time dose.  Veltassa 8.4 g p.o., Lokelma  10 g p.o. ------------------------------------ At bedside, patient able to tell me her first and last name, age, location, current calendar year.  Patient reports that both her legs started she does not know why.  She denies trauma to her person.  She reports she has been dealing with this chronic leg pain that has been excruciating over the last few weeks.  She has not done anything differently.  She denies chest pain, shortness of breath, abdominal pain, dysuria, hematuria, diarrhea.  Social history: Patient lives at [facility].  She denies tobacco, EtOH, recreational drug use.  ROS: Constitutional: no weight change, no fever ENT/Mouth: no sore throat, no rhinorrhea Eyes: no eye pain, no vision changes Cardiovascular: no chest pain, no dyspnea,  no edema, no palpitations Respiratory: no cough, no sputum, no  wheezing Gastrointestinal: no nausea, no vomiting, no diarrhea, no constipation Genitourinary: no urinary incontinence, no dysuria, no hematuria Musculoskeletal: no arthralgias, + myalgias Skin: no skin lesions, no pruritus, Neuro: + weakness, no loss of consciousness, no syncope Psych: no anxiety, no depression, + decrease appetite Heme/Lymph: no bruising, no bleeding  ED Course: Discussed with EDP, patient requiring hospitalization for chief concerns of left lower leg cellulitis failing outpatient therapy.  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  Leg pain Bilateral leg pain Symptomatic support: Oxycodone  5 mg p.o. every 6 hours as needed for moderate pain, 1 day ordered; morphine  2 mg IV every 4 hours as needed for severe pain Morphine  4 mg IV every 4 hours as needed for severe pain, not relieved to IV morphine  2 mg Check CK level  Obesity, Class III, BMI 40-49.9 (morbid obesity) This complicates overall care and prognosis.   Type II  diabetes mellitus with renal manifestations (HCC) Home long-acting insulin  equivalent, 14 units twice daily resumed Insulin  SSI with at bedtime coverage  ordered  Essential hypertension Pending med reconciliation AM team to resume home antihypertensive medication Hydralazine  5 mg IV every 6 hours as needed for SBP > 170, 5 days ordered  Hypothyroidism Home levothyroxine  200 micrograms daily before breakfast  Chart reviewed.   DVT prophylaxis: Apixaban  Code Status: DNR/DNI Diet: Renal/carb modified Family Communication: No Disposition Plan: pending clinical course Consults called: Neurology Admission status: Telemetry medical, observation  Past Medical History:  Diagnosis Date   Acute congestive heart failure (HCC) 09/26/2023   Diabetes mellitus without complication (HCC) 2010   Hypertension    Hypothyroidism    Kidney stone 11/2014   history of/STAGE 4 KIDNEY DISEASE PER DR Lamount Pimple   Lymphedema    Multiple open wounds of lower leg    PT BEING SEEN BY THE [facility] FOR CHRONIC BLISTERS PER PT   Past Surgical History:  Procedure Laterality Date   AMPUTATION Right 05/03/2021   Procedure: AMPUTATION RAY;  Surgeon: Pink Bridges, DPM;  Location: [facility] ORS;  Service: Podiatry;  Laterality: Right;  RIght Big Toe and Right Fifth Toe   IRRIGATION AND DEBRIDEMENT FOOT Bilateral 05/03/2021   Procedure: IRRIGATION AND DEBRIDEMENT FEET;  Surgeon: Pink Bridges, DPM;  Location: [facility] ORS;  Service: Podiatry;  Laterality: Bilateral;   LOWER EXTREMITY ANGIOGRAPHY Right 04/27/2021   Procedure: Lower Extremity Angiography;  Surgeon: Jackquelyn Mass, MD;  Location: [facility] INV[facility]IVE [facility];  Service: Cardiovascular;  Laterality: Right;   LOWER EXTREMITY ANGIOGRAPHY Left 04/30/2021   Procedure: Lower Extremity Angiography;  Surgeon: Celso College, MD;  Location: [facility] INV[facility] CV [facility];  Service: Cardiovascular;  Laterality: Left;   TOTAL HIP ARTHROPLASTY Right 05/28/2015   Procedure: TOTAL HIP ARTHROPLASTY ANTERIOR APPROACH;  Surgeon: Molli Angelucci, MD;  Location: [facility] ORS;  Service: Orthopedics;  Laterality: Right;   Social History:  reports that  she has never smoked. She has never used smokeless tobacco. She reports that she does not drink alcohol and does not use drugs.  Allergies  Allergen Reactions   Cefepime  Other (See Comments)    Encephalopathy -- precipitated by AKI and opioids   Family History  Problem Relation Age of Onset   Cancer Mother    Diabetes Father    Family history: Family history reviewed and not pertinent.  Prior to Admission medications   Medication Sig Start Date End Date Taking? Authorizing Provider  acetaminophen  (TYLENOL ) 325 MG tablet Take 2 tablets (650 mg total) by mouth every 6 (six) hours as needed for mild pain (or Fever >/= 101). 05/18/21   Verla Glaze, MD  amLODipine  (NORVASC ) 5 MG tablet Take 5 mg by mouth daily.    [provider]  apixaban  (ELIQUIS ) 5 MG TABS tablet Take 1 tablet (5 mg total) by mouth 2 (two) times daily. 10/02/23 11/01/23  Alphonsus Jeans, MD  ascorbic acid  (VITAMIN C ) 500 MG tablet Take 1 tablet (500 mg total) by mouth daily. 11/03/21   Verla Glaze, MD  blood glucose meter kit and supplies 1 each by Other route daily. ONE TOUCH ULTRA METER. Use as directed. E11.29 10/23/20   Clarise Crooks, MD  cloNIDine  (CATAPRES ) 0.3 MG tablet TAKE ONE TABLET BY MOUTH 3 TIMES DAILY. 10/22/20   Clarise Crooks, MD  clopidogrel  (PLAVIX ) 75 MG tablet Take 1 tablet (75 mg total) by mouth daily with breakfast. 05/19/21   Verla Glaze, MD  diclofenac  Sodium (VOLTAREN) 1 % GEL Apply 2 g topically 3 (three) times daily.    [provider]  ferrous gluconate  (FERGON) 324 MG tablet Take 324 mg by mouth daily with breakfast.    [provider]  furosemide  (LASIX ) 20 MG tablet Take 1 tablet (20 mg total) by mouth 2 (two) times daily. 05/18/21   Verla Glaze, MD  gabapentin  (NEURONTIN ) 300 MG capsule Take 1 capsule (300 mg total) by mouth at bedtime. 09/15/23   Darus Engels A, DO  glucose blood test strip 1 each by Other route daily. ONE TOUCH ULTRA TEST  STRIPS E11.29 10/26/20   Clarise Crooks, MD  HYDROmorphone  (DILAUDID ) 2 MG tablet Take 0.5 tablets (1 mg total) by mouth every 4 (four) hours as needed for moderate pain (pain score 4-6). 09/15/23   Montey Apa, DO  HYDROmorphone  (DILAUDID ) 2 MG tablet Take 1 tablet (2 mg total) by mouth every 12 (twelve) hours as needed (with dressing changes). 09/15/23   Montey Apa, DO  hydrOXYzine  (ATARAX ) 10 MG tablet Take 10 mg by mouth every 8 (eight) hours as needed for itching.    [provider]  insulin  aspart (NOVOLOG ) 100 UNIT/ML injection Inject 3 Units into the skin 3 (three) times daily with meals. 05/18/21   Verla Glaze, MD  irbesartan  (AVAPRO ) 150 MG tablet Take 1 tablet (150 mg total) by mouth daily. 05/19/21   Verla Glaze, MD  Lancets [facility] ULTRASOFT) lancets 1 each by Other route daily. Use as instructed E11.29 10/23/20   Clarise Crooks, MD  LANTUS  SOLOSTAR 100 UNIT/ML Solostar Pen Inject 14 Units into the skin every 12 (twelve) hours. 09/15/23   Montey Apa, DO  levothyroxine  (SYNTHROID ) 200 MCG tablet Take 200 mcg by mouth daily before breakfast.    [provider]  metoprolol  succinate (TOPROL -XL) 25 MG 24 hr tablet Take 0.5 tablets (12.5 mg total) by mouth daily with breakfast. 05/19/21   Verla Glaze, MD  Multiple Vitamin (MULTIVITAMIN WITH MINERALS) TABS tablet Take 1 tablet by mouth daily. 11/04/21   Verla Glaze, MD  NYSTATIN  powder Apply 1 application. topically 3 (three) times daily. 10/29/21   [provider]  ondansetron  (ZOFRAN -ODT) 4 MG disintegrating tablet Take 2 mg by mouth every 6 (six) hours as needed. 10/21/21   [provider]  senna-docusate (SENOKOT-S) 8.6-50 MG tablet Take 2 tablets by mouth at bedtime.    [provider]  zinc  oxide 20 % ointment Apply 1 Application topically as needed for diaper changes.    [provider]   Physical Exam: Vitals:   12/08/23 1400 12/08/23 1411  12/08/23 1529 12/08/23 1604  BP: (!) 139/40  128/61 (!) 124/45  Pulse: 62  72 68  Resp: 19  19 16   Temp: 98.3 F (36.8 C)  97.9 F (36.6 C) 97.9 F (36.6 C)  TempSrc: Oral  Oral   SpO2: 99%  99% 100%  Weight:  122.6 kg    Height:  5' 5 (1.651 m)     Constitutional: appears frail, miserable Eyes: PERRL, lids and conjunctivae normal ENMT: Mucous membranes are moist. Posterior pharynx clear of any exudate or lesions. Age-appropriate dentition. Hearing appropriate Neck: normal, supple, no masses, no thyromegaly Respiratory: clear to auscultation bilaterally, no wheezing, no crackles. Normal respiratory effort. No accessory muscle use.  Cardiovascular: Regular rate and rhythm, no murmurs / rubs / gallops. No extremity edema. 2+ pedal pulses. No carotid bruits.  Abdomen: Morbidly obese abdomen, no tenderness, no masses  palpated, no hepatosplenomegaly. Bowel sounds positive.  Musculoskeletal: no clubbing / cyanosis. No joint deformity upper and lower extremities. Good ROM, no contractures, no atrophy. Normal muscle tone.  Skin: no rashes, lesions, ulcers. No induration.  Bilateral leg discoloration with possible cellulitis.  Wrapping in place Neurologic: Sensation intact. Strength 5/5 in all 4.  Psychiatric: Normal judgment and insight. Alert and oriented x 3. Normal mood.   EKG: independently reviewed, showing sinus rhythm with rate of 82, QTc 404  Chest x-ray on Admission: not indicated at this time  No results found.  Labs on Admission: I have personally reviewed following labs CBC: Recent Labs  Lab 12/08/23 1140  WBC 10.3  HGB 9.7*  HCT 31.7*  MCV 89.5  PLT 397   Basic Metabolic Panel: Recent Labs  Lab 12/08/23 1140  NA 136  K 6.4*  CL 106  CO2 20*  GLUCOSE 154*  BUN 28*  CREATININE 2.49*  CALCIUM  9.0   GFR: Estimated Creatinine Clearance: 27.6 mL/min (A) (by C-G formula based on SCr of 2.49 mg/dL (H)).  Liver Function Tests: Recent Labs  Lab 12/08/23 1140   AST 23  ALT 12  ALKPHOS 81  BILITOT 0.5  PROT 7.2  ALBUMIN 2.4*   Urine analysis:    Component Value Date/Time   COLORURINE YELLOW (A) 09/24/2023 1221   APPEARANCEUR CLOUDY (A) 09/24/2023 1221   LABSPEC 1.023 09/24/2023 1221   PHURINE 5.0 09/24/2023 1221   GLUCOSEU NEGATIVE 09/24/2023 1221   HGBUR SMALL (A) 09/24/2023 1221   BILIRUBINUR NEGATIVE 09/24/2023 1221   BILIRUBINUR negative 12/22/2014 0846   KETONESUR 5 (A) 09/24/2023 1221   PROTEINUR 100 (A) 09/24/2023 1221   UROBILINOGEN 0.2 12/22/2014 0846   NITRITE NEGATIVE 09/24/2023 1221   LEUKOCYTESUR NEGATIVE 09/24/2023 1221   This document was prepared using Dragon Voice Recognition software and may include unintentional dictation errors.  Dr. Reinhold Carbine [facility]  If 7PM-7AM, please contact overnight-coverage provider If 7AM-7PM, please contact day attending provider [email or web address]  12/08/2023, 4:47 PM

## 2023-12-08 NOTE — Assessment & Plan Note (Signed)
 Nephrology has been consulted by EDP Veltassa and Lokelma  recommended Recheck BMP in a.m.

## 2023-12-09 DIAGNOSIS — L97919 Non-pressure chronic ulcer of unspecified part of right lower leg with unspecified severity: Secondary | ICD-10-CM | POA: Diagnosis not present

## 2023-12-09 DIAGNOSIS — I5022 Chronic systolic (congestive) heart failure: Secondary | ICD-10-CM | POA: Diagnosis not present

## 2023-12-09 DIAGNOSIS — L97929 Non-pressure chronic ulcer of unspecified part of left lower leg with unspecified severity: Secondary | ICD-10-CM | POA: Diagnosis not present

## 2023-12-09 DIAGNOSIS — E872 Acidosis, unspecified: Secondary | ICD-10-CM | POA: Diagnosis not present

## 2023-12-09 DIAGNOSIS — Z79890 Hormone replacement therapy (postmenopausal): Secondary | ICD-10-CM | POA: Diagnosis not present

## 2023-12-09 DIAGNOSIS — E1151 Type 2 diabetes mellitus with diabetic peripheral angiopathy without gangrene: Secondary | ICD-10-CM | POA: Diagnosis not present

## 2023-12-09 DIAGNOSIS — E039 Hypothyroidism, unspecified: Secondary | ICD-10-CM | POA: Diagnosis not present

## 2023-12-09 DIAGNOSIS — L97829 Non-pressure chronic ulcer of other part of left lower leg with unspecified severity: Secondary | ICD-10-CM | POA: Diagnosis not present

## 2023-12-09 DIAGNOSIS — R609 Edema, unspecified: Secondary | ICD-10-CM | POA: Diagnosis not present

## 2023-12-09 DIAGNOSIS — Z7901 Long term (current) use of anticoagulants: Secondary | ICD-10-CM | POA: Diagnosis not present

## 2023-12-09 DIAGNOSIS — E1122 Type 2 diabetes mellitus with diabetic chronic kidney disease: Secondary | ICD-10-CM | POA: Diagnosis not present

## 2023-12-09 DIAGNOSIS — Z7902 Long term (current) use of antithrombotics/antiplatelets: Secondary | ICD-10-CM | POA: Diagnosis not present

## 2023-12-09 DIAGNOSIS — Z66 Do not resuscitate: Secondary | ICD-10-CM | POA: Diagnosis not present

## 2023-12-09 DIAGNOSIS — L97819 Non-pressure chronic ulcer of other part of right lower leg with unspecified severity: Secondary | ICD-10-CM | POA: Diagnosis not present

## 2023-12-09 DIAGNOSIS — Z794 Long term (current) use of insulin: Secondary | ICD-10-CM | POA: Diagnosis not present

## 2023-12-09 DIAGNOSIS — N179 Acute kidney failure, unspecified: Secondary | ICD-10-CM | POA: Diagnosis not present

## 2023-12-09 DIAGNOSIS — E785 Hyperlipidemia, unspecified: Secondary | ICD-10-CM | POA: Diagnosis not present

## 2023-12-09 DIAGNOSIS — D631 Anemia in chronic kidney disease: Secondary | ICD-10-CM | POA: Diagnosis not present

## 2023-12-09 DIAGNOSIS — L03116 Cellulitis of left lower limb: Secondary | ICD-10-CM | POA: Diagnosis not present

## 2023-12-09 DIAGNOSIS — E66813 Obesity, class 3: Secondary | ICD-10-CM | POA: Diagnosis not present

## 2023-12-09 DIAGNOSIS — I130 Hypertensive heart and chronic kidney disease with heart failure and stage 1 through stage 4 chronic kidney disease, or unspecified chronic kidney disease: Secondary | ICD-10-CM | POA: Diagnosis not present

## 2023-12-09 DIAGNOSIS — I87313 Chronic venous hypertension (idiopathic) with ulcer of bilateral lower extremity: Secondary | ICD-10-CM | POA: Diagnosis not present

## 2023-12-09 DIAGNOSIS — E876 Hypokalemia: Secondary | ICD-10-CM | POA: Diagnosis not present

## 2023-12-09 DIAGNOSIS — Z833 Family history of diabetes mellitus: Secondary | ICD-10-CM | POA: Diagnosis not present

## 2023-12-09 DIAGNOSIS — N1832 Chronic kidney disease, stage 3b: Secondary | ICD-10-CM | POA: Diagnosis not present

## 2023-12-09 DIAGNOSIS — E875 Hyperkalemia: Secondary | ICD-10-CM | POA: Diagnosis not present

## 2023-12-09 LAB — CBC
HCT: 32.3 % — ABNORMAL LOW (ref 36.0–46.0)
Hemoglobin: 9.7 g/dL — ABNORMAL LOW (ref 12.0–15.0)
MCH: 27.2 pg (ref 26.0–34.0)
MCHC: 30 g/dL (ref 30.0–36.0)
MCV: 90.7 fL (ref 80.0–100.0)
Platelets: 392 10*3/uL (ref 150–400)
RBC: 3.56 MIL/uL — ABNORMAL LOW (ref 3.87–5.11)
RDW: 15.9 % — ABNORMAL HIGH (ref 11.5–15.5)
WBC: 8.5 10*3/uL (ref 4.0–10.5)
nRBC: 0 % (ref 0.0–0.2)

## 2023-12-09 LAB — BASIC METABOLIC PANEL WITH GFR
Anion gap: 8 (ref 5–15)
BUN: 29 mg/dL — ABNORMAL HIGH (ref 8–23)
CO2: 22 mmol/L (ref 22–32)
Calcium: 8.9 mg/dL (ref 8.9–10.3)
Chloride: 106 mmol/L (ref 98–111)
Creatinine, Ser: 2.55 mg/dL — ABNORMAL HIGH (ref 0.44–1.00)
GFR, Estimated: 20 mL/min — ABNORMAL LOW (ref >60)
Glucose, Bld: 149 mg/dL — ABNORMAL HIGH (ref 70–99)
Potassium: 5.9 mmol/L — ABNORMAL HIGH (ref 3.5–5.1)
Sodium: 136 mmol/L (ref 135–145)

## 2023-12-09 LAB — GLUCOSE, CAPILLARY
Glucose-Capillary: 114 mg/dL — ABNORMAL HIGH (ref 70–99)
Glucose-Capillary: 144 mg/dL — ABNORMAL HIGH (ref 70–99)
Glucose-Capillary: 157 mg/dL — ABNORMAL HIGH (ref 70–99)
Glucose-Capillary: 144 mg/dL — ABNORMAL HIGH (ref 70–99)

## 2023-12-09 LAB — PHOSPHORUS: Phosphorus: 5.3 mg/dL — ABNORMAL HIGH (ref 2.5–4.6)

## 2023-12-09 MED ORDER — SODIUM ZIRCONIUM CYCLOSILICATE 10 G PO PACK
10.0000 g | PACK | Freq: Once | ORAL | Status: DC
  Filled 2023-12-09: qty 1

## 2023-12-09 NOTE — Progress Notes (Signed)
  Progress Note   Patient: [name] Scarfo ZOX:[id number] DOB: July 22, 1955 DOA: 12/08/2023     0 DOS: the patient was seen and examined on 12/09/2023   Brief hospital course: 68 year old female with history of hypertension, insulin -dependent diabetes mellitus, hypothyroid, CKD stage IV, who presents to the emergency department for chief concerns of worsening cellulitis of the left lower leg and thigh.   See H&P for full HPI on admission & ED course.  Patient was admitted 12/08/23 and started on IV antibiotics with left leg cellulitis as outlined in detail below.    Assessment and Plan:  * Cellulitis of left lower extremity --Continue Rocephin  & Vancomycin  --Monitor clinically for improvement --Follow blood cultures  Hyperkalemia --Nephrology consulted --Veltassa and Lokelma   --Monitor BMP's  Bilateral leg pain Symptomatic support: Oxycodone  5 mg p.o. every 6 hours as needed for moderate pain, 1 day ordered; morphine  2 mg IV every 4 hours as needed for severe pain Morphine  4 mg IV every 4 hours as needed for severe pain, not relieved to IV morphine  2 mg Check CK level  Type II diabetes mellitus with renal manifestations (HCC) --Home dose basal 14 units BID --Sliding scale Novolog   Essential hypertension --Home meds not resumed yet, BP's labile, soft DBP's --Med rec still pending --IV hydralazine  PRN  Hypothyroidism --Continue levothyroxine    Obesity, Class III, BMI 40-49.9 (morbid obesity) Complicates overall care and prognosis.  Recommend lifestyle modifications including physical activity and diet for weight loss and overall long-term health.       Subjective: Pt awake resting in bed on rounds this AM. Reports legs hurting.  Area on her left lateral thigh was where concern for cellulitis and redness.  No fevers.    Physical Exam: Vitals:   12/08/23 2029 12/09/23 0426 12/09/23 0734 12/09/23 1538  BP: (!) 124/46 (!) 166/60 (!) 163/66 120/60  Pulse: 73 100 (!) 105 99   Resp: 15 16 17 16   Temp: 98.7 F (37.1 C) 98.4 F (36.9 C) 98.3 F (36.8 C) 99.7 F (37.6 C)  TempSrc:   Oral   SpO2: 97% 100% 100% 100%  Weight:      Height:       General exam: awake, alert, no acute distress, morbidly obese HEENT: moist mucus membranes, hearing grossly normal  Respiratory system: CTAB diminished due to body habitus, no wheezes, rales or rhonchi, normal respiratory effort. Cardiovascular system: normal S1/S2, RRR, BLE in wraps.   Gastrointestinal system: soft, NT, ND, no HSM felt, +bowel sounds. Central nervous system: A&O x 3. no gross focal neurologic deficits, normal speech Extremities: proximal left lateral thigh area of faint erythema no differential warmth, b/l distal LE's in wraps Skin: dry, intact, normal temperature Psychiatry: normal mood, congruent affect, judgement and insight appear normal   Data Reviewed:  Notable labs --  [name] 6.4 >> 5.9 Glucose 149 BUN 29 Cr 2.55 Phos 5.3 Hbg 9.7    Family Communication: none. Pt updated in detail.  Disposition: Status is: Inpatient Remains inpatient appropriate because: remains on IV antibiotics pending further clinical improvement   Planned Discharge Destination: Skilled nursing facility    Time spent: 45 minutes  Author: Montey Apa, DO 12/09/2023 6:24 PM  For on call review [email or web address].

## 2023-12-09 NOTE — Plan of Care (Signed)
  Problem: Education: Goal: Knowledge of General Education information will improve Description: Including pain rating scale, medication(s)/side effects and non-pharmacologic comfort measures Outcome: Progressing   Problem: Health Behavior/Discharge Planning: Goal: Ability to manage health-related needs will improve Outcome: Progressing   Problem: Clinical Measurements: Goal: Ability to maintain clinical measurements within normal limits will improve Outcome: Progressing Goal: Diagnostic test results will improve Outcome: Progressing Goal: Respiratory complications will improve Outcome: Progressing Goal: Cardiovascular complication will be avoided Outcome: Progressing   Problem: Activity: Goal: Risk for activity intolerance will decrease Outcome: Progressing   Problem: Nutrition: Goal: Adequate nutrition will be maintained Outcome: Progressing   Problem: Coping: Goal: Level of anxiety will decrease Outcome: Progressing   Problem: Elimination: Goal: Will not experience complications related to bowel motility Outcome: Progressing Goal: Will not experience complications related to urinary retention Outcome: Progressing   Problem: Pain Managment: Goal: General experience of comfort will improve and/or be controlled Outcome: Progressing   Problem: Safety: Goal: Ability to remain free from injury will improve Outcome: Progressing   Problem: Skin Integrity: Goal: Risk for impaired skin integrity will decrease Outcome: Progressing   Problem: Clinical Measurements: Goal: Ability to avoid or minimize complications of infection will improve Outcome: Progressing   Problem: Skin Integrity: Goal: Skin integrity will improve Outcome: Progressing   Problem: Education: Goal: Ability to describe self-care measures that may prevent or decrease complications (Diabetes Survival Skills Education) will improve Outcome: Progressing Goal: Individualized Educational  Video(s) Outcome: Progressing   Problem: Coping: Goal: Ability to adjust to condition or change in health will improve Outcome: Progressing   Problem: Fluid Volume: Goal: Ability to maintain a balanced intake and output will improve Outcome: Progressing   Problem: Health Behavior/Discharge Planning: Goal: Ability to identify and utilize available resources and services will improve Outcome: Progressing Goal: Ability to manage health-related needs will improve Outcome: Progressing   Problem: Metabolic: Goal: Ability to maintain appropriate glucose levels will improve Outcome: Progressing   Problem: Nutritional: Goal: Maintenance of adequate nutrition will improve Outcome: Progressing Goal: Progress toward achieving an optimal weight will improve Outcome: Progressing   Problem: Skin Integrity: Goal: Risk for impaired skin integrity will decrease Outcome: Progressing   Problem: Tissue Perfusion: Goal: Adequacy of tissue perfusion will improve Outcome: Progressing   Problem: Clinical Measurements: Goal: Will remain free from infection Outcome: Not Progressing

## 2023-12-09 NOTE — Progress Notes (Signed)
 [facility]  ROUNDING NOTE   Subjective:  Patient well-known to us  as we have seen her in the past for chronic kidney disease. Back in March her EGFR was 40. More recently renal function has been worse. Most recent EGFR was 20. Comes in with cellulitis in the left lower extremity near the hip. Patient continues to have elevated potassium. Counseled on diet and placed on renal diet. No distress noted.   Objective:  Vital signs in last 24 hours:  Temp:  [97.9 F (36.6 C)-98.7 F (37.1 C)] 98.3 F (36.8 C) (06/14 0734) Pulse Rate:  [62-105] 105 (06/14 0734) Resp:  [15-19] 17 (06/14 0734) BP: (124-166)/(40-66) 163/66 (06/14 0734) SpO2:  [97 %-100 %] 100 % (06/14 0734) Weight:  [122.6 kg] 122.6 kg (06/13 1411)  Weight change:  Filed Weights   12/08/23 1411  Weight: 122.6 kg    Intake/Output: I/O last 3 completed shifts: In: 506.8 [P.O.:257; IV Piggyback:249.8] Out: 550 [Urine:550]   Intake/Output this shift:  No intake/output data recorded.  Physical Exam: General: Large body habitus  Head: Normocephalic, atraumatic. Moist oral mucosal membranes  Eyes: Anicteric, PERRL  Neck: Supple, trachea midline  Lungs:  Diminished to auscultation  Heart: Regular rate and rhythm  Abdomen:  Soft, nontender,   Extremities: BLE wrapped due to wounds  Neurologic: Nonfocal, moving all four extremities  Skin: No lesions  Access: None    Basic Metabolic Panel: Recent Labs  Lab 12/08/23 1140 12/09/23 0216  NA 136 136  K 6.4* 5.9*  CL 106 106  CO2 20* 22  GLUCOSE 154* 149*  BUN 28* 29*  CREATININE 2.49* 2.55*  CALCIUM  9.0 8.9  PHOS  --  5.3*    Liver Function Tests: Recent Labs  Lab 12/08/23 1140  AST 23  ALT 12  ALKPHOS 81  BILITOT 0.5  PROT 7.2  ALBUMIN 2.4*   No results for input(s): LIPASE, AMYLASE in the last 168 hours. No results for input(s): AMMONIA in the last 168 hours.  CBC: Recent Labs  Lab 12/08/23 1140 12/09/23 0216  WBC  10.3 8.5  HGB 9.7* 9.7*  HCT 31.7* 32.3*  MCV 89.5 90.7  PLT 397 392    Cardiac Enzymes: Recent Labs  Lab 12/08/23 1706  CKTOTAL 16*    BNP: Invalid input(s): POCBNP  CBG: Recent Labs  Lab 12/08/23 2138 12/09/23 0736  GLUCAP 157* 114*    Microbiology: Results for orders placed or performed during the hospital encounter of 12/08/23  MRSA Next Gen by PCR, Nasal     Status: None   Collection Time: 12/08/23  2:23 PM   Specimen: Nasal Mucosa; Nasal Swab  Result Value Ref Range Status   MRSA by PCR Next Gen NOT DETECTED NOT DETECTED Final    Comment: (NOTE) The GeneXpert MRSA Assay (FDA approved for NASAL specimens only), is one component of a comprehensive MRSA colonization surveillance program. It is not intended to diagnose MRSA infection nor to guide or monitor treatment for MRSA infections. Test performance is not FDA approved in patients less than 67 years old. Performed at [facility], [address] [place], [place] [address]     Coagulation Studies: No results for input(s): LABPROT, INR in the last 72 hours.  Urinalysis: No results for input(s): COLORURINE, LABSPEC, PHURINE, GLUCOSEU, HGBUR, BILIRUBINUR, KETONESUR, PROTEINUR, UROBILINOGEN, NITRITE, LEUKOCYTESUR in the last 72 hours.  Invalid input(s): APPERANCEUR    Imaging: US  RENAL Result Date: 12/08/2023 CLINICAL DATA:  [id number] AKI (acute kidney injury) (HCC)  [id number]. EXAM: RENAL / URINARY TRACT ULTRASOUND COMPLETE COMPARISON:  None Available. FINDINGS: Examination is extremely limited due to patient's body habitus, limited patient mobility and cooperation. Examination was interrupted before the left kidney was evaluated. Right Kidney: Renal measurements: 4.1 x 4.3 x 7.9 cm = volume: 72.0 mL. Markedly limited evaluation. There is probable 3 cm cyst in the right kidney. Left Kidney: Not evaluated. Bladder: Not visualized and therefore not evaluated. Other: None.  IMPRESSION: *Extremely limited exam.  Please see above for details. Electronically Signed   By: Beula Brunswick M.D.   On: 12/08/2023 18:12     Medications:    cefTRIAXone  (ROCEPHIN )  IV 2 g (12/09/23 1045)    apixaban   5 mg Oral BID   ascorbic acid   500 mg Oral Daily   clopidogrel   75 mg Oral Q breakfast   ferrous gluconate   324 mg Oral Q breakfast   gabapentin   300 mg Oral QHS   insulin  aspart  0-5 Units Subcutaneous QHS   insulin  aspart  0-9 Units Subcutaneous TID WC   insulin  glargine-yfgn  14 Units Subcutaneous BID   levothyroxine   200 mcg Oral QAC breakfast   multivitamin with minerals  1 tablet Oral Daily   patiromer  8.4 g Oral Daily   senna-docusate  2 tablet Oral QHS   sodium zirconium cyclosilicate   10 g Oral Once   vancomycin  variable dose per unstable renal function (pharmacist dosing)   Does not apply See admin instructions   acetaminophen  **OR** acetaminophen , bisacodyl , hydrALAZINE , hydrOXYzine , morphine  injection, morphine  injection, ondansetron  **OR** ondansetron  (ZOFRAN ) IV, oxyCODONE , senna-docusate, zinc  oxide  Assessment/ Plan:  [name] is a 68 y.o.  female with past medical history of chronic bilateral lower extremity wounds, left fibula osteomyelitis, chronic systolic heart failure ejection fraction 45%, chronic lymphedema, hypertension, diabetes mellitus type 2, chronic kidney disease stage IIIb who presented with left lower extremity cellulitis and found to have significant hyperkalemia.   1.  Acute kidney injury/chronic kidney disease stage IIIb baseline creatinine 1.4 with EGFR 40 on 09/12/2023.  More recently renal function has been significantly worse.  eGFR currently 20 now.  Could be related to concurrent infection. Hold off on hydration given underlying lymphedema. Renal ultrasound incomplete, will need repeat to check for obstruction.   Lab Results  Component Value Date   CREATININE 2.55 (H) 12/09/2023   CREATININE 2.49 (H) 12/08/2023    CREATININE 3.07 (H) 10/02/2023     Intake/Output Summary (Last 24 hours) at 12/09/2023 1138 Last data filed at 12/09/2023 0518 Gross per 24 hour  Intake 506.78 ml  Output 550 ml  Net -43.22 ml      2.  Hyperkalemia.  Patient has been given both Lokelma  and Veltassa.  Continue to trend serum potassium.  No immediate need for dialysis at the moment. Placed on renal diet. Counseled on diet.   3.  Anemia of chronic kidney disease.  Hemoglobin stable at 9.7.  No immediate need for Epogen but may need to consider this as an outpatient.    LOS: 0 Keian Odriscoll P Henry Loge 6/14/202511:37 AM

## 2023-12-10 DIAGNOSIS — L03116 Cellulitis of left lower limb: Secondary | ICD-10-CM | POA: Diagnosis not present

## 2023-12-10 LAB — RENAL FUNCTION PANEL
Albumin: 2.4 g/dL — ABNORMAL LOW (ref 3.5–5.0)
Anion gap: 10 (ref 5–15)
BUN: 27 mg/dL — ABNORMAL HIGH (ref 8–23)
CO2: 17 mmol/L — ABNORMAL LOW (ref 22–32)
Calcium: 9 mg/dL (ref 8.9–10.3)
Chloride: 109 mmol/L (ref 98–111)
Creatinine, Ser: 2.07 mg/dL — ABNORMAL HIGH (ref 0.44–1.00)
GFR, Estimated: 25 mL/min — ABNORMAL LOW (ref >60)
Glucose, Bld: 132 mg/dL — ABNORMAL HIGH (ref 70–99)
Phosphorus: 3.7 mg/dL (ref 2.5–4.6)
Potassium: 5.2 mmol/L — ABNORMAL HIGH (ref 3.5–5.1)
Sodium: 136 mmol/L (ref 135–145)

## 2023-12-10 LAB — GLUCOSE, CAPILLARY
Glucose-Capillary: 138 mg/dL — ABNORMAL HIGH (ref 70–99)
Glucose-Capillary: 105 mg/dL — ABNORMAL HIGH (ref 70–99)
Glucose-Capillary: 131 mg/dL — ABNORMAL HIGH (ref 70–99)
Glucose-Capillary: 127 mg/dL — ABNORMAL HIGH (ref 70–99)

## 2023-12-10 LAB — BASIC METABOLIC PANEL WITH GFR
Anion gap: 9 (ref 5–15)
BUN: 28 mg/dL — ABNORMAL HIGH (ref 8–23)
CO2: 20 mmol/L — ABNORMAL LOW (ref 22–32)
Calcium: 9.5 mg/dL (ref 8.9–10.3)
Chloride: 109 mmol/L (ref 98–111)
Creatinine, Ser: 2.12 mg/dL — ABNORMAL HIGH (ref 0.44–1.00)
GFR, Estimated: 25 mL/min — ABNORMAL LOW (ref >60)
Glucose, Bld: 127 mg/dL — ABNORMAL HIGH (ref 70–99)
Potassium: 5.2 mmol/L — ABNORMAL HIGH (ref 3.5–5.1)
Sodium: 138 mmol/L (ref 135–145)

## 2023-12-10 LAB — VANCOMYCIN, RANDOM: Vancomycin Rm: 16 ug/mL

## 2023-12-10 MED ORDER — LABETALOL HCL 5 MG/ML IV SOLN
10.0000 mg | INTRAVENOUS | Status: DC | PRN
  Administered 2023-12-11: 10 mg via INTRAVENOUS
  Filled 2023-12-10: qty 4

## 2023-12-10 MED ORDER — CALCIUM CARBONATE ANTACID 500 MG PO CHEW
1.0000 | CHEWABLE_TABLET | Freq: Three times a day (TID) | ORAL | Status: DC | PRN
  Administered 2023-12-10 – 2023-12-15 (×2): 400 mg via ORAL
  Filled 2023-12-10 (×2): qty 2

## 2023-12-10 MED ORDER — SODIUM CHLORIDE 0.9 % IV SOLN
12.5000 mg | Freq: Four times a day (QID) | INTRAVENOUS | Status: DC | PRN
  Administered 2023-12-14 – 2023-12-15 (×2): 12.5 mg via INTRAVENOUS
  Filled 2023-12-10: qty 0.5
  Filled 2023-12-10 (×2): qty 12.5

## 2023-12-10 MED ORDER — VANCOMYCIN HCL 1500 MG/300ML IV SOLN
1500.0000 mg | INTRAVENOUS | Status: DC
  Administered 2023-12-10 – 2023-12-12 (×2): 1500 mg via INTRAVENOUS
  Filled 2023-12-10 (×2): qty 300

## 2023-12-10 MED ORDER — HYDRALAZINE HCL 20 MG/ML IJ SOLN
5.0000 mg | Freq: Four times a day (QID) | INTRAMUSCULAR | Status: AC | PRN
  Administered 2023-12-11: 5 mg via INTRAVENOUS
  Filled 2023-12-10: qty 1

## 2023-12-10 MED ORDER — OXYCODONE HCL 5 MG PO TABS
5.0000 mg | ORAL_TABLET | Freq: Four times a day (QID) | ORAL | Status: DC | PRN
  Administered 2023-12-10 – 2023-12-15 (×7): 5 mg via ORAL
  Filled 2023-12-10 (×7): qty 1

## 2023-12-10 NOTE — Progress Notes (Signed)
 [facility]  ROUNDING NOTE   Subjective:  No acute events overnight. Patient resting in bed, easily aroused. No complaints to offer.    Objective:  Vital signs in last 24 hours:  Temp:  [98.2 F (36.8 C)-99.7 F (37.6 C)] 99.7 F (37.6 C) (06/15 0820) Pulse Rate:  [97-119] 104 (06/15 0820) Resp:  [16-20] 17 (06/15 0820) BP: (120-191)/(60-70) 166/68 (06/15 0820) SpO2:  [98 %-100 %] 99 % (06/15 0820)  Weight change:  Filed Weights   12/08/23 1411  Weight: 122.6 kg    Intake/Output: I/O last 3 completed shifts: In: 257 [P.O.:257] Out: 1650 [Urine:1650]   Intake/Output this shift:  Total I/O In: -  Out: 500 [Urine:500]  Physical Exam: General: Large body habitus  Head: Normocephalic, atraumatic. Moist oral mucosal membranes  Eyes: Anicteric, PERRL  Neck: Supple, trachea midline  Lungs:  Diminished to auscultation  Heart: Regular rate and rhythm  Abdomen:  Soft, nontender,   Extremities:  +1 peripheral edema.  Neurologic: Nonfocal, moving all four extremities  Skin: BLLE wrapped for wounds  Access: None    Basic Metabolic Panel: Recent Labs  Lab 12/08/23 1140 12/09/23 0216 12/10/23 0407 12/10/23 0807  NA 136 136 138 136  K 6.4* 5.9* 5.2* 5.2*  CL 106 106 109 109  CO2 20* 22 20* 17*  GLUCOSE 154* 149* 127* 132*  BUN 28* 29* 28* 27*  CREATININE 2.49* 2.55* 2.12* 2.07*  CALCIUM  9.0 8.9 9.5 9.0  PHOS  --  5.3*  --  3.7    Liver Function Tests: Recent Labs  Lab 12/08/23 1140 12/10/23 0807  AST 23  --   ALT 12  --   ALKPHOS 81  --   BILITOT 0.5  --   PROT 7.2  --   ALBUMIN 2.4* 2.4*   No results for input(s): LIPASE, AMYLASE in the last 168 hours. No results for input(s): AMMONIA in the last 168 hours.  CBC: Recent Labs  Lab 12/08/23 1140 12/09/23 0216  WBC 10.3 8.5  HGB 9.7* 9.7*  HCT 31.7* 32.3*  MCV 89.5 90.7  PLT 397 392    Cardiac Enzymes: Recent Labs  Lab 12/08/23 1706  CKTOTAL 16*    BNP: Invalid  input(s): POCBNP  CBG: Recent Labs  Lab 12/09/23 1144 12/09/23 1718 12/09/23 2116 12/10/23 0748 12/10/23 1134  GLUCAP 144* 157* 144* 138* 105*    Microbiology: Results for orders placed or performed during the hospital encounter of 12/08/23  MRSA Next Gen by PCR, Nasal     Status: None   Collection Time: 12/08/23  2:23 PM   Specimen: Nasal Mucosa; Nasal Swab  Result Value Ref Range Status   MRSA by PCR Next Gen NOT DETECTED NOT DETECTED Final    Comment: (NOTE) The GeneXpert MRSA Assay (FDA approved for NASAL specimens only), is one component of a comprehensive MRSA colonization surveillance program. It is not intended to diagnose MRSA infection nor to guide or monitor treatment for MRSA infections. Test performance is not FDA approved in patients less than 57 years old. Performed at [facility], [address] [place], [place] [address]     Coagulation Studies: No results for input(s): LABPROT, INR in the last 72 hours.  Urinalysis: No results for input(s): COLORURINE, LABSPEC, PHURINE, GLUCOSEU, HGBUR, BILIRUBINUR, KETONESUR, PROTEINUR, UROBILINOGEN, NITRITE, LEUKOCYTESUR in the last 72 hours.  Invalid input(s): APPERANCEUR    Imaging: US  RENAL Result Date: 12/08/2023 CLINICAL DATA:  [id number] AKI (acute kidney injury) (HCC) [id number]. EXAM: RENAL /  URINARY TRACT ULTRASOUND COMPLETE COMPARISON:  None Available. FINDINGS: Examination is extremely limited due to patient's body habitus, limited patient mobility and cooperation. Examination was interrupted before the left kidney was evaluated. Right Kidney: Renal measurements: 4.1 x 4.3 x 7.9 cm = volume: 72.0 mL. Markedly limited evaluation. There is probable 3 cm cyst in the right kidney. Left Kidney: Not evaluated. Bladder: Not visualized and therefore not evaluated. Other: None. IMPRESSION: *Extremely limited exam.  Please see above for details. Electronically Signed   By: Beula Brunswick  M.D.   On: 12/08/2023 18:12     Medications:    cefTRIAXone  (ROCEPHIN )  IV 2 g (12/10/23 1115)   vancomycin       apixaban   5 mg Oral BID   ascorbic acid   500 mg Oral Daily   clopidogrel   75 mg Oral Q breakfast   ferrous gluconate   324 mg Oral Q breakfast   gabapentin   300 mg Oral QHS   insulin  aspart  0-5 Units Subcutaneous QHS   insulin  aspart  0-9 Units Subcutaneous TID WC   insulin  glargine-yfgn  14 Units Subcutaneous BID   levothyroxine   200 mcg Oral QAC breakfast   multivitamin with minerals  1 tablet Oral Daily   patiromer  8.4 g Oral Daily   senna-docusate  2 tablet Oral QHS   acetaminophen  **OR** acetaminophen , bisacodyl , hydrALAZINE , hydrOXYzine , labetalol , ondansetron  **OR** ondansetron  (ZOFRAN ) IV, oxyCODONE , senna-docusate, zinc  oxide  Assessment/ Plan:  [name] is a 68 y.o.  female  with past medical history of chronic bilateral lower extremity wounds, left fibula osteomyelitis, chronic systolic heart failure ejection fraction 45%, chronic lymphedema, hypertension, diabetes mellitus type 2, chronic kidney disease stage IIIb who presented with left lower extremity cellulitis and found to have significant hyperkalemia.   1.  Acute kidney injury/chronic kidney disease stage IIIb baseline creatinine 1.4 with EGFR 40 on 09/12/2023.  More recently renal function has been significantly worse.  eGFR currently 20 now.  Could be related to concurrent infection. Hold off on hydration given underlying lymphedema. Renal ultrasound incomplete, will need repeat to check for obstruction.    Lab Results  Component Value Date   CREATININE 2.07 (H) 12/10/2023   CREATININE 2.12 (H) 12/10/2023   CREATININE 2.55 (H) 12/09/2023     Intake/Output Summary (Last 24 hours) at 12/10/2023 1158 Last data filed at 12/10/2023 1146 Gross per 24 hour  Intake --  Output 1600 ml  Net -1600 ml      2.  Hyperkalemia, improved to 5.2 Continue renal diet. Continue Valtessa daily   3.   Anemia of chronic kidney disease.  Hemoglobin stable at 9.7.  No immediate need for Epogen but may need to consider this as an outpatient.       LOS: 1 Chasey Dull Marisa Sickles 6/15/202511:57 AM

## 2023-12-10 NOTE — Progress Notes (Signed)
 Pharmacy Antibiotic Note  [name] is a 68 y.o. female admitted on 12/08/2023 with cellulitis.  Pharmacy has been consulted for Vancomcyin dosing.  -Scr improving  (baseline Scr 1.4 per MD note) -also on Ceftriaxone   Plan: -Vancomcyin 2000 mg loading dose given 6/13@ 1531 -Patient with acute kidney injury on chronic kidney disease,  --Vancomycin  random level  6/15@ 1610= 16 mc/ml -- will order Vancomycin  1500 mg IV Q 48 hrs for now. Goal AUC 400-550. Expected AUC: 535 Cmin 12.7 SCr used: 2.12 IBW,  Vd 0.5   BMI 44  Continue to assess renal fxn, cultures. Length of tx, etc   Height: 5' 5 (165.1 cm) Weight: 122.6 kg (270 lb 4.5 oz) IBW/kg (Calculated) : 57  Temp (24hrs), Avg:98.8 F (37.1 C), Min:98.2 F (36.8 C), Max:99.7 F (37.6 C)  Recent Labs  Lab 12/08/23 1140 12/09/23 0216 12/10/23 0407  WBC 10.3 8.5  --   CREATININE 2.49* 2.55* 2.12*  LATICACIDVEN 1.4  --   --   VANCORANDOM  --   --  16    Estimated Creatinine Clearance: 32.4 mL/min (A) (by C-G formula based on SCr of 2.12 mg/dL (H)).    Allergies  Allergen Reactions   Cefepime  Other (See Comments)    Encephalopathy -- precipitated by AKI and opioids    Antimicrobials this admission: Ceftriaxone   6/13 >>  Vancomycin  6/13 >>   Dose adjustments this admission:  Microbiology results:   Thank you for allowing pharmacy to be a part of this patient's care.  Thomasine Flick PharmD Clinical Pharmacist 12/10/2023

## 2023-12-10 NOTE — Plan of Care (Signed)
  Problem: Education: Goal: Knowledge of General Education information will improve Description: Including pain rating scale, medication(s)/side effects and non-pharmacologic comfort measures Outcome: Progressing   Problem: Health Behavior/Discharge Planning: Goal: Ability to manage health-related needs will improve Outcome: Progressing   Problem: Clinical Measurements: Goal: Ability to maintain clinical measurements within normal limits will improve Outcome: Progressing Goal: Will remain free from infection Outcome: Progressing Goal: Diagnostic test results will improve Outcome: Progressing Goal: Respiratory complications will improve Outcome: Progressing Goal: Cardiovascular complication will be avoided Outcome: Progressing   Problem: Nutrition: Goal: Adequate nutrition will be maintained Outcome: Progressing   Problem: Activity: Goal: Risk for activity intolerance will decrease Outcome: Progressing   Problem: Coping: Goal: Level of anxiety will decrease Outcome: Progressing   Problem: Elimination: Goal: Will not experience complications related to bowel motility Outcome: Progressing Goal: Will not experience complications related to urinary retention Outcome: Progressing   Problem: Skin Integrity: Goal: Risk for impaired skin integrity will decrease Outcome: Progressing   Problem: Safety: Goal: Ability to remain free from injury will improve Outcome: Progressing   Problem: Clinical Measurements: Goal: Ability to avoid or minimize complications of infection will improve Outcome: Progressing   Problem: Skin Integrity: Goal: Skin integrity will improve Outcome: Progressing   Problem: Education: Goal: Ability to describe self-care measures that may prevent or decrease complications (Diabetes Survival Skills Education) will improve Outcome: Progressing Goal: Individualized Educational Video(s) Outcome: Progressing   Problem: Coping: Goal: Ability to adjust  to condition or change in health will improve Outcome: Progressing   Problem: Fluid Volume: Goal: Ability to maintain a balanced intake and output will improve Outcome: Progressing   Problem: Health Behavior/Discharge Planning: Goal: Ability to identify and utilize available resources and services will improve Outcome: Progressing Goal: Ability to manage health-related needs will improve Outcome: Progressing   Problem: Skin Integrity: Goal: Risk for impaired skin integrity will decrease Outcome: Progressing   Problem: Nutritional: Goal: Maintenance of adequate nutrition will improve Outcome: Progressing Goal: Progress toward achieving an optimal weight will improve Outcome: Progressing   Problem: Tissue Perfusion: Goal: Adequacy of tissue perfusion will improve Outcome: Progressing

## 2023-12-10 NOTE — Progress Notes (Addendum)
  Progress Note   Patient: [name] UEA:[id number] DOB: 06-01-1956 DOA: 12/08/2023     1 DOS: the patient was seen and examined on 12/10/2023   Brief hospital course: 68 year old female with history of hypertension, insulin -dependent diabetes mellitus, hypothyroid, CKD stage IV, who presents to the emergency department for chief concerns of worsening cellulitis of the left lower leg and thigh.   See H&P for full HPI on admission & ED course.  Patient was admitted 12/08/23 and started on IV antibiotics with left leg cellulitis as outlined in detail below.    Assessment and Plan:  * Cellulitis of left lower extremity --Continue Rocephin  & Vancomycin  --Monitor clinically for improvement --Follow blood cultures  Hyperkalemia --Nephrology consulted --Veltassa and Lokelma   --Monitor BMP's  Nausea and Vomiting - unclear cause, intermittent, no abdominal pain --PRN anti-emetics  Bilateral leg pain --Resumed home oxycodone  --Morphine  for breakthrough  Type II diabetes mellitus with renal manifestations (HCC) --Home dose basal 14 units BID --Sliding scale Novolog   Essential hypertension --Home meds not resumed yet, BP's labile, soft DBP's --Med rec still pending --IV hydralazine  PRN  Hypothyroidism --Continue levothyroxine    Obesity, Class III, BMI 40-49.9 (morbid obesity) Complicates overall care and prognosis.  Recommend lifestyle modifications including physical activity and diet for weight loss and overall long-term health.      Subjective: Pt awake resting in bed on rounds this AM. Reports severe pain on upper left lateral thigh, asks me not to touch it.  When I gently feel for warmth of the area, pt winces.  No fever/chills.  Reports nausea with vomiting x 2 this AM, currently still nauseated.  Not sure why she's nauseous, denies abdominal pain.  No fever/chills.    Physical Exam: Vitals:   12/10/23 0747 12/10/23 0820 12/10/23 1428 12/10/23 1446  BP: (!)  190/70 (!) 166/68 (!) 171/71 (!) 147/92  Pulse: (!) 119 (!) 104 (!) 117 (!) 107  Resp: 18 17 19 18   Temp: 98.5 F (36.9 C) 99.7 F (37.6 C) 98.9 F (37.2 C) 98.8 F (37.1 C)  TempSrc:  Oral Oral Oral  SpO2: 99% 99% 97% 98%  Weight:      Height:       General exam: awake, alert, no acute distress, morbidly obese HEENT: moist mucus membranes, hearing grossly normal  Respiratory system: CTAB diminished due to body habitus, no wheezes, rales or rhonchi, normal respiratory effort. Cardiovascular system: normal S1/S2, RRR, BLE in wraps.   Gastrointestinal system: soft, NT, ND, no HSM felt, +bowel sounds. Central nervous system: A&O x 3. no gross focal neurologic deficits, normal speech Extremities: proximal left lateral thigh area of faint erythema no differential warmth but area is tender on light palpation, b/l distal LE's in wraps, multiple toe amputations Skin: dry, intact, normal temperature Psychiatry: normal mood, congruent affect, judgement and insight appear normal   Data Reviewed:  Notable labs --  K 6.4 >> 5.9 >> 5.2 >> 5.2 Glucose 132 BUN 27 Cr 2.07   Last Hbg 9.7    Family Communication: none. Pt updated in detail.  Disposition: Status is: Inpatient Remains inpatient appropriate because: remains on IV antibiotics pending further clinical improvement   Planned Discharge Destination: Skilled nursing facility    Time spent: 38 minutes  Author: Montey Apa, DO 12/10/2023 3:54 PM  For on call review [email or web address].

## 2023-12-11 DIAGNOSIS — L03116 Cellulitis of left lower limb: Secondary | ICD-10-CM | POA: Diagnosis not present

## 2023-12-11 LAB — RENAL FUNCTION PANEL
Albumin: 2.4 g/dL — ABNORMAL LOW (ref 3.5–5.0)
Anion gap: 11 (ref 5–15)
BUN: 23 mg/dL (ref 8–23)
CO2: 19 mmol/L — ABNORMAL LOW (ref 22–32)
Calcium: 9.3 mg/dL (ref 8.9–10.3)
Chloride: 110 mmol/L (ref 98–111)
Creatinine, Ser: 1.77 mg/dL — ABNORMAL HIGH (ref 0.44–1.00)
GFR, Estimated: 31 mL/min — ABNORMAL LOW (ref >60)
Glucose, Bld: 153 mg/dL — ABNORMAL HIGH (ref 70–99)
Phosphorus: 3.2 mg/dL (ref 2.5–4.6)
Potassium: 5 mmol/L (ref 3.5–5.1)
Sodium: 140 mmol/L (ref 135–145)

## 2023-12-11 LAB — GLUCOSE, CAPILLARY
Glucose-Capillary: 134 mg/dL — ABNORMAL HIGH (ref 70–99)
Glucose-Capillary: 102 mg/dL — ABNORMAL HIGH (ref 70–99)
Glucose-Capillary: 117 mg/dL — ABNORMAL HIGH (ref 70–99)
Glucose-Capillary: 169 mg/dL — ABNORMAL HIGH (ref 70–99)

## 2023-12-11 MED ORDER — HYDROMORPHONE HCL 1 MG/ML IJ SOLN
0.5000 mg | Freq: Every day | INTRAMUSCULAR | Status: DC | PRN
  Administered 2023-12-11 – 2023-12-12 (×2): 0.5 mg via INTRAVENOUS
  Filled 2023-12-11 (×3): qty 0.5

## 2023-12-11 NOTE — Progress Notes (Signed)
   12/11/23 0904  Vitals  Temp 98.9 F (37.2 C)  BP (!) 158/66  MAP (mmHg) 91  BP Location Right Arm  BP Method Automatic  Patient Position (if appropriate) Lying  Pulse Rate (!) 118  Pulse Rate Source Monitor  Resp 17  Level of Consciousness  Level of Consciousness Alert  MEWS COLOR  MEWS Score Color Yellow  Oxygen Therapy  SpO2 100 %  O2 Device Room Air  MEWS Score  MEWS Temp 0  MEWS Systolic 0  MEWS Pulse 2  MEWS RR 0  MEWS LOC 0  MEWS Score 2   BP and HR elevated; PRN Labetelol given HR now 101.  Plan of care continues.

## 2023-12-11 NOTE — Plan of Care (Signed)
  Problem: Education: Goal: Knowledge of General Education information will improve Description: Including pain rating scale, medication(s)/side effects and non-pharmacologic comfort measures Outcome: Progressing   Problem: Coping: Goal: Level of anxiety will decrease Outcome: Progressing   Problem: Elimination: Goal: Will not experience complications related to bowel motility Outcome: Progressing   Problem: Safety: Goal: Ability to remain free from injury will improve Outcome: Progressing   Problem: Skin Integrity: Goal: Risk for impaired skin integrity will decrease Outcome: Progressing   

## 2023-12-11 NOTE — Progress Notes (Signed)
 [facility]  ROUNDING NOTE   Subjective:  No acute events overnight. Patient resting in bed, easily aroused. No complaints to offer.  Reports nausea yesterday evening.  Not hungry today.  Objective:  Vital signs in last 24 hours:  Temp:  [98.1 F (36.7 C)-98.9 F (37.2 C)] 98.9 F (37.2 C) (06/16 0904) Pulse Rate:  [101-118] 101 (06/16 0921) Resp:  [17-19] 17 (06/16 0904) BP: (147-185)/(64-92) 158/66 (06/16 0904) SpO2:  [97 %-100 %] 100 % (06/16 0904)  Weight change:  Filed Weights   12/08/23 1411  Weight: 122.6 kg    Intake/Output: I/O last 3 completed shifts: In: 660.1 [P.O.:120; IV Piggyback:540.1] Out: 2000 [Urine:2000]   Intake/Output this shift:  No intake/output data recorded.  Physical Exam: General: Large body habitus  Head: Normocephalic, atraumatic. Moist oral mucosal membranes  Eyes: Anicteric,    Lungs:  Diminished to auscultation  Heart: Regular rate and rhythm  Abdomen:  Soft, nontender,   Extremities:  +1 dependent peripheral edema.  Neurologic: Nonfocal, moving all four extremities  Skin: BLLE wrapped for wounds  Access: None    Basic Metabolic Panel: Recent Labs  Lab 12/08/23 1140 12/09/23 0216 12/10/23 0407 12/10/23 0807 12/11/23 0147  NA 136 136 138 136 140  K 6.4* 5.9* 5.2* 5.2* 5.0  CL 106 106 109 109 110  CO2 20* 22 20* 17* 19*  GLUCOSE 154* 149* 127* 132* 153*  BUN 28* 29* 28* 27* 23  CREATININE 2.49* 2.55* 2.12* 2.07* 1.77*  CALCIUM  9.0 8.9 9.5 9.0 9.3  PHOS  --  5.3*  --  3.7 3.2    Liver Function Tests: Recent Labs  Lab 12/08/23 1140 12/10/23 0807 12/11/23 0147  AST 23  --   --   ALT 12  --   --   ALKPHOS 81  --   --   BILITOT 0.5  --   --   PROT 7.2  --   --   ALBUMIN 2.4* 2.4* 2.4*   No results for input(s): LIPASE, AMYLASE in the last 168 hours. No results for input(s): AMMONIA in the last 168 hours.  CBC: Recent Labs  Lab 12/08/23 1140 12/09/23 0216  WBC 10.3 8.5  HGB 9.7* 9.7*   HCT 31.7* 32.3*  MCV 89.5 90.7  PLT 397 392    Cardiac Enzymes: Recent Labs  Lab 12/08/23 1706  CKTOTAL 16*    BNP: Invalid input(s): POCBNP  CBG: Recent Labs  Lab 12/10/23 1134 12/10/23 1711 12/10/23 2308 12/11/23 0742 12/11/23 1212  GLUCAP 105* 131* 127* 134* 102*    Microbiology: Results for orders placed or performed during the hospital encounter of 12/08/23  MRSA Next Gen by PCR, Nasal     Status: None   Collection Time: 12/08/23  2:23 PM   Specimen: Nasal Mucosa; Nasal Swab  Result Value Ref Range Status   MRSA by PCR Next Gen NOT DETECTED NOT DETECTED Final    Comment: (NOTE) The GeneXpert MRSA Assay (FDA approved for NASAL specimens only), is one component of a comprehensive MRSA colonization surveillance program. It is not intended to diagnose MRSA infection nor to guide or monitor treatment for MRSA infections. Test performance is not FDA approved in patients less than 37 years old. Performed at [facility], [address] [place], [place] [address]     Coagulation Studies: No results for input(s): LABPROT, INR in the last 72 hours.  Urinalysis: No results for input(s): COLORURINE, LABSPEC, PHURINE, GLUCOSEU, HGBUR, BILIRUBINUR, KETONESUR, PROTEINUR, UROBILINOGEN, NITRITE,  LEUKOCYTESUR in the last 72 hours.  Invalid input(s): APPERANCEUR    Imaging: No results found.    Medications:    cefTRIAXone  (ROCEPHIN )  IV 2 g (12/11/23 0816)   promethazine  (PHENERGAN ) injection (IM or IVPB)     vancomycin  Stopped (12/10/23 1924)    apixaban   5 mg Oral BID   ascorbic acid   500 mg Oral Daily   clopidogrel   75 mg Oral Q breakfast   ferrous gluconate   324 mg Oral Q breakfast   gabapentin   300 mg Oral QHS   insulin  aspart  0-5 Units Subcutaneous QHS   insulin  aspart  0-9 Units Subcutaneous TID WC   insulin  glargine-yfgn  14 Units Subcutaneous BID   levothyroxine   200 mcg Oral QAC breakfast   multivitamin  with minerals  1 tablet Oral Daily   patiromer  8.4 g Oral Daily   senna-docusate  2 tablet Oral QHS   acetaminophen  **OR** acetaminophen , bisacodyl , calcium  carbonate, hydrALAZINE , hydrOXYzine , labetalol , ondansetron  **OR** ondansetron  (ZOFRAN ) IV, oxyCODONE , promethazine  (PHENERGAN ) injection (IM or IVPB), senna-docusate, zinc  oxide  Assessment/ Plan:  [name] is a 68 y.o.  female  with past medical history of chronic bilateral lower extremity wounds, left fibula osteomyelitis, chronic systolic heart failure ejection fraction 45%, chronic lymphedema, hypertension, diabetes mellitus type 2, chronic kidney disease stage IIIb who presented with left lower extremity cellulitis and found to have significant hyperkalemia.   1.  Acute kidney injury/chronic kidney disease stage IIIb baseline creatinine 1.4 with EGFR 40 on 09/12/2023.   Serum creatinine peaked at 4.02 but has since been improving and is down to 1.77 today. -Continue supportive care. -Treatment of cellulitis as per primary team.  Currently getting ceftriaxone  and vancomycin .   2.  Hyperkalemia, improved  Continue renal diet. Continue Valtessa daily   3.  Anemia of chronic kidney disease.  Hemoglobin stable at 9.7.  No immediate need for Epogen but may need to consider this as an outpatient.  4.  Dependent edema and lymphedema -Currently not on diuretics.  Continue to monitor.   LOS: 2 Nasira Janusz Zelda Hickman 6/16/202512:47 PM

## 2023-12-11 NOTE — Progress Notes (Addendum)
  Progress Note   Patient: [name] ZOX:[id number] DOB: [birth year] DOA: 12/08/2023     2 DOS: the patient was seen and examined on 12/11/2023   Brief hospital course: 68 year old female with history of hypertension, insulin -dependent diabetes mellitus, hypothyroid, CKD stage IV, who presents to the emergency department for chief concerns of worsening cellulitis of the left lower leg and thigh.   See H&P for full HPI on admission & ED course.  Patient was admitted 12/08/23 and started on IV antibiotics with left leg cellulitis as outlined in detail below.    Assessment and Plan:  * Cellulitis of left lower extremity Clinically undetermined if related to diabetes --Continue Rocephin  & Vancomycin  --Monitor clinically for improvement --Follow blood cultures  Hyperkalemia --Nephrology consulted --Veltassa and Lokelma   --Monitor BMP's  AKI superimposed on CKD stage IIIb Renal function improving.  Baseline Cr ~1.4 Today Cr improved to 1.77 --Nephrology following  Metabolic acidosis - due to renal failure --Mointor BMP  Nausea and Vomiting - unclear cause, intermittent, no abdominal pain --PRN anti-emetics  Bilateral leg pain --Resumed home oxycodone  --Morphine  for breakthrough  Type II diabetes mellitus with renal manifestations (HCC) --Home dose basal 14 units BID --Sliding scale Novolog   Essential hypertension --Home meds not resumed yet, BP's labile, soft DBP's --Med rec still pending --IV hydralazine  PRN  Hypothyroidism --Continue levothyroxine    Obesity, Class III, BMI 40-49.9 (morbid obesity) Complicates overall care and prognosis.  Recommend lifestyle modifications including physical activity and diet for weight loss and overall long-term health.      Subjective: Pt awake resting in bed on rounds this AM. Reports left thigh pain as similar to yesterday.  Denies fever/chills.  Denies other acute complaints.    Physical Exam: Vitals:   12/11/23 0534  12/11/23 0633 12/11/23 0904 12/11/23 0921  BP: (!) 179/77 (!) 154/64 (!) 158/66   Pulse: (!) 110  (!) 118 (!) 101  Resp: 18  17   Temp: 98.1 F (36.7 C)  98.9 F (37.2 C)   TempSrc:      SpO2: 99%  100%   Weight:      Height:       General exam: awake, alert, no acute distress, morbidly obese HEENT: moist mucus membranes, hearing grossly normal  Respiratory system: on room air, normal respiratory effort. Cardiovascular system: RRR, BLE in wraps.   Gastrointestinal system: soft, NT, ND Central nervous system: A&O x 3. no gross focal neurologic deficits, normal speech Extremities: proximal left lateral thigh area of faint erythema no differential warmth but area is tender on light palpation, b/l distal LE's in wraps, multiple toe amputations Skin: dry, intact, normal temperature Psychiatry: normal mood, congruent affect, judgement and insight appear normal   Data Reviewed:  Notable labs --  K 5.0 Glucose 153 Cr 2.07 >> 1.77 improving   Last Hbg 9.7    Family Communication: none. Pt updated in detail.  Disposition: Status is: Inpatient Remains inpatient appropriate because: remains on IV antibiotics pending further clinical improvement   Planned Discharge Destination: Skilled nursing facility    Time spent: 38 minutes  Author: Montey Apa, DO 12/11/2023 12:53 PM  For on call review [email or web address].

## 2023-12-12 DIAGNOSIS — L03116 Cellulitis of left lower limb: Secondary | ICD-10-CM | POA: Diagnosis not present

## 2023-12-12 LAB — BASIC METABOLIC PANEL WITH GFR
Anion gap: 7 (ref 5–15)
BUN: 21 mg/dL (ref 8–23)
CO2: 23 mmol/L (ref 22–32)
Calcium: 9.4 mg/dL (ref 8.9–10.3)
Chloride: 110 mmol/L (ref 98–111)
Creatinine, Ser: 1.69 mg/dL — ABNORMAL HIGH (ref 0.44–1.00)
GFR, Estimated: 32 mL/min — ABNORMAL LOW (ref >60)
Glucose, Bld: 131 mg/dL — ABNORMAL HIGH (ref 70–99)
Potassium: 4.6 mmol/L (ref 3.5–5.1)
Sodium: 140 mmol/L (ref 135–145)

## 2023-12-12 LAB — GLUCOSE, CAPILLARY
Glucose-Capillary: 123 mg/dL — ABNORMAL HIGH (ref 70–99)
Glucose-Capillary: 112 mg/dL — ABNORMAL HIGH (ref 70–99)
Glucose-Capillary: 149 mg/dL — ABNORMAL HIGH (ref 70–99)
Glucose-Capillary: 159 mg/dL — ABNORMAL HIGH (ref 70–99)

## 2023-12-12 MED ORDER — GENTAMICIN SULFATE 0.1 % EX OINT
1.0000 | TOPICAL_OINTMENT | Freq: Every day | CUTANEOUS | Status: DC
  Filled 2023-12-12: qty 15

## 2023-12-12 MED ORDER — METOPROLOL SUCCINATE ER 25 MG PO TB24
12.5000 mg | ORAL_TABLET | Freq: Every day | ORAL | Status: DC
  Administered 2023-12-12 – 2023-12-15 (×3): 12.5 mg via ORAL
  Filled 2023-12-12 (×3): qty 1

## 2023-12-12 MED ORDER — FUROSEMIDE 20 MG PO TABS
20.0000 mg | ORAL_TABLET | Freq: Two times a day (BID) | ORAL | Status: DC
  Administered 2023-12-12 – 2023-12-15 (×6): 20 mg via ORAL
  Filled 2023-12-12 (×6): qty 1

## 2023-12-12 MED ORDER — FLUTICASONE PROPIONATE 50 MCG/ACT NA SUSP
1.0000 | Freq: Every day | NASAL | Status: DC
  Administered 2023-12-12: 1 via NASAL
  Filled 2023-12-12: qty 16

## 2023-12-12 MED ORDER — DICLOFENAC SODIUM 1 % EX GEL
2.0000 g | Freq: Three times a day (TID) | CUTANEOUS | Status: DC
  Filled 2023-12-12 (×2): qty 100

## 2023-12-12 NOTE — TOC Progression Note (Signed)
 Transition of Care [facility]) - Progression Note    Patient Details  Name: [name] MRN: [id number] Date of Birth: [birth year]  [facility]) CM/SW Contact  Alexandra Ice, RN Phone Number: 12/12/2023, 4:04 PM  Clinical Narrative:    Met with patient at bedside, she confirms she is resident at [facility]. Stated she has lived there for 3 years.         Expected Discharge Plan and Services                                               Social Determinants of Health (SDOH) Interventions SDOH Screenings   Food Insecurity: No Food Insecurity (12/08/2023)  Housing: Low Risk  (12/08/2023)  Transportation Needs: No Transportation Needs (12/08/2023)  Utilities: Not At Risk (12/08/2023)  Alcohol Screen: Low Risk  (03/10/2021)  Depression (PHQ2-9): Low Risk  (03/10/2021)  Financial Resource Strain: Low Risk  (03/10/2021)  Physical Activity: Inactive (03/10/2021)  Social Connections: Socially Isolated (12/08/2023)  Stress: No Stress Concern Present (03/10/2021)  Tobacco Use: Low Risk  (12/08/2023)    Readmission Risk Interventions    11/03/2021   12:41 PM  Readmission Risk Prevention Plan  Transportation Screening Complete  PCP or Specialist Appt within 3-5 Days Complete  HRI or Home Care Consult Complete  Social Work Consult for Recovery Care Planning/Counseling Complete  Palliative Care Screening Complete  Medication Review [employer]) Complete

## 2023-12-12 NOTE — Care Management Important Message (Signed)
 Important Message  Patient Details  Name: [name] MRN: [id number] Date of Birth: 1955-11-09   Important Message Given:  Yes - Medicare IM     Katrinia Straker W, CMA 12/12/2023, 12:20 PM

## 2023-12-12 NOTE — Progress Notes (Signed)
 [facility]  ROUNDING NOTE   Subjective:  No acute events overnight. Patient resting in bed, easily aroused. No complaints to offer.    Objective:  Vital signs in last 24 hours:  Temp:  [98.2 F (36.8 C)-98.6 F (37 C)] 98.2 F (36.8 C) (06/17 0720) Pulse Rate:  [106-120] 108 (06/17 0737) Resp:  [18] 18 (06/17 0737) BP: (120-171)/(47-90) 137/90 (06/17 0737) SpO2:  [97 %-100 %] 100 % (06/17 0720)  Weight change:  Filed Weights   12/08/23 1411  Weight: 122.6 kg    Intake/Output: I/O last 3 completed shifts: In: 660.1 [P.O.:120; IV Piggyback:540.1] Out: 900 [Urine:900]   Intake/Output this shift:  No intake/output data recorded.  Physical Exam: General: Large body habitus  Head: Normocephalic, atraumatic. Moist oral mucosal membranes  Eyes: Anicteric,    Lungs:  Diminished to auscultation  Heart: Regular rate and rhythm  Abdomen:  Soft, nontender,   Extremities:  +1 dependent peripheral edema.  Neurologic: Nonfocal, moving all four extremities  Skin: BLLE wrapped for wounds    Basic Metabolic Panel: Recent Labs  Lab 12/09/23 0216 12/10/23 0407 12/10/23 0807 12/11/23 0147 12/12/23 0400  NA 136 138 136 140 140  K 5.9* 5.2* 5.2* 5.0 4.6  CL 106 109 109 110 110  CO2 22 20* 17* 19* 23  GLUCOSE 149* 127* 132* 153* 131*  BUN 29* 28* 27* 23 21  CREATININE 2.55* 2.12* 2.07* 1.77* 1.69*  CALCIUM  8.9 9.5 9.0 9.3 9.4  PHOS 5.3*  --  3.7 3.2  --     Liver Function Tests: Recent Labs  Lab 12/08/23 1140 12/10/23 0807 12/11/23 0147  AST 23  --   --   ALT 12  --   --   ALKPHOS 81  --   --   BILITOT 0.5  --   --   PROT 7.2  --   --   ALBUMIN 2.4* 2.4* 2.4*   No results for input(s): LIPASE, AMYLASE in the last 168 hours. No results for input(s): AMMONIA in the last 168 hours.  CBC: Recent Labs  Lab 12/08/23 1140 12/09/23 0216  WBC 10.3 8.5  HGB 9.7* 9.7*  HCT 31.7* 32.3*  MCV 89.5 90.7  PLT 397 392    Cardiac Enzymes: Recent  Labs  Lab 12/08/23 1706  CKTOTAL 16*    BNP: Invalid input(s): POCBNP  CBG: Recent Labs  Lab 12/11/23 1212 12/11/23 1616 12/11/23 2033 12/12/23 0803 12/12/23 1202  GLUCAP 102* 117* 169* 123* 112*    Microbiology: Results for orders placed or performed during the hospital encounter of 12/08/23  MRSA Next Gen by PCR, Nasal     Status: None   Collection Time: 12/08/23  2:23 PM   Specimen: Nasal Mucosa; Nasal Swab  Result Value Ref Range Status   MRSA by PCR Next Gen NOT DETECTED NOT DETECTED Final    Comment: (NOTE) The GeneXpert MRSA Assay (FDA approved for NASAL specimens only), is one component of a comprehensive MRSA colonization surveillance program. It is not intended to diagnose MRSA infection nor to guide or monitor treatment for MRSA infections. Test performance is not FDA approved in patients less than 26 years old. Performed at [facility], [address] [place], [place] [address]     Coagulation Studies: No results for input(s): LABPROT, INR in the last 72 hours.  Urinalysis: No results for input(s): COLORURINE, LABSPEC, PHURINE, GLUCOSEU, HGBUR, BILIRUBINUR, KETONESUR, PROTEINUR, UROBILINOGEN, NITRITE, LEUKOCYTESUR in the last 72 hours.  Invalid input(s): APPERANCEUR  Imaging: No results found.    Medications:    promethazine  (PHENERGAN ) injection (IM or IVPB)     vancomycin  Stopped (12/10/23 1924)    apixaban   5 mg Oral BID   ascorbic acid   500 mg Oral Daily   clopidogrel   75 mg Oral Q breakfast   diclofenac Sodium  2 g Topical TID   ferrous gluconate   324 mg Oral Q breakfast   fluticasone  1 spray Each Nare Daily   furosemide   20 mg Oral BID   gabapentin   300 mg Oral QHS   gentamicin ointment  1 Application Topical Daily   insulin  aspart  0-5 Units Subcutaneous QHS   insulin  aspart  0-9 Units Subcutaneous TID WC   insulin  glargine-yfgn  14 Units Subcutaneous BID   levothyroxine   200 mcg Oral  QAC breakfast   metoprolol  succinate  12.5 mg Oral Q breakfast   multivitamin with minerals  1 tablet Oral Daily   patiromer  8.4 g Oral Daily   senna-docusate  2 tablet Oral QHS   acetaminophen  **OR** acetaminophen , bisacodyl , calcium  carbonate, hydrALAZINE , HYDROmorphone  (DILAUDID ) injection, hydrOXYzine , labetalol , ondansetron  **OR** ondansetron  (ZOFRAN ) IV, oxyCODONE , promethazine  (PHENERGAN ) injection (IM or IVPB), senna-docusate, zinc  oxide  Assessment/ Plan:  [name] is a 68 y.o.  female  with past medical history of chronic bilateral lower extremity wounds, left fibula osteomyelitis, chronic systolic heart failure ejection fraction 45%, chronic lymphedema, hypertension, diabetes mellitus type 2, chronic kidney disease stage IIIb who presented with left lower extremity cellulitis and found to have significant hyperkalemia.   1.  Acute kidney injury/chronic kidney disease stage IIIb baseline creatinine 1.4 with EGFR 40 on 09/12/2023.   Serum creatinine peaked at 4.02 but has since been improving and is down to 1.69 today. -Continue supportive care. -Treatment of cellulitis as per primary team.  Currently getting vancomycin .  Completed ceftriaxone    2.  Hyperkalemia, improved  Continue renal diet. Continue Valtessa daily   3.  Anemia of chronic kidney disease.  Hemoglobin stable at 9.7 (6/14).  No immediate need for Epogen but may need to consider this as an outpatient.  4.  Dependent edema and lymphedema -Currently not on diuretics.  Continue to monitor.   LOS: 3 Biridiana Twardowski 6/17/20251:58 PM

## 2023-12-12 NOTE — Plan of Care (Signed)
°  Problem: Clinical Measurements: Goal: Will remain free from infection Outcome: Progressing   Problem: Nutrition: Goal: Adequate nutrition will be maintained Outcome: Progressing   Problem: Coping: Goal: Level of anxiety will decrease Outcome: Progressing

## 2023-12-12 NOTE — Plan of Care (Signed)
  Problem: Education: Goal: Knowledge of General Education information will improve Description: Including pain rating scale, medication(s)/side effects and non-pharmacologic comfort measures Outcome: Progressing   Problem: Health Behavior/Discharge Planning: Goal: Ability to manage health-related needs will improve Outcome: Progressing   Problem: Clinical Measurements: Goal: Ability to maintain clinical measurements within normal limits will improve Outcome: Progressing Goal: Will remain free from infection Outcome: Progressing Goal: Diagnostic test results will improve Outcome: Progressing Goal: Respiratory complications will improve Outcome: Progressing Goal: Cardiovascular complication will be avoided Outcome: Progressing   Problem: Activity: Goal: Risk for activity intolerance will decrease Outcome: Progressing   Problem: Nutrition: Goal: Adequate nutrition will be maintained Outcome: Progressing   Problem: Elimination: Goal: Will not experience complications related to bowel motility Outcome: Progressing Goal: Will not experience complications related to urinary retention Outcome: Progressing   Problem: Coping: Goal: Level of anxiety will decrease Outcome: Progressing   Problem: Pain Managment: Goal: General experience of comfort will improve and/or be controlled Outcome: Progressing   Problem: Safety: Goal: Ability to remain free from injury will improve Outcome: Progressing   Problem: Clinical Measurements: Goal: Ability to avoid or minimize complications of infection will improve Outcome: Progressing   Problem: Skin Integrity: Goal: Skin integrity will improve Outcome: Progressing   Problem: Education: Goal: Ability to describe self-care measures that may prevent or decrease complications (Diabetes Survival Skills Education) will improve Outcome: Progressing Goal: Individualized Educational Video(s) Outcome: Progressing   Problem: Fluid  Volume: Goal: Ability to maintain a balanced intake and output will improve Outcome: Progressing   Problem: Coping: Goal: Ability to adjust to condition or change in health will improve Outcome: Progressing   Problem: Health Behavior/Discharge Planning: Goal: Ability to identify and utilize available resources and services will improve Outcome: Progressing Goal: Ability to manage health-related needs will improve Outcome: Progressing

## 2023-12-12 NOTE — [place] FL2 (Signed)
   MEDICAID [id number] LEVEL OF CARE FORM     IDENTIFICATION  Patient Name: [name] Birthdate: February 23, 1956 Sex: female Admission Date (Current Location): 12/08/2023  [facility] and [place] Number:  Chiropodist and Address:  [facility], [address], [place], [place] [address]      Provider Number: [id number]  Attending Physician Name and Address:  Montey Apa, DO  Relative Name and Phone Number:       Current Level of Care: Hospital Recommended Level of Care: Nursing Facility Prior Approval Number:    Date Approved/Denied:   PASRR Number: [id number]  Discharge Plan: SNF    Current Diagnoses: Patient Active Problem List   Diagnosis Date Noted   Cellulitis of left lower extremity 12/08/2023   Leg pain 12/08/2023   Hyperkalemia 12/08/2023   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) 10/02/2023   Altered mental status 09/29/2023   Acute congestive heart failure (HCC) 09/26/2023   Acute encephalopathy 09/24/2023   Multiple open wounds of lower leg, initial encounter 09/12/2023   Thrombocytosis 09/10/2023   Anemia of chronic disease 09/10/2023   Osteomyelitis of left fibula (HCC) 09/09/2023   Type II diabetes mellitus with renal manifestations (HCC) 09/04/2023   Chronic systolic CHF (congestive heart failure) (HCC) 09/04/2023   Obesity, Class III, BMI 40-49.9 (morbid obesity) 09/04/2023   DKA (diabetic ketoacidosis) (HCC) 10/30/2021   PVD (peripheral vascular disease) (HCC)    Vertigo    Sepsis without acute organ dysfunction (HCC)    Acute kidney injury superimposed on CKD (HCC)    Adjustment disorder with anxiety 04/21/2021   Sepsis (HCC) 04/17/2021   Pressure injury of skin 04/17/2021   Left leg pain 04/16/2021   Morbid obesity (HCC) 10/24/2017   Chronic kidney disease, stage IV (severe) (HCC) 10/24/2017   Hypothyroidism 11/15/2016   Edema 11/15/2016   Type 2 diabetes mellitus with  hyperlipidemia (HCC) 11/15/2016   Essential hypertension 12/09/2015   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) 06/26/2015   Pressure ulcer 05/31/2015   Primary osteoarthritis of right hip 05/28/2015    Orientation RESPIRATION BLADDER Height & Weight     Self, Time, Situation, Place  Normal Incontinent Weight: 122.6 kg Height:  5' 5 (165.1 cm)  BEHAVIORAL SYMPTOMS/MOOD NEUROLOGICAL BOWEL NUTRITION STATUS      Incontinent Diet (Renal-carb modified with fluid restriction )  AMBULATORY STATUS COMMUNICATION OF NEEDS Skin   Total Care Verbally Normal                       Personal Care Assistance Level of Assistance  Bathing, Feeding, Dressing, Total care Bathing Assistance: Maximum assistance Feeding assistance: Maximum assistance Dressing Assistance: Maximum assistance Total Care Assistance: Maximum assistance   Functional Limitations Info             SPECIAL CARE FACTORS FREQUENCY                       Contractures Contractures Info: Not present    Additional Factors Info  Code Status Code Status Info: DNR-Limited             Current Medications (12/12/2023):  This is the current hospital active medication list Current Facility-Administered Medications  Medication Dose Route Frequency Provider Last Rate Last Admin   acetaminophen  (TYLENOL ) tablet 650 mg  650 mg Oral Q6H PRN Cox, Amy N, DO   650 mg at 12/10/23 [phone]  Or   acetaminophen  (TYLENOL ) suppository 650 mg  650 mg Rectal Q6H PRN Cox, Amy N, DO       apixaban  (ELIQUIS ) tablet 5 mg  5 mg Oral BID Cox, Amy N, DO   5 mg at 12/12/23 1051   ascorbic acid  (VITAMIN C ) tablet 500 mg  500 mg Oral Daily Cox, Amy N, DO   500 mg at 12/12/23 1052   bisacodyl  (DULCOLAX) EC tablet 5 mg  5 mg Oral Daily PRN Cox, Amy N, DO       calcium  carbonate (TUMS - dosed in mg elemental calcium ) chewable tablet 200-400 mg of elemental calcium   1-2 tablet Oral TID PRN Darus Engels A, DO   400 mg of elemental calcium  at 12/10/23 1211   clopidogrel  (PLAVIX ) tablet 75 mg  75 mg Oral Q breakfast Cox, Amy N, DO   75 mg at 12/12/23 0820   diclofenac Sodium (VOLTAREN) 1 % topical gel 2 g  2 g Topical TID Darus Engels A, DO       ferrous gluconate  [facility]) tablet 324 mg  324 mg Oral Q breakfast Cox, Amy N, DO   324 mg at 12/12/23 0820   fluticasone (FLONASE) 50 MCG/ACT nasal spray 1 spray  1 spray Each Nare Daily Darus Engels A, DO   1 spray at 12/12/23 1106   furosemide  (LASIX ) tablet 20 mg  20 mg Oral BID Darus Engels A, DO       gabapentin  (NEURONTIN ) capsule 300 mg  300 mg Oral QHS Cox, Amy N, DO   300 mg at 12/11/23 2124   gentamicin ointment (GARAMYCIN) 0.1 % 1 Application  1 Application Topical Daily Darus Engels A, DO       hydrALAZINE  (APRESOLINE ) injection 5 mg  5 mg Intravenous Q6H PRN Darus Engels A, DO   5 mg at 12/11/23 0547   HYDROmorphone  (DILAUDID ) injection 0.5 mg  0.5 mg Intravenous Daily PRN Darus Engels A, DO   0.5 mg at 12/12/23 1532   hydrOXYzine  (ATARAX ) tablet 10 mg  10 mg Oral Q8H PRN Cox, Amy N, DO   10 mg at 12/12/23 1055   insulin  aspart (novoLOG ) injection 0-5 Units  0-5 Units Subcutaneous QHS Cox, Amy N, DO       insulin  aspart (novoLOG ) injection 0-9 Units  0-9 Units Subcutaneous TID WC Cox, Amy N, DO   1 Units at 12/12/23 0818   insulin  glargine-yfgn (SEMGLEE ) injection 14 Units  14 Units Subcutaneous BID Cox, Amy N, DO   14 Units at 12/12/23 1059   labetalol  (NORMODYNE ) injection 10 mg  10 mg Intravenous Q4H PRN Darus Engels A, DO   10 mg at 12/11/23 1610   levothyroxine  (SYNTHROID ) tablet 200 mcg  200 mcg Oral QAC breakfast Cox, Amy N, DO   200 mcg at 12/12/23 9604   metoprolol  succinate (TOPROL -XL) 24 hr tablet 12.5 mg  12.5 mg Oral Q breakfast Darus Engels A, DO   12.5 mg at 12/12/23 1055   multivitamin with minerals tablet 1 tablet  1 tablet Oral Daily Cox, Amy N, DO   1 tablet at 12/12/23 1049   ondansetron   (ZOFRAN ) tablet 4 mg  4 mg Oral Q6H PRN Cox, Amy N, DO   4 mg at 12/10/23 2159   Or   ondansetron  (ZOFRAN ) injection 4 mg  4 mg Intravenous Q6H PRN Cox, Amy N, DO   4 mg at 12/10/23 2350   oxyCODONE  (Oxy IR/ROXICODONE ) immediate release tablet 5 mg  5 mg Oral Q6H PRN Darus Engels A, DO   5 mg at 12/12/23 1610   patiromer Bennye Bravo) packet 8.4 g  8.4 g Oral Daily Cox, Amy N, DO   8.4 g at 12/12/23 1419   promethazine  (PHENERGAN ) 12.5 mg in sodium chloride  0.9 % 50 mL IVPB  12.5 mg Intravenous Q6H PRN Duncan, Hazel V, MD       senna-docusate (Senokot-S) tablet 1 tablet  1 tablet Oral QHS PRN Cox, Amy N, DO       senna-docusate (Senokot-S) tablet 2 tablet  2 tablet Oral QHS Cox, Amy N, DO       vancomycin  (VANCOREADY) IVPB 1500 mg/300 mL  1,500 mg Intravenous Q48H Merrill, Kristin A, RPH   Stopped at 12/10/23 1924   zinc  oxide 20 % ointment 1 Application  1 Application Topical PRN Cox, Amy N, DO   1 Application at 12/09/23 1403     Discharge Medications: Please see discharge summary for a list of discharge medications.  Relevant Imaging Results:  Relevant Lab Results:   Additional Information SSN [id number]  Alexandra Ice, RN

## 2023-12-12 NOTE — Progress Notes (Signed)
 Progress Note   Patient: [name] ZOX:[id number] DOB: 01/25/1956 DOA: 12/08/2023     3 DOS: the patient was seen and examined on 12/12/2023   Brief hospital course: 68 year old female with history of hypertension, insulin -dependent diabetes mellitus, hypothyroid, CKD stage IV, who presents to the emergency department for chief concerns of worsening cellulitis of the left lower leg and thigh.   See H&P for full HPI on admission & ED course.  Patient was admitted 12/08/23 and started on IV antibiotics with left leg cellulitis as outlined in detail below.    Assessment and Plan:  * Cellulitis of left lower extremity Clinically undetermined if related to diabetes --Continue Rocephin  & Vancomycin  -- transition to PO tomorrow, would consider monitoring for improvement 24 hours on PO  --Monitor clinically for improvement --Follow blood cultures  Bilateral leg wounds - suspect venous stasis ulcers. 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  Baseline Cr ~1.4 Today Cr improved to 1.77 >> 1.69 --Resume Lasix  --Holding  --Nephrology following  Metabolic acidosis - due to renal failure --Mointor BMP  Nausea and Vomiting - unclear cause, intermittent, no abdominal pain --PRN anti-emetics   Type II diabetes mellitus with renal manifestations (HCC) --Home dose basal 14 units BID --Sliding scale Novolog   Essential hypertension --Home meds held on admission with soft BP's AKI --Resume metoprolol , Lasix  --On hold: amlodipine , clonidine , irbesartan  --IV hydralazine  and/or labetalol  PRN  Hypothyroidism --Continue levothyroxine    Obesity, Class III, BMI 40-49.9 (morbid obesity) Complicates overall  care and prognosis.  Recommend lifestyle modifications including physical activity and diet for weight loss and overall long-term health.      Subjective: Pt awake resting in bed on rounds this AM. She reports feeling okay, about the same.  Does not report nausea today.  Left thigh pain is unchanged.  No fever/chills.    Physical Exam: Vitals:   12/12/23 0513 12/12/23 0516 12/12/23 0720 12/12/23 0737  BP: (!) 156/70  (!) 138/90 (!) 137/90  Pulse: (!) 120 (!) 109 (!) 108 (!) 108  Resp: 18  18 18   Temp: 98.4 F (36.9 C)  98.2 F (36.8 C)   TempSrc:      SpO2: 99% 100% 100%   Weight:      Height:       General exam: awake, alert, no acute distress, morbidly obese HEENT: moist mucus membranes, hearing grossly normal  Respiratory system: on room air, normal respiratory effort. Cardiovascular system: RRR, BLE in wraps.   Gastrointestinal system: soft, NT, ND Central nervous system: A&O x 3. no gross focal neurologic deficits, normal speech Extremities: proximal left lateral thigh stable to improved mild erythema no differential warmth but area is tender on light superficial palpation, b/l distal LE's in wraps, multiple toe amputations Skin: dry, intact, normal temperature Psychiatry: normal mood, congruent affect, judgement and insight appear normal   Data Reviewed:  Notable labs --   Glucose 131 Cr 2.07 >> 1.77 >> 1.69 improving   Last Hbg 9.7    Family Communication: none. Pt updated in detail.  Disposition: Status is: Inpatient Remains inpatient appropriate because: remains on IV antibiotics pending further clinical improvement. Likely transition to PO antibiotics in next 24 hours, monitor for ongoing improvement.   Planned Discharge Destination: Skilled nursing facility  Time spent: 26 minutes  Author: Montey Apa, DO 12/12/2023 2:10 PM  For on call review [email or web address].

## 2023-12-13 DIAGNOSIS — L03116 Cellulitis of left lower limb: Secondary | ICD-10-CM | POA: Diagnosis not present

## 2023-12-13 LAB — BASIC METABOLIC PANEL WITH GFR
Anion gap: 4 — ABNORMAL LOW (ref 5–15)
BUN: 18 mg/dL (ref 8–23)
CO2: 25 mmol/L (ref 22–32)
Calcium: 9.2 mg/dL (ref 8.9–10.3)
Chloride: 110 mmol/L (ref 98–111)
Creatinine, Ser: 1.63 mg/dL — ABNORMAL HIGH (ref 0.44–1.00)
GFR, Estimated: 34 mL/min — ABNORMAL LOW (ref >60)
Glucose, Bld: 167 mg/dL — ABNORMAL HIGH (ref 70–99)
Potassium: 4.3 mmol/L (ref 3.5–5.1)
Sodium: 139 mmol/L (ref 135–145)

## 2023-12-13 LAB — GLUCOSE, CAPILLARY
Glucose-Capillary: 153 mg/dL — ABNORMAL HIGH (ref 70–99)
Glucose-Capillary: 105 mg/dL — ABNORMAL HIGH (ref 70–99)
Glucose-Capillary: 121 mg/dL — ABNORMAL HIGH (ref 70–99)
Glucose-Capillary: 93 mg/dL (ref 70–99)

## 2023-12-13 MED ORDER — DOXYCYCLINE HYCLATE 100 MG PO TABS
100.0000 mg | ORAL_TABLET | Freq: Two times a day (BID) | ORAL | Status: AC
  Administered 2023-12-13 – 2023-12-14 (×3): 100 mg via ORAL
  Filled 2023-12-13 (×4): qty 1

## 2023-12-13 NOTE — Progress Notes (Signed)
 Progress Note   Patient: [name] XBJ:[id number] DOB: [birth year] DOA: 12/08/2023     4 DOS: the patient was seen and examined on 12/13/2023    Brief hospital course: 68 year old female with history of hypertension, insulin -dependent diabetes mellitus, hypothyroid, CKD stage IV, who presents to the emergency department for chief concerns of worsening cellulitis of the left lower leg and thigh.   See H&P for full HPI on admission & ED course.   Patient was admitted 12/08/23 and started on IV antibiotics with left leg cellulitis as outlined in detail below.      Assessment and Plan:   * Cellulitis of left lower extremity Clinically undetermined if related to diabetes --Continue Rocephin  & Vancomycin  this has been transitioned to doxycycline  would consider monitoring for improvement 24 hours on PO  --Monitor clinically for improvement --Follow blood cultures   Bilateral leg wounds - suspect venous stasis ulcers. --WOC consulted - wound care per recommendations Case discussed with wound care   Bilateral leg pain - due to wounds --Resumed home oxycodone  --continue as needed IV dilaudid  for dressing changes   Hyperkalemia-improved --Nephrology consulted --Veltassa and Lokelma   Continue to monitor BMP   AKI superimposed on CKD stage IIIb Nephrology on board and case discussed   Metabolic acidosis - due to renal failure --Mointor BMP   Nausea and Vomiting - unclear cause, intermittent, no abdominal pain --PRN anti-emetics     Type II diabetes mellitus with renal manifestations (HCC) --Home dose basal 14 units BID --Sliding scale Novolog    Essential hypertension --Home meds held on admission with soft BP's AKI --Resume metoprolol , Lasix  --On hold: amlodipine , clonidine , irbesartan  --IV hydralazine  and/or labetalol  PRN   Hypothyroidism --Continue levothyroxine     Obesity, Class III, BMI 40-49.9 (morbid obesity) Complicates overall care and prognosis.  Recommend  lifestyle modifications including physical activity and diet for weight loss and overall long-term health.    Subjective:  Patient seen and examined at bedside this morning Denies  abdominal pain chest pain cough She did have some nausea vomiting this morning Noted to have some drainage from her leg Dressing has been changed this morning appears clean and dry   Physical Exam: General exam: awake, alert, no acute distress, morbidly obese HEENT: moist mucus membranes, hearing grossly normal  Respiratory system: on room air, normal respiratory effort. Cardiovascular system: RRR, BLE in wraps.   Gastrointestinal system: soft, NT, ND Central nervous system: A&O x 3. no gross focal neurologic deficits, normal speech Extremities: proximal left lateral thigh stable to improved mild erythema no differential warmth but area is tender on light superficial palpation, b/l distal LE's in wraps, multiple toe amputations Skin: dry, intact, normal temperature Psychiatry: normal mood, congruent affect, judgement and insight appear normal   Family Communication: none. Pt updated in detail.   Disposition: Status is: Inpatient Remains inpatient appropriate because: remains on IV antibiotics pending further clinical improvement. Likely transition to PO antibiotics in next 24 hours, monitor for ongoing improvement.    Planned Discharge Destination: Skilled nursing facility       Time spent: 36 minutes    Data Reviewed:    Latest Ref Rng & Units 12/09/2023    2:16 AM 12/08/2023   11:40 AM 10/02/2023    6:35 AM  CBC  WBC 4.0 - 10.5 K/uL 8.5  10.3  6.7   Hemoglobin 12.0 - 15.0 g/dL 9.7  9.7  9.7   Hematocrit 36.0 - 46.0 % 32.3  31.7  31.2   Platelets 150 -  400 K/uL 392  397  287        Latest Ref Rng & Units 12/13/2023    4:22 AM 12/12/2023    4:00 AM 12/11/2023    1:47 AM  BMP  Glucose 70 - 99 mg/dL 469  629  528   BUN 8 - 23 mg/dL 18  21  23    Creatinine 0.44 - 1.00 mg/dL 4.13  2.44  0.10    Sodium 135 - 145 mmol/L 139  140  140   Potassium 3.5 - 5.1 mmol/L 4.3  4.6  5.0   Chloride 98 - 111 mmol/L 110  110  110   CO2 22 - 32 mmol/L 25  23  19    Calcium  8.9 - 10.3 mg/dL 9.2  9.4  9.3       Vitals:   12/12/23 0737 12/12/23 1659 12/12/23 1900 12/12/23 2127  BP: (!) 137/90 (!) 144/66 133/69 137/89  Pulse: (!) 108 (!) 106 68 (!) 107  Resp: 18 18 18 18   Temp:  98.2 F (36.8 C) 98.3 F (36.8 C) 98 F (36.7 C)  TempSrc:  Oral Oral   SpO2:  99% 99% 97%  Weight:      Height:          Author: Ezzard Holms, MD 12/13/2023 6:35 PM  For on call review [email or web address].

## 2023-12-13 NOTE — Plan of Care (Signed)
   Problem: Activity: Goal: Risk for activity intolerance will decrease Outcome: Progressing   Problem: Nutrition: Goal: Adequate nutrition will be maintained Outcome: Progressing   Problem: Elimination: Goal: Will not experience complications related to bowel motility Outcome: Progressing

## 2023-12-13 NOTE — Progress Notes (Signed)
 [facility]  ROUNDING NOTE   Subjective:  Renal function improved. Creatinine down to 1.63.   Objective:  Vital signs in last 24 hours:  Temp:  [98 F (36.7 C)-98.3 F (36.8 C)] 98 F (36.7 C) (06/17 2127) Pulse Rate:  [68-107] 107 (06/17 2127) Resp:  [18] 18 (06/17 2127) BP: (133-144)/(66-89) 137/89 (06/17 2127) SpO2:  [97 %-99 %] 97 % (06/17 2127)  Weight change:  Filed Weights   12/08/23 1411  Weight: 122.6 kg    Intake/Output: I/O last 3 completed shifts: In: 0  Out: 1450 [Urine:1450]   Intake/Output this shift:  No intake/output data recorded.  Physical Exam: General: Large body habitus  Head: Normocephalic, atraumatic. Moist oral mucosal membranes  Eyes: Anicteric  Lungs:  Diminished to auscultation  Heart: Regular rate and rhythm  Abdomen:  Soft, nontender, BS present  Extremities:  +1 dependent peripheral edema.  Neurologic: Nonfocal, moving all four extremities  Skin: BLLE wrapped for wounds    Basic Metabolic Panel: Recent Labs  Lab 12/09/23 0216 12/10/23 0407 12/10/23 0807 12/11/23 0147 12/12/23 0400 12/13/23 0422  NA 136 138 136 140 140 139  K 5.9* 5.2* 5.2* 5.0 4.6 4.3  CL 106 109 109 110 110 110  CO2 22 20* 17* 19* 23 25  GLUCOSE 149* 127* 132* 153* 131* 167*  BUN 29* 28* 27* 23 21 18   CREATININE 2.55* 2.12* 2.07* 1.77* 1.69* 1.63*  CALCIUM  8.9 9.5 9.0 9.3 9.4 9.2  PHOS 5.3*  --  3.7 3.2  --   --     Liver Function Tests: Recent Labs  Lab 12/08/23 1140 12/10/23 0807 12/11/23 0147  AST 23  --   --   ALT 12  --   --   ALKPHOS 81  --   --   BILITOT 0.5  --   --   PROT 7.2  --   --   ALBUMIN 2.4* 2.4* 2.4*   No results for input(s): LIPASE, AMYLASE in the last 168 hours. No results for input(s): AMMONIA in the last 168 hours.  CBC: Recent Labs  Lab 12/08/23 1140 12/09/23 0216  WBC 10.3 8.5  HGB 9.7* 9.7*  HCT 31.7* 32.3*  MCV 89.5 90.7  PLT 397 392    Cardiac Enzymes: Recent Labs  Lab  12/08/23 1706  CKTOTAL 16*    BNP: Invalid input(s): POCBNP  CBG: Recent Labs  Lab 12/12/23 1657 12/12/23 2131 12/13/23 0743 12/13/23 1150 12/13/23 1623  GLUCAP 149* 159* 153* 105* 121*    Microbiology: Results for orders placed or performed during the hospital encounter of 12/08/23  MRSA Next Gen by PCR, Nasal     Status: None   Collection Time: 12/08/23  2:23 PM   Specimen: Nasal Mucosa; Nasal Swab  Result Value Ref Range Status   MRSA by PCR Next Gen NOT DETECTED NOT DETECTED Final    Comment: (NOTE) The GeneXpert MRSA Assay (FDA approved for NASAL specimens only), is one component of a comprehensive MRSA colonization surveillance program. It is not intended to diagnose MRSA infection nor to guide or monitor treatment for MRSA infections. Test performance is not FDA approved in patients less than 32 years old. Performed at [facility], [address] [place], [place] [address]     Coagulation Studies: No results for input(s): LABPROT, INR in the last 72 hours.  Urinalysis: No results for input(s): COLORURINE, LABSPEC, PHURINE, GLUCOSEU, HGBUR, BILIRUBINUR, KETONESUR, PROTEINUR, UROBILINOGEN, NITRITE, LEUKOCYTESUR in the last 72 hours.  Invalid  input(s): APPERANCEUR    Imaging: No results found.    Medications:    promethazine  (PHENERGAN ) injection (IM or IVPB)      apixaban   5 mg Oral BID   ascorbic acid   500 mg Oral Daily   clopidogrel   75 mg Oral Q breakfast   diclofenac Sodium  2 g Topical TID   doxycycline   100 mg Oral Q12H   ferrous gluconate   324 mg Oral Q breakfast   fluticasone  1 spray Each Nare Daily   furosemide   20 mg Oral BID   gabapentin   300 mg Oral QHS   gentamicin ointment  1 Application Topical Daily   insulin  aspart  0-5 Units Subcutaneous QHS   insulin  aspart  0-9 Units Subcutaneous TID WC   insulin  glargine-yfgn  14 Units Subcutaneous BID   levothyroxine   200 mcg Oral QAC breakfast    metoprolol  succinate  12.5 mg Oral Q breakfast   multivitamin with minerals  1 tablet Oral Daily   patiromer  8.4 g Oral Daily   senna-docusate  2 tablet Oral QHS   bisacodyl , calcium  carbonate, HYDROmorphone  (DILAUDID ) injection, hydrOXYzine , labetalol , oxyCODONE , promethazine  (PHENERGAN ) injection (IM or IVPB), senna-docusate, zinc  oxide  Assessment/ Plan:  [name] is a 68 y.o.  female  with past medical history of chronic bilateral lower extremity wounds, left fibula osteomyelitis, chronic systolic heart failure ejection fraction 45%, chronic lymphedema, hypertension, diabetes mellitus type 2, chronic kidney disease stage IIIb who presented with left lower extremity cellulitis and found to have significant hyperkalemia.   1.  Acute kidney injury/chronic kidney disease stage IIIb baseline creatinine 1.4 with EGFR 40 on 09/12/2023.   Serum creatinine peaked at 4.02 but has since been improving  Update: Creatinine down to 1.63.  Continue supportive care.  No indication for dialysis.  2.  Hyperkalemia, improved  Maintain the patient on Veltassa 8.4 g daily.  Serum potassium 4.3.   3.  Anemia of chronic kidney disease.  Continue to monitor hemoglobin as an outpatient.  May need to consider Epogen as outpatient.  4.  Dependent edema and lymphedema -Currently not on diuretics.  Continue to monitor.   LOS: 4 [name] 6/18/20254:40 PM

## 2023-12-13 NOTE — Progress Notes (Signed)
 Nurse went into to complete the dressing change. Nurse offered the PRN dilaudid  that is to be given before the dressing changed.  Patient refused.  Nurse educated on the purpose of the pain medication before the dressing change and patient again refused the pain medication and opted to get the dressing done without the medication.

## 2023-12-13 NOTE — TOC Progression Note (Signed)
 Transition of Care [facility]) - Progression Note    Patient Details  Name: [name] MRN: [id number] Date of Birth: 12-Jun-1956  [facility] CM/SW Contact  Alexandra Ice, RN Phone Number: 12/13/2023, 8:47 AM  Clinical Narrative:      Benjaman Branch is still pending MD signature Expected Discharge Plan and Services                                               Social Determinants of Health (SDOH) Interventions SDOH Screenings   Food Insecurity: No Food Insecurity (12/08/2023)  Housing: Low Risk  (12/08/2023)  Transportation Needs: No Transportation Needs (12/08/2023)  Utilities: Not At Risk (12/08/2023)  Alcohol Screen: Low Risk  (03/10/2021)  Depression (PHQ2-9): Low Risk  (03/10/2021)  Financial Resource Strain: Low Risk  (03/10/2021)  Physical Activity: Inactive (03/10/2021)  Social Connections: Socially Isolated (12/08/2023)  Stress: No Stress Concern Present (03/10/2021)  Tobacco Use: Low Risk  (12/08/2023)    Readmission Risk Interventions    11/03/2021   12:41 PM  Readmission Risk Prevention Plan  Transportation Screening Complete  PCP or Specialist Appt within 3-5 Days Complete  HRI or Home Care Consult Complete  Social Work Consult for Recovery Care Planning/Counseling Complete  Palliative Care Screening Complete  Medication Review Oceanographer) Complete

## 2023-12-14 DIAGNOSIS — L03116 Cellulitis of left lower limb: Secondary | ICD-10-CM | POA: Diagnosis not present

## 2023-12-14 LAB — CBC WITH DIFFERENTIAL/PLATELET
Abs Immature Granulocytes: 0.03 10*3/uL (ref 0.00–0.07)
Basophils Absolute: 0.1 10*3/uL (ref 0.0–0.1)
Basophils Relative: 2 %
Eosinophils Absolute: 0.5 10*3/uL (ref 0.0–0.5)
Eosinophils Relative: 8 %
HCT: 32.8 % — ABNORMAL LOW (ref 36.0–46.0)
Hemoglobin: 10.1 g/dL — ABNORMAL LOW (ref 12.0–15.0)
Immature Granulocytes: 0 %
Lymphocytes Relative: 23 %
Lymphs Abs: 1.6 10*3/uL (ref 0.7–4.0)
MCH: 27.6 pg (ref 26.0–34.0)
MCHC: 30.8 g/dL (ref 30.0–36.0)
MCV: 89.6 fL (ref 80.0–100.0)
Monocytes Absolute: 0.7 10*3/uL (ref 0.1–1.0)
Monocytes Relative: 11 %
Neutro Abs: 3.8 10*3/uL (ref 1.7–7.7)
Neutrophils Relative %: 56 %
Platelets: 442 10*3/uL — ABNORMAL HIGH (ref 150–400)
RBC: 3.66 MIL/uL — ABNORMAL LOW (ref 3.87–5.11)
RDW: 17.2 % — ABNORMAL HIGH (ref 11.5–15.5)
WBC: 6.7 10*3/uL (ref 4.0–10.5)
nRBC: 0 % (ref 0.0–0.2)

## 2023-12-14 LAB — BASIC METABOLIC PANEL WITH GFR
Anion gap: 7 (ref 5–15)
BUN: 16 mg/dL (ref 8–23)
CO2: 21 mmol/L — ABNORMAL LOW (ref 22–32)
Calcium: 9 mg/dL (ref 8.9–10.3)
Chloride: 110 mmol/L (ref 98–111)
Creatinine, Ser: 1.52 mg/dL — ABNORMAL HIGH (ref 0.44–1.00)
GFR, Estimated: 37 mL/min — ABNORMAL LOW (ref >60)
Glucose, Bld: 121 mg/dL — ABNORMAL HIGH (ref 70–99)
Potassium: 4.5 mmol/L (ref 3.5–5.1)
Sodium: 138 mmol/L (ref 135–145)

## 2023-12-14 LAB — GLUCOSE, CAPILLARY
Glucose-Capillary: 101 mg/dL — ABNORMAL HIGH (ref 70–99)
Glucose-Capillary: 137 mg/dL — ABNORMAL HIGH (ref 70–99)
Glucose-Capillary: 154 mg/dL — ABNORMAL HIGH (ref 70–99)
Glucose-Capillary: 197 mg/dL — ABNORMAL HIGH (ref 70–99)
Glucose-Capillary: 174 mg/dL — ABNORMAL HIGH (ref 70–99)

## 2023-12-14 NOTE — Progress Notes (Signed)
 [facility]  ROUNDING NOTE   Subjective:  Renal function continues to improve. Creatinine down to 1.5. Potassium also acceptable at 4.5. 06/18 0701 - 06/19 0700 In: -  Out: 800 [Urine:800] Lab Results  Component Value Date   CREATININE 1.52 (H) 12/14/2023   CREATININE 1.63 (H) 12/13/2023   CREATININE 1.69 (H) 12/12/2023     Objective:  Vital signs in last 24 hours:  Temp:  [98 F (36.7 C)-99.5 F (37.5 C)] 99.5 F (37.5 C) (06/19 1452) Pulse Rate:  [76-110] 110 (06/19 1452) Resp:  [18] 18 (06/19 0811) BP: (138-142)/(76-79) 138/76 (06/19 0507) SpO2:  [98 %-100 %] 99 % (06/19 1452)  Weight change:  Filed Weights   12/08/23 1411  Weight: 122.6 kg    Intake/Output: I/O last 3 completed shifts: In: -  Out: 1600 [Urine:1600]   Intake/Output this shift:  Total I/O In: 530 [P.O.:480; IV Piggyback:50] Out: 400 [Urine:400]  Physical Exam: General: Large body habitus  Head: Normocephalic, atraumatic. Moist oral mucosal membranes  Eyes: Anicteric  Lungs:  Diminished to auscultation  Heart: Regular rate and rhythm  Abdomen:  Soft, nontender, BS present  Extremities:  +1 dependent peripheral edema.  Neurologic: Nonfocal, moving all four extremities  Skin: BLLE wrapped for wounds    Basic Metabolic Panel: Recent Labs  Lab 12/09/23 0216 12/10/23 0407 12/10/23 0807 12/11/23 0147 12/12/23 0400 12/13/23 0422 12/14/23 0315  NA 136   < > 136 140 140 139 138  K 5.9*   < > 5.2* 5.0 4.6 4.3 4.5  CL 106   < > 109 110 110 110 110  CO2 22   < > 17* 19* 23 25 21*  GLUCOSE 149*   < > 132* 153* 131* 167* 121*  BUN 29*   < > 27* 23 21 18 16   CREATININE 2.55*   < > 2.07* 1.77* 1.69* 1.63* 1.52*  CALCIUM  8.9   < > 9.0 9.3 9.4 9.2 9.0  PHOS 5.3*  --  3.7 3.2  --   --   --    < > = values in this interval not displayed.    Liver Function Tests: Recent Labs  Lab 12/08/23 1140 12/10/23 0807 12/11/23 0147  AST 23  --   --   ALT 12  --   --   ALKPHOS 81   --   --   BILITOT 0.5  --   --   PROT 7.2  --   --   ALBUMIN 2.4* 2.4* 2.4*   No results for input(s): LIPASE, AMYLASE in the last 168 hours. No results for input(s): AMMONIA in the last 168 hours.  CBC: Recent Labs  Lab 12/08/23 1140 12/09/23 0216 12/14/23 0315  WBC 10.3 8.5 6.7  NEUTROABS  --   --  3.8  HGB 9.7* 9.7* 10.1*  HCT 31.7* 32.3* 32.8*  MCV 89.5 90.7 89.6  PLT 397 392 442*    Cardiac Enzymes: Recent Labs  Lab 12/08/23 1706  CKTOTAL 16*    BNP: Invalid input(s): POCBNP  CBG: Recent Labs  Lab 12/13/23 1623 12/13/23 2143 12/14/23 0808 12/14/23 1143 12/14/23 1621  GLUCAP 121* 93 101* 137* 154*    Microbiology: Results for orders placed or performed during the hospital encounter of 12/08/23  MRSA Next Gen by PCR, Nasal     Status: None   Collection Time: 12/08/23  2:23 PM   Specimen: Nasal Mucosa; Nasal Swab  Result Value Ref Range Status   MRSA by PCR Next  Gen NOT DETECTED NOT DETECTED Final    Comment: (NOTE) The GeneXpert MRSA Assay (FDA approved for NASAL specimens only), is one component of a comprehensive MRSA colonization surveillance program. It is not intended to diagnose MRSA infection nor to guide or monitor treatment for MRSA infections. Test performance is not FDA approved in patients less than 29 years old. Performed at [facility], [address] [place], [place] [address]     Coagulation Studies: No results for input(s): LABPROT, INR in the last 72 hours.  Urinalysis: No results for input(s): COLORURINE, LABSPEC, PHURINE, GLUCOSEU, HGBUR, BILIRUBINUR, KETONESUR, PROTEINUR, UROBILINOGEN, NITRITE, LEUKOCYTESUR in the last 72 hours.  Invalid input(s): APPERANCEUR    Imaging: No results found.    Medications:    promethazine  (PHENERGAN ) injection (IM or IVPB) 12.5 mg (12/14/23 1401)    apixaban   5 mg Oral BID   ascorbic acid   500 mg Oral Daily   clopidogrel   75 mg Oral Q  breakfast   diclofenac Sodium  2 g Topical TID   doxycycline   100 mg Oral Q12H   ferrous gluconate   324 mg Oral Q breakfast   fluticasone  1 spray Each Nare Daily   furosemide   20 mg Oral BID   gabapentin   300 mg Oral QHS   gentamicin ointment  1 Application Topical Daily   insulin  aspart  0-5 Units Subcutaneous QHS   insulin  aspart  0-9 Units Subcutaneous TID WC   insulin  glargine-yfgn  14 Units Subcutaneous BID   levothyroxine   200 mcg Oral QAC breakfast   metoprolol  succinate  12.5 mg Oral Q breakfast   multivitamin with minerals  1 tablet Oral Daily   patiromer  8.4 g Oral Daily   senna-docusate  2 tablet Oral QHS   bisacodyl , calcium  carbonate, HYDROmorphone  (DILAUDID ) injection, hydrOXYzine , labetalol , oxyCODONE , promethazine  (PHENERGAN ) injection (IM or IVPB), senna-docusate, zinc  oxide  Assessment/ Plan:  [name] is a 68 y.o.  female  with past medical history of chronic bilateral lower extremity wounds, left fibula osteomyelitis, chronic systolic heart failure ejection fraction 45%, chronic lymphedema, hypertension, diabetes mellitus type 2, chronic kidney disease stage IIIb who presented with left lower extremity cellulitis and found to have significant hyperkalemia.   1.  Acute kidney injury/chronic kidney disease stage IIIb baseline creatinine 1.4 with EGFR 40 on 09/12/2023.  Creatinine continues to trend down and currently 1.5.  Continue supportive care and avoid nephrotoxins as possible.  2.  Hyperkalemia, improved  Potassium 4.5 and acceptable.  Maintain the patient on Veltassa 8.4 g daily.   3.  Anemia of chronic kidney disease.  Continue to monitor hemoglobin as an outpatient.  May need to consider Epogen as outpatient.  Lab Results  Component Value Date   HGB 10.1 (L) 12/14/2023    4.  Dependent edema and lymphedema - Back on Lasix  20 mg twice daily.   LOS: 5 [name] 6/19/20255:19 PM

## 2023-12-14 NOTE — Progress Notes (Signed)
 Patient refusing most medications and blood pressure assessment at this time.  MD. Notified.

## 2023-12-14 NOTE — Progress Notes (Signed)
 Progress Note   Patient: [name] DGU:[id number] DOB: 1955-07-12 DOA: 12/08/2023     5 DOS: the patient was seen and examined on 12/14/2023      Brief hospital course: 68 year old female with history of hypertension, insulin -dependent diabetes mellitus, hypothyroid, CKD stage IV, who presents to the emergency department for chief concerns of worsening cellulitis of the left lower leg and thigh.   See H&P for full HPI on admission & ED course.   Patient was admitted 12/08/23 and started on IV antibiotics with left leg cellulitis as outlined in detail below.      Assessment and Plan:   * Cellulitis of left lower extremity Clinically undetermined if related to diabetes --Continue Rocephin  & Vancomycin  this has been transitioned to doxycycline  would consider monitoring for improvement 24 hours on PO  --Monitor clinically for improvement --Follow blood cultures   Bilateral leg wounds - suspect venous stasis ulcers. --WOC consulted - wound care per recommendations Case discussed with wound care   Bilateral leg pain - due to wounds --Resumed home oxycodone  --continue as needed IV dilaudid  for dressing changes   Hyperkalemia-improved --Nephrology consulted --Veltassa and Lokelma   Continue to monitor BMP   AKI superimposed on CKD stage IIIb Nephrology on board and case discussed   Metabolic acidosis - due to renal failure --Mointor BMP   Nausea and Vomiting - unclear cause, intermittent, no abdominal pain --PRN anti-emetics     Type II diabetes mellitus with renal manifestations (HCC) --Home dose basal 14 units BID --Sliding scale Novolog    Essential hypertension --Home meds held on admission with soft BP's AKI Continue metoprolol , Lasix  --On hold: amlodipine , clonidine , irbesartan  --IV hydralazine  and/or labetalol  PRN   Hypothyroidism --Continue levothyroxine     Obesity, Class III, BMI 40-49.9 (morbid obesity) Complicates overall care and prognosis.   Recommend lifestyle modifications including physical activity and diet for weight loss and overall long-term health.     Subjective:  Patient seen and examined at bedside this morning Denies  abdominal pain chest pain cough   Physical Exam: General exam: awake, alert, no acute distress, morbidly obese HEENT: moist mucus membranes, hearing grossly normal  Respiratory system: on room air, normal respiratory effort. Cardiovascular system: RRR, BLE in wraps.   Gastrointestinal system: soft, NT, ND Central nervous system: A&O x 3. no gross focal neurologic deficits, normal speech Extremities: proximal left lateral thigh stable to improved mild erythema no differential warmth but area is tender on light superficial palpation, b/l distal LE's in wraps, multiple toe amputations Skin: dry, intact, normal temperature Psychiatry: normal mood, congruent affect, judgement and insight appear normal    Family Communication: none. Pt updated in detail.   Disposition: Status is: Inpatient Remains inpatient appropriate because: remains on IV antibiotics pending further clinical improvement. Likely transition to PO antibiotics in next 24 hours, monitor for ongoing improvement.    Planned Discharge Destination: Skilled nursing facility       Time spent: 36 minutes     Data Reviewed:   Vitals:   12/13/23 1954 12/14/23 0507 12/14/23 0811 12/14/23 1452  BP:  138/76    Pulse: 88 76 (!) 107 (!) 110  Resp: 18 18 18    Temp: 98 F (36.7 C) 98 F (36.7 C) 98.8 F (37.1 C) 99.5 F (37.5 C)  TempSrc: Oral Oral Oral Oral  SpO2: 98% 98% 100% 99%  Weight:      Height:          Latest Ref Rng & Units 12/14/2023  3:15 AM 12/09/2023    2:16 AM 12/08/2023   11:40 AM  CBC  WBC 4.0 - 10.5 K/uL 6.7  8.5  10.3   Hemoglobin 12.0 - 15.0 g/dL 69.6  9.7  9.7   Hematocrit 36.0 - 46.0 % 32.8  32.3  31.7   Platelets 150 - 400 K/uL 442  392  397        Latest Ref Rng & Units 12/14/2023    3:15 AM 12/13/2023     4:22 AM 12/12/2023    4:00 AM  BMP  Glucose 70 - 99 mg/dL 295  284  132   BUN 8 - 23 mg/dL 16  18  21    Creatinine 0.44 - 1.00 mg/dL 4.40  1.02  7.25   Sodium 135 - 145 mmol/L 138  139  140   Potassium 3.5 - 5.1 mmol/L 4.5  4.3  4.6   Chloride 98 - 111 mmol/L 110  110  110   CO2 22 - 32 mmol/L 21  25  23    Calcium  8.9 - 10.3 mg/dL 9.0  9.2  9.4      Author: Ezzard Holms, MD 12/14/2023 4:20 PM  For on call review [email or web address].

## 2023-12-14 NOTE — Plan of Care (Signed)
   Problem: Education: Goal: Knowledge of General Education information will improve Description Including pain rating scale, medication(s)/side effects and non-pharmacologic comfort measures Outcome: Progressing

## 2023-12-15 DIAGNOSIS — L03116 Cellulitis of left lower limb: Secondary | ICD-10-CM | POA: Diagnosis not present

## 2023-12-15 LAB — BASIC METABOLIC PANEL WITH GFR
Anion gap: 2 — ABNORMAL LOW (ref 5–15)
BUN: 20 mg/dL (ref 8–23)
CO2: 26 mmol/L (ref 22–32)
Calcium: 8.9 mg/dL (ref 8.9–10.3)
Chloride: 110 mmol/L (ref 98–111)
Creatinine, Ser: 1.38 mg/dL — ABNORMAL HIGH (ref 0.44–1.00)
GFR, Estimated: 41 mL/min — ABNORMAL LOW (ref >60)
Glucose, Bld: 154 mg/dL — ABNORMAL HIGH (ref 70–99)
Potassium: 3.9 mmol/L (ref 3.5–5.1)
Sodium: 138 mmol/L (ref 135–145)

## 2023-12-15 LAB — CBC WITH DIFFERENTIAL/PLATELET
Abs Immature Granulocytes: 0.03 10*3/uL (ref 0.00–0.07)
Basophils Absolute: 0.1 10*3/uL (ref 0.0–0.1)
Basophils Relative: 1 %
Eosinophils Absolute: 0.7 10*3/uL — ABNORMAL HIGH (ref 0.0–0.5)
Eosinophils Relative: 8 %
HCT: 32.4 % — ABNORMAL LOW (ref 36.0–46.0)
Hemoglobin: 10 g/dL — ABNORMAL LOW (ref 12.0–15.0)
Immature Granulocytes: 0 %
Lymphocytes Relative: 24 %
Lymphs Abs: 1.8 10*3/uL (ref 0.7–4.0)
MCH: 27.4 pg (ref 26.0–34.0)
MCHC: 30.9 g/dL (ref 30.0–36.0)
MCV: 88.8 fL (ref 80.0–100.0)
Monocytes Absolute: 0.7 10*3/uL (ref 0.1–1.0)
Monocytes Relative: 9 %
Neutro Abs: 4.4 10*3/uL (ref 1.7–7.7)
Neutrophils Relative %: 58 %
Platelets: 446 10*3/uL — ABNORMAL HIGH (ref 150–400)
RBC: 3.65 MIL/uL — ABNORMAL LOW (ref 3.87–5.11)
RDW: 17.3 % — ABNORMAL HIGH (ref 11.5–15.5)
WBC: 7.8 10*3/uL (ref 4.0–10.5)
nRBC: 0 % (ref 0.0–0.2)

## 2023-12-15 LAB — GLUCOSE, CAPILLARY
Glucose-Capillary: 113 mg/dL — ABNORMAL HIGH (ref 70–99)
Glucose-Capillary: 169 mg/dL — ABNORMAL HIGH (ref 70–99)

## 2023-12-15 MED ORDER — BISACODYL 5 MG PO TBEC: 5.0000 mg | DELAYED_RELEASE_TABLET | Freq: Every day | ORAL | Status: AC | PRN

## 2023-12-15 NOTE — Discharge Summary (Signed)
 Physician Discharge Summary   Patient: [name] MRN: [id number] DOB: 04-10-1956  Admit date:     12/08/2023  Discharge date: 12/15/23  Discharge Physician: Ezzard Holms   PCP: Pcp, No   Recommendations at discharge:  Follow-up with PCP as well as nephrology  Discharge Diagnoses:  Cellulitis of left lower extremity Bilateral leg wounds  Bilateral leg pain - due to wounds Hyperkalemia-improved AKI superimposed on CKD stage IIIb Metabolic acidosis  Nausea and Vomiting  Type II diabetes mellitus with renal manifestations (HCC) Essential hypertension Hypothyroidism Obesity, Class III, BMI 40-49.9 (morbid obesity)      Hospital Course: 68 year old female with history of hypertension, insulin -dependent diabetes mellitus, hypothyroid, CKD stage IV, who presents to the emergency department for chief concerns of worsening cellulitis of the left lower leg and thigh.  Patient required IV antibiotic therapy for 7 days.  Cellulitis improved and patient was also seen by wound care managemenT.  Patient found to have stasis dermatitis and ulceration requiring wound care management as outlined below. Dressing procedure/placement/frequency: Clean with Vashe [id number]. Apply Xeroform on the wound bed (legs and heel). Wrap with Kerlix and ACE wrap as a multi layer compressive therapy. Wrap since the base of the toes, until the knees. Change daily.   Consultants: Nephrology Procedures performed: None Disposition: Home Diet recommendation:  Cardiac diet DISCHARGE MEDICATION: Allergies as of 12/15/2023       Reactions   Cefepime  Other (See Comments)   Encephalopathy -- precipitated by AKI and opioids        Medication List     STOP taking these medications    amLODipine  5 MG tablet Commonly known as: NORVASC    cloNIDine  0.3 MG tablet Commonly known as: CATAPRES    HYDROmorphone  2 MG tablet Commonly known as: DILAUDID    irbesartan  150 MG tablet Commonly known as: AVAPRO         TAKE these medications    acetaminophen  325 MG tablet Commonly known as: TYLENOL  Take 2 tablets (650 mg total) by mouth every 6 (six) hours as needed for mild pain (or Fever >/= 101).   apixaban  5 MG Tabs tablet Commonly known as: ELIQUIS  Take 1 tablet (5 mg total) by mouth 2 (two) times daily.   ascorbic acid  500 MG tablet Commonly known as: VITAMIN C  Take 1 tablet (500 mg total) by mouth daily.   bisacodyl  5 MG EC tablet Commonly known as: DULCOLAX Take 1 tablet (5 mg total) by mouth daily as needed for moderate constipation.   blood glucose meter kit and supplies 1 each by Other route daily. ONE TOUCH ULTRA METER. Use as directed. E11.29   clopidogrel  75 MG tablet Commonly known as: PLAVIX  Take 1 tablet (75 mg total) by mouth daily with breakfast.   diclofenac Sodium 1 % Gel Commonly known as: VOLTAREN Apply 2 g topically 3 (three) times daily.   doxycycline  100 MG capsule Commonly known as: VIBRAMYCIN  Take 100 mg by mouth 2 (two) times daily.   ferrous gluconate  324 MG tablet Commonly known as: FERGON Take 324 mg by mouth daily with breakfast.   fluticasone 50 MCG/ACT nasal spray Commonly known as: FLONASE Place 1 spray into both nostrils daily.   furosemide  20 MG tablet Commonly known as: LASIX  Take 1 tablet (20 mg total) by mouth 2 (two) times daily.   gabapentin  300 MG capsule Commonly known as: NEURONTIN  Take 1 capsule (300 mg total) by mouth at bedtime.   gentamicin ointment 0.1 % Commonly known as: GARAMYCIN Apply 1 Application  topically daily.   glucose blood test strip 1 each by Other route daily. ONE TOUCH ULTRA TEST STRIPS E11.29   hydrOXYzine  10 MG tablet Commonly known as: ATARAX  Take 10 mg by mouth every 8 (eight) hours as needed for itching.   insulin  aspart 100 UNIT/ML injection Commonly known as: novoLOG  Inject 3 Units into the skin 3 (three) times daily with meals. What changed: how much to take   Lantus  SoloStar 100  UNIT/ML Solostar Pen Generic drug: insulin  glargine Inject 14 Units into the skin every 12 (twelve) hours.   levothyroxine  200 MCG tablet Commonly known as: SYNTHROID  Take 200 mcg by mouth daily before breakfast.   metoprolol  succinate 25 MG 24 hr tablet Commonly known as: TOPROL -XL Take 0.5 tablets (12.5 mg total) by mouth daily with breakfast.   multivitamin with minerals Tabs tablet Take 1 tablet by mouth daily.   nystatin  powder Generic drug: nystatin  Apply 1 application. topically 3 (three) times daily.   ondansetron  4 MG disintegrating tablet Commonly known as: ZOFRAN -ODT Take 2 mg by mouth every 6 (six) hours as needed.   onetouch ultrasoft lancets 1 each by Other route daily. Use as instructed E11.29   oxyCODONE  5 MG immediate release tablet Commonly known as: Oxy IR/ROXICODONE  Take 5 mg by mouth every 6 (six) hours as needed for moderate pain (pain score 4-6).   senna-docusate 8.6-50 MG tablet Commonly known as: Senokot-S Take 2 tablets by mouth at bedtime.   Vitamin D (Ergocalciferol) 1.25 MG (50000 UNIT) Caps capsule Commonly known as: DRISDOL Take 50,000 Units by mouth every 7 (seven) days. Tuesday   zinc  oxide 20 % ointment Apply 1 Application topically as needed for diaper changes.        Discharge Exam: Filed Weights   12/08/23 1411  Weight: 122.6 kg   General exam: awake, alert, no acute distress, morbidly obese HEENT: moist mucus membranes, hearing grossly normal  Respiratory system: on room air, normal respiratory effort. Cardiovascular system: RRR, BLE in wraps.   Gastrointestinal system: soft, NT, ND Central nervous system: A&O x 3. no gross focal neurologic deficits, normal speech Extremities: Bilateral lower extremity stasis dermatitis requiring dressing Skin: dry, intact, normal temperature Psychiatry: normal mood, congruent affect, judgement and insight appear normal  Condition at discharge: good  The results of significant  diagnostics from this hospitalization (including imaging, microbiology, ancillary and laboratory) are listed below for reference.   Imaging Studies: US  RENAL Result Date: 12/08/2023 CLINICAL DATA:  [id number] AKI (acute kidney injury) (HCC) [id number]. EXAM: RENAL / URINARY TRACT ULTRASOUND COMPLETE COMPARISON:  None Available. FINDINGS: Examination is extremely limited due to patient's body habitus, limited patient mobility and cooperation. Examination was interrupted before the left kidney was evaluated. Right Kidney: Renal measurements: 4.1 x 4.3 x 7.9 cm = volume: 72.0 mL. Markedly limited evaluation. There is probable 3 cm cyst in the right kidney. Left Kidney: Not evaluated. Bladder: Not visualized and therefore not evaluated. Other: None. IMPRESSION: *Extremely limited exam.  Please see above for details. Electronically Signed   By: Beula Brunswick M.D.   On: 12/08/2023 18:12    Microbiology: Results for orders placed or performed during the hospital encounter of 12/08/23  MRSA Next Gen by PCR, Nasal     Status: None   Collection Time: 12/08/23  2:23 PM   Specimen: Nasal Mucosa; Nasal Swab  Result Value Ref Range Status   MRSA by PCR Next Gen NOT DETECTED NOT DETECTED Final    Comment: (NOTE) The GeneXpert MRSA Assay (  FDA approved for NASAL specimens only), is one component of a comprehensive MRSA colonization surveillance program. It is not intended to diagnose MRSA infection nor to guide or monitor treatment for MRSA infections. Test performance is not FDA approved in patients less than 27 years old. Performed at [facility], [address] [place], [place] [address]     Labs: CBC: Recent Labs  Lab 12/09/23 0216 12/14/23 0315 12/15/23 0252  WBC 8.5 6.7 7.8  NEUTROABS  --  3.8 4.4  HGB 9.7* 10.1* 10.0*  HCT 32.3* 32.8* 32.4*  MCV 90.7 89.6 88.8  PLT 392 442* 446*   Basic Metabolic Panel: Recent Labs  Lab 12/09/23 0216 12/10/23 0407 12/10/23 0807 12/11/23 0147  12/12/23 0400 12/13/23 0422 12/14/23 0315 12/15/23 0252  NA 136   < > 136 140 140 139 138 138  K 5.9*   < > 5.2* 5.0 4.6 4.3 4.5 3.9  CL 106   < > 109 110 110 110 110 110  CO2 22   < > 17* 19* 23 25 21* 26  GLUCOSE 149*   < > 132* 153* 131* 167* 121* 154*  BUN 29*   < > 27* 23 21 18 16 20   CREATININE 2.55*   < > 2.07* 1.77* 1.69* 1.63* 1.52* 1.38*  CALCIUM  8.9   < > 9.0 9.3 9.4 9.2 9.0 8.9  PHOS 5.3*  --  3.7 3.2  --   --   --   --    < > = values in this interval not displayed.   Liver Function Tests: Recent Labs  Lab 12/10/23 0807 12/11/23 0147  ALBUMIN 2.4* 2.4*   CBG: Recent Labs  Lab 12/14/23 1621 12/14/23 2209 12/14/23 2302 12/15/23 0759 12/15/23 1202  GLUCAP 154* 197* 174* 113* 169*    Discharge time spent:  39 minutes.  Signed: Ezzard Holms, MD [facility] 12/15/2023

## 2023-12-15 NOTE — Progress Notes (Signed)
 [facility]  ROUNDING NOTE   Subjective:  Renal function continues to improve. Creatinine down to 1.38. 06/19 0701 - 06/20 0700 In: 770 [P.O.:720; IV Piggyback:50] Out: 1000 [Urine:1000] Lab Results  Component Value Date   CREATININE 1.38 (H) 12/15/2023   CREATININE 1.52 (H) 12/14/2023   CREATININE 1.63 (H) 12/13/2023     Objective:  Vital signs in last 24 hours:  Temp:  [97.1 F (36.2 C)-98.6 F (37 C)] 98.4 F (36.9 C) (06/20 1042) Pulse Rate:  [100-103] 103 (06/20 1042) Resp:  [18-19] 19 (06/20 1042) BP: (121)/(99) 121/99 (06/20 1042) SpO2:  [99 %-100 %] 100 % (06/20 1042)  Weight change:  Filed Weights   12/08/23 1411  Weight: 122.6 kg    Intake/Output: I/O last 3 completed shifts: In: 770 [P.O.:720; IV Piggyback:50] Out: 1800 [Urine:1800]   Intake/Output this shift:  No intake/output data recorded.  Physical Exam: General: Large body habitus  Head: Normocephalic, atraumatic. Moist oral mucosal membranes  Eyes: Anicteric  Lungs:  Diminished to auscultation  Heart: Regular rate and rhythm  Abdomen:  Soft, nontender, BS present  Extremities:  +1 dependent peripheral edema.  Neurologic: Nonfocal, moving all four extremities  Skin: BLLE wrapped for wounds    Basic Metabolic Panel: Recent Labs  Lab 12/09/23 0216 12/10/23 0407 12/10/23 0807 12/11/23 0147 12/12/23 0400 12/13/23 0422 12/14/23 0315 12/15/23 0252  NA 136   < > 136 140 140 139 138 138  K 5.9*   < > 5.2* 5.0 4.6 4.3 4.5 3.9  CL 106   < > 109 110 110 110 110 110  CO2 22   < > 17* 19* 23 25 21* 26  GLUCOSE 149*   < > 132* 153* 131* 167* 121* 154*  BUN 29*   < > 27* 23 21 18 16 20   CREATININE 2.55*   < > 2.07* 1.77* 1.69* 1.63* 1.52* 1.38*  CALCIUM  8.9   < > 9.0 9.3 9.4 9.2 9.0 8.9  PHOS 5.3*  --  3.7 3.2  --   --   --   --    < > = values in this interval not displayed.    Liver Function Tests: Recent Labs  Lab 12/10/23 0807 12/11/23 0147  ALBUMIN 2.4* 2.4*   No  results for input(s): LIPASE, AMYLASE in the last 168 hours. No results for input(s): AMMONIA in the last 168 hours.  CBC: Recent Labs  Lab 12/09/23 0216 12/14/23 0315 12/15/23 0252  WBC 8.5 6.7 7.8  NEUTROABS  --  3.8 4.4  HGB 9.7* 10.1* 10.0*  HCT 32.3* 32.8* 32.4*  MCV 90.7 89.6 88.8  PLT 392 442* 446*    Cardiac Enzymes: Recent Labs  Lab 12/08/23 1706  CKTOTAL 16*    BNP: Invalid input(s): POCBNP  CBG: Recent Labs  Lab 12/14/23 1621 12/14/23 2209 12/14/23 2302 12/15/23 0759 12/15/23 1202  GLUCAP 154* 197* 174* 113* 169*    Microbiology: Results for orders placed or performed during the hospital encounter of 12/08/23  MRSA Next Gen by PCR, Nasal     Status: None   Collection Time: 12/08/23  2:23 PM   Specimen: Nasal Mucosa; Nasal Swab  Result Value Ref Range Status   MRSA by PCR Next Gen NOT DETECTED NOT DETECTED Final    Comment: (NOTE) The GeneXpert MRSA Assay (FDA approved for NASAL specimens only), is one component of a comprehensive MRSA colonization surveillance program. It is not intended to diagnose MRSA infection nor to guide or monitor  treatment for MRSA infections. Test performance is not FDA approved in patients less than 15 years old. Performed at [facility], [address] [place], [place] [address]     Coagulation Studies: No results for input(s): LABPROT, INR in the last 72 hours.  Urinalysis: No results for input(s): COLORURINE, LABSPEC, PHURINE, GLUCOSEU, HGBUR, BILIRUBINUR, KETONESUR, PROTEINUR, UROBILINOGEN, NITRITE, LEUKOCYTESUR in the last 72 hours.  Invalid input(s): APPERANCEUR    Imaging: No results found.    Medications:    promethazine  (PHENERGAN ) injection (IM or IVPB) 12.5 mg (12/15/23 1107)    apixaban   5 mg Oral BID   ascorbic acid   500 mg Oral Daily   clopidogrel   75 mg Oral Q breakfast   diclofenac Sodium  2 g Topical TID   ferrous gluconate   324 mg Oral Q  breakfast   fluticasone  1 spray Each Nare Daily   furosemide   20 mg Oral BID   gabapentin   300 mg Oral QHS   gentamicin ointment  1 Application Topical Daily   insulin  aspart  0-5 Units Subcutaneous QHS   insulin  aspart  0-9 Units Subcutaneous TID WC   insulin  glargine-yfgn  14 Units Subcutaneous BID   levothyroxine   200 mcg Oral QAC breakfast   metoprolol  succinate  12.5 mg Oral Q breakfast   multivitamin with minerals  1 tablet Oral Daily   patiromer  8.4 g Oral Daily   senna-docusate  2 tablet Oral QHS   bisacodyl , calcium  carbonate, HYDROmorphone  (DILAUDID ) injection, hydrOXYzine , labetalol , oxyCODONE , promethazine  (PHENERGAN ) injection (IM or IVPB), senna-docusate, zinc  oxide  Assessment/ Plan:  [name] is a 68 y.o.  female  with past medical history of chronic bilateral lower extremity wounds, left fibula osteomyelitis, chronic systolic heart failure ejection fraction 45%, chronic lymphedema, hypertension, diabetes mellitus type 2, chronic kidney disease stage IIIb who presented with left lower extremity cellulitis and found to have significant hyperkalemia.   1.  Acute kidney injury/chronic kidney disease stage IIIb baseline creatinine 1.4 with EGFR 40 on 09/12/2023.  Creatinine now down to baseline at 1.38.  Recommend continued monitoring of renal function as an outpatient.  2.  Hyperkalemia, improved  Potassium improved down to 3.9.  Continue to monitor serum potassium as outpatient.   3.  Anemia of chronic kidney disease.  Hemoglobin currently at target at 10.0.  Repeat as an outpatient. Lab Results  Component Value Date   HGB 10.0 (L) 12/15/2023    4.  Dependent edema and lymphedema - Back on Lasix  20 mg twice daily.   LOS: 6 [name] 6/20/20254:02 PM

## 2023-12-15 NOTE — Progress Notes (Signed)
 Patient discharging to [facility], all belongings sent with patient. IV removed. Discharge education and teaching provided.

## 2023-12-15 NOTE — TOC Transition Note (Signed)
 Transition of Care [facility]) - Discharge Note   Patient Details  Name: [name] MRN: [id number] Date of Birth: 1955-11-24  Transition of Care [facility]) CM/SW Contact:  Alexandra Ice, RN Phone Number: 12/15/2023, 12:39 PM   Clinical Narrative:      Patient to discharge today, return to [facility]. Discharge summary and orders sent to facility via HUB. TOC spoke with Myrtie Atkinson at [facility] on schedule for pick up . Patient going to room 147A, nurse to call report to [phone]. EMS packet printed to nurse station. Notified MD and bedside nurse.  Final next level of care: Long Term Nursing Home Barriers to Discharge: Barriers Resolved   Patient Goals and CMS Choice Patient states their goals for this hospitalization and ongoing recovery are:: go home CMS Medicare.gov Compare Post Acute Care list provided to:: Patient Choice offered to / list presented to : Patient      Discharge Placement              Patient chooses bed at: [facility] Patient to be transferred to facility by: [facility]   Patient and family notified of of transfer: 12/15/23  Discharge Plan and Services Additional resources added to the After Visit Summary for                    DME Agency: NA       HH Arranged: NA          Social Drivers of Health (SDOH) Interventions SDOH Screenings   Food Insecurity: No Food Insecurity (12/08/2023)  Housing: Low Risk  (12/08/2023)  Transportation Needs: No Transportation Needs (12/08/2023)  Utilities: Not At Risk (12/08/2023)  Alcohol Screen: Low Risk  (03/10/2021)  Depression (PHQ2-9): Low Risk  (03/10/2021)  Financial Resource Strain: Low Risk  (03/10/2021)  Physical Activity: Inactive (03/10/2021)  Social Connections: Socially Isolated (12/08/2023)  Stress: No Stress Concern Present (03/10/2021)  Tobacco Use: Low Risk  (12/08/2023)     Readmission Risk Interventions    11/03/2021   12:41 PM  Readmission Risk Prevention Plan   Transportation Screening Complete  PCP or Specialist Appt within 3-5 Days Complete  HRI or Home Care Consult Complete  Social Work Consult for Recovery Care Planning/Counseling Complete  Palliative Care Screening Complete  Medication Review Ocean[employer]) Complete

## 2023-12-15 NOTE — Plan of Care (Addendum)
  Problem: Education: Goal: Knowledge of General Education information will improve Description: Including pain rating scale, medication(s)/side effects and non-pharmacologic comfort measures Outcome:  not Progressing   Problem: Health Behavior/Discharge Planning: Goal: Ability to manage health-related needs will improve Outcome: Progressing   Problem: Clinical Measurements: Goal: Ability to maintain clinical measurements within normal limits will improve Outcome: Progressing Goal: Will remain free from infection Outcome: Progressing Goal: Diagnostic test results will improve Outcome: Progressing Goal: Respiratory complications will improve Outcome: Progressing Goal: Cardiovascular complication will be avoided Outcome: Progressing   Problem: Activity: Goal: Risk for activity intolerance will decrease Outcome:  not Progressing   Problem: Pain Managment: Goal: General experience of comfort will improve and/or be controlled Outcome: Progressing   Problem: Safety: Goal: Ability to remain free from injury will improve Outcome:  not Progressing   Problem: Skin Integrity: Goal: Risk for impaired skin integrity will decrease Outcome: Progressing   Problem: Clinical Measurements: Goal: Ability to avoid or minimize complications of infection will improve Outcome: Progressing   Problem: Skin Integrity: Goal: Skin integrity will improve Outcome: Progressing   Problem: Education: Goal: Individualized Educational Video(s) Outcome: not Progressing   Problem: Coping: Goal: Ability to adjust to condition or change in health will improve Outcome: Progressing   Problem: Fluid Volume: Goal: Ability to maintain a balanced intake and output will improve Outcome: Progressing

## 2023-12-15 NOTE — Progress Notes (Signed)
 Pt constantly refuses caresometimes; educated on importance to get blood drawn; refused vitals and CBG last night from NT, RN was about to get vitals and CBG. Wound care dressing was done, fairly tolerated, Oxy 5mg  given.

## 2023-12-16 DIAGNOSIS — L0390 Cellulitis, unspecified: Secondary | ICD-10-CM | POA: Diagnosis not present

## 2023-12-16 DIAGNOSIS — Z743 Need for continuous supervision: Secondary | ICD-10-CM | POA: Diagnosis not present

## 2023-12-16 DIAGNOSIS — Z7401 Bed confinement status: Secondary | ICD-10-CM | POA: Diagnosis not present

## 2023-12-20 DIAGNOSIS — N179 Acute kidney failure, unspecified: Secondary | ICD-10-CM | POA: Diagnosis not present

## 2023-12-20 DIAGNOSIS — L03116 Cellulitis of left lower limb: Secondary | ICD-10-CM | POA: Diagnosis not present

## 2023-12-20 DIAGNOSIS — E039 Hypothyroidism, unspecified: Secondary | ICD-10-CM | POA: Diagnosis not present

## 2023-12-20 DIAGNOSIS — T07XXXA Unspecified multiple injuries, initial encounter: Secondary | ICD-10-CM | POA: Diagnosis not present

## 2023-12-20 DIAGNOSIS — N189 Chronic kidney disease, unspecified: Secondary | ICD-10-CM | POA: Diagnosis not present

## 2023-12-20 DIAGNOSIS — M79606 Pain in leg, unspecified: Secondary | ICD-10-CM | POA: Diagnosis not present

## 2023-12-20 DIAGNOSIS — I10 Essential (primary) hypertension: Secondary | ICD-10-CM | POA: Diagnosis not present

## 2023-12-20 DIAGNOSIS — E559 Vitamin D deficiency, unspecified: Secondary | ICD-10-CM | POA: Diagnosis not present

## 2023-12-26 DIAGNOSIS — Z794 Long term (current) use of insulin: Secondary | ICD-10-CM | POA: Diagnosis not present

## 2023-12-26 DIAGNOSIS — L84 Corns and callosities: Secondary | ICD-10-CM | POA: Diagnosis not present

## 2023-12-26 DIAGNOSIS — L602 Onychogryphosis: Secondary | ICD-10-CM | POA: Diagnosis not present

## 2023-12-26 DIAGNOSIS — L603 Nail dystrophy: Secondary | ICD-10-CM | POA: Diagnosis not present

## 2023-12-26 DIAGNOSIS — E1151 Type 2 diabetes mellitus with diabetic peripheral angiopathy without gangrene: Secondary | ICD-10-CM | POA: Diagnosis not present

## 2024-01-04 DIAGNOSIS — I10 Essential (primary) hypertension: Secondary | ICD-10-CM | POA: Diagnosis not present

## 2024-01-17 DIAGNOSIS — F411 Generalized anxiety disorder: Secondary | ICD-10-CM | POA: Diagnosis not present

## 2024-01-17 DIAGNOSIS — F330 Major depressive disorder, recurrent, mild: Secondary | ICD-10-CM | POA: Diagnosis not present

## 2024-01-19 DIAGNOSIS — L97212 Non-pressure chronic ulcer of right calf with fat layer exposed: Secondary | ICD-10-CM | POA: Diagnosis not present

## 2024-01-19 DIAGNOSIS — L97222 Non-pressure chronic ulcer of left calf with fat layer exposed: Secondary | ICD-10-CM | POA: Diagnosis not present

## 2024-01-24 DIAGNOSIS — F330 Major depressive disorder, recurrent, mild: Secondary | ICD-10-CM | POA: Diagnosis not present

## 2024-01-24 DIAGNOSIS — F411 Generalized anxiety disorder: Secondary | ICD-10-CM | POA: Diagnosis not present

## 2024-01-29 DIAGNOSIS — A4901 Methicillin susceptible Staphylococcus aureus infection, unspecified site: Secondary | ICD-10-CM | POA: Diagnosis not present

## 2024-01-29 DIAGNOSIS — H04129 Dry eye syndrome of unspecified lacrimal gland: Secondary | ICD-10-CM | POA: Diagnosis not present

## 2024-01-29 DIAGNOSIS — B379 Candidiasis, unspecified: Secondary | ICD-10-CM | POA: Diagnosis not present

## 2024-01-29 DIAGNOSIS — S81809D Unspecified open wound, unspecified lower leg, subsequent encounter: Secondary | ICD-10-CM | POA: Diagnosis not present

## 2024-02-01 DIAGNOSIS — G8929 Other chronic pain: Secondary | ICD-10-CM | POA: Diagnosis not present

## 2024-02-01 DIAGNOSIS — L299 Pruritus, unspecified: Secondary | ICD-10-CM | POA: Diagnosis not present

## 2024-02-01 DIAGNOSIS — I10 Essential (primary) hypertension: Secondary | ICD-10-CM | POA: Diagnosis not present

## 2024-02-02 DIAGNOSIS — I890 Lymphedema, not elsewhere classified: Secondary | ICD-10-CM | POA: Diagnosis not present

## 2024-02-02 DIAGNOSIS — R52 Pain, unspecified: Secondary | ICD-10-CM | POA: Diagnosis not present

## 2024-02-02 DIAGNOSIS — I10 Essential (primary) hypertension: Secondary | ICD-10-CM | POA: Diagnosis not present

## 2024-02-02 DIAGNOSIS — K5900 Constipation, unspecified: Secondary | ICD-10-CM | POA: Diagnosis not present

## 2024-02-02 DIAGNOSIS — E1129 Type 2 diabetes mellitus with other diabetic kidney complication: Secondary | ICD-10-CM | POA: Diagnosis not present

## 2024-02-02 DIAGNOSIS — E559 Vitamin D deficiency, unspecified: Secondary | ICD-10-CM | POA: Diagnosis not present

## 2024-02-02 DIAGNOSIS — N184 Chronic kidney disease, stage 4 (severe): Secondary | ICD-10-CM | POA: Diagnosis not present

## 2024-02-02 DIAGNOSIS — L03116 Cellulitis of left lower limb: Secondary | ICD-10-CM | POA: Diagnosis not present

## 2024-02-02 DIAGNOSIS — E1169 Type 2 diabetes mellitus with other specified complication: Secondary | ICD-10-CM | POA: Diagnosis not present

## 2024-02-02 DIAGNOSIS — E039 Hypothyroidism, unspecified: Secondary | ICD-10-CM | POA: Diagnosis not present

## 2024-02-02 DIAGNOSIS — R682 Dry mouth, unspecified: Secondary | ICD-10-CM | POA: Diagnosis not present

## 2024-02-05 DIAGNOSIS — A4901 Methicillin susceptible Staphylococcus aureus infection, unspecified site: Secondary | ICD-10-CM | POA: Diagnosis not present

## 2024-02-05 DIAGNOSIS — B379 Candidiasis, unspecified: Secondary | ICD-10-CM | POA: Diagnosis not present

## 2024-02-05 DIAGNOSIS — E11628 Type 2 diabetes mellitus with other skin complications: Secondary | ICD-10-CM | POA: Diagnosis not present

## 2024-02-05 DIAGNOSIS — S81809D Unspecified open wound, unspecified lower leg, subsequent encounter: Secondary | ICD-10-CM | POA: Diagnosis not present

## 2024-02-05 DIAGNOSIS — Z13220 Encounter for screening for lipoid disorders: Secondary | ICD-10-CM | POA: Diagnosis not present

## 2024-02-05 DIAGNOSIS — N184 Chronic kidney disease, stage 4 (severe): Secondary | ICD-10-CM | POA: Diagnosis not present

## 2024-02-05 DIAGNOSIS — D509 Iron deficiency anemia, unspecified: Secondary | ICD-10-CM | POA: Diagnosis not present

## 2024-02-05 DIAGNOSIS — Z794 Long term (current) use of insulin: Secondary | ICD-10-CM | POA: Diagnosis not present

## 2024-02-09 DIAGNOSIS — L03116 Cellulitis of left lower limb: Secondary | ICD-10-CM | POA: Diagnosis not present

## 2024-02-09 DIAGNOSIS — E559 Vitamin D deficiency, unspecified: Secondary | ICD-10-CM | POA: Diagnosis not present

## 2024-02-09 DIAGNOSIS — R682 Dry mouth, unspecified: Secondary | ICD-10-CM | POA: Diagnosis not present

## 2024-02-09 DIAGNOSIS — I890 Lymphedema, not elsewhere classified: Secondary | ICD-10-CM | POA: Diagnosis not present

## 2024-02-12 DIAGNOSIS — N179 Acute kidney failure, unspecified: Secondary | ICD-10-CM | POA: Diagnosis not present

## 2024-02-12 DIAGNOSIS — N184 Chronic kidney disease, stage 4 (severe): Secondary | ICD-10-CM | POA: Diagnosis not present

## 2024-02-13 ENCOUNTER — Inpatient Hospital Stay
Admission: EM | Admit: 2024-02-13 | Discharge: 2024-02-16 | DRG: 683 | Disposition: A | Discharge status: Skilled Nursing Facility | Source: Skilled Nursing Facility | Attending: Student | Admitting: Student

## 2024-02-13 ENCOUNTER — Other Ambulatory Visit: Payer: Self-pay

## 2024-02-13 ENCOUNTER — Emergency Department

## 2024-02-13 DIAGNOSIS — Z87442 Personal history of urinary calculi: Secondary | ICD-10-CM

## 2024-02-13 DIAGNOSIS — Z89411 Acquired absence of right great toe: Secondary | ICD-10-CM

## 2024-02-13 DIAGNOSIS — Z96641 Presence of right artificial hip joint: Secondary | ICD-10-CM | POA: Diagnosis not present

## 2024-02-13 DIAGNOSIS — N1832 Chronic kidney disease, stage 3b: Secondary | ICD-10-CM | POA: Diagnosis not present

## 2024-02-13 DIAGNOSIS — Z6841 Body Mass Index (BMI) 40.0 and over, adult: Secondary | ICD-10-CM | POA: Diagnosis not present

## 2024-02-13 DIAGNOSIS — K8000 Calculus of gallbladder with acute cholecystitis without obstruction: Secondary | ICD-10-CM | POA: Diagnosis not present

## 2024-02-13 DIAGNOSIS — I480 Paroxysmal atrial fibrillation: Secondary | ICD-10-CM | POA: Diagnosis not present

## 2024-02-13 DIAGNOSIS — Z79890 Hormone replacement therapy (postmenopausal): Secondary | ICD-10-CM

## 2024-02-13 DIAGNOSIS — I130 Hypertensive heart and chronic kidney disease with heart failure and stage 1 through stage 4 chronic kidney disease, or unspecified chronic kidney disease: Secondary | ICD-10-CM | POA: Diagnosis present

## 2024-02-13 DIAGNOSIS — E1169 Type 2 diabetes mellitus with other specified complication: Secondary | ICD-10-CM

## 2024-02-13 DIAGNOSIS — E1122 Type 2 diabetes mellitus with diabetic chronic kidney disease: Secondary | ICD-10-CM | POA: Diagnosis not present

## 2024-02-13 DIAGNOSIS — D631 Anemia in chronic kidney disease: Secondary | ICD-10-CM | POA: Diagnosis not present

## 2024-02-13 DIAGNOSIS — E1140 Type 2 diabetes mellitus with diabetic neuropathy, unspecified: Secondary | ICD-10-CM | POA: Diagnosis present

## 2024-02-13 DIAGNOSIS — Z888 Allergy status to other drugs, medicaments and biological substances status: Secondary | ICD-10-CM | POA: Diagnosis not present

## 2024-02-13 DIAGNOSIS — Z7901 Long term (current) use of anticoagulants: Secondary | ICD-10-CM

## 2024-02-13 DIAGNOSIS — Z79899 Other long term (current) drug therapy: Secondary | ICD-10-CM | POA: Diagnosis not present

## 2024-02-13 DIAGNOSIS — E1151 Type 2 diabetes mellitus with diabetic peripheral angiopathy without gangrene: Secondary | ICD-10-CM | POA: Diagnosis present

## 2024-02-13 DIAGNOSIS — E039 Hypothyroidism, unspecified: Secondary | ICD-10-CM | POA: Diagnosis not present

## 2024-02-13 DIAGNOSIS — R7889 Finding of other specified substances, not normally found in blood: Secondary | ICD-10-CM | POA: Diagnosis not present

## 2024-02-13 DIAGNOSIS — R000 Tachycardia, unspecified: Secondary | ICD-10-CM | POA: Diagnosis not present

## 2024-02-13 DIAGNOSIS — Z794 Long term (current) use of insulin: Secondary | ICD-10-CM | POA: Diagnosis not present

## 2024-02-13 DIAGNOSIS — E860 Dehydration: Secondary | ICD-10-CM | POA: Diagnosis not present

## 2024-02-13 DIAGNOSIS — Z7902 Long term (current) use of antithrombotics/antiplatelets: Secondary | ICD-10-CM | POA: Diagnosis not present

## 2024-02-13 DIAGNOSIS — I5032 Chronic diastolic (congestive) heart failure: Secondary | ICD-10-CM | POA: Diagnosis present

## 2024-02-13 DIAGNOSIS — Z7401 Bed confinement status: Secondary | ICD-10-CM | POA: Diagnosis not present

## 2024-02-13 DIAGNOSIS — E66813 Obesity, class 3: Secondary | ICD-10-CM | POA: Diagnosis not present

## 2024-02-13 DIAGNOSIS — Z89421 Acquired absence of other right toe(s): Secondary | ICD-10-CM | POA: Diagnosis not present

## 2024-02-13 DIAGNOSIS — Z743 Need for continuous supervision: Secondary | ICD-10-CM | POA: Diagnosis not present

## 2024-02-13 DIAGNOSIS — R0902 Hypoxemia: Secondary | ICD-10-CM | POA: Diagnosis not present

## 2024-02-13 DIAGNOSIS — N179 Acute kidney failure, unspecified: Secondary | ICD-10-CM | POA: Diagnosis not present

## 2024-02-13 DIAGNOSIS — R1031 Right lower quadrant pain: Secondary | ICD-10-CM | POA: Diagnosis not present

## 2024-02-13 DIAGNOSIS — N281 Cyst of kidney, acquired: Secondary | ICD-10-CM | POA: Diagnosis not present

## 2024-02-13 DIAGNOSIS — Z833 Family history of diabetes mellitus: Secondary | ICD-10-CM

## 2024-02-13 LAB — COMPREHENSIVE METABOLIC PANEL WITH GFR
ALT: 10 U/L (ref 0–44)
AST: 29 U/L (ref 15–41)
Albumin: 2.4 g/dL — ABNORMAL LOW (ref 3.5–5.0)
Alkaline Phosphatase: 65 U/L (ref 38–126)
Anion gap: 11 (ref 5–15)
BUN: 29 mg/dL — ABNORMAL HIGH (ref 8–23)
CO2: 26 mmol/L (ref 22–32)
Calcium: 8.7 mg/dL — ABNORMAL LOW (ref 8.9–10.3)
Chloride: 103 mmol/L (ref 98–111)
Creatinine, Ser: 3.54 mg/dL — ABNORMAL HIGH (ref 0.44–1.00)
GFR, Estimated: 13 mL/min — ABNORMAL LOW (ref >60)
Glucose, Bld: 177 mg/dL — ABNORMAL HIGH (ref 70–99)
Potassium: 4.9 mmol/L (ref 3.5–5.1)
Sodium: 140 mmol/L (ref 135–145)
Total Bilirubin: 0.4 mg/dL (ref 0.0–1.2)
Total Protein: 7.4 g/dL (ref 6.5–8.1)

## 2024-02-13 LAB — URINALYSIS, W/ REFLEX TO CULTURE (INFECTION SUSPECTED)
Bilirubin Urine: NEGATIVE
Glucose, UA: NEGATIVE mg/dL
Hgb urine dipstick: NEGATIVE
Ketones, ur: NEGATIVE mg/dL
Leukocytes,Ua: NEGATIVE
Nitrite: NEGATIVE
Protein, ur: 30 mg/dL — AB
Specific Gravity, Urine: 1.011 (ref 1.005–1.030)
pH: 6 (ref 5.0–8.0)

## 2024-02-13 LAB — CBC
HCT: 31.4 % — ABNORMAL LOW (ref 36.0–46.0)
Hemoglobin: 9.5 g/dL — ABNORMAL LOW (ref 12.0–15.0)
MCH: 25.3 pg — ABNORMAL LOW (ref 26.0–34.0)
MCHC: 30.3 g/dL (ref 30.0–36.0)
MCV: 83.7 fL (ref 80.0–100.0)
Platelets: 534 K/uL — ABNORMAL HIGH (ref 150–400)
RBC: 3.75 MIL/uL — ABNORMAL LOW (ref 3.87–5.11)
RDW: 16.1 % — ABNORMAL HIGH (ref 11.5–15.5)
WBC: 7.6 K/uL (ref 4.0–10.5)
nRBC: 0 % (ref 0.0–0.2)

## 2024-02-13 LAB — CBG MONITORING, ED: Glucose-Capillary: 122 mg/dL — ABNORMAL HIGH (ref 70–99)

## 2024-02-13 MED ORDER — SODIUM CHLORIDE 0.9 % IV BOLUS
500.0000 mL | Freq: Once | INTRAVENOUS | Status: AC
  Administered 2024-02-13: 500 mL via INTRAVENOUS

## 2024-02-13 MED ORDER — METOPROLOL SUCCINATE ER 25 MG PO TB24
12.5000 mg | ORAL_TABLET | Freq: Every day | ORAL | Status: DC
  Administered 2024-02-14 – 2024-02-16 (×3): 12.5 mg via ORAL
  Filled 2024-02-13 (×3): qty 1

## 2024-02-13 MED ORDER — FERROUS GLUCONATE 324 (38 FE) MG PO TABS
324.0000 mg | ORAL_TABLET | Freq: Every day | ORAL | Status: DC
  Administered 2024-02-14 – 2024-02-16 (×3): 324 mg via ORAL
  Filled 2024-02-13 (×4): qty 1

## 2024-02-13 MED ORDER — SODIUM CHLORIDE 0.9 % IV SOLN: INTRAVENOUS | Status: AC

## 2024-02-13 MED ORDER — LEVOTHYROXINE SODIUM 150 MCG PO TABS
150.0000 ug | ORAL_TABLET | Freq: Every day | ORAL | Status: DC
  Administered 2024-02-14 – 2024-02-16 (×3): 150 ug via ORAL
  Filled 2024-02-13: qty 1
  Filled 2024-02-13: qty 3
  Filled 2024-02-13 (×2): qty 1
  Filled 2024-02-13: qty 3

## 2024-02-13 MED ORDER — INSULIN ASPART 100 UNIT/ML IJ SOLN
0.0000 [IU] | Freq: Every day | INTRAMUSCULAR | Status: DC
  Administered 2024-02-15: 3 [IU] via SUBCUTANEOUS
  Filled 2024-02-13: qty 1

## 2024-02-13 MED ORDER — HEPARIN SODIUM (PORCINE) 5000 UNIT/ML IJ SOLN
5000.0000 [IU] | Freq: Three times a day (TID) | INTRAMUSCULAR | Status: DC
  Administered 2024-02-13 – 2024-02-15 (×7): 5000 [IU] via SUBCUTANEOUS
  Filled 2024-02-13 (×7): qty 1

## 2024-02-13 MED ORDER — GABAPENTIN 300 MG PO CAPS
300.0000 mg | ORAL_CAPSULE | Freq: Every day | ORAL | Status: DC
  Administered 2024-02-13 – 2024-02-15 (×3): 300 mg via ORAL
  Filled 2024-02-13 (×3): qty 1

## 2024-02-13 MED ORDER — CLOPIDOGREL BISULFATE 75 MG PO TABS
75.0000 mg | ORAL_TABLET | Freq: Every day | ORAL | Status: DC
Start: 2024-02-14 — End: 2024-02-17
  Administered 2024-02-14 – 2024-02-16 (×3): 75 mg via ORAL
  Filled 2024-02-13 (×3): qty 1

## 2024-02-13 MED ORDER — TRAMADOL HCL 50 MG PO TABS
50.0000 mg | ORAL_TABLET | Freq: Four times a day (QID) | ORAL | Status: DC | PRN
  Administered 2024-02-13 – 2024-02-15 (×3): 50 mg via ORAL
  Filled 2024-02-13 (×3): qty 1

## 2024-02-13 MED ORDER — INSULIN ASPART 100 UNIT/ML IJ SOLN
0.0000 [IU] | Freq: Three times a day (TID) | INTRAMUSCULAR | Status: DC
  Administered 2024-02-14 – 2024-02-15 (×3): 3 [IU] via SUBCUTANEOUS
  Administered 2024-02-15: 5 [IU] via SUBCUTANEOUS
  Administered 2024-02-16: 3 [IU] via SUBCUTANEOUS
  Administered 2024-02-16: 2 [IU] via SUBCUTANEOUS
  Administered 2024-02-16: 5 [IU] via SUBCUTANEOUS
  Filled 2024-02-13 (×7): qty 1

## 2024-02-13 NOTE — H&P (Signed)
 History and Physical    Patient: [name] FMW:[id number] DOB: [birth year] DOA: 02/13/2024 DOS: the patient was seen and examined on 02/13/2024 PCP: [facility]  Patient coming from: SNF  Chief Complaint: Abnormal lab results Chief Complaint  Patient presents with   Abnormal Labs   HPI: [name] is a 68 y.o. female with medical history significant of diabetes mellitus, CHF, hypertension, hypothyroidism, CKD who was brought in from her facility on account of elevated renal function.  At the time patient was being seen she denied nausea vomiting abdominal pain chest pain or cough. ED course: Upon arrival to the emergency room patient's temperature 98.4 respiratory rate 21 pulse 110 BP 155/85 Laboratory results showed creatinine of 3.5 with baseline creatinine of 1.5.  Patient looks clinically dehydrated in the setting of Lasix  use and as such initiated on IV fluid and TRH was contacted to admit patient for further management.  Review of Systems: As mentioned in the history of present illness. All other systems reviewed and are negative. Past Medical History:  Diagnosis Date   Acute congestive heart failure (HCC) 09/26/2023   Diabetes mellitus without complication (HCC) 2010   Hypertension    Hypothyroidism    Kidney stone 11/2014   history of/STAGE 4 KIDNEY DISEASE PER DR DOUGLAS   Lymphedema    Multiple open wounds of lower leg    PT BEING SEEN BY THE WOUND CENTER FOR CHRONIC BLISTERS PER PT   Past Surgical History:  Procedure Laterality Date   AMPUTATION Right 05/03/2021   Procedure: AMPUTATION RAY;  Surgeon: Lennie Barter, DPM;  Location: [facility] ORS;  Service: Podiatry;  Laterality: Right;  RIght Big Toe and Right Fifth Toe   IRRIGATION AND DEBRIDEMENT FOOT Bilateral 05/03/2021   Procedure: IRRIGATION AND DEBRIDEMENT FEET;  Surgeon: Lennie Barter, DPM;  Location: [facility] ORS;  Service: Podiatry;  Laterality: Bilateral;   LOWER EXTREMITY ANGIOGRAPHY Right  04/27/2021   Procedure: Lower Extremity Angiography;  Surgeon: Jama Cordella MATSU, MD;  Location: [facility] INVASIVE CV LAB;  Service: Cardiovascular;  Laterality: Right;   LOWER EXTREMITY ANGIOGRAPHY Left 04/30/2021   Procedure: Lower Extremity Angiography;  Surgeon: Marea Selinda RAMAN, MD;  Location: [facility] INVASIVE CV LAB;  Service: Cardiovascular;  Laterality: Left;   TOTAL HIP ARTHROPLASTY Right 05/28/2015   Procedure: TOTAL HIP ARTHROPLASTY ANTERIOR APPROACH;  Surgeon: Ozell Flake, MD;  Location: [facility] ORS;  Service: Orthopedics;  Laterality: Right;   Social History:  reports that she has never smoked. She has never used smokeless tobacco. She reports that she does not drink alcohol and does not use drugs.  Allergies  Allergen Reactions   Cefepime  Other (See Comments)    Encephalopathy -- precipitated by AKI and opioids    Family History  Problem Relation Age of Onset   Cancer Mother    Diabetes Father     Prior to Admission medications   Medication Sig Start Date End Date Taking? Authorizing Provider  acetaminophen  (TYLENOL ) 325 MG tablet Take 2 tablets (650 mg total) by mouth every 6 (six) hours as needed for mild pain (or Fever >/= 101). 05/18/21   Josette Ade, MD  apixaban  (ELIQUIS ) 5 MG TABS tablet Take 1 tablet (5 mg total) by mouth 2 (two) times daily. 10/02/23 11/01/23  Trudy Anthony HERO, MD  ascorbic acid  (VITAMIN C ) 500 MG tablet Take 1 tablet (500 mg total) by mouth daily. 11/03/21   Josette Ade, MD  bisacodyl  (DULCOLAX) 5 MG EC tablet Take 1 tablet (5 mg total)  by mouth daily as needed for moderate constipation. 12/15/23   Dorinda Drue DASEN, MD  blood glucose meter kit and supplies 1 each by Other route daily. ONE TOUCH ULTRA METER. Use as directed. E11.29 10/23/20   Joshua Cathryne BROCKS, MD  clopidogrel  (PLAVIX ) 75 MG tablet Take 1 tablet (75 mg total) by mouth daily with breakfast. 05/19/21   Josette Ade, MD  diclofenac  Sodium (VOLTAREN ) 1 % GEL Apply 2 g topically 3 (three)  times daily.    [provider]  doxycycline  (VIBRAMYCIN ) 100 MG capsule Take 100 mg by mouth 2 (two) times daily. 12/04/23   [provider]  ferrous gluconate  (FERGON) 324 MG tablet Take 324 mg by mouth daily with breakfast.    [provider]  fluticasone  (FLONASE ) 50 MCG/ACT nasal spray Place 1 spray into both nostrils daily. 10/11/23   [provider]  furosemide  (LASIX ) 20 MG tablet Take 1 tablet (20 mg total) by mouth 2 (two) times daily. 05/18/21   Josette Ade, MD  gabapentin  (NEURONTIN ) 300 MG capsule Take 1 capsule (300 mg total) by mouth at bedtime. Patient not taking: Reported on 12/09/2023 09/15/23   Fausto Sor A, DO  gentamicin  ointment (GARAMYCIN ) 0.1 % Apply 1 Application topically daily. 12/07/23   [provider]  glucose blood test strip 1 each by Other route daily. ONE TOUCH ULTRA TEST STRIPS E11.29 10/26/20   Joshua Cathryne BROCKS, MD  hydrOXYzine  (ATARAX ) 10 MG tablet Take 10 mg by mouth every 8 (eight) hours as needed for itching.    [provider]  insulin  aspart (NOVOLOG ) 100 UNIT/ML injection Inject 3 Units into the skin 3 (three) times daily with meals. Patient taking differently: Inject 2-12 Units into the skin 3 (three) times daily with meals. 05/18/21   Josette Ade, MD  Lancets [facility] ULTRASOFT) lancets 1 each by Other route daily. Use as instructed E11.29 10/23/20   Joshua Cathryne BROCKS, MD  LANTUS  SOLOSTAR 100 UNIT/ML Solostar Pen Inject 14 Units into the skin every 12 (twelve) hours. 09/15/23   Fausto Sor LABOR, DO  levothyroxine  (SYNTHROID ) 200 MCG tablet Take 200 mcg by mouth daily before breakfast.    [provider]  metoprolol  succinate (TOPROL -XL) 25 MG 24 hr tablet Take 0.5 tablets (12.5 mg total) by mouth daily with breakfast. 05/19/21   Josette Ade, MD  Multiple Vitamin (MULTIVITAMIN WITH MINERALS) TABS tablet Take 1 tablet by mouth daily. 11/04/21   Josette Ade, MD  NYSTATIN  powder Apply  1 application. topically 3 (three) times daily. 10/29/21   [provider]  ondansetron  (ZOFRAN -ODT) 4 MG disintegrating tablet Take 2 mg by mouth every 6 (six) hours as needed. 10/21/21   [provider]  oxyCODONE  (OXY IR/ROXICODONE ) 5 MG immediate release tablet Take 5 mg by mouth every 6 (six) hours as needed for moderate pain (pain score 4-6). 11/30/23   [provider]  senna-docusate (SENOKOT-S) 8.6-50 MG tablet Take 2 tablets by mouth at bedtime. Patient not taking: Reported on 12/09/2023    [provider]  Vitamin D, Ergocalciferol, (DRISDOL) 1.25 MG (50000 UNIT) CAPS capsule Take 50,000 Units by mouth every 7 (seven) days. Tuesday    [provider]  zinc  oxide 20 % ointment Apply 1 Application topically as needed for diaper changes.    [provider]    Physical Exam: Vitals:   02/13/24 0944 02/13/24 0945 02/13/24 0952  BP:  (!) 155/85   Pulse:  (!) 110   Resp:  (!) 21  Temp:   98.4 F (36.9 C)  TempSrc:   Oral  SpO2:  100%   Weight: 117.9 kg     GENERAL: Elderly female laying in bed EYES: Pupils equal, round HEENT: Head atraumatic, normocephalic. NECK:  Supple, no jugular venous distention. No thyroid  enlargement, no tenderness.  LUNGS: Decreased air entry especially at the bases bilaterally CARDIOVASCULAR: Regular rate and rhythm, S1, S2 normal. ABDOMEN: Soft, nondistended, nontender. Bowel sounds present. No organomegaly or mass.  EXTREMITIES: No pedal edema, cyanosis, or clubbing.  NEUROLOGIC: Cranial nerves II through XII are intact. Muscle strength 5/5 in all extremities. Sensation intact. Gait not checked.  PSYCHIATRIC: The patient is alert and oriented x 3.  Normal affect and good eye contact. SKIN: No obvious rash, lesion, or ulcer.  Data Reviewed:  CT scan of the abdomen reviewed that did not show any acute intracranial pathology however it did show Colecystolithiasis with no evidence of acute cholecystitis     Latest Ref Rng & Units 02/13/2024    9:50 AM 12/15/2023    2:52 AM 12/14/2023    3:15 AM  CBC  WBC 4.0 - 10.5 K/uL 7.6  7.8  6.7   Hemoglobin 12.0 - 15.0 g/dL 9.5  89.9  89.8   Hematocrit 36.0 - 46.0 % 31.4  32.4  32.8   Platelets 150 - 400 K/uL 534  446  442        Latest Ref Rng & Units 02/13/2024    9:50 AM 12/15/2023    2:52 AM 12/14/2023    3:15 AM  BMP  Glucose 70 - 99 mg/dL 822  845  878   BUN 8 - 23 mg/dL 29  20  16    Creatinine 0.44 - 1.00 mg/dL 6.45  8.61  8.47   Sodium 135 - 145 mmol/L 140  138  138   Potassium 3.5 - 5.1 mmol/L 4.9  3.9  4.5   Chloride 98 - 111 mmol/L 103  110  110   CO2 22 - 32 mmol/L 26  26  21    Calcium  8.9 - 10.3 mg/dL 8.7  8.9  9.0      Assessment and Plan:  AKI on CKD secondary to dehydration in the setting of Lasix  use Patient presented with creatinine of 3.5 however baseline creatinine of 1.5 Avoid nephrotoxic agents Renally dose all drugs Monitor renal function closely and if does not improve to consult nephrologist  Chronic diastolic congestive heart failure Echo in April 2025 showed EF 60 to 65% with grade 1 diastolic dysfunction Continue daily weight Monitor input and output  Essential hypertension Holding Lasix  Continue metoprolol   Normocytic anemia likely secondary to anemia of chronic disease No indication for transfusion at this time Monitor CBC closely  Hypothyroidism Continue levothyroxine   Diabetes mellitus type 2 with neuropathy Continue insulin  therapy Continue gabapentin   DVT prophylaxis-continue heparin    Advance Care Planning: Full code  Consults: None  Family Communication: None present at bedside according to the patient he does not have any children and her husband  Severity of Illness: The appropriate patient status for this patient is INPATIENT. Inpatient status is judged to be reasonable and necessary in order to provide the required intensity of service to ensure the patient's safety. The patient's  presenting symptoms, physical exam findings, and initial radiographic and laboratory data in the context of their chronic comorbidities is felt to place them at high risk for further clinical deterioration. Furthermore, it is not anticipated that the patient will be medically stable  for discharge from the hospital within 2 midnights of admission.   * I certify that at the point of admission it is my clinical judgment that the patient will require inpatient hospital care spanning beyond 2 midnights from the point of admission due to high intensity of service, high risk for further deterioration and high frequency of surveillance required.*  Author: Drue ONEIDA Potter, MD 02/13/2024 12:05 PM  For on call review [email or web address].

## 2024-02-13 NOTE — ED Triage Notes (Signed)
 PT arrived via EMS from [facility]. Per EMS staff advised them that pt has been refusing her doctor appointments.

## 2024-02-13 NOTE — ED Provider Notes (Signed)
 [facility] Provider Note    Event Date/Time   First MD Initiated Contact with Patient 02/13/24 0940     (approximate)   History   Abnormal Labs   HPI  [name] is a 68 year old female with history of HTN, diabetes, CKD presenting to the ER for evaluation of abnormal labs.  Per EMS patient was noted to have abnormal kidney labs.  Patient does arrive with paperwork which notes a creatinine of 3.94 with a BUN of 27 and sodium of 137 on labs drawn yesterday morning.  Patient reports normal p.o. intake.  Reports she had a wet diaper changed this morning.  Denies fever.  Denies nausea or vomiting.  EMS reported that staff said she had been refusing doctors appointments, but patient denies this to me.     Physical Exam   Triage Vital Signs: ED Triage Vitals  Encounter Vitals Group     BP 02/13/24 0945 (!) 155/85     Girls Systolic BP Percentile --      Girls Diastolic BP Percentile --      Boys Systolic BP Percentile --      Boys Diastolic BP Percentile --      Pulse Rate 02/13/24 0945 (!) 110     Resp 02/13/24 0945 (!) 21     Temp 02/13/24 0952 98.4 F (36.9 C)     Temp Source 02/13/24 0952 Oral     SpO2 02/13/24 0945 100 %     Weight 02/13/24 0944 260 lb (117.9 kg)     Height --      Head Circumference --      Peak Flow --      Pain Score 02/13/24 0944 0     Pain Loc --      Pain Education --      Exclude from Growth Chart --     Most recent vital signs: Vitals:   02/13/24 0945 02/13/24 0952  BP: (!) 155/85   Pulse: (!) 110   Resp: (!) 21   Temp:  98.4 F (36.9 C)  SpO2: 100%      General: Awake, interactive  CV:  Tachycardic with regular rhythm Resp:  Unlabored respirations, lungs clear to auscultation Abd:  Nondistended, soft, tender to palpation along the right side of the abdomen without rebound or guarding Neuro:  Symmetric facial movement, fluid speech MSK:  Will place dressings over the bilateral lower extremities,  changed yesterday.  Skin underneath dressings appears to be healing without obvious erythema, warmth   ED Results / Procedures / Treatments   Labs (all labs ordered are listed, but only abnormal results are displayed) Labs Reviewed  CBC - Abnormal; Notable for the following components:      Result Value   RBC 3.75 (*)    Hemoglobin 9.5 (*)    HCT 31.4 (*)    MCH 25.3 (*)    RDW 16.1 (*)    Platelets 534 (*)    All other components within normal limits  COMPREHENSIVE METABOLIC PANEL WITH GFR - Abnormal; Notable for the following components:   Glucose, Bld 177 (*)    BUN 29 (*)    Creatinine, Ser 3.54 (*)    Calcium  8.7 (*)    Albumin 2.4 (*)    GFR, Estimated 13 (*)    All other components within normal limits  URINALYSIS, W/ REFLEX TO CULTURE (INFECTION SUSPECTED)  CBC  CREATININE, SERUM     EKG EKG independently reviewed  and interpreted by myself demonstrates:  EKG demonstrate sinus tachycardia at a heart rate of 114, PR 150, cures 89, QTc 463, no acute ST changes  RADIOLOGY Imaging independently reviewed and interpreted by myself demonstrates:  CT abd pelvis without acute abnormality  Formal Radiology Read:  CT ABDOMEN PELVIS WO CONTRAST Result Date: 02/13/2024 CLINICAL DATA:  RLQ abdominal pain right sided abd pain, AKI EXAM: CT ABDOMEN AND PELVIS WITHOUT CONTRAST TECHNIQUE: Multidetector CT imaging of the abdomen and pelvis was performed following the standard protocol without IV contrast. RADIATION DOSE REDUCTION: This exam was performed according to the departmental dose-optimization program which includes automated exposure control, adjustment of the mA and/or kV according to patient size and/or use of iterative reconstruction technique. COMPARISON:  September 27, 2023 FINDINGS: Of note, the lack of intravenous contrast limits evaluation of the solid organ parenchyma and vascularity. Lower chest: No focal airspace consolidation or pleural effusion. Subsegmental atelectasis  in the right middle and right lower lobes. Multi-vessel coronary atherosclerosis. Hepatobiliary: No mass.Decompressed gallbladder with multiple radiopaque stones. No wall thickening. No intrahepatic or extrahepatic biliary ductal dilation. Pancreas: No mass or main ductal dilation. No peripancreatic inflammation or fluid collection. Spleen: Normal size. No mass. Adrenals/Urinary Tract: No adrenal masses. Renal cortical atrophy with a couple of small cysts in the right kidney, unchanged. No hydronephrosis or nephrolithiasis. The urinary bladder is decompressed and obscured by metallic streak artifact from orthopedic hardware. No focal abnormality visualized. Stomach/Bowel: The stomach is decompressed without focal abnormality. No small bowel wall thickening or inflammation. No small bowel obstruction.Normal appendix. Vascular/Lymphatic: No aortic aneurysm. Diffuse aortoiliac atherosclerosis. No intraabdominal or pelvic lymphadenopathy. Nonenlarged, peripherally calcified lymph nodes in the retroperitoneum and pelvis, which may be due to chronic kidney disease, prior granulomatous infection, or even remote, treated lymphoma. Reproductive: Age-related atrophy of the uterus and ovaries. No concerning adnexal mass. No free pelvic fluid. Other: No pneumoperitoneum, ascites, or mesenteric inflammation. Musculoskeletal: No acute fracture or destructive lesion. Right hip arthroplasty is anatomically aligned without dislocation. Multilevel degenerative disc disease of the spine. Diffuse osteopenia. Moderate left hip osteoarthritis. IMPRESSION: 1. No acute intra-abdominal or pelvic abnormality.  Normal appendix. 2. Cholecystolithiasis.  No changes of acute cholecystitis. Aortic Atherosclerosis (ICD10-I70.0). Electronically Signed   By: Rogelia Myers M.D.   On: 02/13/2024 10:43    PROCEDURES:  Critical Care performed: No  Procedures   MEDICATIONS ORDERED IN ED: Medications  heparin  injection 5,000 Units (has no  administration in time range)  0.9 %  sodium chloride  infusion (has no administration in time range)  sodium chloride  0.9 % bolus 500 mL (500 mLs Intravenous New Bag/Given 02/13/24 1144)     IMPRESSION / MDM / ASSESSMENT AND PLAN / ED COURSE  I reviewed the triage vital signs and the nursing notes.  Differential diagnosis includes, but is not limited to, dehydration, urinary retention, renal stone, UTI/pyelonephritis, other acute intra-abdominal process  Patient's presentation is most consistent with acute presentation with potential threat to life or bodily function.  68 year old female presenting to the emergency department with worsening renal function on outpatient labs.  Does have abdominal tenderness on exam here.  Will obtain repeat labs, CT to further evaluate.  Labs here demonstrate persistent AKI with creatinine of 3.54, baseline appears around 1.5.  CBC with normal white blood cell count, stable hemoglobin.  CT without acute findings, specifically without evidence of obstructing stone, hydronephrosis.  Patient denies significant change in her intake, consideration for medication associated renal injury.  I do note that she  is on Lasix . I reviewed her echo from 07/30/2023.  At that time, patient had an EF of 60 to 65% with grade 1 diastolic dysfunction, normal RV function.  In 2018 she had an EF of 45%.  I do note that in a progress note from 09/07/2023 she did have her Lasix  decreased due to worsening renal function increased to 1.89 from 1.47 at that time.  I am concerned that she may be over diuresed with her improving cardiac function which may be contributing to her AKI.  Does also have tachycardia here.  Will trial small fluid bolus.  Given her degree of AKI, do think she is appropriate for admission.  Will reach out to hospitalist team.  Case discussed with hospitalist team.  They will evaluate for anticipated admission.      FINAL CLINICAL IMPRESSION(S) / ED DIAGNOSES   Final  diagnoses:  AKI (acute kidney injury) (HCC)     Rx / DC Orders   ED Discharge Orders     None        Note:  This document was prepared using Dragon voice recognition software and may include unintentional dictation errors.   Levander Slate, MD 02/13/24 [phone]

## 2024-02-13 NOTE — ED Notes (Signed)
 Penne, RN requested in/out cath for urine collection. Pt was soiled with urine and stool, new chux pad and brief replaced after pericare. This tech, [place], EDT, Niels BANNING, Melissa, RN assisted in urine collection.

## 2024-02-13 NOTE — ED Notes (Signed)
 CCMD called at this time.

## 2024-02-14 DIAGNOSIS — N179 Acute kidney failure, unspecified: Secondary | ICD-10-CM | POA: Diagnosis not present

## 2024-02-14 LAB — CBC
HCT: 26.1 % — ABNORMAL LOW (ref 36.0–46.0)
HCT: 29 % — ABNORMAL LOW (ref 36.0–46.0)
Hemoglobin: 7.7 g/dL — ABNORMAL LOW (ref 12.0–15.0)
Hemoglobin: 8.9 g/dL — ABNORMAL LOW (ref 12.0–15.0)
MCH: 25.2 pg — ABNORMAL LOW (ref 26.0–34.0)
MCH: 25.6 pg — ABNORMAL LOW (ref 26.0–34.0)
MCHC: 29.5 g/dL — ABNORMAL LOW (ref 30.0–36.0)
MCHC: 30.7 g/dL (ref 30.0–36.0)
MCV: 85.3 fL (ref 80.0–100.0)
MCV: 83.6 fL (ref 80.0–100.0)
Platelets: 419 K/uL — ABNORMAL HIGH (ref 150–400)
Platelets: 451 K/uL — ABNORMAL HIGH (ref 150–400)
RBC: 3.06 MIL/uL — ABNORMAL LOW (ref 3.87–5.11)
RBC: 3.47 MIL/uL — ABNORMAL LOW (ref 3.87–5.11)
RDW: 16.2 % — ABNORMAL HIGH (ref 11.5–15.5)
RDW: 16 % — ABNORMAL HIGH (ref 11.5–15.5)
WBC: 6.7 K/uL (ref 4.0–10.5)
WBC: 6.5 K/uL (ref 4.0–10.5)
nRBC: 0 % (ref 0.0–0.2)
nRBC: 0 % (ref 0.0–0.2)

## 2024-02-14 LAB — BASIC METABOLIC PANEL WITH GFR
Anion gap: 11 (ref 5–15)
BUN: 29 mg/dL — ABNORMAL HIGH (ref 8–23)
CO2: 23 mmol/L (ref 22–32)
Calcium: 8.2 mg/dL — ABNORMAL LOW (ref 8.9–10.3)
Chloride: 106 mmol/L (ref 98–111)
Creatinine, Ser: 3.32 mg/dL — ABNORMAL HIGH (ref 0.44–1.00)
GFR, Estimated: 14 mL/min — ABNORMAL LOW (ref >60)
Glucose, Bld: 122 mg/dL — ABNORMAL HIGH (ref 70–99)
Potassium: 4.5 mmol/L (ref 3.5–5.1)
Sodium: 140 mmol/L (ref 135–145)

## 2024-02-14 LAB — CBG MONITORING, ED
Glucose-Capillary: 104 mg/dL — ABNORMAL HIGH (ref 70–99)
Glucose-Capillary: 141 mg/dL — ABNORMAL HIGH (ref 70–99)
Glucose-Capillary: 231 mg/dL — ABNORMAL HIGH (ref 70–99)

## 2024-02-14 LAB — GLUCOSE, CAPILLARY: Glucose-Capillary: 177 mg/dL — ABNORMAL HIGH (ref 70–99)

## 2024-02-14 NOTE — Evaluation (Signed)
 Physical Therapy Evaluation Patient Details Name: [name] MRN: [id number] DOB: December 21, 1955 Today's Date: 02/14/2024  History of Present Illness  [name] is a 68 y.o. female with medical history significant of diabetes mellitus, CHF, hypertension, hypothyroidism, CKD who was brought in from her facility on account of elevated renal function. Lives at [facility]  Clinical Impression  Patient received in bed, she reports she is bed bound at baseline. Does not get up at all at facility unless she has an appointment and is then hoyer lifted to wheelchair. Patient has B plantar flexion contractures of feet. Painful when attempt to flex. She is at her baseline mobility and has no skilled needs at this time. Signing off.          If plan is discharge home, recommend the following: Two people to help with walking and/or transfers;Two people to help with bathing/dressing/bathroom   Can travel by private vehicle   No    Equipment Recommendations None recommended by PT  Recommendations for Other Services       Functional Status Assessment Patient has not had a recent decline in their functional status     Precautions / Restrictions Precautions Precautions: Fall Restrictions Weight Bearing Restrictions Per Provider Order: No      Mobility  Bed Mobility Overal bed mobility: Needs Assistance Bed Mobility: Rolling Rolling: Max assist, Used rails              Transfers Overall transfer level: Needs assistance   Transfers: Bed to chair/wheelchair/BSC             General transfer comment: Deitra at baseline    Ambulation/Gait               General Gait Details: patient is not ambulatory at [employer]     Tilt Bed    Modified Rankin (Stroke Patients Only)       Balance                                             Pertinent Vitals/Pain Pain Assessment Pain Assessment: No/denies  pain    Home Living Family/patient expects to be discharged to:: Skilled nursing facility                        Prior Function Prior Level of Function : Needs assist             Mobility Comments: Patient is bed bound at basline. Requires hoyer for oob activity which she reports only happens if she has an appointment. ADLs Comments: independent with feeding, requires assistance for cleaning up ( wears brief in bed), dressing     Extremity/Trunk Assessment   Upper Extremity Assessment Upper Extremity Assessment: Defer to OT evaluation    Lower Extremity Assessment Lower Extremity Assessment: Generalized weakness;LLE deficits/detail;RLE deficits/detail RLE Deficits / Details: patient has B Plantar flexion contractures. Unable to lift legs off bed. RLE Coordination: decreased gross motor LLE Coordination: decreased gross motor    Cervical / Trunk Assessment Cervical / Trunk Assessment: Normal  Communication   Communication Communication: No apparent difficulties    Cognition Arousal: Alert Behavior During Therapy: Flat affect   PT - Cognitive impairments: No apparent impairments  Following commands: Intact       Cueing Cueing Techniques: Verbal cues     General Comments      Exercises     Assessment/Plan    PT Assessment All further PT needs can be met in the next venue of care;Patient does not need any further PT services  PT Problem List         PT Treatment Interventions      PT Goals (Current goals can be found in the Care Plan section)  Acute Rehab PT Goals Patient Stated Goal: return to [facility] PT Goal Formulation: With patient Time For Goal Achievement: 02/17/24 Potential to Achieve Goals: Good    Frequency       Co-evaluation               AM-PAC PT 6 Clicks Mobility  Outcome Measure Help needed turning from your back to your side while in a flat bed without using bedrails?:  Total Help needed moving from lying on your back to sitting on the side of a flat bed without using bedrails?: Total Help needed moving to and from a bed to a chair (including a wheelchair)?: Total Help needed standing up from a chair using your arms (e.g., wheelchair or bedside chair)?: Total Help needed to walk in hospital room?: Total Help needed climbing 3-5 steps with a railing? : Total 6 Click Score: 6    End of Session   Activity Tolerance: Patient tolerated treatment well Patient left: in bed Nurse Communication: Need for lift equipment;Mobility status      Time: 8962-8952 PT Time Calculation (min) (ACUTE ONLY): 10 min   Charges:   PT Evaluation $PT Eval Low Complexity: 1 Low   PT General Charges $$ ACUTE PT VISIT: 1 Visit         Chanda Laperle, PT, GCS 02/14/24,10:54 AM

## 2024-02-14 NOTE — Progress Notes (Addendum)
 Progress Note   Patient: [name] FMW:[id number] DOB: [birth year] DOA: 02/13/2024     1 DOS: the patient was seen and examined on 02/14/2024   Brief hospital course: HPI: BRINKLEE CISSE is a 68 y.o. female with medical history significant of diabetes mellitus, CHF, hypertension, hypothyroidism, CKD who was brought in from her facility on account of elevated renal function.  At the time patient was being seen she denied nausea vomiting abdominal pain chest pain or cough. ED course: Upon arrival to the emergency room patient's temperature 98.4 respiratory rate 21 pulse 110 BP 155/85 Laboratory results showed creatinine of 3.5 with baseline creatinine of 1.5.  Patient looks clinically dehydrated in the setting of Lasix  use and as such initiated on IV fluid and [facility] was contacted to admit patient for further management.   Assessment and Plan: AKI on CKD 3b  Unclear etiology.  Imaging of her abdomen and pelvis showed no evidence of hydronephrosis. Improved with IV fluids. Likely related to overdiuresis.   HFimEF Echo in April 2025 showed EF 60 to 65% with grade 1 diastolic dysfunction Continue daily weight Monitor input and output Hold lasix   HTN Continue metoprolol    Normocytic anemia Likely due to anemia of chronic kidney disease - Follow-up iron  panel - Transfuse for hemoglobin less than 7  Hypothyroidism Continue home levothyroxine   Type 2 diabetes complicated by neuropathy Insulin  therapy and gabapentin   PAF In sinus.  On metoprolol  and eliquis .  High bleeding risk. But appears to be on eliquis . Will resume in the AM.    PAD On plavix .   Obesity, Class III, BMI 40-49.9 (morbid obesity) This complicates overall care and prognosis.      Subjective: Reports eating and drinking like normal at facility.  No complaints.    Physical Exam: Vitals:   02/14/24 1500 02/14/24 1516 02/14/24 1730 02/14/24 1949  BP: (!) 126/49 (!) 126/49 (!) 136/58   Pulse: 86 96  90   Resp: 16 16 18    Temp:  98.8 F (37.1 C)  98.9 F (37.2 C)  TempSrc:  Oral  Oral  SpO2: 97% 98% 99%   Weight:       Physical Exam  Constitutional: In no distress.  Cardiovascular: Normal rate, regular rhythm.  Pulmonary: Non labored breathing on room air, no wheezing or rales.  Abdominal: Soft. Normal bowel sounds. Non distended and non tender Musculoskeletal: Normal range of motion.     Neurological: Alert and oriented to person, place, and time. Non focal  Skin: Skin is warm and dry. BLE bandaged c/d/I  Data Reviewed:     Latest Ref Rng & Units 02/14/2024    5:16 AM 02/13/2024    9:50 AM 12/15/2023    2:52 AM  BMP  Glucose 70 - 99 mg/dL 877  822  845   BUN 8 - 23 mg/dL 29  29  20    Creatinine 0.44 - 1.00 mg/dL 6.67  6.45  8.61   Sodium 135 - 145 mmol/L 140  140  138   Potassium 3.5 - 5.1 mmol/L 4.5  4.9  3.9   Chloride 98 - 111 mmol/L 106  103  110   CO2 22 - 32 mmol/L 23  26  26    Calcium  8.9 - 10.3 mg/dL 8.2  8.7  8.9      Family Communication: None at bedside.   Disposition: Status is: Inpatient Further work up of AKI   Planned Discharge Destination: Skilled nursing facility    Time spent:  35 minutes  Author: Alban Pepper, MD 02/14/2024 8:08 PM  For on call review [email or web address].

## 2024-02-14 NOTE — Hospital Course (Signed)
 [facility] years t [facility]. Eating drinking like normal.   Wounds on ble. Wrapped at nursing home.    Physical Exam  Constitutional: In no distress.  Cardiovascular: Normal rate, regular rhythm.  Pulmonary: Non labored breathing on room air, no wheezing or rales.  Abdominal: Soft. Normal bowel sounds. Non distended and non tender Musculoskeletal: Normal range of motion.     Neurological: Alert and oriented to person, place, and time. Non focal  Skin: Skin is warm and dry. BLE wrapped.

## 2024-02-14 NOTE — Evaluation (Signed)
 Occupational Therapy Evaluation Patient Details Name: [name] MRN: [id number] DOB: 1956/02/14 Today's Date: 02/14/2024   History of Present Illness   [name] is a 68 y.o. female with medical history significant of diabetes mellitus, CHF, hypertension, hypothyroidism, CKD who was brought in from her facility on account of elevated renal function. Lives at [facility]     Clinical Impressions Pt seen for OT evaluation this date. Prior to hospital admission, pt was living at [facility] where she was receiving total assistance at bed level for ADLs and transfers completed via mechanical dependent lift, set-up for self feeding.  Pt remains set-up for self feeding, has not experienced a change or decline in functional related to functional BUE ROM/strength and continued to require near total assistance with ADLs at bedlevel. No skilled OT needs identified. Will sign off. Please re-consult if additional OT needs arise.      If plan is discharge home, recommend the following:   A lot of help with bathing/dressing/bathroom;A lot of help with walking and/or transfers     Functional Status Assessment   Patient has not had a recent decline in their functional status     Equipment Recommendations   None recommended by OT     Recommendations for Other Services         Precautions/Restrictions   Precautions Precautions: Fall Restrictions Weight Bearing Restrictions Per Provider Order: No     Mobility Bed Mobility                    Transfers Overall transfer level: Needs assistance   Transfers: Bed to chair/wheelchair/BSC             General transfer comment: Mechanical dependent lift at baseline      Balance Overall balance assessment: Needs assistance (No change from baseline, pt at bedlevel)                                         ADL either performed or assessed with clinical judgement   ADL  Overall ADL's : At baseline                                       General ADL Comments: Pt is Max to total care for ADLs at bedlevel, mechanical dependent lift for all transfers and remains set-up for self feeding.  Pt presents at baseline.     Vision Patient Visual Report: No change from baseline       Perception         Praxis         Pertinent Vitals/Pain Pain Assessment Pain Assessment: No/denies pain     Extremity/Trunk Assessment Upper Extremity Assessment Upper Extremity Assessment: Generalized weakness;Right hand dominant (Pt is at baseline for functional strength, ROM in BUEs, and gross/fine motor coordination.  Able to engage in self feeding with set-up which is at baseline)   Lower Extremity Assessment Lower Extremity Assessment: Generalized weakness;LLE deficits/detail;RLE deficits/detail RLE Deficits / Details: patient has B Plantar flexion contractures. Unable to lift legs off bed. RLE Coordination: decreased gross motor LLE Coordination: decreased gross motor   Cervical / Trunk Assessment Cervical / Trunk Assessment: Normal   Communication Communication Communication: No apparent difficulties   Cognition Arousal: Alert Behavior During Therapy: Flat affect  Following commands: Intact       Cueing  General Comments   Cueing Techniques: Verbal cues      Exercises Other Exercises Other Exercises: No change from baseline, pt at bedlevel for all areas of function   Shoulder Instructions      Home Living Family/patient expects to be discharged to:: Skilled nursing facility                                 Additional Comments: Pt lives at [facility] nursing facility, based on previously documented notation, pt requires hoyer lift for out of bed, assist for all dressing/bathing at bed level, will feed herself with set up and talk on the phone; Nurse reports pt is  typically alert and oriented x4      Prior Functioning/Environment Prior Level of Function : Needs assist             Mobility Comments: Patient is bed bound at baseline. Requires hoyer for OOB activity which she reports only happens if she has an appointment. ADLs Comments: Independent with feeding, requires assistance for cleaning up ( wears brief in bed), assist for all dressing/bathing at bed level.    OT Problem List:     OT Treatment/Interventions:        OT Goals(Current goals can be found in the care plan section)   Acute Rehab OT Goals Patient Stated Goal: No goal stated OT Goal Formulation: With patient Time For Goal Achievement: 02/14/24 Potential to Achieve Goals: Fair   OT Frequency:       Co-evaluation              AM-PAC OT 6 Clicks Daily Activity     Outcome Measure Help from another person eating meals?: A Little Help from another person taking care of personal grooming?: A Lot Help from another person toileting, which includes using toliet, bedpan, or urinal?: Total Help from another person bathing (including washing, rinsing, drying)?: A Lot Help from another person to put on and taking off regular upper body clothing?: A Lot Help from another person to put on and taking off regular lower body clothing?: Total 6 Click Score: 11   End of Session Nurse Communication: Mobility status  Activity Tolerance:   Patient left: in bed;with call bell/phone within reach  OT Visit Diagnosis: Muscle weakness (generalized) (M62.81)                Time: 8872-8863 OT Time Calculation (min): 9 min Charges:  OT General Charges $OT Visit: 1 Visit OT Evaluation $OT Eval Low Complexity: 1 Low OT Treatments $Self Care/Home Management : 8-22 mins  [name] OTR/L   [name] 02/14/2024, 12:13 PM

## 2024-02-15 DIAGNOSIS — N179 Acute kidney failure, unspecified: Secondary | ICD-10-CM | POA: Diagnosis not present

## 2024-02-15 LAB — CBC
HCT: 28.3 % — ABNORMAL LOW (ref 36.0–46.0)
Hemoglobin: 8.5 g/dL — ABNORMAL LOW (ref 12.0–15.0)
MCH: 25.2 pg — ABNORMAL LOW (ref 26.0–34.0)
MCHC: 30 g/dL (ref 30.0–36.0)
MCV: 84 fL (ref 80.0–100.0)
Platelets: 469 K/uL — ABNORMAL HIGH (ref 150–400)
RBC: 3.37 MIL/uL — ABNORMAL LOW (ref 3.87–5.11)
RDW: 15.9 % — ABNORMAL HIGH (ref 11.5–15.5)
WBC: 7.5 K/uL (ref 4.0–10.5)
nRBC: 0 % (ref 0.0–0.2)

## 2024-02-15 LAB — BASIC METABOLIC PANEL WITH GFR
Anion gap: 12 (ref 5–15)
BUN: 30 mg/dL — ABNORMAL HIGH (ref 8–23)
CO2: 21 mmol/L — ABNORMAL LOW (ref 22–32)
Calcium: 8.6 mg/dL — ABNORMAL LOW (ref 8.9–10.3)
Chloride: 107 mmol/L (ref 98–111)
Creatinine, Ser: 3.23 mg/dL — ABNORMAL HIGH (ref 0.44–1.00)
GFR, Estimated: 15 mL/min — ABNORMAL LOW (ref >60)
Glucose, Bld: 232 mg/dL — ABNORMAL HIGH (ref 70–99)
Potassium: 4.3 mmol/L (ref 3.5–5.1)
Sodium: 140 mmol/L (ref 135–145)

## 2024-02-15 LAB — GLUCOSE, CAPILLARY
Glucose-Capillary: 163 mg/dL — ABNORMAL HIGH (ref 70–99)
Glucose-Capillary: 208 mg/dL — ABNORMAL HIGH (ref 70–99)
Glucose-Capillary: 262 mg/dL — ABNORMAL HIGH (ref 70–99)
Glucose-Capillary: 230 mg/dL — ABNORMAL HIGH (ref 70–99)
Glucose-Capillary: 258 mg/dL — ABNORMAL HIGH (ref 70–99)

## 2024-02-15 LAB — IRON AND TIBC
Iron: 18 ug/dL — ABNORMAL LOW (ref 28–170)
Saturation Ratios: 7 % — ABNORMAL LOW (ref 10.4–31.8)
TIBC: 260 ug/dL (ref 250–450)
UIBC: 242 ug/dL

## 2024-02-15 LAB — FERRITIN: Ferritin: 25 ng/mL (ref 11–307)

## 2024-02-15 LAB — MRSA NEXT GEN BY PCR, NASAL: MRSA by PCR Next Gen: NOT DETECTED

## 2024-02-15 LAB — PHOSPHORUS: Phosphorus: 4.2 mg/dL (ref 2.5–4.6)

## 2024-02-15 MED ORDER — INSULIN GLARGINE 100 UNIT/ML ~~LOC~~ SOLN
12.0000 [IU] | Freq: Every day | SUBCUTANEOUS | Status: DC
  Administered 2024-02-16: 12 [IU] via SUBCUTANEOUS
  Filled 2024-02-15: qty 0.12

## 2024-02-15 MED ORDER — IRON SUCROSE 300 MG IVPB - SIMPLE MED
300.0000 mg | Freq: Once | Status: AC
  Administered 2024-02-15: 300 mg via INTRAVENOUS
  Filled 2024-02-15: qty 265

## 2024-02-15 MED ORDER — INSULIN GLARGINE 100 UNIT/ML ~~LOC~~ SOLN
14.0000 [IU] | Freq: Two times a day (BID) | SUBCUTANEOUS | Status: DC
  Administered 2024-02-15: 14 [IU] via SUBCUTANEOUS
  Filled 2024-02-15 (×2): qty 0.14

## 2024-02-15 MED ORDER — APIXABAN 5 MG PO TABS
5.0000 mg | ORAL_TABLET | Freq: Two times a day (BID) | ORAL | Status: DC
  Administered 2024-02-16: 5 mg via ORAL
  Filled 2024-02-15: qty 1

## 2024-02-15 NOTE — Progress Notes (Addendum)
 OT Cancellation Note  Patient Details Name: [name] MRN: [id number] DOB: May 27, 1956   Cancelled Treatment:    Reason Eval/Treat Not Completed: OT screened, no needs identified, will sign off. Duplicate orders received. Pt evaluated by OT this admission and signed off as pt is at baseline for ADLs and mobility. Pt is LTC resident, bed bound at baseline, dependent hoyer for all mobility needs, does not demonstrate ability to make functional gains, will sign off. Please see 02/14/24 evaluation for recommendations.    Elston Slot, M.S. OTR/L  02/15/24, 3:18 PM  ascom [phone]

## 2024-02-15 NOTE — Consult Note (Addendum)
 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  She states they are changed daily at her facility prior to admission and she has recently completed a round of antibiotics.  Left and right anterior legs with generlized edema and erythemia and multiple areas of dry brown and dark red scabbed areas and scattered areas of red moist partial thickness wounds Left and right posterior calves with extensive areas of full thickness wounds; each side is approx 20X10X.3cm, large amt blood-tinged drainage, no active bleeding, very painful to touch when exposed to air. Re-applied dressings with assistance of the bedside nurses, continuing the previous plan of care she was ordered prior to admission.   Topical treatment orders provided for bedside nurses to perform as follows:  Bedside nurses, please change dressings to bilat legs Q day as follows: Moisten all previous dressings with generous amt NS to ast with removal Cleanse by gently patting legs using Vache cleanser. Apply 3 Aquacel Soila # [id number]) to each posterior leg open wounds Apply 3 ABD pads over Aquacel to each posterior leg wounds Apply 1 Xeroform gauze to each anterior leg scabbed areas, cover with 2 4X4s on each side Apply kerlex over all dressings Apply Ace wrap over kerlex.     Please re-consult if further assistance is needed.  Thank-you,  Stephane Fought MSN, RN, CWOCN, CWCN-AP, CNS Contact Mon-Fri 0700-1500: [phone]

## 2024-02-15 NOTE — Inpatient Diabetes Management (Signed)
 Inpatient Diabetes Program Recommendations  AACE/ADA: New Consensus Statement on Inpatient Glycemic Control (2015)  Target Ranges:  Prepandial:   less than 140 mg/dL      Peak postprandial:   less than 180 mg/dL (1-2 hours)      Critically ill patients:  140 - 180 mg/dL   Lab Results  Component Value Date   GLUCAP 208 (H) 02/15/2024   HGBA1C 8.1 (H) 09/06/2023    Review of Glycemic Control  Latest Reference Range & Units 02/14/24 11:44 02/14/24 16:18 02/14/24 22:43 02/15/24 08:29 02/15/24 09:00  Glucose-Capillary 70 - 99 mg/dL 858 (H) 768 (H) 822 (H) 163 (H) 208 (H)   Diabetes history: DM 2 Outpatient Diabetes medications:  Lantus  14 units bid, Novolog  3 units tid with meals  Current orders for Inpatient glycemic control:  Novolog  0-9 units tid with meals and HS Lantus  14 units bid Inpatient Diabetes Program Recommendations:    Consider reducing Lantus  to 12 units once daily.   Thanks,  Randall Bullocks, RN, BC-ADM Inpatient Diabetes Coordinator Pager [phone]  (8a-5p)

## 2024-02-15 NOTE — NC FL2 (Signed)
 [place]  MEDICAID FL2 LEVEL OF CARE FORM     IDENTIFICATION  Patient Name: [name] Birthdate: [birth year] Sex: female Admission Date (Current Location): 02/13/2024  [facility] and [place] Number:  Chiropodist and Address:  [facility], [address], [place], [place] [address]      Provider Number: [id number]  Attending Physician Name and Address:  Franchot Novel, MD  Relative Name and Phone Number:       Current Level of Care: Hospital Recommended Level of Care: Skilled Nursing Facility Prior Approval Number:    Date Approved/Denied:   PASRR Number: [id number]  Discharge Plan: SNF    Current Diagnoses: Patient Active Problem List   Diagnosis Date Noted   AKI (acute kidney injury) (HCC) 02/13/2024   Cellulitis of left lower extremity 12/08/2023   Leg pain 12/08/2023   Hyperkalemia 12/08/2023   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) 10/02/2023   Altered mental status 09/29/2023   Acute congestive heart failure (HCC) 09/26/2023   Acute encephalopathy 09/24/2023   Multiple open wounds of lower leg, initial encounter 09/12/2023   Thrombocytosis 09/10/2023   Anemia of chronic disease 09/10/2023   Osteomyelitis of left fibula (HCC) 09/09/2023   Type II diabetes mellitus with renal manifestations (HCC) 09/04/2023   Chronic systolic CHF (congestive heart failure) (HCC) 09/04/2023   Obesity, Class III, BMI 40-49.9 (morbid obesity) 09/04/2023   DKA (diabetic ketoacidosis) (HCC) 10/30/2021   PVD (peripheral vascular disease) (HCC)    Vertigo    Sepsis without acute organ dysfunction (HCC)    Acute kidney injury superimposed on CKD (HCC)    Adjustment disorder with anxiety 04/21/2021   Sepsis (HCC) 04/17/2021   Pressure injury of skin 04/17/2021   Left leg pain 04/16/2021   Morbid obesity (HCC) 10/24/2017   Chronic kidney disease, stage IV (severe) (HCC) 10/24/2017   Hypothyroidism 11/15/2016    Edema 11/15/2016   Type 2 diabetes mellitus with hyperlipidemia (HCC) 11/15/2016   Essential hypertension 12/09/2015   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) 06/26/2015   Pressure ulcer 05/31/2015   Primary osteoarthritis of right hip 05/28/2015    Orientation RESPIRATION BLADDER Height & Weight     Self, Time, Situation, Place  Normal Incontinent, External catheter Weight: 260 lb 12.9 oz (118.3 kg) Height:     BEHAVIORAL SYMPTOMS/MOOD NEUROLOGICAL BOWEL NUTRITION STATUS   (None)  (None) Continent Diet (Carb modified)  AMBULATORY STATUS COMMUNICATION OF NEEDS Skin     Verbally Other (Comment), PU Stage and Appropriate Care (Erythema/redness, weeping. Venous stasis ulcers on both pretibials. Wound on left thigh (foam).) PU Stage 1 Dressing:  (Coccyx: Foam.)                     Personal Care Assistance Level of Assistance              Functional Limitations Info  Sight, Hearing, Speech Sight Info: Adequate Hearing Info: Adequate Speech Info: Adequate    SPECIAL CARE FACTORS FREQUENCY                       Contractures Contractures Info: Present    Additional Factors Info  Code Status, Allergies Code Status Info: Full code Allergies Info: Cefepime            Current Medications (02/15/2024):  This is the current hospital active medication list Current Facility-Administered Medications  Medication Dose Route Frequency Provider Last Rate  Last Admin   clopidogrel  (PLAVIX ) tablet 75 mg  75 mg Oral Q breakfast Dorinda Homans T, MD   75 mg at 02/15/24 0930   ferrous gluconate  (FERGON) tablet 324 mg  324 mg Oral Q breakfast Dorinda Homans T, MD   324 mg at 02/15/24 0931   gabapentin  (NEURONTIN ) capsule 300 mg  300 mg Oral QHS Dorinda Homans T, MD   300 mg at 02/14/24 2252   heparin  injection 5,000 Units  5,000 Units Subcutaneous Q8H Dorinda Homans T, MD   5,000 Units at 02/15/24 9460   insulin  aspart (novoLOG ) injection 0-5  Units  0-5 Units Subcutaneous QHS Dorinda Homans T, MD       insulin  aspart (novoLOG ) injection 0-9 Units  0-9 Units Subcutaneous TID WC Dorinda Homans DASEN, MD   5 Units at 02/15/24 1231   [START ON 02/16/2024] insulin  glargine (LANTUS ) injection 12 Units  12 Units Subcutaneous Daily Franchot Novel, MD       levothyroxine  (SYNTHROID ) tablet 150 mcg  150 mcg Oral Q0600 Djan, Prince T, MD   150 mcg at 02/15/24 9461   metoprolol  succinate (TOPROL -XL) 24 hr tablet 12.5 mg  12.5 mg Oral Q breakfast Djan, Prince T, MD   12.5 mg at 02/15/24 0930   traMADol  (ULTRAM ) tablet 50 mg  50 mg Oral Q6H PRN Dorinda Homans DASEN, MD   50 mg at 02/15/24 9460     Discharge Medications: Please see discharge summary for a list of discharge medications.  Relevant Imaging Results:  Relevant Lab Results:   Additional Information SS#: [id number]  Lauraine JAYSON Carpen, LCSW

## 2024-02-15 NOTE — Care Management Important Message (Signed)
 Important Message  Patient Details  Name: [name] MRN: [id number] Date of Birth: 21-Nov-1955   Important Message Given:  Yes - Medicare IM     [name] 02/15/2024, 12:55 PM

## 2024-02-15 NOTE — Progress Notes (Signed)
 Progress Note   Patient: [name] FMW:[id number] DOB: 1956/01/22 DOA: 02/13/2024     2 DOS: the patient was seen and examined on 02/15/2024   Brief hospital course: HPI: [name] is a 68 y.o. female with medical history significant of diabetes mellitus, CHF, hypertension, hypothyroidism, CKD who was brought in from her facility on account of elevated renal function.  At the time patient was being seen she denied nausea vomiting abdominal pain chest pain or cough. ED course: Upon arrival to the emergency room patient's temperature 98.4 respiratory rate 21 pulse 110 BP 155/85 Laboratory results showed creatinine of 3.5 with baseline creatinine of 1.5.  Patient looks clinically dehydrated in the setting of Lasix  use and as such initiated on IV fluid and [facility] was contacted to admit patient for further management.   Assessment and Plan: AKI on CKD 3b  Unclear etiology.  Imaging of her abdomen and pelvis showed no evidence of hydronephrosis. Improved with IV fluids. Likely related to overdiuresis. Improved this a.m. Continue to monitor off diuresis   HFimEF Echo in April 2025 showed EF 60 to 65% with grade 1 diastolic dysfunction Continue daily weight Monitor input and output Hold lasix   HTN Continue metoprolol    Normocytic anemia Likely due to anemia of chronic kidney disease Iron /TIBC/Ferritin/ %Sat    Component Value Date/Time   IRON  18 (L) 02/15/2024 0447   TIBC 260 02/15/2024 0447   FERRITIN 25 02/15/2024 0447   IRONPCTSAT 7 (L) 02/15/2024 0447   - Transfuse for hemoglobin less than 7 - Continue oral iron  supplementation  Hypothyroidism Continue home levothyroxine   Type 2 diabetes complicated by neuropathy Insulin  therapy and gabapentin   PAF In sinus.  On metoprolol  and eliquis .  High bleeding risk. But appears to be on eliquis . Will resume in the AM.    PAD On plavix .   Obesity, Class III, BMI 40-49.9 (morbid obesity) This complicates overall  care and prognosis.      Subjective: No complaints.    Physical Exam: Vitals:   02/15/24 0818 02/15/24 0915 02/15/24 0930 02/15/24 1203  BP: (!) 132/49  (!) 132/49 135/67  Pulse: 80  80   Resp: 14 15 15 16   Temp: 99.2 F (37.3 C)   (!) 97.5 F (36.4 C)  TempSrc: Oral   Oral  SpO2: 95%     Weight:        Physical Exam  Constitutional: In no distress.  Cardiovascular: Normal rate, regular rhythm. Trace  lower extremity edema  Pulmonary: Non labored breathing on room air, no wheezing or rales.  No lower extremity edema Abdominal: Soft.  Non distended and non tender Musculoskeletal: Normal range of motion.     Neurological: Alert and oriented to person, place, and time. Non focal  Skin: Skin is warm and dry. BLE bandaged c/d/I   Data Reviewed:     Latest Ref Rng & Units 02/15/2024    4:47 AM 02/14/2024    5:16 AM 02/13/2024    9:50 AM  BMP  Glucose 70 - 99 mg/dL 767  877  822   BUN 8 - 23 mg/dL 30  29  29    Creatinine 0.44 - 1.00 mg/dL 6.76  6.67  6.45   Sodium 135 - 145 mmol/L 140  140  140   Potassium 3.5 - 5.1 mmol/L 4.3  4.5  4.9   Chloride 98 - 111 mmol/L 107  106  103   CO2 22 - 32 mmol/L 21  23  26  Calcium  8.9 - 10.3 mg/dL 8.6  8.2  8.7      Family Communication: None at bedside.   Disposition: Status is: Inpatient Further work up of AKI   Planned Discharge Destination: Skilled nursing facility    Time spent: 35 minutes  Author: Alban Pepper, MD 02/15/2024 5:51 PM  For on call review [email or web address].

## 2024-02-15 NOTE — Progress Notes (Signed)
 pt. has bilateral lower extremity wounds with noted weeping. Prior admission dressing intact, and refused to take dressing off for assessment. Wound consult order for wound assessment.

## 2024-02-15 NOTE — Plan of Care (Signed)

## 2024-02-15 NOTE — TOC Initial Note (Signed)
 Transition of Care [facility]) - Initial/Assessment Note    Patient Details  Name: [name] MRN: [id number] Date of Birth: [birth year]  Transition of Care [facility]) CM/SW Contact:    Lauraine JAYSON Carpen, LCSW Phone Number: 02/15/2024, 1:31 PM  Clinical Narrative:  CSW met with patient. No family at bedside. CSW introduced role and explained that discharge planning would be discussed. Patient confirmed she is a long-term resident at [facility] and plan is to return at discharge. Patient confirmed she does not have a HCPOA. CSW inquired if she would like to establish one while inpatient. She will think about it. No further concerns. CSW will continue to follow patient for support and facilitate return to SNF once medically stable.                Expected Discharge Plan: Skilled Nursing Facility Barriers to Discharge: Continued Medical Work up   Patient Goals and CMS Choice     Choice offered to / list presented to : Patient      Expected Discharge Plan and Services     Post Acute Care Choice: Resumption of Svcs/PTA Provider Living arrangements for the past 2 months: Skilled Nursing Facility                                      Prior Living Arrangements/Services Living arrangements for the past 2 months: Skilled Nursing Facility Lives with:: Facility Resident Patient language and need for interpreter reviewed:: Yes Do you feel safe going back to the place where you live?: Yes      Need for Family Participation in Patient Care: Yes (Comment) Care giver support system in place?: Yes (comment)   Criminal Activity/Legal Involvement Pertinent to Current Situation/Hospitalization: No - Comment as needed  Activities of Daily Living      Permission Sought/Granted Permission sought to share information with : Facility Industrial/product designer granted to share information with : Yes, Verbal Permission Granted     Permission granted to share info w AGENCY: 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) 10/02/2023   Altered mental status 09/29/2023   Acute congestive heart failure (HCC) 09/26/2023   Acute encephalopathy 09/24/2023   Multiple open wounds of lower leg, initial encounter 09/12/2023   Thrombocytosis 09/10/2023   Anemia of chronic disease 09/10/2023   Osteomyelitis of left fibula (HCC) 09/09/2023   Type II diabetes mellitus with renal manifestations (HCC) 09/04/2023   Chronic systolic CHF (congestive heart failure) (HCC) 09/04/2023   Obesity, Class III, BMI 40-49.9 (morbid obesity) 09/04/2023   DKA (diabetic ketoacidosis) (HCC) 10/30/2021   PVD (peripheral vascular disease) (HCC)    Vertigo    Sepsis without acute organ dysfunction (HCC)    Acute kidney injury superimposed on CKD (HCC)    Adjustment disorder with anxiety 04/21/2021   Sepsis (HCC) 04/17/2021  Pressure injury of skin 04/17/2021   Left leg pain 04/16/2021   Morbid obesity (HCC) 10/24/2017   Chronic kidney disease, stage IV (severe) (HCC) 10/24/2017   Hypothyroidism 11/15/2016   Edema 11/15/2016   Type 2 diabetes mellitus with hyperlipidemia (HCC) 11/15/2016   Essential hypertension 12/09/2015   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) 06/26/2015   Pressure ulcer 05/31/2015   Primary osteoarthritis of right hip  05/28/2015   PCP:  [employer]:   [facility] [address] ([address]), [place] - [address] ([address]) [place] [address] Phone: [phone] Fax: [phone]     Social Drivers of Health (SDOH) Social History: SDOH Screenings   Food Insecurity: No Food Insecurity (02/14/2024)  Housing: Low Risk  (02/14/2024)  Transportation Needs: No Transportation Needs (02/14/2024)  Utilities: Not At Risk (02/14/2024)  Alcohol Screen: Low Risk  (03/10/2021)  Depression (PHQ2-9): Low Risk  (03/10/2021)  Financial Resource Strain: Low Risk  (03/10/2021)  Physical Activity: Inactive (03/10/2021)  Social Connections: Unknown (02/14/2024)  Recent Concern: Social Connections - Socially Isolated (12/08/2023)  Stress: No Stress Concern Present (03/10/2021)  Tobacco Use: Low Risk  (02/13/2024)   SDOH Interventions:     Readmission Risk Interventions    11/03/2021   12:41 PM  Readmission Risk Prevention Plan  Transportation Screening Complete  PCP or Specialist Appt within 3-5 Days Complete  HRI or Home Care Consult Complete  Social Work Consult for Recovery Care Planning/Counseling Complete  Palliative Care Screening Complete  Medication Review Oceanographer) Complete

## 2024-02-16 DIAGNOSIS — N179 Acute kidney failure, unspecified: Secondary | ICD-10-CM | POA: Diagnosis not present

## 2024-02-16 LAB — BASIC METABOLIC PANEL WITH GFR
Anion gap: 9 (ref 5–15)
BUN: 27 mg/dL — ABNORMAL HIGH (ref 8–23)
CO2: 22 mmol/L (ref 22–32)
Calcium: 8.1 mg/dL — ABNORMAL LOW (ref 8.9–10.3)
Chloride: 105 mmol/L (ref 98–111)
Creatinine, Ser: 2.82 mg/dL — ABNORMAL HIGH (ref 0.44–1.00)
GFR, Estimated: 17 mL/min — ABNORMAL LOW (ref >60)
Glucose, Bld: 192 mg/dL — ABNORMAL HIGH (ref 70–99)
Potassium: 4 mmol/L (ref 3.5–5.1)
Sodium: 136 mmol/L (ref 135–145)

## 2024-02-16 LAB — GLUCOSE, CAPILLARY
Glucose-Capillary: 168 mg/dL — ABNORMAL HIGH (ref 70–99)
Glucose-Capillary: 248 mg/dL — ABNORMAL HIGH (ref 70–99)
Glucose-Capillary: 259 mg/dL — ABNORMAL HIGH (ref 70–99)

## 2024-02-16 LAB — BRAIN NATRIURETIC PEPTIDE: B Natriuretic Peptide: 128.9 pg/mL — ABNORMAL HIGH (ref 0.0–100.0)

## 2024-02-16 LAB — CBC
HCT: 28.7 % — ABNORMAL LOW (ref 36.0–46.0)
Hemoglobin: 8.3 g/dL — ABNORMAL LOW (ref 12.0–15.0)
MCH: 24.9 pg — ABNORMAL LOW (ref 26.0–34.0)
MCHC: 28.9 g/dL — ABNORMAL LOW (ref 30.0–36.0)
MCV: 86.2 fL (ref 80.0–100.0)
Platelets: 427 K/uL — ABNORMAL HIGH (ref 150–400)
RBC: 3.33 MIL/uL — ABNORMAL LOW (ref 3.87–5.11)
RDW: 15.8 % — ABNORMAL HIGH (ref 11.5–15.5)
WBC: 6.4 K/uL (ref 4.0–10.5)
nRBC: 0 % (ref 0.0–0.2)

## 2024-02-16 LAB — TSH: TSH: 0.11 u[IU]/mL — ABNORMAL LOW (ref 0.350–4.500)

## 2024-02-16 LAB — T4, FREE: Free T4: 0.87 ng/dL (ref 0.61–1.12)

## 2024-02-16 MED ORDER — FUROSEMIDE 20 MG PO TABS: 20.0000 mg | ORAL_TABLET | Freq: Every day | ORAL | 0 refills | Status: AC

## 2024-02-16 MED ORDER — LANTUS SOLOSTAR 100 UNIT/ML ~~LOC~~ SOPN: 12.0000 [IU] | PEN_INJECTOR | Freq: Two times a day (BID) | SUBCUTANEOUS | Status: AC

## 2024-02-16 NOTE — Plan of Care (Signed)
  Problem: Education: Goal: Ability to describe self-care measures that may prevent or decrease complications (Diabetes Survival Skills Education) will improve Outcome: Progressing   Problem: Coping: Goal: Ability to adjust to condition or change in health will improve Outcome: Progressing   Problem: Fluid Volume: Goal: Ability to maintain a balanced intake and output will improve Outcome: Progressing   Problem: Health Behavior/Discharge Planning: Goal: Ability to identify and utilize available resources and services will improve Outcome: Progressing Goal: Ability to manage health-related needs will improve Outcome: Progressing   Problem: Metabolic: Goal: Ability to maintain appropriate glucose levels will improve Outcome: Progressing   Problem: Nutritional: Goal: Maintenance of adequate nutrition will improve Outcome: Progressing Goal: Progress toward achieving an optimal weight will improve Outcome: Progressing   Problem: Skin Integrity: Goal: Risk for impaired skin integrity will decrease Outcome: Progressing   Problem: Tissue Perfusion: Goal: Adequacy of tissue perfusion will improve Outcome: Progressing   Problem: Education: Goal: Knowledge of General Education information will improve Description: Including pain rating scale, medication(s)/side effects and non-pharmacologic comfort measures Outcome: Progressing   Problem: Health Behavior/Discharge Planning: Goal: Ability to manage health-related needs will improve Outcome: Progressing   Problem: Clinical Measurements: Goal: Ability to maintain clinical measurements within normal limits will improve Outcome: Progressing Goal: Will remain free from infection Outcome: Progressing Goal: Diagnostic test results will improve Outcome: Progressing Goal: Respiratory complications will improve Outcome: Progressing Goal: Cardiovascular complication will be avoided Outcome: Progressing   Problem: Activity: Goal:  Risk for activity intolerance will decrease Outcome: Progressing   Problem: Nutrition: Goal: Adequate nutrition will be maintained Outcome: Progressing   Problem: Coping: Goal: Level of anxiety will decrease Outcome: Progressing   Problem: Elimination: Goal: Will not experience complications related to bowel motility Outcome: Progressing Goal: Will not experience complications related to urinary retention Outcome: Progressing   Problem: Pain Managment: Goal: General experience of comfort will improve and/or be controlled Outcome: Progressing   Problem: Skin Integrity: Goal: Risk for impaired skin integrity will decrease Outcome: Progressing

## 2024-02-16 NOTE — Discharge Summary (Signed)
 Physician Discharge Summary   Patient: [name] MRN: [id number] DOB: 1956/05/08  Admit date:     02/13/2024  Discharge date: 02/16/24  Discharge Physician: Alban Pepper   PCP: [facility]   Recommendations at discharge:    Repeat BMP in 1 week to ensure renal function continues to normalize  Her insulin  medications will need to be adjusted she was on half dose in the hospital, will discharge on 12u of long acting twice a day (previously on 14 u twice a day) Repeat vitamin D level  Repeat TFTs, levothryoxine dose may need to be adjusted, TSH decreased this hospitalization.  Consider adding SGLTi Discuss with cardiology if eliquis  needs to be contiued.   Discharge Diagnoses: Principal Problem:   AKI (acute kidney injury) (HCC)  Resolved Problems:   * No resolved hospital problems. *  Hospital Course:  [name] is a 68 y.o. female with medical history significant of diabetes mellitus, CHF, hypertension, hypothyroidism, CKD who was brought in from her facility on account of elevated renal function.  At the time patient was being seen she denied nausea vomiting abdominal pain chest pain or cough. ED course: Upon arrival to the emergency room patient's temperature 98.4 respiratory rate 21 pulse 110 BP 155/85 Laboratory results showed creatinine of 3.5 with baseline creatinine of 1.5.  Patient looks clinically dehydrated in the setting of Lasix  use and as such initiated on IV fluid and [facility] was contacted to admit patient for further management.   CT a/p showed no hydronephrosis and bladder was decompressed.   She was encouraged to eat and drink and her lasix  was held. He rrenal function improved.    Assessment and Plan: AKI on CKD 3b   Unclear etiology.  Imaging of her abdomen and pelvis showed no evidence of hydronephrosis. Improved with IV fluids and holding diuretic. Possibly due to overdiuresis.     Latest Ref Rng & Units 02/16/2024    5:42 AM  02/15/2024    4:47 AM 02/14/2024    5:16 AM  BMP  Glucose 70 - 99 mg/dL 807  767  877   BUN 8 - 23 mg/dL 27  30  29    Creatinine 0.44 - 1.00 mg/dL 7.17  6.76  6.67   Sodium 135 - 145 mmol/L 136  140  140   Potassium 3.5 - 5.1 mmol/L 4.0  4.3  4.5   Chloride 98 - 111 mmol/L 105  107  106   CO2 22 - 32 mmol/L 22  21  23    Calcium  8.9 - 10.3 mg/dL 8.1  8.6  8.2    Will discharge patient on lasix  20mg  daily. Discussed with patient that she should consider initiating SGLTi outside of the hospital.     HFimEF Echo in April 2025 showed EF 60 to 65% with grade 1 diastolic dysfunction Lasix  20mg  daily resumed. She will need follow up with her pcp to have this medicine adjusted.  Consider adding SGLTi in outpatient setting.    HTN Continue metoprolol     Normocytic anemia Multifactorial AOCKD and iron  deficiency  Iron /TIBC/Ferritin/ %Sat Labs (Brief)          Component Value Date/Time    IRON  18 (L) 02/15/2024 0447    TIBC 260 02/15/2024 0447    FERRITIN 25 02/15/2024 0447    IRONPCTSAT 7 (L) 02/15/2024 0447      Continue iron  supplementation    Hypothyroidism Continue home levothyroxine . TSH decreased FT4 WNL. Will need to see PCP  regarding adjusting levothyroxine  dose.    Type 2 diabetes complicated by neuropathy Insulin  therapy and gabapentin . Consider adding SGLTI in outpatient setting.    PAF In sinus.  On metoprolol  and eliquis .  High bleeding risk. But appears to be on eliquis . Eliquis  was resumed on discharge.      PAD On plavix .    Obesity, Class III, BMI 40-49.9 (morbid obesity) This complicates overall care and prognosis.         Consultants: None Procedures performed: NOne  Disposition: Nursing home Diet recommendation:  Carb modified diet DISCHARGE MEDICATION: Allergies as of 02/16/2024       Reactions   Cefepime  Other (See Comments)   Encephalopathy -- precipitated by AKI and opioids        Medication List     STOP taking these medications     Cholecalciferol  1.25 MG (50000 UT) capsule   doxycycline  100 MG capsule Commonly known as: VIBRAMYCIN    senna-docusate 8.6-50 MG tablet Commonly known as: Senokot-S       TAKE these medications    acetaminophen  325 MG tablet Commonly known as: TYLENOL  Take 2 tablets (650 mg total) by mouth every 6 (six) hours as needed for mild pain (or Fever >/= 101).   amoxicillin-clavulanate 500-125 MG tablet Commonly known as: AUGMENTIN Take 1 tablet by mouth 2 (two) times daily.   apixaban  5 MG Tabs tablet Commonly known as: ELIQUIS  Take 1 tablet (5 mg total) by mouth 2 (two) times daily.   ascorbic acid  500 MG tablet Commonly known as: VITAMIN C  Take 1 tablet (500 mg total) by mouth daily.   bisacodyl  5 MG EC tablet Commonly known as: DULCOLAX Take 1 tablet (5 mg total) by mouth daily as needed for moderate constipation.   blood glucose meter kit and supplies 1 each by Other route daily. ONE TOUCH ULTRA METER. Use as directed. E11.29   ciprofloxacin 250 MG tablet Commonly known as: CIPRO Take 250 mg by mouth 2 (two) times daily.   clopidogrel  75 MG tablet Commonly known as: PLAVIX  Take 1 tablet (75 mg total) by mouth daily with breakfast.   diclofenac  Sodium 1 % Gel Commonly known as: VOLTAREN  Apply 2 g topically 3 (three) times daily.   ferrous gluconate  324 MG tablet Commonly known as: FERGON Take 324 mg by mouth daily with breakfast.   fluconazole  200 MG tablet Commonly known as: DIFLUCAN  Take 200 mg by mouth daily.   fluticasone  50 MCG/ACT nasal spray Commonly known as: FLONASE  Place 1 spray into both nostrils daily.   furosemide  20 MG tablet Commonly known as: LASIX  Take 1 tablet (20 mg total) by mouth daily. What changed: when to take this   gabapentin  300 MG capsule Commonly known as: NEURONTIN  Take 1 capsule (300 mg total) by mouth at bedtime.   gentamicin  ointment 0.1 % Commonly known as: GARAMYCIN  Apply 1 Application topically daily.    glucose blood test strip 1 each by Other route daily. ONE TOUCH ULTRA TEST STRIPS E11.29   hydrOXYzine  10 MG tablet Commonly known as: ATARAX  Take 10 mg by mouth every 8 (eight) hours as needed for itching.   insulin  aspart 100 UNIT/ML injection Commonly known as: novoLOG  Inject 3 Units into the skin 3 (three) times daily with meals. What changed: how much to take   Lantus  SoloStar 100 UNIT/ML Solostar Pen Generic drug: insulin  glargine Inject 12 Units into the skin every 12 (twelve) hours. What changed: how much to take   Levothyroxine  Sodium 150 MCG Caps  Take 150 mcg by mouth daily before breakfast.   metoprolol  succinate 25 MG 24 hr tablet Commonly known as: TOPROL -XL Take 0.5 tablets (12.5 mg total) by mouth daily with breakfast.   multivitamin with minerals Tabs tablet Take 1 tablet by mouth daily.   nystatin  powder Generic drug: nystatin  Apply 1 application. topically 3 (three) times daily.   ondansetron  4 MG disintegrating tablet Commonly known as: ZOFRAN -ODT Take 4 mg by mouth every 6 (six) hours as needed for nausea or vomiting.   onetouch ultrasoft lancets 1 each by Other route daily. Use as instructed E11.29   oxyCODONE  5 MG immediate release tablet Commonly known as: Oxy IR/ROXICODONE  Take 5 mg by mouth every 6 (six) hours as needed for moderate pain (pain score 4-6).   Polyvinyl Alcohol-Povidone PF 1.4-0.6 % Soln Apply 1 drop to eye daily.   PROBIOTIC BLEND PO Take 1 capsule by mouth 2 (two) times daily.   senna 8.6 MG Tabs tablet Commonly known as: SENOKOT Take 2 tablets by mouth at bedtime as needed for mild constipation.   Vitamin D (Ergocalciferol) 1.25 MG (50000 UNIT) Caps capsule Commonly known as: DRISDOL Take 50,000 Units by mouth every 7 (seven) days. Tuesday   zinc  oxide 20 % ointment Apply 1 Application topically as needed for diaper changes.        Contact information for follow-up providers     [facility]  Follow up in 1 week(s).   Specialty: Geriatric Medicine Contact information: [address] [place] [address] [phone]              Contact information for after-discharge care     Destination     [facility] .   Service: Skilled Nursing Contact information: [address] [place]  [phone]                    Discharge Exam: Filed Weights   02/13/24 0944 02/15/24 0500 02/16/24 0500  Weight: 117.9 kg 118.3 kg 120.5 kg   Physical Exam  Constitutional: In no distress.  Cardiovascular: Normal rate, regular rhythm.  Pulmonary: Non labored breathing on room air, no wheezing or rales.  Abdominal: Soft. Normal bowel sounds. Non distended and non tender Musculoskeletal: Normal range of motion.     Neurological: Alert and oriented to person, place, and time. Non focal  Skin: Skin is warm and dry. BLE wrapped. Bilateral feet with gold crusting.   Condition at discharge: improving  The results of significant diagnostics from this hospitalization (including imaging, microbiology, ancillary and laboratory) are listed below for reference.   Imaging Studies: CT ABDOMEN PELVIS WO CONTRAST Result Date: 02/13/2024 CLINICAL DATA:  RLQ abdominal pain right sided abd pain, AKI EXAM: CT ABDOMEN AND PELVIS WITHOUT CONTRAST TECHNIQUE: Multidetector CT imaging of the abdomen and pelvis was performed following the standard protocol without IV contrast. RADIATION DOSE REDUCTION: This exam was performed according to the departmental dose-optimization program which includes automated exposure control, adjustment of the mA and/or kV according to patient size and/or use of iterative reconstruction technique. COMPARISON:  September 27, 2023 FINDINGS: Of note, the lack of intravenous contrast limits evaluation of the solid organ parenchyma and vascularity. Lower chest: No focal airspace consolidation or pleural effusion. Subsegmental  atelectasis in the right middle and right lower lobes. Multi-vessel coronary atherosclerosis. Hepatobiliary: No mass.Decompressed gallbladder with multiple radiopaque stones. No wall thickening. No intrahepatic or extrahepatic biliary ductal dilation. Pancreas: No mass or main ductal  dilation. No peripancreatic inflammation or fluid collection. Spleen: Normal size. No mass. Adrenals/Urinary Tract: No adrenal masses. Renal cortical atrophy with a couple of small cysts in the right kidney, unchanged. No hydronephrosis or nephrolithiasis. The urinary bladder is decompressed and obscured by metallic streak artifact from orthopedic hardware. No focal abnormality visualized. Stomach/Bowel: The stomach is decompressed without focal abnormality. No small bowel wall thickening or inflammation. No small bowel obstruction.Normal appendix. Vascular/Lymphatic: No aortic aneurysm. Diffuse aortoiliac atherosclerosis. No intraabdominal or pelvic lymphadenopathy. Nonenlarged, peripherally calcified lymph nodes in the retroperitoneum and pelvis, which may be due to chronic kidney disease, prior granulomatous infection, or even remote, treated lymphoma. Reproductive: Age-related atrophy of the uterus and ovaries. No concerning adnexal mass. No free pelvic fluid. Other: No pneumoperitoneum, ascites, or mesenteric inflammation. Musculoskeletal: No acute fracture or destructive lesion. Right hip arthroplasty is anatomically aligned without dislocation. Multilevel degenerative disc disease of the spine. Diffuse osteopenia. Moderate left hip osteoarthritis. IMPRESSION: 1. No acute intra-abdominal or pelvic abnormality.  Normal appendix. 2. Cholecystolithiasis.  No changes of acute cholecystitis. Aortic Atherosclerosis (ICD10-I70.0). Electronically Signed   By: Rogelia Myers M.D.   On: 02/13/2024 10:43    Microbiology: Results for orders placed or performed during the hospital encounter of 02/13/24  MRSA Next Gen by PCR, Nasal      Status: None   Collection Time: 02/15/24  5:59 PM   Specimen: Nasal Mucosa; Nasal Swab  Result Value Ref Range Status   MRSA by PCR Next Gen NOT DETECTED NOT DETECTED Final    Comment: (NOTE) The GeneXpert MRSA Assay (FDA approved for NASAL specimens only), is one component of a comprehensive MRSA colonization surveillance program. It is not intended to diagnose MRSA infection nor to guide or monitor treatment for MRSA infections. Test performance is not FDA approved in patients less than 20 years old. Performed at [facility], [address] [place], [place] [address]     Labs: CBC: Recent Labs  Lab 02/13/24 [phone] 02/14/24 0516 02/14/24 1630 02/15/24 0813 02/16/24 0542  WBC 7.6 6.7 6.5 7.5 6.4  HGB 9.5* 7.7* 8.9* 8.5* 8.3*  HCT 31.4* 26.1* 29.0* 28.3* 28.7*  MCV 83.7 85.3 83.6 84.0 86.2  PLT 534* 419* 451* 469* 427*   Basic Metabolic Panel: Recent Labs  Lab 02/13/24 0950 02/14/24 0516 02/15/24 0447 02/16/24 0542  NA 140 140 140 136  K 4.9 4.5 4.3 4.0  CL 103 106 107 105  CO2 26 23 21* 22  GLUCOSE 177* 122* 232* 192*  BUN 29* 29* 30* 27*  CREATININE 3.54* 3.32* 3.23* 2.82*  CALCIUM  8.7* 8.2* 8.6* 8.1*  PHOS  --   --  4.2  --    Liver Function Tests: Recent Labs  Lab 02/13/24 0950  AST 29  ALT 10  ALKPHOS 65  BILITOT 0.4  PROT 7.4  ALBUMIN 2.4*   CBG: Recent Labs  Lab 02/15/24 1158 02/15/24 1614 02/15/24 2129 02/16/24 0806 02/16/24 1147  GLUCAP 262* 230* 258* 168* 248*    Discharge time spent: greater than 30 minutes.  Signed: Alban Pepper, MD [facility] 02/16/2024

## 2024-02-16 NOTE — Progress Notes (Signed)
 PT Cancellation Note  Patient Details Name: [name] MRN: [id number] DOB: November 27, 1955   Cancelled Treatment:    Reason Eval/Treat Not Completed: PT screened, no needs identified, will sign off.  Duplicate order received. Pt evaluated by PT this admission and signed off as pt is at baseline for ADLs and mobility. Pt is LTC resident, bed bound at baseline, dependent hoyer for all mobility needs, does not demonstrate ability to make functional gains, will sign off. Please see PT evaluation 02/14/24 for reference.   CHARM [name] PT, DPT 02/16/24, 11:57 AM

## 2024-02-16 NOTE — TOC Transition Note (Signed)
 Transition of Care [facility]) - Discharge Note   Patient Details  Name: [name] MRN: [id number] Date of Birth: [birth year]  [facility] CM/SW Contact:  Racheal LITTIE Schimke, RN Phone Number: 02/16/2024, 2:36 PM   Clinical Narrative: Discharge back to [facility], room 104-B.     Final next level of care: Long Term Nursing Home Barriers to Discharge: Barriers Resolved   Patient Goals and CMS Choice Patient states their goals for this hospitalization and ongoing recovery are:: [facility]   Choice offered to / list presented to : Patient      Discharge Placement                Patient to be transferred to facility by: [facility]   Patient and family notified of of transfer: 02/16/24  Discharge Plan and Services Additional resources added to the After Visit Summary for       Post Acute Care Choice: Resumption of Svcs/PTA Provider          DME Arranged: N/A DME Agency: NA       HH Arranged: NA HH Agency: NA        Social Drivers of Health (SDOH) Interventions SDOH Screenings   Food Insecurity: No Food Insecurity (02/14/2024)  Housing: Low Risk  (02/14/2024)  Transportation Needs: No Transportation Needs (02/14/2024)  Utilities: Not At Risk (02/14/2024)  Alcohol Screen: Low Risk  (03/10/2021)  Depression (PHQ2-9): Low Risk  (03/10/2021)  Financial Resource Strain: Low Risk  (03/10/2021)  Physical Activity: Inactive (03/10/2021)  Social Connections: Unknown (02/14/2024)  Recent Concern: Social Connections - Socially Isolated (12/08/2023)  Stress: No Stress Concern Present (03/10/2021)  Tobacco Use: Low Risk  (02/13/2024)     Readmission Risk Interventions    11/03/2021   12:41 PM  Readmission Risk Prevention Plan  Transportation Screening Complete  PCP or Specialist Appt within 3-5 Days Complete  HRI or Home Care Consult Complete  Social Work Consult for Recovery Care Planning/Counseling Complete  Palliative Care Screening Complete  Medication Review Special educational needs teacher) Complete

## 2024-02-19 DIAGNOSIS — N184 Chronic kidney disease, stage 4 (severe): Secondary | ICD-10-CM | POA: Diagnosis not present

## 2024-02-19 DIAGNOSIS — M79606 Pain in leg, unspecified: Secondary | ICD-10-CM | POA: Diagnosis not present

## 2024-02-19 DIAGNOSIS — N179 Acute kidney failure, unspecified: Secondary | ICD-10-CM | POA: Diagnosis not present

## 2024-02-21 DIAGNOSIS — N179 Acute kidney failure, unspecified: Secondary | ICD-10-CM | POA: Diagnosis not present

## 2024-02-21 DIAGNOSIS — Z794 Long term (current) use of insulin: Secondary | ICD-10-CM | POA: Diagnosis not present

## 2024-02-21 DIAGNOSIS — N184 Chronic kidney disease, stage 4 (severe): Secondary | ICD-10-CM | POA: Diagnosis not present

## 2024-02-21 DIAGNOSIS — E559 Vitamin D deficiency, unspecified: Secondary | ICD-10-CM | POA: Diagnosis not present

## 2024-02-21 DIAGNOSIS — E039 Hypothyroidism, unspecified: Secondary | ICD-10-CM | POA: Diagnosis not present

## 2024-02-21 DIAGNOSIS — G8929 Other chronic pain: Secondary | ICD-10-CM | POA: Diagnosis not present

## 2024-02-21 DIAGNOSIS — D509 Iron deficiency anemia, unspecified: Secondary | ICD-10-CM | POA: Diagnosis not present

## 2024-02-21 DIAGNOSIS — E11628 Type 2 diabetes mellitus with other skin complications: Secondary | ICD-10-CM | POA: Diagnosis not present

## 2024-03-01 DIAGNOSIS — L299 Pruritus, unspecified: Secondary | ICD-10-CM | POA: Diagnosis not present

## 2024-03-07 DIAGNOSIS — L602 Onychogryphosis: Secondary | ICD-10-CM | POA: Diagnosis not present

## 2024-03-07 DIAGNOSIS — E1151 Type 2 diabetes mellitus with diabetic peripheral angiopathy without gangrene: Secondary | ICD-10-CM | POA: Diagnosis not present

## 2024-03-07 DIAGNOSIS — Z794 Long term (current) use of insulin: Secondary | ICD-10-CM | POA: Diagnosis not present

## 2024-03-11 DIAGNOSIS — G8929 Other chronic pain: Secondary | ICD-10-CM | POA: Diagnosis not present

## 2024-03-13 DIAGNOSIS — F330 Major depressive disorder, recurrent, mild: Secondary | ICD-10-CM | POA: Diagnosis not present

## 2024-03-15 DIAGNOSIS — L03116 Cellulitis of left lower limb: Secondary | ICD-10-CM | POA: Diagnosis not present

## 2024-03-18 DIAGNOSIS — E119 Type 2 diabetes mellitus without complications: Secondary | ICD-10-CM | POA: Diagnosis not present

## 2024-03-18 DIAGNOSIS — H25813 Combined forms of age-related cataract, bilateral: Secondary | ICD-10-CM | POA: Diagnosis not present

## 2024-03-21 DIAGNOSIS — Z794 Long term (current) use of insulin: Secondary | ICD-10-CM | POA: Diagnosis not present

## 2024-03-21 DIAGNOSIS — D509 Iron deficiency anemia, unspecified: Secondary | ICD-10-CM | POA: Diagnosis not present

## 2024-03-21 DIAGNOSIS — L03116 Cellulitis of left lower limb: Secondary | ICD-10-CM | POA: Diagnosis not present

## 2024-03-21 DIAGNOSIS — G8929 Other chronic pain: Secondary | ICD-10-CM | POA: Diagnosis not present

## 2024-03-21 DIAGNOSIS — E039 Hypothyroidism, unspecified: Secondary | ICD-10-CM | POA: Diagnosis not present

## 2024-03-21 DIAGNOSIS — N184 Chronic kidney disease, stage 4 (severe): Secondary | ICD-10-CM | POA: Diagnosis not present

## 2024-03-21 DIAGNOSIS — I890 Lymphedema, not elsewhere classified: Secondary | ICD-10-CM | POA: Diagnosis not present

## 2024-03-21 DIAGNOSIS — L299 Pruritus, unspecified: Secondary | ICD-10-CM | POA: Diagnosis not present

## 2024-03-25 DIAGNOSIS — F411 Generalized anxiety disorder: Secondary | ICD-10-CM | POA: Diagnosis not present

## 2024-03-27 DIAGNOSIS — E559 Vitamin D deficiency, unspecified: Secondary | ICD-10-CM | POA: Diagnosis not present

## 2024-03-27 DIAGNOSIS — I130 Hypertensive heart and chronic kidney disease with heart failure and stage 1 through stage 4 chronic kidney disease, or unspecified chronic kidney disease: Secondary | ICD-10-CM | POA: Diagnosis not present

## 2024-03-27 DIAGNOSIS — R682 Dry mouth, unspecified: Secondary | ICD-10-CM | POA: Diagnosis not present

## 2024-03-27 DIAGNOSIS — R52 Pain, unspecified: Secondary | ICD-10-CM | POA: Diagnosis not present

## 2024-03-27 DIAGNOSIS — I890 Lymphedema, not elsewhere classified: Secondary | ICD-10-CM | POA: Diagnosis not present

## 2024-03-27 DIAGNOSIS — E1169 Type 2 diabetes mellitus with other specified complication: Secondary | ICD-10-CM | POA: Diagnosis not present

## 2024-03-27 DIAGNOSIS — K5900 Constipation, unspecified: Secondary | ICD-10-CM | POA: Diagnosis not present

## 2024-03-28 DIAGNOSIS — E039 Hypothyroidism, unspecified: Secondary | ICD-10-CM | POA: Diagnosis not present

## 2024-03-28 DIAGNOSIS — E559 Vitamin D deficiency, unspecified: Secondary | ICD-10-CM | POA: Diagnosis not present

## 2024-03-28 DIAGNOSIS — N184 Chronic kidney disease, stage 4 (severe): Secondary | ICD-10-CM | POA: Diagnosis not present

## 2024-04-02 DIAGNOSIS — G8929 Other chronic pain: Secondary | ICD-10-CM | POA: Diagnosis not present

## 2024-04-04 DIAGNOSIS — B999 Unspecified infectious disease: Secondary | ICD-10-CM | POA: Diagnosis not present

## 2024-04-04 DIAGNOSIS — T07XXXA Unspecified multiple injuries, initial encounter: Secondary | ICD-10-CM | POA: Diagnosis not present

## 2024-04-08 DIAGNOSIS — G8929 Other chronic pain: Secondary | ICD-10-CM | POA: Diagnosis not present

## 2024-04-15 DIAGNOSIS — L97212 Non-pressure chronic ulcer of right calf with fat layer exposed: Secondary | ICD-10-CM | POA: Diagnosis not present

## 2024-04-15 DIAGNOSIS — L97222 Non-pressure chronic ulcer of left calf with fat layer exposed: Secondary | ICD-10-CM | POA: Diagnosis not present

## 2024-04-17 DIAGNOSIS — N184 Chronic kidney disease, stage 4 (severe): Secondary | ICD-10-CM | POA: Diagnosis not present

## 2024-04-17 DIAGNOSIS — D509 Iron deficiency anemia, unspecified: Secondary | ICD-10-CM | POA: Diagnosis not present

## 2024-04-17 DIAGNOSIS — E039 Hypothyroidism, unspecified: Secondary | ICD-10-CM | POA: Diagnosis not present

## 2024-04-17 DIAGNOSIS — T07XXXA Unspecified multiple injuries, initial encounter: Secondary | ICD-10-CM | POA: Diagnosis not present

## 2024-04-17 DIAGNOSIS — I10 Essential (primary) hypertension: Secondary | ICD-10-CM | POA: Diagnosis not present

## 2024-04-17 DIAGNOSIS — E559 Vitamin D deficiency, unspecified: Secondary | ICD-10-CM | POA: Diagnosis not present

## 2024-04-17 DIAGNOSIS — G8929 Other chronic pain: Secondary | ICD-10-CM | POA: Diagnosis not present

## 2024-04-17 DIAGNOSIS — I890 Lymphedema, not elsewhere classified: Secondary | ICD-10-CM | POA: Diagnosis not present

## 2024-04-19 DIAGNOSIS — Z91199 Patient's noncompliance with other medical treatment and regimen due to unspecified reason: Secondary | ICD-10-CM | POA: Diagnosis not present

## 2024-04-19 DIAGNOSIS — G8929 Other chronic pain: Secondary | ICD-10-CM | POA: Diagnosis not present

## 2024-04-19 DIAGNOSIS — M79606 Pain in leg, unspecified: Secondary | ICD-10-CM | POA: Diagnosis not present

## 2024-04-20 DIAGNOSIS — M1612 Unilateral primary osteoarthritis, left hip: Secondary | ICD-10-CM | POA: Diagnosis not present

## 2024-04-22 DIAGNOSIS — G8929 Other chronic pain: Secondary | ICD-10-CM | POA: Diagnosis not present

## 2024-04-22 DIAGNOSIS — M79606 Pain in leg, unspecified: Secondary | ICD-10-CM | POA: Diagnosis not present

## 2024-04-22 DIAGNOSIS — Z0389 Encounter for observation for other suspected diseases and conditions ruled out: Secondary | ICD-10-CM | POA: Diagnosis not present

## 2024-04-22 DIAGNOSIS — R509 Fever, unspecified: Secondary | ICD-10-CM | POA: Diagnosis not present

## 2024-04-22 DIAGNOSIS — Z91199 Patient's noncompliance with other medical treatment and regimen due to unspecified reason: Secondary | ICD-10-CM | POA: Diagnosis not present

## 2024-04-23 ENCOUNTER — Observation Stay
Admission: EM | Admit: 2024-04-23 | Discharge: 2024-04-24 | Disposition: A | Discharge status: Home / Self Care | Source: Ambulatory Visit | Attending: Family Medicine | Admitting: Family Medicine

## 2024-04-23 ENCOUNTER — Other Ambulatory Visit: Payer: Self-pay

## 2024-04-23 DIAGNOSIS — Z794 Long term (current) use of insulin: Secondary | ICD-10-CM | POA: Insufficient documentation

## 2024-04-23 DIAGNOSIS — L97929 Non-pressure chronic ulcer of unspecified part of left lower leg with unspecified severity: Secondary | ICD-10-CM | POA: Diagnosis not present

## 2024-04-23 DIAGNOSIS — E1122 Type 2 diabetes mellitus with diabetic chronic kidney disease: Secondary | ICD-10-CM | POA: Diagnosis not present

## 2024-04-23 DIAGNOSIS — I129 Hypertensive chronic kidney disease with stage 1 through stage 4 chronic kidney disease, or unspecified chronic kidney disease: Secondary | ICD-10-CM | POA: Diagnosis not present

## 2024-04-23 DIAGNOSIS — N1832 Chronic kidney disease, stage 3b: Secondary | ICD-10-CM | POA: Diagnosis not present

## 2024-04-23 DIAGNOSIS — D638 Anemia in other chronic diseases classified elsewhere: Secondary | ICD-10-CM

## 2024-04-23 DIAGNOSIS — N184 Chronic kidney disease, stage 4 (severe): Secondary | ICD-10-CM

## 2024-04-23 DIAGNOSIS — I739 Peripheral vascular disease, unspecified: Secondary | ICD-10-CM | POA: Diagnosis present

## 2024-04-23 DIAGNOSIS — R531 Weakness: Secondary | ICD-10-CM | POA: Diagnosis not present

## 2024-04-23 DIAGNOSIS — I10 Essential (primary) hypertension: Secondary | ICD-10-CM | POA: Diagnosis present

## 2024-04-23 DIAGNOSIS — E039 Hypothyroidism, unspecified: Secondary | ICD-10-CM | POA: Diagnosis not present

## 2024-04-23 DIAGNOSIS — E1169 Type 2 diabetes mellitus with other specified complication: Secondary | ICD-10-CM | POA: Diagnosis present

## 2024-04-23 DIAGNOSIS — L97919 Non-pressure chronic ulcer of unspecified part of right lower leg with unspecified severity: Secondary | ICD-10-CM | POA: Diagnosis not present

## 2024-04-23 DIAGNOSIS — D631 Anemia in chronic kidney disease: Secondary | ICD-10-CM | POA: Diagnosis not present

## 2024-04-23 DIAGNOSIS — E785 Hyperlipidemia, unspecified: Secondary | ICD-10-CM | POA: Insufficient documentation

## 2024-04-23 DIAGNOSIS — D649 Anemia, unspecified: Principal | ICD-10-CM | POA: Diagnosis present

## 2024-04-23 DIAGNOSIS — Z743 Need for continuous supervision: Secondary | ICD-10-CM | POA: Diagnosis not present

## 2024-04-23 DIAGNOSIS — I87313 Chronic venous hypertension (idiopathic) with ulcer of bilateral lower extremity: Secondary | ICD-10-CM | POA: Diagnosis not present

## 2024-04-23 DIAGNOSIS — Z79899 Other long term (current) drug therapy: Secondary | ICD-10-CM | POA: Diagnosis not present

## 2024-04-23 DIAGNOSIS — T07XXXA Unspecified multiple injuries, initial encounter: Secondary | ICD-10-CM | POA: Diagnosis not present

## 2024-04-23 DIAGNOSIS — M79606 Pain in leg, unspecified: Secondary | ICD-10-CM | POA: Diagnosis not present

## 2024-04-23 LAB — CBC WITH DIFFERENTIAL/PLATELET
Abs Immature Granulocytes: 0.04 K/uL (ref 0.00–0.07)
Basophils Absolute: 0.1 K/uL (ref 0.0–0.1)
Basophils Relative: 1 %
Eosinophils Absolute: 0.7 K/uL — ABNORMAL HIGH (ref 0.0–0.5)
Eosinophils Relative: 9 %
HCT: 25.2 % — ABNORMAL LOW (ref 36.0–46.0)
Hemoglobin: 7.1 g/dL — ABNORMAL LOW (ref 12.0–15.0)
Immature Granulocytes: 1 %
Lymphocytes Relative: 17 %
Lymphs Abs: 1.3 K/uL (ref 0.7–4.0)
MCH: 24.2 pg — ABNORMAL LOW (ref 26.0–34.0)
MCHC: 28.2 g/dL — ABNORMAL LOW (ref 30.0–36.0)
MCV: 86 fL (ref 80.0–100.0)
Monocytes Absolute: 0.6 K/uL (ref 0.1–1.0)
Monocytes Relative: 8 %
Neutro Abs: 5 K/uL (ref 1.7–7.7)
Neutrophils Relative %: 64 %
Platelets: 471 K/uL — ABNORMAL HIGH (ref 150–400)
RBC: 2.93 MIL/uL — ABNORMAL LOW (ref 3.87–5.11)
RDW: 20.2 % — ABNORMAL HIGH (ref 11.5–15.5)
WBC: 7.6 K/uL (ref 4.0–10.5)
nRBC: 0 % (ref 0.0–0.2)

## 2024-04-23 LAB — HEMOGLOBIN AND HEMATOCRIT, BLOOD
HCT: 28 % — ABNORMAL LOW (ref 36.0–46.0)
Hemoglobin: 8.1 g/dL — ABNORMAL LOW (ref 12.0–15.0)

## 2024-04-23 LAB — BASIC METABOLIC PANEL WITH GFR
Anion gap: 7 (ref 5–15)
BUN: 23 mg/dL (ref 8–23)
CO2: 22 mmol/L (ref 22–32)
Calcium: 8.3 mg/dL — ABNORMAL LOW (ref 8.9–10.3)
Chloride: 110 mmol/L (ref 98–111)
Creatinine, Ser: 1.79 mg/dL — ABNORMAL HIGH (ref 0.44–1.00)
GFR, Estimated: 30 mL/min — ABNORMAL LOW (ref >60)
Glucose, Bld: 150 mg/dL — ABNORMAL HIGH (ref 70–99)
Potassium: 5.1 mmol/L (ref 3.5–5.1)
Sodium: 139 mmol/L (ref 135–145)

## 2024-04-23 LAB — RETIC PANEL
Immature Retic Fract: 31.5 % — ABNORMAL HIGH (ref 2.3–15.9)
RBC.: 2.87 MIL/uL — ABNORMAL LOW (ref 3.87–5.11)
Retic Count, Absolute: 104.8 K/uL (ref 19.0–186.0)
Retic Ct Pct: 3.7 % — ABNORMAL HIGH (ref 0.4–3.1)
Reticulocyte Hemoglobin: 18.8 pg — ABNORMAL LOW (ref >27.9)

## 2024-04-23 LAB — FERRITIN: Ferritin: 48 ng/mL (ref 11–307)

## 2024-04-23 LAB — VITAMIN B12: Vitamin B-12: 236 pg/mL (ref 180–914)

## 2024-04-23 LAB — HEMOGLOBIN A1C
Hgb A1c MFr Bld: 5.6 % (ref 4.8–5.6)
Mean Plasma Glucose: 114.02 mg/dL

## 2024-04-23 LAB — IRON AND TIBC
Iron: 41 ug/dL (ref 28–170)
Saturation Ratios: 15 % (ref 10.4–31.8)
TIBC: 279 ug/dL (ref 250–450)
UIBC: 238 ug/dL

## 2024-04-23 LAB — PREPARE RBC (CROSSMATCH)

## 2024-04-23 LAB — GLUCOSE, CAPILLARY: Glucose-Capillary: 167 mg/dL — ABNORMAL HIGH (ref 70–99)

## 2024-04-23 LAB — MAGNESIUM: Magnesium: 2.1 mg/dL (ref 1.7–2.4)

## 2024-04-23 LAB — FOLATE: Folate: 6.5 ng/mL (ref >5.9)

## 2024-04-23 MED ORDER — ACETAMINOPHEN 650 MG RE SUPP: 650.0000 mg | Freq: Four times a day (QID) | RECTAL | Status: DC | PRN

## 2024-04-23 MED ORDER — GABAPENTIN 300 MG PO CAPS: 300.0000 mg | ORAL_CAPSULE | Freq: Every day | ORAL | Status: DC

## 2024-04-23 MED ORDER — COLLAGENASE 250 UNIT/GM EX OINT
TOPICAL_OINTMENT | Freq: Every day | CUTANEOUS | Status: DC
  Filled 2024-04-23 (×2): qty 30

## 2024-04-23 MED ORDER — ACETAMINOPHEN 325 MG PO TABS
650.0000 mg | ORAL_TABLET | Freq: Four times a day (QID) | ORAL | Status: DC | PRN
  Administered 2024-04-23: 650 mg via ORAL
  Filled 2024-04-23: qty 2

## 2024-04-23 MED ORDER — OXYCODONE HCL 5 MG PO TABS
5.0000 mg | ORAL_TABLET | ORAL | Status: DC | PRN
  Administered 2024-04-24 (×2): 5 mg via ORAL
  Filled 2024-04-23 (×2): qty 1

## 2024-04-23 MED ORDER — SODIUM CHLORIDE 0.9 % IV SOLN: 10.0000 mL/h | Freq: Once | INTRAVENOUS | Status: DC

## 2024-04-23 MED ORDER — IRON SUCROSE 200 MG IVPB - SIMPLE MED
200.0000 mg | Freq: Once | Status: AC
  Administered 2024-04-23: 200 mg via INTRAVENOUS
  Filled 2024-04-23: qty 200

## 2024-04-23 MED ORDER — SODIUM CHLORIDE 0.9% FLUSH
3.0000 mL | Freq: Two times a day (BID) | INTRAVENOUS | Status: DC
  Administered 2024-04-23 – 2024-04-24 (×2): 3 mL via INTRAVENOUS

## 2024-04-23 MED ORDER — LEVOTHYROXINE SODIUM 150 MCG PO TABS
150.0000 ug | ORAL_TABLET | Freq: Every day | ORAL | Status: DC
  Administered 2024-04-24: 150 ug via ORAL
  Filled 2024-04-23: qty 1
  Filled 2024-04-23: qty 3

## 2024-04-23 MED ORDER — GENTAMICIN SULFATE 0.1 % EX OINT
1.0000 | TOPICAL_OINTMENT | Freq: Every day | CUTANEOUS | Status: DC
  Filled 2024-04-23: qty 15

## 2024-04-23 MED ORDER — FERROUS GLUCONATE 324 (38 FE) MG PO TABS: 324.0000 mg | ORAL_TABLET | Freq: Every day | ORAL | Status: DC

## 2024-04-23 MED ORDER — SENNOSIDES-DOCUSATE SODIUM 8.6-50 MG PO TABS
1.0000 | ORAL_TABLET | Freq: Every evening | ORAL | Status: DC | PRN
Start: 2024-04-23 — End: 2024-04-24

## 2024-04-23 MED ORDER — INSULIN ASPART 100 UNIT/ML IJ SOLN: 0.0000 [IU] | Freq: Every day | INTRAMUSCULAR | Status: DC

## 2024-04-23 MED ORDER — INSULIN ASPART 100 UNIT/ML IJ SOLN
0.0000 [IU] | Freq: Three times a day (TID) | INTRAMUSCULAR | Status: DC
  Administered 2024-04-24: 2 [IU] via SUBCUTANEOUS
  Filled 2024-04-23: qty 1

## 2024-04-23 MED ORDER — VITAMIN C 500 MG PO TABS
500.0000 mg | ORAL_TABLET | Freq: Every day | ORAL | Status: DC
  Administered 2024-04-24: 500 mg via ORAL
  Filled 2024-04-23: qty 1

## 2024-04-23 MED ORDER — CLOPIDOGREL BISULFATE 75 MG PO TABS
75.0000 mg | ORAL_TABLET | Freq: Every day | ORAL | Status: DC
  Administered 2024-04-24: 75 mg via ORAL
  Filled 2024-04-23: qty 1

## 2024-04-23 NOTE — Consult Note (Signed)
 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  Full thickness B lower legs likely r/t venous insufficiency L posterior lower leg red moist, anterior leg dry scabbed; R posterior leg red moist anterior leg dry scabbed  2.  Full thickness L thigh unknown etiology 80% tan 20% red  Pressure Injury POA: NA not pressure related  Measurement: see nursing flowsheet  Wound bed: as above  Drainage (amount, consistency, odor) see nursing flowsheet; tan exudate noted from L thigh wound  Periwound: edema and erythema  Dressing procedure/placement/frequency:  Cleanse B lower leg wounds with Vashe wound cleanser Soila [phone]) do not rinse and allow to air dry. Apply silver hydrofiber Soila [phone]) to open wound beds posterior legs and Xeroform gauze Soila [phone]) to scabbed areas anterior aspects.  Cover with ABD pads and secure with Kerlix roll gauze beginning right above toes and ending right below knees. Apply Ace bandage wrapped in same fashion as Kerlix for light compression.  Cleanse L thigh wound with Vashe, allow to air dry. Apply 1/4 thick layer of Santyl to wound bed daily, top with saline moist gauze, dry gauze and silicone foam.    POC discussed with bedside nurse. WOC team will not follow. Re-consult if further needs arise.   Thank you,    Powell Bar MSN, RN-BC, [employer]

## 2024-04-23 NOTE — H&P (Signed)
 History and Physical    [name] FMW:[id number] DOB: [birth year] DOA: 04/23/2024  DOS: the patient was seen and examined on 04/23/2024  PCP: [facility]   Patient coming from: Home  I have personally briefly reviewed patient's old medical records in [facility] and CareEverywhere  HPI:   [name] is a 68 y.o. year old female with past medical history of hypertension, hyperlipidemia, type 2 diabetes, CKD 3 being, bed bound 2/2 chronic wounds, class III obesity presenting to the ED from her facility for low hemoglobin.    Pt states her facility told her her lab work was abnormal and she needed to go to the ED for further workup.  She denies seeing any blood in her stool, urine and has not had any hemoptysis or hematemesis.  She states she feels quite at her baseline.  When discussed her medical history she states she has been at the facility for over 3 years now and is completely bedbound.  She does not know the reason why she is bedbound such as a stroke but states this has been the case.  I called the facility and they report patient has been bedbound and needs help with ADLs and IADLs.  They are not aware of any underlying issue that resulted in her being bedbound but patient refuses to move and has chronic wounds which are contributing to her being bedbound.  Per the facility patient refuses care quite often and when they want to work with for her mobility she refuses.   On arrival to the ED patient was noted to be HDS stable.  Lab work showed hemoglobin 7.1 in the ED.  Per the facility they obtained 6.3 prompting them to send her to the ED.  It did not show any leukocytosis.  Iron  studies were obtained and patient was given 1 unit by the EDP.   TRH contacted for admission.  Review of Systems: As mentioned in the history of present illness. All other systems reviewed and are negative.   Past Medical History:  Diagnosis Date   Acute congestive  heart failure (HCC) 09/26/2023   Diabetes mellitus without complication (HCC) 2010   Hypertension    Hypothyroidism    Kidney stone 11/2014   history of/STAGE 4 KIDNEY DISEASE PER DR DOUGLAS   Lymphedema    Multiple open wounds of lower leg    PT BEING SEEN BY THE WOUND CENTER FOR CHRONIC BLISTERS PER PT    Past Surgical History:  Procedure Laterality Date   AMPUTATION Right 05/03/2021   Procedure: AMPUTATION RAY;  Surgeon: Lennie Barter, DPM;  Location: [facility] ORS;  Service: Podiatry;  Laterality: Right;  RIght Big Toe and Right Fifth Toe   IRRIGATION AND DEBRIDEMENT FOOT Bilateral 05/03/2021   Procedure: IRRIGATION AND DEBRIDEMENT FEET;  Surgeon: Lennie Barter, DPM;  Location: [facility] ORS;  Service: Podiatry;  Laterality: Bilateral;   LOWER EXTREMITY ANGIOGRAPHY Right 04/27/2021   Procedure: Lower Extremity Angiography;  Surgeon: Jama Cordella MATSU, MD;  Location: [facility] INVASIVE CV LAB;  Service: Cardiovascular;  Laterality: Right;   LOWER EXTREMITY ANGIOGRAPHY Left 04/30/2021   Procedure: Lower Extremity Angiography;  Surgeon: Marea Selinda RAMAN, MD;  Location: [facility] INVASIVE CV LAB;  Service: Cardiovascular;  Laterality: Left;   TOTAL HIP ARTHROPLASTY Right 05/28/2015   Procedure: TOTAL HIP ARTHROPLASTY ANTERIOR APPROACH;  Surgeon: Ozell Flake, MD;  Location: [facility] ORS;  Service: Orthopedics;  Laterality: Right;     Allergies  Allergen Reactions   Cefepime  Other (  See Comments)    Encephalopathy -- precipitated by AKI and opioids    Family History  Problem Relation Age of Onset   Cancer Mother    Diabetes Father     Prior to Admission medications   Medication Sig Start Date End Date Taking? Authorizing Provider  acetaminophen  (TYLENOL ) 325 MG tablet Take 2 tablets (650 mg total) by mouth every 6 (six) hours as needed for mild pain (or Fever >/= 101). 05/18/21  Yes Wieting, Richard, MD  amoxicillin-clavulanate (AUGMENTIN) 500-125 MG tablet Take 1 tablet by mouth every 12 (twelve) hours.  02/07/24  Yes [provider]  ascorbic acid  (VITAMIN C ) 500 MG tablet Take 1 tablet (500 mg total) by mouth daily. 11/03/21  Yes Wieting, Richard, MD  bisacodyl  (DULCOLAX) 5 MG EC tablet Take 1 tablet (5 mg total) by mouth daily as needed for moderate constipation. 12/15/23  Yes Dorinda Drue DASEN, MD  clopidogrel  (PLAVIX ) 75 MG tablet Take 1 tablet (75 mg total) by mouth daily with breakfast. 05/19/21  Yes Wieting, Richard, MD  diclofenac  Sodium (VOLTAREN ) 1 % GEL Apply 2 g topically 3 (three) times daily.   Yes [provider]  ferrous gluconate  (FERGON) 324 MG tablet Take 324 mg by mouth daily with breakfast.   Yes [provider]  fluticasone  (FLONASE ) 50 MCG/ACT nasal spray Place 1 spray into both nostrils daily. 10/11/23  Yes [provider]  furosemide  (LASIX ) 20 MG tablet Take 1 tablet (20 mg total) by mouth daily. 02/16/24  Yes Franchot Novel, MD  gabapentin  (NEURONTIN ) 300 MG capsule Take 1 capsule (300 mg total) by mouth at bedtime. Patient taking differently: Take 300 mg by mouth every 12 (twelve) hours. 09/15/23  Yes Fausto Sor A, DO  gentamicin  ointment (GARAMYCIN ) 0.1 % Apply 1 Application topically daily. 12/07/23  Yes [provider]  hydrOXYzine  (ATARAX ) 10 MG tablet Take 10 mg by mouth every 8 (eight) hours as needed for itching.   Yes [provider]  insulin  aspart (NOVOLOG ) 100 UNIT/ML injection Inject 3 Units into the skin 3 (three) times daily with meals. Patient taking differently: Inject 2-12 Units into the skin 3 (three) times daily with meals. 05/18/21  Yes Wieting, Richard, MD  LANTUS  SOLOSTAR 100 UNIT/ML Solostar Pen Inject 12 Units into the skin every 12 (twelve) hours. Patient taking differently: Inject 14 Units into the skin every 12 (twelve) hours. 02/16/24  Yes Franchot Novel, MD  Levothyroxine  Sodium 150 MCG CAPS Take 150 mcg by mouth daily before breakfast.   Yes [provider]  metoprolol  succinate  (TOPROL -XL) 25 MG 24 hr tablet Take 0.5 tablets (12.5 mg total) by mouth daily with breakfast. 05/19/21  Yes Wieting, Richard, MD  Multiple Vitamin (MULTIVITAMIN WITH MINERALS) TABS tablet Take 1 tablet by mouth daily. 11/04/21  Yes Josette Ade, MD  NYSTATIN  powder Apply 1 application. topically 3 (three) times daily. 10/29/21  Yes [provider]  ondansetron  (ZOFRAN -ODT) 4 MG disintegrating tablet Take 4 mg by mouth every 6 (six) hours as needed for nausea or vomiting. 10/21/21  Yes [provider]  oxyCODONE  (OXY IR/ROXICODONE ) 5 MG immediate release tablet Take 5 mg by mouth every 4 (four) hours as needed for moderate pain (pain score 4-6). 11/30/23  Yes [provider]  senna (SENOKOT) 8.6 MG TABS tablet Take 2 tablets by mouth at bedtime as needed for mild constipation.   Yes [provider]  Vitamin D, Ergocalciferol, (DRISDOL) 1.25 MG (50000 UNIT) CAPS capsule Take 50,000 Units by  mouth every 7 (seven) days. Tuesday   Yes [provider]  blood glucose meter kit and supplies 1 each by Other route daily. ONE TOUCH ULTRA METER. Use as directed. E11.29 10/23/20   Joshua Cathryne BROCKS, MD  glucose blood test strip 1 each by Other route daily. ONE TOUCH ULTRA TEST STRIPS E11.29 10/26/20   Joshua Cathryne BROCKS, MD  Lancets [facility] ULTRASOFT) lancets 1 each by Other route daily. Use as instructed E11.29 10/23/20   Joshua Cathryne BROCKS, MD    Social History:  reports that she has never smoked. She has never used smokeless tobacco. She reports that she does not drink alcohol and does not use drugs. Lives at [facility] Tobacco- Denies use. EtOH- Denies use.  Illicit drug use- denies use.  IADLs/ADLs- Needs   Physical Exam: Vitals:   04/23/24 1235 04/23/24 1300 04/23/24 1317 04/23/24 1333  BP:   125/74 (!) 139/56  Pulse:   90 90  Resp:   15 17  Temp: 98.6 F (37 C) 97.8 F (36.6 C) 97.8 F (36.6 C) (!) 97.4 F (36.3 C)  TempSrc: Oral Oral Oral Oral  SpO2:   99%  100%  Weight:      Height:       Gen:NAD HENT: NCAT CV: RRR, good radial pulse, pedal pulse present on doppler Resp: CTAB Abd: No TTP, normal bowel sounds MSK: no asymmetry, bilateral legs wrapped, pt able to lift the right leg but not left which is chronic Skin: bilateral leg with unna, left thigh with ulcer stage 2 present (see media tab) Neuro: alert and oriented x4 Psych: pleasant mood   Labs on Admission: I have personally reviewed following labs and imaging studies  CBC: Recent Labs  Lab 04/23/24 1114  WBC 7.6  NEUTROABS 5.0  HGB 7.1*  HCT 25.2*  MCV 86.0  PLT 471*   Basic Metabolic Panel: Recent Labs  Lab 04/23/24 1114  NA 139  K 5.1  CL 110  CO2 22  GLUCOSE 150*  BUN 23  CREATININE 1.79*  CALCIUM  8.3*   GFR: Estimated Creatinine Clearance: 41.3 mL/min (A) (by C-G formula based on SCr of 1.79 mg/dL (H)). Liver Function Tests: No results for input(s): AST, ALT, ALKPHOS, BILITOT, PROT, ALBUMIN in the last 168 hours. No results for input(s): LIPASE, AMYLASE in the last 168 hours. No results for input(s): AMMONIA in the last 168 hours. Coagulation Profile: No results for input(s): INR, PROTIME in the last 168 hours. Cardiac Enzymes: No results for input(s): CKTOTAL, CKMB, CKMBINDEX, TROPONINI, TROPONINIHS in the last 168 hours. BNP (last 3 results) Recent Labs    09/04/23 1403 09/25/23 2328 02/16/24 0542  BNP 83.9 120.5* 128.9*   HbA1C: No results for input(s): HGBA1C in the last 72 hours. CBG: No results for input(s): GLUCAP in the last 168 hours. Lipid Profile: No results for input(s): CHOL, HDL, LDLCALC, TRIG, CHOLHDL, LDLDIRECT in the last 72 hours. Thyroid  Function Tests: No results for input(s): TSH, T4TOTAL, FREET4, T3FREE, THYROIDAB in the last 72 hours. Anemia Panel: Recent Labs    04/23/24 1114 04/23/24 1217  FERRITIN  --  48  TIBC  --  279  IRON   --  41  RETICCTPCT 3.7*   --    Urine analysis:    Component Value Date/Time   COLORURINE YELLOW (A) 02/13/2024 1226   APPEARANCEUR HAZY (A) 02/13/2024 1226   LABSPEC 1.011 02/13/2024 1226   PHURINE 6.0 02/13/2024 1226   GLUCOSEU NEGATIVE 02/13/2024 1226   HGBUR  NEGATIVE 02/13/2024 1226   BILIRUBINUR NEGATIVE 02/13/2024 1226   BILIRUBINUR negative 12/22/2014 0846   KETONESUR NEGATIVE 02/13/2024 1226   PROTEINUR 30 (A) 02/13/2024 1226   UROBILINOGEN 0.2 12/22/2014 0846   NITRITE NEGATIVE 02/13/2024 1226   LEUKOCYTESUR NEGATIVE 02/13/2024 1226    Radiological Exams on Admission: I have personally reviewed images No results found.  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  Getting 1 unit of blood.  Getting iron  studies as well including reticulocyte count and erythropoietin level.  I suspect this is a gradual decline secondary to her CKD.  She is on outpatient iron  supplementation.  No signs of blood loss by the patient or the facility and per EDP Hemoccult is negative.  Given her CKD I am getting an erythropoietin level and will consult nephrology as this appears to be mediated by chronic kidney disease as mentioned above.  On review of their previous notes it is appear they were considering initiating Epogen on an outpatient basis.     Chronic problems: CKD: Appears to be the primary driver of her anemia.  Will check A1c as her uncontrolled diabetes may be leading to progressive CKD.  Currently her creatinine is near her baseline and places her at CKD 3B category. Hypertension: Holding home metoprolol  given low hemoglobin but this can be resumed if she becomes hypertensive. Type 2 diabetes: Holding home insulin  and placing her on  SSI. Hypothyroidism: Resume home Synthroid  150 mcg Chronic wounds: Wound care consulted appreciate their care and recommendations. Hyperlipidemia: Not on a statin but is on Plavix  75 mg.  Will get lipid panel and she may benefit from being on a statin if she is agreeable.  VTE prophylaxis:  SCDs  Diet: HH Code Status:  Full Code Telemetry:  Admission status: Observation, Med-Surg Patient is from: Home Anticipated d/c is to: Home Anticipated d/c is in: 1-2 days   Family Communication: Updated at bedside, spoke with facility  Consults called: None   Severity of Illness: The appropriate patient status for this patient is OBSERVATION. Observation status is judged to be reasonable and necessary in order to provide the required intensity of service to ensure the patient's safety. The patient's presenting symptoms, physical exam findings, and initial radiographic and laboratory data in the context of their medical condition is felt to place them at decreased risk for further clinical deterioration. Furthermore, it is anticipated that the patient will be medically stable for discharge from the hospital within 2 midnights of admission.    Morene Bathe, MD Jolynn DEL. [facility]

## 2024-04-23 NOTE — Plan of Care (Signed)

## 2024-04-23 NOTE — Progress Notes (Signed)
 OT Cancellation Note  Patient Details Name: [name] MRN: [id number] DOB: [birth year]   Cancelled Treatment:    Reason Eval/Treat Not Completed: Medical issues which prohibited therapy. Consult received, chart reviewed. Pt currently receiving a blood transfusion. Hgb 7.1. Will hold and re-attempt OT evaluation next date as medically appropriate.   Shelsy Seng R., MPH, MS, OTR/L ascom [phone] 04/23/24, 3:55 PM

## 2024-04-23 NOTE — ED Provider Notes (Signed)
 [facility] Provider Note    Event Date/Time   First MD Initiated Contact with Patient 04/23/24 1130     (approximate)   History   Abnormal Lab and Hgb 6.3   HPI  AITANA BURRY is a 68 y.o. female with a history of diabetes, CHF, hypertension, hypothyroidism, CKD who presents with anemia.  Per EMS, labs were drawn at the patient's facility yesterday and showed hemoglobin of 6.3.  The patient states that she feels weak.  She denies shortness of breath or dizziness although she states that she is mostly bedbound.  She denies any prior history of anemia.  She has not noted any blood loss or dark stools.  Reviewed the past medical records.  The patient was most recently admitted to the hospitalist service in August with AKI.  She was thought to be dehydrated as well.   Physical Exam   Triage Vital Signs: ED Triage Vitals  Encounter Vitals Group     BP 04/23/24 1109 114/61     Girls Systolic BP Percentile --      Girls Diastolic BP Percentile --      Boys Systolic BP Percentile --      Boys Diastolic BP Percentile --      Pulse Rate 04/23/24 1109 93     Resp 04/23/24 1109 20     Temp 04/23/24 1109 98.3 F (36.8 C)     Temp Source 04/23/24 1109 Oral     SpO2 04/23/24 1109 100 %     Weight 04/23/24 1110 (!) 312 lb 6.3 oz (141.7 kg)     Height 04/23/24 1110 5' 4 (1.626 m)     Head Circumference --      Peak Flow --      Pain Score 04/23/24 1109 0     Pain Loc --      Pain Education --      Exclude from Growth Chart --     Most recent vital signs: Vitals:   04/23/24 1300 04/23/24 1317  BP:  125/74  Pulse:  90  Resp:  15  Temp: 97.8 F (36.6 C) 97.8 F (36.6 C)  SpO2:  99%     General: Alert, no distress.  CV:  Good peripheral perfusion.  Resp:  Normal effort.  Abd:  No distention.  Other:  Pale appearing.  Brown stool, guaiac negative.   ED Results / Procedures / Treatments   Labs (all labs ordered are listed, but only  abnormal results are displayed) Labs Reviewed  CBC WITH DIFFERENTIAL/PLATELET - Abnormal; Notable for the following components:      Result Value   RBC 2.93 (*)    Hemoglobin 7.1 (*)    HCT 25.2 (*)    MCH 24.2 (*)    MCHC 28.2 (*)    RDW 20.2 (*)    Platelets 471 (*)    Eosinophils Absolute 0.7 (*)    All other components within normal limits  BASIC METABOLIC PANEL WITH GFR - Abnormal; Notable for the following components:   Glucose, Bld 150 (*)    Creatinine, Ser 1.79 (*)    Calcium  8.3 (*)    GFR, Estimated 30 (*)    All other components within normal limits  IRON  AND TIBC  FERRITIN  TYPE AND SCREEN  PREPARE RBC (CROSSMATCH)     EKG   RADIOLOGY   PROCEDURES:  Critical Care performed: No  Procedures   MEDICATIONS ORDERED IN ED: Medications  0.9 %  sodium chloride  infusion (0 mL/hr Intravenous Hold 04/23/24 1218)     IMPRESSION / MDM / ASSESSMENT AND PLAN / ED COURSE  I reviewed the triage vital signs and the nursing notes.  68 year old female with PMH as noted above presents with anemia associated with generalized weakness.  On exam the vital signs are normal.  The patient's stool is brown and guaiac negative.  Differential diagnosis includes, but is not limited to, symptomatic anemia.  I am unsure if this is due to blood loss, CKD, or other cardiac etiology.  Will obtain repeat labs and reassess.  Patient's presentation is most consistent with severe exacerbation of chronic illness.  The patient is on the cardiac monitor to evaluate for evidence of arrhythmia and/or significant heart rate changes.  ----------------------------------------- 1:30 PM on 04/23/2024 -----------------------------------------  BMP shows elevated creatinine consistent with CKD.  CBC here shows a hemoglobin of 7.1 With a normal MCV.  I have added on iron , TIBC, and ferritin.  I have ordered 1 unit of PRBCs.  Since this is a new problem for the patient and the etiology of her  anemia is unknown, we will admit for further management.  I consulted Dr. Sonjia from the hospitalist service; based on our discussion he agrees to evaluate the patient for admission.    FINAL CLINICAL IMPRESSION(S) / ED DIAGNOSES   Final diagnoses:  Symptomatic anemia     Rx / DC Orders   ED Discharge Orders     None        Note:  This document was prepared using Dragon voice recognition software and may include unintentional dictation errors.    Jacolyn Pae, MD 04/23/24 1330

## 2024-04-23 NOTE — ED Notes (Signed)
 Lab called to collect blood work due to pt being difficult stick

## 2024-04-23 NOTE — ED Triage Notes (Signed)
 Arrives from [facility] with abnormal labs.  VS wnl. Per [facility]

## 2024-04-23 NOTE — ED Triage Notes (Signed)
 Pt to ED [facility] from [facility] for abnormal lab.  Pt has lab results with her from 10/27: Hgb was 6.3. Pt appears pale. Also R arm has been edematous for last few weeks. No erythema or pain. Pt is alert, oriented. Denies SOB, weakness, dizziness. States is bedbound pretty much. Has MOST form with her.

## 2024-04-23 NOTE — ED Notes (Addendum)
 Paper consent for blood transfusion signed

## 2024-04-24 DIAGNOSIS — E1169 Type 2 diabetes mellitus with other specified complication: Secondary | ICD-10-CM | POA: Diagnosis not present

## 2024-04-24 DIAGNOSIS — I959 Hypotension, unspecified: Secondary | ICD-10-CM | POA: Diagnosis not present

## 2024-04-24 DIAGNOSIS — E785 Hyperlipidemia, unspecified: Secondary | ICD-10-CM | POA: Diagnosis not present

## 2024-04-24 DIAGNOSIS — Z743 Need for continuous supervision: Secondary | ICD-10-CM | POA: Diagnosis not present

## 2024-04-24 DIAGNOSIS — L97919 Non-pressure chronic ulcer of unspecified part of right lower leg with unspecified severity: Secondary | ICD-10-CM

## 2024-04-24 DIAGNOSIS — D649 Anemia, unspecified: Secondary | ICD-10-CM | POA: Diagnosis not present

## 2024-04-24 DIAGNOSIS — L97929 Non-pressure chronic ulcer of unspecified part of left lower leg with unspecified severity: Secondary | ICD-10-CM

## 2024-04-24 DIAGNOSIS — N1832 Chronic kidney disease, stage 3b: Secondary | ICD-10-CM | POA: Diagnosis not present

## 2024-04-24 DIAGNOSIS — I87313 Chronic venous hypertension (idiopathic) with ulcer of bilateral lower extremity: Secondary | ICD-10-CM

## 2024-04-24 DIAGNOSIS — I739 Peripheral vascular disease, unspecified: Secondary | ICD-10-CM

## 2024-04-24 DIAGNOSIS — E039 Hypothyroidism, unspecified: Secondary | ICD-10-CM

## 2024-04-24 DIAGNOSIS — Z7401 Bed confinement status: Secondary | ICD-10-CM | POA: Diagnosis not present

## 2024-04-24 DIAGNOSIS — D631 Anemia in chronic kidney disease: Secondary | ICD-10-CM | POA: Diagnosis not present

## 2024-04-24 DIAGNOSIS — I10 Essential (primary) hypertension: Secondary | ICD-10-CM

## 2024-04-24 LAB — GLUCOSE, CAPILLARY
Glucose-Capillary: 101 mg/dL — ABNORMAL HIGH (ref 70–99)
Glucose-Capillary: 132 mg/dL — ABNORMAL HIGH (ref 70–99)
Glucose-Capillary: 153 mg/dL — ABNORMAL HIGH (ref 70–99)

## 2024-04-24 LAB — TYPE AND SCREEN
ABO/RH(D): O POS
Antibody Screen: NEGATIVE
Unit division: 0

## 2024-04-24 LAB — BASIC METABOLIC PANEL WITH GFR
Anion gap: 9 (ref 5–15)
BUN: 25 mg/dL — ABNORMAL HIGH (ref 8–23)
CO2: 22 mmol/L (ref 22–32)
Calcium: 8 mg/dL — ABNORMAL LOW (ref 8.9–10.3)
Chloride: 109 mmol/L (ref 98–111)
Creatinine, Ser: 1.5 mg/dL — ABNORMAL HIGH (ref 0.44–1.00)
GFR, Estimated: 37 mL/min — ABNORMAL LOW (ref >60)
Glucose, Bld: 118 mg/dL — ABNORMAL HIGH (ref 70–99)
Potassium: 4.7 mmol/L (ref 3.5–5.1)
Sodium: 140 mmol/L (ref 135–145)

## 2024-04-24 LAB — CBC
HCT: 26 % — ABNORMAL LOW (ref 36.0–46.0)
Hemoglobin: 7.7 g/dL — ABNORMAL LOW (ref 12.0–15.0)
MCH: 24.9 pg — ABNORMAL LOW (ref 26.0–34.0)
MCHC: 29.6 g/dL — ABNORMAL LOW (ref 30.0–36.0)
MCV: 84.1 fL (ref 80.0–100.0)
Platelets: 423 K/uL — ABNORMAL HIGH (ref 150–400)
RBC: 3.09 MIL/uL — ABNORMAL LOW (ref 3.87–5.11)
RDW: 19.1 % — ABNORMAL HIGH (ref 11.5–15.5)
WBC: 6.3 K/uL (ref 4.0–10.5)
nRBC: 0 % (ref 0.0–0.2)

## 2024-04-24 LAB — BPAM RBC
Blood Product Expiration Date: 202511262359
ISSUE DATE / TIME: 202510281307
Unit Type and Rh: 5100

## 2024-04-24 MED ORDER — VITAMIN D (ERGOCALCIFEROL) 1.25 MG (50000 UNIT) PO CAPS: 50000.0000 [IU] | ORAL_CAPSULE | ORAL | Status: DC

## 2024-04-24 MED ORDER — METOPROLOL SUCCINATE ER 25 MG PO TB24
12.5000 mg | ORAL_TABLET | Freq: Every day | ORAL | Status: DC
  Administered 2024-04-24: 12.5 mg via ORAL
  Filled 2024-04-24: qty 1

## 2024-04-24 MED ORDER — FLUTICASONE PROPIONATE 50 MCG/ACT NA SUSP
1.0000 | Freq: Every day | NASAL | Status: DC
  Filled 2024-04-24: qty 16

## 2024-04-24 MED ORDER — DARBEPOETIN ALFA 40 MCG/0.4ML IJ SOSY
40.0000 ug | PREFILLED_SYRINGE | INTRAMUSCULAR | Status: DC
  Filled 2024-04-24: qty 0.4

## 2024-04-24 MED ORDER — METOPROLOL SUCCINATE ER 25 MG PO TB24: 25.0000 mg | ORAL_TABLET | Freq: Every day | ORAL | Status: DC

## 2024-04-24 MED ORDER — HYDROXYZINE HCL 10 MG PO TABS
10.0000 mg | ORAL_TABLET | Freq: Three times a day (TID) | ORAL | Status: DC | PRN
Start: 2024-04-24 — End: 2024-04-24

## 2024-04-24 MED ORDER — ADULT MULTIVITAMIN W/MINERALS CH
1.0000 | ORAL_TABLET | Freq: Every day | ORAL | Status: DC
  Administered 2024-04-24: 1 via ORAL
  Filled 2024-04-24: qty 1

## 2024-04-24 MED ORDER — GLUCOSE BLOOD VI STRP: 1.0000 | ORAL_STRIP | Freq: Every day | Status: DC

## 2024-04-24 MED ORDER — DARBEPOETIN ALFA 40 MCG/0.4ML IJ SOSY: 40.0000 ug | PREFILLED_SYRINGE | INTRAMUSCULAR | Status: AC

## 2024-04-24 NOTE — Progress Notes (Signed)
 PT Cancellation Note  Patient Details Name: [name] MRN: [id number] DOB: Oct 29, 1955   Cancelled Treatment:    Reason Eval/Treat Not Completed: PT screened, no needs identified, will sign off PT order received and chart reviewed to date. Per OT, pt remains bed bound at baseline and was refusing mobility for evaluation. Pt is at her baseline, no acute needs identified. PT to sign off, can be re-consulted if acute need arises.   Emmalina Espericueta, SPT

## 2024-04-24 NOTE — TOC Initial Note (Addendum)
 Transition of Care [facility]) - Initial/Assessment Note    Patient Details  Name: [name] MRN: [id number] Date of Birth: Aug 08, 1955  [facility] CM/SW Contact:    Dalia GORMAN Fuse, RN Phone Number: 04/24/2024, 8:22 AM  Clinical Narrative:                 Patient is from LTC at [facility]. She is chronically bed bound. She presented to the ED after the facility received an alert for a HGB 6.3. In the ED her HGB is 7.1 Iron  studies were obtained and she received 1 unit PRBCs.  Current HGB 7.7.  TOC spoke with the patient in her room and the plan is for the patient to return to [facility] when she is medically appropriate. TOC placed call to Barnie at [facility] to confirm the patient can return to the facility.  Expected Discharge Plan: Long Term Nursing Home Barriers to Discharge: Continued Medical Work up   Patient Goals and CMS Choice            Expected Discharge Plan and Services   Discharge Planning Services: CM Consult   Living arrangements for the past 2 months: Skilled Nursing Facility                                      Prior Living Arrangements/Services Living arrangements for the past 2 months: Skilled Nursing Facility Lives with:: Facility Resident                   Activities of Daily Living   ADL Screening (condition at time of admission) Independently performs ADLs?: No Does the patient have a NEW difficulty with bathing/dressing/toileting/self-feeding that is expected to last >3 days?: No Does the patient have a NEW difficulty with getting in/out of bed, walking, or climbing stairs that is expected to last >3 days?: No Does the patient have a NEW difficulty with communication that is expected to last >3 days?: No Is the patient deaf or have difficulty hearing?: No Does the patient have difficulty seeing, even when wearing glasses/contacts?: No Does the patient have difficulty concentrating, remembering, or making decisions?:  No  Permission Sought/Granted                  Emotional Assessment       Orientation: : Oriented to Self, Oriented to Place, Oriented to  Time, Oriented to Situation      Admission diagnosis:  Anemia [D64.9] Symptomatic anemia [D64.9] Patient Active Problem List   Diagnosis Date Noted   Anemia 04/23/2024   AKI (acute kidney injury) 02/13/2024   Cellulitis of left lower extremity 12/08/2023   Leg pain 12/08/2023   Hyperkalemia 12/08/2023   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) 10/02/2023   Altered mental status 09/29/2023   Acute congestive heart failure (HCC) 09/26/2023   Acute encephalopathy 09/24/2023   Multiple open wounds of lower leg, initial encounter 09/12/2023   Thrombocytosis 09/10/2023   Anemia of chronic disease 09/10/2023   Osteomyelitis of left fibula (HCC) 09/09/2023   Type II diabetes mellitus with renal manifestations (HCC) 09/04/2023   Chronic systolic CHF (congestive heart failure) (HCC) 09/04/2023   Obesity, Class III, BMI 40-49.9 (morbid obesity) (HCC) 09/04/2023   DKA (diabetic ketoacidosis) (HCC) 10/30/2021   PVD (peripheral vascular disease)    Vertigo    Sepsis without acute organ dysfunction (HCC)  Acute kidney injury superimposed on CKD    Adjustment disorder with anxiety 04/21/2021   Sepsis (HCC) 04/17/2021   Pressure injury of skin 04/17/2021   Left leg pain 04/16/2021   Morbid obesity (HCC) 10/24/2017   Chronic kidney disease, stage IV (severe) (HCC) 10/24/2017   Hypothyroidism 11/15/2016   Edema 11/15/2016   Type 2 diabetes mellitus with hyperlipidemia (HCC) 11/15/2016   Essential hypertension 12/09/2015   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 [place] ([address]) [place] [address] Phone: [phone] Fax: [phone]     Social Drivers of Health (SDOH) Social History: SDOH Screenings   Food Insecurity: No Food Insecurity (04/23/2024)  Housing: Low Risk  (04/23/2024)  Transportation Needs: No Transportation Needs (04/23/2024)  Utilities: Not At Risk (04/23/2024)  Alcohol Screen: Low Risk  (03/10/2021)  Depression (PHQ2-9): Low Risk  (03/10/2021)  Financial Resource Strain: Low Risk  (03/10/2021)  Physical Activity: Inactive (03/10/2021)  Social Connections: Socially Isolated (04/23/2024)  Stress: No Stress Concern Present (03/10/2021)  Tobacco Use: Low Risk  (04/23/2024)   SDOH Interventions:     Readmission Risk Interventions    11/03/2021   12:41 PM  Readmission Risk Prevention Plan  Transportation Screening Complete  PCP or Specialist Appt within 3-5 Days Complete  HRI or Home Care Consult Complete  Social Work Consult for Recovery Care Planning/Counseling Complete  Palliative Care Screening Complete  Medication Review Oceanographer) Complete

## 2024-04-24 NOTE — NC FL2 (Addendum)
 [place]  MEDICAID FL2 LEVEL OF CARE FORM     IDENTIFICATION  Patient Name: [name] Birthdate: [birth year] Sex: female Admission Date (Current Location): 04/23/2024  [facility] and [place] Number:  Chiropodist and Address:  [facility], [address] [place], [place] [address]      Provider Number: [id number]  Attending Physician Name and Address:  Leesa Kast, DO  Relative Name and Phone Number:  Jasmine Regina HERO (Friend)  [phone]    Current Level of Care: Hospital Recommended Level of Care: Skilled Nursing Facility Prior Approval Number:    Date Approved/Denied:   PASRR Number:  [id number]   Discharge Plan: SNF    Current Diagnoses: Patient Active Problem List   Diagnosis Date Noted   Anemia 04/23/2024   AKI (acute kidney injury) 02/13/2024   Cellulitis of left lower extremity 12/08/2023   Leg pain 12/08/2023   Hyperkalemia 12/08/2023   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) 10/02/2023   Altered mental status 09/29/2023   Acute congestive heart failure (HCC) 09/26/2023   Acute encephalopathy 09/24/2023   Multiple open wounds of lower leg, initial encounter 09/12/2023   Thrombocytosis 09/10/2023   Anemia of chronic disease 09/10/2023   Osteomyelitis of left fibula (HCC) 09/09/2023   Type II diabetes mellitus with renal manifestations (HCC) 09/04/2023   Chronic systolic CHF (congestive heart failure) (HCC) 09/04/2023   Obesity, Class III, BMI 40-49.9 (morbid obesity) (HCC) 09/04/2023   DKA (diabetic ketoacidosis) (HCC) 10/30/2021   PVD (peripheral vascular disease)    Vertigo    Sepsis without acute organ dysfunction (HCC)    Acute kidney injury superimposed on CKD    Adjustment disorder with anxiety 04/21/2021   Sepsis (HCC) 04/17/2021   Pressure injury of skin 04/17/2021   Left leg pain 04/16/2021   Morbid obesity (HCC) 10/24/2017   Chronic kidney disease, stage IV (severe) (HCC)  10/24/2017   Hypothyroidism 11/15/2016   Edema 11/15/2016   Type 2 diabetes mellitus with hyperlipidemia (HCC) 11/15/2016   Essential hypertension 12/09/2015   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) 06/26/2015   Pressure ulcer 05/31/2015   Primary osteoarthritis of right hip 05/28/2015    Orientation RESPIRATION BLADDER Height & Weight     Self, Time, Situation, Place    Incontinent Weight: (!) 141.7 kg Height:  5' 4 (162.6 cm)  BEHAVIORAL SYMPTOMS/MOOD NEUROLOGICAL BOWEL NUTRITION STATUS      Incontinent Diet (Heart healthy)  AMBULATORY STATUS COMMUNICATION OF NEEDS Skin   Extensive Assist Verbally                         Personal Care Assistance Level of Assistance  Feeding, Bathing, Dressing Bathing Assistance: Maximum assistance Feeding assistance: Limited assistance Dressing Assistance: Maximum assistance     Functional Limitations Info             SPECIAL CARE FACTORS FREQUENCY                       Contractures      Additional Factors Info  Code Status, Allergies Code Status Info: FULL Allergies Info: Cefepime            Current Medications (04/24/2024):  This is the current hospital active medication list Current Facility-Administered Medications  Medication Dose Route Frequency Provider Last Rate Last Admin   0.9 %  sodium chloride  infusion  10 mL/hr Intravenous Once Siadecki, Sebastian,  MD   Held at 04/23/24 1218   acetaminophen  (TYLENOL ) tablet 650 mg  650 mg Oral Q6H PRN Khan, Ghalib, MD   650 mg at 04/23/24 1844   Or   acetaminophen  (TYLENOL ) suppository 650 mg  650 mg Rectal Q6H PRN Fernand Prost, MD       ascorbic acid  (VITAMIN C ) tablet 500 mg  500 mg Oral Daily Fernand Prost, MD       clopidogrel  (PLAVIX ) tablet 75 mg  75 mg Oral Q breakfast Fernand Prost, MD       collagenase (SANTYL) ointment   Topical Daily Fernand Prost, MD       fluticasone  (FLONASE ) 50 MCG/ACT nasal spray 1 spray  1  spray Each Nare Daily Fernand Prost, MD       gabapentin  (NEURONTIN ) capsule 300 mg  300 mg Oral QHS Khan, Ghalib, MD       gentamicin  ointment (GARAMYCIN ) 0.1 % 1 Application  1 Application Topical Daily Fernand Prost, MD       hydrOXYzine  (ATARAX ) tablet 10 mg  10 mg Oral Q8H PRN Fernand Prost, MD       insulin  aspart (novoLOG ) injection 0-15 Units  0-15 Units Subcutaneous TID WC Fernand Prost, MD       insulin  aspart (novoLOG ) injection 0-5 Units  0-5 Units Subcutaneous QHS Khan, Ghalib, MD       levothyroxine  (SYNTHROID ) tablet 150 mcg  150 mcg Oral QAC breakfast Khan, Ghalib, MD   150 mcg at 04/24/24 0541   multivitamin with minerals tablet 1 tablet  1 tablet Oral Daily Fernand Prost, MD       oxyCODONE  (Oxy IR/ROXICODONE ) immediate release tablet 5 mg  5 mg Oral Q4H PRN Khan, Ghalib, MD   5 mg at 04/24/24 0536   senna-docusate (Senokot-S) tablet 1 tablet  1 tablet Oral QHS PRN Fernand Prost, MD       sodium chloride  flush (NS) 0.9 % injection 3 mL  3 mL Intravenous Q12H Khan, Ghalib, MD   3 mL at 04/23/24 2150   [START ON 04/30/2024] Vitamin D (Ergocalciferol) (DRISDOL) 1.25 MG (50000 UNIT) capsule 50,000 Units  50,000 Units Oral Q7 days Fernand Prost, MD         Discharge Medications: Please see discharge summary for a list of discharge medications.  Relevant Imaging Results:  Relevant Lab Results:   Additional Information SS#: [id number]  Dalia GORMAN Fuse, RN

## 2024-04-24 NOTE — TOC Transition Note (Addendum)
 Transition of Care [facility]) - Discharge Note   Patient Details  Name: [name] MRN: [id number] Date of Birth: [birth year]  Transition of Care [employer]) CM/SW Contact:  Dalia GORMAN Fuse, RN Phone Number: 04/24/2024, 3:14 PM   Clinical Narrative:     Patient is medically clear to discharge back to LTC at [facility]. TOC sent DC Summary and orders in the hub. [facility] to transport.  Nurse to call report to [phone] She is 4th in line for transport  Final next level of care: Skilled Nursing Facility Barriers to Discharge: Continued Medical Work up   Patient Goals and CMS Choice            Discharge Placement                Patient to be transferred to facility by: [facility] Name of family member notified: Patient notified by clinical team Patient and family notified of of transfer: 04/24/24  Discharge Plan and Services Additional resources added to the After Visit Summary for     Discharge Planning Services: CM Consult                                 Social Drivers of Health (SDOH) Interventions SDOH Screenings   Food Insecurity: No Food Insecurity (04/23/2024)  Housing: Low Risk  (04/23/2024)  Transportation Needs: No Transportation Needs (04/23/2024)  Utilities: Not At Risk (04/23/2024)  Alcohol Screen: Low Risk  (03/10/2021)  Depression (PHQ2-9): Low Risk  (03/10/2021)  Financial Resource Strain: Low Risk  (03/10/2021)  Physical Activity: Inactive (03/10/2021)  Social Connections: Socially Isolated (04/23/2024)  Stress: No Stress Concern Present (03/10/2021)  Tobacco Use: Low Risk  (04/23/2024)     Readmission Risk Interventions    11/03/2021   12:41 PM  Readmission Risk Prevention Plan  Transportation Screening Complete  PCP or Specialist Appt within 3-5 Days Complete  HRI or Home Care Consult Complete  Social Work Consult for Recovery Care Planning/Counseling Complete  Palliative Care Screening Complete  Medication Review Oceanographer)  Complete

## 2024-04-24 NOTE — Progress Notes (Signed)
 [facility]  ROUNDING NOTE   Subjective:   [name] is a 68 y.o. female with past medical history of chronic bilateral lower extremity wounds, left fibula osteomyelitis, chronic systolic heart failure ejection fraction 45%, chronic lymphedema, hypertension, diabetes mellitus type 2, and chronic kidney disease stage IIIb. Patient presents from SNF with abnormal labs. She has been admitted for Anemia [D64.9] Symptomatic anemia [D64.9]  Patient is known to our practice from previous admissions. She is seen sitting up in bed eating breakfast. States she is bedbound at nursing facility. Did report increased fatigue. Unsure of dark stools. Room air, no signs of shortness of breath. Edema in lower extremities, ace bandages in place on both legs.   Labs on ED arrival concerning for potassium 5.1, creatinine 1.79 with GFR 30, hemoglobin 7.1 with platelets 471.  Patient did receive 1 unit blood transfusion in emergency department.  We have been consulted to assist in management of anemia.   Objective:  Vital signs in last 24 hours:  Temp:  [97.4 F (36.3 C)-98.6 F (37 C)] 98.3 F (36.8 C) (10/29 0724) Pulse Rate:  [72-92] 72 (10/29 0724) Resp:  [17-20] 19 (10/29 0724) BP: (139-164)/(56-88) 141/70 (10/29 0724) SpO2:  [95 %-100 %] 95 % (10/29 0724) Weight:  [141.7 kg] 141.7 kg (10/29 0504)  Weight change:  Filed Weights   04/23/24 1110 04/24/24 0504  Weight: (!) 141.7 kg (!) 141.7 kg    Intake/Output: I/O last 3 completed shifts: In: 590.5 [P.O.:240; Blood:350.5] Out: 800 [Urine:800]   Intake/Output this shift:  Total I/O In: 240 [P.O.:240] Out: 1250 [Urine:1250]  Physical Exam: General: NAD  Head: Normocephalic, atraumatic. Moist oral mucosal membranes  Eyes: Anicteric  Lungs:  Clear to auscultation, normal effort  Heart: Regular rate and rhythm  Abdomen:  Soft, nontender, obese  Extremities: 2+ peripheral edema.  Neurologic: Awake, alert, conversant   Skin: Warm,dry, Lower extremities Ace bandages       Basic Metabolic Panel: Recent Labs  Lab 04/23/24 1114 04/23/24 1747 04/24/24 0447  NA 139  --  140  K 5.1  --  4.7  CL 110  --  109  CO2 22  --  22  GLUCOSE 150*  --  118*  BUN 23  --  25*  CREATININE 1.79*  --  1.50*  CALCIUM  8.3*  --  8.0*  MG  --  2.1  --     Liver Function Tests: No results for input(s): AST, ALT, ALKPHOS, BILITOT, PROT, ALBUMIN in the last 168 hours. No results for input(s): LIPASE, AMYLASE in the last 168 hours. No results for input(s): AMMONIA in the last 168 hours.  CBC: Recent Labs  Lab 04/23/24 1114 04/23/24 1747 04/24/24 0447  WBC 7.6  --  6.3  NEUTROABS 5.0  --   --   HGB 7.1* 8.1* 7.7*  HCT 25.2* 28.0* 26.0*  MCV 86.0  --  84.1  PLT 471*  --  423*    Cardiac Enzymes: No results for input(s): CKTOTAL, CKMB, CKMBINDEX, TROPONINI in the last 168 hours.  BNP: Invalid input(s): POCBNP  CBG: Recent Labs  Lab 04/23/24 2309 04/24/24 0725 04/24/24 1136  GLUCAP 167* 101* 132*    Microbiology: Results for orders placed or performed during the hospital encounter of 02/13/24  MRSA Next Gen by PCR, Nasal     Status: None   Collection Time: 02/15/24  5:59 PM   Specimen: Nasal Mucosa; Nasal Swab  Result Value Ref Range Status   MRSA  by PCR Next Gen NOT DETECTED NOT DETECTED Final    Comment: (NOTE) The GeneXpert MRSA Assay (FDA approved for NASAL specimens only), is one component of a comprehensive MRSA colonization surveillance program. It is not intended to diagnose MRSA infection nor to guide or monitor treatment for MRSA infections. Test performance is not FDA approved in patients less than 70 years old. Performed at [facility], [address] [place], [place] [address]     Coagulation Studies: No results for input(s): LABPROT, INR in the last 72 hours.  Urinalysis: No results for input(s): COLORURINE, LABSPEC,  PHURINE, GLUCOSEU, HGBUR, BILIRUBINUR, KETONESUR, PROTEINUR, UROBILINOGEN, NITRITE, LEUKOCYTESUR in the last 72 hours.  Invalid input(s): APPERANCEUR    Imaging: No results found.   Medications:    sodium chloride  Stopped (04/23/24 1218)    ascorbic acid   500 mg Oral Daily   clopidogrel   75 mg Oral Q breakfast   collagenase   Topical Daily   darbepoetin (ARANESP) injection - DIALYSIS  40 mcg Subcutaneous Q Wed-1800   fluticasone   1 spray Each Nare Daily   gabapentin   300 mg Oral QHS   gentamicin  ointment  1 Application Topical Daily   insulin  aspart  0-15 Units Subcutaneous TID WC   insulin  aspart  0-5 Units Subcutaneous QHS   levothyroxine   150 mcg Oral QAC breakfast   metoprolol  succinate  12.5 mg Oral Daily   multivitamin with minerals  1 tablet Oral Daily   sodium chloride  flush  3 mL Intravenous Q12H   [START ON 04/30/2024] Vitamin D (Ergocalciferol)  50,000 Units Oral Q7 days   acetaminophen  **OR** acetaminophen , hydrOXYzine , oxyCODONE , senna-docusate  Assessment/ Plan:  [name] is a 68 y.o.  female  with past medical history of chronic bilateral lower extremity wounds, left fibula osteomyelitis, chronic systolic heart failure ejection fraction 45%, chronic lymphedema, hypertension, diabetes mellitus type 2, and chronic kidney disease stage IIIb.SABRA   Chronic kidney disease stage IIIb with baseline creatinine 1.38 and GFR of 41 on 12/15/23.  Chronic kidney disease is secondary to hypertension, diabetes, and CAD Creatinine on admission 1.79.  Remains within acceptable target.  Will continue to monitor renal function.  Lab Results  Component Value Date   CREATININE 1.50 (H) 04/24/2024   CREATININE 1.79 (H) 04/23/2024   CREATININE 2.82 (H) 02/16/2024    Intake/Output Summary (Last 24 hours) at 04/24/2024 1332 Last data filed at 04/24/2024 1100 Gross per 24 hour  Intake 830.5 ml  Output 2050 ml  Net -1219.5 ml   2. Anemia of chronic  kidney disease normocytic Lab Results  Component Value Date   HGB 7.7 (L) 04/24/2024    Hemoglobin 7.1 on ED arrival, received 1 unit blood transfusion in emergency department.  Will order Aranesp 40 mcg weekly for this patient.  Will recommend referral to hematology at discharge to continue management.  3. Hypertension with chronic kidney disease. Home regimen includes furosemide  and metoprolol . Furosemide  held     LOS: 0 Keo Schirmer 10/29/20251:32 PM

## 2024-04-24 NOTE — Care Management Obs Status (Signed)
 MEDICARE OBSERVATION STATUS NOTIFICATION   Patient Details  Name: [name] MRN: [id number] Date of Birth: 1955/11/07   Medicare Observation Status Notification Given:  Yes    Tekia Waterbury W, CMA 04/24/2024, 10:55 AM

## 2024-04-24 NOTE — Progress Notes (Signed)
 Pt asked by nurse if she would let staff change her dressings.  Pt refused dressing changes at this time.  Will reassess if pt willing to allow dressing change at later time.

## 2024-04-24 NOTE — Progress Notes (Signed)
 OT Cancellation Note  Patient Details Name: [name] MRN: [id number] DOB: 16-May-1956   Cancelled Treatment:    Reason Eval/Treat Not Completed: OT screened, no needs identified, will sign off Per chart, pt requires assist for all ADL/IADL at LTC facility but she does feed herself with set up. 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 She is noted to be feeding herself upon my visit today. No OT need identified. OT will sign off please reconsult if there is a change in functional status.   Therisa Sheffield, OTD OTR/L  04/24/24, 8:57 AM

## 2024-04-24 NOTE — Discharge Summary (Signed)
 DISCHARGE SUMMARY    [name] FMW:[id number] DOB: 07/22/1955 DOA: 04/23/2024  PCP: [facility]  Admit date: 04/23/2024 Discharge date: 04/24/2024   Recommendations for Outpatient Follow-up:  Follow up with PCP in 1-2 weeks to review chronic condition management Referral to hematology for continued EPO weekly  Hospital Course: [name] is a 68 year old female with hypertension, hyperlipidemia, type 2 diabetes, CKD stage IIIb, bedbound with chronic leg wounds, class III obesity BMI 53, who presents from her facility due to abnormal lab work.  On arrival to the ED patient's hemoglobin was 7.1 (facility reported hemoglobin of 6.3), iron  studies were obtained and patient was given 1 unit PRBC.  Hemoglobin rose to 8.1 after transfusion and was 7.7 on day of discharge.  No acute bleeding noted though patient does have chronic wounds which likely are chronically oozing.  Chronic anemia appears to be primarily secondary to CKD. Nephrology was consulted who initiated EPO and recommended continued weekly Aranesp 40 mcg. Referral to hematology was placed at discharge  On 10/29 patient reported feeling at her baseline and ready for return to her living facility.  Chronic conditions: Chronic bilateral lower extremity leg wounds Chronic systolic heart failure Chronic lymphedema Hypertension Type 2 diabetes CKD stage IIIb Class III obesity Hypothyroidism BMI 53 Bedbound status Hyperlipidemia - Continue all home meds without changes. - Encourage outpatient follow-up with PCP for chronic condition management. - Patient needs updated lipid profile, may benefit from statin - Hemoglobin A1c appears well-controlled at this time.  Discharge Instructions  Discharge Instructions     Ambulatory referral to Hematology / Oncology   Complete by: As directed    Call MD for:  difficulty breathing, headache or visual disturbances   Complete by: As directed    Call MD  for:  persistant dizziness or light-headedness   Complete by: As directed    Call MD for:  persistant nausea and vomiting   Complete by: As directed    Call MD for:  severe uncontrolled pain   Complete by: As directed    Call MD for:  temperature >100.4   Complete by: As directed    Diet general   Complete by: As directed    Discharge instructions   Complete by: As directed    Follow up with your primary care physician to discuss the medication changes during this admission.  You have been referred to hematology for discussion regarding continued use of EPO outpatient.   Increase activity slowly   Complete by: As directed    No wound care   Complete by: As directed       Allergies as of 04/24/2024       Reactions   Cefepime  Other (See Comments)   Encephalopathy -- precipitated by AKI and opioids        Medication List     STOP taking these medications    amoxicillin-clavulanate 500-125 MG tablet Commonly known as: AUGMENTIN       TAKE these medications    acetaminophen  325 MG tablet Commonly known as: TYLENOL  Take 2 tablets (650 mg total) by mouth every 6 (six) hours as needed for mild pain (or Fever >/= 101).   ascorbic acid  500 MG tablet Commonly known as: VITAMIN C  Take 1 tablet (500 mg total) by mouth daily.   bisacodyl  5 MG EC tablet Commonly known as: DULCOLAX Take 1 tablet (5 mg total) by mouth daily as needed for moderate constipation.   blood glucose meter kit and supplies 1  each by Other route daily. ONE TOUCH ULTRA METER. Use as directed. E11.29   clopidogrel  75 MG tablet Commonly known as: PLAVIX  Take 1 tablet (75 mg total) by mouth daily with breakfast.   Darbepoetin Alfa 40 MCG/0.4ML Sosy injection Commonly known as: ARANESP Inject 0.4 mLs (40 mcg total) into the skin every Wednesday at 6 PM.   diclofenac  Sodium 1 % Gel Commonly known as: VOLTAREN  Apply 2 g topically 3 (three) times daily.   ferrous gluconate  324 MG tablet Commonly  known as: FERGON Take 324 mg by mouth daily with breakfast.   fluticasone  50 MCG/ACT nasal spray Commonly known as: FLONASE  Place 1 spray into both nostrils daily.   furosemide  20 MG tablet Commonly known as: LASIX  Take 1 tablet (20 mg total) by mouth daily.   gabapentin  300 MG capsule Commonly known as: NEURONTIN  Take 1 capsule (300 mg total) by mouth at bedtime. What changed: when to take this   gentamicin  ointment 0.1 % Commonly known as: GARAMYCIN  Apply 1 Application topically daily.   glucose blood test strip 1 each by Other route daily. ONE TOUCH ULTRA TEST STRIPS E11.29   hydrOXYzine  10 MG tablet Commonly known as: ATARAX  Take 10 mg by mouth every 8 (eight) hours as needed for itching.   insulin  aspart 100 UNIT/ML injection Commonly known as: novoLOG  Inject 3 Units into the skin 3 (three) times daily with meals. What changed: how much to take   Lantus  SoloStar 100 UNIT/ML Solostar Pen Generic drug: insulin  glargine Inject 12 Units into the skin every 12 (twelve) hours. What changed: how much to take   Levothyroxine  Sodium 150 MCG Caps Take 150 mcg by mouth daily before breakfast.   metoprolol  succinate 25 MG 24 hr tablet Commonly known as: TOPROL -XL Take 0.5 tablets (12.5 mg total) by mouth daily with breakfast.   multivitamin with minerals Tabs tablet Take 1 tablet by mouth daily.   nystatin  powder Generic drug: nystatin  Apply 1 application. topically 3 (three) times daily.   ondansetron  4 MG disintegrating tablet Commonly known as: ZOFRAN -ODT Take 4 mg by mouth every 6 (six) hours as needed for nausea or vomiting.   onetouch ultrasoft lancets 1 each by Other route daily. Use as instructed E11.29   oxyCODONE  5 MG immediate release tablet Commonly known as: Oxy IR/ROXICODONE  Take 5 mg by mouth every 4 (four) hours as needed for moderate pain (pain score 4-6).   senna 8.6 MG Tabs tablet Commonly known as: SENOKOT Take 2 tablets by mouth at bedtime  as needed for mild constipation.   Vitamin D (Ergocalciferol) 1.25 MG (50000 UNIT) Caps capsule Commonly known as: DRISDOL Take 50,000 Units by mouth every 7 (seven) days. Tuesday        Follow-up Information     [facility] Follow up.   Specialty: Geriatric Medicine Why: hospital follow up Contact information: [address] [place] [address] [phone]                Allergies  Allergen Reactions   Cefepime  Other (See Comments)    Encephalopathy -- precipitated by AKI and opioids    Consultations:    Procedures/Studies: No results found.    Discharge Exam: Vitals:   04/24/24 0504 04/24/24 0724  BP: (!) 164/88 (!) 141/70  Pulse: 88 72  Resp: 19 19  Temp: 98.2 F (36.8 C) 98.3 F (36.8 C)  SpO2: 98% 95%   Vitals:   04/23/24 1531 04/23/24 2028 04/24/24 0504 04/24/24 9275  BP: 139/71 (!) 157/80 (!) 164/88 (!) 141/70  Pulse: 87 92 88 72  Resp: 18 20 19 19   Temp: 98.1 F (36.7 C) 98.6 F (37 C) 98.2 F (36.8 C) 98.3 F (36.8 C)  TempSrc: Oral     SpO2: 100% 99% 98% 95%  Weight:   (!) 141.7 kg   Height:        Constitutional:  Normal appearance.   HENT: Head Normocephalic and atraumatic.  Mucous membranes are moist.  Eyes:  Extraocular intact. Conjunctivae normal.  Cardiovascular: Rate and Rhythm: Normal rate and regular rhythm.  Pulmonary: Non labored, symmetric rise of chest wall.  Skin: Leg wounds are freshly dressed, C/D/I Neurological: No focal deficit present. alert. Oriented.  Psychiatric: Mood and Affect congruent.    The results of significant diagnostics from this hospitalization (including imaging, microbiology, ancillary and laboratory) are listed below for reference.     Microbiology: No results found for this or any previous visit (from the past 240 hours).   Labs: BNP (last 3 results) Recent Labs    09/04/23 1403 09/25/23 2328 02/16/24 0542  BNP 83.9 120.5* 128.9*   Basic  Metabolic Panel: Recent Labs  Lab 04/23/24 1114 04/23/24 1747 04/24/24 0447  NA 139  --  140  K 5.1  --  4.7  CL 110  --  109  CO2 22  --  22  GLUCOSE 150*  --  118*  BUN 23  --  25*  CREATININE 1.79*  --  1.50*  CALCIUM  8.3*  --  8.0*  MG  --  2.1  --    Liver Function Tests: No results for input(s): AST, ALT, ALKPHOS, BILITOT, PROT, ALBUMIN in the last 168 hours. No results for input(s): LIPASE, AMYLASE in the last 168 hours. No results for input(s): AMMONIA in the last 168 hours. CBC: Recent Labs  Lab 04/23/24 1114 04/23/24 1747 04/24/24 0447  WBC 7.6  --  6.3  NEUTROABS 5.0  --   --   HGB 7.1* 8.1* 7.7*  HCT 25.2* 28.0* 26.0*  MCV 86.0  --  84.1  PLT 471*  --  423*   Cardiac Enzymes: No results for input(s): CKTOTAL, CKMB, CKMBINDEX, TROPONINI in the last 168 hours. BNP: Invalid input(s): POCBNP CBG: Recent Labs  Lab 04/23/24 2309 04/24/24 0725 04/24/24 1136  GLUCAP 167* 101* 132*   D-Dimer No results for input(s): DDIMER in the last 72 hours. Hgb A1c Recent Labs    04/23/24 1114  HGBA1C 5.6   Lipid Profile No results for input(s): CHOL, HDL, LDLCALC, TRIG, CHOLHDL, LDLDIRECT in the last 72 hours. Thyroid  function studies No results for input(s): TSH, T4TOTAL, T3FREE, THYROIDAB in the last 72 hours.  Invalid input(s): FREET3 Anemia work up Recent Labs    04/23/24 1114 04/23/24 1217 04/23/24 1747  VITAMINB12  --   --  236  FOLATE  --   --  6.5  FERRITIN  --  48  --   TIBC  --  279  --   IRON   --  41  --   RETICCTPCT 3.7*  --   --    Urinalysis    Component Value Date/Time   COLORURINE YELLOW (A) 02/13/2024 1226   APPEARANCEUR HAZY (A) 02/13/2024 1226   LABSPEC 1.011 02/13/2024 1226   PHURINE 6.0 02/13/2024 1226   GLUCOSEU NEGATIVE 02/13/2024 1226   HGBUR NEGATIVE 02/13/2024 1226   BILIRUBINUR NEGATIVE 02/13/2024 1226   BILIRUBINUR negative 12/22/2014 0846   KETONESUR NEGATIVE  02/13/2024 1226   PROTEINUR 30 (A) 02/13/2024 1226   UROBILINOGEN 0.2 12/22/2014 0846   NITRITE NEGATIVE 02/13/2024 1226   LEUKOCYTESUR NEGATIVE 02/13/2024 1226   Sepsis Labs Recent Labs  Lab 04/23/24 1114 04/24/24 0447  WBC 7.6 6.3   Microbiology No results found for this or any previous visit (from the past 240 hours).   Time coordinating discharge: 32 min   SIGNED: Megan Presti, DO [facility] 04/24/2024, 2:50 PM Pager   If 7PM-7AM, please contact night-coverage

## 2024-04-24 NOTE — Progress Notes (Signed)
 Discharge report called to RN at nursing facility.

## 2024-04-24 NOTE — Plan of Care (Signed)

## 2024-04-24 NOTE — Hospital Course (Signed)
 [name] is a 68 year old female with hypertension, hyperlipidemia, type 2 diabetes, CKD stage IIIb, bedbound with chronic leg wounds, class III obesity BMI 53, who presents from her facility due to abnormal lab work.  On arrival to the ED patient's hemoglobin was 7.1 (facility reported hemoglobin of 6.3), iron  studies were obtained and patient was given 1 unit PRBC.

## 2024-04-25 DIAGNOSIS — K5900 Constipation, unspecified: Secondary | ICD-10-CM | POA: Diagnosis not present

## 2024-04-25 DIAGNOSIS — N184 Chronic kidney disease, stage 4 (severe): Secondary | ICD-10-CM | POA: Diagnosis not present

## 2024-04-25 DIAGNOSIS — I509 Heart failure, unspecified: Secondary | ICD-10-CM | POA: Diagnosis not present

## 2024-04-25 DIAGNOSIS — D649 Anemia, unspecified: Secondary | ICD-10-CM | POA: Diagnosis not present

## 2024-04-25 DIAGNOSIS — E11628 Type 2 diabetes mellitus with other skin complications: Secondary | ICD-10-CM | POA: Diagnosis not present

## 2024-04-25 DIAGNOSIS — I10 Essential (primary) hypertension: Secondary | ICD-10-CM | POA: Diagnosis not present

## 2024-04-25 DIAGNOSIS — E039 Hypothyroidism, unspecified: Secondary | ICD-10-CM | POA: Diagnosis not present

## 2024-04-25 DIAGNOSIS — G8929 Other chronic pain: Secondary | ICD-10-CM | POA: Diagnosis not present

## 2024-04-25 LAB — ERYTHROPOIETIN: Erythropoietin: 61.6 m[IU]/mL — ABNORMAL HIGH (ref 2.6–18.5)

## 2024-04-29 DIAGNOSIS — G8929 Other chronic pain: Secondary | ICD-10-CM | POA: Diagnosis not present

## 2024-04-30 DIAGNOSIS — Z7189 Other specified counseling: Secondary | ICD-10-CM | POA: Diagnosis not present

## 2024-05-01 DIAGNOSIS — E11628 Type 2 diabetes mellitus with other skin complications: Secondary | ICD-10-CM | POA: Diagnosis not present

## 2024-05-01 DIAGNOSIS — N184 Chronic kidney disease, stage 4 (severe): Secondary | ICD-10-CM | POA: Diagnosis not present

## 2024-05-01 DIAGNOSIS — I509 Heart failure, unspecified: Secondary | ICD-10-CM | POA: Diagnosis not present

## 2024-05-01 DIAGNOSIS — S81802D Unspecified open wound, left lower leg, subsequent encounter: Secondary | ICD-10-CM | POA: Diagnosis not present

## 2024-05-01 DIAGNOSIS — K5900 Constipation, unspecified: Secondary | ICD-10-CM | POA: Diagnosis not present

## 2024-05-01 DIAGNOSIS — I739 Peripheral vascular disease, unspecified: Secondary | ICD-10-CM | POA: Diagnosis not present

## 2024-05-01 DIAGNOSIS — E039 Hypothyroidism, unspecified: Secondary | ICD-10-CM | POA: Diagnosis not present

## 2024-05-01 DIAGNOSIS — D649 Anemia, unspecified: Secondary | ICD-10-CM | POA: Diagnosis not present

## 2024-05-01 DIAGNOSIS — G8929 Other chronic pain: Secondary | ICD-10-CM | POA: Diagnosis not present

## 2024-05-01 DIAGNOSIS — S81801D Unspecified open wound, right lower leg, subsequent encounter: Secondary | ICD-10-CM | POA: Diagnosis not present

## 2024-05-02 DIAGNOSIS — L03113 Cellulitis of right upper limb: Secondary | ICD-10-CM | POA: Diagnosis not present

## 2024-05-03 DIAGNOSIS — J309 Allergic rhinitis, unspecified: Secondary | ICD-10-CM | POA: Diagnosis not present

## 2024-05-03 DIAGNOSIS — G8929 Other chronic pain: Secondary | ICD-10-CM | POA: Diagnosis not present

## 2024-05-07 DIAGNOSIS — L602 Onychogryphosis: Secondary | ICD-10-CM | POA: Diagnosis not present

## 2024-05-07 DIAGNOSIS — Z794 Long term (current) use of insulin: Secondary | ICD-10-CM | POA: Diagnosis not present

## 2024-05-07 DIAGNOSIS — L03116 Cellulitis of left lower limb: Secondary | ICD-10-CM | POA: Diagnosis not present

## 2024-05-07 DIAGNOSIS — E1151 Type 2 diabetes mellitus with diabetic peripheral angiopathy without gangrene: Secondary | ICD-10-CM | POA: Diagnosis not present

## 2024-05-07 DIAGNOSIS — L03113 Cellulitis of right upper limb: Secondary | ICD-10-CM | POA: Diagnosis not present

## 2024-05-10 DIAGNOSIS — L03116 Cellulitis of left lower limb: Secondary | ICD-10-CM | POA: Diagnosis not present

## 2024-05-10 DIAGNOSIS — L03113 Cellulitis of right upper limb: Secondary | ICD-10-CM | POA: Diagnosis not present

## 2024-05-10 DIAGNOSIS — I890 Lymphedema, not elsewhere classified: Secondary | ICD-10-CM | POA: Diagnosis not present

## 2024-05-10 DIAGNOSIS — E1169 Type 2 diabetes mellitus with other specified complication: Secondary | ICD-10-CM | POA: Diagnosis not present

## 2024-05-10 DIAGNOSIS — R682 Dry mouth, unspecified: Secondary | ICD-10-CM | POA: Diagnosis not present

## 2024-05-13 DIAGNOSIS — L03116 Cellulitis of left lower limb: Secondary | ICD-10-CM | POA: Diagnosis not present

## 2024-05-13 DIAGNOSIS — L03113 Cellulitis of right upper limb: Secondary | ICD-10-CM | POA: Diagnosis not present

## 2024-05-20 DIAGNOSIS — D649 Anemia, unspecified: Secondary | ICD-10-CM | POA: Diagnosis not present

## 2024-05-20 DIAGNOSIS — E1169 Type 2 diabetes mellitus with other specified complication: Secondary | ICD-10-CM | POA: Diagnosis not present

## 2024-05-20 DIAGNOSIS — E559 Vitamin D deficiency, unspecified: Secondary | ICD-10-CM | POA: Diagnosis not present

## 2024-05-20 DIAGNOSIS — I890 Lymphedema, not elsewhere classified: Secondary | ICD-10-CM | POA: Diagnosis not present

## 2024-05-24 DIAGNOSIS — E11628 Type 2 diabetes mellitus with other skin complications: Secondary | ICD-10-CM | POA: Diagnosis not present

## 2024-05-24 DIAGNOSIS — G8929 Other chronic pain: Secondary | ICD-10-CM | POA: Diagnosis not present

## 2024-05-24 DIAGNOSIS — E559 Vitamin D deficiency, unspecified: Secondary | ICD-10-CM | POA: Diagnosis not present

## 2024-05-24 DIAGNOSIS — I890 Lymphedema, not elsewhere classified: Secondary | ICD-10-CM | POA: Diagnosis not present

## 2024-05-24 DIAGNOSIS — K5900 Constipation, unspecified: Secondary | ICD-10-CM | POA: Diagnosis not present

## 2024-05-24 DIAGNOSIS — E039 Hypothyroidism, unspecified: Secondary | ICD-10-CM | POA: Diagnosis not present

## 2024-05-24 DIAGNOSIS — D649 Anemia, unspecified: Secondary | ICD-10-CM | POA: Diagnosis not present

## 2024-05-24 DIAGNOSIS — I10 Essential (primary) hypertension: Secondary | ICD-10-CM | POA: Diagnosis not present

## 2024-05-28 DIAGNOSIS — I130 Hypertensive heart and chronic kidney disease with heart failure and stage 1 through stage 4 chronic kidney disease, or unspecified chronic kidney disease: Secondary | ICD-10-CM | POA: Diagnosis not present

## 2024-05-28 DIAGNOSIS — E559 Vitamin D deficiency, unspecified: Secondary | ICD-10-CM | POA: Diagnosis not present

## 2024-05-28 DIAGNOSIS — E1169 Type 2 diabetes mellitus with other specified complication: Secondary | ICD-10-CM | POA: Diagnosis not present

## 2024-05-28 DIAGNOSIS — I890 Lymphedema, not elsewhere classified: Secondary | ICD-10-CM | POA: Diagnosis not present

## 2024-05-29 DIAGNOSIS — F411 Generalized anxiety disorder: Secondary | ICD-10-CM | POA: Diagnosis not present

## 2024-05-29 DIAGNOSIS — L03116 Cellulitis of left lower limb: Secondary | ICD-10-CM | POA: Diagnosis not present

## 2024-05-29 DIAGNOSIS — N184 Chronic kidney disease, stage 4 (severe): Secondary | ICD-10-CM | POA: Diagnosis not present

## 2024-05-29 DIAGNOSIS — I130 Hypertensive heart and chronic kidney disease with heart failure and stage 1 through stage 4 chronic kidney disease, or unspecified chronic kidney disease: Secondary | ICD-10-CM | POA: Diagnosis not present

## 2024-05-29 DIAGNOSIS — I890 Lymphedema, not elsewhere classified: Secondary | ICD-10-CM | POA: Diagnosis not present

## 2024-05-29 DIAGNOSIS — L03113 Cellulitis of right upper limb: Secondary | ICD-10-CM | POA: Diagnosis not present

## 2024-05-29 DIAGNOSIS — E1169 Type 2 diabetes mellitus with other specified complication: Secondary | ICD-10-CM | POA: Diagnosis not present

## 2024-05-29 DIAGNOSIS — R682 Dry mouth, unspecified: Secondary | ICD-10-CM | POA: Diagnosis not present

## 2024-05-29 DIAGNOSIS — F330 Major depressive disorder, recurrent, mild: Secondary | ICD-10-CM | POA: Diagnosis not present

## 2024-05-31 DIAGNOSIS — I130 Hypertensive heart and chronic kidney disease with heart failure and stage 1 through stage 4 chronic kidney disease, or unspecified chronic kidney disease: Secondary | ICD-10-CM | POA: Diagnosis not present

## 2024-05-31 DIAGNOSIS — R682 Dry mouth, unspecified: Secondary | ICD-10-CM | POA: Diagnosis not present

## 2024-05-31 DIAGNOSIS — E1169 Type 2 diabetes mellitus with other specified complication: Secondary | ICD-10-CM | POA: Diagnosis not present

## 2024-05-31 DIAGNOSIS — I890 Lymphedema, not elsewhere classified: Secondary | ICD-10-CM | POA: Diagnosis not present

## 2024-06-03 DIAGNOSIS — G8929 Other chronic pain: Secondary | ICD-10-CM | POA: Diagnosis not present

## 2024-06-04 DIAGNOSIS — R682 Dry mouth, unspecified: Secondary | ICD-10-CM | POA: Diagnosis not present

## 2024-06-04 DIAGNOSIS — I890 Lymphedema, not elsewhere classified: Secondary | ICD-10-CM | POA: Diagnosis not present

## 2024-06-04 DIAGNOSIS — E1169 Type 2 diabetes mellitus with other specified complication: Secondary | ICD-10-CM | POA: Diagnosis not present

## 2024-06-04 DIAGNOSIS — M1612 Unilateral primary osteoarthritis, left hip: Secondary | ICD-10-CM | POA: Diagnosis not present

## 2024-06-04 DIAGNOSIS — I130 Hypertensive heart and chronic kidney disease with heart failure and stage 1 through stage 4 chronic kidney disease, or unspecified chronic kidney disease: Secondary | ICD-10-CM | POA: Diagnosis not present

## 2024-06-05 DIAGNOSIS — I130 Hypertensive heart and chronic kidney disease with heart failure and stage 1 through stage 4 chronic kidney disease, or unspecified chronic kidney disease: Secondary | ICD-10-CM | POA: Diagnosis not present

## 2024-06-05 DIAGNOSIS — E1169 Type 2 diabetes mellitus with other specified complication: Secondary | ICD-10-CM | POA: Diagnosis not present

## 2024-06-05 DIAGNOSIS — L03113 Cellulitis of right upper limb: Secondary | ICD-10-CM | POA: Diagnosis not present

## 2024-06-05 DIAGNOSIS — I890 Lymphedema, not elsewhere classified: Secondary | ICD-10-CM | POA: Diagnosis not present

## 2024-06-05 DIAGNOSIS — S71109A Unspecified open wound, unspecified thigh, initial encounter: Secondary | ICD-10-CM | POA: Diagnosis not present

## 2024-06-05 DIAGNOSIS — E559 Vitamin D deficiency, unspecified: Secondary | ICD-10-CM | POA: Diagnosis not present

## 2024-06-12 DIAGNOSIS — E1169 Type 2 diabetes mellitus with other specified complication: Secondary | ICD-10-CM | POA: Diagnosis not present

## 2024-06-12 DIAGNOSIS — E559 Vitamin D deficiency, unspecified: Secondary | ICD-10-CM | POA: Diagnosis not present

## 2024-06-12 DIAGNOSIS — I130 Hypertensive heart and chronic kidney disease with heart failure and stage 1 through stage 4 chronic kidney disease, or unspecified chronic kidney disease: Secondary | ICD-10-CM | POA: Diagnosis not present

## 2024-06-12 DIAGNOSIS — I890 Lymphedema, not elsewhere classified: Secondary | ICD-10-CM | POA: Diagnosis not present

## 2024-06-13 DIAGNOSIS — D649 Anemia, unspecified: Secondary | ICD-10-CM | POA: Diagnosis not present

## 2024-06-13 DIAGNOSIS — L97129 Non-pressure chronic ulcer of left thigh with unspecified severity: Secondary | ICD-10-CM | POA: Diagnosis not present

## 2024-06-13 DIAGNOSIS — L97929 Non-pressure chronic ulcer of unspecified part of left lower leg with unspecified severity: Secondary | ICD-10-CM | POA: Diagnosis not present

## 2024-06-13 DIAGNOSIS — L97919 Non-pressure chronic ulcer of unspecified part of right lower leg with unspecified severity: Secondary | ICD-10-CM | POA: Diagnosis not present

## 2024-07-31 ENCOUNTER — Encounter: Admitting: Physician Assistant

## 2024-08-14 ENCOUNTER — Encounter: Admitting: Physician Assistant
